# Patient Record
Sex: Female | Born: 1955 | Race: White | Hispanic: No | Marital: Married | Smoking: Never smoker
Health system: Southern US, Community
[De-identification: ages and names within clinical notes are randomized; demographics above are authoritative.]

## PROBLEM LIST (undated history)

## (undated) DIAGNOSIS — C50919 Malignant neoplasm of unspecified site of unspecified female breast: Secondary | ICD-10-CM

## (undated) DIAGNOSIS — R079 Chest pain, unspecified: Secondary | ICD-10-CM

## (undated) DIAGNOSIS — J45909 Unspecified asthma, uncomplicated: Secondary | ICD-10-CM

## (undated) DIAGNOSIS — Z86718 Personal history of other venous thrombosis and embolism: Secondary | ICD-10-CM

## (undated) DIAGNOSIS — Z98811 Dental restoration status: Secondary | ICD-10-CM

## (undated) DIAGNOSIS — Z973 Presence of spectacles and contact lenses: Secondary | ICD-10-CM

## (undated) DIAGNOSIS — R7303 Prediabetes: Secondary | ICD-10-CM

## (undated) DIAGNOSIS — M797 Fibromyalgia: Secondary | ICD-10-CM

## (undated) DIAGNOSIS — E669 Obesity, unspecified: Secondary | ICD-10-CM

## (undated) DIAGNOSIS — E559 Vitamin D deficiency, unspecified: Secondary | ICD-10-CM

## (undated) DIAGNOSIS — G4733 Obstructive sleep apnea (adult) (pediatric): Secondary | ICD-10-CM

## (undated) DIAGNOSIS — Z923 Personal history of irradiation: Secondary | ICD-10-CM

## (undated) DIAGNOSIS — K8020 Calculus of gallbladder without cholecystitis without obstruction: Secondary | ICD-10-CM

## (undated) DIAGNOSIS — K829 Disease of gallbladder, unspecified: Secondary | ICD-10-CM

## (undated) DIAGNOSIS — T7840XA Allergy, unspecified, initial encounter: Secondary | ICD-10-CM

## (undated) DIAGNOSIS — F419 Anxiety disorder, unspecified: Secondary | ICD-10-CM

## (undated) DIAGNOSIS — M069 Rheumatoid arthritis, unspecified: Secondary | ICD-10-CM

## (undated) DIAGNOSIS — Z85850 Personal history of malignant neoplasm of thyroid: Secondary | ICD-10-CM

## (undated) DIAGNOSIS — M25472 Effusion, left ankle: Secondary | ICD-10-CM

## (undated) DIAGNOSIS — Z87898 Personal history of other specified conditions: Secondary | ICD-10-CM

## (undated) DIAGNOSIS — M1990 Unspecified osteoarthritis, unspecified site: Secondary | ICD-10-CM

## (undated) DIAGNOSIS — I890 Lymphedema, not elsewhere classified: Secondary | ICD-10-CM

## (undated) DIAGNOSIS — C4491 Basal cell carcinoma of skin, unspecified: Secondary | ICD-10-CM

## (undated) DIAGNOSIS — R42 Dizziness and giddiness: Secondary | ICD-10-CM

## (undated) DIAGNOSIS — E039 Hypothyroidism, unspecified: Secondary | ICD-10-CM

## (undated) DIAGNOSIS — S8992XA Unspecified injury of left lower leg, initial encounter: Secondary | ICD-10-CM

## (undated) DIAGNOSIS — Z974 Presence of external hearing-aid: Secondary | ICD-10-CM

## (undated) DIAGNOSIS — R002 Palpitations: Secondary | ICD-10-CM

## (undated) DIAGNOSIS — K769 Liver disease, unspecified: Secondary | ICD-10-CM

## (undated) DIAGNOSIS — H269 Unspecified cataract: Secondary | ICD-10-CM

## (undated) DIAGNOSIS — Z972 Presence of dental prosthetic device (complete) (partial): Secondary | ICD-10-CM

## (undated) DIAGNOSIS — D649 Anemia, unspecified: Secondary | ICD-10-CM

## (undated) DIAGNOSIS — G4730 Sleep apnea, unspecified: Secondary | ICD-10-CM

## (undated) DIAGNOSIS — Q185 Microstomia: Secondary | ICD-10-CM

## (undated) DIAGNOSIS — N390 Urinary tract infection, site not specified: Secondary | ICD-10-CM

## (undated) DIAGNOSIS — E785 Hyperlipidemia, unspecified: Secondary | ICD-10-CM

## (undated) DIAGNOSIS — C801 Malignant (primary) neoplasm, unspecified: Secondary | ICD-10-CM

## (undated) DIAGNOSIS — R0602 Shortness of breath: Secondary | ICD-10-CM

## (undated) DIAGNOSIS — M67971 Unspecified disorder of synovium and tendon, right ankle and foot: Secondary | ICD-10-CM

## (undated) DIAGNOSIS — Z91018 Allergy to other foods: Secondary | ICD-10-CM

## (undated) DIAGNOSIS — I10 Essential (primary) hypertension: Secondary | ICD-10-CM

## (undated) DIAGNOSIS — Z8489 Family history of other specified conditions: Secondary | ICD-10-CM

## (undated) DIAGNOSIS — Z8719 Personal history of other diseases of the digestive system: Secondary | ICD-10-CM

## (undated) DIAGNOSIS — I509 Heart failure, unspecified: Secondary | ICD-10-CM

## (undated) DIAGNOSIS — H9319 Tinnitus, unspecified ear: Secondary | ICD-10-CM

## (undated) DIAGNOSIS — H409 Unspecified glaucoma: Secondary | ICD-10-CM

## (undated) DIAGNOSIS — G43909 Migraine, unspecified, not intractable, without status migrainosus: Secondary | ICD-10-CM

## (undated) DIAGNOSIS — J189 Pneumonia, unspecified organism: Secondary | ICD-10-CM

## (undated) DIAGNOSIS — F32A Depression, unspecified: Secondary | ICD-10-CM

## (undated) DIAGNOSIS — Z8711 Personal history of peptic ulcer disease: Secondary | ICD-10-CM

## (undated) DIAGNOSIS — K219 Gastro-esophageal reflux disease without esophagitis: Secondary | ICD-10-CM

## (undated) DIAGNOSIS — Z9221 Personal history of antineoplastic chemotherapy: Secondary | ICD-10-CM

## (undated) DIAGNOSIS — F329 Major depressive disorder, single episode, unspecified: Secondary | ICD-10-CM

## (undated) DIAGNOSIS — J449 Chronic obstructive pulmonary disease, unspecified: Secondary | ICD-10-CM

## (undated) DIAGNOSIS — K224 Dyskinesia of esophagus: Secondary | ICD-10-CM

## (undated) DIAGNOSIS — M25471 Effusion, right ankle: Secondary | ICD-10-CM

## (undated) DIAGNOSIS — M2550 Pain in unspecified joint: Secondary | ICD-10-CM

## (undated) HISTORY — PX: KNEE ARTHROSCOPY: SUR90

## (undated) HISTORY — DX: Gastro-esophageal reflux disease without esophagitis: K21.9

## (undated) HISTORY — DX: Depression, unspecified: F32.A

## (undated) HISTORY — DX: Shortness of breath: R06.02

## (undated) HISTORY — PX: TUMOR EXCISION: SHX421

## (undated) HISTORY — PX: BREAST LUMPECTOMY: SHX2

## (undated) HISTORY — DX: Unspecified osteoarthritis, unspecified site: M19.90

## (undated) HISTORY — DX: Personal history of peptic ulcer disease: Z87.11

## (undated) HISTORY — DX: Vitamin D deficiency, unspecified: E55.9

## (undated) HISTORY — DX: Effusion, left ankle: M25.472

## (undated) HISTORY — DX: Allergy, unspecified, initial encounter: T78.40XA

## (undated) HISTORY — DX: Unspecified glaucoma: H40.9

## (undated) HISTORY — PX: ACHILLES TENDON REPAIR: SUR1153

## (undated) HISTORY — PX: ORIF TOE FRACTURE: SHX5032

## (undated) HISTORY — DX: Unspecified disorder of synovium and tendon, right ankle and foot: M67.971

## (undated) HISTORY — DX: Tinnitus, unspecified ear: H93.19

## (undated) HISTORY — DX: Allergy to other foods: Z91.018

## (undated) HISTORY — DX: Calculus of gallbladder without cholecystitis without obstruction: K80.20

## (undated) HISTORY — DX: Rheumatoid arthritis, unspecified: M06.9

## (undated) HISTORY — DX: Anxiety disorder, unspecified: F41.9

## (undated) HISTORY — DX: Personal history of other diseases of the digestive system: Z87.19

## (undated) HISTORY — DX: Disease of gallbladder, unspecified: K82.9

## (undated) HISTORY — DX: Sleep apnea, unspecified: G47.30

## (undated) HISTORY — DX: Malignant (primary) neoplasm, unspecified: C80.1

## (undated) HISTORY — DX: Anemia, unspecified: D64.9

## (undated) HISTORY — PX: CARPAL TUNNEL RELEASE: SHX101

## (undated) HISTORY — PX: BREAST EXCISIONAL BIOPSY: SUR124

## (undated) HISTORY — DX: Personal history of other venous thrombosis and embolism: Z86.718

## (undated) HISTORY — DX: Pain in unspecified joint: M25.50

## (undated) HISTORY — PX: TUBAL LIGATION: SHX77

## (undated) HISTORY — DX: Lymphedema, not elsewhere classified: I89.0

## (undated) HISTORY — DX: Liver disease, unspecified: K76.9

## (undated) HISTORY — DX: Hyperlipidemia, unspecified: E78.5

## (undated) HISTORY — DX: Obstructive sleep apnea (adult) (pediatric): G47.33

## (undated) HISTORY — DX: Unspecified injury of left lower leg, initial encounter: S89.92XA

## (undated) HISTORY — DX: Unspecified asthma, uncomplicated: J45.909

## (undated) HISTORY — PX: LIGAMENT REPAIR: SHX5444

## (undated) HISTORY — DX: Basal cell carcinoma of skin, unspecified: C44.91

## (undated) HISTORY — DX: Unspecified cataract: H26.9

## (undated) HISTORY — DX: Major depressive disorder, single episode, unspecified: F32.9

## (undated) HISTORY — DX: Essential (primary) hypertension: I10

## (undated) HISTORY — DX: Prediabetes: R73.03

## (undated) HISTORY — DX: Palpitations: R00.2

## (undated) HISTORY — DX: Obesity, unspecified: E66.9

## (undated) HISTORY — DX: Chest pain, unspecified: R07.9

## (undated) HISTORY — DX: Chronic obstructive pulmonary disease, unspecified: J44.9

## (undated) HISTORY — DX: Heart failure, unspecified: I50.9

## (undated) HISTORY — DX: Effusion, right ankle: M25.471

---

## 1988-05-22 HISTORY — PX: CHOLECYSTECTOMY: SHX55

## 1998-05-22 HISTORY — PX: THYROIDECTOMY: SHX17

## 1998-05-22 HISTORY — PX: TONSILLECTOMY AND ADENOIDECTOMY: SHX28

## 2002-05-22 HISTORY — PX: INCONTINENCE SURGERY: SHX676

## 2002-05-22 HISTORY — PX: APPENDECTOMY: SHX54

## 2002-05-22 HISTORY — PX: ABDOMINAL HYSTERECTOMY: SHX81

## 2009-05-22 HISTORY — PX: BREAST SURGERY: SHX581

## 2009-06-22 HISTORY — PX: COLONOSCOPY W/ POLYPECTOMY: SHX1380

## 2009-08-27 LAB — HM COLONOSCOPY

## 2009-11-29 ENCOUNTER — Ambulatory Visit: Payer: Self-pay | Admitting: Diagnostic Radiology

## 2009-11-29 ENCOUNTER — Emergency Department (HOSPITAL_BASED_OUTPATIENT_CLINIC_OR_DEPARTMENT_OTHER): Admission: EM | Admit: 2009-11-29 | Discharge: 2009-11-29 | Payer: Self-pay | Admitting: Emergency Medicine

## 2009-12-01 ENCOUNTER — Emergency Department (HOSPITAL_BASED_OUTPATIENT_CLINIC_OR_DEPARTMENT_OTHER): Admission: EM | Admit: 2009-12-01 | Discharge: 2009-12-01 | Payer: Self-pay | Admitting: Emergency Medicine

## 2009-12-01 ENCOUNTER — Ambulatory Visit: Payer: Self-pay | Admitting: Family

## 2009-12-01 ENCOUNTER — Telehealth: Payer: Self-pay | Admitting: Family

## 2009-12-01 DIAGNOSIS — F329 Major depressive disorder, single episode, unspecified: Secondary | ICD-10-CM

## 2009-12-20 ENCOUNTER — Ambulatory Visit: Payer: Self-pay | Admitting: Family

## 2009-12-20 ENCOUNTER — Encounter (INDEPENDENT_AMBULATORY_CARE_PROVIDER_SITE_OTHER): Payer: Self-pay | Admitting: *Deleted

## 2009-12-20 DIAGNOSIS — E890 Postprocedural hypothyroidism: Secondary | ICD-10-CM

## 2009-12-20 DIAGNOSIS — E663 Overweight: Secondary | ICD-10-CM | POA: Insufficient documentation

## 2009-12-20 DIAGNOSIS — K219 Gastro-esophageal reflux disease without esophagitis: Secondary | ICD-10-CM

## 2009-12-21 ENCOUNTER — Encounter: Payer: Self-pay | Admitting: Family

## 2009-12-21 ENCOUNTER — Encounter (INDEPENDENT_AMBULATORY_CARE_PROVIDER_SITE_OTHER): Payer: Self-pay | Admitting: *Deleted

## 2009-12-21 LAB — CONVERTED CEMR LAB
ALT: 20 units/L (ref 0–35)
AST: 18 units/L (ref 0–37)
Alkaline Phosphatase: 56 units/L (ref 39–117)
Basophils Absolute: 0 10*3/uL (ref 0.0–0.1)
Bilirubin, Direct: 0.1 mg/dL (ref 0.0–0.3)
Calcium: 9.4 mg/dL (ref 8.4–10.5)
Cholesterol: 237 mg/dL — ABNORMAL HIGH (ref 0–200)
Creatinine, Ser: 0.73 mg/dL (ref 0.40–1.20)
Eosinophils Absolute: 0.1 10*3/uL (ref 0.0–0.7)
Eosinophils Relative: 2 % (ref 0–5)
Glucose, Bld: 96 mg/dL (ref 70–99)
HCT: 43.3 % (ref 36.0–46.0)
Lymphs Abs: 2.2 10*3/uL (ref 0.7–4.0)
MCV: 80.3 fL (ref 78.0–100.0)
Monocytes Absolute: 0.5 10*3/uL (ref 0.1–1.0)
Platelets: 275 10*3/uL (ref 150–400)
RDW: 15.4 % (ref 11.5–15.5)
Sodium: 139 meq/L (ref 135–145)
TSH: 0.765 microintl units/mL (ref 0.350–4.500)
Total CHOL/HDL Ratio: 3.5

## 2009-12-22 ENCOUNTER — Encounter: Payer: Self-pay | Admitting: Family

## 2010-01-05 ENCOUNTER — Encounter: Admission: RE | Admit: 2010-01-05 | Discharge: 2010-02-04 | Payer: Self-pay | Admitting: Orthopedic Surgery

## 2010-01-12 ENCOUNTER — Ambulatory Visit: Payer: Self-pay | Admitting: Family

## 2010-01-17 ENCOUNTER — Telehealth: Payer: Self-pay | Admitting: Family

## 2010-01-25 ENCOUNTER — Telehealth: Payer: Self-pay | Admitting: Family

## 2010-01-25 ENCOUNTER — Encounter: Payer: Self-pay | Admitting: Family

## 2010-03-14 ENCOUNTER — Ambulatory Visit: Payer: Self-pay | Admitting: Family

## 2010-03-16 ENCOUNTER — Telehealth: Payer: Self-pay | Admitting: Family

## 2010-03-22 ENCOUNTER — Ambulatory Visit: Payer: Self-pay | Admitting: Family

## 2010-03-22 ENCOUNTER — Ambulatory Visit: Payer: Self-pay | Admitting: Diagnostic Radiology

## 2010-03-22 ENCOUNTER — Ambulatory Visit (HOSPITAL_BASED_OUTPATIENT_CLINIC_OR_DEPARTMENT_OTHER): Admission: RE | Admit: 2010-03-22 | Discharge: 2010-03-22 | Payer: Self-pay | Admitting: Internal Medicine

## 2010-03-22 DIAGNOSIS — R609 Edema, unspecified: Secondary | ICD-10-CM

## 2010-03-22 DIAGNOSIS — J45909 Unspecified asthma, uncomplicated: Secondary | ICD-10-CM

## 2010-05-10 ENCOUNTER — Ambulatory Visit: Payer: Self-pay | Admitting: Family

## 2010-05-10 DIAGNOSIS — M76899 Other specified enthesopathies of unspecified lower limb, excluding foot: Secondary | ICD-10-CM | POA: Insufficient documentation

## 2010-05-10 DIAGNOSIS — M797 Fibromyalgia: Secondary | ICD-10-CM | POA: Insufficient documentation

## 2010-05-10 DIAGNOSIS — IMO0001 Reserved for inherently not codable concepts without codable children: Secondary | ICD-10-CM

## 2010-05-12 ENCOUNTER — Ambulatory Visit: Payer: Self-pay | Admitting: Family

## 2010-05-12 ENCOUNTER — Encounter: Payer: Self-pay | Admitting: Family Medicine

## 2010-06-06 ENCOUNTER — Telehealth: Payer: Self-pay | Admitting: Family

## 2010-06-21 NOTE — Assessment & Plan Note (Signed)
Summary: new pt right hip pain recent ER visit/dt--rm 4   Vital Signs:  Patient profile:   55 year old female Height:      67.75 inches Weight:      250.75 pounds BMI:     38.55 Temp:     97.8 degrees F oral Pulse rate:   72 / minute Pulse rhythm:   regular Resp:     16 per minute BP sitting:   128 / 84  (right arm) Cuff size:   large  Vitals Entered By: Mervin Kung CMA Duncan Dull) (December 01, 2009 1:14 PM) Is Patient Diabetic? No Pain Assessment Patient in pain? yes     Location: right pain Intensity: 8 Type: throbbing pain Onset of pain  11/29/09   History of Present Illness: Michelle Kennedy is a 55 year old female who presents today to establish care.  She was seen yesterday at the [facility] ED for severe R hip pain.   1) Hip pain- Patient notes that she was given an rx for  percocet in the ED which has brought pain down from 10+ to a 7 or 8.   x-ray was negative for fracture or bony changes. Pain is made worse by movement.  Improved some with percocet and a heating pad.  Reports that she has history of bursitis which was treated by her Orthopedist in [place] by steroid injection 4 months ago.  She had significant relief after this injection. She moved here 5 weeks ago.  2) Depression- Notes that her depression is better recently.  Prior to her recent move, her husband was unemployed for 2 yrs which place a lot of pressure on the patient.  Both of her children were [place] "in a war zone."         Preventive Screening-Counseling & Management  Alcohol-Tobacco     Smoking Status: never  Caffeine-Diet-Exercise     Does Patient Exercise: yes     Type of exercise: walking     Exercise (avg: min/session): 30-60     Times/week: 3      Drug Use:  no.    Allergies (verified): 1)  ! Iodine 2)  ! * Shellfish 3)  ! Pcn 4)  ! Erythromycin 5)  ! Codeine 6)  ! * Contrast Dye  Past History:  Past Medical  History: Asthma Arthritis Depression diverticulitis(colonoscopy)--06/2009 Migraines GERD Hx of Esophageal Ulcer HTN Allergies Hypercholesterolemia colon Polyps Thyroid Cancer--2000 Urinary Incontinence UTIs  Past Surgical History: Cholecystectomy--1990 Left Breast Biopsy--2001 Appendectomy--2004 Tonsillectomy/addenoidectomy--2000 hysterectomy & bladder sling--2004 Thyroidectomy--2000 colonscopy / polypectomy--06/2009  Family History: Alcoholism--parents, paternal grandfather Arthritis--mother, materna grandmother Ovarian Cancer--mother Breast Cancer--mother,deceased Lung Cancer--father,deceased Hypercholesterolemia--father HTN--mother Kidney Disease--father Diabetes--father, paternal grandmother Bipolar--mother  Social History: 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 No apparent alopecia or balding. Lungs:  Normal respiratory effort, chest expands symmetrically. Lungs are clear to auscultation, no crackles or wheezes. Heart:  Normal rate and regular rhythm. S1 and S2 normal without gallop, murmur, click, rub or other extra sounds. Msk:  + full range of motion of right hip, pain most severe with r hip flexion   Impression & Recommendations:  Problem # 1:  BURSITIS,  RIGHT HIP (ICD-726.5) Assessment Deteriorated Referral made to [facility] and arrangements were made for the patient to see them this afternoon for evaluation.  Recommended Aleve as needed.  Pt to return fasting next visit for a full physical.  Problem # 2:  DEPRESSION (ICD-311) Assessment: Improved Pt reports that her  depression has improved since some of the stress in her life has improved.  Not currently on meds.   Monitor.  Complete Medication List: 1)  Calcium 500 Mg Tabs (Calcium) .... Take 1 tablet by mouth two times a day 2)  Lyrica 75 Mg Caps (Pregabalin) .... Take 1 capsule by mouth two times a day 3)  Advair Diskus 250-50 Mcg/dose Aepb (Fluticasone-salmeterol) .Marland Kitchen.. 1 inhalation twice a day 4)  Albuterol Inhaler  .... As needed. 5)  Levothyroxine Sodium 200 Mcg Solr (Levothyroxine sodium) .... Take 1 tablet by mouth once a day 6)  Simvastatin 20 Mg Tabs (Simvastatin) .... Take 1 tablet by mouth once a day 7)  Epi Pen  .... As needed 8)  Loratadine 10 Mg Tabs (Loratadine) .... As needed 9)  Zolpidem Tartrate 5 Mg Tabs (Zolpidem tartrate) .... Take 1 tablet by mouth once a day  Patient Instructions: 1)  You will be contacted about your appointment with [facility] (ortho) on the second floor.  Their number is [phone] 2)  Please return fasting in 1 month for a complete physical.   3)  Welcome to [facility]!   Vital Signs:  Patient Profile:   55 year old female Height:     67.75 inches Weight:      250.75 pounds BMI:     38.55 Temp:     97.8 degrees F oral Pulse rate:   72 / minute Pulse rhythm:   regular Resp:     16 per minute BP sitting:   128 / 84 Cuff size:   large    Location:   right pain    Intensity:   8    Type:       throbbing pain                   Preventive Care Screening  Colonoscopy:    Date:  08/27/2009    Results:  Hyperplastic Polyp   Mammogram:    Date:  05/22/2009    Results:  normal   Pap Smear:    Date:  05/22/2009    Results:  normal   Last Tetanus Booster:    Date:  03/22/2009    Results:  Historical     Immunization History:  Pneumovax Immunization History:    Pneumovax:  historical (03/22/2009)   Current Allergies (reviewed today): ! IODINE ! * SHELLFISH ! PCN ! ERYTHROMYCIN ! CODEINE ! * CONTRAST DYE

## 2010-06-21 NOTE — Letter (Signed)
   [place] [facility] [address] [place], [place]  [address]  [place] Phone: [phone]      December 22, 2009   LAJOYA DOMBEK [address] [place], [place] [address]  RE:  LAB RESULTS  Dear  Ms. Basurto,  The following is an interpretation of your most recent lab tests.  Please take note of any instructions provided or changes to medications that have resulted from your lab work.  ELECTROLYTES:  Good - no changes needed  KIDNEY FUNCTION TESTS:  Good - no changes needed  LIVER FUNCTION TESTS:  Good - no changes needed  LIPID PANEL:  Please see the enclosed Rx for a change in medication Triglyceride: 101   Cholesterol: 237   LDL: 149   HDL: 68   Chol/HDL%:  3.5 Ratio  THYROID STUDIES:  Thyroid studies normal TSH: 0.765    Cholesterol is still a little high, I would like you to increase your simvastatin.  Please follow up in 3 months. Call if you develop muscle pain.  Prescription has been sent to your pharmacy.   Medications Prescribed or Changed SIMVASTATIN 40 MG TABS (SIMVASTATIN) one tablet by mouth daily   Sincerely Yours,    [name] FNP  Appended Document:  Mailed.

## 2010-06-21 NOTE — Progress Notes (Signed)
Summary: Rx refill to [facility]  Phone Note Refill Request Call back at Home Phone [phone] Message from:  Patient on January 17, 2010 4:05 PM  Refills Requested: Medication #1:  LYRICA 75 MG CAPS Take 1 capsule by mouth two times a day  Medication #2:  ADVAIR DISKUS 250-50 MCG/DOSE AEPB 1 inhalation twice a day  Medication #3:  ALBUTEROL INHALER as needed.  Medication #4:  LEVOTHYROXINE SODIUM 200 MCG SOLR Take 1 tablet by mouth once a day Pls send Rx to mail order [facility] Pls also  include  Loratadine, Moexipril, Cymbalta, Premarin, Tramadol, Zolpidem, Zomig,  Simvastatin  Initial call taken by: Lannette Donath,  January 17, 2010 4:06 PM  Follow-up for Phone Call        Is it ok to given 90 day supply of Tramadol, Lyrica and Zolpidem? Nicki Guadalajara Fergerson CMA Duncan Dull)  January 17, 2010 4:12 PM   Additional Follow-up for Phone Call Additional follow up Details #1::        No these are controlled substances.  By law I can only provide 30 days at a time with no refills on these meds. Patient is aware. [facility] [address]   Additional Follow-up by: [name] FNP,  January 17, 2010 4:29 PM    Additional Follow-up for Phone Call Additional follow up Details #2::    Tramadol, Lyrica and [facility]pidem [facility] [address] for [facility] day supply x no refills.  All other meds re-sent to [facility] via fax as electronic method failed. Nicki Guadalajara Fergerson CMA (AAMA)  January 18, 2010 8:19 AM   New/Updated Medications: LYRICA 75 MG CAPS (PREGABALIN) Take 1 capsule by mouth two times a daily as needed for pain ZOLPIDEM TARTRATE 10 MG TABS (ZOLPIDEM TARTRATE) to be taken at bedtime as needed for sleep TRAMADOL HCL 100 MG XR24H-TAB (TRAMADOL HCL) once daily as needed for pain VENTOLIN HFA 108 (90 BASE) MCG/ACT AERS (ALBUTEROL SULFATE) 2 puffs every 6 hours as needed Prescriptions: ALBUTEROL INHALER as needed.  #2 x 1   Entered by:   Mervin Kung CMA (AAMA)  Authorized by:   [name] FNP   Signed by:   Mervin Kung CMA (AAMA) on 01/18/2010   Method used:   Faxed to ...       [facility] [address] Environmental education officer)       [facility]       [address]       [place], [place]  [address]       Ph: [phone]       Fax: [phone]   RxID:   [id number] LORATADINE 10 MG TABS (LORATADINE) as needed  #90 x 1   Entered by:   Mervin Kung CMA (AAMA)   Authorized by:   [name] FNP   Signed by:   Mervin Kung CMA (AAMA) on 01/18/2010   Method used:   Faxed to ...       [facility] [address] Environmental education officer)       [facility]       [address]       [place], [place]  [address]       Ph: [phone]       Fax: [phone]   RxID:   [id number] SIMVASTATIN 40 MG TABS (SIMVASTATIN) one tablet by mouth daily  #90 x 1   Entered by:   Mervin Kung CMA (AAMA)   Authorized by:  [name] FNP   Signed by:   Mervin Kung CMA (AAMA) on 01/18/2010   Method used:   Faxed to ...       [facility] [address] Environmental education officer)       [facility]       [address]       [place], [place]  [address]       Ph: [phone]       Fax: [phone]   RxID:   [id number] ZOMIG 5 MG TABS (ZOLMITRIPTAN) as needed for migraines  #18 x 0   Entered by:   Mervin Kung CMA (AAMA)   Authorized by:   [name] FNP   Signed by:   Mervin Kung CMA (AAMA) on 01/18/2010   Method used:   Faxed to ...       [facility] [address] Environmental education officer)       [facility]       [address]       [place], [place]  [address]       Ph: [phone]       Fax: [phone]   RxID:   [id number] VENTOLIN HFA 108 (90 BASE) MCG/ACT AERS (ALBUTEROL SULFATE) 2 puffs every 6 hours as needed  #3 x 1   Entered by:   Mervin Kung CMA (AAMA)   Authorized by:   [name] FNP   Signed by:   Mervin Kung CMA (AAMA) on  01/18/2010   Method used:   Faxed to ...       [facility] [address] Environmental education officer)       [facility]       [address]       [place], [place]  [address]       Ph: [phone]       Fax: [phone]   RxID:   [id number] PREMARIN 0.625 MG TABS (ESTROGENS CONJUGATED) Take 1 tablet by mouth once a day  #90 x 1   Entered by:   Mervin Kung CMA (AAMA)   Authorized by:   [name] FNP   Signed by:   Mervin Kung CMA (AAMA) on 01/18/2010   Method used:   Faxed to ...       [facility] [address] Environmental education officer)       [facility]       [address]       [place], [place]  [address]       Ph: [phone]       Fax: [phone]   RxID:   [id number] CYMBALTA 60 MG CPEP (DULOXETINE HCL) Take 1 capsule by mouth once a day  #90 x 1   Entered by:   Mervin Kung CMA (AAMA)   Authorized by:   [name] FNP   Signed by:   Mervin Kung CMA (AAMA) on 01/18/2010   Method used:   Faxed to ...       [facility] [address] Environmental education officer)       [facility]       [address]       [place], [place]  [address]       Ph: [phone]       Fax: [phone]   RxID:   [id number] MOEXIPRIL HCL 15 MG TABS (MOEXIPRIL HCL) Take 1 tablet by mouth once a day  #90 x 1   Entered by:   Mervin Kung CMA (AAMA)  Authorized by:   [name] FNP   Signed by:   Mervin Kung CMA (AAMA) on 01/18/2010   Method used:   Faxed to ...       [facility] [address] Environmental education officer)       [facility]       [address]       [place], [place]  [address]       Ph: [phone]       Fax: [phone]   RxID:   [id number] LEVOTHYROXINE SODIUM 200 MCG SOLR (LEVOTHYROXINE SODIUM) Take 1 tablet by mouth once a day  #90 x 1   Entered by:   Mervin Kung CMA (AAMA)   Authorized by:   [name] FNP   Signed by:   Mervin Kung CMA (AAMA) on 01/18/2010   Method used:    Faxed to ...       [facility] [address] Environmental education officer)       [facility]       [address]       [place], [place]  [address]       Ph: [phone]       Fax: [phone]   RxID:   [id number] ADVAIR DISKUS 250-50 MCG/DOSE AEPB (FLUTICASONE-SALMETEROL) 1 inhalation twice a day  #3 x 1   Entered by:   Mervin Kung CMA (AAMA)   Authorized by:   [name] FNP   Signed by:   Mervin Kung CMA (AAMA) on 01/18/2010   Method used:   Faxed to ...       [facility] [address] Environmental education officer)       [facility]       [address]       [place], [place]  [address]       Ph: [phone]       Fax: [phone]   RxID:   [id number] VENTOLIN HFA 108 (90 BASE) MCG/ACT AERS (ALBUTEROL SULFATE) 2 puffs every 6 hours as needed  #3 x 1   Entered and Authorized by:   [name] FNP   Signed by:   [name] FNP on 01/17/2010   Method used:   Electronically to        [facility] ([facility])             , [place]         Ph: [phone]       Fax: [phone]   RxID:   [id number] ADVAIR DISKUS 250-50 MCG/DOSE AEPB (FLUTICASONE-SALMETEROL) 1 inhalation twice a day  #3 x 1   Entered and Authorized by:   [name] FNP   Signed by:   [name] FNP on 01/17/2010   Method used:   Electronically to        [facility] (mail[facility])             , [place]         Ph: [phone]       Fax: [phone]   RxID:   [id number] ZOMIG 5 MG TABS (ZOLMITRIPTAN) as needed for migraines  #18 x 0   Entered and Authorized by:   [name] FNP   Signed by:   [name] FNP on 01/17/2010   Method used:   Electronically to        [facility] (mail[facility])             , [place]  Ph: [phone]       Fax: [phone]   RxID:   [id number] PREMARIN 0.625 MG TABS (ESTROGENS CONJUGATED) Take 1 tablet by mouth once a day  #90 x 1   Entered and Authorized by:   [name] FNP   Signed by:   [name] FNP on 01/17/2010   Method used:   Electronically to        [facility] (mail[facility]order)             , [place]         Ph: [phone]       Fax: [phone]   RxID:   [id number] MOEXIPRIL HCL 15 MG TABS (MOEXIPRIL HCL) Take 1 tablet by mouth once a day  #90 x 1   Entered and Authorized by:   [name] FNP   Signed by:   [name] FNP on 01/17/2010   Method used:   Electronically to        [facility] (mail[facility]order)             , [place]         Ph: [phone]       Fax: [phone]   RxID:   [id number] CYMBALTA 60 MG CPEP (DULOXETINE HCL) Take 1 capsule by mouth once a day  #90 x 1   Entered and Authorized by:   [name] FNP   Signed by:   [name] FNP on 01/17/2010   Method used:   Electronically to        [facility] (mail[facility])             , [place]         Ph: [phone]       Fax: [phone]   RxID:   [id number] LEVOTHYROXINE SODIUM 200 MCG SOLR (LEVOTHYROXINE SODIUM) Take 1 tablet by mouth once a day  #90 x 1   Entered and Authorized by:   [name] FNP   Signed by:   [name] FNP on 01/17/2010   Method used:   Electronically to        [facility] (mail[facility]order)             , [place]         Ph: [phone]       Fax: [phone]   RxID:   [id number] LYRICA 75 MG CAPS (PREGABALIN) Take 1 capsule by mouth two times a daily as needed for pain  #60 x 0   Entered and Authorized by:   [name] FNP   Signed by:   [name] FNP on 01/17/2010   Method used:   Printed then faxed [facility] ...       [facility] [address] [phone][facility] (retail)       [address] [place], [place]  [address]       Ph: [phone]       Fax: [phone]   RxID:   [id number] ZOLPIDEM TARTRATE 10 MG TABS (ZOLPIDEM TARTRATE) to be taken at bedtime as needed for sleep  #30 x 0   Entered and Authorized by:   [name] FNP   Signed by:    [name] FNP on 01/17/2010   Method used:   Printed then faxed to ...       [facility] [address] [facility] [phone][facility] (retail)       [phone] [address]  [place], [place]  [address]       Ph: [phone]       Fax: [phone]   RxID:   [id number] TRAMADOL HCL 100 MG XR24H-TAB (TRAMADOL HCL) once daily as needed for pain  #30 x 0   Entered and Authorized by:   [name] FNP   Signed by:   [name] FNP on 01/17/2010   Method used:   Printed then faxed to ...       [facility] [address] (retail)       [address] [place], [place]  [address]       Ph: [phone]       Fax: [phone]   RxID:   [id number]

## 2010-06-21 NOTE — Assessment & Plan Note (Signed)
Summary: 3 MONTH FOLLOW UP/MHF--Rm 5   Vital Signs:  Patient profile:   55 year old female Height:      67.75 inches Weight:      255.25 pounds BMI:     39.24 O2 Sat:      99 % on Room air Temp:     97.4 degrees F oral Pulse rate:   60 / minute Pulse rhythm:   regular Resp:     18 per minute BP sitting:   120 / 90  (right arm) Cuff size:   large  Vitals Entered By: Mervin Kung CMA Duncan Dull) (March 22, 2010 10:38 AM)  O2 Flow:  Room air  Serial Vital Signs/Assessments:  Comments: 10:58 AM Ambulatory Pulse Oximetry  Resting; HR_78____    02 Sat___99__  Lap1 (185 feet)   HR__87___   02 Sat__97___ Lap2 (185 feet)   HR_____   02 Sat_____    Lap3 (185 feet)   HR_____   02 Sat_____  ___Test Completed without Difficulty Mervin Kung CMA (AAMA)  March 22, 2010 10:58 AM     By: Mervin Kung CMA (AAMA)   CC: Rm 5  3 month follow up. Pt has completed antibiotic. Still has chest tightness and sinus drainage. States O2 has been dropping to the 80s at night and when she is active. Is Patient Diabetic? No Pain Assessment Patient in pain? no      Comments Wants to know if she can take Biodentical Hormones to replace Premarin. Nicki Guadalajara Fergerson CMA Duncan Dull)  March 22, 2010 10:48 AM    Primary Care Provider:  [name] FNP  CC:  Rm 5  3 month follow up. Pt has completed antibiotic. Still has chest tightness and sinus drainage. States O2 has been dropping to the 80s at night and when she is active.Marland Kitchen  History of Present Illness: Michelle Kennedy is a 55 year old female who presents with complaint of hypoxia on saturday- saturday she noted wheezing.  Sats high 80's at rest.  Used the nebulizer.  Denies fever.  + chest congestion but cough is non-productive.  energy is fair.  Ear is feeling better.  Feels achey. Wonders if her FM is "kicking up." Chest feels tight with breathing.  She describes this tightness as a respiratory tightness. Denies current shortness of  breath or chest pain.   At the end of the visit, patient also tells me that she has had a 3 day history of LLE edema.    Allergies: 1)  ! Iodine 2)  ! * Shellfish 3)  ! Pcn 4)  ! Erythromycin 5)  ! Codeine 6)  ! * Contrast Dye 7)  ! Talwin 8)  ! Lidocaine 9)  ! Asa 10)  ! * Latex 11)  ! Sulfa 12)  ! * Bee Sting  Past History:  Past Medical History: Last updated: 12/01/2009 Asthma Arthritis Depression diverticulitis(colonoscopy)--06/2009 Migraines GERD Hx of Esophageal Ulcer HTN Allergies Hypercholesterolemia colon Polyps Thyroid Cancer--2000 Urinary Incontinence UTIs  Past Surgical History: Last updated: 12/01/2009 Cholecystectomy--1990 Left Breast Biopsy--2001 Appendectomy--2004 Tonsillectomy/addenoidectomy--2000 hysterectomy & bladder sling--2004 Thyroidectomy--2000 colonscopy / polypectomy--06/2009  Review of Systems       see HPI  Physical Exam  General:  Well-developed,well-nourished,in no acute distress; alert,appropriate and cooperative throughout examination Head:  Normocephalic and atraumatic without obvious abnormalities. No apparent alopecia or balding. Ears:  Bilateral serous effusions without bulging or erythema Lungs:  Normal respiratory effort, chest expands symmetrically. Lungs are clear to auscultation, no  crackles or wheezes. Heart:  Normal rate and regular rhythm. S1 and S2 normal without gallop, murmur, click, rub or other extra sounds. Extremities:  + swelling of LLE and left knee.  Swelling extends up left thigh   Impression & Recommendations:  Problem # 1:  ASTHMA (ICD-493.90) Assessment Improved Breathing has improved.  Sats stable today, even with ambulation.  CXR negative.   Her updated medication list for this problem includes:    Advair Diskus 250-50 Mcg/dose Aepb (Fluticasone-salmeterol) .Marland Kitchen... 1 inhalation twice a day    Ventolin Hfa 108 (90 Base) Mcg/act Aers (Albuterol sulfate) .Marland Kitchen... 2 puffs every 6 hours as needed     Albuterol Sulfate (5 Mg/ml) 0.5% Nebu (Albuterol sulfate) ..... One nebulizer every 6 hours as needed for wheezing  Problem # 2:  EDEMA (ICD-782.3) Assessment: New Patient with new LLE edema.  LLE doppler was performed and is negative for DVT.  D. Dimer is pending, but I suspect this will be WNL.   Orders: T-D-Dimer [facility]) [phone]) Misc. Referral (Misc. Ref)  Complete Medication List: 1)  Calcium 500 Mg Tabs (Calcium) .... Take 1 tablet by mouth two times a day 2)  Lyrica 75 Mg Caps (Pregabalin) .... Take 1 capsule by mouth two times a daily as needed for pain 3)  Advair Diskus 250-50 Mcg/dose Aepb (Fluticasone-salmeterol) .Marland Kitchen.. 1 inhalation twice a day 4)  Albuterol Inhaler  .... As needed. 5)  Levothyroxine Sodium 200 Mcg Solr (Levothyroxine sodium) .... Take 1 tablet by mouth once a day 6)  Simvastatin 40 Mg Tabs (Simvastatin) .... One tablet by mouth daily 7)  Epi Pen  .... As needed 8)  Loratadine 10 Mg Tabs (Loratadine) .... As needed 9)  Zolpidem Tartrate 10 Mg Tabs (Zolpidem tartrate) .... To be taken at bedtime as needed for sleep 10)  Fish Oil 1200 Mg Caps (Omega-3 fatty acids) .... Take 1 capsule by mouth once a day 11)  Daily Multi Tabs (Multiple vitamins-minerals) .... Take 1 tablet by mouth once a day 12)  Vitamin D 1000 Unit Tabs (Cholecalciferol) .... Take 1 tablet by mouth once a day 13)  Prilosec Otc 20 Mg Tbec (Omeprazole magnesium) .... Take 1 tablet by mouth two times a day 14)  Moexipril Hcl 15 Mg Tabs (Moexipril hcl) .... Take 1 tablet by mouth once a day 15)  Premarin 0.3 Mg Tabs (Estrogens conjugated) .... One tablet by mouth daily for 1 month then stop 16)  Tramadol Hcl 100 Mg Xr24h-tab (Tramadol hcl) .... Once daily as needed for pain 17)  Ventolin Hfa 108 (90 Base) Mcg/act Aers (Albuterol sulfate) .... 2 puffs every 6 hours as needed 18)  Naratriptan Hcl 2.5 Mg Tabs (Naratriptan hcl) .... One tablet at start of migraine, may repeat in 4 hours if symptoms  persist or headache returns. 19)  Albuterol Sulfate (5 Mg/ml) 0.5% Nebu (Albuterol sulfate) .... One nebulizer every 6 hours as needed for wheezing  Other Orders: CXR- 2view (CXR)  Patient Instructions: 1)  Please complete your x-ray, ultrasound and labs downstairs today.   Orders Added: 1)  CXR- 2view [CXR] 2)  T-D-Dimer [facility]) [[id number]] 3)  Misc. Referral [Misc. Ref] 4)  Est. Patient Level III [[address]]    Current Allergies (reviewed today): ! IODINE ! * SHELLFISH ! PCN ! ERYTHROMYCIN ! CODEINE ! * CONTRAST DYE ! TALWIN ! LIDOCAINE ! ASA ! * LATEX ! SULFA ! * BEE STING  Appended Document: 3 MONTH FOLLOW UP/MHF--Rm 5 Spoke with patient about coming  off of HRT.  Plan to decrease dose this month then discontinue.

## 2010-06-21 NOTE — Assessment & Plan Note (Signed)
Summary: ear ache/mhf--Rm 4   Vital Signs:  Patient profile:   54 year old female Height:      67.75 inches Temp:     98.0 degrees F oral Pulse rate:   84 / minute Pulse rhythm:   regular Resp:     18 per minute BP sitting:   126 / 84  (right arm) Cuff size:   large  Vitals Entered By: Mervin Kung CMA Duncan Dull) (March 14, 2010 8:37 AM) CC: Rm 4  Pt has left ear pain since Friday evening Is Patient Diabetic? No Pain Assessment Patient in pain? yes     Location: left ear Intensity: 7 Onset of pain  Friday evening Comments Pt agrees all med doses and directions are correct. Nicki Guadalajara Fergerson CMA Duncan Dull)  March 14, 2010 8:42 AM    Primary Care Provider:  [name] FNP  CC:  Rm 4  Pt has left ear pain since Friday evening.  History of Present Illness: Has dizziness, left ear pain for 3 days. Tried OTC drops and tylenol with very minimal improvement.  Feels irritable due to the pain.  Denies associated sore throat, nasal congestion, HA or fever.  Allergies: 1)  ! Iodine 2)  ! * Shellfish 3)  ! Pcn 4)  ! Erythromycin 5)  ! Codeine 6)  ! * Contrast Dye 7)  ! Talwin 8)  ! Lidocaine 9)  ! Asa 10)  ! * Latex 11)  ! Sulfa 12)  ! * Bee Sting  Past History:  Past Medical History: Last updated: 12/01/2009 Asthma Arthritis Depression diverticulitis(colonoscopy)--06/2009 Migraines GERD Hx of Esophageal Ulcer HTN Allergies Hypercholesterolemia colon Polyps Thyroid Cancer--2000 Urinary Incontinence UTIs  Past Surgical History: Last updated: 12/01/2009 Cholecystectomy--1990 Left Breast Biopsy--2001 Appendectomy--2004 Tonsillectomy/addenoidectomy--2000 hysterectomy & bladder sling--2004 Thyroidectomy--2000 colonscopy / polypectomy--06/2009  Review of Systems       see HPI  Physical Exam  General:  Uncomfortable appearing female, awake, alert and in NAD Head:  Normocephalic and atraumatic without obvious abnormalities. No apparent alopecia or  balding. Ears:  L TM dull with yellow opaque fluid noted behind TM. R TM with some clear fluid.  No erythema noted in either TM Mouth:  Oral mucosa and oropharynx without lesions or exudates.  Teeth in good repair. Lungs:  Normal respiratory effort, chest expands symmetrically. Lungs are clear to auscultation, no crackles or wheezes. Heart:  Normal rate and regular rhythm. S1 and S2 normal without gallop, murmur, click, rub or other extra sounds.   Impression & Recommendations:  Problem # 1:  OTITIS MEDIA, LEFT (ICD-382.9) Assessment New Multiple drug allergies.  Will plan to treat with Levaquin Her updated medication list for this problem includes:    Levaquin 500 Mg Tabs (Levofloxacin) ..... One tablet by mouth daily x 7 days  Complete Medication List: 1)  Calcium 500 Mg Tabs (Calcium) .... Take 1 tablet by mouth two times a day 2)  Lyrica 75 Mg Caps (Pregabalin) .... Take 1 capsule by mouth two times a daily as needed for pain 3)  Advair Diskus 250-50 Mcg/dose Aepb (Fluticasone-salmeterol) .Marland Kitchen.. 1 inhalation twice a day 4)  Albuterol Inhaler  .... As needed. 5)  Levothyroxine Sodium 200 Mcg Solr (Levothyroxine sodium) .... Take 1 tablet by mouth once a day 6)  Simvastatin 40 Mg Tabs (Simvastatin) .... One tablet by mouth daily 7)  Epi Pen  .... As needed 8)  Loratadine 10 Mg Tabs (Loratadine) .... As needed 9)  Zolpidem Tartrate 10 Mg Tabs (Zolpidem tartrate) .Marland KitchenMarland KitchenMarland Kitchen  To be taken at bedtime as needed for sleep 10)  Fish Oil 1200 Mg Caps (Omega-3 fatty acids) .... Take 1 capsule by mouth once a day 11)  Daily Multi Tabs (Multiple vitamins-minerals) .... Take 1 tablet by mouth once a day 12)  Vitamin D 1000 Unit Tabs (Cholecalciferol) .... Take 1 tablet by mouth once a day 13)  Prilosec Otc 20 Mg Tbec (Omeprazole magnesium) .... Take 1 tablet by mouth two times a day 14)  Moexipril Hcl 15 Mg Tabs (Moexipril hcl) .... Take 1 tablet by mouth once a day 15)  Premarin 0.625 Mg Tabs (Estrogens  conjugated) .... Take 1 tablet by mouth once a day 16)  Tramadol Hcl 100 Mg Xr24h-tab (Tramadol hcl) .... Once daily as needed for pain 17)  Ventolin Hfa 108 (90 Base) Mcg/act Aers (Albuterol sulfate) .... 2 puffs every 6 hours as needed 18)  Naratriptan Hcl 2.5 Mg Tabs (Naratriptan hcl) .... One tablet at start of migraine, may repeat in 4 hours if symptoms persist or headache returns. 19)  Levaquin 500 Mg Tabs (Levofloxacin) .... One tablet by mouth daily x 7 days  Patient Instructions: 1)  Call if symptoms worsen or do not improve.   Prescriptions: LEVAQUIN 500 MG TABS (LEVOFLOXACIN) one tablet by mouth daily x 7 days  #7 x 0   Entered and Authorized by:   [name] FNP   Signed by:   [name] FNP on 03/14/2010   Method used:   Electronically to        [facility] # [phone]* (retail)       [address] [place], [place]  [address]       Ph: [phone]       Fax: [phone]   RxID:   [id number]    Orders Added: 1)  Est. Patient Level III [27253]    Current Allergies (reviewed today): ! IODINE ! * SHELLFISH ! PCN ! ERYTHROMYCIN ! CODEINE ! * CONTRAST DYE ! TALWIN ! LIDOCAINE ! ASA ! * LATEX ! SULFA ! * BEE STING

## 2010-06-21 NOTE — Medication Information (Signed)
Summary: Step Therapy form for Cymbalta/[facility]  Step Therapy form for Cymbalta/[facility]   Imported By: Sherian Rein 02/09/2010 13:33:24  _____________________________________________________________________  External Attachment:    Type:   Image     Comment:   External Document

## 2010-06-21 NOTE — Letter (Signed)
Summary: Primary Care Consult Scheduled Letter  [facility]  [address]   [place], [place] [address]   Phone: [phone]  Fax: [phone]      12/21/2009 MRN: [id number]  Michelle Kennedy [address] [place], [place]  [address]    Dear Ms. Ashton,      We have scheduled an appointment for you.  At the recommendation of MELISSA O'SULLIVAN, we have scheduled you a consult with DR Talmage Nap, ENDOCRINOLOGY,[facility]  on SEPTEMBER 29,2011 at 9:30AM.  Their address is__[address] ,[place] . The office phone number is _[phone].  If this appointment day and time is not convenient for you, please feel free to call the office of the doctor you are being referred to at the number listed above and reschedule the appointment.     It is important for you to keep your scheduled appointments. We are here to make sure you are given good patient care. If you have questions or you have made changes to your appointment, please notify us at  [phone], ask for  HELEN.    Thank you,  Darral Dash Patient Care Coordinator [place] at [facility]

## 2010-06-21 NOTE — Progress Notes (Signed)
Summary: formulary alternatives  Phone Note Other Incoming   Caller: [facility]  ph)[phone]  fax) [phone] Summary of Call: Received fax from [facility] stating that Cymbalta and Zomig are not covered without prior auth and pt will need to complete step therapy. Forms forwarded to Provider to review possible alternatives. Member ID [id number].  Left message on [facility] voicemail that Michelle Kennedy is out of the office and it will be Monday before this is addressed. Michelle Kennedy CMA Duncan Dull)  January 25, 2010 5:07 PM   Follow-up for Phone Call        Left message for patient to return my call. Follow-up by: [name] FNP,  January 31, 2010 10:33 AM  Additional Follow-up for Phone Call Additional follow up Details #1::        Pt returned your call. She states that she thought the Lyrica replaced the Cymbalta. Pt has been taking Lyrica. Should she be taking both? Please advise. Michelle Kennedy CMA Duncan Dull)  February 01, 2010 1:45 PM     Additional Follow-up for Phone Call Additional follow up Details #2::    Called patient back.  She is only taking Lyrica- not on cymbalta.  Feeling well.  Will continue with Lyrica only. Pt is aware. Follow-up by: [name] FNP,  February 01, 2010 4:28 PM  New/Updated Medications: NARATRIPTAN HCL 2.5 MG TABS (NARATRIPTAN HCL) one tablet at start of migraine, may repeat in 4 hours if symptoms persist or headache returns. Prescriptions: NARATRIPTAN HCL 2.5 MG TABS (NARATRIPTAN HCL) one tablet at start of migraine, may repeat in 4 hours if symptoms persist or headache returns.  #6 x 2   Entered and Authorized by:   [name] FNP   Signed by:   [name] FNP on 02/01/2010   Method used:   Electronically to        [facility] # [phone]* (retail)       [address] [place], [place]  [address]       Ph: [phone]       Fax: [phone]   RxID:   [id number]

## 2010-06-21 NOTE — Assessment & Plan Note (Signed)
  Prescriptions: ADVAIR DISKUS 250-50 MCG/DOSE AEPB (FLUTICASONE-SALMETEROL) 1 inhalation twice a day  #1 x 3   Entered and Authorized by:   [name] FNP   Signed by:   [name] FNP on 03/14/2010   Method used:   Electronically to        [facility] # [phone]* (retail)       [address] [place], [place]  [address]       Ph: [phone]       Fax: [phone]   RxID:   [id number]

## 2010-06-21 NOTE — Progress Notes (Signed)
Summary: Pt getting worse  Phone Note Call from Patient Call back at [phone]   Reason for Call: Talk to Nurse Summary of Call: pt feels like she is getting worse since she started the antibiotic, pls advise Initial call taken by: Lannette Donath,  March 16, 2010 4:41 PM  Follow-up for Phone Call        Pt states as soon as she tries to get up and walk she has chest tightness, sob, ear still very painful.  Has been on abx. x 3 days and feels worse.  Please advise. Nicki Guadalajara Fergerson CMA Duncan Dull)  March 16, 2010 4:51 PM   Additional Follow-up for Phone Call Additional follow up Details #1::        Please call patient and arrange a follow up visit tomorrow.  She should go to ED if symptoms severe between now and her appointment. Additional Follow-up by: [name] FNP,  March 16, 2010 4:57 PM    Additional Follow-up for Phone Call Additional follow up Details #2::    Pt notified per Melissa's instructions and voices understanding. Appt scheduled for 03/17/10 with Dr Artist Pais at 2:15pm. Mervin Kung CMA Duncan Dull)  March 16, 2010 5:00 PM

## 2010-06-21 NOTE — Progress Notes (Signed)
Summary: [facility] appt  Phone Note From Other Clinic Call back at [phone]   Caller: Provider Summary of Call: SW Lynden Ang at [facility], they have contacted patient & patient is coming in this afternoon for an appt Initial call taken by: Lannette Donath,  December 01, 2009 3:11 PM

## 2010-06-21 NOTE — Assessment & Plan Note (Signed)
Summary: cpx/mhf Rsch per pt/dt--Rm 5   Vital Signs:  Patient profile:   55 year old female Height:      67.75 inches Weight:      245 pounds BMI:     37.66 Temp:     97.7 degrees F oral Pulse rate:   78 / minute Pulse rhythm:   regular Resp:     16 per minute BP sitting:   128 / 82  (right arm) Cuff size:   large  Vitals Entered By: Mervin Kung CMA Duncan Dull) (December 20, 2009 11:13 AM) CC: Room 5  Physical Is Patient Diabetic? No Comments Pt states Calcium was stopped due to stomach problems.  Nicki Guadalajara Fergerson CMA (AAMA)  December 20, 2009 10:30 AM    CC:  Room 5  Physical.  History of Present Illness: Ms Cavan is a 55 year old female who presents today for a complete physical. She reports that she is contemplating the "Owl Diet" which consists of 700 calories a day and use of HCG drops.  She has lost 6 pounds since her last visit.   Has been swimming.  She is up to date on her tetanus. She has been watching what she eats.  S/p total hysterectomy.  Last Mammo 2/1.  Last colo 4/11- had two polyps removed.  She reports + esophageal ulcer- her GI physician in [place] recommended that she increase her PPI- she reports that she was unable to follow up with him due to her recent relocation.    Allergies: 1)  ! Iodine 2)  ! * Shellfish 3)  ! Pcn 4)  ! Erythromycin 5)  ! Codeine 6)  ! * Contrast Dye 7)  ! Talwin 8)  ! Lidocaine 9)  ! Asa 10)  ! * Latex 11)  ! Sulfa 12)  ! * Bee Sting  Past History:  Past Medical History: Last updated: 12/01/2009 Asthma Arthritis Depression diverticulitis(colonoscopy)--06/2009 Migraines GERD Hx of Esophageal Ulcer HTN Allergies Hypercholesterolemia colon Polyps Thyroid Cancer--2000 Urinary Incontinence UTIs  Past Surgical History: Last updated: 12/01/2009 Cholecystectomy--1990 Left Breast Biopsy--2001 Appendectomy--2004 Tonsillectomy/addenoidectomy--2000 hysterectomy & bladder sling--2004 Thyroidectomy--2000 colonscopy /  polypectomy--06/2009  Family History: Last updated: 12/01/2009 Alcoholism--parents, paternal grandfather Arthritis--mother, materna grandmother Ovarian Cancer--mother Breast Cancer--mother,deceased Lung Cancer--father,deceased Hypercholesterolemia--father HTN--mother Kidney Disease--father Diabetes--father, paternal grandmother Bipolar--mother  Social History: Last updated: 12/01/2009 Occupation: student Married Never Smoked Alcohol use-no Drug use-no Regular exercise-yes  Risk Factors: Exercise: yes (12/01/2009)  Risk Factors: Smoking Status: never (12/01/2009)  Family History: Reviewed history from 12/01/2009 and no changes required. Alcoholism--parents, paternal grandfather Arthritis--mother, materna grandmother Ovarian Cancer--mother Breast Cancer--mother,deceased Lung Cancer--father,deceased Hypercholesterolemia--father HTN--mother Kidney Disease--father Diabetes--father, paternal grandmother Bipolar--mother  Social History: Reviewed history from 12/01/2009 and no changes required. 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 Resp: Denies cough CV:  Denies Chest Pain- occasional SOB due to asthma- but pt reports that this is at baseline. GI:  Denies nausea or vomitting GU: Denies dysuria Lymphatic: Denies lymphadenopathy Musculoskeletal:  + R hip pain and + left knee pain Skin:  Denies Rashes - + mole on her left thigh and on her back Psychiatric: Notes tearful at times. Neuro: Denies numbness     Physical Exam  General:  Well-developed,well-nourished,in no acute distress; alert,appropriate and cooperative throughout examination Head:  Normocephalic and atraumatic without obvious abnormalities. No apparent alopecia or balding. Eyes:  PERRLA Ears:  External ear exam shows no significant lesions or  deformities.  Otoscopic examination reveals clear canals, tympanic membranes are intact bilaterally without bulging, retraction, inflammation or discharge. Hearing is grossly normal bilaterally. Mouth:  Oral mucosa and oropharynx without lesions or exudates.  Teeth in good repair. Neck:  No deformities, masses, or tenderness noted. Breasts:  No mass, nodules, thickening, tenderness, bulging, retraction, inflamation, nipple discharge or skin changes noted.   Lungs:  Normal respiratory effort, chest expands symmetrically. Lungs are clear to auscultation, no crackles or wheezes. Heart:  Normal rate and regular rhythm. S1 and S2 normal without gallop, murmur, click, rub or other extra sounds. Abdomen:  Bowel sounds positive,abdomen soft and non-tender without masses, organomegaly or hernias noted. Genitalia:  not performed s/p TAH/BSO Msk:  No deformity or scoliosis noted of thoracic or lumbar spine.   Pulses:  R and L carotid,radial,femoral,dorsalis pedis and posterior tibial pulses are full and equal bilaterally Extremities:  No clubbing, cyanosis, edema, or deformity noted  Neurologic:  alert & oriented X3, cranial nerves II-XII intact, strength normal in all extremities, and gait normal.   Skin:  + pink raised lesion on left anterior thigh,  irregularly hyperpigmented lesion on back   Impression & Recommendations:  Problem # 1:  Preventive Health Care (ICD-V70.0) Assessment Comment Only Immunizations reviewed and up to date.  Up to date on Mammogram.  Will refer to GI to establish given her history of abnormal colonoscopy and esophageal ulcer.  Patient was counselled on healthy eating, diet and exercise. EKG performed today- NSR without acute changes.  Orders: TLB-BMP (Basic Metabolic Panel-BMET) (80048-METABOL) TLB-Hepatic/Liver Function Pnl (80076-HEPATIC) TLB-CBC Platelet - w/Differential (85025-CBCD) TLB-TSH (Thyroid Stimulating Hormone) (84443-TSH) TLB-Lipid Panel (80061-LIPID) EKG w/  Interpretation (93000)  Problem # 2:  OVERWEIGHT (ICD-278.02) Assessment: Improved Patient has lost 6 pounds since her last visit.  I advised her against the use of the "owl diet"- as 700 calories is not a healthy amount.   Also advised her that there is no solid evidence that the HCG supplements improve weight loss. Recommended that she try to stick to 1200 calories and continue her exercise program. Will refer to the weight loss center. Orders: Misc. Referral (Misc. Ref)  Problem # 3:  HYPOTHYROIDISM (ICD-244.9) Assessment: Unchanged Patient will return for TSH-  Will also refer to endocrinology- +history of thyroid cancer.   Her updated medication list for this problem includes:    Levothyroxine Sodium 200 Mcg Solr (Levothyroxine sodium) .Marland Kitchen... Take 1 tablet by mouth once a day  Orders: Endocrinology Referral (Endocrine)  Problem # 4:  SKIN LESIONS, MULTIPLE (ICD-709.9) Assessment: Comment Only Pt wishes to have these removed, recommended that she schedule a follow up apt for excision.  Problem # 5:  GERD (ICD-530.81) Assessment: Comment Only see #1 Her updated medication list for this problem includes:    Prilosec Otc 20 Mg Tbec (Omeprazole magnesium) .Marland Kitchen... Take 1 tablet by mouth two times a day  Orders: Gastroenterology Referral (GI)  Complete Medication List: 1)  Calcium 500 Mg Tabs (Calcium) .... Take 1 tablet by mouth two times a day 2)  Lyrica 75 Mg Caps (Pregabalin) .... Take 1 capsule by mouth two times a day 3)  Advair Diskus 250-50 Mcg/dose Aepb (Fluticasone-salmeterol) .Marland Kitchen.. 1 inhalation twice a day 4)  Albuterol Inhaler  .... As needed. 5)  Levothyroxine Sodium 200 Mcg Solr (Levothyroxine sodium) .... Take 1 tablet by mouth once a day 6)  Simvastatin 20 Mg Tabs (Simvastatin) .... Take 1  tablet by mouth once a day 7)  Epi Pen  .... As needed 8)  Loratadine 10 Mg Tabs (Loratadine) .... As needed 9)  Zolpidem Tartrate 10 Mg Tabs (Zolpidem tartrate) .... As needed for  sleep 10)  Fish Oil 1200 Mg Caps (Omega-3 fatty acids) .... Take 1 capsule by mouth once a day 11)  Daily Multi Tabs (Multiple vitamins-minerals) .... Take 1 tablet by mouth once a day 12)  Vitamin D 1000 Unit Tabs (Cholecalciferol) .... Take 1 tablet by mouth once a day 13)  Prilosec Otc 20 Mg Tbec (Omeprazole magnesium) .... Take 1 tablet by mouth two times a day 14)  Moexipril Hcl 15 Mg Tabs (Moexipril hcl) .... Take 1 tablet by mouth once a day 15)  Cymbalta 60 Mg Cpep (Duloxetine hcl) .... Take 1 capsule by mouth once a day 16)  Premarin 0.625 Mg Tabs (Estrogens conjugated) .... Take 1 tablet by mouth once a day 17)  Tramadol Hcl 100 Mg Xr24h-tab (Tramadol hcl) .... As needed for pain 18)  Ventolin Hfa 108 (90 Base) Mcg/act Aers (Albuterol sulfate) .... As needed 19)  Zomig 5 Mg Tabs (Zolmitriptan) .... As needed for migraines  Patient Instructions: 1)  You will be contacted about your referral to the weight loss clinic, Gastroenterology and Endocrinology. 2)  Please follow up in 3 months. 3)  Please return tomorrow AM for fasting lab work.    Current Allergies (reviewed today): ! IODINE ! * SHELLFISH ! PCN ! ERYTHROMYCIN ! CODEINE ! * CONTRAST DYE ! TALWIN ! LIDOCAINE ! ASA ! * LATEX ! SULFA ! * BEE STING

## 2010-06-21 NOTE — Letter (Signed)
Summary: New Patient letter  [facility]  [address] [place], [place] [address]   Phone: [phone]  Fax: [phone]       12/20/2009 MRN: [id number]  Michelle Kennedy [address] [place], [place]  [address]  Dear [name],  Welcome to the Gastroenterology Division at [facility].    You are scheduled to see Dr.  Christella Hartigan on 02-01-10 at 2:30p.m. on the 3rd floor at [facility], [address]  We ask that you try to arrive at our office 15 minutes prior to your appointment time to allow for check-in.  We would like you to complete the enclosed self-administered evaluation form prior to your visit and bring it with you on the day of your appointment.  We will review it with you.  Also, please bring a complete list of all your medications or, if you prefer, bring the medication bottles and we will list them.  Please bring your insurance card so that we may make a copy of it.  If your insurance requires a referral to see a specialist, please bring your referral form from your primary care physician.  Co-payments are due at the time of your visit and may be paid by cash, check or credit card.     Your office visit will consist of a consult with your physician (includes a physical exam), any laboratory testing he/she may order, scheduling of any necessary diagnostic testing (e.g. x-ray, ultrasound, CT-scan), and scheduling of a procedure (e.g. Endoscopy, Colonoscopy) if required.  Please allow enough time on your schedule to allow for any/all of these possibilities.    If you cannot keep your appointment, please call [phone] to cancel or reschedule prior to your appointment date.  This allows [place] the opportunity to schedule an appointment for another patient in need of care.  If you do not cancel or reschedule by 5 p.m. the business day prior to your appointment date, you will be charged a $50.00 late cancellation/no-show fee.    Thank you for choosing [facility] for your medical needs.  We appreciate the opportunity to care for you.  Please visit [place] at our website  to learn more about our practice.                     Sincerely,                                                             The Gastroenterology Division

## 2010-06-21 NOTE — Medication Information (Signed)
Summary: Step Therapy Request for Zomig/[facility]  Step Therapy Request for Zomig/[facility]   Imported By: Sherian Rein 02/09/2010 13:31:54  _____________________________________________________________________  External Attachment:    Type:   Image     Comment:   External Document

## 2010-06-22 LAB — HM DIABETES EYE EXAM

## 2010-06-23 NOTE — Letter (Signed)
Summary: Handicapped Placard/[facility]  Handicapped Placard/[facility]   Imported By: Lanelle Bal 05/20/2010 12:56:21  _____________________________________________________________________  External Attachment:    Type:   Image     Comment:   External Document

## 2010-06-23 NOTE — Progress Notes (Signed)
Summary: refill--lyrica  Phone Note Refill Request Message from:  Patient on June 06, 2010 1:24 PM  Refills Requested: Medication #1:  LYRICA 75 MG CAPS Take 1 capsule by mouth two times a daily as needed for pain   Dosage confirmed as above?Dosage Confirmed   Supply Requested: 1 month Pt last seen 12/11.  Next Appointment Scheduled: none Initial call taken by: Mervin Kung CMA Duncan Dull),  June 06, 2010 1:24 PM  Follow-up for Phone Call        Printed, please fax to pharmacy. Follow-up by: [name] FNP,  June 06, 2010 1:54 PM  Additional Follow-up for Phone Call Additional follow up Details #1::        Rx faxed to number below. Nicki Guadalajara Fergerson CMA Duncan Dull)  June 06, 2010 2:41 PM     Prescriptions: LYRICA 75 MG CAPS (PREGABALIN) Take 1 capsule by mouth two times a daily as needed for pain  #60 x 0   Entered and Authorized by:   [name] FNP   Signed by:   [name] FNP on 06/06/2010   Method used:   Printed then faxed to ...       [facility] [phone]* (retail)       [address]       [place], [place]  [address]       Ph: [phone]       Fax: [phone]   RxID:   [id number]

## 2010-06-23 NOTE — Assessment & Plan Note (Signed)
Summary: renewal handicap sticker/mhf--rm 5   Vital Signs:  Patient profile:   55 year old female Height:      67.75 inches Weight:      254 pounds BMI:     39.05 Temp:     97.6 degrees F oral Pulse rate:   78 / minute Pulse rhythm:   regular Resp:     18 per minute BP sitting:   126 / 90  (right arm) Cuff size:   large  Vitals Entered By: Mervin Kung CMA Duncan Dull) (May 10, 2010 1:30 PM) CC: Pt states she is having right hip pain; intermittent since last year. Would like something to help with the pain. Is Patient Diabetic? No Pain Assessment Patient in pain? yes     Location: right hip Intensity: 6-7 Onset of pain  last year Comments Pt agrees all med doses and directions are correct. Nicki Guadalajara Fergerson CMA Duncan Dull)  May 10, 2010 1:36 PM    Primary Care Provider:  [name] FNP  CC:  Pt states she is having right hip pain; intermittent since last year. Would like something to help with the pain.Marland Kitchen  History of Present Illness: Ms Demarco is a 55 year old female who presents with c/o right hip pain.  Had anaphylaxis to the steroid injection at [facility] helped for 2-3 months.  Chronic right hip pain at this time.  Heating pad helps.  Pain with walking and sitting.    Allergies: 1)  ! Iodine 2)  ! * Shellfish 3)  ! Pcn 4)  ! Erythromycin 5)  ! Codeine 6)  ! * Contrast Dye 7)  ! Talwin 8)  ! Lidocaine 9)  ! Asa 10)  ! * Latex 11)  ! Sulfa 12)  ! * Bee Sting  Past History:  Past Medical History: Last updated: 12/01/2009 Asthma Arthritis Depression diverticulitis(colonoscopy)--06/2009 Migraines GERD Hx of Esophageal Ulcer HTN Allergies Hypercholesterolemia colon Polyps Thyroid Cancer--2000 Urinary Incontinence UTIs  Past Surgical History: Last updated: 12/01/2009 Cholecystectomy--1990 Left Breast Biopsy--2001 Appendectomy--2004 Tonsillectomy/addenoidectomy--2000 hysterectomy & bladder sling--2004 Thyroidectomy--2000 colonscopy /  polypectomy--06/2009  Review of Systems       see HPI  Physical Exam  General:  Well-developed,well-nourished,in no acute distress; alert,appropriate and cooperative throughout examination Head:  Normocephalic and atraumatic without obvious abnormalities. No apparent alopecia or balding. Neck:  No deformities, masses, or tenderness noted. Lungs:  Normal respiratory effort, chest expands symmetrically. Lungs are clear to auscultation, no crackles or wheezes. Heart:  Normal rate and regular rhythm. S1 and S2 normal without gallop, murmur, click, rub or other extra sounds. Msk:  + pain with flexion and abduction of right hip.     Impression & Recommendations:  Problem # 1:  BURSITIS, RIGHT HIP (ICD-726.5) Assessment New  Pt is already on lyrica.  She had what she describes as an "anaphylactic" reaction to cortisone injection at [facility].  She has a reported history of remote ulcer- on PPI.  Will plan referral to [facility] for further evaluation/recommendation and recommended short term use of mobic for 1-2 weeks for inflammation/pain.  Handicap placard form was filled today due to her hip pain and FM.  Pt was counseled on importance of weight loss for joint pain.   Orders: Form Completion [id number]) Sports Medicine (Sports Med)  Complete Medication List: 1)  Calcium 500 Mg Tabs (Calcium) .... Take 1 tablet by mouth two times a day 2)  Lyrica 75 Mg Caps (Pregabalin) .... Take 1 capsule by mouth two times a daily  as needed for pain 3)  Advair Diskus 250-50 Mcg/dose Aepb (Fluticasone-salmeterol) .Marland Kitchen.. 1 inhalation twice a day 4)  Albuterol Inhaler  .... As needed. 5)  Levothyroxine Sodium 200 Mcg Solr (Levothyroxine sodium) .... Take 1 tablet by mouth once a day 6)  Simvastatin 40 Mg Tabs (Simvastatin) .... One tablet by mouth daily 7)  Epi Pen  .... As needed 8)  Loratadine 10 Mg Tabs (Loratadine) .... As needed 9)  Zolpidem Tartrate 10 Mg Tabs (Zolpidem tartrate) .... To be taken at bedtime  as needed for sleep 10)  Fish Oil 1200 Mg Caps (Omega-3 fatty acids) .... Take 1 capsule by mouth once a day 11)  Daily Multi Tabs (Multiple vitamins-minerals) .... Take 1 tablet by mouth once a day 12)  Vitamin D 1000 Unit Tabs (Cholecalciferol) .... Take 1 tablet by mouth once a day 13)  Prilosec Otc 20 Mg Tbec (Omeprazole magnesium) .... Take 1 tablet by mouth two times a day 14)  Moexipril Hcl 15 Mg Tabs (Moexipril hcl) .... Take 1 tablet by mouth once a day 15)  Premarin 0.3 Mg Tabs (Estrogens conjugated) .... One tablet by mouth daily for 1 month then stop 16)  Tramadol Hcl 100 Mg Xr24h-tab (Tramadol hcl) .... Once daily as needed for pain 17)  Ventolin Hfa 108 (90 Base) Mcg/act Aers (Albuterol sulfate) .... 2 puffs every 6 hours as needed 18)  Naratriptan Hcl 2.5 Mg Tabs (Naratriptan hcl) .... One tablet at start of migraine, may repeat in 4 hours if symptoms persist or headache returns. 19)  Albuterol Sulfate (5 Mg/ml) 0.5% Nebu (Albuterol sulfate) .... One nebulizer every 6 hours as needed for wheezing 20)  Omeprazole 20 Mg Cpdr (Omeprazole) .... One tab by mouth twice daily 21)  Mobic 7.5 Mg Tabs (Meloxicam) .... One tablet by mouth daily as needed for pain  Patient Instructions: 1)  You will be contacted about your referral to Dr. Pearletha Forge. Prescriptions: MOBIC 7.5 MG TABS (MELOXICAM) one tablet by mouth daily as needed for pain  #14 x 0   Entered and Authorized by:   [name] FNP   Signed by:   [name] FNP on 05/10/2010   Method used:   [phone][facility] (retail[facility]       [address] [place], [place]  [address]       Ph: [phone]       Fax: [phone]   RxID:   [id number]    Orders Added: 1)  Form Completion [99080] 2)  Sports Medicine [Sports Med] 3)  Est. Patient Level III [24401]    Current Allergies (reviewed today): ! IODINE ! * SHELLFISH ! PCN ! ERYTHROMYCIN ! CODEINE ! * CONTRAST  DYE ! TALWIN ! LIDOCAINE ! ASA ! * LATEX ! SULFA ! * BEE STING

## 2010-06-23 NOTE — Op Note (Signed)
Summary: Office Procedure Consent  Office Procedure Consent   Imported By: Darius Bump 05/12/2010 16:00:00  _____________________________________________________________________  External Attachment:    Type:   Image     Comment:   External Document

## 2010-06-23 NOTE — Assessment & Plan Note (Signed)
Summary: RT HIP PAIN/NP/LP   Vital Signs:  Patient profile:   55 year old female Height:      67.75 inches (172.09 cm) Weight:      255.2 pounds (116.00 kg) BMI:     39.23 Temp:     97.9 degrees F (36.61 degrees C) oral Pulse rate:   69 / minute BP sitting:   153 / 91  (right arm)  Vitals Entered By: Baxter Hire) (May 12, 2010 3:03 PM) CC: right hip pain Pain Assessment Patient in pain? yes     Location: right hip Intensity: 6 Onset of pain  Constant pain for the past 9-9 1/2 months Nutritional Status BMI of > 30 = obese  Does patient need assistance? Functional Status Self care Ambulation Normal   Primary Care Provider:  [name] FNP  CC:  right hip pain.  History of Present Illness: 55 yo F here for right lateral hip pain.  She reports prior history of the same and this has been going on over past 9+ months Diagnosed previously with trochanteric bursitis Was given an injection but the person doing it used an iodine swab which led to anaphylaxis and transportation to the ED (she emphasized she has had several cortisone injections with marcaine before and had no problem with these). Was not given exercise program nor has done PT. 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 Occasionally has back pain but nothing persistent. Pain worse when lies down on her right side. No groin pain. No numbness or tingling.  Habits & Providers  Alcohol-Tobacco-Diet     Tobacco Status: never  Problems Prior to Update: 1)  Bursitis, Right Hip  (ICD-726.5) 2)  Fibromyalgia  (ICD-729.1) 3)  Edema  (ICD-782.3) 4)  Asthma  (ICD-493.90) 5)  Hypothyroidism  (ICD-244.9) 6)  Gerd  (ICD-530.81) 7)  Overweight  (ICD-278.02) 8)  Depression  (ICD-311)  Medications Prior to Update: 1)  Calcium 500 Mg Tabs (Calcium) .... Take 1 Tablet By Mouth Two Times A Day 2)  Lyrica 75 Mg Caps (Pregabalin) .... Take 1 Capsule By Mouth Two Times A Daily As Needed For Pain 3)  Advair Diskus 250-50  Mcg/dose Aepb (Fluticasone-Salmeterol) .Marland Kitchen.. 1 Inhalation Twice A Day 4)  Albuterol Inhaler .... As Needed. 5)  Levothyroxine Sodium 200 Mcg Solr (Levothyroxine Sodium) .... Take 1 Tablet By Mouth Once A Day 6)  Simvastatin 40 Mg Tabs (Simvastatin) .... One Tablet By Mouth Daily 7)  Epi Pen .... As Needed 8)  Loratadine 10 Mg Tabs (Loratadine) .... As Needed 9)  Zolpidem Tartrate 10 Mg Tabs (Zolpidem Tartrate) .... To Be Taken At Bedtime As Needed For Sleep 10)  Fish Oil 1200 Mg Caps (Omega-3 Fatty Acids) .... Take 1 Capsule By Mouth Once A Day 11)  Daily Multi  Tabs (Multiple Vitamins-Minerals) .... Take 1 Tablet By Mouth Once A Day 12)  Vitamin D 1000 Unit Tabs (Cholecalciferol) .... Take 1 Tablet By Mouth Once A Day 13)  Prilosec Otc 20 Mg Tbec (Omeprazole Magnesium) .... Take 1 Tablet By Mouth Two Times A Day 14)  Moexipril Hcl 15 Mg Tabs (Moexipril Hcl) .... Take 1 Tablet By Mouth Once A Day 15)  Premarin 0.3 Mg Tabs (Estrogens Conjugated) .... One Tablet By Mouth Daily For 1 Month Then Stop 16)  Tramadol Hcl 100 Mg Xr24h-Tab (Tramadol Hcl) .... Once Daily As Needed For Pain 17)  Ventolin Hfa 108 (90 Base) Mcg/act Aers (Albuterol Sulfate) .... 2 Puffs Every 6 Hours As Needed 18)  Naratriptan Hcl 2.5 Mg Tabs (Naratriptan Hcl) .... One Tablet At [facility], May Repeat in 4 Hours If Symptoms Persist or Headache Returns. 19)  Albuterol Sulfate (5 Mg/ml) 0.5% Nebu (Albuterol Sulfate) .... One Nebulizer Every 6 Hours As Needed For Wheezing 20)  Omeprazole 20 Mg Cpdr (Omeprazole) .... One Tab By Mouth Twice Daily 21)  Mobic 7.5 Mg Tabs (Meloxicam) .... One Tablet By Mouth Daily As Needed For Pain  Allergies: 1)  ! Iodine 2)  ! * Shellfish 3)  ! Pcn 4)  ! Erythromycin 5)  ! Codeine 6)  ! * Contrast Dye 7)  ! Talwin 8)  ! Lidocaine 9)  ! Asa 10)  ! * Latex 11)  ! Sulfa 12)  ! * Bee Sting  Family History: Reviewed history from 12/01/2009 and no changes  required. Alcoholism--parents, paternal grandfather Arthritis--mother, materna grandmother Ovarian Cancer--mother Breast Cancer--mother,deceased Lung Cancer--father,deceased Hypercholesterolemia--father HTN--mother Kidney Disease--father Diabetes--father, paternal grandmother Bipolar--mother  Social History: Reviewed history from 12/01/2009 and no changes required. 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 TTP over greater trochanter reproducing her pain. 4/5 strength with hip abduction also reproducing her pain. FROM hip with negative log roll. Strength 5/5 with hip flexion and 4+/5 with hip ext rotation. sensation intact to light touch distally Negative SLRs.   Impression & Recommendations:  Problem # 1:  BURSITIS, RIGHT HIP (ICD-726.5) Assessment Deteriorated  Symptoms consistent with right greater trochanteric bursitis.  Also has hip abduction weakness common with this condition.  Shown home exercises (hip abduction) to do every day as well as ITB stretches - to pick 2-3 of these and do 3 x 20-30 seconds once a day.  Icing as needed.  Given injection again today without iodine - did well with this.  F/u as needed.  After informed written consent patient was lying on her left side on the exam table and area overlying right greater trochanter was prepped with alcohol swab.  Area of greater trochanteric bursa was then injected with 6:2 marcaine:depomedrol.  Patient tolerated the procedure well without any immediate complications.  Orders: Joint Aspirate / Injection, Large (20610)  Complete Medication List: 1)  Calcium 500 Mg Tabs (Calcium) .... Take 1 tablet by mouth two times a day 2)  Lyrica 75 Mg Caps (Pregabalin) .... Take 1  capsule by mouth two times a daily as needed for pain 3)  Advair Diskus 250-50 Mcg/dose Aepb (Fluticasone-salmeterol) .Marland Kitchen.. 1 inhalation twice a day 4)  Albuterol Inhaler  .... As needed. 5)  Levothyroxine Sodium 200 Mcg Solr (Levothyroxine sodium) .... Take 1 tablet by mouth once a day 6)  Simvastatin 40 Mg Tabs (Simvastatin) .... One tablet by mouth daily 7)  Epi Pen  .... As needed 8)  Loratadine 10 Mg Tabs (Loratadine) .... As needed 9)  Zolpidem Tartrate 10 Mg Tabs (Zolpidem tartrate) .... To be taken at bedtime as needed for sleep 10)  Fish Oil 1200 Mg Caps (Omega-3 fatty acids) .... Take 1 capsule by mouth once a day 11)  Daily Multi Tabs (Multiple vitamins-minerals) .... Take 1 tablet by mouth once a day 12)  Vitamin D 1000 Unit Tabs (Cholecalciferol) .... Take 1 tablet by mouth once a day 13)  Prilosec Otc 20 Mg Tbec (Omeprazole magnesium) .... Take 1 tablet by mouth two times a day 14)  Moexipril  Hcl 15 Mg Tabs (Moexipril hcl) .... Take 1 tablet by mouth once a day 15)  Premarin 0.3 Mg Tabs (Estrogens conjugated) .... One tablet by mouth daily for 1 month then stop 16)  Tramadol Hcl 100 Mg Xr24h-tab (Tramadol hcl) .... Once daily as needed for pain 17)  Ventolin Hfa 108 (90 Base) Mcg/act Aers (Albuterol sulfate) .... 2 puffs every 6 hours as needed 18)  Naratriptan Hcl 2.5 Mg Tabs (Naratriptan hcl) .... One tablet at start of migraine, may repeat in 4 hours if symptoms persist or headache returns. 19)  Albuterol Sulfate (5 Mg/ml) 0.5% Nebu (Albuterol sulfate) .... One nebulizer every 6 hours as needed for wheezing 20)  Omeprazole 20 Mg Cpdr (Omeprazole) .... One tab by mouth twice daily 21)  Mobic 7.5 Mg Tabs (Meloxicam) .... One tablet by mouth daily as needed for pain   Orders Added: 1)  New Patient Level III [56387] 2)  Joint Aspirate / Injection, Large [20610]

## 2010-07-14 ENCOUNTER — Telehealth: Payer: Self-pay | Admitting: Family

## 2010-07-19 NOTE — Progress Notes (Signed)
Summary: refill--lyrica  Phone Note Refill Request Message from:  [facility]ax from [facility] on July 14, 2010 4:38 PM  Refills Requested: Medication #1:  LYRICA 75 MG CAPS Take 1 capsule by mouth two times a daily as needed for pain   Dosage confirmed as above?Dosage Confirmed   Supply Requested: 1 month   Last Refilled: 07/08/2010 Initial call taken by: Mervin Kung CMA Duncan Dull),  July 14, 2010 4:39 PM  Follow-up for Phone Call        Refill left on pharmacy voicemail. Nicki Guadalajara Fergerson CMA Duncan Dull)  July 14, 2010 4:40 PM     Prescriptions: LYRICA 75 MG CAPS (PREGABALIN) Take 1 capsule by mouth two times a daily as needed for pain  #60 x 0   Entered by:   Mervin Kung CMA (AAMA)   Authorized by:   [name] FNP   Signed by:   Mervin Kung CMA (AAMA) on 07/14/2010   Method used:   Telephoned to ...       [facility] [address] (retail)       [address]       [place], [place]  [address]       Ph: [phone] or [phone]       Fax: [phone]   RxID:   [id number]

## 2010-08-04 ENCOUNTER — Telehealth: Payer: Self-pay | Admitting: Family

## 2010-08-07 LAB — BASIC METABOLIC PANEL
BUN: 13 mg/dL (ref 6–23)
CO2: 27 mEq/L (ref 19–32)
Chloride: 105 mEq/L (ref 96–112)
Creatinine, Ser: 0.7 mg/dL (ref 0.4–1.2)
Glucose, Bld: 89 mg/dL (ref 70–99)
Potassium: 4.4 mEq/L (ref 3.5–5.1)

## 2010-08-07 LAB — CBC
HCT: 44.1 % (ref 36.0–46.0)
MCH: 25.5 pg — ABNORMAL LOW (ref 26.0–34.0)
MCHC: 31.7 g/dL (ref 30.0–36.0)
MCV: 80.6 fL (ref 78.0–100.0)
Platelets: 275 10*3/uL (ref 150–400)
RDW: 13.5 % (ref 11.5–15.5)
WBC: 5.9 10*3/uL (ref 4.0–10.5)

## 2010-08-07 LAB — URINALYSIS, ROUTINE W REFLEX MICROSCOPIC
Bilirubin Urine: NEGATIVE
Ketones, ur: NEGATIVE mg/dL
Nitrite: NEGATIVE
Protein, ur: NEGATIVE mg/dL
pH: 7 (ref 5.0–8.0)

## 2010-08-07 LAB — DIFFERENTIAL
Basophils Absolute: 0 10*3/uL (ref 0.0–0.1)
Eosinophils Absolute: 0.1 10*3/uL (ref 0.0–0.7)
Eosinophils Relative: 2 % (ref 0–5)
Monocytes Absolute: 0.4 10*3/uL (ref 0.1–1.0)

## 2010-08-09 NOTE — Progress Notes (Signed)
Summary: refills: moexipril, levothyroxine,premarin  Phone Note Refill Request Message from:  Fax from [facility]  on August 04, 2010 4:48 PM  Refills Requested: Medication #1:  LEVOTHYROXINE SODIUM 200 MCG SOLR Take 1 tablet by mouth once a day   Dosage confirmed as above?Dosage Confirmed   Supply Requested: 3 months   Last Refilled: 02/18/2010  Medication #2:  MOEXIPRIL HCL 15 MG TABS Take 1 tablet by mouth once a day   Dosage confirmed as above?Dosage Confirmed   Supply Requested: 3 months   Last Refilled: 01/18/2010 Pt also requesting refill on Premarin. Per addendum to d.o.s 03/22/10 pt was supposed to stop Premarin after 1 month. Should not be requesting refill.  Pt has no appts. on file, when does she need to follow up again?  Next Appointment Scheduled: none Initial call taken by: Mervin Kung CMA Duncan Dull),  August 04, 2010 4:49 PM  Follow-up for Phone Call        I would like for her to try coming off of the Premarin.  Also, did she end up seeing Dr. Talmage Nap (endo) for her thyroid last summer? If yes- Dr. Talmage Nap should refill levothyroxine.  If no, OK to send 1 month supply. F/u in 1 month.  Follow-up by: [name] FNP,  August 05, 2010 9:06 AM  Additional Follow-up for Phone Call Additional follow up Details #1::        Spoke with pt. She did not see Dr Talmage Nap and requests that I mail the contact info to her. Advised pt of need to follow up with Esker Dever in 1 month and pt states she will have to call us back to schedule. 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  Pt also states she is not coming off her Premarin at this time with everything else that is going on. Please advise re: refill. [facility] will not dispense quantities less than 3 month supply. Additional Follow-up by: Mervin Kung CMA Duncan Dull),  August 05, 2010 9:28 AM    Additional Follow-up for  Phone Call Additional follow up Details #2::    ok to send 90 day supply to her pharmacy for each,(no refills)  however- I do need for her to be seen in the office within the next month to recheck her thyroid and to further discuss her estrogen therapy.   Follow-up by: [name] FNP,  August 05, 2010 9:37 AM  Additional Follow-up for Phone Call Additional follow up Details #3:: Details for Additional Follow-up Action Taken: Refills sent. Pt notified and endocrine info mailed to pt. Nicki Guadalajara Fergerson CMA Duncan Dull)  August 05, 2010 9:58 AM   Prescriptions: PREMARIN 0.3 MG TABS (ESTROGENS CONJUGATED) one tablet by mouth daily for 1 month then stop  #90 x 0   Entered by:   Mervin Kung CMA (AAMA)   Authorized by:   [name] FNP   Signed by:   Mervin Kung CMA (AAMA) on 08/05/2010   Method used:   Electronically to        [facility]* (mail-order)       [address]       [place], [place]  [address]       Ph: [phone]       Fax: [phone]   RxID:   [id number] MOEXIPRIL HCL 15 MG TABS (MOEXIPRIL HCL) Take 1 tablet by mouth once  a day  #90 x 0   Entered by:   Mervin Kung CMA (AAMA)   Authorized by:   [name] FNP   Signed by:   Mervin Kung CMA (AAMA) on 08/05/2010   Method used:   Electronically to        [facility]* (mail-order)       [address]       [place], [place]  [address]       Ph: [phone]       Fax: [phone]   RxID:   [id number] LEVOTHYROXINE SODIUM 200 MCG SOLR (LEVOTHYROXINE SODIUM) Take 1 tablet by mouth once a day  #90 x 0   Entered by:   Mervin Kung CMA (AAMA)   Authorized by:   [name] FNP   Signed by:   Mervin Kung CMA (AAMA) on 08/05/2010   Method used:   Electronically to        [facility]* (mail-order)       [address]       [place], [place]  [address]       Ph: [phone]        Fax: [phone]   RxID:   [id number]   Appended Document: refills: moexipril, levothyroxine,premarin    Clinical Lists Changes  Medications: Rx of LEVOTHYROXINE SODIUM 200 MCG SOLR (LEVOTHYROXINE SODIUM) Take 1 tablet by mouth once a day;  #90 x 0;  Signed;  Entered by: Mervin Kung CMA (AAMA);  Authorized by: [name] FNP;  Method used: Faxed to [facility]*, [address] [place], [place]  [address], Ph: [phone], Fax: [phone] Rx of PREMARIN 0.3 MG TABS (ESTROGENS CONJUGATED) one tablet by mouth daily for 1 month then stop;  #90 x 0;  Signed;  Entered by: Mervin Kung CMA (AAMA);  Authorized by: [name] FNP;  Method used: Faxed to [facility]*, [address], [place], [place]  [address], Ph: [phone], Fax: [phone] Rx of MOEXIPRIL HCL 15 MG TABS (MOEXIPRIL HCL) Take 1 tablet by mouth once a day;  #90 x 0;  Signed;  Entered by: Mervin Kung CMA (AAMA);  Authorized by: [name] FNP;  Method used: Faxed to [facility]*, [address], [place], [place]  [address], Ph: [phone], Fax: [phone]    Prescriptions: MOEXIPRIL HCL 15 MG TABS (MOEXIPRIL HCL) Take 1 tablet by mouth once a day  #90 x 0   Entered by:   Mervin Kung CMA (AAMA)   Authorized by:   [name] FNP   Signed by:   Mervin Kung CMA (AAMA) on 08/05/2010   Method used:   F[facility]ed to ...       [facility] ([facility])       [address]       [place], [place]  [address]       Ph: [phone]       Fax: [phone]   RxID:   [id number] PREMARIN 0.3 MG TABS (ESTROGENS CONJUGATED) one tablet by mouth daily for 1 month then stop  #90 x 0   Entered by:   Mervin Kung CMA (AAMA)   Authorized by:   [name] FNP   Signed by:   Mervin Kung CMA (AAMA) on 08/05/2010   Method used:   Faxed to ...        [facility] Environmental education officer)  [address]       [place], [place]  [address]       Ph: [phone]       Fax: [phone]   RxID:   [id number] LEVOTHYROXINE SODIUM 200 MCG SOLR (LEVOTHYROXINE SODIUM) Take 1 tablet by mouth once a day  #90 x 0   Entered by:   Mervin Kung CMA (AAMA)   Authorized by:   [name] FNP   Signed by:   Mervin Kung CMA (AAMA) on 08/05/2010   Method used:   Faxed to ...       [facility] ([facility])       [address]       [place], [place]  [address]       Ph: [phone]       Fax: [phone]   RxID:   [id number]  Received rx failure notice from original transmission of above rxs. Faxed them to [facility] via EMR. Nicki Guadalajara Fergerson CMA (AAMA)  August 05, 2010 10:10 AM

## 2010-09-05 ENCOUNTER — Telehealth: Payer: Self-pay | Admitting: Family

## 2010-09-05 MED ORDER — PREGABALIN 75 MG PO CAPS: 75.0000 mg | ORAL_CAPSULE | Freq: Two times a day (BID) | ORAL | Status: DC

## 2010-09-05 NOTE — Telephone Encounter (Signed)
[facility]

## 2010-09-05 NOTE — Telephone Encounter (Signed)
Refill- lyrica 75mg  capsule. Take one capsule twice a day. Qty 60. Last fill 2.23.12

## 2010-09-21 ENCOUNTER — Ambulatory Visit (HOSPITAL_BASED_OUTPATIENT_CLINIC_OR_DEPARTMENT_OTHER)
Admission: RE | Admit: 2010-09-21 | Discharge: 2010-09-21 | Disposition: A | Discharge status: Home / Self Care | Payer: BC Managed Care – PPO | Source: Ambulatory Visit | Attending: Family | Admitting: Family

## 2010-09-21 ENCOUNTER — Encounter: Payer: Self-pay | Admitting: Family

## 2010-09-21 ENCOUNTER — Ambulatory Visit (INDEPENDENT_AMBULATORY_CARE_PROVIDER_SITE_OTHER): Payer: BC Managed Care – PPO | Admitting: Family

## 2010-09-21 ENCOUNTER — Telehealth: Payer: Self-pay | Admitting: Family

## 2010-09-21 ENCOUNTER — Other Ambulatory Visit: Payer: Self-pay | Admitting: Family

## 2010-09-21 DIAGNOSIS — E039 Hypothyroidism, unspecified: Secondary | ICD-10-CM

## 2010-09-21 DIAGNOSIS — Z1231 Encounter for screening mammogram for malignant neoplasm of breast: Secondary | ICD-10-CM

## 2010-09-21 DIAGNOSIS — M76899 Other specified enthesopathies of unspecified lower limb, excluding foot: Secondary | ICD-10-CM

## 2010-09-21 DIAGNOSIS — E0590 Thyrotoxicosis, unspecified without thyrotoxic crisis or storm: Secondary | ICD-10-CM

## 2010-09-21 DIAGNOSIS — E785 Hyperlipidemia, unspecified: Secondary | ICD-10-CM

## 2010-09-21 LAB — HEPATIC FUNCTION PANEL
ALT: 22 U/L (ref 0–35)
Alkaline Phosphatase: 62 U/L (ref 39–117)
Bilirubin, Direct: 0.1 mg/dL (ref 0.0–0.3)
Indirect Bilirubin: 0.3 mg/dL (ref 0.0–0.9)
Total Protein: 7 g/dL (ref 6.0–8.3)

## 2010-09-21 LAB — TSH: TSH: 0.075 u[IU]/mL — ABNORMAL LOW (ref 0.350–4.500)

## 2010-09-21 MED ORDER — LEVOTHYROXINE SODIUM 200 MCG PO TABS: 200.0000 ug | ORAL_TABLET | Freq: Every day | ORAL | Status: DC

## 2010-09-21 MED ORDER — PREGABALIN 75 MG PO CAPS: 75.0000 mg | ORAL_CAPSULE | Freq: Two times a day (BID) | ORAL | Status: DC

## 2010-09-21 MED ORDER — SIMVASTATIN 40 MG PO TABS: 40.0000 mg | ORAL_TABLET | Freq: Every day | ORAL | Status: DC

## 2010-09-21 MED ORDER — ESTROGENS CONJUGATED 0.3 MG PO TABS: ORAL_TABLET | ORAL | Status: DC

## 2010-09-21 NOTE — Assessment & Plan Note (Signed)
54 yr old female presents with chronic right hip pain.  She is interested in steroid injection- however she notes that she is allergic to betadine so this cannot be used.

## 2010-09-21 NOTE — Progress Notes (Signed)
  Subjective:    Patient ID: Michelle Kennedy, female    DOB: 03/14/1956, 55 y.o.   MRN: [id number]  HPI  [name] is a 55 yr old female who presents today for routine follow up.  1. Hypothyroidism- pt with hx of thyroid cancer- s/p thyroidectomy.  She reports that she never saw Dr. Talmage Kennedy due to finances.  She is agreeable to referral at this time. Denies fatigue.    2. 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 She saw Dr. Clearance Kennedy opthalmology and was placed on valtrex.  She is going to keep her on valtrex x 5 months.    3.  HRT- she reports that she is trying to wean off of the premarin- currently taking every other day. She reports + hot flashes and mood swings.  4.  Hip pain- This is ongoing- she is interested in seeing Dr. Pearletha Kennedy.    Review of Systems See HPI    Objective:   Physical Exam  Constitutional: She appears well-developed and well-nourished.  HENT:       No neck masses or palpable cervical LAD.  Eyes: Conjunctivae are normal. Left eye exhibits no discharge.  Cardiovascular: Normal rate and regular rhythm.   Pulmonary/Chest: Effort normal and breath sounds normal.  Psychiatric: She has a normal mood and affect. Her behavior is normal. Judgment and thought content normal.          Assessment & Plan:

## 2010-09-21 NOTE — Assessment & Plan Note (Signed)
Check TSH.  Will refer to endocrinology- pt has history of thyroid cancer.

## 2010-09-21 NOTE — Patient Instructions (Signed)
Please complete your blood work on the first floor. You will be contacted about your referral to Dr. Pearletha Forge and Dr. Endocrinology.

## 2010-09-22 ENCOUNTER — Encounter: Payer: Self-pay | Admitting: Family Medicine

## 2010-09-22 ENCOUNTER — Ambulatory Visit (INDEPENDENT_AMBULATORY_CARE_PROVIDER_SITE_OTHER): Payer: BC Managed Care – PPO | Admitting: Family Medicine

## 2010-09-22 ENCOUNTER — Ambulatory Visit (HOSPITAL_BASED_OUTPATIENT_CLINIC_OR_DEPARTMENT_OTHER)
Admission: RE | Admit: 2010-09-22 | Discharge: 2010-09-22 | Disposition: A | Discharge status: Home / Self Care | Payer: BC Managed Care – PPO | Source: Ambulatory Visit | Attending: Family Medicine | Admitting: Family Medicine

## 2010-09-22 VITALS — BP 114/77 | HR 83 | Ht 68.0 in | Wt 244.0 lb

## 2010-09-22 DIAGNOSIS — M25569 Pain in unspecified knee: Secondary | ICD-10-CM | POA: Insufficient documentation

## 2010-09-22 DIAGNOSIS — M25562 Pain in left knee: Secondary | ICD-10-CM

## 2010-09-22 DIAGNOSIS — M76899 Other specified enthesopathies of unspecified lower limb, excluding foot: Secondary | ICD-10-CM

## 2010-09-22 NOTE — Patient Instructions (Addendum)
Ice over right hip 3-4 times a day 15 minutes at a time Start physical therapy for this - go up to 6 weeks but at least for 2-3 visits. You were given a steroid injection into both the bursa and into your left knee. Avoid painful activities when possible Avoid lying on affected side when possible. Continue ibuprofen as needed. Your left knee pain and swelling are secondary to arthritis. Cortisone injections are an option and can be repeated every 3 months. If cortisone injections do not help, there are different types of shots that may help but they take longer to take effect. It's important that you continue to stay active. If you are overweight, try to lose weight through diet and exercise. Can ask physical therapy about general left lower leg strengthening program for arthritis in your knee. Heat or ice 15 minutes at a time 3-4 times a day as needed to help with pain in knee. Water aerobics and cycling with low resistance are the best two types of exercise for arthritis. Follow up with me in 6 weeks for a recheck.

## 2010-09-23 ENCOUNTER — Encounter: Payer: Self-pay | Admitting: Family Medicine

## 2010-09-23 ENCOUNTER — Telehealth: Payer: Self-pay | Admitting: Family

## 2010-09-23 DIAGNOSIS — E039 Hypothyroidism, unspecified: Secondary | ICD-10-CM

## 2010-09-23 DIAGNOSIS — M25562 Pain in left knee: Secondary | ICD-10-CM | POA: Insufficient documentation

## 2010-09-23 MED ORDER — LEVOTHYROXINE SODIUM 175 MCG PO TABS: 175.0000 ug | ORAL_TABLET | Freq: Every day | ORAL | Status: DC

## 2010-09-23 NOTE — Telephone Encounter (Signed)
Rx to [facility] was cancelled by Kara Mead. New dosage was sent to local pharmacy as pt will need TSH recheck in 6 weeks to determine if she will continue same dose. Left message on machine to have pt return my call.

## 2010-09-23 NOTE — Telephone Encounter (Signed)
Pls call patient and let her know that I would like to decrease her thyroid medication based on her thyroid results.  I have sent rx to her pharmacy of .  Please cancel the rx for sent yesterday.

## 2010-09-23 NOTE — Assessment & Plan Note (Signed)
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 Proceeded with aspiration and injection for symptomatic relief - can repeat at 3 month intervals if needed. Icing, tylenol for pain. Also takes lyrica.   After informed written consent patient was lying supine on exam table. Left knee was prepped with alcohol swab. 3 mL marcaine used for local anesthesia. Then 18gauge needle on 60cc syringe used to aspirate 42mL straw colored clear fluid. Then 3:1 marcaine:depomedrol was injected into left knee. Patient tolerated the procedure well without any immediate complications.

## 2010-09-23 NOTE — Assessment & Plan Note (Signed)
exam again consistent with trochanteric bursitis. Repeat injection today but add PT as she has not done this in the past. Continue home exercises in the meantime.   After informed written consent patient was lying on her left side on the exam table and area overlying right greater trochanter was prepped with alcohol swab. Area of greater trochanteric bursa was then injected with 6:2 marcaine:depomedrol. Patient tolerated the procedure well without any immediate complications.

## 2010-09-23 NOTE — Progress Notes (Signed)
Subjective:    Patient ID: Michelle Kennedy, female    DOB: 1956-01-15, 55 y.o.   MRN: [id number]  HPI  55 yo F here for right lateral hip pain and left knee pain.  1. Right hip pain She reports prior history of the same and this has been going on over past 15+ months Diagnosed previously with trochanteric bursitis Diagnosed with the same on last OV here and given injection. Injection helped significantly for more than 6 weeks. Also was doing exercises shown which helped some but pain has come back. Has not done formal PT. Pain on outside of right hip.  None in groin or low back. 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 Pain worse when lies down on her right side. No numbness or tingling.  2. Left knee pain About 1 year ago was walking on stairs, fell and hit kneecap directly causing patellar tendon rupture. Was surgically repaired by Dr. Kirke Corin in [place]. States had popping and intermittent pain in knee prior to this though Most of pain is medial in left knee. Associated with swelling. Has had cortisone injections in past. Denies locking, giving out Did have an MRI when she tore patellar tendon - also had arthroscopy with her repair surgery with no meniscus damage then.  Past Medical History  Diagnosis Date  . Arthritis   . Asthma   . GERD (gastroesophageal reflux disease)   . Depression   . Allergy   . Hypertension   . Hyperlipidemia   . Diverticulitis 06/2009  . Migraine   . History of esophageal ulcer   . Colon polyps   . Cancer 2000    thyroid  . Urinary incontinence   . Hx: UTI (urinary tract infection)     Current Outpatient Prescriptions on File Prior to Visit  Medication Sig Dispense Refill  . albuterol (PROVENTIL) (5 MG/ML) 0.5% nebulizer solution Take 2.5 mg by nebulization every 6 (six) hours as needed. Wheezing.       Marland Kitchen albuterol (VENTOLIN HFA) 108 (90 BASE) MCG/ACT inhaler Inhale 2 puffs into the lungs every 6 (six) hours as needed.       . Calcium Carbonate (CALCIUM 500 PO) Take  1 tablet by mouth 2 (two) times daily.        . cholecalciferol (VITAMIN D) 1000 UNITS tablet Take 1,000 Units by mouth daily.        Marland Kitchen EPINEPHrine (EPI-PEN) 0.3 mg/0.3 mL DEVI Inject 0.3 mg into the muscle once. For severe allergic reaction.       Marland Kitchen estrogens, conjugated, (PREMARIN) 0.3 MG tablet Take 1 tablet by mouth every other day for 1 month, then every 3rd day for 1 month then stop.  30 tablet  1  . Fluticasone-Salmeterol (ADVAIR DISKUS) 250-50 MCG/DOSE AEPB Inhale 1 puff into the lungs 2 (two) times daily.        Marland Kitchen loratadine (CLARITIN) 10 MG tablet Take 10 mg by mouth as needed.        . loteprednol (LOTEMAX) 0.5 % ophthalmic suspension Place 1 drop into both eyes 3 (three) times daily. For 1 week.       . meloxicam (MOBIC) 7.5 MG tablet Take 7.5 mg by mouth daily as needed. For pain.       . moexipril (UNIVASC) 15 MG tablet Take 15 mg by mouth daily.        . Multiple Vitamin (MULTIVITAMIN) tablet Take 1 tablet by mouth daily.        . naratriptan (AMERGE) 2.5 MG tablet  Take 2.5 mg by mouth as needed. Take one (1) tablet at onset of headache; if returns or does not resolve, may repeat after 4 hours; do not exceed five (5) mg in 24 hours.       . Omega-3 Fatty Acids (FISH OIL) 1200 MG CAPS Take 1 capsule by mouth daily.        Marland Kitchen omeprazole (PRILOSEC) 20 MG capsule Take 20 mg by mouth 2 (two) times daily.        . pregabalin (LYRICA) 75 MG capsule Take 1 capsule (75 mg total) by mouth 2 (two) times daily.  60 capsule  0  . simvastatin (ZOCOR) 40 MG tablet Take 1 tablet (40 mg total) by mouth daily.  30 tablet  5  . traMADol (ULTRAM-ER) 100 MG 24 hr tablet Take 100 mg by mouth daily as needed. For pain       . valACYclovir (VALTREX) 1000 MG tablet Take 1 tablet twice a day for 10 days then 1/2 tablet (500mg ) once a day for 5 months.       . zolpidem (AMBIEN) 10 MG tablet Take 10 mg by mouth at bedtime as needed.        Marland Kitchen DISCONTD: levothyroxine (SYNTHROID, LEVOTHROID) 200 MCG tablet Take 1  tablet (200 mcg total) by mouth daily.  30 tablet  3    Past Surgical History  Procedure Date  . Cholecystectomy 1990  . Breast surgery 2011    left breast biposy  . Appendectomy 2004  . Tonsillectomy and adenoidectomy 2000  . Abdominal hysterectomy 2004    also had bladder sling  . Thyroidectomy 2000  . Colonoscopy w/ polypectomy 06/2009    Allergies  Allergen Reactions  . Aspirin     REACTION: stomach sensitivity  . Codeine     REACTION: rash, dyspnea  . Erythromycin     REACTION: rash, dyspnea  . Iodine     REACTION: heart stopped  . Latex     REACTION: Swelling  . Lidocaine     REACTION: throat swelling  . Penicillins     REACTION: rash  . Pentazocine Lactate     REACTION: hives, difficulty swallowing  . Sulfonamide Derivatives     REACTION: rash    History   Social History  . Marital Status: Married    Spouse Name: N/A    Number of Children: N/A  . 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 Smoking status: Never Smoker   . Smokeless tobacco: Not on file  . Alcohol Use: No  . Drug Use: No  . Sexually Active: Not on file   Other Topics Concern  . Not on file   Social History Narrative   Regular exercise: yes    Family History  Problem Relation Age of Onset  . Alcohol abuse Mother   . Arthritis Mother   . Cancer Mother     ovarian, breast  . Hypertension Mother   . Bipolar disorder Mother   . Alcohol abuse Father   . Cancer Father     lung  . Hyperlipidemia Father   . Kidney disease Father   . Diabetes Father   . Arthritis Maternal Grandmother   . Diabetes Paternal Grandmother     BP 114/77  Pulse 83  Ht 5\' 8"  (1.727 m)  Wt 244 lb (110.678 kg)  BMI 37.10 kg/m2  Review of Systems See HPI above.  Objective:   Physical Exam  Gen: NAD  R hip: No gross deformity, swelling or bruising. TTP over greater trochanter reproducing her pain. 5/5 strength with hip abduction also  reproducing her pain. FROM hip with negative log roll. Sensation intact to light touch distally Negative SLRs.  L knee: Mod effusion. Mild warmth.  No bruising, erythema. Mod TTP medial joint line.  No post patellar, pes, lateral joint line TTP. ROM 0 - 100. Stable to valgus and varus stress.  Negative ant/post drawers.  Negative lachmanns Negative mcmurrays and apleys. NVI distally.     Assessment & Plan:  1. Right hip pain - exam again consistent with trochanteric bursitis.  Repeat injection today but add PT as she has not done this in the past.  Continue home exercises in the meantime.  2. 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  Proceeded with aspiration and injection for symptomatic relief - can repeat at 3 month intervals if needed.  Icing, tylenol for pain.  Also takes lyrica.  After informed written consent patient was lying on her left side on the exam table and area overlying right greater trochanter was prepped with alcohol swab.  Area of greater trochanteric bursa was then injected with 6:2 marcaine:depomedrol.  Patient tolerated the procedure well without any immediate complications.  After informed written consent patient was lying supine on exam table.  Left knee was prepped with alcohol swab.  3 mL marcaine used for local anesthesia.  Then 18gauge needle on 60cc syringe used to aspirate 42mL straw colored clear fluid.  Then 3:1 marcaine:depomedrol was injected into left knee.  Patient tolerated the procedure well without any immediate complications.

## 2010-09-26 ENCOUNTER — Encounter: Payer: Self-pay | Admitting: Family

## 2010-09-26 ENCOUNTER — Ambulatory Visit (INDEPENDENT_AMBULATORY_CARE_PROVIDER_SITE_OTHER): Payer: BC Managed Care – PPO | Admitting: Family

## 2010-09-26 VITALS — BP 120/86 | HR 79 | Temp 97.9°F | Resp 18 | Ht 67.75 in | Wt 244.1 lb

## 2010-09-26 DIAGNOSIS — B9789 Other viral agents as the cause of diseases classified elsewhere: Secondary | ICD-10-CM

## 2010-09-26 DIAGNOSIS — B349 Viral infection, unspecified: Secondary | ICD-10-CM

## 2010-09-26 DIAGNOSIS — J029 Acute pharyngitis, unspecified: Secondary | ICD-10-CM

## 2010-09-26 MED ORDER — ONDANSETRON HCL 4 MG PO TABS: 4.0000 mg | ORAL_TABLET | Freq: Three times a day (TID) | ORAL | Status: DC | PRN

## 2010-09-26 MED ORDER — ALBUTEROL SULFATE HFA 108 (90 BASE) MCG/ACT IN AERS: 2.0000 | INHALATION_SPRAY | Freq: Four times a day (QID) | RESPIRATORY_TRACT | Status: DC | PRN

## 2010-09-26 MED ORDER — DIPHENOXYLATE-ATROPINE 2.5-0.025 MG PO TABS: 1.0000 | ORAL_TABLET | Freq: Four times a day (QID) | ORAL | Status: AC | PRN

## 2010-09-26 NOTE — Patient Instructions (Signed)
Call if your symptoms worsen or if they do not improve in the next 48-72 hours.

## 2010-09-26 NOTE — Assessment & Plan Note (Signed)
Symptoms most consistent this time with acute viral illness. Rapid strep is negative. Recommended that she push fluids, a prescription was provided for Lomotil and when necessary Zofran.

## 2010-09-26 NOTE — Progress Notes (Signed)
Subjective:    Patient ID: Michelle Kennedy, female    DOB: 1955/06/24, 55 y.o.   MRN: [id number]  HPI Ms. Lieber is a 55 year old white female who presents today with chief complaint of vomiting and diarrhea. Symptoms started last night at 6 PM while she was at work. The  Initially thought that it was an allergy- sneezing, coughing.  Took claritin.  Woke up vomitting, + diarrhea.  Needs inhaler/neb medication as it is in storage.  She took tylenol earlier this AM.  + sore throat.    Review of Systems See history of present illness   Past Medical History  Diagnosis Date  . Arthritis   . Asthma   . GERD (gastroesophageal reflux disease)   . Depression   . Allergy   . Hypertension   . Hyperlipidemia   . Diverticulitis 06/2009  . Migraine   . History of esophageal ulcer   . Colon polyps   . Cancer 2000    thyroid  . Urinary incontinence   . Hx: UTI (urinary tract infection)     History   Social History  . Marital Status: Married    Spouse Name: N/A    Number of Children: N/A  . 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 Smoking status: Never Smoker   . Smokeless tobacco: Not on file  . Alcohol Use: No  . Drug Use: No  . Sexually Active: Not on file   Other Topics Concern  . Not on file   Social History Narrative   Regular exercise: yes    Past Surgical History  Procedure Date  . Cholecystectomy 1990  . Breast surgery 2011    left breast biposy  . Appendectomy 2004  . Tonsillectomy and adenoidectomy 2000  . Abdominal hysterectomy 2004    also had bladder sling  . Thyroidectomy 2000  . Colonoscopy w/ polypectomy 06/2009    Family History  Problem Relation Age of Onset  . Alcohol abuse Mother   . Arthritis Mother   . Cancer Mother     ovarian, breast  . Hypertension Mother   . Bipolar disorder Mother   . Alcohol abuse Father   . Cancer Father     lung  . Hyperlipidemia Father   . Kidney disease Father    . Diabetes Father   . Arthritis Maternal Grandmother   . Diabetes Paternal Grandmother     Allergies  Allergen Reactions  . Aspirin     REACTION: stomach sensitivity  . Codeine     REACTION: rash, dyspnea  . Erythromycin     REACTION: rash, dyspnea  . Iodine     REACTION: heart stopped  . Latex     REACTION: Swelling  . Lidocaine     REACTION: throat swelling  . Penicillins     REACTION: rash  . Pentazocine Lactate     REACTION: hives, difficulty swallowing  . Sulfonamide Derivatives     REACTION: rash    Current Outpatient Prescriptions on File Prior to Visit  Medication Sig Dispense Refill  . albuterol (PROVENTIL) (5 MG/ML) 0.5% nebulizer solution Take 2.5 mg by nebulization every 6 (six) hours as needed. Wheezing.       . Calcium Carbonate (CALCIUM 500 PO) Take 1 tablet by mouth 2 (two) times daily.        . cholecalciferol (VITAMIN D) 1000 UNITS tablet Take 1,000 Units by mouth daily.        Marland Kitchen  EPINEPHrine (EPI-PEN) 0.3 mg/0.3 mL DEVI Inject 0.3 mg into the muscle once. For severe allergic reaction.       Marland Kitchen estrogens, conjugated, (PREMARIN) 0.3 MG tablet Take 1 tablet by mouth every other day for 1 month, then every 3rd day for 1 month then stop.  30 tablet  1  . Fluticasone-Salmeterol (ADVAIR DISKUS) 250-50 MCG/DOSE AEPB Inhale 1 puff into the lungs 2 (two) times daily.        Marland Kitchen levothyroxine (SYNTHROID, LEVOTHROID) 175 MCG tablet Take 1 tablet (175 mcg total) by mouth daily.  30 tablet  1  . loratadine (CLARITIN) 10 MG tablet Take 10 mg by mouth as needed.        . loteprednol (LOTEMAX) 0.5 % ophthalmic suspension Place 1 drop into both eyes 3 (three) times daily. For 1 week.       . meloxicam (MOBIC) 7.5 MG tablet Take 7.5 mg by mouth daily as needed. For pain.       . moexipril (UNIVASC) 15 MG tablet Take 15 mg by mouth daily.        . Multiple Vitamin (MULTIVITAMIN) tablet Take 1 tablet by mouth daily.        . naratriptan (AMERGE) 2.5 MG tablet Take 2.5 mg by mouth  as needed. Take one (1) tablet at onset of headache; if returns or does not resolve, may repeat after 4 hours; do not exceed five (5) mg in 24 hours.       . Omega-3 Fatty Acids (FISH OIL) 1200 MG CAPS Take 1 capsule by mouth daily.        Marland Kitchen omeprazole (PRILOSEC) 20 MG capsule Take 20 mg by mouth 2 (two) times daily.        . pregabalin (LYRICA) 75 MG capsule Take 1 capsule (75 mg total) by mouth 2 (two) times daily.  60 capsule  0  . simvastatin (ZOCOR) 40 MG tablet Take 1 tablet (40 mg total) by mouth daily.  30 tablet  5  . traMADol (ULTRAM-ER) 100 MG 24 hr tablet Take 100 mg by mouth daily as needed. For pain       . valACYclovir (VALTREX) 1000 MG tablet Take 1 tablet twice a day for 10 days then 1/2 tablet (500mg ) once a day for 5 months.       . zolpidem (AMBIEN) 10 MG tablet Take 10 mg by mouth at bedtime as needed.        Marland Kitchen DISCONTD: albuterol (VENTOLIN HFA) 108 (90 BASE) MCG/ACT inhaler Inhale 2 puffs into the lungs every 6 (six) hours as needed.         BP 120/86  Pulse 79  Temp(Src) 97.9 F (36.6 C) (Oral)  Resp 18  Ht 5' 7.75" (1.721 m)  Wt 244 lb 1.3 oz (110.714 kg)  BMI 37.39 kg/m2  SpO2 97%       Objective:   Physical ExamGen.: Tired-appearing white female, awake and alert and in no acute distress. Wearing mask seated on the exam table Head: Normocephalic atraumatic Ears: Bilateral serous effusion is noted without bulging or erythema. Neck: Supple no lymphadenopathy is noted Mouth: No significant erythema is noted oropharynx, patient has an unusually small uvula Cardiovascular: S1-S2 regular rate and rhythm Respiratory: Breath sounds are clear to auscultation bilaterally without wheezes rales rhonchi           Assessment & Plan:

## 2010-09-28 NOTE — Telephone Encounter (Signed)
Addended by: Mervin Kung on: 09/28/2010 03:26 PM   Modules accepted: Orders

## 2010-09-28 NOTE — Telephone Encounter (Signed)
Pt notified and voices understanding. Order entered for repeat TSH in 6 weeks and forwarded to the lab.

## 2010-09-29 ENCOUNTER — Ambulatory Visit: Payer: BC Managed Care – PPO | Attending: Family Medicine | Admitting: Physical Therapy

## 2010-09-29 DIAGNOSIS — M25559 Pain in unspecified hip: Secondary | ICD-10-CM | POA: Insufficient documentation

## 2010-09-29 DIAGNOSIS — IMO0001 Reserved for inherently not codable concepts without codable children: Secondary | ICD-10-CM | POA: Insufficient documentation

## 2010-10-03 ENCOUNTER — Telehealth: Payer: Self-pay | Admitting: *Deleted

## 2010-10-03 MED ORDER — SIMVASTATIN 40 MG PO TABS: 40.0000 mg | ORAL_TABLET | Freq: Every day | ORAL | Status: DC

## 2010-10-03 NOTE — Telephone Encounter (Signed)
Received message from pt requesting her most recent refills be sent to mail order instead of local. Reviewed refills from 09/21/10 to present.  Spoke to pt and advised her that we will wait until her next TSH check in 6 weeks before we will know if her dose is in therapeutic range and will do local supply of levothyroxine at present. Cannot do Lyrica via mail order ([facility]) per Melissa. Will send simvastatin via mail order. Refill sent.

## 2010-10-04 ENCOUNTER — Ambulatory Visit: Payer: BC Managed Care – PPO | Admitting: Physical Therapy

## 2010-10-06 ENCOUNTER — Ambulatory Visit: Payer: BC Managed Care – PPO | Admitting: Rehabilitation

## 2010-10-11 ENCOUNTER — Ambulatory Visit: Payer: BC Managed Care – PPO | Admitting: Physical Therapy

## 2010-10-13 ENCOUNTER — Ambulatory Visit: Payer: BC Managed Care – PPO | Admitting: Physical Therapy

## 2010-10-13 ENCOUNTER — Telehealth: Payer: Self-pay | Admitting: *Deleted

## 2010-10-13 MED ORDER — ALBUTEROL SULFATE (2.5 MG/3ML) 0.083% IN NEBU: 2.5000 mg | INHALATION_SOLUTION | Freq: Four times a day (QID) | RESPIRATORY_TRACT | Status: DC | PRN

## 2010-10-13 NOTE — Telephone Encounter (Signed)
Pt left voice message requesting refill on albuterol nebules to be sent to [facility] on [address]. Refill completed and pt notified.

## 2010-10-18 ENCOUNTER — Encounter: Payer: Self-pay | Admitting: Family

## 2010-10-18 ENCOUNTER — Ambulatory Visit (INDEPENDENT_AMBULATORY_CARE_PROVIDER_SITE_OTHER): Payer: BC Managed Care – PPO | Admitting: Family

## 2010-10-18 ENCOUNTER — Ambulatory Visit: Payer: BC Managed Care – PPO | Admitting: Physical Therapy

## 2010-10-18 DIAGNOSIS — J45909 Unspecified asthma, uncomplicated: Secondary | ICD-10-CM

## 2010-10-18 MED ORDER — FLUTICASONE-SALMETEROL 250-50 MCG/DOSE IN AEPB: 1.0000 | INHALATION_SPRAY | Freq: Two times a day (BID) | RESPIRATORY_TRACT | Status: DC

## 2010-10-18 MED ORDER — PREDNISONE 10 MG PO TABS: ORAL_TABLET | ORAL | Status: DC

## 2010-10-18 NOTE — Assessment & Plan Note (Signed)
Deteriorated.  Will have pt resume advair, will also treat with prednisone taper.

## 2010-10-18 NOTE — Patient Instructions (Signed)
Prednisone 10mg - take 4 tabs once daily for 2 days, then 3 tabs once daily for 2 days, then 2 tabs once daily for 2 days, then one tab once daily for 2 days, then stop. Call if your symptoms worsen or if you are not improved in 48-72 hours.

## 2010-10-18 NOTE — Progress Notes (Signed)
Subjective:    Patient ID: Michelle Kennedy, female    DOB: 06-Jan-1956, 55 y.o.   MRN: [id number]  HPI  Michelle Kennedy is a 55 yr old female who presents today with complaint of asthma.  Reports that she used her nebulizer (albuterol) with temporary improvement.  Feels like I have a "band" around my lungs. Denies fever.  Cough is dry.  Cough started 3 weeks ago.    Review of Systems See HPI  Past Medical History  Diagnosis Date  . Arthritis   . Asthma   . GERD (gastroesophageal reflux disease)   . Depression   . Allergy   . Hypertension   . Hyperlipidemia   . Diverticulitis 06/2009  . Migraine   . History of esophageal ulcer   . Colon polyps   . Cancer 2000    thyroid  . Urinary incontinence   . Hx: UTI (urinary tract infection)     History   Social History  . Marital Status: Married    Spouse Name: N/A    Number of Children: N/A  . 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 Smoking status: Never Smoker   . Smokeless tobacco: Not on file  . Alcohol Use: No  . Drug Use: No  . Sexually Active: Not on file   Other Topics Concern  . Not on file   Social History Narrative   Regular exercise: yes    Past Surgical History  Procedure Date  . Cholecystectomy 1990  . Breast surgery 2011    left breast biposy  . Appendectomy 2004  . Tonsillectomy and adenoidectomy 2000  . Abdominal hysterectomy 2004    also had bladder sling  . Thyroidectomy 2000  . Colonoscopy w/ polypectomy 06/2009    Family History  Problem Relation Age of Onset  . Alcohol abuse Mother   . Arthritis Mother   . Cancer Mother     ovarian, breast  . Hypertension Mother   . Bipolar disorder Mother   . Alcohol abuse Father   . Cancer Father     lung  . Hyperlipidemia Father   . Kidney disease Father   . Diabetes Father   . Arthritis Maternal Grandmother   . Diabetes Paternal Grandmother     Allergies  Allergen Reactions  . Aspirin    REACTION: stomach sensitivity  . Codeine     REACTION: rash, dyspnea  . Erythromycin     REACTION: rash, dyspnea  . Iodine     REACTION: heart stopped  . Latex     REACTION: Swelling  . Lidocaine     REACTION: throat swelling  . Penicillins     REACTION: rash  . Pentazocine Lactate     REACTION: hives, difficulty swallowing  . Sulfonamide Derivatives     REACTION: rash    Current Outpatient Prescriptions on File Prior to Visit  Medication Sig Dispense Refill  . albuterol (PROVENTIL) (2.5 MG/3ML) 0.083% nebulizer solution Take 3 mLs (2.5 mg total) by nebulization every 6 (six) hours as needed.  75 mL  1  . albuterol (VENTOLIN HFA) 108 (90 BASE) MCG/ACT inhaler Inhale 2 puffs into the lungs every 6 (six) hours as needed.  1 Inhaler  1  . Calcium Carbonate (CALCIUM 500 PO) Take 1 tablet by mouth 2 (two) times daily.        . cholecalciferol (VITAMIN D) 1000 UNITS tablet Take 1,000 Units by  mouth daily.        Marland Kitchen EPINEPHrine (EPI-PEN) 0.3 mg/0.3 mL DEVI Inject 0.3 mg into the muscle once. For severe allergic reaction.       Marland Kitchen estrogens, conjugated, (PREMARIN) 0.3 MG tablet Take 1 tablet by mouth every other day for 1 month, then every 3rd day for 1 month then stop.  30 tablet  1  . levothyroxine (SYNTHROID, LEVOTHROID) 175 MCG tablet Take 1 tablet (175 mcg total) by mouth daily.  30 tablet  1  . loratadine (CLARITIN) 10 MG tablet Take 10 mg by mouth as needed.        . loteprednol (LOTEMAX) 0.5 % ophthalmic suspension Place 1 drop into both eyes 3 (three) times daily. For 1 week.       . meloxicam (MOBIC) 7.5 MG tablet Take 7.5 mg by mouth daily as needed. For pain.       . moexipril (UNIVASC) 15 MG tablet Take 15 mg by mouth daily.        . Multiple Vitamin (MULTIVITAMIN) tablet Take 1 tablet by mouth daily.        . naratriptan (AMERGE) 2.5 MG tablet Take 2.5 mg by mouth as needed. Take one (1) tablet at onset of headache; if returns or does not resolve, may repeat after 4 hours; do not  exceed five (5) mg in 24 hours.       . Omega-3 Fatty Acids (FISH OIL) 1200 MG CAPS Take 1 capsule by mouth daily.        Marland Kitchen omeprazole (PRILOSEC) 20 MG capsule Take 20 mg by mouth 2 (two) times daily.        . ondansetron (ZOFRAN) 4 MG tablet Take 1 tablet (4 mg total) by mouth every 8 (eight) hours as needed for nausea.  15 tablet  0  . pregabalin (LYRICA) 75 MG capsule Take 1 capsule (75 mg total) by mouth 2 (two) times daily.  60 capsule  0  . simvastatin (ZOCOR) 40 MG tablet Take 1 tablet (40 mg total) by mouth daily.  90 tablet  1  . traMADol (ULTRAM-ER) 100 MG 24 hr tablet Take 100 mg by mouth daily as needed. For pain       . valACYclovir (VALTREX) 1000 MG tablet Take 1 tablet twice a day for 10 days then 1/2 tablet (500mg ) once a day for 5 months.       . zolpidem (AMBIEN) 10 MG tablet Take 10 mg by mouth at bedtime as needed.        Marland Kitchen DISCONTD: Fluticasone-Salmeterol (ADVAIR DISKUS) 250-50 MCG/DOSE AEPB Inhale 1 puff into the lungs 2 (two) times daily.          BP 122/70  Pulse 72  Temp(Src) 97.9 F (36.6 C) (Oral)  Resp 16  Ht 5' 7.75" (1.721 m)  Wt 237 lb 1.3 oz (107.539 kg)  BMI 36.31 kg/m2  SpO2 99%       Objective:   Physical Exam Gen: awake, alert, NAd CV: s1/s2,  RRR Resp: BS CTA bilaterally without wheezes, rales, or rhonchi. + dry hacking cough noted during exam Ext: No peripheral edema.        Assessment & Plan:

## 2010-10-20 ENCOUNTER — Ambulatory Visit: Payer: BC Managed Care – PPO | Admitting: Physical Therapy

## 2010-10-25 ENCOUNTER — Ambulatory Visit: Payer: BC Managed Care – PPO | Attending: Family Medicine | Admitting: Physical Therapy

## 2010-10-25 DIAGNOSIS — M25559 Pain in unspecified hip: Secondary | ICD-10-CM | POA: Insufficient documentation

## 2010-10-25 DIAGNOSIS — IMO0001 Reserved for inherently not codable concepts without codable children: Secondary | ICD-10-CM | POA: Insufficient documentation

## 2010-10-27 ENCOUNTER — Ambulatory Visit: Payer: BC Managed Care – PPO | Admitting: Physical Therapy

## 2010-10-31 ENCOUNTER — Telehealth: Payer: Self-pay | Admitting: Family

## 2010-10-31 MED ORDER — PREGABALIN 75 MG PO CAPS: 75.0000 mg | ORAL_CAPSULE | Freq: Two times a day (BID) | ORAL | Status: DC

## 2010-10-31 NOTE — Telephone Encounter (Signed)
Lyrica 75mg  bid #60 x no refills left on pharmacy voicemail.

## 2010-10-31 NOTE — Telephone Encounter (Signed)
REFILL LYRICA 75MG  CAPSULE QTY 60  TAKE 1 CAPSULE TWICE A DAY LAST FILL 09-05-2010

## 2010-11-01 ENCOUNTER — Ambulatory Visit: Payer: BC Managed Care – PPO | Admitting: Physical Therapy

## 2010-11-01 ENCOUNTER — Ambulatory Visit: Payer: BC Managed Care – PPO | Admitting: Family Medicine

## 2010-11-03 ENCOUNTER — Ambulatory Visit: Payer: BC Managed Care – PPO | Admitting: Physical Therapy

## 2010-11-08 ENCOUNTER — Encounter: Payer: Self-pay | Admitting: Family Medicine

## 2010-11-08 ENCOUNTER — Ambulatory Visit: Payer: BC Managed Care – PPO | Admitting: Physical Therapy

## 2010-11-08 ENCOUNTER — Ambulatory Visit (INDEPENDENT_AMBULATORY_CARE_PROVIDER_SITE_OTHER): Payer: BC Managed Care – PPO | Admitting: Family Medicine

## 2010-11-08 VITALS — BP 128/84 | HR 70 | Temp 97.9°F | Ht 68.0 in | Wt 234.0 lb

## 2010-11-08 DIAGNOSIS — M25562 Pain in left knee: Secondary | ICD-10-CM

## 2010-11-08 DIAGNOSIS — S63509A Unspecified sprain of unspecified wrist, initial encounter: Secondary | ICD-10-CM

## 2010-11-08 DIAGNOSIS — S63502A Unspecified sprain of left wrist, initial encounter: Secondary | ICD-10-CM

## 2010-11-08 DIAGNOSIS — M25569 Pain in unspecified knee: Secondary | ICD-10-CM

## 2010-11-08 NOTE — Progress Notes (Addendum)
Subjective:    Patient ID: Michelle Kennedy, female    DOB: Apr 04, 1956, 55 y.o.   MRN: [id number]  HPI  55 yo F here for f/u left knee pain.  Last OV 5/3: 1. Left knee pain About 1 year ago was walking on stairs, fell and hit kneecap directly causing patellar tendon rupture. Was surgically repaired by Dr. Kirke Corin in [place]. States had popping and intermittent pain in knee prior to this though Most of pain is medial in left knee. Associated with swelling. Has had cortisone injections in past. Denies locking, giving out Did have an MRI when she tore patellar tendon - also had arthroscopy with her repair surgery with no meniscus damage then.  2. Right trochanteric bursitis - improved with therapy, injection, home exercises - ready to transition to home stretches/exercises.  Today: Patient reports trochanteric bursitis is significantly improved now with injection, home stretches/exercises, and having completed therapy.  States on 6/16 she was getting out of tub when she tripped on side, fell down directly on left hand (FOOSH mechanism) and left knee. States knee hurting more (and was prior to this) and injection did not help much. Now with more popping and catching medial and behind kneecap. + swelling and bruising anteriorly Has iced, elevated, wrapped knee and taking OTC meds without much help leading up to her fall and today's appointment. No true locking or giving out.  Also injured left wrist but this has improved since the fall 3 days ago. + swelling but no bruising. Pain is more palmar between distal radius and ulna from where she points. Wearing a wrist brace that her husband used, feels more comfortable in this.  Past Medical History  Diagnosis Date  . Arthritis   . Asthma   . GERD (gastroesophageal reflux disease)   . Depression   . Allergy   . Hypertension   . Hyperlipidemia   . Diverticulitis 06/2009  . Migraine   . History of esophageal ulcer   . Colon polyps   .  Cancer 2000    thyroid  . Urinary incontinence   . Hx: UTI (urinary tract infection)     Current Outpatient Prescriptions on File Prior to Visit  Medication Sig Dispense Refill  . albuterol (PROVENTIL) (2.5 MG/3ML) 0.083% nebulizer solution Take 3 mLs (2.5 mg total) by nebulization every 6 (six) hours as needed.  75 mL  1  . albuterol (VENTOLIN HFA) 108 (90 BASE) MCG/ACT inhaler Inhale 2 puffs into the lungs every 6 (six) hours as needed.  1 Inhaler  1  . Calcium Carbonate (CALCIUM 500 PO) Take 1 tablet by mouth 2 (two) times daily.        . cholecalciferol (VITAMIN D) 1000 UNITS tablet Take 1,000 Units by mouth daily.        Marland Kitchen EPINEPHrine (EPI-PEN) 0.3 mg/0.3 mL DEVI Inject 0.3 mg into the muscle once. For severe allergic reaction.       Marland Kitchen estrogens, conjugated, (PREMARIN) 0.3 MG tablet Take 1 tablet by mouth every other day for 1 month, then every 3rd day for 1 month then stop.  30 tablet  1  . Fluticasone-Salmeterol (ADVAIR DISKUS) 250-50 MCG/DOSE AEPB Inhale 1 puff into the lungs 2 (two) times daily.  60 each  3  . levothyroxine (SYNTHROID, LEVOTHROID) 175 MCG tablet Take 1 tablet (175 mcg total) by mouth daily.  30 tablet  1  . loratadine (CLARITIN) 10 MG tablet Take 10 mg by mouth as needed.        Marland Kitchen  loteprednol (LOTEMAX) 0.5 % ophthalmic suspension Place 1 drop into both eyes 3 (three) times daily. For 1 week.       . meloxicam (MOBIC) 7.5 MG tablet Take 7.5 mg by mouth daily as needed. For pain.       . moexipril (UNIVASC) 15 MG tablet Take 15 mg by mouth daily.        . Multiple Vitamin (MULTIVITAMIN) tablet Take 1 tablet by mouth daily.        . naratriptan (AMERGE) 2.5 MG tablet Take 2.5 mg by mouth as needed. Take one (1) tablet at onset of headache; if returns or does not resolve, may repeat after 4 hours; do not exceed five (5) mg in 24 hours.       . Omega-3 Fatty Acids (FISH OIL) 1200 MG CAPS Take 1 capsule by mouth daily.        Marland Kitchen omeprazole (PRILOSEC) 20 MG capsule Take 20 mg  by mouth 2 (two) times daily.        . ondansetron (ZOFRAN) 4 MG tablet Take 1 tablet (4 mg total) by mouth every 8 (eight) hours as needed for nausea.  15 tablet  0  . predniSONE (DELTASONE) 10 MG tablet Take as directed.  20 tablet  0  . pregabalin (LYRICA) 75 MG capsule Take 1 capsule (75 mg total) by mouth 2 (two) times daily.  60 capsule  0  . simvastatin (ZOCOR) 40 MG tablet Take 1 tablet (40 mg total) by mouth daily.  90 tablet  1  . traMADol (ULTRAM-ER) 100 MG 24 hr tablet Take 100 mg by mouth daily as needed. For pain       . valACYclovir (VALTREX) 1000 MG tablet Take 1 tablet twice a day for 10 days then 1/2 tablet (500mg ) once a day for 5 months.       . zolpidem (AMBIEN) 10 MG tablet Take 10 mg by mouth at bedtime as needed.          Past Surgical History  Procedure Date  . Cholecystectomy 1990  . Breast surgery 2011    left breast biposy  . Appendectomy 2004  . Tonsillectomy and adenoidectomy 2000  . Abdominal hysterectomy 2004    also had bladder sling  . Thyroidectomy 2000  . Colonoscopy w/ polypectomy 06/2009    Allergies  Allergen Reactions  . Aspirin     REACTION: stomach sensitivity  . Codeine     REACTION: rash, dyspnea  . Erythromycin     REACTION: rash, dyspnea  . Iodine     REACTION: heart stopped  . Latex     REACTION: Swelling  . Lidocaine     REACTION: throat swelling  . Penicillins     REACTION: rash  . Pentazocine Lactate     REACTION: hives, difficulty swallowing  . Sulfonamide Derivatives     REACTION: rash    History   Social History  . Marital Status: Married    Spouse Name: N/A    Number of Children: N/A  . 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 Smoking status: Never Smoker   . Smokeless tobacco: Not on file  . Alcohol Use: No  . Drug Use: No  . Sexually Active: Not on file   Other Topics Concern  . Not on file   Social History Narrative   Regular exercise: yes     Family History  Problem Relation Age  of Onset  . Alcohol abuse Mother   . Arthritis Mother   . Cancer Mother     ovarian, breast  . Hypertension Mother   . Bipolar disorder Mother   . Alcohol abuse Father   . Cancer Father     lung  . Hyperlipidemia Father   . Kidney disease Father   . Diabetes Father   . Arthritis Maternal Grandmother   . Diabetes Paternal Grandmother     BP 128/84  Pulse 70  Temp(Src) 97.9 F (36.6 C) (Oral)  Ht 5\' 8"  (1.727 m)  Wt 234 lb (106.142 kg)  BMI 35.58 kg/m2  Review of Systems See HPI above.    Objective:   Physical Exam L knee: Mild effusion and warmth.  No bruising, erythema. Mod TTP medial joint line.  No post patellar, pes, lateral joint line TTP. ROM 0 - 110 degrees. Stable to valgus and varus stress.  Negative ant/post drawers.  Negative lachmanns Mcmurrays and apleys with pain medial joint line but no click. NVI distally.  L wrist: No gross deformity, swelling, bruising. No focal bony TTP including at snuffbox, distal radius and ulna, about hand and fingers. FROM wrist (pain on full extension and flexion), fingers. Strength 5/5 all motions wrist and fingers NVI distally.    Assessment & Plan:  1. Left knee pain - she does have mild arthritis that she may have exacerbated in her fall but would expect injection to have helped quite a bit with this and only had mild relief, now with more mechanical symptoms.  Will obtain MRI to assess for meniscal tear and call patient with results.  Icing, nsaids, ace wrap, elevation in meantime.  2. Left wrist sprain - no focal bony tenderness to warrant radiographs.  Is improving and feels more comfortable in wrist brace.  Continue with brace, icing, nsaids.  Should continue to improve over the next few weeks.  Addendum: no injections given today - note was signed with injection notes from her last office visit.  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

## 2010-11-09 ENCOUNTER — Ambulatory Visit (HOSPITAL_BASED_OUTPATIENT_CLINIC_OR_DEPARTMENT_OTHER)
Admission: RE | Admit: 2010-11-09 | Discharge: 2010-11-09 | Disposition: A | Discharge status: Home / Self Care | Payer: BC Managed Care – PPO | Source: Ambulatory Visit | Attending: Family Medicine | Admitting: Family Medicine

## 2010-11-09 DIAGNOSIS — M1710 Unilateral primary osteoarthritis, unspecified knee: Secondary | ICD-10-CM | POA: Insufficient documentation

## 2010-11-09 DIAGNOSIS — M25569 Pain in unspecified knee: Secondary | ICD-10-CM | POA: Insufficient documentation

## 2010-11-09 DIAGNOSIS — M25562 Pain in left knee: Secondary | ICD-10-CM

## 2010-11-09 DIAGNOSIS — IMO0002 Reserved for concepts with insufficient information to code with codable children: Secondary | ICD-10-CM | POA: Insufficient documentation

## 2010-11-09 DIAGNOSIS — S63502A Unspecified sprain of left wrist, initial encounter: Secondary | ICD-10-CM | POA: Insufficient documentation

## 2010-11-09 DIAGNOSIS — M25869 Other specified joint disorders, unspecified knee: Secondary | ICD-10-CM | POA: Insufficient documentation

## 2010-11-09 DIAGNOSIS — M2240 Chondromalacia patellae, unspecified knee: Secondary | ICD-10-CM | POA: Insufficient documentation

## 2010-11-09 DIAGNOSIS — R609 Edema, unspecified: Secondary | ICD-10-CM | POA: Insufficient documentation

## 2010-11-09 DIAGNOSIS — M25469 Effusion, unspecified knee: Secondary | ICD-10-CM

## 2010-11-09 NOTE — Assessment & Plan Note (Signed)
no focal bony tenderness to warrant radiographs.  Is improving and feels more comfortable in wrist brace.  Continue with brace, icing, nsaids.  Should continue to improve over the next few weeks.

## 2010-11-09 NOTE — Assessment & Plan Note (Signed)
she does have mild arthritis that she may have exacerbated in her fall but would expect injection to have helped quite a bit with this and only had mild relief, now with more mechanical symptoms.  Will obtain MRI to assess for meniscal tear and call patient with results.  Icing, nsaids, ace wrap, elevation in meantime.

## 2010-11-10 ENCOUNTER — Ambulatory Visit: Payer: BC Managed Care – PPO | Admitting: Physical Therapy

## 2010-11-15 ENCOUNTER — Ambulatory Visit (INDEPENDENT_AMBULATORY_CARE_PROVIDER_SITE_OTHER): Payer: BC Managed Care – PPO | Admitting: Family Medicine

## 2010-11-15 ENCOUNTER — Encounter: Payer: BC Managed Care – PPO | Admitting: Physical Therapy

## 2010-11-15 ENCOUNTER — Encounter: Payer: Self-pay | Admitting: Family Medicine

## 2010-11-15 ENCOUNTER — Ambulatory Visit (HOSPITAL_BASED_OUTPATIENT_CLINIC_OR_DEPARTMENT_OTHER)
Admission: RE | Admit: 2010-11-15 | Discharge: 2010-11-15 | Disposition: A | Discharge status: Home / Self Care | Payer: BC Managed Care – PPO | Source: Ambulatory Visit | Attending: Family Medicine | Admitting: Family Medicine

## 2010-11-15 VITALS — BP 119/81 | HR 69 | Temp 97.6°F | Ht 68.0 in | Wt 238.6 lb

## 2010-11-15 DIAGNOSIS — M25532 Pain in left wrist: Secondary | ICD-10-CM

## 2010-11-15 DIAGNOSIS — S63502A Unspecified sprain of left wrist, initial encounter: Secondary | ICD-10-CM

## 2010-11-15 DIAGNOSIS — M25539 Pain in unspecified wrist: Secondary | ICD-10-CM

## 2010-11-15 DIAGNOSIS — M25569 Pain in unspecified knee: Secondary | ICD-10-CM

## 2010-11-15 DIAGNOSIS — S63509A Unspecified sprain of unspecified wrist, initial encounter: Secondary | ICD-10-CM

## 2010-11-15 DIAGNOSIS — M25562 Pain in left knee: Secondary | ICD-10-CM

## 2010-11-15 MED ORDER — MELOXICAM 15 MG PO TABS: 15.0000 mg | ORAL_TABLET | Freq: Every day | ORAL | Status: DC

## 2010-11-15 NOTE — Patient Instructions (Signed)
For chondromalacia of your knee: Avoid painful activities as much as possible (especially squats and lunges, stairs, hills) Straight leg raise, hip abduction (side raises), hip adduction/VMO exercises (leg raises with foot outturned) at least once daily - 3 sets of 15 - can add 2 pound weight if these become too easy. Consider formal physical therapy if not improving over next month. Icing 15 minutes at a time 3-4 times a day as needed. Aleve or meloxicam as needed for pain.  Your wrist x-rays were negative. Continue using the brace for support up to 6 weeks out. Ice 15 minutes at a time. Aleve or meloxicam will help with this also.

## 2010-11-16 ENCOUNTER — Encounter: Payer: Self-pay | Admitting: Family Medicine

## 2010-11-16 NOTE — Assessment & Plan Note (Signed)
2/2 exacerbation of underlying DJD with fall, chondromalacia.  Start home exercise program.  Continue with mobic, ace wrap, elevation, icing as needed.  Consider PT if not improving over next 4-6 weeks.

## 2010-11-16 NOTE — Progress Notes (Signed)
Subjective:    Patient ID: Michelle Kennedy, female    DOB: 02-13-1956, 55 y.o.   MRN: [id number]  HPI   55 yo F here for f/u left knee pain.  5/3: 1. Left knee pain About 1 year ago was walking on stairs, fell and hit kneecap directly causing patellar tendon rupture. Was surgically repaired by Dr. Kirke Corin in [place]. States had popping and intermittent pain in knee prior to this though Most of pain is medial in left knee. Associated with swelling. Has had cortisone injections in past. Denies locking, giving out Did have an MRI when she tore patellar tendon - also had arthroscopy with her repair surgery with no meniscus damage then.  2. Right trochanteric bursitis - improved with therapy, injection, home exercises - ready to transition to home stretches/exercises.  Last OV: Patient reports trochanteric bursitis is significantly improved now with injection, home stretches/exercises, and having completed therapy.  States on 6/16 she was getting out of tub when she tripped on side, fell down directly on left hand (FOOSH mechanism) and left knee. States knee hurting more (and was prior to this) and injection did not help much. Now with more popping and catching medial and behind kneecap. + swelling and bruising anteriorly Has iced, elevated, wrapped knee and taking OTC meds without much help leading up to her fall and today's appointment. No true locking or giving out.  Also injured left wrist but this has improved since the fall 3 days ago. + swelling but no bruising. Pain is more palmar between distal radius and ulna from where she points. Wearing a wrist brace that her husband used, feels more comfortable in this.  Today: Patient states knee has improved. MRI performed showed mild tricompartmental DJD, chondromalacia - she would like to do home exercise program instead of PT at this time so came in today to go over this. Also reports that her wrist is now bruised palmar aspect,  improved with wrist brace.  Has been icing. Wrist wakes her up at nighttime. Motion is full but hurts at full flexion and extension when she moves it.  Past Medical History  Diagnosis Date  . Arthritis   . Asthma   . GERD (gastroesophageal reflux disease)   . Depression   . Allergy   . Hypertension   . Hyperlipidemia   . Diverticulitis 06/2009  . Migraine   . History of esophageal ulcer   . Colon polyps   . Cancer 2000    thyroid  . Urinary incontinence   . Hx: UTI (urinary tract infection)     Current Outpatient Prescriptions on File Prior to Visit  Medication Sig Dispense Refill  . albuterol (PROVENTIL) (2.5 MG/3ML) 0.083% nebulizer solution Take 3 mLs (2.5 mg total) by nebulization every 6 (six) hours as needed.  75 mL  1  . albuterol (VENTOLIN HFA) 108 (90 BASE) MCG/ACT inhaler Inhale 2 puffs into the lungs every 6 (six) hours as needed.  1 Inhaler  1  . Calcium Carbonate (CALCIUM 500 PO) Take 1 tablet by mouth 2 (two) times daily.        . cholecalciferol (VITAMIN D) 1000 UNITS tablet Take 1,000 Units by mouth daily.        Marland Kitchen EPINEPHrine (EPI-PEN) 0.3 mg/0.3 mL DEVI Inject 0.3 mg into the muscle once. For severe allergic reaction.       Marland Kitchen estrogens, conjugated, (PREMARIN) 0.3 MG tablet Take 1 tablet by mouth every other day for 1 month, then every 3rd  day for 1 month then stop.  30 tablet  1  . Fluticasone-Salmeterol (ADVAIR DISKUS) 250-50 MCG/DOSE AEPB Inhale 1 puff into the lungs 2 (two) times daily.  60 each  3  . levothyroxine (SYNTHROID, LEVOTHROID) 175 MCG tablet Take 1 tablet (175 mcg total) by mouth daily.  30 tablet  1  . loratadine (CLARITIN) 10 MG tablet Take 10 mg by mouth as needed.        . loteprednol (LOTEMAX) 0.5 % ophthalmic suspension Place 1 drop into both eyes 3 (three) times daily. For 1 week.       . moexipril (UNIVASC) 15 MG tablet Take 15 mg by mouth daily.        . Multiple Vitamin (MULTIVITAMIN) tablet Take 1 tablet by mouth daily.        .  naratriptan (AMERGE) 2.5 MG tablet Take 2.5 mg by mouth as needed. Take one (1) tablet at onset of headache; if returns or does not resolve, may repeat after 4 hours; do not exceed five (5) mg in 24 hours.       . Omega-3 Fatty Acids (FISH OIL) 1200 MG CAPS Take 1 capsule by mouth daily.        Marland Kitchen omeprazole (PRILOSEC) 20 MG capsule Take 20 mg by mouth 2 (two) times daily.        . ondansetron (ZOFRAN) 4 MG tablet Take 1 tablet (4 mg total) by mouth every 8 (eight) hours as needed for nausea.  15 tablet  0  . pregabalin (LYRICA) 75 MG capsule Take 1 capsule (75 mg total) by mouth 2 (two) times daily.  60 capsule  0  . simvastatin (ZOCOR) 40 MG tablet Take 1 tablet (40 mg total) by mouth daily.  90 tablet  1  . traMADol (ULTRAM-ER) 100 MG 24 hr tablet Take 100 mg by mouth daily as needed. For pain       . valACYclovir (VALTREX) 1000 MG tablet Take 1 tablet twice a day for 10 days then 1/2 tablet (500mg ) once a day for 5 months.       . zolpidem (AMBIEN) 10 MG tablet Take 10 mg by mouth at bedtime as needed.        Marland Kitchen DISCONTD: predniSONE (DELTASONE) 10 MG tablet Take as directed.  20 tablet  0    Past Surgical History  Procedure Date  . Cholecystectomy 1990  . Breast surgery 2011    left breast biposy  . Appendectomy 2004  . Tonsillectomy and adenoidectomy 2000  . Abdominal hysterectomy 2004    also had bladder sling  . Thyroidectomy 2000  . Colonoscopy w/ polypectomy 06/2009    Allergies  Allergen Reactions  . Aspirin     REACTION: stomach sensitivity  . Codeine     REACTION: rash, dyspnea  . Erythromycin     REACTION: rash, dyspnea  . Iodine     REACTION: heart stopped  . Latex     REACTION: Swelling  . Lidocaine     REACTION: throat swelling  . Penicillins     REACTION: rash  . Pentazocine Lactate     REACTION: hives, difficulty swallowing  . Sulfonamide Derivatives     REACTION: rash    History   Social History  . Marital Status: Married    Spouse Name: N/A     Number of Children: N/A  . 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 Smoking  status: Never Smoker   . Smokeless tobacco: Not on file  . Alcohol Use: No  . Drug Use: No  . Sexually Active: Not on file   Other Topics Concern  . Not on file   Social History Narrative   Regular exercise: yes    Family History  Problem Relation Age of Onset  . Alcohol abuse Mother   . Arthritis Mother   . Cancer Mother     ovarian, breast  . Hypertension Mother   . Bipolar disorder Mother   . Alcohol abuse Father   . Cancer Father     lung  . Hyperlipidemia Father   . Kidney disease Father   . Diabetes Father   . Arthritis Maternal Grandmother   . Diabetes Paternal Grandmother     BP 119/81  Pulse 69  Temp(Src) 97.6 F (36.4 C) (Oral)  Ht 5\' 8"  (1.727 m)  Wt 238 lb 9.6 oz (108.228 kg)  BMI 36.28 kg/m2  Review of Systems  See HPI above.    Objective:   Physical Exam  L knee: Mild effusion and warmth.  No bruising, erythema. Minimal TTP medial joint line.  Mild post patellar TTP especially laterally.  No pes, lateral joint line TTP. ROM 0 - 110 degrees. Stable to valgus and varus stress.  Negative ant/post drawers.  Negative lachmanns Mcmurrays and apleys negative. NVI distally.  L wrist: Bruising volar aspect of wrist.  Minimal swelling.  No deformity. TTP over bruising, more radial aspect than ulnar of wrist but no radius, ulna TTP. FROM wrist (pain on full extension and flexion), fingers. Strength 5/5 all motions wrist and fingers NVI distally.    Assessment & Plan:  1. Left knee pain - 2/2 exacerbation of underlying DJD with fall, chondromalacia.  Start home exercise program.  Continue with mobic, ace wrap, elevation, icing as needed.  Consider PT if not improving over next 4-6 weeks.  2. Left wrist pain - x-rays including carpal tunnel view negative for fracture.  2/2 contusion.  Continue icing, brace, mobic.  Should  resolve over next 1-3 weeks.

## 2010-11-16 NOTE — Assessment & Plan Note (Signed)
Contusion and sprain (most likely contusion).  x-rays including carpal tunnel view negative for fracture.  Continue icing, brace, mobic.  Should resolve over next 1-3 weeks.

## 2010-11-17 ENCOUNTER — Encounter: Payer: BC Managed Care – PPO | Admitting: Rehabilitation

## 2010-12-22 ENCOUNTER — Telehealth: Payer: Self-pay | Admitting: *Deleted

## 2010-12-22 NOTE — Telephone Encounter (Signed)
Received message from pt that she is needing refills on Moexipril and Premarin. Refill sent for Moexipril. Advised pt that per 09/21/10 office note, she was supposed to take Premarin every other day for 1 month then every 3rd day for 1 month and then stop. Pt states she took med every other day for both months and wants to know how she should proceed with the weaning of Premarin. Pt aware that it will be Monday before this is addressed as Provider is out of the office and pt has another week supply left. Please advise.

## 2010-12-25 NOTE — Telephone Encounter (Signed)
I would like her to take premarin every 3rd day for 1 month and then follow up with me in the office.  Also, does she have an appointment with endocrinology?  OK to give 1 month supply of premarin.

## 2010-12-26 MED ORDER — ESTROGENS CONJUGATED 0.3 MG PO TABS: ORAL_TABLET | ORAL | Status: DC

## 2010-12-26 NOTE — Telephone Encounter (Signed)
Left detailed message on pt's home number re: directions below and to call with questions and to arrange f/u appt. Premarin rx sent to [facility] [address]

## 2010-12-26 NOTE — Telephone Encounter (Signed)
Left message on machine to return my call. 

## 2010-12-28 ENCOUNTER — Telehealth: Payer: Self-pay | Admitting: *Deleted

## 2010-12-28 DIAGNOSIS — N951 Menopausal and female climacteric states: Secondary | ICD-10-CM

## 2010-12-28 MED ORDER — MOEXIPRIL HCL 15 MG PO TABS: 15.0000 mg | ORAL_TABLET | Freq: Every day | ORAL | Status: DC

## 2010-12-28 NOTE — Telephone Encounter (Signed)
I would recommend that she see GYN for further management of her hormone replacement therapy.   I will be happy to arrange consult if she does not already have a GYN.

## 2010-12-28 NOTE — Telephone Encounter (Signed)
Pt requested refill on Moexipril #30 x 1 refill. Pt states that she is still having terrible hot flashes. Wants to know if she can take Vivelle dot after weaning off of Premarin. Please advise.

## 2010-12-28 NOTE — Telephone Encounter (Signed)
Pt notified and would like to proceed with referral.

## 2011-02-14 ENCOUNTER — Encounter: Payer: Self-pay | Admitting: Family

## 2011-02-14 ENCOUNTER — Ambulatory Visit (INDEPENDENT_AMBULATORY_CARE_PROVIDER_SITE_OTHER): Payer: BC Managed Care – PPO | Admitting: Family

## 2011-02-14 DIAGNOSIS — H9313 Tinnitus, bilateral: Secondary | ICD-10-CM

## 2011-02-14 DIAGNOSIS — H9319 Tinnitus, unspecified ear: Secondary | ICD-10-CM

## 2011-02-14 MED ORDER — LORATADINE-PSEUDOEPHEDRINE ER 5-120 MG PO TB12: 1.0000 | ORAL_TABLET | Freq: Two times a day (BID) | ORAL | Status: AC

## 2011-02-14 NOTE — Patient Instructions (Signed)
Call if your symptoms worsen or if they do not improve.   

## 2011-02-14 NOTE — Assessment & Plan Note (Signed)
I suspect that this is related to fluid in both ears- likely due to allergies.  Does not appear to be infected at this point.  Will change her claritin to claritin D.  She is instructed to call if symptoms worsen, do not improve, or if she develops ear pain. Pt verbalizes understanding.

## 2011-02-14 NOTE — Progress Notes (Signed)
Subjective:    Patient ID: Michelle Kennedy, female    DOB: [birth year], 55 y.o.   MRN: [id number]  HPI  Michelle Kennedy is a 55 yr old female who presents today with chief complaint of tinnitus. Symptoms started about 1 week ago.  Denies nasal congestion, or ear pain or fever. Had cold about 2 weeks ago which was "mostly in my throat." this is resolved.   She has hearing aids that she usually wears and tells me that the ringing is unchanged when she wears her hearing aids.   Review of Systems See HPI  Past Medical History  Diagnosis Date  . Arthritis   . Asthma   . GERD (gastroesophageal reflux disease)   . Depression   . Allergy   . Hypertension   . Hyperlipidemia   . Diverticulitis 06/2009  . Migraine   . History of esophageal ulcer   . Colon polyps   . Cancer 2000    thyroid  . Urinary incontinence   . Hx: UTI (urinary tract infection)     History   Social History  . Marital Status: Married    Spouse Name: N/A    Number of Children: N/A  . 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 Smoking status: Never Smoker   . Smokeless tobacco: Not on file  . Alcohol Use: No  . Drug Use: No  . Sexually Active: Not on file   Other Topics Concern  . Not on file   Social History Narrative   Regular exercise: yes    Past Surgical History  Procedure Date  . Cholecystectomy 1990  . Breast surgery 2011    left breast biposy  . Appendectomy 2004  . Tonsillectomy and adenoidectomy 2000  . Abdominal hysterectomy 2004    also had bladder sling  . Thyroidectomy 2000  . Colonoscopy w/ polypectomy 06/2009    Family History  Problem Relation Age of Onset  . Alcohol abuse Mother   . Arthritis Mother   . Cancer Mother     ovarian, breast  . Hypertension Mother   . Bipolar disorder Mother   . Alcohol abuse Father   . Cancer Father     lung  . Hyperlipidemia Father   . Kidney disease Father   . Diabetes Father   . Arthritis  Maternal Grandmother   . Diabetes Paternal Grandmother     Allergies  Allergen Reactions  . Aspirin     REACTION: stomach sensitivity  . Codeine     REACTION: rash, dyspnea  . Erythromycin     REACTION: rash, dyspnea  . Iodine     REACTION: heart stopped  . Latex     REACTION: Swelling  . Lidocaine     REACTION: throat swelling  . Penicillins     REACTION: rash  . Pentazocine Lactate     REACTION: hives, difficulty swallowing  . Sulfonamide Derivatives     REACTION: rash    Current Outpatient Prescriptions on File Prior to Visit  Medication Sig Dispense Refill  . albuterol (PROVENTIL) (2.5 MG/3ML) 0.083% nebulizer solution Take 3 mLs (2.5 mg total) by nebulization every 6 (six) hours as needed.  75 mL  1  . albuterol (VENTOLIN HFA) 108 (90 BASE) MCG/ACT inhaler Inhale 2 puffs into the lungs every 6 (six) hours as needed.  1 Inhaler  1  . Calcium Carbonate (CALCIUM 500 PO) Take 1 tablet by  mouth 2 (two) times daily.        . cholecalciferol (VITAMIN D) 1000 UNITS tablet Take 1,000 Units by mouth daily.        Marland Kitchen EPINEPHrine (EPI-PEN) 0.3 mg/0.3 mL DEVI Inject 0.3 mg into the muscle once. For severe allergic reaction.       . Fluticasone-Salmeterol (ADVAIR DISKUS) 250-50 MCG/DOSE AEPB Inhale 1 puff into the lungs 2 (two) times daily.  60 each  3  . levothyroxine (SYNTHROID, LEVOTHROID) 175 MCG tablet Take 1 tablet (175 mcg total) by mouth daily.  30 tablet  1  . meloxicam (MOBIC) 15 MG tablet Take 1 tablet (15 mg total) by mouth daily. With food.  30 tablet  1  . moexipril (UNIVASC) 15 MG tablet Take 1 tablet (15 mg total) by mouth daily.  30 tablet  1  . Multiple Vitamin (MULTIVITAMIN) tablet Take 1 tablet by mouth daily.        . naratriptan (AMERGE) 2.5 MG tablet Take 2.5 mg by mouth as needed. Take one (1) tablet at onset of headache; if returns or does not resolve, may repeat after 4 hours; do not exceed five (5) mg in 24 hours.       . Omega-3 Fatty Acids (FISH OIL) 1200 MG  CAPS Take 1 capsule by mouth daily.        Marland Kitchen omeprazole (PRILOSEC) 20 MG capsule Take 20 mg by mouth 2 (two) times daily.        . ondansetron (ZOFRAN) 4 MG tablet Take 1 tablet (4 mg total) by mouth every 8 (eight) hours as needed for nausea.  15 tablet  0  . pregabalin (LYRICA) 75 MG capsule Take 1 capsule (75 mg total) by mouth 2 (two) times daily.  60 capsule  0  . simvastatin (ZOCOR) 40 MG tablet Take 1 tablet (40 mg total) by mouth daily.  90 tablet  1  . traMADol (ULTRAM-ER) 100 MG 24 hr tablet Take 100 mg by mouth daily as needed. For pain       . valACYclovir (VALTREX) 1000 MG tablet Take 1 tablet twice a day for 10 days then 1/2 tablet (500mg ) once a day for 5 months.       . zolpidem (AMBIEN) 10 MG tablet Take 10 mg by mouth at bedtime as needed.          BP 120/82  Pulse 72  Temp(Src) 98.2 F (36.8 C) (Oral)  Resp 16  Ht 5' 7.75" (1.721 m)  Wt 238 lb (107.956 kg)  BMI 36.46 kg/m2       Objective:   Physical Exam  Constitutional: She appears well-developed and well-nourished.  HENT:  Mouth/Throat: Uvula is midline, oropharynx is clear and moist and mucous membranes are normal.       Bilateral TM's are dull without erythema or bulging.  Cardiovascular: Normal rate and regular rhythm.   No murmur heard. Pulmonary/Chest: Effort normal and breath sounds normal. No respiratory distress. She has no wheezes. She has no rales. She exhibits no tenderness.  Musculoskeletal: She exhibits no edema.  Skin: Skin is warm and dry. No rash noted. No erythema. No pallor.  Psychiatric: She has a normal mood and affect. Her behavior is normal. Judgment and thought content normal.          Assessment & Plan:

## 2011-02-16 ENCOUNTER — Telehealth: Payer: Self-pay | Admitting: *Deleted

## 2011-02-16 NOTE — Telephone Encounter (Signed)
Yes, I will refer her to ENT for further evaluation.

## 2011-02-16 NOTE — Telephone Encounter (Signed)
Received message from pt that she is still having ringing in both ears. Has been taking Claritin D without relief. States the ringing was more intense yesterday but has lessened slightly today. Wants to know if she should be referred to ENT? Please advise.

## 2011-02-17 NOTE — Telephone Encounter (Signed)
Notified pt. 

## 2011-03-25 ENCOUNTER — Other Ambulatory Visit: Payer: Self-pay | Admitting: Family

## 2011-03-27 NOTE — Telephone Encounter (Signed)
Pt last seen in November for acute illness. Has no follow up on file. When should pt f/u with [place]? Please advise re: refills.

## 2011-03-29 ENCOUNTER — Telehealth: Payer: Self-pay | Admitting: Family

## 2011-03-29 NOTE — Telephone Encounter (Signed)
Refill sent per authorization below.

## 2011-03-29 NOTE — Telephone Encounter (Signed)
Opened in errpr

## 2011-03-29 NOTE — Telephone Encounter (Signed)
OK to send #30 with 2 refills.  Follow up in 3 months. She is due for a BMET now please (HTN). Thanks

## 2011-03-30 NOTE — Telephone Encounter (Signed)
Pt notified and states she will have to call us back to schedule 3 month f/u as she is in the process of moving. Pt requests refill on Zomig for her migraines. States she used to take this in the past. She will be traveling the week of thanksgiving and states the high altitude will trigger her migraines. Doesn't remember her previous dose. [facility]. Please advise.

## 2011-04-03 MED ORDER — ZOLMITRIPTAN 2.5 MG PO TABS: 2.5000 mg | ORAL_TABLET | ORAL | Status: AC | PRN

## 2011-04-03 NOTE — Telephone Encounter (Signed)
Pt notified that Zomig was sent to pharmacy.

## 2011-04-05 ENCOUNTER — Other Ambulatory Visit: Payer: Self-pay | Admitting: Family

## 2011-04-05 ENCOUNTER — Telehealth: Payer: Self-pay | Admitting: *Deleted

## 2011-04-05 NOTE — Telephone Encounter (Signed)
[facility] that [facility] will require prior auth. Contacted pt and she states she has tries other alternatives in the past that were not effective. Pt will call me back with list of failed alternatives when she is back at home.

## 2011-04-06 ENCOUNTER — Telehealth: Payer: Self-pay | Admitting: Family

## 2011-04-06 ENCOUNTER — Encounter: Payer: Self-pay | Admitting: Family

## 2011-04-06 NOTE — Telephone Encounter (Signed)
Please let pt know that her TSH is normal and she should continue current dose of synthroid. Please confirm dose of synthroid is .  She may have 30 tabs with 2 refills.

## 2011-04-07 NOTE — Telephone Encounter (Signed)
Notified pt. She states her thyroid is being managed by March Rummage, PA for Dr Allena Katz. Current dose is daily with an extra tablet 1 day a week.

## 2011-04-07 NOTE — Telephone Encounter (Signed)
Pt called stating she has tried Imitrex and Relpax in the past without successful management of symptoms. PA form completed and forwarded to Provider for signature.

## 2011-04-11 NOTE — Telephone Encounter (Signed)
Called [employer] [place] [phone] and spoke to [place] re: status of PA request. Process completed over the phone and med approved x 2 years. Letter will be mailed to [place] and pt. [facility] and [facility].

## 2011-04-11 NOTE — Telephone Encounter (Signed)
P.A. Form faxed to [facility] [phone] on 04/07/11 at 5:05pm. Awaiting approval/denial status.

## 2011-05-23 HISTORY — PX: EYE SURGERY: SHX253

## 2011-05-25 ENCOUNTER — Other Ambulatory Visit: Payer: Self-pay | Admitting: Family

## 2011-05-25 NOTE — Telephone Encounter (Signed)
Pt is due for f/u this month. Please call pt to arrange appt. 90 day supply x no refill of simvastatin has been sent to mail order.

## 2011-05-25 NOTE — Telephone Encounter (Signed)
Left detailed message that a 90 day supply with no refills of simvastatin has been sent in to mail order pharmacy and that patient needs appt before additional refills can be given.

## 2011-05-31 ENCOUNTER — Encounter: Payer: Self-pay | Admitting: Family

## 2011-05-31 ENCOUNTER — Ambulatory Visit (INDEPENDENT_AMBULATORY_CARE_PROVIDER_SITE_OTHER): Payer: BC Managed Care – PPO | Admitting: Family

## 2011-05-31 ENCOUNTER — Ambulatory Visit (INDEPENDENT_AMBULATORY_CARE_PROVIDER_SITE_OTHER): Payer: BC Managed Care – PPO | Admitting: Family Medicine

## 2011-05-31 ENCOUNTER — Encounter: Payer: Self-pay | Admitting: Family Medicine

## 2011-05-31 VITALS — BP 118/76 | HR 84 | Temp 97.6°F | Resp 18 | Ht 67.75 in | Wt 244.1 lb

## 2011-05-31 VITALS — BP 135/85 | HR 76 | Temp 97.6°F | Ht 68.0 in | Wt 244.0 lb

## 2011-05-31 DIAGNOSIS — G43909 Migraine, unspecified, not intractable, without status migrainosus: Secondary | ICD-10-CM

## 2011-05-31 DIAGNOSIS — M25569 Pain in unspecified knee: Secondary | ICD-10-CM

## 2011-05-31 DIAGNOSIS — D229 Melanocytic nevi, unspecified: Secondary | ICD-10-CM

## 2011-05-31 DIAGNOSIS — M25562 Pain in left knee: Secondary | ICD-10-CM

## 2011-05-31 DIAGNOSIS — D239 Other benign neoplasm of skin, unspecified: Secondary | ICD-10-CM

## 2011-05-31 DIAGNOSIS — E039 Hypothyroidism, unspecified: Secondary | ICD-10-CM

## 2011-05-31 DIAGNOSIS — I10 Essential (primary) hypertension: Secondary | ICD-10-CM

## 2011-05-31 DIAGNOSIS — F329 Major depressive disorder, single episode, unspecified: Secondary | ICD-10-CM

## 2011-05-31 DIAGNOSIS — E785 Hyperlipidemia, unspecified: Secondary | ICD-10-CM

## 2011-05-31 DIAGNOSIS — J45909 Unspecified asthma, uncomplicated: Secondary | ICD-10-CM

## 2011-05-31 DIAGNOSIS — L989 Disorder of the skin and subcutaneous tissue, unspecified: Secondary | ICD-10-CM

## 2011-05-31 LAB — BASIC METABOLIC PANEL
BUN: 17 mg/dL (ref 6–23)
Calcium: 9.4 mg/dL (ref 8.4–10.5)
Creat: 0.83 mg/dL (ref 0.50–1.10)

## 2011-05-31 MED ORDER — EPINEPHRINE 0.3 MG/0.3ML IJ DEVI: 0.3000 mg | Freq: Once | INTRAMUSCULAR | Status: DC

## 2011-05-31 MED ORDER — ALBUTEROL SULFATE HFA 108 (90 BASE) MCG/ACT IN AERS: 2.0000 | INHALATION_SPRAY | Freq: Four times a day (QID) | RESPIRATORY_TRACT | Status: DC | PRN

## 2011-05-31 MED ORDER — MELOXICAM 7.5 MG PO TABS: 7.5000 mg | ORAL_TABLET | Freq: Every day | ORAL | Status: DC

## 2011-05-31 MED ORDER — SIMVASTATIN 40 MG PO TABS: 40.0000 mg | ORAL_TABLET | Freq: Every day | ORAL | Status: DC

## 2011-05-31 MED ORDER — TRAMADOL HCL ER 100 MG PO TB24: 100.0000 mg | ORAL_TABLET | Freq: Every day | ORAL | Status: DC | PRN

## 2011-05-31 MED ORDER — FLUTICASONE-SALMETEROL 250-50 MCG/DOSE IN AEPB: 1.0000 | INHALATION_SPRAY | Freq: Two times a day (BID) | RESPIRATORY_TRACT | Status: DC

## 2011-05-31 MED ORDER — ALBUTEROL SULFATE (2.5 MG/3ML) 0.083% IN NEBU: 2.5000 mg | INHALATION_SOLUTION | Freq: Four times a day (QID) | RESPIRATORY_TRACT | Status: DC | PRN

## 2011-05-31 MED ORDER — MOEXIPRIL HCL 15 MG PO TABS: 15.0000 mg | ORAL_TABLET | Freq: Every day | ORAL | Status: DC

## 2011-05-31 NOTE — Patient Instructions (Signed)
Please complete your lab work prior to leaving.  Follow up in 6 months, sooner if problems or concerns.  

## 2011-05-31 NOTE — Progress Notes (Signed)
Subjective:    Patient ID: Michelle Kennedy, female    DOB: November 04, 1955, 56 y.o.   MRN: [id number]  HPI  Mole on her back-  Hypothyroid- She is followed by March Rummage PA-c.    Depression- Reports that she is feeling better now that she has moved into a house.    Migraine- She reports that this is improved.  She is using zomig.  Asthma- was worst when she was walking outside.    Hyperlipidemia- not taking simvastatin.   Skin lesion- pt has a lesion on her back that she would like evaluated by dermatology.   Review of Systems    see HPI  Past Medical History  Diagnosis Date  . Arthritis   . Asthma   . GERD (gastroesophageal reflux disease)   . Depression   . Allergy   . Hypertension   . Hyperlipidemia   . Diverticulitis 06/2009  . Migraine   . History of esophageal ulcer   . Colon polyps   . Cancer 2000    thyroid  . Urinary incontinence   . Hx: UTI (urinary tract infection)   . HYPOTHYROIDISM 12/20/2009    History   Social History  . Marital Status: Married    Spouse Name: N/A    Number of Children: N/A  . 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 Smoking status: Never Smoker   . Smokeless tobacco: Not on file  . Alcohol Use: No  . Drug Use: No  . Sexually Active: Not on file   Other Topics Concern  . Not on file   Social History Narrative   Regular exercise: yes    Past Surgical History  Procedure Date  . Cholecystectomy 1990  . Breast surgery 2011    left breast biposy  . Appendectomy 2004  . Tonsillectomy and adenoidectomy 2000  . Abdominal hysterectomy 2004    also had bladder sling  . Thyroidectomy 2000  . Colonoscopy w/ polypectomy 06/2009    Family History  Problem Relation Age of Onset  . Alcohol abuse Mother   . Arthritis Mother   . Cancer Mother     ovarian, breast  . Hypertension Mother   . Bipolar disorder Mother   . Alcohol abuse Father   . Cancer Father     lung  .  Hyperlipidemia Father   . Kidney disease Father   . Diabetes Father   . Arthritis Maternal Grandmother   . Diabetes Paternal Grandmother     Allergies  Allergen Reactions  . Aspirin     REACTION: stomach sensitivity  . Codeine     REACTION: rash, dyspnea  . Erythromycin     REACTION: rash, dyspnea  . Iodine     REACTION: heart stopped  . Latex     REACTION: Swelling  . Lidocaine     REACTION: throat swelling  . Penicillins     REACTION: rash  . Pentazocine Lactate     REACTION: hives, difficulty swallowing  . Sulfonamide Derivatives     REACTION: rash    Current Outpatient Prescriptions on File Prior to Visit  Medication Sig Dispense Refill  . estradiol (VIVELLE-DOT) 0.05 MG/24HR Place 1 patch onto the skin. Every 4 days.       Marland Kitchen levothyroxine (SYNTHROID, LEVOTHROID) 175 MCG tablet Take 1 tablet daily and 1 extra tablet 1 day a week.       . loratadine-pseudoephedrine (CLARITIN-D 12  HOUR) 5-120 MG per tablet Take 1 tablet by mouth 2 (two) times daily.  60 tablet  2  . Multiple Vitamin (MULTIVITAMIN) tablet Take 1 tablet by mouth daily.        . Omega-3 Fatty Acids (FISH OIL) 1200 MG CAPS Take 1 capsule by mouth daily.        Marland Kitchen omeprazole (PRILOSEC) 20 MG capsule Take 20 mg by mouth daily.       Marland Kitchen ZOLMitriptan (ZOMIG) 2.5 MG tablet Take 1 tablet (2.5 mg total) by mouth as needed for migraine.  10 tablet  1  . Calcium Carbonate (CALCIUM 500 PO) Take 1 tablet by mouth 2 (two) times daily.          BP 118/76  Pulse 84  Temp(Src) 97.6 F (36.4 C) (Oral)  Resp 18  Ht 5' 7.75" (1.721 m)  Wt 244 lb 1.3 oz (110.714 kg)  BMI 37.39 kg/m2    Objective:   Physical Exam  Constitutional: She appears well-developed and well-nourished. No distress.  Cardiovascular: Normal rate and regular rhythm.   No murmur heard. Pulmonary/Chest: Effort normal and breath sounds normal. No respiratory distress. She has no wheezes. She has no rales.  Psychiatric: She has a normal mood and  affect. Her behavior is normal. Judgment and thought content normal.          Assessment & Plan:

## 2011-06-01 ENCOUNTER — Encounter: Payer: Self-pay | Admitting: Family

## 2011-06-01 ENCOUNTER — Encounter: Payer: Self-pay | Admitting: Family Medicine

## 2011-06-01 NOTE — Progress Notes (Signed)
Subjective:    Patient ID: Michelle Kennedy, female    DOB: 1955-09-03, 56 y.o.   MRN: [id number]  HPI   56 yo F here for f/u left knee pain.  5/3: 1. Left knee pain About 1 year ago was walking on stairs, fell and hit kneecap directly causing patellar tendon rupture. Was surgically repaired by Dr. Kirke Corin in [place]. States had popping and intermittent pain in knee prior to this though Most of pain is medial in left knee. Associated with swelling. Has had cortisone injections in past. Denies locking, giving out Did have an MRI when she tore patellar tendon - also had arthroscopy with her repair surgery with no meniscus damage then.  2. Right trochanteric bursitis - improved with therapy, injection, home exercises - ready to transition to home stretches/exercises.  6/19: Patient reports trochanteric bursitis is significantly improved now with injection, home stretches/exercises, and having completed therapy.  States on 6/16 she was getting out of tub when she tripped on side, fell down directly on left hand (FOOSH mechanism) and left knee. States knee hurting more (and was prior to this) and injection did not help much. Now with more popping and catching medial and behind kneecap. + swelling and bruising anteriorly Has iced, elevated, wrapped knee and taking OTC meds without much help leading up to her fall and today's appointment. No true locking or giving out.  Also injured left wrist but this has improved since the fall 3 days ago. + swelling but no bruising. Pain is more palmar between distal radius and ulna from where she points. Wearing a wrist brace that her husband used, feels more comfortable in this.  11/14/09: Patient states knee has improved. MRI performed showed mild tricompartmental DJD, chondromalacia - she would like to do home exercise program instead of PT at this time so came in today to go over this. Also reports that her wrist is now bruised palmar aspect,  improved with wrist brace.  Has been icing. Wrist wakes her up at nighttime. Motion is full but hurts at full flexion and extension when she moves it.  05/30/09: Patient's knee pain and swelling started recurring over past few months. Has been walking 30-35 minutes a day on treadmill. Doing home exercises intermittently. Taking mobic. Wearing knee brace to help with swelling. Pain mainly anterior in knee. Worse when using stairs. No catching, locking but feels unstable.  Past Medical History  Diagnosis Date  . Arthritis   . Asthma   . GERD (gastroesophageal reflux disease)   . Depression   . Allergy   . Hypertension   . Hyperlipidemia   . Diverticulitis 06/2009  . Migraine   . History of esophageal ulcer   . Colon polyps   . Cancer 2000    thyroid  . Urinary incontinence   . Hx: UTI (urinary tract infection)   . HYPOTHYROIDISM 12/20/2009    Current Outpatient Prescriptions on File Prior to Visit  Medication Sig Dispense Refill  . albuterol (PROVENTIL) (2.5 MG/3ML) 0.083% nebulizer solution Take 3 mLs (2.5 mg total) by nebulization every 6 (six) hours as needed.  75 mL  5  . albuterol (VENTOLIN HFA) 108 (90 BASE) MCG/ACT inhaler Inhale 2 puffs into the lungs every 6 (six) hours as needed.  1 Inhaler  5  . Calcium Carbonate (CALCIUM 500 PO) Take 1 tablet by mouth 2 (two) times daily.        . diphenhydrAMINE (BENADRYL) 25 MG tablet Take 25 mg by mouth at  bedtime as needed.      Marland Kitchen EPINEPHrine (EPI-PEN) 0.3 mg/0.3 mL DEVI Inject 0.3 mLs (0.3 mg total) into the muscle once. For severe allergic reaction.  1 Device  2  . Ergocalciferol (VITAMIN D2) 400 UNITS TABS Take 1 tablet by mouth daily.      Marland Kitchen estradiol (VIVELLE-DOT) 0.05 MG/24HR Place 1 patch onto the skin. Every 4 days.       . Fluticasone-Salmeterol (ADVAIR DISKUS) 250-50 MCG/DOSE AEPB Inhale 1 puff into the lungs 2 (two) times daily.  60 each  5  . levothyroxine (SYNTHROID, LEVOTHROID) 175 MCG tablet Take 1 tablet daily and  1 extra tablet 1 day a week.       . loratadine-pseudoephedrine (CLARITIN-D 12 HOUR) 5-120 MG per tablet Take 1 tablet by mouth 2 (two) times daily.  60 tablet  2  . meloxicam (MOBIC) 7.5 MG tablet Take 1 tablet (7.5 mg total) by mouth daily.  30 tablet  5  . moexipril (UNIVASC) 15 MG tablet Take 1 tablet (15 mg total) by mouth daily.  30 tablet  5  . Multiple Vitamin (MULTIVITAMIN) tablet Take 1 tablet by mouth daily.        . Omega-3 Fatty Acids (FISH OIL) 1200 MG CAPS Take 1 capsule by mouth daily.        Marland Kitchen omeprazole (PRILOSEC) 20 MG capsule Take 20 mg by mouth daily.       . simvastatin (ZOCOR) 40 MG tablet Take 1 tablet (40 mg total) by mouth at bedtime.  90 tablet  1  . traMADol (ULTRAM-ER) 100 MG 24 hr tablet Take 1 tablet (100 mg total) by mouth daily as needed. For pain  20 tablet  0  . ZOLMitriptan (ZOMIG) 2.5 MG tablet Take 1 tablet (2.5 mg total) by mouth as needed for migraine.  10 tablet  1    Past Surgical History  Procedure Date  . Cholecystectomy 1990  . Breast surgery 2011    left breast biposy  . Appendectomy 2004  . Tonsillectomy and adenoidectomy 2000  . Abdominal hysterectomy 2004    also had bladder sling  . Thyroidectomy 2000  . Colonoscopy w/ polypectomy 06/2009    Allergies  Allergen Reactions  . Aspirin     REACTION: stomach sensitivity  . Codeine     REACTION: rash, dyspnea  . Erythromycin     REACTION: rash, dyspnea  . Iodine     REACTION: heart stopped  . Latex     REACTION: Swelling  . Lidocaine     REACTION: throat swelling  . Penicillins     REACTION: rash  . Pentazocine Lactate     REACTION: hives, difficulty swallowing  . Sulfonamide Derivatives     REACTION: rash    History   Social History  . Marital Status: Married    Spouse Name: N/A    Number of Children: N/A  . 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 Smoking status: Never Smoker   . Smokeless tobacco: Not on file  .  Alcohol Use: No  . Drug Use: No  . Sexually Active: Not on file   Other Topics Concern  . Not on file   Social History Narrative   Regular exercise: yes    Family History  Problem Relation Age of Onset  . Alcohol abuse Mother   . Arthritis Mother   . Cancer Mother     ovarian,  breast  . Hypertension Mother   . Bipolar disorder Mother   . Alcohol abuse Father   . Cancer Father     lung  . Hyperlipidemia Father   . Kidney disease Father   . Diabetes Father   . Arthritis Maternal Grandmother   . Diabetes Paternal Grandmother     BP 135/85  Pulse 76  Temp(Src) 97.6 F (36.4 C) (Oral)  Ht 5\' 8"  (1.727 m)  Wt 244 lb (110.678 kg)  BMI 37.10 kg/m2  Review of Systems  See HPI above.    Objective:   Physical Exam  L knee: Large effusion and mild warmth.  No bruising, erythema. Mild TTP medial joint line.  Mild post patellar TTP.  No pes, lateral joint line, patellar tendon TTP. ROM 0 - 90 degrees. Stable to valgus and varus stress.  Negative ant/post drawers.  Negative lachmanns Mcmurrays and apleys negative. NVI distally.    Assessment & Plan:  1. Left knee pain - 2/2 exacerbation of underlying DJD and chondromalacia.  Aspiration and injection done today after ultrasound confirmed effusion and showed patellar tendon appears normal also (has anterior pain and medial pain).  Stressed regularly doing home exercise program for chondromalacia.  Continue mobic, ace wrap, elevation, icing as needed.    After informed written consent patient was lying supine on exam table.  Left knee was prepped with alcohol swab.  Utilizing superolateral approach, 3 mL of marcaine was used for local anesthesia.  Then using an 18g needle on 60cc syringe, 44 mL of clear straw-colored fluid was aspirated from left knee.  Knee was then injected with 3:1 marcaine:depomedrol.  Patient tolerated procedure well without immediate complications

## 2011-06-01 NOTE — Assessment & Plan Note (Signed)
2/2 exacerbation of underlying DJD and chondromalacia.  Aspiration and injection done today after ultrasound confirmed effusion and showed patellar tendon appears normal also (has anterior pain and medial pain).  Stressed regularly doing home exercise program for chondromalacia.  Continue mobic, ace wrap, elevation, icing as needed.    After informed written consent patient was lying supine on exam table.  Left knee was prepped with alcohol swab.  Utilizing superolateral approach, 3 mL of marcaine was used for local anesthesia.  Then using an 18g needle on 60cc syringe, 44 mL of clear straw-colored fluid was aspirated from left knee.  Knee was then injected with 3:1 marcaine:depomedrol.  Patient tolerated procedure well without immediate complications

## 2011-06-02 DIAGNOSIS — L989 Disorder of the skin and subcutaneous tissue, unspecified: Secondary | ICD-10-CM | POA: Insufficient documentation

## 2011-06-02 DIAGNOSIS — E785 Hyperlipidemia, unspecified: Secondary | ICD-10-CM | POA: Insufficient documentation

## 2011-06-02 NOTE — Assessment & Plan Note (Signed)
Stable on PRN zomig. Continue same.

## 2011-06-02 NOTE — Assessment & Plan Note (Signed)
New, refer to derm for evaluation.

## 2011-06-02 NOTE — Assessment & Plan Note (Signed)
Currently stable, monitor

## 2011-06-02 NOTE — Assessment & Plan Note (Signed)
Stable on Cymbalta. Continue same.  

## 2011-06-02 NOTE — Assessment & Plan Note (Signed)
This is being managed by Endocrinology- March Rummage PA-c.

## 2011-06-02 NOTE — Assessment & Plan Note (Signed)
Will resume statin.

## 2011-06-13 ENCOUNTER — Other Ambulatory Visit: Payer: Self-pay | Admitting: Family

## 2011-06-14 NOTE — Telephone Encounter (Signed)
Moexipril request denied as authorization was just sent on 05/31/11 #30 x 5 refills.

## 2011-06-21 ENCOUNTER — Telehealth: Payer: Self-pay | Admitting: *Deleted

## 2011-06-21 NOTE — Telephone Encounter (Signed)
[facility] that prior auth will be required for pt's Advair diskus 250/65mcg. Form received from [facility] and forwarded to Provider for signature / completion.

## 2011-06-22 NOTE — Telephone Encounter (Signed)
Received letter from [facility] that they do not process prior auth for pt's plan. Called [phone] and spoke with [facility]. She states I can fax the previous form from [facility] to [phone] for approval / denial. Form faxed.

## 2011-06-22 NOTE — Telephone Encounter (Signed)
Completed form faxed to [phone]. Awaiting approval / denial status.

## 2011-06-24 ENCOUNTER — Other Ambulatory Visit: Payer: Self-pay | Admitting: Family

## 2011-06-26 NOTE — Telephone Encounter (Signed)
Univasc refill previously sent to pharmacy on 05/31/11 #30 x 5 refills. Note and denial sent to pharmacy and to check refills on file.

## 2011-06-27 MED ORDER — BUDESONIDE-FORMOTEROL FUMARATE 160-4.5 MCG/ACT IN AERO: 2.0000 | INHALATION_SPRAY | Freq: Two times a day (BID) | RESPIRATORY_TRACT | Status: DC

## 2011-06-27 NOTE — Telephone Encounter (Signed)
Pt should stop advair, start symbicort.  Rx sent to pharmacy.

## 2011-06-27 NOTE — Telephone Encounter (Signed)
Received voice message from [place] at [employer] [place] [phone]). She states P.A. For Advair has been denied but they have approved Symbicort and Dulera x 2 years. Please advise.

## 2011-06-27 NOTE — Telephone Encounter (Signed)
Notified pt. 

## 2011-07-04 ENCOUNTER — Telehealth: Payer: Self-pay | Admitting: *Deleted

## 2011-07-04 MED ORDER — MOEXIPRIL HCL 15 MG PO TABS: 15.0000 mg | ORAL_TABLET | Freq: Every day | ORAL | Status: DC

## 2011-07-04 MED ORDER — MELOXICAM 7.5 MG PO TABS: 7.5000 mg | ORAL_TABLET | Freq: Every day | ORAL | Status: DC

## 2011-07-04 NOTE — Telephone Encounter (Signed)
OK to give 90 days with 1 refill.

## 2011-07-04 NOTE — Telephone Encounter (Signed)
Refills sent to mail order 

## 2011-07-04 NOTE — Telephone Encounter (Signed)
Received fax from [facility] requesting refills of Meloxicam 7.5mg  and Moexipril 15mg  for 90 days supply. Please advise re: refills.

## 2011-07-06 ENCOUNTER — Telehealth: Payer: Self-pay | Admitting: Family

## 2011-07-06 NOTE — Telephone Encounter (Signed)
Refills were sent electronically for above medications on 07/04/11; #90 x 1 refill each.

## 2011-09-06 ENCOUNTER — Ambulatory Visit (INDEPENDENT_AMBULATORY_CARE_PROVIDER_SITE_OTHER): Payer: BC Managed Care – PPO | Admitting: Family

## 2011-09-06 ENCOUNTER — Encounter: Payer: Self-pay | Admitting: Family

## 2011-09-06 VITALS — BP 122/80 | HR 76 | Temp 97.6°F | Resp 18 | Ht 67.75 in | Wt 250.0 lb

## 2011-09-06 DIAGNOSIS — IMO0002 Reserved for concepts with insufficient information to code with codable children: Secondary | ICD-10-CM

## 2011-09-06 MED ORDER — DOXYCYCLINE HYCLATE 100 MG PO TABS: 100.0000 mg | ORAL_TABLET | Freq: Two times a day (BID) | ORAL | Status: AC

## 2011-09-06 NOTE — Progress Notes (Signed)
  Subjective:    Patient ID: Michelle Kennedy, female    DOB: 12/21/1955, 56 y.o.   MRN: [id number]  HPI  Michelle Kennedy is a 56 yr old female who presents today with chief complaint of pain on the right ring finger.  She notes soreness and redness on the left side of the nail bed.  Denies drainage. Has been trying topical antibiotics and warm soaks without improvement.  She reports that symptoms started 1 week ago.    Review of Systems See HPI    Objective:   Physical Exam  Constitutional: She appears well-developed and well-nourished. No distress.  Skin:       Right ring finger is noted to have mild erythema adjacent to nail on left.  No drainage.          Assessment & Plan:

## 2011-09-06 NOTE — Assessment & Plan Note (Signed)
Due to multiple drug allergies will plan to treat with doxycycline. She is instructed to call if symptoms worsen or if no improvement.

## 2011-09-06 NOTE — Patient Instructions (Signed)
Paronychia  Paronychia is an inflammatory reaction involving the folds of the skin surrounding the fingernail. This is commonly caused by an infection in the skin around a nail. The most common cause of paronychia is frequent wetting of the hands (as seen with bartenders, food servers, nurses or others who wet their hands). This makes the skin around the fingernail susceptible to infection by bacteria (germs) or fungus. Other predisposing factors are:   Aggressive manicuring.   Nail biting.   Thumb sucking.  The most common cause is a staphylococcal (a type of germ) infection, or a fungal (Candida) infection. When caused by a germ, it usually comes on suddenly with redness, swelling, pus and is often painful. It may get under the nail and form an abscess (collection of pus), or form an abscess around the nail. If the nail itself is infected with a fungus, the treatment is usually prolonged and may require oral medicine for up to one year. Your caregiver will determine the length of time treatment is required. The paronychia caused by bacteria (germs) may largely be avoided by not pulling on hangnails or picking at cuticles. When the infection occurs at the tips of the finger it is called felon. When the cause of paronychia is from the herpes simplex virus (HSV) it is called herpetic whitlow.  TREATMENT   When an abscess is present treatment is often incision and drainage. This means that the abscess must be cut open so the pus can get out. When this is done, the following home care instructions should be followed.  HOME CARE INSTRUCTIONS    It is important to keep the affected fingers very dry. Rubber or plastic gloves over cotton gloves should be used whenever the hand must be placed in water.   Keep wound clean, dry and dressed as suggested by your caregiver between warm soaks or warm compresses.   Soak in warm water for fifteen to twenty minutes three to four times per day for bacterial infections. Fungal  infections are very difficult to treat, so often require treatment for long periods of time.   For bacterial (germ) infections take antibiotics (medicine which kill germs) as directed and finish the prescription, even if the problem appears to be solved before the medicine is gone.   Only take over-the-counter or prescription medicines for pain, discomfort, or fever as directed by your caregiver.  SEEK IMMEDIATE MEDICAL CARE IF:   You have redness, swelling, or increasing pain in the wound.   You notice pus coming from the wound.   You have a fever.   You notice a bad smell coming from the wound or dressing.  Document Released: 11/01/2000 Document Revised: 04/27/2011 Document Reviewed: 07/03/2008  ExitCare Patient Information 2012 [employer].

## 2011-09-18 ENCOUNTER — Ambulatory Visit (INDEPENDENT_AMBULATORY_CARE_PROVIDER_SITE_OTHER): Payer: BC Managed Care – PPO | Admitting: Family

## 2011-09-18 ENCOUNTER — Encounter: Payer: Self-pay | Admitting: Family

## 2011-09-18 DIAGNOSIS — M109 Gout, unspecified: Secondary | ICD-10-CM

## 2011-09-18 MED ORDER — COLCHICINE 0.6 MG PO TABS: ORAL_TABLET | ORAL | Status: DC

## 2011-09-18 NOTE — Progress Notes (Signed)
  Subjective:    Patient ID: Michelle Kennedy, female    DOB: June 27, 1955, 57 y.o.   MRN: [id number]  HPI  Michelle Kennedy is a 56 yr old female who presents today with chief complaint of foot pain.  She reports that foot pain started several days ago and is located at the base of the left great toe on the plantar surface. Pain is worsened with weight bearing.   Review of Systems See HPI    Objective:   Physical Exam  Constitutional: She appears well-developed and well-nourished.  Musculoskeletal:       + erythema and swelling at the base of the left great toe.   Skin: Skin is warm.          Assessment & Plan:

## 2011-09-18 NOTE — Patient Instructions (Signed)
Gout Gout is an inflammatory condition (arthritis) caused by a buildup of uric acid crystals in the joints. Uric acid is a chemical that is normally present in the blood. Under some circumstances, uric acid can form into crystals in your joints. This causes joint redness, soreness, and swelling (inflammation). Repeat attacks are common. Over time, uric acid crystals can form into masses (tophi) near a joint, causing disfigurement. Gout is treatable and often preventable. CAUSES  The disease begins with elevated levels of uric acid in the blood. Uric acid is produced by your body when it breaks down a naturally found substance called purines. This also happens when you eat certain foods such as meats and fish. Causes of an elevated uric acid level include:  Being passed down from parent to child (heredity).   Diseases that cause increased uric acid production (obesity, psoriasis, some cancers).   Excessive alcohol use.   Diet, especially diets rich in meat and seafood.   Medicines, including certain cancer-fighting drugs (chemotherapy), diuretics, and aspirin.   Chronic kidney disease. The kidneys are no longer able to remove uric acid well.   Problems with metabolism.  Conditions strongly associated with gout include:  Obesity.   High blood pressure.   High cholesterol.   Diabetes.  Not everyone with elevated uric acid levels gets gout. It is not understood why some people get gout and others do not. Surgery, joint injury, and eating too much of certain foods are some of the factors that can lead to gout. SYMPTOMS   An attack of gout comes on quickly. It causes intense pain with redness, swelling, and warmth in a joint.   Fever can occur.   Often, only one joint is involved. Certain joints are more commonly involved:   Base of the big toe.   Knee.   Ankle.   Wrist.   Finger.  Without treatment, an attack usually goes away in a few days to weeks. Between attacks, you  usually will not have symptoms, which is different from many other forms of arthritis. DIAGNOSIS  Your caregiver will suspect gout based on your symptoms and exam. Removal of fluid from the joint (arthrocentesis) is done to check for uric acid crystals. Your caregiver will give you a medicine that numbs the area (local anesthetic) and use a needle to remove joint fluid for exam. Gout is confirmed when uric acid crystals are seen in joint fluid, using a special microscope. Sometimes, blood, urine, and X-ray tests are also used. TREATMENT  There are 2 phases to gout treatment: treating the sudden onset (acute) attack and preventing attacks (prophylaxis). Treatment of an Acute Attack  Medicines are used. These include anti-inflammatory medicines or steroid medicines.   An injection of steroid medicine into the affected joint is sometimes necessary.   The painful joint is rested. Movement can worsen the arthritis.   You may use warm or cold treatments on painful joints, depending which works best for you.   Discuss the use of coffee, vitamin C, or cherries with your caregiver. These may be helpful treatment options.  Treatment to Prevent Attacks After the acute attack subsides, your caregiver may advise prophylactic medicine. These medicines either help your kidneys eliminate uric acid from your body or decrease your uric acid production. You may need to stay on these medicines for a very long time. The early phase of treatment with prophylactic medicine can be associated with an increase in acute gout attacks. For this reason, during the first few months   of treatment, your caregiver may also advise you to take medicines usually used for acute gout treatment. Be sure you understand your caregiver's directions. You should also discuss dietary treatment with your caregiver. Certain foods such as meats and fish can increase uric acid levels. Other foods such as dairy can decrease levels. Your caregiver  can give you a list of foods to avoid. HOME CARE INSTRUCTIONS   Do not take aspirin to relieve pain. This raises uric acid levels.   Only take over-the-counter or prescription medicines for pain, discomfort, or fever as directed by your caregiver.   Rest the joint as much as possible. When in bed, keep sheets and blankets off painful areas.   Keep the affected joint raised (elevated).   Use crutches if the painful joint is in your leg.   Drink enough water and fluids to keep your urine clear or pale yellow. This helps your body get rid of uric acid. Do not drink alcoholic beverages. They slow the passage of uric acid.   Follow your caregiver's dietary instructions. Pay careful attention to the amount of protein you eat. Your daily diet should emphasize fruits, vegetables, whole grains, and fat-free or low-fat milk products.   Maintain a healthy body weight.  SEEK MEDICAL CARE IF:   You have an oral temperature above 102 F (38.9 C).   You develop diarrhea, vomiting, or any side effects from medicines.   You do not feel better in 24 hours, or you are getting worse.  SEEK IMMEDIATE MEDICAL CARE IF:   Your joint becomes suddenly more tender and you have:   Chills.   An oral temperature above 102 F (38.9 C), not controlled by medicine.  MAKE SURE YOU:   Understand these instructions.   Will watch your condition.   Will get help right away if you are not doing well or get worse.  Document Released: 05/05/2000 Document Revised: 04/27/2011 Document Reviewed: 08/16/2009 [employer] Patient Information 2012 [employer]. 

## 2011-09-19 DIAGNOSIS — M109 Gout, unspecified: Secondary | ICD-10-CM | POA: Insufficient documentation

## 2011-09-19 NOTE — Assessment & Plan Note (Signed)
Symptoms/exam consistent with gout. Will plan to treat with Colcrys and obtain uric acid level.

## 2011-11-02 ENCOUNTER — Ambulatory Visit (HOSPITAL_BASED_OUTPATIENT_CLINIC_OR_DEPARTMENT_OTHER)
Admission: RE | Admit: 2011-11-02 | Discharge: 2011-11-02 | Disposition: A | Discharge status: Home / Self Care | Payer: BC Managed Care – PPO | Source: Ambulatory Visit | Attending: Family Medicine | Admitting: Family Medicine

## 2011-11-02 ENCOUNTER — Encounter: Payer: Self-pay | Admitting: Family Medicine

## 2011-11-02 ENCOUNTER — Ambulatory Visit (INDEPENDENT_AMBULATORY_CARE_PROVIDER_SITE_OTHER): Payer: BC Managed Care – PPO | Admitting: Family Medicine

## 2011-11-02 VITALS — BP 142/95 | HR 82 | Temp 98.0°F | Ht 68.0 in | Wt 246.0 lb

## 2011-11-02 DIAGNOSIS — S8992XA Unspecified injury of left lower leg, initial encounter: Secondary | ICD-10-CM | POA: Insufficient documentation

## 2011-11-02 DIAGNOSIS — S99929A Unspecified injury of unspecified foot, initial encounter: Secondary | ICD-10-CM

## 2011-11-02 DIAGNOSIS — M1710 Unilateral primary osteoarthritis, unspecified knee: Secondary | ICD-10-CM | POA: Insufficient documentation

## 2011-11-02 DIAGNOSIS — M25469 Effusion, unspecified knee: Secondary | ICD-10-CM | POA: Insufficient documentation

## 2011-11-02 DIAGNOSIS — M25569 Pain in unspecified knee: Secondary | ICD-10-CM | POA: Insufficient documentation

## 2011-11-02 MED ORDER — TRAMADOL HCL 50 MG PO TABS: 50.0000 mg | ORAL_TABLET | Freq: Every evening | ORAL | Status: AC | PRN

## 2011-11-02 NOTE — Progress Notes (Signed)
Subjective:    Patient ID: Michelle Kennedy, female    DOB: 01-25-1956, 56 y.o.   MRN: [id number]  PCP: Sandford Craze  HPI 56 yo F here for left knee injury.  Patient reports on 6/11 she was walking and a cat was underneath her - fell to her left side leading to fairly severe anterior left knee pain - thinks she also landed on outside of left knee. Has had trouble ambulating though she has improved since then. Has been icing, taking ibuprofen, staying off this leg as much as possible. + swelling and anterior bruising. This is same knee she had patellar tendon repair on. No catching or locking but hasn't been bending knee a lot.  Past Medical History  Diagnosis Date  . Arthritis   . Asthma   . GERD (gastroesophageal reflux disease)   . Depression   . Allergy   . Hypertension   . Hyperlipidemia   . Diverticulitis 06/2009  . Migraine   . History of esophageal ulcer   . Colon polyps   . Cancer 2000    thyroid  . Urinary incontinence   . Hx: UTI (urinary tract infection)   . HYPOTHYROIDISM 12/20/2009    Current Outpatient Prescriptions on File Prior to Visit  Medication Sig Dispense Refill  . albuterol (PROVENTIL) (2.5 MG/3ML) 0.083% nebulizer solution Take 3 mLs (2.5 mg total) by nebulization every 6 (six) hours as needed.  75 mL  5  . albuterol (VENTOLIN HFA) 108 (90 BASE) MCG/ACT inhaler Inhale 2 puffs into the lungs every 6 (six) hours as needed.  1 Inhaler  5  . budesonide-formoterol (SYMBICORT) 160-4.5 MCG/ACT inhaler Inhale 2 puffs into the lungs 2 (two) times daily.  1 Inhaler  6  . Calcium Carbonate (CALCIUM 500 PO) Take 1 tablet by mouth 2 (two) times daily.        . colchicine 0.6 MG tablet 2 tabs by mouth now followed by 1 tablet one hour later. May repeat tomorrow if needed.  6 tablet  0  . diphenhydrAMINE (BENADRYL) 25 MG tablet Take 25 mg by mouth at bedtime as needed.      Marland Kitchen EPINEPHrine (EPI-PEN) 0.3 mg/0.3 mL DEVI Inject 0.3 mLs (0.3 mg total) into the muscle  once. For severe allergic reaction.  1 Device  2  . Ergocalciferol (VITAMIN D2) 400 UNITS TABS Take 1 tablet by mouth daily.      Marland Kitchen estradiol (VIVELLE-DOT) 0.05 MG/24HR Place 1 patch onto the skin. Every 4 days.       Marland Kitchen levothyroxine (SYNTHROID, LEVOTHROID) 175 MCG tablet Take 2 tablets Mon, Wed, Fri and 1 tablet all other days.      Marland Kitchen loratadine-pseudoephedrine (CLARITIN-D 12 HOUR) 5-120 MG per tablet Take 1 tablet by mouth 2 (two) times daily.  60 tablet  2  . meloxicam (MOBIC) 7.5 MG tablet Take 1 tablet (7.5 mg total) by mouth daily.  90 tablet  1  . moexipril (UNIVASC) 15 MG tablet Take 1 tablet (15 mg total) by mouth daily.  90 tablet  1  . Multiple Vitamin (MULTIVITAMIN) tablet Take 1 tablet by mouth daily.        . Omega-3 Fatty Acids (FISH OIL) 1200 MG CAPS Take 1 capsule by mouth daily.        Marland Kitchen omeprazole (PRILOSEC) 20 MG capsule Take 20 mg by mouth daily.       . simvastatin (ZOCOR) 40 MG tablet Take 1 tablet (40 mg total) by mouth at bedtime.  90 tablet  1  . ZOLMitriptan (ZOMIG) 2.5 MG tablet Take 1 tablet (2.5 mg total) by mouth as needed for migraine.  10 tablet  1    Past Surgical History  Procedure Date  . Cholecystectomy 1990  . Breast surgery 2011    left breast biposy  . Appendectomy 2004  . Tonsillectomy and adenoidectomy 2000  . Abdominal hysterectomy 2004    also had bladder sling  . Thyroidectomy 2000  . Colonoscopy w/ polypectomy 06/2009    Allergies  Allergen Reactions  . Aspirin     REACTION: stomach sensitivity  . Codeine     REACTION: rash, dyspnea  . Erythromycin     REACTION: rash, dyspnea  . Iodine     REACTION: heart stopped  . Latex     REACTION: Swelling  . Lidocaine     REACTION: throat swelling  . Penicillins     REACTION: rash  . Pentazocine Lactate     REACTION: hives, difficulty swallowing  . Sulfonamide Derivatives     REACTION: rash    History   Social History  . Marital Status: Married    Spouse Name: N/A    Number of  Children: N/A  . 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 Smoking status: Never Smoker   . Smokeless tobacco: Not on file  . Alcohol Use: No  . Drug Use: No  . Sexually Active: Not on file   Other Topics Concern  . Not on file   Social History Narrative   Regular exercise: yes    Family History  Problem Relation Age of Onset  . Alcohol abuse Mother   . Arthritis Mother   . Cancer Mother     ovarian, breast  . Hypertension Mother   . Bipolar disorder Mother   . Alcohol abuse Father   . Cancer Father     lung  . Hyperlipidemia Father   . Kidney disease Father   . Diabetes Father   . Arthritis Maternal Grandmother   . Diabetes Paternal Grandmother     BP 142/95  Pulse 82  Temp 98 F (36.7 C) (Oral)  Ht 5\' 8"  (1.727 m)  Wt 246 lb (111.585 kg)  BMI 37.40 kg/m2  Review of Systems See HPI above.    Objective:   Physical Exam Gen: NAD  L knee: No effusion, anterior bruising present over patellar tendon.  No other deformity. TTP over patellar tendon, lateral joint line.   ROM 0 - 90 degrees but painful. Negative ant/post drawers. Negative valgus/varus testing. Negative lachmanns. Negative mcmurrays, apleys, patellar apprehension, clarkes. NV intact distally.  R knee: FROM without pain or instability.  MSK u/s: patellar tendon intact without partial tears or increased neovascularity.  No effusion.    Assessment & Plan:  1. Left knee injury - X-rays negative for fracture.  MSK shows no patellar tendon rupture or partial tears.  Reassured patient - believe she strained patellar tendon and bruised lateral aspect of knee.  Should improve over next 2-4 weeks.  Icing, ibuprofen, tramadol at bedtime.  F/u in 4 weeks if continues to have issues.

## 2011-11-02 NOTE — Patient Instructions (Addendum)
You have bruised your knee and strained your patellar tendon. Your x-rays are negative for a fracture and you did not tear the tendon based on your ultrasound. This should resolve over the next 2-4 weeks. Ice knee 15 minutes at a time as needed. Ibuprofen 3 tabs three times a day with food as needed for pain. Tramadol at bedtime as needed for severe pain (no driving on this medicine). Crutches only if needed. If still struggling with this after 4-6 weeks, come back to see me.

## 2011-11-04 NOTE — Assessment & Plan Note (Signed)
X-rays negative for fracture.  MSK shows no patellar tendon rupture or partial tears.  Reassured patient - believe she strained patellar tendon and bruised lateral aspect of knee.  Should improve over next 2-4 weeks.  Icing, ibuprofen, tramadol at bedtime.  F/u in 4 weeks if continues to have issues.

## 2011-11-20 ENCOUNTER — Encounter: Payer: Self-pay | Admitting: Family

## 2011-11-20 ENCOUNTER — Ambulatory Visit (INDEPENDENT_AMBULATORY_CARE_PROVIDER_SITE_OTHER): Payer: BC Managed Care – PPO | Admitting: Family

## 2011-11-20 VITALS — BP 118/72 | HR 65 | Temp 97.5°F | Resp 16 | Wt 256.1 lb

## 2011-11-20 DIAGNOSIS — T148 Other injury of unspecified body region: Secondary | ICD-10-CM

## 2011-11-20 DIAGNOSIS — T148XXA Other injury of unspecified body region, initial encounter: Secondary | ICD-10-CM

## 2011-11-20 DIAGNOSIS — R509 Fever, unspecified: Secondary | ICD-10-CM

## 2011-11-20 DIAGNOSIS — W57XXXA Bitten or stung by nonvenomous insect and other nonvenomous arthropods, initial encounter: Secondary | ICD-10-CM

## 2011-11-20 NOTE — Patient Instructions (Addendum)
Call if you develop rash, joint pain, fever >101. Complete your lab work prior to leaving.

## 2011-11-20 NOTE — Progress Notes (Signed)
Subjective:    Patient ID: Michelle Kennedy, female    DOB: 1956/01/12, 56 y.o.   MRN: [id number]  HPI  Michelle Kennedy is a 56 yr old female who presents today with chief complaint of tick bite.  She reports that the tick bite occurred on Tuesday AM 6/25. She went camping over the weekend. Bug bit her on the left lower abdomen.  She removed tick and cleansed with soap and water.   She took ibuprofen this AM due to headache. She reports that she had mild rash on chest which she attributes to sun burn.  She denies joint pain.  Denies sinus congestion.  She has had mild diarrhea x 2 days.  She reports Tmax 100.3 yesterday and today.  Review of Systems See HPI  Past Medical History  Diagnosis Date  . Arthritis   . Asthma   . GERD (gastroesophageal reflux disease)   . Depression   . Allergy   . Hypertension   . Hyperlipidemia   . Diverticulitis 06/2009  . Migraine   . History of esophageal ulcer   . Colon polyps   . Cancer 2000    thyroid  . Urinary incontinence   . Hx: UTI (urinary tract infection)   . HYPOTHYROIDISM 12/20/2009    History   Social History  . Marital Status: Married    Spouse Name: N/A    Number of Children: N/A  . 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 Smoking status: Never Smoker   . Smokeless tobacco: Not on file  . Alcohol Use: No  . Drug Use: No  . Sexually Active: Not on file   Other Topics Concern  . Not on file   Social History Narrative   Regular exercise: yes    Past Surgical History  Procedure Date  . Cholecystectomy 1990  . Breast surgery 2011    left breast biposy  . Appendectomy 2004  . Tonsillectomy and adenoidectomy 2000  . Abdominal hysterectomy 2004    also had bladder sling  . Thyroidectomy 2000  . Colonoscopy w/ polypectomy 06/2009    Family History  Problem Relation Age of Onset  . Alcohol abuse Mother   . Arthritis Mother   . Cancer Mother     ovarian, breast  .  Hypertension Mother   . Bipolar disorder Mother   . Alcohol abuse Father   . Cancer Father     lung  . Hyperlipidemia Father   . Kidney disease Father   . Diabetes Father   . Arthritis Maternal Grandmother   . Diabetes Paternal Grandmother     Allergies  Allergen Reactions  . Aspirin     REACTION: stomach sensitivity  . Codeine     REACTION: rash, dyspnea  . Erythromycin     REACTION: rash, dyspnea  . Iodine     REACTION: heart stopped  . Latex     REACTION: Swelling  . Lidocaine     REACTION: throat swelling  . Penicillins     REACTION: rash  . Pentazocine Lactate     REACTION: hives, difficulty swallowing  . Sulfonamide Derivatives     REACTION: rash    Current Outpatient Prescriptions on File Prior to Visit  Medication Sig Dispense Refill  . albuterol (PROVENTIL) (2.5 MG/3ML) 0.083% nebulizer solution Take 3 mLs (2.5 mg total) by nebulization every 6 (six) hours as needed.  75 mL  5  .  albuterol (VENTOLIN HFA) 108 (90 BASE) MCG/ACT inhaler Inhale 2 puffs into the lungs every 6 (six) hours as needed.  1 Inhaler  5  . budesonide-formoterol (SYMBICORT) 160-4.5 MCG/ACT inhaler Inhale 2 puffs into the lungs 2 (two) times daily.  1 Inhaler  6  . Calcium Carbonate (CALCIUM 500 PO) Take 1 tablet by mouth 2 (two) times daily.        . colchicine 0.6 MG tablet 2 tabs by mouth now followed by 1 tablet one hour later. May repeat tomorrow if needed.  6 tablet  0  . diphenhydrAMINE (BENADRYL) 25 MG tablet Take 25 mg by mouth at bedtime as needed.      Marland Kitchen EPINEPHrine (EPI-PEN) 0.3 mg/0.3 mL DEVI Inject 0.3 mLs (0.3 mg total) into the muscle once. For severe allergic reaction.  1 Device  2  . Ergocalciferol (VITAMIN D2) 400 UNITS TABS Take 1 tablet by mouth daily.      Marland Kitchen estradiol (VIVELLE-DOT) 0.05 MG/24HR Place 1 patch onto the skin. Every 4 days.       Marland Kitchen levothyroxine (SYNTHROID, LEVOTHROID) 175 MCG tablet Take 2 tablets Mon, Wed, Fri and 1 tablet all other days.      Marland Kitchen  loratadine-pseudoephedrine (CLARITIN-D 12 HOUR) 5-120 MG per tablet Take 1 tablet by mouth 2 (two) times daily.  60 tablet  2  . meloxicam (MOBIC) 7.5 MG tablet Take 1 tablet (7.5 mg total) by mouth daily.  90 tablet  1  . moexipril (UNIVASC) 15 MG tablet Take 1 tablet (15 mg total) by mouth daily.  90 tablet  1  . Multiple Vitamin (MULTIVITAMIN) tablet Take 1 tablet by mouth daily.        . Omega-3 Fatty Acids (FISH OIL) 1200 MG CAPS Take 1 capsule by mouth daily.        Marland Kitchen omeprazole (PRILOSEC) 20 MG capsule Take 20 mg by mouth daily.       . simvastatin (ZOCOR) 40 MG tablet Take 1 tablet (40 mg total) by mouth at bedtime.  90 tablet  1  . ZOLMitriptan (ZOMIG) 2.5 MG tablet Take 1 tablet (2.5 mg total) by mouth as needed for migraine.  10 tablet  1    BP 118/72  Pulse 65  Temp 97.5 F (36.4 C) (Oral)  Resp 16  Wt 256 lb 1.3 oz (116.157 kg)  SpO2 96%       Objective:   Physical Exam  Constitutional: She appears well-developed and well-nourished. No distress.  Cardiovascular: Normal rate and regular rhythm.   Pulmonary/Chest: Effort normal and breath sounds normal. No respiratory distress. She has no wheezes. She has no rales. She exhibits no tenderness.  Skin: No rash noted.       Left abdomen- healing tick bite without erythema.           Assessment & Plan:

## 2011-11-20 NOTE — Assessment & Plan Note (Signed)
Will check lyme titer.  Clinically doubt lyme disease.  I suspect that her low grade temp is related to her diarrhea.  Pt is to call if symptoms worsen, or if they do not improve.

## 2011-11-29 ENCOUNTER — Ambulatory Visit: Payer: BC Managed Care – PPO | Admitting: Family

## 2011-12-04 ENCOUNTER — Encounter: Payer: Self-pay | Admitting: Family

## 2011-12-25 ENCOUNTER — Other Ambulatory Visit: Payer: Self-pay | Admitting: Family

## 2012-01-12 ENCOUNTER — Other Ambulatory Visit: Payer: Self-pay | Admitting: *Deleted

## 2012-01-12 MED ORDER — ALBUTEROL SULFATE HFA 108 (90 BASE) MCG/ACT IN AERS: 2.0000 | INHALATION_SPRAY | Freq: Four times a day (QID) | RESPIRATORY_TRACT | Status: DC | PRN

## 2012-01-12 NOTE — Telephone Encounter (Signed)
Pt called left msg on vm needing new rx on her albuterol inhaler. Called pt back to confirm which pharmacy. Sending to [facility]... 01/12/12@10 :10am/LMB

## 2012-01-23 ENCOUNTER — Other Ambulatory Visit: Payer: Self-pay | Admitting: Family

## 2012-01-23 NOTE — Telephone Encounter (Signed)
OK to send #90 x 1 refill.

## 2012-01-23 NOTE — Telephone Encounter (Signed)
Request for Mobic mail order [facility] 02.12.13 #90x1]/SLS Please advise.

## 2012-01-23 NOTE — Telephone Encounter (Signed)
Rx done/SLS 

## 2012-02-08 ENCOUNTER — Other Ambulatory Visit: Payer: Self-pay | Admitting: Family

## 2012-02-08 NOTE — Telephone Encounter (Signed)
Meloxicam request [Last Rx 09.03.13 #90x1]--DENIED--too soon to request refill/SLS

## 2012-02-09 ENCOUNTER — Encounter: Payer: Self-pay | Admitting: Family Medicine

## 2012-02-09 ENCOUNTER — Ambulatory Visit (INDEPENDENT_AMBULATORY_CARE_PROVIDER_SITE_OTHER): Payer: BC Managed Care – PPO | Admitting: Family Medicine

## 2012-02-09 ENCOUNTER — Ambulatory Visit (HOSPITAL_BASED_OUTPATIENT_CLINIC_OR_DEPARTMENT_OTHER)
Admission: RE | Admit: 2012-02-09 | Discharge: 2012-02-09 | Disposition: A | Discharge status: Home / Self Care | Payer: BC Managed Care – PPO | Source: Ambulatory Visit | Attending: Family Medicine | Admitting: Family Medicine

## 2012-02-09 VITALS — BP 146/87 | HR 62 | Ht 68.0 in | Wt 250.0 lb

## 2012-02-09 DIAGNOSIS — S99929A Unspecified injury of unspecified foot, initial encounter: Secondary | ICD-10-CM | POA: Insufficient documentation

## 2012-02-09 DIAGNOSIS — M25569 Pain in unspecified knee: Secondary | ICD-10-CM | POA: Insufficient documentation

## 2012-02-09 DIAGNOSIS — S8990XA Unspecified injury of unspecified lower leg, initial encounter: Secondary | ICD-10-CM

## 2012-02-09 DIAGNOSIS — S8992XA Unspecified injury of left lower leg, initial encounter: Secondary | ICD-10-CM

## 2012-02-09 DIAGNOSIS — M25469 Effusion, unspecified knee: Secondary | ICD-10-CM | POA: Insufficient documentation

## 2012-02-09 DIAGNOSIS — X58XXXA Exposure to other specified factors, initial encounter: Secondary | ICD-10-CM | POA: Insufficient documentation

## 2012-02-10 ENCOUNTER — Encounter: Payer: Self-pay | Admitting: Family Medicine

## 2012-02-10 NOTE — Assessment & Plan Note (Signed)
X-rays negative for fracture.  2/2 contusion.  Ligaments and meniscal testing negative.  Reassured patient.  Should improve over next 2-4 weeks.  Icing, ibuprofen, tramadol at bedtime as needed.  F/u prn.

## 2012-02-10 NOTE — Progress Notes (Signed)
Subjective:    Patient ID: Michelle Kennedy, female    DOB: 1956-05-11, 56 y.o.   MRN: [id number]  PCP: Sandford Craze  Knee Pain    56 yo F here for new left knee injury.  6/13: Patient reports on 6/11 she was walking and a cat was underneath her - fell to her left side leading to fairly severe anterior left knee pain - thinks she also landed on outside of left knee. Has had trouble ambulating though she has improved since then. Has been icing, taking ibuprofen, staying off this leg as much as possible. + swelling and anterior bruising. This is same knee she had patellar tendon repair on. No catching or locking but hasn't been bending knee a lot.  9/20: Patient states she improved after last visit. On 9/17 she hit her lateral knee in the morning and fell down onto her left knee. Dificulty walking regularly since then - now with a limp. Has been icing, taking ibuprofen and prior pain medications. Has pain across anterior knee. Feels like knee could go out on her.  No catching or locking. Has been using knee brace.  Past Medical History  Diagnosis Date  . Arthritis   . Asthma   . GERD (gastroesophageal reflux disease)   . Depression   . Allergy   . Hypertension   . Hyperlipidemia   . Diverticulitis 06/2009  . Migraine   . History of esophageal ulcer   . Colon polyps   . Cancer 2000    thyroid  . Urinary incontinence   . Hx: UTI (urinary tract infection)   . HYPOTHYROIDISM 12/20/2009    Current Outpatient Prescriptions on File Prior to Visit  Medication Sig Dispense Refill  . albuterol (PROVENTIL) (2.5 MG/3ML) 0.083% nebulizer solution Take 3 mLs (2.5 mg total) by nebulization every 6 (six) hours as needed.  75 mL  5  . albuterol (VENTOLIN HFA) 108 (90 BASE) MCG/ACT inhaler Inhale 2 puffs into the lungs every 6 (six) hours as needed.  1 Inhaler  5  . budesonide-formoterol (SYMBICORT) 160-4.5 MCG/ACT inhaler Inhale 2 puffs into the lungs 2 (two) times daily.  1 Inhaler   6  . Calcium Carbonate (CALCIUM 500 PO) Take 1 tablet by mouth 2 (two) times daily.        . colchicine 0.6 MG tablet 2 tabs by mouth now followed by 1 tablet one hour later. May repeat tomorrow if needed.  6 tablet  0  . diphenhydrAMINE (BENADRYL) 25 MG tablet Take 25 mg by mouth at bedtime as needed.      Marland Kitchen EPINEPHrine (EPI-PEN) 0.3 mg/0.3 mL DEVI Inject 0.3 mLs (0.3 mg total) into the muscle once. For severe allergic reaction.  1 Device  2  . Ergocalciferol (VITAMIN D2) 400 UNITS TABS Take 1 tablet by mouth daily.      Marland Kitchen estradiol (VIVELLE-DOT) 0.05 MG/24HR Place 1 patch onto the skin. Every 4 days.       Marland Kitchen levothyroxine (SYNTHROID, LEVOTHROID) 175 MCG tablet Take 2 tablets Mon, Wed, Fri and 1 tablet all other days.      Marland Kitchen loratadine-pseudoephedrine (CLARITIN-D 12 HOUR) 5-120 MG per tablet Take 1 tablet by mouth 2 (two) times daily.  60 tablet  2  . meloxicam (MOBIC) 7.5 MG tablet TAKE 1 TABLET ( 7.5 MG TOTAL ) BY MOUTH DAILY  90 tablet  1  . moexipril (UNIVASC) 15 MG tablet TAKE 1 TABLET ( 15 MG TOTAL ) BY MOUTH DAILY  90 tablet  1  . Multiple Vitamin (MULTIVITAMIN) tablet Take 1 tablet by mouth daily.        . Omega-3 Fatty Acids (FISH OIL) 1200 MG CAPS Take 1 capsule by mouth daily.        Marland Kitchen omeprazole (PRILOSEC) 20 MG capsule Take 20 mg by mouth daily.       Marland Kitchen PREMARIN 0.625 MG tablet       . simvastatin (ZOCOR) 40 MG tablet Take 1 tablet (40 mg total) by mouth at bedtime.  90 tablet  1  . simvastatin (ZOCOR) 40 MG tablet TAKE 1 TABLET (40MG ) BY MOUTH DAILY  90 tablet  1  . ZOLMitriptan (ZOMIG) 2.5 MG tablet Take 1 tablet (2.5 mg total) by mouth as needed for migraine.  10 tablet  1    Past Surgical History  Procedure Date  . Cholecystectomy 1990  . Breast surgery 2011    left breast biposy  . Appendectomy 2004  . Tonsillectomy and adenoidectomy 2000  . Abdominal hysterectomy 2004    also had bladder sling  . Thyroidectomy 2000  . Colonoscopy w/ polypectomy 06/2009     Allergies  Allergen Reactions  . Aspirin     REACTION: stomach sensitivity  . Codeine     REACTION: rash, dyspnea  . Erythromycin     REACTION: rash, dyspnea  . Iodine     REACTION: heart stopped  . Latex     REACTION: Swelling  . Lidocaine     REACTION: throat swelling  . Penicillins     REACTION: rash  . Pentazocine Lactate     REACTION: hives, difficulty swallowing  . Sulfonamide Derivatives     REACTION: rash    History   Social History  . Marital Status: Married    Spouse Name: Michelle Kennedy    Number of Children: Michelle Kennedy  . Years of Education: Michelle Kennedy   Occupational History  . 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 Smoking status: Never Smoker   . Smokeless tobacco: Not on file  . Alcohol Use: No  . Drug Use: No  . Sexually Active: Not on file   Other Topics Concern  . Not on file   Social History Narrative   Regular exercise: yes    Family History  Problem Relation Age of Onset  . Alcohol abuse Mother   . Arthritis Mother   . Cancer Mother     ovarian, breast  . Hypertension Mother   . Bipolar disorder Mother   . Alcohol abuse Father   . Cancer Father     lung  . Hyperlipidemia Father   . Kidney disease Father   . Diabetes Father   . Arthritis Maternal Grandmother   . Diabetes Paternal Grandmother     BP 146/87  Pulse 62  Ht 5\' 8"  (1.727 m)  Wt 250 lb (113.399 kg)  BMI 38.01 kg/m2  Review of Systems  See HPI above.    Objective:   Physical Exam  Gen: NAD  L knee: No effusion, bruising.  No other deformity. Mild TTP medial and lateral joint lines.  No post patellar facet or other TTP. ROM 0 - 120 degrees. Negative ant/post drawers. Negative valgus/varus testing. Negative lachmanns. Negative mcmurrays, apleys, patellar apprehension, clarkes. NV intact distally.  R knee: FROM without pain or instability.     Assessment & Plan:  1. Left knee injury - X-rays negative for fracture.  2/2 contusion.  Ligaments and meniscal testing  negative.  Reassured patient.  Should improve over next 2-4 weeks.  Icing, ibuprofen, tramadol at bedtime as needed.  F/u prn.

## 2012-03-06 ENCOUNTER — Telehealth: Payer: Self-pay | Admitting: *Deleted

## 2012-03-06 ENCOUNTER — Ambulatory Visit (INDEPENDENT_AMBULATORY_CARE_PROVIDER_SITE_OTHER): Payer: BC Managed Care – PPO | Admitting: Family

## 2012-03-06 DIAGNOSIS — Z23 Encounter for immunization: Secondary | ICD-10-CM

## 2012-03-06 NOTE — Telephone Encounter (Signed)
Pt presented to the office for a flu shot. Pt has documented latex allergy that causes rash, swelling and itching of the throat. Advised pt we would not be able to administer the vaccine and she reports that she has received the flu vaccine every year since 1979. 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 Reports that she takes Benadryl prior to the injection and will take another Benadryl later on and has never had a problem after taking these measures. Pt states that she took Benadryl before coming to the office. Received authorization from Provider to proceed with injection. Pt waited in the office for 30 minutes and only experienced a red circle around injection site and itching. Pt did not develop any other symptoms. Provider notified and pt was released from the office.

## 2012-03-07 ENCOUNTER — Ambulatory Visit: Payer: BC Managed Care – PPO

## 2012-06-24 ENCOUNTER — Ambulatory Visit (INDEPENDENT_AMBULATORY_CARE_PROVIDER_SITE_OTHER): Payer: BC Managed Care – PPO | Admitting: Family

## 2012-06-24 ENCOUNTER — Encounter: Payer: Self-pay | Admitting: Family

## 2012-06-24 VITALS — BP 136/86 | HR 67 | Temp 98.1°F | Resp 16

## 2012-06-24 DIAGNOSIS — J329 Chronic sinusitis, unspecified: Secondary | ICD-10-CM

## 2012-06-24 DIAGNOSIS — R05 Cough: Secondary | ICD-10-CM

## 2012-06-24 MED ORDER — CEFUROXIME AXETIL 500 MG PO TABS: 500.0000 mg | ORAL_TABLET | Freq: Two times a day (BID) | ORAL | Status: DC

## 2012-06-24 NOTE — Assessment & Plan Note (Signed)
Will rx with ceftin. Pt reports tolerating cephalosporins in the past.

## 2012-06-24 NOTE — Patient Instructions (Addendum)
Call if symptoms worsen or if no improvement in 2-3 days.  

## 2012-06-24 NOTE — Progress Notes (Signed)
Subjective:    Patient ID: Michelle Kennedy, female    DOB: 11/27/1955, 57 y.o.   MRN: [id number]  HPI  [name] is a 57 yr old female who presents today with multiple complaints.  Pt reports cough, nasal congestion, body aches, subjective fever and upper right side abdominal pain at lower portion of her right ribs.  Husband has similar symptoms but his started a few days after hers.  She reports that she has tried vick vapo rub, dayquil/nyquil which did not help.   She reports + sinus pressure, + sinus drainage (yellow in color).    Review of Systems See HPI  Past Medical History  Diagnosis Date  . Arthritis   . Asthma   . GERD (gastroesophageal reflux disease)   . Depression   . Allergy   . Hypertension   . Hyperlipidemia   . Diverticulitis 06/2009  . Migraine   . History of esophageal ulcer   . Colon polyps   . Cancer 2000    thyroid  . Urinary incontinence   . Hx: UTI (urinary tract infection)   . HYPOTHYROIDISM 12/20/2009    History   Social History  . Marital Status: Married    Spouse Name: N/A    Number of Children: N/A  . 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 Smoking status: Never Smoker   . Smokeless tobacco: Not on file  . Alcohol Use: No  . Drug Use: No  . Sexually Active: Not on file   Other Topics Concern  . Not on file   Social History Narrative   Regular exercise: yes    Past Surgical History  Procedure Date  . Cholecystectomy 1990  . Breast surgery 2011    left breast biposy  . Appendectomy 2004  . Tonsillectomy and adenoidectomy 2000  . Abdominal hysterectomy 2004    also had bladder sling  . Thyroidectomy 2000  . Colonoscopy w/ polypectomy 06/2009    Family History  Problem Relation Age of Onset  . Alcohol abuse Mother   . Arthritis Mother   . Cancer Mother     ovarian, breast  . Hypertension Mother   . Bipolar disorder Mother   . Alcohol abuse Father   . Cancer Father     lung  . Hyperlipidemia Father   . Kidney disease Father   . Diabetes Father   . Arthritis Maternal Grandmother   . Diabetes Paternal Grandmother     Allergies  Allergen Reactions  . Aspirin     REACTION: stomach sensitivity  . Codeine     REACTION: rash, dyspnea  . Erythromycin     REACTION: rash, dyspnea  . Iodine     REACTION: heart stopped  . Latex     REACTION: Swelling  . Lidocaine     REACTION: throat swelling  . Penicillins     REACTION: rash  . Pentazocine Lactate     REACTION: hives, difficulty swallowing  . Sulfonamide Derivatives     REACTION: rash    Current Outpatient Prescriptions on File Prior to Visit  Medication Sig Dispense Refill  . albuterol (PROVENTIL) (2.5 MG/3ML) 0.083% nebulizer solution Take 3 mLs (2.5 mg total) by nebulization every 6 (six) hours as needed.  75 mL  5  . albuterol (VENTOLIN HFA) 108 (90 BASE) MCG/ACT inhaler Inhale 2 puffs into the lungs every 6 (six) hours as needed.  1 Inhaler  5  . budesonide-formoterol (SYMBICORT) 160-4.5 MCG/ACT inhaler Inhale 2 puffs into the lungs 2 (two) times daily.  1 Inhaler  6  . Calcium Carbonate (CALCIUM 500 PO) Take 1 tablet by mouth 2 (two) times daily.        . colchicine 0.6 MG tablet 2 tabs by mouth now followed by 1 tablet one hour later. May repeat tomorrow if needed.  6 tablet  0  . diphenhydrAMINE (BENADRYL) 25 MG tablet Take 25 mg by mouth at bedtime as needed.      Marland Kitchen EPINEPHrine (EPI-PEN) 0.3 mg/0.3 mL DEVI Inject 0.3 mLs (0.3 mg total) into the muscle once. For severe allergic reaction.  1 Device  2  . Ergocalciferol (VITAMIN D2) 400 UNITS TABS Take 1 tablet by mouth daily.      Marland Kitchen levothyroxine (SYNTHROID, LEVOTHROID) 175 MCG tablet Take 2 tablets Mon, Wed, Fri and 1 tablet all other days.      . meloxicam (MOBIC) 7.5 MG tablet TAKE 1 TABLET ( 7.5 MG TOTAL ) BY MOUTH DAILY  90 tablet  1  . moexipril (UNIVASC) 15 MG tablet TAKE 1 TABLET ( 15 MG TOTAL ) BY MOUTH DAILY  90 tablet  1  .  Multiple Vitamin (MULTIVITAMIN) tablet Take 1 tablet by mouth daily.        . Omega-3 Fatty Acids (FISH OIL) 1200 MG CAPS Take 1 capsule by mouth daily.        Marland Kitchen omeprazole (PRILOSEC) 20 MG capsule Take 20 mg by mouth daily.       Marland Kitchen PREMARIN 0.625 MG tablet       . simvastatin (ZOCOR) 40 MG tablet TAKE 1 TABLET (40MG ) BY MOUTH DAILY  90 tablet  1  . estradiol (VIVELLE-DOT) 0.05 MG/24HR Place 1 patch onto the skin. Every 4 days.         BP 136/86  Pulse 67  Temp 98.1 F (36.7 C) (Oral)  Resp 16  SpO2 97%       Objective:   Physical Exam  Constitutional: She is oriented to person, place, and time. She appears well-developed and well-nourished. No distress.  HENT:  Head: Normocephalic and atraumatic.  Right Ear: Tympanic membrane and ear canal normal.  Left Ear: Tympanic membrane and ear canal normal.  Mouth/Throat: No oropharyngeal exudate or posterior oropharyngeal edema.  Eyes: No scleral icterus.  Cardiovascular: Normal rate and regular rhythm.   No murmur heard. Pulmonary/Chest: Effort normal and breath sounds normal. No respiratory distress. She has no wheezes. She has no rales. She exhibits no tenderness.  Abdominal: Soft. Bowel sounds are normal.  Musculoskeletal: She exhibits no edema.  Lymphadenopathy:    She has no cervical adenopathy.  Neurological: She is alert and oriented to person, place, and time.  Skin: Skin is warm and dry.  Psychiatric: She has a normal mood and affect. Her behavior is normal. Judgment and thought content normal.          Assessment & Plan:

## 2012-07-17 ENCOUNTER — Ambulatory Visit (INDEPENDENT_AMBULATORY_CARE_PROVIDER_SITE_OTHER): Payer: BC Managed Care – PPO | Admitting: Internal Medicine

## 2012-07-17 ENCOUNTER — Encounter: Payer: Self-pay | Admitting: Internal Medicine

## 2012-07-17 VITALS — BP 128/82 | HR 63 | Temp 97.0°F | Ht 68.0 in | Wt 263.0 lb

## 2012-07-17 DIAGNOSIS — J45901 Unspecified asthma with (acute) exacerbation: Secondary | ICD-10-CM

## 2012-07-17 MED ORDER — PREDNISONE 10 MG PO TABS: ORAL_TABLET | ORAL | Status: DC

## 2012-07-17 MED ORDER — METHYLPREDNISOLONE ACETATE 80 MG/ML IJ SUSP
80.0000 mg | Freq: Once | INTRAMUSCULAR | Status: AC
  Administered 2012-07-17: 80 mg via INTRAMUSCULAR

## 2012-07-17 NOTE — Patient Instructions (Addendum)

## 2012-07-18 ENCOUNTER — Encounter: Payer: Self-pay | Admitting: Internal Medicine

## 2012-07-18 NOTE — Progress Notes (Signed)
HPI  Pt presents to the clinic today with c/o cough and worsening asthma symptoms over the last 3 days. She did have a cold about a week ago leading up to this. She has been using her inhalers and Nyquil with some relief but the coug hand wheezing seem to be getting worse. She is having more trouble at night when she tries to lay down. She does have a history of allergies in addition to asthma. She has had sick contacts.  Review of Systems      Past Medical History  Diagnosis Date  . Arthritis   . Asthma   . GERD (gastroesophageal reflux disease)   . Depression   . Allergy   . Hypertension   . Hyperlipidemia   . Diverticulitis 06/2009  . Migraine   . History of esophageal ulcer   . Colon polyps   . Cancer 2000    thyroid  . Urinary incontinence   . Hx: UTI (urinary tract infection)   . HYPOTHYROIDISM 12/20/2009    Family History  Problem Relation Age of Onset  . Alcohol abuse Mother   . Arthritis Mother   . Cancer Mother     ovarian, breast  . Hypertension Mother   . Bipolar disorder Mother   . Alcohol abuse Father   . Cancer Father     lung  . Hyperlipidemia Father   . Kidney disease Father   . Diabetes Father   . Arthritis Maternal Grandmother   . Diabetes Paternal Grandmother     History   Social History  . Marital Status: Married    Spouse Name: N/A    Number of Children: N/A  . 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 Smoking status: Never Smoker   . Smokeless tobacco: Not on file  . Alcohol Use: No  . Drug Use: No  . Sexually Active: Not on file   Other Topics Concern  . Not on file   Social History Narrative   Regular exercise: yes          Allergies  Allergen Reactions  . Aspirin     REACTION: stomach sensitivity  . Codeine     REACTION: rash, dyspnea  . Erythromycin     REACTION: rash, dyspnea  . Iodine     REACTION: heart stopped  . Latex     REACTION: Swelling  . Lidocaine      REACTION: throat swelling  . Penicillins     REACTION: rash  . Pentazocine Lactate     REACTION: hives, difficulty swallowing  . Sulfonamide Derivatives     REACTION: rash     Constitutional: Positive fatigue. Denies headache, fever or abrupt weight changes.  HEENT: Denies eye redness, eye pain, pressure behind the eyes, facial pain, nasal congestion, ear pain, ringing in the ears, wax buildup, runny nose or bloody nose. Respiratory: Positive cough. Denies difficulty breathing or shortness of breath.  Cardiovascular: Denies chest pain, chest tightness, palpitations or swelling in the hands or feet.   No other specific complaints in a complete review of systems (except as listed in HPI above).  Objective:   BP 128/82  Pulse 63  Temp(Src) 97 F (36.1 C) (Oral)  Ht 5\' 8"  (1.727 m)  Wt 263 lb (119.296 kg)  BMI 40 kg/m2  SpO2 97% Wt Readings from Last 3 Encounters:  07/17/12 263 lb (119.296 kg)  02/09/12 250 lb (113.399 kg)  11/20/11 256 lb 1.3 oz (116.157 kg)     General: Appears her stated age, well developed, well nourished in NAD. HEENT: Head: normal shape and size; Eyes: sclera white, no icterus, conjunctiva pink, PERRLA and EOMs intact; Ears: Tm's gray and intact, normal light reflex; Nose: mucosa pink and moist, septum midline; Throat/Mouth: + PND. Teeth present, mucosa erythematous and moist, no exudate noted, no lesions or ulcerations noted.  Neck: Mild cervical lymphadenopathy. Neck supple, trachea midline. No massses, lumps or thyromegaly present.  Cardiovascular: Normal rate and rhythm. S1,S2 noted.  No murmur, rubs or gallops noted. No JVD or BLE edema. No carotid bruits noted. Pulmonary/Chest: Normal effort and bilateral expiratory wheezing. No respiratory distress. No rales or ronchi noted.      Assessment & Plan:   Upper Respiratory Infection, new onset with additional workup required:  Use inhalers as needed 80 mg Depo IM today eRx for pred taper If not  better in a few days, may need to get chest xray to r/o infectious process  RTC as needed or if symptoms persist.

## 2012-07-24 ENCOUNTER — Ambulatory Visit (HOSPITAL_BASED_OUTPATIENT_CLINIC_OR_DEPARTMENT_OTHER)
Admission: RE | Admit: 2012-07-24 | Discharge: 2012-07-24 | Disposition: A | Discharge status: Home / Self Care | Payer: BC Managed Care – PPO | Source: Ambulatory Visit | Attending: Family | Admitting: Family

## 2012-07-24 ENCOUNTER — Ambulatory Visit (INDEPENDENT_AMBULATORY_CARE_PROVIDER_SITE_OTHER): Payer: BC Managed Care – PPO | Admitting: Family

## 2012-07-24 ENCOUNTER — Encounter: Payer: Self-pay | Admitting: Family

## 2012-07-24 VITALS — BP 130/90 | HR 76 | Temp 97.6°F | Resp 18 | Wt 260.1 lb

## 2012-07-24 DIAGNOSIS — R062 Wheezing: Secondary | ICD-10-CM | POA: Insufficient documentation

## 2012-07-24 DIAGNOSIS — R059 Cough, unspecified: Secondary | ICD-10-CM | POA: Insufficient documentation

## 2012-07-24 DIAGNOSIS — R05 Cough: Secondary | ICD-10-CM

## 2012-07-24 MED ORDER — OMEPRAZOLE 40 MG PO CPDR: 40.0000 mg | DELAYED_RELEASE_CAPSULE | Freq: Every day | ORAL | Status: DC

## 2012-07-24 MED ORDER — OLOPATADINE HCL 0.6 % NA SOLN: NASAL | Status: DC

## 2012-07-24 NOTE — Progress Notes (Signed)
Subjective:    Patient ID: Michelle Kennedy, female    DOB: 1955/10/29, 57 y.o.   MRN: [id number]  HPI  Pt was treated for sinusitis on 2/3. Seen again on 2/26 and treated for URI and asthma exacerbation with prednisone and inhalers.  She continues to have cough.  Sinus symptoms resolved.  No improvement in cough with prednisone.  She reports associated ear pain.     Review of Systems See HPI  Past Medical History  Diagnosis Date  . Arthritis   . Asthma   . GERD (gastroesophageal reflux disease)   . Depression   . Allergy   . Hypertension   . Hyperlipidemia   . Diverticulitis 06/2009  . Migraine   . History of esophageal ulcer   . Colon polyps   . Cancer 2000    thyroid  . Urinary incontinence   . Hx: UTI (urinary tract infection)   . HYPOTHYROIDISM 12/20/2009    History   Social History  . Marital Status: Married    Spouse Name: N/A    Number of Children: N/A  . 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 Smoking status: Never Smoker   . Smokeless tobacco: Not on file  . Alcohol Use: No  . Drug Use: No  . Sexually Active: Not on file   Other Topics Concern  . Not on file   Social History Narrative   Regular exercise: yes          Past Surgical History  Procedure Laterality Date  . Cholecystectomy  1990  . Breast surgery  2011    left breast biposy  . Appendectomy  2004  . Tonsillectomy and adenoidectomy  2000  . Abdominal hysterectomy  2004    also had bladder sling  . Thyroidectomy  2000  . Colonoscopy w/ polypectomy  06/2009    Family History  Problem Relation Age of Onset  . Alcohol abuse Mother   . Arthritis Mother   . Cancer Mother     ovarian, breast  . Hypertension Mother   . Bipolar disorder Mother   . Alcohol abuse Father   . Cancer Father     lung  . Hyperlipidemia Father   . Kidney disease Father   . Diabetes Father   . Arthritis Maternal Grandmother   . Diabetes Paternal  Grandmother     Allergies  Allergen Reactions  . Aspirin     REACTION: stomach sensitivity  . Codeine     REACTION: rash, dyspnea  . Erythromycin     REACTION: rash, dyspnea  . Iodine     REACTION: heart stopped  . Latex     REACTION: Swelling  . Lidocaine     REACTION: throat swelling  . Penicillins     REACTION: rash  . Pentazocine Lactate     REACTION: hives, difficulty swallowing  . Sulfonamide Derivatives     REACTION: rash    Current Outpatient Prescriptions on File Prior to Visit  Medication Sig Dispense Refill  . albuterol (PROVENTIL) (2.5 MG/3ML) 0.083% nebulizer solution Take 3 mLs (2.5 mg total) by nebulization every 6 (six) hours as needed.  75 mL  5  . albuterol (VENTOLIN HFA) 108 (90 BASE) MCG/ACT inhaler Inhale 2 puffs into the lungs every 6 (six) hours as needed.  1 Inhaler  5  . budesonide-formoterol (SYMBICORT) 160-4.5 MCG/ACT inhaler Inhale 2 puffs into the lungs 2 (two) times daily.  1 Inhaler  6  . Calcium Carbonate (CALCIUM 500 PO) Take 1 tablet by mouth 2 (two) times daily.        . colchicine 0.6 MG tablet 2 tabs by mouth now followed by 1 tablet one hour later. May repeat tomorrow if needed.  6 tablet  0  . diphenhydrAMINE (BENADRYL) 25 MG tablet Take 25 mg by mouth at bedtime as needed.      Marland Kitchen EPINEPHrine (EPI-PEN) 0.3 mg/0.3 mL DEVI Inject 0.3 mLs (0.3 mg total) into the muscle once. For severe allergic reaction.  1 Device  2  . Ergocalciferol (VITAMIN D2) 400 UNITS TABS Take 1 tablet by mouth daily.      Marland Kitchen levothyroxine (SYNTHROID, LEVOTHROID) 175 MCG tablet Take 2 tablets Mon, Wed, Fri and 1 tablet all other days.      . meloxicam (MOBIC) 7.5 MG tablet TAKE 1 TABLET ( 7.5 MG TOTAL ) BY MOUTH DAILY  90 tablet  1  . moexipril (UNIVASC) 15 MG tablet TAKE 1 TABLET ( 15 MG TOTAL ) BY MOUTH DAILY  90 tablet  1  . Multiple Vitamin (MULTIVITAMIN) tablet Take 1 tablet by mouth daily.        . Omega-3 Fatty Acids (FISH OIL) 1200 MG CAPS Take 1 capsule by  mouth daily.        Marland Kitchen omeprazole (PRILOSEC) 20 MG capsule Take 20 mg by mouth daily.       . predniSONE (DELTASONE) 10 MG tablet Take 3 tablets on days 1-3, take 2 tablets on days 4-6, take 1 tablet on days 7-9  18 tablet  0  . PREMARIN 0.625 MG tablet       . simvastatin (ZOCOR) 40 MG tablet TAKE 1 TABLET (40MG ) BY MOUTH DAILY  90 tablet  1  . estradiol (VIVELLE-DOT) 0.05 MG/24HR Place 1 patch onto the skin. Every 4 days.        No current facility-administered medications on file prior to visit.    BP 130/90  Pulse 76  Temp(Src) 97.6 F (36.4 C) (Oral)  Resp 18  Wt 260 lb 1.9 oz (117.99 kg)  BMI 39.56 kg/m2  SpO2 97%       Objective:   Physical Exam  Constitutional: She appears well-developed and well-nourished. No distress.  HENT:  Head: Normocephalic and atraumatic.  Right Ear: Tympanic membrane and ear canal normal.  Left Ear: Tympanic membrane and ear canal normal.  Mouth/Throat: No oropharyngeal exudate, posterior oropharyngeal edema or posterior oropharyngeal erythema.  Cardiovascular: Normal rate and regular rhythm.   No murmur heard. Pulmonary/Chest: Effort normal. No respiratory distress.  Coarse expiratory wheeze, seems more upper airway/pseudowheeze in nature.    Musculoskeletal: She exhibits no edema.          Assessment & Plan:

## 2012-07-24 NOTE — Patient Instructions (Addendum)
Please complete your chest x ray on the first floor.  Follow up in 1 month. Call sooner if you develop fever or if symptoms are not improving.

## 2012-07-24 NOTE — Assessment & Plan Note (Signed)
Obtain CXR to rule out pneumonia, however, I suspect that this is more upper airway/vocal cord dysfunction.  Will aim to decrease post nasal drip and GERD. She is intolerant to flonase due to epistaxis, will try patanase.  Add Zyrtec, increase prilosec from 20mg  to 40mg .  Continue symbicort and albuterol. Consider referral to pulmonary if not improving.

## 2012-07-25 ENCOUNTER — Telehealth: Payer: Self-pay | Admitting: Family

## 2012-07-25 MED ORDER — AZELASTINE HCL 0.15 % NA SOLN: NASAL | Status: DC

## 2012-07-25 NOTE — Telephone Encounter (Signed)
Spoke with Erskine Squibb at [facility].  She states they will not approve Patanase nasal spray for the patient as she has not had an adverse reaction to Azelastine and this is the preferred spray on her insurance prolicy.  Advised Efraim Kaufmann is trying to reduce post nasal drip to alleviate the cough and the patient has had an adverse reaction to flonase.

## 2012-07-25 NOTE — Telephone Encounter (Signed)
Rx sent to [facility] in error. Resent rx to [facility].

## 2012-07-25 NOTE — Telephone Encounter (Signed)
Will send rx for Azelastine instead.

## 2012-08-09 ENCOUNTER — Telehealth: Payer: Self-pay | Admitting: Family

## 2012-08-09 DIAGNOSIS — R05 Cough: Secondary | ICD-10-CM

## 2012-08-09 NOTE — Telephone Encounter (Signed)
Please advise 

## 2012-08-09 NOTE — Telephone Encounter (Signed)
Patient states that she is still coughing and has not heard from our office regarding the referral to pulmonary?

## 2012-08-19 ENCOUNTER — Other Ambulatory Visit: Payer: Self-pay | Admitting: *Deleted

## 2012-08-19 NOTE — Telephone Encounter (Signed)
Received fax from [facility] requesting refill on omeprazole 40mg  1 capsule daily. Rx form forwarded to Provider for signature.

## 2012-08-20 MED ORDER — OMEPRAZOLE 40 MG PO CPDR: 40.0000 mg | DELAYED_RELEASE_CAPSULE | Freq: Every day | ORAL | Status: DC

## 2012-08-20 NOTE — Telephone Encounter (Signed)
Form faxed to [facility] at [phone]. 

## 2012-08-27 ENCOUNTER — Ambulatory Visit (INDEPENDENT_AMBULATORY_CARE_PROVIDER_SITE_OTHER): Payer: BC Managed Care – PPO | Admitting: Pulmonary Disease

## 2012-08-27 ENCOUNTER — Encounter: Payer: Self-pay | Admitting: Pulmonary Disease

## 2012-08-27 VITALS — BP 124/84 | HR 68 | Temp 97.8°F | Ht 68.0 in | Wt 267.0 lb

## 2012-08-27 DIAGNOSIS — R05 Cough: Secondary | ICD-10-CM

## 2012-08-27 DIAGNOSIS — J45909 Unspecified asthma, uncomplicated: Secondary | ICD-10-CM

## 2012-08-27 MED ORDER — CHLORPHENIRAMINE TANNATE 8 MG PO TABS: 8.0000 mg | ORAL_TABLET | Freq: Two times a day (BID) | ORAL | Status: DC

## 2012-08-27 MED ORDER — PSEUDOEPHEDRINE HCL 60 MG PO TABS: 60.0000 mg | ORAL_TABLET | Freq: Two times a day (BID) | ORAL | Status: DC

## 2012-08-27 NOTE — Patient Instructions (Signed)
Your cough appears to be post bronchitis - may be worsened by post nasal drip or moexepril STOP moexipril - keep BP record - if high, contact us for Rx for new medication Use advair 500/50 twice daily instead of symbicort - sample Stay on prilosec Use chlortrimeton instead of benadryl Use sudafed twice daily x 3 wks Take DELSYM 2 tsp thrice daily x 3 wks for cough suppression

## 2012-08-27 NOTE — Progress Notes (Signed)
Subjective:    Patient ID: Michelle Kennedy, female    DOB: 30-Dec-1955, 57 y.o.   MRN: [id number]  HPI 57 year old never smoker referred for evaluation of chronic cough since January 2014. 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  She developed URI symptoms and was treated for sinusitis on 2/3. She was again on 2/26  treated for URI and asthma exacerbation with prednisone and inhalers. A dry cough has persisted since then. There is no diurnal variation and this does wake her up at night on occasion.Sinus symptoms resolved. No improvement in cough with prednisone.  She is intolerant to flonase due to epistaxis, will try patanase. She was started on Zyrtec, increased prilosec from 20mg  to 40mg . She reports a rash on her back and tingling in her throat after taking Symbicort.  Of note she reports asthma since childhood, she was maintained on a regimen of Ventolin and Advair for many years before this was switched to Symbicort. She reports positive allergy testing to grass and Evergreen shrubs.  She reports a weight gain of 30 pounds. Medication review shows ACE inhibitor. She reports exposure to tuberculosis in 1980 from her mother but she has always tested PPD negative Chest x-ray did not show any infiltrates or effusions.   Past Medical History  Diagnosis Date  . Arthritis   . Asthma   . GERD (gastroesophageal reflux disease)   . Depression   . Allergy   . Hypertension   . Hyperlipidemia   . Diverticulitis 06/2009  . Migraine   . History of esophageal ulcer   . Colon polyps   . Cancer 2000    thyroid  . Urinary incontinence   . Hx: UTI (urinary tract infection)   . HYPOTHYROIDISM 12/20/2009   Past Surgical History  Procedure Laterality Date  . Cholecystectomy  1990  . Breast surgery  2011    left breast biposy  . Appendectomy  2004  . Tonsillectomy and adenoidectomy  2000  . Abdominal hysterectomy  2004     also had bladder sling  . Thyroidectomy  2000  . Colonoscopy w/ polypectomy  06/2009   Allergies  Allergen Reactions  . Aspirin     REACTION: stomach sensitivity  . Codeine     REACTION: rash, dyspnea  . Erythromycin     REACTION: rash, dyspnea  . Iodine     REACTION: heart stopped  . Latex     REACTION: Swelling  . Lidocaine     REACTION: throat swelling  . Penicillins     REACTION: rash  . Pentazocine Lactate     REACTION: hives, difficulty swallowing  . Sulfonamide Derivatives     REACTION: rash    History   Social History  . Marital Status: Married    Spouse Name: N/A    Number of Children: N/A  . 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 Smoking status: Never Smoker   . Smokeless tobacco: Not on file  . Alcohol Use: No  . Drug Use: No  . Sexually Active: Not on file   Other Topics Concern  . Not on file   Social History Narrative   Regular exercise: yes          Family History  Problem Relation Age of Onset  . Alcohol abuse Mother   . Arthritis Mother   .  Cancer Mother     ovarian, breast  . Hypertension Mother   . Bipolar disorder Mother   . Alcohol abuse Father   . Cancer Father     lung  . Hyperlipidemia Father   . Kidney disease Father   . Diabetes Father   . Arthritis Maternal Grandmother   . Diabetes Paternal Grandmother      Review of Systems  Constitutional: Positive for unexpected weight change. Negative for fever.  HENT: Positive for ear pain, congestion, sore throat and sneezing. Negative for nosebleeds, rhinorrhea, trouble swallowing, dental problem, postnasal drip and sinus pressure.   Eyes: Negative for redness and itching.  Respiratory: Positive for cough and shortness of breath. Negative for chest tightness and wheezing.   Cardiovascular: Positive for chest pain and palpitations. Negative for leg swelling.  Gastrointestinal: Negative for nausea and vomiting.   Genitourinary: Negative for dysuria.  Musculoskeletal: Negative for joint swelling.  Skin: Negative for rash.  Neurological: Positive for headaches.  Hematological: Does not bruise/bleed easily.  Psychiatric/Behavioral: Negative for dysphoric mood. The patient is not nervous/anxious.        Objective:   Physical Exam  Gen. Pleasant, obese, in no distress, normal affect ENT - no lesions, no post nasal drip, class 2-3 airway Neck: No JVD, no thyromegaly, no carotid bruits Lungs: no use of accessory muscles, no dullness to percussion, decreased without rales or rhonchi  Cardiovascular: Rhythm regular, heart sounds  normal, no murmurs or gallops, no peripheral edema Abdomen: soft and non-tender, no hepatosplenomegaly, BS normal. Musculoskeletal: No deformities, no cyanosis or clubbing Neuro:  alert, non focal, no tremors       Assessment & Plan:

## 2012-08-27 NOTE — Assessment & Plan Note (Signed)
Your cough appears to be post bronchitis - may be worsened by post nasal drip STOP moexipril - keep BP record - if high, contact us for Rx for new medication Use advair 500/50 twice daily instead of symbicort - sample Stay on prilosec Use chlortrimeton instead of benadryl Use sudafed twice daily x 3 wks Take DELSYM 2 tsp thrice daily x 3 wks for cough suppression

## 2012-08-28 NOTE — Assessment & Plan Note (Signed)
Will obtain spirometry in the future In the meantime stay on steroid laba combination such as Advair.

## 2012-10-08 ENCOUNTER — Ambulatory Visit (INDEPENDENT_AMBULATORY_CARE_PROVIDER_SITE_OTHER): Payer: BC Managed Care – PPO | Admitting: Pulmonary Disease

## 2012-10-08 ENCOUNTER — Encounter: Payer: Self-pay | Admitting: Pulmonary Disease

## 2012-10-08 VITALS — BP 134/92 | HR 74 | Temp 97.5°F | Ht 68.0 in | Wt 262.0 lb

## 2012-10-08 DIAGNOSIS — R05 Cough: Secondary | ICD-10-CM

## 2012-10-08 DIAGNOSIS — R059 Cough, unspecified: Secondary | ICD-10-CM

## 2012-10-08 DIAGNOSIS — J45909 Unspecified asthma, uncomplicated: Secondary | ICD-10-CM

## 2012-10-08 NOTE — Progress Notes (Signed)
  Subjective:    Patient ID: Michelle Kennedy, female    DOB: 07/23/1955, 57 y.o.   MRN: [id number]  HPI  57 year old never smoker referred for evaluation of chronic cough since January 2014.  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  She developed URI symptoms and was treated for sinusitis on 2/3. She was again on 2/26 treated for URI and asthma exacerbation with prednisone and inhalers. A dry cough has persisted since then. There is no diurnal variation and this does wake her up at night on occasion.Sinus symptoms resolved. No improvement in cough with prednisone. She is intolerant to flonase due to epistaxis, will try patanase. She was started on Zyrtec, increased prilosec from 20mg  to 40mg . She reports a rash on her back and tingling in her throat after taking Symbicort.  Of note she reports asthma since childhood, she was maintained on a regimen of Ventolin and Advair for many years before this was switched to Symbicort. She reports positive allergy testing to grass and Evergreen shrubs.  She reports a weight gain of 30 pounds. Medication review shows ACE inhibitor.  She reports exposure to tuberculosis in 1980 from her mother but she has always tested PPD negative  Chest x-ray did not show any infiltrates or effusions.  >>cough appears to be post bronchitis - may be worsened by post nasal drip  STOP moexipril - keep BP record - if high, contact us for Rx for new medication  Use advair 500/50 twice daily instead of symbicort - sample  Stay on prilosec  Use chlortrimeton instead of benadryl  Use sudafed twice daily x 3 wks  Take DELSYM 2 tsp thrice daily x 3 wks for cough suppression    10/08/2012 Cough 90% better - completed course of CPM & PSE & felt better immediately on starting Breathing has improved. Reports slight cough first thing in the AM, DOE. Denies chest tightness, chest pain. Spirometry - FEV1 83%,  ratio nml, FVC preserved - no obstruction  Review of Systems neg for any significant sore throat, dysphagia, itching, sneezing, nasal congestion or excess/ purulent secretions, fever, chills, sweats, unintended wt loss, pleuritic or exertional cp, hempoptysis, orthopnea pnd or change in chronic leg swelling. Also denies presyncope, palpitations, heartburn, abdominal pain, nausea, vomiting, diarrhea or change in bowel or urinary habits, dysuria,hematuria, rash, arthralgias, visual complaints, headache, numbness weakness or ataxia.      Objective:   Physical Exam  Gen. Pleasant, obese, in no distress ENT - no lesions, no post nasal drip Neck: No JVD, no thyromegaly, no carotid bruits Lungs: no use of accessory muscles, no dullness to percussion, decreased without rales or rhonchi  Cardiovascular: Rhythm regular, heart sounds  normal, no murmurs or gallops, no peripheral edema Musculoskeletal: No deformities, no cyanosis or clubbing , no tremors       Assessment & Plan:

## 2012-10-08 NOTE — Assessment & Plan Note (Signed)
Unclear if 'true 'asthma or related to allergies OK to stay off BP medication Stay on advair 250 twice daily & rescue albuterol  -Rx will be sent Lung function is good If uneventful , will step down

## 2012-10-08 NOTE — Patient Instructions (Addendum)
OK to stay off BP medication Stay on advair 250 twice daily & rescue albuterol  -Rx will be sent Lung function is good

## 2012-10-08 NOTE — Assessment & Plan Note (Signed)
Seems like post bronchitic cough , maybe upper airway component which is now resolved

## 2012-10-09 ENCOUNTER — Telehealth: Payer: Self-pay | Admitting: *Deleted

## 2012-10-09 NOTE — Telephone Encounter (Signed)
Received fax from [facility] requesting refill of simvastatin. Form forwarded to Provider for signature.

## 2012-10-11 MED ORDER — SIMVASTATIN 40 MG PO TABS: ORAL_TABLET | ORAL | Status: DC

## 2012-10-11 NOTE — Telephone Encounter (Signed)
Rx was faxed to [facility] at [phone] at 8:35am.

## 2012-11-21 ENCOUNTER — Telehealth: Payer: Self-pay | Admitting: *Deleted

## 2012-11-21 MED ORDER — MELOXICAM 7.5 MG PO TABS: ORAL_TABLET | ORAL | Status: DC

## 2012-11-21 NOTE — Telephone Encounter (Signed)
Received message from pt's husband requesting refill on pt's meloxicam. Refill sent. Left detailed message on cell# re: completion.

## 2012-11-25 ENCOUNTER — Telehealth: Payer: Self-pay | Admitting: *Deleted

## 2012-11-25 MED ORDER — ALBUTEROL SULFATE HFA 108 (90 BASE) MCG/ACT IN AERS: 2.0000 | INHALATION_SPRAY | Freq: Four times a day (QID) | RESPIRATORY_TRACT | Status: DC | PRN

## 2012-11-25 NOTE — Telephone Encounter (Signed)
Pt states she was px Oxybutynin for bladder incontinence; could not locate in EMR as active and/or historical; pt then stated that medication bottle shows Rx was prescribed by Oncology & she will contact their office RE: refills/SLS Pt request refills for Albuterol HFA; Rx to pharmacy/SLS

## 2012-11-26 ENCOUNTER — Telehealth: Payer: Self-pay | Admitting: Family

## 2012-11-26 NOTE — Telephone Encounter (Signed)
Caller: Bernard/Other; Phone: [phone]; Reason for Call: Adela Glimpse from [facility] calling.  States Ventolin HFA requires prior authorization; [employer] is on the preferred list for her insurance, but is not considered a generic equivalent.  He asks for prior authorization or to change Rx to ProAir.  Please follow up ASAP.  Thank you.

## 2012-11-26 NOTE — Telephone Encounter (Signed)
OK to switch from ProAir with same sig and disp same number and same rf

## 2012-11-26 NOTE — Telephone Encounter (Signed)
Please advise? If switch can be made or start a prior auth?

## 2012-11-27 MED ORDER — ALBUTEROL SULFATE HFA 108 (90 BASE) MCG/ACT IN AERS: 2.0000 | INHALATION_SPRAY | Freq: Four times a day (QID) | RESPIRATORY_TRACT | Status: DC | PRN

## 2012-12-02 ENCOUNTER — Telehealth: Payer: Self-pay | Admitting: *Deleted

## 2012-12-02 NOTE — Telephone Encounter (Signed)
Received fax from pharmacy requesting PA for Omeprazole 40 mg cap 30/30 via [facility] @[phone]; called & attempted to get fax sent over, automated system could not comprehend, will attempt to get fax from website/SLS

## 2012-12-10 NOTE — Telephone Encounter (Signed)
PA form completed & faxed for approval/SLS

## 2012-12-13 NOTE — Telephone Encounter (Signed)
Received fax stating that this patient "reviews are not handled by [facility]"?Clent Jacks

## 2012-12-18 NOTE — Telephone Encounter (Signed)
New PA form completed & faxed via [employer] [place] 07.29.14/SLS

## 2012-12-20 NOTE — Telephone Encounter (Signed)
Approval recvd for Omeprazole thru 01.31.15; fax to [facility] at [phone]/SLS

## 2013-01-07 ENCOUNTER — Ambulatory Visit: Payer: BC Managed Care – PPO | Admitting: Pulmonary Disease

## 2013-03-05 ENCOUNTER — Ambulatory Visit (INDEPENDENT_AMBULATORY_CARE_PROVIDER_SITE_OTHER): Payer: BC Managed Care – PPO | Admitting: Family

## 2013-03-05 ENCOUNTER — Encounter: Payer: Self-pay | Admitting: Family

## 2013-03-05 VITALS — BP 130/86 | HR 79 | Temp 97.9°F | Resp 16 | Ht 67.99 in | Wt 265.1 lb

## 2013-03-05 DIAGNOSIS — R030 Elevated blood-pressure reading, without diagnosis of hypertension: Secondary | ICD-10-CM | POA: Insufficient documentation

## 2013-03-05 DIAGNOSIS — Z23 Encounter for immunization: Secondary | ICD-10-CM

## 2013-03-05 NOTE — Progress Notes (Signed)
Subjective:    Patient ID: Michelle Kennedy, female    DOB: [birth year], 57 y.o.   MRN: [id number]  HPI  Michelle Kennedy is a 57 yr old female who presents today to discuss blood pressure.  Had elevate blood pressure at [facility] screening on Saturday night. Reading was 168/74.  She reports that generally when she checks at home her BP 130's/70's.  Denies cp, sob or unusual LE swelling.   She just had a crown placed today and is experiencing some discomfort from this.    Review of Systems See HPI  Past Medical History  Diagnosis Date  . Arthritis   . Asthma   . GERD (gastroesophageal reflux disease)   . Depression   . Allergy   . Hypertension   . Hyperlipidemia   . Diverticulitis 06/2009  . Migraine   . History of esophageal ulcer   . Colon polyps   . Cancer 2000    thyroid  . Urinary incontinence   . Hx: UTI (urinary tract infection)   . HYPOTHYROIDISM 12/20/2009    History   Social History  . Marital Status: Married    Spouse Name: N/A    Number of Children: N/A  . 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 Smoking status: Never Smoker   . Smokeless tobacco: Not on file  . Alcohol Use: No  . Drug Use: No  . Sexual Activity: Not on file   Other Topics Concern  . Not on file   Social History Narrative   Regular exercise: yes          Past Surgical History  Procedure Laterality Date  . Cholecystectomy  1990  . Breast surgery  2011    left breast biposy  . Appendectomy  2004  . Tonsillectomy and adenoidectomy  2000  . Abdominal hysterectomy  2004    also had bladder sling  . Thyroidectomy  2000  . Colonoscopy w/ polypectomy  06/2009    Family History  Problem Relation Age of Onset  . Alcohol abuse Mother   . Arthritis Mother   . Cancer Mother     ovarian, breast  . Hypertension Mother   . Bipolar disorder Mother   . Alcohol abuse Father   . Cancer Father     lung  . Hyperlipidemia Father   . Kidney  disease Father   . Diabetes Father   . Arthritis Maternal Grandmother   . Diabetes Paternal Grandmother     Allergies  Allergen Reactions  . Aspirin     REACTION: stomach sensitivity  . Codeine     REACTION: rash, dyspnea  . Erythromycin     REACTION: rash, dyspnea  . Iodine     REACTION: heart stopped  . Latex     REACTION: Swelling  . Lidocaine     REACTION: throat swelling  . Penicillins     REACTION: rash  . Pentazocine Lactate     REACTION: hives, difficulty swallowing  . Sulfonamide Derivatives     REACTION: rash    Current Outpatient Prescriptions on File Prior to Visit  Medication Sig Dispense Refill  . albuterol (PROVENTIL HFA;VENTOLIN HFA) 108 (90 BASE) MCG/ACT inhaler Inhale 2 puffs into the lungs every 6 (six) hours as needed. Please dispense Proair  1 Inhaler  1  . albuterol (PROVENTIL) (2.5 MG/3ML) 0.083% nebulizer solution Take 3 mLs (2.5 mg total) by nebulization every 6 (six) hours  as needed.  75 mL  5  . budesonide-formoterol (SYMBICORT) 160-4.5 MCG/ACT inhaler Inhale 2 puffs into the lungs 2 (two) times daily.  1 Inhaler  6  . Calcium Carbonate (CALCIUM 500 PO) Take 1 tablet by mouth 2 (two) times daily.        . cetirizine (ZYRTEC) 10 MG tablet Take 10 mg by mouth daily.      . colchicine 0.6 MG tablet 2 tabs by mouth now followed by 1 tablet one hour later. May repeat tomorrow if needed.  6 tablet  0  . diphenhydrAMINE (BENADRYL) 25 MG tablet Take 25 mg by mouth at bedtime as needed.      Marland Kitchen EPINEPHrine (EPI-PEN) 0.3 mg/0.3 mL DEVI Inject 0.3 mLs (0.3 mg total) into the muscle once. For severe allergic reaction.  1 Device  2  . Ergocalciferol (VITAMIN D2) 400 UNITS TABS Take 1 tablet by mouth daily.      Marland Kitchen levothyroxine (SYNTHROID, LEVOTHROID) 175 MCG tablet Take 2 tablets Mon, Wed, Fri and 1 tablet all other days.      . meloxicam (MOBIC) 7.5 MG tablet TAKE 1 TABLET ( 7.5 MG TOTAL ) BY MOUTH DAILY  90 tablet  1  . Multiple Vitamin (MULTIVITAMIN) tablet  Take 1 tablet by mouth daily.        . Olopatadine HCl 0.6 % SOLN 2 sprays each nostril twice daily  1 Bottle  2  . Omega-3 Fatty Acids (FISH OIL) 1200 MG CAPS Take 1 capsule by mouth daily.        Marland Kitchen omeprazole (PRILOSEC) 40 MG capsule Take 1 capsule (40 mg total) by mouth daily.  90 capsule  4  . PREMARIN 0.625 MG tablet Take 0.625 mg by mouth daily.       . simvastatin (ZOCOR) 40 MG tablet TAKE 1 TABLET (40MG ) BY MOUTH DAILY  90 tablet  1   No current facility-administered medications on file prior to visit.    BP 130/86  Pulse 79  Temp(Src) 97.9 F (36.6 C) (Oral)  Resp 16  Ht 5' 7.99" (1.727 m)  Wt 265 lb 1.3 oz (120.239 kg)  BMI 40.31 kg/m2  SpO2 97%       Objective:   Physical Exam  Constitutional: She is oriented to person, place, and time. She appears well-developed and well-nourished. No distress.  Cardiovascular: Normal rate and regular rhythm.   No murmur heard. Pulmonary/Chest: Effort normal and breath sounds normal. No respiratory distress. She has no wheezes. She has no rales. She exhibits no tenderness.  Neurological: She is alert and oriented to person, place, and time.  Psychiatric: She has a normal mood and affect. Her behavior is normal. Judgment and thought content normal.          Assessment & Plan:

## 2013-03-05 NOTE — Patient Instructions (Signed)
Please continue to work on a low sodium diet. Continue your exercise. Limit calories to 1200 a day for weight loss purposes. Follow up in 3 months.

## 2013-03-05 NOTE — Assessment & Plan Note (Addendum)
BP looks ok today in the office.  Recommended that she continue exercise, low sodium diet and weight loss efforts.  Repeat bp in 3 months. She would like flu shot today.

## 2013-03-20 ENCOUNTER — Other Ambulatory Visit: Payer: Self-pay | Admitting: *Deleted

## 2013-03-20 MED ORDER — SIMVASTATIN 40 MG PO TABS: ORAL_TABLET | ORAL | Status: DC

## 2013-03-20 NOTE — Telephone Encounter (Signed)
Rx request to pharmacy/SLS  

## 2013-05-12 ENCOUNTER — Telehealth: Payer: Self-pay | Admitting: *Deleted

## 2013-05-12 MED ORDER — SIMVASTATIN 40 MG PO TABS: ORAL_TABLET | ORAL | Status: DC

## 2013-05-12 NOTE — Telephone Encounter (Signed)
Received fax from [facility] requesting refills for simvastatin.  Refill faxed for #90 x no refills.

## 2013-05-20 ENCOUNTER — Other Ambulatory Visit: Payer: Self-pay | Admitting: Family

## 2013-05-20 NOTE — Telephone Encounter (Signed)
Meloxicam refill sent to mail order.  Pt is due for 3 month follow up in January.  Please call pt to arrange appt.

## 2013-05-21 NOTE — Telephone Encounter (Signed)
Informed patient of medication refill and she scheduled appointment for 03/23/14 °

## 2013-05-22 DIAGNOSIS — Z923 Personal history of irradiation: Secondary | ICD-10-CM

## 2013-05-22 DIAGNOSIS — Z9221 Personal history of antineoplastic chemotherapy: Secondary | ICD-10-CM

## 2013-05-22 HISTORY — DX: Personal history of antineoplastic chemotherapy: Z92.21

## 2013-05-22 HISTORY — DX: Personal history of irradiation: Z92.3

## 2013-06-02 ENCOUNTER — Ambulatory Visit (INDEPENDENT_AMBULATORY_CARE_PROVIDER_SITE_OTHER): Payer: BC Managed Care – PPO | Admitting: Family

## 2013-06-02 ENCOUNTER — Encounter: Payer: Self-pay | Admitting: Family

## 2013-06-02 VITALS — BP 132/90 | HR 82 | Temp 98.2°F | Resp 16 | Ht 67.0 in | Wt 266.0 lb

## 2013-06-02 DIAGNOSIS — Z780 Asymptomatic menopausal state: Secondary | ICD-10-CM | POA: Insufficient documentation

## 2013-06-02 DIAGNOSIS — R030 Elevated blood-pressure reading, without diagnosis of hypertension: Secondary | ICD-10-CM

## 2013-06-02 DIAGNOSIS — IMO0001 Reserved for inherently not codable concepts without codable children: Secondary | ICD-10-CM

## 2013-06-02 DIAGNOSIS — N951 Menopausal and female climacteric states: Secondary | ICD-10-CM

## 2013-06-02 MED ORDER — DULOXETINE HCL 30 MG PO CPEP
ORAL_CAPSULE | ORAL | Status: DC
Start: 2013-06-02 — End: 2013-06-06

## 2013-06-02 MED ORDER — ESTROGENS CONJUGATED 0.625 MG PO TABS: 0.6250 mg | ORAL_TABLET | Freq: Every day | ORAL | Status: DC

## 2013-06-02 NOTE — Patient Instructions (Signed)
Start Cymbalta 30mg  capsules once daily for week one, then increase to 60mg  (two capsules) once daily. Please proceed to the lab for your blood test. Schedule an appointment for mole removal. Follow up in 6 weeks.

## 2013-06-02 NOTE — Assessment & Plan Note (Signed)
BP today stable. I rechecked and got 135/70 in the left arm. Patient working on diet and exercise and is down four pounds.  Continue diet and exercise. Follow up in 6 weeks.

## 2013-06-02 NOTE — Assessment & Plan Note (Signed)
Continue Mobic 7.5mg , start Cymbalta. Obtain BMET Follow up in 6 weeks.

## 2013-06-02 NOTE — Progress Notes (Signed)
Pre visit review using our clinic review tool, if applicable. No additional management support is needed unless otherwise documented below in the visit note. 

## 2013-06-02 NOTE — Assessment & Plan Note (Signed)
Refilled Premarin x 1. Patient instructed to follow up with GYN for further refills.

## 2013-06-02 NOTE — Progress Notes (Signed)
Subjective:    Patient ID: Michelle Kennedy, female    DOB: 1956-03-21, 58 y.o.   MRN: [id number]  Hypertension Pertinent negatives include no chest pain or shortness of breath.   [name] is a 58 year old female who presents today for follow up.  Hypertension- Currently not on medication.   BP Readings from Last 3 Encounters:  06/02/13 132/90  03/05/13 130/86  10/08/12 134/92   Fibromyalgia-  Patient reports a gradual increase in feeling "achy" in the evenings and not wanting to move around due to pain. Patient taking 7.5mg  of Mobic and would like to increase her dose. Patient tried supplementing with Tylenol and experienced tinnitus.   Diet and exercise- Patient reports intake of fresh fruits and vegetables daily. Starting doing step aerobics after Christmas and is starting to walk daily. Patient reports her fibromyalgia pain has inhibited more frequent exercise.  Review of Systems  Constitutional: Positive for fatigue. Negative for appetite change.       Recent gradual increase in fatigue over past month. Patient relates this to fibromyalgia.   HENT: Negative for rhinorrhea.   Respiratory: Negative for cough and shortness of breath.   Cardiovascular: Negative for chest pain.  Musculoskeletal: Positive for myalgias.       Fibromyalgia.  Neurological: Negative for weakness.  Psychiatric/Behavioral: Positive for sleep disturbance.       Occasional wakes up with aches.   Past Medical History  Diagnosis Date  . Arthritis   . Asthma   . GERD (gastroesophageal reflux disease)   . Depression   . Allergy   . Hypertension   . Hyperlipidemia   . Diverticulitis 06/2009  . Migraine   . History of esophageal ulcer   . Colon polyps   . Cancer 2000    thyroid  . Urinary incontinence   . Hx: UTI (urinary tract infection)   . HYPOTHYROIDISM 12/20/2009    History   Social History  . Marital Status: Married    Spouse Name: N/A    Number of Children: N/A  . Years of Education: N/A    Occupational History  . 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 Smoking status: Never Smoker   . Smokeless tobacco: Not on file  . Alcohol Use: No  . Drug Use: No  . Sexual Activity: Not on file   Other Topics Concern  . Not on file   Social History Narrative   Regular exercise: yes          Past Surgical History  Procedure Laterality Date  . Cholecystectomy  1990  . Breast surgery  2011    left breast biposy  . Appendectomy  2004  . Tonsillectomy and adenoidectomy  2000  . Abdominal hysterectomy  2004    also had bladder sling  . Thyroidectomy  2000  . Colonoscopy w/ polypectomy  06/2009    Family History  Problem Relation Age of Onset  . Alcohol abuse Mother   . Arthritis Mother   . Cancer Mother     ovarian, breast  . Hypertension Mother   . Bipolar disorder Mother   . Alcohol abuse Father   . Cancer Father     lung  . Hyperlipidemia Father   . Kidney disease Father   . Diabetes Father   . Arthritis Maternal Grandmother   . Diabetes Paternal Grandmother     Allergies  Allergen Reactions  . Aspirin     REACTION: stomach sensitivity  . Codeine  REACTION: rash, dyspnea  . Erythromycin     REACTION: rash, dyspnea  . Iodine     REACTION: heart stopped  . Latex     REACTION: Swelling  . Lidocaine     REACTION: throat swelling  . Penicillins     REACTION: rash  . Pentazocine Lactate     REACTION: hives, difficulty swallowing  . Sulfonamide Derivatives     REACTION: rash    Current Outpatient Prescriptions on File Prior to Visit  Medication Sig Dispense Refill  . albuterol (PROVENTIL HFA;VENTOLIN HFA) 108 (90 BASE) MCG/ACT inhaler Inhale 2 puffs into the lungs every 6 (six) hours as needed. Please dispense Proair  1 Inhaler  1  . albuterol (PROVENTIL) (2.5 MG/3ML) 0.083% nebulizer solution Take 3 mLs (2.5 mg total) by nebulization every 6 (six) hours as needed.  75 mL  5  . budesonide-formoterol (SYMBICORT) 160-4.5 MCG/ACT inhaler Inhale 2  puffs into the lungs 2 (two) times daily.  1 Inhaler  6  . Calcium Carbonate (CALCIUM 500 PO) Take 1 tablet by mouth 2 (two) times daily.        . cetirizine (ZYRTEC) 10 MG tablet Take 10 mg by mouth daily.      . colchicine 0.6 MG tablet 2 tabs by mouth now followed by 1 tablet one hour later. May repeat tomorrow if needed.  6 tablet  0  . diphenhydrAMINE (BENADRYL) 25 MG tablet Take 25 mg by mouth at bedtime as needed.      Marland Kitchen EPINEPHrine (EPI-PEN) 0.3 mg/0.3 mL DEVI Inject 0.3 mLs (0.3 mg total) into the muscle once. For severe allergic reaction.  1 Device  2  . Ergocalciferol (VITAMIN D2) 400 UNITS TABS Take 1 tablet by mouth daily.      Marland Kitchen levothyroxine (SYNTHROID, LEVOTHROID) 175 MCG tablet Take 2 tablets Mon, Wed, Fri and 1 tablet all other days.      . meloxicam (MOBIC) 7.5 MG tablet TAKE 1 TABLET DAILY  90 tablet  1  . Multiple Vitamin (MULTIVITAMIN) tablet Take 1 tablet by mouth daily.        . Olopatadine HCl 0.6 % SOLN 2 sprays each nostril twice daily  1 Bottle  2  . Omega-3 Fatty Acids (FISH OIL) 1200 MG CAPS Take 1 capsule by mouth daily.        Marland Kitchen omeprazole (PRILOSEC) 40 MG capsule Take 1 capsule (40 mg total) by mouth daily.  90 capsule  4  . simvastatin (ZOCOR) 40 MG tablet TAKE 1 TABLET (40MG ) BY MOUTH DAILY  90 tablet  0   No current facility-administered medications on file prior to visit.    BP 132/90  Pulse 82  Temp(Src) 98.2 F (36.8 C) (Oral)  Resp 16  Ht 5\' 7"  (1.702 m)  Wt 266 lb (120.657 kg)  BMI 41.65 kg/m2  SpO2 98%       Objective:   Physical Exam  Constitutional: She is oriented to person, place, and time. She appears well-nourished.  HENT:  Head: Normocephalic.  Cardiovascular: Normal rate and regular rhythm.   Pulmonary/Chest: Effort normal and breath sounds normal.  Musculoskeletal: Normal range of motion.  Neurological: She is alert and oriented to person, place, and time.  Skin: Skin is warm and dry.  Psychiatric: She has a normal mood and  affect.          Assessment & Plan:

## 2013-06-03 ENCOUNTER — Telehealth: Payer: Self-pay | Admitting: Family

## 2013-06-03 ENCOUNTER — Encounter: Payer: Self-pay | Admitting: Family

## 2013-06-03 ENCOUNTER — Ambulatory Visit (INDEPENDENT_AMBULATORY_CARE_PROVIDER_SITE_OTHER): Payer: BC Managed Care – PPO | Admitting: Family

## 2013-06-03 VITALS — BP 130/90 | HR 57 | Temp 97.8°F | Resp 16 | Ht 67.0 in | Wt 266.0 lb

## 2013-06-03 DIAGNOSIS — D2260 Melanocytic nevi of unspecified upper limb, including shoulder: Secondary | ICD-10-CM

## 2013-06-03 DIAGNOSIS — D235 Other benign neoplasm of skin of trunk: Secondary | ICD-10-CM

## 2013-06-03 DIAGNOSIS — L989 Disorder of the skin and subcutaneous tissue, unspecified: Secondary | ICD-10-CM | POA: Insufficient documentation

## 2013-06-03 NOTE — Progress Notes (Signed)
Subjective:    Patient ID: Michelle Kennedy, female    DOB: [birth year], 58 y.o.   MRN: [id number]  HPI  [name] is a 58 yr old female who presents today for mole removal.   Review of Systems See HPI  Past Medical History  Diagnosis Date  . Arthritis   . Asthma   . GERD (gastroesophageal reflux disease)   . Depression   . Allergy   . Hypertension   . Hyperlipidemia   . Diverticulitis 06/2009  . Migraine   . History of esophageal ulcer   . Colon polyps   . Cancer 2000    thyroid  . Urinary incontinence   . Hx: UTI (urinary tract infection)   . HYPOTHYROIDISM 12/20/2009    History   Social History  . Marital Status: Married    Spouse Name: N/A    Number of Children: N/A  . 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 Smoking status: Never Smoker   . Smokeless tobacco: Not on file  . Alcohol Use: No  . Drug Use: No  . Sexual Activity: Not on file   Other Topics Concern  . Not on file   Social History Narrative   Regular exercise: yes          Past Surgical History  Procedure Laterality Date  . Cholecystectomy  1990  . Breast surgery  2011    left breast biposy  . Appendectomy  2004  . Tonsillectomy and adenoidectomy  2000  . Abdominal hysterectomy  2004    also had bladder sling  . Thyroidectomy  2000  . Colonoscopy w/ polypectomy  06/2009    Family History  Problem Relation Age of Onset  . Alcohol abuse Mother   . Arthritis Mother   . Cancer Mother     ovarian, breast  . Hypertension Mother   . Bipolar disorder Mother   . Alcohol abuse Father   . Cancer Father     lung  . Hyperlipidemia Father   . Kidney disease Father   . Diabetes Father   . Arthritis Maternal Grandmother   . Diabetes Paternal Grandmother     Allergies  Allergen Reactions  . Aspirin     REACTION: stomach sensitivity  . Codeine     REACTION: rash, dyspnea  . Erythromycin     REACTION: rash, dyspnea  . Iodine    REACTION: heart stopped  . Latex     REACTION: Swelling  . Lidocaine     REACTION: throat swelling  . Penicillins     REACTION: rash  . Pentazocine Lactate     REACTION: hives, difficulty swallowing  . Sulfonamide Derivatives     REACTION: rash    Current Outpatient Prescriptions on File Prior to Visit  Medication Sig Dispense Refill  . albuterol (PROVENTIL HFA;VENTOLIN HFA) 108 (90 BASE) MCG/ACT inhaler Inhale 2 puffs into the lungs every 6 (six) hours as needed. Please dispense Proair  1 Inhaler  1  . albuterol (PROVENTIL) (2.5 MG/3ML) 0.083% nebulizer solution Take 3 mLs (2.5 mg total) by nebulization every 6 (six) hours as needed.  75 mL  5  . budesonide-formoterol (SYMBICORT) 160-4.5 MCG/ACT inhaler Inhale 2 puffs into the lungs 2 (two) times daily.  1 Inhaler  6  . Calcium Carbonate (CALCIUM 500 PO) Take 1 tablet by mouth 2 (two) times daily.        . cetirizine (  ZYRTEC) 10 MG tablet Take 10 mg by mouth daily.      . colchicine 0.6 MG tablet 2 tabs by mouth now followed by 1 tablet one hour later. May repeat tomorrow if needed.  6 tablet  0  . diphenhydrAMINE (BENADRYL) 25 MG tablet Take 25 mg by mouth at bedtime as needed.      . DULoxetine (CYMBALTA) 30 MG capsule Take 1 tab by mouth once daily for 1 week, then increase to 2 tabs by mouth daily on week two.  60 capsule  1  . EPINEPHrine (EPI-PEN) 0.3 mg/0.3 mL DEVI Inject 0.3 mLs (0.3 mg total) into the muscle once. For severe allergic reaction.  1 Device  2  . Ergocalciferol (VITAMIN D2) 400 UNITS TABS Take 1 tablet by mouth daily.      Marland Kitchen estrogens, conjugated, (PREMARIN) 0.625 MG tablet Take 1 tablet (0.625 mg total) by mouth daily.  30 tablet  0  . levothyroxine (SYNTHROID, LEVOTHROID) 175 MCG tablet Take 2 tablets Mon, Wed, Fri and 1 tablet all other days.      . meloxicam (MOBIC) 7.5 MG tablet TAKE 1 TABLET DAILY  90 tablet  1  . Multiple Vitamin (MULTIVITAMIN) tablet Take 1 tablet by mouth daily.        . Olopatadine HCl  0.6 % SOLN 2 sprays each nostril twice daily  1 Bottle  2  . Omega-3 Fatty Acids (FISH OIL) 1200 MG CAPS Take 1 capsule by mouth daily.        Marland Kitchen omeprazole (PRILOSEC) 40 MG capsule Take 1 capsule (40 mg total) by mouth daily.  90 capsule  4  . oxybutynin (DITROPAN-XL) 5 MG 24 hr tablet Take 5 mg by mouth daily.      . simvastatin (ZOCOR) 40 MG tablet TAKE 1 TABLET (40MG ) BY MOUTH DAILY  90 tablet  0   No current facility-administered medications on file prior to visit.    BP 130/90  Pulse 57  Temp(Src) 97.8 F (36.6 C) (Oral)  Resp 16  Ht 5\' 7"  (1.702 m)  Wt 266 lb (120.657 kg)  BMI 41.65 kg/m2  SpO2 97%       Objective:   Physical Exam  Constitutional: She appears well-developed and well-nourished.  Skin: Skin is warm and dry.  Small lesion overlying right clavicle.   Psychiatric: She has a normal mood and affect. Her behavior is normal. Judgment and thought content normal.          Assessment & Plan:

## 2013-06-03 NOTE — Telephone Encounter (Signed)
PA for Duloxetine HCL DR 30MG  cap form received, forward to nurse

## 2013-06-03 NOTE — Patient Instructions (Signed)
Please keep area clean and dry today- ok to shower tomorrow. We will contact you with the results of your biopsy.

## 2013-06-03 NOTE — Assessment & Plan Note (Signed)
Procedure including risks/benefits explained to patient.  Questions were answered. After informed consent was obtained site was cleansed with  alcohol. Area was anesthetized with a freezing agent and shave biopsy was performed. Area was cauterized with silver nitrite.  Had stinging with silver nitrite and are therefore did not continue. Pressure applied to stop bleeding.  Pt tolerated procedure well.  Specimen sent for pathology review.  Pt instructed to keep the area dry for 24 hours and to contact us if he develops redness, drainage or swelling at the site.

## 2013-06-04 ENCOUNTER — Encounter: Payer: Self-pay | Admitting: Family

## 2013-06-04 NOTE — Telephone Encounter (Signed)
Signed.

## 2013-06-04 NOTE — Telephone Encounter (Signed)
Form faxed to [facility] at [phone]. Awaiting approval/denial status.

## 2013-06-04 NOTE — Telephone Encounter (Signed)
Form forwarded to Provider for completion/signature.

## 2013-06-05 ENCOUNTER — Telehealth: Payer: Self-pay | Admitting: Family

## 2013-06-05 NOTE — Telephone Encounter (Signed)
Anderson Malta from [employer] called with patients biopsy results.  Biopsy-right clavicle seborrheic keratosis inflammed.  Anderson Malta is also faxing over results.

## 2013-06-06 MED ORDER — DULOXETINE HCL 60 MG PO CPEP: 60.0000 mg | ORAL_CAPSULE | Freq: Every day | ORAL | Status: DC

## 2013-06-06 NOTE — Telephone Encounter (Signed)
Received fax from [place] that they could not access pt's account and PA has not been processed. Called [facility] and was transferred to [facility] [phone] and was told that Cymbalta 60mg  once daily is covered. Pt currently taking 30mg  2 daily and this would require prior authorization.  Is it ok to send new rx for 60mg  once a day to [facility]?

## 2013-06-06 NOTE — Telephone Encounter (Signed)
Rx sent for Cymbalta 60mg  once daily.  Left message on pt's cell# re: change in directions and to call if any questions.

## 2013-06-06 NOTE — Telephone Encounter (Signed)
Yes please. Thanks. 

## 2013-06-23 ENCOUNTER — Encounter: Payer: Self-pay | Admitting: Family

## 2013-06-23 ENCOUNTER — Ambulatory Visit (INDEPENDENT_AMBULATORY_CARE_PROVIDER_SITE_OTHER): Payer: BC Managed Care – PPO | Admitting: Family

## 2013-06-23 VITALS — BP 150/92 | HR 76 | Temp 97.7°F | Resp 16 | Ht 67.0 in | Wt 265.0 lb

## 2013-06-23 DIAGNOSIS — J019 Acute sinusitis, unspecified: Secondary | ICD-10-CM | POA: Insufficient documentation

## 2013-06-23 DIAGNOSIS — H6690 Otitis media, unspecified, unspecified ear: Secondary | ICD-10-CM | POA: Insufficient documentation

## 2013-06-23 MED ORDER — CEFUROXIME AXETIL 500 MG PO TABS: 500.0000 mg | ORAL_TABLET | Freq: Two times a day (BID) | ORAL | Status: DC

## 2013-06-23 NOTE — Assessment & Plan Note (Signed)
Bilateral. Rx with ceftin.

## 2013-06-23 NOTE — Assessment & Plan Note (Signed)
Acute sinusitis. Start Ceftin 500mg . Follow up in 2 to 3 days if symptoms are not improved.

## 2013-06-23 NOTE — Progress Notes (Signed)
Subjective:    Patient ID: Michelle Kennedy, female    DOB: 02/26/1956, 58 y.o.   MRN: [id number]  HPI Michelle Kennedy is a 58 year old female who presents today with a chief complaint of headache/sinus pressure, fatigue, sore throat, neck pain, and nausea x 7 days.  Patient reports tinnitus to bilateral ears, denies pain and reports fevers. Denies cough.  Patient has taken Sudafed and Tylenol without relief, patient reports laying still on one side makes head pressure worse.    Review of Systems  Constitutional: Positive for fever. Negative for chills.  HENT: Positive for congestion, sinus pressure and sore throat. Negative for ear pain and rhinorrhea.   Eyes: Positive for itching.  Respiratory: Negative for cough and shortness of breath.   Cardiovascular: Negative for chest pain.  Gastrointestinal: Positive for nausea. Negative for vomiting.  Neurological: Positive for headaches.   Past Medical History  Diagnosis Date  . Arthritis   . Asthma   . GERD (gastroesophageal reflux disease)   . Depression   . Allergy   . Hypertension   . Hyperlipidemia   . Diverticulitis 06/2009  . Migraine   . History of esophageal ulcer   . Colon polyps   . Cancer 2000    thyroid  . Urinary incontinence   . Hx: UTI (urinary tract infection)   . HYPOTHYROIDISM 12/20/2009    History   Social History  . Marital Status: Married    Spouse Name: N/A    Number of Children: N/A  . 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 Smoking status: Never Smoker   . Smokeless tobacco: Not on file  . Alcohol Use: No  . Drug Use: No  . Sexual Activity: Not on file   Other Topics Concern  . Not on file   Social History Narrative   Regular exercise: yes          Past Surgical History  Procedure Laterality Date  . Cholecystectomy  1990  . Breast surgery  2011    left breast biposy  . Appendectomy  2004  . Tonsillectomy and adenoidectomy  2000  .  Abdominal hysterectomy  2004    also had bladder sling  . Thyroidectomy  2000  . Colonoscopy w/ polypectomy  06/2009    Family History  Problem Relation Age of Onset  . Alcohol abuse Mother   . Arthritis Mother   . Cancer Mother     ovarian, breast  . Hypertension Mother   . Bipolar disorder Mother   . Alcohol abuse Father   . Cancer Father     lung  . Hyperlipidemia Father   . Kidney disease Father   . Diabetes Father   . Arthritis Maternal Grandmother   . Diabetes Paternal Grandmother     Allergies  Allergen Reactions  . Aspirin     REACTION: stomach sensitivity  . Codeine     REACTION: rash, dyspnea  . Erythromycin     REACTION: rash, dyspnea  . Iodine     REACTION: heart stopped  . Latex     REACTION: Swelling  . Lidocaine     REACTION: throat swelling  . Penicillins     REACTION: rash  . Pentazocine Lactate     REACTION: hives, difficulty swallowing  . Sulfonamide Derivatives     REACTION: rash    Current Outpatient Prescriptions on File Prior to Visit  Medication Sig  Dispense Refill  . albuterol (PROVENTIL HFA;VENTOLIN HFA) 108 (90 BASE) MCG/ACT inhaler Inhale 2 puffs into the lungs every 6 (six) hours as needed. Please dispense Proair  1 Inhaler  1  . albuterol (PROVENTIL) (2.5 MG/3ML) 0.083% nebulizer solution Take 3 mLs (2.5 mg total) by nebulization every 6 (six) hours as needed.  75 mL  5  . budesonide-formoterol (SYMBICORT) 160-4.5 MCG/ACT inhaler Inhale 2 puffs into the lungs 2 (two) times daily.  1 Inhaler  6  . Calcium Carbonate (CALCIUM 500 PO) Take 1 tablet by mouth 2 (two) times daily.        . cetirizine (ZYRTEC) 10 MG tablet Take 10 mg by mouth daily.      . colchicine 0.6 MG tablet 2 tabs by mouth now followed by 1 tablet one hour later. May repeat tomorrow if needed.  6 tablet  0  . diphenhydrAMINE (BENADRYL) 25 MG tablet Take 25 mg by mouth at bedtime as needed.      . DULoxetine (CYMBALTA) 60 MG capsule Take 1 capsule (60 mg total) by  mouth daily.  90 capsule  1  . EPINEPHrine (EPI-PEN) 0.3 mg/0.3 mL DEVI Inject 0.3 mLs (0.3 mg total) into the muscle once. For severe allergic reaction.  1 Device  2  . Ergocalciferol (VITAMIN D2) 400 UNITS TABS Take 1 tablet by mouth daily.      Marland Kitchen estrogens, conjugated, (PREMARIN) 0.625 MG tablet Take 1 tablet (0.625 mg total) by mouth daily.  30 tablet  0  . levothyroxine (SYNTHROID, LEVOTHROID) 175 MCG tablet Take 2 tablets Mon, Wed, Fri and 1 tablet all other days.      . meloxicam (MOBIC) 7.5 MG tablet TAKE 1 TABLET DAILY  90 tablet  1  . Multiple Vitamin (MULTIVITAMIN) tablet Take 1 tablet by mouth daily.        . Olopatadine HCl 0.6 % SOLN 2 sprays each nostril twice daily  1 Bottle  2  . Omega-3 Fatty Acids (FISH OIL) 1200 MG CAPS Take 1 capsule by mouth daily.        Marland Kitchen omeprazole (PRILOSEC) 40 MG capsule Take 1 capsule (40 mg total) by mouth daily.  90 capsule  4  . oxybutynin (DITROPAN-XL) 5 MG 24 hr tablet Take 5 mg by mouth daily.      . simvastatin (ZOCOR) 40 MG tablet TAKE 1 TABLET (40MG ) BY MOUTH DAILY  90 tablet  0   No current facility-administered medications on file prior to visit.    BP 150/92  Pulse 76  Temp(Src) 97.7 F (36.5 C) (Oral)  Resp 16  Ht 5\' 7"  (1.702 m)  Wt 265 lb (120.203 kg)  BMI 41.50 kg/m2  SpO2 99%       Objective:   Physical Exam  Constitutional: She is oriented to person, place, and time. She appears well-nourished.  HENT:  Head: Normocephalic.  Right Ear: There is drainage.  Left Ear: There is drainage.  Nose: Right sinus exhibits maxillary sinus tenderness and frontal sinus tenderness. Left sinus exhibits maxillary sinus tenderness and frontal sinus tenderness.  Mouth/Throat: Oropharynx is clear and moist.  R TM is noted to have yellow effusion, no erythema L TM with milder yellow effusion, no erythema  Eyes: Pupils are equal, round, and reactive to light.  Neck: Neck supple.  Cardiovascular: Normal rate and regular rhythm.     Pulmonary/Chest: Effort normal and breath sounds normal. No respiratory distress. She has no wheezes.  Lymphadenopathy:    She has no  cervical adenopathy.  Neurological: She is alert and oriented to person, place, and time.  Skin: Skin is warm and dry.  Psychiatric: She has a normal mood and affect.          Assessment & Plan:  I have personally seen and examined patient and agree with Alma Friendly NP student's assessment and plan.

## 2013-06-23 NOTE — Patient Instructions (Signed)
Start Ceftin 500mg  twice daily. Follow up if symptoms do not improve within 2 to 3 days.

## 2013-08-04 ENCOUNTER — Telehealth: Payer: Self-pay | Admitting: *Deleted

## 2013-08-04 DIAGNOSIS — E785 Hyperlipidemia, unspecified: Secondary | ICD-10-CM

## 2013-08-04 MED ORDER — SIMVASTATIN 40 MG PO TABS: ORAL_TABLET | ORAL | Status: DC

## 2013-08-04 NOTE — Telephone Encounter (Signed)
Received fax from [facility] requesting refill of simvastatin.  Request faxed to [phone], #90 x no refills. Pt saw [place] in February for acute issue and twice in January.  When should pt follow up with [place] in the future?

## 2013-08-04 NOTE — Telephone Encounter (Signed)
Pt is past due for labs. Please ask her to come to lab for FLP/LFT dx is hyperlipidemia.  Needs office visit 1-2 months.

## 2013-08-05 NOTE — Telephone Encounter (Signed)
Left message on home # to return my call. 

## 2013-08-08 NOTE — Telephone Encounter (Signed)
Notified pt and she voices understanding.  Lab order entered. Follow up scheduled for 09/24/13 at 10:30am.

## 2013-08-12 LAB — LIPID PANEL
CHOL/HDL RATIO: 3.2 ratio
Cholesterol: 193 mg/dL (ref 0–200)
HDL: 61 mg/dL (ref >39)
LDL CALC: 104 mg/dL — AB (ref 0–99)
TRIGLYCERIDES: 138 mg/dL (ref <150)
VLDL: 28 mg/dL (ref 0–40)

## 2013-08-12 LAB — HEPATIC FUNCTION PANEL
ALK PHOS: 52 U/L (ref 39–117)
ALT: 24 U/L (ref 0–35)
AST: 19 U/L (ref 0–37)
Albumin: 3.8 g/dL (ref 3.5–5.2)
BILIRUBIN DIRECT: 0.1 mg/dL (ref 0.0–0.3)
BILIRUBIN INDIRECT: 0.3 mg/dL (ref 0.2–1.2)
BILIRUBIN TOTAL: 0.4 mg/dL (ref 0.2–1.2)
TOTAL PROTEIN: 6.4 g/dL (ref 6.0–8.3)

## 2013-08-19 ENCOUNTER — Telehealth: Payer: Self-pay | Admitting: *Deleted

## 2013-08-19 MED ORDER — FLUTICASONE-SALMETEROL 250-50 MCG/DOSE IN AEPB: 1.0000 | INHALATION_SPRAY | Freq: Two times a day (BID) | RESPIRATORY_TRACT | Status: DC

## 2013-08-19 MED ORDER — ALBUTEROL SULFATE HFA 108 (90 BASE) MCG/ACT IN AERS: 2.0000 | INHALATION_SPRAY | Freq: Four times a day (QID) | RESPIRATORY_TRACT | Status: DC | PRN

## 2013-08-19 NOTE — Telephone Encounter (Signed)
Pt left message requesting refills of advair and albuterol inhalers to [facility].  Advair no longer on med list. Spoke with pt, states she has gotten refills through her pulmonologist last.  Refills sent.

## 2013-08-27 ENCOUNTER — Telehealth: Payer: Self-pay | Admitting: *Deleted

## 2013-08-27 NOTE — Telephone Encounter (Signed)
Received message from [facility] that Advair is not on formulary.  Preferred alternatives are:  Pulmicort, symbicort, dulera and QVAR. Per phone note of 2013, we tried for approval at that time and it was denied. Pt was placed on Symbicort. Left message for pt to return my call and let us know how symptoms have been maintained on Symbicort. Per phone note of 07/2013 pt had stated that her pulmonologist had been giving her advair but medication was not on current med list at that time as it appeared to have fallen off or expired.  Pt may need to contact pulmonologist to obtain prior auth if they have been prescribing it or giving her samples.

## 2013-08-29 NOTE — Telephone Encounter (Signed)
Pt states she developed a rash on her throat after using Symbicort. Called [employer] @ [phone] and spoke to [place]. Authorization is good from 08/29/13 x 2 years. Notified Olin Hauser at [facility] and she states medication will be shipped on 09/04/13.

## 2013-09-01 ENCOUNTER — Encounter: Payer: Self-pay | Admitting: Adult Health

## 2013-09-01 ENCOUNTER — Ambulatory Visit (INDEPENDENT_AMBULATORY_CARE_PROVIDER_SITE_OTHER): Payer: BC Managed Care – PPO | Admitting: Adult Health

## 2013-09-01 VITALS — BP 132/98 | HR 77 | Temp 97.7°F | Ht 68.0 in | Wt 268.0 lb

## 2013-09-01 DIAGNOSIS — R059 Cough, unspecified: Secondary | ICD-10-CM

## 2013-09-01 DIAGNOSIS — R05 Cough: Secondary | ICD-10-CM

## 2013-09-01 MED ORDER — BENZONATATE 200 MG PO CAPS: 200.0000 mg | ORAL_CAPSULE | Freq: Three times a day (TID) | ORAL | Status: DC | PRN

## 2013-09-01 MED ORDER — LEVALBUTEROL HCL 0.63 MG/3ML IN NEBU
0.6300 mg | INHALATION_SOLUTION | Freq: Once | RESPIRATORY_TRACT | Status: AC
  Administered 2013-09-01: 0.63 mg via RESPIRATORY_TRACT

## 2013-09-01 MED ORDER — ALBUTEROL SULFATE HFA 108 (90 BASE) MCG/ACT IN AERS: 2.0000 | INHALATION_SPRAY | Freq: Four times a day (QID) | RESPIRATORY_TRACT | Status: DC | PRN

## 2013-09-01 NOTE — Patient Instructions (Signed)
Restart Advair 250/50 1 puff Twice daily   Stay on Prilosec 20mg  daily before meal  Begin Zyrtec 10mg  daily  USe Chlrotrimeton 4mg  2 At bedtime   Take DELSYM 2 tsp thrice daily x 3 wks for cough  Tessalon 200mg  Three times a day  For cough .  Saline Nasal rinses As needed   Follow up Dr. Elsworth Soho  In 4 weeks in [facility] and As needed   Please contact office for sooner follow up if symptoms do not improve or worsen or seek emergency care

## 2013-09-01 NOTE — Progress Notes (Signed)
Subjective:    Patient ID: Michelle Kennedy, female    DOB: [birth year], 58 y.o.   MRN: [id number]  HPI   58 year old never smoker referred for evaluation of chronic cough since January 2014.  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  She developed URI symptoms and was treated for sinusitis on 2/3. She was again on 2/26 treated for URI and asthma exacerbation with prednisone and inhalers. A dry cough has persisted since then. There is no diurnal variation and this does wake her up at night on occasion.Sinus symptoms resolved. No improvement in cough with prednisone. She is intolerant to flonase due to epistaxis, will try patanase. She was started on Zyrtec, increased prilosec from 20mg  to 40mg . She reports a rash on her back and tingling in her throat after taking Symbicort.  Of note she reports asthma since childhood, she was maintained on a regimen of Ventolin and Advair for many years before this was switched to Symbicort. She reports positive allergy testing to grass and [place] shrubs.  She reports a weight gain of 30 pounds. Medication review shows ACE inhibitor.  She reports exposure to tuberculosis in 1980 from her mother but she has always tested PPD negative  Chest x-ray did not show any infiltrates or effusions.  >>cough appears to be post bronchitis - may be worsened by post nasal drip  STOP moexipril - keep BP record - if high, contact us for Rx for new medication  Use advair 500/50 twice daily instead of symbicort - sample  Stay on prilosec  Use chlortrimeton instead of benadryl  Use sudafed twice daily x 3 wks  Take DELSYM 2 tsp thrice daily x 3 wks for cough suppression   10/08/12  Cough 90% better - completed course of CPM & PSE & felt better immediately on starting Breathing has improved. Reports slight cough first thing in the AM, DOE. Denies chest tightness, chest pain. Spirometry - FEV1 83%,  ratio nml, FVC preserved - no obstruction >>cont on Advair   09/01/2013 Acute OV  Presents for return of cough. Was seen last year with cough. ACE inhibitor stopped. Placed on advair along with AR/GERD prevention w/ resolution of cough.  Says she has done well. Cough resolved until 1 week ago.  Complains 1  week ago cough came back and pt c/o waking up in morning coughing and complaining she can't breathe. C/o SOB and chest tightness with activity.  Complains of drippy drainge in throat .  No fever, recent travel or abx use.  No chest pain , orthopnea or n/v/d/.  Has been off Advair for several months. Not taking any allergy meds .  Remains on prilosec daily .  Says symptoms started out like a cold with allergy symptoms. Feels the high pollen count is to blame.      Review of Systems  neg for any significant sore throat, dysphagia, itching, sneezing,   excess/ purulent secretions, fever, chills, sweats, unintended wt loss, pleuritic or exertional cp, hempoptysis, orthopnea pnd or change in chronic leg swelling. Also denies presyncope, palpitations, heartburn, abdominal pain, nausea, vomiting, diarrhea or change in bowel or urinary habits, dysuria,hematuria, rash, arthralgias, visual complaints, headache, numbness weakness or ataxia.      Objective:   Physical Exam   Gen. Pleasant, obese, in no distress ENT - no lesions, no post nasal drip Neck: No JVD, no thyromegaly, no carotid bruits Lungs: no  use of accessory muscles, no dullness to percussion, decreased without rales or rhonchi  Cardiovascular: Rhythm regular, heart sounds  normal, no murmurs or gallops, no peripheral edema Musculoskeletal: No deformities, no cyanosis or clubbing , no tremors       Assessment & Plan:

## 2013-09-01 NOTE — Assessment & Plan Note (Signed)
Prolonged cough vs asthma flare s/p URI w/ Rhinitis flare   Plan  Restart Advair 250/50 1 puff Twice daily   Stay on Prilosec 20mg  daily before meal  Begin Zyrtec 10mg  daily  USe Chlrotrimeton 4mg  2 At bedtime   Take DELSYM 2 tsp thrice daily x 3 wks for cough  Tessalon 200mg  Three times a day  For cough .  Saline Nasal rinses As needed   Follow up Dr. Elsworth Soho  In 4 weeks in [facility] and As needed   Please contact office for sooner follow up if symptoms do not improve or worsen or seek emergency care

## 2013-09-06 ENCOUNTER — Other Ambulatory Visit: Payer: Self-pay | Admitting: Family

## 2013-09-24 ENCOUNTER — Encounter: Payer: Self-pay | Admitting: Family

## 2013-09-24 ENCOUNTER — Ambulatory Visit (INDEPENDENT_AMBULATORY_CARE_PROVIDER_SITE_OTHER): Payer: BC Managed Care – PPO | Admitting: Family

## 2013-09-24 VITALS — BP 150/90 | HR 72 | Temp 97.7°F | Resp 18 | Ht 67.0 in | Wt 267.0 lb

## 2013-09-24 DIAGNOSIS — J302 Other seasonal allergic rhinitis: Secondary | ICD-10-CM | POA: Insufficient documentation

## 2013-09-24 DIAGNOSIS — E039 Hypothyroidism, unspecified: Secondary | ICD-10-CM

## 2013-09-24 DIAGNOSIS — F329 Major depressive disorder, single episode, unspecified: Secondary | ICD-10-CM

## 2013-09-24 DIAGNOSIS — J309 Allergic rhinitis, unspecified: Secondary | ICD-10-CM

## 2013-09-24 DIAGNOSIS — F3289 Other specified depressive episodes: Secondary | ICD-10-CM

## 2013-09-24 LAB — TSH: TSH: 0.084 u[IU]/mL — AB (ref 0.350–4.500)

## 2013-09-24 MED ORDER — EPINEPHRINE 0.3 MG/0.3ML IJ SOAJ: 0.3000 mg | Freq: Once | INTRAMUSCULAR | Status: DC

## 2013-09-24 NOTE — Assessment & Plan Note (Signed)
Check TSH. Continue synthroid.  °

## 2013-09-24 NOTE — Patient Instructions (Signed)
Please complete lab work prior to leaving. Please schedule a follow up appointment in 6 months.

## 2013-09-24 NOTE — Progress Notes (Signed)
Pre visit review using our clinic review tool, if applicable. No additional management support is needed unless otherwise documented below in the visit note. 

## 2013-09-24 NOTE — Assessment & Plan Note (Signed)
Improved on cymbalta. Continue same.

## 2013-09-24 NOTE — Progress Notes (Signed)
Subjective:    Patient ID: Michelle Kennedy, female    DOB: 1956/02/11, 58 y.o.   MRN: [id number]  HPI  [name] is a 58 yr old female who presents today for follow up of multiple medical problems.  Depression- Reports symptoms are improved on cymbalta.  Hypothyroid- continues synthroid Lab Results  Component Value Date   TSH 1.740 04/05/2011   Itching/aching in her right ear. No pain in left ear.  Hyperlipidemia-  Stable on statin. Lab Results  Component Value Date   CHOL 193 08/11/2013   HDL 61 08/11/2013   LDLCALC 104* 08/11/2013   TRIG 138 08/11/2013   CHOLHDL 3.2 08/11/2013     Review of Systems See HPI  Past Medical History  Diagnosis Date  . Arthritis   . Asthma   . GERD (gastroesophageal reflux disease)   . Depression   . Allergy   . Hypertension   . Hyperlipidemia   . Diverticulitis 06/2009  . Migraine   . History of esophageal ulcer   . Colon polyps   . Cancer 2000    thyroid  . Urinary incontinence   . Hx: UTI (urinary tract infection)   . HYPOTHYROIDISM 12/20/2009    History   Social History  . Marital Status: Married    Spouse Name: N/A    Number of Children: N/A  . 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 Smoking status: Never Smoker   . Smokeless tobacco: Not on file  . Alcohol Use: No  . Drug Use: No  . Sexual Activity: Not on file   Other Topics Concern  . Not on file   Social History Narrative   Regular exercise: yes          Past Surgical History  Procedure Laterality Date  . Cholecystectomy  1990  . Breast surgery  2011    left breast biposy  . Appendectomy  2004  . Tonsillectomy and adenoidectomy  2000  . Abdominal hysterectomy  2004    also had bladder sling  . Thyroidectomy  2000  . Colonoscopy w/ polypectomy  06/2009    Family History  Problem Relation Age of Onset  . Alcohol abuse Mother   . Arthritis Mother   . Cancer Mother     ovarian, breast  .  Hypertension Mother   . Bipolar disorder Mother   . Alcohol abuse Father   . Cancer Father     lung  . Hyperlipidemia Father   . Kidney disease Father   . Diabetes Father   . Arthritis Maternal Grandmother   . Diabetes Paternal Grandmother     Allergies  Allergen Reactions  . Aspirin     REACTION: stomach sensitivity  . Codeine     REACTION: rash, dyspnea  . Erythromycin     REACTION: rash, dyspnea  . Iodine     REACTION: heart stopped  . Latex     REACTION: Swelling  . Lidocaine     REACTION: throat swelling  . Penicillins     REACTION: rash  . Pentazocine Lactate     REACTION: hives, difficulty swallowing  . Sulfonamide Derivatives     REACTION: rash  . Symbicort [Budesonide-Formoterol Fumarate] Rash    Current Outpatient Prescriptions on File Prior to Visit  Medication Sig Dispense Refill  . albuterol (PROVENTIL HFA;VENTOLIN HFA) 108 (90 BASE) MCG/ACT inhaler Inhale 2 puffs into the lungs every 6 (six)  hours as needed for wheezing or shortness of breath. Please dispense Proair  8.5 g  5  . benzonatate (TESSALON) 200 MG capsule Take 1 capsule (200 mg total) by mouth 3 (three) times daily as needed for cough.  30 capsule  1  . Calcium Carbonate (CALCIUM 500 PO) Take 1 tablet by mouth 2 (two) times daily.        . cetirizine (ZYRTEC) 10 MG tablet Take 10 mg by mouth daily.      . colchicine 0.6 MG tablet 2 tabs by mouth now followed by 1 tablet one hour later. May repeat tomorrow if needed.  6 tablet  0  . diphenhydrAMINE (BENADRYL) 25 MG tablet Take 25 mg by mouth at bedtime as needed.      . DULoxetine (CYMBALTA) 60 MG capsule Take 1 capsule (60 mg total) by mouth daily.  90 capsule  1  . Ergocalciferol (VITAMIN D2) 400 UNITS TABS Take 1 tablet by mouth daily.      Marland Kitchen estrogens, conjugated, (PREMARIN) 0.625 MG tablet Take 1 tablet (0.625 mg total) by mouth daily.  30 tablet  0  . Fluticasone-Salmeterol (ADVAIR DISKUS) 250-50 MCG/DOSE AEPB Inhale 1 puff into the lungs 2  (two) times daily.  180 each  1  . levothyroxine (SYNTHROID, LEVOTHROID) 175 MCG tablet Take 2 tablets Mon, Wed, Fri and 1 tablet all other days.      . meloxicam (MOBIC) 7.5 MG tablet TAKE 1 TABLET DAILY  90 tablet  1  . Multiple Vitamin (MULTIVITAMIN) tablet Take 1 tablet by mouth daily.        . Olopatadine HCl 0.6 % SOLN 2 sprays each nostril twice daily  1 Bottle  2  . Omega-3 Fatty Acids (FISH OIL) 1200 MG CAPS Take 1 capsule by mouth daily.        Marland Kitchen omeprazole (PRILOSEC) 40 MG capsule TAKE ONE CAPSULE BY MOUTH EVERY DAY  90 capsule  3  . oxybutynin (DITROPAN-XL) 5 MG 24 hr tablet Take 5 mg by mouth daily.      . simvastatin (ZOCOR) 40 MG tablet TAKE 1 TABLET (40MG ) BY MOUTH DAILY  90 tablet  0   No current facility-administered medications on file prior to visit.    BP 150/90  Pulse 72  Temp(Src) 97.7 F (36.5 C) (Oral)  Resp 18  Ht 5\' 7"  (1.702 m)  Wt 267 lb (121.11 kg)  BMI 41.81 kg/m2  SpO2 96%       Objective:   Physical Exam  Constitutional: She is oriented to person, place, and time. She appears well-developed and well-nourished. No distress.  HENT:  Head: Normocephalic and atraumatic.  Serous effusion left ear without bulging or erythema  Cardiovascular: Normal rate and regular rhythm.   No murmur heard. Pulmonary/Chest: Effort normal and breath sounds normal. No respiratory distress. She has no wheezes. She has no rales. She exhibits no tenderness.  Musculoskeletal: She exhibits no edema.  Neurological: She is alert and oriented to person, place, and time.  Skin: Skin is warm and dry.  Psychiatric: She has a normal mood and affect. Her behavior is normal. Judgment and thought content normal.          Assessment & Plan:

## 2013-09-24 NOTE — Assessment & Plan Note (Signed)
Continue zyrtec and prn sudafed.  This should also help with ear congestion.

## 2013-09-26 ENCOUNTER — Telehealth: Payer: Self-pay | Admitting: Family

## 2013-09-26 DIAGNOSIS — E039 Hypothyroidism, unspecified: Secondary | ICD-10-CM

## 2013-09-26 MED ORDER — LEVOTHYROXINE SODIUM 150 MCG PO TABS: 150.0000 ug | ORAL_TABLET | Freq: Every day | ORAL | Status: DC

## 2013-09-26 NOTE — Telephone Encounter (Signed)
Please contact pt and let her know that her thyroid test shows we need to decrease her thyroid dose. Recommend that she decrease synthroid from 193mcg to 164mcg.  Repeat TSH in 6 weeks. She should arrange follow up with Dr. Tamala Julian Endo.

## 2013-09-26 NOTE — Telephone Encounter (Signed)
Notified pt and she voices understanding. Lab order entered. 

## 2013-10-20 ENCOUNTER — Telehealth: Payer: Self-pay | Admitting: *Deleted

## 2013-10-20 NOTE — Telephone Encounter (Signed)
Rx faxed, notified pt.

## 2013-10-20 NOTE — Telephone Encounter (Signed)
See rx. 

## 2013-10-20 NOTE — Telephone Encounter (Signed)
Pt left message requesting rx for new portable nebulizer be faxed to [phone], order # [id number]. Pt states she has already paid for machine she just needs to get Rx faxed. Please advise.

## 2013-10-22 MED ORDER — ALBUTEROL SULFATE (2.5 MG/3ML) 0.083% IN NEBU: 2.5000 mg | INHALATION_SOLUTION | Freq: Four times a day (QID) | RESPIRATORY_TRACT | Status: DC | PRN

## 2013-10-22 NOTE — Addendum Note (Signed)
Addended by: Kelle Darting A on: 10/22/2013 12:00 PM   Modules accepted: Orders

## 2013-10-22 NOTE — Telephone Encounter (Signed)
Pt called back stating she is out of refills of albuterol for her nebulizer. Refill sent to [facility] per pt request.

## 2013-11-11 ENCOUNTER — Other Ambulatory Visit: Payer: Self-pay | Admitting: Family

## 2013-11-19 ENCOUNTER — Telehealth: Payer: Self-pay | Admitting: Family

## 2013-11-19 MED ORDER — DULOXETINE HCL 60 MG PO CPEP: 60.0000 mg | ORAL_CAPSULE | Freq: Every day | ORAL | Status: DC

## 2013-11-19 MED ORDER — SIMVASTATIN 40 MG PO TABS: ORAL_TABLET | ORAL | Status: DC

## 2013-11-19 NOTE — Telephone Encounter (Signed)
Refill-simvastatin  Refill- duloxetine  [facility]

## 2013-11-19 NOTE — Telephone Encounter (Signed)
Pt also left message on voicemail requesting status of cymbalta refill as she was told it was recently denied. Refills sent. Left message on pt's voicemail notifying her of refill completions and to call if any further questions.

## 2013-12-03 ENCOUNTER — Other Ambulatory Visit: Payer: Self-pay | Admitting: Family

## 2013-12-03 NOTE — Telephone Encounter (Signed)
eScribe request from [facility] for refill on Meloxicam 7.5 mg Last filled - 12.30.14, #90x1 Last AEX - 05.06.15 Next AEX - 6 Mths. Please Advise on refills/SLS

## 2013-12-05 ENCOUNTER — Ambulatory Visit (INDEPENDENT_AMBULATORY_CARE_PROVIDER_SITE_OTHER): Payer: BC Managed Care – PPO | Admitting: Medical

## 2013-12-05 ENCOUNTER — Encounter: Payer: Self-pay | Admitting: Medical

## 2013-12-05 VITALS — BP 126/90 | HR 69 | Temp 98.1°F | Resp 18 | Ht 67.0 in | Wt 264.0 lb

## 2013-12-05 DIAGNOSIS — J0101 Acute recurrent maxillary sinusitis: Secondary | ICD-10-CM

## 2013-12-05 DIAGNOSIS — K0510 Chronic gingivitis, plaque induced: Secondary | ICD-10-CM | POA: Insufficient documentation

## 2013-12-05 DIAGNOSIS — J0100 Acute maxillary sinusitis, unspecified: Secondary | ICD-10-CM

## 2013-12-05 MED ORDER — CEFDINIR 300 MG PO CAPS: 300.0000 mg | ORAL_CAPSULE | Freq: Two times a day (BID) | ORAL | Status: DC

## 2013-12-05 NOTE — Patient Instructions (Signed)
Please take cefdinir for sinusitis and take oral rinse prescribed by her dentist for palate/gingiva irritation. Could also use was salt water rinses. If symptoms persist change or worse notify us/return. Particular for sinus pressure. Regarding red inflamed area on palate behind upper incisors see dentist early next week if those symptoms persisting.

## 2013-12-05 NOTE — Progress Notes (Signed)
Pre visit review using our clinic review tool, if applicable. No additional management support is needed unless otherwise documented below in the visit note. 

## 2013-12-05 NOTE — Assessment & Plan Note (Signed)
Pt treated today with cefdinir. Will follow and see if clears maxillary sinus pressure. Continue allergy meds.

## 2013-12-05 NOTE — Progress Notes (Signed)
Subjective:    Patient ID: Michelle Kennedy, female    DOB: [birth year], 58 y.o.   MRN: [id number]  HPI  Pt in with some pain in roof of her mouth. Pt states she has had infection in palate in the past. She describes one time had infection and then later area drained. She states this has happened twice. One time 3 yrs ago. Another time 1 yr ago. Pt not diabetic. Pt called her dentist but he is out of town. He is one treated before and he rx'd antibiotic. Pt states woke up yesterday and palate was sensitive. Today area is a lot more tender. No obvious fever, or chills but taking ibuprofen about every 8 hour. Pt also had sinus pressure 2 wks ago and used netty pot. Still some mild sinus pressure. Preceded by allergy symptoms which are controlled now.   Past Medical History  Diagnosis Date  . Arthritis   . Asthma   . GERD (gastroesophageal reflux disease)   . Depression   . Allergy   . Hypertension   . Hyperlipidemia   . Diverticulitis 06/2009  . Migraine   . History of esophageal ulcer   . Colon polyps   . Cancer 2000    thyroid  . Urinary incontinence   . Hx: UTI (urinary tract infection)   . HYPOTHYROIDISM 12/20/2009    History   Social History  . Marital Status: Married    Spouse Name: N/A    Number of Children: N/A  . 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 Smoking status: Never Smoker   . Smokeless tobacco: Not on file  . Alcohol Use: No  . Drug Use: No  . Sexual Activity: Not on file   Other Topics Concern  . Not on file   Social History Narrative   Regular exercise: yes          Past Surgical History  Procedure Laterality Date  . Cholecystectomy  1990  . Breast surgery  2011    left breast biposy  . Appendectomy  2004  . Tonsillectomy and adenoidectomy  2000  . Abdominal hysterectomy  2004    also had bladder sling  . Thyroidectomy  2000  . Colonoscopy w/ polypectomy  06/2009    Family History    Problem Relation Age of Onset  . Alcohol abuse Mother   . Arthritis Mother   . Cancer Mother     ovarian, breast  . Hypertension Mother   . Bipolar disorder Mother   . Alcohol abuse Father   . Cancer Father     lung  . Hyperlipidemia Father   . Kidney disease Father   . Diabetes Father   . Arthritis Maternal Grandmother   . Diabetes Paternal Grandmother     Allergies  Allergen Reactions  . Aspirin     REACTION: stomach sensitivity  . Codeine     REACTION: rash, dyspnea  . Erythromycin     REACTION: rash, dyspnea  . Iodine     REACTION: heart stopped  . Latex     REACTION: Swelling  . Lidocaine     REACTION: throat swelling  . Penicillins     REACTION: rash  . Pentazocine Lactate     REACTION: hives, difficulty swallowing  . Sulfonamide Derivatives     REACTION: rash  . Symbicort [Budesonide-Formoterol Fumarate] Rash    Current Outpatient Prescriptions on File Prior to  Visit  Medication Sig Dispense Refill  . albuterol (PROVENTIL HFA;VENTOLIN HFA) 108 (90 BASE) MCG/ACT inhaler Inhale 2 puffs into the lungs every 6 (six) hours as needed for wheezing or shortness of breath. Please dispense Proair  8.5 g  5  . albuterol (PROVENTIL) (2.5 MG/3ML) 0.083% nebulizer solution Take 3 mLs (2.5 mg total) by nebulization every 6 (six) hours as needed.  75 mL  2  . benzonatate (TESSALON) 200 MG capsule Take 1 capsule (200 mg total) by mouth 3 (three) times daily as needed for cough.  30 capsule  1  . Calcium Carbonate (CALCIUM 500 PO) Take 1 tablet by mouth 2 (two) times daily.        . cetirizine (ZYRTEC) 10 MG tablet Take 10 mg by mouth daily.      . colchicine 0.6 MG tablet 2 tabs by mouth now followed by 1 tablet one hour later. May repeat tomorrow if needed.  6 tablet  0  . diphenhydrAMINE (BENADRYL) 25 MG tablet Take 25 mg by mouth at bedtime as needed.      . DULoxetine (CYMBALTA) 60 MG capsule Take 1 capsule (60 mg total) by mouth daily.  90 capsule  1  . EPINEPHrine 0.3  mg/0.3 mL IJ SOAJ injection Inject 0.3 mLs (0.3 mg total) into the muscle once.  2 Device  0  . Ergocalciferol (VITAMIN D2) 400 UNITS TABS Take 1 tablet by mouth daily.      Marland Kitchen estrogens, conjugated, (PREMARIN) 0.625 MG tablet Take 1 tablet (0.625 mg total) by mouth daily.  30 tablet  0  . Fluticasone-Salmeterol (ADVAIR DISKUS) 250-50 MCG/DOSE AEPB Inhale 1 puff into the lungs 2 (two) times daily.  180 each  1  . levothyroxine (SYNTHROID, LEVOTHROID) 150 MCG tablet Take 1 tablet (150 mcg total) by mouth daily.  30 tablet  1  . meloxicam (MOBIC) 7.5 MG tablet TAKE 1 TABLET DAILY  90 tablet  0  . Multiple Vitamin (MULTIVITAMIN) tablet Take 1 tablet by mouth daily.        . Olopatadine HCl 0.6 % SOLN 2 sprays each nostril twice daily  1 Bottle  2  . Omega-3 Fatty Acids (FISH OIL) 1200 MG CAPS Take 1 capsule by mouth daily.        Marland Kitchen omeprazole (PRILOSEC) 40 MG capsule TAKE ONE CAPSULE BY MOUTH EVERY DAY  90 capsule  3  . oxybutynin (DITROPAN-XL) 5 MG 24 hr tablet Take 5 mg by mouth daily.      . simvastatin (ZOCOR) 40 MG tablet TAKE 1 TABLET (40MG ) BY MOUTH DAILY  90 tablet  1   No current facility-administered medications on file prior to visit.    BP 126/90  Pulse 69  Temp(Src) 98.1 F (36.7 C) (Oral)  Resp 18  Ht 5\' 7"  (1.702 m)  Wt 264 lb (119.75 kg)  BMI 41.34 kg/m2  SpO2 99%   Review of Systems  Constitutional: Negative for fever, chills and fatigue.  HENT: Positive for dental problem and sinus pressure. Negative for congestion, drooling, hearing loss, nosebleeds, rhinorrhea, sneezing, sore throat, tinnitus and trouble swallowing.        Maxillary sinus pressure residual post allergy symptoms flare. Also swelling and redness of palate region behind upper incisors.  Respiratory: Negative for cough and wheezing.   Cardiovascular: Negative for chest pain and palpitations.  Neurological: Negative.        None reported.       Objective:   Physical Exam General  Mental  Status -  Alert. General Appearance - Well groomed. Not in acute distress.  Skin Rashes- No Rashes.  HEENT Head- Normal. Ear Auditory Canal - Left- Normal. Right - Normal.Tympanic Membrane- Left- Normal. Right- Normal. Eye Sclera/Conjunctiva- Left- Normal. Right- Normal. Nose & Sinuses Nasal Mucosa- Left- Not Boggy or Congested. Right- Not Boggy or Congested. Bilateral maxillary sinus pressure. Mouth & Throat Lips: Upper Lip- Normal: no dryness, cracking, pallor, cyanosis, or vesicular eruption. Lower Lip-Normal: no dryness, cracking, pallor, cyanosis or vesicular eruption. Buccal Mucosa- Bilateral- No Aphthous ulcers. Oropharynx- No Discharge or Erythema. Tonsils: Characteristics- Bilateral- No Erythema or Congestion. Size/Enlargement- Bilateral- No enlargement. Discharge- bilateral-None. Upper palate behind incisors mild red and inflamed with swelling.  Neck Neck- Supple. No Masses.   Chest and Lung Exam Auscultation: Breath Sounds:-Normal.  Cardiovascular Auscultation:Rythm- Regular.  Murmurs & Other Heart Sounds:Ausculatation of the heart reveal- No Murmurs.  Lymphatic Head & Neck General Head & Neck Lymphatics: Bilateral: Description- No Localized lymphadenopathy.        Assessment & Plan:

## 2013-12-18 ENCOUNTER — Encounter: Payer: Self-pay | Admitting: Physician Assistant

## 2013-12-18 ENCOUNTER — Ambulatory Visit (INDEPENDENT_AMBULATORY_CARE_PROVIDER_SITE_OTHER): Payer: BC Managed Care – PPO | Admitting: Physician Assistant

## 2013-12-18 VITALS — BP 122/92 | HR 87 | Temp 97.7°F | Resp 16 | Ht 67.0 in | Wt 264.5 lb

## 2013-12-18 DIAGNOSIS — N6320 Unspecified lump in the left breast, unspecified quadrant: Secondary | ICD-10-CM

## 2013-12-18 DIAGNOSIS — N63 Unspecified lump in unspecified breast: Secondary | ICD-10-CM

## 2013-12-18 NOTE — Patient Instructions (Signed)
You will be contacted by the breast center for an appointment for a diagnostic mammogram to further assess your breast lump.  I will contact you with your results and we will proceed with further imaging if needed.  You will be in excellent hands at the [facility].  They have a wonderful and highly-trained staff.  Please do not hesitate to call me if you have any other concerns.

## 2013-12-18 NOTE — Progress Notes (Signed)
Pre visit review using our clinic review tool, if applicable. No additional management support is needed unless otherwise documented below in the visit note/SLS  

## 2013-12-19 ENCOUNTER — Ambulatory Visit: Payer: BC Managed Care – PPO | Admitting: Physician Assistant

## 2013-12-19 ENCOUNTER — Other Ambulatory Visit: Payer: Self-pay | Admitting: Physician Assistant

## 2013-12-19 DIAGNOSIS — N6320 Unspecified lump in the left breast, unspecified quadrant: Secondary | ICD-10-CM | POA: Insufficient documentation

## 2013-12-19 NOTE — Progress Notes (Signed)
Patient presents to clinic today c/o firm, non-tender lump of her left breast first noticed 1.5 weeks ago during her monthly breast self-examination.  Denies history of breast lump.  Has significant family history of breast cancer.  Last mammogram 2+ years ago.  Patient denies change to skin or nipple.  Denies discharge.  Denies tenderness of lump.  Denies lump elsewhere.  Past Medical History  Diagnosis Date  . Arthritis   . Asthma   . GERD (gastroesophageal reflux disease)   . Depression   . Allergy   . Hypertension   . Hyperlipidemia   . Diverticulitis 06/2009  . Migraine   . History of esophageal ulcer   . Colon polyps   . Cancer 2000    thyroid  . Urinary incontinence   . Hx: UTI (urinary tract infection)   . HYPOTHYROIDISM 12/20/2009    Current Outpatient Prescriptions on File Prior to Visit  Medication Sig Dispense Refill  . albuterol (PROVENTIL HFA;VENTOLIN HFA) 108 (90 BASE) MCG/ACT inhaler Inhale 2 puffs into the lungs every 6 (six) hours as needed for wheezing or shortness of breath. Please dispense Proair  8.5 g  5  . albuterol (PROVENTIL) (2.5 MG/3ML) 0.083% nebulizer solution Take 3 mLs (2.5 mg total) by nebulization every 6 (six) hours as needed.  75 mL  2  . benzonatate (TESSALON) 200 MG capsule Take 1 capsule (200 mg total) by mouth 3 (three) times daily as needed for cough.  30 capsule  1  . Calcium Carbonate (CALCIUM 500 PO) Take 1 tablet by mouth 2 (two) times daily.        . cetirizine (ZYRTEC) 10 MG tablet Take 10 mg by mouth daily.      . colchicine 0.6 MG tablet 2 tabs by mouth now followed by 1 tablet one hour later. May repeat tomorrow if needed.  6 tablet  0  . diphenhydrAMINE (BENADRYL) 25 MG tablet Take 25 mg by mouth at bedtime as needed.      . DULoxetine (CYMBALTA) 60 MG capsule Take 1 capsule (60 mg total) by mouth daily.  90 capsule  1  . EPINEPHrine 0.3 mg/0.3 mL IJ SOAJ injection Inject 0.3 mLs (0.3 mg total) into the muscle once.  2 Device  0  .  Ergocalciferol (VITAMIN D2) 400 UNITS TABS Take 1 tablet by mouth daily.      Marland Kitchen estrogens, conjugated, (PREMARIN) 0.625 MG tablet Take 1 tablet (0.625 mg total) by mouth daily.  30 tablet  0  . Fluticasone-Salmeterol (ADVAIR DISKUS) 250-50 MCG/DOSE AEPB Inhale 1 puff into the lungs 2 (two) times daily.  180 each  1  . levothyroxine (SYNTHROID, LEVOTHROID) 150 MCG tablet Take 1 tablet (150 mcg total) by mouth daily.  30 tablet  1  . meloxicam (MOBIC) 7.5 MG tablet TAKE 1 TABLET DAILY  90 tablet  0  . Multiple Vitamin (MULTIVITAMIN) tablet Take 1 tablet by mouth daily.        . Olopatadine HCl 0.6 % SOLN 2 sprays each nostril twice daily  1 Bottle  2  . Omega-3 Fatty Acids (FISH OIL) 1200 MG CAPS Take 1 capsule by mouth daily.        Marland Kitchen omeprazole (PRILOSEC) 40 MG capsule TAKE ONE CAPSULE BY MOUTH EVERY DAY  90 capsule  3  . oxybutynin (DITROPAN-XL) 5 MG 24 hr tablet Take 5 mg by mouth daily.      . simvastatin (ZOCOR) 40 MG tablet TAKE 1 TABLET (40MG ) BY MOUTH DAILY  90 tablet  1   No current facility-administered medications on file prior to visit.    Allergies  Allergen Reactions  . Aspirin     REACTION: stomach sensitivity  . Codeine     REACTION: rash, dyspnea  . Erythromycin     REACTION: rash, dyspnea  . Iodine     REACTION: heart stopped  . Latex     REACTION: Swelling  . Lidocaine     REACTION: throat swelling  . Penicillins     REACTION: rash  . Pentazocine Lactate     REACTION: hives, difficulty swallowing  . Sulfonamide Derivatives     REACTION: rash  . Symbicort [Budesonide-Formoterol Fumarate] Rash    Family History  Problem Relation Age of Onset  . Alcohol abuse Mother   . Arthritis Mother   . Cancer Mother     ovarian, breast  . Hypertension Mother   . Bipolar disorder Mother   . Alcohol abuse Father   . Cancer Father     lung  . Hyperlipidemia Father   . Kidney disease Father   . Diabetes Father   . Arthritis Maternal Grandmother   . Diabetes  Paternal Grandmother     History   Social History  . Marital Status: Married    Spouse Name: N/A    Number of Children: N/A  . 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 Smoking status: Never Smoker   . Smokeless tobacco: None  . Alcohol Use: No  . Drug Use: No  . Sexual Activity: None   Other Topics Concern  . None   Social History Narrative   Regular exercise: yes         Review of Systems - See HPI.  All other ROS are negative.  BP 122/92  Pulse 87  Temp(Src) 97.7 F (36.5 C) (Oral)  Resp 16  Ht 5\' 7"  (1.702 m)  Wt 264 lb 8 oz (119.976 kg)  BMI 41.42 kg/m2  SpO2 96%  Physical Exam  Vitals reviewed. Constitutional: She is oriented to person, place, and time and well-developed, well-nourished, and in no distress.  HENT:  Head: Normocephalic and atraumatic.  Cardiovascular: Normal rate, regular rhythm, normal heart sounds and intact distal pulses.   Pulmonary/Chest: Effort normal and breath sounds normal. No respiratory distress. She has no wheezes. She has no rales. She exhibits no tenderness.    Lymphadenopathy:    She has no cervical adenopathy.    She has no axillary adenopathy.       Right: No supraclavicular and no epitrochlear adenopathy present.       Left: No supraclavicular and no epitrochlear adenopathy present.  Neurological: She is alert and oriented to person, place, and time.  Skin: Skin is warm and dry. No rash noted.   Recent Results (from the past 2160 hour(s))  TSH     Status: Abnormal   Collection Time    09/24/13 11:22 AM      Result Value Ref Range   TSH 0.084 (*) 0.350 - 4.500 uIU/mL   Assessment/Plan: Lump of breast, left RUO quadrant.  Concern for malignancy.  + Family hx of breast cancer. [facility].  Patient to call if new symptoms were to develop.

## 2013-12-19 NOTE — Assessment & Plan Note (Signed)
RUO quadrant.  Concern for malignancy.  + Family hx of breast cancer. Will send to [facility] for [facility] Mammogram.  Patient to call if new symptoms were to develop.

## 2013-12-22 ENCOUNTER — Ambulatory Visit: Payer: BC Managed Care – PPO

## 2013-12-22 ENCOUNTER — Other Ambulatory Visit: Payer: Self-pay | Admitting: Physician Assistant

## 2013-12-22 ENCOUNTER — Ambulatory Visit
Admission: RE | Admit: 2013-12-22 | Discharge: 2013-12-22 | Disposition: A | Discharge status: Home / Self Care | Payer: BC Managed Care – PPO | Source: Ambulatory Visit | Attending: Physician Assistant | Admitting: Physician Assistant

## 2013-12-22 ENCOUNTER — Other Ambulatory Visit: Payer: Self-pay

## 2013-12-22 DIAGNOSIS — N6320 Unspecified lump in the left breast, unspecified quadrant: Secondary | ICD-10-CM

## 2013-12-23 ENCOUNTER — Encounter: Payer: Self-pay | Admitting: Physician Assistant

## 2013-12-23 ENCOUNTER — Other Ambulatory Visit: Payer: Self-pay | Admitting: Physician Assistant

## 2013-12-23 DIAGNOSIS — C50919 Malignant neoplasm of unspecified site of unspecified female breast: Secondary | ICD-10-CM

## 2013-12-23 DIAGNOSIS — D0512 Intraductal carcinoma in situ of left breast: Secondary | ICD-10-CM | POA: Insufficient documentation

## 2013-12-24 ENCOUNTER — Telehealth: Payer: Self-pay | Admitting: Family

## 2013-12-24 NOTE — Telephone Encounter (Signed)
See my chart message

## 2013-12-25 ENCOUNTER — Telehealth: Payer: Self-pay | Admitting: Physician Assistant

## 2013-12-25 NOTE — Telephone Encounter (Signed)
Message copied by Raiford Noble on Thu Dec 25, 2013  3:12 PM ------      Message from: Elveria Royals      Created: Thu Dec 25, 2013  2:52 PM                   ----- Message -----         From: Irven Baltimore         Sent: 12/25/2013   1:18 PM           To: Elveria Royals            Patient called back stating that she is allergic to the dye that will be given during her MRI ------

## 2013-12-25 NOTE — Telephone Encounter (Signed)
MRI should be using Gladolium contrast and not an iodinated contrast, so no reaction should occur.  I will call the radiologist to discus this with them personally and will give her a call back.  Please inform patient of this.  I will try to call her back by tomorrow morning at the latest.

## 2013-12-26 ENCOUNTER — Telehealth: Payer: Self-pay | Admitting: *Deleted

## 2013-12-26 DIAGNOSIS — C50412 Malignant neoplasm of upper-outer quadrant of left female breast: Secondary | ICD-10-CM

## 2013-12-26 MED ORDER — METHYLPREDNISOLONE (PAK) 4 MG PO TABS: ORAL_TABLET | ORAL | Status: DC

## 2013-12-26 MED ORDER — DIPHENHYDRAMINE HCL 25 MG PO TABS: ORAL_TABLET | ORAL | Status: DC

## 2013-12-26 NOTE — Telephone Encounter (Signed)
Rx sent with dosing instructions.

## 2013-12-26 NOTE — Telephone Encounter (Signed)
Confirmed [facility] for 12/31/13 at 12N .  Instructions and contact information given.

## 2013-12-26 NOTE — Telephone Encounter (Signed)
Patient informed, understood & agreed; please send Medrol & Benadryl to pharmacy/SLS

## 2013-12-26 NOTE — Telephone Encounter (Signed)
Spoke with the Radiologist (Dr. Autumn Patty) who ordered the test and who has been following her care.  He states that the likelihood of a cross-reaction to the MRI contrast (Gadoliniuim) giving her history of iodine allergy is exceedingly rare.  They do not typically premedicate patient's for this, nor does he have concerns about her having the study with contrast.   He does however state that if she is nervous about having the contrast, it would not be harmful to the patient to give her an Rx for a Medrol dose pack and Benadryl prior.  I am fine with giving her this medication if she wishes, but she should not be concerned about having the study.  She definitely needs this imaging.

## 2013-12-30 ENCOUNTER — Ambulatory Visit
Admission: RE | Admit: 2013-12-30 | Discharge: 2013-12-30 | Disposition: A | Discharge status: Home / Self Care | Payer: BC Managed Care – PPO | Source: Ambulatory Visit | Attending: Physician Assistant | Admitting: Physician Assistant

## 2013-12-30 DIAGNOSIS — C50919 Malignant neoplasm of unspecified site of unspecified female breast: Secondary | ICD-10-CM

## 2013-12-30 MED ORDER — GADOBENATE DIMEGLUMINE 529 MG/ML IV SOLN
20.0000 mL | Freq: Once | INTRAVENOUS | Status: AC | PRN
  Administered 2013-12-30: 20 mL via INTRAVENOUS

## 2013-12-31 ENCOUNTER — Encounter: Payer: Self-pay | Admitting: Hematology and Oncology

## 2013-12-31 ENCOUNTER — Ambulatory Visit (HOSPITAL_BASED_OUTPATIENT_CLINIC_OR_DEPARTMENT_OTHER): Payer: BC Managed Care – PPO | Admitting: Hematology and Oncology

## 2013-12-31 ENCOUNTER — Other Ambulatory Visit (HOSPITAL_BASED_OUTPATIENT_CLINIC_OR_DEPARTMENT_OTHER): Payer: BC Managed Care – PPO

## 2013-12-31 ENCOUNTER — Ambulatory Visit: Payer: BC Managed Care – PPO

## 2013-12-31 ENCOUNTER — Other Ambulatory Visit: Payer: Self-pay | Admitting: Physician Assistant

## 2013-12-31 ENCOUNTER — Ambulatory Visit (HOSPITAL_BASED_OUTPATIENT_CLINIC_OR_DEPARTMENT_OTHER): Payer: BC Managed Care – PPO | Admitting: General Surgery

## 2013-12-31 ENCOUNTER — Ambulatory Visit: Payer: BC Managed Care – PPO | Attending: General Surgery | Admitting: Physical Therapy

## 2013-12-31 ENCOUNTER — Ambulatory Visit
Admission: RE | Admit: 2013-12-31 | Discharge: 2013-12-31 | Disposition: A | Discharge status: Home / Self Care | Payer: BC Managed Care – PPO | Source: Ambulatory Visit | Attending: Radiation Oncology | Admitting: Radiation Oncology

## 2013-12-31 VITALS — BP 143/74 | HR 90 | Temp 97.8°F | Resp 20 | Ht 67.0 in | Wt 263.6 lb

## 2013-12-31 DIAGNOSIS — C50919 Malignant neoplasm of unspecified site of unspecified female breast: Secondary | ICD-10-CM | POA: Insufficient documentation

## 2013-12-31 DIAGNOSIS — Z170 Estrogen receptor positive status [ER+]: Secondary | ICD-10-CM | POA: Diagnosis not present

## 2013-12-31 DIAGNOSIS — N951 Menopausal and female climacteric states: Secondary | ICD-10-CM

## 2013-12-31 DIAGNOSIS — C50419 Malignant neoplasm of upper-outer quadrant of unspecified female breast: Secondary | ICD-10-CM

## 2013-12-31 DIAGNOSIS — C50412 Malignant neoplasm of upper-outer quadrant of left female breast: Secondary | ICD-10-CM

## 2013-12-31 DIAGNOSIS — Z5189 Encounter for other specified aftercare: Secondary | ICD-10-CM | POA: Diagnosis present

## 2013-12-31 DIAGNOSIS — R928 Other abnormal and inconclusive findings on diagnostic imaging of breast: Secondary | ICD-10-CM

## 2013-12-31 DIAGNOSIS — R293 Abnormal posture: Secondary | ICD-10-CM | POA: Diagnosis not present

## 2013-12-31 LAB — CBC WITH DIFFERENTIAL/PLATELET
BASO%: 0.5 % (ref 0.0–2.0)
Basophils Absolute: 0.1 10*3/uL (ref 0.0–0.1)
EOS ABS: 0.2 10*3/uL (ref 0.0–0.5)
EOS%: 1.5 % (ref 0.0–7.0)
HCT: 46.5 % (ref 34.8–46.6)
HGB: 14.7 g/dL (ref 11.6–15.9)
LYMPH#: 3.2 10*3/uL (ref 0.9–3.3)
LYMPH%: 29.5 % (ref 14.0–49.7)
MCH: 24.6 pg — ABNORMAL LOW (ref 25.1–34.0)
MCHC: 31.6 g/dL (ref 31.5–36.0)
MCV: 77.7 fL — ABNORMAL LOW (ref 79.5–101.0)
MONO#: 0.7 10*3/uL (ref 0.1–0.9)
MONO%: 6.6 % (ref 0.0–14.0)
NEUT#: 6.6 10*3/uL — ABNORMAL HIGH (ref 1.5–6.5)
NEUT%: 61.9 % (ref 38.4–76.8)
PLATELETS: 308 10*3/uL (ref 145–400)
RBC: 5.98 10*6/uL — ABNORMAL HIGH (ref 3.70–5.45)
RDW: 15.3 % — ABNORMAL HIGH (ref 11.2–14.5)
WBC: 10.8 10*3/uL — AB (ref 3.9–10.3)

## 2013-12-31 LAB — COMPREHENSIVE METABOLIC PANEL (CC13)
ALBUMIN: 3.5 g/dL (ref 3.5–5.0)
ALT: 30 U/L (ref 0–55)
ANION GAP: 12 meq/L — AB (ref 3–11)
AST: 25 U/L (ref 5–34)
Alkaline Phosphatase: 64 U/L (ref 40–150)
BUN: 13.6 mg/dL (ref 7.0–26.0)
CO2: 25 meq/L (ref 22–29)
Calcium: 9.2 mg/dL (ref 8.4–10.4)
Chloride: 104 mEq/L (ref 98–109)
Creatinine: 0.9 mg/dL (ref 0.6–1.1)
Glucose: 117 mg/dl (ref 70–140)
POTASSIUM: 3.6 meq/L (ref 3.5–5.1)
SODIUM: 141 meq/L (ref 136–145)
TOTAL PROTEIN: 7.1 g/dL (ref 6.4–8.3)
Total Bilirubin: 0.27 mg/dL (ref 0.20–1.20)

## 2013-12-31 NOTE — Assessment & Plan Note (Signed)
Left breast invasive ductal carcinoma size based on MRI is 5.5 cm and based on mammogram ultrasound was 1.8 cm patient had a left axillary lymph node visible on ultrasound which was biopsy proven to be negative the tumor was ER 99% positive PR 100% positive HER-2/neu negative ratio 0.74 mg copy #1.75 Ki-67 is 11% grade 2  Clinical stage: T3, N0, M0 stage II  We discussed her case at our multidisciplinary breast clinic and the consensus opinion was to perform surgery up front. The type of surgery would depend on biopsy of the anterior satellite nodule in the nodule is benign to me undergo lumpectomy otherwise she will need mastectomy.  I would like to see her back after surgery to discuss the results of the pathology I have requested the surgical pathology to be sent for Oncotype DX testing to determine whether she would benefit from adjuvant chemotherapy I discussed with her the sequence of treatments would be after surgery chemotherapy followed by evaluation for radiation followed by antiestrogen therapy.  Given her personal history of? Ovarian cancer and family history of ovarian cancer we recommended genetic testing.  I discussed in great length with Oncotype DX means and the outcome of the testing all in good health determine the need for further chemotherapy.

## 2013-12-31 NOTE — Progress Notes (Signed)
Checked in new patient with no financial issues. She has appt card and has not been out of the country.

## 2013-12-31 NOTE — Progress Notes (Signed)
Chief Complaint: New diagnosis of breast cancer  History:    Michelle Kennedy is a 58 y.o. postmenopausal female referred by Dr. Lajean Manes  for evaluation of recently diagnosed carcinoma of the left breast. She recently Noted a lump in her left breast approximately 6 weeks ago. She saw her primary physician and was referred to the breast center for evaluation.  Subsequent imaging included diagnostic mamogram showing A spiculated mass in the upper-outer left breast corresponding to the palpable abnormality and ultrasound showing A hypoechoic irregular mass at the 2:00 position of the left breast 10 cm from the nipple measuring 18 x 14 x 16 mm..   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 She is seen now in Breast multidisciplinary clinic for initial treatment planning.  She has experienced a breast lump on exam and No other symptoms such as pain or nipple discharge or skin changes.  She does have a personal history of any previous breast problems With a benign cyst being removed some years ago. Subsequent bilateral breast MRI has been performed. Noted in the left upper outer quadrant is an area of enhancement consistent with the known primary tumor, but also a clump nodular area of enhancement at the anterior aspect measuring in total of 25.5 cm anterior to posterior. In addition to this he has a 6 mm abnormal area masslike enhancement another 3 cm anterior to this more toward the sub-areolar area of some concern as well.  Findings at that time were the following:  Tumor size: 5.5 cm  Tumor grade: 2  Estrogen Receptor: positive Progesterone Receptor: positive  Her-2 neu: negative  Lymph node status: negative   Past Medical History  Diagnosis Date  . Arthritis   . Asthma   . GERD (gastroesophageal reflux disease)   . Depression   . Allergy   . Hypertension   . Hyperlipidemia   . Diverticulitis 06/2009  .  Migraine   . History of esophageal ulcer   . Colon polyps   . Cancer 2000    thyroid  . Urinary incontinence   . Hx: UTI (urinary tract infection)   . HYPOTHYROIDISM 12/20/2009  . Anxiety   . Hot flashes     Past Surgical History  Procedure Laterality Date  . Cholecystectomy  1990  . Breast surgery  2011    left breast biposy  . Appendectomy  2004  . Tonsillectomy and adenoidectomy  2000  . Abdominal hysterectomy  2004    also had bladder sling  . Thyroidectomy  2000  . Colonoscopy w/ polypectomy  06/2009  . Right knee    . Left knee    . Rt ankle    . Back surgery    . Rt wrist      Current Outpatient Prescriptions  Medication Sig Dispense Refill  . albuterol (PROVENTIL HFA;VENTOLIN HFA) 108 (90 BASE) MCG/ACT inhaler Inhale 2 puffs into the lungs every 6 (six) hours as needed for wheezing or shortness of breath. Please dispense Proair  8.5 g  5  . benzonatate (TESSALON) 200 MG capsule Take 1 capsule (200 mg total) by mouth 3 (three) times daily as needed for cough.  30 capsule  1  . Calcium Carbonate (CALCIUM 500 PO) Take 1 tablet by mouth 2 (two) times daily.        . cetirizine (ZYRTEC) 10 MG tablet Take 10 mg by mouth daily.      . diphenhydrAMINE (BENADRYL)  25 MG tablet Take 1 tablet an hour before MRI.  Then take 1 tablet at bedtime as needed, per your usual regimen.  30 tablet  0  . DULoxetine (CYMBALTA) 60 MG capsule Take 1 capsule (60 mg total) by mouth daily.  90 capsule  1  . EPINEPHrine 0.3 mg/0.3 mL IJ SOAJ injection Inject 0.3 mLs (0.3 mg total) into the muscle once.  2 Device  0  . Ergocalciferol (VITAMIN D2) 400 UNITS TABS Take 1 tablet by mouth daily.      Marland Kitchen estrogens, conjugated, (PREMARIN) 0.625 MG tablet Take 1 tablet (0.625 mg total) by mouth daily.  30 tablet  0  . Fluticasone-Salmeterol (ADVAIR DISKUS) 250-50 MCG/DOSE AEPB Inhale 1 puff into the lungs 2 (two) times daily.  180 each  1  . levothyroxine (SYNTHROID, LEVOTHROID) 150 MCG tablet Take 1 tablet  (150 mcg total) by mouth daily.  30 tablet  1  . meloxicam (MOBIC) 7.5 MG tablet TAKE 1 TABLET DAILY  90 tablet  0  . Multiple Vitamin (MULTIVITAMIN) tablet Take 1 tablet by mouth daily.        . Omega-3 Fatty Acids (FISH OIL) 1200 MG CAPS Take 1 capsule by mouth daily.        Marland Kitchen omeprazole (PRILOSEC) 40 MG capsule TAKE ONE CAPSULE BY MOUTH EVERY DAY  90 capsule  3  . oxybutynin (DITROPAN-XL) 5 MG 24 hr tablet Take 5 mg by mouth daily.      . simvastatin (ZOCOR) 40 MG tablet TAKE 1 TABLET (40MG) BY MOUTH DAILY  90 tablet  1   No current facility-administered medications for this visit.    Family History  Problem Relation Age of Onset  . Alcohol abuse Mother   . Arthritis Mother   . Cancer Mother     ovarian, breast  . Hypertension Mother   . Bipolar disorder Mother   . Alcohol abuse Father   . Cancer Father     lung  . Hyperlipidemia Father   . Kidney disease Father   . Diabetes Father   . Arthritis Maternal Grandmother   . Diabetes Paternal Grandmother   . Cancer Sister     History   Social History  . Marital Status: Married    Spouse Name: N/A    Number of Children: N/A  . 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 Smoking status: Never Smoker   . Smokeless tobacco: Not on file  . Alcohol Use: No  . Drug Use: No  . Sexual Activity: Not on file   Other Topics Concern  . Not on file   Social History Narrative   Regular exercise: yes           Review of Systems Constitutional: positive for fatigue Respiratory: negative Cardiovascular: negative Gastrointestinal: negative Neurological: negative Behavioral/Psych: positive for anxiety Endocrine: positive for thyroid replacement post thyroidectomy Allergic/Immunologic: positive for multiple allergies particularly to IV agents     Objective:  There were no vitals taken for this visit.  General: Alert, Moderately obese Caucasian female, in no distress Skin:  Warm and dry without rash or infection. HEENT: No palpable masses or thyromegaly. Sclera nonicteric. Pupils equal round and reactive. Oropharynx clear. Breasts: Somewhat subtle mass in the upper-outer quadrant of the left breast. No skin changes or nipple retraction. No palpable axillary lymph nodes Lymph nodes: No cervical, supraclavicular, or inguinal nodes palpable. Lungs: Breath sounds clear and equal  without increased work of breathing Cardiovascular: Regular rate and rhythm without murmur. No JVD or edema. Peripheral pulses intact. Abdomen: Nondistended. Soft and nontender. No masses palpable. No organomegaly. No palpable hernias. Extremities: No edema or joint swelling or deformity. No chronic venous stasis changes. Neurologic: Alert and fully oriented. Gait normal.   Laboratory data:  CBC:  Lab Results  Component Value Date   WBC 10.8* 12/31/2013   WBC 6.1 12/21/2009   RBC 5.98* 12/31/2013   RBC 5.39* 12/21/2009   HGB 14.7 12/31/2013   HGB 13.8 12/21/2009   HCT 46.5 12/31/2013   HCT 43.3 12/21/2009   PLT 308 12/31/2013   PLT 275 12/21/2009  ]  [facility] Labs:  Lab Results  Component Value Date   NA 141 12/31/2013   NA 140 05/31/2011   K 3.6 12/31/2013   K 4.6 05/31/2011   CL 106 05/31/2011   CO2 25 12/31/2013   CO2 24 05/31/2011   BUN 13.6 12/31/2013   BUN 17 05/31/2011   CREATININE 0.9 12/31/2013   CREATININE 0.83 05/31/2011   CREATININE 0.73 12/21/2009   CALCIUM 9.2 12/31/2013   CALCIUM 9.4 05/31/2011   PROT 7.1 12/31/2013   PROT 6.4 08/11/2013   BILITOT 0.27 12/31/2013   BILITOT 0.4 08/11/2013   BILIDIR 0.1 08/11/2013   ALKPHOS 64 12/31/2013   ALKPHOS 52 08/11/2013   AST 25 12/31/2013   AST 19 08/11/2013   ALT 30 12/31/2013   ALT 24 08/11/2013     Assessment  58 y.o. female with a new diagnosis of cancer of the the left breast upper outer quadrant.  Clinical IIB, estrogen receptor positive, progesterone receptor positive, Her2/Neu protein/oncogene negative and Ki 67 - growth factor low. I discussed  with the patient and family members present today initial surgical treatment options. We discussed options of breast conservation with lumpectomy or total mastectomy and sentinal lymph node biopsy/dissection. Options for reconstruction were discussed. She would like to pursue breast conservation if feasible. We discussed the abnormality seen on MRI which if positive would encompass a significant area of breast tissue up to 8 cm and I believe would make breast conservation not her best option. We're therefore planning to proceed with MRI guided biopsy of the anterior nodular enhancement. If this were negative but I believe we could proceed with lumpectomy and axillary sentinel lymph node biopsy. If his second biopsy were positive for carcinoma probably she would be best served with a mastectomy with sentinel lymph node biopsy. She understands and is in agreement with this plan. She is interested in immediate reconstruction if at all possible so that if we do elect to proceed with mastectomy she will need to see plastic surgery. She does have a history of breast cancer her mother her young age as well as some questionable history of ovarian cancer and will undergo genetic testing. It is possible that the results of this testing could influence her surgical decision as well.  Plan Proceed with MRI guided biopsy of area of abnormality in the anterior left breast. If negative would consider breast conservation with sentinel lymph node biopsy or a positive would consider total mastectomy and sentinel lymph node biopsy with immediate reconstruction. We will need to wait for the results of her genetic testing prior to surgery as this could influence her decision as well. I will see her back in the office as the studies are completed  Edward Jolly MD, FACS  12/31/2013, 3:41 PM

## 2013-12-31 NOTE — Progress Notes (Signed)
[facility] NOTE  Patient Care Team: Debbrah Alar, NP as PCP - General (Internal Medicine) Edward Jolly, MD as Consulting Physician (General Surgery) Rulon Eisenmenger, MD as Consulting Physician (Hematology and Oncology) [name], MD as Attending Physician (Radiation Oncology)  CHIEF COMPLAINTS/PURPOSE OF CONSULTATION:  Newly diagnosed left breast cancer  HISTORY OF PRESENTING ILLNESS:  Michelle Kennedy 58 y.o. Caucasian female is here because of recent diagnosis of left breast cancer. Patient felt a lump in her left upper outer quadrant of the breast and underwent a mammogram which revealed a 1.8 x 1.4 x 1.6 cm mass in ultrasound there was also a left axillary lymph node measuring 3.6 mm she underwent biopsy on 12/22/2013 that revealed invasive ductal carcinoma with DCIS grade 2 ER/PR positive HER-2 negative Ki-67 11% she had MRI of the breasts that revealed in addition to this palpable abnormality abnormality was extending anteriorly to form a satellite nodule which measures 6 mm so the total overal Is an excellent for her mass size was 5.5 cm based on MRI. Patient is here today accompanied by her husband and she reports no major problems or concerns has mild discomfort from the recent breast biopsy.    I reviewed her records extensively and collaborated the history with the patient.  SUMMARY OF ONCOLOGIC HISTORY:   Breast cancer of upper-outer quadrant of left female breast   12/22/2013 Pathology Results 1. Breast, left, needle core biopsy, mass, 2 o'clock - INVASIVE DUCTAL CARCINOMA. - DUCTAL CARCINOMA IN SITU. - SEE COMMENT 2. Lymph node, needle/core biopsy, left axilla - THERE IS NO EVIDENCE OF CARCINOMA IN 1 OF 1 LYMPH NODE (0/1).   12/22/2013 Pathology Results Estrogen Receptor: 99%, POSITIVE, STRONG STAINING INTENSITY Progesterone Receptor: 100%, POSITIVE, STRONG STAINING INTENSITY Proliferation Marker Ki67: 11%   12/22/2013 Pathology Results HER-2/NEU BY  CISH - NO AMPLIFICATION OF HER-2 DETECTED. RESULT RATIO OF HER2: CEP 17 SIGNALS 0.74 AVERAGE HER2 COPY NUMBER PER CELL 1.75   12/22/2013 Mammogram In the left axilla there is a single lymph node that has a portion of its cortex which is mildly thickened measuring 3.6 mm.    12/22/2013 Mammogram There is a spiculated mass in the upper outer left breast corresponding to the palpable abnormality. It is new from the prior exam. There are no other masses or areas of architectural distortion. No suspicious calcifications.    12/26/2013 Initial Diagnosis Breast cancer of upper-outer quadrant of left female breast   12/30/2013 Breast MRI Dominant enhancing left upper outer quadrant irregular mass encompassing a confluent area of abnormal clumped nodular enhancement, 5.5 cm overall, corresponding to biopsy-proven invasive mammary carcinoma.      12/30/2013 Breast MRI Accounting for the presence of a moderate nodular background type enhancement pattern, there is a 6 mm masslike area of abnormal enhancement in the central left breast, for which MRI guided biopsy is recommended.    12/30/2013 Breast MRI Because of the clumped nodular component of the biopsy proven breast cancer, this could represent an area of segmental in situ malignancy.   No MRI evidence for malignancy in the right breast.     In terms of breast cancer risk profile:  She menarched at early age of 59  and went to menopause at age  58 and   She had  2 pregnancy, her first child was born at age  62   she  was treated with hormone replacement therapy premarin for 10 years and he.  She has and  a using the family history of Breast cancer  INTERVAL HISTORY: Currently, she is healing well after surgery apart from mild discomfort. She has no concern for local infection  MEDICAL HISTORY:  Past Medical History  Diagnosis Date  . Arthritis   . Asthma   . GERD (gastroesophageal reflux disease)   . Depression   . Allergy   . Hypertension   .  Hyperlipidemia   . Diverticulitis 06/2009  . Migraine   . History of esophageal ulcer   . Colon polyps   . Cancer 2000    thyroid  . Urinary incontinence   . Hx: UTI (urinary tract infection)   . HYPOTHYROIDISM 12/20/2009  . Anxiety   . Hot flashes   . Menopausal syndrome (hot flashes) 12/31/2013    SURGICAL HISTORY: Past Surgical History  Procedure Laterality Date  . Cholecystectomy  1990  . Breast surgery  2011    left breast biposy  . Appendectomy  2004  . Tonsillectomy and adenoidectomy  2000  . Abdominal hysterectomy  2004    also had bladder sling  . Thyroidectomy  2000  . Colonoscopy w/ polypectomy  06/2009  . Right knee    . Left knee    . Rt ankle    . Back surgery    . Rt wrist      SOCIAL HISTORY: History   Social History  . Marital Status: Married    Spouse Name: N/A    Number of Children: N/A  . 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 Smoking status: Never Smoker   . Smokeless tobacco: Not on file  . Alcohol Use: No  . Drug Use: No  . Sexual Activity: Not on file   Other Topics Concern  . Not on file   Social History Narrative   Regular exercise: yes          FAMILY HISTORY: Family History  Problem Relation Age of Onset  . Alcohol abuse Mother   . Arthritis Mother   . Cancer Mother     ovarian, breast  . Hypertension Mother   . Bipolar disorder Mother   . Alcohol abuse Father   . Cancer Father     lung  . Hyperlipidemia Father   . Kidney disease Father   . Diabetes Father   . Arthritis Maternal Grandmother   . Diabetes Paternal Grandmother   . Cancer Sister     ALLERGIES:  is allergic to aspirin; codeine; contrast media; erythromycin; iodine; latex; lidocaine; penicillins; pentazocine lactate; sulfonamide derivatives; and symbicort.  MEDICATIONS:  Current Outpatient Prescriptions  Medication Sig Dispense Refill  . albuterol (PROVENTIL HFA;VENTOLIN HFA) 108 (90 BASE)  MCG/ACT inhaler Inhale 2 puffs into the lungs every 6 (six) hours as needed for wheezing or shortness of breath. Please dispense Proair  8.5 g  5  . benzonatate (TESSALON) 200 MG capsule Take 1 capsule (200 mg total) by mouth 3 (three) times daily as needed for cough.  30 capsule  1  . Calcium Carbonate (CALCIUM 500 PO) Take 1 tablet by mouth 2 (two) times daily.        . cetirizine (ZYRTEC) 10 MG tablet Take 10 mg by mouth daily.      . diphenhydrAMINE (BENADRYL) 25 MG tablet Take 1 tablet an hour before MRI.  Then take 1 tablet at bedtime as needed, per your usual regimen.  30 tablet  0  . DULoxetine (  CYMBALTA) 60 MG capsule Take 1 capsule (60 mg total) by mouth daily.  90 capsule  1  . EPINEPHrine 0.3 mg/0.3 mL IJ SOAJ injection Inject 0.3 mLs (0.3 mg total) into the muscle once.  2 Device  0  . Ergocalciferol (VITAMIN D2) 400 UNITS TABS Take 1 tablet by mouth daily.      Marland Kitchen estrogens, conjugated, (PREMARIN) 0.625 MG tablet Take 1 tablet (0.625 mg total) by mouth daily.  30 tablet  0  . Fluticasone-Salmeterol (ADVAIR DISKUS) 250-50 MCG/DOSE AEPB Inhale 1 puff into the lungs 2 (two) times daily.  180 each  1  . levothyroxine (SYNTHROID, LEVOTHROID) 150 MCG tablet Take 1 tablet (150 mcg total) by mouth daily.  30 tablet  1  . meloxicam (MOBIC) 7.5 MG tablet TAKE 1 TABLET DAILY  90 tablet  0  . Multiple Vitamin (MULTIVITAMIN) tablet Take 1 tablet by mouth daily.        . Omega-3 Fatty Acids (FISH OIL) 1200 MG CAPS Take 1 capsule by mouth daily.        Marland Kitchen omeprazole (PRILOSEC) 40 MG capsule TAKE ONE CAPSULE BY MOUTH EVERY DAY  90 capsule  3  . oxybutynin (DITROPAN-XL) 5 MG 24 hr tablet Take 5 mg by mouth daily.      . simvastatin (ZOCOR) 40 MG tablet TAKE 1 TABLET (40MG) BY MOUTH DAILY  90 tablet  1   No current facility-administered medications for this visit.    REVIEW OF SYSTEMS:   Constitutional: Denies fevers, chills or abnormal night sweats Eyes: Denies blurriness of vision, double vision  or watery eyes Ears, nose, mouth, throat, and face: Denies mucositis or sore throat Respiratory: Denies cough, dyspnea or wheezes Cardiovascular: Denies palpitation, chest discomfort or lower extremity swelling Gastrointestinal:  Denies nausea, heartburn or change in bowel habits Skin: Denies abnormal skin rashes Lymphatics: Denies new lymphadenopathy or easy bruising Neurological:Denies numbness, tingling or new weaknesses Behavioral/Psych: Mood is stable, no new changes  All other systems were reviewed with the patient and are negative.  PHYSICAL EXAMINATION: ECOG PERFORMANCE STATUS: 0 - Asymptomatic  Filed Vitals:   12/31/13 1307  BP: 143/74  Pulse: 90  Temp: 97.8 F (36.6 C)  Resp: 20   Filed Weights   12/31/13 1307  Weight: 263 lb 9.6 oz (119.568 kg)    GENERAL:alert, no distress and comfortable SKIN: skin color, texture, turgor are normal, no rashes or significant lesions EYES: normal, conjunctiva are pink and non-injected, sclera clear OROPHARYNX:no exudate, no erythema and lips, buccal mucosa, and tongue normal  NECK: supple, thyroid normal size, non-tender, without nodularity LYMPH:  no palpable lymphadenopathy in the cervical, axillary or inguinal LUNGS: clear to auscultation and percussion with normal breathing effort HEART: regular rate & rhythm and no murmurs and no lower extremity edema ABDOMEN:abdomen soft, non-tender and normal bowel sounds Musculoskeletal:no cyanosis of digits and no clubbing  PSYCH: alert & oriented x 3 with fluent speech NEURO: no focal motor/sensory deficits Breast exam palpable nodule in the left breast upper-outer quadrant 4 cm in size movable no palpable lymphadenopathy no abnormalities on right breast exam.  LABORATORY DATA:  I have reviewed the data as listed Lab Results  Component Value Date   WBC 10.8* 12/31/2013   HGB 14.7 12/31/2013   HCT 46.5 12/31/2013   MCV 77.7* 12/31/2013   PLT 308 12/31/2013   Lab Results  Component  Value Date   NA 141 12/31/2013   K 3.6 12/31/2013   CL 106 05/31/2011  CO2 25 12/31/2013    RADIOGRAPHIC STUDIES: I have personally reviewed the radiological reports and agreed with the findings in the report.   ASSESSMENT AND PLAN:  Breast cancer of upper-outer quadrant of left female breast Left breast invasive ductal carcinoma size based on MRI is 5.5 cm and based on mammogram ultrasound was 1.8 cm patient had a left axillary lymph node visible on ultrasound which was biopsy proven to be negative the tumor was ER 99% positive PR 100% positive HER-2/neu negative ratio 0.74 mg copy #1.75 Ki-67 is 11% grade 2  Clinical stage: T3, N0, M0 stage II  We discussed her case at our multidisciplinary breast clinic and the consensus opinion was to perform surgery up front. The type of surgery would depend on biopsy of the anterior satellite nodule in the nodule is benign to me undergo lumpectomy otherwise she will need mastectomy.  I would like to see her back after surgery to discuss the results of the pathology I have requested the surgical pathology to be sent for Oncotype DX testing to determine whether she would benefit from adjuvant chemotherapy I discussed with her the sequence of treatments would be after surgery chemotherapy followed by evaluation for radiation followed by antiestrogen therapy.  Given her personal history of? Ovarian cancer and family history of ovarian cancer we recommended genetic testing.  I discussed in great length with Oncotype DX means and the outcome of the testing all in good health determine the need for further chemotherapy.  Menopausal syndrome (hot flashes) Patient has profound hot flashes for the past 10 years since she ovarian surgery and takes Premarin be discontinued Premarin therapy as of today prior to determine she had severe hot flashes that led to even nausea vomiting irritability. She is very anxious of discontinuing her but she knows she has to stop it I  discussed with her that we could put her on Effexor but eventually we decided to stop therapy and see how she does and if necessary we will start Effexor if her symptoms are severe.  All questions were answered. The patient knows to call the clinic with any problems, questions or concerns. I spent 40 minutes counseling the patient face to face. The total time spent in the appointment was 60 minutes and more than 50% was on counseling.     Rulon Eisenmenger, MD 12/31/2013 3:55 PM

## 2013-12-31 NOTE — Assessment & Plan Note (Signed)
Patient has profound hot flashes for the past 10 years since she ovarian surgery and takes Premarin be discontinued Premarin therapy as of today prior to determine she had severe hot flashes that led to even nausea vomiting irritability. She is very anxious of discontinuing her but she knows she has to stop it I discussed with her that we could put her on Effexor but eventually we decided to stop therapy and see how she does and if necessary we will start Effexor if her symptoms are severe.
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
## 2014-01-01 ENCOUNTER — Encounter: Payer: Self-pay | Admitting: Radiation Oncology

## 2014-01-02 ENCOUNTER — Other Ambulatory Visit: Payer: BC Managed Care – PPO

## 2014-01-02 ENCOUNTER — Encounter: Payer: Self-pay | Admitting: Genetic Counselor

## 2014-01-02 ENCOUNTER — Encounter: Payer: Self-pay | Admitting: General Practice

## 2014-01-02 ENCOUNTER — Ambulatory Visit (HOSPITAL_BASED_OUTPATIENT_CLINIC_OR_DEPARTMENT_OTHER): Payer: BC Managed Care – PPO | Admitting: Genetic Counselor

## 2014-01-02 DIAGNOSIS — Z808 Family history of malignant neoplasm of other organs or systems: Secondary | ICD-10-CM

## 2014-01-02 DIAGNOSIS — C50919 Malignant neoplasm of unspecified site of unspecified female breast: Secondary | ICD-10-CM

## 2014-01-02 DIAGNOSIS — Z85850 Personal history of malignant neoplasm of thyroid: Secondary | ICD-10-CM

## 2014-01-02 NOTE — Progress Notes (Signed)
Patient Name: Michelle Kennedy Patient Age: 58 y.o. Encounter Date: 01/02/2014  Referring Physician: Nicholas Lose, MD  Primary Care Provider: Nance Pear., NP   [name], a 58 y.o. female, is being seen at the [facility] due to a personal and family history of cancer.  She presents to clinic today to discuss the possibility of a hereditary predisposition to cancer and discuss whether genetic testing is warranted.  HISTORY OF PRESENT ILLNESS: Ms. 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 She stated that she will have another left breast biopsy next week of a suspicious area. Surgery is to be decided. The breast tumor was ER positive, PR positive, and HER2 negative.  Ms. Birkhead reports that in 2000, at age 20, she was diagnosed with thyroid cancer (path unavailable for review). She had a thyroidectomy. She states she lived in [place] a child during the time of nuclear testing and suspects she and her relatives were exposed to excess radiation.  In 2004, she had a TAH/BSO due to ovarian cysts and "abnormal cells in the uterus". She was placed on Premarin until her diagnosis of breast cancer. She reports that at age 18 she had a colonoscopy and that 2 polyps were removed (path unavailable). She has not had a repeat screen.  She reports that she has "flat warts" on the hand and feet. It is unclear what exactly these are histologically.  Past Medical History  Diagnosis Date  . Arthritis   . Asthma   . GERD (gastroesophageal reflux disease)   . Depression   . Allergy   . Hypertension   . Hyperlipidemia   . Diverticulitis 06/2009  . Migraine   . History of esophageal ulcer   . Colon polyps   . Cancer 2000    thyroid  . Urinary incontinence   . Hx: UTI (urinary tract infection)   . HYPOTHYROIDISM 12/20/2009  . Anxiety   . Hot flashes   . Menopausal syndrome (hot flashes) 12/31/2013  . Malignant neoplasm of breast  (female), unspecified site     Past Surgical History  Procedure Laterality Date  . Cholecystectomy  1990  . Breast surgery  2011    left breast biposy  . Appendectomy  2004  . Tonsillectomy and adenoidectomy  2000  . Abdominal hysterectomy  2004    also had bladder sling  . Thyroidectomy  2000  . Colonoscopy w/ polypectomy  06/2009  . Right knee    . Left knee    . Rt ankle    . Back surgery    . Rt wrist      History   Social History  . Marital Status: Married    Spouse Name: N/A    Number of Children: N/A  . 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 Smoking status: Never Smoker   . Smokeless tobacco: Not on file  . Alcohol Use: No  . Drug Use: No  . Sexual Activity: Not on file   Other Topics Concern  . Not on file   Social History Narrative   Regular exercise: yes           FAMILY HISTORY:   During the visit, a 4-generation pedigree was obtained. Significant diagnoses include the following:  Family History  Problem Relation Age of Onset  . Alcohol abuse Mother   . Arthritis Mother   .  Hypertension Mother   . Bipolar disorder Mother   . Breast cancer Mother 16    unconfirmed  . Lung cancer Mother 43    smoker  . Alcohol abuse Father   . Cancer Father     lung  . Hyperlipidemia Father   . Kidney disease Father   . Diabetes Father   . Arthritis Maternal Grandmother   . Diabetes Paternal Grandmother   . Thyroid cancer Sister 57    type?; currently 2  . Other Sister     ovarian tumor @ 60; TAH/BSO  . Thyroid cancer Brother     dx 51s; currently 24  . Breast cancer Maternal Aunt     dx 24s; deceased 73  . Thyroid cancer Paternal Aunt     All 3 paternal aunts with thyroid ca in 30s/40s  . Lung cancer Paternal Aunt     2 of 3 paternal aunts with lung cancer    Additionally, Ms. Halley has two sons, ages 38 and 75.  Ms. Ehlert ancestry is Native [place], [place], [place] and [place]. There is  no known Jewish ancestry and no consanguinity.  ASSESSMENT AND PLAN: Ms. Gleed is a 58 y.o. female with a personal history of breast cancer at age 15 and thyroid cancer (type?) at age 50, as well as a concerning family history of thyroid cancer in her brother, sister and all three paternal aunts and breast cancer in her mother and a maternal aunt. The ovarian tumor she reports in her sister at age 24 is not likely an epithelial ovarian cancer. This history is suggestive of a hereditary predisposition to cancer. The combination of breast and thyroid cancers may be due to a mutation in the PTEN gene (Cowden syndrome). It may also be that she and her relatives thyroid cancers are environmentally driven by excess radiation. We reviewed the characteristics, features and inheritance patterns of hereditary cancer syndromes. We also discussed genetic testing, including the appropriate family members to test, the process of testing, insurance coverage and implications of results.   Ms. Hams wished to pursue genetic testing and a blood sample will be sent to [facility] for analysis of the 17 genes on the BreastNext panel. PTEN analysis is included in this panel. We discussed the implications of a positive, negative and/ or Variant of Uncertain Significance (VUS) result. Results should be available in approximately 4 weeks, although we did make a STAT request due to her pending surgical decision. We will contact her when results are received and address implications for her as well as address genetic testing for at-risk family members, if needed.    We encouraged Ms. Shannon to remain in contact with Cancer Genetics annually so that we can update the family history and inform her of any changes in cancer genetics and testing that may be of benefit for this family. [name] questions were answered to her satisfaction today.   Thank you for the referral and allowing [place] to share in the care of your patient.   The  patient was seen for a total of 30 minutes, greater than 50% of which was spent face-to-face counseling. This patient was discussed with the overseeing provider who agrees with the above.   Steele Berg, MS, [place] Certified Genetic Counseor phone: [phone] [email or web address]

## 2014-01-02 NOTE — Progress Notes (Addendum)
[place] Psychosocial Distress Screening Clinical Social Work  Visited with pt in [employer], introducing [facility] resources and [place], and reviewing distress screen.  The patient scored a 4 on the Psychosocial Distress Thermometer which indicates mild distress. Addressed distress needs, providing spiritual/emotional support and empathic listening.  One stressor is that family moved from [place] three years ago and has limited local support.  Pt's favorite coping tools include spending reflective time outdoors, knitting/crocheting, enjoying pets, treadmill workout.  ONCBCN DISTRESS SCREENING 01/02/2014  Screening Type Initial Screening  Elta [place] the number that describes how much distress you have been experiencing in the past week 4  Emotional problem type Nervousness/Anxiety;Adjusting to illness  Information Concerns Type Lack of info about diagnosis;Lack of info about treatment  Physical Problem type Pain;Sleep/insomnia  Physician notified of physical symptoms Yes  Referral to clinical psychology No  Referral to clinical social work No  Referral to dietition No  Referral to financial advocate No  Referral to support programs Yes  Referral to palliative care No  Other spiritual care    Spiritual Care follow up needed: No.  Pt and family aware of ongoing chaplain availability for support.  [place], [place]

## 2014-01-02 NOTE — Progress Notes (Deleted)
ONCBCN DISTRESS SCREENING 01/02/2014  Screening Type Initial Screening  Elta [place] the number that describes how much distress you have been experiencing in the past week 4  Emotional problem type Nervousness/Anxiety;Adjusting to illness  Information Concerns Type Lack of info about diagnosis;Lack of info about treatment  Physical Problem type Pain;Sleep/insomnia  Physician notified of physical symptoms Yes  Referral to clinical psychology No  Referral to clinical social work No  Referral to dietition No  Referral to financial advocate No  Referral to support programs Yes  Referral to palliative care No  Other spiritual care

## 2014-01-03 ENCOUNTER — Telehealth: Payer: Self-pay | Admitting: Hematology and Oncology

## 2014-01-03 NOTE — Telephone Encounter (Signed)
per 8/12 pof pt needs f/u w/VG 2wks post surgery. messag to breast navigators and copied to VG and scheduler re surgery. appt cannot be scheduled without having that information. pof removed from the scheduling inbox. this appt pending response from navigators.

## 2014-01-06 ENCOUNTER — Other Ambulatory Visit: Payer: BC Managed Care – PPO

## 2014-01-06 ENCOUNTER — Telehealth: Payer: Self-pay | Admitting: *Deleted

## 2014-01-06 NOTE — Telephone Encounter (Signed)
Spoke to pt concerning [place] from 12/31/13. Pt denies questions or concerns regarding dx or treatment care plan. Encourage pt to call with needs. Received verbal understanding. Contact information given.

## 2014-01-07 ENCOUNTER — Ambulatory Visit
Admission: RE | Admit: 2014-01-07 | Discharge: 2014-01-07 | Disposition: A | Discharge status: Home / Self Care | Payer: BC Managed Care – PPO | Source: Ambulatory Visit | Attending: Physician Assistant | Admitting: Physician Assistant

## 2014-01-07 DIAGNOSIS — R928 Other abnormal and inconclusive findings on diagnostic imaging of breast: Secondary | ICD-10-CM

## 2014-01-07 MED ORDER — GADOBENATE DIMEGLUMINE 529 MG/ML IV SOLN
20.0000 mL | Freq: Once | INTRAVENOUS | Status: AC | PRN
  Administered 2014-01-07: 20 mL via INTRAVENOUS

## 2014-01-20 ENCOUNTER — Encounter: Payer: Self-pay | Admitting: Genetic Counselor

## 2014-01-20 DIAGNOSIS — C50919 Malignant neoplasm of unspecified site of unspecified female breast: Secondary | ICD-10-CM

## 2014-01-20 HISTORY — DX: Malignant neoplasm of unspecified site of unspecified female breast: C50.919

## 2014-01-20 NOTE — Progress Notes (Signed)
GENETIC TEST RESULTS  Referring Physician: Nicholas Lose, MD   Ms. Tourigny was called today to discuss genetic test results. Please see the Genetics note from her visit on 01/02/14 for a detailed discussion of her personal and family history.  GENETIC TESTING: At the time of Ms. Becka's visit, we recommended she pursue genetic testing of multiple genes on the BreastNext gene panel. This test, which included sequencing and deletion/duplication analysis of 17 genes, was performed at [facility]. Testing was normal and did not reveal a mutation in these genes. The genes tested were ATM, BARD1, BRCA1, BRCA2, BRIP1, CDH1, CHEK2, MRE11A, MUTYH, NBN, NF1, PALB2, PTEN, RAD50, RAD51C, RAD51D, and TP53.  We discussed with Ms. Bittinger that since the current test is not perfect, it is possible there may be a gene mutation that current testing cannot detect, but that chance is small. We also discussed that it is possible that a different genetic factor, which was not part of this testing or has not yet been discovered, is responsible for the cancer diagnoses in the family. It is unclear why there are multiple relatives with thyroid cancer in her family. She stated that she and her relatives grew up in [place] during a time of nuclear testing. It is reassuring that a mutation in the PTEN gene has been ruled out. She did not know the type of thyroid cancer she had. If it was of medullary histology, RET testing would be indicated.  CANCER SCREENING: This result suggests that Ms. Klemann's breast cancer was most likely not due to an inherited predisposition. Most cancers happen by chance and this negative test, along with details of her family history, suggests that her breast cancer falls into this category. We, therefore, recommended she continue to follow the cancer screening guidelines provided by her physicians. As discussed above, it may be that her thyroid cancer was due to excess radiation  exposure.  FAMILY MEMBERS: Women in the family are at some increased risk of developing breast cancer, over the general population risk, simply due to the family history. We recommended they have a yearly mammogram, a yearly clinical breast exam, and perform monthly breast self-exams. A gynecologic exam is recommended yearly. Colon cancer screening is recommended to begin by age 60. Given the history of thyroid cancer, relatives are recommended to discuss thyroid screening with their own providers.  Lastly, we discussed with Ms. Mclaurin that cancer genetics is a rapidly advancing field and it is possible that new genetic tests will be appropriate for her in the future. We encouraged her to remain in contact with [place] on an annual basis so we can update her personal and family histories, and let her know of advances in cancer genetics that may benefit the family. Our contact number was provided. Ms. Meditz questions were answered to her satisfaction today, and she knows she is welcome to call anytime with additional questions.    Steele Berg, MS, [place] Certified Genetic Counseor phone: [phone] [email or web address]

## 2014-01-22 ENCOUNTER — Telehealth (INDEPENDENT_AMBULATORY_CARE_PROVIDER_SITE_OTHER): Payer: Self-pay | Admitting: General Surgery

## 2014-01-22 ENCOUNTER — Other Ambulatory Visit (INDEPENDENT_AMBULATORY_CARE_PROVIDER_SITE_OTHER): Payer: Self-pay | Admitting: General Surgery

## 2014-01-22 DIAGNOSIS — C50912 Malignant neoplasm of unspecified site of left female breast: Secondary | ICD-10-CM

## 2014-01-22 NOTE — Telephone Encounter (Signed)
I called the patient and discussed with the results of her genetic test is negative and that the MRI biopsy was also benign. With this information as we had discussed at her original consultation we will plan to proceed with breast conservation with needle localized lumpectomy and axillary sentinel lymph node biopsy. We discussed this plan in detail by phone and she feels that she is comfortable proceeding without another face-to-face encounter. We will schedule the surgery for her.

## 2014-02-03 ENCOUNTER — Telehealth: Payer: Self-pay | Admitting: Hematology and Oncology

## 2014-02-03 NOTE — Telephone Encounter (Signed)
, °

## 2014-02-14 ENCOUNTER — Telehealth: Payer: Self-pay | Admitting: Hematology and Oncology

## 2014-02-14 NOTE — Telephone Encounter (Signed)
Lft msg for pt regarding MD visit and change, mailed out sch.....  KJ

## 2014-02-17 ENCOUNTER — Ambulatory Visit (INDEPENDENT_AMBULATORY_CARE_PROVIDER_SITE_OTHER): Payer: BC Managed Care – PPO | Admitting: Physician Assistant

## 2014-02-17 ENCOUNTER — Encounter: Payer: Self-pay | Admitting: Physician Assistant

## 2014-02-17 ENCOUNTER — Encounter (HOSPITAL_BASED_OUTPATIENT_CLINIC_OR_DEPARTMENT_OTHER): Payer: Self-pay | Admitting: *Deleted

## 2014-02-17 VITALS — BP 141/82 | HR 81 | Temp 98.0°F | Resp 16 | Ht 67.0 in | Wt 262.0 lb

## 2014-02-17 DIAGNOSIS — R3989 Other symptoms and signs involving the genitourinary system: Secondary | ICD-10-CM

## 2014-02-17 DIAGNOSIS — Z23 Encounter for immunization: Secondary | ICD-10-CM

## 2014-02-17 DIAGNOSIS — R82998 Other abnormal findings in urine: Secondary | ICD-10-CM

## 2014-02-17 DIAGNOSIS — R8290 Unspecified abnormal findings in urine: Secondary | ICD-10-CM

## 2014-02-17 DIAGNOSIS — N390 Urinary tract infection, site not specified: Secondary | ICD-10-CM

## 2014-02-17 DIAGNOSIS — R399 Unspecified symptoms and signs involving the genitourinary system: Secondary | ICD-10-CM | POA: Insufficient documentation

## 2014-02-17 HISTORY — DX: Urinary tract infection, site not specified: N39.0

## 2014-02-17 LAB — POCT URINALYSIS DIPSTICK
Bilirubin, UA: NEGATIVE
Glucose, UA: NEGATIVE
Ketones, UA: NEGATIVE
Nitrite, UA: POSITIVE
PH UA: 6
Protein, UA: 100
Spec Grav, UA: 1.025
Urobilinogen, UA: 0.2

## 2014-02-17 NOTE — Assessment & Plan Note (Signed)
Rx Ciprofloxacin.  Increase fluids. Cranberry supplement and probiotic.  Will send urine for culture.

## 2014-02-17 NOTE — Progress Notes (Signed)
    SUBJECTIVE: Michelle Kennedy is a 58 y.o. female who complains of urinary frequency, urgency and dysuria x 1 week, without flank pain, fever, chills, or abnormal vaginal discharge or bleeding.   OBJECTIVE: Appears well, in no apparent distress.  Vital signs are normal. The abdomen is soft without tenderness, guarding, mass, rebound or organomegaly. No CVA tenderness or inguinal adenopathy noted. Urine dipstick shows positive for RBC's, positive for nitrates and positive for leukocytes.  Micro exam: pending  ASSESSMENT: UTI uncomplicated without evidence of pyelonephritis  PLAN: Rx Ciprofloxacin x 3 days. Urine sent for culture. Patient to also push fluids, may use Pyridium OTC prn. Call or return to clinic prn if these symptoms worsen or fail to improve as anticipated.

## 2014-02-17 NOTE — Progress Notes (Signed)
Pre visit review using our clinic review tool, if applicable. No additional management support is needed unless otherwise documented below in the visit note/SLS  

## 2014-02-17 NOTE — Patient Instructions (Signed)
Instructions were handwritten and EPIC EMR was down at time of visit.

## 2014-02-20 LAB — CULTURE, URINE COMPREHENSIVE: Colony Count: >=100000

## 2014-02-20 MED ORDER — CIPROFLOXACIN HCL 250 MG PO TABS: 250.0000 mg | ORAL_TABLET | Freq: Two times a day (BID) | ORAL | Status: DC

## 2014-02-20 NOTE — Addendum Note (Signed)
Addended by: Varney Daily on: 02/20/2014 05:01 PM   Modules accepted: Orders

## 2014-02-23 ENCOUNTER — Encounter (HOSPITAL_BASED_OUTPATIENT_CLINIC_OR_DEPARTMENT_OTHER): Payer: BC Managed Care – PPO | Admitting: Anesthesiology

## 2014-02-23 ENCOUNTER — Encounter (HOSPITAL_COMMUNITY)
Admission: RE | Admit: 2014-02-23 | Discharge: 2014-02-23 | Disposition: A | Discharge status: Home / Self Care | Payer: BC Managed Care – PPO | Source: Ambulatory Visit | Attending: General Surgery | Admitting: General Surgery

## 2014-02-23 ENCOUNTER — Ambulatory Visit
Admit: 2014-02-23 | Discharge: 2014-02-23 | Disposition: A | Discharge status: Home / Self Care | Payer: BC Managed Care – PPO | Attending: General Surgery | Admitting: General Surgery

## 2014-02-23 ENCOUNTER — Encounter (HOSPITAL_BASED_OUTPATIENT_CLINIC_OR_DEPARTMENT_OTHER): Payer: Self-pay

## 2014-02-23 ENCOUNTER — Ambulatory Visit (HOSPITAL_BASED_OUTPATIENT_CLINIC_OR_DEPARTMENT_OTHER)
Admission: RE | Admit: 2014-02-23 | Discharge: 2014-02-23 | Disposition: A | Discharge status: Home / Self Care | Payer: BC Managed Care – PPO | Source: Ambulatory Visit | Attending: General Surgery | Admitting: General Surgery

## 2014-02-23 ENCOUNTER — Inpatient Hospital Stay: Admission: RE | Admit: 2014-02-23 | Payer: BC Managed Care – PPO | Source: Ambulatory Visit

## 2014-02-23 ENCOUNTER — Ambulatory Visit (HOSPITAL_BASED_OUTPATIENT_CLINIC_OR_DEPARTMENT_OTHER): Payer: BC Managed Care – PPO | Admitting: Anesthesiology

## 2014-02-23 ENCOUNTER — Encounter (HOSPITAL_BASED_OUTPATIENT_CLINIC_OR_DEPARTMENT_OTHER)
Admission: RE | Disposition: A | Discharge status: Home / Self Care | Payer: Self-pay | Source: Ambulatory Visit | Attending: General Surgery

## 2014-02-23 ENCOUNTER — Ambulatory Visit
Admission: RE | Admit: 2014-02-23 | Discharge: 2014-02-23 | Disposition: A | Discharge status: Home / Self Care | Payer: BC Managed Care – PPO | Source: Ambulatory Visit | Attending: General Surgery | Admitting: General Surgery

## 2014-02-23 DIAGNOSIS — Z8041 Family history of malignant neoplasm of ovary: Secondary | ICD-10-CM | POA: Diagnosis not present

## 2014-02-23 DIAGNOSIS — C50412 Malignant neoplasm of upper-outer quadrant of left female breast: Secondary | ICD-10-CM

## 2014-02-23 DIAGNOSIS — Z803 Family history of malignant neoplasm of breast: Secondary | ICD-10-CM | POA: Insufficient documentation

## 2014-02-23 DIAGNOSIS — K219 Gastro-esophageal reflux disease without esophagitis: Secondary | ICD-10-CM | POA: Insufficient documentation

## 2014-02-23 DIAGNOSIS — F419 Anxiety disorder, unspecified: Secondary | ICD-10-CM | POA: Diagnosis not present

## 2014-02-23 DIAGNOSIS — E039 Hypothyroidism, unspecified: Secondary | ICD-10-CM | POA: Diagnosis not present

## 2014-02-23 DIAGNOSIS — I10 Essential (primary) hypertension: Secondary | ICD-10-CM | POA: Diagnosis not present

## 2014-02-23 DIAGNOSIS — C50912 Malignant neoplasm of unspecified site of left female breast: Secondary | ICD-10-CM

## 2014-02-23 DIAGNOSIS — J45909 Unspecified asthma, uncomplicated: Secondary | ICD-10-CM | POA: Insufficient documentation

## 2014-02-23 DIAGNOSIS — Z79899 Other long term (current) drug therapy: Secondary | ICD-10-CM | POA: Insufficient documentation

## 2014-02-23 DIAGNOSIS — F329 Major depressive disorder, single episode, unspecified: Secondary | ICD-10-CM | POA: Diagnosis not present

## 2014-02-23 DIAGNOSIS — E785 Hyperlipidemia, unspecified: Secondary | ICD-10-CM | POA: Diagnosis not present

## 2014-02-23 DIAGNOSIS — Z170 Estrogen receptor positive status [ER+]: Secondary | ICD-10-CM | POA: Insufficient documentation

## 2014-02-23 DIAGNOSIS — M1990 Unspecified osteoarthritis, unspecified site: Secondary | ICD-10-CM | POA: Diagnosis not present

## 2014-02-23 HISTORY — DX: Presence of dental prosthetic device (complete) (partial): Z97.2

## 2014-02-23 HISTORY — DX: Personal history of malignant neoplasm of thyroid: Z85.850

## 2014-02-23 HISTORY — DX: Family history of other specified conditions: Z84.89

## 2014-02-23 HISTORY — DX: Personal history of other specified conditions: Z87.898

## 2014-02-23 HISTORY — DX: Malignant neoplasm of unspecified site of unspecified female breast: C50.919

## 2014-02-23 HISTORY — DX: Microstomia: Q18.5

## 2014-02-23 HISTORY — DX: Personal history of peptic ulcer disease: Z87.11

## 2014-02-23 HISTORY — DX: Personal history of other diseases of the digestive system: Z87.19

## 2014-02-23 HISTORY — DX: Fibromyalgia: M79.7

## 2014-02-23 HISTORY — DX: Urinary tract infection, site not specified: N39.0

## 2014-02-23 HISTORY — DX: Hypothyroidism, unspecified: E03.9

## 2014-02-23 HISTORY — DX: Migraine, unspecified, not intractable, without status migrainosus: G43.909

## 2014-02-23 HISTORY — PX: BREAST LUMPECTOMY WITH NEEDLE LOCALIZATION AND AXILLARY SENTINEL LYMPH NODE BX: SHX5760

## 2014-02-23 HISTORY — DX: Dental restoration status: Z98.811

## 2014-02-23 SURGERY — BREAST LUMPECTOMY WITH NEEDLE LOCALIZATION AND AXILLARY SENTINEL LYMPH NODE BX
Anesthesia: General | Site: Breast | Laterality: Left

## 2014-02-23 MED ORDER — FENTANYL CITRATE 0.05 MG/ML IJ SOLN
INTRAMUSCULAR | Status: AC
  Filled 2014-02-23: qty 2

## 2014-02-23 MED ORDER — FENTANYL CITRATE 0.05 MG/ML IJ SOLN
50.0000 ug | INTRAMUSCULAR | Status: DC | PRN
  Administered 2014-02-23: 100 ug via INTRAVENOUS

## 2014-02-23 MED ORDER — CHLORHEXIDINE GLUCONATE 4 % EX LIQD: 1.0000 "application " | Freq: Once | CUTANEOUS | Status: DC

## 2014-02-23 MED ORDER — BUPIVACAINE-EPINEPHRINE (PF) 0.5% -1:200000 IJ SOLN
INTRAMUSCULAR | Status: AC
  Filled 2014-02-23: qty 30

## 2014-02-23 MED ORDER — CIPROFLOXACIN IN D5W 400 MG/200ML IV SOLN
400.0000 mg | INTRAVENOUS | Status: AC
  Administered 2014-02-23: 400 mg via INTRAVENOUS

## 2014-02-23 MED ORDER — OXYCODONE HCL 5 MG PO TABS
ORAL_TABLET | ORAL | Status: AC
  Filled 2014-02-23: qty 1

## 2014-02-23 MED ORDER — SODIUM CHLORIDE 0.9 % IJ SOLN
INTRAMUSCULAR | Status: DC | PRN
  Administered 2014-02-23: 14:00:00

## 2014-02-23 MED ORDER — PROPOFOL 10 MG/ML IV BOLUS
INTRAVENOUS | Status: DC | PRN
  Administered 2014-02-23: 150 mg via INTRAVENOUS

## 2014-02-23 MED ORDER — FENTANYL CITRATE 0.05 MG/ML IJ SOLN
INTRAMUSCULAR | Status: AC
  Filled 2014-02-23: qty 4

## 2014-02-23 MED ORDER — TECHNETIUM TC 99M SULFUR COLLOID FILTERED
1.0000 | Freq: Once | INTRAVENOUS | Status: AC | PRN
  Administered 2014-02-23: 1 via INTRADERMAL

## 2014-02-23 MED ORDER — MIDAZOLAM HCL 2 MG/2ML IJ SOLN
INTRAMUSCULAR | Status: AC
  Filled 2014-02-23: qty 2

## 2014-02-23 MED ORDER — MIDAZOLAM HCL 5 MG/5ML IJ SOLN
INTRAMUSCULAR | Status: DC | PRN
  Administered 2014-02-23: 2 mg via INTRAVENOUS

## 2014-02-23 MED ORDER — HYDROMORPHONE HCL 1 MG/ML IJ SOLN
INTRAMUSCULAR | Status: AC
  Filled 2014-02-23: qty 1

## 2014-02-23 MED ORDER — OXYCODONE HCL 5 MG/5ML PO SOLN: 5.0000 mg | Freq: Once | ORAL | Status: AC | PRN

## 2014-02-23 MED ORDER — HYDROMORPHONE HCL 1 MG/ML IJ SOLN
0.2500 mg | INTRAMUSCULAR | Status: DC | PRN
  Administered 2014-02-23: 0.5 mg via INTRAVENOUS

## 2014-02-23 MED ORDER — LACTATED RINGERS IV SOLN
INTRAVENOUS | Status: DC
  Administered 2014-02-23 (×2): via INTRAVENOUS

## 2014-02-23 MED ORDER — ONDANSETRON HCL 4 MG/2ML IJ SOLN: 4.0000 mg | Freq: Four times a day (QID) | INTRAMUSCULAR | Status: DC | PRN

## 2014-02-23 MED ORDER — OXYCODONE HCL 5 MG PO TABS
5.0000 mg | ORAL_TABLET | Freq: Once | ORAL | Status: AC | PRN
  Administered 2014-02-23: 5 mg via ORAL

## 2014-02-23 MED ORDER — METHYLENE BLUE 1 % INJ SOLN
INTRAMUSCULAR | Status: AC
  Filled 2014-02-23: qty 10

## 2014-02-23 MED ORDER — SODIUM CHLORIDE 0.9 % IJ SOLN
INTRAMUSCULAR | Status: AC
  Filled 2014-02-23: qty 10

## 2014-02-23 MED ORDER — ONDANSETRON HCL 4 MG/2ML IJ SOLN
INTRAMUSCULAR | Status: DC | PRN
  Administered 2014-02-23: 4 mg via INTRAVENOUS

## 2014-02-23 MED ORDER — OXYCODONE-ACETAMINOPHEN 5-325 MG PO TABS: ORAL_TABLET | ORAL | Status: DC

## 2014-02-23 MED ORDER — EPHEDRINE SULFATE 50 MG/ML IJ SOLN
INTRAMUSCULAR | Status: DC | PRN
  Administered 2014-02-23 (×2): 15 mg via INTRAVENOUS
  Administered 2014-02-23: 10 mg via INTRAVENOUS

## 2014-02-23 MED ORDER — MIDAZOLAM HCL 2 MG/2ML IJ SOLN
1.0000 mg | INTRAMUSCULAR | Status: DC | PRN
  Administered 2014-02-23: 2 mg via INTRAVENOUS

## 2014-02-23 MED ORDER — FENTANYL CITRATE 0.05 MG/ML IJ SOLN
INTRAMUSCULAR | Status: DC | PRN
  Administered 2014-02-23: 25 ug via INTRAVENOUS
  Administered 2014-02-23: 100 ug via INTRAVENOUS
  Administered 2014-02-23: 25 ug via INTRAVENOUS

## 2014-02-23 MED ORDER — BUPIVACAINE-EPINEPHRINE 0.5% -1:200000 IJ SOLN
INTRAMUSCULAR | Status: DC | PRN
  Administered 2014-02-23: 20 mL

## 2014-02-23 MED ORDER — DEXAMETHASONE SODIUM PHOSPHATE 4 MG/ML IJ SOLN
INTRAMUSCULAR | Status: DC | PRN
  Administered 2014-02-23: 10 mg via INTRAVENOUS

## 2014-02-23 MED ORDER — CIPROFLOXACIN IN D5W 400 MG/200ML IV SOLN
INTRAVENOUS | Status: AC
  Filled 2014-02-23: qty 200

## 2014-02-23 MED ORDER — MIDAZOLAM HCL 2 MG/ML PO SYRP: 12.0000 mg | ORAL_SOLUTION | Freq: Once | ORAL | Status: DC | PRN

## 2014-02-23 SURGICAL SUPPLY — 63 items
APPLIER CLIP 11 MED OPEN (CLIP) ×3
BINDER BREAST XXLRG (GAUZE/BANDAGES/DRESSINGS) ×3 IMPLANT
BLADE SURG 10 STRL SS (BLADE) IMPLANT
BLADE SURG 15 STRL LF DISP TIS (BLADE) ×1 IMPLANT
BLADE SURG 15 STRL SS (BLADE) ×2
CANISTER SUCT 1200ML W/VALVE (MISCELLANEOUS) ×3 IMPLANT
CHLORAPREP W/TINT 26ML (MISCELLANEOUS) ×6 IMPLANT
CLIP APPLIE 11 MED OPEN (CLIP) ×1 IMPLANT
CLIP TI WIDE RED SMALL 6 (CLIP) ×3 IMPLANT
CONT SPECI 4OZ STER CLIK (MISCELLANEOUS) ×6 IMPLANT
COVER MAYO STAND STRL (DRAPES) ×3 IMPLANT
COVER PROBE W GEL 5X96 (DRAPES) ×3 IMPLANT
COVER TABLE BACK 60X90 (DRAPES) ×3 IMPLANT
DECANTER SPIKE VIAL GLASS SM (MISCELLANEOUS) IMPLANT
DERMABOND ADVANCED (GAUZE/BANDAGES/DRESSINGS) ×2
DERMABOND ADVANCED .7 DNX12 (GAUZE/BANDAGES/DRESSINGS) ×1 IMPLANT
DEVICE DUBIN W/COMP PLATE 8390 (MISCELLANEOUS) ×3 IMPLANT
DRAIN CHANNEL 19F RND (DRAIN) IMPLANT
DRAIN HEMOVAC 1/8 X 5 (WOUND CARE) IMPLANT
DRAPE LAPAROSCOPIC ABDOMINAL (DRAPES) ×3 IMPLANT
DRAPE UTILITY XL STRL (DRAPES) ×3 IMPLANT
ELECT COATED BLADE 2.86 ST (ELECTRODE) ×3 IMPLANT
ELECT REM PT RETURN 9FT ADLT (ELECTROSURGICAL) ×3
ELECTRODE REM PT RTRN 9FT ADLT (ELECTROSURGICAL) ×1 IMPLANT
EVACUATOR SILICONE 100CC (DRAIN) IMPLANT
GLOVE BIOGEL PI IND STRL 7.0 (GLOVE) ×1 IMPLANT
GLOVE BIOGEL PI IND STRL 8 (GLOVE) ×2 IMPLANT
GLOVE BIOGEL PI INDICATOR 7.0 (GLOVE) ×2
GLOVE BIOGEL PI INDICATOR 8 (GLOVE) ×4
GLOVE SS BIOGEL STRL SZ 7.5 (GLOVE) IMPLANT
GLOVE SUPERSENSE BIOGEL SZ 7.5 (GLOVE)
GLOVE SURG SS PI 7.0 STRL IVOR (GLOVE) ×3 IMPLANT
GLOVE SURG SS PI 7.5 STRL IVOR (GLOVE) ×6 IMPLANT
GLOVE SURG SS PI 8.0 STRL IVOR (GLOVE) ×3 IMPLANT
GOWN STRL REUS W/ TWL LRG LVL3 (GOWN DISPOSABLE) ×1 IMPLANT
GOWN STRL REUS W/ TWL XL LVL3 (GOWN DISPOSABLE) ×2 IMPLANT
GOWN STRL REUS W/TWL LRG LVL3 (GOWN DISPOSABLE) ×2
GOWN STRL REUS W/TWL XL LVL3 (GOWN DISPOSABLE) ×4
KIT MARKER MARGIN INK (KITS) ×3 IMPLANT
NDL SAFETY ECLIPSE 18X1.5 (NEEDLE) ×1 IMPLANT
NEEDLE HYPO 18GX1.5 SHARP (NEEDLE) ×2
NEEDLE HYPO 25X1 1.5 SAFETY (NEEDLE) ×6 IMPLANT
NS IRRIG 1000ML POUR BTL (IV SOLUTION) ×3 IMPLANT
PACK BASIN DAY SURGERY FS (CUSTOM PROCEDURE TRAY) ×3 IMPLANT
PAD ALCOHOL SWAB (MISCELLANEOUS) ×3 IMPLANT
PENCIL BUTTON HOLSTER BLD 10FT (ELECTRODE) ×3 IMPLANT
PIN SAFETY STERILE (MISCELLANEOUS) IMPLANT
SLEEVE SCD COMPRESS KNEE MED (MISCELLANEOUS) ×3 IMPLANT
SPONGE LAP 18X18 X RAY DECT (DISPOSABLE) IMPLANT
SPONGE LAP 4X18 X RAY DECT (DISPOSABLE) ×3 IMPLANT
SUT ETHILON 3 0 FSL (SUTURE) IMPLANT
SUT MON AB 4-0 PC3 18 (SUTURE) IMPLANT
SUT MON AB 5-0 PS2 18 (SUTURE) ×3 IMPLANT
SUT SILK 3 0 SH 30 (SUTURE) IMPLANT
SUT VIC AB 3-0 SH 27 (SUTURE)
SUT VIC AB 3-0 SH 27X BRD (SUTURE) IMPLANT
SUT VICRYL 3-0 CR8 SH (SUTURE) ×3 IMPLANT
SYR CONTROL 10ML LL (SYRINGE) ×6 IMPLANT
TOWEL OR 17X24 6PK STRL BLUE (TOWEL DISPOSABLE) ×3 IMPLANT
TOWEL OR NON WOVEN STRL DISP B (DISPOSABLE) ×3 IMPLANT
TUBE CONNECTING 20'X1/4 (TUBING) ×1
TUBE CONNECTING 20X1/4 (TUBING) ×2 IMPLANT
YANKAUER SUCT BULB TIP NO VENT (SUCTIONS) ×3 IMPLANT

## 2014-02-23 NOTE — Op Note (Signed)
Preoperative Diagnosis: left breast cancer  Postoprative Diagnosis: left breast cancer  Procedure: Procedure(s): Blue dye injection left breast, BREAST LUMPECTOMY WITH NEEDLE LOCALIZATION AND AXILLARY SENTINEL LYMPH NODE BIOPSY   Surgeon: Excell Seltzer T   Assistants: None  Anesthesia:  General LMA anesthesia  Indications: patient is a 58 year old female with a recent diagnosis of invasive ductal carcinoma of the left breast. By MRI for tumor measures 2.5 x 2 x 5 cm with the largest being anterior to posterior. A separate more anterior nodule on MRI has been biopsied and was negative. After discussion regarding options detailed elsewhere we have elected to proceed with needle localized lumpectomy and left axillary sentinel lymph node biopsy is initial surgical treatment.    Procedure Detail:  Following needle localization and following injection of 1 mCi of technetium sulfur colloid intradermally around the left nipple in the holding area the patient brought to the operating room, placed in the supine position on the operating table, and general LMA anesthesia induced. Under sterile technique after patient now I injected 5 cc of dilute methylene blue beneath the left nipple and massaged this for several minutes. The entire breast and chest and axilla and upper arm were then widely sterilely prepped and draped. She received preoperative IV antibiotics. PAS rod placed. Patient timeout was again performed and correct procedure verified.  The lumpectomy was approached initially. I made a curvilinear incision in the upper-outer breast a little medial to the lateral wire insertion site and dissection was carried down into the subcutaneous tissue. A short skin and subcutaneous flap was raised posteriorly and the wire brought into the incision. As I approached the breast capsule there was a palpable mass. I then excised with cautery a very generous lumpectomy specimen around the shaft and tip of the  wire extending the dissection further anterior and posterior do to the shape of the enhancement on MRI. I stayed away from the palpable mass and the specimen was removed. Grossly it appeared contained. The specimen was inked for margins. The specimen mammography showed the clip in the attack wire and the mass apparently in the center of the specimen. This wound was packed with gauze. A hot area in the left axilla was localized with the neoprobe and a small transverse incision made. Dissection was carried down through the subcutaneous tissue to the clavipectoral fascia which was incised. With blunt dissection I encountered a blue node with high counts it was completely excised with cautery. There were still fairly high counts in the axilla. 2 further nodes were excised and had moderately high counts but no blue dye. There was still significant counts which actually were very deep and medial in the axilla. Fairly tedious dissection well back against the chest wall posteriorly bluntly dissected out a somewhat enlarged but soft bright blue lymph node with high counts. This was completely excised with cautery and the hilum clipped. It had ex vivo counts in excess of 700. Background in the axilla at this point was essentially 0. There was no other palpable adenopathy.  Due to the size of the breast tumor I elected to take additional shaved margins from all margins which was accomplished with cautery although I did not take a posterior margin as the posterior dissection had extended into the axillary fat.  The superior margin abutted the pectoralis muscle and fascia was taken. Both wounds were irrigated thoroughly and complete hemostasis obtained. Soft tissue was infiltrated with Marcaine. The deep subcutaneous tissue was closed with interrupted 3-0 Vicryl and  the skin incisions closed with subcuticular 5-0 Monocryl and Dermabond. Sponge needle and instrument counts were correct.    Findings: As above  Estimated  Blood Loss:  Minimal         Drains: none  Blood Given: none          Specimens: #1 left breast lumpectomy   #2 axillary sentinel lymph nodes x4    #3 shaved non-oriented lumpectomy margins        Complications:  * No complications entered in OR log *         Disposition: PACU - hemodynamically stable.         Condition: stable

## 2014-02-23 NOTE — Anesthesia Procedure Notes (Signed)
Procedure Name: LMA Insertion Date/Time: 02/23/2014 2:06 PM Performed by: Melynda Ripple D Pre-anesthesia Checklist: Patient identified, Emergency Drugs available, Suction available and Patient being monitored Patient Re-evaluated:Patient Re-evaluated prior to inductionOxygen Delivery Method: Circle System Utilized Preoxygenation: Pre-oxygenation with 100% oxygen Intubation Type: IV induction Ventilation: Mask ventilation without difficulty LMA: LMA inserted LMA Size: 4.0 Number of attempts: 1 Airway Equipment and Method: bite block Placement Confirmation: positive ETCO2 Tube secured with: Tape Dental Injury: Teeth and Oropharynx as per pre-operative assessment

## 2014-02-23 NOTE — Discharge Instructions (Signed)
[facility] °Office Phone Number [phone] ° °BREAST BIOPSY/ PARTIAL MASTECTOMY: POST OP INSTRUCTIONS ° °Always review your discharge instruction sheet given to you by the facility where your surgery was performed. ° °IF YOU HAVE DISABILITY OR FAMILY LEAVE FORMS, YOU MUST BRING THEM TO THE OFFICE FOR PROCESSING.  DO NOT GIVE THEM TO YOUR DOCTOR. ° °1. A prescription for pain medication may be given to you upon discharge.  Take your pain medication as prescribed, if needed.  If narcotic pain medicine is not needed, then you may take acetaminophen (Tylenol) or ibuprofen (Advil) as needed. °2. Take your usually prescribed medications unless otherwise directed °3. If you need a refill on your pain medication, please contact your pharmacy.  They will contact our office to request authorization.  Prescriptions will not be filled after 5pm or on week-ends. °4. You should eat very light the first 24 hours after surgery, such as soup, crackers, pudding, etc.  Resume your normal diet the day after surgery. °5. Most patients will experience some swelling and bruising in the breast.  Ice packs and a good support bra will help.  Swelling and bruising can take several days to resolve.  °6. It is common to experience some constipation if taking pain medication after surgery.  Increasing fluid intake and taking a stool softener will usually help or prevent this problem from occurring.  A mild laxative (Milk of Magnesia or Miralax) should be taken according to package directions if there are no bowel movements after 48 hours. °7. Unless discharge instructions indicate otherwise, you may remove your bandages 24-48 hours after surgery, and you may shower at that time.  You may have steri-strips (small skin tapes) in place directly over the incision.  These strips should be left on the skin for 7-10 days.  If your surgeon used skin glue on the incision, you may shower in 24 hours.  The glue will flake off over the  next 2-3 weeks.  Any sutures or staples will be removed at the office during your follow-up visit. °8. ACTIVITIES:  You may resume regular daily activities (gradually increasing) beginning the next day.  Wearing a good support bra or sports bra minimizes pain and swelling.  You may have sexual intercourse when it is comfortable. °a. You may drive when you no longer are taking prescription pain medication, you can comfortably wear a seatbelt, and you can safely maneuver your car and apply brakes. °b. RETURN TO WORK:  ______________________________________________________________________________________ °9. You should see your doctor in the office for a follow-up appointment approximately two weeks after your surgery.  Your doctor’s nurse will typically make your follow-up appointment when she calls you with your pathology report.  Expect your pathology report 2-3 business days after your surgery.  You may call to check if you do not hear from us after three days. °10. OTHER INSTRUCTIONS: _______________________________________________________________________________________________ _____________________________________________________________________________________________________________________________________ °_____________________________________________________________________________________________________________________________________ °_____________________________________________________________________________________________________________________________________ ° °WHEN TO CALL YOUR DOCTOR: °1. Fever over 101.0 °2. Nausea and/or vomiting. °3. Extreme swelling or bruising. °4. Continued bleeding from incision. °5. Increased pain, redness, or drainage from the incision. ° °The clinic staff is available to answer your questions during regular business hours.  Please don’t hesitate to call and ask to speak to one of the nurses for clinical concerns.  If you have a medical emergency, go to the nearest  emergency room or call 911.  A surgeon from [facility] is always on call at the hospital. ° °For further questions, please visit [email or web address]  ° ° °  Post Anesthesia Home Care Instructions ° °Activity: °Get plenty of rest for the remainder of the day. A responsible adult should stay with you for 24 hours following the procedure.  °For the next 24 hours, DO NOT: °-Drive a car °-Operate machinery °-Drink alcoholic beverages °-Take any medication unless instructed by your physician °-Make any legal decisions or sign important papers. ° °Meals: °Start with liquid foods such as gelatin or soup. Progress to regular foods as tolerated. Avoid greasy, spicy, heavy foods. If nausea and/or vomiting occur, drink only clear liquids until the nausea and/or vomiting subsides. Call your physician if vomiting continues. ° °Special Instructions/Symptoms: °Your throat may feel dry or sore from the anesthesia or the breathing tube placed in your throat during surgery. If this causes discomfort, gargle with warm salt water. The discomfort should disappear within 24 hours. ° °

## 2014-02-23 NOTE — H&P (Signed)
Chief Complaint: New diagnosis of breast cancer  History:   Michelle Kennedy is a 58 y.o. postmenopausal female referred by Dr. Lajean Manes for evaluation of recently diagnosed carcinoma of the left breast. She recently Noted a lump in her left breast approximately 6 weeks ago. She saw her primary physician and was referred to the breast center for evaluation. Subsequent imaging included diagnostic mamogram showing A spiculated mass in the upper-outer left breast corresponding to the palpable abnormality and ultrasound showing A hypoechoic irregular mass at the 2:00 position of the left breast 10 cm from the nipple measuring 18 x 14 x 16 mm.. 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 She is seen now in Breast multidisciplinary clinic for initial treatment planning. She has experienced a breast lump on exam and No other symptoms such as pain or nipple discharge or skin changes. She does have a personal history of any previous breast problems With a benign cyst being removed some years ago. Subsequent bilateral breast MRI has been performed. Noted in the left upper outer quadrant is an area of enhancement consistent with the known primary tumor, but also a clump nodular area of enhancement at the anterior aspect measuring in total of 2.5 cm anterior to posterior. In addition to this he has a 6 mm abnormal area masslike enhancement another 3 cm anterior to this more toward the sub-areolar area of some concern as well. A subsequent biopsy of the most anterior area of masslike enhancement was negative for malignancy. Findings at that time were the following:  Tumor size: 5.5 cm  Tumor grade: 2  Estrogen Receptor: positive  Progesterone Receptor: positive  Her-2 neu: negative  Lymph node status: negative  Past Medical History   Diagnosis  Date   .  Arthritis    .  Asthma    .  GERD (gastroesophageal reflux disease)    .   Depression    .  Allergy    .  Hypertension    .  Hyperlipidemia    .  Diverticulitis  06/2009   .  Migraine    .  History of esophageal ulcer    .  Colon polyps    .  Cancer  2000     thyroid   .  Urinary incontinence    .  Hx: UTI (urinary tract infection)    .  HYPOTHYROIDISM  12/20/2009   .  Anxiety    .  Hot flashes     Past Surgical History   Procedure  Laterality  Date   .  Cholecystectomy   1990   .  Breast surgery   2011     left breast biposy   .  Appendectomy   2004   .  Tonsillectomy and adenoidectomy   2000   .  Abdominal hysterectomy   2004     also had bladder sling   .  Thyroidectomy   2000   .  Colonoscopy w/ polypectomy   06/2009   .  Right knee     .  Left knee     .  Rt ankle     .  Back surgery     .  Rt wrist      Current Outpatient Prescriptions   Medication  Sig  Dispense  Refill   .  albuterol (PROVENTIL HFA;VENTOLIN HFA) 108 (90 BASE) MCG/ACT inhaler  Inhale 2 puffs into the lungs every  6 (six) hours as needed for wheezing or shortness of breath. Please dispense Proair  8.5 g  5   .  benzonatate (TESSALON) 200 MG capsule  Take 1 capsule (200 mg total) by mouth 3 (three) times daily as needed for cough.  30 capsule  1   .  Calcium Carbonate (CALCIUM 500 PO)  Take 1 tablet by mouth 2 (two) times daily.     .  cetirizine (ZYRTEC) 10 MG tablet  Take 10 mg by mouth daily.     .  diphenhydrAMINE (BENADRYL) 25 MG tablet  Take 1 tablet an hour before MRI. Then take 1 tablet at bedtime as needed, per your usual regimen.  30 tablet  0   .  DULoxetine (CYMBALTA) 60 MG capsule  Take 1 capsule (60 mg total) by mouth daily.  90 capsule  1   .  EPINEPHrine 0.3 mg/0.3 mL IJ SOAJ injection  Inject 0.3 mLs (0.3 mg total) into the muscle once.  2 Device  0   .  Ergocalciferol (VITAMIN D2) 400 UNITS TABS  Take 1 tablet by mouth daily.     Marland Kitchen  estrogens, conjugated, (PREMARIN) 0.625 MG tablet  Take 1 tablet (0.625 mg total) by mouth daily.  30 tablet  0   .   Fluticasone-Salmeterol (ADVAIR DISKUS) 250-50 MCG/DOSE AEPB  Inhale 1 puff into the lungs 2 (two) times daily.  180 each  1   .  levothyroxine (SYNTHROID, LEVOTHROID) 150 MCG tablet  Take 1 tablet (150 mcg total) by mouth daily.  30 tablet  1   .  meloxicam (MOBIC) 7.5 MG tablet  TAKE 1 TABLET DAILY  90 tablet  0   .  Multiple Vitamin (MULTIVITAMIN) tablet  Take 1 tablet by mouth daily.     .  Omega-3 Fatty Acids (FISH OIL) 1200 MG CAPS  Take 1 capsule by mouth daily.     Marland Kitchen  omeprazole (PRILOSEC) 40 MG capsule  TAKE ONE CAPSULE BY MOUTH EVERY DAY  90 capsule  3   .  oxybutynin (DITROPAN-XL) 5 MG 24 hr tablet  Take 5 mg by mouth daily.     .  simvastatin (ZOCOR) 40 MG tablet  TAKE 1 TABLET (40MG) BY MOUTH DAILY  90 tablet  1    No current facility-administered medications for this visit.    Family History   Problem  Relation  Age of Onset   .  Alcohol abuse  Mother    .  Arthritis  Mother    .  Cancer  Mother      ovarian, breast   .  Hypertension  Mother    .  Bipolar disorder  Mother    .  Alcohol abuse  Father    .  Cancer  Father      lung   .  Hyperlipidemia  Father    .  Kidney disease  Father    .  Diabetes  Father    .  Arthritis  Maternal Grandmother    .  Diabetes  Paternal Grandmother    .  Cancer  Sister     History    Social History   .  Marital Status:  Married     Spouse Name:  N/A     Number of Children:  N/A   .  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  Smoking status:  Never Smoker   .  Smokeless tobacco:  Not on file   .  Alcohol Use:  No   .  Drug Use:  No   .  Sexual Activity:  Not on file    Other Topics  Concern   .  Not on file    Social History Narrative    Regular exercise: yes         Review of Systems  Constitutional: positive for fatigue  Respiratory: negative  Cardiovascular: negative  Gastrointestinal: negative  Neurological: negative  Behavioral/Psych: positive for  anxiety  Endocrine: positive for thyroid replacement post thyroidectomy  Allergic/Immunologic: positive for multiple allergies particularly to IV agents  Objective:   There were no vitals taken for this visit.  General: Alert, Moderately obese Caucasian female, in no distress  Skin: Warm and dry without rash or infection.  HEENT: No palpable masses or thyromegaly. Sclera nonicteric. Pupils equal round and reactive. Oropharynx clear.  Breasts: Somewhat subtle mass in the upper-outer quadrant of the left breast. No skin changes or nipple retraction. No palpable axillary lymph nodes  Lymph nodes: No cervical, supraclavicular, or inguinal nodes palpable.  Lungs: Breath sounds clear and equal without increased work of breathing  Cardiovascular: Regular rate and rhythm without murmur. No JVD or edema. Peripheral pulses intact.  Abdomen: Nondistended. Soft and nontender. No masses palpable. No organomegaly. No palpable hernias.  Extremities: No edema or joint swelling or deformity. No chronic venous stasis changes.  Neurologic: Alert and fully oriented. Gait normal.  Laboratory data:  CBC:  Lab Results   Component  Value  Date    WBC  10.8*  12/31/2013    WBC  6.1  12/21/2009    RBC  5.98*  12/31/2013    RBC  5.39*  12/21/2009    HGB  14.7  12/31/2013    HGB  13.8  12/21/2009    HCT  46.5  12/31/2013    HCT  43.3  12/21/2009    PLT  308  12/31/2013    PLT  275  12/21/2009   ]  [facility] Labs:  Lab Results   Component  Value  Date    NA  141  12/31/2013    NA  140  05/31/2011    K  3.6  12/31/2013    K  4.6  05/31/2011    CL  106  05/31/2011    CO2  25  12/31/2013    CO2  24  05/31/2011    BUN  13.6  12/31/2013    BUN  17  05/31/2011    CREATININE  0.9  12/31/2013    CREATININE  0.83  05/31/2011    CREATININE  0.73  12/21/2009    CALCIUM  9.2  12/31/2013    CALCIUM  9.4  05/31/2011    PROT  7.1  12/31/2013    PROT  6.4  08/11/2013    BILITOT  0.27  12/31/2013    BILITOT  0.4  08/11/2013    BILIDIR  0.1  08/11/2013     ALKPHOS  64  12/31/2013    ALKPHOS  52  08/11/2013    AST  25  12/31/2013    AST  19  08/11/2013    ALT  30  12/31/2013    ALT  24  08/11/2013   Assessment  58 y.o. female with a new diagnosis of cancer of the the left breast upper outer quadrant. Clinical IIB, estrogen receptor  positive, progesterone receptor positive, Her2/Neu protein/oncogene negative and Ki 67 - growth factor low. I discussed with the patient and family members present today initial surgical treatment options. We discussed options of breast conservation with lumpectomy or total mastectomy and sentinal lymph node biopsy/dissection. Options for reconstruction were discussed. She would like to pursue breast conservation and after her MR guided biopsy it appears that the final size of her tumor would be about 2.5 cm. She does have a history of breast cancer her mother her young age as well as some questionable history of ovarian cancer in subsequent genetic testing was negative..  Plan  Needle localized left breast lumpectomy with left axillary sentinel lymph node biopsy under general anesthesia as an outpatient Edward Jolly MD, FACS

## 2014-02-23 NOTE — Progress Notes (Signed)
Radiology staff conpleted nuclear med injections.  Pt. Tolerated well. Vital signs stable.  Sedation given. See MAR.

## 2014-02-23 NOTE — Anesthesia Postprocedure Evaluation (Signed)
  Anesthesia Post-op Note  Patient: Michelle Kennedy  Procedure(s) Performed: Procedure(s): BREAST LUMPECTOMY WITH NEEDLE LOCALIZATION AND AXILLARY SENTINEL LYMPH NODE BIOPSY (Left)  Patient Location: PACU  Anesthesia Type:General  Level of Consciousness: awake and alert   Airway and Oxygen Therapy: Patient Spontanous Breathing  Post-op Pain: mild  Post-op Assessment: Post-op Vital signs reviewed, Patient's Cardiovascular Status Stable and Respiratory Function Stable  Post-op Vital Signs: Reviewed  Filed Vitals:   02/23/14 1630  BP: 142/69  Pulse: 97  Temp:   Resp: 16    Complications: No apparent anesthesia complications

## 2014-02-23 NOTE — Anesthesia Preprocedure Evaluation (Signed)
Anesthesia Evaluation  Patient identified by MRN, date of birth, ID band Patient awake    Reviewed: Allergy & Precautions, H&P , NPO status , Patient's Chart, lab work & pertinent test results  Airway       Dental   Pulmonary asthma ,          Cardiovascular negative cardio ROS      Neuro/Psych  Headaches, Depression  Neuromuscular disease    GI/Hepatic GERD-  ,  Endo/Other  Hypothyroidism Morbid obesity  Renal/GU      Musculoskeletal  (+) Arthritis -, Fibromyalgia -  Abdominal   Peds  Hematology   Anesthesia Other Findings   Reproductive/Obstetrics                           Anesthesia Physical Anesthesia Plan  ASA: III  Anesthesia Plan: General   Post-op Pain Management:    Induction: Intravenous  Airway Management Planned: LMA  Additional Equipment:   Intra-op Plan:   Post-operative Plan:   Informed Consent: I have reviewed the patients History and Physical, chart, labs and discussed the procedure including the risks, benefits and alternatives for the proposed anesthesia with the patient or authorized representative who has indicated his/her understanding and acceptance.     Plan Discussed with: CRNA, Anesthesiologist and Surgeon  Anesthesia Plan Comments:         Anesthesia Quick Evaluation

## 2014-02-23 NOTE — Transfer of Care (Signed)
Immediate Anesthesia Transfer of Care Note  Patient: Michelle Kennedy  Procedure(s) Performed: Procedure(s): BREAST LUMPECTOMY WITH NEEDLE LOCALIZATION AND AXILLARY SENTINEL LYMPH NODE BIOPSY (Left)  Patient Location: PACU  Anesthesia Type:General  Level of Consciousness: awake and sedated  Airway & Oxygen Therapy: Patient Spontanous Breathing and Patient connected to face mask oxygen  Post-op Assessment: Report given to PACU RN and Post -op Vital signs reviewed and stable  Post vital signs: Reviewed and stable  Complications: No apparent anesthesia complications

## 2014-02-24 ENCOUNTER — Encounter (HOSPITAL_COMMUNITY): Payer: BC Managed Care – PPO

## 2014-02-24 ENCOUNTER — Encounter (HOSPITAL_BASED_OUTPATIENT_CLINIC_OR_DEPARTMENT_OTHER): Payer: Self-pay | Admitting: General Surgery

## 2014-02-25 LAB — POCT HEMOGLOBIN-HEMACUE: Hemoglobin: 10.5 g/dL — ABNORMAL LOW (ref 12.0–15.0)

## 2014-02-27 ENCOUNTER — Telehealth: Payer: Self-pay

## 2014-02-27 ENCOUNTER — Telehealth (INDEPENDENT_AMBULATORY_CARE_PROVIDER_SITE_OTHER): Payer: Self-pay | Admitting: General Surgery

## 2014-02-27 NOTE — Telephone Encounter (Signed)
Called the patient and discussed the path report

## 2014-02-27 NOTE — Telephone Encounter (Signed)
Michelle Kennedy [phone]  Michelle called, she stated that she had a lump ectomy on Monday and she is coughing now, she has been using her nebulizer, and her lungs are not opening up.

## 2014-02-27 NOTE — Telephone Encounter (Signed)
She really needs office evaluation given that she just had anesthesia and could have pneumonia.  Can we get her in this afternoon at Saturday clinic please?  If not she should be evaluated in Urgent Care.

## 2014-02-27 NOTE — Telephone Encounter (Signed)
Notified pt and she voices understanding. States she will call her husband and decide which urgent she will go to.

## 2014-03-04 ENCOUNTER — Encounter: Payer: Self-pay | Admitting: Radiation Oncology

## 2014-03-06 ENCOUNTER — Telehealth (INDEPENDENT_AMBULATORY_CARE_PROVIDER_SITE_OTHER): Payer: Self-pay | Admitting: General Surgery

## 2014-03-06 NOTE — Telephone Encounter (Signed)
She called stating she is having recurrent drainage from her axillary lymph node biopsy site despite the steri strips placed by Dr. Excell Seltzer two days ago.  I instructed her to keep the area clean and dressed with a heavy absorbent pad and it would likely stop draining over the next 2-3 days.  It if got worse this weekend, I told her to go to the ED.  If it is still draining on Monday, I instructed her to call the office and arrange to be seen.

## 2014-03-08 ENCOUNTER — Other Ambulatory Visit: Payer: Self-pay | Admitting: Family

## 2014-03-10 ENCOUNTER — Ambulatory Visit (HOSPITAL_BASED_OUTPATIENT_CLINIC_OR_DEPARTMENT_OTHER): Payer: BC Managed Care – PPO | Admitting: Hematology and Oncology

## 2014-03-10 ENCOUNTER — Telehealth: Payer: Self-pay | Admitting: Hematology and Oncology

## 2014-03-10 ENCOUNTER — Encounter: Payer: Self-pay | Admitting: *Deleted

## 2014-03-10 VITALS — BP 160/89 | HR 86 | Temp 97.9°F | Resp 18 | Ht 68.0 in | Wt 262.9 lb

## 2014-03-10 DIAGNOSIS — C50412 Malignant neoplasm of upper-outer quadrant of left female breast: Secondary | ICD-10-CM

## 2014-03-10 NOTE — Progress Notes (Signed)
Patient Care Team: Debbrah Alar, NP as PCP - General (Internal Medicine) Excell Seltzer, MD as Consulting Physician (General Surgery) Rulon Eisenmenger, MD as Consulting Physician (Hematology and Oncology) [name], MD as Attending Physician (Radiation Oncology)  DIAGNOSIS: Breast cancer of upper-outer quadrant of left female breast   Primary site: Breast (Left)   Staging method: AJCC 7th Edition   Clinical: Stage IIB (T3, N0, cM0)   Pathologic: Stage IIB (T2, N1a, cM0) signed by Rulon Eisenmenger, MD on 03/10/2014 10:23 AM   Summary: Stage IIB (T2, N1a, cM0)   Clinical comments: Staged at breast conference 12/31/13.   SUMMARY OF ONCOLOGIC HISTORY:   Breast cancer of upper-outer quadrant of left female breast   12/22/2013 Mammogram Left breast upper-outer quadrant 1.8 x 1.4 x 1.6 cm mass with left axillary lymph node measuring 3.6 mm   12/30/2013 Breast MRI Dominant enhancing left upper outer quadrant irregular mass encompassing a confluent area of abnormal clumped nodular enhancement, 5.5 cm overall, 6 mm masslike enhancement central left breast     02/23/2014 Surgery Left lumpectomy: Invasive ductal carcinoma grade 2 spending 3.8 cm intermediate grade DCIS with lymphovascular invasion margins negative one out of 4 SLN positive, ER 99%, PR 100%, HER-2 negative ratio 0.74, T2, N1, M0 stage IIB    CHIEF COMPLIANT: Followup after surgery to discuss adjuvant therapy options  INTERVAL HISTORY: 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 After surgery she has noticed that there was a small opening in the axilla where should sentinel lymph nodes not which started to get bigger and was oozing serous sanguinous material recently. She has been able to keep it clean but is worried that it might get infected. Other than that she is without any complaints or  concerns. She is here today accompanied by her husband.  REVIEW OF SYSTEMS:   Constitutional: Denies fevers, chills or abnormal weight loss Eyes: Denies blurriness of vision Ears, nose, mouth, throat, and face: Denies mucositis or sore throat Respiratory: Denies cough, dyspnea or wheezes Cardiovascular: Denies palpitation, chest discomfort or lower extremity swelling Gastrointestinal:  Denies nausea, heartburn or change in bowel habits Skin: Denies abnormal skin rashes Lymphatics: Denies new lymphadenopathy or easy bruising Neurological:Denies numbness, tingling or new weaknesses Behavioral/Psych: Mood is stable, no new changes  Breast: Left axilla open wound All other systems were reviewed with the patient and are negative.  I have reviewed the past medical history, past surgical history, social history and family history with the patient and they are unchanged from previous note.  ALLERGIES:  is allergic to bee venom; contrast media; iodine; latex; penicillins; shellfish allergy; aspirin; erythromycin; lidocaine; symbicort; codeine; pentazocine lactate; and sulfonamide derivatives.  MEDICATIONS:  Current Outpatient Prescriptions  Medication Sig Dispense Refill  . albuterol (PROVENTIL HFA;VENTOLIN HFA) 108 (90 BASE) MCG/ACT inhaler Inhale 2 puffs into the lungs every 6 (six) hours as needed for wheezing or shortness of breath. Please dispense Proair  8.5 g  5  . albuterol (PROVENTIL) (5 MG/ML) 0.5% nebulizer solution Take 2.5 mg by nebulization every 6 (six) hours as needed for wheezing or shortness of breath.      . benzonatate (TESSALON) 200 MG capsule Take 1 capsule (200 mg total) by mouth 3 (three) times daily as needed for cough.  30 capsule  1  . Calcium Carbonate (CALCIUM 500 PO) Take 1 tablet by mouth  2 (two) times daily.        . cetirizine (ZYRTEC) 10 MG tablet Take 10 mg by mouth daily.      . diphenhydrAMINE (BENADRYL) 25 MG tablet Take 1 tablet an hour before MRI.  Then take  1 tablet at bedtime as needed, per your usual regimen.  30 tablet  0  . DULoxetine (CYMBALTA) 60 MG capsule Take 1 capsule (60 mg total) by mouth daily.  90 capsule  1  . EPINEPHrine 0.3 mg/0.3 mL IJ SOAJ injection Inject 0.3 mLs (0.3 mg total) into the muscle once.  2 Device  0  . Ergocalciferol (VITAMIN D2) 400 UNITS TABS Take 1 tablet by mouth daily.      . Fluticasone-Salmeterol (ADVAIR DISKUS) 250-50 MCG/DOSE AEPB Inhale 1 puff into the lungs 2 (two) times daily.  180 each  1  . levothyroxine (SYNTHROID, LEVOTHROID) 150 MCG tablet Take 1 tablet (150 mcg total) by mouth daily.  30 tablet  1  . meloxicam (MOBIC) 7.5 MG tablet TAKE 1 TABLET DAILY  90 tablet  0  . methylPREDNISolone (MEDROL) 4 MG tablet       . Multiple Vitamin (MULTIVITAMIN) tablet Take 1 tablet by mouth daily.        . Omega-3 Fatty Acids (FISH OIL) 1200 MG CAPS Take 1 capsule by mouth daily.        Marland Kitchen omeprazole (PRILOSEC) 40 MG capsule TAKE ONE CAPSULE BY MOUTH EVERY DAY  90 capsule  3  . oxybutynin (DITROPAN-XL) 5 MG 24 hr tablet Take 5 mg by mouth daily.      Marland Kitchen oxyCODONE-acetaminophen (ROXICET) 5-325 MG per tablet 1-2 tablets q 4h prn  40 tablet  0  . simvastatin (ZOCOR) 40 MG tablet TAKE 1 TABLET (40MG) BY MOUTH DAILY  90 tablet  1  . traMADol (ULTRAM) 50 MG tablet        No current facility-administered medications for this visit.    PHYSICAL EXAMINATION: ECOG PERFORMANCE STATUS: 1 - Symptomatic but completely ambulatory  Filed Vitals:   03/10/14 1013  BP: 160/89  Pulse: 86  Temp: 97.9 F (36.6 C)  Resp: 18   Filed Weights   03/10/14 1013  Weight: 262 lb 14.4 oz (119.251 kg)    GENERAL:alert, no distress and comfortable SKIN: skin color, texture, turgor are normal, no rashes or significant lesions EYES: normal, Conjunctiva are pink and non-injected, sclera clear OROPHARYNX:no exudate, no erythema and lips, buccal mucosa, and tongue normal  NECK: supple, thyroid normal size, non-tender, without  nodularity LYMPH:  no palpable lymphadenopathy in the cervical, axillary or inguinal LUNGS: clear to auscultation and percussion with normal breathing effort HEART: regular rate & rhythm and no murmurs and no lower extremity edema ABDOMEN:abdomen soft, non-tender and normal bowel sounds Musculoskeletal:no cyanosis of digits and no clubbing  NEURO: alert & oriented x 3 with fluent speech, no focal motor/sensory deficits BREAST: Left axilla open wound was examined there does not appear to be any cellulitis or infection. It is a clean wound.  LABORATORY DATA:  I have reviewed the data as listed   Chemistry      Component Value Date/Time   NA 141 12/31/2013 1256   NA 140 05/31/2011 0905   K 3.6 12/31/2013 1256   K 4.6 05/31/2011 0905   CL 106 05/31/2011 0905   CO2 25 12/31/2013 1256   CO2 24 05/31/2011 0905   BUN 13.6 12/31/2013 1256   BUN 17 05/31/2011 0905   CREATININE 0.9 12/31/2013 1256  CREATININE 0.83 05/31/2011 0905   CREATININE 0.73 12/21/2009 1937      Component Value Date/Time   CALCIUM 9.2 12/31/2013 1256   CALCIUM 9.4 05/31/2011 0905   ALKPHOS 64 12/31/2013 1256   ALKPHOS 52 08/11/2013 0918   AST 25 12/31/2013 1256   AST 19 08/11/2013 0918   ALT 30 12/31/2013 1256   ALT 24 08/11/2013 0918   BILITOT 0.27 12/31/2013 1256   BILITOT 0.4 08/11/2013 0918       Lab Results  Component Value Date   WBC 10.8* 12/31/2013   HGB 10.5* 02/23/2014   HCT 46.5 12/31/2013   MCV 77.7* 12/31/2013   PLT 308 12/31/2013   NEUTROABS 6.6* 12/31/2013     RADIOGRAPHIC STUDIES: I have personally reviewed the radiology reports and agreed with their findings. No results found.   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 M0 stage IIB  I discussed the final pathology report with her in great detail including the staging  process as well as the high risk features of her cancer. Because of lymph node positive disease, I do not recommend sending for Oncotype DX testing. I recommended systemic adjuvant chemotherapy.  Chemotherapy counseling: ACX4 followed by Abraxane weekly X 12 I have discussed the risks and benefits of chemotherapy including the risks of nausea/ vomiting, risk of infection from low WBC count, fatigue due to chemo or anemia, bruising or bleeding due to low platelets, mouth sores, loss/ change in taste and decreased appetite. Liver and kidney function will be monitored through out chemotherapy as abnormalities in liver and kidney function may be a side effect of treatment.Cardiac dysfunction due to Adriamycin was discussed in detail. Risk of permanent bone marrow dysfunction and leukemia due to chemo were also discussed.  Plan: I would like to order the following to get her ready for chemotherapy 1. PET/CT for completing staging 2. port placement: We'll request Dr. Excell Seltzer 3. Echocardiogram 4. chemotherapy education class  Axillary sentinel lymph node area wound dehiscence: I discussed with Dr. Excell Seltzer was willing to see her this afternoon to manage the problem.    Orders Placed This Encounter  Procedures  . CBC with Differential    Standing Status: Future     Number of Occurrences:      Standing Expiration Date: 03/10/2015  . Comprehensive metabolic panel (Cmet) - [facility]    Standing Status: Future     Number of Occurrences:      Standing Expiration Date: 03/10/2015  . Ambulatory referral to Social Work    Referral Priority:  Routine    Referral Type:  Consultation    Referral Reason:  Specialty Services Required    Number of Visits Requested:  1   The patient has a good understanding of the overall plan. she agrees with it. She will call with any problems that may develop before her next visit here.  I spent 25 minutes counseling the patient face to face. The total time spent in the  appointment was 30 minutes and more than 50% was on counseling and review of test results    Rulon Eisenmenger, MD 03/10/2014 2:00 PM

## 2014-03-10 NOTE — Telephone Encounter (Signed)
, °

## 2014-03-10 NOTE — Assessment & Plan Note (Signed)
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 M0 stage IIB  I discussed the final pathology report with her in great detail including the staging process as well as the high risk features of her cancer. Because of lymph node positive disease, I do not recommend sending for Oncotype DX testing. I recommended systemic adjuvant chemotherapy.  Chemotherapy counseling: ACX4 followed by Abraxane weekly X 12 I have discussed the risks and benefits of chemotherapy including the risks of nausea/ vomiting, risk of infection from low WBC count, fatigue due to chemo or anemia, bruising or bleeding due to low platelets, mouth sores, loss/ change in taste and decreased appetite. Liver and kidney function will be monitored through out chemotherapy as abnormalities in liver and kidney function may be a side effect of treatment.Cardiac dysfunction due to Adriamycin was discussed in detail. Risk of permanent bone marrow dysfunction and leukemia due to chemo were also discussed.  Plan: I would like to order the following to get her ready for chemotherapy 1. PET/CT for completing staging 2. port placement: We'll request Dr. Excell Seltzer 3. Echocardiogram 4. chemotherapy education class  Axillary sentinel lymph node area wound dehiscence: I discussed with Dr. Excell Seltzer was willing to see her this afternoon to manage the problem.

## 2014-03-10 NOTE — Progress Notes (Signed)
Per Dr. Lindi Adie pt will need PET scan, ECHO, Port, chemo class.  Pt needs follow up with Dr Excell Seltzer for open surgical site.  Info provided to navigators for coordination and scheduling.   Md created note sent to scan.  Copy to patient.

## 2014-03-11 ENCOUNTER — Telehealth: Payer: Self-pay | Admitting: *Deleted

## 2014-03-11 NOTE — Telephone Encounter (Signed)
Per staff message and POF I have scheduled appts. Advised scheduler of appts. JMW  

## 2014-03-13 ENCOUNTER — Telehealth: Payer: Self-pay | Admitting: Hematology and Oncology

## 2014-03-13 NOTE — Telephone Encounter (Signed)
, °

## 2014-03-16 NOTE — Progress Notes (Signed)
03-04-14 Pt discussed at tumor board.   We do not feel that there is any need for further surgery.  -----------------------------------  [name], MD

## 2014-03-19 ENCOUNTER — Encounter (HOSPITAL_COMMUNITY)
Admission: RE | Admit: 2014-03-19 | Discharge: 2014-03-19 | Disposition: A | Discharge status: Home / Self Care | Payer: BC Managed Care – PPO | Source: Ambulatory Visit | Attending: Hematology and Oncology | Admitting: Hematology and Oncology

## 2014-03-19 ENCOUNTER — Encounter: Payer: Self-pay | Admitting: *Deleted

## 2014-03-19 ENCOUNTER — Ambulatory Visit (HOSPITAL_COMMUNITY)
Admission: RE | Admit: 2014-03-19 | Discharge: 2014-03-19 | Disposition: A | Discharge status: Home / Self Care | Payer: BC Managed Care – PPO | Source: Ambulatory Visit | Attending: Hematology and Oncology | Admitting: Hematology and Oncology

## 2014-03-19 ENCOUNTER — Encounter (HOSPITAL_COMMUNITY): Payer: Self-pay

## 2014-03-19 ENCOUNTER — Other Ambulatory Visit: Payer: BC Managed Care – PPO

## 2014-03-19 ENCOUNTER — Encounter (HOSPITAL_BASED_OUTPATIENT_CLINIC_OR_DEPARTMENT_OTHER): Payer: Self-pay | Admitting: *Deleted

## 2014-03-19 DIAGNOSIS — C50412 Malignant neoplasm of upper-outer quadrant of left female breast: Secondary | ICD-10-CM | POA: Diagnosis not present

## 2014-03-19 DIAGNOSIS — I517 Cardiomegaly: Secondary | ICD-10-CM

## 2014-03-19 LAB — GLUCOSE, CAPILLARY: Glucose-Capillary: 105 mg/dL — ABNORMAL HIGH (ref 70–99)

## 2014-03-19 MED ORDER — FLUDEOXYGLUCOSE F - 18 (FDG) INJECTION
13.0100 | Freq: Once | INTRAVENOUS | Status: AC | PRN
  Administered 2014-03-19: 13.01 via INTRAVENOUS

## 2014-03-19 NOTE — Progress Notes (Signed)
No more labs needed Did have some drg from node site-put on keflex

## 2014-03-19 NOTE — Progress Notes (Signed)
Echocardiogram 2D Echocardiogram has been performed.  Michelle Kennedy 03/19/2014, 10:56 AM

## 2014-03-24 ENCOUNTER — Ambulatory Visit (HOSPITAL_BASED_OUTPATIENT_CLINIC_OR_DEPARTMENT_OTHER): Payer: BC Managed Care – PPO | Admitting: Anesthesiology

## 2014-03-24 ENCOUNTER — Ambulatory Visit (HOSPITAL_COMMUNITY): Payer: BC Managed Care – PPO

## 2014-03-24 ENCOUNTER — Encounter (HOSPITAL_BASED_OUTPATIENT_CLINIC_OR_DEPARTMENT_OTHER): Payer: Self-pay | Admitting: Anesthesiology

## 2014-03-24 ENCOUNTER — Encounter (HOSPITAL_BASED_OUTPATIENT_CLINIC_OR_DEPARTMENT_OTHER)
Admission: RE | Disposition: A | Discharge status: Home / Self Care | Payer: Self-pay | Source: Ambulatory Visit | Attending: General Surgery

## 2014-03-24 ENCOUNTER — Ambulatory Visit (HOSPITAL_BASED_OUTPATIENT_CLINIC_OR_DEPARTMENT_OTHER)
Admission: RE | Admit: 2014-03-24 | Discharge: 2014-03-24 | Disposition: A | Discharge status: Home / Self Care | Payer: BC Managed Care – PPO | Source: Ambulatory Visit | Attending: General Surgery | Admitting: General Surgery

## 2014-03-24 DIAGNOSIS — C50412 Malignant neoplasm of upper-outer quadrant of left female breast: Secondary | ICD-10-CM

## 2014-03-24 DIAGNOSIS — Z95828 Presence of other vascular implants and grafts: Secondary | ICD-10-CM

## 2014-03-24 DIAGNOSIS — C50912 Malignant neoplasm of unspecified site of left female breast: Secondary | ICD-10-CM | POA: Insufficient documentation

## 2014-03-24 DIAGNOSIS — J45909 Unspecified asthma, uncomplicated: Secondary | ICD-10-CM | POA: Insufficient documentation

## 2014-03-24 DIAGNOSIS — E039 Hypothyroidism, unspecified: Secondary | ICD-10-CM | POA: Insufficient documentation

## 2014-03-24 DIAGNOSIS — M797 Fibromyalgia: Secondary | ICD-10-CM | POA: Insufficient documentation

## 2014-03-24 DIAGNOSIS — C50919 Malignant neoplasm of unspecified site of unspecified female breast: Secondary | ICD-10-CM | POA: Diagnosis present

## 2014-03-24 HISTORY — PX: PORTACATH PLACEMENT: SHX2246

## 2014-03-24 LAB — POCT HEMOGLOBIN-HEMACUE: Hemoglobin: 14.2 g/dL (ref 12.0–15.0)

## 2014-03-24 SURGERY — INSERTION, TUNNELED CENTRAL VENOUS DEVICE, WITH PORT
Anesthesia: General | Site: Chest

## 2014-03-24 MED ORDER — HEPARIN SOD (PORK) LOCK FLUSH 100 UNIT/ML IV SOLN
INTRAVENOUS | Status: DC | PRN
  Administered 2014-03-24: 500 [IU] via INTRAVENOUS

## 2014-03-24 MED ORDER — DIPHENHYDRAMINE HCL 50 MG/ML IJ SOLN
12.5000 mg | Freq: Once | INTRAMUSCULAR | Status: AC
  Administered 2014-03-24: 12.5 mg via INTRAVENOUS

## 2014-03-24 MED ORDER — CIPROFLOXACIN IN D5W 400 MG/200ML IV SOLN
INTRAVENOUS | Status: DC | PRN
  Administered 2014-03-24: 400 mg via INTRAVENOUS

## 2014-03-24 MED ORDER — BUPIVACAINE-EPINEPHRINE (PF) 0.25% -1:200000 IJ SOLN
INTRAMUSCULAR | Status: AC
  Filled 2014-03-24: qty 30

## 2014-03-24 MED ORDER — FENTANYL CITRATE 0.05 MG/ML IJ SOLN: 25.0000 ug | INTRAMUSCULAR | Status: DC | PRN

## 2014-03-24 MED ORDER — MIDAZOLAM HCL 2 MG/2ML IJ SOLN
INTRAMUSCULAR | Status: AC
  Filled 2014-03-24: qty 2

## 2014-03-24 MED ORDER — PROPOFOL 10 MG/ML IV BOLUS
INTRAVENOUS | Status: AC
  Filled 2014-03-24: qty 20

## 2014-03-24 MED ORDER — OXYCODONE HCL 5 MG PO TABS
ORAL_TABLET | ORAL | Status: AC
  Filled 2014-03-24: qty 1

## 2014-03-24 MED ORDER — FENTANYL CITRATE 0.05 MG/ML IJ SOLN
INTRAMUSCULAR | Status: AC
  Filled 2014-03-24: qty 4

## 2014-03-24 MED ORDER — PROPOFOL 10 MG/ML IV BOLUS
INTRAVENOUS | Status: DC | PRN
  Administered 2014-03-24: 200 mg via INTRAVENOUS

## 2014-03-24 MED ORDER — HEPARIN (PORCINE) IN NACL 2-0.9 UNIT/ML-% IJ SOLN
INTRAMUSCULAR | Status: DC | PRN
  Administered 2014-03-24: 1 via INTRAVENOUS

## 2014-03-24 MED ORDER — MIDAZOLAM HCL 5 MG/5ML IJ SOLN
INTRAMUSCULAR | Status: DC | PRN
  Administered 2014-03-24: 2 mg via INTRAVENOUS

## 2014-03-24 MED ORDER — OXYCODONE HCL 5 MG PO TABS
5.0000 mg | ORAL_TABLET | ORAL | Status: DC | PRN
  Administered 2014-03-24: 5 mg via ORAL

## 2014-03-24 MED ORDER — ONDANSETRON HCL 4 MG/2ML IJ SOLN
INTRAMUSCULAR | Status: DC | PRN
  Administered 2014-03-24: 4 mg via INTRAVENOUS

## 2014-03-24 MED ORDER — LACTATED RINGERS IV SOLN
INTRAVENOUS | Status: DC
  Administered 2014-03-24 (×2): via INTRAVENOUS

## 2014-03-24 MED ORDER — HEPARIN SOD (PORK) LOCK FLUSH 100 UNIT/ML IV SOLN
INTRAVENOUS | Status: AC
  Filled 2014-03-24: qty 5

## 2014-03-24 MED ORDER — DEXAMETHASONE SODIUM PHOSPHATE 4 MG/ML IJ SOLN
INTRAMUSCULAR | Status: DC | PRN
Start: 2014-03-24 — End: 2014-03-24
  Administered 2014-03-24: 10 mg via INTRAVENOUS

## 2014-03-24 MED ORDER — OXYCODONE-ACETAMINOPHEN 5-325 MG PO TABS: ORAL_TABLET | ORAL | Status: DC

## 2014-03-24 MED ORDER — GLYCOPYRROLATE 0.2 MG/ML IJ SOLN
INTRAMUSCULAR | Status: DC | PRN
  Administered 2014-03-24: 0.2 mg via INTRAVENOUS

## 2014-03-24 MED ORDER — CIPROFLOXACIN IN D5W 400 MG/200ML IV SOLN
INTRAVENOUS | Status: AC
  Filled 2014-03-24: qty 200

## 2014-03-24 MED ORDER — DIPHENHYDRAMINE HCL 50 MG/ML IJ SOLN
INTRAMUSCULAR | Status: AC
  Filled 2014-03-24: qty 1

## 2014-03-24 MED ORDER — HEPARIN (PORCINE) IN NACL 2-0.9 UNIT/ML-% IJ SOLN
INTRAMUSCULAR | Status: AC
  Filled 2014-03-24: qty 500

## 2014-03-24 MED ORDER — FENTANYL CITRATE 0.05 MG/ML IJ SOLN
INTRAMUSCULAR | Status: DC | PRN
  Administered 2014-03-24 (×2): 25 ug via INTRAVENOUS
  Administered 2014-03-24: 100 ug via INTRAVENOUS

## 2014-03-24 MED ORDER — LIDOCAINE HCL (CARDIAC) 20 MG/ML IV SOLN
INTRAVENOUS | Status: DC | PRN
  Administered 2014-03-24: 60 mg via INTRAVENOUS

## 2014-03-24 SURGICAL SUPPLY — 44 items
BAG DECANTER FOR FLEXI CONT (MISCELLANEOUS) ×3 IMPLANT
BANDAGE ADH SHEER 1  50/CT (GAUZE/BANDAGES/DRESSINGS) ×3 IMPLANT
BLADE SURG 15 STRL LF DISP TIS (BLADE) ×1 IMPLANT
BLADE SURG 15 STRL SS (BLADE) ×2
CHLORAPREP W/TINT 26ML (MISCELLANEOUS) ×3 IMPLANT
COVER BACK TABLE 60X90IN (DRAPES) ×3 IMPLANT
COVER MAYO STAND STRL (DRAPES) ×3 IMPLANT
DECANTER SPIKE VIAL GLASS SM (MISCELLANEOUS) IMPLANT
DRAPE C-ARM 42X72 X-RAY (DRAPES) ×3 IMPLANT
DRAPE LAPAROTOMY T 102X78X121 (DRAPES) IMPLANT
DRAPE LAPAROTOMY TRNSV 102X78 (DRAPE) ×3 IMPLANT
DRAPE UTILITY XL STRL (DRAPES) ×3 IMPLANT
ELECT REM PT RETURN 9FT ADLT (ELECTROSURGICAL) ×3
ELECTRODE REM PT RTRN 9FT ADLT (ELECTROSURGICAL) ×1 IMPLANT
GLOVE BIOGEL PI IND STRL 7.0 (GLOVE) ×2 IMPLANT
GLOVE BIOGEL PI IND STRL 8 (GLOVE) ×1 IMPLANT
GLOVE BIOGEL PI INDICATOR 7.0 (GLOVE) ×4
GLOVE BIOGEL PI INDICATOR 8 (GLOVE) ×2
GLOVE SS BIOGEL STRL SZ 7.5 (GLOVE) IMPLANT
GLOVE SUPERSENSE BIOGEL SZ 7.5 (GLOVE)
GLOVE SURG SS PI 6.5 STRL IVOR (GLOVE) ×3 IMPLANT
GOWN STRL REUS W/ TWL LRG LVL3 (GOWN DISPOSABLE) ×1 IMPLANT
GOWN STRL REUS W/ TWL XL LVL3 (GOWN DISPOSABLE) ×1 IMPLANT
GOWN STRL REUS W/TWL LRG LVL3 (GOWN DISPOSABLE) ×2
GOWN STRL REUS W/TWL XL LVL3 (GOWN DISPOSABLE) ×2
IV CATH PLACEMENT UNIT 16 GA (IV SOLUTION) IMPLANT
IV KIT MINILOC 20X1 SAFETY (NEEDLE) IMPLANT
KIT BARDPORT ISP (Port) IMPLANT
KIT PORT POWER 8FR ISP CVUE (Catheter) ×3 IMPLANT
LIQUID BAND (GAUZE/BANDAGES/DRESSINGS) ×3 IMPLANT
NEEDLE HYPO 22GX1.5 SAFETY (NEEDLE) IMPLANT
NEEDLE HYPO 25X1 1.5 SAFETY (NEEDLE) ×3 IMPLANT
PACK BASIN DAY SURGERY FS (CUSTOM PROCEDURE TRAY) ×3 IMPLANT
PENCIL BUTTON HOLSTER BLD 10FT (ELECTRODE) ×3 IMPLANT
SET SHEATH INTRODUCER 10FR (MISCELLANEOUS) IMPLANT
SHEATH COOK PEEL AWAY SET 9F (SHEATH) IMPLANT
SLEEVE SCD COMPRESS KNEE MED (MISCELLANEOUS) ×3 IMPLANT
SUT MON AB 4-0 PC3 18 (SUTURE) ×3 IMPLANT
SUT PROLENE 2 0 CT2 30 (SUTURE) ×3 IMPLANT
SUT SILK 4 0 TIES 17X18 (SUTURE) IMPLANT
SYR 5ML LUER SLIP (SYRINGE) ×3 IMPLANT
SYR CONTROL 10ML LL (SYRINGE) ×3 IMPLANT
TOWEL OR 17X24 6PK STRL BLUE (TOWEL DISPOSABLE) ×3 IMPLANT
TOWEL OR NON WOVEN STRL DISP B (DISPOSABLE) ×3 IMPLANT

## 2014-03-24 NOTE — Anesthesia Preprocedure Evaluation (Signed)
Anesthesia Evaluation  Patient identified by MRN, date of birth, ID band Patient awake    Reviewed: Allergy & Precautions, H&P , NPO status , Patient's Chart, lab work & pertinent test results  Airway Mallampati: II       Dental   Pulmonary asthma ,  breath sounds clear to auscultation        Cardiovascular negative cardio ROS  Rhythm:Regular Rate:Normal     Neuro/Psych    GI/Hepatic Neg liver ROS, GERD-  ,  Endo/Other  Hypothyroidism   Renal/GU negative Renal ROS     Musculoskeletal  (+) Arthritis -, Fibromyalgia -  Abdominal   Peds  Hematology   Anesthesia Other Findings   Reproductive/Obstetrics                             Anesthesia Physical Anesthesia Plan  ASA: III  Anesthesia Plan: General   Post-op Pain Management:    Induction: Intravenous  Airway Management Planned: LMA  Additional Equipment:   Intra-op Plan:   Post-operative Plan: Extubation in OR  Informed Consent: I have reviewed the patients History and Physical, chart, labs and discussed the procedure including the risks, benefits and alternatives for the proposed anesthesia with the patient or authorized representative who has indicated his/her understanding and acceptance.   Dental advisory given  Plan Discussed with: CRNA, Anesthesiologist and Surgeon  Anesthesia Plan Comments:         Anesthesia Quick Evaluation

## 2014-03-24 NOTE — Transfer of Care (Signed)
Immediate Anesthesia Transfer of Care Note  Patient: Michelle Kennedy  Procedure(s) Performed: Procedure(s): INSERTION PORT-A-CATH (N/A)  Patient Location: PACU  Anesthesia Type:General  Level of Consciousness: awake, sedated and patient cooperative  Airway & Oxygen Therapy: Patient Spontanous Breathing and Patient connected to face mask oxygen  Post-op Assessment: Report given to PACU RN and Post -op Vital signs reviewed and stable  Post vital signs: Reviewed and stable  Complications: No apparent anesthesia complications

## 2014-03-24 NOTE — Op Note (Signed)
Preoperative diagnosis: Cancer of the breast and the poor venous access  Postoperative diagnosis: Same  Procedure: Placement of ClearVue subcutaneous venous port  Surgeon: Excell Seltzer M.D.  Anesthesia: LMA General  Description of procedure: Patient is brought to the operating room and placed in the supine position on the operating table. IV sedation was administered. The entire upper chest and neck were widely sterilely prepped and draped. Local anesthesia was used to infiltrate the insertion of port site. The right subclavian vein was cannulated with a needle and guidewire without difficulty and position in the superior vena cava was confirmed by fluoroscopy. The introducer was then placed over the guidewire and the flushed catheter placed via the introducer which was stripped away and the tip of the catheter positioned near the cavoatrial junction. A small transverse incision was made in the anterior chest wall and subcutaneous pocket created. The catheter was tunneled into the pocket, trimmed to length, and attached to the flushed port which was positioned in the pocket. The port was sutured to the chest wall with interrupted 2-0 Prolene. The incisions were closed with subcutaneous interrupted Monocryl and the skin incisions closed with subcuticular Monocryl and Dermabond. The port was accessed and flushed and aspirated easily and was left flushed with concentrated heparin solution. Sponge needle as the counts were correct. The patient was taken to recovery in good condition.  Dewel Lotter T  03/24/2014

## 2014-03-24 NOTE — Interval H&P Note (Signed)
History and Physical Interval Note:  03/24/2014 8:20 AM  Michelle Kennedy  has presented today for surgery, with the diagnosis of breast cancer  The various methods of treatment have been discussed with the patient and family. After consideration of risks, benefits and other options for treatment, including bleeding, infection, PTX, and catheter displacement or malfunction the patient has consented to  Procedure(s): INSERTION PORT-A-CATH (N/A) as a surgical intervention .  The patient's history has been reviewed, patient examined, no change in status, stable for surgery.  I have reviewed the patient's chart and labs.  Questions were answered to the patient's satisfaction.     Shaden Lacher T

## 2014-03-24 NOTE — Discharge Instructions (Signed)
° ° °PORT-A-CATH: POST OP INSTRUCTIONS ° °Always review your discharge instruction sheet given to you by the facility where your surgery was performed.  ° °1. A prescription for pain medication may be given to you upon discharge. Take your pain medication as prescribed, if needed. If narcotic pain medicine is not needed, then you make take acetaminophen (Tylenol) or ibuprofen (Advil) as needed.  °2. Take your usually prescribed medications unless otherwise directed. °3. If you need a refill on your pain medication, please contact our office. All narcotic pain medicine now requires a paper prescription.  Phoned in and fax refills are no longer allowed by law.  Prescriptions will not be filled after 5 pm or on weekends.  °4. You should follow a light diet for the remainder of the day after your procedure. °5. Most patients will experience some mild swelling and/or bruising in the area of the incision. It may take several days to resolve. °6. It is common to experience some constipation if taking pain medication after surgery. Increasing fluid intake and taking a stool softener (such as Colace) will usually help or prevent this problem from occurring. A mild laxative (Milk of Magnesia or Miralax) should be taken according to package directions if there are no bowel movements after 48 hours.  °7. Unless discharge instructions indicate otherwise, you may remove your bandages 48 hours after surgery, and you may shower at that time. You may have steri-strips (small white skin tapes) in place directly over the incision.  These strips should be left on the skin for 7-10 days.  If your surgeon used Dermabond (skin glue) on the incision, you may shower in 24 hours.  The glue will flake off over the next 2-3 weeks.  °8. If your port is left accessed at the end of surgery (needle left in port), the dressing cannot get wet and should only by changed by a healthcare professional. When the port is no longer accessed (when the  needle has been removed), follow step 7.   °9. ACTIVITIES:  Limit activity involving your arms for the next 72 hours. Do no strenuous exercise or activity for 1 week. You may drive when you are no longer taking prescription pain medication, you can comfortably wear a seatbelt, and you can maneuver your car. °10.You may need to see your doctor in the office for a follow-up appointment.  Please °      check with your doctor.  °11.When you receive a new Port-a-Cath, you will get a product guide and  °      ID card.  Please keep them in case you need them. ° °WHEN TO CALL YOUR DOCTOR ([phone]): °1. Fever over 101.0 °2. Chills °3. Continued bleeding from incision °4. Increased redness and tenderness at the site °5. Shortness of breath, difficulty breathing ° ° °The clinic staff is available to answer your questions during regular business hours. Please don’t hesitate to call and ask to speak to one of the nurses or medical assistants for clinical concerns. If you have a medical emergency, go to the nearest emergency room or call 911.  A surgeon from [facility] is always on call at the hospital.  ° ° ° °For further information, please visit [email or web address] ° ° °Post Anesthesia Home Care Instructions ° °Activity: °Get plenty of rest for the remainder of the day. A responsible adult should stay with you for 24 hours following the procedure.  °For the next 24 hours, DO NOT: °-Drive a car °-  Operate machinery °-Drink alcoholic beverages °-Take any medication unless instructed by your physician °-Make any legal decisions or sign important papers. ° °Meals: °Start with liquid foods such as gelatin or soup. Progress to regular foods as tolerated. Avoid greasy, spicy, heavy foods. If nausea and/or vomiting occur, drink only clear liquids until the nausea and/or vomiting subsides. Call your physician if vomiting continues. ° °Special Instructions/Symptoms: °Your throat may feel dry or sore from the  anesthesia or the breathing tube placed in your throat during surgery. If this causes discomfort, gargle with warm salt water. The discomfort should disappear within 24 hours. ° ° ° ° ° ° °

## 2014-03-24 NOTE — Anesthesia Postprocedure Evaluation (Signed)
  Anesthesia Post-op Note  Patient: Michelle Kennedy  Procedure(s) Performed: Procedure(s): INSERTION PORT-A-CATH (N/A)  Patient Location: PACU  Anesthesia Type:General  Level of Consciousness: awake  Airway and Oxygen Therapy: Patient Spontanous Breathing  Post-op Pain: mild  Post-op Assessment: Post-op Vital signs reviewed  Post-op Vital Signs: Reviewed  Last Vitals:  Filed Vitals:   03/24/14 0954  BP:   Pulse: 133  Temp:   Resp: 10    Complications: No apparent anesthesia complications

## 2014-03-24 NOTE — H&P (Signed)
  History of Present Illness Marland Kitchen T. Peola Joynt MD; 03/18/2014 4:00 PM) The patient is a 58 year old female who presents for a breast surgery post-op. She returns following left breast lumpectomy and axillary sentinel lymph node biopsy. We had previously reviewed her pathology which showed one of 3 positive lymph nodes and a 3.8 cm tumor with negative margins. Chemotherapy has been recommended and she will need a Port-A-Cath. She has had some lymphatic drainage from her axillary incision and was seen in the office postop and started on antibiotics. It is draining a little less.   Vitals Briant Cedar CMA; 03/18/2014 2:37 PM) 03/18/2014 2:35 PM Weight: 266 lb Height: 68in Body Surface Area: 2.41 m Body Mass Index: 40.44 kg/m Temp.: 98.73F  Pulse: 88 (Regular)  BP: 150/82 (Sitting, Left Arm, Standard)    Physical Exam Marland Kitchen T. Daegen Berrocal MD; 03/18/2014 4:01 PM) Breast Note: Lumpectomy incision well-healed without complications. There is slight separation of the axillary incision with slight clear drainage and no erythema     Assessment & Plan Marland Kitchen T. Garlene Apperson MD; 03/18/2014 4:02 PM) BREAST CANCER, LEFT (174.9  C50.912) Impression: Wound separation of axillary incision is clean and should close and with no problems.  She will need a Port-A-Cath. We'll schedule this as soon as possible as her chemotherapy starts November 11. Current Plans  Follow up in 1 month or as needed

## 2014-03-24 NOTE — Anesthesia Procedure Notes (Signed)
Procedure Name: LMA Insertion Date/Time: 03/24/2014 8:51 AM Performed by: Lyndee Leo Pre-anesthesia Checklist: Patient identified, Emergency Drugs available, Suction available and Patient being monitored Patient Re-evaluated:Patient Re-evaluated prior to inductionOxygen Delivery Method: Circle System Utilized Preoxygenation: Pre-oxygenation with 100% oxygen Intubation Type: IV induction Ventilation: Mask ventilation without difficulty LMA: LMA inserted LMA Size: 4.0 Number of attempts: 1 Airway Equipment and Method: bite block Placement Confirmation: positive ETCO2 Tube secured with: Tape Dental Injury: Teeth and Oropharynx as per pre-operative assessment

## 2014-03-25 ENCOUNTER — Other Ambulatory Visit: Payer: Self-pay | Admitting: Hematology and Oncology

## 2014-03-25 DIAGNOSIS — C50412 Malignant neoplasm of upper-outer quadrant of left female breast: Secondary | ICD-10-CM

## 2014-03-25 MED ORDER — LORAZEPAM 0.5 MG PO TABS: 0.5000 mg | ORAL_TABLET | Freq: Four times a day (QID) | ORAL | Status: DC | PRN

## 2014-03-25 MED ORDER — ONDANSETRON HCL 8 MG PO TABS: 8.0000 mg | ORAL_TABLET | Freq: Two times a day (BID) | ORAL | Status: DC | PRN

## 2014-03-25 MED ORDER — DEXAMETHASONE 4 MG PO TABS: ORAL_TABLET | ORAL | Status: DC

## 2014-03-25 MED ORDER — PROCHLORPERAZINE MALEATE 10 MG PO TABS: 10.0000 mg | ORAL_TABLET | Freq: Four times a day (QID) | ORAL | Status: DC | PRN

## 2014-03-25 MED ORDER — LIDOCAINE-PRILOCAINE 2.5-2.5 % EX CREA: TOPICAL_CREAM | CUTANEOUS | Status: DC

## 2014-03-26 ENCOUNTER — Encounter (HOSPITAL_BASED_OUTPATIENT_CLINIC_OR_DEPARTMENT_OTHER): Payer: Self-pay | Admitting: General Surgery

## 2014-03-27 ENCOUNTER — Encounter: Payer: Self-pay | Admitting: *Deleted

## 2014-03-27 NOTE — Progress Notes (Signed)
[place] Psychosocial Distress Screening Clinical Social Work  Clinical Social Work was referred by distress screening protocol.  The patient scored a 5 on the Psychosocial Distress Thermometer which indicates moderate distress. Clinical Social Worker phoned pt to assess for distress and other psychosocial needs. CSW left vm as pt not available. CSW will continue to be available to pt at future appointments and awaits return call.   [facility] DISTRESS SCREENING 03/10/2014  Screening Type   Distress experienced in past week (1-10) 5  Emotional problem type Nervousness/Anxiety;Adjusting to illness  Information Concerns Type   Physical Problem type   Physician notified of physical symptoms   Referral to clinical psychology   Referral to clinical social work   Referral to dietition   Referral to financial advocate   Referral to support programs   Referral to palliative care   Other     Clinical Social Worker follow up needed: No.  If yes, follow up plan:  See above Loren Racer, [place]  [facility] Phone: [phone] Fax: [phone]

## 2014-03-30 ENCOUNTER — Ambulatory Visit: Payer: BC Managed Care – PPO | Admitting: Family

## 2014-03-30 NOTE — Progress Notes (Signed)
Triage Call Report rcvd from Call A Nurse dtd 03/29/14 7:49 pm.  [facility] records pulled.  Copy to Dr Lindi Adie, original to scan.

## 2014-03-31 ENCOUNTER — Ambulatory Visit (HOSPITAL_BASED_OUTPATIENT_CLINIC_OR_DEPARTMENT_OTHER): Payer: BC Managed Care – PPO

## 2014-03-31 ENCOUNTER — Telehealth: Payer: Self-pay | Admitting: Hematology and Oncology

## 2014-03-31 ENCOUNTER — Other Ambulatory Visit: Payer: Self-pay | Admitting: *Deleted

## 2014-03-31 ENCOUNTER — Other Ambulatory Visit (HOSPITAL_BASED_OUTPATIENT_CLINIC_OR_DEPARTMENT_OTHER): Payer: BC Managed Care – PPO

## 2014-03-31 ENCOUNTER — Ambulatory Visit (HOSPITAL_BASED_OUTPATIENT_CLINIC_OR_DEPARTMENT_OTHER): Payer: BC Managed Care – PPO | Admitting: Hematology and Oncology

## 2014-03-31 VITALS — BP 142/80 | HR 72 | Temp 97.7°F | Resp 20 | Ht 68.0 in | Wt 263.8 lb

## 2014-03-31 DIAGNOSIS — C50412 Malignant neoplasm of upper-outer quadrant of left female breast: Secondary | ICD-10-CM

## 2014-03-31 DIAGNOSIS — J45909 Unspecified asthma, uncomplicated: Secondary | ICD-10-CM

## 2014-03-31 DIAGNOSIS — Z5111 Encounter for antineoplastic chemotherapy: Secondary | ICD-10-CM

## 2014-03-31 DIAGNOSIS — J4520 Mild intermittent asthma, uncomplicated: Secondary | ICD-10-CM

## 2014-03-31 LAB — COMPREHENSIVE METABOLIC PANEL (CC13)
ALBUMIN: 3.3 g/dL — AB (ref 3.5–5.0)
ALT: 30 U/L (ref 0–55)
ANION GAP: 7 meq/L (ref 3–11)
AST: 22 U/L (ref 5–34)
Alkaline Phosphatase: 56 U/L (ref 40–150)
BUN: 17.1 mg/dL (ref 7.0–26.0)
CO2: 28 mEq/L (ref 22–29)
Calcium: 9.4 mg/dL (ref 8.4–10.4)
Chloride: 106 mEq/L (ref 98–109)
Creatinine: 0.9 mg/dL (ref 0.6–1.1)
GLUCOSE: 79 mg/dL (ref 70–140)
POTASSIUM: 4.1 meq/L (ref 3.5–5.1)
SODIUM: 141 meq/L (ref 136–145)
Total Bilirubin: 0.36 mg/dL (ref 0.20–1.20)
Total Protein: 6.3 g/dL — ABNORMAL LOW (ref 6.4–8.3)

## 2014-03-31 LAB — CBC WITH DIFFERENTIAL/PLATELET
BASO%: 1.1 % (ref 0.0–2.0)
Basophils Absolute: 0.1 10*3/uL (ref 0.0–0.1)
EOS ABS: 0.3 10*3/uL (ref 0.0–0.5)
EOS%: 3.8 % (ref 0.0–7.0)
HCT: 43 % (ref 34.8–46.6)
HEMOGLOBIN: 13.3 g/dL (ref 11.6–15.9)
LYMPH%: 36.6 % (ref 14.0–49.7)
MCH: 24 pg — ABNORMAL LOW (ref 25.1–34.0)
MCHC: 31 g/dL — ABNORMAL LOW (ref 31.5–36.0)
MCV: 77.3 fL — AB (ref 79.5–101.0)
MONO#: 0.9 10*3/uL (ref 0.1–0.9)
MONO%: 9.9 % (ref 0.0–14.0)
NEUT%: 48.6 % (ref 38.4–76.8)
NEUTROS ABS: 4.2 10*3/uL (ref 1.5–6.5)
PLATELETS: 278 10*3/uL (ref 145–400)
RBC: 5.56 10*6/uL — ABNORMAL HIGH (ref 3.70–5.45)
RDW: 15.7 % — ABNORMAL HIGH (ref 11.2–14.5)
WBC: 8.7 10*3/uL (ref 3.9–10.3)
lymph#: 3.2 10*3/uL (ref 0.9–3.3)

## 2014-03-31 MED ORDER — DOXORUBICIN HCL CHEMO IV INJECTION 2 MG/ML
60.0000 mg/m2 | Freq: Once | INTRAVENOUS | Status: AC
  Administered 2014-03-31: 144 mg via INTRAVENOUS
  Filled 2014-03-31: qty 72

## 2014-03-31 MED ORDER — HEPARIN SOD (PORK) LOCK FLUSH 100 UNIT/ML IV SOLN
500.0000 [IU] | Freq: Once | INTRAVENOUS | Status: AC | PRN
  Administered 2014-03-31: 500 [IU]
  Filled 2014-03-31: qty 5

## 2014-03-31 MED ORDER — SODIUM CHLORIDE 0.9 % IV SOLN
150.0000 mg | Freq: Once | INTRAVENOUS | Status: AC
  Administered 2014-03-31: 150 mg via INTRAVENOUS
  Filled 2014-03-31: qty 5

## 2014-03-31 MED ORDER — DEXAMETHASONE SODIUM PHOSPHATE 20 MG/5ML IJ SOLN
INTRAMUSCULAR | Status: AC
  Filled 2014-03-31: qty 5

## 2014-03-31 MED ORDER — CYCLOPHOSPHAMIDE CHEMO INJECTION 1 GM
600.0000 mg/m2 | Freq: Once | INTRAMUSCULAR | Status: AC
  Administered 2014-03-31: 1440 mg via INTRAVENOUS
  Filled 2014-03-31: qty 72

## 2014-03-31 MED ORDER — ONDANSETRON HCL 8 MG PO TABS: 8.0000 mg | ORAL_TABLET | Freq: Two times a day (BID) | ORAL | Status: DC | PRN

## 2014-03-31 MED ORDER — DEXAMETHASONE SODIUM PHOSPHATE 20 MG/5ML IJ SOLN
12.0000 mg | Freq: Once | INTRAMUSCULAR | Status: AC
  Administered 2014-03-31: 12 mg via INTRAVENOUS

## 2014-03-31 MED ORDER — LORAZEPAM 0.5 MG PO TABS: 0.5000 mg | ORAL_TABLET | Freq: Four times a day (QID) | ORAL | Status: DC | PRN

## 2014-03-31 MED ORDER — DEXAMETHASONE 4 MG PO TABS: ORAL_TABLET | ORAL | Status: DC

## 2014-03-31 MED ORDER — SODIUM CHLORIDE 0.9 % IJ SOLN
10.0000 mL | INTRAMUSCULAR | Status: DC | PRN
  Administered 2014-03-31: 10 mL
  Filled 2014-03-31: qty 10

## 2014-03-31 MED ORDER — SODIUM CHLORIDE 0.9 % IV SOLN
Freq: Once | INTRAVENOUS | Status: AC
  Administered 2014-03-31: 10:00:00 via INTRAVENOUS

## 2014-03-31 MED ORDER — PROCHLORPERAZINE MALEATE 10 MG PO TABS: 10.0000 mg | ORAL_TABLET | Freq: Four times a day (QID) | ORAL | Status: DC | PRN

## 2014-03-31 MED ORDER — PALONOSETRON HCL INJECTION 0.25 MG/5ML
0.2500 mg | Freq: Once | INTRAVENOUS | Status: AC
  Administered 2014-03-31: 0.25 mg via INTRAVENOUS

## 2014-03-31 MED ORDER — PALONOSETRON HCL INJECTION 0.25 MG/5ML
INTRAVENOUS | Status: AC
  Filled 2014-03-31: qty 5

## 2014-03-31 NOTE — Assessment & Plan Note (Signed)
Shortness of breath over the weekend: Patient was admitted to [facility] clear she had a CT of the chest that revealed no evidence of pulmonary embolism no clear cut etiology identified chest x-rays did not reveal any evidence of cardiopulmonary disease and she was discharged home. She is now on 24-hour oxygen but she plans to slowly wean her off of oxygen. We will have to closely monitor her respiratory status on chemotherapy.

## 2014-03-31 NOTE — Patient Instructions (Addendum)
[facility] Discharge Instructions for Patients Receiving Chemotherapy  Today you received the following chemotherapy agents: Adriamycin, Cytoxan  To help prevent nausea and vomiting after your treatment, we encourage you to take your nausea medication: Zofran 8 mg every 12 hours, Compazine 10 mg every 6 hours as needed.   If you develop nausea and vomiting that is not controlled by your nausea medication, call the clinic.   BELOW ARE SYMPTOMS THAT SHOULD BE REPORTED IMMEDIATELY:  *FEVER GREATER THAN 100.5 F  *CHILLS WITH OR WITHOUT FEVER  NAUSEA AND VOMITING THAT IS NOT CONTROLLED WITH YOUR NAUSEA MEDICATION  *UNUSUAL SHORTNESS OF BREATH  *UNUSUAL BRUISING OR BLEEDING  TENDERNESS IN MOUTH AND THROAT WITH OR WITHOUT PRESENCE OF ULCERS  *URINARY PROBLEMS  *BOWEL PROBLEMS  UNUSUAL RASH Items with * indicate a potential emergency and should be followed up as soon as possible.  Feel free to call the clinic you have any questions or concerns. The clinic phone number is [phone].   Doxorubicin injection What is this medicine? DOXORUBICIN (dox oh ROO bi sin) is a chemotherapy drug. It is used to treat many kinds of cancer like Hodgkin's disease, leukemia, non-Hodgkin's lymphoma, neuroblastoma, sarcoma, and Wilms' tumor. It is also used to treat bladder cancer, breast cancer, lung cancer, ovarian cancer, stomach cancer, and thyroid cancer. This medicine may be used for other purposes; ask your health care provider or pharmacist if you have questions. COMMON BRAND NAME(S): Adriamycin, Adriamycin PFS, Adriamycin RDF, Rubex What should I tell my health care provider before I take this medicine? They need to know if you have any of these conditions: -blood disorders -heart disease, recent heart attack -infection (especially a virus infection such as chickenpox, cold sores, or herpes) -irregular heartbeat -liver disease -recent or ongoing radiation therapy -an  unusual or allergic reaction to doxorubicin, other chemotherapy agents, other medicines, foods, dyes, or preservatives -pregnant or trying to get pregnant -breast-feeding How should I use this medicine? This drug is given as an infusion into a vein. It is administered in a hospital or clinic by a specially trained health care professional. If you have pain, swelling, burning or any unusual feeling around the site of your injection, tell your health care professional right away. Talk to your pediatrician regarding the use of this medicine in children. Special care may be needed. Overdosage: If you think you have taken too much of this medicine contact a poison control center or emergency room at once. NOTE: This medicine is only for you. Do not share this medicine with others. What if I miss a dose? It is important not to miss your dose. Call your doctor or health care professional if you are unable to keep an appointment. What may interact with this medicine? Do not take this medicine with any of the following medications: -cisapride -droperidol -halofantrine -pimozide -zidovudine This medicine may also interact with the following medications: -chloroquine -chlorpromazine -clarithromycin -cyclophosphamide -cyclosporine -erythromycin -medicines for depression, anxiety, or psychotic disturbances -medicines for irregular heart beat like amiodarone, bepridil, dofetilide, encainide, flecainide, propafenone, quinidine -medicines for seizures like ethotoin, fosphenytoin, phenytoin -medicines for nausea, vomiting like dolasetron, ondansetron, palonosetron -medicines to increase blood counts like filgrastim, pegfilgrastim, sargramostim -methadone -methotrexate -pentamidine -progesterone -vaccines -verapamil Talk to your doctor or health care professional before taking any of these medicines: -acetaminophen -aspirin -ibuprofen -ketoprofen -naproxen This list may not describe all  possible interactions. Give your health care provider a list of all the medicines, herbs, non-prescription drugs, or dietary  supplements you use. Also tell them if you smoke, drink alcohol, or use illegal drugs. Some items may interact with your medicine. What should I watch for while using this medicine? Your condition will be monitored carefully while you are receiving this medicine. You will need important blood work done while you are taking this medicine. This drug may make you feel generally unwell. This is not uncommon, as chemotherapy can affect healthy cells as well as cancer cells. Report any side effects. Continue your course of treatment even though you feel ill unless your doctor tells you to stop. Your urine may turn red for a few days after your dose. This is not blood. If your urine is dark or brown, call your doctor. In some cases, you may be given additional medicines to help with side effects. Follow all directions for their use. Call your doctor or health care professional for advice if you get a fever, chills or sore throat, or other symptoms of a cold or flu. Do not treat yourself. This drug decreases your body's ability to fight infections. Try to avoid being around people who are sick. This medicine may increase your risk to bruise or bleed. Call your doctor or health care professional if you notice any unusual bleeding. Be careful brushing and flossing your teeth or using a toothpick because you may get an infection or bleed more easily. If you have any dental work done, tell your dentist you are receiving this medicine. Avoid taking products that contain aspirin, acetaminophen, ibuprofen, naproxen, or ketoprofen unless instructed by your doctor. These medicines may hide a fever. Men and women of childbearing age should use effective birth control methods while using taking this medicine. Do not become pregnant while taking this medicine. There is a potential for serious side  effects to an unborn child. Talk to your health care professional or pharmacist for more information. Do not breast-feed an infant while taking this medicine. Do not let others touch your urine or other body fluids for 5 days after each treatment with this medicine. Caregivers should wear latex gloves to avoid touching body fluids during this time. There is a maximum amount of this medicine you should receive throughout your life. The amount depends on the medical condition being treated and your overall health. Your doctor will watch how much of this medicine you receive in your lifetime. Tell your doctor if you have taken this medicine before. What side effects may I notice from receiving this medicine? Side effects that you should report to your doctor or health care professional as soon as possible: -allergic reactions like skin rash, itching or hives, swelling of the face, lips, or tongue -low blood counts - this medicine may decrease the number of white blood cells, red blood cells and platelets. You may be at increased risk for infections and bleeding. -signs of infection - fever or chills, cough, sore throat, pain or difficulty passing urine -signs of decreased platelets or bleeding - bruising, pinpoint red spots on the skin, black, tarry stools, blood in the urine -signs of decreased red blood cells - unusually weak or tired, fainting spells, lightheadedness -breathing problems -chest pain -fast, irregular heartbeat -mouth sores -nausea, vomiting -pain, swelling, redness at site where injected -pain, tingling, numbness in the hands or feet -swelling of ankles, feet, or hands -unusual bleeding or bruising Side effects that usually do not require medical attention (report to your doctor or health care professional if they continue or are bothersome): -diarrhea -facial flushing -  hair loss -loss of appetite -missed menstrual periods -nail discoloration or damage -red or watery  eyes -red colored urine -stomach upset This list may not describe all possible side effects. Call your doctor for medical advice about side effects. You may report side effects to FDA at 1-800-FDA-1088. Where should I keep my medicine? This drug is given in a hospital or clinic and will not be stored at home. NOTE: This sheet is a summary. It may not cover all possible information. If you have questions about this medicine, talk to your doctor, pharmacist, or health care provider.  2015, [employer]. (2012-09-03 09:54:34)    Cyclophosphamide injection What is this medicine? CYCLOPHOSPHAMIDE (sye kloe FOSS fa mide) is a chemotherapy drug. It slows the growth of cancer cells. This medicine is used to treat many types of cancer like lymphoma, myeloma, leukemia, breast cancer, and ovarian cancer, to name a few. This medicine may be used for other purposes; ask your health care provider or pharmacist if you have questions. COMMON BRAND NAME(S): Cytoxan, Neosar What should I tell my health care provider before I take this medicine? They need to know if you have any of these conditions: -blood disorders -history of other chemotherapy -infection -kidney disease -liver disease -recent or ongoing radiation therapy -tumors in the bone marrow -an unusual or allergic reaction to cyclophosphamide, other chemotherapy, other medicines, foods, dyes, or preservatives -pregnant or trying to get pregnant -breast-feeding How should I use this medicine? This drug is usually given as an injection into a vein or muscle or by infusion into a vein. It is administered in a hospital or clinic by a specially trained health care professional. Talk to your pediatrician regarding the use of this medicine in children. Special care may be needed. Overdosage: If you think you have taken too much of this medicine contact a poison control center or emergency room at once. NOTE: This medicine is only for you.  Do not share this medicine with others. What if I miss a dose? It is important not to miss your dose. Call your doctor or health care professional if you are unable to keep an appointment. What may interact with this medicine? This medicine may interact with the following medications: -amiodarone -amphotericin B -azathioprine -certain antiviral medicines for HIV or AIDS such as protease inhibitors (e.g., indinavir, ritonavir) and zidovudine -certain blood pressure medications such as benazepril, captopril, enalapril, fosinopril, lisinopril, moexipril, monopril, perindopril, quinapril, ramipril, trandolapril -certain cancer medications such as anthracyclines (e.g., daunorubicin, doxorubicin), busulfan, cytarabine, paclitaxel, pentostatin, tamoxifen, trastuzumab -certain diuretics such as chlorothiazide, chlorthalidone, hydrochlorothiazide, indapamide, metolazone -certain medicines that treat or prevent blood clots like warfarin -certain muscle relaxants such as succinylcholine -cyclosporine -etanercept -indomethacin -medicines to increase blood counts like filgrastim, pegfilgrastim, sargramostim -medicines used as general anesthesia -metronidazole -natalizumab This list may not describe all possible interactions. Give your health care provider a list of all the medicines, herbs, non-prescription drugs, or dietary supplements you use. Also tell them if you smoke, drink alcohol, or use illegal drugs. Some items may interact with your medicine. What should I watch for while using this medicine? Visit your doctor for checks on your progress. This drug may make you feel generally unwell. This is not uncommon, as chemotherapy can affect healthy cells as well as cancer cells. Report any side effects. Continue your course of treatment even though you feel ill unless your doctor tells you to stop. Drink water or other fluids as directed. Urinate often, even at night. In some  cases, you may be given  additional medicines to help with side effects. Follow all directions for their use. Call your doctor or health care professional for advice if you get a fever, chills or sore throat, or other symptoms of a cold or flu. Do not treat yourself. This drug decreases your body's ability to fight infections. Try to avoid being around people who are sick. This medicine may increase your risk to bruise or bleed. Call your doctor or health care professional if you notice any unusual bleeding. Be careful brushing and flossing your teeth or using a toothpick because you may get an infection or bleed more easily. If you have any dental work done, tell your dentist you are receiving this medicine. You may get drowsy or dizzy. Do not drive, use machinery, or do anything that needs mental alertness until you know how this medicine affects you. Do not become pregnant while taking this medicine or for 1 year after stopping it. Women should inform their doctor if they wish to become pregnant or think they might be pregnant. Men should not father a child while taking this medicine and for 4 months after stopping it. There is a potential for serious side effects to an unborn child. Talk to your health care professional or pharmacist for more information. Do not breast-feed an infant while taking this medicine. This medicine may interfere with the ability to have a child. This medicine has caused ovarian failure in some women. This medicine has caused reduced sperm counts in some men. You should talk with your doctor or health care professional if you are concerned about your fertility. If you are going to have surgery, tell your doctor or health care professional that you have taken this medicine. What side effects may I notice from receiving this medicine? Side effects that you should report to your doctor or health care professional as soon as possible: -allergic reactions like skin rash, itching or hives, swelling of the  face, lips, or tongue -low blood counts - this medicine may decrease the number of white blood cells, red blood cells and platelets. You may be at increased risk for infections and bleeding. -signs of infection - fever or chills, cough, sore throat, pain or difficulty passing urine -signs of decreased platelets or bleeding - bruising, pinpoint red spots on the skin, black, tarry stools, blood in the urine -signs of decreased red blood cells - unusually weak or tired, fainting spells, lightheadedness -breathing problems -dark urine -dizziness -palpitations -swelling of the ankles, feet, hands -trouble passing urine or change in the amount of urine -weight gain -yellowing of the eyes or skin Side effects that usually do not require medical attention (report to your doctor or health care professional if they continue or are bothersome): -changes in nail or skin color -hair loss -missed menstrual periods -mouth sores -nausea, vomiting This list may not describe all possible side effects. Call your doctor for medical advice about side effects. You may report side effects to FDA at 1-800-FDA-1088. Where should I keep my medicine? This drug is given in a hospital or clinic and will not be stored at home. NOTE: This sheet is a summary. It may not cover all possible information. If you have questions about this medicine, talk to your doctor, pharmacist, or health care provider.  2015, [employer]. (2012-03-22 16:22:58)

## 2014-03-31 NOTE — Assessment & Plan Note (Addendum)
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 M0 stage IIB Chemotherapy consent: Patient had chemotherapy education class and I have discussed extensively the risks and benefits of chemotherapy and she is willing to get started. She signed the consent form today. Our plan is to give her dose dense Adriamycin Cytoxan for 4 cycles followed by Abraxane weekly for 12 weeks. Echocardiogram done on 03/19/2014 showed EF of 55-60% Previous PET/CT scan was Reviewed which did not show any evidence of distant metastatic disease.  Return to clinic in one week for toxicity check and count check.

## 2014-03-31 NOTE — Progress Notes (Signed)
Patient Care Team: Debbrah Alar, NP as PCP - General (Internal Medicine) Excell Seltzer, MD as Consulting Physician (General Surgery) Rulon Eisenmenger, MD as Consulting Physician (Hematology and Oncology) [name], MD as Attending Physician (Radiation Oncology)  DIAGNOSIS: Breast cancer of upper-outer quadrant of left female breast   Staging form: Breast, AJCC 7th Edition     Clinical: Stage IIB (T3, N0, cM0) - Unsigned       Staging comments: Staged at breast conference 12/31/13.      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 with lymphovascular invasion margins negative one out of 4 SLN positive, ER 99%, PR 100%, HER-2 negative ratio 0.74, T2, N1, M0 stage IIB   03/19/2014 PET scan No evidence of distant metastatic disease   03/31/2014 -  Chemotherapy Adjuvant chemotherapy with dose dense Adriamycin Cytoxan x4 followed by Abraxane weekly x12    CHIEF COMPLIANT: Today is cycle 1 day 1 of dose dense Adriamycin Cytoxan  INTERVAL HISTORY: Michelle Kennedy is a 58 year old Caucasian with above-mentioned history of left-sided breast cancer treated with lumpectomy she had one positive lymph node and she is here today for starting cycle 1 of dose dense Adriamycin Cytoxan adjuvant chemotherapy. She was recently in the hospital at [facility] with complaints of chest pressure and shortness of breath. She extensive  workup including a CT of the chest with PE protocol as well as additional cardiac evaluation and no clear-cut etiology was identified. She was given home oxygen was discharged home. She reports that she feels well without oxygen for short periods of time. She wonders how long she needs to wear his oxygen. She has a previously high tendency to have allergy reactions and has a huge list of allergies. She is also very concerned about the risk of tolerance to chemotherapy.  REVIEW OF SYSTEMS:   Constitutional: Denies fevers, chills or abnormal weight loss Eyes: Denies blurriness of vision Ears, nose, mouth, throat, and face: Denies mucositis or sore throat Respiratory: shortness of breath and low oxygen saturations Cardiovascular: Denies palpitation, chest discomfort or lower extremity swelling Gastrointestinal:  Denies nausea, heartburn or change in bowel habits Skin: Denies abnormal skin rashes Lymphatics: Denies new lymphadenopathy or easy bruising Neurological:Denies numbness, tingling or new weaknesses Behavioral/Psych: Mood is stable, no new changes  All other systems were reviewed with the patient and are negative.  I have reviewed the past medical history, past surgical history, social history and family history with the patient and they are unchanged from previous note.  ALLERGIES:  is allergic to bee venom; contrast media; iodine; latex; penicillins; shellfish allergy; aspirin; erythromycin; lidocaine; symbicort; codeine; pentazocine; pentazocine lactate; sulfamethoxazole; and sulfonamide derivatives.  MEDICATIONS:  Current Outpatient Prescriptions  Medication Sig Dispense Refill  . albuterol (PROVENTIL HFA;VENTOLIN HFA) 108 (90 BASE) MCG/ACT inhaler Inhale 2 puffs into the lungs every 6 (six) hours as needed for wheezing or shortness of breath. Please dispense Proair 8.5 g  5  . albuterol (PROVENTIL) (5 MG/ML) 0.5% nebulizer solution Take 2.5 mg by nebulization every 6 (six) hours as  needed for wheezing or shortness of breath.    . benzonatate (TESSALON) 200 MG capsule Take 1 capsule (200 mg total) by mouth 3 (three) times daily as needed for cough. 30 capsule 1  . Calcium Carbonate (CALCIUM 500 PO) Take 1 tablet by mouth 2 (two) times daily.      . cephALEXin (KEFLEX) 500 MG capsule Take 500 mg by mouth 4 (four) times daily.    Marland Kitchen dexamethasone (DECADRON) 4 MG tablet Take 2 tablets by mouth once a day on the day after chemotherapy and then take 2 tablets two times a day for 2 days. Take with food. 30 tablet 1  . diphenhydrAMINE (BENADRYL) 25 MG tablet Take 1 tablet an hour before MRI.  Then take 1 tablet at bedtime as needed, per your usual regimen. 30 tablet 0  . DULoxetine (CYMBALTA) 60 MG capsule Take 1 capsule (60 mg total) by mouth daily. 90 capsule 1  . EPINEPHrine 0.3 mg/0.3 mL IJ SOAJ injection Inject 0.3 mLs (0.3 mg total) into the muscle once. 2 Device 0  . Ergocalciferol (VITAMIN D2) 400 UNITS TABS Take 1 tablet by mouth daily.    . Fluticasone-Salmeterol (ADVAIR DISKUS) 250-50 MCG/DOSE AEPB Inhale 1 puff into the lungs 2 (two) times daily. 180 each 1  . levothyroxine (SYNTHROID, LEVOTHROID) 150 MCG tablet Take 1 tablet (150 mcg total) by mouth daily. 30 tablet 1  . lidocaine-prilocaine (EMLA) cream Apply to affected area once 30 g 3  . LORazepam (ATIVAN) 0.5 MG tablet Take 1 tablet (0.5 mg total) by mouth every 6 (six) hours as needed (Nausea or vomiting). 30 tablet 0  . meloxicam (MOBIC) 7.5 MG tablet TAKE 1 TABLET DAILY 90 tablet 0  . methylPREDNISolone (MEDROL) 4 MG tablet     . Multiple Vitamin (MULTIVITAMIN) tablet Take 1 tablet by mouth daily.      . Omega-3 Fatty Acids (FISH OIL) 1200 MG CAPS Take 1 capsule by mouth daily.      Marland Kitchen omeprazole (PRILOSEC) 40 MG capsule TAKE ONE CAPSULE BY MOUTH EVERY DAY 90 capsule 3  . ondansetron (ZOFRAN) 8 MG tablet Take 1 tablet (8 mg total) by mouth 2 (two) times daily as needed. Start on the third day after chemotherapy. 30  tablet 1  . oxybutynin (DITROPAN-XL) 5 MG 24 hr tablet Take 5 mg by mouth daily.    Marland Kitchen oxyCODONE-acetaminophen (ROXICET) 5-325 MG per tablet 1-2 tablets q 4h prn 20 tablet 0  . prochlorperazine (COMPAZINE) 10 MG tablet Take 1 tablet (10 mg total) by mouth every 6 (six) hours as needed (Nausea or vomiting). 30 tablet 1  . simvastatin (ZOCOR) 40 MG tablet TAKE 1 TABLET (40MG) BY MOUTH DAILY 90 tablet 1  . traMADol (ULTRAM) 50 MG tablet      No current facility-administered medications for this visit.    PHYSICAL EXAMINATION: ECOG PERFORMANCE STATUS: 1 - Symptomatic but completely ambulatory  Filed Vitals:   03/31/14 0844  BP: 142/80  Pulse: 72  Temp: 97.7 F (36.5 C)  Resp: 20   Filed Weights   03/31/14 0844  Weight: 263 lb 12.8 oz (119.659 kg)    GENERAL:alert, no distress and comfortable SKIN: skin color, texture, turgor are normal, no rashes or significant lesions EYES: normal, Conjunctiva are pink and non-injected, sclera clear OROPHARYNX:no exudate, no erythema and lips, buccal mucosa, and tongue normal  NECK: supple,  thyroid normal size, non-tender, without nodularity LYMPH:  no palpable lymphadenopathy in the cervical, axillary or inguinal LUNGS: clear to auscultation and percussion with normal breathing effort HEART: regular rate & rhythm and no murmurs and no lower extremity edema ABDOMEN:abdomen soft, non-tender and normal bowel sounds Musculoskeletal:no cyanosis of digits and no clubbing  NEURO: alert & oriented x 3 with fluent speech, no focal motor/sensory deficits  LABORATORY DATA:  I have reviewed the data as listed   Chemistry      Component Value Date/Time   NA 141 03/31/2014 0826   NA 140 05/31/2011 0905   K 4.1 03/31/2014 0826   K 4.6 05/31/2011 0905   CL 106 05/31/2011 0905   CO2 28 03/31/2014 0826   CO2 24 05/31/2011 0905   BUN 17.1 03/31/2014 0826   BUN 17 05/31/2011 0905   CREATININE 0.9 03/31/2014 0826   CREATININE 0.83 05/31/2011 0905    CREATININE 0.73 12/21/2009 1937      Component Value Date/Time   CALCIUM 9.4 03/31/2014 0826   CALCIUM 9.4 05/31/2011 0905   ALKPHOS 56 03/31/2014 0826   ALKPHOS 52 08/11/2013 0918   AST 22 03/31/2014 0826   AST 19 08/11/2013 0918   ALT 30 03/31/2014 0826   ALT 24 08/11/2013 0918   BILITOT 0.36 03/31/2014 0826   BILITOT 0.4 08/11/2013 0918       Lab Results  Component Value Date   WBC 8.7 03/31/2014   HGB 13.3 03/31/2014   HCT 43.0 03/31/2014   MCV 77.3* 03/31/2014   PLT 278 03/31/2014   NEUTROABS 4.2 03/31/2014     RADIOGRAPHIC STUDIES: I have personally reviewed the radiology reports and agreed with their findings. No results found.   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 M0 stage IIB Chemotherapy consent: Patient had chemotherapy education class and I have discussed extensively the risks and benefits of chemotherapy and she is willing to get started. She signed the consent form today. Our plan is to give her dose dense Adriamycin Cytoxan for 4 cycles followed by Abraxane weekly for 12 weeks. Echocardiogram done on 03/19/2014 showed EF of 55-60% Previous PET/CT scan was Reviewed which did not show any evidence of distant metastatic disease.  Return to clinic in one week for toxicity check and count check.    Asthma Shortness of breath over the weekend: Patient was admitted to [place] clear she had a CT of the chest that revealed no evidence of pulmonary embolism no clear cut etiology identified chest x-rays did not reveal any evidence of cardiopulmonary disease and she was discharged home. She is now on 24-hour oxygen but she plans to slowly wean her off of oxygen. We will have to closely monitor her respiratory status on chemotherapy.   Orders Placed This Encounter   Procedures  . CBC with Differential    Standing Status: Future     Number of Occurrences:      Standing Expiration Date: 03/31/2015  . Comprehensive metabolic panel (Cmet) - [facility]    Standing Status: Future     Number of Occurrences:      Standing Expiration Date: 03/31/2015   The patient has a good understanding of the overall plan. she agrees with it. She will call with any problems that may develop before her next  visit here.  I spent 20 minutes counseling the patient face to face. The total time spent in the appointment was 25 minutes and more than 50% was on counseling and review of test results    Rulon Eisenmenger, MD 03/31/2014 9:42 AM

## 2014-04-01 ENCOUNTER — Encounter: Payer: Self-pay | Admitting: *Deleted

## 2014-04-01 ENCOUNTER — Telehealth: Payer: Self-pay | Admitting: *Deleted

## 2014-04-01 ENCOUNTER — Ambulatory Visit (HOSPITAL_BASED_OUTPATIENT_CLINIC_OR_DEPARTMENT_OTHER): Payer: BC Managed Care – PPO

## 2014-04-01 ENCOUNTER — Telehealth: Payer: Self-pay

## 2014-04-01 DIAGNOSIS — C50412 Malignant neoplasm of upper-outer quadrant of left female breast: Secondary | ICD-10-CM

## 2014-04-01 DIAGNOSIS — Z5189 Encounter for other specified aftercare: Secondary | ICD-10-CM

## 2014-04-01 MED ORDER — PEGFILGRASTIM INJECTION 6 MG/0.6ML ~~LOC~~
6.0000 mg | PREFILLED_SYRINGE | Freq: Once | SUBCUTANEOUS | Status: AC
  Administered 2014-04-01: 6 mg via SUBCUTANEOUS
  Filled 2014-04-01: qty 0.6

## 2014-04-01 NOTE — Telephone Encounter (Signed)
Patient here today for neulasta after having her first chemo treatment yesterday.  Patient denies any nausea, vomiting, or mouth sores.  She states she does have "a slight headache that is getting better today".  She states she is constipated but that is normal for her and she is taking miralax.  Patient is able to eat and drink. Patient is aware she should call for a fever >100.5 or if she has any other questions or concerns.

## 2014-04-01 NOTE — Patient Instructions (Signed)
Pegfilgrastim injection What is this medicine? PEGFILGRASTIM (peg fil GRA stim) is a long-acting granulocyte colony-stimulating factor that stimulates the growth of neutrophils, a type of white blood cell important in the body's fight against infection. It is used to reduce the incidence of fever and infection in patients with certain types of cancer who are receiving chemotherapy that affects the bone marrow. This medicine may be used for other purposes; ask your health care provider or pharmacist if you have questions. COMMON BRAND NAME(S): Neulasta What should I tell my health care provider before I take this medicine? They need to know if you have any of these conditions: -latex allergy -ongoing radiation therapy -sickle cell disease -skin reactions to acrylic adhesives (On-Body Injector only) -an unusual or allergic reaction to pegfilgrastim, filgrastim, other medicines, foods, dyes, or preservatives -pregnant or trying to get pregnant -breast-feeding How should I use this medicine? This medicine is for injection under the skin. If you get this medicine at home, you will be taught how to prepare and give the pre-filled syringe or how to use the On-body Injector. Refer to the patient Instructions for Use for detailed instructions. Use exactly as directed. Take your medicine at regular intervals. Do not take your medicine more often than directed. It is important that you put your used needles and syringes in a special sharps container. Do not put them in a trash can. If you do not have a sharps container, call your pharmacist or healthcare provider to get one. Talk to your pediatrician regarding the use of this medicine in children. Special care may be needed. Overdosage: If you think you have taken too much of this medicine contact a poison control center or emergency room at once. NOTE: This medicine is only for you. Do not share this medicine with others. What if I miss a dose? It is  important not to miss your dose. Call your doctor or health care professional if you miss your dose. If you miss a dose due to an On-body Injector failure or leakage, a new dose should be administered as soon as possible using a single prefilled syringe for manual use. What may interact with this medicine? Interactions have not been studied. Give your health care provider a list of all the medicines, herbs, non-prescription drugs, or dietary supplements you use. Also tell them if you smoke, drink alcohol, or use illegal drugs. Some items may interact with your medicine. This list may not describe all possible interactions. Give your health care provider a list of all the medicines, herbs, non-prescription drugs, or dietary supplements you use. Also tell them if you smoke, drink alcohol, or use illegal drugs. Some items may interact with your medicine. What should I watch for while using this medicine? You may need blood work done while you are taking this medicine. If you are going to need a MRI, CT scan, or other procedure, tell your doctor that you are using this medicine (On-Body Injector only). What side effects may I notice from receiving this medicine? Side effects that you should report to your doctor or health care professional as soon as possible: -allergic reactions like skin rash, itching or hives, swelling of the face, lips, or tongue -dizziness -fever -pain, redness, or irritation at site where injected -pinpoint red spots on the skin -shortness of breath or breathing problems -stomach or side pain, or pain at the shoulder -swelling -tiredness -trouble passing urine Side effects that usually do not require medical attention (report to your doctor   or health care professional if they continue or are bothersome): -bone pain -muscle pain This list may not describe all possible side effects. Call your doctor for medical advice about side effects. You may report side effects to FDA at  1-800-FDA-1088. Where should I keep my medicine? Keep out of the reach of children. Store pre-filled syringes in a refrigerator between 2 and 8 degrees C (36 and 46 degrees F). Do not freeze. Keep in carton to protect from light. Throw away this medicine if it is left out of the refrigerator for more than 48 hours. Throw away any unused medicine after the expiration date. NOTE: This sheet is a summary. It may not cover all possible information. If you have questions about this medicine, talk to your doctor, pharmacist, or health care provider.  2015, [employer] Standard. (2013-08-07 16:14:05)  

## 2014-04-01 NOTE — Telephone Encounter (Signed)
Received notes from [facility] on 11/8. Patient seen in office on 11/10. Sent to scan.

## 2014-04-01 NOTE — Progress Notes (Signed)
Received progress notes from [place] [facility] to scan.

## 2014-04-02 ENCOUNTER — Telehealth: Payer: Self-pay | Admitting: *Deleted

## 2014-04-02 NOTE — Telephone Encounter (Signed)
Took Compazine for the 1st time today about 1 hour ago and she has developed red, flat rash all over her face, chest, arms and back. Her back is itching. Asking what to do. Instructed her per Selena Lesser, NP to take Benadryl 25 mg every 6 hours and pepcid every 12 hours till resolved. Call 911 if she develops any airway difficulty. Call office if rash progresses after Benadryl and Pepcid are taken and we'll get her in to be seen. Instructed her not to take Compazine again due to probable allergic response.

## 2014-04-07 ENCOUNTER — Other Ambulatory Visit (HOSPITAL_BASED_OUTPATIENT_CLINIC_OR_DEPARTMENT_OTHER): Payer: BC Managed Care – PPO

## 2014-04-07 ENCOUNTER — Ambulatory Visit (HOSPITAL_BASED_OUTPATIENT_CLINIC_OR_DEPARTMENT_OTHER): Payer: BC Managed Care – PPO | Admitting: Adult Health

## 2014-04-07 ENCOUNTER — Telehealth: Payer: Self-pay | Admitting: Adult Health

## 2014-04-07 ENCOUNTER — Encounter: Payer: Self-pay | Admitting: Adult Health

## 2014-04-07 VITALS — BP 145/85 | HR 69 | Temp 98.1°F | Resp 17 | Ht 68.0 in | Wt 261.9 lb

## 2014-04-07 DIAGNOSIS — E039 Hypothyroidism, unspecified: Secondary | ICD-10-CM

## 2014-04-07 DIAGNOSIS — C50412 Malignant neoplasm of upper-outer quadrant of left female breast: Secondary | ICD-10-CM

## 2014-04-07 DIAGNOSIS — D702 Other drug-induced agranulocytosis: Secondary | ICD-10-CM

## 2014-04-07 LAB — CBC WITH DIFFERENTIAL/PLATELET
BASO%: 2 % (ref 0.0–2.0)
Basophils Absolute: 0 10*3/uL (ref 0.0–0.1)
EOS%: 10.9 % — AB (ref 0.0–7.0)
Eosinophils Absolute: 0.1 10*3/uL (ref 0.0–0.5)
HCT: 39.1 % (ref 34.8–46.6)
HGB: 12.7 g/dL (ref 11.6–15.9)
LYMPH#: 0.8 10*3/uL — AB (ref 0.9–3.3)
LYMPH%: 83.2 % — ABNORMAL HIGH (ref 14.0–49.7)
MCH: 24.7 pg — AB (ref 25.1–34.0)
MCHC: 32.5 g/dL (ref 31.5–36.0)
MCV: 76.1 fL — ABNORMAL LOW (ref 79.5–101.0)
MONO#: 0 10*3/uL — ABNORMAL LOW (ref 0.1–0.9)
MONO%: 2 % (ref 0.0–14.0)
NEUT#: 0 10*3/uL — CL (ref 1.5–6.5)
NEUT%: 1.9 % — AB (ref 38.4–76.8)
Platelets: 125 10*3/uL — ABNORMAL LOW (ref 145–400)
RBC: 5.14 10*6/uL (ref 3.70–5.45)
RDW: 15 % — AB (ref 11.2–14.5)
WBC: 1 10*3/uL — ABNORMAL LOW (ref 3.9–10.3)
nRBC: 0 % (ref 0–0)

## 2014-04-07 LAB — COMPREHENSIVE METABOLIC PANEL (CC13)
ALT: 55 U/L (ref 0–55)
ANION GAP: 6 meq/L (ref 3–11)
AST: 15 U/L (ref 5–34)
Albumin: 3.3 g/dL — ABNORMAL LOW (ref 3.5–5.0)
Alkaline Phosphatase: 84 U/L (ref 40–150)
BILIRUBIN TOTAL: 0.61 mg/dL (ref 0.20–1.20)
BUN: 8.1 mg/dL (ref 7.0–26.0)
CO2: 29 meq/L (ref 22–29)
CREATININE: 0.8 mg/dL (ref 0.6–1.1)
Calcium: 9.7 mg/dL (ref 8.4–10.4)
Chloride: 103 mEq/L (ref 98–109)
Glucose: 94 mg/dl (ref 70–140)
Potassium: 4.5 mEq/L (ref 3.5–5.1)
Sodium: 138 mEq/L (ref 136–145)
Total Protein: 6.2 g/dL — ABNORMAL LOW (ref 6.4–8.3)

## 2014-04-07 NOTE — Telephone Encounter (Signed)
, °

## 2014-04-07 NOTE — Progress Notes (Signed)
Patient Care Team: Michelle Alar, NP as PCP - General (Internal Medicine) Michelle Seltzer, MD as Consulting Physician (General Surgery) Michelle Eisenmenger, MD as Consulting Physician (Hematology and Oncology) Michelle Gibson, MD as Attending Physician (Radiation Oncology)  DIAGNOSIS: Breast cancer of upper-outer quadrant of left female breast   Staging form: Breast, AJCC 7th Edition     Clinical: Stage IIB (T3, N0, cM0) - Unsigned       Staging comments: Staged at breast conference 12/31/13.      Pathologic: Stage IIB (T2, N1a, cM0) - Signed by Michelle Eisenmenger, MD on 03/10/2014   SUMMARY OF ONCOLOGIC HISTORY:   Breast cancer of upper-outer quadrant of left female breast   12/22/2013 Mammogram Left breast upper-outer quadrant 1.8 x 1.4 x 1.6 cm mass with left axillary lymph node measuring 3.6 mm   12/30/2013 Breast MRI Dominant enhancing left upper outer quadrant irregular mass encompassing a confluent area of abnormal clumped nodular enhancement, 5.5 cm overall, 6 mm masslike enhancement central left breast     02/23/2014 Surgery Left lumpectomy: Invasive ductal carcinoma grade 2 spending 3.8 cm intermediate grade DCIS with lymphovascular invasion margins negative one out of 4 SLN positive, ER 99%, PR 100%, HER-2 negative ratio 0.74, T2, N1, M0 stage IIB   03/19/2014 PET scan No evidence of distant metastatic disease   03/31/2014 -  Chemotherapy Adjuvant chemotherapy with dose dense Adriamycin Cytoxan x4 followed by Abraxane weekly x12    CHIEF COMPLIANT: Today is cycle 1 day 8 of adjuvant dose dense Adriamycin Cytoxan  INTERVAL HISTORY: Michelle Kennedy is a 58 year old Caucasian with above-mentioned history of left-sided breast cancer who is here for follow up after receiving her first cycle of adjuvant chemotherapy.  She is doing moderately well today.  She did experience a headache for a couple of days after chemotherapy.  She was nauseated the first evening, but not severe.  Last night she  developed severe diarrhea and it stopped at 10am this morning.  She has started a [employer] with banana, oatmeal, rice.  She is fatigued.  She denies fevers, chills, vomiting, constipation, skin changes, or any further concerns.    REVIEW OF SYSTEMS:   A 10 point review of systems was conducted and is otherwise negative except for what is noted above.    Past Medical History  Diagnosis Date  . GERD (gastroesophageal reflux disease)   . Hyperlipidemia   . History of seizure age 89    as a reaction to Penicillin - no seizures since  . Migraines   . Arthritis     hips and knees  . History of gastric ulcer     as a teenager  . UTI (lower urinary tract infection) 02/17/2014  . Fibromyalgia   . Asthma     daily inhaler, prn inhaler and neb.  Marland Kitchen History of thyroid cancer     s/p thyroidectomy  . Breast cancer 01/2014    left  . Family history of anesthesia complication     twin brother aspirated and died on OR table, per pt.  . Abnormally small mouth   . Dental bridge present     upper front and lower right  . Dental crowns present     x 3  . Hypothyroidism    Past Surgical History  Procedure Laterality Date  . Cholecystectomy  1990  . Breast surgery  2011    left breast biposy  . Appendectomy  2004  . Tonsillectomy and adenoidectomy  2000  .  Abdominal hysterectomy  2004    complete  . Thyroidectomy  2000  . Colonoscopy w/ polypectomy  06/2009  . Tumor excision      from thoracic spine  . Incontinence surgery  2004  . Achilles tendon repair Right   . Orif toe fracture Right     great toe  . Knee arthroscopy Bilateral     x 6 each knee  . Ligament repair Right     thumb/wrist  . Eye surgery Right 2013    exc. warts from underneath eyelid  . Breast lumpectomy with needle localization and axillary sentinel lymph node bx Left 02/23/2014    Procedure: BREAST LUMPECTOMY WITH NEEDLE LOCALIZATION AND AXILLARY SENTINEL LYMPH NODE BIOPSY;  Surgeon: Michelle Seltzer, MD;  Location:  [place];  Service: General;  Laterality: Left;  . Portacath placement N/A 03/24/2014    Procedure: INSERTION PORT-A-CATH;  Surgeon: Michelle Seltzer, MD;  Location: [place];  Service: General;  Laterality: N/A;   History   Social History  . Marital Status: Married    Spouse Name: N/A    Number of Children: N/A  . 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 Smoking status: Never Smoker   . Smokeless tobacco: Never Used  . Alcohol Use: No  . Drug Use: No  . Sexual Activity: None   Other Topics Concern  . None   Social History Narrative   Regular exercise: yes         Family History  Problem Relation Age of Onset  . Alcohol abuse Mother   . Arthritis Mother   . Hypertension Mother   . Bipolar disorder Mother   . Breast cancer Mother 91    unconfirmed  . Lung cancer Mother 48    smoker  . Alcohol abuse Father   . Cancer Father     lung  . Hyperlipidemia Father   . Kidney disease Father   . Diabetes Father   . Arthritis Maternal Grandmother   . Diabetes Paternal Grandmother   . Thyroid cancer Sister 43    type?; currently 83  . Other Sister     ovarian tumor @ 38; TAH/BSO  . Thyroid cancer Brother     dx 75s; currently 47  . Breast cancer Maternal Aunt     dx 63s; deceased 37  . Thyroid cancer Paternal Aunt     All 3 paternal aunts with thyroid ca in 30s/40s  . Lung cancer Paternal Aunt     2 of 3 paternal aunts with lung cancer    ALLERGIES:  is allergic to bee venom; contrast media; iodine; latex; penicillins; shellfish allergy; aspirin; erythromycin; lidocaine; symbicort; codeine; compazine; pentazocine; pentazocine lactate; sulfamethoxazole; and sulfonamide derivatives.  MEDICATIONS:  Current Outpatient Prescriptions  Medication Sig Dispense Refill  . Calcium Carbonate (CALCIUM 500 PO) Take 1 tablet by mouth 2 (two) times daily.      Marland Kitchen dexamethasone (DECADRON) 4 MG  tablet Take 2 tablets by mouth once a day on the day after chemotherapy and then take 2 tablets two times a day for 2 days. Take with food. 30 tablet 1  . DULoxetine (CYMBALTA) 60 MG capsule Take 1 capsule (60 mg total) by mouth daily. 90 capsule 1  . Ergocalciferol (VITAMIN D2) 400 UNITS TABS Take 1 tablet by mouth daily.    . Fluticasone-Salmeterol (ADVAIR DISKUS) 250-50 MCG/DOSE AEPB Inhale 1 puff into the  lungs 2 (two) times daily. 180 each 1  . levothyroxine (SYNTHROID, LEVOTHROID) 150 MCG tablet Take 1 tablet (150 mcg total) by mouth daily. 30 tablet 1  . LORazepam (ATIVAN) 0.5 MG tablet Take 1 tablet (0.5 mg total) by mouth every 6 (six) hours as needed (Nausea or vomiting). 30 tablet 0  . meloxicam (MOBIC) 7.5 MG tablet TAKE 1 TABLET DAILY 90 tablet 0  . Multiple Vitamin (MULTIVITAMIN) tablet Take 1 tablet by mouth daily.      . Omega-3 Fatty Acids (FISH OIL) 1200 MG CAPS Take 1 capsule by mouth daily.      Marland Kitchen omeprazole (PRILOSEC) 40 MG capsule TAKE ONE CAPSULE BY MOUTH EVERY DAY 90 capsule 3  . ondansetron (ZOFRAN) 8 MG tablet Take 1 tablet (8 mg total) by mouth 2 (two) times daily as needed. Start on the third day after chemotherapy. 30 tablet 1  . oxybutynin (DITROPAN-XL) 5 MG 24 hr tablet Take 5 mg by mouth daily.    . simvastatin (ZOCOR) 40 MG tablet TAKE 1 TABLET (40MG) BY MOUTH DAILY 90 tablet 1  . albuterol (PROVENTIL HFA;VENTOLIN HFA) 108 (90 BASE) MCG/ACT inhaler Inhale 2 puffs into the lungs every 6 (six) hours as needed for wheezing or shortness of breath. Please dispense Proair 8.5 g 5  . albuterol (PROVENTIL) (5 MG/ML) 0.5% nebulizer solution Take 2.5 mg by nebulization every 6 (six) hours as needed for wheezing or shortness of breath.    . benzonatate (TESSALON) 200 MG capsule Take 1 capsule (200 mg total) by mouth 3 (three) times daily as needed for cough. 30 capsule 1  . cephALEXin (KEFLEX) 500 MG capsule Take 500 mg by mouth 4 (four) times daily.    . diphenhydrAMINE  (BENADRYL) 25 MG tablet Take 1 tablet an hour before MRI.  Then take 1 tablet at bedtime as needed, per your usual regimen. 30 tablet 0  . EPINEPHrine 0.3 mg/0.3 mL IJ SOAJ injection Inject 0.3 mLs (0.3 mg total) into the muscle once. 2 Device 0  . methylPREDNISolone (MEDROL) 4 MG tablet     . oxyCODONE-acetaminophen (ROXICET) 5-325 MG per tablet 1-2 tablets q 4h prn 20 tablet 0  . traMADol (ULTRAM) 50 MG tablet      No current facility-administered medications for this visit.    PHYSICAL EXAMINATION: ECOG PERFORMANCE STATUS: 1 - Symptomatic but completely ambulatory  Filed Vitals:   04/07/14 1528  BP: 145/85  Pulse: 69  Temp: 98.1 F (36.7 C)  Resp: 17   Filed Weights   04/07/14 1528  Weight: 261 lb 14.4 oz (118.797 kg)  GENERAL: Patient is a well appearing female in no acute distress HEENT:  Sclerae anicteric.  Oropharynx clear and moist. No ulcerations or evidence of oropharyngeal candidiasis. Neck is supple.  NODES:  No cervical, supraclavicular, or axillary lymphadenopathy palpated.  BREAST EXAM:  Deferred. LUNGS:  Clear to auscultation bilaterally.  No wheezes or rhonchi. HEART:  Regular rate and rhythm. No murmur appreciated. ABDOMEN:  Soft, nontender.  Positive, normoactive bowel sounds. No organomegaly palpated. MSK:  No focal spinal tenderness to palpation. Full range of motion bilaterally in the upper extremities. EXTREMITIES:  No peripheral edema.   SKIN:  Clear with no obvious rashes or skin changes. No nail dyscrasia. NEURO:  Nonfocal. Well oriented.  Appropriate affect.   LABORATORY DATA:  I have reviewed the data as listed   Chemistry      Component Value Date/Time   NA 141 03/31/2014 0826   NA 140  05/31/2011 0905   K 4.1 03/31/2014 0826   K 4.6 05/31/2011 0905   CL 106 05/31/2011 0905   CO2 28 03/31/2014 0826   CO2 24 05/31/2011 0905   BUN 17.1 03/31/2014 0826   BUN 17 05/31/2011 0905   CREATININE 0.9 03/31/2014 0826   CREATININE 0.83 05/31/2011  0905   CREATININE 0.73 12/21/2009 1937      Component Value Date/Time   CALCIUM 9.4 03/31/2014 0826   CALCIUM 9.4 05/31/2011 0905   ALKPHOS 56 03/31/2014 0826   ALKPHOS 52 08/11/2013 0918   AST 22 03/31/2014 0826   AST 19 08/11/2013 0918   ALT 30 03/31/2014 0826   ALT 24 08/11/2013 0918   BILITOT 0.36 03/31/2014 0826   BILITOT 0.4 08/11/2013 0918       Lab Results  Component Value Date   WBC 1.0* 04/07/2014   HGB 12.7 04/07/2014   HCT 39.1 04/07/2014   MCV 76.1* 04/07/2014   PLT 125* 04/07/2014   NEUTROABS 0.0* 04/07/2014     RADIOGRAPHIC STUDIES: I have personally reviewed the radiology reports and agreed with their findings. No results found.   ASSESSMENT:  58 year old [place] woman with T2, N1 stage IIB invasive ductal carcinoma, grade II, ER 99%, PR 100%, HER-2/neu neg.   1. Patient began adjuvant chemotherapy with Adriamcyin and Cytoxan on 03/31/2014.  A total of 4 cycles are planned.   PLAN:  [place] is doing moderately well following her first cycle of adjuvant chemotherapy.  She appears to have tolerated chemotherapy moderately well.   Her diarrhea is improved with the BRAT diet.  I did tell her that she could take Imodium if necessary.    [place] is neutropenic following chemotherapy.  Her ANC is 0.0.  I gave her detailed neutropenic instructions and she will call us for any fevers, chills, or rigors.    The patient has a good understanding of the overall plan. she agrees with it. She will call with any problems that may develop before her next visit here.  I spent 25 minutes counseling the patient face to face. The total time spent in the appointment was 30 minutes and more than 50% was on counseling and review of test results   Minette Headland, [place] [phone] 04/07/2014 3:47 PM

## 2014-04-07 NOTE — Patient Instructions (Signed)
  Patient Neutropenia Instruction Sheet  Diagnosis: Breast Cancer      Treating Physician: Dr. Lindi Adie  Treatment: 1. Type of chemotherapy: Doxorubicin/Cyclophosphamide 2. Date of last treatment: 03/31/14  Last Blood Counts: Lab Results  Component Value Date   WBC 1.0* 04/07/2014   HGB 12.7 04/07/2014   HCT 39.1 04/07/2014   MCV 76.1* 04/07/2014   PLT 125* 04/07/2014  ANC 0.0     Instructions: 1. Monitor temperature and call if fever  greater than 100.5, chills, shaking chills (rigors) 2. Call Physician on-call at [phone] 3. Give him/her symptoms and list of medications that you are taking and your last blood count.

## 2014-04-08 ENCOUNTER — Telehealth: Payer: Self-pay | Admitting: *Deleted

## 2014-04-08 NOTE — Telephone Encounter (Signed)
Per staff message and POF I have scheduled appts. Advised scheduler of appts. JMW  

## 2014-04-09 ENCOUNTER — Telehealth: Payer: Self-pay | Admitting: Hematology and Oncology

## 2014-04-09 ENCOUNTER — Encounter: Payer: BC Managed Care – PPO | Admitting: Nurse Practitioner

## 2014-04-09 ENCOUNTER — Other Ambulatory Visit: Payer: BC Managed Care – PPO

## 2014-04-09 DIAGNOSIS — C50412 Malignant neoplasm of upper-outer quadrant of left female breast: Secondary | ICD-10-CM

## 2014-04-09 NOTE — Telephone Encounter (Signed)
lvm for pt regarding to added appt for today...Michelle KitchenMarland Kennedy

## 2014-04-09 NOTE — Progress Notes (Signed)
Triage Call Report rcvd from Call A Nurse dtd 04/08/14 6:23 pm.  Uncontrolled diarrhea.  [facility] RN contacted for appt today.  POF sent and lab orders entered.

## 2014-04-09 NOTE — Telephone Encounter (Signed)
pt called back and expressed that she just woke up and her diarreah has stoped and does not want to come today

## 2014-04-09 NOTE — Progress Notes (Signed)
Call report sent to scan.

## 2014-04-14 ENCOUNTER — Ambulatory Visit (HOSPITAL_BASED_OUTPATIENT_CLINIC_OR_DEPARTMENT_OTHER): Payer: BC Managed Care – PPO | Admitting: Hematology and Oncology

## 2014-04-14 ENCOUNTER — Other Ambulatory Visit (HOSPITAL_BASED_OUTPATIENT_CLINIC_OR_DEPARTMENT_OTHER): Payer: BC Managed Care – PPO

## 2014-04-14 ENCOUNTER — Ambulatory Visit (HOSPITAL_BASED_OUTPATIENT_CLINIC_OR_DEPARTMENT_OTHER): Payer: BC Managed Care – PPO

## 2014-04-14 ENCOUNTER — Other Ambulatory Visit: Payer: Self-pay | Admitting: Hematology and Oncology

## 2014-04-14 VITALS — BP 150/89 | HR 80 | Temp 98.5°F | Resp 18 | Ht 68.0 in | Wt 266.7 lb

## 2014-04-14 DIAGNOSIS — C50919 Malignant neoplasm of unspecified site of unspecified female breast: Secondary | ICD-10-CM

## 2014-04-14 DIAGNOSIS — C50412 Malignant neoplasm of upper-outer quadrant of left female breast: Secondary | ICD-10-CM

## 2014-04-14 DIAGNOSIS — R5383 Other fatigue: Secondary | ICD-10-CM

## 2014-04-14 DIAGNOSIS — L659 Nonscarring hair loss, unspecified: Secondary | ICD-10-CM

## 2014-04-14 DIAGNOSIS — Z5111 Encounter for antineoplastic chemotherapy: Secondary | ICD-10-CM

## 2014-04-14 LAB — CBC WITH DIFFERENTIAL/PLATELET
BASO%: 0.6 % (ref 0.0–2.0)
BASOS ABS: 0 10*3/uL (ref 0.0–0.1)
EOS%: 0.7 % (ref 0.0–7.0)
Eosinophils Absolute: 0 10*3/uL (ref 0.0–0.5)
HCT: 40 % (ref 34.8–46.6)
HEMOGLOBIN: 12.2 g/dL (ref 11.6–15.9)
LYMPH#: 1.5 10*3/uL (ref 0.9–3.3)
LYMPH%: 25.4 % (ref 14.0–49.7)
MCH: 23.7 pg — AB (ref 25.1–34.0)
MCHC: 30.5 g/dL — ABNORMAL LOW (ref 31.5–36.0)
MCV: 77.6 fL — ABNORMAL LOW (ref 79.5–101.0)
MONO#: 0.5 10*3/uL (ref 0.1–0.9)
MONO%: 8.6 % (ref 0.0–14.0)
NEUT%: 64.7 % (ref 38.4–76.8)
NEUTROS ABS: 3.9 10*3/uL (ref 1.5–6.5)
Platelets: 280 10*3/uL (ref 145–400)
RBC: 5.15 10*6/uL (ref 3.70–5.45)
RDW: 15.3 % — ABNORMAL HIGH (ref 11.2–14.5)
WBC: 6 10*3/uL (ref 3.9–10.3)

## 2014-04-14 LAB — COMPREHENSIVE METABOLIC PANEL (CC13)
ALBUMIN: 3.3 g/dL — AB (ref 3.5–5.0)
ALT: 37 U/L (ref 0–55)
ANION GAP: 9 meq/L (ref 3–11)
AST: 18 U/L (ref 5–34)
Alkaline Phosphatase: 79 U/L (ref 40–150)
BUN: 9.6 mg/dL (ref 7.0–26.0)
CALCIUM: 9.2 mg/dL (ref 8.4–10.4)
CHLORIDE: 106 meq/L (ref 98–109)
CO2: 26 meq/L (ref 22–29)
CREATININE: 0.8 mg/dL (ref 0.6–1.1)
Glucose: 106 mg/dl (ref 70–140)
Potassium: 3.8 mEq/L (ref 3.5–5.1)
Sodium: 141 mEq/L (ref 136–145)
Total Bilirubin: <0.2 mg/dL (ref 0.20–1.20)
Total Protein: 6.3 g/dL — ABNORMAL LOW (ref 6.4–8.3)

## 2014-04-14 MED ORDER — SODIUM CHLORIDE 0.9 % IJ SOLN
10.0000 mL | INTRAMUSCULAR | Status: DC | PRN
  Administered 2014-04-14: 10 mL
  Filled 2014-04-14: qty 10

## 2014-04-14 MED ORDER — SODIUM CHLORIDE 0.9 % IV SOLN
Freq: Once | INTRAVENOUS | Status: AC
  Administered 2014-04-14: 12:00:00 via INTRAVENOUS

## 2014-04-14 MED ORDER — DEXAMETHASONE SODIUM PHOSPHATE 10 MG/ML IJ SOLN
INTRAMUSCULAR | Status: AC
  Filled 2014-04-14: qty 1

## 2014-04-14 MED ORDER — NITROGLYCERIN 0.4 MG SL SUBL
0.4000 mg | SUBLINGUAL_TABLET | SUBLINGUAL | Status: DC | PRN
Start: 2014-04-14 — End: 2018-07-05

## 2014-04-14 MED ORDER — PALONOSETRON HCL INJECTION 0.25 MG/5ML
INTRAVENOUS | Status: AC
Start: 2014-04-14 — End: 2014-04-14
  Filled 2014-04-14: qty 5

## 2014-04-14 MED ORDER — DEXAMETHASONE SODIUM PHOSPHATE 10 MG/ML IJ SOLN
10.0000 mg | Freq: Once | INTRAMUSCULAR | Status: AC
  Administered 2014-04-14: 10 mg via INTRAVENOUS

## 2014-04-14 MED ORDER — PALONOSETRON HCL INJECTION 0.25 MG/5ML
0.2500 mg | Freq: Once | INTRAVENOUS | Status: AC
  Administered 2014-04-14: 0.25 mg via INTRAVENOUS

## 2014-04-14 MED ORDER — CYCLOPHOSPHAMIDE CHEMO INJECTION 1 GM
600.0000 mg/m2 | Freq: Once | INTRAMUSCULAR | Status: AC
  Administered 2014-04-14: 1440 mg via INTRAVENOUS
  Filled 2014-04-14: qty 72

## 2014-04-14 MED ORDER — HEPARIN SOD (PORK) LOCK FLUSH 100 UNIT/ML IV SOLN
500.0000 [IU] | Freq: Once | INTRAVENOUS | Status: AC | PRN
  Administered 2014-04-14: 500 [IU]
  Filled 2014-04-14: qty 5

## 2014-04-14 MED ORDER — SODIUM CHLORIDE 0.9 % IV SOLN
150.0000 mg | Freq: Once | INTRAVENOUS | Status: AC
  Administered 2014-04-14: 150 mg via INTRAVENOUS
  Filled 2014-04-14: qty 5

## 2014-04-14 MED ORDER — DOXORUBICIN HCL CHEMO IV INJECTION 2 MG/ML
60.0000 mg/m2 | Freq: Once | INTRAVENOUS | Status: AC
  Administered 2014-04-14: 144 mg via INTRAVENOUS
  Filled 2014-04-14: qty 72

## 2014-04-14 NOTE — Progress Notes (Signed)
Patient Care Team: Debbrah Alar, NP as PCP - General (Internal Medicine) Excell Seltzer, MD as Consulting Physician (General Surgery) Rulon Eisenmenger, MD as Consulting Physician (Hematology and Oncology) [name], MD as Attending Physician (Radiation Oncology)  DIAGNOSIS: Breast cancer of upper-outer quadrant of left female breast   Staging form: Breast, AJCC 7th Edition     Clinical: Stage IIB (T3, N0, cM0) - Unsigned       Staging comments: Staged at breast conference 12/31/13.      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 with lymphovascular invasion margins negative one out of 4 SLN positive, ER 99%, PR 100%, HER-2 negative ratio 0.74, T2, N1, M0 stage IIB   03/19/2014 PET scan No evidence of distant metastatic disease   03/31/2014 -  Chemotherapy Adjuvant chemotherapy with dose dense Adriamycin Cytoxan x4 followed by Abraxane weekly x12    CHIEF COMPLIANT: cycle 2 day one dose dense Adriamycin Cytoxan  INTERVAL HISTORY: Michelle Kennedy is a 58 year old Caucasian with above-mentioned history of left breast cancer treated with lumpectomy and is currently on adjuvant chemotherapy. After cycle 1 of dose dense Adriamycin and Cytoxan, patient had diarrhea that lasted 2 and half days. It finally resolved with Imodium. But she drank a whole bottle in those 3 days. She does not have any further problems  with diarrhea. The week after chemotherapy on followup, patient's blood work showed an [place] of 0. She denied any fevers or chills. Prior to this she had an episode of chest pain that led to an emergency room visit to [place]. They ruled out coronary artery disease and the diagnosis was esophageal spasm.  REVIEW OF SYSTEMS:   Constitutional: Denies fevers, chills or abnormal weight loss Eyes: Denies blurriness of vision Ears, nose, mouth, throat, and face: Denies mucositis or sore throat Respiratory: Denies cough, dyspnea or wheezes Cardiovascular: Denies palpitation, chest discomfort or lower extremity swelling Gastrointestinal:  Denies nausea, heartburn or change in bowel habits Skin: Denies abnormal skin rashes Lymphatics: Denies new lymphadenopathy or easy bruising Neurological:Denies numbness, tingling or new weaknesses Behavioral/Psych: Mood is stable, no new changes  All other systems were reviewed with the patient and are negative.  I have reviewed the past medical history, past surgical history, social history and family history with the patient and they are unchanged from previous note.  ALLERGIES:  is allergic to bee venom; contrast media; iodine; latex; penicillins; shellfish allergy; aspirin; erythromycin; lidocaine; symbicort; codeine; compazine; pentazocine; pentazocine lactate; sulfamethoxazole; and sulfonamide derivatives.  MEDICATIONS:  Current Outpatient Prescriptions  Medication Sig Dispense Refill  . albuterol (PROVENTIL HFA;VENTOLIN HFA) 108 (90 BASE) MCG/ACT inhaler Inhale 2 puffs into the lungs every 6 (six) hours as needed for wheezing or shortness of breath. Please dispense Proair 8.5 g 5  . albuterol (PROVENTIL) (5 MG/ML) 0.5% nebulizer solution Take 2.5 mg by nebulization every 6 (six) hours as needed for wheezing or shortness of breath.    Marland Kitchen  benzonatate (TESSALON) 200 MG capsule Take 1 capsule (200 mg total) by mouth 3 (three) times daily as needed for cough. 30  capsule 1  . Calcium Carbonate (CALCIUM 500 PO) Take 1 tablet by mouth 2 (two) times daily.      . cephALEXin (KEFLEX) 500 MG capsule Take 500 mg by mouth 4 (four) times daily.    Marland Kitchen dexamethasone (DECADRON) 4 MG tablet Take 2 tablets by mouth once a day on the day after chemotherapy and then take 2 tablets two times a day for 2 days. Take with food. 30 tablet 1  . diphenhydrAMINE (BENADRYL) 25 MG tablet Take 1 tablet an hour before MRI.  Then take 1 tablet at bedtime as needed, per your usual regimen. 30 tablet 0  . DULoxetine (CYMBALTA) 60 MG capsule Take 1 capsule (60 mg total) by mouth daily. 90 capsule 1  . EPINEPHrine 0.3 mg/0.3 mL IJ SOAJ injection Inject 0.3 mLs (0.3 mg total) into the muscle once. 2 Device 0  . Ergocalciferol (VITAMIN D2) 400 UNITS TABS Take 1 tablet by mouth daily.    . Fluticasone-Salmeterol (ADVAIR DISKUS) 250-50 MCG/DOSE AEPB Inhale 1 puff into the lungs 2 (two) times daily. 180 each 1  . levothyroxine (SYNTHROID, LEVOTHROID) 150 MCG tablet Take 1 tablet (150 mcg total) by mouth daily. 30 tablet 1  . LORazepam (ATIVAN) 0.5 MG tablet Take 1 tablet (0.5 mg total) by mouth every 6 (six) hours as needed (Nausea or vomiting). 30 tablet 0  . meloxicam (MOBIC) 7.5 MG tablet TAKE 1 TABLET DAILY 90 tablet 0  . methylPREDNISolone (MEDROL) 4 MG tablet     . Multiple Vitamin (MULTIVITAMIN) tablet Take 1 tablet by mouth daily.      . Omega-3 Fatty Acids (FISH OIL) 1200 MG CAPS Take 1 capsule by mouth daily.      Marland Kitchen omeprazole (PRILOSEC) 40 MG capsule TAKE ONE CAPSULE BY MOUTH EVERY DAY 90 capsule 3  . ondansetron (ZOFRAN) 8 MG tablet Take 1 tablet (8 mg total) by mouth 2 (two) times daily as needed. Start on the third day after chemotherapy. 30 tablet 1  . oxybutynin (DITROPAN-XL) 5 MG 24 hr tablet Take 5 mg by mouth daily.    Marland Kitchen oxyCODONE-acetaminophen (ROXICET) 5-325 MG per tablet 1-2 tablets q 4h prn 20 tablet 0  . prochlorperazine (COMPAZINE) 10 MG tablet   1  . simvastatin  (ZOCOR) 40 MG tablet TAKE 1 TABLET (40MG) BY MOUTH DAILY 90 tablet 1  . traMADol (ULTRAM) 50 MG tablet     . nitroGLYCERIN (NITROSTAT) 0.4 MG SL tablet Place 1 tablet (0.4 mg total) under the tongue every 5 (five) minutes as needed for chest pain. 30 tablet 0   No current facility-administered medications for this visit.    PHYSICAL EXAMINATION: ECOG PERFORMANCE STATUS: 1 - Symptomatic but completely ambulatory  Filed Vitals:   04/14/14 1051  BP: 150/89  Pulse: 80  Temp: 98.5 F (36.9 C)  Resp: 18   Filed Weights   04/14/14 1051  Weight: 266 lb 11.2 oz (120.974 kg)    GENERAL:alert, no distress and comfortable SKIN: skin color, texture, turgor are normal, no rashes or significant lesions EYES: normal, Conjunctiva are pink and non-injected, sclera clear OROPHARYNX:no exudate, no erythema and lips, buccal mucosa, and tongue normal  NECK: supple, thyroid normal size, non-tender, without nodularity LYMPH:  no palpable lymphadenopathy in the cervical, axillary or inguinal LUNGS: clear to auscultation and percussion with normal breathing effort HEART: regular rate & rhythm  and no murmurs and no lower extremity edema ABDOMEN:abdomen soft, non-tender and normal bowel sounds Musculoskeletal:no cyanosis of digits and no clubbing  NEURO: alert & oriented x 3 with fluent speech, no focal motor/sensory deficits  LABORATORY DATA:  I have reviewed the data as listed   Chemistry      Component Value Date/Time   NA 141 04/14/2014 1035   NA 140 05/31/2011 0905   K 3.8 04/14/2014 1035   K 4.6 05/31/2011 0905   CL 106 05/31/2011 0905   CO2 26 04/14/2014 1035   CO2 24 05/31/2011 0905   BUN 9.6 04/14/2014 1035   BUN 17 05/31/2011 0905   CREATININE 0.8 04/14/2014 1035   CREATININE 0.83 05/31/2011 0905   CREATININE 0.73 12/21/2009 1937      Component Value Date/Time   CALCIUM 9.2 04/14/2014 1035   CALCIUM 9.4 05/31/2011 0905   ALKPHOS 79 04/14/2014 1035   ALKPHOS 52 08/11/2013 0918    AST 18 04/14/2014 1035   AST 19 08/11/2013 0918   ALT 37 04/14/2014 1035   ALT 24 08/11/2013 0918   BILITOT <0.20 04/14/2014 1035   BILITOT 0.4 08/11/2013 0918       Lab Results  Component Value Date   WBC 6.0 04/14/2014   HGB 12.2 04/14/2014   HCT 40.0 04/14/2014   MCV 77.6* 04/14/2014   PLT 280 04/14/2014   NEUTROABS 3.9 04/14/2014   ASSESSMENT & PLAN:  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 M0 stage IIB  Today is cycle 2 day 1 of chemotherapy with dose dense Adriamycin Cytoxan. Patient experienced falling toxicities of chemotherapy 1. Diarrhea related to chemotherapy: lasted 2 and half days. patient used Imodium to help suppress the diarrhea. I instructed her to take yogurt and also to use Imodium as needed. If diarrhea continues to be a problem we can use Lomotil. 2. Chemotherapy-induced severe neutropenia: her counts have fully recovered today 3. Alopecia 4. Esophageal spasms led to emergency room visit to [place] where they have ruled out coronary artery disease 5. Fatigue  Return to clinic in one week for toxicity check. I reduced the dosage of IV dexamethasone from 12 mg to 10 mg. I also prescribed her nitroglycerin as this seems to have helped with esophageal spasms.   Orders Placed This Encounter  Procedures  . CBC with Differential    Standing Status: Future     Number of Occurrences:      Standing Expiration Date: 04/14/2015  . Comprehensive metabolic panel (Cmet) - [facility]    Standing Status: Future     Number of Occurrences:      Standing Expiration Date: 04/14/2015   The patient has a good understanding of the overall plan. she agrees with it. She will call with any problems that may develop before her next visit here.    Rulon Eisenmenger, MD 04/14/2014 11:21 AM

## 2014-04-14 NOTE — Assessment & Plan Note (Signed)
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 M0 stage IIB  Today is cycle 2 day 1 of chemotherapy with dose dense Adriamycin Cytoxan. Patient experienced falling toxicities of chemotherapy 1. Diarrhea related to chemotherapy: lasted 2 and half days. patient used Imodium to help suppress the diarrhea. I instructed her to take yogurt and also to use Imodium as needed. If diarrhea continues to be a problem we can use Lomotil. 2. Chemotherapy-induced severe neutropenia: her counts have fully recovered today 3. Alopecia 4. Esophageal spasms led to emergency room visit to [place] where they have ruled out coronary artery disease 5. Fatigue  Return to clinic in one week for toxicity check. I reduced the dosage of IV dexamethasone from 12 mg to 10 mg. I also prescribed her nitroglycerin as this seems to have helped with esophageal spasms.

## 2014-04-14 NOTE — Patient Instructions (Signed)
[facility] Discharge Instructions for Patients Receiving Chemotherapy  Today you received the following chemotherapy agents: Adriamycin, Cytoxan  To help prevent nausea and vomiting after your treatment, we encourage you to take your nausea medication: Zofran, Compazine, dexamethazone. As directed.   If you develop nausea and vomiting that is not controlled by your nausea medication, call the clinic.   BELOW ARE SYMPTOMS THAT SHOULD BE REPORTED IMMEDIATELY:  *FEVER GREATER THAN 100.5 F  *CHILLS WITH OR WITHOUT FEVER  NAUSEA AND VOMITING THAT IS NOT CONTROLLED WITH YOUR NAUSEA MEDICATION  *UNUSUAL SHORTNESS OF BREATH  *UNUSUAL BRUISING OR BLEEDING  TENDERNESS IN MOUTH AND THROAT WITH OR WITHOUT PRESENCE OF ULCERS  *URINARY PROBLEMS  *BOWEL PROBLEMS  UNUSUAL RASH Items with * indicate a potential emergency and should be followed up as soon as possible.  Feel free to call the clinic you have any questions or concerns. The clinic phone number is [phone].

## 2014-04-15 ENCOUNTER — Ambulatory Visit (HOSPITAL_BASED_OUTPATIENT_CLINIC_OR_DEPARTMENT_OTHER): Payer: BC Managed Care – PPO

## 2014-04-15 ENCOUNTER — Telehealth: Payer: Self-pay | Admitting: Hematology and Oncology

## 2014-04-15 DIAGNOSIS — C50412 Malignant neoplasm of upper-outer quadrant of left female breast: Secondary | ICD-10-CM

## 2014-04-15 DIAGNOSIS — Z5189 Encounter for other specified aftercare: Secondary | ICD-10-CM

## 2014-04-15 MED ORDER — PEGFILGRASTIM INJECTION 6 MG/0.6ML ~~LOC~~
6.0000 mg | PREFILLED_SYRINGE | Freq: Once | SUBCUTANEOUS | Status: AC
  Administered 2014-04-15: 6 mg via SUBCUTANEOUS
  Filled 2014-04-15: qty 0.6

## 2014-04-15 NOTE — Telephone Encounter (Signed)
, °

## 2014-04-21 ENCOUNTER — Ambulatory Visit (HOSPITAL_BASED_OUTPATIENT_CLINIC_OR_DEPARTMENT_OTHER): Payer: BC Managed Care – PPO | Admitting: Hematology and Oncology

## 2014-04-21 ENCOUNTER — Other Ambulatory Visit (HOSPITAL_BASED_OUTPATIENT_CLINIC_OR_DEPARTMENT_OTHER): Payer: BC Managed Care – PPO

## 2014-04-21 DIAGNOSIS — L659 Nonscarring hair loss, unspecified: Secondary | ICD-10-CM

## 2014-04-21 DIAGNOSIS — C50412 Malignant neoplasm of upper-outer quadrant of left female breast: Secondary | ICD-10-CM

## 2014-04-21 DIAGNOSIS — C50919 Malignant neoplasm of unspecified site of unspecified female breast: Secondary | ICD-10-CM

## 2014-04-21 DIAGNOSIS — K224 Dyskinesia of esophagus: Secondary | ICD-10-CM

## 2014-04-21 DIAGNOSIS — R5383 Other fatigue: Secondary | ICD-10-CM

## 2014-04-21 DIAGNOSIS — D702 Other drug-induced agranulocytosis: Secondary | ICD-10-CM

## 2014-04-21 DIAGNOSIS — R197 Diarrhea, unspecified: Secondary | ICD-10-CM

## 2014-04-21 LAB — CBC WITH DIFFERENTIAL/PLATELET
BASO%: 3.4 % — AB (ref 0.0–2.0)
Basophils Absolute: 0 10*3/uL (ref 0.0–0.1)
EOS%: 2.2 % (ref 0.0–7.0)
Eosinophils Absolute: 0 10*3/uL (ref 0.0–0.5)
HCT: 37.7 % (ref 34.8–46.6)
HGB: 12.3 g/dL (ref 11.6–15.9)
LYMPH%: 79.8 % — AB (ref 14.0–49.7)
MCH: 24.6 pg — ABNORMAL LOW (ref 25.1–34.0)
MCHC: 32.6 g/dL (ref 31.5–36.0)
MCV: 75.4 fL — ABNORMAL LOW (ref 79.5–101.0)
MONO#: 0.1 10*3/uL (ref 0.1–0.9)
MONO%: 7.9 % (ref 0.0–14.0)
NEUT%: 6.7 % — ABNORMAL LOW (ref 38.4–76.8)
NEUTROS ABS: 0.1 10*3/uL — AB (ref 1.5–6.5)
NRBC: 0 % (ref 0–0)
PLATELETS: 216 10*3/uL (ref 145–400)
RBC: 5 10*6/uL (ref 3.70–5.45)
RDW: 15.8 % — AB (ref 11.2–14.5)
WBC: 0.9 10*3/uL — CL (ref 3.9–10.3)
lymph#: 0.7 10*3/uL — ABNORMAL LOW (ref 0.9–3.3)

## 2014-04-21 LAB — COMPREHENSIVE METABOLIC PANEL (CC13)
ALT: 41 U/L (ref 0–55)
AST: 12 U/L (ref 5–34)
Albumin: 3.5 g/dL (ref 3.5–5.0)
Alkaline Phosphatase: 73 U/L (ref 40–150)
Anion Gap: 9 mEq/L (ref 3–11)
BILIRUBIN TOTAL: 0.46 mg/dL (ref 0.20–1.20)
BUN: 9.2 mg/dL (ref 7.0–26.0)
CO2: 28 mEq/L (ref 22–29)
Calcium: 9.5 mg/dL (ref 8.4–10.4)
Chloride: 103 mEq/L (ref 98–109)
Creatinine: 0.8 mg/dL (ref 0.6–1.1)
Glucose: 105 mg/dl (ref 70–140)
Potassium: 4.2 mEq/L (ref 3.5–5.1)
SODIUM: 139 meq/L (ref 136–145)
TOTAL PROTEIN: 6.3 g/dL — AB (ref 6.4–8.3)

## 2014-04-21 NOTE — Progress Notes (Signed)
Patient Care Team: Debbrah Alar, NP as PCP - General (Internal Medicine) Excell Seltzer, MD as Consulting Physician (General Surgery) Rulon Eisenmenger, MD as Consulting Physician (Hematology and Oncology) [name], MD as Attending Physician (Radiation Oncology)  DIAGNOSIS: Breast cancer of upper-outer quadrant of left female breast   Staging form: Breast, AJCC 7th Edition     Clinical: Stage IIB (T3, N0, cM0) - Unsigned       Staging comments: Staged at breast conference 12/31/13.      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 with lymphovascular invasion margins negative one out of 4 SLN positive, ER 99%, PR 100%, HER-2 negative ratio 0.74, T2, N1, M0 stage IIB   03/19/2014 PET scan No evidence of distant metastatic disease   03/31/2014 -  Chemotherapy Adjuvant chemotherapy with dose dense Adriamycin Cytoxan x4 followed by Abraxane weekly x12    CHIEF COMPLIANT: cycle 2 day 8 of chemotherapy  INTERVAL HISTORY: Michelle Kennedy is a 58 year old Caucasian with above-mentioned history of left-sided breast cancer treated with lumpectomy and is on adjuvant chemotherapy with dose dense Adriamycin Cytoxan. Today is cycle 2 day 8 of chemotherapy. She has tolerated cycle 2 much better than cycle 1. She did have diarrhea on day 4 but it resolved with Imodium. She did not have any nausea vomiting. She denies any  fevers or chills. She does feel fairly fatigued. She had alopecia and is very sad about that.  REVIEW OF SYSTEMS:   Constitutional: alopecia, fatigue Eyes: Denies blurriness of vision Ears, nose, mouth, throat, and face: Denies mucositis or sore throat Respiratory: Denies cough, dyspnea or wheezes Cardiovascular: Denies palpitation, chest discomfort or lower extremity swelling Gastrointestinal:  Denies nausea, heartburn or change in bowel habits Skin: Denies abnormal skin rashes Lymphatics: Denies new lymphadenopathy or easy bruising Neurological:Denies numbness, tingling or new weaknesses Behavioral/Psych: Mood is stable, no new changes  All other systems were reviewed with the patient and are negative.  I have reviewed the past medical history, past surgical history, social history and family history with the patient and they are unchanged from previous note.  ALLERGIES:  is allergic to bee venom; contrast media; iodine; latex; penicillins; shellfish allergy; aspirin; erythromycin; lidocaine; symbicort; codeine; compazine; pentazocine; pentazocine lactate; sulfamethoxazole; and sulfonamide derivatives.  MEDICATIONS:  Current Outpatient Prescriptions  Medication Sig Dispense Refill  . albuterol (PROVENTIL HFA;VENTOLIN HFA) 108 (90 BASE) MCG/ACT inhaler Inhale 2 puffs into the lungs every 6 (six) hours as needed for wheezing or shortness of breath. Please dispense Proair 8.5 g 5  . albuterol (PROVENTIL) (5 MG/ML) 0.5% nebulizer solution Take 2.5 mg by nebulization every 6 (six) hours as needed for wheezing or shortness of breath.    . benzonatate (TESSALON) 200 MG capsule Take 1 capsule (200 mg total) by mouth 3 (three) times daily as needed for cough. 30 capsule 1  . Calcium Carbonate (CALCIUM 500 PO) Take 1 tablet by mouth 2 (  two) times daily.      . cephALEXin (KEFLEX) 500 MG capsule Take 500 mg by mouth 4 (four) times daily.    Marland Kitchen dexamethasone (DECADRON) 4 MG tablet Take 2 tablets by  mouth once a day on the day after chemotherapy and then take 2 tablets two times a day for 2 days. Take with food. 30 tablet 1  . diphenhydrAMINE (BENADRYL) 25 MG tablet Take 1 tablet an hour before MRI.  Then take 1 tablet at bedtime as needed, per your usual regimen. 30 tablet 0  . DULoxetine (CYMBALTA) 60 MG capsule Take 1 capsule (60 mg total) by mouth daily. 90 capsule 1  . EPINEPHrine 0.3 mg/0.3 mL IJ SOAJ injection Inject 0.3 mLs (0.3 mg total) into the muscle once. 2 Device 0  . Ergocalciferol (VITAMIN D2) 400 UNITS TABS Take 1 tablet by mouth daily.    . Fluticasone-Salmeterol (ADVAIR DISKUS) 250-50 MCG/DOSE AEPB Inhale 1 puff into the lungs 2 (two) times daily. 180 each 1  . levothyroxine (SYNTHROID, LEVOTHROID) 150 MCG tablet Take 1 tablet (150 mcg total) by mouth daily. 30 tablet 1  . LORazepam (ATIVAN) 0.5 MG tablet Take 1 tablet (0.5 mg total) by mouth every 6 (six) hours as needed (Nausea or vomiting). 30 tablet 0  . meloxicam (MOBIC) 7.5 MG tablet TAKE 1 TABLET DAILY 90 tablet 0  . methylPREDNISolone (MEDROL) 4 MG tablet     . Multiple Vitamin (MULTIVITAMIN) tablet Take 1 tablet by mouth daily.      . nitroGLYCERIN (NITROSTAT) 0.4 MG SL tablet Place 1 tablet (0.4 mg total) under the tongue every 5 (five) minutes as needed for chest pain. 30 tablet 0  . Omega-3 Fatty Acids (FISH OIL) 1200 MG CAPS Take 1 capsule by mouth daily.      Marland Kitchen omeprazole (PRILOSEC) 40 MG capsule TAKE ONE CAPSULE BY MOUTH EVERY DAY 90 capsule 3  . ondansetron (ZOFRAN) 8 MG tablet Take 1 tablet (8 mg total) by mouth 2 (two) times daily as needed. Start on the third day after chemotherapy. 30 tablet 1  . oxybutynin (DITROPAN-XL) 5 MG 24 hr tablet Take 5 mg by mouth daily.    Marland Kitchen oxyCODONE-acetaminophen (ROXICET) 5-325 MG per tablet 1-2 tablets q 4h prn 20 tablet 0  . prochlorperazine (COMPAZINE) 10 MG tablet   1  . simvastatin (ZOCOR) 40 MG tablet TAKE 1 TABLET (40MG) BY MOUTH DAILY 90 tablet 1  . traMADol  (ULTRAM) 50 MG tablet      No current facility-administered medications for this visit.    PHYSICAL EXAMINATION: ECOG PERFORMANCE STATUS: 1 - Symptomatic but completely ambulatory  Filed Vitals:   04/21/14 1545  BP: 138/85  Pulse: 83  Temp: 97.8 F (36.6 C)  Resp: 20   Filed Weights   04/21/14 1545  Weight: 263 lb 8 oz (119.523 kg)    GENERAL:alert, no distress and comfortable SKIN: skin color, texture, turgor are normal, no rashes or significant lesions EYES: normal, Conjunctiva are pink and non-injected, sclera clear OROPHARYNX:no exudate, no erythema and lips, buccal mucosa, and tongue normal  NECK: supple, thyroid normal size, non-tender, without nodularity LYMPH:  no palpable lymphadenopathy in the cervical, axillary or inguinal LUNGS: clear to auscultation and percussion with normal breathing effort HEART: regular rate & rhythm and no murmurs and no lower extremity edema ABDOMEN:abdomen soft, non-tender and normal bowel sounds Musculoskeletal:no cyanosis of digits and no clubbing  NEURO: alert & oriented x 3 with fluent speech, no focal motor/sensory deficits  LABORATORY DATA:  I have reviewed the data as listed   Chemistry      Component Value Date/Time   NA 139 04/21/2014 1534   NA 140 05/31/2011 0905   K 4.2 04/21/2014 1534   K 4.6 05/31/2011 0905   CL 106 05/31/2011 0905   CO2 28 04/21/2014 1534   CO2 24 05/31/2011 0905   BUN 9.2 04/21/2014 1534   BUN 17 05/31/2011 0905   CREATININE 0.8 04/21/2014 1534   CREATININE 0.83 05/31/2011 0905   CREATININE 0.73 12/21/2009 1937      Component Value Date/Time   CALCIUM 9.5 04/21/2014 1534   CALCIUM 9.4 05/31/2011 0905   ALKPHOS 73 04/21/2014 1534   ALKPHOS 52 08/11/2013 0918   AST 12 04/21/2014 1534   AST 19 08/11/2013 0918   ALT 41 04/21/2014 1534   ALT 24 08/11/2013 0918   BILITOT 0.46 04/21/2014 1534   BILITOT 0.4 08/11/2013 0918       Lab Results  Component Value Date   WBC 0.9* 04/21/2014    HGB 12.3 04/21/2014   HCT 37.7 04/21/2014   MCV 75.4* 04/21/2014   PLT 216 04/21/2014   NEUTROABS 0.1* 04/21/2014    ASSESSMENT & PLAN:  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 M0 stage IIB  Today is cycle 2 day 8 of chemotherapy with dose dense Adriamycin Cytoxan. Patient experienced falling toxicities of chemotherapy 1. Chemotherapy-induced neutropenia 2. Chemotherapy-induced diarrhea but much improved with Imodium 3. Esophageal spasms diminished patient did use nitroglycerin once 4. Fatigue related to chemotherapy 5. Alopecia  Severe neutropenia: Patient has an absolute neutrophil count of 0.1. Because of her severe fatigue and severe neutropenia, I reduced the dosage of next chemotherapy Adriamycin 50 mg per meter square and Cytoxan dose 500 mg per meter square. I discussed with her about neutropenic precautions.  Return to clinic in one week for blood count check and for cycle 3 of chemotherapy   Orders Placed This Encounter  Procedures  . CBC with Differential    Standing Status: Future     Number of Occurrences:      Standing Expiration Date: 04/21/2015  . Comprehensive metabolic panel (Cmet) - [facility]    Standing Status: Future     Number of Occurrences:      Standing Expiration Date: 04/21/2015   The patient has a good understanding of the overall plan. she agrees with it. She will call with any problems that may develop before her next visit here.   Rulon Eisenmenger, MD 04/21/2014 4:23 PM

## 2014-04-21 NOTE — Assessment & Plan Note (Signed)
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 M0 stage IIB  Today is cycle 2 day 8 of chemotherapy with dose dense Adriamycin Cytoxan. Patient experienced falling toxicities of chemotherapy 1. Chemotherapy-induced neutropenia 2. Chemotherapy-induced diarrhea but much improved with Imodium 3. Esophageal spasms diminished patient did use nitroglycerin once 4. Fatigue related to chemotherapy 5. Alopecia  Severe neutropenia: Patient has an absolute neutrophil count of 0.1. Because of her severe fatigue and severe neutropenia, I reduced the dosage of next chemotherapy Adriamycin 50 mg per meter square and Cytoxan dose 500 mg per meter square. I discussed with her about neutropenic precautions.  Return to clinic in one week for blood count check and for cycle 3 of chemotherapy 

## 2014-04-22 ENCOUNTER — Telehealth: Payer: Self-pay | Admitting: *Deleted

## 2014-04-22 ENCOUNTER — Ambulatory Visit: Payer: BC Managed Care – PPO

## 2014-04-22 ENCOUNTER — Telehealth: Payer: Self-pay | Admitting: Hematology and Oncology

## 2014-04-22 NOTE — Telephone Encounter (Signed)
, °

## 2014-04-22 NOTE — Telephone Encounter (Signed)
Per staff message and POF I have scheduled appts. Advised scheduler of appts. JMW  

## 2014-04-28 ENCOUNTER — Other Ambulatory Visit (HOSPITAL_BASED_OUTPATIENT_CLINIC_OR_DEPARTMENT_OTHER): Payer: BC Managed Care – PPO

## 2014-04-28 ENCOUNTER — Telehealth: Payer: Self-pay | Admitting: Hematology and Oncology

## 2014-04-28 ENCOUNTER — Other Ambulatory Visit: Payer: Self-pay | Admitting: *Deleted

## 2014-04-28 ENCOUNTER — Ambulatory Visit (HOSPITAL_BASED_OUTPATIENT_CLINIC_OR_DEPARTMENT_OTHER): Payer: BC Managed Care – PPO

## 2014-04-28 ENCOUNTER — Ambulatory Visit (HOSPITAL_BASED_OUTPATIENT_CLINIC_OR_DEPARTMENT_OTHER): Payer: BC Managed Care – PPO | Admitting: Hematology and Oncology

## 2014-04-28 DIAGNOSIS — Z5111 Encounter for antineoplastic chemotherapy: Secondary | ICD-10-CM

## 2014-04-28 DIAGNOSIS — R5383 Other fatigue: Secondary | ICD-10-CM

## 2014-04-28 DIAGNOSIS — C50412 Malignant neoplasm of upper-outer quadrant of left female breast: Secondary | ICD-10-CM

## 2014-04-28 DIAGNOSIS — R197 Diarrhea, unspecified: Secondary | ICD-10-CM

## 2014-04-28 DIAGNOSIS — D702 Other drug-induced agranulocytosis: Secondary | ICD-10-CM

## 2014-04-28 LAB — COMPREHENSIVE METABOLIC PANEL (CC13)
ALT: 29 U/L (ref 0–55)
AST: 17 U/L (ref 5–34)
Albumin: 3.6 g/dL (ref 3.5–5.0)
Alkaline Phosphatase: 70 U/L (ref 40–150)
Anion Gap: 13 mEq/L — ABNORMAL HIGH (ref 3–11)
BILIRUBIN TOTAL: 0.22 mg/dL (ref 0.20–1.20)
BUN: 9.3 mg/dL (ref 7.0–26.0)
CALCIUM: 9.2 mg/dL (ref 8.4–10.4)
CHLORIDE: 107 meq/L (ref 98–109)
CO2: 23 mEq/L (ref 22–29)
CREATININE: 0.9 mg/dL (ref 0.6–1.1)
EGFR: 75 mL/min/{1.73_m2} — ABNORMAL LOW (ref >90)
Glucose: 131 mg/dl (ref 70–140)
Potassium: 3.8 mEq/L (ref 3.5–5.1)
Sodium: 143 mEq/L (ref 136–145)
Total Protein: 6.4 g/dL (ref 6.4–8.3)

## 2014-04-28 LAB — CBC WITH DIFFERENTIAL/PLATELET
BASO%: 0.7 % (ref 0.0–2.0)
BASOS ABS: 0 10*3/uL (ref 0.0–0.1)
EOS%: 0.4 % (ref 0.0–7.0)
Eosinophils Absolute: 0 10*3/uL (ref 0.0–0.5)
HCT: 40.4 % (ref 34.8–46.6)
HEMOGLOBIN: 12.6 g/dL (ref 11.6–15.9)
LYMPH%: 18.2 % (ref 14.0–49.7)
MCH: 24.1 pg — ABNORMAL LOW (ref 25.1–34.0)
MCHC: 31.2 g/dL — ABNORMAL LOW (ref 31.5–36.0)
MCV: 77.4 fL — AB (ref 79.5–101.0)
MONO#: 0.4 10*3/uL (ref 0.1–0.9)
MONO%: 7.6 % (ref 0.0–14.0)
NEUT#: 4.3 10*3/uL (ref 1.5–6.5)
NEUT%: 73.1 % (ref 38.4–76.8)
PLATELETS: 185 10*3/uL (ref 145–400)
RBC: 5.22 10*6/uL (ref 3.70–5.45)
RDW: 15.8 % — ABNORMAL HIGH (ref 11.2–14.5)
WBC: 5.8 10*3/uL (ref 3.9–10.3)
lymph#: 1.1 10*3/uL (ref 0.9–3.3)

## 2014-04-28 MED ORDER — HEPARIN SOD (PORK) LOCK FLUSH 100 UNIT/ML IV SOLN
500.0000 [IU] | Freq: Once | INTRAVENOUS | Status: AC | PRN
  Administered 2014-04-28: 500 [IU]
  Filled 2014-04-28: qty 5

## 2014-04-28 MED ORDER — DOXORUBICIN HCL CHEMO IV INJECTION 2 MG/ML
50.0000 mg/m2 | Freq: Once | INTRAVENOUS | Status: AC
  Administered 2014-04-28: 120 mg via INTRAVENOUS
  Filled 2014-04-28: qty 60

## 2014-04-28 MED ORDER — SODIUM CHLORIDE 0.9 % IV SOLN
150.0000 mg | Freq: Once | INTRAVENOUS | Status: AC
  Administered 2014-04-28: 150 mg via INTRAVENOUS
  Filled 2014-04-28: qty 5

## 2014-04-28 MED ORDER — SODIUM CHLORIDE 0.9 % IV SOLN
500.0000 mg/m2 | Freq: Once | INTRAVENOUS | Status: AC
  Administered 2014-04-28: 1200 mg via INTRAVENOUS
  Filled 2014-04-28: qty 60

## 2014-04-28 MED ORDER — SODIUM CHLORIDE 0.9 % IV SOLN
Freq: Once | INTRAVENOUS | Status: AC
  Administered 2014-04-28: 16:00:00 via INTRAVENOUS

## 2014-04-28 MED ORDER — DEXAMETHASONE SODIUM PHOSPHATE 10 MG/ML IJ SOLN
INTRAMUSCULAR | Status: AC
  Filled 2014-04-28: qty 1

## 2014-04-28 MED ORDER — DEXAMETHASONE SODIUM PHOSPHATE 10 MG/ML IJ SOLN
10.0000 mg | Freq: Once | INTRAMUSCULAR | Status: AC
  Administered 2014-04-28: 10 mg via INTRAVENOUS

## 2014-04-28 MED ORDER — PALONOSETRON HCL INJECTION 0.25 MG/5ML
INTRAVENOUS | Status: AC
  Filled 2014-04-28: qty 5

## 2014-04-28 MED ORDER — PALONOSETRON HCL INJECTION 0.25 MG/5ML
0.2500 mg | Freq: Once | INTRAVENOUS | Status: AC
  Administered 2014-04-28: 0.25 mg via INTRAVENOUS

## 2014-04-28 MED ORDER — DEXAMETHASONE SODIUM PHOSPHATE 10 MG/ML IJ SOLN
INTRAMUSCULAR | Status: AC
Start: 2014-04-28 — End: 2014-04-28
  Filled 2014-04-28: qty 1

## 2014-04-28 MED ORDER — SODIUM CHLORIDE 0.9 % IJ SOLN
10.0000 mL | INTRAMUSCULAR | Status: DC | PRN
  Administered 2014-04-28: 10 mL
  Filled 2014-04-28: qty 10

## 2014-04-28 NOTE — Patient Instructions (Signed)
[facility] Discharge Instructions for Patients Receiving Chemotherapy  Today you received the following chemotherapy agents Adriamycin, Cytoxan.  To help prevent nausea and vomiting after your treatment, we encourage you to take your nausea medication zofran, compazine.   If you develop nausea and vomiting that is not controlled by your nausea medication, call the clinic.   BELOW ARE SYMPTOMS THAT SHOULD BE REPORTED IMMEDIATELY:  *FEVER GREATER THAN 100.5 F  *CHILLS WITH OR WITHOUT FEVER  NAUSEA AND VOMITING THAT IS NOT CONTROLLED WITH YOUR NAUSEA MEDICATION  *UNUSUAL SHORTNESS OF BREATH  *UNUSUAL BRUISING OR BLEEDING  TENDERNESS IN MOUTH AND THROAT WITH OR WITHOUT PRESENCE OF ULCERS  *URINARY PROBLEMS  *BOWEL PROBLEMS  UNUSUAL RASH Items with * indicate a potential emergency and should be followed up as soon as possible.  Feel free to call the clinic you have any questions or concerns. The clinic phone number is [phone].

## 2014-04-28 NOTE — Progress Notes (Signed)
Patient Care Team: Debbrah Alar, NP as PCP - General (Internal Medicine) Excell Seltzer, MD as Consulting Physician (General Surgery) Rulon Eisenmenger, MD as Consulting Physician (Hematology and Oncology) [name], MD as Attending Physician (Radiation Oncology)  DIAGNOSIS: Breast cancer of upper-outer quadrant of left female breast   Staging form: Breast, AJCC 7th Edition     Clinical: Stage IIB (T3, N0, cM0) - Unsigned       Staging comments: Staged at breast conference 12/31/13.      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 with lymphovascular invasion margins negative one out of 4 SLN positive, ER 99%, PR 100%, HER-2 negative ratio 0.74, T2, N1, M0 stage IIB   03/19/2014 PET scan No evidence of distant metastatic disease   03/31/2014 -  Chemotherapy Adjuvant chemotherapy with dose dense Adriamycin Cytoxan x4 followed by Abraxane weekly x12    CHIEF COMPLIANT: cycle 3 day 1 of dose dense Adriamycin and Cytoxan  INTERVAL HISTORY: Michelle Kennedy is a 58 year old Caucasian with above-mentioned history of left-sided breast cancer treated with lumpectomy followed by adjuvant chemotherapy with dose dense Adriamycin and Cytoxan. Today is cycle 3 day 1 of chemotherapy. After the last cycle she had profound neutropenia and occasional diarrhea. All of these symptoms have resolved and she is feeling back to her  normal self. She has excellent appetite. She denies any problems with taste. She had alopecia. Denies any fevers or chills nausea vomiting.  REVIEW OF SYSTEMS:   Constitutional: Denies fevers, chills or abnormal weight loss Eyes: Denies blurriness of vision Ears, nose, mouth, throat, and face: Denies mucositis or sore throat Respiratory: Denies cough, dyspnea or wheezes Cardiovascular: Denies palpitation, chest discomfort or lower extremity swelling Gastrointestinal:  Denies nausea, heartburn or change in bowel habits Skin: Denies abnormal skin rashes Lymphatics: Denies new lymphadenopathy or easy bruising Neurological:Denies numbness, tingling or new weaknesses Behavioral/Psych: Mood is stable, no new changes  Breast:  denies any pain or lumps or nodules in either breasts All other systems were reviewed with the patient and are negative.  I have reviewed the past medical history, past surgical history, social history and family history with the patient and they are unchanged from previous note.  ALLERGIES:  is allergic to bee venom; contrast media; iodine; latex; penicillins; shellfish allergy; aspirin; erythromycin; lidocaine; symbicort; codeine; compazine; pentazocine; pentazocine lactate; sulfamethoxazole; and sulfonamide derivatives.  MEDICATIONS:  Current Outpatient Prescriptions  Medication Sig Dispense Refill  . albuterol (PROVENTIL HFA;VENTOLIN HFA) 108 (90 BASE) MCG/ACT inhaler Inhale 2 puffs into the lungs every 6 (six) hours as needed for wheezing or shortness of breath. Please dispense Proair 8.5 g 5  . albuterol (PROVENTIL) (5 MG/ML) 0.5% nebulizer solution Take 2.5 mg by nebulization every 6 (six) hours as needed for wheezing or shortness of breath.    . benzonatate (TESSALON) 200 MG capsule Take 1 capsule (200 mg total) by mouth 3 (three) times daily as needed  for cough. 30 capsule 1  . Calcium Carbonate (CALCIUM 500 PO) Take 1 tablet by mouth 2 (two) times daily.      .  cephALEXin (KEFLEX) 500 MG capsule Take 500 mg by mouth 4 (four) times daily.    Marland Kitchen dexamethasone (DECADRON) 4 MG tablet Take 2 tablets by mouth once a day on the day after chemotherapy and then take 2 tablets two times a day for 2 days. Take with food. 30 tablet 1  . diphenhydrAMINE (BENADRYL) 25 MG tablet Take 1 tablet an hour before MRI.  Then take 1 tablet at bedtime as needed, per your usual regimen. 30 tablet 0  . DULoxetine (CYMBALTA) 60 MG capsule Take 1 capsule (60 mg total) by mouth daily. 90 capsule 1  . EPINEPHrine 0.3 mg/0.3 mL IJ SOAJ injection Inject 0.3 mLs (0.3 mg total) into the muscle once. 2 Device 0  . Ergocalciferol (VITAMIN D2) 400 UNITS TABS Take 1 tablet by mouth daily.    . Fluticasone-Salmeterol (ADVAIR DISKUS) 250-50 MCG/DOSE AEPB Inhale 1 puff into the lungs 2 (two) times daily. 180 each 1  . levothyroxine (SYNTHROID, LEVOTHROID) 150 MCG tablet Take 1 tablet (150 mcg total) by mouth daily. 30 tablet 1  . LORazepam (ATIVAN) 0.5 MG tablet Take 1 tablet (0.5 mg total) by mouth every 6 (six) hours as needed (Nausea or vomiting). 30 tablet 0  . meloxicam (MOBIC) 7.5 MG tablet TAKE 1 TABLET DAILY 90 tablet 0  . methylPREDNISolone (MEDROL) 4 MG tablet     . Multiple Vitamin (MULTIVITAMIN) tablet Take 1 tablet by mouth daily.      . nitroGLYCERIN (NITROSTAT) 0.4 MG SL tablet Place 1 tablet (0.4 mg total) under the tongue every 5 (five) minutes as needed for chest pain. 30 tablet 0  . Omega-3 Fatty Acids (FISH OIL) 1200 MG CAPS Take 1 capsule by mouth daily.      Marland Kitchen omeprazole (PRILOSEC) 40 MG capsule TAKE ONE CAPSULE BY MOUTH EVERY DAY 90 capsule 3  . ondansetron (ZOFRAN) 8 MG tablet Take 1 tablet (8 mg total) by mouth 2 (two) times daily as needed. Start on the third day after chemotherapy. 30 tablet 1  . oxybutynin (DITROPAN-XL) 5 MG 24 hr tablet Take 5 mg by mouth daily.    Marland Kitchen oxyCODONE-acetaminophen (ROXICET) 5-325 MG per tablet 1-2 tablets q 4h prn 20 tablet 0  .  prochlorperazine (COMPAZINE) 10 MG tablet   1  . simvastatin (ZOCOR) 40 MG tablet TAKE 1 TABLET (40MG) BY MOUTH DAILY 90 tablet 1  . traMADol (ULTRAM) 50 MG tablet      No current facility-administered medications for this visit.    PHYSICAL EXAMINATION: ECOG PERFORMANCE STATUS: 1 - Symptomatic but completely ambulatory  Filed Vitals:   04/28/14 1400  BP: 154/88  Pulse: 95  Temp: 98.4 F (36.9 C)  Resp: 20   Filed Weights   04/28/14 1400  Weight: 265 lb 3.2 oz (120.294 kg)    GENERAL:alert, no distress and comfortable SKIN: skin color, texture, turgor are normal, no rashes or significant lesions EYES: normal, Conjunctiva are pink and non-injected, sclera clear OROPHARYNX:no exudate, no erythema and lips, buccal mucosa, and tongue normal  NECK: supple, thyroid normal size, non-tender, without nodularity LYMPH:  no palpable lymphadenopathy in the cervical, axillary or inguinal LUNGS: clear to auscultation and percussion with normal breathing effort HEART: regular rate & rhythm and no murmurs and no lower extremity edema ABDOMEN:abdomen soft, non-tender and normal bowel sounds Musculoskeletal:no cyanosis of digits  and no clubbing  NEURO: alert & oriented x 3 with fluent speech, no focal motor/sensory deficits  LABORATORY DATA:  I have reviewed the data as listed   Chemistry      Component Value Date/Time   NA 143 04/28/2014 1332   NA 140 05/31/2011 0905   K 3.8 04/28/2014 1332   K 4.6 05/31/2011 0905   CL 106 05/31/2011 0905   CO2 23 04/28/2014 1332   CO2 24 05/31/2011 0905   BUN 9.3 04/28/2014 1332   BUN 17 05/31/2011 0905   CREATININE 0.9 04/28/2014 1332   CREATININE 0.83 05/31/2011 0905   CREATININE 0.73 12/21/2009 1937      Component Value Date/Time   CALCIUM 9.2 04/28/2014 1332   CALCIUM 9.4 05/31/2011 0905   ALKPHOS 70 04/28/2014 1332   ALKPHOS 52 08/11/2013 0918   AST 17 04/28/2014 1332   AST 19 08/11/2013 0918   ALT 29 04/28/2014 1332   ALT 24  08/11/2013 0918   BILITOT 0.22 04/28/2014 1332   BILITOT 0.4 08/11/2013 0918       Lab Results  Component Value Date   WBC 5.8 04/28/2014   HGB 12.6 04/28/2014   HCT 40.4 04/28/2014   MCV 77.4* 04/28/2014   PLT 185 04/28/2014   NEUTROABS 4.3 04/28/2014    ASSESSMENT & PLAN:  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 M0 stage IIB  Today is cycle 3 day 1 of chemotherapy with dose dense Adriamycin Cytoxan.  Patient experienced the following toxicities to chemotherapy 1. Chemotherapy-induced neutropenia: recovered by day 1 of chemotherapy 2. Chemotherapy-induced diarrhea resolved with Imodium 3. Esophageal spasms diminished  4. Fatigue related to chemotherapy 5. Alopecia  Monitoring closely for chemotherapy toxicities. Since the patient is doing extremely well, we may not need to see her back next week for toxicity check. She would like to hold off on chemotherapy on December 22 because of Christmas time. We will set her up for December 29 for cycle 4 of adjuvant dose dense chemotherapy with Adriamycin and Cytoxan.   Orders Placed This Encounter  Procedures  . CBC with Differential    Standing Status: Future     Number of Occurrences:      Standing Expiration Date: 04/28/2015  . Comprehensive metabolic panel (Cmet) - [facility]    Standing Status: Future     Number of Occurrences:      Standing Expiration Date: 04/28/2015   The patient has a good understanding of the overall plan. she agrees with it. She will call with any problems that may develop before her next visit here.   Rulon Eisenmenger, MD 04/28/2014 2:32 PM

## 2014-04-28 NOTE — Assessment & Plan Note (Signed)
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 M0 stage IIB  Today is cycle 3 day 1 of chemotherapy with dose dense Adriamycin Cytoxan.  Patient experienced the following toxicities to chemotherapy 1. Chemotherapy-induced neutropenia: recovered by day 1 of chemotherapy 2. Chemotherapy-induced diarrhea resolved with Imodium 3. Esophageal spasms diminished  4. Fatigue related to chemotherapy 5. Alopecia  Monitoring closely for chemotherapy since it is. Since the patient is doing extremely well, we may not need to see her back next week for toxicity check. She would like to hold off on chemotherapy on December 22 because of Christmas time. We will set her up for December 29 for cycle 4 of adjuvant dose dense chemotherapy with Adriamycin and Cytoxan.

## 2014-04-28 NOTE — Progress Notes (Signed)
Discharged at 1730, ambulatory in no distress.

## 2014-04-29 ENCOUNTER — Ambulatory Visit (HOSPITAL_BASED_OUTPATIENT_CLINIC_OR_DEPARTMENT_OTHER): Payer: BC Managed Care – PPO

## 2014-04-29 ENCOUNTER — Other Ambulatory Visit: Payer: Self-pay | Admitting: *Deleted

## 2014-04-29 DIAGNOSIS — Z5189 Encounter for other specified aftercare: Secondary | ICD-10-CM

## 2014-04-29 DIAGNOSIS — C50412 Malignant neoplasm of upper-outer quadrant of left female breast: Secondary | ICD-10-CM

## 2014-04-29 MED ORDER — OMEPRAZOLE 40 MG PO CPDR: 40.0000 mg | DELAYED_RELEASE_CAPSULE | Freq: Every day | ORAL | Status: DC

## 2014-04-29 MED ORDER — PEGFILGRASTIM INJECTION 6 MG/0.6ML ~~LOC~~
6.0000 mg | PREFILLED_SYRINGE | Freq: Once | SUBCUTANEOUS | Status: AC
  Administered 2014-04-29: 6 mg via SUBCUTANEOUS
  Filled 2014-04-29: qty 0.6

## 2014-04-29 NOTE — Telephone Encounter (Signed)
Omeprazole refilled per protocol. E-scribed to [facility]. JG//CMA

## 2014-04-29 NOTE — Patient Instructions (Signed)
Pegfilgrastim injection What is this medicine? PEGFILGRASTIM (peg fil GRA stim) is a long-acting granulocyte colony-stimulating factor that stimulates the growth of neutrophils, a type of white blood cell important in the body's fight against infection. It is used to reduce the incidence of fever and infection in patients with certain types of cancer who are receiving chemotherapy that affects the bone marrow. This medicine may be used for other purposes; ask your health care provider or pharmacist if you have questions. COMMON BRAND NAME(S): Neulasta What should I tell my health care provider before I take this medicine? They need to know if you have any of these conditions: -latex allergy -ongoing radiation therapy -sickle cell disease -skin reactions to acrylic adhesives (On-Body Injector only) -an unusual or allergic reaction to pegfilgrastim, filgrastim, other medicines, foods, dyes, or preservatives -pregnant or trying to get pregnant -breast-feeding How should I use this medicine? This medicine is for injection under the skin. If you get this medicine at home, you will be taught how to prepare and give the pre-filled syringe or how to use the On-body Injector. Refer to the patient Instructions for Use for detailed instructions. Use exactly as directed. Take your medicine at regular intervals. Do not take your medicine more often than directed. It is important that you put your used needles and syringes in a special sharps container. Do not put them in a trash can. If you do not have a sharps container, call your pharmacist or healthcare provider to get one. Talk to your pediatrician regarding the use of this medicine in children. Special care may be needed. Overdosage: If you think you have taken too much of this medicine contact a poison control center or emergency room at once. NOTE: This medicine is only for you. Do not share this medicine with others. What if I miss a dose? It is  important not to miss your dose. Call your doctor or health care professional if you miss your dose. If you miss a dose due to an On-body Injector failure or leakage, a new dose should be administered as soon as possible using a single prefilled syringe for manual use. What may interact with this medicine? Interactions have not been studied. Give your health care provider a list of all the medicines, herbs, non-prescription drugs, or dietary supplements you use. Also tell them if you smoke, drink alcohol, or use illegal drugs. Some items may interact with your medicine. This list may not describe all possible interactions. Give your health care provider a list of all the medicines, herbs, non-prescription drugs, or dietary supplements you use. Also tell them if you smoke, drink alcohol, or use illegal drugs. Some items may interact with your medicine. What should I watch for while using this medicine? You may need blood work done while you are taking this medicine. If you are going to need a MRI, CT scan, or other procedure, tell your doctor that you are using this medicine (On-Body Injector only). What side effects may I notice from receiving this medicine? Side effects that you should report to your doctor or health care professional as soon as possible: -allergic reactions like skin rash, itching or hives, swelling of the face, lips, or tongue -dizziness -fever -pain, redness, or irritation at site where injected -pinpoint red spots on the skin -shortness of breath or breathing problems -stomach or side pain, or pain at the shoulder -swelling -tiredness -trouble passing urine Side effects that usually do not require medical attention (report to your doctor   or health care professional if they continue or are bothersome): -bone pain -muscle pain This list may not describe all possible side effects. Call your doctor for medical advice about side effects. You may report side effects to FDA at  1-800-FDA-1088. Where should I keep my medicine? Keep out of the reach of children. Store pre-filled syringes in a refrigerator between 2 and 8 degrees C (36 and 46 degrees F). Do not freeze. Keep in carton to protect from light. Throw away this medicine if it is left out of the refrigerator for more than 48 hours. Throw away any unused medicine after the expiration date. NOTE: This sheet is a summary. It may not cover all possible information. If you have questions about this medicine, talk to your doctor, pharmacist, or health care provider.  2015, [employer] Standard. (2013-08-07 16:14:05)  

## 2014-05-01 ENCOUNTER — Telehealth: Payer: Self-pay

## 2014-05-01 NOTE — Telephone Encounter (Signed)
LMOVM - per Dr. Lindi Adie, pt is ok to travel to [place].  Pt to call clinic with questions.

## 2014-05-05 ENCOUNTER — Other Ambulatory Visit: Payer: BC Managed Care – PPO

## 2014-05-05 ENCOUNTER — Ambulatory Visit: Payer: BC Managed Care – PPO | Admitting: Hematology and Oncology

## 2014-05-06 ENCOUNTER — Ambulatory Visit: Payer: BC Managed Care – PPO

## 2014-05-06 ENCOUNTER — Telehealth: Payer: Self-pay | Admitting: *Deleted

## 2014-05-06 NOTE — Telephone Encounter (Signed)
Patient called reporting her "fingers and nails are blueish at the end and peeling.  I'm in [place] and don't know if it is the altitude or the chemotherapy."  Denies any open areas or drainage.  Advised her to keep hands clean and moisturized and to notify us if changes.  Does not know how long she will be in [place].  Family member may die is why they are there.

## 2014-05-11 ENCOUNTER — Other Ambulatory Visit: Payer: Self-pay | Admitting: Family

## 2014-05-11 NOTE — Telephone Encounter (Signed)
90 day supply of simvastatin sent to pharmacy. Pt is due for routine follow up with Melissa.  Please call pt to arrange appt in the next 30 days.

## 2014-05-12 ENCOUNTER — Ambulatory Visit: Payer: BC Managed Care – PPO | Admitting: Hematology and Oncology

## 2014-05-12 ENCOUNTER — Other Ambulatory Visit: Payer: BC Managed Care – PPO

## 2014-05-13 ENCOUNTER — Ambulatory Visit: Payer: BC Managed Care – PPO

## 2014-05-19 ENCOUNTER — Ambulatory Visit (HOSPITAL_BASED_OUTPATIENT_CLINIC_OR_DEPARTMENT_OTHER): Payer: BC Managed Care – PPO

## 2014-05-19 ENCOUNTER — Other Ambulatory Visit: Payer: Self-pay | Admitting: Hematology and Oncology

## 2014-05-19 ENCOUNTER — Other Ambulatory Visit (HOSPITAL_BASED_OUTPATIENT_CLINIC_OR_DEPARTMENT_OTHER): Payer: BC Managed Care – PPO

## 2014-05-19 ENCOUNTER — Ambulatory Visit (HOSPITAL_BASED_OUTPATIENT_CLINIC_OR_DEPARTMENT_OTHER): Payer: BC Managed Care – PPO | Admitting: Hematology and Oncology

## 2014-05-19 VITALS — BP 131/82 | HR 99 | Temp 98.1°F | Resp 18 | Ht 68.0 in | Wt 261.6 lb

## 2014-05-19 DIAGNOSIS — Z5111 Encounter for antineoplastic chemotherapy: Secondary | ICD-10-CM

## 2014-05-19 DIAGNOSIS — C50412 Malignant neoplasm of upper-outer quadrant of left female breast: Secondary | ICD-10-CM

## 2014-05-19 DIAGNOSIS — D701 Agranulocytosis secondary to cancer chemotherapy: Secondary | ICD-10-CM

## 2014-05-19 DIAGNOSIS — Z170 Estrogen receptor positive status [ER+]: Secondary | ICD-10-CM

## 2014-05-19 LAB — CBC WITH DIFFERENTIAL/PLATELET
BASO%: 0.8 % (ref 0.0–2.0)
Basophils Absolute: 0.1 10*3/uL (ref 0.0–0.1)
EOS ABS: 0.2 10*3/uL (ref 0.0–0.5)
EOS%: 2.1 % (ref 0.0–7.0)
HEMATOCRIT: 38.1 % (ref 34.8–46.6)
HEMOGLOBIN: 12 g/dL (ref 11.6–15.9)
LYMPH%: 13.2 % — AB (ref 14.0–49.7)
MCH: 24.4 pg — ABNORMAL LOW (ref 25.1–34.0)
MCHC: 31.4 g/dL — ABNORMAL LOW (ref 31.5–36.0)
MCV: 77.5 fL — AB (ref 79.5–101.0)
MONO#: 1.5 10*3/uL — ABNORMAL HIGH (ref 0.1–0.9)
MONO%: 16.9 % — AB (ref 0.0–14.0)
NEUT%: 67 % (ref 38.4–76.8)
NEUTROS ABS: 6.1 10*3/uL (ref 1.5–6.5)
Platelets: 339 10*3/uL (ref 145–400)
RBC: 4.92 10*6/uL (ref 3.70–5.45)
RDW: 21 % — AB (ref 11.2–14.5)
WBC: 9.1 10*3/uL (ref 3.9–10.3)
lymph#: 1.2 10*3/uL (ref 0.9–3.3)

## 2014-05-19 LAB — COMPREHENSIVE METABOLIC PANEL (CC13)
ALT: 28 U/L (ref 0–55)
ANION GAP: 9 meq/L (ref 3–11)
AST: 23 U/L (ref 5–34)
Albumin: 3.2 g/dL — ABNORMAL LOW (ref 3.5–5.0)
Alkaline Phosphatase: 62 U/L (ref 40–150)
BILIRUBIN TOTAL: 0.5 mg/dL (ref 0.20–1.20)
BUN: 9.9 mg/dL (ref 7.0–26.0)
CHLORIDE: 104 meq/L (ref 98–109)
CO2: 26 mEq/L (ref 22–29)
CREATININE: 0.8 mg/dL (ref 0.6–1.1)
Calcium: 9.2 mg/dL (ref 8.4–10.4)
EGFR: 77 mL/min/{1.73_m2} — ABNORMAL LOW (ref >90)
GLUCOSE: 128 mg/dL (ref 70–140)
Potassium: 3.9 mEq/L (ref 3.5–5.1)
Sodium: 138 mEq/L (ref 136–145)
Total Protein: 6.3 g/dL — ABNORMAL LOW (ref 6.4–8.3)

## 2014-05-19 MED ORDER — PALONOSETRON HCL INJECTION 0.25 MG/5ML
0.2500 mg | Freq: Once | INTRAVENOUS | Status: AC
  Administered 2014-05-19: 0.25 mg via INTRAVENOUS

## 2014-05-19 MED ORDER — SODIUM CHLORIDE 0.9 % IV SOLN
500.0000 mg/m2 | Freq: Once | INTRAVENOUS | Status: AC
  Administered 2014-05-19: 1200 mg via INTRAVENOUS
  Filled 2014-05-19: qty 60

## 2014-05-19 MED ORDER — SODIUM CHLORIDE 0.9 % IJ SOLN
10.0000 mL | INTRAMUSCULAR | Status: DC | PRN
  Administered 2014-05-19: 10 mL
  Filled 2014-05-19: qty 10

## 2014-05-19 MED ORDER — PALONOSETRON HCL INJECTION 0.25 MG/5ML
INTRAVENOUS | Status: AC
  Filled 2014-05-19: qty 5

## 2014-05-19 MED ORDER — DOXORUBICIN HCL CHEMO IV INJECTION 2 MG/ML
50.0000 mg/m2 | Freq: Once | INTRAVENOUS | Status: AC
  Administered 2014-05-19: 120 mg via INTRAVENOUS
  Filled 2014-05-19: qty 60

## 2014-05-19 MED ORDER — HEPARIN SOD (PORK) LOCK FLUSH 100 UNIT/ML IV SOLN
500.0000 [IU] | Freq: Once | INTRAVENOUS | Status: AC | PRN
  Administered 2014-05-19: 500 [IU]
  Filled 2014-05-19: qty 5

## 2014-05-19 MED ORDER — DEXAMETHASONE SODIUM PHOSPHATE 10 MG/ML IJ SOLN
INTRAMUSCULAR | Status: AC
  Filled 2014-05-19: qty 1

## 2014-05-19 MED ORDER — DEXAMETHASONE SODIUM PHOSPHATE 10 MG/ML IJ SOLN
10.0000 mg | Freq: Once | INTRAMUSCULAR | Status: AC
  Administered 2014-05-19: 10 mg via INTRAVENOUS

## 2014-05-19 MED ORDER — SODIUM CHLORIDE 0.9 % IV SOLN
Freq: Once | INTRAVENOUS | Status: AC
  Administered 2014-05-19: 16:00:00 via INTRAVENOUS

## 2014-05-19 MED ORDER — SODIUM CHLORIDE 0.9 % IV SOLN
150.0000 mg | Freq: Once | INTRAVENOUS | Status: AC
  Administered 2014-05-19: 150 mg via INTRAVENOUS
  Filled 2014-05-19: qty 5

## 2014-05-19 NOTE — Progress Notes (Signed)
Patient Care Team: Debbrah Alar, NP as PCP - General (Internal Medicine) Excell Seltzer, MD as Consulting Physician (General Surgery) Rulon Eisenmenger, MD as Consulting Physician (Hematology and Oncology) [name], MD as Attending Physician (Radiation Oncology)  DIAGNOSIS: Breast cancer of upper-outer quadrant of left female breast   Staging form: Breast, AJCC 7th Edition     Clinical: Stage IIB (T3, N0, cM0) - Unsigned       Staging comments: Staged at breast conference 12/31/13.      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 She is here today for cycle 4 of Adriamycin Cytoxan. Her mother-in-law died recently and she had to fly to [place]. It reports a TSA personnel examine her armpit and since then she has been very sore under the left axilla.  REVIEW OF SYSTEMS:   Constitutional: Denies  fevers, chills or abnormal weight loss Eyes: Denies blurriness of vision Ears, nose, mouth, throat, and face: Denies mucositis or sore throat Respiratory: Denies cough, dyspnea or wheezes Cardiovascular: Denies palpitation, chest discomfort or lower extremity swelling Gastrointestinal:  Denies nausea, heartburn or change in bowel habits Skin: Denies abnormal skin rashes Lymphatics: Denies new lymphadenopathy or easy bruising Neurological:Denies numbness, tingling or new weaknesses Behavioral/Psych: Mood is stable, no new changes  Breast: Pain left axilla All other systems were reviewed with the patient and are negative.  I have reviewed the past medical history, past surgical history, social history and family history with the patient and they are unchanged from previous note.  ALLERGIES:  is allergic to bee venom; contrast media; iodine; latex; penicillins; shellfish allergy; aspirin; erythromycin; lidocaine; symbicort; codeine; compazine; pentazocine; pentazocine lactate; sulfamethoxazole; and sulfonamide derivatives.  MEDICATIONS:  Current Outpatient Prescriptions  Medication Sig Dispense Refill  . albuterol (PROVENTIL HFA;VENTOLIN HFA) 108 (90 BASE) MCG/ACT inhaler Inhale 2 puffs into the lungs every 6 (six) hours as needed for wheezing or shortness of breath. Please dispense Proair 8.5 g 5  . albuterol (PROVENTIL) (5 MG/ML) 0.5% nebulizer solution Take 2.5 mg by nebulization every 6 (six) hours as needed for wheezing or shortness of breath.    . benzonatate (TESSALON) 200 MG capsule Take 1 capsule (200 mg total) by mouth 3 (three) times daily as needed for cough. 30 capsule 1  . Calcium Carbonate (CALCIUM 500 PO) Take 1 tablet by mouth 2 (two) times daily.      . cephALEXin (KEFLEX) 500 MG capsule Take 500 mg  by mouth 4 (four) times daily.    Marland Kitchen dexamethasone (DECADRON) 4 MG tablet Take 2 tablets by mouth once a day on the day after chemotherapy and then take 2 tablets two times a day  for 2 days. Take with food. 30 tablet 1  . diphenhydrAMINE (BENADRYL) 25 MG tablet Take 1 tablet an hour before MRI.  Then take 1 tablet at bedtime as needed, per your usual regimen. 30 tablet 0  . DULoxetine (CYMBALTA) 60 MG capsule Take 1 capsule (60 mg total) by mouth daily. 90 capsule 1  . EPINEPHrine 0.3 mg/0.3 mL IJ SOAJ injection Inject 0.3 mLs (0.3 mg total) into the muscle once. 2 Device 0  . Ergocalciferol (VITAMIN D2) 400 UNITS TABS Take 1 tablet by mouth daily.    . Fluticasone-Salmeterol (ADVAIR DISKUS) 250-50 MCG/DOSE AEPB Inhale 1 puff into the lungs 2 (two) times daily. 180 each 1  . levothyroxine (SYNTHROID, LEVOTHROID) 150 MCG tablet Take 1 tablet (150 mcg total) by mouth daily. 30 tablet 1  . LORazepam (ATIVAN) 0.5 MG tablet Take 1 tablet (0.5 mg total) by mouth every 6 (six) hours as needed (Nausea or vomiting). 30 tablet 0  . meloxicam (MOBIC) 7.5 MG tablet TAKE 1 TABLET DAILY 90 tablet 0  . methylPREDNISolone (MEDROL) 4 MG tablet     . Multiple Vitamin (MULTIVITAMIN) tablet Take 1 tablet by mouth daily.      . nitroGLYCERIN (NITROSTAT) 0.4 MG SL tablet Place 1 tablet (0.4 mg total) under the tongue every 5 (five) minutes as needed for chest pain. 30 tablet 0  . Omega-3 Fatty Acids (FISH OIL) 1200 MG CAPS Take 1 capsule by mouth daily.      Marland Kitchen omeprazole (PRILOSEC) 40 MG capsule Take 1 capsule (40 mg total) by mouth daily. 90 capsule 1  . ondansetron (ZOFRAN) 8 MG tablet Take 1 tablet (8 mg total) by mouth 2 (two) times daily as needed. Start on the third day after chemotherapy. 30 tablet 1  . oxybutynin (DITROPAN-XL) 5 MG 24 hr tablet Take 5 mg by mouth daily.    Marland Kitchen oxyCODONE-acetaminophen (ROXICET) 5-325 MG per tablet 1-2 tablets q 4h prn 20 tablet 0  . prochlorperazine (COMPAZINE) 10 MG tablet   1  . simvastatin (ZOCOR) 40 MG tablet TAKE 1 TABLET DAILY 90 tablet 0  . traMADol (ULTRAM) 50 MG tablet      No current facility-administered medications for this visit.     PHYSICAL EXAMINATION: ECOG PERFORMANCE STATUS: 1 - Symptomatic but completely ambulatory  Filed Vitals:   05/19/14 1432  BP: 131/82  Pulse: 99  Temp: 98.1 F (36.7 C)  Resp: 18   Filed Weights   05/19/14 1432  Weight: 261 lb 9.6 oz (118.661 kg)    GENERAL:alert, no distress and comfortable SKIN: skin color, texture, turgor are normal, no rashes or significant lesions EYES: normal, Conjunctiva are pink and non-injected, sclera clear OROPHARYNX:no exudate, no erythema and lips, buccal mucosa, and tongue normal  NECK: supple, thyroid normal size, non-tender, without nodularity LYMPH:  no palpable lymphadenopathy in the cervical, axillary or inguinal LUNGS: clear to auscultation and percussion with normal breathing effort HEART: regular rate & rhythm and no murmurs and no lower extremity edema ABDOMEN:abdomen soft, non-tender and normal bowel sounds Musculoskeletal:no cyanosis of digits and no clubbing  NEURO: alert & oriented x 3 with fluent speech, no focal motor/sensory deficits BREAST: Palpable nodularity at the site of the surgery and some inflammation and swelling in the left axilla LABORATORY  DATA:  I have reviewed the data as listed   Chemistry      Component Value Date/Time   NA 138 05/19/2014 1418   NA 140 05/31/2011 0905   K 3.9 05/19/2014 1418   K 4.6 05/31/2011 0905   CL 106 05/31/2011 0905   CO2 26 05/19/2014 1418   CO2 24 05/31/2011 0905   BUN 9.9 05/19/2014 1418   BUN 17 05/31/2011 0905   CREATININE 0.8 05/19/2014 1418   CREATININE 0.83 05/31/2011 0905   CREATININE 0.73 12/21/2009 1937      Component Value Date/Time   CALCIUM 9.2 05/19/2014 1418   CALCIUM 9.4 05/31/2011 0905   ALKPHOS 62 05/19/2014 1418   ALKPHOS 52 08/11/2013 0918   AST 23 05/19/2014 1418   AST 19 08/11/2013 0918   ALT 28 05/19/2014 1418   ALT 24 08/11/2013 0918   BILITOT 0.50 05/19/2014 1418   BILITOT 0.4 08/11/2013 0918       Lab Results  Component Value Date   WBC  9.1 05/19/2014   HGB 12.0 05/19/2014   HCT 38.1 05/19/2014   MCV 77.5* 05/19/2014   PLT 339 05/19/2014   NEUTROABS 6.1 05/19/2014    ASSESSMENT & PLAN:  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 M0 stage IIB  Today is cycle 4 day 1 of chemotherapy with dose dense Adriamycin Cytoxan.  In 2 weeks she will start with 1 of Taxol  Patient experienced the following toxicities to chemotherapy 1. Chemotherapy-induced neutropenia: recovered by day 1 of chemotherapy 2. Chemotherapy-induced diarrhea resolved with Imodium 3. Esophageal spasms diminished  4. Fatigue related to chemotherapy 5. Alopecia  Breast soreness: After recent airplane journey when the TSA personnel felt under the arm is very sore from it. On exam today did not reveal major abnormalities other than mild inflammation and palpable nodularity where she had the surgery. Return to clinic in 2 weeks for Taxol    No orders of the defined types were placed in this encounter.   The patient has a good understanding of the overall plan. she agrees with it. She will call with any problems that may develop before her next visit here.   Rulon Eisenmenger, MD 05/19/2014 3:09 PM

## 2014-05-19 NOTE — Patient Instructions (Signed)
[facility] Discharge Instructions for Patients Receiving Chemotherapy  Today you received the following chemotherapy agents Adria and Cytoxan  To help prevent nausea and vomiting after your treatment, we encourage you to take your nausea medication as prescribed   If you develop nausea and vomiting that is not controlled by your nausea medication, call the clinic.   BELOW ARE SYMPTOMS THAT SHOULD BE REPORTED IMMEDIATELY:  *FEVER GREATER THAN 100.5 F  *CHILLS WITH OR WITHOUT FEVER  NAUSEA AND VOMITING THAT IS NOT CONTROLLED WITH YOUR NAUSEA MEDICATION  *UNUSUAL SHORTNESS OF BREATH  *UNUSUAL BRUISING OR BLEEDING  TENDERNESS IN MOUTH AND THROAT WITH OR WITHOUT PRESENCE OF ULCERS  *URINARY PROBLEMS  *BOWEL PROBLEMS  UNUSUAL RASH Items with * indicate a potential emergency and should be followed up as soon as possible.  Feel free to call the clinic you have any questions or concerns. The clinic phone number is [phone].    

## 2014-05-19 NOTE — Assessment & Plan Note (Addendum)
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 M0 stage IIB  Today is cycle 4 day 1 of chemotherapy with dose dense Adriamycin Cytoxan.  In 2 weeks she will start with 1 of Taxol  Patient experienced the following toxicities to chemotherapy 1. Chemotherapy-induced neutropenia: recovered by day 1 of chemotherapy 2. Chemotherapy-induced diarrhea resolved with Imodium 3. Esophageal spasms diminished  4. Fatigue related to chemotherapy 5. Alopecia  Breast soreness: After recent airplane journey when the TSA personnel felt under the arm is very sore from it. On exam today did not reveal major abnormalities other than mild inflammation and palpable nodularity where she had the surgery. Return to clinic in 2 weeks for Taxol

## 2014-05-20 ENCOUNTER — Ambulatory Visit (HOSPITAL_BASED_OUTPATIENT_CLINIC_OR_DEPARTMENT_OTHER): Payer: BC Managed Care – PPO

## 2014-05-20 ENCOUNTER — Other Ambulatory Visit: Payer: Self-pay

## 2014-05-20 DIAGNOSIS — C50412 Malignant neoplasm of upper-outer quadrant of left female breast: Secondary | ICD-10-CM

## 2014-05-20 DIAGNOSIS — Z5189 Encounter for other specified aftercare: Secondary | ICD-10-CM

## 2014-05-20 MED ORDER — PEGFILGRASTIM INJECTION 6 MG/0.6ML ~~LOC~~
6.0000 mg | PREFILLED_SYRINGE | Freq: Once | SUBCUTANEOUS | Status: AC
  Administered 2014-05-20: 6 mg via SUBCUTANEOUS
  Filled 2014-05-20: qty 0.6

## 2014-05-20 NOTE — Patient Instructions (Signed)
Pegfilgrastim injection What is this medicine? PEGFILGRASTIM (peg fil GRA stim) is a long-acting granulocyte colony-stimulating factor that stimulates the growth of neutrophils, a type of white blood cell important in the body's fight against infection. It is used to reduce the incidence of fever and infection in patients with certain types of cancer who are receiving chemotherapy that affects the bone marrow. This medicine may be used for other purposes; ask your health care provider or pharmacist if you have questions. COMMON BRAND NAME(S): Neulasta What should I tell my health care provider before I take this medicine? They need to know if you have any of these conditions: -latex allergy -ongoing radiation therapy -sickle cell disease -skin reactions to acrylic adhesives (On-Body Injector only) -an unusual or allergic reaction to pegfilgrastim, filgrastim, other medicines, foods, dyes, or preservatives -pregnant or trying to get pregnant -breast-feeding How should I use this medicine? This medicine is for injection under the skin. If you get this medicine at home, you will be taught how to prepare and give the pre-filled syringe or how to use the On-body Injector. Refer to the patient Instructions for Use for detailed instructions. Use exactly as directed. Take your medicine at regular intervals. Do not take your medicine more often than directed. It is important that you put your used needles and syringes in a special sharps container. Do not put them in a trash can. If you do not have a sharps container, call your pharmacist or healthcare provider to get one. Talk to your pediatrician regarding the use of this medicine in children. Special care may be needed. Overdosage: If you think you have taken too much of this medicine contact a poison control center or emergency room at once. NOTE: This medicine is only for you. Do not share this medicine with others. What if I miss a dose? It is  important not to miss your dose. Call your doctor or health care professional if you miss your dose. If you miss a dose due to an On-body Injector failure or leakage, a new dose should be administered as soon as possible using a single prefilled syringe for manual use. What may interact with this medicine? Interactions have not been studied. Give your health care provider a list of all the medicines, herbs, non-prescription drugs, or dietary supplements you use. Also tell them if you smoke, drink alcohol, or use illegal drugs. Some items may interact with your medicine. This list may not describe all possible interactions. Give your health care provider a list of all the medicines, herbs, non-prescription drugs, or dietary supplements you use. Also tell them if you smoke, drink alcohol, or use illegal drugs. Some items may interact with your medicine. What should I watch for while using this medicine? You may need blood work done while you are taking this medicine. If you are going to need a MRI, CT scan, or other procedure, tell your doctor that you are using this medicine (On-Body Injector only). What side effects may I notice from receiving this medicine? Side effects that you should report to your doctor or health care professional as soon as possible: -allergic reactions like skin rash, itching or hives, swelling of the face, lips, or tongue -dizziness -fever -pain, redness, or irritation at site where injected -pinpoint red spots on the skin -shortness of breath or breathing problems -stomach or side pain, or pain at the shoulder -swelling -tiredness -trouble passing urine Side effects that usually do not require medical attention (report to your doctor   or health care professional if they continue or are bothersome): -bone pain -muscle pain This list may not describe all possible side effects. Call your doctor for medical advice about side effects. You may report side effects to FDA at  1-800-FDA-1088. Where should I keep my medicine? Keep out of the reach of children. Store pre-filled syringes in a refrigerator between 2 and 8 degrees C (36 and 46 degrees F). Do not freeze. Keep in carton to protect from light. Throw away this medicine if it is left out of the refrigerator for more than 48 hours. Throw away any unused medicine after the expiration date. NOTE: This sheet is a summary. It may not cover all possible information. If you have questions about this medicine, talk to your doctor, pharmacist, or health care provider.  2015, [employer] Standard. (2013-08-07 16:14:05)  

## 2014-05-23 ENCOUNTER — Other Ambulatory Visit: Payer: Self-pay | Admitting: Family

## 2014-05-25 ENCOUNTER — Telehealth: Payer: Self-pay | Admitting: *Deleted

## 2014-05-25 NOTE — Telephone Encounter (Signed)
Per staff message and POF I have scheduled appts. Advised scheduler of appts. JMW  

## 2014-05-26 ENCOUNTER — Telehealth: Payer: Self-pay | Admitting: Hematology and Oncology

## 2014-05-26 NOTE — Telephone Encounter (Signed)
, °

## 2014-06-02 ENCOUNTER — Ambulatory Visit (HOSPITAL_BASED_OUTPATIENT_CLINIC_OR_DEPARTMENT_OTHER): Payer: BLUE CROSS/BLUE SHIELD | Admitting: Nurse Practitioner

## 2014-06-02 ENCOUNTER — Other Ambulatory Visit: Payer: Self-pay | Admitting: Hematology and Oncology

## 2014-06-02 ENCOUNTER — Encounter: Payer: Self-pay | Admitting: Nurse Practitioner

## 2014-06-02 ENCOUNTER — Ambulatory Visit (HOSPITAL_BASED_OUTPATIENT_CLINIC_OR_DEPARTMENT_OTHER): Payer: BLUE CROSS/BLUE SHIELD

## 2014-06-02 ENCOUNTER — Other Ambulatory Visit (HOSPITAL_BASED_OUTPATIENT_CLINIC_OR_DEPARTMENT_OTHER): Payer: BLUE CROSS/BLUE SHIELD

## 2014-06-02 VITALS — BP 145/87 | HR 81 | Temp 98.0°F | Resp 18 | Wt 261.2 lb

## 2014-06-02 DIAGNOSIS — K1370 Unspecified lesions of oral mucosa: Secondary | ICD-10-CM

## 2014-06-02 DIAGNOSIS — C50412 Malignant neoplasm of upper-outer quadrant of left female breast: Secondary | ICD-10-CM

## 2014-06-02 DIAGNOSIS — Z5111 Encounter for antineoplastic chemotherapy: Secondary | ICD-10-CM

## 2014-06-02 DIAGNOSIS — K1231 Oral mucositis (ulcerative) due to antineoplastic therapy: Secondary | ICD-10-CM

## 2014-06-02 LAB — CBC WITH DIFFERENTIAL/PLATELET
BASO%: 1.1 % (ref 0.0–2.0)
BASOS ABS: 0 10*3/uL (ref 0.0–0.1)
EOS ABS: 0.2 10*3/uL (ref 0.0–0.5)
EOS%: 5.1 % (ref 0.0–7.0)
HEMATOCRIT: 36.4 % (ref 34.8–46.6)
HEMOGLOBIN: 11.7 g/dL (ref 11.6–15.9)
LYMPH%: 17.1 % (ref 14.0–49.7)
MCH: 25.3 pg (ref 25.1–34.0)
MCHC: 32.1 g/dL (ref 31.5–36.0)
MCV: 78.6 fL — ABNORMAL LOW (ref 79.5–101.0)
MONO#: 0.6 10*3/uL (ref 0.1–0.9)
MONO%: 14.9 % — ABNORMAL HIGH (ref 0.0–14.0)
NEUT#: 2.3 10*3/uL (ref 1.5–6.5)
NEUT%: 61.8 % (ref 38.4–76.8)
Platelets: 206 10*3/uL (ref 145–400)
RBC: 4.63 10*6/uL (ref 3.70–5.45)
RDW: 20.4 % — ABNORMAL HIGH (ref 11.2–14.5)
WBC: 3.8 10*3/uL — AB (ref 3.9–10.3)
lymph#: 0.6 10*3/uL — ABNORMAL LOW (ref 0.9–3.3)

## 2014-06-02 LAB — COMPREHENSIVE METABOLIC PANEL (CC13)
ALT: 20 U/L (ref 0–55)
AST: 14 U/L (ref 5–34)
Albumin: 3.4 g/dL — ABNORMAL LOW (ref 3.5–5.0)
Alkaline Phosphatase: 71 U/L (ref 40–150)
Anion Gap: 7 mEq/L (ref 3–11)
BUN: 9.7 mg/dL (ref 7.0–26.0)
CO2: 30 mEq/L — ABNORMAL HIGH (ref 22–29)
Calcium: 9.1 mg/dL (ref 8.4–10.4)
Chloride: 106 mEq/L (ref 98–109)
Creatinine: 0.8 mg/dL (ref 0.6–1.1)
EGFR: 79 mL/min/{1.73_m2} — ABNORMAL LOW (ref >90)
Glucose: 125 mg/dl (ref 70–140)
Potassium: 3.8 mEq/L (ref 3.5–5.1)
SODIUM: 142 meq/L (ref 136–145)
TOTAL PROTEIN: 6.3 g/dL — AB (ref 6.4–8.3)
Total Bilirubin: 0.2 mg/dL (ref 0.20–1.20)

## 2014-06-02 MED ORDER — ONDANSETRON HCL 8 MG PO TABS: 8.0000 mg | ORAL_TABLET | Freq: Two times a day (BID) | ORAL | Status: DC | PRN

## 2014-06-02 MED ORDER — SODIUM CHLORIDE 0.9 % IV SOLN
Freq: Once | INTRAVENOUS | Status: AC
  Administered 2014-06-02: 12:00:00 via INTRAVENOUS

## 2014-06-02 MED ORDER — HEPARIN SOD (PORK) LOCK FLUSH 100 UNIT/ML IV SOLN
500.0000 [IU] | Freq: Once | INTRAVENOUS | Status: AC | PRN
  Administered 2014-06-02: 500 [IU]
  Filled 2014-06-02: qty 5

## 2014-06-02 MED ORDER — ONDANSETRON 8 MG/50ML IVPB (CHCC)
8.0000 mg | Freq: Once | INTRAVENOUS | Status: AC
  Administered 2014-06-02: 8 mg via INTRAVENOUS

## 2014-06-02 MED ORDER — ONDANSETRON 8 MG/NS 50 ML IVPB
INTRAVENOUS | Status: AC
  Filled 2014-06-02: qty 8

## 2014-06-02 MED ORDER — NYSTATIN 100000 UNIT/ML MT SUSP
5.0000 mL | Freq: Four times a day (QID) | OROMUCOSAL | Status: DC
Start: 2014-06-02 — End: 2014-10-26

## 2014-06-02 MED ORDER — SODIUM CHLORIDE 0.9 % IJ SOLN
10.0000 mL | INTRAMUSCULAR | Status: DC | PRN
  Administered 2014-06-02: 10 mL
  Filled 2014-06-02: qty 10

## 2014-06-02 MED ORDER — PACLITAXEL PROTEIN-BOUND CHEMO INJECTION 100 MG
100.0000 mg/m2 | Freq: Once | INTRAVENOUS | Status: AC
  Administered 2014-06-02: 250 mg via INTRAVENOUS
  Filled 2014-06-02: qty 50

## 2014-06-02 NOTE — Patient Instructions (Signed)
[facility] Discharge Instructions for Patients Receiving Chemotherapy  Today you received the following chemotherapy agents:  Abraxane  To help prevent nausea and vomiting after your treatment, we encourage you to take your nausea medication as ordered per MD.   If you develop nausea and vomiting that is not controlled by your nausea medication, call the clinic.   BELOW ARE SYMPTOMS THAT SHOULD BE REPORTED IMMEDIATELY:  *FEVER GREATER THAN 100.5 F  *CHILLS WITH OR WITHOUT FEVER  NAUSEA AND VOMITING THAT IS NOT CONTROLLED WITH YOUR NAUSEA MEDICATION  *UNUSUAL SHORTNESS OF BREATH  *UNUSUAL BRUISING OR BLEEDING  TENDERNESS IN MOUTH AND THROAT WITH OR WITHOUT PRESENCE OF ULCERS  *URINARY PROBLEMS  *BOWEL PROBLEMS  UNUSUAL RASH Items with * indicate a potential emergency and should be followed up as soon as possible.  Feel free to call the clinic you have any questions or concerns. The clinic phone number is [phone].    

## 2014-06-02 NOTE — Assessment & Plan Note (Signed)
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 M0 stage IIB  The labs were reviewed in detail and were entirely stable. Michelle Kennedy will proceed with cycle 1 of abraxane today. We spent some time discussing her antiemetic regimen. As she experienced minimal nausea with AC, she will stop the dexamethasone at this time and try to use zofran alone for her nausea as she is allergic to compazine.   For her mouth sores, I have sent in a prescription for nystatin suspension to swish and spit QID PRN.   [facility] will return next week for labs, follow up visit, and cycle 2 of abraxane.  

## 2014-06-02 NOTE — Progress Notes (Signed)
Patient Care Team: Debbrah Alar, NP as PCP - General (Internal Medicine) Excell Seltzer, MD as Consulting Physician (General Surgery) Rulon Eisenmenger, MD as Consulting Physician (Hematology and Oncology) [name], MD as Attending Physician (Radiation Oncology)  DIAGNOSIS: Breast cancer of upper-outer quadrant of left female breast   Staging form: Breast, AJCC 7th Edition     Clinical: Stage IIB (T3, N0, cM0) - Unsigned       Staging comments: Staged at breast conference 12/31/13.      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 She is here to begin abraxane weekly, with cycle 1 beginning today. She tolerated AC well without incident.   REVIEW OF SYSTEMS:   Michelle Kennedy denies fevers or chills. She had mild nausea managed well with her PRN antiemetics. She is moving her bowel well, and eats lots  of fruit to avoid constipation. Her appetite is fair and she tried to stay hydrated, but her mouth is very dry. She is using biotene mouthwash, but this is not effective. She has some mouth sore to appear in her buccal mucosa that come and go. Her energy level is up and down, but she sleeps well. She denies shortness of breath, chest pain, cough, or palpitations. She has no neuropathy symptoms at baseline.  All other systems were reviewed with the patient and are negative.  I have reviewed the past medical history, past surgical history, social history and family history with the patient and they are unchanged from previous note.  ALLERGIES:  is allergic to bee venom; contrast media; iodine; latex; penicillins; shellfish allergy; aspirin; erythromycin; lidocaine; symbicort; codeine; compazine; pentazocine; pentazocine lactate; sulfamethoxazole; and sulfonamide derivatives.  MEDICATIONS:  Current Outpatient Prescriptions  Medication Sig Dispense Refill  . Calcium Carbonate (CALCIUM 500 PO) Take 1 tablet by mouth daily.     Marland Kitchen dexamethasone (DECADRON) 4 MG tablet Take 2 tablets by mouth once a day on the day after chemotherapy and then take 2 tablets two times a day for 2 days. Take with food. 30 tablet 1  . DULoxetine (CYMBALTA) 60 MG capsule TAKE 1 CAPSULE DAILY 90 capsule 0  . Ergocalciferol (VITAMIN D2) 400 UNITS TABS Take 1 tablet by mouth daily.    . Fluticasone-Salmeterol (ADVAIR DISKUS) 250-50 MCG/DOSE AEPB Inhale 1 puff into the lungs 2 (two) times daily. 180 each 1  . levothyroxine (SYNTHROID, LEVOTHROID) 150 MCG tablet Take 1 tablet (150 mcg total) by mouth  daily. 30 tablet 1  . LORazepam (ATIVAN) 0.5 MG tablet Take 1 tablet (0.5 mg total) by mouth every 6 (six) hours as needed (Nausea or vomiting). 30 tablet 0  . meloxicam (MOBIC) 7.5 MG tablet TAKE 1 TABLET DAILY 90 tablet 0  . methylPREDNISolone (MEDROL) 4 MG tablet     . Multiple Vitamin (MULTIVITAMIN) tablet Take 1 tablet by mouth  daily.      . Omega-3 Fatty Acids (FISH OIL) 1200 MG CAPS Take 1 capsule by mouth daily.      Marland Kitchen omeprazole (PRILOSEC) 40 MG capsule Take 1 capsule (40 mg total) by mouth daily. 90 capsule 1  . ondansetron (ZOFRAN) 8 MG tablet Take 1 tablet (8 mg total) by mouth 2 (two) times daily as needed. Start on the third day after chemotherapy. 30 tablet 1  . oxybutynin (DITROPAN-XL) 5 MG 24 hr tablet Take 5 mg by mouth daily.    . simvastatin (ZOCOR) 40 MG tablet TAKE 1 TABLET DAILY 90 tablet 0  . albuterol (PROVENTIL HFA;VENTOLIN HFA) 108 (90 BASE) MCG/ACT inhaler Inhale 2 puffs into the lungs every 6 (six) hours as needed for wheezing or shortness of breath. Please dispense Proair (Patient not taking: Reported on 06/02/2014) 8.5 g 5  . albuterol (PROVENTIL) (5 MG/ML) 0.5% nebulizer solution Take 2.5 mg by nebulization every 6 (six) hours as needed for wheezing or shortness of breath.    . benzonatate (TESSALON) 200 MG capsule Take 1 capsule (200 mg total) by mouth 3 (three) times daily as needed for cough. (Patient not taking: Reported on 06/02/2014) 30 capsule 1  . diphenhydrAMINE (BENADRYL) 25 MG tablet Take 1 tablet an hour before MRI.  Then take 1 tablet at bedtime as needed, per your usual regimen. (Patient not taking: Reported on 06/02/2014) 30 tablet 0  . EPINEPHrine 0.3 mg/0.3 mL IJ SOAJ injection Inject 0.3 mLs (0.3 mg total) into the muscle once. (Patient not taking: Reported on 06/02/2014) 2 Device 0  . nitroGLYCERIN (NITROSTAT) 0.4 MG SL tablet Place 1 tablet (0.4 mg total) under the tongue every 5 (five) minutes as needed for chest pain. (Patient not taking: Reported on 06/02/2014) 30 tablet 0  . nystatin (MYCOSTATIN) 100000 UNIT/ML suspension Take 5 mLs (500,000 Units total) by mouth 4 (four) times daily. 240 mL 0  . oxyCODONE-acetaminophen (ROXICET) 5-325 MG per tablet 1-2 tablets q 4h prn (Patient not taking: Reported on 06/02/2014) 20 tablet 0  . prochlorperazine (COMPAZINE) 10 MG tablet   1  .  traMADol (ULTRAM) 50 MG tablet      No current facility-administered medications for this visit.    PHYSICAL EXAMINATION: ECOG PERFORMANCE STATUS: 1 - Symptomatic but completely ambulatory  Filed Vitals:   06/02/14 1050  BP: 145/87  Pulse: 81  Temp: 98 F (36.7 C)  Resp: 18   Filed Weights   06/02/14 1050  Weight: 261 lb 3.9 oz (118.5 kg)    Skin: warm, dry  HEENT: sclerae anicteric, conjunctivae pink, grade 1 mucositis Lymph Nodes: No cervical or supraclavicular lymphadenopathy  Lungs: clear to auscultation bilaterally, no rales, wheezes, or rhonci  Heart: regular rate and rhythm  Abdomen: round, soft, non tender, positive bowel sounds  Musculoskeletal: No focal spinal tenderness, no peripheral edema  Neuro: non focal, well oriented, positive affect  Breasts: deferred  LABORATORY DATA:  I have reviewed the data as listed   Chemistry      Component Value Date/Time   NA 142 06/02/2014 1025   NA 140 05/31/2011  0905   K 3.8 06/02/2014 1025   K 4.6 05/31/2011 0905   CL 106 05/31/2011 0905   CO2 30* 06/02/2014 1025   CO2 24 05/31/2011 0905   BUN 9.7 06/02/2014 1025   BUN 17 05/31/2011 0905   CREATININE 0.8 06/02/2014 1025   CREATININE 0.83 05/31/2011 0905   CREATININE 0.73 12/21/2009 1937      Component Value Date/Time   CALCIUM 9.1 06/02/2014 1025   CALCIUM 9.4 05/31/2011 0905   ALKPHOS 71 06/02/2014 1025   ALKPHOS 52 08/11/2013 0918   AST 14 06/02/2014 1025   AST 19 08/11/2013 0918   ALT 20 06/02/2014 1025   ALT 24 08/11/2013 0918   BILITOT 0.20 06/02/2014 1025   BILITOT 0.4 08/11/2013 0918       Lab Results  Component Value Date   WBC 3.8* 06/02/2014   HGB 11.7 06/02/2014   HCT 36.4 06/02/2014   MCV 78.6* 06/02/2014   PLT 206 06/02/2014   NEUTROABS 2.3 06/02/2014    ASSESSMENT & PLAN:  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 M0 stage IIB  The labs were reviewed in detail and were entirely stable. Michelle Kennedy will proceed with cycle 1 of abraxane today. We spent some time discussing her antiemetic regimen. As she experienced minimal nausea with AC, she will stop the dexamethasone at this time and try to use zofran alone for her nausea as she is allergic to compazine.   For her mouth sores, I have sent in a prescription for nystatin suspension to swish and spit QID PRN.   Michelle Kennedy will return next week for labs, follow up visit, and cycle 2 of abraxane.    No orders of the defined types were placed in this encounter.   The patient has a good understanding of the overall plan. she agrees with it. She will call with any problems that may develop before her next visit here.   Marcelino Duster, NP 06/02/2014 11:13 AM

## 2014-06-02 NOTE — Addendum Note (Signed)
Addended by: Marcelino Duster on: 06/02/2014 11:43 AM   Modules accepted: Orders

## 2014-06-03 ENCOUNTER — Telehealth: Payer: Self-pay

## 2014-06-03 NOTE — Telephone Encounter (Signed)
-----   Message from Rosalie Gums, RN sent at 06/02/2014 12:12 PM EST ----- Regarding: New Treatment Follow up call for first time Abraxane. Pt is a pt of Dr.Guedena.

## 2014-06-03 NOTE — Telephone Encounter (Signed)
lvm re chemo f/u, making sure she can eat and drink, able to void and bowel move. Please call with any questions and concerns.

## 2014-06-04 ENCOUNTER — Telehealth: Payer: Self-pay | Admitting: *Deleted

## 2014-06-04 NOTE — Telephone Encounter (Signed)
Patient called to report a rash across both breasts, did have some blisters that burst with clear fluid. Patient is using hydrocortisone cream and states that rash has improved since it started yesterday. Advised patient that she can take Benadryl every 6 hours as needed and to call if rash does not improve. Patient verbalized understanding.

## 2014-06-09 ENCOUNTER — Ambulatory Visit (HOSPITAL_BASED_OUTPATIENT_CLINIC_OR_DEPARTMENT_OTHER): Payer: BLUE CROSS/BLUE SHIELD | Admitting: Hematology and Oncology

## 2014-06-09 ENCOUNTER — Ambulatory Visit (HOSPITAL_BASED_OUTPATIENT_CLINIC_OR_DEPARTMENT_OTHER): Payer: BLUE CROSS/BLUE SHIELD

## 2014-06-09 ENCOUNTER — Other Ambulatory Visit (HOSPITAL_BASED_OUTPATIENT_CLINIC_OR_DEPARTMENT_OTHER): Payer: BLUE CROSS/BLUE SHIELD

## 2014-06-09 VITALS — BP 148/88 | HR 91 | Temp 97.7°F | Resp 19 | Ht 68.0 in | Wt 259.1 lb

## 2014-06-09 DIAGNOSIS — L659 Nonscarring hair loss, unspecified: Secondary | ICD-10-CM

## 2014-06-09 DIAGNOSIS — R5383 Other fatigue: Secondary | ICD-10-CM

## 2014-06-09 DIAGNOSIS — C50412 Malignant neoplasm of upper-outer quadrant of left female breast: Secondary | ICD-10-CM

## 2014-06-09 DIAGNOSIS — D702 Other drug-induced agranulocytosis: Secondary | ICD-10-CM

## 2014-06-09 DIAGNOSIS — Z5111 Encounter for antineoplastic chemotherapy: Secondary | ICD-10-CM

## 2014-06-09 LAB — CBC WITH DIFFERENTIAL/PLATELET
BASO%: 1.1 % (ref 0.0–2.0)
BASOS ABS: 0 10*3/uL (ref 0.0–0.1)
EOS%: 2.2 % (ref 0.0–7.0)
Eosinophils Absolute: 0.1 10*3/uL (ref 0.0–0.5)
HEMATOCRIT: 37.8 % (ref 34.8–46.6)
HEMOGLOBIN: 11.8 g/dL (ref 11.6–15.9)
LYMPH%: 20.2 % (ref 14.0–49.7)
MCH: 24.5 pg — ABNORMAL LOW (ref 25.1–34.0)
MCHC: 31.3 g/dL — AB (ref 31.5–36.0)
MCV: 78.2 fL — ABNORMAL LOW (ref 79.5–101.0)
MONO#: 0.6 10*3/uL (ref 0.1–0.9)
MONO%: 14.7 % — ABNORMAL HIGH (ref 0.0–14.0)
NEUT%: 61.8 % (ref 38.4–76.8)
NEUTROS ABS: 2.7 10*3/uL (ref 1.5–6.5)
PLATELETS: 450 10*3/uL — AB (ref 145–400)
RBC: 4.84 10*6/uL (ref 3.70–5.45)
RDW: 21.7 % — AB (ref 11.2–14.5)
WBC: 4.3 10*3/uL (ref 3.9–10.3)
lymph#: 0.9 10*3/uL (ref 0.9–3.3)

## 2014-06-09 LAB — COMPREHENSIVE METABOLIC PANEL (CC13)
ALK PHOS: 69 U/L (ref 40–150)
ALT: 30 U/L (ref 0–55)
AST: 26 U/L (ref 5–34)
Albumin: 3.7 g/dL (ref 3.5–5.0)
Anion Gap: 9 mEq/L (ref 3–11)
BUN: 13.7 mg/dL (ref 7.0–26.0)
CHLORIDE: 105 meq/L (ref 98–109)
CO2: 27 meq/L (ref 22–29)
CREATININE: 0.9 mg/dL (ref 0.6–1.1)
Calcium: 9.8 mg/dL (ref 8.4–10.4)
EGFR: 70 mL/min/{1.73_m2} — AB (ref >90)
Glucose: 96 mg/dl (ref 70–140)
POTASSIUM: 4.1 meq/L (ref 3.5–5.1)
Sodium: 141 mEq/L (ref 136–145)
TOTAL PROTEIN: 6.5 g/dL (ref 6.4–8.3)
Total Bilirubin: 0.3 mg/dL (ref 0.20–1.20)

## 2014-06-09 MED ORDER — ONDANSETRON 8 MG/NS 50 ML IVPB
INTRAVENOUS | Status: AC
  Filled 2014-06-09: qty 8

## 2014-06-09 MED ORDER — HEPARIN SOD (PORK) LOCK FLUSH 100 UNIT/ML IV SOLN
500.0000 [IU] | Freq: Once | INTRAVENOUS | Status: AC | PRN
Start: 2014-06-09 — End: 2014-06-09
  Administered 2014-06-09: 500 [IU]
  Filled 2014-06-09: qty 5

## 2014-06-09 MED ORDER — SODIUM CHLORIDE 0.9 % IV SOLN
Freq: Once | INTRAVENOUS | Status: AC
  Administered 2014-06-09: 13:00:00 via INTRAVENOUS

## 2014-06-09 MED ORDER — PACLITAXEL PROTEIN-BOUND CHEMO INJECTION 100 MG
100.0000 mg/m2 | Freq: Once | INTRAVENOUS | Status: AC
  Administered 2014-06-09: 250 mg via INTRAVENOUS
  Filled 2014-06-09: qty 50

## 2014-06-09 MED ORDER — ONDANSETRON 8 MG/50ML IVPB (CHCC)
8.0000 mg | Freq: Once | INTRAVENOUS | Status: AC
  Administered 2014-06-09: 8 mg via INTRAVENOUS

## 2014-06-09 MED ORDER — SODIUM CHLORIDE 0.9 % IJ SOLN
10.0000 mL | INTRAMUSCULAR | Status: DC | PRN
  Administered 2014-06-09: 10 mL
  Filled 2014-06-09: qty 10

## 2014-06-09 NOTE — Patient Instructions (Signed)
[facility] Discharge Instructions for Patients Receiving Chemotherapy  Today you received the following chemotherapy agents Abraxane  To help prevent nausea and vomiting after your treatment, we encourage you to take your nausea medication as prescribed.   If you develop nausea and vomiting that is not controlled by your nausea medication, call the clinic.   BELOW ARE SYMPTOMS THAT SHOULD BE REPORTED IMMEDIATELY:  *FEVER GREATER THAN 100.5 F  *CHILLS WITH OR WITHOUT FEVER  NAUSEA AND VOMITING THAT IS NOT CONTROLLED WITH YOUR NAUSEA MEDICATION  *UNUSUAL SHORTNESS OF BREATH  *UNUSUAL BRUISING OR BLEEDING  TENDERNESS IN MOUTH AND THROAT WITH OR WITHOUT PRESENCE OF ULCERS  *URINARY PROBLEMS  *BOWEL PROBLEMS  UNUSUAL RASH Items with * indicate a potential emergency and should be followed up as soon as possible.  Feel free to call the clinic you have any questions or concerns. The clinic phone number is [phone].    

## 2014-06-09 NOTE — Progress Notes (Signed)
Patient Care Team: Debbrah Alar, NP as PCP - General (Internal Medicine) Excell Seltzer, MD as Consulting Physician (General Surgery) Rulon Eisenmenger, MD as Consulting Physician (Hematology and Oncology) [name], MD as Attending Physician (Radiation Oncology)  DIAGNOSIS: Breast cancer of upper-outer quadrant of left female breast   Staging form: Breast, AJCC 7th Edition     Clinical: Stage IIB (T3, N0, cM0) - Unsigned       Staging comments: Staged at breast conference 12/31/13.      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 She tolerated week 1 extremely well without any major problems. She had noticed a rash on the chest which resolved with Benadryl. She attributes the rash to a tape that was applied on the port area. Denies any nausea vomiting. Denies any neuropathy.  REVIEW OF  SYSTEMS:   Constitutional: Denies fevers, chills or abnormal weight loss Eyes: Denies blurriness of vision Ears, nose, mouth, throat, and face: Denies mucositis or sore throat Respiratory: Denies cough, dyspnea or wheezes Cardiovascular: Denies palpitation, chest discomfort or lower extremity swelling Gastrointestinal:  Denies nausea, heartburn or change in bowel habits Skin: Denies abnormal skin rashes Lymphatics: Denies new lymphadenopathy or easy bruising Neurological:Denies numbness, tingling or new weaknesses Behavioral/Psych: Mood is stable, no new changes  Breast:  denies any pain or lumps or nodules in either breasts All other systems were reviewed with the patient and are negative.  I have reviewed the past medical history, past surgical history, social history and family history with the patient and they are unchanged from previous note.  ALLERGIES:  is allergic to bee venom; contrast media; iodine; latex; penicillins; shellfish allergy; aspirin; erythromycin; lidocaine; symbicort; codeine; compazine; pentazocine; pentazocine lactate; sulfamethoxazole; and sulfonamide derivatives.  MEDICATIONS:  Current Outpatient Prescriptions  Medication Sig Dispense Refill  . albuterol (PROVENTIL HFA;VENTOLIN HFA) 108 (90 BASE) MCG/ACT inhaler Inhale 2 puffs into the lungs every 6 (six) hours as needed for wheezing or shortness of breath. Please dispense Proair 8.5 g 5  . albuterol (PROVENTIL) (5 MG/ML) 0.5% nebulizer solution Take 2.5 mg by nebulization every 6 (six) hours as needed for wheezing or shortness of breath.    . benzonatate (TESSALON) 200 MG capsule Take 1 capsule (200 mg total) by mouth 3 (three) times daily as needed for cough. 30 capsule 1  . Calcium Carbonate (CALCIUM 500 PO) Take 1 tablet by mouth daily.     Marland Kitchen dexamethasone (DECADRON)  4 MG tablet Take 2 tablets by mouth once a day on the day after chemotherapy and then take 2 tablets two times a day for 2 days. Take with food.  30 tablet 1  . diphenhydrAMINE (BENADRYL) 25 MG tablet Take 1 tablet an hour before MRI.  Then take 1 tablet at bedtime as needed, per your usual regimen. 30 tablet 0  . DULoxetine (CYMBALTA) 60 MG capsule TAKE 1 CAPSULE DAILY 90 capsule 0  . EPINEPHrine 0.3 mg/0.3 mL IJ SOAJ injection Inject 0.3 mLs (0.3 mg total) into the muscle once. 2 Device 0  . Ergocalciferol (VITAMIN D2) 400 UNITS TABS Take 1 tablet by mouth daily.    . Fluticasone-Salmeterol (ADVAIR DISKUS) 250-50 MCG/DOSE AEPB Inhale 1 puff into the lungs 2 (two) times daily. 180 each 1  . levothyroxine (SYNTHROID, LEVOTHROID) 150 MCG tablet Take 1 tablet (150 mcg total) by mouth daily. 30 tablet 1  . LORazepam (ATIVAN) 0.5 MG tablet Take 1 tablet (0.5 mg total) by mouth every 6 (six) hours as needed (Nausea or vomiting). 30 tablet 0  . meloxicam (MOBIC) 7.5 MG tablet TAKE 1 TABLET DAILY 90 tablet 0  . methylPREDNISolone (MEDROL) 4 MG tablet     . Multiple Vitamin (MULTIVITAMIN) tablet Take 1 tablet by mouth daily.      . nitroGLYCERIN (NITROSTAT) 0.4 MG SL tablet Place 1 tablet (0.4 mg total) under the tongue every 5 (five) minutes as needed for chest pain. 30 tablet 0  . nystatin (MYCOSTATIN) 100000 UNIT/ML suspension Take 5 mLs (500,000 Units total) by mouth 4 (four) times daily. 240 mL 0  . Omega-3 Fatty Acids (FISH OIL) 1200 MG CAPS Take 1 capsule by mouth daily.      Marland Kitchen omeprazole (PRILOSEC) 40 MG capsule Take 1 capsule (40 mg total) by mouth daily. 90 capsule 1  . ondansetron (ZOFRAN) 8 MG tablet Take 1 tablet (8 mg total) by mouth 2 (two) times daily as needed. Start on the third day after chemotherapy. 30 tablet 1  . oxybutynin (DITROPAN-XL) 5 MG 24 hr tablet Take 5 mg by mouth daily.    Marland Kitchen oxyCODONE-acetaminophen (ROXICET) 5-325 MG per tablet 1-2 tablets q 4h prn 20 tablet 0  . prochlorperazine (COMPAZINE) 10 MG tablet   1  . simvastatin (ZOCOR) 40 MG tablet TAKE 1 TABLET DAILY 90 tablet 0  . traMADol (ULTRAM) 50 MG tablet       No current facility-administered medications for this visit.    PHYSICAL EXAMINATION: ECOG PERFORMANCE STATUS: 1 - Symptomatic but completely ambulatory  Filed Vitals:   06/09/14 1050  BP: 148/88  Pulse: 91  Temp: 97.7 F (36.5 C)  Resp: 19   Filed Weights   06/09/14 1050  Weight: 259 lb 1.6 oz (117.527 kg)    GENERAL:alert, no distress and comfortable SKIN: skin color, texture, turgor are normal, no rashes or significant lesions EYES: normal, Conjunctiva are pink and non-injected, sclera clear OROPHARYNX:no exudate, no erythema and lips, buccal mucosa, and tongue normal  NECK: supple, thyroid normal size, non-tender, without nodularity LYMPH:  no palpable lymphadenopathy in the cervical, axillary or inguinal LUNGS: clear to auscultation and percussion with normal breathing effort HEART: regular rate & rhythm and no murmurs and no lower extremity edema ABDOMEN:abdomen soft, non-tender and normal bowel sounds Musculoskeletal:no cyanosis of digits and no clubbing  NEURO: alert & oriented x 3 with fluent speech, no focal motor/sensory deficits  LABORATORY DATA:  I have reviewed the data as listed   Chemistry  Component Value Date/Time   NA 141 06/09/2014 1039   NA 140 05/31/2011 0905   K 4.1 06/09/2014 1039   K 4.6 05/31/2011 0905   CL 106 05/31/2011 0905   CO2 27 06/09/2014 1039   CO2 24 05/31/2011 0905   BUN 13.7 06/09/2014 1039   BUN 17 05/31/2011 0905   CREATININE 0.9 06/09/2014 1039   CREATININE 0.83 05/31/2011 0905   CREATININE 0.73 12/21/2009 1937      Component Value Date/Time   CALCIUM 9.8 06/09/2014 1039   CALCIUM 9.4 05/31/2011 0905   ALKPHOS 69 06/09/2014 1039   ALKPHOS 52 08/11/2013 0918   AST 26 06/09/2014 1039   AST 19 08/11/2013 0918   ALT 30 06/09/2014 1039   ALT 24 08/11/2013 0918   BILITOT 0.30 06/09/2014 1039   BILITOT 0.4 08/11/2013 0918       Lab Results  Component Value Date   WBC 4.3 06/09/2014   HGB 11.8 06/09/2014    HCT 37.8 06/09/2014   MCV 78.2* 06/09/2014   PLT 450* 06/09/2014   NEUTROABS 2.7 06/09/2014   ASSESSMENT & PLAN:  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 M0 stage IIB  Current treatment: Completed 4 cycles of dose dense Adriamycin and Cytoxan. Today is week 2/12 Abraxane  Toxicities of chemotherapy: 1. Chemotherapy-induced neutropenia: recovered by day 1 of chemotherapy 2. Chemotherapy-induced diarrhea resolved with Imodium 3. Esophageal spasms diminished  4. Fatigue related to chemotherapy 5. Alopecia Monitoring closely for chemotherapy toxicities Blood counts were reviewed and are adequate for treatment Return to clinic in 2 weeks for clinic follow-up but weekly for chemotherapy with Taxol     Orders Placed This Encounter  Procedures  . CBC with Differential    Standing Status: Future     Number of Occurrences:      Standing Expiration Date: 06/09/2015  . Comprehensive metabolic panel (Cmet) - [facility]    Standing Status: Future     Number of Occurrences:      Standing Expiration Date: 06/09/2015   The patient has a good understanding of the overall plan. she agrees with it. She will call with any problems that may develop before her next visit here.   Rulon Eisenmenger, MD

## 2014-06-09 NOTE — Assessment & Plan Note (Signed)
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 M0 stage IIB  Current treatment: Completed 4 cycles of dose dense Adriamycin and Cytoxan. Today is week 2/12 Taxol  Toxicities of chemotherapy: 1. Chemotherapy-induced neutropenia: recovered by day 1 of chemotherapy 2. Chemotherapy-induced diarrhea resolved with Imodium 3. Esophageal spasms diminished  4. Fatigue related to chemotherapy 5. Alopecia Monitoring closely for chemotherapy toxicities Blood counts were reviewed and are adequate for treatment Return to clinic in 2 weeks for clinic follow-up but weekly for chemotherapy with Taxol

## 2014-06-10 ENCOUNTER — Telehealth: Payer: Self-pay | Admitting: *Deleted

## 2014-06-10 NOTE — Telephone Encounter (Signed)
Per staff message I have adjsuted appts

## 2014-06-16 ENCOUNTER — Ambulatory Visit (HOSPITAL_BASED_OUTPATIENT_CLINIC_OR_DEPARTMENT_OTHER): Payer: BLUE CROSS/BLUE SHIELD

## 2014-06-16 ENCOUNTER — Ambulatory Visit (HOSPITAL_BASED_OUTPATIENT_CLINIC_OR_DEPARTMENT_OTHER): Payer: BLUE CROSS/BLUE SHIELD | Admitting: Hematology and Oncology

## 2014-06-16 ENCOUNTER — Other Ambulatory Visit (HOSPITAL_BASED_OUTPATIENT_CLINIC_OR_DEPARTMENT_OTHER): Payer: BLUE CROSS/BLUE SHIELD

## 2014-06-16 VITALS — BP 153/95 | HR 85 | Temp 97.6°F | Resp 19 | Ht 68.0 in | Wt 262.0 lb

## 2014-06-16 DIAGNOSIS — C50412 Malignant neoplasm of upper-outer quadrant of left female breast: Secondary | ICD-10-CM

## 2014-06-16 DIAGNOSIS — Z5111 Encounter for antineoplastic chemotherapy: Secondary | ICD-10-CM

## 2014-06-16 DIAGNOSIS — D701 Agranulocytosis secondary to cancer chemotherapy: Secondary | ICD-10-CM

## 2014-06-16 DIAGNOSIS — Z170 Estrogen receptor positive status [ER+]: Secondary | ICD-10-CM

## 2014-06-16 DIAGNOSIS — Z452 Encounter for adjustment and management of vascular access device: Secondary | ICD-10-CM

## 2014-06-16 DIAGNOSIS — R5383 Other fatigue: Secondary | ICD-10-CM

## 2014-06-16 DIAGNOSIS — L659 Nonscarring hair loss, unspecified: Secondary | ICD-10-CM

## 2014-06-16 LAB — CBC WITH DIFFERENTIAL/PLATELET
BASO%: 1.2 % (ref 0.0–2.0)
Basophils Absolute: 0 10*3/uL (ref 0.0–0.1)
EOS%: 4.4 % (ref 0.0–7.0)
Eosinophils Absolute: 0.1 10*3/uL (ref 0.0–0.5)
HEMATOCRIT: 36.2 % (ref 34.8–46.6)
HEMOGLOBIN: 11.6 g/dL (ref 11.6–15.9)
LYMPH%: 27.1 % (ref 14.0–49.7)
MCH: 25.7 pg (ref 25.1–34.0)
MCHC: 32 g/dL (ref 31.5–36.0)
MCV: 80.3 fL (ref 79.5–101.0)
MONO#: 0.3 10*3/uL (ref 0.1–0.9)
MONO%: 12.4 % (ref 0.0–14.0)
NEUT%: 54.9 % (ref 38.4–76.8)
NEUTROS ABS: 1.4 10*3/uL — AB (ref 1.5–6.5)
Platelets: 304 10*3/uL (ref 145–400)
RBC: 4.51 10*6/uL (ref 3.70–5.45)
RDW: 21.5 % — ABNORMAL HIGH (ref 11.2–14.5)
WBC: 2.5 10*3/uL — ABNORMAL LOW (ref 3.9–10.3)
lymph#: 0.7 10*3/uL — ABNORMAL LOW (ref 0.9–3.3)

## 2014-06-16 LAB — COMPREHENSIVE METABOLIC PANEL (CC13)
ALT: 47 U/L (ref 0–55)
AST: 29 U/L (ref 5–34)
Albumin: 3.6 g/dL (ref 3.5–5.0)
Alkaline Phosphatase: 60 U/L (ref 40–150)
Anion Gap: 7 mEq/L (ref 3–11)
BILIRUBIN TOTAL: 0.38 mg/dL (ref 0.20–1.20)
BUN: 9 mg/dL (ref 7.0–26.0)
CALCIUM: 9.3 mg/dL (ref 8.4–10.4)
CHLORIDE: 109 meq/L (ref 98–109)
CO2: 25 meq/L (ref 22–29)
Creatinine: 0.8 mg/dL (ref 0.6–1.1)
EGFR: 85 mL/min/{1.73_m2} — ABNORMAL LOW (ref >90)
GLUCOSE: 126 mg/dL (ref 70–140)
Potassium: 4.2 mEq/L (ref 3.5–5.1)
SODIUM: 141 meq/L (ref 136–145)
TOTAL PROTEIN: 6 g/dL — AB (ref 6.4–8.3)

## 2014-06-16 MED ORDER — ONDANSETRON 8 MG/NS 50 ML IVPB
INTRAVENOUS | Status: AC
  Filled 2014-06-16: qty 8

## 2014-06-16 MED ORDER — HEPARIN SOD (PORK) LOCK FLUSH 100 UNIT/ML IV SOLN
500.0000 [IU] | Freq: Once | INTRAVENOUS | Status: AC | PRN
  Administered 2014-06-16: 500 [IU]
  Filled 2014-06-16: qty 5

## 2014-06-16 MED ORDER — SODIUM CHLORIDE 0.9 % IV SOLN
Freq: Once | INTRAVENOUS | Status: AC
  Administered 2014-06-16: 14:00:00 via INTRAVENOUS

## 2014-06-16 MED ORDER — ALTEPLASE 2 MG IJ SOLR
2.0000 mg | Freq: Once | INTRAMUSCULAR | Status: AC | PRN
  Administered 2014-06-16: 2 mg
  Filled 2014-06-16: qty 2

## 2014-06-16 MED ORDER — ONDANSETRON 8 MG/50ML IVPB (CHCC)
8.0000 mg | Freq: Once | INTRAVENOUS | Status: AC
  Administered 2014-06-16: 8 mg via INTRAVENOUS

## 2014-06-16 MED ORDER — SODIUM CHLORIDE 0.9 % IJ SOLN
10.0000 mL | INTRAMUSCULAR | Status: DC | PRN
  Administered 2014-06-16: 10 mL
  Filled 2014-06-16: qty 10

## 2014-06-16 MED ORDER — PACLITAXEL PROTEIN-BOUND CHEMO INJECTION 100 MG
85.0000 mg/m2 | Freq: Once | INTRAVENOUS | Status: AC
Start: 2014-06-16 — End: 2014-06-16
  Administered 2014-06-16: 200 mg via INTRAVENOUS
  Filled 2014-06-16: qty 40

## 2014-06-16 NOTE — Progress Notes (Signed)
Patient Care Team: Debbrah Alar, NP as PCP - General (Internal Medicine) Excell Seltzer, MD as Consulting Physician (General Surgery) Rulon Eisenmenger, MD as Consulting Physician (Hematology and Oncology) [name], MD as Attending Physician (Radiation Oncology)  DIAGNOSIS: Breast cancer of upper-outer quadrant of left female breast   Staging form: Breast, AJCC 7th Edition     Clinical: Stage IIB (T3, N0, cM0) - Unsigned       Staging comments: Staged at breast conference 12/31/13.      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 She is currently on Abraxane weekly. Today is week 3. She has been tolerating it fairly well with very minimal side effects. Denies any fevers or chills. Denies any nausea vomiting. Denies any diarrhea or constipation.  REVIEW OF SYSTEMS:   Constitutional: Denies fevers, chills  or abnormal weight loss Eyes: Denies blurriness of vision Ears, nose, mouth, throat, and face: Denies mucositis or sore throat Respiratory: Denies cough, dyspnea or wheezes Cardiovascular: Denies palpitation, chest discomfort or lower extremity swelling Gastrointestinal:  Denies nausea, heartburn or change in bowel habits Skin: Denies abnormal skin rashes Lymphatics: Denies new lymphadenopathy or easy bruising Neurological:Denies numbness, tingling or new weaknesses Behavioral/Psych: Mood is stable, no new changes  Breast:  denies any pain or lumps or nodules in either breasts All other systems were reviewed with the patient and are negative.  I have reviewed the past medical history, past surgical history, social history and family history with the patient and they are unchanged from previous note.  ALLERGIES:  is allergic to bee venom; contrast media; iodine; latex; penicillins; shellfish allergy; aspirin; erythromycin; lidocaine; symbicort; codeine; compazine; pentazocine; pentazocine lactate; sulfamethoxazole; and sulfonamide derivatives.  MEDICATIONS:  Current Outpatient Prescriptions  Medication Sig Dispense Refill  . albuterol (PROVENTIL HFA;VENTOLIN HFA) 108 (90 BASE) MCG/ACT inhaler Inhale 2 puffs into the lungs every 6 (six) hours as needed for wheezing or shortness of breath. Please dispense Proair 8.5 g 5  . albuterol (PROVENTIL) (5 MG/ML) 0.5% nebulizer solution Take 2.5 mg by nebulization every 6 (six) hours as needed for wheezing or shortness of breath.    . benzonatate (TESSALON) 200 MG capsule Take 1 capsule (200 mg total) by mouth 3 (three) times daily as needed for cough. 30 capsule 1  . Calcium Carbonate (CALCIUM 500 PO) Take 1 tablet by mouth daily.     Marland Kitchen dexamethasone (DECADRON) 4 MG tablet Take 2 tablets by mouth  once a day on the day after chemotherapy and then take 2 tablets two times a day for 2 days. Take with food. 30 tablet 1  . diphenhydrAMINE (BENADRYL) 25 MG  tablet Take 1 tablet an hour before MRI.  Then take 1 tablet at bedtime as needed, per your usual regimen. 30 tablet 0  . DULoxetine (CYMBALTA) 60 MG capsule TAKE 1 CAPSULE DAILY 90 capsule 0  . Ergocalciferol (VITAMIN D2) 400 UNITS TABS Take 1 tablet by mouth daily.    . Fluticasone-Salmeterol (ADVAIR DISKUS) 250-50 MCG/DOSE AEPB Inhale 1 puff into the lungs 2 (two) times daily. 180 each 1  . levothyroxine (SYNTHROID, LEVOTHROID) 150 MCG tablet Take 1 tablet (150 mcg total) by mouth daily. 30 tablet 1  . LORazepam (ATIVAN) 0.5 MG tablet Take 1 tablet (0.5 mg total) by mouth every 6 (six) hours as needed (Nausea or vomiting). 30 tablet 0  . meloxicam (MOBIC) 7.5 MG tablet TAKE 1 TABLET DAILY 90 tablet 0  . Multiple Vitamin (MULTIVITAMIN) tablet Take 1 tablet by mouth daily.      . nitroGLYCERIN (NITROSTAT) 0.4 MG SL tablet Place 1 tablet (0.4 mg total) under the tongue every 5 (five) minutes as needed for chest pain. 30 tablet 0  . Omega-3 Fatty Acids (FISH OIL) 1200 MG CAPS Take 1 capsule by mouth daily.      Marland Kitchen omeprazole (PRILOSEC) 40 MG capsule Take 1 capsule (40 mg total) by mouth daily. 90 capsule 1  . ondansetron (ZOFRAN) 8 MG tablet Take 1 tablet (8 mg total) by mouth 2 (two) times daily as needed. Start on the third day after chemotherapy. 30 tablet 1  . oxybutynin (DITROPAN-XL) 5 MG 24 hr tablet Take 5 mg by mouth daily.    Marland Kitchen oxyCODONE-acetaminophen (ROXICET) 5-325 MG per tablet 1-2 tablets q 4h prn 20 tablet 0  . prochlorperazine (COMPAZINE) 10 MG tablet   1  . simvastatin (ZOCOR) 40 MG tablet TAKE 1 TABLET DAILY 90 tablet 0  . traMADol (ULTRAM) 50 MG tablet     . EPINEPHrine 0.3 mg/0.3 mL IJ SOAJ injection Inject 0.3 mLs (0.3 mg total) into the muscle once. (Patient not taking: Reported on 06/16/2014) 2 Device 0  . nystatin (MYCOSTATIN) 100000 UNIT/ML suspension Take 5 mLs (500,000 Units total) by mouth 4 (four) times daily. (Patient not taking: Reported on 06/16/2014) 240 mL 0   No  current facility-administered medications for this visit.    PHYSICAL EXAMINATION: ECOG PERFORMANCE STATUS: 1 - Symptomatic but completely ambulatory  Filed Vitals:   06/16/14 1038  BP: 153/95  Pulse: 85  Temp: 97.6 F (36.4 C)  Resp: 19   Filed Weights   06/16/14 1038  Weight: 262 lb (118.842 kg)    GENERAL:alert, no distress and comfortable SKIN: skin color, texture, turgor are normal, no rashes or significant lesions EYES: normal, Conjunctiva are pink and non-injected, sclera clear OROPHARYNX:no exudate, no erythema and lips, buccal mucosa, and tongue normal  NECK: supple, thyroid normal size, non-tender, without nodularity LYMPH:  no palpable lymphadenopathy in the cervical, axillary or inguinal LUNGS: clear to auscultation and percussion with normal breathing effort HEART: regular rate & rhythm and no murmurs and no lower extremity edema ABDOMEN:abdomen soft, non-tender and normal bowel sounds Musculoskeletal:no cyanosis of digits and no clubbing  NEURO: alert & oriented x 3 with fluent speech, no focal motor/sensory deficits  LABORATORY DATA:  I have reviewed the data as listed   Chemistry      Component Value Date/Time  NA 141 06/16/2014 1026   NA 140 05/31/2011 0905   K 4.2 06/16/2014 1026   K 4.6 05/31/2011 0905   CL 106 05/31/2011 0905   CO2 25 06/16/2014 1026   CO2 24 05/31/2011 0905   BUN 9.0 06/16/2014 1026   BUN 17 05/31/2011 0905   CREATININE 0.8 06/16/2014 1026   CREATININE 0.83 05/31/2011 0905   CREATININE 0.73 12/21/2009 1937      Component Value Date/Time   CALCIUM 9.3 06/16/2014 1026   CALCIUM 9.4 05/31/2011 0905   ALKPHOS 60 06/16/2014 1026   ALKPHOS 52 08/11/2013 0918   AST 29 06/16/2014 1026   AST 19 08/11/2013 0918   ALT 47 06/16/2014 1026   ALT 24 08/11/2013 0918   BILITOT 0.38 06/16/2014 1026   BILITOT 0.4 08/11/2013 0918       Lab Results  Component Value Date   WBC 2.5* 06/16/2014   HGB 11.6 06/16/2014   HCT 36.2  06/16/2014   MCV 80.3 06/16/2014   PLT 304 06/16/2014   NEUTROABS 1.4* 06/16/2014    ASSESSMENT & PLAN:  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 M0 stage IIB  Current treatment: Completed 4 cycles of dose dense Adriamycin and Cytoxan. Today is week 3/12 Abraxane  Toxicities of chemotherapy: 1. Chemotherapy-induced neutropenia: ANC 1400 today. I reduced the dosage of Abraxane to 85 mg/m.  2. Chemotherapy-induced diarrhea resolved with Imodium 3. Esophageal spasms diminished  4. Fatigue related to chemotherapy 5. Alopecia Monitoring closely for chemotherapy toxicities Blood counts were reviewed and she can get treated today even with an [place] of 1400. Return to clinic in 2 weeks for clinic follow-up but weekly for chemotherapy with Abraxane.    Orders Placed This Encounter  Procedures  . CBC with Differential    Standing Status: Future     Number of Occurrences:      Standing Expiration Date: 06/16/2015  . Comprehensive metabolic panel (Cmet) - [facility]    Standing Status: Future     Number of Occurrences:      Standing Expiration Date: 06/16/2015   The patient has a good understanding of the overall plan. she agrees with it. She will call with any problems that may develop before her next visit here.   Rulon Eisenmenger, MD

## 2014-06-16 NOTE — Patient Instructions (Signed)
[facility] Discharge Instructions for Patients Receiving Chemotherapy  Today you received the following chemotherapy agents:  Abraxane  To help prevent nausea and vomiting after your treatment, we encourage you to take your nausea medication as ordered per MD.   If you develop nausea and vomiting that is not controlled by your nausea medication, call the clinic.   BELOW ARE SYMPTOMS THAT SHOULD BE REPORTED IMMEDIATELY:  *FEVER GREATER THAN 100.5 F  *CHILLS WITH OR WITHOUT FEVER  NAUSEA AND VOMITING THAT IS NOT CONTROLLED WITH YOUR NAUSEA MEDICATION  *UNUSUAL SHORTNESS OF BREATH  *UNUSUAL BRUISING OR BLEEDING  TENDERNESS IN MOUTH AND THROAT WITH OR WITHOUT PRESENCE OF ULCERS  *URINARY PROBLEMS  *BOWEL PROBLEMS  UNUSUAL RASH Items with * indicate a potential emergency and should be followed up as soon as possible.  Feel free to call the clinic you have any questions or concerns. The clinic phone number is [phone].    

## 2014-06-16 NOTE — Assessment & Plan Note (Addendum)
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 M0 stage IIB  Current treatment: Completed 4 cycles of dose dense Adriamycin and Cytoxan. Today is week 4/12 Abraxane  Toxicities of chemotherapy: 1. Chemotherapy-induced neutropenia: ANC 1400 today. I reduced the dosage of Abraxane to 85 mg/m.  2. Chemotherapy-induced diarrhea resolved with Imodium 3. Esophageal spasms diminished  4. Fatigue related to chemotherapy 5. Alopecia Monitoring closely for chemotherapy toxicities Blood counts were reviewed and she can get treated today even with an [place] of 1400. Return to clinic in 2 weeks for clinic follow-up but weekly for chemotherapy with Abraxane.

## 2014-06-16 NOTE — Progress Notes (Signed)
Ok to tx pt with labs from today per Dr. Lindi Adie.

## 2014-06-19 ENCOUNTER — Telehealth: Payer: Self-pay | Admitting: Family

## 2014-06-19 NOTE — Telephone Encounter (Signed)
See mychart.  

## 2014-06-23 ENCOUNTER — Ambulatory Visit (HOSPITAL_BASED_OUTPATIENT_CLINIC_OR_DEPARTMENT_OTHER): Payer: BLUE CROSS/BLUE SHIELD

## 2014-06-23 ENCOUNTER — Encounter: Payer: Self-pay | Admitting: Nurse Practitioner

## 2014-06-23 ENCOUNTER — Ambulatory Visit (HOSPITAL_BASED_OUTPATIENT_CLINIC_OR_DEPARTMENT_OTHER): Payer: BLUE CROSS/BLUE SHIELD | Admitting: Nurse Practitioner

## 2014-06-23 ENCOUNTER — Other Ambulatory Visit (HOSPITAL_BASED_OUTPATIENT_CLINIC_OR_DEPARTMENT_OTHER): Payer: BLUE CROSS/BLUE SHIELD

## 2014-06-23 VITALS — BP 148/86 | HR 87 | Temp 98.4°F | Resp 18 | Ht 68.0 in | Wt 261.2 lb

## 2014-06-23 DIAGNOSIS — C50412 Malignant neoplasm of upper-outer quadrant of left female breast: Secondary | ICD-10-CM

## 2014-06-23 DIAGNOSIS — D709 Neutropenia, unspecified: Secondary | ICD-10-CM

## 2014-06-23 DIAGNOSIS — Z5111 Encounter for antineoplastic chemotherapy: Secondary | ICD-10-CM

## 2014-06-23 LAB — CBC WITH DIFFERENTIAL/PLATELET
BASO%: 1.2 % (ref 0.0–2.0)
Basophils Absolute: 0.1 10*3/uL (ref 0.0–0.1)
EOS ABS: 0.2 10*3/uL (ref 0.0–0.5)
EOS%: 3.5 % (ref 0.0–7.0)
HCT: 37.9 % (ref 34.8–46.6)
HEMOGLOBIN: 11.8 g/dL (ref 11.6–15.9)
LYMPH%: 21.4 % (ref 14.0–49.7)
MCH: 25.2 pg (ref 25.1–34.0)
MCHC: 31.1 g/dL — AB (ref 31.5–36.0)
MCV: 80.9 fL (ref 79.5–101.0)
MONO#: 0.5 10*3/uL (ref 0.1–0.9)
MONO%: 10.7 % (ref 0.0–14.0)
NEUT#: 3.1 10*3/uL (ref 1.5–6.5)
NEUT%: 63.2 % (ref 38.4–76.8)
PLATELETS: 321 10*3/uL (ref 145–400)
RBC: 4.68 10*6/uL (ref 3.70–5.45)
RDW: 23.5 % — ABNORMAL HIGH (ref 11.2–14.5)
WBC: 4.9 10*3/uL (ref 3.9–10.3)
lymph#: 1 10*3/uL (ref 0.9–3.3)

## 2014-06-23 LAB — COMPREHENSIVE METABOLIC PANEL (CC13)
ALT: 43 U/L (ref 0–55)
AST: 26 U/L (ref 5–34)
Albumin: 3.7 g/dL (ref 3.5–5.0)
Alkaline Phosphatase: 63 U/L (ref 40–150)
Anion Gap: 8 mEq/L (ref 3–11)
BUN: 12.2 mg/dL (ref 7.0–26.0)
CO2: 28 meq/L (ref 22–29)
CREATININE: 0.8 mg/dL (ref 0.6–1.1)
Calcium: 9 mg/dL (ref 8.4–10.4)
Chloride: 107 mEq/L (ref 98–109)
EGFR: 78 mL/min/{1.73_m2} — ABNORMAL LOW (ref >90)
Glucose: 97 mg/dl (ref 70–140)
Potassium: 4 mEq/L (ref 3.5–5.1)
Sodium: 143 mEq/L (ref 136–145)
TOTAL PROTEIN: 6.3 g/dL — AB (ref 6.4–8.3)
Total Bilirubin: 0.28 mg/dL (ref 0.20–1.20)

## 2014-06-23 MED ORDER — ONDANSETRON 8 MG/NS 50 ML IVPB
INTRAVENOUS | Status: AC
  Filled 2014-06-23: qty 8

## 2014-06-23 MED ORDER — SODIUM CHLORIDE 0.9 % IJ SOLN
10.0000 mL | INTRAMUSCULAR | Status: DC | PRN
  Administered 2014-06-23: 10 mL
  Filled 2014-06-23: qty 10

## 2014-06-23 MED ORDER — HEPARIN SOD (PORK) LOCK FLUSH 100 UNIT/ML IV SOLN
500.0000 [IU] | Freq: Once | INTRAVENOUS | Status: AC | PRN
  Administered 2014-06-23: 500 [IU]
  Filled 2014-06-23: qty 5

## 2014-06-23 MED ORDER — PACLITAXEL PROTEIN-BOUND CHEMO INJECTION 100 MG
85.0000 mg/m2 | Freq: Once | INTRAVENOUS | Status: AC
  Administered 2014-06-23: 200 mg via INTRAVENOUS
  Filled 2014-06-23: qty 40

## 2014-06-23 MED ORDER — ONDANSETRON 8 MG/50ML IVPB (CHCC)
8.0000 mg | Freq: Once | INTRAVENOUS | Status: AC
  Administered 2014-06-23: 8 mg via INTRAVENOUS

## 2014-06-23 MED ORDER — SODIUM CHLORIDE 0.9 % IV SOLN
Freq: Once | INTRAVENOUS | Status: AC
  Administered 2014-06-23: 15:00:00 via INTRAVENOUS

## 2014-06-23 NOTE — Progress Notes (Signed)
Patient Care Team: Debbrah Alar, NP as PCP - General (Internal Medicine) Excell Seltzer, MD as Consulting Physician (General Surgery) Rulon Eisenmenger, MD as Consulting Physician (Hematology and Oncology) [name], MD as Attending Physician (Radiation Oncology)  DIAGNOSIS: Breast cancer of upper-outer quadrant of left female breast   Staging form: Breast, AJCC 7th Edition     Clinical: Stage IIB (T3, N0, cM0) - Unsigned       Staging comments: Staged at breast conference 12/31/13.      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 She is currently on Abraxane weekly. Today is week 4. She continues to tolerate it well with few side effects to speak of.  Specifically she denies fevers, chills, nausea, vomiting, or changes in bowel or bladder habit. She has no mouth sore, rashes, or peripheral neuropathy  symptoms. She is eating and drinking well. She denies excessive fatigue.   REVIEW OF SYSTEMS:   Constitutional: Denies fevers, chills or abnormal weight loss Eyes: Denies blurriness of vision Ears, nose, mouth, throat, and face: Denies mucositis or sore throat Respiratory: Denies cough, dyspnea or wheezes Cardiovascular: Denies palpitation, chest discomfort or lower extremity swelling Gastrointestinal:  Denies nausea, heartburn or change in bowel habits Skin: Denies abnormal skin rashes Lymphatics: Denies new lymphadenopathy or easy bruising Neurological:Denies numbness, tingling or new weaknesses Behavioral/Psych: Mood is stable, no new changes  Breast:  denies any pain or lumps or nodules in either breasts All other systems were reviewed with the patient and are negative.  I have reviewed the past medical history, past surgical history, social history and family history with the patient and they are unchanged from previous note.  ALLERGIES:  is allergic to bee venom; contrast media; iodine; latex; penicillins; shellfish allergy; aspirin; erythromycin; lidocaine; symbicort; codeine; compazine; pentazocine; pentazocine lactate; sulfamethoxazole; and sulfonamide derivatives.  MEDICATIONS:  Current Outpatient Prescriptions  Medication Sig Dispense Refill  . albuterol (PROVENTIL HFA;VENTOLIN HFA) 108 (90 BASE) MCG/ACT inhaler Inhale 2 puffs into the lungs every 6 (six) hours as needed for wheezing or shortness of breath. Please dispense Proair 8.5 g 5  . benzonatate (TESSALON) 200 MG capsule Take 1 capsule (200 mg total) by mouth 3 (three) times daily as needed for cough. 30 capsule 1  . Calcium Carbonate (CALCIUM 500 PO) Take 1 tablet by mouth daily.     . diphenhydrAMINE (BENADRYL) 25 MG tablet Take 1 tablet an hour before MRI.  Then take  1 tablet at bedtime as needed, per your usual regimen. 30 tablet 0  . DULoxetine (CYMBALTA) 60 MG capsule TAKE 1 CAPSULE DAILY 90 capsule 0  .  Ergocalciferol (VITAMIN D2) 400 UNITS TABS Take 1 tablet by mouth daily.    . Fluticasone-Salmeterol (ADVAIR DISKUS) 250-50 MCG/DOSE AEPB Inhale 1 puff into the lungs 2 (two) times daily. 180 each 1  . levothyroxine (SYNTHROID, LEVOTHROID) 150 MCG tablet Take 1 tablet (150 mcg total) by mouth daily. 30 tablet 1  . LORazepam (ATIVAN) 0.5 MG tablet Take 1 tablet (0.5 mg total) by mouth every 6 (six) hours as needed (Nausea or vomiting). 30 tablet 0  . meloxicam (MOBIC) 7.5 MG tablet TAKE 1 TABLET DAILY 90 tablet 0  . Multiple Vitamin (MULTIVITAMIN) tablet Take 1 tablet by mouth daily.      . nitroGLYCERIN (NITROSTAT) 0.4 MG SL tablet Place 1 tablet (0.4 mg total) under the tongue every 5 (five) minutes as needed for chest pain. 30 tablet 0  . nystatin (MYCOSTATIN) 100000 UNIT/ML suspension Take 5 mLs (500,000 Units total) by mouth 4 (four) times daily. 240 mL 0  . Omega-3 Fatty Acids (FISH OIL) 1200 MG CAPS Take 1 capsule by mouth daily.      Marland Kitchen omeprazole (PRILOSEC) 40 MG capsule Take 1 capsule (40 mg total) by mouth daily. 90 capsule 1  . ondansetron (ZOFRAN) 8 MG tablet Take 1 tablet (8 mg total) by mouth 2 (two) times daily as needed. Start on the third day after chemotherapy. 30 tablet 1  . oxybutynin (DITROPAN-XL) 5 MG 24 hr tablet Take 5 mg by mouth daily.    . simvastatin (ZOCOR) 40 MG tablet TAKE 1 TABLET DAILY 90 tablet 0  . traMADol (ULTRAM) 50 MG tablet     . albuterol (PROVENTIL) (5 MG/ML) 0.5% nebulizer solution Take 2.5 mg by nebulization every 6 (six) hours as needed for wheezing or shortness of breath.    . dexamethasone (DECADRON) 4 MG tablet Take 2 tablets by mouth once a day on the day after chemotherapy and then take 2 tablets two times a day for 2 days. Take with food. (Patient not taking: Reported on 06/23/2014) 30 tablet 1  . EPINEPHrine 0.3 mg/0.3 mL IJ SOAJ injection Inject 0.3 mLs (0.3 mg total) into the muscle once. (Patient not taking: Reported on 06/16/2014) 2 Device 0  .  oxyCODONE-acetaminophen (ROXICET) 5-325 MG per tablet 1-2 tablets q 4h prn (Patient not taking: Reported on 06/23/2014) 20 tablet 0  . prochlorperazine (COMPAZINE) 10 MG tablet   1   No current facility-administered medications for this visit.   Facility-Administered Medications Ordered in Other Visits  Medication Dose Route Frequency Provider Last Rate Last Dose  . sodium chloride 0.9 % injection 10 mL  10 mL Intracatheter PRN Rulon Eisenmenger, MD   10 mL at 06/23/14 1620    PHYSICAL EXAMINATION: ECOG PERFORMANCE STATUS: 1 - Symptomatic but completely ambulatory  Filed Vitals:   06/23/14 1406  BP: 148/86  Pulse: 87  Temp: 98.4 F (36.9 C)  Resp: 18   Filed Weights   06/23/14 1406  Weight: 261 lb 3.2 oz (118.48 kg)    Skin: warm, dry  HEENT: sclerae anicteric, conjunctivae pink, oropharynx clear. No thrush or mucositis.  Lymph Nodes: No cervical or supraclavicular lymphadenopathy  Lungs: clear to auscultation bilaterally, no rales, wheezes, or rhonci  Heart: regular rate and rhythm  Abdomen: round, soft, non tender, positive bowel sounds  Musculoskeletal: No focal spinal tenderness, no  peripheral edema  Neuro: non focal, well oriented, positive affect  Breasts: deferred  LABORATORY DATA:  I have reviewed the data as listed   Chemistry      Component Value Date/Time   NA 143 06/23/2014 1347   NA 140 05/31/2011 0905   K 4.0 06/23/2014 1347   K 4.6 05/31/2011 0905   CL 106 05/31/2011 0905   CO2 28 06/23/2014 1347   CO2 24 05/31/2011 0905   BUN 12.2 06/23/2014 1347   BUN 17 05/31/2011 0905   CREATININE 0.8 06/23/2014 1347   CREATININE 0.83 05/31/2011 0905   CREATININE 0.73 12/21/2009 1937      Component Value Date/Time   CALCIUM 9.0 06/23/2014 1347   CALCIUM 9.4 05/31/2011 0905   ALKPHOS 63 06/23/2014 1347   ALKPHOS 52 08/11/2013 0918   AST 26 06/23/2014 1347   AST 19 08/11/2013 0918   ALT 43 06/23/2014 1347   ALT 24 08/11/2013 0918   BILITOT 0.28 06/23/2014  1347   BILITOT 0.4 08/11/2013 0918       Lab Results  Component Value Date   WBC 4.9 06/23/2014   HGB 11.8 06/23/2014   HCT 37.9 06/23/2014   MCV 80.9 06/23/2014   PLT 321 06/23/2014   NEUTROABS 3.1 06/23/2014    ASSESSMENT & PLAN:  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 M0 stage IIB  Current treatment: Completed 4 cycles of dose dense Adriamycin and Cytoxan. Today is week 4/12 Abraxane. The dose was reduced to 71m/m2 because of neutropenia starting with cycle 3.   MBrandaceis doing well today. The labs were reviewed in detail and were entirely stable. Her ANC is up to 3.1 today. She will proceed with cycle 4 of paclitaxel as scheduled today.   MSenecawill return next week for labs, a follow up visit, and cycle 5 of treatment.    Orders Placed This Encounter  Procedures  . Comprehensive metabolic panel    Standing Status: Future     Number of Occurrences:      Standing Expiration Date: 06/23/2015  . CBC with Differential    Standing Status: Future     Number of Occurrences:      Standing Expiration Date: 06/23/2015   The patient has a good understanding of the overall plan. she agrees with it. She will call with any problems that may develop before her next visit here.   FMarcelino Duster NP

## 2014-06-23 NOTE — Patient Instructions (Signed)
[facility] Discharge Instructions for Patients Receiving Chemotherapy  Today you received the following chemotherapy agents Abraxane  To help prevent nausea and vomiting after your treatment, we encourage you to take your nausea medication as prescribed.   If you develop nausea and vomiting that is not controlled by your nausea medication, call the clinic.   BELOW ARE SYMPTOMS THAT SHOULD BE REPORTED IMMEDIATELY:  *FEVER GREATER THAN 100.5 F  *CHILLS WITH OR WITHOUT FEVER  NAUSEA AND VOMITING THAT IS NOT CONTROLLED WITH YOUR NAUSEA MEDICATION  *UNUSUAL SHORTNESS OF BREATH  *UNUSUAL BRUISING OR BLEEDING  TENDERNESS IN MOUTH AND THROAT WITH OR WITHOUT PRESENCE OF ULCERS  *URINARY PROBLEMS  *BOWEL PROBLEMS  UNUSUAL RASH Items with * indicate a potential emergency and should be followed up as soon as possible.  Feel free to call the clinic you have any questions or concerns. The clinic phone number is [phone].    

## 2014-06-27 ENCOUNTER — Other Ambulatory Visit: Payer: Self-pay | Admitting: Family

## 2014-06-29 MED ORDER — MELOXICAM 7.5 MG PO TABS: 7.5000 mg | ORAL_TABLET | Freq: Every day | ORAL | Status: DC

## 2014-06-29 NOTE — Telephone Encounter (Signed)
Ok to send 30 day supply no refills. Due for follow up pls.

## 2014-06-29 NOTE — Telephone Encounter (Signed)
Pt currently undergoing cancer treatments and has no future appts scheduled with [place].  Please advise below request:  Medication name:  Name from pharmacy:  meloxicam (MOBIC) 7.5 MG tablet MELOXICAM TABS 7.5MG      Sig: TAKE 1 TABLET DAILY    Dispense: 90 tablet   Start: 06/27/2014   Class: Normal    Requested on: 06/27/2014    Originally ordered on: 05/31/2011 03/10/2014

## 2014-06-29 NOTE — Telephone Encounter (Signed)
30 day supply sent to [facility]. Please call pt to arrange follow up per PCP instruction below.

## 2014-06-30 ENCOUNTER — Telehealth: Payer: Self-pay | Admitting: Hematology and Oncology

## 2014-06-30 ENCOUNTER — Ambulatory Visit: Payer: BC Managed Care – PPO | Admitting: Hematology and Oncology

## 2014-06-30 ENCOUNTER — Other Ambulatory Visit: Payer: BC Managed Care – PPO

## 2014-06-30 ENCOUNTER — Ambulatory Visit (HOSPITAL_BASED_OUTPATIENT_CLINIC_OR_DEPARTMENT_OTHER): Payer: BLUE CROSS/BLUE SHIELD

## 2014-06-30 ENCOUNTER — Ambulatory Visit (HOSPITAL_BASED_OUTPATIENT_CLINIC_OR_DEPARTMENT_OTHER): Payer: BLUE CROSS/BLUE SHIELD | Admitting: Hematology and Oncology

## 2014-06-30 ENCOUNTER — Other Ambulatory Visit (HOSPITAL_BASED_OUTPATIENT_CLINIC_OR_DEPARTMENT_OTHER): Payer: BLUE CROSS/BLUE SHIELD

## 2014-06-30 VITALS — BP 135/93 | HR 83 | Temp 97.8°F | Resp 19 | Ht 68.0 in | Wt 259.7 lb

## 2014-06-30 DIAGNOSIS — L658 Other specified nonscarring hair loss: Secondary | ICD-10-CM

## 2014-06-30 DIAGNOSIS — R5383 Other fatigue: Secondary | ICD-10-CM

## 2014-06-30 DIAGNOSIS — C50412 Malignant neoplasm of upper-outer quadrant of left female breast: Secondary | ICD-10-CM

## 2014-06-30 DIAGNOSIS — Z5111 Encounter for antineoplastic chemotherapy: Secondary | ICD-10-CM

## 2014-06-30 DIAGNOSIS — D702 Other drug-induced agranulocytosis: Secondary | ICD-10-CM

## 2014-06-30 LAB — CBC WITH DIFFERENTIAL/PLATELET
BASO%: 1.2 % (ref 0.0–2.0)
Basophils Absolute: 0.1 10*3/uL (ref 0.0–0.1)
EOS%: 2.2 % (ref 0.0–7.0)
Eosinophils Absolute: 0.1 10*3/uL (ref 0.0–0.5)
HCT: 37.7 % (ref 34.8–46.6)
HGB: 11.9 g/dL (ref 11.6–15.9)
LYMPH%: 24.1 % (ref 14.0–49.7)
MCH: 25.9 pg (ref 25.1–34.0)
MCHC: 31.6 g/dL (ref 31.5–36.0)
MCV: 82.1 fL (ref 79.5–101.0)
MONO#: 0.5 10*3/uL (ref 0.1–0.9)
MONO%: 9.2 % (ref 0.0–14.0)
NEUT#: 3.3 10*3/uL (ref 1.5–6.5)
NEUT%: 63.3 % (ref 38.4–76.8)
Platelets: 299 10*3/uL (ref 145–400)
RBC: 4.59 10*6/uL (ref 3.70–5.45)
RDW: 23.2 % — ABNORMAL HIGH (ref 11.2–14.5)
WBC: 5.2 10*3/uL (ref 3.9–10.3)
lymph#: 1.3 10*3/uL (ref 0.9–3.3)

## 2014-06-30 LAB — COMPREHENSIVE METABOLIC PANEL (CC13)
ALT: 39 U/L (ref 0–55)
AST: 25 U/L (ref 5–34)
Albumin: 3.8 g/dL (ref 3.5–5.0)
Alkaline Phosphatase: 60 U/L (ref 40–150)
Anion Gap: 10 mEq/L (ref 3–11)
BUN: 12.5 mg/dL (ref 7.0–26.0)
CO2: 25 mEq/L (ref 22–29)
Calcium: 9.8 mg/dL (ref 8.4–10.4)
Chloride: 107 mEq/L (ref 98–109)
Creatinine: 0.9 mg/dL (ref 0.6–1.1)
EGFR: 74 mL/min/{1.73_m2} — ABNORMAL LOW (ref >90)
Glucose: 96 mg/dl (ref 70–140)
Potassium: 4 mEq/L (ref 3.5–5.1)
Sodium: 142 mEq/L (ref 136–145)
Total Bilirubin: 0.38 mg/dL (ref 0.20–1.20)
Total Protein: 6.4 g/dL (ref 6.4–8.3)

## 2014-06-30 MED ORDER — SODIUM CHLORIDE 0.9 % IV SOLN
Freq: Once | INTRAVENOUS | Status: AC
  Administered 2014-06-30: 12:00:00 via INTRAVENOUS

## 2014-06-30 MED ORDER — ONDANSETRON 8 MG/NS 50 ML IVPB
INTRAVENOUS | Status: AC
  Filled 2014-06-30: qty 8

## 2014-06-30 MED ORDER — PACLITAXEL PROTEIN-BOUND CHEMO INJECTION 100 MG
85.0000 mg/m2 | Freq: Once | INTRAVENOUS | Status: AC
  Administered 2014-06-30: 200 mg via INTRAVENOUS
  Filled 2014-06-30: qty 40

## 2014-06-30 MED ORDER — SODIUM CHLORIDE 0.9 % IJ SOLN
10.0000 mL | INTRAMUSCULAR | Status: DC | PRN
  Filled 2014-06-30: qty 10

## 2014-06-30 MED ORDER — HEPARIN SOD (PORK) LOCK FLUSH 100 UNIT/ML IV SOLN
500.0000 [IU] | Freq: Once | INTRAVENOUS | Status: DC | PRN
  Filled 2014-06-30: qty 5

## 2014-06-30 MED ORDER — ONDANSETRON 8 MG/50ML IVPB (CHCC)
8.0000 mg | Freq: Once | INTRAVENOUS | Status: AC
  Administered 2014-06-30: 8 mg via INTRAVENOUS

## 2014-06-30 NOTE — Addendum Note (Signed)
Addended by: Prentiss Bells on: 06/30/2014 11:38 AM   Modules accepted: Medications

## 2014-06-30 NOTE — Assessment & Plan Note (Addendum)
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 M0 stage IIB  Current treatment: Completed 4 cycles of dose dense Adriamycin and Cytoxan. Today is week 5/12 Abraxane  Toxicities of chemotherapy: 1. Chemotherapy-induced neutropenia: ANC 3300 today. I reduced the dosage of Abraxane to 85 mg/m from week 3  2. Chemotherapy-induced diarrhea resolved with Imodium 3. Esophageal spasms diminished  4. Fatigue related to chemotherapy 5. Alopecia Monitoring closely for chemotherapy toxicities.  Blood counts were reviewed and she can get treated today even with an [place] of 3300. Return to clinic in 3 weeks for clinic follow-up but weekly for chemotherapy with Abraxane.

## 2014-06-30 NOTE — Telephone Encounter (Signed)
Informed patient of med refill and she states that she will callback tomorrow to schedule appointment

## 2014-06-30 NOTE — Patient Instructions (Signed)
[facility] Discharge Instructions for Patients Receiving Chemotherapy  Today you received the following chemotherapy agents Abraxane  To help prevent nausea and vomiting after your treatment, we encourage you to take your nausea medication as prescribed.   If you develop nausea and vomiting that is not controlled by your nausea medication, call the clinic.   BELOW ARE SYMPTOMS THAT SHOULD BE REPORTED IMMEDIATELY:  *FEVER GREATER THAN 100.5 F  *CHILLS WITH OR WITHOUT FEVER  NAUSEA AND VOMITING THAT IS NOT CONTROLLED WITH YOUR NAUSEA MEDICATION  *UNUSUAL SHORTNESS OF BREATH  *UNUSUAL BRUISING OR BLEEDING  TENDERNESS IN MOUTH AND THROAT WITH OR WITHOUT PRESENCE OF ULCERS  *URINARY PROBLEMS  *BOWEL PROBLEMS  UNUSUAL RASH Items with * indicate a potential emergency and should be followed up as soon as possible.  Feel free to call the clinic you have any questions or concerns. The clinic phone number is [phone].    

## 2014-06-30 NOTE — Telephone Encounter (Signed)
, °

## 2014-06-30 NOTE — Progress Notes (Signed)
Patient Care Team: Debbrah Alar, NP as PCP - General (Internal Medicine) Excell Seltzer, MD as Consulting Physician (General Surgery) Rulon Eisenmenger, MD as Consulting Physician (Hematology and Oncology) [name], MD as Attending Physician (Radiation Oncology)  DIAGNOSIS: Breast cancer of upper-outer quadrant of left female breast   Staging form: Breast, AJCC 7th Edition     Clinical: Stage IIB (T3, N0, cM0) - Unsigned       Staging comments: Staged at breast conference 12/31/13.      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 Today is cycle 5 of Abraxane. She had been tolerating Abraxane extremely well without any major problems or concerns. She denies any neuropathy denies any nausea vomiting denies any diarrhea or constipation. taste is different so she cannot taste carrots which he  loves.  REVIEW OF SYSTEMS:   Constitutional: Denies fevers, chills or abnormal weight loss Eyes: Denies blurriness of vision Ears, nose, mouth, throat, and face: Denies mucositis or sore throat Respiratory: Denies cough, dyspnea or wheezes Cardiovascular: Denies palpitation, chest discomfort or lower extremity swelling Gastrointestinal:  Denies nausea, heartburn or change in bowel habits Skin: Denies abnormal skin rashes Lymphatics: Denies new lymphadenopathy or easy bruising Neurological:Denies numbness, tingling or new weaknesses Behavioral/Psych: Mood is stable, no new changes  Breast:  denies any pain or lumps or nodules in either breasts All other systems were reviewed with the patient and are negative.  I have reviewed the past medical history, past surgical history, social history and family history with the patient and they are unchanged from previous note.  ALLERGIES:  is allergic to bee venom; contrast media; iodine; latex; penicillins; shellfish allergy; aspirin; erythromycin; lidocaine; symbicort; codeine; compazine; pentazocine; pentazocine lactate; sulfamethoxazole; and sulfonamide derivatives.  MEDICATIONS:  Current Outpatient Prescriptions  Medication Sig Dispense Refill  . albuterol (PROVENTIL HFA;VENTOLIN HFA) 108 (90 BASE) MCG/ACT inhaler Inhale 2 puffs into the lungs every 6 (six) hours as needed for wheezing or shortness of breath. Please dispense Proair 8.5 g 5  . albuterol (PROVENTIL) (5 MG/ML) 0.5% nebulizer solution Take 2.5 mg by nebulization every 6 (six) hours as needed for wheezing or shortness of breath.    . benzonatate (TESSALON) 200 MG capsule Take 1 capsule (200 mg total) by mouth 3 (three) times daily as needed for cough. 30 capsule 1  . Calcium Carbonate (CALCIUM 500 PO) Take 1 tablet by mouth daily.     Marland Kitchen dexamethasone (  DECADRON) 4 MG tablet Take 2 tablets by mouth once a day on the day after chemotherapy and then take 2 tablets two times a day for 2  days. Take with food. (Patient not taking: Reported on 06/23/2014) 30 tablet 1  . diphenhydrAMINE (BENADRYL) 25 MG tablet Take 1 tablet an hour before MRI.  Then take 1 tablet at bedtime as needed, per your usual regimen. 30 tablet 0  . DULoxetine (CYMBALTA) 60 MG capsule TAKE 1 CAPSULE DAILY 90 capsule 0  . EPINEPHrine 0.3 mg/0.3 mL IJ SOAJ injection Inject 0.3 mLs (0.3 mg total) into the muscle once. (Patient not taking: Reported on 06/16/2014) 2 Device 0  . Ergocalciferol (VITAMIN D2) 400 UNITS TABS Take 1 tablet by mouth daily.    . Fluticasone-Salmeterol (ADVAIR DISKUS) 250-50 MCG/DOSE AEPB Inhale 1 puff into the lungs 2 (two) times daily. 180 each 1  . levothyroxine (SYNTHROID, LEVOTHROID) 150 MCG tablet Take 1 tablet (150 mcg total) by mouth daily. 30 tablet 1  . LORazepam (ATIVAN) 0.5 MG tablet Take 1 tablet (0.5 mg total) by mouth every 6 (six) hours as needed (Nausea or vomiting). 30 tablet 0  . meloxicam (MOBIC) 7.5 MG tablet Take 1 tablet (7.5 mg total) by mouth daily. 30 tablet 0  . Multiple Vitamin (MULTIVITAMIN) tablet Take 1 tablet by mouth daily.      . nitroGLYCERIN (NITROSTAT) 0.4 MG SL tablet Place 1 tablet (0.4 mg total) under the tongue every 5 (five) minutes as needed for chest pain. 30 tablet 0  . nystatin (MYCOSTATIN) 100000 UNIT/ML suspension Take 5 mLs (500,000 Units total) by mouth 4 (four) times daily. 240 mL 0  . Omega-3 Fatty Acids (FISH OIL) 1200 MG CAPS Take 1 capsule by mouth daily.      Marland Kitchen omeprazole (PRILOSEC) 40 MG capsule Take 1 capsule (40 mg total) by mouth daily. 90 capsule 1  . ondansetron (ZOFRAN) 8 MG tablet Take 1 tablet (8 mg total) by mouth 2 (two) times daily as needed. Start on the third day after chemotherapy. 30 tablet 1  . oxybutynin (DITROPAN-XL) 5 MG 24 hr tablet Take 5 mg by mouth daily.    Marland Kitchen oxyCODONE-acetaminophen (ROXICET) 5-325 MG per tablet 1-2 tablets q 4h prn (Patient not taking: Reported on 06/23/2014) 20 tablet 0  . prochlorperazine  (COMPAZINE) 10 MG tablet   1  . simvastatin (ZOCOR) 40 MG tablet TAKE 1 TABLET DAILY 90 tablet 0  . traMADol (ULTRAM) 50 MG tablet      No current facility-administered medications for this visit.    PHYSICAL EXAMINATION: ECOG PERFORMANCE STATUS: 0 - Asymptomatic  Filed Vitals:   06/30/14 1102  BP: 135/93  Pulse: 83  Temp: 97.8 F (36.6 C)  Resp: 19   Filed Weights   06/30/14 1102  Weight: 259 lb 11.2 oz (117.799 kg)    GENERAL:alert, no distress and comfortable SKIN: skin color, texture, turgor are normal, no rashes or significant lesions EYES: normal, Conjunctiva are pink and non-injected, sclera clear OROPHARYNX:no exudate, no erythema and lips, buccal mucosa, and tongue normal  NECK: supple, thyroid normal size, non-tender, without nodularity LYMPH:  no palpable lymphadenopathy in the cervical, axillary or inguinal LUNGS: clear to auscultation and percussion with normal breathing effort HEART: regular rate & rhythm and no murmurs and no lower extremity edema ABDOMEN:abdomen soft, non-tender and normal bowel sounds Musculoskeletal:no cyanosis of digits and no clubbing  NEURO: alert & oriented x 3 with fluent speech, no focal motor/sensory deficits  LABORATORY DATA:  I have reviewed the data as listed   Chemistry      Component Value Date/Time   NA 143 06/23/2014 1347   NA 140 05/31/2011 0905   K 4.0 06/23/2014 1347   K 4.6 05/31/2011 0905   CL 106 05/31/2011 0905   CO2 28 06/23/2014 1347   CO2 24 05/31/2011 0905   BUN 12.2 06/23/2014 1347   BUN 17 05/31/2011 0905   CREATININE 0.8 06/23/2014 1347   CREATININE 0.83 05/31/2011 0905   CREATININE 0.73 12/21/2009 1937      Component Value Date/Time   CALCIUM 9.0 06/23/2014 1347   CALCIUM 9.4 05/31/2011 0905   ALKPHOS 63 06/23/2014 1347   ALKPHOS 52 08/11/2013 0918   AST 26 06/23/2014 1347   AST 19 08/11/2013 0918   ALT 43 06/23/2014 1347   ALT 24 08/11/2013 0918   BILITOT 0.28 06/23/2014 1347   BILITOT 0.4  08/11/2013 0918       Lab Results  Component Value Date   WBC 5.2 06/30/2014   HGB 11.9 06/30/2014   HCT 37.7 06/30/2014   MCV 82.1 06/30/2014   PLT 299 06/30/2014   NEUTROABS 3.3 06/30/2014   ASSESSMENT & PLAN:  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 M0 stage IIB  Current treatment: Completed 4 cycles of dose dense Adriamycin and Cytoxan. Today is week 5/12 Abraxane  Toxicities of chemotherapy: 1. Chemotherapy-induced neutropenia: ANC 3300 today. I reduced the dosage of Abraxane to 85 mg/m from week 3  2. Chemotherapy-induced diarrhea resolved with Imodium 3. Esophageal spasms diminished  4. Fatigue related to chemotherapy 5. Alopecia Monitoring closely for chemotherapy toxicities.  Blood counts were reviewed and she can get treated today even with an [place] of 3300. Return to clinic in 3 weeks for clinic follow-up but weekly for chemotherapy with Abraxane.    Orders Placed This Encounter  Procedures  . CBC with Differential    Standing Status: Future     Number of Occurrences:      Standing Expiration Date: 06/30/2015  . Comprehensive metabolic panel (Cmet) - [facility]    Standing Status: Future     Number of Occurrences:      Standing Expiration Date: 06/30/2015   The patient has a good understanding of the overall plan. she agrees with it. She will call with any problems that may develop before her next visit here.   Rulon Eisenmenger, MD

## 2014-07-03 ENCOUNTER — Telehealth: Payer: Self-pay | Admitting: Family

## 2014-07-03 NOTE — Telephone Encounter (Signed)
Caller name: Eppes,Murray Relation to [id number]  Call back number: [phone]   Reason for call:    Please send meloxicam (MOBIC) 7.5 MG tablet to  [place], [place] [phone] (Phone) [phone] (Fax)

## 2014-07-03 NOTE — Telephone Encounter (Signed)
Last filled:  [facility] [address]; next appt 07/08/14

## 2014-07-07 ENCOUNTER — Ambulatory Visit (HOSPITAL_BASED_OUTPATIENT_CLINIC_OR_DEPARTMENT_OTHER): Payer: BLUE CROSS/BLUE SHIELD

## 2014-07-07 ENCOUNTER — Other Ambulatory Visit (HOSPITAL_BASED_OUTPATIENT_CLINIC_OR_DEPARTMENT_OTHER): Payer: BLUE CROSS/BLUE SHIELD

## 2014-07-07 ENCOUNTER — Ambulatory Visit (HOSPITAL_BASED_OUTPATIENT_CLINIC_OR_DEPARTMENT_OTHER): Payer: BLUE CROSS/BLUE SHIELD | Admitting: Nurse Practitioner

## 2014-07-07 ENCOUNTER — Encounter: Payer: Self-pay | Admitting: Nurse Practitioner

## 2014-07-07 VITALS — BP 135/81 | HR 89 | Temp 98.4°F | Resp 18 | Ht 68.0 in | Wt 259.9 lb

## 2014-07-07 DIAGNOSIS — C50412 Malignant neoplasm of upper-outer quadrant of left female breast: Secondary | ICD-10-CM

## 2014-07-07 DIAGNOSIS — Z5111 Encounter for antineoplastic chemotherapy: Secondary | ICD-10-CM

## 2014-07-07 LAB — COMPREHENSIVE METABOLIC PANEL (CC13)
ALBUMIN: 3.8 g/dL (ref 3.5–5.0)
ALT: 40 U/L (ref 0–55)
AST: 27 U/L (ref 5–34)
Alkaline Phosphatase: 58 U/L (ref 40–150)
Anion Gap: 11 mEq/L (ref 3–11)
BUN: 9.4 mg/dL (ref 7.0–26.0)
CALCIUM: 9.5 mg/dL (ref 8.4–10.4)
CHLORIDE: 108 meq/L (ref 98–109)
CO2: 24 mEq/L (ref 22–29)
Creatinine: 0.8 mg/dL (ref 0.6–1.1)
EGFR: 78 mL/min/{1.73_m2} — ABNORMAL LOW (ref >90)
Glucose: 96 mg/dl (ref 70–140)
Potassium: 4.1 mEq/L (ref 3.5–5.1)
SODIUM: 142 meq/L (ref 136–145)
TOTAL PROTEIN: 6.2 g/dL — AB (ref 6.4–8.3)
Total Bilirubin: 0.32 mg/dL (ref 0.20–1.20)

## 2014-07-07 LAB — CBC WITH DIFFERENTIAL/PLATELET
BASO%: 1 % (ref 0.0–2.0)
Basophils Absolute: 0 10*3/uL (ref 0.0–0.1)
EOS%: 4.4 % (ref 0.0–7.0)
Eosinophils Absolute: 0.2 10*3/uL (ref 0.0–0.5)
HCT: 37.4 % (ref 34.8–46.6)
HGB: 11.7 g/dL (ref 11.6–15.9)
LYMPH%: 27.3 % (ref 14.0–49.7)
MCH: 25.8 pg (ref 25.1–34.0)
MCHC: 31.3 g/dL — AB (ref 31.5–36.0)
MCV: 82.4 fL (ref 79.5–101.0)
MONO#: 0.3 10*3/uL (ref 0.1–0.9)
MONO%: 8.3 % (ref 0.0–14.0)
NEUT%: 59 % (ref 38.4–76.8)
NEUTROS ABS: 2.4 10*3/uL (ref 1.5–6.5)
Platelets: 323 10*3/uL (ref 145–400)
RBC: 4.54 10*6/uL (ref 3.70–5.45)
RDW: 22.3 % — AB (ref 11.2–14.5)
WBC: 4 10*3/uL (ref 3.9–10.3)
lymph#: 1.1 10*3/uL (ref 0.9–3.3)

## 2014-07-07 MED ORDER — SODIUM CHLORIDE 0.9 % IV SOLN
Freq: Once | INTRAVENOUS | Status: AC
  Administered 2014-07-07: 15:00:00 via INTRAVENOUS

## 2014-07-07 MED ORDER — ONDANSETRON 8 MG/NS 50 ML IVPB
INTRAVENOUS | Status: AC
  Filled 2014-07-07: qty 8

## 2014-07-07 MED ORDER — SODIUM CHLORIDE 0.9 % IJ SOLN
10.0000 mL | INTRAMUSCULAR | Status: DC | PRN
  Administered 2014-07-07: 10 mL
  Filled 2014-07-07: qty 10

## 2014-07-07 MED ORDER — HEPARIN SOD (PORK) LOCK FLUSH 100 UNIT/ML IV SOLN
500.0000 [IU] | Freq: Once | INTRAVENOUS | Status: AC | PRN
  Administered 2014-07-07: 500 [IU]
  Filled 2014-07-07: qty 5

## 2014-07-07 MED ORDER — PACLITAXEL PROTEIN-BOUND CHEMO INJECTION 100 MG
85.0000 mg/m2 | Freq: Once | INTRAVENOUS | Status: AC
  Administered 2014-07-07: 200 mg via INTRAVENOUS
  Filled 2014-07-07: qty 40

## 2014-07-07 MED ORDER — ONDANSETRON 8 MG/50ML IVPB (CHCC)
8.0000 mg | Freq: Once | INTRAVENOUS | Status: AC
  Administered 2014-07-07: 8 mg via INTRAVENOUS

## 2014-07-07 NOTE — Patient Instructions (Signed)
[facility] Discharge Instructions for Patients Receiving Chemotherapy  Today you received the following chemotherapy agents Abraxane  To help prevent nausea and vomiting after your treatment, we encourage you to take your nausea medication as prescribed.   If you develop nausea and vomiting that is not controlled by your nausea medication, call the clinic.   BELOW ARE SYMPTOMS THAT SHOULD BE REPORTED IMMEDIATELY:  *FEVER GREATER THAN 100.5 F  *CHILLS WITH OR WITHOUT FEVER  NAUSEA AND VOMITING THAT IS NOT CONTROLLED WITH YOUR NAUSEA MEDICATION  *UNUSUAL SHORTNESS OF BREATH  *UNUSUAL BRUISING OR BLEEDING  TENDERNESS IN MOUTH AND THROAT WITH OR WITHOUT PRESENCE OF ULCERS  *URINARY PROBLEMS  *BOWEL PROBLEMS  UNUSUAL RASH Items with * indicate a potential emergency and should be followed up as soon as possible.  Feel free to call the clinic you have any questions or concerns. The clinic phone number is [phone].    

## 2014-07-07 NOTE — Assessment & Plan Note (Signed)
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 M0 stage IIB  Current treatment: Completed 4 cycles of dose dense Adriamycin and Cytoxan. Today is week 6/12 Abraxane  "Michelle Kennedy" is tolerating chemotherapy exceptionally well. The labs were reviewed in detail and were entirely stable. Her ANC is 2.4. She will proceed with cycle 6 of abraxane as planned.   The patient will return next week for labs, a follow up visit, and cycle 7 of treatment.

## 2014-07-07 NOTE — Progress Notes (Signed)
Patient Care Team: Debbrah Alar, NP as PCP - General (Internal Medicine) Excell Seltzer, MD as Consulting Physician (General Surgery) Rulon Eisenmenger, MD as Consulting Physician (Hematology and Oncology) [name], MD as Attending Physician (Radiation Oncology)  DIAGNOSIS: Breast cancer of upper-outer quadrant of left female breast   Staging form: Breast, AJCC 7th Edition     Clinical: Stage IIB (T3, N0, cM0) - Unsigned       Staging comments: Staged at breast conference 12/31/13.      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 with lymphovascular invasion margins negative one out of 4 SLN positive, ER 99%, PR 100%, HER-2 negative ratio 0.74, T2, N1, M0 stage IIB   03/19/2014 PET scan No evidence of distant metastatic disease   03/31/2014 -  Chemotherapy Adjuvant chemotherapy with dose dense Adriamycin Cytoxan x4 followed by Abraxane weekly x12    CHIEF COMPLIANT:  Cycle 6 of Abraxane  INTERVAL HISTORY: Michelle GATCHELL is a  59 year old lady with above-mentioned history of left-sided breast cancer currently on adjuvant chemotherapy. Today is cycle 6 of Abraxane. She continues to tolerate the drug exceptionally well with no complaints to offer this visit. Specifically she denies nausea, vomiting, changes in bowel or bladder habits, or peripheral neuropathy symptoms. Her appetite has remained healthy, but  she has lost the taste of various food items. She sleeps well and fatigue during the day is not an issue.   REVIEW OF SYSTEMS:   Constitutional: Denies fevers, chills or abnormal weight loss Eyes: Denies blurriness of vision Ears, nose, mouth, throat, and face: Denies mucositis or sore throat Respiratory: Denies cough, dyspnea or wheezes Cardiovascular: Denies palpitation, chest discomfort or lower extremity swelling Gastrointestinal:  Denies nausea, heartburn or change in bowel habits Skin: Denies abnormal skin rashes Lymphatics: Denies new lymphadenopathy or easy bruising Neurological:Denies numbness, tingling or new weaknesses Behavioral/Psych: Mood is stable, no new changes  Breast:  denies any pain or lumps or nodules in either breasts All other systems were reviewed with the patient and are negative.  I have reviewed the past medical history, past surgical history, social history and family history with the patient and they are unchanged from previous note.  ALLERGIES:  is allergic to bee venom; contrast media; iodine; latex; penicillins; shellfish allergy; aspirin; erythromycin; lidocaine; symbicort; codeine; compazine; pentazocine; pentazocine lactate; sulfamethoxazole; and sulfonamide derivatives.  MEDICATIONS:  Current Outpatient Prescriptions  Medication Sig Dispense Refill  . Calcium Carbonate (CALCIUM 500 PO) Take 1 tablet by mouth daily.     Marland Kitchen dexamethasone (DECADRON) 4 MG tablet Take 2 tablets by mouth once a day on the day after chemotherapy and then take 2 tablets two times a day for 2 days. Take with food. 30 tablet 1  . DULoxetine (CYMBALTA) 60 MG capsule TAKE 1 CAPSULE DAILY 90 capsule 0  . Ergocalciferol (VITAMIN D2) 400 UNITS TABS Take 1 tablet by mouth daily.    . Fluticasone-Salmeterol (  ADVAIR DISKUS) 250-50 MCG/DOSE AEPB Inhale 1 puff into the lungs 2 (two) times daily. 180 each 1  . levothyroxine (SYNTHROID, LEVOTHROID) 150 MCG tablet Take 1 tablet (150 mcg  total) by mouth daily. 30 tablet 1  . LORazepam (ATIVAN) 0.5 MG tablet Take 1 tablet (0.5 mg total) by mouth every 6 (six) hours as needed (Nausea or vomiting). 30 tablet 0  . meloxicam (MOBIC) 7.5 MG tablet Take 1 tablet (7.5 mg total) by mouth daily. 30 tablet 0  . Multiple Vitamin (MULTIVITAMIN) tablet Take 1 tablet by mouth daily.      Marland Kitchen nystatin (MYCOSTATIN) 100000 UNIT/ML suspension Take 5 mLs (500,000 Units total) by mouth 4 (four) times daily. 240 mL 0  . Omega-3 Fatty Acids (FISH OIL) 1200 MG CAPS Take 1 capsule by mouth daily.      Marland Kitchen omeprazole (PRILOSEC) 40 MG capsule Take 1 capsule (40 mg total) by mouth daily. 90 capsule 1  . ondansetron (ZOFRAN) 8 MG tablet Take 1 tablet (8 mg total) by mouth 2 (two) times daily as needed. Start on the third day after chemotherapy. 30 tablet 1  . oxybutynin (DITROPAN-XL) 5 MG 24 hr tablet Take 5 mg by mouth daily.    . simvastatin (ZOCOR) 40 MG tablet TAKE 1 TABLET DAILY 90 tablet 0  . albuterol (PROVENTIL HFA;VENTOLIN HFA) 108 (90 BASE) MCG/ACT inhaler Inhale 2 puffs into the lungs every 6 (six) hours as needed for wheezing or shortness of breath. Please dispense Proair (Patient not taking: Reported on 07/07/2014) 8.5 g 5  . albuterol (PROVENTIL) (5 MG/ML) 0.5% nebulizer solution Take 2.5 mg by nebulization every 6 (six) hours as needed for wheezing or shortness of breath.    . benzonatate (TESSALON) 200 MG capsule Take 1 capsule (200 mg total) by mouth 3 (three) times daily as needed for cough. (Patient not taking: Reported on 07/07/2014) 30 capsule 1  . diphenhydrAMINE (BENADRYL) 25 MG tablet Take 1 tablet an hour before MRI.  Then take 1 tablet at bedtime as needed, per your usual regimen. (Patient not taking: Reported on 07/07/2014) 30 tablet 0  . EPINEPHrine 0.3 mg/0.3 mL IJ SOAJ injection Inject 0.3 mLs (0.3 mg total) into the muscle once. (Patient not taking: Reported on 07/07/2014) 2 Device 0  . nitroGLYCERIN (NITROSTAT) 0.4 MG SL tablet Place 1  tablet (0.4 mg total) under the tongue every 5 (five) minutes as needed for chest pain. (Patient not taking: Reported on 07/07/2014) 30 tablet 0  . oxyCODONE-acetaminophen (ROXICET) 5-325 MG per tablet 1-2 tablets q 4h prn (Patient not taking: Reported on 07/07/2014) 20 tablet 0  . prochlorperazine (COMPAZINE) 10 MG tablet   1  . traMADol (ULTRAM) 50 MG tablet      No current facility-administered medications for this visit.    PHYSICAL EXAMINATION: ECOG PERFORMANCE STATUS: 0 - Asymptomatic  Filed Vitals:   07/07/14 1311  BP: 135/81  Pulse: 89  Temp: 98.4 F (36.9 C)  Resp: 18   Filed Weights   07/07/14 1311  Weight: 259 lb 14.4 oz (117.89 kg)    Skin: warm, dry  HEENT: sclerae anicteric, conjunctivae pink, oropharynx clear. No thrush or mucositis.  Lymph Nodes: No cervical or supraclavicular lymphadenopathy  Lungs: clear to auscultation bilaterally, no rales, wheezes, or rhonci  Heart: regular rate and rhythm  Abdomen: round, soft, non tender, positive bowel sounds  Musculoskeletal: No focal spinal tenderness, no peripheral edema  Neuro: non focal, well oriented, positive affect  Breast: deferred  LABORATORY DATA:  I have reviewed the data as listed   Chemistry      Component Value Date/Time   NA 142 07/07/2014 1250   NA 140 05/31/2011 0905   K 4.1 07/07/2014 1250   K 4.6 05/31/2011 0905   CL 106 05/31/2011 0905   CO2 24 07/07/2014 1250   CO2 24 05/31/2011 0905   BUN 9.4 07/07/2014 1250   BUN 17 05/31/2011 0905   CREATININE 0.8 07/07/2014 1250   CREATININE 0.83 05/31/2011 0905   CREATININE 0.73 12/21/2009 1937      Component Value Date/Time   CALCIUM 9.5 07/07/2014 1250   CALCIUM 9.4 05/31/2011 0905   ALKPHOS 58 07/07/2014 1250   ALKPHOS 52 08/11/2013 0918   AST 27 07/07/2014 1250   AST 19 08/11/2013 0918   ALT 40 07/07/2014 1250   ALT 24 08/11/2013 0918   BILITOT 0.32 07/07/2014 1250   BILITOT 0.4 08/11/2013 0918       Lab Results  Component Value  Date   WBC 4.0 07/07/2014   HGB 11.7 07/07/2014   HCT 37.4 07/07/2014   MCV 82.4 07/07/2014   PLT 323 07/07/2014   NEUTROABS 2.4 07/07/2014   ASSESSMENT & PLAN:  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 M0 stage IIB  Current treatment: Completed 4 cycles of dose dense Adriamycin and Cytoxan. Today is week 6/12 Abraxane  "[name]" is tolerating chemotherapy exceptionally well. The labs were reviewed in detail and were entirely stable. Her ANC is 2.4. She will proceed with cycle 6 of abraxane as planned.   The patient will return next week for labs, a follow up visit, and cycle 7 of treatment.     Orders Placed This Encounter  Procedures  . CBC with Differential    Standing Status: Future     Number of Occurrences:      Standing Expiration Date: 07/07/2015  . Comprehensive metabolic panel    Standing Status: Future     Number of Occurrences:      Standing Expiration Date: 07/07/2015   The patient has a good understanding of the overall plan. she agrees with it. She will call with any problems that may develop before her next visit here.   Marcelino Duster, NP

## 2014-07-08 ENCOUNTER — Telehealth: Payer: Self-pay | Admitting: *Deleted

## 2014-07-08 ENCOUNTER — Encounter: Payer: Self-pay | Admitting: Family

## 2014-07-08 ENCOUNTER — Ambulatory Visit (INDEPENDENT_AMBULATORY_CARE_PROVIDER_SITE_OTHER): Payer: BLUE CROSS/BLUE SHIELD | Admitting: Family

## 2014-07-08 VITALS — BP 127/70 | HR 79 | Temp 98.2°F | Resp 18 | Ht 67.0 in | Wt 262.4 lb

## 2014-07-08 DIAGNOSIS — K219 Gastro-esophageal reflux disease without esophagitis: Secondary | ICD-10-CM

## 2014-07-08 DIAGNOSIS — E785 Hyperlipidemia, unspecified: Secondary | ICD-10-CM

## 2014-07-08 DIAGNOSIS — C50412 Malignant neoplasm of upper-outer quadrant of left female breast: Secondary | ICD-10-CM

## 2014-07-08 DIAGNOSIS — J4520 Mild intermittent asthma, uncomplicated: Secondary | ICD-10-CM

## 2014-07-08 DIAGNOSIS — E039 Hypothyroidism, unspecified: Secondary | ICD-10-CM

## 2014-07-08 LAB — TSH: TSH: 0.17 u[IU]/mL — ABNORMAL LOW (ref 0.35–4.50)

## 2014-07-08 LAB — LIPID PANEL
Cholesterol: 179 mg/dL (ref 0–200)
HDL: 41.5 mg/dL (ref >39.00)
NONHDL: 137.5
Total CHOL/HDL Ratio: 4
Triglycerides: 236 mg/dL — ABNORMAL HIGH (ref 0.0–149.0)
VLDL: 47.2 mg/dL — ABNORMAL HIGH (ref 0.0–40.0)

## 2014-07-08 LAB — LDL CHOLESTEROL, DIRECT: LDL DIRECT: 102 mg/dL

## 2014-07-08 MED ORDER — MELOXICAM 7.5 MG PO TABS: 7.5000 mg | ORAL_TABLET | Freq: Every day | ORAL | Status: DC

## 2014-07-08 MED ORDER — OXYBUTYNIN CHLORIDE ER 5 MG PO TB24: 5.0000 mg | ORAL_TABLET | Freq: Every day | ORAL | Status: DC

## 2014-07-08 NOTE — Assessment & Plan Note (Signed)
S/p lumpectomy, undergoing chemo- management per oncology. She is managing well considering what she has been through.

## 2014-07-08 NOTE — Telephone Encounter (Signed)
Received order from [facility]; CMN for O2.  Spoke with pt. She states she only used O2 x 1 month and then returned it. States she was diagnosed with esophageal spasms that were causing her symptoms and no longer needed to keep the O2. Rssponse faxed to [phone].

## 2014-07-08 NOTE — Assessment & Plan Note (Signed)
Advised pt to stop PPI, call if GERD symptoms worsen.

## 2014-07-08 NOTE — Telephone Encounter (Signed)
Refill sent at office visit today.

## 2014-07-08 NOTE — Assessment & Plan Note (Signed)
Clinically stable on synthroid, continue same, obtain tsh.

## 2014-07-08 NOTE — Assessment & Plan Note (Signed)
Stable on advair, continue same.

## 2014-07-08 NOTE — Progress Notes (Signed)
Pre visit review using our clinic review tool, if applicable. No additional management support is needed unless otherwise documented below in the visit note. 

## 2014-07-08 NOTE — Progress Notes (Signed)
Subjective:    Patient ID: Michelle Kennedy, female    DOB: 1956-02-16, 59 y.o.   MRN: [id number]  HPI  Michelle Kennedy is a 60 yr old female who presents today for follow up.  1) Breast CA- invasive ductal carcinoma, s/p lumpectomy, now undergoing chemo with Dr. Nicholas Lose.   2) Hypothyroid/ hx of thyroid ca- + compliance with synthroid.   Lab Results  Component Value Date   TSH 0.084* 09/24/2013   3) Hyperlipidemia- on zocor, denies myalgia.   Lab Results  Component Value Date   CHOL 193 08/11/2013   HDL 61 08/11/2013   LDLCALC 104* 08/11/2013   TRIG 138 08/11/2013   CHOLHDL 3.2 08/11/2013   4) Asthma- reports that this has been well controlled except some wheezing on the very cold days.    5) GERD-  She wishes to stop prilosec. Clinically stable. Concerned re: long term side effects.    Review of Systems See HPI  Past Medical History  Diagnosis Date  . GERD (gastroesophageal reflux disease)   . Hyperlipidemia   . History of seizure age 81    as a reaction to Penicillin - no seizures since  . Migraines   . Arthritis     hips and knees  . History of gastric ulcer     as a teenager  . UTI (lower urinary tract infection) 02/17/2014  . Fibromyalgia   . Asthma     daily inhaler, prn inhaler and neb.  Marland Kitchen History of thyroid cancer     s/p thyroidectomy  . Breast cancer 01/2014    left  . Family history of anesthesia complication     twin brother aspirated and died on OR table, per pt.  . Abnormally small mouth   . Dental bridge present     upper front and lower right  . Dental crowns present     x 3  . Hypothyroidism     History   Social History  . Marital Status: Married    Spouse Name: N/A  . Number of Children: N/A  . 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 Smoking status: Never Smoker   . Smokeless tobacco: Never Used  . Alcohol Use: No  . Drug Use: No  . Sexual Activity: Not on file   Other  Topics Concern  . Not on file   Social History Narrative   Regular exercise: yes          Past Surgical History  Procedure Laterality Date  . Cholecystectomy  1990  . Breast surgery  2011    left breast biposy  . Appendectomy  2004  . Tonsillectomy and adenoidectomy  2000  . Abdominal hysterectomy  2004    complete  . Thyroidectomy  2000  . Colonoscopy w/ polypectomy  06/2009  . Tumor excision      from thoracic spine  . Incontinence surgery  2004  . Achilles tendon repair Right   . Orif toe fracture Right     great toe  . Knee arthroscopy Bilateral     x 6 each knee  . Ligament repair Right     thumb/wrist  . Eye surgery Right 2013    exc. warts from underneath eyelid  . Breast lumpectomy with needle localization and axillary sentinel lymph node bx Left 02/23/2014    Procedure: BREAST LUMPECTOMY WITH NEEDLE LOCALIZATION AND AXILLARY SENTINEL LYMPH NODE BIOPSY;  Surgeon: Excell Seltzer, MD;  Location: [facility];  Service: General;  Laterality: Left;  . Portacath placement N/A 03/24/2014    Procedure: INSERTION PORT-A-CATH;  Surgeon: Excell Seltzer, MD;  Location: [facility];  Service: General;  Laterality: N/A;    Family History  Problem Relation Age of Onset  . Alcohol abuse Mother   . Arthritis Mother   . Hypertension Mother   . Bipolar disorder Mother   . Breast cancer Mother 97    unconfirmed  . Lung cancer Mother 20    smoker  . Alcohol abuse Father   . Cancer Father     lung  . Hyperlipidemia Father   . Kidney disease Father   . Diabetes Father   . Arthritis Maternal Grandmother   . Diabetes Paternal Grandmother   . Thyroid cancer Sister 25    type?; currently 36  . Other Sister     ovarian tumor @ 52; TAH/BSO  . Thyroid cancer Brother     dx 3s; currently 44  . Breast cancer Maternal Aunt     dx 2s; deceased 59  . Thyroid cancer Paternal Aunt     All 3 paternal aunts with thyroid ca in 30s/40s  . Lung cancer  Paternal Aunt     2 of 3 paternal aunts with lung cancer    Allergies  Allergen Reactions  . Bee Venom Anaphylaxis  . Contrast Media [Iodinated Diagnostic Agents] Shortness Of Breath  . Iodine Other (See Comments)    CARDIAC ARREST  . Latex Anaphylaxis and Rash  . Penicillins Shortness Of Breath, Rash and Other (See Comments)    SEIZURE  . Shellfish Allergy Shortness Of Breath  . Aspirin Rash and Other (See Comments)    GI UPSET  . Erythromycin Swelling and Rash    SWELLING OF JOINTS  . Lidocaine Swelling    SWELLING OF MOUTH AND THROAT  . Symbicort [Budesonide-Formoterol Fumarate] Other (See Comments)    BURNING OF TONGUE AND LIPS  . Codeine Rash  . Compazine [Prochlorperazine Maleate] Rash    Rash on face,chest, arms, back  . Pentazocine Rash  . Pentazocine Lactate Rash  . Sulfamethoxazole Rash  . Sulfonamide Derivatives Rash    Current Outpatient Prescriptions on File Prior to Visit  Medication Sig Dispense Refill  . albuterol (PROVENTIL HFA;VENTOLIN HFA) 108 (90 BASE) MCG/ACT inhaler Inhale 2 puffs into the lungs every 6 (six) hours as needed for wheezing or shortness of breath. Please dispense Proair 8.5 g 5  . albuterol (PROVENTIL) (5 MG/ML) 0.5% nebulizer solution Take 2.5 mg by nebulization every 6 (six) hours as needed for wheezing or shortness of breath.    . Calcium Carbonate (CALCIUM 500 PO) Take 1 tablet by mouth daily.     Marland Kitchen dexamethasone (DECADRON) 4 MG tablet Take 2 tablets by mouth once a day on the day after chemotherapy and then take 2 tablets two times a day for 2 days. Take with food. 30 tablet 1  . diphenhydrAMINE (BENADRYL) 25 MG tablet Take 1 tablet an hour before MRI.  Then take 1 tablet at bedtime as needed, per your usual regimen. 30 tablet 0  . DULoxetine (CYMBALTA) 60 MG capsule TAKE 1 CAPSULE DAILY 90 capsule 0  . EPINEPHrine 0.3 mg/0.3 mL IJ SOAJ injection Inject 0.3 mLs (0.3 mg total) into the muscle once. 2 Device 0  . Ergocalciferol (VITAMIN  D2) 400 UNITS TABS Take 1 tablet by mouth daily.    . Fluticasone-Salmeterol (  ADVAIR DISKUS) 250-50 MCG/DOSE AEPB Inhale 1 puff into the lungs 2 (two) times daily. 180 each 1  . levothyroxine (SYNTHROID, LEVOTHROID) 150 MCG tablet Take 1 tablet (150 mcg total) by mouth daily. 30 tablet 1  . LORazepam (ATIVAN) 0.5 MG tablet Take 1 tablet (0.5 mg total) by mouth every 6 (six) hours as needed (Nausea or vomiting). 30 tablet 0  . Multiple Vitamin (MULTIVITAMIN) tablet Take 1 tablet by mouth daily.      . nitroGLYCERIN (NITROSTAT) 0.4 MG SL tablet Place 1 tablet (0.4 mg total) under the tongue every 5 (five) minutes as needed for chest pain. 30 tablet 0  . nystatin (MYCOSTATIN) 100000 UNIT/ML suspension Take 5 mLs (500,000 Units total) by mouth 4 (four) times daily. 240 mL 0  . Omega-3 Fatty Acids (FISH OIL) 1200 MG CAPS Take 1 capsule by mouth daily.      Marland Kitchen omeprazole (PRILOSEC) 40 MG capsule Take 1 capsule (40 mg total) by mouth daily. 90 capsule 1  . ondansetron (ZOFRAN) 8 MG tablet Take 1 tablet (8 mg total) by mouth 2 (two) times daily as needed. Start on the third day after chemotherapy. 30 tablet 1  . oxyCODONE-acetaminophen (ROXICET) 5-325 MG per tablet 1-2 tablets q 4h prn 20 tablet 0  . prochlorperazine (COMPAZINE) 10 MG tablet   1  . simvastatin (ZOCOR) 40 MG tablet TAKE 1 TABLET DAILY 90 tablet 0  . traMADol (ULTRAM) 50 MG tablet      No current facility-administered medications on file prior to visit.    BP 127/70 mmHg  Pulse 79  Temp(Src) 98.2 F (36.8 C) (Oral)  Resp 18  Ht 5\' 7"  (1.702 m)  Wt 262 lb 6.4 oz (119.024 kg)  BMI 41.09 kg/m2  SpO2 97%       Objective:   Physical Exam  Constitutional: She is oriented to person, place, and time. She appears well-developed and well-nourished.  Slightly cushingoid appearance.   HENT:  Head: Normocephalic and atraumatic.  Cardiovascular: Normal rate, regular rhythm and normal heart sounds.   No murmur heard. Pulmonary/Chest:  Effort normal and breath sounds normal. No respiratory distress. She has no wheezes.  Neurological: She is alert and oriented to person, place, and time.  Skin: Skin is warm and dry.  Psychiatric: She has a normal mood and affect. Her behavior is normal. Judgment and thought content normal.          Assessment & Plan:

## 2014-07-08 NOTE — Patient Instructions (Addendum)
Please complete lab work prior to leaving.  Please schedule a complete physical some time in the next few months.

## 2014-07-09 ENCOUNTER — Encounter: Payer: Self-pay | Admitting: Family

## 2014-07-14 ENCOUNTER — Other Ambulatory Visit (HOSPITAL_BASED_OUTPATIENT_CLINIC_OR_DEPARTMENT_OTHER): Payer: BLUE CROSS/BLUE SHIELD

## 2014-07-14 ENCOUNTER — Ambulatory Visit (HOSPITAL_BASED_OUTPATIENT_CLINIC_OR_DEPARTMENT_OTHER): Payer: BLUE CROSS/BLUE SHIELD

## 2014-07-14 ENCOUNTER — Ambulatory Visit (HOSPITAL_BASED_OUTPATIENT_CLINIC_OR_DEPARTMENT_OTHER): Payer: BLUE CROSS/BLUE SHIELD | Admitting: Hematology and Oncology

## 2014-07-14 VITALS — BP 145/84 | HR 90 | Temp 97.1°F | Resp 18 | Ht 67.0 in | Wt 260.5 lb

## 2014-07-14 DIAGNOSIS — Z5111 Encounter for antineoplastic chemotherapy: Secondary | ICD-10-CM

## 2014-07-14 DIAGNOSIS — C50412 Malignant neoplasm of upper-outer quadrant of left female breast: Secondary | ICD-10-CM

## 2014-07-14 DIAGNOSIS — Z170 Estrogen receptor positive status [ER+]: Secondary | ICD-10-CM

## 2014-07-14 LAB — COMPREHENSIVE METABOLIC PANEL (CC13)
ALBUMIN: 3.8 g/dL (ref 3.5–5.0)
ALK PHOS: 65 U/L (ref 40–150)
ALT: 48 U/L (ref 0–55)
AST: 32 U/L (ref 5–34)
Anion Gap: 7 mEq/L (ref 3–11)
BUN: 14 mg/dL (ref 7.0–26.0)
CO2: 29 mEq/L (ref 22–29)
Calcium: 9.9 mg/dL (ref 8.4–10.4)
Chloride: 108 mEq/L (ref 98–109)
Creatinine: 0.9 mg/dL (ref 0.6–1.1)
EGFR: 69 mL/min/{1.73_m2} — ABNORMAL LOW (ref >90)
Glucose: 113 mg/dl (ref 70–140)
POTASSIUM: 4.2 meq/L (ref 3.5–5.1)
Sodium: 143 mEq/L (ref 136–145)
Total Bilirubin: 0.34 mg/dL (ref 0.20–1.20)
Total Protein: 6.4 g/dL (ref 6.4–8.3)

## 2014-07-14 LAB — CBC WITH DIFFERENTIAL/PLATELET
BASO%: 1.4 % (ref 0.0–2.0)
Basophils Absolute: 0.1 10*3/uL (ref 0.0–0.1)
EOS%: 2.7 % (ref 0.0–7.0)
Eosinophils Absolute: 0.1 10*3/uL (ref 0.0–0.5)
HCT: 39.4 % (ref 34.8–46.6)
HGB: 12.3 g/dL (ref 11.6–15.9)
LYMPH#: 1.1 10*3/uL (ref 0.9–3.3)
LYMPH%: 27.2 % (ref 14.0–49.7)
MCH: 26.1 pg (ref 25.1–34.0)
MCHC: 31.2 g/dL — AB (ref 31.5–36.0)
MCV: 83.6 fL (ref 79.5–101.0)
MONO#: 0.3 10*3/uL (ref 0.1–0.9)
MONO%: 8.5 % (ref 0.0–14.0)
NEUT#: 2.4 10*3/uL (ref 1.5–6.5)
NEUT%: 60.2 % (ref 38.4–76.8)
PLATELETS: 303 10*3/uL (ref 145–400)
RBC: 4.72 10*6/uL (ref 3.70–5.45)
RDW: 22.2 % — ABNORMAL HIGH (ref 11.2–14.5)
WBC: 4 10*3/uL (ref 3.9–10.3)

## 2014-07-14 MED ORDER — SODIUM CHLORIDE 0.9 % IV SOLN
Freq: Once | INTRAVENOUS | Status: AC
  Administered 2014-07-14: 11:00:00 via INTRAVENOUS

## 2014-07-14 MED ORDER — HEPARIN SOD (PORK) LOCK FLUSH 100 UNIT/ML IV SOLN
500.0000 [IU] | Freq: Once | INTRAVENOUS | Status: AC | PRN
  Administered 2014-07-14: 500 [IU]
  Filled 2014-07-14: qty 5

## 2014-07-14 MED ORDER — PACLITAXEL PROTEIN-BOUND CHEMO INJECTION 100 MG
85.0000 mg/m2 | Freq: Once | INTRAVENOUS | Status: AC
  Administered 2014-07-14: 200 mg via INTRAVENOUS
  Filled 2014-07-14: qty 40

## 2014-07-14 MED ORDER — ONDANSETRON 8 MG/NS 50 ML IVPB
INTRAVENOUS | Status: AC
  Filled 2014-07-14: qty 8

## 2014-07-14 MED ORDER — SODIUM CHLORIDE 0.9 % IJ SOLN
10.0000 mL | INTRAMUSCULAR | Status: DC | PRN
  Administered 2014-07-14: 10 mL
  Filled 2014-07-14: qty 10

## 2014-07-14 MED ORDER — ONDANSETRON 8 MG/50ML IVPB (CHCC)
8.0000 mg | Freq: Once | INTRAVENOUS | Status: AC
  Administered 2014-07-14: 8 mg via INTRAVENOUS

## 2014-07-14 NOTE — Assessment & Plan Note (Addendum)
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 M0 stage IIB  Current treatment: Completed 4 cycles of dose dense Adriamycin and Cytoxan. Today is week 7/12 Abraxane  Toxicities of chemotherapy: 1. Chemotherapy-induced neutropenia: reduced the dosage of Abraxane to 85 mg/m from week 3  2. Chemotherapy-induced diarrhea resolved with Imodium 3. Esophageal spasms diminished  4. Fatigue related to chemotherapy 5. Alopecia Monitoring closely for chemotherapy toxicities. Return to clinic in 2 weeks for follow-up and weekly for chemotherapy

## 2014-07-14 NOTE — Progress Notes (Signed)
Patient Care Team: Debbrah Alar, NP as PCP - General (Internal Medicine) Excell Seltzer, MD as Consulting Physician (General Surgery) Rulon Eisenmenger, MD as Consulting Physician (Hematology and Oncology) [name], MD as Attending Physician (Radiation Oncology)  DIAGNOSIS: Breast cancer of upper-outer quadrant of left female breast   Staging form: Breast, AJCC 7th Edition     Clinical: Stage IIB (T3, N0, cM0) - Unsigned       Staging comments: Staged at breast conference 12/31/13.      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 She is tolerating Abraxane extremely well without any major problems or concerns. Denies any neuropathy nausea vomiting diarrhea constipation.  REVIEW OF SYSTEMS:   Constitutional: Denies fevers, chills or abnormal weight loss Eyes: Denies blurriness of vision Ears, nose, mouth,  throat, and face: Denies mucositis or sore throat Respiratory: Denies cough, dyspnea or wheezes Cardiovascular: Denies palpitation, chest discomfort or lower extremity swelling Gastrointestinal:  Denies nausea, heartburn or change in bowel habits Skin: Denies abnormal skin rashes Lymphatics: Denies new lymphadenopathy or easy bruising Neurological:Denies numbness, tingling or new weaknesses Behavioral/Psych: Mood is stable, no new changes  Breast:  denies any pain or lumps or nodules in either breasts All other systems were reviewed with the patient and are negative.  I have reviewed the past medical history, past surgical history, social history and family history with the patient and they are unchanged from previous note.  ALLERGIES:  is allergic to bee venom; contrast media; iodine; latex; penicillins; shellfish allergy; aspirin; erythromycin; lidocaine; symbicort; codeine; compazine; pentazocine; pentazocine lactate; sulfamethoxazole; and sulfonamide derivatives.  MEDICATIONS:  Current Outpatient Prescriptions  Medication Sig Dispense Refill  . albuterol (PROVENTIL HFA;VENTOLIN HFA) 108 (90 BASE) MCG/ACT inhaler Inhale 2 puffs into the lungs every 6 (six) hours as needed for wheezing or shortness of breath. Please dispense Proair 8.5 g 5  . albuterol (PROVENTIL) (5 MG/ML) 0.5% nebulizer solution Take 2.5 mg by nebulization every 6 (six) hours as needed for wheezing or shortness of breath.    . Calcium Carbonate (CALCIUM 500 PO) Take 1 tablet by mouth daily.     Marland Kitchen dexamethasone (DECADRON) 4 MG tablet Take 2 tablets by mouth once a day on the day after chemotherapy and then take 2 tablets two times a day for 2 days. Take with food. 30 tablet 1  . diphenhydrAMINE (BENADRYL) 25 MG tablet Take 1 tablet an hour before MRI.  Then take  1 tablet at bedtime as needed, per your usual regimen. 30 tablet 0  . DULoxetine (CYMBALTA) 60 MG capsule TAKE 1 CAPSULE DAILY 90 capsule 0  . Ergocalciferol  (VITAMIN D2) 400 UNITS TABS Take 1 tablet by mouth daily.    . Fluticasone-Salmeterol (ADVAIR DISKUS) 250-50 MCG/DOSE AEPB Inhale 1 puff into the lungs 2 (two) times daily. 180 each 1  . levothyroxine (SYNTHROID, LEVOTHROID) 150 MCG tablet Take 1 tablet (150 mcg total) by mouth daily. 30 tablet 1  . LORazepam (ATIVAN) 0.5 MG tablet Take 1 tablet (0.5 mg total) by mouth every 6 (six) hours as needed (Nausea or vomiting). 30 tablet 0  . meloxicam (MOBIC) 7.5 MG tablet Take 1 tablet (7.5 mg total) by mouth daily. 90 tablet 1  . Multiple Vitamin (MULTIVITAMIN) tablet Take 1 tablet by mouth daily.      . nitroGLYCERIN (NITROSTAT) 0.4 MG SL tablet Place 1 tablet (0.4 mg total) under the tongue every 5 (five) minutes as needed for chest pain. 30 tablet 0  . nystatin (MYCOSTATIN) 100000 UNIT/ML suspension Take 5 mLs (500,000 Units total) by mouth 4 (four) times daily. 240 mL 0  . Omega-3 Fatty Acids (FISH OIL) 1200 MG CAPS Take 1 capsule by mouth daily.      Marland Kitchen omeprazole (PRILOSEC) 40 MG capsule Take 1 capsule (40 mg total) by mouth daily. 90 capsule 1  . ondansetron (ZOFRAN) 8 MG tablet Take 1 tablet (8 mg total) by mouth 2 (two) times daily as needed. Start on the third day after chemotherapy. 30 tablet 1  . oxybutynin (DITROPAN-XL) 5 MG 24 hr tablet Take 1 tablet (5 mg total) by mouth daily. 90 tablet 1  . oxyCODONE-acetaminophen (ROXICET) 5-325 MG per tablet 1-2 tablets q 4h prn 20 tablet 0  . prochlorperazine (COMPAZINE) 10 MG tablet   1  . simvastatin (ZOCOR) 40 MG tablet TAKE 1 TABLET DAILY 90 tablet 0  . traMADol (ULTRAM) 50 MG tablet     . EPINEPHrine 0.3 mg/0.3 mL IJ SOAJ injection Inject 0.3 mLs (0.3 mg total) into the muscle once. (Patient not taking: Reported on 07/14/2014) 2 Device 0   No current facility-administered medications for this visit.    PHYSICAL EXAMINATION: ECOG PERFORMANCE STATUS: 0 - Asymptomatic  Filed Vitals:   07/14/14 0937  BP: 145/84  Pulse: 90  Temp: 97.1 F  (36.2 C)  Resp: 18   Filed Weights   07/14/14 0937  Weight: 260 lb 8 oz (118.162 kg)    GENERAL:alert, no distress and comfortable SKIN: skin color, texture, turgor are normal, no rashes or significant lesions EYES: normal, Conjunctiva are pink and non-injected, sclera clear OROPHARYNX:no exudate, no erythema and lips, buccal mucosa, and tongue normal  NECK: supple, thyroid normal size, non-tender, without nodularity LYMPH:  no palpable lymphadenopathy in the cervical, axillary or inguinal LUNGS: clear to auscultation and percussion with normal breathing effort HEART: regular rate & rhythm and no murmurs and no lower extremity edema ABDOMEN:abdomen soft, non-tender and normal bowel sounds Musculoskeletal:no cyanosis of digits and no clubbing  NEURO: alert & oriented x 3 with fluent speech, no focal motor/sensory deficits  LABORATORY DATA:  I have reviewed the data as listed   Chemistry      Component Value Date/Time   NA 143 07/14/2014 0925   NA 140 05/31/2011 0905   K 4.2 07/14/2014 0925   K 4.6 05/31/2011 0905   CL 106 05/31/2011 0905   CO2 29 07/14/2014 0925   CO2 24 05/31/2011  0905   BUN 14.0 07/14/2014 0925   BUN 17 05/31/2011 0905   CREATININE 0.9 07/14/2014 0925   CREATININE 0.83 05/31/2011 0905   CREATININE 0.73 12/21/2009 1937      Component Value Date/Time   CALCIUM 9.9 07/14/2014 0925   CALCIUM 9.4 05/31/2011 0905   ALKPHOS 65 07/14/2014 0925   ALKPHOS 52 08/11/2013 0918   AST 32 07/14/2014 0925   AST 19 08/11/2013 0918   ALT 48 07/14/2014 0925   ALT 24 08/11/2013 0918   BILITOT 0.34 07/14/2014 0925   BILITOT 0.4 08/11/2013 0918       Lab Results  Component Value Date   WBC 4.0 07/14/2014   HGB 12.3 07/14/2014   HCT 39.4 07/14/2014   MCV 83.6 07/14/2014   PLT 303 07/14/2014   NEUTROABS 2.4 07/14/2014    Views of patient ASSESSMENT & PLAN:  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 M0 stage IIB  Current treatment: Completed 4 cycles of dose dense Adriamycin and Cytoxan. Today is week 7/12 Abraxane  Toxicities of chemotherapy: 1. Chemotherapy-induced neutropenia: reduced the dosage of Abraxane to 85 mg/m from week 3  2. Chemotherapy-induced diarrhea resolved with Imodium 3. Esophageal spasms diminished  4. Fatigue related to chemotherapy 5. Alopecia Monitoring closely for chemotherapy toxicities. Return to clinic in 2 weeks for follow-up and weekly for chemotherapy    No orders of the defined types were placed in this encounter.   The patient has a good understanding of the overall plan. she agrees with it. She will call with any problems that may develop before her next visit here.   Gudena, Vinay K, MD    

## 2014-07-14 NOTE — Patient Instructions (Signed)
[facility] Discharge Instructions for Patients Receiving Chemotherapy  Today you received the following chemotherapy agents Abraxane  To help prevent nausea and vomiting after your treatment, we encourage you to take your nausea medication as prescribed.   If you develop nausea and vomiting that is not controlled by your nausea medication, call the clinic.   BELOW ARE SYMPTOMS THAT SHOULD BE REPORTED IMMEDIATELY:  *FEVER GREATER THAN 100.5 F  *CHILLS WITH OR WITHOUT FEVER  NAUSEA AND VOMITING THAT IS NOT CONTROLLED WITH YOUR NAUSEA MEDICATION  *UNUSUAL SHORTNESS OF BREATH  *UNUSUAL BRUISING OR BLEEDING  TENDERNESS IN MOUTH AND THROAT WITH OR WITHOUT PRESENCE OF ULCERS  *URINARY PROBLEMS  *BOWEL PROBLEMS  UNUSUAL RASH Items with * indicate a potential emergency and should be followed up as soon as possible.  Feel free to call the clinic you have any questions or concerns. The clinic phone number is [phone].    

## 2014-07-17 ENCOUNTER — Other Ambulatory Visit: Payer: Self-pay | Admitting: *Deleted

## 2014-07-17 DIAGNOSIS — C50412 Malignant neoplasm of upper-outer quadrant of left female breast: Secondary | ICD-10-CM

## 2014-07-21 ENCOUNTER — Ambulatory Visit (HOSPITAL_BASED_OUTPATIENT_CLINIC_OR_DEPARTMENT_OTHER): Payer: BLUE CROSS/BLUE SHIELD | Admitting: Hematology and Oncology

## 2014-07-21 ENCOUNTER — Other Ambulatory Visit: Payer: BC Managed Care – PPO

## 2014-07-21 ENCOUNTER — Ambulatory Visit (HOSPITAL_BASED_OUTPATIENT_CLINIC_OR_DEPARTMENT_OTHER): Payer: BLUE CROSS/BLUE SHIELD

## 2014-07-21 ENCOUNTER — Ambulatory Visit: Payer: BC Managed Care – PPO | Admitting: Hematology and Oncology

## 2014-07-21 ENCOUNTER — Other Ambulatory Visit (HOSPITAL_BASED_OUTPATIENT_CLINIC_OR_DEPARTMENT_OTHER): Payer: BLUE CROSS/BLUE SHIELD

## 2014-07-21 ENCOUNTER — Telehealth: Payer: Self-pay | Admitting: Hematology and Oncology

## 2014-07-21 VITALS — BP 145/84 | HR 85 | Temp 98.1°F | Resp 18 | Ht 67.0 in | Wt 263.5 lb

## 2014-07-21 DIAGNOSIS — C50412 Malignant neoplasm of upper-outer quadrant of left female breast: Secondary | ICD-10-CM

## 2014-07-21 DIAGNOSIS — Z170 Estrogen receptor positive status [ER+]: Secondary | ICD-10-CM

## 2014-07-21 DIAGNOSIS — Z452 Encounter for adjustment and management of vascular access device: Secondary | ICD-10-CM

## 2014-07-21 DIAGNOSIS — Z5111 Encounter for antineoplastic chemotherapy: Secondary | ICD-10-CM

## 2014-07-21 LAB — CBC WITH DIFFERENTIAL/PLATELET
BASO%: 1.4 % (ref 0.0–2.0)
BASOS ABS: 0.1 10*3/uL (ref 0.0–0.1)
EOS%: 2.5 % (ref 0.0–7.0)
Eosinophils Absolute: 0.1 10*3/uL (ref 0.0–0.5)
HCT: 37.3 % (ref 34.8–46.6)
HEMOGLOBIN: 11.7 g/dL (ref 11.6–15.9)
LYMPH%: 28.4 % (ref 14.0–49.7)
MCH: 26.8 pg (ref 25.1–34.0)
MCHC: 31.5 g/dL (ref 31.5–36.0)
MCV: 84.9 fL (ref 79.5–101.0)
MONO#: 0.3 10*3/uL (ref 0.1–0.9)
MONO%: 9 % (ref 0.0–14.0)
NEUT%: 58.7 % (ref 38.4–76.8)
NEUTROS ABS: 2.2 10*3/uL (ref 1.5–6.5)
PLATELETS: 303 10*3/uL (ref 145–400)
RBC: 4.39 10*6/uL (ref 3.70–5.45)
RDW: 20.2 % — AB (ref 11.2–14.5)
WBC: 3.7 10*3/uL — AB (ref 3.9–10.3)
lymph#: 1.1 10*3/uL (ref 0.9–3.3)

## 2014-07-21 LAB — COMPREHENSIVE METABOLIC PANEL (CC13)
ALT: 46 U/L (ref 0–55)
AST: 31 U/L (ref 5–34)
Albumin: 3.6 g/dL (ref 3.5–5.0)
Alkaline Phosphatase: 59 U/L (ref 40–150)
Anion Gap: 9 mEq/L (ref 3–11)
BILIRUBIN TOTAL: 0.33 mg/dL (ref 0.20–1.20)
BUN: 11.3 mg/dL (ref 7.0–26.0)
CHLORIDE: 107 meq/L (ref 98–109)
CO2: 26 mEq/L (ref 22–29)
CREATININE: 0.8 mg/dL (ref 0.6–1.1)
Calcium: 9.3 mg/dL (ref 8.4–10.4)
EGFR: 79 mL/min/{1.73_m2} — AB (ref >90)
Glucose: 113 mg/dl (ref 70–140)
Potassium: 3.9 mEq/L (ref 3.5–5.1)
Sodium: 143 mEq/L (ref 136–145)
Total Protein: 6 g/dL — ABNORMAL LOW (ref 6.4–8.3)

## 2014-07-21 MED ORDER — ALTEPLASE 2 MG IJ SOLR
2.0000 mg | Freq: Once | INTRAMUSCULAR | Status: AC | PRN
  Administered 2014-07-21: 2 mg
  Filled 2014-07-21: qty 2

## 2014-07-21 MED ORDER — PACLITAXEL PROTEIN-BOUND CHEMO INJECTION 100 MG
85.0000 mg/m2 | Freq: Once | INTRAVENOUS | Status: AC
  Administered 2014-07-21: 200 mg via INTRAVENOUS
  Filled 2014-07-21: qty 40

## 2014-07-21 MED ORDER — HEPARIN SOD (PORK) LOCK FLUSH 100 UNIT/ML IV SOLN
500.0000 [IU] | Freq: Once | INTRAVENOUS | Status: AC | PRN
  Administered 2014-07-21: 500 [IU]
  Filled 2014-07-21: qty 5

## 2014-07-21 MED ORDER — ONDANSETRON 8 MG/NS 50 ML IVPB
INTRAVENOUS | Status: AC
Start: 2014-07-21 — End: 2014-07-21
  Filled 2014-07-21: qty 8

## 2014-07-21 MED ORDER — SODIUM CHLORIDE 0.9 % IV SOLN
Freq: Once | INTRAVENOUS | Status: AC
  Administered 2014-07-21: 11:00:00 via INTRAVENOUS

## 2014-07-21 MED ORDER — SODIUM CHLORIDE 0.9 % IJ SOLN
10.0000 mL | INTRAMUSCULAR | Status: DC | PRN
  Administered 2014-07-21: 10 mL
  Filled 2014-07-21: qty 10

## 2014-07-21 MED ORDER — ONDANSETRON 8 MG/50ML IVPB (CHCC)
8.0000 mg | Freq: Once | INTRAVENOUS | Status: AC
  Administered 2014-07-21: 8 mg via INTRAVENOUS

## 2014-07-21 NOTE — Progress Notes (Signed)
Patient Care Team: Debbrah Alar, NP as PCP - General (Internal Medicine) Excell Seltzer, MD as Consulting Physician (General Surgery) Rulon Eisenmenger, MD as Consulting Physician (Hematology and Oncology) [name], MD as Attending Physician (Radiation Oncology)  DIAGNOSIS: Breast cancer of upper-outer quadrant of left female breast   Staging form: Breast, AJCC 7th Edition     Clinical: Stage IIB (T3, N0, cM0) - Unsigned       Staging comments: Staged at breast conference 12/31/13.      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 Today is week 8 of 12 Abraxane. He is tolerating Abraxane extremely well without any major problems or concerns. Denies any nausea vomiting or any discomfort.  REVIEW OF SYSTEMS:   Constitutional: Denies fevers, chills or abnormal weight loss Eyes: Denies blurriness of vision Ears, nose,  mouth, throat, and face: Denies mucositis or sore throat Respiratory: Denies cough, dyspnea or wheezes Cardiovascular: Denies palpitation, chest discomfort or lower extremity swelling Gastrointestinal:  Denies nausea, heartburn or change in bowel habits Skin: Denies abnormal skin rashes Lymphatics: Denies new lymphadenopathy or easy bruising Neurological:Denies numbness, tingling or new weaknesses Behavioral/Psych: Mood is stable, no new changes  Breast:  denies any pain or lumps or nodules in either breasts All other systems were reviewed with the patient and are negative.  I have reviewed the past medical history, past surgical history, social history and family history with the patient and they are unchanged from previous note.  ALLERGIES:  is allergic to bee venom; contrast media; iodine; latex; penicillins; shellfish allergy; aspirin; erythromycin; lidocaine; symbicort; codeine; compazine; pentazocine; pentazocine lactate; sulfamethoxazole; and sulfonamide derivatives.  MEDICATIONS:  Current Outpatient Prescriptions  Medication Sig Dispense Refill  . albuterol (PROVENTIL HFA;VENTOLIN HFA) 108 (90 BASE) MCG/ACT inhaler Inhale 2 puffs into the lungs every 6 (six) hours as needed for wheezing or shortness of breath. Please dispense Proair 8.5 g 5  . albuterol (PROVENTIL) (5 MG/ML) 0.5% nebulizer solution Take 2.5 mg by nebulization every 6 (six) hours as needed for wheezing or shortness of breath.    . Calcium Carbonate (CALCIUM 500 PO) Take 1 tablet by mouth daily.     Marland Kitchen dexamethasone (DECADRON) 4 MG tablet Take 2 tablets by mouth once a day on the day after chemotherapy and then take 2 tablets two times a day for 2 days. Take with food. 30 tablet 1  . diphenhydrAMINE (BENADRYL) 25 MG tablet Take 1 tablet an hour  before MRI.  Then take 1 tablet at bedtime as needed, per your usual regimen. 30 tablet 0  . DULoxetine (CYMBALTA) 60 MG capsule TAKE 1 CAPSULE DAILY 90 capsule 0  . EPINEPHrine  0.3 mg/0.3 mL IJ SOAJ injection Inject 0.3 mLs (0.3 mg total) into the muscle once. 2 Device 0  . Ergocalciferol (VITAMIN D2) 400 UNITS TABS Take 1 tablet by mouth daily.    . Fluticasone-Salmeterol (ADVAIR DISKUS) 250-50 MCG/DOSE AEPB Inhale 1 puff into the lungs 2 (two) times daily. 180 each 1  . levothyroxine (SYNTHROID, LEVOTHROID) 150 MCG tablet Take 1 tablet (150 mcg total) by mouth daily. 30 tablet 1  . LORazepam (ATIVAN) 0.5 MG tablet Take 1 tablet (0.5 mg total) by mouth every 6 (six) hours as needed (Nausea or vomiting). 30 tablet 0  . meloxicam (MOBIC) 7.5 MG tablet Take 1 tablet (7.5 mg total) by mouth daily. 90 tablet 1  . Multiple Vitamin (MULTIVITAMIN) tablet Take 1 tablet by mouth daily.      . nitroGLYCERIN (NITROSTAT) 0.4 MG SL tablet Place 1 tablet (0.4 mg total) under the tongue every 5 (five) minutes as needed for chest pain. 30 tablet 0  . nystatin (MYCOSTATIN) 100000 UNIT/ML suspension Take 5 mLs (500,000 Units total) by mouth 4 (four) times daily. 240 mL 0  . Omega-3 Fatty Acids (FISH OIL) 1200 MG CAPS Take 1 capsule by mouth daily.      Marland Kitchen omeprazole (PRILOSEC) 40 MG capsule Take 1 capsule (40 mg total) by mouth daily. 90 capsule 1  . ondansetron (ZOFRAN) 8 MG tablet Take 1 tablet (8 mg total) by mouth 2 (two) times daily as needed. Start on the third day after chemotherapy. 30 tablet 1  . oxybutynin (DITROPAN-XL) 5 MG 24 hr tablet Take 1 tablet (5 mg total) by mouth daily. 90 tablet 1  . oxyCODONE-acetaminophen (ROXICET) 5-325 MG per tablet 1-2 tablets q 4h prn 20 tablet 0  . prochlorperazine (COMPAZINE) 10 MG tablet   1  . simvastatin (ZOCOR) 40 MG tablet TAKE 1 TABLET DAILY 90 tablet 0  . traMADol (ULTRAM) 50 MG tablet      No current facility-administered medications for this visit.    PHYSICAL EXAMINATION: ECOG PERFORMANCE STATUS: 1 - Symptomatic but completely ambulatory  Filed Vitals:   07/21/14 0906  BP: 145/84  Pulse: 85  Temp: 98.1 F (36.7 C)  Resp: 18    Filed Weights   07/21/14 0906  Weight: 263 lb 8 oz (119.523 kg)    GENERAL:alert, no distress and comfortable SKIN: skin color, texture, turgor are normal, no rashes or significant lesions EYES: normal, Conjunctiva are pink and non-injected, sclera clear OROPHARYNX:no exudate, no erythema and lips, buccal mucosa, and tongue normal  NECK: supple, thyroid normal size, non-tender, without nodularity LYMPH:  no palpable lymphadenopathy in the cervical, axillary or inguinal LUNGS: clear to auscultation and percussion with normal breathing effort HEART: regular rate & rhythm and no murmurs and no lower extremity edema ABDOMEN:abdomen soft, non-tender and normal bowel sounds Musculoskeletal:no cyanosis of digits and no clubbing  NEURO: alert & oriented x 3 with fluent speech, no focal motor/sensory deficits  LABORATORY DATA:  I have reviewed the data as listed   Chemistry      Component Value Date/Time   NA 143 07/21/2014 0855   NA 140 05/31/2011 0905   K 3.9 07/21/2014 0855   K 4.6 05/31/2011 0905   CL 106 05/31/2011 0905   CO2 26 07/21/2014 0855   CO2  24 05/31/2011 0905   BUN 11.3 07/21/2014 0855   BUN 17 05/31/2011 0905   CREATININE 0.8 07/21/2014 0855   CREATININE 0.83 05/31/2011 0905   CREATININE 0.73 12/21/2009 1937      Component Value Date/Time   CALCIUM 9.3 07/21/2014 0855   CALCIUM 9.4 05/31/2011 0905   ALKPHOS 59 07/21/2014 0855   ALKPHOS 52 08/11/2013 0918   AST 31 07/21/2014 0855   AST 19 08/11/2013 0918   ALT 46 07/21/2014 0855   ALT 24 08/11/2013 0918   BILITOT 0.33 07/21/2014 0855   BILITOT 0.4 08/11/2013 0918       Lab Results  Component Value Date   WBC 3.7* 07/21/2014   HGB 11.7 07/21/2014   HCT 37.3 07/21/2014   MCV 84.9 07/21/2014   PLT 303 07/21/2014   NEUTROABS 2.2 07/21/2014   ASSESSMENT & PLAN:  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 M0 stage IIB  Current treatment: Completed 4 cycles of dose dense Adriamycin and Cytoxan. Today is week 8/12 Abraxane  Toxicities of chemotherapy: 1. Chemotherapy-induced neutropenia: reduced the dosage of Abraxane to 85 mg/m from week 3  2. Chemotherapy-induced diarrhea resolved with Imodium 3. Esophageal spasms diminished  4. Fatigue related to chemotherapy 5. Alopecia Monitoring closely for chemotherapy toxicities. Return to clinic in 2 weeks for follow-up and weekly for chemotherapy    No orders of the defined types were placed in this encounter.   The patient has a good understanding of the overall plan. she agrees with it. She will call with any problems that may develop before her next visit here.   Rulon Eisenmenger, MD   In the interim

## 2014-07-21 NOTE — Telephone Encounter (Signed)
appts cx per pof and pt will get a avs at end of tx today  anne

## 2014-07-21 NOTE — Assessment & Plan Note (Addendum)
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 M0 stage IIB  Current treatment: Completed 4 cycles of dose dense Adriamycin and Cytoxan. Today is week 8/12 Abraxane  Toxicities of chemotherapy: 1. Chemotherapy-induced neutropenia: reduced the dosage of Abraxane to 85 mg/m from week 3  2. Chemotherapy-induced diarrhea resolved with Imodium 3. Esophageal spasms diminished  4. Fatigue related to chemotherapy 5. Alopecia Monitoring closely for chemotherapy toxicities. Return to clinic in 2 weeks for follow-up and weekly for chemotherapy

## 2014-07-21 NOTE — Patient Instructions (Signed)
[facility] Discharge Instructions for Patients Receiving Chemotherapy  Today you received the following chemotherapy agents abraxane   To help prevent nausea and vomiting after your treatment, we encourage you to take your nausea medication if needed.   If you develop nausea and vomiting that is not controlled by your nausea medication, call the clinic.   BELOW ARE SYMPTOMS THAT SHOULD BE REPORTED IMMEDIATELY:  *FEVER GREATER THAN 100.5 F  *CHILLS WITH OR WITHOUT FEVER  NAUSEA AND VOMITING THAT IS NOT CONTROLLED WITH YOUR NAUSEA MEDICATION  *UNUSUAL SHORTNESS OF BREATH  *UNUSUAL BRUISING OR BLEEDING  TENDERNESS IN MOUTH AND THROAT WITH OR WITHOUT PRESENCE OF ULCERS  *URINARY PROBLEMS  *BOWEL PROBLEMS  UNUSUAL RASH Items with * indicate a potential emergency and should be followed up as soon as possible.  Feel free to call the clinic you have any questions or concerns. The clinic phone number is [phone].

## 2014-07-24 NOTE — Telephone Encounter (Signed)
Mailed letter to pt

## 2014-07-24 NOTE — Telephone Encounter (Signed)
pls contact pt re: unread [employer].

## 2014-07-27 ENCOUNTER — Other Ambulatory Visit: Payer: Self-pay

## 2014-07-27 ENCOUNTER — Other Ambulatory Visit: Payer: Self-pay | Admitting: Family

## 2014-07-27 DIAGNOSIS — C50412 Malignant neoplasm of upper-outer quadrant of left female breast: Secondary | ICD-10-CM

## 2014-07-28 ENCOUNTER — Other Ambulatory Visit: Payer: BLUE CROSS/BLUE SHIELD

## 2014-07-28 ENCOUNTER — Ambulatory Visit: Payer: BC Managed Care – PPO | Admitting: Hematology and Oncology

## 2014-07-28 ENCOUNTER — Ambulatory Visit (HOSPITAL_BASED_OUTPATIENT_CLINIC_OR_DEPARTMENT_OTHER): Payer: BLUE CROSS/BLUE SHIELD

## 2014-07-28 ENCOUNTER — Other Ambulatory Visit (HOSPITAL_BASED_OUTPATIENT_CLINIC_OR_DEPARTMENT_OTHER): Payer: BLUE CROSS/BLUE SHIELD

## 2014-07-28 ENCOUNTER — Other Ambulatory Visit: Payer: BC Managed Care – PPO

## 2014-07-28 DIAGNOSIS — C50412 Malignant neoplasm of upper-outer quadrant of left female breast: Secondary | ICD-10-CM

## 2014-07-28 DIAGNOSIS — Z5111 Encounter for antineoplastic chemotherapy: Secondary | ICD-10-CM

## 2014-07-28 LAB — COMPREHENSIVE METABOLIC PANEL (CC13)
ALT: 46 U/L (ref 0–55)
AST: 29 U/L (ref 5–34)
Albumin: 3.6 g/dL (ref 3.5–5.0)
Alkaline Phosphatase: 58 U/L (ref 40–150)
Anion Gap: 10 meq/L (ref 3–11)
BUN: 11.3 mg/dL (ref 7.0–26.0)
CO2: 25 meq/L (ref 22–29)
Calcium: 9.6 mg/dL (ref 8.4–10.4)
Chloride: 106 meq/L (ref 98–109)
Creatinine: 0.8 mg/dL (ref 0.6–1.1)
EGFR: 79 ml/min/1.73 m2 — ABNORMAL LOW
Glucose: 108 mg/dL (ref 70–140)
Potassium: 4.2 meq/L (ref 3.5–5.1)
Sodium: 140 meq/L (ref 136–145)
Total Bilirubin: 0.46 mg/dL (ref 0.20–1.20)
Total Protein: 6 g/dL — ABNORMAL LOW (ref 6.4–8.3)

## 2014-07-28 LAB — CBC WITH DIFFERENTIAL/PLATELET
BASO%: 0.2 % (ref 0.0–2.0)
BASOS ABS: 0 10*3/uL (ref 0.0–0.1)
EOS ABS: 0.1 10*3/uL (ref 0.0–0.5)
EOS%: 2 % (ref 0.0–7.0)
HCT: 38.4 % (ref 34.8–46.6)
HEMOGLOBIN: 12.3 g/dL (ref 11.6–15.9)
LYMPH%: 30.4 % (ref 14.0–49.7)
MCH: 27.5 pg (ref 25.1–34.0)
MCHC: 32 g/dL (ref 31.5–36.0)
MCV: 85.9 fL (ref 79.5–101.0)
MONO#: 0.4 10*3/uL (ref 0.1–0.9)
MONO%: 8.7 % (ref 0.0–14.0)
NEUT#: 2.4 10*3/uL (ref 1.5–6.5)
NEUT%: 58.7 % (ref 38.4–76.8)
Platelets: 272 10*3/uL (ref 145–400)
RBC: 4.47 10*6/uL (ref 3.70–5.45)
RDW: 18.3 % — ABNORMAL HIGH (ref 11.2–14.5)
WBC: 4 10*3/uL (ref 3.9–10.3)
lymph#: 1.2 10*3/uL (ref 0.9–3.3)

## 2014-07-28 MED ORDER — PACLITAXEL PROTEIN-BOUND CHEMO INJECTION 100 MG
85.0000 mg/m2 | Freq: Once | INTRAVENOUS | Status: AC
  Administered 2014-07-28: 200 mg via INTRAVENOUS
  Filled 2014-07-28: qty 40

## 2014-07-28 MED ORDER — ONDANSETRON 8 MG/50ML IVPB (CHCC): 8.0000 mg | Freq: Once | INTRAVENOUS | Status: DC

## 2014-07-28 MED ORDER — SODIUM CHLORIDE 0.9 % IJ SOLN
10.0000 mL | INTRAMUSCULAR | Status: DC | PRN
  Administered 2014-07-28: 10 mL
  Filled 2014-07-28: qty 10

## 2014-07-28 MED ORDER — HEPARIN SOD (PORK) LOCK FLUSH 100 UNIT/ML IV SOLN
500.0000 [IU] | Freq: Once | INTRAVENOUS | Status: AC | PRN
  Administered 2014-07-28: 500 [IU]
  Filled 2014-07-28: qty 5

## 2014-07-28 MED ORDER — SODIUM CHLORIDE 0.9 % IV SOLN
Freq: Once | INTRAVENOUS | Status: AC
  Administered 2014-07-28: 16:00:00 via INTRAVENOUS
  Filled 2014-07-28: qty 4

## 2014-07-28 MED ORDER — SODIUM CHLORIDE 0.9 % IV SOLN
Freq: Once | INTRAVENOUS | Status: AC
  Administered 2014-07-28: 16:00:00 via INTRAVENOUS

## 2014-07-28 NOTE — Patient Instructions (Signed)
[facility] Discharge Instructions for Patients Receiving Chemotherapy  Today you received the following chemotherapy agents: Abraxane.  To help prevent nausea and vomiting after your treatment, we encourage you to take your nausea medication:  Compazine 10mg  every 6 hours as needed. Zofran 8 mg every 12 hours as needed.   If you develop nausea and vomiting that is not controlled by your nausea medication, call the clinic.   BELOW ARE SYMPTOMS THAT SHOULD BE REPORTED IMMEDIATELY:  *FEVER GREATER THAN 100.5 F  *CHILLS WITH OR WITHOUT FEVER  NAUSEA AND VOMITING THAT IS NOT CONTROLLED WITH YOUR NAUSEA MEDICATION  *UNUSUAL SHORTNESS OF BREATH  *UNUSUAL BRUISING OR BLEEDING  TENDERNESS IN MOUTH AND THROAT WITH OR WITHOUT PRESENCE OF ULCERS  *URINARY PROBLEMS  *BOWEL PROBLEMS  UNUSUAL RASH Items with * indicate a potential emergency and should be followed up as soon as possible.  Feel free to call the clinic you have any questions or concerns. The clinic phone number is [phone].

## 2014-08-04 ENCOUNTER — Other Ambulatory Visit: Payer: BC Managed Care – PPO

## 2014-08-04 ENCOUNTER — Other Ambulatory Visit (HOSPITAL_BASED_OUTPATIENT_CLINIC_OR_DEPARTMENT_OTHER): Payer: BLUE CROSS/BLUE SHIELD

## 2014-08-04 ENCOUNTER — Ambulatory Visit: Payer: BC Managed Care – PPO | Admitting: Hematology and Oncology

## 2014-08-04 ENCOUNTER — Other Ambulatory Visit: Payer: Self-pay | Admitting: *Deleted

## 2014-08-04 ENCOUNTER — Ambulatory Visit (HOSPITAL_BASED_OUTPATIENT_CLINIC_OR_DEPARTMENT_OTHER): Payer: BLUE CROSS/BLUE SHIELD | Admitting: Hematology and Oncology

## 2014-08-04 ENCOUNTER — Telehealth: Payer: Self-pay | Admitting: Hematology and Oncology

## 2014-08-04 ENCOUNTER — Telehealth: Payer: Self-pay | Admitting: *Deleted

## 2014-08-04 ENCOUNTER — Ambulatory Visit (HOSPITAL_BASED_OUTPATIENT_CLINIC_OR_DEPARTMENT_OTHER): Payer: BLUE CROSS/BLUE SHIELD

## 2014-08-04 VITALS — BP 142/92 | HR 99 | Temp 97.9°F | Resp 18 | Ht 67.0 in | Wt 262.8 lb

## 2014-08-04 DIAGNOSIS — C50412 Malignant neoplasm of upper-outer quadrant of left female breast: Secondary | ICD-10-CM

## 2014-08-04 DIAGNOSIS — Z170 Estrogen receptor positive status [ER+]: Secondary | ICD-10-CM

## 2014-08-04 DIAGNOSIS — Z5112 Encounter for antineoplastic immunotherapy: Secondary | ICD-10-CM

## 2014-08-04 LAB — CBC WITH DIFFERENTIAL/PLATELET
BASO%: 0.2 % (ref 0.0–2.0)
Basophils Absolute: 0 10*3/uL (ref 0.0–0.1)
EOS%: 1.4 % (ref 0.0–7.0)
Eosinophils Absolute: 0.1 10*3/uL (ref 0.0–0.5)
HEMATOCRIT: 38.3 % (ref 34.8–46.6)
HGB: 12.1 g/dL (ref 11.6–15.9)
LYMPH%: 22.4 % (ref 14.0–49.7)
MCH: 27.5 pg (ref 25.1–34.0)
MCHC: 31.6 g/dL (ref 31.5–36.0)
MCV: 87 fL (ref 79.5–101.0)
MONO#: 0.4 10*3/uL (ref 0.1–0.9)
MONO%: 9.1 % (ref 0.0–14.0)
NEUT#: 2.9 10*3/uL (ref 1.5–6.5)
NEUT%: 66.9 % (ref 38.4–76.8)
PLATELETS: 251 10*3/uL (ref 145–400)
RBC: 4.4 10*6/uL (ref 3.70–5.45)
RDW: 17.7 % — ABNORMAL HIGH (ref 11.2–14.5)
WBC: 4.4 10*3/uL (ref 3.9–10.3)
lymph#: 1 10*3/uL (ref 0.9–3.3)

## 2014-08-04 LAB — COMPREHENSIVE METABOLIC PANEL (CC13)
ALK PHOS: 73 U/L (ref 40–150)
ALT: 47 U/L (ref 0–55)
AST: 28 U/L (ref 5–34)
Albumin: 3.6 g/dL (ref 3.5–5.0)
Anion Gap: 10 mEq/L (ref 3–11)
BILIRUBIN TOTAL: 0.31 mg/dL (ref 0.20–1.20)
BUN: 10.4 mg/dL (ref 7.0–26.0)
CO2: 25 mEq/L (ref 22–29)
CREATININE: 0.8 mg/dL (ref 0.6–1.1)
Calcium: 9.5 mg/dL (ref 8.4–10.4)
Chloride: 109 mEq/L (ref 98–109)
EGFR: 78 mL/min/{1.73_m2} — ABNORMAL LOW (ref >90)
GLUCOSE: 115 mg/dL (ref 70–140)
Potassium: 4 mEq/L (ref 3.5–5.1)
Sodium: 144 mEq/L (ref 136–145)
Total Protein: 6.1 g/dL — ABNORMAL LOW (ref 6.4–8.3)

## 2014-08-04 MED ORDER — SODIUM CHLORIDE 0.9 % IV SOLN
Freq: Once | INTRAVENOUS | Status: AC
  Administered 2014-08-04: 10:00:00 via INTRAVENOUS

## 2014-08-04 MED ORDER — SODIUM CHLORIDE 0.9 % IV SOLN
Freq: Once | INTRAVENOUS | Status: AC
  Administered 2014-08-04: 10:00:00 via INTRAVENOUS
  Filled 2014-08-04: qty 4

## 2014-08-04 MED ORDER — SODIUM CHLORIDE 0.9 % IJ SOLN
10.0000 mL | INTRAMUSCULAR | Status: DC | PRN
  Administered 2014-08-04: 10 mL
  Filled 2014-08-04: qty 10

## 2014-08-04 MED ORDER — PACLITAXEL PROTEIN-BOUND CHEMO INJECTION 100 MG
85.0000 mg/m2 | Freq: Once | INTRAVENOUS | Status: AC
  Administered 2014-08-04: 200 mg via INTRAVENOUS
  Filled 2014-08-04: qty 40

## 2014-08-04 MED ORDER — HEPARIN SOD (PORK) LOCK FLUSH 100 UNIT/ML IV SOLN
500.0000 [IU] | Freq: Once | INTRAVENOUS | Status: AC | PRN
  Administered 2014-08-04: 500 [IU]
  Filled 2014-08-04: qty 5

## 2014-08-04 NOTE — Telephone Encounter (Signed)
Per staff message and POF I have scheduled appts. Advised scheduler of appts. JMW  

## 2014-08-04 NOTE — Assessment & Plan Note (Signed)
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 M0 stage IIB  Current treatment: Adjuvant chemotherapy Completed 4 cycles of dose dense Adriamycin and Cytoxan. Today is week 10/12 Abraxane  Toxicities of chemotherapy: 1. Chemotherapy-induced neutropenia: reduced the dosage of Abraxane to 85 mg/m from week 3  2. Chemotherapy-induced diarrhea resolved with Imodium 3. Esophageal spasms diminished  4. Fatigue related to chemotherapy 5. Alopecia Monitoring closely for chemotherapy toxicities. Return to clinic in 2 weeks for follow-up and weekly for chemotherapy  Recommendation: 1. After completion of 12 weeks of Abraxane, we will obtain an appointment with radiation oncology for adjuvant radiation therapy. 2. Radiation to be followed by oral antiestrogen therapy

## 2014-08-04 NOTE — Telephone Encounter (Signed)
appts made,email to michelle to add chemo and patient will get an avs while in chemo today

## 2014-08-04 NOTE — Patient Instructions (Signed)
[facility] Discharge Instructions for Patients Receiving Chemotherapy  Today you received the following chemotherapy agents: abraxane  To help prevent nausea and vomiting after your treatment, we encourage you to take your nausea medication.  Take it as often as prescribed.     If you develop nausea and vomiting that is not controlled by your nausea medication, call the clinic. If it is after clinic hours your family physician or the after hours number for the clinic or go to the Emergency Department.   BELOW ARE SYMPTOMS THAT SHOULD BE REPORTED IMMEDIATELY:  *FEVER GREATER THAN 100.5 F  *CHILLS WITH OR WITHOUT FEVER  NAUSEA AND VOMITING THAT IS NOT CONTROLLED WITH YOUR NAUSEA MEDICATION  *UNUSUAL SHORTNESS OF BREATH  *UNUSUAL BRUISING OR BLEEDING  TENDERNESS IN MOUTH AND THROAT WITH OR WITHOUT PRESENCE OF ULCERS  *URINARY PROBLEMS  *BOWEL PROBLEMS  UNUSUAL RASH Items with * indicate a potential emergency and should be followed up as soon as possible.  Feel free to call the clinic you have any questions or concerns. The clinic phone number is [phone].   I have been informed and understand all the instructions given to me. I know to contact the clinic, my physician, or go to the Emergency Department if any problems should occur. I do not have any questions at this time, but understand that I may call the clinic during office hours   should I have any questions or need assistance in obtaining follow up care.    __________________________________________  _____________  __________ Signature of Patient or Authorized Representative            Date                   Time    __________________________________________ Nurse's Signature   

## 2014-08-04 NOTE — Progress Notes (Signed)
Patient Care Team: Debbrah Alar, NP as PCP - General (Internal Medicine) Excell Seltzer, MD as Consulting Physician (General Surgery) Nicholas Lose, MD as Consulting Physician (Hematology and Oncology) [name], MD as Attending Physician (Radiation Oncology)  DIAGNOSIS: Breast cancer of upper-outer quadrant of left female breast   Staging form: Breast, AJCC 7th Edition     Clinical: Stage IIB (T3, N0, cM0) - Unsigned       Staging comments: Staged at breast conference 12/31/13.      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 Today's week 10 of Abraxane. She is tolerating it extremely well. Does not have any significant neuropathy. After the last cycle she felt tingling of the tips of the fingers.  REVIEW OF SYSTEMS:   Constitutional: Denies fevers, chills or abnormal weight  loss Eyes: Denies blurriness of vision Ears, nose, mouth, throat, and face: Denies mucositis or sore throat Respiratory: Denies cough, dyspnea or wheezes Cardiovascular: Denies palpitation, chest discomfort or lower extremity swelling Gastrointestinal:  Denies nausea, heartburn or change in bowel habits Skin: Denies abnormal skin rashes Lymphatics: Denies new lymphadenopathy or easy bruising Neurological:Denies numbness, tingling or new weaknesses Behavioral/Psych: Mood is stable, no new changes  Breast:  denies any pain or lumps or nodules in either breasts All other systems were reviewed with the patient and are negative.  I have reviewed the past medical history, past surgical history, social history and family history with the patient and they are unchanged from previous note.  ALLERGIES:  is allergic to bee venom; contrast media; iodine; latex; penicillins; shellfish allergy; aspirin; erythromycin; lidocaine; symbicort; codeine; compazine; pentazocine; pentazocine lactate; sulfamethoxazole; and sulfonamide derivatives.  MEDICATIONS:  Current Outpatient Prescriptions  Medication Sig Dispense Refill  . albuterol (PROVENTIL HFA;VENTOLIN HFA) 108 (90 BASE) MCG/ACT inhaler Inhale 2 puffs into the lungs every 6 (six) hours as needed for wheezing or shortness of breath. Please dispense Proair 8.5 g 5  . albuterol (PROVENTIL) (5 MG/ML) 0.5% nebulizer solution Take 2.5 mg by nebulization every 6 (six) hours as needed for wheezing or shortness of breath.    . Calcium Carbonate (CALCIUM 500 PO) Take 1 tablet by mouth daily.     Marland Kitchen dexamethasone (DECADRON) 4 MG tablet Take 2 tablets by mouth once a day on the day after chemotherapy and then take 2 tablets two times a day for 2 days. Take with food. 30 tablet 1  . diphenhydrAMINE (BENADRYL)  25 MG tablet Take 1 tablet an hour before MRI.  Then take 1 tablet at bedtime as needed, per your usual regimen. 30 tablet 0  . DULoxetine (CYMBALTA) 60 MG  capsule TAKE 1 CAPSULE DAILY 90 capsule 0  . EPINEPHrine 0.3 mg/0.3 mL IJ SOAJ injection Inject 0.3 mLs (0.3 mg total) into the muscle once. 2 Device 0  . Ergocalciferol (VITAMIN D2) 400 UNITS TABS Take 1 tablet by mouth daily.    . Fluticasone-Salmeterol (ADVAIR DISKUS) 250-50 MCG/DOSE AEPB Inhale 1 puff into the lungs 2 (two) times daily. 180 each 1  . levothyroxine (SYNTHROID, LEVOTHROID) 150 MCG tablet Take 1 tablet (150 mcg total) by mouth daily. 30 tablet 1  . LORazepam (ATIVAN) 0.5 MG tablet Take 1 tablet (0.5 mg total) by mouth every 6 (six) hours as needed (Nausea or vomiting). 30 tablet 0  . meloxicam (MOBIC) 7.5 MG tablet Take 1 tablet (7.5 mg total) by mouth daily. 90 tablet 1  . Multiple Vitamin (MULTIVITAMIN) tablet Take 1 tablet by mouth daily.      . nitroGLYCERIN (NITROSTAT) 0.4 MG SL tablet Place 1 tablet (0.4 mg total) under the tongue every 5 (five) minutes as needed for chest pain. 30 tablet 0  . nystatin (MYCOSTATIN) 100000 UNIT/ML suspension Take 5 mLs (500,000 Units total) by mouth 4 (four) times daily. 240 mL 0  . Omega-3 Fatty Acids (FISH OIL) 1200 MG CAPS Take 1 capsule by mouth daily.      Marland Kitchen omeprazole (PRILOSEC) 40 MG capsule Take 1 capsule (40 mg total) by mouth daily. 90 capsule 1  . ondansetron (ZOFRAN) 8 MG tablet Take 1 tablet (8 mg total) by mouth 2 (two) times daily as needed. Start on the third day after chemotherapy. 30 tablet 1  . oxybutynin (DITROPAN-XL) 5 MG 24 hr tablet Take 1 tablet (5 mg total) by mouth daily. 90 tablet 1  . oxyCODONE-acetaminophen (ROXICET) 5-325 MG per tablet 1-2 tablets q 4h prn 20 tablet 0  . prochlorperazine (COMPAZINE) 10 MG tablet   1  . simvastatin (ZOCOR) 40 MG tablet TAKE 1 TABLET DAILY 90 tablet 1  . traMADol (ULTRAM) 50 MG tablet      No current facility-administered medications for this visit.    PHYSICAL EXAMINATION: ECOG PERFORMANCE STATUS: 0 - Asymptomatic  Filed Vitals:   08/04/14 0903  BP: 142/92  Pulse: 99   Temp: 97.9 F (36.6 C)  Resp: 18   Filed Weights   08/04/14 0903  Weight: 262 lb 12.8 oz (119.205 kg)    GENERAL:alert, no distress and comfortable SKIN: skin color, texture, turgor are normal, no rashes or significant lesions EYES: normal, Conjunctiva are pink and non-injected, sclera clear OROPHARYNX:no exudate, no erythema and lips, buccal mucosa, and tongue normal  NECK: supple, thyroid normal size, non-tender, without nodularity LYMPH:  no palpable lymphadenopathy in the cervical, axillary or inguinal LUNGS: clear to auscultation and percussion with normal breathing effort HEART: regular rate & rhythm and no murmurs and no lower extremity edema ABDOMEN:abdomen soft, non-tender and normal bowel sounds Musculoskeletal:no cyanosis of digits and no clubbing  NEURO: alert & oriented x 3 with fluent speech, no focal motor/sensory deficits  LABORATORY DATA:  I have reviewed the data as listed   Chemistry      Component Value Date/Time   NA 144 08/04/2014 0852   NA 140 05/31/2011 0905   K 4.0 08/04/2014 0852   K 4.6 05/31/2011 0905   CL 106 05/31/2011 0905   CO2 25  08/04/2014 0852   CO2 24 05/31/2011 0905   BUN 10.4 08/04/2014 0852   BUN 17 05/31/2011 0905   CREATININE 0.8 08/04/2014 0852   CREATININE 0.83 05/31/2011 0905   CREATININE 0.73 12/21/2009 1937      Component Value Date/Time   CALCIUM 9.5 08/04/2014 0852   CALCIUM 9.4 05/31/2011 0905   ALKPHOS 73 08/04/2014 0852   ALKPHOS 52 08/11/2013 0918   AST 28 08/04/2014 0852   AST 19 08/11/2013 0918   ALT 47 08/04/2014 0852   ALT 24 08/11/2013 0918   BILITOT 0.31 08/04/2014 0852   BILITOT 0.4 08/11/2013 0918       Lab Results  Component Value Date   WBC 4.4 08/04/2014   HGB 12.1 08/04/2014   HCT 38.3 08/04/2014   MCV 87.0 08/04/2014   PLT 251 08/04/2014   NEUTROABS 2.9 08/04/2014   ASSESSMENT & PLAN:  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 M0 stage IIB  Current treatment: Adjuvant chemotherapy Completed 4 cycles of dose dense Adriamycin and Cytoxan. Today is week 10/12 Abraxane  Toxicities of chemotherapy: 1. Chemotherapy-induced neutropenia: reduced the dosage of Abraxane to 85 mg/m from week 3  2. Chemotherapy-induced diarrhea resolved with Imodium 3. Esophageal spasms diminished  4. Fatigue related to chemotherapy 5. Alopecia Monitoring closely for chemotherapy toxicities. Return to clinic in 2 weeks for follow-up and weekly for chemotherapy  Recommendation: 1. After completion of 12 weeks of Abraxane, we will obtain an appointment with radiation oncology for adjuvant radiation therapy. 2. Radiation to be followed by oral antiestrogen therapy  No orders of the defined types were placed in this encounter.   The patient has a good understanding of the overall plan. she agrees with it. She will call with any problems that may develop before her next visit here.   Rulon Eisenmenger, MD

## 2014-08-11 ENCOUNTER — Ambulatory Visit (HOSPITAL_BASED_OUTPATIENT_CLINIC_OR_DEPARTMENT_OTHER): Payer: BLUE CROSS/BLUE SHIELD

## 2014-08-11 ENCOUNTER — Other Ambulatory Visit: Payer: BC Managed Care – PPO

## 2014-08-11 ENCOUNTER — Other Ambulatory Visit (HOSPITAL_BASED_OUTPATIENT_CLINIC_OR_DEPARTMENT_OTHER): Payer: BLUE CROSS/BLUE SHIELD

## 2014-08-11 ENCOUNTER — Ambulatory Visit: Payer: BC Managed Care – PPO | Admitting: Hematology and Oncology

## 2014-08-11 ENCOUNTER — Ambulatory Visit: Payer: Self-pay | Admitting: Hematology and Oncology

## 2014-08-11 ENCOUNTER — Ambulatory Visit: Payer: BC Managed Care – PPO | Admitting: Nurse Practitioner

## 2014-08-11 ENCOUNTER — Other Ambulatory Visit: Payer: BLUE CROSS/BLUE SHIELD

## 2014-08-11 DIAGNOSIS — C50412 Malignant neoplasm of upper-outer quadrant of left female breast: Secondary | ICD-10-CM

## 2014-08-11 DIAGNOSIS — Z5111 Encounter for antineoplastic chemotherapy: Secondary | ICD-10-CM | POA: Diagnosis not present

## 2014-08-11 DIAGNOSIS — Z452 Encounter for adjustment and management of vascular access device: Secondary | ICD-10-CM

## 2014-08-11 LAB — COMPREHENSIVE METABOLIC PANEL (CC13)
ALK PHOS: 62 U/L (ref 40–150)
ALT: 44 U/L (ref 0–55)
ANION GAP: 9 meq/L (ref 3–11)
AST: 26 U/L (ref 5–34)
Albumin: 3.5 g/dL (ref 3.5–5.0)
BILIRUBIN TOTAL: 0.36 mg/dL (ref 0.20–1.20)
BUN: 9.8 mg/dL (ref 7.0–26.0)
CO2: 27 mEq/L (ref 22–29)
Calcium: 9.5 mg/dL (ref 8.4–10.4)
Chloride: 105 mEq/L (ref 98–109)
Creatinine: 0.8 mg/dL (ref 0.6–1.1)
EGFR: 78 mL/min/{1.73_m2} — AB (ref >90)
GLUCOSE: 117 mg/dL (ref 70–140)
Potassium: 4.5 mEq/L (ref 3.5–5.1)
Sodium: 141 mEq/L (ref 136–145)
Total Protein: 6.1 g/dL — ABNORMAL LOW (ref 6.4–8.3)

## 2014-08-11 LAB — CBC WITH DIFFERENTIAL/PLATELET
BASO%: 0.5 % (ref 0.0–2.0)
Basophils Absolute: 0 10*3/uL (ref 0.0–0.1)
EOS ABS: 0.1 10*3/uL (ref 0.0–0.5)
EOS%: 1.6 % (ref 0.0–7.0)
HCT: 39.2 % (ref 34.8–46.6)
HEMOGLOBIN: 12.3 g/dL (ref 11.6–15.9)
LYMPH%: 25.4 % (ref 14.0–49.7)
MCH: 27.7 pg (ref 25.1–34.0)
MCHC: 31.4 g/dL — ABNORMAL LOW (ref 31.5–36.0)
MCV: 88.3 fL (ref 79.5–101.0)
MONO#: 0.3 10*3/uL (ref 0.1–0.9)
MONO%: 7.8 % (ref 0.0–14.0)
NEUT%: 64.7 % (ref 38.4–76.8)
NEUTROS ABS: 2.8 10*3/uL (ref 1.5–6.5)
PLATELETS: 270 10*3/uL (ref 145–400)
RBC: 4.44 10*6/uL (ref 3.70–5.45)
RDW: 17 % — ABNORMAL HIGH (ref 11.2–14.5)
WBC: 4.4 10*3/uL (ref 3.9–10.3)
lymph#: 1.1 10*3/uL (ref 0.9–3.3)

## 2014-08-11 MED ORDER — SODIUM CHLORIDE 0.9 % IJ SOLN
10.0000 mL | INTRAMUSCULAR | Status: DC | PRN
  Administered 2014-08-11: 10 mL
  Filled 2014-08-11: qty 10

## 2014-08-11 MED ORDER — SODIUM CHLORIDE 0.9 % IV SOLN
Freq: Once | INTRAVENOUS | Status: AC
  Administered 2014-08-11: 15:00:00 via INTRAVENOUS
  Filled 2014-08-11: qty 4

## 2014-08-11 MED ORDER — PACLITAXEL PROTEIN-BOUND CHEMO INJECTION 100 MG
85.0000 mg/m2 | Freq: Once | INTRAVENOUS | Status: AC
  Administered 2014-08-11: 200 mg via INTRAVENOUS
  Filled 2014-08-11: qty 40

## 2014-08-11 MED ORDER — SODIUM CHLORIDE 0.9 % IV SOLN
Freq: Once | INTRAVENOUS | Status: AC
  Administered 2014-08-11: 15:00:00 via INTRAVENOUS

## 2014-08-11 MED ORDER — ALTEPLASE 2 MG IJ SOLR
2.0000 mg | Freq: Once | INTRAMUSCULAR | Status: AC | PRN
  Administered 2014-08-11: 2 mg
  Filled 2014-08-11: qty 2

## 2014-08-11 MED ORDER — HEPARIN SOD (PORK) LOCK FLUSH 100 UNIT/ML IV SOLN
500.0000 [IU] | Freq: Once | INTRAVENOUS | Status: AC | PRN
  Administered 2014-08-11: 500 [IU]
  Filled 2014-08-11: qty 5

## 2014-08-11 NOTE — Progress Notes (Signed)
1245-Port TPA checked.  Port remains with no blood return.  1320-Port checked for blood return. None obtained.  TPA left in.  Will re-check in 30 minutes.  1415-No blood return from port.  TPA removed from port and port deaccessed by Drucie Ip RN

## 2014-08-11 NOTE — Patient Instructions (Signed)
[facility] Discharge Instructions for Patients Receiving Chemotherapy  Today you received the following chemotherapy agents:  Abraxane  To help prevent nausea and vomiting after your treatment, we encourage you to take your nausea medication as ordered per MD.   If you develop nausea and vomiting that is not controlled by your nausea medication, call the clinic.   BELOW ARE SYMPTOMS THAT SHOULD BE REPORTED IMMEDIATELY:  *FEVER GREATER THAN 100.5 F  *CHILLS WITH OR WITHOUT FEVER  NAUSEA AND VOMITING THAT IS NOT CONTROLLED WITH YOUR NAUSEA MEDICATION  *UNUSUAL SHORTNESS OF BREATH  *UNUSUAL BRUISING OR BLEEDING  TENDERNESS IN MOUTH AND THROAT WITH OR WITHOUT PRESENCE OF ULCERS  *URINARY PROBLEMS  *BOWEL PROBLEMS  UNUSUAL RASH Items with * indicate a potential emergency and should be followed up as soon as possible.  Feel free to call the clinic you have any questions or concerns. The clinic phone number is [phone].  Please show the CHEMO ALERT CARD at check-in to the Emergency Department and triage nurse.   

## 2014-08-18 ENCOUNTER — Telehealth: Payer: Self-pay | Admitting: Hematology and Oncology

## 2014-08-18 ENCOUNTER — Other Ambulatory Visit (HOSPITAL_BASED_OUTPATIENT_CLINIC_OR_DEPARTMENT_OTHER): Payer: BLUE CROSS/BLUE SHIELD

## 2014-08-18 ENCOUNTER — Ambulatory Visit (HOSPITAL_BASED_OUTPATIENT_CLINIC_OR_DEPARTMENT_OTHER): Payer: BLUE CROSS/BLUE SHIELD | Admitting: Hematology and Oncology

## 2014-08-18 ENCOUNTER — Encounter: Payer: Self-pay | Admitting: Medical Oncology

## 2014-08-18 ENCOUNTER — Ambulatory Visit (HOSPITAL_BASED_OUTPATIENT_CLINIC_OR_DEPARTMENT_OTHER): Payer: BLUE CROSS/BLUE SHIELD

## 2014-08-18 VITALS — BP 150/84 | HR 86 | Temp 97.6°F | Resp 18 | Ht 67.0 in | Wt 262.9 lb

## 2014-08-18 DIAGNOSIS — D702 Other drug-induced agranulocytosis: Secondary | ICD-10-CM

## 2014-08-18 DIAGNOSIS — R530 Neoplastic (malignant) related fatigue: Secondary | ICD-10-CM

## 2014-08-18 DIAGNOSIS — C50412 Malignant neoplasm of upper-outer quadrant of left female breast: Secondary | ICD-10-CM | POA: Diagnosis not present

## 2014-08-18 DIAGNOSIS — R197 Diarrhea, unspecified: Secondary | ICD-10-CM

## 2014-08-18 DIAGNOSIS — Z5111 Encounter for antineoplastic chemotherapy: Secondary | ICD-10-CM | POA: Diagnosis not present

## 2014-08-18 LAB — CBC WITH DIFFERENTIAL/PLATELET
BASO%: 1 % (ref 0.0–2.0)
Basophils Absolute: 0 10*3/uL (ref 0.0–0.1)
EOS ABS: 0.1 10*3/uL (ref 0.0–0.5)
EOS%: 2.2 % (ref 0.0–7.0)
HCT: 38 % (ref 34.8–46.6)
HGB: 11.9 g/dL (ref 11.6–15.9)
LYMPH%: 29.2 % (ref 14.0–49.7)
MCH: 27.2 pg (ref 25.1–34.0)
MCHC: 31.3 g/dL — ABNORMAL LOW (ref 31.5–36.0)
MCV: 86.7 fL (ref 79.5–101.0)
MONO#: 0.3 10*3/uL (ref 0.1–0.9)
MONO%: 8.2 % (ref 0.0–14.0)
NEUT%: 59.4 % (ref 38.4–76.8)
NEUTROS ABS: 2.5 10*3/uL (ref 1.5–6.5)
PLATELETS: 325 10*3/uL (ref 145–400)
RBC: 4.38 10*6/uL (ref 3.70–5.45)
RDW: 17.2 % — ABNORMAL HIGH (ref 11.2–14.5)
WBC: 4.1 10*3/uL (ref 3.9–10.3)
lymph#: 1.2 10*3/uL (ref 0.9–3.3)

## 2014-08-18 LAB — COMPREHENSIVE METABOLIC PANEL (CC13)
ALK PHOS: 69 U/L (ref 40–150)
ALT: 48 U/L (ref 0–55)
ANION GAP: 7 meq/L (ref 3–11)
AST: 35 U/L — AB (ref 5–34)
Albumin: 3.5 g/dL (ref 3.5–5.0)
BILIRUBIN TOTAL: 0.35 mg/dL (ref 0.20–1.20)
BUN: 7.8 mg/dL (ref 7.0–26.0)
CO2: 28 mEq/L (ref 22–29)
Calcium: 9.5 mg/dL (ref 8.4–10.4)
Chloride: 108 mEq/L (ref 98–109)
Creatinine: 0.8 mg/dL (ref 0.6–1.1)
EGFR: 85 mL/min/{1.73_m2} — ABNORMAL LOW (ref >90)
Glucose: 108 mg/dl (ref 70–140)
Potassium: 4.5 mEq/L (ref 3.5–5.1)
SODIUM: 143 meq/L (ref 136–145)
Total Protein: 6 g/dL — ABNORMAL LOW (ref 6.4–8.3)

## 2014-08-18 MED ORDER — SODIUM CHLORIDE 0.9 % IV SOLN
Freq: Once | INTRAVENOUS | Status: AC
  Administered 2014-08-18: 13:00:00 via INTRAVENOUS
  Filled 2014-08-18: qty 4

## 2014-08-18 MED ORDER — SODIUM CHLORIDE 0.9 % IJ SOLN
10.0000 mL | INTRAMUSCULAR | Status: DC | PRN
  Administered 2014-08-18: 10 mL
  Filled 2014-08-18: qty 10

## 2014-08-18 MED ORDER — HEPARIN SOD (PORK) LOCK FLUSH 100 UNIT/ML IV SOLN
500.0000 [IU] | Freq: Once | INTRAVENOUS | Status: AC | PRN
  Administered 2014-08-18: 500 [IU]
  Filled 2014-08-18: qty 5

## 2014-08-18 MED ORDER — PACLITAXEL PROTEIN-BOUND CHEMO INJECTION 100 MG
85.0000 mg/m2 | Freq: Once | INTRAVENOUS | Status: AC
  Administered 2014-08-18: 200 mg via INTRAVENOUS
  Filled 2014-08-18: qty 40

## 2014-08-18 MED ORDER — SODIUM CHLORIDE 0.9 % IV SOLN
Freq: Once | INTRAVENOUS | Status: AC
  Administered 2014-08-18: 13:00:00 via INTRAVENOUS

## 2014-08-18 NOTE — Patient Instructions (Signed)
[facility] Discharge Instructions for Patients Receiving Chemotherapy  Today you received the following chemotherapy agents abraxane.   To help prevent nausea and vomiting after your treatment, we encourage you to take your nausea medication as directed.   If you develop nausea and vomiting that is not controlled by your nausea medication, call the clinic.   BELOW ARE SYMPTOMS THAT SHOULD BE REPORTED IMMEDIATELY:  *FEVER GREATER THAN 100.5 F  *CHILLS WITH OR WITHOUT FEVER  NAUSEA AND VOMITING THAT IS NOT CONTROLLED WITH YOUR NAUSEA MEDICATION  *UNUSUAL SHORTNESS OF BREATH  *UNUSUAL BRUISING OR BLEEDING  TENDERNESS IN MOUTH AND THROAT WITH OR WITHOUT PRESENCE OF ULCERS  *URINARY PROBLEMS  *BOWEL PROBLEMS  UNUSUAL RASH Items with * indicate a potential emergency and should be followed up as soon as possible.  Feel free to call the clinic you have any questions or concerns. The clinic phone number is [phone].  

## 2014-08-18 NOTE — Assessment & Plan Note (Signed)
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 M0 stage IIB  Current treatment: Adjuvant chemotherapy Completed 4 cycles of dose dense Adriamycin and Cytoxan. Today is week 12/12 Abraxane (last cycle)  Toxicities of chemotherapy: 1. Chemotherapy-induced neutropenia: reduced the dosage of Abraxane to 85 mg/m from week 3  2. Chemotherapy-induced diarrhea resolved with Imodium 3. Esophageal spasms diminished  4. Fatigue related to chemotherapy 5. Alopecia Recommendation: 1. After completion of 12 weeks of Abraxane, we will obtain an appointment with radiation oncology for adjuvant radiation therapy. 2. Radiation to be followed by oral antiestrogen therapy  [name] Study: I recommended that she participate in clinical trial evaluating the combination of Palbociclib with antiestrogen therapy. She will return back to see me after radiation is complete and decide whether she would like to participate in the clinical trial. I provided her with a copy of the consent form.

## 2014-08-18 NOTE — Progress Notes (Signed)
Patient Care Team: Debbrah Alar, NP as PCP - General (Internal Medicine) Excell Seltzer, MD as Consulting Physician (General Surgery) Nicholas Lose, MD as Consulting Physician (Hematology and Oncology) [name], MD as Attending Physician (Radiation Oncology)  DIAGNOSIS: Breast cancer of upper-outer quadrant of left female breast   Staging form: Breast, AJCC 7th Edition     Clinical: Stage IIB (T3, N0, cM0) - Unsigned       Staging comments: Staged at breast conference 12/31/13.      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 Today the last cycle of treatment. She did very well from treatment standpoint and has had very minimal toxicities recently. Denies any nausea vomiting. Denies any diarrhea or constipation.  REVIEW OF SYSTEMS:   Constitutional: Denies fevers, chills or  abnormal weight loss Eyes: Denies blurriness of vision Ears, nose, mouth, throat, and face: Denies mucositis or sore throat Respiratory: Denies cough, dyspnea or wheezes Cardiovascular: Denies palpitation, chest discomfort or lower extremity swelling Gastrointestinal:  Denies nausea, heartburn or change in bowel habits Skin: Denies abnormal skin rashes Lymphatics: Denies new lymphadenopathy or easy bruising Neurological:Denies numbness, tingling or new weaknesses Behavioral/Psych: Mood is stable, no new changes  Breast:  denies any pain or lumps or nodules in either breasts All other systems were reviewed with the patient and are negative.  I have reviewed the past medical history, past surgical history, social history and family history with the patient and they are unchanged from previous note.  ALLERGIES:  is allergic to bee venom; contrast media; iodine; latex; penicillins; shellfish allergy; aspirin; erythromycin; lidocaine; symbicort; codeine; compazine; pentazocine; pentazocine lactate; sulfamethoxazole; and sulfonamide derivatives.  MEDICATIONS:  Current Outpatient Prescriptions  Medication Sig Dispense Refill  . albuterol (PROVENTIL HFA;VENTOLIN HFA) 108 (90 BASE) MCG/ACT inhaler Inhale 2 puffs into the lungs every 6 (six) hours as needed for wheezing or shortness of breath. Please dispense Proair 8.5 g 5  . albuterol (PROVENTIL) (5 MG/ML) 0.5% nebulizer solution Take 2.5 mg by nebulization every 6 (six) hours as needed for wheezing or shortness of breath.    . Calcium Carbonate (CALCIUM 500 PO) Take 1 tablet by mouth daily.     Marland Kitchen dexamethasone (DECADRON) 4 MG tablet Take 2 tablets by mouth once a day on the day after chemotherapy and then take 2 tablets two times a day for 2 days. Take with food. 30 tablet 1  . diphenhydrAMINE (BENADRYL) 25  MG tablet Take 1 tablet an hour before MRI.  Then take 1 tablet at bedtime as needed, per your usual regimen. 30 tablet 0  . DULoxetine  (CYMBALTA) 60 MG capsule TAKE 1 CAPSULE DAILY 90 capsule 0  . EPINEPHrine 0.3 mg/0.3 mL IJ SOAJ injection Inject 0.3 mLs (0.3 mg total) into the muscle once. 2 Device 0  . Ergocalciferol (VITAMIN D2) 400 UNITS TABS Take 1 tablet by mouth daily.    . Fluticasone-Salmeterol (ADVAIR DISKUS) 250-50 MCG/DOSE AEPB Inhale 1 puff into the lungs 2 (two) times daily. 180 each 1  . levothyroxine (SYNTHROID, LEVOTHROID) 150 MCG tablet Take 1 tablet (150 mcg total) by mouth daily. 30 tablet 1  . LORazepam (ATIVAN) 0.5 MG tablet Take 1 tablet (0.5 mg total) by mouth every 6 (six) hours as needed (Nausea or vomiting). 30 tablet 0  . meloxicam (MOBIC) 7.5 MG tablet Take 1 tablet (7.5 mg total) by mouth daily. 90 tablet 1  . Multiple Vitamin (MULTIVITAMIN) tablet Take 1 tablet by mouth daily.      . nitroGLYCERIN (NITROSTAT) 0.4 MG SL tablet Place 1 tablet (0.4 mg total) under the tongue every 5 (five) minutes as needed for chest pain. 30 tablet 0  . nystatin (MYCOSTATIN) 100000 UNIT/ML suspension Take 5 mLs (500,000 Units total) by mouth 4 (four) times daily. 240 mL 0  . Omega-3 Fatty Acids (FISH OIL) 1200 MG CAPS Take 1 capsule by mouth daily.      Marland Kitchen omeprazole (PRILOSEC) 40 MG capsule Take 1 capsule (40 mg total) by mouth daily. 90 capsule 1  . ondansetron (ZOFRAN) 8 MG tablet Take 1 tablet (8 mg total) by mouth 2 (two) times daily as needed. Start on the third day after chemotherapy. 30 tablet 1  . oxybutynin (DITROPAN-XL) 5 MG 24 hr tablet Take 1 tablet (5 mg total) by mouth daily. 90 tablet 1  . oxyCODONE-acetaminophen (ROXICET) 5-325 MG per tablet 1-2 tablets q 4h prn 20 tablet 0  . prochlorperazine (COMPAZINE) 10 MG tablet   1  . simvastatin (ZOCOR) 40 MG tablet TAKE 1 TABLET DAILY 90 tablet 1  . traMADol (ULTRAM) 50 MG tablet      No current facility-administered medications for this visit.    PHYSICAL EXAMINATION: ECOG PERFORMANCE STATUS: 1 - Symptomatic but completely ambulatory  Filed Vitals:    08/18/14 1121  BP: 150/84  Pulse: 86  Temp: 97.6 F (36.4 C)  Resp: 18   Filed Weights   08/18/14 1121  Weight: 262 lb 14.4 oz (119.251 kg)    GENERAL:alert, no distress and comfortable SKIN: skin color, texture, turgor are normal, no rashes or significant lesions EYES: normal, Conjunctiva are pink and non-injected, sclera clear OROPHARYNX:no exudate, no erythema and lips, buccal mucosa, and tongue normal  NECK: supple, thyroid normal size, non-tender, without nodularity LYMPH:  no palpable lymphadenopathy in the cervical, axillary or inguinal LUNGS: clear to auscultation and percussion with normal breathing effort HEART: regular rate & rhythm and no murmurs and no lower extremity edema ABDOMEN:abdomen soft, non-tender and normal bowel sounds Musculoskeletal:no cyanosis of digits and no clubbing  NEURO: alert & oriented x 3 with fluent speech, no focal motor/sensory deficits  LABORATORY DATA:  I have reviewed the data as listed   Chemistry      Component Value Date/Time   NA 143 08/18/2014 1108   NA 140 05/31/2011 0905   K 4.5 08/18/2014 1108   K 4.6 05/31/2011 0905   CL 106 05/31/2011 0905  CO2 28 08/18/2014 1108   CO2 24 05/31/2011 0905   BUN 7.8 08/18/2014 1108   BUN 17 05/31/2011 0905   CREATININE 0.8 08/18/2014 1108   CREATININE 0.83 05/31/2011 0905   CREATININE 0.73 12/21/2009 1937      Component Value Date/Time   CALCIUM 9.5 08/18/2014 1108   CALCIUM 9.4 05/31/2011 0905   ALKPHOS 69 08/18/2014 1108   ALKPHOS 52 08/11/2013 0918   AST 35* 08/18/2014 1108   AST 19 08/11/2013 0918   ALT 48 08/18/2014 1108   ALT 24 08/11/2013 0918   BILITOT 0.35 08/18/2014 1108   BILITOT 0.4 08/11/2013 0918       Lab Results  Component Value Date   WBC 4.1 08/18/2014   HGB 11.9 08/18/2014   HCT 38.0 08/18/2014   MCV 86.7 08/18/2014   PLT 325 08/18/2014   NEUTROABS 2.5 08/18/2014   ASSESSMENT & PLAN:  Breast cancer of upper-outer quadrant of left female  breast Left breast invasive ductal carcinoma status post lumpectomy 3.8 cm, 1/4 SLN positive grade 2, ER 99%, PR 100%, HER-2 negative ratio 0.74; T2 N1 A. M0 stage IIB  Current treatment: Adjuvant chemotherapy Completed 4 cycles of dose dense Adriamycin and Cytoxan. Today is week 12/12 Abraxane (last cycle)  Toxicities of chemotherapy: 1. Chemotherapy-induced neutropenia: reduced the dosage of Abraxane to 85 mg/m from week 3  2. Chemotherapy-induced diarrhea resolved with Imodium 3. Esophageal spasms diminished  4. Fatigue related to chemotherapy 5. Alopecia Recommendation: 1. After completion of 12 weeks of Abraxane, we will obtain an appointment with radiation oncology for adjuvant radiation therapy. 2. Radiation to be followed by oral antiestrogen therapy  [name] Study: I recommended that she participate in clinical trial evaluating the combination of Palbociclib with antiestrogen therapy. She will return back to see me after radiation is complete and decide whether she would like to participate in the clinical trial. I provided her with a copy of the consent form.     No orders of the defined types were placed in this encounter.   The patient has a good understanding of the overall plan. she agrees with it. She will call with any problems that may develop before her next visit here.   Rulon Eisenmenger, MD

## 2014-08-18 NOTE — Telephone Encounter (Signed)
Appointments made and patient will get a new avs in chemo °

## 2014-08-19 ENCOUNTER — Telehealth: Payer: Self-pay | Admitting: Hematology and Oncology

## 2014-08-19 NOTE — Telephone Encounter (Signed)
Patient called as she finished her tx 3/29 and all other lab and tx needs to be cancelled as she will return to see him 5/31

## 2014-08-20 ENCOUNTER — Encounter: Payer: Self-pay | Admitting: Radiation Oncology

## 2014-08-20 NOTE — Progress Notes (Signed)
Location of Breast Cancer: left upper outer  Histology per Pathology Report:  02/27/14 Diagnosis 1. 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 - DUCTAL CARCINOMA IN SITU, INTERMEDIATE GRADE. - LYMPHOVASCULAR INVASION IS IDENTIFIED. - THE SURGICAL RESECTION MARGINS OF SPECIMEN #1 ARE NEGATIVE FOR CARCInoma 2. Lymph node, sentinel, biopsy, Left axillary #1 - METASTATIC CARCINOMA IN 1 OF 1 LYMPH NODE (1/1). 3. Lymph node, sentinel, biopsy, Left axillary #2 - THERE IS NO EVIDENCE OF CARCINOMA IN 1 OF 1 LYMPH NODE (0/1). 4. Lymph node, sentinel, biopsy, Left axillary #3 - THERE IS NO EVIDENCE OF CARCINOMA IN 1 OF 1 LYMPH NODE (0/1). 5. Lymph node, sentinel, biopsy, Left axillary - BENIGN FIBROADIPOSE TISSUE. - LYMPH NODAL TISSUE IS NOT IDENTIFIED. 6. Lymph node, sentinel, biopsy, Left axillary #4 - THERE IS NO EVIDENCE OF CARCINOMA IN 1 OF 1 LYMPH NODE (0/1). 7. Breast, excision, left medial margin - BENIGN BREAST PARENCHYMA. - THERE IS NO EVIDENCE OF MALIGNANCY. 8. Breast, excision, Left inferior margin - BENIGN BREAST PARENCHYMA. - THERE IS NO EVIDENCE OF MALIGNANCY. 9. Breast, excision, Left superior margin - BENIGN BREAST PARENCHYMA. - THERE IS NO EVIDENCE OF MALIGNANCY. 10. Breast, excision, Left lateral margin - BENIGN BREAST PARENCHYMA. - THERE IS NO EVIDENCE OF MALIGNANCY. 11. Breast, excision, Left anterior margin - INVASIVE DUCTAL CARCINOMA LESS THAN 0.1 CM TO THE NON-ORIENTED TISSUE EDGES IN SEVERAL FOCI.  Receptor Status: ER(99%), PR (100%), Her2-neu (-)  Did patient present with symptoms (if so, please note symptoms) or was this found on screening mammography?:  Patient felt a lump, then had mammogram.  Past/Anticipated interventions by surgeon, if any: Dr Excell Seltzer- biopsy, lumpectomy w/node sampling  Past/Anticipated interventions by medical oncology, if any: Chemotherapy Dr Lindi Adie:  Adjuvant chemotherapy begun 03/31/14 with  dose dense Adriamycin Cytoxan x 4 followed by Abraxane weekly x 12, last dose 08/18/14.  Next FU on 10/20/14.  Lymphedema issues, if any:      Pain issues, if any:     SAFETY ISSUES:  Prior radiation? no  Pacemaker/ICD? no  Possible current pregnancy? no  Is the patient on methotrexate? no  Current Complaints / other details:  Married Menarche age 47, P 2, first birth age 39, menopause age 70, Premarin x 23 years Mother with hx breast, ovarian cancer, deceased. Sister with hx ovarian tumor and thyroid cancer.    Andria Rhein, RN 08/20/2014,12:41 PM

## 2014-08-25 ENCOUNTER — Other Ambulatory Visit: Payer: BLUE CROSS/BLUE SHIELD

## 2014-08-25 ENCOUNTER — Ambulatory Visit: Payer: BLUE CROSS/BLUE SHIELD

## 2014-08-26 ENCOUNTER — Telehealth: Payer: Self-pay | Admitting: *Deleted

## 2014-08-26 ENCOUNTER — Ambulatory Visit
Admission: RE | Admit: 2014-08-26 | Discharge: 2014-08-26 | Disposition: A | Discharge status: Home / Self Care | Payer: BLUE CROSS/BLUE SHIELD | Source: Ambulatory Visit | Attending: Radiation Oncology | Admitting: Radiation Oncology

## 2014-08-26 ENCOUNTER — Encounter: Payer: Self-pay | Admitting: Radiation Oncology

## 2014-08-26 VITALS — BP 126/86 | HR 105 | Resp 18 | Ht 67.0 in | Wt 266.0 lb

## 2014-08-26 DIAGNOSIS — C50412 Malignant neoplasm of upper-outer quadrant of left female breast: Secondary | ICD-10-CM | POA: Insufficient documentation

## 2014-08-26 DIAGNOSIS — D0592 Unspecified type of carcinoma in situ of left breast: Secondary | ICD-10-CM

## 2014-08-26 NOTE — Progress Notes (Addendum)
Radiation Oncology         [phone] ________________________________  Name: Michelle Kennedy MRN: [id number]  Date: 08/26/2014  DOB: [birth year]  Follow-Up Visit Note  Outpatient  CC: Nance Pear., NP  Nicholas Lose, MD  Diagnosis:   Left Breast invasive ductal carcinoma, ER/PR +, Her2-, Ki6711% (UOQ)    ICD-9-CM ICD-10-CM   1. Breast cancer of upper-outer quadrant of left female breast 174.4 C50.412 Ambulatory referral to Physical Therapy        Clinical: Stage IIB (T3, N0, cM0)       Pathologic: Stage IIB (T2, N1a, cM0)  Narrative:  The patient returns today for follow-up.     She underwent surgery in the form of lumpectomy and SLN bx on 02-24-15.  Pathology revealed 3.8 cm tumor, Grade 2,close but negative margins, 1/4 SLN positive.  She proceeded with adjuvant chemotherapy  with dose dense Adriamycin Cytoxan x4 followed by Abraxane weekly x12; completed on 08-18-14  She recently had her seroma aspirated from Dr Johney Maine but did not have to start antibiotics, she reports  She notes new Left arm and hand edema, not receiving PT                   ALLERGIES:  is allergic to bee venom; contrast media; iodine; latex; penicillins; shellfish allergy; aspirin; erythromycin; lidocaine; symbicort; codeine; compazine; pentazocine; pentazocine lactate; sulfamethoxazole; and sulfonamide derivatives.  Meds: Current Outpatient Prescriptions  Medication Sig Dispense Refill  . albuterol (PROVENTIL HFA;VENTOLIN HFA) 108 (90 BASE) MCG/ACT inhaler Inhale 2 puffs into the lungs every 6 (six) hours as needed for wheezing or shortness of breath. Please dispense Proair 8.5 g 5  . albuterol (PROVENTIL) (5 MG/ML) 0.5% nebulizer solution Take 2.5 mg by nebulization every 6 (six) hours as needed for wheezing or shortness of breath.    . Calcium Carbonate (CALCIUM 500 PO) Take 1 tablet by mouth daily.     . diphenhydrAMINE (BENADRYL) 25 MG tablet Take 1 tablet an hour before MRI.  Then take 1  tablet at bedtime as needed, per your usual regimen. 30 tablet 0  . DULoxetine (CYMBALTA) 60 MG capsule TAKE 1 CAPSULE DAILY 90 capsule 0  . Ergocalciferol (VITAMIN D2) 400 UNITS TABS Take 1 tablet by mouth daily.    . Fluticasone-Salmeterol (ADVAIR DISKUS) 250-50 MCG/DOSE AEPB Inhale 1 puff into the lungs 2 (two) times daily. 180 each 1  . levothyroxine (SYNTHROID, LEVOTHROID) 150 MCG tablet Take 1 tablet (150 mcg total) by mouth daily. 30 tablet 1  . meloxicam (MOBIC) 7.5 MG tablet Take 1 tablet (7.5 mg total) by mouth daily. 90 tablet 1  . Multiple Vitamin (MULTIVITAMIN) tablet Take 1 tablet by mouth daily.      . Omega-3 Fatty Acids (FISH OIL) 1200 MG CAPS Take 1 capsule by mouth daily.      Marland Kitchen omeprazole (PRILOSEC) 40 MG capsule Take 1 capsule (40 mg total) by mouth daily. 90 capsule 1  . oxybutynin (DITROPAN-XL) 5 MG 24 hr tablet Take 1 tablet (5 mg total) by mouth daily. 90 tablet 1  . simvastatin (ZOCOR) 40 MG tablet TAKE 1 TABLET DAILY 90 tablet 1  . traMADol (ULTRAM) 50 MG tablet     . dexamethasone (DECADRON) 4 MG tablet Take 2 tablets by mouth once a day on the day after chemotherapy and then take 2 tablets two times a day for 2 days. Take with food. (Patient not taking: Reported on 08/26/2014) 30 tablet 1  . EPINEPHrine 0.3 mg/0.3  mL IJ SOAJ injection Inject 0.3 mLs (0.3 mg total) into the muscle once. (Patient not taking: Reported on 08/26/2014) 2 Device 0  . LORazepam (ATIVAN) 0.5 MG tablet Take 1 tablet (0.5 mg total) by mouth every 6 (six) hours as needed (Nausea or vomiting). (Patient not taking: Reported on 08/26/2014) 30 tablet 0  . nitroGLYCERIN (NITROSTAT) 0.4 MG SL tablet Place 1 tablet (0.4 mg total) under the tongue every 5 (five) minutes as needed for chest pain. (Patient not taking: Reported on 08/26/2014) 30 tablet 0  . nystatin (MYCOSTATIN) 100000 UNIT/ML suspension Take 5 mLs (500,000 Units total) by mouth 4 (four) times daily. (Patient not taking: Reported on 08/26/2014) 240 mL 0    . ondansetron (ZOFRAN) 8 MG tablet Take 1 tablet (8 mg total) by mouth 2 (two) times daily as needed. Start on the third day after chemotherapy. (Patient not taking: Reported on 08/26/2014) 30 tablet 1  . oxyCODONE-acetaminophen (ROXICET) 5-325 MG per tablet 1-2 tablets q 4h prn (Patient not taking: Reported on 08/26/2014) 20 tablet 0  . prochlorperazine (COMPAZINE) 10 MG tablet   1   No current facility-administered medications for this encounter.    Physical Findings: The patient is in no acute distress. Patient is alert and oriented.  height is 5' 7"  (1.702 m) and weight is 266 lb (120.657 kg). Her blood pressure is 126/86 and her pulse is 105. Her respiration is 18 and oxygen saturation is 100%. General: Alert and oriented, in no acute distress HEENT: alopecia Neck: Neck is supple, no palpable cervical or supraclavicular lymphadenopathy. Extremities:LUE edema. Lymphatics: see Neck Exam BREASTS: left UOQ seroma with no drainage or sign of infection Neurologic: Cranial nerves II through XII are grossly intact. No obvious focalities. Speech is fluent.  Psychiatric: Judgment and insight are intact. Affect is appropriate.    Lab Findings: Lab Results  Component Value Date   WBC 4.1 08/18/2014   HGB 11.9 08/18/2014   HCT 38.0 08/18/2014   MCV 86.7 08/18/2014   PLT 325 08/18/2014       Radiographic Findings: No results found.  Impression/Plan:  We discussed adjuvant radiotherapy today.  I recommend radiotherapy to the left breast, axilla, and SCV nodes in order to reduce risk of locoregional recurrence by 2/3.  The risks, benefits and side effects of this treatment were discussed in detail.  She understands that radiotherapy is associated with skin irritation and fatigue in the acute setting. Late effects can include cosmetic changes and rare injury to internal organs.   She is enthusiastic about proceeding with treatment. A consent form was signed today. Simulation to plan 6.5 weeks  of RT will take place on 4-11, and plan to start tx on 4-20.   Refer to PT for left arm swelling. _____________________________________   [name], MD

## 2014-08-26 NOTE — Progress Notes (Signed)
See progress note under physician encounter. 

## 2014-08-26 NOTE — Telephone Encounter (Signed)
CALLED PATIENT TO INFORM OF PT APPT. ON 08-27-14 - ARRIVAL TIME - 3:45 PM - ADDRESS- [place], [place]. NO. [phone], LVM FOR A RETURN CALL

## 2014-08-26 NOTE — Progress Notes (Signed)
Feels edema in left arm. Denies participating in any therapy for lymphedema. FULL ROM of both upper extremities. 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 Reports neuropathy worse in her feet and at night. Denies nipple discharge. Reports occasionally her left breast feels fevered in relation to her right. Reports the nipple of her left breast has always been inverted. Reports the color of her left nipple is different now from her right. Completed 16 weeks of chemotherapy on 3/29. Denies pain. Heart rate elevated slightly. BP WDL.

## 2014-08-27 ENCOUNTER — Telehealth: Payer: Self-pay | Admitting: Hematology and Oncology

## 2014-08-27 ENCOUNTER — Ambulatory Visit: Payer: BLUE CROSS/BLUE SHIELD | Attending: Radiation Oncology | Admitting: Physical Therapy

## 2014-08-27 ENCOUNTER — Telehealth: Payer: Self-pay | Admitting: *Deleted

## 2014-08-27 ENCOUNTER — Other Ambulatory Visit: Payer: Self-pay | Admitting: *Deleted

## 2014-08-27 DIAGNOSIS — I890 Lymphedema, not elsewhere classified: Secondary | ICD-10-CM | POA: Diagnosis present

## 2014-08-27 DIAGNOSIS — E8989 Other postprocedural endocrine and metabolic complications and disorders: Secondary | ICD-10-CM | POA: Insufficient documentation

## 2014-08-27 NOTE — Therapy (Signed)
[place], [place], [address] Phone: [phone]   Fax:  [phone]  Physical Therapy Evaluation  Patient Details  Name: Michelle Kennedy MRN: [id number] Date of Birth: 02/04/1956 Referring Provider:  [name], MD  Encounter Date: 08/27/2014      PT End of Session - 08/27/14 1609    Visit Number 1   Number of Visits 12   Date for PT Re-Evaluation 09/26/14   PT Start Time 1520   PT Stop Time 1600   PT Time Calculation (min) 40 min   Activity Tolerance Patient tolerated treatment well   Behavior During Therapy [facility] for tasks assessed/performed      Past Medical History  Diagnosis Date  . GERD (gastroesophageal reflux disease)   . Hyperlipidemia   . History of seizure age 56    as a reaction to Penicillin - no seizures since  . Migraines   . Arthritis     hips and knees  . History of gastric ulcer     as a teenager  . UTI (lower urinary tract infection) 02/17/2014  . Fibromyalgia   . Asthma     daily inhaler, prn inhaler and neb.  Marland Kitchen History of thyroid cancer     s/p thyroidectomy  . Breast cancer 01/2014    left  . Family history of anesthesia complication     twin brother aspirated and died on OR table, per pt.  . Abnormally small mouth   . Dental bridge present     upper front and lower right  . Dental crowns present     x 3  . Hypothyroidism     Past Surgical History  Procedure Laterality Date  . Cholecystectomy  1990  . Breast surgery  2011    left breast biposy  . Appendectomy  2004  . Tonsillectomy and adenoidectomy  2000  . Abdominal hysterectomy  2004    complete  . Thyroidectomy  2000  . Colonoscopy w/ polypectomy  06/2009  . Tumor excision      from thoracic spine  . Incontinence surgery  2004  . Achilles tendon repair Right   . Orif toe fracture Right     great toe  . Knee arthroscopy Bilateral     x 6 each knee  . Ligament repair Right     thumb/wrist  .  Eye surgery Right 2013    exc. warts from underneath eyelid  . Breast lumpectomy with needle localization and axillary sentinel lymph node bx Left 02/23/2014    Procedure: BREAST LUMPECTOMY WITH NEEDLE LOCALIZATION AND AXILLARY SENTINEL LYMPH NODE BIOPSY;  Surgeon: Excell Seltzer, MD;  Location: [facility];  Service: General;  Laterality: Left;  . Portacath placement N/A 03/24/2014    Procedure: INSERTION PORT-A-CATH;  Surgeon: Excell Seltzer, MD;  Location: [facility];  Service: General;  Laterality: N/A;    There were no vitals filed for this visit.  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 She has lots of allergies, carries  an epi pen  pt is left handed   Patient Stated Goals to have arm swelling reduced.  she wants to knit without a problem             [facility] PT Assessment - 08/27/14 0001    Assessment   Medical Diagnosis breast cancer    Precautions   Precautions Other (comment)  cancer precautions, multiple allergies    Restrictions   Weight Bearing Restrictions No   Balance Screen   Has the patient fallen in the past 6 months No   Has the patient had a decrease in activity level because of a fear of falling?  No   Is the patient reluctant to leave their home because of a fear of falling?  No   Home Environment   Living Enviornment Private residence   Living Arrangements Spouse/significant other   Available Help at Discharge Available 24 hours/day   Type of Home House   Prior Function   Level of Independence Independent  with basic ADLs;Independent with homemaking with ambulation;Independent with gait;Independent with transfers  has touble buttoning buttons   Vocation Retired   [employer], play guitar, quilt, sew, needlepoint., crochet   Cognition   Overall Cognitive Status Within Functional Limits for tasks assessed   AROM   Right Shoulder Flexion 170 Degrees   Right Shoulder ABduction 165 Degrees   Left Shoulder Flexion 170 Degrees   Left Shoulder ABduction 162 Degrees           LYMPHEDEMA/ONCOLOGY QUESTIONNAIRE - 08/27/14 1543    Type   Cancer Type breast   Surgeries   Lumpectomy Date 03/05/14   Number Lymph Nodes Removed 5   Date Lymphedema/Swelling Started   Date 08/10/14   Treatment   Past Chemotherapy Treatment Yes   What other symptoms do you have   Are you Having Heaviness or Tightness Yes   Are you having Pain Yes   Are you having pitting edema No   Stemmer Sign Yes   Lymphedema Stage   Stage STAGE 1 SPONTANEOUSLY REVERSIBLE   Right Upper Extremity Lymphedema   15 cm Proximal to Olecranon Process 40 cm   10 cm Proximal to Olecranon Process 37 cm   Olecranon Process 30.5 cm   15 cm Proximal to Ulnar Styloid Process 30.6 cm   10 cm Proximal to Ulnar Styloid Process 28.6 cm   Just Proximal to Ulnar Styloid Process 22 cm   Across Hand at [employer] 22.5 cm   At [place] of 2nd Digit 7.7 cm   Left Upper Extremity Lymphedema   15 cm Proximal to Olecranon Process 43.5 cm   10 cm Proximal to Olecranon Process 39.5 cm   Olecranon Process 32 cm   15 cm Proximal to Ulnar Styloid Process 33 cm   10 cm Proximal to Ulnar Styloid Process 30.4 cm   Just Proximal to Ulnar Styloid Process 23.9 cm   Across Hand at [employer] 23 cm   At [place] of 2nd Digit 8 cm           Quick Dash - 08/27/14 0001    Open a tight or new jar Moderate difficulty   Do heavy household chores (wash walls, wash floors) Moderate difficulty   Carry a shopping bag or briefcase Moderate difficulty    Wash your back Moderate difficulty   Use a knife to cut food Moderate difficulty   Recreational activities in which you take some force or impact through your arm,  shoulder, or hand (golf, hammering, tennis) Moderate difficulty   During the past week, to what extent has your arm, shoulder or hand problem interfered with your normal social activities with family, friends, neighbors, or groups? Modererately   During the past week, to what extent has your arm, shoulder or hand problem limited your work or other regular daily activities Modererately   Arm, shoulder, or hand pain. Moderate   Tingling (pins and needles) in your arm, shoulder, or hand Moderate   Difficulty Sleeping Moderate difficulty   DASH Score 50 %                        Short Term Clinic Goals - 08/27/14 1618    CC Short Term Goal  #1   Title Patient will verbalize understanding of lymphedema risk reduction practices   Time 4   Period Weeks   Status New   CC Short Term Goal  #2   Title Patient will be independent in self manual lymph drainage   Time 4   Period Weeks   Status [facility] Goals - 08/27/14 1620    CC Long Term Goal  #1   Title Patient will have a circumferential reduction of     2      cm at   15   cm above the   ulnar styloid    Baseline 33   Time 8   Period Weeks   Status New   CC Long Term Goal  #2   Title Patient will have a circumferential reduction of      2   cm at      15    cm above the   olecranon process   Baseline 40   Time 8   Period Weeks   Status New   CC Long Term Goal  #3   Title Patient will be know how to obtain and use compression garments for maintenance phase of treatment   Time 8   Period Weeks   Status New   CC Long Term Goal  #4   Title Patient will decrease the DASH score to < 20   to demonstrate increased functional use of upper extremity   Baseline 50   Time 8   Period Weeks   Status New            Plan -  08/27/14 1634    Clinical Impression Statement Pt with induration of left medial distal upper arm and medial proximal forearm that is painful to touch, slightly warm and red. Contacted Dr. Geralyn Flash nurse (per patient request) about  rule out DVT prior to initaltion of complete decongestive therapy They will contact the patient    Pt will benefit from skilled therapeutic intervention in order to improve on the following deficits Decreased knowledge of precautions;Pain;Increased edema   Rehab Potential Excellent   Clinical Impairments Affecting Rehab Potential previous seroma, mulitpe allergies with quick "hive" response   PT Frequency 3x / week   PT Duration 8 weeks   PT Treatment/Interventions Therapeutic exercise;Compression bandaging;Patient/family education;DME Instruction;Manual techniques;Manual lymph drainage;Therapeutic activities   PT Next Visit Plan if DVT ruled out, begin complete decongestive therapy    Recommended Other Services ABC class   Consulted and Agree with Plan of Care Patient         Problem List Patient Active Problem List   Diagnosis  Date Noted  . Mucositis due to chemotherapy 06/02/2014  . UTI symptoms 02/17/2014  . Menopausal syndrome (hot flashes) 12/31/2013  . Breast cancer of upper-outer quadrant of left female breast 12/26/2013  . 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 Gingivitis 12/05/2013  . Seasonal allergies 09/24/2013  . Skin lesion 06/03/2013  . Menopause 06/02/2013  . Elevated blood-pressure reading without diagnosis of hypertension 03/05/2013  . Cough 07/24/2012  . Left knee injury 11/02/2011  . Gout 09/19/2011  . Hyperlipidemia 06/02/2011  . Migraine 06/02/2011  . Tinnitus of both ears 02/14/2011  . Left knee pain 09/23/2010  . FIBROMYALGIA 05/10/2010  . Asthma 03/22/2010  . Hypothyroidism 12/20/2009  . OVERWEIGHT 12/20/2009  . GERD 12/20/2009  . DEPRESSION 12/01/2009   Donato Heinz. Owens Shark, PT  08/27/2014, 4:35  PM  [place], [place], [address] Phone: [phone]   Fax:  [phone]

## 2014-08-27 NOTE — Telephone Encounter (Signed)
MD visit per 04/07 POF scheduled with NP/CB pt is aware.... Michelle Kennedy

## 2014-08-27 NOTE — Telephone Encounter (Signed)
Received call from physical therapist Maudry Diego.  She is doing first time eval for PT on patient's left arm. and noted swelling, redness, warmth, and some induration on the medial aspect of upper left arm near elbow.  Therapist is asking for evaluation of this arm for possible infection/thrombosis prior to starting physical therapy. Please advise. Call patient at home regarding this.

## 2014-08-27 NOTE — Telephone Encounter (Signed)
Reviewed with Dr. Lindi Adie, patient called and scheduled appt with Selena Lesser tomorrow, pof sent

## 2014-08-28 ENCOUNTER — Ambulatory Visit (HOSPITAL_COMMUNITY)
Admission: RE | Admit: 2014-08-28 | Discharge: 2014-08-28 | Disposition: A | Discharge status: Home / Self Care | Payer: BLUE CROSS/BLUE SHIELD | Source: Ambulatory Visit | Attending: Cardiology | Admitting: Cardiology

## 2014-08-28 ENCOUNTER — Ambulatory Visit (HOSPITAL_BASED_OUTPATIENT_CLINIC_OR_DEPARTMENT_OTHER): Payer: BLUE CROSS/BLUE SHIELD | Admitting: Nurse Practitioner

## 2014-08-28 ENCOUNTER — Encounter: Payer: Self-pay | Admitting: Nurse Practitioner

## 2014-08-28 VITALS — BP 145/89 | HR 99 | Temp 98.2°F | Resp 20 | Wt 264.9 lb

## 2014-08-28 DIAGNOSIS — M7989 Other specified soft tissue disorders: Secondary | ICD-10-CM

## 2014-08-28 DIAGNOSIS — C50412 Malignant neoplasm of upper-outer quadrant of left female breast: Secondary | ICD-10-CM

## 2014-08-28 DIAGNOSIS — I890 Lymphedema, not elsewhere classified: Secondary | ICD-10-CM | POA: Diagnosis not present

## 2014-08-28 DIAGNOSIS — I82622 Acute embolism and thrombosis of deep veins of left upper extremity: Secondary | ICD-10-CM | POA: Insufficient documentation

## 2014-08-28 DIAGNOSIS — I82409 Acute embolism and thrombosis of unspecified deep veins of unspecified lower extremity: Secondary | ICD-10-CM | POA: Insufficient documentation

## 2014-08-28 DIAGNOSIS — E039 Hypothyroidism, unspecified: Secondary | ICD-10-CM

## 2014-08-28 DIAGNOSIS — I82402 Acute embolism and thrombosis of unspecified deep veins of left lower extremity: Secondary | ICD-10-CM

## 2014-08-28 MED ORDER — RIVAROXABAN (XARELTO) VTE STARTER PACK (15 & 20 MG): ORAL_TABLET | ORAL | Status: DC

## 2014-08-28 NOTE — Progress Notes (Signed)
SYMPTOM MANAGEMENT CLINIC   HPI: Michelle Kennedy 59 y.o. female diagnosed with breast cancer.  Patient is status post lumpectomy and received her last cycle of chemotherapy on 08/18/2014.  Planning to initiate radiation therapy next week.  Patient is complaining of recently developed left upper extremity lymphedema.  She went to her first lymphedema clinic appointment earlier today; and was noted to have some tenderness and increased swelling to the area directly above her left elbow.  She presented to the [facility] [facility] today for further evaluation of a possible DVT.  Patient denies any chest pain, chest pressure, shortness breath, or pain with inspiration.  She also denies any recent fevers or chills.  HPI  ROS  Past Medical History  Diagnosis Date  . GERD (gastroesophageal reflux disease)   . Hyperlipidemia   . History of seizure age 59    as a reaction to Penicillin - no seizures since  . Migraines   . Arthritis     hips and knees  . History of gastric ulcer     as a teenager  . UTI (lower urinary tract infection) 02/17/2014  . Fibromyalgia   . Asthma     daily inhaler, prn inhaler and neb.  Marland Kitchen History of thyroid cancer     s/p thyroidectomy  . Breast cancer 01/2014    left  . Family history of anesthesia complication     twin brother aspirated and died on OR table, per pt.  . Abnormally small mouth   . Dental bridge present     upper front and lower right  . Dental crowns present     x 3  . Hypothyroidism     Past Surgical History  Procedure Laterality Date  . Cholecystectomy  1990  . Breast surgery  2011    left breast biposy  . Appendectomy  2004  . Tonsillectomy and adenoidectomy  2000  . Abdominal hysterectomy  2004    complete  . Thyroidectomy  2000  . Colonoscopy w/ polypectomy  06/2009  . Tumor excision      from thoracic spine  . Incontinence surgery  2004  . Achilles tendon repair Right   . Orif toe fracture Right     great toe  . Knee arthroscopy  Bilateral     x 6 each knee  . Ligament repair Right     thumb/wrist  . Eye surgery Right 2013    exc. warts from underneath eyelid  . Breast lumpectomy with needle localization and axillary sentinel lymph node bx Left 02/23/2014    Procedure: BREAST LUMPECTOMY WITH NEEDLE LOCALIZATION AND AXILLARY SENTINEL LYMPH NODE BIOPSY;  Surgeon: Excell Seltzer, MD;  Location: [place];  Service: General;  Laterality: Left;  . Portacath placement N/A 03/24/2014    Procedure: INSERTION PORT-A-CATH;  Surgeon: Excell Seltzer, MD;  Location: [place];  Service: General;  Laterality: N/A;    has Hypothyroidism; OVERWEIGHT; DEPRESSION; Asthma; GERD; FIBROMYALGIA; Left knee pain; Tinnitus of both ears; Hyperlipidemia; Migraine; Gout; Left knee injury; Cough; Elevated blood-pressure reading without diagnosis of hypertension; Menopause; Skin lesion; Seasonal allergies; Gingivitis; Ductal carcinoma in situ (DCIS) of left breast; Breast cancer of upper-outer quadrant of left female breast; Menopausal syndrome (hot flashes); UTI symptoms; Mucositis due to chemotherapy; DVT (deep venous thrombosis); and Lymphedema of arm on her problem list.    is allergic to bee venom; contrast media; iodine; latex; penicillins; shellfish allergy; aspirin; erythromycin; lidocaine; symbicort; codeine; compazine; pentazocine; pentazocine lactate;  sulfamethoxazole; and sulfonamide derivatives.    Medication List       This list is accurate as of: 08/28/14  5:27 PM.  Always use your most recent med list.               albuterol (5 MG/ML) 0.5% nebulizer solution  Commonly known as:  PROVENTIL  Take 2.5 mg by nebulization every 6 (six) hours as needed for wheezing or shortness of breath.     albuterol 108 (90 BASE) MCG/ACT inhaler  Commonly known as:  PROVENTIL HFA;VENTOLIN HFA  Inhale 2 puffs into the lungs every 6 (six) hours as needed for wheezing or shortness of breath. Please dispense  Proair     CALCIUM 500 PO  Take 1 tablet by mouth daily.     dexamethasone 4 MG tablet  Commonly known as:  DECADRON  Take 2 tablets by mouth once a day on the day after chemotherapy and then take 2 tablets two times a day for 2 days. Take with food.     diphenhydrAMINE 25 MG tablet  Commonly known as:  BENADRYL  Take 1 tablet an hour before MRI.  Then take 1 tablet at bedtime as needed, per your usual regimen.     DULoxetine 60 MG capsule  Commonly known as:  CYMBALTA  TAKE 1 CAPSULE DAILY     EPINEPHrine 0.3 mg/0.3 mL Soaj injection  Commonly known as:  EPI-PEN  Inject 0.3 mLs (0.3 mg total) into the muscle once.     Fish Oil 1200 MG Caps  Take 1 capsule by mouth daily.     Fluticasone-Salmeterol 250-50 MCG/DOSE Aepb  Commonly known as:  ADVAIR DISKUS  Inhale 1 puff into the lungs 2 (two) times daily.     levothyroxine 150 MCG tablet  Commonly known as:  SYNTHROID, LEVOTHROID  Take 1 tablet (150 mcg total) by mouth daily.     LORazepam 0.5 MG tablet  Commonly known as:  ATIVAN  Take 1 tablet (0.5 mg total) by mouth every 6 (six) hours as needed (Nausea or vomiting).     meloxicam 7.5 MG tablet  Commonly known as:  MOBIC  Take 1 tablet (7.5 mg total) by mouth daily.     multivitamin tablet  Take 1 tablet by mouth daily.     nitroGLYCERIN 0.4 MG SL tablet  Commonly known as:  NITROSTAT  Place 1 tablet (0.4 mg total) under the tongue every 5 (five) minutes as needed for chest pain.     nystatin 100000 UNIT/ML suspension  Commonly known as:  MYCOSTATIN  Take 5 mLs (500,000 Units total) by mouth 4 (four) times daily.     omeprazole 40 MG capsule  Commonly known as:  PRILOSEC  Take 1 capsule (40 mg total) by mouth daily.     ondansetron 8 MG tablet  Commonly known as:  ZOFRAN  Take 1 tablet (8 mg total) by mouth 2 (two) times daily as needed. Start on the third day after chemotherapy.     oxybutynin 5 MG 24 hr tablet  Commonly known as:  DITROPAN-XL  Take 1  tablet (5 mg total) by mouth daily.     oxyCODONE-acetaminophen 5-325 MG per tablet  Commonly known as:  ROXICET  1-2 tablets q 4h prn     prochlorperazine 10 MG tablet  Commonly known as:  COMPAZINE     Rivaroxaban 15 & 20 MG Tbpk  Commonly known as:  XARELTO STARTER PACK  Take as directed on package: Start  with one 36m tablet PO twice daily with food. On Day 22 switch to one 283mtablet once daily with food.     simvastatin 40 MG tablet  Commonly known as:  ZOCOR  TAKE 1 TABLET DAILY     traMADol 50 MG tablet  Commonly known as:  ULTRAM     Vitamin D2 400 UNITS Tabs  Take 1 tablet by mouth daily.         PHYSICAL EXAMINATION  Oncology Vitals 08/28/2014 08/26/2014 08/18/2014 08/11/2014 08/04/2014 07/28/2014 07/21/2014  Height - 170 cm 170 cm - 170 cm - 170 cm  Weight 120.158 kg 120.657 kg 119.251 kg - 119.205 kg - 119.523 kg  Weight (lbs) 264 lbs 14 oz 266 lbs 262 lbs 14 oz - 262 lbs 13 oz - 263 lbs 8 oz  BMI (kg/m2) - 41.66 kg/m2 41.18 kg/m2 - 41.16 kg/m2 - 41.27 kg/m2  Temp 98.2 - 97.6 98.1 97.9 98.1 98.1  Pulse 99 105 86 83 99 98 85  Resp 20 18 18 20 18  - 18  SpO2 99 100 - 97 97 - -  BSA (m2) - 2.39 m2 2.37 m2 - 2.37 m2 - 2.38 m2   BP Readings from Last 3 Encounters:  08/28/14 145/89  08/18/14 150/84  08/11/14 138/97    Physical Exam  Constitutional: She is oriented to person, place, and time and well-developed, well-nourished, and in no distress.  HENT:  Head: Normocephalic and atraumatic.  Eyes: Conjunctivae and EOM are normal. Pupils are equal, round, and reactive to light. Right eye exhibits no discharge. Left eye exhibits no discharge. No scleral icterus.  Neck: Normal range of motion.  Pulmonary/Chest: Effort normal. No respiratory distress.  Musculoskeletal: Normal range of motion. She exhibits edema. She exhibits no tenderness.  Left upper extremity with obvious lymphedema.  Patient also has an area directly above her left elbow with increased edema and trace  tenderness.  There is no erythema, warmth, or red streaks.  Neurological: She is alert and oriented to person, place, and time. Gait normal.  Skin: Skin is warm and dry. No rash noted. No erythema. No pallor.  Psychiatric: Affect normal.  Nursing note and vitals reviewed.   LABORATORY DATA:. No visits with results within 3 Day(s) from this visit. Latest known visit with results is:  Appointment on 08/18/2014  Component Date Value Ref Range Status  . WBC 08/18/2014 4.1  3.9 - 10.3 10e3/uL Final  . NEUT# 08/18/2014 2.5  1.5 - 6.5 10e3/uL Final  . HGB 08/18/2014 11.9  11.6 - 15.9 g/dL Final  . HCT 08/18/2014 38.0  34.8 - 46.6 % Final  . Platelets 08/18/2014 325  145 - 400 10e3/uL Final  . MCV 08/18/2014 86.7  79.5 - 101.0 fL Final  . MCH 08/18/2014 27.2  25.1 - 34.0 pg Final  . MCHC 08/18/2014 31.3* 31.5 - 36.0 g/dL Final  . RBC 08/18/2014 4.38  3.70 - 5.45 10e6/uL Final  . RDW 08/18/2014 17.2* 11.2 - 14.5 % Final  . lymph# 08/18/2014 1.2  0.9 - 3.3 10e3/uL Final  . MONO# 08/18/2014 0.3  0.1 - 0.9 10e3/uL Final  . Eosinophils Absolute 08/18/2014 0.1  0.0 - 0.5 10e3/uL Final  . Basophils Absolute 08/18/2014 0.0  0.0 - 0.1 10e3/uL Final  . NEUT% 08/18/2014 59.4  38.4 - 76.8 % Final  . LYMPH% 08/18/2014 29.2  14.0 - 49.7 % Final  . MONO% 08/18/2014 8.2  0.0 - 14.0 % Final  . EOS% 08/18/2014 2.2  0.0 - 7.0 % Final  . BASO% 08/18/2014 1.0  0.0 - 2.0 % Final  . Sodium 08/18/2014 143  136 - 145 mEq/L Final  . Potassium 08/18/2014 4.5  3.5 - 5.1 mEq/L Final  . Chloride 08/18/2014 108  98 - 109 mEq/L Final  . CO2 08/18/2014 28  22 - 29 mEq/L Final  . Glucose 08/18/2014 108  70 - 140 mg/dl Final  . BUN 08/18/2014 7.8  7.0 - 26.0 mg/dL Final  . Creatinine 08/18/2014 0.8  0.6 - 1.1 mg/dL Final  . Total Bilirubin 08/18/2014 0.35  0.20 - 1.20 mg/dL Final  . Alkaline Phosphatase 08/18/2014 69  40 - 150 U/L Final  . AST 08/18/2014 35* 5 - 34 U/L Final  . ALT 08/18/2014 48  0 - 55 U/L Final  .  Total Protein 08/18/2014 6.0* 6.4 - 8.3 g/dL Final  . Albumin 08/18/2014 3.5  3.5 - 5.0 g/dL Final  . Calcium 08/18/2014 9.5  8.4 - 10.4 mg/dL Final  . Anion Gap 08/18/2014 7  3 - 11 mEq/L Final  . EGFR 08/18/2014 85* >90 ml/min/1.73 m2 Final   eGFR is calculated using the CKD-EPI Creatinine Equation (2009)   Doppler US: Left upper extremity venous duplex completed. Evidence for DVT in one out of two brachial veins. Brianna L Mazza,RVT  RADIOGRAPHIC STUDIES: No results found.  ASSESSMENT/PLAN:    Lymphedema of arm Patient is status post left breast lumpectomy.  She completed her last chemotherapy on 08/18/2014.  She is only recently developed some left upper extremity lymphedema.  She went to her first lymphedema clinic appointment earlier today; and was noted to have some tenderness and increased swelling to the area directly above her left elbow.  She presented to the [facility] today for further evaluation of a possible DVT.  Patient was found to have a DVT in her left upper extremity today per Doppler ultrasound.  Patient states that she will hold on any further lymphedema clinic visits until her initial stage of Xarelto therapy is completed.  I did encourage patient to return to the lymphedema clinic after  approximately 3 weeks of her  initial  Xarelto therapy.   DVT (deep venous thrombosis) Patient is status post left breast lumpectomy.  She completed her last chemotherapy on 08/18/2014.  She is only recently developed some left upper extremity lymphedema.  She went to her first lymphedema clinic appointment earlier today; and was noted to have some tenderness and increased swelling to the area directly above her left elbow.  She presented to the [facility] today for further evaluation of a possible DVT.  On exam-patient with obvious left upper extremity lymphedema.  She also has some mild tenderness and increased edema to the area directly above her left elbow.  There is no  erythema, warmth, red streaks to the site.  Ultrasound obtained today did reveal a DVT to the left upper extremity.  After consulting with Dr. Elease Etienne was prescribed a Xarelto starter pack.     Breast cancer of upper-outer quadrant of left female breast Patient is status post left breast lumpectomy.  She completed her last cycle of Abraxane chemotherapy on 08/18/2014.  She is scheduled for a radiation oncology appt  on 08/31/2014 for CT-Sim.  She will then begin her radiation treatments.  Patient is scheduled to return to the [facility] for labs and a follow-up visit with Dr. Lindi Adie on 10/20/2014.  Patient is to call me interim with any new worries or concerns  whatsoever.   Patient stated understanding of all instructions; and was in agreement with this plan of care. The patient knows to call the clinic with any problems, questions or concerns.   Review/collaboration with Dr. Lindi Adie regarding all aspects of patient's visit today.   Total time spent with patient was 40 minutes;  with greater than 75 percent of that time spent in face to face counseling regarding patient's symptoms,  and coordination of care and follow up.  Disclaimer: This note was dictated with voice recognition software. Similar sounding words can inadvertently be transcribed and may not be corrected upon review.   Drue Second, NP 08/28/2014

## 2014-08-28 NOTE — Progress Notes (Signed)
Left upper extremity venous duplex completed. Evidence for DVT in one out of two brachial veins. [place]

## 2014-08-28 NOTE — Assessment & Plan Note (Signed)
Patient is status post left breast lumpectomy.  She completed her last chemotherapy on 08/18/2014.  She is only recently developed some left upper extremity lymphedema.  She went to her first lymphedema clinic appointment earlier today; and was noted to have some tenderness and increased swelling to the area directly above her left elbow.  She presented to the [facility] today for further evaluation of a possible DVT.  Patient was found to have a DVT in her left upper extremity today per Doppler ultrasound.  Patient states that she will hold on any further lymphedema clinic visits until her initial stage of Xarelto therapy is completed.  I did encourage patient to return to the lymphedema clinic after  approximately 3 weeks of her  initial  Xarelto therapy.

## 2014-08-28 NOTE — Assessment & Plan Note (Signed)
Patient is status post left breast lumpectomy.  She completed her last chemotherapy on 08/18/2014.  She is only recently developed some left upper extremity lymphedema.  She went to her first lymphedema clinic appointment earlier today; and was noted to have some tenderness and increased swelling to the area directly above her left elbow.  She presented to the [facility] today for further evaluation of a possible DVT.  On exam-patient with obvious left upper extremity lymphedema.  She also has some mild tenderness and increased edema to the area directly above her left elbow.  There is no erythema, warmth, red streaks to the site.  Ultrasound obtained today did reveal a DVT to the left upper extremity.  After consulting with Dr. Elease Etienne was prescribed a Xarelto starter pack.

## 2014-08-28 NOTE — Assessment & Plan Note (Signed)
Patient is status post left breast lumpectomy.  She completed her last cycle of Abraxane chemotherapy on 08/18/2014.  She is scheduled for a radiation oncology appt  on 08/31/2014 for CT-Sim.  She will then begin her radiation treatments.  Patient is scheduled to return to the [facility] for labs and a follow-up visit with Dr. Lindi Adie on 10/20/2014.  Patient is to call me interim with any new worries or concerns whatsoever.

## 2014-08-29 ENCOUNTER — Other Ambulatory Visit: Payer: Self-pay | Admitting: Family

## 2014-08-31 ENCOUNTER — Ambulatory Visit
Admission: RE | Admit: 2014-08-31 | Discharge: 2014-08-31 | Disposition: A | Discharge status: Home / Self Care | Payer: BLUE CROSS/BLUE SHIELD | Source: Ambulatory Visit | Attending: Radiation Oncology | Admitting: Radiation Oncology

## 2014-08-31 ENCOUNTER — Other Ambulatory Visit: Payer: Self-pay | Admitting: *Deleted

## 2014-08-31 DIAGNOSIS — C50412 Malignant neoplasm of upper-outer quadrant of left female breast: Secondary | ICD-10-CM

## 2014-08-31 MED ORDER — TRAMADOL HCL 50 MG PO TABS: 50.0000 mg | ORAL_TABLET | Freq: Four times a day (QID) | ORAL | Status: DC | PRN

## 2014-08-31 NOTE — Progress Notes (Signed)
  Radiation Oncology         [phone] ________________________________  Name: Michelle Kennedy MRN: [id number]  Date: 08/31/2014  DOB: 1955/10/21  OUTPATIENT   ICD-9-CM ICD-10-CM   1. Breast cancer of upper-outer quadrant of left female breast 174.4 C50.412     SIMULATION AND TREATMENT PLANNING NOTE  NARRATIVE:  The patient was brought to the [facility].  Identity was confirmed.  All relevant records and images related to the planned course of therapy were reviewed.  The patient freely provided informed written consent to proceed with treatment after reviewing the details related to the planned course of therapy. The consent form was witnessed and verified by the simulation staff.    Then, the patient was set-up in a stable reproducible  supine position for radiation therapy.  CT images were obtained.  Surface markings were placed.  The CT images were loaded into the planning software.    Breathhold technique was not felt to be necessary to spare her heart.     TREATMENT PLANNING NOTE: Treatment planning then occurred.  The radiation prescription was entered and confirmed.    A total of 5 medically necessary complex treatment devices were fabricated and supervised by me: Vaclock for arm, and 4 fields with MLCs to block lung, cord, esophagus, heart. I have requested : 3D Simulation  I have requested a DVH of the following structures: lung, cord, esophagus, heart.    The patient will receive 50.4 Gy in 28 fractions to the LEFT BREAST and 45 Gy in 25 fractions to the  regional nodes, using 4 photon fields, and later a 10 Gy boost in 5 fractions to her lumpectomy cavity  Optical Surface Tracking Plan:  Since intensity modulated radiotherapy (IMRT) and 3D conformal radiation treatment methods are predicated on accurate and precise positioning for treatment, intrafraction motion monitoring is medically necessary to ensure accurate and safe treatment delivery. The ability to  quantify intrafraction motion without excessive ionizing radiation dose can only be performed with optical surface tracking. Accordingly, surface imaging offers the opportunity to obtain 3D measurements of patient position throughout IMRT and 3D treatments without excessive radiation exposure. I am ordering optical surface tracking for this patient's upcoming course of radiotherapy.  ________________________________   Reference:  Ursula Alert, J, et al. Surface imaging-based analysis of intrafraction motion for breast radiotherapy patients.Journal of [place], n. 6, nov. 2014. ISSN [id number].  Available at: <http://www.jacmp.org/index.php/jacmp/article/view/4957>.   -----------------------------------  [name], MD

## 2014-09-01 ENCOUNTER — Ambulatory Visit: Payer: BLUE CROSS/BLUE SHIELD

## 2014-09-01 ENCOUNTER — Other Ambulatory Visit: Payer: BLUE CROSS/BLUE SHIELD

## 2014-09-01 ENCOUNTER — Telehealth: Payer: Self-pay | Admitting: Nurse Practitioner

## 2014-09-01 NOTE — Telephone Encounter (Signed)
Called pt to follow up on SM visit from 08/28/14. Pt stated she started taking Xarelto 4/11 and still having dull,aching pain in left arm,and  on a scale of 0-10 rate she stated it was a 4-5.  Pt also stated she is taking Tramadol to help with left arm pain. Informed pt of lab and visit appointment for 10/20/14. Pt verbalized understanding.

## 2014-09-02 DIAGNOSIS — C50412 Malignant neoplasm of upper-outer quadrant of left female breast: Secondary | ICD-10-CM | POA: Diagnosis not present

## 2014-09-07 ENCOUNTER — Encounter: Payer: BLUE CROSS/BLUE SHIELD | Admitting: Physical Therapy

## 2014-09-07 ENCOUNTER — Ambulatory Visit: Payer: BLUE CROSS/BLUE SHIELD | Admitting: Radiation Oncology

## 2014-09-08 ENCOUNTER — Ambulatory Visit: Payer: BLUE CROSS/BLUE SHIELD

## 2014-09-08 ENCOUNTER — Other Ambulatory Visit: Payer: BLUE CROSS/BLUE SHIELD

## 2014-09-09 ENCOUNTER — Ambulatory Visit: Payer: BLUE CROSS/BLUE SHIELD

## 2014-09-09 ENCOUNTER — Ambulatory Visit
Admission: RE | Admit: 2014-09-09 | Discharge: 2014-09-09 | Disposition: A | Discharge status: Home / Self Care | Payer: BLUE CROSS/BLUE SHIELD | Source: Ambulatory Visit | Attending: Radiation Oncology | Admitting: Radiation Oncology

## 2014-09-09 DIAGNOSIS — C50412 Malignant neoplasm of upper-outer quadrant of left female breast: Secondary | ICD-10-CM

## 2014-09-10 ENCOUNTER — Ambulatory Visit
Admission: RE | Admit: 2014-09-10 | Discharge: 2014-09-10 | Disposition: A | Discharge status: Home / Self Care | Payer: BLUE CROSS/BLUE SHIELD | Source: Ambulatory Visit | Attending: Radiation Oncology | Admitting: Radiation Oncology

## 2014-09-10 ENCOUNTER — Ambulatory Visit: Payer: BLUE CROSS/BLUE SHIELD

## 2014-09-10 DIAGNOSIS — C50412 Malignant neoplasm of upper-outer quadrant of left female breast: Secondary | ICD-10-CM | POA: Diagnosis not present

## 2014-09-11 ENCOUNTER — Ambulatory Visit: Payer: BLUE CROSS/BLUE SHIELD

## 2014-09-11 ENCOUNTER — Ambulatory Visit
Admission: RE | Admit: 2014-09-11 | Discharge: 2014-09-11 | Disposition: A | Discharge status: Home / Self Care | Payer: BLUE CROSS/BLUE SHIELD | Source: Ambulatory Visit | Attending: Radiation Oncology | Admitting: Radiation Oncology

## 2014-09-11 DIAGNOSIS — C50412 Malignant neoplasm of upper-outer quadrant of left female breast: Secondary | ICD-10-CM | POA: Diagnosis not present

## 2014-09-14 ENCOUNTER — Ambulatory Visit: Payer: BLUE CROSS/BLUE SHIELD

## 2014-09-14 ENCOUNTER — Telehealth: Payer: Self-pay | Admitting: *Deleted

## 2014-09-14 ENCOUNTER — Telehealth: Payer: Self-pay | Admitting: Oncology

## 2014-09-14 ENCOUNTER — Ambulatory Visit
Admission: RE | Admit: 2014-09-14 | Discharge: 2014-09-14 | Disposition: A | Discharge status: Home / Self Care | Payer: BLUE CROSS/BLUE SHIELD | Source: Ambulatory Visit | Attending: Radiation Oncology | Admitting: Radiation Oncology

## 2014-09-14 ENCOUNTER — Encounter: Payer: Self-pay | Admitting: Radiation Oncology

## 2014-09-14 VITALS — BP 137/86 | HR 105 | Temp 99.4°F | Resp 20 | Ht 67.0 in | Wt 259.5 lb

## 2014-09-14 DIAGNOSIS — C50412 Malignant neoplasm of upper-outer quadrant of left female breast: Secondary | ICD-10-CM

## 2014-09-14 MED ORDER — DOXYCYCLINE HYCLATE 100 MG PO TABS: 100.0000 mg | ORAL_TABLET | Freq: Two times a day (BID) | ORAL | Status: DC

## 2014-09-14 MED ORDER — ALRA NON-METALLIC DEODORANT (RAD-ONC): 1.0000 "application " | Freq: Once | TOPICAL | Status: DC

## 2014-09-14 MED ORDER — RADIAPLEXRX EX GEL
Freq: Once | CUTANEOUS | Status: AC
  Administered 2014-09-14: 14:00:00 via TOPICAL

## 2014-09-14 NOTE — Progress Notes (Signed)
Pt here for patient teaching.  Pt given Radiation and You booklet, skin care instructions and Radiaplex gel. Reviewed areas of pertinence such as fatigue and skin changes . Pt able to give teach back of to pat skin and use unscented/gentle soap,apply Radiaplex bid and avoid applying anything to skin within 4 hours of treatment. Pt demonstrated understanding and verbalizes understanding of information given and will contact nursing with any questions or concerns.  Patient tried Alra deoderant on a small area on her wrist.  She said it looked red so she does not want to use it.

## 2014-09-14 NOTE — Addendum Note (Signed)
Encounter addended by: Jacqulyn Liner, RN on: 09/14/2014  1:59 PM<BR>     Documentation filed: Inpatient MAR

## 2014-09-14 NOTE — Telephone Encounter (Signed)
[facility] to make an appointment for Michelle Kennedy today with Dr. Excell Seltzer.  Per the receptionist, Dr. Excell Seltzer is out of the office today.  She can be seen at 4:45 by Dr. Redmond Pulling.  Advised Michelle Kennedy of the appointment time and let her know she would be seen by Dr. Redmond Pulling.  She verbalized understanding and agreement.

## 2014-09-14 NOTE — Progress Notes (Signed)
   Weekly Management Note:  Outpatient    ICD-9-CM ICD-10-CM   1. Breast cancer of upper-outer quadrant of left female breast 174.4 C50.412 hyaluronate sodium (RADIAPLEXRX) gel     doxycycline (VIBRA-TABS) 100 MG tablet     DISCONTINUED: non-metallic deodorant (ALRA) 1 application    Current Dose:  5.4 Gy  Projected Dose: 60.4 Gy   Narrative:  The patient presents for routine under treatment assessment.  CBCT/MVCT images/Port film x-rays were reviewed.  The chart was checked. Michelle Kennedy has completed 3 fractions to her left breast.  She reports pain and redness in her left breast.  She reports that she has had fluid in her left breast before and felt a pocked on Saturday.  On Sunday, she woke up at 5 am to a hot, swollen breast.  She reports her temperature was 100.8 degrees.  She took ibuprofen yesterday but none today.  She has been icing it.  Today her temperature was 99.4 degrees.  Her left breast is red and warm to the touch.  She had fluid aspirated from her seroma before simulation by Dr Johney Maine but did not start ABX at that time      Physical Findings:  height is 5\' 7"  (1.702 m) and weight is 259 lb 8 oz (117.708 kg). Her oral temperature is 99.4 F (37.4 C). Her blood pressure is 137/86 and her pulse is 105. Her respiration is 20 and oxygen saturation is 97%.   Wt Readings from Last 3 Encounters:  09/14/14 259 lb 8 oz (117.708 kg)  08/28/14 264 lb 14.4 oz (120.158 kg)  08/18/14 262 lb 14.4 oz (119.251 kg)   Her left breast is red swollen and warm to the touch with palpable UOQ seroma  Impression:  The patient is tolerating radiotherapy.  Plan:  Continue radiotherapy as planned.  Mastitis vs abscess, infected seroma.  Rx for Doxycycline 100mg  BID x 12 d provided, and Elmo Putt, RN will arrange f/u today with Dr Lear Ng team as a precaution in case of abscess or infected seroma.   ________________________________   [name], M.D.

## 2014-09-14 NOTE — Telephone Encounter (Signed)
Received a message to call pt back in regards to "not feeling well." Called pt at listed number and left a voicemail due to no answer, stating to call nursing back so I can assist her.

## 2014-09-14 NOTE — Progress Notes (Signed)
Michelle Kennedy has completed 3 fractions to her left breast.  She reports pain and redness in her left breast.  She reports that she has had fluid in her left breast before and felt a pocked on Saturday.  On Sunday, she woke up at 5 am to a hot, swollen breast.  She reports her temperature was 100.8 degrees.  She took ibuprofen yesterday but none today.  She has been icing it.  Today her temperature was 99.4 degrees.  Her left breast is red and warm to the touch.    BP 137/86 mmHg  Pulse 105  Temp(Src) 99.4 F (37.4 C) (Oral)  Resp 20  Ht 5\' 7"  (1.702 m)  Wt 259 lb 8 oz (117.708 kg)  BMI 40.63 kg/m2  SpO2 97%

## 2014-09-15 ENCOUNTER — Ambulatory Visit: Payer: BLUE CROSS/BLUE SHIELD

## 2014-09-15 ENCOUNTER — Ambulatory Visit
Admission: RE | Admit: 2014-09-15 | Discharge: 2014-09-15 | Disposition: A | Discharge status: Home / Self Care | Payer: BLUE CROSS/BLUE SHIELD | Source: Ambulatory Visit | Attending: Radiation Oncology | Admitting: Radiation Oncology

## 2014-09-15 ENCOUNTER — Other Ambulatory Visit: Payer: BLUE CROSS/BLUE SHIELD

## 2014-09-15 DIAGNOSIS — C50412 Malignant neoplasm of upper-outer quadrant of left female breast: Secondary | ICD-10-CM | POA: Diagnosis not present

## 2014-09-16 ENCOUNTER — Emergency Department (HOSPITAL_COMMUNITY)
Admission: EM | Admit: 2014-09-16 | Discharge: 2014-09-16 | Disposition: A | Discharge status: Home / Self Care | Payer: BLUE CROSS/BLUE SHIELD | Attending: Emergency Medicine | Admitting: Emergency Medicine

## 2014-09-16 ENCOUNTER — Ambulatory Visit: Payer: BLUE CROSS/BLUE SHIELD

## 2014-09-16 ENCOUNTER — Ambulatory Visit
Admit: 2014-09-16 | Discharge: 2014-09-16 | Disposition: A | Discharge status: Home / Self Care | Payer: BLUE CROSS/BLUE SHIELD | Attending: Radiation Oncology | Admitting: Radiation Oncology

## 2014-09-16 ENCOUNTER — Telehealth: Payer: Self-pay | Admitting: Oncology

## 2014-09-16 ENCOUNTER — Encounter (HOSPITAL_COMMUNITY): Payer: Self-pay

## 2014-09-16 ENCOUNTER — Telehealth: Payer: Self-pay | Admitting: Family

## 2014-09-16 DIAGNOSIS — E039 Hypothyroidism, unspecified: Secondary | ICD-10-CM | POA: Diagnosis not present

## 2014-09-16 DIAGNOSIS — K219 Gastro-esophageal reflux disease without esophagitis: Secondary | ICD-10-CM | POA: Diagnosis not present

## 2014-09-16 DIAGNOSIS — Z87440 Personal history of urinary (tract) infections: Secondary | ICD-10-CM | POA: Diagnosis not present

## 2014-09-16 DIAGNOSIS — Z91040 Latex allergy status: Secondary | ICD-10-CM | POA: Insufficient documentation

## 2014-09-16 DIAGNOSIS — Z880 Allergy status to penicillin: Secondary | ICD-10-CM | POA: Diagnosis not present

## 2014-09-16 DIAGNOSIS — N6489 Other specified disorders of breast: Secondary | ICD-10-CM | POA: Insufficient documentation

## 2014-09-16 DIAGNOSIS — J45909 Unspecified asthma, uncomplicated: Secondary | ICD-10-CM | POA: Diagnosis not present

## 2014-09-16 DIAGNOSIS — Z79899 Other long term (current) drug therapy: Secondary | ICD-10-CM | POA: Diagnosis not present

## 2014-09-16 DIAGNOSIS — C50412 Malignant neoplasm of upper-outer quadrant of left female breast: Secondary | ICD-10-CM | POA: Diagnosis not present

## 2014-09-16 DIAGNOSIS — E785 Hyperlipidemia, unspecified: Secondary | ICD-10-CM | POA: Insufficient documentation

## 2014-09-16 DIAGNOSIS — Z7951 Long term (current) use of inhaled steroids: Secondary | ICD-10-CM | POA: Diagnosis not present

## 2014-09-16 DIAGNOSIS — M797 Fibromyalgia: Secondary | ICD-10-CM | POA: Insufficient documentation

## 2014-09-16 DIAGNOSIS — H81399 Other peripheral vertigo, unspecified ear: Secondary | ICD-10-CM | POA: Diagnosis not present

## 2014-09-16 DIAGNOSIS — Z853 Personal history of malignant neoplasm of breast: Secondary | ICD-10-CM | POA: Diagnosis not present

## 2014-09-16 DIAGNOSIS — Z85850 Personal history of malignant neoplasm of thyroid: Secondary | ICD-10-CM | POA: Diagnosis not present

## 2014-09-16 DIAGNOSIS — M1990 Unspecified osteoarthritis, unspecified site: Secondary | ICD-10-CM | POA: Diagnosis not present

## 2014-09-16 DIAGNOSIS — G43909 Migraine, unspecified, not intractable, without status migrainosus: Secondary | ICD-10-CM | POA: Diagnosis not present

## 2014-09-16 DIAGNOSIS — R42 Dizziness and giddiness: Secondary | ICD-10-CM | POA: Diagnosis present

## 2014-09-16 LAB — BASIC METABOLIC PANEL
Anion gap: 8 (ref 5–15)
BUN: 16 mg/dL (ref 6–23)
CHLORIDE: 103 mmol/L (ref 96–112)
CO2: 27 mmol/L (ref 19–32)
CREATININE: 0.75 mg/dL (ref 0.50–1.10)
Calcium: 8.8 mg/dL (ref 8.4–10.5)
GFR calc non Af Amer: >90 mL/min (ref >90)
Glucose, Bld: 133 mg/dL — ABNORMAL HIGH (ref 70–99)
Potassium: 3.8 mmol/L (ref 3.5–5.1)
Sodium: 138 mmol/L (ref 135–145)

## 2014-09-16 LAB — CBC WITH DIFFERENTIAL/PLATELET
Basophils Absolute: 0 10*3/uL (ref 0.0–0.1)
Basophils Relative: 0 % (ref 0–1)
EOS PCT: 4 % (ref 0–5)
Eosinophils Absolute: 0.2 10*3/uL (ref 0.0–0.7)
HCT: 37.6 % (ref 36.0–46.0)
Hemoglobin: 11.7 g/dL — ABNORMAL LOW (ref 12.0–15.0)
LYMPHS ABS: 0.8 10*3/uL (ref 0.7–4.0)
LYMPHS PCT: 14 % (ref 12–46)
MCH: 26.7 pg (ref 26.0–34.0)
MCHC: 31.1 g/dL (ref 30.0–36.0)
MCV: 85.6 fL (ref 78.0–100.0)
MONOS PCT: 12 % (ref 3–12)
Monocytes Absolute: 0.7 10*3/uL (ref 0.1–1.0)
Neutro Abs: 3.8 10*3/uL (ref 1.7–7.7)
Neutrophils Relative %: 70 % (ref 43–77)
Platelets: 259 10*3/uL (ref 150–400)
RBC: 4.39 MIL/uL (ref 3.87–5.11)
RDW: 15.4 % (ref 11.5–15.5)
WBC: 5.4 10*3/uL (ref 4.0–10.5)

## 2014-09-16 MED ORDER — HEPARIN SOD (PORK) LOCK FLUSH 100 UNIT/ML IV SOLN
500.0000 [IU] | Freq: Once | INTRAVENOUS | Status: AC
  Administered 2014-09-16: 500 [IU]
  Filled 2014-09-16: qty 5

## 2014-09-16 MED ORDER — MECLIZINE HCL 50 MG PO TABS: 25.0000 mg | ORAL_TABLET | Freq: Three times a day (TID) | ORAL | Status: DC | PRN

## 2014-09-16 MED ORDER — ONDANSETRON HCL 4 MG/2ML IJ SOLN
4.0000 mg | Freq: Once | INTRAMUSCULAR | Status: AC
  Administered 2014-09-16: 4 mg via INTRAVENOUS
  Filled 2014-09-16: qty 2

## 2014-09-16 MED ORDER — ONDANSETRON HCL 8 MG PO TABS: 8.0000 mg | ORAL_TABLET | Freq: Two times a day (BID) | ORAL | Status: DC | PRN

## 2014-09-16 MED ORDER — SODIUM CHLORIDE 0.9 % IV BOLUS (SEPSIS)
500.0000 mL | Freq: Once | INTRAVENOUS | Status: AC
  Administered 2014-09-16: 500 mL via INTRAVENOUS

## 2014-09-16 MED ORDER — MECLIZINE HCL 25 MG PO TABS
25.0000 mg | ORAL_TABLET | Freq: Once | ORAL | Status: AC
  Administered 2014-09-16: 25 mg via ORAL
  Filled 2014-09-16: qty 1

## 2014-09-16 NOTE — Telephone Encounter (Signed)
Pre visit letter for annual exam mailed

## 2014-09-16 NOTE — Discharge Instructions (Signed)
Benign Positional Vertigo Vertigo means you feel like you or your surroundings are moving when they are not. Benign positional vertigo is the most common form of vertigo. Benign means that the cause of your condition is not serious. Benign positional vertigo is more common in older adults. CAUSES  Benign positional vertigo is the result of an upset in the labyrinth system. This is an area in the middle ear that helps control your balance. This may be caused by a viral infection, head injury, or repetitive motion. However, often no specific cause is found. SYMPTOMS  Symptoms of benign positional vertigo occur when you move your head or eyes in different directions. Some of the symptoms may include:  Loss of balance and falls.  Vomiting.  Blurred vision.  Dizziness.  Nausea.  Involuntary eye movements (nystagmus). DIAGNOSIS  Benign positional vertigo is usually diagnosed by physical exam. If the specific cause of your benign positional vertigo is unknown, your caregiver may perform imaging tests, such as magnetic resonance imaging (MRI) or computed tomography (CT). TREATMENT  Your caregiver may recommend movements or procedures to correct the benign positional vertigo. Medicines such as meclizine, benzodiazepines, and medicines for nausea may be used to treat your symptoms. In rare cases, if your symptoms are caused by certain conditions that affect the inner ear, you may need surgery. HOME CARE INSTRUCTIONS   Follow your caregiver's instructions.  Move slowly. Do not make sudden body or head movements.  Avoid driving.  Avoid operating heavy machinery.  Avoid performing any tasks that would be dangerous to you or others during a vertigo episode.  Drink enough fluids to keep your urine clear or pale yellow. SEEK IMMEDIATE MEDICAL CARE IF:   You develop problems with walking, weakness, numbness, or using your arms, hands, or legs.  You have difficulty speaking.  You develop  severe headaches.  Your nausea or vomiting continues or gets worse.  You develop visual changes.  Your family or friends notice any behavioral changes.  Your condition gets worse.  You have a fever.  You develop a stiff neck or sensitivity to light. MAKE SURE YOU:   Understand these instructions.  Will watch your condition.  Will get help right away if you are not doing well or get worse. Document Released: 02/13/2006 Document Revised: 07/31/2011 Document Reviewed: 01/26/2011 Exit[employer] Patient Information 2015 [employer]. This information is not intended to replace advice given to you by your health care provider. Make sure you discuss any questions you have with your health care provider.    

## 2014-09-16 NOTE — ED Provider Notes (Signed)
CSN: [id number]     Arrival date & time 09/16/14  4098 History   First MD Initiated Contact with Patient 09/16/14 1000     Chief Complaint  Patient presents with  . Dizziness  . Cancer   HPI Patient presents to the emergency room with complaints of vertigo and dizziness. Patient states when she turned over in bed this morning she had sudden onset of a sensation that the room was moving.  This resolved somewhat but she continued to have several episodes off and on this morning. Patient was sitting and had another episode when she bent over where she felt that the room was spinning. His had nausea with these symptoms but denies any trouble with vomiting. She does not have any trouble with headache. She does not have any difficulty with her vision right now. She denies any trouble with her speech, balance or coordination.  Patient also has been having some issues with persistent breast edema.  She saw her general surgeon on Monday where she had some fluid aspirated from her left breast. Patient does have a history of breast cancer. She called her surgeon this morning and was told that one of his partners would be seeing the patient in the emergency department. Past Medical History  Diagnosis Date  . GERD (gastroesophageal reflux disease)   . Hyperlipidemia   . History of seizure age 68    as a reaction to Penicillin - no seizures since  . Migraines   . Arthritis     hips and knees  . History of gastric ulcer     as a teenager  . UTI (lower urinary tract infection) 02/17/2014  . Fibromyalgia   . Asthma     daily inhaler, prn inhaler and neb.  Marland Kitchen History of thyroid cancer     s/p thyroidectomy  . Breast cancer 01/2014    left  . Family history of anesthesia complication     twin brother aspirated and died on OR table, per pt.  . Abnormally small mouth   . Dental bridge present     upper front and lower right  . Dental crowns present     x 3  . Hypothyroidism    Past Surgical History   Procedure Laterality Date  . Cholecystectomy  1990  . Breast surgery  2011    left breast biposy  . Appendectomy  2004  . Tonsillectomy and adenoidectomy  2000  . Abdominal hysterectomy  2004    complete  . Thyroidectomy  2000  . Colonoscopy w/ polypectomy  06/2009  . Tumor excision      from thoracic spine  . Incontinence surgery  2004  . Achilles tendon repair Right   . Orif toe fracture Right     great toe  . Knee arthroscopy Bilateral     x 6 each knee  . Ligament repair Right     thumb/wrist  . Eye surgery Right 2013    exc. warts from underneath eyelid  . Breast lumpectomy with needle localization and axillary sentinel lymph node bx Left 02/23/2014    Procedure: BREAST LUMPECTOMY WITH NEEDLE LOCALIZATION AND AXILLARY SENTINEL LYMPH NODE BIOPSY;  Surgeon: Excell Seltzer, MD;  Location: [facility];  Service: General;  Laterality: Left;  . Portacath placement N/A 03/24/2014    Procedure: INSERTION PORT-A-CATH;  Surgeon: Excell Seltzer, MD;  Location: [facility];  Service: General;  Laterality: N/A;   Family History  Problem Relation Age of Onset  .  Alcohol abuse Mother   . Arthritis Mother   . Hypertension Mother   . Bipolar disorder Mother   . Breast cancer Mother 1    unconfirmed  . Lung cancer Mother 27    smoker  . Alcohol abuse Father   . Cancer Father     lung  . Hyperlipidemia Father   . Kidney disease Father   . Diabetes Father   . Arthritis Maternal Grandmother   . Diabetes Paternal Grandmother   . Thyroid cancer Sister 8    type?; currently 62  . Other Sister     ovarian tumor @ 24; TAH/BSO  . Thyroid cancer Brother     dx 9s; currently 83  . Breast cancer Maternal Aunt     dx 21s; deceased 61  . Thyroid cancer Paternal Aunt     All 3 paternal aunts with thyroid ca in 30s/40s  . Lung cancer Paternal Aunt     2 of 3 paternal aunts with lung cancer   History  Substance Use Topics  . Smoking status: Never  Smoker   . Smokeless tobacco: Never Used  . Alcohol Use: No   OB History    No data available     Review of Systems  All other systems reviewed and are negative.     Allergies  Bee venom; Contrast media; Iodine; Latex; Penicillins; Shellfish allergy; Aspirin; Erythromycin; Lidocaine; Symbicort; Codeine; Compazine; Pentazocine; Pentazocine lactate; Sulfamethoxazole; and Sulfonamide derivatives  Home Medications   Prior to Admission medications   Medication Sig Start Date End Date Taking? Authorizing Provider  albuterol (PROVENTIL HFA;VENTOLIN HFA) 108 (90 BASE) MCG/ACT inhaler Inhale 2 puffs into the lungs every 6 (six) hours as needed for wheezing or shortness of breath. Please dispense Proair 09/01/13  Yes Tammy S Parrett, NP  albuterol (PROVENTIL) (5 MG/ML) 0.5% nebulizer solution Take 2.5 mg by nebulization every 6 (six) hours as needed for wheezing or shortness of breath.   Yes Historical Provider, MD  Calcium Carbonate (CALCIUM 500 PO) Take 1 tablet by mouth daily.    Yes Historical Provider, MD  dexamethasone (DECADRON) 4 MG tablet Take 2 tablets by mouth once a day on the day after chemotherapy and then take 2 tablets two times a day for 2 days. Take with food. 03/31/14  Yes Nicholas Lose, MD  diphenhydrAMINE (BENADRYL) 25 MG tablet Take 1 tablet an hour before MRI.  Then take 1 tablet at bedtime as needed, per your usual regimen. 12/26/13  Yes Brunetta Jeans, PA-C  doxycycline (VIBRA-TABS) 100 MG tablet Take 1 tablet (100 mg total) by mouth 2 (two) times daily. Take for 12 days. 09/14/14  Yes [name], MD  DULoxetine (CYMBALTA) 60 MG capsule Take 1 capsule (60 mg total) by mouth daily. 08/31/14  Yes Debbrah Alar, NP  Ergocalciferol (VITAMIN D2) 400 UNITS TABS Take 1 tablet by mouth daily.   Yes Historical Provider, MD  Fluticasone-Salmeterol (ADVAIR DISKUS) 250-50 MCG/DOSE AEPB Inhale 1 puff into the lungs 2 (two) times daily. 08/19/13  Yes Debbrah Alar, NP   levothyroxine (SYNTHROID, LEVOTHROID) 175 MCG tablet Take 175 mcg by mouth daily. Take an additional half tablet =87.38mcg on Monday and Friday (total 262.68mcg) 09/05/14  Yes Historical Provider, MD  meloxicam (MOBIC) 7.5 MG tablet Take 1 tablet (7.5 mg total) by mouth daily. 07/08/14  Yes Debbrah Alar, NP  Multiple Vitamin (MULTIVITAMIN) tablet Take 1 tablet by mouth daily.     Yes Historical Provider, MD  nitroGLYCERIN (NITROSTAT) 0.4 MG  SL tablet Place 1 tablet (0.4 mg total) under the tongue every 5 (five) minutes as needed for chest pain. 04/14/14  Yes Nicholas Lose, MD  Omega-3 Fatty Acids (FISH OIL) 1200 MG CAPS Take 1 capsule by mouth daily.     Yes Historical Provider, MD  oxybutynin (DITROPAN-XL) 5 MG 24 hr tablet Take 1 tablet (5 mg total) by mouth daily. 07/08/14  Yes Debbrah Alar, NP  oxyCODONE-acetaminophen (ROXICET) 5-325 MG per tablet 1-2 tablets q 4h prn 03/24/14  Yes Excell Seltzer, MD  Rivaroxaban (XARELTO STARTER PACK) 15 & 20 MG TBPK Take as directed on package: Start with one 15mg  tablet PO twice daily with food. On Day 22 switch to one 20mg  tablet once daily with food. 08/28/14  Yes Susanne Borders, NP  simvastatin (ZOCOR) 40 MG tablet TAKE 1 TABLET DAILY 07/27/14  Yes Debbrah Alar, NP  traMADol (ULTRAM) 50 MG tablet Take 1 tablet (50 mg total) by mouth every 6 (six) hours as needed. 08/31/14  Yes Nicholas Lose, MD  EPINEPHrine 0.3 mg/0.3 mL IJ SOAJ injection Inject 0.3 mLs (0.3 mg total) into the muscle once. 09/24/13   Debbrah Alar, NP  levothyroxine (SYNTHROID, LEVOTHROID) 150 MCG tablet Take 1 tablet (150 mcg total) by mouth daily. Patient not taking: Reported on 09/16/2014 09/26/13   Debbrah Alar, NP  LORazepam (ATIVAN) 0.5 MG tablet Take 1 tablet (0.5 mg total) by mouth every 6 (six) hours as needed (Nausea or vomiting). Patient not taking: Reported on 09/16/2014 03/31/14   Nicholas Lose, MD  meclizine (ANTIVERT) 50 MG tablet Take 0.5 tablets (25 mg total)  by mouth 3 (three) times daily as needed for dizziness or nausea. 09/16/14   Dorie Rank, MD  nystatin (MYCOSTATIN) 100000 UNIT/ML suspension Take 5 mLs (500,000 Units total) by mouth 4 (four) times daily. Patient not taking: Reported on 09/16/2014 06/02/14   Laurie Panda, NP  omeprazole (PRILOSEC) 40 MG capsule Take 1 capsule (40 mg total) by mouth daily. Patient not taking: Reported on 09/16/2014 04/29/14   Debbrah Alar, NP  ondansetron (ZOFRAN) 8 MG tablet Take 1 tablet (8 mg total) by mouth 2 (two) times daily as needed. Start on the third day after chemotherapy. 09/16/14   Dorie Rank, MD  prochlorperazine (COMPAZINE) 10 MG tablet  03/31/14   Historical Provider, MD   BP 122/74 mmHg  Pulse 75  Temp(Src) 98.2 F (36.8 C) (Oral)  Resp 16  SpO2 96% Physical Exam  Constitutional: She is oriented to person, place, and time. She appears well-developed and well-nourished. No distress.  HENT:  Head: Normocephalic and atraumatic.  Right Ear: External ear normal.  Left Ear: External ear normal.  Mouth/Throat: Oropharynx is clear and moist.  Eyes: Conjunctivae are normal. Right eye exhibits no discharge. Left eye exhibits no discharge. No scleral icterus.  Neck: Neck supple. No tracheal deviation present.  Cardiovascular: Normal rate, regular rhythm and intact distal pulses.   Pulmonary/Chest: Effort normal and breath sounds normal. No stridor. No respiratory distress. She has no wheezes. She has no rales.  Mild erythema of the left breast, no induration or fluctuance, no wounds or drainage noted  Abdominal: Soft. Bowel sounds are normal. She exhibits no distension. There is no tenderness. There is no rebound and no guarding.  Musculoskeletal: She exhibits no edema or tenderness.  Neurological: She is alert and oriented to person, place, and time. She has normal strength. No cranial nerve deficit (no facial droop, extraocular movements intact, no slurred speech) or sensory deficit.  She  exhibits normal muscle tone. She displays no seizure activity. Coordination normal.  No pronator drift bilateral upper extrem, able to hold both legs off bed for 5 seconds, sensation intact in all extremities, no visual field cuts, no left or right sided neglect, normal finger-nose exam bilaterally, normal heel-to-shin bilaterally, horizontal nystagmus noted   Skin: Skin is warm and dry. No rash noted.  Psychiatric: She has a normal mood and affect.  Nursing note and vitals reviewed.   ED Course  Procedures (including critical care time) Labs Review Labs Reviewed  CBC WITH DIFFERENTIAL/PLATELET - Abnormal; Notable for the following:    Hemoglobin 11.7 (*)    All other components within normal limits  BASIC METABOLIC PANEL - Abnormal; Notable for the following:    Glucose, Bld 133 (*)    All other components within normal limits    MDM   Final diagnoses:  Peripheral vertigo, unspecified laterality    The patient's symptoms are consistent with peripheral vertigo.  She has improved with treatment in the ED.  No limb ataxia on exam to suggest stroke.  Pt also seen by general surgery.  Plan for outpatient treatment.  At this time there does not appear to be any evidence of an acute emergency medical condition and the patient appears stable for discharge with appropriate outpatient follow up.     Dorie Rank, MD 09/16/14 1400

## 2014-09-16 NOTE — ED Notes (Signed)
Dr. Zella Richer has just seen her and informed our physicians she will not need to be admitted.

## 2014-09-16 NOTE — ED Notes (Signed)
I phoned radiation oncology on their behalf to let them know pt. Was leaving (she had had a 1300 hours app't. With them) they stated they would "work her in" if they chose to come on over, of which I inform pt. And her husband and for which they thank me.

## 2014-09-16 NOTE — Telephone Encounter (Signed)
[facility] and requested office notes from Dr. Redmond Pulling.  Per Medical Records, they will be faxed this morning.

## 2014-09-16 NOTE — Telephone Encounter (Signed)
Called Michelle Kennedy to see how she is feeling.  Her husband, [name], answered and said they are checking in to the ER.  [name] was having very bad vertigo and called Dr. Dois Davenport office.  She was told to go to the ER for a possible reaction to the antibiotic she was prescribed.  Advised him I would notify Dr. Isidore Moos.

## 2014-09-16 NOTE — Progress Notes (Signed)
She presented to ED because of vertigo.  She has left breast cancer and underwent breast conservation surgery by Dr. Aurelio Jew last October.  She is currently undergoing XRT.  She saw Dr. Redmond Pulling in the office two days ago because of increased left breast redness and swelling.  He felt she had breast cellulitis and also aspirated a seroma and sent the fluid for culture.  She was started on oral antibiotics and the breast was marked.  She presented to the ED because of vertigo which has since improved.  She states the redness of the left breast is decreasing.  PE  General-overweight female in NAD  Left Breast-marking note, slight pinkness receded from marks, no mass  Assessment:  Left Breast Cellulitis-improved after aspiration (culture pending) and starting oral antibiotics.  She has an appointment with Dr. Excell Seltzer tomorrow.  Plan:  Can discharge from ED from our standpoint and follow up with Dr. Excell Seltzer tomorrow.

## 2014-09-16 NOTE — ED Notes (Signed)
She states she experienced sudden onset of dizziness this morning upon "turning over in bed".  She states she had left breast aspirated d/t "fluid buildup" this Mon. By Dr. Redmond Pulling.  She was placed on Doxy. This Mon. Also.  They phoned Dr. Redmond Pulling to inform him of her symptoms, including some left arm edema (non-pitting); and he told them he would phone his on-call partner to have them see pt.  She is in no distress.

## 2014-09-17 ENCOUNTER — Ambulatory Visit: Payer: BLUE CROSS/BLUE SHIELD | Attending: Radiation Oncology | Admitting: Radiation Oncology

## 2014-09-17 ENCOUNTER — Ambulatory Visit
Admission: RE | Admit: 2014-09-17 | Discharge: 2014-09-17 | Disposition: A | Discharge status: Home / Self Care | Payer: BLUE CROSS/BLUE SHIELD | Source: Ambulatory Visit | Attending: Radiation Oncology | Admitting: Radiation Oncology

## 2014-09-17 DIAGNOSIS — C50412 Malignant neoplasm of upper-outer quadrant of left female breast: Secondary | ICD-10-CM | POA: Diagnosis not present

## 2014-09-18 ENCOUNTER — Ambulatory Visit
Admission: RE | Admit: 2014-09-18 | Discharge: 2014-09-18 | Disposition: A | Discharge status: Home / Self Care | Payer: BLUE CROSS/BLUE SHIELD | Source: Ambulatory Visit | Attending: Radiation Oncology | Admitting: Radiation Oncology

## 2014-09-18 ENCOUNTER — Encounter: Payer: BLUE CROSS/BLUE SHIELD | Admitting: Physical Therapy

## 2014-09-18 DIAGNOSIS — C50412 Malignant neoplasm of upper-outer quadrant of left female breast: Secondary | ICD-10-CM | POA: Diagnosis not present

## 2014-09-21 ENCOUNTER — Encounter: Payer: BLUE CROSS/BLUE SHIELD | Admitting: Physical Therapy

## 2014-09-21 ENCOUNTER — Ambulatory Visit
Admission: RE | Admit: 2014-09-21 | Discharge: 2014-09-21 | Disposition: A | Discharge status: Home / Self Care | Payer: BLUE CROSS/BLUE SHIELD | Source: Ambulatory Visit | Attending: Radiation Oncology | Admitting: Radiation Oncology

## 2014-09-21 DIAGNOSIS — C50412 Malignant neoplasm of upper-outer quadrant of left female breast: Secondary | ICD-10-CM | POA: Diagnosis not present

## 2014-09-22 ENCOUNTER — Encounter: Payer: Self-pay | Admitting: Radiation Oncology

## 2014-09-22 ENCOUNTER — Ambulatory Visit
Admission: RE | Admit: 2014-09-22 | Discharge: 2014-09-22 | Disposition: A | Discharge status: Home / Self Care | Payer: BLUE CROSS/BLUE SHIELD | Source: Ambulatory Visit | Attending: Radiation Oncology | Admitting: Radiation Oncology

## 2014-09-22 VITALS — BP 134/90 | HR 93 | Temp 97.9°F | Resp 16 | Wt 256.7 lb

## 2014-09-22 DIAGNOSIS — C50412 Malignant neoplasm of upper-outer quadrant of left female breast: Secondary | ICD-10-CM

## 2014-09-22 NOTE — Progress Notes (Signed)
Michelle Kennedy is tolerating RT relatively well, with the exception of a recent infection to her left breast.  She is scheduled to see Dr. Excell Seltzer in clinic on 09/24/14.  Her most recent "drainage of the infection" was approximately 1 week ago.  She has been on antibiotics since that time and is scheduled to start another round of antibx today.  She endorses low-grade fevers up to 99 degrees and some vertigo, which she attributes to the antibiotics and the infection.   Pain 3/10 left breast Fatigue 5/10, but "manageable"

## 2014-09-22 NOTE — Progress Notes (Signed)
   Weekly Management Note:  Outpatient    ICD-9-CM ICD-10-CM   1. Breast cancer of upper-outer quadrant of left female breast 174.4 C50.412     Current Dose:  16.2Gy  Projected Dose: 60.4 Gy   Narrative:  The patient presents for routine under treatment assessment.  CBCT/MVCT images/Port film x-rays were reviewed.  The chart was checked. Michelle Kennedy is tolerating RT relatively well, with the exception of a recent infection to her left breast.  She is scheduled to see Dr. Excell Seltzer in clinic on 09/24/14.  Her most recent seroma aspiration was approximately 1 week ago.  She has been on antibiotics Doxycycline since that time   She endorses low-grade fevers up to 99 degrees (but this is normal due to fibromyalgia, she reports. She has some vertigo, which she attributes to the antibiotics and the infection.   Pain 3/10 left breast Fatigue 5/10, but "manageable"    Physical Findings:  weight is 256 lb 11.2 oz (116.438 kg). Her oral temperature is 97.9 F (36.6 C). Her blood pressure is 134/90 and her pulse is 93. Her respiration is 16 and oxygen saturation is 98%.   Wt Readings from Last 3 Encounters:  09/14/14 259 lb 8 oz (117.708 kg)  08/28/14 264 lb 14.4 oz (120.158 kg)  08/18/14 262 lb 14.4 oz (119.251 kg)   Her left breast breast has mild erythema (improved) and is much less warm to the touch; still with palpable UOQ seroma  Impression:  The patient is tolerating radiotherapy.  Plan:  Continue radiotherapy as planned. I spoke w/ Dr Excell Seltzer and he feels this was an infected seroma but not an abscess.  He is in agreement with continuing RT and will see her this Thursday. ________________________________   [name], M.D.

## 2014-09-23 ENCOUNTER — Encounter: Payer: BLUE CROSS/BLUE SHIELD | Admitting: Physical Therapy

## 2014-09-23 ENCOUNTER — Ambulatory Visit
Admission: RE | Admit: 2014-09-23 | Discharge: 2014-09-23 | Disposition: A | Discharge status: Home / Self Care | Payer: BLUE CROSS/BLUE SHIELD | Source: Ambulatory Visit | Attending: Radiation Oncology | Admitting: Radiation Oncology

## 2014-09-23 DIAGNOSIS — C50412 Malignant neoplasm of upper-outer quadrant of left female breast: Secondary | ICD-10-CM | POA: Diagnosis not present

## 2014-09-24 ENCOUNTER — Ambulatory Visit
Admission: RE | Admit: 2014-09-24 | Discharge: 2014-09-24 | Disposition: A | Discharge status: Home / Self Care | Payer: BLUE CROSS/BLUE SHIELD | Source: Ambulatory Visit | Attending: Radiation Oncology | Admitting: Radiation Oncology

## 2014-09-24 DIAGNOSIS — C50412 Malignant neoplasm of upper-outer quadrant of left female breast: Secondary | ICD-10-CM | POA: Diagnosis not present

## 2014-09-25 ENCOUNTER — Ambulatory Visit
Admission: RE | Admit: 2014-09-25 | Discharge: 2014-09-25 | Disposition: A | Discharge status: Home / Self Care | Payer: BLUE CROSS/BLUE SHIELD | Source: Ambulatory Visit | Attending: Radiation Oncology | Admitting: Radiation Oncology

## 2014-09-25 ENCOUNTER — Encounter: Payer: BLUE CROSS/BLUE SHIELD | Admitting: Physical Therapy

## 2014-09-25 DIAGNOSIS — C50412 Malignant neoplasm of upper-outer quadrant of left female breast: Secondary | ICD-10-CM | POA: Diagnosis not present

## 2014-09-26 ENCOUNTER — Ambulatory Visit: Payer: BLUE CROSS/BLUE SHIELD

## 2014-09-28 ENCOUNTER — Ambulatory Visit
Admission: RE | Admit: 2014-09-28 | Discharge: 2014-09-28 | Disposition: A | Discharge status: Home / Self Care | Payer: BLUE CROSS/BLUE SHIELD | Source: Ambulatory Visit | Attending: Radiation Oncology | Admitting: Radiation Oncology

## 2014-09-28 VITALS — BP 140/98 | HR 72 | Temp 97.5°F | Resp 12 | Wt 257.9 lb

## 2014-09-28 DIAGNOSIS — C50412 Malignant neoplasm of upper-outer quadrant of left female breast: Secondary | ICD-10-CM

## 2014-09-28 NOTE — Progress Notes (Signed)
She rates her pain as a 4 on a scale of 0-10. constant and aching over left upper breast. Pt complains of fatigue .  Pt left breast- positive for erythema and breast tenderness.  Pt reports edema over left wrist. Pt continues to apply Radiaplex as directed. BP 140/98 mmHg  Pulse 72  Temp(Src) 97.5 F (36.4 C) (Oral)  Resp 12  Wt 257 lb 14.4 oz (116.983 kg)  SpO2 99%

## 2014-09-28 NOTE — Progress Notes (Signed)
    Weekly Management Note:  Outpatient    ICD-9-CM ICD-10-CM   1. Breast cancer of upper-outer quadrant of left female breast 174.4 C50.412     Current Dose:  23.4 Gy  Projected Dose: 60.4 Gy   Narrative:  The patient presents for routine under treatment assessment.  CBCT/MVCT images/Port film x-rays were reviewed.  The chart was checked. She rates her pain as a 4 on a scale of 0-10. constant and aching over left upper breast.Pt complains of fatigue.  Pt continues to apply Radiaplex as directed. Reports that more fluid was aspirated last week by Dr Excell Seltzer from her seroma. It was relatively clear.  No fevers or chills.   BP 140/98 mmHg  Pulse 72  Temp(Src) 97.5 F (36.4 C) (Oral)  Resp 12  Wt 257 lb 14.4 oz (116.983 kg)  SpO2 99%   Physical Findings:  weight is 257 lb 14.4 oz (116.983 kg). Her oral temperature is 97.5 F (36.4 C). Her blood pressure is 140/98 and her pulse is 72. Her respiration is 12 and oxygen saturation is 99%.   Wt Readings from Last 3 Encounters:  09/28/14 257 lb 14.4 oz (116.983 kg)  09/14/14 259 lb 8 oz (117.708 kg)  08/28/14 264 lb 14.4 oz (120.158 kg)   Her left breast breast has mild erythema (still improved) and is not significantly warm to the touch; still with palpable UOQ seroma. LUE forearm edema  Impression:  The patient is tolerating radiotherapy.  Plan:  Continue radiotherapy as planned.   ________________________________   [name], M.D.

## 2014-09-29 ENCOUNTER — Ambulatory Visit
Admission: RE | Admit: 2014-09-29 | Discharge: 2014-09-29 | Disposition: A | Discharge status: Home / Self Care | Payer: BLUE CROSS/BLUE SHIELD | Source: Ambulatory Visit | Attending: Radiation Oncology | Admitting: Radiation Oncology

## 2014-09-29 DIAGNOSIS — C50412 Malignant neoplasm of upper-outer quadrant of left female breast: Secondary | ICD-10-CM | POA: Diagnosis not present

## 2014-09-30 ENCOUNTER — Ambulatory Visit
Admission: RE | Admit: 2014-09-30 | Discharge: 2014-09-30 | Disposition: A | Discharge status: Home / Self Care | Payer: BLUE CROSS/BLUE SHIELD | Source: Ambulatory Visit | Attending: Radiation Oncology | Admitting: Radiation Oncology

## 2014-09-30 DIAGNOSIS — C50412 Malignant neoplasm of upper-outer quadrant of left female breast: Secondary | ICD-10-CM | POA: Diagnosis not present

## 2014-10-01 ENCOUNTER — Ambulatory Visit
Admission: RE | Admit: 2014-10-01 | Discharge: 2014-10-01 | Disposition: A | Discharge status: Home / Self Care | Payer: BLUE CROSS/BLUE SHIELD | Source: Ambulatory Visit | Attending: Radiation Oncology | Admitting: Radiation Oncology

## 2014-10-01 DIAGNOSIS — C50412 Malignant neoplasm of upper-outer quadrant of left female breast: Secondary | ICD-10-CM | POA: Diagnosis not present

## 2014-10-02 ENCOUNTER — Encounter: Payer: Self-pay | Admitting: Radiation Oncology

## 2014-10-02 ENCOUNTER — Ambulatory Visit
Admission: RE | Admit: 2014-10-02 | Discharge: 2014-10-02 | Disposition: A | Discharge status: Home / Self Care | Payer: BLUE CROSS/BLUE SHIELD | Source: Ambulatory Visit | Attending: Radiation Oncology | Admitting: Radiation Oncology

## 2014-10-02 DIAGNOSIS — C50412 Malignant neoplasm of upper-outer quadrant of left female breast: Secondary | ICD-10-CM | POA: Diagnosis not present

## 2014-10-05 ENCOUNTER — Ambulatory Visit
Admission: RE | Admit: 2014-10-05 | Discharge: 2014-10-05 | Disposition: A | Discharge status: Home / Self Care | Payer: BLUE CROSS/BLUE SHIELD | Source: Ambulatory Visit | Attending: Radiation Oncology | Admitting: Radiation Oncology

## 2014-10-05 VITALS — BP 167/96 | HR 70 | Temp 97.5°F | Resp 12 | Wt 259.7 lb

## 2014-10-05 DIAGNOSIS — C50412 Malignant neoplasm of upper-outer quadrant of left female breast: Secondary | ICD-10-CM | POA: Diagnosis not present

## 2014-10-05 NOTE — Progress Notes (Signed)
She rates her pain as a 4 on a scale of 0-10. constant and aching over left breast.  Pt left breast- positive for erythema and breast tenderness.  Pt reports edema over right upper extremity. Numbness in bilateral fingers and toes. Pt continues to apply Radiaplex as directed. BP 167/96 mmHg  Pulse 70  Temp(Src) 97.5 F (36.4 C) (Oral)  Resp 12  Wt 259 lb 11.2 oz (117.799 kg)  SpO2 99%

## 2014-10-05 NOTE — Progress Notes (Signed)
Weekly Management Note:  Site: Left breast/regional lymph nodes Current Dose:  3240  cGy Projected Dose: 5040  cGy  Narrative: The patient is seen today for routine under treatment assessment. CBCT/MVCT images/port films were reviewed. The chart was reviewed.   She is without new complaints today.  She uses Radioplex gel.  Physical Examination:  Filed Vitals:   10/05/14 1114  BP: 167/96  Pulse: 70  Temp: 97.5 F (36.4 C)  Resp: 12  .  Weight: 259 lb 11.2 oz (117.799 kg).  There is mild to moderate erythema along the left breast and clavicular region with no areas of desquamation.  Impression: Tolerating radiation therapy well.  Plan: Continue radiation therapy as planned.

## 2014-10-06 ENCOUNTER — Ambulatory Visit
Admission: RE | Admit: 2014-10-06 | Discharge: 2014-10-06 | Disposition: A | Discharge status: Home / Self Care | Payer: BLUE CROSS/BLUE SHIELD | Source: Ambulatory Visit | Attending: Radiation Oncology | Admitting: Radiation Oncology

## 2014-10-06 DIAGNOSIS — C50412 Malignant neoplasm of upper-outer quadrant of left female breast: Secondary | ICD-10-CM | POA: Diagnosis not present

## 2014-10-07 ENCOUNTER — Ambulatory Visit
Admission: RE | Admit: 2014-10-07 | Discharge: 2014-10-07 | Disposition: A | Discharge status: Home / Self Care | Payer: BLUE CROSS/BLUE SHIELD | Source: Ambulatory Visit | Attending: Radiation Oncology | Admitting: Radiation Oncology

## 2014-10-07 ENCOUNTER — Encounter: Payer: BLUE CROSS/BLUE SHIELD | Admitting: Family

## 2014-10-07 DIAGNOSIS — C50412 Malignant neoplasm of upper-outer quadrant of left female breast: Secondary | ICD-10-CM | POA: Diagnosis not present

## 2014-10-08 ENCOUNTER — Ambulatory Visit
Admission: RE | Admit: 2014-10-08 | Discharge: 2014-10-08 | Disposition: A | Discharge status: Home / Self Care | Payer: BLUE CROSS/BLUE SHIELD | Source: Ambulatory Visit | Attending: Radiation Oncology | Admitting: Radiation Oncology

## 2014-10-08 DIAGNOSIS — C50412 Malignant neoplasm of upper-outer quadrant of left female breast: Secondary | ICD-10-CM | POA: Diagnosis not present

## 2014-10-09 ENCOUNTER — Ambulatory Visit
Admission: RE | Admit: 2014-10-09 | Discharge: 2014-10-09 | Disposition: A | Discharge status: Home / Self Care | Payer: BLUE CROSS/BLUE SHIELD | Source: Ambulatory Visit | Attending: Radiation Oncology | Admitting: Radiation Oncology

## 2014-10-09 DIAGNOSIS — C50412 Malignant neoplasm of upper-outer quadrant of left female breast: Secondary | ICD-10-CM | POA: Diagnosis not present

## 2014-10-10 ENCOUNTER — Ambulatory Visit: Payer: BLUE CROSS/BLUE SHIELD

## 2014-10-12 ENCOUNTER — Ambulatory Visit
Admission: RE | Admit: 2014-10-12 | Discharge: 2014-10-12 | Disposition: A | Discharge status: Home / Self Care | Payer: BLUE CROSS/BLUE SHIELD | Source: Ambulatory Visit | Attending: Radiation Oncology | Admitting: Radiation Oncology

## 2014-10-12 ENCOUNTER — Encounter: Payer: Self-pay | Admitting: Radiation Oncology

## 2014-10-12 VITALS — BP 128/84 | HR 72 | Temp 97.9°F | Resp 16 | Ht 67.0 in | Wt 256.3 lb

## 2014-10-12 DIAGNOSIS — C50412 Malignant neoplasm of upper-outer quadrant of left female breast: Secondary | ICD-10-CM

## 2014-10-12 NOTE — Progress Notes (Signed)
Michelle Kennedy has completed 23 fractions to her left breast and subclavian area.  She denies pain.  She denies fatigue and is trying to walk daily.  The skin on her breast and upper chest/neck area is red with a rash like appearance.  She reports having some peeling around her nipple area.  She is using radiaplex.  BP 128/84 mmHg  Pulse 72  Temp(Src) 97.9 F (36.6 C) (Oral)  Resp 16  Ht 5\' 7"  (1.702 m)  Wt 256 lb 4.8 oz (116.257 kg)  BMI 40.13 kg/m2

## 2014-10-12 NOTE — Progress Notes (Signed)
    Weekly Management Note:  Outpatient    ICD-9-CM ICD-10-CM   1. Breast cancer of upper-outer quadrant of left female breast 174.4 C50.412     Current Dose:  41.4 Gy  Projected Dose: 60.4 Gy   Narrative:  The patient presents for routine under treatment assessment.  CBCT/MVCT images/Port film x-rays were reviewed.  The chart was checked. She is doing relatively well, and having her seroma aspirated weekly.  BP 128/84 mmHg  Pulse 72  Temp(Src) 97.9 F (36.6 C) (Oral)  Resp 16  Ht 5\' 7"  (1.702 m)  Wt 256 lb 4.8 oz (116.257 kg)  BMI 40.13 kg/m2   Physical Findings:  height is 5\' 7"  (1.702 m) and weight is 256 lb 4.8 oz (116.257 kg). Her oral temperature is 97.9 F (36.6 C). Her blood pressure is 128/84 and her pulse is 72. Her respiration is 16.   Wt Readings from Last 3 Encounters:  10/12/14 256 lb 4.8 oz (116.257 kg)  10/05/14 259 lb 11.2 oz (117.799 kg)  09/14/14 259 lb 8 oz (117.708 kg)   Erythema of left breast and SCV region, still with palpable UOQ seroma. LUE forearm edema  Impression:  The patient is tolerating radiotherapy.  Plan:  Continue radiotherapy as planned. I will contact PT about arm edema, in setting of blood clot, to determine when she should f/u w/ them  ________________________________   [name], M.D.

## 2014-10-13 ENCOUNTER — Ambulatory Visit
Admission: RE | Admit: 2014-10-13 | Discharge: 2014-10-13 | Disposition: A | Discharge status: Home / Self Care | Payer: BLUE CROSS/BLUE SHIELD | Source: Ambulatory Visit | Attending: Radiation Oncology | Admitting: Radiation Oncology

## 2014-10-13 DIAGNOSIS — C50412 Malignant neoplasm of upper-outer quadrant of left female breast: Secondary | ICD-10-CM | POA: Diagnosis not present

## 2014-10-14 ENCOUNTER — Ambulatory Visit
Admission: RE | Admit: 2014-10-14 | Discharge: 2014-10-14 | Disposition: A | Discharge status: Home / Self Care | Payer: BLUE CROSS/BLUE SHIELD | Source: Ambulatory Visit | Attending: Radiation Oncology | Admitting: Radiation Oncology

## 2014-10-14 DIAGNOSIS — C50412 Malignant neoplasm of upper-outer quadrant of left female breast: Secondary | ICD-10-CM | POA: Diagnosis not present

## 2014-10-15 ENCOUNTER — Ambulatory Visit
Admission: RE | Admit: 2014-10-15 | Discharge: 2014-10-15 | Disposition: A | Discharge status: Home / Self Care | Payer: BLUE CROSS/BLUE SHIELD | Source: Ambulatory Visit | Attending: Radiation Oncology | Admitting: Radiation Oncology

## 2014-10-15 DIAGNOSIS — C50412 Malignant neoplasm of upper-outer quadrant of left female breast: Secondary | ICD-10-CM | POA: Diagnosis not present

## 2014-10-16 ENCOUNTER — Ambulatory Visit
Admission: RE | Admit: 2014-10-16 | Discharge: 2014-10-16 | Disposition: A | Discharge status: Home / Self Care | Payer: BLUE CROSS/BLUE SHIELD | Source: Ambulatory Visit | Attending: Radiation Oncology | Admitting: Radiation Oncology

## 2014-10-16 VITALS — BP 144/84 | HR 95 | Temp 97.8°F | Wt 256.1 lb

## 2014-10-16 DIAGNOSIS — C50412 Malignant neoplasm of upper-outer quadrant of left female breast: Secondary | ICD-10-CM | POA: Diagnosis not present

## 2014-10-16 NOTE — Progress Notes (Signed)
Weekly assessment of radiation to left breast.Completed 27 of 33 treatments.Denies pain.Skin is markedly red/discolored with rash in left upper chest.States she isn't itching.Voices no concerns or problems.

## 2014-10-16 NOTE — Progress Notes (Signed)
    Weekly Management Note:  Outpatient    ICD-9-CM ICD-10-CM   1. Breast cancer of upper-outer quadrant of left female breast 174.4 C50.412     Current Dose:  48.6 Gy  Projected Dose: 60.4 Gy   Narrative:  The patient presents for routine under treatment assessment.  CBCT/MVCT images/Port film x-rays were reviewed.  The chart was checked.  Denies pain. Skin is markedly red/discolored with a rash in left upper chest. She states that it isn't itching. She voices no other concerns at this time.  BP 144/84 mmHg  Pulse 95  Temp(Src) 97.8 F (36.6 C)  Wt 256 lb 1.6 oz (116.166 kg)   Physical Findings:  weight is 256 lb 1.6 oz (116.166 kg). Her temperature is 97.8 F (36.6 C). Her blood pressure is 144/84 and her pulse is 95.   Wt Readings from Last 3 Encounters:  10/16/14 256 lb 1.6 oz (116.166 kg)  10/05/14 259 lb 11.2 oz (117.799 kg)  09/14/14 259 lb 8 oz (117.708 kg)   Diffuse bright erythema through the left breast and left lower neck (SCV). Dry desquamation in the left axilla. Left arm edema  Impression:  The patient is tolerating radiotherapy.  Plan:  Continue radiotherapy as planned. I will talk to Dr. Lindi Adie about when he feels the pt will be safe for further physical therapy to her left arm. Recommended neosporin if the desquamation becomes moist.  This document serves as a record of services personally performed by [name], MD. It was created on her behalf by Darcus Austin, a trained medical scribe. The creation of this record is based on the scribe's personal observations and the provider's statements to them. This document has been checked and approved by the attending provider.     ________________________________   [name], M.D.

## 2014-10-18 ENCOUNTER — Other Ambulatory Visit: Payer: Self-pay | Admitting: Family

## 2014-10-20 ENCOUNTER — Other Ambulatory Visit (HOSPITAL_BASED_OUTPATIENT_CLINIC_OR_DEPARTMENT_OTHER): Payer: BLUE CROSS/BLUE SHIELD

## 2014-10-20 ENCOUNTER — Ambulatory Visit (HOSPITAL_BASED_OUTPATIENT_CLINIC_OR_DEPARTMENT_OTHER): Payer: BLUE CROSS/BLUE SHIELD | Admitting: Hematology and Oncology

## 2014-10-20 ENCOUNTER — Ambulatory Visit
Admission: RE | Admit: 2014-10-20 | Discharge: 2014-10-20 | Disposition: A | Discharge status: Home / Self Care | Payer: BLUE CROSS/BLUE SHIELD | Source: Ambulatory Visit | Attending: Radiation Oncology | Admitting: Radiation Oncology

## 2014-10-20 ENCOUNTER — Other Ambulatory Visit: Payer: Self-pay | Admitting: Radiation Oncology

## 2014-10-20 VITALS — BP 153/79 | HR 70 | Temp 97.9°F | Resp 18 | Ht 67.0 in | Wt 258.8 lb

## 2014-10-20 DIAGNOSIS — C50412 Malignant neoplasm of upper-outer quadrant of left female breast: Secondary | ICD-10-CM

## 2014-10-20 DIAGNOSIS — Z170 Estrogen receptor positive status [ER+]: Secondary | ICD-10-CM

## 2014-10-20 DIAGNOSIS — Z7901 Long term (current) use of anticoagulants: Secondary | ICD-10-CM

## 2014-10-20 DIAGNOSIS — I890 Lymphedema, not elsewhere classified: Secondary | ICD-10-CM | POA: Diagnosis not present

## 2014-10-20 DIAGNOSIS — I82402 Acute embolism and thrombosis of unspecified deep veins of left lower extremity: Secondary | ICD-10-CM

## 2014-10-20 DIAGNOSIS — Z79811 Long term (current) use of aromatase inhibitors: Secondary | ICD-10-CM

## 2014-10-20 DIAGNOSIS — Z86718 Personal history of other venous thrombosis and embolism: Secondary | ICD-10-CM | POA: Diagnosis not present

## 2014-10-20 LAB — CBC WITH DIFFERENTIAL/PLATELET
BASO%: 0.8 % (ref 0.0–2.0)
BASOS ABS: 0 10*3/uL (ref 0.0–0.1)
EOS%: 3 % (ref 0.0–7.0)
Eosinophils Absolute: 0.1 10*3/uL (ref 0.0–0.5)
HEMATOCRIT: 41.9 % (ref 34.8–46.6)
HGB: 13.3 g/dL (ref 11.6–15.9)
LYMPH#: 0.8 10*3/uL — AB (ref 0.9–3.3)
LYMPH%: 18 % (ref 14.0–49.7)
MCH: 25.8 pg (ref 25.1–34.0)
MCHC: 31.7 g/dL (ref 31.5–36.0)
MCV: 81.3 fL (ref 79.5–101.0)
MONO#: 0.5 10*3/uL (ref 0.1–0.9)
MONO%: 10 % (ref 0.0–14.0)
NEUT%: 68.2 % (ref 38.4–76.8)
NEUTROS ABS: 3.2 10*3/uL (ref 1.5–6.5)
Platelets: 238 10*3/uL (ref 145–400)
RBC: 5.16 10*6/uL (ref 3.70–5.45)
RDW: 16.2 % — ABNORMAL HIGH (ref 11.2–14.5)
WBC: 4.6 10*3/uL (ref 3.9–10.3)

## 2014-10-20 LAB — COMPREHENSIVE METABOLIC PANEL (CC13)
ALBUMIN: 3.4 g/dL — AB (ref 3.5–5.0)
ALK PHOS: 64 U/L (ref 40–150)
ALT: 31 U/L (ref 0–55)
AST: 29 U/L (ref 5–34)
Anion Gap: 8 mEq/L (ref 3–11)
BUN: 12.8 mg/dL (ref 7.0–26.0)
CO2: 27 meq/L (ref 22–29)
Calcium: 9.9 mg/dL (ref 8.4–10.4)
Chloride: 107 mEq/L (ref 98–109)
Creatinine: 0.8 mg/dL (ref 0.6–1.1)
EGFR: 79 mL/min/{1.73_m2} — ABNORMAL LOW (ref >90)
GLUCOSE: 97 mg/dL (ref 70–140)
POTASSIUM: 4.7 meq/L (ref 3.5–5.1)
SODIUM: 141 meq/L (ref 136–145)
Total Bilirubin: 0.39 mg/dL (ref 0.20–1.20)
Total Protein: 6.1 g/dL — ABNORMAL LOW (ref 6.4–8.3)

## 2014-10-20 MED ORDER — RIVAROXABAN 20 MG PO TABS
20.0000 mg | ORAL_TABLET | Freq: Every day | ORAL | Status: DC
Start: 2014-10-20 — End: 2015-06-14

## 2014-10-20 NOTE — Assessment & Plan Note (Addendum)
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 M0 stage IIB  Chemotherapy summary: Adjuvant chemotherapy Completed 4 cycles of dose dense Adriamycin and Cytoxan. 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 She will return back to see me after radiation is complete.

## 2014-10-20 NOTE — Progress Notes (Signed)
Patient Care Team: Debbrah Alar, NP as PCP - General (Internal Medicine) Excell Seltzer, MD as Consulting Physician (General Surgery) Nicholas Lose, MD as Consulting Physician (Hematology and Oncology) [name], MD as Attending Physician (Radiation Oncology)  DIAGNOSIS: Breast cancer of upper-outer quadrant of left female breast   Staging form: Breast, AJCC 7th Edition     Clinical: Stage IIB (T3, N0, cM0) - Unsigned       Staging comments: Staged at breast conference 12/31/13.      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 with lymphovascular invasion margins negative one out of 4 SLN positive, ER 99%, PR 100%, HER-2 negative ratio 0.74, T2, N1, M0 stage IIB   03/19/2014 PET scan No evidence of distant metastatic disease   03/31/2014 - 08/18/2014 Chemotherapy Adjuvant chemotherapy with dose dense Adriamycin Cytoxan x4 followed by Abraxane weekly x12   08/28/2014 Imaging Left brachial vein thrombus   09/11/2014 -  Radiation Therapy Adjuvant radiation therapy    CHIEF COMPLIANT: Radiation therapy to complete in one week  INTERVAL HISTORY: Michelle Kennedy is a 59 year old with above-mentioned history of left breast cancer who completed adjuvant chemotherapy followed by radiation. She is currently receiving radiation and has another week of radiation left. There is some redness to the chest wall and axilla.  Denies any fevers or chills. She had read the consent form for [name] clinical trial and is ready to sign the consent form. She had DVT in the left arm and is currently on xarelto.  REVIEW OF SYSTEMS:   Constitutional: Denies fevers, chills or abnormal weight loss Eyes: Denies blurriness of vision Ears, nose, mouth, throat, and face: Denies mucositis or sore throat Respiratory: Denies cough, dyspnea or wheezes Cardiovascular: Denies palpitation, chest discomfort  Gastrointestinal:  Denies nausea, heartburn or change in bowel habits Skin: Denies abnormal skin rashes Lymphatics: Denies new lymphadenopathy or easy bruising Neurological:Denies numbness, tingling or new weaknesses Behavioral/Psych: Mood is stable, no new changes  Breast: Redness in the left breast and axilla Left arm swelling from lymphedema All other systems were reviewed with the patient and are negative.  I have reviewed the past medical history, past surgical history, social history and family history with the patient and they are unchanged from previous note.  ALLERGIES:  is allergic to bee venom; contrast media; iodine; latex; penicillins; shellfish allergy; aspirin; erythromycin; lidocaine; symbicort; codeine; compazine; pentazocine lactate; and sulfonamide derivatives.  MEDICATIONS:  Current Outpatient Prescriptions  Medication Sig Dispense Refill  . albuterol (PROVENTIL) (5 MG/ML) 0.5% nebulizer solution Take 2.5 mg by nebulization every 6 (six) hours as needed for wheezing or shortness of breath.    . Calcium Carbonate (CALCIUM 500 PO) Take 1 tablet by mouth daily.     Marland Kitchen dexamethasone (DECADRON) 4 MG tablet Take 2 tablets by mouth once a day on the day after chemotherapy and then take 2 tablets two times a day for 2 days. Take with  food. (Patient not taking: Reported on 10/12/2014) 30 tablet 1  . diphenhydrAMINE (BENADRYL) 25 MG tablet Take 1 tablet an hour before MRI.  Then take 1 tablet at bedtime as needed, per  your usual regimen. 30 tablet 0  . doxycycline (VIBRA-TABS) 100 MG tablet Take 1 tablet (100 mg total) by mouth 2 (two) times daily. Take for 12 days. (Patient not taking: Reported on 10/12/2014) 24 tablet 0  . DULoxetine (CYMBALTA) 60 MG capsule Take 1 capsule (60 mg total) by mouth daily. (Patient not taking: Reported on 10/16/2014) 90 capsule 0  . EPINEPHrine 0.3 mg/0.3 mL IJ SOAJ injection Inject 0.3 mLs (0.3 mg total) into the muscle once. (Patient not taking: Reported on 10/12/2014) 2 Device 0  . Ergocalciferol (VITAMIN D2) 400 UNITS TABS Take 1 tablet by mouth daily.    . Fluticasone-Salmeterol (ADVAIR DISKUS) 250-50 MCG/DOSE AEPB Inhale 1 puff into the lungs 2 (two) times daily. 180 each 1  . levothyroxine (SYNTHROID, LEVOTHROID) 150 MCG tablet Take 1 tablet (150 mcg total) by mouth daily. (Patient not taking: Reported on 09/16/2014) 30 tablet 1  . levothyroxine (SYNTHROID, LEVOTHROID) 175 MCG tablet Take 175 mcg by mouth daily. Take an additional half tablet =87.73mg on Monday and Friday (total 262.529m)    . LORazepam (ATIVAN) 0.5 MG tablet Take 1 tablet (0.5 mg total) by mouth every 6 (six) hours as needed (Nausea or vomiting). (Patient not taking: Reported on 09/16/2014) 30 tablet 0  . meclizine (ANTIVERT) 50 MG tablet Take 0.5 tablets (25 mg total) by mouth 3 (three) times daily as needed for dizziness or nausea. 21 tablet 0  . meloxicam (MOBIC) 7.5 MG tablet Take 1 tablet (7.5 mg total) by mouth daily. 90 tablet 1  . Multiple Vitamin (MULTIVITAMIN) tablet Take 1 tablet by mouth daily.      . nitroGLYCERIN (NITROSTAT) 0.4 MG SL tablet Place 1 tablet (0.4 mg total) under the tongue every 5 (five) minutes as needed for chest pain. 30 tablet 0  . nystatin (MYCOSTATIN) 100000 UNIT/ML suspension Take 5 mLs (500,000 Units total) by mouth 4 (four) times daily. (Patient not taking: Reported on 09/16/2014) 240 mL 0  . Omega-3 Fatty Acids (FISH OIL) 1200 MG CAPS Take 1 capsule by mouth daily.      . Marland Kitchenomeprazole (PRILOSEC) 40 MG capsule Take 1 capsule (40 mg total) by mouth daily. (Patient not taking: Reported on 09/16/2014) 90 capsule 1  . ondansetron (ZOFRAN) 8 MG tablet Take 1 tablet (8 mg total) by mouth 2 (two) times daily as needed. Start on the third day after chemotherapy. (Patient not taking: Reported on 10/12/2014) 30 tablet 1  . oxybutynin (DITROPAN-XL) 5 MG 24 hr tablet Take 1 tablet (5 mg total) by mouth daily. 90 tablet 1  . oxyCODONE-acetaminophen (ROXICET) 5-325 MG per tablet 1-2 tablets q 4h prn (Patient not taking: Reported on 10/12/2014) 20 tablet 0  . PROAIR HFA 108 (90 BASE) MCG/ACT inhaler INHALE 2 PUFFS INTO THE LUNGS EVERY 6 HOURS AS NEEDED 25.5 g 1  . prochlorperazine (COMPAZINE) 10 MG tablet   1  . Rivaroxaban (XARELTO STARTER PACK) 15 & 20 MG TBPK Take as directed on package: Start with one 1579mablet PO twice daily with food. On Day 22 switch to one 60m32mblet once daily with food. (Patient not taking: Reported on 10/12/2014) 51 each 0  . simvastatin (ZOCOR) 40 MG tablet TAKE 1 TABLET DAILY 90 tablet 1  . traMADol (ULTRAM) 50 MG tablet Take 1 tablet (  50 mg total) by mouth every 6 (six) hours as needed. (Patient not taking: Reported on 10/12/2014) 30 tablet 0   No current facility-administered medications for this visit.    PHYSICAL EXAMINATION: ECOG PERFORMANCE STATUS: 1 - Symptomatic but completely ambulatory  There were no vitals filed for this visit. There were no vitals filed for this visit.  GENERAL:alert, no distress and comfortable SKIN: skin color, texture, turgor are normal, no rashes or significant lesions EYES: normal, Conjunctiva are pink and non-injected, sclera clear OROPHARYNX:no exudate, no erythema and lips, buccal mucosa, and tongue normal  NECK: supple, thyroid normal size, non-tender, without nodularity LYMPH:  no palpable lymphadenopathy in the cervical, axillary or inguinal LUNGS: clear to auscultation and percussion with normal breathing  effort HEART: regular rate & rhythm and no murmurs and no lower extremity edema ABDOMEN:abdomen soft, non-tender and normal bowel sounds Musculoskeletal:no cyanosis of digits and no clubbing  NEURO: alert & oriented x 3 with fluent speech, no focal motor/sensory deficits Left arm swelling from lymphedema  LABORATORY DATA:  I have reviewed the data as listed   Chemistry      Component Value Date/Time   NA 138 09/16/2014 1140   NA 143 08/18/2014 1108   K 3.8 09/16/2014 1140   K 4.5 08/18/2014 1108   CL 103 09/16/2014 1140   CO2 27 09/16/2014 1140   CO2 28 08/18/2014 1108   BUN 16 09/16/2014 1140   BUN 7.8 08/18/2014 1108   CREATININE 0.75 09/16/2014 1140   CREATININE 0.8 08/18/2014 1108   CREATININE 0.83 05/31/2011 0905      Component Value Date/Time   CALCIUM 8.8 09/16/2014 1140   CALCIUM 9.5 08/18/2014 1108   ALKPHOS 69 08/18/2014 1108   ALKPHOS 52 08/11/2013 0918   AST 35* 08/18/2014 1108   AST 19 08/11/2013 0918   ALT 48 08/18/2014 1108   ALT 24 08/11/2013 0918   BILITOT 0.35 08/18/2014 1108   BILITOT 0.4 08/11/2013 0918       Lab Results  Component Value Date   WBC 5.4 09/16/2014   HGB 11.7* 09/16/2014   HCT 37.6 09/16/2014   MCV 85.6 09/16/2014   PLT 259 09/16/2014   NEUTROABS 3.8 09/16/2014   ASSESSMENT & PLAN:  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 M0 stage IIB  Chemotherapy summary: Adjuvant chemotherapy Completed 4 cycles of dose dense Adriamycin and Cytoxan. Followed by Abraxane X 12 completed 08/18/14 Toxicities of chemotherapy: Chemotherapy-induced neutropenia: reduced the dosage of Abraxane to 85 mg/m from week 3 ,Chemotherapy-induced diarrhea resolved with Imodium, Esophageal spasms diminished ,Fatigue related to chemotherapy, Alopecia  DVT left brachial vein: Currently on  xarelto, tolerating it well Lymphedema left arm: Recommended that she can reinitiate physical therapy for lymphedema  Treatment plan: Adjuvant antiestrogen therapy with anastrozole once daily for 5 years [name] Study: I recommended that she participate in clinical trial evaluating the combination of Palbociclib with antiestrogen therapy. Patient read the consent and is ready to sign the paperwork to get started on the trial. We will make further appointments based upon the clinical trial requirements  No orders of the defined types were placed in this encounter.   The patient has a good understanding of the overall plan. she agrees with it. she will call with any problems that may develop before the next visit here.  Rulon Eisenmenger, MD

## 2014-10-21 ENCOUNTER — Ambulatory Visit
Admission: RE | Admit: 2014-10-21 | Discharge: 2014-10-21 | Disposition: A | Discharge status: Home / Self Care | Payer: BLUE CROSS/BLUE SHIELD | Source: Ambulatory Visit | Attending: Radiation Oncology | Admitting: Radiation Oncology

## 2014-10-21 ENCOUNTER — Telehealth: Payer: Self-pay | Admitting: *Deleted

## 2014-10-21 DIAGNOSIS — C50412 Malignant neoplasm of upper-outer quadrant of left female breast: Secondary | ICD-10-CM

## 2014-10-21 MED ORDER — RADIAPLEXRX EX GEL
Freq: Once | CUTANEOUS | Status: AC
  Administered 2014-10-21: 13:00:00 via TOPICAL

## 2014-10-21 NOTE — Telephone Encounter (Signed)
CALLED PATIENT TO INFORM OF PT APPT. ON 10-29-14 - ARRIVAL TIME - 3:10 PM WITH TERESA BROWN - ADDRESS - [address]- [address] - [phone], LVM FOR A RETURN CALL

## 2014-10-22 ENCOUNTER — Encounter: Payer: Self-pay | Admitting: Radiation Oncology

## 2014-10-22 ENCOUNTER — Ambulatory Visit: Payer: BLUE CROSS/BLUE SHIELD

## 2014-10-22 ENCOUNTER — Ambulatory Visit
Admission: RE | Admit: 2014-10-22 | Discharge: 2014-10-22 | Disposition: A | Discharge status: Home / Self Care | Payer: BLUE CROSS/BLUE SHIELD | Source: Ambulatory Visit | Attending: Radiation Oncology | Admitting: Radiation Oncology

## 2014-10-22 VITALS — BP 155/86 | HR 77 | Temp 97.7°F | Ht 67.0 in | Wt 260.4 lb

## 2014-10-22 DIAGNOSIS — C50412 Malignant neoplasm of upper-outer quadrant of left female breast: Secondary | ICD-10-CM

## 2014-10-22 NOTE — Progress Notes (Addendum)
Michelle Kennedy has worsening of left [place] region and grades pain as a level 8/10 in this area.  Bright erythema of the left breast with rash-like apperance.

## 2014-10-22 NOTE — Progress Notes (Signed)
Weekly Management Note:  Site: Left breast boost Current Dose:  400  cGy Projected Dose: 1000  cGy  Narrative: The patient is seen today for routine under treatment assessment. CBCT/MVCT images/port films were reviewed. The chart was reviewed.   She seen today complaining of discomfort along the left clavicular region from a moist desquamation.  Physical Examination:  Filed Vitals:   10/22/14 1151  BP: 155/86  Pulse: 77  Temp: 97.7 F (36.5 C)  .  Weight: 260 lb 6.4 oz (118.117 kg).  There is erythema and partial reepithelialization of her left clavicular region.  No obvious infection.  Impression: Tolerating radiation therapy well, except for history of moist desquamation with partial reepithelialization.  I will have her apply Aquaphor ointment 2-3 times a day.  I told her she could take an NSAID for discomfort.  Plan: Continue radiation therapy as planned.

## 2014-10-23 ENCOUNTER — Ambulatory Visit
Admission: RE | Admit: 2014-10-23 | Discharge: 2014-10-23 | Disposition: A | Discharge status: Home / Self Care | Payer: BLUE CROSS/BLUE SHIELD | Source: Ambulatory Visit | Attending: Radiation Oncology | Admitting: Radiation Oncology

## 2014-10-23 DIAGNOSIS — C50412 Malignant neoplasm of upper-outer quadrant of left female breast: Secondary | ICD-10-CM | POA: Diagnosis not present

## 2014-10-24 ENCOUNTER — Ambulatory Visit: Payer: BLUE CROSS/BLUE SHIELD

## 2014-10-26 ENCOUNTER — Encounter: Payer: Self-pay | Admitting: Radiation Oncology

## 2014-10-26 ENCOUNTER — Ambulatory Visit
Admission: RE | Admit: 2014-10-26 | Discharge: 2014-10-26 | Disposition: A | Discharge status: Home / Self Care | Payer: BLUE CROSS/BLUE SHIELD | Source: Ambulatory Visit | Attending: Radiation Oncology | Admitting: Radiation Oncology

## 2014-10-26 VITALS — BP 151/89 | HR 88 | Temp 98.2°F | Resp 16 | Ht 67.0 in | Wt 256.6 lb

## 2014-10-26 DIAGNOSIS — C50412 Malignant neoplasm of upper-outer quadrant of left female breast: Secondary | ICD-10-CM

## 2014-10-26 NOTE — Progress Notes (Signed)
    Weekly Management Note:  Outpatient    ICD-9-CM ICD-10-CM   1. Breast cancer of upper-outer quadrant of left female breast 174.4 C50.412     Current Dose:  58.4 Gy  Projected Dose: 60.4 Gy   Narrative:  The patient presents for routine under treatment assessment.  CBCT/MVCT images/Port film x-rays were reviewed.  The chart was checked.  She reports pain in her left breast due to skin irritation and a seroma.  She reports fatigue in the afternoons.  The skin on her left breast is red with healing peeling areas under her breast and under her left arm.  Her left subclavian area has peeling and she reports that it is "oozing."  She is using radiaplex on her left breast and Aquaphor and neosporin on her subclavian area.  She has been given a follow up appointment card.    BP 151/89 mmHg  Pulse 88  Temp(Src) 98.2 F (36.8 C) (Oral)  Resp 16  Ht 5\' 7"  (1.702 m)  Wt 256 lb 9.6 oz (116.393 kg)  BMI 40.18 kg/m2   Physical Findings:  height is 5\' 7"  (1.702 m) and weight is 256 lb 9.6 oz (116.393 kg). Her oral temperature is 98.2 F (36.8 C). Her blood pressure is 151/89 and her pulse is 88. Her respiration is 16.   Wt Readings from Last 3 Encounters:  10/22/14 260 lb 6.4 oz (118.117 kg)  10/20/14 258 lb 12.8 oz (117.391 kg)  10/16/14 256 lb 1.6 oz (116.166 kg)   Diffuse bright erythema through the left breast and resolving moist desquamation of left lower neck (SCV). Dry desquamation in the left axilla and IM fold. Left arm edema  Impression:  The patient is tolerating radiotherapy.  Plan:  Continue radiotherapy as planned.  Cleared by Dr Lindi Adie for PT. Appt this week. F/u card given to see me in 44mo ________________________________   [name], M.D.

## 2014-10-26 NOTE — Progress Notes (Signed)
Michelle Kennedy has completed 32 fractions to her left breast and subclavian area.  She reports pain in her left breast due to skin irritation and a seroma.  She reports fatigue in the afternoons.  The skin on her left breast is red with healing peeling areas under her breast and under her left arm.  Her left subclavian area has peeling and she reports that it is "oozing."  She is using radiaplex on her left breast and Aquaphor and neosporin on her subclavian area.  She has been given a follow up appointment card.  BP 151/89 mmHg  Pulse 88  Temp(Src) 98.2 F (36.8 C) (Oral)  Resp 16  Ht 5\' 7"  (1.702 m)  Wt 256 lb 9.6 oz (116.393 kg)  BMI 40.18 kg/m2

## 2014-10-27 ENCOUNTER — Ambulatory Visit
Admission: RE | Admit: 2014-10-27 | Discharge: 2014-10-27 | Disposition: A | Discharge status: Home / Self Care | Payer: BLUE CROSS/BLUE SHIELD | Source: Ambulatory Visit | Attending: Radiation Oncology | Admitting: Radiation Oncology

## 2014-10-27 ENCOUNTER — Ambulatory Visit: Payer: BLUE CROSS/BLUE SHIELD

## 2014-10-27 ENCOUNTER — Encounter: Payer: Self-pay | Admitting: Radiation Oncology

## 2014-10-27 DIAGNOSIS — C50412 Malignant neoplasm of upper-outer quadrant of left female breast: Secondary | ICD-10-CM | POA: Diagnosis not present

## 2014-10-28 ENCOUNTER — Ambulatory Visit: Payer: BLUE CROSS/BLUE SHIELD

## 2014-10-29 ENCOUNTER — Ambulatory Visit: Payer: BLUE CROSS/BLUE SHIELD | Attending: Radiation Oncology | Admitting: Physical Therapy

## 2014-10-29 ENCOUNTER — Encounter: Payer: Self-pay | Admitting: Medical Oncology

## 2014-10-29 ENCOUNTER — Telehealth: Payer: Self-pay | Admitting: Hematology and Oncology

## 2014-10-29 ENCOUNTER — Other Ambulatory Visit: Payer: Self-pay | Admitting: *Deleted

## 2014-10-29 DIAGNOSIS — E8989 Other postprocedural endocrine and metabolic complications and disorders: Secondary | ICD-10-CM | POA: Insufficient documentation

## 2014-10-29 DIAGNOSIS — I890 Lymphedema, not elsewhere classified: Secondary | ICD-10-CM | POA: Insufficient documentation

## 2014-10-29 NOTE — Therapy (Signed)
[place], [place], [address] Phone: [phone]   Fax:  [phone]  Physical Therapy Treatment  Patient Details  Name: Michelle Kennedy MRN: [id number] Date of Birth: 07-Mar-1956 Referring Provider:  [name], MD  Encounter Date: 10/29/2014      PT End of Session - 10/29/14 1705    Visit Number 2   Number of Visits 24   Date for PT Re-Evaluation 12/29/14   PT Start Time 1505   PT Stop Time 1555   PT Time Calculation (min) 50 min   Activity Tolerance Patient tolerated treatment well   Behavior During Therapy [facility] for tasks assessed/performed      Past Medical History  Diagnosis Date  . GERD (gastroesophageal reflux disease)   . Hyperlipidemia   . History of seizure age 29    as a reaction to Penicillin - no seizures since  . Migraines   . Arthritis     hips and knees  . History of gastric ulcer     as a teenager  . UTI (lower urinary tract infection) 02/17/2014  . Fibromyalgia   . Asthma     daily inhaler, prn inhaler and neb.  Marland Kitchen History of thyroid cancer     s/p thyroidectomy  . Breast cancer 01/2014    left  . Family history of anesthesia complication     twin brother aspirated and died on OR table, per pt.  . Abnormally small mouth   . Dental bridge present     upper front and lower right  . Dental crowns present     x 3  . Hypothyroidism     Past Surgical History  Procedure Laterality Date  . Cholecystectomy  1990  . Breast surgery  2011    left breast biposy  . Appendectomy  2004  . Tonsillectomy and adenoidectomy  2000  . Abdominal hysterectomy  2004    complete  . Thyroidectomy  2000  . Colonoscopy w/ polypectomy  06/2009  . Tumor excision      from thoracic spine  . Incontinence surgery  2004  . Achilles tendon repair Right   . Orif toe fracture Right     great toe  . Knee arthroscopy Bilateral     x 6 each knee  . Ligament repair Right     thumb/wrist  .  Eye surgery Right 2013    exc. warts from underneath eyelid  . Breast lumpectomy with needle localization and axillary sentinel lymph node bx Left 02/23/2014    Procedure: BREAST LUMPECTOMY WITH NEEDLE LOCALIZATION AND AXILLARY SENTINEL LYMPH NODE BIOPSY;  Surgeon: Excell Seltzer, MD;  Location: [place];  Service: General;  Laterality: Left;  . Portacath placement N/A 03/24/2014    Procedure: INSERTION PORT-A-CATH;  Surgeon: Excell Seltzer, MD;  Location: [place];  Service: General;  Laterality: N/A;    There were no vitals filed for this visit.  Visit Diagnosis:  Lymphedema of upper extremity following lymphadenectomy      Subjective Assessment - 10/29/14 1515    Subjective Finished radiatoin on June 7 and is under treatment for DVT.  She still has some redness at chest with healing desquamation.  She is here to beging lymphedema treatment   Pertinent History lumpectomy with 5 nodes biopsy 02-23-14, completed chemo 08/17/2013, will start radiation april 21. She has lots of allergies, carries an epi pen  pt is left handed   Patient Stated Goals  to have arm swelling reduced.  she wants to knit without a problem    Pain Score 2    Pain Location Arm   Pain Orientation Left   Pain Descriptors / Indicators Tightness   Pain Type Chronic pain   Pain Onset More than a month ago   Pain Frequency Intermittent   Aggravating Factors  worse when it warmer, and when she is more active.   Pain Relieving Factors circular massages elevation squeeze her hand                LYMPHEDEMA/ONCOLOGY QUESTIONNAIRE - 10/29/14 1545    Left Upper Extremity Lymphedema   15 cm Proximal to Olecranon Process 41.5 cm   10 cm Proximal to Olecranon Process 37.4 cm   Olecranon Process 30.7 cm   15 cm Proximal to Ulnar Styloid Process 32 cm   10 cm Proximal to Ulnar Styloid Process 29.3 cm   Just Proximal to Ulnar Styloid Process 22.3 cm   Across Hand at [employer]  22.8 cm   At [place] of 2nd Digit 7.8 cm                  [facility] Adult PT Treatment/Exercise - 10/29/14 0001    Manual Therapy   Edema Management provided medium Tg soft    Manual Lymphatic Drainage (MLD) short neck, superificial and deep abdominals, right axilla and anterior interaxillary anastamosis avoiding irradiated skin, left inguinal nodes with left axially-inguinal anastamosis avoiding irradidated skin. left shoulder , arm, wrist and hand with return along lymphatic pathways                PT Education - 10/29/14 1704    Education provided Yes   Education Details lymphatic yoga series, lymphatic remedial exercises issued klosetraining DVD   Person(s) Educated Patient   Methods Explanation;Demonstration;Handout   Comprehension Verbalized understanding;Need further instruction           Short Term Clinic Goals - 10/29/14 1711    CC Short Term Goal  #1   Title Patient will verbalize understanding of lymphedema risk reduction practices   Time 4   Status On-going   CC Short Term Goal  #2   Title Patient will be independent in self manual lymph drainage   Time 4   Period Weeks   Status On-going             Long Term Clinic Goals - 10/29/14 1712    CC Long Term Goal  #1   Title Patient will have a circumferential reduction of     1  cm at   15   cm above the   ulnar styloid    Baseline 32   Time 8   Period Weeks   Status On-going   CC Long Term Goal  #2   Title Patient will have a circumferential reduction of      1 cm at      15    cm above the   olecranon process   Baseline 41.5   Time 8   Period Weeks   CC Long Term Goal  #3   Title Patient will be know how to obtain and use compression garments for maintenance phase of treatment   Time 8   Status On-going   CC Long Term Goal  #4   Title Patient will decrease the DASH score to < 20   to demonstrate increased functional use of upper extremity   Baseline 50  Time 8   Status On-going             Plan - 10/29/14 1707    Clinical Impression Statement pt returns to physical theapy for lymphedema treatment after completion of radiation and now stabalized from DVT.  She already has had some circumferential measurement reduction since eval on 08/27/2014 and is ready to progress with manual lymph drainage, compression wrapping, exercise and procurement of compression garments when she has completed reduction.    Pt will benefit from skilled therapeutic intervention in order to improve on the following deficits Decreased knowledge of precautions;Increased edema   Rehab Potential Excellent   Clinical Impairments Affecting Rehab Potential previous seroma, mulitpe allergies with quick "hive" response   PT Frequency 3x / week   PT Duration 8 weeks   PT Treatment/Interventions Therapeutic exercise;Compression bandaging;Patient/family education;DME Instruction;Manual techniques;Manual lymph drainage;Therapeutic activities   PT Next Visit Plan complete decongestive therapy with wrapping if schedule allows her to return in timely fashion.   PT Home Exercise Plan lymphatic remedial exercise    Recommended Other Services ABC class   Consulted and Agree with Plan of Care Patient        Problem List Patient Active Problem List   Diagnosis Date Noted  . DVT (deep venous thrombosis) 08/28/2014  . Lymphedema of arm 08/28/2014  . Mucositis due to chemotherapy 06/02/2014  . UTI symptoms 02/17/2014  . Menopausal syndrome (hot flashes) 12/31/2013  . Breast cancer of upper-outer quadrant of left female breast 12/26/2013  . 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 Gingivitis 12/05/2013  . Seasonal allergies 09/24/2013  . Skin lesion 06/03/2013  . Menopause 06/02/2013  . Elevated blood-pressure reading without diagnosis of hypertension 03/05/2013  . Cough 07/24/2012  . Left knee injury 11/02/2011  . Gout 09/19/2011  . Hyperlipidemia 06/02/2011  . Migraine 06/02/2011   . Tinnitus of both ears 02/14/2011  . Left knee pain 09/23/2010  . FIBROMYALGIA 05/10/2010  . Asthma 03/22/2010  . Hypothyroidism 12/20/2009  . OVERWEIGHT 12/20/2009  . GERD 12/20/2009  . DEPRESSION 12/01/2009   Donato Heinz. Owens Shark, PT  10/29/2014, 5:16 PM  [place], [place], [address] Phone: [phone]   Fax:  [phone]

## 2014-10-29 NOTE — Telephone Encounter (Signed)
Called and left a message with follow up appointment °

## 2014-10-29 NOTE — Patient Instructions (Addendum)
[email or web address] lypmphatic flow series with shoosh lettick crotzer   Neck Side-Bending   Begin with chin level, head centered over spine. Slowly lower one ear toward shoulder keeping shoulder down. Hold __3__ seconds. Slowly return to starting position. Repeat to other side.  Repeat __5-10__ times to each side. Do 2-3 times a day. Also stretch by looking over right shoulder, then left.  Scapular Retraction (Standing)   With arms at sides, pinch shoulder blades together.  Hold 3 seconds. Repeat __5-10__ times per set.  Do __2-3__ sessions per day.   Active ROM Flexion    Raise arm to point to ceiling, keeping elbow straight. Hold __3__ seconds. Repeat _5-10___ times. Do _2-3___ sessions per day. Activity: Use this motion to reach for items on overhead shelves.     Flexion (Active)   Palm up, rest elbow on other hand. Bend as far as possible. Hold __3__ seconds. Repeat __5-10__ times. Do _2-3___ sessions per day. Can use other hand to bend elbow further if needed where bandage may limit end motion.  Copyright  VHI. All rights reserved.    Extension (Active With Finger Extension)   Llift hand with fingers straight, then bend wrist down.  Hold _3___ seconds each way. Repeat __5-10__ times. Do _2-3___ sessions per day.    AROM: Forearm Pronation / Supination   With right arm in handshake position, slowly rotate palm down. Relax. Then rotate palm up. Repeat __5-10__ times per set. Do __2-3__ sessions per day.  Copyright  VHI. All rights reserved.    AROM: Finger Flexion / Extension   Actively bend fingers of right hand. Start with knuckles furthest from palm, and slowly make a fist. Hold __3__ seconds. Relax. Then straighten fingers as far as possible. Repeat _5-10___ times per set.  Do __2-3__ sessions per day. Also spread fingers as far as able, then bring them back together.  Copyright  VHI. All rights reserved.   If any pain/tingling symptoms occur, try all of  these exercises first to promote circulation. If symptoms do not ease off, then remove bandages and bring all wrappings back to next appointment.

## 2014-11-01 NOTE — Progress Notes (Signed)
  Radiation Oncology         [phone] ________________________________  Name: Michelle Kennedy MRN: [id number]  Date: 10/27/2014  DOB: 01-20-1956  End of Treatment Note  Diagnosis:   Clinical: Stage IIB (T3, N0, cM0)  Pathologic: Stage IIB (T2, N1a, cM0) Left Breast invasive ductal carcinoma, ER/PR +, Her2-, Ki6711% (UOQ)     Indication for treatment:  curative       Radiation treatment dates:   09/10/2014-10/27/2014  Site/dose:   1) Left Breast / 50.4 Gy in 28 fractions 2) Left Supraclavicular fossa and Mid Axilla / 45 Gy in 25 fractions 3) Left Breast boost / 10 Gy in 5 fractions  Beams/energy:    1) 3D conformal tangents / 15, 10 and 6 MV photons 2) 2 Field / 15 and 10 MV photons 3) 3 field photons / 15 and 6 MV   Narrative: The patient tolerated radiation treatment relatively well. She was found to have a LUE DVT before RT which Dr Lindi Adie treated with anticoagulants.  She did develop a seroma infection early in the RT course which Dr Excell Seltzer treated with aspiration and antibiotics.   Plan: The patient has completed radiation treatment. The patient will return to radiation oncology clinic for routine followup in one month. I advised them to call or return sooner if they have any questions or concerns related to their recovery or treatment.  -----------------------------------  [name], MD

## 2014-11-03 ENCOUNTER — Telehealth: Payer: Self-pay | Admitting: *Deleted

## 2014-11-03 NOTE — Telephone Encounter (Signed)
CALLED PATIENT TO INFORM OF FU ON 12-04-14 @ 3:20 PM, LVM FOR A RETURN CALL

## 2014-11-09 ENCOUNTER — Ambulatory Visit: Payer: BLUE CROSS/BLUE SHIELD

## 2014-11-10 ENCOUNTER — Telehealth: Payer: Self-pay | Admitting: Family

## 2014-11-10 NOTE — Telephone Encounter (Signed)
Pre visit letter mailed 11/09/14

## 2014-11-11 ENCOUNTER — Ambulatory Visit: Payer: BLUE CROSS/BLUE SHIELD

## 2014-11-11 DIAGNOSIS — E8989 Other postprocedural endocrine and metabolic complications and disorders: Secondary | ICD-10-CM | POA: Diagnosis not present

## 2014-11-11 DIAGNOSIS — I890 Lymphedema, not elsewhere classified: Principal | ICD-10-CM

## 2014-11-11 NOTE — Therapy (Signed)
[place], [place], [address] Phone: [phone]   Fax:  [phone]  Physical Therapy Treatment  Patient Details  Name: Michelle Kennedy MRN: [id number] Date of Birth: [birth year] Referring Provider:  Debbrah Alar, NP  Encounter Date: 11/11/2014      PT End of Session - 11/11/14 1558    Visit Number 3   Number of Visits 24   Date for PT Re-Evaluation 12/29/14   PT Start Time 4287   PT Stop Time 1556   PT Time Calculation (min) 40 min   Activity Tolerance Patient tolerated treatment well   Behavior During Therapy [facility] for tasks assessed/performed      Past Medical History  Diagnosis Date  . GERD (gastroesophageal reflux disease)   . Hyperlipidemia   . History of seizure age 59    as a reaction to Penicillin - no seizures since  . Migraines   . Arthritis     hips and knees  . History of gastric ulcer     as a teenager  . UTI (lower urinary tract infection) 02/17/2014  . Fibromyalgia   . Asthma     daily inhaler, prn inhaler and neb.  Marland Kitchen History of thyroid cancer     s/p thyroidectomy  . Breast cancer 01/2014    left  . Family history of anesthesia complication     twin brother aspirated and died on OR table, per pt.  . Abnormally small mouth   . Dental bridge present     upper front and lower right  . Dental crowns present     x 3  . Hypothyroidism     Past Surgical History  Procedure Laterality Date  . Cholecystectomy  1990  . Breast surgery  2011    left breast biposy  . Appendectomy  2004  . Tonsillectomy and adenoidectomy  2000  . Abdominal hysterectomy  2004    complete  . Thyroidectomy  2000  . Colonoscopy w/ polypectomy  06/2009  . Tumor excision      from thoracic spine  . Incontinence surgery  2004  . Achilles tendon repair Right   . Orif toe fracture Right     great toe  . Knee arthroscopy Bilateral     x 6 each knee  . Ligament repair Right     thumb/wrist   . Eye surgery Right 2013    exc. warts from underneath eyelid  . Breast lumpectomy with needle localization and axillary sentinel lymph node bx Left 02/23/2014    Procedure: BREAST LUMPECTOMY WITH NEEDLE LOCALIZATION AND AXILLARY SENTINEL LYMPH NODE BIOPSY;  Surgeon: Excell Seltzer, MD;  Location: [place];  Service: General;  Laterality: Left;  . Portacath placement N/A 03/24/2014    Procedure: INSERTION PORT-A-CATH;  Surgeon: Excell Seltzer, MD;  Location: [place];  Service: General;  Laterality: N/A;    There were no vitals filed for this visit.  Visit Diagnosis:  Lymphedema of upper extremity following lymphadenectomy      Subjective Assessment - 11/11/14 1517    Subjective Still having the numbness in my fingers. DVT treamtent is over aside from taking a medication over the next 3 months.    Currently in Pain? No/denies                         [facility] Adult PT Treatment/Exercise - 11/11/14 0001    Manual Therapy   Edema  Management Pt instructed in remedial exercises and to perform these first if any tingling/pain symptoms occur. Remove bandages if exerceises do not alleviate symptoms.   Manual Lymphatic Drainage (MLD) short neck, superificial and deep abdominals, right axilla and anterior interaxillary anastamosis, left inguinal nodes with left axially-inguinal anastamosis. left shoulder , arm, wrist and hand with return along lymphatic pathways   Compression Bandaging Biotone lotione, stockinette, Elastomull to fingers 2-5, Artiflex x1, 1-6, 1-10 and 1-12 cm short stretch compression bandages from hand to axilla.                 PT Education - 11/11/14 1557    Education provided Yes   Education Details Remedial exercises to perform with bandages on   Person(s) Educated Patient   Methods Explanation;Demonstration   Comprehension Verbalized understanding           Short Term Clinic Goals - 10/29/14 1711    CC  Short Term Goal  #1   Title Patient will verbalize understanding of lymphedema risk reduction practices   Time 4   Status On-going   CC Short Term Goal  #2   Title Patient will be independent in self manual lymph drainage   Time 4   Period Weeks   Status On-going             Long Term Clinic Goals - 10/29/14 1712    CC Long Term Goal  #1   Title Patient will have a circumferential reduction of     1  cm at   15   cm above the   ulnar styloid    Baseline 32   Time 8   Period Weeks   Status On-going   CC Long Term Goal  #2   Title Patient will have a circumferential reduction of      1 cm at      15    cm above the   olecranon process   Baseline 41.5   Time 8   Period Weeks   CC Long Term Goal  #3   Title Patient will be know how to obtain and use compression garments for maintenance phase of treatment   Time 8   Status On-going   CC Long Term Goal  #4   Title Patient will decrease the DASH score to < 20   to demonstrate increased functional use of upper extremity   Baseline 50   Time 8   Status On-going            Plan - 11/11/14 1558    Clinical Impression Statement Pt tolerated treatment well today and has a good understanding to leave bandages on until next appt on Friday unless tingling/painful symptoms occur. Also had good understanding of remedial exercises.    Pt will benefit from skilled therapeutic intervention in order to improve on the following deficits Decreased knowledge of precautions;Increased edema   Rehab Potential Excellent   Clinical Impairments Affecting Rehab Potential previous seroma, mulitpe allergies with quick "hive" response   PT Frequency 3x / week   PT Duration 8 weeks   PT Treatment/Interventions Therapeutic exercise;Compression bandaging;Patient/family education;DME Instruction;Manual techniques;Manual lymph drainage;Therapeutic activities   PT Next Visit Plan Continue complete decongestive therapy, assess how pt tolerated bandages.  Remeasure next visit.    PT Home Exercise Plan Remedial exercises with compression bandaging.    Consulted and Agree with Plan of Care Patient        Problem List Patient Active Problem List  Diagnosis Date Noted  . DVT (deep venous thrombosis) 08/28/2014  . Lymphedema of arm 08/28/2014  . Mucositis due to chemotherapy 06/02/2014  . UTI symptoms 02/17/2014  . Menopausal syndrome (hot flashes) 12/31/2013  . Breast cancer of upper-outer quadrant of left female breast 12/26/2013  . 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 Gingivitis 12/05/2013  . Seasonal allergies 09/24/2013  . Skin lesion 06/03/2013  . Menopause 06/02/2013  . Elevated blood-pressure reading without diagnosis of hypertension 03/05/2013  . Cough 07/24/2012  . Left knee injury 11/02/2011  . Gout 09/19/2011  . Hyperlipidemia 06/02/2011  . Migraine 06/02/2011  . Tinnitus of both ears 02/14/2011  . Left knee pain 09/23/2010  . FIBROMYALGIA 05/10/2010  . Asthma 03/22/2010  . Hypothyroidism 12/20/2009  . OVERWEIGHT 12/20/2009  . GERD 12/20/2009  . DEPRESSION 12/01/2009    Otelia Limes, PTA 11/11/2014, 4:02 PM  [place], [place], [address] Phone: [phone]   Fax:  [phone]

## 2014-11-13 ENCOUNTER — Other Ambulatory Visit: Payer: Self-pay | Admitting: General Surgery

## 2014-11-13 ENCOUNTER — Ambulatory Visit: Payer: BLUE CROSS/BLUE SHIELD | Admitting: Physical Therapy

## 2014-11-13 DIAGNOSIS — I890 Lymphedema, not elsewhere classified: Principal | ICD-10-CM

## 2014-11-13 DIAGNOSIS — E8989 Other postprocedural endocrine and metabolic complications and disorders: Secondary | ICD-10-CM

## 2014-11-13 NOTE — Therapy (Signed)
[place], [place], [address] Phone: [phone]   Fax:  [phone]  Physical Therapy Treatment  Patient Details  Name: Michelle Kennedy MRN: [id number] Date of Birth: [birth year] Referring Provider:  [name], MD  Encounter Date: 11/13/2014      PT End of Session - 11/13/14 1214    PT Start Time 1020   PT Stop Time 1106   PT Time Calculation (min) 46 min      Past Medical History  Diagnosis Date  . GERD (gastroesophageal reflux disease)   . Hyperlipidemia   . History of seizure age 15    as a reaction to Penicillin - no seizures since  . Migraines   . Arthritis     hips and knees  . History of gastric ulcer     as a teenager  . UTI (lower urinary tract infection) 02/17/2014  . Fibromyalgia   . Asthma     daily inhaler, prn inhaler and neb.  Marland Kitchen History of thyroid cancer     s/p thyroidectomy  . Breast cancer 01/2014    left  . Family history of anesthesia complication     twin brother aspirated and died on OR table, per pt.  . Abnormally small mouth   . Dental bridge present     upper front and lower right  . Dental crowns present     x 3  . Hypothyroidism     Past Surgical History  Procedure Laterality Date  . Cholecystectomy  1990  . Breast surgery  2011    left breast biposy  . Appendectomy  2004  . Tonsillectomy and adenoidectomy  2000  . Abdominal hysterectomy  2004    complete  . Thyroidectomy  2000  . Colonoscopy w/ polypectomy  06/2009  . Tumor excision      from thoracic spine  . Incontinence surgery  2004  . Achilles tendon repair Right   . Orif toe fracture Right     great toe  . Knee arthroscopy Bilateral     x 6 each knee  . Ligament repair Right     thumb/wrist  . Eye surgery Right 2013    exc. warts from underneath eyelid  . Breast lumpectomy with needle localization and axillary sentinel lymph node bx Left 02/23/2014    Procedure: BREAST LUMPECTOMY  WITH NEEDLE LOCALIZATION AND AXILLARY SENTINEL LYMPH NODE BIOPSY;  Surgeon: Excell Seltzer, MD;  Location: [place];  Service: General;  Laterality: Left;  . Portacath placement N/A 03/24/2014    Procedure: INSERTION PORT-A-CATH;  Surgeon: Excell Seltzer, MD;  Location: [place];  Service: General;  Laterality: N/A;    There were no vitals filed for this visit.  Visit Diagnosis:  Lymphedema of upper extremity following lymphadenectomy      Subjective Assessment - 11/13/14 1024    Subjective the only problems was pain at left wrist.  Bandage loose loose and falling off    Pertinent History lumpectomy with 5 nodes biopsy 02-23-14, completed chemo 08/17/2013, will start radiation april 21. She has lots of allergies, carries an epi pen  pt is left handed   Currently in Pain? No/denies               LYMPHEDEMA/ONCOLOGY QUESTIONNAIRE - 11/13/14 1025    Left Upper Extremity Lymphedema   15 cm Proximal to Olecranon Process 40.6 cm   10 cm Proximal to Olecranon Process 37.4 cm  Olecranon Process 30.2 cm   15 cm Proximal to Ulnar Styloid Process 31.2 cm   10 cm Proximal to Ulnar Styloid Process 29 cm   Just Proximal to Ulnar Styloid Process 21.6 cm   Across Hand at [employer] 21.9 cm   At [place] of 2nd Digit 7.2 cm                  [facility] Adult PT Treatment/Exercise - 11/13/14 0001    Manual Therapy   Manual Lymphatic Drainage (MLD) short neck, superificial and deep abdominals, right axilla and anterior interaxillary anastamosis, left inguinal nodes with left axially-inguinal anastamosis. left shoulder , arm, wrist and hand with return along lymphatic pathways   Compression Bandaging Biotone lotione, stockinette, Elastomull x2 to fingers 1 -5, with coverage at web spaces, tape overtop Artiflex x2  with thin foam band at wrist and peach lined foam at posterior forearm 1-6, 1-10 and 2-12 cm short stretch compression bandages from hand to  axilla.    Neck Exercises: Stretches   Other Neck Stretches neck and upper thoracic range of motoin to warm up                    [facility] Goals - 11/13/14 1218    CC Short Term Goal  #1   Title Patient will verbalize understanding of lymphedema risk reduction practices   Status On-going   CC Short Term Goal  #2   Title Patient will be independent in self manual lymph drainage   Status On-going             Long Term Clinic Goals - 11/13/14 1216    CC Long Term Goal  #1   Title Patient will have a circumferential reduction of     1  cm at   15   cm above the   ulnar styloid    Baseline 32 baseline, 31.2 11/13/2014   Status On-going   CC Long Term Goal  #2   Title Patient will have a circumferential reduction of      1 cm at      15    cm above the   olecranon process   Baseline 41.5 baseline,  40.6 at .24.2026   Status On-going   CC Long Term Goal  #3   Title Patient will be know how to obtain and use compression garments for maintenance phase of treatment   Status On-going   CC Long Term Goal  #4   Title Patient will decrease the DASH score to < 20   to demonstrate increased functional use of upper extremity   Status On-going            Plan - 11/13/14 1214    Clinical Impression Statement Good reduction with intial bandaging.  Upgraded foam at wrist and forearm and additional bandage to compression.  demographics [facility] to [facility] for flat knit sleeve and glove   PT Next Visit Plan Continue complete decongestive therapy, assess how pt tolerated bandages.         Problem List Patient Active Problem List   Diagnosis Date Noted  . DVT (deep venous thrombosis) 08/28/2014  . Lymphedema of arm 08/28/2014  . Mucositis due to chemotherapy 06/02/2014  . UTI symptoms 02/17/2014  . Menopausal syndrome (hot flashes) 12/31/2013  . Breast cancer of upper-outer quadrant of left female breast 12/26/2013  . Ductal carcinoma in situ (DCIS) of left breast  12/23/2013  . Gingivitis 12/05/2013  .  Seasonal allergies 09/24/2013  . Skin lesion 06/03/2013  . Menopause 06/02/2013  . Elevated blood-pressure reading without diagnosis of hypertension 03/05/2013  . Cough 07/24/2012  . Left knee injury 11/02/2011  . Gout 09/19/2011  . Hyperlipidemia 06/02/2011  . Migraine 06/02/2011  . Tinnitus of both ears 02/14/2011  . Left knee pain 09/23/2010  . FIBROMYALGIA 05/10/2010  . Asthma 03/22/2010  . Hypothyroidism 12/20/2009  . OVERWEIGHT 12/20/2009  . GERD 12/20/2009  . DEPRESSION 12/01/2009   Donato Heinz. Owens Shark PT   Norwood Levo 11/13/2014, 12:19 PM  [place], [place], [address] Phone: [phone]   Fax:  [phone]

## 2014-11-16 ENCOUNTER — Ambulatory Visit: Payer: BLUE CROSS/BLUE SHIELD | Admitting: Physical Therapy

## 2014-11-16 DIAGNOSIS — I890 Lymphedema, not elsewhere classified: Principal | ICD-10-CM

## 2014-11-16 DIAGNOSIS — E8989 Other postprocedural endocrine and metabolic complications and disorders: Secondary | ICD-10-CM | POA: Diagnosis not present

## 2014-11-16 NOTE — Therapy (Signed)
[place], [place], [address] Phone: [phone]   Fax:  [phone]  Physical Therapy Treatment  Patient Details  Name: Michelle Kennedy MRN: [id number] Date of Birth: 24-Jul-1955 Referring Provider:  Debbrah Alar, NP  Encounter Date: 11/16/2014      PT End of Session - 11/16/14 1629    Visit Number 5   Number of Visits 24   Date for PT Re-Evaluation 12/29/14   PT Start Time 1522   PT Stop Time 1620   PT Time Calculation (min) 58 min   Activity Tolerance Patient tolerated treatment well   Behavior During Therapy [facility] for tasks assessed/performed      Past Medical History  Diagnosis Date  . GERD (gastroesophageal reflux disease)   . Hyperlipidemia   . History of seizure age 59    as a reaction to Penicillin - no seizures since  . Migraines   . Arthritis     hips and knees  . History of gastric ulcer     as a teenager  . UTI (lower urinary tract infection) 02/17/2014  . Fibromyalgia   . Asthma     daily inhaler, prn inhaler and neb.  Marland Kitchen History of thyroid cancer     s/p thyroidectomy  . Breast cancer 01/2014    left  . Family history of anesthesia complication     twin brother aspirated and died on OR table, per pt.  . Abnormally small mouth   . Dental bridge present     upper front and lower right  . Dental crowns present     x 3  . Hypothyroidism     Past Surgical History  Procedure Laterality Date  . Cholecystectomy  1990  . Breast surgery  2011    left breast biposy  . Appendectomy  2004  . Tonsillectomy and adenoidectomy  2000  . Abdominal hysterectomy  2004    complete  . Thyroidectomy  2000  . Colonoscopy w/ polypectomy  06/2009  . Tumor excision      from thoracic spine  . Incontinence surgery  2004  . Achilles tendon repair Right   . Orif toe fracture Right     great toe  . Knee arthroscopy Bilateral     x 6 each knee  . Ligament repair Right     thumb/wrist   . Eye surgery Right 2013    exc. warts from underneath eyelid  . Breast lumpectomy with needle localization and axillary sentinel lymph node bx Left 02/23/2014    Procedure: BREAST LUMPECTOMY WITH NEEDLE LOCALIZATION AND AXILLARY SENTINEL LYMPH NODE BIOPSY;  Surgeon: Excell Seltzer, MD;  Location: [place];  Service: General;  Laterality: Left;  . Portacath placement N/A 03/24/2014    Procedure: INSERTION PORT-A-CATH;  Surgeon: Excell Seltzer, MD;  Location: [place];  Service: General;  Laterality: N/A;    There were no vitals filed for this visit.  Visit Diagnosis:  Lymphedema of upper extremity following lymphadenectomy      Subjective Assessment - 11/16/14 1531    Subjective I had to rip my hand and finger bandages off because they were driving me crazy.   Pertinent History lumpectomy with 5 nodes biopsy 02-23-14, completed chemo 08/17/2013, will start radiation april 21. She has lots of allergies, carries an epi pen  pt is left handed   Patient Stated Goals to have arm swelling reduced.  she wants to knit without a problem  Currently in Pain? No/denies               LYMPHEDEMA/ONCOLOGY QUESTIONNAIRE - 11/16/14 1557    Left Upper Extremity Lymphedema   15 cm Proximal to Olecranon Process 39.8 cm   10 cm Proximal to Olecranon Process 36 cm   Olecranon Process 30.5 cm   15 cm Proximal to Ulnar Styloid Process 31 cm   10 cm Proximal to Ulnar Styloid Process 28.7 cm   Just Proximal to Ulnar Styloid Process 22.7 cm   Across Hand at [employer] 21.9 cm   At [place] of 2nd Digit 8.2 cm                  [facility] Adult PT Treatment/Exercise - 11/16/14 0001    Manual Therapy   Manual Lymphatic Drainage (MLD) short neck, superificial and deep abdominals, right axilla and anterior interaxillary anastamosis, left inguinal nodes with left axially-inguinal anastamosis. left shoulder , arm, wrist and hand with return along lymphatic  pathways   Compression Bandaging Biotone lotion, stockinette, Elastomull x2 to fingers 1 -5, with coverage at web spaces, Artiflex x3 with extra on hand and wrist; 1-6 cm, 1-10cm and 2-12 cm short stretch compression bandages from hand to axilla.                    Short Term Clinic Goals - 11/13/14 1218    CC Short Term Goal  #1   Title Patient will verbalize understanding of lymphedema risk reduction practices   Status On-going   CC Short Term Goal  #2   Title Patient will be independent in self manual lymph drainage   Status On-going             Long Term Clinic Goals - 11/13/14 1216    CC Long Term Goal  #1   Title Patient will have a circumferential reduction of     1  cm at   15   cm above the   ulnar styloid    Baseline 32 baseline, 31.2 11/13/2014   Status On-going   CC Long Term Goal  #2   Title Patient will have a circumferential reduction of      1 cm at      15    cm above the   olecranon process   Baseline 41.5 baseline,  40.6 at .24.2026   Status On-going   CC Long Term Goal  #3   Title Patient will be know how to obtain and use compression garments for maintenance phase of treatment   Status On-going   CC Long Term Goal  #4   Title Patient will decrease the DASH score to < 20   to demonstrate increased functional use of upper extremity   Status On-going            Plan - 11/16/14 1630    Clinical Impression Statement Measurements done today showed a reduction throughout her right arm excluding her wrist and hand which is likely due to her removing bandages in the middle of the night last night.  Her hand and fingers were increased but responded well to manual lymph drainage.  She will benefit from continued decongestive therapy.     Pt will benefit from skilled therapeutic intervention in order to improve on the following deficits Decreased knowledge of precautions;Increased edema   Rehab Potential Excellent   Clinical Impairments Affecting Rehab  Potential Seroma now with drain   PT Frequency 3x /  week   PT Duration 8 weeks   PT Treatment/Interventions Therapeutic exercise;Compression bandaging;Patient/family education;DME Instruction;Manual techniques;Manual lymph drainage;Therapeutic activities   PT Next Visit Plan Assess if extra Artiflex on hand and wrist allowed her to wear her bandages longer without pain.  Continue complete decongestive therapy.   PT Home Exercise Plan Remedial exercises with compression bandaging.    Consulted and Agree with Plan of Care Patient        Problem List Patient Active Problem List   Diagnosis Date Noted  . DVT (deep venous thrombosis) 08/28/2014  . Lymphedema of arm 08/28/2014  . Mucositis due to chemotherapy 06/02/2014  . UTI symptoms 02/17/2014  . Menopausal syndrome (hot flashes) 12/31/2013  . Breast cancer of upper-outer quadrant of left female breast 12/26/2013  . 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 Gingivitis 12/05/2013  . Seasonal allergies 09/24/2013  . Skin lesion 06/03/2013  . Menopause 06/02/2013  . Elevated blood-pressure reading without diagnosis of hypertension 03/05/2013  . Cough 07/24/2012  . Left knee injury 11/02/2011  . Gout 09/19/2011  . Hyperlipidemia 06/02/2011  . Migraine 06/02/2011  . Tinnitus of both ears 02/14/2011  . Left knee pain 09/23/2010  . FIBROMYALGIA 05/10/2010  . Asthma 03/22/2010  . Hypothyroidism 12/20/2009  . OVERWEIGHT 12/20/2009  . GERD 12/20/2009  . DEPRESSION 12/01/2009    Annia Friendly, PT 11/16/2014, 4:33 PM  [place], [place], [address] Phone: [phone]   Fax:  [phone]

## 2014-11-18 ENCOUNTER — Ambulatory Visit: Payer: BLUE CROSS/BLUE SHIELD

## 2014-11-18 DIAGNOSIS — E8989 Other postprocedural endocrine and metabolic complications and disorders: Secondary | ICD-10-CM | POA: Diagnosis not present

## 2014-11-18 DIAGNOSIS — I890 Lymphedema, not elsewhere classified: Principal | ICD-10-CM

## 2014-11-18 NOTE — Therapy (Signed)
[place], [place], [address] Phone: [phone]   Fax:  [phone]  Physical Therapy Treatment  Patient Details  Name: Michelle Kennedy MRN: [id number] Date of Birth: [birth year] Referring Provider:  [name], MD  Encounter Date: 11/18/2014      PT End of Session - 11/18/14 1535    Visit Number 6   Number of Visits 24   Date for PT Re-Evaluation 12/29/14   PT Start Time 7425   PT Stop Time 1435   PT Time Calculation (min) 40 min   Activity Tolerance Patient tolerated treatment well   Behavior During Therapy [facility] for tasks assessed/performed      Past Medical History  Diagnosis Date  . GERD (gastroesophageal reflux disease)   . Hyperlipidemia   . History of seizure age 80    as a reaction to Penicillin - no seizures since  . Migraines   . Arthritis     hips and knees  . History of gastric ulcer     as a teenager  . UTI (lower urinary tract infection) 02/17/2014  . Fibromyalgia   . Asthma     daily inhaler, prn inhaler and neb.  Marland Kitchen History of thyroid cancer     s/p thyroidectomy  . Breast cancer 01/2014    left  . Family history of anesthesia complication     twin brother aspirated and died on OR table, per pt.  . Abnormally small mouth   . Dental bridge present     upper front and lower right  . Dental crowns present     x 3  . Hypothyroidism     Past Surgical History  Procedure Laterality Date  . Cholecystectomy  1990  . Breast surgery  2011    left breast biposy  . Appendectomy  2004  . Tonsillectomy and adenoidectomy  2000  . Abdominal hysterectomy  2004    complete  . Thyroidectomy  2000  . Colonoscopy w/ polypectomy  06/2009  . Tumor excision      from thoracic spine  . Incontinence surgery  2004  . Achilles tendon repair Right   . Orif toe fracture Right     great toe  . Knee arthroscopy Bilateral     x 6 each knee  . Ligament repair Right     thumb/wrist  .  Eye surgery Right 2013    exc. warts from underneath eyelid  . Breast lumpectomy with needle localization and axillary sentinel lymph node bx Left 02/23/2014    Procedure: BREAST LUMPECTOMY WITH NEEDLE LOCALIZATION AND AXILLARY SENTINEL LYMPH NODE BIOPSY;  Surgeon: Excell Seltzer, MD;  Location: [place];  Service: General;  Laterality: Left;  . Portacath placement N/A 03/24/2014    Procedure: INSERTION PORT-A-CATH;  Surgeon: Excell Seltzer, MD;  Location: [place];  Service: General;  Laterality: N/A;    There were no vitals filed for this visit.  Visit Diagnosis:  Lymphedema of upper extremity following lymphadenectomy      Subjective Assessment - 11/18/14 1404    Subjective They put another drain back in last week and its putting out 25-30 cc's/day. I'm having my port removed next week and the doctor said he probably go in and clean out the drain area and restitch the area up again.   Currently in Pain? No/denies  [place] Adult PT Treatment/Exercise - 11/18/14 0001    Manual Therapy   Manual Lymphatic Drainage (MLD) short neck, superificial and deep abdominals, right axilla and anterior interaxillary anastamosis, left inguinal nodes with left axially-inguinal anastamosis working around new drain. left shoulder , arm, wrist and hand with return along lymphatic pathways   Compression Bandaging Biotone lotion, stockinette, Elastomull x2 to fingers 2 -4, Artiflex x2; 1-6 cm, 1-10cm and 2-12 cm short stretch compression bandages from hand to axilla instructing pt throughout.                    Short Term Clinic Goals - 11/13/14 1218    CC Short Term Goal  #1   Title Patient will verbalize understanding of lymphedema risk reduction practices   Status On-going   CC Short Term Goal  #2   Title Patient will be independent in self manual lymph drainage   Status On-going             Long Term  Clinic Goals - 11/13/14 1216    CC Long Term Goal  #1   Title Patient will have a circumferential reduction of     1  cm at   15   cm above the   ulnar styloid    Baseline 32 baseline, 31.2 11/13/2014   Status On-going   CC Long Term Goal  #2   Title Patient will have a circumferential reduction of      1 cm at      15    cm above the   olecranon process   Baseline 41.5 baseline,  40.6 at .24.2026   Status On-going   CC Long Term Goal  #3   Title Patient will be know how to obtain and use compression garments for maintenance phase of treatment   Status On-going   CC Long Term Goal  #4   Title Patient will decrease the DASH score to < 20   to demonstrate increased functional use of upper extremity   Status On-going            Plan - 11/18/14 1541    Clinical Impression Statement Patient was able to keep bandages on but reported this feeling very limiting with ADLs as she is Lt hand dominant. 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 She will also have the assistance of her husband at home. Pt would like to be measured for a compression sleeve/glove at this time as she reports she is content with her arm as is and really cant tolerate the bandaging much more. Patient wants to get measured for an off the shelf sleeve and glove to wear until her custom garments can get done so she may go get measured for this before her next visit on Tuesday.   Pt will benefit from skilled therapeutic intervention in order to improve on the following deficits Decreased knowledge of precautions;Increased edema   Rehab Potential Excellent   Clinical Impairments Affecting Rehab Potential Seroma now with drain   PT Frequency 3x / week   PT Duration 8 weeks   PT Treatment/Interventions Therapeutic exercise;Compression bandaging;Patient/family  education;DME Instruction;Manual techniques;Manual lymph drainage;Therapeutic activities   PT Next Visit Plan Continue complete decongestive therapy. Pt possibly goinbg to try to get measured for compression sleev/glove off the shelf  per her request at [place] until she can get a custom garment so she can stop bandaging sooner, assess this.    Consulted and Agree with Plan of Care Patient        Problem List Patient Active Problem List   Diagnosis Date Noted  . DVT (deep venous thrombosis) 08/28/2014  . Lymphedema of arm 08/28/2014  . Mucositis due to chemotherapy 06/02/2014  . UTI symptoms 02/17/2014  . Menopausal syndrome (hot flashes) 12/31/2013  . Breast cancer of upper-outer quadrant of left female breast 12/26/2013  . 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 Gingivitis 12/05/2013  . Seasonal allergies 09/24/2013  . Skin lesion 06/03/2013  . Menopause 06/02/2013  . Elevated blood-pressure reading without diagnosis of hypertension 03/05/2013  . Cough 07/24/2012  . Left knee injury 11/02/2011  . Gout 09/19/2011  . Hyperlipidemia 06/02/2011  . Migraine 06/02/2011  . Tinnitus of both ears 02/14/2011  . Left knee pain 09/23/2010  . FIBROMYALGIA 05/10/2010  . Asthma 03/22/2010  . Hypothyroidism 12/20/2009  . OVERWEIGHT 12/20/2009  . GERD 12/20/2009  . DEPRESSION 12/01/2009    Otelia Limes, PTA 11/18/2014, 4:13 PM  [place], [place], [address] Phone: [phone]   Fax:  [phone]

## 2014-11-19 NOTE — Assessment & Plan Note (Signed)
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 M0 stage IIB  Chemotherapy summary: Adjuvant chemotherapy Completed 4 cycles of dose dense Adriamycin and Cytoxan. Followed by Abraxane X 12 completed 08/18/14 Toxicities of chemotherapy: Chemotherapy-induced neutropenia: reduced the dosage of Abraxane to 85 mg/m from week 3 ,Chemotherapy-induced diarrhea resolved with Imodium, Esophageal spasms diminished ,Fatigue related to chemotherapy, Alopecia  DVT left brachial vein: Currently on xarelto, tolerating it well Lymphedema left arm: Recommended that she can reinitiate physical therapy for lymphedema  Treatment plan: Adjuvant antiestrogen therapy with anastrozole once daily for 5 years Patient is agreeable to participate in [facility] clinical trial counseling: Patients who have completed definitive therapy for breast cancer are randomized to antiestrogen therapy (5+ years) versus antiestrogen therapy plus Palbociclib (2 years). Palbociclib: If she was randomized to Palbociclib, I discussed the risks and benefits of Ibrance including myelosuppression especially neutropenia and with that risk of infection, there is risk of pulmonary embolism and mild peripheral neuropathy as well. Fatigue, nausea, diarrhea, decreased appetite as well as alopecia and thrombocytopenia are also potential side effects of Palbociclib.  Return to clinic based on the clinical trial requirements.

## 2014-11-20 ENCOUNTER — Ambulatory Visit: Payer: BLUE CROSS/BLUE SHIELD | Admitting: Physical Therapy

## 2014-11-20 ENCOUNTER — Telehealth: Payer: Self-pay | Admitting: Hematology and Oncology

## 2014-11-20 ENCOUNTER — Encounter: Payer: Self-pay | Admitting: Hematology and Oncology

## 2014-11-20 ENCOUNTER — Ambulatory Visit (HOSPITAL_BASED_OUTPATIENT_CLINIC_OR_DEPARTMENT_OTHER): Payer: BLUE CROSS/BLUE SHIELD | Admitting: Hematology and Oncology

## 2014-11-20 ENCOUNTER — Encounter: Payer: BLUE CROSS/BLUE SHIELD | Admitting: Medical Oncology

## 2014-11-20 VITALS — BP 142/86 | HR 72 | Temp 98.1°F | Resp 18 | Ht 67.0 in | Wt 256.5 lb

## 2014-11-20 DIAGNOSIS — C50412 Malignant neoplasm of upper-outer quadrant of left female breast: Secondary | ICD-10-CM

## 2014-11-20 MED ORDER — ANASTROZOLE 1 MG PO TABS
1.0000 mg | ORAL_TABLET | Freq: Every day | ORAL | Status: DC
Start: 2014-11-20 — End: 2014-12-18

## 2014-11-20 NOTE — Progress Notes (Signed)
Patient Care Team: Debbrah Alar, NP as PCP - General (Internal Medicine) Excell Seltzer, MD as Consulting Physician (General Surgery) Nicholas Lose, MD as Consulting Physician (Hematology and Oncology) [name], MD as Attending Physician (Radiation Oncology)  DIAGNOSIS: Breast cancer of upper-outer quadrant of left female breast   Staging form: Breast, AJCC 7th Edition     Clinical: Stage IIB (T3, N0, cM0) - Unsigned       Staging comments: Staged at breast conference 12/31/13.      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 with lymphovascular invasion margins negative one out of 4 SLN positive, ER 99%, PR 100%, HER-2 negative ratio 0.74, T2, N1, M0 stage IIB   03/19/2014 PET scan No evidence of distant metastatic disease   03/31/2014 - 08/18/2014 Chemotherapy Adjuvant chemotherapy with dose dense Adriamycin Cytoxan x4 followed by Abraxane weekly x12   08/28/2014 Imaging Left brachial vein thrombus   09/11/2014 -  Radiation Therapy Adjuvant radiation therapy    CHIEF COMPLIANT: Follow-up after radiation therapy  INTERVAL HISTORY: Michelle Kennedy is a 59 year old with above-mentioned history of left breast cancer treated with lumpectomy and adjuvant chemotherapy and adjuvant radiation therapy. She is doing quite well and denies any new problems or concerns. She is being fitted for her lymphedema sleeve and  glove. She is here to start antiestrogen therapy.  REVIEW OF SYSTEMS:   Constitutional: Denies fevers, chills or abnormal weight loss Eyes: Denies blurriness of vision Ears, nose, mouth, throat, and face: Denies mucositis or sore throat Respiratory: Denies cough, dyspnea or wheezes Cardiovascular: Denies palpitation, chest discomfort or lower extremity swelling Gastrointestinal:  Denies nausea, heartburn or change in bowel habits Skin: Denies abnormal skin rashes Lymphatics: Left arm lymphedema wrapped up Neurological:Denies numbness, tingling or new weaknesses Behavioral/Psych: Mood is stable, no new changes  Breast:  denies any pain or lumps or nodules in either breasts All other systems were reviewed with the patient and are negative.  I have reviewed the past medical history, past surgical history, social history and family history with the patient and they are unchanged from previous note.  ALLERGIES:  is allergic to bee venom; contrast media; iodine; latex; penicillins; shellfish allergy; aspirin; erythromycin; lidocaine; symbicort; codeine; compazine; pentazocine lactate; and sulfonamide derivatives.  MEDICATIONS:  Current Outpatient Prescriptions  Medication Sig Dispense Refill  . albuterol (PROVENTIL) (5 MG/ML) 0.5% nebulizer solution Take 2.5 mg by nebulization every 6 (six) hours as needed for wheezing or shortness of breath.    . Calcium Carbonate (CALCIUM 500 PO) Take 1 tablet by mouth daily.     Marland Kitchen dexamethasone (DECADRON) 4 MG tablet Take 2 tablets by mouth once a day on the day after chemotherapy and then take 2 tablets two times a day for 2 days. Take with food. 30 tablet 1  . diphenhydrAMINE (BENADRYL) 25 MG tablet Take 1 tablet an hour before MRI.  Then take 1 tablet at bedtime as needed,  per your usual regimen. 30 tablet 0  . DULoxetine (CYMBALTA) 60 MG capsule Take 1 capsule (60 mg total) by mouth daily. 90 capsule 0  . EPINEPHrine 0.3 mg/0.3 mL IJ SOAJ injection  Inject 0.3 mLs (0.3 mg total) into the muscle once. 2 Device 0  . Ergocalciferol (VITAMIN D2) 400 UNITS TABS Take 1 tablet by mouth daily.    . Fluticasone-Salmeterol (ADVAIR DISKUS) 250-50 MCG/DOSE AEPB Inhale 1 puff into the lungs 2 (two) times daily. 180 each 1  . levothyroxine (SYNTHROID, LEVOTHROID) 150 MCG tablet Take 1 tablet (150 mcg total) by mouth daily. 30 tablet 1  . levothyroxine (SYNTHROID, LEVOTHROID) 175 MCG tablet Take 175 mcg by mouth daily. Take an additional half tablet =87.106mg on Monday and Friday (total 262.541m)    . LORazepam (ATIVAN) 0.5 MG tablet Take 1 tablet (0.5 mg total) by mouth every 6 (six) hours as needed (Nausea or vomiting). 30 tablet 0  . meclizine (ANTIVERT) 50 MG tablet Take 0.5 tablets (25 mg total) by mouth 3 (three) times daily as needed for dizziness or nausea. 21 tablet 0  . meloxicam (MOBIC) 7.5 MG tablet Take 1 tablet (7.5 mg total) by mouth daily. 90 tablet 1  . Multiple Vitamin (MULTIVITAMIN) tablet Take 1 tablet by mouth daily.      . nitroGLYCERIN (NITROSTAT) 0.4 MG SL tablet Place 1 tablet (0.4 mg total) under the tongue every 5 (five) minutes as needed for chest pain. 30 tablet 0  . Omega-3 Fatty Acids (FISH OIL) 1200 MG CAPS Take 1 capsule by mouth daily.      . Marland Kitchenmeprazole (PRILOSEC) 40 MG capsule Take 1 capsule (40 mg total) by mouth daily. 90 capsule 1  . ondansetron (ZOFRAN) 8 MG tablet Take 1 tablet (8 mg total) by mouth 2 (two) times daily as needed. Start on the third day after chemotherapy. 30 tablet 1  . oxybutynin (DITROPAN-XL) 5 MG 24 hr tablet Take 1 tablet (5 mg total) by mouth daily. 90 tablet 1  . oxyCODONE-acetaminophen (ROXICET) 5-325 MG per tablet 1-2 tablets q 4h prn 20 tablet 0  . PROAIR HFA 108 (90 BASE) MCG/ACT inhaler INHALE 2 PUFFS INTO THE LUNGS EVERY 6 HOURS AS NEEDED 25.5 g 1  . prochlorperazine (COMPAZINE) 10 MG tablet   1  . rivaroxaban (XARELTO) 20 MG TABS tablet Take 1 tablet (20 mg total) by mouth daily with supper.  30 tablet 2  . simvastatin (ZOCOR) 40 MG tablet TAKE 1 TABLET DAILY 90 tablet 1  . traMADol (ULTRAM) 50 MG tablet Take 1 tablet (50 mg total) by mouth every 6 (six) hours as needed. 30 tablet 0  . XARELTO STARTER PACK 15 & 20 MG TBPK     . anastrozole (ARIMIDEX) 1 MG tablet Take 1 tablet (1 mg total) by mouth daily. 90 tablet 3   No current facility-administered medications for this visit.    PHYSICAL EXAMINATION: ECOG PERFORMANCE STATUS: 1 - Symptomatic but completely ambulatory  Filed Vitals:   11/20/14 1007  BP: 142/86  Pulse: 72  Temp: 98.1 F (36.7 C)  Resp: 18   Filed Weights   11/20/14 1007  Weight: 256 lb 8 oz (116.348 kg)    GENERAL:alert, no distress and comfortable SKIN: skin color, texture, turgor are normal, no rashes or significant lesions EYES: normal, Conjunctiva are pink and non-injected, sclera clear OROPHARYNX:no exudate, no erythema and lips, buccal mucosa, and tongue normal  NECK: supple, thyroid normal size, non-tender, without nodularity LYMPH:  no palpable  lymphadenopathy in the cervical, axillary or inguinal LUNGS: clear to auscultation and percussion with normal breathing effort HEART: regular rate & rhythm and no murmurs and no lower extremity edema ABDOMEN:abdomen soft, non-tender and normal bowel sounds Musculoskeletal:no cyanosis of digits and no clubbing  NEURO: alert & oriented x 3 with fluent speech, no focal motor/sensory deficits  LABORATORY DATA:  I have reviewed the data as listed   Chemistry      Component Value Date/Time   NA 141 10/20/2014 1119   NA 138 09/16/2014 1140   K 4.7 10/20/2014 1119   K 3.8 09/16/2014 1140   CL 103 09/16/2014 1140   CO2 27 10/20/2014 1119   CO2 27 09/16/2014 1140   BUN 12.8 10/20/2014 1119   BUN 16 09/16/2014 1140   CREATININE 0.8 10/20/2014 1119   CREATININE 0.75 09/16/2014 1140   CREATININE 0.83 05/31/2011 0905      Component Value Date/Time   CALCIUM 9.9 10/20/2014 1119   CALCIUM 8.8  09/16/2014 1140   ALKPHOS 64 10/20/2014 1119   ALKPHOS 52 08/11/2013 0918   AST 29 10/20/2014 1119   AST 19 08/11/2013 0918   ALT 31 10/20/2014 1119   ALT 24 08/11/2013 0918   BILITOT 0.39 10/20/2014 1119   BILITOT 0.4 08/11/2013 0918       Lab Results  Component Value Date   WBC 4.6 10/20/2014   HGB 13.3 10/20/2014   HCT 41.9 10/20/2014   MCV 81.3 10/20/2014   PLT 238 10/20/2014   NEUTROABS 3.2 10/20/2014   ASSESSMENT & PLAN:  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 M0 stage IIB  Chemotherapy summary: Adjuvant chemotherapy Completed 4 cycles of dose dense Adriamycin and Cytoxan. 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 Palbociclib: If she was randomized to Palbociclib, I discussed the risks and benefits of Ibrance including myelosuppression especially neutropenia and with that risk of infection, there is risk of pulmonary embolism and mild peripheral neuropathy as well. Fatigue, nausea, diarrhea, decreased appetite as well as alopecia and thrombocytopenia are also potential side  effects of Palbociclib.  Return to clinic in one month   Orders Placed This Encounter  Procedures  . DG Bone Density    Standing Status: Future     Number of Occurrences:      Standing Expiration Date: 11/20/2015    Order Specific Question:  Reason for Exam (SYMPTOM  OR DIAGNOSIS REQUIRED)    Answer:  Post menopausal osteoporosis starting anti estrogen therapy    Order Specific Question:  Is the patient pregnant?    Answer:  No    Order Specific Question:  Preferred imaging location?    Answer:  [facility]   The patient has a good understanding of the overall plan.  she agrees with it. she will call with any problems that may develop before the next visit here.   Rulon Eisenmenger, MD

## 2014-11-20 NOTE — Telephone Encounter (Signed)
Gave avs & calendar for July °

## 2014-11-24 ENCOUNTER — Ambulatory Visit: Payer: BLUE CROSS/BLUE SHIELD | Attending: Radiation Oncology | Admitting: Physical Therapy

## 2014-11-24 DIAGNOSIS — I890 Lymphedema, not elsewhere classified: Secondary | ICD-10-CM | POA: Diagnosis present

## 2014-11-24 DIAGNOSIS — E8989 Other postprocedural endocrine and metabolic complications and disorders: Secondary | ICD-10-CM | POA: Insufficient documentation

## 2014-11-24 NOTE — Therapy (Signed)
[place], [place], [address] Phone: [phone]   Fax:  [phone]  Physical Therapy Treatment  Patient Details  Name: Michelle Kennedy MRN: [id number] Date of Birth: 04-Jul-1955 Referring Provider:  [name], MD  Encounter Date: 11/24/2014      PT End of Session - 11/24/14 1605    Visit Number 7   Number of Visits 24   Date for PT Re-Evaluation 12/29/14   PT Start Time 6644   PT Stop Time 1550   PT Time Calculation (min) 35 min   Activity Tolerance Patient tolerated treatment well   Behavior During Therapy [facility] for tasks assessed/performed      Past Medical History  Diagnosis Date  . GERD (gastroesophageal reflux disease)   . Hyperlipidemia   . History of seizure age 81    as a reaction to Penicillin - no seizures since  . Migraines   . Arthritis     hips and knees  . History of gastric ulcer     as a teenager  . UTI (lower urinary tract infection) 02/17/2014  . Fibromyalgia   . Asthma     daily inhaler, prn inhaler and neb.  Marland Kitchen History of thyroid cancer     s/p thyroidectomy  . Breast cancer 01/2014    left  . Family history of anesthesia complication     twin brother aspirated and died on OR table, per pt.  . Abnormally small mouth   . Dental bridge present     upper front and lower right  . Dental crowns present     x 3  . Hypothyroidism     Past Surgical History  Procedure Laterality Date  . Cholecystectomy  1990  . Breast surgery  2011    left breast biposy  . Appendectomy  2004  . Tonsillectomy and adenoidectomy  2000  . Abdominal hysterectomy  2004    complete  . Thyroidectomy  2000  . Colonoscopy w/ polypectomy  06/2009  . Tumor excision      from thoracic spine  . Incontinence surgery  2004  . Achilles tendon repair Right   . Orif toe fracture Right     great toe  . Knee arthroscopy Bilateral     x 6 each knee  . Ligament repair Right     thumb/wrist  .  Eye surgery Right 2013    exc. warts from underneath eyelid  . Breast lumpectomy with needle localization and axillary sentinel lymph node bx Left 02/23/2014    Procedure: BREAST LUMPECTOMY WITH NEEDLE LOCALIZATION AND AXILLARY SENTINEL LYMPH NODE BIOPSY;  Surgeon: Excell Seltzer, MD;  Location: [place];  Service: General;  Laterality: Left;  . Portacath placement N/A 03/24/2014    Procedure: INSERTION PORT-A-CATH;  Surgeon: Excell Seltzer, MD;  Location: [place];  Service: General;  Laterality: N/A;    There were no vitals filed for this visit.  Visit Diagnosis:  Lymphedema of upper extremity following lymphadenectomy      Subjective Assessment - 11/24/14 1530    Subjective pt comes in with juzo arm sleeve and glove 30-40 mg. . She also had deloped a seroma that currently has a drain in.  She is having surgery this week to have port removed and possibly stitch the seroma She continues to get a flat knit sleeve and glove and get a tg soft at night. she is happy she was able to knit some  Pertinent History lumpectomy with 5 nodes biopsy 02-23-14, completed chemo 08/17/2013, will start radiation april 21. She has lots of allergies, carries an epi pen  pt is left handed   Patient Stated Goals to have arm swelling reduced.  she wants to knit without a problem    Currently in Pain? No/denies               LYMPHEDEMA/ONCOLOGY QUESTIONNAIRE - 11/24/14 1540    Left Upper Extremity Lymphedema   15 cm Proximal to Olecranon Process 39 cm   10 cm Proximal to Olecranon Process 34.8 cm   Olecranon Process 30 cm   15 cm Proximal to Ulnar Styloid Process 31 cm   10 cm Proximal to Ulnar Styloid Process 28.7 cm   Just Proximal to Ulnar Styloid Process 21.5 cm   Across Hand at [employer] 21.9 cm   At Gravity of 2nd Digit 7.5 cm           Quick Dash - 11/24/14 0001    Open a tight or new jar Mild difficulty   Do heavy household chores (wash walls,  wash floors) Moderate difficulty   Carry a shopping bag or briefcase Mild difficulty   Wash your back Mild difficulty   Use a knife to cut food No difficulty   Recreational activities in which you take some force or impact through your arm, shoulder, or hand (golf, hammering, tennis) Mild difficulty   During the past week, to what extent has your arm, shoulder or hand problem interfered with your normal social activities with family, friends, neighbors, or groups? Slightly   During the past week, to what extent has your arm, shoulder or hand problem limited your work or other regular daily activities Slightly   Arm, shoulder, or hand pain. Mild   Tingling (pins and needles) in your arm, shoulder, or hand Mild   Difficulty Sleeping No difficulty   DASH Score 22.73 %                          Short Term Clinic Goals - 11/24/14 1553    CC Short Term Goal  #1   Title Patient will verbalize understanding of lymphedema risk reduction practices   Status Achieved   CC Short Term Goal  #2   Title Patient will be independent in self manual lymph drainage   Status Achieved             Long Term Clinic Goals - 11/24/14 1553    CC Long Term Goal  #1   Title Patient will have a circumferential reduction of     1  cm at   15   cm above the   ulnar styloid    Baseline 32 baseline, 31.2 11/13/2014 31 cm 11/24/2014   Status Achieved   CC Long Term Goal  #2   Title Patient will have a circumferential reduction of      1 cm at      15    cm above the   olecranon process   Baseline 41.5 baseline,  40.6 at 6/.24/2016 , 39 at 11/24/2014   Status Achieved   CC Long Term Goal  #3   Title Patient will be know how to obtain and use compression garments for maintenance phase of treatment   Status Achieved   CC Long Term Goal  #4   Title Patient will decrease the DASH score to < 20  to demonstrate increased functional use of upper extremity   Baseline 50 at evaluation, 22.73 at discharge    Status Partially Met            Plan - 11/24/14 1605    Clinical Impression Statement pt is doing well with self management of lypmhedema with self manual lymph drainage, use of day garments and tg soft for nighttime compression and exercise.  She feels confident that she will be able to continue management at home.  Remeasured arm,completed and reinforced home management and assessed for goals. She has achieved all goals except for Katina Dung and she still has some decreased function of her arm.   PT Next Visit Plan Discharge this episode    Consulted and Agree with Plan of Care Patient        Problem List Patient Active Problem List   Diagnosis Date Noted  . DVT (deep venous thrombosis) 08/28/2014  . Lymphedema of arm 08/28/2014  . Mucositis due to chemotherapy 06/02/2014  . UTI symptoms 02/17/2014  . Menopausal syndrome (hot flashes) 12/31/2013  . Breast cancer of upper-outer quadrant of left female breast 12/26/2013  . 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 Gingivitis 12/05/2013  . Seasonal allergies 09/24/2013  . Skin lesion 06/03/2013  . Menopause 06/02/2013  . Elevated blood-pressure reading without diagnosis of hypertension 03/05/2013  . Cough 07/24/2012  . Left knee injury 11/02/2011  . Gout 09/19/2011  . Hyperlipidemia 06/02/2011  . Migraine 06/02/2011  . Tinnitus of both ears 02/14/2011  . Left knee pain 09/23/2010  . FIBROMYALGIA 05/10/2010  . Asthma 03/22/2010  . Hypothyroidism 12/20/2009  . OVERWEIGHT 12/20/2009  . GERD 12/20/2009  . DEPRESSION 12/01/2009   PHYSICAL THERAPY DISCHARGE SUMMARY  Visits from Start of Care: 7  Current functional level related to goals / functional outcomes: Pt is independent in self management of lymphedema and was even able to knit some (her goal)   Remaining deficits: Mild lymphedema, moderate difficulty with household chores and mild pain and arm disability    Education / Equipment: Self  manual lymph drainage, use of compression, exercise, lymphedema risk reduction practices Plan: Patient agrees to discharge.  Patient goals were partially met. Patient is being discharged due to the physician's request.  ?????          Donato Heinz. Owens Shark, PT   11/24/2014, 4:10 PM  [place], [place], [address] Phone: [phone]   Fax:  [phone]

## 2014-11-24 NOTE — Patient Instructions (Signed)
Reviewed use of circular knit compression garments for daytime and tg soft at night,  Pt to use 1/4 foam wrist to help pad thumb area which was too tight Continue elevation and exercise along with self manual lymph drainage.

## 2014-11-25 ENCOUNTER — Ambulatory Visit: Payer: BLUE CROSS/BLUE SHIELD | Admitting: Physical Therapy

## 2014-11-26 ENCOUNTER — Encounter: Payer: BLUE CROSS/BLUE SHIELD | Admitting: Physical Therapy

## 2014-11-27 ENCOUNTER — Encounter: Payer: Self-pay | Admitting: Behavioral Health

## 2014-11-27 ENCOUNTER — Telehealth: Payer: Self-pay | Admitting: Behavioral Health

## 2014-11-27 NOTE — Telephone Encounter (Signed)
Pre-Visit Call completed with patient and chart updated.   Pre-Visit Info documented in Specialty Comments under [employer].    

## 2014-11-30 ENCOUNTER — Ambulatory Visit: Payer: BLUE CROSS/BLUE SHIELD | Admitting: Physical Therapy

## 2014-11-30 ENCOUNTER — Encounter: Payer: Self-pay | Admitting: Family

## 2014-11-30 ENCOUNTER — Ambulatory Visit (INDEPENDENT_AMBULATORY_CARE_PROVIDER_SITE_OTHER): Payer: BLUE CROSS/BLUE SHIELD | Admitting: Family

## 2014-11-30 VITALS — BP 116/84 | HR 91 | Temp 97.7°F | Resp 18 | Ht 66.25 in | Wt 256.0 lb

## 2014-11-30 DIAGNOSIS — Z0000 Encounter for general adult medical examination without abnormal findings: Secondary | ICD-10-CM | POA: Diagnosis not present

## 2014-11-30 NOTE — Progress Notes (Signed)
Pre visit review using our clinic review tool, if applicable. No additional management support is needed unless otherwise documented below in the visit note. 

## 2014-11-30 NOTE — Patient Instructions (Addendum)
Please complete lab work prior to leaving. Keep up the good work with healthy diet, exercise weight loss. Please schedule bone density test.  Follow up in 6 months.

## 2014-11-30 NOTE — Progress Notes (Signed)
Subjective:    Patient ID: Michelle Kennedy, female    DOB: [birth year], 59 y.o.   MRN: [id number]  HPI   [name] is a 59 yr old female who presents today for cpx.  Immunizations: tetanus up to date, due for zostavax- but currently starting a trial of antiestrogen therapy +/- palbociclib.   Diet: diet is healthy Exercise: started on treadmill 1.5 weeks ago.   Plans to start swimming.  Colonoscopy: 2011 Dexa: ordered Pap Smear: hystrectomy Mammogram: 7/16 Dental: up to date Vision: will complete 2 months after radiation completion.     Review of Systems  Constitutional:       Wt Readings from Last 3 Encounters: 11/30/14 : 256 lb (116.121 kg) 11/20/14 : 256 lb 8 oz (116.348 kg) 10/26/14 : 256 lb 9.6 oz (116.393 kg)    HENT: Negative for rhinorrhea.   Respiratory: Negative for cough.   Cardiovascular: Negative for leg swelling.  Gastrointestinal: Negative for diarrhea and constipation.  Genitourinary: Negative for dysuria and frequency.  Musculoskeletal: Negative for arthralgias.       Mild fibromyalgia pain only- at baseline  Skin: Negative for rash.  Neurological: Negative for headaches.  Hematological: Negative for adenopathy.  Psychiatric/Behavioral:       Denies depression/anxiety   Past Medical History  Diagnosis Date  . GERD (gastroesophageal reflux disease)   . Hyperlipidemia   . History of seizure age 16    as a reaction to Penicillin - no seizures since  . Migraines   . Arthritis     hips and knees  . History of gastric ulcer     as a teenager  . UTI (lower urinary tract infection) 02/17/2014  . Fibromyalgia   . Asthma     daily inhaler, prn inhaler and neb.  Marland Kitchen History of thyroid cancer     s/p thyroidectomy  . Breast cancer 01/2014    left  . Family history of anesthesia complication     twin brother aspirated and died on OR table, per pt.  . Abnormally small mouth   . Dental bridge present     upper front and lower right  . Dental crowns present      x 3  . Hypothyroidism     History   Social History  . Marital Status: Married    Spouse Name: N/A  . Number of Children: N/A  . 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 Smoking status: Never Smoker   . Smokeless tobacco: Never Used  . Alcohol Use: No  . Drug Use: No  . Sexual Activity: Not on file     Comment: menarche age 12, P 2, first birth age 46, menopause age 47, Premarin x 10 yrs   Other Topics Concern  . Not on file   Social History Narrative   Regular exercise: yes          Past Surgical History  Procedure Laterality Date  . Cholecystectomy  1990  . Breast surgery  2011    left breast biposy  . Appendectomy  2004  . Tonsillectomy and adenoidectomy  2000  . Abdominal hysterectomy  2004    complete  . Thyroidectomy  2000  . Colonoscopy w/ polypectomy  06/2009  . Tumor excision      from thoracic spine  . Incontinence surgery  2004  . Achilles tendon repair Right   . Orif toe fracture Right  great toe  . Knee arthroscopy Bilateral     x 6 each knee  . Ligament repair Right     thumb/wrist  . Eye surgery Right 2013    exc. warts from underneath eyelid  . Breast lumpectomy with needle localization and axillary sentinel lymph node bx Left 02/23/2014    Procedure: BREAST LUMPECTOMY WITH NEEDLE LOCALIZATION AND AXILLARY SENTINEL LYMPH NODE BIOPSY;  Surgeon: Excell Seltzer, MD;  Location: [facility];  Service: General;  Laterality: Left;  . Portacath placement N/A 03/24/2014    Procedure: INSERTION PORT-A-CATH;  Surgeon: Excell Seltzer, MD;  Location: [facility];  Service: General;  Laterality: N/A;    Family History  Problem Relation Age of Onset  . Alcohol abuse Mother   . Arthritis Mother   . Hypertension Mother   . Bipolar disorder Mother   . Breast cancer Mother 20    unconfirmed  . Lung cancer Mother 64    smoker  . Alcohol abuse Father   . Cancer  Father     lung  . Hyperlipidemia Father   . Kidney disease Father   . Diabetes Father   . Arthritis Maternal Grandmother   . Diabetes Paternal Grandmother   . Thyroid cancer Sister 64    type?; currently 35  . Other Sister     ovarian tumor @ 34; TAH/BSO  . Thyroid cancer Brother     dx 73s; currently 73  . Breast cancer Maternal Aunt     dx 74s; deceased 38  . Thyroid cancer Paternal Aunt     All 3 paternal aunts with thyroid ca in 30s/40s  . Lung cancer Paternal Aunt     2 of 3 paternal aunts with lung cancer    Allergies  Allergen Reactions  . Bee Venom Anaphylaxis  . Contrast Media [Iodinated Diagnostic Agents] Shortness Of Breath  . Iodine Other (See Comments)    CARDIAC ARREST  . Latex Anaphylaxis and Rash  . Penicillins Shortness Of Breath, Rash and Other (See Comments)    SEIZURE  . Shellfish Allergy Shortness Of Breath  . Aspirin Rash and Other (See Comments)    GI UPSET  . Erythromycin Swelling and Rash    SWELLING OF JOINTS  . Lidocaine Swelling    SWELLING OF MOUTH AND THROAT  . Symbicort [Budesonide-Formoterol Fumarate] Other (See Comments)    BURNING OF TONGUE AND LIPS  . Codeine Rash  . Compazine [Prochlorperazine Maleate] Rash    Rash on face,chest, arms, back  . Pentazocine Lactate Rash  . Sulfonamide Derivatives Rash    Current Outpatient Prescriptions on File Prior to Visit  Medication Sig Dispense Refill  . albuterol (PROVENTIL) (5 MG/ML) 0.5% nebulizer solution Take 2.5 mg by nebulization every 6 (six) hours as needed for wheezing or shortness of breath.    . anastrozole (ARIMIDEX) 1 MG tablet Take 1 tablet (1 mg total) by mouth daily. 90 tablet 3  . Calcium Carbonate (CALCIUM 500 PO) Take 1 tablet by mouth daily.     Marland Kitchen dexamethasone (DECADRON) 4 MG tablet Take 2 tablets by mouth once a day on the day after chemotherapy and then take 2 tablets two times a day for 2 days. Take with food. 30 tablet 1  . diphenhydrAMINE (BENADRYL) 25 MG tablet  Take 1 tablet an hour before MRI.  Then take 1 tablet at bedtime as needed, per your usual regimen. 30 tablet 0  . DULoxetine (CYMBALTA) 60 MG capsule Take 1  capsule (60 mg total) by mouth daily. 90 capsule 0  . EPINEPHrine 0.3 mg/0.3 mL IJ SOAJ injection Inject 0.3 mLs (0.3 mg total) into the muscle once. 2 Device 0  . Ergocalciferol (VITAMIN D2) 400 UNITS TABS Take 1 tablet by mouth daily.    . Fluticasone-Salmeterol (ADVAIR DISKUS) 250-50 MCG/DOSE AEPB Inhale 1 puff into the lungs 2 (two) times daily. 180 each 1  . levothyroxine (SYNTHROID, LEVOTHROID) 150 MCG tablet Take 1 tablet (150 mcg total) by mouth daily. 30 tablet 1  . levothyroxine (SYNTHROID, LEVOTHROID) 175 MCG tablet Take 175 mcg by mouth daily. Take an additional half tablet =87.63mcg on Monday and Friday (total 262.61mcg)    . LORazepam (ATIVAN) 0.5 MG tablet Take 1 tablet (0.5 mg total) by mouth every 6 (six) hours as needed (Nausea or vomiting). 30 tablet 0  . meclizine (ANTIVERT) 50 MG tablet Take 0.5 tablets (25 mg total) by mouth 3 (three) times daily as needed for dizziness or nausea. 21 tablet 0  . meloxicam (MOBIC) 7.5 MG tablet Take 1 tablet (7.5 mg total) by mouth daily. 90 tablet 1  . Multiple Vitamin (MULTIVITAMIN) tablet Take 1 tablet by mouth daily.      . nitroGLYCERIN (NITROSTAT) 0.4 MG SL tablet Place 1 tablet (0.4 mg total) under the tongue every 5 (five) minutes as needed for chest pain. 30 tablet 0  . Omega-3 Fatty Acids (FISH OIL) 1200 MG CAPS Take 1 capsule by mouth daily.      Marland Kitchen omeprazole (PRILOSEC) 40 MG capsule Take 1 capsule (40 mg total) by mouth daily. 90 capsule 1  . oxybutynin (DITROPAN-XL) 5 MG 24 hr tablet Take 1 tablet (5 mg total) by mouth daily. 90 tablet 1  . oxyCODONE-acetaminophen (ROXICET) 5-325 MG per tablet 1-2 tablets q 4h prn 20 tablet 0  . PROAIR HFA 108 (90 BASE) MCG/ACT inhaler INHALE 2 PUFFS INTO THE LUNGS EVERY 6 HOURS AS NEEDED 25.5 g 1  . rivaroxaban (XARELTO) 20 MG TABS tablet Take 1  tablet (20 mg total) by mouth daily with supper. 30 tablet 2  . simvastatin (ZOCOR) 40 MG tablet TAKE 1 TABLET DAILY 90 tablet 1  . traMADol (ULTRAM) 50 MG tablet Take 1 tablet (50 mg total) by mouth every 6 (six) hours as needed. 30 tablet 0  . XARELTO STARTER PACK 15 & 20 MG TBPK      No current facility-administered medications on file prior to visit.    BP 116/84 mmHg  Pulse 91  Temp(Src) 97.7 F (36.5 C) (Oral)  Resp 18  Ht 5' 6.25" (1.683 m)  Wt 256 lb (116.121 kg)  BMI 41.00 kg/m2  SpO2 96%       Objective:   Physical Exam  Physical Exam  Constitutional: She is oriented to person, place, and time. She appears well-developed and well-nourished. No distress.  HENT:  Head: Normocephalic and atraumatic.  Right Ear: Tympanic membrane and ear canal normal.  Left Ear: Tympanic membrane and ear canal normal.  Mouth/Throat: Oropharynx is clear and moist.  Eyes: Pupils are equal, round, and reactive to light. No scleral icterus.  Neck: Normal range of motion. No thyromegaly present.  Cardiovascular: Normal rate and regular rhythm.   No murmur heard. Pulmonary/Chest: Effort normal and breath sounds normal. No respiratory distress. He has no wheezes. She has no rales. She exhibits no tenderness.  Abdominal: Soft. Bowel sounds are normal. He exhibits no distension and no mass. There is no tenderness. There is no rebound and  no guarding.  Musculoskeletal: She exhibits no edema.  Lymphadenopathy:    She has no cervical adenopathy.  Neurological: She is alert and oriented to person, place, and time. She has normal patellar reflexes. She exhibits normal muscle tone. Coordination normal.  Skin: Skin is warm and dry.  Psychiatric: She has a normal mood and affect. Her behavior is normal. Judgment and thought content normal.  Breasts: Examined lying Right: thickening noted left upper/outer breast (radiation site) Without masses, retractions, discharge or axillary adenopathy.  Left:  Without masses, retractions, discharge or axillary adenopathy.         Assessment & Plan:         Assessment & Plan:  EKG tracing is personally reviewed.  EKG notes NSR.  No acute changes.

## 2014-12-01 ENCOUNTER — Other Ambulatory Visit: Payer: BLUE CROSS/BLUE SHIELD

## 2014-12-01 ENCOUNTER — Telehealth: Payer: Self-pay | Admitting: Family

## 2014-12-01 DIAGNOSIS — Z0000 Encounter for general adult medical examination without abnormal findings: Secondary | ICD-10-CM | POA: Insufficient documentation

## 2014-12-01 LAB — URINALYSIS, ROUTINE W REFLEX MICROSCOPIC
BILIRUBIN URINE: NEGATIVE
HGB URINE DIPSTICK: NEGATIVE
Ketones, ur: NEGATIVE
Nitrite: NEGATIVE
RBC / HPF: NONE SEEN (ref 0–?)
Specific Gravity, Urine: 1.02 (ref 1.000–1.030)
Total Protein, Urine: NEGATIVE
Urine Glucose: NEGATIVE
Urobilinogen, UA: 0.2 (ref 0.0–1.0)
pH: 6 (ref 5.0–8.0)

## 2014-12-01 LAB — LIPID PANEL
CHOLESTEROL: 182 mg/dL (ref 0–200)
HDL: 50 mg/dL (ref >39.00)
LDL Cholesterol: 102 mg/dL — ABNORMAL HIGH (ref 0–99)
NonHDL: 132
Total CHOL/HDL Ratio: 4
Triglycerides: 148 mg/dL (ref 0.0–149.0)
VLDL: 29.6 mg/dL (ref 0.0–40.0)

## 2014-12-01 NOTE — Telephone Encounter (Signed)
Left message for pt to return my call.

## 2014-12-01 NOTE — Telephone Encounter (Signed)
Urine shows possible UTI.  Is she having dysuria/frequency. If so, I will rx abx if not, no need to treat unless symptomatic. Cholesterol looks good.

## 2014-12-01 NOTE — Telephone Encounter (Signed)
Notified pt and she confirms urinary frequency. Advised pt we would send Rx to pharmacy and to check with them tomorrow.  Please advise what Rx to send?

## 2014-12-01 NOTE — Assessment & Plan Note (Signed)
Pt to schedule dexa, zostavax if ok with oncology- I sent a staff message to her oncologist to see if ok from their standpoint with her study drug.   Continue healthy diet, exercise weight loss efforts.  Obtain routine labs.

## 2014-12-02 ENCOUNTER — Ambulatory Visit: Payer: BLUE CROSS/BLUE SHIELD | Admitting: Physical Therapy

## 2014-12-02 MED ORDER — CIPROFLOXACIN HCL 250 MG PO TABS: 250.0000 mg | ORAL_TABLET | Freq: Two times a day (BID) | ORAL | Status: DC

## 2014-12-02 NOTE — Telephone Encounter (Signed)
Rx sent for cipro. Also see mychart message please.

## 2014-12-02 NOTE — Telephone Encounter (Signed)
-----   Message from Nicholas Lose, MD sent at 12/01/2014  2:44 PM EDT ----- Hello Dr.O'Sullivan Ms.Lukes can get the vaccine. Thanks for reaching out to me Loleta Dicker ----- Message -----    From: Debbrah Alar, NP    Sent: 11/30/2014   4:54 PM      To: Nicholas Lose, MD  Hello,  I am the PCP for Ms. Haggar. Would it be safe in your opinion for her to receive a zostavax vaccine on her current study drug?  Thanks,  Debbrah Alar NP

## 2014-12-02 NOTE — Addendum Note (Signed)
Addended by: Debbrah Alar on: 12/02/2014 12:34 PM   Modules accepted: Orders

## 2014-12-04 ENCOUNTER — Ambulatory Visit: Payer: BLUE CROSS/BLUE SHIELD | Admitting: Physical Therapy

## 2014-12-04 ENCOUNTER — Ambulatory Visit
Admission: RE | Admit: 2014-12-04 | Discharge: 2014-12-04 | Disposition: A | Discharge status: Home / Self Care | Payer: BLUE CROSS/BLUE SHIELD | Source: Ambulatory Visit | Attending: Radiation Oncology | Admitting: Radiation Oncology

## 2014-12-04 VITALS — BP 138/80 | HR 77 | Temp 97.5°F | Wt 256.4 lb

## 2014-12-04 DIAGNOSIS — C50412 Malignant neoplasm of upper-outer quadrant of left female breast: Secondary | ICD-10-CM

## 2014-12-04 NOTE — Progress Notes (Signed)
Routine one month follow up of radiation to left breast on 10/27/14.Denies pain.Skin is completely healed just mild tanning of skin exists.Patient has started anastrozole and will have fu with Dr. Lindi Adie the end of the month.Started on cipro for uti.states she has noticed mild itching after she takes anastrozole which last about 1 to 2 hours and then goes away.

## 2014-12-04 NOTE — Progress Notes (Signed)
Radiation Oncology         [phone] ________________________________  Name: Michelle Kennedy MRN: [id number]  Date: 12/04/2014  DOB: 03-Dec-1955  Follow-Up Visit Note  outpatient  CC: Nance Pear., NP  Nicholas Lose, MD  Diagnosis and Prior Radiotherapy:    ICD-9-CM ICD-10-CM   1. Breast cancer of upper-outer quadrant of left female breast 174.4 C50.412    Clinical: Stage IIB (T3, N0, cM0)  Pathologic: Stage IIB (T2, N1a, cM0) Left Breast invasive ductal carcinoma, ER/PR +, Her2-, TF5732% (UOQ)  Radiation treatment dates:   09/10/2014-10/27/2014 Site/dose:   1) Left Breast / 50.4 Gy in 28 fractions 2) Left Supraclavicular fossa and Mid Axilla / 45 Gy in 25 fractions 3) Left Breast boost / 10 Gy in 5 fractions   Narrative:  The patient returns today for routine follow-up. Denies pain. Patient has started anastrozole and will have fu with Dr. Lindi Adie the end of the month. Started on cipro for uti. States she has noticed mild itching after she takes anastrozole which last about 1 to 2 hours and then goes away. LE in arm is better.  ALLERGIES:  is allergic to bee venom; contrast media; iodine; latex; penicillins; shellfish allergy; aspirin; erythromycin; lidocaine; symbicort; codeine; compazine; pentazocine lactate; and sulfonamide derivatives.  Meds: Current Outpatient Prescriptions  Medication Sig Dispense Refill  . albuterol (PROVENTIL) (5 MG/ML) 0.5% nebulizer solution Take 2.5 mg by nebulization every 6 (six) hours as needed for wheezing or shortness of breath.    . anastrozole (ARIMIDEX) 1 MG tablet Take 1 tablet (1 mg total) by mouth daily. 90 tablet 3  . Calcium Carbonate (CALCIUM 500 PO) Take 1 tablet by mouth daily.     . ciprofloxacin (CIPRO) 250 MG tablet Take 1 tablet (250 mg total) by mouth 2 (two) times daily. 6 tablet 0  . diphenhydrAMINE (BENADRYL) 25 MG tablet Take 1 tablet an hour before MRI.  Then take 1 tablet at bedtime as needed, per your usual regimen. 30  tablet 0  . DULoxetine (CYMBALTA) 60 MG capsule Take 1 capsule (60 mg total) by mouth daily. 90 capsule 0  . EPINEPHrine 0.3 mg/0.3 mL IJ SOAJ injection Inject 0.3 mLs (0.3 mg total) into the muscle once. 2 Device 0  . Ergocalciferol (VITAMIN D2) 400 UNITS TABS Take 1 tablet by mouth daily.    . Fluticasone-Salmeterol (ADVAIR DISKUS) 250-50 MCG/DOSE AEPB Inhale 1 puff into the lungs 2 (two) times daily. 180 each 1  . levothyroxine (SYNTHROID, LEVOTHROID) 175 MCG tablet Take 175 mcg by mouth daily. Take an additional half tablet =87.92mg on Monday and Friday (total 262.551m)    . LORazepam (ATIVAN) 0.5 MG tablet Take 1 tablet (0.5 mg total) by mouth every 6 (six) hours as needed (Nausea or vomiting). 30 tablet 0  . meclizine (ANTIVERT) 50 MG tablet Take 0.5 tablets (25 mg total) by mouth 3 (three) times daily as needed for dizziness or nausea. 21 tablet 0  . meloxicam (MOBIC) 7.5 MG tablet Take 1 tablet (7.5 mg total) by mouth daily. 90 tablet 1  . Multiple Vitamin (MULTIVITAMIN) tablet Take 1 tablet by mouth daily.      . nitroGLYCERIN (NITROSTAT) 0.4 MG SL tablet Place 1 tablet (0.4 mg total) under the tongue every 5 (five) minutes as needed for chest pain. 30 tablet 0  . Omega-3 Fatty Acids (FISH OIL) 1200 MG CAPS Take 1 capsule by mouth daily.      . Marland Kitchenmeprazole (PRILOSEC) 40 MG capsule Take 1 capsule (  40 mg total) by mouth daily. 90 capsule 1  . oxyCODONE-acetaminophen (ROXICET) 5-325 MG per tablet 1-2 tablets q 4h prn 20 tablet 0  . PROAIR HFA 108 (90 BASE) MCG/ACT inhaler INHALE 2 PUFFS INTO THE LUNGS EVERY 6 HOURS AS NEEDED 25.5 g 1  . rivaroxaban (XARELTO) 20 MG TABS tablet Take 1 tablet (20 mg total) by mouth daily with supper. 30 tablet 2  . simvastatin (ZOCOR) 40 MG tablet TAKE 1 TABLET DAILY 90 tablet 1  . XARELTO STARTER PACK 15 & 20 MG TBPK     . dexamethasone (DECADRON) 4 MG tablet Take 2 tablets by mouth once a day on the day after chemotherapy and then take 2 tablets two times a day  for 2 days. Take with food. (Patient not taking: Reported on 12/04/2014) 30 tablet 1  . oxybutynin (DITROPAN-XL) 5 MG 24 hr tablet Take 1 tablet (5 mg total) by mouth daily. (Patient not taking: Reported on 12/04/2014) 90 tablet 1  . traMADol (ULTRAM) 50 MG tablet Take 1 tablet (50 mg total) by mouth every 6 (six) hours as needed. (Patient not taking: Reported on 12/04/2014) 30 tablet 0   No current facility-administered medications for this encounter.    Physical Findings: The patient is in no acute distress. Patient is alert and oriented.  weight is 256 lb 6.4 oz (116.302 kg). Her temperature is 97.5 F (36.4 C). Her blood pressure is 138/80 and her pulse is 77.   Moderate edema in the left arm. She has now wear her ring. Skin has healed well in the radiation treatment fields.  Lab Findings: Lab Results  Component Value Date   WBC 4.6 10/20/2014   HGB 13.3 10/20/2014   HCT 41.9 10/20/2014   MCV 81.3 10/20/2014   PLT 238 10/20/2014    Radiographic Findings: No results found.  Impression/Plan: Healed well from RT for breast cancer. I encouraged her to continue with yearly mammography and followup with medical oncology. I will see her back on an as-needed basis. I have encouraged her to call if she has any issues or concerns in the future. I wished her the very best.   This document serves as a record of services personally performed by [name], MD. It was created on her behalf by Darcus Austin, a trained medical scribe. The creation of this record is based on the scribe's personal observations and the provider's statements to them. This document has been checked and approved by the attending provider.     _____________________________________   [name], MD

## 2014-12-05 ENCOUNTER — Other Ambulatory Visit: Payer: Self-pay | Admitting: Family

## 2014-12-07 ENCOUNTER — Other Ambulatory Visit: Payer: Self-pay

## 2014-12-07 NOTE — Progress Notes (Signed)
Photon Boost Complex Emergency planning/management officer Note  Diagnosis: Breast Cancer  The patient's CT images from her free-breathing simulation were reviewed to plan her boost treatment to her left breast  lumpectomy cavity.  The boost to the lumpectomy cavity will be delivered with 3 photon fields using MLCs for custom blocks against heart and lungs, with 15 and 6 MV photon energy.  This constitutes 3 complex treatment devices. Isodose plan was reviewed and approved. 10 Gy in 5 fractions prescribed.  -----------------------------------  [name], MD

## 2014-12-08 ENCOUNTER — Other Ambulatory Visit: Payer: Self-pay | Admitting: *Deleted

## 2014-12-08 DIAGNOSIS — M810 Age-related osteoporosis without current pathological fracture: Secondary | ICD-10-CM

## 2014-12-14 ENCOUNTER — Other Ambulatory Visit: Payer: Self-pay | Admitting: Medical Oncology

## 2014-12-14 ENCOUNTER — Other Ambulatory Visit: Payer: Self-pay

## 2014-12-14 ENCOUNTER — Telehealth: Payer: Self-pay | Admitting: Hematology and Oncology

## 2014-12-14 DIAGNOSIS — M810 Age-related osteoporosis without current pathological fracture: Secondary | ICD-10-CM

## 2014-12-14 DIAGNOSIS — C50412 Malignant neoplasm of upper-outer quadrant of left female breast: Secondary | ICD-10-CM

## 2014-12-14 DIAGNOSIS — E2839 Other primary ovarian failure: Secondary | ICD-10-CM

## 2014-12-14 NOTE — Telephone Encounter (Signed)
Left message to confirm Bone Density 07/28

## 2014-12-17 ENCOUNTER — Encounter: Payer: BLUE CROSS/BLUE SHIELD | Admitting: Medical Oncology

## 2014-12-17 ENCOUNTER — Other Ambulatory Visit (HOSPITAL_BASED_OUTPATIENT_CLINIC_OR_DEPARTMENT_OTHER): Payer: BLUE CROSS/BLUE SHIELD

## 2014-12-17 ENCOUNTER — Ambulatory Visit
Admission: RE | Admit: 2014-12-17 | Discharge: 2014-12-17 | Disposition: A | Discharge status: Home / Self Care | Payer: BLUE CROSS/BLUE SHIELD | Source: Ambulatory Visit | Attending: Hematology and Oncology | Admitting: Hematology and Oncology

## 2014-12-17 DIAGNOSIS — C50412 Malignant neoplasm of upper-outer quadrant of left female breast: Secondary | ICD-10-CM | POA: Diagnosis not present

## 2014-12-17 DIAGNOSIS — M810 Age-related osteoporosis without current pathological fracture: Secondary | ICD-10-CM

## 2014-12-17 DIAGNOSIS — E2839 Other primary ovarian failure: Secondary | ICD-10-CM

## 2014-12-17 LAB — COMPREHENSIVE METABOLIC PANEL (CC13)
ALT: 28 U/L (ref 0–55)
ANION GAP: 7 meq/L (ref 3–11)
AST: 21 U/L (ref 5–34)
Albumin: 3.7 g/dL (ref 3.5–5.0)
Alkaline Phosphatase: 72 U/L (ref 40–150)
BUN: 12.8 mg/dL (ref 7.0–26.0)
CHLORIDE: 107 meq/L (ref 98–109)
CO2: 28 meq/L (ref 22–29)
CREATININE: 0.9 mg/dL (ref 0.6–1.1)
Calcium: 9.8 mg/dL (ref 8.4–10.4)
EGFR: 67 mL/min/{1.73_m2} — AB (ref >90)
Glucose: 101 mg/dl (ref 70–140)
Potassium: 4.7 mEq/L (ref 3.5–5.1)
Sodium: 142 mEq/L (ref 136–145)
TOTAL PROTEIN: 6.4 g/dL (ref 6.4–8.3)
Total Bilirubin: 0.32 mg/dL (ref 0.20–1.20)

## 2014-12-17 LAB — CBC WITH DIFFERENTIAL/PLATELET
BASO%: 0.4 % (ref 0.0–2.0)
BASOS ABS: 0 10*3/uL (ref 0.0–0.1)
EOS%: 2.1 % (ref 0.0–7.0)
Eosinophils Absolute: 0.1 10*3/uL (ref 0.0–0.5)
HCT: 45.2 % (ref 34.8–46.6)
HGB: 14.4 g/dL (ref 11.6–15.9)
LYMPH#: 1.3 10*3/uL (ref 0.9–3.3)
LYMPH%: 20.1 % (ref 14.0–49.7)
MCH: 24.5 pg — ABNORMAL LOW (ref 25.1–34.0)
MCHC: 31.7 g/dL (ref 31.5–36.0)
MCV: 77.1 fL — ABNORMAL LOW (ref 79.5–101.0)
MONO#: 0.6 10*3/uL (ref 0.1–0.9)
MONO%: 9.7 % (ref 0.0–14.0)
NEUT%: 67.7 % (ref 38.4–76.8)
NEUTROS ABS: 4.3 10*3/uL (ref 1.5–6.5)
Platelets: 251 10*3/uL (ref 145–400)
RBC: 5.86 10*6/uL — ABNORMAL HIGH (ref 3.70–5.45)
RDW: 15.6 % — ABNORMAL HIGH (ref 11.2–14.5)
WBC: 6.4 10*3/uL (ref 3.9–10.3)

## 2014-12-17 LAB — HEMOGLOBIN A1C
HEMOGLOBIN A1C: 5.8 % — AB (ref <5.7)
MEAN PLASMA GLUCOSE: 120 mg/dL — AB (ref <117)

## 2014-12-17 LAB — RESEARCH LABS

## 2014-12-17 NOTE — Assessment & Plan Note (Signed)
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 M0 stage IIB Chemotherapy summary: Adjuvant chemotherapy Completed 4 cycles of dose dense Adriamycin and Cytoxan. 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 Patient signed the consent form and we will soon randomize her.  Return to clinic for follow-up

## 2014-12-18 ENCOUNTER — Other Ambulatory Visit: Payer: Self-pay | Admitting: Medical Oncology

## 2014-12-18 ENCOUNTER — Other Ambulatory Visit: Payer: BLUE CROSS/BLUE SHIELD

## 2014-12-18 ENCOUNTER — Other Ambulatory Visit: Payer: Self-pay | Admitting: Hematology and Oncology

## 2014-12-18 ENCOUNTER — Encounter: Payer: Self-pay | Admitting: Hematology and Oncology

## 2014-12-18 ENCOUNTER — Ambulatory Visit (HOSPITAL_BASED_OUTPATIENT_CLINIC_OR_DEPARTMENT_OTHER): Payer: BLUE CROSS/BLUE SHIELD | Admitting: Hematology and Oncology

## 2014-12-18 VITALS — BP 147/87 | HR 80 | Temp 96.0°F | Resp 18 | Ht 66.25 in | Wt 255.4 lb

## 2014-12-18 DIAGNOSIS — I890 Lymphedema, not elsewhere classified: Secondary | ICD-10-CM

## 2014-12-18 DIAGNOSIS — C50412 Malignant neoplasm of upper-outer quadrant of left female breast: Secondary | ICD-10-CM

## 2014-12-18 DIAGNOSIS — Z85850 Personal history of malignant neoplasm of thyroid: Secondary | ICD-10-CM

## 2014-12-18 NOTE — Progress Notes (Signed)
[facility] NOTE  Patient Care Team: Debbrah Alar, NP as PCP - General (Internal Medicine) Excell Seltzer, MD as Consulting Physician (General Surgery) Nicholas Lose, MD as Consulting Physician (Hematology and Oncology) [name], MD as Attending Physician (Radiation Oncology) Katheran James., MD (Endocrinology)  CHIEF COMPLAINTS: Left breast cancer, [name] clinical trial history and physical   HISTORY OF PRESENTING ILLNESS:  Michelle Kennedy 59 y.o. female is here because of recent diagnosis of left breast cancer treated with lumpectomy followed by adjuvant chemotherapy and radiation. She started on anastrozole 1 mg daily 11/20/2014. She is tolerating anastrozole extremely well without any major problems or concerns. She occasional feels that the skin is slightly more sensitive especially to sun. She denies any muscle aches or pains.   I reviewed her records extensively and collaborated the history with the patient.  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 GERD (gastroesophageal reflux disease)   . Hyperlipidemia   . History of seizure age 59    as a reaction to Penicillin - no seizures since  . Migraines   . Arthritis     hips and knees  . History of gastric ulcer     as a teenager  . UTI (lower urinary tract infection) 02/17/2014  . Fibromyalgia   . Asthma     daily inhaler, prn inhaler and neb.  Marland Kitchen History of thyroid cancer     s/p thyroidectomy  . Breast cancer 01/2014    left  . Family history of anesthesia complication     twin brother aspirated and died on OR table, per pt.  . Abnormally small mouth   . Dental bridge present     upper front and lower right  . Dental crowns present     x 3  . Hypothyroidism     SURGICAL HISTORY: Past Surgical History  Procedure Laterality Date  . Cholecystectomy  1990  . Breast surgery  2011    left breast biposy  . Appendectomy  2004  . Tonsillectomy and adenoidectomy  2000  . Abdominal hysterectomy  2004    complete  . Thyroidectomy  2000  . Colonoscopy w/ polypectomy  06/2009  . Tumor excision      from thoracic spine  . Incontinence surgery  2004  . Achilles tendon repair Right   . Orif toe fracture Right     great toe  . Knee arthroscopy Bilateral  x 6 each knee  . Ligament repair Right     thumb/wrist  . Eye surgery Right 2013    exc. warts from underneath eyelid  . Breast lumpectomy with needle localization and axillary sentinel lymph node bx Left 02/23/2014    Procedure: BREAST LUMPECTOMY WITH NEEDLE LOCALIZATION AND AXILLARY SENTINEL LYMPH NODE BIOPSY;  Surgeon: Excell Seltzer, MD;  Location: [facility];  Service: General;  Laterality: Left;  . Portacath placement N/A 03/24/2014    Procedure: INSERTION PORT-A-CATH;  Surgeon: Excell Seltzer, MD;  Location: [facility];   Service: General;  Laterality: N/A;    SOCIAL HISTORY: History   Social History  . Marital Status: Married    Spouse Name: N/A  . Number of Children: N/A  . 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 Smoking status: Never Smoker   . Smokeless tobacco: Never Used  . Alcohol Use: No  . Drug Use: No  . Sexual Activity: Not on file     Comment: menarche age 61, P 2, first birth age 59, menopause age 62, Premarin x 10 yrs   Other Topics Concern  . Not on file   Social History Narrative   Regular exercise: yes          FAMILY HISTORY: Family History  Problem Relation Age of Onset  . Alcohol abuse Mother   . Arthritis Mother   . Hypertension Mother   . Bipolar disorder Mother   . Breast cancer Mother 23    unconfirmed  . Lung cancer Mother 72    smoker  . Alcohol abuse Father   . Cancer Father     lung  . Hyperlipidemia Father   . Kidney disease Father   . Diabetes Father   . Arthritis Maternal Grandmother   . Diabetes Paternal Grandmother   . Thyroid cancer Sister 38    type?; currently 57  . Other Sister     ovarian tumor @ 85; TAH/BSO  . Thyroid cancer Brother     dx 33s; currently 64  . Breast cancer Maternal Aunt     dx 42s; deceased 48  . Thyroid cancer Paternal Aunt     All 3 paternal aunts with thyroid ca in 30s/40s  . Lung cancer Paternal Aunt     2 of 3 paternal aunts with lung cancer    ALLERGIES:  is allergic to bee venom; contrast media; iodine; latex; penicillins; shellfish allergy; aspirin; erythromycin; lidocaine; symbicort; codeine; compazine; pentazocine lactate; and sulfonamide derivatives.  MEDICATIONS:  Current Outpatient Prescriptions  Medication Sig Dispense Refill  . albuterol (PROVENTIL) (5 MG/ML) 0.5% nebulizer solution Take 2.5 mg by nebulization every 6 (six) hours as needed for wheezing or shortness of breath.    . Calcium Carbonate (CALCIUM 500 PO) Take 1 tablet by mouth daily.      . diphenhydrAMINE (BENADRYL) 25 MG tablet Take 1 tablet an hour before MRI.  Then take 1 tablet at bedtime as needed, per your usual regimen. 30 tablet 0  . DULoxetine (CYMBALTA) 60 MG capsule TAKE 1 CAPSULE DAILY 90 capsule 1  . EPINEPHrine 0.3 mg/0.3 mL IJ SOAJ injection Inject 0.3 mLs (0.3 mg total) into the muscle once. 2 Device 0  . Ergocalciferol (VITAMIN D2) 400 UNITS TABS Take 1 tablet by mouth daily.    . Fluticasone-Salmeterol (ADVAIR DISKUS) 250-50 MCG/DOSE AEPB Inhale 1 puff into the lungs 2 (two) times daily.  180 each 1  . levothyroxine (SYNTHROID, LEVOTHROID) 175 MCG tablet Take 175 mcg by mouth daily. Take an additional half tablet =87.16mg on Monday and Friday (total 262.558m)    . LORazepam (ATIVAN) 0.5 MG tablet Take 1 tablet (0.5 mg total) by mouth every 6 (six) hours as needed (Nausea or vomiting). 30 tablet 0  . meclizine (ANTIVERT) 50 MG tablet Take 0.5 tablets (25 mg total) by mouth 3 (three) times daily as needed for dizziness or nausea. 21 tablet 0  . meloxicam (MOBIC) 7.5 MG tablet Take 1 tablet (7.5 mg total) by mouth daily. 90 tablet 1  . Multiple Vitamin (MULTIVITAMIN) tablet Take 1 tablet by mouth daily.      . nitroGLYCERIN (NITROSTAT) 0.4 MG SL tablet Place 1 tablet (0.4 mg total) under the tongue every 5 (five) minutes as needed for chest pain. 30 tablet 0  . Omega-3 Fatty Acids (FISH OIL) 1200 MG CAPS Take 1 capsule by mouth daily.      . Marland Kitchenmeprazole (PRILOSEC) 40 MG capsule Take 1 capsule (40 mg total) by mouth daily. 90 capsule 1  . oxybutynin (DITROPAN-XL) 5 MG 24 hr tablet Take 1 tablet (5 mg total) by mouth daily. 90 tablet 1  . oxyCODONE-acetaminophen (ROXICET) 5-325 MG per tablet 1-2 tablets q 4h prn 20 tablet 0  . PROAIR HFA 108 (90 BASE) MCG/ACT inhaler INHALE 2 PUFFS INTO THE LUNGS EVERY 6 HOURS AS NEEDED 25.5 g 1  . rivaroxaban (XARELTO) 20 MG TABS tablet Take 1 tablet (20 mg total) by mouth daily with supper. 30 tablet 2  . simvastatin (ZOCOR) 40 MG  tablet TAKE 1 TABLET DAILY 90 tablet 1  . traMADol (ULTRAM) 50 MG tablet Take 1 tablet (50 mg total) by mouth every 6 (six) hours as needed. 30 tablet 0   No current facility-administered medications for this visit.    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  PHYSICAL EXAMINATION: ECOG PERFORMANCE STATUS: 1 - Symptomatic but completely ambulatory  Filed Vitals:   12/18/14 0952  BP: 147/87  Pulse: 80  Temp: 96 F (35.6 C)  Resp: 18   Filed Weights   12/18/14 0952  Weight: 255 lb 6.4 oz (115.849 kg)    GENERAL:alert, no distress and comfortable SKIN: skin color, texture, turgor are normal, no rashes or significant lesions EYES: normal, conjunctiva are pink and non-injected, sclera clear OROPHARYNX:no exudate, no erythema and lips, buccal mucosa, and tongue normal  NECK: supple, thyroid normal size, non-tender, without nodularity LYMPH:  no palpable lymphadenopathy in the cervical, axillary or inguinal LUNGS: clear to auscultation and percussion with normal breathing effort HEART: regular rate & rhythm and no murmurs and no lower extremity edema ABDOMEN:abdomen soft, non-tender and normal bowel sounds Musculoskeletal:no cyanosis of digits and no clubbing  PSYCH: alert & oriented x  3 with fluent speech NEURO: no focal motor/sensory deficits BREAST: No palpable nodules in breast.  No palpable axillary or supraclavicular or infraclavicular lymphadenopathy (exam performed in the presence of a chaperone)   LABORATORY DATA:  I have reviewed the data as listed Lab Results  Component Value Date   WBC 6.4 12/17/2014   HGB 14.4 12/17/2014   HCT 45.2 12/17/2014   MCV 77.1* 12/17/2014   PLT 251 12/17/2014   Lab Results  Component Value Date   NA 142 12/17/2014   K 4.7 12/17/2014   CL 103 09/16/2014   CO2 28 12/17/2014    RADIOGRAPHIC STUDIES: Bone density showed normal bones T score -0.8  ASSESSMENT AND PLAN:  Breast cancer of upper-outer quadrant of left female breast: (Postmenopausal) Left breast invasive ductal carcinoma status post lumpectomy 3.8 cm, 1/4 SLN positive grade 2, ER 99%, PR 100%, HER-2 negative ratio 0.74; T2 N1 A. M0 stage IIB Chemotherapy summary: Adjuvant chemotherapy Completed 4 cycles of dose dense Adriamycin and Cytoxan. 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 Low likelihood of recurrence  Treatment plan: Adjuvant antiestrogen therapy with anastrozole once daily for 5 years Patient is agreeable to participate in [facility] study (antiestrogen therapy (5+ years) versus antiestrogen therapy plus Palbociclib (2 years). Patient signed the consent form and we will soon randomize her.  Adverse effects assessment: 1. Sun sensitivity of the skin  Return to clinic for follow-up   All questions were answered. The patient knows to call the clinic with any problems, questions or concerns.    Rulon Eisenmenger, MD 10:44 AM

## 2014-12-21 ENCOUNTER — Encounter: Payer: Self-pay | Admitting: Medical Oncology

## 2014-12-21 ENCOUNTER — Other Ambulatory Visit: Payer: BLUE CROSS/BLUE SHIELD

## 2014-12-21 ENCOUNTER — Telehealth: Payer: Self-pay | Admitting: Hematology and Oncology

## 2014-12-21 ENCOUNTER — Other Ambulatory Visit: Payer: Self-pay

## 2014-12-21 DIAGNOSIS — C50412 Malignant neoplasm of upper-outer quadrant of left female breast: Secondary | ICD-10-CM

## 2014-12-21 LAB — RESEARCH LABS

## 2014-12-21 MED ORDER — ANASTROZOLE 1 MG PO TABS: 1.0000 mg | ORAL_TABLET | Freq: Every day | ORAL | Status: DC

## 2014-12-21 MED ORDER — INV-PALBOCICLIB 125 MG CAPS #23 ALLIANCE FOUNDATION AFT-05 (PALLAS): 125.0000 mg | ORAL_CAPSULE | Freq: Every day | ORAL | Status: DC

## 2014-12-21 NOTE — Progress Notes (Signed)
[name] AFT 05: Cycle 1/Day 1 Patient here today for start of Cycle 1 Day 1. Patient with research required lab draw, PRO's and Palbociclib study drug dispense today. I met with patient during her lab appointment and research labs collected. PRO's completed with patient after her lab appointment in research audit room. I reviewed Palbociclib with patient and she knows to take with food at approximately the same time every day. I educated patient on drug diaries for both study drugs, Palbociclib and Antihormone therapy. Patient shown how to document the date, time, name and dosage of medication and number of pills taken and to document this each day. Patient educated on the event of missed or vomited medication and what to do should this happen. Patient educated on not to take any new medications prior to discussing with Dr. Lindi Adie, as well as, not to eat or drink grapefruit/grapefruit juice during palbociclib treatment. Patient educated on how to store dispensed medication and to return bottle along with any unused pills, as well as, her documented study calendars at her follow up appointment in one months time. Patient gave verbal understanding, all patient's questions answered and patient knows to call Dr. Lindi Adie or myself for any further questions or concerns she may have. Informed patient that she will have a lab appointment scheduled for two weeks from now and an MD visit in one month. Patient knows to expect call from schedulers to schedule these appointments. I informed patient that I will follow up with her tomorrow with phone call to see how she is doing.  Palbociclib 125 mg study medication dispensed to patient, along with drug diaries for both Palbociclib and Antihormone therapy. Patient to start Palbociclib today after lunch and states that she plans on taking it with breakfast, starting tomorrow going forward.  POF sent to schedulers for upcoming appts. Adele Dan, RN, BSN Clinical  Research 12/21/2014 3:42 PM

## 2014-12-21 NOTE — Telephone Encounter (Signed)
s.w. pt and advised on Aug appt....pt ok and aware °

## 2014-12-22 ENCOUNTER — Encounter: Payer: Self-pay | Admitting: Medical Oncology

## 2014-12-22 DIAGNOSIS — C50412 Malignant neoplasm of upper-outer quadrant of left female breast: Secondary | ICD-10-CM

## 2014-12-22 NOTE — Progress Notes (Signed)
[name]: AFT 05. Arm A.  Follow up call to patient. Yesterday patient started first dose of Palbociclib and today she took her second dose as instructed. Patient with no complaints and reports no new or unusual symptoms, states she has documented each does as instructed. Patient denies any questions and was encouraged to call clinic with any questions or concerns. I thanked patient for her time and her support of study.  Adele Dan, RN, BSN Clinical Research 12/22/2014 4:46 PM

## 2014-12-23 ENCOUNTER — Telehealth: Payer: Self-pay

## 2014-12-23 NOTE — Telephone Encounter (Signed)
Bone Density results dtd 12/17/14 rcvd from [facility].  Reviewed by Dr Lindi Adie.  Sent to scan.

## 2014-12-24 ENCOUNTER — Encounter: Payer: Self-pay | Admitting: Medical Oncology

## 2014-12-24 DIAGNOSIS — C50412 Malignant neoplasm of upper-outer quadrant of left female breast: Secondary | ICD-10-CM

## 2014-12-24 NOTE — Progress Notes (Signed)
[name]: AFT 05. Arm A.  Follow up call to patient today. Patient started first dose of Palbociclib on Monday, inquired with patient of any side effects, questions or concerns she may have. Patient with no complaints and reports no new or unusual symptoms, states she has documented each does as instructed. Patient denies any questions, I thanked patient for her time and encouraged her to call clinic with any questions or concerns. Patient gave verbal understanding.  Adele Dan, RN, BSN Clinical Research 12/22/2014 4:46 PM

## 2014-12-25 ENCOUNTER — Other Ambulatory Visit: Payer: Self-pay | Admitting: Family

## 2014-12-28 NOTE — Telephone Encounter (Signed)
Rx sent to pharmacy   

## 2014-12-29 ENCOUNTER — Encounter: Payer: Self-pay | Admitting: Medical Oncology

## 2015-01-01 ENCOUNTER — Other Ambulatory Visit: Payer: Self-pay | Admitting: Medical Oncology

## 2015-01-01 DIAGNOSIS — C50412 Malignant neoplasm of upper-outer quadrant of left female breast: Secondary | ICD-10-CM

## 2015-01-04 ENCOUNTER — Other Ambulatory Visit (HOSPITAL_BASED_OUTPATIENT_CLINIC_OR_DEPARTMENT_OTHER): Payer: BLUE CROSS/BLUE SHIELD

## 2015-01-04 ENCOUNTER — Encounter: Payer: Self-pay | Admitting: Medical Oncology

## 2015-01-04 ENCOUNTER — Telehealth: Payer: Self-pay | Admitting: Medical Oncology

## 2015-01-04 DIAGNOSIS — C50412 Malignant neoplasm of upper-outer quadrant of left female breast: Secondary | ICD-10-CM

## 2015-01-04 DIAGNOSIS — Z006 Encounter for examination for normal comparison and control in clinical research program: Secondary | ICD-10-CM

## 2015-01-04 LAB — CBC WITH DIFFERENTIAL/PLATELET
BASO%: 0.8 % (ref 0.0–2.0)
Basophils Absolute: 0 10*3/uL (ref 0.0–0.1)
EOS%: 2.1 % (ref 0.0–7.0)
Eosinophils Absolute: 0 10*3/uL (ref 0.0–0.5)
HCT: 40.3 % (ref 34.8–46.6)
HGB: 12.8 g/dL (ref 11.6–15.9)
LYMPH%: 36.2 % (ref 14.0–49.7)
MCH: 24.5 pg — ABNORMAL LOW (ref 25.1–34.0)
MCHC: 31.7 g/dL (ref 31.5–36.0)
MCV: 77.3 fL — AB (ref 79.5–101.0)
MONO#: 0.1 10*3/uL (ref 0.1–0.9)
MONO%: 5.9 % (ref 0.0–14.0)
NEUT%: 55 % (ref 38.4–76.8)
NEUTROS ABS: 1.1 10*3/uL — AB (ref 1.5–6.5)
Platelets: 196 10*3/uL (ref 145–400)
RBC: 5.22 10*6/uL (ref 3.70–5.45)
RDW: 15.9 % — ABNORMAL HIGH (ref 11.2–14.5)
WBC: 2.1 10*3/uL — AB (ref 3.9–10.3)
lymph#: 0.7 10*3/uL — ABNORMAL LOW (ref 0.9–3.3)

## 2015-01-04 NOTE — Telephone Encounter (Signed)
LVMOM: Called patient to confirm that she was aware of today's lab appointment at 13:30. Asked patient to call me with questions/concerns. I will f/u with patient after her lab appt with results.

## 2015-01-04 NOTE — Progress Notes (Signed)
[name] Study: Arm A: Cycle 1/Day 14 13:30 Met with patient this afternoon during her lab appointment. Inquired with her as to how she was doing, patient reported she "feels great", had a nice family vacation to [place] and confirms no new symptoms or concerns. Informed patient that I will call her with lab results this afternoon once resulted.  Patient gave verbal understanding. Denied any questions at this time.  1600: Called patient and informed her of CBC results. Neurtrophil count decreased to 1.1 (Grade 2 per CTCAE v. 4.0) and WBC decreased to 2.1 (Grade 2). Patient confirms no fever or symptoms of fever and I educated her on Neutropenic precautions. I encouraged patient to monitor her temperature and to call clinic with a temp of 100.5 or greater and any chills with or without a fever. Patient gave verbal understanding. Per study protocol, no intervention recommended at this time and will check labs again at next visit. Informed patient Dr. Lindi Adie out of office today, but I will in box him with her lab results. I confirmed with patient that she has been documenting study drug intake on her study provided calendar and that she has been taking study drug as directed. Patient was told to continue with study medication as directed.  All patient's questions answered to her satisfaction, denies further questions at this time. I confirmed her appointment for 08/29 and encouraged her to call Dr. Lindi Adie or myself with any questions/concerns. Dr. Lindi Adie inboxed with lab results. F/U appt 08/29 13:30 lab/MD Adele Dan, RN, BSN Clinical Research 01/04/2015 4:26 PM

## 2015-01-11 ENCOUNTER — Telehealth: Payer: Self-pay | Admitting: Medical Oncology

## 2015-01-11 ENCOUNTER — Other Ambulatory Visit: Payer: Self-pay | Admitting: Medical Oncology

## 2015-01-11 DIAGNOSIS — C50412 Malignant neoplasm of upper-outer quadrant of left female breast: Secondary | ICD-10-CM

## 2015-01-11 NOTE — Telephone Encounter (Signed)
[name] Study: F/U call to patient to inquire as to how she is feeling. Last weeks labs resulted with decreased neutrophils, grade 2 neutropenia. Patient reports she is doing well, no concerns and denies any new symptoms, denies fever as well and states she is "doing fine." Patient denies any questions at this time. I confirmed patient's next week appointment for labs and MD appointment and encouraged her to contact clinic should she experience any new symptoms or have questions/concerns.  Adele Dan, RN, BSN Clinical Research 01/11/2015 3:37 PM

## 2015-01-18 ENCOUNTER — Encounter: Payer: Self-pay | Admitting: Hematology and Oncology

## 2015-01-18 ENCOUNTER — Ambulatory Visit (HOSPITAL_BASED_OUTPATIENT_CLINIC_OR_DEPARTMENT_OTHER): Payer: BLUE CROSS/BLUE SHIELD | Admitting: Hematology and Oncology

## 2015-01-18 ENCOUNTER — Other Ambulatory Visit (HOSPITAL_BASED_OUTPATIENT_CLINIC_OR_DEPARTMENT_OTHER): Payer: BLUE CROSS/BLUE SHIELD

## 2015-01-18 ENCOUNTER — Encounter: Payer: Self-pay | Admitting: Medical Oncology

## 2015-01-18 VITALS — BP 140/71 | HR 68 | Temp 97.8°F | Resp 18 | Ht 66.25 in | Wt 259.8 lb

## 2015-01-18 DIAGNOSIS — C50412 Malignant neoplasm of upper-outer quadrant of left female breast: Secondary | ICD-10-CM

## 2015-01-18 DIAGNOSIS — Z006 Encounter for examination for normal comparison and control in clinical research program: Secondary | ICD-10-CM

## 2015-01-18 LAB — COMPREHENSIVE METABOLIC PANEL (CC13)
ALT: 23 U/L (ref 0–55)
ANION GAP: 7 meq/L (ref 3–11)
AST: 17 U/L (ref 5–34)
Albumin: 3.6 g/dL (ref 3.5–5.0)
Alkaline Phosphatase: 71 U/L (ref 40–150)
BUN: 12.5 mg/dL (ref 7.0–26.0)
CHLORIDE: 108 meq/L (ref 98–109)
CO2: 27 meq/L (ref 22–29)
CREATININE: 0.9 mg/dL (ref 0.6–1.1)
Calcium: 9.3 mg/dL (ref 8.4–10.4)
EGFR: 71 mL/min/{1.73_m2} — AB (ref >90)
Glucose: 105 mg/dl (ref 70–140)
POTASSIUM: 3.9 meq/L (ref 3.5–5.1)
Sodium: 142 mEq/L (ref 136–145)
Total Bilirubin: 0.26 mg/dL (ref 0.20–1.20)
Total Protein: 5.9 g/dL — ABNORMAL LOW (ref 6.4–8.3)

## 2015-01-18 LAB — CBC WITH DIFFERENTIAL/PLATELET
BASO%: 1.3 % (ref 0.0–2.0)
BASOS ABS: 0 10*3/uL (ref 0.0–0.1)
EOS%: 1.1 % (ref 0.0–7.0)
Eosinophils Absolute: 0 10*3/uL (ref 0.0–0.5)
HCT: 39.8 % (ref 34.8–46.6)
HGB: 12.7 g/dL (ref 11.6–15.9)
LYMPH%: 34.5 % (ref 14.0–49.7)
MCH: 24.6 pg — AB (ref 25.1–34.0)
MCHC: 31.8 g/dL (ref 31.5–36.0)
MCV: 77.2 fL — ABNORMAL LOW (ref 79.5–101.0)
MONO#: 0.3 10*3/uL (ref 0.1–0.9)
MONO%: 11.7 % (ref 0.0–14.0)
NEUT#: 1.5 10*3/uL (ref 1.5–6.5)
NEUT%: 51.4 % (ref 38.4–76.8)
Platelets: 220 10*3/uL (ref 145–400)
RBC: 5.16 10*6/uL (ref 3.70–5.45)
RDW: 17.5 % — ABNORMAL HIGH (ref 11.2–14.5)
WBC: 2.9 10*3/uL — ABNORMAL LOW (ref 3.9–10.3)
lymph#: 1 10*3/uL (ref 0.9–3.3)

## 2015-01-18 MED ORDER — INV-PALBOCICLIB 125 MG CAPS #23 ALLIANCE FOUNDATION AFT-05 (PALLAS): 125.0000 mg | ORAL_CAPSULE | Freq: Every day | ORAL | Status: DC

## 2015-01-18 NOTE — Assessment & Plan Note (Signed)
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 M0 stage IIB Adjuvant chemotherapy Completed 4 cycles of dose dense Adriamycin and Cytoxan. 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 Low likelihood of recurrence  Treatment plan: Adjuvant antiestrogen therapy with anastrozole once daily for 5 years started 11/20/2014 + Ibrance ([name] trial) Ibrance toxicities: 1. Grade 2 neutropenia: Did not require dose reduction, close monitoring of blood counts 2. Otherwise no major side effects to [employer]

## 2015-01-18 NOTE — Progress Notes (Signed)
Patient Care Team: Debbrah Alar, NP as PCP - General (Internal Medicine) Excell Seltzer, MD as Consulting Physician (General Surgery) Nicholas Lose, MD as Consulting Physician (Hematology and Oncology) [name], MD as Attending Physician (Radiation Oncology) Katheran James., MD (Endocrinology)  DIAGNOSIS: Breast cancer of upper-outer quadrant of left female breast   Staging form: Breast, AJCC 7th Edition     Clinical: Stage IIB (T3, N0, cM0) - Unsigned       Staging comments: Staged at breast conference 12/31/13.      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 with lymphovascular invasion margins negative one out of 4 SLN positive, ER 99%, PR 100%, HER-2 negative ratio 0.74, T2, N1, M0 stage IIB   03/19/2014 PET scan No evidence of distant metastatic disease   03/31/2014 - 08/18/2014 Chemotherapy Adjuvant chemotherapy with dose dense Adriamycin Cytoxan x4 followed by Abraxane weekly x12   08/28/2014 Imaging Left brachial vein thrombus   09/11/2014 -  Radiation Therapy Adjuvant radiation therapy   11/20/2014 -  Anti-estrogen oral therapy Anastrozole 1 mg daily + Ibrance ([name] clinical trial)    CHIEF COMPLIANT: Follow-up on Ibrance with anastrozole cycle 2 day 1  INTERVAL HISTORY: Michelle Kennedy is a 59 year old with above-mentioned history of left breast cancer who received lumpectomy followed by  adjuvant chemotherapy and is now on oral antiestrogen therapy. She has been tolerating anastrozole and Ibrance extremely well without any major problems. Blood counts done on 12/17/2014. A white count 6.4 with an ANC of 4.3. She is tolerating Ibrance extremely well. Blood counts done in August revealed grade 2 neutropenia which did not require dose adjustment.  REVIEW OF SYSTEMS:   Constitutional: Denies fevers, chills or abnormal weight loss Eyes: Denies blurriness of vision Ears, nose, mouth, throat, and face: Denies mucositis or sore throat Respiratory: Denies cough, dyspnea or wheezes Cardiovascular: Denies palpitation, chest discomfort or lower extremity swelling Gastrointestinal:  Denies nausea, heartburn or change in bowel habits Skin: Denies abnormal skin rashes Lymphatics: Denies new lymphadenopathy or easy bruising Neurological:Denies numbness, tingling or new weaknesses Behavioral/Psych: Mood is stable, no new changes  Breast:  denies any pain or lumps or nodules in either breasts All other systems were reviewed with the patient and are negative.  I have reviewed the past medical history, past surgical history, social history and family history with the patient and they are unchanged from previous note.  ALLERGIES:  is allergic to bee venom; contrast media; iodine; latex; penicillins; shellfish allergy; aspirin; erythromycin; lidocaine; symbicort; codeine; compazine; pentazocine lactate; and sulfonamide derivatives.  MEDICATIONS:  Current Outpatient Prescriptions  Medication Sig Dispense Refill  . albuterol (PROVENTIL) (5 MG/ML) 0.5% nebulizer solution Take 2.5 mg by nebulization every 6 (six) hours as needed for wheezing or shortness of breath.    . anastrozole (ARIMIDEX) 1 MG tablet Take 1 tablet (1 mg total) by mouth daily. 90 tablet 3  . Calcium Carbonate (CALCIUM 500  PO) Take 1 tablet by mouth daily.     . diphenhydrAMINE (BENADRYL) 25 MG tablet Take 1 tablet an hour before  MRI.  Then take 1 tablet at bedtime as needed, per your usual regimen. 30 tablet 0  . DULoxetine (CYMBALTA) 60 MG capsule TAKE 1 CAPSULE DAILY 90 capsule 1  . EPINEPHrine 0.3 mg/0.3 mL IJ SOAJ injection Inject 0.3 mLs (0.3 mg total) into the muscle once. 2 Device 0  . Ergocalciferol (VITAMIN D2) 400 UNITS TABS Take 1 tablet by mouth daily.    . Fluticasone-Salmeterol (ADVAIR DISKUS) 250-50 MCG/DOSE AEPB Inhale 1 puff into the lungs 2 (two) times daily. 180 each 1  . Investigational palbociclib (IBRANCE) 125 MG capsule [facility] AFT-05 [name] Take 1 capsule (125 mg total) by mouth daily. Take with food. Swallow whole. Do not chew. Take on days 1-21. Repeat every 28 days. 23 capsule 0  . levothyroxine (SYNTHROID, LEVOTHROID) 175 MCG tablet Take 175 mcg by mouth daily. Take an additional half tablet =87.86mg on Monday and Friday (total 262.528m)    . LORazepam (ATIVAN) 0.5 MG tablet Take 1 tablet (0.5 mg total) by mouth every 6 (six) hours as needed (Nausea or vomiting). 30 tablet 0  . meclizine (ANTIVERT) 50 MG tablet Take 0.5 tablets (25 mg total) by mouth 3 (three) times daily as needed for dizziness or nausea. 21 tablet 0  . meloxicam (MOBIC) 7.5 MG tablet TAKE 1 TABLET DAILY 90 tablet 1  . Multiple Vitamin (MULTIVITAMIN) tablet Take 1 tablet by mouth daily.      . nitroGLYCERIN (NITROSTAT) 0.4 MG SL tablet Place 1 tablet (0.4 mg total) under the tongue every 5 (five) minutes as needed for chest pain. 30 tablet 0  . Omega-3 Fatty Acids (FISH OIL) 1200 MG CAPS Take 1 capsule by mouth daily.      . Marland Kitchenmeprazole (PRILOSEC) 40 MG capsule Take 1 capsule (40 mg total) by mouth daily. 90 capsule 1  . oxybutynin (DITROPAN-XL) 5 MG 24 hr tablet Take 1 tablet (5 mg total) by mouth daily. 90 tablet 1  . oxyCODONE-acetaminophen (ROXICET) 5-325 MG per tablet 1-2 tablets q 4h prn 20 tablet 0  . PROAIR HFA 108 (90 BASE) MCG/ACT inhaler INHALE 2 PUFFS INTO THE LUNGS EVERY 6 HOURS AS NEEDED 25.5 g 1  .  rivaroxaban (XARELTO) 20 MG TABS tablet Take 1 tablet (20 mg total) by mouth daily with supper. 30 tablet 2  . simvastatin (ZOCOR) 40 MG tablet TAKE 1 TABLET DAILY 90 tablet 1  . traMADol (ULTRAM) 50 MG tablet Take 1 tablet (50 mg total) by mouth every 6 (six) hours as needed. 30 tablet 0   No current facility-administered medications for this visit.    PHYSICAL EXAMINATION: ECOG PERFORMANCE STATUS: 0 - Asymptomatic  Filed Vitals:   01/18/15 1341  BP: 140/71  Pulse: 68  Temp: 97.8 F (36.6 C)  Resp: 18   Filed Weights   01/18/15 1341  Weight: 259 lb 12.8 oz (117.845 kg)    GENERAL:alert, no distress and comfortable SKIN: skin color, texture, turgor are normal, no rashes or significant lesions EYES: normal, Conjunctiva are pink and non-injected, sclera clear OROPHARYNX:no exudate, no erythema and lips, buccal mucosa, and tongue normal  NECK: supple, thyroid normal size, non-tender, without nodularity LYMPH:  no palpable lymphadenopathy in the cervical, axillary or inguinal LUNGS: clear to auscultation and percussion with normal breathing effort HEART: regular rate & rhythm and no murmurs and no lower extremity edema ABDOMEN:abdomen soft, non-tender  and normal bowel sounds Musculoskeletal:no cyanosis of digits and no clubbing  NEURO: alert & oriented x 3 with fluent speech, no focal motor/sensory deficits BREAST: No palpable masses or nodules in either right or left breasts. No palpable axillary supraclavicular or infraclavicular adenopathy no breast tenderness or nipple discharge. (exam performed in the presence of a chaperone)  LABORATORY DATA:  I have reviewed the data as listed   Chemistry      Component Value Date/Time   NA 142 12/17/2014 1133   NA 138 09/16/2014 1140   K 4.7 12/17/2014 1133   K 3.8 09/16/2014 1140   CL 103 09/16/2014 1140   CO2 28 12/17/2014 1133   CO2 27 09/16/2014 1140   BUN 12.8 12/17/2014 1133   BUN 16 09/16/2014 1140   CREATININE 0.9  12/17/2014 1133   CREATININE 0.75 09/16/2014 1140   CREATININE 0.83 05/31/2011 0905      Component Value Date/Time   CALCIUM 9.8 12/17/2014 1133   CALCIUM 8.8 09/16/2014 1140   ALKPHOS 72 12/17/2014 1133   ALKPHOS 52 08/11/2013 0918   AST 21 12/17/2014 1133   AST 19 08/11/2013 0918   ALT 28 12/17/2014 1133   ALT 24 08/11/2013 0918   BILITOT 0.32 12/17/2014 1133   BILITOT 0.4 08/11/2013 0918       Lab Results  Component Value Date   WBC 2.9* 01/18/2015   HGB 12.7 01/18/2015   HCT 39.8 01/18/2015   MCV 77.2* 01/18/2015   PLT 220 01/18/2015   NEUTROABS 1.5 01/18/2015   ASSESSMENT & PLAN:  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 M0 stage IIB Adjuvant chemotherapy Completed 4 cycles of dose dense Adriamycin and Cytoxan. 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 Grade 2 neutropenia: Did not require dose reduction, close monitoring of blood counts 2. Otherwise no major side effects to Ibrance 3. 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 Is staying active.  No orders of the defined types were placed in this encounter.   The patient has a good understanding of the overall plan. she agrees with  it. she will call with any problems that may develop before the next visit here.   Rulon Eisenmenger, MD

## 2015-01-18 NOTE — Progress Notes (Signed)
[name]: Cycle 2/Day 1 Additional note to today's appointment: Patient informed me that the Palbociclib medication bottle that was dispensed to her 08/01 for Cycle 1 contained 23 capsules, 2 additional in case of loss provided by study, patient reports she accidentally dropped one capsule down the sink and her husband accidentally dropped another while trying to help patient. Patient confirms that she has taken 21 capsules as instructed.  Adele Dan, RN, BSN Clinical Research 01/18/2015 4:24 PM

## 2015-01-18 NOTE — Progress Notes (Signed)
[name]: Cycle 2/Day 1 Patient here today for labs, MD visit and study drug medication dispensing. I met with patient after completion of study required questionnaires with research assistant Marthann Schiller and study required routine lab draws. Patient's VSS and maintaining weight @ 117.8 kg.  Review of patient's medications and she denies any new medications.  Drug Diaries for Palbociclib and Antihormone therapy collected from patient as well as empty study drug bottle, Palbociclib, dispensed for Cycle 1. Patient reports and has documented, taking as directed, 21 capsules of Palbociclib and continues to take Arimidix daily, as instructed by MD, with no missed doses. Patient does report to have had some new pain to her right hip that started approximately 4 days ago and rates that pain at a 3 on scale of 10, as an ache, states "feels locked in place." Patient denies nausea or vomiting, denies bowel or bladder concerns, denies SOB or any other new adverse events at today's visit. Patient also reports to be swimming daily. Today's labs resulted with ANC returning to WNL. Per Dr. Geralyn Flash assessment of patient and review of labs and labs out of normal range as NCS and per MD patient to proceed with Cycle 2 continuing with current dosage of 166m Palbociclib. All patient's questions answered to her satisfaction and she denies further questions at this time. Patient was provided with new Drug Diaries and was dispensed a new bottle of Palbociclib, as instructed by study, provided by pharmacist BMeredith Leeds I encouraged patient to call Dr. GLindi Adieor myself with any questions or concerns she may have. Patient confirmed return appointment for labs 09/12. LAdele Dan RN, BSN Clinical Research 01/18/2015 3:11 PM

## 2015-01-18 NOTE — Progress Notes (Signed)
[name] Patient into the cancer center for routine visit.  Patient completed her questionnaires before any study procedures were performed.  I checked the questionnaires for completeness.  I thanked the patient for her continued support of this clinical trial. Barb Danil Wedge 01/18/2015 1:28 PM

## 2015-01-28 ENCOUNTER — Other Ambulatory Visit: Payer: Self-pay | Admitting: Medical Oncology

## 2015-01-28 DIAGNOSIS — C50412 Malignant neoplasm of upper-outer quadrant of left female breast: Secondary | ICD-10-CM

## 2015-02-01 ENCOUNTER — Other Ambulatory Visit (HOSPITAL_BASED_OUTPATIENT_CLINIC_OR_DEPARTMENT_OTHER): Payer: BLUE CROSS/BLUE SHIELD

## 2015-02-01 ENCOUNTER — Telehealth: Payer: Self-pay | Admitting: Medical Oncology

## 2015-02-01 DIAGNOSIS — C50412 Malignant neoplasm of upper-outer quadrant of left female breast: Secondary | ICD-10-CM | POA: Diagnosis not present

## 2015-02-01 DIAGNOSIS — Z006 Encounter for examination for normal comparison and control in clinical research program: Secondary | ICD-10-CM | POA: Diagnosis not present

## 2015-02-01 LAB — CBC WITH DIFFERENTIAL/PLATELET
BASO%: 0.7 % (ref 0.0–2.0)
BASOS ABS: 0 10*3/uL (ref 0.0–0.1)
EOS ABS: 0.1 10*3/uL (ref 0.0–0.5)
EOS%: 1.7 % (ref 0.0–7.0)
HCT: 40 % (ref 34.8–46.6)
HGB: 12.9 g/dL (ref 11.6–15.9)
LYMPH%: 32.7 % (ref 14.0–49.7)
MCH: 25.5 pg (ref 25.1–34.0)
MCHC: 32.3 g/dL (ref 31.5–36.0)
MCV: 79.2 fL — AB (ref 79.5–101.0)
MONO#: 0.2 10*3/uL (ref 0.1–0.9)
MONO%: 6.7 % (ref 0.0–14.0)
NEUT%: 58.2 % (ref 38.4–76.8)
NEUTROS ABS: 1.7 10*3/uL (ref 1.5–6.5)
Platelets: 241 10*3/uL (ref 145–400)
RBC: 5.05 10*6/uL (ref 3.70–5.45)
RDW: 17.8 % — ABNORMAL HIGH (ref 11.2–14.5)
WBC: 3 10*3/uL — AB (ref 3.9–10.3)
lymph#: 1 10*3/uL (ref 0.9–3.3)

## 2015-02-01 LAB — COMPREHENSIVE METABOLIC PANEL (CC13)
ALT: 23 U/L (ref 0–55)
AST: 19 U/L (ref 5–34)
Albumin: 3.7 g/dL (ref 3.5–5.0)
Alkaline Phosphatase: 68 U/L (ref 40–150)
Anion Gap: 6 mEq/L (ref 3–11)
BUN: 14 mg/dL (ref 7.0–26.0)
CO2: 28 meq/L (ref 22–29)
Calcium: 9.6 mg/dL (ref 8.4–10.4)
Chloride: 107 mEq/L (ref 98–109)
Creatinine: 0.9 mg/dL (ref 0.6–1.1)
EGFR: 73 mL/min/{1.73_m2} — AB (ref >90)
GLUCOSE: 91 mg/dL (ref 70–140)
POTASSIUM: 4.4 meq/L (ref 3.5–5.1)
Sodium: 141 mEq/L (ref 136–145)
Total Bilirubin: 0.31 mg/dL (ref 0.20–1.20)
Total Protein: 6.4 g/dL (ref 6.4–8.3)

## 2015-02-01 NOTE — Telephone Encounter (Signed)
[name]: Cycle 2/day 14 LVMOM: Called patient to inform her of lab results and to f/u with patient to inquire as to how she is, and any questions or concerns she may have. Requested that patient call me back at her earliest convenience. Adele Dan, RN, BSN Clinical Research 02/01/2015 4:44 PM

## 2015-02-02 ENCOUNTER — Telehealth: Payer: Self-pay | Admitting: Medical Oncology

## 2015-02-02 NOTE — Telephone Encounter (Signed)
[name]: Cylce 2/Day 14 Return call from patient. Informed patient of her lab 9/13 results and that all her labs look good and that WBC continues to be slightly low @ 3.0. Patient gave verbal understanding. Patient does confirm to be having continuing ache to her right hip and states that it has not worsened since last reported, but does persist. Inquired with patient if she is taking any pain medication and patient reports to be taking tylenol as needed. Patient states she will discuss with Dr. Lindi Adie at her next appointment with him on the 26th, I did inform patient of some of the side effects with arimidex such as muscle/joint pain and this may be related to the arimidex and this seemed to make her feel better. Patient denies any other concerns or new symptoms at this time. Patient confirms to be taking her study drug, Palbociclib as instructed, and without complications and knows to continue taking as directed. All patient's questions answered to her satisfaction, patient denies further questions at this time and confirmed upcoming appointment. I thanked patient for her time and encouraged her to contact Dr. Lindi Adie or myself with any new or worsening symptoms or concerns she may have. Mssg forward to Dr. Lindi Adie. Adele Dan, RN, BSN Clinical Research 02/02/2015 1:35 PM

## 2015-02-09 ENCOUNTER — Other Ambulatory Visit: Payer: Self-pay | Admitting: Medical Oncology

## 2015-02-09 DIAGNOSIS — C50412 Malignant neoplasm of upper-outer quadrant of left female breast: Secondary | ICD-10-CM

## 2015-02-13 ENCOUNTER — Other Ambulatory Visit: Payer: Self-pay | Admitting: Family

## 2015-02-14 NOTE — Assessment & Plan Note (Signed)
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 M0 stage IIB Adjuvant chemotherapy Completed 4 cycles of dose dense Adriamycin and Cytoxan. 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 Grade 2 neutropenia: Did not require dose reduction, close monitoring of blood counts 2. Otherwise no major side effects to Ibrance 3. 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 Is staying active. RTC

## 2015-02-15 ENCOUNTER — Other Ambulatory Visit (HOSPITAL_BASED_OUTPATIENT_CLINIC_OR_DEPARTMENT_OTHER): Payer: BLUE CROSS/BLUE SHIELD

## 2015-02-15 ENCOUNTER — Encounter: Payer: Self-pay | Admitting: Medical Oncology

## 2015-02-15 ENCOUNTER — Encounter: Payer: Self-pay | Admitting: Hematology and Oncology

## 2015-02-15 ENCOUNTER — Telehealth: Payer: Self-pay | Admitting: Hematology and Oncology

## 2015-02-15 ENCOUNTER — Encounter: Payer: Self-pay | Admitting: Oncology

## 2015-02-15 ENCOUNTER — Ambulatory Visit (HOSPITAL_BASED_OUTPATIENT_CLINIC_OR_DEPARTMENT_OTHER): Payer: BLUE CROSS/BLUE SHIELD | Admitting: Hematology and Oncology

## 2015-02-15 VITALS — BP 144/87 | HR 89 | Temp 97.5°F | Resp 18 | Ht 66.25 in | Wt 257.4 lb

## 2015-02-15 DIAGNOSIS — Z006 Encounter for examination for normal comparison and control in clinical research program: Secondary | ICD-10-CM | POA: Diagnosis not present

## 2015-02-15 DIAGNOSIS — C50412 Malignant neoplasm of upper-outer quadrant of left female breast: Secondary | ICD-10-CM

## 2015-02-15 LAB — COMPREHENSIVE METABOLIC PANEL (CC13)
ALT: 26 U/L (ref 0–55)
ANION GAP: 8 meq/L (ref 3–11)
AST: 18 U/L (ref 5–34)
Albumin: 4 g/dL (ref 3.5–5.0)
Alkaline Phosphatase: 69 U/L (ref 40–150)
BUN: 17 mg/dL (ref 7.0–26.0)
CALCIUM: 10.3 mg/dL (ref 8.4–10.4)
CO2: 28 mEq/L (ref 22–29)
CREATININE: 1 mg/dL (ref 0.6–1.1)
Chloride: 105 mEq/L (ref 98–109)
EGFR: 61 mL/min/{1.73_m2} — ABNORMAL LOW (ref >90)
Glucose: 102 mg/dl (ref 70–140)
Potassium: 4.3 mEq/L (ref 3.5–5.1)
Sodium: 142 mEq/L (ref 136–145)
Total Bilirubin: 0.42 mg/dL (ref 0.20–1.20)
Total Protein: 6.7 g/dL (ref 6.4–8.3)

## 2015-02-15 LAB — CBC WITH DIFFERENTIAL/PLATELET
BASO%: 1.4 % (ref 0.0–2.0)
Basophils Absolute: 0.1 10*3/uL (ref 0.0–0.1)
EOS%: 1.3 % (ref 0.0–7.0)
Eosinophils Absolute: 0.1 10*3/uL (ref 0.0–0.5)
HEMATOCRIT: 42.1 % (ref 34.8–46.6)
HGB: 13.7 g/dL (ref 11.6–15.9)
LYMPH#: 1.1 10*3/uL (ref 0.9–3.3)
LYMPH%: 28.1 % (ref 14.0–49.7)
MCH: 25.8 pg (ref 25.1–34.0)
MCHC: 32.6 g/dL (ref 31.5–36.0)
MCV: 79.3 fL — ABNORMAL LOW (ref 79.5–101.0)
MONO#: 0.3 10*3/uL (ref 0.1–0.9)
MONO%: 7.7 % (ref 0.0–14.0)
NEUT%: 61.5 % (ref 38.4–76.8)
NEUTROS ABS: 2.4 10*3/uL (ref 1.5–6.5)
PLATELETS: 228 10*3/uL (ref 145–400)
RBC: 5.31 10*6/uL (ref 3.70–5.45)
RDW: 19.9 % — AB (ref 11.2–14.5)
WBC: 3.9 10*3/uL (ref 3.9–10.3)

## 2015-02-15 MED ORDER — INV-PALBOCICLIB 125 MG CAPS #23 ALLIANCE FOUNDATION AFT-05 (PALLAS): 125.0000 mg | ORAL_CAPSULE | Freq: Every day | ORAL | Status: DC

## 2015-02-15 NOTE — Telephone Encounter (Signed)
Called patient and she is aware of her December appointment °

## 2015-02-15 NOTE — Progress Notes (Signed)
[name] study - Patient into the cancer center for routine visit.  Patient given questionnaires upon arrival to the cancer center.  Patient completed questionnaires.  I checked the questionnaires for completeness. The patient was thanked for her continued support in this clinical trial. Remer Macho 02/15/15 - 1:45 pm

## 2015-02-15 NOTE — Progress Notes (Signed)
[name]: Cycle 3, Day 1 Met with patient in exam room after PRO's completed with research assistant Remer Macho and patient's lab appointment. Patient here today for Cycle 3 day 1 visit. Resting VS collected and stable, patient weight @ 116.7 kg. Reivew of patient's medications, and patient' reports no changes to her current medications. I collected patient's Drug Diaries from Cycle 2 and study dispensed Palbociclib bottle. Patient recorded in Drug Diary and reports no missed doses during Cycle 2 and has returned empty study dispensed bottle. Patient denies any concerns or difficulties in taking either Palbociclib or Arimidix. Patient continues to report Right hip pain "in the joint" and rates pain currently a 3 on 10 point scale and states worsens with activity and reports it can worsen up to a 7 in pain, reports pain worse when she is sedentary for extended periods and notices it is worse in the evening. Patient also reports mild muscle aches to bilateral shoulders, reports this ache has not interfered or stopped her from performing her usual activities. Patient denies bowel or bladder problems, denies nausea or vomiting, denies respiratory issues, denies appetite problems, denies hot flashes or sleeping problems. Patient continues to report non worsening neuropathy to tips of fingers and toes, and states she has noticed improvement in her fingertips to both hands. Per Dr. Geralyn Flash physical assessment of patient and review of today's  labs and NCS labs, patient to continue with Cycle 3, Cycle 4 and Cycle 5, per protocol, at 125 mg Palbociclib dosage. I provided patient with Drug Diaries for Palbociclib and Anti Hormone therapy for Cycle 3, Cycle 4 and Cycle 5 with stop and start dates and re-educated patient on self administration. Three new individual bottles for Cycle 3, 4 and 5 of Palbociclib containing 21 capsules in each bottle were dispensed to patient, provided by Pharmacist Meredith Leeds. Per protocol,  patient scheduled to return for Cycle 6 on May 10, 2015. All patient's questions answered to her satisfaction, patient denies further questions at this time. I encouraged patient to call clinic, Dr. Lindi Adie or myself with any questions or concerns she may have. Patient thanked for her time and continued support of study.  Adele Dan, RN, BSN Clinical Research 02/15/2015 3:33 PM

## 2015-02-15 NOTE — Progress Notes (Signed)
Patient Care Team: Debbrah Alar, NP as PCP - General (Internal Medicine) Excell Seltzer, MD as Consulting Physician (General Surgery) Nicholas Lose, MD as Consulting Physician (Hematology and Oncology) [name], MD as Attending Physician (Radiation Oncology) Katheran James., MD (Endocrinology)  DIAGNOSIS: Breast cancer of upper-outer quadrant of left female breast   Staging form: Breast, AJCC 7th Edition     Clinical: Stage IIB (T3, N0, cM0) - Unsigned       Staging comments: Staged at breast conference 12/31/13.      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 with lymphovascular invasion margins negative one out of 4 SLN positive, ER 99%, PR 100%, HER-2 negative ratio 0.74, T2, N1, M0 stage IIB   03/19/2014 PET scan No evidence of distant metastatic disease   03/31/2014 - 08/18/2014 Chemotherapy Adjuvant chemotherapy with dose dense Adriamycin Cytoxan x4 followed by Abraxane weekly x12   08/28/2014 Imaging Left brachial vein thrombus   09/11/2014 -  Radiation Therapy Adjuvant radiation therapy   11/20/2014 -  Anti-estrogen oral therapy Anastrozole 1 mg daily + Ibrance ([name] clinical trial)    CHIEF COMPLIANT:  Follow-up on Ibrance and anastrozole  INTERVAL HISTORY: Michelle Kennedy is a  59 year old with above-mentioned history of left breast cancer treated with lumpectomy and adjuvant chemotherapy.  She is currently on clinical trial [name]  With anastrozole and Ibrance. She is tolerating both of them extremely well. She complains of right hip pain especially when she sits sedentarily for a long time.  Her energy levels are back to normal. Denies any problems with nausea vomiting diarrhea or constipation. Neuropathy is improving and the fingers.  REVIEW OF SYSTEMS:   Constitutional: Denies fevers, chills or abnormal weight loss Eyes: Denies blurriness of vision Ears, nose, mouth, throat, and face: Denies mucositis or sore throat Respiratory: Denies cough, dyspnea or wheezes Cardiovascular: Denies palpitation, chest discomfort or lower extremity swelling Gastrointestinal:  Denies nausea, heartburn or change in bowel habits Skin: Denies abnormal skin rashes Lymphatics: Denies new lymphadenopathy or easy bruising Neurological: neuropathy in fingers is improving, neuropathy and the balls of the feet is stable. Behavioral/Psych: Mood is stable, no new changes  Breast:  denies any pain or lumps or nodules in either breasts All other systems were reviewed with the patient and are negative.  I have reviewed the past medical history, past surgical history, social history and family history with the patient and they are unchanged from previous note.  ALLERGIES:  is allergic to bee venom; contrast media; iodine; latex; penicillins; shellfish allergy; aspirin; erythromycin; lidocaine; symbicort; codeine; compazine; pentazocine lactate; and sulfonamide derivatives.  MEDICATIONS:  Current Outpatient Prescriptions  Medication Sig Dispense Refill  . albuterol (PROVENTIL) (5 MG/ML) 0.5% nebulizer solution Take 2.5 mg by nebulization every 6 (six) hours as needed for wheezing or shortness of breath.    . anastrozole (ARIMIDEX) 1 MG tablet Take 1 tablet (1 mg total) by mouth daily.  90 tablet 3  . Calcium Carbonate (CALCIUM 500 PO) Take 1 tablet by mouth daily.     . diphenhydrAMINE (BENADRYL) 25 MG  tablet Take 1 tablet an hour before MRI.  Then take 1 tablet at bedtime as needed, per your usual regimen. 30 tablet 0  . DULoxetine (CYMBALTA) 60 MG capsule TAKE 1 CAPSULE DAILY 90 capsule 1  . EPINEPHrine 0.3 mg/0.3 mL IJ SOAJ injection Inject 0.3 mLs (0.3 mg total) into the muscle once. 2 Device 0  . Ergocalciferol (VITAMIN D2) 400 UNITS TABS Take 1 tablet by mouth daily.    . Fluticasone-Salmeterol (ADVAIR DISKUS) 250-50 MCG/DOSE AEPB Inhale 1 puff into the lungs 2 (two) times daily. 180 each 1  . Investigational palbociclib (IBRANCE) 125 MG capsule [facility] AFT-05 [name] Take 1 capsule (125 mg total) by mouth daily. Take with food. Swallow whole. Do not chew. Take on days 1-21. Repeat every 28 days. 23 capsule 0  . Investigational palbociclib (IBRANCE) 125 MG capsule [facility] AFT-05 [name] Take 1 capsule (125 mg total) by mouth daily. Take with food. Swallow whole. Do not chew. Take on days 1-21. Repeat every 28 days. 23 capsule 0  . levothyroxine (SYNTHROID, LEVOTHROID) 175 MCG tablet Take 175 mcg by mouth daily. Take an additional half tablet =87.33mg on Monday and Friday (total 262.548m)    . LORazepam (ATIVAN) 0.5 MG tablet Take 1 tablet (0.5 mg total) by mouth every 6 (six) hours as needed (Nausea or vomiting). 30 tablet 0  . meclizine (ANTIVERT) 50 MG tablet Take 0.5 tablets (25 mg total) by mouth 3 (three) times daily as needed for dizziness or nausea. 21 tablet 0  . meloxicam (MOBIC) 7.5 MG tablet TAKE 1 TABLET DAILY 90 tablet 1  . Multiple Vitamin (MULTIVITAMIN) tablet Take 1 tablet by mouth daily.      . nitroGLYCERIN (NITROSTAT) 0.4 MG SL tablet Place 1 tablet (0.4 mg total) under the tongue every 5 (five) minutes as needed for chest pain. 30 tablet 0  . Omega-3 Fatty Acids (FISH OIL) 1200 MG CAPS Take 1 capsule by mouth daily.      . Marland Kitchenmeprazole (PRILOSEC) 40 MG capsule Take 1 capsule (40 mg total) by mouth daily. 90 capsule 1  . oxybutynin (DITROPAN-XL) 5 MG 24  hr tablet Take 1 tablet (5 mg total) by mouth daily. 90 tablet 1  . oxyCODONE-acetaminophen (ROXICET) 5-325 MG per tablet 1-2 tablets q 4h prn 20 tablet 0  . PROAIR HFA 108 (90 BASE) MCG/ACT inhaler INHALE 2 PUFFS INTO THE LUNGS EVERY 6 HOURS AS NEEDED 25.5 g 1  . rivaroxaban (XARELTO) 20 MG TABS tablet Take 1 tablet (20 mg total) by mouth daily with supper. 30 tablet 2  . simvastatin (ZOCOR) 40 MG tablet TAKE 1 TABLET DAILY 90 tablet 1  . traMADol (ULTRAM) 50 MG tablet Take 1 tablet (50 mg total) by mouth every 6 (six) hours as needed. 30 tablet 0   No current facility-administered medications for this visit.    PHYSICAL EXAMINATION: ECOG PERFORMANCE STATUS: 1 - Symptomatic but completely ambulatory  Filed Vitals:   02/15/15 1343  BP: 144/87  Pulse: 89  Temp: 97.5 F (36.4 C)  Resp: 18   Filed Weights   02/15/15 1343  Weight: 257 lb 6.4 oz (116.756 kg)    GENERAL:alert, no distress and comfortable SKIN: skin color, texture, turgor are normal, no rashes or significant lesions EYES: normal, Conjunctiva are pink and non-injected, sclera clear OROPHARYNX:no exudate, no erythema and  lips, buccal mucosa, and tongue normal  NECK: supple, thyroid normal size, non-tender, without nodularity LYMPH:  no palpable lymphadenopathy in the cervical, axillary or inguinal LUNGS: clear to auscultation and percussion with normal breathing effort HEART: regular rate & rhythm and no murmurs and no lower extremity edema ABDOMEN:abdomen soft, non-tender and normal bowel sounds Musculoskeletal:no cyanosis of digits and no clubbing  NEURO: alert & oriented x 3 with fluent speech, no focal motor/sensory deficits  LABORATORY DATA:  I have reviewed the data as listed   Chemistry      Component Value Date/Time   NA 142 02/15/2015 1327   NA 138 09/16/2014 1140   K 4.3 02/15/2015 1327   K 3.8 09/16/2014 1140   CL 103 09/16/2014 1140   CO2 28 02/15/2015 1327   CO2 27 09/16/2014 1140   BUN 17.0  02/15/2015 1327   BUN 16 09/16/2014 1140   CREATININE 1.0 02/15/2015 1327   CREATININE 0.75 09/16/2014 1140   CREATININE 0.83 05/31/2011 0905      Component Value Date/Time   CALCIUM 10.3 02/15/2015 1327   CALCIUM 8.8 09/16/2014 1140   ALKPHOS 69 02/15/2015 1327   ALKPHOS 52 08/11/2013 0918   AST 18 02/15/2015 1327   AST 19 08/11/2013 0918   ALT 26 02/15/2015 1327   ALT 24 08/11/2013 0918   BILITOT 0.42 02/15/2015 1327   BILITOT 0.4 08/11/2013 0918       Lab Results  Component Value Date   WBC 3.9 02/15/2015   HGB 13.7 02/15/2015   HCT 42.1 02/15/2015   MCV 79.3* 02/15/2015   PLT 228 02/15/2015   NEUTROABS 2.4 02/15/2015   ASSESSMENT & PLAN:  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 M0 stage IIB Adjuvant chemotherapy Completed 4 cycles of dose dense Adriamycin and Cytoxan. 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 Grade 2 neutropenia: Did not require dose reduction, close monitoring of blood counts, ANC 2.4 on 02/15/15 2. Otherwise no major side effects to Ibrance 3. 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  Right hip pain especially after being sedentary : related to anastrozole.  liver function tests:  Normal.  continue the same dosage of  Ibrance.  Survivorship: Patient is now swimming and exercising every day. Is staying active. RTC  December 19 for labs and follow-up   No orders of the defined types were placed in this encounter.   The patient has a good understanding of the overall plan. she agrees with it. she will call with any problems that may develop before the next visit here.   Rulon Eisenmenger, MD

## 2015-03-19 ENCOUNTER — Other Ambulatory Visit: Payer: Self-pay | Admitting: *Deleted

## 2015-03-19 DIAGNOSIS — C50412 Malignant neoplasm of upper-outer quadrant of left female breast: Secondary | ICD-10-CM

## 2015-03-19 MED ORDER — TRAMADOL HCL 50 MG PO TABS: 50.0000 mg | ORAL_TABLET | Freq: Four times a day (QID) | ORAL | Status: DC | PRN

## 2015-04-05 ENCOUNTER — Ambulatory Visit (INDEPENDENT_AMBULATORY_CARE_PROVIDER_SITE_OTHER): Payer: BLUE CROSS/BLUE SHIELD | Admitting: Family

## 2015-04-05 ENCOUNTER — Encounter: Payer: Self-pay | Admitting: Family

## 2015-04-05 VITALS — BP 134/70 | HR 70 | Temp 97.6°F | Resp 18 | Ht 66.25 in | Wt 259.6 lb

## 2015-04-05 DIAGNOSIS — Z23 Encounter for immunization: Secondary | ICD-10-CM

## 2015-04-05 DIAGNOSIS — H60392 Other infective otitis externa, left ear: Secondary | ICD-10-CM | POA: Diagnosis not present

## 2015-04-05 MED ORDER — NEOMYCIN-POLYMYXIN-HC 3.5-10000-1 OT SOLN: 3.0000 [drp] | Freq: Four times a day (QID) | OTIC | Status: DC

## 2015-04-05 NOTE — Progress Notes (Signed)
Pre visit review using our clinic review tool, if applicable. No additional management support is needed unless otherwise documented below in the visit note. 

## 2015-04-05 NOTE — Progress Notes (Signed)
Subjective:    Patient ID: Michelle Kennedy, female    DOB: 1956/05/04, 59 y.o.   MRN: [id number]  HPI  Michelle Kennedy is a 59 yr old female who presents with chief complaint of dizziness.  Pt reports intermittent dizziness x 3-4 days.  She reports bleeding in the left ear since 2 PM today.  Reports tactile temp.  No significant pain.  Some difficulty hearing out of the left ear today.     Review of Systems See HPI  Past Medical History  Diagnosis Date  . GERD (gastroesophageal reflux disease)   . Hyperlipidemia   . History of seizure age 10    as a reaction to Penicillin - no seizures since  . Migraines   . Arthritis     hips and knees  . History of gastric ulcer     as a teenager  . UTI (lower urinary tract infection) 02/17/2014  . Fibromyalgia   . Asthma     daily inhaler, prn inhaler and neb.  Marland Kitchen History of thyroid cancer     s/p thyroidectomy  . Breast cancer ([place]) 01/2014    left  . Family history of anesthesia complication     twin brother aspirated and died on OR table, per pt.  . Abnormally small mouth   . Dental bridge present     upper front and lower right  . Dental crowns present     x 3  . Hypothyroidism     Social History   Social History  . Marital Status: Married    Spouse Name: N/A  . Number of Children: N/A  . 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 Smoking status: Never Smoker   . Smokeless tobacco: Never Used  . Alcohol Use: No  . Drug Use: No  . Sexual Activity: Not on file     Comment: menarche age 26, P 2, first birth age 6, menopause age 66, Premarin x 10 yrs   Other Topics Concern  . Not on file   Social History Narrative   Regular exercise: yes          Past Surgical History  Procedure Laterality Date  . Cholecystectomy  1990  . Breast surgery  2011    left breast biposy  . Appendectomy  2004  . Tonsillectomy and adenoidectomy  2000  . Abdominal hysterectomy  2004   complete  . Thyroidectomy  2000  . Colonoscopy w/ polypectomy  06/2009  . Tumor excision      from thoracic spine  . Incontinence surgery  2004  . Achilles tendon repair Right   . Orif toe fracture Right     great toe  . Knee arthroscopy Bilateral     x 6 each knee  . Ligament repair Right     thumb/wrist  . Eye surgery Right 2013    exc. warts from underneath eyelid  . Breast lumpectomy with needle localization and axillary sentinel lymph node bx Left 02/23/2014    Procedure: BREAST LUMPECTOMY WITH NEEDLE LOCALIZATION AND AXILLARY SENTINEL LYMPH NODE BIOPSY;  Surgeon: Excell Seltzer, MD;  Location: [facility];  Service: General;  Laterality: Left;  . Portacath placement N/A 03/24/2014    Procedure: INSERTION PORT-A-CATH;  Surgeon: Excell Seltzer, MD;  Location: [facility];  Service: General;  Laterality: N/A;    Family History  Problem Relation Age of Onset  .  Alcohol abuse Mother   . Arthritis Mother   . Hypertension Mother   . Bipolar disorder Mother   . Breast cancer Mother 45    unconfirmed  . Lung cancer Mother 59    smoker  . Alcohol abuse Father   . Cancer Father     lung  . Hyperlipidemia Father   . Kidney disease Father   . Diabetes Father   . Arthritis Maternal Grandmother   . Diabetes Paternal Grandmother   . Thyroid cancer Sister 33    type?; currently 15  . Other Sister     ovarian tumor @ 47; TAH/BSO  . Thyroid cancer Brother     dx 44s; currently 81  . Breast cancer Maternal Aunt     dx 88s; deceased 78  . Thyroid cancer Paternal Aunt     All 3 paternal aunts with thyroid ca in 30s/40s  . Lung cancer Paternal Aunt     2 of 3 paternal aunts with lung cancer    Allergies  Allergen Reactions  . Bee Venom Anaphylaxis  . Contrast Media [Iodinated Diagnostic Agents] Shortness Of Breath  . Iodine Other (See Comments)    CARDIAC ARREST  . Latex Anaphylaxis and Rash  . Penicillins Shortness Of Breath, Rash and Other  (See Comments)    SEIZURE  . Shellfish Allergy Shortness Of Breath  . Aspirin Rash and Other (See Comments)    GI UPSET  . Erythromycin Swelling and Rash    SWELLING OF JOINTS  . Lidocaine Swelling    SWELLING OF MOUTH AND THROAT  . Symbicort [Budesonide-Formoterol Fumarate] Other (See Comments)    BURNING OF TONGUE AND LIPS  . Codeine Rash  . Compazine [Prochlorperazine Maleate] Rash    Rash on face,chest, arms, back  . Pentazocine Lactate Rash  . Sulfonamide Derivatives Rash    Current Outpatient Prescriptions on File Prior to Visit  Medication Sig Dispense Refill  . albuterol (PROVENTIL) (5 MG/ML) 0.5% nebulizer solution Take 2.5 mg by nebulization every 6 (six) hours as needed for wheezing or shortness of breath.    . anastrozole (ARIMIDEX) 1 MG tablet Take 1 tablet (1 mg total) by mouth daily. 90 tablet 3  . Calcium Carbonate (CALCIUM 500 PO) Take 1 tablet by mouth daily.     . diphenhydrAMINE (BENADRYL) 25 MG tablet Take 1 tablet an hour before MRI.  Then take 1 tablet at bedtime as needed, per your usual regimen. 30 tablet 0  . DULoxetine (CYMBALTA) 60 MG capsule TAKE 1 CAPSULE DAILY 90 capsule 1  . EPINEPHrine 0.3 mg/0.3 mL IJ SOAJ injection Inject 0.3 mLs (0.3 mg total) into the muscle once. 2 Device 0  . Ergocalciferol (VITAMIN D2) 400 UNITS TABS Take 1 tablet by mouth daily.    . Fluticasone-Salmeterol (ADVAIR DISKUS) 250-50 MCG/DOSE AEPB Inhale 1 puff into the lungs 2 (two) times daily. 180 each 1  . Investigational palbociclib (IBRANCE) 125 MG capsule [facility] AFT-05 [name] Take 1 capsule (125 mg total) by mouth daily. Take with food. Swallow whole. Do not chew. Take on days 1-21. Repeat every 28 days. 23 capsule 0  . levothyroxine (SYNTHROID, LEVOTHROID) 175 MCG tablet Take 175 mcg by mouth daily. Take an additional half tablet =87.20mcg on Monday and Friday (total 262.33mcg)    . meclizine (ANTIVERT) 50 MG tablet Take 0.5 tablets (25 mg total) by mouth 3 (three)  times daily as needed for dizziness or nausea. 21 tablet 0  . meloxicam (MOBIC) 7.5 MG  tablet TAKE 1 TABLET DAILY 90 tablet 1  . Multiple Vitamin (MULTIVITAMIN) tablet Take 1 tablet by mouth daily.      . nitroGLYCERIN (NITROSTAT) 0.4 MG SL tablet Place 1 tablet (0.4 mg total) under the tongue every 5 (five) minutes as needed for chest pain. 30 tablet 0  . Omega-3 Fatty Acids (FISH OIL) 1200 MG CAPS Take 1 capsule by mouth daily.      Marland Kitchen oxybutynin (DITROPAN-XL) 5 MG 24 hr tablet Take 1 tablet (5 mg total) by mouth daily. 90 tablet 1  . oxyCODONE-acetaminophen (ROXICET) 5-325 MG per tablet 1-2 tablets q 4h prn 20 tablet 0  . PROAIR HFA 108 (90 BASE) MCG/ACT inhaler INHALE 2 PUFFS INTO THE LUNGS EVERY 6 HOURS AS NEEDED 25.5 g 1  . rivaroxaban (XARELTO) 20 MG TABS tablet Take 1 tablet (20 mg total) by mouth daily with supper. 30 tablet 2  . simvastatin (ZOCOR) 40 MG tablet TAKE 1 TABLET DAILY 90 tablet 1  . traMADol (ULTRAM) 50 MG tablet Take 1 tablet (50 mg total) by mouth every 6 (six) hours as needed. 30 tablet 0   No current facility-administered medications on file prior to visit.    BP 134/70 mmHg  Pulse 70  Temp(Src) 97.6 F (36.4 C) (Oral)  Resp 18  Ht 5' 6.25" (1.683 m)  Wt 259 lb 9.6 oz (117.754 kg)  BMI 41.57 kg/m2  SpO2 96%       Objective:   Physical Exam  Constitutional: She is oriented to person, place, and time. She appears well-developed and well-nourished.  HENT:  Head: Normocephalic and atraumatic.  Right Ear: Tympanic membrane and ear canal normal.  Left Ear: Tympanic membrane normal.  + erythema in canal as well as some bloody drainage in canal  Cardiovascular: Normal rate, regular rhythm and normal heart sounds.   No murmur heard. Pulmonary/Chest: Effort normal and breath sounds normal. No respiratory distress. She has no wheezes.  Musculoskeletal: She exhibits no edema.  Lymphadenopathy:    She has no cervical adenopathy.  Neurological: She is alert and  oriented to person, place, and time.  Skin: Skin is warm and dry.  Psychiatric: She has a normal mood and affect. Her behavior is normal. Judgment and thought content normal.          Assessment & Plan:  Otitis Externa- left, rx with cortisporin otic.  Advised pt to try adding claritin to see if this helps with her dizziness.

## 2015-04-05 NOTE — Patient Instructions (Addendum)
Start cortisporin drops.   Start claritin 10mg  once daily. Call if symptoms worsen or if symptoms are not improved in 2-3 days.

## 2015-04-07 ENCOUNTER — Telehealth: Payer: Self-pay | Admitting: Medical Oncology

## 2015-04-07 NOTE — Telephone Encounter (Signed)
error 

## 2015-04-13 ENCOUNTER — Other Ambulatory Visit: Payer: Self-pay | Admitting: *Deleted

## 2015-04-13 DIAGNOSIS — C50412 Malignant neoplasm of upper-outer quadrant of left female breast: Secondary | ICD-10-CM

## 2015-04-13 MED ORDER — TRAMADOL HCL 50 MG PO TABS: 50.0000 mg | ORAL_TABLET | Freq: Four times a day (QID) | ORAL | Status: DC | PRN

## 2015-04-13 NOTE — Telephone Encounter (Signed)
Received call from pt stating that she was supposed to have a script of tramadol called to [facility] & they have not received.  Called [facility] & was informed that tramadol not covered by her insurance. She wanted [place] to call script to [facility].  This was done but explained that if it is not covered by insurance, they won't be able to fill either.  She will check with [facility].

## 2015-05-04 ENCOUNTER — Other Ambulatory Visit: Payer: Self-pay | Admitting: Medical Oncology

## 2015-05-04 DIAGNOSIS — C50412 Malignant neoplasm of upper-outer quadrant of left female breast: Secondary | ICD-10-CM

## 2015-05-09 NOTE — Assessment & Plan Note (Signed)
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 M0 stage IIB Adjuvant chemotherapy Completed 4 cycles of dose dense Adriamycin and Cytoxan. 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 Grade 2 neutropenia: Did not require dose reduction, close monitoring of blood counts, ANC 2.4 on 02/15/15 2. Otherwise no major side effects to Ibrance 3. 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 Right hip pain especially after being sedentary : related to anastrozole. liver function tests: Normal. continue the same dosage of Ibrance.  Survivorship: Patient is now swimming and exercising every day. Is staying active. RTC December 19 for labs and follow-up

## 2015-05-10 ENCOUNTER — Ambulatory Visit (HOSPITAL_BASED_OUTPATIENT_CLINIC_OR_DEPARTMENT_OTHER): Payer: BLUE CROSS/BLUE SHIELD | Admitting: Hematology and Oncology

## 2015-05-10 ENCOUNTER — Other Ambulatory Visit (HOSPITAL_BASED_OUTPATIENT_CLINIC_OR_DEPARTMENT_OTHER): Payer: BLUE CROSS/BLUE SHIELD

## 2015-05-10 ENCOUNTER — Encounter: Payer: Self-pay | Admitting: Medical Oncology

## 2015-05-10 ENCOUNTER — Telehealth: Payer: Self-pay | Admitting: Hematology and Oncology

## 2015-05-10 ENCOUNTER — Encounter: Payer: Self-pay | Admitting: Hematology and Oncology

## 2015-05-10 VITALS — BP 150/97 | HR 72 | Temp 98.2°F | Resp 19 | Ht 66.25 in | Wt 260.1 lb

## 2015-05-10 DIAGNOSIS — G620 Drug-induced polyneuropathy: Secondary | ICD-10-CM | POA: Insufficient documentation

## 2015-05-10 DIAGNOSIS — Z006 Encounter for examination for normal comparison and control in clinical research program: Secondary | ICD-10-CM

## 2015-05-10 DIAGNOSIS — Z79811 Long term (current) use of aromatase inhibitors: Secondary | ICD-10-CM

## 2015-05-10 DIAGNOSIS — Z170 Estrogen receptor positive status [ER+]: Secondary | ICD-10-CM | POA: Diagnosis not present

## 2015-05-10 DIAGNOSIS — C50412 Malignant neoplasm of upper-outer quadrant of left female breast: Secondary | ICD-10-CM

## 2015-05-10 DIAGNOSIS — N951 Menopausal and female climacteric states: Secondary | ICD-10-CM

## 2015-05-10 DIAGNOSIS — D701 Agranulocytosis secondary to cancer chemotherapy: Secondary | ICD-10-CM

## 2015-05-10 DIAGNOSIS — I82492 Acute embolism and thrombosis of other specified deep vein of left lower extremity: Secondary | ICD-10-CM

## 2015-05-10 DIAGNOSIS — T451X5A Adverse effect of antineoplastic and immunosuppressive drugs, initial encounter: Secondary | ICD-10-CM

## 2015-05-10 LAB — CBC WITH DIFFERENTIAL/PLATELET
BASO%: 1 % (ref 0.0–2.0)
Basophils Absolute: 0 10*3/uL (ref 0.0–0.1)
EOS%: 2.9 % (ref 0.0–7.0)
Eosinophils Absolute: 0.1 10*3/uL (ref 0.0–0.5)
HCT: 40.3 % (ref 34.8–46.6)
HGB: 12.8 g/dL (ref 11.6–15.9)
LYMPH%: 29.3 % (ref 14.0–49.7)
MCH: 26.9 pg (ref 25.1–34.0)
MCHC: 31.7 g/dL (ref 31.5–36.0)
MCV: 84.8 fL (ref 79.5–101.0)
MONO#: 0.2 10*3/uL (ref 0.1–0.9)
MONO%: 8.9 % (ref 0.0–14.0)
NEUT%: 57.9 % (ref 38.4–76.8)
NEUTROS ABS: 1.5 10*3/uL (ref 1.5–6.5)
Platelets: 222 10*3/uL (ref 145–400)
RBC: 4.75 10*6/uL (ref 3.70–5.45)
RDW: 16 % — ABNORMAL HIGH (ref 11.2–14.5)
WBC: 2.6 10*3/uL — AB (ref 3.9–10.3)
lymph#: 0.8 10*3/uL — ABNORMAL LOW (ref 0.9–3.3)

## 2015-05-10 LAB — COMPREHENSIVE METABOLIC PANEL
ALT: 26 U/L (ref 0–55)
AST: 21 U/L (ref 5–34)
Albumin: 3.7 g/dL (ref 3.5–5.0)
Alkaline Phosphatase: 68 U/L (ref 40–150)
Anion Gap: 8 mEq/L (ref 3–11)
BUN: 11.8 mg/dL (ref 7.0–26.0)
CHLORIDE: 106 meq/L (ref 98–109)
CO2: 26 meq/L (ref 22–29)
Calcium: 9.5 mg/dL (ref 8.4–10.4)
Creatinine: 0.9 mg/dL (ref 0.6–1.1)
EGFR: 68 mL/min/{1.73_m2} — AB (ref >90)
Glucose: 101 mg/dl (ref 70–140)
Potassium: 4.3 mEq/L (ref 3.5–5.1)
SODIUM: 140 meq/L (ref 136–145)
TOTAL PROTEIN: 6.4 g/dL (ref 6.4–8.3)
Total Bilirubin: 0.43 mg/dL (ref 0.20–1.20)

## 2015-05-10 LAB — HEMOGLOBIN A1C
HEMOGLOBIN A1C: 5.7 % — AB (ref <5.7)
Mean Plasma Glucose: 117 mg/dL — ABNORMAL HIGH (ref <117)

## 2015-05-10 LAB — RESEARCH LABS

## 2015-05-10 MED ORDER — INV-PALBOCICLIB 125 MG CAPS #23 ALLIANCE FOUNDATION AFT-05 (PALLAS): 125.0000 mg | ORAL_CAPSULE | Freq: Every day | ORAL | Status: DC

## 2015-05-10 NOTE — Progress Notes (Signed)
[name]: visit 6 (Cycle 6, Cycle 7, Cycle 8): Treatment Arm A, Palbociclib + Endocrine therapy Patient was provided with and completed study required PRO's, prior to her lab and MD appointment's this morning, after checking into clinic.  I met with patient after her lab appointment in exam room. Patient here today for the start of Cycle 6. Resting vitals completed: B/P 150/97, HR=72, Temp=98.2, R=19, O2 sat @ 98%, maintaining weight @ 117.9 kg. Reivew of patient's medications and updated with study as required. I collected patient's Drug Diaries from Cycle 3, Cycle 4 and Cycle 5 and collected study dispensed Palbociclib bottles from each cycle with no (0)  pills returned. Patient recorded in Drug Diaries and reports no missed doses during the three cycles. Patient denies any concerns or difficulties in taking either Palbociclib or Arimidix. Patient continues to report right hip pain, rating it a 3 on 10 point scale and continues to state that it worsens with activity and when sedentary for extended periods. Patient denies bowel or bladder problems, denies fevers, denies nausea or vomiting, denies respiratory issues, denies appetite problems, denies abdominal pain and reports no headaches. Patient continues to report non worsening neuropathy to tips of fingers and toes which does not interfere with her daily activities. Patient reports to have had a "couple episodes of vertigo" which has since resolved. Patient reports to have also had an "ear canal lining infection" in which she saw her PCP and was prescribed cortisporin ear drops on November 14, and has since cleared up x 2 weeks now. Per Dr. Geralyn Flash physical assessment of patient and review of today's labs and NCS labs, patient to continue with Cycle 6, Cycle 7 and Cycle 8, per protocol, and to continue at 125 mg Palbociclib dosage. I provided patient with Drug Diaries for Palbociclib and Anti Hormone therapy for Cycle 6, Cycle 7 and Cycle 8 with stop and start  dates and re-educated patient on self administration. Three new individual bottles for Cycle 6, 7 and 8 of Palbociclib containing 21 capsules in each bottle were dispensed to patient after obtaining them from pharmacist Meredith Leeds. Per protocol, patient will be scheduled to return in three months for Labs and MD visit and start of Cycle 9, approximately August 02, 2015. All patient's questions answered to her satisfaction, patient denies further questions at this time. I encouraged patient to call clinic, Dr. Lindi Adie or myself with any questions or concerns she may have. Patient thanked for her time and continued support of study. Adele Dan, RN, Clinical Research 05/10/2015 11:39 AM

## 2015-05-10 NOTE — Progress Notes (Signed)
Patient Care Team: Debbrah Alar, NP as PCP - General (Internal Medicine) Excell Seltzer, MD as Consulting Physician (General Surgery) Nicholas Lose, MD as Consulting Physician (Hematology and Oncology) [name], MD as Attending Physician (Radiation Oncology) Katheran James., MD (Endocrinology)  DIAGNOSIS: Breast cancer of upper-outer quadrant of left female breast [facility])   Staging form: Breast, AJCC 7th Edition     Clinical: Stage IIB (T3, N0, cM0) - Unsigned       Staging comments: Staged at breast conference 12/31/13.      Pathologic: Stage IIB (T2, N1a, cM0) - Signed by Rulon Eisenmenger, MD on 03/10/2014   SUMMARY OF ONCOLOGIC HISTORY:   Breast cancer of upper-outer quadrant of left female breast ([place])   12/22/2013 Mammogram Left breast upper-outer quadrant 1.8 x 1.4 x 1.6 cm mass with left axillary lymph node measuring 3.6 mm   12/30/2013 Breast MRI Dominant enhancing left upper outer quadrant irregular mass encompassing a confluent area of abnormal clumped nodular enhancement, 5.5 cm overall, 6 mm masslike enhancement central left breast     02/23/2014 Surgery Left lumpectomy: Invasive ductal carcinoma grade 2 spending 3.8 cm intermediate grade DCIS with lymphovascular invasion margins negative one out of 4 SLN positive, ER 99%, PR 100%, HER-2 negative ratio 0.74, T2, N1, M0 stage IIB   03/19/2014 PET scan No evidence of distant metastatic disease   03/31/2014 - 08/18/2014 Chemotherapy Adjuvant chemotherapy with dose dense Adriamycin Cytoxan x4 followed by Abraxane weekly x12   08/28/2014 Imaging Left brachial vein thrombus   09/11/2014 -  Radiation Therapy Adjuvant radiation therapy   11/20/2014 -  Anti-estrogen oral therapy Anastrozole 1 mg daily + Ibrance ([name] clinical trial)    CHIEF COMPLIANT: Cycle 6 Ibrance with anastrozole on clinical trial  INTERVAL HISTORY: Michelle Kennedy is a 59 year old with above measures or left breast cancer was currently on [name] clinical  trial and is on Ibrance with anastrozole. She appears to be tolerating it extremely well without any nausea or vomiting. She does not have any GI symptoms. She had an episode of otitis externa that was treated with antibiotics. She had 2 episodes of vertigo which resolved spontaneously. Neuropathy appears to be under good control. The hip pain is also stable.   REVIEW OF SYSTEMS:   Constitutional: Denies fevers, chills or abnormal weight loss Eyes: Denies blurriness of vision Ears, nose, mouth, throat, and face: Denies mucositis or sore throat Respiratory: Denies cough, dyspnea or wheezes Cardiovascular: Denies palpitation, chest discomfort or lower extremity swelling Gastrointestinal:  Denies nausea, heartburn or change in bowel habits Skin: Denies abnormal skin rashes Lymphatics: Denies new lymphadenopathy or easy bruising Neurological:Denies numbness, tingling or new weaknesses Behavioral/Psych: Mood is stable, no new changes  Breast:  denies any pain or lumps or nodules in either breasts All other systems were reviewed with the patient and are negative.  I have reviewed the past medical history, past surgical history, social history and family history with the patient and they are unchanged from previous note.  ALLERGIES:  is allergic to bee venom; contrast media; iodine; latex; penicillins; shellfish allergy; aspirin; erythromycin; lidocaine; symbicort; codeine; compazine; pentazocine lactate; and sulfonamide derivatives.  MEDICATIONS:  Current Outpatient Prescriptions  Medication Sig Dispense Refill  . albuterol (PROVENTIL) (5 MG/ML) 0.5% nebulizer solution Take 2.5 mg by nebulization every 6 (six) hours as needed for wheezing or shortness of breath.    . anastrozole (ARIMIDEX) 1 MG tablet Take 1 tablet (1 mg total) by mouth daily. 90 tablet  3  . Calcium Carbonate (CALCIUM 500 PO) Take 1 tablet by mouth daily.     . diphenhydrAMINE (BENADRYL) 25 MG tablet Take 1 tablet an hour before  MRI.  Then take 1 tablet at bedtime as needed, per your usual regimen. 30 tablet 0  . DULoxetine (CYMBALTA) 60 MG capsule TAKE 1 CAPSULE DAILY 90 capsule 1  . EPINEPHrine 0.3 mg/0.3 mL IJ SOAJ injection Inject 0.3 mLs (0.3 mg total) into the muscle once. 2 Device 0  . Ergocalciferol (VITAMIN D2) 400 UNITS TABS Take 1 tablet by mouth daily.    . Fluticasone-Salmeterol (ADVAIR DISKUS) 250-50 MCG/DOSE AEPB Inhale 1 puff into the lungs 2 (two) times daily. 180 each 1  . Investigational palbociclib (IBRANCE) 125 MG capsule [employer] [facility] AFT-05 [name] Take 1 capsule (125 mg total) by mouth daily. Take with food. Swallow whole. Do not chew. Take on days 1-21. Repeat every 28 days. 23 capsule 0  . levothyroxine (SYNTHROID, LEVOTHROID) 175 MCG tablet Take 175 mcg by mouth daily. Take an additional half tablet =87.39mg on Monday and Friday (total 262.558m)    . meclizine (ANTIVERT) 50 MG tablet Take 0.5 tablets (25 mg total) by mouth 3 (three) times daily as needed for dizziness or nausea. 21 tablet 0  . meloxicam (MOBIC) 7.5 MG tablet TAKE 1 TABLET DAILY 90 tablet 1  . Multiple Vitamin (MULTIVITAMIN) tablet Take 1 tablet by mouth daily.      . Marland Kitcheneomycin-polymyxin-hydrocortisone (CORTISPORIN) otic solution Place 3 drops into the left ear 4 (four) times daily. 10 mL 0  . nitroGLYCERIN (NITROSTAT) 0.4 MG SL tablet Place 1 tablet (0.4 mg total) under the tongue every 5 (five) minutes as needed for chest pain. 30 tablet 0  . Omega-3 Fatty Acids (FISH OIL) 1200 MG CAPS Take 1 capsule by mouth daily.      . Marland Kitchenxybutynin (DITROPAN-XL) 5 MG 24 hr tablet Take 1 tablet (5 mg total) by mouth daily. 90 tablet 1  . oxyCODONE-acetaminophen (ROXICET) 5-325 MG per tablet 1-2 tablets q 4h prn 20 tablet 0  . PROAIR HFA 108 (90 BASE) MCG/ACT inhaler INHALE 2 PUFFS INTO THE LUNGS EVERY 6 HOURS AS NEEDED 25.5 g 1  . rivaroxaban (XARELTO) 20 MG TABS tablet Take 1 tablet (20 mg total) by mouth daily with supper. 30 tablet 2  .  simvastatin (ZOCOR) 40 MG tablet TAKE 1 TABLET DAILY 90 tablet 1  . traMADol (ULTRAM) 50 MG tablet Take 1 tablet (50 mg total) by mouth every 6 (six) hours as needed. 30 tablet 0   No current facility-administered medications for this visit.    PHYSICAL EXAMINATION: ECOG PERFORMANCE STATUS: 1 - Symptomatic but completely ambulatory  Filed Vitals:   05/10/15 0939  BP: 150/97  Pulse: 72  Temp: 98.2 F (36.8 C)  Resp: 19   Filed Weights   05/10/15 0939  Weight: 260 lb 1.6 oz (117.981 kg)    GENERAL:alert, no distress and comfortable SKIN: skin color, texture, turgor are normal, no rashes or significant lesions EYES: normal, Conjunctiva are pink and non-injected, sclera clear OROPHARYNX:no exudate, no erythema and lips, buccal mucosa, and tongue normal  NECK: supple, thyroid normal size, non-tender, without nodularity LYMPH:  no palpable lymphadenopathy in the cervical, axillary or inguinal LUNGS: clear to auscultation and percussion with normal breathing effort HEART: regular rate & rhythm and no murmurs and no lower extremity edema ABDOMEN:abdomen soft, non-tender and normal bowel sounds Musculoskeletal:no cyanosis of digits and no clubbing  NEURO: alert & oriented  x 3 with fluent speech, no focal motor/sensory deficits BREAST: Left breast lumpectomy site there is a palpable nodule which is related to scar tissue from radiation and surgery . No palpable axillary supraclavicular or infraclavicular adenopathy no breast tenderness or nipple discharge. (exam performed in the presence of a chaperone)  LABORATORY DATA:  I have reviewed the data as listed   Chemistry      Component Value Date/Time   NA 140 05/10/2015 0916   NA 138 09/16/2014 1140   K 4.3 05/10/2015 0916   K 3.8 09/16/2014 1140   CL 103 09/16/2014 1140   CO2 26 05/10/2015 0916   CO2 27 09/16/2014 1140   BUN 11.8 05/10/2015 0916   BUN 16 09/16/2014 1140   CREATININE 0.9 05/10/2015 0916   CREATININE 0.75  09/16/2014 1140   CREATININE 0.83 05/31/2011 0905      Component Value Date/Time   CALCIUM 9.5 05/10/2015 0916   CALCIUM 8.8 09/16/2014 1140   ALKPHOS 68 05/10/2015 0916   ALKPHOS 52 08/11/2013 0918   AST 21 05/10/2015 0916   AST 19 08/11/2013 0918   ALT 26 05/10/2015 0916   ALT 24 08/11/2013 0918   BILITOT 0.43 05/10/2015 0916   BILITOT 0.4 08/11/2013 0918       Lab Results  Component Value Date   WBC 2.6* 05/10/2015   HGB 12.8 05/10/2015   HCT 40.3 05/10/2015   MCV 84.8 05/10/2015   PLT 222 05/10/2015   NEUTROABS 1.5 05/10/2015   ASSESSMENT & PLAN:  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 M0 stage IIB Adjuvant chemotherapy Completed 4 cycles of dose dense Adriamycin and Cytoxan. 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 Grade 2 neutropenia: Did not require dose reduction, close monitoring of blood counts, ANC1.5 on 05/10/15 2. Otherwise no major side effects to Ibrance 3. 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 Right hip pain especially after being sedentary : related to anastrozole. 5. Recent otitis externa December 2016: Treated with antibiotics by her PCP liver function tests: Normal. continue the same dosage of  Ibrance.  Survivorship: Patient is now swimming and exercising every day. Is staying active. Breast Cancer Surveillance: 1. Breast exam 05/10/2015: Normal except for scar tissue related to prior surgery and radiation 2. Mammogram will need to be scheduled for January 2017. Patient plans to go to [place] for the holidays   RTC 08/02/15 for labs and follow-up    No orders of the defined types were placed in this encounter.   The patient has a good understanding of the overall plan. she agrees with it. she will call with any problems that may develop before the next visit here.   Rulon Eisenmenger, MD 05/10/2015

## 2015-05-10 NOTE — Telephone Encounter (Signed)
Called and left a message with follow up appointment and for her to call the breast center for her annual follow up as they are booking next year at this point

## 2015-06-14 ENCOUNTER — Ambulatory Visit (HOSPITAL_BASED_OUTPATIENT_CLINIC_OR_DEPARTMENT_OTHER)
Admission: RE | Admit: 2015-06-14 | Discharge: 2015-06-14 | Disposition: A | Discharge status: Home / Self Care | Payer: 59 | Source: Ambulatory Visit | Attending: Physician Assistant | Admitting: Physician Assistant

## 2015-06-14 ENCOUNTER — Encounter: Payer: Self-pay | Admitting: Physician Assistant

## 2015-06-14 ENCOUNTER — Ambulatory Visit (INDEPENDENT_AMBULATORY_CARE_PROVIDER_SITE_OTHER): Payer: 59 | Admitting: Physician Assistant

## 2015-06-14 ENCOUNTER — Telehealth: Payer: Self-pay | Admitting: *Deleted

## 2015-06-14 VITALS — BP 146/87 | HR 89 | Temp 98.7°F | Resp 16 | Ht 66.25 in | Wt 262.2 lb

## 2015-06-14 DIAGNOSIS — S6991XA Unspecified injury of right wrist, hand and finger(s), initial encounter: Secondary | ICD-10-CM

## 2015-06-14 DIAGNOSIS — W19XXXA Unspecified fall, initial encounter: Secondary | ICD-10-CM | POA: Diagnosis not present

## 2015-06-14 DIAGNOSIS — C50412 Malignant neoplasm of upper-outer quadrant of left female breast: Secondary | ICD-10-CM | POA: Diagnosis not present

## 2015-06-14 DIAGNOSIS — M79641 Pain in right hand: Secondary | ICD-10-CM | POA: Insufficient documentation

## 2015-06-14 DIAGNOSIS — S6990XA Unspecified injury of unspecified wrist, hand and finger(s), initial encounter: Secondary | ICD-10-CM | POA: Insufficient documentation

## 2015-06-14 MED ORDER — TRAMADOL HCL 50 MG PO TABS: 50.0000 mg | ORAL_TABLET | Freq: Two times a day (BID) | ORAL | Status: DC | PRN

## 2015-06-14 NOTE — Assessment & Plan Note (Signed)
Concern for Skier's thumb. Patient to continue thumb spica splint. Tramadol refilled to take as directed. ICE and Aspercreme use discussed. Will obtain x-ray today. Will refer to Ortho or Sports Med based on x-ray results.

## 2015-06-14 NOTE — Progress Notes (Signed)
Patient presents to clinic today c/o 2 separate injuries to the thumb occurring over the past 3 weeks where the thumb has been forcefully abducted. Endorses pain that has gradually worsened. Endorses some mild decrease in strength and ROM of thumb. Endorses some tingling in the area.  Past Medical History  Diagnosis Date  . GERD (gastroesophageal reflux disease)   . Hyperlipidemia   . History of seizure age 60    as a reaction to Penicillin - no seizures since  . Migraines   . Arthritis     hips and knees  . History of gastric ulcer     as a teenager  . UTI (lower urinary tract infection) 02/17/2014  . Fibromyalgia   . Asthma     daily inhaler, prn inhaler and neb.  Marland Kitchen History of thyroid cancer     s/p thyroidectomy  . Breast cancer ([place]) 01/2014    left  . Family history of anesthesia complication     twin brother aspirated and died on OR table, per pt.  . Abnormally small mouth   . Dental bridge present     upper front and lower right  . Dental crowns present     x 3  . Hypothyroidism     Current Outpatient Prescriptions on File Prior to Visit  Medication Sig Dispense Refill  . albuterol (PROVENTIL) (5 MG/ML) 0.5% nebulizer solution Take 2.5 mg by nebulization every 6 (six) hours as needed for wheezing or shortness of breath.    . anastrozole (ARIMIDEX) 1 MG tablet Take 1 tablet (1 mg total) by mouth daily. 90 tablet 3  . Calcium Carbonate (CALCIUM 500 PO) Take 1 tablet by mouth daily.     . diphenhydrAMINE (BENADRYL) 25 MG tablet Take 1 tablet an hour before MRI.  Then take 1 tablet at bedtime as needed, per your usual regimen. 30 tablet 0  . DULoxetine (CYMBALTA) 60 MG capsule TAKE 1 CAPSULE DAILY 90 capsule 1  . EPINEPHrine 0.3 mg/0.3 mL IJ SOAJ injection Inject 0.3 mLs (0.3 mg total) into the muscle once. 2 Device 0  . Ergocalciferol (VITAMIN D2) 400 UNITS TABS Take 1 tablet by mouth daily.    . Fluticasone-Salmeterol (ADVAIR DISKUS) 250-50 MCG/DOSE AEPB Inhale 1 puff  into the lungs 2 (two) times daily. 180 each 1  . Investigational palbociclib (IBRANCE) 125 MG capsule [employer] [facility] AFT-05 [name] Take 1 capsule (125 mg total) by mouth daily. Take with food. Swallow whole. Do not chew. Take on days 1-21. Repeat every 28 days. 69 capsule 0  . levothyroxine (SYNTHROID, LEVOTHROID) 175 MCG tablet Take 175 mcg by mouth daily. Take an additional half tablet =87.54mg on Monday and Friday (total 262.519m)    . meclizine (ANTIVERT) 50 MG tablet Take 0.5 tablets (25 mg total) by mouth 3 (three) times daily as needed for dizziness or nausea. 21 tablet 0  . meloxicam (MOBIC) 7.5 MG tablet TAKE 1 TABLET DAILY 90 tablet 1  . Multiple Vitamin (MULTIVITAMIN) tablet Take 1 tablet by mouth daily.      . Marland Kitcheneomycin-polymyxin-hydrocortisone (CORTISPORIN) otic solution Place 3 drops into the left ear 4 (four) times daily. 10 mL 0  . nitroGLYCERIN (NITROSTAT) 0.4 MG SL tablet Place 1 tablet (0.4 mg total) under the tongue every 5 (five) minutes as needed for chest pain. 30 tablet 0  . Omega-3 Fatty Acids (FISH OIL) 1200 MG CAPS Take 1 capsule by mouth daily.      . Marland Kitchenxybutynin (DITROPAN-XL) 5 MG  24 hr tablet Take 1 tablet (5 mg total) by mouth daily. 90 tablet 1  . oxyCODONE-acetaminophen (ROXICET) 5-325 MG per tablet 1-2 tablets q 4h prn 20 tablet 0  . PROAIR HFA 108 (90 BASE) MCG/ACT inhaler INHALE 2 PUFFS INTO THE LUNGS EVERY 6 HOURS AS NEEDED 25.5 g 1  . simvastatin (ZOCOR) 40 MG tablet TAKE 1 TABLET DAILY 90 tablet 1   No current facility-administered medications on file prior to visit.    Allergies  Allergen Reactions  . Bee Venom Anaphylaxis  . Contrast Media [Iodinated Diagnostic Agents] Shortness Of Breath  . Iodine Other (See Comments)    CARDIAC ARREST  . Latex Anaphylaxis and Rash  . Penicillins Shortness Of Breath, Rash and Other (See Comments)    SEIZURE  . Shellfish Allergy Shortness Of Breath  . Aspirin Rash and Other (See Comments)    GI UPSET  .  Erythromycin Swelling and Rash    SWELLING OF JOINTS  . Lidocaine Swelling    SWELLING OF MOUTH AND THROAT  . Symbicort [Budesonide-Formoterol Fumarate] Other (See Comments)    BURNING OF TONGUE AND LIPS  . Codeine Rash  . Compazine [Prochlorperazine Maleate] Rash    Rash on face,chest, arms, back  . Pentazocine Lactate Rash  . Sulfonamide Derivatives Rash    Family History  Problem Relation Age of Onset  . Alcohol abuse Mother   . Arthritis Mother   . Hypertension Mother   . Bipolar disorder Mother   . Breast cancer Mother 71    unconfirmed  . Lung cancer Mother 41    smoker  . Alcohol abuse Father   . Cancer Father     lung  . Hyperlipidemia Father   . Kidney disease Father   . Diabetes Father   . Arthritis Maternal Grandmother   . Diabetes Paternal Grandmother   . Thyroid cancer Sister 69    type?; currently 31  . Other Sister     ovarian tumor @ 58; TAH/BSO  . Thyroid cancer Brother     dx 41s; currently 23  . Breast cancer Maternal Aunt     dx 50s; deceased 25  . Thyroid cancer Paternal Aunt     All 3 paternal aunts with thyroid ca in 30s/40s  . Lung cancer Paternal Aunt     2 of 3 paternal aunts with lung cancer    Social History   Social History  . Marital Status: Married    Spouse Name: N/A  . Number of Children: N/A  . 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 Smoking status: Never Smoker   . Smokeless tobacco: Never Used  . Alcohol Use: No  . Drug Use: No  . Sexual Activity: Not Asked     Comment: menarche age 39, P 2, first birth age 57, menopause age 64, Premarin x 10 yrs   Other Topics Concern  . None   Social History Narrative   Regular exercise: yes          Review of Systems - See HPI.  All other ROS are negative.  BP 146/87 mmHg  Pulse 89  Temp(Src) 98.7 F (37.1 C) (Oral)  Resp 16  Ht 5' 6.25" (1.683 m)  Wt 262 lb 3.2 oz (118.933 kg)  BMI 41.99 kg/m2  SpO2  98%  Physical Exam  Constitutional: She is well-developed, well-nourished, and in no distress.  HENT:  Head: Normocephalic and  atraumatic.  Eyes: Conjunctivae are normal.  Neck: Neck supple.  Cardiovascular: Normal rate, regular rhythm, normal heart sounds and intact distal pulses.   Pulmonary/Chest: Effort normal and breath sounds normal.  Musculoskeletal:       Hands: Vitals reviewed.   Recent Results (from the past 2160 hour(s))  CBC with Differential/Platelet     Status: Abnormal   Collection Time: 05/10/15  9:16 AM  Result Value Ref Range   WBC 2.6 (L) 3.9 - 10.3 10e3/uL   NEUT# 1.5 1.5 - 6.5 10e3/uL   HGB 12.8 11.6 - 15.9 g/dL   HCT 40.3 34.8 - 46.6 %   Platelets 222 145 - 400 10e3/uL   MCV 84.8 79.5 - 101.0 fL   MCH 26.9 25.1 - 34.0 pg   MCHC 31.7 31.5 - 36.0 g/dL   RBC 4.75 3.70 - 5.45 10e6/uL   RDW 16.0 (H) 11.2 - 14.5 %   lymph# 0.8 (L) 0.9 - 3.3 10e3/uL   MONO# 0.2 0.1 - 0.9 10e3/uL   Eosinophils Absolute 0.1 0.0 - 0.5 10e3/uL   Basophils Absolute 0.0 0.0 - 0.1 10e3/uL   NEUT% 57.9 38.4 - 76.8 %   LYMPH% 29.3 14.0 - 49.7 %   MONO% 8.9 0.0 - 14.0 %   EOS% 2.9 0.0 - 7.0 %   BASO% 1.0 0.0 - 2.0 %  RESEARCH LABS     Status: None   Collection Time: 05/10/15  9:16 AM  Result Value Ref Range   Research Labs Collected.   Comprehensive metabolic panel     Status: Abnormal   Collection Time: 05/10/15  9:16 AM  Result Value Ref Range   Sodium 140 136 - 145 mEq/L   Potassium 4.3 3.5 - 5.1 mEq/L   Chloride 106 98 - 109 mEq/L   CO2 26 22 - 29 mEq/L   Glucose 101 70 - 140 mg/dl    Comment: Glucose reference range is for nonfasting patients. Fasting glucose reference range is 70- 100.   BUN 11.8 7.0 - 26.0 mg/dL   Creatinine 0.9 0.6 - 1.1 mg/dL   Total Bilirubin 0.43 0.20 - 1.20 mg/dL   Alkaline Phosphatase 68 40 - 150 U/L   AST 21 5 - 34 U/L   ALT 26 0 - 55 U/L   Total Protein 6.4 6.4 - 8.3 g/dL   Albumin 3.7 3.5 - 5.0 g/dL   Calcium 9.5 8.4 - 10.4 mg/dL    Anion Gap 8 3 - 11 mEq/L   EGFR 68 (L) >90 ml/min/1.73 m2    Comment: eGFR is calculated using the CKD-EPI Creatinine Equation (2009)  Hemoglobin A1c     Status: Abnormal   Collection Time: 05/10/15  9:16 AM  Result Value Ref Range   Hgb A1c MFr Bld 5.7 (H) <5.7 %    Comment:                                                                        According to the ADA Clinical Practice Recommendations for 2011, when HbA1c is used as a screening test:     >=6.5%   Diagnostic of Diabetes Mellitus            (if abnormal result is confirmed)  5.7-6.4%   Increased risk of developing Diabetes Mellitus   References:Diagnosis and Classification of Diabetes Mellitus,Diabetes CKFW,5910,28(DKSMM 1):S62-S69 and Standards of Medical Care in         Diabetes - 2011,Diabetes OCAR,8614,83 (Suppl 1):S11-S61.      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 Patient to continue thumb spica splint. Tramadol refilled to take as directed. ICE and Aspercreme use discussed. Will obtain x-ray today. Will refer to Ortho or Sports Med based on x-ray results.

## 2015-06-14 NOTE — Patient Instructions (Signed)
Please go downstairs for imaging. Continue the thumb spica splint. Apply topical Aspercreme or Salon Pas to the area.  Keep elevated and Ice.  We will be referring to Sports Medicine if x-ray negative.

## 2015-06-14 NOTE — Telephone Encounter (Signed)
Pt in office today requesting completion for recertification of parking placard. Form placed in PCP's red folder for signature.  Also requests to pick up written Rxs on new meds as she may be getting a new mail order service. Rxs pended.

## 2015-06-14 NOTE — Progress Notes (Signed)
Pre visit review using our clinic review tool, if applicable. No additional management support is needed unless otherwise documented below in the visit note. 

## 2015-06-15 ENCOUNTER — Telehealth: Payer: Self-pay

## 2015-06-15 DIAGNOSIS — M25539 Pain in unspecified wrist: Secondary | ICD-10-CM

## 2015-06-15 MED ORDER — DULOXETINE HCL 60 MG PO CPEP: 60.0000 mg | ORAL_CAPSULE | Freq: Every day | ORAL | Status: DC

## 2015-06-15 MED ORDER — LEVOTHYROXINE SODIUM 175 MCG PO TABS: 175.0000 ug | ORAL_TABLET | Freq: Every day | ORAL | Status: DC

## 2015-06-15 MED ORDER — FLUTICASONE-SALMETEROL 250-50 MCG/DOSE IN AEPB: 1.0000 | INHALATION_SPRAY | Freq: Two times a day (BID) | RESPIRATORY_TRACT | Status: DC

## 2015-06-15 MED ORDER — SIMVASTATIN 40 MG PO TABS: 40.0000 mg | ORAL_TABLET | Freq: Every day | ORAL | Status: DC

## 2015-06-15 MED ORDER — ALBUTEROL SULFATE HFA 108 (90 BASE) MCG/ACT IN AERS: INHALATION_SPRAY | RESPIRATORY_TRACT | Status: DC

## 2015-06-15 NOTE — Telephone Encounter (Signed)
Left detailed message on spouse's voicemail that Rxs and parking placard are at the front desk for pick up and to call if any questions. Copy of parking certificate sent to scanning.

## 2015-06-15 NOTE — Telephone Encounter (Signed)
Spoke to pt and gave xray results.  Pt agreed to be seen by sports medicine (Dr. Valeria Batman office) Order pended for [place] to review.

## 2015-06-15 NOTE — Telephone Encounter (Signed)
When paperwork is ready please call her husband [name] [phone] and he will pick up

## 2015-06-15 NOTE — Telephone Encounter (Signed)
Completed.

## 2015-06-15 NOTE — Telephone Encounter (Signed)
REferral placed.

## 2015-06-17 ENCOUNTER — Ambulatory Visit
Admission: RE | Admit: 2015-06-17 | Discharge: 2015-06-17 | Disposition: A | Discharge status: Home / Self Care | Payer: 59 | Source: Ambulatory Visit | Attending: Hematology and Oncology | Admitting: Hematology and Oncology

## 2015-06-17 ENCOUNTER — Ambulatory Visit: Payer: 59 | Admitting: Family Medicine

## 2015-06-17 DIAGNOSIS — C50412 Malignant neoplasm of upper-outer quadrant of left female breast: Secondary | ICD-10-CM

## 2015-06-18 ENCOUNTER — Encounter: Payer: Self-pay | Admitting: Family Medicine

## 2015-06-18 ENCOUNTER — Ambulatory Visit (INDEPENDENT_AMBULATORY_CARE_PROVIDER_SITE_OTHER): Payer: 59 | Admitting: Family Medicine

## 2015-06-18 VITALS — BP 147/92 | HR 71 | Ht 67.0 in | Wt 258.0 lb

## 2015-06-18 DIAGNOSIS — M25532 Pain in left wrist: Secondary | ICD-10-CM

## 2015-06-18 DIAGNOSIS — S6991XA Unspecified injury of right wrist, hand and finger(s), initial encounter: Secondary | ICD-10-CM

## 2015-06-18 DIAGNOSIS — M6530 Trigger finger, unspecified finger: Secondary | ICD-10-CM

## 2015-06-18 MED ORDER — METHYLPREDNISOLONE ACETATE 40 MG/ML IJ SUSP
40.0000 mg | Freq: Once | INTRAMUSCULAR | Status: AC
Start: 2015-06-18 — End: 2015-06-18
  Administered 2015-06-18: 40 mg via INTRA_ARTICULAR

## 2015-06-18 NOTE — Patient Instructions (Signed)
You have a trigger thumb. You were given a cortisone injection today. Wear wrist brace as you have been for the next week then as needed. Heat may help with motion. Meloxicam as you have been. Follow up with me in 1 month.  Your left wrist pain is due to extensor tendinitis. Consider a wrist brace here. Cortisone shot is an option but is not as effective for this specific tendinitis.

## 2015-06-22 DIAGNOSIS — M6530 Trigger finger, unspecified finger: Secondary | ICD-10-CM | POA: Insufficient documentation

## 2015-06-22 DIAGNOSIS — M25532 Pain in left wrist: Secondary | ICD-10-CM | POA: Insufficient documentation

## 2015-06-22 NOTE — Assessment & Plan Note (Signed)
Right trigger thumb - discussed options and went ahead with injection today.  Thumb spice. Heat and meloxicam.  After informed written consent patient was seated on exam table.  Area overlying 1st A1 pulley of right hand prepped with alcohol swab then injected with 97mL of depomedrol due to lidocaine allergy.  Patient tolerated procedure well without immediate complications.

## 2015-06-22 NOTE — Progress Notes (Signed)
PCP: Nance Pear., NP Consultation requested by Elyn Aquas [facility]  Subjective:   HPI: Patient is a 60 y.o. female here for right thumb injury.  Patient reports on 12/15 her thumb at [facility] 'popped out' when moving boxes. She put this back in place herself - mostly improved after this. Then on 1/20 she fell off a treadmill and jammed right thumb after putting hand out to break her fall. Pain is 6/10 and sharp - thumb now catching and locking on volar side. Has been taking ibuprofen, using a thumb brace. No skin changes, fever. Some pain and swelling left wrist dorsally as well.  Past Medical History  Diagnosis Date  . GERD (gastroesophageal reflux disease)   . Hyperlipidemia   . History of seizure age 77    as a reaction to Penicillin - no seizures since  . Migraines   . Arthritis     hips and knees  . History of gastric ulcer     as a teenager  . UTI (lower urinary tract infection) 02/17/2014  . Fibromyalgia   . Asthma     daily inhaler, prn inhaler and neb.  Marland Kitchen History of thyroid cancer     s/p thyroidectomy  . Breast cancer ([place]) 01/2014    left  . Family history of anesthesia complication     twin brother aspirated and died on OR table, per pt.  . Abnormally small mouth   . Dental bridge present     upper front and lower right  . Dental crowns present     x 3  . Hypothyroidism     Current Outpatient Prescriptions on File Prior to Visit  Medication Sig Dispense Refill  . albuterol (PROAIR HFA) 108 (90 Base) MCG/ACT inhaler INHALE 2 PUFFS INTO THE LUNGS EVERY 6 HOURS AS NEEDED 25.5 g 1  . albuterol (PROVENTIL) (5 MG/ML) 0.5% nebulizer solution Take 2.5 mg by nebulization every 6 (six) hours as needed for wheezing or shortness of breath.    . anastrozole (ARIMIDEX) 1 MG tablet Take 1 tablet (1 mg total) by mouth daily. 90 tablet 3  . Calcium Carbonate (CALCIUM 500 PO) Take 1 tablet by mouth daily.     . diphenhydrAMINE (BENADRYL) 25 MG tablet Take 1 tablet an hour  before MRI.  Then take 1 tablet at bedtime as needed, per your usual regimen. 30 tablet 0  . DULoxetine (CYMBALTA) 60 MG capsule Take 1 capsule (60 mg total) by mouth daily. 90 capsule 1  . EPINEPHrine 0.3 mg/0.3 mL IJ SOAJ injection Inject 0.3 mLs (0.3 mg total) into the muscle once. 2 Device 0  . Ergocalciferol (VITAMIN D2) 400 UNITS TABS Take 1 tablet by mouth daily.    . Fluticasone-Salmeterol (ADVAIR DISKUS) 250-50 MCG/DOSE AEPB Inhale 1 puff into the lungs 2 (two) times daily. 180 each 1  . Investigational palbociclib (IBRANCE) 125 MG capsule [facility] AFT-05 [name] Take 1 capsule (125 mg total) by mouth daily. Take with food. Swallow whole. Do not chew. Take on days 1-21. Repeat every 28 days. 69 capsule 0  . levothyroxine (SYNTHROID, LEVOTHROID) 175 MCG tablet Take 1 tablet (175 mcg total) by mouth daily. Take an additional half tablet =87.28mcg on Monday and Friday (total 262.78mcg) 102 tablet 1  . meclizine (ANTIVERT) 50 MG tablet Take 0.5 tablets (25 mg total) by mouth 3 (three) times daily as needed for dizziness or nausea. 21 tablet 0  . meloxicam (MOBIC) 7.5 MG tablet TAKE 1 TABLET DAILY 90 tablet 1  .  Multiple Vitamin (MULTIVITAMIN) tablet Take 1 tablet by mouth daily.      Marland Kitchen neomycin-polymyxin-hydrocortisone (CORTISPORIN) otic solution Place 3 drops into the left ear 4 (four) times daily. 10 mL 0  . nitroGLYCERIN (NITROSTAT) 0.4 MG SL tablet Place 1 tablet (0.4 mg total) under the tongue every 5 (five) minutes as needed for chest pain. 30 tablet 0  . Omega-3 Fatty Acids (FISH OIL) 1200 MG CAPS Take 1 capsule by mouth daily.      Marland Kitchen oxybutynin (DITROPAN-XL) 5 MG 24 hr tablet Take 1 tablet (5 mg total) by mouth daily. 90 tablet 1  . oxyCODONE-acetaminophen (ROXICET) 5-325 MG per tablet 1-2 tablets q 4h prn 20 tablet 0  . simvastatin (ZOCOR) 40 MG tablet Take 1 tablet (40 mg total) by mouth daily. 90 tablet 1  . traMADol (ULTRAM) 50 MG tablet Take 1 tablet (50 mg total) by mouth  every 12 (twelve) hours as needed. 60 tablet 0   No current facility-administered medications on file prior to visit.    Past Surgical History  Procedure Laterality Date  . Cholecystectomy  1990  . Breast surgery  2011    left breast biposy  . Appendectomy  2004  . Tonsillectomy and adenoidectomy  2000  . Abdominal hysterectomy  2004    complete  . Thyroidectomy  2000  . Colonoscopy w/ polypectomy  06/2009  . Tumor excision      from thoracic spine  . Incontinence surgery  2004  . Achilles tendon repair Right   . Orif toe fracture Right     great toe  . Knee arthroscopy Bilateral     x 6 each knee  . Ligament repair Right     thumb/wrist  . Eye surgery Right 2013    exc. warts from underneath eyelid  . Breast lumpectomy with needle localization and axillary sentinel lymph node bx Left 02/23/2014    Procedure: BREAST LUMPECTOMY WITH NEEDLE LOCALIZATION AND AXILLARY SENTINEL LYMPH NODE BIOPSY;  Surgeon: Excell Seltzer, MD;  Location: [facility];  Service: General;  Laterality: Left;  . Portacath placement N/A 03/24/2014    Procedure: INSERTION PORT-A-CATH;  Surgeon: Excell Seltzer, MD;  Location: [facility];  Service: General;  Laterality: N/A;    Allergies  Allergen Reactions  . Bee Venom Anaphylaxis  . Contrast Media [Iodinated Diagnostic Agents] Shortness Of Breath  . Iodine Other (See Comments)    CARDIAC ARREST  . Latex Anaphylaxis and Rash  . Penicillins Shortness Of Breath, Rash and Other (See Comments)    SEIZURE  . Shellfish Allergy Shortness Of Breath  . Aspirin Rash and Other (See Comments)    GI UPSET  . Erythromycin Swelling and Rash    SWELLING OF JOINTS  . Lidocaine Swelling    SWELLING OF MOUTH AND THROAT  . Symbicort [Budesonide-Formoterol Fumarate] Other (See Comments)    BURNING OF TONGUE AND LIPS  . Codeine Rash  . Compazine [Prochlorperazine Maleate] Rash    Rash on face,chest, arms, back  . Pentazocine Lactate  Rash  . Sulfonamide Derivatives Rash    Social History   Social History  . Marital Status: Married    Spouse Name: N/A  . Number of Children: N/A  . 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 Smoking status: Never Smoker   . Smokeless tobacco: Never Used  . Alcohol Use: No  . Drug Use: No  .  Sexual Activity: Not on file     Comment: menarche age 80, P 2, first birth age 24, menopause age 33, Premarin x 10 yrs   Other Topics Concern  . Not on file   Social History Narrative   Regular exercise: yes          Family History  Problem Relation Age of Onset  . Alcohol abuse Mother   . Arthritis Mother   . Hypertension Mother   . Bipolar disorder Mother   . Breast cancer Mother 52    unconfirmed  . Lung cancer Mother 9    smoker  . Alcohol abuse Father   . Cancer Father     lung  . Hyperlipidemia Father   . Kidney disease Father   . Diabetes Father   . Arthritis Maternal Grandmother   . Diabetes Paternal Grandmother   . Thyroid cancer Sister 16    type?; currently 7  . Other Sister     ovarian tumor @ 108; TAH/BSO  . Thyroid cancer Brother     dx 76s; currently 82  . Breast cancer Maternal Aunt     dx 6s; deceased 58  . Thyroid cancer Paternal Aunt     All 3 paternal aunts with thyroid ca in 30s/40s  . Lung cancer Paternal Aunt     2 of 3 paternal aunts with lung cancer    BP 147/92 mmHg  Pulse 71  Ht 5\' 7"  (1.702 m)  Wt 258 lb (117.028 kg)  BMI 40.40 kg/m2  Review of Systems: See HPI above.    Objective:  Physical Exam:  Gen: NAD  Right thumb: No gross deformity, swelling, bruising, malrotation or angulation. Catching on flexion of IP joint. Nodule palpated at A1 pulley, tender here. Collateral ligaments intact. 5/5 strength with flexion and extension. NVI distally.  Left thumb: FROM IP and MCP without pain.  Left wrist: Mild tenderness dorsal wrist over extensor tendons as they come  into the hand. FROM without pain. NVI distally.    Assessment & Plan:  1. Right trigger thumb - discussed options and went ahead with injection today.  Thumb spice. Heat and meloxicam.  After informed written consent patient was seated on exam table.  Area overlying 1st A1 pulley of right hand prepped with alcohol swab then injected with 35mL of depomedrol due to lidocaine allergy.  Patient tolerated procedure well without immediate complications.  2. Left wrist pain - 2/2 extensor tendinitis.  Consider wrist brace.  Continue meloxicam.

## 2015-06-22 NOTE — Assessment & Plan Note (Signed)
2/2 extensor tendinitis.  Consider wrist brace.  Continue meloxicam.

## 2015-07-14 ENCOUNTER — Other Ambulatory Visit: Payer: Self-pay | Admitting: *Deleted

## 2015-07-14 NOTE — Telephone Encounter (Addendum)
Received fax from [facility] requesting refills of duloxetine and meloxicam. Looks like pt picked up written Rx of duloxetine on 06/15/15. Sent mychart message to pt for clarification.

## 2015-07-19 MED ORDER — DULOXETINE HCL 60 MG PO CPEP: 60.0000 mg | ORAL_CAPSULE | Freq: Every day | ORAL | Status: DC

## 2015-07-19 MED ORDER — MELOXICAM 7.5 MG PO TABS: 7.5000 mg | ORAL_TABLET | Freq: Every day | ORAL | Status: DC

## 2015-07-19 NOTE — Telephone Encounter (Signed)
Michelle Kennedy--  I attempted to send refills of duloxetine and meloxicam but got a potential drug drug interaction for duloxetine and tramadol. Was unsure if we had addressed this before?  Please advise:  High   Drug-Drug: traMADol and DULoxetine  Co-administration of Serotonin Norepi Reuptake Inhibitors with Tramadol may result in the development of serotonin syndrome (eg. agitation, altered consciousness, ataxia, myoclonus, overactive reflexes, shivering).  Details Override Reason.Marland KitchenMarland KitchenBenefit outweighs riskDose AppropriateClinician Reviewed   DULoxetine (CYMBALTA) 60 MG capsule  Prescription. Reordered. Long-term.   DiscontinuetraMADol (ULTRAM) 50 MG tablet  Prescription. Active.

## 2015-07-19 NOTE — Telephone Encounter (Signed)
Ok to send duloxetine refill but please notify pt of possible drug interaction and let her know that I would advise her to stop tramadol and follow back up with Dr. Lindi Adie for further pain med recommendations if she is requiring ongoing pain medication. Otherwise, can switch to tylenol.

## 2015-07-19 NOTE — Telephone Encounter (Signed)
Duloxetine Rx printed and was faxed to [facility].

## 2015-07-19 NOTE — Telephone Encounter (Signed)
Notified pt and she voices understanding..  States she will talk with Dr Lindi Adie for further pain medication recommendations. Tramadol removed from medication list.

## 2015-07-30 ENCOUNTER — Other Ambulatory Visit: Payer: Self-pay | Admitting: Medical Oncology

## 2015-07-30 DIAGNOSIS — C50412 Malignant neoplasm of upper-outer quadrant of left female breast: Secondary | ICD-10-CM

## 2015-07-31 NOTE — Assessment & Plan Note (Signed)
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 M0 stage IIB Adjuvant chemotherapy Completed 4 cycles of dose dense Adriamycin and Cytoxan. 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 Grade 2 neutropenia: Did not require dose reduction, close monitoring of blood counts, ANC1.5 on 05/10/15 2. Otherwise no major side effects to Ibrance 3. 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 Right hip pain especially after being sedentary : related to anastrozole. 5. Recent otitis externa December 2016: Treated with antibiotics by her PCP liver function tests: Normal. continue the same dosage of Ibrance.  Survivorship: Patient is now swimming and exercising every day. Is staying active. Breast Cancer Surveillance: 1. Breast exam 05/10/2015: Normal except for scar tissue related to prior surgery and radiation 2. Mammogram will need to be scheduled for January 2017. Patient plans to go to [place] for the holidays   RTC 2 months for labs and follow-up

## 2015-08-02 ENCOUNTER — Encounter: Payer: Self-pay | Admitting: Medical Oncology

## 2015-08-02 ENCOUNTER — Encounter: Payer: Self-pay | Admitting: Hematology and Oncology

## 2015-08-02 ENCOUNTER — Ambulatory Visit (HOSPITAL_BASED_OUTPATIENT_CLINIC_OR_DEPARTMENT_OTHER): Payer: 59 | Admitting: Hematology and Oncology

## 2015-08-02 ENCOUNTER — Telehealth: Payer: Self-pay | Admitting: Family

## 2015-08-02 ENCOUNTER — Other Ambulatory Visit (HOSPITAL_BASED_OUTPATIENT_CLINIC_OR_DEPARTMENT_OTHER): Payer: 59

## 2015-08-02 VITALS — BP 155/86 | HR 63 | Temp 97.5°F | Resp 18 | Ht 67.0 in | Wt 261.6 lb

## 2015-08-02 DIAGNOSIS — Z006 Encounter for examination for normal comparison and control in clinical research program: Secondary | ICD-10-CM

## 2015-08-02 DIAGNOSIS — I890 Lymphedema, not elsewhere classified: Secondary | ICD-10-CM

## 2015-08-02 DIAGNOSIS — C50412 Malignant neoplasm of upper-outer quadrant of left female breast: Secondary | ICD-10-CM

## 2015-08-02 DIAGNOSIS — Z85850 Personal history of malignant neoplasm of thyroid: Secondary | ICD-10-CM

## 2015-08-02 DIAGNOSIS — Z79811 Long term (current) use of aromatase inhibitors: Secondary | ICD-10-CM

## 2015-08-02 DIAGNOSIS — Z86718 Personal history of other venous thrombosis and embolism: Secondary | ICD-10-CM

## 2015-08-02 DIAGNOSIS — G620 Drug-induced polyneuropathy: Secondary | ICD-10-CM | POA: Diagnosis not present

## 2015-08-02 DIAGNOSIS — Z7901 Long term (current) use of anticoagulants: Secondary | ICD-10-CM

## 2015-08-02 DIAGNOSIS — Z170 Estrogen receptor positive status [ER+]: Secondary | ICD-10-CM

## 2015-08-02 LAB — COMPREHENSIVE METABOLIC PANEL
ALT: 32 U/L (ref 0–55)
ANION GAP: 6 meq/L (ref 3–11)
AST: 25 U/L (ref 5–34)
Albumin: 3.7 g/dL (ref 3.5–5.0)
Alkaline Phosphatase: 62 U/L (ref 40–150)
BILIRUBIN TOTAL: 0.41 mg/dL (ref 0.20–1.20)
BUN: 13 mg/dL (ref 7.0–26.0)
CO2: 28 meq/L (ref 22–29)
CREATININE: 0.9 mg/dL (ref 0.6–1.1)
Calcium: 9.6 mg/dL (ref 8.4–10.4)
Chloride: 107 mEq/L (ref 98–109)
EGFR: 73 mL/min/{1.73_m2} — ABNORMAL LOW (ref >90)
GLUCOSE: 97 mg/dL (ref 70–140)
Potassium: 4.8 mEq/L (ref 3.5–5.1)
Sodium: 140 mEq/L (ref 136–145)
TOTAL PROTEIN: 6.6 g/dL (ref 6.4–8.3)

## 2015-08-02 LAB — CBC WITH DIFFERENTIAL/PLATELET
BASO%: 1.5 % (ref 0.0–2.0)
Basophils Absolute: 0.1 10*3/uL (ref 0.0–0.1)
EOS%: 2.1 % (ref 0.0–7.0)
Eosinophils Absolute: 0.1 10*3/uL (ref 0.0–0.5)
HEMATOCRIT: 40.9 % (ref 34.8–46.6)
HGB: 13.3 g/dL (ref 11.6–15.9)
LYMPH#: 1.1 10*3/uL (ref 0.9–3.3)
LYMPH%: 33.8 % (ref 14.0–49.7)
MCH: 26.9 pg (ref 25.1–34.0)
MCHC: 32.5 g/dL (ref 31.5–36.0)
MCV: 82.6 fL (ref 79.5–101.0)
MONO#: 0.5 10*3/uL (ref 0.1–0.9)
MONO%: 16.3 % — ABNORMAL HIGH (ref 0.0–14.0)
NEUT%: 46.3 % (ref 38.4–76.8)
NEUTROS ABS: 1.5 10*3/uL (ref 1.5–6.5)
PLATELETS: 192 10*3/uL (ref 145–400)
RBC: 4.95 10*6/uL (ref 3.70–5.45)
RDW: 16.3 % — ABNORMAL HIGH (ref 11.2–14.5)
WBC: 3.3 10*3/uL — AB (ref 3.9–10.3)

## 2015-08-02 MED ORDER — MELOXICAM 7.5 MG PO TABS: 7.5000 mg | ORAL_TABLET | Freq: Every day | ORAL | Status: DC

## 2015-08-02 MED ORDER — SIMVASTATIN 40 MG PO TABS: 40.0000 mg | ORAL_TABLET | Freq: Every day | ORAL | Status: DC

## 2015-08-02 MED ORDER — DULOXETINE HCL 60 MG PO CPEP: 60.0000 mg | ORAL_CAPSULE | Freq: Every day | ORAL | Status: DC

## 2015-08-02 MED ORDER — INV-PALBOCICLIB 125 MG CAPS #23 ALLIANCE FOUNDATION AFT-05 (PALLAS): 125.0000 mg | ORAL_CAPSULE | Freq: Every day | ORAL | Status: DC

## 2015-08-02 NOTE — Telephone Encounter (Signed)
[facility] and 90 days to [facility]. Notified pt.

## 2015-08-02 NOTE — Telephone Encounter (Signed)
Reason for call: Pt would like 30 day of [facility]oxicam [facility] on [address] and refills (in 90 day supply) sent to [facility]. It changed the beginning of the year. It looks like we sent to old pharmacy on 07/19/15.

## 2015-08-02 NOTE — Progress Notes (Signed)
Patient Care Team: Debbrah Alar, NP as PCP - General (Internal Medicine) Excell Seltzer, MD as Consulting Physician (General Surgery) Nicholas Lose, MD as Consulting Physician (Hematology and Oncology) [name], MD as Attending Physician (Radiation Oncology) Katheran James., MD (Endocrinology)  DIAGNOSIS: Breast cancer of upper-outer quadrant of left female breast [facility])   Staging form: Breast, AJCC 7th Edition     Clinical: Stage IIB (T3, N0, cM0) - Unsigned       Staging comments: Staged at breast conference 12/31/13.      Pathologic: Stage IIB (T2, N1a, cM0) - Signed by Rulon Eisenmenger, MD on 03/10/2014   SUMMARY OF ONCOLOGIC HISTORY:   Breast cancer of upper-outer quadrant of left female breast ([place])   12/22/2013 Mammogram Left breast upper-outer quadrant 1.8 x 1.4 x 1.6 cm mass with left axillary lymph node measuring 3.6 mm   12/30/2013 Breast MRI Dominant enhancing left upper outer quadrant irregular mass encompassing a confluent area of abnormal clumped nodular enhancement, 5.5 cm overall, 6 mm masslike enhancement central left breast     02/23/2014 Surgery Left lumpectomy: Invasive ductal carcinoma grade 2 spending 3.8 cm intermediate grade DCIS with lymphovascular invasion margins negative one out of 4 SLN positive, ER 99%, PR 100%, HER-2 negative ratio 0.74, T2, N1, M0 stage IIB   03/19/2014 PET scan No evidence of distant metastatic disease   03/31/2014 - 08/18/2014 Chemotherapy Adjuvant chemotherapy with dose dense Adriamycin Cytoxan x4 followed by Abraxane weekly x12   08/28/2014 Imaging Left brachial vein thrombus   09/11/2014 -  Radiation Therapy Adjuvant radiation therapy   11/20/2014 -  Anti-estrogen oral therapy Anastrozole 1 mg daily + Ibrance ([name] clinical trial)    CHIEF COMPLIANT: [name]  Clinical trial with Ibrance and  anastrozole  INTERVAL HISTORY: Michelle Kennedy is a  60 year old with above-mentioned left breast cancer who underwent lumpectomy and  adjuvant chemotherapy followed by radiation. She is currently on antiestrogen therapy with anastrozole in combination with Ibrance on [name]  Clinical trial. She is tolerating Ibrance extremely well without any side effects. She does not have fatigue other GI disturbances. 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 She is concerned about her weight gain of 10 pounds.  REVIEW OF SYSTEMS:   Constitutional: Denies fevers, chills or abnormal weight loss Eyes: Denies blurriness of vision Ears, nose, mouth, throat, and face: Denies mucositis or sore throat Respiratory: Denies cough, dyspnea or wheezes Cardiovascular: Denies palpitation, chest discomfort Gastrointestinal:  Denies nausea, heartburn or change in bowel habits Skin: Denies abnormal skin rashes Lymphatics: Denies new lymphadenopathy or easy bruising Neurological:Denies numbness, tingling or new weaknesses Behavioral/Psych: Mood is stable, no new changes  Extremities: No lower extremity edema Breast:   Intermittent sharp discomfort along the surgical scar All other systems were reviewed with the patient and are negative.  I have reviewed the past medical history, past surgical history, social history and family history with the patient and they are unchanged from previous note.  ALLERGIES:  is allergic to bee venom; contrast media; iodine; latex; penicillins; shellfish allergy; aspirin; erythromycin; lidocaine; symbicort; codeine; compazine; pentazocine lactate; and sulfonamide derivatives.  MEDICATIONS:  Current Outpatient Prescriptions  Medication Sig Dispense Refill  . albuterol (PROAIR HFA) 108 (90 Base) MCG/ACT inhaler INHALE 2 PUFFS INTO THE LUNGS EVERY 6 HOURS AS NEEDED 25.5 g 1  . albuterol (PROVENTIL) (5 MG/ML) 0.5% nebulizer solution Take 2.5 mg by nebulization every 6 (six) hours as needed for wheezing or shortness  of breath.    . anastrozole (ARIMIDEX) 1 MG tablet Take 1 tablet (1  mg total) by mouth daily. 90 tablet 3  . Calcium Carbonate (CALCIUM 500 PO) Take 1 tablet by mouth daily.     . diphenhydrAMINE (BENADRYL) 25 MG tablet Take 1 tablet an hour before MRI.  Then take 1 tablet at bedtime as needed, per your usual regimen. 30 tablet 0  . DULoxetine (CYMBALTA) 60 MG capsule Take 1 capsule (60 mg total) by mouth daily. 90 capsule 1  . EPINEPHrine 0.3 mg/0.3 mL IJ SOAJ injection Inject 0.3 mLs (0.3 mg total) into the muscle once. 2 Device 0  . Ergocalciferol (VITAMIN D2) 400 UNITS TABS Take 1 tablet by mouth daily.    . Fluticasone-Salmeterol (ADVAIR DISKUS) 250-50 MCG/DOSE AEPB Inhale 1 puff into the lungs 2 (two) times daily. 180 each 1  . Investigational palbociclib (IBRANCE) 125 MG capsule [facility] AFT-05 [name] Take 1 capsule (125 mg total) by mouth daily. Take with food. Swallow whole. Do not chew. Take on days 1-21. Repeat every 28 days. 69 capsule 0  . levothyroxine (SYNTHROID, LEVOTHROID) 175 MCG tablet Take 1 tablet (175 mcg total) by mouth daily. Take an additional half tablet =87.80mg on Monday and Friday (total 262.523m) 102 tablet 1  . meclizine (ANTIVERT) 50 MG tablet Take 0.5 tablets (25 mg total) by mouth 3 (three) times daily as needed for dizziness or nausea. 21 tablet 0  . meloxicam (MOBIC) 7.5 MG tablet Take 1 tablet (7.5 mg total) by mouth daily. 90 tablet 1  . Multiple Vitamin (MULTIVITAMIN) tablet Take 1 tablet by mouth daily.      . Marland Kitcheneomycin-polymyxin-hydrocortisone (CORTISPORIN) otic solution Place 3 drops into the left ear 4 (four) times daily. 10 mL 0  . nitroGLYCERIN (NITROSTAT) 0.4 MG SL tablet Place 1 tablet (0.4 mg total) under the tongue every 5 (five) minutes as needed for chest pain. 30 tablet 0  . Omega-3 Fatty Acids (FISH OIL) 1200 MG CAPS Take 1 capsule by mouth daily.      . Marland Kitchenxybutynin (DITROPAN-XL) 5 MG 24 hr tablet Take 1 tablet (5 mg total) by mouth daily. 90 tablet 1  . oxyCODONE-acetaminophen (ROXICET) 5-325 MG per  tablet 1-2 tablets q 4h prn 20 tablet 0  . simvastatin (ZOCOR) 40 MG tablet Take 1 tablet (40 mg total) by mouth daily. 90 tablet 1   No current facility-administered medications for this visit.    PHYSICAL EXAMINATION: ECOG PERFORMANCE STATUS: 0 - Asymptomatic  Filed Vitals:   08/02/15 1139  BP: 155/86  Pulse: 63  Temp: 97.5 F (36.4 C)  Resp: 18   Filed Weights   08/02/15 1139  Weight: 261 lb 9.6 oz (118.661 kg)    GENERAL:alert, no distress and comfortable SKIN: skin color, texture, turgor are normal, no rashes or significant lesions EYES: normal, Conjunctiva are pink and non-injected, sclera clear OROPHARYNX:no exudate, no erythema and lips, buccal mucosa, and tongue normal  NECK: supple, thyroid normal size, non-tender, without nodularity LYMPH:  no palpable lymphadenopathy in the cervical, axillary or inguinal LUNGS: clear to auscultation and percussion with normal breathing effort HEART: regular rate & rhythm and no murmurs and no lower extremity edema ABDOMEN:abdomen soft, non-tender and normal bowel sounds MUSCULOSKELETAL:no cyanosis of digits and no clubbing  NEURO: alert & oriented x 3 with fluent speech, no focal motor/sensory deficits EXTREMITIES: No lower extremity edema  LABORATORY DATA:  I have reviewed the data as listed   Chemistry  Component Value Date/Time   NA 140 05/10/2015 0916   NA 138 09/16/2014 1140   K 4.3 05/10/2015 0916   K 3.8 09/16/2014 1140   CL 103 09/16/2014 1140   CO2 26 05/10/2015 0916   CO2 27 09/16/2014 1140   BUN 11.8 05/10/2015 0916   BUN 16 09/16/2014 1140   CREATININE 0.9 05/10/2015 0916   CREATININE 0.75 09/16/2014 1140   CREATININE 0.83 05/31/2011 0905      Component Value Date/Time   CALCIUM 9.5 05/10/2015 0916   CALCIUM 8.8 09/16/2014 1140   ALKPHOS 68 05/10/2015 0916   ALKPHOS 52 08/11/2013 0918   AST 21 05/10/2015 0916   AST 19 08/11/2013 0918   ALT 26 05/10/2015 0916   ALT 24 08/11/2013 0918   BILITOT  0.43 05/10/2015 0916   BILITOT 0.4 08/11/2013 0918       Lab Results  Component Value Date   WBC 3.3* 08/02/2015   HGB 13.3 08/02/2015   HCT 40.9 08/02/2015   MCV 82.6 08/02/2015   PLT 192 08/02/2015   NEUTROABS 1.5 08/02/2015     ASSESSMENT & PLAN:  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 M0 stage IIB Adjuvant chemotherapy Completed 4 cycles of dose dense Adriamycin and Cytoxan. 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 Grade 2 neutropenia: Did not require dose reduction, close monitoring of blood counts, ANC1.5 on 08/02/15 2. Otherwise no major side effects to Ibrance 3. 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  Survivorship: Patient is now swimming and exercising every day. Is staying active.  In spite of this she has gained 10 pounds weight. I discussed with her about enrolling in clinical trial for weight loss in breast cancer survivors.  Breast Cancer Surveillance: 1. Breast exam 05/10/2015: Normal except for scar tissue related to prior surgery and radiation 2. Mammogram January 2017  Category B breast density.  No mammographic abnormalities postsurgical changes. Patient plans to go to [place] in  June 2017  RTC3 months for labs and follow-up   No orders of the defined types were placed in this encounter.   The patient has a good understanding of the overall plan. she agrees with it. she will call with any problems that may develop before the next visit here.   Rulon Eisenmenger, MD 08/02/2015

## 2015-08-02 NOTE — Telephone Encounter (Signed)
Received fax from [facility] for refills of duloxetine, simvastatin and meloxicam. Meloxicam previously sent. Sent duloxetine and simvastatin refills.

## 2015-08-02 NOTE — Telephone Encounter (Signed)
Simvastatin and duloxetine rxs printed instead of transmitting electronically. Rxs faxed to [facility].

## 2015-08-02 NOTE — Progress Notes (Signed)
[name]: Cycle 9 (Cycle 10, Cycle 11) Patient here for start of cycle 9, 10, and 11. I met with patient in exam room after her study required labs were drawn. Patient to see Dr. Lindi Adie for f/u and assessment. V/S completed. Labs resulted. Patient reports no new symptoms or concerns. Also, patient denies nausea, vomiting and bowel or bladder concerns. She denies headaches or fatigue and muscle pain. Patient confirms to continue with neuropathy to fingers, reported at baseline (Grade 1), and reports no change. Patient states that she has been active in walking, weather permitting. Patient returned empty study dispensed medication bottles from cycle 6, 7 and 8, as well as medication diaries for all three cycles. Patient documented daily with no missed study medication as well as anti-hormone meds and confirms no issues with self administration. Per Dr. Geralyn Flash assessment of patient, labs and NCS labs, patient cleared to continue with current dosage of 125 mg Palbociclib. Study generated medication bottle assignments provided to PharmD Raul Del for cycle 9, 10 and 11. Patient was dispensed study medication Palbociclib for next 3 cycles as well as medication diaries for both Palbociclib and anti-hormone documentation. Patient knows to call Dr. Lindi Adie or myself with any questions or concerns. I thanked patient for her time and continued support of study. Patient to return in three months for cycles 12, 13, 14. Adele Dan, RN, BSN Clinical Research 08/02/2015 2:04 PM

## 2015-08-02 NOTE — Addendum Note (Signed)
Addended by: Kelle Darting A on: 08/02/2015 04:45 PM   Modules accepted: Orders

## 2015-08-03 ENCOUNTER — Telehealth: Payer: Self-pay | Admitting: Hematology and Oncology

## 2015-08-03 NOTE — Telephone Encounter (Signed)
lvm for pt regarding to [facility] appt

## 2015-08-04 ENCOUNTER — Other Ambulatory Visit: Payer: Self-pay | Admitting: Medical Oncology

## 2015-08-05 ENCOUNTER — Telehealth: Payer: Self-pay | Admitting: Hematology and Oncology

## 2015-08-05 NOTE — Telephone Encounter (Signed)
Gave adn printed papt sched and avs fo rpt for June

## 2015-09-07 ENCOUNTER — Telehealth: Payer: Self-pay | Admitting: *Deleted

## 2015-09-07 MED ORDER — LEVOTHYROXINE SODIUM 175 MCG PO TABS: 175.0000 ug | ORAL_TABLET | Freq: Every day | ORAL | Status: DC

## 2015-09-07 NOTE — Telephone Encounter (Signed)
Received fax from [facility] for levothyroxine. Rx printed and forwarded to PCP for signature. Will fax to [phone]. Pt is due for 6 month follow up in May with Inda Castle, NP

## 2015-09-08 NOTE — Telephone Encounter (Signed)
Rx faxed on 09/07/15 at 3:46pm.

## 2015-09-13 ENCOUNTER — Encounter: Payer: Self-pay | Admitting: Family

## 2015-09-13 ENCOUNTER — Ambulatory Visit (INDEPENDENT_AMBULATORY_CARE_PROVIDER_SITE_OTHER): Payer: 59 | Admitting: Family

## 2015-09-13 VITALS — BP 127/83 | HR 89 | Temp 97.8°F | Resp 18 | Ht 67.0 in | Wt 265.0 lb

## 2015-09-13 DIAGNOSIS — M2550 Pain in unspecified joint: Secondary | ICD-10-CM

## 2015-09-13 LAB — CBC WITH DIFFERENTIAL/PLATELET
BASOS PCT: 0.8 % (ref 0.0–3.0)
Basophils Absolute: 0 10*3/uL (ref 0.0–0.1)
EOS ABS: 0.1 10*3/uL (ref 0.0–0.7)
Eosinophils Relative: 1.7 % (ref 0.0–5.0)
HEMATOCRIT: 39.9 % (ref 36.0–46.0)
Hemoglobin: 13 g/dL (ref 12.0–15.0)
LYMPHS ABS: 1 10*3/uL (ref 0.7–4.0)
LYMPHS PCT: 24.7 % (ref 12.0–46.0)
MCHC: 32.7 g/dL (ref 30.0–36.0)
MCV: 81.4 fl (ref 78.0–100.0)
MONO ABS: 0.2 10*3/uL (ref 0.1–1.0)
Monocytes Relative: 3.8 % (ref 3.0–12.0)
NEUTROS ABS: 2.7 10*3/uL (ref 1.4–7.7)
NEUTROS PCT: 69 % (ref 43.0–77.0)
PLATELETS: 365 10*3/uL (ref 150.0–400.0)
RBC: 4.9 Mil/uL (ref 3.87–5.11)
RDW: 17.6 % — AB (ref 11.5–15.5)
WBC: 4 10*3/uL (ref 4.0–10.5)

## 2015-09-13 LAB — RHEUMATOID FACTOR

## 2015-09-13 LAB — SEDIMENTATION RATE: Sed Rate: 13 mm/hr (ref 0–22)

## 2015-09-13 LAB — URIC ACID: Uric Acid, Serum: 4.3 mg/dL (ref 2.4–7.0)

## 2015-09-13 MED ORDER — MELOXICAM 7.5 MG PO TABS
7.5000 mg | ORAL_TABLET | Freq: Every day | ORAL | Status: DC
Start: 2015-09-13 — End: 2015-12-27

## 2015-09-13 NOTE — Patient Instructions (Addendum)
Please complete lab work prior to leaving.  Start meloxicam. You will be contacted about your referral to Dr. Barbaraann Barthel.

## 2015-09-13 NOTE — Progress Notes (Signed)
Subjective:    Patient ID: Michelle Kennedy, female    DOB: 08-Oct-1955, 60 y.o.   MRN: [id number]  HPI  Pt presents today with chief complaint of multifocal joint pain. She reports bilateral wrist pain x 1 month, right elbow pain since this AM and R shoulder pain since 7/16.  Has been occuring x 1 month. Denies wrist injury, fevers or repetitive motions. Using ibuprofen with little improvement.    R Shoulder pain- this has been present since 2016. Pain is located in the right anterior shoulder. Unable to lift the right hand over he head. Worse pain with reaching.  Pt is left hand dominant. She reports reports some numbness in her fingers since her chemotherapy.    Review of Systems    see HPI  Past Medical History  Diagnosis Date  . GERD (gastroesophageal reflux disease)   . Hyperlipidemia   . History of seizure age 55    as a reaction to Penicillin - no seizures since  . Migraines   . Arthritis     hips and knees  . History of gastric ulcer     as a teenager  . UTI (lower urinary tract infection) 02/17/2014  . Fibromyalgia   . Asthma     daily inhaler, prn inhaler and neb.  Marland Kitchen History of thyroid cancer     s/p thyroidectomy  . Breast cancer ([facility]) 01/2014    left  . Family history of anesthesia complication     twin brother aspirated and died on OR table, per pt.  . Abnormally small mouth   . Dental bridge present     upper front and lower right  . Dental crowns present     x 3  . Hypothyroidism      Social History   Social History  . Marital Status: Married    Spouse Name: N/A  . Number of Children: N/A  . 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 Smoking status: Never Smoker   . Smokeless tobacco: Never Used  . Alcohol Use: No  . Drug Use: No  . Sexual Activity: Not on file     Comment: menarche age 32, P 2, first birth age 75, menopause age 35, Premarin x 10 yrs   Other Topics Concern  . Not on file    Social History Narrative   Regular exercise: yes          Past Surgical History  Procedure Laterality Date  . Cholecystectomy  1990  . Breast surgery  2011    left breast biposy  . Appendectomy  2004  . Tonsillectomy and adenoidectomy  2000  . Abdominal hysterectomy  2004    complete  . Thyroidectomy  2000  . Colonoscopy w/ polypectomy  06/2009  . Tumor excision      from thoracic spine  . Incontinence surgery  2004  . Achilles tendon repair Right   . Orif toe fracture Right     great toe  . Knee arthroscopy Bilateral     x 6 each knee  . Ligament repair Right     thumb/wrist  . Eye surgery Right 2013    exc. warts from underneath eyelid  . Breast lumpectomy with needle localization and axillary sentinel lymph node bx Left 02/23/2014    Procedure: BREAST LUMPECTOMY WITH NEEDLE LOCALIZATION AND AXILLARY SENTINEL LYMPH NODE BIOPSY;  Surgeon: Excell Seltzer, MD;  Location: [facility]  [place];  Service: General;  Laterality: Left;  . Portacath placement N/A 03/24/2014    Procedure: INSERTION PORT-A-CATH;  Surgeon: Excell Seltzer, MD;  Location: [facility];  Service: General;  Laterality: N/A;    Family History  Problem Relation Age of Onset  . Alcohol abuse Mother   . Arthritis Mother   . Hypertension Mother   . Bipolar disorder Mother   . Breast cancer Mother 62    unconfirmed  . Lung cancer Mother 88    smoker  . Alcohol abuse Father   . Cancer Father     lung  . Hyperlipidemia Father   . Kidney disease Father   . Diabetes Father   . Arthritis Maternal Grandmother   . Diabetes Paternal Grandmother   . Thyroid cancer Sister 43    type?; currently 56  . Other Sister     ovarian tumor @ 42; TAH/BSO  . Thyroid cancer Brother     dx 18s; currently 103  . Breast cancer Maternal Aunt     dx 16s; deceased 69  . Thyroid cancer Paternal Aunt     All 3 paternal aunts with thyroid ca in 30s/40s  . Lung cancer Paternal Aunt     2 of 3  paternal aunts with lung cancer    Allergies  Allergen Reactions  . Bee Venom Anaphylaxis  . Contrast Media [Iodinated Diagnostic Agents] Shortness Of Breath  . Iodine Other (See Comments)    CARDIAC ARREST  . Latex Anaphylaxis and Rash  . Penicillins Shortness Of Breath, Rash and Other (See Comments)    SEIZURE  . Shellfish Allergy Shortness Of Breath  . Aspirin Rash and Other (See Comments)    GI UPSET  . Erythromycin Swelling and Rash    SWELLING OF JOINTS  . Lidocaine Swelling    SWELLING OF MOUTH AND THROAT  . Symbicort [Budesonide-Formoterol Fumarate] Other (See Comments)    BURNING OF TONGUE AND LIPS  . Codeine Rash  . Compazine [Prochlorperazine Maleate] Rash    Rash on face,chest, arms, back  . Pentazocine Lactate Rash  . Sulfonamide Derivatives Rash    Current Outpatient Prescriptions on File Prior to Visit  Medication Sig Dispense Refill  . albuterol (PROAIR HFA) 108 (90 Base) MCG/ACT inhaler INHALE 2 PUFFS INTO THE LUNGS EVERY 6 HOURS AS NEEDED 25.5 g 1  . albuterol (PROVENTIL) (5 MG/ML) 0.5% nebulizer solution Take 2.5 mg by nebulization every 6 (six) hours as needed for wheezing or shortness of breath.    . anastrozole (ARIMIDEX) 1 MG tablet Take 1 tablet (1 mg total) by mouth daily. 90 tablet 3  . Calcium Carbonate (CALCIUM 500 PO) Take 1 tablet by mouth daily.     . diphenhydrAMINE (BENADRYL) 25 MG tablet Take 1 tablet an hour before MRI.  Then take 1 tablet at bedtime as needed, per your usual regimen. 30 tablet 0  . DULoxetine (CYMBALTA) 60 MG capsule Take 1 capsule (60 mg total) by mouth daily. 90 capsule 1  . EPINEPHrine 0.3 mg/0.3 mL IJ SOAJ injection Inject 0.3 mLs (0.3 mg total) into the muscle once. 2 Device 0  . Ergocalciferol (VITAMIN D2) 400 UNITS TABS Take 1 tablet by mouth daily.    . Fluticasone-Salmeterol (ADVAIR DISKUS) 250-50 MCG/DOSE AEPB Inhale 1 puff into the lungs 2 (two) times daily. 180 each 1  . Investigational palbociclib (IBRANCE) 125  MG capsule [facility] AFT-05 [name] Take 1 capsule (125 mg total) by mouth daily. Take  with food. Swallow whole. Do not chew. Take on days 1-21. Repeat every 28 days. 69 capsule 0  . levothyroxine (SYNTHROID, LEVOTHROID) 175 MCG tablet Take 1 tablet (175 mcg total) by mouth daily. Take an additional half tablet =87.77mg on Monday and Friday (total 262.522m) 102 tablet 0  . meclizine (ANTIVERT) 50 MG tablet Take 0.5 tablets (25 mg total) by mouth 3 (three) times daily as needed for dizziness or nausea. 21 tablet 0  . meloxicam (MOBIC) 7.5 MG tablet Take 1 tablet (7.5 mg total) by mouth daily. 90 tablet 1  . Multiple Vitamin (MULTIVITAMIN) tablet Take 1 tablet by mouth daily.      . Marland Kitcheneomycin-polymyxin-hydrocortisone (CORTISPORIN) otic solution Place 3 drops into the left ear 4 (four) times daily. 10 mL 0  . nitroGLYCERIN (NITROSTAT) 0.4 MG SL tablet Place 1 tablet (0.4 mg total) under the tongue every 5 (five) minutes as needed for chest pain. 30 tablet 0  . Omega-3 Fatty Acids (FISH OIL) 1200 MG CAPS Take 1 capsule by mouth daily.      . Marland Kitchenxybutynin (DITROPAN-XL) 5 MG 24 hr tablet Take 1 tablet (5 mg total) by mouth daily. 90 tablet 1  . oxyCODONE-acetaminophen (ROXICET) 5-325 MG per tablet 1-2 tablets q 4h prn 20 tablet 0  . simvastatin (ZOCOR) 40 MG tablet Take 1 tablet (40 mg total) by mouth daily. 90 tablet 1   No current facility-administered medications on file prior to visit.    BP 127/83 mmHg  Pulse 89  Temp(Src) 97.8 F (36.6 C) (Oral)  Resp 18  Ht 5' 7"  (1.702 m)  Wt 265 lb (120.203 kg)  BMI 41.50 kg/m2  SpO2 99%    Objective:   Physical Exam  Constitutional: She appears well-developed and well-nourished.  Cardiovascular: Normal rate, regular rhythm and normal heart sounds.   No murmur heard. Pulmonary/Chest: Effort normal and breath sounds normal. No respiratory distress. She has no wheezes.  Musculoskeletal:  R anterior shoulder tenderness to palpation, decreased  ROM R shouler, + R empty can  Mild bilateral wrist swelling is noted.    R elbow- no swelling, increased pain with flexion   Neurological:  Neg Tinels, + phalans  Psychiatric: She has a normal mood and affect. Her behavior is normal. Judgment and thought content normal.          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  Will obtain ESR, ANA, RA to assess for autoimmune etiology. Obtain CBC to assess WBC for infection, obtain uric acid to assess for gout. Refer to Dr. [facility] for further evaluation. Trial of meloxicam.

## 2015-09-13 NOTE — Progress Notes (Signed)
Pre visit review using our clinic review tool, if applicable. No additional management support is needed unless otherwise documented below in the visit note. 

## 2015-09-14 ENCOUNTER — Ambulatory Visit (INDEPENDENT_AMBULATORY_CARE_PROVIDER_SITE_OTHER): Payer: 59 | Admitting: Family Medicine

## 2015-09-14 ENCOUNTER — Encounter: Payer: Self-pay | Admitting: Family Medicine

## 2015-09-14 VITALS — BP 123/80 | HR 76 | Ht 68.0 in | Wt 263.0 lb

## 2015-09-14 DIAGNOSIS — M25531 Pain in right wrist: Secondary | ICD-10-CM | POA: Diagnosis not present

## 2015-09-14 DIAGNOSIS — M25511 Pain in right shoulder: Secondary | ICD-10-CM | POA: Diagnosis not present

## 2015-09-14 DIAGNOSIS — M654 Radial styloid tenosynovitis [de Quervain]: Secondary | ICD-10-CM | POA: Diagnosis not present

## 2015-09-14 DIAGNOSIS — M25532 Pain in left wrist: Principal | ICD-10-CM

## 2015-09-14 LAB — ANA: ANA: POSITIVE — AB

## 2015-09-14 LAB — ANTI-NUCLEAR AB-TITER (ANA TITER): ANA Titer 1: 1:160 {titer} — ABNORMAL HIGH

## 2015-09-14 MED ORDER — LEVOTHYROXINE SODIUM 175 MCG PO TABS: 175.0000 ug | ORAL_TABLET | Freq: Every day | ORAL | Status: DC

## 2015-09-14 NOTE — Telephone Encounter (Signed)
Received fax from [facility] that they never received our refill on 09/07/15. Refill re-sent.

## 2015-09-14 NOTE — Patient Instructions (Signed)
You have deQuervain's tenosynovitis of your thumbs/wrists. Avoid painful activities as much as possible. Wear the thumb spica brace as often as possible to rest this. Ice 15 minutes at a time 3-4 times a day. A cortisone injection typically helps a great deal with this and is an option - you were given these today Follow up with me in 1 month for reevaluation.  You have severe pec major spasms/strain on the right side. Continue with aleve 2 tabs twice a day with food OR ibuprofen 600mg  three times a day with food. Let me know if you want to do physical therapy. Ice or heat 15 minutes at a time 3-4 times a day. Do the bench press, shoulder fly exercises I showed you 3 sets of 10 once a day with light weight.

## 2015-09-14 NOTE — Addendum Note (Signed)
Addended by: Kelle Darting A on: 09/14/2015 02:59 PM   Modules accepted: Orders

## 2015-09-15 ENCOUNTER — Ambulatory Visit: Payer: 59 | Admitting: Family Medicine

## 2015-09-15 ENCOUNTER — Telehealth: Payer: Self-pay | Admitting: Family

## 2015-09-15 DIAGNOSIS — M25532 Pain in left wrist: Secondary | ICD-10-CM

## 2015-09-15 DIAGNOSIS — M25531 Pain in right wrist: Secondary | ICD-10-CM | POA: Diagnosis not present

## 2015-09-15 DIAGNOSIS — M25511 Pain in right shoulder: Secondary | ICD-10-CM | POA: Insufficient documentation

## 2015-09-15 DIAGNOSIS — R768 Other specified abnormal immunological findings in serum: Secondary | ICD-10-CM

## 2015-09-15 DIAGNOSIS — M654 Radial styloid tenosynovitis [de Quervain]: Secondary | ICD-10-CM | POA: Insufficient documentation

## 2015-09-15 MED ORDER — METHYLPREDNISOLONE ACETATE 40 MG/ML IJ SUSP
40.0000 mg | Freq: Once | INTRAMUSCULAR | Status: AC
  Administered 2015-09-15: 40 mg via INTRA_ARTICULAR

## 2015-09-15 NOTE — Assessment & Plan Note (Signed)
icing, thumb spica braces.  Bilateral cortisone injections given as well.  F/u in 1 month.  After informed written consent patient was seated on exam table.  Ultrasound used to identify 1st dorsal compartment.  Area overlying this prepped with alcohol swab then right 1st dorsal compartment of the wrist injected with 0.5:0.46mL marcaine: depomedrol.  Patient tolerated procedure well without immediate complications.  After informed written consent patient was seated on exam table.  Ultrasound used to identify 1st dorsal compartment.  Area overlying this prepped with alcohol swab then left 1st dorsal compartment of the wrist injected with 0.5:0.29mL marcaine: depomedrol.  Patient tolerated procedure well without immediate complications.

## 2015-09-15 NOTE — Assessment & Plan Note (Signed)
2/2 severe pec major spasms/strain.  NSAIDs with ice/heat as needed.  Consider physical therapy for this - she would like to wait on this.  Shown home exercises to do daily with very light weight.

## 2015-09-15 NOTE — Telephone Encounter (Signed)
Notified pt and she voices understanding. Pt wanted to know if she could increase meloxicam to twice a day due to her pain. Per verbal from PCP, stay on once daily dose and add tylenol as needed for her pain.

## 2015-09-15 NOTE — Progress Notes (Signed)
PCP: Nance Pear., NP  Subjective:   HPI: Patient is a 60 y.o. female here for bilateral wrist, right shoulder pain.  Patient reports she's had R > L bilateral wrist pain worsening over past several weeks. No known injury. Pain level is 6/10, sharp and radial side. Worse with all wrist motions, less so with thumb motions. Also having right anterior shoulder pain since her port was removed on right side of chest. Worse with motion of shoulder. No known injury. No swelling or bruising. No skin changes, numbness.  Past Medical History  Diagnosis Date  . GERD (gastroesophageal reflux disease)   . Hyperlipidemia   . History of seizure age 76    as a reaction to Penicillin - no seizures since  . Migraines   . Arthritis     hips and knees  . History of gastric ulcer     as a teenager  . UTI (lower urinary tract infection) 02/17/2014  . Fibromyalgia   . Asthma     daily inhaler, prn inhaler and neb.  Marland Kitchen History of thyroid cancer     s/p thyroidectomy  . Breast cancer ([facility]) 01/2014    left  . Family history of anesthesia complication     twin brother aspirated and died on OR table, per pt.  . Abnormally small mouth   . Dental bridge present     upper front and lower right  . Dental crowns present     x 3  . Hypothyroidism     Current Outpatient Prescriptions on File Prior to Visit  Medication Sig Dispense Refill  . albuterol (PROAIR HFA) 108 (90 Base) MCG/ACT inhaler INHALE 2 PUFFS INTO THE LUNGS EVERY 6 HOURS AS NEEDED 25.5 g 1  . albuterol (PROVENTIL) (5 MG/ML) 0.5% nebulizer solution Take 2.5 mg by nebulization every 6 (six) hours as needed for wheezing or shortness of breath.    . anastrozole (ARIMIDEX) 1 MG tablet Take 1 tablet (1 mg total) by mouth daily. 90 tablet 3  . Calcium Carbonate (CALCIUM 500 PO) Take 1 tablet by mouth daily.     . diphenhydrAMINE (BENADRYL) 25 MG tablet Take 1 tablet an hour before MRI.  Then take 1 tablet at bedtime as needed, per your  usual regimen. 30 tablet 0  . DULoxetine (CYMBALTA) 60 MG capsule Take 1 capsule (60 mg total) by mouth daily. 90 capsule 1  . EPINEPHrine 0.3 mg/0.3 mL IJ SOAJ injection Inject 0.3 mLs (0.3 mg total) into the muscle once. 2 Device 0  . Ergocalciferol (VITAMIN D2) 400 UNITS TABS Take 1 tablet by mouth daily.    . Fluticasone-Salmeterol (ADVAIR DISKUS) 250-50 MCG/DOSE AEPB Inhale 1 puff into the lungs 2 (two) times daily. 180 each 1  . Investigational palbociclib (IBRANCE) 125 MG capsule [facility] AFT-05 [name] Take 1 capsule (125 mg total) by mouth daily. Take with food. Swallow whole. Do not chew. Take on days 1-21. Repeat every 28 days. 69 capsule 0  . levothyroxine (SYNTHROID, LEVOTHROID) 175 MCG tablet Take 1 tablet (175 mcg total) by mouth daily. Take an additional half tablet =87.63mcg on Monday and Friday (total 262.49mcg) 102 tablet 1  . meclizine (ANTIVERT) 50 MG tablet Take 0.5 tablets (25 mg total) by mouth 3 (three) times daily as needed for dizziness or nausea. 21 tablet 0  . meloxicam (MOBIC) 7.5 MG tablet Take 1 tablet (7.5 mg total) by mouth daily. 30 tablet 0  . Multiple Vitamin (MULTIVITAMIN) tablet Take 1 tablet by mouth  daily.      . neomycin-polymyxin-hydrocortisone (CORTISPORIN) otic solution Place 3 drops into the left ear 4 (four) times daily. 10 mL 0  . nitroGLYCERIN (NITROSTAT) 0.4 MG SL tablet Place 1 tablet (0.4 mg total) under the tongue every 5 (five) minutes as needed for chest pain. 30 tablet 0  . Omega-3 Fatty Acids (FISH OIL) 1200 MG CAPS Take 1 capsule by mouth daily.      Marland Kitchen oxybutynin (DITROPAN-XL) 5 MG 24 hr tablet Take 1 tablet (5 mg total) by mouth daily. 90 tablet 1  . simvastatin (ZOCOR) 40 MG tablet Take 1 tablet (40 mg total) by mouth daily. 90 tablet 1   No current facility-administered medications on file prior to visit.    Past Surgical History  Procedure Laterality Date  . Cholecystectomy  1990  . Breast surgery  2011    left breast biposy   . Appendectomy  2004  . Tonsillectomy and adenoidectomy  2000  . Abdominal hysterectomy  2004    complete  . Thyroidectomy  2000  . Colonoscopy w/ polypectomy  06/2009  . Tumor excision      from thoracic spine  . Incontinence surgery  2004  . Achilles tendon repair Right   . Orif toe fracture Right     great toe  . Knee arthroscopy Bilateral     x 6 each knee  . Ligament repair Right     thumb/wrist  . Eye surgery Right 2013    exc. warts from underneath eyelid  . Breast lumpectomy with needle localization and axillary sentinel lymph node bx Left 02/23/2014    Procedure: BREAST LUMPECTOMY WITH NEEDLE LOCALIZATION AND AXILLARY SENTINEL LYMPH NODE BIOPSY;  Surgeon: Excell Seltzer, MD;  Location: [facility];  Service: General;  Laterality: Left;  . Portacath placement N/A 03/24/2014    Procedure: INSERTION PORT-A-CATH;  Surgeon: Excell Seltzer, MD;  Location: [facility];  Service: General;  Laterality: N/A;    Allergies  Allergen Reactions  . Bee Venom Anaphylaxis  . Contrast Media [Iodinated Diagnostic Agents] Shortness Of Breath  . Iodine Other (See Comments)    CARDIAC ARREST  . Latex Anaphylaxis and Rash  . Penicillins Shortness Of Breath, Rash and Other (See Comments)    SEIZURE  . Shellfish Allergy Shortness Of Breath  . Aspirin Rash and Other (See Comments)    GI UPSET  . Erythromycin Swelling and Rash    SWELLING OF JOINTS  . Lidocaine Swelling    SWELLING OF MOUTH AND THROAT  . Symbicort [Budesonide-Formoterol Fumarate] Other (See Comments)    BURNING OF TONGUE AND LIPS  . Codeine Rash  . Compazine [Prochlorperazine Maleate] Rash    Rash on face,chest, arms, back  . Pentazocine Lactate Rash  . Sulfonamide Derivatives Rash    Social History   Social History  . Marital Status: Married    Spouse Name: N/A  . Number of Children: N/A  . 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 student    Social History Main  Topics  . Smoking status: Never Smoker   . Smokeless tobacco: Never Used  . Alcohol Use: No  . Drug Use: No  . Sexual Activity: Not on file     Comment: menarche age 18, P 2, first birth age 3, menopause age 57, Premarin x 10 yrs   Other Topics Concern  . Not on file   Social History Narrative   Regular exercise: yes  Family History  Problem Relation Age of Onset  . Alcohol abuse Mother   . Arthritis Mother   . Hypertension Mother   . Bipolar disorder Mother   . Breast cancer Mother 65    unconfirmed  . Lung cancer Mother 59    smoker  . Alcohol abuse Father   . Cancer Father     lung  . Hyperlipidemia Father   . Kidney disease Father   . Diabetes Father   . Arthritis Maternal Grandmother   . Diabetes Paternal Grandmother   . Thyroid cancer Sister 63    type?; currently 36  . Other Sister     ovarian tumor @ 31; TAH/BSO  . Thyroid cancer Brother     dx 63s; currently 54  . Breast cancer Maternal Aunt     dx 100s; deceased 59  . Thyroid cancer Paternal Aunt     All 3 paternal aunts with thyroid ca in 30s/40s  . Lung cancer Paternal Aunt     2 of 3 paternal aunts with lung cancer    BP 123/80 mmHg  Pulse 76  Ht 5\' 8"  (1.727 m)  Wt 263 lb (119.296 kg)  BMI 40.00 kg/m2  Review of Systems: See HPI above.    Objective:  Physical Exam:  Gen: NAD, comfortable in exam room  Right shoulder: No swelling, ecchymoses.  No gross deformity. TTP lateral aspect of pectoralis only.  No other tenderness. FROM with pain on resisted pec flys, simulated pushup motion. Negative Hawkins, Neers. Negative Speeds, Yergasons. Strength 5/5 with empty can and resisted internal/external rotation. Negative apprehension. NV intact distally.  Bilateral wrists: No gross deformity, swelling, bruising. TTP 1st dorsal compartment bilaterally.  No 1st CMC, carpal tunnel, other tenderness. FROM wrists and thumbs. + finkelsteins. Negative tinels and phalens. NVI  distally.    Assessment & Plan:  1. Right shoulder pain - 2/2 severe pec major spasms/strain.  NSAIDs with ice/heat as needed.  Consider physical therapy for this - she would like to wait on this.  Shown home exercises to do daily with very light weight.  2. Bilateral dequervains tenosynovitis - icing, thumb spica braces.  Bilateral cortisone injections given as well.  F/u in 1 month.  After informed written consent patient was seated on exam table.  Ultrasound used to identify 1st dorsal compartment.  Area overlying this prepped with alcohol swab then right 1st dorsal compartment of the wrist injected with 0.5:0.70mL marcaine: depomedrol.  Patient tolerated procedure well without immediate complications.  After informed written consent patient was seated on exam table.  Ultrasound used to identify 1st dorsal compartment.  Area overlying this prepped with alcohol swab then left 1st dorsal compartment of the wrist injected with 0.5:0.28mL marcaine: depomedrol.  Patient tolerated procedure well without immediate complications.

## 2015-09-15 NOTE — Telephone Encounter (Signed)
Lupus screen came back +. Sometimes can be a false positive.  I would like to refer her to rheumatology for further evaluation. This could be contributing to her joint pain.

## 2015-09-16 ENCOUNTER — Telehealth: Payer: Self-pay | Admitting: Medical Oncology

## 2015-09-16 NOTE — Telephone Encounter (Signed)
Michelle Kennedy; Patient called me this morning (@ [phone]) to inform me that she was diagnosed yesterday with Lupus. Patient reports that she has had some pain to her right wrist for the past month and then last week her left wrist started to hurt and went to see her PCP. Patient reports to have had cortisone shots to bilateral wrists on 4/25. Patient wanted to know how this diagnosis affects her being on study. I informed patient that I would inform Dr. Lindi Adie and review with him and study and would contact her as soon as I know anything.   1400: I reviewed with Dr. Lindi Adie patient's diagnosis and per MD, he does not feel the Lupus diagnosis is related to patient being on palbociclib. Will follow up with study and call patient back with information.  Adele Dan, RN, BSN Clinical Research 09/16/2015 2:14 PM

## 2015-09-16 NOTE — Telephone Encounter (Signed)
[name] I informed patient that I spoke to Dr. Lindi Adie regarding her Lupus dx and that I am still waiting to here from study regarding her concern. I told patient I will call her as soon as I here back from study. Patient gave verbal understanding.

## 2015-09-27 ENCOUNTER — Telehealth: Payer: Self-pay | Admitting: Medical Oncology

## 2015-09-27 NOTE — Telephone Encounter (Signed)
[name]: LVMOM (2nd) with patient informing her of information regarding her recent lupus dx and her concerns with being on study with [name]. Informed patient that there is no conflict with dx and patient being on study, per study and per Dr. Lindi Adie. Asked patient to call and report any new medications if started, as well as to return call.  Adele Dan, RN, BSN Clinical Research 09/27/2015 4:23 PM

## 2015-09-28 ENCOUNTER — Telehealth: Payer: Self-pay | Admitting: Medical Oncology

## 2015-09-28 NOTE — Telephone Encounter (Signed)
[name]: Return call from patient regarding messages I have left r/t patient's recent diagnosis of Lupus. Patient reports she is doing well and keeping busy. States she will be seeing a Rheumatologist on 05/16 for further follow-up. Patient reports experiencing low grade temps that occur after eating, and reports that temperatures have not been higher than 99.8. Patient also reports aches to bilat wrists and upper arms and per her PCP, this is a symptom related to Lupus. I informed patient that for [name] study, this diagnosis does not conflict with being in study or current treatment of Palbociclib. Patient expressed understanding. I asked patient to call Dr. Lindi Adie or myself should she start any new medications. All patient's questions answered to her satisfaction. I thanked patient for returning my call and encourage her to contact Dr. Lindi Adie or myself with any questions or concerns she may have.  Patient scheduled to return to see Dr. Lindi Adie and for cycle 12.

## 2015-10-11 DIAGNOSIS — C73 Malignant neoplasm of thyroid gland: Secondary | ICD-10-CM | POA: Insufficient documentation

## 2015-10-11 DIAGNOSIS — I10 Essential (primary) hypertension: Secondary | ICD-10-CM | POA: Insufficient documentation

## 2015-10-21 ENCOUNTER — Other Ambulatory Visit: Payer: Self-pay | Admitting: Medical Oncology

## 2015-10-21 DIAGNOSIS — C50412 Malignant neoplasm of upper-outer quadrant of left female breast: Secondary | ICD-10-CM

## 2015-10-25 ENCOUNTER — Other Ambulatory Visit (HOSPITAL_BASED_OUTPATIENT_CLINIC_OR_DEPARTMENT_OTHER): Payer: 59

## 2015-10-25 ENCOUNTER — Ambulatory Visit (HOSPITAL_BASED_OUTPATIENT_CLINIC_OR_DEPARTMENT_OTHER): Payer: 59 | Admitting: Nurse Practitioner

## 2015-10-25 ENCOUNTER — Telehealth: Payer: Self-pay | Admitting: Nurse Practitioner

## 2015-10-25 ENCOUNTER — Encounter: Payer: Self-pay | Admitting: Nurse Practitioner

## 2015-10-25 ENCOUNTER — Encounter: Payer: Self-pay | Admitting: Medical Oncology

## 2015-10-25 VITALS — BP 164/96 | HR 87 | Temp 98.4°F | Resp 19 | Ht 68.0 in | Wt 265.5 lb

## 2015-10-25 DIAGNOSIS — C50412 Malignant neoplasm of upper-outer quadrant of left female breast: Secondary | ICD-10-CM

## 2015-10-25 DIAGNOSIS — R5383 Other fatigue: Secondary | ICD-10-CM

## 2015-10-25 DIAGNOSIS — Z006 Encounter for examination for normal comparison and control in clinical research program: Secondary | ICD-10-CM

## 2015-10-25 DIAGNOSIS — I10 Essential (primary) hypertension: Secondary | ICD-10-CM | POA: Diagnosis not present

## 2015-10-25 LAB — CBC WITH DIFFERENTIAL/PLATELET
BASO%: 2.6 % — ABNORMAL HIGH (ref 0.0–2.0)
Basophils Absolute: 0.1 10*3/uL (ref 0.0–0.1)
EOS%: 2 % (ref 0.0–7.0)
Eosinophils Absolute: 0.1 10*3/uL (ref 0.0–0.5)
HCT: 38.5 % (ref 34.8–46.6)
HGB: 12.5 g/dL (ref 11.6–15.9)
LYMPH%: 32.2 % (ref 14.0–49.7)
MCH: 27.3 pg (ref 25.1–34.0)
MCHC: 32.4 g/dL (ref 31.5–36.0)
MCV: 84.5 fL (ref 79.5–101.0)
MONO#: 0.4 10*3/uL (ref 0.1–0.9)
MONO%: 13.6 % (ref 0.0–14.0)
NEUT#: 1.3 10*3/uL — ABNORMAL LOW (ref 1.5–6.5)
NEUT%: 49.6 % (ref 38.4–76.8)
Platelets: 221 10*3/uL (ref 145–400)
RBC: 4.56 10*6/uL (ref 3.70–5.45)
RDW: 17.5 % — ABNORMAL HIGH (ref 11.2–14.5)
WBC: 2.7 10*3/uL — ABNORMAL LOW (ref 3.9–10.3)
lymph#: 0.9 10*3/uL (ref 0.9–3.3)

## 2015-10-25 LAB — COMPREHENSIVE METABOLIC PANEL
ALT: 22 U/L (ref 0–55)
AST: 16 U/L (ref 5–34)
Albumin: 3.5 g/dL (ref 3.5–5.0)
Alkaline Phosphatase: 58 U/L (ref 40–150)
Anion Gap: 7 mEq/L (ref 3–11)
BILIRUBIN TOTAL: 0.43 mg/dL (ref 0.20–1.20)
BUN: 16.3 mg/dL (ref 7.0–26.0)
CHLORIDE: 107 meq/L (ref 98–109)
CO2: 27 meq/L (ref 22–29)
CREATININE: 0.9 mg/dL (ref 0.6–1.1)
Calcium: 9.4 mg/dL (ref 8.4–10.4)
EGFR: 74 mL/min/{1.73_m2} — ABNORMAL LOW (ref >90)
GLUCOSE: 89 mg/dL (ref 70–140)
Potassium: 4.1 mEq/L (ref 3.5–5.1)
SODIUM: 141 meq/L (ref 136–145)
TOTAL PROTEIN: 6.4 g/dL (ref 6.4–8.3)

## 2015-10-25 MED ORDER — INV-PALBOCICLIB 125 MG CAPS #23 ALLIANCE FOUNDATION AFT-05 (PALLAS): 125.0000 mg | ORAL_CAPSULE | Freq: Every day | ORAL | Status: DC

## 2015-10-25 NOTE — Telephone Encounter (Signed)
appt made and avs printed °

## 2015-10-25 NOTE — Progress Notes (Signed)
[name]: Cycle 12 (13 & 14) Patient here today for labs and office visit for start of Cycle 12 (13 & 14) of on-study Palbociclib. Patient here alone today, I met with patient after her lab appointmend in exam room. Patient completed study required PRO questionnaires, given to her by research assistant Remer Macho, prior to patient's lab appointment. V/S completed and B/P retaken by this nurse after patient had some time to sit, 154/85. Patient reports no new medications. Patient denies having any new side effects. Patient did report being diagnosed with Lupus in May and this was originally documented at the time of initial reporting. Patient denies any pain, skin rashes, or bowel & bladder concerns, denies any nausea or vomiting, and reports no headaches. Patient does report having the occasional hot flashes and mild fatigue. Also, patient reports no changes to baseline neuropathy. Patient states that she has been swimming in the mornings for exercise. Patient has returned empty study dispensed medication bottles from cycle 9, 10 and 11 as well as medication diaries for all three cycles and has documented no missed days of palbociclib and her anti-hormone medication, Arimidex. Patients neutrophils resulted today @ 1.3 (grade 2).  Per study this is within treatment levels and no dose reduction required. Per NP Nira Conn Boelter's physical assessment of patient and review of labs, as well as NCS labs, patient cleared to continue with current dosage of 125 mg Palbociclib. Dr. Lindi Adie reviewed and would like for patient to return for labs prior to start of Palbociclib for Cycle 13. Study generated medication bottle assignments provided to PharmD Romualdo Bolk for cycle 12, 13 and 14. Patient was dispensed study medication Palbociclib for next 3 cycles as well as medication diaries for both Palbociclib and anti-hormone documentation. All patient's questions answered to her satisfaction. Patient knows that Dr. Lindi Adie would  like for her to return for labs in one months time. Patient thanked for her continued support of study and encouraged to call Dr. Lindi Adie or myself with any questions or concerns. Adele Dan, RN, BSN Clinical Research 10/25/2015 11:47 AM

## 2015-10-25 NOTE — Progress Notes (Signed)
Patient Care Team: Debbrah Alar, NP as PCP - General (Internal Medicine) Excell Seltzer, MD as Consulting Physician (General Surgery) Nicholas Lose, MD as Consulting Physician (Hematology and Oncology) [name], MD as Attending Physician (Radiation Oncology) Katheran James., MD (Endocrinology)  DIAGNOSIS: Breast cancer of upper-outer quadrant of left female breast [facility])   Staging form: Breast, AJCC 7th Edition     Clinical: Stage IIB (T3, N0, cM0) - Unsigned       Staging comments: Staged at breast conference 12/31/13.      Pathologic: Stage IIB (T2, N1a, cM0) - Signed by Rulon Eisenmenger, MD on 03/10/2014   SUMMARY OF ONCOLOGIC HISTORY:   Breast cancer of upper-outer quadrant of left female breast ([place])   12/22/2013 Mammogram Left breast upper-outer quadrant 1.8 x 1.4 x 1.6 cm mass with left axillary lymph node measuring 3.6 mm   12/30/2013 Breast MRI Dominant enhancing left upper outer quadrant irregular mass encompassing a confluent area of abnormal clumped nodular enhancement, 5.5 cm overall, 6 mm masslike enhancement central left breast     02/23/2014 Surgery Left lumpectomy: Invasive ductal carcinoma grade 2 spending 3.8 cm intermediate grade DCIS with lymphovascular invasion margins negative one out of 4 SLN positive, ER 99%, PR 100%, HER-2 negative ratio 0.74, T2, N1, M0 stage IIB   03/19/2014 PET scan No evidence of distant metastatic disease   03/31/2014 - 08/18/2014 Chemotherapy Adjuvant chemotherapy with dose dense Adriamycin Cytoxan x4 followed by Abraxane weekly x12   08/28/2014 Imaging Left brachial vein thrombus   09/11/2014 -  Radiation Therapy Adjuvant radiation therapy   11/20/2014 -  Anti-estrogen oral therapy Anastrozole 1 mg daily + Ibrance ([name] clinical trial)    CHIEF COMPLIANT: [name]  Clinical trial with Ibrance and anastrozole  INTERVAL HISTORY: Michelle Kennedy is a  60 year old with above-mentioned left breast cancer who underwent lumpectomy and  adjuvant chemotherapy followed by radiation. She is currently on antiestrogen therapy with anastrozole in combination with Ibrance on [name] Clinical trial. Today is the start of cycle 12. She tolerates both drugs well with few issues. She does complain of mild fatigue, vaginal dryness, and hot flashes. The interval history is remarkable for being diagnosed with lupus. She is not on any treatment for this currently. She is told that she is in remission. Her "butterfly" facial rash is clearing.   REVIEW OF SYSTEMS:   Constitutional: Denies fevers, chills or abnormal weight loss Eyes: Denies blurriness of vision Ears, nose, mouth, throat, and face: Denies mucositis or sore throat Respiratory: Denies cough, dyspnea or wheezes Cardiovascular: Denies palpitation, chest discomfort Gastrointestinal:  Denies nausea, heartburn or change in bowel habits Skin: (+) clearing butterfly facial rash Lymphatics: Denies new lymphadenopathy or easy bruising Neurological:Denies numbness, tingling or new weaknesses Behavioral/Psych: Mood is stable, no new changes  Extremities: (+) left upper extremity lymphedema. No lower extremity edema Breast:   Intermittent sharp discomfort along the surgical scar All other systems were reviewed with the patient and are negative.  I have reviewed the past medical history, past surgical history, social history and family history with the patient and they are unchanged from previous note.  ALLERGIES:  is allergic to bee venom; contrast media; iodine; latex; penicillins; shellfish allergy; aspirin; erythromycin; lidocaine; symbicort; codeine; compazine; pentazocine lactate; and sulfonamide derivatives.  MEDICATIONS:  Current Outpatient Prescriptions  Medication Sig Dispense Refill  . albuterol (PROAIR HFA) 108 (90 Base) MCG/ACT inhaler INHALE 2 PUFFS INTO THE LUNGS EVERY 6 HOURS AS NEEDED 25.5 g 1  .  albuterol (PROVENTIL) (5 MG/ML) 0.5% nebulizer solution Take 2.5 mg by  nebulization every 6 (six) hours as needed for wheezing or shortness of breath.    . anastrozole (ARIMIDEX) 1 MG tablet Take 1 tablet (1 mg total) by mouth daily. 90 tablet 3  . Calcium Carbonate (CALCIUM 500 PO) Take 1 tablet by mouth daily.     . diphenhydrAMINE (BENADRYL) 25 MG tablet Take 1 tablet an hour before MRI.  Then take 1 tablet at bedtime as needed, per your usual regimen. 30 tablet 0  . DULoxetine (CYMBALTA) 60 MG capsule Take 1 capsule (60 mg total) by mouth daily. 90 capsule 1  . EPINEPHrine 0.3 mg/0.3 mL IJ SOAJ injection Inject 0.3 mLs (0.3 mg total) into the muscle once. 2 Device 0  . Ergocalciferol (VITAMIN D2) 400 UNITS TABS Take 1 tablet by mouth daily.    . Fluticasone-Salmeterol (ADVAIR DISKUS) 250-50 MCG/DOSE AEPB Inhale 1 puff into the lungs 2 (two) times daily. 180 each 1  . Investigational palbociclib (IBRANCE) 125 MG capsule [facility] AFT-05 [name] Take 1 capsule (125 mg total) by mouth daily. Take with food. Swallow whole. Do not chew. Take on days 1-21. Repeat every 28 days. 69 capsule 0  . levothyroxine (SYNTHROID, LEVOTHROID) 175 MCG tablet Take 1 tablet (175 mcg total) by mouth daily. Take an additional half tablet =87.51mg on Monday and Friday (total 262.554m) 102 tablet 1  . meclizine (ANTIVERT) 50 MG tablet Take 0.5 tablets (25 mg total) by mouth 3 (three) times daily as needed for dizziness or nausea. 21 tablet 0  . meloxicam (MOBIC) 7.5 MG tablet Take 1 tablet (7.5 mg total) by mouth daily. 30 tablet 0  . Multiple Vitamin (MULTIVITAMIN) tablet Take 1 tablet by mouth daily.      . Marland Kitcheneomycin-polymyxin-hydrocortisone (CORTISPORIN) otic solution Place 3 drops into the left ear 4 (four) times daily. 10 mL 0  . nitroGLYCERIN (NITROSTAT) 0.4 MG SL tablet Place 1 tablet (0.4 mg total) under the tongue every 5 (five) minutes as needed for chest pain. 30 tablet 0  . Omega-3 Fatty Acids (FISH OIL) 1200 MG CAPS Take 1 capsule by mouth daily.      . Marland Kitchenxybutynin  (DITROPAN-XL) 5 MG 24 hr tablet Take 1 tablet (5 mg total) by mouth daily. 90 tablet 1  . simvastatin (ZOCOR) 40 MG tablet Take 1 tablet (40 mg total) by mouth daily. 90 tablet 1   No current facility-administered medications for this visit.    PHYSICAL EXAMINATION: ECOG PERFORMANCE STATUS: 0 - Asymptomatic  Filed Vitals:   10/25/15 0911  BP: 164/96  Pulse: 87  Temp: 98.4 F (36.9 C)  Resp: 19   Filed Weights   10/25/15 0911  Weight: 265 lb 8 oz (120.43 kg)   BREASTS: Left breast status post lumpectomy and radiation. No evidence of recurrent disease. Left axilla benign. Right breast unremarkable. GENERAL:alert, no distress and comfortable SKIN: skin color, texture, turgor are normal, no rashes or significant lesions EYES: normal, Conjunctiva are pink and non-injected, sclera clear OROPHARYNX:no exudate, no erythema and lips, buccal mucosa, and tongue normal  NECK: supple, thyroid normal size, non-tender, without nodularity LYMPH:  no palpable lymphadenopathy in the cervical, axillary or inguinal LUNGS: clear to auscultation and percussion with normal breathing effort HEART: regular rate & rhythm and no murmurs and no lower extremity edema ABDOMEN:abdomen soft, non-tender and normal bowel sounds MUSCULOSKELETAL:no cyanosis of digits and no clubbing, grade 1 left upper extremity lymphedema NEURO: alert & oriented x 3  with fluent speech, no focal motor/sensory deficits EXTREMITIES: No lower extremity edema  LABORATORY DATA:  I have reviewed the data as listed   Chemistry      Component Value Date/Time   NA 141 10/25/2015 0857   NA 138 09/16/2014 1140   K 4.1 10/25/2015 0857   K 3.8 09/16/2014 1140   CL 103 09/16/2014 1140   CO2 27 10/25/2015 0857   CO2 27 09/16/2014 1140   BUN 16.3 10/25/2015 0857   BUN 16 09/16/2014 1140   CREATININE 0.9 10/25/2015 0857   CREATININE 0.75 09/16/2014 1140   CREATININE 0.83 05/31/2011 0905      Component Value Date/Time   CALCIUM 9.4  10/25/2015 0857   CALCIUM 8.8 09/16/2014 1140   ALKPHOS 58 10/25/2015 0857   ALKPHOS 52 08/11/2013 0918   AST 16 10/25/2015 0857   AST 19 08/11/2013 0918   ALT 22 10/25/2015 0857   ALT 24 08/11/2013 0918   BILITOT 0.43 10/25/2015 0857   BILITOT 0.4 08/11/2013 0918       Lab Results  Component Value Date   WBC 2.7* 10/25/2015   HGB 12.5 10/25/2015   HCT 38.5 10/25/2015   MCV 84.5 10/25/2015   PLT 221 10/25/2015   NEUTROABS 1.3* 10/25/2015     ASSESSMENT & PLAN:  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 M0 stage IIB Adjuvant chemotherapy Completed 4 cycles of dose dense Adriamycin and Cytoxan. 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 Grade 2 neutropenia: Did not require dose reduction, close monitoring of blood counts, ANC 1.3 on 10/25/15 2. Otherwise no major side effects to Ibrance 3. 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  Hypertension: BP 150s/80s. Never taken antihypertensive agents. Advised to avoid salt, eat clean diet, and exercise regularly. Will discuss with PCP at next visit.  Survivorship: Patient is now swimming and exercising every day. Is staying active.    Breast Cancer  Surveillance: 1. Breast exam 05/10/2015: Normal except for scar tissue related to prior surgery and radiation 2. Mammogram January 2017  Category B breast density.  No mammographic abnormalities postsurgical changes. Patient plans to go to [place] in June 2017  RTC3 months for labs and follow-up   No orders of the defined types were placed in this encounter.   The patient has a good understanding of the overall plan. she agrees with it. she will call with any problems that may develop before the next visit here.   Laurie Panda, NP 10/25/2015

## 2015-10-26 LAB — HEMOGLOBIN A1C
Est. average glucose Bld gHb Est-mCnc: 123 mg/dL
HEMOGLOBIN A1C: 5.9 % — AB (ref 4.8–5.6)

## 2015-11-01 ENCOUNTER — Other Ambulatory Visit: Payer: 59

## 2015-11-01 ENCOUNTER — Ambulatory Visit: Payer: 59 | Admitting: Hematology and Oncology

## 2015-11-18 ENCOUNTER — Telehealth: Payer: Self-pay | Admitting: Family

## 2015-11-18 NOTE — Telephone Encounter (Addendum)
Caller name: Miram from [employer]  Relation to pt: Call back Sparkman:  Reason for call:  Mirama from the [facility] research department  inquiring about what type of lupus Daryn Ridener mrn # [phone]

## 2015-11-19 NOTE — Telephone Encounter (Signed)
Unable to find Lupus type in notes. Called patient to see if she has been seen by Specialist . Left message for call back.

## 2015-11-22 ENCOUNTER — Other Ambulatory Visit: Payer: 59

## 2015-11-22 ENCOUNTER — Other Ambulatory Visit: Payer: Self-pay

## 2015-11-22 DIAGNOSIS — C50412 Malignant neoplasm of upper-outer quadrant of left female breast: Secondary | ICD-10-CM

## 2015-11-24 ENCOUNTER — Other Ambulatory Visit (HOSPITAL_BASED_OUTPATIENT_CLINIC_OR_DEPARTMENT_OTHER): Payer: 59

## 2015-11-24 ENCOUNTER — Telehealth: Payer: Self-pay | Admitting: Medical Oncology

## 2015-11-24 DIAGNOSIS — C50412 Malignant neoplasm of upper-outer quadrant of left female breast: Secondary | ICD-10-CM

## 2015-11-24 LAB — CBC WITH DIFFERENTIAL/PLATELET
BASO%: 1.7 % (ref 0.0–2.0)
BASOS ABS: 0 10*3/uL (ref 0.0–0.1)
EOS ABS: 0.1 10*3/uL (ref 0.0–0.5)
EOS%: 2.7 % (ref 0.0–7.0)
HCT: 40.4 % (ref 34.8–46.6)
HEMOGLOBIN: 13 g/dL (ref 11.6–15.9)
LYMPH%: 42.3 % (ref 14.0–49.7)
MCH: 27.2 pg (ref 25.1–34.0)
MCHC: 32.2 g/dL (ref 31.5–36.0)
MCV: 84.5 fL (ref 79.5–101.0)
MONO#: 0.2 10*3/uL (ref 0.1–0.9)
MONO%: 8.6 % (ref 0.0–14.0)
NEUT%: 44.7 % (ref 38.4–76.8)
NEUTROS ABS: 1 10*3/uL — AB (ref 1.5–6.5)
Platelets: 205 10*3/uL (ref 145–400)
RBC: 4.78 10*6/uL (ref 3.70–5.45)
RDW: 16.8 % — AB (ref 11.2–14.5)
WBC: 2.2 10*3/uL — AB (ref 3.9–10.3)
lymph#: 0.9 10*3/uL (ref 0.9–3.3)

## 2015-11-24 LAB — COMPREHENSIVE METABOLIC PANEL
ALT: 20 U/L (ref 0–55)
AST: 17 U/L (ref 5–34)
Albumin: 3.6 g/dL (ref 3.5–5.0)
Alkaline Phosphatase: 63 U/L (ref 40–150)
Anion Gap: 8 mEq/L (ref 3–11)
BUN: 15 mg/dL (ref 7.0–26.0)
CO2: 25 meq/L (ref 22–29)
Calcium: 9.4 mg/dL (ref 8.4–10.4)
Chloride: 107 mEq/L (ref 98–109)
Creatinine: 1 mg/dL (ref 0.6–1.1)
EGFR: 64 mL/min/{1.73_m2} — AB (ref >90)
GLUCOSE: 92 mg/dL (ref 70–140)
POTASSIUM: 4.7 meq/L (ref 3.5–5.1)
SODIUM: 139 meq/L (ref 136–145)
Total Bilirubin: 0.35 mg/dL (ref 0.20–1.20)
Total Protein: 6.6 g/dL (ref 6.4–8.3)

## 2015-11-24 NOTE — Telephone Encounter (Signed)
Michelle Kennedy Patient in this morning for labs. MD ordered CBC and CMet for prior to start of cycle #13 (not a study requirement for this cycle). Labs resulted with [place] @ 1.0 and within treatment levels. Reviewed lab results with Dr. Lindi Adie and per MD, patient informed to continue with Cycle 13. Patient was informed of lab results and reminded of neutropenic precautions. Patient gave verbal understanding. Inquired with patient of any new AE's, patient reports having increased fatigue and she doesn't understand why. Patient states she swims and walks, but does get fatigued by the early afternoon. Inquired with patient of Lupus diagnosis and specific type. Per patient she saw Dr. Marquita Palms and was told that she did not have Lupus and he would like to see her in 6 months for re-check. Patient inquired as to whether she should have another lab prior to start of cycle 14, informed patient will check with Dr. Lindi Adie and call her back with info. I thanked patient for her time and asked her to contact Dr. Lindi Adie or myself with any concerns or questions.  Adele Dan, RN, BSN Clinical Research 11/24/2015 1:36 PM

## 2015-11-24 NOTE — Telephone Encounter (Signed)
Left detailed message on Miriam's voicemail re: below pt response.  [place], [place]     Phone Number: [phone]            Caller name:Desiree D Axtell  Relation to PO:[id number]  Call back number: [phone]    Reason for call:  Rheumatologist advised patient doesn't have lupus and will recheck in 6 month.

## 2015-11-26 ENCOUNTER — Ambulatory Visit (INDEPENDENT_AMBULATORY_CARE_PROVIDER_SITE_OTHER): Payer: 59 | Admitting: Family Medicine

## 2015-11-26 ENCOUNTER — Encounter: Payer: Self-pay | Admitting: Family Medicine

## 2015-11-26 VITALS — BP 164/90 | Ht 67.5 in | Wt 264.0 lb

## 2015-11-26 DIAGNOSIS — S86011A Strain of right Achilles tendon, initial encounter: Secondary | ICD-10-CM

## 2015-11-26 NOTE — Patient Instructions (Signed)
You strained your achilles tendon. Mobic 15 mg daily with food for pain and inflammation. Do simple motion exercises of your ankle. Advance to doing these with theraband against resistance then finally calf raises Can add heel walks, toe walks forward and backward as well when tolerated Ice bucket 10-15 minutes at end of day - can ice 3-4 times a day. Avoid uneven ground, hills as much as possible. Heel lifts in shoes or shoes with a natural heel lift until pain has resolved. Consider physical therapy, orthotics, nitro patches if not improving as expected. Follow up in 3-4 weeks.

## 2015-11-29 ENCOUNTER — Telehealth: Payer: Self-pay | Admitting: Family

## 2015-11-29 MED ORDER — OXYBUTYNIN CHLORIDE ER 5 MG PO TB24: 5.0000 mg | ORAL_TABLET | Freq: Every day | ORAL | Status: DC

## 2015-11-29 NOTE — Telephone Encounter (Signed)
Please advise 

## 2015-11-29 NOTE — Telephone Encounter (Signed)
°  Relationship to patient: Self Can be reached: [phone]  Pharmacy:  [id number] - [place], [place] [phone] (Phone) [phone] (Fax)         Reason for call: Request refill on oxybutynin (DITROPAN-XL) 5 MG 24 hr tablet [id number]   Patient states that the dr. Parks Ranger prescribed it is no longer her doctor

## 2015-11-30 ENCOUNTER — Encounter: Payer: Self-pay | Admitting: Family Medicine

## 2015-11-30 DIAGNOSIS — S86011A Strain of right Achilles tendon, initial encounter: Secondary | ICD-10-CM | POA: Insufficient documentation

## 2015-11-30 NOTE — Assessment & Plan Note (Signed)
no tears noted on ultrasound.  Exam reassuring.  Continue with mobic.  Heel lifts, icing.  Shown home exercises to do and how to advance these as tolerated.  Consider physical therapy, orthotics, nitro patches if not improving.  F/u in 3-4 weeks.

## 2015-11-30 NOTE — Progress Notes (Signed)
PCP: Nance Pear., NP  Subjective:   HPI: Patient is a 60 y.o. female here for right foot injury.  Patient reports 1 week ago she was walking in the woods with family when she tripped over a log and fell to her right side. Able to bear weight after this. Pain however felt posterior right foot/heel. Initial swelling here as well Has been icing and taking mobic. Pain improved with rest, still worse with ambulation. No skin changes, numbness. Pain is dull.  Past Medical History  Diagnosis Date  . GERD (gastroesophageal reflux disease)   . Hyperlipidemia   . History of seizure age 55    as a reaction to Penicillin - no seizures since  . Migraines   . Arthritis     hips and knees  . History of gastric ulcer     as a teenager  . UTI (lower urinary tract infection) 02/17/2014  . Fibromyalgia   . Asthma     daily inhaler, prn inhaler and neb.  Marland Kitchen History of thyroid cancer     s/p thyroidectomy  . Breast cancer ([place]) 01/2014    left  . Family history of anesthesia complication     twin brother aspirated and died on OR table, per pt.  . Abnormally small mouth   . Dental bridge present     upper front and lower right  . Dental crowns present     x 3  . Hypothyroidism     Current Outpatient Prescriptions on File Prior to Visit  Medication Sig Dispense Refill  . albuterol (PROAIR HFA) 108 (90 Base) MCG/ACT inhaler INHALE 2 PUFFS INTO THE LUNGS EVERY 6 HOURS AS NEEDED 25.5 g 1  . albuterol (PROVENTIL) (5 MG/ML) 0.5% nebulizer solution Take 2.5 mg by nebulization every 6 (six) hours as needed for wheezing or shortness of breath.    . anastrozole (ARIMIDEX) 1 MG tablet Take 1 tablet (1 mg total) by mouth daily. 90 tablet 3  . Calcium Carbonate (CALCIUM 500 PO) Take 1 tablet by mouth daily.     . diphenhydrAMINE (BENADRYL) 25 MG tablet Take 1 tablet an hour before MRI.  Then take 1 tablet at bedtime as needed, per your usual regimen. 30 tablet 0  . DULoxetine (CYMBALTA) 60 MG  capsule Take 1 capsule (60 mg total) by mouth daily. 90 capsule 1  . EPINEPHrine 0.3 mg/0.3 mL IJ SOAJ injection Inject 0.3 mLs (0.3 mg total) into the muscle once. 2 Device 0  . Ergocalciferol (VITAMIN D2) 400 UNITS TABS Take 1 tablet by mouth daily.    . Fluticasone-Salmeterol (ADVAIR DISKUS) 250-50 MCG/DOSE AEPB Inhale 1 puff into the lungs 2 (two) times daily. 180 each 1  . Investigational palbociclib (IBRANCE) 125 MG capsule [facility] AFT-05 [name] Take 1 capsule (125 mg total) by mouth daily. Take with food. Swallow whole. Do not chew. Take on days 1-21. Repeat every 28 days. 69 capsule 0  . levothyroxine (SYNTHROID, LEVOTHROID) 175 MCG tablet Take 1 tablet (175 mcg total) by mouth daily. Take an additional half tablet =87.75mcg on Monday and Friday (total 262.70mcg) 102 tablet 1  . meclizine (ANTIVERT) 50 MG tablet Take 0.5 tablets (25 mg total) by mouth 3 (three) times daily as needed for dizziness or nausea. 21 tablet 0  . meloxicam (MOBIC) 7.5 MG tablet Take 1 tablet (7.5 mg total) by mouth daily. 30 tablet 0  . Multiple Vitamin (MULTIVITAMIN) tablet Take 1 tablet by mouth daily.      Marland Kitchen  neomycin-polymyxin-hydrocortisone (CORTISPORIN) otic solution Place 3 drops into the left ear 4 (four) times daily. 10 mL 0  . nitroGLYCERIN (NITROSTAT) 0.4 MG SL tablet Place 1 tablet (0.4 mg total) under the tongue every 5 (five) minutes as needed for chest pain. 30 tablet 0  . Omega-3 Fatty Acids (FISH OIL) 1200 MG CAPS Take 1 capsule by mouth daily.      . simvastatin (ZOCOR) 40 MG tablet Take 1 tablet (40 mg total) by mouth daily. 90 tablet 1   No current facility-administered medications on file prior to visit.    Past Surgical History  Procedure Laterality Date  . Cholecystectomy  1990  . Breast surgery  2011    left breast biposy  . Appendectomy  2004  . Tonsillectomy and adenoidectomy  2000  . Abdominal hysterectomy  2004    complete  . Thyroidectomy  2000  . Colonoscopy w/  polypectomy  06/2009  . Tumor excision      from thoracic spine  . Incontinence surgery  2004  . Achilles tendon repair Right   . Orif toe fracture Right     great toe  . Knee arthroscopy Bilateral     x 6 each knee  . Ligament repair Right     thumb/wrist  . Eye surgery Right 2013    exc. warts from underneath eyelid  . Breast lumpectomy with needle localization and axillary sentinel lymph node bx Left 02/23/2014    Procedure: BREAST LUMPECTOMY WITH NEEDLE LOCALIZATION AND AXILLARY SENTINEL LYMPH NODE BIOPSY;  Surgeon: Excell Seltzer, MD;  Location: [facility];  Service: General;  Laterality: Left;  . Portacath placement N/A 03/24/2014    Procedure: INSERTION PORT-A-CATH;  Surgeon: Excell Seltzer, MD;  Location: [facility];  Service: General;  Laterality: N/A;    Allergies  Allergen Reactions  . Bee Venom Anaphylaxis  . Contrast Media [Iodinated Diagnostic Agents] Shortness Of Breath  . Iodine Other (See Comments)    CARDIAC ARREST  . Latex Anaphylaxis and Rash  . Penicillins Shortness Of Breath, Rash and Other (See Comments)    SEIZURE  . Shellfish Allergy Shortness Of Breath  . Aspirin Rash and Other (See Comments)    GI UPSET  . Erythromycin Swelling and Rash    SWELLING OF JOINTS  . Lidocaine Swelling    SWELLING OF MOUTH AND THROAT  . Symbicort [Budesonide-Formoterol Fumarate] Other (See Comments)    BURNING OF TONGUE AND LIPS  . Codeine Rash  . Compazine [Prochlorperazine Maleate] Rash    Rash on face,chest, arms, back  . Pentazocine Lactate Rash  . Sulfonamide Derivatives Rash    Social History   Social History  . Marital Status: Married    Spouse Name: N/A  . Number of Children: N/A  . 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 Smoking status: Never Smoker   . Smokeless tobacco: Never Used  . Alcohol Use: No  . Drug Use: No  . Sexual Activity: Not on file     Comment:  menarche age 67, P 2, first birth age 31, menopause age 54, Premarin x 10 yrs   Other Topics Concern  . Not on file   Social History Narrative   Regular exercise: yes          Family History  Problem Relation Age of Onset  . Alcohol abuse Mother   . Arthritis Mother   . Hypertension  Mother   . Bipolar disorder Mother   . Breast cancer Mother 52    unconfirmed  . Lung cancer Mother 54    smoker  . Alcohol abuse Father   . Cancer Father     lung  . Hyperlipidemia Father   . Kidney disease Father   . Diabetes Father   . Arthritis Maternal Grandmother   . Diabetes Paternal Grandmother   . Thyroid cancer Sister 49    type?; currently 52  . Other Sister     ovarian tumor @ 63; TAH/BSO  . Thyroid cancer Brother     dx 13s; currently 45  . Breast cancer Maternal Aunt     dx 71s; deceased 51  . Thyroid cancer Paternal Aunt     All 3 paternal aunts with thyroid ca in 30s/40s  . Lung cancer Paternal Aunt     2 of 3 paternal aunts with lung cancer    BP 164/90 mmHg  Ht 5' 7.5" (1.715 m)  Wt 264 lb (119.75 kg)  BMI 40.71 kg/m2  Review of Systems: See HPI above.    Objective:  Physical Exam:  Gen: NAD, comfortable in exam room  Right foot/ankle: No gross deformity, swelling, ecchymoses FROM TTP achilles at insertion on calcaneus posteriorly Negative ant drawer and talar tilt.   Negative syndesmotic compression. Negative calcaneal squeeze. Thompsons test negative. NV intact distally.  Left foot/ankle: FROM without pain.    Assessment & Plan:  1. Right achilles strain - no tears noted on ultrasound.  Exam reassuring.  Continue with mobic.  Heel lifts, icing.  Shown home exercises to do and how to advance these as tolerated.  Consider physical therapy, orthotics, nitro patches if not improving.  F/u in 3-4 weeks.

## 2015-12-03 ENCOUNTER — Other Ambulatory Visit: Payer: Self-pay | Admitting: Medical Oncology

## 2015-12-03 ENCOUNTER — Telehealth: Payer: Self-pay | Admitting: Medical Oncology

## 2015-12-03 DIAGNOSIS — C50412 Malignant neoplasm of upper-outer quadrant of left female breast: Secondary | ICD-10-CM

## 2015-12-03 NOTE — Telephone Encounter (Signed)
[name] Informed patient to expect call from schedulers for lab appt on 7/28 or 7/31st prior to start of cycle 14 for recheck of labs. Patient gave verbal understanding. Patient reports to be doing well and states she has just returned from a trip to [place]. Patient denies any fevers or any new or worsening symptoms or concerns. Patient encouraged to contact clinic, Dr. Lindi Adie or myself with any questions or concerns.

## 2015-12-07 ENCOUNTER — Telehealth: Payer: Self-pay | Admitting: Hematology and Oncology

## 2015-12-07 NOTE — Telephone Encounter (Signed)
Called patient to verify appointment. No voice mail available. Appointment letter and schedule mailed. Michelle Kennedy.

## 2015-12-17 ENCOUNTER — Encounter: Payer: Self-pay | Admitting: Family

## 2015-12-17 MED ORDER — OXYBUTYNIN CHLORIDE ER 10 MG PO TB24: 10.0000 mg | ORAL_TABLET | Freq: Every day | ORAL | 1 refills | Status: DC

## 2015-12-19 ENCOUNTER — Other Ambulatory Visit: Payer: Self-pay | Admitting: Hematology and Oncology

## 2015-12-20 ENCOUNTER — Encounter: Payer: Self-pay | Admitting: Medical Oncology

## 2015-12-20 ENCOUNTER — Other Ambulatory Visit (HOSPITAL_BASED_OUTPATIENT_CLINIC_OR_DEPARTMENT_OTHER): Payer: 59

## 2015-12-20 DIAGNOSIS — Z006 Encounter for examination for normal comparison and control in clinical research program: Secondary | ICD-10-CM

## 2015-12-20 DIAGNOSIS — C50412 Malignant neoplasm of upper-outer quadrant of left female breast: Secondary | ICD-10-CM

## 2015-12-20 LAB — CBC WITH DIFFERENTIAL/PLATELET
BASO%: 2.2 % — ABNORMAL HIGH (ref 0.0–2.0)
BASOS ABS: 0.1 10*3/uL (ref 0.0–0.1)
EOS ABS: 0.1 10*3/uL (ref 0.0–0.5)
EOS%: 2 % (ref 0.0–7.0)
HEMATOCRIT: 40.4 % (ref 34.8–46.6)
HGB: 12.9 g/dL (ref 11.6–15.9)
LYMPH#: 0.9 10*3/uL (ref 0.9–3.3)
LYMPH%: 31 % (ref 14.0–49.7)
MCH: 27.4 pg (ref 25.1–34.0)
MCHC: 31.9 g/dL (ref 31.5–36.0)
MCV: 85.9 fL (ref 79.5–101.0)
MONO#: 0.3 10*3/uL (ref 0.1–0.9)
MONO%: 10.7 % (ref 0.0–14.0)
NEUT#: 1.6 10*3/uL (ref 1.5–6.5)
NEUT%: 54.1 % (ref 38.4–76.8)
PLATELETS: 211 10*3/uL (ref 145–400)
RBC: 4.71 10*6/uL (ref 3.70–5.45)
RDW: 16.7 % — ABNORMAL HIGH (ref 11.2–14.5)
WBC: 3 10*3/uL — ABNORMAL LOW (ref 3.9–10.3)

## 2015-12-20 NOTE — Progress Notes (Signed)
[name] I met with patient today prior to her lab appointment. Today was not a study required visit and patient had CBC to check [place] due to decreased [place] on 7/5 and prior to start of cycle 14. Patient reports has returned from visit to [place] and had a fall out there when walking in a wooded area. Patient reports to have tripped over a log and bruised area just above her right knee, confirms that it is mostly healed. Patient reports no other A/E's,  issues or concerns at this time. I informed patient that I will call her with lab results after reviewing them with Dr. Lindi Adie. Thanked patient for coming in. Adele Dan, RN, BSN Clinical Research 12/20/2015 11:30 AM  CBC resulted and reviewed with Dr. Lindi Adie. Per MD, patient to continue with current dosage. Patient called and informed of lab results, [place] within normal levels and informed to continue with current dosage of palbociclib. Patient gave verbal understanding, denies further questions at this time and was encouraged to call Dr. Lindi Adie or myself with any questions or concerns she may have. Confirmed August 28th appointment. Adele Dan, RN, BSN Clinical Research 12/20/2015 2:30 PM

## 2015-12-27 ENCOUNTER — Other Ambulatory Visit: Payer: Self-pay | Admitting: Family

## 2015-12-27 ENCOUNTER — Other Ambulatory Visit: Payer: Self-pay | Admitting: Family Medicine

## 2015-12-27 NOTE — Telephone Encounter (Signed)
Melissa-- please advise 90 day supply of meloxicam to mail order.

## 2015-12-27 NOTE — Telephone Encounter (Signed)
Cymbalta refill sent to mail order. Pt last seen by PCP 08/2015 and has no future appts scheduled. Please advise when pt should follow up in the office.

## 2015-12-27 NOTE — Telephone Encounter (Signed)
Needs Oct follow up please.

## 2015-12-28 NOTE — Telephone Encounter (Signed)
My chart message sent to pt.

## 2016-01-02 ENCOUNTER — Other Ambulatory Visit: Payer: Self-pay | Admitting: Family

## 2016-01-17 ENCOUNTER — Other Ambulatory Visit (HOSPITAL_BASED_OUTPATIENT_CLINIC_OR_DEPARTMENT_OTHER): Payer: 59

## 2016-01-17 ENCOUNTER — Encounter: Payer: Self-pay | Admitting: Medical Oncology

## 2016-01-17 ENCOUNTER — Ambulatory Visit (INDEPENDENT_AMBULATORY_CARE_PROVIDER_SITE_OTHER): Payer: 59 | Admitting: Family

## 2016-01-17 ENCOUNTER — Encounter: Payer: Self-pay | Admitting: Family

## 2016-01-17 ENCOUNTER — Ambulatory Visit (HOSPITAL_BASED_OUTPATIENT_CLINIC_OR_DEPARTMENT_OTHER): Payer: 59 | Admitting: Hematology and Oncology

## 2016-01-17 ENCOUNTER — Encounter: Payer: Self-pay | Admitting: Hematology and Oncology

## 2016-01-17 ENCOUNTER — Ambulatory Visit: Payer: 59 | Admitting: Family Medicine

## 2016-01-17 VITALS — BP 134/78 | HR 84 | Temp 98.0°F | Resp 18 | Ht 67.0 in | Wt 269.4 lb

## 2016-01-17 DIAGNOSIS — I890 Lymphedema, not elsewhere classified: Secondary | ICD-10-CM | POA: Diagnosis not present

## 2016-01-17 DIAGNOSIS — F329 Major depressive disorder, single episode, unspecified: Secondary | ICD-10-CM

## 2016-01-17 DIAGNOSIS — Z006 Encounter for examination for normal comparison and control in clinical research program: Secondary | ICD-10-CM

## 2016-01-17 DIAGNOSIS — C50412 Malignant neoplasm of upper-outer quadrant of left female breast: Secondary | ICD-10-CM

## 2016-01-17 DIAGNOSIS — Z79811 Long term (current) use of aromatase inhibitors: Secondary | ICD-10-CM | POA: Diagnosis not present

## 2016-01-17 DIAGNOSIS — E785 Hyperlipidemia, unspecified: Secondary | ICD-10-CM | POA: Diagnosis not present

## 2016-01-17 DIAGNOSIS — G4710 Hypersomnia, unspecified: Secondary | ICD-10-CM | POA: Diagnosis not present

## 2016-01-17 DIAGNOSIS — J4520 Mild intermittent asthma, uncomplicated: Secondary | ICD-10-CM | POA: Diagnosis not present

## 2016-01-17 DIAGNOSIS — R400 Somnolence: Secondary | ICD-10-CM

## 2016-01-17 DIAGNOSIS — Z0000 Encounter for general adult medical examination without abnormal findings: Secondary | ICD-10-CM | POA: Diagnosis not present

## 2016-01-17 DIAGNOSIS — F32A Depression, unspecified: Secondary | ICD-10-CM

## 2016-01-17 LAB — LIPID PANEL
CHOLESTEROL: 177 mg/dL (ref 0–200)
HDL: 56.3 mg/dL (ref >39.00)
LDL Cholesterol: 88 mg/dL (ref 0–99)
NonHDL: 120.71
Total CHOL/HDL Ratio: 3
Triglycerides: 165 mg/dL — ABNORMAL HIGH (ref 0.0–149.0)
VLDL: 33 mg/dL (ref 0.0–40.0)

## 2016-01-17 LAB — CBC WITH DIFFERENTIAL/PLATELET
BASO%: 1.4 % (ref 0.0–2.0)
BASOS ABS: 0.1 10*3/uL (ref 0.0–0.1)
EOS%: 2.6 % (ref 0.0–7.0)
Eosinophils Absolute: 0.1 10*3/uL (ref 0.0–0.5)
HCT: 39.9 % (ref 34.8–46.6)
HEMOGLOBIN: 13 g/dL (ref 11.6–15.9)
LYMPH#: 1.1 10*3/uL (ref 0.9–3.3)
LYMPH%: 25.2 % (ref 14.0–49.7)
MCH: 27.5 pg (ref 25.1–34.0)
MCHC: 32.6 g/dL (ref 31.5–36.0)
MCV: 84.5 fL (ref 79.5–101.0)
MONO#: 0.6 10*3/uL (ref 0.1–0.9)
MONO%: 14.4 % — AB (ref 0.0–14.0)
NEUT#: 2.4 10*3/uL (ref 1.5–6.5)
NEUT%: 56.4 % (ref 38.4–76.8)
Platelets: 238 10*3/uL (ref 145–400)
RBC: 4.72 10*6/uL (ref 3.70–5.45)
RDW: 16.1 % — ABNORMAL HIGH (ref 11.2–14.5)
WBC: 4.2 10*3/uL (ref 3.9–10.3)

## 2016-01-17 LAB — COMPREHENSIVE METABOLIC PANEL
ALBUMIN: 3.5 g/dL (ref 3.5–5.0)
ALK PHOS: 63 U/L (ref 40–150)
ALT: 59 U/L — ABNORMAL HIGH (ref 0–55)
AST: 37 U/L — ABNORMAL HIGH (ref 5–34)
Anion Gap: 9 mEq/L (ref 3–11)
BUN: 13.4 mg/dL (ref 7.0–26.0)
CALCIUM: 9.8 mg/dL (ref 8.4–10.4)
CO2: 26 mEq/L (ref 22–29)
Chloride: 106 mEq/L (ref 98–109)
Creatinine: 0.8 mg/dL (ref 0.6–1.1)
EGFR: 77 mL/min/{1.73_m2} — AB (ref >90)
GLUCOSE: 97 mg/dL (ref 70–140)
POTASSIUM: 4.5 meq/L (ref 3.5–5.1)
SODIUM: 142 meq/L (ref 136–145)
Total Bilirubin: 0.34 mg/dL (ref 0.20–1.20)
Total Protein: 6.5 g/dL (ref 6.4–8.3)

## 2016-01-17 LAB — HIV ANTIBODY (ROUTINE TESTING W REFLEX): HIV: NONREACTIVE

## 2016-01-17 MED ORDER — INV-PALBOCICLIB 125 MG CAPS #23 ALLIANCE FOUNDATION AFT-05 (PALLAS): 125.0000 mg | ORAL_CAPSULE | Freq: Every day | ORAL | 0 refills | Status: DC

## 2016-01-17 NOTE — Progress Notes (Signed)
Pre visit review using our clinic review tool, if applicable. No additional management support is needed unless otherwise documented below in the visit note. 

## 2016-01-17 NOTE — Assessment & Plan Note (Signed)
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 M0 stage IIB Adjuvant chemotherapy Completed 4 cycles of dose dense Adriamycin and Cytoxan. 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 Grade 2 neutropenia: Did not require dose reduction, close monitoring of blood counts, ANC1.5 on 08/02/15 2. Otherwise no major side effects to Ibrance 3. 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  Survivorship: Patient is now swimming and exercising every day. Is staying active.  In spite of this she has gained 10 pounds weight. I discussed with her about enrolling in clinical trial for weight loss in breast cancer survivors.  Breast Cancer Surveillance: 1. Breast exam 05/10/2015: Normal except for scar tissue related to prior surgery and radiation 2. Mammogram January 2017  Category B breast density.  No mammographic abnormalities postsurgical changes. Patient plans to go to [place] in June 2017  RTC3 months for labs and follow-up

## 2016-01-17 NOTE — Progress Notes (Signed)
[name]: Cycle 15/Day 1 (C16 & C17) Patient here today for start of Cycle 15 with labs and MD visit. I met with patient prior to her appointment with Dr. Lindi Adie. V/S completed and patient's B/P elevated today, MD informed. Patient returned her cycle 12, 13 and 14 medication diaries and study dispensed palbociclib bottles with no (0) pills remaining. Empty palbociclib returned bottles given to Henreitta Leber, PharmD for documentation. Patient confirms no difficulty with self-dispensing of medication and per medication diaries, no missed days recorded. Patient denies any new side effects or worsening symptoms. Patient voiced concern with weight gain despite exercise and healthy eating. Patient denies any new medications. Per Dr. Geralyn Flash physical assessment of patient and review of all labs, including NCS ones, patient to proceed with start of Cycle 15 and to continue with current dose of 125 mg. Study generated Palbociclib assignment provided to PharmD Henreitta Leber for documentation and dispensing of 125 mg palbociclib. This nurse provided patient with medication diaries and bottle ID#'s [id number], [id number], [id number] for cycle 15, 16 and 17, patient knows to start with cycle 15 today.  All patients questions answered to her satisfaction and patient encouraged to contact Dr. Lindi Adie or myself with any questions and concerns. Patient to return 11/20 for Cycle 18, with labs and MD visit.   Study with a new consent form, reviewed changes with patient and patient aware that the only change to this consent form, Version 2.2, dated 12/13/15, is the [facility] address on page 1. After all patient's questions answered to her satisfaction, patient proceeded to initial and date each page and sign where indicated on consent form. Patient was provided with a copy of the signed Version 2.2 for her records. Patient was also informed that the study has an upcoming new consent form in the near future so that she knows to expect  another.   Adele Dan, RN, BSN Clinical Research 01/17/2016 1:34 PM

## 2016-01-17 NOTE — Progress Notes (Signed)
Subjective:    Patient ID: Michelle Kennedy, female    DOB: 02-16-1956, 60 y.o.   MRN: [id number]  HPI  HTN- has gained some weight. Thinks that this is cause for recent increase in her BP.   BP Readings from Last 3 Encounters:  01/17/16 134/78  01/17/16 (!) 154/90  11/26/15 (!) 164/90   Asthma- stable, no recent symptoms.   She reports that she stays tired during the day. Fit bit shows very restless. Denies hx of snoring.    Hypothyroid- following with Endo, reports synthroid was decreased.    Hyperlipidemia- On simvastatin, denies myalgia.  Lab Results  Component Value Date   CHOL 182 12/01/2014   HDL 50.00 12/01/2014   LDLCALC 102 (H) 12/01/2014   LDLDIRECT 102.0 07/08/2014   TRIG 148.0 12/01/2014   CHOLHDL 4 12/01/2014   Depression- stable on cymbalta.  Swimming daily.  This helps her mood.    Review of Systems See HPI  Past Medical History:  Diagnosis Date  . Abnormally small mouth   . Arthritis    hips and knees  . Asthma    daily inhaler, prn inhaler and neb.  . Breast cancer ([place]) 01/2014   left  . Dental bridge present    upper front and lower right  . Dental crowns present    x 3  . Family history of anesthesia complication    twin brother aspirated and died on OR table, per pt.  . Fibromyalgia   . GERD (gastroesophageal reflux disease)   . History of gastric ulcer    as a teenager  . History of seizure age 66   as a reaction to Penicillin - no seizures since  . History of thyroid cancer    s/p thyroidectomy  . Hyperlipidemia   . Hypothyroidism   . Migraines   . UTI (lower urinary tract infection) 02/17/2014     Social History   Social History  . Marital status: Married    Spouse name: N/A  . Number of children: N/A  . Years of education: N/A   Occupational History  . student Unemployed   Social History Main Topics  . Smoking status: Never Smoker  . Smokeless tobacco: Never Used  . Alcohol use No  . Drug use: No  . Sexual  activity: Not on file     Comment: menarche age 31, P 2, first birth age 21, menopause age 60, Premarin x 10 yrs   Other Topics Concern  . Not on file   Social History Narrative   Regular exercise: yes          Past Surgical History:  Procedure Laterality Date  . ABDOMINAL HYSTERECTOMY  2004   complete  . ACHILLES TENDON REPAIR Right   . APPENDECTOMY  2004  . BREAST LUMPECTOMY WITH NEEDLE LOCALIZATION AND AXILLARY SENTINEL LYMPH NODE BX Left 02/23/2014   Procedure: BREAST LUMPECTOMY WITH NEEDLE LOCALIZATION AND AXILLARY SENTINEL LYMPH NODE BIOPSY;  Surgeon: Excell Seltzer, MD;  Location: [facility];  Service: General;  Laterality: Left;  . BREAST SURGERY  2011   left breast biposy  . CHOLECYSTECTOMY  1990  . COLONOSCOPY W/ POLYPECTOMY  06/2009  . EYE SURGERY Right 2013   exc. warts from underneath eyelid  . INCONTINENCE SURGERY  2004  . KNEE ARTHROSCOPY Bilateral    x 6 each knee  . LIGAMENT REPAIR Right    thumb/wrist  . ORIF TOE FRACTURE Right    great toe  .  PORTACATH PLACEMENT N/A 03/24/2014   Procedure: INSERTION PORT-A-CATH;  Surgeon: Excell Seltzer, MD;  Location: [facility];  Service: General;  Laterality: N/A;  . THYROIDECTOMY  2000  . TONSILLECTOMY AND ADENOIDECTOMY  2000  . TUMOR EXCISION     from thoracic spine    Family History  Problem Relation Age of Onset  . Alcohol abuse Mother   . Arthritis Mother   . Hypertension Mother   . Bipolar disorder Mother   . Breast cancer Mother 33    unconfirmed  . Lung cancer Mother 82    smoker  . Alcohol abuse Father   . Cancer Father     lung  . Hyperlipidemia Father   . Kidney disease Father   . Diabetes Father   . Arthritis Maternal Grandmother   . Diabetes Paternal Grandmother   . Thyroid cancer Sister 56    type?; currently 11  . Other Sister     ovarian tumor @ 87; TAH/BSO  . Thyroid cancer Brother     dx 86s; currently 68  . Breast cancer Maternal Aunt     dx 47s;  deceased 19  . Thyroid cancer Paternal Aunt     All 3 paternal aunts with thyroid ca in 30s/40s  . Lung cancer Paternal Aunt     2 of 3 paternal aunts with lung cancer    Allergies  Allergen Reactions  . Bee Venom Anaphylaxis  . Contrast Media [Iodinated Diagnostic Agents] Shortness Of Breath  . Iodine Other (See Comments)    CARDIAC ARREST  . Latex Anaphylaxis and Rash  . Penicillins Shortness Of Breath, Rash and Other (See Comments)    SEIZURE  . Shellfish Allergy Shortness Of Breath  . Aspirin Rash and Other (See Comments)    GI UPSET  . Erythromycin Swelling and Rash    SWELLING OF JOINTS  . Lidocaine Swelling    SWELLING OF MOUTH AND THROAT  . Symbicort [Budesonide-Formoterol Fumarate] Other (See Comments)    BURNING OF TONGUE AND LIPS  . Codeine Rash  . Compazine [Prochlorperazine Maleate] Rash    Rash on face,chest, arms, back  . Pentazocine Lactate Rash  . Sulfonamide Derivatives Rash    Current Outpatient Prescriptions on File Prior to Visit  Medication Sig Dispense Refill  . albuterol (PROAIR HFA) 108 (90 Base) MCG/ACT inhaler INHALE 2 PUFFS INTO THE LUNGS EVERY 6 HOURS AS NEEDED 25.5 g 1  . albuterol (PROVENTIL) (5 MG/ML) 0.5% nebulizer solution Take 2.5 mg by nebulization every 6 (six) hours as needed for wheezing or shortness of breath.    . anastrozole (ARIMIDEX) 1 MG tablet Take 1 tablet by mouth  daily 90 tablet 3  . Calcium Carbonate (CALCIUM 500 PO) Take 1 tablet by mouth daily.     . diphenhydrAMINE (BENADRYL) 25 MG tablet Take 1 tablet an hour before MRI.  Then take 1 tablet at bedtime as needed, per your usual regimen. 30 tablet 0  . DULoxetine (CYMBALTA) 60 MG capsule Take 1 capsule by mouth  daily 90 capsule 1  . EPINEPHrine 0.3 mg/0.3 mL IJ SOAJ injection Inject 0.3 mLs (0.3 mg total) into the muscle once. 2 Device 0  . Ergocalciferol (VITAMIN D2) 400 UNITS TABS Take 1 tablet by mouth daily.    . Fluticasone-Salmeterol (ADVAIR DISKUS) 250-50 MCG/DOSE  AEPB Inhale 1 puff into the lungs 2 (two) times daily. 180 each 1  . levothyroxine (SYNTHROID, LEVOTHROID) 175 MCG tablet Take 1 tablet (175 mcg total) by  mouth daily. Take an additional half tablet =87.31mcg on Monday and Friday (total 262.88mcg) (Patient taking differently: Take 175 mcg by mouth daily. ) 102 tablet 1  . meclizine (ANTIVERT) 50 MG tablet Take 0.5 tablets (25 mg total) by mouth 3 (three) times daily as needed for dizziness or nausea. 21 tablet 0  . meloxicam (MOBIC) 7.5 MG tablet Take 1 tablet by mouth  daily 90 tablet 0  . Multiple Vitamin (MULTIVITAMIN) tablet Take 1 tablet by mouth daily.      Marland Kitchen neomycin-polymyxin-hydrocortisone (CORTISPORIN) otic solution Place 3 drops into the left ear 4 (four) times daily. 10 mL 0  . nitroGLYCERIN (NITROSTAT) 0.4 MG SL tablet Place 1 tablet (0.4 mg total) under the tongue every 5 (five) minutes as needed for chest pain. 30 tablet 0  . Omega-3 Fatty Acids (FISH OIL) 1200 MG CAPS Take 1 capsule by mouth daily.      Marland Kitchen oxybutynin (DITROPAN XL) 10 MG 24 hr tablet Take 1 tablet (10 mg total) by mouth at bedtime. 90 tablet 1  . simvastatin (ZOCOR) 40 MG tablet Take 1 tablet by mouth  daily 90 tablet 0   No current facility-administered medications on file prior to visit.     BP 134/78 (BP Location: Right Arm, Cuff Size: Large)   Pulse 84   Temp 98 F (36.7 C) (Oral)   Resp 18   Ht 5\' 7"  (1.702 m)   Wt 269 lb 6.4 oz (122.2 kg)   SpO2 98% Comment: room air  BMI 42.19 kg/m       Objective:   Physical Exam  Constitutional: She appears well-developed and well-nourished.  Cardiovascular: Normal rate, regular rhythm and normal heart sounds.   No murmur heard. Pulmonary/Chest: Effort normal and breath sounds normal. No respiratory distress. She has no wheezes.  Psychiatric: She has a normal mood and affect. Her behavior is normal. Judgment and thought content normal.          Assessment & Plan:  Daytime somnolence- will order home sleep  study to rule out OSA.

## 2016-01-17 NOTE — Progress Notes (Signed)
Patient Care Team: Debbrah Alar, NP as PCP - General (Internal Medicine) Excell Seltzer, MD as Consulting Physician (General Surgery) Nicholas Lose, MD as Consulting Physician (Hematology and Oncology) [name], MD as Attending Physician (Radiation Oncology) Katheran James., MD (Endocrinology)  DIAGNOSIS: Breast cancer of upper-outer quadrant of left female breast [facility])   Staging form: Breast, AJCC 7th Edition   - Clinical: Stage IIB (T3, N0, cM0) - Unsigned         Staging comments: Staged at breast conference 12/31/13.    - Pathologic: Stage IIB (T2, N1a, cM0) - Signed by Rulon Eisenmenger, MD on 03/10/2014  SUMMARY OF ONCOLOGIC HISTORY:   Breast cancer of upper-outer quadrant of left female breast ([place])   12/22/2013 Mammogram    Left breast upper-outer quadrant 1.8 x 1.4 x 1.6 cm mass with left axillary lymph node measuring 3.6 mm      12/30/2013 Breast MRI    Dominant enhancing left upper outer quadrant irregular mass encompassing a confluent area of abnormal clumped nodular enhancement, 5.5 cm overall, 6 mm masslike enhancement central left breast        02/23/2014 Surgery    Left lumpectomy: Invasive ductal carcinoma grade 2 spending 3.8 cm intermediate grade DCIS with lymphovascular invasion margins negative one out of 4 SLN positive, ER 99%, PR 100%, HER-2 negative ratio 0.74, T2, N1, M0 stage IIB      03/19/2014 PET scan    No evidence of distant metastatic disease      03/31/2014 - 08/18/2014 Chemotherapy    Adjuvant chemotherapy with dose dense Adriamycin Cytoxan x4 followed by Abraxane weekly x12      08/28/2014 Imaging    Left brachial vein thrombus      09/11/2014 -  Radiation Therapy    Adjuvant radiation therapy      11/20/2014 -  Anti-estrogen oral therapy    Anastrozole 1 mg daily + Ibrance ([name] clinical trial)       CHIEF COMPLIANT: Follow-up on Ibrance with anastrozole  INTERVAL HISTORY: Michelle BISAILLON is a 60 year old with  above-mentioned history of left breast cancer currently on antiestrogen therapy with anastrozole. She also randomized to [facility] on clinical trial. She is tolerating Ibrance extremely well. She does not have any significant fatigue nausea vomiting or any gastrointestinal problems. She does have very occasional hot flashes especially when she stops Ibrance.  REVIEW OF SYSTEMS:   Constitutional: Denies fevers, chills or abnormal weight loss Eyes: Denies blurriness of vision Ears, nose, mouth, throat, and face: Denies mucositis or sore throat Respiratory: Denies cough, dyspnea or wheezes Cardiovascular: Denies palpitation, chest discomfort Gastrointestinal:  Denies nausea, heartburn or change in bowel habits Skin: Denies abnormal skin rashes Lymphatics: Denies new lymphadenopathy or easy bruising Neurological:Denies numbness, tingling or new weaknesses Behavioral/Psych: Mood is stable, no new changes  Extremities: No lower extremity edema Breast: denies any pain or lumps or nodules in either breasts All other systems were reviewed with the patient and are negative.  I have reviewed the past medical history, past surgical history, social history and family history with the patient and they are unchanged from previous note.  ALLERGIES:  is allergic to bee venom; contrast media [iodinated diagnostic agents]; iodine; latex; penicillins; shellfish allergy; aspirin; erythromycin; lidocaine; symbicort [budesonide-formoterol fumarate]; codeine; compazine [prochlorperazine maleate]; pentazocine lactate; and sulfonamide derivatives.  MEDICATIONS:  Current Outpatient Prescriptions  Medication Sig Dispense Refill  . albuterol (PROAIR HFA) 108 (90 Base) MCG/ACT inhaler INHALE 2 PUFFS INTO THE LUNGS EVERY  6 HOURS AS NEEDED 25.5 g 1  . albuterol (PROVENTIL) (5 MG/ML) 0.5% nebulizer solution Take 2.5 mg by nebulization every 6 (six) hours as needed for wheezing or shortness of breath.    . anastrozole  (ARIMIDEX) 1 MG tablet Take 1 tablet (1 mg total) by mouth daily. 90 tablet 3  . anastrozole (ARIMIDEX) 1 MG tablet     . anastrozole (ARIMIDEX) 1 MG tablet Take 1 tablet by mouth  daily 90 tablet 3  . Calcium Carbonate (CALCIUM 500 PO) Take 1 tablet by mouth daily.     . diphenhydrAMINE (BENADRYL) 25 MG tablet Take 1 tablet an hour before MRI.  Then take 1 tablet at bedtime as needed, per your usual regimen. 30 tablet 0  . DULoxetine (CYMBALTA) 60 MG capsule Take 1 capsule by mouth  daily 90 capsule 1  . EPINEPHrine 0.3 mg/0.3 mL IJ SOAJ injection Inject 0.3 mLs (0.3 mg total) into the muscle once. 2 Device 0  . Ergocalciferol (VITAMIN D2) 400 UNITS TABS Take 1 tablet by mouth daily.    . Fluticasone-Salmeterol (ADVAIR DISKUS) 250-50 MCG/DOSE AEPB Inhale 1 puff into the lungs 2 (two) times daily. 180 each 1  . Investigational palbociclib (IBRANCE) 125 MG capsule [facility] AFT-05 [name] Take 1 capsule (125 mg total) by mouth daily. Take with food. Swallow whole. Do not chew. Take on days 1-21. Repeat every 28 days. 69 capsule 0  . levothyroxine (SYNTHROID, LEVOTHROID) 125 MCG tablet     . levothyroxine (SYNTHROID, LEVOTHROID) 175 MCG tablet Take 1 tablet (175 mcg total) by mouth daily. Take an additional half tablet =87.27mg on Monday and Friday (total 262.510m) 102 tablet 1  . levothyroxine (SYNTHROID, LEVOTHROID) 175 MCG tablet     . meclizine (ANTIVERT) 50 MG tablet Take 0.5 tablets (25 mg total) by mouth 3 (three) times daily as needed for dizziness or nausea. 21 tablet 0  . meloxicam (MOBIC) 7.5 MG tablet Take 1 tablet by mouth  daily 90 tablet 0  . Multiple Vitamin (MULTIVITAMIN) tablet Take 1 tablet by mouth daily.      . Marland Kitcheneomycin-polymyxin-hydrocortisone (CORTISPORIN) otic solution Place 3 drops into the left ear 4 (four) times daily. 10 mL 0  . nitroGLYCERIN (NITROSTAT) 0.4 MG SL tablet Place 1 tablet (0.4 mg total) under the tongue every 5 (five) minutes as needed for chest pain.  30 tablet 0  . Omega-3 Fatty Acids (FISH OIL) 1200 MG CAPS Take 1 capsule by mouth daily.      . Marland Kitchenxybutynin (DITROPAN XL) 10 MG 24 hr tablet Take 1 tablet (10 mg total) by mouth at bedtime. 90 tablet 1  . simvastatin (ZOCOR) 40 MG tablet Take 1 tablet by mouth  daily 90 tablet 0   No current facility-administered medications for this visit.     PHYSICAL EXAMINATION: ECOG PERFORMANCE STATUS: 0 - Asymptomatic  Vitals:   01/17/16 1024  BP: (!) 154/90  Pulse: 66  Resp: 19  Temp: 98 F (36.7 C)   Filed Weights   01/17/16 1024  Weight: 269 lb 6.4 oz (122.2 kg)    GENERAL:alert, no distress and comfortable SKIN: skin color, texture, turgor are normal, no rashes or significant lesions EYES: normal, Conjunctiva are pink and non-injected, sclera clear OROPHARYNX:no exudate, no erythema and lips, buccal mucosa, and tongue normal  NECK: supple, thyroid normal size, non-tender, without nodularity LYMPH:  no palpable lymphadenopathy in the cervical, axillary or inguinal LUNGS: clear to auscultation and percussion with normal breathing effort HEART: regular  rate & rhythm and no murmurs and no lower extremity edema ABDOMEN:abdomen soft, non-tender and normal bowel sounds MUSCULOSKELETAL:no cyanosis of digits and no clubbing  NEURO: alert & oriented x 3 with fluent speech, no focal motor/sensory deficits EXTREMITIES: No lower extremity edema BREAST: No palpable masses or nodules in either right or left breasts. No palpable axillary supraclavicular or infraclavicular adenopathy no breast tenderness or nipple discharge. (exam performed in the presence of a chaperone)  LABORATORY DATA:  I have reviewed the data as listed   Chemistry      Component Value Date/Time   NA 139 11/24/2015 0952   K 4.7 11/24/2015 0952   CL 103 09/16/2014 1140   CO2 25 11/24/2015 0952   BUN 15.0 11/24/2015 0952   CREATININE 1.0 11/24/2015 0952      Component Value Date/Time   CALCIUM 9.4 11/24/2015 0952    ALKPHOS 63 11/24/2015 0952   AST 17 11/24/2015 0952   ALT 20 11/24/2015 0952   BILITOT 0.35 11/24/2015 0952       Lab Results  Component Value Date   WBC 4.2 01/17/2016   HGB 13.0 01/17/2016   HCT 39.9 01/17/2016   MCV 84.5 01/17/2016   PLT 238 01/17/2016   NEUTROABS 2.4 01/17/2016     ASSESSMENT & PLAN:  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 M0 stage IIB Adjuvant chemotherapy Completed 4 cycles of dose dense Adriamycin and Cytoxan. Followed by Abraxane X 12 completed 08/18/14, status post radiation completed June 2016, started anastrozole 1 mg daily 11/20/2014 (also enrolled on [name] clinical trial randomized to [facility])   Lymphedema left arm: Improved with physical therapy for lymphedema History of thyroid cancer: Low likelihood of recurrence  Current treatment: Adjuvant antiestrogen therapy with anastrozole once daily for 5 years started 11/20/2014 + Ibrance ([name] trial), today cycle 15 day 1 Ibrance toxicities: 1. Grade 2 neutropenia: Did not require dose reduction, close monitoring of blood counts, ANC 2.4 on 01/17/16 2. Otherwise no major side effects to Ibrance 3. 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  Survivorship: Patient is now swimming and exercising every day. Is staying active.  In spite of this she has gained 10 pounds weight.  Patient was provided with a consent for BWELL study   Breast Cancer Surveillance: 1. Breast exam: 01/17/2016 Normal except for scar tissue related to prior surgery and radiation 2. Mammogram January 2017  Category B breast density.  No mammographic abnormalities postsurgical changes.  RTC3 months for labs and follow-up  No orders of the defined types were placed in this  encounter.  The patient has a good understanding of the overall plan. she agrees with it. she will call with any problems that may develop before the next visit here.   Rulon Eisenmenger, MD 01/17/16

## 2016-01-17 NOTE — Patient Instructions (Signed)
Please complete lab work prior to leaving. You will be contacted about your home sleep study. Continue to work on diet/exercise/weight loss and low sodium diet.

## 2016-01-18 ENCOUNTER — Encounter: Payer: Self-pay | Admitting: Family Medicine

## 2016-01-18 ENCOUNTER — Ambulatory Visit (INDEPENDENT_AMBULATORY_CARE_PROVIDER_SITE_OTHER): Payer: 59 | Admitting: Family Medicine

## 2016-01-18 VITALS — BP 146/89 | HR 62 | Ht 68.0 in | Wt 269.0 lb

## 2016-01-18 DIAGNOSIS — M25571 Pain in right ankle and joints of right foot: Secondary | ICD-10-CM | POA: Diagnosis not present

## 2016-01-18 DIAGNOSIS — M6530 Trigger finger, unspecified finger: Secondary | ICD-10-CM

## 2016-01-18 LAB — HEPATITIS C ANTIBODY: HCV AB: NEGATIVE

## 2016-01-18 NOTE — Patient Instructions (Addendum)
You have a trigger thumb. You were given a cortisone injection today. Wear wrist brace as you have been for the next week then as needed. Heat may help with motion. Follow up with me in 1 month.  You have peroneal tendinitis. You were given an injection for this also. Icing 15 minutes at a time 3-4 times a day. Continue theraband exercise (especially external rotation) 3 sets of 10 once a day. Meloxicam, ibuprofen, or aleve if needed. Arch supports are very important. Avoid flat shoes, barefoot walking as much as possible. Consider physical therapy, nitro patches if not improving.

## 2016-01-18 NOTE — Assessment & Plan Note (Signed)
Stable on cymbalta, continue same.   

## 2016-01-18 NOTE — Assessment & Plan Note (Signed)
Stable on statin, continue same.  

## 2016-01-18 NOTE — Assessment & Plan Note (Signed)
Stable with prn albuterol.  

## 2016-01-19 ENCOUNTER — Encounter: Payer: Self-pay | Admitting: Family

## 2016-01-19 DIAGNOSIS — M25571 Pain in right ankle and joints of right foot: Secondary | ICD-10-CM

## 2016-01-19 DIAGNOSIS — M6530 Trigger finger, unspecified finger: Secondary | ICD-10-CM | POA: Diagnosis not present

## 2016-01-19 MED ORDER — METHYLPREDNISOLONE ACETATE 40 MG/ML IJ SUSP
20.0000 mg | Freq: Once | INTRAMUSCULAR | Status: AC
  Administered 2016-01-19: 20 mg via INTRA_ARTICULAR

## 2016-01-19 MED ORDER — METHYLPREDNISOLONE ACETATE 40 MG/ML IJ SUSP
40.0000 mg | Freq: Once | INTRAMUSCULAR | Status: AC
  Administered 2016-01-19: 40 mg via INTRA_ARTICULAR

## 2016-01-20 DIAGNOSIS — M25571 Pain in right ankle and joints of right foot: Secondary | ICD-10-CM | POA: Insufficient documentation

## 2016-01-20 NOTE — Assessment & Plan Note (Signed)
Left trigger thumb - injection given today.  Continue thumb spica brace.  Heat, mobic.  F/u in 1 month.  After informed written consent patient was seated on exam table.  Area overlying left 1st digit A1 pulley prepped with alcohol swab, injected with 0.5:0.61mL marcaine: depomedrol.  Patient tolerated procedure well without immediate complications.

## 2016-01-20 NOTE — Assessment & Plan Note (Signed)
achilles has improved - now with peroneal tendinopathy.  Discussed options - went ahead with ultrasound guided injection.  Icing, shown home exercises to do daily.  Arch supports.  Avoid flat shoes and barefoot walking.  Consider PT, nitro if not improving.  F/u in 1 month.  After informed written consent patient was seated on exam table.  Area overlying right peroneal tendons at level of ankle prepped with alcohol swab, injected sheaths of both with 1:24mL marcaine: depomedrol with ultrasound guidance.  Patient tolerated procedure well without immediate complications.

## 2016-01-20 NOTE — Progress Notes (Signed)
PCP: Nance Pear., NP  Subjective:   HPI: Patient is a 60 y.o. female here for right foot pain, left thumb pain.  7/7: Patient reports 1 week ago she was walking in the woods with family when she tripped over a log and fell to her right side. Able to bear weight after this. Pain however felt posterior right foot/heel. Initial swelling here as well Has been icing and taking mobic. Pain improved with rest, still worse with ambulation. No skin changes, numbness. Pain is dull.  8/29: Patient denies new injury.   Reports right ankle pain has moved and is now lateral about distal ankle and foot. Worse with turning foot inwards. Pain here 3/10, worse with walking, dull. Also with left thumb pain, locking over past few weeks. Has worsened, sharp, 7/10. Left handed. Has tried brace for this without success. No skin changes, numbness.  Past Medical History:  Diagnosis Date  . Abnormally small mouth   . Arthritis    hips and knees  . Asthma    daily inhaler, prn inhaler and neb.  . Breast cancer ([place]) 01/2014   left  . Dental bridge present    upper front and lower right  . Dental crowns present    x 3  . Family history of anesthesia complication    twin brother aspirated and died on OR table, per pt.  . Fibromyalgia   . GERD (gastroesophageal reflux disease)   . History of gastric ulcer    as a teenager  . History of seizure age 5   as a reaction to Penicillin - no seizures since  . History of thyroid cancer    s/p thyroidectomy  . Hyperlipidemia   . Hypothyroidism   . Migraines   . UTI (lower urinary tract infection) 02/17/2014    Current Outpatient Prescriptions on File Prior to Visit  Medication Sig Dispense Refill  . albuterol (PROAIR HFA) 108 (90 Base) MCG/ACT inhaler INHALE 2 PUFFS INTO THE LUNGS EVERY 6 HOURS AS NEEDED 25.5 g 1  . albuterol (PROVENTIL) (5 MG/ML) 0.5% nebulizer solution Take 2.5 mg by nebulization every 6 (six) hours as needed for  wheezing or shortness of breath.    . anastrozole (ARIMIDEX) 1 MG tablet Take 1 tablet by mouth  daily 90 tablet 3  . Calcium Carbonate (CALCIUM 500 PO) Take 1 tablet by mouth daily.     . diphenhydrAMINE (BENADRYL) 25 MG tablet Take 1 tablet an hour before MRI.  Then take 1 tablet at bedtime as needed, per your usual regimen. 30 tablet 0  . DULoxetine (CYMBALTA) 60 MG capsule Take 1 capsule by mouth  daily 90 capsule 1  . EPINEPHrine 0.3 mg/0.3 mL IJ SOAJ injection Inject 0.3 mLs (0.3 mg total) into the muscle once. 2 Device 0  . Ergocalciferol (VITAMIN D2) 400 UNITS TABS Take 1 tablet by mouth daily.    . Fluticasone-Salmeterol (ADVAIR DISKUS) 250-50 MCG/DOSE AEPB Inhale 1 puff into the lungs 2 (two) times daily. 180 each 1  . Investigational palbociclib (IBRANCE) 125 MG capsule [employer] [facility] AFT-05 [name] Take 1 capsule (125 mg total) by mouth daily. Take with food. Swallow whole. Do not chew. Take on days 1-21. Repeat every 28 days. 69 capsule 0  . levothyroxine (SYNTHROID, LEVOTHROID) 175 MCG tablet Take 1 tablet (175 mcg total) by mouth daily. Take an additional half tablet =87.49mcg on Monday and Friday (total 262.97mcg) (Patient taking differently: Take 175 mcg by mouth daily. ) 102 tablet 1  .  meclizine (ANTIVERT) 50 MG tablet Take 0.5 tablets (25 mg total) by mouth 3 (three) times daily as needed for dizziness or nausea. 21 tablet 0  . meloxicam (MOBIC) 7.5 MG tablet Take 1 tablet by mouth  daily 90 tablet 0  . Multiple Vitamin (MULTIVITAMIN) tablet Take 1 tablet by mouth daily.      Marland Kitchen neomycin-polymyxin-hydrocortisone (CORTISPORIN) otic solution Place 3 drops into the left ear 4 (four) times daily. 10 mL 0  . nitroGLYCERIN (NITROSTAT) 0.4 MG SL tablet Place 1 tablet (0.4 mg total) under the tongue every 5 (five) minutes as needed for chest pain. 30 tablet 0  . Omega-3 Fatty Acids (FISH OIL) 1200 MG CAPS Take 1 capsule by mouth daily.      Marland Kitchen oxybutynin (DITROPAN XL) 10 MG 24 hr tablet  Take 1 tablet (10 mg total) by mouth at bedtime. 90 tablet 1  . simvastatin (ZOCOR) 40 MG tablet Take 1 tablet by mouth  daily 90 tablet 0   No current facility-administered medications on file prior to visit.     Past Surgical History:  Procedure Laterality Date  . ABDOMINAL HYSTERECTOMY  2004   complete  . ACHILLES TENDON REPAIR Right   . APPENDECTOMY  2004  . BREAST LUMPECTOMY WITH NEEDLE LOCALIZATION AND AXILLARY SENTINEL LYMPH NODE BX Left 02/23/2014   Procedure: BREAST LUMPECTOMY WITH NEEDLE LOCALIZATION AND AXILLARY SENTINEL LYMPH NODE BIOPSY;  Surgeon: Excell Seltzer, MD;  Location: [place];  Service: General;  Laterality: Left;  . BREAST SURGERY  2011   left breast biposy  . CHOLECYSTECTOMY  1990  . COLONOSCOPY W/ POLYPECTOMY  06/2009  . EYE SURGERY Right 2013   exc. warts from underneath eyelid  . INCONTINENCE SURGERY  2004  . KNEE ARTHROSCOPY Bilateral    x 6 each knee  . LIGAMENT REPAIR Right    thumb/wrist  . ORIF TOE FRACTURE Right    great toe  . PORTACATH PLACEMENT N/A 03/24/2014   Procedure: INSERTION PORT-A-CATH;  Surgeon: Excell Seltzer, MD;  Location: [place];  Service: General;  Laterality: N/A;  . THYROIDECTOMY  2000  . TONSILLECTOMY AND ADENOIDECTOMY  2000  . TUMOR EXCISION     from thoracic spine    Allergies  Allergen Reactions  . Bee Venom Anaphylaxis  . Contrast Media [Iodinated Diagnostic Agents] Shortness Of Breath  . Iodine Other (See Comments)    CARDIAC ARREST  . Latex Anaphylaxis and Rash  . Penicillins Shortness Of Breath, Rash and Other (See Comments)    SEIZURE  . Shellfish Allergy Shortness Of Breath  . Aspirin Rash and Other (See Comments)    GI UPSET  . Erythromycin Swelling and Rash    SWELLING OF JOINTS  . Lidocaine Swelling    SWELLING OF MOUTH AND THROAT  . Symbicort [Budesonide-Formoterol Fumarate] Other (See Comments)    BURNING OF TONGUE AND LIPS  . Codeine Rash  . Compazine  [Prochlorperazine Maleate] Rash    Rash on face,chest, arms, back  . Pentazocine Lactate Rash  . Sulfonamide Derivatives Rash    Social History   Social History  . Marital status: Married    Spouse name: N/A  . Number of children: N/A  . Years of education: N/A   Occupational History  . student Unemployed   Social History Main Topics  . Smoking status: Never Smoker  . Smokeless tobacco: Never Used  . Alcohol use No  . Drug use: No  . Sexual activity: Not on  file     Comment: menarche age 48, P 2, first birth age 56, menopause age 44, Premarin x 10 yrs   Other Topics Concern  . Not on file   Social History Narrative   Regular exercise: yes          Family History  Problem Relation Age of Onset  . Alcohol abuse Mother   . Arthritis Mother   . Hypertension Mother   . Bipolar disorder Mother   . Breast cancer Mother 34    unconfirmed  . Lung cancer Mother 21    smoker  . Alcohol abuse Father   . Cancer Father     lung  . Hyperlipidemia Father   . Kidney disease Father   . Diabetes Father   . Arthritis Maternal Grandmother   . Diabetes Paternal Grandmother   . Thyroid cancer Sister 12    type?; currently 24  . Other Sister     ovarian tumor @ 42; TAH/BSO  . Thyroid cancer Brother     dx 23s; currently 71  . Breast cancer Maternal Aunt     dx 77s; deceased 3  . Thyroid cancer Paternal Aunt     All 3 paternal aunts with thyroid ca in 30s/40s  . Lung cancer Paternal Aunt     2 of 3 paternal aunts with lung cancer    BP (!) 146/89   Pulse 62   Ht 5\' 8"  (1.727 m)   Wt 269 lb (122 kg)   BMI 40.90 kg/m   Review of Systems: See HPI above.    Objective:  Physical Exam:  Gen: NAD, comfortable in exam room  Right foot/ankle: No gross deformity, swelling, ecchymoses FROM with pain on resisted ER, passive IR. TTP over peroneal tendons at ankle and peroneus brevis near insertion on 5th metatarsal.  No achilles or other tenderness. Negative ant drawer  and talar tilt.   Negative syndesmotic compression. Negative calcaneal squeeze. Thompsons test negative. NV intact distally.  Left foot/ankle: FROM without pain.  Left thumb: Catching and locking with flexion of IP joint.  Nodule at A1 pulley, painful.  No other deformity, swelling, ecchymoses. FROM MCP and IP joint with 5/5 strength. Collateral ligaments intact here also. TTP only at A1 pulley.  No tenderness 1st CMC joint, carpal tunnel, at IP joint. NVI distally.    Assessment & Plan:  1. Right ankle pain - achilles has improved - now with peroneal tendinopathy.  Discussed options - went ahead with ultrasound guided injection.  Icing, shown home exercises to do daily.  Arch supports.  Avoid flat shoes and barefoot walking.  Consider PT, nitro if not improving.  F/u in 1 month.  After informed written consent patient was seated on exam table.  Area overlying right peroneal tendons at level of ankle prepped with alcohol swab, injected sheaths of both with 1:50mL marcaine: depomedrol with ultrasound guidance.  Patient tolerated procedure well without immediate complications.  2. Left trigger thumb - injection given today.  Continue thumb spica brace.  Heat, mobic.  F/u in 1 month.  After informed written consent patient was seated on exam table.  Area overlying left 1st digit A1 pulley prepped with alcohol swab, injected with 0.5:0.76mL marcaine: depomedrol.  Patient tolerated procedure well without immediate complications.

## 2016-01-22 ENCOUNTER — Other Ambulatory Visit: Payer: Self-pay | Admitting: Family

## 2016-01-25 NOTE — Telephone Encounter (Signed)
This is being monitored by endocrinology and they have been refilling and monitoring. Could you please [facility] and ask them to send to her endocrinologist, Dr. Iran Planas?

## 2016-01-25 NOTE — Telephone Encounter (Signed)
Melissa-- I just sent refill of levothyroxine to [facility] for pt and saw that we have not checked a tsh level since 06/2014. Please advise?

## 2016-01-25 NOTE — Telephone Encounter (Signed)
Ok, I wasn't sure since it looked like we had been refilling it all year. Spoke with [place] and cancelled Rx and requested that they send it to Dr Tamala Julian.

## 2016-02-02 DIAGNOSIS — G4733 Obstructive sleep apnea (adult) (pediatric): Secondary | ICD-10-CM

## 2016-02-10 ENCOUNTER — Telehealth: Payer: Self-pay | Admitting: Family

## 2016-02-10 DIAGNOSIS — G4733 Obstructive sleep apnea (adult) (pediatric): Secondary | ICD-10-CM

## 2016-02-10 NOTE — Telephone Encounter (Signed)
Patient has been made aware that results are not back at this time and we we get the results we will call.   KP

## 2016-02-10 NOTE — Telephone Encounter (Signed)
Pt called stating she completed home sleep study 02/03/16 and returned unit 02/04/16. She is asking for results.  Ph# [phone]

## 2016-02-11 ENCOUNTER — Other Ambulatory Visit: Payer: Self-pay | Admitting: *Deleted

## 2016-02-11 DIAGNOSIS — G4733 Obstructive sleep apnea (adult) (pediatric): Secondary | ICD-10-CM | POA: Diagnosis not present

## 2016-02-11 DIAGNOSIS — R400 Somnolence: Secondary | ICD-10-CM

## 2016-02-11 NOTE — Telephone Encounter (Signed)
Melissa-- looks like result is still pending. Please advise when final.

## 2016-02-12 NOTE — Telephone Encounter (Signed)
Gilmore Laroche, could you please check with pulmonology re: status of read? thanks

## 2016-02-16 ENCOUNTER — Encounter: Payer: Self-pay | Admitting: Family

## 2016-02-16 DIAGNOSIS — G4733 Obstructive sleep apnea (adult) (pediatric): Secondary | ICD-10-CM

## 2016-02-16 HISTORY — DX: Obstructive sleep apnea (adult) (pediatric): G47.33

## 2016-02-16 NOTE — Addendum Note (Signed)
Addended by: Debbrah Alar on: 02/16/2016 12:24 PM   Modules accepted: Orders

## 2016-03-31 ENCOUNTER — Other Ambulatory Visit: Payer: Self-pay | Admitting: Medical Oncology

## 2016-03-31 DIAGNOSIS — C50412 Malignant neoplasm of upper-outer quadrant of left female breast: Secondary | ICD-10-CM

## 2016-04-04 ENCOUNTER — Encounter: Payer: Self-pay | Admitting: Family

## 2016-04-04 ENCOUNTER — Telehealth: Payer: Self-pay

## 2016-04-04 ENCOUNTER — Ambulatory Visit (INDEPENDENT_AMBULATORY_CARE_PROVIDER_SITE_OTHER): Payer: 59 | Admitting: Family

## 2016-04-04 VITALS — BP 132/60 | HR 79 | Temp 98.1°F | Resp 18 | Ht 68.0 in | Wt 274.8 lb

## 2016-04-04 DIAGNOSIS — G4733 Obstructive sleep apnea (adult) (pediatric): Secondary | ICD-10-CM

## 2016-04-04 DIAGNOSIS — N3281 Overactive bladder: Secondary | ICD-10-CM | POA: Diagnosis not present

## 2016-04-04 MED ORDER — MIRABEGRON ER 25 MG PO TB24: 25.0000 mg | ORAL_TABLET | Freq: Every day | ORAL | 2 refills | Status: DC

## 2016-04-04 NOTE — Progress Notes (Signed)
Subjective:    Patient ID: Michelle Kennedy, female    DOB: 1956-02-10, 60 y.o.   MRN: [id number]  HPI  Michelle Kennedy is a 60 yr old female who presents today for follow up of her sleep apnea.  Download report is reviewed today.  Notes 30/30 days usage.   Bladder spasms 3-4 times a day- with urge incontinence. Doesn't feel like the ditropan is helping.   Obesity- reports frustration with inability to lose weight.  Wt Readings from Last 3 Encounters:  04/04/16 274 lb 12.8 oz (124.6 kg)  01/18/16 269 lb (122 kg)  01/17/16 269 lb 6.4 oz (122.2 kg)     Review of Systems     Past Medical History:  Diagnosis Date  . Abnormally small mouth   . Arthritis    hips and knees  . Asthma    daily inhaler, prn inhaler and neb.  . Breast cancer ([place]) 01/2014   left  . Dental bridge present    upper front and lower right  . Dental crowns present    x 3  . Family history of anesthesia complication    twin brother aspirated and died on OR table, per pt.  . Fibromyalgia   . GERD (gastroesophageal reflux disease)   . History of gastric ulcer    as a teenager  . History of seizure age 78   as a reaction to Penicillin - no seizures since  . History of thyroid cancer    s/p thyroidectomy  . Hyperlipidemia   . Hypothyroidism   . Migraines   . OSA (obstructive sleep apnea) 02/16/2016  . UTI (lower urinary tract infection) 02/17/2014     Social History   Social History  . Marital status: Married    Spouse name: N/A  . Number of children: N/A  . Years of education: N/A   Occupational History  . student Unemployed   Social History Main Topics  . Smoking status: Never Smoker  . Smokeless tobacco: Never Used  . Alcohol use No  . Drug use: No  . Sexual activity: Not on file     Comment: menarche age 28, P 2, first birth age 1, menopause age 7, Premarin x 10 yrs   Other Topics Concern  . Not on file   Social History Narrative   Regular exercise: yes          Past Surgical  History:  Procedure Laterality Date  . ABDOMINAL HYSTERECTOMY  2004   complete  . ACHILLES TENDON REPAIR Right   . APPENDECTOMY  2004  . BREAST LUMPECTOMY WITH NEEDLE LOCALIZATION AND AXILLARY SENTINEL LYMPH NODE BX Left 02/23/2014   Procedure: BREAST LUMPECTOMY WITH NEEDLE LOCALIZATION AND AXILLARY SENTINEL LYMPH NODE BIOPSY;  Surgeon: Excell Seltzer, MD;  Location: [facility];  Service: General;  Laterality: Left;  . BREAST SURGERY  2011   left breast biposy  . CHOLECYSTECTOMY  1990  . COLONOSCOPY W/ POLYPECTOMY  06/2009  . EYE SURGERY Right 2013   exc. warts from underneath eyelid  . INCONTINENCE SURGERY  2004  . KNEE ARTHROSCOPY Bilateral    x 6 each knee  . LIGAMENT REPAIR Right    thumb/wrist  . ORIF TOE FRACTURE Right    great toe  . PORTACATH PLACEMENT N/A 03/24/2014   Procedure: INSERTION PORT-A-CATH;  Surgeon: Excell Seltzer, MD;  Location: [facility];  Service: General;  Laterality: N/A;  . THYROIDECTOMY  2000  . TONSILLECTOMY AND ADENOIDECTOMY  2000  . TUMOR EXCISION     from thoracic spine    Family History  Problem Relation Age of Onset  . Alcohol abuse Mother   . Arthritis Mother   . Hypertension Mother   . Bipolar disorder Mother   . Breast cancer Mother 89    unconfirmed  . Lung cancer Mother 40    smoker  . Alcohol abuse Father   . Cancer Father     lung  . Hyperlipidemia Father   . Kidney disease Father   . Diabetes Father   . Arthritis Maternal Grandmother   . Diabetes Paternal Grandmother   . Thyroid cancer Sister 33    type?; currently 58  . Other Sister     ovarian tumor @ 48; TAH/BSO  . Thyroid cancer Brother     dx 36s; currently 21  . Breast cancer Maternal Aunt     dx 29s; deceased 71  . Thyroid cancer Paternal Aunt     All 3 paternal aunts with thyroid ca in 30s/40s  . Lung cancer Paternal Aunt     2 of 3 paternal aunts with lung cancer    Allergies  Allergen Reactions  . Bee Venom Anaphylaxis    . Contrast Media [Iodinated Diagnostic Agents] Shortness Of Breath  . Iodine Other (See Comments)    CARDIAC ARREST  . Latex Anaphylaxis and Rash  . Penicillins Shortness Of Breath, Rash and Other (See Comments)    SEIZURE  . Shellfish Allergy Shortness Of Breath  . Aspirin Rash and Other (See Comments)    GI UPSET  . Erythromycin Swelling and Rash    SWELLING OF JOINTS  . Lidocaine Swelling    SWELLING OF MOUTH AND THROAT  . Symbicort [Budesonide-Formoterol Fumarate] Other (See Comments)    BURNING OF TONGUE AND LIPS  . Codeine Rash  . Compazine [Prochlorperazine Maleate] Rash    Rash on face,chest, arms, back  . Pentazocine Lactate Rash  . Sulfonamide Derivatives Rash    Current Outpatient Prescriptions on File Prior to Visit  Medication Sig Dispense Refill  . albuterol (PROAIR HFA) 108 (90 Base) MCG/ACT inhaler INHALE 2 PUFFS INTO THE LUNGS EVERY 6 HOURS AS NEEDED 25.5 g 1  . albuterol (PROVENTIL) (5 MG/ML) 0.5% nebulizer solution Take 2.5 mg by nebulization every 6 (six) hours as needed for wheezing or shortness of breath.    . anastrozole (ARIMIDEX) 1 MG tablet Take 1 tablet by mouth  daily 90 tablet 3  . Calcium Carbonate (CALCIUM 500 PO) Take 1 tablet by mouth daily.     . diphenhydrAMINE (BENADRYL) 25 MG tablet Take 1 tablet an hour before MRI.  Then take 1 tablet at bedtime as needed, per your usual regimen. 30 tablet 0  . DULoxetine (CYMBALTA) 60 MG capsule Take 1 capsule by mouth  daily 90 capsule 1  . EPINEPHrine 0.3 mg/0.3 mL IJ SOAJ injection Inject 0.3 mLs (0.3 mg total) into the muscle once. 2 Device 0  . Ergocalciferol (VITAMIN D2) 400 UNITS TABS Take 1 tablet by mouth daily.    . Fluticasone-Salmeterol (ADVAIR DISKUS) 250-50 MCG/DOSE AEPB Inhale 1 puff into the lungs 2 (two) times daily. 180 each 1  . Investigational palbociclib (IBRANCE) 125 MG capsule [employer] [facility] AFT-05 [name] Take 1 capsule (125 mg total) by mouth daily. Take with food. Swallow whole.  Do not chew. Take on days 1-21. Repeat every 28 days. 69 capsule 0  . levothyroxine (SYNTHROID, LEVOTHROID) 175 MCG tablet Take 1 tablet  by mouth  daily . Take additional 1/2 tablet = 87.5 mcg on Monday and Friday (total 262.5  mcg) 102 tablet 1  . meclizine (ANTIVERT) 50 MG tablet Take 0.5 tablets (25 mg total) by mouth 3 (three) times daily as needed for dizziness or nausea. 21 tablet 0  . meloxicam (MOBIC) 7.5 MG tablet Take 1 tablet by mouth  daily 90 tablet 0  . Multiple Vitamin (MULTIVITAMIN) tablet Take 1 tablet by mouth daily.      Marland Kitchen neomycin-polymyxin-hydrocortisone (CORTISPORIN) otic solution Place 3 drops into the left ear 4 (four) times daily. 10 mL 0  . nitroGLYCERIN (NITROSTAT) 0.4 MG SL tablet Place 1 tablet (0.4 mg total) under the tongue every 5 (five) minutes as needed for chest pain. 30 tablet 0  . Omega-3 Fatty Acids (FISH OIL) 1200 MG CAPS Take 1 capsule by mouth daily.      . simvastatin (ZOCOR) 40 MG tablet Take 1 tablet by mouth  daily 90 tablet 0   No current facility-administered medications on file prior to visit.     BP (!) 146/76 (BP Location: Right Arm, Patient Position: Sitting, Cuff Size: Large)   Pulse 79   Temp 98.1 F (36.7 C) (Oral)   Resp 18   Ht 5\' 8"  (1.727 m)   Wt 274 lb 12.8 oz (124.6 kg)   SpO2 98% Comment: RA  BMI 41.78 kg/m    Objective:   Physical Exam  Constitutional: She is oriented to person, place, and time. She appears well-developed and well-nourished.  HENT:  Head: Normocephalic and atraumatic.  Cardiovascular: Normal rate, regular rhythm and normal heart sounds.   No murmur heard. Pulmonary/Chest: Effort normal and breath sounds normal. No respiratory distress. She has no wheezes.  Musculoskeletal: She exhibits no edema.  Neurological: She is alert and oriented to person, place, and time.  Psychiatric: She has a normal mood and affect. Her behavior is normal. Judgment and thought content normal.          Assessment & Plan:   Overactive bladder- advised pt to d/c ditropan and will give trial of myrbetriq.  OSA- clinically improved on CPAP, continue current setting.  Obesity- discussed limiting calories to 1200 a day, continuing regular exercise.

## 2016-04-04 NOTE — Patient Instructions (Addendum)
Stop ditropan, start mybetriq.   Keep up the good work with the CPAP. Try to limit your calories to 1200 a day. Continue regular exercise.

## 2016-04-04 NOTE — Telephone Encounter (Signed)
Received fax from Baxter Flattery [facility]) regarding Patient's C-Pap download. Given to PCP for review.

## 2016-04-04 NOTE — Progress Notes (Signed)
Pre visit review using our clinic review tool, if applicable. No additional management support is needed unless otherwise documented below in the visit note. 

## 2016-04-04 NOTE — Telephone Encounter (Signed)
Spoke with Baxter Flattery of [facility], on 04/07/2016 at 2:54pm,  in regards to the Patient's C-Pap download. Was advised that she would fax the information to our Office. Fax number provided.

## 2016-04-05 ENCOUNTER — Encounter: Payer: Self-pay | Admitting: Family Medicine

## 2016-04-05 ENCOUNTER — Ambulatory Visit (INDEPENDENT_AMBULATORY_CARE_PROVIDER_SITE_OTHER): Payer: 59 | Admitting: Family Medicine

## 2016-04-05 VITALS — BP 148/95 | HR 73 | Ht 68.0 in | Wt 274.0 lb

## 2016-04-05 DIAGNOSIS — M25562 Pain in left knee: Secondary | ICD-10-CM

## 2016-04-05 DIAGNOSIS — G8929 Other chronic pain: Secondary | ICD-10-CM

## 2016-04-05 DIAGNOSIS — M25571 Pain in right ankle and joints of right foot: Secondary | ICD-10-CM

## 2016-04-05 MED ORDER — METHYLPREDNISOLONE ACETATE 40 MG/ML IJ SUSP
40.0000 mg | Freq: Once | INTRAMUSCULAR | Status: AC
  Administered 2016-04-05: 40 mg via INTRA_ARTICULAR

## 2016-04-05 NOTE — Patient Instructions (Signed)
You have peroneal tendinitis. Icing 15 minutes at a time 3-4 times a day. Stop the exercises for now. It's ok to take the meloxicam, ibuprofen, or aleve if needed. Try the boot when up and walking around - either put a scaphoid pad in here where your arch is or use your orthotics in these (or some over the counter inserts with arch support) Consider physical therapy, nitro patches, MRI if not improving.

## 2016-04-07 ENCOUNTER — Other Ambulatory Visit: Payer: Self-pay | Admitting: Family

## 2016-04-09 NOTE — Assessment & Plan Note (Signed)
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 M0 stage IIB Adjuvant chemotherapy Completed 4 cycles of dose dense Adriamycin and Cytoxan. Followed by Abraxane X 12 completed 08/18/14, status post radiation completed June 2016, started anastrozole 1 mg daily 11/20/2014 (also enrolled on [name] clinical trial randomized to [facility])   Lymphedema left arm: Improved with physical therapy for lymphedema History of thyroid cancer:Low likelihood of recurrence  Current treatment: Adjuvant antiestrogen therapy with anastrozole once daily for 5 years started 11/20/2014 + Ibrance ([name] trial), today cycle 15 day 1 Ibrance toxicities: 1. Grade 2 neutropenia: Did not require dose reduction, close monitoring of blood counts, ANC 2.4 on 01/17/16 2. Otherwise no major side effects to Ibrance 3. 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  Survivorship: Patient is now swimming and exercising every day. Is staying active. In spite of this she has gained 10 pounds weight.  Patient was provided with a consent for BWELL study   Breast Cancer Surveillance: 1. Breast exam: 04/09/2016 Normal except for scar tissue related to prior surgery and radiation 2. Mammogram January 2017 Category B breast density. No mammographic abnormalities postsurgical changes.  RTC3 months for labs and follow-up

## 2016-04-10 ENCOUNTER — Encounter: Payer: Self-pay | Admitting: Hematology and Oncology

## 2016-04-10 ENCOUNTER — Ambulatory Visit (HOSPITAL_BASED_OUTPATIENT_CLINIC_OR_DEPARTMENT_OTHER): Payer: 59 | Admitting: Hematology and Oncology

## 2016-04-10 ENCOUNTER — Encounter: Payer: Self-pay | Admitting: Medical Oncology

## 2016-04-10 ENCOUNTER — Other Ambulatory Visit (HOSPITAL_BASED_OUTPATIENT_CLINIC_OR_DEPARTMENT_OTHER): Payer: 59

## 2016-04-10 ENCOUNTER — Other Ambulatory Visit: Payer: Self-pay | Admitting: Hematology and Oncology

## 2016-04-10 DIAGNOSIS — Z170 Estrogen receptor positive status [ER+]: Secondary | ICD-10-CM | POA: Diagnosis not present

## 2016-04-10 DIAGNOSIS — Z006 Encounter for examination for normal comparison and control in clinical research program: Secondary | ICD-10-CM

## 2016-04-10 DIAGNOSIS — C50412 Malignant neoplasm of upper-outer quadrant of left female breast: Secondary | ICD-10-CM

## 2016-04-10 DIAGNOSIS — C50919 Malignant neoplasm of unspecified site of unspecified female breast: Secondary | ICD-10-CM

## 2016-04-10 DIAGNOSIS — Z79811 Long term (current) use of aromatase inhibitors: Secondary | ICD-10-CM | POA: Diagnosis not present

## 2016-04-10 LAB — COMPREHENSIVE METABOLIC PANEL
ALBUMIN: 3.5 g/dL (ref 3.5–5.0)
ALT: 30 U/L (ref 0–55)
AST: 22 U/L (ref 5–34)
Alkaline Phosphatase: 62 U/L (ref 40–150)
Anion Gap: 8 mEq/L (ref 3–11)
BUN: 12.6 mg/dL (ref 7.0–26.0)
CHLORIDE: 106 meq/L (ref 98–109)
CO2: 25 mEq/L (ref 22–29)
Calcium: 9.8 mg/dL (ref 8.4–10.4)
Creatinine: 0.8 mg/dL (ref 0.6–1.1)
EGFR: 75 mL/min/{1.73_m2} — ABNORMAL LOW (ref >90)
GLUCOSE: 95 mg/dL (ref 70–140)
POTASSIUM: 3.9 meq/L (ref 3.5–5.1)
SODIUM: 140 meq/L (ref 136–145)
Total Bilirubin: 0.39 mg/dL (ref 0.20–1.20)
Total Protein: 6.5 g/dL (ref 6.4–8.3)

## 2016-04-10 LAB — CBC WITH DIFFERENTIAL/PLATELET
BASO%: 1.4 % (ref 0.0–2.0)
BASOS ABS: 0 10*3/uL (ref 0.0–0.1)
EOS%: 1.6 % (ref 0.0–7.0)
Eosinophils Absolute: 0 10*3/uL (ref 0.0–0.5)
HCT: 40.6 % (ref 34.8–46.6)
HEMOGLOBIN: 12.9 g/dL (ref 11.6–15.9)
LYMPH%: 30.5 % (ref 14.0–49.7)
MCH: 27.1 pg (ref 25.1–34.0)
MCHC: 31.7 g/dL (ref 31.5–36.0)
MCV: 85.3 fL (ref 79.5–101.0)
MONO#: 0.4 10*3/uL (ref 0.1–0.9)
MONO%: 13.2 % (ref 0.0–14.0)
NEUT#: 1.6 10*3/uL (ref 1.5–6.5)
NEUT%: 53.3 % (ref 38.4–76.8)
Platelets: 208 10*3/uL (ref 145–400)
RBC: 4.76 10*6/uL (ref 3.70–5.45)
RDW: 16.8 % — AB (ref 11.2–14.5)
WBC: 2.9 10*3/uL — ABNORMAL LOW (ref 3.9–10.3)
lymph#: 0.9 10*3/uL (ref 0.9–3.3)

## 2016-04-10 MED ORDER — INV-PALBOCICLIB 125 MG CAPS #23 ALLIANCE FOUNDATION AFT-05 (PALLAS): 125.0000 mg | ORAL_CAPSULE | Freq: Every day | ORAL | 0 refills | Status: DC

## 2016-04-10 NOTE — Progress Notes (Addendum)
PCP: Nance Pear., NP  Subjective:   HPI: Patient is a 60 y.o. female here for right foot pain.  7/7: Patient reports 1 week ago she was walking in the woods with family when she tripped over a log and fell to her right side. Able to bear weight after this. Pain however felt posterior right foot/heel. Initial swelling here as well Has been icing and taking mobic. Pain improved with rest, still worse with ambulation. No skin changes, numbness. Pain is dull.  8/29: Patient denies new injury.   Reports right ankle pain has moved and is now lateral about distal ankle and foot. Worse with turning foot inwards. Pain here 3/10, worse with walking, dull. Also with left thumb pain, locking over past few weeks. Has worsened, sharp, 7/10. Left handed. Has tried brace for this without success. No skin changes, numbness.  11/15: Patient returns with continued pain posterior and lateral right heel. Not much improvement following injection. No new injuries. Using heel lift. Feels a knot on side of heel. Pain level 6/10, sharp. Worse with ambulation. No other skin changes, numbness.  Past Medical History:  Diagnosis Date  . Abnormally small mouth   . Arthritis    hips and knees  . Asthma    daily inhaler, prn inhaler and neb.  . Breast cancer ([place]) 01/2014   left  . Dental bridge present    upper front and lower right  . Dental crowns present    x 3  . Family history of anesthesia complication    twin brother aspirated and died on OR table, per pt.  . Fibromyalgia   . GERD (gastroesophageal reflux disease)   . History of gastric ulcer    as a teenager  . History of seizure age 89   as a reaction to Penicillin - no seizures since  . History of thyroid cancer    s/p thyroidectomy  . Hyperlipidemia   . Hypothyroidism   . Migraines   . OSA (obstructive sleep apnea) 02/16/2016  . UTI (lower urinary tract infection) 02/17/2014    Current Outpatient Prescriptions on  File Prior to Visit  Medication Sig Dispense Refill  . albuterol (PROAIR HFA) 108 (90 Base) MCG/ACT inhaler INHALE 2 PUFFS INTO THE LUNGS EVERY 6 HOURS AS NEEDED 25.5 g 1  . albuterol (PROVENTIL) (5 MG/ML) 0.5% nebulizer solution Take 2.5 mg by nebulization every 6 (six) hours as needed for wheezing or shortness of breath.    . anastrozole (ARIMIDEX) 1 MG tablet Take 1 tablet by mouth  daily 90 tablet 3  . Calcium Carbonate (CALCIUM 500 PO) Take 1 tablet by mouth daily.     . diphenhydrAMINE (BENADRYL) 25 MG tablet Take 1 tablet an hour before MRI.  Then take 1 tablet at bedtime as needed, per your usual regimen. 30 tablet 0  . DULoxetine (CYMBALTA) 60 MG capsule Take 1 capsule by mouth  daily 90 capsule 1  . EPINEPHrine 0.3 mg/0.3 mL IJ SOAJ injection Inject 0.3 mLs (0.3 mg total) into the muscle once. 2 Device 0  . Ergocalciferol (VITAMIN D2) 400 UNITS TABS Take 1 tablet by mouth daily.    . Fluticasone-Salmeterol (ADVAIR DISKUS) 250-50 MCG/DOSE AEPB Inhale 1 puff into the lungs 2 (two) times daily. 180 each 1  . Investigational palbociclib (IBRANCE) 125 MG capsule [employer] [facility] AFT-05 [name] Take 1 capsule (125 mg total) by mouth daily. Take with food. Swallow whole. Do not chew. Take on days 1-21. Repeat every 28 days. [place]  capsule 0  . levothyroxine (SYNTHROID, LEVOTHROID) 175 MCG tablet Take 1 tablet by mouth  daily . Take additional 1/2 tablet = 87.5 mcg on Monday and Friday (total 262.5  mcg) 102 tablet 1  . meclizine (ANTIVERT) 50 MG tablet Take 0.5 tablets (25 mg total) by mouth 3 (three) times daily as needed for dizziness or nausea. 21 tablet 0  . mirabegron ER (MYRBETRIQ) 25 MG TB24 tablet Take 1 tablet (25 mg total) by mouth daily. 30 tablet 2  . Multiple Vitamin (MULTIVITAMIN) tablet Take 1 tablet by mouth daily.      Marland Kitchen neomycin-polymyxin-hydrocortisone (CORTISPORIN) otic solution Place 3 drops into the left ear 4 (four) times daily. 10 mL 0  . nitroGLYCERIN (NITROSTAT) 0.4 MG SL  tablet Place 1 tablet (0.4 mg total) under the tongue every 5 (five) minutes as needed for chest pain. 30 tablet 0  . Omega-3 Fatty Acids (FISH OIL) 1200 MG CAPS Take 1 capsule by mouth daily.      . simvastatin (ZOCOR) 40 MG tablet Take 1 tablet by mouth  daily 90 tablet 0   No current facility-administered medications on file prior to visit.     Past Surgical History:  Procedure Laterality Date  . ABDOMINAL HYSTERECTOMY  2004   complete  . ACHILLES TENDON REPAIR Right   . APPENDECTOMY  2004  . BREAST LUMPECTOMY WITH NEEDLE LOCALIZATION AND AXILLARY SENTINEL LYMPH NODE BX Left 02/23/2014   Procedure: BREAST LUMPECTOMY WITH NEEDLE LOCALIZATION AND AXILLARY SENTINEL LYMPH NODE BIOPSY;  Surgeon: Excell Seltzer, MD;  Location: [facility];  Service: General;  Laterality: Left;  . BREAST SURGERY  2011   left breast biposy  . CHOLECYSTECTOMY  1990  . COLONOSCOPY W/ POLYPECTOMY  06/2009  . EYE SURGERY Right 2013   exc. warts from underneath eyelid  . INCONTINENCE SURGERY  2004  . KNEE ARTHROSCOPY Bilateral    x 6 each knee  . LIGAMENT REPAIR Right    thumb/wrist  . ORIF TOE FRACTURE Right    great toe  . PORTACATH PLACEMENT N/A 03/24/2014   Procedure: INSERTION PORT-A-CATH;  Surgeon: Excell Seltzer, MD;  Location: [facility];  Service: General;  Laterality: N/A;  . THYROIDECTOMY  2000  . TONSILLECTOMY AND ADENOIDECTOMY  2000  . TUMOR EXCISION     from thoracic spine    Allergies  Allergen Reactions  . Bee Venom Anaphylaxis  . Contrast Media [Iodinated Diagnostic Agents] Shortness Of Breath  . Iodine Other (See Comments)    CARDIAC ARREST  . Latex Anaphylaxis and Rash  . Penicillins Shortness Of Breath, Rash and Other (See Comments)    SEIZURE  . Shellfish Allergy Shortness Of Breath  . Aspirin Rash and Other (See Comments)    GI UPSET  . Erythromycin Swelling and Rash    SWELLING OF JOINTS  . Lidocaine Swelling    SWELLING OF MOUTH AND  THROAT  . Symbicort [Budesonide-Formoterol Fumarate] Other (See Comments)    BURNING OF TONGUE AND LIPS  . Codeine Rash  . Compazine [Prochlorperazine Maleate] Rash    Rash on face,chest, arms, back  . Pentazocine Lactate Rash  . Sulfonamide Derivatives Rash    Social History   Social History  . Marital status: Married    Spouse name: N/A  . Number of children: N/A  . Years of education: N/A   Occupational History  . student Unemployed   Social History Main Topics  . Smoking status: Never Smoker  . Smokeless  tobacco: Never Used  . Alcohol use No  . Drug use: No  . Sexual activity: Not on file     Comment: menarche age 71, P 2, first birth age 3, menopause age 55, Premarin x 10 yrs   Other Topics Concern  . Not on file   Social History Narrative   Regular exercise: yes          Family History  Problem Relation Age of Onset  . Alcohol abuse Mother   . Arthritis Mother   . Hypertension Mother   . Bipolar disorder Mother   . Breast cancer Mother 27    unconfirmed  . Lung cancer Mother 58    smoker  . Alcohol abuse Father   . Cancer Father     lung  . Hyperlipidemia Father   . Kidney disease Father   . Diabetes Father   . Arthritis Maternal Grandmother   . Diabetes Paternal Grandmother   . Thyroid cancer Sister 107    type?; currently 47  . Other Sister     ovarian tumor @ 24; TAH/BSO  . Thyroid cancer Brother     dx 42s; currently 23  . Breast cancer Maternal Aunt     dx 3s; deceased 59  . Thyroid cancer Paternal Aunt     All 3 paternal aunts with thyroid ca in 30s/40s  . Lung cancer Paternal Aunt     2 of 3 paternal aunts with lung cancer    BP (!) 148/95   Pulse 73   Ht 5\' 8"  (1.727 m)   Wt 274 lb (124.3 kg)   BMI 41.66 kg/m   Review of Systems: See HPI above.    Objective:  Physical Exam:  Gen: NAD, comfortable in exam room  Right foot/ankle: No gross deformity, swelling, ecchymoses FROM with pain on resisted ER, passive IR. TTP  over peroneal tendons at ankle and lateral aspect of achilles at insertion.  No other tenderness. Negative ant drawer and talar tilt.   Negative syndesmotic compression. Negative calcaneal squeeze. Thompsons test negative. NV intact distally.  Left foot/ankle: FROM without pain.    Assessment & Plan:  1. Right ankle pain - 2/2 achilles tendinitis and primarily achilles tendinopathy now.  S/p peroneal sheath injection without much benefit.  Has history of migraines so would like to avoid nitro if possible.  She will try short course of cam walker with arch support.  Icing.  Meloxicam if needed.  Consider PT, MRI if not improving.  2. At end of visit patient requested intraarticular injection for left knee - known DJD.  Given today.  After informed written consent, patient was seated on exam table. Left knee was prepped with alcohol swab and utilizing anteromedial approach, patient's left knee was injected intraarticularly with 3:1 marcaine: depomedrol. Patient tolerated the procedure well without immediate complications.

## 2016-04-10 NOTE — Assessment & Plan Note (Signed)
2/2 achilles tendinitis and primarily achilles tendinopathy now.  S/p peroneal sheath injection without much benefit.  Has history of migraines so would like to avoid nitro if possible.  She will try short course of cam walker with arch support.  Icing.  Meloxicam if needed.  Consider PT, MRI if not improving.

## 2016-04-10 NOTE — Progress Notes (Signed)
[name]: Cycle 18 (19 and 20). Patient in clinic today for study required labs, MD visit and evaluation for start of cycle 18. Patient completed study required PRO's with research assistant Remer Macho, prior to her lab appointment. Patient also returned empty study dispensed palbociclib bottles from cycles 15 [id number]), 16 [id number]) and 17 [id number]) along with her medication diaries for each cycle with documentation of no missed palbociclib or her antihormone therapy medication. Patient confirms no difficulty with self dispensing or documenting of daily palbociclib and arimidex intake.  Bottles [id number], [id number] and [id number] were given to PharmD Henreitta Leber for documentation of return. Concomitant medication's reviewed and changes noted. Patient denies any new or unusual adverse events and denies any concerns. Patient denies fevers, nausea or vomiting, headaches, pain, as well as diarrhea or constipation.  Patient has been placed on a CPAP in September by her PCP and reports that the fatigue she was having has significantly improved since starting with the CPAP. Patient does report resolution to right hip arthralgia in July. Also confirms resolution of left upper lymphedema toward the very end of June. Inquired with patient any new information regarding the Lupus diagnosis back in April 2017, patient reports that her PCP is monitoring only, patient states she does not have Lupus, only the "markers" for it. Per MD's physical assessment of patient, review of any patient reported or ongoing AE's, as well as review of labs and NCS labs, patient cleared to continue with palbociclib 125 mg and to start with Cycle 18 today. Patient was provided with study assigned bottles for cycles 18, 19 and 20, #[id number], [id number] and [id number], of palbociclib medication, dispensed by PharmD Henreitta Leber and given to patient by this nurse, along with medication diaries for each cycle. Patient knows to call clinic with any questions or concerns.  Patient will return for Cycle 21, July 03, 2016.   Reconsented for ICF Version 3.1, dated 01/14/16 at today's visit. Patient and I reviewed, page by page version 3.1 ICF for all new changes to this updated consent form. After all patient's questions answered to her satisfaction, patient proceeded to initial and date each page, and sign and date with her signature at the indicated areas. Patient insisted that she did not need a copy of today's signed consent form. I insisted that she did and copy will be mailed to her for her records. Patient was thanked for her time and continued support of study and encouraged to call Dr. Lindi Adie or myself with any questions or concerns she may have. Adele Dan, RN, BSN Clinical Research 04/10/2016 12:44 PM

## 2016-04-10 NOTE — Progress Notes (Signed)
Patient Care Team: Debbrah Alar, NP as PCP - General (Internal Medicine) Excell Seltzer, MD as Consulting Physician (General Surgery) Nicholas Lose, MD as Consulting Physician (Hematology and Oncology) [name], MD as Attending Physician (Radiation Oncology) Katheran James., MD (Endocrinology)  DIAGNOSIS:  Encounter Diagnosis  Name Primary?  . Malignant neoplasm of upper-outer quadrant of left breast in female, estrogen receptor positive ([place])     SUMMARY OF ONCOLOGIC HISTORY:   Breast cancer of upper-outer quadrant of left female breast ([place])   12/22/2013 Mammogram    Left breast upper-outer quadrant 1.8 x 1.4 x 1.6 cm mass with left axillary lymph node measuring 3.6 mm      12/30/2013 Breast MRI    Dominant enhancing left upper outer quadrant irregular mass encompassing a confluent area of abnormal clumped nodular enhancement, 5.5 cm overall, 6 mm masslike enhancement central left breast        02/23/2014 Surgery    Left lumpectomy: Invasive ductal carcinoma grade 2 spending 3.8 cm intermediate grade DCIS with lymphovascular invasion margins negative one out of 4 SLN positive, ER 99%, PR 100%, HER-2 negative ratio 0.74, T2, N1, M0 stage IIB      03/19/2014 PET scan    No evidence of distant metastatic disease      03/31/2014 - 08/18/2014 Chemotherapy    Adjuvant chemotherapy with dose dense Adriamycin Cytoxan x4 followed by Abraxane weekly x12      08/28/2014 Imaging    Left brachial vein thrombus      09/11/2014 -  Radiation Therapy    Adjuvant radiation therapy      11/20/2014 -  Anti-estrogen oral therapy    Anastrozole 1 mg daily + Ibrance ([name] clinical trial)       CHIEF COMPLIANT: No problems tolerating anastrozole or Ibrance  INTERVAL HISTORY: Michelle Kennedy is a 60 year old with above-mentioned history of left breast cancer currently on adjuvant antiestrogen therapy with anastrozole. She is currently participating in [place] clinical trial  and is randomized to [place]. She has been on it since July 2016.  she is been tolerating the study drug extremely well. She denies any fatigue or GI side effects.   however she has had some problems with esophageal spasm that were ongoing even prior to starting this treatment.   recently she had a sleep study and it showed desaturations and requires now to wear his sleep apnea mask at bedtime.   REVIEW OF SYSTEMS:   Constitutional: Denies fevers, chills or abnormal weight loss Eyes: Denies blurriness of vision Ears, nose, mouth, throat, and face: Denies mucositis or sore throat Respiratory: Denies cough, dyspnea or wheezes Cardiovascular: Denies palpitation, chest discomfort Gastrointestinal:  Denies nausea, heartburn or change in bowel habits Skin: Denies abnormal skin rashes Lymphatics: Denies new lymphadenopathy or easy bruising Neurological:Denies numbness, tingling or new weaknesses Behavioral/Psych: Mood is stable, no new changes  Extremities: No lower extremity edema Breast:  denies any pain or lumps or nodules in either breasts All other systems were reviewed with the patient and are negative.  I have reviewed the past medical history, past surgical history, social history and family history with the patient and they are unchanged from previous note.  ALLERGIES:  is allergic to bee venom; contrast media [iodinated diagnostic agents]; iodine; latex; penicillins; shellfish allergy; aspirin; erythromycin; lidocaine; symbicort [budesonide-formoterol fumarate]; codeine; compazine [prochlorperazine maleate]; pentazocine lactate; and sulfonamide derivatives.  MEDICATIONS:  Current Outpatient Prescriptions  Medication Sig Dispense Refill  . albuterol (PROAIR HFA) 108 (90 Base)  MCG/ACT inhaler INHALE 2 PUFFS INTO THE LUNGS EVERY 6 HOURS AS NEEDED 25.5 g 1  . albuterol (PROVENTIL) (5 MG/ML) 0.5% nebulizer solution Take 2.5 mg by nebulization every 6 (six) hours as needed for wheezing or  shortness of breath.    . anastrozole (ARIMIDEX) 1 MG tablet Take 1 tablet by mouth  daily 90 tablet 3  . Calcium Carbonate (CALCIUM 500 PO) Take 1 tablet by mouth daily.     . diphenhydrAMINE (BENADRYL) 25 MG tablet Take 1 tablet an hour before MRI.  Then take 1 tablet at bedtime as needed, per your usual regimen. 30 tablet 0  . DULoxetine (CYMBALTA) 60 MG capsule Take 1 capsule by mouth  daily 90 capsule 1  . EPINEPHrine 0.3 mg/0.3 mL IJ SOAJ injection Inject 0.3 mLs (0.3 mg total) into the muscle once. 2 Device 0  . Ergocalciferol (VITAMIN D2) 400 UNITS TABS Take 1 tablet by mouth daily.    . Fluticasone-Salmeterol (ADVAIR DISKUS) 250-50 MCG/DOSE AEPB Inhale 1 puff into the lungs 2 (two) times daily. 180 each 1  . Investigational palbociclib (IBRANCE) 125 MG capsule [facility] AFT-05 [name] Take 1 capsule (125 mg total) by mouth daily. Take with food. Swallow whole. Do not chew. Take on days 1-21. Repeat every 28 days. 69 capsule 0  . levothyroxine (SYNTHROID, LEVOTHROID) 175 MCG tablet Take 1 tablet by mouth  daily . Take additional 1/2 tablet = 87.5 mcg on Monday and Friday (total 262.5  mcg) 102 tablet 1  . meclizine (ANTIVERT) 50 MG tablet Take 0.5 tablets (25 mg total) by mouth 3 (three) times daily as needed for dizziness or nausea. 21 tablet 0  . meloxicam (MOBIC) 7.5 MG tablet TAKE 1 TABLET BY MOUTH  DAILY 90 tablet 0  . mirabegron ER (MYRBETRIQ) 25 MG TB24 tablet Take 1 tablet (25 mg total) by mouth daily. 30 tablet 2  . Multiple Vitamin (MULTIVITAMIN) tablet Take 1 tablet by mouth daily.      Marland Kitchen neomycin-polymyxin-hydrocortisone (CORTISPORIN) otic solution Place 3 drops into the left ear 4 (four) times daily. 10 mL 0  . nitroGLYCERIN (NITROSTAT) 0.4 MG SL tablet Place 1 tablet (0.4 mg total) under the tongue every 5 (five) minutes as needed for chest pain. 30 tablet 0  . Omega-3 Fatty Acids (FISH OIL) 1200 MG CAPS Take 1 capsule by mouth daily.      . simvastatin (ZOCOR) 40 MG  tablet Take 1 tablet by mouth  daily 90 tablet 0   No current facility-administered medications for this visit.     PHYSICAL EXAMINATION: ECOG PERFORMANCE STATUS: 1 - Symptomatic but completely ambulatory  Vitals:   04/10/16 1042  BP: 127/80  Pulse: 62  Resp: 19  Temp: 98 F (36.7 C)   Filed Weights   04/10/16 1042  Weight: 272 lb 8 oz (123.6 kg)    GENERAL:alert, no distress and comfortable SKIN: skin color, texture, turgor are normal, no rashes or significant lesions EYES: normal, Conjunctiva are pink and non-injected, sclera clear OROPHARYNX:no exudate, no erythema and lips, buccal mucosa, and tongue normal  NECK: supple, thyroid normal size, non-tender, without nodularity LYMPH:  no palpable lymphadenopathy in the cervical, axillary or inguinal LUNGS: clear to auscultation and percussion with normal breathing effort HEART: regular rate & rhythm and no murmurs and no lower extremity edema ABDOMEN:abdomen soft, non-tender and normal bowel sounds MUSCULOSKELETAL:no cyanosis of digits and no clubbing  NEURO: alert & oriented x 3 with fluent speech, no focal motor/sensory deficits EXTREMITIES:  No lower extremity edema BREAST: No palpable masses or nodules in either right or left breasts. No palpable axillary supraclavicular or infraclavicular adenopathy no breast tenderness or nipple discharge. (exam performed in the presence of a chaperone)  LABORATORY DATA:  I have reviewed the data as listed   Chemistry      Component Value Date/Time   NA 140 04/10/2016 1018   K 3.9 04/10/2016 1018   CL 103 09/16/2014 1140   CO2 25 04/10/2016 1018   BUN 12.6 04/10/2016 1018   CREATININE 0.8 04/10/2016 1018      Component Value Date/Time   CALCIUM 9.8 04/10/2016 1018   ALKPHOS 62 04/10/2016 1018   AST 22 04/10/2016 1018   ALT 30 04/10/2016 1018   BILITOT 0.39 04/10/2016 1018       Lab Results  Component Value Date   WBC 2.9 (L) 04/10/2016   HGB 12.9 04/10/2016   HCT 40.6  04/10/2016   MCV 85.3 04/10/2016   PLT 208 04/10/2016   NEUTROABS 1.6 04/10/2016     ASSESSMENT & PLAN:  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 M0 stage IIB Adjuvant chemotherapy Completed 4 cycles of dose dense Adriamycin and Cytoxan. Followed by Abraxane X 12 completed 08/18/14, status post radiation completed June 2016, started anastrozole 1 mg daily 11/20/2014 (also enrolled on [name] clinical trial randomized to [facility])   Lymphedema left arm: Improved with physical therapy for lymphedema History of thyroid cancer:Low likelihood of recurrence  Current treatment: Adjuvant antiestrogen therapy with anastrozole once daily for 5 years started 11/20/2014 + Ibrance ([name] trial), today cycle 15 day 1 Ibrance toxicities: 1. Grade 2 neutropenia: Did not require dose reduction, close monitoring of blood counts, ANC 2.4 on 01/17/16 2. Otherwise no major side effects to Ibrance 3. 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  continue the same dosage of Ibrance.  Survivorship: Patient is now swimming and exercising every day. Is staying active. I gave her information on live strong program.  Breast Cancer Surveillance: 1. Breast exam: 04/09/2016 Normal except for scar tissue related to prior surgery and radiation 2. Mammogram January 2017 Category B breast density. No mammographic abnormalities postsurgical changes.  RTC3 months for labs and follow-up   No orders of the defined types were placed in this encounter.  The patient has a good understanding of the overall plan. she agrees with it. she will call with any problems that may develop before the next visit here.   Rulon Eisenmenger, MD 04/10/16

## 2016-04-11 LAB — HEMOGLOBIN A1C
ESTIMATED AVERAGE GLUCOSE: 117 mg/dL
Hemoglobin A1c: 5.7 % — ABNORMAL HIGH (ref 4.8–5.6)

## 2016-04-17 ENCOUNTER — Encounter: Payer: Self-pay | Admitting: Family

## 2016-04-17 MED ORDER — MIRABEGRON ER 25 MG PO TB24: 25.0000 mg | ORAL_TABLET | Freq: Every day | ORAL | 1 refills | Status: DC

## 2016-04-21 ENCOUNTER — Other Ambulatory Visit: Payer: Self-pay | Admitting: Family

## 2016-04-21 NOTE — Telephone Encounter (Signed)
Refill sent per [facility] refill protocol/SLS  

## 2016-05-26 ENCOUNTER — Ambulatory Visit (INDEPENDENT_AMBULATORY_CARE_PROVIDER_SITE_OTHER): Payer: 59 | Admitting: Medical

## 2016-05-26 ENCOUNTER — Encounter: Payer: Self-pay | Admitting: Medical

## 2016-05-26 VITALS — BP 112/84 | HR 62 | Temp 98.0°F | Resp 16 | Ht 68.0 in | Wt 272.0 lb

## 2016-05-26 DIAGNOSIS — G4733 Obstructive sleep apnea (adult) (pediatric): Secondary | ICD-10-CM | POA: Diagnosis not present

## 2016-05-26 DIAGNOSIS — J0100 Acute maxillary sinusitis, unspecified: Secondary | ICD-10-CM

## 2016-05-26 DIAGNOSIS — J209 Acute bronchitis, unspecified: Secondary | ICD-10-CM

## 2016-05-26 MED ORDER — DOXYCYCLINE HYCLATE 100 MG PO TABS: 100.0000 mg | ORAL_TABLET | Freq: Two times a day (BID) | ORAL | 0 refills | Status: DC

## 2016-05-26 MED ORDER — BENZONATATE 100 MG PO CAPS: 100.0000 mg | ORAL_CAPSULE | Freq: Three times a day (TID) | ORAL | 0 refills | Status: DC | PRN

## 2016-05-26 MED ORDER — FLUTICASONE PROPIONATE 50 MCG/ACT NA SUSP: 2.0000 | Freq: Every day | NASAL | 1 refills | Status: DC

## 2016-05-26 MED ORDER — PREDNISONE 10 MG PO TABS: ORAL_TABLET | ORAL | 0 refills | Status: DC

## 2016-05-26 NOTE — Progress Notes (Signed)
Pre visit review using our clinic review tool, if applicable. No additional management support is needed unless otherwise documented below in the visit note/SLS  

## 2016-05-26 NOTE — Progress Notes (Signed)
Subjective:    Patient ID: Michelle Kennedy, female    DOB: 10-Oct-1955, 61 y.o.   MRN: [id number]  HPI   Pt in with sinus and chest congestion, mid ha and ear pressure. Some mild st and productive cough. Symptoms for 5 days. Pt using some ibuprofen. Pt is concerned for pneumonia.  Pt has wheezed worse recently last 3 days. Pt has been using albuterol twice yesterday but usually uses once a day.     Review of Systems  Constitutional: Positive for chills and fatigue. Negative for diaphoresis and fever.  HENT: Positive for congestion, ear pain, postnasal drip, sinus pain and sinus pressure. Negative for sore throat.   Respiratory: Positive for cough and wheezing. Negative for chest tightness.        Feels chest congested  Cardiovascular: Negative for chest pain and palpitations.  Gastrointestinal: Negative for abdominal pain.  Musculoskeletal: Negative for back pain and myalgias.  Psychiatric/Behavioral: Negative for behavioral problems, confusion and dysphoric mood.   Past Medical History:  Diagnosis Date  . Abnormally small mouth   . Arthritis    hips and knees  . Asthma    daily inhaler, prn inhaler and neb.  . Breast cancer ([place]) 01/2014   left  . Dental bridge present    upper front and lower right  . Dental crowns present    x 3  . Family history of anesthesia complication    twin brother aspirated and died on OR table, per pt.  . Fibromyalgia   . GERD (gastroesophageal reflux disease)   . History of gastric ulcer    as a teenager  . History of seizure age 23   as a reaction to Penicillin - no seizures since  . History of thyroid cancer    s/p thyroidectomy  . Hyperlipidemia   . Hypothyroidism   . Migraines   . OSA (obstructive sleep apnea) 02/16/2016  . UTI (lower urinary tract infection) 02/17/2014     Social History   Social History  . Marital status: Married    Spouse name: N/A  . Number of children: N/A  . Years of education: N/A   Occupational  History  . student Unemployed   Social History Main Topics  . Smoking status: Never Smoker  . Smokeless tobacco: Never Used  . Alcohol use No  . Drug use: No  . Sexual activity: Not on file     Comment: menarche age 73, P 2, first birth age 15, menopause age 23, Premarin x 10 yrs   Other Topics Concern  . Not on file   Social History Narrative   Regular exercise: yes          Past Surgical History:  Procedure Laterality Date  . ABDOMINAL HYSTERECTOMY  2004   complete  . ACHILLES TENDON REPAIR Right   . APPENDECTOMY  2004  . BREAST LUMPECTOMY WITH NEEDLE LOCALIZATION AND AXILLARY SENTINEL LYMPH NODE BX Left 02/23/2014   Procedure: BREAST LUMPECTOMY WITH NEEDLE LOCALIZATION AND AXILLARY SENTINEL LYMPH NODE BIOPSY;  Surgeon: Excell Seltzer, MD;  Location: [facility];  Service: General;  Laterality: Left;  . BREAST SURGERY  2011   left breast biposy  . CHOLECYSTECTOMY  1990  . COLONOSCOPY W/ POLYPECTOMY  06/2009  . EYE SURGERY Right 2013   exc. warts from underneath eyelid  . INCONTINENCE SURGERY  2004  . KNEE ARTHROSCOPY Bilateral    x 6 each knee  . LIGAMENT REPAIR Right    thumb/wrist  .  ORIF TOE FRACTURE Right    great toe  . PORTACATH PLACEMENT N/A 03/24/2014   Procedure: INSERTION PORT-A-CATH;  Surgeon: Excell Seltzer, MD;  Location: [facility];  Service: General;  Laterality: N/A;  . THYROIDECTOMY  2000  . TONSILLECTOMY AND ADENOIDECTOMY  2000  . TUMOR EXCISION     from thoracic spine    Family History  Problem Relation Age of Onset  . Alcohol abuse Mother   . Arthritis Mother   . Hypertension Mother   . Bipolar disorder Mother   . Breast cancer Mother 36    unconfirmed  . Lung cancer Mother 1    smoker  . Alcohol abuse Father   . Cancer Father     lung  . Hyperlipidemia Father   . Kidney disease Father   . Diabetes Father   . Arthritis Maternal Grandmother   . Diabetes Paternal Grandmother   . Thyroid cancer  Sister 76    type?; currently 81  . Other Sister     ovarian tumor @ 66; TAH/BSO  . Thyroid cancer Brother     dx 47s; currently 83  . Breast cancer Maternal Aunt     dx 9s; deceased 63  . Thyroid cancer Paternal Aunt     All 3 paternal aunts with thyroid ca in 30s/40s  . Lung cancer Paternal Aunt     2 of 3 paternal aunts with lung cancer    Allergies  Allergen Reactions  . Bee Venom Anaphylaxis  . Contrast Media [Iodinated Diagnostic Agents] Shortness Of Breath  . Iodine Other (See Comments)    CARDIAC ARREST  . Latex Anaphylaxis and Rash  . Penicillins Shortness Of Breath, Rash and Other (See Comments)    SEIZURE  . Shellfish Allergy Shortness Of Breath  . Aspirin Rash and Other (See Comments)    GI UPSET  . Erythromycin Swelling and Rash    SWELLING OF JOINTS  . Lidocaine Swelling    SWELLING OF MOUTH AND THROAT  . Symbicort [Budesonide-Formoterol Fumarate] Other (See Comments)    BURNING OF TONGUE AND LIPS  . Codeine Rash  . Compazine [Prochlorperazine Maleate] Rash    Rash on face,chest, arms, back  . Pentazocine Lactate Rash  . Sulfonamide Derivatives Rash    Current Outpatient Prescriptions on File Prior to Visit  Medication Sig Dispense Refill  . albuterol (PROAIR HFA) 108 (90 Base) MCG/ACT inhaler INHALE 2 PUFFS INTO THE LUNGS EVERY 6 HOURS AS NEEDED 25.5 g 1  . albuterol (PROVENTIL) (5 MG/ML) 0.5% nebulizer solution Take 2.5 mg by nebulization every 6 (six) hours as needed for wheezing or shortness of breath.    . Calcium Carbonate (CALCIUM 500 PO) Take 1 tablet by mouth daily.     . diphenhydrAMINE (BENADRYL) 25 MG tablet Take 1 tablet an hour before MRI.  Then take 1 tablet at bedtime as needed, per your usual regimen. 30 tablet 0  . DULoxetine (CYMBALTA) 60 MG capsule Take 1 capsule by mouth  daily 90 capsule 1  . EPINEPHrine 0.3 mg/0.3 mL IJ SOAJ injection Inject 0.3 mLs (0.3 mg total) into the muscle once. 2 Device 0  . Ergocalciferol (VITAMIN D2) 400  UNITS TABS Take 1 tablet by mouth daily.    . Fluticasone-Salmeterol (ADVAIR DISKUS) 250-50 MCG/DOSE AEPB Inhale 1 puff into the lungs 2 (two) times daily. 180 each 1  . Investigational palbociclib (IBRANCE) 125 MG capsule [facility] AFT-05 [name] Take 1 capsule (125 mg total) by mouth daily.  Take with food. Swallow whole. Do not chew. Take on days 1-21. Repeat every 28 days. 69 capsule 0  . levothyroxine (SYNTHROID, LEVOTHROID) 175 MCG tablet Take 1 tablet by mouth  daily . Take additional 1/2 tablet = 87.5 mcg on Monday and Friday (total 262.5  mcg) 102 tablet 1  . meclizine (ANTIVERT) 50 MG tablet Take 0.5 tablets (25 mg total) by mouth 3 (three) times daily as needed for dizziness or nausea. 21 tablet 0  . meloxicam (MOBIC) 7.5 MG tablet TAKE 1 TABLET BY MOUTH  DAILY 90 tablet 0  . mirabegron ER (MYRBETRIQ) 25 MG TB24 tablet Take 1 tablet (25 mg total) by mouth daily. 90 tablet 1  . Multiple Vitamin (MULTIVITAMIN) tablet Take 1 tablet by mouth daily.      Marland Kitchen neomycin-polymyxin-hydrocortisone (CORTISPORIN) otic solution Place 3 drops into the left ear 4 (four) times daily. 10 mL 0  . nitroGLYCERIN (NITROSTAT) 0.4 MG SL tablet Place 1 tablet (0.4 mg total) under the tongue every 5 (five) minutes as needed for chest pain. 30 tablet 0  . Omega-3 Fatty Acids (FISH OIL) 1200 MG CAPS Take 1 capsule by mouth daily.      . simvastatin (ZOCOR) 40 MG tablet TAKE 1 TABLET BY MOUTH  DAILY 90 tablet 0   No current facility-administered medications on file prior to visit.     BP 112/84 (BP Location: Left Arm, Patient Position: Sitting, Cuff Size: Large)   Pulse 62   Temp 98 F (36.7 C) (Oral)   Resp 16   Ht 5\' 8"  (1.727 m)   Wt 272 lb (123.4 kg)   SpO2 98%   BMI 41.36 kg/m       Objective:   Physical Exam  General  Mental Status - Alert. General Appearance - Well groomed. Not in acute distress.  Skin Rashes- No Rashes.  HEENT Head- Normal. Ear Auditory Canal - Left- Normal.  Right - Normal.Tympanic Membrane- Left- Normal. Right- Normal. Eye Sclera/Conjunctiva- Left- Normal. Right- Normal. Nose & Sinuses Nasal Mucosa- Left-  Boggy and Congested. Right-  Boggy and  Congested.Bilateral maxillary and frontal sinus pressure. Mouth & Throat Lips: Upper Lip- Normal: no dryness, cracking, pallor, cyanosis, or vesicular eruption. Lower Lip-Normal: no dryness, cracking, pallor, cyanosis or vesicular eruption. Buccal Mucosa- Bilateral- No Aphthous ulcers. Oropharynx- No Discharge or Erythema. Tonsils: Characteristics- Bilateral- No Erythema or Congestion. Size/Enlargement- Bilateral- No enlargement. Discharge- bilateral-None.  Neck Neck- Supple. No Masses.   Chest and Lung Exam Auscultation: Breath Sounds:-Clear even and unlabored.  Cardiovascular Auscultation:Rythm- Regular, rate and rhythm. Murmurs & Other Heart Sounds:Ausculatation of the heart reveal- No Murmurs.  Lymphatic Head & Neck General Head & Neck Lymphatics: Bilateral: Description- No Localized lymphadenopathy.       Assessment & Plan:   You appear to have bronchitis and sinusitis. Rest hydrate and tylenol for fever. I am prescribing cough medicine benzonatate, and doxycycline antibiotic. For your nasal congestion flonase.   Continue you inhalers for wheezing. If wheezing worsens then add taper dose prednisone.   If symptoms worsen or persist then get cxr and cbc.  Follow up in 7-10 days or as needed  Madisin Hasan, Percell Miller, [employer]

## 2016-05-26 NOTE — Patient Instructions (Signed)
You appear to have bronchitis and sinusitis. Rest hydrate and tylenol for fever. I am prescribing cough medicine benzonatate, and doxycycline antibiotic. For your nasal congestion flonase.   Continue you inhalers for wheezing. If wheezing worsens then add taper dose prednisone.   If symptoms worsen or persist then get cxr and cbc.  Follow up in 7-10 days or as needed

## 2016-06-01 DIAGNOSIS — G4733 Obstructive sleep apnea (adult) (pediatric): Secondary | ICD-10-CM | POA: Diagnosis not present

## 2016-06-07 ENCOUNTER — Encounter: Payer: Self-pay | Admitting: Family

## 2016-06-26 ENCOUNTER — Other Ambulatory Visit: Payer: Self-pay | Admitting: Hematology and Oncology

## 2016-06-26 DIAGNOSIS — G4733 Obstructive sleep apnea (adult) (pediatric): Secondary | ICD-10-CM | POA: Diagnosis not present

## 2016-06-26 DIAGNOSIS — Z9889 Other specified postprocedural states: Secondary | ICD-10-CM

## 2016-06-29 ENCOUNTER — Other Ambulatory Visit: Payer: Self-pay | Admitting: Hematology and Oncology

## 2016-06-29 ENCOUNTER — Ambulatory Visit
Admission: RE | Admit: 2016-06-29 | Discharge: 2016-06-29 | Disposition: A | Discharge status: Home / Self Care | Payer: 59 | Source: Ambulatory Visit | Attending: Hematology and Oncology | Admitting: Hematology and Oncology

## 2016-06-29 DIAGNOSIS — Z9889 Other specified postprocedural states: Secondary | ICD-10-CM

## 2016-06-29 DIAGNOSIS — N644 Mastodynia: Secondary | ICD-10-CM | POA: Diagnosis not present

## 2016-06-29 HISTORY — DX: Personal history of irradiation: Z92.3

## 2016-06-29 HISTORY — DX: Personal history of antineoplastic chemotherapy: Z92.21

## 2016-06-30 ENCOUNTER — Ambulatory Visit
Admission: RE | Admit: 2016-06-30 | Discharge: 2016-06-30 | Disposition: A | Discharge status: Home / Self Care | Payer: 59 | Source: Ambulatory Visit | Attending: Hematology and Oncology | Admitting: Hematology and Oncology

## 2016-06-30 DIAGNOSIS — N644 Mastodynia: Secondary | ICD-10-CM

## 2016-07-02 ENCOUNTER — Other Ambulatory Visit: Payer: Self-pay | Admitting: Family

## 2016-07-03 ENCOUNTER — Other Ambulatory Visit: Payer: 59

## 2016-07-03 ENCOUNTER — Encounter: Payer: Self-pay | Admitting: Hematology and Oncology

## 2016-07-03 ENCOUNTER — Ambulatory Visit (HOSPITAL_BASED_OUTPATIENT_CLINIC_OR_DEPARTMENT_OTHER): Payer: 59 | Admitting: Hematology and Oncology

## 2016-07-03 ENCOUNTER — Other Ambulatory Visit (HOSPITAL_BASED_OUTPATIENT_CLINIC_OR_DEPARTMENT_OTHER): Payer: 59

## 2016-07-03 ENCOUNTER — Encounter: Payer: Self-pay | Admitting: Medical Oncology

## 2016-07-03 VITALS — BP 150/81 | HR 77 | Temp 98.0°F | Resp 19 | Ht 68.0 in | Wt 283.0 lb

## 2016-07-03 DIAGNOSIS — I890 Lymphedema, not elsewhere classified: Secondary | ICD-10-CM

## 2016-07-03 DIAGNOSIS — Z170 Estrogen receptor positive status [ER+]: Principal | ICD-10-CM

## 2016-07-03 DIAGNOSIS — C50412 Malignant neoplasm of upper-outer quadrant of left female breast: Secondary | ICD-10-CM

## 2016-07-03 DIAGNOSIS — Z85850 Personal history of malignant neoplasm of thyroid: Secondary | ICD-10-CM | POA: Diagnosis not present

## 2016-07-03 DIAGNOSIS — Z006 Encounter for examination for normal comparison and control in clinical research program: Secondary | ICD-10-CM | POA: Diagnosis not present

## 2016-07-03 DIAGNOSIS — D701 Agranulocytosis secondary to cancer chemotherapy: Secondary | ICD-10-CM | POA: Diagnosis not present

## 2016-07-03 DIAGNOSIS — D0512 Intraductal carcinoma in situ of left breast: Secondary | ICD-10-CM

## 2016-07-03 LAB — CBC WITH DIFFERENTIAL/PLATELET
BASO%: 0.2 % (ref 0.0–2.0)
Basophils Absolute: 0 10*3/uL (ref 0.0–0.1)
EOS ABS: 0.1 10*3/uL (ref 0.0–0.5)
EOS%: 2.4 % (ref 0.0–7.0)
HCT: 40 % (ref 34.8–46.6)
HEMOGLOBIN: 13.1 g/dL (ref 11.6–15.9)
LYMPH%: 23.6 % (ref 14.0–49.7)
MCH: 28.4 pg (ref 25.1–34.0)
MCHC: 32.7 g/dL (ref 31.5–36.0)
MCV: 86.7 fL (ref 79.5–101.0)
MONO#: 0.5 10*3/uL (ref 0.1–0.9)
MONO%: 12.5 % (ref 0.0–14.0)
NEUT%: 61.3 % (ref 38.4–76.8)
NEUTROS ABS: 2.3 10*3/uL (ref 1.5–6.5)
PLATELETS: 282 10*3/uL (ref 145–400)
RBC: 4.62 10*6/uL (ref 3.70–5.45)
RDW: 16.8 % — AB (ref 11.2–14.5)
WBC: 3.7 10*3/uL — AB (ref 3.9–10.3)
lymph#: 0.9 10*3/uL (ref 0.9–3.3)

## 2016-07-03 LAB — COMPREHENSIVE METABOLIC PANEL
ALBUMIN: 3.7 g/dL (ref 3.5–5.0)
ALT: 67 U/L — AB (ref 0–55)
ANION GAP: 7 meq/L (ref 3–11)
AST: 40 U/L — ABNORMAL HIGH (ref 5–34)
Alkaline Phosphatase: 64 U/L (ref 40–150)
BILIRUBIN TOTAL: 0.39 mg/dL (ref 0.20–1.20)
BUN: 8.8 mg/dL (ref 7.0–26.0)
CO2: 29 meq/L (ref 22–29)
Calcium: 9.8 mg/dL (ref 8.4–10.4)
Chloride: 107 mEq/L (ref 98–109)
Creatinine: 0.9 mg/dL (ref 0.6–1.1)
EGFR: 67 mL/min/{1.73_m2} — ABNORMAL LOW (ref >90)
Glucose: 115 mg/dl (ref 70–140)
Potassium: 4.1 mEq/L (ref 3.5–5.1)
SODIUM: 143 meq/L (ref 136–145)
TOTAL PROTEIN: 6.4 g/dL (ref 6.4–8.3)

## 2016-07-03 MED ORDER — INV-PALBOCICLIB 125 MG CAPS #23 ALLIANCE FOUNDATION AFT-05 (PALLAS): 125.0000 mg | ORAL_CAPSULE | Freq: Every day | ORAL | 0 refills | Status: DC

## 2016-07-03 NOTE — Progress Notes (Signed)
[name]: Cycle 21 (22 & 23) Patient here today for labs and MD assessment prior to start of Cycle 21 on study with palbociclib. I met with patient after her lab appointment and during her vital sign assessment. Patient returned her study dispensed palbociclib bottles from cycle 18- empty, cycle 19- empty and cycle 20- empty, as well as drug diaries for palbociclib and her anti hormone medication, patient reports no missed days of either and confirms having no difficulty with self-administration. Empty study dispensed bottles given to PharmD  Kennith Center for documentation of return. Concomitant medications have been reviewed and changes have been noted and will be updated with study. Patient reports no new adverse events and denies; nausea, vomiting, bowel or bladder problems, fever, headaches. Patient also denies any worsening of her aches or pains outside of her baseline. Patient does report to have had an episode of cold like symptoms toward the end of December continuing into January of this year and this has since resolved with concomitant medications. Per Dr. Geralyn Flash assessment of patient and review of all labs, including NCS labs, patient cleared to start cycle 21. Patient was provided with IRT study assigned bottles # [id number], [id number], [id number] by PharmD Kennith Center and given to patient by this nurse, along with drug diaries for documentation of both palbociclib and her anti hormone therapy medication for the next three cycles and patient knows to start today. All patient's questions answered to her satisfaction, patient thanked for her time and continued support of study and was encouraged to call Dr. Lindi Adie or myself with any questions or concerns she may have.  Cycle 24 appt for labs and MD on 09/25/2016  Adele Dan, RN, BSN Clinical Research 07/03/2016 11:29 AM

## 2016-07-03 NOTE — Progress Notes (Signed)
Patient Care Team: Debbrah Alar, NP as PCP - General (Internal Medicine) Excell Seltzer, MD as Consulting Physician (General Surgery) Nicholas Lose, MD as Consulting Physician (Hematology and Oncology) [name], MD as Attending Physician (Radiation Oncology) Katheran James., MD (Endocrinology) Maxwell Marion, RN as Registered Nurse (Medical Oncology)  DIAGNOSIS:  Encounter Diagnoses  Name Primary?  . Malignant neoplasm of upper-outer quadrant of left breast in female, estrogen receptor positive ([place])   . Ductal carcinoma in situ (DCIS) of left breast Yes    SUMMARY OF ONCOLOGIC HISTORY:   Breast cancer of upper-outer quadrant of left female breast ([place])   12/22/2013 Mammogram    Left breast upper-outer quadrant 1.8 x 1.4 x 1.6 cm mass with left axillary lymph node measuring 3.6 mm      12/30/2013 Breast MRI    Dominant enhancing left upper outer quadrant irregular mass encompassing a confluent area of abnormal clumped nodular enhancement, 5.5 cm overall, 6 mm masslike enhancement central left breast        02/23/2014 Surgery    Left lumpectomy: Invasive ductal carcinoma grade 2 spending 3.8 cm intermediate grade DCIS with lymphovascular invasion margins negative one out of 4 SLN positive, ER 99%, PR 100%, HER-2 negative ratio 0.74, T2, N1, M0 stage IIB      03/19/2014 PET scan    No evidence of distant metastatic disease      03/31/2014 - 08/18/2014 Chemotherapy    Adjuvant chemotherapy with dose dense Adriamycin Cytoxan x4 followed by Abraxane weekly x12      08/28/2014 Imaging    Left brachial vein thrombus      09/11/2014 -  Radiation Therapy    Adjuvant radiation therapy      11/20/2014 -  Anti-estrogen oral therapy    Anastrozole 1 mg daily + Ibrance ([name] clinical trial)       CHIEF COMPLIANT: Follow-up on anastrozole with Palbociclib  INTERVAL HISTORY: Michelle Kennedy is a 61 year old with above-mentioned history of left breast cancer  treated with lumpectomy adjuvant chemotherapy and radiation. She is currently on antiestrogen therapy with anastrozole along with that she also takes Palbociclib on a clinical trial. She has had no toxicities from either of these treatments. Denies any hot flashes or myalgias.  REVIEW OF SYSTEMS:   Constitutional: Denies fevers, chills or abnormal weight loss Eyes: Denies blurriness of vision Ears, nose, mouth, throat, and face: Denies mucositis or sore throat Respiratory: Denies cough, dyspnea or wheezes Cardiovascular: Denies palpitation, chest discomfort Gastrointestinal:  Denies nausea, heartburn or change in bowel habits Skin: Denies abnormal skin rashes Lymphatics: Denies new lymphadenopathy or easy bruising Neurological:Denies numbness, tingling or new weaknesses Behavioral/Psych: Mood is stable, no new changes  Extremities: No lower extremity edema Breast:  denies any pain or lumps or nodules in either breasts All other systems were reviewed with the patient and are negative.  I have reviewed the past medical history, past surgical history, social history and family history with the patient and they are unchanged from previous note.  ALLERGIES:  is allergic to bee venom; contrast media [iodinated diagnostic agents]; iodine; latex; penicillins; shellfish allergy; aspirin; erythromycin; lidocaine; symbicort [budesonide-formoterol fumarate]; codeine; compazine [prochlorperazine maleate]; pentazocine lactate; and sulfonamide derivatives.  MEDICATIONS:  Current Outpatient Prescriptions  Medication Sig Dispense Refill  . albuterol (PROAIR HFA) 108 (90 Base) MCG/ACT inhaler INHALE 2 PUFFS INTO THE LUNGS EVERY 6 HOURS AS NEEDED 25.5 g 1  . albuterol (PROVENTIL) (5 MG/ML) 0.5% nebulizer solution Take 2.5 mg by  nebulization every 6 (six) hours as needed for wheezing or shortness of breath.    . anastrozole (ARIMIDEX) 1 MG tablet Take 1 mg by mouth daily.    . benzonatate (TESSALON) 100 MG  capsule Take 1 capsule (100 mg total) by mouth 3 (three) times daily as needed for cough. (Patient not taking: Reported on 07/03/2016) 21 capsule 0  . Calcium Carbonate (CALCIUM 500 PO) Take 1 tablet by mouth daily.     . diphenhydrAMINE (BENADRYL) 25 MG tablet Take 1 tablet an hour before MRI.  Then take 1 tablet at bedtime as needed, per your usual regimen. 30 tablet 0  . doxycycline (VIBRA-TABS) 100 MG tablet Take 1 tablet (100 mg total) by mouth 2 (two) times daily. (Patient not taking: Reported on 07/03/2016) 20 tablet 0  . DULoxetine (CYMBALTA) 60 MG capsule Take 1 capsule by mouth  daily 90 capsule 1  . EPINEPHrine 0.3 mg/0.3 mL IJ SOAJ injection Inject 0.3 mLs (0.3 mg total) into the muscle once. 2 Device 0  . Ergocalciferol (VITAMIN D2) 400 UNITS TABS Take 1 tablet by mouth daily.    . fluticasone (FLONASE) 50 MCG/ACT nasal spray Place 2 sprays into both nostrils daily. (Patient not taking: Reported on 07/03/2016) 16 g 1  . Fluticasone-Salmeterol (ADVAIR DISKUS) 250-50 MCG/DOSE AEPB Inhale 1 puff into the lungs 2 (two) times daily. 180 each 1  . Investigational palbociclib (IBRANCE) 125 MG capsule [facility] AFT-05 [name] Take 1 capsule (125 mg total) by mouth daily. Take with food. Swallow whole. Do not chew. Take on days 1-21. Repeat every 28 days. 69 capsule 0  . levothyroxine (SYNTHROID, LEVOTHROID) 175 MCG tablet Take 1 tablet by mouth  daily . Take additional 1/2 tablet = 87.5 mcg on Monday and Friday (total 262.5  mcg) 102 tablet 1  . meclizine (ANTIVERT) 50 MG tablet Take 0.5 tablets (25 mg total) by mouth 3 (three) times daily as needed for dizziness or nausea. 21 tablet 0  . meloxicam (MOBIC) 7.5 MG tablet TAKE 1 TABLET BY MOUTH  DAILY 90 tablet 0  . mirabegron ER (MYRBETRIQ) 25 MG TB24 tablet Take 1 tablet (25 mg total) by mouth daily. 90 tablet 1  . Multiple Vitamin (MULTIVITAMIN) tablet Take 1 tablet by mouth daily.      Marland Kitchen neomycin-polymyxin-hydrocortisone (CORTISPORIN)  otic solution Place 3 drops into the left ear 4 (four) times daily. 10 mL 0  . nitroGLYCERIN (NITROSTAT) 0.4 MG SL tablet Place 1 tablet (0.4 mg total) under the tongue every 5 (five) minutes as needed for chest pain. 30 tablet 0  . Omega-3 Fatty Acids (FISH OIL) 1200 MG CAPS Take 1 capsule by mouth daily.      . predniSONE (DELTASONE) 10 MG tablet 5 TAB PO DAY 1 4 TAB PO DAY 2 3 TAB PO DAY 3 2 TAB PO DAY 4 1 TAB PO DAY 5 (Patient not taking: Reported on 07/03/2016) 15 tablet 0  . simvastatin (ZOCOR) 40 MG tablet TAKE 1 TABLET BY MOUTH  DAILY 90 tablet 0   No current facility-administered medications for this visit.     PHYSICAL EXAMINATION: ECOG PERFORMANCE STATUS: 0 - Asymptomatic  Vitals:   07/03/16 1004  BP: (!) 150/81  Pulse: 77  Resp: 19  Temp: 98 F (36.7 C)   Filed Weights   07/03/16 1004  Weight: 283 lb (128.4 kg)    GENERAL:alert, no distress and comfortable SKIN: skin color, texture, turgor are normal, no rashes or significant lesions EYES: normal,  Conjunctiva are pink and non-injected, sclera clear OROPHARYNX:no exudate, no erythema and lips, buccal mucosa, and tongue normal  NECK: supple, thyroid normal size, non-tender, without nodularity LYMPH:  no palpable lymphadenopathy in the cervical, axillary or inguinal LUNGS: clear to auscultation and percussion with normal breathing effort HEART: regular rate & rhythm and no murmurs and no lower extremity edema ABDOMEN:abdomen soft, non-tender and normal bowel sounds MUSCULOSKELETAL:no cyanosis of digits and no clubbing  NEURO: alert & oriented x 3 with fluent speech, no focal motor/sensory deficits EXTREMITIES: No lower extremity edema  LABORATORY DATA:  I have reviewed the data as listed   Chemistry      Component Value Date/Time   NA 143 07/03/2016 0946   K 4.1 07/03/2016 0946   CL 103 09/16/2014 1140   CO2 29 07/03/2016 0946   BUN 8.8 07/03/2016 0946   CREATININE 0.9 07/03/2016 0946      Component Value  Date/Time   CALCIUM 9.8 07/03/2016 0946   ALKPHOS 64 07/03/2016 0946   AST 40 (H) 07/03/2016 0946   ALT 67 (H) 07/03/2016 0946   BILITOT 0.39 07/03/2016 0946       Lab Results  Component Value Date   WBC 3.7 (L) 07/03/2016   HGB 13.1 07/03/2016   HCT 40.0 07/03/2016   MCV 86.7 07/03/2016   PLT 282 07/03/2016   NEUTROABS 2.3 07/03/2016    ASSESSMENT & PLAN:  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 M0 stage IIB Adjuvant chemotherapy Completed 4 cycles of dose dense Adriamycin and Cytoxan. Followed by Abraxane X 12 completed 08/18/14, status post radiation completed June 2016, started anastrozole 1 mg daily 11/20/2014 (also enrolled on [name] clinical trial randomized to [facility])   Lymphedema left arm: Improved with physical therapy for lymphedema History of thyroid cancer:Low likelihood of recurrence  Current treatment: Adjuvant antiestrogen therapy with anastrozole once daily for 5 years started 11/20/2014 + Ibrance ([name] trial), today cycle 15day 1 Ibrance toxicities: 1. Grade 2 neutropenia: Did not require dose reduction, close monitoring of blood counts, ANC 2.3on 07/03/2016 2. Otherwise no major side effects to Ibrance 3. 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  continue the same dosage of Ibrance.  Survivorship: Patient is now swimming and exercising every day. Is staying active. I gave her information on live strong program.  Breast Cancer Surveillance: 1. Breast exam: 04/09/2016 Normal except for scar tissue related to prior surgery and radiation 2. Mammogram February 2018 Category B breast density. No mammographic abnormalities postsurgical changes.  RTC3 months for labs and follow-up   I spent 25 minutes talking to the patient of which more than half was spent in counseling  and coordination of care.  No orders of the defined types were placed in this encounter.  The patient has a good understanding of the overall plan. she agrees with it. she will call with any problems that may develop before the next visit here.   Rulon Eisenmenger, MD 07/03/16

## 2016-07-03 NOTE — Assessment & Plan Note (Signed)
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 M0 stage IIB Adjuvant chemotherapy Completed 4 cycles of dose dense Adriamycin and Cytoxan. Followed by Abraxane X 12 completed 08/18/14, status post radiation completed June 2016, started anastrozole 1 mg daily 11/20/2014 (also enrolled on [name] clinical trial randomized to [facility])   Lymphedema left arm: Improved with physical therapy for lymphedema History of thyroid cancer:Low likelihood of recurrence  Current treatment: Adjuvant antiestrogen therapy with anastrozole once daily for 5 years started 11/20/2014 + Ibrance ([name] trial), today cycle 15day 1 Ibrance toxicities: 1. Grade 2 neutropenia: Did not require dose reduction, close monitoring of blood counts, ANC 2.4on 01/17/16 2. Otherwise no major side effects to Ibrance 3. 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  continue the same dosage of Ibrance.  Survivorship: Patient is now swimming and exercising every day. Is staying active. I gave her information on live strong program.  Breast Cancer Surveillance: 1. Breast exam: 04/09/2016 Normal except for scar tissue related to prior surgery and radiation 2. Mammogram January 2017 Category B breast density. No mammographic abnormalities postsurgical changes.  RTC3 months for labs and follow-up

## 2016-07-04 ENCOUNTER — Encounter: Payer: Self-pay | Admitting: Medical Oncology

## 2016-07-04 DIAGNOSIS — C50412 Malignant neoplasm of upper-outer quadrant of left female breast: Secondary | ICD-10-CM

## 2016-07-04 DIAGNOSIS — Z170 Estrogen receptor positive status [ER+]: Principal | ICD-10-CM

## 2016-07-05 ENCOUNTER — Telehealth: Payer: Self-pay | Admitting: Hematology and Oncology

## 2016-07-05 NOTE — Telephone Encounter (Signed)
lvm to inform pt of 5/7 lab/ov at 10 am per LOS

## 2016-07-06 IMAGING — MG MM DIGITAL DIAGNOSTIC BILAT CAD
5 series · 5 of 5 positions shown · non-contrast
Comparison: 09/22/2010

CLINICAL DATA: Palpable lump in the upper outer left breast.

EXAM:
DIGITAL DIAGNOSTIC  BILATERAL MAMMOGRAM WITH CAD
ULTRASOUND LEFT BREAST

[L TAN]
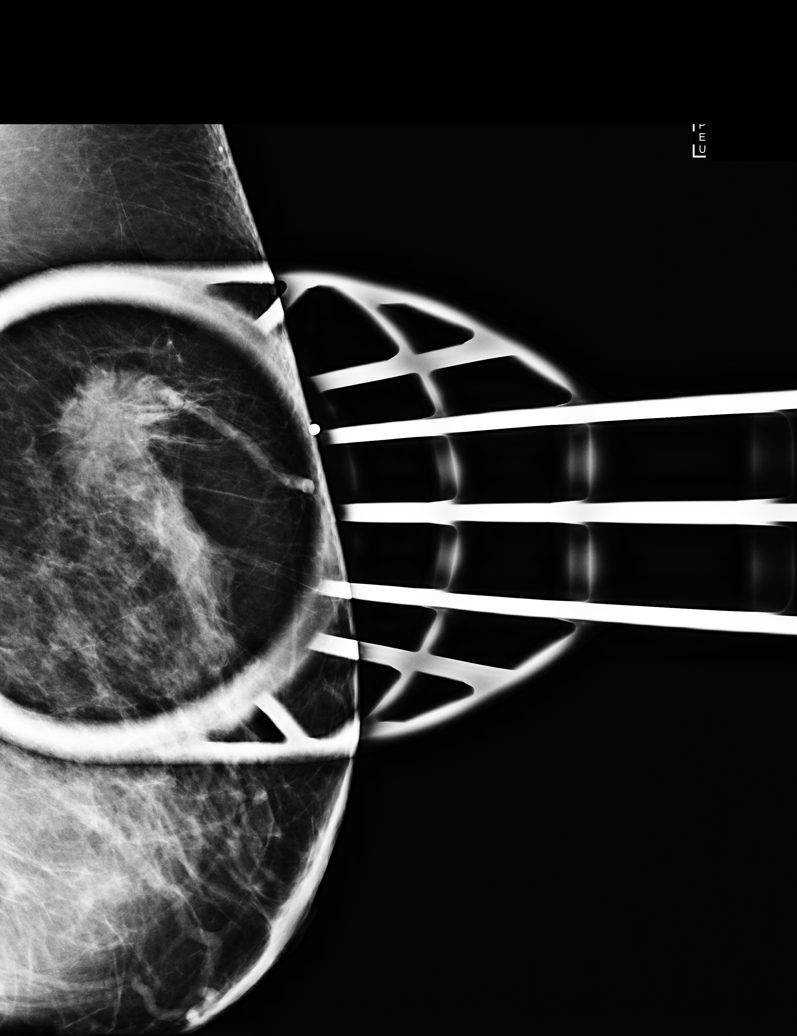

[R CC]
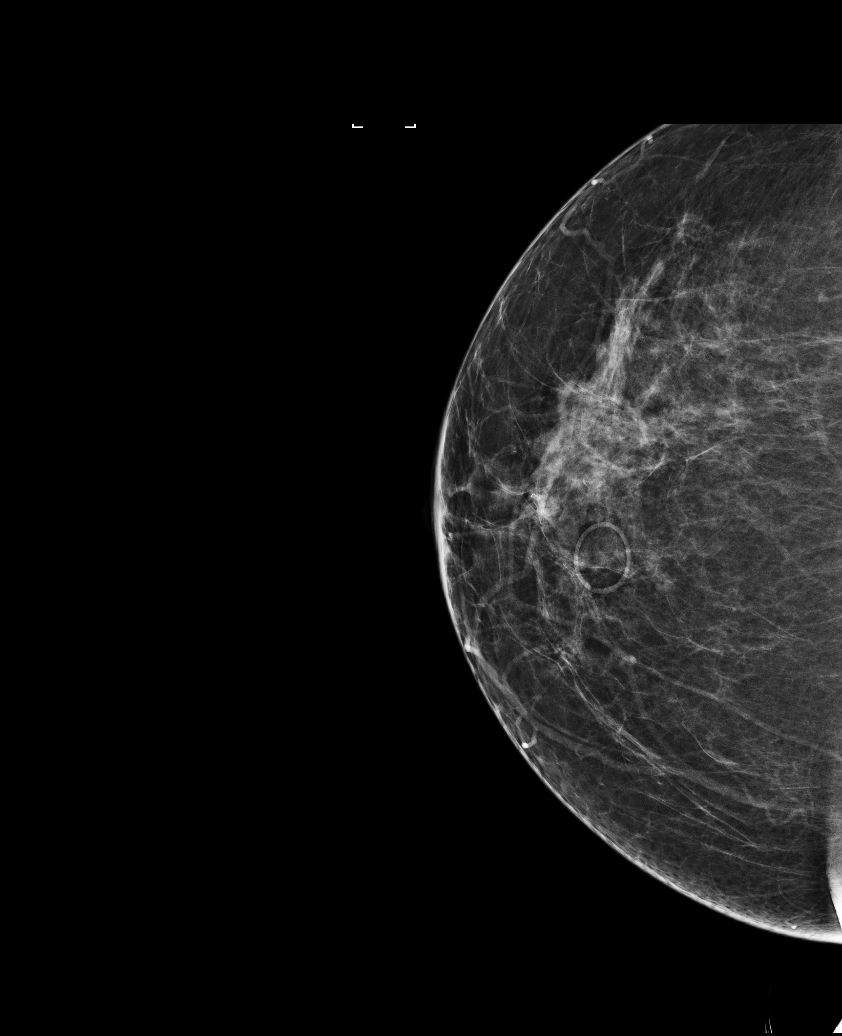

[L MLO]
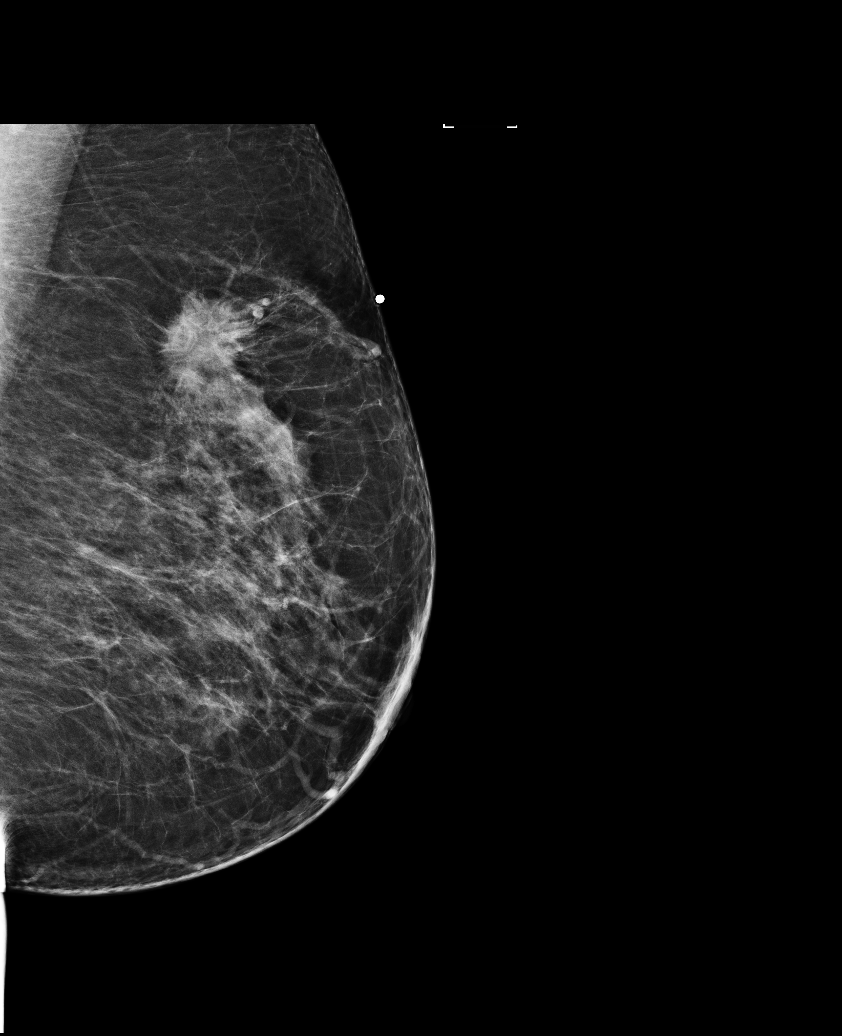

[R MLO]
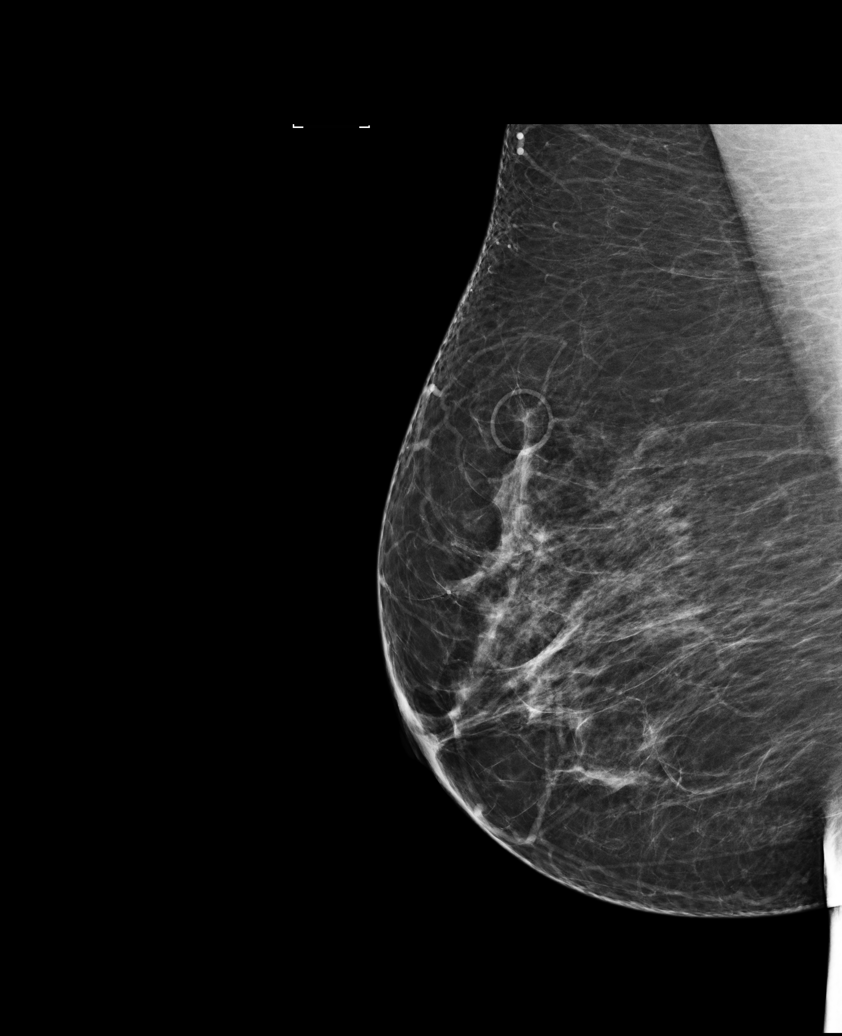

[L CC]
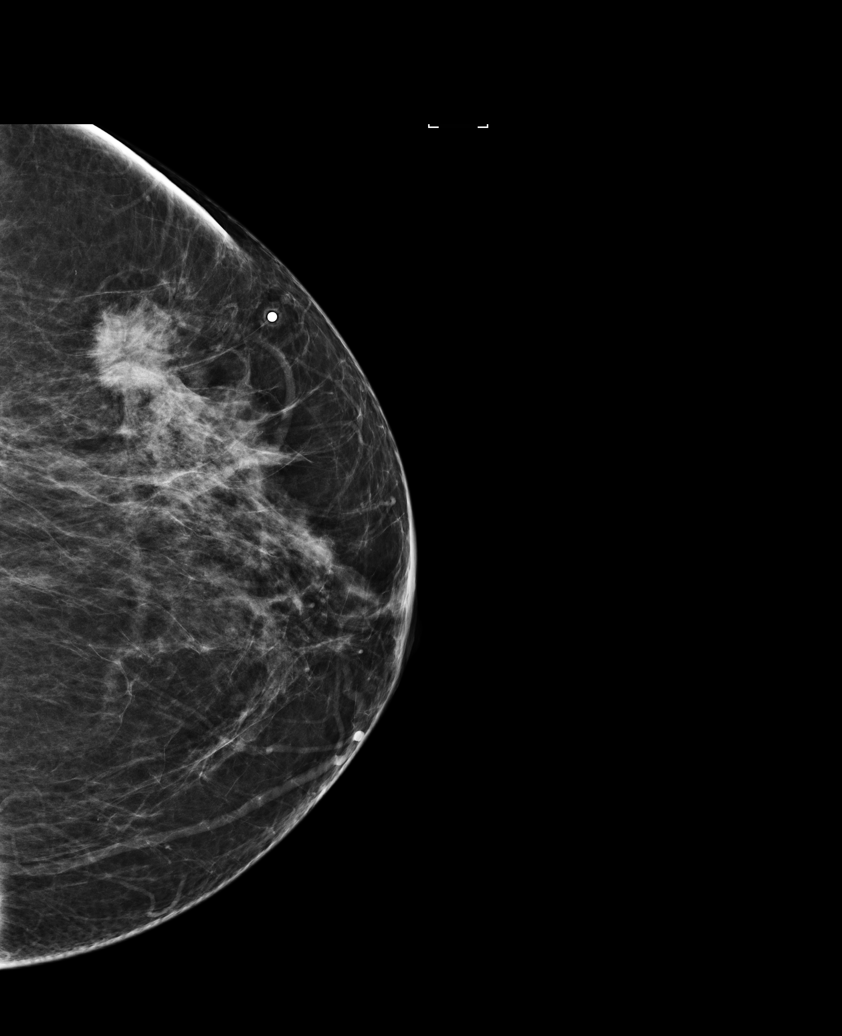

[5 of 5 positions shown; findings below may reference images not displayed]

ACR Breast Density Category b: There are scattered areas of
fibroglandular density.
FINDINGS: There is a spiculated mass in the upper outer left breast
corresponding to the palpable abnormality. It is new from the prior
exam. There are no other masses or areas of architectural
distortion. No suspicious calcifications.

Mammographic images were processed with CAD.

On physical exam, an irregular, firm and relatively fixed mass in
the upper outer left breast is palpated.

Ultrasound is performed, showing a hypoechoic, shadowing mass with
irregular, ill-defined margins in the 2 o'clock position of the left
breast, 10 cm from the nipple measuring 18 mm x 14 mm x 16 mm.

In the left axilla there is a single lymph node that has a portion
of its cortex which is mildly thickened measuring 3.6 mm.
IMPRESSION: Highly suspicious for malignancy. Suspicious left axillary lymph
node.

RECOMMENDATION:
Ultrasound-guided core needle biopsy of the upper outer left breast
mass and the suspicious lymph node. Patient will undergo this biopsy
today.

I have discussed the findings and recommendations with the patient.
Results were also provided in writing at the conclusion of the
visit. If applicable, a reminder letter will be sent to the patient
regarding the next appointment.

BI-RADS CATEGORY  5: Highly suggestive of malignancy.

## 2016-07-11 ENCOUNTER — Other Ambulatory Visit: Payer: Self-pay | Admitting: Family

## 2016-07-24 DIAGNOSIS — G4733 Obstructive sleep apnea (adult) (pediatric): Secondary | ICD-10-CM | POA: Diagnosis not present

## 2016-08-09 ENCOUNTER — Telehealth: Payer: Self-pay | Admitting: Family

## 2016-08-09 NOTE — Telephone Encounter (Signed)
Patient asked husband to let me know at his appointment that she would like to be referred to a weight loss specialist.

## 2016-08-16 ENCOUNTER — Encounter: Payer: Self-pay | Admitting: Family

## 2016-08-16 ENCOUNTER — Ambulatory Visit (INDEPENDENT_AMBULATORY_CARE_PROVIDER_SITE_OTHER): Payer: 59 | Admitting: Family

## 2016-08-16 VITALS — BP 142/86 | HR 77 | Temp 98.6°F | Resp 14 | Ht 68.0 in | Wt 278.4 lb

## 2016-08-16 DIAGNOSIS — J069 Acute upper respiratory infection, unspecified: Secondary | ICD-10-CM | POA: Diagnosis not present

## 2016-08-16 DIAGNOSIS — H1031 Unspecified acute conjunctivitis, right eye: Secondary | ICD-10-CM

## 2016-08-16 DIAGNOSIS — J029 Acute pharyngitis, unspecified: Secondary | ICD-10-CM

## 2016-08-16 DIAGNOSIS — B9789 Other viral agents as the cause of diseases classified elsewhere: Secondary | ICD-10-CM

## 2016-08-16 LAB — POCT RAPID STREP A (OFFICE): Rapid Strep A Screen: NEGATIVE

## 2016-08-16 LAB — POCT INFLUENZA A/B
INFLUENZA A, POC: NEGATIVE
Influenza B, POC: NEGATIVE

## 2016-08-16 MED ORDER — BENZONATATE 100 MG PO CAPS: 100.0000 mg | ORAL_CAPSULE | Freq: Three times a day (TID) | ORAL | 0 refills | Status: DC | PRN

## 2016-08-16 MED ORDER — CIPROFLOXACIN HCL 0.3 % OP SOLN: 1.0000 [drp] | OPHTHALMIC | 0 refills | Status: DC

## 2016-08-16 NOTE — Progress Notes (Signed)
Pre visit review using our clinic review tool, if applicable. No additional management support is needed unless otherwise documented below in the visit note. Symptoms

## 2016-08-16 NOTE — Patient Instructions (Signed)
Please begin ciloxan eye drops for your conjunctivitis. Your strep testing is negative.  Continue your nebulizer every 6 hours. For cough you may use tessalon as needed. Call if you develop recurrent temperature >101, shortness of breath or if you are not improved in 3 days.

## 2016-08-16 NOTE — Progress Notes (Signed)
Subjective:    Patient ID: Michelle Kennedy, female    DOB: May 01, 1956, 61 y.o.   MRN: [id number]  HPI  Michelle Kennedy is a 61 yr old female who presents today with chief complaint of sore throat. Began a few days ago.  Reports 1 week history cough. Developed eye drainage, irritation this AM.  Reports + fever, tmax 101-102.     Review of Systems See HPI  Past Medical History:  Diagnosis Date  . Abnormally small mouth   . Arthritis    hips and knees  . Asthma    daily inhaler, prn inhaler and neb.  . Breast cancer ([place]) 01/2014   left  . Dental bridge present    upper front and lower right  . Dental crowns present    x 3  . Family history of anesthesia complication    twin brother aspirated and died on OR table, per pt.  . Fibromyalgia   . GERD (gastroesophageal reflux disease)   . History of gastric ulcer    as a teenager  . History of seizure age 11   as a reaction to Penicillin - no seizures since  . History of thyroid cancer    s/p thyroidectomy  . Hyperlipidemia   . Hypothyroidism   . Migraines   . OSA (obstructive sleep apnea) 02/16/2016  . Personal history of chemotherapy 2015  . Personal history of radiation therapy 2015   Left  . UTI (lower urinary tract infection) 02/17/2014     Social History   Social History  . Marital status: Married    Spouse name: N/A  . Number of children: N/A  . Years of education: N/A   Occupational History  . student Unemployed   Social History Main Topics  . Smoking status: Never Smoker  . Smokeless tobacco: Never Used  . Alcohol use No  . Drug use: No  . Sexual activity: Not on file     Comment: menarche age 26, P 2, first birth age 48, menopause age 48, Premarin x 10 yrs   Other Topics Concern  . Not on file   Social History Narrative   Regular exercise: yes          Past Surgical History:  Procedure Laterality Date  . ABDOMINAL HYSTERECTOMY  2004   complete  . ACHILLES TENDON REPAIR Right   . APPENDECTOMY  2004   . BREAST LUMPECTOMY WITH NEEDLE LOCALIZATION AND AXILLARY SENTINEL LYMPH NODE BX Left 02/23/2014   Procedure: BREAST LUMPECTOMY WITH NEEDLE LOCALIZATION AND AXILLARY SENTINEL LYMPH NODE BIOPSY;  Surgeon: Excell Seltzer, MD;  Location: [place];  Service: General;  Laterality: Left;  . BREAST SURGERY  2011   left breast biposy  . CHOLECYSTECTOMY  1990  . COLONOSCOPY W/ POLYPECTOMY  06/2009  . EYE SURGERY Right 2013   exc. warts from underneath eyelid  . INCONTINENCE SURGERY  2004  . KNEE ARTHROSCOPY Bilateral    x 6 each knee  . LIGAMENT REPAIR Right    thumb/wrist  . ORIF TOE FRACTURE Right    great toe  . PORTACATH PLACEMENT N/A 03/24/2014   Procedure: INSERTION PORT-A-CATH;  Surgeon: Excell Seltzer, MD;  Location: [place];  Service: General;  Laterality: N/A;  . THYROIDECTOMY  2000  . TONSILLECTOMY AND ADENOIDECTOMY  2000  . TUMOR EXCISION     from thoracic spine    Family History  Problem Relation Age of Onset  . Alcohol abuse Mother   .  Arthritis Mother   . Hypertension Mother   . Bipolar disorder Mother   . Breast cancer Mother 39    unconfirmed  . Lung cancer Mother 19    smoker  . Alcohol abuse Father   . Cancer Father     lung  . Hyperlipidemia Father   . Kidney disease Father   . Diabetes Father   . Arthritis Maternal Grandmother   . Diabetes Paternal Grandmother   . Thyroid cancer Sister 68    type?; currently 25  . Other Sister     ovarian tumor @ 90; TAH/BSO  . Thyroid cancer Brother     dx 54s; currently 37  . Breast cancer Maternal Aunt     dx 5s; deceased 78  . Thyroid cancer Paternal Aunt     All 3 paternal aunts with thyroid ca in 30s/40s  . Lung cancer Paternal Aunt     2 of 3 paternal aunts with lung cancer    Allergies  Allergen Reactions  . Bee Venom Anaphylaxis  . Contrast Media [Iodinated Diagnostic Agents] Shortness Of Breath  . Iodine Other (See Comments)    CARDIAC ARREST  . Latex  Anaphylaxis and Rash  . Penicillins Shortness Of Breath, Rash and Other (See Comments)    SEIZURE  . Shellfish Allergy Shortness Of Breath  . Aspirin Rash and Other (See Comments)    GI UPSET  . Erythromycin Swelling and Rash    SWELLING OF JOINTS  . Lidocaine Swelling    SWELLING OF MOUTH AND THROAT  . Symbicort [Budesonide-Formoterol Fumarate] Other (See Comments)    BURNING OF TONGUE AND LIPS  . Codeine Rash  . Compazine [Prochlorperazine Maleate] Rash    Rash on face,chest, arms, back  . Pentazocine Lactate Rash  . Sulfonamide Derivatives Rash    Current Outpatient Prescriptions on File Prior to Visit  Medication Sig Dispense Refill  . albuterol (PROAIR HFA) 108 (90 Base) MCG/ACT inhaler INHALE 2 PUFFS INTO THE LUNGS EVERY 6 HOURS AS NEEDED 25.5 g 1  . albuterol (PROVENTIL) (5 MG/ML) 0.5% nebulizer solution Take 2.5 mg by nebulization every 6 (six) hours as needed for wheezing or shortness of breath.    . anastrozole (ARIMIDEX) 1 MG tablet Take 1 mg by mouth daily.    . Calcium Carbonate (CALCIUM 500 PO) Take 1 tablet by mouth daily.     . diphenhydrAMINE (BENADRYL) 25 MG tablet Take 1 tablet an hour before MRI.  Then take 1 tablet at bedtime as needed, per your usual regimen. 30 tablet 0  . DULoxetine (CYMBALTA) 60 MG capsule TAKE 1 CAPSULE BY MOUTH  DAILY 90 capsule 1  . EPINEPHrine 0.3 mg/0.3 mL IJ SOAJ injection Inject 0.3 mLs (0.3 mg total) into the muscle once. 2 Device 0  . Ergocalciferol (VITAMIN D2) 400 UNITS TABS Take 1 tablet by mouth daily.    . Fluticasone-Salmeterol (ADVAIR DISKUS) 250-50 MCG/DOSE AEPB Inhale 1 puff into the lungs 2 (two) times daily. 180 each 1  . Investigational palbociclib (IBRANCE) 125 MG capsule [facility] AFT-05 [name] Take 1 capsule (125 mg total) by mouth daily. Take with food. Swallow whole. Do not chew. Take on days 1-21. Repeat every 28 days. 69 capsule 0  . levothyroxine (SYNTHROID, LEVOTHROID) 175 MCG tablet Take 1 tablet by  mouth  daily . Take additional 1/2 tablet = 87.5 mcg on Monday and Friday (total 262.5  mcg) 102 tablet 1  . meclizine (ANTIVERT) 50 MG tablet Take 0.5 tablets (25  mg total) by mouth 3 (three) times daily as needed for dizziness or nausea. 21 tablet 0  . meloxicam (MOBIC) 7.5 MG tablet TAKE 1 TABLET BY MOUTH  DAILY 90 tablet 1  . Multiple Vitamin (MULTIVITAMIN) tablet Take 1 tablet by mouth daily.      Marland Kitchen neomycin-polymyxin-hydrocortisone (CORTISPORIN) otic solution Place 3 drops into the left ear 4 (four) times daily. 10 mL 0  . nitroGLYCERIN (NITROSTAT) 0.4 MG SL tablet Place 1 tablet (0.4 mg total) under the tongue every 5 (five) minutes as needed for chest pain. 30 tablet 0  . Omega-3 Fatty Acids (FISH OIL) 1200 MG CAPS Take 1 capsule by mouth daily.      . simvastatin (ZOCOR) 40 MG tablet TAKE 1 TABLET BY MOUTH  DAILY 90 tablet 1   No current facility-administered medications on file prior to visit.     BP (!) 142/86 (BP Location: Right Arm, Patient Position: Sitting, Cuff Size: Large)   Pulse 77   Temp 98.6 F (37 C) (Oral)   Resp 14   Ht 5\' 8"  (1.727 m)   Wt 278 lb 6.4 oz (126.3 kg)   SpO2 97%   BMI 42.33 kg/m       Objective:   Physical Exam  Constitutional: She is oriented to person, place, and time. She appears well-developed and well-nourished.  HENT:  Head: Normocephalic and atraumatic.  Right Ear: Tympanic membrane and ear canal normal.  Left Ear: Tympanic membrane and ear canal normal.  Mouth/Throat: No oropharyngeal exudate, posterior oropharyngeal edema or posterior oropharyngeal erythema.  Eyes: Right conjunctiva is injected. Left conjunctiva is not injected.  Cardiovascular: Normal rate, regular rhythm and normal heart sounds.   No murmur heard. Pulmonary/Chest: Effort normal. No respiratory distress. She has wheezes. She has no rhonchi. She has no rales.  Soft expiratory wheeze, cleared with cough.  Musculoskeletal: She exhibits no edema.  Lymphadenopathy:     She has no cervical adenopathy.  Neurological: She is alert and oriented to person, place, and time.  Psychiatric: She has a normal mood and affect. Her behavior is normal. Judgment and thought content normal.          Assessment & Plan:  Viral URI with cough- rapid strep is negative. Flu is negative. Husband had recent similar presentation. Rx tessalon prn cough, continue albuterol nebs every 6 hours.  Discussed supportive measures and follow up if symptoms worsen or fail to improve.   Conjunctivitis- rx with ciloxan

## 2016-08-23 ENCOUNTER — Other Ambulatory Visit: Payer: Self-pay | Admitting: Family

## 2016-08-23 MED ORDER — ALBUTEROL SULFATE HFA 108 (90 BASE) MCG/ACT IN AERS: INHALATION_SPRAY | RESPIRATORY_TRACT | 0 refills | Status: DC

## 2016-08-23 NOTE — Telephone Encounter (Signed)
Self. Refill request for albuterol. Pt would like to have one go to her local pharmacy because she is almost out and then have the rest sent to her mail order pharmacy.      Pharmacy: [id number] - [place], [place] - [address] AT [address]     Mail Order - [place], [place]

## 2016-08-23 NOTE — Telephone Encounter (Signed)
Refill sent per [facility] refill protocol/SLS  

## 2016-08-24 DIAGNOSIS — G4733 Obstructive sleep apnea (adult) (pediatric): Secondary | ICD-10-CM | POA: Diagnosis not present

## 2016-09-03 ENCOUNTER — Other Ambulatory Visit: Payer: Self-pay | Admitting: Family

## 2016-09-04 NOTE — Telephone Encounter (Signed)
Rx request Denied; Rx D/C by Oncology 07/04/16, change in therapy/SLS 04/16

## 2016-09-07 ENCOUNTER — Encounter (INDEPENDENT_AMBULATORY_CARE_PROVIDER_SITE_OTHER): Payer: Self-pay | Admitting: Family Medicine

## 2016-09-14 ENCOUNTER — Encounter (INDEPENDENT_AMBULATORY_CARE_PROVIDER_SITE_OTHER): Payer: Self-pay | Admitting: Family Medicine

## 2016-09-20 ENCOUNTER — Ambulatory Visit (INDEPENDENT_AMBULATORY_CARE_PROVIDER_SITE_OTHER): Payer: 59 | Admitting: Family Medicine

## 2016-09-20 ENCOUNTER — Encounter (INDEPENDENT_AMBULATORY_CARE_PROVIDER_SITE_OTHER): Payer: Self-pay | Admitting: Family Medicine

## 2016-09-20 VITALS — BP 143/87 | HR 71 | Temp 98.2°F | Resp 14 | Ht 68.0 in | Wt 274.0 lb

## 2016-09-20 DIAGNOSIS — R0602 Shortness of breath: Secondary | ICD-10-CM | POA: Diagnosis not present

## 2016-09-20 DIAGNOSIS — Z9189 Other specified personal risk factors, not elsewhere classified: Secondary | ICD-10-CM | POA: Diagnosis not present

## 2016-09-20 DIAGNOSIS — R5383 Other fatigue: Secondary | ICD-10-CM | POA: Diagnosis not present

## 2016-09-20 DIAGNOSIS — E559 Vitamin D deficiency, unspecified: Secondary | ICD-10-CM

## 2016-09-20 DIAGNOSIS — Z1389 Encounter for screening for other disorder: Secondary | ICD-10-CM | POA: Diagnosis not present

## 2016-09-20 DIAGNOSIS — Z6841 Body Mass Index (BMI) 40.0 and over, adult: Secondary | ICD-10-CM | POA: Insufficient documentation

## 2016-09-20 DIAGNOSIS — R030 Elevated blood-pressure reading, without diagnosis of hypertension: Secondary | ICD-10-CM | POA: Diagnosis not present

## 2016-09-20 DIAGNOSIS — Z1331 Encounter for screening for depression: Secondary | ICD-10-CM

## 2016-09-20 DIAGNOSIS — R7303 Prediabetes: Secondary | ICD-10-CM | POA: Diagnosis not present

## 2016-09-20 DIAGNOSIS — Z6839 Body mass index (BMI) 39.0-39.9, adult: Secondary | ICD-10-CM | POA: Insufficient documentation

## 2016-09-20 DIAGNOSIS — Z0289 Encounter for other administrative examinations: Secondary | ICD-10-CM

## 2016-09-20 NOTE — Progress Notes (Signed)
Office: [phone]  /  Fax: [phone]   Dear Debbrah Alar, NP,   Thank you for referring Michelle Kennedy to our clinic. The following note includes my evaluation and treatment recommendations.  HPI:   Chief Complaint: OBESITY  Michelle Kennedy has been referred by Debbrah Alar, NP for consultation regarding her obesity and obesity related comorbidities.  Michelle Kennedy (MR# [id number]) is a 61 y.o. female who presents on 09/20/2016 for obesity evaluation and treatment. Current BMI is Body mass index is 41.66 kg/m.Michelle Kennedy has struggled with obesity for years and has been unsuccessful in either losing weight or maintaining long term weight loss. Michelle Kennedy attended our information session and states she is currently in the action stage of change and ready to dedicate time achieving and maintaining a healthier weight.  Michelle Kennedy states her family eats meals together she thinks her family will eat healthier with  her her desired weight loss is 95.5 to 115.5 lbs her heaviest weight ever was 279 lbs. she skips meals frequently she is trying to eat 1/2 vegetarian she is frequently drinking liquids with calories she frequently makes poor food choices she frequently eats larger portions than normal  she has binge eating behaviors she struggles with emotional eating    Michelle Kennedy feels her energy is lower than it should be. This has worsened with weight gain and has not worsened recently. Michelle Kennedy admits to daytime somnolence and  denies waking up still tired. Patient is at risk for obstructive sleep apnea. Patent has a history of symptoms of daytime Michelle. Patient generally gets 7 or 8 hours of sleep per night, and states they generally have restful sleep. Snoring is not present. Apneic episodes are present. Epworth Sleepiness Score is 7  Dyspnea on exertion Michelle Kennedy notes increasing shortness of breath with exercising and seems to be worsening over time with weight gain. She  notes getting out of breath sooner with activity than she used to. This has not gotten worse recently. Michelle Kennedy denies orthopnea.  Vitamin D deficiency Michelle Kennedy has a diagnosis of vitamin D deficiency, no recent labs. She is not currently taking vit D. She admits Michelle and denies nausea, vomiting or muscle weakness.  Pre-Diabetes Michelle Kennedy has a diagnosis of prediabetes based on her elevated Hgb A1c last year of 5.7 with history of gestational diabetes and was informed this puts her at greater risk of developing diabetes. She is not taking metformin currently and continues to work on diet and exercise to decrease risk of diabetes. She admits polyphagia and denies nausea or hypoglycemia.  At risk for diabetes Michelle Kennedy is at higher than average risk for developing diabetes due to her obesity. She currently denies polyuria or polydipsia.  Elevated Blood Pressure without history of hypertension Michelle Kennedy has elevated blood pressure with no history of hypertension. She denies headache or chest pain, could be related to white coat syndrome.  Depression Screen Michelle Kennedy's Food and Mood (modified PHQ-9) score was  Depression screen PHQ 2/9 09/20/2016  Decreased Interest 3  Down, Depressed, Hopeless 1  PHQ - 2 Score 4  Altered sleeping 2  Tired, decreased energy 3  Change in appetite 1  Feeling bad or failure about yourself  0  Trouble concentrating 1  Moving slowly or fidgety/restless 0  Suicidal thoughts 0  PHQ-9 Score 11  Some recent data might be hidden    ALLERGIES: Allergies  Allergen Reactions  . Bee Venom Anaphylaxis  . Contrast Media [Iodinated Diagnostic Agents] Shortness Of Breath  . Iodine  Other (See Comments)    CARDIAC ARREST  . Latex Anaphylaxis and Rash  . Penicillins Shortness Of Breath, Rash and Other (See Comments)    SEIZURE  . Shellfish Allergy Shortness Of Breath  . Aspirin Rash and Other (See Comments)    GI UPSET  . Erythromycin Swelling and Rash    SWELLING OF  JOINTS  . Lidocaine Swelling    SWELLING OF MOUTH AND THROAT  . Symbicort [Budesonide-Formoterol Fumarate] Other (See Comments)    BURNING OF TONGUE AND LIPS  . Codeine Rash  . Compazine [Prochlorperazine Maleate] Rash    Rash on face,chest, arms, back  . Pentazocine Lactate Rash  . Sulfonamide Derivatives Rash    MEDICATIONS: Current Outpatient Prescriptions on File Prior to Visit  Medication Sig Dispense Refill  . albuterol (PROAIR HFA) 108 (90 Base) MCG/ACT inhaler INHALE 2 PUFFS INTO THE LUNGS EVERY 6 HOURS AS NEEDED 3 Inhaler 0  . albuterol (PROVENTIL) (5 MG/ML) 0.5% nebulizer solution Take 2.5 mg by nebulization every 6 (six) hours as needed for wheezing or shortness of breath.    . anastrozole (ARIMIDEX) 1 MG tablet Take 1 mg by mouth daily.    . benzonatate (TESSALON) 100 MG capsule Take 1 capsule (100 mg total) by mouth 3 (three) times daily as needed. 20 capsule 0  . Calcium Carbonate (CALCIUM 500 PO) Take 1 tablet by mouth daily.     . diphenhydrAMINE (BENADRYL) 25 MG tablet Take 1 tablet an hour before MRI.  Then take 1 tablet at bedtime as needed, per your usual regimen. 30 tablet 0  . DULoxetine (CYMBALTA) 60 MG capsule TAKE 1 CAPSULE BY MOUTH  DAILY 90 capsule 1  . EPINEPHrine 0.3 mg/0.3 mL IJ SOAJ injection Inject 0.3 mLs (0.3 mg total) into the muscle once. 2 Device 0  . Ergocalciferol (VITAMIN D2) 400 UNITS TABS Take 1 tablet by mouth daily.    . Fluticasone-Salmeterol (ADVAIR DISKUS) 250-50 MCG/DOSE AEPB Inhale 1 puff into the lungs 2 (two) times daily. 180 each 1  . Investigational palbociclib (IBRANCE) 125 MG capsule [facility] AFT-05 [name] Take 1 capsule (125 mg total) by mouth daily. Take with food. Swallow whole. Do not chew. Take on days 1-21. Repeat every 28 days. 69 capsule 0  . levothyroxine (SYNTHROID, LEVOTHROID) 175 MCG tablet Take 1 tablet by mouth  daily . Take additional 1/2 tablet = 87.5 mcg on Monday and Friday (total 262.5  mcg) 102 tablet 1    . meloxicam (MOBIC) 7.5 MG tablet TAKE 1 TABLET BY MOUTH  DAILY 90 tablet 1  . Multiple Vitamin (MULTIVITAMIN) tablet Take 1 tablet by mouth daily.      . nitroGLYCERIN (NITROSTAT) 0.4 MG SL tablet Place 1 tablet (0.4 mg total) under the tongue every 5 (five) minutes as needed for chest pain. 30 tablet 0  . Omega-3 Fatty Acids (FISH OIL) 1200 MG CAPS Take 1 capsule by mouth daily.      . simvastatin (ZOCOR) 40 MG tablet TAKE 1 TABLET BY MOUTH  DAILY 90 tablet 1  . ciprofloxacin (CILOXAN) 0.3 % ophthalmic solution Place 1 drop into both eyes every 2 (two) hours. Administer 1 drop, every 2 hours, while awake, for 2 days. Then 1 drop, every 4 hours, while awake, for the next 5 days. (Patient not taking: Reported on 09/20/2016) 5 mL 0  . meclizine (ANTIVERT) 50 MG tablet Take 0.5 tablets (25 mg total) by mouth 3 (three) times daily as needed for dizziness or nausea. 21  tablet 0  . neomycin-polymyxin-hydrocortisone (CORTISPORIN) otic solution Place 3 drops into the left ear 4 (four) times daily. (Patient not taking: Reported on 09/20/2016) 10 mL 0   No current facility-administered medications on file prior to visit.     PAST MEDICAL HISTORY: Past Medical History:  Diagnosis Date  . Abnormally small mouth   . Achilles tendon disorder, right   . Anemia   . Anxiety   . Arthritis    hips and knees  . Asthma    daily inhaler, prn inhaler and neb.  . Asthma due to environmental allergies   . Breast cancer ([place]) 01/2014   left  . Cancer ([place])   . Chest pain   . COPD (chronic obstructive pulmonary disease) ([place])   . Dental bridge present    upper front and lower right  . Dental crowns present    x 3  . Depression   . Family history of anesthesia complication    twin brother aspirated and died on OR table, per pt.  . Fibromyalgia   . Gallbladder disease   . GERD (gastroesophageal reflux disease)   . H/O blood clots   . History of gastric ulcer    as a teenager  . History of seizure age 3    as a reaction to Penicillin - no seizures since  . History of stomach ulcers   . History of thyroid cancer    s/p thyroidectomy  . Hyperlipidemia   . Hypothyroidism   . Joint pain   . Left knee injury   . Liver disorder   . Lymphedema    left arm  . Menopause   . Migraines   . Multiple food allergies   . Obesity   . OSA (obstructive sleep apnea) 02/16/2016  . Palpitations   . Personal history of chemotherapy 2015  . Personal history of radiation therapy 2015   Left  . Prediabetes   . Rheumatoid arthritis ([place])   . SOB (shortness of breath)   . Swelling of both ankles   . Tinnitus   . UTI (lower urinary tract infection) 02/17/2014  . Vitamin D deficiency     PAST SURGICAL HISTORY: Past Surgical History:  Procedure Laterality Date  . ABDOMINAL HYSTERECTOMY  2004   complete  . ACHILLES TENDON REPAIR Right   . APPENDECTOMY  2004  . BREAST LUMPECTOMY WITH NEEDLE LOCALIZATION AND AXILLARY SENTINEL LYMPH NODE BX Left 02/23/2014   Procedure: BREAST LUMPECTOMY WITH NEEDLE LOCALIZATION AND AXILLARY SENTINEL LYMPH NODE BIOPSY;  Surgeon: Excell Seltzer, MD;  Location: [facility];  Service: General;  Laterality: Left;  . BREAST SURGERY  2011   left breast biposy  . CHOLECYSTECTOMY  1990  . COLONOSCOPY W/ POLYPECTOMY  06/2009  . EYE SURGERY Right 2013   exc. warts from underneath eyelid  . INCONTINENCE SURGERY  2004  . KNEE ARTHROSCOPY Bilateral    x 6 each knee  . LIGAMENT REPAIR Right    thumb/wrist  . ORIF TOE FRACTURE Right    great toe  . PORTACATH PLACEMENT N/A 03/24/2014   Procedure: INSERTION PORT-A-CATH;  Surgeon: Excell Seltzer, MD;  Location: [facility];  Service: General;  Laterality: N/A;  . THYROIDECTOMY  2000  . TONSILLECTOMY AND ADENOIDECTOMY  2000  . TUMOR EXCISION     from thoracic spine    SOCIAL HISTORY: Social History  Substance Use Topics  . Smoking status: Never Smoker  . Smokeless tobacco: Never Used  .  Alcohol use No    FAMILY HISTORY: Family History  Problem Relation Age of Onset  . Alcohol abuse Mother   . Arthritis Mother   . Hypertension Mother   . Bipolar disorder Mother   . Breast cancer Mother 52    unconfirmed  . Lung cancer Mother 21    smoker  . Hyperlipidemia Mother   . Stroke Mother   . Cancer Mother   . Depression Mother   . Anxiety disorder Mother   . Alcoholism Mother   . Drug abuse Mother   . Eating disorder Mother   . Obesity Mother   . Alcohol abuse Father   . Cancer Father     lung  . Hyperlipidemia Father   . Kidney disease Father   . Diabetes Father   . Hypertension Father   . Cystic kidney disease Father   . Thyroid disease Father   . Liver disease Father   . Alcoholism Father   . Arthritis Maternal Grandmother   . Diabetes Paternal Grandmother   . Thyroid cancer Sister 84    type?; currently 64  . Other Sister     ovarian tumor @ 18; TAH/BSO  . Thyroid cancer Brother     dx 25s; currently 86  . Breast cancer Maternal Aunt     dx 26s; deceased 48  . Thyroid cancer Paternal Aunt     All 3 paternal aunts with thyroid ca in 30s/40s  . Lung cancer Paternal Aunt     2 of 3 paternal aunts with lung cancer    ROS: Review of Systems  Constitutional: Positive for malaise/Michelle.       Pain/Left Breast  HENT: Positive for congestion (nasal stuffiness), ear pain, hearing loss (decreased hearing), sinus pain and tinnitus.        Nasal discharge Hay Fever Swollen Glands/Neck   Eyes: Positive for blurred vision (or double vision).       Vision Changes Wear Glasses or Contacts Floaters  Respiratory: Positive for cough, shortness of breath and wheezing.        Difficulty Breathing  Cardiovascular: Positive for chest pain (/discomfort) and palpitations. Negative for orthopnea.       Chest tightness Shortness of breath with activity Difficulty breathing while lying down Sudden Awakening from Sleep with Shortness of Breath Calf/Leg Pain with  Walking Leg Cramping  Gastrointestinal: Positive for heartburn and nausea. Negative for vomiting.  Genitourinary: Positive for frequency.  Musculoskeletal: Positive for back pain, joint pain and myalgias.       Neck Stiffness Muscle Stiffness Red or Swollen Joints Negative muscle weakness  Skin: Positive for itching.       Dryness Hair or Nail changes   Neurological: Positive for weakness. Negative for headaches.  Endo/Heme/Allergies: Negative for polydipsia. Bruises/bleeds easily (Easy Bruising).       Heat/Cold Intolerance Polyphagia Negative hypoglycemia  Psychiatric/Behavioral: Positive for depression. The patient is nervous/anxious (Nervousness).     PHYSICAL EXAM: Blood pressure (!) 143/87, pulse 71, temperature 98.2 F (36.8 C), temperature source Oral, resp. rate 14, height 5\' 8"  (1.727 m), weight 274 lb (124.3 kg), SpO2 94 %. Body mass index is 41.66 kg/m. Physical Exam  Constitutional: She is oriented to person, place, and time. She appears well-developed and well-nourished.  Cardiovascular: Normal rate.   Pulmonary/Chest: Effort normal.  Musculoskeletal: Normal range of motion.  Neurological: She is oriented to person, place, and time.  Skin: Skin is warm and dry.  Vitals reviewed.   RECENT LABS AND  TESTS: BMET    Component Value Date/Time   NA 143 07/03/2016 0946   K 4.1 07/03/2016 0946   CL 103 09/16/2014 1140   CO2 29 07/03/2016 0946   GLUCOSE 115 07/03/2016 0946   BUN 8.8 07/03/2016 0946   CREATININE 0.9 07/03/2016 0946   CALCIUM 9.8 07/03/2016 0946   GFRNONAA >90 09/16/2014 1140   GFRAA >90 09/16/2014 1140   Lab Results  Component Value Date   HGBA1C 5.7 (H) 04/10/2016   No results found for: INSULIN CBC    Component Value Date/Time   WBC 3.7 (L) 07/03/2016 0946   WBC 4.0 09/13/2015 1414   RBC 4.62 07/03/2016 0946   RBC 4.90 09/13/2015 1414   HGB 13.1 07/03/2016 0946   HCT 40.0 07/03/2016 0946   PLT 282 07/03/2016 0946   MCV 86.7  07/03/2016 0946   MCH 28.4 07/03/2016 0946   MCH 26.7 09/16/2014 1140   MCHC 32.7 07/03/2016 0946   MCHC 32.7 09/13/2015 1414   RDW 16.8 (H) 07/03/2016 0946   LYMPHSABS 0.9 07/03/2016 0946   MONOABS 0.5 07/03/2016 0946   EOSABS 0.1 07/03/2016 0946   BASOSABS 0.0 07/03/2016 0946   Iron/TIBC/Ferritin/ %Sat No results found for: IRON, TIBC, FERRITIN, IRONPCTSAT Lipid Panel     Component Value Date/Time   CHOL 177 01/17/2016 1547   TRIG 165.0 (H) 01/17/2016 1547   HDL 56.30 01/17/2016 1547   CHOLHDL 3 01/17/2016 1547   VLDL 33.0 01/17/2016 1547   LDLCALC 88 01/17/2016 1547   LDLDIRECT 102.0 07/08/2014 1107   Hepatic Function Panel     Component Value Date/Time   PROT 6.4 07/03/2016 0946   ALBUMIN 3.7 07/03/2016 0946   AST 40 (H) 07/03/2016 0946   ALT 67 (H) 07/03/2016 0946   ALKPHOS 64 07/03/2016 0946   BILITOT 0.39 07/03/2016 0946   BILIDIR 0.1 08/11/2013 0918   IBILI 0.3 08/11/2013 0918      Component Value Date/Time   TSH 0.17 (L) 07/08/2014 1107   TSH 0.084 (L) 09/24/2013 1122   TSH 1.740 04/05/2011 0945    ECG  shows NSR with a rate of 75 BPM INDIRECT CALORIMETER done today shows a VO2 of 288 and a REE of 2008.    ASSESSMENT AND PLAN: Other Michelle - Plan: EKG 12-Lead, Vitamin B12, CBC With Differential, Comprehensive metabolic panel, Folate, Lipid Panel With LDL/HDL Ratio, T3, T4, free, TSH  Shortness of breath on exertion  Prediabetes - Plan: Hemoglobin A1c, Insulin, random  Blood pressure elevated without history of HTN  Depression screening  At risk for diabetes mellitus  Vitamin D deficiency - Plan: VITAMIN D 25 Hydroxy (Vit-D Deficiency, Fractures)  Morbid obesity (HCC)  PLAN: Michelle Michelle Kennedy was informed that her Michelle may be related to obesity, depression or many other causes. Labs will be ordered, and in the meanwhile Michelle Kennedy has agreed to work on diet, exercise and weight loss to help with Michelle. Proper sleep hygiene was discussed  including the need for 7-8 hours of quality sleep each night. A sleep study was not ordered based on symptoms and Epworth score.  Dyspnea on exertion Michelle Kennedy's shortness of breath appears to be obesity related and exercise induced. She has agreed to work on weight loss and gradually increase exercise to treat her exercise induced shortness of breath. If Harriett follows our instructions and loses weight without improvement of her shortness of breath, we will plan to refer to pulmonology. We will monitor this condition regularly. Michelle Kennedy agrees to this plan.  Vitamin D Deficiency Michelle Kennedy was informed that low vitamin D levels contributes to Michelle and are associated with obesity, breast, and colon cancer. She agrees to continue to take prescription Vit D @50 ,000 IU every week and will follow up for routine testing of vitamin D, at least 2-3 times per year. She was informed of the risk of over-replacement of vitamin D and agrees to not increase her dose unless he discusses this with [place] first.  Pre-Diabetes Michelle Kennedy will continue to work on weight loss, exercise, and decreasing simple carbohydrates in her diet to help decrease the risk of diabetes. We dicussed metformin including benefits and risks. She was informed that eating too many simple carbohydrates or too many calories at one sitting increases the likelihood of GI side effects. Michelle Kennedy declined metformin for now and a prescription was not written today. Michelle Kennedy agreed to follow up with [place] as directed to monitor her progress.  Diabetes risk counselling Michelle Kennedy was given extended (at least 15 minutes) diabetes prevention counseling today. She is 61 y.o. female and has risk factors for diabetes including obesity and pre-diabetes. We discussed intensive lifestyle modifications today with an emphasis on weight loss as well as increasing exercise and decreasing simple carbohydrates in her diet.  Elevated Blood Pressure without history of  hypertension We will re-check blood pressure in 2 weeks. Michelle Kennedy agrees to work on diet, exercise and weight loss in the meanwhile and follow up with our clinic in 2 weeks.  Depression Screen Michelle Kennedy had a moderately positive depression screening. Depression is commonly associated with obesity and often results in emotional eating behaviors. We will monitor this closely and work on CBT to help improve the non-hunger eating patterns. Referral to Psychology may be required if no improvement is seen as she continues in our clinic.  Obesity Talasia is currently in the action stage of change and her goal is to continue with weight loss efforts. I recommend Tzirel begin the structured treatment plan as follows:  She has agreed to follow our protein rich vegetarian plan Tylor has been instructed to eventually work up to a goal of 150 minutes of combined cardio and strengthening exercise per week for weight loss and overall health benefits. We discussed the following Behavioral Modification Stratagies today: increasing lean protein intake, work on meal planning and easy cooking plans and dealing with family or coworker sabotage  Sofiya has agreed to join our obesity program and follow up with our clinic in 2 weeks. She was informed of the importance of frequent follow up visits to maximize her success with intensive lifestyle modifications for her multiple health conditions. She was informed we would discuss her lab results at her next visit unless there is a critical issue that needs to be addressed sooner. Maley agreed to keep her next visit at the agreed upon time to discuss these results.  I, Doreene Nest, am acting as scribe for Dennard Nip, MD  I have reviewed the above documentation for accuracy and completeness, and I agree with the above. -Dennard Nip, MD

## 2016-09-21 ENCOUNTER — Other Ambulatory Visit: Payer: Self-pay | Admitting: Medical Oncology

## 2016-09-21 DIAGNOSIS — C50412 Malignant neoplasm of upper-outer quadrant of left female breast: Secondary | ICD-10-CM

## 2016-09-21 DIAGNOSIS — Z170 Estrogen receptor positive status [ER+]: Principal | ICD-10-CM

## 2016-09-21 LAB — CBC WITH DIFFERENTIAL
BASOS: 2 %
Basophils Absolute: 0 10*3/uL (ref 0.0–0.2)
EOS (ABSOLUTE): 0 10*3/uL (ref 0.0–0.4)
EOS: 1 %
HEMATOCRIT: 40.8 % (ref 34.0–46.6)
Hemoglobin: 12.9 g/dL (ref 11.1–15.9)
Immature Grans (Abs): 0 10*3/uL (ref 0.0–0.1)
Immature Granulocytes: 0 %
LYMPHS ABS: 0.8 10*3/uL (ref 0.7–3.1)
Lymphs: 33 %
MCH: 27.2 pg (ref 26.6–33.0)
MCHC: 31.6 g/dL (ref 31.5–35.7)
MCV: 86 fL (ref 79–97)
MONOS ABS: 0.2 10*3/uL (ref 0.1–0.9)
Monocytes: 7 %
NEUTROS ABS: 1.3 10*3/uL — AB (ref 1.4–7.0)
Neutrophils: 57 %
RBC: 4.75 x10E6/uL (ref 3.77–5.28)
RDW: 16.4 % — AB (ref 12.3–15.4)
WBC: 2.3 10*3/uL — AB (ref 3.4–10.8)

## 2016-09-21 LAB — COMPREHENSIVE METABOLIC PANEL
A/G RATIO: 1.7 (ref 1.2–2.2)
ALT: 39 IU/L — ABNORMAL HIGH (ref 0–32)
AST: 30 IU/L (ref 0–40)
Albumin: 4.2 g/dL (ref 3.6–4.8)
Alkaline Phosphatase: 60 IU/L (ref 39–117)
BILIRUBIN TOTAL: 0.4 mg/dL (ref 0.0–1.2)
BUN/Creatinine Ratio: 13 (ref 12–28)
BUN: 12 mg/dL (ref 8–27)
CHLORIDE: 100 mmol/L (ref 96–106)
CO2: 29 mmol/L (ref 18–29)
Calcium: 10.1 mg/dL (ref 8.7–10.3)
Creatinine, Ser: 0.92 mg/dL (ref 0.57–1.00)
GFR, EST AFRICAN AMERICAN: 77 mL/min/{1.73_m2} (ref >59)
GFR, EST NON AFRICAN AMERICAN: 67 mL/min/{1.73_m2} (ref >59)
GLOBULIN, TOTAL: 2.5 g/dL (ref 1.5–4.5)
Glucose: 93 mg/dL (ref 65–99)
POTASSIUM: 4.6 mmol/L (ref 3.5–5.2)
SODIUM: 142 mmol/L (ref 134–144)
TOTAL PROTEIN: 6.7 g/dL (ref 6.0–8.5)

## 2016-09-21 LAB — HEMOGLOBIN A1C
Est. average glucose Bld gHb Est-mCnc: 114 mg/dL
Hgb A1c MFr Bld: 5.6 % (ref 4.8–5.6)

## 2016-09-21 LAB — LIPID PANEL WITH LDL/HDL RATIO
Cholesterol, Total: 177 mg/dL (ref 100–199)
HDL: 70 mg/dL (ref >39)
LDL CALC: 78 mg/dL (ref 0–99)
LDL/HDL RATIO: 1.1 ratio (ref 0.0–3.2)
TRIGLYCERIDES: 143 mg/dL (ref 0–149)
VLDL Cholesterol Cal: 29 mg/dL (ref 5–40)

## 2016-09-21 LAB — INSULIN, RANDOM: INSULIN: 21.8 u[IU]/mL (ref 2.6–24.9)

## 2016-09-21 LAB — T3: T3, Total: 130 ng/dL (ref 71–180)

## 2016-09-21 LAB — VITAMIN B12: Vitamin B-12: 445 pg/mL (ref 232–1245)

## 2016-09-21 LAB — FOLATE: Folate: >20 ng/mL (ref >3.0)

## 2016-09-21 LAB — TSH: TSH: 5.6 u[IU]/mL — ABNORMAL HIGH (ref 0.450–4.500)

## 2016-09-21 LAB — VITAMIN D 25 HYDROXY (VIT D DEFICIENCY, FRACTURES): Vit D, 25-Hydroxy: 41.4 ng/mL (ref 30.0–100.0)

## 2016-09-21 LAB — T4, FREE: Free T4: 1.16 ng/dL (ref 0.82–1.77)

## 2016-09-23 DIAGNOSIS — G4733 Obstructive sleep apnea (adult) (pediatric): Secondary | ICD-10-CM | POA: Diagnosis not present

## 2016-09-25 ENCOUNTER — Other Ambulatory Visit (HOSPITAL_BASED_OUTPATIENT_CLINIC_OR_DEPARTMENT_OTHER): Payer: 59

## 2016-09-25 ENCOUNTER — Ambulatory Visit (HOSPITAL_BASED_OUTPATIENT_CLINIC_OR_DEPARTMENT_OTHER): Payer: 59 | Admitting: Hematology and Oncology

## 2016-09-25 ENCOUNTER — Other Ambulatory Visit: Payer: Self-pay | Admitting: Medical Oncology

## 2016-09-25 ENCOUNTER — Encounter: Payer: Self-pay | Admitting: Medical Oncology

## 2016-09-25 ENCOUNTER — Encounter: Payer: Self-pay | Admitting: Hematology and Oncology

## 2016-09-25 ENCOUNTER — Telehealth (INDEPENDENT_AMBULATORY_CARE_PROVIDER_SITE_OTHER): Payer: Self-pay | Admitting: Family Medicine

## 2016-09-25 DIAGNOSIS — Z006 Encounter for examination for normal comparison and control in clinical research program: Secondary | ICD-10-CM

## 2016-09-25 DIAGNOSIS — C50412 Malignant neoplasm of upper-outer quadrant of left female breast: Secondary | ICD-10-CM

## 2016-09-25 DIAGNOSIS — Z170 Estrogen receptor positive status [ER+]: Secondary | ICD-10-CM

## 2016-09-25 DIAGNOSIS — I890 Lymphedema, not elsewhere classified: Secondary | ICD-10-CM | POA: Diagnosis not present

## 2016-09-25 DIAGNOSIS — D701 Agranulocytosis secondary to cancer chemotherapy: Secondary | ICD-10-CM | POA: Diagnosis not present

## 2016-09-25 LAB — CBC WITH DIFFERENTIAL/PLATELET
BASO%: 0.3 % (ref 0.0–2.0)
Basophils Absolute: 0 10*3/uL (ref 0.0–0.1)
EOS%: 1.6 % (ref 0.0–7.0)
Eosinophils Absolute: 0 10*3/uL (ref 0.0–0.5)
HCT: 40.5 % (ref 34.8–46.6)
HGB: 13.2 g/dL (ref 11.6–15.9)
LYMPH%: 37.9 % (ref 14.0–49.7)
MCH: 28.2 pg (ref 25.1–34.0)
MCHC: 32.6 g/dL (ref 31.5–36.0)
MCV: 86.5 fL (ref 79.5–101.0)
MONO#: 0.4 10*3/uL (ref 0.1–0.9)
MONO%: 14.5 % — AB (ref 0.0–14.0)
NEUT#: 1.1 10*3/uL — ABNORMAL LOW (ref 1.5–6.5)
NEUT%: 45.7 % (ref 38.4–76.8)
PLATELETS: 236 10*3/uL (ref 145–400)
RBC: 4.69 10*6/uL (ref 3.70–5.45)
RDW: 16.4 % — ABNORMAL HIGH (ref 11.2–14.5)
WBC: 2.5 10*3/uL — ABNORMAL LOW (ref 3.9–10.3)
lymph#: 0.9 10*3/uL (ref 0.9–3.3)

## 2016-09-25 NOTE — Telephone Encounter (Signed)
Pt came in the office stating since she has started her program she has been extremley constipated. Last bm was Saturday. States it usually is daily. She wants to know what to do differently or change. Please call [phone] Pt is on Vegetarian plan

## 2016-09-25 NOTE — Progress Notes (Signed)
[name]: Cycle 24 (25 & 26) Myself and research nurse, Foye Spurling, met with patient after her labs this morning and PRO's completed. Patient here today for start of Cycle 24. Today CBC w/diff drawn only, due to patient already having CMET drawn, within study allowed time frame, on May 2nd during another MD appointment. V/S stable. Patient returned her completed medication diaries for cycle 21, 22 and 23 and reports no missed days of either palbociclib or her anti-hormone medication. Patient also returned study dispensed bottles from cycle 21, 22, and 23, empty, and these were given to PharmD [facility], by Foye Spurling RN, for documentation of return. Patient today reports no significant concerns or changes from last visit, she does continue with baseline fatigue and states that at the end of March she came down with a viral illness and had noticed some discomfort to her right axilla, which has since resolved. Patient also reports having hot flashes during the week in which she is off of palbociclib and then once she starts palbociclib again, hot flashes resolve. Patient also reports to be seeing a [facility] physician.  After MD's assessment of patient and review of all labs today, absolute neutrophils today at a grade 2 and within treatment levels, patient cleared to start her last cycles of 125 mg palbociclib on study, with first dose to start today. Cameo Windham provided patient with IRT assigned, dispensed by PharmD [facility]ith [facility], study palbociclib bottles for cycle 24, 25 and 26;  # [id number], [id number] and [id number], along with medication diaries for documentation of palbociclib and her anti-hormone medication. All patient's questions answered to her satisfaction and patient was encouraged to call Dr. Lindi Adie or myself with any questions or concerns. Patient knows next appointment will be her EOT visit on 01/23/2017. Patient thanked for her time and continued support of study. Patient to return  sooner if needed.   Adele Dan, RN, BSN Clinical Research 09/25/2016 3:04 PM

## 2016-09-25 NOTE — Progress Notes (Signed)
Patient Care Team: Debbrah Alar, NP as PCP - General (Internal Medicine) Excell Seltzer, MD as Consulting Physician (General Surgery) Nicholas Lose, MD as Consulting Physician (Hematology and Oncology) [name], MD as Attending Physician (Radiation Oncology) Katheran James., MD (Endocrinology) Maxwell Marion, RN as Registered Nurse (Medical Oncology)  DIAGNOSIS:  Encounter Diagnosis  Name Primary?  . Malignant neoplasm of upper-outer quadrant of left breast in female, estrogen receptor positive ([place])     SUMMARY OF ONCOLOGIC HISTORY:   Breast cancer of upper-outer quadrant of left female breast ([place])   12/22/2013 Mammogram    Left breast upper-outer quadrant 1.8 x 1.4 x 1.6 cm mass with left axillary lymph node measuring 3.6 mm      12/30/2013 Breast MRI    Dominant enhancing left upper outer quadrant irregular mass encompassing a confluent area of abnormal clumped nodular enhancement, 5.5 cm overall, 6 mm masslike enhancement central left breast        02/23/2014 Surgery    Left lumpectomy: Invasive ductal carcinoma grade 2 spending 3.8 cm intermediate grade DCIS with lymphovascular invasion margins negative one out of 4 SLN positive, ER 99%, PR 100%, HER-2 negative ratio 0.74, T2, N1, M0 stage IIB      03/19/2014 PET scan    No evidence of distant metastatic disease      03/31/2014 - 08/18/2014 Chemotherapy    Adjuvant chemotherapy with dose dense Adriamycin Cytoxan x4 followed by Abraxane weekly x12      08/28/2014 Imaging    Left brachial vein thrombus      09/11/2014 -  Radiation Therapy    Adjuvant radiation therapy      11/20/2014 -  Anti-estrogen oral therapy    Anastrozole 1 mg daily + Ibrance ([name] clinical trial)       CHIEF COMPLIANT:  Follow-up on [name]  Clinical trial. Complaining of mild discomfort right axilla  INTERVAL HISTORY: Michelle Kennedy is a  61 year old with above-mentioned history of left breast cancer currently on  adjuvant antiestrogen therapy with anastrozole along with Palbociclib on clinical trial [name].  She will finish her treatment  With Palbociclib by July.  She reports no major problems tolerating the study drug. She has noticed hot flashes on the week that she does not take Palbociclib. These appear to be quite severe and debilitating. But then when she starts Palbociclib the hot flashes seem to go away. Recently she had a viral illness and felt some discomfort in the right axilla.  REVIEW OF SYSTEMS:   Constitutional: Denies fevers, chills or abnormal weight loss Eyes: Denies blurriness of vision Ears, nose, mouth, throat, and face: Denies mucositis or sore throat Respiratory: Denies cough, dyspnea or wheezes Cardiovascular: Denies palpitation, chest discomfort Gastrointestinal:  Denies nausea, heartburn or change in bowel habits Skin: Denies abnormal skin rashes Lymphatics: Denies new lymphadenopathy or easy bruising Neurological:Denies numbness, tingling or new weaknesses Behavioral/Psych: Mood is stable, no new changes  Extremities: No lower extremity edema Breast:   Mild right axillary discomfort All other systems were reviewed with the patient and are negative.  I have reviewed the past medical history, past surgical history, social history and family history with the patient and they are unchanged from previous note.  ALLERGIES:  is allergic to bee venom; contrast media [iodinated diagnostic agents]; iodine; latex; penicillins; shellfish allergy; aspirin; erythromycin; lidocaine; symbicort [budesonide-formoterol fumarate]; codeine; compazine [prochlorperazine maleate]; pentazocine lactate; and sulfonamide derivatives.  MEDICATIONS:  Current Outpatient Prescriptions  Medication Sig Dispense Refill  .  albuterol (PROAIR HFA) 108 (90 Base) MCG/ACT inhaler INHALE 2 PUFFS INTO THE LUNGS EVERY 6 HOURS AS NEEDED 3 Inhaler 0  . albuterol (PROVENTIL) (5 MG/ML) 0.5% nebulizer solution Take 2.5  mg by nebulization every 6 (six) hours as needed for wheezing or shortness of breath.    . anastrozole (ARIMIDEX) 1 MG tablet Take 1 mg by mouth daily.    . benzonatate (TESSALON) 100 MG capsule Take 1 capsule (100 mg total) by mouth 3 (three) times daily as needed. 20 capsule 0  . Calcium Carbonate (CALCIUM 500 PO) Take 1 tablet by mouth daily.     . ciprofloxacin (CILOXAN) 0.3 % ophthalmic solution Place 1 drop into both eyes every 2 (two) hours. Administer 1 drop, every 2 hours, while awake, for 2 days. Then 1 drop, every 4 hours, while awake, for the next 5 days. (Patient not taking: Reported on 09/20/2016) 5 mL 0  . diphenhydrAMINE (BENADRYL) 25 MG tablet Take 1 tablet an hour before MRI.  Then take 1 tablet at bedtime as needed, per your usual regimen. 30 tablet 0  . DULoxetine (CYMBALTA) 60 MG capsule TAKE 1 CAPSULE BY MOUTH  DAILY 90 capsule 1  . EPINEPHrine 0.3 mg/0.3 mL IJ SOAJ injection Inject 0.3 mLs (0.3 mg total) into the muscle once. 2 Device 0  . Ergocalciferol (VITAMIN D2) 400 UNITS TABS Take 1 tablet by mouth daily.    . Fluticasone-Salmeterol (ADVAIR DISKUS) 250-50 MCG/DOSE AEPB Inhale 1 puff into the lungs 2 (two) times daily. 180 each 1  . Investigational palbociclib (IBRANCE) 125 MG capsule [facility] AFT-05 [name] Take 1 capsule (125 mg total) by mouth daily. Take with food. Swallow whole. Do not chew. Take on days 1-21. Repeat every 28 days. 69 capsule 0  . levothyroxine (SYNTHROID, LEVOTHROID) 175 MCG tablet Take 1 tablet by mouth  daily . Take additional 1/2 tablet = 87.5 mcg on Monday and Friday (total 262.5  mcg) 102 tablet 1  . meclizine (ANTIVERT) 50 MG tablet Take 0.5 tablets (25 mg total) by mouth 3 (three) times daily as needed for dizziness or nausea. 21 tablet 0  . meloxicam (MOBIC) 7.5 MG tablet TAKE 1 TABLET BY MOUTH  DAILY 90 tablet 1  . Multiple Vitamin (MULTIVITAMIN) tablet Take 1 tablet by mouth daily.      . neomycin-polymyxin-hydrocortisone  (CORTISPORIN) otic solution Place 3 drops into the left ear 4 (four) times daily. (Patient not taking: Reported on 09/20/2016) 10 mL 0  . nitroGLYCERIN (NITROSTAT) 0.4 MG SL tablet Place 1 tablet (0.4 mg total) under the tongue every 5 (five) minutes as needed for chest pain. 30 tablet 0  . Omega-3 Fatty Acids (FISH OIL) 1200 MG CAPS Take 1 capsule by mouth daily.      . simvastatin (ZOCOR) 40 MG tablet TAKE 1 TABLET BY MOUTH  DAILY 90 tablet 1   No current facility-administered medications for this visit.     PHYSICAL EXAMINATION: ECOG PERFORMANCE STATUS: 1 - Symptomatic but completely ambulatory  Vitals:   09/25/16 1024  BP: (!) 157/87  Pulse: 73  Resp: 18  Temp: 97.8 F (36.6 C)   Filed Weights   09/25/16 1024  Weight: 275 lb 8 oz (125 kg)    GENERAL:alert, no distress and comfortable SKIN: skin color, texture, turgor are normal, no rashes or significant lesions EYES: normal, Conjunctiva are pink and non-injected, sclera clear OROPHARYNX:no exudate, no erythema and lips, buccal mucosa, and tongue normal  NECK: supple, thyroid normal size, non-tender,   without nodularity LYMPH:  no palpable lymphadenopathy in the cervical, axillary or inguinal LUNGS: clear to auscultation and percussion with normal breathing effort HEART: regular rate & rhythm and no murmurs and no lower extremity edema ABDOMEN:abdomen soft, non-tender and normal bowel sounds MUSCULOSKELETAL:no cyanosis of digits and no clubbing  NEURO: alert & oriented x 3 with fluent speech, no focal motor/sensory deficits EXTREMITIES: No lower extremity edema BREAST: No palpable masses or nodules in either right or left breasts. No palpable axillary supraclavicular or infraclavicular adenopathy no breast tenderness or nipple discharge. (exam performed in the presence of a chaperone)  LABORATORY DATA:  I have reviewed the data as listed   Chemistry      Component Value Date/Time   NA 142 09/20/2016 1045   NA 143  07/03/2016 0946   K 4.6 09/20/2016 1045   K 4.1 07/03/2016 0946   CL 100 09/20/2016 1045   CO2 29 09/20/2016 1045   CO2 29 07/03/2016 0946   BUN 12 09/20/2016 1045   BUN 8.8 07/03/2016 0946   CREATININE 0.92 09/20/2016 1045   CREATININE 0.9 07/03/2016 0946      Component Value Date/Time   CALCIUM 10.1 09/20/2016 1045   CALCIUM 9.8 07/03/2016 0946   ALKPHOS 60 09/20/2016 1045   ALKPHOS 64 07/03/2016 0946   AST 30 09/20/2016 1045   AST 40 (H) 07/03/2016 0946   ALT 39 (H) 09/20/2016 1045   ALT 67 (H) 07/03/2016 0946   BILITOT 0.4 09/20/2016 1045   BILITOT 0.39 07/03/2016 0946       Lab Results  Component Value Date   WBC 2.5 (L) 09/25/2016   HGB 13.2 09/25/2016   HCT 40.5 09/25/2016   MCV 86.5 09/25/2016   PLT 236 09/25/2016   NEUTROABS 1.1 (L) 09/25/2016    ASSESSMENT & PLAN:  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 M0 stage IIB Adjuvant chemotherapy Completed 4 cycles of dose dense Adriamycin and Cytoxan. Followed by Abraxane X 12 completed 08/18/14, status post radiation completed June 2016, started anastrozole 1 mg daily 11/20/2014 (also enrolled on [name] clinical trial randomized to Ibrance)   Lymphedema left arm: Improved with physical therapy for lymphedema History of thyroid cancer:Low likelihood of recurrence  Current treatment: Adjuvant antiestrogen therapy with anastrozole once daily for 5 years started 11/20/2014 + Ibrance ([name] trial)  Ibrance toxicities: 1. Grade 2 neutropenia: Did not require dose reduction, close monitoring of blood counts, ANC  1.1 on 09/25/2016. Patient will remain on 125 mg dose 2. Otherwise no major side effects to Ibrance 3. 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  continue the same dosage of Ibrance.  Survivorship: Patient is now swimming and  exercising every day. Is staying active.  She is also seeing a weight loss specialist who put her on a daily basis with. Weight loss diet. She wants her to her that its not working for her. However she did lose 5 pounds.  Breast Cancer Surveillance: 1. Breast exam: 09/25/2016 Normal except for scar tissue related to prior surgery and radiation 2. Mammogram February 2018 Category B breast density. No mammographic abnormalities postsurgical changes.  RTC3 months for labs and follow-up   I spent 25 minutes talking to the patient of which more than half was spent in counseling and coordination of care.  No orders of the defined types were placed   in this encounter.  The patient has a good understanding of the overall plan. she agrees with it. she will call with any problems that may develop before the next visit here.   Rulon Eisenmenger, MD 09/25/16

## 2016-09-25 NOTE — Assessment & Plan Note (Signed)
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 M0 stage IIB Adjuvant chemotherapy Completed 4 cycles of dose dense Adriamycin and Cytoxan. Followed by Abraxane X 12 completed 08/18/14, status post radiation completed June 2016, started anastrozole 1 mg daily 11/20/2014 (also enrolled on [name] clinical trial randomized to [facility])   Lymphedema left arm: Improved with physical therapy for lymphedema History of thyroid cancer:Low likelihood of recurrence  Current treatment: Adjuvant antiestrogen therapy with anastrozole once daily for 5 years started 11/20/2014 + Ibrance ([name] trial)  Ibrance toxicities: 1. Grade 2 neutropenia: Did not require dose reduction, close monitoring of blood counts, ANC 2.3on 07/03/2016 2. Otherwise no major side effects to Ibrance 3. 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  continue the same dosage of Ibrance.  Survivorship: Patient is now swimming and exercising every day. Is staying active. Breast Cancer Surveillance: 1. Breast exam: 09/25/2016 Normal except for scar tissue related to prior surgery and radiation 2. Mammogram February 2018 Category B breast density. No mammographic abnormalities postsurgical changes.  RTC3 months for labs and follow-up

## 2016-09-26 MED ORDER — INV-PALBOCICLIB 125 MG CAPS #23 ALLIANCE FOUNDATION AFT-05 (PALLAS): 125.0000 mg | ORAL_CAPSULE | Freq: Every day | ORAL | 0 refills | Status: DC

## 2016-09-27 ENCOUNTER — Telehealth: Payer: Self-pay | Admitting: Family

## 2016-09-27 DIAGNOSIS — Z85850 Personal history of malignant neoplasm of thyroid: Secondary | ICD-10-CM

## 2016-09-27 NOTE — Telephone Encounter (Signed)
Spoke with the pt and informed her per Dr Leafy Ro to increase her water intake and to start Colace 100 mg BID until her next follow up visit. Patient verbalized understanding. Sabien Umland, [place]

## 2016-09-27 NOTE — Telephone Encounter (Signed)
Left detailed message on pt's voicemail and to let us know if she has not been contacted within 1 week about appt.

## 2016-09-27 NOTE — Telephone Encounter (Signed)
Caller name: Ernest Orr Relationship to patient: self Can be reached: [phone] Pharmacy: [place], [place]  Reason for call: Pt called stating she needs referral to a different Endocrinologist. They have been with [facility] for several years and have repetitive issues getting pt meds filled, getting call backs, etc. The pt & her husband go there and have been without medications for extended time periods. We discussed [facility] as a possibility and she would like to try them. Please enter referral.

## 2016-09-27 NOTE — Telephone Encounter (Signed)
Referral placed.

## 2016-10-04 ENCOUNTER — Ambulatory Visit (INDEPENDENT_AMBULATORY_CARE_PROVIDER_SITE_OTHER): Payer: 59 | Admitting: Family Medicine

## 2016-10-04 VITALS — BP 134/84 | HR 85 | Temp 98.4°F | Ht 68.0 in | Wt 269.0 lb

## 2016-10-04 DIAGNOSIS — E559 Vitamin D deficiency, unspecified: Secondary | ICD-10-CM | POA: Insufficient documentation

## 2016-10-04 DIAGNOSIS — Z9189 Other specified personal risk factors, not elsewhere classified: Secondary | ICD-10-CM

## 2016-10-04 DIAGNOSIS — K5900 Constipation, unspecified: Secondary | ICD-10-CM

## 2016-10-04 DIAGNOSIS — R7303 Prediabetes: Secondary | ICD-10-CM

## 2016-10-04 MED ORDER — VITAMIN D (ERGOCALCIFEROL) 1.25 MG (50000 UNIT) PO CAPS: 50000.0000 [IU] | ORAL_CAPSULE | ORAL | 0 refills | Status: DC

## 2016-10-04 NOTE — Progress Notes (Signed)
Office: [phone]  /  Fax: [phone]   HPI:   Chief Complaint: OBESITY Michelle Kennedy is here to discuss her progress with her obesity treatment plan. She is on the  follow our protein rich vegetarian plan and is following her eating plan approximately 98 % of the time. She states she is walking 30 minutes 7 times per week. Michelle Kennedy did well with weight loss but felt too limited with plan. She denied excessive hunger but felt this plan made her constipated. Her weight is 269 lb (122 kg) today and has had a weight loss of 6 pounds over a period of 2 weeks since her last visit. She has lost 6 lbs since starting treatment with [place].  Vitamin D deficiency Michelle Kennedy has a diagnosis of vitamin D deficiency. She is currently taking  OTC vit D, almost at goal. She admits fatigue and denies nausea, vomiting or muscle weakness.  Pre-Diabetes Michelle Kennedy has a diagnosis of pre-diabetes based on her elevated Hgb A1c and was informed this puts her at greater risk of developing diabetes. Michelle Kennedy states has had for 20 years. She is not taking metformin currently and continues to work on diet and exercise to decrease risk of diabetes but is eating too many simple carbohydrates. She denies polyphagia, nausea or hypoglycemia.  At risk for diabetes Michelle Kennedy is at higher than average risk for developing diabetes due to her obesity and pre-diabetes. She currently denies polyuria or polydipsia.  Constipation Michelle Kennedy went 3 days between [facility] and currently had a BM after finally taking a laxative. She notes constipation for the last few weeks, worse since attempting weight loss. She noted abdominal pain and back pain.  ALLERGIES: Allergies  Allergen Reactions   Bee Venom Anaphylaxis   Contrast Media [Iodinated Diagnostic Agents] Shortness Of Breath   Iodine Other (See Comments)    CARDIAC ARREST   Latex Anaphylaxis and Rash   Penicillins Shortness Of Breath, Rash and Other (See Comments)    SEIZURE    Shellfish Allergy Shortness Of Breath   Aspirin Rash and Other (See Comments)    GI UPSET   Erythromycin Swelling and Rash    SWELLING OF JOINTS   Lidocaine Swelling    SWELLING OF MOUTH AND THROAT   Symbicort [Budesonide-Formoterol Fumarate] Other (See Comments)    BURNING OF TONGUE AND LIPS   Codeine Rash   Compazine [Prochlorperazine Maleate] Rash    Rash on face,chest, arms, back   Pentazocine Lactate Rash   Sulfonamide Derivatives Rash    MEDICATIONS: Current Outpatient Prescriptions on File Prior to Visit  Medication Sig Dispense Refill   albuterol (PROAIR HFA) 108 (90 Base) MCG/ACT inhaler INHALE 2 PUFFS INTO THE LUNGS EVERY 6 HOURS AS NEEDED 3 Inhaler 0   albuterol (PROVENTIL) (5 MG/ML) 0.5% nebulizer solution Take 2.5 mg by nebulization every 6 (six) hours as needed for wheezing or shortness of breath.     anastrozole (ARIMIDEX) 1 MG tablet Take 1 mg by mouth daily.     benzonatate (TESSALON) 100 MG capsule Take 1 capsule (100 mg total) by mouth 3 (three) times daily as needed. 20 capsule 0   Calcium Carbonate (CALCIUM 500 PO) Take 1 tablet by mouth daily.      ciprofloxacin (CILOXAN) 0.3 % ophthalmic solution Place 1 drop into both eyes every 2 (two) hours. Administer 1 drop, every 2 hours, while awake, for 2 days. Then 1 drop, every 4 hours, while awake, for the next 5 days. 5 mL 0   diphenhydrAMINE (BENADRYL)  25 MG tablet Take 1 tablet an hour before MRI.  Then take 1 tablet at bedtime as needed, per your usual regimen. 30 tablet 0   DULoxetine (CYMBALTA) 60 MG capsule TAKE 1 CAPSULE BY MOUTH  DAILY 90 capsule 1   EPINEPHrine 0.3 mg/0.3 mL IJ SOAJ injection Inject 0.3 mLs (0.3 mg total) into the muscle once. 2 Device 0   Ergocalciferol (VITAMIN D2) 400 UNITS TABS Take 1 tablet by mouth daily.     Fluticasone-Salmeterol (ADVAIR DISKUS) 250-50 MCG/DOSE AEPB Inhale 1 puff into the lungs 2 (two) times daily. 180 each 1   Investigational palbociclib (IBRANCE)  125 MG capsule [facility] AFT-05 [name] Take 1 capsule (125 mg total) by mouth daily. Take with food. Swallow whole. Do not chew. Take on days 1-21. Repeat every 28 days. 69 capsule 0   levothyroxine (SYNTHROID, LEVOTHROID) 125 MCG tablet Take 125 mcg by mouth daily before breakfast.     meclizine (ANTIVERT) 50 MG tablet Take 0.5 tablets (25 mg total) by mouth 3 (three) times daily as needed for dizziness or nausea. 21 tablet 0   meloxicam (MOBIC) 7.5 MG tablet TAKE 1 TABLET BY MOUTH  DAILY 90 tablet 1   mirabegron ER (MYRBETRIQ) 25 MG TB24 tablet Take 25 mg by mouth daily.      Multiple Vitamin (MULTIVITAMIN) tablet Take 1 tablet by mouth daily.       neomycin-polymyxin-hydrocortisone (CORTISPORIN) otic solution Place 3 drops into the left ear 4 (four) times daily. 10 mL 0   nitroGLYCERIN (NITROSTAT) 0.4 MG SL tablet Place 1 tablet (0.4 mg total) under the tongue every 5 (five) minutes as needed for chest pain. 30 tablet 0   Omega-3 Fatty Acids (FISH OIL) 1200 MG CAPS Take 1 capsule by mouth daily.       simvastatin (ZOCOR) 40 MG tablet TAKE 1 TABLET BY MOUTH  DAILY 90 tablet 1   traMADol (ULTRAM) 50 MG tablet Take 50 mg by mouth every 6 (six) hours as needed.     No current facility-administered medications on file prior to visit.     PAST MEDICAL HISTORY: Past Medical History:  Diagnosis Date   Abnormally small mouth    Achilles tendon disorder, right    Anemia    Anxiety    Arthritis    hips and knees   Asthma    daily inhaler, prn inhaler and neb.   Asthma due to environmental allergies    Breast cancer ([place]) 01/2014   left   Cancer [facility])    Chest pain    COPD (chronic obstructive pulmonary disease) (HCC)    Dental bridge present    upper front and lower right   Dental crowns present    x 3   Depression    Family history of anesthesia complication    twin brother aspirated and died on OR table, per pt.   Fibromyalgia    Gallbladder  disease    GERD (gastroesophageal reflux disease)    H/O blood clots    History of gastric ulcer    as a teenager   History of seizure age 70   as a reaction to Penicillin - no seizures since   History of stomach ulcers    History of thyroid cancer    s/p thyroidectomy   Hyperlipidemia    Hypothyroidism    Joint pain    Left knee injury    Liver disorder    Lymphedema    left arm  Menopause    Migraines    Multiple food allergies    Obesity    OSA (obstructive sleep apnea) 02/16/2016   Palpitations    Personal history of chemotherapy 2015   Personal history of radiation therapy 2015   Left   Prediabetes    Rheumatoid arthritis (HCC)    SOB (shortness of breath)    Swelling of both ankles    Tinnitus    UTI (lower urinary tract infection) 02/17/2014   Vitamin D deficiency     PAST SURGICAL HISTORY: Past Surgical History:  Procedure Laterality Date   ABDOMINAL HYSTERECTOMY  2004   complete   ACHILLES TENDON REPAIR Right    APPENDECTOMY  2004   BREAST LUMPECTOMY WITH NEEDLE LOCALIZATION AND AXILLARY SENTINEL LYMPH NODE BX Left 02/23/2014   Procedure: BREAST LUMPECTOMY WITH NEEDLE LOCALIZATION AND AXILLARY SENTINEL LYMPH NODE BIOPSY;  Surgeon: Excell Seltzer, MD;  Location: [place];  Service: General;  Laterality: Left;   BREAST SURGERY  2011   left breast biposy   CHOLECYSTECTOMY  1990   COLONOSCOPY W/ POLYPECTOMY  06/2009   EYE SURGERY Right 2013   exc. warts from underneath eyelid   INCONTINENCE SURGERY  2004   KNEE ARTHROSCOPY Bilateral    x 6 each knee   LIGAMENT REPAIR Right    thumb/wrist   ORIF TOE FRACTURE Right    great toe   PORTACATH PLACEMENT N/A 03/24/2014   Procedure: INSERTION PORT-A-CATH;  Surgeon: Excell Seltzer, MD;  Location: [place];  Service: General;  Laterality: N/A;   THYROIDECTOMY  2000   TONSILLECTOMY AND ADENOIDECTOMY  2000   TUMOR EXCISION     from  thoracic spine    SOCIAL HISTORY: Social History  Substance Use Topics   Smoking status: Never Smoker   Smokeless tobacco: Never Used   Alcohol use No    FAMILY HISTORY: Family History  Problem Relation Age of Onset   Alcohol abuse Mother    Arthritis Mother    Hypertension Mother    Bipolar disorder Mother    Breast cancer Mother 81       unconfirmed   Lung cancer Mother 31       smoker   Hyperlipidemia Mother    Stroke Mother    Cancer Mother    Depression Mother    Anxiety disorder Mother    Alcoholism Mother    Drug abuse Mother    Eating disorder Mother    Obesity Mother    Alcohol abuse Father    Cancer Father        lung   Hyperlipidemia Father    Kidney disease Father    Diabetes Father    Hypertension Father    Cystic kidney disease Father    Thyroid disease Father    Liver disease Father    Alcoholism Father    Arthritis Maternal Grandmother    Diabetes Paternal Grandmother    Thyroid cancer Sister 62       type?; currently 67   Other Sister        ovarian tumor @ 11; TAH/BSO   Thyroid cancer Brother        dx 37s; currently 22   Breast cancer Maternal Aunt        dx 64s; deceased 3   Thyroid cancer Paternal 77        All 3 paternal aunts with thyroid ca in 30s/40s   Lung cancer Paternal Aunt  2 of 3 paternal aunts with lung cancer    ROS: Review of Systems  Constitutional: Positive for malaise/fatigue and weight loss.  Gastrointestinal: Positive for abdominal pain and constipation. Negative for nausea and vomiting.  Genitourinary: Negative for frequency.  Musculoskeletal: Positive for back pain.       Negative muscle weakness  Endo/Heme/Allergies: Negative for polydipsia.       Negative hypoglycemia Negative polyphagia    PHYSICAL EXAM: Blood pressure 134/84, pulse 85, temperature 98.4 F (36.9 C), temperature source Oral, height 5\' 8"  (1.727 m), weight 269 lb (122 kg), SpO2 96 %. Body  mass index is 40.9 kg/m. Physical Exam  Constitutional: She is oriented to person, place, and time. She appears well-developed and well-nourished.  Cardiovascular: Normal rate.   Pulmonary/Chest: Effort normal.  Musculoskeletal: Normal range of motion.  Neurological: She is oriented to person, place, and time.  Skin: Skin is warm and dry.  Psychiatric: She has a normal mood and affect. Her behavior is normal.  Vitals reviewed.   RECENT LABS AND TESTS: BMET    Component Value Date/Time   NA 142 09/20/2016 1045   NA 143 07/03/2016 0946   K 4.6 09/20/2016 1045   K 4.1 07/03/2016 0946   CL 100 09/20/2016 1045   CO2 29 09/20/2016 1045   CO2 29 07/03/2016 0946   GLUCOSE 93 09/20/2016 1045   GLUCOSE 115 07/03/2016 0946   BUN 12 09/20/2016 1045   BUN 8.8 07/03/2016 0946   CREATININE 0.92 09/20/2016 1045   CREATININE 0.9 07/03/2016 0946   CALCIUM 10.1 09/20/2016 1045   CALCIUM 9.8 07/03/2016 0946   GFRNONAA 67 09/20/2016 1045   GFRAA 77 09/20/2016 1045   Lab Results  Component Value Date   HGBA1C 5.6 09/20/2016   HGBA1C 5.7 (H) 04/10/2016   HGBA1C 5.9 (H) 10/25/2015   HGBA1C 5.7 (H) 05/10/2015   HGBA1C 5.8 (H) 12/17/2014   Lab Results  Component Value Date   INSULIN 21.8 09/20/2016   CBC    Component Value Date/Time   WBC 2.5 (L) 09/25/2016 1014   WBC 4.0 09/13/2015 1414   RBC 4.69 09/25/2016 1014   RBC 4.90 09/13/2015 1414   HGB 13.2 09/25/2016 1014   HCT 40.5 09/25/2016 1014   PLT 236 09/25/2016 1014   MCV 86.5 09/25/2016 1014   MCH 28.2 09/25/2016 1014   MCH 26.7 09/16/2014 1140   MCHC 32.6 09/25/2016 1014   MCHC 32.7 09/13/2015 1414   RDW 16.4 (H) 09/25/2016 1014   LYMPHSABS 0.9 09/25/2016 1014   MONOABS 0.4 09/25/2016 1014   EOSABS 0.0 09/25/2016 1014   EOSABS 0.0 09/20/2016 1045   BASOSABS 0.0 09/25/2016 1014   Iron/TIBC/Ferritin/ %Sat No results found for: IRON, TIBC, FERRITIN, IRONPCTSAT Lipid Panel     Component Value Date/Time   CHOL 177  09/20/2016 1045   TRIG 143 09/20/2016 1045   HDL 70 09/20/2016 1045   CHOLHDL 3 01/17/2016 1547   VLDL 33.0 01/17/2016 1547   LDLCALC 78 09/20/2016 1045   LDLDIRECT 102.0 07/08/2014 1107   Hepatic Function Panel     Component Value Date/Time   PROT 6.7 09/20/2016 1045   PROT 6.4 07/03/2016 0946   ALBUMIN 4.2 09/20/2016 1045   ALBUMIN 3.7 07/03/2016 0946   AST 30 09/20/2016 1045   AST 40 (H) 07/03/2016 0946   ALT 39 (H) 09/20/2016 1045   ALT 67 (H) 07/03/2016 0946   ALKPHOS 60 09/20/2016 1045   ALKPHOS 64 07/03/2016 0946   BILITOT  0.4 09/20/2016 1045   BILITOT 0.39 07/03/2016 0946   BILIDIR 0.1 08/11/2013 0918   IBILI 0.3 08/11/2013 0918      Component Value Date/Time   TSH 5.600 (H) 09/20/2016 1045   TSH 0.17 (L) 07/08/2014 1107   TSH 0.084 (L) 09/24/2013 1122    ASSESSMENT AND PLAN: Constipation, unspecified constipation type  Vitamin D deficiency - Plan: Vitamin D, Ergocalciferol, (DRISDOL) 50000 units CAPS capsule  Prediabetes  At risk for diabetes mellitus  Morbid obesity ([place]) - BMI 40.9  PLAN:  Vitamin D Deficiency Michelle Kennedy was informed that low vitamin D levels contributes to fatigue and are associated with obesity, breast, and colon cancer. She agrees to start to take prescription Vit D @50 ,000 IU every week #4 with no refills and will follow up for routine testing of vitamin D, at least 2-3 times per year. She was informed of the risk of over-replacement of vitamin D and agrees to not increase her dose unless he discusses this with [place] first. Michelle Kennedy agrees to follow up with our clinic in 2 weeks.  Pre-Diabetes Michelle Kennedy will continue to work on weight loss, exercise, and decreasing simple carbohydrates in her diet to help decrease the risk of diabetes. She was informed that eating too many simple carbohydrates or too many calories at one sitting increases the likelihood of GI side effects. She agrees to decrease simple carbohydrates and increase lean protein.  We will re-check labs in 3 months and Michelle Kennedy agreed to follow up with [place] as directed to monitor her progress.  Diabetes risk counselling Michelle Kennedy was given extended (at least 30 minutes) diabetes prevention counseling today. She is 61 y.o. female and has risk factors for diabetes including obesity and pre-diabetes. We discussed intensive lifestyle modifications today with an emphasis on weight loss as well as increasing exercise and decreasing simple carbohydrates in her diet.  Constipation Michelle Kennedy was informed decrease bowel movement frequency is normal while losing weight, but stools should not be hard or painful. She was advised to increase her H20 intake and work on increasing her fiber intake. High fiber foods were discussed today. We will change her diet prescription and see if she notes any improvement.  Obesity Michelle Kennedy is currently in the action stage of change. As such, her goal is to continue with weight loss efforts She has agreed to keep a food journal with 1100 to 1400 calories and 75+ grams of protein  Michelle Kennedy has been instructed to work up to a goal of 150 minutes of combined cardio and strengthening exercise per week for weight loss and overall health benefits. We discussed the following Behavioral Modification Strategies today: increasing H2O, increasing lean protein intake, decreasing simple carbohydrates, increasing fiber rich foods and how to journal.  Michelle Kennedy has agreed to follow up with our clinic in 2 weeks. She was informed of the importance of frequent follow up visits to maximize her success with intensive lifestyle modifications for her multiple health conditions.  I, Doreene Nest, am acting as scribe for Dennard Nip, MD  I have reviewed the above documentation for accuracy and completeness, and I agree with the above. -Dennard Nip, MD

## 2016-10-08 ENCOUNTER — Other Ambulatory Visit: Payer: Self-pay | Admitting: Family

## 2016-10-10 NOTE — Telephone Encounter (Signed)
Documentation shows Rx was discontinued on 07/04/16 due to change in therapy?. Will verify with pt at office visit tomorrow.

## 2016-10-11 ENCOUNTER — Ambulatory Visit (INDEPENDENT_AMBULATORY_CARE_PROVIDER_SITE_OTHER): Payer: 59 | Admitting: Family

## 2016-10-11 ENCOUNTER — Encounter: Payer: Self-pay | Admitting: Family

## 2016-10-11 ENCOUNTER — Telehealth: Payer: Self-pay | Admitting: *Deleted

## 2016-10-11 ENCOUNTER — Telehealth: Payer: Self-pay | Admitting: Medical Oncology

## 2016-10-11 VITALS — BP 141/84 | HR 71 | Temp 98.2°F | Resp 18 | Ht 68.0 in | Wt 274.7 lb

## 2016-10-11 DIAGNOSIS — E039 Hypothyroidism, unspecified: Secondary | ICD-10-CM | POA: Diagnosis not present

## 2016-10-11 DIAGNOSIS — N3281 Overactive bladder: Secondary | ICD-10-CM | POA: Diagnosis not present

## 2016-10-11 DIAGNOSIS — F329 Major depressive disorder, single episode, unspecified: Secondary | ICD-10-CM

## 2016-10-11 DIAGNOSIS — F32A Depression, unspecified: Secondary | ICD-10-CM

## 2016-10-11 DIAGNOSIS — J302 Other seasonal allergic rhinitis: Secondary | ICD-10-CM | POA: Diagnosis not present

## 2016-10-11 DIAGNOSIS — J45909 Unspecified asthma, uncomplicated: Secondary | ICD-10-CM

## 2016-10-11 MED ORDER — MIRABEGRON ER 50 MG PO TB24: 50.0000 mg | ORAL_TABLET | Freq: Every day | ORAL | 5 refills | Status: DC

## 2016-10-11 MED ORDER — LEVOTHYROXINE SODIUM 150 MCG PO TABS: 150.0000 ug | ORAL_TABLET | Freq: Every day | ORAL | 3 refills | Status: DC

## 2016-10-11 MED ORDER — MIRABEGRON ER 50 MG PO TB24: 50.0000 mg | ORAL_TABLET | Freq: Every day | ORAL | 1 refills | Status: DC

## 2016-10-11 MED FILL — MYRBETRIQ ER 50 MG TABLET: 50 | 30 days supply | Qty: 30 | Fill #0

## 2016-10-11 MED FILL — LEVOTHYROXINE 150 MCG TAB: 150 | 30 days supply | Qty: 30 | Fill #0

## 2016-10-11 NOTE — Telephone Encounter (Signed)
Per refill history, Myrbetriq 50mg  was sent to [place] today. Spoke with pt and she would like ongoing Rxs to go to mail order. Rx sent to [facility] as well.

## 2016-10-11 NOTE — Progress Notes (Signed)
Subjective:    Patient ID: Michelle Kennedy, female    DOB: 22-Oct-1955, 61 y.o.   MRN: [id number]  HPI  Michelle Kennedy is a 61 yr old female who presents today for follow up.  1) Hypothyroid- she is maintained on 125 mcg.  Feels "foggy headed." Reports that endo dcreased her synthroid form 175 to 125 mcg.  Lab Results  Component Value Date   TSH 5.600 (H) 09/20/2016   2) Asthma- maintained on advair.  Reports that she has some early AM cough but no significant asthma symptoms otherwise.   3) Depression-reports that her mood is good.  She is maintained on cymbalta/   4) OAB-Notes some improvement with myrbetriq but still feels like she is having bladder spasms, + urgency/frequency. No burning. Has upcoming appointment with urology.   5) Seasonal allergies- she is maintained on claritin D she believes.   Review of Systems    see HPI  Past Medical History:  Diagnosis Date  . Abnormally small mouth   . Achilles tendon disorder, right   . Anemia   . Anxiety   . Arthritis    hips and knees  . Asthma    daily inhaler, prn inhaler and neb.  . Asthma due to environmental allergies   . Breast cancer ([place]) 01/2014   left  . Cancer ([place])   . Chest pain   . COPD (chronic obstructive pulmonary disease) ([place])   . Dental bridge present    upper front and lower right  . Dental crowns present    x 3  . Depression   . Family history of anesthesia complication    twin brother aspirated and died on OR table, per pt.  . Fibromyalgia   . Gallbladder disease   . GERD (gastroesophageal reflux disease)   . H/O blood clots   . History of gastric ulcer    as a teenager  . History of seizure age 67   as a reaction to Penicillin - no seizures since  . History of stomach ulcers   . History of thyroid cancer    s/p thyroidectomy  . Hyperlipidemia   . Hypothyroidism   . Joint pain   . Left knee injury   . Liver disorder   . Lymphedema    left arm  . Menopause   . Migraines   . Multiple  food allergies   . Obesity   . OSA (obstructive sleep apnea) 02/16/2016  . Palpitations   . Personal history of chemotherapy 2015  . Personal history of radiation therapy 2015   Left  . Prediabetes   . Rheumatoid arthritis ([place])   . SOB (shortness of breath)   . Swelling of both ankles   . Tinnitus   . UTI (lower urinary tract infection) 02/17/2014  . Vitamin D deficiency      Social History   Social History  . Marital status: Married    Spouse name: N/A  . Number of children: N/A  . Years of education: N/A   Occupational History  . 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 Smoking status: Never Smoker  . Smokeless tobacco: Never Used  . Alcohol use No  . Drug use: No  . Sexual activity: Not Currently    Partners: Male     Comment: menarche age 69, P 2, first birth age 32, menopause age 29, Premarin x 10 yrs   Other Topics Concern  . Not on file  Social History Narrative   Regular exercise: yes          Past Surgical History:  Procedure Laterality Date  . ABDOMINAL HYSTERECTOMY  2004   complete  . ACHILLES TENDON REPAIR Right   . APPENDECTOMY  2004  . BREAST LUMPECTOMY WITH NEEDLE LOCALIZATION AND AXILLARY SENTINEL LYMPH NODE BX Left 02/23/2014   Procedure: BREAST LUMPECTOMY WITH NEEDLE LOCALIZATION AND AXILLARY SENTINEL LYMPH NODE BIOPSY;  Surgeon: Excell Seltzer, MD;  Location: [place];  Service: General;  Laterality: Left;  . BREAST SURGERY  2011   left breast biposy  . CHOLECYSTECTOMY  1990  . COLONOSCOPY W/ POLYPECTOMY  06/2009  . EYE SURGERY Right 2013   exc. warts from underneath eyelid  . INCONTINENCE SURGERY  2004  . KNEE ARTHROSCOPY Bilateral    x 6 each knee  . LIGAMENT REPAIR Right    thumb/wrist  . ORIF TOE FRACTURE Right    great toe  . PORTACATH PLACEMENT N/A 03/24/2014   Procedure: INSERTION PORT-A-CATH;  Surgeon: Excell Seltzer, MD;  Location: [facility];  Service: General;   Laterality: N/A;  . THYROIDECTOMY  2000  . TONSILLECTOMY AND ADENOIDECTOMY  2000  . TUMOR EXCISION     from thoracic spine    Family History  Problem Relation Age of Onset  . Alcohol abuse Mother   . Arthritis Mother   . Hypertension Mother   . Bipolar disorder Mother   . Breast cancer Mother 56       unconfirmed  . Lung cancer Mother 93       smoker  . Hyperlipidemia Mother   . Stroke Mother   . Cancer Mother   . Depression Mother   . Anxiety disorder Mother   . Alcoholism Mother   . Drug abuse Mother   . Eating disorder Mother   . Obesity Mother   . Alcohol abuse Father   . Cancer Father        lung  . Hyperlipidemia Father   . Kidney disease Father   . Diabetes Father   . Hypertension Father   . Cystic kidney disease Father   . Thyroid disease Father   . Liver disease Father   . Alcoholism Father   . Arthritis Maternal Grandmother   . Diabetes Paternal Grandmother   . Thyroid cancer Sister 3       type?; currently 38  . Other Sister        ovarian tumor @ 30; TAH/BSO  . Thyroid cancer Brother        dx 58s; currently 34  . Breast cancer Maternal Aunt        dx 41s; deceased 85  . Thyroid cancer Paternal Aunt        All 3 paternal aunts with thyroid ca in 30s/40s  . Lung cancer Paternal Aunt        2 of 3 paternal aunts with lung cancer    Allergies  Allergen Reactions  . Bee Venom Anaphylaxis  . Contrast Media [Iodinated Diagnostic Agents] Shortness Of Breath  . Iodine Other (See Comments)    CARDIAC ARREST  . Latex Anaphylaxis and Rash  . Penicillins Shortness Of Breath, Rash and Other (See Comments)    SEIZURE  . Shellfish Allergy Shortness Of Breath  . Aspirin Rash and Other (See Comments)    GI UPSET  . Erythromycin Swelling and Rash    SWELLING OF JOINTS  . Lidocaine Swelling    SWELLING  OF MOUTH AND THROAT  . Symbicort [Budesonide-Formoterol Fumarate] Other (See Comments)    BURNING OF TONGUE AND LIPS  . Codeine Rash  . Compazine  [Prochlorperazine Maleate] Rash    Rash on face,chest, arms, back  . Pentazocine Lactate Rash  . Sulfonamide Derivatives Rash    Current Outpatient Prescriptions on File Prior to Visit  Medication Sig Dispense Refill  . albuterol (PROAIR HFA) 108 (90 Base) MCG/ACT inhaler INHALE 2 PUFFS INTO THE LUNGS EVERY 6 HOURS AS NEEDED 3 Inhaler 0  . albuterol (PROVENTIL) (5 MG/ML) 0.5% nebulizer solution Take 2.5 mg by nebulization every 6 (six) hours as needed for wheezing or shortness of breath.    . anastrozole (ARIMIDEX) 1 MG tablet Take 1 mg by mouth daily.    . Calcium Carbonate (CALCIUM 500 PO) Take 1 tablet by mouth daily.     . diphenhydrAMINE (BENADRYL) 25 MG tablet Take 1 tablet an hour before MRI.  Then take 1 tablet at bedtime as needed, per your usual regimen. 30 tablet 0  . DULoxetine (CYMBALTA) 60 MG capsule TAKE 1 CAPSULE BY MOUTH  DAILY 90 capsule 1  . EPINEPHrine 0.3 mg/0.3 mL IJ SOAJ injection Inject 0.3 mLs (0.3 mg total) into the muscle once. 2 Device 0  . Ergocalciferol (VITAMIN D2) 400 UNITS TABS Take 1 tablet by mouth daily.    . Fluticasone-Salmeterol (ADVAIR DISKUS) 250-50 MCG/DOSE AEPB Inhale 1 puff into the lungs 2 (two) times daily. 180 each 1  . Investigational palbociclib (IBRANCE) 125 MG capsule [facility] AFT-05 [name] Take 1 capsule (125 mg total) by mouth daily. Take with food. Swallow whole. Do not chew. Take on days 1-21. Repeat every 28 days. 69 capsule 0  . meloxicam (MOBIC) 7.5 MG tablet TAKE 1 TABLET BY MOUTH  DAILY 90 tablet 1  . Multiple Vitamin (MULTIVITAMIN) tablet Take 1 tablet by mouth daily.      . nitroGLYCERIN (NITROSTAT) 0.4 MG SL tablet Place 1 tablet (0.4 mg total) under the tongue every 5 (five) minutes as needed for chest pain. 30 tablet 0  . Omega-3 Fatty Acids (FISH OIL) 1200 MG CAPS Take 1 capsule by mouth daily.      . simvastatin (ZOCOR) 40 MG tablet TAKE 1 TABLET BY MOUTH  DAILY 90 tablet 1  . traMADol (ULTRAM) 50 MG tablet Take 50  mg by mouth every 6 (six) hours as needed.    . Vitamin D, Ergocalciferol, (DRISDOL) 50000 units CAPS capsule Take 1 capsule (50,000 Units total) by mouth every 7 (seven) days. 4 capsule 0   No current facility-administered medications on file prior to visit.     BP (!) 141/84 (BP Location: Right Arm, Cuff Size: Large)   Pulse 71   Temp 98.2 F (36.8 C) (Oral)   Resp 18   Ht 5\' 8"  (1.727 m)   Wt 274 lb 11.2 oz (124.6 kg)   SpO2 98%   BMI 41.77 kg/m    Objective:   Physical Exam  Constitutional: She is oriented to person, place, and time. She appears well-developed and well-nourished.  HENT:  Head: Normocephalic and atraumatic.  Right Ear: Tympanic membrane and ear canal normal.  Left Ear: Tympanic membrane and ear canal normal.  Mouth/Throat: No oropharyngeal exudate, posterior oropharyngeal edema or posterior oropharyngeal erythema.  Neck: Neck supple.  Cardiovascular: Normal rate, regular rhythm and normal heart sounds.   No murmur heard. Pulmonary/Chest: Effort normal and breath sounds normal. No respiratory distress. She has no wheezes.  Musculoskeletal:  She exhibits no edema.  Neurological: She is alert and oriented to person, place, and time.  Skin: Skin is warm.  Psychiatric: She has a normal mood and affect. Her behavior is normal. Judgment and thought content normal.          Assessment & Plan:  Hypothyroid- increase synthroid from 125 to 150 mcg.  OAB- uncontrolled. Increase myrbetriq from 25mg  to 50mg . Pt to keep upcoming apt with urology.  Asthma- stable on advair. Continue same.  Seasonal allergies- stable on claritin, D. Continue same.   Depression -stable on cymbalta. Continue same.

## 2016-10-11 NOTE — Telephone Encounter (Signed)
[facility]: Patient called regarding receiving a bill for her visit with Dr. Lindi Adie on 07/03/2016 for $150.00 Patient states she has never received a bill before regarding an office visit while on study in the past 2 years and is questioning why she is receiving a bill now. I informed patient that the study pays for the study drug palbociclib, but all else is billed to insurance, patient voiced understanding. I asked patient to fax me a copy of her bill for my manager to review and to see what is different regarding the research billing to see if we can answer her questions. Patient confirmed understanding and states will fax copy to me. I thanked patient for her patience and informed her will let her know as soon as I have an answer. Patient knows to call with questions in the meantime.  Adele Dan, RN, BSN Clinical Research 10/11/2016 12:02 PM

## 2016-10-11 NOTE — Patient Instructions (Addendum)
Please increase myrbetrique from 25 to 50mg  once daily and keep your upcoming appointment with urology. Increase synthroid from 125 mcg to 150 mcg.

## 2016-10-11 NOTE — Telephone Encounter (Signed)
Per refill history, Myrbetriq 50mg  was sent to [facility] today. Spoke with pt and she would like ongoing Rxs to go to mail order. Rx sent to [facility] as well.

## 2016-10-12 ENCOUNTER — Telehealth: Payer: Self-pay | Admitting: Medical Oncology

## 2016-10-12 NOTE — Telephone Encounter (Signed)
Call to patient to inform her that I had not received her fax. Patient states that the billing issue has been fixed and charges had been reversed. Patient thanked me for following up with her. Patient denies any questions at this time. Adele Dan, RN, BSN Clinical Research 10/12/2016 3:04 PM

## 2016-10-18 ENCOUNTER — Ambulatory Visit (INDEPENDENT_AMBULATORY_CARE_PROVIDER_SITE_OTHER): Payer: 59 | Admitting: Family Medicine

## 2016-10-18 VITALS — BP 140/87 | HR 73 | Temp 98.2°F | Ht 68.0 in | Wt 266.0 lb

## 2016-10-18 DIAGNOSIS — I10 Essential (primary) hypertension: Secondary | ICD-10-CM | POA: Diagnosis not present

## 2016-10-18 DIAGNOSIS — Z6841 Body Mass Index (BMI) 40.0 and over, adult: Secondary | ICD-10-CM | POA: Diagnosis not present

## 2016-10-18 DIAGNOSIS — IMO0001 Reserved for inherently not codable concepts without codable children: Secondary | ICD-10-CM

## 2016-10-19 NOTE — Progress Notes (Signed)
Office: [phone]  /  Fax: [phone]   HPI:   Chief Complaint: OBESITY Michelle Kennedy is here to discuss her progress with her obesity treatment plan. She is on the  keep a food journal with 1100 to 1400 calories and 75+ grams of protein  and is following her eating plan approximately 75 % of the time. She states she is exercising swim/walk 30 minutes per day for 60 minutes 7 times per week. Michelle Kennedy continues to do well with weight loss but sometimes cuts her calories too low (900 calories). She is working on increasing protein. Her weight is 266 lb (120.7 kg) today and has had a weight loss of 3 pounds over a period of 2 weeks since her last visit. She has lost 8 lbs since starting treatment with [place].  Hypertension Michelle Kennedy is a 61 y.o. female with hypertension. Blood pressure still slightly elevated but overall improved with diet and exercise.  Michelle Kennedy denies chest pain or shortness of breath on exertion. She is working weight loss to help control her blood pressure with the goal of decreasing her risk of heart attack and stroke. Michelle Kennedy blood pressure is not currently controlled.    ALLERGIES: Allergies  Allergen Reactions  . Bee Venom Anaphylaxis  . Contrast Media [Iodinated Diagnostic Agents] Shortness Of Breath  . Iodine Other (See Comments)    CARDIAC ARREST  . Latex Anaphylaxis and Rash  . Penicillins Shortness Of Breath, Rash and Other (See Comments)    SEIZURE  . Shellfish Allergy Shortness Of Breath  . Aspirin Rash and Other (See Comments)    GI UPSET  . Erythromycin Swelling and Rash    SWELLING OF JOINTS  . Lidocaine Swelling    SWELLING OF MOUTH AND THROAT  . Symbicort [Budesonide-Formoterol Fumarate] Other (See Comments)    BURNING OF TONGUE AND LIPS  . Codeine Rash  . Compazine [Prochlorperazine Maleate] Rash    Rash on face,chest, arms, back  . Pentazocine Lactate Rash  . Sulfonamide Derivatives Rash    MEDICATIONS: Current Outpatient  Prescriptions on File Prior to Visit  Medication Sig Dispense Refill  . albuterol (PROAIR HFA) 108 (90 Base) MCG/ACT inhaler INHALE 2 PUFFS INTO THE LUNGS EVERY 6 HOURS AS NEEDED 3 Inhaler 0  . albuterol (PROVENTIL) (5 MG/ML) 0.5% nebulizer solution Take 2.5 mg by nebulization every 6 (six) hours as needed for wheezing or shortness of breath.    . anastrozole (ARIMIDEX) 1 MG tablet Take 1 mg by mouth daily.    . Calcium Carbonate (CALCIUM 500 PO) Take 1 tablet by mouth daily.     . diphenhydrAMINE (BENADRYL) 25 MG tablet Take 1 tablet an hour before MRI.  Then take 1 tablet at bedtime as needed, per your usual regimen. 30 tablet 0  . DULoxetine (CYMBALTA) 60 MG capsule TAKE 1 CAPSULE BY MOUTH  DAILY 90 capsule 1  . EPINEPHrine 0.3 mg/0.3 mL IJ SOAJ injection Inject 0.3 mLs (0.3 mg total) into the muscle once. 2 Device 0  . Ergocalciferol (VITAMIN D2) 400 UNITS TABS Take 1 tablet by mouth daily.    . Fluticasone-Salmeterol (ADVAIR DISKUS) 250-50 MCG/DOSE AEPB Inhale 1 puff into the lungs 2 (two) times daily. 180 each 1  . Investigational palbociclib (IBRANCE) 125 MG capsule [facility] AFT-05 [name] Take 1 capsule (125 mg total) by mouth daily. Take with food. Swallow whole. Do not chew. Take on days 1-21. Repeat every 28 days. 69 capsule 0  . levothyroxine (SYNTHROID, LEVOTHROID) 150  MCG tablet Take 1 tablet (150 mcg total) by mouth daily. (Patient taking differently: Take 175 mcg by mouth daily. ) 30 tablet 3  . meloxicam (MOBIC) 7.5 MG tablet TAKE 1 TABLET BY MOUTH  DAILY 90 tablet 1  . mirabegron ER (MYRBETRIQ) 50 MG TB24 tablet Take 1 tablet (50 mg total) by mouth daily. 90 tablet 1  . Multiple Vitamin (MULTIVITAMIN) tablet Take 1 tablet by mouth daily.      . nitroGLYCERIN (NITROSTAT) 0.4 MG SL tablet Place 1 tablet (0.4 mg total) under the tongue every 5 (five) minutes as needed for chest pain. 30 tablet 0  . Omega-3 Fatty Acids (FISH OIL) 1200 MG CAPS Take 1 capsule by mouth daily.       . simvastatin (ZOCOR) 40 MG tablet TAKE 1 TABLET BY MOUTH  DAILY 90 tablet 1  . traMADol (ULTRAM) 50 MG tablet Take 50 mg by mouth every 6 (six) hours as needed.    . Vitamin D, Ergocalciferol, (DRISDOL) 50000 units CAPS capsule Take 1 capsule (50,000 Units total) by mouth every 7 (seven) days. 4 capsule 0   No current facility-administered medications on file prior to visit.     PAST MEDICAL HISTORY: Past Medical History:  Diagnosis Date  . Abnormally small mouth   . Achilles tendon disorder, right   . Anemia   . Anxiety   . Arthritis    hips and knees  . Asthma    daily inhaler, prn inhaler and neb.  . Asthma due to environmental allergies   . Breast cancer ([place]) 01/2014   left  . Cancer ([place])   . Chest pain   . COPD (chronic obstructive pulmonary disease) ([place])   . Dental bridge present    upper front and lower right  . Dental crowns present    x 3  . Depression   . Family history of anesthesia complication    twin brother aspirated and died on OR table, per pt.  . Fibromyalgia   . Gallbladder disease   . GERD (gastroesophageal reflux disease)   . H/O blood clots   . History of gastric ulcer    as a teenager  . History of seizure age 81   as a reaction to Penicillin - no seizures since  . History of stomach ulcers   . History of thyroid cancer    s/p thyroidectomy  . Hyperlipidemia   . Hypothyroidism   . Joint pain   . Left knee injury   . Liver disorder   . Lymphedema    left arm  . Menopause   . Migraines   . Multiple food allergies   . Obesity   . OSA (obstructive sleep apnea) 02/16/2016  . Palpitations   . Personal history of chemotherapy 2015  . Personal history of radiation therapy 2015   Left  . Prediabetes   . Rheumatoid arthritis ([place])   . SOB (shortness of breath)   . Swelling of both ankles   . Tinnitus   . UTI (lower urinary tract infection) 02/17/2014  . Vitamin D deficiency     PAST SURGICAL HISTORY: Past Surgical History:    Procedure Laterality Date  . ABDOMINAL HYSTERECTOMY  2004   complete  . ACHILLES TENDON REPAIR Right   . APPENDECTOMY  2004  . BREAST LUMPECTOMY WITH NEEDLE LOCALIZATION AND AXILLARY SENTINEL LYMPH NODE BX Left 02/23/2014   Procedure: BREAST LUMPECTOMY WITH NEEDLE LOCALIZATION AND AXILLARY SENTINEL LYMPH NODE BIOPSY;  Surgeon: Excell Seltzer, MD;  Location: [facility];  Service: General;  Laterality: Left;  . BREAST SURGERY  2011   left breast biposy  . CHOLECYSTECTOMY  1990  . COLONOSCOPY W/ POLYPECTOMY  06/2009  . EYE SURGERY Right 2013   exc. warts from underneath eyelid  . INCONTINENCE SURGERY  2004  . KNEE ARTHROSCOPY Bilateral    x 6 each knee  . LIGAMENT REPAIR Right    thumb/wrist  . ORIF TOE FRACTURE Right    great toe  . PORTACATH PLACEMENT N/A 03/24/2014   Procedure: INSERTION PORT-A-CATH;  Surgeon: Excell Seltzer, MD;  Location: [facility];  Service: General;  Laterality: N/A;  . THYROIDECTOMY  2000  . TONSILLECTOMY AND ADENOIDECTOMY  2000  . TUMOR EXCISION     from thoracic spine    SOCIAL HISTORY: Social History  Substance Use Topics  . Smoking status: Never Smoker  . Smokeless tobacco: Never Used  . Alcohol use No    FAMILY HISTORY: Family History  Problem Relation Age of Onset  . Alcohol abuse Mother   . Arthritis Mother   . Hypertension Mother   . Bipolar disorder Mother   . Breast cancer Mother 71       unconfirmed  . Lung cancer Mother 90       smoker  . Hyperlipidemia Mother   . Stroke Mother   . Cancer Mother   . Depression Mother   . Anxiety disorder Mother   . Alcoholism Mother   . Drug abuse Mother   . Eating disorder Mother   . Obesity Mother   . Alcohol abuse Father   . Cancer Father        lung  . Hyperlipidemia Father   . Kidney disease Father   . Diabetes Father   . Hypertension Father   . Cystic kidney disease Father   . Thyroid disease Father   . Liver disease Father   . Alcoholism  Father   . Arthritis Maternal Grandmother   . Diabetes Paternal Grandmother   . Thyroid cancer Sister 47       type?; currently 80  . Other Sister        ovarian tumor @ 59; TAH/BSO  . Thyroid cancer Brother        dx 79s; currently 39  . Breast cancer Maternal Aunt        dx 78s; deceased 50  . Thyroid cancer Paternal Aunt        All 3 paternal aunts with thyroid ca in 30s/40s  . Lung cancer Paternal Aunt        2 of 3 paternal aunts with lung cancer    ROS: Review of Systems  Constitutional: Positive for weight loss.  Respiratory: Negative for shortness of breath (on exertion).   Cardiovascular: Negative for chest pain.    PHYSICAL EXAM: Blood pressure 140/87, pulse 73, temperature 98.2 F (36.8 C), temperature source Oral, height 5\' 8"  (1.727 m), weight 266 lb (120.7 kg), SpO2 95 %. Body mass index is 40.45 kg/m. Physical Exam  Constitutional: She is oriented to person, place, and time. She appears well-developed and well-nourished.  Cardiovascular: Normal rate.   Pulmonary/Chest: Effort normal.  Musculoskeletal: Normal range of motion.  Neurological: She is oriented to person, place, and time.  Skin: Skin is warm and dry.  Psychiatric: She has a normal mood and affect. Her behavior is normal.  Vitals reviewed.   RECENT LABS AND TESTS: BMET    Component Value Date/Time  NA 142 09/20/2016 1045   NA 143 07/03/2016 0946   K 4.6 09/20/2016 1045   K 4.1 07/03/2016 0946   CL 100 09/20/2016 1045   CO2 29 09/20/2016 1045   CO2 29 07/03/2016 0946   GLUCOSE 93 09/20/2016 1045   GLUCOSE 115 07/03/2016 0946   BUN 12 09/20/2016 1045   BUN 8.8 07/03/2016 0946   CREATININE 0.92 09/20/2016 1045   CREATININE 0.9 07/03/2016 0946   CALCIUM 10.1 09/20/2016 1045   CALCIUM 9.8 07/03/2016 0946   GFRNONAA 67 09/20/2016 1045   GFRAA 77 09/20/2016 1045   Lab Results  Component Value Date   HGBA1C 5.6 09/20/2016   HGBA1C 5.7 (H) 04/10/2016   HGBA1C 5.9 (H) 10/25/2015    HGBA1C 5.7 (H) 05/10/2015   HGBA1C 5.8 (H) 12/17/2014   Lab Results  Component Value Date   INSULIN 21.8 09/20/2016   CBC    Component Value Date/Time   WBC 2.5 (L) 09/25/2016 1014   WBC 4.0 09/13/2015 1414   RBC 4.69 09/25/2016 1014   RBC 4.90 09/13/2015 1414   HGB 13.2 09/25/2016 1014   HCT 40.5 09/25/2016 1014   PLT 236 09/25/2016 1014   MCV 86.5 09/25/2016 1014   MCH 28.2 09/25/2016 1014   MCH 26.7 09/16/2014 1140   MCHC 32.6 09/25/2016 1014   MCHC 32.7 09/13/2015 1414   RDW 16.4 (H) 09/25/2016 1014   LYMPHSABS 0.9 09/25/2016 1014   MONOABS 0.4 09/25/2016 1014   EOSABS 0.0 09/25/2016 1014   EOSABS 0.0 09/20/2016 1045   BASOSABS 0.0 09/25/2016 1014   Iron/TIBC/Ferritin/ %Sat No results found for: IRON, TIBC, FERRITIN, IRONPCTSAT Lipid Panel     Component Value Date/Time   CHOL 177 09/20/2016 1045   TRIG 143 09/20/2016 1045   HDL 70 09/20/2016 1045   CHOLHDL 3 01/17/2016 1547   VLDL 33.0 01/17/2016 1547   LDLCALC 78 09/20/2016 1045   LDLDIRECT 102.0 07/08/2014 1107   Hepatic Function Panel     Component Value Date/Time   PROT 6.7 09/20/2016 1045   PROT 6.4 07/03/2016 0946   ALBUMIN 4.2 09/20/2016 1045   ALBUMIN 3.7 07/03/2016 0946   AST 30 09/20/2016 1045   AST 40 (H) 07/03/2016 0946   ALT 39 (H) 09/20/2016 1045   ALT 67 (H) 07/03/2016 0946   ALKPHOS 60 09/20/2016 1045   ALKPHOS 64 07/03/2016 0946   BILITOT 0.4 09/20/2016 1045   BILITOT 0.39 07/03/2016 0946   BILIDIR 0.1 08/11/2013 0918   IBILI 0.3 08/11/2013 0918      Component Value Date/Time   TSH 5.600 (H) 09/20/2016 1045   TSH 0.17 (L) 07/08/2014 1107   TSH 0.084 (L) 09/24/2013 1122    ASSESSMENT AND PLAN: Essential hypertension  Morbid obesity (HCC)  PLAN:  Hypertension We discussed sodium restriction, working on healthy weight loss, and a regular exercise program as the means to achieve improved blood pressure control. Michelle Kennedy agreed with this plan and agreed to follow up as  directed. We will continue to monitor her blood pressure as well as her progress with the above lifestyle modifications. She will continue her medications as prescribed and will watch for signs of hypotension as she continues her lifestyle modifications. Michelle Kennedy agrees to follow up with PCP for medication changes and will follow up with our clinic in 4 weeks.  We spent > than 50% of the 15 minute visit on the counseling as documented in the note.  Obesity Michelle Kennedy is currently in the action stage of  change. As such, her goal is to continue with weight loss efforts She has agreed to keep a food journal with 1100 to 1400 calories and 75+ grams of protein  Michelle Kennedy has been instructed to work up to a goal of 150 minutes of combined cardio and strengthening exercise per week for weight loss and overall health benefits. We discussed the following Behavioral Modification Strategies today: increasing lean protein intake, decreasing sodium intake and travel eating strategies   Michelle Kennedy has agreed to follow up with our clinic in 4 weeks. She was informed of the importance of frequent follow up visits to maximize her success with intensive lifestyle modifications for her multiple health conditions.  I, Michelle Kennedy, am acting as scribe for Michelle Nip, MD  I have reviewed the above documentation for accuracy and completeness, and I agree with the above. -Michelle Nip, MD  OBESITY BEHAVIORAL INTERVENTION VISIT  Today's visit was # 3 out of 22.  Starting weight: 274 lbs Starting date: 09/20/16 Today's weight : 266 lbs Today's date: 10/18/2016 Total lbs lost to date: 8 (Patients must lose 7 lbs in the first 6 months to continue with counseling)   ASK: We discussed the diagnosis of obesity with Michelle Kennedy today and Michelle Kennedy agreed to give [place] permission to discuss obesity behavioral modification therapy today.  ASSESS: Michelle Kennedy has the diagnosis of obesity and her BMI today is 40.5 Michelle Kennedy is in  the action stage of change   ADVISE: Michelle Kennedy was educated on the multiple health risks of obesity as well as the benefit of weight loss to improve her health. She was advised of the need for long term treatment and the importance of lifestyle modifications.  AGREE: Multiple dietary modification options and treatment options were discussed and  Michelle Kennedy agreed to keep a food journal with 1100 to 1400 calories and 75+ grams of protein  We discussed the following Behavioral Modification Strategies today: increasing lean protein intake, decreasing sodium intake and travel eating strategies

## 2016-10-24 DIAGNOSIS — G4733 Obstructive sleep apnea (adult) (pediatric): Secondary | ICD-10-CM | POA: Diagnosis not present

## 2016-11-06 MED FILL — LEVOTHYROXINE 150 MCG TAB: 150 | 30 days supply | Qty: 30 | Fill #1

## 2016-11-15 ENCOUNTER — Ambulatory Visit (INDEPENDENT_AMBULATORY_CARE_PROVIDER_SITE_OTHER): Payer: 59 | Admitting: Family Medicine

## 2016-11-15 ENCOUNTER — Encounter: Payer: Self-pay | Admitting: Family Medicine

## 2016-11-15 VITALS — BP 132/83 | HR 76 | Temp 98.3°F | Ht 68.0 in | Wt 266.0 lb

## 2016-11-15 VITALS — BP 159/96 | HR 77 | Ht 68.0 in | Wt 262.0 lb

## 2016-11-15 DIAGNOSIS — I10 Essential (primary) hypertension: Secondary | ICD-10-CM

## 2016-11-15 DIAGNOSIS — M25562 Pain in left knee: Secondary | ICD-10-CM

## 2016-11-15 DIAGNOSIS — M6530 Trigger finger, unspecified finger: Secondary | ICD-10-CM | POA: Diagnosis not present

## 2016-11-15 DIAGNOSIS — E669 Obesity, unspecified: Secondary | ICD-10-CM | POA: Diagnosis not present

## 2016-11-15 DIAGNOSIS — E559 Vitamin D deficiency, unspecified: Secondary | ICD-10-CM

## 2016-11-15 DIAGNOSIS — Z6841 Body Mass Index (BMI) 40.0 and over, adult: Secondary | ICD-10-CM

## 2016-11-15 DIAGNOSIS — IMO0001 Reserved for inherently not codable concepts without codable children: Secondary | ICD-10-CM

## 2016-11-15 MED ORDER — METHYLPREDNISOLONE ACETATE 40 MG/ML IJ SUSP
20.0000 mg | Freq: Once | INTRAMUSCULAR | Status: AC
  Administered 2016-11-15: 20 mg via INTRA_ARTICULAR

## 2016-11-15 MED ORDER — VITAMIN D (ERGOCALCIFEROL) 1.25 MG (50000 UNIT) PO CAPS: 50000.0000 [IU] | ORAL_CAPSULE | ORAL | 0 refills | Status: DC

## 2016-11-15 MED ORDER — METHYLPREDNISOLONE ACETATE 40 MG/ML IJ SUSP
40.0000 mg | Freq: Once | INTRAMUSCULAR | Status: AC
  Administered 2016-11-15: 40 mg via INTRA_ARTICULAR

## 2016-11-15 NOTE — Progress Notes (Signed)
Office: [phone]  /  Fax: [phone]   HPI:   Chief Complaint: OBESITY Michelle Kennedy is here to discuss her progress with her obesity treatment plan. She is on the  keep a food journal with 1100 to 1400 calories and 75+ grams of protein and is following her eating plan approximately 80 % of the time. She states she is walking 12,000 steps all day 7 times per week. Michelle Kennedy travelled outside of the country and had hard time journaling. She walked a lot in [place]. She is ready to get back on track. Her weight is 266 lb (120.7 kg) today and has maintained weight over a period pf 4 weeks since her last visit. She has lost 8 lbs since starting treatment with [place].  Vitamin D deficiency Michelle Kennedy has a diagnosis of vitamin D deficiency. She is currently taking vit D and denies nausea, vomiting or muscle weakness.  Hypothyroid Michelle Kennedy has a diagnosis of hypothyroidism. TSH elevated and T3/T4 is within normal limits She is on levothyroxine. She denies hot or cold intolerance or palpitations, but does admit to ongoing fatigue.    ALLERGIES: Allergies  Allergen Reactions  . Bee Venom Anaphylaxis  . Contrast Media [Iodinated Diagnostic Agents] Shortness Of Breath  . Iodine Other (See Comments)    CARDIAC ARREST  . Latex Anaphylaxis and Rash  . Penicillins Shortness Of Breath, Rash and Other (See Comments)    SEIZURE  . Shellfish Allergy Shortness Of Breath  . Aspirin Rash and Other (See Comments)    GI UPSET  . Erythromycin Swelling and Rash    SWELLING OF JOINTS  . Lidocaine Swelling    SWELLING OF MOUTH AND THROAT  . Symbicort [Budesonide-Formoterol Fumarate] Other (See Comments)    BURNING OF TONGUE AND LIPS  . Codeine Rash  . Compazine [Prochlorperazine Maleate] Rash    Rash on face,chest, arms, back  . Pentazocine Lactate Rash  . Sulfonamide Derivatives Rash    MEDICATIONS: Current Outpatient Prescriptions on File Prior to Visit  Medication Sig Dispense Refill  . albuterol  (PROAIR HFA) 108 (90 Base) MCG/ACT inhaler INHALE 2 PUFFS INTO THE LUNGS EVERY 6 HOURS AS NEEDED 3 Inhaler 0  . albuterol (PROVENTIL) (5 MG/ML) 0.5% nebulizer solution Take 2.5 mg by nebulization every 6 (six) hours as needed for wheezing or shortness of breath.    . anastrozole (ARIMIDEX) 1 MG tablet Take 1 mg by mouth daily.    . Calcium Carbonate (CALCIUM 500 PO) Take 1 tablet by mouth daily.     . diphenhydrAMINE (BENADRYL) 25 MG tablet Take 1 tablet an hour before MRI.  Then take 1 tablet at bedtime as needed, per your usual regimen. 30 tablet 0  . DULoxetine (CYMBALTA) 60 MG capsule TAKE 1 CAPSULE BY MOUTH  DAILY 90 capsule 1  . EPINEPHrine 0.3 mg/0.3 mL IJ SOAJ injection Inject 0.3 mLs (0.3 mg total) into the muscle once. 2 Device 0  . Ergocalciferol (VITAMIN D2) 400 UNITS TABS Take 1 tablet by mouth daily.    . Fluticasone-Salmeterol (ADVAIR DISKUS) 250-50 MCG/DOSE AEPB Inhale 1 puff into the lungs 2 (two) times daily. 180 each 1  . Investigational palbociclib (IBRANCE) 125 MG capsule [facility] AFT-05 [name] Take 1 capsule (125 mg total) by mouth daily. Take with food. Swallow whole. Do not chew. Take on days 1-21. Repeat every 28 days. 69 capsule 0  . levothyroxine (SYNTHROID, LEVOTHROID) 150 MCG tablet Take 1 tablet (150 mcg total) by mouth daily. (Patient taking differently: Take 175  mcg by mouth daily. ) 30 tablet 3  . meloxicam (MOBIC) 7.5 MG tablet TAKE 1 TABLET BY MOUTH  DAILY 90 tablet 1  . mirabegron ER (MYRBETRIQ) 50 MG TB24 tablet Take 1 tablet (50 mg total) by mouth daily. 90 tablet 1  . Multiple Vitamin (MULTIVITAMIN) tablet Take 1 tablet by mouth daily.      . nitroGLYCERIN (NITROSTAT) 0.4 MG SL tablet Place 1 tablet (0.4 mg total) under the tongue every 5 (five) minutes as needed for chest pain. 30 tablet 0  . Omega-3 Fatty Acids (FISH OIL) 1200 MG CAPS Take 1 capsule by mouth daily.      . simvastatin (ZOCOR) 40 MG tablet TAKE 1 TABLET BY MOUTH  DAILY 90 tablet 1  .  traMADol (ULTRAM) 50 MG tablet Take 50 mg by mouth every 6 (six) hours as needed.     No current facility-administered medications on file prior to visit.     PAST MEDICAL HISTORY: Past Medical History:  Diagnosis Date  . Abnormally small mouth   . Achilles tendon disorder, right   . Anemia   . Anxiety   . Arthritis    hips and knees  . Asthma    daily inhaler, prn inhaler and neb.  . Asthma due to environmental allergies   . Breast cancer ([facility]) 01/2014   left  . Cancer ([facility])   . Chest pain   . COPD (chronic obstructive pulmonary disease) ([facility])   . Dental bridge present    upper front and lower right  . Dental crowns present    x 3  . Depression   . Family history of anesthesia complication    twin brother aspirated and died on OR table, per pt.  . Fibromyalgia   . Gallbladder disease   . GERD (gastroesophageal reflux disease)   . H/O blood clots   . History of gastric ulcer    as a teenager  . History of seizure age 29   as a reaction to Penicillin - no seizures since  . History of stomach ulcers   . History of thyroid cancer    s/p thyroidectomy  . Hyperlipidemia   . Hypothyroidism   . Joint pain   . Left knee injury   . Liver disorder   . Lymphedema    left arm  . Menopause   . Migraines   . Multiple food allergies   . Obesity   . OSA (obstructive sleep apnea) 02/16/2016  . Palpitations   . Personal history of chemotherapy 2015  . Personal history of radiation therapy 2015   Left  . Prediabetes   . Rheumatoid arthritis ([facility])   . SOB (shortness of breath)   . Swelling of both ankles   . Tinnitus   . UTI (lower urinary tract infection) 02/17/2014  . Vitamin D deficiency     PAST SURGICAL HISTORY: Past Surgical History:  Procedure Laterality Date  . ABDOMINAL HYSTERECTOMY  2004   complete  . ACHILLES TENDON REPAIR Right   . APPENDECTOMY  2004  . BREAST LUMPECTOMY WITH NEEDLE LOCALIZATION AND AXILLARY SENTINEL LYMPH NODE BX Left 02/23/2014    Procedure: BREAST LUMPECTOMY WITH NEEDLE LOCALIZATION AND AXILLARY SENTINEL LYMPH NODE BIOPSY;  Surgeon: Excell Seltzer, MD;  Location: [facility];  Service: General;  Laterality: Left;  . BREAST SURGERY  2011   left breast biposy  . CHOLECYSTECTOMY  1990  . COLONOSCOPY W/ POLYPECTOMY  06/2009  . EYE SURGERY Right 2013  exc. warts from underneath eyelid  . INCONTINENCE SURGERY  2004  . KNEE ARTHROSCOPY Bilateral    x 6 each knee  . LIGAMENT REPAIR Right    thumb/wrist  . ORIF TOE FRACTURE Right    great toe  . PORTACATH PLACEMENT N/A 03/24/2014   Procedure: INSERTION PORT-A-CATH;  Surgeon: Excell Seltzer, MD;  Location: [facility];  Service: General;  Laterality: N/A;  . THYROIDECTOMY  2000  . TONSILLECTOMY AND ADENOIDECTOMY  2000  . TUMOR EXCISION     from thoracic spine    SOCIAL HISTORY: Social History  Substance Use Topics  . Smoking status: Never Smoker  . Smokeless tobacco: Never Used  . Alcohol use No    FAMILY HISTORY: Family History  Problem Relation Age of Onset  . Alcohol abuse Mother   . Arthritis Mother   . Hypertension Mother   . Bipolar disorder Mother   . Breast cancer Mother 40       unconfirmed  . Lung cancer Mother 51       smoker  . Hyperlipidemia Mother   . Stroke Mother   . Cancer Mother   . Depression Mother   . Anxiety disorder Mother   . Alcoholism Mother   . Drug abuse Mother   . Eating disorder Mother   . Obesity Mother   . Alcohol abuse Father   . Cancer Father        lung  . Hyperlipidemia Father   . Kidney disease Father   . Diabetes Father   . Hypertension Father   . Cystic kidney disease Father   . Thyroid disease Father   . Liver disease Father   . Alcoholism Father   . Arthritis Maternal Grandmother   . Diabetes Paternal Grandmother   . Thyroid cancer Sister 43       type?; currently 61  . Other Sister        ovarian tumor @ 85; TAH/BSO  . Thyroid cancer Brother        dx 57s;  currently 73  . Breast cancer Maternal Aunt        dx 82s; deceased 41  . Thyroid cancer Paternal Aunt        All 3 paternal aunts with thyroid ca in 30s/40s  . Lung cancer Paternal Aunt        2 of 3 paternal aunts with lung cancer    ROS: Review of Systems  Constitutional: Positive for malaise/fatigue. Negative for weight loss.  Respiratory: Shortness of breath: on exertion.   Cardiovascular: Negative for palpitations.  Gastrointestinal: Negative for nausea and vomiting.  Musculoskeletal:       Negative muscle weakness  Endo/Heme/Allergies:       Negative Heat/Cold Intolerance    PHYSICAL EXAM: Blood pressure 132/83, pulse 76, temperature 98.3 F (36.8 C), temperature source Oral, height 5\' 8"  (1.727 m), weight 266 lb (120.7 kg), SpO2 95 %. Body mass index is 40.45 kg/m. Physical Exam  Constitutional: She is oriented to person, place, and time. She appears well-developed and well-nourished.  Cardiovascular: Normal rate.   Pulmonary/Chest: Effort normal.  Musculoskeletal: Normal range of motion.  Neurological: She is oriented to person, place, and time.  Skin: Skin is warm and dry.  Psychiatric: She has a normal mood and affect. Her behavior is normal.  Vitals reviewed.   RECENT LABS AND TESTS: BMET    Component Value Date/Time   NA 142 09/20/2016 1045   NA 143 07/03/2016 0946  K 4.6 09/20/2016 1045   K 4.1 07/03/2016 0946   CL 100 09/20/2016 1045   CO2 29 09/20/2016 1045   CO2 29 07/03/2016 0946   GLUCOSE 93 09/20/2016 1045   GLUCOSE 115 07/03/2016 0946   BUN 12 09/20/2016 1045   BUN 8.8 07/03/2016 0946   CREATININE 0.92 09/20/2016 1045   CREATININE 0.9 07/03/2016 0946   CALCIUM 10.1 09/20/2016 1045   CALCIUM 9.8 07/03/2016 0946   GFRNONAA 67 09/20/2016 1045   GFRAA 77 09/20/2016 1045   Lab Results  Component Value Date   HGBA1C 5.6 09/20/2016   HGBA1C 5.7 (H) 04/10/2016   HGBA1C 5.9 (H) 10/25/2015   HGBA1C 5.7 (H) 05/10/2015   HGBA1C 5.8 (H)  12/17/2014   Lab Results  Component Value Date   INSULIN 21.8 09/20/2016   CBC    Component Value Date/Time   WBC 2.5 (L) 09/25/2016 1014   WBC 4.0 09/13/2015 1414   RBC 4.69 09/25/2016 1014   RBC 4.90 09/13/2015 1414   HGB 13.2 09/25/2016 1014   HCT 40.5 09/25/2016 1014   PLT 236 09/25/2016 1014   MCV 86.5 09/25/2016 1014   MCH 28.2 09/25/2016 1014   MCH 26.7 09/16/2014 1140   MCHC 32.6 09/25/2016 1014   MCHC 32.7 09/13/2015 1414   RDW 16.4 (H) 09/25/2016 1014   LYMPHSABS 0.9 09/25/2016 1014   MONOABS 0.4 09/25/2016 1014   EOSABS 0.0 09/25/2016 1014   EOSABS 0.0 09/20/2016 1045   BASOSABS 0.0 09/25/2016 1014   Iron/TIBC/Ferritin/ %Sat No results found for: IRON, TIBC, FERRITIN, IRONPCTSAT Lipid Panel     Component Value Date/Time   CHOL 177 09/20/2016 1045   TRIG 143 09/20/2016 1045   HDL 70 09/20/2016 1045   CHOLHDL 3 01/17/2016 1547   VLDL 33.0 01/17/2016 1547   LDLCALC 78 09/20/2016 1045   LDLDIRECT 102.0 07/08/2014 1107   Hepatic Function Panel     Component Value Date/Time   PROT 6.7 09/20/2016 1045   PROT 6.4 07/03/2016 0946   ALBUMIN 4.2 09/20/2016 1045   ALBUMIN 3.7 07/03/2016 0946   AST 30 09/20/2016 1045   AST 40 (H) 07/03/2016 0946   ALT 39 (H) 09/20/2016 1045   ALT 67 (H) 07/03/2016 0946   ALKPHOS 60 09/20/2016 1045   ALKPHOS 64 07/03/2016 0946   BILITOT 0.4 09/20/2016 1045   BILITOT 0.39 07/03/2016 0946   BILIDIR 0.1 08/11/2013 0918   IBILI 0.3 08/11/2013 0918      Component Value Date/Time   TSH 5.600 (H) 09/20/2016 1045   TSH 0.17 (L) 07/08/2014 1107   TSH 0.084 (L) 09/24/2013 1122    ASSESSMENT AND PLAN: Vitamin D deficiency - Plan: Vitamin D, Ergocalciferol, (DRISDOL) 50000 units CAPS capsule  Essential hypertension  Class 3 obesity with serious comorbidity and body mass index (BMI) of 40.0 to 44.9 in adult, unspecified obesity type (HCC)  PLAN:  Vitamin D Deficiency Michelle Kennedy was informed that low vitamin D levels  contributes to fatigue and are associated with obesity, breast, and colon cancer. She agrees to continue to take prescription Vit D @50 ,000 IU every week, we will refill for 1 month and will follow up for routine testing of vitamin D, at least 2-3 times per year. She was informed of the risk of over-replacement of vitamin D and agrees to not increase her dose unless he discusses this with [place] first. Deangela agrees to follow up with our clinic in 2 to 3 weeks.  Hypothyroid Michelle Kennedy was informed of the  importance of good thyroid control to help with weight loss efforts. She was also informed that supertheraputic thyroid levels are dangerous and will not improve weight loss results. Michelle Kennedy agrees to continue levothyroxine and will re-check labs in 2 months. Michelle Kennedy agrees to follow up with our clinic in 2 to 3 weeks.  Obesity Michelle Kennedy is currently in the action stage of change. As such, her goal is to continue with weight loss efforts She has agreed to keep a food journal with 1100 to 1400 calories and 75+ grams of protein  Michelle Kennedy has been instructed to work up to a goal of 150 minutes of combined cardio and strengthening exercise per week for weight loss and overall health benefits. We discussed the following Behavioral Modification Strategies today: increasing lean protein intake and keep a strict food journal  Michelle Kennedy has agreed to follow up with our clinic in 2 to 3 weeks. She was informed of the importance of frequent follow up visits to maximize her success with intensive lifestyle modifications for her multiple health conditions.  I, Doreene Nest, am acting as transcriptionist for Dennard Nip, MD  I have reviewed the above documentation for accuracy and completeness, and I agree with the above. -Dennard Nip, MD  OBESITY BEHAVIORAL INTERVENTION VISIT  Today's visit was # 4 out of 22.  Starting weight: 274 lbs Starting date: 09/20/16 Today's weight : 11/15/16 Today's date:  11/15/2016 Total lbs lost to date: 8 (Patients must lose 7 lbs in the first 6 months to continue with counseling)   ASK: We discussed the diagnosis of obesity with Michelle Kennedy today and Michelle Kennedy agreed to give [place] permission to discuss obesity behavioral modification therapy today.  ASSESS: Michelle Kennedy has the diagnosis of obesity and her BMI today is 40.5 Michelle Kennedy is in the action stage of change   ADVISE: Michelle Kennedy was educated on the multiple health risks of obesity as well as the benefit of weight loss to improve her health. She was advised of the need for long term treatment and the importance of lifestyle modifications.  AGREE: Multiple dietary modification options and treatment options were discussed and  Michelle Kennedy agreed to keep a food journal with 1100 to 1400 calories and 75+ grams of protein  We discussed the following Behavioral Modification Strategies today: increasing lean protein intake and keep a strict food journal

## 2016-11-15 NOTE — Patient Instructions (Addendum)
You have a trigger thumb. You were given a cortisone injection today. Wear wrist brace as you have been for the next week then as needed. Heat may help with motion. Follow up with me in 1 month.  Your knee pain is due to arthritis. These are the different medications you can take for this: Tylenol 500mg  1-2 tabs three times a day for pain. Ibuprofen 600mg  three times a day with food OR Aleve 1-2 tabs twice a day with food Capsaicin, aspercreme, or biofreeze topically up to four times a day may also help with pain. Some supplements that may help for arthritis: Boswellia extract, curcumin, pycnogenol Cortisone injections are an option - you were given this today. If cortisone injections do not help, there are different types of shots that may help but they take longer to take effect - we will look into getting approval for these for you. It's important that you continue to stay active. Straight leg raises, knee extensions 3 sets of 10 once a day (add ankle weight if these become too easy). Consider physical therapy to strengthen muscles around the joint that hurts to take pressure off of the joint itself. Shoe inserts with good arch support may be helpful. Walker or cane if needed. Heat or ice 15 minutes at a time 3-4 times a day as needed to help with pain. Water aerobics and cycling with low resistance are the best two types of exercise for arthritis.

## 2016-11-16 ENCOUNTER — Ambulatory Visit: Payer: Self-pay | Admitting: Family Medicine

## 2016-11-16 ENCOUNTER — Telehealth: Payer: Self-pay | Admitting: Family Medicine

## 2016-11-16 NOTE — Progress Notes (Addendum)
PCP: Debbrah Alar, NP  Subjective:   HPI: Patient is a 61 y.o. female here for left knee and thumb pain.  Patient reports over past 2 weeks her left thumb has been locking again. Had trigger finger last year and did well after injection. Left knee started bothering again past few weeks to 5/10level though 9/10 at worst and sharp. Difficulty sitting to standing. Using a thumb spica brace and taking motrin. No skin changes, numbness.  Past Medical History:  Diagnosis Date  . Abnormally small mouth   . Achilles tendon disorder, right   . Anemia   . Anxiety   . Arthritis    hips and knees  . Asthma    daily inhaler, prn inhaler and neb.  . Asthma due to environmental allergies   . Breast cancer ([place]) 01/2014   left  . Cancer ([place])   . Chest pain   . COPD (chronic obstructive pulmonary disease) ([place])   . Dental bridge present    upper front and lower right  . Dental crowns present    x 3  . Depression   . Family history of anesthesia complication    twin brother aspirated and died on OR table, per pt.  . Fibromyalgia   . Gallbladder disease   . GERD (gastroesophageal reflux disease)   . H/O blood clots   . History of gastric ulcer    as a teenager  . History of seizure age 37   as a reaction to Penicillin - no seizures since  . History of stomach ulcers   . History of thyroid cancer    s/p thyroidectomy  . Hyperlipidemia   . Hypothyroidism   . Joint pain   . Left knee injury   . Liver disorder   . Lymphedema    left arm  . Menopause   . Migraines   . Multiple food allergies   . Obesity   . OSA (obstructive sleep apnea) 02/16/2016  . Palpitations   . Personal history of chemotherapy 2015  . Personal history of radiation therapy 2015   Left  . Prediabetes   . Rheumatoid arthritis ([place])   . SOB (shortness of breath)   . Swelling of both ankles   . Tinnitus   . UTI (lower urinary tract infection) 02/17/2014  . Vitamin D deficiency     Current  Outpatient Prescriptions on File Prior to Visit  Medication Sig Dispense Refill  . albuterol (PROAIR HFA) 108 (90 Base) MCG/ACT inhaler INHALE 2 PUFFS INTO THE LUNGS EVERY 6 HOURS AS NEEDED 3 Inhaler 0  . albuterol (PROVENTIL) (5 MG/ML) 0.5% nebulizer solution Take 2.5 mg by nebulization every 6 (six) hours as needed for wheezing or shortness of breath.    . anastrozole (ARIMIDEX) 1 MG tablet Take 1 mg by mouth daily.    . Calcium Carbonate (CALCIUM 500 PO) Take 1 tablet by mouth daily.     . diphenhydrAMINE (BENADRYL) 25 MG tablet Take 1 tablet an hour before MRI.  Then take 1 tablet at bedtime as needed, per your usual regimen. 30 tablet 0  . DULoxetine (CYMBALTA) 60 MG capsule TAKE 1 CAPSULE BY MOUTH  DAILY 90 capsule 1  . EPINEPHrine 0.3 mg/0.3 mL IJ SOAJ injection Inject 0.3 mLs (0.3 mg total) into the muscle once. 2 Device 0  . Ergocalciferol (VITAMIN D2) 400 UNITS TABS Take 1 tablet by mouth daily.    . Fluticasone-Salmeterol (ADVAIR DISKUS) 250-50 MCG/DOSE AEPB Inhale 1 puff into the lungs  2 (two) times daily. 180 each 1  . Investigational palbociclib (IBRANCE) 125 MG capsule [facility] AFT-05 [name] Take 1 capsule (125 mg total) by mouth daily. Take with food. Swallow whole. Do not chew. Take on days 1-21. Repeat every 28 days. 69 capsule 0  . levothyroxine (SYNTHROID, LEVOTHROID) 150 MCG tablet Take 1 tablet (150 mcg total) by mouth daily. (Patient taking differently: Take 175 mcg by mouth daily. ) 30 tablet 3  . meloxicam (MOBIC) 7.5 MG tablet TAKE 1 TABLET BY MOUTH  DAILY 90 tablet 1  . mirabegron ER (MYRBETRIQ) 50 MG TB24 tablet Take 1 tablet (50 mg total) by mouth daily. 90 tablet 1  . Multiple Vitamin (MULTIVITAMIN) tablet Take 1 tablet by mouth daily.      . nitroGLYCERIN (NITROSTAT) 0.4 MG SL tablet Place 1 tablet (0.4 mg total) under the tongue every 5 (five) minutes as needed for chest pain. 30 tablet 0  . Omega-3 Fatty Acids (FISH OIL) 1200 MG CAPS Take 1 capsule by  mouth daily.      . simvastatin (ZOCOR) 40 MG tablet TAKE 1 TABLET BY MOUTH  DAILY 90 tablet 1  . traMADol (ULTRAM) 50 MG tablet Take 50 mg by mouth every 6 (six) hours as needed.    . Vitamin D, Ergocalciferol, (DRISDOL) 50000 units CAPS capsule Take 1 capsule (50,000 Units total) by mouth every 7 (seven) days. 4 capsule 0   No current facility-administered medications on file prior to visit.     Past Surgical History:  Procedure Laterality Date  . ABDOMINAL HYSTERECTOMY  2004   complete  . ACHILLES TENDON REPAIR Right   . APPENDECTOMY  2004  . BREAST LUMPECTOMY WITH NEEDLE LOCALIZATION AND AXILLARY SENTINEL LYMPH NODE BX Left 02/23/2014   Procedure: BREAST LUMPECTOMY WITH NEEDLE LOCALIZATION AND AXILLARY SENTINEL LYMPH NODE BIOPSY;  Surgeon: Excell Seltzer, MD;  Location: [facility];  Service: General;  Laterality: Left;  . BREAST SURGERY  2011   left breast biposy  . CHOLECYSTECTOMY  1990  . COLONOSCOPY W/ POLYPECTOMY  06/2009  . EYE SURGERY Right 2013   exc. warts from underneath eyelid  . INCONTINENCE SURGERY  2004  . KNEE ARTHROSCOPY Bilateral    x 6 each knee  . LIGAMENT REPAIR Right    thumb/wrist  . ORIF TOE FRACTURE Right    great toe  . PORTACATH PLACEMENT N/A 03/24/2014   Procedure: INSERTION PORT-A-CATH;  Surgeon: Excell Seltzer, MD;  Location: [facility];  Service: General;  Laterality: N/A;  . THYROIDECTOMY  2000  . TONSILLECTOMY AND ADENOIDECTOMY  2000  . TUMOR EXCISION     from thoracic spine    Allergies  Allergen Reactions  . Bee Venom Anaphylaxis  . Contrast Media [Iodinated Diagnostic Agents] Shortness Of Breath  . Iodine Other (See Comments)    CARDIAC ARREST  . Latex Anaphylaxis and Rash  . Penicillins Shortness Of Breath, Rash and Other (See Comments)    SEIZURE  . Shellfish Allergy Shortness Of Breath  . Aspirin Rash and Other (See Comments)    GI UPSET  . Erythromycin Swelling and Rash    SWELLING OF JOINTS   . Lidocaine Swelling    SWELLING OF MOUTH AND THROAT  . Symbicort [Budesonide-Formoterol Fumarate] Other (See Comments)    BURNING OF TONGUE AND LIPS  . Codeine Rash  . Compazine [Prochlorperazine Maleate] Rash    Rash on face,chest, arms, back  . Pentazocine Lactate Rash  . Sulfonamide Derivatives Rash  Social History   Social History  . Marital status: Married    Spouse name: N/A  . Number of children: N/A  . Years of education: N/A   Occupational History  . 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 Smoking status: Never Smoker  . Smokeless tobacco: Never Used  . Alcohol use No  . Drug use: No  . Sexual activity: Not Currently    Partners: Male     Comment: menarche age 68, P 2, first birth age 13, menopause age 24, Premarin x 10 yrs   Other Topics Concern  . Not on file   Social History Narrative   Regular exercise: yes          Family History  Problem Relation Age of Onset  . Alcohol abuse Mother   . Arthritis Mother   . Hypertension Mother   . Bipolar disorder Mother   . Breast cancer Mother 39       unconfirmed  . Lung cancer Mother 1       smoker  . Hyperlipidemia Mother   . Stroke Mother   . Cancer Mother   . Depression Mother   . Anxiety disorder Mother   . Alcoholism Mother   . Drug abuse Mother   . Eating disorder Mother   . Obesity Mother   . Alcohol abuse Father   . Cancer Father        lung  . Hyperlipidemia Father   . Kidney disease Father   . Diabetes Father   . Hypertension Father   . Cystic kidney disease Father   . Thyroid disease Father   . Liver disease Father   . Alcoholism Father   . Arthritis Maternal Grandmother   . Diabetes Paternal Grandmother   . Thyroid cancer Sister 47       type?; currently 98  . Other Sister        ovarian tumor @ 4; TAH/BSO  . Thyroid cancer Brother        dx 27s; currently 49  . Breast cancer Maternal Aunt        dx 57s; deceased 20  . Thyroid cancer Paternal Aunt         All 3 paternal aunts with thyroid ca in 30s/40s  . Lung cancer Paternal Aunt        2 of 3 paternal aunts with lung cancer    BP (!) 159/96   Pulse 77   Ht 5\' 8"  (1.727 m)   Wt 262 lb (118.8 kg)   BMI 39.84 kg/m   Review of Systems: See HPI above.     Objective:  Physical Exam:  Gen: NAD, comfortable in exam room  Left knee: No gross deformity, ecchymoses.  Mild effusion. TTP medial and lateral joint lines. FROM. Negative ant/post drawers. Negative valgus/varus testing. Negative lachmanns. Negative mcmurrays, apleys, patellar apprehension. NV intact distally.   Left thumb: Catching reproduced with flexion/extension of IP joint.  No malrotation or angulation, other deformity. FROM IP, MCP, CMC joints. Tenderness at A1 pulley but none at wrist, 1st CMC joint, 1st dorsal compartment. Negative finkelsteins. NVI distally.  Assessment & Plan:  1. Left knee pain - 2/2 known severe arthritis.  Injection given today.  Discussed tylenol, aleve/ibuprofen, topical medications, supplements that may help.  Home exercises reviewed.  Heat/ice.  After informed written consent, patient was lying supine on exam table. Left knee was prepped with alcohol swab and utilizing superolateral approach with ultrasound guidance,  patient's left knee was injected intraarticularly with 3:1 bupivicaine: depomedrol. Patient tolerated the procedure well without immediate complications.  2. Left trigger thumb - repeated injection.  Thumb spica brace for next week then as needed.  Heat as needed.  F/u in 1 month.  After informed written consent patient was seated on exam table.  Area overlying left 1st digit A1 pulley prepped with alcohol swab, injected with 0.5:0.70mL bupivicaine: depomedrol.  Patient tolerated procedure well without immediate complications.

## 2016-11-16 NOTE — Telephone Encounter (Signed)
Great, thanks

## 2016-11-16 NOTE — Assessment & Plan Note (Signed)
2/2 known severe arthritis.  Injection given today.  Discussed tylenol, aleve/ibuprofen, topical medications, supplements that may help.  Home exercises reviewed.  Heat/ice.  After informed written consent, patient was seated on exam table. Left knee was prepped with alcohol swab and utilizing superolateral approach with ultrasound guidance, patient's left knee was injected intraarticularly with 3:1 bupivicaine: depomedrol. Patient tolerated the procedure well without immediate complications.

## 2016-11-16 NOTE — Assessment & Plan Note (Signed)
Left trigger thumb - repeated injection.  Thumb spica brace for next week then as needed.  Heat as needed.  F/u in 1 month.  After informed written consent patient was seated on exam table.  Area overlying left 1st digit A1 pulley prepped with alcohol swab, injected with 0.5:0.67mL bupivicaine: depomedrol.  Patient tolerated procedure well without immediate complications.

## 2016-11-23 DIAGNOSIS — G4733 Obstructive sleep apnea (adult) (pediatric): Secondary | ICD-10-CM | POA: Diagnosis not present

## 2016-11-28 ENCOUNTER — Telehealth: Payer: Self-pay | Admitting: Family

## 2016-11-28 DIAGNOSIS — G5603 Carpal tunnel syndrome, bilateral upper limbs: Secondary | ICD-10-CM

## 2016-11-28 NOTE — Telephone Encounter (Signed)
Pt called in to request a referral for a hand and wrist Dr. Abbott Kennedy says that she have carpal tunnel .     CB: [phone]

## 2016-11-28 NOTE — Telephone Encounter (Signed)
Pt states she discussed this with PCP a couple of years ago and bought braces to wear at night.  Wears wrist braces at night. Can't pick up bag of flour due to the pain. Wrists are hurting, fingers are numb and wearing braces during the day now. Cannot pivot hands due to pain. Has discussed this with Dr Barbaraann Barthel and rheumatologist and both have advised her to see surgeon for her carpal tunnel. Pt is requesting referral?  Please advise?

## 2016-12-04 ENCOUNTER — Ambulatory Visit (INDEPENDENT_AMBULATORY_CARE_PROVIDER_SITE_OTHER): Payer: 59 | Admitting: Family Medicine

## 2016-12-04 VITALS — BP 125/79 | HR 74 | Temp 98.0°F | Ht 68.0 in | Wt 267.0 lb

## 2016-12-04 DIAGNOSIS — R600 Localized edema: Secondary | ICD-10-CM

## 2016-12-04 DIAGNOSIS — E669 Obesity, unspecified: Secondary | ICD-10-CM

## 2016-12-04 DIAGNOSIS — IMO0001 Reserved for inherently not codable concepts without codable children: Secondary | ICD-10-CM

## 2016-12-04 DIAGNOSIS — Z6841 Body Mass Index (BMI) 40.0 and over, adult: Secondary | ICD-10-CM

## 2016-12-04 NOTE — Progress Notes (Signed)
Office: [phone]  /  Fax: [phone]   HPI:   Chief Complaint: OBESITY Michelle Kennedy is here to discuss her progress with her obesity treatment plan. She is on the  keep a food journal with 1100 to 1400 calories and 75+ grams of protein daily and is following her eating plan approximately 80 % of the time. She states she is swimming for 40 minutes 7 times per week. Michelle Kennedy is retaining fluid with increased heat. She is working on increasing H2O intake and increasing protein. She did note some husband sabotage. Her weight is 267 lb (121.1 kg) today and has had a weight gain of 1 pound over a period of 2 to 3 weeks since her last visit. She has lost 7 lbs since starting treatment with [place].  Bilateral Lower Extremity Edema Michelle Kennedy notes increased edema in both legs and hands. She has been in the heat more and may have increased simple carbohydrates/sodium. She denies shortness of breath at rest or orthopnea.   ALLERGIES: Allergies  Allergen Reactions  . Bee Venom Anaphylaxis  . Contrast Media [Iodinated Diagnostic Agents] Shortness Of Breath  . Iodine Other (See Comments)    CARDIAC ARREST  . Latex Anaphylaxis and Rash  . Penicillins Shortness Of Breath, Rash and Other (See Comments)    SEIZURE  . Shellfish Allergy Shortness Of Breath  . Aspirin Rash and Other (See Comments)    GI UPSET  . Erythromycin Swelling and Rash    SWELLING OF JOINTS  . Lidocaine Swelling    SWELLING OF MOUTH AND THROAT  . Symbicort [Budesonide-Formoterol Fumarate] Other (See Comments)    BURNING OF TONGUE AND LIPS  . Codeine Rash  . Compazine [Prochlorperazine Maleate] Rash    Rash on face,chest, arms, back  . Pentazocine Lactate Rash  . Sulfonamide Derivatives Rash    MEDICATIONS: Current Outpatient Prescriptions on File Prior to Visit  Medication Sig Dispense Refill  . albuterol (PROAIR HFA) 108 (90 Base) MCG/ACT inhaler INHALE 2 PUFFS INTO THE LUNGS EVERY 6 HOURS AS NEEDED 3 Inhaler 0  .  albuterol (PROVENTIL) (5 MG/ML) 0.5% nebulizer solution Take 2.5 mg by nebulization every 6 (six) hours as needed for wheezing or shortness of breath.    . anastrozole (ARIMIDEX) 1 MG tablet Take 1 mg by mouth daily.    . Calcium Carbonate (CALCIUM 500 PO) Take 1 tablet by mouth daily.     . diphenhydrAMINE (BENADRYL) 25 MG tablet Take 1 tablet an hour before MRI.  Then take 1 tablet at bedtime as needed, per your usual regimen. 30 tablet 0  . DULoxetine (CYMBALTA) 60 MG capsule TAKE 1 CAPSULE BY MOUTH  DAILY 90 capsule 1  . EPINEPHrine 0.3 mg/0.3 mL IJ SOAJ injection Inject 0.3 mLs (0.3 mg total) into the muscle once. 2 Device 0  . Ergocalciferol (VITAMIN D2) 400 UNITS TABS Take 1 tablet by mouth daily.    . Fluticasone-Salmeterol (ADVAIR DISKUS) 250-50 MCG/DOSE AEPB Inhale 1 puff into the lungs 2 (two) times daily. 180 each 1  . Investigational palbociclib (IBRANCE) 125 MG capsule [facility] AFT-05 [name] Take 1 capsule (125 mg total) by mouth daily. Take with food. Swallow whole. Do not chew. Take on days 1-21. Repeat every 28 days. 69 capsule 0  . levothyroxine (SYNTHROID, LEVOTHROID) 150 MCG tablet Take 1 tablet (150 mcg total) by mouth daily. (Patient taking differently: Take 175 mcg by mouth daily. ) 30 tablet 3  . meloxicam (MOBIC) 7.5 MG tablet TAKE 1 TABLET BY  MOUTH  DAILY 90 tablet 1  . mirabegron ER (MYRBETRIQ) 50 MG TB24 tablet Take 1 tablet (50 mg total) by mouth daily. 90 tablet 1  . Multiple Vitamin (MULTIVITAMIN) tablet Take 1 tablet by mouth daily.      . nitroGLYCERIN (NITROSTAT) 0.4 MG SL tablet Place 1 tablet (0.4 mg total) under the tongue every 5 (five) minutes as needed for chest pain. 30 tablet 0  . Omega-3 Fatty Acids (FISH OIL) 1200 MG CAPS Take 1 capsule by mouth daily.      . simvastatin (ZOCOR) 40 MG tablet TAKE 1 TABLET BY MOUTH  DAILY 90 tablet 1  . traMADol (ULTRAM) 50 MG tablet Take 50 mg by mouth every 6 (six) hours as needed.    . Vitamin D,  Ergocalciferol, (DRISDOL) 50000 units CAPS capsule Take 1 capsule (50,000 Units total) by mouth every 7 (seven) days. 4 capsule 0   No current facility-administered medications on file prior to visit.     PAST MEDICAL HISTORY: Past Medical History:  Diagnosis Date  . Abnormally small mouth   . Achilles tendon disorder, right   . Anemia   . Anxiety   . Arthritis    hips and knees  . Asthma    daily inhaler, prn inhaler and neb.  . Asthma due to environmental allergies   . Breast cancer ([place]) 01/2014   left  . Cancer ([place])   . Chest pain   . COPD (chronic obstructive pulmonary disease) ([place])   . Dental bridge present    upper front and lower right  . Dental crowns present    x 3  . Depression   . Family history of anesthesia complication    twin brother aspirated and died on OR table, per pt.  . Fibromyalgia   . Gallbladder disease   . GERD (gastroesophageal reflux disease)   . H/O blood clots   . History of gastric ulcer    as a teenager  . History of seizure age 53   as a reaction to Penicillin - no seizures since  . History of stomach ulcers   . History of thyroid cancer    s/p thyroidectomy  . Hyperlipidemia   . Hypothyroidism   . Joint pain   . Left knee injury   . Liver disorder   . Lymphedema    left arm  . Menopause   . Migraines   . Multiple food allergies   . Obesity   . OSA (obstructive sleep apnea) 02/16/2016  . Palpitations   . Personal history of chemotherapy 2015  . Personal history of radiation therapy 2015   Left  . Prediabetes   . Rheumatoid arthritis ([place])   . SOB (shortness of breath)   . Swelling of both ankles   . Tinnitus   . UTI (lower urinary tract infection) 02/17/2014  . Vitamin D deficiency     PAST SURGICAL HISTORY: Past Surgical History:  Procedure Laterality Date  . ABDOMINAL HYSTERECTOMY  2004   complete  . ACHILLES TENDON REPAIR Right   . APPENDECTOMY  2004  . BREAST LUMPECTOMY WITH NEEDLE LOCALIZATION AND AXILLARY  SENTINEL LYMPH NODE BX Left 02/23/2014   Procedure: BREAST LUMPECTOMY WITH NEEDLE LOCALIZATION AND AXILLARY SENTINEL LYMPH NODE BIOPSY;  Surgeon: Excell Seltzer, MD;  Location: [facility];  Service: General;  Laterality: Left;  . BREAST SURGERY  2011   left breast biposy  . CHOLECYSTECTOMY  1990  . COLONOSCOPY W/ POLYPECTOMY  06/2009  .  EYE SURGERY Right 2013   exc. warts from underneath eyelid  . INCONTINENCE SURGERY  2004  . KNEE ARTHROSCOPY Bilateral    x 6 each knee  . LIGAMENT REPAIR Right    thumb/wrist  . ORIF TOE FRACTURE Right    great toe  . PORTACATH PLACEMENT N/A 03/24/2014   Procedure: INSERTION PORT-A-CATH;  Surgeon: Excell Seltzer, MD;  Location: [facility];  Service: General;  Laterality: N/A;  . THYROIDECTOMY  2000  . TONSILLECTOMY AND ADENOIDECTOMY  2000  . TUMOR EXCISION     from thoracic spine    SOCIAL HISTORY: Social History  Substance Use Topics  . Smoking status: Never Smoker  . Smokeless tobacco: Never Used  . Alcohol use No    FAMILY HISTORY: Family History  Problem Relation Age of Onset  . Alcohol abuse Mother   . Arthritis Mother   . Hypertension Mother   . Bipolar disorder Mother   . Breast cancer Mother 65       unconfirmed  . Lung cancer Mother 51       smoker  . Hyperlipidemia Mother   . Stroke Mother   . Cancer Mother   . Depression Mother   . Anxiety disorder Mother   . Alcoholism Mother   . Drug abuse Mother   . Eating disorder Mother   . Obesity Mother   . Alcohol abuse Father   . Cancer Father        lung  . Hyperlipidemia Father   . Kidney disease Father   . Diabetes Father   . Hypertension Father   . Cystic kidney disease Father   . Thyroid disease Father   . Liver disease Father   . Alcoholism Father   . Arthritis Maternal Grandmother   . Diabetes Paternal Grandmother   . Thyroid cancer Sister 58       type?; currently 20  . Other Sister        ovarian tumor @ 86; TAH/BSO  .  Thyroid cancer Brother        dx 57s; currently 62  . Breast cancer Maternal Aunt        dx 81s; deceased 60  . Thyroid cancer Paternal Aunt        All 3 paternal aunts with thyroid ca in 30s/40s  . Lung cancer Paternal Aunt        2 of 3 paternal aunts with lung cancer    ROS: Review of Systems  Constitutional: Negative for weight loss.  Musculoskeletal:       Bilateral lower extremity edema     PHYSICAL EXAM: Blood pressure 125/79, pulse 74, temperature 98 F (36.7 C), temperature source Oral, height 5\' 8"  (1.727 m), weight 267 lb (121.1 kg), SpO2 96 %. Body mass index is 40.6 kg/m. Physical Exam  Constitutional: She is oriented to person, place, and time. She appears well-developed and well-nourished.  Cardiovascular: Normal rate.   Pulmonary/Chest: Effort normal.  Musculoskeletal: She exhibits edema (bilateral lower extremity edema).  Neurological: She is oriented to person, place, and time.  Skin: Skin is warm and dry.  Psychiatric: She has a normal mood and affect. Her behavior is normal.  Vitals reviewed.   RECENT LABS AND TESTS: BMET    Component Value Date/Time   NA 142 09/20/2016 1045   NA 143 07/03/2016 0946   K 4.6 09/20/2016 1045   K 4.1 07/03/2016 0946   CL 100 09/20/2016 1045   CO2 29 09/20/2016 1045  CO2 29 07/03/2016 0946   GLUCOSE 93 09/20/2016 1045   GLUCOSE 115 07/03/2016 0946   BUN 12 09/20/2016 1045   BUN 8.8 07/03/2016 0946   CREATININE 0.92 09/20/2016 1045   CREATININE 0.9 07/03/2016 0946   CALCIUM 10.1 09/20/2016 1045   CALCIUM 9.8 07/03/2016 0946   GFRNONAA 67 09/20/2016 1045   GFRAA 77 09/20/2016 1045   Lab Results  Component Value Date   HGBA1C 5.6 09/20/2016   HGBA1C 5.7 (H) 04/10/2016   HGBA1C 5.9 (H) 10/25/2015   HGBA1C 5.7 (H) 05/10/2015   HGBA1C 5.8 (H) 12/17/2014   Lab Results  Component Value Date   INSULIN 21.8 09/20/2016   CBC    Component Value Date/Time   WBC 2.5 (L) 09/25/2016 1014   WBC 4.0 09/13/2015  1414   RBC 4.69 09/25/2016 1014   RBC 4.90 09/13/2015 1414   HGB 13.2 09/25/2016 1014   HCT 40.5 09/25/2016 1014   PLT 236 09/25/2016 1014   MCV 86.5 09/25/2016 1014   MCH 28.2 09/25/2016 1014   MCH 26.7 09/16/2014 1140   MCHC 32.6 09/25/2016 1014   MCHC 32.7 09/13/2015 1414   RDW 16.4 (H) 09/25/2016 1014   LYMPHSABS 0.9 09/25/2016 1014   MONOABS 0.4 09/25/2016 1014   EOSABS 0.0 09/25/2016 1014   EOSABS 0.0 09/20/2016 1045   BASOSABS 0.0 09/25/2016 1014   Iron/TIBC/Ferritin/ %Sat No results found for: IRON, TIBC, FERRITIN, IRONPCTSAT Lipid Panel     Component Value Date/Time   CHOL 177 09/20/2016 1045   TRIG 143 09/20/2016 1045   HDL 70 09/20/2016 1045   CHOLHDL 3 01/17/2016 1547   VLDL 33.0 01/17/2016 1547   LDLCALC 78 09/20/2016 1045   LDLDIRECT 102.0 07/08/2014 1107   Hepatic Function Panel     Component Value Date/Time   PROT 6.7 09/20/2016 1045   PROT 6.4 07/03/2016 0946   ALBUMIN 4.2 09/20/2016 1045   ALBUMIN 3.7 07/03/2016 0946   AST 30 09/20/2016 1045   AST 40 (H) 07/03/2016 0946   ALT 39 (H) 09/20/2016 1045   ALT 67 (H) 07/03/2016 0946   ALKPHOS 60 09/20/2016 1045   ALKPHOS 64 07/03/2016 0946   BILITOT 0.4 09/20/2016 1045   BILITOT 0.39 07/03/2016 0946   BILIDIR 0.1 08/11/2013 0918   IBILI 0.3 08/11/2013 0918      Component Value Date/Time   TSH 5.600 (H) 09/20/2016 1045   TSH 0.17 (L) 07/08/2014 1107   TSH 0.084 (L) 09/24/2013 1122    ASSESSMENT AND PLAN: Bilateral edema of lower extremity  Class 3 obesity with serious comorbidity and body mass index (BMI) of 40.0 to 44.9 in adult, unspecified obesity type (HCC)  PLAN:  Bilateral Lower Extremity Edema Michelle Kennedy encouraged to increase H2O intake and decrease sodium and simple carbohydrates. She agrees to follow up with our clinic in 2 weeks to re-address.  We spent > than 50% of the 15 minute visit on the counseling as documented in the note.  Obesity Michelle Kennedy is currently in the action stage  of change. As such, her goal is to continue with weight loss efforts She has agreed to follow a lower carbohydrate, vegetable and lean protein rich diet plan Michelle Kennedy has been instructed to work up to a goal of 150 minutes of combined cardio and strengthening exercise per week for weight loss and overall health benefits. We discussed the following Behavioral Modification Stratagies today: increasing lean protein intake, decreasing simple carbohydrates , emotional eating strategies and decrease junk food  Michelle Kennedy  has agreed to follow up with our clinic in 2 weeks. She was informed of the importance of frequent follow up visits to maximize her success with intensive lifestyle modifications for her multiple health conditions.  I, Doreene Nest, am acting as transcriptionist for Dennard Nip, MD  I have reviewed the above documentation for accuracy and completeness, and I agree with the above. -Dennard Nip, MD   OBESITY BEHAVIORAL INTERVENTION VISIT  Today's visit was # 5 out of 22.  Starting weight: 274 Starting date: 09/20/16 Today's weight : 267 Today's date: 12/04/2016 Total lbs lost to date: 7 (Patients must lose 7 lbs in the first 6 months to continue with counseling)   ASK: We discussed the diagnosis of obesity with Michelle Kennedy today and Michelle Kennedy agreed to give [place] permission to discuss obesity behavioral modification therapy today.  ASSESS: Michelle Kennedy has the diagnosis of obesity and her BMI today is @TBMI @ Michelle Kennedy is in the action stage of change   ADVISE: Michelle Kennedy was educated on the multiple health risks of obesity as well as the benefit of weight loss to improve her health. She was advised of the need for long term treatment and the importance of lifestyle modifications.  AGREE: Multiple dietary modification options and treatment options were discussed and  Michelle Kennedy agreed to follow the above plan

## 2016-12-11 MED FILL — LEVOTHYROXINE 150 MCG TAB: 150 | 30 days supply | Qty: 30 | Fill #2

## 2016-12-12 ENCOUNTER — Telehealth (INDEPENDENT_AMBULATORY_CARE_PROVIDER_SITE_OTHER): Payer: Self-pay | Admitting: Family Medicine

## 2016-12-12 ENCOUNTER — Encounter: Payer: Self-pay | Admitting: Internal Medicine

## 2016-12-12 ENCOUNTER — Other Ambulatory Visit (INDEPENDENT_AMBULATORY_CARE_PROVIDER_SITE_OTHER): Payer: Self-pay

## 2016-12-12 ENCOUNTER — Ambulatory Visit (INDEPENDENT_AMBULATORY_CARE_PROVIDER_SITE_OTHER): Payer: 59 | Admitting: Internal Medicine

## 2016-12-12 VITALS — BP 124/84 | HR 64 | Ht 67.0 in | Wt 272.0 lb

## 2016-12-12 DIAGNOSIS — E890 Postprocedural hypothyroidism: Secondary | ICD-10-CM

## 2016-12-12 DIAGNOSIS — C73 Malignant neoplasm of thyroid gland: Secondary | ICD-10-CM

## 2016-12-12 DIAGNOSIS — E559 Vitamin D deficiency, unspecified: Secondary | ICD-10-CM

## 2016-12-12 LAB — T3, FREE: T3, Free: 3.1 pg/mL (ref 2.3–4.2)

## 2016-12-12 LAB — TSH: TSH: 0.84 u[IU]/mL (ref 0.35–4.50)

## 2016-12-12 LAB — T4, FREE: FREE T4: 0.9 ng/dL (ref 0.60–1.60)

## 2016-12-12 MED ORDER — VITAMIN D (ERGOCALCIFEROL) 1.25 MG (50000 UNIT) PO CAPS: 50000.0000 [IU] | ORAL_CAPSULE | ORAL | 0 refills | Status: DC

## 2016-12-12 MED ORDER — LEVOTHYROXINE SODIUM 150 MCG PO TABS: 150.0000 ug | ORAL_TABLET | Freq: Every day | ORAL | 3 refills | Status: DC

## 2016-12-12 NOTE — Telephone Encounter (Signed)
Rx sent to [facility] at [address].

## 2016-12-12 NOTE — Progress Notes (Addendum)
Patient ID: Michelle Kennedy, female   DOB: April 19, 1956, 61 y.o.   MRN: [id number]    HPI  Michelle Kennedy is a 61 y.o.-year-old female, referred by her PCP, Debbrah Alar, NP, for management of h/o papillary thyroid cancer and postsurgical hypothyroidism. She saw Dr. Iran Planas before.  Pt. has been dx with ThyCA in 2002, and had total thyroidectomy then [facility]). No RAI tx 2/2 severe allergy.  Last tumor marker check (10/11/2015): Tg <0.1, ATA <1. Reportedly, no postop U/S's checked.  She is on Levothyroxine (prev. 175 mcg daily) >> recently decreased to 125 mcg daily, then to 150 mcg daily on 11/06/2016 by PCP.  She takes the thyroid hormone: - fasting - with water - separated by >30 min from b'fast  - + calcium at night - no iron - + PPIs occasionally in the evening - + multivitamins in am along with LT4!  I reviewed pt's thyroid tests: Lab Results  Component Value Date   TSH 5.600 (H) 09/20/2016   TSH 0.17 (L) 07/08/2014   TSH 0.084 (L) 09/24/2013   TSH 1.740 04/05/2011   TSH 0.075 (L) 09/21/2010   TSH 0.765 12/21/2009   FREET4 1.16 09/20/2016   Prev. Very suppressed TSH levels per review of Care Everywhere records.  Pt describes: - + weight gain - + fatigue - + hot flushes - + depression and anxiety - + constipation - no hair loss  She is seeing Dr. Dennard Nip in the Weight Loss clinic.  She swims 4x a week 35-40 min, but every day in the summer.  Pt denies feeling nodules in neck, hoarseness, dysphagia/odynophagia, SOB with lying down.  She has + FH of thyroid disorders in: siblings: goiter. + FH of thyroid cancer: P aunts and P uncle. No h/o radiation tx to head or neck. Had RxTx for BrCA. No recent use of iodine supplements.  Pt. also has a history of BrCA in 2016. She is on a clinical trial with Dr. Sonny Dandy.  ROS: Constitutional: + see HPI Eyes: + blurry vision, no xerophthalmia ENT: no sore throat, + see HPI, + tinnitus, +  hypoacusis Cardiovascular: no CP/+ SOB/+ palpitations/+ leg swelling Respiratory: +all: cough/SOB/wheezing Gastrointestinal: no N/V/D/+ C/+heartburn Musculoskeletal: no muscle/joint aches Skin: no rashes, + easy bruising Neurological: no tremors/numbness/tingling/dizziness, + HA Psychiatric: + both: depression/anxiety  Past Medical History:  Diagnosis Date  . Abnormally small mouth   . Achilles tendon disorder, right   . Anemia   . Anxiety   . Arthritis    hips and knees  . Asthma    daily inhaler, prn inhaler and neb.  . Asthma due to environmental allergies   . Breast cancer ([place]) 01/2014   left  . Cancer ([place])   . Chest pain   . COPD (chronic obstructive pulmonary disease) ([place])   . Dental bridge present    upper front and lower right  . Dental crowns present    x 3  . Depression   . Family history of anesthesia complication    twin brother aspirated and died on OR table, per pt.  . Fibromyalgia   . Gallbladder disease   . GERD (gastroesophageal reflux disease)   . H/O blood clots   . History of gastric ulcer    as a teenager  . History of seizure age 56   as a reaction to Penicillin - no seizures since  . History of stomach ulcers   . History of thyroid cancer  s/p thyroidectomy  . Hyperlipidemia   . Hypothyroidism   . Joint pain   . Left knee injury   . Liver disorder   . Lymphedema    left arm  . Menopause   . Migraines   . Multiple food allergies   . Obesity   . OSA (obstructive sleep apnea) 02/16/2016  . Palpitations   . Personal history of chemotherapy 2015  . Personal history of radiation therapy 2015   Left  . Prediabetes   . Rheumatoid arthritis ([place])   . SOB (shortness of breath)   . Swelling of both ankles   . Tinnitus   . UTI (lower urinary tract infection) 02/17/2014  . Vitamin D deficiency    Past Surgical History:  Procedure Laterality Date  . ABDOMINAL HYSTERECTOMY  2004   complete  . ACHILLES TENDON REPAIR Right   .  APPENDECTOMY  2004  . BREAST LUMPECTOMY WITH NEEDLE LOCALIZATION AND AXILLARY SENTINEL LYMPH NODE BX Left 02/23/2014   Procedure: BREAST LUMPECTOMY WITH NEEDLE LOCALIZATION AND AXILLARY SENTINEL LYMPH NODE BIOPSY;  Surgeon: Excell Seltzer, MD;  Location: [place];  Service: General;  Laterality: Left;  . BREAST SURGERY  2011   left breast biposy  . CHOLECYSTECTOMY  1990  . COLONOSCOPY W/ POLYPECTOMY  06/2009  . EYE SURGERY Right 2013   exc. warts from underneath eyelid  . INCONTINENCE SURGERY  2004  . KNEE ARTHROSCOPY Bilateral    x 6 each knee  . LIGAMENT REPAIR Right    thumb/wrist  . ORIF TOE FRACTURE Right    great toe  . PORTACATH PLACEMENT N/A 03/24/2014   Procedure: INSERTION PORT-A-CATH;  Surgeon: Excell Seltzer, MD;  Location: [place];  Service: General;  Laterality: N/A;  . THYROIDECTOMY  2000  . TONSILLECTOMY AND ADENOIDECTOMY  2000  . TUMOR EXCISION     from thoracic spine   Social History   Social History  . Marital status: Married    Spouse name: N/A  . Number of children: 2   Occupational History  . 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 Smoking status: Never Smoker  . Smokeless tobacco: Never Used  . Alcohol use No  . Drug use: No   Social History Narrative   Regular exercise: yes   Current Outpatient Prescriptions on File Prior to Visit  Medication Sig Dispense Refill  . albuterol (PROAIR HFA) 108 (90 Base) MCG/ACT inhaler INHALE 2 PUFFS INTO THE LUNGS EVERY 6 HOURS AS NEEDED 3 Inhaler 0  . albuterol (PROVENTIL) (5 MG/ML) 0.5% nebulizer solution Take 2.5 mg by nebulization every 6 (six) hours as needed for wheezing or shortness of breath.    . anastrozole (ARIMIDEX) 1 MG tablet Take 1 mg by mouth daily.    . Calcium Carbonate (CALCIUM 500 PO) Take 1 tablet by mouth daily.     . diphenhydrAMINE (BENADRYL) 25 MG tablet Take 1 tablet an hour before MRI.  Then take 1 tablet at bedtime as needed, per  your usual regimen. 30 tablet 0  . DULoxetine (CYMBALTA) 60 MG capsule TAKE 1 CAPSULE BY MOUTH  DAILY 90 capsule 1  . Ergocalciferol (VITAMIN D2) 400 UNITS TABS Take 1 tablet by mouth daily.    . Fluticasone-Salmeterol (ADVAIR DISKUS) 250-50 MCG/DOSE AEPB Inhale 1 puff into the lungs 2 (two) times daily. 180 each 1  . Investigational palbociclib (IBRANCE) 125 MG capsule [facility] AFT-05 [name] Take 1 capsule (125 mg total) by mouth  daily. Take with food. Swallow whole. Do not chew. Take on days 1-21. Repeat every 28 days. 69 capsule 0  . meloxicam (MOBIC) 7.5 MG tablet TAKE 1 TABLET BY MOUTH  DAILY 90 tablet 1  . mirabegron ER (MYRBETRIQ) 50 MG TB24 tablet Take 1 tablet (50 mg total) by mouth daily. 90 tablet 1  . Multiple Vitamin (MULTIVITAMIN) tablet Take 1 tablet by mouth daily.      . Omega-3 Fatty Acids (FISH OIL) 1200 MG CAPS Take 1 capsule by mouth daily.      . simvastatin (ZOCOR) 40 MG tablet TAKE 1 TABLET BY MOUTH  DAILY 90 tablet 1  . traMADol (ULTRAM) 50 MG tablet Take 50 mg by mouth every 6 (six) hours as needed.    . Vitamin D, Ergocalciferol, (DRISDOL) 50000 units CAPS capsule Take 1 capsule (50,000 Units total) by mouth every 7 (seven) days. 4 capsule 0  . EPINEPHrine 0.3 mg/0.3 mL IJ SOAJ injection Inject 0.3 mLs (0.3 mg total) into the muscle once. (Patient not taking: Reported on 12/12/2016) 2 Device 0  . nitroGLYCERIN (NITROSTAT) 0.4 MG SL tablet Place 1 tablet (0.4 mg total) under the tongue every 5 (five) minutes as needed for chest pain. (Patient not taking: Reported on 12/12/2016) 30 tablet 0   No current facility-administered medications on file prior to visit.    Allergies  Allergen Reactions  . Bee Venom Anaphylaxis  . Contrast Media [Iodinated Diagnostic Agents] Shortness Of Breath  . Iodine Other (See Comments)    CARDIAC ARREST  . Latex Anaphylaxis and Rash  . Penicillins Shortness Of Breath, Rash and Other (See Comments)    SEIZURE  . Shellfish Allergy  Shortness Of Breath  . Aspirin Rash and Other (See Comments)    GI UPSET  . Erythromycin Swelling and Rash    SWELLING OF JOINTS  . Lidocaine Swelling    SWELLING OF MOUTH AND THROAT  . Symbicort [Budesonide-Formoterol Fumarate] Other (See Comments)    BURNING OF TONGUE AND LIPS  . Codeine Rash  . Compazine [Prochlorperazine Maleate] Rash    Rash on face,chest, arms, back  . Pentazocine Lactate Rash  . Sulfonamide Derivatives Rash   Family History  Problem Relation Age of Onset  . Alcohol abuse Mother   . Arthritis Mother   . Hypertension Mother   . Bipolar disorder Mother   . Breast cancer Mother 53       unconfirmed  . Lung cancer Mother 47       smoker  . Hyperlipidemia Mother   . Stroke Mother   . Cancer Mother   . Depression Mother   . Anxiety disorder Mother   . Alcoholism Mother   . Drug abuse Mother   . Eating disorder Mother   . Obesity Mother   . Alcohol abuse Father   . Cancer Father        lung  . Hyperlipidemia Father   . Kidney disease Father   . Diabetes Father   . Hypertension Father   . Cystic kidney disease Father   . Thyroid disease Father   . Liver disease Father   . Alcoholism Father   . Arthritis Maternal Grandmother   . Diabetes Paternal Grandmother   . Thyroid cancer Sister 25       type?; currently 61  . Other Sister        ovarian tumor @ 48; TAH/BSO  . Thyroid cancer Brother        dx 68s; currently  29  . Breast cancer Maternal Aunt        dx 61s; deceased 29  . Thyroid cancer Paternal Aunt        All 3 paternal aunts with thyroid ca in 30s/40s  . Lung cancer Paternal Aunt        2 of 3 paternal aunts with lung cancer    PE: BP 124/84 (BP Location: Left Arm, Patient Position: Sitting)   Pulse 64   Ht 5' 7"  (1.702 m)   Wt 272 lb (123.4 kg)   BMI 42.60 kg/m  Wt Readings from Last 3 Encounters:  12/12/16 272 lb (123.4 kg)  12/04/16 267 lb (121.1 kg)  11/15/16 262 lb (118.8 kg)   Constitutional: obese, in NAD Eyes:  PERRLA, EOMI, no exophthalmos ENT: moist mucous membranes, well healed thyroidectomy scar, no cervical lymphadenopathy Cardiovascular: RRR, No MRG Respiratory: CTA B Gastrointestinal: abdomen soft, NT, ND, BS+ Musculoskeletal: no deformities, strength intact in all 4 Skin: moist, warm, no rashes Neurological: no tremor with outstretched hands, DTR normal in all 4  ASSESSMENT: 1. H/O PTC  2. Hypothyroidism  PLAN:  1. PTC - I do not have records of the initial pathology but pt tells me she may have them at home >> will look and let me know - no RAI tx b/c allergy to Iodine - latest Tg undetectable, as are ATA Abs per records reviewed when she was seeing Dr Tamala Julian - will recheck Tg, ATA and also check a neck U/S  2. Patient with long-standing postsurgical hypothyroidism, on levothyroxine therapy. Last TSH has been higher, but prev. All TSH levels suppressed. We discussed that we now target the low normal range rather than <LLN for ThyCA, especially beyond the first year postop. - she c/o fatigue and weight gain after decreasing the dose of LT4 from 175 to 125 mcg daily. Now on 150 mcg daily x 5 weeks. - she does not appear to have a goiter, thyroid nodules, or neck compression symptoms - We discussed about correct intake of levothyroxine, fasting, with water, separated by at least 30 minutes from breakfast, and separated by more than 4 hours from calcium, iron, multivitamins, acid reflux medications (PPIs). She takes MVI with the LT4 >> will move MVI to lunch or later - will check thyroid tests today: TSH, free T4, free T3 - If labs today are abnormal, she will need to return in ~6 weeks for repeat labs - Otherwise, I will see her back in 3-4 months  Needs refills - urgent!  Office Visit on 12/12/2016  Component Date Value Ref Range Status  . TSH 12/12/2016 0.84  0.35 - 4.50 uIU/mL Final  . Free T4 12/12/2016 0.90  0.60 - 1.60 ng/dL Final   Comment: Specimens from patients who are  undergoing biotin therapy and /or ingesting biotin supplements may contain high levels of biotin.  The higher biotin concentration in these specimens interferes with this Free T4 assay.  Specimens that contain high levels  of biotin may cause false high results for this Free T4 assay.  Please interpret results in light of the total clinical presentation of the patient.    . T3, Free 12/12/2016 3.1  2.3 - 4.2 pg/mL Final   TFTs normal >> will continue LT4 150 mcg daily.  Component     Latest Ref Rng & Units 12/12/2016  Thyroglobulin     ng/mL 0.1 (L)  Thyroglobulin Ab     <2 IU/mL <1   Tg detectable, but very  low.   US SOFT TISSUE HEAD AND NECK  Order: [id number]  Status:  Final result Visible to patient:  No (Not Released) Dx:  Malignant neoplasm of thyroid gland (...  Details   Reading Physician Reading Date Result Priority  Greggory Keen, MD 01/10/2017   Narrative    CLINICAL DATA: Previous thyroidectomy for papillary thyroid cancer  EXAM: THYROID ULTRASOUND  TECHNIQUE: Ultrasound examination of the thyroid gland and adjacent soft tissues was performed.  COMPARISON: 03/19/2014 PET-CT, 11/03/2010 ultrasound report  FINDINGS: Complete thyroidectomy. No residual soft tissue abnormality thyroid read on either side  _________________________________________________________  Estimated total number of nodules >/= 1 cm: 0  Number of spongiform nodules >/= 2 cm not described below (TR1): 0  Number of mixed cystic and solid nodules >/= 1.5 cm not described below ([place]): 0  _________________________________________________________  No adenopathy.  IMPRESSION: Complete thyroidectomy without significant abnormality by ultrasound.  The above is in keeping with the ACR TI-RADS recommendations - J Am Coll Radiol 2017;14:587-595.   Electronically Signed By: Jerilynn Mages. Shick M.D. On: 01/10/2017 16:23       No apparent recurrence or neck metastases.  Philemon Kingdom, MD PhD [facility] Endocrinology

## 2016-12-12 NOTE — Telephone Encounter (Signed)
Vitamin D, Ergocalciferol, (DRISDOL) 50000 units CAPS capsule  Needs to be sent to her mail order , she says it is on file .  If issues call pt at [phone].

## 2016-12-12 NOTE — Telephone Encounter (Signed)
Lm for pt on identified voice mail,  we can only refill for 30days at a time and we do like to send in at time of office visit.  Please let us know either by my chart msg or call back if she needs a refill to local pharm and we will discuss with Dr Leafy Ro.   Laquiesha Piacente R CMA

## 2016-12-12 NOTE — Telephone Encounter (Signed)
Pt would like 30 d to [facility] on file.

## 2016-12-12 NOTE — Patient Instructions (Addendum)
Please stop at the lab.  Please contnue the Levothyroxine at 125 mcg daily.  Take the thyroid hormone every day, with water, at least 30 minutes before breakfast, separated by at least 4 hours from: - acid reflux medications - calcium - iron - multivitamins  Move the Multivitamins at least 4h after Levothyroxine.  You will be called with the U/S schedule.  Please come back in 3-4 months.

## 2016-12-13 DIAGNOSIS — H43813 Vitreous degeneration, bilateral: Secondary | ICD-10-CM | POA: Diagnosis not present

## 2016-12-13 DIAGNOSIS — H04123 Dry eye syndrome of bilateral lacrimal glands: Secondary | ICD-10-CM | POA: Diagnosis not present

## 2016-12-13 DIAGNOSIS — H5213 Myopia, bilateral: Secondary | ICD-10-CM | POA: Diagnosis not present

## 2016-12-13 LAB — THYROGLOBULIN ANTIBODY

## 2016-12-13 LAB — THYROGLOBULIN LEVEL: Thyroglobulin: 0.1 ng/mL — ABNORMAL LOW

## 2016-12-17 ENCOUNTER — Other Ambulatory Visit: Payer: Self-pay | Admitting: Family

## 2016-12-18 DIAGNOSIS — G5602 Carpal tunnel syndrome, left upper limb: Secondary | ICD-10-CM | POA: Diagnosis not present

## 2016-12-18 DIAGNOSIS — G5601 Carpal tunnel syndrome, right upper limb: Secondary | ICD-10-CM | POA: Diagnosis not present

## 2016-12-19 ENCOUNTER — Ambulatory Visit (INDEPENDENT_AMBULATORY_CARE_PROVIDER_SITE_OTHER): Payer: 59 | Admitting: Family Medicine

## 2016-12-19 VITALS — BP 130/82 | HR 72 | Temp 97.9°F | Ht 67.0 in | Wt 266.0 lb

## 2016-12-19 DIAGNOSIS — F3289 Other specified depressive episodes: Secondary | ICD-10-CM

## 2016-12-19 DIAGNOSIS — Z9189 Other specified personal risk factors, not elsewhere classified: Secondary | ICD-10-CM | POA: Diagnosis not present

## 2016-12-19 DIAGNOSIS — E669 Obesity, unspecified: Secondary | ICD-10-CM | POA: Diagnosis not present

## 2016-12-19 DIAGNOSIS — Z6841 Body Mass Index (BMI) 40.0 and over, adult: Secondary | ICD-10-CM | POA: Diagnosis not present

## 2016-12-19 DIAGNOSIS — IMO0001 Reserved for inherently not codable concepts without codable children: Secondary | ICD-10-CM

## 2016-12-19 DIAGNOSIS — E782 Mixed hyperlipidemia: Secondary | ICD-10-CM | POA: Diagnosis not present

## 2016-12-19 MED ORDER — TOPIRAMATE 25 MG PO TABS: 25.0000 mg | ORAL_TABLET | Freq: Every day | ORAL | 0 refills | Status: DC

## 2016-12-19 NOTE — Progress Notes (Signed)
Office: [phone]  /  Fax: [phone]   HPI:   Chief Complaint: OBESITY Michelle Kennedy is here to discuss her progress with her obesity treatment plan. She is on the lower carbohydrate, vegetable and lean protein rich diet plan and is following her eating plan approximately 75 % of the time. She states she is swimming for 35 to 40 minutes 4 times per week. Michelle Kennedy struggled to follow the low carbohydrate plan byut has been struggling with cravings and boredom/emotional eating. She would like to discuss emotional eating strategies. Her weight is 266 lb (120.7 kg) today and has had a weight loss of 1 pound over a period of 2 weeks since her last visit. She has lost 8 lbs since starting treatment with [place].  Hyperlipidemia Michelle Kennedy has hyperlipidemia and has been trying to improve her cholesterol levels with intensive lifestyle modification including a low saturated fat diet, exercise and weight loss. She denies any chest pain, claudication or myalgias.  Depression with emotional eating behaviors Michelle Kennedy is on Cymbalta for fibromyalgia but is still struggling with significant emotional eating that is worse later in the day. She is not binge eating but she is snacking to an excessive level. Michelle Kennedy struggles with emotional eating and using food for comfort to the extent that it is negatively impacting her health. She often snacks when she is not hungry. Michelle Kennedy sometimes feels she is out of control and then feels guilty that she made poor food choices. She has been working on behavior modification techniques to help reduce her emotional eating and has been somewhat successful. She shows no sign of suicidal or homicidal ideations.  At risk for cardiovascular disease Michelle Kennedy is at a higher than average risk for cardiovascular disease due to obesity. She currently denies any chest pain.  Depression screen [facility] 2/9 09/20/2016 11/26/2015 10/22/2014 08/26/2014  Decreased Interest 3 0 0 0  Down, Depressed, Hopeless  1 0 0 0  PHQ - 2 Score 4 0 0 0  Altered sleeping 2 - - -  Tired, decreased energy 3 - - -  Change in appetite 1 - - -  Feeling bad or failure about yourself  0 - - -  Trouble concentrating 1 - - -  Moving slowly or fidgety/restless 0 - - -  Suicidal thoughts 0 - - -  PHQ-9 Score 11 - - -  Some recent data might be hidden   At risk for cardiovascular disease Michelle Kennedy is at a higher than average risk for cardiovascular disease due to obesity. She currently denies any chest pain.   ALLERGIES: Allergies  Allergen Reactions   Bee Venom Anaphylaxis   Contrast Media [Iodinated Diagnostic Agents] Shortness Of Breath   Iodine Other (See Comments)    CARDIAC ARREST   Latex Anaphylaxis and Rash   Penicillins Shortness Of Breath, Rash and Other (See Comments)    SEIZURE   Shellfish Allergy Shortness Of Breath   Aspirin Rash and Other (See Comments)    GI UPSET   Erythromycin Swelling and Rash    SWELLING OF JOINTS   Lidocaine Swelling    SWELLING OF MOUTH AND THROAT   Symbicort [Budesonide-Formoterol Fumarate] Other (See Comments)    BURNING OF TONGUE AND LIPS   Codeine Rash   Compazine [Prochlorperazine Maleate] Rash    Rash on face,chest, arms, back   Pentazocine Lactate Rash   Sulfonamide Derivatives Rash    MEDICATIONS: Current Outpatient Prescriptions on File Prior to Visit  Medication Sig Dispense Refill   albuterol (  PROAIR HFA) 108 (90 Base) MCG/ACT inhaler INHALE 2 PUFFS INTO THE LUNGS EVERY 6 HOURS AS NEEDED 3 Inhaler 0   albuterol (PROVENTIL) (5 MG/ML) 0.5% nebulizer solution Take 2.5 mg by nebulization every 6 (six) hours as needed for wheezing or shortness of breath.     anastrozole (ARIMIDEX) 1 MG tablet Take 1 mg by mouth daily.     Calcium Carbonate (CALCIUM 500 PO) Take 1 tablet by mouth daily.      diphenhydrAMINE (BENADRYL) 25 MG tablet Take 1 tablet an hour before MRI.  Then take 1 tablet at bedtime as needed, per your usual regimen. 30 tablet  0   DULoxetine (CYMBALTA) 60 MG capsule TAKE 1 CAPSULE BY MOUTH  DAILY 90 capsule 1   EPINEPHrine 0.3 mg/0.3 mL IJ SOAJ injection Inject 0.3 mLs (0.3 mg total) into the muscle once. 2 Device 0   Ergocalciferol (VITAMIN D2) 400 UNITS TABS Take 1 tablet by mouth daily.     Fluticasone-Salmeterol (ADVAIR DISKUS) 250-50 MCG/DOSE AEPB Inhale 1 puff into the lungs 2 (two) times daily. 180 each 1   Investigational palbociclib (IBRANCE) 125 MG capsule [facility] AFT-05 [name] Take 1 capsule (125 mg total) by mouth daily. Take with food. Swallow whole. Do not chew. Take on days 1-21. Repeat every 28 days. 69 capsule 0   levothyroxine (SYNTHROID, LEVOTHROID) 150 MCG tablet Take 1 tablet (150 mcg total) by mouth daily. 90 tablet 3   meloxicam (MOBIC) 7.5 MG tablet TAKE 1 TABLET BY MOUTH  DAILY 90 tablet 1   mirabegron ER (MYRBETRIQ) 50 MG TB24 tablet Take 1 tablet (50 mg total) by mouth daily. 90 tablet 1   Multiple Vitamin (MULTIVITAMIN) tablet Take 1 tablet by mouth daily.       nitroGLYCERIN (NITROSTAT) 0.4 MG SL tablet Place 1 tablet (0.4 mg total) under the tongue every 5 (five) minutes as needed for chest pain. 30 tablet 0   Omega-3 Fatty Acids (FISH OIL) 1200 MG CAPS Take 1 capsule by mouth daily.       simvastatin (ZOCOR) 40 MG tablet TAKE 1 TABLET BY MOUTH  DAILY 90 tablet 1   traMADol (ULTRAM) 50 MG tablet Take 50 mg by mouth every 6 (six) hours as needed.     Vitamin D, Ergocalciferol, (DRISDOL) 50000 units CAPS capsule Take 1 capsule (50,000 Units total) by mouth every 7 (seven) days. 4 capsule 0   No current facility-administered medications on file prior to visit.     PAST MEDICAL HISTORY: Past Medical History:  Diagnosis Date   Abnormally small mouth    Achilles tendon disorder, right    Anemia    Anxiety    Arthritis    hips and knees   Asthma    daily inhaler, prn inhaler and neb.   Asthma due to environmental allergies    Breast cancer ([place]) 01/2014    left   Cancer [facility])    Chest pain    COPD (chronic obstructive pulmonary disease) (HCC)    Dental bridge present    upper front and lower right   Dental crowns present    x 3   Depression    Family history of anesthesia complication    twin brother aspirated and died on OR table, per pt.   Fibromyalgia    Gallbladder disease    GERD (gastroesophageal reflux disease)    H/O blood clots    History of gastric ulcer    as a teenager   History of  seizure age 37   as a reaction to Penicillin - no seizures since   History of stomach ulcers    History of thyroid cancer    s/p thyroidectomy   Hyperlipidemia    Hypothyroidism    Joint pain    Left knee injury    Liver disorder    Lymphedema    left arm   Menopause    Migraines    Multiple food allergies    Obesity    OSA (obstructive sleep apnea) 02/16/2016   Palpitations    Personal history of chemotherapy 2015   Personal history of radiation therapy 2015   Left   Prediabetes    Rheumatoid arthritis (HCC)    SOB (shortness of breath)    Swelling of both ankles    Tinnitus    UTI (lower urinary tract infection) 02/17/2014   Vitamin D deficiency     PAST SURGICAL HISTORY: Past Surgical History:  Procedure Laterality Date   ABDOMINAL HYSTERECTOMY  2004   complete   ACHILLES TENDON REPAIR Right    APPENDECTOMY  2004   BREAST LUMPECTOMY WITH NEEDLE LOCALIZATION AND AXILLARY SENTINEL LYMPH NODE BX Left 02/23/2014   Procedure: BREAST LUMPECTOMY WITH NEEDLE LOCALIZATION AND AXILLARY SENTINEL LYMPH NODE BIOPSY;  Surgeon: Excell Seltzer, MD;  Location: [place];  Service: General;  Laterality: Left;   BREAST SURGERY  2011   left breast biposy   CHOLECYSTECTOMY  1990   COLONOSCOPY W/ POLYPECTOMY  06/2009   EYE SURGERY Right 2013   exc. warts from underneath eyelid   INCONTINENCE SURGERY  2004   KNEE ARTHROSCOPY Bilateral    x 6 each knee   LIGAMENT REPAIR  Right    thumb/wrist   ORIF TOE FRACTURE Right    great toe   PORTACATH PLACEMENT N/A 03/24/2014   Procedure: INSERTION PORT-A-CATH;  Surgeon: Excell Seltzer, MD;  Location: [place];  Service: General;  Laterality: N/A;   THYROIDECTOMY  2000   TONSILLECTOMY AND ADENOIDECTOMY  2000   TUMOR EXCISION     from thoracic spine    SOCIAL HISTORY: Social History  Substance Use Topics   Smoking status: Never Smoker   Smokeless tobacco: Never Used   Alcohol use No    FAMILY HISTORY: Family History  Problem Relation Age of Onset   Alcohol abuse Mother    Arthritis Mother    Hypertension Mother    Bipolar disorder Mother    Breast cancer Mother 16       unconfirmed   Lung cancer Mother 60       smoker   Hyperlipidemia Mother    Stroke Mother    Cancer Mother    Depression Mother    Anxiety disorder Mother    Alcoholism Mother    Drug abuse Mother    Eating disorder Mother    Obesity Mother    Alcohol abuse Father    Cancer Father        lung   Hyperlipidemia Father    Kidney disease Father    Diabetes Father    Hypertension Father    Cystic kidney disease Father    Thyroid disease Father    Liver disease Father    Alcoholism Father    Arthritis Maternal Grandmother    Diabetes Paternal Grandmother    Thyroid cancer Sister 61       type?; currently 54   Other Sister        ovarian tumor @  23; TAH/BSO   Thyroid cancer Brother        dx 78s; currently 46   Breast cancer Maternal Aunt        dx 16s; deceased 60   Thyroid cancer Paternal 2        All 3 paternal aunts with thyroid ca in 30s/40s   Lung cancer Paternal Aunt        2 of 3 paternal aunts with lung cancer    ROS: Review of Systems  Constitutional: Positive for weight loss.  Cardiovascular: Negative for chest pain.  Psychiatric/Behavioral: Positive for depression. Negative for suicidal ideas.    PHYSICAL EXAM: Blood pressure 130/82,  pulse 72, temperature 97.9 F (36.6 C), temperature source Oral, height 5\' 7"  (1.702 m), weight 266 lb (120.7 kg), SpO2 97 %. Body mass index is 41.66 kg/m. Physical Exam  Constitutional: She is oriented to person, place, and time. She appears well-developed and well-nourished.  Cardiovascular: Normal rate.   Pulmonary/Chest: Effort normal.  Musculoskeletal: Normal range of motion.  Neurological: She is oriented to person, place, and time.  Skin: Skin is warm and dry.  Vitals reviewed.   RECENT LABS AND TESTS: BMET    Component Value Date/Time   NA 142 09/20/2016 1045   NA 143 07/03/2016 0946   K 4.6 09/20/2016 1045   K 4.1 07/03/2016 0946   CL 100 09/20/2016 1045   CO2 29 09/20/2016 1045   CO2 29 07/03/2016 0946   GLUCOSE 93 09/20/2016 1045   GLUCOSE 115 07/03/2016 0946   BUN 12 09/20/2016 1045   BUN 8.8 07/03/2016 0946   CREATININE 0.92 09/20/2016 1045   CREATININE 0.9 07/03/2016 0946   CALCIUM 10.1 09/20/2016 1045   CALCIUM 9.8 07/03/2016 0946   GFRNONAA 67 09/20/2016 1045   GFRAA 77 09/20/2016 1045   Lab Results  Component Value Date   HGBA1C 5.6 09/20/2016   HGBA1C 5.7 (H) 04/10/2016   HGBA1C 5.9 (H) 10/25/2015   HGBA1C 5.7 (H) 05/10/2015   HGBA1C 5.8 (H) 12/17/2014   Lab Results  Component Value Date   INSULIN 21.8 09/20/2016   CBC    Component Value Date/Time   WBC 2.5 (L) 09/25/2016 1014   WBC 4.0 09/13/2015 1414   RBC 4.69 09/25/2016 1014   RBC 4.90 09/13/2015 1414   HGB 13.2 09/25/2016 1014   HCT 40.5 09/25/2016 1014   PLT 236 09/25/2016 1014   MCV 86.5 09/25/2016 1014   MCH 28.2 09/25/2016 1014   MCH 26.7 09/16/2014 1140   MCHC 32.6 09/25/2016 1014   MCHC 32.7 09/13/2015 1414   RDW 16.4 (H) 09/25/2016 1014   LYMPHSABS 0.9 09/25/2016 1014   MONOABS 0.4 09/25/2016 1014   EOSABS 0.0 09/25/2016 1014   EOSABS 0.0 09/20/2016 1045   BASOSABS 0.0 09/25/2016 1014   Iron/TIBC/Ferritin/ %Sat No results found for: IRON, TIBC, FERRITIN,  IRONPCTSAT Lipid Panel     Component Value Date/Time   CHOL 177 09/20/2016 1045   TRIG 143 09/20/2016 1045   HDL 70 09/20/2016 1045   CHOLHDL 3 01/17/2016 1547   VLDL 33.0 01/17/2016 1547   LDLCALC 78 09/20/2016 1045   LDLDIRECT 102.0 07/08/2014 1107   Hepatic Function Panel     Component Value Date/Time   PROT 6.7 09/20/2016 1045   PROT 6.4 07/03/2016 0946   ALBUMIN 4.2 09/20/2016 1045   ALBUMIN 3.7 07/03/2016 0946   AST 30 09/20/2016 1045   AST 40 (H) 07/03/2016 0946   ALT 39 (H) 09/20/2016 1045  ALT 67 (H) 07/03/2016 0946   ALKPHOS 60 09/20/2016 1045   ALKPHOS 64 07/03/2016 0946   BILITOT 0.4 09/20/2016 1045   BILITOT 0.39 07/03/2016 0946   BILIDIR 0.1 08/11/2013 0918   IBILI 0.3 08/11/2013 0918      Component Value Date/Time   TSH 0.84 12/12/2016 0909   TSH 5.600 (H) 09/20/2016 1045   TSH 0.17 (L) 07/08/2014 1107    ASSESSMENT AND PLAN: Other depression - Plan: topiramate (Michelle Kennedy) 25 MG tablet  At risk for heart disease  Class 3 obesity with serious comorbidity and body mass index (BMI) of 40.0 to 44.9 in adult, unspecified obesity type (Michelle Kennedy)  PLAN:  Hyperlipidemia Michelle Kennedy was informed of the American Heart Association Guidelines emphasizing intensive lifestyle modifications as the first line treatment for hyperlipidemia. We discussed many lifestyle modifications today in depth, and Michelle Kennedy will continue to work on decreasing saturated fats such as fatty red meat, butter and many fried foods. She will also increase vegetables and lean protein in her diet and continue to work on exercise and weight loss efforts.  Depression with Emotional Eating Behaviors We discussed behavior modification techniques today to help Michelle Kennedy deal with her emotional eating and depression. She has agreed to start to take Topiramate 25 mg every night #30 with no refills and will follow up as directed.  Cardiovascular risk counselling [place] was given extended (15 minutes) coronary  artery disease prevention counseling today. She is 61 y.o. female and has risk factors for heart disease including obesity. We discussed intensive lifestyle modifications today with an emphasis on specific weight loss instructions and strategies. Pt was also informed of the importance of increasing exercise and decreasing saturated fats to help prevent heart disease.  Obesity Michelle Kennedy is currently in the action stage of change. As such, her goal is to continue with weight loss efforts She has agreed to follow a lower carbohydrate, vegetable and lean protein rich diet plan Michelle Kennedy has been instructed to work up to a goal of 150 minutes of combined cardio and strengthening exercise per week for weight loss and overall health benefits. We discussed the following Behavioral Modification Strategies today: planning for success, increasing lean protein intake and emotional eating strategies  Michelle Kennedy has agreed to follow up with our clinic in 2 weeks. She was informed of the importance of frequent follow up visits to maximize her success with intensive lifestyle modifications for her multiple health conditions.  I, Michelle Kennedy, am acting as transcriptionist for Michelle Nip, MD  I have reviewed the above documentation for accuracy and completeness, and I agree with the above. -Michelle Nip, MD  OBESITY BEHAVIORAL INTERVENTION VISIT  Today's visit was # 6 out of 22.  Starting weight: 274 lbs Starting date: 09/20/16 Today's weight : 266 lbs Today's date: 12/19/2016 Total lbs lost to date: 8 (Patients must lose 7 lbs in the first 6 months to continue with counseling)   ASK: We discussed the diagnosis of obesity with Michelle Kennedy today and Michelle Kennedy agreed to give [place] permission to discuss obesity behavioral modification therapy today.  ASSESS: Michelle Kennedy has the diagnosis of obesity and her BMI today is 41.7 Michelle Kennedy is in the action stage of change   ADVISE: Michelle Kennedy was educated on the multiple  health risks of obesity as well as the benefit of weight loss to improve her health. She was advised of the need for long term treatment and the importance of lifestyle modifications.  AGREE: Multiple dietary modification options and treatment options were  discussed and  Michelle Kennedy agreed to follow a lower carbohydrate, vegetable and lean protein rich diet plan We discussed the following Behavioral Modification Strategies today: planning for success, increasing lean protein intake and emotional eating strategies

## 2016-12-24 ENCOUNTER — Other Ambulatory Visit: Payer: Self-pay | Admitting: Hematology and Oncology

## 2017-01-01 ENCOUNTER — Ambulatory Visit (INDEPENDENT_AMBULATORY_CARE_PROVIDER_SITE_OTHER): Payer: 59 | Admitting: Family Medicine

## 2017-01-01 VITALS — BP 125/79 | HR 72 | Temp 98.1°F | Ht 67.0 in | Wt 263.0 lb

## 2017-01-01 DIAGNOSIS — F3289 Other specified depressive episodes: Secondary | ICD-10-CM

## 2017-01-01 DIAGNOSIS — E669 Obesity, unspecified: Secondary | ICD-10-CM | POA: Diagnosis not present

## 2017-01-01 DIAGNOSIS — Z6841 Body Mass Index (BMI) 40.0 and over, adult: Secondary | ICD-10-CM | POA: Diagnosis not present

## 2017-01-01 DIAGNOSIS — Z9189 Other specified personal risk factors, not elsewhere classified: Secondary | ICD-10-CM

## 2017-01-01 DIAGNOSIS — G5601 Carpal tunnel syndrome, right upper limb: Secondary | ICD-10-CM | POA: Diagnosis not present

## 2017-01-01 DIAGNOSIS — E559 Vitamin D deficiency, unspecified: Secondary | ICD-10-CM

## 2017-01-01 DIAGNOSIS — IMO0001 Reserved for inherently not codable concepts without codable children: Secondary | ICD-10-CM

## 2017-01-01 DIAGNOSIS — G5602 Carpal tunnel syndrome, left upper limb: Secondary | ICD-10-CM | POA: Diagnosis not present

## 2017-01-01 MED ORDER — TOPIRAMATE 50 MG PO TABS: 50.0000 mg | ORAL_TABLET | Freq: Every day | ORAL | 0 refills | Status: DC

## 2017-01-01 MED ORDER — VITAMIN D (ERGOCALCIFEROL) 1.25 MG (50000 UNIT) PO CAPS
50000.0000 [IU] | ORAL_CAPSULE | ORAL | 0 refills | Status: DC
Start: 2017-01-01 — End: 2017-09-10

## 2017-01-01 NOTE — Progress Notes (Signed)
Office: [phone]  /  Fax: [phone]   HPI:   Chief Complaint: OBESITY Michelle Kennedy is here to discuss her progress with her obesity treatment plan. She is on the  follow a lower carbohydrate, vegetable and lean protein rich diet plan and is following her eating plan approximately 80 % of the time. She states she is swimming 40 minutes 5 times per week. Michelle Kennedy has done well with weight loss and is journaling and working on increasing her protein. Her weight is 263 lb (119.3 kg) today and has had a weight loss of 3 pounds over a period of 2 weeks since her last visit. She has lost 11 lbs since starting treatment with [place].  Vitamin D deficiency Michelle Kennedy has a diagnosis of vitamin D deficiency. She is currently stable on vit D, not yet at goal and denies nausea, vomiting or muscle weakness.  Depression with emotional eating behaviors Michelle Kennedy started topamax and notes no change in emotional eating. She struggles with emotional eating and using food for comfort to the extent that it is negatively impacting her health. She often snacks when she is not hungry. Michelle Kennedy sometimes feels she is out of control and then feels guilty that she made poor food choices. She has been working on behavior modification techniques to help reduce her emotional eating and has been somewhat successful. She shows no sign of suicidal or homicidal ideations.  At risk for cardiovascular disease Michelle Kennedy is at a higher than average risk for cardiovascular disease due to obesity. She currently denies any chest pain.  Depression screen Michelle Kennedy 2/9 09/20/2016 11/26/2015 10/22/2014 08/26/2014  Decreased Interest 3 0 0 0  Down, Depressed, Hopeless 1 0 0 0  PHQ - 2 Score 4 0 0 0  Altered sleeping 2 - - -  Tired, decreased energy 3 - - -  Change in appetite 1 - - -  Feeling bad or failure about yourself  0 - - -  Trouble concentrating 1 - - -  Moving slowly or fidgety/restless 0 - - -  Suicidal thoughts 0 - - -  PHQ-9 Score 11 - - -   Some recent data might be hidden     ALLERGIES: Allergies  Allergen Reactions  . Bee Venom Anaphylaxis  . Contrast Media [Iodinated Diagnostic Agents] Shortness Of Breath  . Iodine Other (See Comments)    CARDIAC ARREST  . Latex Anaphylaxis and Rash  . Penicillins Shortness Of Breath, Rash and Other (See Comments)    SEIZURE  . Shellfish Allergy Shortness Of Breath  . Aspirin Rash and Other (See Comments)    GI UPSET  . Erythromycin Swelling and Rash    SWELLING OF JOINTS  . Lidocaine Swelling    SWELLING OF MOUTH AND THROAT  . Symbicort [Budesonide-Formoterol Fumarate] Other (See Comments)    BURNING OF TONGUE AND LIPS  . Codeine Rash  . Compazine [Prochlorperazine Maleate] Rash    Rash on face,chest, arms, back  . Pentazocine Lactate Rash  . Sulfonamide Derivatives Rash    MEDICATIONS: Current Outpatient Prescriptions on File Prior to Visit  Medication Sig Dispense Refill  . albuterol (PROAIR HFA) 108 (90 Base) MCG/ACT inhaler INHALE 2 PUFFS INTO THE LUNGS EVERY 6 HOURS AS NEEDED 3 Inhaler 0  . albuterol (PROVENTIL) (5 MG/ML) 0.5% nebulizer solution Take 2.5 mg by nebulization every 6 (six) hours as needed for wheezing or shortness of breath.    . anastrozole (ARIMIDEX) 1 MG tablet TAKE 1 TABLET BY MOUTH  DAILY 90  tablet 3  . Calcium Carbonate (CALCIUM 500 PO) Take 1 tablet by mouth daily.     . diphenhydrAMINE (BENADRYL) 25 MG tablet Take 1 tablet an hour before MRI.  Then take 1 tablet at bedtime as needed, per your usual regimen. 30 tablet 0  . DULoxetine (CYMBALTA) 60 MG capsule TAKE 1 CAPSULE BY MOUTH  DAILY 90 capsule 1  . EPINEPHrine 0.3 mg/0.3 mL IJ SOAJ injection Inject 0.3 mLs (0.3 mg total) into the muscle once. 2 Device 0  . Ergocalciferol (VITAMIN D2) 400 UNITS TABS Take 1 tablet by mouth daily.    . Fluticasone-Salmeterol (ADVAIR DISKUS) 250-50 MCG/DOSE AEPB Inhale 1 puff into the lungs 2 (two) times daily. 180 each 1  . Investigational palbociclib  (IBRANCE) 125 MG capsule [facility] AFT-05 [name] Take 1 capsule (125 mg total) by mouth daily. Take with food. Swallow whole. Do not chew. Take on days 1-21. Repeat every 28 days. 69 capsule 0  . levothyroxine (SYNTHROID, LEVOTHROID) 150 MCG tablet Take 1 tablet (150 mcg total) by mouth daily. 90 tablet 3  . meloxicam (MOBIC) 7.5 MG tablet TAKE 1 TABLET BY MOUTH  DAILY 90 tablet 1  . mirabegron ER (MYRBETRIQ) 50 MG TB24 tablet Take 1 tablet (50 mg total) by mouth daily. 90 tablet 1  . Multiple Vitamin (MULTIVITAMIN) tablet Take 1 tablet by mouth daily.      . nitroGLYCERIN (NITROSTAT) 0.4 MG SL tablet Place 1 tablet (0.4 mg total) under the tongue every 5 (five) minutes as needed for chest pain. 30 tablet 0  . Omega-3 Fatty Acids (FISH OIL) 1200 MG CAPS Take 1 capsule by mouth daily.      . simvastatin (ZOCOR) 40 MG tablet TAKE 1 TABLET BY MOUTH  DAILY 90 tablet 1  . traMADol (ULTRAM) 50 MG tablet Take 50 mg by mouth every 6 (six) hours as needed.    . Vitamin D, Ergocalciferol, (DRISDOL) 50000 units CAPS capsule Take 1 capsule (50,000 Units total) by mouth every 7 (seven) days. 4 capsule 0   No current facility-administered medications on file prior to visit.     PAST MEDICAL HISTORY: Past Medical History:  Diagnosis Date  . Abnormally small mouth   . Achilles tendon disorder, right   . Anemia   . Anxiety   . Arthritis    hips and knees  . Asthma    daily inhaler, prn inhaler and neb.  . Asthma due to environmental allergies   . Breast cancer ([place]) 01/2014   left  . Cancer ([place])   . Chest pain   . COPD (chronic obstructive pulmonary disease) ([place])   . Dental bridge present    upper front and lower right  . Dental crowns present    x 3  . Depression   . Family history of anesthesia complication    twin brother aspirated and died on OR table, per pt.  . Fibromyalgia   . Gallbladder disease   . GERD (gastroesophageal reflux disease)   . H/O blood clots   . History of  gastric ulcer    as a teenager  . History of seizure age 32   as a reaction to Penicillin - no seizures since  . History of stomach ulcers   . History of thyroid cancer    s/p thyroidectomy  . Hyperlipidemia   . Hypothyroidism   . Joint pain   . Left knee injury   . Liver disorder   . Lymphedema  left arm  . Menopause   . Migraines   . Multiple food allergies   . Obesity   . OSA (obstructive sleep apnea) 02/16/2016  . Palpitations   . Personal history of chemotherapy 2015  . Personal history of radiation therapy 2015   Left  . Prediabetes   . Rheumatoid arthritis ([place])   . SOB (shortness of breath)   . Swelling of both ankles   . Tinnitus   . UTI (lower urinary tract infection) 02/17/2014  . Vitamin D deficiency     PAST SURGICAL HISTORY: Past Surgical History:  Procedure Laterality Date  . ABDOMINAL HYSTERECTOMY  2004   complete  . ACHILLES TENDON REPAIR Right   . APPENDECTOMY  2004  . BREAST LUMPECTOMY WITH NEEDLE LOCALIZATION AND AXILLARY SENTINEL LYMPH NODE BX Left 02/23/2014   Procedure: BREAST LUMPECTOMY WITH NEEDLE LOCALIZATION AND AXILLARY SENTINEL LYMPH NODE BIOPSY;  Surgeon: Excell Seltzer, MD;  Location: [facility];  Service: General;  Laterality: Left;  . BREAST SURGERY  2011   left breast biposy  . CHOLECYSTECTOMY  1990  . COLONOSCOPY W/ POLYPECTOMY  06/2009  . EYE SURGERY Right 2013   exc. warts from underneath eyelid  . INCONTINENCE SURGERY  2004  . KNEE ARTHROSCOPY Bilateral    x 6 each knee  . LIGAMENT REPAIR Right    thumb/wrist  . ORIF TOE FRACTURE Right    great toe  . PORTACATH PLACEMENT N/A 03/24/2014   Procedure: INSERTION PORT-A-CATH;  Surgeon: Excell Seltzer, MD;  Location: [facility];  Service: General;  Laterality: N/A;  . THYROIDECTOMY  2000  . TONSILLECTOMY AND ADENOIDECTOMY  2000  . TUMOR EXCISION     from thoracic spine    SOCIAL HISTORY: Social History  Substance Use Topics  . Smoking  status: Never Smoker  . Smokeless tobacco: Never Used  . Alcohol use No    FAMILY HISTORY: Family History  Problem Relation Age of Onset  . Alcohol abuse Mother   . Arthritis Mother   . Hypertension Mother   . Bipolar disorder Mother   . Breast cancer Mother 69       unconfirmed  . Lung cancer Mother 77       smoker  . Hyperlipidemia Mother   . Stroke Mother   . Cancer Mother   . Depression Mother   . Anxiety disorder Mother   . Alcoholism Mother   . Drug abuse Mother   . Eating disorder Mother   . Obesity Mother   . Alcohol abuse Father   . Cancer Father        lung  . Hyperlipidemia Father   . Kidney disease Father   . Diabetes Father   . Hypertension Father   . Cystic kidney disease Father   . Thyroid disease Father   . Liver disease Father   . Alcoholism Father   . Arthritis Maternal Grandmother   . Diabetes Paternal Grandmother   . Thyroid cancer Sister 51       type?; currently 25  . Other Sister        ovarian tumor @ 103; TAH/BSO  . Thyroid cancer Brother        dx 66s; currently 40  . Breast cancer Maternal Aunt        dx 69s; deceased 36  . Thyroid cancer Paternal Aunt        All 3 paternal aunts with thyroid ca in 30s/40s  . Lung cancer Paternal  Aunt        2 of 3 paternal aunts with lung cancer    ROS: Review of Systems  Constitutional: Positive for weight loss.  Cardiovascular: Negative for chest pain.  Gastrointestinal: Negative for nausea and vomiting.  Musculoskeletal:       Negative muscle weakness  Psychiatric/Behavioral: Positive for depression. Negative for suicidal ideas.    PHYSICAL EXAM: Blood pressure 125/79, pulse 72, temperature 98.1 F (36.7 C), temperature source Oral, height 5\' 7"  (1.702 m), weight 263 lb (119.3 kg), SpO2 98 %. Body mass index is 41.19 kg/m. Physical Exam  Constitutional: She is oriented to person, place, and time. She appears well-developed and well-nourished.  Cardiovascular: Normal rate.     Pulmonary/Chest: Effort normal.  Neurological: She is oriented to person, place, and time.  Skin: Skin is warm and dry.  Vitals reviewed.   RECENT LABS AND TESTS: BMET    Component Value Date/Time   NA 142 09/20/2016 1045   NA 143 07/03/2016 0946   K 4.6 09/20/2016 1045   K 4.1 07/03/2016 0946   CL 100 09/20/2016 1045   CO2 29 09/20/2016 1045   CO2 29 07/03/2016 0946   GLUCOSE 93 09/20/2016 1045   GLUCOSE 115 07/03/2016 0946   BUN 12 09/20/2016 1045   BUN 8.8 07/03/2016 0946   CREATININE 0.92 09/20/2016 1045   CREATININE 0.9 07/03/2016 0946   CALCIUM 10.1 09/20/2016 1045   CALCIUM 9.8 07/03/2016 0946   GFRNONAA 67 09/20/2016 1045   GFRAA 77 09/20/2016 1045   Lab Results  Component Value Date   HGBA1C 5.6 09/20/2016   HGBA1C 5.7 (H) 04/10/2016   HGBA1C 5.9 (H) 10/25/2015   HGBA1C 5.7 (H) 05/10/2015   HGBA1C 5.8 (H) 12/17/2014   Lab Results  Component Value Date   INSULIN 21.8 09/20/2016   CBC    Component Value Date/Time   WBC 2.5 (L) 09/25/2016 1014   WBC 4.0 09/13/2015 1414   RBC 4.69 09/25/2016 1014   RBC 4.90 09/13/2015 1414   HGB 13.2 09/25/2016 1014   HCT 40.5 09/25/2016 1014   PLT 236 09/25/2016 1014   MCV 86.5 09/25/2016 1014   MCH 28.2 09/25/2016 1014   MCH 26.7 09/16/2014 1140   MCHC 32.6 09/25/2016 1014   MCHC 32.7 09/13/2015 1414   RDW 16.4 (H) 09/25/2016 1014   LYMPHSABS 0.9 09/25/2016 1014   MONOABS 0.4 09/25/2016 1014   EOSABS 0.0 09/25/2016 1014   EOSABS 0.0 09/20/2016 1045   BASOSABS 0.0 09/25/2016 1014   Iron/TIBC/Ferritin/ %Sat No results found for: IRON, TIBC, FERRITIN, IRONPCTSAT Lipid Panel     Component Value Date/Time   CHOL 177 09/20/2016 1045   TRIG 143 09/20/2016 1045   HDL 70 09/20/2016 1045   CHOLHDL 3 01/17/2016 1547   VLDL 33.0 01/17/2016 1547   LDLCALC 78 09/20/2016 1045   LDLDIRECT 102.0 07/08/2014 1107   Hepatic Function Panel     Component Value Date/Time   PROT 6.7 09/20/2016 1045   PROT 6.4 07/03/2016  0946   ALBUMIN 4.2 09/20/2016 1045   ALBUMIN 3.7 07/03/2016 0946   AST 30 09/20/2016 1045   AST 40 (H) 07/03/2016 0946   ALT 39 (H) 09/20/2016 1045   ALT 67 (H) 07/03/2016 0946   ALKPHOS 60 09/20/2016 1045   ALKPHOS 64 07/03/2016 0946   BILITOT 0.4 09/20/2016 1045   BILITOT 0.39 07/03/2016 0946   BILIDIR 0.1 08/11/2013 0918   IBILI 0.3 08/11/2013 0918      Component  Value Date/Time   TSH 0.84 12/12/2016 0909   TSH 5.600 (H) 09/20/2016 1045   TSH 0.17 (L) 07/08/2014 1107    ASSESSMENT AND PLAN: Other depression - Plan: topiramate (TOPAMAX) 50 MG tablet  Vitamin D deficiency - Plan: Vitamin D, Ergocalciferol, (DRISDOL) 50000 units CAPS capsule  At risk for heart disease  Class 3 obesity without serious comorbidity with body mass index (BMI) of 40.0 to 44.9 in adult, unspecified obesity type ([place])  PLAN:  Vitamin D Deficiency Brady was informed that low vitamin D levels contributes to fatigue and are associated with obesity, breast, and colon cancer. She agrees to continue to take prescription Vit D @50 ,000 IU every week, we will refill for 1 month and will follow up for routine testing of vitamin D, at least 2-3 times per year. She was informed of the risk of over-replacement of vitamin D and agrees to not increase her dose unless he discusses this with [place] first. Lynesha agrees to follow up with our clinic in 2 to 3 weeks.  Depression with Emotional Eating Behaviors We discussed behavior modification techniques today to help Willean deal with her emotional eating and depression. She has agreed to increase topamax to 50 mg every night #30 with no refills and will follow up in 2 to 3 weeks.  Cardiovascular risk counselling Kyndra was given extended (15 minutes) coronary artery disease prevention counseling today. She is 61 y.o. female and has risk factors for heart disease including obesity. We discussed intensive lifestyle modifications today with an emphasis on specific weight  loss instructions and strategies. Pt was also informed of the importance of increasing exercise and decreasing saturated fats to help prevent heart disease.  Obesity Lorretta is currently in the action stage of change. As such, her goal is to continue with weight loss efforts She has agreed to keep a food journal with 1400 calories and 75+ grams of protein  Omelia has been instructed to work up to a goal of 150 minutes of combined cardio and strengthening exercise per week for weight loss and overall health benefits. We discussed the following Behavioral Modification Strategies today: increasing lean protein intake and decreasing simple carbohydrates   Michaeline has agreed to follow up with our clinic in 2 to 3 weeks. She was informed of the importance of frequent follow up visits to maximize her success with intensive lifestyle modifications for her multiple health conditions.  I, Doreene Nest, am acting as transcriptionist for Dennard Nip, MD  I have reviewed the above documentation for accuracy and completeness, and I agree with the above. -Dennard Nip, MD   OBESITY BEHAVIORAL INTERVENTION VISIT  Today's visit was # 7 out of 22.  Starting weight: 274 lbs Starting date: 09/20/16 Today's weight : 263 lbs Today's date: 01/01/2017 Total lbs lost to date: 11 (Patients must lose 7 lbs in the first 6 months to continue with counseling)   ASK: We discussed the diagnosis of obesity with Tana Conch today and Leda Gauze agreed to give [place] permission to discuss obesity behavioral modification therapy today.  ASSESS: Azul has the diagnosis of obesity and her BMI today is 41.3 Ruhama is in the action stage of change   ADVISE: Hiromi was educated on the multiple health risks of obesity as well as the benefit of weight loss to improve her health. She was advised of the need for long term treatment and the importance of lifestyle modifications.  AGREE: Multiple dietary modification  options and treatment options were discussed and  Tanicka agreed to keep a food journal with 1400 calories and 75+ grams of protein  We discussed the following Behavioral Modification Strategies today: increasing lean protein intake and decreasing simple carbohydrates

## 2017-01-07 ENCOUNTER — Other Ambulatory Visit: Payer: Self-pay | Admitting: Family

## 2017-01-08 DIAGNOSIS — G5602 Carpal tunnel syndrome, left upper limb: Secondary | ICD-10-CM | POA: Diagnosis not present

## 2017-01-08 DIAGNOSIS — G5601 Carpal tunnel syndrome, right upper limb: Secondary | ICD-10-CM | POA: Diagnosis not present

## 2017-01-09 ENCOUNTER — Ambulatory Visit: Payer: 59 | Admitting: Family

## 2017-01-10 ENCOUNTER — Ambulatory Visit
Admission: RE | Admit: 2017-01-10 | Discharge: 2017-01-10 | Disposition: A | Discharge status: Home / Self Care | Payer: 59 | Source: Ambulatory Visit | Attending: Internal Medicine | Admitting: Internal Medicine

## 2017-01-10 ENCOUNTER — Ambulatory Visit: Payer: 59 | Admitting: Family

## 2017-01-10 DIAGNOSIS — C73 Malignant neoplasm of thyroid gland: Secondary | ICD-10-CM | POA: Diagnosis not present

## 2017-01-14 ENCOUNTER — Other Ambulatory Visit: Payer: Self-pay | Admitting: Family

## 2017-01-16 ENCOUNTER — Ambulatory Visit (INDEPENDENT_AMBULATORY_CARE_PROVIDER_SITE_OTHER): Payer: 59 | Admitting: Family Medicine

## 2017-01-16 ENCOUNTER — Encounter (INDEPENDENT_AMBULATORY_CARE_PROVIDER_SITE_OTHER): Payer: Self-pay

## 2017-01-17 ENCOUNTER — Other Ambulatory Visit: Payer: Self-pay

## 2017-01-17 MED ORDER — MELOXICAM 7.5 MG PO TABS: 7.5000 mg | ORAL_TABLET | Freq: Every day | ORAL | 1 refills | Status: DC

## 2017-01-18 ENCOUNTER — Other Ambulatory Visit: Payer: Self-pay | Admitting: Medical Oncology

## 2017-01-18 DIAGNOSIS — Z170 Estrogen receptor positive status [ER+]: Principal | ICD-10-CM

## 2017-01-18 DIAGNOSIS — C50412 Malignant neoplasm of upper-outer quadrant of left female breast: Secondary | ICD-10-CM

## 2017-01-23 ENCOUNTER — Telehealth: Payer: Self-pay | Admitting: Hematology and Oncology

## 2017-01-23 ENCOUNTER — Ambulatory Visit (HOSPITAL_BASED_OUTPATIENT_CLINIC_OR_DEPARTMENT_OTHER): Payer: 59 | Admitting: Hematology and Oncology

## 2017-01-23 ENCOUNTER — Other Ambulatory Visit (HOSPITAL_BASED_OUTPATIENT_CLINIC_OR_DEPARTMENT_OTHER): Payer: 59

## 2017-01-23 ENCOUNTER — Encounter: Payer: Self-pay | Admitting: Medical Oncology

## 2017-01-23 DIAGNOSIS — Z170 Estrogen receptor positive status [ER+]: Secondary | ICD-10-CM

## 2017-01-23 DIAGNOSIS — C50412 Malignant neoplasm of upper-outer quadrant of left female breast: Secondary | ICD-10-CM

## 2017-01-23 DIAGNOSIS — Z85850 Personal history of malignant neoplasm of thyroid: Secondary | ICD-10-CM | POA: Diagnosis not present

## 2017-01-23 DIAGNOSIS — Z006 Encounter for examination for normal comparison and control in clinical research program: Secondary | ICD-10-CM

## 2017-01-23 LAB — COMPREHENSIVE METABOLIC PANEL
ALT: 39 U/L (ref 0–55)
AST: 24 U/L (ref 5–34)
Albumin: 3.8 g/dL (ref 3.5–5.0)
Alkaline Phosphatase: 72 U/L (ref 40–150)
Anion Gap: 6 mEq/L (ref 3–11)
BUN: 15.2 mg/dL (ref 7.0–26.0)
CHLORIDE: 107 meq/L (ref 98–109)
CO2: 26 meq/L (ref 22–29)
CREATININE: 1 mg/dL (ref 0.6–1.1)
Calcium: 10 mg/dL (ref 8.4–10.4)
EGFR: 57 mL/min/{1.73_m2} — ABNORMAL LOW (ref >90)
GLUCOSE: 114 mg/dL (ref 70–140)
Potassium: 3.9 mEq/L (ref 3.5–5.1)
SODIUM: 139 meq/L (ref 136–145)
Total Bilirubin: 0.41 mg/dL (ref 0.20–1.20)
Total Protein: 6.9 g/dL (ref 6.4–8.3)

## 2017-01-23 LAB — CBC WITH DIFFERENTIAL/PLATELET
BASO%: 1 % (ref 0.0–2.0)
Basophils Absolute: 0.1 10*3/uL (ref 0.0–0.1)
EOS%: 3 % (ref 0.0–7.0)
Eosinophils Absolute: 0.2 10*3/uL (ref 0.0–0.5)
HCT: 41.4 % (ref 34.8–46.6)
HGB: 13.7 g/dL (ref 11.6–15.9)
LYMPH%: 22.2 % (ref 14.0–49.7)
MCH: 27.4 pg (ref 25.1–34.0)
MCHC: 33 g/dL (ref 31.5–36.0)
MCV: 82.8 fL (ref 79.5–101.0)
MONO#: 0.5 10*3/uL (ref 0.1–0.9)
MONO%: 9.3 % (ref 0.0–14.0)
NEUT%: 64.5 % (ref 38.4–76.8)
NEUTROS ABS: 3.5 10*3/uL (ref 1.5–6.5)
Platelets: 266 10*3/uL (ref 145–400)
RBC: 5 10*6/uL (ref 3.70–5.45)
RDW: 15.8 % — ABNORMAL HIGH (ref 11.2–14.5)
WBC: 5.4 10*3/uL (ref 3.9–10.3)
lymph#: 1.2 10*3/uL (ref 0.9–3.3)

## 2017-01-23 LAB — RESEARCH LABS

## 2017-01-23 NOTE — Telephone Encounter (Signed)
Spoke with patient regarding her upcoming appts.  °
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
## 2017-01-23 NOTE — Assessment & Plan Note (Signed)
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 M0 stage IIB Adjuvant chemotherapy Completed 4 cycles of dose dense Adriamycin and Cytoxan. Followed by Abraxane X 12 completed 08/18/14, status post radiation completed June 2016, started anastrozole 1 mg daily 11/20/2014 (also enrolled on [name] clinical trial randomized to [facility])   Lymphedema left arm: Improved with physical therapy for lymphedema History of thyroid cancer:Low likelihood of recurrence  Current treatment: Adjuvant antiestrogen therapy with anastrozole once daily for 5 years started 11/20/2014 + Ibrance ([name] trial)  Ibrance toxicities: 1. Grade 2 neutropenia: Did not require dose reduction, close monitoring of blood counts, ANC  1.1 on 09/25/2016. Patient will remain on 125 mg dose 2. Otherwise no major side effects to Ibrance 3. 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  continue the same dosage of Ibrance.  Survivorship: Patient is now swimming and exercising every day. Is staying active.  She is also seeing a weight loss specialist who put her on a daily basis with. Weight loss diet.   Breast Cancer Surveillance: 1. Breast exam: 01/23/2017  Normal except for scar tissue related to prior surgery and radiation 2. Mammogram February 2018Category B breast density. No mammographic abnormalities postsurgical changes.  RTC6 months for labs and follow-up

## 2017-01-23 NOTE — Progress Notes (Signed)
Patient Care Team: Debbrah Alar, NP as PCP - General (Internal Medicine) Excell Seltzer, MD as Consulting Physician (General Surgery) Nicholas Lose, MD as Consulting Physician (Hematology and Oncology) [name], MD as Attending Physician (Radiation Oncology) Katheran James., MD (Endocrinology) Maxwell Marion, RN as Registered Nurse (Medical Oncology)  DIAGNOSIS:  Encounter Diagnosis  Name Primary?  . Malignant neoplasm of upper-outer quadrant of left breast in female, estrogen receptor positive ([place])     SUMMARY OF ONCOLOGIC HISTORY:   Breast cancer of upper-outer quadrant of left female breast ([place])   12/22/2013 Mammogram    Left breast upper-outer quadrant 1.8 x 1.4 x 1.6 cm mass with left axillary lymph node measuring 3.6 mm      12/30/2013 Breast MRI    Dominant enhancing left upper outer quadrant irregular mass encompassing a confluent area of abnormal clumped nodular enhancement, 5.5 cm overall, 6 mm masslike enhancement central left breast        02/23/2014 Surgery    Left lumpectomy: Invasive ductal carcinoma grade 2 spending 3.8 cm intermediate grade DCIS with lymphovascular invasion margins negative one out of 4 SLN positive, ER 99%, PR 100%, HER-2 negative ratio 0.74, T2, N1, M0 stage IIB      03/19/2014 PET scan    No evidence of distant metastatic disease      03/31/2014 - 08/18/2014 Chemotherapy    Adjuvant chemotherapy with dose dense Adriamycin Cytoxan x4 followed by Abraxane weekly x12      08/28/2014 Imaging    Left brachial vein thrombus      09/11/2014 -  Radiation Therapy    Adjuvant radiation therapy      11/20/2014 -  Anti-estrogen oral therapy    Anastrozole 1 mg daily + Ibrance ([name] clinical trial)       CHIEF COMPLIANT: Completion of clinical trial [name]  INTERVAL HISTORY: Michelle Kennedy is a 61 year old with above-mentioned history of left breast cancer treated with lumpectomy followed by adjuvant chemotherapy.  She is currently on [name] clinical trial and has completed 2 years of treatment.   she did remarkably well without requiring any dose adjustments.   REVIEW OF SYSTEMS:   Constitutional: Denies fevers, chills or abnormal weight loss Eyes: Denies blurriness of vision Ears, nose, mouth, throat, and face: Denies mucositis or sore throat Respiratory: Denies cough, dyspnea or wheezes Cardiovascular: Denies palpitation, chest discomfort Gastrointestinal:  Denies nausea, heartburn or change in bowel habits Skin: Denies abnormal skin rashes Lymphatics: Denies new lymphadenopathy or easy bruising Neurological:Denies numbness, tingling or new weaknesses Behavioral/Psych: Mood is stable, no new changes  Extremities: No lower extremity edema Breast:  denies any pain or lumps or nodules in either breasts All other systems were reviewed with the patient and are negative.  I have reviewed the past medical history, past surgical history, social history and family history with the patient and they are unchanged from previous note.  ALLERGIES:  is allergic to bee venom; contrast media [iodinated diagnostic agents]; iodine; latex; penicillins; shellfish allergy; aspirin; erythromycin; lidocaine; symbicort [budesonide-formoterol fumarate]; codeine; compazine [prochlorperazine maleate]; pentazocine lactate; and sulfonamide derivatives.  MEDICATIONS:  Current Outpatient Prescriptions  Medication Sig Dispense Refill  . albuterol (PROAIR HFA) 108 (90 Base) MCG/ACT inhaler INHALE 2 PUFFS INTO THE LUNGS EVERY 6 HOURS AS NEEDED 3 Inhaler 0  . albuterol (PROVENTIL) (5 MG/ML) 0.5% nebulizer solution Take 2.5 mg by nebulization every 6 (six) hours as needed for wheezing or shortness of breath.    . anastrozole (ARIMIDEX) 1  MG tablet TAKE 1 TABLET BY MOUTH  DAILY 90 tablet 3  . Calcium Carbonate (CALCIUM 500 PO) Take 1 tablet by mouth daily.     . diphenhydrAMINE (BENADRYL) 25 MG tablet Take 1 tablet an hour before  MRI.  Then take 1 tablet at bedtime as needed, per your usual regimen. 30 tablet 0  . DULoxetine (CYMBALTA) 60 MG capsule TAKE 1 CAPSULE BY MOUTH  DAILY 90 capsule 1  . EPINEPHrine 0.3 mg/0.3 mL IJ SOAJ injection Inject 0.3 mLs (0.3 mg total) into the muscle once. 2 Device 0  . Ergocalciferol (VITAMIN D2) 400 UNITS TABS Take 1 tablet by mouth daily.    . Fluticasone-Salmeterol (ADVAIR DISKUS) 250-50 MCG/DOSE AEPB Inhale 1 puff into the lungs 2 (two) times daily. 180 each 1  . Investigational palbociclib (IBRANCE) 125 MG capsule [facility] AFT-05 [name] Take 1 capsule (125 mg total) by mouth daily. Take with food. Swallow whole. Do not chew. Take on days 1-21. Repeat every 28 days. 69 capsule 0  . levothyroxine (SYNTHROID, LEVOTHROID) 150 MCG tablet Take 1 tablet (150 mcg total) by mouth daily. 90 tablet 3  . meloxicam (MOBIC) 7.5 MG tablet Take 1 tablet (7.5 mg total) by mouth daily. 90 tablet 1  . mirabegron ER (MYRBETRIQ) 50 MG TB24 tablet Take 1 tablet (50 mg total) by mouth daily. 90 tablet 1  . Multiple Vitamin (MULTIVITAMIN) tablet Take 1 tablet by mouth daily.      . nitroGLYCERIN (NITROSTAT) 0.4 MG SL tablet Place 1 tablet (0.4 mg total) under the tongue every 5 (five) minutes as needed for chest pain. 30 tablet 0  . Omega-3 Fatty Acids (FISH OIL) 1200 MG CAPS Take 1 capsule by mouth daily.      . simvastatin (ZOCOR) 40 MG tablet TAKE 1 TABLET BY MOUTH  DAILY 90 tablet 1  . topiramate (TOPAMAX) 50 MG tablet Take 1 tablet (50 mg total) by mouth daily. 30 tablet 0  . traMADol (ULTRAM) 50 MG tablet Take 50 mg by mouth every 6 (six) hours as needed.    . Vitamin D, Ergocalciferol, (DRISDOL) 50000 units CAPS capsule Take 1 capsule (50,000 Units total) by mouth every 7 (seven) days. 4 capsule 0   No current facility-administered medications for this visit.     PHYSICAL EXAMINATION: ECOG PERFORMANCE STATUS: 1 - Symptomatic but completely ambulatory  Vitals:   01/23/17 1159  BP: (!)  157/98  Pulse: 75  Resp: 18  Temp: 98.2 F (36.8 C)  SpO2: 99%   Filed Weights   01/23/17 1159  Weight: 267 lb 1.6 oz (121.2 kg)    GENERAL:alert, no distress and comfortable SKIN: skin color, texture, turgor are normal, no rashes or significant lesions EYES: normal, Conjunctiva are pink and non-injected, sclera clear OROPHARYNX:no exudate, no erythema and lips, buccal mucosa, and tongue normal  NECK: supple, thyroid normal size, non-tender, without nodularity LYMPH:  no palpable lymphadenopathy in the cervical, axillary or inguinal LUNGS: clear to auscultation and percussion with normal breathing effort HEART: regular rate & rhythm and no murmurs and no lower extremity edema ABDOMEN:abdomen soft, non-tender and normal bowel sounds MUSCULOSKELETAL:no cyanosis of digits and no clubbing  NEURO: alert & oriented x 3 with fluent speech, no focal motor/sensory deficits EXTREMITIES: No lower extremity edema BREAST: No palpable masses or nodules in either right or left breasts. No palpable axillary supraclavicular or infraclavicular adenopathy no breast tenderness or nipple discharge. (exam performed in the presence of a chaperone)  LABORATORY DATA:  I have reviewed the data as listed   Chemistry      Component Value Date/Time   NA 142 09/20/2016 1045   NA 143 07/03/2016 0946   K 4.6 09/20/2016 1045   K 4.1 07/03/2016 0946   CL 100 09/20/2016 1045   CO2 29 09/20/2016 1045   CO2 29 07/03/2016 0946   BUN 12 09/20/2016 1045   BUN 8.8 07/03/2016 0946   CREATININE 0.92 09/20/2016 1045   CREATININE 0.9 07/03/2016 0946      Component Value Date/Time   CALCIUM 10.1 09/20/2016 1045   CALCIUM 9.8 07/03/2016 0946   ALKPHOS 60 09/20/2016 1045   ALKPHOS 64 07/03/2016 0946   AST 30 09/20/2016 1045   AST 40 (H) 07/03/2016 0946   ALT 39 (H) 09/20/2016 1045   ALT 67 (H) 07/03/2016 0946   BILITOT 0.4 09/20/2016 1045   BILITOT 0.39 07/03/2016 0946       Lab Results  Component Value  Date   WBC 5.4 01/23/2017   HGB 13.7 01/23/2017   HCT 41.4 01/23/2017   MCV 82.8 01/23/2017   PLT 266 01/23/2017   NEUTROABS 3.5 01/23/2017    ASSESSMENT & PLAN:  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 M0 stage IIB Adjuvant chemotherapy Completed 4 cycles of dose dense Adriamycin and Cytoxan. Followed by Abraxane X 12 completed 08/18/14, status post radiation completed June 2016, started anastrozole 1 mg daily 11/20/2014 (also enrolled on [name] clinical trial randomized to [facility])   Lymphedema left arm: Improved with physical therapy for lymphedema History of thyroid cancer:Low likelihood of recurrence  Current treatment: Adjuvant antiestrogen therapy with anastrozole once daily for 5 years started 11/20/2014 + Ibrance ([name] trial)  Ibrance toxicities: 1. Grade 2 neutropenia: Did not require dose reduction, close monitoring of blood counts, ANC  1.1 on 09/25/2016. Patient will remain on 125 mg dose 2. Otherwise no major side effects to Ibrance 3. 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  Patient completed 2 years of Palbociclib. This concludes our treatment on a clinical trial.  Survivorship: Patient is now swimming and exercising every day. Is staying active.  She is also seeing a weight loss specialist who put her on a daily basis with. Weight loss diet.   Breast Cancer Surveillance: 1. Breast exam: 01/23/2017  Normal except for scar tissue related to prior surgery and radiation 2. Mammogram February 2018Category B breast density. No mammographic abnormalities postsurgical changes.  RTCone year for labs and follow-up   I spent 25 minutes talking to the patient of which more than half was spent in counseling and coordination of care.  No orders of the defined types were placed in this  encounter.  The patient has a good understanding of the overall plan. she agrees with it. she will call with any problems that may develop before the next visit here.   Rulon Eisenmenger, MD 01/23/17

## 2017-01-24 LAB — HEMOGLOBIN A1C
ESTIMATED AVERAGE GLUCOSE: 117 mg/dL
HEMOGLOBIN A1C: 5.7 % — AB (ref 4.8–5.6)

## 2017-01-31 DIAGNOSIS — G5601 Carpal tunnel syndrome, right upper limb: Secondary | ICD-10-CM | POA: Diagnosis not present

## 2017-01-31 DIAGNOSIS — G5603 Carpal tunnel syndrome, bilateral upper limbs: Secondary | ICD-10-CM | POA: Diagnosis not present

## 2017-01-31 DIAGNOSIS — G5602 Carpal tunnel syndrome, left upper limb: Secondary | ICD-10-CM | POA: Diagnosis not present

## 2017-02-18 ENCOUNTER — Other Ambulatory Visit (INDEPENDENT_AMBULATORY_CARE_PROVIDER_SITE_OTHER): Payer: Self-pay | Admitting: Family Medicine

## 2017-02-18 DIAGNOSIS — F3289 Other specified depressive episodes: Secondary | ICD-10-CM

## 2017-02-22 DIAGNOSIS — G5601 Carpal tunnel syndrome, right upper limb: Secondary | ICD-10-CM | POA: Diagnosis not present

## 2017-02-25 ENCOUNTER — Other Ambulatory Visit: Payer: Self-pay | Admitting: Family

## 2017-02-25 DIAGNOSIS — Z23 Encounter for immunization: Secondary | ICD-10-CM | POA: Diagnosis not present

## 2017-02-26 NOTE — Telephone Encounter (Signed)
Rx approved and sent to the pharmacy by e-script.//AB/CMA 

## 2017-03-19 ENCOUNTER — Ambulatory Visit: Payer: 59 | Admitting: Internal Medicine

## 2017-03-20 ENCOUNTER — Encounter: Payer: Self-pay | Admitting: Internal Medicine

## 2017-03-20 ENCOUNTER — Ambulatory Visit (INDEPENDENT_AMBULATORY_CARE_PROVIDER_SITE_OTHER): Payer: 59 | Admitting: Internal Medicine

## 2017-03-20 VITALS — BP 132/82 | HR 75 | Wt 266.0 lb

## 2017-03-20 DIAGNOSIS — E890 Postprocedural hypothyroidism: Secondary | ICD-10-CM

## 2017-03-20 DIAGNOSIS — C73 Malignant neoplasm of thyroid gland: Secondary | ICD-10-CM | POA: Diagnosis not present

## 2017-03-20 LAB — T4, FREE: FREE T4: 0.87 ng/dL (ref 0.60–1.60)

## 2017-03-20 LAB — TSH: TSH: 2.91 u[IU]/mL (ref 0.35–4.50)

## 2017-03-20 NOTE — Progress Notes (Addendum)
Patient ID: Michelle Kennedy, female   DOB: 06/02/1955, 61 y.o.   MRN: [id number]    HPI  Michelle Kennedy is a 61 y.o.-year-old female, referred by her PCP, Debbrah Alar, NP, returning for f/u for h/o papillary thyroid cancer and postsurgical hypothyroidism. She saw Dr. Iran Planas before. Last visit with me 3 mo ago.  Reviewed and addended hx: Pt. has been dx with ThyCA in 2002, and had total thyroidectomy then [facility]). No RAI tx 2/2 severe allergy. Reportedly, no postop U/S's checked.  At last visit, we checked a neck U/S (12/2016): Complete thyroidectomy without significant abnormality by ultrasound.  She is on Levothyroxine 150 mcg daily - last dose change was in 11/06/2016.  Pt is taking the LT4: - in am - fasting - with water - was taking it with MVI >> separated at last visit - + Ca and occas. PPI at night - at least 30 min from b'fast - no Fe - not on Biotin  I reviewed pt's thyroid tests: Lab Results  Component Value Date   TSH 0.84 12/12/2016   TSH 5.600 (H) 09/20/2016   TSH 0.17 (L) 07/08/2014   TSH 0.084 (L) 09/24/2013   TSH 1.740 04/05/2011   TSH 0.075 (L) 09/21/2010   TSH 0.765 12/21/2009   FREET4 0.90 12/12/2016   FREET4 1.16 09/20/2016   Prev. Very suppressed TSH levels per review of Care Everywhere records.  Tg levels: Component     Latest Ref Rng & Units 12/12/2016  Thyroglobulin     ng/mL 0.1 (L)  Thyroglobulin Ab     <2 IU/mL <1  10/11/2015: Tg <0.1, ATA <1  Pt denies: - feeling nodules in neck - hoarseness - dysphagia - choking - SOB with lying down  She has + FH of thyroid disorders in: siblings: goiter. + FH of thyroid cancer: P aunts and P uncle. No h/o radiation tx to head or neck. Had RxTx for BrCA - but not to neck. No recent iodine supplement use.  Pt. also has a history of BrCA in 2016.   She was seeing Dr. Dennard Nip in the Weight Loss clinic >> stopped. She walks 35-40 min on treadmill  daily  ROS: Constitutional: no weight gain/no weight loss, +++ fatigue, no subjective hyperthermia, no subjective hypothermia Eyes: no blurry vision, no xerophthalmia ENT: no sore throat, + see HPI Cardiovascular: no CP/no SOB/no palpitations/no leg swelling Respiratory: no cough/no SOB/no wheezing Gastrointestinal: no N/no V/no D/no C/no acid reflux Musculoskeletal: no muscle aches/no joint aches Skin: no rashes, no hair loss Neurological: no tremors/no numbness/no tingling/no dizziness  I reviewed pt's medications, allergies, PMH, social hx, family hx, and changes were documented in the history of present illness. Otherwise, unchanged from my initial visit note.  Past Medical History:  Diagnosis Date  . Abnormally small mouth   . Achilles tendon disorder, right   . Anemia   . Anxiety   . Arthritis    hips and knees  . Asthma    daily inhaler, prn inhaler and neb.  . Asthma due to environmental allergies   . Breast cancer ([place]) 01/2014   left  . Cancer ([place])   . Chest pain   . COPD (chronic obstructive pulmonary disease) ([place])   . Dental bridge present    upper front and lower right  . Dental crowns present    x 3  . Depression   . Family history of anesthesia complication    twin brother aspirated and died  on OR table, per pt.  . Fibromyalgia   . Gallbladder disease   . GERD (gastroesophageal reflux disease)   . H/O blood clots   . History of gastric ulcer    as a teenager  . History of seizure age 7   as a reaction to Penicillin - no seizures since  . History of stomach ulcers   . History of thyroid cancer    s/p thyroidectomy  . Hyperlipidemia   . Hypothyroidism   . Joint pain   . Left knee injury   . Liver disorder   . Lymphedema    left arm  . Menopause   . Migraines   . Multiple food allergies   . Obesity   . OSA (obstructive sleep apnea) 02/16/2016  . Palpitations   . Personal history of chemotherapy 2015  . Personal history of radiation therapy  2015   Left  . Prediabetes   . Rheumatoid arthritis ([place])   . SOB (shortness of breath)   . Swelling of both ankles   . Tinnitus   . UTI (lower urinary tract infection) 02/17/2014  . Vitamin D deficiency    Past Surgical History:  Procedure Laterality Date  . ABDOMINAL HYSTERECTOMY  2004   complete  . ACHILLES TENDON REPAIR Right   . APPENDECTOMY  2004  . BREAST LUMPECTOMY WITH NEEDLE LOCALIZATION AND AXILLARY SENTINEL LYMPH NODE BX Left 02/23/2014   Procedure: BREAST LUMPECTOMY WITH NEEDLE LOCALIZATION AND AXILLARY SENTINEL LYMPH NODE BIOPSY;  Surgeon: Excell Seltzer, MD;  Location: [place];  Service: General;  Laterality: Left;  . BREAST SURGERY  2011   left breast biposy  . CHOLECYSTECTOMY  1990  . COLONOSCOPY W/ POLYPECTOMY  06/2009  . EYE SURGERY Right 2013   exc. warts from underneath eyelid  . INCONTINENCE SURGERY  2004  . KNEE ARTHROSCOPY Bilateral    x 6 each knee  . LIGAMENT REPAIR Right    thumb/wrist  . ORIF TOE FRACTURE Right    great toe  . PORTACATH PLACEMENT N/A 03/24/2014   Procedure: INSERTION PORT-A-CATH;  Surgeon: Excell Seltzer, MD;  Location: [place];  Service: General;  Laterality: N/A;  . THYROIDECTOMY  2000  . TONSILLECTOMY AND ADENOIDECTOMY  2000  . TUMOR EXCISION     from thoracic spine   Social History   Social History  . Marital status: Married    Spouse name: N/A  . Number of children: 2   Occupational History  . 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 Smoking status: Never Smoker  . Smokeless tobacco: Never Used  . Alcohol use No  . Drug use: No   Social History Narrative   Regular exercise: yes   Current Outpatient Prescriptions on File Prior to Visit  Medication Sig Dispense Refill  . albuterol (PROAIR HFA) 108 (90 Base) MCG/ACT inhaler INHALE 2 PUFFS INTO THE LUNGS EVERY 6 HOURS AS NEEDED 3 Inhaler 0  . albuterol (PROVENTIL) (5 MG/ML) 0.5% nebulizer solution Take 2.5 mg  by nebulization every 6 (six) hours as needed for wheezing or shortness of breath.    . anastrozole (ARIMIDEX) 1 MG tablet TAKE 1 TABLET BY MOUTH  DAILY 90 tablet 3  . Calcium Carbonate (CALCIUM 500 PO) Take 1 tablet by mouth daily.     . diphenhydrAMINE (BENADRYL) 25 MG tablet Take 1 tablet an hour before MRI.  Then take 1 tablet at bedtime as needed, per your usual regimen. 30 tablet  0  . DULoxetine (CYMBALTA) 60 MG capsule TAKE 1 CAPSULE BY MOUTH  DAILY 90 capsule 1  . EPINEPHrine 0.3 mg/0.3 mL IJ SOAJ injection Inject 0.3 mLs (0.3 mg total) into the muscle once. 2 Device 0  . Ergocalciferol (VITAMIN D2) 400 UNITS TABS Take 1 tablet by mouth daily.    . Fluticasone-Salmeterol (ADVAIR DISKUS) 250-50 MCG/DOSE AEPB Inhale 1 puff into the lungs 2 (two) times daily. 180 each 1  . levothyroxine (SYNTHROID, LEVOTHROID) 150 MCG tablet Take 1 tablet (150 mcg total) by mouth daily. 90 tablet 3  . meloxicam (MOBIC) 7.5 MG tablet Take 1 tablet (7.5 mg total) by mouth daily. 90 tablet 1  . Multiple Vitamin (MULTIVITAMIN) tablet Take 1 tablet by mouth daily.      Marland Kitchen MYRBETRIQ 50 MG TB24 tablet TAKE 1 TABLET BY MOUTH  DAILY 90 tablet 0  . nitroGLYCERIN (NITROSTAT) 0.4 MG SL tablet Place 1 tablet (0.4 mg total) under the tongue every 5 (five) minutes as needed for chest pain. 30 tablet 0  . Omega-3 Fatty Acids (FISH OIL) 1200 MG CAPS Take 1 capsule by mouth daily.      . simvastatin (ZOCOR) 40 MG tablet TAKE 1 TABLET BY MOUTH  DAILY 90 tablet 1  . topiramate (TOPAMAX) 50 MG tablet Take 1 tablet (50 mg total) by mouth daily. 30 tablet 0  . traMADol (ULTRAM) 50 MG tablet Take 50 mg by mouth every 6 (six) hours as needed.    . Vitamin D, Ergocalciferol, (DRISDOL) 50000 units CAPS capsule Take 1 capsule (50,000 Units total) by mouth every 7 (seven) days. 4 capsule 0   No current facility-administered medications on file prior to visit.    Allergies  Allergen Reactions  . Bee Venom Anaphylaxis  . Contrast Media  [Iodinated Diagnostic Agents] Shortness Of Breath  . Iodine Other (See Comments)    CARDIAC ARREST  . Latex Anaphylaxis and Rash  . Penicillins Shortness Of Breath, Rash and Other (See Comments)    SEIZURE  . Shellfish Allergy Shortness Of Breath  . Aspirin Rash and Other (See Comments)    GI UPSET  . Erythromycin Swelling and Rash    SWELLING OF JOINTS  . Lidocaine Swelling    SWELLING OF MOUTH AND THROAT  . Symbicort [Budesonide-Formoterol Fumarate] Other (See Comments)    BURNING OF TONGUE AND LIPS  . Codeine Rash  . Compazine [Prochlorperazine Maleate] Rash    Rash on face,chest, arms, back  . Pentazocine Lactate Rash  . Sulfonamide Derivatives Rash   Family History  Problem Relation Age of Onset  . Alcohol abuse Mother   . Arthritis Mother   . Hypertension Mother   . Bipolar disorder Mother   . Breast cancer Mother 73       unconfirmed  . Lung cancer Mother 49       smoker  . Hyperlipidemia Mother   . Stroke Mother   . Cancer Mother   . Depression Mother   . Anxiety disorder Mother   . Alcoholism Mother   . Drug abuse Mother   . Eating disorder Mother   . Obesity Mother   . Alcohol abuse Father   . Cancer Father        lung  . Hyperlipidemia Father   . Kidney disease Father   . Diabetes Father   . Hypertension Father   . Cystic kidney disease Father   . Thyroid disease Father   . Liver disease Father   . Alcoholism  Father   . Arthritis Maternal Grandmother   . Diabetes Paternal Grandmother   . Thyroid cancer Sister 73       type?; currently 27  . Other Sister        ovarian tumor @ 58; TAH/BSO  . Thyroid cancer Brother        dx 37s; currently 9  . Breast cancer Maternal Aunt        dx 60s; deceased 30  . Thyroid cancer Paternal Aunt        All 3 paternal aunts with thyroid ca in 30s/40s  . Lung cancer Paternal Aunt        2 of 3 paternal aunts with lung cancer    PE: BP 132/82 (BP Location: Left Arm, Patient Position: Sitting)   Pulse 75    Wt 266 lb (120.7 kg)   SpO2 96%   BMI 41.66 kg/m  Wt Readings from Last 3 Encounters:  03/20/17 266 lb (120.7 kg)  01/23/17 267 lb 1.6 oz (121.2 kg)  01/01/17 263 lb (119.3 kg)   Constitutional: obese, in NAD Eyes: PERRLA, EOMI, no exophthalmos ENT: moist mucous membranes, no neck masses, healed thyroidectomy scar, no cervical lymphadenopathy Cardiovascular: RRR, No MRG Respiratory: CTA B Gastrointestinal: abdomen soft, NT, ND, BS+ Musculoskeletal: no deformities, strength intact in all 4 Skin: moist, warm, no rashes Neurological: no tremor with outstretched hands, DTR normal in all 4  ASSESSMENT: 1. H/O PTC  2. Hypothyroidism  PLAN:  1. PTC - I do not have records of the initial pathology; no RAI tx b/c allergy to Iodine - She had a previous Tg that was undetectable, as are ATA Abs per records reviewed when she was seeing Dr Tamala Julian. At last visit, her Tg was detectable, but very low, at 0.1. We discussed that this is not uncommon 2/2 assay variability and also as she did not have RAI treatment. We will recheck level at next visit. - reviewed latest neck U/S (12/2016): no mets or recurrences in neck  2. Patient with long-standing postsurgical hypothyroidism, on levothyroxine therapy. Last TSH has been higher, but prev. All TSH levels suppressed.  - latest thyroid labs reviewed with pt >> normal 3 mo ago, on LT4 150 mcg daily - pt feels good on this dose, but she has significant fatigue. We discussed about changes in diet and I made some suggestions. - we discussed about taking the thyroid hormone every day, with water, >30 minutes before breakfast, separated by >4 hours from acid reflux medications, calcium, iron, multivitamins. Pt. is taking it correctly - will check thyroid tests today: TSH and fT4 - If labs are abnormal, she will need to return for repeat TFTs in 1.5 months - OTW, RTC in 6 mo  Component     Latest Ref Rng & Units 03/20/2017  TSH     0.35 - 4.50 uIU/mL 2.91   T4,Free(Direct)     0.60 - 1.60 ng/dL 0.87   Normal TFTs.  Philemon Kingdom, MD PhD [facility] Endocrinology

## 2017-03-20 NOTE — Patient Instructions (Addendum)
Please continue Levothyroxine 150 mcg daily.  Take the thyroid hormone every day, with water, at least 30 minutes before breakfast, separated by at least 4 hours from: - acid reflux medications - calcium - iron - multivitamins  Please look up "The Engine 2 diet" by Christa See. Also try the "7 day Rescue Diet".  Please come back for a follow-up appointment in 6 months.

## 2017-04-30 ENCOUNTER — Other Ambulatory Visit: Payer: Self-pay | Admitting: Family

## 2017-05-30 ENCOUNTER — Other Ambulatory Visit: Payer: Self-pay | Admitting: Hematology and Oncology

## 2017-05-30 DIAGNOSIS — Z9889 Other specified postprocedural states: Secondary | ICD-10-CM

## 2017-06-10 ENCOUNTER — Other Ambulatory Visit: Payer: Self-pay | Admitting: Family

## 2017-07-02 ENCOUNTER — Ambulatory Visit
Admission: RE | Admit: 2017-07-02 | Discharge: 2017-07-02 | Disposition: A | Discharge status: Home / Self Care | Payer: 59 | Source: Ambulatory Visit | Attending: Hematology and Oncology | Admitting: Hematology and Oncology

## 2017-07-02 DIAGNOSIS — R928 Other abnormal and inconclusive findings on diagnostic imaging of breast: Secondary | ICD-10-CM | POA: Diagnosis not present

## 2017-07-02 DIAGNOSIS — Z9889 Other specified postprocedural states: Secondary | ICD-10-CM

## 2017-07-03 DIAGNOSIS — M25562 Pain in left knee: Secondary | ICD-10-CM | POA: Diagnosis not present

## 2017-07-03 DIAGNOSIS — M25561 Pain in right knee: Secondary | ICD-10-CM | POA: Diagnosis not present

## 2017-07-08 ENCOUNTER — Other Ambulatory Visit: Payer: Self-pay | Admitting: Family

## 2017-07-16 ENCOUNTER — Encounter: Payer: Self-pay | Admitting: Family Medicine

## 2017-07-16 ENCOUNTER — Ambulatory Visit (INDEPENDENT_AMBULATORY_CARE_PROVIDER_SITE_OTHER): Payer: 59 | Admitting: Family Medicine

## 2017-07-16 VITALS — BP 142/82 | HR 67 | Temp 97.9°F | Ht 67.0 in | Wt 271.4 lb

## 2017-07-16 DIAGNOSIS — J0100 Acute maxillary sinusitis, unspecified: Secondary | ICD-10-CM

## 2017-07-16 MED ORDER — METHYLPREDNISOLONE ACETATE 80 MG/ML IJ SUSP
80.0000 mg | Freq: Once | INTRAMUSCULAR | Status: AC
  Administered 2017-07-16: 80 mg via INTRAMUSCULAR

## 2017-07-16 MED ORDER — DOXYCYCLINE HYCLATE 100 MG PO TABS: 100.0000 mg | ORAL_TABLET | Freq: Two times a day (BID) | ORAL | 0 refills | Status: DC

## 2017-07-16 NOTE — Addendum Note (Signed)
Addended by: Sharon Seller B on: 07/16/2017 09:38 AM   Modules accepted: Orders

## 2017-07-16 NOTE — Patient Instructions (Signed)
Most sinus infections are viral in etiology and antibiotics will not be helpful. That being said, if you start having worsening symptoms over 3 days, you are worsening by day 10 or not improving by day 14, go ahead and take it. You are on Day 3 as of now.   For symptoms, consider using Vick's VapoRub on chest or under nose, air humidifier, Benadryl at night, and elevating the head of the bed. Tylenol and ibuprofen for aches you may be experiencing.   Given your spring cleaning, something to help with allergies may be helpful. Claritin (loratadine), Allegra (fexofenadine), Zyrtec (cetirizine); these are listed in order from weakest to strongest. Generic, and therefore cheaper, options are in the parentheses.   There are available OTC, and the generic versions, which may be cheaper, are in parentheses. Show this to a pharmacist if you have trouble finding any of these items.  Let us know if you need anything.

## 2017-07-16 NOTE — Progress Notes (Signed)
Chief Complaint  Patient presents with  . Sinusitis  . Wheezing    Michelle Kennedy here for URI complaints.  Duration: 3 days  Associated symptoms: subjective fever, sinus congestion, sinus pain, rhinorrhea, itchy watery eyes, ear pain, wheezing and chest tightness Denies: ear drainage, sore throat, shortness of breath, myalgia and fevers Treatment to date: Sudafed Sick contacts: No  ROS:  Const: Denies fevers HEENT: As noted in HPI Lungs: No SOB  Past Medical History:  Diagnosis Date  . Abnormally small mouth   . Achilles tendon disorder, right   . Anemia   . Anxiety   . Arthritis    hips and knees  . Asthma    daily inhaler, prn inhaler and neb.  . Asthma due to environmental allergies   . Breast cancer ([place]) 01/2014   left  . Cancer ([place])   . Chest pain   . COPD (chronic obstructive pulmonary disease) ([place])   . Dental bridge present    upper front and lower right  . Dental crowns present    x 3  . Depression   . Family history of anesthesia complication    twin brother aspirated and died on OR table, per pt.  . Fibromyalgia   . Gallbladder disease   . GERD (gastroesophageal reflux disease)   . H/O blood clots   . History of gastric ulcer    as a teenager  . History of seizure age 11   as a reaction to Penicillin - no seizures since  . History of stomach ulcers   . History of thyroid cancer    s/p thyroidectomy  . Hyperlipidemia   . Hypothyroidism   . Joint pain   . Left knee injury   . Liver disorder   . Lymphedema    left arm  . Menopause   . Migraines   . Multiple food allergies   . Obesity   . OSA (obstructive sleep apnea) 02/16/2016  . Palpitations   . Personal history of chemotherapy 2015  . Personal history of radiation therapy 2015   Left  . Prediabetes   . Rheumatoid arthritis ([place])   . SOB (shortness of breath)   . Swelling of both ankles   . Tinnitus   . UTI (lower urinary tract infection) 02/17/2014  . Vitamin D deficiency     Family History  Problem Relation Age of Onset  . Alcohol abuse Mother   . Arthritis Mother   . Hypertension Mother   . Bipolar disorder Mother   . Breast cancer Mother 60       unconfirmed  . Lung cancer Mother 34       smoker  . Hyperlipidemia Mother   . Stroke Mother   . Cancer Mother   . Depression Mother   . Anxiety disorder Mother   . Alcoholism Mother   . Drug abuse Mother   . Eating disorder Mother   . Obesity Mother   . Alcohol abuse Father   . Cancer Father        lung  . Hyperlipidemia Father   . Kidney disease Father   . Diabetes Father   . Hypertension Father   . Cystic kidney disease Father   . Thyroid disease Father   . Liver disease Father   . Alcoholism Father   . Arthritis Maternal Grandmother   . Diabetes Paternal Grandmother   . Thyroid cancer Sister 43       type?; currently 73  . Other  Sister        ovarian tumor @ 24; TAH/BSO  . Breast cancer Sister   . Thyroid cancer Brother        dx 73s; currently 58  . Breast cancer Maternal Aunt        dx 78s; deceased 18  . Thyroid cancer Paternal Aunt        All 3 paternal aunts with thyroid ca in 30s/40s  . Lung cancer Paternal Aunt        2 of 3 paternal aunts with lung cancer    BP (!) 142/82 (BP Location: Left Arm, Patient Position: Sitting, Cuff Size: Large)   Pulse 67   Temp 97.9 F (36.6 C) (Oral)   Ht 5\' 7"  (1.702 m)   Wt 271 lb 6 oz (123.1 kg)   SpO2 96%   BMI 42.50 kg/m  General: Awake, alert, appears stated age HEENT: AT, [place], ears patent b/l and TM's neg, +TTP over max sinuses b/l, nares patent w/o discharge, pharynx pink and without exudates, MMM Neck: No masses or asymmetry Heart: RRR Lungs: CTAB, no accessory muscle use Psych: Age appropriate judgment and insight, normal mood and affect  Acute maxillary sinusitis, recurrence not specified - Plan: doxycycline (VIBRA-TABS) 100 MG tablet  Orders as above. Pocket rx given.  Continue to push fluids, practice good hand hygiene,  cover mouth when coughing. F/u prn. If starting to experience fevers, shaking, or shortness of breath, seek immediate care. Pt voiced understanding and agreement to the plan.  [place], DO 07/16/17 9:32 AM

## 2017-07-16 NOTE — Progress Notes (Signed)
Pre visit review using our clinic review tool, if applicable. No additional management support is needed unless otherwise documented below in the visit note. 

## 2017-07-19 DIAGNOSIS — M1712 Unilateral primary osteoarthritis, left knee: Secondary | ICD-10-CM | POA: Diagnosis not present

## 2017-07-29 ENCOUNTER — Other Ambulatory Visit: Payer: Self-pay | Admitting: Family

## 2017-07-30 ENCOUNTER — Telehealth: Payer: Self-pay | Admitting: *Deleted

## 2017-07-30 ENCOUNTER — Encounter: Payer: Self-pay | Admitting: Medical Oncology

## 2017-07-30 DIAGNOSIS — Z170 Estrogen receptor positive status [ER+]: Principal | ICD-10-CM

## 2017-07-30 DIAGNOSIS — C50412 Malignant neoplasm of upper-outer quadrant of left female breast: Secondary | ICD-10-CM

## 2017-07-30 NOTE — Telephone Encounter (Signed)
Received Medical/Surgical Clearance Form from [facility], Sx scheduled for 10/08/17; forwarded to provider/SLS 03/11

## 2017-07-30 NOTE — Progress Notes (Signed)
[name]: 6- month follow up call I spoke with patient today for her 6 month on-study follow-up call, since completion of palbociclib. Patient reports to be doing well and reports her grade 1 fatigue ended approximately 3 months ago. Patient reports no new side effects or adverse events and states she is "feeling great". Patient states that she is scheduled for knee surgery in May, and confirms this is not new, issue started before breast cancer diagnosis, was not a priority until now. Patient denies having any questions at this time. Patient thanked for her time and continued support of study and was encouraged to call Dr. Lindi Adie or myself with any questions or concerns she may have.  Patient continues with anti-hormone therapy. Bilateral mammogram was February 2019, no findings.  Adele Dan, RN, BSN Clinical Research 07/30/2017 1:43 PM

## 2017-08-07 NOTE — Telephone Encounter (Signed)
Per verbal from PCP, pt will need OV for surgical clearance. Left detailed message on voicemail to call and schedule appt. Form placed in blue folder on CMA desk until pt comes in for visit.

## 2017-08-15 NOTE — Telephone Encounter (Signed)
Pt has not called back to schedule appt yet. Sent mychart message to pt.

## 2017-08-20 ENCOUNTER — Ambulatory Visit (INDEPENDENT_AMBULATORY_CARE_PROVIDER_SITE_OTHER): Payer: 59 | Admitting: Family

## 2017-08-20 VITALS — BP 153/92 | HR 65 | Temp 98.5°F | Resp 16 | Ht 67.0 in | Wt 275.0 lb

## 2017-08-20 DIAGNOSIS — I10 Essential (primary) hypertension: Secondary | ICD-10-CM

## 2017-08-20 DIAGNOSIS — M797 Fibromyalgia: Secondary | ICD-10-CM

## 2017-08-20 DIAGNOSIS — F32A Depression, unspecified: Secondary | ICD-10-CM

## 2017-08-20 DIAGNOSIS — G4733 Obstructive sleep apnea (adult) (pediatric): Secondary | ICD-10-CM | POA: Diagnosis not present

## 2017-08-20 DIAGNOSIS — F329 Major depressive disorder, single episode, unspecified: Secondary | ICD-10-CM

## 2017-08-20 MED ORDER — ATORVASTATIN CALCIUM 20 MG PO TABS: 20.0000 mg | ORAL_TABLET | Freq: Every day | ORAL | 1 refills | Status: DC

## 2017-08-20 MED ORDER — AMLODIPINE BESYLATE 5 MG PO TABS: 5.0000 mg | ORAL_TABLET | Freq: Every day | ORAL | 1 refills | Status: DC

## 2017-08-20 NOTE — Progress Notes (Signed)
Subjective:    Patient ID: Michelle Kennedy, female    DOB: [birth year], 62 y.o.   MRN: [id number]  HPI  Patient is 62 yr old female who presents today for preoperative evaluation/clearance for her upcoming TKA 10/08/17.  HTN- BP: (!) 153/92   She is not currently on an antihypertensive. BP Readings from Last 3 Encounters:  08/20/17 (!) 153/92  07/16/17 (!) 142/82  03/20/17 132/82   FM-reports symptoms are stable on fibromyalgia.    Depression- reports mood is stable.  Reports that she is still struggling with leaving the house, ever since she was diagnosed with cancer. Feels anxious when she is out of the house, returns home and feels fine at home.   OSA- using cpap.    Lab Results  Component Value Date   HGBA1C 5.7 (H) 01/23/2017   No asthma symptoms.  Review of Systems See HPI  Past Medical History:  Diagnosis Date  . Abnormally small mouth   . Achilles tendon disorder, right   . Anemia   . Anxiety   . Arthritis    hips and knees  . Asthma    daily inhaler, prn inhaler and neb.  . Asthma due to environmental allergies   . Breast cancer ([place]) 01/2014   left  . Cancer ([place])   . Chest pain   . COPD (chronic obstructive pulmonary disease) ([place])   . Dental bridge present    upper front and lower right  . Dental crowns present    x 3  . Depression   . Family history of anesthesia complication    twin brother aspirated and died on OR table, per pt.  . Fibromyalgia   . Gallbladder disease   . GERD (gastroesophageal reflux disease)   . H/O blood clots   . History of gastric ulcer    as a teenager  . History of seizure age 29   as a reaction to Penicillin - no seizures since  . History of stomach ulcers   . History of thyroid cancer    s/p thyroidectomy  . Hyperlipidemia   . Hypothyroidism   . Joint pain   . Left knee injury   . Liver disorder   . Lymphedema    left arm  . Menopause   . Migraines   . Multiple food allergies   . Obesity   . OSA  (obstructive sleep apnea) 02/16/2016  . Palpitations   . Personal history of chemotherapy 2015  . Personal history of radiation therapy 2015   Left  . Prediabetes   . Rheumatoid arthritis ([place])   . SOB (shortness of breath)   . Swelling of both ankles   . Tinnitus   . UTI (lower urinary tract infection) 02/17/2014  . Vitamin D deficiency      Social History   Socioeconomic History  . Marital status: Married    Spouse name: Not on file  . Number of children: Not on file  . Years of education: Not on file  . Highest education level: Not on file  Occupational History  . 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 Financial resource strain: Not on file  . Food insecurity:    Worry: Not on file    Inability: Not on file  . Transportation needs:    Medical: Not on file    Non-medical: Not on file  Tobacco Use  . Smoking status: Never Smoker  . Smokeless tobacco: Never Used  Substance and Sexual Activity  . Alcohol use: No    Alcohol/week: 0.0 oz  . Drug use: No  . Sexual activity: Not Currently    Partners: Male    Comment: menarche age 51, P 2, first birth age 25, menopause age 2, Premarin x 10 yrs  Lifestyle  . Physical activity:    Days per week: Not on file    Minutes per session: Not on file  . Stress: Not on file  Relationships  . Social connections:    Talks on phone: Not on file    Gets together: Not on file    Attends religious service: Not on file    Active member of club or organization: Not on file    Attends meetings of clubs or organizations: Not on file    Relationship status: Not on file  . Intimate partner violence:    Fear of current or ex partner: Not on file    Emotionally abused: Not on file    Physically abused: Not on file    Forced sexual activity: Not on file  Other Topics Concern  . Not on file  Social History Narrative   Regular exercise: yes       Past Surgical History:  Procedure Laterality Date  .  ABDOMINAL HYSTERECTOMY  2004   complete  . ACHILLES TENDON REPAIR Right   . APPENDECTOMY  2004  . BREAST EXCISIONAL BIOPSY    . BREAST LUMPECTOMY Left    2015  . BREAST LUMPECTOMY WITH NEEDLE LOCALIZATION AND AXILLARY SENTINEL LYMPH NODE BX Left 02/23/2014   Procedure: BREAST LUMPECTOMY WITH NEEDLE LOCALIZATION AND AXILLARY SENTINEL LYMPH NODE BIOPSY;  Surgeon: Excell Seltzer, MD;  Location: [facility];  Service: General;  Laterality: Left;  . BREAST SURGERY  2011   left breast biposy  . CHOLECYSTECTOMY  1990  . COLONOSCOPY W/ POLYPECTOMY  06/2009  . EYE SURGERY Right 2013   exc. warts from underneath eyelid  . INCONTINENCE SURGERY  2004  . KNEE ARTHROSCOPY Bilateral    x 6 each knee  . LIGAMENT REPAIR Right    thumb/wrist  . ORIF TOE FRACTURE Right    great toe  . PORTACATH PLACEMENT N/A 03/24/2014   Procedure: INSERTION PORT-A-CATH;  Surgeon: Excell Seltzer, MD;  Location: [facility];  Service: General;  Laterality: N/A;  . THYROIDECTOMY  2000  . TONSILLECTOMY AND ADENOIDECTOMY  2000  . TUMOR EXCISION     from thoracic spine    Family History  Problem Relation Age of Onset  . Alcohol abuse Mother   . Arthritis Mother   . Hypertension Mother   . Bipolar disorder Mother   . Breast cancer Mother 75       unconfirmed  . Lung cancer Mother 67       smoker  . Hyperlipidemia Mother   . Stroke Mother   . Cancer Mother   . Depression Mother   . Anxiety disorder Mother   . Alcoholism Mother   . Drug abuse Mother   . Eating disorder Mother   . Obesity Mother   . Alcohol abuse Father   . Cancer Father        lung  . Hyperlipidemia Father   . Kidney disease Father   . Diabetes Father   . Hypertension Father   . Cystic kidney disease Father   . Thyroid disease Father   . Liver disease Father   . Alcoholism Father   . Arthritis  Maternal Grandmother   . Diabetes Paternal Grandmother   . Thyroid cancer Sister 26       type?; currently  89  . Other Sister        ovarian tumor @ 80; TAH/BSO  . Breast cancer Sister   . Thyroid cancer Brother        dx 37s; currently 79  . Breast cancer Maternal Aunt        dx 66s; deceased 10  . Thyroid cancer Paternal Aunt        All 3 paternal aunts with thyroid ca in 30s/40s  . Lung cancer Paternal Aunt        2 of 3 paternal aunts with lung cancer    Allergies  Allergen Reactions  . Bee Venom Anaphylaxis  . Contrast Media [Iodinated Diagnostic Agents] Shortness Of Breath  . Iodine Other (See Comments)    CARDIAC ARREST  . Latex Anaphylaxis and Rash  . Penicillins Shortness Of Breath, Rash and Other (See Comments)    SEIZURE  . Shellfish Allergy Shortness Of Breath  . Aspirin Rash and Other (See Comments)    GI UPSET  . Erythromycin Swelling and Rash    SWELLING OF JOINTS  . Lidocaine Swelling    SWELLING OF MOUTH AND THROAT  . Symbicort [Budesonide-Formoterol Fumarate] Other (See Comments)    BURNING OF TONGUE AND LIPS  . Codeine Rash  . Compazine [Prochlorperazine Maleate] Rash    Rash on face,chest, arms, back  . Pentazocine Lactate Rash  . Sulfonamide Derivatives Rash    Current Outpatient Medications on File Prior to Visit  Medication Sig Dispense Refill  . albuterol (PROAIR HFA) 108 (90 Base) MCG/ACT inhaler INHALE 2 PUFFS INTO THE LUNGS EVERY 6 HOURS AS NEEDED 3 Inhaler 0  . albuterol (PROVENTIL) (5 MG/ML) 0.5% nebulizer solution Take 2.5 mg by nebulization every 6 (six) hours as needed for wheezing or shortness of breath.    . anastrozole (ARIMIDEX) 1 MG tablet TAKE 1 TABLET BY MOUTH  DAILY 90 tablet 3  . Calcium Carbonate (CALCIUM 500 PO) Take 1 tablet by mouth daily.     . diphenhydrAMINE (BENADRYL) 25 MG tablet Take 1 tablet an hour before MRI.  Then take 1 tablet at bedtime as needed, per your usual regimen. 30 tablet 0  . doxycycline (VIBRA-TABS) 100 MG tablet Take 1 tablet (100 mg total) by mouth 2 (two) times daily. 20 tablet 0  . DULoxetine (CYMBALTA)  60 MG capsule TAKE 1 CAPSULE BY MOUTH  DAILY 90 capsule 1  . EPINEPHrine 0.3 mg/0.3 mL IJ SOAJ injection Inject 0.3 mLs (0.3 mg total) into the muscle once. 2 Device 0  . Ergocalciferol (VITAMIN D2) 400 UNITS TABS Take 1 tablet by mouth daily.    . Fluticasone-Salmeterol (ADVAIR DISKUS) 250-50 MCG/DOSE AEPB Inhale 1 puff into the lungs 2 (two) times daily. 180 each 1  . levothyroxine (SYNTHROID, LEVOTHROID) 150 MCG tablet Take 1 tablet (150 mcg total) by mouth daily. 90 tablet 3  . meloxicam (MOBIC) 7.5 MG tablet TAKE 1 TABLET BY MOUTH  DAILY 30 tablet 0  . Multiple Vitamin (MULTIVITAMIN) tablet Take 1 tablet by mouth daily.      Marland Kitchen MYRBETRIQ 50 MG TB24 tablet TAKE 1 TABLET BY MOUTH  DAILY 90 tablet 0  . nitroGLYCERIN (NITROSTAT) 0.4 MG SL tablet Place 1 tablet (0.4 mg total) under the tongue every 5 (five) minutes as needed for chest pain. 30 tablet 0  . Omega-3 Fatty Acids (FISH  OIL) 1200 MG CAPS Take 1 capsule by mouth daily.      . simvastatin (ZOCOR) 40 MG tablet TAKE 1 TABLET BY MOUTH  DAILY 30 tablet 0  . topiramate (TOPAMAX) 50 MG tablet Take 1 tablet (50 mg total) by mouth daily. 30 tablet 0  . traMADol (ULTRAM) 50 MG tablet Take 50 mg by mouth every 6 (six) hours as needed.    . Vitamin D, Ergocalciferol, (DRISDOL) 50000 units CAPS capsule Take 1 capsule (50,000 Units total) by mouth every 7 (seven) days. 4 capsule 0   No current facility-administered medications on file prior to visit.     BP (!) 153/92 (BP Location: Right Arm, Patient Position: Sitting, Cuff Size: Large)   Pulse 65   Temp 98.5 F (36.9 C) (Oral)   Resp 16   Ht 5\' 7"  (1.702 m)   Wt 275 lb (124.7 kg)   SpO2 97%   BMI 43.07 kg/m       Objective:   Physical Exam  Constitutional: She is oriented to person, place, and time. She appears well-developed and well-nourished.  HENT:  Head: Normocephalic and atraumatic.  Cardiovascular: Normal rate, regular rhythm and normal heart sounds.  No murmur  heard. Pulmonary/Chest: Effort normal and breath sounds normal. No respiratory distress. She has no wheezes.  Musculoskeletal: She exhibits no edema.  Neurological: She is alert and oriented to person, place, and time.  Skin: Skin is warm and dry.  Psychiatric: She has a normal mood and affect. Her behavior is normal. Judgment and thought content normal.          Assessment & Plan:  OSA- clinically stable on cpap, continue same.  HTN- uncontrolled. Add amlodipine 5mg .  Hyperlipidemia- d/c simvastatin start atorvastatin due to drug interaction with amlodipine.  Depression- mood stable but has some agorophobia symptoms. Refer to counselor. Continue cymbalta.  Fibromyalgia- stable on cymbalta, continue same.   Will plan surgical clearance next visit closer to her scheduled surgery.

## 2017-08-20 NOTE — Patient Instructions (Addendum)
Please schedule an appointment with a counselor [phone] Stop simvastatin, start atorvastatin. Start amlodipine for blood pressure.

## 2017-08-30 ENCOUNTER — Ambulatory Visit: Payer: Self-pay | Admitting: Orthopedic Surgery

## 2017-09-10 ENCOUNTER — Ambulatory Visit (INDEPENDENT_AMBULATORY_CARE_PROVIDER_SITE_OTHER): Payer: 59 | Admitting: Family

## 2017-09-10 ENCOUNTER — Ambulatory Visit (HOSPITAL_BASED_OUTPATIENT_CLINIC_OR_DEPARTMENT_OTHER)
Admission: RE | Admit: 2017-09-10 | Discharge: 2017-09-10 | Disposition: A | Discharge status: Home / Self Care | Payer: 59 | Source: Ambulatory Visit | Attending: Family | Admitting: Family

## 2017-09-10 ENCOUNTER — Encounter: Payer: Self-pay | Admitting: Family

## 2017-09-10 VITALS — BP 132/92 | HR 81 | Temp 98.4°F | Resp 18 | Ht 67.0 in | Wt 277.0 lb

## 2017-09-10 DIAGNOSIS — L989 Disorder of the skin and subcutaneous tissue, unspecified: Secondary | ICD-10-CM

## 2017-09-10 DIAGNOSIS — Z01818 Encounter for other preprocedural examination: Secondary | ICD-10-CM

## 2017-09-10 DIAGNOSIS — I10 Essential (primary) hypertension: Secondary | ICD-10-CM

## 2017-09-10 DIAGNOSIS — E785 Hyperlipidemia, unspecified: Secondary | ICD-10-CM | POA: Diagnosis not present

## 2017-09-10 LAB — CBC WITH DIFFERENTIAL/PLATELET
BASOS PCT: 0.8 % (ref 0.0–3.0)
Basophils Absolute: 0.1 10*3/uL (ref 0.0–0.1)
EOS PCT: 2.2 % (ref 0.0–5.0)
Eosinophils Absolute: 0.1 10*3/uL (ref 0.0–0.7)
HCT: 42 % (ref 36.0–46.0)
Hemoglobin: 13.8 g/dL (ref 12.0–15.0)
LYMPHS PCT: 19.4 % (ref 12.0–46.0)
Lymphs Abs: 1.3 10*3/uL (ref 0.7–4.0)
MCHC: 32.9 g/dL (ref 30.0–36.0)
MCV: 78.4 fl (ref 78.0–100.0)
Monocytes Absolute: 0.5 10*3/uL (ref 0.1–1.0)
Monocytes Relative: 8 % (ref 3.0–12.0)
NEUTROS ABS: 4.6 10*3/uL (ref 1.4–7.7)
Neutrophils Relative %: 69.6 % (ref 43.0–77.0)
PLATELETS: 278 10*3/uL (ref 150.0–400.0)
RBC: 5.36 Mil/uL — ABNORMAL HIGH (ref 3.87–5.11)
RDW: 17.3 % — AB (ref 11.5–15.5)
WBC: 6.6 10*3/uL (ref 4.0–10.5)

## 2017-09-10 LAB — URINALYSIS, ROUTINE W REFLEX MICROSCOPIC
Bilirubin Urine: NEGATIVE
Hgb urine dipstick: NEGATIVE
LEUKOCYTES UA: NEGATIVE
NITRITE: NEGATIVE
PH: 5.5 (ref 5.0–8.0)
RBC / HPF: NONE SEEN (ref 0–?)
SPECIFIC GRAVITY, URINE: 1.025 (ref 1.000–1.030)
Total Protein, Urine: NEGATIVE
Urine Glucose: NEGATIVE
Urobilinogen, UA: 0.2 (ref 0.0–1.0)

## 2017-09-10 LAB — BASIC METABOLIC PANEL
BUN: 18 mg/dL (ref 6–23)
CALCIUM: 9.5 mg/dL (ref 8.4–10.5)
CHLORIDE: 104 meq/L (ref 96–112)
CO2: 30 meq/L (ref 19–32)
Creatinine, Ser: 0.83 mg/dL (ref 0.40–1.20)
GFR: 73.74 mL/min (ref >60.00)
GLUCOSE: 95 mg/dL (ref 70–99)
POTASSIUM: 4.2 meq/L (ref 3.5–5.1)
SODIUM: 140 meq/L (ref 135–145)

## 2017-09-10 LAB — HEPATIC FUNCTION PANEL
ALBUMIN: 3.9 g/dL (ref 3.5–5.2)
ALT: 25 U/L (ref 0–35)
AST: 19 U/L (ref 0–37)
Alkaline Phosphatase: 58 U/L (ref 39–117)
Bilirubin, Direct: 0.1 mg/dL (ref 0.0–0.3)
TOTAL PROTEIN: 6.4 g/dL (ref 6.0–8.3)
Total Bilirubin: 0.4 mg/dL (ref 0.2–1.2)

## 2017-09-10 LAB — PROTIME-INR
INR: 0.9 ratio (ref 0.8–1.0)
PROTHROMBIN TIME: 10 s (ref 9.6–13.1)

## 2017-09-10 MED ORDER — ATORVASTATIN CALCIUM 20 MG PO TABS: 20.0000 mg | ORAL_TABLET | Freq: Every day | ORAL | 1 refills | Status: DC

## 2017-09-10 MED ORDER — AMLODIPINE BESYLATE 5 MG PO TABS: 5.0000 mg | ORAL_TABLET | Freq: Every day | ORAL | 1 refills | Status: DC

## 2017-09-10 NOTE — Progress Notes (Addendum)
Subjective:    Patient ID: Michelle Kennedy, female    DOB: [birth year], 62 y.o.   MRN: [id number]  HPI  Patient is a 62 year old female who presents today for follow-up   And for surgical clearance. Hypertension-last visit blood pressure was noted to be elevated and we added amlodipine 5 mg once daily to her regimen. BP Readings from Last 3 Encounters:  09/10/17 (!) 132/92  08/20/17 (!) 153/92  07/16/17 (!) 142/82   Home bp readings: 131/79, 135/77, 134/87  Hyperlipidemia-last visit we stopped simvastatin due to potential drug interaction with amlodipine.  She was placed instead on atorvastatin which she reports to be tolerating without difficulty.  She is currently scheduled for a total knee arthroplasty on 10/08/2017.  She is scheduled for preoperative labs.  Will be performed by Dr. Maureen Ralphs.    Review of Systems See HPI  Past Medical History:  Diagnosis Date  . Abnormally small mouth   . Achilles tendon disorder, right   . Anemia   . Anxiety   . Arthritis    hips and knees  . Asthma    daily inhaler, prn inhaler and neb.  . Asthma due to environmental allergies   . Breast cancer ([place]) 01/2014   left  . Cancer ([place])   . Chest pain   . COPD (chronic obstructive pulmonary disease) ([place])   . Dental bridge present    upper front and lower right  . Dental crowns present    x 3  . Depression   . Family history of anesthesia complication    twin brother aspirated and died on OR table, per pt.  . Fibromyalgia   . Gallbladder disease   . GERD (gastroesophageal reflux disease)   . H/O blood clots   . History of gastric ulcer    as a teenager  . History of seizure age 40   as a reaction to Penicillin - no seizures since  . History of stomach ulcers   . History of thyroid cancer    s/p thyroidectomy  . Hyperlipidemia   . Hypothyroidism   . Joint pain   . Left knee injury   . Liver disorder   . Lymphedema    left arm  . Menopause   . Migraines   . Multiple food  allergies   . Obesity   . OSA (obstructive sleep apnea) 02/16/2016  . Palpitations   . Personal history of chemotherapy 2015  . Personal history of radiation therapy 2015   Left  . Prediabetes   . Rheumatoid arthritis ([place])   . SOB (shortness of breath)   . Swelling of both ankles   . Tinnitus   . UTI (lower urinary tract infection) 02/17/2014  . Vitamin D deficiency      Social History   Socioeconomic History  . Marital status: Married    Spouse name: Not on file  . Number of children: Not on file  . Years of education: Not on file  . Highest education level: Not on file  Occupational History  . 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 Financial resource strain: Not on file  . Food insecurity:    Worry: Not on file    Inability: Not on file  . Transportation needs:    Medical: Not on file    Non-medical: Not on file  Tobacco Use  . Smoking status: Never Smoker  . Smokeless tobacco: Never Used  Substance and Sexual Activity  .  Alcohol use: No    Alcohol/week: 0.0 oz  . Drug use: No  . Sexual activity: Not Currently    Partners: Male    Comment: menarche age 45, P 2, first birth age 45, menopause age 52, Premarin x 10 yrs  Lifestyle  . Physical activity:    Days per week: Not on file    Minutes per session: Not on file  . Stress: Not on file  Relationships  . Social connections:    Talks on phone: Not on file    Gets together: Not on file    Attends religious service: Not on file    Active member of club or organization: Not on file    Attends meetings of clubs or organizations: Not on file    Relationship status: Not on file  . Intimate partner violence:    Fear of current or ex partner: Not on file    Emotionally abused: Not on file    Physically abused: Not on file    Forced sexual activity: Not on file  Other Topics Concern  . Not on file  Social History Narrative   Regular exercise: yes       Past Surgical History:    Procedure Laterality Date  . ABDOMINAL HYSTERECTOMY  2004   complete  . ACHILLES TENDON REPAIR Right   . APPENDECTOMY  2004  . BREAST EXCISIONAL BIOPSY    . BREAST LUMPECTOMY Left    2015  . BREAST LUMPECTOMY WITH NEEDLE LOCALIZATION AND AXILLARY SENTINEL LYMPH NODE BX Left 02/23/2014   Procedure: BREAST LUMPECTOMY WITH NEEDLE LOCALIZATION AND AXILLARY SENTINEL LYMPH NODE BIOPSY;  Surgeon: Excell Seltzer, MD;  Location: [facility];  Service: General;  Laterality: Left;  . BREAST SURGERY  2011   left breast biposy  . CHOLECYSTECTOMY  1990  . COLONOSCOPY W/ POLYPECTOMY  06/2009  . EYE SURGERY Right 2013   exc. warts from underneath eyelid  . INCONTINENCE SURGERY  2004  . KNEE ARTHROSCOPY Bilateral    x 6 each knee  . LIGAMENT REPAIR Right    thumb/wrist  . ORIF TOE FRACTURE Right    great toe  . PORTACATH PLACEMENT N/A 03/24/2014   Procedure: INSERTION PORT-A-CATH;  Surgeon: Excell Seltzer, MD;  Location: [facility];  Service: General;  Laterality: N/A;  . THYROIDECTOMY  2000  . TONSILLECTOMY AND ADENOIDECTOMY  2000  . TUMOR EXCISION     from thoracic spine    Family History  Problem Relation Age of Onset  . Alcohol abuse Mother   . Arthritis Mother   . Hypertension Mother   . Bipolar disorder Mother   . Breast cancer Mother 31       unconfirmed  . Lung cancer Mother 32       smoker  . Hyperlipidemia Mother   . Stroke Mother   . Cancer Mother   . Depression Mother   . Anxiety disorder Mother   . Alcoholism Mother   . Drug abuse Mother   . Eating disorder Mother   . Obesity Mother   . Alcohol abuse Father   . Cancer Father        lung  . Hyperlipidemia Father   . Kidney disease Father   . Diabetes Father   . Hypertension Father   . Cystic kidney disease Father   . Thyroid disease Father   . Liver disease Father   . Alcoholism Father   . Arthritis Maternal Grandmother   .  Diabetes Paternal Grandmother   . Thyroid cancer  Sister 6       type?; currently 59  . Other Sister        ovarian tumor @ 56; TAH/BSO  . Breast cancer Sister   . Thyroid cancer Brother        dx 17s; currently 26  . Breast cancer Maternal Aunt        dx 44s; deceased 36  . Thyroid cancer Paternal Aunt        All 3 paternal aunts with thyroid ca in 30s/40s  . Lung cancer Paternal Aunt        2 of 3 paternal aunts with lung cancer    Allergies  Allergen Reactions  . Bee Venom Anaphylaxis  . Contrast Media [Iodinated Diagnostic Agents] Shortness Of Breath  . Iodine Other (See Comments)    CARDIAC ARREST  . Latex Anaphylaxis and Rash  . Penicillins Shortness Of Breath, Rash and Other (See Comments)    SEIZURE  . Shellfish Allergy Shortness Of Breath  . Aspirin Rash and Other (See Comments)    GI UPSET  . Erythromycin Swelling and Rash    SWELLING OF JOINTS  . Lidocaine Swelling    SWELLING OF MOUTH AND THROAT  . Symbicort [Budesonide-Formoterol Fumarate] Other (See Comments)    BURNING OF TONGUE AND LIPS  . Codeine Rash  . Compazine [Prochlorperazine Maleate] Rash    Rash on face,chest, arms, back  . Pentazocine Lactate Rash  . Sulfonamide Derivatives Rash    Current Outpatient Medications on File Prior to Visit  Medication Sig Dispense Refill  . albuterol (PROAIR HFA) 108 (90 Base) MCG/ACT inhaler INHALE 2 PUFFS INTO THE LUNGS EVERY 6 HOURS AS NEEDED 3 Inhaler 0  . albuterol (PROVENTIL) (5 MG/ML) 0.5% nebulizer solution Take 2.5 mg by nebulization every 6 (six) hours as needed for wheezing or shortness of breath.    Marland Kitchen amLODipine (NORVASC) 5 MG tablet Take 1 tablet (5 mg total) by mouth daily. 30 tablet 1  . anastrozole (ARIMIDEX) 1 MG tablet TAKE 1 TABLET BY MOUTH  DAILY 90 tablet 3  . atorvastatin (LIPITOR) 20 MG tablet Take 1 tablet (20 mg total) by mouth daily. 30 tablet 1  . Calcium Carbonate (CALCIUM 500 PO) Take 1 tablet by mouth daily.     . diphenhydrAMINE (BENADRYL) 25 MG tablet Take 1 tablet an hour before  MRI.  Then take 1 tablet at bedtime as needed, per your usual regimen. 30 tablet 0  . doxycycline (VIBRA-TABS) 100 MG tablet Take 1 tablet (100 mg total) by mouth 2 (two) times daily. 20 tablet 0  . DULoxetine (CYMBALTA) 60 MG capsule TAKE 1 CAPSULE BY MOUTH  DAILY 90 capsule 1  . EPINEPHrine 0.3 mg/0.3 mL IJ SOAJ injection Inject 0.3 mLs (0.3 mg total) into the muscle once. 2 Device 0  . Ergocalciferol (VITAMIN D2) 400 UNITS TABS Take 1 tablet by mouth daily.    . Fluticasone-Salmeterol (ADVAIR DISKUS) 250-50 MCG/DOSE AEPB Inhale 1 puff into the lungs 2 (two) times daily. (Patient taking differently: Inhale 1 puff into the lungs daily. ) 180 each 1  . levothyroxine (SYNTHROID, LEVOTHROID) 150 MCG tablet Take 1 tablet (150 mcg total) by mouth daily. 90 tablet 3  . meloxicam (MOBIC) 7.5 MG tablet TAKE 1 TABLET BY MOUTH  DAILY 30 tablet 0  . Multiple Vitamin (MULTIVITAMIN) tablet Take 1 tablet by mouth daily.      Marland Kitchen MYRBETRIQ 50 MG TB24 tablet TAKE  1 TABLET BY MOUTH  DAILY 90 tablet 0  . nitroGLYCERIN (NITROSTAT) 0.4 MG SL tablet Place 1 tablet (0.4 mg total) under the tongue every 5 (five) minutes as needed for chest pain. 30 tablet 0  . Omega-3 Fatty Acids (FISH OIL) 1200 MG CAPS Take 1 capsule by mouth daily.      Marland Kitchen topiramate (TOPAMAX) 50 MG tablet Take 1 tablet (50 mg total) by mouth daily. 30 tablet 0  . traMADol (ULTRAM) 50 MG tablet Take 50 mg by mouth every 6 (six) hours as needed.     No current facility-administered medications on file prior to visit.     BP (!) 132/92 (BP Location: Right Arm, Patient Position: Sitting, Cuff Size: Large)   Pulse 81   Temp 98.4 F (36.9 C)   Resp 18   Ht 5\' 7"  (1.702 m)   Wt 277 lb (125.6 kg)   SpO2 96%   BMI 43.38 kg/m       Objective:   Physical Exam  Constitutional: She is oriented to person, place, and time. She appears well-developed and well-nourished.  HENT:  Head: Normocephalic and atraumatic.  Right Ear: Tympanic membrane and ear  canal normal.  Left Ear: Tympanic membrane and ear canal normal.  Mouth/Throat: No oropharyngeal exudate, posterior oropharyngeal edema or posterior oropharyngeal erythema.  Cardiovascular: Normal rate, regular rhythm and normal heart sounds.  No murmur heard. Pulmonary/Chest: Effort normal and breath sounds normal. No respiratory distress. She has no wheezes.  Musculoskeletal: She exhibits no edema.  Lymphadenopathy:    She has no cervical adenopathy.  Neurological: She is alert and oriented to person, place, and time.  Skin: Skin is warm and dry.  Skin lesion on right cheek  Psychiatric: She has a normal mood and affect. Her behavior is normal. Judgment and thought content normal.          Assessment & Plan:  Skin lesion-  Right cheek- will refer to dermatology.  Pre-operative evaluation- check CMET, cbc, PT/INR, UA/culture, CXR.  EKG to today: EKG tracing is personally reviewed.  EKG notes NSR.  No acute changes.   HTN- BP is improved.  Continue current dose of amlodipine.  Hyperlipidemia- tolerating lipitor. Plan lipid panel next visit.   Addendum:  Reviewed lab work and chest x-ray. All stable.  Pt medically cleared to proceed with planned surgery.

## 2017-09-10 NOTE — Patient Instructions (Signed)
Please complete lab work prior to leaving. Complete chest x-ray on the first floor.  You should be contacted about your referral to the dermatologist to evaluate the spot on your right cheek. Let us know if you have not heard back about this appointment in 1 week.

## 2017-09-11 ENCOUNTER — Telehealth: Payer: Self-pay | Admitting: *Deleted

## 2017-09-11 LAB — URINE CULTURE
MICRO NUMBER: 90489361
RESULT: NO GROWTH
SPECIMEN QUALITY: ADEQUATE

## 2017-09-11 NOTE — Telephone Encounter (Signed)
LM with note below 

## 2017-09-11 NOTE — Telephone Encounter (Signed)
-----   Message from Gardenia Phlegm, NP sent at 09/11/2017  7:42 AM EDT ----- Please call patient with normal xray results ----- Message ----- From: Interface, Rad Results In Sent: 09/10/2017   5:05 PM To: Nicholas Lose, MD

## 2017-09-12 ENCOUNTER — Telehealth: Payer: Self-pay | Admitting: Family

## 2017-09-12 NOTE — Telephone Encounter (Signed)
Please send copy of my pre-op visit note, labs, ekg, cxr to Dr. Maureen Ralphs for clearance.

## 2017-09-12 NOTE — Telephone Encounter (Signed)
All information faxed to Dr. Wynelle Link at [facility] [place], 18 pages of information faxed to [phone]

## 2017-09-13 ENCOUNTER — Telehealth: Payer: Self-pay | Admitting: Hematology and Oncology

## 2017-09-13 NOTE — Telephone Encounter (Signed)
Patient called to cancel May

## 2017-09-17 ENCOUNTER — Ambulatory Visit (INDEPENDENT_AMBULATORY_CARE_PROVIDER_SITE_OTHER): Payer: 59 | Admitting: Internal Medicine

## 2017-09-17 ENCOUNTER — Encounter: Payer: Self-pay | Admitting: Internal Medicine

## 2017-09-17 VITALS — BP 126/84 | HR 83 | Ht 67.0 in | Wt 273.4 lb

## 2017-09-17 DIAGNOSIS — C73 Malignant neoplasm of thyroid gland: Secondary | ICD-10-CM | POA: Diagnosis not present

## 2017-09-17 DIAGNOSIS — E890 Postprocedural hypothyroidism: Secondary | ICD-10-CM | POA: Diagnosis not present

## 2017-09-17 LAB — TSH: TSH: 0.82 u[IU]/mL (ref 0.35–4.50)

## 2017-09-17 LAB — T4, FREE: FREE T4: 1.12 ng/dL (ref 0.60–1.60)

## 2017-09-17 NOTE — Progress Notes (Signed)
Patient ID: Michelle Kennedy, female   DOB: 08/06/1955, 62 y.o.   MRN: [id number]    HPI  Michelle Kennedy is a 62 y.o.-year-old female, returning for follow-up for h/o papillary thyroid cancer and postsurgical hypothyroidism. She saw Dr. William Smith before.  Last visit with me 6 months ago.  Reviewed and addended history: Pt. has been dx with ThyCA in 2002, and had total thyroidectomy then ([place]). No RAI tx 2/2 severe iodine allergy reportedly, no postop U/S's checked.  Neck U/S (12/2016): Complete thyroidectomy without significant abnormality by ultrasound  She is on Levothyroxine 150 mcg daily -last dose change was in 10/2016.  Pt takes the levothyroxine: - in am - fasting - With water - at least 30 min from b'fast - no Fe, MVI - Occasionally taking PPIs at night - + calcium at night - + MVI at night - not on Biotin  Reviewed patient's TFTs: Lab Results  Component Value Date   TSH 2.91 03/20/2017   TSH 0.84 12/12/2016   TSH 5.600 (H) 09/20/2016   TSH 0.17 (L) 07/08/2014   TSH 0.084 (L) 09/24/2013   TSH 1.740 04/05/2011   TSH 0.075 (L) 09/21/2010   TSH 0.765 12/21/2009   FREET4 0.87 03/20/2017   FREET4 0.90 12/12/2016   FREET4 1.16 09/20/2016   Prev. Very suppressed TSH levels per review of Care Everywhere records.  Thyroglobulin levels: Component     Latest Ref Rng & Units 12/12/2016  Thyroglobulin     ng/mL 0.1 (L)  Thyroglobulin Ab     <2 IU/mL <1  10/11/2015: Tg <0.1, ATA <1  Pt denies: - feeling nodules in neck - hoarseness - dysphagia - choking - SOB with lying down  She has + FH of thyroid disorders in: siblings: goiter. + FH of thyroid cancer: Paternal aunts and uncle. No history of radiation therapy to head or neck . Had RxTx for BrCA (diagnosed in 2016)-but not to the neck.  She was seeing Dr. Caren Beasley in [facility] >> stopped. Continues to walk on treadmill  20 min 3x a day. She has fibromyalgia. She will have a TKR in  09/2017.  ROS: Constitutional: + weight gain/no weight loss, no fatigue, + subjective hyperthermia, + subjective hypothermia Eyes: no blurry vision, no xerophthalmia ENT: no sore throat, + see HPI Cardiovascular: no CP/no SOB/no palpitations/+ leg swelling Respiratory: no cough/no SOB/no wheezing Gastrointestinal: no N/no V/no D/no C/no acid reflux Musculoskeletal: no muscle aches/no joint aches Skin: no rashes, no hair loss Neurological: no tremors/no numbness/no tingling/no dizziness  I reviewed pt's medications, allergies, PMH, social hx, family hx, and changes were documented in the history of present illness. Otherwise, unchanged from my initial visit note. Started Amlodipine and Lipitor.   Past Medical History:  Diagnosis Date  . Abnormally small mouth   . Achilles tendon disorder, right   . Anemia   . Anxiety   . Arthritis    hips and knees  . Asthma    daily inhaler, prn inhaler and neb.  . Asthma due to environmental allergies   . Breast cancer ([facility]) 01/2014   left  . Cancer ([facility])   . Chest pain   . COPD (chronic obstructive pulmonary disease) ([facility])   . Dental bridge present    upper front and lower right  . Dental crowns present    x 3  . Depression   . Family history of anesthesia complication    twin brother aspirated and died on   OR table, per pt.  . Fibromyalgia   . Gallbladder disease   . GERD (gastroesophageal reflux disease)   . H/O blood clots   . History of gastric ulcer    as a teenager  . History of seizure age 16   as a reaction to Penicillin - no seizures since  . History of stomach ulcers   . History of thyroid cancer    s/p thyroidectomy  . Hyperlipidemia   . Hypothyroidism   . Joint pain   . Left knee injury   . Liver disorder   . Lymphedema    left arm  . Menopause   . Migraines   . Multiple food allergies   . Obesity   . OSA (obstructive sleep apnea) 02/16/2016  . Palpitations   . Personal history of chemotherapy 2015  .  Personal history of radiation therapy 2015   Left  . Prediabetes   . Rheumatoid arthritis ([facility])   . SOB (shortness of breath)   . Swelling of both ankles   . Tinnitus   . UTI (lower urinary tract infection) 02/17/2014  . Vitamin D deficiency    Past Surgical History:  Procedure Laterality Date  . ABDOMINAL HYSTERECTOMY  2004   complete  . ACHILLES TENDON REPAIR Right   . APPENDECTOMY  2004  . BREAST EXCISIONAL BIOPSY    . BREAST LUMPECTOMY Left    2015  . BREAST LUMPECTOMY WITH NEEDLE LOCALIZATION AND AXILLARY SENTINEL LYMPH NODE BX Left 02/23/2014   Procedure: BREAST LUMPECTOMY WITH NEEDLE LOCALIZATION AND AXILLARY SENTINEL LYMPH NODE BIOPSY;  Surgeon: Benjamin Hoxworth, MD;  Location: [facility];  Service: General;  Laterality: Left;  . BREAST SURGERY  2011   left breast biposy  . CHOLECYSTECTOMY  1990  . COLONOSCOPY W/ POLYPECTOMY  06/2009  . EYE SURGERY Right 2013   exc. warts from underneath eyelid  . INCONTINENCE SURGERY  2004  . KNEE ARTHROSCOPY Bilateral    x 6 each knee  . LIGAMENT REPAIR Right    thumb/wrist  . ORIF TOE FRACTURE Right    great toe  . PORTACATH PLACEMENT N/A 03/24/2014   Procedure: INSERTION PORT-A-CATH;  Surgeon: Benjamin Hoxworth, MD;  Location: [facility];  Service: General;  Laterality: N/A;  . THYROIDECTOMY  2000  . TONSILLECTOMY AND ADENOIDECTOMY  2000  . TUMOR EXCISION     from thoracic spine   Social History   Social History  . Marital status: Married    Spouse name: N/A  . Number of children: 2   Occupational History  . 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 Smoking status: Never Smoker  . Smokeless tobacco: Never Used  . Alcohol use No  . Drug use: No   Social History Narrative   Regular exercise: yes   Current Outpatient Medications on File Prior to Visit  Medication Sig Dispense Refill  . albuterol (PROAIR HFA) 108 (90 Base) MCG/ACT inhaler INHALE 2 PUFFS INTO THE LUNGS  EVERY 6 HOURS AS NEEDED 3 Inhaler 0  . albuterol (PROVENTIL) (5 MG/ML) 0.5% nebulizer solution Take 2.5 mg by nebulization every 6 (six) hours as needed for wheezing or shortness of breath.    . amLODipine (NORVASC) 5 MG tablet Take 1 tablet (5 mg total) by mouth daily. 90 tablet 1  . anastrozole (ARIMIDEX) 1 MG tablet TAKE 1 TABLET BY MOUTH  DAILY 90 tablet 3  . atorvastatin (LIPITOR) 20 MG tablet Take 1 tablet (20   mg total) by mouth daily. 90 tablet 1  . Calcium Carbonate (CALCIUM 500 PO) Take 1 tablet by mouth daily.     . diphenhydrAMINE (BENADRYL) 25 MG tablet Take 1 tablet an hour before MRI.  Then take 1 tablet at bedtime as needed, per your usual regimen. 30 tablet 0  . doxycycline (VIBRA-TABS) 100 MG tablet Take 1 tablet (100 mg total) by mouth 2 (two) times daily. 20 tablet 0  . DULoxetine (CYMBALTA) 60 MG capsule TAKE 1 CAPSULE BY MOUTH  DAILY 90 capsule 1  . EPINEPHrine 0.3 mg/0.3 mL IJ SOAJ injection Inject 0.3 mLs (0.3 mg total) into the muscle once. 2 Device 0  . Ergocalciferol (VITAMIN D2) 400 UNITS TABS Take 1 tablet by mouth daily.    . Fluticasone-Salmeterol (ADVAIR DISKUS) 250-50 MCG/DOSE AEPB Inhale 1 puff into the lungs 2 (two) times daily. (Patient taking differently: Inhale 1 puff into the lungs daily. ) 180 each 1  . levothyroxine (SYNTHROID, LEVOTHROID) 150 MCG tablet Take 1 tablet (150 mcg total) by mouth daily. 90 tablet 3  . meloxicam (MOBIC) 7.5 MG tablet TAKE 1 TABLET BY MOUTH  DAILY 30 tablet 0  . Multiple Vitamin (MULTIVITAMIN) tablet Take 1 tablet by mouth daily.      . MYRBETRIQ 50 MG TB24 tablet TAKE 1 TABLET BY MOUTH  DAILY 90 tablet 0  . nitroGLYCERIN (NITROSTAT) 0.4 MG SL tablet Place 1 tablet (0.4 mg total) under the tongue every 5 (five) minutes as needed for chest pain. 30 tablet 0  . Omega-3 Fatty Acids (FISH OIL) 1200 MG CAPS Take 1 capsule by mouth daily.      . topiramate (TOPAMAX) 50 MG tablet Take 1 tablet (50 mg total) by mouth daily. 30 tablet 0  .  traMADol (ULTRAM) 50 MG tablet Take 50 mg by mouth every 6 (six) hours as needed.     No current facility-administered medications on file prior to visit.    Allergies  Allergen Reactions  . Bee Venom Anaphylaxis  . Contrast Media [Iodinated Diagnostic Agents] Shortness Of Breath  . Iodine Other (See Comments)    CARDIAC ARREST  . Latex Anaphylaxis and Rash  . Penicillins Shortness Of Breath, Rash and Other (See Comments)    SEIZURE  . Shellfish Allergy Shortness Of Breath  . Aspirin Rash and Other (See Comments)    GI UPSET  . Erythromycin Swelling and Rash    SWELLING OF JOINTS  . Lidocaine Swelling    SWELLING OF MOUTH AND THROAT  . Symbicort [Budesonide-Formoterol Fumarate] Other (See Comments)    BURNING OF TONGUE AND LIPS  . Codeine Rash  . Compazine [Prochlorperazine Maleate] Rash    Rash on face,chest, arms, back  . Pentazocine Lactate Rash  . Sulfonamide Derivatives Rash   Family History  Problem Relation Age of Onset  . Alcohol abuse Mother   . Arthritis Mother   . Hypertension Mother   . Bipolar disorder Mother   . Breast cancer Mother 36       unconfirmed  . Lung cancer Mother 60       smoker  . Hyperlipidemia Mother   . Stroke Mother   . Cancer Mother   . Depression Mother   . Anxiety disorder Mother   . Alcoholism Mother   . Drug abuse Mother   . Eating disorder Mother   . Obesity Mother   . Alcohol abuse Father   . Cancer Father        lung  .   Hyperlipidemia Father   . Kidney disease Father   . Diabetes Father   . Hypertension Father   . Cystic kidney disease Father   . Thyroid disease Father   . Liver disease Father   . Alcoholism Father   . Arthritis Maternal Grandmother   . Diabetes Paternal Grandmother   . Thyroid cancer Sister 71       type?; currently 30  . Other Sister        ovarian tumor @ 65; TAH/BSO  . Breast cancer Sister   . Thyroid cancer Brother        dx 43s; currently 56  . Breast cancer Maternal Aunt        dx 26s;  deceased 61  . Thyroid cancer Paternal Aunt        All 3 paternal aunts with thyroid ca in 30s/40s  . Lung cancer Paternal Aunt        2 of 3 paternal aunts with lung cancer    PE: BP 126/84   Pulse 83   Ht 5' 7" (1.702 m)   Wt 273 lb 6.4 oz (124 kg)   SpO2 95%   BMI 42.82 kg/m  Wt Readings from Last 3 Encounters:  09/17/17 273 lb 6.4 oz (124 kg)  09/10/17 277 lb (125.6 kg)  08/20/17 275 lb (124.7 kg)   Constitutional: overweight, in NAD Eyes: PERRLA, EOMI, no exophthalmos ENT: moist mucous membranes, no neck masses, healed thyroidectomy scar, no cervical lymphadenopathy Cardiovascular: RRR, No MRG Respiratory: CTA B Gastrointestinal: abdomen soft, NT, ND, BS+ Musculoskeletal: no deformities, strength intact in all 4 Skin: moist, warm, no rashes Neurological: no tremor with outstretched hands, DTR normal in all 4   ASSESSMENT: 1. H/O PTC  2. Hypothyroidism  PLAN:  1. PTC - I do not have records of the initial pathology ; she did not have RAI treatment because of allergy to iodine -She had previous thyroglobulin levels that were undetectable, as were her ATA antibodies, per records reviewed when she was seeing Dr. Tamala Julian.  Latest thyroglobulin level was detectable, but very low, at 0.1.  We discussed that this is not uncommon secondary to acid variability and also due to the fact that she did not have RAI treatment.  We will recheck Tg + ATA at this visit - we reviewed the latest neck ultrasound from 12/2016: No metastases or recurrences in the neck  2. Patient with  long-standing postsurgical hypothyroidism, on levothyroxine.  She has had suppressed TSH levels in the past, but latest levels normalized. - latest thyroid labs from 02/2017 reviewed with pt >> normal  - she continues on LT4 150 Mcg daily - pt feels good on this dose - we discussed about taking the thyroid hormone every day, with water, >30 minutes before breakfast, separated by >4 hours from acid reflux  medications, calcium, iron, multivitamins. Pt. is taking it correctly. - will check thyroid tests today: TSH and fT4 - If labs are abnormal, she will need to return for repeat TFTs in 1.5 months  - otherwise, I will see her back in a year  Component     Latest Ref Rng & Units 09/17/2017  Thyroglobulin     ng/mL 0.1 (L)  Comment        TSH     0.35 - 4.50 uIU/mL 0.82  T4,Free(Direct)     0.60 - 1.60 ng/dL 1.12  Thyroglobulin Ab     < or = 1 IU/mL <1   Thyroglobulin still  detectable, but very low, the rest of the labs are all normal. Cristina Gherghe, MD PhD [facility]   

## 2017-09-17 NOTE — Patient Instructions (Signed)
Please continue Levothyroxine 150 mcg daily.  Take the thyroid hormone every day, with water, at least 30 minutes before breakfast, separated by at least 4 hours from: - acid reflux medications - calcium - iron - multivitamins  Please come back for a follow-up appointment in 1 year.

## 2017-09-18 LAB — THYROGLOBULIN LEVEL: Thyroglobulin: 0.1 ng/mL — ABNORMAL LOW

## 2017-09-18 LAB — THYROGLOBULIN ANTIBODY: Thyroglobulin Ab: <1 IU/mL (ref <=1)

## 2017-09-24 ENCOUNTER — Encounter: Payer: Self-pay | Admitting: Internal Medicine

## 2017-09-25 ENCOUNTER — Ambulatory Visit: Payer: 59 | Admitting: Hematology and Oncology

## 2017-10-08 ENCOUNTER — Encounter: Payer: Self-pay | Admitting: Family

## 2017-10-08 MED ORDER — MELOXICAM 7.5 MG PO TABS: 7.5000 mg | ORAL_TABLET | Freq: Every day | ORAL | 1 refills | Status: DC

## 2017-10-08 NOTE — Telephone Encounter (Signed)
Michelle Kennedy-- pt is requesting a 90 day supply of Meloxicam to mail order.  Please advivse?

## 2017-10-09 MED ORDER — AMLODIPINE BESYLATE 5 MG PO TABS: 5.0000 mg | ORAL_TABLET | Freq: Every day | ORAL | 3 refills | Status: DC

## 2017-10-15 ENCOUNTER — Other Ambulatory Visit: Payer: Self-pay | Admitting: Family

## 2017-10-27 ENCOUNTER — Encounter: Payer: Self-pay | Admitting: Family

## 2017-11-09 ENCOUNTER — Ambulatory Visit: Payer: Self-pay

## 2017-11-09 ENCOUNTER — Encounter: Payer: Self-pay | Admitting: Family Medicine

## 2017-11-09 ENCOUNTER — Ambulatory Visit (INDEPENDENT_AMBULATORY_CARE_PROVIDER_SITE_OTHER): Payer: 59 | Admitting: Family Medicine

## 2017-11-09 VITALS — BP 132/84 | HR 67 | Temp 98.2°F | Ht 67.0 in | Wt 270.5 lb

## 2017-11-09 DIAGNOSIS — H8113 Benign paroxysmal vertigo, bilateral: Secondary | ICD-10-CM

## 2017-11-09 MED ORDER — MECLIZINE HCL 25 MG PO TABS: 25.0000 mg | ORAL_TABLET | Freq: Three times a day (TID) | ORAL | 0 refills | Status: DC | PRN

## 2017-11-09 MED ORDER — ONDANSETRON HCL 4 MG PO TABS: 4.0000 mg | ORAL_TABLET | Freq: Three times a day (TID) | ORAL | 0 refills | Status: DC | PRN

## 2017-11-09 NOTE — Progress Notes (Signed)
Pre visit review using our clinic review tool, if applicable. No additional management support is needed unless otherwise documented below in the visit note. 

## 2017-11-09 NOTE — Telephone Encounter (Signed)
Pt. Reports she started yesterday with Vertigo. States she had this 3 years ago when she had breast cancer and "medicine helped." Having some nausea as well. Appointment made for today. Reason for Disposition . [1] MODERATE dizziness (e.g., vertigo; feels very unsteady, interferes with normal activities) AND [2] has NOT been evaluated by physician for this  Answer Assessment - Initial Assessment Questions 1. DESCRIPTION: "Describe your dizziness."     Vertigo 2. VERTIGO: "Do you feel like either you or the room is spinning or tilting?"      Yes 3. LIGHTHEADED: "Do you feel lightheaded?" (e.g., somewhat faint, woozy, weak upon standing)     No 4. SEVERITY: "How bad is it?"  "Can you walk?"   - MILD - Feels unsteady but walking normally.   - MODERATE - Feels very unsteady when walking, but not falling; interferes with normal activities (e.g., school, work) .   - SEVERE - Unable to walk without falling (requires assistance).     Moderate 5. ONSET:  "When did the dizziness begin?"     Yesterday 6. AGGRAVATING FACTORS: "Does anything make it worse?" (e.g., standing, change in head position)     Changing position 7. CAUSE: "What do you think is causing the dizziness?"     Vertigo 8. RECURRENT SYMPTOM: "Have you had dizziness before?" If so, ask: "When was the last time?" "What happened that time?"     Yes - 3 years ago 9. OTHER SYMPTOMS: "Do you have any other symptoms?" (e.g., headache, weakness, numbness, vomiting, earache)     Nausea 10. PREGNANCY: "Is there any chance you are pregnant?" "When was your last menstrual period?"       No  Protocols used: DIZZINESS - VERTIGO-A-AH

## 2017-11-09 NOTE — Patient Instructions (Addendum)
Check out YouTube if you are having confusion regarding the maneuver.  How to Perform the Epley Maneuver The Epley maneuver is an exercise that relieves symptoms of vertigo. Vertigo is the feeling that you or your surroundings are moving when they are not. When you feel vertigo, you may feel like the room is spinning and have trouble walking. Dizziness is a little different than vertigo. When you are dizzy, you may feel unsteady or light-headed. You can do this maneuver at home whenever you have symptoms of vertigo. You can do it up to 3 times a day until your symptoms go away. Even though the Epley maneuver may relieve your vertigo for a few weeks, it is possible that your symptoms will return. This maneuver relieves vertigo, but it does not relieve dizziness. What are the risks? If it is done correctly, the Epley maneuver is considered safe. Sometimes it can lead to dizziness or nausea that goes away after a short time. If you develop other symptoms, such as changes in vision, weakness, or numbness, stop doing the maneuver and call your health care provider. How to perform the Epley maneuver 1. Sit on the edge of a bed or table with your back straight and your legs extended or hanging over the edge of the bed or table. 2. Turn your head halfway toward the affected ear or side. 3. Lie backward quickly with your head turned until you are lying flat on your back. You may want to position a pillow under your shoulders. 4. Hold this position for 30 seconds. You may experience an attack of vertigo. This is normal. 5. Turn your head to the opposite direction until your unaffected ear is facing the floor. 6. Hold this position for 30 seconds. You may experience an attack of vertigo. This is normal. Hold this position until the vertigo stops. 7. Turn your whole body to the same side as your head. Hold for another 30 seconds. 8. Sit back up. You can repeat this exercise up to 3 times a day. Follow these  instructions at home:  After doing the Epley maneuver, you can return to your normal activities.  Ask your health care provider if there is anything you should do at home to prevent vertigo. He or she may recommend that you: ? Keep your head raised (elevated) with two or more pillows while you sleep. ? Do not sleep on the side of your affected ear. ? Get up slowly from bed. ? Avoid sudden movements during the day. ? Avoid extreme head movement, like looking up or bending over. Contact a health care provider if:  Your vertigo gets worse.  You have other symptoms, including: ? Nausea. ? Vomiting. ? Headache. Get help right away if:  You have vision changes.  You have a severe or worsening headache or neck pain.  You cannot stop vomiting.  You have new numbness or weakness in any part of your body. Summary  Vertigo is the feeling that you or your surroundings are moving when they are not.  The Epley maneuver is an exercise that relieves symptoms of vertigo.  If the Epley maneuver is done correctly, it is considered safe. You can do it up to 3 times a day. This information is not intended to replace advice given to you by your health care provider. Make sure you discuss any questions you have with your health care provider. Document Released: 05/13/2013 Document Revised: 03/28/2016 Document Reviewed: 03/28/2016 Elsevier Interactive Patient Education  2017 Elsevier  Inc.  Let us know if you need anything.

## 2017-11-09 NOTE — Progress Notes (Signed)
Chief Complaint  Patient presents with  . Dizziness    Michelle Kennedy is 62 y.o. pt here for dizziness.  Duration: 1 day Pass out? No Spinning? Yes Recent illness/fever? No Headache? No Neurologic signs? No Change in PO intake? No  ROS:  Neuro: As noted in HPI Eyes: No vision changes  Past Medical History:  Diagnosis Date  . Abnormally small mouth   . Achilles tendon disorder, right   . Anemia   . Anxiety   . Arthritis    hips and knees  . Asthma    daily inhaler, prn inhaler and neb.  . Asthma due to environmental allergies   . Breast cancer ([place]) 01/2014   left  . Cancer ([place])   . Chest pain   . COPD (chronic obstructive pulmonary disease) ([place])   . Dental bridge present    upper front and lower right  . Dental crowns present    x 3  . Depression   . Family history of anesthesia complication    twin brother aspirated and died on OR table, per pt.  . Fibromyalgia   . Gallbladder disease   . GERD (gastroesophageal reflux disease)   . H/O blood clots   . History of gastric ulcer    as a teenager  . History of seizure age 77   as a reaction to Penicillin - no seizures since  . History of stomach ulcers   . History of thyroid cancer    s/p thyroidectomy  . Hyperlipidemia   . Hypothyroidism   . Joint pain   . Left knee injury   . Liver disorder   . Lymphedema    left arm  . Migraines   . Multiple food allergies   . Obesity   . OSA (obstructive sleep apnea) 02/16/2016  . Palpitations   . Personal history of chemotherapy 2015  . Personal history of radiation therapy 2015   Left  . Prediabetes   . Rheumatoid arthritis ([place])   . SOB (shortness of breath)   . Swelling of both ankles   . Tinnitus   . UTI (lower urinary tract infection) 02/17/2014  . Vitamin D deficiency    BP 132/84 (BP Location: Right Arm, Patient Position: Sitting, Cuff Size: Large)   Pulse 67   Temp 98.2 F (36.8 C) (Oral)   Ht 5\' 7"  (1.702 m)   Wt 270 lb 8 oz (122.7 kg)   SpO2  95%   BMI 42.37 kg/m  General: Awake, alert, appears stated age Eyes: PERRLA, EOMi Ears: Patent, TM's neg b/l Heart: RRR, no carotid bruits Lungs: CTAB, no accessory muscle use MSK: 5/5 strength throughout, gait normal Neuro: No cerebellar signs, patellar reflex 1/4 b/l wo clonus, calcaneal reflex 1/4 b/l wo clonus, biceps reflex 0/4 b/l wo clonus; Dix-Hall-Pike positive. Psych: Age appropriate judgment and insight, normal mood and affect  Benign paroxysmal positional vertigo due to bilateral vestibular disorder - Plan: meclizine (ANTIVERT) 25 MG tablet, ondansetron (ZOFRAN) 4 MG tablet  Orders as above. Epley Maneuver info provided. YouTube if instructions don't make sense. F/u prn. Pt voiced understanding and agreement to the plan.  Wilson, DO 11/09/17 3:14 PM

## 2017-11-23 ENCOUNTER — Ambulatory Visit: Payer: 59 | Admitting: Family

## 2017-11-25 ENCOUNTER — Other Ambulatory Visit: Payer: Self-pay | Admitting: Internal Medicine

## 2017-11-26 ENCOUNTER — Encounter: Payer: Self-pay | Admitting: Family

## 2017-11-26 ENCOUNTER — Ambulatory Visit (INDEPENDENT_AMBULATORY_CARE_PROVIDER_SITE_OTHER): Payer: 59 | Admitting: Family

## 2017-11-26 VITALS — BP 141/79 | HR 91 | Temp 98.2°F | Resp 18 | Ht 67.0 in | Wt 271.2 lb

## 2017-11-26 DIAGNOSIS — M1712 Unilateral primary osteoarthritis, left knee: Secondary | ICD-10-CM

## 2017-11-26 DIAGNOSIS — I10 Essential (primary) hypertension: Secondary | ICD-10-CM

## 2017-11-26 NOTE — Patient Instructions (Signed)
Good luck with your upcoming surgery.

## 2017-11-26 NOTE — Progress Notes (Signed)
SURGICAL CLEARANCE 09-10-17 O'[place]ULLIVAN, NP Epic   CXR, EKG 09-10-17 Epic

## 2017-11-26 NOTE — Patient Instructions (Addendum)
Michelle Kennedy  11/26/2017   Your procedure is scheduled on: 12-03-17   Report to [facility] Main  Entrance    Report to admitting at 7:00AM    Call this number if you have problems the morning of surgery [phone]     Remember: Do not eat food or drink liquids :After Midnight.  PLEASE BRING CPAP MASK AND  TUBING ONLY. DEVICE WILL BE PROVIDED!     Take these medicines the morning of surgery with A SIP OF WATER: AMLODIPINE, ATORVASTATIN, DULOXETINE, LEVOTHYROXINE, ANASTROZOLE, PROVENTIL INHALER IF NEEDED, ALBUTEROL INHALER IF NEEDED (please bring)                                 You may not have any metal on your body including hair pins and              piercings  Do not wear jewelry, make-up, lotions, powders or perfumes, deodorant             Do not wear nail polish.  Do not shave  48 hours prior to surgery.      Do not bring valuables to the hospital. [place].  Contacts, dentures or bridgework may not be worn into surgery.  Leave suitcase in the car. After surgery it may be brought to your room.                  Please read over the following fact sheets you were given: _____________________________________________________________________             [facility] - Preparing for Surgery Before surgery, you can play an important role.  Because skin is not sterile, your skin needs to be as free of germs as possible.  You can reduce the number of germs on your skin by washing with CHG (chlorahexidine gluconate) soap before surgery.  CHG is an antiseptic cleaner which kills germs and bonds with the skin to continue killing germs even after washing. Please DO NOT use if you have an allergy to CHG or antibacterial soaps.  If your skin becomes reddened/irritated stop using the CHG and inform your nurse when you arrive at Short Stay. Do not shave (including legs and underarms) for at least 48 hours  prior to the first CHG shower.  You may shave your face/neck. Please follow these instructions carefully:  1.  Shower with CHG Soap the night before surgery and the  morning of Surgery.  2.  If you choose to wash your hair, wash your hair first as usual with your  normal  shampoo.  3.  After you shampoo, rinse your hair and body thoroughly to remove the  shampoo.                           4.  Use CHG as you would any other liquid soap.  You can apply chg directly  to the skin and wash                       Gently with a scrungie or clean washcloth.  5.  Apply the CHG Soap to your body ONLY FROM THE NECK DOWN.  Do not use on face/ open                           Wound or open sores. Avoid contact with eyes, ears mouth and genitals (private parts).                       Wash face,  Genitals (private parts) with your normal soap.             6.  Wash thoroughly, paying special attention to the area where your surgery  will be performed.  7.  Thoroughly rinse your body with warm water from the neck down.  8.  DO NOT shower/wash with your normal soap after using and rinsing off  the CHG Soap.                9.  Pat yourself dry with a clean towel.            10.  Wear clean pajamas.            11.  Place clean sheets on your bed the night of your first shower and do not  sleep with pets. Day of Surgery : Do not apply any lotions/deodorants the morning of surgery.  Please wear clean clothes to the hospital/surgery center.  FAILURE TO FOLLOW THESE INSTRUCTIONS MAY RESULT IN THE CANCELLATION OF YOUR SURGERY PATIENT SIGNATURE_________________________________  NURSE SIGNATURE__________________________________  ________________________________________________________________________   Adam Phenix  An incentive spirometer is a tool that can help keep your lungs clear and active. This tool measures how well you are filling your lungs with each breath. Taking long deep breaths may help reverse  or decrease the chance of developing breathing (pulmonary) problems (especially infection) following:  A long period of time when you are unable to move or be active. BEFORE THE PROCEDURE   If the spirometer includes an indicator to show your best effort, your nurse or respiratory therapist will set it to a desired goal.  If possible, sit up straight or lean slightly forward. Try not to slouch.  Hold the incentive spirometer in an upright position. INSTRUCTIONS FOR USE  1. Sit on the edge of your bed if possible, or sit up as far as you can in bed or on a chair. 2. Hold the incentive spirometer in an upright position. 3. Breathe out normally. 4. Place the mouthpiece in your mouth and seal your lips tightly around it. 5. Breathe in slowly and as deeply as possible, raising the piston or the ball toward the top of the column. 6. Hold your breath for 3-5 seconds or for as long as possible. Allow the piston or ball to fall to the bottom of the column. 7. Remove the mouthpiece from your mouth and breathe out normally. 8. Rest for a few seconds and repeat Steps 1 through 7 at least 10 times every 1-2 hours when you are awake. Take your time and take a few normal breaths between deep breaths. 9. The spirometer may include an indicator to show your best effort. Use the indicator as a goal to work toward during each repetition. 10. After each set of 10 deep breaths, practice coughing to be sure your lungs are clear. If you have an incision (the cut made at the time of surgery), support your incision when coughing by placing a pillow or rolled up towels firmly against it. Once you are able to get out of  bed, walk around indoors and cough well. You may stop using the incentive spirometer when instructed by your caregiver.  RISKS AND COMPLICATIONS  Take your time so you do not get dizzy or light-headed.  If you are in pain, you may need to take or ask for pain medication before doing incentive  spirometry. It is harder to take a deep breath if you are having pain. AFTER USE  Rest and breathe slowly and easily.  It can be helpful to keep track of a log of your progress. Your caregiver can provide you with a simple table to help with this. If you are using the spirometer at home, follow these instructions: [place] IF:   You are having difficultly using the spirometer.  You have trouble using the spirometer as often as instructed.  Your pain medication is not giving enough relief while using the spirometer.  You develop fever of 100.5 F (38.1 C) or higher. SEEK IMMEDIATE MEDICAL CARE IF:   You cough up bloody sputum that had not been present before.  You develop fever of 102 F (38.9 C) or greater.  You develop worsening pain at or near the incision site. MAKE SURE YOU:   Understand these instructions.  Will watch your condition.  Will get help right away if you are not doing well or get worse. Document Released: 09/18/2006 Document Revised: 07/31/2011 Document Reviewed: 11/19/2006 Exit[employer] Patient Information 2014 Exit[employer], [place].   ________________________________________________________________________  WHAT IS A BLOOD TRANSFUSION? Blood Transfusion Information  A transfusion is the replacement of blood or some of its parts. Blood is made up of multiple cells which provide different functions.  Red blood cells carry oxygen and are used for blood loss replacement.  White blood cells fight against infection.  Platelets control bleeding.  Plasma helps clot blood.  Other blood products are available for specialized needs, such as hemophilia or other clotting disorders. BEFORE THE TRANSFUSION  Who gives blood for transfusions?   Healthy volunteers who are fully evaluated to make sure their blood is safe. This is blood bank blood. Transfusion therapy is the safest it has ever been in the practice of medicine. Before blood is taken from a donor, a  complete history is taken to make sure that person has no history of diseases nor engages in risky social behavior (examples are intravenous drug use or sexual activity with multiple partners). The donor's travel history is screened to minimize risk of transmitting infections, such as malaria. The donated blood is tested for signs of infectious diseases, such as HIV and hepatitis. The blood is then tested to be sure it is compatible with you in order to minimize the chance of a transfusion reaction. If you or a relative donates blood, this is often done in anticipation of surgery and is not appropriate for emergency situations. It takes many days to process the donated blood. RISKS AND COMPLICATIONS Although transfusion therapy is very safe and saves many lives, the main dangers of transfusion include:   Getting an infectious disease.  Developing a transfusion reaction. This is an allergic reaction to something in the blood you were given. Every precaution is taken to prevent this. The decision to have a blood transfusion has been considered carefully by your caregiver before blood is given. Blood is not given unless the benefits outweigh the risks. AFTER THE TRANSFUSION  Right after receiving a blood transfusion, you will usually feel much better and more energetic. This is especially true if your red blood  cells have gotten low (anemic). The transfusion raises the level of the red blood cells which carry oxygen, and this usually causes an energy increase.  The nurse administering the transfusion will monitor you carefully for complications. HOME CARE INSTRUCTIONS  No special instructions are needed after a transfusion. You may find your energy is better. Speak with your caregiver about any limitations on activity for underlying diseases you may have. SEEK MEDICAL CARE IF:   Your condition is not improving after your transfusion.  You develop redness or irritation at the intravenous (IV)  site. SEEK IMMEDIATE MEDICAL CARE IF:  Any of the following symptoms occur over the next 12 hours:  Shaking chills.  You have a temperature by mouth above 102 F (38.9 C), not controlled by medicine.  Chest, back, or muscle pain.  People around you feel you are not acting correctly or are confused.  Shortness of breath or difficulty breathing.  Dizziness and fainting.  You get a rash or develop hives.  You have a decrease in urine output.  Your urine turns a dark color or changes to pink, red, or brown. Any of the following symptoms occur over the next 10 days:  You have a temperature by mouth above 102 F (38.9 C), not controlled by medicine.  Shortness of breath.  Weakness after normal activity.  The white part of the eye turns yellow (jaundice).  You have a decrease in the amount of urine or are urinating less often.  Your urine turns a dark color or changes to pink, red, or brown. Document Released: 05/05/2000 Document Revised: 07/31/2011 Document Reviewed: 12/23/2007 [facility] Patient Information 2014 [place], [place].  _______________________________________________________________________

## 2017-11-26 NOTE — Progress Notes (Addendum)
Subjective:    Patient ID: Michelle Kennedy, female    DOB: [birth year], 62 y.o.   MRN: [id number]  HPI  Patient is a 62 yr old female who presents today for follow up.  HTN- maintained on amlodipine 5mg  once daily.  BP Readings from Last 3 Encounters:  11/26/17 (!) 141/79  11/09/17 132/84  09/17/17 126/84   She is scheduled for L TKA on 6/15. Continues to have pain in the left knee and she is anxious to have this surgery completed.  She has been swimming every day.    Review of Systems See HPI  Past Medical History:  Diagnosis Date  . Abnormally small mouth   . Achilles tendon disorder, right   . Anemia   . Anxiety   . Arthritis    hips and knees  . Asthma    daily inhaler, prn inhaler and neb.  . Asthma due to environmental allergies   . Breast cancer ([place]) 01/2014   left  . Cancer ([place])   . Chest pain   . COPD (chronic obstructive pulmonary disease) ([place])   . Dental bridge present    upper front and lower right  . Dental crowns present    x 3  . Depression   . Family history of anesthesia complication    twin brother aspirated and died on OR table, per pt.  . Fibromyalgia   . Gallbladder disease   . GERD (gastroesophageal reflux disease)   . H/O blood clots   . History of gastric ulcer    as a teenager  . History of seizure age 59   as a reaction to Penicillin - no seizures since  . History of stomach ulcers   . History of thyroid cancer    s/p thyroidectomy  . Hyperlipidemia   . Hypothyroidism   . Joint pain   . Left knee injury   . Liver disorder   . Lymphedema    left arm  . Migraines   . Multiple food allergies   . Obesity   . OSA (obstructive sleep apnea) 02/16/2016  . Palpitations   . Personal history of chemotherapy 2015  . Personal history of radiation therapy 2015   Left  . Prediabetes   . Rheumatoid arthritis ([place])   . SOB (shortness of breath)   . Swelling of both ankles   . Tinnitus   . UTI (lower urinary tract infection)  02/17/2014  . Vitamin D deficiency      Social History   Socioeconomic History  . Marital status: Married    Spouse name: Not on file  . Number of children: Not on file  . Years of education: Not on file  . Highest education level: Not on file  Occupational History  . 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 Financial resource strain: Not on file  . Food insecurity:    Worry: Not on file    Inability: Not on file  . Transportation needs:    Medical: Not on file    Non-medical: Not on file  Tobacco Use  . Smoking status: Never Smoker  . Smokeless tobacco: Never Used  Substance and Sexual Activity  . Alcohol use: No    Alcohol/week: 0.0 oz  . Drug use: No  . Sexual activity: Not Currently    Partners: Male    Comment: menarche age 21, P 2, first birth age 63, menopause age 84, Premarin x 10 yrs  Lifestyle  . Physical activity:    Days per week: Not on file    Minutes per session: Not on file  . Stress: Not on file  Relationships  . Social connections:    Talks on phone: Not on file    Gets together: Not on file    Attends religious service: Not on file    Active member of club or organization: Not on file    Attends meetings of clubs or organizations: Not on file    Relationship status: Not on file  . Intimate partner violence:    Fear of current or ex partner: Not on file    Emotionally abused: Not on file    Physically abused: Not on file    Forced sexual activity: Not on file  Other Topics Concern  . Not on file  Social History Narrative   Regular exercise: yes       Past Surgical History:  Procedure Laterality Date  . ABDOMINAL HYSTERECTOMY  2004   complete  . ACHILLES TENDON REPAIR Right   . APPENDECTOMY  2004  . BREAST EXCISIONAL BIOPSY    . BREAST LUMPECTOMY Left    2015  . BREAST LUMPECTOMY WITH NEEDLE LOCALIZATION AND AXILLARY SENTINEL LYMPH NODE BX Left 02/23/2014   Procedure: BREAST LUMPECTOMY WITH NEEDLE LOCALIZATION  AND AXILLARY SENTINEL LYMPH NODE BIOPSY;  Surgeon: Excell Seltzer, MD;  Location: [facility];  Service: General;  Laterality: Left;  . BREAST SURGERY  2011   left breast biposy  . CHOLECYSTECTOMY  1990  . COLONOSCOPY W/ POLYPECTOMY  06/2009  . EYE SURGERY Right 2013   exc. warts from underneath eyelid  . INCONTINENCE SURGERY  2004  . KNEE ARTHROSCOPY Bilateral    x 6 each knee  . LIGAMENT REPAIR Right    thumb/wrist  . ORIF TOE FRACTURE Right    great toe  . PORTACATH PLACEMENT N/A 03/24/2014   Procedure: INSERTION PORT-A-CATH;  Surgeon: Excell Seltzer, MD;  Location: [facility];  Service: General;  Laterality: N/A;  . THYROIDECTOMY  2000  . TONSILLECTOMY AND ADENOIDECTOMY  2000  . TUMOR EXCISION     from thoracic spine    Family History  Problem Relation Age of [place]  . Alcohol abuse Mother   . Arthritis Mother   . Hypertension Mother   . Bipolar disorder Mother   . Breast cancer Mother 22       unconfirmed  . Lung cancer Mother 17       smoker  . Hyperlipidemia Mother   . Stroke Mother   . Cancer Mother   . Depression Mother   . Anxiety disorder Mother   . Alcoholism Mother   . Drug abuse Mother   . Eating disorder Mother   . Obesity Mother   . Alcohol abuse Father   . Cancer Father        lung  . Hyperlipidemia Father   . Kidney disease Father   . Diabetes Father   . Hypertension Father   . Cystic kidney disease Father   . Thyroid disease Father   . Liver disease Father   . Alcoholism Father   . Arthritis Maternal Grandmother   . Diabetes Paternal Grandmother   . Thyroid cancer Sister 3       type?; currently 78  . Other Sister        ovarian tumor @ 28; TAH/BSO  . Breast cancer Sister   . Thyroid cancer Brother  dx 42s; currently 64  . Breast cancer Maternal Aunt        dx 74s; deceased 39  . Thyroid cancer Paternal Aunt        All 3 paternal aunts with thyroid ca in 30s/40s  . Lung cancer Paternal Aunt         2 of 3 paternal aunts with lung cancer    Allergies  Allergen Reactions  . Bee Venom Anaphylaxis  . Contrast Media [Iodinated Diagnostic Agents] Shortness Of Breath  . Iodine Other (See Comments)    CARDIAC ARREST  . Latex Anaphylaxis and Rash  . Penicillins Shortness Of Breath, Rash and Other (See Comments)    SEIZURE  . Shellfish Allergy Shortness Of Breath  . Aspirin Rash and Other (See Comments)    GI UPSET  . Erythromycin Swelling and Rash    SWELLING OF JOINTS  . Lidocaine Swelling    SWELLING OF MOUTH AND THROAT  . Symbicort [Budesonide-Formoterol Fumarate] Other (See Comments)    BURNING OF TONGUE AND LIPS  . Codeine Rash  . Compazine [Prochlorperazine Maleate] Rash    Rash on face,chest, arms, back  . Pentazocine Lactate Rash  . Sulfonamide Derivatives Rash    Current Outpatient Medications on File Prior to Visit  Medication Sig Dispense Refill  . albuterol (PROAIR HFA) 108 (90 Base) MCG/ACT inhaler INHALE 2 PUFFS INTO THE LUNGS EVERY 6 HOURS AS NEEDED 3 Inhaler 0  . albuterol (PROVENTIL) (5 MG/ML) 0.5% nebulizer solution Take 2.5 mg by nebulization every 6 (six) hours as needed for wheezing or shortness of breath.    Marland Kitchen amLODipine (NORVASC) 5 MG tablet Take 1 tablet (5 mg total) by mouth daily. 30 tablet 3  . anastrozole (ARIMIDEX) 1 MG tablet TAKE 1 TABLET BY MOUTH  DAILY 90 tablet 3  . atorvastatin (LIPITOR) 20 MG tablet Take 1 tablet (20 mg total) by mouth daily. 90 tablet 1  . Calcium Carbonate (CALCIUM 500 PO) Take 1 tablet by mouth daily.     . diphenhydrAMINE (BENADRYL) 25 MG tablet Take 1 tablet an hour before MRI.  Then take 1 tablet at bedtime as needed, per your usual regimen. 30 tablet 0  . DULoxetine (CYMBALTA) 60 MG capsule TAKE 1 CAPSULE BY MOUTH  DAILY 90 capsule 1  . EPINEPHrine 0.3 mg/0.3 mL IJ SOAJ injection Inject 0.3 mLs (0.3 mg total) into the muscle once. 2 Device 0  . Ergocalciferol (VITAMIN D2) 400 UNITS TABS Take 1 tablet by mouth daily.      . Fluticasone-Salmeterol (ADVAIR DISKUS) 250-50 MCG/DOSE AEPB Inhale 1 puff into the lungs 2 (two) times daily. (Patient taking differently: Inhale 1 puff into the lungs daily. ) 180 each 1  . levothyroxine (SYNTHROID, LEVOTHROID) 150 MCG tablet TAKE 1 TABLET BY MOUTH  DAILY 90 tablet 3  . meclizine (ANTIVERT) 25 MG tablet Take 1 tablet (25 mg total) by mouth 3 (three) times daily as needed for dizziness. 30 tablet 0  . meloxicam (MOBIC) 7.5 MG tablet Take 1 tablet (7.5 mg total) by mouth daily. 90 tablet 1  . Multiple Vitamin (MULTIVITAMIN) tablet Take 1 tablet by mouth daily.      Marland Kitchen MYRBETRIQ 50 MG TB24 tablet TAKE 1 TABLET BY MOUTH  DAILY 90 tablet 1  . nitroGLYCERIN (NITROSTAT) 0.4 MG SL tablet Place 1 tablet (0.4 mg total) under the tongue every 5 (five) minutes as needed for chest pain. 30 tablet 0  . Omega-3 Fatty Acids (FISH OIL) 1200 MG CAPS  Take 1 capsule by mouth daily.      . ondansetron (ZOFRAN) 4 MG tablet Take 1 tablet (4 mg total) by mouth every 8 (eight) hours as needed. 20 tablet 0  . topiramate (TOPAMAX) 50 MG tablet Take 1 tablet (50 mg total) by mouth daily. 30 tablet 0  . traMADol (ULTRAM) 50 MG tablet Take 50 mg by mouth every 6 (six) hours as needed.     No current facility-administered medications on file prior to visit.     BP (!) 141/79 (BP Location: Right Arm, Cuff Size: Large)   Pulse 91   Temp 98.2 F (36.8 C) (Oral)   Resp 18   Ht 5\' 7"  (1.702 m)   Wt 271 lb 3.2 oz (123 kg)   SpO2 98%   BMI 42.48 kg/m       Objective:   Physical Exam  Constitutional: She appears well-developed and well-nourished.  HENT:  Head: Normocephalic and atraumatic.  Cardiovascular: Normal rate, regular rhythm and normal heart sounds.  No murmur heard. Pulmonary/Chest: Effort normal and breath sounds normal. No respiratory distress. She has no wheezes.  Skin: Skin is warm and dry.  1+ bilateral LE edema  Psychiatric: She has a normal mood and affect. Her behavior is  normal. Judgment and thought content normal.          Assessment & Plan:  Hypertension- fair bp control. Continue amlodipine.    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 Will review lab work and if stable plan clearance for upcoming surgery on 7/15.   Addendum: reviewed lab work from pre-admission testing. Lab work is stable with exception of + leuks on UA.  Will treat empirically for UTI.  No other medical contraindications to upcoming planned surgery.

## 2017-11-27 ENCOUNTER — Encounter (HOSPITAL_COMMUNITY)
Admission: RE | Admit: 2017-11-27 | Discharge: 2017-11-27 | Disposition: A | Discharge status: Home / Self Care | Payer: 59 | Source: Ambulatory Visit | Attending: Orthopedic Surgery | Admitting: Orthopedic Surgery

## 2017-11-27 ENCOUNTER — Encounter (HOSPITAL_COMMUNITY): Payer: Self-pay

## 2017-11-27 ENCOUNTER — Other Ambulatory Visit: Payer: Self-pay

## 2017-11-27 DIAGNOSIS — Z01812 Encounter for preprocedural laboratory examination: Secondary | ICD-10-CM | POA: Diagnosis not present

## 2017-11-27 DIAGNOSIS — M1712 Unilateral primary osteoarthritis, left knee: Secondary | ICD-10-CM | POA: Insufficient documentation

## 2017-11-27 HISTORY — DX: Presence of spectacles and contact lenses: Z97.3

## 2017-11-27 HISTORY — DX: Dyskinesia of esophagus: K22.4

## 2017-11-27 HISTORY — DX: Dizziness and giddiness: R42

## 2017-11-27 HISTORY — DX: Presence of external hearing-aid: Z97.4

## 2017-11-27 LAB — PROTIME-INR
INR: 1.01
PROTHROMBIN TIME: 13.2 s (ref 11.4–15.2)

## 2017-11-27 LAB — URINALYSIS, ROUTINE W REFLEX MICROSCOPIC
Bilirubin Urine: NEGATIVE
GLUCOSE, UA: NEGATIVE mg/dL
Hgb urine dipstick: NEGATIVE
KETONES UR: NEGATIVE mg/dL
Nitrite: NEGATIVE
PROTEIN: NEGATIVE mg/dL
Specific Gravity, Urine: 1.011 (ref 1.005–1.030)
pH: 6 (ref 5.0–8.0)

## 2017-11-27 LAB — COMPREHENSIVE METABOLIC PANEL
ALBUMIN: 4.2 g/dL (ref 3.5–5.0)
ALK PHOS: 63 U/L (ref 38–126)
ALT: 34 U/L (ref 0–44)
ANION GAP: 9 (ref 5–15)
AST: 28 U/L (ref 15–41)
BILIRUBIN TOTAL: 0.7 mg/dL (ref 0.3–1.2)
BUN: 12 mg/dL (ref 8–23)
CALCIUM: 9.8 mg/dL (ref 8.9–10.3)
CO2: 27 mmol/L (ref 22–32)
Chloride: 105 mmol/L (ref 98–111)
Creatinine, Ser: 0.85 mg/dL (ref 0.44–1.00)
GFR calc Af Amer: >60 mL/min (ref >60)
GFR calc non Af Amer: >60 mL/min (ref >60)
Glucose, Bld: 102 mg/dL — ABNORMAL HIGH (ref 70–99)
POTASSIUM: 4.3 mmol/L (ref 3.5–5.1)
Sodium: 141 mmol/L (ref 135–145)
TOTAL PROTEIN: 7.1 g/dL (ref 6.5–8.1)

## 2017-11-27 LAB — CBC
HEMATOCRIT: 45.9 % (ref 36.0–46.0)
HEMOGLOBIN: 14.9 g/dL (ref 12.0–15.0)
MCH: 25.8 pg — AB (ref 26.0–34.0)
MCHC: 32.5 g/dL (ref 30.0–36.0)
MCV: 79.4 fL (ref 78.0–100.0)
Platelets: 363 10*3/uL (ref 150–400)
RBC: 5.78 MIL/uL — ABNORMAL HIGH (ref 3.87–5.11)
RDW: 16 % — AB (ref 11.5–15.5)
WBC: 6.4 10*3/uL (ref 4.0–10.5)

## 2017-11-27 LAB — SURGICAL PCR SCREEN
MRSA, PCR: NEGATIVE
Staphylococcus aureus: NEGATIVE

## 2017-11-27 LAB — ABO/RH: ABO/RH(D): O POS

## 2017-11-27 LAB — APTT: APTT: 29 s (ref 24–36)

## 2017-11-28 ENCOUNTER — Telehealth: Payer: Self-pay | Admitting: Family

## 2017-11-28 MED ORDER — CIPROFLOXACIN HCL 500 MG PO TABS: 500.0000 mg | ORAL_TABLET | Freq: Two times a day (BID) | ORAL | 0 refills | Status: DC

## 2017-11-28 NOTE — Telephone Encounter (Signed)
Left message for pt to check mychart. Message sent. Office note faxed to [phone].

## 2017-11-28 NOTE — Telephone Encounter (Signed)
Could you please send copy of my addended office note from Monday for surgical clearance to her orthopedist?  I would like her to start cipro x 3 days for possible UTI noted on UA performed at the hospital.

## 2017-11-30 NOTE — H&P (Signed)
TOTAL KNEE ADMISSION H&P  Patient is being admitted for left total knee arthroplasty.  Subjective:  Chief Complaint:left knee pain.  HPI: Michelle Kennedy, 62 y.o. female, has a history of pain and functional disability in the left knee due to arthritis and has failed non-surgical conservative treatments for greater than 12 weeks to includeNSAID's and/or analgesics, corticosteriod injections, viscosupplementation injections, flexibility and strengthening excercises, use of assistive devices, weight reduction as appropriate and activity modification.  Onset of symptoms was gradual, starting >10 years ago with gradually worsening course since that time. The patient noted prior procedures on the knee to include  arthroscopy, menisectomy and patellar realignment on the left knee(s).  Patient currently rates pain in the left knee(s) at 10 out of 10 with activity. Patient has night pain, worsening of pain with activity and weight bearing, pain that interferes with activities of daily living, pain with passive range of motion, crepitus and joint swelling.  Patient has evidence of subchondral sclerosis, periarticular osteophytes and joint space narrowing by imaging studies. There is no active infection.  Patient Active Problem List   Diagnosis Date Noted  . Class 3 obesity without serious comorbidity with body mass index (BMI) of 40.0 to 44.9 in adult 01/01/2017  . Essential hypertension 10/18/2016  . Constipation 10/04/2016  . Vitamin D deficiency 10/04/2016  . Prediabetes 10/04/2016  . Morbid obesity ([place]) 09/20/2016  . Other fatigue 09/20/2016  . OSA (obstructive sleep apnea) 02/16/2016  . Right ankle pain 01/20/2016  . Strain of right Achilles tendon 11/30/2015  . Benign hypertension 10/11/2015  . Malignant neoplasm of thyroid gland ([place]) 10/11/2015  . De Quervain's tenosynovitis, bilateral 09/15/2015  . Right shoulder pain 09/15/2015  . Trigger finger, acquired 06/22/2015  . Left wrist pain  06/22/2015  . Thumb injury 06/14/2015  . Chemotherapy-induced peripheral neuropathy ([place]) 05/10/2015  . Postmenopausal osteoporosis 12/08/2014  . Preventative health care 12/01/2014  . DVT (deep venous thrombosis) ([place]) 08/28/2014  . Lymphedema of arm 08/28/2014  . Mucositis due to chemotherapy 06/02/2014  . UTI symptoms 02/17/2014  . Menopausal syndrome (hot flashes) 12/31/2013  . Breast cancer of upper-outer quadrant of left female breast ([place]) 12/26/2013  . 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 Gingivitis 12/05/2013  . Seasonal allergies 09/24/2013  . Skin lesion 06/03/2013  . Menopause 06/02/2013  . Elevated blood-pressure reading without diagnosis of hypertension 03/05/2013  . Cough 07/24/2012  . Left knee injury 11/02/2011  . Gout 09/19/2011  . Hyperlipidemia 06/02/2011  . Migraine 06/02/2011  . Tinnitus of both ears 02/14/2011  . Left knee pain 09/23/2010  . FIBROMYALGIA 05/10/2010  . Asthma 03/22/2010  . 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 OVERWEIGHT 12/20/2009  . GERD 12/20/2009  . Depression 12/01/2009   Past Medical History:  Diagnosis Date  . Abnormally small mouth   . Achilles tendon disorder, right   . Anemia   . Anxiety   . Arthritis    hips and knees  . Asthma    daily inhaler, prn inhaler and neb.  . Asthma due to environmental allergies   . Breast cancer ([place]) 01/2014   left  . Cancer ([place])   . Chest pain   . COPD (chronic obstructive pulmonary disease) ([place])   . Dental bridge present    upper front and lower right  . Dental crowns present    x 3  . Depression   . Esophageal spasm    reports since her chemo and breast surgery  she  developed esophageal spasms and reports this is in the past has casue her 02 to desat in the  70s , denies sycnope in relation to this , does report hx of vertigo as well   . Family history of anesthesia complication    twin brother aspirated and died on OR table, per pt.  . Fibromyalgia    . Gallbladder disease   . GERD (gastroesophageal reflux disease)   . H/O blood clots    had blood to  PICC line   . History of gastric ulcer    as a teenager  . History of seizure age 60   as a reaction to Penicillin - no seizures since  . History of stomach ulcers   . History of thyroid cancer    s/p thyroidectomy  . Hyperlipidemia   . Hypothyroidism   . Joint pain   . Left knee injury   . Liver disorder    seen when she had her hysterectomy , reports she was told it was  " small lesion" ; but asymptomatic   . Lymphedema    left arm  . Migraines   . Multiple food allergies   . Obesity   . OSA (obstructive sleep apnea) 02/16/2016  . Palpitations    reports no longer experiences  . Personal history of chemotherapy 2015  . Personal history of radiation therapy 2015   Left  . Prediabetes   . Rheumatoid arthritis ([place])   . SOB (shortness of breath)   . Swelling of both ankles   . Tinnitus   . UTI (lower urinary tract infection) 02/17/2014  . Vertigo    last episode  was 2 weeks ago   . Vitamin D deficiency   . Wears contact lenses    left eye only   . Wears hearing aid in both ears     Past Surgical History:  Procedure Laterality Date  . ABDOMINAL HYSTERECTOMY  2004   complete  . ACHILLES TENDON REPAIR Right   . APPENDECTOMY  2004  . BREAST EXCISIONAL BIOPSY    . BREAST LUMPECTOMY Left    2015  . BREAST LUMPECTOMY WITH NEEDLE LOCALIZATION AND AXILLARY SENTINEL LYMPH NODE BX Left 02/23/2014   Procedure: BREAST LUMPECTOMY WITH NEEDLE LOCALIZATION AND AXILLARY SENTINEL LYMPH NODE BIOPSY;  Surgeon: Excell Seltzer, MD;  Location: [facility];  Service: General;  Laterality: Left;  . BREAST SURGERY  2011   left breast biposy  . CHOLECYSTECTOMY  1990  . COLONOSCOPY W/ POLYPECTOMY  06/2009  . EYE SURGERY Right 2013   exc. warts from underneath eyelid  . INCONTINENCE SURGERY  2004  . KNEE ARTHROSCOPY Bilateral    x 6 each knee  . LIGAMENT REPAIR Right     thumb/wrist  . ORIF TOE FRACTURE Right    great toe  . PORTACATH PLACEMENT N/A 03/24/2014   Procedure: INSERTION PORT-A-CATH;  Surgeon: Excell Seltzer, MD;  Location: [facility];  Service: General;  Laterality: N/A;; removed   . THYROIDECTOMY  2000  . TONSILLECTOMY AND ADENOIDECTOMY  2000  . TUMOR EXCISION     from thoracic spine    No current facility-administered medications for this encounter.    Current Outpatient Medications  Medication Sig Dispense Refill Last Dose  . albuterol (PROAIR HFA) 108 (90 Base) MCG/ACT inhaler INHALE 2 PUFFS INTO THE LUNGS EVERY 6 HOURS AS NEEDED (Patient taking differently: Inhale 2 puffs into the lungs every 6 (six) hours as needed for wheezing or  shortness of breath. ) 3 Inhaler 0 Taking  . albuterol (PROVENTIL) (5 MG/ML) 0.5% nebulizer solution Take 2.5 mg by nebulization every 6 (six) hours as needed for wheezing or shortness of breath.   Taking  . amLODipine (NORVASC) 5 MG tablet Take 1 tablet (5 mg total) by mouth daily. 30 tablet 3 Taking  . anastrozole (ARIMIDEX) 1 MG tablet TAKE 1 TABLET BY MOUTH  DAILY 90 tablet 3 Taking  . atorvastatin (LIPITOR) 20 MG tablet Take 1 tablet (20 mg total) by mouth daily. 90 tablet 1 Taking  . CALCIUM-MAGNESIUM PO Take 1 tablet by mouth daily.     . Cholecalciferol (VITAMIN D PO) Take 1 capsule by mouth daily.     . diphenhydrAMINE (BENADRYL) 25 MG tablet Take 1 tablet an hour before MRI.  Then take 1 tablet at bedtime as needed, per your usual regimen. (Patient taking differently: Take 25 mg by mouth at bedtime as needed for sleep. Take 1 tablet an hour before MRI.  Then take 1 tablet at bedtime as n) 30 tablet 0 Taking  . DULoxetine (CYMBALTA) 60 MG capsule TAKE 1 CAPSULE BY MOUTH  DAILY 90 capsule 1 Taking  . EPINEPHrine 0.3 mg/0.3 mL IJ SOAJ injection Inject 0.3 mLs (0.3 mg total) into the muscle once. 2 Device 0 Taking  . Ergocalciferol (VITAMIN D2) 400 units TABS Take 400 Units by mouth daily.      . Fluticasone-Salmeterol (ADVAIR DISKUS) 250-50 MCG/DOSE AEPB Inhale 1 puff into the lungs 2 (two) times daily. 180 each 1 Taking  . levothyroxine (SYNTHROID, LEVOTHROID) 150 MCG tablet TAKE 1 TABLET BY MOUTH  DAILY 90 tablet 3 Taking  . loratadine-pseudoephedrine (CLARITIN-D 24-HOUR) 10-240 MG 24 hr tablet Take 1 tablet by mouth daily as needed for allergies.     . meloxicam (MOBIC) 7.5 MG tablet Take 1 tablet (7.5 mg total) by mouth daily. 90 tablet 1 Taking  . Multiple Vitamin (MULTIVITAMIN) tablet Take 1 tablet by mouth daily.     Taking  . MYRBETRIQ 50 MG TB24 tablet TAKE 1 TABLET BY MOUTH  DAILY 90 tablet 1 Taking  . nitroGLYCERIN (NITROSTAT) 0.4 MG SL tablet Place 1 tablet (0.4 mg total) under the tongue every 5 (five) minutes as needed for chest pain. 30 tablet 0 Taking  . Omega-3 Fatty Acids (FISH OIL) 1200 MG CAPS Take 1 capsule by mouth daily.     Taking  . ondansetron (ZOFRAN) 4 MG tablet Take 1 tablet (4 mg total) by mouth every 8 (eight) hours as needed. (Patient taking differently: Take 4 mg by mouth every 8 (eight) hours as needed for nausea or vomiting. ) 20 tablet 0 Taking  . topiramate (TOPAMAX) 50 MG tablet Take 1 tablet (50 mg total) by mouth daily. 30 tablet 0 Taking  . traMADol (ULTRAM) 50 MG tablet Take 50 mg by mouth at bedtime.    Taking  . ciprofloxacin (CIPRO) 500 MG tablet Take 1 tablet (500 mg total) by mouth 2 (two) times daily. 6 tablet 0   . meclizine (ANTIVERT) 25 MG tablet Take 1 tablet (25 mg total) by mouth 3 (three) times daily as needed for dizziness. (Patient not taking: Reported on 11/26/2017) 30 tablet 0 Not Taking at Unknown time   Allergies  Allergen Reactions  . Bee Venom Anaphylaxis  . Contrast Media [Iodinated Diagnostic Agents] Shortness Of Breath  . Iodine Other (See Comments)    CARDIAC ARREST  . Latex Anaphylaxis and Rash  . Penicillins Shortness Of Breath, Rash  and Other (See Comments)    SEIZURE  . Shellfish Allergy Shortness Of Breath  and Rash  . Aspirin Rash and Other (See Comments)    GI UPSET  . Erythromycin Swelling and Rash    SWELLING OF JOINTS  . Lidocaine Swelling    SWELLING OF MOUTH AND THROAT  . Symbicort [Budesonide-Formoterol Fumarate] Other (See Comments)    BURNING OF TONGUE AND LIPS  . Codeine Rash  . Compazine [Prochlorperazine Maleate] Rash    Rash on face,chest, arms, back  . Pentazocine Lactate Rash  . Sulfonamide Derivatives Rash    Social History   Tobacco Use  . Smoking status: Never Smoker  . Smokeless tobacco: Never Used  Substance Use Topics  . Alcohol use: No    Alcohol/week: 0.0 oz    Family History  Problem Relation Age of Onset  . Alcohol abuse Mother   . Arthritis Mother   . Hypertension Mother   . Bipolar disorder Mother   . Breast cancer Mother 30       unconfirmed  . Lung cancer Mother 29       smoker  . Hyperlipidemia Mother   . Stroke Mother   . Cancer Mother   . Depression Mother   . Anxiety disorder Mother   . Alcoholism Mother   . Drug abuse Mother   . Eating disorder Mother   . Obesity Mother   . Alcohol abuse Father   . Cancer Father        lung  . Hyperlipidemia Father   . Kidney disease Father   . Diabetes Father   . Hypertension Father   . Cystic kidney disease Father   . Thyroid disease Father   . Liver disease Father   . Alcoholism Father   . Arthritis Maternal Grandmother   . Diabetes Paternal Grandmother   . Thyroid cancer Sister 23       type?; currently 34  . Other Sister        ovarian tumor @ 54; TAH/BSO  . Breast cancer Sister   . Thyroid cancer Brother        dx 52s; currently 67  . Breast cancer Maternal Aunt        dx 42s; deceased 56  . Thyroid cancer Paternal Aunt        All 3 paternal aunts with thyroid ca in 30s/40s  . Lung cancer Paternal Aunt        2 of 3 paternal aunts with lung cancer     Review of Systems  Constitutional: Positive for malaise/fatigue. Negative for chills, diaphoresis, fever and weight loss.   HENT: Positive for tinnitus. Negative for congestion, ear discharge, ear pain, hearing loss, nosebleeds, sinus pain and sore throat.   Eyes: Negative.   Respiratory: Positive for shortness of breath and wheezing. Negative for cough, hemoptysis, sputum production and stridor.   Cardiovascular: Positive for leg swelling. Negative for chest pain, palpitations, orthopnea, claudication and PND.  Gastrointestinal: Positive for heartburn. Negative for abdominal pain, blood in stool, constipation, diarrhea, melena, nausea and vomiting.  Genitourinary: Negative for dysuria, flank pain, frequency, hematuria and urgency.       Positive for incontinence  Musculoskeletal: Positive for back pain, joint pain and myalgias. Negative for falls and neck pain.  Skin: Negative.   Neurological: Positive for headaches. Negative for dizziness, tingling, tremors, sensory change, speech change, focal weakness, seizures, loss of consciousness and weakness.  Endo/Heme/Allergies: Positive for environmental allergies. Negative for polydipsia. Does not  bruise/bleed easily.  Psychiatric/Behavioral: Positive for depression and memory loss. Negative for hallucinations, substance abuse and suicidal ideas. The patient is nervous/anxious. The patient does not have insomnia.     Objective:  Physical Exam  Constitutional: She is oriented to person, place, and time. She appears well-developed. No distress.  Morbidly obese  HENT:  Head: Normocephalic and atraumatic.  Right Ear: External ear normal.  Left Ear: External ear normal.  Nose: Nose normal.  Mouth/Throat: Oropharynx is clear and moist.  Eyes: Conjunctivae and EOM are normal.  Neck: Normal range of motion. Neck supple.  Cardiovascular: Normal rate, regular rhythm, normal heart sounds and intact distal pulses.  No murmur heard. Respiratory: Effort normal and breath sounds normal. No respiratory distress. She has no wheezes.  GI: Soft. Bowel sounds are normal. She  exhibits no distension. There is no tenderness.  Musculoskeletal:  Extension minus 10 degrees, flexion to 105 degrees.  There is soft tissue swelling present, but no swelling in the joint.  Tenderness over the medial joint line. No tenderness of the lateral joint line.  Neurological: She is alert and oriented to person, place, and time. She has normal strength. No sensory deficit.  Skin: No rash noted. She is not diaphoretic. No erythema.  Psychiatric: She has a normal mood and affect. Her behavior is normal.    Ht: 5 ft 7 in  Wt: 270 lbs  BMI: 42.3  BP: 158/88  Pulse: 84 bpm   Imaging Review Plain radiographs demonstrate severe degenerative joint disease of the left knee(s). The overall alignment ismild varus. The bone quality appears to be good for age and reported activity level.   Preoperative templating of the joint replacement has been completed, documented, and submitted to the Operating Room personnel in order to optimize intra-operative equipment management.   Anticipated LOS equal to or greater than 2 midnights due to - Age 15 and older with one or more of the following:  - Obesity  - Expected need for hospital services (PT, OT, Nursing) required for safe  discharge  - Anticipated need for postoperative skilled nursing care or inpatient rehab  - Active co-morbidities: DVT/VTE, Respiratory Failure/COPD and sleep apnea OR   - Unanticipated findings during/Post Surgery: None  - Patient is a high risk of re-admission due to: None     Assessment/Plan:  End stage primary osteoarthritis, left knee   The patient history, physical examination, clinical judgment of the provider and imaging studies are consistent with end stage degenerative joint disease of the left knee(s) and total knee arthroplasty is deemed medically necessary. The treatment options including medical management, injection therapy arthroscopy and arthroplasty were discussed at length. The risks and benefits  of total knee arthroplasty were presented and reviewed. The risks due to aseptic loosening, infection, stiffness, patella tracking problems, thromboembolic complications and other imponderables were discussed. The patient acknowledged the explanation, agreed to proceed with the plan and consent was signed. Patient is being admitted for inpatient treatment for surgery, pain control, PT, OT, prophylactic antibiotics, VTE prophylaxis, progressive ambulation and ADL's and discharge planning. The patient is planning to be discharged home with outpatient PT.    Therapy Plans: outpatient therapy at [facility] Disposition: Home with husband Planned DVT prophylaxis: Xarelto 10mg  daily DME needed: none   PCP: Dr. Debbrah Alar Other: Topical IV    MULTIPLE ALLERGIES! PLEASE REVIEW!   Ardeen Jourdain, PA-C

## 2017-12-02 MED ORDER — SODIUM CHLORIDE 0.9 % IV SOLN
1000.0000 mg | INTRAVENOUS | Status: AC
  Administered 2017-12-03: 1000 mg via INTRAVENOUS
  Filled 2017-12-02: qty 1100

## 2017-12-02 MED ORDER — VANCOMYCIN HCL 10 G IV SOLR
1500.0000 mg | INTRAVENOUS | Status: AC
  Administered 2017-12-03: 1500 mg via INTRAVENOUS
  Filled 2017-12-02: qty 1500

## 2017-12-03 ENCOUNTER — Inpatient Hospital Stay (HOSPITAL_COMMUNITY)
Admission: RE | Admit: 2017-12-03 | Discharge: 2017-12-05 | DRG: 470 | Disposition: A | Discharge status: Home / Self Care | Payer: 59 | Source: Ambulatory Visit | Attending: Orthopedic Surgery | Admitting: Orthopedic Surgery

## 2017-12-03 ENCOUNTER — Encounter (HOSPITAL_COMMUNITY): Payer: Self-pay | Admitting: *Deleted

## 2017-12-03 ENCOUNTER — Inpatient Hospital Stay (HOSPITAL_COMMUNITY): Payer: 59 | Admitting: Certified Registered Nurse Anesthetist

## 2017-12-03 ENCOUNTER — Encounter (HOSPITAL_COMMUNITY)
Admission: RE | Disposition: A | Discharge status: Home / Self Care | Payer: Self-pay | Source: Ambulatory Visit | Attending: Orthopedic Surgery

## 2017-12-03 ENCOUNTER — Other Ambulatory Visit: Payer: Self-pay

## 2017-12-03 DIAGNOSIS — Z79899 Other long term (current) drug therapy: Secondary | ICD-10-CM

## 2017-12-03 DIAGNOSIS — M1711 Unilateral primary osteoarthritis, right knee: Secondary | ICD-10-CM | POA: Diagnosis present

## 2017-12-03 DIAGNOSIS — Z91030 Bee allergy status: Secondary | ICD-10-CM | POA: Diagnosis not present

## 2017-12-03 DIAGNOSIS — M797 Fibromyalgia: Secondary | ICD-10-CM | POA: Diagnosis present

## 2017-12-03 DIAGNOSIS — M109 Gout, unspecified: Secondary | ICD-10-CM | POA: Diagnosis present

## 2017-12-03 DIAGNOSIS — I10 Essential (primary) hypertension: Secondary | ICD-10-CM | POA: Diagnosis present

## 2017-12-03 DIAGNOSIS — Z91013 Allergy to seafood: Secondary | ICD-10-CM | POA: Diagnosis not present

## 2017-12-03 DIAGNOSIS — Z923 Personal history of irradiation: Secondary | ICD-10-CM

## 2017-12-03 DIAGNOSIS — Z886 Allergy status to analgesic agent status: Secondary | ICD-10-CM

## 2017-12-03 DIAGNOSIS — F329 Major depressive disorder, single episode, unspecified: Secondary | ICD-10-CM | POA: Diagnosis present

## 2017-12-03 DIAGNOSIS — R7303 Prediabetes: Secondary | ICD-10-CM | POA: Diagnosis present

## 2017-12-03 DIAGNOSIS — M1712 Unilateral primary osteoarthritis, left knee: Secondary | ICD-10-CM | POA: Diagnosis not present

## 2017-12-03 DIAGNOSIS — G8918 Other acute postprocedural pain: Secondary | ICD-10-CM | POA: Diagnosis not present

## 2017-12-03 DIAGNOSIS — Z86718 Personal history of other venous thrombosis and embolism: Secondary | ICD-10-CM

## 2017-12-03 DIAGNOSIS — Z881 Allergy status to other antibiotic agents status: Secondary | ICD-10-CM

## 2017-12-03 DIAGNOSIS — K219 Gastro-esophageal reflux disease without esophagitis: Secondary | ICD-10-CM | POA: Diagnosis not present

## 2017-12-03 DIAGNOSIS — Z91041 Radiographic dye allergy status: Secondary | ICD-10-CM | POA: Diagnosis not present

## 2017-12-03 DIAGNOSIS — Z885 Allergy status to narcotic agent status: Secondary | ICD-10-CM

## 2017-12-03 DIAGNOSIS — F419 Anxiety disorder, unspecified: Secondary | ICD-10-CM | POA: Diagnosis present

## 2017-12-03 DIAGNOSIS — G4733 Obstructive sleep apnea (adult) (pediatric): Secondary | ICD-10-CM | POA: Diagnosis present

## 2017-12-03 DIAGNOSIS — Z6841 Body Mass Index (BMI) 40.0 and over, adult: Secondary | ICD-10-CM | POA: Diagnosis not present

## 2017-12-03 DIAGNOSIS — Z853 Personal history of malignant neoplasm of breast: Secondary | ICD-10-CM

## 2017-12-03 DIAGNOSIS — Z888 Allergy status to other drugs, medicaments and biological substances status: Secondary | ICD-10-CM | POA: Diagnosis not present

## 2017-12-03 DIAGNOSIS — E890 Postprocedural hypothyroidism: Secondary | ICD-10-CM | POA: Diagnosis present

## 2017-12-03 DIAGNOSIS — J449 Chronic obstructive pulmonary disease, unspecified: Secondary | ICD-10-CM | POA: Diagnosis not present

## 2017-12-03 DIAGNOSIS — E785 Hyperlipidemia, unspecified: Secondary | ICD-10-CM | POA: Diagnosis present

## 2017-12-03 DIAGNOSIS — M179 Osteoarthritis of knee, unspecified: Secondary | ICD-10-CM | POA: Diagnosis present

## 2017-12-03 DIAGNOSIS — Z91040 Latex allergy status: Secondary | ICD-10-CM | POA: Diagnosis not present

## 2017-12-03 DIAGNOSIS — Z974 Presence of external hearing-aid: Secondary | ICD-10-CM

## 2017-12-03 DIAGNOSIS — Z9221 Personal history of antineoplastic chemotherapy: Secondary | ICD-10-CM

## 2017-12-03 DIAGNOSIS — M810 Age-related osteoporosis without current pathological fracture: Secondary | ICD-10-CM | POA: Diagnosis present

## 2017-12-03 DIAGNOSIS — M1710 Unilateral primary osteoarthritis, unspecified knee: Secondary | ICD-10-CM | POA: Diagnosis present

## 2017-12-03 DIAGNOSIS — M069 Rheumatoid arthritis, unspecified: Secondary | ICD-10-CM | POA: Diagnosis present

## 2017-12-03 DIAGNOSIS — Z85850 Personal history of malignant neoplasm of thyroid: Secondary | ICD-10-CM

## 2017-12-03 HISTORY — PX: TOTAL KNEE ARTHROPLASTY: SHX125

## 2017-12-03 LAB — TYPE AND SCREEN
ABO/RH(D): O POS
ANTIBODY SCREEN: NEGATIVE

## 2017-12-03 SURGERY — ARTHROPLASTY, KNEE, TOTAL
Anesthesia: Monitor Anesthesia Care | Site: Knee | Laterality: Left

## 2017-12-03 MED ORDER — DIPHENHYDRAMINE HCL 12.5 MG/5ML PO ELIX: 12.5000 mg | ORAL_SOLUTION | ORAL | Status: DC | PRN

## 2017-12-03 MED ORDER — ONDANSETRON HCL 4 MG/2ML IJ SOLN
INTRAMUSCULAR | Status: DC | PRN
  Administered 2017-12-03: 4 mg via INTRAVENOUS

## 2017-12-03 MED ORDER — METHOCARBAMOL 500 MG PO TABS
500.0000 mg | ORAL_TABLET | Freq: Four times a day (QID) | ORAL | Status: DC | PRN
  Administered 2017-12-03 – 2017-12-05 (×5): 500 mg via ORAL
  Filled 2017-12-03 (×5): qty 1

## 2017-12-03 MED ORDER — VANCOMYCIN HCL IN DEXTROSE 1-5 GM/200ML-% IV SOLN
1000.0000 mg | Freq: Two times a day (BID) | INTRAVENOUS | Status: AC
  Administered 2017-12-03: 1000 mg via INTRAVENOUS
  Filled 2017-12-03: qty 200

## 2017-12-03 MED ORDER — MIDAZOLAM HCL 2 MG/2ML IJ SOLN
INTRAMUSCULAR | Status: AC
  Administered 2017-12-03: 1 mg via INTRAVENOUS
  Filled 2017-12-03: qty 2

## 2017-12-03 MED ORDER — ROPIVACAINE HCL 7.5 MG/ML IJ SOLN
INTRAMUSCULAR | Status: DC | PRN
  Administered 2017-12-03: 20 mL via PERINEURAL

## 2017-12-03 MED ORDER — FENTANYL CITRATE (PF) 100 MCG/2ML IJ SOLN
INTRAMUSCULAR | Status: AC
  Administered 2017-12-03: 50 ug via INTRAVENOUS
  Filled 2017-12-03: qty 2

## 2017-12-03 MED ORDER — BISACODYL 10 MG RE SUPP: 10.0000 mg | Freq: Every day | RECTAL | Status: DC | PRN

## 2017-12-03 MED ORDER — ONDANSETRON HCL 4 MG/2ML IJ SOLN
INTRAMUSCULAR | Status: AC
Start: 2017-12-03 — End: 2017-12-03
  Filled 2017-12-03: qty 2

## 2017-12-03 MED ORDER — RIVAROXABAN 10 MG PO TABS
10.0000 mg | ORAL_TABLET | Freq: Every day | ORAL | Status: DC
  Administered 2017-12-04 – 2017-12-05 (×2): 10 mg via ORAL
  Filled 2017-12-03 (×2): qty 1

## 2017-12-03 MED ORDER — MIDAZOLAM HCL 2 MG/2ML IJ SOLN
1.0000 mg | INTRAMUSCULAR | Status: DC
  Administered 2017-12-03: 1 mg via INTRAVENOUS

## 2017-12-03 MED ORDER — METOCLOPRAMIDE HCL 5 MG PO TABS: 5.0000 mg | ORAL_TABLET | Freq: Three times a day (TID) | ORAL | Status: DC | PRN

## 2017-12-03 MED ORDER — ATORVASTATIN CALCIUM 20 MG PO TABS
20.0000 mg | ORAL_TABLET | Freq: Every day | ORAL | Status: DC
  Administered 2017-12-04 – 2017-12-05 (×2): 20 mg via ORAL
  Filled 2017-12-03 (×2): qty 1

## 2017-12-03 MED ORDER — ONDANSETRON HCL 4 MG/2ML IJ SOLN
4.0000 mg | Freq: Once | INTRAMUSCULAR | Status: AC | PRN
  Administered 2017-12-03: 4 mg via INTRAVENOUS

## 2017-12-03 MED ORDER — DEXAMETHASONE SODIUM PHOSPHATE 10 MG/ML IJ SOLN
INTRAMUSCULAR | Status: AC
  Filled 2017-12-03: qty 1

## 2017-12-03 MED ORDER — ALBUTEROL SULFATE (5 MG/ML) 0.5% IN NEBU: 2.5000 mg | INHALATION_SOLUTION | Freq: Four times a day (QID) | RESPIRATORY_TRACT | Status: DC | PRN

## 2017-12-03 MED ORDER — METOCLOPRAMIDE HCL 5 MG/ML IJ SOLN: 5.0000 mg | Freq: Three times a day (TID) | INTRAMUSCULAR | Status: DC | PRN

## 2017-12-03 MED ORDER — AMLODIPINE BESYLATE 5 MG PO TABS
5.0000 mg | ORAL_TABLET | Freq: Every day | ORAL | Status: DC
  Administered 2017-12-04 – 2017-12-05 (×2): 5 mg via ORAL
  Filled 2017-12-03 (×2): qty 1

## 2017-12-03 MED ORDER — ALBUTEROL SULFATE (2.5 MG/3ML) 0.083% IN NEBU
2.5000 mg | INHALATION_SOLUTION | Freq: Four times a day (QID) | RESPIRATORY_TRACT | Status: DC | PRN
Start: 2017-12-03 — End: 2017-12-05
  Administered 2017-12-04: 2.5 mg via RESPIRATORY_TRACT
  Filled 2017-12-03: qty 3

## 2017-12-03 MED ORDER — 0.9 % SODIUM CHLORIDE (POUR BTL) OPTIME
TOPICAL | Status: DC | PRN
  Administered 2017-12-03: 1000 mL

## 2017-12-03 MED ORDER — PROPOFOL 10 MG/ML IV BOLUS
INTRAVENOUS | Status: AC
  Filled 2017-12-03: qty 40

## 2017-12-03 MED ORDER — OXYCODONE HCL 5 MG PO TABS
10.0000 mg | ORAL_TABLET | ORAL | Status: DC | PRN
  Administered 2017-12-04 – 2017-12-05 (×2): 10 mg via ORAL
  Filled 2017-12-03: qty 2

## 2017-12-03 MED ORDER — FENTANYL CITRATE (PF) 100 MCG/2ML IJ SOLN
50.0000 ug | INTRAMUSCULAR | Status: DC
  Administered 2017-12-03: 50 ug via INTRAVENOUS

## 2017-12-03 MED ORDER — ANASTROZOLE 1 MG PO TABS
1.0000 mg | ORAL_TABLET | Freq: Every day | ORAL | Status: DC
  Administered 2017-12-04: 1 mg via ORAL
  Filled 2017-12-03 (×2): qty 1

## 2017-12-03 MED ORDER — FLUTICASONE FUROATE-VILANTEROL 200-25 MCG/INH IN AEPB
1.0000 | INHALATION_SPRAY | Freq: Every day | RESPIRATORY_TRACT | Status: DC
  Administered 2017-12-04 – 2017-12-05 (×2): 1 via RESPIRATORY_TRACT
  Filled 2017-12-03: qty 28

## 2017-12-03 MED ORDER — OXYCODONE HCL 5 MG PO TABS
5.0000 mg | ORAL_TABLET | ORAL | Status: DC | PRN
  Administered 2017-12-03: 5 mg via ORAL
  Administered 2017-12-03 – 2017-12-05 (×5): 10 mg via ORAL
  Filled 2017-12-03 (×5): qty 2
  Filled 2017-12-03: qty 1
  Filled 2017-12-03: qty 2

## 2017-12-03 MED ORDER — ACETAMINOPHEN 500 MG PO TABS
1000.0000 mg | ORAL_TABLET | Freq: Four times a day (QID) | ORAL | Status: AC
  Administered 2017-12-03 – 2017-12-04 (×4): 1000 mg via ORAL
  Filled 2017-12-03 (×5): qty 2

## 2017-12-03 MED ORDER — PROPOFOL 500 MG/50ML IV EMUL
INTRAVENOUS | Status: DC | PRN
  Administered 2017-12-03: 75 ug/kg/min via INTRAVENOUS

## 2017-12-03 MED ORDER — DEXAMETHASONE SODIUM PHOSPHATE 10 MG/ML IJ SOLN
10.0000 mg | Freq: Once | INTRAMUSCULAR | Status: AC
  Administered 2017-12-04: 10 mg via INTRAVENOUS
  Filled 2017-12-03: qty 1

## 2017-12-03 MED ORDER — DULOXETINE HCL 60 MG PO CPEP
60.0000 mg | ORAL_CAPSULE | Freq: Every day | ORAL | Status: DC
  Administered 2017-12-04 – 2017-12-05 (×2): 60 mg via ORAL
  Filled 2017-12-03 (×2): qty 1

## 2017-12-03 MED ORDER — PHENOL 1.4 % MT LIQD: 1.0000 | OROMUCOSAL | Status: DC | PRN

## 2017-12-03 MED ORDER — PHENYLEPHRINE HCL 10 MG/ML IJ SOLN
INTRAMUSCULAR | Status: AC
  Filled 2017-12-03: qty 1

## 2017-12-03 MED ORDER — POLYETHYLENE GLYCOL 3350 17 G PO PACK: 17.0000 g | PACK | Freq: Every day | ORAL | Status: DC | PRN

## 2017-12-03 MED ORDER — PROPOFOL 10 MG/ML IV BOLUS
INTRAVENOUS | Status: AC
  Filled 2017-12-03: qty 20

## 2017-12-03 MED ORDER — NITROGLYCERIN 0.4 MG SL SUBL: 0.4000 mg | SUBLINGUAL_TABLET | SUBLINGUAL | Status: DC | PRN

## 2017-12-03 MED ORDER — LACTATED RINGERS IV SOLN
INTRAVENOUS | Status: DC
  Administered 2017-12-03: 08:00:00 via INTRAVENOUS

## 2017-12-03 MED ORDER — DOCUSATE SODIUM 100 MG PO CAPS
100.0000 mg | ORAL_CAPSULE | Freq: Two times a day (BID) | ORAL | Status: DC
  Administered 2017-12-03 – 2017-12-05 (×4): 100 mg via ORAL
  Filled 2017-12-03 (×4): qty 1

## 2017-12-03 MED ORDER — BUPIVACAINE IN DEXTROSE 0.75-8.25 % IT SOLN
INTRATHECAL | Status: DC | PRN
  Administered 2017-12-03: 1.8 mL via INTRATHECAL

## 2017-12-03 MED ORDER — STERILE WATER FOR IRRIGATION IR SOLN
Status: DC | PRN
Start: 2017-12-03 — End: 2017-12-03
  Administered 2017-12-03: 2000 mL

## 2017-12-03 MED ORDER — ONDANSETRON HCL 4 MG PO TABS
4.0000 mg | ORAL_TABLET | Freq: Four times a day (QID) | ORAL | Status: DC | PRN
  Administered 2017-12-04: 4 mg via ORAL
  Filled 2017-12-03: qty 1

## 2017-12-03 MED ORDER — ACETAMINOPHEN 10 MG/ML IV SOLN
1000.0000 mg | Freq: Once | INTRAVENOUS | Status: AC
  Administered 2017-12-03: 1000 mg via INTRAVENOUS
  Filled 2017-12-03: qty 100

## 2017-12-03 MED ORDER — SODIUM CHLORIDE 0.9 % IR SOLN
Status: DC | PRN
  Administered 2017-12-03: 1000 mL

## 2017-12-03 MED ORDER — HYDROMORPHONE HCL 1 MG/ML IJ SOLN: 0.5000 mg | INTRAMUSCULAR | Status: DC | PRN

## 2017-12-03 MED ORDER — LEVOTHYROXINE SODIUM 150 MCG PO TABS
150.0000 ug | ORAL_TABLET | Freq: Every day | ORAL | Status: DC
  Administered 2017-12-04 – 2017-12-05 (×2): 150 ug via ORAL
  Filled 2017-12-03 (×2): qty 2
  Filled 2017-12-03 (×2): qty 1

## 2017-12-03 MED ORDER — ONDANSETRON HCL 4 MG/2ML IJ SOLN
4.0000 mg | Freq: Four times a day (QID) | INTRAMUSCULAR | Status: DC | PRN
  Administered 2017-12-03: 4 mg via INTRAVENOUS
  Filled 2017-12-03: qty 2

## 2017-12-03 MED ORDER — HYDROMORPHONE HCL 1 MG/ML IJ SOLN
INTRAMUSCULAR | Status: AC
  Filled 2017-12-03: qty 1

## 2017-12-03 MED ORDER — CLONIDINE HCL (ANALGESIA) 100 MCG/ML EP SOLN
EPIDURAL | Status: DC | PRN
  Administered 2017-12-03: 50 ug

## 2017-12-03 MED ORDER — GABAPENTIN 300 MG PO CAPS
300.0000 mg | ORAL_CAPSULE | Freq: Three times a day (TID) | ORAL | Status: DC
  Administered 2017-12-03 – 2017-12-05 (×6): 300 mg via ORAL
  Filled 2017-12-03 (×6): qty 1

## 2017-12-03 MED ORDER — TOPIRAMATE 25 MG PO TABS
50.0000 mg | ORAL_TABLET | Freq: Every day | ORAL | Status: DC
  Administered 2017-12-04 – 2017-12-05 (×2): 50 mg via ORAL
  Filled 2017-12-03 (×2): qty 2

## 2017-12-03 MED ORDER — HYDROMORPHONE HCL 1 MG/ML IJ SOLN
0.2500 mg | INTRAMUSCULAR | Status: DC | PRN
Start: 2017-12-03 — End: 2017-12-03
  Administered 2017-12-03: 0.5 mg via INTRAVENOUS

## 2017-12-03 MED ORDER — MIRABEGRON ER 25 MG PO TB24
50.0000 mg | ORAL_TABLET | Freq: Every day | ORAL | Status: DC
  Administered 2017-12-04 – 2017-12-05 (×2): 50 mg via ORAL
  Filled 2017-12-03 (×2): qty 2

## 2017-12-03 MED ORDER — METHOCARBAMOL 1000 MG/10ML IJ SOLN
500.0000 mg | Freq: Four times a day (QID) | INTRAVENOUS | Status: DC | PRN
  Administered 2017-12-03: 500 mg via INTRAVENOUS
  Filled 2017-12-03: qty 550

## 2017-12-03 MED ORDER — CHLORHEXIDINE GLUCONATE 4 % EX LIQD: 60.0000 mL | Freq: Once | CUTANEOUS | Status: DC

## 2017-12-03 MED ORDER — BUPIVACAINE HCL (PF) 0.25 % IJ SOLN
INTRAMUSCULAR | Status: AC
  Filled 2017-12-03: qty 30

## 2017-12-03 MED ORDER — GABAPENTIN 300 MG PO CAPS
300.0000 mg | ORAL_CAPSULE | Freq: Once | ORAL | Status: AC
  Administered 2017-12-03: 300 mg via ORAL
  Filled 2017-12-03: qty 1

## 2017-12-03 MED ORDER — SODIUM CHLORIDE 0.9 % IV SOLN
INTRAVENOUS | Status: DC
  Administered 2017-12-03 – 2017-12-04 (×2): via INTRAVENOUS

## 2017-12-03 MED ORDER — ONDANSETRON HCL 4 MG/2ML IJ SOLN
INTRAMUSCULAR | Status: AC
  Filled 2017-12-03: qty 2

## 2017-12-03 MED ORDER — MENTHOL 3 MG MT LOZG: 1.0000 | LOZENGE | OROMUCOSAL | Status: DC | PRN

## 2017-12-03 MED ORDER — FLEET ENEMA 7-19 GM/118ML RE ENEM: 1.0000 | ENEMA | Freq: Once | RECTAL | Status: DC | PRN

## 2017-12-03 SURGICAL SUPPLY — 45 items
BAG ZIPLOCK 12X15 (MISCELLANEOUS) ×3 IMPLANT
BANDAGE ACE 6X5 VEL STRL LF (GAUZE/BANDAGES/DRESSINGS) ×3 IMPLANT
BLADE SAG 18X100X1.27 (BLADE) ×3 IMPLANT
BLADE SAW SGTL 11.0X1.19X90.0M (BLADE) ×3 IMPLANT
BOWL SMART MIX CTS (DISPOSABLE) ×3 IMPLANT
CAPT KNEE TOTAL 3 ATTUNE ×3 IMPLANT
CEMENT HV SMART SET (Cement) ×6 IMPLANT
CLOSURE WOUND 1/2 X4 (GAUZE/BANDAGES/DRESSINGS) ×1
COVER SURGICAL LIGHT HANDLE (MISCELLANEOUS) ×3 IMPLANT
CUFF TOURN SGL QUICK 34 (TOURNIQUET CUFF) ×2
CUFF TRNQT CYL 34X4X40X1 (TOURNIQUET CUFF) ×1 IMPLANT
DECANTER SPIKE VIAL GLASS SM (MISCELLANEOUS) ×3 IMPLANT
DRAPE U-SHAPE 47X51 STRL (DRAPES) ×3 IMPLANT
DRSG ADAPTIC 3X8 NADH LF (GAUZE/BANDAGES/DRESSINGS) ×3 IMPLANT
DRSG PAD ABDOMINAL 8X10 ST (GAUZE/BANDAGES/DRESSINGS) ×3 IMPLANT
DURAPREP 26ML APPLICATOR (WOUND CARE) ×3 IMPLANT
ELECT REM PT RETURN 15FT ADLT (MISCELLANEOUS) ×3 IMPLANT
EVACUATOR 1/8 PVC DRAIN (DRAIN) ×3 IMPLANT
GAUZE SPONGE 4X4 12PLY STRL (GAUZE/BANDAGES/DRESSINGS) ×3 IMPLANT
GLOVE BIOGEL PI IND STRL 8 (GLOVE) ×1 IMPLANT
GLOVE BIOGEL PI INDICATOR 8 (GLOVE) ×2
GLOVE SURG SS PI 6.5 STRL IVOR (GLOVE) ×9 IMPLANT
GLOVE SURG SS PI 8.0 STRL IVOR (GLOVE) ×12 IMPLANT
GOWN STRL REUS W/TWL LRG LVL3 (GOWN DISPOSABLE) ×6 IMPLANT
HANDPIECE INTERPULSE COAX TIP (DISPOSABLE) ×2
HOLDER FOLEY CATH W/STRAP (MISCELLANEOUS) IMPLANT
IMMOBILIZER KNEE 20 (SOFTGOODS) ×6 IMPLANT
IMMOBILIZER KNEE 20 THIGH 36 (SOFTGOODS) ×1 IMPLANT
MANIFOLD NEPTUNE II (INSTRUMENTS) ×3 IMPLANT
NS IRRIG 1000ML POUR BTL (IV SOLUTION) ×3 IMPLANT
PACK TOTAL KNEE CUSTOM (KITS) ×3 IMPLANT
PAD ABD 8X10 STRL (GAUZE/BANDAGES/DRESSINGS) ×3 IMPLANT
PADDING CAST COTTON 6X4 STRL (CAST SUPPLIES) ×3 IMPLANT
POSITIONER SURGICAL ARM (MISCELLANEOUS) ×3 IMPLANT
SET HNDPC FAN SPRY TIP SCT (DISPOSABLE) ×1 IMPLANT
STRIP CLOSURE SKIN 1/2X4 (GAUZE/BANDAGES/DRESSINGS) ×2 IMPLANT
SUT MNCRL AB 4-0 PS2 18 (SUTURE) ×3 IMPLANT
SUT STRATAFIX 0 PDS 27 VIOLET (SUTURE) ×3
SUT VIC AB 2-0 CT1 27 (SUTURE) ×6
SUT VIC AB 2-0 CT1 TAPERPNT 27 (SUTURE) ×3 IMPLANT
SUTURE STRATFX 0 PDS 27 VIOLET (SUTURE) ×1 IMPLANT
TRAY FOLEY BAG SILVER LF 16FR (CATHETERS) ×3 IMPLANT
WATER STERILE IRR 1000ML POUR (IV SOLUTION) ×6 IMPLANT
WRAP KNEE MAXI GEL POST OP (GAUZE/BANDAGES/DRESSINGS) ×3 IMPLANT
YANKAUER SUCT BULB TIP 10FT TU (MISCELLANEOUS) ×3 IMPLANT

## 2017-12-03 NOTE — Progress Notes (Signed)
Assisted Dr. Rob Fitzgerald with left, ultrasound guided, adductor canal block. Side rails up, monitors on throughout procedure. See vital signs in flow sheet. Tolerated Procedure well.  

## 2017-12-03 NOTE — Anesthesia Preprocedure Evaluation (Addendum)
Anesthesia Evaluation  Patient identified by MRN, date of birth, ID band Patient awake    Reviewed: Allergy & Precautions, NPO status , Patient's Chart, lab work & pertinent test results  Airway Mallampati: III  TM Distance: >3 FB Neck ROM: Full  Mouth opening: Limited Mouth Opening  Dental   Pulmonary asthma , sleep apnea , COPD,    breath sounds clear to auscultation       Cardiovascular hypertension, Pt. on medications  Rhythm:Regular Rate:Normal     Neuro/Psych  Headaches, Anxiety Depression  Neuromuscular disease    GI/Hepatic Neg liver ROS, GERD  ,  Endo/Other  Hypothyroidism Morbid obesity  Renal/GU negative Renal ROS     Musculoskeletal  (+) Arthritis , Fibromyalgia -  Abdominal   Peds  Hematology negative hematology ROS (+)   Anesthesia Other Findings   Reproductive/Obstetrics                            Lab Results  Component Value Date   WBC 6.4 11/27/2017   HGB 14.9 11/27/2017   HCT 45.9 11/27/2017   MCV 79.4 11/27/2017   PLT 363 11/27/2017   Lab Results  Component Value Date   CREATININE 0.85 11/27/2017   BUN 12 11/27/2017   NA 141 11/27/2017   K 4.3 11/27/2017   CL 105 11/27/2017   CO2 27 11/27/2017   Lab Results  Component Value Date   INR 1.01 11/27/2017   INR 0.9 09/10/2017    Anesthesia Physical Anesthesia Plan  ASA: III  Anesthesia Plan: MAC and Spinal   Post-op Pain Management:  Regional for Post-op pain   Induction: Intravenous  PONV Risk Score and Plan: 2 and Propofol infusion, Ondansetron and Treatment may vary due to age or medical condition  Airway Management Planned: Natural Airway  Additional Equipment:   Intra-op Plan:   Post-operative Plan:   Informed Consent: I have reviewed the patients History and Physical, chart, labs and discussed the procedure including the risks, benefits and alternatives for the proposed anesthesia with the  patient or authorized representative who has indicated his/her understanding and acceptance.     Plan Discussed with: CRNA  Anesthesia Plan Comments:         Anesthesia Quick Evaluation

## 2017-12-03 NOTE — Op Note (Signed)
OPERATIVE REPORT-TOTAL KNEE ARTHROPLASTY   Pre-operative diagnosis- Osteoarthritis  Left knee(s)  Post-operative diagnosis- Osteoarthritis Left knee(s)  Procedure-  Left  Total Knee Arthroplasty (Depuy Attune)  Surgeon- Dione Plover. Zayvian Mcmurtry, MD  Assistant- Molli Barrows, PA-C   Anesthesia-  Adductor canal block and spinal  EBL 25 ml   Drains Hemovac  Tourniquet time-  Total Tourniquet Time Documented: Thigh (Left) - 52 minutes Total: Thigh (Left) - 52 minutes     Complications- None  Condition-PACU - hemodynamically stable.   Brief Clinical Note  Michelle Kennedy is a 62 y.o. year old female with end stage OA of her left knee with progressively worsening pain and dysfunction. She has constant pain, with activity and at rest and significant functional deficits with difficulties even with ADLs. She has had extensive non-op management including analgesics, injections of cortisone and viscosupplements, and home exercise program, but remains in significant pain with significant dysfunction. Radiographs show bone on bone arthritis medial and patellofemoral. She presents now for left Total Knee Arthroplasty.    Procedure in detail---   The patient is brought into the operating room and positioned supine on the operating table. After successful administration of  Adductor canal block and spinal,   a tourniquet is placed high on the  Left thigh(s) and the lower extremity is prepped and draped in the usual sterile fashion. Time out is performed by the operating team and then the  Left  lower extremity is wrapped in Esmarch, knee flexed and the tourniquet inflated to 300 mmHg.       A midline incision is made with a ten blade through the subcutaneous tissue to the level of the extensor mechanism. A fresh blade is used to make a medial parapatellar arthrotomy. Soft tissue over the proximal medial tibia is subperiosteally elevated to the joint line with a knife and into the semimembranosus bursa  with a Cobb elevator. Soft tissue over the proximal lateral tibia is elevated with attention being paid to avoiding the patellar tendon on the tibial tubercle. The patella is everted, knee flexed 90 degrees and the ACL and PCL are removed. Findings are bone on bone medial and patellofemoral with large global osteophytes.        The drill is used to create a starting hole in the distal femur and the canal is thoroughly irrigated with sterile saline to remove the fatty contents. The 5 degree Left  valgus alignment guide is placed into the femoral canal and the distal femoral cutting block is pinned to remove 9 mm off the distal femur. Resection is made with an oscillating saw.      The tibia is subluxed forward and the menisci are removed. The extramedullary alignment guide is placed referencing proximally at the medial aspect of the tibial tubercle and distally along the second metatarsal axis and tibial crest. The block is pinned to remove 69mm off the more deficient medial  side. Resection is made with an oscillating saw. Size 6is the most appropriate size for the tibia and the proximal tibia is prepared with the modular drill and keel punch for that size.      The femoral sizing guide is placed and size 7 is most appropriate. Rotation is marked off the epicondylar axis and confirmed by creating a rectangular flexion gap at 90 degrees. The size 7 cutting block is pinned in this rotation and the anterior, posterior and chamfer cuts are made with the oscillating saw. The intercondylar block is then placed and that  cut is made.      Trial size 6 tibial component, trial size 7 posterior stabilized femur and a 8  mm posterior stabilized rotating platform insert trial is placed. Full extension is achieved with excellent varus/valgus and anterior/posterior balance throughout full range of motion. The patella is everted and thickness measured to be 22  mm. Free hand resection is taken to 12 mm, a 38 template is placed,  lug holes are drilled, trial patella is placed, and it tracks normally. Osteophytes are removed off the posterior femur with the trial in place. All trials are removed and the cut bone surfaces prepared with pulsatile lavage. Cement is mixed and once ready for implantation, the size 6 tibial implant, size  7 posterior stabilized femoral component, and the size 38 patella are cemented in place and the patella is held with the clamp. The trial insert is placed and the knee held in full extension. All extruded cement is removed and once the cement is hard the permanent 8 mm posterior stabilized rotating platform insert is placed into the tibial tray.      The wound is copiously irrigated with saline solution and the extensor mechanism closed over a hemovac drain with #1 V-loc suture. The tourniquet is released for a total tourniquet time of 52  minutes. Flexion against gravity is 130 degrees and the patella tracks normally. Subcutaneous tissue is closed with 2.0 vicryl and subcuticular with running 4.0 Monocryl. The incision is cleaned and dried and steri-strips and a bulky sterile dressing are applied. The limb is placed into a knee immobilizer and the patient is awakened and transported to recovery in stable condition.      Please note that a surgical assistant was a medical necessity for this procedure in order to perform it in a safe and expeditious manner. Surgical assistant was necessary to retract the ligaments and vital neurovascular structures to prevent injury to them and also necessary for proper positioning of the limb to allow for anatomic placement of the prosthesis.   [name], MD    12/03/2017, 10:40 AM

## 2017-12-03 NOTE — Evaluation (Signed)
Physical Therapy Evaluation Patient Details Name: Michelle Kennedy MRN: [id number] DOB: 01/17/1956 Today's Date: 12/03/2017   History of Present Illness  L TKA, h/o vertigo  Clinical Impression  The patient ambulated x 40' today.  Plans DC home with OPPT. Pt admitted with above diagnosis. Pt currently with functional limitations due to the deficits listed below (see PT Problem List).  Pt will benefit from skilled PT to increase their independence and safety with mobility to allow discharge to the venue listed below.       Follow Up Recommendations Follow surgeon's recommendation for DC plan and follow-up therapies;Outpatient PT    Equipment Recommendations  None recommended by PT    Recommendations for Other Services       Precautions / Restrictions Precautions Precautions: Knee;Fall Required Braces or Orthoses: Knee Immobilizer - Left      Mobility  Bed Mobility Overal bed mobility: Needs Assistance Bed Mobility: Supine to Sit     Supine to sit: Min assist     General bed mobility comments: support left leg  Transfers Overall transfer level: Needs assistance Equipment used: Rolling walker (2 wheeled) Transfers: Sit to/from Stand Sit to Stand: Min assist         General transfer comment: cues for hand and left leg position  Ambulation/Gait Ambulation/Gait assistance: Min assist;+2 safety/equipment Gait Distance (Feet): 40 Feet Assistive device: Rolling walker (2 wheeled) Gait Pattern/deviations: Step-to pattern;Antalgic;Decreased step length - left;Decreased stance time - left     General Gait Details: cues for sequence, c/o mild diziness at times.  Stairs            Wheelchair Mobility    Modified Rankin (Stroke Patients Only)       Balance                                             Pertinent Vitals/Pain Pain Assessment: 0-10 Pain Score: 5  Pain Location: left knee Pain Descriptors / Indicators:  Aching;Discomfort Pain Intervention(s): Premedicated before session;Monitored during session;Limited activity within patient's tolerance;Ice applied    Home Living Family/patient expects to be discharged to:: Private residence Living Arrangements: Spouse/significant other Available Help at Discharge: Family Type of Home: House Home Access: Stairs to enter   Technical brewer of Steps: 1 Home Layout: One level Home Equipment: Environmental consultant - 2 wheels;Bedside commode      Prior Function Level of Independence: Independent               Hand Dominance        Extremity/Trunk Assessment   Upper Extremity Assessment Upper Extremity Assessment: Overall WFL for tasks assessed    Lower Extremity Assessment Lower Extremity Assessment: LLE deficits/detail LLE Deficits / Details: able to perform SLR    Cervical / Trunk Assessment Cervical / Trunk Assessment: Normal  Communication   Communication: No difficulties  Cognition Arousal/Alertness: Awake/alert Behavior During Therapy: WFL for tasks assessed/performed Overall Cognitive Status: Within Functional Limits for tasks assessed                                        General Comments      Exercises     Assessment/Plan    PT Assessment Patient needs continued PT services  PT Problem List Decreased strength;Decreased range of motion;Decreased activity tolerance;Decreased  knowledge of use of DME;Decreased safety awareness;Decreased mobility;Pain       PT Treatment Interventions DME instruction;Therapeutic exercise;Gait training;Stair training;Functional mobility training;Therapeutic activities;Patient/family education    PT Goals (Current goals can be found in the Care Plan section)  Acute Rehab PT Goals Patient Stated Goal: go home, PT Goal Formulation: With patient/family Time For Goal Achievement: 12/07/17 Potential to Achieve Goals: Good    Frequency 7X/week   Barriers to discharge         Co-evaluation               AM-PAC PT "6 Clicks" Daily Activity  Outcome Measure Difficulty turning over in bed (including adjusting bedclothes, sheets and blankets)?: A Lot Difficulty moving from lying on back to sitting on the side of the bed? : A Lot Difficulty sitting down on and standing up from a chair with arms (e.g., wheelchair, bedside commode, etc,.)?: A Lot Help needed moving to and from a bed to chair (including a wheelchair)?: A Lot Help needed walking in hospital room?: A Lot Help needed climbing 3-5 steps with a railing? : Total 6 Click Score: 11    End of Session Equipment Utilized During Treatment: Gait belt;Left knee immobilizer Activity Tolerance: Patient tolerated treatment well Patient left: in chair;with call bell/phone within reach;with family/visitor present;with chair alarm set   PT Visit Diagnosis: Unsteadiness on feet (R26.81);Pain Pain - Right/Left: Left Pain - part of body: Knee    Time: 1551-1620 PT Time Calculation (min) (ACUTE ONLY): 29 min   Charges:   PT Evaluation $PT Eval Low Complexity: 1 Low PT Treatments $Gait Training: 8-22 mins   PT G CodesTresa Endo PT [phone]   Claretha Cooper 12/03/2017, 6:17 PM

## 2017-12-03 NOTE — Anesthesia Postprocedure Evaluation (Signed)
Anesthesia Post Note  Patient: Michelle Kennedy  Procedure(s) Performed: LEFT TOTAL KNEE ARTHROPLASTY (Left Knee)     Patient location during evaluation: PACU Anesthesia Type: Spinal Level of consciousness: awake and alert Pain management: pain level controlled Vital Signs Assessment: post-procedure vital signs reviewed and stable Respiratory status: spontaneous breathing and respiratory function stable Cardiovascular status: blood pressure returned to baseline and stable Postop Assessment: spinal receding Anesthetic complications: no    Last Vitals:  Vitals:   12/03/17 1130 12/03/17 1145  BP: 127/83 126/87  Pulse: 68 61  Resp: 15 16  Temp: (!) 36.4 C 36.4 C  SpO2: 100% 96%    Last Pain:  Vitals:   12/03/17 1145  TempSrc:   PainSc: 2                  Tiajuana Amass

## 2017-12-03 NOTE — Interval H&P Note (Signed)
History and Physical Interval Note:  12/03/2017 7:11 AM  Michelle Kennedy  has presented today for surgery, with the diagnosis of Osteoarthritis Left Knee  The various methods of treatment have been discussed with the patient and family. After consideration of risks, benefits and other options for treatment, the patient has consented to  Procedure(s): LEFT TOTAL KNEE ARTHROPLASTY (Left) as a surgical intervention .  The patient's history has been reviewed, patient examined, no change in status, stable for surgery.  I have reviewed the patient's chart and labs.  Questions were answered to the patient's satisfaction.     Pilar Plate Courney Garrod

## 2017-12-03 NOTE — Transfer of Care (Signed)
Immediate Anesthesia Transfer of Care Note  Patient: Michelle Kennedy  Procedure(s) Performed: LEFT TOTAL KNEE ARTHROPLASTY (Left Knee)  Patient Location: PACU  Anesthesia Type:Regional and Spinal  Level of Consciousness: awake, alert  and oriented  Airway & Oxygen Therapy: Patient Spontanous Breathing and Patient connected to face mask oxygen  Post-op Assessment: Report given to RN and Post -op Vital signs reviewed and stable  Post vital signs: Reviewed and stable  Last Vitals:  Vitals Value Taken Time  BP    Temp    Pulse 76 12/03/2017 11:08 AM  Resp 15 12/03/2017 11:08 AM  SpO2 100 % 12/03/2017 11:08 AM  Vitals shown include unvalidated device data.  Last Pain:  Vitals:   12/03/17 0743  TempSrc:   PainSc: 6       Patients Stated Pain Goal: 5 (39/03/00 9233)  Complications: No apparent anesthesia complications

## 2017-12-03 NOTE — Anesthesia Procedure Notes (Signed)
Spinal  Patient location during procedure: OR End time: 12/03/2017 9:15 AM Staffing Anesthesiologist: Suzette Battiest, MD Resident/CRNA: Maxwell Caul, CRNA Performed: resident/CRNA  Preanesthetic Checklist Completed: patient identified, site marked, surgical consent, pre-op evaluation, timeout performed, IV checked, risks and benefits discussed and monitors and equipment checked Spinal Block Patient position: sitting Prep: ChloraPrep Patient monitoring: heart rate, continuous pulse ox and blood pressure Approach: midline Location: L3-4 Injection technique: single-shot Needle Needle type: Pencan  Needle gauge: 24 G Needle length: 10 cm Additional Notes Patient sitting for SAB placement. Negative heme/paresthesia. Patient tolerated well. Dr Ola Spurr at bedside during entire placement. LOT # [id number]. Expiration date 11-20-2018.

## 2017-12-03 NOTE — Discharge Instructions (Addendum)
° °Dr. Frank Aluisio °Total Joint Specialist °[facility] °[address] °[place], [place] [address] °[phone] ° °TOTAL KNEE REPLACEMENT POSTOPERATIVE DIRECTIONS ° °Knee Rehabilitation, Guidelines Following Surgery  °Results after knee surgery are often greatly improved when you follow the exercise, range of motion and muscle strengthening exercises prescribed by your doctor. Safety measures are also important to protect the knee from further injury. Any time any of these exercises cause you to have increased pain or swelling in your knee joint, decrease the amount until you are comfortable again and slowly increase them. If you have problems or questions, call your caregiver or physical therapist for advice.  ° °HOME CARE INSTRUCTIONS  °Remove items at home which could result in a fall. This includes throw rugs or furniture in walking pathways.  °· ICE to the affected knee every three hours for 30 minutes at a time and then as needed for pain and swelling.  Continue to use ice on the knee for pain and swelling from surgery. You may notice swelling that will progress down to the foot and ankle.  This is normal after surgery.  Elevate the leg when you are not up walking on it.   °· Continue to use the breathing machine which will help keep your temperature down.  It is common for your temperature to cycle up and down following surgery, especially at night when you are not up moving around and exerting yourself.  The breathing machine keeps your lungs expanded and your temperature down. °· Do not place pillow under knee, focus on keeping the knee straight while resting ° °DIET °You may resume your previous home diet once your are discharged from the hospital. ° °DRESSING / WOUND CARE / SHOWERING °You may change your dressing every day with sterile gauze.  Please use good hand washing techniques before changing the dressing.  Do not use any lotions or creams on the incision until instructed by your  surgeon. °You may start showering once you are discharged home but do not submerge the incision under water. Just pat the incision dry and apply a dry gauze dressing on daily. °Change the surgical dressing daily and reapply a dry dressing each time. ° °ACTIVITY °Walk with your walker as instructed. °Use walker as long as suggested by your caregivers. °Avoid periods of inactivity such as sitting longer than an hour when not asleep. This helps prevent blood clots.  °You may resume a sexual relationship in one month or when given the OK by your doctor.  °You may return to work once you are cleared by your doctor.  °Do not drive a car for 6 weeks or until released by you surgeon.  °Do not drive while taking narcotics. ° °WEIGHT BEARING °Weight bearing as tolerated with assist device (walker, cane, etc) as directed, use it as long as suggested by your surgeon or therapist, typically at least 4-6 weeks. ° °POSTOPERATIVE CONSTIPATION PROTOCOL °Constipation - defined medically as fewer than three stools per week and severe constipation as less than one stool per week. ° °One of the most common issues patients have following surgery is constipation.  Even if you have a regular bowel pattern at home, your normal regimen is likely to be disrupted due to multiple reasons following surgery.  Combination of anesthesia, postoperative narcotics, change in appetite and fluid intake all can affect your bowels.  In order to avoid complications following surgery, here are some recommendations in order to help you during your recovery period. ° °  Colace (docusate) - Pick up an over-the-counter form of Colace or another stool softener and take twice a day as long as you are requiring postoperative pain medications.  Take with a full glass of water daily.  If you experience loose stools or diarrhea, hold the colace until you stool forms back up.  If your symptoms do not get better within 1 week or if they get worse, check with your  doctor. ° °Dulcolax (bisacodyl) - Pick up over-the-counter and take as directed by the product packaging as needed to assist with the movement of your bowels.  Take with a full glass of water.  Use this product as needed if not relieved by Colace only.  ° °MiraLax (polyethylene glycol) - Pick up over-the-counter to have on hand.  MiraLax is a solution that will increase the amount of water in your bowels to assist with bowel movements.  Take as directed and can mix with a glass of water, juice, soda, coffee, or tea.  Take if you go more than two days without a movement. °Do not use MiraLax more than once per day. Call your doctor if you are still constipated or irregular after using this medication for 7 days in a row. ° °If you continue to have problems with postoperative constipation, please contact the office for further assistance and recommendations.  If you experience "the worst abdominal pain ever" or develop nausea or vomiting, please contact the office immediatly for further recommendations for treatment. ° °ITCHING ° If you experience itching with your medications, try taking only a single pain pill, or even half a pain pill at a time.  You can also use Benadryl over the counter for itching or also to help with sleep.  ° °TED HOSE STOCKINGS °Wear the elastic stockings on both legs for three weeks following surgery during the day but you may remove then at night for sleeping. ° °MEDICATIONS °See your medication summary on the “After Visit Summary” that the nursing staff will review with you prior to discharge.  You may have some home medications which will be placed on hold until you complete the course of blood thinner medication.  It is important for you to complete the blood thinner medication as prescribed by your surgeon.  Continue your approved medications as instructed at time of discharge. ° °PRECAUTIONS °If you experience chest pain or shortness of breath - call 911 immediately for transfer to the  hospital emergency department.  °If you develop a fever greater that 101 F, purulent drainage from wound, increased redness or drainage from wound, foul odor from the wound/dressing, or calf pain - CONTACT YOUR SURGEON.   °                                                °FOLLOW-UP APPOINTMENTS °Make sure you keep all of your appointments after your operation with your surgeon and caregivers. You should call the office at the above phone number and make an appointment for approximately two weeks after the date of your surgery or on the date instructed by your surgeon outlined in the "After Visit Summary". ° ° °RANGE OF MOTION AND STRENGTHENING EXERCISES  °Rehabilitation of the knee is important following a knee injury or an operation. After just a few days of immobilization, the muscles of the thigh which control the knee become weakened and   shrink (atrophy). Knee exercises are designed to build up the tone and strength of the thigh muscles and to improve knee motion. Often times heat used for twenty to thirty minutes before working out will loosen up your tissues and help with improving the range of motion but do not use heat for the first two weeks following surgery. These exercises can be done on a training (exercise) mat, on the floor, on a table or on a bed. Use what ever works the best and is most comfortable for you Knee exercises include:  °Leg Lifts - While your knee is still immobilized in a splint or cast, you can do straight leg raises. Lift the leg to 60 degrees, hold for 3 sec, and slowly lower the leg. Repeat 10-20 times 2-3 times daily. Perform this exercise against resistance later as your knee gets better.  °Quad and Hamstring Sets - Tighten up the muscle on the front of the thigh (Quad) and hold for 5-10 sec. Repeat this 10-20 times hourly. Hamstring sets are done by pushing the foot backward against an object and holding for 5-10 sec. Repeat as with quad sets.  °· Leg Slides: Lying on your back,  slowly slide your foot toward your buttocks, bending your knee up off the floor (only go as far as is comfortable). Then slowly slide your foot back down until your leg is flat on the floor again. °· Angel Wings: Lying on your back spread your legs to the side as far apart as you can without causing discomfort.  °A rehabilitation program following serious knee injuries can speed recovery and prevent re-injury in the future due to weakened muscles. Contact your doctor or a physical therapist for more information on knee rehabilitation.  ° °IF YOU ARE TRANSFERRED TO A SKILLED REHAB FACILITY °If the patient is transferred to a skilled rehab facility following release from the hospital, a list of the current medications will be sent to the facility for the patient to continue.  When discharged from the skilled rehab facility, please have the facility set up the patient's Home Health Physical Therapy prior to being released. Also, the skilled facility will be responsible for providing the patient with their medications at time of release from the facility to include their pain medication, the muscle relaxants, and their blood thinner medication. If the patient is still at the rehab facility at time of the two week follow up appointment, the skilled rehab facility will also need to assist the patient in arranging follow up appointment in our office and any transportation needs. ° °MAKE SURE YOU:  °Understand these instructions.  °Get help right away if you are not doing well or get worse.  ° ° °Pick up stool softner and laxative for home use following surgery while on pain medications. °Do not submerge incision under water. °Please use good hand washing techniques while changing dressing each day. °May shower starting three days after surgery. °Please use a clean towel to pat the incision dry following showers. °Continue to use ice for pain and swelling after surgery. °Do not use any lotions or creams on the incision  until instructed by your surgeon. ° °Information on my medicine - XARELTO® (Rivaroxaban) ° °Why was Xarelto® prescribed for you? °Xarelto® was prescribed for you to reduce the risk of blood clots forming after orthopedic surgery. The medical term for these abnormal blood clots is venous thromboembolism (VTE). ° °What do you need to know about xarelto® ? °Take your Xarelto® ONCE DAILY   at the same time every day. °You may take it either with or without food. ° °If you have difficulty swallowing the tablet whole, you may crush it and mix in applesauce just prior to taking your dose. ° °Take Xarelto® exactly as prescribed by your doctor and DO NOT stop taking Xarelto® without talking to the doctor who prescribed the medication.  Stopping without other VTE prevention medication to take the place of Xarelto® may increase your risk of developing a clot. ° °After discharge, you should have regular check-up appointments with your healthcare provider that is prescribing your Xarelto®.   ° °What do you do if you miss a dose? °If you miss a dose, take it as soon as you remember on the same day then continue your regularly scheduled once daily regimen the next day. Do not take two doses of Xarelto® on the same day.  ° °Important Safety Information °A possible side effect of Xarelto® is bleeding. You should call your healthcare provider right away if you experience any of the following: °? Bleeding from an injury or your nose that does not stop. °? Unusual colored urine (red or dark brown) or unusual colored stools (red or black). °? Unusual bruising for unknown reasons. °? A serious fall or if you hit your head (even if there is no bleeding). ° °Some medicines may interact with Xarelto® and might increase your risk of bleeding while on Xarelto®. To help avoid this, consult your healthcare provider or pharmacist prior to using any new prescription or non-prescription medications, including herbals, vitamins, non-steroidal  anti-inflammatory drugs (NSAIDs) and supplements. ° °This website has more information on Xarelto®: www.xarelto.com. ° ° ° °

## 2017-12-03 NOTE — Anesthesia Procedure Notes (Signed)
Anesthesia Regional Block: Adductor canal block   Pre-Anesthetic Checklist: ,, timeout performed, Correct Patient, Correct Site, Correct Laterality, Correct Procedure, Correct Position, site marked, Risks and benefits discussed,  Surgical consent,  Pre-op evaluation,  At surgeon's request and post-op pain management  Laterality: Left  Prep: chloraprep       Needles:  Injection technique: Single-shot  Needle Type: Echogenic Needle     Needle Length: 9cm  Needle Gauge: 21     Additional Needles:   Procedures:,,,, ultrasound used (permanent image in chart),,,,  Narrative:  Start time: 12/03/2017 9:00 AM End time: 12/03/2017 9:05 AM Injection made incrementally with aspirations every 5 mL.  Performed by: Personally  Anesthesiologist: Suzette Battiest, MD

## 2017-12-04 ENCOUNTER — Other Ambulatory Visit: Payer: Self-pay | Admitting: Family

## 2017-12-04 LAB — BASIC METABOLIC PANEL
ANION GAP: 8 (ref 5–15)
BUN: 8 mg/dL (ref 8–23)
CALCIUM: 8.7 mg/dL — AB (ref 8.9–10.3)
CO2: 27 mmol/L (ref 22–32)
CREATININE: 0.75 mg/dL (ref 0.44–1.00)
Chloride: 105 mmol/L (ref 98–111)
Glucose, Bld: 121 mg/dL — ABNORMAL HIGH (ref 70–99)
Potassium: 3.9 mmol/L (ref 3.5–5.1)
SODIUM: 140 mmol/L (ref 135–145)

## 2017-12-04 LAB — CBC
HEMATOCRIT: 41.1 % (ref 36.0–46.0)
Hemoglobin: 12.6 g/dL (ref 12.0–15.0)
MCH: 25.3 pg — ABNORMAL LOW (ref 26.0–34.0)
MCHC: 30.7 g/dL (ref 30.0–36.0)
MCV: 82.4 fL (ref 78.0–100.0)
PLATELETS: 270 10*3/uL (ref 150–400)
RBC: 4.99 MIL/uL (ref 3.87–5.11)
RDW: 16.4 % — AB (ref 11.5–15.5)
WBC: 9.9 10*3/uL (ref 4.0–10.5)

## 2017-12-04 NOTE — Progress Notes (Signed)
Physical Therapy Treatment Patient Details Name: Michelle Kennedy MRN: [id number] DOB: May 23, 1955 Today's Date: 12/04/2017    History of Present Illness L TKA, h/o vertigo    PT Comments     patient is progressing well. Will practice 1 step in AM. Plans for Dc tomorrow.   Follow Up Recommendations  Follow surgeon's recommendation for DC plan and follow-up therapies;Outpatient PT     Equipment Recommendations  None recommended by PT    Recommendations for Other Services       Precautions / Restrictions Precautions Precautions: Knee;Fall Precaution Comments: did not wear this visit Required Braces or Orthoses: Knee Immobilizer - Left    Mobility  Bed Mobility   Bed Mobility: Sit to Supine     Supine to sit: Min guard Sit to supine: Supervision   General bed mobility comments: self asissted the  left leg  Transfers Overall transfer level: Needs assistance Equipment used: Rolling walker (2 wheeled) Transfers: Sit to/from Stand Sit to Stand: Min assist         General transfer comment: cues for hand and left leg position  Ambulation/Gait Ambulation/Gait assistance: Min guard Gait Distance (Feet): 110 Feet Assistive device: Rolling walker (2 wheeled) Gait Pattern/deviations: Step-to pattern;Antalgic;Decreased step length - left;Decreased stance time - left Gait velocity: decr   General Gait Details: cues for sequence   Stairs             Wheelchair Mobility    Modified Rankin (Stroke Patients Only)       Balance                                            Cognition Arousal/Alertness: Awake/alert                                            Exercises  quad sets x 10    General Comments        Pertinent Vitals/Pain Pain Score: 4  Pain Location: left knee Pain Descriptors / Indicators: Aching;Discomfort Pain Intervention(s): Monitored during session;Premedicated before session;Limited activity  within patient's tolerance;Ice applied    Home Living                      Prior Function            PT Goals (current goals can now be found in the care plan section) Progress towards PT goals: Progressing toward goals    Frequency    7X/week      PT Plan Current plan remains appropriate    Co-evaluation              AM-PAC PT "6 Clicks" Daily Activity  Outcome Measure  Difficulty turning over in bed (including adjusting bedclothes, sheets and blankets)?: A Little Difficulty moving from lying on back to sitting on the side of the bed? : A Little Difficulty sitting down on and standing up from a chair with arms (e.g., wheelchair, bedside commode, etc,.)?: A Little Help needed moving to and from a bed to chair (including a wheelchair)?: A Little Help needed walking in hospital room?: A Lot Help needed climbing 3-5 steps with a railing? : Total 6 Click Score: 15    End of Session   Activity Tolerance: Patient tolerated  treatment well Patient left: in bed;with call bell/phone within reach Nurse Communication: Mobility status PT Visit Diagnosis: Unsteadiness on feet (R26.81);Pain Pain - Right/Left: Left Pain - part of body: Knee     Time: 1213-1233 PT Time Calculation (min) (ACUTE ONLY): 20 min  Charges:  $Gait Training: 8-22 mins                    G CodesTresa Endo PT [phone]    Claretha Cooper 12/04/2017, 3:59 PM

## 2017-12-04 NOTE — Progress Notes (Signed)
Physical Therapy Treatment Patient Details Name: Michelle Kennedy MRN: [id number] DOB: 12/20/1955 Today's Date: 12/04/2017    History of Present Illness L TKA, h/o vertigo    PT Comments    The patient is  Wanting to get to recliner and get medication. Mobilizing well. Performing self quad sets and ankle pumps. Will amb. next visit  Follow Up Recommendations  Follow surgeon's recommendation for DC plan and follow-up therapies;Outpatient PT     Equipment Recommendations  None recommended by PT    Recommendations for Other Services       Precautions / Restrictions Precautions Precautions: Knee;Fall Precaution Comments: did not wear this visit Required Braces or Orthoses: Knee Immobilizer - Left    Mobility  Bed Mobility   Bed Mobility: Supine to Sit     Supine to sit: Min guard     General bed mobility comments: patient mobilized left leg without assist  Transfers Overall transfer level: Needs assistance Equipment used: Rolling walker (2 wheeled) Transfers: Sit to/from Stand Sit to Stand: Min assist         General transfer comment: cues for hand and left leg position  Ambulation/Gait Ambulation/Gait assistance: Min guard Gait Distance (Feet): 5 Feet Assistive device: Rolling walker (2 wheeled) Gait Pattern/deviations: Step-to pattern;Antalgic;Decreased step length - left;Decreased stance time - left     General Gait Details: cues for sequence   Stairs             Wheelchair Mobility    Modified Rankin (Stroke Patients Only)       Balance                                            Cognition Arousal/Alertness: Awake/alert                                            Exercises      General Comments        Pertinent Vitals/Pain Pain Score: 5  Pain Location: left knee Pain Descriptors / Indicators: Aching;Discomfort Pain Intervention(s): Premedicated before session;Monitored during session;Ice  applied    Home Living                      Prior Function            PT Goals (current goals can now be found in the care plan section) Progress towards PT goals: Progressing toward goals    Frequency    7X/week      PT Plan Current plan remains appropriate    Co-evaluation              AM-PAC PT "6 Clicks" Daily Activity  Outcome Measure  Difficulty turning over in bed (including adjusting bedclothes, sheets and blankets)?: A Little Difficulty moving from lying on back to sitting on the side of the bed? : A Little Difficulty sitting down on and standing up from a chair with arms (e.g., wheelchair, bedside commode, etc,.)?: A Little Help needed moving to and from a bed to chair (including a wheelchair)?: A Little Help needed walking in hospital room?: A Lot Help needed climbing 3-5 steps with a railing? : Total 6 Click Score: 15    End of Session   Activity Tolerance: Patient tolerated treatment  well Patient left: in chair   PT Visit Diagnosis: Unsteadiness on feet (R26.81);Pain Pain - Right/Left: Left Pain - part of body: Knee     Time: 4114-6431 PT Time Calculation (min) (ACUTE ONLY): 17 min  Charges:  $Gait Training: 8-22 mins                    G CodesTresa Endo PT [phone]    Claretha Cooper 12/04/2017, 3:53 PM

## 2017-12-04 NOTE — Progress Notes (Signed)
Subjective: 1 Day Post-Op Procedure(s) (LRB): LEFT TOTAL KNEE ARTHROPLASTY (Left) Patient reports pain as moderate.   Patient seen in rounds with Dr. Wynelle Link. Patient is well, and has had no acute complaints or problems other than pain in the left knee. No SOB or chest pain. Catheter removed this morning.  Plan is to go Home after hospital stay.  Objective: Vital signs in last 24 hours: Temp:  [97.4 F (36.3 C)-99.2 F (37.3 C)] 98.5 F (36.9 C) (07/16 0540) Pulse Rate:  [60-98] 98 (07/16 0540) Resp:  [12-21] 17 (07/16 0540) BP: (120-164)/(68-92) 149/87 (07/16 0540) SpO2:  [91 %-100 %] 92 % (07/16 0540) Weight:  [119.7 kg (264 lb)] 119.7 kg (264 lb) (07/15 0743)  Intake/Output from previous day:  Intake/Output Summary (Last 24 hours) at 12/04/2017 0705 Last data filed at 12/04/2017 0600 Gross per 24 hour  Intake 3156.67 ml  Output 1845 ml  Net 1311.67 ml     Labs: Recent Labs    12/04/17 0604  HGB 12.6   Recent Labs    12/04/17 0604  WBC 9.9  RBC 4.99  HCT 41.1  PLT 270   Recent Labs    12/04/17 0604  NA 140  K 3.9  CL 105  CO2 27  BUN 8  CREATININE 0.75  GLUCOSE 121*  CALCIUM 8.7*    EXAM General - Patient is Alert and Oriented Extremity - Neurologically intact Intact pulses distally Dorsiflexion/Plantar flexion intact Compartment soft Dressing - dressing C/D/I Motor Function - intact, moving foot and toes well on exam.  Hemovac pulled without difficulty.  Past Medical History:  Diagnosis Date  . Abnormally small mouth   . Achilles tendon disorder, right   . Anemia   . Anxiety   . Arthritis    hips and knees  . Asthma    daily inhaler, prn inhaler and neb.  . Asthma due to environmental allergies   . Breast cancer ([place]) 01/2014   left  . Cancer ([place])   . Chest pain   . COPD (chronic obstructive pulmonary disease) ([place])   . Dental bridge present    upper front and lower right  . Dental crowns present    x 3  . Depression   .  Esophageal spasm    reports since her chemo and breast surgery  she developed esophageal spasms and reports this is in the past has casue her 02 to desat in the  70s , denies sycnope in relation to this , does report hx of vertigo as well   . Family history of anesthesia complication    twin brother aspirated and died on OR table, per pt.  . Fibromyalgia   . Gallbladder disease   . GERD (gastroesophageal reflux disease)   . H/O blood clots    had blood to  PICC line   . History of gastric ulcer    as a teenager  . History of seizure age 14   as a reaction to Penicillin - no seizures since  . History of stomach ulcers   . History of thyroid cancer    s/p thyroidectomy  . Hyperlipidemia   . Hypothyroidism   . Joint pain   . Left knee injury   . Liver disorder    seen when she had her hysterectomy , reports she was told it was  " small lesion" ; but asymptomatic   . Lymphedema    left arm  . Migraines   . Multiple food allergies   .  Obesity   . OSA (obstructive sleep apnea) 02/16/2016  . Palpitations    reports no longer experiences  . Personal history of chemotherapy 2015  . Personal history of radiation therapy 2015   Left  . Prediabetes   . Rheumatoid arthritis ([place])   . SOB (shortness of breath)   . Swelling of both ankles   . Tinnitus   . UTI (lower urinary tract infection) 02/17/2014  . Vertigo    last episode  was 2 weeks ago   . Vitamin D deficiency   . Wears contact lenses    left eye only   . Wears hearing aid in both ears     Assessment/Plan: 1 Day Post-Op Procedure(s) (LRB): LEFT TOTAL KNEE ARTHROPLASTY (Left) Principal Problem:   OA (osteoarthritis) of knee  Estimated body mass index is 41.35 kg/m as calculated from the following:   Height as of this encounter: 5\' 7"  (1.702 m).   Weight as of this encounter: 119.7 kg (264 lb). Advance diet Up with therapy D/C IV fluids when tolerating POs well  Anticipated LOS equal to or greater than 2 midnights  due to - Age 38 and older with one or more of the following:  - Obesity  - Expected need for hospital services (PT, OT, Nursing) required for safe  discharge  - Anticipated need for postoperative skilled nursing care or inpatient rehab  - Active co-morbidities: Diabetes, DVT/VTE and Respiratory Failure/COPD OR   - Unanticipated findings during/Post Surgery: None  - Patient is a high risk of re-admission due to: None   DVT Prophylaxis - Xarelto Weight-Bearing as tolerated  D/C O2 and Pulse OX and try on Room Air  Continue working with PT today for two sessions. Hopeful for DC home tomorrow if she is meeting goals.   Ardeen Jourdain, PA-C Orthopaedic Surgery 12/04/2017, 7:05 AM

## 2017-12-04 NOTE — Progress Notes (Signed)
Pt. brought in own machine/hose/nasal mask for use this evening, humidifier filled with s/w and oxygen adapter placed with 2 lpm added to circuit per current setting, RN aware with pt. along with to notify if help needed.

## 2017-12-04 NOTE — Progress Notes (Signed)
Spoke with patient and spouse at bedside. Confirmed plan for OP PT, already arranged. Has RW and 3n1. [phone] 

## 2017-12-05 ENCOUNTER — Other Ambulatory Visit: Payer: Self-pay | Admitting: Family

## 2017-12-05 LAB — CBC
HCT: 36.6 % (ref 36.0–46.0)
HEMOGLOBIN: 11.5 g/dL — AB (ref 12.0–15.0)
MCH: 25.8 pg — ABNORMAL LOW (ref 26.0–34.0)
MCHC: 31.4 g/dL (ref 30.0–36.0)
MCV: 82.2 fL (ref 78.0–100.0)
Platelets: 266 10*3/uL (ref 150–400)
RBC: 4.45 MIL/uL (ref 3.87–5.11)
RDW: 16.5 % — ABNORMAL HIGH (ref 11.5–15.5)
WBC: 14.3 10*3/uL — ABNORMAL HIGH (ref 4.0–10.5)

## 2017-12-05 LAB — BASIC METABOLIC PANEL
Anion gap: 6 (ref 5–15)
BUN: 13 mg/dL (ref 8–23)
CALCIUM: 9.2 mg/dL (ref 8.9–10.3)
CO2: 30 mmol/L (ref 22–32)
CREATININE: 0.67 mg/dL (ref 0.44–1.00)
Chloride: 106 mmol/L (ref 98–111)
GFR calc Af Amer: >60 mL/min (ref >60)
GFR calc non Af Amer: >60 mL/min (ref >60)
GLUCOSE: 129 mg/dL — AB (ref 70–99)
Potassium: 4.3 mmol/L (ref 3.5–5.1)
SODIUM: 142 mmol/L (ref 135–145)

## 2017-12-05 MED ORDER — METHOCARBAMOL 500 MG PO TABS: 500.0000 mg | ORAL_TABLET | Freq: Four times a day (QID) | ORAL | 0 refills | Status: DC | PRN

## 2017-12-05 MED ORDER — GABAPENTIN 300 MG PO CAPS: 300.0000 mg | ORAL_CAPSULE | Freq: Three times a day (TID) | ORAL | 0 refills | Status: DC

## 2017-12-05 MED ORDER — OXYCODONE HCL 5 MG PO TABS: 5.0000 mg | ORAL_TABLET | Freq: Four times a day (QID) | ORAL | 0 refills | Status: DC | PRN

## 2017-12-05 MED ORDER — RIVAROXABAN 10 MG PO TABS: 10.0000 mg | ORAL_TABLET | Freq: Every day | ORAL | 0 refills | Status: DC

## 2017-12-05 NOTE — Progress Notes (Signed)
Subjective: 2 Days Post-Op Procedure(s) (LRB): LEFT TOTAL KNEE ARTHROPLASTY (Left) Patient reports pain as mild.   Patient seen in rounds with Dr. Wynelle Link. Patient is well, and has had no acute complaints or problems. No issues overnight. Reports that therapy is going well. No SOB or chest pain. Voiding well. Positive flatus.  Plan is to go Home after hospital stay.  Objective: Vital signs in last 24 hours: Temp:  [98.2 F (36.8 C)-98.8 F (37.1 C)] 98.5 F (36.9 C) (07/17 0546) Pulse Rate:  [85-100] 94 (07/17 0546) Resp:  [16] 16 (07/17 0546) BP: (111-133)/(68-82) 111/82 (07/17 0546) SpO2:  [89 %-96 %] 89 % (07/17 0546)  Intake/Output from previous day:  Intake/Output Summary (Last 24 hours) at 12/05/2017 1140 Last data filed at 12/05/2017 0914 Gross per 24 hour  Intake 1390 ml  Output 700 ml  Net 690 ml    Intake/Output this shift: Total I/O In: 240 [P.O.:240] Out: 700 [Urine:700]  Labs: Recent Labs    12/04/17 0604 12/05/17 0557  HGB 12.6 11.5*   Recent Labs    12/04/17 0604 12/05/17 0557  WBC 9.9 14.3*  RBC 4.99 4.45  HCT 41.1 36.6  PLT 270 266   Recent Labs    12/04/17 0604 12/05/17 0557  NA 140 142  K 3.9 4.3  CL 105 106  CO2 27 30  BUN 8 13  CREATININE 0.75 0.67  GLUCOSE 121* 129*  CALCIUM 8.7* 9.2    EXAM General - Patient is Alert and Oriented Extremity - Neurologically intact Intact pulses distally Dorsiflexion/Plantar flexion intact No cellulitis present Compartment soft Dressing/Incision - clean, dry, no drainage Motor Function - intact, moving foot and toes well on exam.   Past Medical History:  Diagnosis Date  . Abnormally small mouth   . Achilles tendon disorder, right   . Anemia   . Anxiety   . Arthritis    hips and knees  . Asthma    daily inhaler, prn inhaler and neb.  . Asthma due to environmental allergies   . Breast cancer ([place]) 01/2014   left  . Cancer ([place])   . Chest pain   . COPD (chronic obstructive  pulmonary disease) ([place])   . Dental bridge present    upper front and lower right  . Dental crowns present    x 3  . Depression   . Esophageal spasm    reports since her chemo and breast surgery  she developed esophageal spasms and reports this is in the past has casue her 02 to desat in the  70s , denies sycnope in relation to this , does report hx of vertigo as well   . Family history of anesthesia complication    twin brother aspirated and died on OR table, per pt.  . Fibromyalgia   . Gallbladder disease   . GERD (gastroesophageal reflux disease)   . H/O blood clots    had blood to  PICC line   . History of gastric ulcer    as a teenager  . History of seizure age 68   as a reaction to Penicillin - no seizures since  . History of stomach ulcers   . History of thyroid cancer    s/p thyroidectomy  . Hyperlipidemia   . Hypothyroidism   . Joint pain   . Left knee injury   . Liver disorder    seen when she had her hysterectomy , reports she was told it was  " small lesion" ;  but asymptomatic   . Lymphedema    left arm  . Migraines   . Multiple food allergies   . Obesity   . OSA (obstructive sleep apnea) 02/16/2016  . Palpitations    reports no longer experiences  . Personal history of chemotherapy 2015  . Personal history of radiation therapy 2015   Left  . Prediabetes   . Rheumatoid arthritis ([place])   . SOB (shortness of breath)   . Swelling of both ankles   . Tinnitus   . UTI (lower urinary tract infection) 02/17/2014  . Vertigo    last episode  was 2 weeks ago   . Vitamin D deficiency   . Wears contact lenses    left eye only   . Wears hearing aid in both ears     Assessment/Plan: 2 Days Post-Op Procedure(s) (LRB): LEFT TOTAL KNEE ARTHROPLASTY (Left) Principal Problem:   OA (osteoarthritis) of knee  Estimated body mass index is 41.35 kg/m as calculated from the following:   Height as of this encounter: 5\' 7"  (1.702 m).   Weight as of this encounter: 119.7 kg  (264 lb). Advance diet Up with therapy  DVT Prophylaxis - Xarelto Weight-Bearing as tolerated   Will have her work with therapy this morning. Plan for DC home today if she is meeting goals.   Ardeen Jourdain, PA-C Orthopaedic Surgery 12/05/2017, 11:40 AM

## 2017-12-05 NOTE — Progress Notes (Signed)
Physical Therapy Treatment Patient Details Name: Michelle Kennedy MRN: [id number] DOB: 05/12/1956 Today's Date: 12/05/2017    History of Present Illness L TKA, h/o vertigo    PT Comments    The patient is progressing well. Feels CPM aids lymphedema. Ready for DC.   Follow Up Recommendations  Follow surgeon's recommendation for DC plan and follow-up therapies;Outpatient PT     Equipment Recommendations  None recommended by PT    Recommendations for Other Services       Precautions / Restrictions Precautions Precautions: Knee;Fall Precaution Comments: did not wear this visit    Mobility  Bed Mobility           Sit to supine: Supervision   General bed mobility comments: self asissted the  left leg  Transfers   Equipment used: Rolling walker (2 wheeled) Transfers: Sit to/from Stand Sit to Stand: Supervision         General transfer comment: cues for hand and left leg position  Ambulation/Gait Ambulation/Gait assistance: Supervision Gait Distance (Feet): 200 Feet Assistive device: Rolling walker (2 wheeled) Gait Pattern/deviations: Step-to pattern;Step-through pattern     General Gait Details: cues for sequence   Stairs Stairs: Yes Stairs assistance: Min assist Stair Management: No rails;Forwards;With walker Number of Stairs: 1 General stair comments: cues for sequence   Wheelchair Mobility    Modified Rankin (Stroke Patients Only)       Balance                                            Cognition Arousal/Alertness: Awake/alert                                            Exercises Total Joint Exercises Ankle Circles/Pumps: AROM;Both;10 reps Quad Sets: AROM;Both;10 reps Short Arc QuadSinclair Ship;Left;10 reps Heel Slides: AAROM;Left;10 reps Hip ABduction/ADduction: AAROM;Left;10 reps Straight Leg Raises: AAROM;Left;10 reps Goniometric ROM: 10-50 left knee    General Comments        Pertinent  Vitals/Pain Pain Score: 4  Pain Location: left knee Pain Descriptors / Indicators: Sore Pain Intervention(s): Premedicated before session;Monitored during session;Ice applied    Home Living                      Prior Function            PT Goals (current goals can now be found in the care plan section) Progress towards PT goals: Progressing toward goals    Frequency    7X/week      PT Plan Current plan remains appropriate    Co-evaluation              AM-PAC PT "6 Clicks" Daily Activity  Outcome Measure  Difficulty turning over in bed (including adjusting bedclothes, sheets and blankets)?: A Little Difficulty moving from lying on back to sitting on the side of the bed? : A Little Difficulty sitting down on and standing up from a chair with arms (e.g., wheelchair, bedside commode, etc,.)?: A Little Help needed moving to and from a bed to chair (including a wheelchair)?: A Little Help needed walking in hospital room?: A Little Help needed climbing 3-5 steps with a railing? : A Lot 6 Click Score: 17    End of  Session   Activity Tolerance: Patient tolerated treatment well Patient left: in bed;in CPM   PT Visit Diagnosis: Unsteadiness on feet (R26.81);Pain Pain - Right/Left: Left Pain - part of body: Knee     Time: 0901-0940 PT Time Calculation (min) (ACUTE ONLY): 39 min  Charges:  $Gait Training: 8-22 mins $Therapeutic Exercise: 8-22 mins $Self Care/Home Management: 02-07-2023                    G Codes:          Claretha Cooper 12/05/2017, 4:10 PM

## 2017-12-05 NOTE — Discharge Summary (Signed)
Physician Discharge Summary   Patient ID: Michelle Kennedy MRN: [id number] DOB/AGE: 1956/03/30 62 y.o.  Admit date: 12/03/2017 Discharge date: 12/05/2017  Primary Diagnosis: Primary osteoarthritis left knee   Admission Diagnoses:  Past Medical History:  Diagnosis Date  . Abnormally small mouth   . Achilles tendon disorder, right   . Anemia   . Anxiety   . Arthritis    hips and knees  . Asthma    daily inhaler, prn inhaler and neb.  . Asthma due to environmental allergies   . Breast cancer ([place]) 01/2014   left  . Cancer ([place])   . Chest pain   . COPD (chronic obstructive pulmonary disease) ([place])   . Dental bridge present    upper front and lower right  . Dental crowns present    x 3  . Depression   . Esophageal spasm    reports since her chemo and breast surgery  she developed esophageal spasms and reports this is in the past has casue her 02 to desat in the  70s , denies sycnope in relation to this , does report hx of vertigo as well   . Family history of anesthesia complication    twin brother aspirated and died on OR table, per pt.  . Fibromyalgia   . Gallbladder disease   . GERD (gastroesophageal reflux disease)   . H/O blood clots    had blood to  PICC line   . History of gastric ulcer    as a teenager  . History of seizure age 70   as a reaction to Penicillin - no seizures since  . History of stomach ulcers   . History of thyroid cancer    s/p thyroidectomy  . Hyperlipidemia   . Hypothyroidism   . Joint pain   . Left knee injury   . Liver disorder    seen when she had her hysterectomy , reports she was told it was  " small lesion" ; but asymptomatic   . Lymphedema    left arm  . Migraines   . Multiple food allergies   . Obesity   . OSA (obstructive sleep apnea) 02/16/2016  . Palpitations    reports no longer experiences  . Personal history of chemotherapy 2015  . Personal history of radiation therapy 2015   Left  . Prediabetes   . Rheumatoid  arthritis ([place])   . SOB (shortness of breath)   . Swelling of both ankles   . Tinnitus   . UTI (lower urinary tract infection) 02/17/2014  . Vertigo    last episode  was 2 weeks ago   . Vitamin D deficiency   . Wears contact lenses    left eye only   . Wears hearing aid in both ears    Discharge Diagnoses:   Principal Problem:   OA (osteoarthritis) of knee  Estimated body mass index is 41.35 kg/m as calculated from the following:   Height as of this encounter: _0  (1.702 m).   Weight as of this encounter: 119.7 kg (264 lb).  Procedure:  Procedure(s) (LRB): LEFT TOTAL KNEE ARTHROPLASTY (Left)   Consults: None  HPI: Michelle Kennedy, 62 y.o. female, has a history of pain and functional disability in the left knee due to arthritis and has failed non-surgical conservative treatments for greater than 12 weeks to includeNSAID's and/or analgesics, corticosteriod injections, viscosupplementation injections, flexibility and strengthening excercises, use of assistive devices, weight reduction as appropriate and activity modification.  Onset of symptoms was gradual, starting >10 years ago with gradually worsening course since that time. The patient noted prior procedures on the knee to include  arthroscopy, menisectomy and patellar realignment on the left knee(s).  Patient currently rates pain in the left knee(s) at 10 out of 10 with activity. Patient has night pain, worsening of pain with activity and weight bearing, pain that interferes with activities of daily living, pain with passive range of motion, crepitus and joint swelling.  Patient has evidence of subchondral sclerosis, periarticular osteophytes and joint space narrowing by imaging studies. There is no active infection.    Laboratory Data: Admission on 12/03/2017, Discharged on 12/05/2017  Component Date Value Ref Range Status  . WBC 12/04/2017 9.9  4.0 - 10.5 K/uL Final  . RBC 12/04/2017 4.99  3.87 - 5.11 MIL/uL Final  .  Hemoglobin 12/04/2017 12.6  12.0 - 15.0 g/dL Final  . HCT 12/04/2017 41.1  36.0 - 46.0 % Final  . MCV 12/04/2017 82.4  78.0 - 100.0 fL Final  . MCH 12/04/2017 25.3* 26.0 - 34.0 pg Final  . MCHC 12/04/2017 30.7  30.0 - 36.0 g/dL Final  . RDW 12/04/2017 16.4* 11.5 - 15.5 % Final  . Platelets 12/04/2017 270  150 - 400 K/uL Final   Performed at [facility], [place] [address] [place], [place] [address]  . Sodium 12/04/2017 140  135 - 145 mmol/L Final  . Potassium 12/04/2017 3.9  3.5 - 5.1 mmol/L Final  . Chloride 12/04/2017 105  98 - 111 mmol/L Final   Please note change in reference range.  . CO2 12/04/2017 27  22 - 32 mmol/L Final  . Glucose, Bld 12/04/2017 121* 70 - 99 mg/dL Final   Please note change in reference range.  . BUN 12/04/2017 8  8 - 23 mg/dL Final   Please note change in reference range.  . Creatinine, Ser 12/04/2017 0.75  0.44 - 1.00 mg/dL Final  . Calcium 12/04/2017 8.7* 8.9 - 10.3 mg/dL Final  . GFR calc non Af Amer 12/04/2017 >60  >60 mL/min Final  . GFR calc Af Amer 12/04/2017 >60  >60 mL/min Final   Comment: (NOTE) The eGFR has been calculated using the CKD EPI equation. This calculation has not been validated in all clinical situations. eGFR's persistently <60 mL/min signify possible Chronic Kidney Disease.   Georgiann Hahn gap 12/04/2017 8  5 - 15 Final   Performed at [facility], [place] [address], [place], [place] [address]  . WBC 12/05/2017 14.3* 4.0 - 10.5 K/uL Final  . RBC 12/05/2017 4.45  3.87 - 5.11 MIL/uL Final  . Hemoglobin 12/05/2017 11.5* 12.0 - 15.0 g/dL Final  . HCT 12/05/2017 36.6  36.0 - 46.0 % Final  . MCV 12/05/2017 82.2  78.0 - 100.0 fL Final  . MCH 12/05/2017 25.8* 26.0 - 34.0 pg Final  . MCHC 12/05/2017 31.4  30.0 - 36.0 g/dL Final  . RDW 12/05/2017 16.5* 11.5 - 15.5 % Final  . Platelets 12/05/2017 266  150 - 400 K/uL Final   Performed at [facility], [place] [address], [place], [place] [address]   . Sodium 12/05/2017 142  135 - 145 mmol/L Final  . Potassium 12/05/2017 4.3  3.5 - 5.1 mmol/L Final  . Chloride 12/05/2017 106  98 - 111 mmol/L Final   Please note change in reference range.  . CO2 12/05/2017 30  22 - 32 mmol/L Final  . Glucose, Bld 12/05/2017 129*  70 - 99 mg/dL Final   Please note change in reference range.  . BUN 12/05/2017 13  8 - 23 mg/dL Final   Please note change in reference range.  . Creatinine, Ser 12/05/2017 0.67  0.44 - 1.00 mg/dL Final  . Calcium 12/05/2017 9.2  8.9 - 10.3 mg/dL Final  . GFR calc non Af Amer 12/05/2017 >60  >60 mL/min Final  . GFR calc Af Amer 12/05/2017 >60  >60 mL/min Final   Comment: (NOTE) The eGFR has been calculated using the CKD EPI equation. This calculation has not been validated in all clinical situations. eGFR's persistently <60 mL/min signify possible Chronic Kidney Disease.   Georgiann Hahn gap 12/05/2017 6  5 - 15 Final   Performed at [facility], [place] [address], [place], [place] [address]  Hospital Outpatient Visit on 11/27/2017  Component Date Value Ref Range Status  . MRSA, PCR 11/27/2017 NEGATIVE  NEGATIVE Final  . Staphylococcus aureus 11/27/2017 NEGATIVE  NEGATIVE Final   Comment: (NOTE) The Xpert SA Assay (FDA approved for NASAL specimens in patients 20 years of age and older), is one component of a comprehensive surveillance program. It is not intended to diagnose infection nor to guide or monitor treatment. Performed at [facility], [place] [address] [place], [place] [address]   . aPTT 11/27/2017 29  24 - 36 seconds Final   Performed at [facility], [place] [address], [place], [place] [address]  . WBC 11/27/2017 6.4  4.0 - 10.5 K/uL Final  . RBC 11/27/2017 5.78* 3.87 - 5.11 MIL/uL Final  . Hemoglobin 11/27/2017 14.9  12.0 - 15.0 g/dL Final  . HCT 11/27/2017 45.9  36.0 - 46.0 % Final  . MCV 11/27/2017 79.4  78.0 - 100.0 fL Final  . MCH 11/27/2017 25.8* 26.0 -  34.0 pg Final  . MCHC 11/27/2017 32.5  30.0 - 36.0 g/dL Final  . RDW 11/27/2017 16.0* 11.5 - 15.5 % Final  . Platelets 11/27/2017 363  150 - 400 K/uL Final   Performed at [facility], [place] [address] [place], [place] [address]  . Sodium 11/27/2017 141  135 - 145 mmol/L Final  . Potassium 11/27/2017 4.3  3.5 - 5.1 mmol/L Final  . Chloride 11/27/2017 105  98 - 111 mmol/L Final   Please note change in reference range.  . CO2 11/27/2017 27  22 - 32 mmol/L Final  . Glucose, Bld 11/27/2017 102* 70 - 99 mg/dL Final   Please note change in reference range.  . BUN 11/27/2017 12  8 - 23 mg/dL Final   Please note change in reference range.  . Creatinine, Ser 11/27/2017 0.85  0.44 - 1.00 mg/dL Final  . Calcium 11/27/2017 9.8  8.9 - 10.3 mg/dL Final  . Total Protein 11/27/2017 7.1  6.5 - 8.1 g/dL Final  . Albumin 11/27/2017 4.2  3.5 - 5.0 g/dL Final  . AST 11/27/2017 28  15 - 41 U/L Final  . ALT 11/27/2017 34  0 - 44 U/L Final   Please note change in reference range.  . Alkaline Phosphatase 11/27/2017 63  38 - 126 U/L Final  . Total Bilirubin 11/27/2017 0.7  0.3 - 1.2 mg/dL Final  . GFR calc non Af Amer 11/27/2017 >60  >60 mL/min Final  . GFR calc Af Amer 11/27/2017 >60  >60 mL/min Final   Comment: (NOTE) The eGFR has been calculated using the CKD EPI equation. This calculation has not been validated  in all clinical situations. eGFR's persistently <60 mL/min signify possible Chronic Kidney Disease.   Georgiann Hahn gap 11/27/2017 9  5 - 15 Final   Performed at [facility], [place] [address] [place], [place] [address]  . Prothrombin Time 11/27/2017 13.2  11.4 - 15.2 seconds Final  . INR 11/27/2017 1.01   Final   Performed at [facility], [place] [address] [place], [place] [address]  . ABO/RH(D) 11/27/2017 O POS   Final  . Antibody Screen 11/27/2017 NEG   Final  . Sample Expiration 11/27/2017 12/06/2017   Final  . Extend sample reason  11/27/2017    Final                   Value:NO TRANSFUSIONS OR PREGNANCY IN THE PAST 3 MONTHS Performed at [facility], [place] [address], [place], [place] [address]   . Color, Urine 11/27/2017 YELLOW  YELLOW Final  . APPearance 11/27/2017 CLEAR  CLEAR Final  . Specific Gravity, Urine 11/27/2017 1.011  1.005 - 1.030 Final  . pH 11/27/2017 6.0  5.0 - 8.0 Final  . Glucose, UA 11/27/2017 NEGATIVE  NEGATIVE mg/dL Final  . Hgb urine dipstick 11/27/2017 NEGATIVE  NEGATIVE Final  . Bilirubin Urine 11/27/2017 NEGATIVE  NEGATIVE Final  . Ketones, ur 11/27/2017 NEGATIVE  NEGATIVE mg/dL Final  . Protein, ur 11/27/2017 NEGATIVE  NEGATIVE mg/dL Final  . Nitrite 11/27/2017 NEGATIVE  NEGATIVE Final  . Leukocytes, UA 11/27/2017 SMALL* NEGATIVE Final  . WBC, UA 11/27/2017 0-5  0 - 5 WBC/hpf Final  . Bacteria, UA 11/27/2017 RARE* NONE SEEN Final  . Squamous Epithelial / LPF 11/27/2017 0-5  0 - 5 Final  . Mucus 11/27/2017 PRESENT   Final  . Hyaline Casts, UA 11/27/2017 PRESENT   Final   Performed at [facility], [place] [address], [place], [place] [address]  . ABO/RH(D) 11/27/2017    Final                   Value:O POS Performed at [facility], [place] [address], [place], [place] [address]       EKG: Orders placed or performed in visit on 09/10/17  . EKG 12-Lead     Hospital Course: Michelle Kennedy is a 62 y.o. who was admitted to [facility]. They were brought to the operating room on 12/03/2017 and underwent Procedure(s): LEFT TOTAL KNEE ARTHROPLASTY.  Patient tolerated the procedure well and was later transferred to the recovery room and then to the orthopaedic floor for postoperative care.  They were given PO and IV analgesics for pain control following their surgery.  They were given 24 hours of postoperative antibiotics of  Anti-infectives (From admission, onward)   Start     Dose/Rate Route Frequency Ordered Stop   12/03/17 2100   vancomycin (VANCOCIN) IVPB 1000 mg/200 mL premix     1,000 mg 200 mL/hr over 60 Minutes Intravenous Every 12 hours 12/03/17 1146 12/03/17 2300   12/03/17 0600  vancomycin (VANCOCIN) 1,500 mg in sodium chloride 0.9 % 500 mL IVPB     1,500 mg 250 mL/hr over 120 Minutes Intravenous On call to O.R. 12/02/17 1159 12/03/17 1000     and started on DVT prophylaxis in the form of Xarelto.   PT and OT were ordered for total joint protocol.  Discharge planning consulted to help with postop disposition and equipment needs.  Patient had a fair night on the evening  of surgery.  They started to get up OOB with therapy on day one. Hemovac drain was pulled without difficulty.  Continued to work with therapy into day two.  Dressing was changed on day two and the incision was clean and dry.  The patient had progressed with therapy and meeting their goals.  Incision was healing well.  Patient was seen in rounds and was ready to go home.   Diet: Cardiac diet and Diabetic diet Activity:WBAT Follow-up:in 2 weeks Disposition - Home Discharged Condition: stable   Discharge Instructions    Call MD / Call 911   Complete by:  As directed    If you experience chest pain or shortness of breath, CALL 911 and be transported to the hospital emergency room.  If you develope a fever above 101 F, pus (white drainage) or increased drainage or redness at the wound, or calf pain, call your surgeon's office.   Constipation Prevention   Complete by:  As directed    Drink plenty of fluids.  Prune juice may be helpful.  You may use a stool softener, such as Colace (over the counter) 100 mg twice a day.  Use MiraLax (over the counter) for constipation as needed.   Diet - low sodium heart healthy   Complete by:  As directed    Discharge instructions   Complete by:  As directed    Dr. Gaynelle Arabian Total Joint Specialist [facility] [address], [place], [place] [address] [phone]  TOTAL KNEE REPLACEMENT  POSTOPERATIVE DIRECTIONS  Knee Rehabilitation, Guidelines Following Surgery  Results after knee surgery are often greatly improved when you follow the exercise, range of motion and muscle strengthening exercises prescribed by your doctor. Safety measures are also important to protect the knee from further injury. Any time any of these exercises cause you to have increased pain or swelling in your knee joint, decrease the amount until you are comfortable again and slowly increase them. If you have problems or questions, call your caregiver or physical therapist for advice.   HOME CARE INSTRUCTIONS  Remove items at home which could result in a fall. This includes throw rugs or furniture in walking pathways.  ICE to the affected knee every three hours for 30 minutes at a time and then as needed for pain and swelling.  Continue to use ice on the knee for pain and swelling from surgery. You may notice swelling that will progress down to the foot and ankle.  This is normal after surgery.  Elevate the leg when you are not up walking on it.   Continue to use the breathing machine which will help keep your temperature down.  It is common for your temperature to cycle up and down following surgery, especially at night when you are not up moving around and exerting yourself.  The breathing machine keeps your lungs expanded and your temperature down. Do not place pillow under knee, focus on keeping the knee straight while resting  DIET You may resume your previous home diet once your are discharged from the hospital.  DRESSING / WOUND CARE / SHOWERING You may change your dressing every day with sterile gauze.  Please use good hand washing techniques before changing the dressing.  Do not use any lotions or creams on the incision until instructed by your surgeon. You may start showering once you are discharged home but do not submerge the incision under water. Just pat the incision dry and apply a dry gauze  dressing on daily. Change the surgical dressing daily and reapply a dry dressing each time.  ACTIVITY Walk with your walker as instructed. Use walker as long as suggested by your caregivers. Avoid periods of inactivity such as sitting longer than an hour when not asleep. This helps prevent blood clots.  You may resume a sexual relationship in one month or when given the OK by your doctor.  You may return to work once you are cleared by your doctor.  Do not drive a car for 6 weeks or until released by you surgeon.  Do not drive while taking narcotics.  WEIGHT BEARING Weight bearing as tolerated with assist device (walker, cane, etc) as directed, use it as long as suggested by your surgeon or therapist, typically at least 4-6 weeks.  POSTOPERATIVE CONSTIPATION PROTOCOL Constipation - defined medically as fewer than three stools per week and severe constipation as less than one stool per week.  One of the most common issues patients have following surgery is constipation.  Even if you have a regular bowel pattern at home, your normal regimen is likely to be disrupted due to multiple reasons following surgery.  Combination of anesthesia, postoperative narcotics, change in appetite and fluid intake all can affect your bowels.  In order to avoid complications following surgery, here are some recommendations in order to help you during your recovery period.  Colace (docusate) - Pick up an over-the-counter form of Colace or another stool softener and take twice a day as long as you are requiring postoperative pain medications.  Take with a full glass of water daily.  If you experience loose stools or diarrhea, hold the colace until you stool forms back up.  If your symptoms do not get better within 1 week or if they get worse, check with your doctor.  Dulcolax (bisacodyl) - Pick up over-the-counter and take as directed by the product packaging as needed to assist with the movement of your bowels.  Take  with a full glass of water.  Use this product as needed if not relieved by Colace only.   MiraLax (polyethylene glycol) - Pick up over-the-counter to have on hand.  MiraLax is a solution that will increase the amount of water in your bowels to assist with bowel movements.  Take as directed and can mix with a glass of water, juice, soda, coffee, or tea.  Take if you go more than two days without a movement. Do not use MiraLax more than once per day. Call your doctor if you are still constipated or irregular after using this medication for 7 days in a row.  If you continue to have problems with postoperative constipation, please contact the office for further assistance and recommendations.  If you experience "the worst abdominal pain ever" or develop nausea or vomiting, please contact the office immediatly for further recommendations for treatment.  ITCHING  If you experience itching with your medications, try taking only a single pain pill, or even half a pain pill at a time.  You can also use Benadryl over the counter for itching or also to help with sleep.   TED HOSE STOCKINGS Wear the elastic stockings on both legs for three weeks following surgery during the day but you may remove then at night for sleeping.  MEDICATIONS See your medication summary on the "After Visit Summary" that the nursing staff will review with you prior to discharge.  You may have some home medications which will be placed on hold until you  complete the course of blood thinner medication.  It is important for you to complete the blood thinner medication as prescribed by your surgeon.  Continue your approved medications as instructed at time of discharge.   Gabapentin 300 mg Protocol Take a 300 mg capsule three times a day for two weeks, Then a 300 mg capsule twice a day for two weeks, Then a 300 mg capsule once a day for two weeks, then discontinue the Gabapentin.  PRECAUTIONS If you experience chest pain or shortness  of breath - call 911 immediately for transfer to the hospital emergency department.  If you develop a fever greater that 101 F, purulent drainage from wound, increased redness or drainage from wound, foul odor from the wound/dressing, or calf pain - CONTACT YOUR SURGEON.                                                   FOLLOW-UP APPOINTMENTS Make sure you keep all of your appointments after your operation with your surgeon and caregivers. You should call the office at the above phone number and make an appointment for approximately two weeks after the date of your surgery or on the date instructed by your surgeon outlined in the "After Visit Summary".   RANGE OF MOTION AND STRENGTHENING EXERCISES  Rehabilitation of the knee is important following a knee injury or an operation. After just a few days of immobilization, the muscles of the thigh which control the knee become weakened and shrink (atrophy). Knee exercises are designed to build up the tone and strength of the thigh muscles and to improve knee motion. Often times heat used for twenty to thirty minutes before working out will loosen up your tissues and help with improving the range of motion but do not use heat for the first two weeks following surgery. These exercises can be done on a training (exercise) mat, on the floor, on a table or on a bed. Use what ever works the best and is most comfortable for you Knee exercises include:  Leg Lifts - While your knee is still immobilized in a splint or cast, you can do straight leg raises. Lift the leg to 60 degrees, hold for 3 sec, and slowly lower the leg. Repeat 10-20 times 2-3 times daily. Perform this exercise against resistance later as your knee gets better.  Quad and Hamstring Sets - Tighten up the muscle on the front of the thigh (Quad) and hold for 5-10 sec. Repeat this 10-20 times hourly. Hamstring sets are done by pushing the foot backward against an object and holding for 5-10 sec. Repeat as  with quad sets.  Leg Slides: Lying on your back, slowly slide your foot toward your buttocks, bending your knee up off the floor (only go as far as is comfortable). Then slowly slide your foot back down until your leg is flat on the floor again. Angel Wings: Lying on your back spread your legs to the side as far apart as you can without causing discomfort.  A rehabilitation program following serious knee injuries can speed recovery and prevent re-injury in the future due to weakened muscles. Contact your doctor or a physical therapist for more information on knee rehabilitation.   IF YOU ARE TRANSFERRED TO A SKILLED REHAB FACILITY If the patient is transferred to a skilled rehab facility following release  from the hospital, a list of the current medications will be sent to the facility for the patient to continue.  When discharged from the skilled rehab facility, please have the facility set up the patient's [place] prior to being released. Also, the skilled facility will be responsible for providing the patient with their medications at time of release from the facility to include their pain medication, the muscle relaxants, and their blood thinner medication. If the patient is still at the rehab facility at time of the two week follow up appointment, the skilled rehab facility will also need to assist the patient in arranging follow up appointment in our office and any transportation needs.  MAKE SURE YOU:  Understand these instructions.  Get help right away if you are not doing well or get worse.    Pick up stool softner and laxative for home use following surgery while on pain medications. Do not submerge incision under water. Please use good hand washing techniques while changing dressing each day. May shower starting three days after surgery. Please use a clean towel to pat the incision dry following showers. Continue to use ice for pain and swelling after surgery. Do  not use any lotions or creams on the incision until instructed by your surgeon.   Increase activity slowly as tolerated   Complete by:  As directed      Allergies as of 12/05/2017      Reactions   Bee Venom Anaphylaxis   Contrast Media [iodinated Diagnostic Agents] Shortness Of Breath   Iodine Other (See Comments)   CARDIAC ARREST   Latex Anaphylaxis, Rash   Penicillins Shortness Of Breath, Rash, Other (See Comments)   SEIZURE   Shellfish Allergy Shortness Of Breath, Rash   Aspirin Rash, Other (See Comments)   GI UPSET   Erythromycin Swelling, Rash   SWELLING OF JOINTS   Lidocaine Swelling   SWELLING OF MOUTH AND THROAT   Symbicort [budesonide-formoterol Fumarate] Other (See Comments)   BURNING OF TONGUE AND LIPS   Betadine [povidone Iodine]    Rash. Breathing problems.   Codeine Rash   Compazine [prochlorperazine Maleate] Rash   Rash on face,chest, arms, back   Pentazocine Lactate Rash   Sulfonamide Derivatives Rash      Medication List    STOP taking these medications   CALCIUM-MAGNESIUM PO   Fish Oil 1200 MG Caps   meclizine 25 MG tablet Commonly known as:  ANTIVERT   meloxicam 7.5 MG tablet Commonly known as:  MOBIC   multivitamin tablet   traMADol 50 MG tablet Commonly known as:  ULTRAM   VITAMIN D PO   Vitamin D2 400 units Tabs     TAKE these medications   albuterol (5 MG/ML) 0.5% nebulizer solution Commonly known as:  PROVENTIL Take 2.5 mg by nebulization every 6 (six) hours as needed for wheezing or shortness of breath. What changed:  Another medication with the same name was changed. Make sure you understand how and when to take each.   albuterol 108 (90 Base) MCG/ACT inhaler Commonly known as:  PROAIR HFA INHALE 2 PUFFS INTO THE LUNGS EVERY 6 HOURS AS NEEDED What changed:    how much to take  how to take this  when to take this  reasons to take this  additional instructions   amLODipine 5 MG tablet Commonly known as:  NORVASC Take  1 tablet (5 mg total) by mouth daily.   anastrozole 1 MG tablet Commonly known as:  ARIMIDEX TAKE 1 TABLET BY MOUTH  DAILY   atorvastatin 20 MG tablet Commonly known as:  LIPITOR TAKE 1 TABLET BY MOUTH  DAILY   ciprofloxacin 500 MG tablet Commonly known as:  CIPRO Take 1 tablet (500 mg total) by mouth 2 (two) times daily. Notes to patient:  Are you still taking this medication?   diphenhydrAMINE 25 MG tablet Commonly known as:  BENADRYL Take 1 tablet an hour before MRI.  Then take 1 tablet at bedtime as needed, per your usual regimen. What changed:    how much to take  how to take this  when to take this  reasons to take this  additional instructions   DULoxetine 60 MG capsule Commonly known as:  CYMBALTA TAKE 1 CAPSULE BY MOUTH  DAILY   EPINEPHrine 0.3 mg/0.3 mL Soaj injection Commonly known as:  EPI-PEN Inject 0.3 mLs (0.3 mg total) into the muscle once.   Fluticasone-Salmeterol 250-50 MCG/DOSE Aepb Commonly known as:  ADVAIR DISKUS Inhale 1 puff into the lungs 2 (two) times daily.   gabapentin 300 MG capsule Commonly known as:  NEURONTIN Take 1 capsule (300 mg total) by mouth 3 (three) times daily. Gabapentin 300 mg Protocol Take a 300 mg capsule three times a day for two weeks, Then a 300 mg capsule twice a day for two weeks, Then a 300 mg capsule once a day for two weeks, then discontinue the Gabapentin.   levothyroxine 150 MCG tablet Commonly known as:  SYNTHROID, LEVOTHROID TAKE 1 TABLET BY MOUTH  DAILY   loratadine-pseudoephedrine 10-240 MG 24 hr tablet Commonly known as:  CLARITIN-D 24-hour Take 1 tablet by mouth daily as needed for allergies.   methocarbamol 500 MG tablet Commonly known as:  ROBAXIN Take 1 tablet (500 mg total) by mouth every 6 (six) hours as needed for muscle spasms.   MYRBETRIQ 50 MG Tb24 tablet Generic drug:  mirabegron ER TAKE 1 TABLET BY MOUTH  DAILY   nitroGLYCERIN 0.4 MG SL tablet Commonly known as:  NITROSTAT Place  1 tablet (0.4 mg total) under the tongue every 5 (five) minutes as needed for chest pain.   ondansetron 4 MG tablet Commonly known as:  ZOFRAN Take 1 tablet (4 mg total) by mouth every 8 (eight) hours as needed. What changed:  reasons to take this Notes to patient:  As needed for nausea   oxyCODONE 5 MG immediate release tablet Commonly known as:  Oxy IR/ROXICODONE Take 1-2 tablets (5-10 mg total) by mouth every 6 (six) hours as needed for moderate pain (pain score 4-6).   rivaroxaban 10 MG Tabs tablet Commonly known as:  XARELTO Take 1 tablet (10 mg total) by mouth daily with breakfast. Start taking on:  12/06/2017 Notes to patient:  Blood thinner medication   topiramate 50 MG tablet Commonly known as:  TOPAMAX Take 1 tablet (50 mg total) by mouth daily.      Follow-up Information    Gaynelle Arabian, MD. Schedule an appointment as soon as possible for a visit on 12/18/2017.   Specialty:  Orthopedic Surgery Contact information: [address] [place] [address] [phone]           Signed: Ardeen Jourdain, PA-C Orthopaedic Surgery 12/05/2017, 1:26 PM

## 2017-12-05 NOTE — Telephone Encounter (Signed)
Copied from [place] [id number]. Topic: Quick Communication - Rx Refill/Question >> Dec 05, 2017  5:48 PM Oliver Pila B wrote: Medication: albuterol [facility] HFA) 108 (90 Base) MCG/ACT inhaler [[id number]]   Has the patient contacted their pharmacy? Yes.   (Agent: If no, request that the patient contact the pharmacy for the refill.) (Agent: If yes, when and what did the pharmacy advise?)  Preferred Pharmacy (with phone number or street name): [facility]  Agent: Please be advised that RX refills may take up to 3 business days. We ask that you follow-up with your pharmacy.

## 2017-12-06 NOTE — Telephone Encounter (Signed)
Rx refill request: albuterol 108 MCG/ACt inhaler   Ordered: 08/23/16  LOV: 11/26/17  PCP: [place]: verified

## 2017-12-07 DIAGNOSIS — Z96652 Presence of left artificial knee joint: Secondary | ICD-10-CM | POA: Insufficient documentation

## 2017-12-10 ENCOUNTER — Telehealth: Payer: Self-pay | Admitting: *Deleted

## 2017-12-10 NOTE — Telephone Encounter (Signed)
Copied from [place] [id number]. Topic: Inquiry >> Dec 10, 2017 11:31 AM Conception Chancy, NT wrote: Reason for CRM: patient is calling and states she had knee replacement surgery and was supposed to start rehab last Friday but she has not heard from anyone. Please advise.

## 2017-12-10 NOTE — Telephone Encounter (Signed)
Left detailed message for pt to contact surgeon's office to follow up on post op rehab.

## 2017-12-12 ENCOUNTER — Ambulatory Visit: Payer: 59 | Attending: Orthopedic Surgery | Admitting: Physical Therapy

## 2017-12-12 ENCOUNTER — Encounter: Payer: Self-pay | Admitting: Physical Therapy

## 2017-12-12 ENCOUNTER — Other Ambulatory Visit: Payer: Self-pay

## 2017-12-12 ENCOUNTER — Telehealth: Payer: Self-pay | Admitting: *Deleted

## 2017-12-12 VITALS — HR 73

## 2017-12-12 DIAGNOSIS — R262 Difficulty in walking, not elsewhere classified: Secondary | ICD-10-CM | POA: Diagnosis present

## 2017-12-12 DIAGNOSIS — M6281 Muscle weakness (generalized): Secondary | ICD-10-CM | POA: Insufficient documentation

## 2017-12-12 DIAGNOSIS — M25562 Pain in left knee: Secondary | ICD-10-CM | POA: Diagnosis not present

## 2017-12-12 DIAGNOSIS — M25662 Stiffness of left knee, not elsewhere classified: Secondary | ICD-10-CM

## 2017-12-12 MED ORDER — ALBUTEROL SULFATE HFA 108 (90 BASE) MCG/ACT IN AERS: INHALATION_SPRAY | RESPIRATORY_TRACT | 1 refills | Status: DC

## 2017-12-12 NOTE — Telephone Encounter (Signed)
Pt came in to the office requesting refill of Ventolin. States she has not had to use her inhaler for a long time. Advised pt that [facility] [employer] is on current med list and was last form of rescue inhaler that was sent. Advised her I would send refill of Proair and if not covered we can request change to a different brand. Pt states her chest has felt a little tight since having her knee surgery. Advised pt, if inhaler does not help or if breathing worsens that she should call for appt ASAP due to risk factors post knee replacement on 12/03/17. Refill sent. Pt voices understanding.

## 2017-12-12 NOTE — Telephone Encounter (Signed)
Noted  

## 2017-12-12 NOTE — Therapy (Signed)
[facility] [address]  [place], [place], [address] Phone: [phone]   Fax:  [phone]  Physical Therapy Evaluation  Patient Details  Name: Michelle Kennedy MRN: [id number] Date of Birth: 1956/05/11 Referring Provider: Gaynelle Arabian, MD   Encounter Date: 12/12/2017  PT End of Session - 12/12/17 1216    Visit Number  1    Number of Visits  13    Date for PT Re-Evaluation  01/09/18    Authorization Type  [employer]    PT Start Time  1013    PT Stop Time  1100    PT Time Calculation (min)  47 min    Activity Tolerance  Patient tolerated treatment well;Patient limited by pain    Behavior During Therapy  [facility] for tasks assessed/performed       Past Medical History:  Diagnosis Date  . Abnormally small mouth   . Achilles tendon disorder, right   . Anemia   . Anxiety   . Arthritis    hips and knees  . Asthma    daily inhaler, prn inhaler and neb.  . Asthma due to environmental allergies   . Breast cancer ([place]) 01/2014   left  . Cancer ([place])   . Chest pain   . COPD (chronic obstructive pulmonary disease) ([place])   . Dental bridge present    upper front and lower right  . Dental crowns present    x 3  . Depression   . Esophageal spasm    reports since her chemo and breast surgery  she developed esophageal spasms and reports this is in the past has casue her 02 to desat in the  70s , denies sycnope in relation to this , does report hx of vertigo as well   . Family history of anesthesia complication    twin brother aspirated and died on OR table, per pt.  . Fibromyalgia   . Gallbladder disease   . GERD (gastroesophageal reflux disease)   . H/O blood clots    had blood to  PICC line   . History of gastric ulcer    as a teenager  . History of seizure age 50   as a reaction to Penicillin - no seizures since  . History of stomach ulcers   . History of thyroid cancer    s/p thyroidectomy  . Hyperlipidemia   .  Hypothyroidism   . Joint pain   . Left knee injury   . Liver disorder    seen when she had her hysterectomy , reports she was told it was  " small lesion" ; but asymptomatic   . Lymphedema    left arm  . Migraines   . Multiple food allergies   . Obesity   . OSA (obstructive sleep apnea) 02/16/2016  . Palpitations    reports no longer experiences  . Personal history of chemotherapy 2015  . Personal history of radiation therapy 2015   Left  . Prediabetes   . Rheumatoid arthritis ([place])   . SOB (shortness of breath)   . Swelling of both ankles   . Tinnitus   . UTI (lower urinary tract infection) 02/17/2014  . Vertigo    last episode  was 2 weeks ago   . Vitamin D deficiency   . Wears contact lenses    left eye only   . Wears hearing aid in both ears     Past Surgical History:  Procedure  Laterality Date  . ABDOMINAL HYSTERECTOMY  2004   complete  . ACHILLES TENDON REPAIR Right   . APPENDECTOMY  2004  . BREAST EXCISIONAL BIOPSY    . BREAST LUMPECTOMY Left    2015  . BREAST LUMPECTOMY WITH NEEDLE LOCALIZATION AND AXILLARY SENTINEL LYMPH NODE BX Left 02/23/2014   Procedure: BREAST LUMPECTOMY WITH NEEDLE LOCALIZATION AND AXILLARY SENTINEL LYMPH NODE BIOPSY;  Surgeon: Excell Seltzer, MD;  Location: [facility];  Service: General;  Laterality: Left;  . BREAST SURGERY  2011   left breast biposy  . CHOLECYSTECTOMY  1990  . COLONOSCOPY W/ POLYPECTOMY  06/2009  . EYE SURGERY Right 2013   exc. warts from underneath eyelid  . INCONTINENCE SURGERY  2004  . KNEE ARTHROSCOPY Bilateral    x 6 each knee  . LIGAMENT REPAIR Right    thumb/wrist  . ORIF TOE FRACTURE Right    great toe  . PORTACATH PLACEMENT N/A 03/24/2014   Procedure: INSERTION PORT-A-CATH;  Surgeon: Excell Seltzer, MD;  Location: [facility];  Service: General;  Laterality: N/A;; removed   . THYROIDECTOMY  2000  . TONSILLECTOMY AND ADENOIDECTOMY  2000  . TOTAL KNEE ARTHROPLASTY Left  12/03/2017   Procedure: LEFT TOTAL KNEE ARTHROPLASTY;  Surgeon: Gaynelle Arabian, MD;  Location: [facility];  Service: Orthopedics;  Laterality: Left;  . TUMOR EXCISION     from thoracic spine    Vitals:   12/12/17 1016  Pulse: 73  SpO2: 96%     Subjective Assessment - 12/12/17 1016    Subjective  Patient reports she underwent L TKA on 12/03/17. Reports pre-op patient had quite a bit of valgus positioning which was corrected with surgery. Did not have home health but has been doing exercises given to her in hospital. Reports precautions include avoiding incision immersion or driving. Has been using rollator pre and post-surgery. Patient was very active with water aerobics and anticipates getting back into the water once she is cleared by MD. Had her 1st fall this AM- let go of walker and fell backwards; denies injuries and was able to get herself up. Has been icing knee and using vasopneumatic device losely over L knee to avoid compression. Reports she is 2 years out from breast CA- no active cancers at this time, although with L UE and LE lymphedema and B feet neuropathy as a result of treatment. Patient also reports episode of vertigo ~1 month ago.     Patient is accompained by:  Family member husband    Pertinent History  L UE and LE lymphedema- no vasopneumatic or BP on L side, latex allergy, hx of breast, uterine, thyroid CA with chemo and radiation therapies, R achilles tendon disorder, anemia, asthma, chest pain, COPD, esophageal spasm, fibromyalga, gallbladder disease, hx of seizures, HLD, hypothyroidism, liver disorder, migraines, palpitations, prediabetes, RA, SOB, tinnitus, vertigo    Limitations  Standing;Walking;House hold activities    How long can you sit comfortably?  3-4 hours    How long can you stand comfortably?  15-20 minutes    How long can you walk comfortably?  6-7 minutes    Patient Stated Goals  getting total mobility in L knee and not having as much scar tissue    Currently  in Pain?  Yes    Pain Score  4     Pain Location  Calf    Pain Orientation  Left    Pain Descriptors / Indicators  Spasm    Pain Type  Acute pain    Aggravating Factors   standing/walking, bending    Pain Relieving Factors  meds, ice         [facility] PT Assessment - 12/12/17 1031      Assessment   Medical Diagnosis  Presence of L artificial knee joint    Referring Provider  Gaynelle Arabian, MD    Onset Date/Surgical Date  12/03/17    Next MD Visit  -- unknown    Prior Therapy  Yes- for knees, hands, lumbar      Precautions   Precautions  Knee;Other (comment);Fall L UE/LE lymphedema, hx CA, latex allergy    Precaution Comments  no driving, no immersion, no creams/lotions      Restrictions   Weight Bearing Restrictions  Yes    LLE Weight Bearing  Weight bearing as tolerated      Balance Screen   Has the patient fallen in the past 6 months  Yes    How many times?  1 fell this AM- fell backwards; no injuries    Has the patient had a decrease in activity level because of a fear of falling?   No    Is the patient reluctant to leave their home because of a fear of falling?   No      Home Film/video editor residence    Living Arrangements  Spouse/significant other    Available Help at Discharge  Family    Type of [place] to enter    Entrance Stairs-Number of Steps  1    [place]  One level    [place] - 4 wheels;Cane - quad;Cane - single point;Toilet riser;Grab bars - tub/shower;Grab bars - toilet      Prior Function   Level of Independence  Independent with basic ADLs husband does cooking/cleaning      Cognition   Overall Cognitive Status  Within Functional Limits for tasks assessed      Observation/Other Assessments   Focus on Therapeutic Outcomes (FOTO)   Knee: 28 (72% limited, 50% predicted)      Observation/Other Assessments-Edema    Edema  -- moderate-severe lymphedema  in L thigh, calf, foot      Sensation   Light Touch  Impaired by gross assessment reports neuropathy in B feet d/t chemotherapy      Coordination   Gross Motor Movements are Fluid and Coordinated  Yes      Posture/Postural Control   Posture/Postural Control  Postural limitations    Postural Limitations  Rounded Shoulders      ROM / Strength   AROM / PROM / Strength  Strength;AROM;PROM      AROM   AROM Assessment Site  Knee    Right/Left Knee  Right;Left    Right Knee Extension  4    Right Knee Flexion  115    Left Knee Extension  14    Left Knee Flexion  84      PROM   PROM Assessment Site  Knee    Right/Left Knee  Right;Left    Right Knee Extension  4    Right Knee Flexion  119    Left Knee Extension  10    Left Knee Flexion  90      Strength   Strength Assessment Site  Hip;Knee;Ankle    Right/Left Hip  Right;Left    Right  Hip Flexion  4+/5    Right Hip ABduction  4/5    Right Hip ADduction  4/5    Left Hip Flexion  4-/5 pain in knee    Left Hip ABduction  4-/5    Left Hip ADduction  4-/5    Right/Left Knee  Right    Right Knee Flexion  4+/5    Right Knee Extension  4+/5    Right/Left Ankle  Right;Left    Right Ankle Dorsiflexion  4+/5    Right Ankle Plantar Flexion  4+/5    Left Ankle Dorsiflexion  4+/5    Left Ankle Plantar Flexion  4+/5      Flexibility   Soft Tissue Assessment /Muscle Length  yes    Hamstrings  B mildly tight      Palpation   Patella mobility  unable to assess d/t severe swelling    Palpation comment  mildly tender in L lateral calf with bruising- no redness or extreme swelling      Ambulation/Gait   Ambulation/Gait  Yes    Assistive device  4-wheeled walker    Gait Pattern  Step-through pattern;Decreased hip/knee flexion - left;Decreased weight shift to left;Decreased step length - left;Decreased stance time - left;Trunk flexed    Ambulation Surface  Level;Indoor    Gait velocity  decreased                Objective  measurements completed on examination: See above findings.              PT Education - 12/12/17 1216    Education Details  prognosis, POC, HEP, surgical precautions    Person(s) Educated  Patient    Methods  Explanation;Demonstration;Tactile cues;Verbal cues;Handout    Comprehension  Returned demonstration;Verbalized understanding       PT Short Term Goals - 12/12/17 1228      PT SHORT TERM GOAL #1   Title  Patient to be independent with initial HEP.    Time  2    Period  Weeks    Status  New    Target Date  12/26/17        PT Long Term Goals - 12/12/17 1228      PT LONG TERM GOAL #1   Title  Patient to be independent with advanced HEP.    Time  4    Period  Weeks    Status  New    Target Date  01/09/18      PT LONG TERM GOAL #2   Title  Patient to demonstrate L knee AROM/PROM 0-120 degrees.    Time  4    Period  Weeks    Status  New    Target Date  01/09/18      PT LONG TERM GOAL #3   Title  Patient to demonstrate B LE strength >=4+/5.    Time  4    Period  Weeks    Status  New    Target Date  01/09/18      PT LONG TERM GOAL #4   Title  Patient to demonstrate equal step length, weight shift, and upright trunk with ambulation with LRAD.    Time  4    Period  Weeks    Status  New    Target Date  01/09/18      PT LONG TERM GOAL #5   Title  Patient to report tolerance of 1 hour of walking without pain limiting.  Time  4    Period  Weeks    Status  New    Target Date  01/09/18      Additional Long Term Goals   Additional Long Term Goals  Yes      PT LONG TERM GOAL #6   Title  Patient to demonstrate L LE SLR without quad lag.     Time  4    Period  Weeks    Status  New    Target Date  01/09/18             Plan - 12/12/17 1217    Clinical Impression Statement  Patient is a 62y/o F with complex PMH including breast, uterine, thyroid CA with chemo and radiation therapies, prediabetes, RA presenting to OPPT after L TKA on 12/03/17.  Patient ambulating into clinic with rollator- reports using it before surgery as well. Able to recall several of her surgical precautions- reminded patient of remaining precautions. Patient today with moderate-severe swelling in L LE secondary to lymphedema and post-surgery, mild lateral calf bruising, and well-healing incision. Demonstrates gait deviations, decreased ROM, muscle weakness, and pain. Patient with slight wheeze during entirety of session which she reports occurs at baseline. Vitals WNL. Patient educated on gentle ROM and strengthening HEP; received handout and reported understanding. Advised patient to continue using rollator and be mindful of her actions while on pain meds as patient reported a fall this AM after taking pain meds. Also reminded patient not to use vasopneumatic device on L LE to avoid exacerbation of lymphedema. Patient reported understanding. Would benefit from skilled PT services 3x/week for 4 week to address aforementioned impairments.     Clinical Presentation  Stable    Clinical Decision Making  Low    Rehab Potential  Good    Clinical Impairments Affecting Rehab Potential  L UE and LE lymphedema- no vasopneumatic or BP on L side, latex allergy, hx of breast, uterine, thyroid CA with chemo and radiation therapies, R achilles tendon disorder, anemia, asthma, chest pain, COPD, esophageal spasm, fibromyalga, gallbladder disease, hx of seizures, HLD, hypothyroidism, liver disorder, migraines, palpitations, prediabetes, RA, SOB, tinnitus, vertigo    PT Frequency  3x / week    PT Duration  4 weeks    PT Treatment/Interventions  ADLs/Self Care Home Management;Cryotherapy;Moist Heat;DME Instruction;Gait training;Stair training;Functional mobility training;Therapeutic activities;Therapeutic exercise;Manual techniques;Patient/family education;Orthotic Fit/Training;Neuromuscular re-education;Balance training;Scar mobilization;Passive range of motion;Dry needling;Energy  conservation;Splinting;Taping    PT Next Visit Plan  reassess HEP    Consulted and Agree with Plan of Care  Patient       Patient will benefit from skilled therapeutic intervention in order to improve the following deficits and impairments:  Decreased endurance, Decreased scar mobility, Decreased activity tolerance, Decreased strength, Pain, Decreased balance, Decreased mobility, Difficulty walking, Decreased range of motion, Impaired flexibility, Postural dysfunction  Visit Diagnosis: Acute pain of left knee  Stiffness of left knee, not elsewhere classified  Muscle weakness (generalized)  Difficulty in walking, not elsewhere classified     Problem List Patient Active Problem List   Diagnosis Date Noted  . OA (osteoarthritis) of knee 12/03/2017  . Class 3 obesity without serious comorbidity with body mass index (BMI) of 40.0 to 44.9 in adult 01/01/2017  . Essential hypertension 10/18/2016  . Constipation 10/04/2016  . Vitamin D deficiency 10/04/2016  . Prediabetes 10/04/2016  . Morbid obesity ([place]) 09/20/2016  . Other fatigue 09/20/2016  . OSA (obstructive sleep apnea) 02/16/2016  . Right ankle pain 01/20/2016  .  Strain of right Achilles tendon 11/30/2015  . Benign hypertension 10/11/2015  . Malignant neoplasm of thyroid gland ([place]) 10/11/2015  . De Quervain's tenosynovitis, bilateral 09/15/2015  . Right shoulder pain 09/15/2015  . Trigger finger, acquired 06/22/2015  . Left wrist pain 06/22/2015  . Thumb injury 06/14/2015  . Chemotherapy-induced peripheral neuropathy ([place]) 05/10/2015  . Postmenopausal osteoporosis 12/08/2014  . Preventative health care 12/01/2014  . DVT (deep venous thrombosis) ([place]) 08/28/2014  . Lymphedema of arm 08/28/2014  . Mucositis due to chemotherapy 06/02/2014  . UTI symptoms 02/17/2014  . Menopausal syndrome (hot flashes) 12/31/2013  . Breast cancer of upper-outer quadrant of left female breast ([place]) 12/26/2013  . 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 Gingivitis 12/05/2013  . Seasonal allergies 09/24/2013  . Skin lesion 06/03/2013  . Menopause 06/02/2013  . Elevated blood-pressure reading without diagnosis of hypertension 03/05/2013  . Cough 07/24/2012  . Left knee injury 11/02/2011  . Gout 09/19/2011  . Hyperlipidemia 06/02/2011  . Migraine 06/02/2011  . Tinnitus of both ears 02/14/2011  . Left knee pain 09/23/2010  . FIBROMYALGIA 05/10/2010  . Asthma 03/22/2010  . 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 OVERWEIGHT 12/20/2009  . GERD 12/20/2009  . Depression 12/01/2009     Janene Harvey, PT, DPT 12/12/17 12:35 PM   [facility] [address]  [place], [place], [address] Phone: [phone]   Fax:  [phone]  Name: Michelle Kennedy MRN: [id number] Date of Birth: 05-28-1955

## 2017-12-17 ENCOUNTER — Ambulatory Visit (HOSPITAL_BASED_OUTPATIENT_CLINIC_OR_DEPARTMENT_OTHER)
Admission: RE | Admit: 2017-12-17 | Discharge: 2017-12-17 | Disposition: A | Discharge status: Home / Self Care | Payer: 59 | Source: Ambulatory Visit | Attending: Orthopedic Surgery | Admitting: Orthopedic Surgery

## 2017-12-17 ENCOUNTER — Other Ambulatory Visit (HOSPITAL_BASED_OUTPATIENT_CLINIC_OR_DEPARTMENT_OTHER): Payer: Self-pay | Admitting: Orthopedic Surgery

## 2017-12-17 DIAGNOSIS — M79605 Pain in left leg: Secondary | ICD-10-CM

## 2017-12-17 DIAGNOSIS — M7989 Other specified soft tissue disorders: Secondary | ICD-10-CM | POA: Diagnosis not present

## 2017-12-18 ENCOUNTER — Ambulatory Visit: Payer: 59 | Admitting: Physical Therapy

## 2017-12-18 ENCOUNTER — Encounter: Payer: Self-pay | Admitting: Physical Therapy

## 2017-12-18 DIAGNOSIS — R262 Difficulty in walking, not elsewhere classified: Secondary | ICD-10-CM

## 2017-12-18 DIAGNOSIS — M6281 Muscle weakness (generalized): Secondary | ICD-10-CM

## 2017-12-18 DIAGNOSIS — M25562 Pain in left knee: Secondary | ICD-10-CM

## 2017-12-18 DIAGNOSIS — M25662 Stiffness of left knee, not elsewhere classified: Secondary | ICD-10-CM

## 2017-12-18 NOTE — Therapy (Signed)
[facility] [address]  [place], [place], [address] Phone: [phone]   Fax:  [phone]  Physical Therapy Treatment  Patient Details  Name: Michelle Kennedy MRN: [id number] Date of Birth: 06-28-1955 Referring Provider: Gaynelle Arabian, MD   Encounter Date: 12/18/2017  PT End of Session - 12/18/17 1655    Visit Number  2    Number of Visits  13    Date for PT Re-Evaluation  01/09/18    Authorization Type  [employer]    PT Start Time  1621    PT Stop Time  1701 ice pack    PT Time Calculation (min)  40 min    Activity Tolerance  Patient tolerated treatment well;Patient limited by pain    Behavior During Therapy  [facility] for tasks assessed/performed       Past Medical History:  Diagnosis Date  . Abnormally small mouth   . Achilles tendon disorder, right   . Anemia   . Anxiety   . Arthritis    hips and knees  . Asthma    daily inhaler, prn inhaler and neb.  . Asthma due to environmental allergies   . Breast cancer ([place]) 01/2014   left  . Cancer ([place])   . Chest pain   . COPD (chronic obstructive pulmonary disease) ([place])   . Dental bridge present    upper front and lower right  . Dental crowns present    x 3  . Depression   . Esophageal spasm    reports since her chemo and breast surgery  she developed esophageal spasms and reports this is in the past has casue her 02 to desat in the  70s , denies sycnope in relation to this , does report hx of vertigo as well   . Family history of anesthesia complication    twin brother aspirated and died on OR table, per pt.  . Fibromyalgia   . Gallbladder disease   . GERD (gastroesophageal reflux disease)   . H/O blood clots    had blood to  PICC line   . History of gastric ulcer    as a teenager  . History of seizure age 35   as a reaction to Penicillin - no seizures since  . History of stomach ulcers   . History of thyroid cancer    s/p thyroidectomy  .  Hyperlipidemia   . Hypothyroidism   . Joint pain   . Left knee injury   . Liver disorder    seen when she had her hysterectomy , reports she was told it was  " small lesion" ; but asymptomatic   . Lymphedema    left arm  . Migraines   . Multiple food allergies   . Obesity   . OSA (obstructive sleep apnea) 02/16/2016  . Palpitations    reports no longer experiences  . Personal history of chemotherapy 2015  . Personal history of radiation therapy 2015   Left  . Prediabetes   . Rheumatoid arthritis ([place])   . SOB (shortness of breath)   . Swelling of both ankles   . Tinnitus   . UTI (lower urinary tract infection) 02/17/2014  . Vertigo    last episode  was 2 weeks ago   . Vitamin D deficiency   . Wears contact lenses    left eye only   . Wears hearing aid in both ears     Past Surgical History:  Procedure Laterality Date  . ABDOMINAL HYSTERECTOMY  2004   complete  . ACHILLES TENDON REPAIR Right   . APPENDECTOMY  2004  . BREAST EXCISIONAL BIOPSY    . BREAST LUMPECTOMY Left    2015  . BREAST LUMPECTOMY WITH NEEDLE LOCALIZATION AND AXILLARY SENTINEL LYMPH NODE BX Left 02/23/2014   Procedure: BREAST LUMPECTOMY WITH NEEDLE LOCALIZATION AND AXILLARY SENTINEL LYMPH NODE BIOPSY;  Surgeon: Excell Seltzer, MD;  Location: [facility];  Service: General;  Laterality: Left;  . BREAST SURGERY  2011   left breast biposy  . CHOLECYSTECTOMY  1990  . COLONOSCOPY W/ POLYPECTOMY  06/2009  . EYE SURGERY Right 2013   exc. warts from underneath eyelid  . INCONTINENCE SURGERY  2004  . KNEE ARTHROSCOPY Bilateral    x 6 each knee  . LIGAMENT REPAIR Right    thumb/wrist  . ORIF TOE FRACTURE Right    great toe  . PORTACATH PLACEMENT N/A 03/24/2014   Procedure: INSERTION PORT-A-CATH;  Surgeon: Excell Seltzer, MD;  Location: [facility];  Service: General;  Laterality: N/A;; removed   . THYROIDECTOMY  2000  . TONSILLECTOMY AND ADENOIDECTOMY  2000  . TOTAL KNEE  ARTHROPLASTY Left 12/03/2017   Procedure: LEFT TOTAL KNEE ARTHROPLASTY;  Surgeon: Gaynelle Arabian, MD;  Location: [facility];  Service: Orthopedics;  Laterality: Left;  . TUMOR EXCISION     from thoracic spine    There were no vitals filed for this visit.  Subjective Assessment - 12/18/17 1624    Subjective  Reports had MD appointment this afternnoon, got L knee to 120 degrees. Will be able to get back into pool in 2-3 weeks. Had to get doppler d/t redness- was negative. Started using SPC yesterday.     Pertinent History  L UE and LE lymphedema- no vasopneumatic or BP on L side, latex allergy, hx of breast, uterine, thyroid CA with chemo and radiation therapies, R achilles tendon disorder, anemia, asthma, chest pain, COPD, esophageal spasm, fibromyalga, gallbladder disease, hx of seizures, HLD, hypothyroidism, liver disorder, migraines, palpitations, prediabetes, RA, SOB, tinnitus, vertigo    Patient Stated Goals  getting total mobility in L knee and not having as much scar tissue    Currently in Pain?  Yes    Pain Score  4     Pain Location  Knee    Pain Orientation  Left    Pain Descriptors / Indicators  Dull;Aching    Pain Type  Acute pain;Surgical pain                       [facility] Adult PT Treatment/Exercise - 12/18/17 0001      Exercises   Exercises  Knee/Hip      Knee/Hip Exercises: Stretches   Passive Hamstring Stretch  Left;2 reps;30 seconds;Limitations    Passive Hamstring Stretch Limitations  strap supine    Quad Stretch  Left;2 reps;20 seconds;Limitations    Quad Stretch Limitations  prone strap      Knee/Hip Exercises: Aerobic   Stationary Bike  L1 x 58min- partial revolutions      Knee/Hip Exercises: Standing   Heel Raises  Both;1 set;15 reps;Limitations    Heel Raises Limitations  B UEs at counter top cues for equal wt shift    Other Standing Knee Exercises  standing at counter top wt shift to L; 10x5"      Knee/Hip Exercises: Seated   Long Arc Quad   Left;AROM;Strengthening;1 set;10 reps;Limitations  Hamstring Curl  Left;1 set;Right;10 reps;Limitations    Hamstring Limitations  red TB      Knee/Hip Exercises: Supine   Quad Sets  Strengthening;Left;1 set;5 reps;Limitations    Quad Sets Limitations  5x10"     Heel Slides  AAROM;Left;1 set;10 reps;Limitations    Heel Slides Limitations  10x3"; L LE on orange pball with strap; to tolerance    Straight Leg Raises  Strengthening;Left;1 set;10 reps;Limitations    Straight Leg Raises Limitations  VCs to relax and contract quad after each rep      Knee/Hip Exercises: Sidelying   Hip ABduction  Strengthening;Left;1 set;10 reps;Limitations    Hip ABduction Limitations  TCs for proper alignment      Modalities   Modalities  Cryotherapy      Cryotherapy   Number Minutes Cryotherapy  10 Minutes    Cryotherapy Location  Knee    Type of Cryotherapy  Ice pack               PT Short Term Goals - 12/18/17 1712      PT SHORT TERM GOAL #1   Title  Patient to be independent with initial HEP.    Time  2    Period  Weeks    Status  Achieved        PT Long Term Goals - 12/12/17 1228      PT LONG TERM GOAL #1   Title  Patient to be independent with advanced HEP.    Time  4    Period  Weeks    Status  New    Target Date  01/09/18      PT LONG TERM GOAL #2   Title  Patient to demonstrate L knee AROM/PROM 0-120 degrees.    Time  4    Period  Weeks    Status  New    Target Date  01/09/18      PT LONG TERM GOAL #3   Title  Patient to demonstrate B LE strength >=4+/5.    Time  4    Period  Weeks    Status  New    Target Date  01/09/18      PT LONG TERM GOAL #4   Title  Patient to demonstrate equal step length, weight shift, and upright trunk with ambulation with LRAD.    Time  4    Period  Weeks    Status  New    Target Date  01/09/18      PT LONG TERM GOAL #5   Title  Patient to report tolerance of 1 hour of walking without pain limiting.     Time  4    Period   Weeks    Status  New    Target Date  01/09/18      Additional Long Term Goals   Additional Long Term Goals  Yes      PT LONG TERM GOAL #6   Title  Patient to demonstrate L LE SLR without quad lag.     Time  4    Period  Weeks    Status  New    Target Date  01/09/18            Plan - 12/18/17 1656    Clinical Impression Statement  Patient arrived to session with report that she had a Doppler yesterday to rule out DVT d/t swelling- was negative. L knee still edematous secondary to lymphedema, mild redness and warmth. Saw  MD this afternoon who was pleased with ROM. Patient ambulating into clinic with [employer], reports started using it yesterday- patient moderately unsteady. Good tolerance of bike partial revolutions for knee AROM. Good carryover of HEP exercises- limited cueing required. Progressed hip strengthening with sidelying hip abduction- TCs required for proper alignment. Pt reporting onset of knee pain with LAQ and seated HS curls but reported tolerance and able to continue. Worked on standing weight shifts and heel raises at counter top to encourage increased weight shift to L. Received ice pack to L knee at end of session; patient reporting LBP in supine, opted to sit. Normal integumentary response at end of session and no c/o pain. Advised patient to use walker out in the community and try [facility] at home until she is steadier on her feet. Patient reported understanding.     Clinical Impairments Affecting Rehab Potential  L UE and LE lymphedema- no vasopneumatic or BP on L side, latex allergy, hx of breast, uterine, thyroid CA with chemo and radiation therapies, R achilles tendon disorder, anemia, asthma, chest pain, COPD, esophageal spasm, fibromyalga, gallbladder disease, hx of seizures, HLD, hypothyroidism, liver disorder, migraines, palpitations, prediabetes, RA, SOB, tinnitus, vertigo    PT Treatment/Interventions  ADLs/Self Care Home Management;Cryotherapy;Moist Heat;DME  Instruction;Gait training;Stair training;Functional mobility training;Therapeutic activities;Therapeutic exercise;Manual techniques;Patient/family education;Orthotic Fit/Training;Neuromuscular re-education;Balance training;Scar mobilization;Passive range of motion;Dry needling;Energy conservation;Splinting;Taping    PT Next Visit Plan  gait training with cane    Consulted and Agree with Plan of Care  Patient       Patient will benefit from skilled therapeutic intervention in order to improve the following deficits and impairments:  Decreased endurance, Decreased scar mobility, Decreased activity tolerance, Decreased strength, Pain, Decreased balance, Decreased mobility, Difficulty walking, Decreased range of motion, Impaired flexibility, Postural dysfunction  Visit Diagnosis: Acute pain of left knee  Stiffness of left knee, not elsewhere classified  Muscle weakness (generalized)  Difficulty in walking, not elsewhere classified     Problem List Patient Active Problem List   Diagnosis Date Noted  . OA (osteoarthritis) of knee 12/03/2017  . Class 3 obesity without serious comorbidity with body mass index (BMI) of 40.0 to 44.9 in adult 01/01/2017  . Essential hypertension 10/18/2016  . Constipation 10/04/2016  . Vitamin D deficiency 10/04/2016  . Prediabetes 10/04/2016  . Morbid obesity ([place]) 09/20/2016  . Other fatigue 09/20/2016  . OSA (obstructive sleep apnea) 02/16/2016  . Right ankle pain 01/20/2016  . Strain of right Achilles tendon 11/30/2015  . Benign hypertension 10/11/2015  . Malignant neoplasm of thyroid gland ([place]) 10/11/2015  . De Quervain's tenosynovitis, bilateral 09/15/2015  . Right shoulder pain 09/15/2015  . Trigger finger, acquired 06/22/2015  . Left wrist pain 06/22/2015  . Thumb injury 06/14/2015  . Chemotherapy-induced peripheral neuropathy ([place]) 05/10/2015  . Postmenopausal osteoporosis 12/08/2014  . Preventative health care 12/01/2014  . DVT (deep  venous thrombosis) ([place]) 08/28/2014  . Lymphedema of arm 08/28/2014  . Mucositis due to chemotherapy 06/02/2014  . UTI symptoms 02/17/2014  . Menopausal syndrome (hot flashes) 12/31/2013  . Breast cancer of upper-outer quadrant of left female breast ([place]) 12/26/2013  . 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 Gingivitis 12/05/2013  . Seasonal allergies 09/24/2013  . Skin lesion 06/03/2013  . Menopause 06/02/2013  . Elevated blood-pressure reading without diagnosis of hypertension 03/05/2013  . Cough 07/24/2012  . Left knee injury 11/02/2011  . Gout 09/19/2011  . Hyperlipidemia 06/02/2011  . Migraine 06/02/2011  . Tinnitus of  both ears 02/14/2011  . Left knee pain 09/23/2010  . FIBROMYALGIA 05/10/2010  . Asthma 03/22/2010  . 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 OVERWEIGHT 12/20/2009  . GERD 12/20/2009  . Depression 12/01/2009   Janene Harvey, PT, DPT 12/18/17 5:13 PM   [facility] [address]  [place], [place], [address] Phone: [phone]   Fax:  [phone]  Name: Michelle Kennedy MRN: [id number] Date of Birth: 1956/02/09

## 2017-12-19 ENCOUNTER — Ambulatory Visit: Payer: 59

## 2017-12-19 DIAGNOSIS — M25662 Stiffness of left knee, not elsewhere classified: Secondary | ICD-10-CM

## 2017-12-19 DIAGNOSIS — M6281 Muscle weakness (generalized): Secondary | ICD-10-CM

## 2017-12-19 DIAGNOSIS — M25562 Pain in left knee: Secondary | ICD-10-CM | POA: Diagnosis not present

## 2017-12-19 DIAGNOSIS — R262 Difficulty in walking, not elsewhere classified: Secondary | ICD-10-CM

## 2017-12-19 NOTE — Therapy (Signed)
[facility] [address]  [place], [place], [address] Phone: [phone]   Fax:  [phone]  Physical Therapy Treatment  Patient Details  Name: Michelle Kennedy MRN: [id number] Date of Birth: 06-29-1955 Referring Provider: Gaynelle Arabian, MD   Encounter Date: 12/19/2017  PT End of Session - 12/19/17 0856    Visit Number  3    Number of Visits  13    Date for PT Re-Evaluation  01/09/18    Authorization Type  UHC    PT Start Time  0850    PT Stop Time  0940 ended with 10 min ice pack    PT Time Calculation (min)  50 min    Activity Tolerance  Patient tolerated treatment well;Patient limited by pain    Behavior During Therapy  [facility] for tasks assessed/performed       Past Medical History:  Diagnosis Date  . Abnormally small mouth   . Achilles tendon disorder, right   . Anemia   . Anxiety   . Arthritis    hips and knees  . Asthma    daily inhaler, prn inhaler and neb.  . Asthma due to environmental allergies   . Breast cancer ([place]) 01/2014   left  . Cancer ([employer])   . Chest pain   . COPD (chronic obstructive pulmonary disease) ([place])   . Dental bridge present    upper front and lower right  . Dental crowns present    x 3  . Depression   . Esophageal spasm    reports since her chemo and breast surgery  she developed esophageal spasms and reports this is in the past has casue her 02 to desat in the  70s , denies sycnope in relation to this , does report hx of vertigo as well   . Family history of anesthesia complication    twin brother aspirated and died on OR table, per pt.  . Fibromyalgia   . Gallbladder disease   . GERD (gastroesophageal reflux disease)   . H/O blood clots    had blood to  PICC line   . History of gastric ulcer    as a teenager  . History of seizure age 62   as a reaction to Penicillin - no seizures since  . History of stomach ulcers   . History of thyroid cancer    s/p  thyroidectomy  . Hyperlipidemia   . Hypothyroidism   . Joint pain   . Left knee injury   . Liver disorder    seen when she had her hysterectomy , reports she was told it was  " small lesion" ; but asymptomatic   . Lymphedema    left arm  . Migraines   . Multiple food allergies   . Obesity   . OSA (obstructive sleep apnea) 02/16/2016  . Palpitations    reports no longer experiences  . Personal history of chemotherapy 2015  . Personal history of radiation therapy 2015   Left  . Prediabetes   . Rheumatoid arthritis ([place])   . SOB (shortness of breath)   . Swelling of both ankles   . Tinnitus   . UTI (lower urinary tract infection) 02/17/2014  . Vertigo    last episode  was 2 weeks ago   . Vitamin D deficiency   . Wears contact lenses    left eye only   . Wears hearing aid in both ears  Past Surgical History:  Procedure Laterality Date  . ABDOMINAL HYSTERECTOMY  2004   complete  . ACHILLES TENDON REPAIR Right   . APPENDECTOMY  2004  . BREAST EXCISIONAL BIOPSY    . BREAST LUMPECTOMY Left    2015  . BREAST LUMPECTOMY WITH NEEDLE LOCALIZATION AND AXILLARY SENTINEL LYMPH NODE BX Left 02/23/2014   Procedure: BREAST LUMPECTOMY WITH NEEDLE LOCALIZATION AND AXILLARY SENTINEL LYMPH NODE BIOPSY;  Surgeon: Excell Seltzer, MD;  Location: [place];  Service: General;  Laterality: Left;  . BREAST SURGERY  2011   left breast biposy  . CHOLECYSTECTOMY  1990  . COLONOSCOPY W/ POLYPECTOMY  06/2009  . EYE SURGERY Right 2013   exc. warts from underneath eyelid  . INCONTINENCE SURGERY  2004  . KNEE ARTHROSCOPY Bilateral    x 6 each knee  . LIGAMENT REPAIR Right    thumb/wrist  . ORIF TOE FRACTURE Right    great toe  . PORTACATH PLACEMENT N/A 03/24/2014   Procedure: INSERTION PORT-A-CATH;  Surgeon: Excell Seltzer, MD;  Location: [place];  Service: General;  Laterality: N/A;; removed   . THYROIDECTOMY  2000  . TONSILLECTOMY AND ADENOIDECTOMY  2000   . TOTAL KNEE ARTHROPLASTY Left 12/03/2017   Procedure: LEFT TOTAL KNEE ARTHROPLASTY;  Surgeon: Gaynelle Arabian, MD;  [facility];  Service: Orthopedics;  Laterality: Left;  . TUMOR EXCISION     from thoracic spine    There were no vitals filed for this visit.  Subjective Assessment - 12/19/17 0855    Subjective  Pt. reporting increased pain last night which gave her a poor sleep.     Pertinent History  L UE and LE lymphedema- no vasopneumatic or BP on L side, latex allergy, hx of breast, uterine, thyroid CA with chemo and radiation therapies, R achilles tendon disorder, anemia, asthma, chest pain, COPD, esophageal spasm, fibromyalga, gallbladder disease, hx of seizures, HLD, hypothyroidism, liver disorder, migraines, palpitations, prediabetes, RA, SOB, tinnitus, vertigo    Patient Stated Goals  getting total mobility in L knee and not having as much scar tissue    Currently in Pain?  Yes    Pain Score  6  Up to 9/10 at worst last night     Pain Location  Knee    Pain Orientation  Left    Pain Descriptors / Indicators  Dull;Aching    Pain Type  Acute pain;Surgical pain    Pain Onset  1 to 4 weeks ago    Pain Frequency  Intermittent    Aggravating Factors   standing/walking     Multiple Pain Sites  No                       [facility] Adult PT Treatment/Exercise - 12/19/17 0908      Ambulation/Gait   Ambulation/Gait  Yes    Ambulation/Gait Assistance  5: Supervision    Ambulation Distance (Feet)  90 Feet    Assistive device  Straight cane    Gait Pattern  Step-through pattern;Decreased hip/knee flexion - left;Decreased weight shift to left;Decreased step length - left;Decreased stance time - left    Ambulation Surface  Level;Indoor    Pre-Gait Activities  high knee/knee bends holding onto chair     Gait Comments  Pt. able to demo good overall technique with SPC with good overall sequencing however required cueing for B heel strike      Knee/Hip Exercises: Transport planner  Left;2 reps;30 seconds    Gastroc Stretch Limitations  prostretch       Knee/Hip Exercises: Aerobic   Nustep  Lvl 4, 7 min       Knee/Hip Exercises: Seated   Long Arc Quad  Left;AROM;Strengthening;1 set;10 reps;Limitations    Long Arc Quad Limitations  adduction ball squeeze       Knee/Hip Exercises: Supine   Straight Leg Raises  Strengthening;Left;1 set;10 reps;Limitations    Straight Leg Raises Limitations  1#    Other Supine Knee/Hip Exercises  L knee flexion stretch with heels on peanut p-ball with strap assistance 5" x 10 reps       Modalities   Modalities  Cryotherapy      Cryotherapy   Number Minutes Cryotherapy  10 Minutes    Cryotherapy Location  Knee L    Type of Cryotherapy  Ice pack             PT Education - 12/19/17 1218    Education Details  HEP update    Person(s) Educated  Patient    Methods  Explanation;Demonstration;Verbal cues;Handout    Comprehension  Verbalized understanding;Returned demonstration;Verbal cues required;Need further instruction       PT Short Term Goals - 12/18/17 1712      PT SHORT TERM GOAL #1   Title  Patient to be independent with initial HEP.    Time  2    Period  Weeks    Status  Achieved        PT Long Term Goals - 12/19/17 0857      PT LONG TERM GOAL #1   Title  Patient to be independent with advanced HEP.    Time  4    Period  Weeks    Status  On-going      PT LONG TERM GOAL #2   Title  Patient to demonstrate L knee AROM/PROM 0-120 degrees.    Time  4    Period  Weeks    Status  On-going      PT LONG TERM GOAL #3   Title  Patient to demonstrate B LE strength >=4+/5.    Time  4    Period  Weeks    Status  On-going      PT LONG TERM GOAL #4   Title  Patient to demonstrate equal step length, weight shift, and upright trunk with ambulation with LRAD.    Time  4    Period  Weeks    Status  On-going      PT LONG TERM GOAL #5   Title  Patient to report tolerance of 1 hour of walking  without pain limiting.     Time  4    Period  Weeks    Status  On-going      PT LONG TERM GOAL #6   Title  Patient to demonstrate L LE SLR without quad lag.     Time  4    Period  Weeks    Status  On-going            Plan - 12/19/17 6503    Clinical Impression Statement  Pt. reporting increased pain last night following yesterday's visit.  Tolerated progression of SLR, and gait training with SPC well today.  Only required occasional cueing for proper technique with [facility] however did demo mild instability with SPC thus instructed to continue to ambulate with rollator.  Ended session with ice packs to L knee to decrease  post-exercise swelling and pain.      Clinical Impairments Affecting Rehab Potential  L UE and LE lymphedema- no vasopneumatic or BP on L side, latex allergy, hx of breast, uterine, thyroid CA with chemo and radiation therapies, R achilles tendon disorder, anemia, asthma, chest pain, COPD, esophageal spasm, fibromyalga, gallbladder disease, hx of seizures, HLD, hypothyroidism, liver disorder, migraines, palpitations, prediabetes, RA, SOB, tinnitus, vertigo    PT Treatment/Interventions  ADLs/Self Care Home Management;Cryotherapy;Moist Heat;DME Instruction;Gait training;Stair training;Functional mobility training;Therapeutic activities;Therapeutic exercise;Manual techniques;Patient/family education;Orthotic Fit/Training;Neuromuscular re-education;Balance training;Scar mobilization;Passive range of motion;Dry needling;Energy conservation;Splinting;Taping    Consulted and Agree with Plan of Care  Patient       Patient will benefit from skilled therapeutic intervention in order to improve the following deficits and impairments:  Decreased endurance, Decreased scar mobility, Decreased activity tolerance, Decreased strength, Pain, Decreased balance, Decreased mobility, Difficulty walking, Decreased range of motion, Impaired flexibility, Postural dysfunction  Visit Diagnosis: Acute  pain of left knee  Stiffness of left knee, not elsewhere classified  Muscle weakness (generalized)  Difficulty in walking, not elsewhere classified     Problem List Patient Active Problem List   Diagnosis Date Noted  . OA (osteoarthritis) of knee 12/03/2017  . Class 3 obesity without serious comorbidity with body mass index (BMI) of 40.0 to 44.9 in adult 01/01/2017  . Essential hypertension 10/18/2016  . Constipation 10/04/2016  . Vitamin D deficiency 10/04/2016  . Prediabetes 10/04/2016  . Morbid obesity ([place]) 09/20/2016  . Other fatigue 09/20/2016  . OSA (obstructive sleep apnea) 02/16/2016  . Right ankle pain 01/20/2016  . Strain of right Achilles tendon 11/30/2015  . Benign hypertension 10/11/2015  . Malignant neoplasm of thyroid gland ([place]) 10/11/2015  . De Quervain's tenosynovitis, bilateral 09/15/2015  . Right shoulder pain 09/15/2015  . Trigger finger, acquired 06/22/2015  . Left wrist pain 06/22/2015  . Thumb injury 06/14/2015  . Chemotherapy-induced peripheral neuropathy ([place]) 05/10/2015  . Postmenopausal osteoporosis 12/08/2014  . Preventative health care 12/01/2014  . DVT (deep venous thrombosis) ([place]) 08/28/2014  . Lymphedema of arm 08/28/2014  . Mucositis due to chemotherapy 06/02/2014  . UTI symptoms 02/17/2014  . Menopausal syndrome (hot flashes) 12/31/2013  . Breast cancer of upper-outer quadrant of left female breast ([place]) 12/26/2013  . 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 Gingivitis 12/05/2013  . Seasonal allergies 09/24/2013  . Skin lesion 06/03/2013  . Menopause 06/02/2013  . Elevated blood-pressure reading without diagnosis of hypertension 03/05/2013  . Cough 07/24/2012  . Left knee injury 11/02/2011  . Gout 09/19/2011  . Hyperlipidemia 06/02/2011  . Migraine 06/02/2011  . Tinnitus of both ears 02/14/2011  . Left knee pain 09/23/2010  . FIBROMYALGIA 05/10/2010  . Asthma 03/22/2010  . Postsurgical hypothyroidism  12/20/2009  . OVERWEIGHT 12/20/2009  . GERD 12/20/2009  . Depression 12/01/2009    Bess Harvest, PTA 12/19/17 12:20 PM    [facility] [address]  [place], [place], [address] Phone: [phone]   Fax:  [phone]  Name: Imberly Troxler MRN: [id number] Date of Birth: 1955/06/28

## 2017-12-20 ENCOUNTER — Ambulatory Visit: Payer: 59 | Attending: Orthopedic Surgery

## 2017-12-20 DIAGNOSIS — M6281 Muscle weakness (generalized): Secondary | ICD-10-CM | POA: Insufficient documentation

## 2017-12-20 DIAGNOSIS — M25562 Pain in left knee: Secondary | ICD-10-CM | POA: Diagnosis not present

## 2017-12-20 DIAGNOSIS — M25662 Stiffness of left knee, not elsewhere classified: Secondary | ICD-10-CM | POA: Diagnosis not present

## 2017-12-20 DIAGNOSIS — R262 Difficulty in walking, not elsewhere classified: Secondary | ICD-10-CM | POA: Insufficient documentation

## 2017-12-20 NOTE — Therapy (Signed)
[facility] [address]  [place], [place], [address] Phone: [phone]   Fax:  [phone]  Physical Therapy Treatment  Patient Details  Name: Michelle Kennedy MRN: [id number] Date of Birth: 1955-09-22 Referring Provider: Gaynelle Arabian, MD   Encounter Date: 12/20/2017  PT End of Session - 12/20/17 1627    Visit Number  4    Number of Visits  13    Date for PT Re-Evaluation  01/09/18    Authorization Type  UHC    PT Start Time  6720    PT Stop Time  1703 Ended with 10 min ice pack to L knee     PT Time Calculation (min)  48 min    Activity Tolerance  Patient tolerated treatment well;Patient limited by pain    Behavior During Therapy  [facility] for tasks assessed/performed       Past Medical History:  Diagnosis Date  . Abnormally small mouth   . Achilles tendon disorder, right   . Anemia   . Anxiety   . Arthritis    hips and knees  . Asthma    daily inhaler, prn inhaler and neb.  . Asthma due to environmental allergies   . Breast cancer ([place]) 01/2014   left  . Cancer ([place])   . Chest pain   . COPD (chronic obstructive pulmonary disease) ([place])   . Dental bridge present    upper front and lower right  . Dental crowns present    x 3  . Depression   . Esophageal spasm    reports since her chemo and breast surgery  she developed esophageal spasms and reports this is in the past has casue her 02 to desat in the  70s , denies sycnope in relation to this , does report hx of vertigo as well   . Family history of anesthesia complication    twin brother aspirated and died on OR table, per pt.  . Fibromyalgia   . Gallbladder disease   . GERD (gastroesophageal reflux disease)   . H/O blood clots    had blood to  PICC line   . History of gastric ulcer    as a teenager  . History of seizure age 24   as a reaction to Penicillin - no seizures since  . History of stomach ulcers   . History of thyroid cancer    s/p  thyroidectomy  . Hyperlipidemia   . Hypothyroidism   . Joint pain   . Left knee injury   . Liver disorder    seen when she had her hysterectomy , reports she was told it was  " small lesion" ; but asymptomatic   . Lymphedema    left arm  . Migraines   . Multiple food allergies   . Obesity   . OSA (obstructive sleep apnea) 02/16/2016  . Palpitations    reports no longer experiences  . Personal history of chemotherapy 2015  . Personal history of radiation therapy 2015   Left  . Prediabetes   . Rheumatoid arthritis ([place])   . SOB (shortness of breath)   . Swelling of both ankles   . Tinnitus   . UTI (lower urinary tract infection) 02/17/2014  . Vertigo    last episode  was 2 weeks ago   . Vitamin D deficiency   . Wears contact lenses    left eye only   . Wears hearing aid in both  ears     Past Surgical History:  Procedure Laterality Date  . ABDOMINAL HYSTERECTOMY  2004   complete  . ACHILLES TENDON REPAIR Right   . APPENDECTOMY  2004  . BREAST EXCISIONAL BIOPSY    . BREAST LUMPECTOMY Left    2015  . BREAST LUMPECTOMY WITH NEEDLE LOCALIZATION AND AXILLARY SENTINEL LYMPH NODE BX Left 02/23/2014   Procedure: BREAST LUMPECTOMY WITH NEEDLE LOCALIZATION AND AXILLARY SENTINEL LYMPH NODE BIOPSY;  Surgeon: Excell Seltzer, MD;  Location: [facility];  Service: General;  Laterality: Left;  . BREAST SURGERY  2011   left breast biposy  . CHOLECYSTECTOMY  1990  . COLONOSCOPY W/ POLYPECTOMY  06/2009  . EYE SURGERY Right 2013   exc. warts from underneath eyelid  . INCONTINENCE SURGERY  2004  . KNEE ARTHROSCOPY Bilateral    x 6 each knee  . LIGAMENT REPAIR Right    thumb/wrist  . ORIF TOE FRACTURE Right    great toe  . PORTACATH PLACEMENT N/A 03/24/2014   Procedure: INSERTION PORT-A-CATH;  Surgeon: Excell Seltzer, MD;  Location: [place];  Service: General;  Laterality: N/A;; removed   . THYROIDECTOMY  2000  . TONSILLECTOMY AND ADENOIDECTOMY  2000   . TOTAL KNEE ARTHROPLASTY Left 12/03/2017   Procedure: LEFT TOTAL KNEE ARTHROPLASTY;  Surgeon: Gaynelle Arabian, MD;  [facility];  Service: Orthopedics;  Laterality: Left;  . TUMOR EXCISION     from thoracic spine    There were no vitals filed for this visit.  Subjective Assessment - 12/20/17 1619    Subjective  Pt. doing well today.    Pertinent History  L UE and LE lymphedema- no vasopneumatic or BP on L side, latex allergy, hx of breast, uterine, thyroid CA with chemo and radiation therapies, R achilles tendon disorder, anemia, asthma, chest pain, COPD, esophageal spasm, fibromyalga, gallbladder disease, hx of seizures, HLD, hypothyroidism, liver disorder, migraines, palpitations, prediabetes, RA, SOB, tinnitus, vertigo    Patient Stated Goals  getting total mobility in L knee and not having as much scar tissue    Currently in Pain?  Yes    Pain Score  4     Pain Location  Knee    Pain Orientation  Left    Pain Descriptors / Indicators  Dull;Aching    Pain Type  Acute pain;Surgical pain                       [facility] Adult PT Treatment/Exercise - 12/20/17 1629      Ambulation/Gait   Ambulation/Gait  Yes    Ambulation/Gait Assistance  5: Supervision    Ambulation Distance (Feet)  180 Feet    Assistive device  Straight cane    Gait Pattern  Step-through pattern;Decreased hip/knee flexion - left;Decreased weight shift to left;Decreased step length - left;Decreased stance time - left    Ambulation Surface  Level;Indoor    Gait Comments  Pt. requiring less cueing with sequencing and able to demo greater stability with SPC ambulation today; did requiring some cueing for heel strike and TKE on L      Knee/Hip Exercises: Standing   Heel Raises  Both;15 reps    Heel Raises Limitations  at UBE    Terminal Knee Extension  Left;15 reps;Theraband    Theraband Level (Terminal Knee Extension)  Level 3 (Green)    Terminal Knee Extension Limitations  Cues for overall technique      Forward Step Up  Left;10 reps;Hand Hold: 1;Step Height: 4"    Step Down  Left;10 reps;Step Height: 4";Hand Hold: 1      Knee/Hip Exercises: Seated   Long Arc Quad  Left;15 reps    Long Arc Quad Weight  1 lbs.    Long [employer] Limitations  adduction ball squeeze     Other Seated Knee/Hip Exercises  Seated L fitter leg press (1 blue, 1 black) x 15 reps     Hamstring Curl  Left;1 set;Limitations;15 reps;Strengthening    Hamstring Limitations  red TB    Sit to Sand  10 reps;without UE support from airex pad       Cryotherapy   Number Minutes Cryotherapy  10 Minutes    Cryotherapy Location  Knee    Type of Cryotherapy  Ice pack             PT Education - 12/19/17 1218    Education Details  HEP update    Person(s) Educated  Patient    Methods  Explanation;Demonstration;Verbal cues;Handout    Comprehension  Verbalized understanding;Returned demonstration;Verbal cues required;Need further instruction       PT Short Term Goals - 12/18/17 1712      PT SHORT TERM GOAL #1   Title  Patient to be independent with initial HEP.    Time  2    Period  Weeks    Status  Achieved        PT Long Term Goals - 12/19/17 0857      PT LONG TERM GOAL #1   Title  Patient to be independent with advanced HEP.    Time  4    Period  Weeks    Status  On-going      PT LONG TERM GOAL #2   Title  Patient to demonstrate L knee AROM/PROM 0-120 degrees.    Time  4    Period  Weeks    Status  On-going      PT LONG TERM GOAL #3   Title  Patient to demonstrate B LE strength >=4+/5.    Time  4    Period  Weeks    Status  On-going      PT LONG TERM GOAL #4   Title  Patient to demonstrate equal step length, weight shift, and upright trunk with ambulation with LRAD.    Time  4    Period  Weeks    Status  On-going      PT LONG TERM GOAL #5   Title  Patient to report tolerance of 1 hour of walking without pain limiting.     Time  4    Period  Weeks    Status  On-going      PT LONG  TERM GOAL #6   Title  Patient to demonstrate L LE SLR without quad lag.     Time  4    Period  Weeks    Status  On-going            Plan - 12/20/17 1648    Clinical Impression Statement  Michelle Kennedy doing well today noting no significant soreness following last visit.  Continued gait training with SPC today with pt. able to demo greater stability however did need cueing for B heel strike and TKE.  Able to progress to standing TB resisted TKE and seated fitter leg press strengthening today without issue.  Ended session with ice pack to L knee to decrease post exercise soreness and pain.  Clinical Impairments Affecting Rehab Potential  L UE and LE lymphedema- no vasopneumatic or BP on L side, latex allergy, hx of breast, uterine, thyroid CA with chemo and radiation therapies, R achilles tendon disorder, anemia, asthma, chest pain, COPD, esophageal spasm, fibromyalga, gallbladder disease, hx of seizures, HLD, hypothyroidism, liver disorder, migraines, palpitations, prediabetes, RA, SOB, tinnitus, vertigo    PT Treatment/Interventions  ADLs/Self Care Home Management;Cryotherapy;Moist Heat;DME Instruction;Gait training;Stair training;Functional mobility training;Therapeutic activities;Therapeutic exercise;Manual techniques;Patient/family education;Orthotic Fit/Training;Neuromuscular re-education;Balance training;Scar mobilization;Passive range of motion;Dry needling;Energy conservation;Splinting;Taping    PT Next Visit Plan  Continued gait training with cane    Consulted and Agree with Plan of Care  Patient       Patient will benefit from skilled therapeutic intervention in order to improve the following deficits and impairments:  Decreased endurance, Decreased scar mobility, Decreased activity tolerance, Decreased strength, Pain, Decreased balance, Decreased mobility, Difficulty walking, Decreased range of motion, Impaired flexibility, Postural dysfunction  Visit Diagnosis: Acute pain of left  knee  Stiffness of left knee, not elsewhere classified  Muscle weakness (generalized)  Difficulty in walking, not elsewhere classified     Problem List Patient Active Problem List   Diagnosis Date Noted  . OA (osteoarthritis) of knee 12/03/2017  . Class 3 obesity without serious comorbidity with body mass index (BMI) of 40.0 to 44.9 in adult 01/01/2017  . Essential hypertension 10/18/2016  . Constipation 10/04/2016  . Vitamin D deficiency 10/04/2016  . Prediabetes 10/04/2016  . Morbid obesity ([place]) 09/20/2016  . Other fatigue 09/20/2016  . OSA (obstructive sleep apnea) 02/16/2016  . Right ankle pain 01/20/2016  . Strain of right Achilles tendon 11/30/2015  . Benign hypertension 10/11/2015  . Malignant neoplasm of thyroid gland ([place]) 10/11/2015  . De Quervain's tenosynovitis, bilateral 09/15/2015  . Right shoulder pain 09/15/2015  . Trigger finger, acquired 06/22/2015  . Left wrist pain 06/22/2015  . Thumb injury 06/14/2015  . Chemotherapy-induced peripheral neuropathy ([place]) 05/10/2015  . Postmenopausal osteoporosis 12/08/2014  . Preventative health care 12/01/2014  . DVT (deep venous thrombosis) ([place]) 08/28/2014  . Lymphedema of arm 08/28/2014  . Mucositis due to chemotherapy 06/02/2014  . UTI symptoms 02/17/2014  . Menopausal syndrome (hot flashes) 12/31/2013  . Breast cancer of upper-outer quadrant of left female breast ([place]) 12/26/2013  . 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 Gingivitis 12/05/2013  . Seasonal allergies 09/24/2013  . Skin lesion 06/03/2013  . Menopause 06/02/2013  . Elevated blood-pressure reading without diagnosis of hypertension 03/05/2013  . Cough 07/24/2012  . Left knee injury 11/02/2011  . Gout 09/19/2011  . Hyperlipidemia 06/02/2011  . Migraine 06/02/2011  . Tinnitus of both ears 02/14/2011  . Left knee pain 09/23/2010  . FIBROMYALGIA 05/10/2010  . Asthma 03/22/2010  . 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  OVERWEIGHT 12/20/2009  . GERD 12/20/2009  . Depression 12/01/2009    Bess Harvest, PTA 12/20/17 6:08 PM   [facility] [address]  [place], [place], [address] Phone: [phone]   Fax:  [phone]  Name: Michelle Kennedy MRN: [id number] Date of Birth: [birth year]

## 2017-12-24 ENCOUNTER — Encounter: Payer: Self-pay | Admitting: Physical Therapy

## 2017-12-24 ENCOUNTER — Ambulatory Visit: Payer: 59 | Admitting: Physical Therapy

## 2017-12-24 DIAGNOSIS — M25562 Pain in left knee: Secondary | ICD-10-CM

## 2017-12-24 DIAGNOSIS — R262 Difficulty in walking, not elsewhere classified: Secondary | ICD-10-CM

## 2017-12-24 DIAGNOSIS — M25662 Stiffness of left knee, not elsewhere classified: Secondary | ICD-10-CM

## 2017-12-24 DIAGNOSIS — M6281 Muscle weakness (generalized): Secondary | ICD-10-CM

## 2017-12-24 MED FILL — AMLODIPINE BESYLATE 5 MG TA: 5 | 30 days supply | Qty: 30 | Fill #0

## 2017-12-24 NOTE — Therapy (Signed)
[facility] [address]  [place], [place], [address] Phone: [phone]   Fax:  [phone]  Physical Therapy Treatment  Patient Details  Name: Michelle Kennedy MRN: [id number] Date of Birth: August 19, 1955 Referring Provider: Gaynelle Arabian, MD   Encounter Date: 12/24/2017  PT End of Session - 12/24/17 1237    Visit Number  5    Number of Visits  13    Date for PT Re-Evaluation  01/09/18    Authorization Type  [employer]    PT Start Time  0844    PT Stop Time  0929    PT Time Calculation (min)  45 min    Equipment Utilized During Treatment  Gait belt    Activity Tolerance  Patient tolerated treatment well    Behavior During Therapy  [facility] for tasks assessed/performed       Past Medical History:  Diagnosis Date  . Abnormally small mouth   . Achilles tendon disorder, right   . Anemia   . Anxiety   . Arthritis    hips and knees  . Asthma    daily inhaler, prn inhaler and neb.  . Asthma due to environmental allergies   . Breast cancer ([place]) 01/2014   left  . Cancer ([place])   . Chest pain   . COPD (chronic obstructive pulmonary disease) ([place])   . Dental bridge present    upper front and lower right  . Dental crowns present    x 3  . Depression   . Esophageal spasm    reports since her chemo and breast surgery  she developed esophageal spasms and reports this is in the past has casue her 02 to desat in the  70s , denies sycnope in relation to this , does report hx of vertigo as well   . Family history of anesthesia complication    twin brother aspirated and died on OR table, per pt.  . Fibromyalgia   . Gallbladder disease   . GERD (gastroesophageal reflux disease)   . H/O blood clots    had blood to  PICC line   . History of gastric ulcer    as a teenager  . History of seizure age 62   as a reaction to Penicillin - no seizures since  . History of stomach ulcers   . History of thyroid cancer    s/p thyroidectomy   . Hyperlipidemia   . Hypothyroidism   . Joint pain   . Left knee injury   . Liver disorder    seen when she had her hysterectomy , reports she was told it was  " small lesion" ; but asymptomatic   . Lymphedema    left arm  . Migraines   . Multiple food allergies   . Obesity   . OSA (obstructive sleep apnea) 02/16/2016  . Palpitations    reports no longer experiences  . Personal history of chemotherapy 2015  . Personal history of radiation therapy 2015   Left  . Prediabetes   . Rheumatoid arthritis ([place])   . SOB (shortness of breath)   . Swelling of both ankles   . Tinnitus   . UTI (lower urinary tract infection) 02/17/2014  . Vertigo    last episode  was 2 weeks ago   . Vitamin D deficiency   . Wears contact lenses    left eye only   . Wears hearing aid in both ears  Past Surgical History:  Procedure Laterality Date  . ABDOMINAL HYSTERECTOMY  2004   complete  . ACHILLES TENDON REPAIR Right   . APPENDECTOMY  2004  . BREAST EXCISIONAL BIOPSY    . BREAST LUMPECTOMY Left    2015  . BREAST LUMPECTOMY WITH NEEDLE LOCALIZATION AND AXILLARY SENTINEL LYMPH NODE BX Left 02/23/2014   Procedure: BREAST LUMPECTOMY WITH NEEDLE LOCALIZATION AND AXILLARY SENTINEL LYMPH NODE BIOPSY;  Surgeon: Excell Seltzer, MD;  Location: [facility];  Service: General;  Laterality: Left;  . BREAST SURGERY  2011   left breast biposy  . CHOLECYSTECTOMY  1990  . COLONOSCOPY W/ POLYPECTOMY  06/2009  . EYE SURGERY Right 2013   exc. warts from underneath eyelid  . INCONTINENCE SURGERY  2004  . KNEE ARTHROSCOPY Bilateral    x 6 each knee  . LIGAMENT REPAIR Right    thumb/wrist  . ORIF TOE FRACTURE Right    great toe  . PORTACATH PLACEMENT N/A 03/24/2014   Procedure: INSERTION PORT-A-CATH;  Surgeon: Excell Seltzer, MD;  Location: [facility];  Service: General;  Laterality: N/A;; removed   . THYROIDECTOMY  2000  . TONSILLECTOMY AND ADENOIDECTOMY  2000  . TOTAL  KNEE ARTHROPLASTY Left 12/03/2017   Procedure: LEFT TOTAL KNEE ARTHROPLASTY;  Surgeon: Gaynelle Arabian, MD;  Location: [facility];  Service: Orthopedics;  Laterality: Left;  . TUMOR EXCISION     from thoracic spine    There were no vitals filed for this visit.  Subjective Assessment - 12/24/17 0846    Subjective  Used SPC all weekend and off meds so feels good. L knee swelling has gone down a lot. Has been using ginger oil around knee, not on incision for swelling. R knee has been popping but not buckling.    Pertinent History  L UE and LE lymphedema- no vasopneumatic or BP on L side, latex allergy, hx of breast, uterine, thyroid CA with chemo and radiation therapies, R achilles tendon disorder, anemia, asthma, chest pain, COPD, esophageal spasm, fibromyalga, gallbladder disease, hx of seizures, HLD, hypothyroidism, liver disorder, migraines, palpitations, prediabetes, RA, SOB, tinnitus, vertigo    Patient Stated Goals  getting total mobility in L knee and not having as much scar tissue    Currently in Pain?  Yes    Pain Score  4     Pain Location  Knee    Pain Orientation  Left    Pain Descriptors / Indicators  Aching;Dull    Pain Type  Acute pain;Surgical pain                       [facility] Adult PT Treatment/Exercise - 12/24/17 0001      Ambulation/Gait   Ambulation/Gait  Yes    Ambulation/Gait Assistance  5: Supervision    Ambulation Distance (Feet)  200 Feet    Assistive device  Straight cane    Gait Pattern  Step-through pattern;Decreased hip/knee flexion - left;Decreased weight shift to left;Decreased step length - left;Decreased stance time - left;Trendelenburg    Ambulation Surface  Level;Indoor    Gait Comments  gait training with SPC- marked L hip drop; cues to increase R step length and L wt shift      Knee/Hip Exercises: Stretches   Passive Hamstring Stretch  Left;2 reps;30 seconds;Limitations    Passive Hamstring Stretch Limitations  strap supine    ITB Stretch   Left;2 reps;20 seconds;Limitations    ITB Stretch Limitations  strap supine  Knee/Hip Exercises: Aerobic   Stationary Bike  L1 x 73min- partial revolutions      Knee/Hip Exercises: Standing   Heel Raises  Both;1 set;10 reps;Limitations    Heel Raises Limitations  at counter top    Hip Flexion  AROM;Both;1 set;Knee bent;20 reps at counter top marching    Forward Step Up  Left;Step Height: 4";5 reps;Hand Hold: 0;Limitations    Forward Step Up Limitations  with SPC and CGA    Step Down  Left;Step Height: 4";5 sets;Limitations    Step Down Limitations  with SPC and CGA    Other Standing Knee Exercises  standing at counter top wt shift to L; 10x3"      Knee/Hip Exercises: Seated   Long Arc Quad  AROM;Strengthening;Left;1 set;10 reps;Limitations    Long Arc Quad Limitations  VCs to sit up tall    Hamstring Curl  Left;1 set;Limitations;15 reps;Strengthening    Hamstring Limitations  red TB      Knee/Hip Exercises: Supine   Quad Sets  Strengthening;Left;1 set;Limitations;10 reps    Quad Sets Limitations  10x5"    Heel Slides  AAROM;Left;1 set;10 reps;Limitations    Heel Slides Limitations  10x3"; L LE on orange pball with strap; to tolerance      Knee/Hip Exercises: Sidelying   Hip ABduction  Strengthening;Left;1 set;10 reps;Limitations;Right    Hip ABduction Limitations  TCs for proper alignment      Manual Therapy   Manual Therapy  Joint mobilization;Soft tissue mobilization    Joint Mobilization  L patellar mobilizations grade III sup/inf and M/L to tolerance    Soft tissue mobilization  L lateral HS- TTP and moderate soft tissue restriction               PT Short Term Goals - 12/18/17 1712      PT SHORT TERM GOAL #1   Title  Patient to be independent with initial HEP.    Time  2    Period  Weeks    Status  Achieved        PT Long Term Goals - 12/19/17 0857      PT LONG TERM GOAL #1   Title  Patient to be independent with advanced HEP.    Time  4    Period   Weeks    Status  On-going      PT LONG TERM GOAL #2   Title  Patient to demonstrate L knee AROM/PROM 0-120 degrees.    Time  4    Period  Weeks    Status  On-going      PT LONG TERM GOAL #3   Title  Patient to demonstrate B LE strength >=4+/5.    Time  4    Period  Weeks    Status  On-going      PT LONG TERM GOAL #4   Title  Patient to demonstrate equal step length, weight shift, and upright trunk with ambulation with LRAD.    Time  4    Period  Weeks    Status  On-going      PT LONG TERM GOAL #5   Title  Patient to report tolerance of 1 hour of walking without pain limiting.     Time  4    Period  Weeks    Status  On-going      PT LONG TERM GOAL #6   Title  Patient to demonstrate L LE SLR without quad lag.  Time  4    Period  Weeks    Status  On-going            Plan - 12/24/17 1238    Clinical Impression Statement  Patient arrived to session using SPC. Reports not taking and pain meds this AM- trying to wean off. Patient with noticeable decrease in edema to L knee this date- able to perform L patellar mobs superior/inferior and mediolateral with good tolerance. Also tolerated gentle STM to L lateral HS d/t patient's c/o of tenderness. Good effort with all ther-ex performed today, however patient with slightly elevated pain levels d/t not being on pain medication. Patient also able to perform standing ther-ex at counter top focusing on L sided weight shift. Adjusted SPC to better fit patient and worked on gait training to encourage L weight shift, L knee flexion during swing, and full extension at initial contact. Patient also with L hip drop throughout gait which was addressed with ther-ex. Advised patient to avoid oils near incision site as she reported using ginger oil on L knee for lymphedema. Patient reported understanding. Patient progressing well at this time.     Clinical Impairments Affecting Rehab Potential  L UE and LE lymphedema- no vasopneumatic or BP on L  side, latex allergy, hx of breast, uterine, thyroid CA with chemo and radiation therapies, R achilles tendon disorder, anemia, asthma, chest pain, COPD, esophageal spasm, fibromyalga, gallbladder disease, hx of seizures, HLD, hypothyroidism, liver disorder, migraines, palpitations, prediabetes, RA, SOB, tinnitus, vertigo    PT Treatment/Interventions  ADLs/Self Care Home Management;Cryotherapy;Moist Heat;DME Instruction;Gait training;Stair training;Functional mobility training;Therapeutic activities;Therapeutic exercise;Manual techniques;Patient/family education;Orthotic Fit/Training;Neuromuscular re-education;Balance training;Scar mobilization;Passive range of motion;Dry needling;Energy conservation;Splinting;Taping    Consulted and Agree with Plan of Care  Patient       Patient will benefit from skilled therapeutic intervention in order to improve the following deficits and impairments:  Decreased endurance, Decreased scar mobility, Decreased activity tolerance, Decreased strength, Pain, Decreased balance, Decreased mobility, Difficulty walking, Decreased range of motion, Impaired flexibility, Postural dysfunction  Visit Diagnosis: Acute pain of left knee  Stiffness of left knee, not elsewhere classified  Muscle weakness (generalized)  Difficulty in walking, not elsewhere classified     Problem List Patient Active Problem List   Diagnosis Date Noted  . OA (osteoarthritis) of knee 12/03/2017  . Class 3 obesity without serious comorbidity with body mass index (BMI) of 40.0 to 44.9 in adult 01/01/2017  . Essential hypertension 10/18/2016  . Constipation 10/04/2016  . Vitamin D deficiency 10/04/2016  . Prediabetes 10/04/2016  . Morbid obesity ([place]) 09/20/2016  . Other fatigue 09/20/2016  . OSA (obstructive sleep apnea) 02/16/2016  . Right ankle pain 01/20/2016  . Strain of right Achilles tendon 11/30/2015  . Benign hypertension 10/11/2015  . Malignant neoplasm of thyroid gland ([place])  10/11/2015  . De Quervain's tenosynovitis, bilateral 09/15/2015  . Right shoulder pain 09/15/2015  . Trigger finger, acquired 06/22/2015  . Left wrist pain 06/22/2015  . Thumb injury 06/14/2015  . Chemotherapy-induced peripheral neuropathy ([place]) 05/10/2015  . Postmenopausal osteoporosis 12/08/2014  . Preventative health care 12/01/2014  . DVT (deep venous thrombosis) ([place]) 08/28/2014  . Lymphedema of arm 08/28/2014  . Mucositis due to chemotherapy 06/02/2014  . UTI symptoms 02/17/2014  . Menopausal syndrome (hot flashes) 12/31/2013  . Breast cancer of upper-outer quadrant of left female breast ([place]) 12/26/2013  . 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 Gingivitis 12/05/2013  . Seasonal allergies 09/24/2013  . Skin  lesion 06/03/2013  . Menopause 06/02/2013  . Elevated blood-pressure reading without diagnosis of hypertension 03/05/2013  . Cough 07/24/2012  . Left knee injury 11/02/2011  . Gout 09/19/2011  . Hyperlipidemia 06/02/2011  . Migraine 06/02/2011  . Tinnitus of both ears 02/14/2011  . Left knee pain 09/23/2010  . FIBROMYALGIA 05/10/2010  . Asthma 03/22/2010  . 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 OVERWEIGHT 12/20/2009  . GERD 12/20/2009  . Depression 12/01/2009    Janene Harvey, PT, DPT 12/24/17 12:45 PM   [facility] [address]  [place], [place], [address] Phone: [phone]   Fax:  [phone]  Name: Geralda Baumgardner MRN: [id number] Date of Birth: Apr 17, 1956

## 2017-12-26 ENCOUNTER — Ambulatory Visit: Payer: 59

## 2017-12-26 DIAGNOSIS — M25662 Stiffness of left knee, not elsewhere classified: Secondary | ICD-10-CM

## 2017-12-26 DIAGNOSIS — M25562 Pain in left knee: Secondary | ICD-10-CM

## 2017-12-26 DIAGNOSIS — M6281 Muscle weakness (generalized): Secondary | ICD-10-CM

## 2017-12-26 DIAGNOSIS — R262 Difficulty in walking, not elsewhere classified: Secondary | ICD-10-CM

## 2017-12-26 NOTE — Therapy (Signed)
[facility] [address]  [place], [place], [address] Phone: [phone]   Fax:  [phone]  Physical Therapy Treatment  Patient Details  Name: Michelle Kennedy MRN: [id number] Date of Birth: 10/21/1955 Referring Provider: Gaynelle Arabian, MD   Encounter Date: 12/26/2017  PT End of Session - 12/26/17 1627    Visit Number  6    Number of Visits  13    Date for PT Re-Evaluation  01/09/18    Authorization Type  UHC    PT Start Time  1622    PT Stop Time  1700    PT Time Calculation (min)  38 min    Equipment Utilized During Treatment  Gait belt    Activity Tolerance  Patient tolerated treatment well    Behavior During Therapy  [facility] for tasks assessed/performed       Past Medical History:  Diagnosis Date  . Abnormally small mouth   . Achilles tendon disorder, right   . Anemia   . Anxiety   . Arthritis    hips and knees  . Asthma    daily inhaler, prn inhaler and neb.  . Asthma due to environmental allergies   . Breast cancer ([place]) 01/2014   left  . Cancer ([place])   . Chest pain   . COPD (chronic obstructive pulmonary disease) ([place])   . Dental bridge present    upper front and lower right  . Dental crowns present    x 3  . Depression   . Esophageal spasm    reports since her chemo and breast surgery  she developed esophageal spasms and reports this is in the past has casue her 02 to desat in the  70s , denies sycnope in relation to this , does report hx of vertigo as well   . Family history of anesthesia complication    twin brother aspirated and died on OR table, per pt.  . Fibromyalgia   . Gallbladder disease   . GERD (gastroesophageal reflux disease)   . H/O blood clots    had blood to  PICC line   . History of gastric ulcer    as a teenager  . History of seizure age 30   as a reaction to Penicillin - no seizures since  . History of stomach ulcers   . History of thyroid cancer    s/p thyroidectomy   . Hyperlipidemia   . Hypothyroidism   . Joint pain   . Left knee injury   . Liver disorder    seen when she had her hysterectomy , reports she was told it was  " small lesion" ; but asymptomatic   . Lymphedema    left arm  . Migraines   . Multiple food allergies   . Obesity   . OSA (obstructive sleep apnea) 02/16/2016  . Palpitations    reports no longer experiences  . Personal history of chemotherapy 2015  . Personal history of radiation therapy 2015   Left  . Prediabetes   . Rheumatoid arthritis ([place])   . SOB (shortness of breath)   . Swelling of both ankles   . Tinnitus   . UTI (lower urinary tract infection) 02/17/2014  . Vertigo    last episode  was 2 weeks ago   . Vitamin D deficiency   . Wears contact lenses    left eye only   . Wears hearing aid in both ears  Past Surgical History:  Procedure Laterality Date  . ABDOMINAL HYSTERECTOMY  2004   complete  . ACHILLES TENDON REPAIR Right   . APPENDECTOMY  2004  . BREAST EXCISIONAL BIOPSY    . BREAST LUMPECTOMY Left    2015  . BREAST LUMPECTOMY WITH NEEDLE LOCALIZATION AND AXILLARY SENTINEL LYMPH NODE BX Left 02/23/2014   Procedure: BREAST LUMPECTOMY WITH NEEDLE LOCALIZATION AND AXILLARY SENTINEL LYMPH NODE BIOPSY;  Surgeon: Excell Seltzer, MD;  Location: [place];  Service: General;  Laterality: Left;  . BREAST SURGERY  2011   left breast biposy  . CHOLECYSTECTOMY  1990  . COLONOSCOPY W/ POLYPECTOMY  06/2009  . EYE SURGERY Right 2013   exc. warts from underneath eyelid  . INCONTINENCE SURGERY  2004  . KNEE ARTHROSCOPY Bilateral    x 6 each knee  . LIGAMENT REPAIR Right    thumb/wrist  . ORIF TOE FRACTURE Right    great toe  . PORTACATH PLACEMENT N/A 03/24/2014   Procedure: INSERTION PORT-A-CATH;  Surgeon: Excell Seltzer, MD;  Location: [facility];  Service: General;  Laterality: N/A;; removed   . THYROIDECTOMY  2000  . TONSILLECTOMY AND ADENOIDECTOMY  2000  . TOTAL  KNEE ARTHROPLASTY Left 12/03/2017   Procedure: LEFT TOTAL KNEE ARTHROPLASTY;  Surgeon: Gaynelle Arabian, MD;  [facility];  Service: Orthopedics;  Laterality: Left;  . TUMOR EXCISION     from thoracic spine    There were no vitals filed for this visit.  Subjective Assessment - 12/26/17 1628    Subjective  Pt. noting increased L knee swelling and pain over last two days since last visit.      Patient Stated Goals  getting total mobility in L knee and not having as much scar tissue    Currently in Pain?  Yes    Pain Score  3     Pain Location  Knee    Pain Orientation  Left    Pain Descriptors / Indicators  Aching;Dull    Pain Type  Acute pain;Surgical pain    Pain Frequency  Intermittent    Aggravating Factors   standing, walking     Multiple Pain Sites  Yes    Pain Score  0 up to 3/10 pain at worst     Pain Location  Knee    Pain Orientation  Right;Medial    Pain Descriptors / Indicators  Sharp    Pain Type  Chronic pain    Aggravating Factors   walking                       [facility] Adult PT Treatment/Exercise - 12/26/17 1635      Ambulation/Gait   Ambulation/Gait  Yes    Ambulation/Gait Assistance  5: Supervision    Ambulation Distance (Feet)  180 Feet    Assistive device  Straight cane    Gait Pattern  Step-through pattern;Decreased hip/knee flexion - left;Decreased weight shift to left;Decreased step length - left;Decreased stance time - left;Trendelenburg    Ambulation Surface  Level;Indoor    Gait Comments  Working on even stance time and heel Soil scientist  Other Self-Care Comments    Other Self-Care Comments   Discussed pt. increased L knee swelling, warmth, and pain with pt. and supervising PT with pt. recommended to contact MD regarding this to rule out possible infection       Knee/Hip Exercises: Aerobic  Nustep  Lvl 4, 7 min       Knee/Hip Exercises: Standing   Functional Squat  10 reps;3 seconds    Other Standing Knee  Exercises  R/L wt. shift with LE clearance x 10 reps       Knee/Hip Exercises: Seated   Sit to Sand  10 reps from mat table working on even wt. distribution       Knee/Hip Exercises: Supine   Short Arc Quad Sets  Left;10 reps;Strengthening    Short Arc Quad Sets Limitations  2# - 8" bolster     Straight Leg Raises  Left;15 reps;Strengthening    Straight Leg Raises Limitations  1#      Knee/Hip Exercises: Sidelying   Hip ABduction  Left;Strengthening x 12 resp                PT Short Term Goals - 12/18/17 1712      PT SHORT TERM GOAL #1   Title  Patient to be independent with initial HEP.    Time  2    Period  Weeks    Status  Achieved        PT Long Term Goals - 12/19/17 0857      PT LONG TERM GOAL #1   Title  Patient to be independent with advanced HEP.    Time  4    Period  Weeks    Status  On-going      PT LONG TERM GOAL #2   Title  Patient to demonstrate L knee AROM/PROM 0-120 degrees.    Time  4    Period  Weeks    Status  On-going      PT LONG TERM GOAL #3   Title  Patient to demonstrate B LE strength >=4+/5.    Time  4    Period  Weeks    Status  On-going      PT LONG TERM GOAL #4   Title  Patient to demonstrate equal step length, weight shift, and upright trunk with ambulation with LRAD.    Time  4    Period  Weeks    Status  On-going      PT LONG TERM GOAL #5   Title  Patient to report tolerance of 1 hour of walking without pain limiting.     Time  4    Period  Weeks    Status  On-going      PT LONG TERM GOAL #6   Title  Patient to demonstrate L LE SLR without quad lag.     Time  4    Period  Weeks    Status  On-going            Plan - 12/26/17 1649    Clinical Impression Statement  Pt. noting increased L knee swelling, warmth, and pain since last visit.  Pt. with visual increased redness swelling and supervising PT and therapist noting increased warmth surrounding L knee today.  Discussed need to contact MD to rule out  possible infection today with pt. verbalizing understanding.  [name] tolerated addition of squat and progression of TKE with blue band well today.  Pt. with much improved gait pattern with SPC today however still requiring min cueing for B heel strike and increased step length.      Clinical Impairments Affecting Rehab Potential  L UE and LE lymphedema- no vasopneumatic or BP on L side, latex allergy, hx of breast, uterine, thyroid CA with  chemo and radiation therapies, R achilles tendon disorder, anemia, asthma, chest pain, COPD, esophageal spasm, fibromyalga, gallbladder disease, hx of seizures, HLD, hypothyroidism, liver disorder, migraines, palpitations, prediabetes, RA, SOB, tinnitus, vertigo    PT Treatment/Interventions  ADLs/Self Care Home Management;Cryotherapy;Moist Heat;DME Instruction;Gait training;Stair training;Functional mobility training;Therapeutic activities;Therapeutic exercise;Manual techniques;Patient/family education;Orthotic Fit/Training;Neuromuscular re-education;Balance training;Scar mobilization;Passive range of motion;Dry needling;Energy conservation;Splinting;Taping    Consulted and Agree with Plan of Care  Patient       Patient will benefit from skilled therapeutic intervention in order to improve the following deficits and impairments:  Decreased endurance, Decreased scar mobility, Decreased activity tolerance, Decreased strength, Pain, Decreased balance, Decreased mobility, Difficulty walking, Decreased range of motion, Impaired flexibility, Postural dysfunction  Visit Diagnosis: Acute pain of left knee  Stiffness of left knee, not elsewhere classified  Muscle weakness (generalized)  Difficulty in walking, not elsewhere classified     Problem List Patient Active Problem List   Diagnosis Date Noted  . OA (osteoarthritis) of knee 12/03/2017  . Class 3 obesity without serious comorbidity with body mass index (BMI) of 40.0 to 44.9 in adult 01/01/2017  . Essential  hypertension 10/18/2016  . Constipation 10/04/2016  . Vitamin D deficiency 10/04/2016  . Prediabetes 10/04/2016  . Morbid obesity ([place]) 09/20/2016  . Other fatigue 09/20/2016  . OSA (obstructive sleep apnea) 02/16/2016  . Right ankle pain 01/20/2016  . Strain of right Achilles tendon 11/30/2015  . Benign hypertension 10/11/2015  . Malignant neoplasm of thyroid gland ([place]) 10/11/2015  . De Quervain's tenosynovitis, bilateral 09/15/2015  . Right shoulder pain 09/15/2015  . Trigger finger, acquired 06/22/2015  . Left wrist pain 06/22/2015  . Thumb injury 06/14/2015  . Chemotherapy-induced peripheral neuropathy ([place]) 05/10/2015  . Postmenopausal osteoporosis 12/08/2014  . Preventative health care 12/01/2014  . DVT (deep venous thrombosis) ([place]) 08/28/2014  . Lymphedema of arm 08/28/2014  . Mucositis due to chemotherapy 06/02/2014  . UTI symptoms 02/17/2014  . Menopausal syndrome (hot flashes) 12/31/2013  . Breast cancer of upper-outer quadrant of left female breast ([place]) 12/26/2013  . 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 Gingivitis 12/05/2013  . Seasonal allergies 09/24/2013  . Skin lesion 06/03/2013  . Menopause 06/02/2013  . Elevated blood-pressure reading without diagnosis of hypertension 03/05/2013  . Cough 07/24/2012  . Left knee injury 11/02/2011  . Gout 09/19/2011  . Hyperlipidemia 06/02/2011  . Migraine 06/02/2011  . Tinnitus of both ears 02/14/2011  . Left knee pain 09/23/2010  . FIBROMYALGIA 05/10/2010  . Asthma 03/22/2010  . 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 OVERWEIGHT 12/20/2009  . GERD 12/20/2009  . Depression 12/01/2009    Bess Harvest, PTA 12/26/17 6:00 PM    [facility] [address]  [place], [place], [address] Phone: [phone]   Fax:  [phone]  Name: Dorthea Maina MRN: [id number] Date of Birth: 04/21/1956

## 2017-12-28 ENCOUNTER — Ambulatory Visit: Payer: 59 | Admitting: Physical Therapy

## 2017-12-28 DIAGNOSIS — M25662 Stiffness of left knee, not elsewhere classified: Secondary | ICD-10-CM

## 2017-12-28 DIAGNOSIS — R262 Difficulty in walking, not elsewhere classified: Secondary | ICD-10-CM

## 2017-12-28 DIAGNOSIS — M25562 Pain in left knee: Secondary | ICD-10-CM | POA: Diagnosis not present

## 2017-12-28 DIAGNOSIS — M6281 Muscle weakness (generalized): Secondary | ICD-10-CM

## 2017-12-28 NOTE — Therapy (Signed)
[facility] [address]  [place], [place], [address] Phone: [phone]   Fax:  [phone]  Physical Therapy Treatment  Patient Details  Name: Michelle Kennedy MRN: [id number] Date of Birth: Nov 15, 1955 Referring Provider: Gaynelle Arabian, MD   Encounter Date: 12/28/2017  PT End of Session - 12/28/17 1009    Visit Number  7    Number of Visits  13    Date for PT Re-Evaluation  01/09/18    Authorization Type  UHC    PT Start Time  0849    PT Stop Time  0928    PT Time Calculation (min)  39 min    Activity Tolerance  Patient tolerated treatment well    Behavior During Therapy  [facility] for tasks assessed/performed       Past Medical History:  Diagnosis Date  . Abnormally small mouth   . Achilles tendon disorder, right   . Anemia   . Anxiety   . Arthritis    hips and knees  . Asthma    daily inhaler, prn inhaler and neb.  . Asthma due to environmental allergies   . Breast cancer ([place]) 01/2014   left  . Cancer ([place])   . Chest pain   . COPD (chronic obstructive pulmonary disease) ([place])   . Dental bridge present    upper front and lower right  . Dental crowns present    x 3  . Depression   . Esophageal spasm    reports since her chemo and breast surgery  she developed esophageal spasms and reports this is in the past has casue her 02 to desat in the  70s , denies sycnope in relation to this , does report hx of vertigo as well   . Family history of anesthesia complication    twin brother aspirated and died on OR table, per pt.  . Fibromyalgia   . Gallbladder disease   . GERD (gastroesophageal reflux disease)   . H/O blood clots    had blood to  PICC line   . History of gastric ulcer    as a teenager  . History of seizure age 2   as a reaction to Penicillin - no seizures since  . History of stomach ulcers   . History of thyroid cancer    s/p thyroidectomy  . Hyperlipidemia   . Hypothyroidism   . Joint  pain   . Left knee injury   . Liver disorder    seen when she had her hysterectomy , reports she was told it was  " small lesion" ; but asymptomatic   . Lymphedema    left arm  . Migraines   . Multiple food allergies   . Obesity   . OSA (obstructive sleep apnea) 02/16/2016  . Palpitations    reports no longer experiences  . Personal history of chemotherapy 2015  . Personal history of radiation therapy 2015   Left  . Prediabetes   . Rheumatoid arthritis ([place])   . SOB (shortness of breath)   . Swelling of both ankles   . Tinnitus   . UTI (lower urinary tract infection) 02/17/2014  . Vertigo    last episode  was 2 weeks ago   . Vitamin D deficiency   . Wears contact lenses    left eye only   . Wears hearing aid in both ears     Past Surgical History:  Procedure Laterality Date  .  ABDOMINAL HYSTERECTOMY  2004   complete  . ACHILLES TENDON REPAIR Right   . APPENDECTOMY  2004  . BREAST EXCISIONAL BIOPSY    . BREAST LUMPECTOMY Left    2015  . BREAST LUMPECTOMY WITH NEEDLE LOCALIZATION AND AXILLARY SENTINEL LYMPH NODE BX Left 02/23/2014   Procedure: BREAST LUMPECTOMY WITH NEEDLE LOCALIZATION AND AXILLARY SENTINEL LYMPH NODE BIOPSY;  Surgeon: Michelle Seltzer, MD;  Location: [facility];  Service: General;  Laterality: Left;  . BREAST SURGERY  2011   left breast biposy  . CHOLECYSTECTOMY  1990  . COLONOSCOPY W/ POLYPECTOMY  06/2009  . EYE SURGERY Right 2013   exc. warts from underneath eyelid  . INCONTINENCE SURGERY  2004  . KNEE ARTHROSCOPY Bilateral    x 6 each knee  . LIGAMENT REPAIR Right    thumb/wrist  . ORIF TOE FRACTURE Right    great toe  . PORTACATH PLACEMENT N/A 03/24/2014   Procedure: INSERTION PORT-A-CATH;  Surgeon: Michelle Seltzer, MD;  Location: [facility];  Service: General;  Laterality: N/A;; removed   . THYROIDECTOMY  2000  . TONSILLECTOMY AND ADENOIDECTOMY  2000  . TOTAL KNEE ARTHROPLASTY Left 12/03/2017   Procedure: LEFT  TOTAL KNEE ARTHROPLASTY;  Surgeon: Michelle Arabian, MD;  [facility];  Service: Orthopedics;  Laterality: Left;  . TUMOR EXCISION     from thoracic spine    There were no vitals filed for this visit.  Subjective Assessment - 12/28/17 0851    Subjective  Reports she iced her L knee and today doing better with redness and swelling. R knee is still popping like crazy.    Pertinent History  L UE and LE lymphedema- no vasopneumatic or BP on L side, latex allergy, hx of breast, uterine, thyroid CA with chemo and radiation therapies, R achilles tendon disorder, anemia, asthma, chest pain, COPD, esophageal spasm, fibromyalga, gallbladder disease, hx of seizures, HLD, hypothyroidism, liver disorder, migraines, palpitations, prediabetes, RA, SOB, tinnitus, vertigo    Patient Stated Goals  getting total mobility in L knee and not having as much scar tissue    Currently in Pain?  Yes    Pain Score  3     Pain Location  Knee    Pain Orientation  Left    Pain Descriptors / Indicators  Aching;Dull    Pain Type  Acute pain;Surgical pain    Pain Score  4    Pain Location  Knee    Pain Orientation  Right    Pain Descriptors / Indicators  Michelle Kennedy Adult PT Treatment/Exercise - 12/28/17 0001      Exercises   Exercises  Knee/Hip      Knee/Hip Exercises: Stretches   Passive Hamstring Stretch  Left;2 reps;30 seconds;Limitations    Passive Hamstring Stretch Limitations  strap supine      Knee/Hip Exercises: Aerobic   Nustep  L2 x 6 min      Knee/Hip Exercises: Standing   Knee Flexion  Strengthening;Left;1 set;Right;Limitations;15 reps    Knee Flexion Limitations  at counter top    Terminal Knee Extension  Left;Theraband;10 reps    Theraband Level (Terminal Knee Extension)  Level 4 (Blue)    Terminal Knee Extension Limitations  chair for support; 10x3" hold    Hip ADduction  AROM;Left;1 set;10 reps;Limitations    Hip ADduction Limitations  standing at  counter top    Hip Abduction  Stengthening;Right;Left;1 set;10 reps;Knee straight;Limitations    Abduction Limitations  at counter top   cues for upright trunk   Functional Squat  10 reps;3 seconds;Limitations;2 sets    Functional Squat Limitations  at counter top; cues to bring hips back    Other Standing Knee Exercises  standing hip ABD at counter top; 10x each LE      Knee/Hip Exercises: Seated   Long Arc Quad  AROM;Strengthening;Left;1 set;10 reps;Limitations    Long Arc Quad Weight  2 lbs.    Long [employer] Limitations  VCs to sit up tall      Knee/Hip Exercises: Supine   Straight Leg Raises  Left;Strengthening;10 reps;Limitations    Straight Leg Raises Limitations  2#; cues for quad set before each rep      Manual Therapy   Manual Therapy  Soft tissue mobilization    Soft tissue mobilization  L distal lateral and medial HS- TTP and soft tissue restriction             PT Education - 12/28/17 1009    Education Details  update to [employer]) Educated  Patient    Methods  Explanation;Demonstration;Tactile cues;Verbal cues;Handout    Comprehension  Returned demonstration;Verbalized understanding       PT Short Term Goals - 12/18/17 1712      PT SHORT TERM GOAL #1   Title  Patient to be independent with initial HEP.    Time  2    Period  Weeks    Status  Achieved        PT Long Term Goals - 12/19/17 0857      PT LONG TERM GOAL #1   Title  Patient to be independent with advanced HEP.    Time  4    Period  Weeks    Status  On-going      PT LONG TERM GOAL #2   Title  Patient to demonstrate L knee AROM/PROM 0-120 degrees.    Time  4    Period  Weeks    Status  On-going      PT LONG TERM GOAL #3   Title  Patient to demonstrate B LE strength >=4+/5.    Time  4    Period  Weeks    Status  On-going      PT LONG TERM GOAL #4   Title  Patient to demonstrate equal step length, weight shift, and upright trunk with ambulation with LRAD.    Time  4     Period  Weeks    Status  On-going      PT LONG TERM GOAL #5   Title  Patient to report tolerance of 1 hour of walking without pain limiting.     Time  4    Period  Weeks    Status  On-going      PT LONG TERM GOAL #6   Title  Patient to demonstrate L LE SLR without quad lag.     Time  4    Period  Weeks    Status  On-going            Plan - 12/28/17 1211    Clinical Impression Statement  Patient arrived to session with report of improved swelling in L LE this date. Patient noting tightness in L hamstring at beginning of session. Tolerated STM to L distal lateral and medial HS. Patient TTP and with  soft tissue restriction in this area, noting good improvement after STM and HS stretching. Patient still demonstrating quad lag with SLR- provided cues to perform quad set before each rep. Good tolerance of standing exercises this date- able to perform squats, hamstring curls, and hip ADD/ABD at counter top without c/o significant pain or fatigue. Added standing hip adduction and hamstring curl to HEP as patient requesting additional exercises for HS and inner thigh. Patient reported understanding. No c/o pain at end of session.     Clinical Impairments Affecting Rehab Potential  L UE and LE lymphedema- no vasopneumatic or BP on L side, latex allergy, hx of breast, uterine, thyroid CA with chemo and radiation therapies, R achilles tendon disorder, anemia, asthma, chest pain, COPD, esophageal spasm, fibromyalga, gallbladder disease, hx of seizures, HLD, hypothyroidism, liver disorder, migraines, palpitations, prediabetes, RA, SOB, tinnitus, vertigo    PT Treatment/Interventions  ADLs/Self Care Home Management;Cryotherapy;Moist Heat;DME Instruction;Gait training;Stair training;Functional mobility training;Therapeutic activities;Therapeutic exercise;Manual techniques;Patient/family education;Orthotic Fit/Training;Neuromuscular re-education;Balance training;Scar mobilization;Passive range of motion;Dry  needling;Energy conservation;Splinting;Taping    Consulted and Agree with Plan of Care  Patient       Patient will benefit from skilled therapeutic intervention in order to improve the following deficits and impairments:  Decreased endurance, Decreased scar mobility, Decreased activity tolerance, Decreased strength, Pain, Decreased balance, Decreased mobility, Difficulty walking, Decreased range of motion, Impaired flexibility, Postural dysfunction  Visit Diagnosis: Acute pain of left knee  Stiffness of left knee, not elsewhere classified  Muscle weakness (generalized)  Difficulty in walking, not elsewhere classified     Problem List Patient Active Problem List   Diagnosis Date Noted  . OA (osteoarthritis) of knee 12/03/2017  . Class 3 obesity without serious comorbidity with body mass index (BMI) of 40.0 to 44.9 in adult 01/01/2017  . Essential hypertension 10/18/2016  . Constipation 10/04/2016  . Vitamin D deficiency 10/04/2016  . Prediabetes 10/04/2016  . Morbid obesity ([place]) 09/20/2016  . Other fatigue 09/20/2016  . OSA (obstructive sleep apnea) 02/16/2016  . Right ankle pain 01/20/2016  . Strain of right Achilles tendon 11/30/2015  . Benign hypertension 10/11/2015  . Malignant neoplasm of thyroid gland ([place]) 10/11/2015  . De Quervain's tenosynovitis, bilateral 09/15/2015  . Right shoulder pain 09/15/2015  . Trigger finger, acquired 06/22/2015  . Left wrist pain 06/22/2015  . Thumb injury 06/14/2015  . Chemotherapy-induced peripheral neuropathy ([place]) 05/10/2015  . Postmenopausal osteoporosis 12/08/2014  . Preventative health care 12/01/2014  . DVT (deep venous thrombosis) ([place]) 08/28/2014  . Lymphedema of arm 08/28/2014  . Mucositis due to chemotherapy 06/02/2014  . UTI symptoms 02/17/2014  . Menopausal syndrome (hot flashes) 12/31/2013  . Breast cancer of upper-outer quadrant of left female breast ([place]) 12/26/2013  . Ductal carcinoma in situ (DCIS) of left breast  12/23/2013  . Gingivitis 12/05/2013  . Seasonal allergies 09/24/2013  . Skin lesion 06/03/2013  . Menopause 06/02/2013  . Elevated blood-pressure reading without diagnosis of hypertension 03/05/2013  . Cough 07/24/2012  . Left knee injury 11/02/2011  . Gout 09/19/2011  . Hyperlipidemia 06/02/2011  . Migraine 06/02/2011  . Tinnitus of both ears 02/14/2011  . Left knee pain 09/23/2010  . FIBROMYALGIA 05/10/2010  . Asthma 03/22/2010  . 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 OVERWEIGHT 12/20/2009  . GERD 12/20/2009  . Depression 12/01/2009    Janene Harvey, PT, DPT 12/28/17 12:21 PM   [facility] [address]  [place], [place], [address] Phone: [phone]   Fax:  [phone]  Name: Michelle Kennedy MRN: [id number] Date of Birth: 05-Mar-1956

## 2017-12-31 ENCOUNTER — Ambulatory Visit: Payer: 59 | Admitting: Physical Therapy

## 2017-12-31 DIAGNOSIS — R262 Difficulty in walking, not elsewhere classified: Secondary | ICD-10-CM

## 2017-12-31 DIAGNOSIS — M6281 Muscle weakness (generalized): Secondary | ICD-10-CM

## 2017-12-31 DIAGNOSIS — M25562 Pain in left knee: Secondary | ICD-10-CM | POA: Diagnosis not present

## 2017-12-31 DIAGNOSIS — M25662 Stiffness of left knee, not elsewhere classified: Secondary | ICD-10-CM

## 2017-12-31 NOTE — Therapy (Signed)
[facility] [address]  [place], [place], [address] Phone: [phone]   Fax:  [phone]  Physical Therapy Treatment  Patient Details  Name: Michelle Kennedy MRN: [id number] Date of Birth: June 03, 1955 Referring Provider: Gaynelle Arabian, MD   Encounter Date: 12/31/2017  PT End of Session - 12/31/17 0850    Visit Number  8    Number of Visits  13    Date for PT Re-Evaluation  01/09/18    Authorization Type  [employer]    PT Start Time  0756    PT Stop Time  0843    PT Time Calculation (min)  47 min    Equipment Utilized During Treatment  Gait belt    Activity Tolerance  Patient tolerated treatment well    Behavior During Therapy  [facility] for tasks assessed/performed       Past Medical History:  Diagnosis Date  . Abnormally small mouth   . Achilles tendon disorder, right   . Anemia   . Anxiety   . Arthritis    hips and knees  . Asthma    daily inhaler, prn inhaler and neb.  . Asthma due to environmental allergies   . Breast cancer ([place]) 01/2014   left  . Cancer ([place])   . Chest pain   . COPD (chronic obstructive pulmonary disease) ([place])   . Dental bridge present    upper front and lower right  . Dental crowns present    x 3  . Depression   . Esophageal spasm    reports since her chemo and breast surgery  she developed esophageal spasms and reports this is in the past has casue her 02 to desat in the  70s , denies sycnope in relation to this , does report hx of vertigo as well   . Family history of anesthesia complication    twin brother aspirated and died on OR table, per pt.  . Fibromyalgia   . Gallbladder disease   . GERD (gastroesophageal reflux disease)   . H/O blood clots    had blood to  PICC line   . History of gastric ulcer    as a teenager  . History of seizure age 22   as a reaction to Penicillin - no seizures since  . History of stomach ulcers   . History of thyroid cancer    s/p  thyroidectomy  . Hyperlipidemia   . Hypothyroidism   . Joint pain   . Left knee injury   . Liver disorder    seen when she had her hysterectomy , reports she was told it was  " small lesion" ; but asymptomatic   . Lymphedema    left arm  . Migraines   . Multiple food allergies   . Obesity   . OSA (obstructive sleep apnea) 02/16/2016  . Palpitations    reports no longer experiences  . Personal history of chemotherapy 2015  . Personal history of radiation therapy 2015   Left  . Prediabetes   . Rheumatoid arthritis ([place])   . SOB (shortness of breath)   . Swelling of both ankles   . Tinnitus   . UTI (lower urinary tract infection) 02/17/2014  . Vertigo    last episode  was 2 weeks ago   . Vitamin D deficiency   . Wears contact lenses    left eye only   . Wears hearing aid in both ears  Past Surgical History:  Procedure Laterality Date  . ABDOMINAL HYSTERECTOMY  2004   complete  . ACHILLES TENDON REPAIR Right   . APPENDECTOMY  2004  . BREAST EXCISIONAL BIOPSY    . BREAST LUMPECTOMY Left    2015  . BREAST LUMPECTOMY WITH NEEDLE LOCALIZATION AND AXILLARY SENTINEL LYMPH NODE BX Left 02/23/2014   Procedure: BREAST LUMPECTOMY WITH NEEDLE LOCALIZATION AND AXILLARY SENTINEL LYMPH NODE BIOPSY;  Surgeon: Excell Seltzer, MD;  Location: [facility];  Service: General;  Laterality: Left;  . BREAST SURGERY  2011   left breast biposy  . CHOLECYSTECTOMY  1990  . COLONOSCOPY W/ POLYPECTOMY  06/2009  . EYE SURGERY Right 2013   exc. warts from underneath eyelid  . INCONTINENCE SURGERY  2004  . KNEE ARTHROSCOPY Bilateral    x 6 each knee  . LIGAMENT REPAIR Right    thumb/wrist  . ORIF TOE FRACTURE Right    great toe  . PORTACATH PLACEMENT N/A 03/24/2014   Procedure: INSERTION PORT-A-CATH;  Surgeon: Excell Seltzer, MD;  Location: [facility];  Service: General;  Laterality: N/A;; removed   . THYROIDECTOMY  2000  . TONSILLECTOMY AND ADENOIDECTOMY  2000   . TOTAL KNEE ARTHROPLASTY Left 12/03/2017   Procedure: LEFT TOTAL KNEE ARTHROPLASTY;  Surgeon: Gaynelle Arabian, MD;  Location: [facility];  Service: Orthopedics;  Laterality: Left;  . TUMOR EXCISION     from thoracic spine    There were no vitals filed for this visit.  Subjective Assessment - 12/31/17 0758    Subjective  Reports that she had a phone call with MD on friday who cleared her for pool. Worked in the pool over the weekend with no issues. L HS is not feeling as tight today.    Patient Stated Goals  getting total mobility in L knee and not having as much scar tissue    Currently in Pain?  Yes    Pain Score  3     Pain Location  Knee    Pain Orientation  Left    Pain Descriptors / Indicators  Aching;Dull    Pain Type  Acute pain;Surgical pain                       [facility] Adult PT Treatment/Exercise - 12/31/17 0001      Ambulation/Gait   Ambulation/Gait  Yes    Ambulation/Gait Assistance  5: Supervision    Ambulation Distance (Feet)  90 Feet    Assistive device  None    Gait Pattern  Step-through pattern;Decreased hip/knee flexion - left;Decreased weight shift to left;Decreased step length - left;Decreased stance time - left;Trendelenburg;Antalgic    Ambulation Surface  Level;Indoor    Gait Comments  gait training without AD focusing on increasing L wt shift and controlled knee flexion      Exercises   Exercises  Knee/Hip      Knee/Hip Exercises: Stretches   Gastroc Stretch  Left;2 reps;30 seconds    Gastroc Stretch Limitations  prostretch       Knee/Hip Exercises: Aerobic   Nustep  L3 x 6 min      Knee/Hip Exercises: Standing   Knee Flexion  Strengthening;Left;Right;Limitations;10 reps;2 sets    Knee Flexion Limitations  HS curls at counter top; 10x; 10x with 2#    Terminal Knee Extension  Left;Theraband;10 reps    Theraband Level (Terminal Knee Extension)  Level 4 (Blue)    Terminal Knee Extension Limitations  chair  for support; 10x3" hold    Hip  Abduction  Stengthening;Right;Left;1 set;Knee straight;Limitations;15 reps    Abduction Limitations  at counter top    Forward Step Up  Left;2 sets;Hand Hold: 1;10 reps;Step Height: 6";Step Height: 4";Limitations   cues to decrease anterior trunk lean   Forward Step Up Limitations  counter top for support at R UE    Functional Squat  10 reps;3 seconds;Limitations;2 sets    Functional Squat Limitations  at counter top; 10x; 10x with red TB around knees      Knee/Hip Exercises: Supine   Quad Sets  Strengthening;Left;1 set;Limitations;10 reps    Quad Sets Limitations  heel propped on bolster    Straight Leg Raises  Left;Strengthening;10 reps;Limitations    Straight Leg Raises Limitations  cues for quad set before each rep      Manual Therapy   Manual Therapy  Joint mobilization    Joint Mobilization  L patellar mobs grade III to tolerance; L knee extension mobs grade III with ankle propped on bolster               PT Short Term Goals - 12/18/17 1712      PT SHORT TERM GOAL #1   Title  Patient to be independent with initial HEP.    Time  2    Period  Weeks    Status  Achieved        PT Long Term Goals - 12/19/17 0857      PT LONG TERM GOAL #1   Title  Patient to be independent with advanced HEP.    Time  4    Period  Weeks    Status  On-going      PT LONG TERM GOAL #2   Title  Patient to demonstrate L knee AROM/PROM 0-120 degrees.    Time  4    Period  Weeks    Status  On-going      PT LONG TERM GOAL #3   Title  Patient to demonstrate B LE strength >=4+/5.    Time  4    Period  Weeks    Status  On-going      PT LONG TERM GOAL #4   Title  Patient to demonstrate equal step length, weight shift, and upright trunk with ambulation with LRAD.    Time  4    Period  Weeks    Status  On-going      PT LONG TERM GOAL #5   Title  Patient to report tolerance of 1 hour of walking without pain limiting.     Time  4    Period  Weeks    Status  On-going      PT LONG  TERM GOAL #6   Title  Patient to demonstrate L LE SLR without quad lag.     Time  4    Period  Weeks    Status  On-going            Plan - 12/31/17 4709    Clinical Impression Statement  Patient arrived to session with no new complaints. Reports she was cleared for pool by MD. Good tolerance of all standing exercises this date. Introduced anterior step-ups with R UE support at counter top- cue provided to maintain upright trunk. Patient mildly fatigued with this activity but tolerable. Focused on achieving full extension with manual therapy- patient tolerated L patella and L knee extension mobilizations. Followed manual therapy with quad sets to reinforce  extension. Ended session with gait training without AD, focusing on increasing L weight shift and controlled knee flexion throughout. Patient without c/o pain at end of session.     Clinical Impairments Affecting Rehab Potential  L UE and LE lymphedema- no vasopneumatic or BP on L side, latex allergy, hx of breast, uterine, thyroid CA with chemo and radiation therapies, R achilles tendon disorder, anemia, asthma, chest pain, COPD, esophageal spasm, fibromyalga, gallbladder disease, hx of seizures, HLD, hypothyroidism, liver disorder, migraines, palpitations, prediabetes, RA, SOB, tinnitus, vertigo    PT Treatment/Interventions  ADLs/Self Care Home Management;Cryotherapy;Moist Heat;DME Instruction;Gait training;Stair training;Functional mobility training;Therapeutic activities;Therapeutic exercise;Manual techniques;Patient/family education;Orthotic Fit/Training;Neuromuscular re-education;Balance training;Scar mobilization;Passive range of motion;Dry needling;Energy conservation;Splinting;Taping    PT Next Visit Plan  anterior step up/down    Consulted and Agree with Plan of Care  Patient       Patient will benefit from skilled therapeutic intervention in order to improve the following deficits and impairments:  Decreased endurance, Decreased  scar mobility, Decreased activity tolerance, Decreased strength, Pain, Decreased balance, Decreased mobility, Difficulty walking, Decreased range of motion, Impaired flexibility, Postural dysfunction  Visit Diagnosis: Acute pain of left knee  Stiffness of left knee, not elsewhere classified  Muscle weakness (generalized)  Difficulty in walking, not elsewhere classified     Problem List Patient Active Problem List   Diagnosis Date Noted  . OA (osteoarthritis) of knee 12/03/2017  . Class 3 obesity without serious comorbidity with body mass index (BMI) of 40.0 to 44.9 in adult 01/01/2017  . Essential hypertension 10/18/2016  . Constipation 10/04/2016  . Vitamin D deficiency 10/04/2016  . Prediabetes 10/04/2016  . Morbid obesity ([place]) 09/20/2016  . Other fatigue 09/20/2016  . OSA (obstructive sleep apnea) 02/16/2016  . Right ankle pain 01/20/2016  . Strain of right Achilles tendon 11/30/2015  . Benign hypertension 10/11/2015  . Malignant neoplasm of thyroid gland ([place]) 10/11/2015  . De Quervain's tenosynovitis, bilateral 09/15/2015  . Right shoulder pain 09/15/2015  . Trigger finger, acquired 06/22/2015  . Left wrist pain 06/22/2015  . Thumb injury 06/14/2015  . Chemotherapy-induced peripheral neuropathy ([place]) 05/10/2015  . Postmenopausal osteoporosis 12/08/2014  . Preventative health care 12/01/2014  . DVT (deep venous thrombosis) ([place]) 08/28/2014  . Lymphedema of arm 08/28/2014  . Mucositis due to chemotherapy 06/02/2014  . UTI symptoms 02/17/2014  . Menopausal syndrome (hot flashes) 12/31/2013  . Breast cancer of upper-outer quadrant of left female breast ([place]) 12/26/2013  . 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 Gingivitis 12/05/2013  . Seasonal allergies 09/24/2013  . Skin lesion 06/03/2013  . Menopause 06/02/2013  . Elevated blood-pressure reading without diagnosis of hypertension 03/05/2013  . Cough 07/24/2012  . Left knee injury 11/02/2011   . Gout 09/19/2011  . Hyperlipidemia 06/02/2011  . Migraine 06/02/2011  . Tinnitus of both ears 02/14/2011  . Left knee pain 09/23/2010  . FIBROMYALGIA 05/10/2010  . Asthma 03/22/2010  . 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 OVERWEIGHT 12/20/2009  . GERD 12/20/2009  . Depression 12/01/2009    Janene Harvey, PT, DPT 12/31/17 8:52 AM   [facility] [address]  [place], [place], [address] Phone: [phone]   Fax:  [phone]  Name: Michelle Kennedy MRN: [id number] Date of Birth: 1955-10-28

## 2018-01-02 ENCOUNTER — Ambulatory Visit: Payer: 59 | Admitting: Physical Therapy

## 2018-01-02 ENCOUNTER — Encounter: Payer: Self-pay | Admitting: Physical Therapy

## 2018-01-02 DIAGNOSIS — M25662 Stiffness of left knee, not elsewhere classified: Secondary | ICD-10-CM

## 2018-01-02 DIAGNOSIS — M6281 Muscle weakness (generalized): Secondary | ICD-10-CM

## 2018-01-02 DIAGNOSIS — M25562 Pain in left knee: Secondary | ICD-10-CM

## 2018-01-02 DIAGNOSIS — R262 Difficulty in walking, not elsewhere classified: Secondary | ICD-10-CM

## 2018-01-02 NOTE — Therapy (Signed)
[facility] [address]  [place], [place], [address] Phone: [phone]   Fax:  [phone]  Physical Therapy Treatment  Patient Details  Name: Michelle Kennedy MRN: [id number] Date of Birth: 06/20/1955 Referring Provider: Gaynelle Arabian, MD   Encounter Date: 01/02/2018  PT End of Session - 01/02/18 1235    Visit Number  9    Number of Visits  13    Date for PT Re-Evaluation  01/09/18    Authorization Type  [employer]    PT Start Time  0756    PT Stop Time  0841    PT Time Calculation (min)  45 min    Equipment Utilized During Treatment  Gait belt    Activity Tolerance  Patient tolerated treatment well    Behavior During Therapy  [facility] for tasks assessed/performed       Past Medical History:  Diagnosis Date  . Abnormally small mouth   . Achilles tendon disorder, right   . Anemia   . Anxiety   . Arthritis    hips and knees  . Asthma    daily inhaler, prn inhaler and neb.  . Asthma due to environmental allergies   . Breast cancer ([place]) 01/2014   left  . Cancer ([place])   . Chest pain   . COPD (chronic obstructive pulmonary disease) ([place])   . Dental bridge present    upper front and lower right  . Dental crowns present    x 3  . Depression   . Esophageal spasm    reports since her chemo and breast surgery  she developed esophageal spasms and reports this is in the past has casue her 02 to desat in the  70s , denies sycnope in relation to this , does report hx of vertigo as well   . Family history of anesthesia complication    twin brother aspirated and died on OR table, per pt.  . Fibromyalgia   . Gallbladder disease   . GERD (gastroesophageal reflux disease)   . H/O blood clots    had blood to  PICC line   . History of gastric ulcer    as a teenager  . History of seizure age 26   as a reaction to Penicillin - no seizures since  . History of stomach ulcers   . History of thyroid cancer    s/p  thyroidectomy  . Hyperlipidemia   . Hypothyroidism   . Joint pain   . Left knee injury   . Liver disorder    seen when she had her hysterectomy , reports she was told it was  " small lesion" ; but asymptomatic   . Lymphedema    left arm  . Migraines   . Multiple food allergies   . Obesity   . OSA (obstructive sleep apnea) 02/16/2016  . Palpitations    reports no longer experiences  . Personal history of chemotherapy 2015  . Personal history of radiation therapy 2015   Left  . Prediabetes   . Rheumatoid arthritis ([place])   . SOB (shortness of breath)   . Swelling of both ankles   . Tinnitus   . UTI (lower urinary tract infection) 02/17/2014  . Vertigo    last episode  was 2 weeks ago   . Vitamin D deficiency   . Wears contact lenses    left eye only   . Wears hearing aid in both ears  Past Surgical History:  Procedure Laterality Date  . ABDOMINAL HYSTERECTOMY  2004   complete  . ACHILLES TENDON REPAIR Right   . APPENDECTOMY  2004  . BREAST EXCISIONAL BIOPSY    . BREAST LUMPECTOMY Left    2015  . BREAST LUMPECTOMY WITH NEEDLE LOCALIZATION AND AXILLARY SENTINEL LYMPH NODE BX Left 02/23/2014   Procedure: BREAST LUMPECTOMY WITH NEEDLE LOCALIZATION AND AXILLARY SENTINEL LYMPH NODE BIOPSY;  Surgeon: Excell Seltzer, MD;  Location: [facility];  Service: General;  Laterality: Left;  . BREAST SURGERY  2011   left breast biposy  . CHOLECYSTECTOMY  1990  . COLONOSCOPY W/ POLYPECTOMY  06/2009  . EYE SURGERY Right 2013   exc. warts from underneath eyelid  . INCONTINENCE SURGERY  2004  . KNEE ARTHROSCOPY Bilateral    x 6 each knee  . LIGAMENT REPAIR Right    thumb/wrist  . ORIF TOE FRACTURE Right    great toe  . PORTACATH PLACEMENT N/A 03/24/2014   Procedure: INSERTION PORT-A-CATH;  Surgeon: Excell Seltzer, MD;  Location: [facility];  Service: General;  Laterality: N/A;; removed   . THYROIDECTOMY  2000  . TONSILLECTOMY AND ADENOIDECTOMY  2000   . TOTAL KNEE ARTHROPLASTY Left 12/03/2017   Procedure: LEFT TOTAL KNEE ARTHROPLASTY;  Surgeon: Gaynelle Arabian, MD;  Location: [facility];  Service: Orthopedics;  Laterality: Left;  . TUMOR EXCISION     from thoracic spine    There were no vitals filed for this visit.  Subjective Assessment - 01/02/18 0757    Subjective  Reports her can eis driving her crazy. Feels like she wants to lean over on it. Did not swim yesterday.     Pertinent History  L UE and LE lymphedema- no vasopneumatic or BP on L side, latex allergy, hx of breast, uterine, thyroid CA with chemo and radiation therapies, R achilles tendon disorder, anemia, asthma, chest pain, COPD, esophageal spasm, fibromyalga, gallbladder disease, hx of seizures, HLD, hypothyroidism, liver disorder, migraines, palpitations, prediabetes, RA, SOB, tinnitus, vertigo    Patient Stated Goals  getting total mobility in L knee and not having as much scar tissue    Currently in Pain?  No/denies    Pain Orientation  --    Pain Descriptors / Indicators  --    Pain Type  --                       [facility] Adult PT Treatment/Exercise - 01/02/18 0001      Ambulation/Gait   Ambulation/Gait  Yes    Ambulation/Gait Assistance  5: Supervision    Ambulation Distance (Feet)  180 Feet    Assistive device  None    Gait Pattern  Step-through pattern;Decreased hip/knee flexion - left;Decreased stance time - left;Trendelenburg;Decreased step length - right    Gait Comments  gait training focusing on increasing R step length and avoiding trunk lean      Exercises   Exercises  Knee/Hip      Knee/Hip Exercises: Stretches   Passive Hamstring Stretch  Left;2 reps;30 seconds;Limitations    Passive Hamstring Stretch Limitations  strap supine    Quad Stretch  Left;2 reps;Limitations;30 seconds    Quad Stretch Limitations  prone strap      Knee/Hip Exercises: Aerobic   Nustep  L3 x 6 min      Knee/Hip Exercises: Machines for Strengthening   Cybex  Knee Extension  B LEs 10x10#   focuse on slow eccentric  phase   Cybex Knee Flexion  B LEs 10x20#    cues for slow eccentric phase     Knee/Hip Exercises: Standing   Lateral Step Up  1 set;Left;Hand Hold: 0;Step Height: 4";10 reps;Limitations    Lateral Step Up Limitations  cues for foot placement and avoiding ant trunk lean    Forward Step Up  Left;10 reps;Step Height: 6";Limitations;1 set;Hand Hold: 0    Forward Step Up Limitations  cues to avoid ant trunk lean      Knee/Hip Exercises: Supine   Quad Sets  Strengthening;Left;1 set;Limitations;10 reps    Quad Sets Limitations  10x10" heel propped on bolster      Manual Therapy   Manual Therapy  Joint mobilization;Soft tissue mobilization    Joint Mobilization  L patellar mobs grade III to tolerance; L knee extension mobs grade III with ankle propped on bolster    Soft tissue mobilization  gentle L distal lateral and medial HS- patient TTP and with palpable restriction- possible fluid build up             PT Education - 01/02/18 1235    Education Details  update to [employer]) Educated  Patient    Methods  Explanation;Demonstration;Tactile cues;Verbal cues    Comprehension  Returned demonstration;Verbalized understanding       PT Short Term Goals - 12/18/17 1712      PT SHORT TERM GOAL #1   Title  Patient to be independent with initial HEP.    Time  2    Period  Weeks    Status  Achieved        PT Long Term Goals - 12/19/17 0857      PT LONG TERM GOAL #1   Title  Patient to be independent with advanced HEP.    Time  4    Period  Weeks    Status  On-going      PT LONG TERM GOAL #2   Title  Patient to demonstrate L knee AROM/PROM 0-120 degrees.    Time  4    Period  Weeks    Status  On-going      PT LONG TERM GOAL #3   Title  Patient to demonstrate B LE strength >=4+/5.    Time  4    Period  Weeks    Status  On-going      PT LONG TERM GOAL #4   Title  Patient to demonstrate equal step length,  weight shift, and upright trunk with ambulation with LRAD.    Time  4    Period  Weeks    Status  On-going      PT LONG TERM GOAL #5   Title  Patient to report tolerance of 1 hour of walking without pain limiting.     Time  4    Period  Weeks    Status  On-going      PT LONG TERM GOAL #6   Title  Patient to demonstrate L LE SLR without quad lag.     Time  4    Period  Weeks    Status  On-going            Plan - 01/02/18 1235    Clinical Impression Statement  Patient reporting no pain today. Mentioning persisting tightness in L posterior knee. Tolerated gentle STM to L medial and lateral HS with tenderness mostly centered directly behind knee- possibly fluid accumulation rather than muscular. Good  tolerance of manual therapy with improvement in L knee extension observed. Followed manual therapy with subsequently with quad sets. Progressed LE strengthening with machine resisted knee flexion and extension this date. Patient requesting handout for lateral step ups- administered updated HEP with step up at counter top support to decrease risk of falls. Patient reporting incidence of sliding off the bed last weekend and unable to get up. Reporting that she called neighbor to help her up. Reporting no injuries from this event.     Clinical Impairments Affecting Rehab Potential  L UE and LE lymphedema- no vasopneumatic or BP on L side, latex allergy, hx of breast, uterine, thyroid CA with chemo and radiation therapies, R achilles tendon disorder, anemia, asthma, chest pain, COPD, esophageal spasm, fibromyalga, gallbladder disease, hx of seizures, HLD, hypothyroidism, liver disorder, migraines, palpitations, prediabetes, RA, SOB, tinnitus, vertigo    PT Treatment/Interventions  ADLs/Self Care Home Management;Cryotherapy;Moist Heat;DME Instruction;Gait training;Stair training;Functional mobility training;Therapeutic activities;Therapeutic exercise;Manual techniques;Patient/family education;Orthotic  Fit/Training;Neuromuscular re-education;Balance training;Scar mobilization;Passive range of motion;Dry needling;Energy conservation;Splinting;Taping    Consulted and Agree with Plan of Care  Patient       Patient will benefit from skilled therapeutic intervention in order to improve the following deficits and impairments:  Decreased endurance, Decreased scar mobility, Decreased activity tolerance, Decreased strength, Pain, Decreased balance, Decreased mobility, Difficulty walking, Decreased range of motion, Impaired flexibility, Postural dysfunction  Visit Diagnosis: Acute pain of left knee  Stiffness of left knee, not elsewhere classified  Muscle weakness (generalized)  Difficulty in walking, not elsewhere classified     Problem List Patient Active Problem List   Diagnosis Date Noted  . OA (osteoarthritis) of knee 12/03/2017  . Class 3 obesity without serious comorbidity with body mass index (BMI) of 40.0 to 44.9 in adult 01/01/2017  . Essential hypertension 10/18/2016  . Constipation 10/04/2016  . Vitamin D deficiency 10/04/2016  . Prediabetes 10/04/2016  . Morbid obesity ([place]) 09/20/2016  . Other fatigue 09/20/2016  . OSA (obstructive sleep apnea) 02/16/2016  . Right ankle pain 01/20/2016  . Strain of right Achilles tendon 11/30/2015  . Benign hypertension 10/11/2015  . Malignant neoplasm of thyroid gland ([place]) 10/11/2015  . De Quervain's tenosynovitis, bilateral 09/15/2015  . Right shoulder pain 09/15/2015  . Trigger finger, acquired 06/22/2015  . Left wrist pain 06/22/2015  . Thumb injury 06/14/2015  . Chemotherapy-induced peripheral neuropathy ([place]) 05/10/2015  . Postmenopausal osteoporosis 12/08/2014  . Preventative health care 12/01/2014  . DVT (deep venous thrombosis) ([place]) 08/28/2014  . Lymphedema of arm 08/28/2014  . Mucositis due to chemotherapy 06/02/2014  . UTI symptoms 02/17/2014  . Menopausal syndrome (hot flashes) 12/31/2013  . Breast cancer of  upper-outer quadrant of left female breast ([place]) 12/26/2013  . 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 Gingivitis 12/05/2013  . Seasonal allergies 09/24/2013  . Skin lesion 06/03/2013  . Menopause 06/02/2013  . Elevated blood-pressure reading without diagnosis of hypertension 03/05/2013  . Cough 07/24/2012  . Left knee injury 11/02/2011  . Gout 09/19/2011  . Hyperlipidemia 06/02/2011  . Migraine 06/02/2011  . Tinnitus of both ears 02/14/2011  . Left knee pain 09/23/2010  . FIBROMYALGIA 05/10/2010  . Asthma 03/22/2010  . 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 OVERWEIGHT 12/20/2009  . GERD 12/20/2009  . Depression 12/01/2009   Janene Harvey, PT, DPT 01/02/18 12:44 PM   [facility] [address]  [place], [place], [address] Phone: [phone]   Fax:  [phone]  Name: Nai Borromeo MRN: [id number] Date of Birth: Apr 23, 1956

## 2018-01-04 ENCOUNTER — Ambulatory Visit: Payer: 59 | Admitting: Physical Therapy

## 2018-01-04 DIAGNOSIS — M25562 Pain in left knee: Secondary | ICD-10-CM | POA: Diagnosis not present

## 2018-01-04 DIAGNOSIS — M25662 Stiffness of left knee, not elsewhere classified: Secondary | ICD-10-CM

## 2018-01-04 DIAGNOSIS — M6281 Muscle weakness (generalized): Secondary | ICD-10-CM

## 2018-01-04 DIAGNOSIS — R262 Difficulty in walking, not elsewhere classified: Secondary | ICD-10-CM

## 2018-01-04 NOTE — Therapy (Signed)
[facility] [address]  [place], [place], [address] Phone: [phone]   Fax:  [phone]  Physical Therapy Progress Note  Patient Details  Name: Michelle Kennedy MRN: [id number] Date of Birth: August 23, 1955 Referring Provider: Gaynelle Arabian, MD   Progress Note Reporting Period 12/12/17 to 01/04/18  See note below for Objective Data and Assessment of Progress/Goals.    Encounter Date: 01/04/2018  PT End of Session - 01/04/18 1028    Visit Number  10    Number of Visits  13    Date for PT Re-Evaluation  01/09/18    Authorization Type  [employer]    PT Start Time  0843    PT Stop Time  0927    PT Time Calculation (min)  44 min    Activity Tolerance  Patient tolerated treatment well    Behavior During Therapy  [facility] for tasks assessed/performed       Past Medical History:  Diagnosis Date  . Abnormally small mouth   . Achilles tendon disorder, right   . Anemia   . Anxiety   . Arthritis    hips and knees  . Asthma    daily inhaler, prn inhaler and neb.  . Asthma due to environmental allergies   . Breast cancer ([place]) 01/2014   left  . Cancer ([place])   . Chest pain   . COPD (chronic obstructive pulmonary disease) ([place])   . Dental bridge present    upper front and lower right  . Dental crowns present    x 3  . Depression   . Esophageal spasm    reports since her chemo and breast surgery  she developed esophageal spasms and reports this is in the past has casue her 02 to desat in the  70s , denies sycnope in relation to this , does report hx of vertigo as well   . Family history of anesthesia complication    twin brother aspirated and died on OR table, per pt.  . Fibromyalgia   . Gallbladder disease   . GERD (gastroesophageal reflux disease)   . H/O blood clots    had blood to  PICC line   . History of gastric ulcer    as a teenager  . History of seizure age 79   as a reaction to Penicillin - no seizures  since  . History of stomach ulcers   . History of thyroid cancer    s/p thyroidectomy  . Hyperlipidemia   . Hypothyroidism   . Joint pain   . Left knee injury   . Liver disorder    seen when she had her hysterectomy , reports she was told it was  " small lesion" ; but asymptomatic   . Lymphedema    left arm  . Migraines   . Multiple food allergies   . Obesity   . OSA (obstructive sleep apnea) 02/16/2016  . Palpitations    reports no longer experiences  . Personal history of chemotherapy 2015  . Personal history of radiation therapy 2015   Left  . Prediabetes   . Rheumatoid arthritis ([place])   . SOB (shortness of breath)   . Swelling of both ankles   . Tinnitus   . UTI (lower urinary tract infection) 02/17/2014  . Vertigo    last episode  was 2 weeks ago   . Vitamin D deficiency   . Wears contact lenses    left eye  only   . Wears hearing aid in both ears     Past Surgical History:  Procedure Laterality Date  . ABDOMINAL HYSTERECTOMY  2004   complete  . ACHILLES TENDON REPAIR Right   . APPENDECTOMY  2004  . BREAST EXCISIONAL BIOPSY    . BREAST LUMPECTOMY Left    2015  . BREAST LUMPECTOMY WITH NEEDLE LOCALIZATION AND AXILLARY SENTINEL LYMPH NODE BX Left 02/23/2014   Procedure: BREAST LUMPECTOMY WITH NEEDLE LOCALIZATION AND AXILLARY SENTINEL LYMPH NODE BIOPSY;  Surgeon: Excell Seltzer, MD;  Location: [facility];  Service: General;  Laterality: Left;  . BREAST SURGERY  2011   left breast biposy  . CHOLECYSTECTOMY  1990  . COLONOSCOPY W/ POLYPECTOMY  06/2009  . EYE SURGERY Right 2013   exc. warts from underneath eyelid  . INCONTINENCE SURGERY  2004  . KNEE ARTHROSCOPY Bilateral    x 6 each knee  . LIGAMENT REPAIR Right    thumb/wrist  . ORIF TOE FRACTURE Right    great toe  . PORTACATH PLACEMENT N/A 03/24/2014   Procedure: INSERTION PORT-A-CATH;  Surgeon: Excell Seltzer, MD;  Location: [facility];  Service: General;  Laterality: N/A;;  removed   . THYROIDECTOMY  2000  . TONSILLECTOMY AND ADENOIDECTOMY  2000  . TOTAL KNEE ARTHROPLASTY Left 12/03/2017   Procedure: LEFT TOTAL KNEE ARTHROPLASTY;  Surgeon: Gaynelle Arabian, MD;  Location: [facility];  Service: Orthopedics;  Laterality: Left;  . TUMOR EXCISION     from thoracic spine    There were no vitals filed for this visit.  Subjective Assessment - 01/04/18 0845    Subjective  Reports L posterior knee tightness is better today. Iced it a lot. Reports she can feel her knee cap moving more now. Did some walking outside. Reports 85% improvement since initial eval. Still feels like knee still isnt quite straight.     Patient is accompained by:  Family member    Pertinent History  L UE and LE lymphedema- no vasopneumatic or BP on L side, latex allergy, hx of breast, uterine, thyroid CA with chemo and radiation therapies, R achilles tendon disorder, anemia, asthma, chest pain, COPD, esophageal spasm, fibromyalga, gallbladder disease, hx of seizures, HLD, hypothyroidism, liver disorder, migraines, palpitations, prediabetes, RA, SOB, tinnitus, vertigo    Patient Stated Goals  getting total mobility in L knee and not having as much scar tissue    Currently in Pain?  No/denies         [facility] PT Assessment - 01/04/18 0001      Observation/Other Assessments   Focus on Therapeutic Outcomes (FOTO)   Knee: 36 (61% limited, 50% predicted)      AROM   Right Knee Extension  10    Right Knee Flexion  100      PROM   Right Knee Extension  5    Right Knee Flexion  105      Strength   Right/Left Hip  Right;Left    Right Hip Flexion  4+/5    Right Hip ABduction  4/5    Right Hip ADduction  4/5    Left Hip Flexion  4+/5    Left Hip ABduction  4/5    Left Hip ADduction  4/5    Right/Left Knee  Right;Left    Right Knee Flexion  4/5    Right Knee Extension  4+/5    Left Knee Flexion  5/5    Left Knee Extension  5/5  Right/Left Ankle  Right;Left    Right Ankle Dorsiflexion  4+/5     Right Ankle Plantar Flexion  4+/5    Left Ankle Dorsiflexion  4+/5    Left Ankle Plantar Flexion  4+/5                   [facility] Adult PT Treatment/Exercise - 01/04/18 0001      Ambulation/Gait   Ambulation/Gait  Yes    Ambulation/Gait Assistance  5: Supervision    Ambulation Distance (Feet)  180 Feet    Assistive device  None    Gait Pattern  Step-through pattern;Decreased hip/knee flexion - left;Decreased stance time - left;Trendelenburg;Decreased step length - right    Gait Comments  gait training without SPC with focus on TKE at heel strike; pt still exhibiting L trunk lean and L trendelenberg      Exercises   Exercises  Knee/Hip      Knee/Hip Exercises: Stretches   Passive Hamstring Stretch  Left;2 reps;30 seconds;Limitations    Passive Hamstring Stretch Limitations  strap supine    Quad Stretch  Left;2 reps;Limitations    Quad Stretch Limitations  prone strap; 2x 45"    Gastroc Stretch  Left;2 reps;30 seconds    Gastroc Stretch Limitations  prostretch at counter       Knee/Hip Exercises: Aerobic   Stationary Bike  L1 x 25mn- partial revolutions      Knee/Hip Exercises: Standing   Hip Abduction  Stengthening;Right;Left;1 set;Knee straight;Limitations;10 reps   VC/TCs to avoid hip hiking   Abduction Limitations  at counter top    Wall Squat  1 set;10 reps;Limitations   pt reporting instability in R LE   Wall Squat Limitations  to tolerance      Knee/Hip Exercises: Supine   Straight Leg Raises  Left;Strengthening;10 reps;Limitations    Straight Leg Raises Limitations  cues for quad set before each rep      Manual Therapy   Manual Therapy  Soft tissue mobilization;Joint mobilization    Manual therapy comments  supine    Joint Mobilization  grade II L knee extension mobs to tolerance    Soft tissue mobilization  gentle L distal lateral and medial HS-palpable trigger pts and tenderness   pt requesting to stop d/t pain              PT Short Term  Goals - 01/04/18 00932     PT SHORT TERM GOAL #1   Title  Patient to be independent with initial HEP.    Time  2    Period  Weeks    Status  Achieved        PT Long Term Goals - 01/04/18 06712     PT LONG TERM GOAL #1   Title  Patient to be independent with advanced HEP.    Time  4    Period  Weeks    Status  Partially Met   met for current     PT LONG TERM GOAL #2   Title  Patient to demonstrate L knee AROM/PROM 0-120 degrees.    Time  4    Period  Weeks    Status  On-going   demonstrating L knee AROM 10-100, PROM 5-105 degrees     PT LONG TERM GOAL #3   Title  Patient to demonstrate B LE strength >=4+/5.    Time  4    Period  Weeks    Status  Partially Met  demonstrated improvements in L hip flexion, abduction, adduction, L knee flexion, extension     PT LONG TERM GOAL #4   Title  Patient to demonstrate equal step length, weight shift, and upright trunk with ambulation with LRAD.    Time  4    Period  Weeks    Status  On-going   still demonstrating L trendelenberg and L trunk lean with gait training without AD; improved heel strike, step length, and weight shift     PT LONG TERM GOAL #5   Title  Patient to report tolerance of 1 hour of walking without pain limiting.     Time  4    Period  Weeks    Status  On-going   20 min limited by fatigue     PT LONG TERM GOAL #6   Title  Patient to demonstrate L LE SLR without quad lag.     Time  4    Period  Weeks    Status  On-going   very mild quad lag evident           Plan - 01/04/18 1028    Clinical Impression Statement  Patient arrived to session with report of improvement in tightness in L posterior knee after icing at home. Also reported 85% improvement overall since initial eval. Performed gentle STM to L distal HS-palpable trigger pts and tenderness noted. Patient reported pain and could not tolerate- discontinued. Decreased tolerance note to gentle grade II extension mobilizations to L knee.  Measurements taken this date- patient showing improvements in L knee extension, however flexion limited compared to initial eval despite patient's report of compliance with consisted knee flexion PROM at home. Patient demonstrated improvements in L hip flexion, abduction, adduction, L knee flexion, extension strength. Still demonstrating L Trendelenburg and L trunk lean with gait training without AD; improved heel strike, step length, and weight shift. Focused on hamstring and quad stretching after manual therapy. Introduced [employer]- patient reporting instability of R knee d/t pain; advised to avoid painful and deep ROM. Patient without pain at end of session.    Clinical Impairments Affecting Rehab Potential  L UE and LE lymphedema- no vasopneumatic or BP on L side, latex allergy, hx of breast, uterine, thyroid CA with chemo and radiation therapies, R achilles tendon disorder, anemia, asthma, chest pain, COPD, esophageal spasm, fibromyalga, gallbladder disease, hx of seizures, HLD, hypothyroidism, liver disorder, migraines, palpitations, prediabetes, RA, SOB, tinnitus, vertigo    PT Treatment/Interventions  ADLs/Self Care Home Management;Cryotherapy;Moist Heat;DME Instruction;Gait training;Stair training;Functional mobility training;Therapeutic activities;Therapeutic exercise;Manual techniques;Patient/family education;Orthotic Fit/Training;Neuromuscular re-education;Balance training;Scar mobilization;Passive range of motion;Dry needling;Energy conservation;Splinting;Taping    Consulted and Agree with Plan of Care  Patient       Patient will benefit from skilled therapeutic intervention in order to improve the following deficits and impairments:  Decreased endurance, Decreased scar mobility, Decreased activity tolerance, Decreased strength, Pain, Decreased balance, Decreased mobility, Difficulty walking, Decreased range of motion, Impaired flexibility, Postural dysfunction  Visit Diagnosis: Acute pain  of left knee  Stiffness of left knee, not elsewhere classified  Muscle weakness (generalized)  Difficulty in walking, not elsewhere classified     Problem List Patient Active Problem List   Diagnosis Date Noted  . OA (osteoarthritis) of knee 12/03/2017  . Class 3 obesity without serious comorbidity with body mass index (BMI) of 40.0 to 44.9 in adult 01/01/2017  . Essential hypertension 10/18/2016  . Constipation 10/04/2016  . Vitamin D deficiency 10/04/2016  . Prediabetes 10/04/2016  .  Morbid obesity ([place]) 09/20/2016  . Other fatigue 09/20/2016  . OSA (obstructive sleep apnea) 02/16/2016  . Right ankle pain 01/20/2016  . Strain of right Achilles tendon 11/30/2015  . Benign hypertension 10/11/2015  . Malignant neoplasm of thyroid gland ([place]) 10/11/2015  . De Quervain's tenosynovitis, bilateral 09/15/2015  . Right shoulder pain 09/15/2015  . Trigger finger, acquired 06/22/2015  . Left wrist pain 06/22/2015  . Thumb injury 06/14/2015  . Chemotherapy-induced peripheral neuropathy ([place]) 05/10/2015  . Postmenopausal osteoporosis 12/08/2014  . Preventative health care 12/01/2014  . DVT (deep venous thrombosis) ([place]) 08/28/2014  . Lymphedema of arm 08/28/2014  . Mucositis due to chemotherapy 06/02/2014  . UTI symptoms 02/17/2014  . Menopausal syndrome (hot flashes) 12/31/2013  . Breast cancer of upper-outer quadrant of left female breast ([place]) 12/26/2013  . 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 Gingivitis 12/05/2013  . Seasonal allergies 09/24/2013  . Skin lesion 06/03/2013  . Menopause 06/02/2013  . Elevated blood-pressure reading without diagnosis of hypertension 03/05/2013  . Cough 07/24/2012  . Left knee injury 11/02/2011  . Gout 09/19/2011  . Hyperlipidemia 06/02/2011  . Migraine 06/02/2011  . Tinnitus of both ears 02/14/2011  . Left knee pain 09/23/2010  . FIBROMYALGIA 05/10/2010  . Asthma 03/22/2010  . Postsurgical hypothyroidism  12/20/2009  . OVERWEIGHT 12/20/2009  . GERD 12/20/2009  . Depression 12/01/2009     Janene Harvey, PT, DPT 01/04/18 10:36 AM   [facility] [address]  [place], [place], [address] Phone: [phone]   Fax:  [phone]  Name: Michelle Kennedy MRN: [id number] Date of Birth: 03-16-1956

## 2018-01-07 ENCOUNTER — Encounter: Payer: Self-pay | Admitting: Physical Therapy

## 2018-01-07 ENCOUNTER — Ambulatory Visit: Payer: 59 | Admitting: Physical Therapy

## 2018-01-07 DIAGNOSIS — M6281 Muscle weakness (generalized): Secondary | ICD-10-CM

## 2018-01-07 DIAGNOSIS — M25562 Pain in left knee: Secondary | ICD-10-CM | POA: Diagnosis not present

## 2018-01-07 DIAGNOSIS — R262 Difficulty in walking, not elsewhere classified: Secondary | ICD-10-CM

## 2018-01-07 DIAGNOSIS — M25662 Stiffness of left knee, not elsewhere classified: Secondary | ICD-10-CM

## 2018-01-07 NOTE — Therapy (Signed)
[facility] [address]  [place], [place], [address] Phone: [phone]   Fax:  [phone]  Physical Therapy Treatment  Patient Details  Name: Michelle Kennedy MRN: [id number] Date of Birth: 1955-09-12 Referring Provider: Gaynelle Arabian, MD   Encounter Date: 01/07/2018  PT End of Session - 01/07/18 0952    Visit Number  11    Number of Visits  17    Date for PT Re-Evaluation  01/28/18    Authorization Type  [employer]    PT Start Time  0174    PT Stop Time  0839    PT Time Calculation (min)  44 min    Activity Tolerance  Patient tolerated treatment well    Behavior During Therapy  [facility] for tasks assessed/performed       Past Medical History:  Diagnosis Date  . Abnormally small mouth   . Achilles tendon disorder, right   . Anemia   . Anxiety   . Arthritis    hips and knees  . Asthma    daily inhaler, prn inhaler and neb.  . Asthma due to environmental allergies   . Breast cancer ([place]) 01/2014   left  . Cancer ([place])   . Chest pain   . COPD (chronic obstructive pulmonary disease) ([place])   . Dental bridge present    upper front and lower right  . Dental crowns present    x 3  . Depression   . Esophageal spasm    reports since her chemo and breast surgery  she developed esophageal spasms and reports this is in the past has casue her 02 to desat in the  70s , denies sycnope in relation to this , does report hx of vertigo as well   . Family history of anesthesia complication    twin brother aspirated and died on OR table, per pt.  . Fibromyalgia   . Gallbladder disease   . GERD (gastroesophageal reflux disease)   . H/O blood clots    had blood to  PICC line   . History of gastric ulcer    as a teenager  . History of seizure age 64   as a reaction to Penicillin - no seizures since  . History of stomach ulcers   . History of thyroid cancer    s/p thyroidectomy  . Hyperlipidemia   . Hypothyroidism   .  Joint pain   . Left knee injury   . Liver disorder    seen when she had her hysterectomy , reports she was told it was  " small lesion" ; but asymptomatic   . Lymphedema    left arm  . Migraines   . Multiple food allergies   . Obesity   . OSA (obstructive sleep apnea) 02/16/2016  . Palpitations    reports no longer experiences  . Personal history of chemotherapy 2015  . Personal history of radiation therapy 2015   Left  . Prediabetes   . Rheumatoid arthritis ([place])   . SOB (shortness of breath)   . Swelling of both ankles   . Tinnitus   . UTI (lower urinary tract infection) 02/17/2014  . Vertigo    last episode  was 2 weeks ago   . Vitamin D deficiency   . Wears contact lenses    left eye only   . Wears hearing aid in both ears     Past Surgical History:  Procedure Laterality Date  .  ABDOMINAL HYSTERECTOMY  2004   complete  . ACHILLES TENDON REPAIR Right   . APPENDECTOMY  2004  . BREAST EXCISIONAL BIOPSY    . BREAST LUMPECTOMY Left    2015  . BREAST LUMPECTOMY WITH NEEDLE LOCALIZATION AND AXILLARY SENTINEL LYMPH NODE BX Left 02/23/2014   Procedure: BREAST LUMPECTOMY WITH NEEDLE LOCALIZATION AND AXILLARY SENTINEL LYMPH NODE BIOPSY;  Surgeon: Excell Seltzer, MD;  Location: [facility];  Service: General;  Laterality: Left;  . BREAST SURGERY  2011   left breast biposy  . CHOLECYSTECTOMY  1990  . COLONOSCOPY W/ POLYPECTOMY  06/2009  . EYE SURGERY Right 2013   exc. warts from underneath eyelid  . INCONTINENCE SURGERY  2004  . KNEE ARTHROSCOPY Bilateral    x 6 each knee  . LIGAMENT REPAIR Right    thumb/wrist  . ORIF TOE FRACTURE Right    great toe  . PORTACATH PLACEMENT N/A 03/24/2014   Procedure: INSERTION PORT-A-CATH;  Surgeon: Excell Seltzer, MD;  Location: [facility];  Service: General;  Laterality: N/A;; removed   . THYROIDECTOMY  2000  . TONSILLECTOMY AND ADENOIDECTOMY  2000  . TOTAL KNEE ARTHROPLASTY Left 12/03/2017   Procedure:  LEFT TOTAL KNEE ARTHROPLASTY;  Surgeon: Gaynelle Arabian, MD;  Location: [facility];  Service: Orthopedics;  Laterality: Left;  . TUMOR EXCISION     from thoracic spine    There were no vitals filed for this visit.  Subjective Assessment - 01/07/18 0757    Subjective  Reports she drove today for the first time. Reports L lateral knee pain which she has been having since surgery but worse today. Also did some swimming over the weekend.     Pertinent History  L UE and LE lymphedema- no vasopneumatic or BP on L side, latex allergy, hx of breast, uterine, thyroid CA with chemo and radiation therapies, R achilles tendon disorder, anemia, asthma, chest pain, COPD, esophageal spasm, fibromyalga, gallbladder disease, hx of seizures, HLD, hypothyroidism, liver disorder, migraines, palpitations, prediabetes, RA, SOB, tinnitus, vertigo    Patient Stated Goals  getting total mobility in L knee and not having as much scar tissue    Currently in Pain?  Yes    Pain Score  3     Pain Location  Knee    Pain Orientation  Left;Lateral    Pain Descriptors / Indicators  Aching;Dull    Pain Type  Acute pain;Surgical pain         [facility] PT Assessment - 01/07/18 0001      AROM   Right Knee Extension  9    Right Knee Flexion  101      PROM   Right Knee Extension  5    Right Knee Flexion  105                   [facility] Adult PT Treatment/Exercise - 01/07/18 0001      Knee/Hip Exercises: Stretches   Passive Hamstring Stretch  Left;2 reps;30 seconds;Limitations    Passive Hamstring Stretch Limitations  strap supine      Knee/Hip Exercises: Aerobic   Nustep  L3 x 6 min      Knee/Hip Exercises: Machines for Strengthening   Cybex Knee Extension  B LEs 2x10 10#    Cybex Knee Flexion  B LEs 15x20#       Knee/Hip Exercises: Standing   Lateral Step Up  1 set;Left;Step Height: 4";Limitations;15 reps;Hand Hold: 1;Step Height: 6"   cues to  avoid hip drop   Lateral Step Up Limitations  first 10 reps on  4"; last 5 reps on 6"    Wall Squat  1 set;10 reps;Limitations   cues for foot placement   Wall Squat Limitations  to tolerance    Other Standing Knee Exercises  sidestepping with red TB around ankles 2x97f      Knee/Hip Exercises: Supine   Quad Sets  Strengthening;Left;1 set;Limitations;10 reps    Quad Sets Limitations  10x10" heel propped on bolster    Heel Slides  AAROM;Left;1 set;10 reps;Limitations    Heel Slides Limitations  10x5"; L LE on peanut pball with strap; to tolerance      Manual Therapy   Manual Therapy  Joint mobilization    Joint Mobilization  grade II L knee extension mobs to tolerance               PT Short Term Goals - 01/04/18 07989     PT SHORT TERM GOAL #1   Title  Patient to be independent with initial HEP.    Time  2    Period  Weeks    Status  Achieved        PT Long Term Goals - 01/07/18 0805      PT LONG TERM GOAL #1   Title  Patient to be independent with advanced HEP.    Time  4    Period  Weeks    Status  Partially Met   met for current   Target Date  01/28/18      PT LONG TERM GOAL #2   Title  Patient to demonstrate L knee AROM/PROM 0-120 degrees.    Time  4    Period  Weeks    Status  On-going   demonstrating L knee AROM 9-101, PROM 5-105 degrees   Target Date  01/28/18      PT LONG TERM GOAL #3   Title  Patient to demonstrate B LE strength >=4+/5.    Time  4    Period  Weeks    Status  Partially Met   demonstrated improvements in L hip flexion, abduction, adduction, L knee flexion, extension   Target Date  01/28/18      PT LONG TERM GOAL #4   Title  Patient to demonstrate equal step length, weight shift, and upright trunk with ambulation with LRAD.    Time  4    Period  Weeks    Status  On-going   still demonstrating L trendelenberg and L trunk lean with gait training without AD; improved heel strike, step length, and weight shift   Target Date  01/28/18      PT LONG TERM GOAL #5   Title  Patient to report  tolerance of 1 hour of walking without pain limiting.     Time  4    Period  Weeks    Status  On-going   20 min limited by fatigue   Target Date  01/28/18      PT LONG TERM GOAL #6   Title  Patient to demonstrate L LE SLR without quad lag.     Time  4    Period  Weeks    Status  On-going   very mild quad lag evident   Target Date  01/28/18            Plan - 01/07/18 0953    Clinical Impression Statement  Patient arrived to session with report  of increased L lateral knee pain. L lateral knee with slightly increased edema and L lateral lower light slightly TTP. Patient with report that it feels like a "bruise." Advised patient to monitor symptoms- patient aware of signs of DVT. Patient tolerated gentle extension joint mobilizations to L knee. Measurements taken of L knee- AROM 9-101 degrees, PROM 5-105 degrees. Remaining goals updated last session. Patient intermittently requesting to sit up d/t dizziness after laying supine. Reporting increased dizziness when supine rather than standing. Introduced sidestepping with banded resistance to increase hip strengthening- patient with good tolerance however noting muscle burn. Ended session without report of increased pain. Patient progressing well however would benefit from skilled PT services 2x/week for 3 weeks to address remaining goals.     Clinical Impairments Affecting Rehab Potential  L UE and LE lymphedema- no vasopneumatic or BP on L side, latex allergy, hx of breast, uterine, thyroid CA with chemo and radiation therapies, R achilles tendon disorder, anemia, asthma, chest pain, COPD, esophageal spasm, fibromyalga, gallbladder disease, hx of seizures, HLD, hypothyroidism, liver disorder, migraines, palpitations, prediabetes, RA, SOB, tinnitus, vertigo    PT Treatment/Interventions  ADLs/Self Care Home Management;Cryotherapy;Moist Heat;DME Instruction;Gait training;Stair training;Functional mobility training;Therapeutic activities;Therapeutic  exercise;Manual techniques;Patient/family education;Orthotic Fit/Training;Neuromuscular re-education;Balance training;Scar mobilization;Passive range of motion;Dry needling;Energy conservation;Splinting;Taping    Consulted and Agree with Plan of Care  Patient       Patient will benefit from skilled therapeutic intervention in order to improve the following deficits and impairments:  Decreased endurance, Decreased scar mobility, Decreased activity tolerance, Decreased strength, Pain, Decreased balance, Decreased mobility, Difficulty walking, Decreased range of motion, Impaired flexibility, Postural dysfunction  Visit Diagnosis: Acute pain of left knee  Stiffness of left knee, not elsewhere classified  Muscle weakness (generalized)  Difficulty in walking, not elsewhere classified     Problem List Patient Active Problem List   Diagnosis Date Noted  . OA (osteoarthritis) of knee 12/03/2017  . Class 3 obesity without serious comorbidity with body mass index (BMI) of 40.0 to 44.9 in adult 01/01/2017  . Essential hypertension 10/18/2016  . Constipation 10/04/2016  . Vitamin D deficiency 10/04/2016  . Prediabetes 10/04/2016  . Morbid obesity ([place]) 09/20/2016  . Other fatigue 09/20/2016  . OSA (obstructive sleep apnea) 02/16/2016  . Right ankle pain 01/20/2016  . Strain of right Achilles tendon 11/30/2015  . Benign hypertension 10/11/2015  . Malignant neoplasm of thyroid gland ([place]) 10/11/2015  . De Quervain's tenosynovitis, bilateral 09/15/2015  . Right shoulder pain 09/15/2015  . Trigger finger, acquired 06/22/2015  . Left wrist pain 06/22/2015  . Thumb injury 06/14/2015  . Chemotherapy-induced peripheral neuropathy ([place]) 05/10/2015  . Postmenopausal osteoporosis 12/08/2014  . Preventative health care 12/01/2014  . DVT (deep venous thrombosis) ([place]) 08/28/2014  . Lymphedema of arm 08/28/2014  . Mucositis due to chemotherapy 06/02/2014  . UTI symptoms 02/17/2014  . Menopausal  syndrome (hot flashes) 12/31/2013  . Breast cancer of upper-outer quadrant of left female breast ([place]) 12/26/2013  . 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 Gingivitis 12/05/2013  . Seasonal allergies 09/24/2013  . Skin lesion 06/03/2013  . Menopause 06/02/2013  . Elevated blood-pressure reading without diagnosis of hypertension 03/05/2013  . Cough 07/24/2012  . Left knee injury 11/02/2011  . Gout 09/19/2011  . Hyperlipidemia 06/02/2011  . Migraine 06/02/2011  . Tinnitus of both ears 02/14/2011  . Left knee pain 09/23/2010  . FIBROMYALGIA 05/10/2010  . Asthma 03/22/2010  . 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 OVERWEIGHT 12/20/2009  .  GERD 12/20/2009  . Depression 12/01/2009     Janene Harvey, PT, DPT 01/07/18 9:56 AM   [facility] [address]  [place], [place], [address] Phone: [phone]   Fax:  [phone]  Name: Christan Ciccarelli MRN: [id number] Date of Birth: 06-27-1955

## 2018-01-08 DIAGNOSIS — M1712 Unilateral primary osteoarthritis, left knee: Secondary | ICD-10-CM | POA: Diagnosis not present

## 2018-01-09 ENCOUNTER — Ambulatory Visit: Payer: 59 | Admitting: Physical Therapy

## 2018-01-09 ENCOUNTER — Encounter: Payer: Self-pay | Admitting: Physical Therapy

## 2018-01-09 DIAGNOSIS — M25562 Pain in left knee: Secondary | ICD-10-CM

## 2018-01-09 DIAGNOSIS — M25662 Stiffness of left knee, not elsewhere classified: Secondary | ICD-10-CM

## 2018-01-09 DIAGNOSIS — L570 Actinic keratosis: Secondary | ICD-10-CM | POA: Diagnosis not present

## 2018-01-09 DIAGNOSIS — D485 Neoplasm of uncertain behavior of skin: Secondary | ICD-10-CM | POA: Diagnosis not present

## 2018-01-09 DIAGNOSIS — L814 Other melanin hyperpigmentation: Secondary | ICD-10-CM | POA: Diagnosis not present

## 2018-01-09 DIAGNOSIS — D225 Melanocytic nevi of trunk: Secondary | ICD-10-CM | POA: Diagnosis not present

## 2018-01-09 DIAGNOSIS — M6281 Muscle weakness (generalized): Secondary | ICD-10-CM

## 2018-01-09 DIAGNOSIS — L821 Other seborrheic keratosis: Secondary | ICD-10-CM | POA: Diagnosis not present

## 2018-01-09 DIAGNOSIS — R262 Difficulty in walking, not elsewhere classified: Secondary | ICD-10-CM

## 2018-01-09 NOTE — Therapy (Addendum)
[facility] [address]  [place], [place], [address] Phone: [phone]   Fax:  [phone]  Physical Therapy Treatment  Patient Details  Name: Michelle Kennedy MRN: [id number] Date of Birth: 1955/08/24 Referring Provider: Gaynelle Arabian, MD   Encounter Date: 01/09/2018  PT End of Session - 01/09/18 1202    Visit Number  12    Number of Visits  17    Date for PT Re-Evaluation  01/28/18    Authorization Type  [employer]    PT Start Time  8099    PT Stop Time  0844    PT Time Calculation (min)  47 min    Equipment Utilized During Treatment  Gait belt    Activity Tolerance  Patient tolerated treatment well;Patient limited by fatigue    Behavior During Therapy  [facility] for tasks assessed/performed       Past Medical History:  Diagnosis Date  . Abnormally small mouth   . Achilles tendon disorder, right   . Anemia   . Anxiety   . Arthritis    hips and knees  . Asthma    daily inhaler, prn inhaler and neb.  . Asthma due to environmental allergies   . Breast cancer ([place]) 01/2014   left  . Cancer ([place])   . Chest pain   . COPD (chronic obstructive pulmonary disease) ([place])   . Dental bridge present    upper front and lower right  . Dental crowns present    x 3  . Depression   . Esophageal spasm    reports since her chemo and breast surgery  she developed esophageal spasms and reports this is in the past has casue her 02 to desat in the  70s , denies sycnope in relation to this , does report hx of vertigo as well   . Family history of anesthesia complication    twin brother aspirated and died on OR table, per pt.  . Fibromyalgia   . Gallbladder disease   . GERD (gastroesophageal reflux disease)   . H/O blood clots    had blood to  PICC line   . History of gastric ulcer    as a teenager  . History of seizure age 48   as a reaction to Penicillin - no seizures since  . History of stomach ulcers   . History of thyroid  cancer    s/p thyroidectomy  . Hyperlipidemia   . Hypothyroidism   . Joint pain   . Left knee injury   . Liver disorder    seen when she had her hysterectomy , reports she was told it was  " small lesion" ; but asymptomatic   . Lymphedema    left arm  . Migraines   . Multiple food allergies   . Obesity   . OSA (obstructive sleep apnea) 02/16/2016  . Palpitations    reports no longer experiences  . Personal history of chemotherapy 2015  . Personal history of radiation therapy 2015   Left  . Prediabetes   . Rheumatoid arthritis ([place])   . SOB (shortness of breath)   . Swelling of both ankles   . Tinnitus   . UTI (lower urinary tract infection) 02/17/2014  . Vertigo    last episode  was 2 weeks ago   . Vitamin D deficiency   . Wears contact lenses    left eye only   . Wears hearing aid in both  ears     Past Surgical History:  Procedure Laterality Date  . ABDOMINAL HYSTERECTOMY  2004   complete  . ACHILLES TENDON REPAIR Right   . APPENDECTOMY  2004  . BREAST EXCISIONAL BIOPSY    . BREAST LUMPECTOMY Left    2015  . BREAST LUMPECTOMY WITH NEEDLE LOCALIZATION AND AXILLARY SENTINEL LYMPH NODE BX Left 02/23/2014   Procedure: BREAST LUMPECTOMY WITH NEEDLE LOCALIZATION AND AXILLARY SENTINEL LYMPH NODE BIOPSY;  Surgeon: Excell Seltzer, MD;  Location: [facility];  Service: General;  Laterality: Left;  . BREAST SURGERY  2011   left breast biposy  . CHOLECYSTECTOMY  1990  . COLONOSCOPY W/ POLYPECTOMY  06/2009  . EYE SURGERY Right 2013   exc. warts from underneath eyelid  . INCONTINENCE SURGERY  2004  . KNEE ARTHROSCOPY Bilateral    x 6 each knee  . LIGAMENT REPAIR Right    thumb/wrist  . ORIF TOE FRACTURE Right    great toe  . PORTACATH PLACEMENT N/A 03/24/2014   Procedure: INSERTION PORT-A-CATH;  Surgeon: Excell Seltzer, MD;  Location: [facility];  Service: General;  Laterality: N/A;; removed   . THYROIDECTOMY  2000  . TONSILLECTOMY AND  ADENOIDECTOMY  2000  . TOTAL KNEE ARTHROPLASTY Left 12/03/2017   Procedure: LEFT TOTAL KNEE ARTHROPLASTY;  Surgeon: Gaynelle Arabian, MD;  Location: [facility];  Service: Orthopedics;  Laterality: Left;  . TUMOR EXCISION     from thoracic spine    There were no vitals filed for this visit.  Subjective Assessment - 01/09/18 0759    Subjective  Patient reports she saw MD who was very pleased with her progress and does not need any more PT. Patient would like to be D/C'd on friday.     Pertinent History  L UE and LE lymphedema- no vasopneumatic or BP on L side, latex allergy, hx of breast, uterine, thyroid CA with chemo and radiation therapies, R achilles tendon disorder, anemia, asthma, chest pain, COPD, esophageal spasm, fibromyalga, gallbladder disease, hx of seizures, HLD, hypothyroidism, liver disorder, migraines, palpitations, prediabetes, RA, SOB, tinnitus, vertigo    Patient Stated Goals  getting total mobility in L knee and not having as much scar tissue    Currently in Pain?  Yes    Pain Score  2     Pain Location  Knee    Pain Orientation  Left;Lateral    Pain Descriptors / Indicators  Aching;Dull    Pain Type  Acute pain;Surgical pain                       [facility] Adult PT Treatment/Exercise - 01/09/18 0001      Exercises   Exercises  Knee/Hip      Knee/Hip Exercises: Stretches   Passive Hamstring Stretch  Left;2 reps;30 seconds;Limitations    Passive Hamstring Stretch Limitations  strap supine    Gastroc Stretch  Left;2 reps;30 seconds    Gastroc Stretch Limitations  prostretch at counter       Knee/Hip Exercises: Aerobic   Nustep  L3 x 6 min      Knee/Hip Exercises: Standing   Terminal Knee Extension  Left;Theraband;15 reps    Theraband Level (Terminal Knee Extension)  Level 4 (Blue)    Terminal Knee Extension Limitations  chair for support; 15x3" hold    Forward Step Up  Left;10 reps;Step Height: 6";Limitations;1 set;Hand Hold: 0    Forward Step Up  Limitations  step up + step  down; cues for slow eccentric lower; able to tolerate 8 reps before fatigue    Wall Squat  1 set;10 reps;Limitations    Wall Squat Limitations  cues to maintain back against wall    Other Standing Knee Exercises  L knee flexion stretch on step at counter top 5x10"      Knee/Hip Exercises: Supine   Straight Leg Raises  Left;Strengthening;10 reps;Limitations    Straight Leg Raises Limitations  cues for quad set before each rep      Manual Therapy   Manual Therapy  Joint mobilization    Joint Mobilization  grade II L knee extension mobs and L patellar mobs sup/inf and M/L to tolerance             PT Education - 01/09/18 1202    Education Details  consolidated HEP handout & TB administered    Person(s) Educated  Patient    Methods  Explanation;Demonstration;Tactile cues;Verbal cues;Handout    Comprehension  Returned demonstration;Verbalized understanding       PT Short Term Goals - 01/04/18 0505      PT SHORT TERM GOAL #1   Title  Patient to be independent with initial HEP.    Time  2    Period  Weeks    Status  Achieved        PT Long Term Goals - 01/07/18 0805      PT LONG TERM GOAL #1   Title  Patient to be independent with advanced HEP.    Time  4    Period  Weeks    Status  Partially Met   met for current   Target Date  01/28/18      PT LONG TERM GOAL #2   Title  Patient to demonstrate L knee AROM/PROM 0-120 degrees.    Time  4    Period  Weeks    Status  On-going   demonstrating L knee AROM 9-101, PROM 5-105 degrees   Target Date  01/28/18      PT LONG TERM GOAL #3   Title  Patient to demonstrate B LE strength >=4+/5.    Time  4    Period  Weeks    Status  Partially Met   demonstrated improvements in L hip flexion, abduction, adduction, L knee flexion, extension   Target Date  01/28/18      PT LONG TERM GOAL #4   Title  Patient to demonstrate equal step length, weight shift, and upright trunk with ambulation with LRAD.     Time  4    Period  Weeks    Status  On-going   still demonstrating L trendelenberg and L trunk lean with gait training without AD; improved heel strike, step length, and weight shift   Target Date  01/28/18      PT LONG TERM GOAL #5   Title  Patient to report tolerance of 1 hour of walking without pain limiting.     Time  4    Period  Weeks    Status  On-going   20 min limited by fatigue   Target Date  01/28/18      PT LONG TERM GOAL #6   Title  Patient to demonstrate L LE SLR without quad lag.     Time  4    Period  Weeks    Status  On-going   very mild quad lag evident   Target Date  01/28/18  Plan - 01/09/18 1203    Clinical Impression Statement  Patient arrived to session with report that she saw MD who was pleased by her progress and advised to wrap up with PT. Patient reports she is still limited in prolonged standing, limited by fatigue. Patient tolerated L knee patellar mobilizations and gentle extension mobilizations with no complaints. Able to perform anterior step up and step downs without UE support and cues required to slow eccentric lowering. Patient limited by fatigue and required sitting rest break. Reviewed consolidated HEP handout with patient- patient reported understanding. Patient to be placed on 30 day hold at next session per patient and MD's request.     Clinical Impairments Affecting Rehab Potential  L UE and LE lymphedema- no vasopneumatic or BP on L side, latex allergy, hx of breast, uterine, thyroid CA with chemo and radiation therapies, R achilles tendon disorder, anemia, asthma, chest pain, COPD, esophageal spasm, fibromyalga, gallbladder disease, hx of seizures, HLD, hypothyroidism, liver disorder, migraines, palpitations, prediabetes, RA, SOB, tinnitus, vertigo    PT Treatment/Interventions  ADLs/Self Care Home Management;Cryotherapy;Moist Heat;DME Instruction;Gait training;Stair training;Functional mobility training;Therapeutic  activities;Therapeutic exercise;Manual techniques;Patient/family education;Orthotic Fit/Training;Neuromuscular re-education;Balance training;Scar mobilization;Passive range of motion;Dry needling;Energy conservation;Splinting;Taping    PT Next Visit Plan  30 day hold next session    Consulted and Agree with Plan of Care  Patient       Patient will benefit from skilled therapeutic intervention in order to improve the following deficits and impairments:  Decreased endurance, Decreased scar mobility, Decreased activity tolerance, Decreased strength, Pain, Decreased balance, Decreased mobility, Difficulty walking, Decreased range of motion, Impaired flexibility, Postural dysfunction  Visit Diagnosis: Acute pain of left knee  Muscle weakness (generalized)  Stiffness of left knee, not elsewhere classified  Difficulty in walking, not elsewhere classified     Problem List Patient Active Problem List   Diagnosis Date Noted  . OA (osteoarthritis) of knee 12/03/2017  . Class 3 obesity without serious comorbidity with body mass index (BMI) of 40.0 to 44.9 in adult 01/01/2017  . Essential hypertension 10/18/2016  . Constipation 10/04/2016  . Vitamin D deficiency 10/04/2016  . Prediabetes 10/04/2016  . Morbid obesity ([place]) 09/20/2016  . Other fatigue 09/20/2016  . OSA (obstructive sleep apnea) 02/16/2016  . Right ankle pain 01/20/2016  . Strain of right Achilles tendon 11/30/2015  . Benign hypertension 10/11/2015  . Malignant neoplasm of thyroid gland ([place]) 10/11/2015  . De Quervain's tenosynovitis, bilateral 09/15/2015  . Right shoulder pain 09/15/2015  . Trigger finger, acquired 06/22/2015  . Left wrist pain 06/22/2015  . Thumb injury 06/14/2015  . Chemotherapy-induced peripheral neuropathy ([place]) 05/10/2015  . Postmenopausal osteoporosis 12/08/2014  . Preventative health care 12/01/2014  . DVT (deep venous thrombosis) ([place]) 08/28/2014  . Lymphedema of arm 08/28/2014  . Mucositis due  to chemotherapy 06/02/2014  . UTI symptoms 02/17/2014  . Menopausal syndrome (hot flashes) 12/31/2013  . Breast cancer of upper-outer quadrant of left female breast ([place]) 12/26/2013  . 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 Gingivitis 12/05/2013  . Seasonal allergies 09/24/2013  . Skin lesion 06/03/2013  . Menopause 06/02/2013  . Elevated blood-pressure reading without diagnosis of hypertension 03/05/2013  . Cough 07/24/2012  . Left knee injury 11/02/2011  . Gout 09/19/2011  . Hyperlipidemia 06/02/2011  . Migraine 06/02/2011  . Tinnitus of both ears 02/14/2011  . Left knee pain 09/23/2010  . FIBROMYALGIA 05/10/2010  . Asthma 03/22/2010  . 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 OVERWEIGHT 12/20/2009  .  GERD 12/20/2009  . Depression 12/01/2009    Janene Harvey, PT, DPT 01/09/18 12:06 PM   [facility] [address]  [place], [place], [address] Phone: [phone]   Fax:  [phone]  Name: Michelle Kennedy MRN: [id number] Date of Birth: 1955/06/25  PHYSICAL THERAPY DISCHARGE SUMMARY  Visits from Start of Care: 12  Current functional level related to goals / functional outcomes: Unable to assess; patient did not return after last visit   Remaining deficits: Unable to assess   Education / Equipment: HEP  Plan: Patient agrees to discharge.  Patient goals were partially met. Patient is being discharged due to not returning since the last visit.  ?????     Janene Harvey, PT, DPT 02/18/18 12:32 PM

## 2018-01-11 ENCOUNTER — Ambulatory Visit: Payer: 59

## 2018-01-22 MED FILL — AMLODIPINE BESYLATE 5 MG TA: 5 | 30 days supply | Qty: 30 | Fill #1

## 2018-01-23 ENCOUNTER — Ambulatory Visit: Payer: 59 | Admitting: Hematology and Oncology

## 2018-01-23 ENCOUNTER — Inpatient Hospital Stay: Payer: 59 | Attending: Hematology and Oncology | Admitting: Hematology and Oncology

## 2018-01-23 ENCOUNTER — Encounter: Payer: Self-pay | Admitting: Medical Oncology

## 2018-01-23 DIAGNOSIS — C50412 Malignant neoplasm of upper-outer quadrant of left female breast: Secondary | ICD-10-CM | POA: Diagnosis not present

## 2018-01-23 DIAGNOSIS — Z923 Personal history of irradiation: Secondary | ICD-10-CM

## 2018-01-23 DIAGNOSIS — Z9221 Personal history of antineoplastic chemotherapy: Secondary | ICD-10-CM

## 2018-01-23 DIAGNOSIS — Z79811 Long term (current) use of aromatase inhibitors: Secondary | ICD-10-CM | POA: Diagnosis not present

## 2018-01-23 DIAGNOSIS — Z006 Encounter for examination for normal comparison and control in clinical research program: Secondary | ICD-10-CM | POA: Diagnosis not present

## 2018-01-23 DIAGNOSIS — Z170 Estrogen receptor positive status [ER+]: Secondary | ICD-10-CM

## 2018-01-23 MED ORDER — ANASTROZOLE 1 MG PO TABS: 1.0000 mg | ORAL_TABLET | Freq: Every day | ORAL | 3 refills | Status: DC

## 2018-01-23 NOTE — Progress Notes (Signed)
Michelle Kennedy: Follow-up Phase I, 75-monthvisit.  Patient in clinic for her 36 month visit on study. I met with patient, here alone, during her vitals assessment. Patient reports to have had left knee replacement surgery, now approximately 7 weeks out. Patient confirms to be doing well and has been exercising in swimming pool to strengthen her knee. Patient confirms to be taking her endocrine therapy, anastrozole, daily as prescribed. Patient reports to be tolerating anastrozole without any significant complaints. Patient is not taking any new cancer treatment medication (per study information data collection request of any new cancer medications to be reported). Patient denies having any questions, issues or concerns. Patient with no AE's related to prior palbociclib. Patient was informed next 6 month study follow-up will be a phone call by me, unless MD or patient feels she needs to come in clinic for assessment. Patient thanked for her time and continued support of study and was encouraged to call Dr. GLindi Adieor myself with any questions or concerns she may have.  Breast exam completed by MD today (see MD notes). Patient had yearly mammogram in 06/2017, already reported. Patient completed study required questionnaires today.  LAdele Dan RN, BSN Clinical Research 01/23/2018 4:05 PM

## 2018-01-23 NOTE — Assessment & Plan Note (Signed)
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 M0 stage IIB Adjuvant chemotherapy Completed 4 cycles of dose dense Adriamycin and Cytoxan. Followed by Abraxane X 12 completed 08/18/14, status post radiation completed June 2016, started anastrozole 1 mg daily 11/20/2014 (also enrolled on [name] clinical trial randomized to [place]) completed 2 years9/08/2016  Lymphedema left arm: Improved with physical therapy for lymphedema History of thyroid cancer:Low likelihood of recurrence  Breast Cancer Surveillance: 1. Breast exam: 01/23/2018 Normal except for scar tissue related to prior surgery and radiation 2. Mammogram 2/11/2019cegory B breast density. No mammographic abnormalities postsurgical changes.  RTCone year for labs and follow-up

## 2018-01-23 NOTE — Progress Notes (Signed)
Patient Care Team: Debbrah Alar, NP as PCP - General (Internal Medicine) Excell Seltzer, MD as Consulting Physician (General Surgery) Nicholas Lose, MD as Consulting Physician (Hematology and Oncology) [name], MD as Attending Physician (Radiation Oncology) Katheran James., MD (Endocrinology) Maxwell Marion, RN as Registered Nurse (Medical Oncology)  DIAGNOSIS:  Encounter Diagnosis  Name Primary?  . Malignant neoplasm of upper-outer quadrant of left breast in female, estrogen receptor positive ([place])     SUMMARY OF ONCOLOGIC HISTORY:   Breast cancer of upper-outer quadrant of left female breast ([place])   12/22/2013 Mammogram    Left breast upper-outer quadrant 1.8 x 1.4 x 1.6 cm mass with left axillary lymph node measuring 3.6 mm    12/30/2013 Breast MRI    Dominant enhancing left upper outer quadrant irregular mass encompassing a confluent area of abnormal clumped nodular enhancement, 5.5 cm overall, 6 mm masslike enhancement central left breast      02/23/2014 Surgery    Left lumpectomy: Invasive ductal carcinoma grade 2 spending 3.8 cm intermediate grade DCIS with lymphovascular invasion margins negative one out of 4 SLN positive, ER 99%, PR 100%, HER-2 negative ratio 0.74, T2, N1, M0 stage IIB    03/19/2014 PET scan    No evidence of distant metastatic disease    03/31/2014 - 08/18/2014 Chemotherapy    Adjuvant chemotherapy with dose dense Adriamycin Cytoxan x4 followed by Abraxane weekly x12    08/28/2014 Imaging    Left brachial vein thrombus    09/11/2014 -  Radiation Therapy    Adjuvant radiation therapy    11/20/2014 -  Anti-estrogen oral therapy    Anastrozole 1 mg daily + Ibrance ([name] clinical trial)     CHIEF COMPLIANT: Follow-up on anastrozole therapy  INTERVAL HISTORY: Michelle Kennedy is a 62 year old with above-mentioned history of left breast cancer who underwent lumpectomy followed by adjuvant chemotherapy followed by radiation and  antiestrogen therapy.  She participated in [place] clinical trial and took 2 years of Ibrance along with anastrozole.  She is currently only on anastrozole and appears to be tolerating extremely well.  She does not have any complaints of fatigue or hot flashes or myalgias.  She had a recent left knee replacement surgery and is 7 weeks from it and appears to be handling it extremely well.  She is able to exercise in the swimming pool already.  REVIEW OF SYSTEMS:   Constitutional: Denies fevers, chills or abnormal weight loss Eyes: Denies blurriness of vision Ears, nose, mouth, throat, and face: Denies mucositis or sore throat Respiratory: Denies cough, dyspnea or wheezes Cardiovascular: Denies palpitation, chest discomfort Gastrointestinal:  Denies nausea, heartburn or change in bowel habits Skin: Denies abnormal skin rashes Lymphatics: Denies new lymphadenopathy or easy bruising Neurological:Denies numbness, tingling or new weaknesses Behavioral/Psych: Mood is stable, no new changes  Extremities: Left knee replacement Breast:  denies any pain or lumps or nodules in either breasts All other systems were reviewed with the patient and are negative.  I have reviewed the past medical history, past surgical history, social history and family history with the patient and they are unchanged from previous note.  ALLERGIES:  is allergic to bee venom; contrast media [iodinated diagnostic agents]; iodine; latex; penicillins; shellfish allergy; aspirin; erythromycin; lidocaine; symbicort [budesonide-formoterol fumarate]; betadine [povidone iodine]; codeine; compazine [prochlorperazine maleate]; pentazocine lactate; and sulfonamide derivatives.  MEDICATIONS:  Current Outpatient Medications  Medication Sig Dispense Refill  . albuterol (PROAIR HFA) 108 (90 Base) MCG/ACT inhaler INHALE 2 PUFFS INTO  THE LUNGS EVERY 6 HOURS AS NEEDED 3 Inhaler 1  . albuterol (PROVENTIL) (5 MG/ML) 0.5% nebulizer solution Take  2.5 mg by nebulization every 6 (six) hours as needed for wheezing or shortness of breath.    Marland Kitchen amLODipine (NORVASC) 5 MG tablet Take 1 tablet (5 mg total) by mouth daily. 30 tablet 3  . anastrozole (ARIMIDEX) 1 MG tablet TAKE 1 TABLET BY MOUTH  DAILY 90 tablet 3  . atorvastatin (LIPITOR) 20 MG tablet TAKE 1 TABLET BY MOUTH  DAILY 90 tablet 1  . ciprofloxacin (CIPRO) 500 MG tablet Take 1 tablet (500 mg total) by mouth 2 (two) times daily. 6 tablet 0  . diphenhydrAMINE (BENADRYL) 25 MG tablet Take 1 tablet an hour before MRI.  Then take 1 tablet at bedtime as needed, per your usual regimen. (Patient taking differently: Take 25 mg by mouth at bedtime as needed for sleep. Take 1 tablet an hour before MRI.  Then take 1 tablet at bedtime as n) 30 tablet 0  . DULoxetine (CYMBALTA) 60 MG capsule TAKE 1 CAPSULE BY MOUTH  DAILY 90 capsule 1  . EPINEPHrine 0.3 mg/0.3 mL IJ SOAJ injection Inject 0.3 mLs (0.3 mg total) into the muscle once. 2 Device 0  . Fluticasone-Salmeterol (ADVAIR DISKUS) 250-50 MCG/DOSE AEPB Inhale 1 puff into the lungs 2 (two) times daily. 180 each 1  . gabapentin (NEURONTIN) 300 MG capsule Take 1 capsule (300 mg total) by mouth 3 (three) times daily. Gabapentin 300 mg Protocol Take a 300 mg capsule three times a day for two weeks, Then a 300 mg capsule twice a day for two weeks, Then a 300 mg capsule once a day for two weeks, then discontinue the Gabapentin. 80 capsule 0  . levothyroxine (SYNTHROID, LEVOTHROID) 150 MCG tablet TAKE 1 TABLET BY MOUTH  DAILY 90 tablet 3  . loratadine-pseudoephedrine (CLARITIN-D 24-HOUR) 10-240 MG 24 hr tablet Take 1 tablet by mouth daily as needed for allergies.    . methocarbamol (ROBAXIN) 500 MG tablet Take 1 tablet (500 mg total) by mouth every 6 (six) hours as needed for muscle spasms. 40 tablet 0  . MYRBETRIQ 50 MG TB24 tablet TAKE 1 TABLET BY MOUTH  DAILY 90 tablet 1  . nitroGLYCERIN (NITROSTAT) 0.4 MG SL tablet Place 1 tablet (0.4 mg total) under the  tongue every 5 (five) minutes as needed for chest pain. 30 tablet 0  . ondansetron (ZOFRAN) 4 MG tablet Take 1 tablet (4 mg total) by mouth every 8 (eight) hours as needed. (Patient taking differently: Take 4 mg by mouth every 8 (eight) hours as needed for nausea or vomiting. ) 20 tablet 0  . oxyCODONE (OXY IR/ROXICODONE) 5 MG immediate release tablet Take 1-2 tablets (5-10 mg total) by mouth every 6 (six) hours as needed for moderate pain (pain score 4-6). 56 tablet 0  . rivaroxaban (XARELTO) 10 MG TABS tablet Take 1 tablet (10 mg total) by mouth daily with breakfast. 20 tablet 0  . topiramate (TOPAMAX) 50 MG tablet Take 1 tablet (50 mg total) by mouth daily. 30 tablet 0   No current facility-administered medications for this visit.     PHYSICAL EXAMINATION: ECOG PERFORMANCE STATUS: 1 - Symptomatic but completely ambulatory  Vitals:   01/23/18 1131  BP: (!) 159/87  Pulse: 79  Resp: 18  Temp: 97.7 F (36.5 C)  SpO2: 99%   Filed Weights   01/23/18 1131  Weight: 271 lb 6.4 oz (123.1 kg)    GENERAL:alert, no distress  and comfortable SKIN: skin color, texture, turgor are normal, no rashes or significant lesions EYES: normal, Conjunctiva are pink and non-injected, sclera clear OROPHARYNX:no exudate, no erythema and lips, buccal mucosa, and tongue normal  NECK: supple, thyroid normal size, non-tender, without nodularity LYMPH:  no palpable lymphadenopathy in the cervical, axillary or inguinal LUNGS: clear to auscultation and percussion with normal breathing effort HEART: regular rate & rhythm and no murmurs and no lower extremity edema ABDOMEN:abdomen soft, non-tender and normal bowel sounds MUSCULOSKELETAL:no cyanosis of digits and no clubbing  NEURO: alert & oriented x 3 with fluent speech, no focal motor/sensory deficits EXTREMITIES: Left knee surgery scar intact BREAST: No palpable masses or nodules in either right or left breasts. No palpable axillary supraclavicular or  infraclavicular adenopathy no breast tenderness or nipple discharge. (exam performed in the presence of a chaperone)  LABORATORY DATA:  I have reviewed the data as listed CMP Latest Ref Rng & Units 12/05/2017 12/04/2017 11/27/2017  Glucose 70 - 99 mg/dL 129(H) 121(H) 102(H)  BUN 8 - 23 mg/dL 13 8 12   Creatinine 0.44 - 1.00 mg/dL 0.67 0.75 0.85  Sodium 135 - 145 mmol/L 142 140 141  Potassium 3.5 - 5.1 mmol/L 4.3 3.9 4.3  Chloride 98 - 111 mmol/L 106 105 105  CO2 22 - 32 mmol/L 30 27 27   Calcium 8.9 - 10.3 mg/dL 9.2 8.7(L) 9.8  Total Protein 6.5 - 8.1 g/dL - - 7.1  Total Bilirubin 0.3 - 1.2 mg/dL - - 0.7  Alkaline Phos 38 - 126 U/L - - 63  AST 15 - 41 U/L - - 28  ALT 0 - 44 U/L - - 34    Lab Results  Component Value Date   WBC 14.3 (H) 12/05/2017   HGB 11.5 (L) 12/05/2017   HCT 36.6 12/05/2017   MCV 82.2 12/05/2017   PLT 266 12/05/2017   NEUTROABS 4.6 09/10/2017    ASSESSMENT & PLAN:  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 M0 stage IIB Adjuvant chemotherapy Completed 4 cycles of dose dense Adriamycin and Cytoxan. Followed by Abraxane X 12 completed 08/18/14, status post radiation completed June 2016, started anastrozole 1 mg daily 11/20/2014 (also enrolled on [name] clinical trial randomized to [facility]) completed 2 years9/08/2016  Breast Cancer Surveillance: 1. Breast exam: 01/23/2018 Normal except for scar tissue related to prior surgery and radiation 2. Mammogram 2/11/2019cegory B breast density. No mammographic abnormalities postsurgical changes.  RTCone year for labs and follow-up  No orders of the defined types were placed in this encounter.  The patient has a good understanding of the overall plan. she agrees with it. she will call with any problems that may develop before the next visit here.   Harriette Ohara, MD 01/23/18

## 2018-01-24 ENCOUNTER — Telehealth: Payer: Self-pay | Admitting: Hematology and Oncology

## 2018-01-24 NOTE — Telephone Encounter (Signed)
Mailed pt calendar  °

## 2018-02-18 MED FILL — AMLODIPINE BESYLATE 5 MG TA: 5 | 30 days supply | Qty: 30 | Fill #2

## 2018-02-26 ENCOUNTER — Ambulatory Visit (INDEPENDENT_AMBULATORY_CARE_PROVIDER_SITE_OTHER): Payer: 59 | Admitting: Family

## 2018-02-26 ENCOUNTER — Ambulatory Visit (HOSPITAL_BASED_OUTPATIENT_CLINIC_OR_DEPARTMENT_OTHER)
Admission: RE | Admit: 2018-02-26 | Discharge: 2018-02-26 | Disposition: A | Discharge status: Home / Self Care | Payer: 59 | Source: Ambulatory Visit | Attending: Family | Admitting: Family

## 2018-02-26 ENCOUNTER — Encounter: Payer: Self-pay | Admitting: Family

## 2018-02-26 VITALS — BP 126/80 | HR 82 | Temp 98.6°F | Resp 18 | Ht 67.0 in | Wt 271.0 lb

## 2018-02-26 DIAGNOSIS — R05 Cough: Secondary | ICD-10-CM | POA: Diagnosis present

## 2018-02-26 DIAGNOSIS — J45909 Unspecified asthma, uncomplicated: Secondary | ICD-10-CM

## 2018-02-26 DIAGNOSIS — R739 Hyperglycemia, unspecified: Secondary | ICD-10-CM

## 2018-02-26 DIAGNOSIS — I10 Essential (primary) hypertension: Secondary | ICD-10-CM

## 2018-02-26 DIAGNOSIS — R600 Localized edema: Secondary | ICD-10-CM

## 2018-02-26 DIAGNOSIS — Z23 Encounter for immunization: Secondary | ICD-10-CM

## 2018-02-26 DIAGNOSIS — F32A Depression, unspecified: Secondary | ICD-10-CM

## 2018-02-26 DIAGNOSIS — E785 Hyperlipidemia, unspecified: Secondary | ICD-10-CM | POA: Diagnosis not present

## 2018-02-26 DIAGNOSIS — R059 Cough, unspecified: Secondary | ICD-10-CM

## 2018-02-26 DIAGNOSIS — F329 Major depressive disorder, single episode, unspecified: Secondary | ICD-10-CM

## 2018-02-26 LAB — BASIC METABOLIC PANEL
BUN: 13 mg/dL (ref 6–23)
CHLORIDE: 104 meq/L (ref 96–112)
CO2: 30 mEq/L (ref 19–32)
Calcium: 10.2 mg/dL (ref 8.4–10.5)
Creatinine, Ser: 0.83 mg/dL (ref 0.40–1.20)
GFR: 73.63 mL/min (ref >60.00)
GLUCOSE: 93 mg/dL (ref 70–99)
POTASSIUM: 4.3 meq/L (ref 3.5–5.1)
Sodium: 141 mEq/L (ref 135–145)

## 2018-02-26 LAB — LIPID PANEL
CHOL/HDL RATIO: 3
Cholesterol: 174 mg/dL (ref 0–200)
HDL: 55.1 mg/dL (ref >39.00)
LDL Cholesterol: 89 mg/dL (ref 0–99)
NonHDL: 118.52
TRIGLYCERIDES: 148 mg/dL (ref 0.0–149.0)
VLDL: 29.6 mg/dL (ref 0.0–40.0)

## 2018-02-26 LAB — HEMOGLOBIN A1C: Hgb A1c MFr Bld: 5.9 % (ref 4.6–6.5)

## 2018-02-26 NOTE — Progress Notes (Signed)
Subjective:    Patient ID: Michelle Kennedy, female    DOB: 27-Dec-1955, 62 y.o.   MRN: [id number]  HPI  Michelle Kennedy is a 63 yr old female who presents today for follow up.  HTN-patient is maintained on amlodipine 5 mg BP Readings from Last 3 Encounters:  02/26/18 126/80  01/23/18 (!) 159/87  12/05/17 111/82   S/p L TKA- completed PT.  She is now doing water aerobics at the sports center.   Reports that she fatigues easily following her surgery.  Reports that she has had to use her rescue inhaler more in the middle of the night. Notes bilateral LE edema.  Denies chest tightness.    Lab Results  Component Value Date   CHOL 177 09/20/2016   HDL 70 09/20/2016   LDLCALC 78 09/20/2016   LDLDIRECT 102.0 07/08/2014   TRIG 143 09/20/2016   CHOLHDL 3 01/17/2016   Lab Results  Component Value Date   HGBA1C 5.7 (H) 01/23/2017   Depression- continues cymbalta.  Mood is OK    She reports that she continues Myrbetriq with good control control of her urinary symptoms. Review of Systems See HPI  Past Medical History:  Diagnosis Date  . Abnormally small mouth   . Achilles tendon disorder, right   . Anemia   . Anxiety   . Arthritis    hips and knees  . Asthma    daily inhaler, prn inhaler and neb.  . Asthma due to environmental allergies   . Breast cancer ([place]) 01/2014   left  . Cancer ([place])   . Chest pain   . COPD (chronic obstructive pulmonary disease) ([place])   . Dental bridge present    upper front and lower right  . Dental crowns present    x 3  . Depression   . Esophageal spasm    reports since her chemo and breast surgery  she developed esophageal spasms and reports this is in the past has casue her 02 to desat in the  70s , denies sycnope in relation to this , does report hx of vertigo as well   . Family history of anesthesia complication    twin brother aspirated and died on OR table, per pt.  . Fibromyalgia   . Gallbladder disease   . GERD (gastroesophageal reflux  disease)   . H/O blood clots    had blood to  PICC line   . History of gastric ulcer    as a teenager  . History of seizure age 26   as a reaction to Penicillin - no seizures since  . History of stomach ulcers   . History of thyroid cancer    s/p thyroidectomy  . Hyperlipidemia   . Hypothyroidism   . Joint pain   . Left knee injury   . Liver disorder    seen when she had her hysterectomy , reports she was told it was  " small lesion" ; but asymptomatic   . Lymphedema    left arm  . Migraines   . Multiple food allergies   . Obesity   . OSA (obstructive sleep apnea) 02/16/2016  . Palpitations    reports no longer experiences  . Personal history of chemotherapy 2015  . Personal history of radiation therapy 2015   Left  . Prediabetes   . Rheumatoid arthritis ([place])   . SOB (shortness of breath)   . Swelling of both ankles   . Tinnitus   . UTI (  lower urinary tract infection) 02/17/2014  . Vertigo    last episode  was 2 weeks ago   . Vitamin D deficiency   . Wears contact lenses    left eye only   . Wears hearing aid in both ears      Social History   Socioeconomic History  . Marital status: Married    Spouse name: Not on file  . Number of children: Not on file  . Years of education: Not on file  . Highest education level: Not on file  Occupational History  . 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 Financial resource strain: Not on file  . Food insecurity:    Worry: Not on file    Inability: Not on file  . Transportation needs:    Medical: Not on file    Non-medical: Not on file  Tobacco Use  . Smoking status: Never Smoker  . Smokeless tobacco: Never Used  Substance and Sexual Activity  . Alcohol use: No    Alcohol/week: 0.0 standard drinks  . Drug use: No  . Sexual activity: Not Currently    Partners: Male    Comment: menarche age 32, P 2, first birth age 59, menopause age 52, Premarin x 10 yrs  Lifestyle  . Physical activity:      Days per week: Not on file    Minutes per session: Not on file  . Stress: Not on file  Relationships  . Social connections:    Talks on phone: Not on file    Gets together: Not on file    Attends religious service: Not on file    Active member of club or organization: Not on file    Attends meetings of clubs or organizations: Not on file    Relationship status: Not on file  . Intimate partner violence:    Fear of current or ex partner: Not on file    Emotionally abused: Not on file    Physically abused: Not on file    Forced sexual activity: Not on file  Other Topics Concern  . Not on file  Social History Narrative   Regular exercise: yes       Past Surgical History:  Procedure Laterality Date  . ABDOMINAL HYSTERECTOMY  2004   complete  . ACHILLES TENDON REPAIR Right   . APPENDECTOMY  2004  . BREAST EXCISIONAL BIOPSY    . BREAST LUMPECTOMY Left    2015  . BREAST LUMPECTOMY WITH NEEDLE LOCALIZATION AND AXILLARY SENTINEL LYMPH NODE BX Left 02/23/2014   Procedure: BREAST LUMPECTOMY WITH NEEDLE LOCALIZATION AND AXILLARY SENTINEL LYMPH NODE BIOPSY;  Surgeon: Excell Seltzer, MD;  Location: [facility];  Service: General;  Laterality: Left;  . BREAST SURGERY  2011   left breast biposy  . CHOLECYSTECTOMY  1990  . COLONOSCOPY W/ POLYPECTOMY  06/2009  . EYE SURGERY Right 2013   exc. warts from underneath eyelid  . INCONTINENCE SURGERY  2004  . KNEE ARTHROSCOPY Bilateral    x 6 each knee  . LIGAMENT REPAIR Right    thumb/wrist  . ORIF TOE FRACTURE Right    great toe  . PORTACATH PLACEMENT N/A 03/24/2014   Procedure: INSERTION PORT-A-CATH;  Surgeon: Excell Seltzer, MD;  Location: [facility];  Service: General;  Laterality: N/A;; removed   . THYROIDECTOMY  2000  . TONSILLECTOMY AND ADENOIDECTOMY  2000  . TOTAL KNEE ARTHROPLASTY Left 12/03/2017   Procedure: LEFT  TOTAL KNEE ARTHROPLASTY;  Surgeon: Gaynelle Arabian, MD;  Location: [facility];  Service:  Orthopedics;  Laterality: Left;  . TUMOR EXCISION     from thoracic spine    Family History  Problem Relation Age of Onset  . Alcohol abuse Mother   . Arthritis Mother   . Hypertension Mother   . Bipolar disorder Mother   . Breast cancer Mother 88       unconfirmed  . Lung cancer Mother 52       smoker  . Hyperlipidemia Mother   . Stroke Mother   . Cancer Mother   . Depression Mother   . Anxiety disorder Mother   . Alcoholism Mother   . Drug abuse Mother   . Eating disorder Mother   . Obesity Mother   . Alcohol abuse Father   . Cancer Father        lung  . Hyperlipidemia Father   . Kidney disease Father   . Diabetes Father   . Hypertension Father   . Cystic kidney disease Father   . Thyroid disease Father   . Liver disease Father   . Alcoholism Father   . Arthritis Maternal Grandmother   . Diabetes Paternal Grandmother   . Thyroid cancer Sister 52       type?; currently 35  . Other Sister        ovarian tumor @ 70; TAH/BSO  . Breast cancer Sister   . Thyroid cancer Brother        dx 60s; currently 92  . Breast cancer Maternal Aunt        dx 45s; deceased 48  . Thyroid cancer Paternal Aunt        All 3 paternal aunts with thyroid ca in 30s/40s  . Lung cancer Paternal Aunt        2 of 3 paternal aunts with lung cancer    Allergies  Allergen Reactions  . Bee Venom Anaphylaxis  . Contrast Media [Iodinated Diagnostic Agents] Shortness Of Breath  . Iodine Other (See Comments)    CARDIAC ARREST  . Latex Anaphylaxis and Rash  . Penicillins Shortness Of Breath, Rash and Other (See Comments)    SEIZURE  . Shellfish Allergy Shortness Of Breath and Rash  . Aspirin Rash and Other (See Comments)    GI UPSET  . Erythromycin Swelling and Rash    SWELLING OF JOINTS  . Lidocaine Swelling    SWELLING OF MOUTH AND THROAT  . Symbicort [Budesonide-Formoterol Fumarate] Other (See Comments)    BURNING OF TONGUE AND LIPS  . Betadine [Povidone Iodine]     Rash. Breathing  problems.   . Codeine Rash  . Compazine [Prochlorperazine Maleate] Rash    Rash on face,chest, arms, back  . Pentazocine Lactate Rash  . Sulfonamide Derivatives Rash    Current Outpatient Medications on File Prior to Visit  Medication Sig Dispense Refill  . albuterol (PROAIR HFA) 108 (90 Base) MCG/ACT inhaler INHALE 2 PUFFS INTO THE LUNGS EVERY 6 HOURS AS NEEDED 3 Inhaler 1  . albuterol (PROVENTIL) (5 MG/ML) 0.5% nebulizer solution Take 2.5 mg by nebulization every 6 (six) hours as needed for wheezing or shortness of breath.    Marland Kitchen amLODipine (NORVASC) 5 MG tablet Take 1 tablet (5 mg total) by mouth daily. 30 tablet 3  . anastrozole (ARIMIDEX) 1 MG tablet Take 1 tablet (1 mg total) by mouth daily. 90 tablet 3  . atorvastatin (LIPITOR) 20 MG tablet TAKE 1 TABLET BY  MOUTH  DAILY 90 tablet 1  . ciprofloxacin (CIPRO) 500 MG tablet Take 1 tablet (500 mg total) by mouth 2 (two) times daily. 6 tablet 0  . diphenhydrAMINE (BENADRYL) 25 MG tablet Take 1 tablet an hour before MRI.  Then take 1 tablet at bedtime as needed, per your usual regimen. (Patient taking differently: Take 25 mg by mouth at bedtime as needed for sleep. Take 1 tablet an hour before MRI.  Then take 1 tablet at bedtime as n) 30 tablet 0  . DULoxetine (CYMBALTA) 60 MG capsule TAKE 1 CAPSULE BY MOUTH  DAILY 90 capsule 1  . EPINEPHrine 0.3 mg/0.3 mL IJ SOAJ injection Inject 0.3 mLs (0.3 mg total) into the muscle once. 2 Device 0  . Fluticasone-Salmeterol (ADVAIR DISKUS) 250-50 MCG/DOSE AEPB Inhale 1 puff into the lungs 2 (two) times daily. 180 each 1  . gabapentin (NEURONTIN) 300 MG capsule Take 1 capsule (300 mg total) by mouth 3 (three) times daily. Gabapentin 300 mg Protocol Take a 300 mg capsule three times a day for two weeks, Then a 300 mg capsule twice a day for two weeks, Then a 300 mg capsule once a day for two weeks, then discontinue the Gabapentin. 80 capsule 0  . levothyroxine (SYNTHROID, LEVOTHROID) 150 MCG tablet TAKE 1  TABLET BY MOUTH  DAILY 90 tablet 3  . loratadine-pseudoephedrine (CLARITIN-D 24-HOUR) 10-240 MG 24 hr tablet Take 1 tablet by mouth daily as needed for allergies.    . methocarbamol (ROBAXIN) 500 MG tablet Take 1 tablet (500 mg total) by mouth every 6 (six) hours as needed for muscle spasms. 40 tablet 0  . MYRBETRIQ 50 MG TB24 tablet TAKE 1 TABLET BY MOUTH  DAILY 90 tablet 1  . nitroGLYCERIN (NITROSTAT) 0.4 MG SL tablet Place 1 tablet (0.4 mg total) under the tongue every 5 (five) minutes as needed for chest pain. 30 tablet 0  . ondansetron (ZOFRAN) 4 MG tablet Take 1 tablet (4 mg total) by mouth every 8 (eight) hours as needed. (Patient taking differently: Take 4 mg by mouth every 8 (eight) hours as needed for nausea or vomiting. ) 20 tablet 0  . oxyCODONE (OXY IR/ROXICODONE) 5 MG immediate release tablet Take 1-2 tablets (5-10 mg total) by mouth every 6 (six) hours as needed for moderate pain (pain score 4-6). 56 tablet 0  . rivaroxaban (XARELTO) 10 MG TABS tablet Take 1 tablet (10 mg total) by mouth daily with breakfast. 20 tablet 0  . topiramate (TOPAMAX) 50 MG tablet Take 1 tablet (50 mg total) by mouth daily. 30 tablet 0   No current facility-administered medications on file prior to visit.     BP 126/80 (BP Location: Right Arm, Patient Position: Sitting, Cuff Size: Large)   Pulse 82   Temp 98.6 F (37 C) (Oral)   Resp 18   Ht 5\' 7"  (1.702 m)   Wt 271 lb (122.9 kg)   SpO2 98%   BMI 42.44 kg/m       Objective:   Physical Exam  Constitutional: She appears well-developed and well-nourished.  HENT:  Head: Normocephalic and atraumatic.  Right Ear: Tympanic membrane and ear canal normal.  Left Ear: Tympanic membrane and ear canal normal.  Mouth/Throat: No oropharyngeal exudate, posterior oropharyngeal edema or posterior oropharyngeal erythema.  Cardiovascular: Normal rate, regular rhythm and normal heart sounds.  No murmur heard. Pulmonary/Chest: Effort normal and breath sounds  normal. No respiratory distress. She has no wheezes.  Musculoskeletal:  2+ bilateral lower extremity  edema  Psychiatric: She has a normal mood and affect. Her behavior is normal. Judgment and thought content normal.          Assessment & Plan:  Hypertension-blood pressure stable on current medications.  Continue same.  Amlodipine is likely cause for her lower extremity edema.  Asthma-using her inhaler more often.  The chest x-ray was performed due to her cough/vague pulmonary complaints.  Chest x-ray is clear.  Depression- stable on Cymbalta continue same.  Hyperglycemia- obtain A1c.  Hyperlipidemia- obtain follow-up lipid panel.  Continue statin.  Flu shot today.

## 2018-02-26 NOTE — Patient Instructions (Signed)
Please complete lab work prior to leaving.  Complete chest x-ray on the first floor.  

## 2018-03-06 ENCOUNTER — Ambulatory Visit (HOSPITAL_BASED_OUTPATIENT_CLINIC_OR_DEPARTMENT_OTHER)
Admission: RE | Admit: 2018-03-06 | Discharge: 2018-03-06 | Disposition: A | Discharge status: Home / Self Care | Payer: 59 | Source: Ambulatory Visit | Attending: Family | Admitting: Family

## 2018-03-06 DIAGNOSIS — R600 Localized edema: Secondary | ICD-10-CM

## 2018-03-06 DIAGNOSIS — E119 Type 2 diabetes mellitus without complications: Secondary | ICD-10-CM | POA: Insufficient documentation

## 2018-03-06 DIAGNOSIS — I77810 Thoracic aortic ectasia: Secondary | ICD-10-CM | POA: Insufficient documentation

## 2018-03-06 DIAGNOSIS — E785 Hyperlipidemia, unspecified: Secondary | ICD-10-CM | POA: Diagnosis not present

## 2018-03-06 DIAGNOSIS — J449 Chronic obstructive pulmonary disease, unspecified: Secondary | ICD-10-CM | POA: Diagnosis not present

## 2018-03-06 DIAGNOSIS — I517 Cardiomegaly: Secondary | ICD-10-CM | POA: Diagnosis not present

## 2018-03-06 NOTE — Progress Notes (Signed)
  Echocardiogram 2D Echocardiogram has been performed.  Elma Shands T Harlie Buening 03/06/2018, 11:18 AM

## 2018-03-26 ENCOUNTER — Ambulatory Visit: Payer: 59 | Admitting: Family

## 2018-03-28 MED FILL — AMLODIPINE BESYLATE 5 MG TA: 5 | 30 days supply | Qty: 30 | Fill #3

## 2018-04-05 DIAGNOSIS — M1712 Unilateral primary osteoarthritis, left knee: Secondary | ICD-10-CM | POA: Diagnosis not present

## 2018-04-07 ENCOUNTER — Other Ambulatory Visit: Payer: Self-pay | Admitting: Family

## 2018-04-22 ENCOUNTER — Other Ambulatory Visit: Payer: Self-pay | Admitting: Family

## 2018-04-24 MED FILL — AMLODIPINE BESYLATE 5 MG TA: 5 | 30 days supply | Qty: 30 | Fill #0

## 2018-04-28 ENCOUNTER — Other Ambulatory Visit: Payer: Self-pay | Admitting: Family

## 2018-05-01 ENCOUNTER — Ambulatory Visit (HOSPITAL_BASED_OUTPATIENT_CLINIC_OR_DEPARTMENT_OTHER)
Admission: RE | Admit: 2018-05-01 | Discharge: 2018-05-01 | Disposition: A | Discharge status: Home / Self Care | Payer: 59 | Source: Ambulatory Visit | Attending: Family | Admitting: Family

## 2018-05-01 ENCOUNTER — Encounter: Payer: Self-pay | Admitting: Family

## 2018-05-01 ENCOUNTER — Ambulatory Visit (INDEPENDENT_AMBULATORY_CARE_PROVIDER_SITE_OTHER): Payer: 59 | Admitting: Family

## 2018-05-01 VITALS — BP 149/86 | HR 62 | Temp 98.2°F | Resp 16 | Ht 67.0 in | Wt 278.0 lb

## 2018-05-01 DIAGNOSIS — F329 Major depressive disorder, single episode, unspecified: Secondary | ICD-10-CM

## 2018-05-01 DIAGNOSIS — M79671 Pain in right foot: Secondary | ICD-10-CM | POA: Insufficient documentation

## 2018-05-01 DIAGNOSIS — J45909 Unspecified asthma, uncomplicated: Secondary | ICD-10-CM

## 2018-05-01 DIAGNOSIS — S92514A Nondisplaced fracture of proximal phalanx of right lesser toe(s), initial encounter for closed fracture: Secondary | ICD-10-CM | POA: Diagnosis not present

## 2018-05-01 DIAGNOSIS — E039 Hypothyroidism, unspecified: Secondary | ICD-10-CM

## 2018-05-01 DIAGNOSIS — F32A Depression, unspecified: Secondary | ICD-10-CM

## 2018-05-01 LAB — TSH: TSH: 14.6 u[IU]/mL — AB (ref 0.35–4.50)

## 2018-05-01 MED ORDER — FLUTICASONE-SALMETEROL 250-50 MCG/DOSE IN AEPB: 1.0000 | INHALATION_SPRAY | Freq: Two times a day (BID) | RESPIRATORY_TRACT | 3 refills | Status: DC

## 2018-05-01 NOTE — Patient Instructions (Signed)
Please complete lab work prior to leaving.  Advair will be changed to [facility]- hopefully this will be cheaper. Complete foot x-ray on the first floor.
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
## 2018-05-03 ENCOUNTER — Other Ambulatory Visit: Payer: Self-pay

## 2018-05-03 ENCOUNTER — Other Ambulatory Visit: Payer: Self-pay | Admitting: Family

## 2018-05-03 DIAGNOSIS — E039 Hypothyroidism, unspecified: Secondary | ICD-10-CM

## 2018-05-03 MED ORDER — LEVOTHYROXINE SODIUM 175 MCG PO TABS: 175.0000 ug | ORAL_TABLET | Freq: Every day | ORAL | 0 refills | Status: DC

## 2018-05-03 NOTE — Telephone Encounter (Signed)
Advised patient of medication increase, she wanted this by mail order. rx sent. She will call to scheduled 6 weeks follow up once she starts taking.  Advised of xray results.

## 2018-05-03 NOTE — Telephone Encounter (Signed)
Also, it looks like her synthroid needs to be increased. Please confirm that she is taking daily on empty stomach in AM. If so, she needs to increase synthroid from 150 to 175 mcg once daily and repeat TSH in 6 weeks.

## 2018-05-03 NOTE — Telephone Encounter (Signed)
Please contact pt and let her know that her x-ray does show that she fractured her little toe at the base but the fracture is healing. Nothing to do at this point except to let it continue to heal.

## 2018-05-03 NOTE — Telephone Encounter (Signed)
Called but no answer and unable to leave message, will try later.

## 2018-05-03 NOTE — Progress Notes (Signed)
synth 

## 2018-05-06 ENCOUNTER — Telehealth: Payer: Self-pay | Admitting: Hematology and Oncology

## 2018-05-06 ENCOUNTER — Encounter: Payer: Self-pay | Admitting: Internal Medicine

## 2018-05-06 NOTE — Telephone Encounter (Signed)
I informed the patient that the TSH is 14.6. She will talk to her endocrinologist about adjusting her thyroid medication.

## 2018-05-07 ENCOUNTER — Other Ambulatory Visit: Payer: Self-pay | Admitting: Internal Medicine

## 2018-05-07 DIAGNOSIS — E890 Postprocedural hypothyroidism: Secondary | ICD-10-CM

## 2018-05-16 ENCOUNTER — Encounter: Payer: Self-pay | Admitting: Family

## 2018-05-30 ENCOUNTER — Other Ambulatory Visit (INDEPENDENT_AMBULATORY_CARE_PROVIDER_SITE_OTHER): Payer: 59

## 2018-05-30 ENCOUNTER — Ambulatory Visit (INDEPENDENT_AMBULATORY_CARE_PROVIDER_SITE_OTHER): Payer: 59 | Admitting: Family Medicine

## 2018-05-30 DIAGNOSIS — R030 Elevated blood-pressure reading, without diagnosis of hypertension: Secondary | ICD-10-CM

## 2018-05-30 DIAGNOSIS — E039 Hypothyroidism, unspecified: Secondary | ICD-10-CM

## 2018-05-30 LAB — TSH: TSH: 6.4 u[IU]/mL — ABNORMAL HIGH (ref 0.35–4.50)

## 2018-05-30 MED ORDER — AMLODIPINE BESYLATE 10 MG PO TABS: 10.0000 mg | ORAL_TABLET | Freq: Every day | ORAL | 3 refills | Status: DC

## 2018-05-30 NOTE — Progress Notes (Signed)
Noted. Agree with above.  [place], DO 05/30/18 9:30 AM

## 2018-05-30 NOTE — Progress Notes (Signed)
`  Pre visit review using our clinic tool,if applicable. No additional management support is needed unless otherwise documented below in the visit note.   Pt here for Blood pressure check per order from M. Edwena Blow NP.   Pt currently takes:Amlodipine 5 mg daily.   Pt reports compliance with medication.  BP today  = 141/97 HR = 77   Pt advised per Dr.  Hart Carwin. [place] to increase Amlodipine to 10 mg daily and return for OV with provider in 1 week.

## 2018-05-31 ENCOUNTER — Telehealth: Payer: Self-pay | Admitting: Family

## 2018-05-31 ENCOUNTER — Other Ambulatory Visit: Payer: Self-pay

## 2018-05-31 DIAGNOSIS — E039 Hypothyroidism, unspecified: Secondary | ICD-10-CM

## 2018-05-31 MED ORDER — LEVOTHYROXINE SODIUM 200 MCG PO TABS: 200.0000 ug | ORAL_TABLET | Freq: Every day | ORAL | 3 refills | Status: DC

## 2018-05-31 NOTE — Telephone Encounter (Signed)
Advised patient of results, she will get new rx and call back for 6 weeks lab appointment to recheck TSH

## 2018-05-31 NOTE — Progress Notes (Signed)
ts

## 2018-05-31 NOTE — Telephone Encounter (Signed)
Please let pt know that TSH is improving but still shows we need to increase synthroid. rx sent for synthroid 200 mcg to her pharmacy. Repeat tsh in 6 weeks, dx hypothyroid.

## 2018-06-04 ENCOUNTER — Encounter: Payer: Self-pay | Admitting: Family

## 2018-06-07 ENCOUNTER — Ambulatory Visit (INDEPENDENT_AMBULATORY_CARE_PROVIDER_SITE_OTHER): Payer: 59 | Admitting: Family

## 2018-06-07 VITALS — BP 144/75 | HR 72 | Temp 98.7°F | Resp 16 | Ht 67.0 in | Wt 280.0 lb

## 2018-06-07 DIAGNOSIS — B351 Tinea unguium: Secondary | ICD-10-CM | POA: Diagnosis not present

## 2018-06-07 DIAGNOSIS — E039 Hypothyroidism, unspecified: Secondary | ICD-10-CM

## 2018-06-07 DIAGNOSIS — I10 Essential (primary) hypertension: Secondary | ICD-10-CM | POA: Diagnosis not present

## 2018-06-07 DIAGNOSIS — J45909 Unspecified asthma, uncomplicated: Secondary | ICD-10-CM | POA: Diagnosis not present

## 2018-06-07 DIAGNOSIS — N3281 Overactive bladder: Secondary | ICD-10-CM

## 2018-06-07 DIAGNOSIS — E785 Hyperlipidemia, unspecified: Secondary | ICD-10-CM

## 2018-06-07 MED ORDER — TERBINAFINE HCL 250 MG PO TABS: 250.0000 mg | ORAL_TABLET | Freq: Every day | ORAL | 0 refills | Status: DC

## 2018-06-07 NOTE — Patient Instructions (Signed)
Start Lamisil once daily.

## 2018-06-07 NOTE — Progress Notes (Signed)
Subjective:    Patient ID: Michelle Kennedy, female    DOB: 11/16/1955, 63 y.o.   MRN: [id number]  HPI  HTN- amlodipine up to 10 from 5mg . Increased 4 days ago. BP Readings from Last 3 Encounters:  06/07/18 (!) 144/75  05/01/18 (!) 149/86  02/26/18 126/80   Hypothyroid- feels better on the 29mcg.   Lab Results  Component Value Date   TSH 6.40 (H) 05/30/2018   Hyperlipidemia- maintained statin.   Lab Results  Component Value Date   CHOL 174 02/26/2018   HDL 55.10 02/26/2018   LDLCALC 89 02/26/2018   LDLDIRECT 102.0 07/08/2014   TRIG 148.0 02/26/2018   CHOLHDL 3 02/26/2018   Asthma- improved on Wixela  OAB- reports stable on myrbetriq Lab Results  Component Value Date   HGBA1C 5.9 02/26/2018    Review of Systems See HPI  Past Medical History:  Diagnosis Date  . Abnormally small mouth   . Achilles tendon disorder, right   . Anemia   . Anxiety   . Arthritis    hips and knees  . Asthma    daily inhaler, prn inhaler and neb.  . Asthma due to environmental allergies   . Breast cancer ([place]) 01/2014   left  . Cancer ([place])   . Chest pain   . COPD (chronic obstructive pulmonary disease) ([place])   . Dental bridge present    upper front and lower right  . Dental crowns present    x 3  . Depression   . Esophageal spasm    reports since her chemo and breast surgery  she developed esophageal spasms and reports this is in the past has casue her 02 to desat in the  70s , denies sycnope in relation to this , does report hx of vertigo as well   . Family history of anesthesia complication    twin brother aspirated and died on OR table, per pt.  . Fibromyalgia   . Gallbladder disease   . GERD (gastroesophageal reflux disease)   . H/O blood clots    had blood to  PICC line   . History of gastric ulcer    as a teenager  . History of seizure age 2   as a reaction to Penicillin - no seizures since  . History of stomach ulcers   . History of thyroid cancer    s/p  thyroidectomy  . Hyperlipidemia   . Hypothyroidism   . Joint pain   . Left knee injury   . Liver disorder    seen when she had her hysterectomy , reports she was told it was  " small lesion" ; but asymptomatic   . Lymphedema    left arm  . Migraines   . Multiple food allergies   . Obesity   . OSA (obstructive sleep apnea) 02/16/2016  . Palpitations    reports no longer experiences  . Personal history of chemotherapy 2015  . Personal history of radiation therapy 2015   Left  . Prediabetes   . Rheumatoid arthritis ([place])   . SOB (shortness of breath)   . Swelling of both ankles   . Tinnitus   . UTI (lower urinary tract infection) 02/17/2014  . Vertigo    last episode  was 2 weeks ago   . Vitamin D deficiency   . Wears contact lenses    left eye only   . Wears hearing aid in both ears      Social History  Socioeconomic History  . Marital status: Married    Spouse name: Not on file  . Number of children: Not on file  . Years of education: Not on file  . Highest education level: Not on file  Occupational History  . 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 Financial resource strain: Not on file  . Food insecurity:    Worry: Not on file    Inability: Not on file  . Transportation needs:    Medical: Not on file    Non-medical: Not on file  Tobacco Use  . Smoking status: Never Smoker  . Smokeless tobacco: Never Used  Substance and Sexual Activity  . Alcohol use: No    Alcohol/week: 0.0 standard drinks  . Drug use: No  . Sexual activity: Not Currently    Partners: Male    Comment: menarche age 50, P 2, first birth age 40, menopause age 42, Premarin x 10 yrs  Lifestyle  . Physical activity:    Days per week: Not on file    Minutes per session: Not on file  . Stress: Not on file  Relationships  . Social connections:    Talks on phone: Not on file    Gets together: Not on file    Attends religious service: Not on file    Active member  of club or organization: Not on file    Attends meetings of clubs or organizations: Not on file    Relationship status: Not on file  . Intimate partner violence:    Fear of current or ex partner: Not on file    Emotionally abused: Not on file    Physically abused: Not on file    Forced sexual activity: Not on file  Other Topics Concern  . Not on file  Social History Narrative   Regular exercise: yes       Past Surgical History:  Procedure Laterality Date  . ABDOMINAL HYSTERECTOMY  2004   complete  . ACHILLES TENDON REPAIR Right   . APPENDECTOMY  2004  . BREAST EXCISIONAL BIOPSY    . BREAST LUMPECTOMY Left    2015  . BREAST LUMPECTOMY WITH NEEDLE LOCALIZATION AND AXILLARY SENTINEL LYMPH NODE BX Left 02/23/2014   Procedure: BREAST LUMPECTOMY WITH NEEDLE LOCALIZATION AND AXILLARY SENTINEL LYMPH NODE BIOPSY;  Surgeon: Excell Seltzer, MD;  Location: [facility];  Service: General;  Laterality: Left;  . BREAST SURGERY  2011   left breast biposy  . CHOLECYSTECTOMY  1990  . COLONOSCOPY W/ POLYPECTOMY  06/2009  . EYE SURGERY Right 2013   exc. warts from underneath eyelid  . INCONTINENCE SURGERY  2004  . KNEE ARTHROSCOPY Bilateral    x 6 each knee  . LIGAMENT REPAIR Right    thumb/wrist  . ORIF TOE FRACTURE Right    great toe  . PORTACATH PLACEMENT N/A 03/24/2014   Procedure: INSERTION PORT-A-CATH;  Surgeon: Excell Seltzer, MD;  Location: [facility];  Service: General;  Laterality: N/A;; removed   . THYROIDECTOMY  2000  . TONSILLECTOMY AND ADENOIDECTOMY  2000  . TOTAL KNEE ARTHROPLASTY Left 12/03/2017   Procedure: LEFT TOTAL KNEE ARTHROPLASTY;  Surgeon: Gaynelle Arabian, MD;  [facility];  Service: Orthopedics;  Laterality: Left;  . TUMOR EXCISION     from thoracic spine    Family History  Problem Relation Age of Onset  . Alcohol abuse Mother   . Arthritis Mother   . Hypertension Mother   .  Bipolar disorder Mother   . Breast cancer Mother  38       unconfirmed  . Lung cancer Mother 64       smoker  . Hyperlipidemia Mother   . Stroke Mother   . Cancer Mother   . Depression Mother   . Anxiety disorder Mother   . Alcoholism Mother   . Drug abuse Mother   . Eating disorder Mother   . Obesity Mother   . Alcohol abuse Father   . Cancer Father        lung  . Hyperlipidemia Father   . Kidney disease Father   . Diabetes Father   . Hypertension Father   . Cystic kidney disease Father   . Thyroid disease Father   . Liver disease Father   . Alcoholism Father   . Arthritis Maternal Grandmother   . Diabetes Paternal Grandmother   . Thyroid cancer Sister 75       type?; currently 70  . Other Sister        ovarian tumor @ 48; TAH/BSO  . Breast cancer Sister   . Thyroid cancer Brother        dx 18s; currently 49  . Breast cancer Maternal Aunt        dx 22s; deceased 58  . Thyroid cancer Paternal Aunt        All 3 paternal aunts with thyroid ca in 30s/40s  . Lung cancer Paternal Aunt        2 of 3 paternal aunts with lung cancer    Allergies  Allergen Reactions  . Bee Venom Anaphylaxis  . Contrast Media [Iodinated Diagnostic Agents] Shortness Of Breath  . Iodine Other (See Comments)    CARDIAC ARREST  . Latex Anaphylaxis and Rash  . Penicillins Shortness Of Breath, Rash and Other (See Comments)    SEIZURE  . Shellfish Allergy Shortness Of Breath and Rash  . Aspirin Rash and Other (See Comments)    GI UPSET  . Erythromycin Swelling and Rash    SWELLING OF JOINTS  . Lidocaine Swelling    SWELLING OF MOUTH AND THROAT  . Symbicort [Budesonide-Formoterol Fumarate] Other (See Comments)    BURNING OF TONGUE AND LIPS  . Betadine [Povidone Iodine]     Rash. Breathing problems.   . Codeine Rash  . Compazine [Prochlorperazine Maleate] Rash    Rash on face,chest, arms, back  . Pentazocine Lactate Rash  . Sulfonamide Derivatives Rash    Current Outpatient Medications on File Prior to Visit  Medication Sig  Dispense Refill  . albuterol (PROAIR HFA) 108 (90 Base) MCG/ACT inhaler INHALE 2 PUFFS INTO THE LUNGS EVERY 6 HOURS AS NEEDED 3 Inhaler 1  . albuterol (PROVENTIL) (5 MG/ML) 0.5% nebulizer solution Take 2.5 mg by nebulization every 6 (six) hours as needed for wheezing or shortness of breath.    Marland Kitchen amLODipine (NORVASC) 10 MG tablet Take 1 tablet (10 mg total) by mouth daily. 90 tablet 3  . amLODipine (NORVASC) 5 MG tablet TAKE 1 TABLET (5 MG TOTAL) BY MOUTH DAILY. 30 tablet 3  . anastrozole (ARIMIDEX) 1 MG tablet Take 1 tablet (1 mg total) by mouth daily. 90 tablet 3  . atorvastatin (LIPITOR) 20 MG tablet TAKE 1 TABLET BY MOUTH  DAILY 90 tablet 1  . diphenhydrAMINE (BENADRYL) 25 MG tablet Take 1 tablet an hour before MRI.  Then take 1 tablet at bedtime as needed, per your usual regimen. (Patient taking differently: Take 25 mg  by mouth at bedtime as needed for sleep. Take 1 tablet an hour before MRI.  Then take 1 tablet at bedtime as n) 30 tablet 0  . DULoxetine (CYMBALTA) 60 MG capsule TAKE 1 CAPSULE BY MOUTH  DAILY 90 capsule 1  . EPINEPHrine 0.3 mg/0.3 mL IJ SOAJ injection Inject 0.3 mLs (0.3 mg total) into the muscle once. 2 Device 0  . Fluticasone-Salmeterol (WIXELA INHUB) 250-50 MCG/DOSE AEPB Inhale 1 puff into the lungs 2 (two) times daily. 180 each 3  . levothyroxine (SYNTHROID, LEVOTHROID) 200 MCG tablet Take 1 tablet (200 mcg total) by mouth daily before breakfast. 30 tablet 3  . meloxicam (MOBIC) 7.5 MG tablet TAKE 1 TABLET BY MOUTH  DAILY 90 tablet 1  . MYRBETRIQ 50 MG TB24 tablet TAKE 1 TABLET BY MOUTH  DAILY 90 tablet 1  . nitroGLYCERIN (NITROSTAT) 0.4 MG SL tablet Place 1 tablet (0.4 mg total) under the tongue every 5 (five) minutes as needed for chest pain. 30 tablet 0   No current facility-administered medications on file prior to visit.     BP (!) 144/75 (BP Location: Right Arm, Patient Position: Sitting, Cuff Size: Large)   Pulse 72   Temp 98.7 F (37.1 C) (Oral)   Resp 16   Ht  5\' 7"  (1.702 m)   Wt 280 lb (127 kg)   SpO2 98%   BMI 43.85 kg/m       Objective:   Physical Exam Constitutional:      Appearance: She is well-developed.  Neck:     Musculoskeletal: Neck supple.     Thyroid: No thyromegaly.  Cardiovascular:     Rate and Rhythm: Normal rate and regular rhythm.     Heart sounds: Normal heart sounds. No murmur.  Pulmonary:     Effort: Pulmonary effort is normal. No respiratory distress.     Breath sounds: Wheezing present.     Comments: Very soft expiratory wheeze Skin:    General: Skin is warm and dry.     Comments: Thickened discolored left great toenail  Neurological:     Mental Status: She is alert and oriented to person, place, and time.  Psychiatric:        Behavior: Behavior normal.        Thought Content: Thought content normal.        Judgment: Judgment normal.            Assessment & Plan:  Onychomycosis- rx with lamisil. Follow up in 1 month for lft  HTN- fair bp control. Continue current meds/doses, follow up in 1 month for bp recheck.  Hyperlipidemia- lipids stable. Continue statin.   Asthma- fair control. Continue wixela.  OAB- stable on myrbetriq, continue same.   Hypothyroid- feeling better on increased dose synthroid. Repeat tsh next visit.

## 2018-07-04 ENCOUNTER — Other Ambulatory Visit: Payer: Self-pay | Admitting: Family

## 2018-07-04 DIAGNOSIS — E039 Hypothyroidism, unspecified: Secondary | ICD-10-CM

## 2018-07-05 ENCOUNTER — Encounter: Payer: Self-pay | Admitting: Family

## 2018-07-05 ENCOUNTER — Ambulatory Visit (INDEPENDENT_AMBULATORY_CARE_PROVIDER_SITE_OTHER): Payer: 59 | Admitting: Family

## 2018-07-05 VITALS — BP 133/66 | HR 70 | Temp 98.4°F | Resp 16 | Ht 67.0 in | Wt 278.0 lb

## 2018-07-05 DIAGNOSIS — Z5181 Encounter for therapeutic drug level monitoring: Secondary | ICD-10-CM

## 2018-07-05 DIAGNOSIS — E039 Hypothyroidism, unspecified: Secondary | ICD-10-CM | POA: Diagnosis not present

## 2018-07-05 DIAGNOSIS — B351 Tinea unguium: Secondary | ICD-10-CM

## 2018-07-05 DIAGNOSIS — I10 Essential (primary) hypertension: Secondary | ICD-10-CM

## 2018-07-05 LAB — HEPATIC FUNCTION PANEL
ALBUMIN: 4.4 g/dL (ref 3.5–5.2)
ALT: 36 U/L — ABNORMAL HIGH (ref 0–35)
AST: 23 U/L (ref 0–37)
Alkaline Phosphatase: 71 U/L (ref 39–117)
Bilirubin, Direct: 0.1 mg/dL (ref 0.0–0.3)
Total Bilirubin: 0.5 mg/dL (ref 0.2–1.2)
Total Protein: 6.6 g/dL (ref 6.0–8.3)

## 2018-07-05 LAB — TSH: TSH: 2.01 u[IU]/mL (ref 0.35–4.50)

## 2018-07-05 MED ORDER — FLUTICASONE-SALMETEROL 250-50 MCG/DOSE IN AEPB: 1.0000 | INHALATION_SPRAY | Freq: Two times a day (BID) | RESPIRATORY_TRACT | 3 refills | Status: DC

## 2018-07-05 MED ORDER — TERBINAFINE HCL 250 MG PO TABS: 250.0000 mg | ORAL_TABLET | Freq: Every day | ORAL | 1 refills | Status: DC

## 2018-07-05 NOTE — Patient Instructions (Signed)
Please complete lab work prior to leaving. Continue current dose of amlodipine.

## 2018-07-05 NOTE — Progress Notes (Signed)
Subjective:    Patient ID: Michelle Kennedy, female    DOB: [birth year], 63 y.o.   MRN: [id number]  HPI   Patient is a 63 yr old female who presents today for follow up.  HTN- she is maintained on amlodipine 10mg .  BP Readings from Last 3 Encounters:  07/05/18 133/66  06/07/18 (!) 144/75  05/01/18 (!) 149/86   Onychomycosis- started lamisil 1 month ago. Reports improvement in the discoloration of her toenail.    Hypothyroid- synthroid was increased last visit following elevated TSH. Reports that she has improved energy.     Review of Systems See HPI  Past Medical History:  Diagnosis Date  . Abnormally small mouth   . Achilles tendon disorder, right   . Anemia   . Anxiety   . Arthritis    hips and knees  . Asthma    daily inhaler, prn inhaler and neb.  . Asthma due to environmental allergies   . Breast cancer ([place]) 01/2014   left  . Cancer ([place])   . Chest pain   . COPD (chronic obstructive pulmonary disease) ([place])   . Dental bridge present    upper front and lower right  . Dental crowns present    x 3  . Depression   . Esophageal spasm    reports since her chemo and breast surgery  she developed esophageal spasms and reports this is in the past has casue her 02 to desat in the  70s , denies sycnope in relation to this , does report hx of vertigo as well   . Family history of anesthesia complication    twin brother aspirated and died on OR table, per pt.  . Fibromyalgia   . Gallbladder disease   . GERD (gastroesophageal reflux disease)   . H/O blood clots    had blood to  PICC line   . History of gastric ulcer    as a teenager  . History of seizure age 89   as a reaction to Penicillin - no seizures since  . History of stomach ulcers   . History of thyroid cancer    s/p thyroidectomy  . Hyperlipidemia   . Hypothyroidism   . Joint pain   . Left knee injury   . Liver disorder    seen when she had her hysterectomy , reports she was told it was  " small  lesion" ; but asymptomatic   . Lymphedema    left arm  . Migraines   . Multiple food allergies   . Obesity   . OSA (obstructive sleep apnea) 02/16/2016  . Palpitations    reports no longer experiences  . Personal history of chemotherapy 2015  . Personal history of radiation therapy 2015   Left  . Prediabetes   . Rheumatoid arthritis ([place])   . SOB (shortness of breath)   . Swelling of both ankles   . Tinnitus   . UTI (lower urinary tract infection) 02/17/2014  . Vertigo    last episode  was 2 weeks ago   . Vitamin D deficiency   . Wears contact lenses    left eye only   . Wears hearing aid in both ears      Social History   Socioeconomic History  . Marital status: Married    Spouse name: Not on file  . Number of children: Not on file  . Years of education: Not on file  . Highest education level: Not on file  Occupational History  . 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 Financial resource strain: Not on file  . Food insecurity:    Worry: Not on file    Inability: Not on file  . Transportation needs:    Medical: Not on file    Non-medical: Not on file  Tobacco Use  . Smoking status: Never Smoker  . Smokeless tobacco: Never Used  Substance and Sexual Activity  . Alcohol use: No    Alcohol/week: 0.0 standard drinks  . Drug use: No  . Sexual activity: Not Currently    Partners: Male    Comment: menarche age 38, P 2, first birth age 28, menopause age 25, Premarin x 10 yrs  Lifestyle  . Physical activity:    Days per week: Not on file    Minutes per session: Not on file  . Stress: Not on file  Relationships  . Social connections:    Talks on phone: Not on file    Gets together: Not on file    Attends religious service: Not on file    Active member of club or organization: Not on file    Attends meetings of clubs or organizations: Not on file    Relationship status: Not on file  . Intimate partner violence:    Fear of current or ex  partner: Not on file    Emotionally abused: Not on file    Physically abused: Not on file    Forced sexual activity: Not on file  Other Topics Concern  . Not on file  Social History Narrative   Regular exercise: yes       Past Surgical History:  Procedure Laterality Date  . ABDOMINAL HYSTERECTOMY  2004   complete  . ACHILLES TENDON REPAIR Right   . APPENDECTOMY  2004  . BREAST EXCISIONAL BIOPSY    . BREAST LUMPECTOMY Left    2015  . BREAST LUMPECTOMY WITH NEEDLE LOCALIZATION AND AXILLARY SENTINEL LYMPH NODE BX Left 02/23/2014   Procedure: BREAST LUMPECTOMY WITH NEEDLE LOCALIZATION AND AXILLARY SENTINEL LYMPH NODE BIOPSY;  Surgeon: Excell Seltzer, MD;  Location: [facility];  Service: General;  Laterality: Left;  . BREAST SURGERY  2011   left breast biposy  . CHOLECYSTECTOMY  1990  . COLONOSCOPY W/ POLYPECTOMY  06/2009  . EYE SURGERY Right 2013   exc. warts from underneath eyelid  . INCONTINENCE SURGERY  2004  . KNEE ARTHROSCOPY Bilateral    x 6 each knee  . LIGAMENT REPAIR Right    thumb/wrist  . ORIF TOE FRACTURE Right    great toe  . PORTACATH PLACEMENT N/A 03/24/2014   Procedure: INSERTION PORT-A-CATH;  Surgeon: Excell Seltzer, MD;  Location: [facility];  Service: General;  Laterality: N/A;; removed   . THYROIDECTOMY  2000  . TONSILLECTOMY AND ADENOIDECTOMY  2000  . TOTAL KNEE ARTHROPLASTY Left 12/03/2017   Procedure: LEFT TOTAL KNEE ARTHROPLASTY;  Surgeon: Gaynelle Arabian, MD;  Location: [facility];  Service: Orthopedics;  Laterality: Left;  . TUMOR EXCISION     from thoracic spine    Family History  Problem Relation Age of Onset  . Alcohol abuse Mother   . Arthritis Mother   . Hypertension Mother   . Bipolar disorder Mother   . Breast cancer Mother 40       unconfirmed  . Lung cancer Mother 47       smoker  . Hyperlipidemia Mother   .  Stroke Mother   . Cancer Mother   . Depression Mother   . Anxiety disorder Mother   .  Alcoholism Mother   . Drug abuse Mother   . Eating disorder Mother   . Obesity Mother   . Alcohol abuse Father   . Cancer Father        lung  . Hyperlipidemia Father   . Kidney disease Father   . Diabetes Father   . Hypertension Father   . Cystic kidney disease Father   . Thyroid disease Father   . Liver disease Father   . Alcoholism Father   . Arthritis Maternal Grandmother   . Diabetes Paternal Grandmother   . Thyroid cancer Sister 33       type?; currently 63  . Other Sister        ovarian tumor @ 91; TAH/BSO  . Breast cancer Sister   . Thyroid cancer Brother        dx 8s; currently 35  . Breast cancer Maternal Aunt        dx 56s; deceased 70  . Thyroid cancer Paternal Aunt        All 3 paternal aunts with thyroid ca in 30s/40s  . Lung cancer Paternal Aunt        2 of 3 paternal aunts with lung cancer    Allergies  Allergen Reactions  . Bee Venom Anaphylaxis  . Contrast Media [Iodinated Diagnostic Agents] Shortness Of Breath  . Iodine Other (See Comments)    CARDIAC ARREST  . Latex Anaphylaxis and Rash  . Penicillins Shortness Of Breath, Rash and Other (See Comments)    SEIZURE  . Shellfish Allergy Shortness Of Breath and Rash  . Aspirin Rash and Other (See Comments)    GI UPSET  . Erythromycin Swelling and Rash    SWELLING OF JOINTS  . Lidocaine Swelling    SWELLING OF MOUTH AND THROAT  . Symbicort [Budesonide-Formoterol Fumarate] Other (See Comments)    BURNING OF TONGUE AND LIPS  . Betadine [Povidone Iodine]     Rash. Breathing problems.   . Codeine Rash  . Compazine [Prochlorperazine Maleate] Rash    Rash on face,chest, arms, back  . Pentazocine Lactate Rash  . Sulfonamide Derivatives Rash    Current Outpatient Medications on File Prior to Visit  Medication Sig Dispense Refill  . albuterol (PROAIR HFA) 108 (90 Base) MCG/ACT inhaler INHALE 2 PUFFS INTO THE LUNGS EVERY 6 HOURS AS NEEDED 3 Inhaler 1  . albuterol (PROVENTIL) (5 MG/ML) 0.5% nebulizer  solution Take 2.5 mg by nebulization every 6 (six) hours as needed for wheezing or shortness of breath.    Marland Kitchen amLODipine (NORVASC) 10 MG tablet Take 1 tablet (10 mg total) by mouth daily. 90 tablet 3  . anastrozole (ARIMIDEX) 1 MG tablet Take 1 tablet (1 mg total) by mouth daily. 90 tablet 3  . atorvastatin (LIPITOR) 20 MG tablet TAKE 1 TABLET BY MOUTH  DAILY 90 tablet 1  . diphenhydrAMINE (BENADRYL) 25 MG tablet Take 1 tablet an hour before MRI.  Then take 1 tablet at bedtime as needed, per your usual regimen. (Patient taking differently: Take 25 mg by mouth at bedtime as needed for sleep. Take 1 tablet an hour before MRI.  Then take 1 tablet at bedtime as n) 30 tablet 0  . DULoxetine (CYMBALTA) 60 MG capsule TAKE 1 CAPSULE BY MOUTH  DAILY 90 capsule 1  . EPINEPHrine 0.3 mg/0.3 mL IJ SOAJ injection Inject 0.3 mLs (  0.3 mg total) into the muscle once. 2 Device 0  . levothyroxine (SYNTHROID, LEVOTHROID) 200 MCG tablet Take 1 tablet (200 mcg total) by mouth daily before breakfast. 30 tablet 3  . meloxicam (MOBIC) 7.5 MG tablet TAKE 1 TABLET BY MOUTH  DAILY 90 tablet 1  . MYRBETRIQ 50 MG TB24 tablet TAKE 1 TABLET BY MOUTH  DAILY 90 tablet 1  . terbinafine (LAMISIL) 250 MG tablet Take 1 tablet (250 mg total) by mouth daily. 30 tablet 0   No current facility-administered medications on file prior to visit.     BP 133/66 (BP Location: Left Arm, Patient Position: Sitting, Cuff Size: Small)   Pulse 70   Temp 98.4 F (36.9 C) (Oral)   Resp 16   Ht 5\' 7"  (1.702 m)   Wt 278 lb (126.1 kg)   SpO2 99%   BMI 43.54 kg/m       Objective:   Physical Exam Constitutional:      Appearance: She is well-developed.  Cardiovascular:     Rate and Rhythm: Normal rate and regular rhythm.     Pulses:          Posterior tibial pulses are 2+ on the right side and 2+ on the left side.     Heart sounds: Normal heart sounds. No murmur.  Pulmonary:     Effort: Pulmonary effort is normal. No respiratory distress.      Breath sounds: Normal breath sounds. No wheezing.  Musculoskeletal:        General: Swelling present.  Psychiatric:        Behavior: Behavior normal.        Thought Content: Thought content normal.        Judgment: Judgment normal.    Skin: improved nail at base of left great toe        Assessment & Plan:  HTN- bp is improved. Continue current dose of amlodipine.  Onychomycosis- improving. Obtain follow up lft.  Continue lamisil x 8 more weeks if LFT normal.  Hypothyroid- clinically stable on current dose of synthroid. Obtain follow up TSH

## 2018-07-18 ENCOUNTER — Other Ambulatory Visit: Payer: Self-pay | Admitting: Family

## 2018-07-31 ENCOUNTER — Ambulatory Visit (INDEPENDENT_AMBULATORY_CARE_PROVIDER_SITE_OTHER): Payer: 59 | Admitting: Family

## 2018-07-31 ENCOUNTER — Other Ambulatory Visit: Payer: Self-pay

## 2018-07-31 ENCOUNTER — Encounter: Payer: Self-pay | Admitting: Family

## 2018-07-31 ENCOUNTER — Ambulatory Visit (HOSPITAL_BASED_OUTPATIENT_CLINIC_OR_DEPARTMENT_OTHER)
Admission: RE | Admit: 2018-07-31 | Discharge: 2018-07-31 | Disposition: A | Discharge status: Home / Self Care | Payer: 59 | Source: Ambulatory Visit | Attending: Family | Admitting: Family

## 2018-07-31 VITALS — BP 126/74 | HR 73 | Temp 98.3°F | Resp 18 | Ht 67.0 in | Wt 281.6 lb

## 2018-07-31 DIAGNOSIS — R05 Cough: Secondary | ICD-10-CM | POA: Insufficient documentation

## 2018-07-31 DIAGNOSIS — R059 Cough, unspecified: Secondary | ICD-10-CM

## 2018-07-31 DIAGNOSIS — J45901 Unspecified asthma with (acute) exacerbation: Secondary | ICD-10-CM

## 2018-07-31 DIAGNOSIS — B351 Tinea unguium: Secondary | ICD-10-CM

## 2018-07-31 DIAGNOSIS — Z5181 Encounter for therapeutic drug level monitoring: Secondary | ICD-10-CM

## 2018-07-31 MED ORDER — PREDNISONE 10 MG PO TABS: ORAL_TABLET | ORAL | 0 refills | Status: DC

## 2018-07-31 MED ORDER — AZITHROMYCIN 250 MG PO TABS: ORAL_TABLET | ORAL | 0 refills | Status: DC

## 2018-07-31 MED ORDER — ALBUTEROL SULFATE HFA 108 (90 BASE) MCG/ACT IN AERS: INHALATION_SPRAY | RESPIRATORY_TRACT | 1 refills | Status: DC

## 2018-07-31 MED ORDER — TERBINAFINE HCL 250 MG PO TABS: 250.0000 mg | ORAL_TABLET | Freq: Every day | ORAL | 0 refills | Status: DC

## 2018-07-31 MED ORDER — ALBUTEROL SULFATE (5 MG/ML) 0.5% IN NEBU: 2.5000 mg | INHALATION_SOLUTION | Freq: Four times a day (QID) | RESPIRATORY_TRACT | 3 refills | Status: DC | PRN

## 2018-07-31 NOTE — Progress Notes (Signed)
Subjective:    Patient ID: Michelle Kennedy, female    DOB: Aug 09, 1955, 63 y.o.   MRN: [id number]  HPI   Patient presents today for follow up and to discuss ongoing asthma symptoms.   Onychomycosis- improving. Continues lamisil. Declines foot exam today.   Asthma- has had a cold for 1.5 months.  Reports tahther breathing has become more of a problem, especially in the evenings, "so I can't cathc my breathing at all.  Feels SOB.  + fatigue.  + lethargy.reports mood is OK.    Wt Readings from Last 3 Encounters:  07/31/18 281 lb 9.6 oz (127.7 kg)  07/05/18 278 lb (126.1 kg)  06/07/18 280 lb (127 kg)      Review of Systems See HPI  Past Medical History:  Diagnosis Date  . Abnormally small mouth   . Achilles tendon disorder, right   . Anemia   . Anxiety   . Arthritis    hips and knees  . Asthma    daily inhaler, prn inhaler and neb.  . Asthma due to environmental allergies   . Breast cancer ([place]) 01/2014   left  . Cancer ([place])   . Chest pain   . COPD (chronic obstructive pulmonary disease) ([place])   . Dental bridge present    upper front and lower right  . Dental crowns present    x 3  . Depression   . Esophageal spasm    reports since her chemo and breast surgery  she developed esophageal spasms and reports this is in the past has casue her 02 to desat in the  70s , denies sycnope in relation to this , does report hx of vertigo as well   . Family history of anesthesia complication    twin brother aspirated and died on OR table, per pt.  . Fibromyalgia   . Gallbladder disease   . GERD (gastroesophageal reflux disease)   . H/O blood clots    had blood to  PICC line   . History of gastric ulcer    as a teenager  . History of seizure age 55   as a reaction to Penicillin - no seizures since  . History of stomach ulcers   . History of thyroid cancer    s/p thyroidectomy  . Hyperlipidemia   . Hypothyroidism   . Joint pain   . Left knee injury   . Liver disorder    seen when she had her hysterectomy , reports she was told it was  " small lesion" ; but asymptomatic   . Lymphedema    left arm  . Migraines   . Multiple food allergies   . Obesity   . OSA (obstructive sleep apnea) 02/16/2016  . Palpitations    reports no longer experiences  . Personal history of chemotherapy 2015  . Personal history of radiation therapy 2015   Left  . Prediabetes   . Rheumatoid arthritis ([place])   . SOB (shortness of breath)   . Swelling of both ankles   . Tinnitus   . UTI (lower urinary tract infection) 02/17/2014  . Vertigo    last episode  was 2 weeks ago   . Vitamin D deficiency   . Wears contact lenses    left eye only   . Wears hearing aid in both ears      Social History   Socioeconomic History  . Marital status: Married    Spouse name: Not on file  .  Number of children: Not on file  . Years of education: Not on file  . Highest education level: Not on file  Occupational History  . 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 Financial resource strain: Not on file  . Food insecurity:    Worry: Not on file    Inability: Not on file  . Transportation needs:    Medical: Not on file    Non-medical: Not on file  Tobacco Use  . Smoking status: Never Smoker  . Smokeless tobacco: Never Used  Substance and Sexual Activity  . Alcohol use: No    Alcohol/week: 0.0 standard drinks  . Drug use: No  . Sexual activity: Not Currently    Partners: Male    Comment: menarche age 76, P 2, first birth age 5, menopause age 12, Premarin x 10 yrs  Lifestyle  . Physical activity:    Days per week: Not on file    Minutes per session: Not on file  . Stress: Not on file  Relationships  . Social connections:    Talks on phone: Not on file    Gets together: Not on file    Attends religious service: Not on file    Active member of club or organization: Not on file    Attends meetings of clubs or organizations: Not on file    Relationship  status: Not on file  . Intimate partner violence:    Fear of current or ex partner: Not on file    Emotionally abused: Not on file    Physically abused: Not on file    Forced sexual activity: Not on file  Other Topics Concern  . Not on file  Social History Narrative   Regular exercise: yes       Past Surgical History:  Procedure Laterality Date  . ABDOMINAL HYSTERECTOMY  2004   complete  . ACHILLES TENDON REPAIR Right   . APPENDECTOMY  2004  . BREAST EXCISIONAL BIOPSY    . BREAST LUMPECTOMY Left    2015  . BREAST LUMPECTOMY WITH NEEDLE LOCALIZATION AND AXILLARY SENTINEL LYMPH NODE BX Left 02/23/2014   Procedure: BREAST LUMPECTOMY WITH NEEDLE LOCALIZATION AND AXILLARY SENTINEL LYMPH NODE BIOPSY;  Surgeon: Excell Seltzer, MD;  Location: [facility];  Service: General;  Laterality: Left;  . BREAST SURGERY  2011   left breast biposy  . CHOLECYSTECTOMY  1990  . COLONOSCOPY W/ POLYPECTOMY  06/2009  . EYE SURGERY Right 2013   exc. warts from underneath eyelid  . INCONTINENCE SURGERY  2004  . KNEE ARTHROSCOPY Bilateral    x 6 each knee  . LIGAMENT REPAIR Right    thumb/wrist  . ORIF TOE FRACTURE Right    great toe  . PORTACATH PLACEMENT N/A 03/24/2014   Procedure: INSERTION PORT-A-CATH;  Surgeon: Excell Seltzer, MD;  Location: [facility];  Service: General;  Laterality: N/A;; removed   . THYROIDECTOMY  2000  . TONSILLECTOMY AND ADENOIDECTOMY  2000  . TOTAL KNEE ARTHROPLASTY Left 12/03/2017   Procedure: LEFT TOTAL KNEE ARTHROPLASTY;  Surgeon: Gaynelle Arabian, MD;  [facility];  Service: Orthopedics;  Laterality: Left;  . TUMOR EXCISION     from thoracic spine    Family History  Problem Relation Age of Onset  . Alcohol abuse Mother   . Arthritis Mother   . Hypertension Mother   . Bipolar disorder Mother   . Breast cancer Mother 55  unconfirmed  . Lung cancer Mother 94       smoker  . Hyperlipidemia Mother   . Stroke Mother   .  Cancer Mother   . Depression Mother   . Anxiety disorder Mother   . Alcoholism Mother   . Drug abuse Mother   . Eating disorder Mother   . Obesity Mother   . Alcohol abuse Father   . Cancer Father        lung  . Hyperlipidemia Father   . Kidney disease Father   . Diabetes Father   . Hypertension Father   . Cystic kidney disease Father   . Thyroid disease Father   . Liver disease Father   . Alcoholism Father   . Arthritis Maternal Grandmother   . Diabetes Paternal Grandmother   . Thyroid cancer Sister 8       type?; currently 22  . Other Sister        ovarian tumor @ 84; TAH/BSO  . Breast cancer Sister   . Thyroid cancer Brother        dx 17s; currently 77  . Breast cancer Maternal Aunt        dx 2s; deceased 68  . Thyroid cancer Paternal Aunt        All 3 paternal aunts with thyroid ca in 30s/40s  . Lung cancer Paternal Aunt        2 of 3 paternal aunts with lung cancer    Allergies  Allergen Reactions  . Bee Venom Anaphylaxis  . Contrast Media [Iodinated Diagnostic Agents] Shortness Of Breath  . Iodine Other (See Comments)    CARDIAC ARREST  . Latex Anaphylaxis and Rash  . Penicillins Shortness Of Breath, Rash and Other (See Comments)    SEIZURE  . Shellfish Allergy Shortness Of Breath and Rash  . Aspirin Rash and Other (See Comments)    GI UPSET  . Erythromycin Swelling and Rash    SWELLING OF JOINTS  . Lidocaine Swelling    SWELLING OF MOUTH AND THROAT  . Symbicort [Budesonide-Formoterol Fumarate] Other (See Comments)    BURNING OF TONGUE AND LIPS  . Betadine [Povidone Iodine]     Rash. Breathing problems.   . Codeine Rash  . Compazine [Prochlorperazine Maleate] Rash    Rash on face,chest, arms, back  . Pentazocine Lactate Rash  . Sulfonamide Derivatives Rash    Current Outpatient Medications on File Prior to Visit  Medication Sig Dispense Refill  . amLODipine (NORVASC) 10 MG tablet Take 1 tablet (10 mg total) by mouth daily. 90 tablet 3  .  anastrozole (ARIMIDEX) 1 MG tablet Take 1 tablet (1 mg total) by mouth daily. 90 tablet 3  . atorvastatin (LIPITOR) 20 MG tablet TAKE 1 TABLET BY MOUTH  DAILY 90 tablet 1  . diphenhydrAMINE (BENADRYL) 25 MG tablet Take 1 tablet an hour before MRI.  Then take 1 tablet at bedtime as needed, per your usual regimen. (Patient taking differently: Take 25 mg by mouth at bedtime as needed for sleep. Take 1 tablet an hour before MRI.  Then take 1 tablet at bedtime as n) 30 tablet 0  . DULoxetine (CYMBALTA) 60 MG capsule TAKE 1 CAPSULE BY MOUTH  DAILY 90 capsule 1  . EPINEPHrine 0.3 mg/0.3 mL IJ SOAJ injection Inject 0.3 mLs (0.3 mg total) into the muscle once. 2 Device 0  . Fluticasone-Salmeterol (WIXELA INHUB) 250-50 MCG/DOSE AEPB Inhale 1 puff into the lungs 2 (two) times daily. 180 each 3  .  levothyroxine (SYNTHROID, LEVOTHROID) 200 MCG tablet Take 1 tablet (200 mcg total) by mouth daily before breakfast. 30 tablet 3  . meloxicam (MOBIC) 7.5 MG tablet TAKE 1 TABLET BY MOUTH  DAILY 90 tablet 1  . MYRBETRIQ 50 MG TB24 tablet TAKE 1 TABLET BY MOUTH  DAILY 90 tablet 1   No current facility-administered medications on file prior to visit.     BP 126/74 (BP Location: Right Arm, Patient Position: Sitting, Cuff Size: Large)   Pulse 73   Temp 98.3 F (36.8 C) (Oral)   Resp 18   Ht 5\' 7"  (1.702 m)   Wt 281 lb 9.6 oz (127.7 kg)   SpO2 98%   BMI 44.10 kg/m       Objective:   Physical Exam Constitutional:      Appearance: She is well-developed.  HENT:     Head: Normocephalic and atraumatic.     Right Ear: Tympanic membrane and ear canal normal.     Left Ear: Tympanic membrane and ear canal normal.  Cardiovascular:     Rate and Rhythm: Normal rate and regular rhythm.     Heart sounds: Normal heart sounds. No murmur.  Pulmonary:     Effort: Pulmonary effort is normal. No respiratory distress.     Breath sounds: Wheezing present.  Musculoskeletal:     Right lower leg: 3+ Edema present.     Left  lower leg: 3+ Edema present.  Psychiatric:        Behavior: Behavior normal.        Thought Content: Thought content normal.        Judgment: Judgment normal.           Assessment & Plan:  Asthma with acute exacerbation- CXR performed and cleared for pneumonia.  Pt is advised as follows:  Please use albuterol every 6 hours for the next week. Start prednisone taper and zpak. Call if new/worsening symptoms or if not improved in 2-3 days.   She also inquires about travelling to [facility] for her Son's wedding in 3 weeks.  I told the patient that she would be at high risk for complications and death if she contracts Covid-19. She verbalizes understanding but advised pt it is ultimately her choice to go though I advised her not to go.   Onychomycosis- check LFT.  If stable, plan to complete 12 week course of lamisil.

## 2018-07-31 NOTE — Patient Instructions (Signed)
Please use albuterol every 6 hours for the next week. Complete chest x-ray on the first floor. Start prednisone taper and zpak. Call if new/worsening symptoms or if not improved in 2-3 days.

## 2018-08-07 ENCOUNTER — Encounter: Payer: Self-pay | Admitting: Medical Oncology

## 2018-08-07 ENCOUNTER — Other Ambulatory Visit: Payer: Self-pay | Admitting: Hematology and Oncology

## 2018-08-07 DIAGNOSIS — Z170 Estrogen receptor positive status [ER+]: Principal | ICD-10-CM

## 2018-08-07 DIAGNOSIS — C50412 Malignant neoplasm of upper-outer quadrant of left female breast: Secondary | ICD-10-CM

## 2018-08-07 NOTE — Progress Notes (Signed)
[name]: Follow-up Phase 1, 79-month I spoke with patient today for her 22- month on-study follow-up call (18 months since completion of palbociclib). Patient reports to be doing well. Patient confirms to be taking her anastrozole daily as directed. I inquired with patient if she has a mammogram scheduled, since last one was in February of 2019, patient states she is staying home and self-isolating due to the COVID-19 virus situation occurring and states will schedule one in the near future. Patient is asking for Dr. Lindi Adie to place an order for a future mammogram. Patient denies having any questions at this time. Patient thanked for her time and continued support of study and was encouraged to call Dr. Lindi Adie or myself with any questions or concerns she may have.  Message sent to MD for a mammogram order.  Adele Dan, RN, BSN [employer] 08/07/2018 11:45 AM

## 2018-08-09 ENCOUNTER — Ambulatory Visit: Payer: 59 | Admitting: Family

## 2018-08-10 ENCOUNTER — Other Ambulatory Visit: Payer: Self-pay | Admitting: Family

## 2018-08-22 ENCOUNTER — Emergency Department (HOSPITAL_COMMUNITY)
Admission: EM | Admit: 2018-08-22 | Discharge: 2018-08-22 | Disposition: A | Discharge status: Home / Self Care | Payer: 59 | Attending: Emergency Medicine | Admitting: Emergency Medicine

## 2018-08-22 ENCOUNTER — Emergency Department (HOSPITAL_BASED_OUTPATIENT_CLINIC_OR_DEPARTMENT_OTHER): Payer: 59

## 2018-08-22 ENCOUNTER — Encounter (HOSPITAL_COMMUNITY): Payer: Self-pay

## 2018-08-22 ENCOUNTER — Ambulatory Visit: Payer: Self-pay | Admitting: *Deleted

## 2018-08-22 ENCOUNTER — Other Ambulatory Visit: Payer: Self-pay

## 2018-08-22 DIAGNOSIS — Z96652 Presence of left artificial knee joint: Secondary | ICD-10-CM | POA: Diagnosis not present

## 2018-08-22 DIAGNOSIS — M7989 Other specified soft tissue disorders: Secondary | ICD-10-CM

## 2018-08-22 DIAGNOSIS — Z91040 Latex allergy status: Secondary | ICD-10-CM | POA: Insufficient documentation

## 2018-08-22 DIAGNOSIS — R2242 Localized swelling, mass and lump, left lower limb: Secondary | ICD-10-CM | POA: Insufficient documentation

## 2018-08-22 DIAGNOSIS — C50412 Malignant neoplasm of upper-outer quadrant of left female breast: Secondary | ICD-10-CM | POA: Insufficient documentation

## 2018-08-22 DIAGNOSIS — Z85850 Personal history of malignant neoplasm of thyroid: Secondary | ICD-10-CM | POA: Insufficient documentation

## 2018-08-22 DIAGNOSIS — R609 Edema, unspecified: Secondary | ICD-10-CM

## 2018-08-22 DIAGNOSIS — J449 Chronic obstructive pulmonary disease, unspecified: Secondary | ICD-10-CM | POA: Insufficient documentation

## 2018-08-22 NOTE — ED Notes (Signed)
US at bedside

## 2018-08-22 NOTE — ED Triage Notes (Addendum)
Pt c/o Left lower leg swelling and Pain since last night. Pt s/p Left knee replacement 6 mo ago. Pt unsure if she was placed on blood thinning medicine after surgery. Pt sts she was just placed on antihypertensive meds.

## 2018-08-22 NOTE — ED Notes (Signed)
Discharge instructions reviewed with patient. Patient verbalizes understanding. VSS.   

## 2018-08-22 NOTE — Telephone Encounter (Signed)
Just an FYI. Pt going to ED.

## 2018-08-22 NOTE — Progress Notes (Signed)
Lower extremity venous has been completed.   Preliminary results in CV Proc.   Michelle Kennedy 08/22/2018 9:53 AM

## 2018-08-22 NOTE — ED Provider Notes (Addendum)
[facility] DEPT Provider Note   CSN: [id number] Arrival date & time: 08/22/18  0908    History   Chief Complaint Chief Complaint  Patient presents with  . Leg Swelling    Left    HPI Michelle Kennedy is a 63 y.o. female.     The history is provided by the patient.  Leg Pain  Location:  Leg Time since incident:  2 days Lower extremity injury: left knee replacement several months ago with intermittent swelling but swelling worse over the last two days, hx of blood clots when had cancer but no longer on anticoagulation, in remission for 3 yrs from breast cancer.   Leg location:  L lower leg Pain details:    Quality:  Aching   Radiates to:  Does not radiate   Severity:  Mild   Onset quality:  Gradual   Duration:  2 days   Timing:  Constant   Progression:  Unchanged Chronicity:  New Relieved by:  Nothing Worsened by:  Nothing Associated symptoms: no back pain, no fever and no neck pain     Past Medical History:  Diagnosis Date  . Abnormally small mouth   . Achilles tendon disorder, right   . Anemia   . Anxiety   . Arthritis    hips and knees  . Asthma    daily inhaler, prn inhaler and neb.  . Asthma due to environmental allergies   . Breast cancer ([place]) 01/2014   left  . Cancer ([place])   . Chest pain   . COPD (chronic obstructive pulmonary disease) ([place])   . Dental bridge present    upper front and lower right  . Dental crowns present    x 3  . Depression   . Esophageal spasm    reports since her chemo and breast surgery  she developed esophageal spasms and reports this is in the past has casue her 02 to desat in the  70s , denies sycnope in relation to this , does report hx of vertigo as well   . Family history of anesthesia complication    twin brother aspirated and died on OR table, per pt.  . Fibromyalgia   . Gallbladder disease   . GERD (gastroesophageal reflux disease)   . H/O blood clots    had blood to  PICC line    . History of gastric ulcer    as a teenager  . History of seizure age 77   as a reaction to Penicillin - no seizures since  . History of stomach ulcers   . History of thyroid cancer    s/p thyroidectomy  . Hyperlipidemia   . Hypothyroidism   . Joint pain   . Left knee injury   . Liver disorder    seen when she had her hysterectomy , reports she was told it was  " small lesion" ; but asymptomatic   . Lymphedema    left arm  . Migraines   . Multiple food allergies   . Obesity   . OSA (obstructive sleep apnea) 02/16/2016  . Palpitations    reports no longer experiences  . Personal history of chemotherapy 2015  . Personal history of radiation therapy 2015   Left  . Prediabetes   . Rheumatoid arthritis ([place])   . SOB (shortness of breath)   . Swelling of both ankles   . Tinnitus   . UTI (lower urinary tract infection) 02/17/2014  . Vertigo  last episode  was 2 weeks ago   . Vitamin D deficiency   . Wears contact lenses    left eye only   . Wears hearing aid in both ears     Patient Active Problem List   Diagnosis Date Noted  . OA (osteoarthritis) of knee 12/03/2017  . Class 3 obesity without serious comorbidity with body mass index (BMI) of 40.0 to 44.9 in adult 01/01/2017  . Essential hypertension 10/18/2016  . Constipation 10/04/2016  . Vitamin D deficiency 10/04/2016  . Prediabetes 10/04/2016  . Morbid obesity ([place]) 09/20/2016  . Other fatigue 09/20/2016  . OSA (obstructive sleep apnea) 02/16/2016  . Right ankle pain 01/20/2016  . Strain of right Achilles tendon 11/30/2015  . Benign hypertension 10/11/2015  . Malignant neoplasm of thyroid gland ([place]) 10/11/2015  . De Quervain's tenosynovitis, bilateral 09/15/2015  . Right shoulder pain 09/15/2015  . Trigger finger, acquired 06/22/2015  . Left wrist pain 06/22/2015  . Thumb injury 06/14/2015  . Chemotherapy-induced peripheral neuropathy ([place]) 05/10/2015  . Postmenopausal osteoporosis 12/08/2014  . Preventative  health care 12/01/2014  . DVT (deep venous thrombosis) ([place]) 08/28/2014  . Lymphedema of arm 08/28/2014  . Mucositis due to chemotherapy 06/02/2014  . UTI symptoms 02/17/2014  . Menopausal syndrome (hot flashes) 12/31/2013  . Breast cancer of upper-outer quadrant of left female breast ([place]) 12/26/2013  . 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 Gingivitis 12/05/2013  . Seasonal allergies 09/24/2013  . Skin lesion 06/03/2013  . Menopause 06/02/2013  . Elevated blood-pressure reading without diagnosis of hypertension 03/05/2013  . Cough 07/24/2012  . Left knee injury 11/02/2011  . Gout 09/19/2011  . Hyperlipidemia 06/02/2011  . Migraine 06/02/2011  . Tinnitus of both ears 02/14/2011  . Left knee pain 09/23/2010  . FIBROMYALGIA 05/10/2010  . Asthma 03/22/2010  . 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 OVERWEIGHT 12/20/2009  . GERD 12/20/2009  . Depression 12/01/2009    Past Surgical History:  Procedure Laterality Date  . ABDOMINAL HYSTERECTOMY  2004   complete  . ACHILLES TENDON REPAIR Right   . APPENDECTOMY  2004  . BREAST EXCISIONAL BIOPSY    . BREAST LUMPECTOMY Left    2015  . BREAST LUMPECTOMY WITH NEEDLE LOCALIZATION AND AXILLARY SENTINEL LYMPH NODE BX Left 02/23/2014   Procedure: BREAST LUMPECTOMY WITH NEEDLE LOCALIZATION AND AXILLARY SENTINEL LYMPH NODE BIOPSY;  Surgeon: Excell Seltzer, MD;  Location: [facility];  Service: General;  Laterality: Left;  . BREAST SURGERY  2011   left breast biposy  . CHOLECYSTECTOMY  1990  . COLONOSCOPY W/ POLYPECTOMY  06/2009  . EYE SURGERY Right 2013   exc. warts from underneath eyelid  . INCONTINENCE SURGERY  2004  . KNEE ARTHROSCOPY Bilateral    x 6 each knee  . LIGAMENT REPAIR Right    thumb/wrist  . ORIF TOE FRACTURE Right    great toe  . PORTACATH PLACEMENT N/A 03/24/2014   Procedure: INSERTION PORT-A-CATH;  Surgeon: Excell Seltzer, MD;  Location: [facility];  Service:  General;  Laterality: N/A;; removed   . THYROIDECTOMY  2000  . TONSILLECTOMY AND ADENOIDECTOMY  2000  . TOTAL KNEE ARTHROPLASTY Left 12/03/2017   Procedure: LEFT TOTAL KNEE ARTHROPLASTY;  Surgeon: Gaynelle Arabian, MD;  Location: [facility];  Service: Orthopedics;  Laterality: Left;  . TUMOR EXCISION     from thoracic spine     OB History    Gravida  3   Para  3  Term  3   Preterm      AB      Living  3     SAB      TAB      Ectopic      Multiple      Live Births               Home Medications    Prior to Admission medications   Medication Sig Start Date End Date Taking? Authorizing Provider  albuterol (PROAIR HFA) 108 (90 Base) MCG/ACT inhaler INHALE 2 PUFFS INTO THE LUNGS EVERY 6 HOURS AS NEEDED Patient taking differently: Inhale 2 puffs into the lungs every 6 (six) hours as needed for wheezing or shortness of breath. INHALE 2 PUFFS INTO THE LUNGS EVERY 6 HOURS AS NEEDED 07/31/18   Debbrah Alar, NP  albuterol (PROVENTIL) (5 MG/ML) 0.5% nebulizer solution Take 0.5 mLs (2.5 mg total) by nebulization every 6 (six) hours as needed for wheezing or shortness of breath. 07/31/18   Debbrah Alar, NP  amLODipine (NORVASC) 10 MG tablet Take 1 tablet (10 mg total) by mouth daily. 05/30/18   Shelda Pal, DO  anastrozole (ARIMIDEX) 1 MG tablet Take 1 tablet (1 mg total) by mouth daily. 01/23/18   Nicholas Lose, MD  atorvastatin (LIPITOR) 20 MG tablet TAKE 1 TABLET BY MOUTH  DAILY Patient taking differently: Take 20 mg by mouth daily at 6 PM.  08/10/18   Debbrah Alar, NP  azithromycin (ZITHROMAX) 250 MG tablet Take 2 tabs by mouth today , then 1 tab once daily for 4 more days. Patient not taking: Reported on 08/22/2018 07/31/18   Debbrah Alar, NP  diphenhydrAMINE (BENADRYL) 25 MG tablet Take 1 tablet an hour before MRI.  Then take 1 tablet at bedtime as needed, per your usual regimen. Patient taking differently: Take 25 mg by mouth at bedtime as needed  for sleep. Take 1 tablet an hour before MRI.  Then take 1 tablet at bedtime as n 12/26/13   Brunetta Jeans, PA-C  DULoxetine (CYMBALTA) 60 MG capsule TAKE 1 CAPSULE BY MOUTH  DAILY Patient taking differently: Take 60 mg by mouth daily.  06/11/17   Debbrah Alar, NP  EPINEPHrine 0.3 mg/0.3 mL IJ SOAJ injection Inject 0.3 mLs (0.3 mg total) into the muscle once. 09/24/13   Debbrah Alar, NP  Fluticasone-Salmeterol [facility] INHUB) 250-50 MCG/DOSE AEPB Inhale 1 puff into the lungs 2 (two) times daily. 07/05/18   Debbrah Alar, NP  levothyroxine (SYNTHROID, LEVOTHROID) 200 MCG tablet Take 1 tablet (200 mcg total) by mouth daily before breakfast. 05/31/18   Debbrah Alar, NP  meloxicam (MOBIC) 7.5 MG tablet TAKE 1 TABLET BY MOUTH  DAILY Patient taking differently: Take 7.5 mg by mouth daily.  04/09/18   Debbrah Alar, NP  MYRBETRIQ 50 MG TB24 tablet TAKE 1 TABLET BY MOUTH  DAILY Patient taking differently: Take 50 mg by mouth daily.  04/29/18   Debbrah Alar, NP  predniSONE (DELTASONE) 10 MG tablet 4 tabs by mouth once daily for 2 days, then 3 tabs daily x 2 days, then 2 tabs daily x 2 days, then 1 tab daily x 2 days Patient not taking: Reported on 08/22/2018 07/31/18   Debbrah Alar, NP  terbinafine (LAMISIL) 250 MG tablet Take 1 tablet (250 mg total) by mouth daily. 07/31/18   Debbrah Alar, NP    Family History Family History  Problem Relation Age of Onset  . Alcohol abuse Mother   . Arthritis Mother   .  Hypertension Mother   . Bipolar disorder Mother   . Breast cancer Mother 33       unconfirmed  . Lung cancer Mother 110       smoker  . Hyperlipidemia Mother   . Stroke Mother   . Cancer Mother   . Depression Mother   . Anxiety disorder Mother   . Alcoholism Mother   . Drug abuse Mother   . Eating disorder Mother   . Obesity Mother   . Alcohol abuse Father   . Cancer Father        lung  . Hyperlipidemia Father   . Kidney disease Father   .  Diabetes Father   . Hypertension Father   . Cystic kidney disease Father   . Thyroid disease Father   . Liver disease Father   . Alcoholism Father   . Arthritis Maternal Grandmother   . Diabetes Paternal Grandmother   . Thyroid cancer Sister 74       type?; currently 14  . Other Sister        ovarian tumor @ 36; TAH/BSO  . Breast cancer Sister   . Thyroid cancer Brother        dx 72s; currently 67  . Breast cancer Maternal Aunt        dx 28s; deceased 76  . Thyroid cancer Paternal Aunt        All 3 paternal aunts with thyroid ca in 30s/40s  . Lung cancer Paternal Aunt        2 of 3 paternal aunts with lung cancer    Social History Social History   Tobacco Use  . Smoking status: Never Smoker  . Smokeless tobacco: Never Used  Substance Use Topics  . Alcohol use: No    Alcohol/week: 0.0 standard drinks  . Drug use: No     Allergies   Bee venom; Contrast media [iodinated diagnostic agents]; Iodine; Latex; Penicillins; Shellfish allergy; Aspirin; Erythromycin; Lidocaine; Symbicort [budesonide-formoterol fumarate]; Betadine [povidone iodine]; Codeine; Compazine [prochlorperazine maleate]; Pentazocine lactate; and Sulfonamide derivatives   Review of Systems Review of Systems  Constitutional: Negative for chills and fever.  HENT: Negative for ear pain and sore throat.   Eyes: Negative for pain and visual disturbance.  Respiratory: Negative for cough and shortness of breath.   Cardiovascular: Negative for chest pain and palpitations.  Gastrointestinal: Negative for abdominal pain and vomiting.  Genitourinary: Negative for dysuria and hematuria.  Musculoskeletal: Negative for arthralgias, back pain, joint swelling, myalgias and neck pain.       Left lower leg pain  Skin: Negative for color change and rash.  Neurological: Negative for seizures and syncope.  All other systems reviewed and are negative.    Physical Exam Updated Vital Signs BP (!) 146/92 (BP Location:  Right Arm)   Pulse 72   Temp 98.1 F (36.7 C) (Oral)   Resp 20   Ht 5' 7.5" (1.715 m)   Wt 126.6 kg   SpO2 97%   BMI 43.05 kg/m   Physical Exam Vitals signs and nursing note reviewed.  Constitutional:      General: She is not in acute distress.    Appearance: She is well-developed. She is not ill-appearing.  HENT:     Head: Normocephalic and atraumatic.  Eyes:     Extraocular Movements: Extraocular movements intact.     Conjunctiva/sclera: Conjunctivae normal.     Pupils: Pupils are equal, round, and reactive to light.  Neck:  Musculoskeletal: Normal range of motion and neck supple.  Cardiovascular:     Rate and Rhythm: Normal rate and regular rhythm.     Pulses: Normal pulses.     Heart sounds: Normal heart sounds. No murmur.  Pulmonary:     Effort: Pulmonary effort is normal. No respiratory distress.     Breath sounds: Normal breath sounds.  Abdominal:     Palpations: Abdomen is soft.     Tenderness: There is no abdominal tenderness.  Musculoskeletal: Normal range of motion.        General: Tenderness present.     Right lower leg: Edema (trace) present.     Left lower leg: Edema (1+) present.  Skin:    General: Skin is warm and dry.  Neurological:     General: No focal deficit present.     Mental Status: She is alert.      ED Treatments / Results  Labs (all labs ordered are listed, but only abnormal results are displayed) Labs Reviewed - No data to display  EKG None  Radiology Vas [place] Lower Extremity Venous (dvt) (only [facility])  Result Date: 08/22/2018  Lower Venous Study Indications: Edema.  Limitations: Body habitus. Performing Technologist: [name] RVS  Examination Guidelines: A complete evaluation includes B-mode imaging, spectral Doppler, color Doppler, and power Doppler as needed of all accessible portions of each vessel. Bilateral testing is considered an integral part of a complete examination. Limited examinations for reoccurring indications  may be performed as noted.  Right Venous Findings: +---+---------------+---------+-----------+----------+-------+    CompressibilityPhasicitySpontaneityPropertiesSummary +---+---------------+---------+-----------+----------+-------+ CFVFull           Yes      Yes                          +---+---------------+---------+-----------+----------+-------+  Left Venous Findings: +---------+---------------+---------+-----------+----------+--------------+          CompressibilityPhasicitySpontaneityPropertiesSummary        +---------+---------------+---------+-----------+----------+--------------+ CFV      Full           Yes      Yes                                 +---------+---------------+---------+-----------+----------+--------------+ SFJ      Full                                                        +---------+---------------+---------+-----------+----------+--------------+ FV Prox  Full                                                        +---------+---------------+---------+-----------+----------+--------------+ FV Mid   Full                                                        +---------+---------------+---------+-----------+----------+--------------+ FV DistalFull                                                        +---------+---------------+---------+-----------+----------+--------------+  PFV      Full                                                        +---------+---------------+---------+-----------+----------+--------------+ POP      Full           Yes      Yes                                 +---------+---------------+---------+-----------+----------+--------------+ PTV      Full                                                        +---------+---------------+---------+-----------+----------+--------------+ PERO                                                  Not visualized  +---------+---------------+---------+-----------+----------+--------------+    Summary: Right: No evidence of common femoral vein obstruction. Left: There is no evidence of deep vein thrombosis in the lower extremity. However, portions of this examination were limited- see technologist comments above. No cystic structure found in the popliteal fossa.  *See table(s) above for measurements and observations.    Preliminary     Procedures Procedures (including critical care time)  Medications Ordered in ED Medications - No data to display   Initial Impression / Assessment and Plan / ED Course  I have reviewed the triage vital signs and the nursing notes.  Pertinent labs & imaging results that were available during my care of the patient were reviewed by me and considered in my medical decision making (see chart for details).     Michelle Kennedy is a 63 year old female with history of breast cancer, status post recent left knee replacement who presents to the ED with left lower leg swelling.  Patient with unremarkable vitals.  No fever.  Patient with pain and swelling to the left lower leg below the knee for the last 2 days.  Has had intermittent swelling in this area since her surgery.  Has a history of blood clots but no longer on blood thinners.  Had a blood clot while she is undergoing therapy for cancer.  Currently in remission.  Patient has good pulses throughout.  No redness, no warmth, no concern for cellulitis.  No concern for septic joint.  Patient has normal range of motion of both lower legs.  She does not have any respiratory symptoms.  Breathing well on room air.  Will get an ultrasound to rule out DVT.  Will re-evaluate.  Either DVT or likely postsurgical intermittent swelling/inflammatory swelling.  DVT study was negative.  The peroneal vein was not visualized however this likely pertains to the negative study as there is no dilated noncompressible vein.  Suspect that patient likely  just has inflammation from increased activity recently.  Patient has good pulses.  No signs of infection.  Recommend elevation of the limb when able.  Did discuss that she should return to the  ED for repeat ultrasound if swelling is continuously getting worse.  However she states that swelling has improved since last night.  Discharged from ED in good condition.  This chart was dictated using voice recognition software.  Despite best efforts to proofread,  errors can occur which can change the documentation meaning.    Final Clinical Impressions(s) / ED Diagnoses   Final diagnoses:  Leg swelling    ED Discharge Orders    None       Lennice Sites, DO 08/22/18 [place], Molleigh Huot, DO 08/22/18 1002

## 2018-08-22 NOTE — Telephone Encounter (Signed)
Pt called stating that her total left left is swollen twice the size of her right leg and her foot is a different color; she has taken an aspirin; she also [ut on a TED hose but took it off because it was painful; the pt states the swelling initially started since knee surgery 6 months ago; it became severe on 08/21/2018; the pt says that she called her orthopedic MD and was told to go to the ED;  recommendations made per nurse triage protocol; she verbalizes understanding and will proceed to the ED; the pt normally sees Debbrah Alar, [facility]; will route to office for notification.  Reason for Disposition . Entire foot is cool or blue in comparison to other side  Answer Assessment - Initial Assessment Questions 1. ONSET: "When did the swelling start?" (e.g., minutes, hours, days)     08/21/2018 2. LOCATION: "What part of the leg is swollen?"  "Are both legs swollen or just one leg?"   left 3. SEVERITY: "How bad is the swelling?" (e.g., localized; mild, moderate, severe)  - Localized - small area of swelling localized to one leg  - MILD pedal edema - swelling limited to foot and ankle, pitting edema < 1/4 inch (6 mm) deep, rest and elevation eliminate most or all swelling  - MODERATE edema - swelling of lower leg to knee, pitting edema > 1/4 inch (6 mm) deep, rest and elevation only partially reduce swelling  - SEVERE edema - swelling extends above knee, facial or hand swelling present     severe 4. REDNESS: "Does the swelling look red or infected?"     no 5. PAIN: "Is the swelling painful to touch?" If so, ask: "How painful is it?"   (Scale 1-10; mild, moderate or severe)  4-5 out of 10; worse when standing 7-8 out of 10 6. FEVER: "Do you have a fever?" If so, ask: "What is it, how was it measured, and when did it start?"      no 7. CAUSE: "What do you think is causing the leg swelling?"     Blood clot 8. MEDICAL HISTORY: "Do you have a history of heart failure, kidney disease, liver  failure, or cancer?"     Breast cancer 3 years ago;  blood clots, hypertension 9. RECURRENT SYMPTOM: "Have you had leg swelling before?" If so, ask: "When was the last time?" "What happened that time?"     Pt has been having mild swelling since knee surgery 6 months ago 10. OTHER SYMPTOMS: "Do you have any other symptoms?" (e.g., chest pain, difficulty breathing)       Foot is blue 11. PREGNANCY: "Is there any chance you are pregnant?" "When was your last menstrual period?"     No hysterectomy  Protocols used: LEG SWELLING AND EDEMA-A-AH

## 2018-08-22 NOTE — Discharge Instructions (Addendum)
Keep your leg elevated as we discussed.  If symptoms get worse you may need repeat ultrasound as one vein was not visualized on your ultrasound however overall reassuring findings.  Do not believe you have a current DVT at this time.

## 2018-09-13 ENCOUNTER — Telehealth: Payer: Self-pay | Admitting: Family

## 2018-09-13 MED ORDER — LEVOTHYROXINE SODIUM 200 MCG PO TABS: 200.0000 ug | ORAL_TABLET | Freq: Every day | ORAL | 2 refills | Status: DC

## 2018-09-13 NOTE — Telephone Encounter (Signed)
Patients husband said if unable to refill please call him to discuss. [phone]

## 2018-09-23 NOTE — Telephone Encounter (Signed)
Pts husband called stating pharmacy had not received this medication refill request. Please advise.

## 2018-09-23 NOTE — Telephone Encounter (Signed)
Spoke with pharmacist; Levothyroxine rx from 09/13/18 has been filled and is ready for pick up. Attempted to notify pt and left a detailed message on her voicemail that Rx is ready and to let us know if there is a different medication she was needing to know about.

## 2018-09-25 ENCOUNTER — Telehealth: Payer: Self-pay | Admitting: *Deleted

## 2018-09-25 NOTE — Telephone Encounter (Signed)
Information given to patient.   

## 2018-09-25 NOTE — Telephone Encounter (Signed)
Pt states she and spouse are having some difficulties and are contemplating divorce. She is asking for recommendations for emotional support for herself. She thinks that her spouse has a therapist that he is seeing. She would like to know who you recommend?  Copied from [place] [phone]. Topic: General - Other >> Sep 25, 2018  9:18 AM Celene Kras A wrote: Reason for CRM: Pt called and is requesting referral for a psychiatrist. Please advise.

## 2018-09-25 NOTE — Telephone Encounter (Signed)
Please let her know that I would recommend [facility]. She can call [facility] at [phone]

## 2018-10-02 ENCOUNTER — Ambulatory Visit (INDEPENDENT_AMBULATORY_CARE_PROVIDER_SITE_OTHER): Payer: 59 | Admitting: Professional

## 2018-10-02 DIAGNOSIS — F4323 Adjustment disorder with mixed anxiety and depressed mood: Secondary | ICD-10-CM | POA: Diagnosis not present

## 2018-10-04 ENCOUNTER — Ambulatory Visit: Payer: 59 | Admitting: Family

## 2018-10-07 ENCOUNTER — Ambulatory Visit (INDEPENDENT_AMBULATORY_CARE_PROVIDER_SITE_OTHER): Payer: 59 | Admitting: Professional

## 2018-10-07 DIAGNOSIS — F4323 Adjustment disorder with mixed anxiety and depressed mood: Secondary | ICD-10-CM | POA: Diagnosis not present

## 2018-10-16 ENCOUNTER — Ambulatory Visit (INDEPENDENT_AMBULATORY_CARE_PROVIDER_SITE_OTHER): Payer: 59 | Admitting: Professional

## 2018-10-16 DIAGNOSIS — F4323 Adjustment disorder with mixed anxiety and depressed mood: Secondary | ICD-10-CM | POA: Diagnosis not present

## 2018-10-19 ENCOUNTER — Other Ambulatory Visit: Payer: Self-pay | Admitting: Family

## 2018-10-23 ENCOUNTER — Ambulatory Visit (INDEPENDENT_AMBULATORY_CARE_PROVIDER_SITE_OTHER): Payer: 59 | Admitting: Professional

## 2018-10-23 DIAGNOSIS — F4323 Adjustment disorder with mixed anxiety and depressed mood: Secondary | ICD-10-CM | POA: Diagnosis not present

## 2018-11-06 ENCOUNTER — Ambulatory Visit (INDEPENDENT_AMBULATORY_CARE_PROVIDER_SITE_OTHER): Payer: 59 | Admitting: Professional

## 2018-11-06 DIAGNOSIS — F4323 Adjustment disorder with mixed anxiety and depressed mood: Secondary | ICD-10-CM | POA: Diagnosis not present

## 2018-11-07 ENCOUNTER — Telehealth: Payer: Self-pay | Admitting: Medical Oncology

## 2018-11-07 NOTE — Telephone Encounter (Signed)
[name]: Patient Letter LVMOM: call to patient x2. Message left on second call, with patient to call Dr. Lindi Adie or myself with questions.  Call to patient to inform her that research study experts have conducted a review of the study and have determined that there was very little chance that the addition of Palbociclib to endocrine therapy significantly reduced cancer recurrence, compared to endocrine therapy alone. Patient also informed that there was no sign of unexpected toxicity or harm to people taking Palbociclib with endocrine therapy. Patient had already completed Palbociclib and is currently in the follow-up phase of study. Patient was informed that a letter will be sent out via mail, this Friday the 19th, as well, informing her of the same. Patient was thanked for her time and her support of study and was encouraged to call Dr. Lindi Adie or myself with any questions or concerns. Maxwell Marion, RN, BSN, [facility] Clinical Research 11/07/2018 1:13 PM

## 2018-11-20 ENCOUNTER — Encounter: Payer: Self-pay | Admitting: Family

## 2018-11-27 ENCOUNTER — Ambulatory Visit: Payer: Self-pay | Admitting: Professional

## 2018-12-11 ENCOUNTER — Ambulatory Visit: Payer: 59 | Admitting: Professional

## 2019-01-12 ENCOUNTER — Encounter: Payer: Self-pay | Admitting: Family

## 2019-01-13 ENCOUNTER — Ambulatory Visit (INDEPENDENT_AMBULATORY_CARE_PROVIDER_SITE_OTHER): Payer: 59 | Admitting: Family

## 2019-01-13 VITALS — BP 157/74 | HR 63 | Temp 98.0°F | Resp 16

## 2019-01-13 DIAGNOSIS — G43519 Persistent migraine aura without cerebral infarction, intractable, without status migrainosus: Secondary | ICD-10-CM | POA: Diagnosis not present

## 2019-01-13 MED ORDER — KETOROLAC TROMETHAMINE 60 MG/2ML IM SOLN
60.0000 mg | Freq: Once | INTRAMUSCULAR | Status: AC
  Administered 2019-01-13: 12:00:00 60 mg via INTRAMUSCULAR

## 2019-01-13 MED ORDER — ONDANSETRON HCL 4 MG PO TABS
4.0000 mg | ORAL_TABLET | Freq: Once | ORAL | Status: AC
  Administered 2019-01-13: 4 mg via ORAL

## 2019-01-13 MED ORDER — SUMATRIPTAN SUCCINATE 50 MG PO TABS: ORAL_TABLET | ORAL | 2 refills | Status: DC

## 2019-01-13 MED ORDER — ONDANSETRON HCL 4 MG PO TABS: 4.0000 mg | ORAL_TABLET | Freq: Three times a day (TID) | ORAL | 0 refills | Status: DC | PRN

## 2019-01-13 NOTE — Progress Notes (Signed)
Virtual Visit via Video Note  I connected with Michelle Kennedy on 01/13/19 at 11:20 AM EDT by a video enabled telemedicine application and verified that I am speaking with the correct person using two identifiers.  Location: Patient: home Provider: home   I discussed the limitations of evaluation and management by telemedicine and the availability of in person appointments. The patient expressed understanding and agreed to proceed.  History of Present Illness:   Patient is a 63 yr old female who presents today with chief complaint of migraine. She reports that she has had a migraine since 01/08/19.  HA is located behind her left eye.  She had some old Zomig on hand and tried that x 1 without relief.  She has also tried tylenol without relief. She reports associated nausea. "I feel miserable." Migraine- since Wednesday- behind her left eye. Tried zomig, tylenol. + nausea, nothing has helped so far.    Past Medical History:  Diagnosis Date  . Abnormally small mouth   . Achilles tendon disorder, right   . Anemia   . Anxiety   . Arthritis    hips and knees  . Asthma    daily inhaler, prn inhaler and neb.  . Asthma due to environmental allergies   . Breast cancer ([place]) 01/2014   left  . Cancer ([place])   . Chest pain   . COPD (chronic obstructive pulmonary disease) ([place])   . Dental bridge present    upper front and lower right  . Dental crowns present    x 3  . Depression   . Esophageal spasm    reports since her chemo and breast surgery  she developed esophageal spasms and reports this is in the past has casue her 02 to desat in the  70s , denies sycnope in relation to this , does report hx of vertigo as well   . Family history of anesthesia complication    twin brother aspirated and died on OR table, per pt.  . Fibromyalgia   . Gallbladder disease   . GERD (gastroesophageal reflux disease)   . H/O blood clots    had blood to  PICC line   . History of gastric ulcer    as a  teenager  . History of seizure age 75   as a reaction to Penicillin - no seizures since  . History of stomach ulcers   . History of thyroid cancer    s/p thyroidectomy  . Hyperlipidemia   . Hypothyroidism   . Joint pain   . Left knee injury   . Liver disorder    seen when she had her hysterectomy , reports she was told it was  " small lesion" ; but asymptomatic   . Lymphedema    left arm  . Migraines   . Multiple food allergies   . Obesity   . OSA (obstructive sleep apnea) 02/16/2016  . Palpitations    reports no longer experiences  . Personal history of chemotherapy 2015  . Personal history of radiation therapy 2015   Left  . Prediabetes   . Rheumatoid arthritis ([place])   . SOB (shortness of breath)   . Swelling of both ankles   . Tinnitus   . UTI (lower urinary tract infection) 02/17/2014  . Vertigo    last episode  was 2 weeks ago   . Vitamin D deficiency   . Wears contact lenses    left eye only   . Wears hearing aid  in both ears      Social History   Socioeconomic History  . Marital status: Married    Spouse name: Not on file  . Number of children: Not on file  . Years of education: Not on file  . Highest education level: Not on file  Occupational History  . 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 Financial resource strain: Not on file  . Food insecurity    Worry: Not on file    Inability: Not on file  . Transportation needs    Medical: Not on file    Non-medical: Not on file  Tobacco Use  . Smoking status: Never Smoker  . Smokeless tobacco: Never Used  Substance and Sexual Activity  . Alcohol use: No    Alcohol/week: 0.0 standard drinks  . Drug use: No  . Sexual activity: Not Currently    Partners: Male    Comment: menarche age 3, P 2, first birth age 10, menopause age 49, Premarin x 10 yrs  Lifestyle  . Physical activity    Days per week: Not on file    Minutes per session: Not on file  . Stress: Not on file   Relationships  . Social Herbalist on phone: Not on file    Gets together: Not on file    Attends religious service: Not on file    Active member of club or organization: Not on file    Attends meetings of clubs or organizations: Not on file    Relationship status: Not on file  . Intimate partner violence    Fear of current or ex partner: Not on file    Emotionally abused: Not on file    Physically abused: Not on file    Forced sexual activity: Not on file  Other Topics Concern  . Not on file  Social History Narrative   Regular exercise: yes       Past Surgical History:  Procedure Laterality Date  . ABDOMINAL HYSTERECTOMY  2004   complete  . ACHILLES TENDON REPAIR Right   . APPENDECTOMY  2004  . BREAST EXCISIONAL BIOPSY    . BREAST LUMPECTOMY Left    2015  . BREAST LUMPECTOMY WITH NEEDLE LOCALIZATION AND AXILLARY SENTINEL LYMPH NODE BX Left 02/23/2014   Procedure: BREAST LUMPECTOMY WITH NEEDLE LOCALIZATION AND AXILLARY SENTINEL LYMPH NODE BIOPSY;  Surgeon: Excell Seltzer, MD;  Location: [place] [facility];  Service: General;  Laterality: Left;  . BREAST SURGERY  2011   left breast biposy  . CHOLECYSTECTOMY  1990  . COLONOSCOPY W/ POLYPECTOMY  06/2009  . EYE SURGERY Right 2013   exc. warts from underneath eyelid  . INCONTINENCE SURGERY  2004  . KNEE ARTHROSCOPY Bilateral    x 6 each knee  . LIGAMENT REPAIR Right    thumb/wrist  . ORIF TOE FRACTURE Right    great toe  . PORTACATH PLACEMENT N/A 03/24/2014   Procedure: INSERTION PORT-A-CATH;  Surgeon: Excell Seltzer, MD;  Location: [facility];  Service: General;  Laterality: N/A;; removed   . THYROIDECTOMY  2000  . TONSILLECTOMY AND ADENOIDECTOMY  2000  . TOTAL KNEE ARTHROPLASTY Left 12/03/2017   Procedure: LEFT TOTAL KNEE ARTHROPLASTY;  Surgeon: Gaynelle Arabian, MD;  Location: [facility];  Service: Orthopedics;  Laterality: Left;  . TUMOR EXCISION     from thoracic spine    Family  History  Problem Relation Age of Onset  .  Alcohol abuse Mother   . Arthritis Mother   . Hypertension Mother   . Bipolar disorder Mother   . Breast cancer Mother 54       unconfirmed  . Lung cancer Mother 12       smoker  . Hyperlipidemia Mother   . Stroke Mother   . Cancer Mother   . Depression Mother   . Anxiety disorder Mother   . Alcoholism Mother   . Drug abuse Mother   . Eating disorder Mother   . Obesity Mother   . Alcohol abuse Father   . Cancer Father        lung  . Hyperlipidemia Father   . Kidney disease Father   . Diabetes Father   . Hypertension Father   . Cystic kidney disease Father   . Thyroid disease Father   . Liver disease Father   . Alcoholism Father   . Arthritis Maternal Grandmother   . Diabetes Paternal Grandmother   . Thyroid cancer Sister 52       type?; currently 35  . Other Sister        ovarian tumor @ 28; TAH/BSO  . Breast cancer Sister   . Thyroid cancer Brother        dx 95s; currently 40  . Breast cancer Maternal Aunt        dx 17s; deceased 60  . Thyroid cancer Paternal Aunt        All 3 paternal aunts with thyroid ca in 30s/40s  . Lung cancer Paternal Aunt        2 of 3 paternal aunts with lung cancer    Allergies  Allergen Reactions  . Bee Venom Anaphylaxis  . Contrast Media [Iodinated Diagnostic Agents] Shortness Of Breath  . Iodine Other (See Comments)    CARDIAC ARREST  . Latex Anaphylaxis and Rash  . Penicillins Shortness Of Breath, Rash and Other (See Comments)    SEIZURE Did it involve swelling of the face/tongue/throat, SOB, or low BP? no Did it involve sudden or severe rash/hives, skin peeling, or any reaction on the inside of your mouth or nose? yes Did you need to seek medical attention at a hospital or doctor's office? yes When did it last happen?2010 If all above answers are "NO", may proceed with cephalosporin use.   . Shellfish Allergy Shortness Of Breath and Rash  . Aspirin Rash and Other (See  Comments)    GI UPSET  . Erythromycin Swelling and Rash    SWELLING OF JOINTS  . Lidocaine Swelling    SWELLING OF MOUTH AND THROAT  . Symbicort [Budesonide-Formoterol Fumarate] Other (See Comments)    BURNING OF TONGUE AND LIPS  . Betadine [Povidone Iodine]     Rash. Breathing problems.   . Codeine Rash  . Compazine [Prochlorperazine Maleate] Rash    Rash on face,chest, arms, back  . Pentazocine Lactate Rash  . Sulfonamide Derivatives Rash    Current Outpatient Medications on File Prior to Visit  Medication Sig Dispense Refill  . albuterol (PROAIR HFA) 108 (90 Base) MCG/ACT inhaler INHALE 2 PUFFS INTO THE LUNGS EVERY 6 HOURS AS NEEDED (Patient taking differently: Inhale 2 puffs into the lungs every 6 (six) hours as needed for wheezing or shortness of breath. INHALE 2 PUFFS INTO THE LUNGS EVERY 6 HOURS AS NEEDED) 3 Inhaler 1  . albuterol (PROVENTIL) (5 MG/ML) 0.5% nebulizer solution Take 0.5 mLs (2.5 mg total) by nebulization every 6 (six) hours as needed for  wheezing or shortness of breath. 40 mL 3  . amLODipine (NORVASC) 10 MG tablet Take 1 tablet (10 mg total) by mouth daily. 90 tablet 3  . anastrozole (ARIMIDEX) 1 MG tablet Take 1 tablet (1 mg total) by mouth daily. 90 tablet 3  . atorvastatin (LIPITOR) 20 MG tablet TAKE 1 TABLET BY MOUTH  DAILY (Patient taking differently: Take 20 mg by mouth daily at 6 PM. ) 90 tablet 1  . diphenhydrAMINE (BENADRYL) 25 MG tablet Take 1 tablet an hour before MRI.  Then take 1 tablet at bedtime as needed, per your usual regimen. (Patient taking differently: Take 25 mg by mouth at bedtime as needed for sleep. Take 1 tablet an hour before MRI.  Then take 1 tablet at bedtime as n) 30 tablet 0  . DULoxetine (CYMBALTA) 60 MG capsule TAKE 1 CAPSULE BY MOUTH  DAILY (Patient taking differently: Take 60 mg by mouth daily. ) 90 capsule 1  . EPINEPHrine 0.3 mg/0.3 mL IJ SOAJ injection Inject 0.3 mLs (0.3 mg total) into the muscle once. 2 Device 0  .  Fluticasone-Salmeterol (WIXELA INHUB) 250-50 MCG/DOSE AEPB Inhale 1 puff into the lungs 2 (two) times daily. 180 each 3  . levothyroxine (SYNTHROID) 200 MCG tablet Take 1 tablet (200 mcg total) by mouth daily before breakfast. 90 tablet 2  . meloxicam (MOBIC) 7.5 MG tablet TAKE 1 TABLET BY MOUTH  DAILY 90 tablet 1  . MYRBETRIQ 50 MG TB24 tablet TAKE 1 TABLET BY MOUTH  DAILY 90 tablet 1  . terbinafine (LAMISIL) 250 MG tablet Take 1 tablet (250 mg total) by mouth daily. 30 tablet 0   No current facility-administered medications on file prior to visit.     BP (!) 157/74 (BP Location: Right Arm, Patient Position: Sitting, Cuff Size: Large)   Pulse 63   Temp 98 F (36.7 C) (Oral)   Resp 16   SpO2 98%    Observations/Objective:   Gen: Awake, alert, no acute distress Resp: Breathing is even and non-labored Psych: calm/pleasant demeanor Neuro: Alert and Oriented x 3, + facial symmetry, speech is clear.   Assessment and Plan:  Migraine- will rx with imitrex, zofran prn for home.  In the meantime, she will drive over to the office to receive Toradol 60mg  IM and zofran 4mg  PO.  She reports that she has taken toradol in the past and tolerated without any allergic reaction.    BP noted to be slightly elevated. Will see how she is doing at her follow up in 1 month when she is no longer in pain.   Follow Up Instructions:    I discussed the assessment and treatment plan with the patient. The patient was provided an opportunity to ask questions and all were answered. The patient agreed with the plan and demonstrated an understanding of the instructions.   The patient was advised to call back or seek an in-person evaluation if the symptoms worsen or if the condition fails to improve as anticipated.  Nance Pear, NP

## 2019-01-15 NOTE — Assessment & Plan Note (Deleted)
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 M0 stage IIB Adjuvant chemotherapy Completed 4 cycles of dose dense Adriamycin and Cytoxan. Followed by Abraxane X 12 completed 08/18/14, status post radiation completed June 2016, started anastrozole 1 mg daily 11/20/2014 (also enrolled on [name] clinical trial randomized to [facility]) completed 2 years9/08/2016  Breast Cancer Surveillance: 1. Breast exam:09/04/2019Normal except for scar tissue related to prior surgery and radiation 2. Mammogram 2/11/2019cegory B breast density. No mammographic abnormalities postsurgical changes. Annual mammogram will need to be obtained.  RTCone yearfor labs and follow-up

## 2019-01-21 ENCOUNTER — Telehealth: Payer: Self-pay | Admitting: Hematology and Oncology

## 2019-01-21 NOTE — Telephone Encounter (Signed)
Confirmed 11/9 f/u with patient. Rescheduled from 9/2 - date per patient.

## 2019-01-22 ENCOUNTER — Telehealth: Payer: Self-pay | Admitting: Medical Oncology

## 2019-01-22 ENCOUNTER — Inpatient Hospital Stay: Payer: 59 | Admitting: Hematology and Oncology

## 2019-01-22 NOTE — Telephone Encounter (Signed)
[name]: 71-month follow-up Patient was scheduled for a follow-up appt this morning and had rescheduled to November 9th. I was to see patient for study follow-up assessments.  LVMOM with patient asking her to return call at her earliest convenience. Patient thanked. My contact information provided.  Maxwell Marion, RN, BSN, [facility] Clinical Research 01/22/2019 1:47 PM

## 2019-01-28 NOTE — Telephone Encounter (Signed)
[name] Patient returned call this morning, regarding my message left for her last week. Patient states that she is doing well. Patient informed me that she had to change her office visit appointment with Dr. Lindi Adie due to transportation issues. Patient rescheduled her visit to November 9th, 2020. Patient confirms receipt of study letter mailed to her in June regarding the independent review of [place]. Patient states she has no questions regarding the letter and/or study at this time.  Patient has not started any new anti-cancer medications and has no study related AE's at this time.  Patient thanked for her time and continued support of study and was encouraged to call Dr. Lindi Adie or myself with any questions or concerns she may have.  Maxwell Marion, RN, BSN, [facility] Clinical Research 01/28/2019 11:33 AM

## 2019-01-29 ENCOUNTER — Other Ambulatory Visit: Payer: Self-pay

## 2019-01-29 ENCOUNTER — Telehealth: Payer: Self-pay | Admitting: Family

## 2019-01-29 ENCOUNTER — Ambulatory Visit (INDEPENDENT_AMBULATORY_CARE_PROVIDER_SITE_OTHER): Payer: 59 | Admitting: Family

## 2019-01-29 VITALS — BP 141/82 | HR 85 | Temp 98.7°F | Wt 278.0 lb

## 2019-01-29 DIAGNOSIS — R739 Hyperglycemia, unspecified: Secondary | ICD-10-CM

## 2019-01-29 DIAGNOSIS — E559 Vitamin D deficiency, unspecified: Secondary | ICD-10-CM | POA: Diagnosis not present

## 2019-01-29 DIAGNOSIS — I10 Essential (primary) hypertension: Secondary | ICD-10-CM

## 2019-01-29 DIAGNOSIS — R635 Abnormal weight gain: Secondary | ICD-10-CM

## 2019-01-29 DIAGNOSIS — E785 Hyperlipidemia, unspecified: Secondary | ICD-10-CM

## 2019-01-29 DIAGNOSIS — Z853 Personal history of malignant neoplasm of breast: Secondary | ICD-10-CM

## 2019-01-29 DIAGNOSIS — E039 Hypothyroidism, unspecified: Secondary | ICD-10-CM

## 2019-01-29 DIAGNOSIS — Z85850 Personal history of malignant neoplasm of thyroid: Secondary | ICD-10-CM

## 2019-01-29 NOTE — Progress Notes (Signed)
Virtual Visit via Video Note  I connected with Michelle Kennedy on 01/29/19 at 10:40 AM EDT by a video enabled telemedicine application and verified that I am speaking with the correct person using two identifiers.  Location: Patient: home Provider: work   I discussed the limitations of evaluation and management by telemedicine and the availability of in person appointments. The patient expressed understanding and agreed to proceed.  History of Present Illness:  Patient is a 63 yr old female who presents today for follow up.  HTN-current blood pressure medication includes amlodipine 10 mg.  BP Readings from Last 3 Encounters:  01/29/19 (!) 141/82  01/13/19 (!) 157/74  08/22/18 (!) 146/92   Migraines-  she is maintained on as needed Imitrex. Has had a few migraines since we saw her last but notes that she has had improvement with  Use of prn imitrex.   Onychomycosis-completed lamisil. Notes complete resolution.  Reports a 20 pound weight gain following her knee surgery.  Able to walk 5000 steps  Wt Readings from Last 3 Encounters:  01/29/19 278 lb (126.1 kg)  08/22/18 279 lb (126.6 kg)  07/31/18 281 lb 9.6 oz (127.7 kg)    Hyperlipidemia-maintained on atorvastatin 20 mg. Reports good compliance.   Lab Results  Component Value Date   CHOL 174 02/26/2018   HDL 55.10 02/26/2018   LDLCALC 89 02/26/2018   LDLDIRECT 102.0 07/08/2014   TRIG 148.0 02/26/2018   CHOLHDL 3 02/26/2018   Asthma- she is maintained on [place] and prn albuterol.  Depression reports stable mood  Hx of breast cancer-she continues to follow with oncology.  She is maintained on Arimidex. She has an upcoming appointment with oncology next month.   Hypothyroid-(Hx of thyroid cancer) She is maintained on Synthroid 200 mcg once daily. Lab Results  Component Value Date   TSH 2.01 07/05/2018   Overactive bladder-maintained on Myrbetriq.  Past Medical History:  Diagnosis Date  . Abnormally small mouth    . Achilles tendon disorder, right   . Anemia   . Anxiety   . Arthritis    hips and knees  . Asthma    daily inhaler, prn inhaler and neb.  . Asthma due to environmental allergies   . Breast cancer ([place]) 01/2014   left  . Cancer ([place])   . Chest pain   . COPD (chronic obstructive pulmonary disease) ([place])   . Dental bridge present    upper front and lower right  . Dental crowns present    x 3  . Depression   . Esophageal spasm    reports since her chemo and breast surgery  she developed esophageal spasms and reports this is in the past has casue her 02 to desat in the  70s , denies sycnope in relation to this , does report hx of vertigo as well   . Family history of anesthesia complication    twin brother aspirated and died on OR table, per pt.  . Fibromyalgia   . Gallbladder disease   . GERD (gastroesophageal reflux disease)   . H/O blood clots    had blood to  PICC line   . History of gastric ulcer    as a teenager  . History of seizure age 2   as a reaction to Penicillin - no seizures since  . History of stomach ulcers   . History of thyroid cancer    s/p thyroidectomy  . Hyperlipidemia   . Hypothyroidism   . Joint pain   .  Left knee injury   . Liver disorder    seen when she had her hysterectomy , reports she was told it was  " small lesion" ; but asymptomatic   . Lymphedema    left arm  . Migraines   . Multiple food allergies   . Obesity   . OSA (obstructive sleep apnea) 02/16/2016  . Palpitations    reports no longer experiences  . Personal history of chemotherapy 2015  . Personal history of radiation therapy 2015   Left  . Prediabetes   . Rheumatoid arthritis ([place])   . SOB (shortness of breath)   . Swelling of both ankles   . Tinnitus   . UTI (lower urinary tract infection) 02/17/2014  . Vertigo    last episode  was 2 weeks ago   . Vitamin D deficiency   . Wears contact lenses    left eye only   . Wears hearing aid in both ears      Social History    Socioeconomic History  . Marital status: Married    Spouse name: Not on file  . Number of children: Not on file  . Years of education: Not on file  . Highest education level: Not on file  Occupational History  . 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 Financial resource strain: Not on file  . Food insecurity    Worry: Not on file    Inability: Not on file  . Transportation needs    Medical: Not on file    Non-medical: Not on file  Tobacco Use  . Smoking status: Never Smoker  . Smokeless tobacco: Never Used  Substance and Sexual Activity  . Alcohol use: No    Alcohol/week: 0.0 standard drinks  . Drug use: No  . Sexual activity: Not Currently    Partners: Male    Comment: menarche age 2, P 2, first birth age 14, menopause age 24, Premarin x 10 yrs  Lifestyle  . Physical activity    Days per week: Not on file    Minutes per session: Not on file  . Stress: Not on file  Relationships  . Social Herbalist on phone: Not on file    Gets together: Not on file    Attends religious service: Not on file    Active member of club or organization: Not on file    Attends meetings of clubs or organizations: Not on file    Relationship status: Not on file  . Intimate partner violence    Fear of current or ex partner: Not on file    Emotionally abused: Not on file    Physically abused: Not on file    Forced sexual activity: Not on file  Other Topics Concern  . Not on file  Social History Narrative   Regular exercise: yes       Past Surgical History:  Procedure Laterality Date  . ABDOMINAL HYSTERECTOMY  2004   complete  . ACHILLES TENDON REPAIR Right   . APPENDECTOMY  2004  . BREAST EXCISIONAL BIOPSY    . BREAST LUMPECTOMY Left    2015  . BREAST LUMPECTOMY WITH NEEDLE LOCALIZATION AND AXILLARY SENTINEL LYMPH NODE BX Left 02/23/2014   Procedure: BREAST LUMPECTOMY WITH NEEDLE LOCALIZATION AND AXILLARY SENTINEL LYMPH NODE BIOPSY;   Surgeon: Excell Seltzer, MD;  Location: [facility];  Service: General;  Laterality: Left;  . BREAST SURGERY  2011  left breast biposy  . CHOLECYSTECTOMY  1990  . COLONOSCOPY W/ POLYPECTOMY  06/2009  . EYE SURGERY Right 2013   exc. warts from underneath eyelid  . INCONTINENCE SURGERY  2004  . KNEE ARTHROSCOPY Bilateral    x 6 each knee  . LIGAMENT REPAIR Right    thumb/wrist  . ORIF TOE FRACTURE Right    great toe  . PORTACATH PLACEMENT N/A 03/24/2014   Procedure: INSERTION PORT-A-CATH;  Surgeon: Excell Seltzer, MD;  Location: [facility];  Service: General;  Laterality: N/A;; removed   . THYROIDECTOMY  2000  . TONSILLECTOMY AND ADENOIDECTOMY  2000  . TOTAL KNEE ARTHROPLASTY Left 12/03/2017   Procedure: LEFT TOTAL KNEE ARTHROPLASTY;  Surgeon: Gaynelle Arabian, MD;  Location: [facility];  Service: Orthopedics;  Laterality: Left;  . TUMOR EXCISION     from thoracic spine    Family History  Problem Relation Age of Onset  . Alcohol abuse Mother   . Arthritis Mother   . Hypertension Mother   . Bipolar disorder Mother   . Breast cancer Mother 10       unconfirmed  . Lung cancer Mother 9       smoker  . Hyperlipidemia Mother   . Stroke Mother   . Cancer Mother   . Depression Mother   . Anxiety disorder Mother   . Alcoholism Mother   . Drug abuse Mother   . Eating disorder Mother   . Obesity Mother   . Alcohol abuse Father   . Cancer Father        lung  . Hyperlipidemia Father   . Kidney disease Father   . Diabetes Father   . Hypertension Father   . Cystic kidney disease Father   . Thyroid disease Father   . Liver disease Father   . Alcoholism Father   . Arthritis Maternal Grandmother   . Diabetes Paternal Grandmother   . Thyroid cancer Sister 34       type?; currently 38  . Other Sister        ovarian tumor @ 40; TAH/BSO  . Breast cancer Sister   . Thyroid cancer Brother        dx 84s; currently 34  . Breast cancer Maternal Aunt         dx 19s; deceased 87  . Thyroid cancer Paternal Aunt        All 3 paternal aunts with thyroid ca in 30s/40s  . Lung cancer Paternal Aunt        2 of 3 paternal aunts with lung cancer    Allergies  Allergen Reactions  . Bee Venom Anaphylaxis  . Contrast Media [Iodinated Diagnostic Agents] Shortness Of Breath  . Iodine Other (See Comments)    CARDIAC ARREST  . Latex Anaphylaxis and Rash  . Penicillins Shortness Of Breath, Rash and Other (See Comments)    SEIZURE Did it involve swelling of the face/tongue/throat, SOB, or low BP? no Did it involve sudden or severe rash/hives, skin peeling, or any reaction on the inside of your mouth or nose? yes Did you need to seek medical attention at a hospital or doctor's office? yes When did it last happen?2010 If all above answers are "NO", may proceed with cephalosporin use.   . Shellfish Allergy Shortness Of Breath and Rash  . Aspirin Rash and Other (See Comments)    GI UPSET  . Erythromycin Swelling and Rash    SWELLING OF JOINTS  . Lidocaine Swelling  SWELLING OF MOUTH AND THROAT  . Symbicort [Budesonide-Formoterol Fumarate] Other (See Comments)    BURNING OF TONGUE AND LIPS  . Betadine [Povidone Iodine]     Rash. Breathing problems.   . Codeine Rash  . Compazine [Prochlorperazine Maleate] Rash    Rash on face,chest, arms, back  . Pentazocine Lactate Rash  . Sulfonamide Derivatives Rash    Current Outpatient Medications on File Prior to Visit  Medication Sig Dispense Refill  . albuterol (PROAIR HFA) 108 (90 Base) MCG/ACT inhaler INHALE 2 PUFFS INTO THE LUNGS EVERY 6 HOURS AS NEEDED (Patient taking differently: Inhale 2 puffs into the lungs every 6 (six) hours as needed for wheezing or shortness of breath. INHALE 2 PUFFS INTO THE LUNGS EVERY 6 HOURS AS NEEDED) 3 Inhaler 1  . albuterol (PROVENTIL) (5 MG/ML) 0.5% nebulizer solution Take 0.5 mLs (2.5 mg total) by nebulization every 6 (six) hours as needed for wheezing or  shortness of breath. 40 mL 3  . amLODipine (NORVASC) 10 MG tablet Take 1 tablet (10 mg total) by mouth daily. 90 tablet 3  . anastrozole (ARIMIDEX) 1 MG tablet Take 1 tablet (1 mg total) by mouth daily. 90 tablet 3  . atorvastatin (LIPITOR) 20 MG tablet TAKE 1 TABLET BY MOUTH  DAILY (Patient taking differently: Take 20 mg by mouth daily at 6 PM. ) 90 tablet 1  . diphenhydrAMINE (BENADRYL) 25 MG tablet Take 1 tablet an hour before MRI.  Then take 1 tablet at bedtime as needed, per your usual regimen. (Patient taking differently: Take 25 mg by mouth at bedtime as needed for sleep. Take 1 tablet an hour before MRI.  Then take 1 tablet at bedtime as n) 30 tablet 0  . DULoxetine (CYMBALTA) 60 MG capsule TAKE 1 CAPSULE BY MOUTH  DAILY (Patient taking differently: Take 60 mg by mouth daily. ) 90 capsule 1  . EPINEPHrine 0.3 mg/0.3 mL IJ SOAJ injection Inject 0.3 mLs (0.3 mg total) into the muscle once. 2 Device 0  . Fluticasone-Salmeterol (WIXELA INHUB) 250-50 MCG/DOSE AEPB Inhale 1 puff into the lungs 2 (two) times daily. 180 each 3  . levothyroxine (SYNTHROID) 200 MCG tablet Take 1 tablet (200 mcg total) by mouth daily before breakfast. 90 tablet 2  . meloxicam (MOBIC) 7.5 MG tablet TAKE 1 TABLET BY MOUTH  DAILY 90 tablet 1  . MYRBETRIQ 50 MG TB24 tablet TAKE 1 TABLET BY MOUTH  DAILY 90 tablet 1  . ondansetron (ZOFRAN) 4 MG tablet Take 1 tablet (4 mg total) by mouth every 8 (eight) hours as needed for nausea or vomiting. 20 tablet 0  . SUMAtriptan (IMITREX) 50 MG tablet Take 1 tablet by mouth at start of migraine, may repeat in 2 hours if not improved. Max 2 tabs/24 hrs 10 tablet 2  . terbinafine (LAMISIL) 250 MG tablet Take 1 tablet (250 mg total) by mouth daily. 30 tablet 0   No current facility-administered medications on file prior to visit.     BP (!) 141/82 (BP Location: Left Arm, Patient Position: Sitting, Cuff Size: Large)   Pulse 85   Temp 98.7 F (37.1 C) (Temporal)   Wt 278 lb (126.1 kg)    SpO2 97%   BMI 42.90 kg/m    Observations/Objective:   Gen: Awake, alert, no acute distress Resp: Breathing is even and non-labored Psych: calm/pleasant demeanor Neuro: Alert and Oriented x 3, + facial symmetry, speech is clear.   Assessment and Plan:  HTN- bp is stable on current  medication. Continue same,  Hx of thyroid cancer/Hypothyroid- advised pt to schedule follow up with Dr. Cruzita Lederer.  Weight gain- advised pt to continue exercise and monitor caloric intake using my fitness pal.  Hx of breast cancer- clinically stable has follow up scheduled with oncology.  Vit D deficiency- will check vitamin D level.   Hyperglycemia- obtain A1C, especially in setting of weight gain.  Follow Up Instructions:    I discussed the assessment and treatment plan with the patient. The patient was provided an opportunity to ask questions and all were answered. The patient agreed with the plan and demonstrated an understanding of the instructions.   The patient was advised to call back or seek an in-person evaluation if the symptoms worsen or if the condition fails to improve as anticipated.  Nance Pear, NP

## 2019-01-29 NOTE — Telephone Encounter (Signed)
Return for for lab work in the next 1 week, complete physical in 1 month...  Called [facility] to resh.

## 2019-02-05 ENCOUNTER — Telehealth: Payer: Self-pay | Admitting: Family

## 2019-02-05 NOTE — Telephone Encounter (Signed)
Please contact pt to schedule a lab appointment.  

## 2019-02-09 ENCOUNTER — Other Ambulatory Visit: Payer: Self-pay | Admitting: Family

## 2019-02-10 NOTE — Telephone Encounter (Signed)
LM for pt to call and schedule lab visit

## 2019-02-14 ENCOUNTER — Other Ambulatory Visit: Payer: Self-pay | Admitting: *Deleted

## 2019-02-14 DIAGNOSIS — R739 Hyperglycemia, unspecified: Secondary | ICD-10-CM

## 2019-02-17 ENCOUNTER — Other Ambulatory Visit: Payer: 59

## 2019-02-17 ENCOUNTER — Other Ambulatory Visit: Payer: Self-pay | Admitting: Hematology and Oncology

## 2019-02-26 ENCOUNTER — Other Ambulatory Visit (INDEPENDENT_AMBULATORY_CARE_PROVIDER_SITE_OTHER): Payer: 59

## 2019-02-26 ENCOUNTER — Other Ambulatory Visit: Payer: Self-pay

## 2019-02-26 DIAGNOSIS — E559 Vitamin D deficiency, unspecified: Secondary | ICD-10-CM

## 2019-02-26 DIAGNOSIS — I10 Essential (primary) hypertension: Secondary | ICD-10-CM | POA: Diagnosis not present

## 2019-02-26 DIAGNOSIS — E785 Hyperlipidemia, unspecified: Secondary | ICD-10-CM

## 2019-02-26 LAB — BASIC METABOLIC PANEL
BUN: 14 mg/dL (ref 6–23)
CO2: 27 mEq/L (ref 19–32)
Calcium: 10.4 mg/dL (ref 8.4–10.5)
Chloride: 103 mEq/L (ref 96–112)
Creatinine, Ser: 0.84 mg/dL (ref 0.40–1.20)
GFR: 68.11 mL/min (ref >60.00)
Glucose, Bld: 111 mg/dL — ABNORMAL HIGH (ref 70–99)
Potassium: 4.1 mEq/L (ref 3.5–5.1)
Sodium: 140 mEq/L (ref 135–145)

## 2019-02-26 LAB — LIPID PANEL
Cholesterol: 189 mg/dL (ref 0–200)
HDL: 63.1 mg/dL (ref >39.00)
NonHDL: 125.82
Total CHOL/HDL Ratio: 3
Triglycerides: 254 mg/dL — ABNORMAL HIGH (ref 0.0–149.0)
VLDL: 50.8 mg/dL — ABNORMAL HIGH (ref 0.0–40.0)

## 2019-02-26 LAB — LDL CHOLESTEROL, DIRECT: Direct LDL: 117 mg/dL

## 2019-02-27 ENCOUNTER — Encounter: Payer: Self-pay | Admitting: Family

## 2019-03-01 LAB — VITAMIN D 1,25 DIHYDROXY
Vitamin D 1, 25 (OH)2 Total: 27 pg/mL (ref 18–72)
Vitamin D2 1, 25 (OH)2: <8 pg/mL
Vitamin D3 1, 25 (OH)2: 27 pg/mL

## 2019-03-10 ENCOUNTER — Other Ambulatory Visit: Payer: Self-pay

## 2019-03-10 ENCOUNTER — Ambulatory Visit (INDEPENDENT_AMBULATORY_CARE_PROVIDER_SITE_OTHER): Payer: 59 | Admitting: Medical

## 2019-03-10 ENCOUNTER — Encounter: Payer: Self-pay | Admitting: Medical

## 2019-03-10 VITALS — BP 141/84 | HR 76 | Temp 99.2°F

## 2019-03-10 DIAGNOSIS — J329 Chronic sinusitis, unspecified: Secondary | ICD-10-CM | POA: Diagnosis not present

## 2019-03-10 DIAGNOSIS — J309 Allergic rhinitis, unspecified: Secondary | ICD-10-CM

## 2019-03-10 MED ORDER — FLUTICASONE PROPIONATE 50 MCG/ACT NA SUSP: 2.0000 | Freq: Every day | NASAL | 1 refills | Status: DC

## 2019-03-10 MED ORDER — BENZONATATE 100 MG PO CAPS: 100.0000 mg | ORAL_CAPSULE | Freq: Three times a day (TID) | ORAL | 0 refills | Status: DC | PRN

## 2019-03-10 MED ORDER — DOXYCYCLINE HYCLATE 100 MG PO TABS: 100.0000 mg | ORAL_TABLET | Freq: Two times a day (BID) | ORAL | 0 refills | Status: DC

## 2019-03-10 NOTE — Patient Instructions (Addendum)
Probable allergic rhinitis with sinus infection.  Rx azithromycin antibiotic, flonase nasal spray and rx benzonatate for cough if needed.  If signs/symptoms persist or worsen then recommend covid test. Not indicated presently.  Follow up 7 days or as needed

## 2019-03-10 NOTE — Progress Notes (Signed)
Subjective:    Patient ID: Michelle Kennedy, female    DOB: 14-Jul-1955, 63 y.o.   MRN: [id number]  HPI   Virtual Visit via Telephone Note  I connected with Michelle Kennedy on 03/10/19 at  3:20 PM EDT by telephone and verified that I am speaking with the correct person using two identifiers.  Location: Patient: home Provider: office.    I discussed the assessment and treatment plan with the patient. The patient was provided an opportunity to ask questions and all were answered. The patient agreed with the plan and demonstrated an understanding of the instructions.   The patient was advised to call back or seek an in-person evaluation if the symptoms worsen or if the condition fails to improve as anticipated.  I provided 15 minutes of non-face-to-face time during this encounter.   Natesha Hassey, PA-C    hpi Pt in with some sinus pain, pnd, and some mild cough. Sinus infection 2-3 times a year in past.  No fever, no chills or sweats.  Pt has hx of allergies but not using any recently.  Has been sneezing.  Pt not working and will continue to stay at home as does not go out much anyway per her report.     Review of Systems  Constitutional: Negative for chills, fatigue and fever.  HENT: Positive for congestion, postnasal drip, sinus pressure and sinus pain. Negative for facial swelling, mouth sores, rhinorrhea and sore throat.   Respiratory: Positive for cough. Negative for chest tightness, shortness of breath and wheezing.   Cardiovascular: Negative for chest pain and palpitations.  Gastrointestinal: Negative for abdominal pain.  Musculoskeletal: Negative for arthralgias, back pain and gait problem.  Skin: Negative for rash.  Neurological: Negative for dizziness, speech difficulty, light-headedness and headaches.  Hematological: Negative for adenopathy. Does not bruise/bleed easily.  Psychiatric/Behavioral: Negative for behavioral problems and confusion.    Past  Medical History:  Diagnosis Date  . Abnormally small mouth   . Achilles tendon disorder, right   . Anemia   . Anxiety   . Arthritis    hips and knees  . Asthma    daily inhaler, prn inhaler and neb.  . Asthma due to environmental allergies   . Breast cancer ([place]) 01/2014   left  . Cancer ([place])   . Chest pain   . COPD (chronic obstructive pulmonary disease) ([place])   . Dental bridge present    upper front and lower right  . Dental crowns present    x 3  . Depression   . Esophageal spasm    reports since her chemo and breast surgery  she developed esophageal spasms and reports this is in the past has casue her 02 to desat in the  70s , denies sycnope in relation to this , does report hx of vertigo as well   . Family history of anesthesia complication    twin brother aspirated and died on OR table, per pt.  . Fibromyalgia   . Gallbladder disease   . GERD (gastroesophageal reflux disease)   . H/O blood clots    had blood to  PICC line   . History of gastric ulcer    as a teenager  . History of seizure age 50   as a reaction to Penicillin - no seizures since  . History of stomach ulcers   . History of thyroid cancer    s/p thyroidectomy  . Hyperlipidemia   . Hypothyroidism   . Joint  pain   . Left knee injury   . Liver disorder    seen when she had her hysterectomy , reports she was told it was  " small lesion" ; but asymptomatic   . Lymphedema    left arm  . Migraines   . Multiple food allergies   . Obesity   . OSA (obstructive sleep apnea) 02/16/2016  . Palpitations    reports no longer experiences  . Personal history of chemotherapy 2015  . Personal history of radiation therapy 2015   Left  . Prediabetes   . Rheumatoid arthritis ([place])   . SOB (shortness of breath)   . Swelling of both ankles   . Tinnitus   . UTI (lower urinary tract infection) 02/17/2014  . Vertigo    last episode  was 2 weeks ago   . Vitamin D deficiency   . Wears contact lenses    left eye  only   . Wears hearing aid in both ears      Social History   Socioeconomic History  . Marital status: Married    Spouse name: Not on file  . Number of children: Not on file  . Years of education: Not on file  . Highest education level: Not on file  Occupational History  . 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 Financial resource strain: Not on file  . Food insecurity    Worry: Not on file    Inability: Not on file  . Transportation needs    Medical: Not on file    Non-medical: Not on file  Tobacco Use  . Smoking status: Never Smoker  . Smokeless tobacco: Never Used  Substance and Sexual Activity  . Alcohol use: No    Alcohol/week: 0.0 standard drinks  . Drug use: No  . Sexual activity: Not Currently    Partners: Male    Comment: menarche age 56, P 2, first birth age 17, menopause age 8, Premarin x 10 yrs  Lifestyle  . Physical activity    Days per week: Not on file    Minutes per session: Not on file  . Stress: Not on file  Relationships  . Social Herbalist on phone: Not on file    Gets together: Not on file    Attends religious service: Not on file    Active member of club or organization: Not on file    Attends meetings of clubs or organizations: Not on file    Relationship status: Not on file  . Intimate partner violence    Fear of current or ex partner: Not on file    Emotionally abused: Not on file    Physically abused: Not on file    Forced sexual activity: Not on file  Other Topics Concern  . Not on file  Social History Narrative   Regular exercise: yes       Past Surgical History:  Procedure Laterality Date  . ABDOMINAL HYSTERECTOMY  2004   complete  . ACHILLES TENDON REPAIR Right   . APPENDECTOMY  2004  . BREAST EXCISIONAL BIOPSY    . BREAST LUMPECTOMY Left    2015  . BREAST LUMPECTOMY WITH NEEDLE LOCALIZATION AND AXILLARY SENTINEL LYMPH NODE BX Left 02/23/2014   Procedure: BREAST LUMPECTOMY WITH  NEEDLE LOCALIZATION AND AXILLARY SENTINEL LYMPH NODE BIOPSY;  Surgeon: Excell Seltzer, MD;  Location: [facility];  Service: General;  Laterality: Left;  . BREAST  SURGERY  2011   left breast biposy  . CHOLECYSTECTOMY  1990  . COLONOSCOPY W/ POLYPECTOMY  06/2009  . EYE SURGERY Right 2013   exc. warts from underneath eyelid  . INCONTINENCE SURGERY  2004  . KNEE ARTHROSCOPY Bilateral    x 6 each knee  . LIGAMENT REPAIR Right    thumb/wrist  . ORIF TOE FRACTURE Right    great toe  . PORTACATH PLACEMENT N/A 03/24/2014   Procedure: INSERTION PORT-A-CATH;  Surgeon: Excell Seltzer, MD;  Location: [facility];  Service: General;  Laterality: N/A;; removed   . THYROIDECTOMY  2000  . TONSILLECTOMY AND ADENOIDECTOMY  2000  . TOTAL KNEE ARTHROPLASTY Left 12/03/2017   Procedure: LEFT TOTAL KNEE ARTHROPLASTY;  Surgeon: Gaynelle Arabian, MD;  Location: [facility];  Service: Orthopedics;  Laterality: Left;  . TUMOR EXCISION     from thoracic spine    Family History  Problem Relation Age of Onset  . Alcohol abuse Mother   . Arthritis Mother   . Hypertension Mother   . Bipolar disorder Mother   . Breast cancer Mother 33       unconfirmed  . Lung cancer Mother 7       smoker  . Hyperlipidemia Mother   . Stroke Mother   . Cancer Mother   . Depression Mother   . Anxiety disorder Mother   . Alcoholism Mother   . Drug abuse Mother   . Eating disorder Mother   . Obesity Mother   . Alcohol abuse Father   . Cancer Father        lung  . Hyperlipidemia Father   . Kidney disease Father   . Diabetes Father   . Hypertension Father   . Cystic kidney disease Father   . Thyroid disease Father   . Liver disease Father   . Alcoholism Father   . Arthritis Maternal Grandmother   . Diabetes Paternal Grandmother   . Thyroid cancer Sister 35       type?; currently 24  . Other Sister        ovarian tumor @ 33; TAH/BSO  . Breast cancer Sister   . Thyroid cancer Brother         dx 11s; currently 17  . Breast cancer Maternal Aunt        dx 44s; deceased 30  . Thyroid cancer Paternal Aunt        All 3 paternal aunts with thyroid ca in 30s/40s  . Lung cancer Paternal Aunt        2 of 3 paternal aunts with lung cancer    Allergies  Allergen Reactions  . Bee Venom Anaphylaxis  . Contrast Media [Iodinated Diagnostic Agents] Shortness Of Breath  . Iodine Other (See Comments)    CARDIAC ARREST  . Latex Anaphylaxis and Rash  . Penicillins Shortness Of Breath, Rash and Other (See Comments)    SEIZURE Did it involve swelling of the face/tongue/throat, SOB, or low BP? no Did it involve sudden or severe rash/hives, skin peeling, or any reaction on the inside of your mouth or nose? yes Did you need to seek medical attention at a hospital or doctor's office? yes When did it last happen?2010 If all above answers are "NO", may proceed with cephalosporin use.   . Shellfish Allergy Shortness Of Breath and Rash  . Aspirin Rash and Other (See Comments)    GI UPSET  . Erythromycin Swelling and Rash    SWELLING OF  JOINTS  . Lidocaine Swelling    SWELLING OF MOUTH AND THROAT  . Symbicort [Budesonide-Formoterol Fumarate] Other (See Comments)    BURNING OF TONGUE AND LIPS  . Betadine [Povidone Iodine]     Rash. Breathing problems.   . Codeine Rash  . Compazine [Prochlorperazine Maleate] Rash    Rash on face,chest, arms, back  . Pentazocine Lactate Rash  . Sulfonamide Derivatives Rash    Current Outpatient Medications on File Prior to Visit  Medication Sig Dispense Refill  . albuterol (PROVENTIL) (5 MG/ML) 0.5% nebulizer solution Take 0.5 mLs (2.5 mg total) by nebulization every 6 (six) hours as needed for wheezing or shortness of breath. 40 mL 3  . amLODipine (NORVASC) 10 MG tablet Take 1 tablet (10 mg total) by mouth daily. 90 tablet 3  . anastrozole (ARIMIDEX) 1 MG tablet TAKE 1 TABLET BY MOUTH  DAILY 90 tablet 0  . atorvastatin (LIPITOR) 20 MG tablet Take  1 tablet (20 mg total) by mouth daily at 6 PM. 90 tablet 0  . diphenhydrAMINE (BENADRYL) 25 MG tablet Take 1 tablet an hour before MRI.  Then take 1 tablet at bedtime as needed, per your usual regimen. (Patient taking differently: Take 25 mg by mouth at bedtime as needed for sleep. Take 1 tablet an hour before MRI.  Then take 1 tablet at bedtime as n) 30 tablet 0  . DULoxetine (CYMBALTA) 60 MG capsule TAKE 1 CAPSULE BY MOUTH  DAILY (Patient taking differently: Take 60 mg by mouth daily. ) 90 capsule 1  . EPINEPHrine 0.3 mg/0.3 mL IJ SOAJ injection Inject 0.3 mLs (0.3 mg total) into the muscle once. 2 Device 0  . Fluticasone-Salmeterol (WIXELA INHUB) 250-50 MCG/DOSE AEPB Inhale 1 puff into the lungs 2 (two) times daily. 180 each 3  . levothyroxine (SYNTHROID) 200 MCG tablet Take 1 tablet (200 mcg total) by mouth daily before breakfast. 90 tablet 2  . meloxicam (MOBIC) 7.5 MG tablet TAKE 1 TABLET BY MOUTH  DAILY 90 tablet 1  . MYRBETRIQ 50 MG TB24 tablet TAKE 1 TABLET BY MOUTH  DAILY 90 tablet 1  . ondansetron (ZOFRAN) 4 MG tablet Take 1 tablet (4 mg total) by mouth every 8 (eight) hours as needed for nausea or vomiting. 20 tablet 0  . SUMAtriptan (IMITREX) 50 MG tablet Take 1 tablet by mouth at start of migraine, may repeat in 2 hours if not improved. Max 2 tabs/24 hrs 10 tablet 2  . albuterol (PROAIR HFA) 108 (90 Base) MCG/ACT inhaler INHALE 2 PUFFS INTO THE LUNGS EVERY 6 HOURS AS NEEDED (Patient taking differently: Inhale 2 puffs into the lungs every 6 (six) hours as needed for wheezing or shortness of breath. INHALE 2 PUFFS INTO THE LUNGS EVERY 6 HOURS AS NEEDED) 3 Inhaler 1   No current facility-administered medications on file prior to visit.     BP (!) 141/84   Pulse 76   Temp 99.2 F (37.3 C) (Oral)   SpO2 97%       Objective:   Physical Exam  General- no acute distress, pleasant pt. Oriented. Normal speech.      Assessment & Plan:  Probable allergic rhinitis with sinus  infection.  Rx azithromycin antibiotic, flonase nasal spray and rx benzonatate for cough if needed.  If signs/symptoms persist or worsen then recommend covid test. Not indicated presently.  Follow up 7 days or as needed  [employer]

## 2019-03-12 ENCOUNTER — Telehealth: Payer: Self-pay

## 2019-03-12 NOTE — Telephone Encounter (Signed)
Copied from [place] [id number]. Topic: General - Other >> Mar 12, 2019  1:01 PM Carolyn Stare wrote: Pt wanted Percell Miller to know she was feeling better

## 2019-03-12 NOTE — Telephone Encounter (Signed)
Glad to hear feeling better.

## 2019-03-21 ENCOUNTER — Telehealth: Payer: Self-pay | Admitting: Family

## 2019-03-21 DIAGNOSIS — L600 Ingrowing nail: Secondary | ICD-10-CM

## 2019-03-21 NOTE — Telephone Encounter (Signed)
Patient is calling to receive a referral for an ingrown toe nail in the [facility] area. Please advise. CB- [phone]

## 2019-03-30 NOTE — Progress Notes (Signed)
Patient Care Team: Debbrah Alar, NP as PCP - General (Internal Medicine) Excell Seltzer, MD as Consulting Physician (General Surgery) Nicholas Lose, MD as Consulting Physician (Hematology and Oncology) [name], MD as Attending Physician (Radiation Oncology) Katheran James., MD (Endocrinology) Maxwell Marion, RN as Registered Nurse (Medical Oncology)  DIAGNOSIS:    ICD-10-CM   1. Malignant neoplasm of upper-outer quadrant of left breast in female, estrogen receptor positive ([place])  C50.412    Z17.0     SUMMARY OF ONCOLOGIC HISTORY: Oncology History  Breast cancer of upper-outer quadrant of left female breast ([place])  12/22/2013 Mammogram   Left breast upper-outer quadrant 1.8 x 1.4 x 1.6 cm mass with left axillary lymph node measuring 3.6 mm   12/30/2013 Breast MRI   Dominant enhancing left upper outer quadrant irregular mass encompassing a confluent area of abnormal clumped nodular enhancement, 5.5 cm overall, 6 mm masslike enhancement central left breast     02/23/2014 Surgery   Left lumpectomy: Invasive ductal carcinoma grade 2 spending 3.8 cm intermediate grade DCIS with lymphovascular invasion margins negative one out of 4 SLN positive, ER 99%, PR 100%, HER-2 negative ratio 0.74, T2, N1, M0 stage IIB   03/19/2014 PET scan   No evidence of distant metastatic disease   03/31/2014 - 08/18/2014 Chemotherapy   Adjuvant chemotherapy with dose dense Adriamycin Cytoxan x4 followed by Abraxane weekly x12   08/28/2014 Imaging   Left brachial vein thrombus   09/11/2014 -  Radiation Therapy   Adjuvant radiation therapy   11/20/2014 -  Anti-estrogen oral therapy   Anastrozole 1 mg daily + Ibrance ([name] clinical trial)     CHIEF COMPLIANT: Follow-up on anastrozole   INTERVAL HISTORY: Michelle Kennedy is a 63 y.o. with above-mentioned history of left breast cancer who underwent a lumpectomy, adjuvant chemotherapy, and radiation. She participated in the [place]  clinical trial and took 2 years of Ibrance along with anastrozole. She is currently on anastrozole. I last saw her a year ago. She presents to the clinic today for follow-up.  She has gained significant amount of weight and has had problems with elevated blood sugars and cholesterol.  She has not been able to exercise very much.  She had knee surgery in February 2020.  REVIEW OF SYSTEMS:   Constitutional: Denies fevers, chills or abnormal weight loss Eyes: Denies blurriness of vision Ears, nose, mouth, throat, and face: Denies mucositis or sore throat Respiratory: Denies cough, dyspnea or wheezes Cardiovascular: Denies palpitation, chest discomfort Gastrointestinal: Denies nausea, heartburn or change in bowel habits Skin: Denies abnormal skin rashes Lymphatics: Denies new lymphadenopathy or easy bruising Neurological: Denies numbness, tingling or new weaknesses Behavioral/Psych: Mood is stable, no new changes  Extremities: No lower extremity edema, left knee surgery Breast: denies any pain or lumps or nodules in either breasts All other systems were reviewed with the patient and are negative.  I have reviewed the past medical history, past surgical history, social history and family history with the patient and they are unchanged from previous note.  ALLERGIES:  is allergic to bee venom; contrast media [iodinated diagnostic agents]; iodine; latex; penicillins; shellfish allergy; aspirin; erythromycin; lidocaine; symbicort [budesonide-formoterol fumarate]; betadine [povidone iodine]; codeine; compazine [prochlorperazine maleate]; pentazocine lactate; and sulfonamide derivatives.  MEDICATIONS:  Current Outpatient Medications  Medication Sig Dispense Refill  . albuterol (PROAIR HFA) 108 (90 Base) MCG/ACT inhaler INHALE 2 PUFFS INTO THE LUNGS EVERY 6 HOURS AS NEEDED (Patient taking differently: Inhale 2 puffs into the lungs every  6 (six) hours as needed for wheezing or shortness of breath.  INHALE 2 PUFFS INTO THE LUNGS EVERY 6 HOURS AS NEEDED) 3 Inhaler 1  . albuterol (PROVENTIL) (5 MG/ML) 0.5% nebulizer solution Take 0.5 mLs (2.5 mg total) by nebulization every 6 (six) hours as needed for wheezing or shortness of breath. 40 mL 3  . amLODipine (NORVASC) 10 MG tablet Take 1 tablet (10 mg total) by mouth daily. 90 tablet 3  . anastrozole (ARIMIDEX) 1 MG tablet TAKE 1 TABLET BY MOUTH  DAILY 90 tablet 0  . atorvastatin (LIPITOR) 20 MG tablet Take 1 tablet (20 mg total) by mouth daily at 6 PM. 90 tablet 0  . benzonatate (TESSALON) 100 MG capsule Take 1 capsule (100 mg total) by mouth 3 (three) times daily as needed for cough. 30 capsule 0  . diphenhydrAMINE (BENADRYL) 25 MG tablet Take 1 tablet an hour before MRI.  Then take 1 tablet at bedtime as needed, per your usual regimen. (Patient taking differently: Take 25 mg by mouth at bedtime as needed for sleep. Take 1 tablet an hour before MRI.  Then take 1 tablet at bedtime as n) 30 tablet 0  . doxycycline (VIBRA-TABS) 100 MG tablet Take 1 tablet (100 mg total) by mouth 2 (two) times daily. Can give caps or generic. 20 tablet 0  . DULoxetine (CYMBALTA) 60 MG capsule TAKE 1 CAPSULE BY MOUTH  DAILY (Patient taking differently: Take 60 mg by mouth daily. ) 90 capsule 1  . EPINEPHrine 0.3 mg/0.3 mL IJ SOAJ injection Inject 0.3 mLs (0.3 mg total) into the muscle once. 2 Device 0  . fluticasone (FLONASE) 50 MCG/ACT nasal spray Place 2 sprays into both nostrils daily. 16 g 1  . Fluticasone-Salmeterol (WIXELA INHUB) 250-50 MCG/DOSE AEPB Inhale 1 puff into the lungs 2 (two) times daily. 180 each 3  . levothyroxine (SYNTHROID) 200 MCG tablet Take 1 tablet (200 mcg total) by mouth daily before breakfast. 90 tablet 2  . meloxicam (MOBIC) 7.5 MG tablet TAKE 1 TABLET BY MOUTH  DAILY 90 tablet 1  . MYRBETRIQ 50 MG TB24 tablet TAKE 1 TABLET BY MOUTH  DAILY 90 tablet 1  . ondansetron (ZOFRAN) 4 MG tablet Take 1 tablet (4 mg total) by mouth every 8 (eight)  hours as needed for nausea or vomiting. 20 tablet 0  . SUMAtriptan (IMITREX) 50 MG tablet Take 1 tablet by mouth at start of migraine, may repeat in 2 hours if not improved. Max 2 tabs/24 hrs 10 tablet 2   No current facility-administered medications for this visit.     PHYSICAL EXAMINATION: ECOG PERFORMANCE STATUS: 1 - Symptomatic but completely ambulatory  Vitals:   03/31/19 1502  BP: (!) 155/89  Pulse: 65  Resp: 18  Temp: 98.7 F (37.1 C)  SpO2: 100%   Filed Weights   03/31/19 1502  Weight: 281 lb 12.8 oz (127.8 kg)    GENERAL: alert, no distress and comfortable SKIN: skin color, texture, turgor are normal, no rashes or significant lesions EYES: normal, Conjunctiva are pink and non-injected, sclera clear OROPHARYNX: no exudate, no erythema and lips, buccal mucosa, and tongue normal  NECK: supple, thyroid normal size, non-tender, without nodularity LYMPH: no palpable lymphadenopathy in the cervical, axillary or inguinal LUNGS: clear to auscultation and percussion with normal breathing effort HEART: regular rate & rhythm and no murmurs and no lower extremity edema ABDOMEN: abdomen soft, non-tender and normal bowel sounds MUSCULOSKELETAL: no cyanosis of digits and no clubbing  NEURO: alert &  oriented x 3 with fluent speech, no focal motor/sensory deficits EXTREMITIES: No lower extremity edema BREAST: No palpable masses or nodules in either right or left breasts. No palpable axillary supraclavicular or infraclavicular adenopathy no breast tenderness or nipple discharge. (exam performed in the presence of a chaperone)  LABORATORY DATA:  I have reviewed the data as listed CMP Latest Ref Rng & Units 02/26/2019 07/05/2018 02/26/2018  Glucose 70 - 99 mg/dL 111(H) - 93  BUN 6 - 23 mg/dL 14 - 13  Creatinine 0.40 - 1.20 mg/dL 0.84 - 0.83  Sodium 135 - 145 mEq/L 140 - 141  Potassium 3.5 - 5.1 mEq/L 4.1 - 4.3  Chloride 96 - 112 mEq/L 103 - 104  CO2 19 - 32 mEq/L 27 - 30  Calcium 8.4  - 10.5 mg/dL 10.4 - 10.2  Total Protein 6.0 - 8.3 g/dL - 6.6 -  Total Bilirubin 0.2 - 1.2 mg/dL - 0.5 -  Alkaline Phos 39 - 117 U/L - 71 -  AST 0 - 37 U/L - 23 -  ALT 0 - 35 U/L - 36(H) -    Lab Results  Component Value Date   WBC 14.3 (H) 12/05/2017   HGB 11.5 (L) 12/05/2017   HCT 36.6 12/05/2017   MCV 82.2 12/05/2017   PLT 266 12/05/2017   NEUTROABS 4.6 09/10/2017    ASSESSMENT & PLAN:  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 M0 stage IIB Adjuvant chemotherapy Completed 4 cycles of dose dense Adriamycin and Cytoxan. Followed by Abraxane X 12 completed 08/18/14, status post radiation completed June 2016, started anastrozole 1 mg daily 11/20/2014 (also enrolled on [name] clinical trial randomized to [facility]) completed 2 years9/08/2016  Breast Cancer Surveillance: 1. Breast exam:11/9/2020normal except for scar tissue related to prior surgery and radiation 2. Mammogram 2/11/2019cegory B breast density. No mammographic abnormalities postsurgical changes.  Weight gain: I discussed with her about intermittent fasting. I encouraged her to exercise at least by walking and when the pulls are open she can go back to swimming.  Breast cancer surveillance: 1.  Breast exam 03/31/2019: Benign 2.  Mammograms: Postponed because of COVID-19 and other technical issues.  We will try to reschedule it for her.  RTCone yearfor labs and follow-up  No orders of the defined types were placed in this encounter.  The patient has a good understanding of the overall plan. she agrees with it. she will call with any problems that may develop before the next visit here.  Nicholas Lose, MD 03/31/2019  Julious Oka Dorshimer am acting as scribe for Dr. Nicholas Lose.  I have reviewed the above documentation for accuracy and completeness,  and I agree with the above.

## 2019-03-31 ENCOUNTER — Other Ambulatory Visit: Payer: Self-pay

## 2019-03-31 ENCOUNTER — Inpatient Hospital Stay: Payer: 59 | Attending: Hematology and Oncology | Admitting: Hematology and Oncology

## 2019-03-31 ENCOUNTER — Other Ambulatory Visit: Payer: Self-pay | Admitting: Hematology and Oncology

## 2019-03-31 DIAGNOSIS — Z9221 Personal history of antineoplastic chemotherapy: Secondary | ICD-10-CM | POA: Insufficient documentation

## 2019-03-31 DIAGNOSIS — Z79811 Long term (current) use of aromatase inhibitors: Secondary | ICD-10-CM | POA: Insufficient documentation

## 2019-03-31 DIAGNOSIS — Z923 Personal history of irradiation: Secondary | ICD-10-CM | POA: Diagnosis not present

## 2019-03-31 DIAGNOSIS — Z7951 Long term (current) use of inhaled steroids: Secondary | ICD-10-CM | POA: Diagnosis not present

## 2019-03-31 DIAGNOSIS — Z170 Estrogen receptor positive status [ER+]: Secondary | ICD-10-CM | POA: Insufficient documentation

## 2019-03-31 DIAGNOSIS — R739 Hyperglycemia, unspecified: Secondary | ICD-10-CM | POA: Diagnosis not present

## 2019-03-31 DIAGNOSIS — C50412 Malignant neoplasm of upper-outer quadrant of left female breast: Secondary | ICD-10-CM | POA: Insufficient documentation

## 2019-03-31 DIAGNOSIS — Z791 Long term (current) use of non-steroidal anti-inflammatories (NSAID): Secondary | ICD-10-CM | POA: Diagnosis not present

## 2019-03-31 DIAGNOSIS — Z79899 Other long term (current) drug therapy: Secondary | ICD-10-CM | POA: Diagnosis not present

## 2019-03-31 DIAGNOSIS — Z853 Personal history of malignant neoplasm of breast: Secondary | ICD-10-CM

## 2019-03-31 MED ORDER — ANASTROZOLE 1 MG PO TABS: 1.0000 mg | ORAL_TABLET | Freq: Every day | ORAL | 3 refills | Status: DC

## 2019-03-31 NOTE — Assessment & Plan Note (Signed)
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 M0 stage IIB Adjuvant chemotherapy Completed 4 cycles of dose dense Adriamycin and Cytoxan. Followed by Abraxane X 12 completed 08/18/14, status post radiation completed June 2016, started anastrozole 1 mg daily 11/20/2014 (also enrolled on [name] clinical trial randomized to [facility]) completed 2 years9/08/2016  Breast Cancer Surveillance: 1. Breast exam:11/9/2020normal except for scar tissue related to prior surgery and radiation 2. Mammogram 2/11/2019cegory B breast density. No mammographic abnormalities postsurgical changes.  RTCone yearfor labs and follow-up

## 2019-04-01 ENCOUNTER — Telehealth: Payer: Self-pay | Admitting: Hematology and Oncology

## 2019-04-01 NOTE — Telephone Encounter (Signed)
I talk with patient regarding schedule  

## 2019-04-09 ENCOUNTER — Other Ambulatory Visit: Payer: Self-pay

## 2019-04-09 ENCOUNTER — Ambulatory Visit
Admission: RE | Admit: 2019-04-09 | Discharge: 2019-04-09 | Disposition: A | Discharge status: Home / Self Care | Payer: 59 | Source: Ambulatory Visit | Attending: Hematology and Oncology | Admitting: Hematology and Oncology

## 2019-04-09 DIAGNOSIS — Z853 Personal history of malignant neoplasm of breast: Secondary | ICD-10-CM

## 2019-04-10 IMAGING — US US SOFT TISSUE HEAD/NECK
1 series · 14 of 14 positions shown · non-contrast
Comparison: 03/19/2014 PET-CT, 11/03/2010 ultrasound report

CLINICAL DATA: Previous thyroidectomy for papillary thyroid cancer

EXAM:
THYROID ULTRASOUND
TECHNIQUE: Ultrasound examination of the thyroid gland and adjacent soft
tissues was performed.

[Series 1: us soft tissue head/neck · 0.07mm/px · 14 acquisitions, 14 frames shown]
[im 1/14]
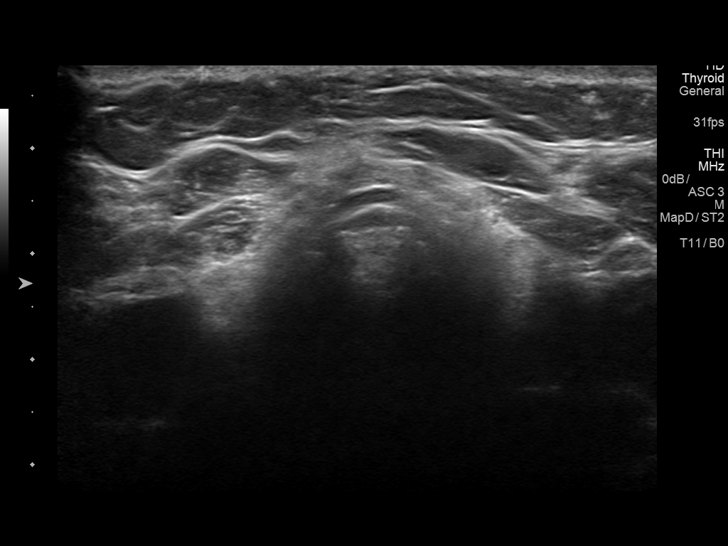
[im 2/14]
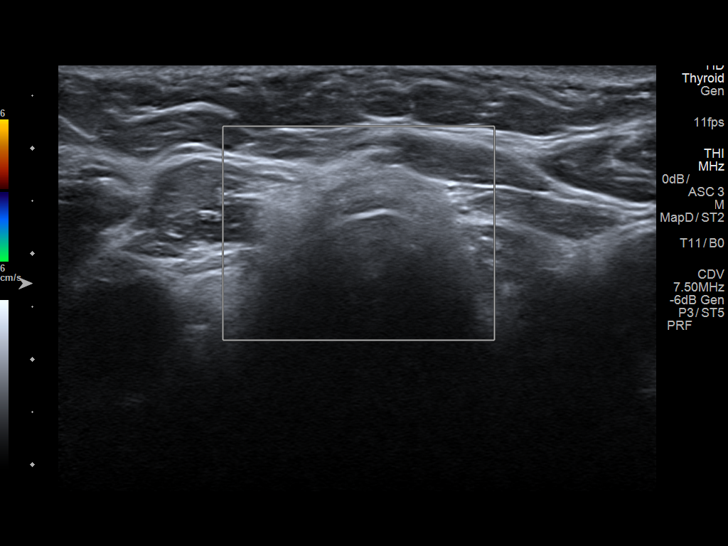
[im 3/14]
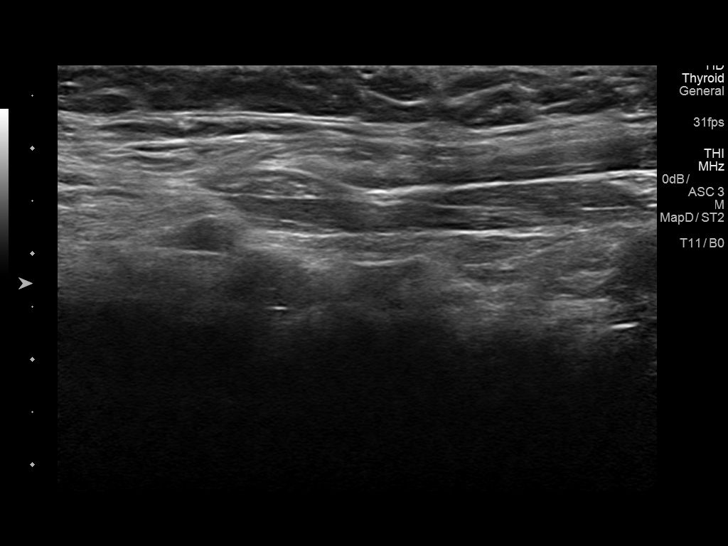
[im 4/14]
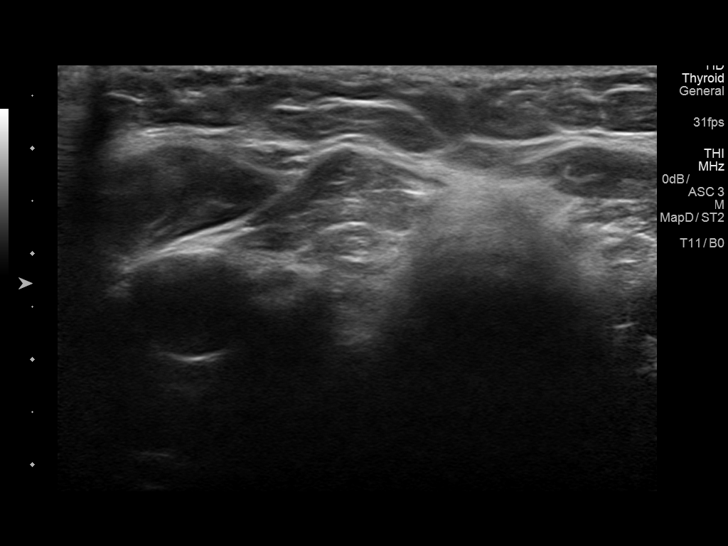
[im 5/14]
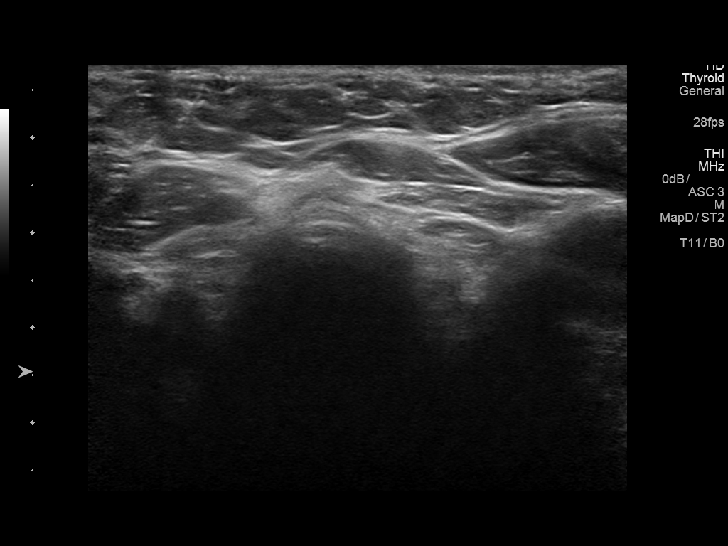
[im 6/14]
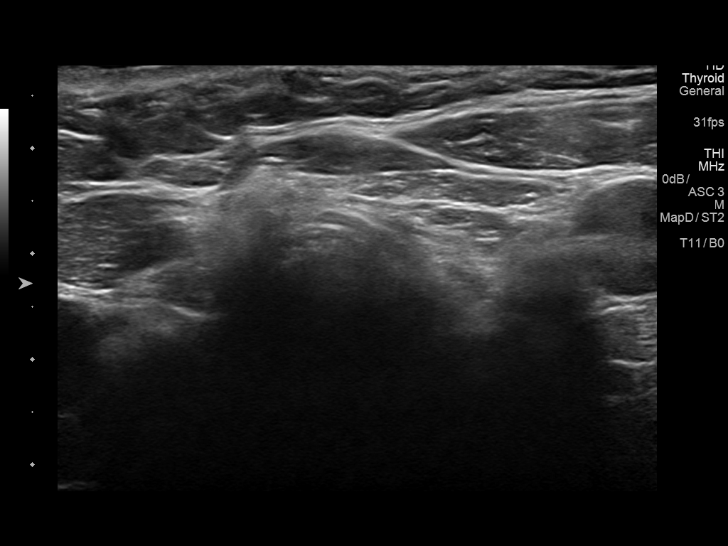
[im 7/14]
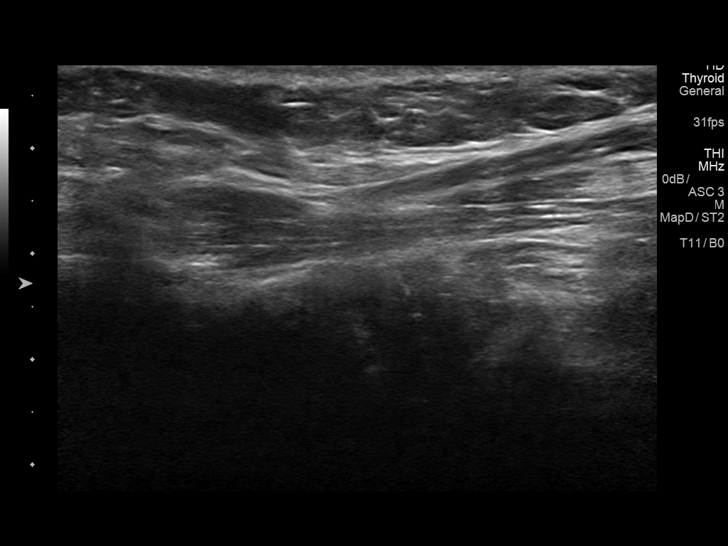
[im 8/14]
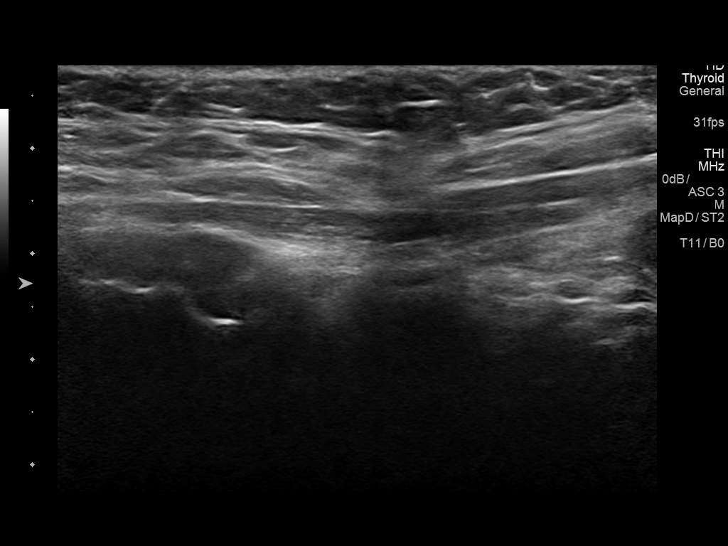
[im 9/14]
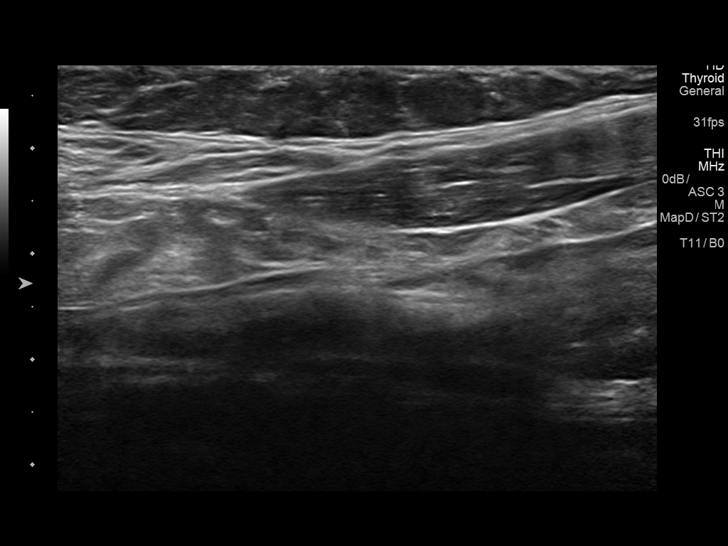
[im 10/14]
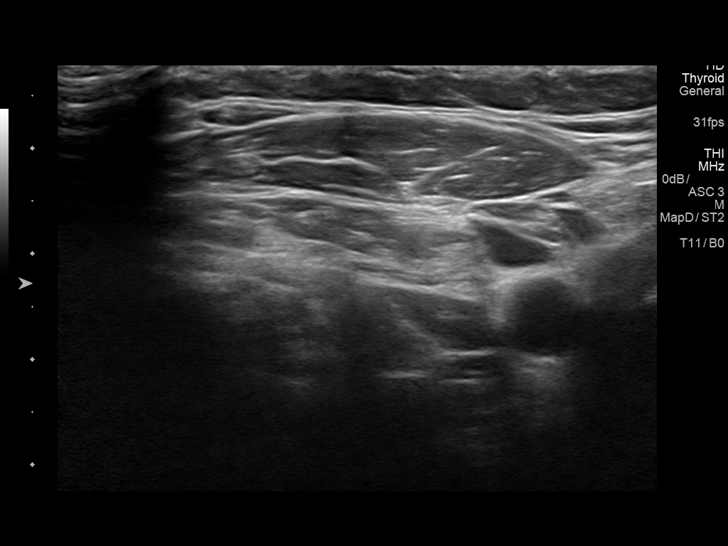
[im 11/14]
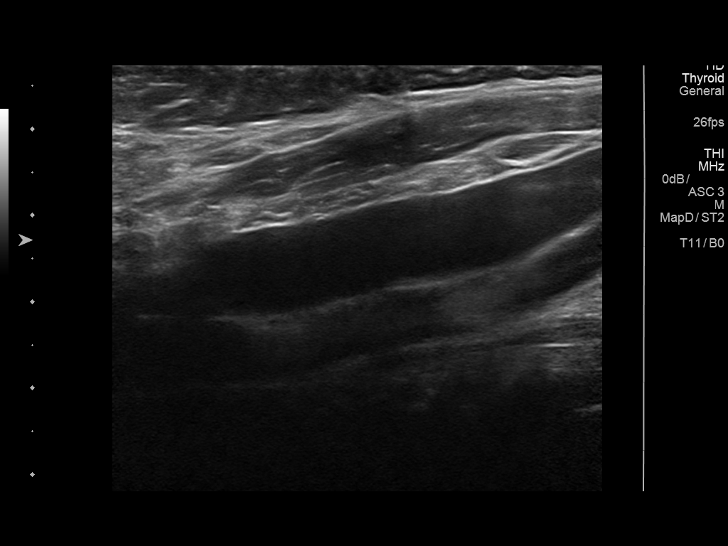
[im 12/14]
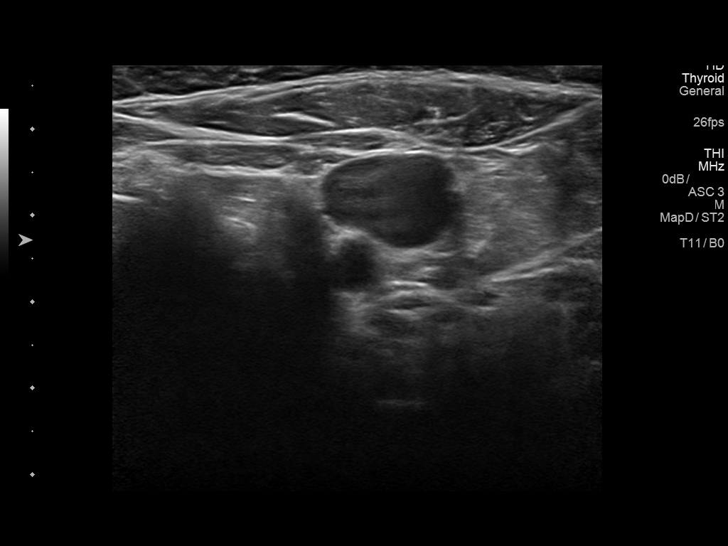
[im 13/14]
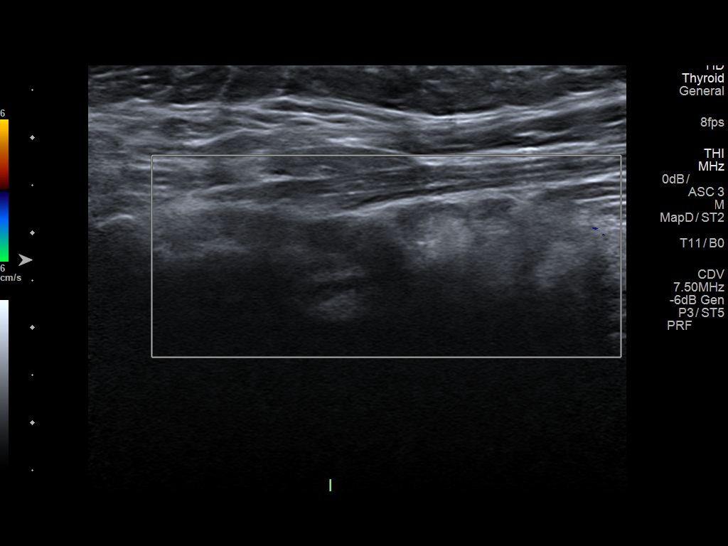
[im 14/14]
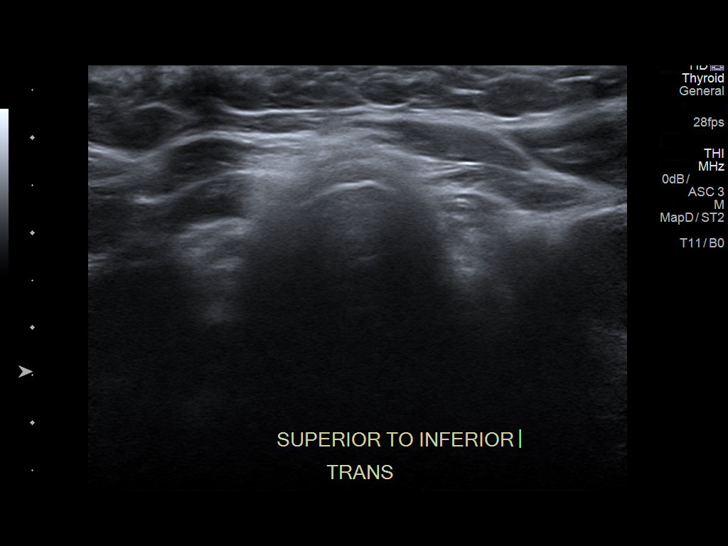

[14 of 14 positions shown; findings below may reference images not displayed]

FINDINGS: Complete thyroidectomy. No residual soft tissue abnormality thyroid
read on either side

_________________________________________________________

Estimated total number of nodules >/= 1 cm: 0

Number of spongiform nodules >/=  2 cm not described below (TR1): 0

Number of mixed cystic and solid nodules >/= 1.5 cm not described
below (TR2): 0

_________________________________________________________

No adenopathy.
IMPRESSION: Complete thyroidectomy without significant abnormality by
ultrasound.

The above is in keeping with the ACR TI-RADS recommendations - [HOSPITAL] 1037;[DATE].

## 2019-04-20 ENCOUNTER — Telehealth: Payer: Self-pay | Admitting: Family

## 2019-04-25 NOTE — Telephone Encounter (Signed)
Pt called in to follow up on refill request for inhaler and also meloxicam (MOBIC) 7.5 MG tablet. Please assist.     Pharmacy:  [place], [place] [facility] [phone] (Phone) [phone] (Fax)

## 2019-04-30 NOTE — Telephone Encounter (Signed)
This rx was sent on 04-20-19

## 2019-05-04 ENCOUNTER — Other Ambulatory Visit: Payer: Self-pay | Admitting: Family

## 2019-05-06 ENCOUNTER — Other Ambulatory Visit: Payer: Self-pay | Admitting: Family

## 2019-05-06 MED ORDER — ALBUTEROL SULFATE HFA 108 (90 BASE) MCG/ACT IN AERS: 2.0000 | INHALATION_SPRAY | Freq: Four times a day (QID) | RESPIRATORY_TRACT | 0 refills | Status: DC | PRN

## 2019-05-06 MED ORDER — LEVOTHYROXINE SODIUM 200 MCG PO TABS: 200.0000 ug | ORAL_TABLET | Freq: Every day | ORAL | 0 refills | Status: DC

## 2019-05-06 NOTE — Telephone Encounter (Signed)
Requested medication (s) are due for refill today: yes  Requested medication (s) are on the active medication list: yes  Last refill:  07/31/2018  Future visit scheduled: no  Notes to clinic:review for refill One inhaler should last on month Patient due for labs Patient would like a mychart message once this is sent   Requested Prescriptions  Pending Prescriptions Disp Refills   albuterol (PROAIR HFA) 108 (90 Base) MCG/ACT inhaler      Sig: INHALE 2 PUFFS INTO THE LUNGS EVERY 6 HOURS AS NEEDED      Pulmonology:  Beta Agonists Failed - 05/06/2019 10:55 AM      Failed - One inhaler should last at least one month. If the patient is requesting refills earlier, contact the patient to check for uncontrolled symptoms.      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      Endocrinology:  Hypothyroid Agents Failed - 05/06/2019 10:55 AM      Failed - TSH needs to be rechecked within 3 months after an abnormal result. Refill until TSH is due.      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

## 2019-05-06 NOTE — Telephone Encounter (Signed)
Copied from [place] [id number]. Topic: Quick Communication - Rx Refill/Question >> May 06, 2019 10:50 AM Leward Quan A wrote: Medication: albuterol (PROAIR HFA) 108 (90 Base) MCG/ACT inhaler, levothyroxine (SYNTHROID) 200 MCG tablet   Patient would like an update per My Chart when refill have been sent also was originally ordered over a week ago.   Has the patient contacted their pharmacy? Yes  (Agent: If no, request that the patient contact the pharmacy for the refill.) (Agent: If yes, when and what did the pharmacy advise?)  Preferred Pharmacy (with phone number or street name): [place], [place] [facility] [employer]  Phone:  [phone] Fax:  [phone]     Agent: Please be advised that RX refills may take up to 3 business days. We ask that you follow-up with your pharmacy.

## 2019-05-09 ENCOUNTER — Telehealth: Payer: Self-pay | Admitting: Family

## 2019-05-09 ENCOUNTER — Other Ambulatory Visit: Payer: Self-pay

## 2019-05-09 MED ORDER — ALBUTEROL SULFATE HFA 108 (90 BASE) MCG/ACT IN AERS: 2.0000 | INHALATION_SPRAY | Freq: Four times a day (QID) | RESPIRATORY_TRACT | 0 refills | Status: DC | PRN

## 2019-05-09 NOTE — Telephone Encounter (Signed)
error 

## 2019-05-13 ENCOUNTER — Other Ambulatory Visit: Payer: Self-pay | Admitting: Family

## 2019-05-13 MED ORDER — ALBUTEROL SULFATE HFA 108 (90 BASE) MCG/ACT IN AERS: 2.0000 | INHALATION_SPRAY | Freq: Four times a day (QID) | RESPIRATORY_TRACT | 0 refills | Status: DC | PRN

## 2019-05-13 NOTE — Telephone Encounter (Signed)
Medication Refill - Medication: albuterol (PROAIR HFA) 108 (90 Base) MCG/ACT inhaler  Has the patient contacted their pharmacy? Yes - this has been requested through mail order but will not arrive in time.  Pt requesting a 30 day supply be sent in to the local pharmacy so she can have it. (Agent: If no, request that the patient contact the pharmacy for the refill.) (Agent: If yes, when and what did the pharmacy advise?)  Preferred Pharmacy (with phone number or street name):  [id number] - [place], [place] - [address] AT [place] Phone:  [phone]  Fax:  [phone]     Agent: Please be advised that RX refills may take up to 3 business days. We ask that you follow-up with your pharmacy.

## 2019-06-08 ENCOUNTER — Other Ambulatory Visit: Payer: Self-pay | Admitting: Family

## 2019-06-15 ENCOUNTER — Other Ambulatory Visit: Payer: Self-pay | Admitting: Family

## 2019-07-02 ENCOUNTER — Telehealth: Payer: Self-pay

## 2019-07-02 NOTE — Telephone Encounter (Signed)
Lvm for patient to be aware I called [employer] and the representative I spoke to  Could not find her in the system. I will need any information she has to try to contact them in her behalf.

## 2019-07-02 NOTE — Telephone Encounter (Signed)
Patient called in to see if Dr. Conley Canal can send in a prescription  Refill for  her Cpap supplies. Per the patient she is completely out of supplies. Please follow up with thee patient at [phone]   Fax number to the pharmacy is [phone]   Thanks,

## 2019-07-03 ENCOUNTER — Encounter: Payer: Self-pay | Admitting: Family

## 2019-07-03 NOTE — Telephone Encounter (Signed)
Caller Name: aariya, [place] Phone: [phone]  [facility] has her information now and they need signed order faxed for cpap supplies

## 2019-07-04 ENCOUNTER — Encounter: Payer: Self-pay | Admitting: Family

## 2019-07-04 ENCOUNTER — Telehealth: Payer: Self-pay | Admitting: Family

## 2019-07-04 NOTE — Telephone Encounter (Signed)
Supply request form faxed to [employer] with request for download.

## 2019-07-04 NOTE — Telephone Encounter (Signed)
Spoke to [employer] CPAP department, they will fax request form today

## 2019-07-04 NOTE — Telephone Encounter (Signed)
Pt's CPAP setting is as follows:  Autopap 5cmH2O-20 cm H20.  Please request a download report from her homecare agency.

## 2019-07-15 ENCOUNTER — Other Ambulatory Visit: Payer: Self-pay

## 2019-07-15 ENCOUNTER — Ambulatory Visit (INDEPENDENT_AMBULATORY_CARE_PROVIDER_SITE_OTHER): Payer: 59 | Admitting: Internal Medicine

## 2019-07-15 ENCOUNTER — Encounter: Payer: Self-pay | Admitting: Internal Medicine

## 2019-07-15 VITALS — BP 140/90 | HR 82 | Ht 67.5 in | Wt 289.0 lb

## 2019-07-15 DIAGNOSIS — E890 Postprocedural hypothyroidism: Secondary | ICD-10-CM | POA: Diagnosis not present

## 2019-07-15 DIAGNOSIS — C73 Malignant neoplasm of thyroid gland: Secondary | ICD-10-CM | POA: Diagnosis not present

## 2019-07-15 LAB — TSH: TSH: 0.2 u[IU]/mL — ABNORMAL LOW (ref 0.35–4.50)

## 2019-07-15 LAB — T4, FREE: Free T4: 1.14 ng/dL (ref 0.60–1.60)

## 2019-07-15 NOTE — Patient Instructions (Addendum)
Please continue Levothyroxine 200 mcg daily.  Take the thyroid hormone every day, with water, at least 30 minutes before breakfast, separated by at least 4 hours from: - acid reflux medications - calcium - iron - multivitamins  Please stop at the lab.  Please come back for a follow-up appointment in 1 year. 

## 2019-07-15 NOTE — Progress Notes (Signed)
Patient ID: Michelle Kennedy, female   DOB: 1955/12/10, 64 y.o.   MRN: [id number]   This visit occurred during the SARS-CoV-2 public health emergency.  Safety protocols were in place, including screening questions prior to the visit, additional usage of staff PPE, and extensive cleaning of exam room while observing appropriate contact time as indicated for disinfecting solutions.   HPI  Michelle Kennedy is a 64 y.o.-year-old female, returning for follow-up for h/o papillary thyroid cancer and postsurgical hypothyroidism. She saw Dr. Iran Planas before. Last visit with me in 1 year and 10 months ago  Reviewed history: Pt. has been dx with ThyCA in 2002, and had total thyroidectomy then [facility]). No RAI tx 2/2 severe iodine allergy reportedly, no postop U/S's checked.  Neck U/S (12/2016): Complete thyroidectomy without significant abnormality by ultrasound  At last visit, she was on levothyroxine 150 mcg daily, but this was increased by PCP to 200 mcg daily in 05/2018.  She takes the levothyroxine: - in am - fasting - at least 30 min from b'fast -+ Calcium at night - 1-2 Tums at bedtime 2x a week - + MVI at night - stopped PPIs at night - not on Biotin  Reviewed patient's TFTs:: Lab Results  Component Value Date   TSH 2.01 07/05/2018   TSH 6.40 (H) 05/30/2018   TSH 14.60 (H) 05/01/2018   TSH 0.82 09/17/2017   TSH 2.91 03/20/2017   TSH 0.84 12/12/2016   TSH 5.600 (H) 09/20/2016   TSH 0.17 (L) 07/08/2014   TSH 0.084 (L) 09/24/2013   TSH 1.740 04/05/2011   FREET4 1.12 09/17/2017   FREET4 0.87 03/20/2017   FREET4 0.90 12/12/2016   FREET4 1.16 09/20/2016   Prev. Very suppressed TSH levels per review of Care Everywhere records.  Thyroglobulin levels are low but detectable Lab Results  Component Value Date   THYROGLB 0.1 (L) 09/17/2017   THYROGLB 0.1 (L) 12/12/2016   Lab Results  Component Value Date   THGAB <1 09/17/2017   THGAB <1 12/12/2016   10/11/2015: Tg  <0.1, ATA <1  Pt denies: - feeling nodules in neck - hoarseness - dysphagia - choking - SOB with lying down  She has + FH of thyroid disorders in: siblings: goiter. + FH of thyroid cancer: Paternal aunts and uncle. No history of radiation therapy to head or neck . Had RxTx for BrCA (diagnosed in 2016)-but not to the neck.  She was seeing Dr. Dennard Nip in the [facility] >> stopped She has fibromyalgia. She will have a TKR in 09/2017.  ROS: Constitutional: + weight gain/no weight loss, + fatigue, no subjective hyperthermia, no subjective hypothermia Eyes: no blurry vision, no xerophthalmia ENT: no sore throat, + see HPI Cardiovascular: no CP/no SOB/no palpitations/no leg swelling Respiratory: no cough/no SOB/no wheezing Gastrointestinal: no N/no V/no D/no C/no acid reflux Musculoskeletal: no muscle aches/no joint aches Skin: no rashes, no hair loss, + dry skin Neurological: no tremors/no numbness/no tingling/no dizziness  I reviewed pt's medications, allergies, PMH, social hx, family hx, and changes were documented in the history of present illness. Otherwise, unchanged from my initial visit note.   Past Medical History:  Diagnosis Date  . Abnormally small mouth   . Achilles tendon disorder, right   . Anemia   . Anxiety   . Arthritis    hips and knees  . Asthma    daily inhaler, prn inhaler and neb.  . Asthma due to environmental allergies   . Breast  cancer ([place]) 01/2014   left  . Cancer ([place])   . Chest pain   . COPD (chronic obstructive pulmonary disease) ([place])   . Dental bridge present    upper front and lower right  . Dental crowns present    x 3  . Depression   . Esophageal spasm    reports since her chemo and breast surgery  she developed esophageal spasms and reports this is in the past has casue her 02 to desat in the  70s , denies sycnope in relation to this , does report hx of vertigo as well   . Family history of anesthesia complication    twin  brother aspirated and died on OR table, per pt.  . Fibromyalgia   . Gallbladder disease   . GERD (gastroesophageal reflux disease)   . H/O blood clots    had blood to  PICC line   . History of gastric ulcer    as a teenager  . History of seizure age 49   as a reaction to Penicillin - no seizures since  . History of stomach ulcers   . History of thyroid cancer    s/p thyroidectomy  . Hyperlipidemia   . Hypothyroidism   . Joint pain   . Left knee injury   . Liver disorder    seen when she had her hysterectomy , reports she was told it was  " small lesion" ; but asymptomatic   . Lymphedema    left arm  . Migraines   . Multiple food allergies   . Obesity   . OSA (obstructive sleep apnea) 02/16/2016  . Palpitations    reports no longer experiences  . Personal history of chemotherapy 2015  . Personal history of radiation therapy 2015   Left  . Prediabetes   . Rheumatoid arthritis ([place])   . SOB (shortness of breath)   . Swelling of both ankles   . Tinnitus   . UTI (lower urinary tract infection) 02/17/2014  . Vertigo    last episode  was 2 weeks ago   . Vitamin D deficiency   . Wears contact lenses    left eye only   . Wears hearing aid in both ears    Past Surgical History:  Procedure Laterality Date  . ABDOMINAL HYSTERECTOMY  2004   complete  . ACHILLES TENDON REPAIR Right   . APPENDECTOMY  2004  . BREAST EXCISIONAL BIOPSY    . BREAST LUMPECTOMY Left    2015  . BREAST LUMPECTOMY WITH NEEDLE LOCALIZATION AND AXILLARY SENTINEL LYMPH NODE BX Left 02/23/2014   Procedure: BREAST LUMPECTOMY WITH NEEDLE LOCALIZATION AND AXILLARY SENTINEL LYMPH NODE BIOPSY;  Surgeon: Excell Seltzer, MD;  Location: [facility];  Service: General;  Laterality: Left;  . BREAST SURGERY  2011   left breast biposy  . CHOLECYSTECTOMY  1990  . COLONOSCOPY W/ POLYPECTOMY  06/2009  . EYE SURGERY Right 2013   exc. warts from underneath eyelid  . INCONTINENCE SURGERY  2004  . KNEE  ARTHROSCOPY Bilateral    x 6 each knee  . LIGAMENT REPAIR Right    thumb/wrist  . ORIF TOE FRACTURE Right    great toe  . PORTACATH PLACEMENT N/A 03/24/2014   Procedure: INSERTION PORT-A-CATH;  Surgeon: Excell Seltzer, MD;  Location: [facility];  Service: General;  Laterality: N/A;; removed   . THYROIDECTOMY  2000  . TONSILLECTOMY AND ADENOIDECTOMY  2000  . TOTAL KNEE ARTHROPLASTY Left  12/03/2017   Procedure: LEFT TOTAL KNEE ARTHROPLASTY;  Surgeon: Gaynelle Arabian, MD;  Location: [facility];  Service: Orthopedics;  Laterality: Left;  . TUMOR EXCISION     from thoracic spine   Social History   Social History  . Marital status: Married    Spouse name: N/A  . Number of children: 2   Occupational History  . 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 Smoking status: Never Smoker  . Smokeless tobacco: Never Used  . Alcohol use No  . Drug use: No   Social History Narrative   Regular exercise: yes   Current Outpatient Medications on File Prior to Visit  Medication Sig Dispense Refill  . albuterol (PROAIR HFA) 108 (90 Base) MCG/ACT inhaler Inhale 2 puffs into the lungs every 6 (six) hours as needed for wheezing or shortness of breath. INHALE 2 PUFFS INTO THE LUNGS EVERY 6 HOURS AS NEEDED 8 g 0  . albuterol (PROVENTIL) (5 MG/ML) 0.5% nebulizer solution Take 0.5 mLs (2.5 mg total) by nebulization every 6 (six) hours as needed for wheezing or shortness of breath. 40 mL 3  . amLODipine (NORVASC) 10 MG tablet Take 1 tablet (10 mg total) by mouth daily. 90 tablet 3  . anastrozole (ARIMIDEX) 1 MG tablet Take 1 tablet (1 mg total) by mouth daily. 90 tablet 3  . atorvastatin (LIPITOR) 20 MG tablet TAKE 1 TABLET BY MOUTH  DAILY AT 6 PM 90 tablet 1  . benzonatate (TESSALON) 100 MG capsule Take 1 capsule (100 mg total) by mouth 3 (three) times daily as needed for cough. 30 capsule 0  . diphenhydrAMINE (BENADRYL) 25 MG tablet Take 1 tablet an hour before MRI.  Then take 1  tablet at bedtime as needed, per your usual regimen. (Patient taking differently: Take 25 mg by mouth at bedtime as needed for sleep. Take 1 tablet an hour before MRI.  Then take 1 tablet at bedtime as n) 30 tablet 0  . doxycycline (VIBRA-TABS) 100 MG tablet Take 1 tablet (100 mg total) by mouth 2 (two) times daily. Can give caps or generic. 20 tablet 0  . DULoxetine (CYMBALTA) 60 MG capsule TAKE 1 CAPSULE BY MOUTH  DAILY (Patient taking differently: Take 60 mg by mouth daily. ) 90 capsule 1  . EPINEPHrine 0.3 mg/0.3 mL IJ SOAJ injection Inject 0.3 mLs (0.3 mg total) into the muscle once. 2 Device 0  . fluticasone (FLONASE) 50 MCG/ACT nasal spray Place 2 sprays into both nostrils daily. 16 g 1  . Fluticasone-Salmeterol (WIXELA INHUB) 250-50 MCG/DOSE AEPB Inhale 1 puff into the lungs 2 (two) times daily. 180 each 3  . levothyroxine (SYNTHROID) 200 MCG tablet TAKE 1 TABLET BY MOUTH  DAILY BEFORE BREAKFAST 30 tablet 5  . meloxicam (MOBIC) 7.5 MG tablet TAKE 1 TABLET BY MOUTH  DAILY 90 tablet 1  . MYRBETRIQ 50 MG TB24 tablet TAKE 1 TABLET BY MOUTH  DAILY 90 tablet 1  . ondansetron (ZOFRAN) 4 MG tablet Take 1 tablet (4 mg total) by mouth every 8 (eight) hours as needed for nausea or vomiting. 20 tablet 0  . SUMAtriptan (IMITREX) 50 MG tablet Take 1 tablet by mouth at start of migraine, may repeat in 2 hours if not improved. Max 2 tabs/24 hrs 10 tablet 2  . [DISCONTINUED] meloxicam (MOBIC) 7.5 MG tablet TAKE 1 TABLET BY MOUTH  DAILY 90 tablet 1   No current facility-administered medications on file prior to visit.   Allergies  Allergen Reactions  . Bee Venom Anaphylaxis  . Contrast Media [Iodinated Diagnostic Agents] Shortness Of Breath  . Iodine Other (See Comments)    CARDIAC ARREST  . Latex Anaphylaxis and Rash  . Penicillins Shortness Of Breath, Rash and Other (See Comments)    SEIZURE Did it involve swelling of the face/tongue/throat, SOB, or low BP? no Did it involve sudden or severe  rash/hives, skin peeling, or any reaction on the inside of your mouth or nose? yes Did you need to seek medical attention at a hospital or doctor's office? yes When did it last happen?2010 If all above answers are "NO", may proceed with cephalosporin use.   . Shellfish Allergy Shortness Of Breath and Rash  . Aspirin Rash and Other (See Comments)    GI UPSET  . Erythromycin Swelling and Rash    SWELLING OF JOINTS  . Lidocaine Swelling    SWELLING OF MOUTH AND THROAT  . Symbicort [Budesonide-Formoterol Fumarate] Other (See Comments)    BURNING OF TONGUE AND LIPS  . Betadine [Povidone Iodine]     Rash. Breathing problems.   . Codeine Rash  . Compazine [Prochlorperazine Maleate] Rash    Rash on face,chest, arms, back  . Pentazocine Lactate Rash  . Sulfonamide Derivatives Rash   Family History  Problem Relation Age of Onset  . Alcohol abuse Mother   . Arthritis Mother   . Hypertension Mother   . Bipolar disorder Mother   . Breast cancer Mother 12       unconfirmed  . Lung cancer Mother 54       smoker  . Hyperlipidemia Mother   . Stroke Mother   . Cancer Mother   . Depression Mother   . Anxiety disorder Mother   . Alcoholism Mother   . Drug abuse Mother   . Eating disorder Mother   . Obesity Mother   . Alcohol abuse Father   . Cancer Father        lung  . Hyperlipidemia Father   . Kidney disease Father   . Diabetes Father   . Hypertension Father   . Cystic kidney disease Father   . Thyroid disease Father   . Liver disease Father   . Alcoholism Father   . Arthritis Maternal Grandmother   . Diabetes Paternal Grandmother   . Thyroid cancer Sister 24       type?; currently 14  . Other Sister        ovarian tumor @ 72; TAH/BSO  . Breast cancer Sister   . Thyroid cancer Brother        dx 66s; currently 22  . Breast cancer Maternal Aunt        dx 70s; deceased 21  . Thyroid cancer Paternal Aunt        All 3 paternal aunts with thyroid ca in 30s/40s  . Lung  cancer Paternal Aunt        2 of 3 paternal aunts with lung cancer    PE: BP 140/90   Pulse 82   Ht 5' 7.5" (1.715 m)   Wt 289 lb (131.1 kg)   SpO2 96%   BMI 44.60 kg/m  Wt Readings from Last 3 Encounters:  07/15/19 289 lb (131.1 kg)  03/31/19 281 lb 12.8 oz (127.8 kg)  01/29/19 278 lb (126.1 kg)   Constitutional: overweight, in NAD Eyes: PERRLA, EOMI, no exophthalmos ENT: moist mucous membranes, no neck masses, no cervical lymphadenopathy Cardiovascular: RRR, No MRG, + mild pitting  edema B legs Respiratory: CTA B Gastrointestinal: abdomen soft, NT, ND, BS+ Musculoskeletal: no deformities, strength intact in all 4 Skin: moist, warm, no rashes Neurological: + slight tremor with outstretched L hand, DTR normal in all 4   ASSESSMENT: 1. H/O PTC  2. Hypothyroidism  PLAN:  1. PTC -I do not have records; she did not have RAI treatment because of allergy to iodine. -She had previous thyroglobulin levels that were undetectable, as were her ATA antibodies, per records reviewed when she was seeing Dr. Tamala Julian. At last visit, thyroglobulin was detectable but very low, at 0.1, stable.  We discussed that this is not uncommon secondary to acid variability and also due to the fact that she did not have RAI treatment.  - We will recheck thyroglobulin and ATA at this visit. - we reviewed the latest neck ultrasound from 12/2016: No metastases or recurrences in the neck  2. Patient with longstanding postsurgical hypothyroidism, on levothyroxine treatment. - latest thyroid labs reviewed with pt >> normal: Lab Results  Component Value Date   TSH 2.01 07/05/2018   - she continues on LT4 200 mcg daily, dose increased by PCP 05/2018, when TSH was increased - pt feels good on this dose. - we discussed about taking the thyroid hormone every day, with water, >30 minutes before breakfast, separated by >4 hours from acid reflux medications, calcium, iron, multivitamins. Pt. is taking it correctly. -  will check thyroid tests today: TSH and fT4 - If labs are abnormal, she will need to return for repeat TFTs in 1.5 months -If labs are normal, I will see her back in a year  Needs 3 mo supply to mail order  Component     Latest Ref Rng & Units 07/15/2019  Thyroglobulin     ng/mL 0.3 (L)  Comment        T4,Free(Direct)     0.60 - 1.60 ng/dL 1.14  TSH     0.35 - 4.50 uIU/mL 0.20 (L)  Thyroglobulin Ab     < or = 1 IU/mL <1  Her TSH is suppressed.  We will need to back off the levothyroxine to 175 mcg daily and recheck her test in 1.5 months. Also, her thyroglobulin is higher.  I would like to repeat this at next lab draw in 1.5 months.  If still high, will need a neck ultrasound.   Philemon Kingdom, MD PhD [facility] End[facility]

## 2019-07-16 LAB — THYROGLOBULIN LEVEL: Thyroglobulin: 0.3 ng/mL — ABNORMAL LOW

## 2019-07-16 LAB — THYROGLOBULIN ANTIBODY: Thyroglobulin Ab: <1 IU/mL (ref <=1)

## 2019-07-18 MED ORDER — LEVOTHYROXINE SODIUM 175 MCG PO TABS: 175.0000 ug | ORAL_TABLET | Freq: Every day | ORAL | 3 refills | Status: DC

## 2019-07-20 ENCOUNTER — Other Ambulatory Visit: Payer: Self-pay | Admitting: Hematology and Oncology

## 2019-07-21 ENCOUNTER — Encounter: Payer: Self-pay | Admitting: Hematology and Oncology

## 2019-07-22 ENCOUNTER — Encounter: Payer: Self-pay | Admitting: Hematology and Oncology

## 2019-07-23 ENCOUNTER — Other Ambulatory Visit: Payer: Self-pay | Admitting: *Deleted

## 2019-07-23 MED ORDER — ANASTROZOLE 1 MG PO TABS: 1.0000 mg | ORAL_TABLET | Freq: Every day | ORAL | 3 refills | Status: DC

## 2019-07-24 ENCOUNTER — Encounter: Payer: Self-pay | Admitting: Hematology and Oncology

## 2019-07-25 ENCOUNTER — Encounter: Payer: Self-pay | Admitting: Hematology and Oncology

## 2019-07-25 DIAGNOSIS — C50412 Malignant neoplasm of upper-outer quadrant of left female breast: Secondary | ICD-10-CM

## 2019-07-28 ENCOUNTER — Other Ambulatory Visit: Payer: Self-pay

## 2019-07-28 ENCOUNTER — Ambulatory Visit: Payer: 59 | Attending: Hematology and Oncology

## 2019-07-28 DIAGNOSIS — G8929 Other chronic pain: Secondary | ICD-10-CM | POA: Insufficient documentation

## 2019-07-28 DIAGNOSIS — M25512 Pain in left shoulder: Secondary | ICD-10-CM | POA: Diagnosis present

## 2019-07-28 DIAGNOSIS — I890 Lymphedema, not elsewhere classified: Secondary | ICD-10-CM | POA: Diagnosis present

## 2019-07-28 DIAGNOSIS — R293 Abnormal posture: Secondary | ICD-10-CM | POA: Diagnosis present

## 2019-07-28 DIAGNOSIS — M25612 Stiffness of left shoulder, not elsewhere classified: Secondary | ICD-10-CM

## 2019-07-28 DIAGNOSIS — D0512 Intraductal carcinoma in situ of left breast: Secondary | ICD-10-CM | POA: Diagnosis not present

## 2019-07-28 NOTE — Therapy (Addendum)
[place], [place], [address] Phone: [phone]   Fax:  [phone]  Physical Therapy Evaluation  Patient Details  Name: Michelle Kennedy MRN: [id number] Date of Birth: January 17, 1956 Referring Provider (PT): Nicholas Lose MD   Encounter Date: 07/28/2019  PT End of Session - 07/28/19 1656     Visit Number  1    Number of Visits  13    Date for PT Re-Evaluation  09/01/19    PT Start Time  1600    PT Stop Time  8889    PT Time Calculation (min)  43 min    Activity Tolerance  Patient tolerated treatment well    Behavior During Therapy  [facility] for tasks assessed/performed        Past Medical History:  Diagnosis Date   Abnormally small mouth    Achilles tendon disorder, right    Anemia    Anxiety    Arthritis    hips and knees   Asthma    daily inhaler, prn inhaler and neb.   Asthma due to environmental allergies    Breast cancer ([place]) 01/2014   left   Cancer [facility])    Chest pain    COPD (chronic obstructive pulmonary disease) (HCC)    Dental bridge present    upper front and lower right   Dental crowns present    x 3   Depression    Esophageal spasm    reports since her chemo and breast surgery  she developed esophageal spasms and reports this is in the past has casue her 02 to desat in the  70s , denies sycnope in relation to this , does report hx of vertigo as well    Family history of anesthesia complication    twin brother aspirated and died on OR table, per pt.   Fibromyalgia    Gallbladder disease    GERD (gastroesophageal reflux disease)    H/O blood clots    had blood to  PICC line    History of gastric ulcer    as a teenager   History of seizure age 14   as a reaction to Penicillin - no seizures since   History of stomach ulcers    History of thyroid cancer    s/p thyroidectomy   Hyperlipidemia    Hypothyroidism    Joint pain    Left knee injury    Liver disorder    seen  when she had her hysterectomy , reports she was told it was  " small lesion" ; but asymptomatic    Lymphedema    left arm   Migraines    Multiple food allergies    Obesity    OSA (obstructive sleep apnea) 02/16/2016   Palpitations    reports no longer experiences   Personal history of chemotherapy 2015   Personal history of radiation therapy 2015   Left   Prediabetes    Rheumatoid arthritis (HCC)    SOB (shortness of breath)    Swelling of both ankles    Tinnitus    UTI (lower urinary tract infection) 02/17/2014   Vertigo    last episode  was 2 weeks ago    Vitamin D deficiency    Wears contact lenses    left eye only    Wears hearing aid in both ears     Past Surgical History:  Procedure Laterality Date   ABDOMINAL HYSTERECTOMY  2004   complete  ACHILLES TENDON REPAIR Right    APPENDECTOMY  2004   BREAST EXCISIONAL BIOPSY     BREAST LUMPECTOMY Left    2015   BREAST LUMPECTOMY WITH NEEDLE LOCALIZATION AND AXILLARY SENTINEL LYMPH NODE BX Left 02/23/2014   Procedure: BREAST LUMPECTOMY WITH NEEDLE LOCALIZATION AND AXILLARY SENTINEL LYMPH NODE BIOPSY;  Surgeon: Excell Seltzer, MD;  Location: [facility];  Service: General;  Laterality: Left;   BREAST SURGERY  2011   left breast biposy   CHOLECYSTECTOMY  1990   COLONOSCOPY W/ POLYPECTOMY  06/2009   EYE SURGERY Right 2013   exc. warts from underneath eyelid   INCONTINENCE SURGERY  2004   KNEE ARTHROSCOPY Bilateral    x 6 each knee   LIGAMENT REPAIR Right    thumb/wrist   ORIF TOE FRACTURE Right    great toe   PORTACATH PLACEMENT N/A 03/24/2014   Procedure: INSERTION PORT-A-CATH;  Surgeon: Excell Seltzer, MD;  Location: [place] [facility];  Service: General;  Laterality: N/A;; removed    THYROIDECTOMY  2000   TONSILLECTOMY AND ADENOIDECTOMY  2000   TOTAL KNEE ARTHROPLASTY Left 12/03/2017   Procedure: LEFT TOTAL KNEE ARTHROPLASTY;  Surgeon: Gaynelle Arabian, MD;  Location: [facility];  Service:  Orthopedics;  Laterality: Left;   TUMOR EXCISION     from thoracic spine    There were no vitals filed for this visit.   Subjective Assessment - 07/28/19 1607     Subjective  Pt reports that she had breast cancer surgery in 2015 an radiation that she finished in 2016. She has had swelling off an on since that time. In the past few months she has noticed increased edema in her LUE that has made it difficult to don a jacket. She previously has been to physical therapy and had a sleeve but is unable to wear it at this time due to she has gained some weight. Pt reports that she is has been knitting more than usual due to she is expecting a new grand baby.    Pertinent History  History of lumpectomy with SLNB on 02/23/2014, Hx DVT    Patient Stated Goals  I want to keep my arm from getting any bigger and maintain my arm size so it doesn't end up full of fluid.    Currently in Pain?  Yes    Pain Score  4     Pain Location  Arm    Pain Orientation  Left    Pain Descriptors / Indicators  Discomfort    Pain Type  Chronic pain    Pain Onset  More than a month ago    Pain Frequency  Constant    Aggravating Factors   nothing    Pain Relieving Factors  nothing    Effect of Pain on Daily Activities  pt states that her LUE feels weaker when she is trying to pick up something in that hand.          [facility] PT Assessment - 07/28/19 0001       Assessment   Medical Diagnosis  L UE lymphdema     Referring Provider (PT)  Nicholas Lose MD    Onset Date/Surgical Date  02/23/14    Hand Dominance  Left    Prior Therapy  yes back in 2015/2016      Precautions   Precautions  Other (comment)   hx DVT in the UE, Lumpectomy and radiation due to cancer     Restrictions  Weight Bearing Restrictions  No      Balance Screen   Has the patient fallen in the past 6 months  No    Has the patient had a decrease in activity level because of a fear of falling?   No    Is the patient reluctant to leave their  home because of a fear of falling?   No      Home Environment   Living Environment  Private residence    Living Arrangements  Spouse/significant other    Type of [place] Access  Level entry    Home Layout  One level      Prior Function   Level of [place]  Retired    Leisure  Knit/Sew      Cognition   Overall Cognitive Status  Within Functional Limits for tasks assessed      Posture/Postural Control   Posture/Postural Control  Postural limitations    Postural Limitations  Rounded Shoulders      ROM / Strength   AROM / PROM / Strength  AROM      AROM   AROM Assessment Site  Shoulder    Right/Left Shoulder  Right;Left    Right Shoulder Flexion  147 Degrees    Right Shoulder ABduction  153 Degrees    Right Shoulder Internal Rotation  72 Degrees    Right Shoulder External Rotation  72 Degrees    Left Shoulder Flexion  132 Degrees    Left Shoulder ABduction  121 Degrees    Left Shoulder Internal Rotation  67 Degrees    Left Shoulder External Rotation  73 Degrees         LYMPHEDEMA/ONCOLOGY QUESTIONNAIRE - 07/28/19 1620       Type   Cancer Type  L breast cancer      Surgeries   Lumpectomy Date  02/23/14    Sentinel Lymph Node Biopsy Date  02/23/14    Number Lymph Nodes Removed  1      Treatment   Active Chemotherapy Treatment  No    Past Chemotherapy Treatment  Yes    Active Radiation Treatment  No    Past Radiation Treatment  Yes    Body Site  L breast/axilla    Current Hormone Treatment  Yes    Drug Name  Anastrozole      What other symptoms do you have   Are you Having Heaviness or Tightness  Yes    Are you having Pain  Yes    Are you having pitting edema  No    Is it Hard or Difficult finding clothes that fit  Yes    Do you have infections  No    Is there Decreased scar mobility  Yes      Lymphedema Stage   Stage  STAGE 1 SPONTANEOUSLY REVERSIBLE      Lymphedema Assessments   Lymphedema Assessments  Upper  extremities      Right Upper Extremity Lymphedema   15 cm Proximal to Olecranon Process  40.3 cm    10 cm Proximal to Olecranon Process  36.5 cm    Olecranon Process  29.5 cm    15 cm Proximal to Ulnar Styloid Process  30.4 cm    10 cm Proximal to Ulnar Styloid Process  28.4 cm    Just Proximal to Ulnar Styloid Process  21 cm    Across Hand at [employer]  Space  21.9 cm    At [place] of 2nd Digit  7.5 cm      Left Upper Extremity Lymphedema   15 cm Proximal to Olecranon Process  41 cm    10 cm Proximal to Olecranon Process  38 cm    Olecranon Process  31.7 cm    15 cm Proximal to Ulnar Styloid Process  32.5 cm    10 cm Proximal to Ulnar Styloid Process  30.2 cm    Just Proximal to Ulnar Styloid Process  22.9 cm    Across Hand at [employer]  20.9 cm    At [place] of 2nd Digit  7.7 cm              Outpatient Rehab from 07/28/2019 [facility]  Lymphedema Life Impact Scale Total Score  57.35 %       Objective measurements completed on examination: See above findings.              PT Education - 07/28/19 1654     Education Details  Pt was educated on lymphedema and that with radiation/lumpectomy she should only expect lymphedema in the L upper quadrant. Discussed possible edema related to surgery in the abdomen from gallbladder emergency. She was referred back to her PCP for work up prior to beginning physical therapy related to possible flare up of lymphedema related to swelling that is not related to her cancer treatment/surgery. Dr. Loleta Dicker and NP Debbrah Alar were both contacted in regards to the patient prior to continueing with physical therapy. Discussed all of this with the patient.    Person(s) Educated  Patient    Methods  Explanation    Comprehension  Verbalized understanding        PT Short Term Goals - 07/28/19 1701       PT SHORT TERM GOAL #1   Title  Pt will be independent with initial HEP and with MLD for  autonomy of care.    Baseline  pt is not performing HEP or MLD    Time  2    Period  Weeks    Status  New    Target Date  09/01/19         PT Long Term Goals - 07/28/19 1701       PT LONG TERM GOAL #1   Title  Pt will have appropriate compression garment for the abdomen and LUE in order to manage edema at home.    Baseline  Pt currently cannot fit her compression sleeve.    Time  4    Period  Weeks    Status  New    Target Date  09/01/19      PT LONG TERM GOAL #2   Title  Pt will improve L shoulder ROM to 140 degrees L shoulder flexion and abduction in order to demonstrate improved functional mobility.    Baseline  L shoulder: 132 flexion, 121 abduction    Time  4    Period  Weeks    Status  New    Target Date  09/01/19      PT LONG TERM GOAL #3   Title  Pt will have 1-2 cm or greater decrease in circumferential measurements in the proximal antebrachium to distal brachium in order to demonstrate decrease fluid in the LUE.    Baseline  see measurements.    Time  4    Period  Weeks    Status  New    Target Date  09/01/19      PT LONG TERM GOAL #4   Title  Pt will demonstrate improved posture with minimal to no cueing when seated with improves scapular activation in order to decrease stress on the L shoulder joint.    Baseline  Pt has significant anteriorly rolled Bil shoulders.    Time  4    Period  Weeks    Status  New    Target Date  09/01/19      PT LONG TERM GOAL #5   Title  Pt will have LLIS score of 35% or less to demonstrate improved home management and quality of life with lymphedema.    Baseline  57.35% LLIS score    Time  4    Period  Weeks    Status  New    Target Date  09/01/19              Plan - 07/28/19 1656     Clinical Impression Statement  Pt presents to physical therapy with increased circumferential measurements in her LUE compared to her RUE and decreased shoulder ROM of her L shoulder compared to her R. On further evaluation it  became evident that pt is demonstration edema in her B UE with L>R, and in her abdomen with fluid greater in the R side than the L. She has had a previous abdominal surgery due to emergency gall baldder removal that could be contributing to fluid in the abdomen but due to increase in fluid over the past 3 months pt was recommended to follow up with her PCP prior to beginning physical therapy services. Pt will benefit from skilled physical therapy services unless otherwise specified by MD 3x/week for 4 weeks in order to address increased fluid in her LUE and decreased shoulder ROM in order to prevent hospitalization related to immobility and infection.    Personal Factors and Comorbidities  Comorbidity 1    Comorbidities  L lumpectomy/radiation    Stability/Clinical Decision Making  Evolving/Moderate complexity    Clinical Decision Making  Moderate    Rehab Potential  Good    PT Frequency  3x / week    PT Duration  4 weeks    PT Treatment/Interventions  Therapeutic activities;Therapeutic exercise;Neuromuscular re-education;Patient/family education;Manual techniques    PT Next Visit Plan  If pt is cleared by PCP, begin MLD and wrapping, work on postural strengthening to prevent anteriorly rolled shoulders and work on ROM of the L shoulder. Possible myofascial release and STM. Work on MLD of the entire trunk.    Consulted and Agree with Plan of Care  Patient        Patient will benefit from skilled therapeutic intervention in order to improve the following deficits and impairments:  Decreased range of motion, Pain, Increased edema, Postural dysfunction  Visit Diagnosis: Ductal carcinoma in situ (DCIS) of left breast  Lymphedema, not elsewhere classified  Stiffness of left shoulder, not elsewhere classified  Abnormal posture  Chronic left shoulder pain     Problem List Patient Active Problem List   Diagnosis Date Noted   OA (osteoarthritis) of knee 12/03/2017   Class 3 obesity without  serious comorbidity with body mass index (BMI) of 40.0 to 44.9 in adult 01/01/2017   Essential hypertension 10/18/2016   Constipation 10/04/2016   Vitamin D deficiency 10/04/2016   Prediabetes 10/04/2016   Morbid obesity ([place]) 09/20/2016   Other fatigue 09/20/2016   OSA (obstructive sleep apnea) 02/16/2016  Right ankle pain 01/20/2016   Strain of right Achilles tendon 11/30/2015   Benign hypertension 10/11/2015   Malignant neoplasm of thyroid gland ([place]) 10/11/2015   De Quervain's tenosynovitis, bilateral 09/15/2015   Right shoulder pain 09/15/2015   Trigger finger, acquired 06/22/2015   Left wrist pain 06/22/2015   Thumb injury 06/14/2015   Chemotherapy-induced peripheral neuropathy ([place]) 05/10/2015   Postmenopausal osteoporosis 12/08/2014   Preventative health care 12/01/2014   DVT (deep venous thrombosis) ([place]) 08/28/2014   Lymphedema of arm 08/28/2014   Mucositis due to chemotherapy 06/02/2014   UTI symptoms 02/17/2014   Menopausal syndrome (hot flashes) 12/31/2013   Breast cancer of upper-outer quadrant of left female breast ([place]) 12/26/2013   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   Gingivitis 12/05/2013   Seasonal allergies 09/24/2013   Skin lesion 06/03/2013   Menopause 06/02/2013   Elevated blood-pressure reading without diagnosis of hypertension 03/05/2013   Cough 07/24/2012   Left knee injury 11/02/2011   Gout 09/19/2011   Hyperlipidemia 06/02/2011   Migraine 06/02/2011   Tinnitus of both ears 02/14/2011   Left knee pain 09/23/2010   FIBROMYALGIA 05/10/2010   Asthma 03/22/2010   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 Patient goals were not met. Patient is being discharged due to not returning since the last visit.  Allyson Sabal [place], [place] 08/31/21 9:03 AM

## 2019-07-29 ENCOUNTER — Telehealth: Payer: Self-pay

## 2019-07-29 ENCOUNTER — Telehealth: Payer: Self-pay | Admitting: Family

## 2019-07-29 NOTE — Telephone Encounter (Signed)
RN returned call, no answer.  Left voicemail acknowledging she was seen at the lymphedema clinic.  Contact information left for patient if she had any further questions or concerns.

## 2019-07-29 NOTE — Telephone Encounter (Signed)
I was contacted by the physical therapist who saw her about therapist's concern that pt has significant edema. Could you please contact pt to schedule an in person visit with me?

## 2019-07-29 NOTE — Telephone Encounter (Signed)
Patient was scheduled for tomorrow at 12:20

## 2019-07-30 ENCOUNTER — Ambulatory Visit (INDEPENDENT_AMBULATORY_CARE_PROVIDER_SITE_OTHER): Payer: 59 | Admitting: Family

## 2019-07-30 ENCOUNTER — Encounter: Payer: Self-pay | Admitting: Family

## 2019-07-30 ENCOUNTER — Other Ambulatory Visit: Payer: Self-pay

## 2019-07-30 VITALS — BP 162/88 | HR 73 | Temp 96.6°F | Resp 16 | Ht 66.0 in | Wt 287.0 lb

## 2019-07-30 DIAGNOSIS — G43909 Migraine, unspecified, not intractable, without status migrainosus: Secondary | ICD-10-CM

## 2019-07-30 DIAGNOSIS — I10 Essential (primary) hypertension: Secondary | ICD-10-CM | POA: Diagnosis not present

## 2019-07-30 DIAGNOSIS — R609 Edema, unspecified: Secondary | ICD-10-CM

## 2019-07-30 DIAGNOSIS — J45909 Unspecified asthma, uncomplicated: Secondary | ICD-10-CM | POA: Diagnosis not present

## 2019-07-30 DIAGNOSIS — Z5181 Encounter for therapeutic drug level monitoring: Secondary | ICD-10-CM | POA: Diagnosis not present

## 2019-07-30 DIAGNOSIS — B351 Tinea unguium: Secondary | ICD-10-CM

## 2019-07-30 DIAGNOSIS — N3281 Overactive bladder: Secondary | ICD-10-CM

## 2019-07-30 LAB — HEPATIC FUNCTION PANEL
ALT: 50 U/L — ABNORMAL HIGH (ref 0–35)
AST: 27 U/L (ref 0–37)
Albumin: 4 g/dL (ref 3.5–5.2)
Alkaline Phosphatase: 83 U/L (ref 39–117)
Bilirubin, Direct: 0.1 mg/dL (ref 0.0–0.3)
Total Bilirubin: 0.5 mg/dL (ref 0.2–1.2)
Total Protein: 6.5 g/dL (ref 6.0–8.3)

## 2019-07-30 LAB — COMPREHENSIVE METABOLIC PANEL
ALT: 50 U/L — ABNORMAL HIGH (ref 0–35)
AST: 27 U/L (ref 0–37)
Albumin: 4 g/dL (ref 3.5–5.2)
Alkaline Phosphatase: 83 U/L (ref 39–117)
BUN: 10 mg/dL (ref 6–23)
CO2: 27 mEq/L (ref 19–32)
Calcium: 9.6 mg/dL (ref 8.4–10.5)
Chloride: 105 mEq/L (ref 96–112)
Creatinine, Ser: 0.81 mg/dL (ref 0.40–1.20)
GFR: 70.94 mL/min (ref >60.00)
Glucose, Bld: 97 mg/dL (ref 70–99)
Potassium: 4 mEq/L (ref 3.5–5.1)
Sodium: 138 mEq/L (ref 135–145)
Total Bilirubin: 0.5 mg/dL (ref 0.2–1.2)
Total Protein: 6.5 g/dL (ref 6.0–8.3)

## 2019-07-30 LAB — BRAIN NATRIURETIC PEPTIDE: Pro B Natriuretic peptide (BNP): 51 pg/mL (ref 0.0–100.0)

## 2019-07-30 MED ORDER — LISINOPRIL 10 MG PO TABS: ORAL_TABLET | ORAL | 3 refills | Status: DC

## 2019-07-30 MED ORDER — TERBINAFINE HCL 250 MG PO TABS: 250.0000 mg | ORAL_TABLET | Freq: Every day | ORAL | 0 refills | Status: DC

## 2019-07-30 MED ORDER — AMLODIPINE BESYLATE 10 MG PO TABS: 5.0000 mg | ORAL_TABLET | Freq: Every day | ORAL | 3 refills | Status: DC

## 2019-07-30 NOTE — Progress Notes (Addendum)
Subjective:    Patient ID: Michelle Kennedy, female    DOB: [birth year], 64 y.o.   MRN: [id number]  HPI  Patient is a 64 yr old female who presents today after seeing her physical therapist who had concern about generalized edema. Pt reports that she first noticed swelling in January. Reports that she is walking 30 min a day.  Usually wears ted hose. Reports that she feels exhausted.  Wt Readings from Last 3 Encounters:  07/30/19 287 lb (130.2 kg)  07/15/19 289 lb (131.1 kg)  03/31/19 281 lb 12.8 oz (127.8 kg)   Toenail problem- reports "fungus" of the left great toe.  HTN- Patient is maintained on amlodipine 10mg .  BP Readings from Last 3 Encounters:  07/30/19 (!) 162/88  07/15/19 140/90  03/31/19 (!) 155/89   Asthma- maintained on wixella. Reports that she wheezes when she walks.    OAB- maintained on myrbetriq.  She reports that she will have frequent urinary incontinence.  Migraines- uses imitrex PRN. Reports not recent migraines  OA- uses meloxicam.   Review of Systems See HPI  Past Medical History:  Diagnosis Date  . Abnormally small mouth   . Achilles tendon disorder, right   . Anemia   . Anxiety   . Arthritis    hips and knees  . Asthma    daily inhaler, prn inhaler and neb.  . Asthma due to environmental allergies   . Breast cancer ([place]) 01/2014   left  . Cancer ([place])   . Chest pain   . COPD (chronic obstructive pulmonary disease) ([place])   . Dental bridge present    upper front and lower right  . Dental crowns present    x 3  . Depression   . Esophageal spasm    reports since her chemo and breast surgery  she developed esophageal spasms and reports this is in the past has casue her 02 to desat in the  70s , denies sycnope in relation to this , does report hx of vertigo as well   . Family history of anesthesia complication    twin brother aspirated and died on OR table, per pt.  . Fibromyalgia   . Gallbladder disease   . GERD (gastroesophageal reflux  disease)   . H/O blood clots    had blood to  PICC line   . History of gastric ulcer    as a teenager  . History of seizure age 76   as a reaction to Penicillin - no seizures since  . History of stomach ulcers   . History of thyroid cancer    s/p thyroidectomy  . Hyperlipidemia   . Hypothyroidism   . Joint pain   . Left knee injury   . Liver disorder    seen when she had her hysterectomy , reports she was told it was  " small lesion" ; but asymptomatic   . Lymphedema    left arm  . Migraines   . Multiple food allergies   . Obesity   . OSA (obstructive sleep apnea) 02/16/2016  . Palpitations    reports no longer experiences  . Personal history of chemotherapy 2015  . Personal history of radiation therapy 2015   Left  . Prediabetes   . Rheumatoid arthritis ([place])   . SOB (shortness of breath)   . Swelling of both ankles   . Tinnitus   . UTI (lower urinary tract infection) 02/17/2014  . Vertigo    last episode  was 2 weeks ago   . Vitamin D deficiency   . Wears contact lenses    left eye only   . Wears hearing aid in both ears      Social History   Socioeconomic History  . Marital status: Married    Spouse name: Not on file  . Number of children: Not on file  . Years of education: Not on file  . Highest education level: Not on file  Occupational History  . 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 Smoking status: Never Smoker  . Smokeless tobacco: Never Used  Substance and Sexual Activity  . Alcohol use: No    Alcohol/week: 0.0 standard drinks  . Drug use: No  . Sexual activity: Not Currently    Partners: Male    Comment: menarche age 89, P 2, first birth age 55, menopause age 19, Premarin x 10 yrs  Other Topics Concern  . Not on file  Social History Narrative   Regular exercise: yes      Social Determinants of Health   Financial Resource Strain:   . Difficulty of Paying Living Expenses: Not on file  Food Insecurity:   .  Worried About Charity fundraiser in the Last Year: Not on file  . Ran Out of Food in the Last Year: Not on file  Transportation Needs:   . Lack of Transportation (Medical): Not on file  . Lack of Transportation (Non-Medical): Not on file  Physical Activity:   . Days of Exercise per Week: Not on file  . Minutes of Exercise per Session: Not on file  Stress:   . Feeling of Stress : Not on file  Social Connections:   . Frequency of Communication with Friends and Family: Not on file  . Frequency of Social Gatherings with Friends and Family: Not on file  . Attends Religious Services: Not on file  . Active Member of Clubs or Organizations: Not on file  . Attends Archivist Meetings: Not on file  . Marital Status: Not on file  Intimate Partner Violence:   . Fear of Current or Ex-Partner: Not on file  . Emotionally Abused: Not on file  . Physically Abused: Not on file  . Sexually Abused: Not on file    Past Surgical History:  Procedure Laterality Date  . ABDOMINAL HYSTERECTOMY  2004   complete  . ACHILLES TENDON REPAIR Right   . APPENDECTOMY  2004  . BREAST EXCISIONAL BIOPSY    . BREAST LUMPECTOMY Left    2015  . BREAST LUMPECTOMY WITH NEEDLE LOCALIZATION AND AXILLARY SENTINEL LYMPH NODE BX Left 02/23/2014   Procedure: BREAST LUMPECTOMY WITH NEEDLE LOCALIZATION AND AXILLARY SENTINEL LYMPH NODE BIOPSY;  Surgeon: Excell Seltzer, MD;  Location: [facility];  Service: General;  Laterality: Left;  . BREAST SURGERY  2011   left breast biposy  . CHOLECYSTECTOMY  1990  . COLONOSCOPY W/ POLYPECTOMY  06/2009  . EYE SURGERY Right 2013   exc. warts from underneath eyelid  . INCONTINENCE SURGERY  2004  . KNEE ARTHROSCOPY Bilateral    x 6 each knee  . LIGAMENT REPAIR Right    thumb/wrist  . ORIF TOE FRACTURE Right    great toe  . PORTACATH PLACEMENT N/A 03/24/2014   Procedure: INSERTION PORT-A-CATH;  Surgeon: Excell Seltzer, MD;  Location: [facility];  Service: General;  Laterality: N/A;; removed   . THYROIDECTOMY  2000  .  TONSILLECTOMY AND ADENOIDECTOMY  2000  . TOTAL KNEE ARTHROPLASTY Left 12/03/2017   Procedure: LEFT TOTAL KNEE ARTHROPLASTY;  Surgeon: Gaynelle Arabian, MD;  Location: [facility];  Service: Orthopedics;  Laterality: Left;  . TUMOR EXCISION     from thoracic spine    Family History  Problem Relation Age of Onset  . Alcohol abuse Mother   . Arthritis Mother   . Hypertension Mother   . Bipolar disorder Mother   . Breast cancer Mother 51       unconfirmed  . Lung cancer Mother 51       smoker  . Hyperlipidemia Mother   . Stroke Mother   . Cancer Mother   . Depression Mother   . Anxiety disorder Mother   . Alcoholism Mother   . Drug abuse Mother   . Eating disorder Mother   . Obesity Mother   . Alcohol abuse Father   . Cancer Father        lung  . Hyperlipidemia Father   . Kidney disease Father   . Diabetes Father   . Hypertension Father   . Cystic kidney disease Father   . Thyroid disease Father   . Liver disease Father   . Alcoholism Father   . Arthritis Maternal Grandmother   . Diabetes Paternal Grandmother   . Thyroid cancer Sister 74       type?; currently 51  . Other Sister        ovarian tumor @ 63; TAH/BSO  . Breast cancer Sister   . Thyroid cancer Brother        dx 94s; currently 38  . Breast cancer Maternal Aunt        dx 21s; deceased 68  . Thyroid cancer Paternal Aunt        All 3 paternal aunts with thyroid ca in 30s/40s  . Lung cancer Paternal Aunt        2 of 3 paternal aunts with lung cancer    Allergies  Allergen Reactions  . Bee Venom Anaphylaxis  . Contrast Media [Iodinated Diagnostic Agents] Shortness Of Breath  . Iodine Other (See Comments)    CARDIAC ARREST  . Latex Anaphylaxis and Rash  . Penicillins Shortness Of Breath, Rash and Other (See Comments)    SEIZURE Did it involve swelling of the face/tongue/throat, SOB, or low BP? no Did it involve sudden or severe  rash/hives, skin peeling, or any reaction on the inside of your mouth or nose? yes Did you need to seek medical attention at a hospital or doctor's office? yes When did it last happen?2010 If all above answers are "NO", may proceed with cephalosporin use.   . Shellfish Allergy Shortness Of Breath and Rash  . Aspirin Rash and Other (See Comments)    GI UPSET  . Erythromycin Swelling and Rash    SWELLING OF JOINTS  . Lidocaine Swelling    SWELLING OF MOUTH AND THROAT  . Symbicort [Budesonide-Formoterol Fumarate] Other (See Comments)    BURNING OF TONGUE AND LIPS  . Betadine [Povidone Iodine]     Rash. Breathing problems.   . Codeine Rash  . Compazine [Prochlorperazine Maleate] Rash    Rash on face,chest, arms, back  . Pentazocine Lactate Rash  . Sulfonamide Derivatives Rash    Current Outpatient Medications on File Prior to Visit  Medication Sig Dispense Refill  . albuterol (PROAIR HFA) 108 (90 Base) MCG/ACT inhaler Inhale 2 puffs into the lungs every 6 (six) hours as needed  for wheezing or shortness of breath. INHALE 2 PUFFS INTO THE LUNGS EVERY 6 HOURS AS NEEDED 8 g 0  . albuterol (PROVENTIL) (5 MG/ML) 0.5% nebulizer solution Take 0.5 mLs (2.5 mg total) by nebulization every 6 (six) hours as needed for wheezing or shortness of breath. 40 mL 3  . amLODipine (NORVASC) 10 MG tablet Take 1 tablet (10 mg total) by mouth daily. 90 tablet 3  . anastrozole (ARIMIDEX) 1 MG tablet Take 1 tablet (1 mg total) by mouth daily. 90 tablet 3  . atorvastatin (LIPITOR) 20 MG tablet TAKE 1 TABLET BY MOUTH  DAILY AT 6 PM 90 tablet 1  . benzonatate (TESSALON) 100 MG capsule Take 1 capsule (100 mg total) by mouth 3 (three) times daily as needed for cough. 30 capsule 0  . diphenhydrAMINE (BENADRYL) 25 MG tablet Take 1 tablet an hour before MRI.  Then take 1 tablet at bedtime as needed, per your usual regimen. (Patient taking differently: Take 25 mg by mouth at bedtime as needed for sleep. Take 1 tablet  an hour before MRI.  Then take 1 tablet at bedtime as n) 30 tablet 0  . doxycycline (VIBRA-TABS) 100 MG tablet Take 1 tablet (100 mg total) by mouth 2 (two) times daily. Can give caps or generic. 20 tablet 0  . DULoxetine (CYMBALTA) 60 MG capsule TAKE 1 CAPSULE BY MOUTH  DAILY (Patient taking differently: Take 60 mg by mouth daily. ) 90 capsule 1  . EPINEPHrine 0.3 mg/0.3 mL IJ SOAJ injection Inject 0.3 mLs (0.3 mg total) into the muscle once. 2 Device 0  . fluticasone (FLONASE) 50 MCG/ACT nasal spray Place 2 sprays into both nostrils daily. 16 g 1  . Fluticasone-Salmeterol (WIXELA INHUB) 250-50 MCG/DOSE AEPB Inhale 1 puff into the lungs 2 (two) times daily. 180 each 3  . levothyroxine (SYNTHROID) 175 MCG tablet Take 1 tablet (175 mcg total) by mouth daily before breakfast. 45 tablet 3  . meloxicam (MOBIC) 7.5 MG tablet TAKE 1 TABLET BY MOUTH  DAILY 90 tablet 1  . MYRBETRIQ 50 MG TB24 tablet TAKE 1 TABLET BY MOUTH  DAILY 90 tablet 1  . ondansetron (ZOFRAN) 4 MG tablet Take 1 tablet (4 mg total) by mouth every 8 (eight) hours as needed for nausea or vomiting. 20 tablet 0  . SUMAtriptan (IMITREX) 50 MG tablet Take 1 tablet by mouth at start of migraine, may repeat in 2 hours if not improved. Max 2 tabs/24 hrs 10 tablet 2  . [DISCONTINUED] meloxicam (MOBIC) 7.5 MG tablet TAKE 1 TABLET BY MOUTH  DAILY 90 tablet 1   No current facility-administered medications on file prior to visit.    BP (!) 162/88 (BP Location: Right Arm, Patient Position: Sitting, Cuff Size: Large)   Pulse 73   Temp (!) 96.6 F (35.9 C) (Temporal)   Resp 16   Ht 5\' 6"  (1.676 m)   Wt 287 lb (130.2 kg)   SpO2 99%   BMI 46.32 kg/m       Objective:   Physical Exam Constitutional:      Appearance: She is well-developed.  Neck:     Thyroid: No thyromegaly.  Cardiovascular:     Rate and Rhythm: Normal rate and regular rhythm.     Heart sounds: Normal heart sounds. No murmur.     Comments: Some swelling noted in  bilateral upper extremities. Pulmonary:     Effort: Pulmonary effort is normal. No respiratory distress.     Breath sounds: Normal breath  sounds. No wheezing.  Abdominal:     General: Bowel sounds are normal.     Palpations: Abdomen is soft.     Tenderness: There is no abdominal tenderness.  Musculoskeletal:     Cervical back: Neck supple.     Right lower leg: 3+ Edema present.     Left lower leg: 3+ Edema present.  Skin:    General: Skin is warm and dry.     Comments: Hypertrophic, discolored right great toenail  Neurological:     Mental Status: She is alert and oriented to person, place, and time.  Psychiatric:        Behavior: Behavior normal.        Thought Content: Thought content normal.        Judgment: Judgment normal.           Assessment & Plan:  Peripheral edema-I think that her amlodipine could be contributing.  Her blood pressure is elevated today.  See adjustments below.  She did have an echocardiogram in 2019 which showed normal left ventricular ejection fraction.  I will check a BMP today  Hypertension-will decrease amlodipine from 10 mg to 5 mg and add lisinopril 10 mg once daily.  She will check blood pressure once daily and is advised to call me if BP greater than 160/100.  Asthma-uncontrolled, using her albuterol frequently and having wheezing daily.  Continue Wixela and as needed albuterol.  Will refer to pulmonology for further evaluation.  Overactive bladder-uncontrolled.  She has previous history of bladder suspension with mesh.  I would like to refer her back to urology for further evaluation.  Continue Myrbetriq for now.  Migraine headaches-currently stable.  Has Imitrex on hand for as needed use.  Onychomycosis-will repeat Lamisil once daily.  She is advised that she will need to complete a follow-up LFTs in 1 month.  This visit occurred during the SARS-CoV-2 public health emergency.  Safety protocols were in place, including screening questions  prior to the visit, additional usage of staff PPE, and extensive cleaning of exam room while observing appropriate contact time as indicated for disinfecting solutions.

## 2019-07-30 NOTE — Patient Instructions (Addendum)
Decrease amlodipine to 5mg  once daily. Add lisinopril 10mg  once daily for blood pressure. Check pressure once daily. Call if BP >160/100. Stop meloxicam.

## 2019-07-31 ENCOUNTER — Encounter: Payer: Self-pay | Admitting: Family

## 2019-08-01 ENCOUNTER — Ambulatory Visit: Payer: 59 | Admitting: Family

## 2019-08-06 ENCOUNTER — Other Ambulatory Visit: Payer: Self-pay

## 2019-08-06 ENCOUNTER — Ambulatory Visit (INDEPENDENT_AMBULATORY_CARE_PROVIDER_SITE_OTHER): Payer: 59 | Admitting: Family

## 2019-08-06 ENCOUNTER — Encounter: Payer: Self-pay | Admitting: Family

## 2019-08-06 VITALS — BP 146/85 | HR 81 | Temp 96.2°F | Resp 16 | Wt 288.0 lb

## 2019-08-06 DIAGNOSIS — I10 Essential (primary) hypertension: Secondary | ICD-10-CM

## 2019-08-06 DIAGNOSIS — R609 Edema, unspecified: Secondary | ICD-10-CM

## 2019-08-06 LAB — BASIC METABOLIC PANEL
BUN: 12 mg/dL (ref 6–23)
CO2: 31 mEq/L (ref 19–32)
Calcium: 9.7 mg/dL (ref 8.4–10.5)
Chloride: 103 mEq/L (ref 96–112)
Creatinine, Ser: 0.8 mg/dL (ref 0.40–1.20)
GFR: 71.96 mL/min (ref >60.00)
Glucose, Bld: 97 mg/dL (ref 70–99)
Potassium: 4.3 mEq/L (ref 3.5–5.1)
Sodium: 139 mEq/L (ref 135–145)

## 2019-08-06 MED ORDER — FUROSEMIDE 20 MG PO TABS: 20.0000 mg | ORAL_TABLET | Freq: Every day | ORAL | 3 refills | Status: DC | PRN

## 2019-08-06 MED ORDER — LISINOPRIL 10 MG PO TABS: 30.0000 mg | ORAL_TABLET | Freq: Every day | ORAL | 3 refills | Status: DC

## 2019-08-06 NOTE — Progress Notes (Addendum)
Subjective:     Patient ID: Michelle Kennedy, female   DOB: 03-01-1956, 64 y.o.   MRN: [id number]  HPI  Patient is a 64 yr old female who presents today for follow up.  Edema- reports slight improvement since last visit.   HTN- Last visit we stopped amlodipine and added lisinopril. She is currently on 20mg  of lisinopril.    BP Readings from Last 3 Encounters:  08/06/19 (!) 146/85  07/30/19 (!) 162/88  07/15/19 140/90   Asthma- referred to pulmonology last visit. Has not heard back.   OAB- was referred to urology last visit.   Home BP readings- see media tab.  Review of Systems See HPI  Past Medical History:  Diagnosis Date  . Abnormally small mouth   . Achilles tendon disorder, right   . Anemia   . Anxiety   . Arthritis    hips and knees  . Asthma    daily inhaler, prn inhaler and neb.  . Asthma due to environmental allergies   . Breast cancer ([place]) 01/2014   left  . Cancer ([place])   . Chest pain   . COPD (chronic obstructive pulmonary disease) ([place])   . Dental bridge present    upper front and lower right  . Dental crowns present    x 3  . Depression   . Esophageal spasm    reports since her chemo and breast surgery  she developed esophageal spasms and reports this is in the past has casue her 02 to desat in the  70s , denies sycnope in relation to this , does report hx of vertigo as well   . Family history of anesthesia complication    twin brother aspirated and died on OR table, per pt.  . Fibromyalgia   . Gallbladder disease   . GERD (gastroesophageal reflux disease)   . H/O blood clots    had blood to  PICC line   . History of gastric ulcer    as a teenager  . History of seizure age 47   as a reaction to Penicillin - no seizures since  . History of stomach ulcers   . History of thyroid cancer    s/p thyroidectomy  . Hyperlipidemia   . Hypothyroidism   . Joint pain   . Left knee injury   . Liver disorder    seen when she had her hysterectomy ,  reports she was told it was  " small lesion" ; but asymptomatic   . Lymphedema    left arm  . Migraines   . Multiple food allergies   . Obesity   . OSA (obstructive sleep apnea) 02/16/2016  . Palpitations    reports no longer experiences  . Personal history of chemotherapy 2015  . Personal history of radiation therapy 2015   Left  . Prediabetes   . Rheumatoid arthritis ([place])   . SOB (shortness of breath)   . Swelling of both ankles   . Tinnitus   . UTI (lower urinary tract infection) 02/17/2014  . Vertigo    last episode  was 2 weeks ago   . Vitamin D deficiency   . Wears contact lenses    left eye only   . Wears hearing aid in both ears      Social History   Socioeconomic History  . Marital status: Married    Spouse name: Not on file  . Number of children: Not on file  . Years of education: Not  on file  . Highest education level: Not on file  Occupational History  . 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 Smoking status: Never Smoker  . Smokeless tobacco: Never Used  Substance and Sexual Activity  . Alcohol use: No    Alcohol/week: 0.0 standard drinks  . Drug use: No  . Sexual activity: Not Currently    Partners: Male    Comment: menarche age 59, P 2, first birth age 67, menopause age 39, Premarin x 10 yrs  Other Topics Concern  . Not on file  Social History Narrative   Regular exercise: yes      Social Determinants of Health   Financial Resource Strain:   . Difficulty of Paying Living Expenses:   Food Insecurity:   . Worried About Charity fundraiser in the Last Year:   . Arboriculturist in the Last Year:   Transportation Needs:   . Film/video editor (Medical):   Marland Kitchen Lack of Transportation (Non-Medical):   Physical Activity:   . Days of Exercise per Week:   . Minutes of Exercise per Session:   Stress:   . Feeling of Stress :   Social Connections:   . Frequency of Communication with Friends and Family:   . Frequency of  Social Gatherings with Friends and Family:   . Attends Religious Services:   . Active Member of Clubs or Organizations:   . Attends Archivist Meetings:   Marland Kitchen Marital Status:   Intimate Partner Violence:   . Fear of Current or Ex-Partner:   . Emotionally Abused:   Marland Kitchen Physically Abused:   . Sexually Abused:     Past Surgical History:  Procedure Laterality Date  . ABDOMINAL HYSTERECTOMY  2004   complete  . ACHILLES TENDON REPAIR Right   . APPENDECTOMY  2004  . BREAST EXCISIONAL BIOPSY    . BREAST LUMPECTOMY Left    2015  . BREAST LUMPECTOMY WITH NEEDLE LOCALIZATION AND AXILLARY SENTINEL LYMPH NODE BX Left 02/23/2014   Procedure: BREAST LUMPECTOMY WITH NEEDLE LOCALIZATION AND AXILLARY SENTINEL LYMPH NODE BIOPSY;  Surgeon: Excell Seltzer, MD;  Location: [facility];  Service: General;  Laterality: Left;  . BREAST SURGERY  2011   left breast biposy  . CHOLECYSTECTOMY  1990  . COLONOSCOPY W/ POLYPECTOMY  06/2009  . EYE SURGERY Right 2013   exc. warts from underneath eyelid  . INCONTINENCE SURGERY  2004  . KNEE ARTHROSCOPY Bilateral    x 6 each knee  . LIGAMENT REPAIR Right    thumb/wrist  . ORIF TOE FRACTURE Right    great toe  . PORTACATH PLACEMENT N/A 03/24/2014   Procedure: INSERTION PORT-A-CATH;  Surgeon: Excell Seltzer, MD;  Location: [facility];  Service: General;  Laterality: N/A;; removed   . THYROIDECTOMY  2000  . TONSILLECTOMY AND ADENOIDECTOMY  2000  . TOTAL KNEE ARTHROPLASTY Left 12/03/2017   Procedure: LEFT TOTAL KNEE ARTHROPLASTY;  Surgeon: Gaynelle Arabian, MD;  Location: [facility];  Service: Orthopedics;  Laterality: Left;  . TUMOR EXCISION     from thoracic spine    Family History  Problem Relation Age of Onset  . Alcohol abuse Mother   . Arthritis Mother   . Hypertension Mother   . Bipolar disorder Mother   . Breast cancer Mother 70       unconfirmed  . Lung cancer Mother 6       smoker  .  Hyperlipidemia Mother   .  Stroke Mother   . Cancer Mother   . Depression Mother   . Anxiety disorder Mother   . Alcoholism Mother   . Drug abuse Mother   . Eating disorder Mother   . Obesity Mother   . Alcohol abuse Father   . Cancer Father        lung  . Hyperlipidemia Father   . Kidney disease Father   . Diabetes Father   . Hypertension Father   . Cystic kidney disease Father   . Thyroid disease Father   . Liver disease Father   . Alcoholism Father   . Arthritis Maternal Grandmother   . Diabetes Paternal Grandmother   . Thyroid cancer Sister 87       type?; currently 71  . Other Sister        ovarian tumor @ 88; TAH/BSO  . Breast cancer Sister   . Thyroid cancer Brother        dx 73s; currently 72  . Breast cancer Maternal Aunt        dx 45s; deceased 62  . Thyroid cancer Paternal Aunt        All 3 paternal aunts with thyroid ca in 30s/40s  . Lung cancer Paternal Aunt        2 of 3 paternal aunts with lung cancer    Allergies  Allergen Reactions  . Bee Venom Anaphylaxis  . Contrast Media [Iodinated Diagnostic Agents] Shortness Of Breath  . Iodine Other (See Comments)    CARDIAC ARREST  . Latex Anaphylaxis and Rash  . Penicillins Shortness Of Breath, Rash and Other (See Comments)    SEIZURE Did it involve swelling of the face/tongue/throat, SOB, or low BP? no Did it involve sudden or severe rash/hives, skin peeling, or any reaction on the inside of your mouth or nose? yes Did you need to seek medical attention at a hospital or doctor's office? yes When did it last happen?2010 If all above answers are "NO", may proceed with cephalosporin use.   . Shellfish Allergy Shortness Of Breath and Rash  . Aspirin Rash and Other (See Comments)    GI UPSET  . Erythromycin Swelling and Rash    SWELLING OF JOINTS  . Lidocaine Swelling    SWELLING OF MOUTH AND THROAT  . Symbicort [Budesonide-Formoterol Fumarate] Other (See Comments)    BURNING OF TONGUE AND LIPS  . Betadine [Povidone Iodine]      Rash. Breathing problems.   . Codeine Rash  . Compazine [Prochlorperazine Maleate] Rash    Rash on face,chest, arms, back  . Pentazocine Lactate Rash  . Sulfonamide Derivatives Rash    Current Outpatient Medications on File Prior to Visit  Medication Sig Dispense Refill  . albuterol (PROAIR HFA) 108 (90 Base) MCG/ACT inhaler Inhale 2 puffs into the lungs every 6 (six) hours as needed for wheezing or shortness of breath. INHALE 2 PUFFS INTO THE LUNGS EVERY 6 HOURS AS NEEDED 8 g 0  . albuterol (PROVENTIL) (5 MG/ML) 0.5% nebulizer solution Take 0.5 mLs (2.5 mg total) by nebulization every 6 (six) hours as needed for wheezing or shortness of breath. 40 mL 3  . anastrozole (ARIMIDEX) 1 MG tablet Take 1 tablet (1 mg total) by mouth daily. 90 tablet 3  . atorvastatin (LIPITOR) 20 MG tablet TAKE 1 TABLET BY MOUTH  DAILY AT 6 PM 90 tablet 1  . DULoxetine (CYMBALTA) 60 MG capsule TAKE 1 CAPSULE BY MOUTH  DAILY (  Patient taking differently: Take 60 mg by mouth daily. ) 90 capsule 1  . EPINEPHrine 0.3 mg/0.3 mL IJ SOAJ injection Inject 0.3 mLs (0.3 mg total) into the muscle once. 2 Device 0  . fluticasone (FLONASE) 50 MCG/ACT nasal spray Place 2 sprays into both nostrils daily. 16 g 1  . Fluticasone-Salmeterol (WIXELA INHUB) 250-50 MCG/DOSE AEPB Inhale 1 puff into the lungs 2 (two) times daily. 180 each 3  . levothyroxine (SYNTHROID) 175 MCG tablet Take 1 tablet (175 mcg total) by mouth daily before breakfast. 45 tablet 3  . lisinopril (ZESTRIL) 10 MG tablet Take 1 tablet by mouth once daily 30 tablet 3  . MYRBETRIQ 50 MG TB24 tablet TAKE 1 TABLET BY MOUTH  DAILY 90 tablet 1  . ondansetron (ZOFRAN) 4 MG tablet Take 1 tablet (4 mg total) by mouth every 8 (eight) hours as needed for nausea or vomiting. 20 tablet 0  . SUMAtriptan (IMITREX) 50 MG tablet Take 1 tablet by mouth at start of migraine, may repeat in 2 hours if not improved. Max 2 tabs/24 hrs 10 tablet 2  . terbinafine (LAMISIL) 250 MG tablet  Take 1 tablet (250 mg total) by mouth daily. 30 tablet 0  . [DISCONTINUED] meloxicam (MOBIC) 7.5 MG tablet TAKE 1 TABLET BY MOUTH  DAILY 90 tablet 1   No current facility-administered medications on file prior to visit.    BP (!) 146/85 (BP Location: Right Arm, Patient Position: Sitting, Cuff Size: Large)   Pulse 81   Temp (!) 96.2 F (35.7 C) (Temporal)   Resp 16   Wt 288 lb (130.6 kg)   SpO2 100%   BMI 46.48 kg/m       Objective:   Physical Exam Constitutional:      Appearance: She is well-developed.  Neck:     Thyroid: No thyromegaly.  Cardiovascular:     Rate and Rhythm: Normal rate and regular rhythm.     Heart sounds: Normal heart sounds. No murmur.  Pulmonary:     Effort: Pulmonary effort is normal. No respiratory distress.     Breath sounds: Normal breath sounds. No wheezing.  Musculoskeletal:     Cervical back: Neck supple.     Right lower leg: Edema present.     Left lower leg: Edema present.     Comments: Mild swelling of bilateral upper extremities  Skin:    General: Skin is warm and dry.  Neurological:     Mental Status: She is alert and oriented to person, place, and time.  Psychiatric:        Behavior: Behavior normal.        Thought Content: Thought content normal.        Judgment: Judgment normal.        Assessment:     HTN- bp improving but still above goal. Increase lisinopril from 20mg  to 30mg . Check follow up bmet today.  Edema- trial of lasix 1 tab once daily prn.  Plan to re-evaluate her in 1 week.     Plan:

## 2019-08-06 NOTE — Patient Instructions (Addendum)
Please call [facility] to schedule your appointment- [phone] Please call [facility] to schedule your appointment.  [phone] Add furosemide 20mg  once daily as needed for swelling. Increase lisinopril to 3 tabs once daily.

## 2019-08-13 ENCOUNTER — Other Ambulatory Visit: Payer: Self-pay

## 2019-08-13 ENCOUNTER — Ambulatory Visit (INDEPENDENT_AMBULATORY_CARE_PROVIDER_SITE_OTHER): Payer: 59 | Admitting: Family

## 2019-08-13 VITALS — BP 139/84 | HR 80 | Temp 97.8°F | Resp 16 | Wt 289.0 lb

## 2019-08-13 DIAGNOSIS — Z23 Encounter for immunization: Secondary | ICD-10-CM

## 2019-08-13 DIAGNOSIS — I10 Essential (primary) hypertension: Secondary | ICD-10-CM

## 2019-08-13 DIAGNOSIS — R609 Edema, unspecified: Secondary | ICD-10-CM

## 2019-08-13 LAB — BASIC METABOLIC PANEL
BUN: 10 mg/dL (ref 6–23)
CO2: 29 mEq/L (ref 19–32)
Calcium: 10.1 mg/dL (ref 8.4–10.5)
Chloride: 102 mEq/L (ref 96–112)
Creatinine, Ser: 0.79 mg/dL (ref 0.40–1.20)
GFR: 73.01 mL/min (ref >60.00)
Glucose, Bld: 100 mg/dL — ABNORMAL HIGH (ref 70–99)
Potassium: 4.3 mEq/L (ref 3.5–5.1)
Sodium: 139 mEq/L (ref 135–145)

## 2019-08-13 NOTE — Progress Notes (Signed)
Subjective:    Patient ID: Michelle Kennedy, female    DOB: 09-01-1955, 64 y.o.   MRN: [id number]  HPI  Patient is a 64 yr old female who presents today for follow up.  HTN- last visit we increased lisinopril from 20mg  to 30mg . BP Readings from Last 3 Encounters:  08/13/19 139/84  08/06/19 (!) 146/85  07/30/19 (!) 162/88    Peripheral edema- last visit we gave a trial of lasix 20mg  once daily prn. She reports some improvement in her edema since starting lasix.   Review of Systems    see HPI  Past Medical History:  Diagnosis Date  . Abnormally small mouth   . Achilles tendon disorder, right   . Anemia   . Anxiety   . Arthritis    hips and knees  . Asthma    daily inhaler, prn inhaler and neb.  . Asthma due to environmental allergies   . Breast cancer ([place]) 01/2014   left  . Cancer ([place])   . Chest pain   . COPD (chronic obstructive pulmonary disease) ([place])   . Dental bridge present    upper front and lower right  . Dental crowns present    x 3  . Depression   . Esophageal spasm    reports since her chemo and breast surgery  she developed esophageal spasms and reports this is in the past has casue her 02 to desat in the  70s , denies sycnope in relation to this , does report hx of vertigo as well   . Family history of anesthesia complication    twin brother aspirated and died on OR table, per pt.  . Fibromyalgia   . Gallbladder disease   . GERD (gastroesophageal reflux disease)   . H/O blood clots    had blood to  PICC line   . History of gastric ulcer    as a teenager  . History of seizure age 44   as a reaction to Penicillin - no seizures since  . History of stomach ulcers   . History of thyroid cancer    s/p thyroidectomy  . Hyperlipidemia   . Hypothyroidism   . Joint pain   . Left knee injury   . Liver disorder    seen when she had her hysterectomy , reports she was told it was  " small lesion" ; but asymptomatic   . Lymphedema    left arm  .  Migraines   . Multiple food allergies   . Obesity   . OSA (obstructive sleep apnea) 02/16/2016  . Palpitations    reports no longer experiences  . Personal history of chemotherapy 2015  . Personal history of radiation therapy 2015   Left  . Prediabetes   . Rheumatoid arthritis ([place])   . SOB (shortness of breath)   . Swelling of both ankles   . Tinnitus   . UTI (lower urinary tract infection) 02/17/2014  . Vertigo    last episode  was 2 weeks ago   . Vitamin D deficiency   . Wears contact lenses    left eye only   . Wears hearing aid in both ears      Social History   Socioeconomic History  . Marital status: Married    Spouse name: Not on file  . Number of children: Not on file  . Years of education: Not on file  . Highest education level: Not on file  Occupational History  .  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 Smoking status: Never Smoker  . Smokeless tobacco: Never Used  Substance and Sexual Activity  . Alcohol use: No    Alcohol/week: 0.0 standard drinks  . Drug use: No  . Sexual activity: Not Currently    Partners: Male    Comment: menarche age 65, P 2, first birth age 61, menopause age 76, Premarin x 10 yrs  Other Topics Concern  . Not on file  Social History Narrative   Regular exercise: yes      Social Determinants of Health   Financial Resource Strain:   . Difficulty of Paying Living Expenses:   Food Insecurity:   . Worried About Charity fundraiser in the Last Year:   . Arboriculturist in the Last Year:   Transportation Needs:   . Film/video editor (Medical):   Marland Kitchen Lack of Transportation (Non-Medical):   Physical Activity:   . Days of Exercise per Week:   . Minutes of Exercise per Session:   Stress:   . Feeling of Stress :   Social Connections:   . Frequency of Communication with Friends and Family:   . Frequency of Social Gatherings with Friends and Family:   . Attends Religious Services:   . Active Member of  Clubs or Organizations:   . Attends Archivist Meetings:   Marland Kitchen Marital Status:   Intimate Partner Violence:   . Fear of Current or Ex-Partner:   . Emotionally Abused:   Marland Kitchen Physically Abused:   . Sexually Abused:     Past Surgical History:  Procedure Laterality Date  . ABDOMINAL HYSTERECTOMY  2004   complete  . ACHILLES TENDON REPAIR Right   . APPENDECTOMY  2004  . BREAST EXCISIONAL BIOPSY    . BREAST LUMPECTOMY Left    2015  . BREAST LUMPECTOMY WITH NEEDLE LOCALIZATION AND AXILLARY SENTINEL LYMPH NODE BX Left 02/23/2014   Procedure: BREAST LUMPECTOMY WITH NEEDLE LOCALIZATION AND AXILLARY SENTINEL LYMPH NODE BIOPSY;  Surgeon: Excell Seltzer, MD;  Location: [facility];  Service: General;  Laterality: Left;  . BREAST SURGERY  2011   left breast biposy  . CHOLECYSTECTOMY  1990  . COLONOSCOPY W/ POLYPECTOMY  06/2009  . EYE SURGERY Right 2013   exc. warts from underneath eyelid  . INCONTINENCE SURGERY  2004  . KNEE ARTHROSCOPY Bilateral    x 6 each knee  . LIGAMENT REPAIR Right    thumb/wrist  . ORIF TOE FRACTURE Right    great toe  . PORTACATH PLACEMENT N/A 03/24/2014   Procedure: INSERTION PORT-A-CATH;  Surgeon: Excell Seltzer, MD;  Location: [facility];  Service: General;  Laterality: N/A;; removed   . THYROIDECTOMY  2000  . TONSILLECTOMY AND ADENOIDECTOMY  2000  . TOTAL KNEE ARTHROPLASTY Left 12/03/2017   Procedure: LEFT TOTAL KNEE ARTHROPLASTY;  Surgeon: Gaynelle Arabian, MD;  Location: [facility];  Service: Orthopedics;  Laterality: Left;  . TUMOR EXCISION     from thoracic spine    Family History  Problem Relation Age of Onset  . Alcohol abuse Mother   . Arthritis Mother   . Hypertension Mother   . Bipolar disorder Mother   . Breast cancer Mother 85       unconfirmed  . Lung cancer Mother 41       smoker  . Hyperlipidemia Mother   . Stroke Mother   . Cancer Mother   .  Depression Mother   . Anxiety disorder Mother   .  Alcoholism Mother   . Drug abuse Mother   . Eating disorder Mother   . Obesity Mother   . Alcohol abuse Father   . Cancer Father        lung  . Hyperlipidemia Father   . Kidney disease Father   . Diabetes Father   . Hypertension Father   . Cystic kidney disease Father   . Thyroid disease Father   . Liver disease Father   . Alcoholism Father   . Arthritis Maternal Grandmother   . Diabetes Paternal Grandmother   . Thyroid cancer Sister 34       type?; currently 72  . Other Sister        ovarian tumor @ 55; TAH/BSO  . Breast cancer Sister   . Thyroid cancer Brother        dx 25s; currently 21  . Breast cancer Maternal Aunt        dx 25s; deceased 82  . Thyroid cancer Paternal Aunt        All 3 paternal aunts with thyroid ca in 30s/40s  . Lung cancer Paternal Aunt        2 of 3 paternal aunts with lung cancer    Allergies  Allergen Reactions  . Bee Venom Anaphylaxis  . Contrast Media [Iodinated Diagnostic Agents] Shortness Of Breath  . Iodine Other (See Comments)    CARDIAC ARREST  . Latex Anaphylaxis and Rash  . Penicillins Shortness Of Breath, Rash and Other (See Comments)    SEIZURE Did it involve swelling of the face/tongue/throat, SOB, or low BP? no Did it involve sudden or severe rash/hives, skin peeling, or any reaction on the inside of your mouth or nose? yes Did you need to seek medical attention at a hospital or doctor's office? yes When did it last happen?2010 If all above answers are "NO", may proceed with cephalosporin use.   . Shellfish Allergy Shortness Of Breath and Rash  . Aspirin Rash and Other (See Comments)    GI UPSET  . Erythromycin Swelling and Rash    SWELLING OF JOINTS  . Lidocaine Swelling    SWELLING OF MOUTH AND THROAT  . Symbicort [Budesonide-Formoterol Fumarate] Other (See Comments)    BURNING OF TONGUE AND LIPS  . Betadine [Povidone Iodine]     Rash. Breathing problems.   . Codeine Rash  . Compazine [Prochlorperazine  Maleate] Rash    Rash on face,chest, arms, back  . Pentazocine Lactate Rash  . Sulfonamide Derivatives Rash    Current Outpatient Medications on File Prior to Visit  Medication Sig Dispense Refill  . albuterol (PROAIR HFA) 108 (90 Base) MCG/ACT inhaler Inhale 2 puffs into the lungs every 6 (six) hours as needed for wheezing or shortness of breath. INHALE 2 PUFFS INTO THE LUNGS EVERY 6 HOURS AS NEEDED 8 g 0  . albuterol (PROVENTIL) (5 MG/ML) 0.5% nebulizer solution Take 0.5 mLs (2.5 mg total) by nebulization every 6 (six) hours as needed for wheezing or shortness of breath. 40 mL 3  . anastrozole (ARIMIDEX) 1 MG tablet Take 1 tablet (1 mg total) by mouth daily. 90 tablet 3  . atorvastatin (LIPITOR) 20 MG tablet TAKE 1 TABLET BY MOUTH  DAILY AT 6 PM 90 tablet 1  . DULoxetine (CYMBALTA) 60 MG capsule TAKE 1 CAPSULE BY MOUTH  DAILY (Patient taking differently: Take 60 mg by mouth daily. ) 90 capsule 1  .  EPINEPHrine 0.3 mg/0.3 mL IJ SOAJ injection Inject 0.3 mLs (0.3 mg total) into the muscle once. 2 Device 0  . fluticasone (FLONASE) 50 MCG/ACT nasal spray Place 2 sprays into both nostrils daily. 16 g 1  . Fluticasone-Salmeterol (WIXELA INHUB) 250-50 MCG/DOSE AEPB Inhale 1 puff into the lungs 2 (two) times daily. 180 each 3  . furosemide (LASIX) 20 MG tablet Take 1 tablet (20 mg total) by mouth daily as needed for edema. 30 tablet 3  . levothyroxine (SYNTHROID) 175 MCG tablet Take 1 tablet (175 mcg total) by mouth daily before breakfast. 45 tablet 3  . lisinopril (ZESTRIL) 10 MG tablet Take 3 tablets (30 mg total) by mouth daily. Take 1 tablet by mouth once daily 90 tablet 3  . MYRBETRIQ 50 MG TB24 tablet TAKE 1 TABLET BY MOUTH  DAILY 90 tablet 1  . ondansetron (ZOFRAN) 4 MG tablet Take 1 tablet (4 mg total) by mouth every 8 (eight) hours as needed for nausea or vomiting. 20 tablet 0  . SUMAtriptan (IMITREX) 50 MG tablet Take 1 tablet by mouth at start of migraine, may repeat in 2 hours if not  improved. Max 2 tabs/24 hrs 10 tablet 2  . terbinafine (LAMISIL) 250 MG tablet Take 1 tablet (250 mg total) by mouth daily. 30 tablet 0  . [DISCONTINUED] meloxicam (MOBIC) 7.5 MG tablet TAKE 1 TABLET BY MOUTH  DAILY 90 tablet 1   No current facility-administered medications on file prior to visit.    BP 139/84 (BP Location: Right Arm, Patient Position: Sitting, Cuff Size: Large)   Pulse 80   Temp 97.8 F (36.6 C) (Temporal)   Resp 16   Wt 289 lb (131.1 kg)   SpO2 98%   BMI 46.65 kg/m    Objective:   Physical Exam Constitutional:      Appearance: She is well-developed.  Neck:     Thyroid: No thyromegaly.  Cardiovascular:     Rate and Rhythm: Normal rate and regular rhythm.     Heart sounds: Normal heart sounds. No murmur.     Comments: Trace bilateral UE edema Pulmonary:     Effort: Pulmonary effort is normal. No respiratory distress.     Breath sounds: Normal breath sounds. No wheezing.  Musculoskeletal:     Cervical back: Neck supple.     Right lower leg: 1+ Edema present.     Left lower leg: 1+ Edema present.  Skin:    General: Skin is warm and dry.  Neurological:     Mental Status: She is alert and oriented to person, place, and time.  Psychiatric:        Behavior: Behavior normal.        Thought Content: Thought content normal.        Judgment: Judgment normal.           Assessment & Plan:  Edema- some improvement.  Continue lasix. Obtain follow up bmet.  HTN- bp improved, continue current dose of lisinopril. Obtain follow up bmet.

## 2019-08-14 ENCOUNTER — Encounter: Payer: Self-pay | Admitting: Family

## 2019-08-18 ENCOUNTER — Other Ambulatory Visit: Payer: Self-pay

## 2019-08-18 ENCOUNTER — Encounter: Payer: Self-pay | Admitting: Family

## 2019-08-18 DIAGNOSIS — F1990 Other psychoactive substance use, unspecified, uncomplicated: Secondary | ICD-10-CM

## 2019-08-18 MED ORDER — LISINOPRIL 10 MG PO TABS: 30.0000 mg | ORAL_TABLET | Freq: Every day | ORAL | 3 refills | Status: DC

## 2019-08-18 MED ORDER — TERBINAFINE HCL 250 MG PO TABS: 250.0000 mg | ORAL_TABLET | Freq: Every day | ORAL | 1 refills | Status: DC

## 2019-08-18 NOTE — Progress Notes (Signed)
Advised CMA to cancel lamisil rx. Needs LFT's prior to additional 2 month refill.

## 2019-08-22 ENCOUNTER — Encounter (HOSPITAL_BASED_OUTPATIENT_CLINIC_OR_DEPARTMENT_OTHER): Payer: Self-pay | Admitting: Emergency Medicine

## 2019-08-22 ENCOUNTER — Observation Stay (HOSPITAL_BASED_OUTPATIENT_CLINIC_OR_DEPARTMENT_OTHER)
Admission: EM | Admit: 2019-08-22 | Discharge: 2019-08-23 | Disposition: A | Discharge status: Home / Self Care | Payer: 59 | Attending: Internal Medicine | Admitting: Internal Medicine

## 2019-08-22 ENCOUNTER — Emergency Department (HOSPITAL_BASED_OUTPATIENT_CLINIC_OR_DEPARTMENT_OTHER): Payer: 59

## 2019-08-22 ENCOUNTER — Emergency Department (HOSPITAL_COMMUNITY): Payer: 59

## 2019-08-22 ENCOUNTER — Other Ambulatory Visit: Payer: Self-pay

## 2019-08-22 DIAGNOSIS — H349 Unspecified retinal vascular occlusion: Secondary | ICD-10-CM | POA: Diagnosis not present

## 2019-08-22 DIAGNOSIS — Z96652 Presence of left artificial knee joint: Secondary | ICD-10-CM | POA: Insufficient documentation

## 2019-08-22 DIAGNOSIS — Z86718 Personal history of other venous thrombosis and embolism: Secondary | ICD-10-CM | POA: Insufficient documentation

## 2019-08-22 DIAGNOSIS — Z882 Allergy status to sulfonamides status: Secondary | ICD-10-CM | POA: Diagnosis not present

## 2019-08-22 DIAGNOSIS — I110 Hypertensive heart disease with heart failure: Secondary | ICD-10-CM | POA: Insufficient documentation

## 2019-08-22 DIAGNOSIS — G4733 Obstructive sleep apnea (adult) (pediatric): Secondary | ICD-10-CM | POA: Diagnosis not present

## 2019-08-22 DIAGNOSIS — Z791 Long term (current) use of non-steroidal anti-inflammatories (NSAID): Secondary | ICD-10-CM | POA: Insufficient documentation

## 2019-08-22 DIAGNOSIS — Z9114 Patient's other noncompliance with medication regimen: Secondary | ICD-10-CM | POA: Insufficient documentation

## 2019-08-22 DIAGNOSIS — J449 Chronic obstructive pulmonary disease, unspecified: Secondary | ICD-10-CM | POA: Diagnosis not present

## 2019-08-22 DIAGNOSIS — R519 Headache, unspecified: Secondary | ICD-10-CM

## 2019-08-22 DIAGNOSIS — Z888 Allergy status to other drugs, medicaments and biological substances status: Secondary | ICD-10-CM | POA: Insufficient documentation

## 2019-08-22 DIAGNOSIS — Z79899 Other long term (current) drug therapy: Secondary | ICD-10-CM | POA: Insufficient documentation

## 2019-08-22 DIAGNOSIS — Z881 Allergy status to other antibiotic agents status: Secondary | ICD-10-CM | POA: Diagnosis not present

## 2019-08-22 DIAGNOSIS — Z7951 Long term (current) use of inhaled steroids: Secondary | ICD-10-CM | POA: Diagnosis not present

## 2019-08-22 DIAGNOSIS — E785 Hyperlipidemia, unspecified: Secondary | ICD-10-CM | POA: Diagnosis not present

## 2019-08-22 DIAGNOSIS — Z885 Allergy status to narcotic agent status: Secondary | ICD-10-CM | POA: Insufficient documentation

## 2019-08-22 DIAGNOSIS — Z79890 Hormone replacement therapy (postmenopausal): Secondary | ICD-10-CM | POA: Insufficient documentation

## 2019-08-22 DIAGNOSIS — Z91148 Patient's other noncompliance with medication regimen for other reason: Secondary | ICD-10-CM

## 2019-08-22 DIAGNOSIS — Z20822 Contact with and (suspected) exposure to covid-19: Secondary | ICD-10-CM | POA: Insufficient documentation

## 2019-08-22 DIAGNOSIS — R9082 White matter disease, unspecified: Secondary | ICD-10-CM | POA: Insufficient documentation

## 2019-08-22 DIAGNOSIS — K219 Gastro-esophageal reflux disease without esophagitis: Secondary | ICD-10-CM | POA: Diagnosis not present

## 2019-08-22 DIAGNOSIS — G43009 Migraine without aura, not intractable, without status migrainosus: Secondary | ICD-10-CM | POA: Diagnosis not present

## 2019-08-22 DIAGNOSIS — Z6839 Body mass index (BMI) 39.0-39.9, adult: Secondary | ICD-10-CM | POA: Diagnosis present

## 2019-08-22 DIAGNOSIS — Z818 Family history of other mental and behavioral disorders: Secondary | ICD-10-CM | POA: Insufficient documentation

## 2019-08-22 DIAGNOSIS — C50412 Malignant neoplasm of upper-outer quadrant of left female breast: Secondary | ICD-10-CM | POA: Diagnosis present

## 2019-08-22 DIAGNOSIS — Z8261 Family history of arthritis: Secondary | ICD-10-CM | POA: Insufficient documentation

## 2019-08-22 DIAGNOSIS — I6523 Occlusion and stenosis of bilateral carotid arteries: Secondary | ICD-10-CM | POA: Insufficient documentation

## 2019-08-22 DIAGNOSIS — M069 Rheumatoid arthritis, unspecified: Secondary | ICD-10-CM | POA: Diagnosis not present

## 2019-08-22 DIAGNOSIS — M797 Fibromyalgia: Secondary | ICD-10-CM | POA: Insufficient documentation

## 2019-08-22 DIAGNOSIS — G43909 Migraine, unspecified, not intractable, without status migrainosus: Secondary | ICD-10-CM | POA: Diagnosis present

## 2019-08-22 DIAGNOSIS — I5032 Chronic diastolic (congestive) heart failure: Secondary | ICD-10-CM | POA: Diagnosis present

## 2019-08-22 DIAGNOSIS — E890 Postprocedural hypothyroidism: Secondary | ICD-10-CM | POA: Diagnosis present

## 2019-08-22 DIAGNOSIS — H34231 Retinal artery branch occlusion, right eye: Principal | ICD-10-CM | POA: Insufficient documentation

## 2019-08-22 DIAGNOSIS — Z8249 Family history of ischemic heart disease and other diseases of the circulatory system: Secondary | ICD-10-CM | POA: Insufficient documentation

## 2019-08-22 DIAGNOSIS — I10 Essential (primary) hypertension: Secondary | ICD-10-CM | POA: Diagnosis present

## 2019-08-22 DIAGNOSIS — Z880 Allergy status to penicillin: Secondary | ICD-10-CM | POA: Diagnosis not present

## 2019-08-22 DIAGNOSIS — Z6841 Body Mass Index (BMI) 40.0 and over, adult: Secondary | ICD-10-CM | POA: Diagnosis present

## 2019-08-22 DIAGNOSIS — J45909 Unspecified asthma, uncomplicated: Secondary | ICD-10-CM | POA: Diagnosis present

## 2019-08-22 DIAGNOSIS — Z886 Allergy status to analgesic agent status: Secondary | ICD-10-CM | POA: Insufficient documentation

## 2019-08-22 DIAGNOSIS — H5340 Unspecified visual field defects: Secondary | ICD-10-CM

## 2019-08-22 DIAGNOSIS — Z8349 Family history of other endocrine, nutritional and metabolic diseases: Secondary | ICD-10-CM | POA: Insufficient documentation

## 2019-08-22 LAB — CBC
HCT: 46.2 % — ABNORMAL HIGH (ref 36.0–46.0)
Hemoglobin: 14.6 g/dL (ref 12.0–15.0)
MCH: 24.9 pg — ABNORMAL LOW (ref 26.0–34.0)
MCHC: 31.6 g/dL (ref 30.0–36.0)
MCV: 78.7 fL — ABNORMAL LOW (ref 80.0–100.0)
Platelets: 282 10*3/uL (ref 150–400)
RBC: 5.87 MIL/uL — ABNORMAL HIGH (ref 3.87–5.11)
RDW: 16.5 % — ABNORMAL HIGH (ref 11.5–15.5)
WBC: 6.5 10*3/uL (ref 4.0–10.5)
nRBC: 0 % (ref 0.0–0.2)

## 2019-08-22 LAB — BASIC METABOLIC PANEL
Anion gap: 9 (ref 5–15)
BUN: 13 mg/dL (ref 8–23)
CO2: 27 mmol/L (ref 22–32)
Calcium: 9.3 mg/dL (ref 8.9–10.3)
Chloride: 102 mmol/L (ref 98–111)
Creatinine, Ser: 0.87 mg/dL (ref 0.44–1.00)
GFR calc Af Amer: >60 mL/min (ref >60)
GFR calc non Af Amer: >60 mL/min (ref >60)
Glucose, Bld: 111 mg/dL — ABNORMAL HIGH (ref 70–99)
Potassium: 3.9 mmol/L (ref 3.5–5.1)
Sodium: 138 mmol/L (ref 135–145)

## 2019-08-22 LAB — RESPIRATORY PANEL BY RT PCR (FLU A&B, COVID)
Influenza A by PCR: NEGATIVE
Influenza B by PCR: NEGATIVE
SARS Coronavirus 2 by RT PCR: NEGATIVE

## 2019-08-22 MED ORDER — METOCLOPRAMIDE HCL 5 MG/ML IJ SOLN
10.0000 mg | Freq: Once | INTRAMUSCULAR | Status: AC
  Administered 2019-08-22: 15:00:00 10 mg via INTRAVENOUS
  Filled 2019-08-22: qty 2

## 2019-08-22 MED ORDER — ACETAMINOPHEN 650 MG RE SUPP: 650.0000 mg | Freq: Four times a day (QID) | RECTAL | Status: DC | PRN

## 2019-08-22 MED ORDER — DIPHENHYDRAMINE HCL 50 MG/ML IJ SOLN
12.5000 mg | Freq: Once | INTRAMUSCULAR | Status: AC
  Administered 2019-08-22: 12.5 mg via INTRAVENOUS
  Filled 2019-08-22: qty 1

## 2019-08-22 MED ORDER — ACETAMINOPHEN 325 MG PO TABS
650.0000 mg | ORAL_TABLET | Freq: Four times a day (QID) | ORAL | Status: DC | PRN
  Administered 2019-08-22 – 2019-08-23 (×2): 650 mg via ORAL
  Filled 2019-08-22 (×2): qty 2

## 2019-08-22 NOTE — ED Notes (Signed)
Here for MRI--transfer from [facility] ED

## 2019-08-22 NOTE — Discharge Instructions (Addendum)
Please go straight to the [facility] Emergency Department. Do not drive, please have your husband drive you there.  This is to have an MRI performed.   Address:  [address] [place], [place] [address]

## 2019-08-22 NOTE — Consult Note (Signed)
Neurology Consultation Reason for Consult: Branch retinal artery occlusion Referring Physician: Tegeler, C  CC: Visual change in her right eye  History is obtained from: Patient  HPI: Michelle Kennedy is a 64 y.o. female with a history of hyperlipidemia, breast cancer in remission, who presents with visual change has been going on for at least a week.  She states that the temporal region of her right eye has decreased vision.  She saw an ophthalmologist who told her "you have had a stroke in your eye" and referred her to the emergency department.   LKW: A week ago tpa given?: no, outside of window    ROS: A 14 point ROS was performed and is negative except as noted in the HPI.   Past Medical History:  Diagnosis Date  . Abnormally small mouth   . Achilles tendon disorder, right   . Anemia   . Anxiety   . Arthritis    hips and knees  . Asthma    daily inhaler, prn inhaler and neb.  . Asthma due to environmental allergies   . Breast cancer ([place]) 01/2014   left  . Cancer ([place])   . Chest pain   . COPD (chronic obstructive pulmonary disease) ([place])   . Dental bridge present    upper front and lower right  . Dental crowns present    x 3  . Depression   . Esophageal spasm    reports since her chemo and breast surgery  she developed esophageal spasms and reports this is in the past has casue her 02 to desat in the  70s , denies sycnope in relation to this , does report hx of vertigo as well   . Family history of anesthesia complication    twin brother aspirated and died on OR table, per pt.  . Fibromyalgia   . Gallbladder disease   . GERD (gastroesophageal reflux disease)   . H/O blood clots    had blood to  PICC line   . History of gastric ulcer    as a teenager  . History of seizure age 65   as a reaction to Penicillin - no seizures since  . History of stomach ulcers   . History of thyroid cancer    s/p thyroidectomy  . Hyperlipidemia   . Hypothyroidism   . Joint pain    . Left knee injury   . Liver disorder    seen when she had her hysterectomy , reports she was told it was  " small lesion" ; but asymptomatic   . Lymphedema    left arm  . Migraines   . Multiple food allergies   . Obesity   . OSA (obstructive sleep apnea) 02/16/2016  . Palpitations    reports no longer experiences  . Personal history of chemotherapy 2015  . Personal history of radiation therapy 2015   Left  . Prediabetes   . Rheumatoid arthritis ([place])   . SOB (shortness of breath)   . Swelling of both ankles   . Tinnitus   . UTI (lower urinary tract infection) 02/17/2014  . Vertigo    last episode  was 2 weeks ago   . Vitamin D deficiency   . Wears contact lenses    left eye only   . Wears hearing aid in both ears      Family History  Problem Relation Age of Onset  . Alcohol abuse Mother   . Arthritis Mother   . Hypertension  Mother   . Bipolar disorder Mother   . Breast cancer Mother 67       unconfirmed  . Lung cancer Mother 61       smoker  . Hyperlipidemia Mother   . Stroke Mother   . Cancer Mother   . Depression Mother   . Anxiety disorder Mother   . Alcoholism Mother   . Drug abuse Mother   . Eating disorder Mother   . Obesity Mother   . Alcohol abuse Father   . Cancer Father        lung  . Hyperlipidemia Father   . Kidney disease Father   . Diabetes Father   . Hypertension Father   . Cystic kidney disease Father   . Thyroid disease Father   . Liver disease Father   . Alcoholism Father   . Arthritis Maternal Grandmother   . Diabetes Paternal Grandmother   . Thyroid cancer Sister 43       type?; currently 68  . Other Sister        ovarian tumor @ 67; TAH/BSO  . Breast cancer Sister   . Thyroid cancer Brother        dx 13s; currently 39  . Breast cancer Maternal Aunt        dx 53s; deceased 60  . Thyroid cancer Paternal Aunt        All 3 paternal aunts with thyroid ca in 30s/40s  . Lung cancer Paternal Aunt        2 of 3 paternal aunts with  lung cancer     Social History:  reports that she has never smoked. She has never used smokeless tobacco. She reports that she does not drink alcohol or use drugs.   Exam: Current vital signs: BP 140/80   Pulse 71   Temp 98 F (36.7 C) (Oral)   Resp 14   Ht 5\' 6"  (1.676 m)   Wt 130.2 kg   SpO2 100%   BMI 46.32 kg/m  Vital signs in last 24 hours: Temp:  [98 F (36.7 C)-98.6 F (37 C)] 98 F (36.7 C) (04/02 1719) Pulse Rate:  [68-84] 71 (04/02 1719) Resp:  [14-16] 14 (04/02 1719) BP: (138-177)/(80-93) 140/80 (04/02 1719) SpO2:  [97 %-100 %] 100 % (04/02 1719) Weight:  [130.2 kg-130.4 kg] 130.2 kg (04/02 1719)   Physical Exam  Constitutional: Appears well-developed and well-nourished.  Psych: Affect appropriate to situation Eyes: No scleral injection HENT: No OP obstrucion MSK: no joint deformities.  Cardiovascular: Normal rate and regular rhythm.  Respiratory: Effort normal, non-labored breathing GI: Soft.  No distension. There is no tenderness.  Skin: WDI  Neuro: Mental Status: Patient is awake, alert, oriented to person, place, month, year, and situation. Patient is able to give a clear and coherent history. No signs of aphasia or neglect Cranial Nerves: II: Decreased visual acuity in the upper quadrant on the right of the right eye pupils are equal, round, and reactive to light.   III,IV, VI: EOMI without ptosis or diploplia.  V: Facial sensation is symmetric to temperature VII: Facial movement is symmetric.  VIII: hearing is intact to voice X: Uvula elevates symmetrically XI: Shoulder shrug is symmetric. XII: tongue is midline without atrophy or fasciculations.  Motor: Tone is normal. Bulk is normal. 5/5 strength was present in all four extremities.  Sensory: Sensation is symmetric to light touch and temperature in the arms and legs.Deep Tendon Reflexes: 2+ and symmetric in the biceps and  patellae.  Plantars: Toes are downgoing bilaterally.   Cerebellar: FNF and HKS are intact bilaterally  I have reviewed labs in epic and the results pertinent to this consultation are: BMP-unremarkable  I have reviewed the images obtained: MRI brain-negative  Impression: 64 year old female with presumably branch retinal artery occlusion by patient report.  Unfortunately do not have access to the ophthalmology notes, but given that the symptoms are consistent with this, I do think proceeding with a stroke work-up would be reasonable at this time.    Recommendations: - HgbA1c, fasting lipid panel - Frequent neuro checks - Echocardiogram - Carotid dopplers - Prophylactic therapy-Antiplatelet med: Aspirin - dose 325mg  PO or 300mg  PR - Risk factor modification - Telemetry monitoring - PT consult, OT consult, Speech consult - Stroke team to follow  Roland Rack, MD [facility] [phone]  If 7pm- 7am, please page neurology on call as listed in [place].

## 2019-08-22 NOTE — ED Triage Notes (Signed)
BP has been higher than normal for 2 weeks. PMD increased her meds twice but it jumped back up again today.  They sent her to ED for eval.

## 2019-08-22 NOTE — ED Provider Notes (Signed)
[facility] EMERGENCY DEPARTMENT Provider Note   CSN: [id number] Arrival date & time: 08/22/19  1312     History Chief Complaint  Patient presents with  . Hypertension    Michelle Kennedy is a 64 y.o. female with a history of hypertension, hyperlipidemia, RA, COPD, OSA, fibromyalgia, & anxiety who presents to the ED with complaints of headache that began last night. Patient states that the headache is located to the right side of her head, aching/pressure in nature, began gradually and has been steady since onset. Current pain is a 6/10 in severity, no alleviating/aggravating factors. She has some "flickering lights" in the R eye visually that are mild. This feels fairly similar to some of her prior migraines. She states that she has had issues with her R eye lateral visual field looking "foggy and distorted" for about 2 weeks which prompted a referral to ophthalmology. She was seen by Dr. Manuella Ghazi yesterday who informed her that her R eye exam was abnormal and showed that she likely had a stroke at some time- she is unsure what term they used exactly, when I asked her about a retinal artery occlusion she thinks this may have been it. Dr Manuella Ghazi had instructed her to call her doctor if she has any headaches and given her headache this AM she called and was referred to the ED. She has also had some BP issues for the past couple of weeks, hadnt been taking her amlodipine for the past 9 months as her husband forgot to fill it, PCP did not restart this medicine instead started Lisinopril. Her BP was elevated this AM.  Patient denies double vision, numbness, weakness, dizziness like the room spinning, seizure activity, facial asymmetry, speech difficulty, emesis, fever, neck stiffness, chest pain, or dyspnea.  She denies history of prior stroke.  HPI    Past Medical History:  Diagnosis Date  . Abnormally small mouth   . Achilles tendon disorder, right   . Anemia   . Anxiety   . Arthritis    hips and knees  . Asthma    daily inhaler, prn inhaler and neb.  . Asthma due to environmental allergies   . Breast cancer ([place]) 01/2014   left  . Cancer ([place])   . Chest pain   . COPD (chronic obstructive pulmonary disease) ([place])   . Dental bridge present    upper front and lower right  . Dental crowns present    x 3  . Depression   . Esophageal spasm    reports since her chemo and breast surgery  she developed esophageal spasms and reports this is in the past has casue her 02 to desat in the  70s , denies sycnope in relation to this , does report hx of vertigo as well   . Family history of anesthesia complication    twin brother aspirated and died on OR table, per pt.  . Fibromyalgia   . Gallbladder disease   . GERD (gastroesophageal reflux disease)   . H/O blood clots    had blood to  PICC line   . History of gastric ulcer    as a teenager  . History of seizure age 18   as a reaction to Penicillin - no seizures since  . History of stomach ulcers   . History of thyroid cancer    s/p thyroidectomy  . Hyperlipidemia   . Hypothyroidism   . Joint pain   . Left knee injury   .  Liver disorder    seen when she had her hysterectomy , reports she was told it was  " small lesion" ; but asymptomatic   . Lymphedema    left arm  . Migraines   . Multiple food allergies   . Obesity   . OSA (obstructive sleep apnea) 02/16/2016  . Palpitations    reports no longer experiences  . Personal history of chemotherapy 2015  . Personal history of radiation therapy 2015   Left  . Prediabetes   . Rheumatoid arthritis ([place])   . SOB (shortness of breath)   . Swelling of both ankles   . Tinnitus   . UTI (lower urinary tract infection) 02/17/2014  . Vertigo    last episode  was 2 weeks ago   . Vitamin D deficiency   . Wears contact lenses    left eye only   . Wears hearing aid in both ears     Patient Active Problem List   Diagnosis Date Noted  . OA (osteoarthritis) of knee 12/03/2017    . Class 3 obesity without serious comorbidity with body mass index (BMI) of 40.0 to 44.9 in adult 01/01/2017  . Essential hypertension 10/18/2016  . Constipation 10/04/2016  . Vitamin D deficiency 10/04/2016  . Prediabetes 10/04/2016  . Morbid obesity ([place]) 09/20/2016  . Other fatigue 09/20/2016  . OSA (obstructive sleep apnea) 02/16/2016  . Right ankle pain 01/20/2016  . Strain of right Achilles tendon 11/30/2015  . Benign hypertension 10/11/2015  . Malignant neoplasm of thyroid gland ([place]) 10/11/2015  . De Quervain's tenosynovitis, bilateral 09/15/2015  . Right shoulder pain 09/15/2015  . Trigger finger, acquired 06/22/2015  . Left wrist pain 06/22/2015  . Thumb injury 06/14/2015  . 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 Postmenopausal osteoporosis 12/08/2014  . Preventative health care 12/01/2014  . DVT (deep venous thrombosis) ([place]) 08/28/2014  . Lymphedema of arm 08/28/2014  . Mucositis due to chemotherapy 06/02/2014  . UTI symptoms 02/17/2014  . Menopausal syndrome (hot flashes) 12/31/2013  . Breast cancer of upper-outer quadrant of left female breast ([place]) 12/26/2013  . 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 Gingivitis 12/05/2013  . Seasonal allergies 09/24/2013  . Skin lesion 06/03/2013  . Menopause 06/02/2013  . Elevated blood-pressure reading without diagnosis of hypertension 03/05/2013  . Cough 07/24/2012  . Left knee injury 11/02/2011  . Gout 09/19/2011  . Hyperlipidemia 06/02/2011  . Migraine 06/02/2011  . Tinnitus of both ears 02/14/2011  . Left knee pain 09/23/2010  . FIBROMYALGIA 05/10/2010  . Asthma 03/22/2010  . 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 OVERWEIGHT 12/20/2009  . GERD 12/20/2009  . Depression 12/01/2009    Past Surgical History:  Procedure Laterality Date  . ABDOMINAL HYSTERECTOMY  2004   complete  . ACHILLES TENDON REPAIR Right   . APPENDECTOMY  2004  . BREAST EXCISIONAL BIOPSY    .  BREAST LUMPECTOMY Left    2015  . BREAST LUMPECTOMY WITH NEEDLE LOCALIZATION AND AXILLARY SENTINEL LYMPH NODE BX Left 02/23/2014   Procedure: BREAST LUMPECTOMY WITH NEEDLE LOCALIZATION AND AXILLARY SENTINEL LYMPH NODE BIOPSY;  Surgeon: Excell Seltzer, MD;  Location: [facility];  Service: General;  Laterality: Left;  . BREAST SURGERY  2011   left breast biposy  . CHOLECYSTECTOMY  1990  . COLONOSCOPY W/ POLYPECTOMY  06/2009  . EYE SURGERY Right 2013   exc. warts from underneath eyelid  . INCONTINENCE SURGERY  2004  . KNEE ARTHROSCOPY Bilateral  x 6 each knee  . LIGAMENT REPAIR Right    thumb/wrist  . ORIF TOE FRACTURE Right    great toe  . PORTACATH PLACEMENT N/A 03/24/2014   Procedure: INSERTION PORT-A-CATH;  Surgeon: Excell Seltzer, MD;  Location: [facility];  Service: General;  Laterality: N/A;; removed   . THYROIDECTOMY  2000  . TONSILLECTOMY AND ADENOIDECTOMY  2000  . TOTAL KNEE ARTHROPLASTY Left 12/03/2017   Procedure: LEFT TOTAL KNEE ARTHROPLASTY;  Surgeon: Gaynelle Arabian, MD;  Location: [facility];  Service: Orthopedics;  Laterality: Left;  . TUMOR EXCISION     from thoracic spine     OB History    Gravida  3   Para  3   Term  3   Preterm      AB      Living  3     SAB      TAB      Ectopic      Multiple      Live Births              Family History  Problem Relation Age of Onset  . Alcohol abuse Mother   . Arthritis Mother   . Hypertension Mother   . Bipolar disorder Mother   . Breast cancer Mother 106       unconfirmed  . Lung cancer Mother 63       smoker  . Hyperlipidemia Mother   . Stroke Mother   . Cancer Mother   . Depression Mother   . Anxiety disorder Mother   . Alcoholism Mother   . Drug abuse Mother   . Eating disorder Mother   . Obesity Mother   . Alcohol abuse Father   . Cancer Father        lung  . Hyperlipidemia Father   . Kidney disease Father   . Diabetes Father   . Hypertension Father    . Cystic kidney disease Father   . Thyroid disease Father   . Liver disease Father   . Alcoholism Father   . Arthritis Maternal Grandmother   . Diabetes Paternal Grandmother   . Thyroid cancer Sister 18       type?; currently 16  . Other Sister        ovarian tumor @ 3; TAH/BSO  . Breast cancer Sister   . Thyroid cancer Brother        dx 1s; currently 8  . Breast cancer Maternal Aunt        dx 53s; deceased 35  . Thyroid cancer Paternal Aunt        All 3 paternal aunts with thyroid ca in 30s/40s  . Lung cancer Paternal Aunt        2 of 3 paternal aunts with lung cancer    Social History   Tobacco Use  . Smoking status: Never Smoker  . Smokeless tobacco: Never Used  Substance Use Topics  . Alcohol use: No    Alcohol/week: 0.0 standard drinks  . Drug use: No    Home Medications Prior to Admission medications   Medication Sig Start Date End Date Taking? Authorizing Provider  albuterol (PROAIR HFA) 108 (90 Base) MCG/ACT inhaler Inhale 2 puffs into the lungs every 6 (six) hours as needed for wheezing or shortness of breath. INHALE 2 PUFFS INTO THE LUNGS EVERY 6 HOURS AS NEEDED 05/13/19 08/11/19  Debbrah Alar, NP  albuterol (PROVENTIL) (5 MG/ML) 0.5% nebulizer solution Take 0.5 mLs (2.5 mg  total) by nebulization every 6 (six) hours as needed for wheezing or shortness of breath. 07/31/18   Debbrah Alar, NP  anastrozole (ARIMIDEX) 1 MG tablet Take 1 tablet (1 mg total) by mouth daily. 07/23/19   Nicholas Lose, MD  atorvastatin (LIPITOR) 20 MG tablet TAKE 1 TABLET BY MOUTH  DAILY AT 6 PM 05/05/19   Debbrah Alar, NP  DULoxetine (CYMBALTA) 60 MG capsule TAKE 1 CAPSULE BY MOUTH  DAILY Patient taking differently: Take 60 mg by mouth daily.  06/11/17   Debbrah Alar, NP  EPINEPHrine 0.3 mg/0.3 mL IJ SOAJ injection Inject 0.3 mLs (0.3 mg total) into the muscle once. 09/24/13   Debbrah Alar, NP  fluticasone (FLONASE) 50 MCG/ACT nasal spray Place 2 sprays into  both nostrils daily. 03/10/19   Saguier, Percell Miller, PA-C  Fluticasone-Salmeterol (WIXELA INHUB) 250-50 MCG/DOSE AEPB Inhale 1 puff into the lungs 2 (two) times daily. 07/05/18   Debbrah Alar, NP  furosemide (LASIX) 20 MG tablet Take 1 tablet (20 mg total) by mouth daily as needed for edema. 08/06/19   Debbrah Alar, NP  levothyroxine (SYNTHROID) 175 MCG tablet Take 1 tablet (175 mcg total) by mouth daily before breakfast. 07/18/19   Philemon Kingdom, MD  lisinopril (ZESTRIL) 10 MG tablet Take 3 tablets (30 mg total) by mouth daily. Take 1 tablet by mouth once daily 08/18/19   Debbrah Alar, NP  MYRBETRIQ 50 MG TB24 tablet TAKE 1 TABLET BY MOUTH  DAILY 06/16/19   Debbrah Alar, NP  ondansetron (ZOFRAN) 4 MG tablet Take 1 tablet (4 mg total) by mouth every 8 (eight) hours as needed for nausea or vomiting. 01/13/19   Debbrah Alar, NP  SUMAtriptan (IMITREX) 50 MG tablet Take 1 tablet by mouth at start of migraine, may repeat in 2 hours if not improved. Max 2 tabs/24 hrs 01/13/19   Debbrah Alar, NP  meloxicam (MOBIC) 7.5 MG tablet TAKE 1 TABLET BY MOUTH  DAILY 10/19/18   Debbrah Alar, NP    Allergies    Bee venom, Contrast media [iodinated diagnostic agents], Iodine, Latex, Penicillins, Shellfish allergy, Aspirin, Erythromycin, Lidocaine, Symbicort [budesonide-formoterol fumarate], Betadine [povidone iodine], Codeine, Compazine [prochlorperazine maleate], Pentazocine lactate, and Sulfonamide derivatives  Review of Systems   Review of Systems  Constitutional: Negative for chills and fever.  Eyes: Positive for visual disturbance.  Respiratory: Negative for shortness of breath.   Cardiovascular: Negative for chest pain.  Gastrointestinal: Negative for abdominal pain, diarrhea and vomiting.  Genitourinary: Negative for dysuria.  Neurological: Positive for headaches. Negative for dizziness, seizures, syncope, facial asymmetry, speech difficulty, weakness,  light-headedness and numbness.  All other systems reviewed and are negative.   Physical Exam Updated Vital Signs BP (!) 166/91 (BP Location: Right Arm)   Pulse 84   Temp 98.6 F (37 C) (Oral)   Resp 16   Ht 5' 6.5" (1.689 m)   Wt 130.4 kg   SpO2 99%   BMI 45.69 kg/m   Physical Exam Vitals and nursing note reviewed.  Constitutional:      General: She is not in acute distress.    Appearance: She is well-developed. She is not toxic-appearing.  HENT:     Head: Normocephalic and atraumatic.  Eyes:     General: Vision grossly intact. Gaze aligned appropriately. No visual field deficit.       Right eye: No discharge.        Left eye: No discharge.     Extraocular Movements: Extraocular movements intact.     Right eye:  No nystagmus.     Left eye: No nystagmus.     Conjunctiva/sclera: Conjunctivae normal.     Pupils: Pupils are equal, round, and reactive to light.     Comments: PERRL. No proptosis.   Cardiovascular:     Rate and Rhythm: Normal rate and regular rhythm.  Pulmonary:     Effort: Pulmonary effort is normal. No respiratory distress.     Breath sounds: Normal breath sounds. No wheezing, rhonchi or rales.  Abdominal:     General: There is no distension.     Palpations: Abdomen is soft.     Tenderness: There is no abdominal tenderness.  Musculoskeletal:     Cervical back: Normal range of motion and neck supple. No rigidity.  Skin:    General: Skin is warm and dry.     Findings: No rash.  Neurological:     Comments: Alert. Clear speech. No facial droop. CNIII-XII grossly intact. Bilateral upper and lower extremities' sensation grossly intact. 5/5 symmetric strength with grip strength and with plantar and dorsi flexion bilaterally . Normal finger to nose bilaterally. Negative pronator drift. Negative Romberg sign. Gait is steady and intact.   Psychiatric:        Behavior: Behavior normal.     ED Results / Procedures / Treatments   Labs (all labs ordered are  listed, but only abnormal results are displayed) Labs Reviewed - No data to display  EKG None  Radiology No results found.  Procedures Procedures (including critical care time)  Medications Ordered in ED Medications - No data to display  ED Course  I have reviewed the triage vital signs and the nursing notes.  Pertinent labs & imaging results that were available during my care of the patient were reviewed by me and considered in my medical decision making (see chart for details).    MDM Rules/Calculators/A&P                     Patient presents to the ED for evaluation of headache that began last night. She has also had R eye lateral visual field problems x 2 weeks- seen by ophthalmology and there was concern for stroke. Today she is nontoxic, resting comfortably, vitals WNL with the exception of somewhat elevated BP. She has no focal neurologic deficits, vision & visual fields appear grossly intact., but patient reports blurred/foggy vision to R eye lateral field. CT head without acute process or evidence of definitive prior stroke. Basic labs fairly unremarkable.   15:50: CONSULT: Discussed with neurologist Dr. Cheral Marker- recommends transfer to [facility] ED for MRI brain wo contrast, request that we attempt to have ophthalmology notes faxed to [facility] ED, and recommends 650 mg of chewable ASA in the ED. Re-consult neurology service with MRI results.   Discussed with patient she is allergic to aspirin, has a rash with wheezing therefore this was held. Will transfer to [facility] ED. She would prefer to go via private vehicle and her husband can drive her. We discussed need to go straight there.   16:16: CONSULT: Discussed with ED attending Dr. Sherry Ruffing- accepts patient in transfer.  Attempted to contact ophthalmology office for faxing of records- unfortunately office was closed early this afternoon for good Friday.    Findings and plan of care discussed with supervising physician Dr. Ronnald Nian  who is in agreement.    Final Clinical Impression(s) / ED Diagnoses Final diagnoses:  Acute nonintractable headache, unspecified headache type  Visual field defect    Rx /  DC Orders ED Discharge Orders    None       Amaryllis Dyke, PA-C 08/22/19 2128    Lennice Sites, DO 08/25/19 [phone]

## 2019-08-22 NOTE — ED Notes (Signed)
Report given to Janett Billow RN at [facility].  IV secured.  Patient left with spouse via POV heading to [place].

## 2019-08-22 NOTE — H&P (Signed)
History and Physical  Ganiyah Reif [id number] DOB: 27-Jan-1956 DOA: 08/22/2019  Referring physician: Marda Stalker MD PCP: Debbrah Alar, NP  Patient coming from: [facility]  Chief Complaint: Headache  HPI: Christien Schroth is a 64 y.o. female with medical history significant for hypertension, hyperlipidemia, COPD, fibromyalgia, anxiety, breast cancer and hypothyroidism who presents to the  emergency department with complaints of headache that has been ongoing since last night.  Headache is in the right supraorbital area and it was described as aching/pressure in nature.  She complains of right-sided lateral visual field disturbance described as " foggy and distorted" about 2 weeks, so she went to see an ophthalmologist yesterday.  Patient was seen by Dr. Manuella Ghazi who told her that her right eye exam was abnormal and showed that she likely had a stroke of some sort with unknown time.  Patient states that she was instructed to call her doctor if she has any headaches.  Since patient did present with headache last night, she called her physician this morning and she was referred to ED, so patient went to [facility] where she was noted to have elevated BP.  Patient states that she has not taken her BP meds (amlodipine) in 9 months, she was recently started on lisinopril which patient states was being titrated to control BP.  Unfortunately, BP was noted to be elevated this morning.  She denies weakness, dizziness, double vision numbness, fever, chills, chest pain, shortness of breath, speech difficulty, neck stiffness.  ED Course: In the emergency department, BP was 160/91 and other vital signs are within normal range.  Work-up in the ED showed normal CBC and BMP.  Respiratory panel for influenza, RSV and COVID-19 virus was negative.  CT head without contrast showed no acute intraocular pathology.  MRI head without contrast showed no evidence of recent infarction, hemorrhage or mass.  Neurologist (Dr.  Leonel Ramsay) was consulted by ED physician and there was suspicion for retinal emboli causing a stroke and an admission was recommended for echocardiogram and further stroke work-up.  Benadryl and Reglan were given in the ED due to headache (migraine).  Hospitalist was asked to admit patient for further evaluation management  Review of Systems: Constitutional: Negative for chills and fever.  HENT: Decreased right eye vision.  Negative for ear pain and sore throat.   Eyes: Negative for pain and visual disturbance.  Respiratory: Negative for cough, chest tightness and shortness of breath.   Cardiovascular: Negative for chest pain and palpitations.  Gastrointestinal: Negative for abdominal pain and vomiting.  Endocrine: Negative for polyphagia and polyuria.  Genitourinary: Negative for decreased urine volume, dysuria Musculoskeletal: Negative for arthralgias and back pain.  Skin: Negative for color change and rash.  Allergic/Immunologic: Negative for immunocompromised state.  Neurological: Negative for tremors, syncope, speech difficulty, weakness, light-headedness and headaches.  Hematological: Does not bruise/bleed easily.     Past Medical History:  Diagnosis Date  . Abnormally small mouth   . Achilles tendon disorder, right   . Anemia   . Anxiety   . Arthritis    hips and knees  . Asthma    daily inhaler, prn inhaler and neb.  . Asthma due to environmental allergies   . Breast cancer ([place]) 01/2014   left  . Cancer ([place])   . Chest pain   . COPD (chronic obstructive pulmonary disease) ([place])   . Dental bridge present    upper front and lower right  . Dental crowns present    x 3  .  Depression   . Esophageal spasm    reports since her chemo and breast surgery  she developed esophageal spasms and reports this is in the past has casue her 02 to desat in the  70s , denies sycnope in relation to this , does report hx of vertigo as well   . Family history of anesthesia complication     twin brother aspirated and died on OR table, per pt.  . Fibromyalgia   . Gallbladder disease   . GERD (gastroesophageal reflux disease)   . H/O blood clots    had blood to  PICC line   . History of gastric ulcer    as a teenager  . History of seizure age 63   as a reaction to Penicillin - no seizures since  . History of stomach ulcers   . History of thyroid cancer    s/p thyroidectomy  . Hyperlipidemia   . Hypothyroidism   . Joint pain   . Left knee injury   . Liver disorder    seen when she had her hysterectomy , reports she was told it was  " small lesion" ; but asymptomatic   . Lymphedema    left arm  . Migraines   . Multiple food allergies   . Obesity   . OSA (obstructive sleep apnea) 02/16/2016  . Palpitations    reports no longer experiences  . Personal history of chemotherapy 2015  . Personal history of radiation therapy 2015   Left  . Prediabetes   . Rheumatoid arthritis ([place])   . SOB (shortness of breath)   . Swelling of both ankles   . Tinnitus   . UTI (lower urinary tract infection) 02/17/2014  . Vertigo    last episode  was 2 weeks ago   . Vitamin D deficiency   . Wears contact lenses    left eye only   . Wears hearing aid in both ears    Past Surgical History:  Procedure Laterality Date  . ABDOMINAL HYSTERECTOMY  2004   complete  . ACHILLES TENDON REPAIR Right   . APPENDECTOMY  2004  . BREAST EXCISIONAL BIOPSY    . BREAST LUMPECTOMY Left    2015  . BREAST LUMPECTOMY WITH NEEDLE LOCALIZATION AND AXILLARY SENTINEL LYMPH NODE BX Left 02/23/2014   Procedure: BREAST LUMPECTOMY WITH NEEDLE LOCALIZATION AND AXILLARY SENTINEL LYMPH NODE BIOPSY;  Surgeon: Excell Seltzer, MD;  Location: [facility];  Service: General;  Laterality: Left;  . BREAST SURGERY  2011   left breast biposy  . CHOLECYSTECTOMY  1990  . COLONOSCOPY W/ POLYPECTOMY  06/2009  . EYE SURGERY Right 2013   exc. warts from underneath eyelid  . INCONTINENCE SURGERY  2004  .  KNEE ARTHROSCOPY Bilateral    x 6 each knee  . LIGAMENT REPAIR Right    thumb/wrist  . ORIF TOE FRACTURE Right    great toe  . PORTACATH PLACEMENT N/A 03/24/2014   Procedure: INSERTION PORT-A-CATH;  Surgeon: Excell Seltzer, MD;  Location: [facility];  Service: General;  Laterality: N/A;; removed   . THYROIDECTOMY  2000  . TONSILLECTOMY AND ADENOIDECTOMY  2000  . TOTAL KNEE ARTHROPLASTY Left 12/03/2017   Procedure: LEFT TOTAL KNEE ARTHROPLASTY;  Surgeon: Gaynelle Arabian, MD;  Location: [facility];  Service: Orthopedics;  Laterality: Left;  . TUMOR EXCISION     from thoracic spine    Social History:  reports that she has never smoked. She has never used  smokeless tobacco. She reports that she does not drink alcohol or use drugs.   Allergies  Allergen Reactions  . Bee Venom Anaphylaxis  . Contrast Media [Iodinated Diagnostic Agents] Shortness Of Breath  . Iodine Other (See Comments)    CARDIAC ARREST  . Latex Anaphylaxis and Rash  . Penicillins Shortness Of Breath, Rash and Other (See Comments)    SEIZURE Did it involve swelling of the face/tongue/throat, SOB, or low BP? no Did it involve sudden or severe rash/hives, skin peeling, or any reaction on the inside of your mouth or nose? yes Did you need to seek medical attention at a hospital or doctor's office? yes When did it last happen?2010 If all above answers are "NO", may proceed with cephalosporin use.   . Shellfish Allergy Shortness Of Breath and Rash  . Aspirin Rash and Other (See Comments)    GI UPSET  . Erythromycin Swelling and Rash    SWELLING OF JOINTS  . Lidocaine Swelling    SWELLING OF MOUTH AND THROAT  . Symbicort [Budesonide-Formoterol Fumarate] Other (See Comments)    BURNING OF TONGUE AND LIPS  . Betadine [Povidone Iodine]     Rash. Breathing problems.   . Codeine Rash  . Compazine [Prochlorperazine Maleate] Rash    Rash on face,chest, arms, back  . Pentazocine Lactate Rash  .  Sulfonamide Derivatives Rash    Family History  Problem Relation Age of Onset  . Alcohol abuse Mother   . Arthritis Mother   . Hypertension Mother   . Bipolar disorder Mother   . Breast cancer Mother 39       unconfirmed  . Lung cancer Mother 69       smoker  . Hyperlipidemia Mother   . Stroke Mother   . Cancer Mother   . Depression Mother   . Anxiety disorder Mother   . Alcoholism Mother   . Drug abuse Mother   . Eating disorder Mother   . Obesity Mother   . Alcohol abuse Father   . Cancer Father        lung  . Hyperlipidemia Father   . Kidney disease Father   . Diabetes Father   . Hypertension Father   . Cystic kidney disease Father   . Thyroid disease Father   . Liver disease Father   . Alcoholism Father   . Arthritis Maternal Grandmother   . Diabetes Paternal Grandmother   . Thyroid cancer Sister 45       type?; currently 39  . Other Sister        ovarian tumor @ 66; TAH/BSO  . Breast cancer Sister   . Thyroid cancer Brother        dx 15s; currently 37  . Breast cancer Maternal Aunt        dx 22s; deceased 14  . Thyroid cancer Paternal Aunt        All 3 paternal aunts with thyroid ca in 30s/40s  . Lung cancer Paternal Aunt        2 of 3 paternal aunts with lung cancer    Dr.Kirkpatrick  Prior to Admission medications   Medication Sig Start Date End Date Taking? Authorizing Provider  albuterol (PROVENTIL) (5 MG/ML) 0.5% nebulizer solution Take 0.5 mLs (2.5 mg total) by nebulization every 6 (six) hours as needed for wheezing or shortness of breath. 07/31/18  Yes Debbrah Alar, NP  albuterol (VENTOLIN HFA) 108 (90 Base) MCG/ACT inhaler Inhale 2 puffs into the lungs every 6 (  six) hours as needed for wheezing or shortness of breath.   Yes [provider]  anastrozole (ARIMIDEX) 1 MG tablet Take 1 tablet (1 mg total) by mouth daily. 07/23/19  Yes Nicholas Lose, MD  atorvastatin (LIPITOR) 20 MG tablet TAKE 1 TABLET BY MOUTH  DAILY AT 6 PM Patient  taking differently: Take 20 mg by mouth every evening.  05/05/19  Yes Debbrah Alar, NP  CALCIUM PO Take 1 tablet by mouth every evening.   Yes [provider]  cholecalciferol (VITAMIN D3) 25 MCG (1000 UNIT) tablet Take 1,000 Units by mouth daily.   Yes [provider]  EPINEPHrine 0.3 mg/0.3 mL IJ SOAJ injection Inject 0.3 mLs (0.3 mg total) into the muscle once. 09/24/13  Yes Debbrah Alar, NP  fluticasone (FLONASE) 50 MCG/ACT nasal spray Place 2 sprays into both nostrils daily. Patient taking differently: Place 2 sprays into both nostrils daily as needed for allergies.  03/10/19  Yes Saguier, Percell Miller, PA-C  Fluticasone-Salmeterol (WIXELA INHUB) 250-50 MCG/DOSE AEPB Inhale 1 puff into the lungs 2 (two) times daily. 07/05/18  Yes Debbrah Alar, NP  furosemide (LASIX) 20 MG tablet Take 1 tablet (20 mg total) by mouth daily as needed for edema. 08/06/19  Yes Debbrah Alar, NP  levothyroxine (SYNTHROID) 175 MCG tablet Take 1 tablet (175 mcg total) by mouth daily before breakfast. 07/18/19  Yes Philemon Kingdom, MD  lisinopril (ZESTRIL) 10 MG tablet Take 3 tablets (30 mg total) by mouth daily. Take 1 tablet by mouth once daily 08/18/19  Yes Debbrah Alar, NP  Multiple Vitamin (MULTIVITAMIN ADULT) TABS Take 1 tablet by mouth daily.   Yes [provider]  Multiple Vitamins-Minerals (OCUVITE PO) Take 1 tablet by mouth daily.   Yes [provider]  MYRBETRIQ 50 MG TB24 tablet TAKE 1 TABLET BY MOUTH  DAILY 06/16/19  Yes Debbrah Alar, NP  Omega-3 Fatty Acids (FISH OIL) 1000 MG CAPS Take 1 capsule by mouth daily.   Yes [provider]  SUMAtriptan (IMITREX) 50 MG tablet Take 1 tablet by mouth at start of migraine, may repeat in 2 hours if not improved. Max 2 tabs/24 hrs Patient taking differently: Take 50 mg by mouth every 2 (two) hours as needed for migraine. Take 1 tablet by mouth at start of migraine, may repeat in 2 hours if not  improved. Max 2 tabs/24 hrs 01/13/19  Yes Debbrah Alar, NP  albuterol (PROAIR HFA) 108 (90 Base) MCG/ACT inhaler Inhale 2 puffs into the lungs every 6 (six) hours as needed for wheezing or shortness of breath. INHALE 2 PUFFS INTO THE LUNGS EVERY 6 HOURS AS NEEDED 05/13/19 08/11/19  Debbrah Alar, NP  DULoxetine (CYMBALTA) 60 MG capsule TAKE 1 CAPSULE BY MOUTH  DAILY Patient not taking: No sig reported 06/11/17   Debbrah Alar, NP  ondansetron (ZOFRAN) 4 MG tablet Take 1 tablet (4 mg total) by mouth every 8 (eight) hours as needed for nausea or vomiting. Patient not taking: Reported on 08/22/2019 01/13/19   Debbrah Alar, NP  meloxicam (MOBIC) 7.5 MG tablet TAKE 1 TABLET BY MOUTH  DAILY 10/19/18   Debbrah Alar, NP    Physical Exam: BP (!) 142/86 (BP Location: Right Arm)   Pulse 72   Temp 97.8 F (36.6 C) (Oral)   Resp 18   Ht 5\' 6"  (1.676 m)   Wt 130 kg   SpO2 98%   BMI 46.26 kg/m   . General: 64 y.o. year-old female well developed well nourished in no acute  distress.  Alert and oriented x3. Marland Kitchen HEENT: Normocephalic, atraumatic. EOMI . Cardiovascular: Regular rate and rhythm with no rubs or gallops.  No thyromegaly or JVD noted.  No lower extremity edema. 2/4 pulses in all 4 extremities. Marland Kitchen Respiratory: Clear to auscultation with no wheezes or rales. Good inspiratory effort. . Abdomen: Soft nontender nondistended with normal bowel sounds x4 quadrants. . Muskuloskeletal: No cyanosis, clubbing or edema noted bilaterally . Neuro: CN II-XII intact, strength, sensation, reflexes . Skin: No ulcerative lesions noted or rashes . Psychiatry: Judgement and insight appear normal. Mood is appropriate for condition and setting          Labs on Admission:  Basic Metabolic Panel: Recent Labs  Lab 08/22/19 1444 08/23/19 0217  NA 138 142  K 3.9 4.2  CL 102 105  CO2 27 27  GLUCOSE 111* 93  BUN 13 10  CREATININE 0.87 0.81  CALCIUM 9.3 9.4   Liver Function  Tests: Recent Labs  Lab 08/23/19 0217  AST 22  ALT 41  ALKPHOS 62  BILITOT 0.9  PROT 5.9*  ALBUMIN 3.4*   No results for input(s): LIPASE, AMYLASE in the last 168 hours. No results for input(s): AMMONIA in the last 168 hours. CBC: Recent Labs  Lab 08/22/19 1444 08/23/19 0217  WBC 6.5 5.5  HGB 14.6 13.5  HCT 46.2* 43.4  MCV 78.7* 78.8*  PLT 282 275   Cardiac Enzymes: No results for input(s): CKTOTAL, CKMB, CKMBINDEX, TROPONINI in the last 168 hours.  BNP (last 3 results) No results for input(s): BNP in the last 8760 hours.  ProBNP (last 3 results) Recent Labs    07/30/19 1230  PROBNP 51.0    CBG: No results for input(s): GLUCAP in the last 168 hours.  Radiological Exams on Admission: CT Head Wo Contrast  Result Date: 08/22/2019 CLINICAL DATA:  Headache EXAM: CT HEAD WITHOUT CONTRAST TECHNIQUE: Contiguous axial images were obtained from the base of the skull through the vertex without intravenous contrast. COMPARISON:  None. FINDINGS: Brain: No evidence of acute infarction, hemorrhage, hydrocephalus, extra-axial collection or mass lesion/mass effect. Periventricular and deep white matter hypodensity. Vascular: No hyperdense vessel or unexpected calcification. Skull: Normal. Negative for fracture or focal lesion. Sinuses/Orbits: No acute finding. Other: None. IMPRESSION: 1. No acute intracranial pathology. No non-contrast CT findings to explain headache. 2.  Small-vessel white matter disease. Electronically Signed   By: Eddie Candle M.D.   On: 08/22/2019 15:17   MR ANGIO HEAD WO CONTRAST  Result Date: 08/23/2019 CLINICAL DATA:  Stroke follow-up. Vision changes. EXAM: MRA HEAD WITHOUT CONTRAST TECHNIQUE: Angiographic images of the Circle of Willis were obtained using MRA technique without intravenous contrast. COMPARISON:  None. FINDINGS: POSTERIOR CIRCULATION: --Vertebral arteries: Normal V4 segments. --Posterior inferior cerebellar arteries (PICA): Patent origins from the  vertebral arteries. --Anterior inferior cerebellar arteries (AICA): Patent origins from the basilar artery. --Basilar artery: Normal. --Superior cerebellar arteries: Normal. --Posterior cerebral arteries: Normal. The right PCA is partially supplied by a posterior communicating artery (p-comm). ANTERIOR CIRCULATION: --Intracranial internal carotid arteries: Normal. --Anterior cerebral arteries (ACA): Normal. Both A1 segments are present. Patent anterior communicating artery (a-comm). --Middle cerebral arteries (MCA): Normal. IMPRESSION: Normal brain MRA. No evidence of occlusion or high-grade stenosis. Electronically Signed   By: Ulyses Jarred M.D.   On: 08/23/2019 03:26   MR Brain Wo Contrast (neuro protocol)  Result Date: 08/22/2019 CLINICAL DATA:  Elevated blood pressure, neuro deficit EXAM: MRI HEAD WITHOUT CONTRAST TECHNIQUE: Multiplanar, multiecho pulse sequences of the brain  and surrounding structures were obtained without intravenous contrast. COMPARISON:  None. FINDINGS: Brain: There is no acute infarction or intracranial hemorrhage. There is no intracranial mass, mass effect, or edema. There is no hydrocephalus or extra-axial fluid collection. Ventricles and sulci are within normal limits in size and configuration. Minimal small foci of T2 hyperintensity in the supratentorial white matter are nonspecific but may reflect minor chronic microvascular ischemic changes. Vascular: Major vessel flow voids at the skull base are preserved. Skull and upper cervical spine: Normal marrow signal is preserved. Sinuses/Orbits: Trace ethmoid mucosal thickening. Orbits are unremarkable. Other: Sella is unremarkable.  Mastoid air cells are clear. IMPRESSION: No evidence of recent infarction, hemorrhage, or mass. Electronically Signed   By: Macy Mis M.D.   On: 08/22/2019 18:37    EKG: I independently viewed the EKG done and my findings are as followed: EKG was not done in the ED  Assessment/Plan Present on  Admission: . Postsurgical hypothyroidism . Asthma . Hyperlipidemia . Migraine . Breast cancer of upper-outer quadrant of left female breast ([place]) . Benign hypertension  Principal Problem:   Retinal artery occlusion Active Problems:   Postsurgical hypothyroidism   Asthma   Hyperlipidemia   Migraine   Breast cancer of upper-outer quadrant of left female breast ([facility])   Benign hypertension   Obesity, Class III, BMI 40-49.9 (morbid obesity) ([place])   Noncompliance with medications    Possible branch retinal artery occlusion R/O acute ischemic stroke Continue telemetry Bilateral carotid ultrasound in the morning Echocardiogram in the morning Continue fall precautions and neuro checks Lipid panel and hemoglobin A1c will be checked Continue PT/ST/OT eval and treat Bedside swallow eval by nursing prior to diet Neurology is following, we shall await further recommendations  Migraine headache Stable  Essential hypertension (controlled) Permissive hypertension will be allowed at this time  Hyperlipidemia Continue statin per home regimen  Left-sided breast cancer  Resolved Anastrozole per home regimen  Hypothyroidism Continue Synthroid per home regimen  Obesity Patient follow-up with outpatient for weight loss program  Medication noncompliance  Patient was counseled on importance of being compliant with treatment regimen and she verbalized understanding discussion.   DVT prophylaxis: SCDs  Code Status: Full  Family Communication: Husband at bedside, all questions answered to satisfaction  Disposition Plan: Observation with plan to discharge patient home within next 24 hours pending further stroke work-up as described above.  Consults called: Neurologist (Dr. Leonel Ramsay)  Admission status: Observation    Bernadette Hoit MD [facility] Pager [phone]  If 7PM-7AM, please contact night-coverage [email or web address] Password [email or web address]  08/23/2019, 7:05 AM

## 2019-08-22 NOTE — ED Provider Notes (Signed)
5:39 PM Patient transferred to [facility] for MRI to rule out intracranial abnormality leading to the visual field deficit seen by ophthalmology.  MRI was ordered by previous team before transfer.  Anticipate following up with his results incision made with neurology after per previous plan.  8:27 PM MRI just returned showing no acute stroke.  Neurology was called who is concerned that patient may have had a retinal emboli causing a stroke.  Due to this, neurology recommends admission for echo and further embolic stroke work-up.  Medicine team called for admission.   Marily Konczal, Gwenyth Allegra, MD 08/22/19 [phone]

## 2019-08-23 ENCOUNTER — Observation Stay (HOSPITAL_COMMUNITY): Payer: 59

## 2019-08-23 ENCOUNTER — Observation Stay (HOSPITAL_BASED_OUTPATIENT_CLINIC_OR_DEPARTMENT_OTHER): Payer: 59

## 2019-08-23 DIAGNOSIS — I10 Essential (primary) hypertension: Secondary | ICD-10-CM

## 2019-08-23 DIAGNOSIS — I6389 Other cerebral infarction: Secondary | ICD-10-CM | POA: Diagnosis not present

## 2019-08-23 DIAGNOSIS — I5032 Chronic diastolic (congestive) heart failure: Secondary | ICD-10-CM | POA: Diagnosis present

## 2019-08-23 DIAGNOSIS — H349 Unspecified retinal vascular occlusion: Secondary | ICD-10-CM | POA: Diagnosis not present

## 2019-08-23 DIAGNOSIS — Z170 Estrogen receptor positive status [ER+]: Secondary | ICD-10-CM | POA: Diagnosis not present

## 2019-08-23 DIAGNOSIS — C50412 Malignant neoplasm of upper-outer quadrant of left female breast: Secondary | ICD-10-CM | POA: Diagnosis not present

## 2019-08-23 DIAGNOSIS — E890 Postprocedural hypothyroidism: Secondary | ICD-10-CM

## 2019-08-23 DIAGNOSIS — Z9114 Patient's other noncompliance with medication regimen: Secondary | ICD-10-CM

## 2019-08-23 LAB — ECHOCARDIOGRAM COMPLETE
Height: 66 in
Weight: 4585.57 oz

## 2019-08-23 LAB — CBC
HCT: 43.4 % (ref 36.0–46.0)
Hemoglobin: 13.5 g/dL (ref 12.0–15.0)
MCH: 24.5 pg — ABNORMAL LOW (ref 26.0–34.0)
MCHC: 31.1 g/dL (ref 30.0–36.0)
MCV: 78.8 fL — ABNORMAL LOW (ref 80.0–100.0)
Platelets: 275 10*3/uL (ref 150–400)
RBC: 5.51 MIL/uL — ABNORMAL HIGH (ref 3.87–5.11)
RDW: 16 % — ABNORMAL HIGH (ref 11.5–15.5)
WBC: 5.5 10*3/uL (ref 4.0–10.5)
nRBC: 0 % (ref 0.0–0.2)

## 2019-08-23 LAB — COMPREHENSIVE METABOLIC PANEL
ALT: 41 U/L (ref 0–44)
AST: 22 U/L (ref 15–41)
Albumin: 3.4 g/dL — ABNORMAL LOW (ref 3.5–5.0)
Alkaline Phosphatase: 62 U/L (ref 38–126)
Anion gap: 10 (ref 5–15)
BUN: 10 mg/dL (ref 8–23)
CO2: 27 mmol/L (ref 22–32)
Calcium: 9.4 mg/dL (ref 8.9–10.3)
Chloride: 105 mmol/L (ref 98–111)
Creatinine, Ser: 0.81 mg/dL (ref 0.44–1.00)
GFR calc Af Amer: >60 mL/min (ref >60)
GFR calc non Af Amer: >60 mL/min (ref >60)
Glucose, Bld: 93 mg/dL (ref 70–99)
Potassium: 4.2 mmol/L (ref 3.5–5.1)
Sodium: 142 mmol/L (ref 135–145)
Total Bilirubin: 0.9 mg/dL (ref 0.3–1.2)
Total Protein: 5.9 g/dL — ABNORMAL LOW (ref 6.5–8.1)

## 2019-08-23 LAB — HEMOGLOBIN A1C
Hgb A1c MFr Bld: 6.1 % — ABNORMAL HIGH (ref 4.8–5.6)
Mean Plasma Glucose: 128.37 mg/dL

## 2019-08-23 LAB — APTT: aPTT: 29 seconds (ref 24–36)

## 2019-08-23 LAB — LIPID PANEL
Cholesterol: 174 mg/dL (ref 0–200)
HDL: 48 mg/dL (ref >40)
LDL Cholesterol: 102 mg/dL — ABNORMAL HIGH (ref 0–99)
Total CHOL/HDL Ratio: 3.6 RATIO
Triglycerides: 121 mg/dL (ref <150)
VLDL: 24 mg/dL (ref 0–40)

## 2019-08-23 LAB — PROTIME-INR
INR: 1 (ref 0.8–1.2)
Prothrombin Time: 12.7 seconds (ref 11.4–15.2)

## 2019-08-23 LAB — HIV ANTIBODY (ROUTINE TESTING W REFLEX): HIV Screen 4th Generation wRfx: NONREACTIVE

## 2019-08-23 MED ORDER — PERFLUTREN LIPID MICROSPHERE
1.0000 mL | INTRAVENOUS | Status: AC | PRN
  Administered 2019-08-23: 3 mL via INTRAVENOUS
  Filled 2019-08-23: qty 10

## 2019-08-23 MED ORDER — LEVOTHYROXINE SODIUM 75 MCG PO TABS
175.0000 ug | ORAL_TABLET | Freq: Every day | ORAL | Status: DC
  Administered 2019-08-23: 175 ug via ORAL
  Filled 2019-08-23: qty 1

## 2019-08-23 MED ORDER — ATORVASTATIN CALCIUM 40 MG PO TABS: 40.0000 mg | ORAL_TABLET | Freq: Every day | ORAL | 1 refills | Status: DC

## 2019-08-23 MED ORDER — FLUTICASONE PROPIONATE 50 MCG/ACT NA SUSP: 2.0000 | Freq: Every day | NASAL | Status: DC | PRN

## 2019-08-23 MED ORDER — LISINOPRIL 20 MG PO TABS
30.0000 mg | ORAL_TABLET | Freq: Every day | ORAL | Status: DC
  Administered 2019-08-23: 30 mg via ORAL
  Filled 2019-08-23: qty 1

## 2019-08-23 MED ORDER — ATORVASTATIN CALCIUM 40 MG PO TABS: 40.0000 mg | ORAL_TABLET | Freq: Every evening | ORAL | Status: DC

## 2019-08-23 MED ORDER — ANASTROZOLE 1 MG PO TABS
1.0000 mg | ORAL_TABLET | Freq: Every day | ORAL | Status: DC
  Administered 2019-08-23: 1 mg via ORAL
  Filled 2019-08-23: qty 1

## 2019-08-23 MED ORDER — CLOPIDOGREL BISULFATE 75 MG PO TABS: 75.0000 mg | ORAL_TABLET | Freq: Every day | ORAL | 1 refills | Status: DC

## 2019-08-23 MED ORDER — VITAMIN D 25 MCG (1000 UNIT) PO TABS
1000.0000 [IU] | ORAL_TABLET | Freq: Every day | ORAL | Status: DC
  Administered 2019-08-23: 1000 [IU] via ORAL
  Filled 2019-08-23: qty 1

## 2019-08-23 MED ORDER — ATORVASTATIN CALCIUM 10 MG PO TABS: 20.0000 mg | ORAL_TABLET | Freq: Every evening | ORAL | Status: DC

## 2019-08-23 MED ORDER — CLOPIDOGREL BISULFATE 75 MG PO TABS
75.0000 mg | ORAL_TABLET | Freq: Every day | ORAL | Status: DC
  Administered 2019-08-23: 75 mg via ORAL
  Filled 2019-08-23: qty 1

## 2019-08-23 MED ORDER — ATORVASTATIN CALCIUM 10 MG PO TABS
20.0000 mg | ORAL_TABLET | Freq: Every evening | ORAL | Status: DC
  Administered 2019-08-23: 20 mg via ORAL
  Filled 2019-08-23: qty 2

## 2019-08-23 NOTE — Evaluation (Signed)
SLP Cancellation Note  Patient Details Name: Michelle Kennedy MRN: [id number] DOB: 1955/05/31   Cancelled treatment:       Reason Eval/Treat Not Completed: Other (comment);SLP screened, no needs identified, will sign off(pt passed [facility] swallow screen)   Macario Golds 08/23/2019, 11:10 AM

## 2019-08-23 NOTE — Progress Notes (Signed)
PT Cancellation Note  Patient Details Name: Michelle Kennedy MRN: [id number] DOB: [birth year]   Cancelled Treatment:    Reason Eval/Treat Not Completed: Other (comment).  Pt with nursing agreement reports she is safe and independent with mobility.  Declines PT evaluation.  Asked nursing and pt to let PT know if anything changes and follow-up needed.   Ramond Dial 08/23/2019, 9:48 AM   Mee Hives, PT MS Acute Rehab Dept. Number: [place] and [place]

## 2019-08-23 NOTE — Progress Notes (Signed)
STROKE TEAM PROGRESS NOTE   INTERVAL HISTORY Her RN and husband are at the bedside.  Pt sitting at the edge of bed, no complains except right eye lateral temporal part of visual field seeing shadows. Otherwise, no complains. Neuro exam intact except above mentioned in the right eye. MRI, MRA, CUS neg.   OBJECTIVE Vitals:   08/22/19 1529 08/22/19 1719 08/23/19 0039 08/23/19 0810  BP: 138/80 140/80 (!) 142/86 (!) 133/92  Pulse: 68 71 72 72  Resp: 14 14 18  (!) 21  Temp:  98 F (36.7 C) 97.8 F (36.6 C) 97.7 F (36.5 C)  TempSrc:  Oral Oral   SpO2: 97% 100% 98% 97%  Weight:  130.2 kg 130 kg   Height:  5\' 6"  (1.676 m) 5\' 6"  (1.676 m)     CBC:  Recent Labs  Lab 08/22/19 1444 08/23/19 0217  WBC 6.5 5.5  HGB 14.6 13.5  HCT 46.2* 43.4  MCV 78.7* 78.8*  PLT 282 [id number]    Basic Metabolic Panel:  Recent Labs  Lab 08/22/19 1444 08/23/19 0217  NA 138 142  K 3.9 4.2  CL 102 105  CO2 27 27  GLUCOSE 111* 93  BUN 13 10  CREATININE 0.87 0.81  CALCIUM 9.3 9.4    Lipid Panel:     Component Value Date/Time   CHOL 174 08/23/2019 0217   CHOL 177 09/20/2016 1045   TRIG 121 08/23/2019 0217   HDL 48 08/23/2019 0217   HDL 70 09/20/2016 1045   CHOLHDL 3.6 08/23/2019 0217   VLDL 24 08/23/2019 0217   LDLCALC 102 (H) 08/23/2019 0217   LDLCALC 78 09/20/2016 1045   HgbA1c:  Lab Results  Component Value Date   HGBA1C 6.1 (H) 08/23/2019   Urine Drug Screen: No results found for: LABOPIA, COCAINSCRNUR, LABBENZ, AMPHETMU, THCU, LABBARB  Alcohol Level No results found for: ETH  IMAGING  CT Head Wo Contrast 08/22/2019 IMPRESSION:  1. No acute intracranial pathology. No non-contrast CT findings to explain headache.  2.  Small-vessel white matter disease.   MR ANGIO HEAD WO CONTRAST 08/23/2019 IMPRESSION:  Normal brain MRA. No evidence of occlusion or high-grade stenosis.   MR Brain Wo Contrast (neuro protocol) 08/22/2019 IMPRESSION:  No evidence of recent infarction, hemorrhage, or  mass.   MRA Head unremarkable   Transthoracic Echocardiogram  00/00/2021 Pending   Bilateral Carotid Dopplers  Unremarkable    PHYSICAL EXAM  Temp:  [97.7 F (36.5 C)-98.6 F (37 C)] 97.7 F (36.5 C) (04/03 0810) Pulse Rate:  [68-84] 72 (04/03 0810) Resp:  [14-21] 21 (04/03 0810) BP: (133-177)/(80-93) 133/92 (04/03 0810) SpO2:  [97 %-100 %] 97 % (04/03 0810) Weight:  [130 kg-130.4 kg] 130 kg (04/03 0039)  General - Well nourished, well developed, in no apparent distress.  Ophthalmologic - fundi not visualized due to noncooperation.  Cardiovascular - Regular rhythm and rate.  Mental Status -  Level of arousal and orientation to time, place, and person were intact. Language including expression, naming, repetition, comprehension was assessed and found intact. Fund of Knowledge was assessed and was intact.  Cranial Nerves II - XII - II - Visual field intact OU. Right eye lateral temporal visual field seeing shadows. III, IV, VI - Extraocular movements intact. V - Facial sensation intact bilaterally. VII - Facial movement intact bilaterally. VIII - Hearing & vestibular intact bilaterally. X - Palate elevates symmetrically. XI - Chin turning & shoulder shrug intact bilaterally. XII - Tongue protrusion intact.  Motor Strength -  The patient's strength was normal in all extremities and pronator drift was absent.  Bulk was normal and fasciculations were absent.   Motor Tone - Muscle tone was assessed at the neck and appendages and was normal.  Reflexes - The patient's reflexes were symmetrical in all extremities and she had no pathological reflexes.  Sensory - Light touch, temperature/pinprick were assessed and were symmetrical.    Coordination - The patient had normal movements in the hands and feet with no ataxia or dysmetria.  Tremor was absent.  Gait and Station - normal gait.    ASSESSMENT/PLAN [name] is a 64 y.o. female with history of  hyperlipidemia, RA, hearing impaired, reported aspirin allergy (rash), palpitations, prediabetes, migraines, OSA, asthma / COPD,  hx of "blood clots" and breast cancer in remission, who presented with visual change in the temporal region of her right eyegoing on for at least a week. She did not receive IV t-PA due to late presentation (>4.5 hours from time of onset)  Right BRAO - isolated  CT head - No acute intracranial pathology.   MRI head 08/22/19 - No evidence of recent infarction, hemorrhage, or mass.   MRA head 08/23/19 - Normal brain MRA. No evidence of occlusion or high-grade stenosis.   CTA H&N - not able to do due to iodine allergy  Carotid Doppler unremarkable  2D Echo - pending  Michelle Kennedy Virus 2 - negative  LDL - 102  HgbA1c - 6.1  UDS - pending  VTE prophylaxis - SCDs  No antithrombotic prior to admission, now on clopidogrel 75 mg daily (ASA allergy). Continue plavix on discharge.  Patient counseled to be compliant with her antithrombotic medications  Ongoing aggressive stroke risk factor management  Therapy recommendations:  pending  Disposition:  Home - will need close ophthalmology follow up  Hypertension  Home BP meds: Zestril 30 mg daily  Current BP meds: Zestril 30 mg daily  Stable . Long-term BP goal normotensive  Hyperlipidemia  Home Lipid lowering medication: Lipitor 20 mg daily  LDL 102, goal < 70  Current lipid lowering medication: Lipitor 40 mg daily -  Continue statin at discharge  Other Stroke Risk Factors  Advanced age  Obesity, Body mass index is 46.26 kg/m., recommend weight loss, diet and exercise as appropriate   Family hx stroke (mother)  Migraines  Obstructive sleep apnea.   Hx of "blood clots"   Other Active Problems    Hospital day # 0  Neurology will sign off. Please call with questions. No neuro follow up needed at this time. Thanks for the consult.  Rosalin Hawking, MD PhD Stroke  Neurology 08/23/2019 11:13 AM    To contact Stroke Continuity provider, please refer to [email or web address] After hours, contact General Neurology

## 2019-08-23 NOTE — Plan of Care (Signed)
  Problem: Clinical Measurements: Goal: Ability to maintain clinical measurements within normal limits will improve Outcome: Progressing   

## 2019-08-23 NOTE — Evaluation (Signed)
Occupational Therapy Evaluation Patient Details Name: Michelle Kennedy MRN: [id number] DOB: 04/07/1956 Today's Date: 08/23/2019    History of Present Illness Michelle Kennedy is a 64 y.o. female with medical history significant for hypertension, hyperlipidemia, COPD, fibromyalgia, anxiety, breast cancer and hypothyroidism who presents to the  emergency department with complaints of right supraorbital area headache. Work up for suspected retinal artery occlusion. Imaging negative for acute stroke.   Clinical Impression   PTA pt living independently with spouse, is retired. At time of eval pt is independent for all transfers and mobility, as well as BADL tasks. Pt still presenting with R peripheral vision blurring. Reviewed visual compensatory strategies for BADL and IADL. Educated pt to turn head, have someone walk on R side in public, refrain from driving until cleared by MD, and remove extra clutter in home to prevent falls. Also educated pt on engaging in visually stimulating activities such as word games/puzzles to challenge R periphery. Husband present for session and education as well. All needs met and pt/husband indicate understanding. No further OT needs identified, OT will sign off.    Follow Up Recommendations  No OT follow up    Equipment Recommendations  None recommended by OT    Recommendations for Other Services       Precautions / Restrictions Precautions Precautions: None Restrictions Weight Bearing Restrictions: No      Mobility Bed Mobility Overal bed mobility: Independent                Transfers Overall transfer level: Independent                    Balance Overall balance assessment: Independent                                         ADL either performed or assessed with clinical judgement   ADL Overall ADL's : Independent                                       General ADL Comments: Pt demonstrates  ability to complete independent BADL. She is completing all transfers, mobility, and ADL tasks in room without assistance from staff. Session focused on visual deficit and its impacts on ADL/IADL safety when returning home and into community     Vision Baseline Vision/History: Wears glasses Wears Glasses: Distance only Patient Visual Report: Blurring of vision;Other (comment)(blurring in R lateral field) Vision Assessment?: Yes Eye Alignment: Within Functional Limits Ocular Range of Motion: Within Functional Limits Alignment/Gaze Preference: Within Defined Limits Tracking/Visual Pursuits: Able to track stimulus in all quads without difficulty Saccades: Within functional limits Convergence: Within functional limits Visual Fields: Right visual field deficit;Other (comment)(R lateral blurring in periphery) Additional Comments: Pt reports R peripheral blurring when looking straight ahead. She is not able to make out figures or identify objects in this area, describes it as "in a thick fog". States she has corrective lenses for distance but these seem to aggravate visual issues more. Has plans to see eye doctor and neuro at end of month for additional follow up.     Perception     Praxis      Pertinent Vitals/Pain       Hand Dominance     Extremity/Trunk Assessment Upper Extremity Assessment Upper Extremity Assessment: Overall  WFL for tasks assessed   Lower Extremity Assessment Lower Extremity Assessment: Overall WFL for tasks assessed       Communication Communication Communication: No difficulties   Cognition Arousal/Alertness: Awake/alert Behavior During Therapy: WFL for tasks assessed/performed Overall Cognitive Status: Within Functional Limits for tasks assessed                                     General Comments       Exercises     Shoulder Instructions      Home Living Family/patient expects to be discharged to:: Private residence Living  Arrangements: Spouse/significant other Available Help at Discharge: Family Type of Home: House Home Access: Stairs to enter Technical brewer of Steps: 1   Home Layout: One level               Home Equipment: None          Prior Functioning/Environment Level of Independence: Independent        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; 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 Problem List: Impaired vision/perception;Decreased knowledge of precautions      OT Treatment/Interventions:      OT Goals(Current goals can be found in the care plan section) Acute Rehab OT Goals Patient Stated Goal: return to knitting and sewing OT Goal Formulation: With patient Time For Goal Achievement: 09/06/19 Potential to Achieve Goals: Good  OT Frequency:     Barriers to D/C:            Co-evaluation              AM-PAC OT "6 Clicks" Daily Activity     Outcome Measure Help from another person eating meals?: None Help from another person taking care of personal grooming?: None Help from another person toileting, which includes using toliet, bedpan, or urinal?: None Help from another person bathing (including washing, rinsing, drying)?: None Help from another person to put on and taking off regular upper body clothing?: None Help from another person to put on and taking off regular lower body clothing?: None 6 Click Score: 24   End of Session Nurse Communication: Mobility status  Activity Tolerance: Patient tolerated treatment well Patient left: in bed;with family/visitor present  OT Visit Diagnosis: Low vision, both eyes (H54.2)                Time: 1110-1120 OT Time Calculation (min): 10 min Charges:  OT General Charges $OT Visit: 1 Visit OT Evaluation $OT Eval Low Complexity: 1 Low  Zenovia Jarred, MSOT, OTR/L Acute Rehabilitation Services [facility] Office Number: [phone] Pager: [phone]  Zenovia Jarred 08/23/2019, 2:07 PM

## 2019-08-23 NOTE — Progress Notes (Signed)
  Echocardiogram 2D Echocardiogram has been performed with Definity.  Michelle Kennedy 08/23/2019, 10:47 AM

## 2019-08-23 NOTE — Progress Notes (Signed)
Carotid duplex has been completed.   Preliminary results in CV Proc.   [name] 08/23/2019 10:08 AM

## 2019-08-23 NOTE — Discharge Summary (Signed)
Discharge Summary  Michelle Kennedy [id number] DOB: April 25, 1956  PCP: Debbrah Alar, NP  Admit date: 08/22/2019 Discharge date: 08/23/2019  Time spent: 25 minutes  Recommendations for Outpatient Follow-up:  1. New medication: Plavix 75 mg p.o. daily 2. Medication change: Lipitor increased from 20 mg to 40 mg daily  Discharge Diagnoses:  Active Hospital Problems   Diagnosis Date Noted  . Retinal artery occlusion 08/22/2019  . Noncompliance with medications 08/23/2019  . Chronic diastolic CHF (congestive heart failure) ([place]) 08/23/2019  . Obesity, Class III, BMI 40-49.9 (morbid obesity) ([place]) 09/20/2016  . Benign hypertension 10/11/2015  . Breast cancer of upper-outer quadrant of left female breast ([place]) 12/26/2013  . Hyperlipidemia 06/02/2011  . Migraine 06/02/2011  . Asthma 03/22/2010  . 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    Resolved Hospital Problems  No resolved problems to display.    Discharge Condition: Improved, being discharged home  Diet recommendation: Heart healthy  Vitals:   08/23/19 0039 08/23/19 0810  BP: (!) 142/86 (!) 133/92  Pulse: 72 72  Resp: 18 (!) 21  Temp: 97.8 F (36.6 C) 97.7 F (36.5 C)  SpO2: 98% 97%    History of present illness:  64 year old female past medical history of hypertension, hyperlipidemia, breast cancer, hypothyroidism and COPD presented to emergency room on the evening of 4/2 with complaints of a headache that had been ongoing since the day prior.  She also has been complaining of right sided lateral visual field disturbance for about 2 weeks.  She had seen her ophthalmologist on 4/1 and was told that she possibly had had a stroke.  Patient plan to follow-up with her doctor, but then went to the emergency room when she started having a headache.  Patient states that she had stopped taking her Norvasc blood pressure medication about 9 months prior.  She was recently started on lisinopril in attempt to control her blood  pressure.  In the emergency room, noted to have elevated blood pressure with a systolic of 0000000.  MRI and CT noted no evidence of recent infarction, hemorrhage or mass.  Neurology was consulted and there were concerns for possible retinal emboli.  Hospitalist were called for further evaluation.  Hospital Course:  Principal Problem:   Retinal artery occlusion: Patient underwent full stroke work-up.  Dopplers unremarkable.  Echocardiogram noted no evidence of thrombus.  Followed by neurology who recommended starting her on Plavix 75 p.o. daily. Active Problems:   Postsurgical hypothyroidism: Continue Synthroid.    Asthma: Stable during his hospitalization.    Hyperlipidemia: During stroke work-up, lipid panel noted LDL of 102.  Patient already on statin.  On discharge, Lipitor increased from 20-40.   Migraine   Breast cancer of upper-outer quadrant of left female breast ([place]) Malignant hypertension: Advised to take her medications in the future.  Has recently been started on lisinopril.  Urged her to continue this.    Obesity, Class III, BMI 40-49.9 (morbid obesity) ([place]): Meets criteria BMI greater than 40    Noncompliance with medications: Encouraged to take her medications.    Chronic diastolic CHF (congestive heart failure) ([place]): Incidentally noted on echocardiogram.  Grade 1 diastolic dysfunction.   Procedures:  Echocardiogram: Preserved ejection fraction, grade 1 diastolic dysfunction  Carotid Dopplers: No significant stenoses bilaterally  Consultations:  Neurology  Discharge Exam: BP (!) 133/92 (BP Location: Right Arm)   Pulse 72   Temp 97.7 F (36.5 C)   Resp (!) 21   Ht 5\' 6"  (1.676 m)   Wt 130 kg  SpO2 97%   BMI 46.26 kg/m   General: Alert and oriented x3, no acute distress Cardiovascular: Regular rate and rhythm, S1-S2 Respiratory: Clear to auscultation bilaterally  Discharge Instructions You were cared for by a hospitalist during your hospital stay. If you  have any questions about your discharge medications or the care you received while you were in the hospital after you are discharged, you can call the unit and asked to speak with the hospitalist on call if the hospitalist that took care of you is not available. Once you are discharged, your primary care physician will handle any further medical issues. Please note that NO REFILLS for any discharge medications will be authorized once you are discharged, as it is imperative that you return to your primary care physician (or establish a relationship with a primary care physician if you do not have one) for your aftercare needs so that they can reassess your need for medications and monitor your lab values.  Discharge Instructions    Diet - low sodium heart healthy   Complete by: As directed    Increase activity slowly   Complete by: As directed      Allergies as of 08/23/2019      Reactions   Bee Venom Anaphylaxis   Contrast Media [iodinated Diagnostic Agents] Shortness Of Breath   Iodine Other (See Comments)   CARDIAC ARREST   Latex Anaphylaxis, Rash   Penicillins Shortness Of Breath, Rash, Other (See Comments)   SEIZURE Did it involve swelling of the face/tongue/throat, SOB, or low BP? no Did it involve sudden or severe rash/hives, skin peeling, or any reaction on the inside of your mouth or nose? yes Did you need to seek medical attention at a hospital or doctor's office? yes When did it last happen?2010 If all above answers are "NO", may proceed with cephalosporin use.   Shellfish Allergy Shortness Of Breath, Rash   Aspirin Rash, Other (See Comments)   GI UPSET   Erythromycin Swelling, Rash   SWELLING OF JOINTS   Lidocaine Swelling   SWELLING OF MOUTH AND THROAT   Symbicort [budesonide-formoterol Fumarate] Other (See Comments)   BURNING OF TONGUE AND LIPS   Betadine [povidone Iodine]    Rash. Breathing problems.   Codeine Rash   Compazine [prochlorperazine Maleate] Rash    Rash on face,chest, arms, back   Pentazocine Lactate Rash   Sulfonamide Derivatives Rash      Medication List    TAKE these medications   albuterol 108 (90 Base) MCG/ACT inhaler Commonly known as: VENTOLIN HFA Inhale 2 puffs into the lungs every 6 (six) hours as needed for wheezing or shortness of breath.   albuterol (5 MG/ML) 0.5% nebulizer solution Commonly known as: PROVENTIL Take 0.5 mLs (2.5 mg total) by nebulization every 6 (six) hours as needed for wheezing or shortness of breath.   albuterol 108 (90 Base) MCG/ACT inhaler Commonly known as: ProAir HFA Inhale 2 puffs into the lungs every 6 (six) hours as needed for wheezing or shortness of breath. INHALE 2 PUFFS INTO THE LUNGS EVERY 6 HOURS AS NEEDED   anastrozole 1 MG tablet Commonly known as: ARIMIDEX Take 1 tablet (1 mg total) by mouth daily.   atorvastatin 40 MG tablet Commonly known as: LIPITOR Take 1 tablet (40 mg total) by mouth daily at 6 PM. What changed:   medication strength  See the new instructions.   CALCIUM PO Take 1 tablet by mouth every evening.   cholecalciferol 25 MCG (1000 UNIT)  tablet Commonly known as: VITAMIN D3 Take 1,000 Units by mouth daily.   clopidogrel 75 MG tablet Commonly known as: PLAVIX Take 1 tablet (75 mg total) by mouth daily. Start taking on: August 24, 2019   EPINEPHrine 0.3 mg/0.3 mL Soaj injection Commonly known as: EPI-PEN Inject 0.3 mLs (0.3 mg total) into the muscle once.   Fish Oil 1000 MG Caps Take 1 capsule by mouth daily.   fluticasone 50 MCG/ACT nasal spray Commonly known as: FLONASE Place 2 sprays into both nostrils daily as needed for allergies.   Fluticasone-Salmeterol 250-50 MCG/DOSE Aepb Commonly known as: Wixela Inhub Inhale 1 puff into the lungs 2 (two) times daily.   furosemide 20 MG tablet Commonly known as: LASIX Take 1 tablet (20 mg total) by mouth daily as needed for edema.   levothyroxine 175 MCG tablet Commonly known as: SYNTHROID Take  1 tablet (175 mcg total) by mouth daily before breakfast.   lisinopril 10 MG tablet Commonly known as: ZESTRIL Take 3 tablets (30 mg total) by mouth daily. Take 1 tablet by mouth once daily   Multivitamin Adult Tabs Take 1 tablet by mouth daily.   Myrbetriq 50 MG Tb24 tablet Generic drug: mirabegron ER TAKE 1 TABLET BY MOUTH  DAILY   OCUVITE PO Take 1 tablet by mouth daily.   SUMAtriptan 50 MG tablet Commonly known as: Imitrex Take 1 tablet by mouth at start of migraine, may repeat in 2 hours if not improved. Max 2 tabs/24 hrs What changed:   how much to take  how to take this  when to take this  reasons to take this      Allergies  Allergen Reactions  . Bee Venom Anaphylaxis  . Contrast Media [Iodinated Diagnostic Agents] Shortness Of Breath  . Iodine Other (See Comments)    CARDIAC ARREST  . Latex Anaphylaxis and Rash  . Penicillins Shortness Of Breath, Rash and Other (See Comments)    SEIZURE Did it involve swelling of the face/tongue/throat, SOB, or low BP? no Did it involve sudden or severe rash/hives, skin peeling, or any reaction on the inside of your mouth or nose? yes Did you need to seek medical attention at a hospital or doctor's office? yes When did it last happen?2010 If all above answers are "NO", may proceed with cephalosporin use.   . Shellfish Allergy Shortness Of Breath and Rash  . Aspirin Rash and Other (See Comments)    GI UPSET  . Erythromycin Swelling and Rash    SWELLING OF JOINTS  . Lidocaine Swelling    SWELLING OF MOUTH AND THROAT  . Symbicort [Budesonide-Formoterol Fumarate] Other (See Comments)    BURNING OF TONGUE AND LIPS  . Betadine [Povidone Iodine]     Rash. Breathing problems.   . Codeine Rash  . Compazine [Prochlorperazine Maleate] Rash    Rash on face,chest, arms, back  . Pentazocine Lactate Rash  . Sulfonamide Derivatives Rash   Follow-up Information    Debbrah Alar, NP Follow up in 6 week(s).     Specialty: Internal Medicine Contact information: [address] [place] [address] [phone]            The results of significant diagnostics from this hospitalization (including imaging, microbiology, ancillary and laboratory) are listed below for reference.    Significant Diagnostic Studies: CT Head Wo Contrast  Result Date: 08/22/2019 CLINICAL DATA:  Headache EXAM: CT HEAD WITHOUT CONTRAST TECHNIQUE: Contiguous axial images were obtained from the base of  the skull through the vertex without intravenous contrast. COMPARISON:  None. FINDINGS: Brain: No evidence of acute infarction, hemorrhage, hydrocephalus, extra-axial collection or mass lesion/mass effect. Periventricular and deep white matter hypodensity. Vascular: No hyperdense vessel or unexpected calcification. Skull: Normal. Negative for fracture or focal lesion. Sinuses/Orbits: No acute finding. Other: None. IMPRESSION: 1. No acute intracranial pathology. No non-contrast CT findings to explain headache. 2.  Small-vessel white matter disease. Electronically Signed   By: Eddie Candle M.D.   On: 08/22/2019 15:17   MR ANGIO HEAD WO CONTRAST  Result Date: 08/23/2019 CLINICAL DATA:  Stroke follow-up. Vision changes. EXAM: MRA HEAD WITHOUT CONTRAST TECHNIQUE: Angiographic images of the Circle of Willis were obtained using MRA technique without intravenous contrast. COMPARISON:  None. FINDINGS: POSTERIOR CIRCULATION: --Vertebral arteries: Normal V4 segments. --Posterior inferior cerebellar arteries (PICA): Patent origins from the vertebral arteries. --Anterior inferior cerebellar arteries (AICA): Patent origins from the basilar artery. --Basilar artery: Normal. --Superior cerebellar arteries: Normal. --Posterior cerebral arteries: Normal. The right PCA is partially supplied by a posterior communicating artery (p-comm). ANTERIOR CIRCULATION: --Intracranial internal carotid arteries: Normal. --Anterior cerebral arteries  (ACA): Normal. Both A1 segments are present. Patent anterior communicating artery (a-comm). --Middle cerebral arteries (MCA): Normal. IMPRESSION: Normal brain MRA. No evidence of occlusion or high-grade stenosis. Electronically Signed   By: Ulyses Jarred M.D.   On: 08/23/2019 03:26   MR Brain Wo Contrast (neuro protocol)  Result Date: 08/22/2019 CLINICAL DATA:  Elevated blood pressure, neuro deficit EXAM: MRI HEAD WITHOUT CONTRAST TECHNIQUE: Multiplanar, multiecho pulse sequences of the brain and surrounding structures were obtained without intravenous contrast. COMPARISON:  None. FINDINGS: Brain: There is no acute infarction or intracranial hemorrhage. There is no intracranial mass, mass effect, or edema. There is no hydrocephalus or extra-axial fluid collection. Ventricles and sulci are within normal limits in size and configuration. Minimal small foci of T2 hyperintensity in the supratentorial white matter are nonspecific but may reflect minor chronic microvascular ischemic changes. Vascular: Major vessel flow voids at the skull base are preserved. Skull and upper cervical spine: Normal marrow signal is preserved. Sinuses/Orbits: Trace ethmoid mucosal thickening. Orbits are unremarkable. Other: Sella is unremarkable.  Mastoid air cells are clear. IMPRESSION: No evidence of recent infarction, hemorrhage, or mass. Electronically Signed   By: Macy Mis M.D.   On: 08/22/2019 18:37   ECHOCARDIOGRAM COMPLETE  Result Date: 08/23/2019    ECHOCARDIOGRAM REPORT   Patient Name:   [facility] Date of Exam: 08/23/2019 Medical Rec #:  [id number]         Height:       66.0 in Accession #:    [id number]        Weight:       286.6 lb Date of Birth:  November 11, 1955         BSA:          2.330 m Patient Age:    22 years          BP:           133/92 mmHg Patient Gender: F                 HR:           72 bpm. Exam Location:  Inpatient Procedure: 2D Echo, Cardiac Doppler, Color Doppler and Intracardiac             Opacification Agent Indications:    Stroke 434.91/I163.9  History:        Patient has prior  history of Echocardiogram examinations, most                 recent 03/06/2018. COPD; Risk Factors:Diabetes and Dyslipidemia.                 H/o breast cancer of left breast.  Sonographer:    Clayton Lefort RDCS (AE) Referring Phys: [id number] [facility] ADEFESO  Sonographer Comments: Technically difficult study due to poor echo windows and patient is morbidly obese. Image acquisition challenging due to patient body habitus. IMPRESSIONS  1. Left ventricular ejection fraction, by estimation, is 55 to 60%. The left ventricle has normal function. The left ventricle has no regional wall motion abnormalities. There is moderate left ventricular hypertrophy. Left ventricular diastolic parameters are consistent with Grade I diastolic dysfunction (impaired relaxation). No apical thrombus seen with Definity contrast.  2. Right ventricular systolic function was not well visualized. The right ventricular size is not well visualized.  3. The mitral valve is grossly normal. No evidence of mitral valve regurgitation.  4. The aortic valve was not well visualized. Aortic valve regurgitation is not visualized. No aortic stenosis is present.  5. The inferior vena cava is normal in size with greater than 50% respiratory variability, suggesting right atrial pressure of 3 mmHg. FINDINGS  Left Ventricle: Left ventricular ejection fraction, by estimation, is 55 to 60%. The left ventricle has normal function. The left ventricle has no regional wall motion abnormalities. Definity contrast agent was given IV to delineate the left ventricular  endocardial borders. The left ventricular internal cavity size was normal in size. There is moderate left ventricular hypertrophy. Left ventricular diastolic parameters are consistent with Grade I diastolic dysfunction (impaired relaxation). Indeterminate filling pressures. Right Ventricle: The right ventricular size is  not well visualized. Right vetricular wall thickness was not assessed. Right ventricular systolic function was not well visualized. Left Atrium: Left atrial size was normal in size. Right Atrium: Right atrial size was normal in size. Pericardium: There is no evidence of pericardial effusion. Mitral Valve: The mitral valve is grossly normal. No evidence of mitral valve regurgitation. MV peak gradient, 4.2 mmHg. The mean mitral valve gradient is 2.0 mmHg. Tricuspid Valve: The tricuspid valve is not well visualized. Tricuspid valve regurgitation is not demonstrated. Aortic Valve: The aortic valve was not well visualized. Aortic valve regurgitation is not visualized. No aortic stenosis is present. Pulmonic Valve: The pulmonic valve was not well visualized. Pulmonic valve regurgitation is not visualized. Aorta: The aortic root was not well visualized. Venous: The inferior vena cava is normal in size with greater than 50% respiratory variability, suggesting right atrial pressure of 3 mmHg. IAS/Shunts: The interatrial septum was not well visualized.  LEFT VENTRICLE PLAX 2D LVIDd:         4.00 cm  Diastology LVIDs:         3.00 cm  LV e' lateral:   8.70 cm/s LV PW:         1.20 cm  LV E/e' lateral: 8.5 LV IVS:        1.50 cm  LV e' medial:    5.77 cm/s LVOT diam:     2.00 cm  LV E/e' medial:  12.8 LV SV:         64 LV SV Index:   28 LVOT Area:     3.14 cm  RIGHT VENTRICLE             IVC RV Basal diam:  3.00 cm     IVC diam:  1.30 cm RV S prime:     15.00 cm/s TAPSE (M-mode): 2.9 cm LEFT ATRIUM             Index       RIGHT ATRIUM           Index LA diam:        3.50 cm 1.50 cm/m  RA Area:     13.90 cm LA Vol (A2C):   39.8 ml 17.08 ml/m RA Volume:   33.10 ml  14.20 ml/m LA Vol (A4C):   34.8 ml 14.93 ml/m LA Biplane Vol: 38.9 ml 16.69 ml/m  AORTIC VALVE AV Area (Vmax):    2.59 cm AV Area (Vmean):   2.54 cm AV Area (VTI):     2.90 cm AV Vmax:           136.00 cm/s AV Vmean:          89.700 cm/s AV VTI:             0.222 m AV Peak Grad:      7.4 mmHg AV Mean Grad:      4.0 mmHg LVOT Vmax:         112.00 cm/s LVOT Vmean:        72.600 cm/s LVOT VTI:          0.205 m LVOT/AV VTI ratio: 0.92  AORTA Ao Root diam: 2.60 cm MITRAL VALVE MV Area (PHT): 2.80 cm    SHUNTS MV Peak grad:  4.2 mmHg    Systemic VTI:  0.20 m MV Mean grad:  2.0 mmHg    Systemic Diam: 2.00 cm MV Vmax:       1.02 m/s MV Vmean:      68.0 cm/s MV Decel Time: 271 msec MV E velocity: 74.10 cm/s MV A velocity: 91.80 cm/s MV E/A ratio:  0.81 Lyman Bishop MD Electronically signed by Lyman Bishop MD Signature Date/Time: 08/23/2019/12:15:18 PM    Final    VAS US CAROTID  Result Date: 08/23/2019 Carotid Arterial Duplex Study Indications:       CVA. Risk Factors:      Hyperlipidemia. Limitations        Today's exam was limited due to the high bifurcation of the                    carotid, the body habitus of the patient and Deep vessels. Comparison Study:  no prior Performing Technologist: [name] RVS  Examination Guidelines: A complete evaluation includes B-mode imaging, spectral Doppler, color Doppler, and power Doppler as needed of all accessible portions of each vessel. Bilateral testing is considered an integral part of a complete examination. Limited examinations for reoccurring indications may be performed as noted.  Right Carotid Findings: +----------+--------+--------+--------+------------------+------------------+           PSV cm/sEDV cm/sStenosisPlaque DescriptionComments           +----------+--------+--------+--------+------------------+------------------+ CCA Prox  80      11              heterogenous                         +----------+--------+--------+--------+------------------+------------------+ CCA Distal87      24              heterogenous                         +----------+--------+--------+--------+------------------+------------------+ ICA Prox  84  24      1-39%   heterogenous                          +----------+--------+--------+--------+------------------+------------------+ ICA Distal                                          Not visualized     +----------+--------+--------+--------+------------------+------------------+ ECA       74      19                                limited visualized +----------+--------+--------+--------+------------------+------------------+ +----------+--------+-------+--------+-------------------+           PSV cm/sEDV cmsDescribeArm Pressure (mmHG) +----------+--------+-------+--------+-------------------+ [id number]                                         +----------+--------+-------+--------+-------------------+ +---------+--------+--------+--------------+ VertebralPSV cm/sEDV cm/sNot identified +---------+--------+--------+--------------+  Left Carotid Findings: +----------+--------+--------+--------+------------------+--------+           PSV cm/sEDV cm/sStenosisPlaque DescriptionComments +----------+--------+--------+--------+------------------+--------+ CCA Prox  157     14              heterogenous               +----------+--------+--------+--------+------------------+--------+ CCA Distal112     28              heterogenous               +----------+--------+--------+--------+------------------+--------+ ICA Prox  69      20      1-39%   heterogenous               +----------+--------+--------+--------+------------------+--------+ ICA Distal54      17                                         +----------+--------+--------+--------+------------------+--------+ ECA       97      19                                         +----------+--------+--------+--------+------------------+--------+ +----------+--------+--------+--------+-------------------+           PSV cm/sEDV cm/sDescribeArm Pressure (mmHG) +----------+--------+--------+--------+-------------------+ [id number]                                          +----------+--------+--------+--------+-------------------+ +---------+--------+--+--------+--+---------+ VertebralPSV cm/s59EDV cm/s10Antegrade +---------+--------+--+--------+--+---------+   Summary: Right Carotid: Velocities in the right ICA are consistent with a 1-39% stenosis. Left Carotid: Velocities in the left ICA are consistent with a 1-39% stenosis. Vertebrals: Bilateral vertebral arteries demonstrate antegrade flow. *See table(s) above for measurements and observations.     Preliminary     Microbiology: Recent Results (from the past 240 hour(s))  Respiratory Panel by RT PCR (Flu A&B, Covid) - Nasopharyngeal Swab     Status: None   Collection Time: 08/22/19  4:20 PM   Specimen: Nasopharyngeal Swab  Result Value Ref Range Status   SARS Coronavirus 2 by RT  PCR NEGATIVE NEGATIVE Final    Comment: (NOTE) SARS-CoV-2 target nucleic acids are NOT DETECTED. The SARS-CoV-2 RNA is generally detectable in upper respiratoy specimens during the acute phase of infection. The lowest concentration of SARS-CoV-2 viral copies this assay can detect is 131 copies/mL. A negative result does not preclude SARS-Cov-2 infection and should not be used as the sole basis for treatment or other patient management decisions. A negative result may occur with  improper specimen collection/handling, submission of specimen other than nasopharyngeal swab, presence of viral mutation(s) within the areas targeted by this assay, and inadequate number of viral copies (<131 copies/mL). A negative result must be combined with clinical observations, patient history, and epidemiological information. The expected result is Negative. Fact Sheet for Patients:  PinkCheek.be Fact Sheet for Healthcare Providers:  GravelBags.it This test is not yet ap proved or cleared by the [place] FDA and  has been authorized for detection and/or  diagnosis of SARS-CoV-2 by FDA under an Emergency Use Authorization (EUA). This EUA will remain  in effect (meaning this test can be used) for the duration of the COVID-19 declaration under Section 564(b)(1) of the Act, 21 U.S.C. section 360bbb-3(b)(1), unless the authorization is terminated or revoked sooner.    Influenza A by PCR NEGATIVE NEGATIVE Final   Influenza B by PCR NEGATIVE NEGATIVE Final    Comment: (NOTE) The Xpert Xpress SARS-CoV-2/FLU/RSV assay is intended as an aid in  the diagnosis of influenza from Nasopharyngeal swab specimens and  should not be used as a sole basis for treatment. Nasal washings and  aspirates are unacceptable for Xpert Xpress SARS-CoV-2/FLU/RSV  testing. Fact Sheet for Patients: PinkCheek.be Fact Sheet for Healthcare Providers: GravelBags.it This test is not yet approved or cleared by the [place] FDA and  has been authorized for detection and/or diagnosis of SARS-CoV-2 by  FDA under an Emergency Use Authorization (EUA). This EUA will remain  in effect (meaning this test can be used) for the duration of the  Covid-19 declaration under Section 564(b)(1) of the Act, 21  U.S.C. section 360bbb-3(b)(1), unless the authorization is  terminated or revoked. Performed at [facility], [address] [place], [place] [address]      Labs: Basic Metabolic Panel: Recent Labs  Lab 08/22/19 1444 08/23/19 0217  NA 138 142  K 3.9 4.2  CL 102 105  CO2 27 27  GLUCOSE 111* 93  BUN 13 10  CREATININE 0.87 0.81  CALCIUM 9.3 9.4   Liver Function Tests: Recent Labs  Lab 08/23/19 0217  AST 22  ALT 41  ALKPHOS 62  BILITOT 0.9  PROT 5.9*  ALBUMIN 3.4*   No results for input(s): LIPASE, AMYLASE in the last 168 hours. No results for input(s): AMMONIA in the last 168 hours. CBC: Recent Labs  Lab 08/22/19 1444 08/23/19 0217  WBC 6.5 5.5  HGB 14.6 13.5  HCT 46.2* 43.4  MCV  78.7* 78.8*  PLT 282 275   Cardiac Enzymes: No results for input(s): CKTOTAL, CKMB, CKMBINDEX, TROPONINI in the last 168 hours. BNP: BNP (last 3 results) No results for input(s): BNP in the last 8760 hours.  ProBNP (last 3 results) Recent Labs    07/30/19 1230  PROBNP 51.0    CBG: No results for input(s): GLUCAP in the last 168 hours.     Signed:  Annita Brod, MD [facility] 08/23/2019, 3:33 PM

## 2019-08-26 ENCOUNTER — Encounter: Payer: Self-pay | Admitting: Family

## 2019-09-09 ENCOUNTER — Other Ambulatory Visit: Payer: Self-pay

## 2019-09-09 MED ORDER — LISINOPRIL 10 MG PO TABS: 10.0000 mg | ORAL_TABLET | Freq: Every day | ORAL | 3 refills | Status: DC

## 2019-09-14 ENCOUNTER — Encounter: Payer: Self-pay | Admitting: Family

## 2019-09-15 ENCOUNTER — Encounter: Payer: Self-pay | Admitting: Family

## 2019-09-15 MED ORDER — ATORVASTATIN CALCIUM 40 MG PO TABS: 40.0000 mg | ORAL_TABLET | Freq: Every day | ORAL | 0 refills | Status: DC

## 2019-09-15 MED ORDER — BLOOD PRESSURE KIT: PACK | 0 refills | Status: DC

## 2019-09-15 MED ORDER — TERBINAFINE HCL 250 MG PO TABS: 250.0000 mg | ORAL_TABLET | Freq: Every day | ORAL | 1 refills | Status: DC

## 2019-09-24 ENCOUNTER — Telehealth: Payer: Self-pay | Admitting: Family

## 2019-09-24 ENCOUNTER — Encounter (HOSPITAL_COMMUNITY): Payer: Self-pay

## 2019-09-24 ENCOUNTER — Emergency Department (HOSPITAL_COMMUNITY)
Admission: EM | Admit: 2019-09-24 | Discharge: 2019-09-24 | Disposition: A | Discharge status: Home / Self Care | Payer: 59 | Attending: Emergency Medicine | Admitting: Emergency Medicine

## 2019-09-24 ENCOUNTER — Other Ambulatory Visit: Payer: Self-pay

## 2019-09-24 DIAGNOSIS — E039 Hypothyroidism, unspecified: Secondary | ICD-10-CM | POA: Diagnosis not present

## 2019-09-24 DIAGNOSIS — I110 Hypertensive heart disease with heart failure: Secondary | ICD-10-CM | POA: Insufficient documentation

## 2019-09-24 DIAGNOSIS — Z91040 Latex allergy status: Secondary | ICD-10-CM | POA: Insufficient documentation

## 2019-09-24 DIAGNOSIS — Z96652 Presence of left artificial knee joint: Secondary | ICD-10-CM | POA: Diagnosis not present

## 2019-09-24 DIAGNOSIS — M069 Rheumatoid arthritis, unspecified: Secondary | ICD-10-CM | POA: Diagnosis not present

## 2019-09-24 DIAGNOSIS — Z79899 Other long term (current) drug therapy: Secondary | ICD-10-CM | POA: Diagnosis not present

## 2019-09-24 DIAGNOSIS — I5042 Chronic combined systolic (congestive) and diastolic (congestive) heart failure: Secondary | ICD-10-CM | POA: Insufficient documentation

## 2019-09-24 DIAGNOSIS — J449 Chronic obstructive pulmonary disease, unspecified: Secondary | ICD-10-CM | POA: Insufficient documentation

## 2019-09-24 DIAGNOSIS — H538 Other visual disturbances: Secondary | ICD-10-CM | POA: Insufficient documentation

## 2019-09-24 DIAGNOSIS — Z7901 Long term (current) use of anticoagulants: Secondary | ICD-10-CM | POA: Diagnosis not present

## 2019-09-24 DIAGNOSIS — Z853 Personal history of malignant neoplasm of breast: Secondary | ICD-10-CM | POA: Diagnosis not present

## 2019-09-24 NOTE — ED Provider Notes (Signed)
[facility] DEPT Provider Note   CSN: [id number] Arrival date & time: 09/24/19  1508     History Chief Complaint  Patient presents with   Hypertension   Blurred Vision   Diplopia    Michelle Kennedy is a 64 y.o. female.  The history is provided by the patient.  Hypertension This is a chronic problem. The problem occurs daily. The problem has not changed since onset.Pertinent negatives include no chest pain, no abdominal pain, no headaches and no shortness of breath. 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 Follow up with eye doctor shows no new signs of stroke in eye. However, BP was high, vision loss improved but now double vision in right eye. PCP sent due to BP being high. . Nothing aggravates the symptoms. Nothing relieves the symptoms. She has tried nothing for the symptoms. The treatment provided no relief.       Past Medical History:  Diagnosis Date   Abnormally small mouth    Achilles tendon disorder, right    Anemia    Anxiety    Arthritis    hips and knees   Asthma    daily inhaler, prn inhaler and neb.   Asthma due to environmental allergies    Breast cancer ([place]) 01/2014   left   Cancer [facility])    Chest pain    COPD (chronic obstructive pulmonary disease) (HCC)    Dental bridge present    upper front and lower right   Dental crowns present    x 3   Depression    Esophageal spasm    reports since her chemo and breast surgery  she developed esophageal spasms and reports this is in the past has casue her 02 to desat in the  70s , denies sycnope in relation to this , does report hx of vertigo as well    Family history of anesthesia complication    twin brother aspirated and died on OR table, per pt.   Fibromyalgia    Gallbladder disease    GERD (gastroesophageal reflux disease)     H/O blood clots    had blood to  PICC line    History of gastric ulcer    as a teenager   History of seizure age 81   as a reaction to Penicillin - no seizures since   History of stomach ulcers    History of thyroid cancer    s/p thyroidectomy   Hyperlipidemia    Hypothyroidism    Joint pain    Left knee injury    Liver disorder    seen when she had her hysterectomy , reports she was told it was  " small lesion" ; but asymptomatic    Lymphedema    left arm   Migraines    Multiple food allergies    Obesity    OSA (obstructive sleep apnea) 02/16/2016   Palpitations    reports no longer experiences   Personal history of chemotherapy 2015   Personal history of radiation therapy 2015   Left   Prediabetes    Rheumatoid arthritis (HCC)    SOB (shortness of breath)    Swelling of both ankles    Tinnitus    UTI (lower urinary tract infection) 02/17/2014   Vertigo    last episode  was 2 weeks ago    Vitamin D deficiency  Wears contact lenses    left eye only    Wears hearing aid in both ears     Patient Active Problem List   Diagnosis Date Noted   Noncompliance with medications 08/23/2019   Chronic diastolic CHF (congestive heart failure) ([place]) 08/23/2019   Retinal artery occlusion 08/22/2019   OA (osteoarthritis) of knee 12/03/2017   Class 3 obesity without serious comorbidity with body mass index (BMI) of 40.0 to 44.9 in adult 01/01/2017   Essential hypertension 10/18/2016   Constipation 10/04/2016   Vitamin D deficiency 10/04/2016   Prediabetes 10/04/2016   Obesity, Class III, BMI 40-49.9 (morbid obesity) ([place]) 09/20/2016   Other fatigue 09/20/2016   OSA (obstructive sleep apnea) 02/16/2016   Right ankle pain 01/20/2016   Strain of right Achilles tendon 11/30/2015   Benign hypertension 10/11/2015   Malignant neoplasm of thyroid gland ([place]) 10/11/2015   De Quervain's tenosynovitis, bilateral 09/15/2015   Right shoulder  pain 09/15/2015   Trigger finger, acquired 06/22/2015   Left wrist pain 06/22/2015   Thumb injury 06/14/2015   Chemotherapy-induced peripheral neuropathy ([place]) 05/10/2015   Postmenopausal osteoporosis 12/08/2014   Preventative health care 12/01/2014   DVT (deep venous thrombosis) ([place]) 08/28/2014   Lymphedema of arm 08/28/2014   Mucositis due to chemotherapy 06/02/2014   UTI symptoms 02/17/2014   Menopausal syndrome (hot flashes) 12/31/2013   Breast cancer of upper-outer quadrant of left female breast ([place]) 12/26/2013   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   Gingivitis 12/05/2013   Seasonal allergies 09/24/2013   Skin lesion 06/03/2013   Menopause 06/02/2013   Elevated blood-pressure reading without diagnosis of hypertension 03/05/2013   Cough 07/24/2012   Left knee injury 11/02/2011   Gout 09/19/2011   Hyperlipidemia 06/02/2011   Migraine 06/02/2011   Tinnitus of both ears 02/14/2011   Left knee pain 09/23/2010   FIBROMYALGIA 05/10/2010   Asthma 03/22/2010   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   OVERWEIGHT 12/20/2009   GERD 12/20/2009   Depression 12/01/2009    Past Surgical History:  Procedure Laterality Date   ABDOMINAL HYSTERECTOMY  2004   complete   ACHILLES TENDON REPAIR Right    APPENDECTOMY  2004   BREAST EXCISIONAL BIOPSY     BREAST LUMPECTOMY Left    2015   BREAST LUMPECTOMY WITH NEEDLE LOCALIZATION AND AXILLARY SENTINEL LYMPH NODE BX Left 02/23/2014   Procedure: BREAST LUMPECTOMY WITH NEEDLE LOCALIZATION AND AXILLARY SENTINEL LYMPH NODE BIOPSY;  Surgeon: Excell Seltzer, MD;  Location: [facility];  Service: General;  Laterality: Left;   BREAST SURGERY  2011   left breast biposy   CHOLECYSTECTOMY  1990   COLONOSCOPY W/ POLYPECTOMY  06/2009   EYE SURGERY Right 2013   exc. warts from underneath eyelid   INCONTINENCE SURGERY  2004   KNEE ARTHROSCOPY Bilateral    x 6 each knee     LIGAMENT REPAIR Right    thumb/wrist   ORIF TOE FRACTURE Right    great toe   PORTACATH PLACEMENT N/A 03/24/2014   Procedure: INSERTION PORT-A-CATH;  Surgeon: Excell Seltzer, MD;  Location: [facility];  Service: General;  Laterality: N/A;; removed    THYROIDECTOMY  2000   TONSILLECTOMY AND ADENOIDECTOMY  2000   TOTAL KNEE ARTHROPLASTY Left 12/03/2017   Procedure: LEFT TOTAL KNEE ARTHROPLASTY;  Surgeon: Gaynelle Arabian, MD;  Location: [facility];  Service: Orthopedics;  Laterality: Left;   TUMOR EXCISION     from thoracic spine     OB  History    Gravida  3   Para  3   Term  3   Preterm      AB      Living  3     SAB      TAB      Ectopic      Multiple      Live Births              Family History  Problem Relation Age of Onset   Alcohol abuse Mother    Arthritis Mother    Hypertension Mother    Bipolar disorder Mother    Breast cancer Mother 63       unconfirmed   Lung cancer Mother 49       smoker   Hyperlipidemia Mother    Stroke Mother    Cancer Mother    Depression Mother    Anxiety disorder Mother    Alcoholism Mother    Drug abuse Mother    Eating disorder Mother    Obesity Mother    Alcohol abuse Father    Cancer Father        lung   Hyperlipidemia Father    Kidney disease Father    Diabetes Father    Hypertension Father    Cystic kidney disease Father    Thyroid disease Father    Liver disease Father    Alcoholism Father    Arthritis Maternal Grandmother    Diabetes Paternal Grandmother    Thyroid cancer Sister 16       type?; currently 44   Other Sister        ovarian tumor @ 46; TAH/BSO   Breast cancer Sister    Thyroid cancer Brother        dx 58s; currently 52   Breast cancer Maternal Aunt        dx 76s; deceased 76   Thyroid cancer Paternal 21        All 3 paternal aunts with thyroid ca in 30s/40s   Lung cancer Paternal Aunt        2 of 3 paternal aunts with lung  cancer    Social History   Tobacco Use   Smoking status: Never Smoker   Smokeless tobacco: Never Used  Substance Use Topics   Alcohol use: No    Alcohol/week: 0.0 standard drinks   Drug use: No    Home Medications Prior to Admission medications   Medication Sig Start Date End Date Taking? Authorizing Provider  albuterol (PROVENTIL) (5 MG/ML) 0.5% nebulizer solution Take 0.5 mLs (2.5 mg total) by nebulization every 6 (six) hours as needed for wheezing or shortness of breath. 07/31/18  Yes Debbrah Alar, NP  albuterol (VENTOLIN HFA) 108 (90 Base) MCG/ACT inhaler Inhale 2 puffs into the lungs every 6 (six) hours as needed for wheezing or shortness of breath.   Yes [provider]  ALPHAGAN P 0.1 % SOLN Place 1 drop into both eyes in the morning and at bedtime.  09/09/19  Yes [provider]  anastrozole (ARIMIDEX) 1 MG tablet Take 1 tablet (1 mg total) by mouth daily. 07/23/19  Yes Nicholas Lose, MD  atorvastatin (LIPITOR) 40 MG tablet Take 1 tablet (40 mg total) by mouth daily at 6 PM. 09/15/19  Yes Debbrah Alar, NP  CALCIUM PO Take 1 tablet by mouth every evening.   Yes [provider]  cholecalciferol (VITAMIN D3) 25 MCG (1000 UNIT) tablet Take 1,000 Units by mouth every  evening.    Yes [provider]  clopidogrel (PLAVIX) 75 MG tablet Take 1 tablet (75 mg total) by mouth daily. Patient taking differently: Take 75 mg by mouth every evening.  08/24/19  Yes Annita Brod, MD  EPINEPHrine 0.3 mg/0.3 mL IJ SOAJ injection Inject 0.3 mLs (0.3 mg total) into the muscle once. Patient taking differently: Inject 0.3 mg into the muscle as needed for anaphylaxis.  09/24/13  Yes Debbrah Alar, NP  fluticasone (FLONASE) 50 MCG/ACT nasal spray Place 2 sprays into both nostrils daily as needed for allergies. 08/23/19  Yes Annita Brod, MD  Fluticasone-Salmeterol [facility] INHUB) 250-50 MCG/DOSE AEPB Inhale 1 puff into the lungs 2 (two) times  daily. 07/05/18  Yes Debbrah Alar, NP  furosemide (LASIX) 20 MG tablet Take 1 tablet (20 mg total) by mouth daily as needed for edema. 08/06/19  Yes Debbrah Alar, NP  levothyroxine (SYNTHROID) 200 MCG tablet Take 200 mcg by mouth daily before breakfast.  09/09/19  Yes [provider]  lisinopril (ZESTRIL) 10 MG tablet Take 1 tablet (10 mg total) by mouth daily. Take 1 tablet by mouth once daily 09/09/19  Yes Debbrah Alar, NP  Multiple Vitamin (MULTIVITAMIN ADULT) TABS Take 1 tablet by mouth daily.   Yes [provider]  Multiple Vitamins-Minerals (OCUVITE PO) Take 1 tablet by mouth daily.   Yes [provider]  MYRBETRIQ 50 MG TB24 tablet TAKE 1 TABLET BY MOUTH  DAILY 06/16/19  Yes Debbrah Alar, NP  Omega-3 Fatty Acids (FISH OIL) 1000 MG CAPS Take 1 capsule by mouth every evening.    Yes [provider]  SUMAtriptan (IMITREX) 50 MG tablet Take 1 tablet by mouth at start of migraine, may repeat in 2 hours if not improved. Max 2 tabs/24 hrs Patient taking differently: Take 50 mg by mouth every 2 (two) hours as needed for migraine. Take 1 tablet by mouth at start of migraine, may repeat in 2 hours if not improved. Max 2 tabs/24 hrs 01/13/19  Yes Debbrah Alar, NP  terbinafine (LAMISIL) 250 MG tablet Take 1 tablet (250 mg total) by mouth daily. 09/15/19  Yes Debbrah Alar, NP  albuterol (PROAIR HFA) 108 (90 Base) MCG/ACT inhaler Inhale 2 puffs into the lungs every 6 (six) hours as needed for wheezing or shortness of breath. INHALE 2 PUFFS INTO THE LUNGS EVERY 6 HOURS AS NEEDED 05/13/19 08/11/19  Debbrah Alar, NP  Blood Pressure KIT Check blood sugar as directed 09/15/19   Debbrah Alar, NP  levothyroxine (SYNTHROID) 175 MCG tablet Take 1 tablet (175 mcg total) by mouth daily before breakfast. Patient not taking: Reported on 09/24/2019 07/18/19   Philemon Kingdom, MD  meloxicam (MOBIC) 7.5 MG tablet TAKE 1 TABLET BY MOUTH  DAILY  10/19/18   Debbrah Alar, NP    Allergies    Bee venom, Contrast media [iodinated diagnostic agents], Iodine, Latex, Penicillins, Shellfish allergy, Aspirin, Erythromycin, Lidocaine, Symbicort [budesonide-formoterol fumarate], Betadine [povidone iodine], Codeine, Compazine [prochlorperazine maleate], Pentazocine lactate, and Sulfonamide derivatives  Review of Systems   Review of Systems  Constitutional: Negative for chills and fever.  HENT: Negative for ear pain and sore throat.   Eyes: Positive for visual disturbance. Negative for photophobia, pain, discharge, redness and itching.  Respiratory: Negative for cough and shortness of breath.   Cardiovascular: Negative for chest pain and palpitations.  Gastrointestinal: Negative for abdominal pain and vomiting.  Genitourinary: Negative for dysuria and hematuria.  Musculoskeletal: Negative for arthralgias and back pain.  Skin: Negative for  color change and rash.  Neurological: Negative for dizziness, tremors, seizures, syncope, facial asymmetry, speech difficulty, weakness, light-headedness, numbness and headaches.  All other systems reviewed and are negative.   Physical Exam Updated Vital Signs  ED Triage Vitals [09/24/19 1521]  Enc Vitals Group     BP (!) 180/133     Pulse Rate 84     Resp (!) 22     Temp 98.1 F (36.7 C)     Temp Source Oral     SpO2 96 %     Weight      Height      Head Circumference      Peak Flow      Pain Score      Pain Loc      Pain Edu?      Excl. in [place]?     Physical Exam Vitals and nursing note reviewed.  Constitutional:      General: She is not in acute distress.    Appearance: She is well-developed. She is not ill-appearing.  HENT:     Head: Normocephalic and atraumatic.     Nose: Nose normal.     Mouth/Throat:     Mouth: Mucous membranes are moist.  Eyes:     Extraocular Movements: Extraocular movements intact.     Conjunctiva/sclera: Conjunctivae normal.     Pupils: Pupils are  equal, round, and reactive to light.     Comments: 20/30 vision in each eye, patient states diplopia with right eye but does not improve when covers left eye, no visual field deficit, no left eye double vision  Cardiovascular:     Rate and Rhythm: Normal rate and regular rhythm.     Pulses: Normal pulses.     Heart sounds: Normal heart sounds. No murmur.  Pulmonary:     Effort: Pulmonary effort is normal. No respiratory distress.     Breath sounds: Normal breath sounds.  Abdominal:     Palpations: Abdomen is soft.     Tenderness: There is no abdominal tenderness.  Musculoskeletal:        General: No tenderness. Normal range of motion.     Cervical back: Normal range of motion and neck supple. No rigidity or tenderness.  Skin:    General: Skin is warm and dry.  Neurological:     General: No focal deficit present.     Mental Status: She is alert and oriented to person, place, and time.     Cranial Nerves: No cranial nerve deficit.     Sensory: No sensory deficit.     Motor: No weakness.     Coordination: Coordination normal.     Gait: Gait normal.     Deep Tendon Reflexes: Reflexes abnormal.  Psychiatric:        Mood and Affect: Mood normal.     ED Results / Procedures / Treatments   Labs (all labs ordered are listed, but only abnormal results are displayed) Labs Reviewed - No data to display  EKG EKG Interpretation  Date/Time:  Wednesday Sep 24 2019 16:41:20 EDT Ventricular Rate:  72 PR Interval:    QRS Duration: 97 QT Interval:  417 QTC Calculation: 457 R Axis:   58 Text Interpretation: Sinus rhythm Atrial premature complex Low voltage, precordial leads Confirmed by Lennice Sites [phone]) on 09/24/2019 5:10:28 PM   Radiology No results found.  Procedures Procedures (including critical care time)  Medications Ordered in ED Medications - No data to display  ED Course  I have reviewed the triage vital signs and the nursing notes.  Pertinent labs & imaging  results that were available during my care of the patient were reviewed by me and considered in my medical decision making (see chart for details).    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  Blood pressure upon my evaluation is 153/79.  Otherwise normal vitals.  Followed up with her ophthalmologist today after being diagnosed with right ophthalmic artery occlusion.  She started to have some double vision yesterday after having significant peripheral vision loss.  She does not have any double vision in the left eye.  Double vision in the right eye is there when she covers the left eye as well.  Otherwise there is no visual field deficit.  She had overall normal work-up with an ophthalmologist.  Her blood pressure is slightly high at their office and ophthalmologist thought that was likely the cause.  Primary care doctor sent her for evaluation due to high blood pressure.  Neurological exam is normal.  Eye exam overall unremarkable as well.  Given that she is having monocular double vision this is likely more intrinsic eye issue than stroke.  She does not have double vision in both eyes.  She had a recent MRI and MRA of the brain that was unremarkable.  She had complete stroke work-up several weeks ago.  She is on Plavix and blood pressure meds and cholesterol medication.  Symptoms are likely secondary to intrinsic eye issue versus healing from total vision loss in that eye.  Talked with Dr. Cheral Marker with neurology who states that given this findings no need for further work-up at this time.  She can follow-up with neurology and her primary care doctor for further optimization of her medications.  She understands return precautions and was discharged in ED in good condition.  This  chart was dictated using voice recognition software.  Despite best efforts to proofread,  errors can occur which can change the documentation meaning.     Final Clinical Impression(s) / ED Diagnoses Final diagnoses:  Blurred vision    Rx / DC Orders ED Discharge Orders    None       Lennice Sites, DO 09/24/19 1715

## 2019-09-24 NOTE — ED Triage Notes (Signed)
Note per patients chart earlier :   "Received call from Dr. Hoyle Sauer- pt's optometrist.  She stated that the patient has monocular and binocular diplopia.  Previously had diplopia back on 08/22/19 which had been present for 2 weeks.  She was admitted to undergo stroke work up.  She was diagnosed with a retinal artery occlusion, had negative echo. Neuro recommended starting plavix 75mg  daily.  Per optometrist her diplopia had resolved but now returned. They did not feel that there are any ophthalmic causes for her diplopia.  She is apparently scheduled to fly out to [place] tomorrow to see her new grandchild. BP at her eye appointment today was 149/85.    Right lateral visual field was affected initially back in early April.   Today she has "stacking" of her vision R>L but is seeing these changes in both eyes. She denies HA but does have some nausea. "

## 2019-09-24 NOTE — Telephone Encounter (Signed)
Received call from Dr. Hoyle Sauer- pt's optometrist.  She stated that the patient has monocular and binocular diplopia.  Previously had diplopia back on 08/22/19 which had been present for 2 weeks.  She was admitted to undergo stroke work up.  She was diagnosed with a retinal artery occlusion, had negative echo. Neuro recommended starting plavix 75mg  daily.  Per optometrist her diplopia had resolved but now returned. They did not feel that there are any ophthalmic causes for her diplopia.  She is apparently scheduled to fly out to [place] tomorrow to see her new grandchild. BP at her eye appointment today was 149/85.    Right lateral visual field was affected initially back in early April.   Today she has "stacking" of her vision R>L but is seeing these changes in both eyes. She denies HA but does have some nausea.   BP Readings from Last 3 Encounters:  08/23/19 (!) 133/92  08/13/19 139/84  08/06/19 (!) 146/85   I advised the pt to go to the ER. She verbalizes understanding and is agreeable.

## 2019-10-17 ENCOUNTER — Ambulatory Visit (INDEPENDENT_AMBULATORY_CARE_PROVIDER_SITE_OTHER): Payer: 59 | Admitting: Family

## 2019-10-17 ENCOUNTER — Encounter: Payer: Self-pay | Admitting: Family

## 2019-10-17 ENCOUNTER — Other Ambulatory Visit: Payer: Self-pay

## 2019-10-17 ENCOUNTER — Encounter: Payer: Self-pay | Admitting: Neurology

## 2019-10-17 VITALS — BP 127/85 | HR 60 | Temp 97.5°F | Resp 16 | Ht 66.0 in | Wt 291.0 lb

## 2019-10-17 DIAGNOSIS — M5441 Lumbago with sciatica, right side: Secondary | ICD-10-CM | POA: Diagnosis not present

## 2019-10-17 DIAGNOSIS — I10 Essential (primary) hypertension: Secondary | ICD-10-CM

## 2019-10-17 DIAGNOSIS — H349 Unspecified retinal vascular occlusion: Secondary | ICD-10-CM | POA: Diagnosis not present

## 2019-10-17 MED ORDER — MELOXICAM 7.5 MG PO TABS: 7.5000 mg | ORAL_TABLET | Freq: Every day | ORAL | 0 refills | Status: DC

## 2019-10-17 MED ORDER — METHOCARBAMOL 500 MG PO TABS: 500.0000 mg | ORAL_TABLET | Freq: Three times a day (TID) | ORAL | 0 refills | Status: DC | PRN

## 2019-10-17 NOTE — Patient Instructions (Signed)
Please begin meloxican once daily as need. Robaxin may be used 3 times daily as needed. (muscle relaxer).   Sciatica  Sciatica is pain, numbness, weakness, or tingling along the path of the sciatic nerve. The sciatic nerve starts in the lower back and runs down the back of each leg. The nerve controls the muscles in the lower leg and in the back of the knee. It also provides feeling (sensation) to the back of the thigh, the lower leg, and the sole of the foot. Sciatica is a symptom of another medical condition that pinches or puts pressure on the sciatic nerve. Sciatica most often only affects one side of the body. Sciatica usually goes away on its own or with treatment. In some cases, sciatica may come back (recur). What are the causes? This condition is caused by pressure on the sciatic nerve or pinching of the nerve. This may be the result of:  A disk in between the bones of the spine bulging out too far (herniated disk).  Age-related changes in the spinal disks.  A pain disorder that affects a muscle in the buttock.  Extra bone growth near the sciatic nerve.  A break (fracture) of the pelvis.  Pregnancy.  Tumor. This is rare. What increases the risk? The following factors may make you more likely to develop this condition:  Playing sports that place pressure or stress on the spine.  Having poor strength and flexibility.  A history of back injury or surgery.  Sitting for long periods of time.  Doing activities that involve repetitive bending or lifting.  Obesity. What are the signs or symptoms? Symptoms can vary from mild to very severe, and they may include:  Any of these problems in the lower back, leg, hip, or buttock: ? Mild tingling, numbness, or dull aches. ? Burning sensations. ? Sharp pains.  Numbness in the back of the calf or the sole of the foot.  Leg weakness.  Severe back pain that makes movement difficult. Symptoms may get worse when you cough,  sneeze, or laugh, or when you sit or stand for long periods of time. How is this diagnosed? This condition may be diagnosed based on:  Your symptoms and medical history.  A physical exam.  Blood tests.  Imaging tests, such as: ? X-rays. ? MRI. ? CT scan. How is this treated? In many cases, this condition improves on its own without treatment. However, treatment may include:  Reducing or modifying physical activity.  Exercising and stretching.  Icing and applying heat to the affected area.  Medicines that help to: ? Relieve pain and swelling. ? Relax your muscles.  Injections of medicines that help to relieve pain, irritation, and inflammation around the sciatic nerve (steroids).  Surgery. Follow these instructions at home: Medicines  Take over-the-counter and prescription medicines only as told by your health care provider.  Ask your health care provider if the medicine prescribed to you: ? Requires you to avoid driving or using heavy machinery. ? Can cause constipation. You may need to take these actions to prevent or treat constipation:  Drink enough fluid to keep your urine pale yellow.  Take over-the-counter or prescription medicines.  Eat foods that are high in fiber, such as beans, whole grains, and fresh fruits and vegetables.  Limit foods that are high in fat and processed sugars, such as fried or sweet foods. Managing pain      If directed, put ice on the affected area. ? Put ice in a  plastic bag. ? Place a towel between your skin and the bag. ? Leave the ice on for 20 minutes, 2-3 times a day.  If directed, apply heat to the affected area. Use the heat source that your health care provider recommends, such as a moist heat pack or a heating pad. ? Place a towel between your skin and the heat source. ? Leave the heat on for 20-30 minutes. ? Remove the heat if your skin turns bright red. This is especially important if you are unable to feel pain,  heat, or cold. You may have a greater risk of getting burned. Activity   Return to your normal activities as told by your health care provider. Ask your health care provider what activities are safe for you.  Avoid activities that make your symptoms worse.  Take brief periods of rest throughout the day. ? When you rest for longer periods, mix in some mild activity or stretching between periods of rest. This will help to prevent stiffness and pain. ? Avoid sitting for long periods of time without moving. Get up and move around at least one time each hour.  Exercise and stretch regularly, as told by your health care provider.  Do not lift anything that is heavier than 10 lb (4.5 kg) while you have symptoms of sciatica. When you do not have symptoms, you should still avoid heavy lifting, especially repetitive heavy lifting.  When you lift objects, always use proper lifting technique, which includes: ? Bending your knees. ? Keeping the load close to your body. ? Avoiding twisting. General instructions  Maintain a healthy weight. Excess weight puts extra stress on your back.  Wear supportive, comfortable shoes. Avoid wearing high heels.  Avoid sleeping on a mattress that is too soft or too hard. A mattress that is firm enough to support your back when you sleep may help to reduce your pain.  Keep all follow-up visits as told by your health care provider. This is important. Contact a health care provider if:  You have pain that: ? Wakes you up when you are sleeping. ? Gets worse when you lie down. ? Is worse than you have experienced in the past. ? Lasts longer than 4 weeks.  You have an unexplained weight loss. Get help right away if:  You are not able to control when you urinate or have bowel movements (incontinence).  You have: ? Weakness in your lower back, pelvis, buttocks, or legs that gets worse. ? Redness or swelling of your back. ? A burning sensation when you  urinate. Summary  Sciatica is pain, numbness, weakness, or tingling along the path of the sciatic nerve.  This condition is caused by pressure on the sciatic nerve or pinching of the nerve.  Sciatica can cause pain, numbness, or tingling in the lower back, legs, hips, and buttocks.  Treatment often includes rest, exercise, medicines, and applying ice or heat. This information is not intended to replace advice given to you by your health care provider. Make sure you discuss any questions you have with your health care provider. Document Revised: 05/27/2018 Document Reviewed: 05/27/2018 Elsevier Patient Education  [place].

## 2019-10-17 NOTE — Progress Notes (Signed)
Subjective:    Patient ID: Michelle Kennedy, female    DOB: 13-Feb-1956, 64 y.o.   MRN: [id number]  HPI  Patient is a 64 yr old female who presents today with chief complaint of back pain. She reports that back pain began 1 month ago and radiates down the right buttock and back of the right leg.  Pain is worsening. Has tried heat, massage, tylenol, heat without much improvement in her symptoms.  Denies loss of bowel/bladder. Denies leg numbness. Occasional mild weakness of RLE, "when I have a spasm."    HTN- [id number] systolic at home.  Right Artery Occlusion of Ophthalmic Artery- Reports that her "eye thing is the same." She is still seeing images "stacked".  She has seen optometry as well as an ER visit where they performed and MRI.   Review of Systems See HPI  Past Medical History:  Diagnosis Date  . Abnormally small mouth   . Achilles tendon disorder, right   . Anemia   . Anxiety   . Arthritis    hips and knees  . Asthma    daily inhaler, prn inhaler and neb.  . Asthma due to environmental allergies   . Breast cancer ([place]) 01/2014   left  . Cancer ([place])   . Chest pain   . COPD (chronic obstructive pulmonary disease) ([place])   . Dental bridge present    upper front and lower right  . Dental crowns present    x 3  . Depression   . Esophageal spasm    reports since her chemo and breast surgery  she developed esophageal spasms and reports this is in the past has casue her 02 to desat in the  70s , denies sycnope in relation to this , does report hx of vertigo as well   . Family history of anesthesia complication    twin brother aspirated and died on OR table, per pt.  . Fibromyalgia   . Gallbladder disease   . GERD (gastroesophageal reflux disease)   . H/O blood clots    had blood to  PICC line   . History of gastric ulcer    as a teenager  . History of seizure age 19   as a reaction to Penicillin - no seizures since  . History of stomach ulcers   . History of thyroid  cancer    s/p thyroidectomy  . Hyperlipidemia   . Hypothyroidism   . Joint pain   . Left knee injury   . Liver disorder    seen when she had her hysterectomy , reports she was told it was  " small lesion" ; but asymptomatic   . Lymphedema    left arm  . Migraines   . Multiple food allergies   . Obesity   . OSA (obstructive sleep apnea) 02/16/2016  . Palpitations    reports no longer experiences  . Personal history of chemotherapy 2015  . Personal history of radiation therapy 2015   Left  . Prediabetes   . Rheumatoid arthritis ([place])   . SOB (shortness of breath)   . Swelling of both ankles   . Tinnitus   . UTI (lower urinary tract infection) 02/17/2014  . Vertigo    last episode  was 2 weeks ago   . Vitamin D deficiency   . Wears contact lenses    left eye only   . Wears hearing aid in both ears      Social History  Socioeconomic History  . Marital status: Married    Spouse name: Not on file  . Number of children: Not on file  . Years of education: Not on file  . Highest education level: Not on file  Occupational History  . 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 Smoking status: Never Smoker  . Smokeless tobacco: Never Used  Substance and Sexual Activity  . Alcohol use: No    Alcohol/week: 0.0 standard drinks  . Drug use: No  . Sexual activity: Not Currently    Partners: Male    Comment: menarche age 40, P 2, first birth age 7, menopause age 20, Premarin x 10 yrs  Other Topics Concern  . Not on file  Social History Narrative   Regular exercise: yes      Social Determinants of Health   Financial Resource Strain:   . Difficulty of Paying Living Expenses:   Food Insecurity:   . Worried About Charity fundraiser in the Last Year:   . Arboriculturist in the Last Year:   Transportation Needs:   . Film/video editor (Medical):   Marland Kitchen Lack of Transportation (Non-Medical):   Physical Activity:   . Days of Exercise per Week:   .  Minutes of Exercise per Session:   Stress:   . Feeling of Stress :   Social Connections:   . Frequency of Communication with Friends and Family:   . Frequency of Social Gatherings with Friends and Family:   . Attends Religious Services:   . Active Member of Clubs or Organizations:   . Attends Archivist Meetings:   Marland Kitchen Marital Status:   Intimate Partner Violence:   . Fear of Current or Ex-Partner:   . Emotionally Abused:   Marland Kitchen Physically Abused:   . Sexually Abused:     Past Surgical History:  Procedure Laterality Date  . ABDOMINAL HYSTERECTOMY  2004   complete  . ACHILLES TENDON REPAIR Right   . APPENDECTOMY  2004  . BREAST EXCISIONAL BIOPSY    . BREAST LUMPECTOMY Left    2015  . BREAST LUMPECTOMY WITH NEEDLE LOCALIZATION AND AXILLARY SENTINEL LYMPH NODE BX Left 02/23/2014   Procedure: BREAST LUMPECTOMY WITH NEEDLE LOCALIZATION AND AXILLARY SENTINEL LYMPH NODE BIOPSY;  Surgeon: Excell Seltzer, MD;  Location: [facility];  Service: General;  Laterality: Left;  . BREAST SURGERY  2011   left breast biposy  . CHOLECYSTECTOMY  1990  . COLONOSCOPY W/ POLYPECTOMY  06/2009  . EYE SURGERY Right 2013   exc. warts from underneath eyelid  . INCONTINENCE SURGERY  2004  . KNEE ARTHROSCOPY Bilateral    x 6 each knee  . LIGAMENT REPAIR Right    thumb/wrist  . ORIF TOE FRACTURE Right    great toe  . PORTACATH PLACEMENT N/A 03/24/2014   Procedure: INSERTION PORT-A-CATH;  Surgeon: Excell Seltzer, MD;  Location: [facility];  Service: General;  Laterality: N/A;; removed   . THYROIDECTOMY  2000  . TONSILLECTOMY AND ADENOIDECTOMY  2000  . TOTAL KNEE ARTHROPLASTY Left 12/03/2017   Procedure: LEFT TOTAL KNEE ARTHROPLASTY;  Surgeon: Gaynelle Arabian, MD;  Location: [facility];  Service: Orthopedics;  Laterality: Left;  . TUMOR EXCISION     from thoracic spine    Family History  Problem Relation Age of Onset  . Alcohol abuse Mother   . Arthritis Mother   .  Hypertension Mother   . Bipolar disorder  Mother   . Breast cancer Mother 49       unconfirmed  . Lung cancer Mother 46       smoker  . Hyperlipidemia Mother   . Stroke Mother   . Cancer Mother   . Depression Mother   . Anxiety disorder Mother   . Alcoholism Mother   . Drug abuse Mother   . Eating disorder Mother   . Obesity Mother   . Alcohol abuse Father   . Cancer Father        lung  . Hyperlipidemia Father   . Kidney disease Father   . Diabetes Father   . Hypertension Father   . Cystic kidney disease Father   . Thyroid disease Father   . Liver disease Father   . Alcoholism Father   . Arthritis Maternal Grandmother   . Diabetes Paternal Grandmother   . Thyroid cancer Sister 67       type?; currently 22  . Other Sister        ovarian tumor @ 60; TAH/BSO  . Breast cancer Sister   . Thyroid cancer Brother        dx 29s; currently 33  . Breast cancer Maternal Aunt        dx 43s; deceased 64  . Thyroid cancer Paternal Aunt        All 3 paternal aunts with thyroid ca in 30s/40s  . Lung cancer Paternal Aunt        2 of 3 paternal aunts with lung cancer    Allergies  Allergen Reactions  . Bee Venom Anaphylaxis  . Contrast Media [Iodinated Diagnostic Agents] Shortness Of Breath  . Iodine Other (See Comments)    CARDIAC ARREST  . Latex Anaphylaxis and Rash  . Penicillins Shortness Of Breath, Rash and Other (See Comments)    SEIZURE Did it involve swelling of the face/tongue/throat, SOB, or low BP? no Did it involve sudden or severe rash/hives, skin peeling, or any reaction on the inside of your mouth or nose? yes Did you need to seek medical attention at a hospital or doctor's office? yes When did it last happen?2010 If all above answers are "NO", may proceed with cephalosporin use.   . Shellfish Allergy Shortness Of Breath and Rash  . Aspirin Rash and Other (See Comments)    GI UPSET  . Erythromycin Swelling and Rash    SWELLING OF JOINTS  . Lidocaine  Swelling    SWELLING OF MOUTH AND THROAT  . Symbicort [Budesonide-Formoterol Fumarate] Other (See Comments)    BURNING OF TONGUE AND LIPS  . Betadine [Povidone Iodine]     Rash. Breathing problems.   . Codeine Rash  . Compazine [Prochlorperazine Maleate] Rash    Rash on face,chest, arms, back  . Pentazocine Lactate Rash  . Sulfonamide Derivatives Rash    Current Outpatient Medications on File Prior to Visit  Medication Sig Dispense Refill  . albuterol (PROVENTIL) (5 MG/ML) 0.5% nebulizer solution Take 0.5 mLs (2.5 mg total) by nebulization every 6 (six) hours as needed for wheezing or shortness of breath. 40 mL 3  . albuterol (VENTOLIN HFA) 108 (90 Base) MCG/ACT inhaler Inhale 2 puffs into the lungs every 6 (six) hours as needed for wheezing or shortness of breath.    . ALPHAGAN P 0.1 % SOLN Place 1 drop into both eyes in the morning and at bedtime.     Marland Kitchen anastrozole (ARIMIDEX) 1 MG tablet Take 1 tablet (1 mg total) by  mouth daily. 90 tablet 3  . atorvastatin (LIPITOR) 40 MG tablet Take 1 tablet (40 mg total) by mouth daily at 6 PM. 90 tablet 0  . Blood Pressure KIT Check blood sugar as directed 1 kit 0  . CALCIUM PO Take 1 tablet by mouth every evening.    . cholecalciferol (VITAMIN D3) 25 MCG (1000 UNIT) tablet Take 1,000 Units by mouth every evening.     . clopidogrel (PLAVIX) 75 MG tablet Take 1 tablet (75 mg total) by mouth daily. (Patient taking differently: Take 75 mg by mouth every evening. ) 30 tablet 1  . EPINEPHrine 0.3 mg/0.3 mL IJ SOAJ injection Inject 0.3 mLs (0.3 mg total) into the muscle once. (Patient taking differently: Inject 0.3 mg into the muscle as needed for anaphylaxis. ) 2 Device 0  . fluticasone (FLONASE) 50 MCG/ACT nasal spray Place 2 sprays into both nostrils daily as needed for allergies.    . Fluticasone-Salmeterol (WIXELA INHUB) 250-50 MCG/DOSE AEPB Inhale 1 puff into the lungs 2 (two) times daily. 180 each 3  . furosemide (LASIX) 20 MG tablet Take 1 tablet  (20 mg total) by mouth daily as needed for edema. 30 tablet 3  . levothyroxine (SYNTHROID) 175 MCG tablet Take 1 tablet (175 mcg total) by mouth daily before breakfast. 45 tablet 3  . lisinopril (ZESTRIL) 10 MG tablet Take 1 tablet (10 mg total) by mouth daily. Take 1 tablet by mouth once daily 90 tablet 3  . Multiple Vitamin (MULTIVITAMIN ADULT) TABS Take 1 tablet by mouth daily.    . Multiple Vitamins-Minerals (OCUVITE PO) Take 1 tablet by mouth daily.    Marland Kitchen MYRBETRIQ 50 MG TB24 tablet TAKE 1 TABLET BY MOUTH  DAILY 90 tablet 1  . Omega-3 Fatty Acids (FISH OIL) 1000 MG CAPS Take 1 capsule by mouth every evening.     . SUMAtriptan (IMITREX) 50 MG tablet Take 1 tablet by mouth at start of migraine, may repeat in 2 hours if not improved. Max 2 tabs/24 hrs (Patient taking differently: Take 50 mg by mouth every 2 (two) hours as needed for migraine. Take 1 tablet by mouth at start of migraine, may repeat in 2 hours if not improved. Max 2 tabs/24 hrs) 10 tablet 2  . terbinafine (LAMISIL) 250 MG tablet Take 1 tablet (250 mg total) by mouth daily. 30 tablet 1  . [DISCONTINUED] meloxicam (MOBIC) 7.5 MG tablet TAKE 1 TABLET BY MOUTH  DAILY 90 tablet 1   No current facility-administered medications on file prior to visit.    BP 127/85 (BP Location: Right Arm, Patient Position: Sitting, Cuff Size: Large)   Pulse 60   Temp (!) 97.5 F (36.4 C) (Temporal)   Resp 16   Ht _0  (1.676 m)   Wt 291 lb (132 kg)   SpO2 99%   BMI 46.97 kg/m       Objective:   Physical Exam Constitutional:      Appearance: She is well-developed.  Cardiovascular:     Rate and Rhythm: Normal rate and regular rhythm.     Heart sounds: Normal heart sounds. No murmur.  Pulmonary:     Effort: Pulmonary effort is normal. No respiratory distress.     Breath sounds: Normal breath sounds. No wheezing.  Musculoskeletal:     Right lower leg: 2+ Edema present.     Left lower leg: 2+ Edema present.  Neurological:     Deep Tendon  Reflexes:     Reflex Scores:  Patellar reflexes are 2+ on the right side and 2+ on the left side.    Comments: Bilateral LE strength is 5/5  Psychiatric:        Behavior: Behavior normal.        Thought Content: Thought content normal.        Judgment: Judgment normal.           Assessment & Plan:  Sciatica- new. Will rx with meloxicam and robaxin. Pt is advised to call if symptoms worsen or if symptoms fail to improve.  HTN- bp stable. Continue current meds.   Retinal artery occlusion- clinically stable. Continue plavix and statin for secondary prevention. Will also refer to neurology.    This visit occurred during the SARS-CoV-2 public health emergency.  Safety protocols were in place, including screening questions prior to the visit, additional usage of staff PPE, and extensive cleaning of exam room while observing appropriate contact time as indicated for disinfecting solutions.

## 2019-10-21 ENCOUNTER — Encounter: Payer: Self-pay | Admitting: Family

## 2019-10-25 ENCOUNTER — Encounter: Payer: Self-pay | Admitting: Family

## 2019-10-30 ENCOUNTER — Encounter: Payer: Self-pay | Admitting: Family

## 2019-10-31 ENCOUNTER — Other Ambulatory Visit: Payer: Self-pay

## 2019-10-31 ENCOUNTER — Ambulatory Visit: Payer: 59 | Admitting: Family

## 2019-10-31 ENCOUNTER — Ambulatory Visit (INDEPENDENT_AMBULATORY_CARE_PROVIDER_SITE_OTHER): Payer: 59 | Admitting: Family

## 2019-10-31 ENCOUNTER — Encounter: Payer: Self-pay | Admitting: Family

## 2019-10-31 VITALS — BP 139/90 | HR 67 | Temp 97.6°F | Resp 16 | Wt 292.0 lb

## 2019-10-31 DIAGNOSIS — M5431 Sciatica, right side: Secondary | ICD-10-CM | POA: Diagnosis not present

## 2019-10-31 DIAGNOSIS — H349 Unspecified retinal vascular occlusion: Secondary | ICD-10-CM | POA: Diagnosis not present

## 2019-10-31 MED ORDER — LISINOPRIL 20 MG PO TABS: 20.0000 mg | ORAL_TABLET | Freq: Every day | ORAL | 1 refills | Status: DC

## 2019-10-31 NOTE — Progress Notes (Signed)
Subjective:    Patient ID: Michelle Kennedy, female    DOB: 1955-11-12, 64 y.o.   MRN: [id number]  HPI  Patient is a 64 year old female who presents today for follow-up of her low back pain.  We last saw her on 10/17/2019 with acute midline low back pain and associated right-sided sciatica.  We treated her with meloxicam and as needed Robaxin. She reports that her back pain is not completely resolved but it is significantly improved.  Hx of Retinal Artery Occlusion- Has neurology appointment in August. Still has "stacking" of her vision.   Review of Systems    see HPI  Past Medical History:  Diagnosis Date  . Abnormally small mouth   . Achilles tendon disorder, right   . Anemia   . Anxiety   . Arthritis    hips and knees  . Asthma    daily inhaler, prn inhaler and neb.  . Asthma due to environmental allergies   . Breast cancer ([place]) 01/2014   left  . Cancer ([place])   . Chest pain   . COPD (chronic obstructive pulmonary disease) ([place])   . Dental bridge present    upper front and lower right  . Dental crowns present    x 3  . Depression   . Esophageal spasm    reports since her chemo and breast surgery  she developed esophageal spasms and reports this is in the past has casue her 02 to desat in the  70s , denies sycnope in relation to this , does report hx of vertigo as well   . Family history of anesthesia complication    twin brother aspirated and died on OR table, per pt.  . Fibromyalgia   . Gallbladder disease   . GERD (gastroesophageal reflux disease)   . H/O blood clots    had blood to  PICC line   . History of gastric ulcer    as a teenager  . History of seizure age 18   as a reaction to Penicillin - no seizures since  . History of stomach ulcers   . History of thyroid cancer    s/p thyroidectomy  . Hyperlipidemia   . Hypothyroidism   . Joint pain   . Left knee injury   . Liver disorder    seen when she had her hysterectomy , reports she was told it was  "  small lesion" ; but asymptomatic   . Lymphedema    left arm  . Migraines   . Multiple food allergies   . Obesity   . OSA (obstructive sleep apnea) 02/16/2016  . Palpitations    reports no longer experiences  . Personal history of chemotherapy 2015  . Personal history of radiation therapy 2015   Left  . Prediabetes   . Rheumatoid arthritis ([place])   . SOB (shortness of breath)   . Swelling of both ankles   . Tinnitus   . UTI (lower urinary tract infection) 02/17/2014  . Vertigo    last episode  was 2 weeks ago   . Vitamin D deficiency   . Wears contact lenses    left eye only   . Wears hearing aid in both ears      Social History   Socioeconomic History  . Marital status: Married    Spouse name: Not on file  . Number of children: Not on file  . Years of education: Not on file  . Highest education level: Not on  file  Occupational History  . 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 Smoking status: Never Smoker  . Smokeless tobacco: Never Used  Vaping Use  . Vaping Use: Never used  Substance and Sexual Activity  . Alcohol use: No    Alcohol/week: 0.0 standard drinks  . Drug use: No  . Sexual activity: Not Currently    Partners: Male    Comment: menarche age 37, P 2, first birth age 42, menopause age 73, Premarin x 10 yrs  Other Topics Concern  . Not on file  Social History Narrative   Regular exercise: yes      Social Determinants of Health   Financial Resource Strain:   . Difficulty of Paying Living Expenses:   Food Insecurity:   . Worried About Charity fundraiser in the Last Year:   . Arboriculturist in the Last Year:   Transportation Needs:   . Film/video editor (Medical):   Marland Kitchen Lack of Transportation (Non-Medical):   Physical Activity:   . Days of Exercise per Week:   . Minutes of Exercise per Session:   Stress:   . Feeling of Stress :   Social Connections:   . Frequency of Communication with Friends and Family:   .  Frequency of Social Gatherings with Friends and Family:   . Attends Religious Services:   . Active Member of Clubs or Organizations:   . Attends Archivist Meetings:   Marland Kitchen Marital Status:   Intimate Partner Violence:   . Fear of Current or Ex-Partner:   . Emotionally Abused:   Marland Kitchen Physically Abused:   . Sexually Abused:     Past Surgical History:  Procedure Laterality Date  . ABDOMINAL HYSTERECTOMY  2004   complete  . ACHILLES TENDON REPAIR Right   . APPENDECTOMY  2004  . BREAST EXCISIONAL BIOPSY    . BREAST LUMPECTOMY Left    2015  . BREAST LUMPECTOMY WITH NEEDLE LOCALIZATION AND AXILLARY SENTINEL LYMPH NODE BX Left 02/23/2014   Procedure: BREAST LUMPECTOMY WITH NEEDLE LOCALIZATION AND AXILLARY SENTINEL LYMPH NODE BIOPSY;  Surgeon: Excell Seltzer, MD;  Location: [facility];  Service: General;  Laterality: Left;  . BREAST SURGERY  2011   left breast biposy  . CHOLECYSTECTOMY  1990  . COLONOSCOPY W/ POLYPECTOMY  06/2009  . EYE SURGERY Right 2013   exc. warts from underneath eyelid  . INCONTINENCE SURGERY  2004  . KNEE ARTHROSCOPY Bilateral    x 6 each knee  . LIGAMENT REPAIR Right    thumb/wrist  . ORIF TOE FRACTURE Right    great toe  . PORTACATH PLACEMENT N/A 03/24/2014   Procedure: INSERTION PORT-A-CATH;  Surgeon: Excell Seltzer, MD;  Location: [facility];  Service: General;  Laterality: N/A;; removed   . THYROIDECTOMY  2000  . TONSILLECTOMY AND ADENOIDECTOMY  2000  . TOTAL KNEE ARTHROPLASTY Left 12/03/2017   Procedure: LEFT TOTAL KNEE ARTHROPLASTY;  Surgeon: Gaynelle Arabian, MD;  Location: [facility];  Service: Orthopedics;  Laterality: Left;  . TUMOR EXCISION     from thoracic spine    Family History  Problem Relation Age of Onset  . Alcohol abuse Mother   . Arthritis Mother   . Hypertension Mother   . Bipolar disorder Mother   . Breast cancer Mother 32       unconfirmed  . Lung cancer Mother 91       smoker  .  Hyperlipidemia Mother   . Stroke Mother   . Cancer Mother   . Depression Mother   . Anxiety disorder Mother   . Alcoholism Mother   . Drug abuse Mother   . Eating disorder Mother   . Obesity Mother   . Alcohol abuse Father   . Cancer Father        lung  . Hyperlipidemia Father   . Kidney disease Father   . Diabetes Father   . Hypertension Father   . Cystic kidney disease Father   . Thyroid disease Father   . Liver disease Father   . Alcoholism Father   . Arthritis Maternal Grandmother   . Diabetes Paternal Grandmother   . Thyroid cancer Sister 9       type?; currently 20  . Other Sister        ovarian tumor @ 40; TAH/BSO  . Breast cancer Sister   . Thyroid cancer Brother        dx 13s; currently 70  . Breast cancer Maternal Aunt        dx 19s; deceased 79  . Thyroid cancer Paternal Aunt        All 3 paternal aunts with thyroid ca in 30s/40s  . Lung cancer Paternal Aunt        2 of 3 paternal aunts with lung cancer    Allergies  Allergen Reactions  . Bee Venom Anaphylaxis  . Contrast Media [Iodinated Diagnostic Agents] Shortness Of Breath  . Iodine Other (See Comments)    CARDIAC ARREST  . Latex Anaphylaxis and Rash  . Penicillins Shortness Of Breath, Rash and Other (See Comments)    SEIZURE Did it involve swelling of the face/tongue/throat, SOB, or low BP? no Did it involve sudden or severe rash/hives, skin peeling, or any reaction on the inside of your mouth or nose? yes Did you need to seek medical attention at a hospital or doctor's office? yes When did it last happen?2010 If all above answers are "NO", may proceed with cephalosporin use.   . Shellfish Allergy Shortness Of Breath and Rash  . Aspirin Rash and Other (See Comments)    GI UPSET  . Erythromycin Swelling and Rash    SWELLING OF JOINTS  . Lidocaine Swelling    SWELLING OF MOUTH AND THROAT  . Symbicort [Budesonide-Formoterol Fumarate] Other (See Comments)    BURNING OF TONGUE AND LIPS  .  Betadine [Povidone Iodine]     Rash. Breathing problems.   . Codeine Rash  . Compazine [Prochlorperazine Maleate] Rash    Rash on face,chest, arms, back  . Pentazocine Lactate Rash  . Sulfonamide Derivatives Rash    Current Outpatient Medications on File Prior to Visit  Medication Sig Dispense Refill  . albuterol (PROVENTIL) (5 MG/ML) 0.5% nebulizer solution Take 0.5 mLs (2.5 mg total) by nebulization every 6 (six) hours as needed for wheezing or shortness of breath. 40 mL 3  . albuterol (VENTOLIN HFA) 108 (90 Base) MCG/ACT inhaler Inhale 2 puffs into the lungs every 6 (six) hours as needed for wheezing or shortness of breath.    . ALPHAGAN P 0.1 % SOLN Place 1 drop into both eyes in the morning and at bedtime.     Marland Kitchen anastrozole (ARIMIDEX) 1 MG tablet Take 1 tablet (1 mg total) by mouth daily. 90 tablet 3  . atorvastatin (LIPITOR) 40 MG tablet Take 1 tablet (40 mg total) by mouth daily at 6 PM. 90 tablet 0  . Blood  Pressure KIT Check blood sugar as directed 1 kit 0  . CALCIUM PO Take 1 tablet by mouth every evening.    . cholecalciferol (VITAMIN D3) 25 MCG (1000 UNIT) tablet Take 1,000 Units by mouth every evening.     . clopidogrel (PLAVIX) 75 MG tablet Take 1 tablet (75 mg total) by mouth daily. (Patient taking differently: Take 75 mg by mouth every evening. ) 30 tablet 1  . EPINEPHrine 0.3 mg/0.3 mL IJ SOAJ injection Inject 0.3 mLs (0.3 mg total) into the muscle once. (Patient taking differently: Inject 0.3 mg into the muscle as needed for anaphylaxis. ) 2 Device 0  . fluticasone (FLONASE) 50 MCG/ACT nasal spray Place 2 sprays into both nostrils daily as needed for allergies.    . Fluticasone-Salmeterol (WIXELA INHUB) 250-50 MCG/DOSE AEPB Inhale 1 puff into the lungs 2 (two) times daily. 180 each 3  . furosemide (LASIX) 20 MG tablet Take 1 tablet (20 mg total) by mouth daily as needed for edema. 30 tablet 3  . levothyroxine (SYNTHROID) 175 MCG tablet Take 1 tablet (175 mcg total) by mouth  daily before breakfast. 45 tablet 3  . lisinopril (ZESTRIL) 10 MG tablet Take 1 tablet (10 mg total) by mouth daily. Take 1 tablet by mouth once daily 90 tablet 3  . meloxicam (MOBIC) 7.5 MG tablet Take 1 tablet (7.5 mg total) by mouth daily. 14 tablet 0  . methocarbamol (ROBAXIN) 500 MG tablet Take 1 tablet (500 mg total) by mouth every 8 (eight) hours as needed for muscle spasms. 30 tablet 0  . Multiple Vitamin (MULTIVITAMIN ADULT) TABS Take 1 tablet by mouth daily.    . Multiple Vitamins-Minerals (OCUVITE PO) Take 1 tablet by mouth daily.    Marland Kitchen MYRBETRIQ 50 MG TB24 tablet TAKE 1 TABLET BY MOUTH  DAILY 90 tablet 1  . Omega-3 Fatty Acids (FISH OIL) 1000 MG CAPS Take 1 capsule by mouth every evening.     . SUMAtriptan (IMITREX) 50 MG tablet Take 1 tablet by mouth at start of migraine, may repeat in 2 hours if not improved. Max 2 tabs/24 hrs (Patient taking differently: Take 50 mg by mouth every 2 (two) hours as needed for migraine. Take 1 tablet by mouth at start of migraine, may repeat in 2 hours if not improved. Max 2 tabs/24 hrs) 10 tablet 2  . terbinafine (LAMISIL) 250 MG tablet Take 1 tablet (250 mg total) by mouth daily. 30 tablet 1  . [DISCONTINUED] meloxicam (MOBIC) 7.5 MG tablet TAKE 1 TABLET BY MOUTH  DAILY 90 tablet 1   No current facility-administered medications on file prior to visit.    BP 139/90 (BP Location: Right Arm, Patient Position: Sitting, Cuff Size: Large)   Pulse 67   Temp 97.6 F (36.4 C) (Temporal)   Resp 16   Wt 292 lb (132.5 kg)   SpO2 99%   BMI 47.13 kg/m    Objective:   Physical Exam Constitutional:      Appearance: She is well-developed.  Cardiovascular:     Rate and Rhythm: Normal rate and regular rhythm.     Heart sounds: Normal heart sounds. No murmur heard.   Pulmonary:     Effort: Pulmonary effort is normal. No respiratory distress.     Breath sounds: Normal breath sounds. No wheezing.  Psychiatric:        Behavior: Behavior normal.         Thought Content: Thought content normal.        Judgment:  Judgment normal.           Assessment & Plan:  Sciatica-improved.  Continue meloxicam and Robaxin as needed.  Retinal artery occlusion-patient encouraged to keep her upcoming appointment with neurology and also to schedule follow-up appointment with ophthalmology.  This visit occurred during the SARS-CoV-2 public health emergency.  Safety protocols were in place, including screening questions prior to the visit, additional usage of staff PPE, and extensive cleaning of exam room while observing appropriate contact time as indicated for disinfecting solutions.

## 2019-11-07 ENCOUNTER — Telehealth: Payer: Self-pay | Admitting: Family

## 2019-11-07 ENCOUNTER — Other Ambulatory Visit: Payer: Self-pay

## 2019-11-07 MED ORDER — ALBUTEROL SULFATE HFA 108 (90 BASE) MCG/ACT IN AERS: 2.0000 | INHALATION_SPRAY | Freq: Four times a day (QID) | RESPIRATORY_TRACT | 5 refills | Status: DC | PRN

## 2019-11-07 NOTE — Telephone Encounter (Signed)
Medication: albuterol (VENTOLIN HFA) 108 (90 Base) MCG/ACT inhaler   Has the patient contacted their pharmacy? Yes.   (If no, request that the patient contact the pharmacy for the refill.) (If yes, when and what did the pharmacy advise?)  Preferred Pharmacy (with phone number or street name):[id number] - [place], [place] - [address] AT [address]  [address] [place], [place]  [address]  Phone:  [phone] Fax:  [phone]   Agent: Please be advised that RX refills may take up to 3 business days. We ask that you follow-up with your pharmacy.

## 2019-11-07 NOTE — Telephone Encounter (Signed)
rx Was sent

## 2019-11-14 ENCOUNTER — Ambulatory Visit: Payer: 59 | Admitting: Family

## 2019-11-16 ENCOUNTER — Other Ambulatory Visit: Payer: Self-pay | Admitting: Family

## 2019-11-21 ENCOUNTER — Encounter: Payer: Self-pay | Admitting: Family

## 2019-11-21 MED ORDER — FLUTICASONE-SALMETEROL 250-50 MCG/DOSE IN AEPB: 1.0000 | INHALATION_SPRAY | Freq: Two times a day (BID) | RESPIRATORY_TRACT | 3 refills | Status: DC

## 2019-11-23 ENCOUNTER — Other Ambulatory Visit: Payer: Self-pay | Admitting: Family

## 2019-11-25 NOTE — Telephone Encounter (Signed)
Michelle Kennedy, We received a request from the pharmacy to refill her levothyroxine 200.  However, in 06/2019 I changed her to levothyroxine 175.  Can you please check with her whether she can come for labs before we refill the 175?  Labs are in.  If not, we can refill a little to last her until she can come for labs. Ty, C

## 2019-11-26 NOTE — Telephone Encounter (Signed)
M,  see below and let me know

## 2019-11-26 NOTE — Telephone Encounter (Signed)
My Chart message sent

## 2019-11-27 NOTE — Telephone Encounter (Signed)
Left message for patient to return our call at [phone].  

## 2019-12-07 ENCOUNTER — Encounter: Payer: Self-pay | Admitting: Family

## 2019-12-08 MED ORDER — ATORVASTATIN CALCIUM 40 MG PO TABS: 40.0000 mg | ORAL_TABLET | Freq: Every day | ORAL | 1 refills | Status: DC

## 2019-12-09 ENCOUNTER — Other Ambulatory Visit: Payer: Self-pay

## 2019-12-09 ENCOUNTER — Other Ambulatory Visit (INDEPENDENT_AMBULATORY_CARE_PROVIDER_SITE_OTHER): Payer: 59

## 2019-12-09 ENCOUNTER — Encounter: Payer: Self-pay | Admitting: Family

## 2019-12-09 DIAGNOSIS — E890 Postprocedural hypothyroidism: Secondary | ICD-10-CM

## 2019-12-09 DIAGNOSIS — C73 Malignant neoplasm of thyroid gland: Secondary | ICD-10-CM | POA: Diagnosis not present

## 2019-12-09 DIAGNOSIS — M5440 Lumbago with sciatica, unspecified side: Secondary | ICD-10-CM

## 2019-12-09 LAB — T4, FREE: Free T4: 1.13 ng/dL (ref 0.60–1.60)

## 2019-12-09 LAB — TSH: TSH: 0.41 u[IU]/mL (ref 0.35–4.50)

## 2019-12-10 LAB — THYROGLOBULIN LEVEL: Thyroglobulin: 0.1 ng/mL — ABNORMAL LOW

## 2019-12-10 LAB — THYROGLOBULIN ANTIBODY: Thyroglobulin Ab: <1 IU/mL (ref <=1)

## 2019-12-13 ENCOUNTER — Other Ambulatory Visit: Payer: Self-pay | Admitting: Family

## 2019-12-14 ENCOUNTER — Other Ambulatory Visit: Payer: Self-pay | Admitting: Family

## 2019-12-17 ENCOUNTER — Encounter: Payer: Self-pay | Admitting: Family Medicine

## 2019-12-17 ENCOUNTER — Ambulatory Visit: Payer: Self-pay

## 2019-12-17 ENCOUNTER — Other Ambulatory Visit: Payer: Self-pay | Admitting: Family

## 2019-12-17 ENCOUNTER — Ambulatory Visit (INDEPENDENT_AMBULATORY_CARE_PROVIDER_SITE_OTHER): Payer: 59 | Admitting: Family Medicine

## 2019-12-17 ENCOUNTER — Other Ambulatory Visit: Payer: Self-pay

## 2019-12-17 DIAGNOSIS — M5441 Lumbago with sciatica, right side: Secondary | ICD-10-CM

## 2019-12-17 MED ORDER — METHOCARBAMOL 500 MG PO TABS: 500.0000 mg | ORAL_TABLET | Freq: Three times a day (TID) | ORAL | 1 refills | Status: DC | PRN

## 2019-12-17 MED ORDER — TRAMADOL HCL 50 MG PO TABS: 50.0000 mg | ORAL_TABLET | Freq: Four times a day (QID) | ORAL | 0 refills | Status: DC | PRN

## 2019-12-17 NOTE — Progress Notes (Signed)
I saw and examined the patient with Dr. Elouise Munroe and agree with assessment and plan as outlined.    Pain since travelling to .  Tender at right SI joint.  Neuro exam nonfocal.  Discussed options and elected to inject the SI joint.  Has lidocaine and betadine allergy, so avoided those.  Will refer to chiropractic if fails to improve.

## 2019-12-17 NOTE — Progress Notes (Signed)
Office Visit Note   Patient: Michelle Kennedy           Date of Birth: 01-10-1956           MRN: [id number] Visit Date: 12/17/2019 Requested by: Debbrah Alar, NP [address] [place],  [place] [address] PCP: Debbrah Alar, NP  Subjective: Chief Complaint  Patient presents with  . Lower Back - Pain    Pain mostly on right side, but will move to left side if she sits a long time. Started after sitting on a over 4-hour flight & 1 hour car ride on  09/26/19. Radiates down posterior leg to knee with standing much.    HPI: 64yo F presenting to clinic for subacute right lower back pain x3 months. Patient states she was traveling to [place] to see her new grandbaby, and she handled the flight well, however the next day she was in 'so much pain I couldn't move!.' She states that her husband and son went out to get her Advil ('Which I've been taking every four hours since then') as well as several heating packs. Her husband previously did physical therapy for his back, and had coached her on how to do the home exercises that he had been prescribed. She has been trying to get into the pool to do gentle mobilization, which she states feels good when she's in the water, but the pain immediately returns when she returns to dry land. She saw her PCM and was given muscle relaxant medications, which she states started to calm it down, however her pain returned 'just as bad' when she ran out of this medication.  She describes that her pain radiates down to the back of her right knee, but feels like a deep ache and is not electric in nature. No numbness/tingling in her foot.               ROS:   All other systems were reviewed and are negative. Does take plavix  Objective: Vital Signs: There were no vitals taken for this visit.  Physical Exam:  General:  Alert and oriented, in no acute distress. Pulm:  Breathing unlabored. Psy:  Normal mood, congruent affect. Skin:  Mild  bruising over R SI Joint. Skin otherwise intact.   Walks with normal gait. Normal spinal curvature.  Palpation: Significant tenderness to palpation over right SI Joint. Some tenderness with palpation of R Lower lumbar paraspinal muscles, however less so than with the SI Joint. No midline tenderness. No step-offs or deformities.  5/5 strength with hip flexion/extension, knee flexion/extension, and foot plantar/dorsiflexion.  Sensation intact in distal bilateral LE.  Brisk distal capillary refill.  SLR negative.  FADIR causes lower back pain on R.    Imaging: US Guided Needle Placement  Result Date: 12/17/2019 Ultrasound-guided right SI injection: After sterile prep with alcohol, injected 40 mg methylprednisolone using a 22-gauge spinal needle, passing the needle through the sacroiliac ligament into the region of the SI joint.      XR Lumbar Spine 2-3 Views  Result Date: 12/17/2019 X-Rays show mild-moderate L2-3 DDD.  There is moderate L4-5 and L5-S1 facet DJD.  SI joints have mild degenerative change.  Hip joints are unremarkable.  No sign of compression fracture or neoplasm.   Assessment & Plan: 64 yo F presenting to clinic with subacute lower right back pain, with examination as above- pain focused over the SI Joint on the right. Patient states that this is the primary source  of her pain, and has not improved with diligent home remedies. Given her significant discomfort, discussed steroid shot today, which patient was very interested in. Procedure performed as described above. Patient may also benefit from chiropractic care, or physical therapy, pending improvement from aforementioned shot. She was encouraged to reach back out if she continues to be in back within 1 week. Patient was agreeable with plan, with no further questions or concerns. Risks and benefits of steroid shot discussed, patient tolerated well with no immediate complications.      Procedures: No procedures performed  No  notes on file     PMFS History: Patient Active Problem List   Diagnosis Date Noted  . Noncompliance with medications 08/23/2019  . Chronic diastolic CHF (congestive heart failure) ([place]) 08/23/2019  . Retinal artery occlusion 08/22/2019  . OA (osteoarthritis) of knee 12/03/2017  . Class 3 obesity without serious comorbidity with body mass index (BMI) of 40.0 to 44.9 in adult 01/01/2017  . Essential hypertension 10/18/2016  . Constipation 10/04/2016  . Vitamin D deficiency 10/04/2016  . Prediabetes 10/04/2016  . Obesity, Class III, BMI 40-49.9 (morbid obesity) ([place]) 09/20/2016  . Other fatigue 09/20/2016  . OSA (obstructive sleep apnea) 02/16/2016  . Right ankle pain 01/20/2016  . Strain of right Achilles tendon 11/30/2015  . Benign hypertension 10/11/2015  . Malignant neoplasm of thyroid gland ([place]) 10/11/2015  . De Quervain's tenosynovitis, bilateral 09/15/2015  . Right shoulder pain 09/15/2015  . Trigger finger, acquired 06/22/2015  . Left wrist pain 06/22/2015  . Thumb injury 06/14/2015  . Chemotherapy-induced peripheral neuropathy ([place]) 05/10/2015  . Postmenopausal osteoporosis 12/08/2014  . Preventative health care 12/01/2014  . DVT (deep venous thrombosis) ([place]) 08/28/2014  . Lymphedema of arm 08/28/2014  . Mucositis due to chemotherapy 06/02/2014  . UTI symptoms 02/17/2014  . Menopausal syndrome (hot flashes) 12/31/2013  . Breast cancer of upper-outer quadrant of left female breast ([place]) 12/26/2013  . 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 Gingivitis 12/05/2013  . Seasonal allergies 09/24/2013  . Skin lesion 06/03/2013  . Menopause 06/02/2013  . Elevated blood-pressure reading without diagnosis of hypertension 03/05/2013  . Cough 07/24/2012  . Left knee injury 11/02/2011  . Gout 09/19/2011  . Hyperlipidemia 06/02/2011  . Migraine 06/02/2011  . Tinnitus of both ears 02/14/2011  . Left knee pain 09/23/2010  . FIBROMYALGIA 05/10/2010  .  Asthma 03/22/2010  . 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 OVERWEIGHT 12/20/2009  . GERD 12/20/2009  . Depression 12/01/2009   Past Medical History:  Diagnosis Date  . Abnormally small mouth   . Achilles tendon disorder, right   . Anemia   . Anxiety   . Arthritis    hips and knees  . Asthma    daily inhaler, prn inhaler and neb.  . Asthma due to environmental allergies   . Breast cancer ([place]) 01/2014   left  . Cancer ([place])   . Chest pain   . COPD (chronic obstructive pulmonary disease) ([place])   . Dental bridge present    upper front and lower right  . Dental crowns present    x 3  . Depression   . Esophageal spasm    reports since her chemo and breast surgery  she developed esophageal spasms and reports this is in the past has casue her 02 to desat in the  70s , denies sycnope in relation to this , does report hx of vertigo as well   . Family history of  anesthesia complication    twin brother aspirated and died on OR table, per pt.  . Fibromyalgia   . Gallbladder disease   . GERD (gastroesophageal reflux disease)   . H/O blood clots    had blood to  PICC line   . History of gastric ulcer    as a teenager  . History of seizure age 74   as a reaction to Penicillin - no seizures since  . History of stomach ulcers   . History of thyroid cancer    s/p thyroidectomy  . Hyperlipidemia   . Hypothyroidism   . Joint pain   . Left knee injury   . Liver disorder    seen when she had her hysterectomy , reports she was told it was  " small lesion" ; but asymptomatic   . Lymphedema    left arm  . Migraines   . Multiple food allergies   . Obesity   . OSA (obstructive sleep apnea) 02/16/2016  . Palpitations    reports no longer experiences  . Personal history of chemotherapy 2015  . Personal history of radiation therapy 2015   Left  . Prediabetes   . Rheumatoid arthritis ([place])   . SOB (shortness of breath)   . Swelling of both ankles   . Tinnitus   . UTI (lower  urinary tract infection) 02/17/2014  . Vertigo    last episode  was 2 weeks ago   . Vitamin D deficiency   . Wears contact lenses    left eye only   . Wears hearing aid in both ears     Family History  Problem Relation Age of Onset  . Alcohol abuse Mother   . Arthritis Mother   . Hypertension Mother   . Bipolar disorder Mother   . Breast cancer Mother 17       unconfirmed  . Lung cancer Mother 5       smoker  . Hyperlipidemia Mother   . Stroke Mother   . Cancer Mother   . Depression Mother   . Anxiety disorder Mother   . Alcoholism Mother   . Drug abuse Mother   . Eating disorder Mother   . Obesity Mother   . Alcohol abuse Father   . Cancer Father        lung  . Hyperlipidemia Father   . Kidney disease Father   . Diabetes Father   . Hypertension Father   . Cystic kidney disease Father   . Thyroid disease Father   . Liver disease Father   . Alcoholism Father   . Arthritis Maternal Grandmother   . Diabetes Paternal Grandmother   . Thyroid cancer Sister 65       type?; currently 100  . Other Sister        ovarian tumor @ 76; TAH/BSO  . Breast cancer Sister   . Thyroid cancer Brother        dx 67s; currently 75  . Breast cancer Maternal Aunt        dx 84s; deceased 71  . Thyroid cancer Paternal Aunt        All 3 paternal aunts with thyroid ca in 30s/40s  . Lung cancer Paternal Aunt        2 of 3 paternal aunts with lung cancer    Past Surgical History:  Procedure Laterality Date  . ABDOMINAL HYSTERECTOMY  2004   complete  . ACHILLES TENDON REPAIR Right   . APPENDECTOMY  2004  . BREAST EXCISIONAL BIOPSY    . BREAST LUMPECTOMY Left    2015  . BREAST LUMPECTOMY WITH NEEDLE LOCALIZATION AND AXILLARY SENTINEL LYMPH NODE BX Left 02/23/2014   Procedure: BREAST LUMPECTOMY WITH NEEDLE LOCALIZATION AND AXILLARY SENTINEL LYMPH NODE BIOPSY;  Surgeon: Excell Seltzer, MD;  Location: [facility];  Service: General;  Laterality: Left;  . BREAST SURGERY   2011   left breast biposy  . CHOLECYSTECTOMY  1990  . COLONOSCOPY W/ POLYPECTOMY  06/2009  . EYE SURGERY Right 2013   exc. warts from underneath eyelid  . INCONTINENCE SURGERY  2004  . KNEE ARTHROSCOPY Bilateral    x 6 each knee  . LIGAMENT REPAIR Right    thumb/wrist  . ORIF TOE FRACTURE Right    great toe  . PORTACATH PLACEMENT N/A 03/24/2014   Procedure: INSERTION PORT-A-CATH;  Surgeon: Excell Seltzer, MD;  Location: [facility];  Service: General;  Laterality: N/A;; removed   . THYROIDECTOMY  2000  . TONSILLECTOMY AND ADENOIDECTOMY  2000  . TOTAL KNEE ARTHROPLASTY Left 12/03/2017   Procedure: LEFT TOTAL KNEE ARTHROPLASTY;  Surgeon: Gaynelle Arabian, MD;  Location: [facility];  Service: Orthopedics;  Laterality: Left;  . TUMOR EXCISION     from thoracic spine   Social History   Occupational History  . 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 Smoking status: Never Smoker  . Smokeless tobacco: Never Used  Vaping Use  . Vaping Use: Never used  Substance and Sexual Activity  . Alcohol use: No    Alcohol/week: 0.0 standard drinks  . Drug use: No  . Sexual activity: Not Currently    Partners: Male    Comment: menarche age 41, P 2, first birth age 51, menopause age 34, [place] x 10 yrs

## 2019-12-18 ENCOUNTER — Other Ambulatory Visit: Payer: Self-pay | Admitting: Family

## 2019-12-24 ENCOUNTER — Telehealth: Payer: Self-pay | Admitting: Hematology and Oncology

## 2019-12-24 ENCOUNTER — Telehealth: Payer: Self-pay | Admitting: Medical Oncology

## 2019-12-24 DIAGNOSIS — C50412 Malignant neoplasm of upper-outer quadrant of left female breast: Secondary | ICD-10-CM

## 2019-12-24 NOTE — Telephone Encounter (Signed)
Scheduled appt per 8/4 sch msg - pt is aware of apt

## 2019-12-24 NOTE — Telephone Encounter (Signed)
[name] Study: phone call to patient to schedule her for study visit 41-month follow-up.  I spoke with patient and inquired as to whether I can schedule her to see Dr. Lindi Adie for study follow up toward end of August to first week of September. Patient is scheduled in November to see Dr. Lindi Adie. I explained to patient that study has a time frame of when the 60 month visit should occur and patient gave her verbal understanding to this and gave her consent to change MD visit to a sooner date. I informed patient that this study visit has labs that will be collected as well. Patient provided me with days that she was not available and I informed her that I will make sure that appointment does not occur on one of her unavailable days. Patient denied any questions at this time. I thanked patient for her time and continued support of study.  Scheduling message sent for lab and MD appointment to be scheduled on 01/15/2020. Scheduling to call patient with appt time. Maxwell Marion, RN, BSN, [facility] 12/24/2019 2:01 PM

## 2019-12-30 ENCOUNTER — Telehealth: Payer: Self-pay

## 2019-12-30 ENCOUNTER — Encounter (HOSPITAL_BASED_OUTPATIENT_CLINIC_OR_DEPARTMENT_OTHER): Payer: Self-pay | Admitting: Emergency Medicine

## 2019-12-30 ENCOUNTER — Emergency Department (HOSPITAL_BASED_OUTPATIENT_CLINIC_OR_DEPARTMENT_OTHER)
Admission: EM | Admit: 2019-12-30 | Discharge: 2019-12-30 | Disposition: A | Discharge status: Home / Self Care | Payer: 59 | Attending: Emergency Medicine | Admitting: Emergency Medicine

## 2019-12-30 ENCOUNTER — Emergency Department (HOSPITAL_BASED_OUTPATIENT_CLINIC_OR_DEPARTMENT_OTHER): Payer: 59

## 2019-12-30 ENCOUNTER — Other Ambulatory Visit: Payer: Self-pay

## 2019-12-30 DIAGNOSIS — Z79899 Other long term (current) drug therapy: Secondary | ICD-10-CM | POA: Diagnosis not present

## 2019-12-30 DIAGNOSIS — J4541 Moderate persistent asthma with (acute) exacerbation: Secondary | ICD-10-CM | POA: Insufficient documentation

## 2019-12-30 DIAGNOSIS — J45901 Unspecified asthma with (acute) exacerbation: Secondary | ICD-10-CM

## 2019-12-30 DIAGNOSIS — I5032 Chronic diastolic (congestive) heart failure: Secondary | ICD-10-CM | POA: Insufficient documentation

## 2019-12-30 DIAGNOSIS — J449 Chronic obstructive pulmonary disease, unspecified: Secondary | ICD-10-CM | POA: Diagnosis not present

## 2019-12-30 DIAGNOSIS — E669 Obesity, unspecified: Secondary | ICD-10-CM | POA: Insufficient documentation

## 2019-12-30 DIAGNOSIS — Z96652 Presence of left artificial knee joint: Secondary | ICD-10-CM | POA: Insufficient documentation

## 2019-12-30 DIAGNOSIS — Z853 Personal history of malignant neoplasm of breast: Secondary | ICD-10-CM | POA: Diagnosis not present

## 2019-12-30 DIAGNOSIS — Z91040 Latex allergy status: Secondary | ICD-10-CM | POA: Insufficient documentation

## 2019-12-30 DIAGNOSIS — R0602 Shortness of breath: Secondary | ICD-10-CM | POA: Diagnosis present

## 2019-12-30 DIAGNOSIS — E039 Hypothyroidism, unspecified: Secondary | ICD-10-CM | POA: Diagnosis not present

## 2019-12-30 LAB — CBC WITH DIFFERENTIAL/PLATELET
Abs Immature Granulocytes: 0.03 10*3/uL (ref 0.00–0.07)
Basophils Absolute: 0 10*3/uL (ref 0.0–0.1)
Basophils Relative: 0 %
Eosinophils Absolute: 0.1 10*3/uL (ref 0.0–0.5)
Eosinophils Relative: 1 %
HCT: 46.1 % — ABNORMAL HIGH (ref 36.0–46.0)
Hemoglobin: 14.5 g/dL (ref 12.0–15.0)
Immature Granulocytes: 0 %
Lymphocytes Relative: 24 %
Lymphs Abs: 1.8 10*3/uL (ref 0.7–4.0)
MCH: 24.7 pg — ABNORMAL LOW (ref 26.0–34.0)
MCHC: 31.5 g/dL (ref 30.0–36.0)
MCV: 78.4 fL — ABNORMAL LOW (ref 80.0–100.0)
Monocytes Absolute: 0.6 10*3/uL (ref 0.1–1.0)
Monocytes Relative: 8 %
Neutro Abs: 4.8 10*3/uL (ref 1.7–7.7)
Neutrophils Relative %: 67 %
Platelets: 294 10*3/uL (ref 150–400)
RBC: 5.88 MIL/uL — ABNORMAL HIGH (ref 3.87–5.11)
RDW: 16.3 % — ABNORMAL HIGH (ref 11.5–15.5)
WBC: 7.4 10*3/uL (ref 4.0–10.5)
nRBC: 0 % (ref 0.0–0.2)

## 2019-12-30 LAB — BASIC METABOLIC PANEL
Anion gap: 10 (ref 5–15)
BUN: 16 mg/dL (ref 8–23)
CO2: 25 mmol/L (ref 22–32)
Calcium: 9.6 mg/dL (ref 8.9–10.3)
Chloride: 103 mmol/L (ref 98–111)
Creatinine, Ser: 0.88 mg/dL (ref 0.44–1.00)
GFR calc Af Amer: >60 mL/min (ref >60)
GFR calc non Af Amer: >60 mL/min (ref >60)
Glucose, Bld: 106 mg/dL — ABNORMAL HIGH (ref 70–99)
Potassium: 4.1 mmol/L (ref 3.5–5.1)
Sodium: 138 mmol/L (ref 135–145)

## 2019-12-30 MED ORDER — IPRATROPIUM-ALBUTEROL 0.5-2.5 (3) MG/3ML IN SOLN
RESPIRATORY_TRACT | Status: AC
  Administered 2019-12-30: 3 mL
  Filled 2019-12-30: qty 3

## 2019-12-30 MED ORDER — MAGNESIUM SULFATE 2 GM/50ML IV SOLN
2.0000 g | Freq: Once | INTRAVENOUS | Status: AC
  Administered 2019-12-30: 2 g via INTRAVENOUS
  Filled 2019-12-30: qty 50

## 2019-12-30 MED ORDER — IPRATROPIUM-ALBUTEROL 0.5-2.5 (3) MG/3ML IN SOLN
3.0000 mL | RESPIRATORY_TRACT | Status: AC
  Administered 2019-12-30: 3 mL via RESPIRATORY_TRACT
  Filled 2019-12-30: qty 3

## 2019-12-30 MED ORDER — SODIUM CHLORIDE 0.9 % IV SOLN
INTRAVENOUS | Status: DC | PRN
  Administered 2019-12-30: 250 mL via INTRAVENOUS

## 2019-12-30 MED ORDER — PREDNISONE 10 MG PO TABS
40.0000 mg | ORAL_TABLET | Freq: Every day | ORAL | 0 refills | Status: AC
Start: 2019-12-30 — End: 2020-01-03

## 2019-12-30 MED ORDER — PREDNISONE 50 MG PO TABS
60.0000 mg | ORAL_TABLET | Freq: Once | ORAL | Status: AC
  Administered 2019-12-30: 60 mg via ORAL
  Filled 2019-12-30: qty 1

## 2019-12-30 NOTE — ED Notes (Signed)
PEFR  Pre 210 l/m Post 250 l/m Predicted 388l/m

## 2019-12-30 NOTE — Discharge Instructions (Addendum)
I have prescribed you 4 days of prednisone which you will start taking tomorrow.  Please follow-up with your primary care doctor.  As discussed return immediately to the emergency department for any new or concerning symptoms such as chest pain and worsening shortness of breath, fevers or any other new or concerning symptoms.  Also recommend following up with your pulmonologist although it has been quite sometime since you saw them it would be good to recheck your pulmonary function and be seen by specialist to prevent any further episodes of asthma/COPD exacerbation.  It is also reasonable to use zyrtec daily to help prevent

## 2019-12-30 NOTE — ED Triage Notes (Signed)
Sob this am.  Took inhalers without relief.  Pt called pcp and was instructed to come to ED.  Pt c/o chest tightness and sob

## 2019-12-30 NOTE — ED Notes (Signed)
ED Provider at bedside. 

## 2019-12-30 NOTE — ED Provider Notes (Signed)
[facility] EMERGENCY DEPARTMENT Provider Note   CSN: [id number] Arrival date & time: 12/30/19  1121     History Chief Complaint  Patient presents with  . Shortness of Breath    Michelle Kennedy is a 64 y.o. female.  HPI  Patient is a 64 year old female with a history of anemia, COPD, asthma, mild congestive heart failure with preserved EF.  Patient is presenting today with wheezing and shortness of breath and cough that began this morning.  She states she took her fluticasone inhaler and albuterol without relief.  She called her PCP and was directed come to emergency department.  She states she has chest tightness but no chest pain.  She denies any fevers, chills states her cough is nonproductive.  She states that she has a history of seasonal allergies and states that she is allergic to pine trees outside her house however she states that she has not had a reaction like this in the past.  She denies any nausea or vomiting hives or tongue or neck swelling.   Denies lightheadedness dizziness fatigue or malaise.    Past Medical History:  Diagnosis Date  . Abnormally small mouth   . Achilles tendon disorder, right   . Anemia   . Anxiety   . Arthritis    hips and knees  . Asthma    daily inhaler, prn inhaler and neb.  . Asthma due to environmental allergies   . Breast cancer ([place]) 01/2014   left  . Cancer ([place])   . Chest pain   . COPD (chronic obstructive pulmonary disease) ([place])   . Dental bridge present    upper front and lower right  . Dental crowns present    x 3  . Depression   . Esophageal spasm    reports since her chemo and breast surgery  she developed esophageal spasms and reports this is in the past has casue her 02 to desat in the  70s , denies sycnope in relation to this , does report hx of vertigo as well   . Family history of anesthesia complication    twin brother aspirated and died on OR table, per pt.  . Fibromyalgia   . Gallbladder disease     . GERD (gastroesophageal reflux disease)   . H/O blood clots    had blood to  PICC line   . History of gastric ulcer    as a teenager  . History of seizure age 63   as a reaction to Penicillin - no seizures since  . History of stomach ulcers   . History of thyroid cancer    s/p thyroidectomy  . Hyperlipidemia   . Hypothyroidism   . Joint pain   . Left knee injury   . Liver disorder    seen when she had her hysterectomy , reports she was told it was  " small lesion" ; but asymptomatic   . Lymphedema    left arm  . Migraines   . Multiple food allergies   . Obesity   . OSA (obstructive sleep apnea) 02/16/2016  . Palpitations    reports no longer experiences  . Personal history of chemotherapy 2015  . Personal history of radiation therapy 2015   Left  . Prediabetes   . Rheumatoid arthritis ([place])   . SOB (shortness of breath)   . Swelling of both ankles   . Tinnitus   . UTI (lower urinary tract infection) 02/17/2014  . Vertigo  last episode  was 2 weeks ago   . Vitamin D deficiency   . Wears contact lenses    left eye only   . Wears hearing aid in both ears     Patient Active Problem List   Diagnosis Date Noted  . Noncompliance with medications 08/23/2019  . Chronic diastolic CHF (congestive heart failure) ([place]) 08/23/2019  . Retinal artery occlusion 08/22/2019  . OA (osteoarthritis) of knee 12/03/2017  . Class 3 obesity without serious comorbidity with body mass index (BMI) of 40.0 to 44.9 in adult 01/01/2017  . Essential hypertension 10/18/2016  . Constipation 10/04/2016  . Vitamin D deficiency 10/04/2016  . Prediabetes 10/04/2016  . Obesity, Class III, BMI 40-49.9 (morbid obesity) ([place]) 09/20/2016  . Other fatigue 09/20/2016  . OSA (obstructive sleep apnea) 02/16/2016  . Right ankle pain 01/20/2016  . Strain of right Achilles tendon 11/30/2015  . Benign hypertension 10/11/2015  . Malignant neoplasm of thyroid gland ([place]) 10/11/2015  . De Quervain's  tenosynovitis, bilateral 09/15/2015  . Right shoulder pain 09/15/2015  . Trigger finger, acquired 06/22/2015  . Left wrist pain 06/22/2015  . Thumb injury 06/14/2015  . 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 Postmenopausal osteoporosis 12/08/2014  . Preventative health care 12/01/2014  . DVT (deep venous thrombosis) ([place]) 08/28/2014  . Lymphedema of arm 08/28/2014  . Mucositis due to chemotherapy 06/02/2014  . UTI symptoms 02/17/2014  . Menopausal syndrome (hot flashes) 12/31/2013  . Breast cancer of upper-outer quadrant of left female breast ([place]) 12/26/2013  . 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 Gingivitis 12/05/2013  . Seasonal allergies 09/24/2013  . Skin lesion 06/03/2013  . Menopause 06/02/2013  . Elevated blood-pressure reading without diagnosis of hypertension 03/05/2013  . Cough 07/24/2012  . Left knee injury 11/02/2011  . Gout 09/19/2011  . Hyperlipidemia 06/02/2011  . Migraine 06/02/2011  . Tinnitus of both ears 02/14/2011  . Left knee pain 09/23/2010  . FIBROMYALGIA 05/10/2010  . Asthma 03/22/2010  . 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 OVERWEIGHT 12/20/2009  . GERD 12/20/2009  . Depression 12/01/2009    Past Surgical History:  Procedure Laterality Date  . ABDOMINAL HYSTERECTOMY  2004   complete  . ACHILLES TENDON REPAIR Right   . APPENDECTOMY  2004  . BREAST EXCISIONAL BIOPSY    . BREAST LUMPECTOMY Left    2015  . BREAST LUMPECTOMY WITH NEEDLE LOCALIZATION AND AXILLARY SENTINEL LYMPH NODE BX Left 02/23/2014   Procedure: BREAST LUMPECTOMY WITH NEEDLE LOCALIZATION AND AXILLARY SENTINEL LYMPH NODE BIOPSY;  Surgeon: Excell Seltzer, MD;  Location: [facility];  Service: General;  Laterality: Left;  . BREAST SURGERY  2011   left breast biposy  . CHOLECYSTECTOMY  1990  . COLONOSCOPY W/ POLYPECTOMY  06/2009  . EYE SURGERY Right 2013   exc. warts from underneath eyelid  . INCONTINENCE SURGERY   2004  . KNEE ARTHROSCOPY Bilateral    x 6 each knee  . LIGAMENT REPAIR Right    thumb/wrist  . ORIF TOE FRACTURE Right    great toe  . PORTACATH PLACEMENT N/A 03/24/2014   Procedure: INSERTION PORT-A-CATH;  Surgeon: Excell Seltzer, MD;  Location: [facility];  Service: General;  Laterality: N/A;; removed   . THYROIDECTOMY  2000  . TONSILLECTOMY AND ADENOIDECTOMY  2000  . TOTAL KNEE ARTHROPLASTY Left 12/03/2017   Procedure: LEFT TOTAL KNEE ARTHROPLASTY;  Surgeon: Gaynelle Arabian, MD;  Location: [facility];  Service: Orthopedics;  Laterality: Left;  .  TUMOR EXCISION     from thoracic spine     OB History    Gravida  3   Para  3   Term  3   Preterm      AB      Living  3     SAB      TAB      Ectopic      Multiple      Live Births              Family History  Problem Relation Age of Onset  . Alcohol abuse Mother   . Arthritis Mother   . Hypertension Mother   . Bipolar disorder Mother   . Breast cancer Mother 4       unconfirmed  . Lung cancer Mother 23       smoker  . Hyperlipidemia Mother   . Stroke Mother   . Cancer Mother   . Depression Mother   . Anxiety disorder Mother   . Alcoholism Mother   . Drug abuse Mother   . Eating disorder Mother   . Obesity Mother   . Alcohol abuse Father   . Cancer Father        lung  . Hyperlipidemia Father   . Kidney disease Father   . Diabetes Father   . Hypertension Father   . Cystic kidney disease Father   . Thyroid disease Father   . Liver disease Father   . Alcoholism Father   . Arthritis Maternal Grandmother   . Diabetes Paternal Grandmother   . Thyroid cancer Sister 67       type?; currently 26  . Other Sister        ovarian tumor @ 2; TAH/BSO  . Breast cancer Sister   . Thyroid cancer Brother        dx 15s; currently 56  . Breast cancer Maternal Aunt        dx 72s; deceased 34  . Thyroid cancer Paternal Aunt        All 3 paternal aunts with thyroid ca in 30s/40s  . Lung cancer  Paternal Aunt        2 of 3 paternal aunts with lung cancer    Social History   Tobacco Use  . Smoking status: Never Smoker  . Smokeless tobacco: Never Used  Vaping Use  . Vaping Use: Never used  Substance Use Topics  . Alcohol use: No    Alcohol/week: 0.0 standard drinks  . Drug use: No    Home Medications Prior to Admission medications   Medication Sig Start Date End Date Taking? Authorizing Provider  albuterol (PROVENTIL) (5 MG/ML) 0.5% nebulizer solution Take 0.5 mLs (2.5 mg total) by nebulization every 6 (six) hours as needed for wheezing or shortness of breath. 07/31/18   Debbrah Alar, NP  albuterol (VENTOLIN HFA) 108 (90 Base) MCG/ACT inhaler Inhale 2 puffs into the lungs every 6 (six) hours as needed for wheezing or shortness of breath. 11/07/19   Debbrah Alar, NP  ALPHAGAN P 0.1 % SOLN Place 1 drop into both eyes in the morning and at bedtime.  09/09/19   [provider]  anastrozole (ARIMIDEX) 1 MG tablet Take 1 tablet (1 mg total) by mouth daily. 07/23/19   Nicholas Lose, MD  atorvastatin (LIPITOR) 40 MG tablet Take 1 tablet (40 mg total) by mouth daily. 12/08/19   Debbrah Alar, NP  Blood Pressure KIT Check blood sugar as directed 09/15/19  Debbrah Alar, NP  CALCIUM PO Take 1 tablet by mouth every evening.    [provider]  cholecalciferol (VITAMIN D3) 25 MCG (1000 UNIT) tablet Take 1,000 Units by mouth every evening.     [provider]  clopidogrel (PLAVIX) 75 MG tablet Take 1 tablet (75 mg total) by mouth daily. Patient taking differently: Take 75 mg by mouth every evening.  08/24/19   Annita Brod, MD  EPINEPHrine 0.3 mg/0.3 mL IJ SOAJ injection Inject 0.3 mLs (0.3 mg total) into the muscle once. Patient taking differently: Inject 0.3 mg into the muscle as needed for anaphylaxis.  09/24/13   Debbrah Alar, NP  fluticasone (FLONASE) 50 MCG/ACT nasal spray Place 2 sprays into both nostrils daily as needed for  allergies. 08/23/19   Annita Brod, MD  Fluticasone-Salmeterol [facility] INHUB) 250-50 MCG/DOSE AEPB Inhale 1 puff into the lungs 2 (two) times daily. 11/21/19   Debbrah Alar, NP  furosemide (LASIX) 20 MG tablet TAKE 1 TABLET(20 MG) BY MOUTH DAILY AS NEEDED FOR SWELLING 12/18/19   Debbrah Alar, NP  GEMTESA 75 MG TABS Take 1 tablet by mouth daily. 12/09/19   [provider]  levothyroxine (SYNTHROID) 175 MCG tablet Take 1 tablet (175 mcg total) by mouth daily before breakfast. 07/18/19   Philemon Kingdom, MD  levothyroxine (SYNTHROID) 200 MCG tablet TAKE 1 TABLET BY MOUTH  DAILY BEFORE BREAKFAST 12/14/19   Debbrah Alar, NP  lisinopril (ZESTRIL) 20 MG tablet Take 1 tablet (20 mg total) by mouth daily. 10/31/19   Debbrah Alar, NP  meloxicam (MOBIC) 7.5 MG tablet TAKE 1 TABLET BY MOUTH  DAILY 12/14/19   Debbrah Alar, NP  methocarbamol (ROBAXIN) 500 MG tablet Take 1 tablet (500 mg total) by mouth every 8 (eight) hours as needed for muscle spasms. 12/17/19   Hilts, Legrand Como, MD  Multiple Vitamin (MULTIVITAMIN ADULT) TABS Take 1 tablet by mouth daily.    [provider]  Multiple Vitamins-Minerals (OCUVITE PO) Take 1 tablet by mouth daily.    [provider]  MYRBETRIQ 50 MG TB24 tablet TAKE 1 TABLET BY MOUTH  DAILY 06/16/19   Debbrah Alar, NP  Omega-3 Fatty Acids (FISH OIL) 1000 MG CAPS Take 1 capsule by mouth every evening.     [provider]  predniSONE (DELTASONE) 10 MG tablet Take 4 tablets (40 mg total) by mouth daily for 4 days. 12/30/19 01/03/20  Tedd Sias, PA  SUMAtriptan (IMITREX) 50 MG tablet Take 1 tablet by mouth at start of migraine, may repeat in 2 hours if not improved. Max 2 tabs/24 hrs Patient taking differently: Take 50 mg by mouth every 2 (two) hours as needed for migraine. Take 1 tablet by mouth at start of migraine, may repeat in 2 hours if not improved. Max 2 tabs/24 hrs 01/13/19   Debbrah Alar, NP    terbinafine (LAMISIL) 250 MG tablet Take 1 tablet (250 mg total) by mouth daily. 09/15/19   Debbrah Alar, NP  traMADol (ULTRAM) 50 MG tablet Take 1 tablet (50 mg total) by mouth every 6 (six) hours as needed. 12/17/19   Hilts, Legrand Como, MD  meloxicam (MOBIC) 7.5 MG tablet TAKE 1 TABLET BY MOUTH  DAILY 10/19/18   Debbrah Alar, NP    Allergies    Bee venom, Contrast media [iodinated diagnostic agents], Iodine, Latex, Penicillins, Shellfish allergy, Aspirin, Erythromycin, Lidocaine, Symbicort [budesonide-formoterol fumarate], Betadine [povidone iodine], Codeine, Compazine [prochlorperazine maleate], Pentazocine lactate, and Sulfonamide derivatives  Review of Systems   Review of  Systems  Constitutional: Negative for chills, fatigue and fever.  HENT: Negative for congestion.   Eyes: Negative for pain.  Respiratory: Positive for cough, chest tightness, shortness of breath and wheezing. Negative for choking.   Cardiovascular: Negative for chest pain and leg swelling.  Gastrointestinal: Negative for abdominal pain and vomiting.  Genitourinary: Negative for dysuria.  Musculoskeletal: Negative for myalgias.  Skin: Negative for rash.  Neurological: Negative for dizziness and headaches.    Physical Exam Updated Vital Signs BP (!) 147/62   Pulse 73   Temp 98.2 F (36.8 C) (Oral)   Resp 20   Ht _0  (1.702 m)   Wt 131.5 kg   SpO2 97%   BMI 45.42 kg/m   Physical Exam Vitals and nursing note reviewed.  Constitutional:      Appearance: She is obese.  HENT:     Head: Normocephalic and atraumatic.     Nose: Nose normal.  Eyes:     General: No scleral icterus. Cardiovascular:     Rate and Rhythm: Normal rate and regular rhythm.     Pulses: Normal pulses.     Heart sounds: Normal heart sounds.  Pulmonary:     Effort: No respiratory distress.     Breath sounds: Wheezing present.     Comments: Patient with increased work of breathing with accessory chest muscle usage.  No  nasal flaring.  Speaking in short sentences.  Significant inspiratory and expiratory wheezing and decreased airflow.  Patient has received 1 nebulizer from respiratory therapy prior to my evaluation. Abdominal:     Palpations: Abdomen is soft.     Tenderness: There is no abdominal tenderness. There is no guarding or rebound.  Musculoskeletal:     Cervical back: Normal range of motion.     Right lower leg: No edema.     Left lower leg: No edema.     Comments: No calf tenderness.  No bruising or swelling.  Skin:    General: Skin is warm and dry.     Capillary Refill: Capillary refill takes less than 2 seconds.  Neurological:     Mental Status: She is alert. Mental status is at baseline.  Psychiatric:        Mood and Affect: Mood normal.        Behavior: Behavior normal.     ED Results / Procedures / Treatments   Labs (all labs ordered are listed, but only abnormal results are displayed) Labs Reviewed  BASIC METABOLIC PANEL - Abnormal; Notable for the following components:      Result Value   Glucose, Bld 106 (*)    All other components within normal limits  CBC WITH DIFFERENTIAL/PLATELET - Abnormal; Notable for the following components:   RBC 5.88 (*)    HCT 46.1 (*)    MCV 78.4 (*)    MCH 24.7 (*)    RDW 16.3 (*)    All other components within normal limits    EKG EKG Interpretation  Date/Time:  Tuesday December 30 2019 11:32:26 EDT Ventricular Rate:  82 PR Interval:  162 QRS Duration: 82 QT Interval:  382 QTC Calculation: 446 R Axis:   53 Text Interpretation: Normal sinus rhythm with sinus arrhythmia nonspecific ST/T waves, though similar to may 2021 Confirmed by Sherwood Gambler [phone]) on 12/30/2019 11:47:29 AM   Radiology DG Chest Portable 1 View  Result Date: 12/30/2019 CLINICAL DATA:  Shortness of breath EXAM: PORTABLE CHEST 1 VIEW COMPARISON:  July 31, 2018 FINDINGS: Mild scarring in  the lingula noted. Lungs otherwise clear. Heart is upper normal in size with  pulmonary vascularity normal. No adenopathy. No bone lesions IMPRESSION: Stable mild scarring in the lingula. No edema or airspace opacity. Heart upper normal in size. No adenopathy evident. Electronically Signed   By: Lowella Grip III M.D.   On: 12/30/2019 12:38    Procedures Procedures (including critical care time)  Medications Ordered in ED Medications  0.9 %  sodium chloride infusion ( Intravenous Stopped 12/30/19 1400)  ipratropium-albuterol (DUONEB) 0.5-2.5 (3) MG/3ML nebulizer solution (3 mLs  Given 12/30/19 1143)  magnesium sulfate IVPB 2 g 50 mL (0 g Intravenous Stopped 12/30/19 1400)  ipratropium-albuterol (DUONEB) 0.5-2.5 (3) MG/3ML nebulizer solution 3 mL (3 mLs Nebulization Given 12/30/19 1236)  predniSONE (DELTASONE) tablet 60 mg (60 mg Oral Given 12/30/19 1254)    ED Course  I have reviewed the triage vital signs and the nursing notes.  Pertinent labs & imaging results that were available during my care of the patient were reviewed by me and considered in my medical decision making (see chart for details).  Patient is 64 year old female presented today with wheezing shortness of breath and chest tightness.  She states that this feels like asthma exacerbation she has had in the past however it came on this morning and she has had difficulty breathing and has not been able to abate her wheezing with inhalers.  Physical exam is notable for wheezing and increased work of breathing is cough and tachypnea.  Clinical Course as of Dec 29 1444  Tue Dec 30, 2019  1331 IMPRESSION: Chest xray --agree of radiology.  No acute abnormality.  No evidence of bacterial pneumonia. Stable mild scarring in the lingula. No edema or airspace opacity. Heart upper normal in size. No adenopathy evident.   [WF]  1410 BMP without any acute abnormality.  CBC with mild hemoconcentration likely secondary to dehydration.   [WF]  1411 EKG with no acute abnormalities.  Normal sinus rhythm with no axis  deviation significant hypertrophy or significant contiguous ST-T wave abnormalities.   [WF]  6063 Patient reassessed after treatment and feels significantly improved.  She states that she was 8/10 for difficulty breathing before and now feels that she is a 1/10.  She is agreeable to discharge with prednisone for 4 days.   [WF]  1411 She will follow-up with her pulmonologist and PCP.  She will continue use her Home fluticasone inhaler and albuterol inhaler as needed.  She is given return precautions.  I did have a shared decision-making physician with her regarding my moderate to low suspicion for pulmonary embolism given that she had a provoked PE during her breast cancer years ago.  She states that she prefer to be discharged home and will return if she has any new or concerning symptoms.  I discussed this with attending physician who was also at bedside and he agrees with plan.   [WF]  1412 I discussed this case with my attending physician who cosigned this note including patient's presenting symptoms, physical exam, and planned diagnostics and interventions. Attending physician stated agreement with plan or made changes to plan which were implemented.   Attending physician assessed patient at bedside.   [WF]  1412 On my exam during repeat assessment after magnesium, prednisone, second DuoNeb is no longer wheezing.  Has improved work of breathing.  His breathing without difficulty, SPO2 100% on room air.  Not tachypneic or tachycardic.  Lung sounds normal at this time.   [WF]  Clinical Course User Index [WF] Tedd Sias, [place]   MDM Rules/Calculators/A&P                          Patient is much improved we will discharge at this time with close follow-up with PCP and pulmonology.  She is given return precautions.  Discharged with 4 days of 40 mg prednisone daily.  Final Clinical Impression(s) / ED Diagnoses Final diagnoses:  Moderate asthma with exacerbation, unspecified whether  persistent    Rx / DC Orders ED Discharge Orders         Ordered    predniSONE (DELTASONE) 10 MG tablet  Daily     Discontinue  Reprint     12/30/19 [place], Lacinda Curvin [place], [place] 12/30/19 1449    Sherwood Gambler, MD 12/31/19 0945

## 2019-12-30 NOTE — Telephone Encounter (Signed)
Patient currently in ER.    [facility] - Client TELEPHONE ADVICE RECORD Access[facility]urse Patient Name: Michelle Kennedy Gender: Female DOB: 14-Dec-1955 Age: 64 Y 66 M 13 D Return Phone Number: [phone] (Primary), [phone] (Secondary) Address: City/State/Zip: [place] [address] Client [facility] - Client Client Site [facility] - Day Physician Debbrah Alar - NP Contact Type Call Who Is Calling Patient / Member / Family / Caregiver Call Type Triage / Clinical Relationship To Patient Self Return Phone Number [phone] (Primary) Chief Complaint BREATHING - shortness of breath or sounds breathless Reason for Call Symptomatic / Request for [place] having trouble with her asthma, she has used her daily inhaler and emergency inhaler and is still struggling. Translation No Nurse Assessment Nurse: Ysidro Evert, RN, Levada Dy Date/Time (Eastern Time): 12/30/2019 10:53:51 AM Confirm and document reason for call. If symptomatic, describe symptoms. ---Caller states she has asthma and she is having shortness of breath even after using her rescue inhaler twice she is having shortness of breath and she is wheezing. No fever Has the patient had close contact with a person known or suspected to have the novel coronavirus illness OR traveled / lives in area with major community spread (including international travel) in the last 14 days from the onset of symptoms? * If Asymptomatic, screen for exposure and travel within the last 14 days. ---No Does the patient have any new or worsening symptoms? ---Yes Will a triage be completed? ---Yes Related visit to physician within the last 2 weeks? ---No Does the PT have any chronic conditions? (i.e. diabetes, asthma, this includes High risk factors for pregnancy, etc.) ---Yes List chronic conditions. ---asthma Is this a behavioral health or  substance abuse call? ---No Guidelines Guideline Title Affirmed Question Affirmed Notes Nurse Date/Time (Eastern Time) Asthma Attack [1] SEVERE asthma attack (e.g., very SOB at rest, speaks in single words, loud wheezes) AND [2] not resolved Ysidro Evert, RN, Levada Dy 12/30/2019 10:56:04 AM PLEASE NOTE: All timestamps contained within this report are represented as [place] Standard Time. CONFIDENTIALTY NOTICE: This fax transmission is intended only for the addressee. It contains information that is legally privileged, confidential or otherwise protected from use or disclosure. If you are not the intended recipient, you are strictly prohibited from reviewing, disclosing, copying using or disseminating any of this information or taking any action in reliance on or regarding this information. If you have received this fax in error, please notify us immediately by telephone so that we can arrange for its return to [place]. Phone: [phone], Toll-Free: [phone], Fax: [phone] Page: 2 of 2 Call Id: [id number] Guidelines Guideline Title Affirmed Question Affirmed Notes Nurse Date/Time Eilene Ghazi Time) after 2 nebulizer or inhaler treatments Disp. Time Eilene Ghazi Time) Disposition Final User 12/30/2019 10:52:17 AM Send to Urgent Ashok Pall 12/30/2019 10:58:50 AM Go to ED Now Yes Ysidro Evert, RN, [place] Shutter Disagree/Comply Comply Caller Understands Yes PreDisposition Did not know what to do Care Advice Given Per Guideline GO TO ED NOW: * You need to be seen in the Emergency Department. * Go to the ED at ___________ [facility] now. Drive carefully. * Another adult should drive. * Take 4 puffs on your quick-relief inhaler right now. ASTHMA QUICK-RELIEF INHALER (E.G., ALBUTEROL, SALBUTAMOL, XOPENEX): BRING MEDICINES: CARE ADVICE given per Asthma [place]

## 2020-01-02 ENCOUNTER — Encounter: Payer: Self-pay | Admitting: Family

## 2020-01-02 ENCOUNTER — Telehealth: Payer: Self-pay | Admitting: Family

## 2020-01-02 NOTE — Telephone Encounter (Signed)
Medication: albuterol (PROVENTIL) (5 MG/ML) 0.5% nebulizer solution [[id number]]       Has the patient contacted their pharmacy?  (If no, request that the patient contact the pharmacy for the refill.) (If yes, when and what did the pharmacy advise?)     Preferred Pharmacy (with phone number or street name): [id number] - [place], [place] - [address]  [address] [place], [place]  [address]  Phone:  [phone] Fax:  [phone]      Agent: Please be advised that RX refills may take up to 3 business days. We ask that you follow-up with your pharmacy.

## 2020-01-05 ENCOUNTER — Other Ambulatory Visit: Payer: Self-pay

## 2020-01-05 MED ORDER — ALBUTEROL SULFATE (5 MG/ML) 0.5% IN NEBU: 2.5000 mg | INHALATION_SOLUTION | Freq: Four times a day (QID) | RESPIRATORY_TRACT | 3 refills | Status: DC | PRN

## 2020-01-05 MED ORDER — CLOPIDOGREL BISULFATE 75 MG PO TABS: 75.0000 mg | ORAL_TABLET | Freq: Every day | ORAL | 3 refills | Status: DC

## 2020-01-05 NOTE — Telephone Encounter (Signed)
Rx was sent  

## 2020-01-09 NOTE — Progress Notes (Signed)
NEUROLOGY CONSULTATION NOTE  Michelle Kennedy MRN: [id number] DOB: 08/27/1955  Referring provider: Debbrah Alar, NP Primary care provider: Debbrah Alar, NP  Reason for consult:  Right retinal artery occlusion  HISTORY OF PRESENT ILLNESS: Michelle Kennedy. Maurer is a 64 year old left-handed female with COPD, HTN, HLD, RA, acquired hypothyroidism secondary to treatment for thyroid cancer, and migraines who presents for retinal artery occlusion.  History supplemented by hospital and referring provider's notes.  CT head and MRI and MRA of head from April personally reviewed.  She was admitted to [facility] on 08/22/2019 for right branch retinal artery occlusion.  She presented with 1 week of vertical stacked double vision only in the right eye.  She had a severe headache  CT head showed no acute intracranial abnormality.  MRI of brain was negative for acute or subacute infarction.  She was unable to have CTA due to iodine allergy, but MRA of head showed no large vessel occlusion or high-grade stenosis.  Carotid doppler showed no hemodynamically significant stenosis.  2D echocardiogram EF 55-60%.  Interatrial septum was not well visualized.  Covid testing was negative.  LDL was 102, Hgb A1c 6.1.  She was discharged on Plavix 45m daily (she has ASA allergy), continued on Zestril and Lipitor increased from 261mto 4055maily.  Visual symptoms persist.  On 09/24/2019, she developed blurred vision and then double vision.  She presented to the ED for further evaluation.  Blood pressure was 153/79.  She endorsed double vision even when closing one eye.  Given monocular double vision, intrinsic eye etiology suspected.    PAST MEDICAL HISTORY: Past Medical History:  Diagnosis Date  . Abnormally small mouth   . Achilles tendon disorder, right   . Anemia   . Anxiety   . Arthritis    hips and knees  . Asthma    daily inhaler, prn inhaler and neb.  . Asthma due to environmental allergies   .  Breast cancer ([facility]/2015   left  . Cancer ([facility] . Chest pain   . COPD (chronic obstructive pulmonary disease) ([facility] . Dental bridge present    upper front and lower right  . Dental crowns present    x 3  . Depression   . Esophageal spasm    reports since her chemo and breast surgery  she developed esophageal spasms and reports this is in the past has casue her 02 to desat in the  70s , denies sycnope in relation to this , does report hx of vertigo as well   . Family history of anesthesia complication    twin brother aspirated and died on OR table, per pt.  . Fibromyalgia   . Gallbladder disease   . GERD (gastroesophageal reflux disease)   . H/O blood clots    had blood to  PICC line   . History of gastric ulcer    as a teenager  . History of seizure age [age]as a reaction to Penicillin - no seizures since  . History of stomach ulcers   . History of thyroid cancer    s/p thyroidectomy  . Hyperlipidemia   . Hypothyroidism   . Joint pain   . Left knee injury   . Liver disorder    seen when she had her hysterectomy , reports she was told it was  " small lesion" ; but asymptomatic   . Lymphedema    left arm  .  Migraines   . Multiple food allergies   . Obesity   . OSA (obstructive sleep apnea) 02/16/2016  . Palpitations    reports no longer experiences  . Personal history of chemotherapy 2015  . Personal history of radiation therapy 2015   Left  . Prediabetes   . Rheumatoid arthritis ([place])   . SOB (shortness of breath)   . Swelling of both ankles   . Tinnitus   . UTI (lower urinary tract infection) 02/17/2014  . Vertigo    last episode  was 2 weeks ago   . Vitamin D deficiency   . Wears contact lenses    left eye only   . Wears hearing aid in both ears     PAST SURGICAL HISTORY: Past Surgical History:  Procedure Laterality Date  . ABDOMINAL HYSTERECTOMY  2004   complete  . ACHILLES TENDON REPAIR Right   . APPENDECTOMY  2004  . BREAST EXCISIONAL BIOPSY      . BREAST LUMPECTOMY Left    2015  . BREAST LUMPECTOMY WITH NEEDLE LOCALIZATION AND AXILLARY SENTINEL LYMPH NODE BX Left 02/23/2014   Procedure: BREAST LUMPECTOMY WITH NEEDLE LOCALIZATION AND AXILLARY SENTINEL LYMPH NODE BIOPSY;  Surgeon: Excell Seltzer, MD;  Location: [place];  Service: General;  Laterality: Left;  . BREAST SURGERY  2011   left breast biposy  . CHOLECYSTECTOMY  1990  . COLONOSCOPY W/ POLYPECTOMY  06/2009  . EYE SURGERY Right 2013   exc. warts from underneath eyelid  . INCONTINENCE SURGERY  2004  . KNEE ARTHROSCOPY Bilateral    x 6 each knee  . LIGAMENT REPAIR Right    thumb/wrist  . ORIF TOE FRACTURE Right    great toe  . PORTACATH PLACEMENT N/A 03/24/2014   Procedure: INSERTION PORT-A-CATH;  Surgeon: Excell Seltzer, MD;  Location: [place];  Service: General;  Laterality: N/A;; removed   . THYROIDECTOMY  2000  . TONSILLECTOMY AND ADENOIDECTOMY  2000  . TOTAL KNEE ARTHROPLASTY Left 12/03/2017   Procedure: LEFT TOTAL KNEE ARTHROPLASTY;  Surgeon: Gaynelle Arabian, MD;  Location: [facility];  Service: Orthopedics;  Laterality: Left;  . TUMOR EXCISION     from thoracic spine    MEDICATIONS: Current Outpatient Medications on File Prior to Visit  Medication Sig Dispense Refill  . albuterol (PROVENTIL) (5 MG/ML) 0.5% nebulizer solution Take 0.5 mLs (2.5 mg total) by nebulization every 6 (six) hours as needed for wheezing or shortness of breath. 40 mL 3  . ALPHAGAN P 0.1 % SOLN Place 1 drop into both eyes in the morning and at bedtime.     Marland Kitchen anastrozole (ARIMIDEX) 1 MG tablet Take 1 tablet (1 mg total) by mouth daily. 90 tablet 3  . atorvastatin (LIPITOR) 40 MG tablet Take 1 tablet (40 mg total) by mouth daily. 90 tablet 1  . Blood Pressure KIT Check blood sugar as directed 1 kit 0  . CALCIUM PO Take 1 tablet by mouth every evening.    . cholecalciferol (VITAMIN D3) 25 MCG (1000 UNIT) tablet Take 1,000 Units by mouth every evening.     .  clopidogrel (PLAVIX) 75 MG tablet Take 1 tablet (75 mg total) by mouth daily. 30 tablet 3  . EPINEPHrine 0.3 mg/0.3 mL IJ SOAJ injection Inject 0.3 mLs (0.3 mg total) into the muscle once. (Patient taking differently: Inject 0.3 mg into the muscle as needed for anaphylaxis. ) 2 Device 0  . fluticasone (FLONASE) 50 MCG/ACT nasal spray Place 2 sprays  into both nostrils daily as needed for allergies.    . Fluticasone-Salmeterol (WIXELA INHUB) 250-50 MCG/DOSE AEPB Inhale 1 puff into the lungs 2 (two) times daily. 180 each 3  . furosemide (LASIX) 20 MG tablet TAKE 1 TABLET(20 MG) BY MOUTH DAILY AS NEEDED FOR SWELLING 30 tablet 3  . GEMTESA 75 MG TABS Take 1 tablet by mouth daily.    Marland Kitchen levothyroxine (SYNTHROID) 175 MCG tablet Take 1 tablet (175 mcg total) by mouth daily before breakfast. 45 tablet 3  . levothyroxine (SYNTHROID) 200 MCG tablet TAKE 1 TABLET BY MOUTH  DAILY BEFORE BREAKFAST 90 tablet 0  . lisinopril (ZESTRIL) 20 MG tablet Take 1 tablet (20 mg total) by mouth daily. 90 tablet 1  . meloxicam (MOBIC) 7.5 MG tablet TAKE 1 TABLET BY MOUTH  DAILY 90 tablet 1  . methocarbamol (ROBAXIN) 500 MG tablet Take 1 tablet (500 mg total) by mouth every 8 (eight) hours as needed for muscle spasms. 60 tablet 1  . Multiple Vitamin (MULTIVITAMIN ADULT) TABS Take 1 tablet by mouth daily.    . Multiple Vitamins-Minerals (OCUVITE PO) Take 1 tablet by mouth daily.    Marland Kitchen MYRBETRIQ 50 MG TB24 tablet TAKE 1 TABLET BY MOUTH  DAILY 90 tablet 1  . Omega-3 Fatty Acids (FISH OIL) 1000 MG CAPS Take 1 capsule by mouth every evening.     . SUMAtriptan (IMITREX) 50 MG tablet Take 1 tablet by mouth at start of migraine, may repeat in 2 hours if not improved. Max 2 tabs/24 hrs (Patient taking differently: Take 50 mg by mouth every 2 (two) hours as needed for migraine. Take 1 tablet by mouth at start of migraine, may repeat in 2 hours if not improved. Max 2 tabs/24 hrs) 10 tablet 2  . terbinafine (LAMISIL) 250 MG tablet Take 1  tablet (250 mg total) by mouth daily. 30 tablet 1  . traMADol (ULTRAM) 50 MG tablet Take 1 tablet (50 mg total) by mouth every 6 (six) hours as needed. 30 tablet 0  . [DISCONTINUED] meloxicam (MOBIC) 7.5 MG tablet TAKE 1 TABLET BY MOUTH  DAILY 90 tablet 1   No current facility-administered medications on file prior to visit.    ALLERGIES: Allergies  Allergen Reactions  . Bee Venom Anaphylaxis  . Contrast Media [Iodinated Diagnostic Agents] Shortness Of Breath  . Iodine Other (See Comments)    CARDIAC ARREST  . Latex Anaphylaxis and Rash  . Penicillins Shortness Of Breath, Rash and Other (See Comments)    SEIZURE Did it involve swelling of the face/tongue/throat, SOB, or low BP? no Did it involve sudden or severe rash/hives, skin peeling, or any reaction on the inside of your mouth or nose? yes Did you need to seek medical attention at a hospital or doctor's office? yes When did it last happen?2010 If all above answers are "NO", may proceed with cephalosporin use.   . Shellfish Allergy Shortness Of Breath and Rash  . Aspirin Rash and Other (See Comments)    GI UPSET  . Erythromycin Swelling and Rash    SWELLING OF JOINTS  . Lidocaine Swelling    SWELLING OF MOUTH AND THROAT  . Symbicort [Budesonide-Formoterol Fumarate] Other (See Comments)    BURNING OF TONGUE AND LIPS  . Betadine [Povidone Iodine]     Rash. Breathing problems.   . Codeine Rash  . Compazine [Prochlorperazine Maleate] Rash    Rash on face,chest, arms, back  . Pentazocine Lactate Rash  . Sulfonamide Derivatives Rash  FAMILY HISTORY: Family History  Problem Relation Age of Onset  . Alcohol abuse Mother   . Arthritis Mother   . Hypertension Mother   . Bipolar disorder Mother   . Breast cancer Mother 48       unconfirmed  . Lung cancer Mother 79       smoker  . Hyperlipidemia Mother   . Stroke Mother   . Cancer Mother   . Depression Mother   . Anxiety disorder Mother   . Alcoholism Mother     . Drug abuse Mother   . Eating disorder Mother   . Obesity Mother   . Alcohol abuse Father   . Cancer Father        lung  . Hyperlipidemia Father   . Kidney disease Father   . Diabetes Father   . Hypertension Father   . Cystic kidney disease Father   . Thyroid disease Father   . Liver disease Father   . Alcoholism Father   . Arthritis Maternal Grandmother   . Diabetes Paternal Grandmother   . Thyroid cancer Sister 38       type?; currently 48  . Other Sister        ovarian tumor @ 53; TAH/BSO  . Breast cancer Sister   . Thyroid cancer Brother        dx 52s; currently 59  . Breast cancer Maternal Aunt        dx 67s; deceased 36  . Thyroid cancer Paternal Aunt        All 3 paternal aunts with thyroid ca in 30s/40s  . Lung cancer Paternal Aunt        2 of 3 paternal aunts with lung cancer    SOCIAL HISTORY: Social History   Socioeconomic History  . Marital status: Married    Spouse name: Not on file  . Number of children: Not on file  . Years of education: Not on file  . Highest education level: Not on file  Occupational History  . 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 Smoking status: Never Smoker  . Smokeless tobacco: Never Used  Vaping Use  . Vaping Use: Never used  Substance and Sexual Activity  . Alcohol use: No    Alcohol/week: 0.0 standard drinks  . Drug use: No  . Sexual activity: Not Currently    Partners: Male    Comment: menarche age 55, P 2, first birth age 28, menopause age 4, Premarin x 10 yrs  Other Topics Concern  . Not on file  Social History Narrative   Regular exercise: yes      Social Determinants of Health   Financial Resource Strain:   . Difficulty of Paying Living Expenses: Not on file  Food Insecurity:   . Worried About Charity fundraiser in the Last Year: Not on file  . Ran Out of Food in the Last Year: Not on file  Transportation Needs:   . Lack of Transportation (Medical): Not on file  . Lack  of Transportation (Non-Medical): Not on file  Physical Activity:   . Days of Exercise per Week: Not on file  . Minutes of Exercise per Session: Not on file  Stress:   . Feeling of Stress : Not on file  Social Connections:   . Frequency of Communication with Friends and Family: Not on file  . Frequency of Social Gatherings with Friends and Family: Not on file  . Attends  Religious Services: Not on file  . Active Member of Clubs or Organizations: Not on file  . Attends Archivist Meetings: Not on file  . Marital Status: Not on file  Intimate Partner Violence:   . Fear of Current or Ex-Partner: Not on file  . Emotionally Abused: Not on file  . Physically Abused: Not on file  . Sexually Abused: Not on file    PHYSICAL EXAM: Blood pressure (!) 154/83, pulse (!) 102, height 5' 8"  (1.727 m), weight 294 lb 12.8 oz (133.7 kg), SpO2 95 %. General: No acute distress.  Patient appears well-groomed.  Head:  Normocephalic/atraumatic Eyes:  fundi examined but not visualized Neck: supple, no paraspinal tenderness, full range of motion Back: No paraspinal tenderness Heart: regular rate and rhythm Lungs: Clear to auscultation bilaterally. Vascular: No carotid bruits. Neurological Exam: Mental status: alert and oriented to person, place, and time, recent and remote memory intact, fund of knowledge intact, attention and concentration intact, speech fluent and not dysarthric, language intact. Cranial nerves: CN I: not tested CN II: pupils equal, round and reactive to light, visual fields intact CN III, IV, VI:  full range of motion, no nystagmus, no ptosis CN V: facial sensation intact CN VII: upper and lower face symmetric CN VIII: hearing intact CN IX, X: gag intact, uvula midline CN XI: sternocleidomastoid and trapezius muscles intact CN XII: tongue midline Bulk & Tone: normal, no fasciculations. Motor:  5/5 throughout  Sensation:  Pinprick sensation intact and vibratory sensation  mildly reduced in toes. Deep Tendon Reflexes:  1+ throughout, toes downgoing. Finger to nose testing:  Without dysmetria.   Gait:  Normal station and stride.  Romberg negative.  IMPRESSION: 1.  Right retinal artery occlusion 2.  Hypertension 3.  Hyperlipidemia  PLAN: 1.  Plavix 89m daily 2.  Atorvastatin 413mdaily (LDL goal less than 70) 3.  Blood pressure control 4.  Hgb A1c goal less than 7 5.  14 day cardiac event monitor/Zio patch 6.  Would avoid triptans 7.  Follow up in 6 months  Thank you for allowing me to take part in the care of this patient.  AdMetta ClinesDO  CC:  MeDebbrah AlarNP

## 2020-01-12 ENCOUNTER — Encounter: Payer: Self-pay | Admitting: Neurology

## 2020-01-12 ENCOUNTER — Other Ambulatory Visit: Payer: Self-pay

## 2020-01-12 ENCOUNTER — Ambulatory Visit (INDEPENDENT_AMBULATORY_CARE_PROVIDER_SITE_OTHER): Payer: 59 | Admitting: Neurology

## 2020-01-12 VITALS — BP 154/83 | HR 102 | Ht 68.0 in | Wt 294.8 lb

## 2020-01-12 DIAGNOSIS — E785 Hyperlipidemia, unspecified: Secondary | ICD-10-CM

## 2020-01-12 DIAGNOSIS — I10 Essential (primary) hypertension: Secondary | ICD-10-CM

## 2020-01-12 DIAGNOSIS — H349 Unspecified retinal vascular occlusion: Secondary | ICD-10-CM

## 2020-01-12 DIAGNOSIS — R002 Palpitations: Secondary | ICD-10-CM | POA: Diagnosis not present

## 2020-01-12 NOTE — Patient Instructions (Addendum)
1.  Will check 14 day cardiac event monitor. [place],  [phone] 2.  Continue Plavix 75mg  daily 3.  Continue atorvastatin 40mg  daily 4.  Blood pressure control 5--.  Follow up in 5-6 months.--

## 2020-01-14 ENCOUNTER — Telehealth: Payer: Self-pay | Admitting: Medical Oncology

## 2020-01-14 NOTE — Progress Notes (Signed)
Patient Care Team: Debbrah Alar, NP as PCP - General (Internal Medicine) Excell Seltzer, MD (Inactive) as Consulting Physician (General Surgery) Nicholas Lose, MD as Consulting Physician (Hematology and Oncology) [name], MD as Attending Physician (Radiation Oncology) Katheran James., MD (Endocrinology) Maxwell Marion, RN as Registered Nurse (Medical Oncology) Renelda Loma, OD as Consulting Physician (Optometry)  DIAGNOSIS:    ICD-10-CM   1. Malignant neoplasm of upper-outer quadrant of left breast in female, estrogen receptor positive ([place])  C50.412    Z17.0     SUMMARY OF ONCOLOGIC HISTORY: Oncology History  Breast cancer of upper-outer quadrant of left female breast ([place])  12/22/2013 Mammogram   Left breast upper-outer quadrant 1.8 x 1.4 x 1.6 cm mass with left axillary lymph node measuring 3.6 mm   12/30/2013 Breast MRI   Dominant enhancing left upper outer quadrant irregular mass encompassing a confluent area of abnormal clumped nodular enhancement, 5.5 cm overall, 6 mm masslike enhancement central left breast     02/23/2014 Surgery   Left lumpectomy: Invasive ductal carcinoma grade 2 spending 3.8 cm intermediate grade DCIS with lymphovascular invasion margins negative one out of 4 SLN positive, ER 99%, PR 100%, HER-2 negative ratio 0.74, T2, N1, M0 stage IIB   03/19/2014 PET scan   No evidence of distant metastatic disease   03/31/2014 - 08/18/2014 Chemotherapy   Adjuvant chemotherapy with dose dense Adriamycin Cytoxan x4 followed by Abraxane weekly x12   08/28/2014 Imaging   Left brachial vein thrombus   09/11/2014 -  Radiation Therapy   Adjuvant radiation therapy   11/20/2014 -  Anti-estrogen oral therapy   Anastrozole 1 mg daily + Ibrance ([name] clinical trial)     CHIEF COMPLIANT: Follow-up of left breast cancer on anastrozole   INTERVAL HISTORY: Michelle Kennedy is a 64 y.o. with above-mentioned history of left breast cancer who  underwent a lumpectomy, adjuvant chemotherapy, radiation, and is currently on anastrozole. She participated in the [place] clinical trial and took 2 years of Ibrance along with anastrozole. Mammogram on 04/09/19 showed no evidence of malignancy bilaterally. She presents to the clinic today for follow-up.    ALLERGIES:  is allergic to bee venom, contrast media [iodinated diagnostic agents], iodine, latex, penicillins, shellfish allergy, aspirin, erythromycin, lidocaine, symbicort [budesonide-formoterol fumarate], betadine [povidone iodine], codeine, compazine [prochlorperazine maleate], pentazocine lactate, and sulfonamide derivatives.  MEDICATIONS:  Current Outpatient Medications  Medication Sig Dispense Refill   albuterol (PROVENTIL) (5 MG/ML) 0.5% nebulizer solution Take 0.5 mLs (2.5 mg total) by nebulization every 6 (six) hours as needed for wheezing or shortness of breath. 40 mL 3   ALPHAGAN P 0.1 % SOLN Place 1 drop into both eyes in the morning and at bedtime.      anastrozole (ARIMIDEX) 1 MG tablet Take 1 tablet (1 mg total) by mouth daily. 90 tablet 3   atorvastatin (LIPITOR) 40 MG tablet Take 1 tablet (40 mg total) by mouth daily. 90 tablet 1   Blood Pressure KIT Check blood sugar as directed 1 kit 0   CALCIUM PO Take 1 tablet by mouth every evening.     cholecalciferol (VITAMIN D3) 25 MCG (1000 UNIT) tablet Take 1,000 Units by mouth every evening.      clopidogrel (PLAVIX) 75 MG tablet Take 1 tablet (75 mg total) by mouth daily. 30 tablet 3   EPINEPHrine 0.3 mg/0.3 mL IJ SOAJ injection Inject 0.3 mLs (0.3 mg total) into the muscle once. (Patient taking differently: Inject 0.3 mg into the  muscle as needed for anaphylaxis. ) 2 Device 0   fluticasone (FLONASE) 50 MCG/ACT nasal spray Place 2 sprays into both nostrils daily as needed for allergies.     Fluticasone-Salmeterol (WIXELA INHUB) 250-50 MCG/DOSE AEPB Inhale 1 puff into the lungs 2 (two) times daily. 180 each 3   furosemide  (LASIX) 20 MG tablet TAKE 1 TABLET(20 MG) BY MOUTH DAILY AS NEEDED FOR SWELLING 30 tablet 3   GEMTESA 75 MG TABS Take 1 tablet by mouth daily.     levothyroxine (SYNTHROID) 175 MCG tablet Take 1 tablet (175 mcg total) by mouth daily before breakfast. 45 tablet 3   levothyroxine (SYNTHROID) 200 MCG tablet TAKE 1 TABLET BY MOUTH  DAILY BEFORE BREAKFAST 90 tablet 0   lisinopril (ZESTRIL) 20 MG tablet Take 1 tablet (20 mg total) by mouth daily. 90 tablet 1   meloxicam (MOBIC) 7.5 MG tablet TAKE 1 TABLET BY MOUTH  DAILY 90 tablet 1   methocarbamol (ROBAXIN) 500 MG tablet Take 1 tablet (500 mg total) by mouth every 8 (eight) hours as needed for muscle spasms. 60 tablet 1   Multiple Vitamin (MULTIVITAMIN ADULT) TABS Take 1 tablet by mouth daily.     Multiple Vitamins-Minerals (OCUVITE PO) Take 1 tablet by mouth daily.     MYRBETRIQ 50 MG TB24 tablet TAKE 1 TABLET BY MOUTH  DAILY 90 tablet 1   Omega-3 Fatty Acids (FISH OIL) 1000 MG CAPS Take 1 capsule by mouth every evening.      terbinafine (LAMISIL) 250 MG tablet Take 1 tablet (250 mg total) by mouth daily. 30 tablet 1   traMADol (ULTRAM) 50 MG tablet Take 1 tablet (50 mg total) by mouth every 6 (six) hours as needed. 30 tablet 0   No current facility-administered medications for this visit.    PHYSICAL EXAMINATION: ECOG PERFORMANCE STATUS: 1 - Symptomatic but completely ambulatory  Vitals:   01/15/20 0945  BP: 130/77  Pulse: 81  Resp: 17  Temp: 98 F (36.7 C)  SpO2: 99%   Filed Weights   01/15/20 0945  Weight: 295 lb 12.8 oz (134.2 kg)    BREAST: No palpable masses or nodules in either right or left breasts. No palpable axillary supraclavicular or infraclavicular adenopathy no breast tenderness or nipple discharge. (exam performed in the presence of a chaperone)  LABORATORY DATA:  I have reviewed the data as listed CMP Latest Ref Rng & Units 12/30/2019 08/23/2019 08/22/2019  Glucose 70 - 99 mg/dL 106(H) 93 111(H)  BUN 8 - 23  mg/dL 16 10 13   Creatinine 0.44 - 1.00 mg/dL 0.88 0.81 0.87  Sodium 135 - 145 mmol/L 138 142 138  Potassium 3.5 - 5.1 mmol/L 4.1 4.2 3.9  Chloride 98 - 111 mmol/L 103 105 102  CO2 22 - 32 mmol/L 25 27 27   Calcium 8.9 - 10.3 mg/dL 9.6 9.4 9.3  Total Protein 6.5 - 8.1 g/dL - 5.9(L) -  Total Bilirubin 0.3 - 1.2 mg/dL - 0.9 -  Alkaline Phos 38 - 126 U/L - 62 -  AST 15 - 41 U/L - 22 -  ALT 0 - 44 U/L - 41 -    Lab Results  Component Value Date   WBC 7.4 12/30/2019   HGB 14.5 12/30/2019   HCT 46.1 (H) 12/30/2019   MCV 78.4 (L) 12/30/2019   PLT 294 12/30/2019   NEUTROABS 4.8 12/30/2019    ASSESSMENT & PLAN:  Breast cancer of upper-outer quadrant of left female breast ([place]) Left breast invasive  ductal carcinoma status post lumpectomy 3.8 cm, 1/4 SLN positive grade 2, ER 99%, PR 100%, HER-2 negative ratio 0.74; T2 N1 A. M0 stage IIB Adjuvant chemotherapy Completed 4 cycles of dose dense Adriamycin and Cytoxan. Followed by Abraxane X 12 completed 08/18/14, status post radiation completed June 2016, started anastrozole 1 mg daily 11/20/2014 (also enrolled on [name] clinical trial randomized to Ibrance)completed 2 years9/08/2016  Breast Cancer Surveillance: 1. Breast exam:11/9/2020normal except for scar tissue related to prior surgery and radiation 2. Mammogram2/11/2019cegory B breast density. No mammographic abnormalities postsurgical changes.  Weight gain: She is struggling with weight issues.  Breast cancer surveillance: 1.  Breast exam 01/15/2020: Benign 2.  Mammograms: 04/09/2019: Benign breast density category B.  Hospitalization: 08/22/2019-08/23/2019 severe headache with right-sided visual disturbance: Stroke work-up negative, started on Plavix 75 mg daily  She has a 20 acres in [place] and is hoping to build a house and retire there at some point in the future. RTCone yearfor follow-up    No orders of the defined types were placed in this encounter.  The  patient has a good understanding of the overall plan. she agrees with it. she will call with any problems that may develop before the next visit here.  Total time spent: 20 mins including face to face time and time spent for planning, charting and coordination of care  Nicholas Lose, MD 01/15/2020  I, Cloyde Reams Dorshimer, am acting as scribe for Dr. Nicholas Lose.  I have reviewed the above documentation for accuracy and completeness, and I agree with the above.

## 2020-01-14 NOTE — Telephone Encounter (Signed)
[name]: appointment confirmation for 01/15/20 Call to patient to inform her that the study has an updated consent form AFT ICF Version 8, dated 07/09/2019, and asked her if she is available to arrive to clinic twenty minutes prior to her lab appointment. I informed the patient that the study would like [place] to re-consent participants at their next clinic visit, and tomorrow would be the day that fits this requirement for having her re-consented. Patient was informed that re-consent was required prior to her lab appointment and I provided her with a quick summary of what the updates are in this Version 8 ICF. Patient denied questions and confirmed availability to come early. I thanked patient for her time and flexibility.  Maxwell Marion, RN, BSN, [facility] Clinical Research 01/14/2020 2:54 PM

## 2020-01-15 ENCOUNTER — Encounter: Payer: Self-pay | Admitting: Medical Oncology

## 2020-01-15 ENCOUNTER — Inpatient Hospital Stay: Payer: 59 | Attending: Hematology and Oncology | Admitting: Hematology and Oncology

## 2020-01-15 ENCOUNTER — Inpatient Hospital Stay: Payer: 59

## 2020-01-15 ENCOUNTER — Other Ambulatory Visit: Payer: Self-pay

## 2020-01-15 DIAGNOSIS — C50412 Malignant neoplasm of upper-outer quadrant of left female breast: Secondary | ICD-10-CM

## 2020-01-15 DIAGNOSIS — Z791 Long term (current) use of non-steroidal anti-inflammatories (NSAID): Secondary | ICD-10-CM | POA: Diagnosis not present

## 2020-01-15 DIAGNOSIS — Z170 Estrogen receptor positive status [ER+]: Secondary | ICD-10-CM

## 2020-01-15 DIAGNOSIS — Z9221 Personal history of antineoplastic chemotherapy: Secondary | ICD-10-CM | POA: Insufficient documentation

## 2020-01-15 DIAGNOSIS — Z7951 Long term (current) use of inhaled steroids: Secondary | ICD-10-CM | POA: Diagnosis not present

## 2020-01-15 DIAGNOSIS — Z923 Personal history of irradiation: Secondary | ICD-10-CM | POA: Insufficient documentation

## 2020-01-15 DIAGNOSIS — Z79899 Other long term (current) drug therapy: Secondary | ICD-10-CM | POA: Insufficient documentation

## 2020-01-15 DIAGNOSIS — Z006 Encounter for examination for normal comparison and control in clinical research program: Secondary | ICD-10-CM | POA: Diagnosis not present

## 2020-01-15 DIAGNOSIS — Z79811 Long term (current) use of aromatase inhibitors: Secondary | ICD-10-CM | POA: Diagnosis not present

## 2020-01-15 LAB — RESEARCH LABS

## 2020-01-15 NOTE — Progress Notes (Signed)
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 Patient and I reviewed the consent form, page by page and all the changes/updates, which include new information regarding COVID-19 and the questionnaires, the End of Treatment Phase and Follow-up Phase, Other information that will be collected about her, and the blood sample collection with 7 and 10 years added. As we reviewed each page for new information, patient initialed and dated where indicated. Patient denied having any questions and proceeded to sign and date the consent form AFT ICF Version 8, dated 07/09/2019. Patient also consented to having her residual specimens kept for future research use. Patient was provided a copy of her signed Version 8 consent, as well as a letter from the [facility] regarding these updates and what to expect next.    Patient in clinic today for 60 month follow-up on study.  I met with patient this morning, here alone today. Patient confirms to be doing well. Patient also confirms that she continues to take her anastrozole daily with minimal side effects. Patient presents in very good spirits and excited about her new grandson. Patient's main complaint today is weight gain and the inability to lose weight despite several different attempts and Dr. Lindi Adie addressed these concerns with her today.  Patient had yearly mammogram in 03/2019, already reported.  Patient with no new anti-cancer prescriptions.  SAE: Patient had a non-study related hospital admission on  08/22/19 and discharged 08/23/19. Per admission note, admission for retinal artery occlusion (see discharge summary 08/23/19). Research Labs: time point research labs collected today.  H&P: Dr. Lindi Adie met with patient and addressed any questions and concerns she may have. Breast exam completed by MD today (see MD notes). Plan: Patient was informed that the next contact for study will be in one years time and it will be a phone call from the research department and that future follow-ups for study will be yearly phone calls through year 10. Patient gave verbal understanding and denied having any questions at this time. Patient was thanked for her continued support of study and was encouraged to call Dr. Lindi Adie or myself with any questions or concerns she may have. Adele Dan, RN, BSN, [facility] Clinical Research 01/15/2020 11:19 AM

## 2020-01-15 NOTE — Assessment & Plan Note (Signed)
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 M0 stage IIB Adjuvant chemotherapy Completed 4 cycles of dose dense Adriamycin and Cytoxan. Followed by Abraxane X 12 completed 08/18/14, status post radiation completed June 2016, started anastrozole 1 mg daily 11/20/2014 (also enrolled on [name] clinical trial randomized to Ibrance)completed 2 years9/08/2016  Breast Cancer Surveillance: 1. Breast exam:11/9/2020normal except for scar tissue related to prior surgery and radiation 2. Mammogram2/11/2019cegory B breast density. No mammographic abnormalities postsurgical changes.  Weight gain: I discussed with her about intermittent fasting. I encouraged her to exercise at least by walking and when the pulls are open she can go back to swimming.  Breast cancer surveillance: 1.  Breast exam 01/15/2020: Benign 2.  Mammograms: 04/09/2019: Benign breast density category B.  Hospitalization: 08/22/2019-08/23/2019 severe headache with right-sided visual disturbance: Stroke work-up negative, started on Plavix 75 mg daily  RTCone yearfor labs and follow-up

## 2020-01-16 ENCOUNTER — Telehealth: Payer: Self-pay | Admitting: Hematology and Oncology

## 2020-01-16 NOTE — Telephone Encounter (Signed)
Scheduled per 8/26 los. Pt will be given an updated appt calendar at next visit, per appt notes

## 2020-01-27 ENCOUNTER — Other Ambulatory Visit: Payer: Self-pay

## 2020-01-27 ENCOUNTER — Ambulatory Visit: Payer: 59

## 2020-01-27 DIAGNOSIS — R002 Palpitations: Secondary | ICD-10-CM

## 2020-01-27 DIAGNOSIS — H349 Unspecified retinal vascular occlusion: Secondary | ICD-10-CM

## 2020-01-30 ENCOUNTER — Telehealth: Payer: Self-pay

## 2020-02-04 NOTE — Telephone Encounter (Signed)
error 

## 2020-02-14 ENCOUNTER — Other Ambulatory Visit: Payer: Self-pay | Admitting: Family

## 2020-02-25 ENCOUNTER — Telehealth: Payer: Self-pay | Admitting: Cardiology

## 2020-02-25 NOTE — Telephone Encounter (Signed)
-----   Message from Adrian Prows, MD sent at 02/25/2020  7:19 AM EDT ----- Regarding: Event monitor Please check and acknowledge, she is not showing up in work flow.  Adrian Prows, MD, [facility] 02/25/2020, 7:20 AM Office: [phone]

## 2020-02-25 NOTE — Telephone Encounter (Signed)
monitor should be there now

## 2020-03-01 ENCOUNTER — Ambulatory Visit (INDEPENDENT_AMBULATORY_CARE_PROVIDER_SITE_OTHER): Payer: 59 | Admitting: Family

## 2020-03-01 ENCOUNTER — Other Ambulatory Visit: Payer: Self-pay

## 2020-03-01 ENCOUNTER — Telehealth: Payer: Self-pay

## 2020-03-01 VITALS — BP 160/86 | HR 72 | Temp 98.4°F | Resp 16 | Ht 68.0 in | Wt 298.4 lb

## 2020-03-01 DIAGNOSIS — R739 Hyperglycemia, unspecified: Secondary | ICD-10-CM

## 2020-03-01 DIAGNOSIS — I10 Essential (primary) hypertension: Secondary | ICD-10-CM | POA: Diagnosis not present

## 2020-03-01 DIAGNOSIS — N3281 Overactive bladder: Secondary | ICD-10-CM | POA: Diagnosis not present

## 2020-03-01 DIAGNOSIS — Z23 Encounter for immunization: Secondary | ICD-10-CM | POA: Diagnosis not present

## 2020-03-01 DIAGNOSIS — K219 Gastro-esophageal reflux disease without esophagitis: Secondary | ICD-10-CM

## 2020-03-01 DIAGNOSIS — E039 Hypothyroidism, unspecified: Secondary | ICD-10-CM | POA: Diagnosis not present

## 2020-03-01 DIAGNOSIS — B351 Tinea unguium: Secondary | ICD-10-CM

## 2020-03-01 DIAGNOSIS — J45909 Unspecified asthma, uncomplicated: Secondary | ICD-10-CM

## 2020-03-01 MED ORDER — PANTOPRAZOLE SODIUM 40 MG PO TBEC
40.0000 mg | DELAYED_RELEASE_TABLET | Freq: Every day | ORAL | 3 refills | Status: DC
Start: 2020-03-01 — End: 2020-06-28

## 2020-03-01 MED ORDER — LISINOPRIL 20 MG PO TABS: 35.0000 mg | ORAL_TABLET | Freq: Every day | ORAL | 1 refills | Status: DC

## 2020-03-01 MED ORDER — ALBUTEROL SULFATE HFA 108 (90 BASE) MCG/ACT IN AERS
2.0000 | INHALATION_SPRAY | Freq: Four times a day (QID) | RESPIRATORY_TRACT | 5 refills | Status: DC | PRN
Start: 2020-03-01 — End: 2020-07-26

## 2020-03-01 NOTE — Patient Instructions (Addendum)
Please complete lab work prior to leaving. Increase lisinopril from 20mg  to 30mg  once daily.

## 2020-03-01 NOTE — Telephone Encounter (Signed)
Patient was seen this morning.

## 2020-03-01 NOTE — Telephone Encounter (Signed)
Has asthma and went on vacation and let her inhaler. Needs inhaler called in, is wheezing.  Guidelines Guideline Title Affirmed Question Affirmed Notes Nurse Date/Time Eilene Ghazi Time) Breathing Difficulty [1] MILD difficulty breathing (e.g., minimal/ no SOB at rest, SOB with walking, pulse <100) AND [2] NEW-onset or WORSE than normal Hassel Neth 02/28/2020 8:30:11 AM Disp. Time Eilene Ghazi Time) Disposition Final User 02/28/2020 8:26:25 AM Send to Urgent Queue Donato Heinz 02/28/2020 8:33:16 AM See HCP within 4 Hours (or PCP triage) Yes Jimmye Norman, RN, Ricka Burdock Disagree/Comply Comply Caller Understands Yes PreDisposition Call Doctor Care Advice Given Per Guideline SEE HCP (OR PCP TRIAGE) WITHIN 4 HOURS: * IF OFFICE WILL BE OPEN: You need to be seen within the next 3 or 4 hours. Call your doctor (or NP/PA) now or as soon as the office opens. CALL BACK IF: * You become worse CARE ADVICE given per Breathing Difficulty (Adult) guideline. * IF OFFICE WILL BE CLOSED AND PCP SECOND-LEVEL TRIAGE REQUIRED: You may need to be seen. Your doctor (or NP/PA) will want to talk with you to decide what's best. I'll page the oncall provider now. If you haven't heard from the provider (or me) within 30 minutes, call again. NOTE: If on-call provider can't be reached, send to [facility] or ED. PLEASE NOTE: All timestamps contained within this report are represented as [place] Standard Time. CONFIDENTIALTY NOTICE: This fax transmission is intended only for the addressee. It contains information that is legally privileged, confidential or otherwise protected from use or disclosure. If you are not the intended recipient, you are strictly prohibited from reviewing, disclosing, copying using or disseminating any of this information or taking any action in reliance on or regarding this information. If you have received this fax in error, please notify us immediately by telephone so that we can arrange for its  return to [place]. Phone: [phone], Toll-Free: [phone], Fax: [phone] Page: 2 of 2 Call Id: [id number] Comments User: Tivis Ringer, RN Date/Time Eilene Ghazi Time): 02/28/2020 8:37:07 AM There is no provider on call, caller instructed to go to urgent care Referrals [place] UNDECIDED

## 2020-03-01 NOTE — Progress Notes (Signed)
Subjective:    Patient ID: Michelle Kennedy, female    DOB: 04-Oct-1955, 64 y.o.   MRN: [id number]  HPI  Patient is a 64 yr old female who presents today for follow up.  OAB- maintained on myrbetriq.  She reports that this is very helpful for her.   HTN- maintained on lisinopril 84m.  BP Readings from Last 3 Encounters:  03/01/20 (!) 160/86  01/15/20 130/77  01/12/20 (!) 154/83   Asthma- maintained on wixela 250-50 bid. Using nebulizer unit 2-3 times a day. Helps for a little while.    Hypothyroid- maintained on synthroid 200 mcg.  Lab Results  Component Value Date   TSH 0.41 12/09/2019   Hyperlipidemia- maintained on atorvastatin.    Lab Results  Component Value Date   CHOL 174 08/23/2019   HDL 48 08/23/2019   LDLCALC 102 (H) 08/23/2019   LDLDIRECT 117.0 02/26/2019   TRIG 121 08/23/2019   CHOLHDL 3.6 08/23/2019   Onychomycosis- reports that this is resolved following lamisil treatment.   Lab Results  Component Value Date   HGBA1C 6.1 (H) 08/23/2019   HTN- on lisinopril 214monce daily.  BP Readings from Last 3 Encounters:  03/01/20 (!) 160/86  01/15/20 130/77  01/12/20 (!) 154/83    Review of Systems See HPI  Past Medical History:  Diagnosis Date  . Abnormally small mouth   . Achilles tendon disorder, right   . Anemia   . Anxiety   . Arthritis    hips and knees  . Asthma    daily inhaler, prn inhaler and neb.  . Asthma due to environmental allergies   . [place]   [place]  . [place]  . Chest pain   . COPD (chronic obstructive pulmonary disease) ([facility]  . Dental bridge present    upper front and lower right  . Dental crowns present    x 3  . Depression   . Esophageal spasm    reports since her chemo and breast surgery  she developed esophageal spasms and reports this is in the past has casue her 02 to desat in the  70s , denies sycnope in relation to this , does report hx of vertigo as well   . Family history of  anesthesia complication    twin brother aspirated and died on OR table, per pt.  . Fibromyalgia   . Gallbladder disease   . GERD (gastroesophageal reflux disease)   . H/O blood clots    had blood to  PICC line   . History of gastric ulcer    as a teenager  . History of seizure age 64 as a reaction to Penicillin - no seizures since  . History of stomach ulcers   . History of thyroid cancer    s/p thyroidectomy  . Hyperlipidemia   . Hypothyroidism   . Joint pain   . Left knee injury   . Liver disorder    seen when she had her hysterectomy , reports she was told it was  " small lesion" ; but asymptomatic   . Lymphedema    left arm  . Migraines   . Multiple food allergies   . Obesity   . OSA (obstructive sleep apnea) 02/16/2016  . Palpitations    reports no longer experiences  . Personal history of chemotherapy 2015  . Personal history of radiation therapy 2015   Left  . Prediabetes   . Rheumatoid arthritis (  HCC)   . SOB (shortness of breath)   . Swelling of both ankles   . Tinnitus   . UTI (lower urinary tract infection) 02/17/2014  . Vertigo    last episode  was 2 weeks ago   . Vitamin D deficiency   . Wears contact lenses    left eye only   . Wears hearing aid in both ears      Social History   Socioeconomic History  . Marital status: Married    Spouse name: Not on file  . Number of children: Not on file  . Years of education: Not on file  . Highest education level: Not on file  Occupational History  . 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 Smoking status: Never Smoker  . Smokeless tobacco: Never Used  Vaping Use  . Vaping Use: Never used  Substance and Sexual Activity  . Alcohol use: No    Alcohol/week: 0.0 standard drinks  . Drug use: No  . Sexual activity: Not Currently    Partners: Male    Comment: menarche age 34, P 2, first birth age 72, menopause age 40, Premarin x 10 yrs  Other Topics Concern  . Not on file  Social  History Narrative   Regular exercise: yes   Right handed    Lives with husband one story home.   Social Determinants of Health   Financial Resource Strain:   . Difficulty of Paying Living Expenses: Not on file  Food Insecurity:   . Worried About Charity fundraiser in the Last Year: Not on file  . Ran Out of Food in the Last Year: Not on file  Transportation Needs:   . Lack of Transportation (Medical): Not on file  . Lack of Transportation (Non-Medical): Not on file  Physical Activity:   . Days of Exercise per Week: Not on file  . Minutes of Exercise per Session: Not on file  Stress:   . Feeling of Stress : Not on file  Social Connections:   . Frequency of Communication with Friends and Family: Not on file  . Frequency of Social Gatherings with Friends and Family: Not on file  . Attends Religious Services: Not on file  . Active Member of Clubs or Organizations: Not on file  . Attends Archivist Meetings: Not on file  . Marital Status: Not on file  Intimate Partner Violence:   . Fear of Current or Ex-Partner: Not on file  . Emotionally Abused: Not on file  . Physically Abused: Not on file  . Sexually Abused: Not on file    Past Surgical History:  Procedure Laterality Date  . ABDOMINAL HYSTERECTOMY  2004   complete  . ACHILLES TENDON REPAIR Right   . APPENDECTOMY  2004  . BREAST EXCISIONAL BIOPSY    . BREAST LUMPECTOMY Left    2015  . BREAST LUMPECTOMY WITH NEEDLE LOCALIZATION AND AXILLARY SENTINEL LYMPH NODE BX Left 02/23/2014   Procedure: BREAST LUMPECTOMY WITH NEEDLE LOCALIZATION AND AXILLARY SENTINEL LYMPH NODE BIOPSY;  Surgeon: Excell Seltzer, MD;  Location: [facility];  Service: General;  Laterality: Left;  . BREAST SURGERY  2011   left breast biposy  . CHOLECYSTECTOMY  1990  . COLONOSCOPY W/ POLYPECTOMY  06/2009  . EYE SURGERY Right 2013   exc. warts from underneath eyelid  . INCONTINENCE SURGERY  2004  . KNEE ARTHROSCOPY Bilateral      x 6 each knee  .  LIGAMENT REPAIR Right    thumb/wrist  . ORIF TOE FRACTURE Right    great toe  . PORTACATH PLACEMENT N/A 03/24/2014   Procedure: INSERTION PORT-A-CATH;  Surgeon: Excell Seltzer, MD;  Location: [place];  Service: General;  Laterality: N/A;; removed   . THYROIDECTOMY  2000  . TONSILLECTOMY AND ADENOIDECTOMY  2000  . TOTAL KNEE ARTHROPLASTY Left 12/03/2017   Procedure: LEFT TOTAL KNEE ARTHROPLASTY;  Surgeon: Gaynelle Arabian, MD;  Location: [facility];  Service: Orthopedics;  Laterality: Left;  . TUMOR EXCISION     from thoracic spine    Family History  Problem Relation Age of Onset  . Alcohol abuse Mother   . Arthritis Mother   . Hypertension Mother   . Bipolar disorder Mother   . Breast cancer Mother 87       unconfirmed  . Lung cancer Mother 54       smoker  . Hyperlipidemia Mother   . Stroke Mother   . Cancer Mother   . Depression Mother   . Anxiety disorder Mother   . Alcoholism Mother   . Drug abuse Mother   . Eating disorder Mother   . Obesity Mother   . Alcohol abuse Father   . Cancer Father        lung  . Hyperlipidemia Father   . Kidney disease Father   . Diabetes Father   . Hypertension Father   . Cystic kidney disease Father   . Thyroid disease Father   . Liver disease Father   . Alcoholism Father   . Arthritis Maternal Grandmother   . Diabetes Paternal Grandmother   . Thyroid cancer Sister 82       type?; currently 4  . Other Sister        ovarian tumor @ 43; TAH/BSO  . Breast cancer Sister   . Thyroid cancer Brother        dx 61s; currently 17  . Breast cancer Maternal Aunt        dx 31s; deceased 38  . Thyroid cancer Paternal Aunt        All 3 paternal aunts with thyroid ca in 30s/40s  . Lung cancer Paternal Aunt        2 of 3 paternal aunts with lung cancer    Allergies  Allergen Reactions  . Bee Venom Anaphylaxis  . Contrast Media [Iodinated Diagnostic Agents] Shortness Of Breath  . Iodine Other (See  Comments)    CARDIAC ARREST  . Latex Anaphylaxis and Rash  . Penicillins Shortness Of Breath, Rash and Other (See Comments)    SEIZURE Did it involve swelling of the face/tongue/throat, SOB, or low BP? no Did it involve sudden or severe rash/hives, skin peeling, or any reaction on the inside of your mouth or nose? yes Did you need to seek medical attention at a hospital or doctor's office? yes When did it last happen?2010 If all above answers are "NO", may proceed with cephalosporin use.   . Shellfish Allergy Shortness Of Breath and Rash  . Aspirin Rash and Other (See Comments)    GI UPSET  . Erythromycin Swelling and Rash    SWELLING OF JOINTS  . Lidocaine Swelling    SWELLING OF MOUTH AND THROAT  . Symbicort [Budesonide-Formoterol Fumarate] Other (See Comments)    BURNING OF TONGUE AND LIPS  . Betadine [Povidone Iodine]     Rash. Breathing problems.   . Codeine Rash  . Compazine [Prochlorperazine Maleate] Rash  Rash on face,chest, arms, back  . Pentazocine Lactate Rash  . Sulfonamide Derivatives Rash    Current Outpatient Medications on File Prior to Visit  Medication Sig Dispense Refill  . albuterol (PROVENTIL) (5 MG/ML) 0.5% nebulizer solution Take 0.5 mLs (2.5 mg total) by nebulization every 6 (six) hours as needed for wheezing or shortness of breath. 40 mL 3  . ALPHAGAN P 0.1 % SOLN Place 1 drop into both eyes in the morning and at bedtime.     Marland Kitchen anastrozole (ARIMIDEX) 1 MG tablet Take 1 tablet (1 mg total) by mouth daily. 90 tablet 3  . atorvastatin (LIPITOR) 40 MG tablet Take 1 tablet (40 mg total) by mouth daily. 90 tablet 1  . Blood Pressure KIT Check blood sugar as directed 1 kit 0  . CALCIUM PO Take 1 tablet by mouth every evening.    . cholecalciferol (VITAMIN D3) 25 MCG (1000 UNIT) tablet Take 1,000 Units by mouth every evening.     . clopidogrel (PLAVIX) 75 MG tablet Take 1 tablet (75 mg total) by mouth daily. 30 tablet 3  . EPINEPHrine 0.3 mg/0.3 mL IJ  SOAJ injection Inject 0.3 mLs (0.3 mg total) into the muscle once. (Patient taking differently: Inject 0.3 mg into the muscle as needed for anaphylaxis. ) 2 Device 0  . fluticasone (FLONASE) 50 MCG/ACT nasal spray Place 2 sprays into both nostrils daily as needed for allergies.    . Fluticasone-Salmeterol (WIXELA INHUB) 250-50 MCG/DOSE AEPB Inhale 1 puff into the lungs 2 (two) times daily. 180 each 3  . furosemide (LASIX) 20 MG tablet TAKE 1 TABLET(20 MG) BY MOUTH DAILY AS NEEDED FOR SWELLING 30 tablet 3  . GEMTESA 75 MG TABS Take 1 tablet by mouth daily.    Marland Kitchen levothyroxine (SYNTHROID) 200 MCG tablet TAKE 1 TABLET BY MOUTH  DAILY BEFORE BREAKFAST 16 tablet 0  . lisinopril (ZESTRIL) 20 MG tablet Take 1 tablet (20 mg total) by mouth daily. 90 tablet 1  . meloxicam (MOBIC) 7.5 MG tablet TAKE 1 TABLET BY MOUTH  DAILY 90 tablet 1  . methocarbamol (ROBAXIN) 500 MG tablet Take 1 tablet (500 mg total) by mouth every 8 (eight) hours as needed for muscle spasms. 60 tablet 1  . Multiple Vitamin (MULTIVITAMIN ADULT) TABS Take 1 tablet by mouth daily.    . Multiple Vitamins-Minerals (OCUVITE PO) Take 1 tablet by mouth daily.    Marland Kitchen MYRBETRIQ 50 MG TB24 tablet TAKE 1 TABLET BY MOUTH  DAILY 90 tablet 1  . Omega-3 Fatty Acids (FISH OIL) 1000 MG CAPS Take 1 capsule by mouth every evening.     . traMADol (ULTRAM) 50 MG tablet Take 1 tablet (50 mg total) by mouth every 6 (six) hours as needed. 30 tablet 0  . [DISCONTINUED] meloxicam (MOBIC) 7.5 MG tablet TAKE 1 TABLET BY MOUTH  DAILY 90 tablet 1   No current facility-administered medications on file prior to visit.    BP (!) 160/86 (BP Location: Right Arm, Patient Position: Sitting, Cuff Size: Large)   Pulse 72   Temp 98.4 F (36.9 C) (Oral)   Resp 16   Ht 5' 8"  (1.727 m)   Wt 298 lb 6.4 oz (135.4 kg)   SpO2 100%   BMI 45.37 kg/m       Objective:   Physical Exam Constitutional:      Appearance: She is well-developed.  Neck:     Thyroid: No  thyromegaly.  Cardiovascular:     Rate and Rhythm:  Normal rate and regular rhythm.     Heart sounds: Normal heart sounds. No murmur heard.   Pulmonary:     Effort: Pulmonary effort is normal. No respiratory distress.     Breath sounds: Normal breath sounds. No wheezing.  Musculoskeletal:     Cervical back: Neck supple.  Skin:    General: Skin is warm and dry.  Neurological:     Mental Status: She is alert and oriented to person, place, and time.  Psychiatric:        Behavior: Behavior normal.        Thought Content: Thought content normal.        Judgment: Judgment normal.           Assessment & Plan:  HTN- uncontrolled.  Will increase lisinopril from 45m to 325monce daily.  OAB- stable on myrbetriq 5016mnce daily.   Asthma- fair control. Continue Wixela and prn albuterol.  Hypothyroid/hx of thyroid CA- continues to follow with endo and continues synthroid.  GERD- stable on protonix 41m29m  Hyperlipidemia- LDL at goal on lipitor 41mg62me daily. Continue.   Onychomycosis- reports resolution.    This visit occurred during the SARS-CoV-2 public health emergency.  Safety protocols were in place, including screening questions prior to the visit, additional usage of staff PPE, and extensive cleaning of exam room while observing appropriate contact time as indicated for disinfecting solutions.

## 2020-03-02 LAB — HEMOGLOBIN A1C
Hgb A1c MFr Bld: 5.8 % of total Hgb — ABNORMAL HIGH (ref <5.7)
Mean Plasma Glucose: 120 (calc)
eAG (mmol/L): 6.6 (calc)

## 2020-03-02 LAB — BASIC METABOLIC PANEL
BUN: 14 mg/dL (ref 7–25)
CO2: 27 mmol/L (ref 20–32)
Calcium: 9.8 mg/dL (ref 8.6–10.4)
Chloride: 105 mmol/L (ref 98–110)
Creat: 0.88 mg/dL (ref 0.50–0.99)
Glucose, Bld: 98 mg/dL (ref 65–99)
Potassium: 4.3 mmol/L (ref 3.5–5.3)
Sodium: 141 mmol/L (ref 135–146)

## 2020-03-02 LAB — TSH: TSH: 0.65 mIU/L (ref 0.40–4.50)

## 2020-03-15 ENCOUNTER — Encounter: Payer: Self-pay | Admitting: Family

## 2020-03-15 ENCOUNTER — Ambulatory Visit (INDEPENDENT_AMBULATORY_CARE_PROVIDER_SITE_OTHER): Payer: 59 | Admitting: Family

## 2020-03-15 ENCOUNTER — Other Ambulatory Visit: Payer: Self-pay

## 2020-03-15 VITALS — BP 154/77 | HR 66 | Temp 98.4°F | Resp 16 | Ht 68.0 in | Wt 296.4 lb

## 2020-03-15 DIAGNOSIS — I10 Essential (primary) hypertension: Secondary | ICD-10-CM

## 2020-03-15 MED ORDER — AMLODIPINE BESYLATE 5 MG PO TABS
5.0000 mg | ORAL_TABLET | Freq: Every day | ORAL | 3 refills | Status: DC
Start: 2020-03-15 — End: 2020-04-07

## 2020-03-15 NOTE — Patient Instructions (Signed)
Please complete lab work prior to leaving. Add amlodipine 5mg  once daily.

## 2020-03-15 NOTE — Progress Notes (Signed)
Subjective:    Patient ID: Michelle Kennedy, female    DOB: Jul 25, 1955, 64 y.o.   MRN: [id number]  HPI  Patient is a 64 yr old female who presents today for follow up of her hypertension.  Last visit we increased her lisinopril from 20mg  to 30mg .   BP Readings from Last 3 Encounters:  03/15/20 (!) 154/77  03/01/20 (!) 160/86  01/15/20 130/77    Review of Systems    see HPI  Past Medical History:  Diagnosis Date  . Abnormally small mouth   . Achilles tendon disorder, right   . Anemia   . Anxiety   . Arthritis    hips and knees  . Asthma    daily inhaler, prn inhaler and neb.  . Asthma due to environmental allergies   . Breast cancer ([place]) 01/2014   left  . Cancer ([place])   . Chest pain   . COPD (chronic obstructive pulmonary disease) ([place])   . Dental bridge present    upper front and lower right  . Dental crowns present    x 3  . Depression   . Esophageal spasm    reports since her chemo and breast surgery  she developed esophageal spasms and reports this is in the past has casue her 02 to desat in the  70s , denies sycnope in relation to this , does report hx of vertigo as well   . Family history of anesthesia complication    twin brother aspirated and died on OR table, per pt.  . Fibromyalgia   . Gallbladder disease   . GERD (gastroesophageal reflux disease)   . H/O blood clots    had blood to  PICC line   . History of gastric ulcer    as a teenager  . History of seizure age 23   as a reaction to Penicillin - no seizures since  . History of stomach ulcers   . History of thyroid cancer    s/p thyroidectomy  . Hyperlipidemia   . Hypothyroidism   . Joint pain   . Left knee injury   . Liver disorder    seen when she had her hysterectomy , reports she was told it was  " small lesion" ; but asymptomatic   . Lymphedema    left arm  . Migraines   . Multiple food allergies   . Obesity   . OSA (obstructive sleep apnea) 02/16/2016  . Palpitations    reports  no longer experiences  . Personal history of chemotherapy 2015  . Personal history of radiation therapy 2015   Left  . Prediabetes   . Rheumatoid arthritis ([place])   . SOB (shortness of breath)   . Swelling of both ankles   . Tinnitus   . UTI (lower urinary tract infection) 02/17/2014  . Vertigo    last episode  was 2 weeks ago   . Vitamin D deficiency   . Wears contact lenses    left eye only   . Wears hearing aid in both ears      Social History   Socioeconomic History  . Marital status: Married    Spouse name: Not on file  . Number of children: Not on file  . Years of education: Not on file  . Highest education level: Not on file  Occupational History  . 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 Smoking status: Never Smoker  . Smokeless tobacco:  Never Used  Vaping Use  . Vaping Use: Never used  Substance and Sexual Activity  . Alcohol use: No    Alcohol/week: 0.0 standard drinks  . Drug use: No  . Sexual activity: Not Currently    Partners: Male    Comment: menarche age 29, P 2, first birth age 51, menopause age 89, Premarin x 10 yrs  Other Topics Concern  . Not on file  Social History Narrative   Regular exercise: yes   Right handed    Lives with husband one story home.   Social Determinants of Health   Financial Resource Strain:   . Difficulty of Paying Living Expenses: Not on file  Food Insecurity:   . Worried About Charity fundraiser in the Last Year: Not on file  . Ran Out of Food in the Last Year: Not on file  Transportation Needs:   . Lack of Transportation (Medical): Not on file  . Lack of Transportation (Non-Medical): Not on file  Physical Activity:   . Days of Exercise per Week: Not on file  . Minutes of Exercise per Session: Not on file  Stress:   . Feeling of Stress : Not on file  Social Connections:   . Frequency of Communication with Friends and Family: Not on file  . Frequency of Social Gatherings with Friends and  Family: Not on file  . Attends Religious Services: Not on file  . Active Member of Clubs or Organizations: Not on file  . Attends Archivist Meetings: Not on file  . Marital Status: Not on file  Intimate Partner Violence:   . Fear of Current or Ex-Partner: Not on file  . Emotionally Abused: Not on file  . Physically Abused: Not on file  . Sexually Abused: Not on file    Past Surgical History:  Procedure Laterality Date  . ABDOMINAL HYSTERECTOMY  2004   complete  . ACHILLES TENDON REPAIR Right   . APPENDECTOMY  2004  . BREAST EXCISIONAL BIOPSY    . BREAST LUMPECTOMY Left    2015  . BREAST LUMPECTOMY WITH NEEDLE LOCALIZATION AND AXILLARY SENTINEL LYMPH NODE BX Left 02/23/2014   Procedure: BREAST LUMPECTOMY WITH NEEDLE LOCALIZATION AND AXILLARY SENTINEL LYMPH NODE BIOPSY;  Surgeon: Excell Seltzer, MD;  Location: [facility];  Service: General;  Laterality: Left;  . BREAST SURGERY  2011   left breast biposy  . CHOLECYSTECTOMY  1990  . COLONOSCOPY W/ POLYPECTOMY  06/2009  . EYE SURGERY Right 2013   exc. warts from underneath eyelid  . INCONTINENCE SURGERY  2004  . KNEE ARTHROSCOPY Bilateral    x 6 each knee  . LIGAMENT REPAIR Right    thumb/wrist  . ORIF TOE FRACTURE Right    great toe  . PORTACATH PLACEMENT N/A 03/24/2014   Procedure: INSERTION PORT-A-CATH;  Surgeon: Excell Seltzer, MD;  Location: [facility];  Service: General;  Laterality: N/A;; removed   . THYROIDECTOMY  2000  . TONSILLECTOMY AND ADENOIDECTOMY  2000  . TOTAL KNEE ARTHROPLASTY Left 12/03/2017   Procedure: LEFT TOTAL KNEE ARTHROPLASTY;  Surgeon: Gaynelle Arabian, MD;  Location[facility];  Service: Orthopedics;  Laterality: Left;  . TUMOR EXCISION     from thoracic spine    Family History  Problem Relation Age of Onset  . Alcohol abuse Mother   . Arthritis Mother   . Hypertension Mother   . Bipolar disorder Mother   . Breast cancer Mother 26  unconfirmed  .  Lung cancer Mother 80       smoker  . Hyperlipidemia Mother   . Stroke Mother   . Cancer Mother   . Depression Mother   . Anxiety disorder Mother   . Alcoholism Mother   . Drug abuse Mother   . Eating disorder Mother   . Obesity Mother   . Alcohol abuse Father   . Cancer Father        lung  . Hyperlipidemia Father   . Kidney disease Father   . Diabetes Father   . Hypertension Father   . Cystic kidney disease Father   . Thyroid disease Father   . Liver disease Father   . Alcoholism Father   . Arthritis Maternal Grandmother   . Diabetes Paternal Grandmother   . Thyroid cancer Sister 39       type?; currently 74  . Other Sister        ovarian tumor @ 56; TAH/BSO  . Breast cancer Sister   . Thyroid cancer Brother        dx 54s; currently 75  . Breast cancer Maternal Aunt        dx 24s; deceased 27  . Thyroid cancer Paternal Aunt        All 3 paternal aunts with thyroid ca in 30s/40s  . Lung cancer Paternal Aunt        2 of 3 paternal aunts with lung cancer    Allergies  Allergen Reactions  . Bee Venom Anaphylaxis  . Contrast Media [Iodinated Diagnostic Agents] Shortness Of Breath  . Iodine Other (See Comments)    CARDIAC ARREST  . Latex Anaphylaxis and Rash  . Penicillins Shortness Of Breath, Rash and Other (See Comments)    SEIZURE Did it involve swelling of the face/tongue/throat, SOB, or low BP? no Did it involve sudden or severe rash/hives, skin peeling, or any reaction on the inside of your mouth or nose? yes Did you need to seek medical attention at a hospital or doctor's office? yes When did it last happen?2010 If all above answers are "NO", may proceed with cephalosporin use.   . Shellfish Allergy Shortness Of Breath and Rash  . Aspirin Rash and Other (See Comments)    GI UPSET  . Erythromycin Swelling and Rash    SWELLING OF JOINTS  . Lidocaine Swelling    SWELLING OF MOUTH AND THROAT  . Symbicort [Budesonide-Formoterol Fumarate] Other (See  Comments)    BURNING OF TONGUE AND LIPS  . Betadine [Povidone Iodine]     Rash. Breathing problems.   . Codeine Rash  . Compazine [Prochlorperazine Maleate] Rash    Rash on face,chest, arms, back  . Pentazocine Lactate Rash  . Sulfonamide Derivatives Rash    Current Outpatient Medications on File Prior to Visit  Medication Sig Dispense Refill  . albuterol (PROVENTIL) (5 MG/ML) 0.5% nebulizer solution Take 0.5 mLs (2.5 mg total) by nebulization every 6 (six) hours as needed for wheezing or shortness of breath. 40 mL 3  . albuterol (VENTOLIN HFA) 108 (90 Base) MCG/ACT inhaler Inhale 2 puffs into the lungs every 6 (six) hours as needed for wheezing or shortness of breath. 18 g 5  . ALPHAGAN P 0.1 % SOLN Place 1 drop into both eyes in the morning and at bedtime.     Marland Kitchen anastrozole (ARIMIDEX) 1 MG tablet Take 1 tablet (1 mg total) by mouth daily. 90 tablet 3  . atorvastatin (LIPITOR) 40 MG tablet  Take 1 tablet (40 mg total) by mouth daily. 90 tablet 1  . CALCIUM PO Take 1 tablet by mouth every evening.    . cholecalciferol (VITAMIN D3) 25 MCG (1000 UNIT) tablet Take 1,000 Units by mouth every evening.     . clopidogrel (PLAVIX) 75 MG tablet Take 1 tablet (75 mg total) by mouth daily. 30 tablet 3  . Fluticasone-Salmeterol (WIXELA INHUB) 250-50 MCG/DOSE AEPB Inhale 1 puff into the lungs 2 (two) times daily. 180 each 3  . furosemide (LASIX) 20 MG tablet TAKE 1 TABLET(20 MG) BY MOUTH DAILY AS NEEDED FOR SWELLING 30 tablet 3  . GEMTESA 75 MG TABS Take 1 tablet by mouth daily.    Marland Kitchen levothyroxine (SYNTHROID) 200 MCG tablet TAKE 1 TABLET BY MOUTH  DAILY BEFORE BREAKFAST 16 tablet 0  . lisinopril (ZESTRIL) 20 MG tablet Take 2 tablets (40 mg total) by mouth daily. 135 tablet 1  . methocarbamol (ROBAXIN) 500 MG tablet Take 1 tablet (500 mg total) by mouth every 8 (eight) hours as needed for muscle spasms. 60 tablet 1  . Multiple Vitamin (MULTIVITAMIN ADULT) TABS Take 1 tablet by mouth daily.    .  Multiple Vitamins-Minerals (OCUVITE PO) Take 1 tablet by mouth daily.    Marland Kitchen MYRBETRIQ 50 MG TB24 tablet TAKE 1 TABLET BY MOUTH  DAILY 90 tablet 1  . Omega-3 Fatty Acids (FISH OIL) 1000 MG CAPS Take 1 capsule by mouth every evening.     . pantoprazole (PROTONIX) 40 MG tablet Take 1 tablet (40 mg total) by mouth daily. 30 tablet 3  . traMADol (ULTRAM) 50 MG tablet Take 1 tablet (50 mg total) by mouth every 6 (six) hours as needed. 30 tablet 0  . [DISCONTINUED] meloxicam (MOBIC) 7.5 MG tablet TAKE 1 TABLET BY MOUTH  DAILY 90 tablet 1   No current facility-administered medications on file prior to visit.    BP (!) 154/77 (BP Location: Right Arm, Cuff Size: Large)   Pulse 66   Temp 98.4 F (36.9 C) (Oral)   Resp 16   Ht 5\' 8"  (1.727 m)   Wt 296 lb 6.4 oz (134.4 kg)   SpO2 99%   BMI 45.07 kg/m    Objective:   Physical Exam Constitutional:      Appearance: She is well-developed.  Cardiovascular:     Rate and Rhythm: Normal rate and regular rhythm.     Heart sounds: Normal heart sounds. No murmur heard.   Pulmonary:     Effort: Pulmonary effort is normal. No respiratory distress.     Breath sounds: Normal breath sounds. No wheezing.  Psychiatric:        Behavior: Behavior normal.        Thought Content: Thought content normal.        Judgment: Judgment normal.           Assessment & Plan:  HTN- BP remains above goal but improving. Continue lisinopril 30mg  and will add amlodipine 5mg  once daily.   This visit occurred during the SARS-CoV-2 public health emergency.  Safety protocols were in place, including screening questions prior to the visit, additional usage of staff PPE, and extensive cleaning of exam room while observing appropriate contact time as indicated for disinfecting solutions.

## 2020-03-16 LAB — BASIC METABOLIC PANEL
BUN: 13 mg/dL (ref 7–25)
CO2: 29 mmol/L (ref 20–32)
Calcium: 10.2 mg/dL (ref 8.6–10.4)
Chloride: 104 mmol/L (ref 98–110)
Creat: 0.87 mg/dL (ref 0.50–0.99)
Glucose, Bld: 110 mg/dL — ABNORMAL HIGH (ref 65–99)
Potassium: 4.6 mmol/L (ref 3.5–5.3)
Sodium: 141 mmol/L (ref 135–146)

## 2020-03-21 ENCOUNTER — Other Ambulatory Visit: Payer: Self-pay | Admitting: Family

## 2020-03-22 ENCOUNTER — Ambulatory Visit (INDEPENDENT_AMBULATORY_CARE_PROVIDER_SITE_OTHER): Payer: 59 | Admitting: Family

## 2020-03-22 ENCOUNTER — Other Ambulatory Visit: Payer: Self-pay

## 2020-03-22 VITALS — BP 139/61 | HR 70 | Temp 98.7°F | Resp 16 | Wt 294.0 lb

## 2020-03-22 DIAGNOSIS — Z23 Encounter for immunization: Secondary | ICD-10-CM | POA: Diagnosis not present

## 2020-03-22 DIAGNOSIS — I10 Essential (primary) hypertension: Secondary | ICD-10-CM | POA: Diagnosis not present

## 2020-03-22 NOTE — Patient Instructions (Signed)
Try to keep your calories < 1500/day

## 2020-03-22 NOTE — Progress Notes (Signed)
Subjective:    Patient ID: Michelle Kennedy, female    DOB: [birth year], 64 y.o.   MRN: [id number]  HPI  Patient is a 64 year old female who presents today for follow-up of her hypertension.  She was last seen on October 25.  At that time, her blood pressure was noted to be above goal and amlodipine 5 mg was added to her regimen of lisinopril 30 mg once daily. She reports tolerating amlodipine without side effects.  Denies LE edema.  She is concerned about her weight and her hyperglycemia.   BP Readings from Last 3 Encounters:  03/22/20 139/61  03/15/20 (!) 154/77  03/01/20 (!) 160/86     Review of Systems See HPI  Past Medical History:  Diagnosis Date   Abnormally small mouth    Achilles tendon disorder, right    Anemia    Anxiety    Arthritis    hips and knees   Asthma    daily inhaler, prn inhaler and neb.   Asthma due to environmental allergies    Breast cancer ([place]) 01/2014   left   Cancer [facility])    Chest pain    COPD (chronic obstructive pulmonary disease) ([facility])    Dental bridge present    upper front and lower right   Dental crowns present    x 3   Depression    Esophageal spasm    reports since her chemo and breast surgery  she developed esophageal spasms and reports this is in the past has casue her 02 to desat in the  70s , denies sycnope in relation to this , does report hx of vertigo as well    Family history of anesthesia complication    twin brother aspirated and died on OR table, per pt.   Fibromyalgia    Gallbladder disease    GERD (gastroesophageal reflux disease)    H/O blood clots    had blood to  PICC line    History of gastric ulcer    as a teenager   History of seizure age 45   as a reaction to Penicillin - no seizures since   History of stomach ulcers    History of thyroid cancer    s/p thyroidectomy   Hyperlipidemia    Hypothyroidism    Joint pain    Left knee injury    Liver disorder    seen when she  had her hysterectomy , reports she was told it was  " small lesion" ; but asymptomatic    Lymphedema    left arm   Migraines    Multiple food allergies    Obesity    OSA (obstructive sleep apnea) 02/16/2016   Palpitations    reports no longer experiences   Personal history of chemotherapy 2015   Personal history of radiation therapy 2015   Left   Prediabetes    Rheumatoid arthritis ([facility])    SOB (shortness of breath)    Swelling of both ankles    Tinnitus    UTI (lower urinary tract infection) 02/17/2014   Vertigo    last episode  was 2 weeks ago    Vitamin D deficiency    Wears contact lenses    left eye only    Wears hearing aid in both ears      Social History   Socioeconomic History   Marital status: Married    Spouse name: Not on file   Number of children: Not on file  Years of education: Not on file   Highest education level: Not on file  Occupational History   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   Social Determinants of Health   Financial Resource Strain:    Difficulty of Paying Living Expenses: Not on file  Food Insecurity:    Worried About Charity fundraiser in the Last Year: Not on file   [employer] of Food in the Last Year: Not on file  Transportation Needs:    Lack of Transportation (Medical): Not on file   Lack of Transportation (Non-Medical): Not on file  Physical Activity:    Days of Exercise per Week: Not  on file   Minutes of Exercise per Session: Not on file  Stress:    Feeling of Stress : Not on file  Social Connections:    Frequency of Communication with Friends and Family: Not on file   Frequency of Social Gatherings with Friends and Family: Not on file   Attends Religious Services: Not on file   Active Member of Clubs or Organizations: Not on file   Attends Archivist Meetings: Not on file   Marital Status: Not on file  Intimate Partner Violence:    Fear of Current or Ex-Partner: Not on file   Emotionally Abused: Not on file   Physically Abused: Not on file   Sexually Abused: Not on file    Past Surgical History:  Procedure Laterality Date   ABDOMINAL HYSTERECTOMY  2004   complete   ACHILLES TENDON REPAIR Right    APPENDECTOMY  2004   BREAST EXCISIONAL BIOPSY     BREAST LUMPECTOMY Left    2015   BREAST LUMPECTOMY WITH NEEDLE LOCALIZATION AND AXILLARY SENTINEL LYMPH NODE BX Left 02/23/2014   Procedure: BREAST LUMPECTOMY WITH NEEDLE LOCALIZATION AND AXILLARY SENTINEL LYMPH NODE BIOPSY;  Surgeon: Excell Seltzer, MD;  Location: [facility];  Service: General;  Laterality: Left;   BREAST SURGERY  2011   left breast biposy   CHOLECYSTECTOMY  1990   COLONOSCOPY W/ POLYPECTOMY  06/2009   EYE SURGERY Right 2013   exc. warts from underneath eyelid   INCONTINENCE SURGERY  2004   KNEE ARTHROSCOPY Bilateral    x 6 each knee   LIGAMENT REPAIR Right    thumb/wrist   ORIF TOE FRACTURE Right    great toe   PORTACATH PLACEMENT N/A 03/24/2014   Procedure: INSERTION PORT-A-CATH;  Surgeon: Excell Seltzer, MD;  Location: [facility];  Service: General;  Laterality: N/A;; removed    THYROIDECTOMY  2000   TONSILLECTOMY AND ADENOIDECTOMY  2000   TOTAL KNEE ARTHROPLASTY Left 12/03/2017   Procedure: LEFT TOTAL KNEE ARTHROPLASTY;  Surgeon: Gaynelle Arabian, MD;  [facility];  Service: Orthopedics;  Laterality: Left;    TUMOR EXCISION     from thoracic spine  Family History  Problem Relation Age of Onset   Alcohol abuse Mother    Arthritis Mother    Hypertension Mother    Bipolar disorder Mother    Breast cancer Mother 5       unconfirmed   Lung cancer Mother 31       smoker   Hyperlipidemia Mother    Stroke Mother    Cancer Mother    Depression Mother    Anxiety disorder Mother    Alcoholism Mother    Drug abuse Mother    Eating disorder Mother    Obesity Mother    Alcohol abuse Father    Cancer Father        lung   Hyperlipidemia Father    Kidney disease Father    Diabetes Father    Hypertension Father    Cystic kidney disease Father    Thyroid disease Father    Liver disease Father    Alcoholism Father    Arthritis Maternal Grandmother    Diabetes Paternal Grandmother    Thyroid cancer Sister 50       type?; currently 66   Other Sister        ovarian tumor @ 60; TAH/BSO   Breast cancer Sister    Thyroid cancer Brother        dx 61s; currently 8   Breast cancer Maternal Aunt        dx 53s; deceased 59   Thyroid cancer Paternal 32        All 3 paternal aunts with thyroid ca in 30s/40s   Lung cancer Paternal Aunt        2 of 3 paternal aunts with lung cancer    Allergies  Allergen Reactions   Bee Venom Anaphylaxis   Contrast Media [Iodinated Diagnostic Agents] Shortness Of Breath   Iodine Other (See Comments)    CARDIAC ARREST   Latex Anaphylaxis and Rash   Penicillins Shortness Of Breath, Rash and Other (See Comments)    SEIZURE Did it involve swelling of the face/tongue/throat, SOB, or low BP? no Did it involve sudden or severe rash/hives, skin peeling, or any reaction on the inside of your mouth or nose? yes Did you need to seek medical attention at a hospital or doctor's office? yes When did it last happen?2010 If all above answers are NO, may proceed with cephalosporin use.    Shellfish Allergy Shortness Of  Breath and Rash   Aspirin Rash and Other (See Comments)    GI UPSET   Erythromycin Swelling and Rash    SWELLING OF JOINTS   Lidocaine Swelling    SWELLING OF MOUTH AND THROAT   Symbicort [Budesonide-Formoterol Fumarate] Other (See Comments)    BURNING OF TONGUE AND LIPS   Betadine [Povidone Iodine]     Rash. Breathing problems.    Codeine Rash   Compazine [Prochlorperazine Maleate] Rash    Rash on face,chest, arms, back   Pentazocine Lactate Rash   Sulfonamide Derivatives Rash    Current Outpatient Medications on File Prior to Visit  Medication Sig Dispense Refill   albuterol (PROVENTIL) (5 MG/ML) 0.5% nebulizer solution Take 0.5 mLs (2.5 mg total) by nebulization every 6 (six) hours as needed for wheezing or shortness of breath. 40 mL 3   albuterol (VENTOLIN HFA) 108 (90 Base) MCG/ACT inhaler Inhale 2 puffs into the lungs every 6 (six) hours as needed for wheezing or shortness of breath. 18 g 5   ALPHAGAN P 0.1 % SOLN  Place 1 drop into both eyes in the morning and at bedtime.      amLODipine (NORVASC) 5 MG tablet Take 1 tablet (5 mg total) by mouth daily. 30 tablet 3   anastrozole (ARIMIDEX) 1 MG tablet Take 1 tablet (1 mg total) by mouth daily. 90 tablet 3   atorvastatin (LIPITOR) 40 MG tablet Take 1 tablet (40 mg total) by mouth daily. 90 tablet 1   CALCIUM PO Take 1 tablet by mouth every evening.     cholecalciferol (VITAMIN D3) 25 MCG (1000 UNIT) tablet Take 1,000 Units by mouth every evening.      clopidogrel (PLAVIX) 75 MG tablet Take 1 tablet (75 mg total) by mouth daily. 30 tablet 3   Fluticasone-Salmeterol (WIXELA INHUB) 250-50 MCG/DOSE AEPB Inhale 1 puff into the lungs 2 (two) times daily. 180 each 3   furosemide (LASIX) 20 MG tablet TAKE 1 TABLET(20 MG) BY MOUTH DAILY AS NEEDED FOR SWELLING 30 tablet 3   GEMTESA 75 MG TABS Take 1 tablet by mouth daily.     levothyroxine (SYNTHROID) 200 MCG tablet TAKE 1 TABLET BY MOUTH  DAILY BEFORE BREAKFAST 16  tablet 0   lisinopril (ZESTRIL) 20 MG tablet Take 2 tablets (40 mg total) by mouth daily. 135 tablet 1   methocarbamol (ROBAXIN) 500 MG tablet Take 1 tablet (500 mg total) by mouth every 8 (eight) hours as needed for muscle spasms. 60 tablet 1   Multiple Vitamin (MULTIVITAMIN ADULT) TABS Take 1 tablet by mouth daily.     Multiple Vitamins-Minerals (OCUVITE PO) Take 1 tablet by mouth daily.     MYRBETRIQ 50 MG TB24 tablet TAKE 1 TABLET BY MOUTH  DAILY 90 tablet 1   Omega-3 Fatty Acids (FISH OIL) 1000 MG CAPS Take 1 capsule by mouth every evening.      pantoprazole (PROTONIX) 40 MG tablet Take 1 tablet (40 mg total) by mouth daily. 30 tablet 3   traMADol (ULTRAM) 50 MG tablet Take 1 tablet (50 mg total) by mouth every 6 (six) hours as needed. 30 tablet 0   [DISCONTINUED] meloxicam (MOBIC) 7.5 MG tablet TAKE 1 TABLET BY MOUTH  DAILY 90 tablet 1   No current facility-administered medications on file prior to visit.    BP 139/61 (BP Location: Right Arm, Patient Position: Sitting, Cuff Size: Large)    Pulse 70    Temp 98.7 F (37.1 C) (Oral)    Resp 16    Wt 294 lb (133.4 kg)    SpO2 97%    BMI 44.70 kg/m       Objective:   Physical Exam Constitutional:      Appearance: Normal appearance. She is not diaphoretic.  Pulmonary:     Effort: Pulmonary effort is normal.  Neurological:     Mental Status: She is alert and oriented to person, place, and time.           Assessment & Plan:   HTN- bp stable/improved. Continue lisinopril 30mg  and amlodipine 5mg .  Lab Results  Component Value Date   HGBA1C 5.8 (H) 03/01/2020   Morbid obesity- I encouraged her to begin counting calories and to try to keep calories <1500/day.   Wt Readings from Last 3 Encounters:  03/22/20 294 lb (133.4 kg)  03/15/20 296 lb 6.4 oz (134.4 kg)  03/01/20 298 lb 6.4 oz (135.4 kg)   Shingrix #1 today.   This visit occurred during the SARS-CoV-2 public health emergency.  Safety protocols were in place,  including screening  questions prior to the visit, additional usage of staff PPE, and extensive cleaning of exam room while observing appropriate contact time as indicated for disinfecting solutions.

## 2020-03-30 NOTE — Progress Notes (Signed)
Patient Care Team: Debbrah Alar, NP as PCP - General (Internal Medicine) Excell Seltzer, MD (Inactive) as Consulting Physician (General Surgery) Nicholas Lose, MD as Consulting Physician (Hematology and Oncology) [name], MD as Attending Physician (Radiation Oncology) Katheran James., MD (Endocrinology) Maxwell Marion, RN as Registered Nurse (Medical Oncology) Renelda Loma, OD as Consulting Physician (Optometry)  DIAGNOSIS:    ICD-10-CM   1. Malignant neoplasm of upper-outer quadrant of left breast in female, estrogen receptor positive ([place])  C50.412    Z17.0     SUMMARY OF ONCOLOGIC HISTORY: Oncology History  Breast cancer of upper-outer quadrant of left female breast ([place])  12/22/2013 Mammogram   Left breast upper-outer quadrant 1.8 x 1.4 x 1.6 cm mass with left axillary lymph node measuring 3.6 mm   12/30/2013 Breast MRI   Dominant enhancing left upper outer quadrant irregular mass encompassing a confluent area of abnormal clumped nodular enhancement, 5.5 cm overall, 6 mm masslike enhancement central left breast     02/23/2014 Surgery   Left lumpectomy: Invasive ductal carcinoma grade 2 spending 3.8 cm intermediate grade DCIS with lymphovascular invasion margins negative one out of 4 SLN positive, ER 99%, PR 100%, HER-2 negative ratio 0.74, T2, N1, M0 stage IIB   03/19/2014 PET scan   No evidence of distant metastatic disease   03/31/2014 - 08/18/2014 Chemotherapy   Adjuvant chemotherapy with dose dense Adriamycin Cytoxan x4 followed by Abraxane weekly x12   08/28/2014 Imaging   Left brachial vein thrombus   09/11/2014 -  Radiation Therapy   Adjuvant radiation therapy   11/20/2014 -  Anti-estrogen oral therapy   Anastrozole 1 mg daily + Ibrance ([name] clinical trial)     CHIEF COMPLIANT: Follow-up of left breast cancer on anastrozole, new lump in the left breast  INTERVAL HISTORY: Michelle Kennedy is a 64 y.o. with above-mentioned history of  left breast cancer who underwentalumpectomy, adjuvant chemotherapy,radiation, and is currently on anastrozole.She participated in the[place] clinical trial and took 2 years of Ibrance along with anastrozole.   She felt a new lump in the left breast and it appears to be slightly getting worse over the past 2 months.  She came in today for an evaluation.  She is accompanied by her husband.  ALLERGIES:  is allergic to bee venom, contrast media [iodinated diagnostic agents], iodine, latex, penicillins, shellfish allergy, aspirin, erythromycin, lidocaine, symbicort [budesonide-formoterol fumarate], betadine [povidone iodine], codeine, compazine [prochlorperazine maleate], pentazocine lactate, and sulfonamide derivatives.  MEDICATIONS:  Current Outpatient Medications  Medication Sig Dispense Refill  . albuterol (PROVENTIL) (5 MG/ML) 0.5% nebulizer solution Take 0.5 mLs (2.5 mg total) by nebulization every 6 (six) hours as needed for wheezing or shortness of breath. 40 mL 3  . albuterol (VENTOLIN HFA) 108 (90 Base) MCG/ACT inhaler Inhale 2 puffs into the lungs every 6 (six) hours as needed for wheezing or shortness of breath. 18 g 5  . ALPHAGAN P 0.1 % SOLN Place 1 drop into both eyes in the morning and at bedtime.     Marland Kitchen amLODipine (NORVASC) 5 MG tablet Take 1 tablet (5 mg total) by mouth daily. 30 tablet 3  . anastrozole (ARIMIDEX) 1 MG tablet Take 1 tablet (1 mg total) by mouth daily. 90 tablet 3  . atorvastatin (LIPITOR) 40 MG tablet Take 1 tablet (40 mg total) by mouth daily. 90 tablet 1  . CALCIUM PO Take 1 tablet by mouth every evening.    . cholecalciferol (VITAMIN D3) 25 MCG (1000 UNIT)  tablet Take 1,000 Units by mouth every evening.     . clopidogrel (PLAVIX) 75 MG tablet Take 1 tablet (75 mg total) by mouth daily. 30 tablet 3  . Fluticasone-Salmeterol (WIXELA INHUB) 250-50 MCG/DOSE AEPB Inhale 1 puff into the lungs 2 (two) times daily. 180 each 3  . furosemide (LASIX) 20 MG tablet TAKE 1  TABLET(20 MG) BY MOUTH DAILY AS NEEDED FOR SWELLING 30 tablet 3  . GEMTESA 75 MG TABS Take 1 tablet by mouth daily.    Marland Kitchen levothyroxine (SYNTHROID) 200 MCG tablet TAKE 1 TABLET BY MOUTH  DAILY BEFORE BREAKFAST 90 tablet 1  . lisinopril (ZESTRIL) 20 MG tablet Take 2 tablets (40 mg total) by mouth daily. 135 tablet 1  . methocarbamol (ROBAXIN) 500 MG tablet Take 1 tablet (500 mg total) by mouth every 8 (eight) hours as needed for muscle spasms. 60 tablet 1  . Multiple Vitamin (MULTIVITAMIN ADULT) TABS Take 1 tablet by mouth daily.    . Multiple Vitamins-Minerals (OCUVITE PO) Take 1 tablet by mouth daily.    Marland Kitchen MYRBETRIQ 50 MG TB24 tablet TAKE 1 TABLET BY MOUTH  DAILY 90 tablet 1  . Omega-3 Fatty Acids (FISH OIL) 1000 MG CAPS Take 1 capsule by mouth every evening.     . pantoprazole (PROTONIX) 40 MG tablet Take 1 tablet (40 mg total) by mouth daily. 30 tablet 3  . traMADol (ULTRAM) 50 MG tablet Take 1 tablet (50 mg total) by mouth every 6 (six) hours as needed. 30 tablet 0   No current facility-administered medications for this visit.    PHYSICAL EXAMINATION: ECOG PERFORMANCE STATUS: 1 - Symptomatic but completely ambulatory  Vitals:   03/31/20 1506  BP: 133/85  Pulse: 83  Resp: 18  Temp: 98.2 F (36.8 C)  SpO2: 97%   Filed Weights   03/31/20 1506  Weight: 296 lb 8 oz (134.5 kg)    BREAST:   (exam performed in the presence of a chaperone)  LABORATORY DATA:  I have reviewed the data as listed CMP Latest Ref Rng & Units 03/15/2020 03/01/2020 12/30/2019  Glucose 65 - 99 mg/dL 110(H) 98 106(H)  BUN 7 - 25 mg/dL _0 Creatinine 0.50 - 0.99 mg/dL 0.87 0.88 0.88  Sodium 135 - 146 mmol/L 141 141 138  Potassium 3.5 - 5.3 mmol/L 4.6 4.3 4.1  Chloride 98 - 110 mmol/L 104 105 103  CO2 20 - 32 mmol/L _1 Calcium 8.6 - 10.4 mg/dL 10.2 9.8 9.6  Total Protein 6.5 - 8.1 g/dL - - -  Total Bilirubin 0.3 - 1.2 mg/dL - - -  Alkaline Phos 38 - 126 U/L - - -  AST 15 - 41 U/L - - -  ALT  0 - 44 U/L - - -    Lab Results  Component Value Date   WBC 7.4 12/30/2019   HGB 14.5 12/30/2019   HCT 46.1 (H) 12/30/2019   MCV 78.4 (L) 12/30/2019   PLT 294 12/30/2019   NEUTROABS 4.8 12/30/2019    ASSESSMENT & PLAN:  Breast cancer of upper-outer quadrant of left female breast ([place]) Left breast invasive ductal carcinoma status post lumpectomy 3.8 cm, 1/4 SLN positive grade 2, ER 99%, PR 100%, HER-2 negative ratio 0.74; T2 N1 A. M0 stage IIB Adjuvant chemotherapy Completed 4 cycles of dose dense Adriamycin and Cytoxan. Followed by Abraxane X 12 completed 08/18/14, status post radiation completed June 2016, started anastrozole 1 mg daily 11/20/2014 (also enrolled on [name]  clinical trial randomized to Ibrance)completed 2 years9/08/2016  Breast Cancer Surveillance: 1. Breast exam:11/9/2020normal except for scar tissue related to prior surgery and radiation 2. Mammogram2/11/2019cegory B breast density. No mammographic abnormalities postsurgical changes.  Weight gain: She is struggling with weight issues.  Breast cancer surveillance: 1.Breast exam 01/15/2020: Palpable lump in the left breast upper outer quadrant: It is underneath the scar tissue near the axillary fold.  We will need to obtain an immediate ultrasound mammogram and a biopsy if necessary. She does not want to wait for her annual mammograms which are due after 04/08/2020.  She wants to get this checked out as soon as possible.  She plans to go out of town on 04/10/2020.  She is hoping that we can have this test done prior to that. 2.Mammograms: 04/09/2019: Benign breast density category B.  Hospitalization: 08/22/2019-08/23/2019 severe headache with right-sided visual disturbance: Stroke work-up negative, started on Plavix 75 mg daily  She has a 20 acres in [place] and is hoping to build a house and retire there at some point in the future. RTCone yearfor follow-up    No orders of the defined types  were placed in this encounter.  The patient has a good understanding of the overall plan. she agrees with it. she will call with any problems that may develop before the next visit here.  Total time spent: 20 mins including face to face time and time spent for planning, charting and coordination of care  Nicholas Lose, MD 03/31/2020  I, Cloyde Reams Dorshimer, am acting as scribe for Dr. Nicholas Lose.  I have reviewed the above documentation for accuracy and completeness, and I agree with the above.

## 2020-03-31 ENCOUNTER — Inpatient Hospital Stay: Payer: 59 | Attending: Hematology and Oncology | Admitting: Hematology and Oncology

## 2020-03-31 ENCOUNTER — Other Ambulatory Visit: Payer: Self-pay

## 2020-03-31 DIAGNOSIS — C50412 Malignant neoplasm of upper-outer quadrant of left female breast: Secondary | ICD-10-CM | POA: Insufficient documentation

## 2020-03-31 DIAGNOSIS — Z79899 Other long term (current) drug therapy: Secondary | ICD-10-CM | POA: Diagnosis not present

## 2020-03-31 DIAGNOSIS — Z79811 Long term (current) use of aromatase inhibitors: Secondary | ICD-10-CM | POA: Diagnosis not present

## 2020-03-31 DIAGNOSIS — Z923 Personal history of irradiation: Secondary | ICD-10-CM | POA: Diagnosis not present

## 2020-03-31 DIAGNOSIS — Z7951 Long term (current) use of inhaled steroids: Secondary | ICD-10-CM | POA: Diagnosis not present

## 2020-03-31 DIAGNOSIS — Z9221 Personal history of antineoplastic chemotherapy: Secondary | ICD-10-CM | POA: Insufficient documentation

## 2020-03-31 DIAGNOSIS — Z7902 Long term (current) use of antithrombotics/antiplatelets: Secondary | ICD-10-CM | POA: Diagnosis not present

## 2020-03-31 DIAGNOSIS — Z170 Estrogen receptor positive status [ER+]: Secondary | ICD-10-CM | POA: Diagnosis not present

## 2020-03-31 NOTE — Assessment & Plan Note (Signed)
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 M0 stage IIB Adjuvant chemotherapy Completed 4 cycles of dose dense Adriamycin and Cytoxan. Followed by Abraxane X 12 completed 08/18/14, status post radiation completed June 2016, started anastrozole 1 mg daily 11/20/2014 (also enrolled on [name] clinical trial randomized to Ibrance)completed 2 years9/08/2016  Breast Cancer Surveillance: 1. Breast exam:11/9/2020normal except for scar tissue related to prior surgery and radiation 2. Mammogram2/11/2019cegory B breast density. No mammographic abnormalities postsurgical changes.  Weight gain: She is struggling with weight issues.  Breast cancer surveillance: 1.Breast exam 01/15/2020: Benign 2.Mammograms: 04/09/2019: Benign breast density category B.  Hospitalization: 08/22/2019-08/23/2019 severe headache with right-sided visual disturbance: Stroke work-up negative, started on Plavix 75 mg daily  She has a 20 acres in [place] and is hoping to build a house and retire there at some point in the future. RTCone yearfor follow-up 

## 2020-04-05 ENCOUNTER — Ambulatory Visit
Admission: RE | Admit: 2020-04-05 | Discharge: 2020-04-05 | Disposition: A | Discharge status: Home / Self Care | Payer: 59 | Source: Ambulatory Visit | Attending: Hematology and Oncology | Admitting: Hematology and Oncology

## 2020-04-05 ENCOUNTER — Telehealth: Payer: Self-pay | Admitting: Hematology and Oncology

## 2020-04-05 ENCOUNTER — Other Ambulatory Visit: Payer: Self-pay

## 2020-04-05 DIAGNOSIS — C50412 Malignant neoplasm of upper-outer quadrant of left female breast: Secondary | ICD-10-CM

## 2020-04-05 DIAGNOSIS — Z170 Estrogen receptor positive status [ER+]: Secondary | ICD-10-CM

## 2020-04-05 NOTE — Telephone Encounter (Signed)
No 11/10 los, no changes made to pt schedule  

## 2020-04-07 ENCOUNTER — Other Ambulatory Visit: Payer: Self-pay

## 2020-04-07 MED ORDER — AMLODIPINE BESYLATE 5 MG PO TABS: 5.0000 mg | ORAL_TABLET | Freq: Every day | ORAL | 1 refills | Status: DC

## 2020-04-18 ENCOUNTER — Other Ambulatory Visit: Payer: Self-pay | Admitting: Family

## 2020-05-02 ENCOUNTER — Other Ambulatory Visit: Payer: Self-pay | Admitting: Hematology and Oncology

## 2020-05-18 NOTE — Progress Notes (Signed)
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  UPDATE: Current medications:  Plavix 75mg  daily, atorvastatin 40mg  daily, amlodipine, furosemide, lisinopril  13 day cardiac event monitor from 01/27/2020 through 02/10/2020 showed predominantly NSR with 8 SVT episodes 6 to 10 beats and rare PACs and PVCs but no atrial fibrillation.  03/01/2020 Hgb A1c 5.8.  Her vision has reportedly gotten worse in the right eye.  She has required 4 new eyeglass prescriptions.  She was told that underlying glaucoma may be contributing.  She reports only mild headaches.   HISTORY: She was admitted to [facility] on 08/22/2019 for right branch retinal artery occlusion.  She presented with 1 week of vertical stacked double vision only in the right eye.  She had a severe headache  CT head showed no acute intracranial abnormality.  MRI of brain was negative for acute or subacute infarction.  She was unable to have CTA due to iodine allergy, but MRA of head showed no large vessel occlusion or high-grade stenosis.  Carotid doppler showed no hemodynamically significant stenosis.  2D echocardiogram EF 55-60%.  Interatrial septum was not well visualized.  Covid testing was negative.  LDL was 102, Hgb A1c 6.1.  She was discharged on Plavix 75mg  daily (she has ASA allergy), continued on Zestril and Lipitor increased from 20mg  to 40mg  daily.  Visual symptoms persist.  On 09/24/2019, she developed blurred vision and then double vision.  She presented to the ED for further evaluation.  Blood pressure was 153/79.  She endorsed double vision even when closing one eye.  Given monocular double vision, intrinsic eye etiology suspected.   PAST MEDICAL HISTORY: Past Medical History:  Diagnosis  Date  . Abnormally small mouth   . Achilles tendon disorder, right   . Anemia   . Anxiety   . Arthritis    hips and knees  . Asthma    daily inhaler, prn inhaler and neb.  . Asthma due to environmental allergies   . Breast cancer ([place]) 01/2014   left  . Cancer ([place])   . Chest pain   . COPD (chronic obstructive pulmonary disease) ([place])   . Dental bridge present    upper front and lower right  . Dental crowns present    x 3  . Depression   . Esophageal spasm    reports since her chemo and breast surgery  she developed esophageal spasms and reports this is in the past has casue her 02 to desat in the  70s , denies sycnope in relation to this , does report hx of vertigo as well   . Family history of anesthesia complication    twin brother aspirated and died on OR table, per pt.  . Fibromyalgia   . Gallbladder disease   . GERD (gastroesophageal reflux disease)   . H/O blood clots    had blood to  PICC line   . History of gastric ulcer    as a teenager  . History of seizure age 45   as a reaction to Penicillin - no seizures since  . History of stomach ulcers   . History of thyroid cancer    s/p thyroidectomy  . Hyperlipidemia   . Hypothyroidism   . Joint pain   .  Left knee injury   . Liver disorder    seen when she had her hysterectomy , reports she was told it was  " small lesion" ; but asymptomatic   . Lymphedema    left arm  . Migraines   . Multiple food allergies   . Obesity   . OSA (obstructive sleep apnea) 02/16/2016  . Palpitations    reports no longer experiences  . Personal history of chemotherapy 2015  . Personal history of radiation therapy 2015   Left  . Prediabetes   . Rheumatoid arthritis ([place])   . SOB (shortness of breath)   . Swelling of both ankles   . Tinnitus   . UTI (lower urinary tract infection) 02/17/2014  . Vertigo    last episode  was 2 weeks ago   . Vitamin D deficiency   . Wears contact lenses    left eye only   . Wears hearing aid in  both ears     MEDICATIONS: Current Outpatient Medications on File Prior to Visit  Medication Sig Dispense Refill  . albuterol (PROVENTIL) (5 MG/ML) 0.5% nebulizer solution Take 0.5 mLs (2.5 mg total) by nebulization every 6 (six) hours as needed for wheezing or shortness of breath. 40 mL 3  . albuterol (VENTOLIN HFA) 108 (90 Base) MCG/ACT inhaler Inhale 2 puffs into the lungs every 6 (six) hours as needed for wheezing or shortness of breath. 18 g 5  . ALPHAGAN P 0.1 % SOLN Place 1 drop into both eyes in the morning and at bedtime.     Marland Kitchen amLODipine (NORVASC) 5 MG tablet Take 1 tablet (5 mg total) by mouth daily. 90 tablet 1  . anastrozole (ARIMIDEX) 1 MG tablet TAKE 1 TABLET BY MOUTH  DAILY 90 tablet 0  . atorvastatin (LIPITOR) 40 MG tablet Take 1 tablet (40 mg total) by mouth daily. 90 tablet 1  . CALCIUM PO Take 1 tablet by mouth every evening.    . cholecalciferol (VITAMIN D3) 25 MCG (1000 UNIT) tablet Take 1,000 Units by mouth every evening.     . clopidogrel (PLAVIX) 75 MG tablet Take 1 tablet (75 mg total) by mouth daily. 90 tablet 3  . Fluticasone-Salmeterol (WIXELA INHUB) 250-50 MCG/DOSE AEPB Inhale 1 puff into the lungs 2 (two) times daily. 180 each 3  . furosemide (LASIX) 20 MG tablet TAKE 1 TABLET(20 MG) BY MOUTH DAILY AS NEEDED FOR SWELLING 30 tablet 3  . GEMTESA 75 MG TABS Take 1 tablet by mouth daily.    Marland Kitchen levothyroxine (SYNTHROID) 200 MCG tablet TAKE 1 TABLET BY MOUTH  DAILY BEFORE BREAKFAST 90 tablet 1  . lisinopril (ZESTRIL) 20 MG tablet Take 2 tablets (40 mg total) by mouth daily. 135 tablet 1  . methocarbamol (ROBAXIN) 500 MG tablet Take 1 tablet (500 mg total) by mouth every 8 (eight) hours as needed for muscle spasms. 60 tablet 1  . Multiple Vitamin (MULTIVITAMIN ADULT) TABS Take 1 tablet by mouth daily.    . Multiple Vitamins-Minerals (OCUVITE PO) Take 1 tablet by mouth daily.    Marland Kitchen MYRBETRIQ 50 MG TB24 tablet TAKE 1 TABLET BY MOUTH  DAILY 90 tablet 1  . Omega-3 Fatty  Acids (FISH OIL) 1000 MG CAPS Take 1 capsule by mouth every evening.     . pantoprazole (PROTONIX) 40 MG tablet Take 1 tablet (40 mg total) by mouth daily. 30 tablet 3  . traMADol (ULTRAM) 50 MG tablet Take 1 tablet (50 mg total) by mouth every 6 (  six) hours as needed. 30 tablet 0  . [DISCONTINUED] meloxicam (MOBIC) 7.5 MG tablet TAKE 1 TABLET BY MOUTH  DAILY 90 tablet 1   No current facility-administered medications on file prior to visit.    ALLERGIES: Allergies  Allergen Reactions  . Bee Venom Anaphylaxis  . Contrast Media [Iodinated Diagnostic Agents] Shortness Of Breath  . Iodine Other (See Comments)    CARDIAC ARREST  . Latex Anaphylaxis and Rash  . Penicillins Shortness Of Breath, Rash and Other (See Comments)    SEIZURE Did it involve swelling of the face/tongue/throat, SOB, or low BP? no Did it involve sudden or severe rash/hives, skin peeling, or any reaction on the inside of your mouth or nose? yes Did you need to seek medical attention at a hospital or doctor's office? yes When did it last happen?2010 If all above answers are "NO", may proceed with cephalosporin use.   . Shellfish Allergy Shortness Of Breath and Rash  . Aspirin Rash and Other (See Comments)    GI UPSET  . Erythromycin Swelling and Rash    SWELLING OF JOINTS  . Lidocaine Swelling    SWELLING OF MOUTH AND THROAT  . Symbicort [Budesonide-Formoterol Fumarate] Other (See Comments)    BURNING OF TONGUE AND LIPS  . Betadine [Povidone Iodine]     Rash. Breathing problems.   . Codeine Rash  . Compazine [Prochlorperazine Maleate] Rash    Rash on face,chest, arms, back  . Pentazocine Lactate Rash  . Sulfonamide Derivatives Rash    FAMILY HISTORY: Family History  Problem Relation Age of Onset  . Alcohol abuse Mother   . Arthritis Mother   . Hypertension Mother   . Bipolar disorder Mother   . Breast cancer Mother 68       unconfirmed  . Lung cancer Mother 57       smoker  . Hyperlipidemia  Mother   . Stroke Mother   . Cancer Mother   . Depression Mother   . Anxiety disorder Mother   . Alcoholism Mother   . Drug abuse Mother   . Eating disorder Mother   . Obesity Mother   . Alcohol abuse Father   . Cancer Father        lung  . Hyperlipidemia Father   . Kidney disease Father   . Diabetes Father   . Hypertension Father   . Cystic kidney disease Father   . Thyroid disease Father   . Liver disease Father   . Alcoholism Father   . Arthritis Maternal Grandmother   . Diabetes Paternal Grandmother   . Thyroid cancer Sister 32       type?; currently 41  . Other Sister        ovarian tumor @ 51; TAH/BSO  . Breast cancer Sister   . Thyroid cancer Brother        dx 43s; currently 34  . Breast cancer Maternal Aunt        dx 69s; deceased 19  . Thyroid cancer Paternal Aunt        All 3 paternal aunts with thyroid ca in 30s/40s  . Lung cancer Paternal Aunt        2 of 3 paternal aunts with lung cancer    SOCIAL HISTORY: Social History   Socioeconomic History  . Marital status: Married    Spouse name: Not on file  . Number of children: Not on file  . Years of education: Not on file  . Highest education  level: Not on file  Occupational History  . 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 Smoking status: Never Smoker  . Smokeless tobacco: Never Used  Vaping Use  . Vaping Use: Never used  Substance and Sexual Activity  . Alcohol use: No    Alcohol/week: 0.0 standard drinks  . Drug use: No  . Sexual activity: Not Currently    Partners: Male    Comment: menarche age 59, P 2, first birth age 65, menopause age 60, Premarin x 10 yrs  Other Topics Concern  . Not on file  Social History Narrative   Regular exercise: yes   Right handed    Lives with husband one story home.   Social Determinants of Health   Financial Resource Strain: Not on file  Food Insecurity: Not on file  Transportation Needs: Not on file  Physical Activity: Not on  file  Stress: Not on file  Social Connections: Not on file  Intimate Partner Violence: Not on file     Objective:  Blood pressure (!) 147/80, pulse 86, height 5\' 7"  (1.702 m), weight 295 lb 12.8 oz (134.2 kg), SpO2 94 %. General: No acute distress.  Patient appears well-groomed.   Head:  Normocephalic/atraumatic Eyes:  Fundi examined but not visualized Neck: supple, no paraspinal tenderness, full range of motion Heart:  Regular rate and rhythm Lungs:  Clear to auscultation bilaterally Back: No paraspinal tenderness Neurological Exam: alert and oriented to person, place, and time. Attention span and concentration intact, recent and remote memory intact, fund of knowledge intact.  Speech fluent and not dysarthric, language intact.  CN II-XII intact. Bulk and tone normal, muscle strength 5/5 throughout.  Sensation to light touch intact.  Deep tendon reflexes 2+ throughout, toes downgoing.  Finger to nose and heel to shin testing intact.  Gait normal, Romberg negative.   Assessment/Plan:   1.  Right retinal artery occlusion, cryptogenic.  She reports that her vision has progressively gotten worse.  Initial onset was associated with severe headache.  Now with only mild headache but would evaluate for possible temporal arteritis. 2.  Hypertension 3.  Hyperlipidemia  1.  Management as per PCP: -  Plavix 75mg  daily -  Atorvastatin 40mg  daily (LDL goal less than 70) -  Glycemic control (Hgb A1c goal less than 7) -  Blood pressure control 2.  Check sed rate. If significantly elevated, would refer for temporal artery biopsy 3.  Follow up in 6 months.  , DO  CC: , NP

## 2020-05-20 ENCOUNTER — Other Ambulatory Visit: Payer: Self-pay

## 2020-05-20 ENCOUNTER — Encounter: Payer: Self-pay | Admitting: Neurology

## 2020-05-20 ENCOUNTER — Other Ambulatory Visit: Payer: 59

## 2020-05-20 ENCOUNTER — Ambulatory Visit (INDEPENDENT_AMBULATORY_CARE_PROVIDER_SITE_OTHER): Payer: 59 | Admitting: Neurology

## 2020-05-20 VITALS — BP 147/80 | HR 86 | Ht 67.0 in | Wt 295.8 lb

## 2020-05-20 DIAGNOSIS — H349 Unspecified retinal vascular occlusion: Secondary | ICD-10-CM

## 2020-05-20 DIAGNOSIS — E785 Hyperlipidemia, unspecified: Secondary | ICD-10-CM

## 2020-05-20 DIAGNOSIS — I10 Essential (primary) hypertension: Secondary | ICD-10-CM | POA: Diagnosis not present

## 2020-05-20 LAB — SEDIMENTATION RATE: Sed Rate: 12 mm/hr (ref 0–30)

## 2020-05-20 NOTE — Patient Instructions (Addendum)
1. Continue current management 2.  Will check a sed rate. Your provider has requested that you have labwork completed today. Please go to [facility] ([id number]) on the second floor of this building before leaving the office today. You do not need to check in. If you are not called within 15 minutes please check with the front desk.  3.  Follow up in 6 months.

## 2020-05-22 HISTORY — PX: OTHER SURGICAL HISTORY: SHX169

## 2020-05-24 NOTE — Progress Notes (Signed)
LMOVM for pt to call office in regards to lab results.

## 2020-05-29 ENCOUNTER — Other Ambulatory Visit: Payer: Self-pay | Admitting: Family

## 2020-06-01 ENCOUNTER — Telehealth: Payer: Self-pay

## 2020-06-01 NOTE — Telephone Encounter (Signed)
Pt returned our call in regards to her lab results.   Pt advised her Sed Rate was normal.

## 2020-06-14 ENCOUNTER — Telehealth: Payer: Self-pay | Admitting: Family

## 2020-06-14 DIAGNOSIS — J45909 Unspecified asthma, uncomplicated: Secondary | ICD-10-CM

## 2020-06-14 NOTE — Telephone Encounter (Signed)
Patient would like a referral to pulmonary for asthma

## 2020-06-14 NOTE — Telephone Encounter (Signed)
Referral reqest 

## 2020-06-15 ENCOUNTER — Ambulatory Visit: Payer: Self-pay

## 2020-06-15 ENCOUNTER — Encounter: Payer: Self-pay | Admitting: Family Medicine

## 2020-06-15 ENCOUNTER — Other Ambulatory Visit: Payer: Self-pay

## 2020-06-15 ENCOUNTER — Ambulatory Visit (INDEPENDENT_AMBULATORY_CARE_PROVIDER_SITE_OTHER): Payer: 59 | Admitting: Family Medicine

## 2020-06-15 DIAGNOSIS — M5441 Lumbago with sciatica, right side: Secondary | ICD-10-CM

## 2020-06-15 MED ORDER — METHOCARBAMOL 500 MG PO TABS: 500.0000 mg | ORAL_TABLET | Freq: Three times a day (TID) | ORAL | 1 refills | Status: DC | PRN

## 2020-06-15 MED ORDER — TRAMADOL HCL 50 MG PO TABS
50.0000 mg | ORAL_TABLET | Freq: Four times a day (QID) | ORAL | 0 refills | Status: DC | PRN
Start: 2020-06-15 — End: 2020-08-09

## 2020-06-15 NOTE — Telephone Encounter (Signed)
Pulmonary referral has been placed. Please give pt number to [facility]. She will need to call herself to schedule.

## 2020-06-15 NOTE — Progress Notes (Signed)
Office Visit Note   Patient: Michelle Kennedy           Date of Birth: 1955/07/12           MRN: [id number] Visit Date: 06/15/2020 Requested by: Debbrah Alar, NP [address] [place],  [place] [address] PCP: Debbrah Alar, NP  Subjective: Chief Complaint  Patient presents with  . Lower Back - Pain    Flared up again yesterday, after cleaning out a closet and lifting something heavy. Pain is the same as last year, when she had right-sided sciatica.    HPI: She is here with flareup of low back pain. In July we injected her SI joint with good results, she was pain-free after 2 days. Recently her husband retired and they are packing boxes, getting ready to move to another state out [place]. At one point she turned and felt immediate severe pain in the right posterior hip/low back. Pain is radiating into the right leg, just like it did before. Very painful to transition.                ROS: No bowel or bladder dysfunction. All other systems were reviewed and are negative.  Objective: Vital Signs: There were no vitals taken for this visit.  Physical Exam:  General:  Alert and oriented, in no acute distress. Pulm:  Breathing unlabored. Psy:  Normal mood, congruent affect. Skin: No rash Low back: She is exquisitely tender near the right SI joint. Straight leg raise negative, lower extremity strength and reflexes are normal.  Imaging: US Guided Needle Placement - No Linked Charges  Result Date: 06/15/2020 Ultrasound guided injection is preferred based studies that show increased duration, increased effect, greater accuracy, decreased procedural pain, increased response rate, and decreased cost with ultrasound guided versus blind injection.   Verbal informed consent obtained.  Time-out conducted.  Noted no overlying erythema, induration, or other signs of local infection. Ultrasound-guided right SI injection: After sterile prep with alcohol, injected 6 mg  betamethasone using a 22-gauge spinal needle, passing the needle through the sacroiliac ligament into the region of the SI joint.       Assessment & Plan: 1. Flareup of right-sided low back pain, suspect sacroiliac dysfunction. -Discussed with her and she wants to try another SI joint injection. This will be done without anesthetic due to allergy. Refilled tramadol and Robaxin, follow-up as needed.     Procedures: No procedures performed        PMFS History: Patient Active Problem List   Diagnosis Date Noted  . Noncompliance with medications 08/23/2019  . Chronic diastolic CHF (congestive heart failure) ([place]) 08/23/2019  . Retinal artery occlusion 08/22/2019  . OA (osteoarthritis) of knee 12/03/2017  . Class 3 obesity without serious comorbidity with body mass index (BMI) of 40.0 to 44.9 in adult 01/01/2017  . Essential hypertension 10/18/2016  . Constipation 10/04/2016  . Vitamin D deficiency 10/04/2016  . Prediabetes 10/04/2016  . Obesity, Class III, BMI 40-49.9 (morbid obesity) ([place]) 09/20/2016  . Other fatigue 09/20/2016  . OSA (obstructive sleep apnea) 02/16/2016  . Right ankle pain 01/20/2016  . Strain of right Achilles tendon 11/30/2015  . Benign hypertension 10/11/2015  . Malignant neoplasm of thyroid gland ([place]) 10/11/2015  . De Quervain's tenosynovitis, bilateral 09/15/2015  . Right shoulder pain 09/15/2015  . Trigger finger, acquired 06/22/2015  . Left wrist pain 06/22/2015  . Thumb injury 06/14/2015  . Chemotherapy-induced peripheral neuropathy ([place]) 05/10/2015  . Postmenopausal osteoporosis  12/08/2014  . Preventative health care 12/01/2014  . DVT (deep venous thrombosis) ([place]) 08/28/2014  . Lymphedema of arm 08/28/2014  . Mucositis due to chemotherapy 06/02/2014  . UTI symptoms 02/17/2014  . Menopausal syndrome (hot flashes) 12/31/2013  . Breast cancer of upper-outer quadrant of left female breast ([place]) 12/26/2013  . Ductal carcinoma in situ (DCIS) of  left breast 12/23/2013  . Gingivitis 12/05/2013  . Seasonal allergies 09/24/2013  . Skin lesion 06/03/2013  . Menopause 06/02/2013  . Elevated blood-pressure reading without diagnosis of hypertension 03/05/2013  . Cough 07/24/2012  . Left knee injury 11/02/2011  . Gout 09/19/2011  . Hyperlipidemia 06/02/2011  . Migraine 06/02/2011  . Tinnitus of both ears 02/14/2011  . Left knee pain 09/23/2010  . FIBROMYALGIA 05/10/2010  . Asthma 03/22/2010  . 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 OVERWEIGHT 12/20/2009  . GERD 12/20/2009  . Depression 12/01/2009   Past Medical History:  Diagnosis Date  . Abnormally small mouth   . Achilles tendon disorder, right   . Anemia   . Anxiety   . Arthritis    hips and knees  . Asthma    daily inhaler, prn inhaler and neb.  . Asthma due to environmental allergies   . Breast cancer ([place]) 01/2014   left  . Cancer ([place])   . Chest pain   . COPD (chronic obstructive pulmonary disease) ([place])   . Dental bridge present    upper front and lower right  . Dental crowns present    x 3  . Depression   . Esophageal spasm    reports since her chemo and breast surgery  she developed esophageal spasms and reports this is in the past has casue her 02 to desat in the  70s , denies sycnope in relation to this , does report hx of vertigo as well   . Family history of anesthesia complication    twin brother aspirated and died on OR table, per pt.  . Fibromyalgia   . Gallbladder disease   . GERD (gastroesophageal reflux disease)   . H/O blood clots    had blood to  PICC line   . History of gastric ulcer    as a teenager  . History of seizure age 1   as a reaction to Penicillin - no seizures since  . History of stomach ulcers   . History of thyroid cancer    s/p thyroidectomy  . Hyperlipidemia   . Hypothyroidism   . Joint pain   . Left knee injury   . Liver disorder    seen when she had her hysterectomy , reports she was told it was  " small lesion"  ; but asymptomatic   . Lymphedema    left arm  . Migraines   . Multiple food allergies   . Obesity   . OSA (obstructive sleep apnea) 02/16/2016  . Palpitations    reports no longer experiences  . Personal history of chemotherapy 2015  . Personal history of radiation therapy 2015   Left  . Prediabetes   . Rheumatoid arthritis ([place])   . SOB (shortness of breath)   . Swelling of both ankles   . Tinnitus   . UTI (lower urinary tract infection) 02/17/2014  . Vertigo    last episode  was 2 weeks ago   . Vitamin D deficiency   . Wears contact lenses    left eye only   . Wears hearing aid in both ears  Family History  Problem Relation Age of Onset  . Alcohol abuse Mother   . Arthritis Mother   . Hypertension Mother   . Bipolar disorder Mother   . Breast cancer Mother 28       unconfirmed  . Lung cancer Mother 15       smoker  . Hyperlipidemia Mother   . Stroke Mother   . Cancer Mother   . Depression Mother   . Anxiety disorder Mother   . Alcoholism Mother   . Drug abuse Mother   . Eating disorder Mother   . Obesity Mother   . Alcohol abuse Father   . Cancer Father        lung  . Hyperlipidemia Father   . Kidney disease Father   . Diabetes Father   . Hypertension Father   . Cystic kidney disease Father   . Thyroid disease Father   . Liver disease Father   . Alcoholism Father   . Arthritis Maternal Grandmother   . Diabetes Paternal Grandmother   . Thyroid cancer Sister 77       type?; currently 30  . Other Sister        ovarian tumor @ 49; TAH/BSO  . Breast cancer Sister   . Thyroid cancer Brother        dx 60s; currently 51  . Breast cancer Maternal Aunt        dx 60s; deceased 59  . Thyroid cancer Paternal Aunt        All 3 paternal aunts with thyroid ca in 30s/40s  . Lung cancer Paternal Aunt        2 of 3 paternal aunts with lung cancer    Past Surgical History:  Procedure Laterality Date  . ABDOMINAL HYSTERECTOMY  2004   complete  . ACHILLES  TENDON REPAIR Right   . APPENDECTOMY  2004  . BREAST EXCISIONAL BIOPSY    . BREAST LUMPECTOMY Left    2015  . BREAST LUMPECTOMY WITH NEEDLE LOCALIZATION AND AXILLARY SENTINEL LYMPH NODE BX Left 02/23/2014   Procedure: BREAST LUMPECTOMY WITH NEEDLE LOCALIZATION AND AXILLARY SENTINEL LYMPH NODE BIOPSY;  Surgeon: Excell Seltzer, MD;  Location: [facility];  Service: General;  Laterality: Left;  . BREAST SURGERY  2011   left breast biposy  . CHOLECYSTECTOMY  1990  . COLONOSCOPY W/ POLYPECTOMY  06/2009  . EYE SURGERY Right 2013   exc. warts from underneath eyelid  . INCONTINENCE SURGERY  2004  . KNEE ARTHROSCOPY Bilateral    x 6 each knee  . LIGAMENT REPAIR Right    thumb/wrist  . ORIF TOE FRACTURE Right    great toe  . PORTACATH PLACEMENT N/A 03/24/2014   Procedure: INSERTION PORT-A-CATH;  Surgeon: Excell Seltzer, MD;  Location: [facility];  Service: General;  Laterality: N/A;; removed   . THYROIDECTOMY  2000  . TONSILLECTOMY AND ADENOIDECTOMY  2000  . TOTAL KNEE ARTHROPLASTY Left 12/03/2017   Procedure: LEFT TOTAL KNEE ARTHROPLASTY;  Surgeon: Gaynelle Arabian, MD;  Location: [facility];  Service: Orthopedics;  Laterality: Left;  . TUMOR EXCISION     from thoracic spine   Social History   Occupational History  . 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 Smoking status: Never Smoker  . Smokeless tobacco: Never Used  Vaping Use  . Vaping Use: Never used  Substance and Sexual Activity  . Alcohol use: No    Alcohol/week: 0.0 standard  drinks  . Drug use: No  . Sexual activity: Not Currently    Partners: Male    Comment: menarche age 38, P 2, first birth age 19, menopause age 74, Premarin x 10 yrs

## 2020-06-15 NOTE — Telephone Encounter (Signed)
Left detail message for patient to be aware referral was placed and phone number was provided in message

## 2020-06-17 ENCOUNTER — Ambulatory Visit: Payer: 59 | Admitting: Family Medicine

## 2020-06-17 ENCOUNTER — Encounter: Payer: Self-pay | Admitting: Nurse Practitioner

## 2020-06-23 ENCOUNTER — Encounter: Payer: Self-pay | Admitting: Nurse Practitioner

## 2020-06-23 ENCOUNTER — Telehealth: Payer: Self-pay | Admitting: Family

## 2020-06-23 ENCOUNTER — Ambulatory Visit (INDEPENDENT_AMBULATORY_CARE_PROVIDER_SITE_OTHER): Payer: 59 | Admitting: Nurse Practitioner

## 2020-06-23 ENCOUNTER — Other Ambulatory Visit (INDEPENDENT_AMBULATORY_CARE_PROVIDER_SITE_OTHER): Payer: 59

## 2020-06-23 ENCOUNTER — Ambulatory Visit: Payer: 59 | Admitting: Family

## 2020-06-23 VITALS — BP 122/70 | HR 84 | Ht 66.0 in | Wt 291.4 lb

## 2020-06-23 DIAGNOSIS — R718 Other abnormality of red blood cells: Secondary | ICD-10-CM

## 2020-06-23 DIAGNOSIS — Z7901 Long term (current) use of anticoagulants: Secondary | ICD-10-CM | POA: Diagnosis not present

## 2020-06-23 DIAGNOSIS — Z1211 Encounter for screening for malignant neoplasm of colon: Secondary | ICD-10-CM

## 2020-06-23 LAB — FERRITIN: Ferritin: 94.6 ng/mL (ref 10.0–291.0)

## 2020-06-23 NOTE — Telephone Encounter (Signed)
Pt dropped off document to be filled out by provider (1 page Parking Disability paperwork) Pt also dropped off letter about her prescription info for provider to have on file and to be informed. Pt would like to be called when document ready to pick up (disability form) at [phone]. Document put at front office tray.

## 2020-06-23 NOTE — Progress Notes (Signed)
ASSESSMENT AND PLAN    # Colon cancer screening. Due for 10 year screeningcolonoscopy. Her last one was April 2011, ? At [facility].  I am unable to determine which provider performed the procedure.   -- Hold off on scheduling screening colonoscopy for now. Patient gives a history of asthma and has had increased SOB lately. Though lungs clear on exam her breathing is labored while getting on exam table and trying to conduct a conversation. She is establishing care with Pulmonology next week. If Pulmonary evaluation is negative and shortness of breath persists she may need to see Cardiology.  Echo April 2021 with EF of 96-28%, grade I diastolic dysfunction. No lower extremity edema. No chest pain.  --I will see her back in a few weeks. If respiratory status improved then will schedule colonoscopy.    # Hx of asthma, as above.   # History of CVA, on plavix. This will need to be held for colonoscopy, will address at follow up visit.   # Chronic microcytosis without anemia. Hgb 14.5 in Aug 2021 --check iron studies.  # Obesity   HISTORY OF PRESENT ILLNESS     Primary Gastroenterologist : new - Wilfrid Lund, MD  Chief Complaint : colon cancer screening  Michelle Kennedy is a 65 y.o. female with PMH / Kenneth significant for,  but not necessarily limited to: COPD, asthma, thyroid cancer s/p thyroidectomy, breast cancer s/p lumpectomy and chemoradiation 5 years ago on Arimidex, CVA 2021 on Plavix, cholecytectomy, appendectomy  Patient is here to scheduled 10 year screening colonoscopy. She has no Gi complaints such as bowel changes, blood in stool or black stools. No abdominal pain, nausea or vomiting.  She has no [facility] of colon cancer.   Patient has a hx of asthma, feels more wheezy and SOB lately. To see a Pulmonologist on 06/30/20.    Data Reviewed: August 2021 Hgb 14.5, MCV 78   Echo April 2021 challenging due to patient body habitus. 1. Left ventricular ejection fraction, by  estimation, is 55 to 60%. The left ventricle has normal function. The left ventricle has no regional wall motion abnormalities. There is moderate left ventricular hypertrophy. Left ventricular diastolic parameters are consistent with Grade I diastolic dysfunction (impaired relaxation). No apical thrombus seen with Definity contrast. 2. Right ventricular systolic function was not well visualized. The right ventricular size is not well visualized. 3. The mitral valve is grossly normal. No evidence of mitral valve regurgitation. 4. The aortic valve was not well visualized. Aortic valve regurgitation is not visualized. No aortic stenosis is present. 5. The inferior vena cava is normal in size with greater than 50% respiratory variability, suggesting right atrial pressure of 3 mm    Past Medical History:  Diagnosis Date  . Abnormally small mouth   . Achilles tendon disorder, right   . Anemia   . Anxiety   . Arthritis    hips and knees  . Asthma    daily inhaler, prn inhaler and neb.  . Breast cancer ([place]) 01/2014   left  . COPD (chronic obstructive pulmonary disease) ([place])   . Dental bridge present    upper front and lower right  . Dental crowns present    x 3  . Depression   . Esophageal spasm    reports since her chemo and breast surgery  she developed esophageal spasms and reports this is in the past has casue her 02 to desat in the  70s , denies  sycnope in relation to this , does report hx of vertigo as well   . Family history of anesthesia complication    twin brother aspirated and died on OR table, per pt.  . Fibromyalgia   . GERD (gastroesophageal reflux disease)   . H/O blood clots    had blood to  PICC line   . History of gastric ulcer    as a teenager  . History of seizure age 74   as a reaction to Penicillin - no seizures since  . History of thyroid cancer    s/p thyroidectomy  . Hyperlipidemia   . Hypothyroidism   . Left knee injury   . Liver disorder    seen when  she had her hysterectomy , reports she was told it was  " small lesion" ; but asymptomatic   . Lymphedema    left arm  . Migraines   . Multiple food allergies   . Obesity   . OSA (obstructive sleep apnea) 02/16/2016  . Palpitations    reports no longer experiences  . Personal history of chemotherapy 2015  . Personal history of radiation therapy 2015   Left  . Prediabetes   . Rheumatoid arthritis ([place])   . SOB (shortness of breath)   . Tinnitus   . UTI (lower urinary tract infection) 02/17/2014  . Vertigo    last episode  was 2 weeks ago   . Vitamin D deficiency   . Wears contact lenses    left eye only      Past Surgical History:  Procedure Laterality Date  . ABDOMINAL HYSTERECTOMY  2004   complete  . ACHILLES TENDON REPAIR Right   . APPENDECTOMY  2004  . BREAST EXCISIONAL BIOPSY    . BREAST LUMPECTOMY Left    2015  . BREAST LUMPECTOMY WITH NEEDLE LOCALIZATION AND AXILLARY SENTINEL LYMPH NODE BX Left 02/23/2014   Procedure: BREAST LUMPECTOMY WITH NEEDLE LOCALIZATION AND AXILLARY SENTINEL LYMPH NODE BIOPSY;  Surgeon: Excell Seltzer, MD;  Location: [facility];  Service: General;  Laterality: Left;  . BREAST SURGERY  2011   left breast biposy  . CHOLECYSTECTOMY  1990  . COLONOSCOPY W/ POLYPECTOMY  06/2009  . EYE SURGERY Right 2013   exc. warts from underneath eyelid  . INCONTINENCE SURGERY  2004  . KNEE ARTHROSCOPY Bilateral    x 6 each knee  . LIGAMENT REPAIR Right    thumb/wrist  . ORIF TOE FRACTURE Right    great toe  . PORTACATH PLACEMENT N/A 03/24/2014   Procedure: INSERTION PORT-A-CATH;  Surgeon: Excell Seltzer, MD;  Location: [facility];  Service: General;  Laterality: N/A;; removed   . THYROIDECTOMY  2000  . TONSILLECTOMY AND ADENOIDECTOMY  2000  . TOTAL KNEE ARTHROPLASTY Left 12/03/2017   Procedure: LEFT TOTAL KNEE ARTHROPLASTY;  Surgeon: Gaynelle Arabian, MD;  [facility];  Service: Orthopedics;  Laterality: Left;  . TUMOR  EXCISION     from thoracic spine   Family History  Problem Relation Age of Onset  . Alcohol abuse Mother   . Arthritis Mother   . Hypertension Mother   . Bipolar disorder Mother   . Breast cancer Mother 60       unconfirmed  . Lung cancer Mother 54       smoker  . Hyperlipidemia Mother   . Stroke Mother   . Cancer Mother   . Depression Mother   . Anxiety disorder Mother   . Alcoholism  Mother   . Drug abuse Mother   . Eating disorder Mother   . Obesity Mother   . Alcohol abuse Father   . Cancer Father        lung  . Hyperlipidemia Father   . Kidney disease Father   . Diabetes Father   . Hypertension Father   . Cystic kidney disease Father   . Thyroid disease Father   . Liver disease Father   . Alcoholism Father   . Arthritis Maternal Grandmother   . Diabetes Paternal Grandmother   . Thyroid cancer Sister 28       type?; currently 17  . Other Sister        ovarian tumor @ 71; TAH/BSO  . Breast cancer Sister   . Thyroid cancer Brother        dx 16s; currently 57  . Breast cancer Maternal Aunt        dx 18s; deceased 42  . Thyroid cancer Paternal Aunt        All 3 paternal aunts with thyroid ca in 30s/40s  . Lung cancer Paternal Aunt        2 of 3 paternal aunts with lung cancer   Social History   Tobacco Use  . Smoking status: Never Smoker  . Smokeless tobacco: Never Used  Vaping Use  . Vaping Use: Never used  Substance Use Topics  . Alcohol use: No    Alcohol/week: 0.0 standard drinks  . Drug use: No   Current Outpatient Medications  Medication Sig Dispense Refill  . albuterol (PROVENTIL) (5 MG/ML) 0.5% nebulizer solution Take 0.5 mLs (2.5 mg total) by nebulization every 6 (six) hours as needed for wheezing or shortness of breath. 40 mL 3  . albuterol (VENTOLIN HFA) 108 (90 Base) MCG/ACT inhaler Inhale 2 puffs into the lungs every 6 (six) hours as needed for wheezing or shortness of breath. 18 g 5  . ALPHAGAN P 0.1 % SOLN Place 1 drop into both eyes in  the morning and at bedtime.     Marland Kitchen amLODipine (NORVASC) 5 MG tablet Take 1 tablet (5 mg total) by mouth daily. 90 tablet 1  . anastrozole (ARIMIDEX) 1 MG tablet TAKE 1 TABLET BY MOUTH  DAILY 90 tablet 0  . atorvastatin (LIPITOR) 40 MG tablet TAKE 1 TABLET BY MOUTH  DAILY 90 tablet 0  . CALCIUM PO Take 1 tablet by mouth every evening.    . cholecalciferol (VITAMIN D3) 25 MCG (1000 UNIT) tablet Take 1,000 Units by mouth every evening.     . clopidogrel (PLAVIX) 75 MG tablet Take 1 tablet (75 mg total) by mouth daily. 90 tablet 3  . Fluticasone-Salmeterol (WIXELA INHUB) 250-50 MCG/DOSE AEPB Inhale 1 puff into the lungs 2 (two) times daily. 180 each 3  . furosemide (LASIX) 20 MG tablet TAKE 1 TABLET(20 MG) BY MOUTH DAILY AS NEEDED FOR SWELLING 30 tablet 3  . GEMTESA 75 MG TABS Take 1 tablet by mouth daily.    Marland Kitchen levothyroxine (SYNTHROID) 200 MCG tablet TAKE 1 TABLET BY MOUTH  DAILY BEFORE BREAKFAST 90 tablet 1  . lisinopril (ZESTRIL) 20 MG tablet Take 2 tablets (40 mg total) by mouth daily. 135 tablet 1  . meloxicam (MOBIC) 7.5 MG tablet Take 7.5 mg by mouth daily.    . methocarbamol (ROBAXIN) 500 MG tablet Take 1 tablet (500 mg total) by mouth every 8 (eight) hours as needed for muscle spasms. 60 tablet 1  . Multiple Vitamin (MULTIVITAMIN ADULT)  TABS Take 1 tablet by mouth daily.    . Multiple Vitamins-Minerals (OCUVITE PO) Take 1 tablet by mouth daily.    Marland Kitchen MYRBETRIQ 50 MG TB24 tablet TAKE 1 TABLET BY MOUTH  DAILY 90 tablet 1  . Omega-3 Fatty Acids (FISH OIL) 1000 MG CAPS Take 1 capsule by mouth every evening.     . pantoprazole (PROTONIX) 40 MG tablet Take 1 tablet (40 mg total) by mouth daily. 30 tablet 3  . traMADol (ULTRAM) 50 MG tablet Take 1 tablet (50 mg total) by mouth every 6 (six) hours as needed. 30 tablet 0   No current facility-administered medications for this visit.   Allergies  Allergen Reactions  . Bee Venom Anaphylaxis  . Codeine Shortness Of Breath and Rash  . Contrast Media  [Iodinated Diagnostic Agents] Shortness Of Breath  . Iodine Other (See Comments)    CARDIAC ARREST  . Latex Anaphylaxis and Rash  . Lidocaine Shortness Of Breath and Swelling    SWELLING OF MOUTH AND THROAT, low BP  . Penicillins Shortness Of Breath, Rash and Other (See Comments)    SEIZURE Did it involve swelling of the face/tongue/throat, SOB, or low BP? no Did it involve sudden or severe rash/hives, skin peeling, or any reaction on the inside of your mouth or nose? yes Did you need to seek medical attention at a hospital or doctor's office? yes When did it last happen?2010 If all above answers are "NO", may proceed with cephalosporin use.   Marland Kitchen Pentazocine Lactate Shortness Of Breath and Rash  . Shellfish Allergy Shortness Of Breath and Rash  . Aspirin Rash and Other (See Comments)    GI UPSET  . Erythromycin Swelling and Rash    SWELLING OF JOINTS  . Symbicort [Budesonide-Formoterol Fumarate] Other (See Comments)    BURNING OF TONGUE AND LIPS  . Betadine [Povidone Iodine]     Rash. Breathing problems.   . Compazine [Prochlorperazine Maleate] Rash    Rash on face,chest, arms, back  . Sulfonamide Derivatives Rash     Review of Systems: Positive for allergy, sinus trouble, back pain, vision changes, shortness of breath, urine leakage, swelling of feet and legs.  All other systems reviewed and negative except where noted in HPI.   PHYSICAL EXAM :    Wt Readings from Last 3 Encounters:  06/23/20 291 lb 6 oz (132.2 kg)  05/20/20 295 lb 12.8 oz (134.2 kg)  03/31/20 296 lb 8 oz (134.5 kg)    Ht 5\' 6"  (1.676 m) Comment: height measured without shoes  Wt 291 lb 6 oz (132.2 kg)   BMI 47.03 kg/m  Constitutional:  Pleasant female in no acute distress. BMI 47 Psychiatric: Normal mood and affect. Behavior is normal. EENT: Pupils normal.  Conjunctivae are normal. No scleral icterus. Neck supple.  Cardiovascular: Normal rate, regular rhythm. No edema Pulmonary/chest: Effort  normal and breath sounds normal. No wheezing, rales or rhonchi. Abdominal: Soft, nondistended, nontender. Bowel sounds active throughout. There are no masses palpable. No hepatomegaly. Neurological: Alert and oriented to person place and time. Skin: Skin is warm and dry. No rashes noted.  Tye Savoy, NP  06/23/2020, 10:03 AM

## 2020-06-23 NOTE — Telephone Encounter (Signed)
Documents put in provider's folder to be signed

## 2020-06-23 NOTE — Patient Instructions (Addendum)
If you are age 67 or older, your body mass index should be between 23-30. Your Body mass index is 47.03 kg/m. If this is out of the aforementioned range listed, please consider follow up with your Primary Care Provider.  Your provider has requested that you go to the basement level for lab work before leaving today. Press "B" on the elevator. The lab is located at the first door on the left as you exit the elevator.  Please keep follow-up in 4-6 weeks with Nevin Bloodgood on 08/06/20 @ 1:30pm. At that time will discuss scheduling colonoscopy depending on your Pulmonary Evaluation.    Due to recent changes in healthcare laws, you may see the results of your imaging and laboratory studies on MyChart before your provider has had a chance to review them.  We understand that in some cases there may be results that are confusing or concerning to you. Not all laboratory results come back in the same time frame and the provider may be waiting for multiple results in order to interpret others.  Please give [place] 48 hours in order for your provider to thoroughly review all the results before contacting the office for clarification of your results.   Thank you for choosing me and [facility].  Tye Savoy NP

## 2020-06-24 LAB — IRON AND TIBC
Iron Saturation: 24 % (ref 15–55)
Iron: 71 ug/dL (ref 27–139)
Total Iron Binding Capacity: 294 ug/dL (ref 250–450)
UIBC: 223 ug/dL (ref 118–369)

## 2020-06-24 NOTE — Progress Notes (Signed)
____________________________________________________________  Attending physician addendum:  Thank you for sending this case to me. I have reviewed the entire note and agree with the plan.  Please check with me when you next see patient and before scheduling a procedure.   A Cologuard might be better for her due to respiratory risks from her asthma/COPD.  Wilfrid Lund, MD  ____________________________________________________________

## 2020-06-28 ENCOUNTER — Telehealth: Payer: Self-pay | Admitting: Family

## 2020-06-28 ENCOUNTER — Ambulatory Visit (INDEPENDENT_AMBULATORY_CARE_PROVIDER_SITE_OTHER): Payer: 59 | Admitting: Family

## 2020-06-28 ENCOUNTER — Other Ambulatory Visit: Payer: Self-pay

## 2020-06-28 VITALS — BP 141/66 | HR 65 | Temp 98.5°F | Resp 16 | Ht 66.0 in | Wt 292.0 lb

## 2020-06-28 DIAGNOSIS — I10 Essential (primary) hypertension: Secondary | ICD-10-CM

## 2020-06-28 DIAGNOSIS — E039 Hypothyroidism, unspecified: Secondary | ICD-10-CM

## 2020-06-28 DIAGNOSIS — Z23 Encounter for immunization: Secondary | ICD-10-CM

## 2020-06-28 DIAGNOSIS — E785 Hyperlipidemia, unspecified: Secondary | ICD-10-CM | POA: Diagnosis not present

## 2020-06-28 DIAGNOSIS — N3281 Overactive bladder: Secondary | ICD-10-CM

## 2020-06-28 DIAGNOSIS — Z170 Estrogen receptor positive status [ER+]: Secondary | ICD-10-CM

## 2020-06-28 DIAGNOSIS — C50412 Malignant neoplasm of upper-outer quadrant of left female breast: Secondary | ICD-10-CM

## 2020-06-28 DIAGNOSIS — J45909 Unspecified asthma, uncomplicated: Secondary | ICD-10-CM

## 2020-06-28 MED ORDER — PANTOPRAZOLE SODIUM 40 MG PO TBEC: 40.0000 mg | DELAYED_RELEASE_TABLET | Freq: Every day | ORAL | 1 refills | Status: DC

## 2020-06-28 NOTE — Progress Notes (Signed)
Subjective:    Patient ID: Michelle Kennedy, female    DOB: 1955/06/01, 65 y.o.   MRN: [id number]  HPI  Patient is a 65 yr old female who presents today for follow up.   HTN- on amloidipine 5mg , lisinopril 20mg  and prn lasix 20mg .   BP Readings from Last 3 Encounters:  06/28/20 (!) 141/66  06/23/20 122/70  05/20/20 (!) 147/80   OAB- maintained on myrbetriq 50mg  which is helpful.  Asthma- maintained on Wixela 250-53m bid. She notes that she continues to have wheezing. She is scheduled to follow up with pulmonology.   Hypothyroid-  Lab Results  Component Value Date   TSH 0.65 03/01/2020   Hyperlipidemia- maintained on atorvastatin.  Lab Results  Component Value Date   CHOL 174 08/23/2019   HDL 48 08/23/2019   LDLCALC 102 (H) 08/23/2019   LDLDIRECT 117.0 02/26/2019   TRIG 121 08/23/2019   CHOLHDL 3.6 08/23/2019     Review of Systems   See HPI  Past Medical History:  Diagnosis Date  . Abnormally small mouth   . Achilles tendon disorder, right   . Anemia   . Anxiety   . Arthritis    hips and knees  . Asthma    daily inhaler, prn inhaler and neb.  . Asthma due to environmental allergies   . Breast cancer ([place]) 01/2014   left  . Chest pain   . COPD (chronic obstructive pulmonary disease) ([place])   . Dental bridge present    upper front and lower right  . Dental crowns present    x 3  . Depression   . Esophageal spasm    reports since her chemo and breast surgery  she developed esophageal spasms and reports this is in the past has casue her 02 to desat in the  70s , denies sycnope in relation to this , does report hx of vertigo as well   . Family history of anesthesia complication    twin brother aspirated and died on OR table, per pt.  . Fibromyalgia   . Gallbladder disease   . Gallstones   . GERD (gastroesophageal reflux disease)   . Glaucoma   . H/O blood clots    had blood to  PICC line   . History of gastric ulcer    as a teenager  . History of  seizure age 61   as a reaction to Penicillin - no seizures since  . History of stomach ulcers   . History of thyroid cancer    s/p thyroidectomy  . HTN (hypertension)   . Hyperlipidemia   . Hypothyroidism   . Joint pain   . Left knee injury   . Liver disorder    seen when she had her hysterectomy , reports she was told it was  " small lesion" ; but asymptomatic   . Lymphedema    left arm  . Migraines   . Multiple food allergies   . Obesity   . OSA (obstructive sleep apnea) 02/16/2016   CPAP  . Palpitations    reports no longer experiences  . Personal history of chemotherapy 2015  . Personal history of radiation therapy 2015   Left  . Prediabetes   . Rheumatoid arthritis ([place])   . SOB (shortness of breath)   . Swelling of both ankles   . Tinnitus   . UTI (lower urinary tract infection) 02/17/2014  . Vertigo    last episode  was 2 weeks ago   .  Vitamin D deficiency   . Wears contact lenses    left eye only   . Wears hearing aid in both ears      Social History   Socioeconomic History  . Marital status: Married    Spouse name: Not on file  . Number of children: 2  . Years of education: Not on file  . Highest education level: Not on file  Occupational History  . 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 Smoking status: Never Smoker  . Smokeless tobacco: Never Used  Vaping Use  . Vaping Use: Never used  Substance and Sexual Activity  . Alcohol use: No    Alcohol/week: 0.0 standard drinks  . Drug use: No  . Sexual activity: Not Currently    Partners: Male    Comment: menarche age 35, P 2, first birth age 10, menopause age 28, Premarin x 10 yrs  Other Topics Concern  . Not on file  Social History Narrative   Regular exercise: yes   Right handed    Lives with husband one story home.   Social Determinants of Health   Financial Resource Strain: Not on file  Food Insecurity: Not on file  Transportation Needs: Not on file  Physical  Activity: Not on file  Stress: Not on file  Social Connections: Not on file  Intimate Partner Violence: Not on file    Past Surgical History:  Procedure Laterality Date  . ABDOMINAL HYSTERECTOMY  2004   complete  . ACHILLES TENDON REPAIR Right   . APPENDECTOMY  2004  . BREAST EXCISIONAL BIOPSY    . BREAST LUMPECTOMY Left    2015  . BREAST LUMPECTOMY WITH NEEDLE LOCALIZATION AND AXILLARY SENTINEL LYMPH NODE BX Left 02/23/2014   Procedure: BREAST LUMPECTOMY WITH NEEDLE LOCALIZATION AND AXILLARY SENTINEL LYMPH NODE BIOPSY;  Surgeon: Excell Seltzer, MD;  Location: [facility];  Service: General;  Laterality: Left;  . BREAST SURGERY  2011   left breast biposy  . CHOLECYSTECTOMY  1990  . COLONOSCOPY W/ POLYPECTOMY  06/2009  . EYE SURGERY Right 2013   exc. warts from underneath eyelid  . INCONTINENCE SURGERY  2004  . KNEE ARTHROSCOPY Bilateral    x 6 each knee  . LIGAMENT REPAIR Right    thumb/wrist  . ORIF TOE FRACTURE Right    great toe  . PORTACATH PLACEMENT N/A 03/24/2014   Procedure: INSERTION PORT-A-CATH;  Surgeon: Excell Seltzer, MD;  Location: [facility];  Service: General;  Laterality: N/A;; removed   . THYROIDECTOMY  2000  . TONSILLECTOMY AND ADENOIDECTOMY  2000  . TOTAL KNEE ARTHROPLASTY Left 12/03/2017   Procedure: LEFT TOTAL KNEE ARTHROPLASTY;  Surgeon: Gaynelle Arabian, MD;  Location: [facility];  Service: Orthopedics;  Laterality: Left;  . TUMOR EXCISION     from thoracic spine    Family History  Problem Relation Age of Onset  . Alcohol abuse Mother   . Arthritis Mother   . Hypertension Mother   . Bipolar disorder Mother   . Breast cancer Mother 25       unconfirmed  . Lung cancer Mother 6       smoker  . Hyperlipidemia Mother   . Stroke Mother   . Cancer Mother   . Depression Mother   . Anxiety disorder Mother   . Alcoholism Mother   . Drug abuse Mother   . Eating disorder Mother   . Obesity Mother   .  Alcohol abuse Father    . Hyperlipidemia Father   . Kidney disease Father   . Diabetes Father   . Hypertension Father   . Cystic kidney disease Father   . Thyroid disease Father   . Liver disease Father   . Alcoholism Father   . Lung cancer Father   . Arthritis Maternal Grandmother   . Diabetes Paternal Grandmother   . Thyroid cancer Sister 65       type?; currently 68  . Other Sister        ovarian tumor @ 68; TAH/BSO  . Breast cancer Sister   . Thyroid cancer Brother        dx 27s; currently 27  . Breast cancer Maternal Aunt        dx 83s; deceased 49  . Thyroid cancer Paternal Aunt        All 3 paternal aunts with thyroid ca in 30s/40s  . Lung cancer Paternal Aunt        2 of 3 paternal aunts with lung cancer    Allergies  Allergen Reactions  . Bee Venom Anaphylaxis  . Codeine Shortness Of Breath and Rash  . Contrast Media [Iodinated Diagnostic Agents] Shortness Of Breath  . Iodine Other (See Comments)    CARDIAC ARREST  . Latex Anaphylaxis and Rash  . Lidocaine Shortness Of Breath and Swelling    SWELLING OF MOUTH AND THROAT, low BP  . Penicillins Shortness Of Breath, Rash and Other (See Comments)    SEIZURE Did it involve swelling of the face/tongue/throat, SOB, or low BP? no Did it involve sudden or severe rash/hives, skin peeling, or any reaction on the inside of your mouth or nose? yes Did you need to seek medical attention at a hospital or doctor's office? yes When did it last happen?2010 If all above answers are "NO", may proceed with cephalosporin use.   Marland Kitchen Pentazocine Lactate Shortness Of Breath and Rash  . Shellfish Allergy Shortness Of Breath and Rash  . Aspirin Rash and Other (See Comments)    GI UPSET  . Erythromycin Swelling and Rash    SWELLING OF JOINTS  . Symbicort [Budesonide-Formoterol Fumarate] Other (See Comments)    BURNING OF TONGUE AND LIPS  . Betadine [Povidone Iodine]     Rash. Breathing problems.   . Compazine [Prochlorperazine Maleate] Rash     Rash on face,chest, arms, back  . Sulfonamide Derivatives Rash    Current Outpatient Medications on File Prior to Visit  Medication Sig Dispense Refill  . albuterol (PROVENTIL) (5 MG/ML) 0.5% nebulizer solution Take 0.5 mLs (2.5 mg total) by nebulization every 6 (six) hours as needed for wheezing or shortness of breath. 40 mL 3  . albuterol (VENTOLIN HFA) 108 (90 Base) MCG/ACT inhaler Inhale 2 puffs into the lungs every 6 (six) hours as needed for wheezing or shortness of breath. 18 g 5  . ALPHAGAN P 0.1 % SOLN Place 1 drop into both eyes in the morning and at bedtime.     Marland Kitchen amLODipine (NORVASC) 5 MG tablet Take 1 tablet (5 mg total) by mouth daily. 90 tablet 1  . anastrozole (ARIMIDEX) 1 MG tablet TAKE 1 TABLET BY MOUTH  DAILY 90 tablet 0  . atorvastatin (LIPITOR) 40 MG tablet TAKE 1 TABLET BY MOUTH  DAILY 90 tablet 0  . CALCIUM PO Take 1 tablet by mouth every evening.    . cholecalciferol (VITAMIN D3) 25 MCG (1000 UNIT) tablet Take 1,000 Units by mouth every  evening.     . clopidogrel (PLAVIX) 75 MG tablet Take 1 tablet (75 mg total) by mouth daily. 90 tablet 3  . Fluticasone-Salmeterol (WIXELA INHUB) 250-50 MCG/DOSE AEPB Inhale 1 puff into the lungs 2 (two) times daily. 180 each 3  . furosemide (LASIX) 20 MG tablet TAKE 1 TABLET(20 MG) BY MOUTH DAILY AS NEEDED FOR SWELLING 30 tablet 3  . GEMTESA 75 MG TABS Take 1 tablet by mouth daily.    Marland Kitchen levothyroxine (SYNTHROID) 200 MCG tablet TAKE 1 TABLET BY MOUTH  DAILY BEFORE BREAKFAST 90 tablet 1  . lisinopril (ZESTRIL) 20 MG tablet Take 2 tablets (40 mg total) by mouth daily. 135 tablet 1  . meloxicam (MOBIC) 7.5 MG tablet Take 7.5 mg by mouth daily.    . methocarbamol (ROBAXIN) 500 MG tablet Take 1 tablet (500 mg total) by mouth every 8 (eight) hours as needed for muscle spasms. 60 tablet 1  . Multiple Vitamin (MULTIVITAMIN ADULT) TABS Take 1 tablet by mouth daily.    . Multiple Vitamins-Minerals (OCUVITE PO) Take 1 tablet by mouth daily.    Marland Kitchen  MYRBETRIQ 50 MG TB24 tablet TAKE 1 TABLET BY MOUTH  DAILY 90 tablet 1  . Omega-3 Fatty Acids (FISH OIL) 1000 MG CAPS Take 1 capsule by mouth every evening.     . traMADol (ULTRAM) 50 MG tablet Take 1 tablet (50 mg total) by mouth every 6 (six) hours as needed. 30 tablet 0   No current facility-administered medications on file prior to visit.    BP (!) 141/66 (BP Location: Right Arm, Patient Position: Sitting, Cuff Size: Large)   Pulse 65   Temp 98.5 F (36.9 C) (Oral)   Resp 16   Ht 5\' 6"  (1.676 m)   Wt 292 lb (132.5 kg)   SpO2 96%   BMI 47.13 kg/m        Objective:   Physical Exam Constitutional:      Appearance: She is well-developed and well-nourished.  Cardiovascular:     Rate and Rhythm: Normal rate and regular rhythm.     Heart sounds: Normal heart sounds. No murmur heard.   Pulmonary:     Effort: Pulmonary effort is normal. No respiratory distress.     Breath sounds: Wheezing (sound very upper airway) present.  Psychiatric:        Mood and Affect: Mood and affect normal.        Behavior: Behavior normal.        Thought Content: Thought content normal.        Judgment: Judgment normal.           Assessment & Plan:  HTN- bp is stable. Continue amloidipine 5mg , lisinopril 20mg  and prn lasix 20mg .    OAB- stable on myrbetriq 50mg . Continue same.  Asthma- fair control- management per pulmonology.   Hypothyroid- stable on synthroid 273mcg once daily. Pt will be reminded to follow up with endocrinology.  Hx of breast cancer- currently under surveillance by oncology.  Hyperlipidemia- tolerating atorvastatin 40mg .  Obtain follow up lipid panel.   This visit occurred during the SARS-CoV-2 public health emergency.  Safety protocols were in place, including screening questions prior to the visit, additional usage of staff PPE, and extensive cleaning of exam room while observing appropriate contact time as indicated for disinfecting solutions.

## 2020-06-28 NOTE — Telephone Encounter (Signed)
See mychart.  

## 2020-06-28 NOTE — Patient Instructions (Signed)
Please complete lab work prior to leaving.   

## 2020-06-29 LAB — LIPID PANEL
Cholesterol: 164 mg/dL (ref 0–200)
HDL: 55.1 mg/dL (ref >39.00)
LDL Cholesterol: 77 mg/dL (ref 0–99)
NonHDL: 108.45
Total CHOL/HDL Ratio: 3
Triglycerides: 157 mg/dL — ABNORMAL HIGH (ref 0.0–149.0)
VLDL: 31.4 mg/dL (ref 0.0–40.0)

## 2020-06-29 LAB — BASIC METABOLIC PANEL
BUN: 12 mg/dL (ref 6–23)
CO2: 31 mEq/L (ref 19–32)
Calcium: 9.7 mg/dL (ref 8.4–10.5)
Chloride: 103 mEq/L (ref 96–112)
Creatinine, Ser: 0.83 mg/dL (ref 0.40–1.20)
GFR: 73.66 mL/min (ref >60.00)
Glucose, Bld: 82 mg/dL (ref 70–99)
Potassium: 4.4 mEq/L (ref 3.5–5.1)
Sodium: 140 mEq/L (ref 135–145)

## 2020-06-30 ENCOUNTER — Other Ambulatory Visit: Payer: Self-pay

## 2020-06-30 ENCOUNTER — Encounter: Payer: Self-pay | Admitting: Pulmonary Disease

## 2020-06-30 ENCOUNTER — Ambulatory Visit (INDEPENDENT_AMBULATORY_CARE_PROVIDER_SITE_OTHER): Payer: 59 | Admitting: Pulmonary Disease

## 2020-06-30 VITALS — BP 138/74 | HR 88 | Temp 97.4°F | Ht 67.0 in | Wt 296.0 lb

## 2020-06-30 DIAGNOSIS — J4540 Moderate persistent asthma, uncomplicated: Secondary | ICD-10-CM | POA: Diagnosis not present

## 2020-06-30 DIAGNOSIS — R0602 Shortness of breath: Secondary | ICD-10-CM | POA: Diagnosis not present

## 2020-06-30 DIAGNOSIS — Z6841 Body Mass Index (BMI) 40.0 and over, adult: Secondary | ICD-10-CM

## 2020-06-30 DIAGNOSIS — G4733 Obstructive sleep apnea (adult) (pediatric): Secondary | ICD-10-CM

## 2020-06-30 MED ORDER — FLUTICASONE-SALMETEROL 500-50 MCG/DOSE IN AEPB: 1.0000 | INHALATION_SPRAY | Freq: Two times a day (BID) | RESPIRATORY_TRACT | 5 refills | Status: DC

## 2020-06-30 NOTE — Progress Notes (Signed)
Synopsis: Referred in January 2022 by Michelle Alar, NP for asthma  Subjective:   PATIENT ID: Michelle Kennedy GENDER: female DOB: Mar 01, 1956, MRN: [id number]   HPI  Chief Complaint  Patient presents with  . Shortness of Breath    Reports increase dyspnea with activity. Requests advice on moving to [name], [place] is a 65 year old woman, never smoker with history of asthma, GERD and OSA who is referred to pulmonary clinic for asthma evaluation.   She has significant shortness of breath with exertion. She reports she struggles to walk around [place] or other stores now and has audible wheezing/dyspnea where others will check on her to make sure she is feeling ok. She has been on Wixella 250-18mcg 1 puff twice daily. She has an albuterol inhaler which she uses 3-4 times per week with relief of her wheezing and dyspnea.   She does have history of esophageal spasms when she was treated for her breast cancer. She feels like she may be having spasms again intermittently.   She had a sleep study done in 2017 with an AHI of 7.8 hour. She was started on CPAP at that time and she has not followed with anyone since. She has gained probably 40lbs since then.   She has tried the weight management clinic in the past and does not want to return for further treatment.   She does have sinus congestion. She has significant allergies, related to grasses and evergreen trees.   She had a positive ANA test in the past. She was thought to have fibromyalgia at one point. She does have pain in her wrists and elbows and tends to feel achy all over.    Past Medical History:  Diagnosis Date  . Abnormally small mouth   . Achilles tendon disorder, right   . Anemia   . Anxiety   . Arthritis    hips and knees  . Asthma    daily inhaler, prn inhaler and neb.  . Asthma due to environmental allergies   . Breast cancer ([place]) 01/2014   left  . Chest pain   . COPD (chronic obstructive  pulmonary disease) ([place])   . Dental bridge present    upper front and lower right  . Dental crowns present    x 3  . Depression   . Esophageal spasm    reports since her chemo and breast surgery  she developed esophageal spasms and reports this is in the past has casue her 02 to desat in the  70s , denies sycnope in relation to this , does report hx of vertigo as well   . Family history of anesthesia complication    twin brother aspirated and died on OR table, per pt.  . Fibromyalgia   . Gallbladder disease   . Gallstones   . GERD (gastroesophageal reflux disease)   . Glaucoma   . H/O blood clots    had blood to  PICC line   . History of gastric ulcer    as a teenager  . History of seizure age 47   as a reaction to Penicillin - no seizures since  . History of stomach ulcers   . History of thyroid cancer    s/p thyroidectomy  . HTN (hypertension)   . Hyperlipidemia   . Hypothyroidism   . Joint pain   . Left knee injury   . Liver disorder    seen when she had her hysterectomy ,  reports she was told it was  " small lesion" ; but asymptomatic   . Lymphedema    left arm  . Migraines   . Multiple food allergies   . Obesity   . OSA (obstructive sleep apnea) 02/16/2016   CPAP  . Palpitations    reports no longer experiences  . Personal history of chemotherapy 2015  . Personal history of radiation therapy 2015   Left  . Prediabetes   . Rheumatoid arthritis ([place])   . SOB (shortness of breath)   . Swelling of both ankles   . Tinnitus   . UTI (lower urinary tract infection) 02/17/2014  . Vertigo    last episode  was 2 weeks ago   . Vitamin D deficiency   . Wears contact lenses    left eye only   . Wears hearing aid in both ears      Family History  Problem Relation Age of Onset  . Alcohol abuse Mother   . Arthritis Mother   . Hypertension Mother   . Bipolar disorder Mother   . Breast cancer Mother 35       unconfirmed  . Lung cancer Mother 73       smoker  .  Hyperlipidemia Mother   . Stroke Mother   . Cancer Mother   . Depression Mother   . Anxiety disorder Mother   . Alcoholism Mother   . Drug abuse Mother   . Eating disorder Mother   . Obesity Mother   . Alcohol abuse Father   . Hyperlipidemia Father   . Kidney disease Father   . Diabetes Father   . Hypertension Father   . Cystic kidney disease Father   . Thyroid disease Father   . Liver disease Father   . Alcoholism Father   . Lung cancer Father   . Arthritis Maternal Grandmother   . Diabetes Paternal Grandmother   . Thyroid cancer Sister 69       type?; currently 25  . Other Sister        ovarian tumor @ 42; TAH/BSO  . Breast cancer Sister   . Thyroid cancer Brother        dx 71s; currently 50  . Breast cancer Maternal Aunt        dx 43s; deceased 51  . Thyroid cancer Paternal Aunt        All 3 paternal aunts with thyroid ca in 30s/40s  . Lung cancer Paternal Aunt        2 of 3 paternal aunts with lung cancer     Social History   Socioeconomic History  . Marital status: Married    Spouse name: Not on file  . Number of children: 2  . Years of education: Not on file  . Highest education level: Not on file  Occupational History  . 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 Smoking status: Never Smoker  . Smokeless tobacco: Never Used  Vaping Use  . Vaping Use: Never used  Substance and Sexual Activity  . Alcohol use: No    Alcohol/week: 0.0 standard drinks  . Drug use: No  . Sexual activity: Not Currently    Partners: Male    Comment: menarche age 34, P 2, first birth age 57, menopause age 64, Premarin x 10 yrs  Other Topics Concern  . Not on file  Social History Narrative   Regular exercise: yes   Right handed  Lives with husband one story home.   Social Determinants of Health   Financial Resource Strain: Not on file  Food Insecurity: Not on file  Transportation Needs: Not on file  Physical Activity: Not on file  Stress:  Not on file  Social Connections: Not on file  Intimate Partner Violence: Not on file     Allergies  Allergen Reactions  . Bee Venom Anaphylaxis  . Codeine Shortness Of Breath and Rash  . Contrast Media [Iodinated Diagnostic Agents] Shortness Of Breath  . Iodine Other (See Comments)    CARDIAC ARREST  . Latex Anaphylaxis and Rash  . Lidocaine Shortness Of Breath and Swelling    SWELLING OF MOUTH AND THROAT, low BP  . Penicillins Shortness Of Breath, Rash and Other (See Comments)    SEIZURE Did it involve swelling of the face/tongue/throat, SOB, or low BP? no Did it involve sudden or severe rash/hives, skin peeling, or any reaction on the inside of your mouth or nose? yes Did you need to seek medical attention at a hospital or doctor's office? yes When did it last happen?2010 If all above answers are "NO", may proceed with cephalosporin use.   Marland Kitchen Pentazocine Lactate Shortness Of Breath and Rash  . Shellfish Allergy Shortness Of Breath and Rash  . Aspirin Rash and Other (See Comments)    GI UPSET  . Erythromycin Swelling and Rash    SWELLING OF JOINTS  . Symbicort [Budesonide-Formoterol Fumarate] Other (See Comments)    BURNING OF TONGUE AND LIPS  . Betadine [Povidone Iodine]     Rash. Breathing problems.   . Compazine [Prochlorperazine Maleate] Rash    Rash on face,chest, arms, back  . Sulfonamide Derivatives Rash     Outpatient Medications Prior to Visit  Medication Sig Dispense Refill  . albuterol (PROVENTIL) (5 MG/ML) 0.5% nebulizer solution Take 0.5 mLs (2.5 mg total) by nebulization every 6 (six) hours as needed for wheezing or shortness of breath. 40 mL 3  . albuterol (VENTOLIN HFA) 108 (90 Base) MCG/ACT inhaler Inhale 2 puffs into the lungs every 6 (six) hours as needed for wheezing or shortness of breath. 18 g 5  . ALPHAGAN P 0.1 % SOLN Place 1 drop into both eyes in the morning and at bedtime.     Marland Kitchen amLODipine (NORVASC) 5 MG tablet Take 1 tablet (5 mg total) by  mouth daily. 90 tablet 1  . anastrozole (ARIMIDEX) 1 MG tablet TAKE 1 TABLET BY MOUTH  DAILY 90 tablet 0  . atorvastatin (LIPITOR) 40 MG tablet TAKE 1 TABLET BY MOUTH  DAILY 90 tablet 0  . CALCIUM PO Take 1 tablet by mouth every evening.    . cholecalciferol (VITAMIN D3) 25 MCG (1000 UNIT) tablet Take 1,000 Units by mouth every evening.     . clopidogrel (PLAVIX) 75 MG tablet Take 1 tablet (75 mg total) by mouth daily. 90 tablet 3  . furosemide (LASIX) 20 MG tablet TAKE 1 TABLET(20 MG) BY MOUTH DAILY AS NEEDED FOR SWELLING 30 tablet 3  . GEMTESA 75 MG TABS Take 1 tablet by mouth daily.    Marland Kitchen levothyroxine (SYNTHROID) 200 MCG tablet TAKE 1 TABLET BY MOUTH  DAILY BEFORE BREAKFAST 90 tablet 1  . lisinopril (ZESTRIL) 20 MG tablet Take 2 tablets (40 mg total) by mouth daily. 135 tablet 1  . meloxicam (MOBIC) 7.5 MG tablet Take 7.5 mg by mouth daily.    . methocarbamol (ROBAXIN) 500 MG tablet Take 1 tablet (500 mg total) by  mouth every 8 (eight) hours as needed for muscle spasms. 60 tablet 1  . Multiple Vitamin (MULTIVITAMIN ADULT) TABS Take 1 tablet by mouth daily.    . Multiple Vitamins-Minerals (OCUVITE PO) Take 1 tablet by mouth daily.    Marland Kitchen MYRBETRIQ 50 MG TB24 tablet TAKE 1 TABLET BY MOUTH  DAILY 90 tablet 1  . Omega-3 Fatty Acids (FISH OIL) 1000 MG CAPS Take 1 capsule by mouth every evening.     . pantoprazole (PROTONIX) 40 MG tablet Take 1 tablet (40 mg total) by mouth daily. 90 tablet 1  . traMADol (ULTRAM) 50 MG tablet Take 1 tablet (50 mg total) by mouth every 6 (six) hours as needed. 30 tablet 0  . Fluticasone-Salmeterol (WIXELA INHUB) 250-50 MCG/DOSE AEPB Inhale 1 puff into the lungs 2 (two) times daily. 180 each 3   No facility-administered medications prior to visit.    Review of Systems  Constitutional: Negative for chills, fever, malaise/fatigue and weight loss.  HENT: Positive for congestion. Negative for sinus pain and sore throat.   Eyes: Negative.   Respiratory: Positive for  shortness of breath and wheezing. Negative for cough, hemoptysis and sputum production.   Cardiovascular: Negative for chest pain, palpitations, orthopnea, claudication and leg swelling.  Gastrointestinal: Negative for abdominal pain, heartburn, nausea and vomiting.  Genitourinary: Negative.   Musculoskeletal: Negative for joint pain and myalgias.  Skin: Negative for rash.  Neurological: Negative for weakness.  Endo/Heme/Allergies: Negative.   Psychiatric/Behavioral: Negative.       Objective:   Vitals:   06/30/20 0938  BP: 138/74  Pulse: 88  Temp: (!) 97.4 F (36.3 C)  SpO2: 98%  Weight: 296 lb (134.3 kg)  Height: 5\' 7"  (1.702 m)    Physical Exam Constitutional:      General: She is not in acute distress.    Appearance: She is obese. She is not ill-appearing.  HENT:     Head: Normocephalic and atraumatic.  Eyes:     General: No scleral icterus.    Conjunctiva/sclera: Conjunctivae normal.     Pupils: Pupils are equal, round, and reactive to light.  Cardiovascular:     Rate and Rhythm: Normal rate and regular rhythm.     Pulses: Normal pulses.     Heart sounds: Normal heart sounds. No murmur heard.   Pulmonary:     Effort: Pulmonary effort is normal.     Breath sounds: Normal breath sounds. No wheezing, rhonchi or rales.  Abdominal:     General: Bowel sounds are normal.     Palpations: Abdomen is soft.  Musculoskeletal:     Right lower leg: No edema.     Left lower leg: No edema.  Lymphadenopathy:     Cervical: No cervical adenopathy.  Skin:    General: Skin is warm and dry.  Neurological:     General: No focal deficit present.     Mental Status: She is alert.  Psychiatric:        Mood and Affect: Mood normal.        Behavior: Behavior normal.        Thought Content: Thought content normal.        Judgment: Judgment normal.    CBC    Component Value Date/Time   WBC 7.4 12/30/2019 1235   RBC 5.88 (H) 12/30/2019 1235   HGB 14.5 12/30/2019 1235   HGB  13.7 01/23/2017 1147   HCT 46.1 (H) 12/30/2019 1235   HCT 41.4 01/23/2017 1147   PLT  294 12/30/2019 1235   PLT 266 01/23/2017 1147   MCV 78.4 (L) 12/30/2019 1235   MCV 82.8 01/23/2017 1147   MCH 24.7 (L) 12/30/2019 1235   MCHC 31.5 12/30/2019 1235   RDW 16.3 (H) 12/30/2019 1235   RDW 15.8 (H) 01/23/2017 1147   LYMPHSABS 1.8 12/30/2019 1235   LYMPHSABS 1.2 01/23/2017 1147   MONOABS 0.6 12/30/2019 1235   MONOABS 0.5 01/23/2017 1147   EOSABS 0.1 12/30/2019 1235   EOSABS 0.2 01/23/2017 1147   EOSABS 0.0 09/20/2016 1045   BASOSABS 0.0 12/30/2019 1235   BASOSABS 0.1 01/23/2017 1147   BMP Latest Ref Rng & Units 06/28/2020 03/15/2020 03/01/2020  Glucose 70 - 99 mg/dL 82 110(H) 98  BUN 6 - 23 mg/dL 12 13 14   Creatinine 0.40 - 1.20 mg/dL 0.83 0.87 0.88  BUN/Creat Ratio 6 - 22 (calc) - NOT APPLICABLE NOT APPLICABLE  Sodium 454 - 145 mEq/L 140 141 141  Potassium 3.5 - 5.1 mEq/L 4.4 4.6 4.3  Chloride 96 - 112 mEq/L 103 104 105  CO2 19 - 32 mEq/L 31 29 27   Calcium 8.4 - 10.5 mg/dL 9.7 10.2 9.8   Labs: 2017: ANA + 1:160, homogenous pattern  Chest imaging: CXR 12/30/19  Stable mild scarring in the lingula. No edema or airspace opacity. Heart upper normal in size. No adenopathy evident. Prior comparison 2020.  PFT: No flowsheet data found.  Spriometry 2014: FEV1/FVC 77 FEV1 2.4L (83%) FVC 3.1L (85%)  Echo 08/23/2019: 1. Left ventricular ejection fraction, by estimation, is 55 to 60%. The  left ventricle has normal function. The left ventricle has no regional  wall motion abnormalities. There is moderate left ventricular hypertrophy.  Left ventricular diastolic  parameters are consistent with Grade I diastolic dysfunction (impaired  relaxation). No apical thrombus seen with Definity contrast.  2. Right ventricular systolic function was not well visualized. The right  ventricular size is not well visualized.  3. The mitral valve is grossly normal. No evidence of mitral valve   regurgitation.  4. The aortic valve was not well visualized. Aortic valve regurgitation  is not visualized. No aortic stenosis is present.  5. The inferior vena cava is normal in size with greater than 50%  respiratory variability, suggesting right atrial pressure of 3 mmHg.  Home Sleep Study 2017:  AHI 7.9/hour    Assessment & Plan:   Moderate persistent asthma without complication  OSA (obstructive sleep apnea) - Plan: Split night study  Class 3 severe obesity due to excess calories with serious comorbidity and body mass index (BMI) of 45.0 to 49.9 in adult [facility])  Shortness of breath - Plan: CT Chest High Resolution  Discussion: Michelle Kennedy is a 65 year old woman, never smoker with history of asthma, GERD and OSA who is referred to pulmonary clinic for asthma evaluation.  We will increase her wixella to 500-46mcg 1 puff twice daily. She can continue albuterol as needed.   We will check a HRCT chest for the inspiratory and expiratory phases to check for excessive dynamic airway compression which could be leading to her exertional symptoms. If she does not have signs of EDAC then we will continue to titrate her inhaler therapy.   We will schedule her for split night sleep study to make sure her sleep apnea is adequately treated.   Encouraged her to work on weight loss and slowly increase her physical activity level.   In regards to planning for colonoscopy, I would hold off on this procedure  until we have the CT chest and sleep studies completed.   Follow up in 2 months.  >60 minutes was spent reviewing records, direct patient care and completing documentation.   Michelle Jackson, MD [facility] Office: [phone]   See Amion for Pager Details    Current Outpatient Medications:  .  albuterol (PROVENTIL) (5 MG/ML) 0.5% nebulizer solution, Take 0.5 mLs (2.5 mg total) by nebulization every 6 (six) hours as needed for wheezing or shortness of  breath., Disp: 40 mL, Rfl: 3 .  albuterol (VENTOLIN HFA) 108 (90 Base) MCG/ACT inhaler, Inhale 2 puffs into the lungs every 6 (six) hours as needed for wheezing or shortness of breath., Disp: 18 g, Rfl: 5 .  ALPHAGAN P 0.1 % SOLN, Place 1 drop into both eyes in the morning and at bedtime. , Disp: , Rfl:  .  amLODipine (NORVASC) 5 MG tablet, Take 1 tablet (5 mg total) by mouth daily., Disp: 90 tablet, Rfl: 1 .  anastrozole (ARIMIDEX) 1 MG tablet, TAKE 1 TABLET BY MOUTH  DAILY, Disp: 90 tablet, Rfl: 0 .  atorvastatin (LIPITOR) 40 MG tablet, TAKE 1 TABLET BY MOUTH  DAILY, Disp: 90 tablet, Rfl: 0 .  CALCIUM PO, Take 1 tablet by mouth every evening., Disp: , Rfl:  .  cholecalciferol (VITAMIN D3) 25 MCG (1000 UNIT) tablet, Take 1,000 Units by mouth every evening. , Disp: , Rfl:  .  clopidogrel (PLAVIX) 75 MG tablet, Take 1 tablet (75 mg total) by mouth daily., Disp: 90 tablet, Rfl: 3 .  Fluticasone-Salmeterol (WIXELA INHUB) 500-50 MCG/DOSE AEPB, Inhale 1 puff into the lungs 2 (two) times daily., Disp: 60 each, Rfl: 5 .  furosemide (LASIX) 20 MG tablet, TAKE 1 TABLET(20 MG) BY MOUTH DAILY AS NEEDED FOR SWELLING, Disp: 30 tablet, Rfl: 3 .  GEMTESA 75 MG TABS, Take 1 tablet by mouth daily., Disp: , Rfl:  .  levothyroxine (SYNTHROID) 200 MCG tablet, TAKE 1 TABLET BY MOUTH  DAILY BEFORE BREAKFAST, Disp: 90 tablet, Rfl: 1 .  lisinopril (ZESTRIL) 20 MG tablet, Take 2 tablets (40 mg total) by mouth daily., Disp: 135 tablet, Rfl: 1 .  meloxicam (MOBIC) 7.5 MG tablet, Take 7.5 mg by mouth daily., Disp: , Rfl:  .  methocarbamol (ROBAXIN) 500 MG tablet, Take 1 tablet (500 mg total) by mouth every 8 (eight) hours as needed for muscle spasms., Disp: 60 tablet, Rfl: 1 .  Multiple Vitamin (MULTIVITAMIN ADULT) TABS, Take 1 tablet by mouth daily., Disp: , Rfl:  .  Multiple Vitamins-Minerals (OCUVITE PO), Take 1 tablet by mouth daily., Disp: , Rfl:  .  MYRBETRIQ 50 MG TB24 tablet, TAKE 1 TABLET BY MOUTH  DAILY, Disp: 90  tablet, Rfl: 1 .  Omega-3 Fatty Acids (FISH OIL) 1000 MG CAPS, Take 1 capsule by mouth every evening. , Disp: , Rfl:  .  pantoprazole (PROTONIX) 40 MG tablet, Take 1 tablet (40 mg total) by mouth daily., Disp: 90 tablet, Rfl: 1 .  traMADol (ULTRAM) 50 MG tablet, Take 1 tablet (50 mg total) by mouth every 6 (six) hours as needed., Disp: 30 tablet, Rfl: 0

## 2020-06-30 NOTE — Patient Instructions (Addendum)
We will schedule you for split night sleep study for Obstructive Sleep Apnea  We will schedule you for a CT Chest scan to check for excessive dynamic airway compression  We will increase your Wixella to 500-65mcg 1 puff twice daily  Use albuterol 1-2 puffs every 4-6 hours as needed  Continue to work on weight loss

## 2020-07-07 ENCOUNTER — Ambulatory Visit (HOSPITAL_BASED_OUTPATIENT_CLINIC_OR_DEPARTMENT_OTHER)
Admission: RE | Admit: 2020-07-07 | Discharge: 2020-07-07 | Disposition: A | Discharge status: Home / Self Care | Payer: 59 | Source: Ambulatory Visit | Attending: Pulmonary Disease | Admitting: Pulmonary Disease

## 2020-07-07 ENCOUNTER — Other Ambulatory Visit: Payer: Self-pay

## 2020-07-07 DIAGNOSIS — R0602 Shortness of breath: Secondary | ICD-10-CM | POA: Diagnosis present

## 2020-07-07 MED ORDER — ATORVASTATIN CALCIUM 40 MG PO TABS: 40.0000 mg | ORAL_TABLET | Freq: Every day | ORAL | 0 refills | Status: DC

## 2020-07-20 ENCOUNTER — Ambulatory Visit (HOSPITAL_BASED_OUTPATIENT_CLINIC_OR_DEPARTMENT_OTHER): Payer: 59 | Attending: Pulmonary Disease | Admitting: Pulmonary Disease

## 2020-07-20 ENCOUNTER — Other Ambulatory Visit: Payer: Self-pay | Admitting: *Deleted

## 2020-07-20 ENCOUNTER — Other Ambulatory Visit: Payer: Self-pay

## 2020-07-20 DIAGNOSIS — G4733 Obstructive sleep apnea (adult) (pediatric): Secondary | ICD-10-CM | POA: Insufficient documentation

## 2020-07-20 MED ORDER — ANASTROZOLE 1 MG PO TABS: 1.0000 mg | ORAL_TABLET | Freq: Every day | ORAL | 2 refills | Status: DC

## 2020-07-22 DIAGNOSIS — G4733 Obstructive sleep apnea (adult) (pediatric): Secondary | ICD-10-CM | POA: Diagnosis not present

## 2020-07-22 NOTE — Procedures (Signed)
    Patient Name: Michelle Kennedy, Michelle Kennedy Date: 07/20/2020 Gender: Female D.O.B: 09-22-1955 Age (years): 65 Referring Provider: Freda Jackson Height (inches): 52 Interpreting Physician: Chesley Mires MD, ABSM Weight (lbs): 290 RPSGT: Carolin Coy BMI: 49 MRN: [id number] Neck Size: 16.00  CLINICAL INFORMATION Sleep Study Type: NPSG  Indication for sleep study: Hypertension, Obesity. Home sleep study from February 02, 2016 showed AHI 7.9.  Epworth Sleepiness Score: 7  SLEEP STUDY TECHNIQUE As per the AASM Manual for the Scoring of Sleep and Associated Events v2.3 (April 2016) with a hypopnea requiring 4% desaturations.  The channels recorded and monitored were frontal, central and occipital EEG, electrooculogram (EOG), submentalis EMG (chin), nasal and oral airflow, thoracic and abdominal wall motion, anterior tibialis EMG, snore microphone, electrocardiogram, and pulse oximetry.  MEDICATIONS Medications self-administered by patient taken the night of the study : N/A  SLEEP ARCHITECTURE The study was initiated at 10:08:08 PM and ended at 4:24:34 AM.  Sleep onset time was 26.9 minutes and the sleep efficiency was 85.1%%. The total sleep time was 320.5 minutes.  Stage REM latency was 168.5 minutes.  The patient spent 5.9%% of the night in stage N1 sleep, 79.9%% in stage N2 sleep, 0.5%% in stage N3 and 13.7% in REM.  Alpha intrusion was absent.  Supine sleep was 26.49%.  RESPIRATORY PARAMETERS The overall apnea/hypopnea index (AHI) was 6.9 per hour. There were 2 total apneas, including 0 obstructive, 2 central and 0 mixed apneas. There were 35 hypopneas and 95 RERAs.  The AHI during Stage REM sleep was 40.9 per hour.  AHI while supine was 15.6 per hour.  The mean oxygen saturation was 91.3%. The minimum SpO2 during sleep was 85.0%.  moderate snoring was noted during this study.  CARDIAC DATA The 2 lead EKG demonstrated sinus rhythm. The mean heart rate was 80.8  beats per minute. Other EKG findings include: None.  LEG MOVEMENT DATA The total PLMS were 0 with a resulting PLMS index of 0.0. Associated arousal with leg movement index was 0.2 .  IMPRESSIONS - Mild obstructive sleep apnea with an AHI of 6.9 and SpO2 low of 85%.  She had a significant REM and positional effect to her sleep apnea. - The patient snored with moderate snoring volume.  DIAGNOSIS - Obstructive Sleep Apnea (G47.33)  RECOMMENDATIONS - Additional therapies options include positional therapy, CPAP, oral appliance, or surgical assessment. - Avoid alcohol, sedatives and other CNS depressants that may worsen sleep apnea and disrupt normal sleep architecture. - Sleep hygiene should be reviewed to assess factors that may improve sleep quality. - Weight management and regular exercise should be initiated or continued if appropriate.  [Electronically signed] 07/22/2020 10:04 AM  Chesley Mires MD, ABSM Diplomate, American Board of Sleep Medicine   NPI: [id number]

## 2020-07-23 ENCOUNTER — Telehealth: Payer: Self-pay | Admitting: Pulmonary Disease

## 2020-07-23 NOTE — Telephone Encounter (Signed)
Please let the patient know she has mild obstructive sleep apnea. If she would like to consider CPAP therapy then I would recommend she meet with one of our sleep specialists to discuss treatment.   Thanks, Wille Glaser

## 2020-07-24 ENCOUNTER — Encounter: Payer: Self-pay | Admitting: Family

## 2020-07-26 ENCOUNTER — Telehealth (INDEPENDENT_AMBULATORY_CARE_PROVIDER_SITE_OTHER): Payer: 59 | Admitting: Family

## 2020-07-26 ENCOUNTER — Encounter: Payer: Self-pay | Admitting: Family

## 2020-07-26 ENCOUNTER — Other Ambulatory Visit: Payer: Self-pay

## 2020-07-26 VITALS — HR 101 | Temp 97.0°F

## 2020-07-26 DIAGNOSIS — J45901 Unspecified asthma with (acute) exacerbation: Secondary | ICD-10-CM

## 2020-07-26 DIAGNOSIS — B349 Viral infection, unspecified: Secondary | ICD-10-CM | POA: Diagnosis not present

## 2020-07-26 MED ORDER — PREDNISONE 10 MG PO TABS: ORAL_TABLET | ORAL | 0 refills | Status: DC

## 2020-07-26 MED ORDER — ALBUTEROL SULFATE HFA 108 (90 BASE) MCG/ACT IN AERS: 2.0000 | INHALATION_SPRAY | Freq: Four times a day (QID) | RESPIRATORY_TRACT | 1 refills | Status: DC | PRN

## 2020-07-26 NOTE — Progress Notes (Signed)
Virtual Visit via Video Note  I connected with Michelle Kennedy on 07/26/20 at 11:00 AM EST by a video enabled telemedicine application and verified that I am speaking with the correct person using two identifiers.  Location: Patient: home Provider: work   I discussed the limitations of evaluation and management by telemedicine and the availability of in person appointments. The patient expressed understanding and agreed to proceed. Only the patient and myself were present for today's video call.   History of Present Illness:  Fatigue/HA began on 3/5. Has HA, nausea, diarrhea (light to heavy).  BP 142/77, pulse is 101 oxygen is 98%.  She reports increased wheezing.  She has moved her bowels 7x yesterday and 4 times today.  She denies fever.  Using rescue inhaler 3x a day.     Observations/Objective:   Gen: Awake, alert, no acute distress Resp: Breathing is even and non-labored Psych: calm/pleasant demeanor Neuro: Alert and Oriented x 3, + facial symmetry, speech is clear.   Assessment and Plan:  Acute viral illness-I have advised the patient to obtain a rapid Covid test and perform at home.  We discussed Covid quarantine precautions in case it is positive.  I also advised her to let me know if her Covid test is positive as I would plan a referral to our Covid infusion clinic.  In regards to her diarrhea, I encouraged her to increase her water intake and decrease her intake of green tea.  Her heart rate is up slightly, and I suspect she is mildly dehydrated.  We discussed this.  I advised her to begin Imodium as needed.  Acute asthma exacerbation-she notes increased wheezing, patient was given a prednisone taper today.  Continue albuterol as needed.  If she does test positive for Covid, I will plan empiric treatment with azithromycin.  Will await results from patient.   Follow Up Instructions:    I discussed the assessment and treatment plan with the patient. The patient was provided  an opportunity to ask questions and all were answered. The patient agreed with the plan and demonstrated an understanding of the instructions.   The patient was advised to call back or seek an in-person evaluation if the symptoms worsen or if the condition fails to improve as anticipated.  Nance Pear, NP

## 2020-07-28 ENCOUNTER — Other Ambulatory Visit: Payer: Self-pay | Admitting: Family

## 2020-07-28 NOTE — Telephone Encounter (Signed)
Note from pharmacy:    Patient has indicated to [place] that s/he is either allergic or sensitive to (sympathomimetice). There is possible cross-allergy. May we dispense? Please respond with appropriate changes or comment to Pharmacy.  Patient has indicated to [place] that s/he is either allergic or sensitive to (sympathomimetice). There is possible cross-allergy. May we dispense? Please respond with appropriate changes or comment to Pharmacy.

## 2020-07-30 ENCOUNTER — Encounter: Payer: Self-pay | Admitting: Pulmonary Disease

## 2020-07-30 ENCOUNTER — Other Ambulatory Visit: Payer: Self-pay

## 2020-07-30 ENCOUNTER — Telehealth: Payer: Self-pay | Admitting: Family

## 2020-07-30 ENCOUNTER — Ambulatory Visit (INDEPENDENT_AMBULATORY_CARE_PROVIDER_SITE_OTHER): Payer: 59 | Admitting: Pulmonary Disease

## 2020-07-30 VITALS — BP 138/82 | HR 82 | Ht 67.0 in | Wt 293.2 lb

## 2020-07-30 DIAGNOSIS — Z6841 Body Mass Index (BMI) 40.0 and over, adult: Secondary | ICD-10-CM | POA: Diagnosis not present

## 2020-07-30 DIAGNOSIS — J4540 Moderate persistent asthma, uncomplicated: Secondary | ICD-10-CM | POA: Diagnosis not present

## 2020-07-30 DIAGNOSIS — G4733 Obstructive sleep apnea (adult) (pediatric): Secondary | ICD-10-CM | POA: Diagnosis not present

## 2020-07-30 NOTE — Progress Notes (Signed)
Synopsis: Referred in January 2022 by Debbrah Alar, NP for asthma  Subjective:   PATIENT ID: Michelle Kennedy GENDER: female DOB: June 10, 1955, MRN: [id number]  HPI  Chief Complaint  Patient presents with  . Follow-up    CT 07/07/20. Needs results of Sleep study. Using wixela, reports she does feel it helps. PCP placed her on prednisone due to contact with some mold. She has 2 more days on prednisone.    Michelle Kennedy is a 65 year old woman, never smoker with history of asthma, GERD and OSA returns to pulmonary clinic for asthma follow up.   She has been doing well since last visit with the increase in [place] from 250-40mcg 1 puff twice daily to 500-55mcg 1 puff twice daily. She is now walking outside throughout the week for exercise.   She had an inlab sleep study performed 07/20/20 which showed an overall AHI of 6.9/hr with a REM AHI 40.9/hr and supine AHI 15.6/hr. She continues to use her home CPAP auto-titrating machine.   HRCT Chest 07/07/20 does not indicate tracheobronchomalacia or parenchymal issues.   OV 06/30/20: She has significant shortness of breath with exertion. She reports she struggles to walk around [place] or other stores now and has audible wheezing/dyspnea where others will check on her to make sure she is feeling ok. She has been on Wixella 250-65mcg 1 puff twice daily. She has an albuterol inhaler which she uses 3-4 times per week with relief of her wheezing and dyspnea.   She does have history of esophageal spasms when she was treated for her breast cancer. She feels like she may be having spasms again intermittently.   She had a sleep study done in 2017 with an AHI of 7.8 hour. She was started on CPAP at that time and she has not followed with anyone since. She has gained probably 40lbs since then.   She has tried the weight management clinic in the past and does not want to return for further treatment.   She does have sinus congestion. She has significant  allergies, related to grasses and evergreen trees.   She had a positive ANA test in the past. She was thought to have fibromyalgia at one point. She does have pain in her wrists and elbows and tends to feel achy all over.    Past Medical History:  Diagnosis Date  . Abnormally small mouth   . Achilles tendon disorder, right   . Anemia   . Anxiety   . Arthritis    hips and knees  . Asthma    daily inhaler, prn inhaler and neb.  . Asthma due to environmental allergies   . Breast cancer ([place]) 01/2014   left  . Chest pain   . COPD (chronic obstructive pulmonary disease) ([place])   . Dental bridge present    upper front and lower right  . Dental crowns present    x 3  . Depression   . Esophageal spasm    reports since her chemo and breast surgery  she developed esophageal spasms and reports this is in the past has casue her 02 to desat in the  70s , denies sycnope in relation to this , does report hx of vertigo as well   . Family history of anesthesia complication    twin brother aspirated and died on OR table, per pt.  . Fibromyalgia   . Gallbladder disease   . Gallstones   . GERD (gastroesophageal reflux disease)   .  Glaucoma   . H/O blood clots    had blood to  PICC line   . History of gastric ulcer    as a teenager  . History of seizure age 65   as a reaction to Penicillin - no seizures since  . History of stomach ulcers   . History of thyroid cancer    s/p thyroidectomy  . HTN (hypertension)   . Hyperlipidemia   . Hypothyroidism   . Joint pain   . Left knee injury   . Liver disorder    seen when she had her hysterectomy , reports she was told it was  " small lesion" ; but asymptomatic   . Lymphedema    left arm  . Migraines   . Multiple food allergies   . Obesity   . OSA (obstructive sleep apnea) 02/16/2016   CPAP  . Palpitations    reports no longer experiences  . Personal history of chemotherapy 2015  . Personal history of radiation therapy 2015   Left  .  Prediabetes   . Rheumatoid arthritis ([place])   . SOB (shortness of breath)   . Swelling of both ankles   . Tinnitus   . UTI (lower urinary tract infection) 02/17/2014  . Vertigo    last episode  was 2 weeks ago   . Vitamin D deficiency   . Wears contact lenses    left eye only   . Wears hearing aid in both ears      Family History  Problem Relation Age of Onset  . Alcohol abuse Mother   . Arthritis Mother   . Hypertension Mother   . Bipolar disorder Mother   . Breast cancer Mother 66       unconfirmed  . Lung cancer Mother 39       smoker  . Hyperlipidemia Mother   . Stroke Mother   . Cancer Mother   . Depression Mother   . Anxiety disorder Mother   . Alcoholism Mother   . Drug abuse Mother   . Eating disorder Mother   . Obesity Mother   . Alcohol abuse Father   . Hyperlipidemia Father   . Kidney disease Father   . Diabetes Father   . Hypertension Father   . Cystic kidney disease Father   . Thyroid disease Father   . Liver disease Father   . Alcoholism Father   . Lung cancer Father   . Arthritis Maternal Grandmother   . Diabetes Paternal Grandmother   . Thyroid cancer Sister 68       type?; currently 29  . Other Sister        ovarian tumor @ 12; TAH/BSO  . Breast cancer Sister   . Thyroid cancer Brother        dx 32s; currently 102  . Breast cancer Maternal Aunt        dx 78s; deceased 34  . Thyroid cancer Paternal Aunt        All 3 paternal aunts with thyroid ca in 30s/40s  . Lung cancer Paternal Aunt        2 of 3 paternal aunts with lung cancer     Social History   Socioeconomic History  . Marital status: Married    Spouse name: Not on file  . Number of children: 2  . Years of education: Not on file  . Highest education level: Not on file  Occupational History  . 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 Smoking status: Never Smoker  . Smokeless tobacco: Never Used  Vaping Use  . Vaping Use: Never used  Substance and  Sexual Activity  . Alcohol use: No    Alcohol/week: 0.0 standard drinks  . Drug use: No  . Sexual activity: Not Currently    Partners: Male    Comment: menarche age 47, P 2, first birth age 64, menopause age 21, Premarin x 10 yrs  Other Topics Concern  . Not on file  Social History Narrative   Regular exercise: yes   Right handed    Lives with husband one story home.   Social Determinants of Health   Financial Resource Strain: Not on file  Food Insecurity: Not on file  Transportation Needs: Not on file  Physical Activity: Not on file  Stress: Not on file  Social Connections: Not on file  Intimate Partner Violence: Not on file     Allergies  Allergen Reactions  . Bee Venom Anaphylaxis  . Codeine Shortness Of Breath and Rash  . Contrast Media [Iodinated Diagnostic Agents] Shortness Of Breath  . Iodine Other (See Comments)    CARDIAC ARREST  . Latex Anaphylaxis and Rash  . Lidocaine Shortness Of Breath and Swelling    SWELLING OF MOUTH AND THROAT, low BP  . Penicillins Shortness Of Breath, Rash and Other (See Comments)    SEIZURE Did it involve swelling of the face/tongue/throat, SOB, or low BP? no Did it involve sudden or severe rash/hives, skin peeling, or any reaction on the inside of your mouth or nose? yes Did you need to seek medical attention at a hospital or doctor's office? yes When did it last happen?2010 If all above answers are "NO", may proceed with cephalosporin use.   Marland Kitchen Pentazocine Lactate Shortness Of Breath and Rash  . Shellfish Allergy Shortness Of Breath and Rash  . Aspirin Rash and Other (See Comments)    GI UPSET  . Erythromycin Swelling and Rash    SWELLING OF JOINTS  . Symbicort [Budesonide-Formoterol Fumarate] Other (See Comments)    BURNING OF TONGUE AND LIPS  . Betadine [Povidone Iodine]     Rash. Breathing problems.   . Compazine [Prochlorperazine Maleate] Rash    Rash on face,chest, arms, back  . Sulfonamide Derivatives Rash      Outpatient Medications Prior to Visit  Medication Sig Dispense Refill  . albuterol (PROVENTIL) (5 MG/ML) 0.5% nebulizer solution Take 0.5 mLs (2.5 mg total) by nebulization every 6 (six) hours as needed for wheezing or shortness of breath. 40 mL 3  . albuterol (VENTOLIN HFA) 108 (90 Base) MCG/ACT inhaler Inhale 2 puffs into the lungs every 6 (six) hours as needed for wheezing or shortness of breath. 48 g 1  . ALPHAGAN P 0.1 % SOLN Place 1 drop into both eyes in the morning and at bedtime.     Marland Kitchen amLODipine (NORVASC) 5 MG tablet Take 1 tablet (5 mg total) by mouth daily. 90 tablet 1  . anastrozole (ARIMIDEX) 1 MG tablet Take 1 tablet (1 mg total) by mouth daily. 90 tablet 2  . atorvastatin (LIPITOR) 40 MG tablet Take 1 tablet (40 mg total) by mouth daily. 90 tablet 0  . CALCIUM PO Take 1 tablet by mouth every evening.    . cholecalciferol (VITAMIN D3) 25 MCG (1000 UNIT) tablet Take 1,000 Units by mouth every evening.     . clopidogrel (PLAVIX) 75 MG tablet Take 1 tablet (75 mg total)  by mouth daily. 90 tablet 3  . Fluticasone-Salmeterol (WIXELA INHUB) 500-50 MCG/DOSE AEPB Inhale 1 puff into the lungs 2 (two) times daily. 60 each 5  . furosemide (LASIX) 20 MG tablet TAKE 1 TABLET(20 MG) BY MOUTH DAILY AS NEEDED FOR SWELLING 30 tablet 3  . GEMTESA 75 MG TABS Take 1 tablet by mouth daily.    Marland Kitchen levothyroxine (SYNTHROID) 200 MCG tablet TAKE 1 TABLET BY MOUTH  DAILY BEFORE BREAKFAST 90 tablet 1  . lisinopril (ZESTRIL) 20 MG tablet Take 2 tablets (40 mg total) by mouth daily. 135 tablet 1  . meloxicam (MOBIC) 7.5 MG tablet Take 7.5 mg by mouth daily.    . methocarbamol (ROBAXIN) 500 MG tablet Take 1 tablet (500 mg total) by mouth every 8 (eight) hours as needed for muscle spasms. 60 tablet 1  . Multiple Vitamin (MULTIVITAMIN ADULT) TABS Take 1 tablet by mouth daily.    . Multiple Vitamins-Minerals (OCUVITE PO) Take 1 tablet by mouth daily.    Marland Kitchen MYRBETRIQ 50 MG TB24 tablet TAKE 1 TABLET BY MOUTH  DAILY  90 tablet 1  . Omega-3 Fatty Acids (FISH OIL) 1000 MG CAPS Take 1 capsule by mouth every evening.     . pantoprazole (PROTONIX) 40 MG tablet Take 1 tablet (40 mg total) by mouth daily. 90 tablet 1  . predniSONE (DELTASONE) 10 MG tablet 4 tabs by mouth once daily for 2 days, then 3 tabs daily x 2 days, then 2 tabs daily x 2 days, then 1 tab daily x 2 days 20 tablet 0  . traMADol (ULTRAM) 50 MG tablet Take 1 tablet (50 mg total) by mouth every 6 (six) hours as needed. 30 tablet 0   No facility-administered medications prior to visit.    Review of Systems  Constitutional: Negative for chills, fever, malaise/fatigue and weight loss.  HENT: Negative for congestion, sinus pain and sore throat.   Eyes: Negative.   Respiratory: Negative for cough, hemoptysis, sputum production, shortness of breath and wheezing.   Cardiovascular: Negative for chest pain, palpitations, orthopnea, claudication and leg swelling.  Gastrointestinal: Negative for abdominal pain, heartburn, nausea and vomiting.  Genitourinary: Negative.   Musculoskeletal: Negative for joint pain and myalgias.  Skin: Negative for rash.  Neurological: Negative for weakness.  Endo/Heme/Allergies: Negative.   Psychiatric/Behavioral: Negative.       Objective:   Vitals:   07/30/20 1154  BP: 138/82  Pulse: 82  SpO2: 97%  Weight: 293 lb 3.2 oz (133 kg)  Height: 5\' 7"  (1.702 m)    Physical Exam Constitutional:      General: She is not in acute distress.    Appearance: She is obese. She is not ill-appearing.  HENT:     Head: Normocephalic and atraumatic.  Eyes:     General: No scleral icterus.    Conjunctiva/sclera: Conjunctivae normal.     Pupils: Pupils are equal, round, and reactive to light.  Cardiovascular:     Rate and Rhythm: Normal rate and regular rhythm.     Pulses: Normal pulses.     Heart sounds: Normal heart sounds. No murmur heard.   Pulmonary:     Effort: Pulmonary effort is normal.     Breath sounds:  Normal breath sounds. No wheezing, rhonchi or rales.  Abdominal:     General: Bowel sounds are normal.     Palpations: Abdomen is soft.  Musculoskeletal:     Right lower leg: No edema.     Left lower leg: No edema.  Lymphadenopathy:     Cervical: No cervical adenopathy.  Skin:    General: Skin is warm and dry.  Neurological:     General: No focal deficit present.     Mental Status: She is alert.  Psychiatric:        Mood and Affect: Mood normal.        Behavior: Behavior normal.        Thought Content: Thought content normal.        Judgment: Judgment normal.    CBC    Component Value Date/Time   WBC 7.4 12/30/2019 1235   RBC 5.88 (H) 12/30/2019 1235   HGB 14.5 12/30/2019 1235   HGB 13.7 01/23/2017 1147   HCT 46.1 (H) 12/30/2019 1235   HCT 41.4 01/23/2017 1147   PLT 294 12/30/2019 1235   PLT 266 01/23/2017 1147   MCV 78.4 (L) 12/30/2019 1235   MCV 82.8 01/23/2017 1147   MCH 24.7 (L) 12/30/2019 1235   MCHC 31.5 12/30/2019 1235   RDW 16.3 (H) 12/30/2019 1235   RDW 15.8 (H) 01/23/2017 1147   LYMPHSABS 1.8 12/30/2019 1235   LYMPHSABS 1.2 01/23/2017 1147   MONOABS 0.6 12/30/2019 1235   MONOABS 0.5 01/23/2017 1147   EOSABS 0.1 12/30/2019 1235   EOSABS 0.2 01/23/2017 1147   EOSABS 0.0 09/20/2016 1045   BASOSABS 0.0 12/30/2019 1235   BASOSABS 0.1 01/23/2017 1147   BMP Latest Ref Rng & Units 06/28/2020 03/15/2020 03/01/2020  Glucose 70 - 99 mg/dL 82 110(H) 98  BUN 6 - 23 mg/dL 12 13 14   Creatinine 0.40 - 1.20 mg/dL 0.83 0.87 0.88  BUN/Creat Ratio 6 - 22 (calc) - NOT APPLICABLE NOT APPLICABLE  Sodium 875 - 145 mEq/L 140 141 141  Potassium 3.5 - 5.1 mEq/L 4.4 4.6 4.3  Chloride 96 - 112 mEq/L 103 104 105  CO2 19 - 32 mEq/L 31 29 27   Calcium 8.4 - 10.5 mg/dL 9.7 10.2 9.8   Labs: 2017: ANA + 1:160, homogenous pattern  Chest imaging: HRCT Chest 07/07/2020 1. No evidence of tracheobronchomalacia. 2. No evidence of interstitial lung disease. Minimal radiation fibrosis in  the anterior left upper lobe. 3. Nonspecific mild diffuse bronchial wall thickening, as can be seen with chronic bronchitis or reactive airways disease. 4. Diffuse hepatic steatosis.  CXR 12/30/19  Stable mild scarring in the lingula. No edema or airspace opacity. Heart upper normal in size. No adenopathy evident. Prior comparison 2020.  PFT: No flowsheet data found.  Spriometry 2014: FEV1/FVC 77 FEV1 2.4L (83%) FVC 3.1L (85%)  Echo 08/23/2019: 1. Left ventricular ejection fraction, by estimation, is 55 to 60%. The  left ventricle has normal function. The left ventricle has no regional  wall motion abnormalities. There is moderate left ventricular hypertrophy.  Left ventricular diastolic  parameters are consistent with Grade I diastolic dysfunction (impaired  relaxation). No apical thrombus seen with Definity contrast.  2. Right ventricular systolic function was not well visualized. The right  ventricular size is not well visualized.  3. The mitral valve is grossly normal. No evidence of mitral valve  regurgitation.  4. The aortic valve was not well visualized. Aortic valve regurgitation  is not visualized. No aortic stenosis is present.  5. The inferior vena cava is normal in size with greater than 50%  respiratory variability, suggesting right atrial pressure of 3 mmHg.  Home Sleep Study 2017:  AHI 7.9/hour    In Lab Sleep Study 2022: AHI 6.9/hr REM AHI 40/hr Supine AHI 15/hr  Assessment & Plan:   No diagnosis found.  Discussion: Michelle Kennedy is a 65 year old woman, never smoker with history of asthma, GERD and OSA returns to pulmonary clinic for asthma follow up.   She is to continue wixella to 500-82mcg 1 puff twice daily. She can continue albuterol as needed.   We will have her be seen by one of our sleep physicians to review her CPAP data and make adjustments as needed.   Encouraged her to work on weight loss and slowly increase her physical activity level.    She is planning to move later this fall. She can follow up as needed before.  Freda Jackson, MD [facility] Office: [phone]   See Amion for Pager Details    Current Outpatient Medications:  .  albuterol (PROVENTIL) (5 MG/ML) 0.5% nebulizer solution, Take 0.5 mLs (2.5 mg total) by nebulization every 6 (six) hours as needed for wheezing or shortness of breath., Disp: 40 mL, Rfl: 3 .  albuterol (VENTOLIN HFA) 108 (90 Base) MCG/ACT inhaler, Inhale 2 puffs into the lungs every 6 (six) hours as needed for wheezing or shortness of breath., Disp: 48 g, Rfl: 1 .  ALPHAGAN P 0.1 % SOLN, Place 1 drop into both eyes in the morning and at bedtime. , Disp: , Rfl:  .  amLODipine (NORVASC) 5 MG tablet, Take 1 tablet (5 mg total) by mouth daily., Disp: 90 tablet, Rfl: 1 .  anastrozole (ARIMIDEX) 1 MG tablet, Take 1 tablet (1 mg total) by mouth daily., Disp: 90 tablet, Rfl: 2 .  atorvastatin (LIPITOR) 40 MG tablet, Take 1 tablet (40 mg total) by mouth daily., Disp: 90 tablet, Rfl: 0 .  CALCIUM PO, Take 1 tablet by mouth every evening., Disp: , Rfl:  .  cholecalciferol (VITAMIN D3) 25 MCG (1000 UNIT) tablet, Take 1,000 Units by mouth every evening. , Disp: , Rfl:  .  clopidogrel (PLAVIX) 75 MG tablet, Take 1 tablet (75 mg total) by mouth daily., Disp: 90 tablet, Rfl: 3 .  Fluticasone-Salmeterol (WIXELA INHUB) 500-50 MCG/DOSE AEPB, Inhale 1 puff into the lungs 2 (two) times daily., Disp: 60 each, Rfl: 5 .  furosemide (LASIX) 20 MG tablet, TAKE 1 TABLET(20 MG) BY MOUTH DAILY AS NEEDED FOR SWELLING, Disp: 30 tablet, Rfl: 3 .  GEMTESA 75 MG TABS, Take 1 tablet by mouth daily., Disp: , Rfl:  .  levothyroxine (SYNTHROID) 200 MCG tablet, TAKE 1 TABLET BY MOUTH  DAILY BEFORE BREAKFAST, Disp: 90 tablet, Rfl: 1 .  lisinopril (ZESTRIL) 20 MG tablet, Take 2 tablets (40 mg total) by mouth daily., Disp: 135 tablet, Rfl: 1 .  meloxicam (MOBIC) 7.5 MG tablet, Take 7.5 mg by mouth daily., Disp:  , Rfl:  .  methocarbamol (ROBAXIN) 500 MG tablet, Take 1 tablet (500 mg total) by mouth every 8 (eight) hours as needed for muscle spasms., Disp: 60 tablet, Rfl: 1 .  Multiple Vitamin (MULTIVITAMIN ADULT) TABS, Take 1 tablet by mouth daily., Disp: , Rfl:  .  Multiple Vitamins-Minerals (OCUVITE PO), Take 1 tablet by mouth daily., Disp: , Rfl:  .  MYRBETRIQ 50 MG TB24 tablet, TAKE 1 TABLET BY MOUTH  DAILY, Disp: 90 tablet, Rfl: 1 .  Omega-3 Fatty Acids (FISH OIL) 1000 MG CAPS, Take 1 capsule by mouth every evening. , Disp: , Rfl:  .  pantoprazole (PROTONIX) 40 MG tablet, Take 1 tablet (40 mg total) by mouth daily., Disp: 90 tablet, Rfl: 1 .  predniSONE (DELTASONE) 10  MG tablet, 4 tabs by mouth once daily for 2 days, then 3 tabs daily x 2 days, then 2 tabs daily x 2 days, then 1 tab daily x 2 days, Disp: 20 tablet, Rfl: 0 .  traMADol (ULTRAM) 50 MG tablet, Take 1 tablet (50 mg total) by mouth every 6 (six) hours as needed., Disp: 30 tablet, Rfl: 0

## 2020-07-30 NOTE — Telephone Encounter (Signed)
Yes, ok to fill albuterol please.

## 2020-07-30 NOTE — Telephone Encounter (Signed)
Caller : Michelle Kennedy  Call Back @ [phone], OPT 2, Reference Number # [id number]  [facility] would like clarification on medication, Patient has an allergy to  [employer]  albuterol (VENTOLIN HFA) 108 (90 Base) MCG/ACT inhaler [[id number]]    Please advise if ok to fill medication.

## 2020-07-30 NOTE — Telephone Encounter (Signed)
Please advise if ok for pharmacy to fill rx or if medication will be change

## 2020-07-30 NOTE — Patient Instructions (Addendum)
Continue wixella 500-70mcg 1 puff twice daily  Continue as needed albuterol  We will refer you to one of our sleep medicine specialists

## 2020-07-31 ENCOUNTER — Encounter: Payer: Self-pay | Admitting: Family

## 2020-07-31 DIAGNOSIS — D229 Melanocytic nevi, unspecified: Secondary | ICD-10-CM

## 2020-08-02 NOTE — Telephone Encounter (Signed)
Pharmacist advised ok to fill rx 

## 2020-08-06 ENCOUNTER — Encounter: Payer: Self-pay | Admitting: Nurse Practitioner

## 2020-08-06 ENCOUNTER — Ambulatory Visit (INDEPENDENT_AMBULATORY_CARE_PROVIDER_SITE_OTHER): Payer: 59 | Admitting: Nurse Practitioner

## 2020-08-06 VITALS — BP 124/64 | HR 88 | Ht 67.75 in | Wt 292.0 lb

## 2020-08-06 DIAGNOSIS — Z1211 Encounter for screening for malignant neoplasm of colon: Secondary | ICD-10-CM | POA: Diagnosis not present

## 2020-08-06 NOTE — Patient Instructions (Signed)
It was a pleasure to see you today. Based on our discussion, I am providing you with my recommendations below:  RECOMMENDATION(S):   Your provider has ordered Cologuard testing as an option for colon cancer screening. This is performed by [employer] [facility] and may be out of network with your insurance. PRIOR to completing the test, it is YOUR responsibility to contact your insurance about covered benefits for this test. Your out of pocket expense could be anywhere from $0.00 to $649.00.   When you call to check coverage with your insurer, please provide the following information:   -The ONLY provider of Cologuard is [place] code for Cologuard is [phone].  [employer] NPI # [id number]  -[employer] Tax ID # [id number]   We have already sent your demographic and insurance information to [facility] (phone number [phone]) and they should contact you within the next week regarding your test. If you have not heard from them within the next week, please call our office at [phone].   BMI:  . If you are age 66 or older, your body mass index should be between 23-30. Your Body mass index is 44.73 kg/m. If this is out of the aforementioned range listed, please consider follow up with your Primary Care Provider.  . If you are age 26 or younger, your body mass index should be between 19-25. Your Body mass index is 44.73 kg/m. If this is out of the aformentioned range listed, please consider follow up with your Primary Care Provider.   Thank you for trusting me with your gastrointestinal care!    Tye Savoy, NP

## 2020-08-06 NOTE — Progress Notes (Signed)
ASSESSMENT AND PLAN    # 65 year old female returning to discuss colon cancer screening.  This was not arranged at time of her appointment last month as patient was significantly short of breath.  She has since established care with pulmonologist for management of asthma.  After an increase in [place] she has not had any further shortness of breath, not even while walking around --We discussed options for colon cancer screening.  From a respiratory standpoint she would probably be fine now to undergo a colonoscopy. However, patient opted for a Cologuard study.  She has no blood in stool or other alarm features.  No family history of colon cancer.  She does understand that a positive Cologuard would warrant a colonoscopy.  # Microcytosis without anemia.  No evidence of iron deficiency on recent iron studies  HISTORY OF PRESENT ILLNESS     Primary Gastroenterologist :Michelle Lund, MD  Chief Complaint : Colon cancer screening  Michelle Kennedy is a 65 y.o. female with past medical history of OSA on CPAP, asthma, thyroid cancer s/p thyroidectomy, breast cancer s/p lumpectomy and chemoradiation 5 years ago on Arimidex, CVA 2021 on Plavix, cholecytectomy, appendectomy  [name] was seen here at the beginning of February for colon cancer screening.  Her last colonoscopy was 10 years ago (hyperplastic polyp).  Colonoscopy was not scheduled at the time because patient was short of breath on the day that I saw her. She has a history of asthma and was to establish care with Pulmonology the following week.  Patient had no bowel changes, blood in stool or other alarm features.    Interval History:  Michelle Kennedy is back to discuss colon cancer screening.  PCP started her on prednisone due to contact with mold . She saw pulmonologist, high-resolution CT scan done and did not indicate tracheobronchomalacia or parenchymal issues. Wixella was increased.  Patient cannot believe how much better she feels.  She is able  to walk around without shortness of breath now.   Past Medical History:  Diagnosis Date  . Abnormally small mouth   . Achilles tendon disorder, right   . Anemia   . Anxiety   . Arthritis    hips and knees  . Asthma    daily inhaler, prn inhaler and neb.  . Asthma due to environmental allergies   . Breast cancer ([place]) 01/2014   left  . Chest pain   . COPD (chronic obstructive pulmonary disease) ([place])   . Dental bridge present    upper front and lower right  . Dental crowns present    x 3  . Depression   . Esophageal spasm    reports since her chemo and breast surgery  she developed esophageal spasms and reports this is in the past has casue her 02 to desat in the  70s , denies sycnope in relation to this , does report hx of vertigo as well   . Family history of anesthesia complication    twin brother aspirated and died on OR table, per pt.  . Fibromyalgia   . Gallbladder disease   . Gallstones   . GERD (gastroesophageal reflux disease)   . Glaucoma   . H/O blood clots    had blood to  PICC line   . History of gastric ulcer    as a teenager  . History of seizure age 27   as a reaction to Penicillin - no seizures since  . History of stomach ulcers   .  History of thyroid cancer    s/p thyroidectomy  . HTN (hypertension)   . Hyperlipidemia   . Hypothyroidism   . Joint pain   . Left knee injury   . Liver disorder    seen when she had her hysterectomy , reports she was told it was  " small lesion" ; but asymptomatic   . Lymphedema    left arm  . Migraines   . Multiple food allergies   . Obesity   . OSA (obstructive sleep apnea) 02/16/2016   CPAP  . Palpitations    reports no longer experiences  . Personal history of chemotherapy 2015  . Personal history of radiation therapy 2015   Left  . Prediabetes   . Rheumatoid arthritis ([place])   . SOB (shortness of breath)   . Swelling of both ankles   . Tinnitus   . UTI (lower urinary tract infection) 02/17/2014  .  Vertigo    last episode  was 2 weeks ago   . Vitamin D deficiency   . Wears contact lenses    left eye only   . Wears hearing aid in both ears     Current Medications, Allergies, Past Surgical History, Family History and Social History were reviewed in [employer] record.   Current Outpatient Medications  Medication Sig Dispense Refill  . albuterol (PROVENTIL) (5 MG/ML) 0.5% nebulizer solution Take 0.5 mLs (2.5 mg total) by nebulization every 6 (six) hours as needed for wheezing or shortness of breath. 40 mL 3  . albuterol (VENTOLIN HFA) 108 (90 Base) MCG/ACT inhaler USE 2 INHALATIONS ORALLY   EVERY 6 HOURS AS NEEDED FORWHEEZING OR SHORTNESS OF   BREATH. 3 each 1  . ALPHAGAN P 0.1 % SOLN Place 1 drop into both eyes in the morning and at bedtime.     Marland Kitchen amLODipine (NORVASC) 5 MG tablet Take 1 tablet (5 mg total) by mouth daily. 90 tablet 1  . anastrozole (ARIMIDEX) 1 MG tablet Take 1 tablet (1 mg total) by mouth daily. 90 tablet 2  . atorvastatin (LIPITOR) 40 MG tablet Take 1 tablet (40 mg total) by mouth daily. 90 tablet 0  . CALCIUM PO Take 1 tablet by mouth every evening.    . cholecalciferol (VITAMIN D3) 25 MCG (1000 UNIT) tablet Take 1,000 Units by mouth every evening.     . clopidogrel (PLAVIX) 75 MG tablet Take 1 tablet (75 mg total) by mouth daily. 90 tablet 3  . Fluticasone-Salmeterol (WIXELA INHUB) 500-50 MCG/DOSE AEPB Inhale 1 puff into the lungs 2 (two) times daily. 60 each 5  . furosemide (LASIX) 20 MG tablet TAKE 1 TABLET(20 MG) BY MOUTH DAILY AS NEEDED FOR SWELLING 30 tablet 3  . GEMTESA 75 MG TABS Take 1 tablet by mouth daily.    Marland Kitchen levothyroxine (SYNTHROID) 200 MCG tablet TAKE 1 TABLET BY MOUTH  DAILY BEFORE BREAKFAST 90 tablet 1  . lisinopril (ZESTRIL) 20 MG tablet Take 2 tablets (40 mg total) by mouth daily. 135 tablet 1  . meloxicam (MOBIC) 7.5 MG tablet Take 7.5 mg by mouth daily.    . methocarbamol (ROBAXIN) 500 MG tablet Take 1 tablet (500 mg  total) by mouth every 8 (eight) hours as needed for muscle spasms. 60 tablet 1  . Multiple Vitamin (MULTIVITAMIN ADULT) TABS Take 1 tablet by mouth daily.    . Multiple Vitamins-Minerals (OCUVITE PO) Take 1 tablet by mouth daily.    Marland Kitchen MYRBETRIQ 50 MG TB24 tablet TAKE 1  TABLET BY MOUTH  DAILY 90 tablet 1  . Omega-3 Fatty Acids (FISH OIL) 1000 MG CAPS Take 1 capsule by mouth every evening.     . pantoprazole (PROTONIX) 40 MG tablet Take 1 tablet (40 mg total) by mouth daily. 90 tablet 1  . predniSONE (DELTASONE) 10 MG tablet 4 tabs by mouth once daily for 2 days, then 3 tabs daily x 2 days, then 2 tabs daily x 2 days, then 1 tab daily x 2 days 20 tablet 0  . traMADol (ULTRAM) 50 MG tablet Take 1 tablet (50 mg total) by mouth every 6 (six) hours as needed. 30 tablet 0   No current facility-administered medications for this visit.    Review of Systems: No chest pain. No shortness of breath. No urinary complaints.   PHYSICAL EXAM :    Wt Readings from Last 3 Encounters:  07/30/20 293 lb 3.2 oz (133 kg)  07/20/20 290 lb (131.5 kg)  06/30/20 296 lb (134.3 kg)    BP 124/64   Pulse 88   Ht 5' 7.75" (1.721 m)   Wt 292 lb (132.5 kg)   BMI 44.73 kg/m  Constitutional:  Pleasant female in no acute distress. Psychiatric: Normal mood and affect. Behavior is normal. EENT: Pupils normal.  Conjunctivae are normal. No scleral icterus. Neck supple.  Cardiovascular: Normal rate, regular rhythm. No edema Pulmonary/chest: Effort normal and breath sounds normal. No wheezing, rales or rhonchi. Abdominal: Soft, nondistended, nontender. Bowel sounds active throughout. There are no masses palpable.  Neurological: Alert and oriented to person place and time. Skin: Skin is warm and dry. No rashes noted.  Tye Savoy, NP  08/06/2020, 1:35 PM  Cc:   Debbrah Alar, NP

## 2020-08-09 ENCOUNTER — Encounter: Payer: Self-pay | Admitting: Family Medicine

## 2020-08-09 MED ORDER — TRAMADOL HCL 50 MG PO TABS: 50.0000 mg | ORAL_TABLET | Freq: Four times a day (QID) | ORAL | 0 refills | Status: DC | PRN

## 2020-08-09 NOTE — Progress Notes (Signed)
____________________________________________________________  Attending physician addendum:  Thank you for sending this case to me. I have reviewed the entire note and agree with the plan.   Henry Danis, MD  ____________________________________________________________  

## 2020-08-16 ENCOUNTER — Encounter (HOSPITAL_BASED_OUTPATIENT_CLINIC_OR_DEPARTMENT_OTHER): Payer: 59 | Admitting: Pulmonary Disease

## 2020-08-19 ENCOUNTER — Ambulatory Visit (INDEPENDENT_AMBULATORY_CARE_PROVIDER_SITE_OTHER): Payer: 59 | Admitting: Pulmonary Disease

## 2020-08-19 ENCOUNTER — Encounter: Payer: Self-pay | Admitting: Pulmonary Disease

## 2020-08-19 ENCOUNTER — Encounter: Payer: Self-pay | Admitting: Family

## 2020-08-19 ENCOUNTER — Other Ambulatory Visit: Payer: Self-pay

## 2020-08-19 VITALS — BP 120/78 | HR 90 | Temp 97.8°F | Ht 67.0 in | Wt 292.0 lb

## 2020-08-19 DIAGNOSIS — Z9989 Dependence on other enabling machines and devices: Secondary | ICD-10-CM | POA: Diagnosis not present

## 2020-08-19 DIAGNOSIS — G4733 Obstructive sleep apnea (adult) (pediatric): Secondary | ICD-10-CM

## 2020-08-19 NOTE — Patient Instructions (Signed)
Obstructive sleep apnea  Recent sleep study shows more events during REM sleep  Auto titrating machine that you are on currently adjust well for different pressure requirements  We are still waiting on a download from the machine  Continue using CPAP  Call with significant concerns  Follow-up with Dr. Erin Fulling  I will be glad to see you again if you are having any problems

## 2020-08-19 NOTE — Progress Notes (Signed)
Analise Glotfelty    [id number]    1955-10-01  Primary Care Physician:O'Sullivan, Lenna Sciara, NP  Referring Physician: Debbrah Alar, NP [address] [place],  [place] [address]  Chief complaint:   Patient being seen for obstructive sleep apnea  HPI:  Diagnosed with obstructive sleep apnea about 2019 Recently had a repeat in lab titration study showing mild obstructive sleep apnea with severe number of events during REM sleep  Came in for evaluation today She does have a history of asthma with improvement in symptoms since medication change recently  She does sleep well at night Feels well during the day Usually goes to bed about 930, sleep onset about 30 minutes They wake up once Final wake up time about 7:30 AM  Usually does not feel sleepy in the mornings Will start feeling sleepy about 3 to 4:00 in the afternoon  She is compliant with CPAP use   Outpatient Encounter Medications as of 08/19/2020  Medication Sig  . albuterol (PROVENTIL) (5 MG/ML) 0.5% nebulizer solution Take 0.5 mLs (2.5 mg total) by nebulization every 6 (six) hours as needed for wheezing or shortness of breath.  Marland Kitchen albuterol (VENTOLIN HFA) 108 (90 Base) MCG/ACT inhaler USE 2 INHALATIONS ORALLY   EVERY 6 HOURS AS NEEDED FORWHEEZING OR SHORTNESS OF   BREATH.  Marland Kitchen ALPHAGAN P 0.1 % SOLN Place 1 drop into both eyes in the morning and at bedtime.   Marland Kitchen amLODipine (NORVASC) 5 MG tablet Take 1 tablet (5 mg total) by mouth daily.  Marland Kitchen anastrozole (ARIMIDEX) 1 MG tablet Take 1 tablet (1 mg total) by mouth daily.  Marland Kitchen atorvastatin (LIPITOR) 40 MG tablet Take 1 tablet (40 mg total) by mouth daily.  Marland Kitchen CALCIUM PO Take 1 tablet by mouth every evening.  . cholecalciferol (VITAMIN D3) 25 MCG (1000 UNIT) tablet Take 1,000 Units by mouth every evening.   . clopidogrel (PLAVIX) 75 MG tablet Take 1 tablet (75 mg total) by mouth daily.  . Fluticasone-Salmeterol (WIXELA INHUB) 500-50 MCG/DOSE AEPB Inhale 1  puff into the lungs 2 (two) times daily.  . furosemide (LASIX) 20 MG tablet TAKE 1 TABLET(20 MG) BY MOUTH DAILY AS NEEDED FOR SWELLING  . GEMTESA 75 MG TABS Take 1 tablet by mouth daily.  Marland Kitchen levothyroxine (SYNTHROID) 200 MCG tablet TAKE 1 TABLET BY MOUTH  DAILY BEFORE BREAKFAST  . lisinopril (ZESTRIL) 20 MG tablet Take 2 tablets (40 mg total) by mouth daily.  . meloxicam (MOBIC) 7.5 MG tablet Take 7.5 mg by mouth daily.  . methocarbamol (ROBAXIN) 500 MG tablet Take 1 tablet (500 mg total) by mouth every 8 (eight) hours as needed for muscle spasms.  . Multiple Vitamin (MULTIVITAMIN ADULT) TABS Take 1 tablet by mouth daily.  . Multiple Vitamins-Minerals (OCUVITE PO) Take 1 tablet by mouth daily.  Marland Kitchen MYRBETRIQ 50 MG TB24 tablet TAKE 1 TABLET BY MOUTH  DAILY  . Omega-3 Fatty Acids (FISH OIL) 1000 MG CAPS Take 1 capsule by mouth every evening.   . pantoprazole (PROTONIX) 40 MG tablet Take 1 tablet (40 mg total) by mouth daily.  . traMADol (ULTRAM) 50 MG tablet Take 1 tablet (50 mg total) by mouth every 6 (six) hours as needed.   No facility-administered encounter medications on file as of 08/19/2020.    Allergies as of 08/19/2020 - Review Complete 08/19/2020  Allergen Reaction Noted  . Bee venom Anaphylaxis 02/17/2014  . Codeine Shortness Of Breath and Rash 12/01/2009  .  Contrast media [iodinated diagnostic agents] Shortness Of Breath 12/31/2013  . Iodine Other (See Comments) 12/01/2009  . Latex Anaphylaxis and Rash 12/20/2009  . Lidocaine Shortness Of Breath and Swelling 12/20/2009  . Penicillins Shortness Of Breath, Rash, and Other (See Comments) 12/01/2009  . Pentazocine lactate Shortness Of Breath and Rash 12/20/2009  . Shellfish allergy Shortness Of Breath and Rash 02/17/2014  . Aspirin Rash and Other (See Comments) 12/20/2009  . Erythromycin Swelling and Rash 12/01/2009  . Symbicort [budesonide-formoterol fumarate] Other (See Comments) 08/29/2013  . Betadine [povidone iodine]  12/03/2017   . Compazine [prochlorperazine maleate] Rash 04/02/2014  . Sulfonamide derivatives Rash 12/20/2009    Past Medical History:  Diagnosis Date  . Abnormally small mouth   . Achilles tendon disorder, right   . Anemia   . Anxiety   . Arthritis    hips and knees  . Asthma    daily inhaler, prn inhaler and neb.  . Asthma due to environmental allergies   . Breast cancer ([place]) 01/2014   left  . Chest pain   . COPD (chronic obstructive pulmonary disease) ([place])   . Dental bridge present    upper front and lower right  . Dental crowns present    x 3  . Depression   . Esophageal spasm    reports since her chemo and breast surgery  she developed esophageal spasms and reports this is in the past has casue her 02 to desat in the  70s , denies sycnope in relation to this , does report hx of vertigo as well   . Family history of anesthesia complication    twin brother aspirated and died on OR table, per pt.  . Fibromyalgia   . Gallbladder disease   . Gallstones   . GERD (gastroesophageal reflux disease)   . Glaucoma   . H/O blood clots    had blood to  PICC line   . History of gastric ulcer    as a teenager  . History of seizure age 32   as a reaction to Penicillin - no seizures since  . History of stomach ulcers   . History of thyroid cancer    s/p thyroidectomy  . HTN (hypertension)   . Hyperlipidemia   . Hypothyroidism   . Joint pain   . Left knee injury   . Liver disorder    seen when she had her hysterectomy , reports she was told it was  " small lesion" ; but asymptomatic   . Lymphedema    left arm  . Migraines   . Multiple food allergies   . Obesity   . OSA (obstructive sleep apnea) 02/16/2016   CPAP  . Palpitations    reports no longer experiences  . Personal history of chemotherapy 2015  . Personal history of radiation therapy 2015   Left  . Prediabetes   . Rheumatoid arthritis ([place])   . SOB (shortness of breath)   . Swelling of both ankles   . Tinnitus   .  UTI (lower urinary tract infection) 02/17/2014  . Vertigo    last episode  was 2 weeks ago   . Vitamin D deficiency   . Wears contact lenses    left eye only   . Wears hearing aid in both ears     Past Surgical History:  Procedure Laterality Date  . ABDOMINAL HYSTERECTOMY  2004   complete  . ACHILLES TENDON REPAIR Right   . APPENDECTOMY  2004  .  BREAST EXCISIONAL BIOPSY    . BREAST LUMPECTOMY Left    2015  . BREAST LUMPECTOMY WITH NEEDLE LOCALIZATION AND AXILLARY SENTINEL LYMPH NODE BX Left 02/23/2014   Procedure: BREAST LUMPECTOMY WITH NEEDLE LOCALIZATION AND AXILLARY SENTINEL LYMPH NODE BIOPSY;  Surgeon: Excell Seltzer, MD;  Location: [facility];  Service: General;  Laterality: Left;  . BREAST SURGERY  2011   left breast biposy  . CHOLECYSTECTOMY  1990  . COLONOSCOPY W/ POLYPECTOMY  06/2009  . EYE SURGERY Right 2013   exc. warts from underneath eyelid  . INCONTINENCE SURGERY  2004  . KNEE ARTHROSCOPY Bilateral    x 6 each knee  . LIGAMENT REPAIR Right    thumb/wrist  . ORIF TOE FRACTURE Right    great toe  . PORTACATH PLACEMENT N/A 03/24/2014   Procedure: INSERTION PORT-A-CATH;  Surgeon: Excell Seltzer, MD;  Location: [facility];  Service: General;  Laterality: N/A;; removed   . THYROIDECTOMY  2000  . TONSILLECTOMY AND ADENOIDECTOMY  2000  . TOTAL KNEE ARTHROPLASTY Left 12/03/2017   Procedure: LEFT TOTAL KNEE ARTHROPLASTY;  Surgeon: Gaynelle Arabian, MD;  Location: [facility];  Service: Orthopedics;  Laterality: Left;  . TUMOR EXCISION     from thoracic spine    Family History  Problem Relation Age of Onset  . Alcohol abuse Mother   . Arthritis Mother   . Hypertension Mother   . Bipolar disorder Mother   . Breast cancer Mother 38       unconfirmed  . Lung cancer Mother 58       smoker  . Hyperlipidemia Mother   . Stroke Mother   . Cancer Mother   . Depression Mother   . Anxiety disorder Mother   . Alcoholism Mother   . Drug abuse  Mother   . Eating disorder Mother   . Obesity Mother   . Alcohol abuse Father   . Hyperlipidemia Father   . Kidney disease Father   . Diabetes Father   . Hypertension Father   . Cystic kidney disease Father   . Thyroid disease Father   . Liver disease Father   . Alcoholism Father   . Lung cancer Father   . Arthritis Maternal Grandmother   . Diabetes Paternal Grandmother   . Thyroid cancer Sister 3       type?; currently 83  . Other Sister        ovarian tumor @ 22; TAH/BSO  . Breast cancer Sister   . Thyroid cancer Brother        dx 21s; currently 4  . Breast cancer Maternal Aunt        dx 33s; deceased 46  . Thyroid cancer Paternal Aunt        All 3 paternal aunts with thyroid ca in 30s/40s  . Lung cancer Paternal Aunt        2 of 3 paternal aunts with lung cancer    Social History   Socioeconomic History  . Marital status: Married    Spouse name: Not on file  . Number of children: 2  . Years of education: Not on file  . Highest education level: Not on file  Occupational History  . 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 Smoking status: Never Smoker  . Smokeless tobacco: Never Used  Vaping Use  . Vaping Use: Never used  Substance and Sexual Activity  . Alcohol use: No  Alcohol/week: 0.0 standard drinks  . Drug use: No  . Sexual activity: Not Currently    Partners: Male    Comment: menarche age 15, P 2, first birth age 8, menopause age 85, Premarin x 10 yrs  Other Topics Concern  . Not on file  Social History Narrative   Regular exercise: yes   Right handed    Lives with husband one story home.   Social Determinants of Health   Financial Resource Strain: Not on file  Food Insecurity: Not on file  Transportation Needs: Not on file  Physical Activity: Not on file  Stress: Not on file  Social Connections: Not on file  Intimate Partner Violence: Not on file    Review of Systems  Constitutional: Negative for fatigue.   Respiratory: Positive for apnea.   Psychiatric/Behavioral: Positive for sleep disturbance.    Vitals:   08/19/20 1627  BP: 120/78  Pulse: 90  Temp: 97.8 F (36.6 C)  SpO2: 97%     Physical Exam Constitutional:      Appearance: She is obese.  HENT:     Head: Normocephalic.     Nose: No congestion.     Mouth/Throat:     Mouth: Mucous membranes are moist.     Comments: Mallampati 4, crowded oropharynx, macroglossia Eyes:     General:        Right eye: No discharge.        Left eye: No discharge.     Pupils: Pupils are equal, round, and reactive to light.  Cardiovascular:     Rate and Rhythm: Normal rate and regular rhythm.     Heart sounds: No murmur heard. No friction rub.  Pulmonary:     Effort: No respiratory distress.     Breath sounds: No stridor. No wheezing or rhonchi.  Musculoskeletal:     Cervical back: No rigidity or tenderness.  Neurological:     Mental Status: She is alert.  Psychiatric:        Mood and Affect: Mood normal.    Data Reviewed: Recent sleep study reviewed showing mild obstructive sleep apnea  Compliance reveals 99% compliance Average use of 9 hours 41 minutes Set between 5 and 20 95 percentile pressure of 11.5 Residual AHI 1.2  Echocardiogram 08/23/2019 shows normal ejection fraction diastolic dysfunction  Assessment:  Mild obstructive sleep apnea -Adequately treated with CPAP therapy -very good compliance  Obesity  Asthma with improvement in symptoms  Plan/Recommendations: Continue auto CPAP 5-20  No changes need to be made to a CPAP at present  I reassured patient based off of the compliance data this obstructive sleep apnea is adequately treated at present  Encourage weight loss efforts  We will follow up with Dr. Erin Fulling  Will be glad to see as needed  I spent 30 minutes dedicated to the care of this patient on the date of this encounter to include previsit review of records, face-to-face time with the patient  discussing conditions above, post visit ordering of testing, clinical documentation with electronic health record and communicated necessary findings to members of the patient's care team  Sherrilyn Rist MD [facility] 08/19/2020, 5:05 PM  CC: Debbrah Alar, NP

## 2020-08-24 ENCOUNTER — Ambulatory Visit: Payer: 59 | Admitting: Pulmonary Disease

## 2020-08-25 LAB — COLOGUARD: COLOGUARD: NEGATIVE

## 2020-08-26 ENCOUNTER — Other Ambulatory Visit: Payer: Self-pay

## 2020-08-26 MED ORDER — MELOXICAM 7.5 MG PO TABS: 7.5000 mg | ORAL_TABLET | Freq: Every day | ORAL | 0 refills | Status: DC

## 2020-08-27 ENCOUNTER — Other Ambulatory Visit: Payer: Self-pay

## 2020-08-27 LAB — COLOGUARD: Cologuard: NEGATIVE

## 2020-09-08 ENCOUNTER — Other Ambulatory Visit: Payer: Self-pay

## 2020-09-08 ENCOUNTER — Telehealth: Payer: Self-pay | Admitting: Medical

## 2020-09-08 ENCOUNTER — Ambulatory Visit (HOSPITAL_BASED_OUTPATIENT_CLINIC_OR_DEPARTMENT_OTHER)
Admission: RE | Admit: 2020-09-08 | Discharge: 2020-09-08 | Disposition: A | Discharge status: Home / Self Care | Payer: 59 | Source: Ambulatory Visit | Attending: Medical | Admitting: Medical

## 2020-09-08 ENCOUNTER — Ambulatory Visit (INDEPENDENT_AMBULATORY_CARE_PROVIDER_SITE_OTHER): Payer: 59 | Admitting: Medical

## 2020-09-08 ENCOUNTER — Encounter: Payer: Self-pay | Admitting: Medical

## 2020-09-08 VITALS — BP 138/80 | HR 74 | Resp 18 | Ht 67.0 in | Wt 291.0 lb

## 2020-09-08 DIAGNOSIS — M79672 Pain in left foot: Secondary | ICD-10-CM

## 2020-09-08 DIAGNOSIS — M79675 Pain in left toe(s): Secondary | ICD-10-CM

## 2020-09-08 DIAGNOSIS — S92912A Unspecified fracture of left toe(s), initial encounter for closed fracture: Secondary | ICD-10-CM

## 2020-09-08 DIAGNOSIS — I10 Essential (primary) hypertension: Secondary | ICD-10-CM | POA: Diagnosis not present

## 2020-09-08 NOTE — Progress Notes (Signed)
Subjective:    Patient ID: Michelle Kennedy, female    DOB: [birth year], 66 y.o.   MRN: [id number]  HPI  Pt in for left foot pain. She states she stubbed her left foot coming out of shower. This occurred 11 days ago. Pt did this just before trip to [place]. She buddy taped 2nd, 3rd and 4th toe. Bruised at base at onset. Pt has been using shoe that is rigid.   Base of toes and bottom of foot hurt. Area between 2nd and 3rd toe is worse.  Hx of htn. Pt is on medication.   She has not taken any med for pain.  Review of Systems  Constitutional: Negative for fatigue.  Respiratory: Negative for cough, chest tightness, shortness of breath and wheezing.   Cardiovascular: Negative for chest pain and palpitations.  Gastrointestinal: Negative for abdominal pain.  Musculoskeletal: Negative for back pain.       Foot and toe pain.  Hematological: Negative for adenopathy. Does not bruise/bleed easily.  Psychiatric/Behavioral: Negative for behavioral problems and confusion. The patient is not nervous/anxious.     Past Medical History:  Diagnosis Date  . Abnormally small mouth   . Achilles tendon disorder, right   . Anemia   . Anxiety   . Arthritis    hips and knees  . Asthma    daily inhaler, prn inhaler and neb.  . Asthma due to environmental allergies   . Breast cancer ([place]) 01/2014   left  . Chest pain   . COPD (chronic obstructive pulmonary disease) ([place])   . Dental bridge present    upper front and lower right  . Dental crowns present    x 3  . Depression   . Esophageal spasm    reports since her chemo and breast surgery  she developed esophageal spasms and reports this is in the past has casue her 02 to desat in the  70s , denies sycnope in relation to this , does report hx of vertigo as well   . Family history of anesthesia complication    twin brother aspirated and died on OR table, per pt.  . Fibromyalgia   . Gallbladder disease   . Gallstones   . GERD (gastroesophageal  reflux disease)   . Glaucoma   . H/O blood clots    had blood to  PICC line   . History of gastric ulcer    as a teenager  . History of seizure age 29   as a reaction to Penicillin - no seizures since  . History of stomach ulcers   . History of thyroid cancer    s/p thyroidectomy  . HTN (hypertension)   . Hyperlipidemia   . Hypothyroidism   . Joint pain   . Left knee injury   . Liver disorder    seen when she had her hysterectomy , reports she was told it was  " small lesion" ; but asymptomatic   . Lymphedema    left arm  . Migraines   . Multiple food allergies   . Obesity   . OSA (obstructive sleep apnea) 02/16/2016   CPAP  . Palpitations    reports no longer experiences  . Personal history of chemotherapy 2015  . Personal history of radiation therapy 2015   Left  . Prediabetes   . Rheumatoid arthritis ([place])   . SOB (shortness of breath)   . Swelling of both ankles   . Tinnitus   . UTI (lower  urinary tract infection) 02/17/2014  . Vertigo    last episode  was 2 weeks ago   . Vitamin D deficiency   . Wears contact lenses    left eye only   . Wears hearing aid in both ears      Social History   Socioeconomic History  . Marital status: Married    Spouse name: Not on file  . Number of children: 2  . Years of education: Not on file  . Highest education level: Not on file  Occupational History  . 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 Smoking status: Never Smoker  . Smokeless tobacco: Never Used  Vaping Use  . Vaping Use: Never used  Substance and Sexual Activity  . Alcohol use: No    Alcohol/week: 0.0 standard drinks  . Drug use: No  . Sexual activity: Not Currently    Partners: Male    Comment: menarche age 81, P 2, first birth age 84, menopause age 19, Premarin x 10 yrs  Other Topics Concern  . Not on file  Social History Narrative   Regular exercise: yes   Right handed    Lives with husband one story home.   Social  Determinants of Health   Financial Resource Strain: Not on file  Food Insecurity: Not on file  Transportation Needs: Not on file  Physical Activity: Not on file  Stress: Not on file  Social Connections: Not on file  Intimate Partner Violence: Not on file    Past Surgical History:  Procedure Laterality Date  . ABDOMINAL HYSTERECTOMY  2004   complete  . ACHILLES TENDON REPAIR Right   . APPENDECTOMY  2004  . BREAST EXCISIONAL BIOPSY    . BREAST LUMPECTOMY Left    2015  . BREAST LUMPECTOMY WITH NEEDLE LOCALIZATION AND AXILLARY SENTINEL LYMPH NODE BX Left 02/23/2014   Procedure: BREAST LUMPECTOMY WITH NEEDLE LOCALIZATION AND AXILLARY SENTINEL LYMPH NODE BIOPSY;  Surgeon: Excell Seltzer, MD;  Location: [facility];  Service: General;  Laterality: Left;  . BREAST SURGERY  2011   left breast biposy  . CHOLECYSTECTOMY  1990  . COLONOSCOPY W/ POLYPECTOMY  06/2009  . EYE SURGERY Right 2013   exc. warts from underneath eyelid  . INCONTINENCE SURGERY  2004  . KNEE ARTHROSCOPY Bilateral    x 6 each knee  . LIGAMENT REPAIR Right    thumb/wrist  . ORIF TOE FRACTURE Right    great toe  . PORTACATH PLACEMENT N/A 03/24/2014   Procedure: INSERTION PORT-A-CATH;  Surgeon: Excell Seltzer, MD;  Location: [facility];  Service: General;  Laterality: N/A;; removed   . THYROIDECTOMY  2000  . TONSILLECTOMY AND ADENOIDECTOMY  2000  . TOTAL KNEE ARTHROPLASTY Left 12/03/2017   Procedure: LEFT TOTAL KNEE ARTHROPLASTY;  Surgeon: Gaynelle Arabian, MD;  Location: [facility];  Service: Orthopedics;  Laterality: Left;  . TUMOR EXCISION     from thoracic spine    Family History  Problem Relation Age of Onset  . Alcohol abuse Mother   . Arthritis Mother   . Hypertension Mother   . Bipolar disorder Mother   . Breast cancer Mother 6       unconfirmed  . Lung cancer Mother 53       smoker  . Hyperlipidemia Mother   . Stroke Mother   . Cancer Mother   . Depression Mother   .  Anxiety disorder Mother   . Alcoholism  Mother   . Drug abuse Mother   . Eating disorder Mother   . Obesity Mother   . Alcohol abuse Father   . Hyperlipidemia Father   . Kidney disease Father   . Diabetes Father   . Hypertension Father   . Cystic kidney disease Father   . Thyroid disease Father   . Liver disease Father   . Alcoholism Father   . Lung cancer Father   . Arthritis Maternal Grandmother   . Diabetes Paternal Grandmother   . Thyroid cancer Sister 65       type?; currently 6  . Other Sister        ovarian tumor @ 1; TAH/BSO  . Breast cancer Sister   . Thyroid cancer Brother        dx 45s; currently 76  . Breast cancer Maternal Aunt        dx 66s; deceased 59  . Thyroid cancer Paternal Aunt        All 3 paternal aunts with thyroid ca in 30s/40s  . Lung cancer Paternal Aunt        2 of 3 paternal aunts with lung cancer    Allergies  Allergen Reactions  . Bee Venom Anaphylaxis  . Codeine Shortness Of Breath and Rash  . Contrast Media [Iodinated Diagnostic Agents] Shortness Of Breath  . Iodine Other (See Comments)    CARDIAC ARREST  . Latex Anaphylaxis and Rash  . Lidocaine Shortness Of Breath and Swelling    SWELLING OF MOUTH AND THROAT, low BP  . Penicillins Shortness Of Breath, Rash and Other (See Comments)    SEIZURE Did it involve swelling of the face/tongue/throat, SOB, or low BP? no Did it involve sudden or severe rash/hives, skin peeling, or any reaction on the inside of your mouth or nose? yes Did you need to seek medical attention at a hospital or doctor's office? yes When did it last happen?2010 If all above answers are "NO", may proceed with cephalosporin use.   Marland Kitchen Pentazocine Lactate Shortness Of Breath and Rash  . Shellfish Allergy Shortness Of Breath and Rash  . Aspirin Rash and Other (See Comments)    GI UPSET  . Erythromycin Swelling and Rash    SWELLING OF JOINTS  . Symbicort [Budesonide-Formoterol Fumarate] Other (See Comments)     BURNING OF TONGUE AND LIPS  . Betadine [Povidone Iodine]     Rash. Breathing problems.   . Compazine [Prochlorperazine Maleate] Rash    Rash on face,chest, arms, back  . Sulfonamide Derivatives Rash    Current Outpatient Medications on File Prior to Visit  Medication Sig Dispense Refill  . albuterol (PROVENTIL) (5 MG/ML) 0.5% nebulizer solution Take 0.5 mLs (2.5 mg total) by nebulization every 6 (six) hours as needed for wheezing or shortness of breath. 40 mL 3  . albuterol (VENTOLIN HFA) 108 (90 Base) MCG/ACT inhaler USE 2 INHALATIONS ORALLY   EVERY 6 HOURS AS NEEDED FORWHEEZING OR SHORTNESS OF   BREATH. 3 each 1  . ALPHAGAN P 0.1 % SOLN Place 1 drop into both eyes in the morning and at bedtime.     Marland Kitchen amLODipine (NORVASC) 5 MG tablet Take 1 tablet (5 mg total) by mouth daily. 90 tablet 1  . anastrozole (ARIMIDEX) 1 MG tablet Take 1 tablet (1 mg total) by mouth daily. 90 tablet 2  . atorvastatin (LIPITOR) 40 MG tablet Take 1 tablet (40 mg total) by mouth daily. 90 tablet 0  . CALCIUM PO Take  1 tablet by mouth every evening.    . cholecalciferol (VITAMIN D3) 25 MCG (1000 UNIT) tablet Take 1,000 Units by mouth every evening.     . clopidogrel (PLAVIX) 75 MG tablet Take 1 tablet (75 mg total) by mouth daily. 90 tablet 3  . Fluticasone-Salmeterol (WIXELA INHUB) 500-50 MCG/DOSE AEPB Inhale 1 puff into the lungs 2 (two) times daily. 60 each 5  . furosemide (LASIX) 20 MG tablet TAKE 1 TABLET(20 MG) BY MOUTH DAILY AS NEEDED FOR SWELLING 30 tablet 3  . GEMTESA 75 MG TABS Take 1 tablet by mouth daily.    Marland Kitchen levothyroxine (SYNTHROID) 200 MCG tablet TAKE 1 TABLET BY MOUTH  DAILY BEFORE BREAKFAST 90 tablet 1  . lisinopril (ZESTRIL) 20 MG tablet Take 2 tablets (40 mg total) by mouth daily. 135 tablet 1  . meloxicam (MOBIC) 7.5 MG tablet Take 1 tablet (7.5 mg total) by mouth daily. 90 tablet 0  . methocarbamol (ROBAXIN) 500 MG tablet Take 1 tablet (500 mg total) by mouth every 8 (eight) hours as needed for  muscle spasms. 60 tablet 1  . Multiple Vitamin (MULTIVITAMIN ADULT) TABS Take 1 tablet by mouth daily.    . Multiple Vitamins-Minerals (OCUVITE PO) Take 1 tablet by mouth daily.    Marland Kitchen MYRBETRIQ 50 MG TB24 tablet TAKE 1 TABLET BY MOUTH  DAILY 90 tablet 1  . Omega-3 Fatty Acids (FISH OIL) 1000 MG CAPS Take 1 capsule by mouth every evening.     . pantoprazole (PROTONIX) 40 MG tablet Take 1 tablet (40 mg total) by mouth daily. 90 tablet 1  . traMADol (ULTRAM) 50 MG tablet Take 1 tablet (50 mg total) by mouth every 6 (six) hours as needed. 30 tablet 0   No current facility-administered medications on file prior to visit.    BP 138/80   Pulse 74   Resp 18   Ht 5\' 7"  (1.702 m)   Wt 291 lb (132 kg)   SpO2 98%   BMI 45.58 kg/m       Objective:   Physical Exam  General- No acute distress. Pleasant patient. Neck- Full range of motion, no jvd Lungs- Clear, even and unlabored. Heart- regular rate and rhythm. Neurologic- CNII- XII grossly intact.  Left foot-distal 2/3 bottom aspect tender near metatarsal head region. Also pain on palpation base of 2nd and 3rd toe.         Assessment & Plan:  Hx of left foot and toe pain. On palpation moderate to severe. Will get xray of foot and toes. If fracture then refer to sports med or podiatrist. Even if no fracture seen on xray consider referral due to suspicious level of pain. Try tylenol for pain. May need to use nsaid or rx pain med after xray review.  Bp borderline high. Maybe related to pain.  contiue current bp med regmen.  Could use post of boot that you have at home pending xray result and potential referral.  Follow up date to be determined after xray review.

## 2020-09-08 NOTE — Telephone Encounter (Signed)
Referred to sports med

## 2020-09-08 NOTE — Patient Instructions (Addendum)
Hx of left foot and toe pain. On palpation moderate to severe. Will get xray of left foot and toes. If fracture then refer to sports med or podiatrist. Even if no fracture seen on xray consider referral due to suspicious level of pain. Try tylenol for pain. May need to use nsaid or rx pain med after xray review.  Bp borderline high. Maybe related to pain.  contiue current bp med regmen.  Could use post of boot that you have at home pending xray result and potential referral.  Follow up date to be determined after xray review.

## 2020-09-09 ENCOUNTER — Encounter: Payer: Self-pay | Admitting: Family

## 2020-09-10 ENCOUNTER — Other Ambulatory Visit: Payer: Self-pay

## 2020-09-10 MED ORDER — LISINOPRIL 20 MG PO TABS: 35.0000 mg | ORAL_TABLET | Freq: Every day | ORAL | 1 refills | Status: DC

## 2020-09-13 ENCOUNTER — Other Ambulatory Visit: Payer: Self-pay | Admitting: Hematology and Oncology

## 2020-09-13 DIAGNOSIS — Z1231 Encounter for screening mammogram for malignant neoplasm of breast: Secondary | ICD-10-CM

## 2020-09-14 ENCOUNTER — Other Ambulatory Visit: Payer: Self-pay | Admitting: *Deleted

## 2020-09-14 ENCOUNTER — Telehealth: Payer: Self-pay | Admitting: *Deleted

## 2020-09-14 MED ORDER — LEVOTHYROXINE SODIUM 200 MCG PO TABS: 200.0000 ug | ORAL_TABLET | Freq: Every day | ORAL | 1 refills | Status: DC

## 2020-09-14 NOTE — Telephone Encounter (Signed)
Caller Name [place] Phone Number [phone] Patient Name Michelle Kennedy Patient DOB 13-Dec-1955 Call Type Message Only Information Provided Reason for Call Medication Question / Request Initial Comment Caller is wanting to leave a message for PCP Additional Comment Caller states Pharmacy is out of Meloxicam and is requesting an alternative. Pharmacy fax number [phone]. Provided caller with office hours and advised to call back during business hours. Caller declined triage.

## 2020-09-14 NOTE — Telephone Encounter (Signed)
There is no aspirin in aleve, but it is an NSAID like meloxicam which she can tolerate. Safest option would be tylenol.

## 2020-09-14 NOTE — Telephone Encounter (Signed)
Spoke with husband and he stated that [facility] is unable to get Meloxicam.

## 2020-09-14 NOTE — Telephone Encounter (Signed)
I would recommend that the patient use aleve sparingly instead of meloxicam.  Tylenol should be first line.

## 2020-09-14 NOTE — Telephone Encounter (Signed)
Pt says she cant take Aleve because she is allergic to Asprin.

## 2020-09-15 NOTE — Telephone Encounter (Signed)
Pt is aware of instructions.

## 2020-09-15 NOTE — Telephone Encounter (Signed)
Lvm for patient to call back about this message 

## 2020-09-17 ENCOUNTER — Other Ambulatory Visit: Payer: Self-pay

## 2020-09-17 ENCOUNTER — Ambulatory Visit (INDEPENDENT_AMBULATORY_CARE_PROVIDER_SITE_OTHER): Payer: 59 | Admitting: Family Medicine

## 2020-09-17 VITALS — BP 144/86 | Ht 67.0 in | Wt 290.0 lb

## 2020-09-17 DIAGNOSIS — S92525A Nondisplaced fracture of medial phalanx of left lesser toe(s), initial encounter for closed fracture: Secondary | ICD-10-CM | POA: Diagnosis not present

## 2020-09-17 NOTE — Progress Notes (Signed)
Michelle Kennedy - 65 y.o. female MRN [id number]  Date of birth: 1955-10-11  SUBJECTIVE:  Including CC & ROS.  No chief complaint on file.   Michelle Kennedy is a 65 y.o. female that is presenting presenting with left toe pain.  She stubbed her toe while she was getting into the shower.  She had an x-ray that showed a fracture.  Still having some pain after she has been buddy taping it.  Independent review of the left foot x-ray from 4/20 shows a nondisplaced transverse fracture of the middle phalanx of the third toe.   Review of Systems See HPI   HISTORY: Past Medical, Surgical, Social, and Family History Reviewed & Updated per EMR.   Pertinent Historical Findings include:  Past Medical History:  Diagnosis Date  . Abnormally small mouth   . Achilles tendon disorder, right   . Anemia   . Anxiety   . Arthritis    hips and knees  . Asthma    daily inhaler, prn inhaler and neb.  . Asthma due to environmental allergies   . Breast cancer ([place]) 01/2014   left  . Chest pain   . COPD (chronic obstructive pulmonary disease) ([place])   . Dental bridge present    upper front and lower right  . Dental crowns present    x 3  . Depression   . Esophageal spasm    reports since her chemo and breast surgery  she developed esophageal spasms and reports this is in the past has casue her 02 to desat in the  70s , denies sycnope in relation to this , does report hx of vertigo as well   . Family history of anesthesia complication    twin brother aspirated and died on OR table, per pt.  . Fibromyalgia   . Gallbladder disease   . Gallstones   . GERD (gastroesophageal reflux disease)   . Glaucoma   . H/O blood clots    had blood to  PICC line   . History of gastric ulcer    as a teenager  . History of seizure age 71   as a reaction to Penicillin - no seizures since  . History of stomach ulcers   . History of thyroid cancer    s/p thyroidectomy  . HTN (hypertension)   . Hyperlipidemia    . Hypothyroidism   . Joint pain   . Left knee injury   . Liver disorder    seen when she had her hysterectomy , reports she was told it was  " small lesion" ; but asymptomatic   . Lymphedema    left arm  . Migraines   . Multiple food allergies   . Obesity   . OSA (obstructive sleep apnea) 02/16/2016   CPAP  . Palpitations    reports no longer experiences  . Personal history of chemotherapy 2015  . Personal history of radiation therapy 2015   Left  . Prediabetes   . Rheumatoid arthritis ([place])   . SOB (shortness of breath)   . Swelling of both ankles   . Tinnitus   . UTI (lower urinary tract infection) 02/17/2014  . Vertigo    last episode  was 2 weeks ago   . Vitamin D deficiency   . Wears contact lenses    left eye only   . Wears hearing aid in both ears     Past Surgical History:  Procedure Laterality Date  . ABDOMINAL HYSTERECTOMY  2004   complete  . ACHILLES TENDON REPAIR Right   . APPENDECTOMY  2004  . BREAST EXCISIONAL BIOPSY    . BREAST LUMPECTOMY Left    2015  . BREAST LUMPECTOMY WITH NEEDLE LOCALIZATION AND AXILLARY SENTINEL LYMPH NODE BX Left 02/23/2014   Procedure: BREAST LUMPECTOMY WITH NEEDLE LOCALIZATION AND AXILLARY SENTINEL LYMPH NODE BIOPSY;  Surgeon: Excell Seltzer, MD;  Location: [facility];  Service: General;  Laterality: Left;  . BREAST SURGERY  2011   left breast biposy  . CHOLECYSTECTOMY  1990  . COLONOSCOPY W/ POLYPECTOMY  06/2009  . EYE SURGERY Right 2013   exc. warts from underneath eyelid  . INCONTINENCE SURGERY  2004  . KNEE ARTHROSCOPY Bilateral    x 6 each knee  . LIGAMENT REPAIR Right    thumb/wrist  . ORIF TOE FRACTURE Right    great toe  . PORTACATH PLACEMENT N/A 03/24/2014   Procedure: INSERTION PORT-A-CATH;  Surgeon: Excell Seltzer, MD;  Location: [facility];  Service: General;  Laterality: N/A;; removed   . THYROIDECTOMY  2000  . TONSILLECTOMY AND ADENOIDECTOMY  2000  . TOTAL KNEE  ARTHROPLASTY Left 12/03/2017   Procedure: LEFT TOTAL KNEE ARTHROPLASTY;  Surgeon: Gaynelle Arabian, MD;  Location: [facility];  Service: Orthopedics;  Laterality: Left;  . TUMOR EXCISION     from thoracic spine    Family History  Problem Relation Age of Onset  . Alcohol abuse Mother   . Arthritis Mother   . Hypertension Mother   . Bipolar disorder Mother   . Breast cancer Mother 59       unconfirmed  . Lung cancer Mother 42       smoker  . Hyperlipidemia Mother   . Stroke Mother   . Cancer Mother   . Depression Mother   . Anxiety disorder Mother   . Alcoholism Mother   . Drug abuse Mother   . Eating disorder Mother   . Obesity Mother   . Alcohol abuse Father   . Hyperlipidemia Father   . Kidney disease Father   . Diabetes Father   . Hypertension Father   . Cystic kidney disease Father   . Thyroid disease Father   . Liver disease Father   . Alcoholism Father   . Lung cancer Father   . Arthritis Maternal Grandmother   . Diabetes Paternal Grandmother   . Thyroid cancer Sister 61       type?; currently 93  . Other Sister        ovarian tumor @ 16; TAH/BSO  . Breast cancer Sister   . Thyroid cancer Brother        dx 58s; currently 18  . Breast cancer Maternal Aunt        dx 33s; deceased 46  . Thyroid cancer Paternal Aunt        All 3 paternal aunts with thyroid ca in 30s/40s  . Lung cancer Paternal Aunt        2 of 3 paternal aunts with lung cancer    Social History   Socioeconomic History  . Marital status: Married    Spouse name: Not on file  . Number of children: 2  . Years of education: Not on file  . Highest education level: Not on file  Occupational History  . 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 Smoking status: Never Smoker  . Smokeless tobacco: Never Used  Vaping Use  .  Vaping Use: Never used  Substance and Sexual Activity  . Alcohol use: No    Alcohol/week: 0.0 standard drinks  . Drug use: No  . Sexual activity: Not  Currently    Partners: Male    Comment: menarche age 48, P 2, first birth age 69, menopause age 72, Premarin x 10 yrs  Other Topics Concern  . Not on file  Social History Narrative   Regular exercise: yes   Right handed    Lives with husband one story home.   Social Determinants of Health   Financial Resource Strain: Not on file  Food Insecurity: Not on file  Transportation Needs: Not on file  Physical Activity: Not on file  Stress: Not on file  Social Connections: Not on file  Intimate Partner Violence: Not on file     PHYSICAL EXAM:  VS: BP (!) 144/86   Ht 5\' 7"  (1.702 m)   Wt 290 lb (131.5 kg)   BMI 45.42 kg/m  Physical Exam Gen: NAD, alert, cooperative with exam, well-appearing MSK:  Left foot: Ecchymosis occurring of the third phalanx. Limited range of motion of the third phalanx. No streaking. Neurovascular intact     ASSESSMENT & PLAN:   Nondisplaced fracture of middle phalanx of left lesser toe(s), initial encounter for closed fracture Initial injury on 4/9.  Fracture confirmed on x-ray. -Counseled on supportive care. -Buddy Taping. -Postop shoe. -Counseled on vitamin D and vitamin K 2.Marland Kitchen

## 2020-09-17 NOTE — Patient Instructions (Signed)
Nice to meet you Please try the post op shoe  Please try the coban   Please send me a message in MyChart with any questions or updates.  Please see me back in 2 weeks.   --Dr. Raeford Razor

## 2020-09-17 NOTE — Assessment & Plan Note (Addendum)
Initial injury on 4/9.  Fracture confirmed on x-ray. -Counseled on supportive care. -Buddy Taping. -Postop shoe. -Counseled on vitamin D and vitamin K 2.Marland Kitchen

## 2020-09-21 ENCOUNTER — Ambulatory Visit: Payer: 59

## 2020-09-27 ENCOUNTER — Encounter: Payer: Self-pay | Admitting: Family

## 2020-09-27 ENCOUNTER — Ambulatory Visit (INDEPENDENT_AMBULATORY_CARE_PROVIDER_SITE_OTHER): Payer: 59 | Admitting: Family

## 2020-09-27 ENCOUNTER — Other Ambulatory Visit: Payer: Self-pay

## 2020-09-27 DIAGNOSIS — I10 Essential (primary) hypertension: Secondary | ICD-10-CM | POA: Diagnosis not present

## 2020-09-27 DIAGNOSIS — F3289 Other specified depressive episodes: Secondary | ICD-10-CM | POA: Diagnosis not present

## 2020-09-27 DIAGNOSIS — J4540 Moderate persistent asthma, uncomplicated: Secondary | ICD-10-CM | POA: Diagnosis not present

## 2020-09-27 DIAGNOSIS — K219 Gastro-esophageal reflux disease without esophagitis: Secondary | ICD-10-CM

## 2020-09-27 MED ORDER — GABAPENTIN 100 MG PO CAPS: 100.0000 mg | ORAL_CAPSULE | Freq: Three times a day (TID) | ORAL | 3 refills | Status: DC

## 2020-09-27 NOTE — Assessment & Plan Note (Signed)
Mildly elevated- acceptable for her age. Will see how she does after we get her pain under control.

## 2020-09-27 NOTE — Assessment & Plan Note (Signed)
Reports mild gerd symptoms.  She continues protonix 40mg .

## 2020-09-27 NOTE — Progress Notes (Signed)
Subjective:   By signing my name below, I, Shehryar Baig, attest that this documentation has been prepared under the direction and in the presence of Debbrah Alar NP. 09/27/2020     Patient ID: Michelle Kennedy, female    DOB: Mar 06, 1956, 65 y.o.   MRN: [id number]  No chief complaint on file.   HPI   Patient is in today for a office visit. She has been prescribed new glasses recently. She has recently broken her toe. Previously to breaking her toe, she participated in exercise by walking daily.   Anxiety/Depression- She has been having increased stress in her daily life recently. Her husband is retiring soon and she is moving in the near future. She is not taking any medication to manage these issues at this time.  Fibromyalgia- She has had worsening symptoms recently. It is affecting her sleep. She does not take medication to manage her issues a this time but is requesting to take tramadol or gabapentin. She has previously taken Cymbalta but found no relief to her symptoms.  GERD- She has had mild symptoms. She is taking 40 mg Protonix to manage her GERD.   Hypertension- Her blood pressure is elevated. She is taking 39m amlodipine daily PO and 20 mg Zestril daily PO to manage her hypertension. BP Readings from Last 3 Encounters:  09/27/20 (!) 144/90  09/17/20 (!) 144/86  09/08/20 138/80   Asthma- She is taking 500 mg Advair to manage her symptoms and finds great relief.   Past Medical History:  Diagnosis Date  . Abnormally small mouth   . Achilles tendon disorder, right   . Anemia   . Anxiety   . Arthritis    hips and knees  . Asthma    daily inhaler, prn inhaler and neb.  . Asthma due to environmental allergies   . Breast cancer ([facility] 01/2014   left  . Chest pain   . COPD (chronic obstructive pulmonary disease) ([facility]   . Dental bridge present    upper front and lower right  . Dental crowns present    x 3  . Depression   . Esophageal spasm    reports since  her chemo and breast surgery  she developed esophageal spasms and reports this is in the past has casue her 02 to desat in the  70s , denies sycnope in relation to this , does report hx of vertigo as well   . Family history of anesthesia complication    twin brother aspirated and died on OR table, per pt.  . Fibromyalgia   . Gallbladder disease   . Gallstones   . GERD (gastroesophageal reflux disease)   . Glaucoma   . H/O blood clots    had blood to  PICC line   . History of gastric ulcer    as a teenager  . History of seizure age 65  as a reaction to Penicillin - no seizures since  . History of stomach ulcers   . History of thyroid cancer    s/p thyroidectomy  . HTN (hypertension)   . Hyperlipidemia   . Hypothyroidism   . Joint pain   . Left knee injury   . Liver disorder    seen when she had her hysterectomy , reports she was told it was  " small lesion" ; but asymptomatic   . Lymphedema    left arm  . Migraines   . Multiple food allergies   . Obesity   .  OSA (obstructive sleep apnea) 02/16/2016   CPAP  . Palpitations    reports no longer experiences  . Personal history of chemotherapy 2015  . Personal history of radiation therapy 2015   Left  . Prediabetes   . Rheumatoid arthritis ([place])   . SOB (shortness of breath)   . Swelling of both ankles   . Tinnitus   . UTI (lower urinary tract infection) 02/17/2014  . Vertigo    last episode  was 2 weeks ago   . Vitamin D deficiency   . Wears contact lenses    left eye only   . Wears hearing aid in both ears     Past Surgical History:  Procedure Laterality Date  . ABDOMINAL HYSTERECTOMY  2004   complete  . ACHILLES TENDON REPAIR Right   . APPENDECTOMY  2004  . BREAST EXCISIONAL BIOPSY    . BREAST LUMPECTOMY Left    2015  . BREAST LUMPECTOMY WITH NEEDLE LOCALIZATION AND AXILLARY SENTINEL LYMPH NODE BX Left 02/23/2014   Procedure: BREAST LUMPECTOMY WITH NEEDLE LOCALIZATION AND AXILLARY SENTINEL LYMPH NODE BIOPSY;   Surgeon: Excell Seltzer, MD;  Location: [facility];  Service: General;  Laterality: Left;  . BREAST SURGERY  2011   left breast biposy  . CHOLECYSTECTOMY  1990  . COLONOSCOPY W/ POLYPECTOMY  06/2009  . EYE SURGERY Right 2013   exc. warts from underneath eyelid  . INCONTINENCE SURGERY  2004  . KNEE ARTHROSCOPY Bilateral    x 6 each knee  . LIGAMENT REPAIR Right    thumb/wrist  . ORIF TOE FRACTURE Right    great toe  . PORTACATH PLACEMENT N/A 03/24/2014   Procedure: INSERTION PORT-A-CATH;  Surgeon: Excell Seltzer, MD;  Location: [facility];  Service: General;  Laterality: N/A;; removed   . THYROIDECTOMY  2000  . TONSILLECTOMY AND ADENOIDECTOMY  2000  . TOTAL KNEE ARTHROPLASTY Left 12/03/2017   Procedure: LEFT TOTAL KNEE ARTHROPLASTY;  Surgeon: Gaynelle Arabian, MD;  Location: [facility];  Service: Orthopedics;  Laterality: Left;  . TUMOR EXCISION     from thoracic spine    Family History  Problem Relation Age of Onset  . Alcohol abuse Mother   . Arthritis Mother   . Hypertension Mother   . Bipolar disorder Mother   . Breast cancer Mother 32       unconfirmed  . Lung cancer Mother 51       smoker  . Hyperlipidemia Mother   . Stroke Mother   . Cancer Mother   . Depression Mother   . Anxiety disorder Mother   . Alcoholism Mother   . Drug abuse Mother   . Eating disorder Mother   . Obesity Mother   . Alcohol abuse Father   . Hyperlipidemia Father   . Kidney disease Father   . Diabetes Father   . Hypertension Father   . Cystic kidney disease Father   . Thyroid disease Father   . Liver disease Father   . Alcoholism Father   . Lung cancer Father   . Arthritis Maternal Grandmother   . Diabetes Paternal Grandmother   . Thyroid cancer Sister 19       type?; currently 42  . Other Sister        ovarian tumor @ 68; TAH/BSO  . Breast cancer Sister   . Thyroid cancer Brother        dx 7s; currently 21  . Breast cancer Maternal Aunt  dx  51s; deceased 53  . Thyroid cancer Paternal Aunt        All 3 paternal aunts with thyroid ca in 30s/40s  . Lung cancer Paternal Aunt        2 of 3 paternal aunts with lung cancer    Social History   Socioeconomic History  . Marital status: Married    Spouse name: Not on file  . Number of children: 2  . Years of education: Not on file  . Highest education level: Not on file  Occupational History  . 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 Smoking status: Never Smoker  . Smokeless tobacco: Never Used  Vaping Use  . Vaping Use: Never used  Substance and Sexual Activity  . Alcohol use: No    Alcohol/week: 0.0 standard drinks  . Drug use: No  . Sexual activity: Not Currently    Partners: Male    Comment: menarche age 56, P 2, first birth age 40, menopause age 28, Premarin x 10 yrs  Other Topics Concern  . Not on file  Social History Narrative   Regular exercise: yes   Right handed    Lives with husband one story home.   Social Determinants of Health   Financial Resource Strain: Not on file  Food Insecurity: Not on file  Transportation Needs: Not on file  Physical Activity: Not on file  Stress: Not on file  Social Connections: Not on file  Intimate Partner Violence: Not on file    Outpatient Medications Prior to Visit  Medication Sig Dispense Refill  . albuterol (PROVENTIL) (5 MG/ML) 0.5% nebulizer solution Take 0.5 mLs (2.5 mg total) by nebulization every 6 (six) hours as needed for wheezing or shortness of breath. 40 mL 3  . albuterol (VENTOLIN HFA) 108 (90 Base) MCG/ACT inhaler USE 2 INHALATIONS ORALLY   EVERY 6 HOURS AS NEEDED FORWHEEZING OR SHORTNESS OF   BREATH. 3 each 1  . ALPHAGAN P 0.1 % SOLN Place 1 drop into both eyes in the morning and at bedtime.     Marland Kitchen amLODipine (NORVASC) 5 MG tablet Take 1 tablet (5 mg total) by mouth daily. 90 tablet 1  . anastrozole (ARIMIDEX) 1 MG tablet Take 1 tablet (1 mg total) by mouth daily. 90 tablet 2   . atorvastatin (LIPITOR) 40 MG tablet Take 1 tablet (40 mg total) by mouth daily. 90 tablet 0  . CALCIUM PO Take 1 tablet by mouth every evening.    . cholecalciferol (VITAMIN D3) 25 MCG (1000 UNIT) tablet Take 1,000 Units by mouth every evening.     . clopidogrel (PLAVIX) 75 MG tablet Take 1 tablet (75 mg total) by mouth daily. 90 tablet 3  . Fluticasone-Salmeterol (WIXELA INHUB) 500-50 MCG/DOSE AEPB Inhale 1 puff into the lungs 2 (two) times daily. 60 each 5  . furosemide (LASIX) 20 MG tablet TAKE 1 TABLET(20 MG) BY MOUTH DAILY AS NEEDED FOR SWELLING 30 tablet 3  . GEMTESA 75 MG TABS Take 1 tablet by mouth daily.    Marland Kitchen levothyroxine (SYNTHROID) 200 MCG tablet Take 1 tablet (200 mcg total) by mouth daily before breakfast. 90 tablet 1  . lisinopril (ZESTRIL) 20 MG tablet Take 2 tablets (40 mg total) by mouth daily. 135 tablet 1  . methocarbamol (ROBAXIN) 500 MG tablet Take 1 tablet (500 mg total) by mouth every 8 (eight) hours as needed for muscle spasms. 60 tablet 1  . Multiple Vitamin (MULTIVITAMIN ADULT)  TABS Take 1 tablet by mouth daily.    . Multiple Vitamins-Minerals (OCUVITE PO) Take 1 tablet by mouth daily.    Marland Kitchen MYRBETRIQ 50 MG TB24 tablet TAKE 1 TABLET BY MOUTH  DAILY 90 tablet 1  . Omega-3 Fatty Acids (FISH OIL) 1000 MG CAPS Take 1 capsule by mouth every evening.     . pantoprazole (PROTONIX) 40 MG tablet Take 1 tablet (40 mg total) by mouth daily. 90 tablet 1  . traMADol (ULTRAM) 50 MG tablet Take 1 tablet (50 mg total) by mouth every 6 (six) hours as needed. 30 tablet 0   No facility-administered medications prior to visit.    Allergies  Allergen Reactions  . Bee Venom Anaphylaxis  . Codeine Shortness Of Breath and Rash  . Contrast Media [Iodinated Diagnostic Agents] Shortness Of Breath  . Iodine Other (See Comments)    CARDIAC ARREST  . Latex Anaphylaxis and Rash  . Lidocaine Shortness Of Breath and Swelling    SWELLING OF MOUTH AND THROAT, low BP  . Penicillins Shortness  Of Breath, Rash and Other (See Comments)    SEIZURE Did it involve swelling of the face/tongue/throat, SOB, or low BP? no Did it involve sudden or severe rash/hives, skin peeling, or any reaction on the inside of your mouth or nose? yes Did you need to seek medical attention at a hospital or doctor's office? yes When did it last happen?2010 If all above answers are "NO", may proceed with cephalosporin use.   Marland Kitchen Pentazocine Lactate Shortness Of Breath and Rash  . Shellfish Allergy Shortness Of Breath and Rash  . Aspirin Rash and Other (See Comments)    GI UPSET  . Erythromycin Swelling and Rash    SWELLING OF JOINTS  . Symbicort [Budesonide-Formoterol Fumarate] Other (See Comments)    BURNING OF TONGUE AND LIPS  . Betadine [Povidone Iodine]     Rash. Breathing problems.   . Compazine [Prochlorperazine Maleate] Rash    Rash on face,chest, arms, back  . Sulfonamide Derivatives Rash    ROS     Objective:    Physical Exam Constitutional:      Appearance: Normal appearance.  HENT:     Head: Normocephalic and atraumatic.     Right Ear: External ear normal.     Left Ear: External ear normal.  Eyes:     Extraocular Movements: Extraocular movements intact.     Pupils: Pupils are equal, round, and reactive to light.  Cardiovascular:     Rate and Rhythm: Normal rate and regular rhythm.     Pulses: Normal pulses.     Heart sounds: Normal heart sounds.  Pulmonary:     Effort: Pulmonary effort is normal. No respiratory distress.     Breath sounds: Normal breath sounds. No stridor. No wheezing, rhonchi or rales.  Skin:    General: Skin is warm and dry.  Neurological:     Mental Status: She is alert and oriented to person, place, and time.  Psychiatric:        Mood and Affect: Affect is tearful.        Behavior: Behavior normal.     There were no vitals taken for this visit. Wt Readings from Last 3 Encounters:  09/17/20 290 lb (131.5 kg)  09/08/20 291 lb (132 kg)   08/19/20 292 lb (132.5 kg)    Diabetic Foot Exam - Simple   No data filed    Lab Results  Component Value Date   WBC 7.4 12/30/2019  HGB 14.5 12/30/2019   HCT 46.1 (H) 12/30/2019   PLT 294 12/30/2019   GLUCOSE 82 06/28/2020   CHOL 164 06/28/2020   TRIG 157.0 (H) 06/28/2020   HDL 55.10 06/28/2020   LDLDIRECT 117.0 02/26/2019   LDLCALC 77 06/28/2020   ALT 41 08/23/2019   AST 22 08/23/2019   NA 140 06/28/2020   K 4.4 06/28/2020   CL 103 06/28/2020   CREATININE 0.83 06/28/2020   BUN 12 06/28/2020   CO2 31 06/28/2020   TSH 0.65 03/01/2020   INR 1.0 08/23/2019   HGBA1C 5.8 (H) 03/01/2020    Lab Results  Component Value Date   TSH 0.65 03/01/2020   Lab Results  Component Value Date   WBC 7.4 12/30/2019   HGB 14.5 12/30/2019   HCT 46.1 (H) 12/30/2019   MCV 78.4 (L) 12/30/2019   PLT 294 12/30/2019   Lab Results  Component Value Date   NA 140 06/28/2020   K 4.4 06/28/2020   CHLORIDE 107 01/23/2017   CO2 31 06/28/2020   GLUCOSE 82 06/28/2020   BUN 12 06/28/2020   CREATININE 0.83 06/28/2020   BILITOT 0.9 08/23/2019   ALKPHOS 62 08/23/2019   AST 22 08/23/2019   ALT 41 08/23/2019   PROT 5.9 (L) 08/23/2019   ALBUMIN 3.4 (L) 08/23/2019   CALCIUM 9.7 06/28/2020   ANIONGAP 10 12/30/2019   EGFR 57 (L) 01/23/2017   GFR 73.66 06/28/2020   Lab Results  Component Value Date   CHOL 164 06/28/2020   Lab Results  Component Value Date   HDL 55.10 06/28/2020   Lab Results  Component Value Date   LDLCALC 77 06/28/2020   Lab Results  Component Value Date   TRIG 157.0 (H) 06/28/2020   Lab Results  Component Value Date   CHOLHDL 3 06/28/2020   Lab Results  Component Value Date   HGBA1C 5.8 (H) 03/01/2020       Assessment & Plan:   Problem List Items Addressed This Visit   None      No orders of the defined types were placed in this encounter.   I, Debbrah Alar NP, personally preformed the services described in this documentation.  All  medical record entries made by the scribe were at my direction and in my presence.  I have reviewed the chart and discharge instructions (if applicable) and agree that the record reflects my personal performance and is accurate and complete. 09/27/2020   I,Shehryar Baig,acting as a scribe for Nance Pear, NP.,have documented all relevant documentation on the behalf of Nance Pear, NP,as directed by  Nance Pear, NP while in the presence of Nance Pear, NP.   Shehryar [employer]

## 2020-09-27 NOTE — Assessment & Plan Note (Signed)
BP Readings from Last 3 Encounters:  09/27/20 (!) 144/90  09/17/20 (!) 144/86  09/08/20 138/80   On amlodipine 5mg  and lisinopril 20mg .

## 2020-09-27 NOTE — Assessment & Plan Note (Signed)
Stable/improved on advair 500mg  bid. Continue along with prn albuterol.

## 2020-09-27 NOTE — Assessment & Plan Note (Signed)
Uncontrolled pain. Will rx with gabapentin 100mg  tid.

## 2020-09-27 NOTE — Patient Instructions (Signed)
Please begin gabapentin 100mg  3x daily for fibromyalgia pain.

## 2020-09-27 NOTE — Assessment & Plan Note (Signed)
Uncontrolled. Discussed adding medication- she wishes to see how she feels once she gets her pain under control.  Would consider elavil if depression and fibromyalgia remain uncontrolled.

## 2020-09-29 ENCOUNTER — Encounter: Payer: Self-pay | Admitting: Family

## 2020-09-30 ENCOUNTER — Ambulatory Visit
Admission: RE | Admit: 2020-09-30 | Discharge: 2020-09-30 | Disposition: A | Discharge status: Home / Self Care | Payer: 59 | Source: Ambulatory Visit | Attending: Hematology and Oncology | Admitting: Hematology and Oncology

## 2020-09-30 ENCOUNTER — Other Ambulatory Visit: Payer: Self-pay

## 2020-09-30 DIAGNOSIS — Z1231 Encounter for screening mammogram for malignant neoplasm of breast: Secondary | ICD-10-CM

## 2020-10-01 ENCOUNTER — Ambulatory Visit (INDEPENDENT_AMBULATORY_CARE_PROVIDER_SITE_OTHER): Payer: 59 | Admitting: Family Medicine

## 2020-10-01 VITALS — BP 142/80 | Ht 67.0 in | Wt 290.0 lb

## 2020-10-01 DIAGNOSIS — S92525A Nondisplaced fracture of medial phalanx of left lesser toe(s), initial encounter for closed fracture: Secondary | ICD-10-CM | POA: Diagnosis not present

## 2020-10-01 DIAGNOSIS — Z13820 Encounter for screening for osteoporosis: Secondary | ICD-10-CM

## 2020-10-01 NOTE — Patient Instructions (Signed)
Good to see you Please try ice as needed  Please continue support for the toe  I will call with the results from the bone density   Please send me a message in MyChart with any questions or updates.  Please see [place] back as needed.   --Dr. Raeford Razor

## 2020-10-01 NOTE — Assessment & Plan Note (Addendum)
Injury occurred on 4/9.  Has some swelling but improvement of pain and function. -Counseled on home exercise therapy care. -Counseled on buddy taping. -Her fracture is not osteoporotic but will get updated bone density for screening for osteoporosis.

## 2020-10-01 NOTE — Progress Notes (Signed)
Michelle Kennedy - 65 y.o. female MRN [id number]  Date of birth: 1955/12/02  SUBJECTIVE:  Including CC & ROS.  No chief complaint on file.   Michelle Kennedy is a 65 y.o. female that is following up for her toe fracture.  She still gets swelling and pain from time to time.  Overall she is able to walk with little pain.   Review of Systems See HPI   HISTORY: Past Medical, Surgical, Social, and Family History Reviewed & Updated per EMR.   Pertinent Historical Findings include:  Past Medical History:  Diagnosis Date  . Abnormally small mouth   . Achilles tendon disorder, right   . Anemia   . Anxiety   . Arthritis    hips and knees  . Asthma    daily inhaler, prn inhaler and neb.  . Asthma due to environmental allergies   . Breast cancer ([facility]) 01/2014   left  . Chest pain   . COPD (chronic obstructive pulmonary disease) ([place])   . Dental bridge present    upper front and lower right  . Dental crowns present    x 3  . Depression   . Esophageal spasm    reports since her chemo and breast surgery  she developed esophageal spasms and reports this is in the past has casue her 02 to desat in the  70s , denies sycnope in relation to this , does report hx of vertigo as well   . Family history of anesthesia complication    twin brother aspirated and died on OR table, per pt.  . Fibromyalgia   . Gallbladder disease   . Gallstones   . GERD (gastroesophageal reflux disease)   . Glaucoma   . H/O blood clots    had blood to  PICC line   . History of gastric ulcer    as a teenager  . History of seizure age 17   as a reaction to Penicillin - no seizures since  . History of stomach ulcers   . History of thyroid cancer    s/p thyroidectomy  . HTN (hypertension)   . Hyperlipidemia   . Hypothyroidism   . Joint pain   . Left knee injury   . Liver disorder    seen when she had her hysterectomy , reports she was told it was  " small lesion" ; but asymptomatic   . Lymphedema    left  arm  . Migraines   . Multiple food allergies   . Obesity   . OSA (obstructive sleep apnea) 02/16/2016   CPAP  . Palpitations    reports no longer experiences  . Personal history of chemotherapy 2015  . Personal history of radiation therapy 2015   Left  . Prediabetes   . Rheumatoid arthritis ([place])   . SOB (shortness of breath)   . Swelling of both ankles   . Tinnitus   . UTI (lower urinary tract infection) 02/17/2014  . Vertigo    last episode  was 2 weeks ago   . Vitamin D deficiency   . Wears contact lenses    left eye only   . Wears hearing aid in both ears     Past Surgical History:  Procedure Laterality Date  . ABDOMINAL HYSTERECTOMY  2004   complete  . ACHILLES TENDON REPAIR Right   . APPENDECTOMY  2004  . BREAST EXCISIONAL BIOPSY    . BREAST LUMPECTOMY Left    2015  . BREAST  LUMPECTOMY WITH NEEDLE LOCALIZATION AND AXILLARY SENTINEL LYMPH NODE BX Left 02/23/2014   Procedure: BREAST LUMPECTOMY WITH NEEDLE LOCALIZATION AND AXILLARY SENTINEL LYMPH NODE BIOPSY;  Surgeon: Excell Seltzer, MD;  Location: [facility];  Service: General;  Laterality: Left;  . BREAST SURGERY  2011   left breast biposy  . CHOLECYSTECTOMY  1990  . COLONOSCOPY W/ POLYPECTOMY  06/2009  . EYE SURGERY Right 2013   exc. warts from underneath eyelid  . INCONTINENCE SURGERY  2004  . KNEE ARTHROSCOPY Bilateral    x 6 each knee  . LIGAMENT REPAIR Right    thumb/wrist  . ORIF TOE FRACTURE Right    great toe  . PORTACATH PLACEMENT N/A 03/24/2014   Procedure: INSERTION PORT-A-CATH;  Surgeon: Excell Seltzer, MD;  Location: [facility];  Service: General;  Laterality: N/A;; removed   . THYROIDECTOMY  2000  . TONSILLECTOMY AND ADENOIDECTOMY  2000  . TOTAL KNEE ARTHROPLASTY Left 12/03/2017   Procedure: LEFT TOTAL KNEE ARTHROPLASTY;  Surgeon: Gaynelle Arabian, MD;  Location[facility];  Service: Orthopedics;  Laterality: Left;  . TUMOR EXCISION     from thoracic spine     Family History  Problem Relation Age of Onset  . Alcohol abuse Mother   . Arthritis Mother   . Hypertension Mother   . Bipolar disorder Mother   . Breast cancer Mother 7       unconfirmed  . Lung cancer Mother 21       smoker  . Hyperlipidemia Mother   . Stroke Mother   . Cancer Mother   . Depression Mother   . Anxiety disorder Mother   . Alcoholism Mother   . Drug abuse Mother   . Eating disorder Mother   . Obesity Mother   . Alcohol abuse Father   . Hyperlipidemia Father   . Kidney disease Father   . Diabetes Father   . Hypertension Father   . Cystic kidney disease Father   . Thyroid disease Father   . Liver disease Father   . Alcoholism Father   . Lung cancer Father   . Arthritis Maternal Grandmother   . Diabetes Paternal Grandmother   . Thyroid cancer Sister 27       type?; currently 53  . Other Sister        ovarian tumor @ 85; TAH/BSO  . Breast cancer Sister   . Thyroid cancer Brother        dx 22s; currently 64  . Breast cancer Maternal Aunt        dx 42s; deceased 34  . Thyroid cancer Paternal Aunt        All 3 paternal aunts with thyroid ca in 30s/40s  . Lung cancer Paternal Aunt        2 of 3 paternal aunts with lung cancer    Social History   Socioeconomic History  . Marital status: Married    Spouse name: Not on file  . Number of children: 2  . Years of education: Not on file  . Highest education level: Not on file  Occupational History  . 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 Smoking status: Never Smoker  . Smokeless tobacco: Never Used  Vaping Use  . Vaping Use: Never used  Substance and Sexual Activity  . Alcohol use: No    Alcohol/week: 0.0 standard drinks  . Drug use: No  . Sexual activity: Not Currently  Partners: Male    Comment: menarche age 44, 49 2, first birth age 21, menopause age 41, Premarin x 10 yrs  Other Topics Concern  . Not on file  Social History Narrative   Regular exercise: yes    Right handed    Lives with husband one story home.   Social Determinants of Health   Financial Resource Strain: Not on file  Food Insecurity: Not on file  Transportation Needs: Not on file  Physical Activity: Not on file  Stress: Not on file  Social Connections: Not on file  Intimate Partner Violence: Not on file     PHYSICAL EXAM:  VS: BP (!) 142/80   Ht 5\' 7"  (1.702 m)   Wt 290 lb (131.5 kg)   BMI 45.42 kg/m  Physical Exam Gen: NAD, alert, cooperative with exam, well-appearing    ASSESSMENT & PLAN:   Nondisplaced fracture of middle phalanx of left lesser toe(s), initial encounter for closed fracture Injury occurred on 4/9.  Has some swelling but improvement of pain and function. -Counseled on home exercise therapy care. -Counseled on buddy taping. -Her fracture is not osteoporotic but will get updated bone density for screening for osteoporosis.

## 2020-10-05 ENCOUNTER — Other Ambulatory Visit: Payer: Self-pay

## 2020-10-05 ENCOUNTER — Ambulatory Visit (HOSPITAL_BASED_OUTPATIENT_CLINIC_OR_DEPARTMENT_OTHER)
Admission: RE | Admit: 2020-10-05 | Discharge: 2020-10-05 | Disposition: A | Discharge status: Home / Self Care | Payer: 59 | Source: Ambulatory Visit | Attending: Family Medicine | Admitting: Family Medicine

## 2020-10-05 DIAGNOSIS — Z13820 Encounter for screening for osteoporosis: Secondary | ICD-10-CM | POA: Insufficient documentation

## 2020-10-06 ENCOUNTER — Telehealth: Payer: Self-pay | Admitting: Family Medicine

## 2020-10-06 NOTE — Telephone Encounter (Signed)
Informed of results.   Rosemarie Ax, MD [facility] 10/06/2020, 8:19 AM

## 2020-10-10 ENCOUNTER — Encounter: Payer: Self-pay | Admitting: Family

## 2020-10-11 ENCOUNTER — Other Ambulatory Visit: Payer: Self-pay

## 2020-10-11 MED ORDER — GABAPENTIN 100 MG PO CAPS: 200.0000 mg | ORAL_CAPSULE | Freq: Three times a day (TID) | ORAL | 3 refills | Status: DC

## 2020-10-11 MED ORDER — AMLODIPINE BESYLATE 5 MG PO TABS: 5.0000 mg | ORAL_TABLET | Freq: Every day | ORAL | 1 refills | Status: DC

## 2020-10-14 ENCOUNTER — Other Ambulatory Visit: Payer: Self-pay

## 2020-10-14 ENCOUNTER — Encounter: Payer: Self-pay | Admitting: Family

## 2020-10-14 ENCOUNTER — Ambulatory Visit (INDEPENDENT_AMBULATORY_CARE_PROVIDER_SITE_OTHER): Payer: 59 | Admitting: Family

## 2020-10-14 VITALS — BP 160/80 | HR 89 | Temp 97.8°F | Ht 67.0 in | Wt 288.2 lb

## 2020-10-14 DIAGNOSIS — R350 Frequency of micturition: Secondary | ICD-10-CM

## 2020-10-14 DIAGNOSIS — R300 Dysuria: Secondary | ICD-10-CM | POA: Diagnosis not present

## 2020-10-14 LAB — POCT URINALYSIS DIP (MANUAL ENTRY)
Bilirubin, UA: NEGATIVE
Blood, UA: NEGATIVE
Glucose, UA: NEGATIVE mg/dL
Ketones, POC UA: NEGATIVE mg/dL
Leukocytes, UA: NEGATIVE
Nitrite, UA: NEGATIVE
Spec Grav, UA: 1.015 (ref 1.010–1.025)
Urobilinogen, UA: 0.2 E.U./dL
pH, UA: 6.5 (ref 5.0–8.0)

## 2020-10-14 MED ORDER — NITROFURANTOIN MONOHYD MACRO 100 MG PO CAPS: 100.0000 mg | ORAL_CAPSULE | Freq: Two times a day (BID) | ORAL | 0 refills | Status: DC

## 2020-10-14 NOTE — Progress Notes (Signed)
Michelle Kennedy is a 65 y.o. female with the following history as recorded in EpicCare:  Patient Active Problem List   Diagnosis Date Noted  . Nondisplaced fracture of middle phalanx of left lesser toe(s), initial encounter for closed fracture 09/17/2020  . Chronic diastolic CHF (congestive heart failure) ([place]) 08/23/2019  . Retinal artery occlusion 08/22/2019  . OA (osteoarthritis) of knee 12/03/2017  . Class 3 obesity without serious comorbidity with body mass index (BMI) of 40.0 to 44.9 in adult 01/01/2017  . Essential hypertension 10/18/2016  . Constipation 10/04/2016  . Vitamin D deficiency 10/04/2016  . Prediabetes 10/04/2016  . Obesity, Class III, BMI 40-49.9 (morbid obesity) ([place]) 09/20/2016  . OSA (obstructive sleep apnea) 02/16/2016  . Hypertension 10/11/2015  . Malignant neoplasm of thyroid gland ([place]) 10/11/2015  . De Quervain's tenosynovitis, bilateral 09/15/2015  . Trigger finger, acquired 06/22/2015  . Chemotherapy-induced peripheral neuropathy ([place]) 05/10/2015  . Postmenopausal osteoporosis 12/08/2014  . Preventative health care 12/01/2014  . DVT (deep venous thrombosis) ([place]) 08/28/2014  . Lymphedema of arm 08/28/2014  . Mucositis due to chemotherapy 06/02/2014  . Menopausal syndrome (hot flashes) 12/31/2013  . Breast cancer of upper-outer quadrant of left female breast ([place]) 12/26/2013  . 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 Gingivitis 12/05/2013  . Seasonal allergies 09/24/2013  . Hyperlipidemia 06/02/2011  . Migraine 06/02/2011  . Tinnitus of both ears 02/14/2011  . FIBROMYALGIA 05/10/2010  . Asthma 03/22/2010  . 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 GERD 12/20/2009  . Depression 12/01/2009    Current Outpatient Medications  Medication Sig Dispense Refill  . albuterol (PROVENTIL) (5 MG/ML) 0.5% nebulizer solution Take 0.5 mLs (2.5 mg total) by nebulization every 6 (six) hours as needed for wheezing or shortness of breath. 40  mL 3  . albuterol (VENTOLIN HFA) 108 (90 Base) MCG/ACT inhaler USE 2 INHALATIONS ORALLY   EVERY 6 HOURS AS NEEDED FORWHEEZING OR SHORTNESS OF   BREATH. 3 each 1  . ALPHAGAN P 0.1 % SOLN Place 1 drop into both eyes in the morning and at bedtime.     Marland Kitchen amLODipine (NORVASC) 5 MG tablet Take 1 tablet (5 mg total) by mouth daily. 90 tablet 1  . anastrozole (ARIMIDEX) 1 MG tablet Take 1 tablet (1 mg total) by mouth daily. 90 tablet 2  . atorvastatin (LIPITOR) 40 MG tablet Take 1 tablet (40 mg total) by mouth daily. 90 tablet 0  . CALCIUM PO Take 1 tablet by mouth every evening.    . cholecalciferol (VITAMIN D3) 25 MCG (1000 UNIT) tablet Take 1,000 Units by mouth every evening.     . clopidogrel (PLAVIX) 75 MG tablet Take 1 tablet (75 mg total) by mouth daily. 90 tablet 3  . Fluticasone-Salmeterol (WIXELA INHUB) 500-50 MCG/DOSE AEPB Inhale 1 puff into the lungs 2 (two) times daily. 60 each 5  . furosemide (LASIX) 20 MG tablet TAKE 1 TABLET(20 MG) BY MOUTH DAILY AS NEEDED FOR SWELLING 30 tablet 3  . gabapentin (NEURONTIN) 100 MG capsule Take 2 capsules (200 mg total) by mouth 3 (three) times daily. 180 capsule 3  . GEMTESA 75 MG TABS Take 1 tablet by mouth daily.    Marland Kitchen levothyroxine (SYNTHROID) 200 MCG tablet Take 1 tablet (200 mcg total) by mouth daily before breakfast. 90 tablet 1  . lisinopril (ZESTRIL) 20 MG tablet Take 2 tablets (40 mg total) by mouth daily. 135 tablet 1  . methocarbamol (ROBAXIN) 500 MG tablet Take 1 tablet (500 mg  total) by mouth every 8 (eight) hours as needed for muscle spasms. 60 tablet 1  . Multiple Vitamin (MULTIVITAMIN ADULT) TABS Take 1 tablet by mouth daily.    . Multiple Vitamins-Minerals (OCUVITE PO) Take 1 tablet by mouth daily.    Marland Kitchen MYRBETRIQ 50 MG TB24 tablet TAKE 1 TABLET BY MOUTH  DAILY 90 tablet 1  . nitrofurantoin, macrocrystal-monohydrate, (MACROBID) 100 MG capsule Take 1 capsule (100 mg total) by mouth 2 (two) times daily. 14 capsule 0  . Omega-3 Fatty Acids  (FISH OIL) 1000 MG CAPS Take 1 capsule by mouth every evening.     . pantoprazole (PROTONIX) 40 MG tablet Take 1 tablet (40 mg total) by mouth daily. 90 tablet 1  . traMADol (ULTRAM) 50 MG tablet Take 1 tablet (50 mg total) by mouth every 6 (six) hours as needed. 30 tablet 0   No current facility-administered medications for this visit.    Allergies: Bee venom, Codeine, Contrast media [iodinated diagnostic agents], Iodine, Latex, Lidocaine, Penicillins, Pentazocine lactate, Shellfish allergy, Aspirin, Erythromycin, Symbicort [budesonide-formoterol fumarate], Betadine [povidone iodine], Compazine [prochlorperazine maleate], and Sulfonamide derivatives  Past Medical History:  Diagnosis Date  . Abnormally small mouth   . Achilles tendon disorder, right   . Anemia   . Anxiety   . Arthritis    hips and knees  . Asthma    daily inhaler, prn inhaler and neb.  . Asthma due to environmental allergies   . Breast cancer ([place]) 01/2014   left  . Chest pain   . COPD (chronic obstructive pulmonary disease) ([place])   . Dental bridge present    upper front and lower right  . Dental crowns present    x 3  . Depression   . Esophageal spasm    reports since her chemo and breast surgery  she developed esophageal spasms and reports this is in the past has casue her 02 to desat in the  70s , denies sycnope in relation to this , does report hx of vertigo as well   . Family history of anesthesia complication    twin brother aspirated and died on OR table, per pt.  . Fibromyalgia   . Gallbladder disease   . Gallstones   . GERD (gastroesophageal reflux disease)   . Glaucoma   . H/O blood clots    had blood to  PICC line   . History of gastric ulcer    as a teenager  . History of seizure age 37   as a reaction to Penicillin - no seizures since  . History of stomach ulcers   . History of thyroid cancer    s/p thyroidectomy  . HTN (hypertension)   . Hyperlipidemia   . Hypothyroidism   . Joint pain    . Left knee injury   . Liver disorder    seen when she had her hysterectomy , reports she was told it was  " small lesion" ; but asymptomatic   . Lymphedema    left arm  . Migraines   . Multiple food allergies   . Obesity   . OSA (obstructive sleep apnea) 02/16/2016   CPAP  . Palpitations    reports no longer experiences  . Personal history of chemotherapy 2015  . Personal history of radiation therapy 2015   Left  . Prediabetes   . Rheumatoid arthritis ([place])   . SOB (shortness of breath)   . Swelling of both ankles   . Tinnitus   .  UTI (lower urinary tract infection) 02/17/2014  . Vertigo    last episode  was 2 weeks ago   . Vitamin D deficiency   . Wears contact lenses    left eye only   . Wears hearing aid in both ears     Past Surgical History:  Procedure Laterality Date  . ABDOMINAL HYSTERECTOMY  2004   complete  . ACHILLES TENDON REPAIR Right   . APPENDECTOMY  2004  . BREAST EXCISIONAL BIOPSY    . BREAST LUMPECTOMY Left    2015  . BREAST LUMPECTOMY WITH NEEDLE LOCALIZATION AND AXILLARY SENTINEL LYMPH NODE BX Left 02/23/2014   Procedure: BREAST LUMPECTOMY WITH NEEDLE LOCALIZATION AND AXILLARY SENTINEL LYMPH NODE BIOPSY;  Surgeon: Excell Seltzer, MD;  Location: [facility];  Service: General;  Laterality: Left;  . BREAST SURGERY  2011   left breast biposy  . CHOLECYSTECTOMY  1990  . COLONOSCOPY W/ POLYPECTOMY  06/2009  . EYE SURGERY Right 2013   exc. warts from underneath eyelid  . INCONTINENCE SURGERY  2004  . KNEE ARTHROSCOPY Bilateral    x 6 each knee  . LIGAMENT REPAIR Right    thumb/wrist  . ORIF TOE FRACTURE Right    great toe  . PORTACATH PLACEMENT N/A 03/24/2014   Procedure: INSERTION PORT-A-CATH;  Surgeon: Excell Seltzer, MD;  Location: [facility];  Service: General;  Laterality: N/A;; removed   . THYROIDECTOMY  2000  . TONSILLECTOMY AND ADENOIDECTOMY  2000  . TOTAL KNEE ARTHROPLASTY Left 12/03/2017   Procedure: LEFT  TOTAL KNEE ARTHROPLASTY;  Surgeon: Gaynelle Arabian, MD;  Location: [facility];  Service: Orthopedics;  Laterality: Left;  . TUMOR EXCISION     from thoracic spine    Family History  Problem Relation Age of Onset  . Alcohol abuse Mother   . Arthritis Mother   . Hypertension Mother   . Bipolar disorder Mother   . Breast cancer Mother 67       unconfirmed  . Lung cancer Mother 89       smoker  . Hyperlipidemia Mother   . Stroke Mother   . Cancer Mother   . Depression Mother   . Anxiety disorder Mother   . Alcoholism Mother   . Drug abuse Mother   . Eating disorder Mother   . Obesity Mother   . Alcohol abuse Father   . Hyperlipidemia Father   . Kidney disease Father   . Diabetes Father   . Hypertension Father   . Cystic kidney disease Father   . Thyroid disease Father   . Liver disease Father   . Alcoholism Father   . Lung cancer Father   . Arthritis Maternal Grandmother   . Diabetes Paternal Grandmother   . Thyroid cancer Sister 28       type?; currently 48  . Other Sister        ovarian tumor @ 72; TAH/BSO  . Breast cancer Sister   . Thyroid cancer Brother        dx 7s; currently 35  . Breast cancer Maternal Aunt        dx 90s; deceased 93  . Thyroid cancer Paternal Aunt        All 3 paternal aunts with thyroid ca in 30s/40s  . Lung cancer Paternal Aunt        2 of 3 paternal aunts with lung cancer    Social History   Tobacco Use  . Smoking status: Never  Smoker  . Smokeless tobacco: Never Used  Substance Use Topics  . Alcohol use: No    Alcohol/week: 0.0 standard drinks    Subjective:  Patient presents with concern for urinary frequency/ left upper flank pain x 1 week. No fever, no blood in urine; incomplete emptying; has started going back to the pool regularly- notes that one time in the past she did develop a UTI after starting to swim more frequently; Has had a kidney stone in her 27s but nothing since that time;     Objective:  Vitals:   10/14/20 1015   BP: (!) 160/80  Pulse: 89  Temp: 97.8 F (36.6 C)  TempSrc: Oral  SpO2: 99%  Weight: 288 lb 3.2 oz (130.7 kg)  Height: 5\' 7"  (1.702 m)    General: Well developed, well nourished, in no acute distress  Skin : Warm and dry.  Head: Normocephalic and atraumatic  Eyes: Sclera and conjunctiva clear; pupils round and reactive to light; extraocular movements intact  Lungs: Respirations unlabored; clear to auscultation bilaterally without wheeze, rales, rhonchi  CVS exam: normal rate and regular rhythm.  Neurologic: Alert and oriented; speech intact; face symmetrical; moves all extremities well; CNII-XII intact without focal deficit   Assessment:  1. Urinary frequency   2. Dysuria     Plan:  Check U/A and urine culture; Rx for Macrobid 100 mg bid x 7 days; if culture is negative, will plan for imaging to evaluate for source of symptoms; continue to drink increased water; strict ER precautions discussed;  This visit occurred during the SARS-CoV-2 public health emergency.  Safety protocols were in place, including screening questions prior to the visit, additional usage of staff PPE, and extensive cleaning of exam room while observing appropriate contact time as indicated for disinfecting solutions.      No follow-ups on file.  Orders Placed This Encounter  Procedures  . Urine Culture  . POCT urinalysis dipstick    Requested Prescriptions   Signed Prescriptions Disp Refills  . nitrofurantoin, macrocrystal-monohydrate, (MACROBID) 100 MG capsule 14 capsule 0    Sig: Take 1 capsule (100 mg total) by mouth 2 (two) times daily.

## 2020-10-15 LAB — URINE CULTURE
MICRO NUMBER:: 11938467
SPECIMEN QUALITY:: ADEQUATE

## 2020-10-21 ENCOUNTER — Other Ambulatory Visit: Payer: Self-pay | Admitting: Family Medicine

## 2020-10-26 ENCOUNTER — Other Ambulatory Visit: Payer: Self-pay

## 2020-10-26 ENCOUNTER — Ambulatory Visit (INDEPENDENT_AMBULATORY_CARE_PROVIDER_SITE_OTHER): Payer: 59 | Admitting: Family

## 2020-10-26 VITALS — BP 128/75 | HR 79 | Temp 98.9°F | Resp 16 | Wt 287.0 lb

## 2020-10-26 DIAGNOSIS — R7303 Prediabetes: Secondary | ICD-10-CM | POA: Diagnosis not present

## 2020-10-26 DIAGNOSIS — M797 Fibromyalgia: Secondary | ICD-10-CM | POA: Diagnosis not present

## 2020-10-26 DIAGNOSIS — R739 Hyperglycemia, unspecified: Secondary | ICD-10-CM

## 2020-10-26 LAB — HEMOGLOBIN A1C: Hgb A1c MFr Bld: 6.2 % (ref 4.6–6.5)

## 2020-10-26 NOTE — Progress Notes (Signed)
Established Patient Office Visit  Subjective:  Patient ID: Michelle Kennedy, female    DOB: [birth year]  Age: 65 y.o. MRN: [id number]  CC: No chief complaint on file.   HPI [facility] presents for follow up .  Last visit we added gabapentin 171m 3 times daily for fibromyalgia pain.  We increased to 2058mTID. She is no longer in pain.  Feels more flexible, feels less depressed, feel more flexible.   Wt Readings from Last 3 Encounters:  10/26/20 287 lb (130.2 kg)  10/14/20 288 lb 3.2 oz (130.7 kg)  10/01/20 290 lb (131.5 kg)    Reports AM fasting sugars 120's.  Lab Results  Component Value Date   HGBA1C 5.8 (H) 03/01/2020    Past Medical History:  Diagnosis Date  . Abnormally small mouth   . Achilles tendon disorder, right   . Anemia   . Anxiety   . Arthritis    hips and knees  . Asthma    daily inhaler, prn inhaler and neb.  . Asthma due to environmental allergies   . Breast cancer ([place]   left  . Chest pain   . COPD (chronic obstructive pulmonary disease) ([place]  . Dental bridge present    upper front and lower right  . Dental crowns present    x 3  . Depression   . Esophageal spasm    reports since her chemo and breast surgery  she developed esophageal spasms and reports this is in the past has casue her 02 to desat in the  70s , denies sycnope in relation to this , does report hx of vertigo as well   . Family history of anesthesia complication    twin brother aspirated and died on OR table, per pt.  . Fibromyalgia   . Gallbladder disease   . Gallstones   . GERD (gastroesophageal reflux disease)   . Glaucoma   . H/O blood clots    had blood to  PICC line   . History of gastric ulcer    as a teenager  . History of seizure age 65 as a reaction to Penicillin - no seizures since  . History of stomach ulcers   . History of thyroid cancer    s/p thyroidectomy  . HTN (hypertension)   . Hyperlipidemia   . Hypothyroidism   . Joint pain   .  Left knee injury   . Liver disorder    seen when she had her hysterectomy , reports she was told it was  " small lesion" ; but asymptomatic   . Lymphedema    left arm  . Migraines   . Multiple food allergies   . Obesity   . OSA (obstructive sleep apnea) 02/16/2016   CPAP  . Palpitations    reports no longer experiences  . Personal history of chemotherapy 2015  . Personal history of radiation therapy 2015   Left  . Prediabetes   . Rheumatoid arthritis ([place]  . SOB (shortness of breath)   . Swelling of both ankles   . Tinnitus   . UTI (lower urinary tract infection) 02/17/2014  . Vertigo    last episode  was 2 weeks ago   . Vitamin D deficiency   . Wears contact lenses    left eye only   . Wears hearing aid in both ears     Past Surgical History:  Procedure Laterality Date  . ABDOMINAL HYSTERECTOMY  2004   complete  . ACHILLES TENDON REPAIR Right   . APPENDECTOMY  2004  . BREAST EXCISIONAL BIOPSY    . BREAST LUMPECTOMY Left    2015  . BREAST LUMPECTOMY WITH NEEDLE LOCALIZATION AND AXILLARY SENTINEL LYMPH NODE BX Left 02/23/2014   Procedure: BREAST LUMPECTOMY WITH NEEDLE LOCALIZATION AND AXILLARY SENTINEL LYMPH NODE BIOPSY;  Surgeon: Excell Seltzer, MD;  Location: [facility];  Service: General;  Laterality: Left;  . BREAST SURGERY  2011   left breast biposy  . CHOLECYSTECTOMY  1990  . COLONOSCOPY W/ POLYPECTOMY  06/2009  . EYE SURGERY Right 2013   exc. warts from underneath eyelid  . INCONTINENCE SURGERY  2004  . KNEE ARTHROSCOPY Bilateral    x 6 each knee  . LIGAMENT REPAIR Right    thumb/wrist  . ORIF TOE FRACTURE Right    great toe  . PORTACATH PLACEMENT N/A 03/24/2014   Procedure: INSERTION PORT-A-CATH;  Surgeon: Excell Seltzer, MD;  Location: [facility];  Service: General;  Laterality: N/A;; removed   . THYROIDECTOMY  2000  . TONSILLECTOMY AND ADENOIDECTOMY  2000  . TOTAL KNEE ARTHROPLASTY Left 12/03/2017   Procedure: LEFT  TOTAL KNEE ARTHROPLASTY;  Surgeon: Gaynelle Arabian, MD;  [facility];  Service: Orthopedics;  Laterality: Left;  . TUMOR EXCISION     from thoracic spine    Family History  Problem Relation Age of Onset  . Alcohol abuse Mother   . Arthritis Mother   . Hypertension Mother   . Bipolar disorder Mother   . Breast cancer Mother 31       unconfirmed  . Lung cancer Mother 67       smoker  . Hyperlipidemia Mother   . Stroke Mother   . Cancer Mother   . Depression Mother   . Anxiety disorder Mother   . Alcoholism Mother   . Drug abuse Mother   . Eating disorder Mother   . Obesity Mother   . Alcohol abuse Father   . Hyperlipidemia Father   . Kidney disease Father   . Diabetes Father   . Hypertension Father   . Cystic kidney disease Father   . Thyroid disease Father   . Liver disease Father   . Alcoholism Father   . Lung cancer Father   . Arthritis Maternal Grandmother   . Diabetes Paternal Grandmother   . Thyroid cancer Sister 53       type?; currently 72  . Other Sister        ovarian tumor @ 13; TAH/BSO  . Breast cancer Sister   . Thyroid cancer Brother        dx 68s; currently 78  . Breast cancer Maternal Aunt        dx 49s; deceased 14  . Thyroid cancer Paternal Aunt        All 3 paternal aunts with thyroid ca in 30s/40s  . Lung cancer Paternal Aunt        2 of 3 paternal aunts with lung cancer    Social History   Socioeconomic History  . Marital status: Married    Spouse name: Not on file  . Number of children: 2  . Years of education: Not on file  . Highest education level: Not on file  Occupational History  . 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 Smoking status: Never Smoker  . Smokeless tobacco: Never Used  Vaping Use  .  Vaping Use: Never used  Substance and Sexual Activity  . Alcohol use: No    Alcohol/week: 0.0 standard drinks  . Drug use: No  . Sexual activity: Not Currently    Partners: Male    Comment:  menarche age 85, P 2, first birth age 64, menopause age 62, Premarin x 10 yrs  Other Topics Concern  . Not on file  Social History Narrative   Regular exercise: yes   Right handed    Lives with husband one story home.   Social Determinants of Health   Financial Resource Strain: Not on file  Food Insecurity: Not on file  Transportation Needs: Not on file  Physical Activity: Not on file  Stress: Not on file  Social Connections: Not on file  Intimate Partner Violence: Not on file    Outpatient Medications Prior to Visit  Medication Sig Dispense Refill  . albuterol (PROVENTIL) (5 MG/ML) 0.5% nebulizer solution Take 0.5 mLs (2.5 mg total) by nebulization every 6 (six) hours as needed for wheezing or shortness of breath. 40 mL 3  . albuterol (VENTOLIN HFA) 108 (90 Base) MCG/ACT inhaler USE 2 INHALATIONS ORALLY   EVERY 6 HOURS AS NEEDED FORWHEEZING OR SHORTNESS OF   BREATH. 3 each 1  . ALPHAGAN P 0.1 % SOLN Place 1 drop into both eyes in the morning and at bedtime.     Marland Kitchen amLODipine (NORVASC) 5 MG tablet Take 1 tablet (5 mg total) by mouth daily. 90 tablet 1  . anastrozole (ARIMIDEX) 1 MG tablet Take 1 tablet (1 mg total) by mouth daily. 90 tablet 2  . atorvastatin (LIPITOR) 40 MG tablet Take 1 tablet (40 mg total) by mouth daily. 90 tablet 0  . CALCIUM PO Take 1 tablet by mouth every evening.    . cholecalciferol (VITAMIN D3) 25 MCG (1000 UNIT) tablet Take 1,000 Units by mouth every evening.     . clopidogrel (PLAVIX) 75 MG tablet Take 1 tablet (75 mg total) by mouth daily. 90 tablet 3  . Fluticasone-Salmeterol (WIXELA INHUB) 500-50 MCG/DOSE AEPB Inhale 1 puff into the lungs 2 (two) times daily. 60 each 5  . furosemide (LASIX) 20 MG tablet TAKE 1 TABLET(20 MG) BY MOUTH DAILY AS NEEDED FOR SWELLING 30 tablet 3  . gabapentin (NEURONTIN) 100 MG capsule Take 2 capsules (200 mg total) by mouth 3 (three) times daily. 180 capsule 3  . GEMTESA 75 MG TABS Take 1 tablet by mouth daily.    Marland Kitchen  levothyroxine (SYNTHROID) 200 MCG tablet Take 1 tablet (200 mcg total) by mouth daily before breakfast. 90 tablet 1  . lisinopril (ZESTRIL) 20 MG tablet Take 2 tablets (40 mg total) by mouth daily. 135 tablet 1  . methocarbamol (ROBAXIN) 500 MG tablet TAKE 1 TABLET(500 MG) BY MOUTH EVERY 8 HOURS AS NEEDED FOR MUSCLE SPASMS 60 tablet 1  . Multiple Vitamin (MULTIVITAMIN ADULT) TABS Take 1 tablet by mouth daily.    . Multiple Vitamins-Minerals (OCUVITE PO) Take 1 tablet by mouth daily.    Marland Kitchen MYRBETRIQ 50 MG TB24 tablet TAKE 1 TABLET BY MOUTH  DAILY 90 tablet 1  . Omega-3 Fatty Acids (FISH OIL) 1000 MG CAPS Take 1 capsule by mouth every evening.     . pantoprazole (PROTONIX) 40 MG tablet Take 1 tablet (40 mg total) by mouth daily. 90 tablet 1  . traMADol (ULTRAM) 50 MG tablet Take 1 tablet (50 mg total) by mouth every 6 (six) hours as needed. 30 tablet 0  .  nitrofurantoin, macrocrystal-monohydrate, (MACROBID) 100 MG capsule Take 1 capsule (100 mg total) by mouth 2 (two) times daily. 14 capsule 0   No facility-administered medications prior to visit.    Allergies  Allergen Reactions  . Bee Venom Anaphylaxis  . Codeine Shortness Of Breath and Rash  . Contrast Media [Iodinated Diagnostic Agents] Shortness Of Breath  . Iodine Other (See Comments)    CARDIAC ARREST  . Latex Anaphylaxis and Rash  . Lidocaine Shortness Of Breath and Swelling    SWELLING OF MOUTH AND THROAT, low BP  . Penicillins Shortness Of Breath, Rash and Other (See Comments)    SEIZURE Did it involve swelling of the face/tongue/throat, SOB, or low BP? no Did it involve sudden or severe rash/hives, skin peeling, or any reaction on the inside of your mouth or nose? yes Did you need to seek medical attention at a hospital or doctor's office? yes When did it last happen?2010 If all above answers are "NO", may proceed with cephalosporin use.   Marland Kitchen Pentazocine Lactate Shortness Of Breath and Rash  . Shellfish Allergy Shortness  Of Breath and Rash  . Aspirin Rash and Other (See Comments)    GI UPSET  . Erythromycin Swelling and Rash    SWELLING OF JOINTS  . Symbicort [Budesonide-Formoterol Fumarate] Other (See Comments)    BURNING OF TONGUE AND LIPS  . Betadine [Povidone Iodine]     Rash. Breathing problems.   . Compazine [Prochlorperazine Maleate] Rash    Rash on face,chest, arms, back  . Sulfonamide Derivatives Rash    ROS Review of Systems    Objective:    Physical Exam Constitutional:      Appearance: She is well-developed.  Cardiovascular:     Rate and Rhythm: Normal rate and regular rhythm.     Heart sounds: Normal heart sounds. No murmur heard.   Pulmonary:     Effort: Pulmonary effort is normal. No respiratory distress.     Breath sounds: Normal breath sounds. No wheezing.  Psychiatric:        Behavior: Behavior normal.        Thought Content: Thought content normal.        Judgment: Judgment normal.     BP 128/75 (BP Location: Right Arm, Patient Position: Sitting, Cuff Size: Large)   Pulse 79   Temp 98.9 F (37.2 C) (Oral)   Resp 16   Wt 287 lb (130.2 kg)   SpO2 98%   BMI 44.95 kg/m  Wt Readings from Last 3 Encounters:  10/26/20 287 lb (130.2 kg)  10/14/20 288 lb 3.2 oz (130.7 kg)  10/01/20 290 lb (131.5 kg)     Health Maintenance Due  Topic Date Due  . Pneumococcal Vaccine 21-53 Years old (1 of 4 - PCV13) Never done  . PNA vac Low Risk Adult (1 of 2 - PCV13) 06/19/2019  . COLONOSCOPY (Pts 45-34yr Insurance coverage will need to be confirmed)  08/28/2019  . COVID-19 Vaccine (4 - Booster for Pfizer series) 06/12/2020    There are no preventive care reminders to display for this patient.  Lab Results  Component Value Date   TSH 0.65 03/01/2020   Lab Results  Component Value Date   WBC 7.4 12/30/2019   HGB 14.5 12/30/2019   HCT 46.1 (H) 12/30/2019   MCV 78.4 (L) 12/30/2019   PLT 294 12/30/2019   Lab Results  Component Value Date   NA 140 06/28/2020   K 4.4  06/28/2020   CHLORIDE 107 01/23/2017  CO2 31 06/28/2020   GLUCOSE 82 06/28/2020   BUN 12 06/28/2020   CREATININE 0.83 06/28/2020   BILITOT 0.9 08/23/2019   ALKPHOS 62 08/23/2019   AST 22 08/23/2019   ALT 41 08/23/2019   PROT 5.9 (L) 08/23/2019   ALBUMIN 3.4 (L) 08/23/2019   CALCIUM 9.7 06/28/2020   ANIONGAP 10 12/30/2019   EGFR 57 (L) 01/23/2017   GFR 73.66 06/28/2020   Lab Results  Component Value Date   CHOL 164 06/28/2020   Lab Results  Component Value Date   HDL 55.10 06/28/2020   Lab Results  Component Value Date   LDLCALC 77 06/28/2020   Lab Results  Component Value Date   TRIG 157.0 (H) 06/28/2020   Lab Results  Component Value Date   CHOLHDL 3 06/28/2020   Lab Results  Component Value Date   HGBA1C 5.8 (H) 03/01/2020      Assessment & Plan:   Problem List Items Addressed This Visit      Unprioritized   Prediabetes    Discussed diet/exercise/weight loss.       Fibromyalgia - Primary    Pt reports resolution of pain since she started gabapentin 269m tid. Continue same.        Other Visit Diagnoses    Hyperglycemia       Relevant Orders   Hemoglobin A1c      No orders of the defined types were placed in this encounter.   Follow-up: Return in about 3 months (around 01/26/2021) for follow up visit.    MNance Pear NP

## 2020-10-26 NOTE — Patient Instructions (Signed)
Please complete lab work prior to leaving.   

## 2020-10-26 NOTE — Assessment & Plan Note (Signed)
Discussed diet/exercise/weight loss.

## 2020-10-26 NOTE — Assessment & Plan Note (Signed)
Pt reports resolution of pain since she started gabapentin 200mg  tid. Continue same.

## 2020-11-03 ENCOUNTER — Other Ambulatory Visit: Payer: Self-pay

## 2020-11-03 ENCOUNTER — Encounter: Payer: Self-pay | Admitting: Family Medicine

## 2020-11-03 ENCOUNTER — Ambulatory Visit (INDEPENDENT_AMBULATORY_CARE_PROVIDER_SITE_OTHER): Payer: 59 | Admitting: Family Medicine

## 2020-11-03 DIAGNOSIS — M5441 Lumbago with sciatica, right side: Secondary | ICD-10-CM | POA: Diagnosis not present

## 2020-11-03 NOTE — Progress Notes (Signed)
Office Visit Note   Patient: Michelle Kennedy           Date of Birth: 30-Jun-1955           MRN: [id number] Visit Date: 11/03/2020 Requested by: Debbrah Alar, NP [address] [place],  [place] [address] PCP: Debbrah Alar, NP  Subjective: Chief Complaint  Patient presents with   Lower Back - Pain    Pain across lower back, more on the left. This flareup has been going on about a week since her ESI, but worsened when she fell while cleaning out her closet 2 days ago - landed on her buttocks. Pain does not radiate down the legs - instead it radiates up the back.    HPI: 65yo F presenting to clinic with recurrent SI Joint pain, L>R. Patient has been undergoing SI Joint injections, which offer good- though temporary- relief. 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 Pain remains localized in her SI Joints, and is not accompanied by any significant radiation. She is otherwise doing well.               Objective: Vital Signs: There were no vitals taken for this visit.  Physical Exam:  General:  Alert and oriented, in no acute distress. Pulm:  Breathing unlabored. Psy:  Normal mood, congruent affect. Skin:  No bruising or rashes.   Normal gait. Tenderness to palpation over bilateral (L>>R) SI Joints.  Lower leg strength and sensation grossly intact bilaterally.   Imaging: No results found.  Assessment & Plan: 65yo F with recurrent sacroiliitis. Given her upcoming shift in insurance plans, she would like to try RFA before she loses her [employer]. She has had several SI Injections in the past, with good (though temporary) relief, and suspect she would be a good candidate for RFA. Will place referral to Dr Ernestina Patches for consideration of Bilateral SI Joint RFA. Patient was happy with plan.      Procedures: No procedures performed        PMFS History: Patient  Active Problem List   Diagnosis Date Noted   Nondisplaced fracture of middle phalanx of left lesser toe(s), initial encounter for closed fracture 09/17/2020   Chronic diastolic CHF (congestive heart failure) ([facility] [facility] [facility]) 08/23/2019   Retinal artery occlusion 08/22/2019   OA (osteoarthritis) of knee 12/03/2017   Class 3 obesity without serious comorbidity with body mass index (BMI) of 40.0 to 44.9 in adult 01/01/2017   Essential hypertension 10/18/2016   Constipation 10/04/2016   Vitamin D deficiency 10/04/2016   Prediabetes 10/04/2016   Obesity, Class III, BMI 40-49.9 (morbid obesity) ([facility]) 09/20/2016   OSA (obstructive sleep apnea) 02/16/2016   Hypertension 10/11/2015   Malignant neoplasm of thyroid gland ([facility]) 10/11/2015   De Quervain's tenosynovitis, bilateral 09/15/2015   Trigger finger, acquired 06/22/2015   Chemotherapy-induced peripheral neuropathy ([facility]) 05/10/2015   Postmenopausal osteoporosis 12/08/2014   Preventative health care 12/01/2014   DVT (deep venous thrombosis) ([facility]) 08/28/2014   Lymphedema of arm 08/28/2014   Mucositis due to chemotherapy 06/02/2014   Menopausal syndrome (hot flashes) 12/31/2013   Breast cancer of upper-outer quadrant of left female breast ([facility]) 12/26/2013   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   Gingivitis 12/05/2013   Seasonal allergies 09/24/2013   Hyperlipidemia 06/02/2011   Migraine 06/02/2011   Tinnitus of both ears 02/14/2011   Fibromyalgia 05/10/2010  Asthma 03/22/2010   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   GERD 12/20/2009   Depression 12/01/2009   Past Medical History:  Diagnosis Date   Abnormally small mouth    Achilles tendon disorder, right    Anemia    Anxiety    Arthritis    hips and knees   Asthma    daily inhaler, prn inhaler and neb.   Asthma due to environmental allergies    Breast cancer ([facility]) 01/2014   left   Chest pain    COPD (chronic obstructive pulmonary disease) ([facility])    Dental bridge  present    upper front and lower right   Dental crowns present    x 3   Depression    Esophageal spasm    reports since her chemo and breast surgery  she developed esophageal spasms and reports this is in the past has casue her 02 to desat in the  70s , denies sycnope in relation to this , does report hx of vertigo as well    Family history of anesthesia complication    twin brother aspirated and died on OR table, per pt.   Fibromyalgia    Gallbladder disease    Gallstones    GERD (gastroesophageal reflux disease)    Glaucoma    H/O blood clots    had blood to  PICC line    History of gastric ulcer    as a teenager   History of seizure age 39   as a reaction to Penicillin - no seizures since   History of stomach ulcers    History of thyroid cancer    s/p thyroidectomy   HTN (hypertension)    Hyperlipidemia    Hypothyroidism    Joint pain    Left knee injury    Liver disorder    seen when she had her hysterectomy , reports she was told it was  " small lesion" ; but asymptomatic    Lymphedema    left arm   Migraines    Multiple food allergies    Obesity    OSA (obstructive sleep apnea) 02/16/2016   CPAP   Palpitations    reports no longer experiences   Personal history of chemotherapy 2015   Personal history of radiation therapy 2015   Left   Prediabetes    Rheumatoid arthritis ([facility])    SOB (shortness of breath)    Swelling of both ankles    Tinnitus    UTI (lower urinary tract infection) 02/17/2014   Vertigo    last episode  was 2 weeks ago    Vitamin D deficiency    Wears contact lenses    left eye only    Wears hearing aid in both ears     Family History  Problem Relation Age of Onset   Alcohol abuse Mother    Arthritis Mother    Hypertension Mother    Bipolar disorder Mother    Breast cancer Mother 17       unconfirmed   Lung cancer Mother 43       smoker   Hyperlipidemia Mother    Stroke Mother    Cancer Mother    Depression Mother    Anxiety  disorder Mother    Alcoholism Mother    Drug abuse Mother    Eating disorder Mother    Obesity Mother    Alcohol abuse Father    Hyperlipidemia Father    Kidney disease Father    Diabetes  Father    Hypertension Father    Cystic kidney disease Father    Thyroid disease Father    Liver disease Father    Alcoholism Father    Lung cancer Father    Arthritis Maternal Grandmother    Diabetes Paternal Grandmother    Thyroid cancer Sister 49       type?; currently 3   Other Sister        ovarian tumor @ 70; TAH/BSO   Breast cancer Sister    Thyroid cancer Brother        dx 4s; currently 9   Breast cancer Maternal Aunt        dx 27s; deceased 57   Thyroid cancer Paternal Aunt        All 3 paternal aunts with thyroid ca in 30s/40s   Lung cancer Paternal Aunt        2 of 3 paternal aunts with lung cancer    Past Surgical History:  Procedure Laterality Date   ABDOMINAL HYSTERECTOMY  2004   complete   ACHILLES TENDON REPAIR Right    APPENDECTOMY  2004   BREAST EXCISIONAL BIOPSY     BREAST LUMPECTOMY Left    2015   BREAST LUMPECTOMY WITH NEEDLE LOCALIZATION AND AXILLARY SENTINEL LYMPH NODE BX Left 02/23/2014   Procedure: BREAST LUMPECTOMY WITH NEEDLE LOCALIZATION AND AXILLARY SENTINEL LYMPH NODE BIOPSY;  Surgeon: Excell Seltzer, MD;  Location: [facility];  Service: General;  Laterality: Left;   BREAST SURGERY  2011   left breast biposy   CHOLECYSTECTOMY  1990   COLONOSCOPY W/ POLYPECTOMY  06/2009   EYE SURGERY Right 2013   exc. warts from underneath eyelid   INCONTINENCE SURGERY  2004   KNEE ARTHROSCOPY Bilateral    x 6 each knee   LIGAMENT REPAIR Right    thumb/wrist   ORIF TOE FRACTURE Right    great toe   PORTACATH PLACEMENT N/A 03/24/2014   Procedure: INSERTION PORT-A-CATH;  Surgeon: Excell Seltzer, MD;  Location: [facility];  Service: General;  Laterality: N/A;; removed    THYROIDECTOMY  2000   TONSILLECTOMY AND ADENOIDECTOMY  2000    TOTAL KNEE ARTHROPLASTY Left 12/03/2017   Procedure: LEFT TOTAL KNEE ARTHROPLASTY;  Surgeon: Gaynelle Arabian, MD;  Location: [facility];  Service: Orthopedics;  Laterality: Left;   TUMOR EXCISION     from thoracic spine   Social History   Occupational History   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

## 2020-11-03 NOTE — Progress Notes (Signed)
I saw and examined the patient with Dr. Elouise Munroe and agree with assessment and plan as outlined.    Recurrent low back pain.  SI injections are helping, but only for a couple months.  Will refer to Dr. Ernestina Patches for consideration of RF.

## 2020-11-07 ENCOUNTER — Encounter: Payer: Self-pay | Admitting: Family

## 2020-11-08 ENCOUNTER — Ambulatory Visit (INDEPENDENT_AMBULATORY_CARE_PROVIDER_SITE_OTHER): Payer: 59 | Admitting: Family Medicine

## 2020-11-08 ENCOUNTER — Ambulatory Visit: Payer: Self-pay

## 2020-11-08 ENCOUNTER — Other Ambulatory Visit: Payer: Self-pay

## 2020-11-08 ENCOUNTER — Encounter: Payer: Self-pay | Admitting: Family Medicine

## 2020-11-08 DIAGNOSIS — M533 Sacrococcygeal disorders, not elsewhere classified: Secondary | ICD-10-CM

## 2020-11-08 DIAGNOSIS — G8929 Other chronic pain: Secondary | ICD-10-CM

## 2020-11-08 MED ORDER — GABAPENTIN 100 MG PO CAPS: 200.0000 mg | ORAL_CAPSULE | Freq: Three times a day (TID) | ORAL | 1 refills | Status: DC

## 2020-11-08 NOTE — Progress Notes (Signed)
Subjective: She is here for left SI joint injection.  Ultimately she would like to consult for RFA, but first she needs to have injections.  She plans to come back in a couple weeks for the other side.  Objective: She is tender to palpation at the inferior aspect of the left SI joint.  Procedure: Ultrasound-guided SI joint injection: After sterile prep with alcohol, injected 6 mg betamethasone into the sacroiliac joint through the ligament without complication.  No anesthetic was used.  Impression:  Bilateral SI joint dysfunction  Plan:  Left SI injection today.  Return in a couple weeks for the right.  Future option of RF.

## 2020-11-11 ENCOUNTER — Other Ambulatory Visit: Payer: Self-pay

## 2020-11-11 MED ORDER — GABAPENTIN 100 MG PO CAPS: 200.0000 mg | ORAL_CAPSULE | Freq: Three times a day (TID) | ORAL | 1 refills | Status: DC

## 2020-11-18 ENCOUNTER — Ambulatory Visit: Payer: 59 | Admitting: Neurology

## 2020-11-28 ENCOUNTER — Other Ambulatory Visit: Payer: Self-pay | Admitting: Family

## 2020-11-29 MED ORDER — FLUTICASONE-SALMETEROL 500-50 MCG/ACT IN AEPB: 1.0000 | INHALATION_SPRAY | Freq: Two times a day (BID) | RESPIRATORY_TRACT | 6 refills | Status: DC

## 2020-11-30 ENCOUNTER — Encounter: Payer: Self-pay | Admitting: Family

## 2020-11-30 DIAGNOSIS — L989 Disorder of the skin and subcutaneous tissue, unspecified: Secondary | ICD-10-CM

## 2020-11-30 DIAGNOSIS — E669 Obesity, unspecified: Secondary | ICD-10-CM

## 2020-11-30 NOTE — Telephone Encounter (Signed)
Michelle Kennedy,   I have pended both referrals from her separate messages requesting a dietician and dermatologist. Could you please sign if appropriately placed?

## 2020-12-01 ENCOUNTER — Encounter: Payer: Self-pay | Admitting: Physical Medicine and Rehabilitation

## 2020-12-01 ENCOUNTER — Other Ambulatory Visit: Payer: Self-pay | Admitting: *Deleted

## 2020-12-01 ENCOUNTER — Other Ambulatory Visit: Payer: Self-pay

## 2020-12-01 ENCOUNTER — Ambulatory Visit (INDEPENDENT_AMBULATORY_CARE_PROVIDER_SITE_OTHER): Payer: 59 | Admitting: Physical Medicine and Rehabilitation

## 2020-12-01 ENCOUNTER — Ambulatory Visit: Payer: Self-pay

## 2020-12-01 VITALS — BP 148/82 | HR 85

## 2020-12-01 DIAGNOSIS — G894 Chronic pain syndrome: Secondary | ICD-10-CM

## 2020-12-01 DIAGNOSIS — M797 Fibromyalgia: Secondary | ICD-10-CM

## 2020-12-01 DIAGNOSIS — M461 Sacroiliitis, not elsewhere classified: Secondary | ICD-10-CM | POA: Diagnosis not present

## 2020-12-01 DIAGNOSIS — Z889 Allergy status to unspecified drugs, medicaments and biological substances status: Secondary | ICD-10-CM

## 2020-12-01 MED ORDER — LISINOPRIL 40 MG PO TABS
40.0000 mg | ORAL_TABLET | Freq: Every day | ORAL | 1 refills | Status: DC
Start: 2020-12-01 — End: 2021-02-20

## 2020-12-01 MED ORDER — BETAMETHASONE SOD PHOS & ACET 6 (3-3) MG/ML IJ SUSP
12.0000 mg | Freq: Once | INTRAMUSCULAR | Status: AC
  Administered 2020-12-01: 12 mg

## 2020-12-01 NOTE — Progress Notes (Signed)
Pt state lower back pain that travels down her left leg to her knee. Pt state bending and keeping up things and moving in bed makes the pin worse. Pt state she takes pain meds and uses heating pads to help ease her pain.  Numeric Pain Rating Scale and Functional Assessment Average Pain 6   In the last MONTH (on 0-10 scale) has pain interfered with the following?  1. General activity like being  able to carry out your everyday physical activities such as walking, climbing stairs, carrying groceries, or moving a chair?  Rating(8)   +Driver, +BT, +Dye Allergies.

## 2020-12-01 NOTE — Telephone Encounter (Signed)
Patient advised that pharmacy faxed over request for cost effective alternative for lisinopril. Instead of 20mg  2 tabs daily, we sent in 40mg  1 tab daily.

## 2020-12-01 NOTE — Progress Notes (Signed)
Michelle Kennedy - 65 y.o. female MRN [id number]  Date of birth: 1955/11/27  Office Visit Note: Visit Date: 12/01/2020 PCP: Debbrah Alar, NP Referred by: Debbrah Alar, NP  Subjective: Chief Complaint  Patient presents with   Lower Back - Pain   HPI: Michelle Kennedy is a 65 y.o. female who comes in today at the request of Dr. Eunice Blase for evaluation and management of bilateral low back and buttock pain with some referral in the posterior lateral leg.  She denies any frank paresthesias or numbness.  Her pain is a 6 out of 10 on average but can be much worse.  She reports that it really affects her daily living.  It radiates to the knee and not past the knee.  Her pain at this point is left more than right but is bilateral.  She has undergone multiple rounds of physical therapy and chiropractic care.  Dr. Junius Roads has managed her with good conservative care.  He ended up completing diagnostic sacroiliac joint injections using ultrasound guidance.  These were done bilaterally.  They were done as a double block paradigm.  Last injection was on the right.  She does report more than 50% relief temporarily.  She reports that the only thing that really has helped.  She has had no prior lumbar surgery.  Her case is complicated by obesity as well as history of rheumatoid arthritis and multiple drug intolerances and allergies with fibromyalgia.  Most significantly she is allergic to iodine and contrast agents as well as lidocaine.  She does report anaphylactic reaction with lidocaine.  She is unsure about bupivacaine.  She is on chronic anticoagulant as well.  She has not had any trauma since she was last seen him no focal weakness.  Review of Systems  Musculoskeletal:  Positive for back pain and joint pain.  All other systems reviewed and are negative. Otherwise per HPI.  Assessment & Plan: Visit Diagnoses:    ICD-10-CM   1. Sacroiliitis, not elsewhere classified ([place])  M46.1 XR C-ARM  NO REPORT    Radiofrequency,Lumbar    betamethasone acetate-betamethasone sodium phosphate (CELESTONE) injection 12 mg    2. Chronic pain syndrome  G89.4     3. Fibromyalgia  M79.7     4. Drug allergy, multiple  Z88.9        Plan: Findings:  Chronic, severe, worsening and recalcitrant low back and bilateral buttock pain left more than right but pretty equal.  Only real relief has been temporary relief with ultrasound-guided sacroiliac joint injections by Dr. Junius Roads.  Dr.Hilts's follow her quite extensively.  She has a significant history of fibromyalgia with clear underlying central sensitization syndrome.  Her case is very complicated by obesity in terms of technical placement of radiofrequency cannulas as well as multiple drug allergies and intolerances.  Most specifically she has anaphylactic reaction to lidocaine.  Luckily in the ablation we do not need to use any contrast as she has anaphylactic reaction to that as well.  Fortunately she does well with cortisone and steroid.  She has not had any bupivacaine which at least by pharmacological studies seems to be less cross-reactive with lidocaine.  We discussed at great length today about doing the procedure without anesthetic.  She does want to proceed as she feels that she is desperate to get any pain relief that she can.  We decided to place a little bit of corticosteroid prior to the ablation procedure to see if that would help any with  pain relief.  At least in the sacroiliac joint area I have not had too many people suffer any severe nerve type pain from the ablation process.  In the upper lumbar region it is very common to have significant pain with ablating the medial branch nerve and it will require significant anesthesia.  Once the procedure started if she is having severe pain we will stop the procedure.  We are going to complete the left side today Duda symptoms.  We will have her back for the right side if she tolerates this well.   Meds  & Orders:  Meds ordered this encounter  Medications   betamethasone acetate-betamethasone sodium phosphate (CELESTONE) injection 12 mg    Orders Placed This Encounter  Procedures   Radiofrequency,Lumbar   XR C-ARM NO REPORT    Follow-up: No follow-ups on file.   Procedures: No procedures performed  Sacroiliac Joint Peripheral Nerve Denervation - Posterior Approach with Fluoroscopic Guidance   Patient: Michelle Kennedy      Date of Birth: 1956-02-09 MRN: [id number] PCP: Debbrah Alar, NP      Visit Date: 12/01/2020   Universal Protocol:    Date/Time: 07/18/226:33 AM  Consent Given By: the patient  Position: PRONE  Additional Comments: Vital signs were monitored before and after the procedure. Patient was prepped and draped in the usual sterile fashion. The correct patient, procedure, and site was verified.   Injection Procedure Details:  Procedure Site One Meds Administered:  Meds ordered this encounter  Medications   betamethasone acetate-betamethasone sodium phosphate (CELESTONE) injection 12 mg     Laterality: Left  Location/Site: Lateral branches L5, S1 and S2 L5-S1 S1-2 S2-3  Needle size: 18 G  Needle type: Cosman/[employer] radiofrequency cannula  Findings:    -Comments: Good biplanar imaging showing cannula localization with good motor stimulation.  Procedure Details: For the L5, S1 and S2 dorsal rami the fluoroscope was positioned to square off the sacrum or sacral ala to achieve a true AP midline view.  For the L5 dorsal rami the beam was then obliqued 15 to 20 degrees and caudally tilted 15 to 20 degrees to line up a trajectory along the target nerve. The skin over the target of the junction of superior articulating process and sacral ala was i sprayed with Melvyn Neth solution.  The patient reports severe anaphylactic reaction to lidocaine.  No topical anesthetic injected.  The 18 gauge 28mm active tip outer cannula was advanced in  trajectory view to the target. The outer cannula placement was fine-tuned and the position was then confirmed with bi-planar imaging.  For the S1 and S2 dorsal rami, no obliquity was utilized but the same caudal tilt was used.  Three targets corresponding to the 12 o'clock and 6 o'clock and the lateral mid-line positions around the foramen were  visualized. The 18 gauge 12mm active tip outer cannula was advanced in trajectory view to each target. The outer cannula placement was fine-tuned and the position was then confirmed with bi-planar imaging  This procedure was repeated for each target nerve position.  Then, for all levels, the outer cannula placement was fine-tuned and the position was then confirmed with bi-planar imaging.    Test stimulation was done at motor levels to ensure there was no radicular stimulation. The target tissues were then infiltrated with 0.5 mL of Celestone, lidocaine not utilized do to allergy. Subsequently, a percutaneous neurotomy was carried out for 90 seconds at 80 degrees Celsius.  One 60 second neurotomy was performed  for each target around the S1 and S2 foramen. After the completion of the respective lesions, 1 ml of injectate was delivered.  Appropriate radiographs were obtained to verify the probe placement during the neurotomy.  Additional Comments:  The patient tolerated the procedure well.  Left leg the patient did not have any significant nerve pain during the ablation process. Dressing: Band-Aid    Post-procedure details: Patient was observed during the procedure. Post-procedure instructions were reviewed. Follow-Up Instructions: Given as patient handout Patient left the clinic in stable condition.      Clinical History: Imaging: US Guided Needle Placement - No Linked Charges   Result Date: 06/15/2020 Ultrasound guided injection is preferred based studies that show increased duration, increased effect, greater accuracy, decreased procedural pain,  increased response rate, and decreased cost with ultrasound guided versus blind injection.   Verbal informed consent obtained.  Time-out conducted.  Noted no overlying erythema, induration, or other signs of local infection. Ultrasound-guided right SI injection: After sterile prep with alcohol, injected 6 mg betamethasone using a 22-gauge spinal needle, passing the needle through the sacroiliac ligament into the region of the SI joint. ----------------- Imaging: US Guided Needle Placement   Result Date: 12/17/2019 Ultrasound-guided right SI injection: After sterile prep with alcohol, injected 40 mg methylprednisolone using a 22-gauge spinal needle, passing the needle through the sacroiliac ligament into the region of the SI joint.   She reports that she has never smoked. She has never used smokeless tobacco.  Recent Labs    03/01/20 1239 10/26/20 1002  HGBA1C 5.8* 6.2    Objective:  VS:  HT:    WT:   BMI:     BP:(!) 148/82  HR:85bpm  TEMP: ( )  RESP:  Physical Exam Vitals and nursing note reviewed.  Constitutional:      General: She is not in acute distress.    Appearance: Normal appearance. She is obese. She is not ill-appearing.  HENT:     Head: Normocephalic and atraumatic.     Right Ear: External ear normal.     Left Ear: External ear normal.  Eyes:     Extraocular Movements: Extraocular movements intact.  Cardiovascular:     Rate and Rhythm: Normal rate.     Pulses: Normal pulses.  Pulmonary:     Effort: Pulmonary effort is normal. No respiratory distress.  Abdominal:     General: There is no distension.     Palpations: Abdomen is soft.  Musculoskeletal:        General: Tenderness present.     Cervical back: Neck supple.     Right lower leg: No edema.     Left lower leg: No edema.     Comments: Patient has good distal strength with pain over the greater trochanters.  No clonus or focal weakness.  Positive Fortin finger sign on the left and right as well as positive  bilateral Patrick's.  Skin:    Findings: No erythema, lesion or rash.  Neurological:     General: No focal deficit present.     Mental Status: She is alert and oriented to person, place, and time.     Sensory: No sensory deficit.     Motor: No weakness or abnormal muscle tone.     Coordination: Coordination normal.  Psychiatric:        Mood and Affect: Mood normal.        Behavior: Behavior normal.    Ortho Exam  Imaging: No results found.  Past Medical/Family/Surgical/Social  History: Medications & Allergies reviewed per EMR, new medications updated. Patient Active Problem List   Diagnosis Date Noted   Nondisplaced fracture of middle phalanx of left lesser toe(s), initial encounter for closed fracture 09/17/2020   Chronic diastolic CHF (congestive heart failure) ([place]) 08/23/2019   Retinal artery occlusion 08/22/2019   OA (osteoarthritis) of knee 12/03/2017   Obesity 01/01/2017   Essential hypertension 10/18/2016   Constipation 10/04/2016   Vitamin D deficiency 10/04/2016   Prediabetes 10/04/2016   Obesity, Class III, BMI 40-49.9 (morbid obesity) ([place]) 09/20/2016   OSA (obstructive sleep apnea) 02/16/2016   Hypertension 10/11/2015   Malignant neoplasm of thyroid gland ([place]) 10/11/2015   De Quervain's tenosynovitis, bilateral 09/15/2015   Trigger finger, acquired 06/22/2015   Chemotherapy-induced peripheral neuropathy ([place]) 05/10/2015   Postmenopausal osteoporosis 12/08/2014   Preventative health care 12/01/2014   DVT (deep venous thrombosis) ([place]) 08/28/2014   Lymphedema of arm 08/28/2014   Mucositis due to chemotherapy 06/02/2014   Menopausal syndrome (hot flashes) 12/31/2013   Breast cancer of upper-outer quadrant of left female breast ([place]) 12/26/2013   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   Asthma due to environmental allergies    Breast cancer ([place]) 01/2014   left   Chest pain    COPD (chronic obstructive pulmonary disease) ([facility])    Dental bridge present    upper front and lower right   Dental crowns present    x 3   Depression    Esophageal spasm    reports since her chemo and breast surgery  she developed esophageal spasms and reports this is in the past has casue her 02 to desat in the  70s , denies sycnope in relation to this , does report hx of vertigo as well    Family history of anesthesia complication    twin brother aspirated and died on OR table, per pt.   Fibromyalgia    Gallbladder disease    Gallstones    GERD (gastroesophageal reflux disease)    Glaucoma    H/O blood clots    had blood to  PICC line    History of gastric ulcer    as a teenager   History of seizure age 32   as a reaction to Penicillin - no seizures since   History of stomach ulcers    History of thyroid cancer    s/p thyroidectomy   HTN (hypertension)    Hyperlipidemia    Hypothyroidism    Joint pain    Left knee injury    Liver disorder    seen when she had her hysterectomy , reports she was told it was  " small lesion" ; but asymptomatic    Lymphedema    left arm   Migraines    Multiple food allergies    Obesity    OSA (obstructive sleep apnea) 02/16/2016   CPAP   Palpitations    reports  no longer experiences   Personal history of chemotherapy 2015   Personal history of radiation therapy 2015   Left   Prediabetes    Rheumatoid arthritis ([place])    SOB (shortness of breath)    Swelling of both ankles    Tinnitus    UTI (lower urinary tract  infection) 02/17/2014   Vertigo    last episode  was 2 weeks ago    Vitamin D deficiency    Wears contact lenses    left eye only    Wears hearing aid in both ears    Family History  Problem Relation Age of Onset   Alcohol abuse Mother    Arthritis Mother    Hypertension Mother    Bipolar disorder Mother    Breast cancer Mother 53       unconfirmed   Lung cancer Mother 73       smoker   Hyperlipidemia Mother    Stroke Mother    Cancer Mother    Depression Mother    Anxiety disorder Mother    Alcoholism Mother    Drug abuse Mother    Eating disorder Mother    Obesity Mother    Alcohol abuse Father    Hyperlipidemia Father    Kidney disease Father    Diabetes Father    Hypertension Father    Cystic kidney disease Father    Thyroid disease Father    Liver disease Father    Alcoholism Father    Lung cancer Father    Arthritis Maternal Grandmother    Diabetes Paternal Grandmother    Thyroid cancer Sister 78       type?; currently 61   Other Sister        ovarian tumor @ 16; TAH/BSO   Breast cancer Sister    Thyroid cancer Brother        dx 41s; currently 15   Breast cancer Maternal Aunt        dx 40s; deceased 91   Thyroid cancer Paternal Aunt        All 3 paternal aunts with thyroid ca in 30s/40s   Lung cancer Paternal Aunt        2 of 3 paternal aunts with lung cancer   Past Surgical History:  Procedure Laterality Date   ABDOMINAL HYSTERECTOMY  2004   complete   ACHILLES TENDON REPAIR Right    APPENDECTOMY  2004   BREAST EXCISIONAL BIOPSY     BREAST LUMPECTOMY Left    2015   BREAST LUMPECTOMY WITH NEEDLE LOCALIZATION AND AXILLARY SENTINEL LYMPH NODE BX Left 02/23/2014   Procedure: BREAST LUMPECTOMY WITH NEEDLE LOCALIZATION AND AXILLARY SENTINEL LYMPH NODE BIOPSY;  Surgeon: Excell Seltzer, MD;  Location: [facility];  Service: General;  Laterality: Left;   BREAST SURGERY  2011   left breast biposy   CHOLECYSTECTOMY  1990   COLONOSCOPY W/  POLYPECTOMY  06/2009   EYE SURGERY Right 2013   exc. warts from underneath eyelid   INCONTINENCE SURGERY  2004   KNEE ARTHROSCOPY Bilateral    x 6 each knee   LIGAMENT REPAIR Right    thumb/wrist   ORIF TOE FRACTURE Right    great toe   PORTACATH PLACEMENT N/A 03/24/2014   Procedure: INSERTION PORT-A-CATH;  Surgeon: Excell Seltzer, MD;  Location: [facility];  Service: General;  Laterality: N/A;; removed    THYROIDECTOMY  2000   TONSILLECTOMY AND ADENOIDECTOMY  2000   TOTAL KNEE  ARTHROPLASTY Left 12/03/2017   Procedure: LEFT TOTAL KNEE ARTHROPLASTY;  Surgeon: Gaynelle Arabian, MD;  Location: [facility];  Service: Orthopedics;  Laterality: Left;   TUMOR EXCISION     from thoracic spine   Social History   Occupational History   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

## 2020-12-01 NOTE — Patient Instructions (Signed)

## 2020-12-06 ENCOUNTER — Encounter: Payer: Self-pay | Admitting: Physical Medicine and Rehabilitation

## 2020-12-06 NOTE — Procedures (Signed)
Sacroiliac Joint Peripheral Nerve Denervation - Posterior Approach with Fluoroscopic Guidance   Patient: Michelle Kennedy      Date of Birth: 10/13/1955 MRN: [id number] PCP: Debbrah Alar, NP      Visit Date: 12/01/2020   Universal Protocol:    Date/Time: 07/18/226:33 AM  Consent Given By: the patient  Position: PRONE  Additional Comments: Vital signs were monitored before and after the procedure. Patient was prepped and draped in the usual sterile fashion. The correct patient, procedure, and site was verified.   Injection Procedure Details:  Procedure Site One Meds Administered:  Meds ordered this encounter  Medications   betamethasone acetate-betamethasone sodium phosphate (CELESTONE) injection 12 mg     Laterality: Left  Location/Site: Lateral branches L5, S1 and S2 L5-S1 S1-2 S2-3  Needle size: 18 G  Needle type: Cosman/[employer] radiofrequency cannula  Findings:    -Comments: Good biplanar imaging showing cannula localization with good motor stimulation.  Procedure Details: For the L5, S1 and S2 dorsal rami the fluoroscope was positioned to square off the sacrum or sacral ala to achieve a true AP midline view.  For the L5 dorsal rami the beam was then obliqued 15 to 20 degrees and caudally tilted 15 to 20 degrees to line up a trajectory along the target nerve. The skin over the target of the junction of superior articulating process and sacral ala was i sprayed with Melvyn Neth solution.  The patient reports severe anaphylactic reaction to lidocaine.  No topical anesthetic injected.  The 18 gauge 72mm active tip outer cannula was advanced in trajectory view to the target. The outer cannula placement was fine-tuned and the position was then confirmed with bi-planar imaging.  For the S1 and S2 dorsal rami, no obliquity was utilized but the same caudal tilt was used.  Three targets corresponding to the 12 o'clock and 6 o'clock and the lateral mid-line  positions around the foramen were  visualized. The 18 gauge 5mm active tip outer cannula was advanced in trajectory view to each target. The outer cannula placement was fine-tuned and the position was then confirmed with bi-planar imaging  This procedure was repeated for each target nerve position.  Then, for all levels, the outer cannula placement was fine-tuned and the position was then confirmed with bi-planar imaging.    Test stimulation was done at motor levels to ensure there was no radicular stimulation. The target tissues were then infiltrated with 0.5 mL of Celestone, lidocaine not utilized do to allergy. Subsequently, a percutaneous neurotomy was carried out for 90 seconds at 80 degrees Celsius.  One 60 second neurotomy was performed for each target around the S1 and S2 foramen. After the completion of the respective lesions, 1 ml of injectate was delivered.  Appropriate radiographs were obtained to verify the probe placement during the neurotomy.  Additional Comments:  The patient tolerated the procedure well.  Left leg the patient did not have any significant nerve pain during the ablation process. Dressing: Band-Aid    Post-procedure details: Patient was observed during the procedure. Post-procedure instructions were reviewed. Follow-Up Instructions: Given as patient handout Patient left the clinic in stable condition.

## 2020-12-28 ENCOUNTER — Ambulatory Visit: Payer: Self-pay

## 2020-12-28 ENCOUNTER — Other Ambulatory Visit: Payer: Self-pay

## 2020-12-28 ENCOUNTER — Encounter: Payer: Self-pay | Admitting: Physical Medicine and Rehabilitation

## 2020-12-28 ENCOUNTER — Ambulatory Visit (INDEPENDENT_AMBULATORY_CARE_PROVIDER_SITE_OTHER): Payer: 59 | Admitting: Physical Medicine and Rehabilitation

## 2020-12-28 VITALS — BP 126/80 | HR 82

## 2020-12-28 DIAGNOSIS — M461 Sacroiliitis, not elsewhere classified: Secondary | ICD-10-CM

## 2020-12-28 DIAGNOSIS — G894 Chronic pain syndrome: Secondary | ICD-10-CM

## 2020-12-28 MED ORDER — BETAMETHASONE SOD PHOS & ACET 6 (3-3) MG/ML IJ SUSP
12.0000 mg | Freq: Once | INTRAMUSCULAR | Status: AC
  Administered 2020-12-28: 12 mg

## 2020-12-28 NOTE — Patient Instructions (Signed)

## 2020-12-28 NOTE — Progress Notes (Signed)
Pt state lower back pain mostly her right side. Pt state any movement makes the pain worse. Pt state she takes pain meds to help ease her pain. Pt has hx of inj on 12/01/20 pt state it helped.   Numeric Pain Rating Scale and Functional Assessment Average Pain 6   In the last MONTH (on 0-10 scale) has pain interfered with the following?  1. General activity like being  able to carry out your everyday physical activities such as walking, climbing stairs, carrying groceries, or moving a chair?  Rating(10)   +Driver, -BT, +Dye Allergies.

## 2021-01-11 NOTE — Progress Notes (Signed)
Michelle Kennedy - 65 y.o. female MRN [id number]  Date of birth: 10-Feb-1956  Office Visit Note: Visit Date: 12/28/2020 PCP: Debbrah Alar, NP Referred by: Debbrah Alar, NP  Subjective: Chief Complaint  Patient presents with   Lower Back - Pain   HPI:  Michelle Kennedy is a 65 y.o. female who comes in today for planned radiofrequency ablation of the Right L5-S1, S1-2, and S2-3 lateral branches that innervate the right SI joint. This would be ablation of the corresponding medial branches and/or dorsal rami.  Patient has had double diagnostic blocks with more than 50% relief.  These are documented in Dr. Karma Greaser chart. They have had chronic back pain for quite some time, more than 3 months, which has been an ongoing situation with recalcitrant axial back pain.  They have no radicular pain.  Their axial pain is worse with standing and ambulating and on exam today with facet loading.  They have had physical therapy as well as home exercise program.  T Accordingly they meet all the criteria and qualification for for radiofrequency ablation and we are going to complete this today hopefully for more longer term relief as part of comprehensive management program.  Her case is complicated by significant multi drug allergy including lidocaine and contrast.  Contrast not used in this procedure but patient has had prior ablation on the left and did well without the local anesthetic.  ROS Otherwise per HPI.  Assessment & Plan: Visit Diagnoses:    ICD-10-CM   1. Sacroiliitis, not elsewhere classified ([place])  M46.1 XR C-ARM NO REPORT    Radiofrequency,Lumbar    betamethasone acetate-betamethasone sodium phosphate (CELESTONE) injection 12 mg    2. Chronic pain syndrome  G89.4 XR C-ARM NO REPORT    Radiofrequency,Lumbar    betamethasone acetate-betamethasone sodium phosphate (CELESTONE) injection 12 mg      Plan: No additional findings.   Meds & Orders:  Meds ordered this encounter   Medications   betamethasone acetate-betamethasone sodium phosphate (CELESTONE) injection 12 mg    Orders Placed This Encounter  Procedures   Radiofrequency,Lumbar   XR C-ARM NO REPORT    Follow-up: Return if symptoms worsen or fail to improve.   Procedures: No procedures performed  Sacroiliac Joint Peripheral Nerve Denervation - Posterior Approach with Fluoroscopic Guidance   Patient: Michelle Kennedy      Date of Birth: September 03, 1955 MRN: [id number] PCP: Debbrah Alar, NP      Visit Date: 12/28/2020   Universal Protocol:    Date/Time: 08/23/229:02 AM  Consent Given By: the patient  Position: PRONE  Additional Comments: Vital signs were monitored before and after the procedure. Patient was prepped and draped in the usual sterile fashion. The correct patient, procedure, and site was verified.   Injection Procedure Details:  Procedure Site One Meds Administered:  Meds ordered this encounter  Medications   betamethasone acetate-betamethasone sodium phosphate (CELESTONE) injection 12 mg     Laterality: Right  Location/Site: Lateral branches L5, S1 and S2 L5-S1 S1-2 S2-3  Needle size: 18 G  Needle type: Cosman/[employer] radiofrequency cannula  Findings:    -Comments: Good biplanar imaging showing cannula localization with good motor stimulation.  Procedure Details: For the L5, S1 and S2 dorsal rami the fluoroscope was positioned to square off the sacrum or sacral ala to achieve a true AP midline view.  For the L5 dorsal rami the beam was then obliqued 15 to 20 degrees and caudally tilted 15 to 20 degrees to line  up a trajectory along the target nerve. The skin over the target of the junction of superior articulating process and sacral ala was anesthetized using Gebauer solution.  The 18 gauge 25m active tip outer cannula was advanced in trajectory view to the target. The outer cannula placement was fine-tuned and the position was then confirmed with  bi-planar imaging.  For the S1 and S2 dorsal rami, no obliquity was utilized but the same caudal tilt was used.  Three targets corresponding to the 12 o'clock and 6 o'clock and the lateral mid-line positions around the foramen were  visualized. The 18 gauge 153mactive tip outer cannula was advanced in trajectory view to each target. The outer cannula placement was fine-tuned and the position was then confirmed with bi-planar imaging  This procedure was repeated for each target nerve position.  Then, for all levels, the outer cannula placement was fine-tuned and the position was then confirmed with bi-planar imaging.    Test stimulation was done both at sensory and motor levels to ensure there was no radicular stimulation. The target tissues were then infiltrated with 1 ml of betamethasone. Subsequently, a percutaneous neurotomy was carried out for90 seconds at 80 degrees Celsius.  One 60 second neurotomy was performed for each target around the S1 and S2 foramen. After the completion of the respective lesions, 1 ml of injectate was delivered.  Appropriate radiographs were obtained to verify the probe placement during the neurotomy.  Additional Comments:  The patient tolerated the procedure well Dressing: Band-Aid    Post-procedure details: Patient was observed during the procedure. Post-procedure instructions were reviewed. Follow-Up Instructions: Given as patient handout Patient left the clinic in stable condition.      Clinical History: Imaging: US[place]ided Needle Placement - No Linked Charges   Result Date: 06/15/2020 Ultrasound guided injection is preferred based studies that show increased duration, increased effect, greater accuracy, decreased procedural pain, increased response rate, and decreased cost with ultrasound guided versus blind injection.   Verbal informed consent obtained.  Time-out conducted.  Noted no overlying erythema, induration, or other signs of local infection.  Ultrasound-guided right SI injection: After sterile prep with alcohol, injected 6 mg betamethasone using a 22-gauge spinal needle, passing the needle through the sacroiliac ligament into the region of the SI joint. ----------------- Imaging: US[place]oreauided Needle Placement   Result Date: 12/17/2019 Ultrasound-guided right SI injection: After sterile prep with alcohol, injected 40 mg methylprednisolone using a 22-gauge spinal needle, passing the needle through the sacroiliac ligament into the region of the SI joint.     Objective:  VS:  HT:    WT:   BMI:     BP:126/80  HR:82bpm  TEMP: ( )  RESP:  Physical Exam Vitals and nursing note reviewed.  Constitutional:      General: She is not in acute distress.    Appearance: Normal appearance. She is obese. She is not ill-appearing.  HENT:     Head: Normocephalic and atraumatic.     Right Ear: External ear normal.     Left Ear: External ear normal.  Eyes:     Extraocular Movements: Extraocular movements intact.  Cardiovascular:     Rate and Rhythm: Normal rate.     Pulses: Normal pulses.  Pulmonary:     Effort: Pulmonary effort is normal. No respiratory distress.  Abdominal:     General: There is no distension.     Palpations: Abdomen is soft.  Musculoskeletal:        General:  Tenderness present.     Cervical back: Neck supple.     Right lower leg: No edema.     Left lower leg: No edema.     Comments: Patient has good distal strength with pain over the greater trochanters.  No clonus or focal weakness.  Positive Fortin finger sign positive Patrick's on the right.  Skin:    Findings: No erythema, lesion or rash.  Neurological:     General: No focal deficit present.     Mental Status: She is alert and oriented to person, place, and time.     Sensory: No sensory deficit.     Motor: No weakness or abnormal muscle tone.     Coordination: Coordination normal.  Psychiatric:        Mood and Affect: Mood normal.        Behavior: Behavior  normal.     Imaging: No results found.

## 2021-01-11 NOTE — Procedures (Signed)
Sacroiliac Joint Peripheral Nerve Denervation - Posterior Approach with Fluoroscopic Guidance   Patient: Michelle Kennedy      Date of Birth: [birth year] MRN: [id number] PCP: Debbrah Alar, NP      Visit Date: 12/28/2020   Universal Protocol:    Date/Time: 08/23/229:02 AM  Consent Given By: the patient  Position: PRONE  Additional Comments: Vital signs were monitored before and after the procedure. Patient was prepped and draped in the usual sterile fashion. The correct patient, procedure, and site was verified.   Injection Procedure Details:  Procedure Site One Meds Administered:  Meds ordered this encounter  Medications   betamethasone acetate-betamethasone sodium phosphate (CELESTONE) injection 12 mg     Laterality: Right  Location/Site: Lateral branches L5, S1 and S2 L5-S1 S1-2 S2-3  Needle size: 18 G  Needle type: Cosman/[employer] radiofrequency cannula  Findings:    -Comments: Good biplanar imaging showing cannula localization with good motor stimulation.  Procedure Details: For the L5, S1 and S2 dorsal rami the fluoroscope was positioned to square off the sacrum or sacral ala to achieve a true AP midline view.  For the L5 dorsal rami the beam was then obliqued 15 to 20 degrees and caudally tilted 15 to 20 degrees to line up a trajectory along the target nerve. The skin over the target of the junction of superior articulating process and sacral ala was anesthetized using Gebauer solution.  The 18 gauge 71m active tip outer cannula was advanced in trajectory view to the target. The outer cannula placement was fine-tuned and the position was then confirmed with bi-planar imaging.  For the S1 and S2 dorsal rami, no obliquity was utilized but the same caudal tilt was used.  Three targets corresponding to the 12 o'clock and 6 o'clock and the lateral mid-line positions around the foramen were  visualized. The 18 gauge 144mactive tip outer cannula was  advanced in trajectory view to each target. The outer cannula placement was fine-tuned and the position was then confirmed with bi-planar imaging  This procedure was repeated for each target nerve position.  Then, for all levels, the outer cannula placement was fine-tuned and the position was then confirmed with bi-planar imaging.    Test stimulation was done both at sensory and motor levels to ensure there was no radicular stimulation. The target tissues were then infiltrated with 1 ml of betamethasone. Subsequently, a percutaneous neurotomy was carried out for90 seconds at 80 degrees Celsius.  One 60 second neurotomy was performed for each target around the S1 and S2 foramen. After the completion of the respective lesions, 1 ml of injectate was delivered.  Appropriate radiographs were obtained to verify the probe placement during the neurotomy.  Additional Comments:  The patient tolerated the procedure well Dressing: Band-Aid    Post-procedure details: Patient was observed during the procedure. Post-procedure instructions were reviewed. Follow-Up Instructions: Given as patient handout Patient left the clinic in stable condition.

## 2021-01-12 ENCOUNTER — Encounter: Payer: Self-pay | Admitting: Registered"

## 2021-01-12 ENCOUNTER — Encounter: Payer: 59 | Attending: Family | Admitting: Registered"

## 2021-01-12 ENCOUNTER — Other Ambulatory Visit: Payer: Self-pay

## 2021-01-12 DIAGNOSIS — Z713 Dietary counseling and surveillance: Secondary | ICD-10-CM | POA: Diagnosis present

## 2021-01-12 NOTE — Patient Instructions (Addendum)
-   Aim to have balance snacks with carbohdyrates + protein Ex. Fruit and nuts  - Aim to balance meals with 1/2 plate of non-starchy vegetables + 1/4 plate of carbohydrates + 1/4 plate of lean protein  - Continue to keep up the great work with physical activity and drinking water with meals.

## 2021-01-12 NOTE — Progress Notes (Signed)
Medical Nutrition Therapy  Appointment Start time:  8:55  Appointment End time:  9:56  Primary concerns today: education on how to put meals together to lose weight; prediabetes education  Referral diagnosis: obesity Preferred learning style: no preference indicated Learning readiness: ready   NUTRITION ASSESSMENT   Pt arrives with husband. Reports recent diagnosis of prediabetes. States she has been unsuccessful at losing weight. States she has been losing small amounts recently. Reports husband does most of the cooking. States she prefers to eat mostly unprocessed food but husband cooks a lot of pasta and items from the box.   States she has no hormones in her system which has slowed down her metabolism. States she feels like she is going backwards more than forward. States she noticed changes in weight after breast cancer treatments; gained about 40 lbs.  States she walks 30 min, 5-6 days/week. Reports she was walking in the pool but since recent back procedure she has not been going to the pool for concerns of infecting wounds. States her wounds are still open; husband says they're closed. Reports she has increased walking time from 15 minute to 30 minute sessions.   Stats she tries to eat all of her food before 8:30 pm. Reports they eat out about once/week. Sometimes they will go restaurants or grab fast food. Reports a typical meal may be [employer] for burger and sweet potato fries or Chipotle - 1/2 chicken burrito or [employer] - white meat chicken + cole slaw. States she wakes up in the middle of the night with extreme hunger; happens about 3 times a week.   Reports family history of diabetes. States she has history of thyroid cancer, uterine cancer, breast cancer, and has had a knee replacement. Reports she has upcoming appointment with endocrinologist 02/2021.   Clinical Medical Hx: prediabetes, thyroid cancer, uterine cancer, breast cancer Medications: See list Labs: elevated  A1c 6.2 Notable Signs/Symptoms: none reported  Lifestyle & Dietary Hx  Estimated daily fluid intake: 64+ oz Supplements: See list Sleep: 7-8 hrs; not continuous, Uses CPAP at night Stress / self-care: knitting, sewing, talking to friends Current average weekly physical activity: walking 30 min, 5-6 days/week  24-Hr Dietary Recall First Meal (9 am): Slim Fast drink (Cappuccino delight)  + coffee Snack:  Second Meal (123456 pm): 2 slices of bread + ham + cheese + mustard + hot and sour soup  Snack:  Third Meal (6:30 pm): [place] tettrazzini + ceaser salad + ranch dressing + toast Snack (8:30 pm): popcorn Beverages: water (61 oz), coffee with low-fat half and half)    NUTRITION DIAGNOSIS  NB-1.1 Food and nutrition-related knowledge deficit As related to prediabetes.  As evidenced by lack of previous nutrition-related education.   NUTRITION INTERVENTION  Nutrition education (E-1) on the following topics: Pt was educated and counseled on metabolism, importance of not skipping meals, effects of dieting/restriction, how carbohydrates work in the body, prediabetes/diabetes risk factors, A1c ranges for prediabetes/diabetes, role of fiber in eating regimen, and importance of physical activity. Discussed meal/snack planning and how to balance meals/snacks using MyPlate for prediabetes. Pt agreed with goals listed.  Handouts Provided Include  My Plate for Prediabetes  Learning Style & Readiness for Change Teaching method utilized: Visual & Auditory  Demonstrated degree of understanding via: Teach Back  Barriers to learning/adherence to lifestyle change: none identfied  Goals Established by Pt Aim to have balance snacks with carbohdyrates + protein Ex. Fruit and nuts Aim to balance meals with 1/2 plate of non-starchy  vegetables + 1/4 plate of carbohydrates + 1/4 plate of lean protein Continue to keep up the great work with physical activity and drinking water with meals.    MONITORING &  EVALUATION Dietary intake, weekly physical activity.  Next Steps  Patient is to follow-up prn.

## 2021-01-13 NOTE — Progress Notes (Addendum)
Patient Care Team: Michelle Alar, NP as PCP - General (Internal Medicine) Excell Seltzer, MD (Inactive) as Consulting Physician (General Surgery) Nicholas Lose, MD as Consulting Physician (Hematology and Oncology) [name], MD as Attending Physician (Radiation Oncology) Katheran James., MD (Endocrinology) Maxwell Marion, RN as Registered Nurse (Medical Oncology) Renelda Loma, OD as Consulting Physician (Optometry)  DIAGNOSIS:    ICD-10-CM   1. Malignant neoplasm of upper-outer quadrant of left breast in female, estrogen receptor positive ([place])  C50.412    Z17.0       SUMMARY OF ONCOLOGIC HISTORY: Oncology History  Breast cancer of upper-outer quadrant of left female breast ([place])  12/22/2013 Mammogram   Left breast upper-outer quadrant 1.8 x 1.4 x 1.6 cm mass with left axillary lymph node measuring 3.6 mm   12/30/2013 Breast MRI   Dominant enhancing left upper outer quadrant irregular mass encompassing a confluent area of abnormal clumped nodular enhancement, 5.5 cm overall, 6 mm masslike enhancement central left breast     02/23/2014 Surgery   Left lumpectomy: Invasive ductal carcinoma grade 2 spending 3.8 cm intermediate grade DCIS with lymphovascular invasion margins negative one out of 4 SLN positive, ER 99%, PR 100%, HER-2 negative ratio 0.74, T2, N1, M0 stage IIB   03/19/2014 PET scan   No evidence of distant metastatic disease   03/31/2014 - 08/18/2014 Chemotherapy   Adjuvant chemotherapy with dose dense Adriamycin Cytoxan x4 followed by Abraxane weekly x12   08/28/2014 Imaging   Left brachial vein thrombus   09/11/2014 -  Radiation Therapy   Adjuvant radiation therapy   11/20/2014 -  Anti-estrogen oral therapy   Anastrozole 1 mg daily + Ibrance ([name] clinical trial)     CHIEF COMPLIANT: Follow-up of left breast cancer on anastrozole, new lump in the left breast  INTERVAL HISTORY: Michelle Kennedy is a 65 y.o. with above-mentioned history  of left breast cancer who underwent a lumpectomy, adjuvant chemotherapy, radiation, and is currently on anastrozole. She participated in the [place] clinical trial and took 2 years of Ibrance along with anastrozole. Mammogram on 09/30/20 showed no evidence of malignancy. She presents to the clinic today for follow-up.   ALLERGIES:  is allergic to bee venom, codeine, contrast media [iodinated diagnostic agents], iodine, latex, lidocaine, penicillins, pentazocine lactate, shellfish allergy, aspirin, erythromycin, symbicort [budesonide-formoterol fumarate], betadine [povidone iodine], compazine [prochlorperazine maleate], and sulfonamide derivatives.  MEDICATIONS:  Current Outpatient Medications  Medication Sig Dispense Refill   albuterol (PROVENTIL) (5 MG/ML) 0.5% nebulizer solution Take 0.5 mLs (2.5 mg total) by nebulization every 6 (six) hours as needed for wheezing or shortness of breath. 40 mL 3   albuterol (VENTOLIN HFA) 108 (90 Base) MCG/ACT inhaler USE 2 INHALATIONS ORALLY   EVERY 6 HOURS AS NEEDED FORWHEEZING OR SHORTNESS OF   BREATH. 3 each 1   ALPHAGAN P 0.1 % SOLN Place 1 drop into both eyes in the morning and at bedtime.      amLODipine (NORVASC) 5 MG tablet Take 1 tablet (5 mg total) by mouth daily. 90 tablet 1   anastrozole (ARIMIDEX) 1 MG tablet Take 1 tablet (1 mg total) by mouth daily. 90 tablet 2   atorvastatin (LIPITOR) 40 MG tablet Take 1 tablet (40 mg total) by mouth daily. 90 tablet 0   CALCIUM PO Take 1 tablet by mouth every evening.     cholecalciferol (VITAMIN D3) 25 MCG (1000 UNIT) tablet Take 1,000 Units by mouth every evening.      clopidogrel (PLAVIX)  75 MG tablet Take 1 tablet (75 mg total) by mouth daily. 90 tablet 3   fluticasone-salmeterol (WIXELA INHUB) 500-50 MCG/ACT AEPB Inhale 1 puff into the lungs in the morning and at bedtime. 60 each 6   furosemide (LASIX) 20 MG tablet TAKE 1 TABLET(20 MG) BY MOUTH DAILY AS NEEDED FOR SWELLING 30 tablet 3   gabapentin  (NEURONTIN) 100 MG capsule Take 2 capsules (200 mg total) by mouth 3 (three) times daily. 540 capsule 1   GEMTESA 75 MG TABS Take 1 tablet by mouth daily.     latanoprost (XALATAN) 0.005 % ophthalmic solution 1 drop at bedtime.     levothyroxine (SYNTHROID) 200 MCG tablet Take 1 tablet (200 mcg total) by mouth daily before breakfast. 90 tablet 1   lisinopril (ZESTRIL) 40 MG tablet Take 1 tablet (40 mg total) by mouth daily. 90 tablet 1   methocarbamol (ROBAXIN) 500 MG tablet TAKE 1 TABLET(500 MG) BY MOUTH EVERY 8 HOURS AS NEEDED FOR MUSCLE SPASMS 60 tablet 1   Multiple Vitamin (MULTIVITAMIN ADULT) TABS Take 1 tablet by mouth daily.     Multiple Vitamins-Minerals (OCUVITE PO) Take 1 tablet by mouth daily.     MYRBETRIQ 50 MG TB24 tablet TAKE 1 TABLET BY MOUTH  DAILY 90 tablet 1   Omega-3 Fatty Acids (FISH OIL) 1000 MG CAPS Take 1 capsule by mouth every evening.      pantoprazole (PROTONIX) 40 MG tablet TAKE 1 TABLET DAILY 90 tablet 1   traMADol (ULTRAM) 50 MG tablet Take 1 tablet (50 mg total) by mouth every 6 (six) hours as needed. 30 tablet 0   XELPROS 0.005 % EMUL Apply 1 drop to eye at bedtime.     No current facility-administered medications for this visit.    PHYSICAL EXAMINATION: ECOG PERFORMANCE STATUS: 1 - Symptomatic but completely ambulatory  There were no vitals filed for this visit. There were no vitals filed for this visit.  BREAST: No palpable masses or nodules in either right or left breasts. No palpable axillary supraclavicular or infraclavicular adenopathy no breast tenderness or nipple discharge. (exam performed in the presence of a chaperone)  LABORATORY DATA:  I have reviewed the data as listed CMP Latest Ref Rng & Units 06/28/2020 03/15/2020 03/01/2020  Glucose 70 - 99 mg/dL 82 110(H) 98  BUN 6 - 23 mg/dL _0 Creatinine 0.40 - 1.20 mg/dL 0.83 0.87 0.88  Sodium 135 - 145 mEq/L 140 141 141  Potassium 3.5 - 5.1 mEq/L 4.4 4.6 4.3  Chloride 96 - 112 mEq/L 103 104  105  CO2 19 - 32 mEq/L _1 Calcium 8.4 - 10.5 mg/dL 9.7 10.2 9.8  Total Protein 6.5 - 8.1 g/dL - - -  Total Bilirubin 0.3 - 1.2 mg/dL - - -  Alkaline Phos 38 - 126 U/L - - -  AST 15 - 41 U/L - - -  ALT 0 - 44 U/L - - -    Lab Results  Component Value Date   WBC 7.4 12/30/2019   HGB 14.5 12/30/2019   HCT 46.1 (H) 12/30/2019   MCV 78.4 (L) 12/30/2019   PLT 294 12/30/2019   NEUTROABS 4.8 12/30/2019    ASSESSMENT & PLAN:  Breast cancer of upper-outer quadrant of left female breast ([place]) Left breast invasive ductal carcinoma status post lumpectomy 3.8 cm, 1/4 SLN positive grade 2, ER 99%, PR 100%, HER-2 negative ratio 0.74; T2 N1 A. M0 stage IIB Adjuvant chemotherapy Completed 4 cycles  of dose dense Adriamycin and Cytoxan. Followed by Abraxane X 12 completed 08/18/14, status post radiation completed June 2016, started anastrozole 1 mg daily 11/20/2014 (also enrolled on [name] clinical trial randomized to [facility]) completed 2 years 01/23/2017   Breast Cancer Surveillance: 1. Breast exam: 01/14/2021: Benign 2. Mammogram 09/30/2020: Benign breast density category B  Weight gain: She tells me that her weight has been fluctuating and she had lost 10 pounds recently by exercising regularly.   Chemo induced peripheral neuropathy: We will see if she is interested in participating in the neuropathy clinical trial.  She rates it as 5-6 out of 10 Patient has a life expectancy that is greater than 6 months.   Hospitalization: 08/22/2019-08/23/2019 severe headache with right-sided visual disturbance: Stroke work-up negative, started on Plavix 75 mg daily   She has a 20 acres in [place] and is hoping to build a house and retire there at some point in the future. She has 1 more year of anastrozole after and after that she can discontinue it. She may be moving to [facility] next year.  If she does that then she can follow with her primary care physician for her annual mammograms and  follow-ups.  RTC one year for follow-up if she is still in this area.    No orders of the defined types were placed in this encounter.  The patient has a good understanding of the overall plan. she agrees with it. she will call with any problems that may develop before the next visit here.  Total time spent: 30 mins including face to face time and time spent for planning, charting and coordination of care  Rulon Eisenmenger, MD, MPH 01/14/2021  I, Michelle Kennedy, am acting as scribe for Dr. Nicholas Lose.  I have reviewed the above documentation for accuracy and completeness, and I agree with the above.

## 2021-01-13 NOTE — Assessment & Plan Note (Signed)
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 M0 stage IIB Adjuvant chemotherapy Completed 4 cycles of dose dense Adriamycin and Cytoxan. Followed by Abraxane X 12 completed 08/18/14, status post radiation completed June 2016, started anastrozole 1 mg daily 11/20/2014 (also enrolled on [name] clinical trial randomized to Ibrance)completed 2 years9/08/2016  Breast Cancer Surveillance: 1. Breast exam:01/14/2021: Benign 2. Mammogram5/04/2021: Benign breast density category B  Weight gain:She is struggling with weight issues.   Hospitalization: 08/22/2019-08/23/2019 severe headache with right-sided visual disturbance: Stroke work-up negative, started on Plavix 75 mg daily  She has a 20 acres in [place] and is hoping to build a house and retire there at some point in the future. RTCone yearfor follow-up

## 2021-01-14 ENCOUNTER — Inpatient Hospital Stay: Payer: 59 | Admitting: Emergency Medicine

## 2021-01-14 ENCOUNTER — Other Ambulatory Visit: Payer: Self-pay

## 2021-01-14 ENCOUNTER — Inpatient Hospital Stay: Payer: 59 | Attending: Hematology and Oncology | Admitting: Hematology and Oncology

## 2021-01-14 DIAGNOSIS — C50412 Malignant neoplasm of upper-outer quadrant of left female breast: Secondary | ICD-10-CM | POA: Diagnosis not present

## 2021-01-14 DIAGNOSIS — Z006 Encounter for examination for normal comparison and control in clinical research program: Secondary | ICD-10-CM

## 2021-01-14 DIAGNOSIS — Z170 Estrogen receptor positive status [ER+]: Secondary | ICD-10-CM | POA: Insufficient documentation

## 2021-01-14 MED ORDER — ANASTROZOLE 1 MG PO TABS: 1.0000 mg | ORAL_TABLET | Freq: Every day | ORAL | 3 refills | Status: DC

## 2021-01-14 NOTE — Research (Signed)
Trial: [facility]-2102 - TREATMENT OF ESTABLISHED CHEMOTHERAPY-INDUCED NEUROPATHY WITH N-PALMITOYLETHANOLAMIDE, A CANNABIMIMETIC NUTRACEUTICAL: A RANDOMIZED DOUBLE-BLIND PHASE II PILOT TRIAL  INTRODUCTION STUDY/CONSENT SIGNING  Patient Michelle Kennedy was identified by MD Lindi Adie as a potential candidate for the above listed study.  This Clinical Research Nurse met with [name], [id number], on 01/14/21 in a manner and location that ensures patient privacy to discuss participation in the above listed research study.  Patient is Unaccompanied.  A copy of the informed consent document with embedded HIPAA language was provided to the patient.  Patient reads, speaks, and understands [place].    Patient was provided with the business card of this Nurse and encouraged to contact the research team with any questions.  Patient was provided the option of taking informed consent documents home to review and was encouraged to review at their convenience with their support network, including other care providers. Patient is comfortable with making a decision regarding study participation today.  As outlined in the informed consent form, this Nurse and [name] discussed the purpose of the research study, the investigational nature of the study, study procedures and requirements for study participation, potential risks and benefits of study participation, as well as alternatives to participation. This study is double blinded. The patient understands participation is voluntary and they may withdraw from study participation at any time.  Each study arm was reviewed, and randomization discussed.  Potential side effects were reviewed with patient as outlined in the consent form, and patient made aware there may be side effects not yet known. The chance of receiving placebo was discussed. Patient understands enrollment is pending full eligibility review.   Confidentiality and how the patient's information  will be used as part of study participation were discussed.  Patient was informed there is not reimbursement provided for their time and effort spent on trial participation.  The patient is encouraged to discuss research study participation with their insurance provider to determine what costs they may incur as part of study participation, including research related injury.    All questions were answered to patient's satisfaction.  The informed consent with embedded HIPAA language was reviewed page by page.  The patient's mental and emotional status is appropriate to provide informed consent, and the patient verbalizes an understanding of study participation.  Patient has agreed to participate in the above listed research study and has voluntarily signed the informed consent version date 08/23/2020 with embedded HIPAA language, version date 08/23/2020  on 01/14/21 at 10:28 AM.  The patient was provided with a copy of the signed informed consent form with embedded HIPAA language for their reference.  No study specific procedures were obtained prior to the signing of the informed consent document.  Approximately 45 minutes were spent with the patient reviewing the informed consent documents.  Patient was not requested to complete a Release of Information form.  Reviewed medication list and allergies with patient.  Patient states she is willing to stop her Gabapentin for 1 week before she is registered/randomized to study.  Based on designated unblinded pharmacist Raul Del availability and to give the patient time to recover from an upcoming eye surgery the patient will be registered/randomized on Sept 12.  I am mailing out a copy of the Baseline Questionnaire for the patient to complete on date of randomization (Sept 12) and bring with her when she picks up study drug that afternoon.  Pt aware to stop taking gabapentin on Sept 5th (7 days before registration) and  verbalized understanding of all other  appts/instructions.  Will contact pt closer to date of registration to check on any changes or questions/concerns before drug dispense visit.  Confirmed the following with patient verbally: - No longer of childbearing status (hysterectomy) - Able to speak/read/comprehend English - Tingling, numbness, or pain symptoms of 7/10 today (01/14/21), with these symptoms occurring for more than 3 months prior to registration  Reviewed full list of exclusion criteria with patient who verbally denied all apart from taking gabapentin at this time.  Pt aware to contact Research/MD Gudena with any questions/concerns in the meantime.  Wells Guiles 'Hilton, RN, BSN Clinical Research Nurse I 01/14/21 12:34 PM

## 2021-01-18 ENCOUNTER — Encounter: Payer: Self-pay | Admitting: Family

## 2021-01-18 NOTE — Telephone Encounter (Signed)
Gwen, Can you please see if you can get her in sooner with a different dermatologist?  See phone note.

## 2021-01-21 ENCOUNTER — Telehealth: Payer: Self-pay | Admitting: Emergency Medicine

## 2021-01-21 NOTE — Telephone Encounter (Signed)
[facility]-[place]-2102 - TREATMENT OF ESTABLISHED CHEMOTHERAPY-INDUCED NEUROPATHY WITH N-PALMITOYLETHANOLAMIDE, A CANNABIMIMETIC NUTRACEUTICAL: A RANDOMIZED DOUBLE-BLIND PHASE II PILOT TRIAL  Contacted patient to remind her to stop taking Gabapentin for upcoming trial enrollment/study drug start, as well as to inquire about Tramadol prescription on medication list (prohibited per protocol).  Patient states that her eye surgeries (bilat) were cancelled and moved to 9/8 and 9/20, and that she does not feel comfortable starting the study drug during that time/stopping her gabapentin.  Patient's son was also just diagnosed yesterday with a brain tumor and will be undergoing surgery in October.  Patient agreed a follow up in mid-November to re-assess interest and availability for the trial, aware to call back before then with any questions/concerns.  MD Lindi Adie and PharmD Lin Givens made aware.  Michelle Guiles 'Learta CoddingNeysa Bonito, RN, BSN Clinical Research Nurse I 01/21/21 11:21 AM

## 2021-01-26 ENCOUNTER — Ambulatory Visit (INDEPENDENT_AMBULATORY_CARE_PROVIDER_SITE_OTHER): Payer: 59 | Admitting: Family

## 2021-01-26 ENCOUNTER — Other Ambulatory Visit: Payer: Self-pay

## 2021-01-26 VITALS — BP 155/84 | HR 69 | Temp 98.2°F | Resp 16 | Wt 286.0 lb

## 2021-01-26 DIAGNOSIS — R7303 Prediabetes: Secondary | ICD-10-CM

## 2021-01-26 DIAGNOSIS — I10 Essential (primary) hypertension: Secondary | ICD-10-CM

## 2021-01-26 DIAGNOSIS — Z23 Encounter for immunization: Secondary | ICD-10-CM | POA: Diagnosis not present

## 2021-01-26 DIAGNOSIS — J4540 Moderate persistent asthma, uncomplicated: Secondary | ICD-10-CM

## 2021-01-26 DIAGNOSIS — I5032 Chronic diastolic (congestive) heart failure: Secondary | ICD-10-CM

## 2021-01-26 DIAGNOSIS — E890 Postprocedural hypothyroidism: Secondary | ICD-10-CM

## 2021-01-26 DIAGNOSIS — E782 Mixed hyperlipidemia: Secondary | ICD-10-CM

## 2021-01-26 NOTE — Patient Instructions (Addendum)
Please complete lab work prior to leaving.  Check blood pressure once daily for a few days and then send me your readings via mychat.  Follow up in 3 Months

## 2021-01-26 NOTE — Progress Notes (Signed)
Subjective:   By signing my name below, I, Shehryar Baig, attest that this documentation has been prepared under the direction and in the presence of Debbrah Alar NP. 01/26/2021     Patient ID: Michelle Kennedy, female    DOB: Sep 15, 1955, 65 y.o.   MRN: [id number]  Chief Complaint  Patient presents with   Fibromyalgia    Here for follow up   Hypertension    Here for follow up   Hypothyroidism    HPI Patient is in today for a office visit.  Immunizations- She is interested in getting the flu vaccine during this visit. Cataract procedure- She has a cataract procedure tomorrow.  Blood pressure- Her blood pressure is elevated during this visit. She stopped taking 75 mg Plavix daily PO, 20 mg lasix daily PO due to having a procedure tomorrow (cataract surgery).  She has a blood pressure cuff at home and is willing to record her blood pressure at home.   BP Readings from Last 3 Encounters:  01/26/21 (!) 155/84  01/14/21 (!) 148/77  12/28/20 126/80   Pulse Readings from Last 3 Encounters:  01/26/21 69  01/14/21 80  12/28/20 82   Blood sugar- She thinks her blood sugar is high. She feels tired easily and has started eating more protein in her diet to manage her symptoms.   Lab Results  Component Value Date   HGBA1C 6.2 10/26/2020   Skin keratosis- She continues having skin keratosis on her abdomen. It is not bothering her at this time.  Diet- Her and her husband has started seeing a dietician to manage her diet. Exercise- She started participating in regular exercise recently.  Asthma- She continues taking Advair 500 and reports no issues while taking it.    Health Maintenance Due  Topic Date Due   PNA vac Low Risk Adult (1 of 2 - PCV13) 06/19/2019   COLONOSCOPY (Pts 45-69yr Insurance coverage will need to be confirmed)  08/28/2019    Past Medical History:  Diagnosis Date   Abnormally small mouth    Achilles tendon disorder, right    Anemia    Anxiety     Arthritis    hips and knees   Asthma    daily inhaler, prn inhaler and neb.   Asthma due to environmental allergies    [place] [place]   left   Chest pain    COPD (chronic obstructive pulmonary disease) (HCC)    Dental bridge present    upper front and lower right   Dental crowns present    x 3   Depression    Esophageal spasm    reports since her chemo and breast surgery  she developed esophageal spasms and reports this is in the past has casue her 02 to desat in the  70s , denies sycnope in relation to this , does report hx of vertigo as well    Family history of anesthesia complication    twin brother aspirated and died on OR table, per pt.   Fibromyalgia    Gallbladder disease    Gallstones    GERD (gastroesophageal reflux disease)    Glaucoma    H/O blood clots    had blood to  PICC line    History of gastric ulcer    as a teenager   History of seizure age 65  as a reaction to Penicillin - no seizures since   History of stomach ulcers    History of thyroid  cancer    s/p thyroidectomy   HTN (hypertension)    Hyperlipidemia    Hypothyroidism    Joint pain    Left knee injury    Liver disorder    seen when she had her hysterectomy , reports she was told it was  " small lesion" ; but asymptomatic    Lymphedema    left arm   Migraines    Multiple food allergies    Obesity    OSA (obstructive sleep apnea) 02/16/2016   CPAP   Palpitations    reports no longer experiences   Personal history of chemotherapy 2015   Personal history of radiation therapy 2015   Left   Prediabetes    Rheumatoid arthritis (HCC)    SOB (shortness of breath)    Swelling of both ankles    Tinnitus    UTI (lower urinary tract infection) 02/17/2014   Vertigo    last episode  was 2 weeks ago    Vitamin D deficiency    Wears contact lenses    left eye only    Wears hearing aid in both ears     Past Surgical History:  Procedure Laterality Date   ABDOMINAL HYSTERECTOMY   2004   complete   ACHILLES TENDON REPAIR Right    APPENDECTOMY  2004   BREAST EXCISIONAL BIOPSY     BREAST LUMPECTOMY Left    2015   BREAST LUMPECTOMY WITH NEEDLE LOCALIZATION AND AXILLARY SENTINEL LYMPH NODE BX Left 02/23/2014   Procedure: BREAST LUMPECTOMY WITH NEEDLE LOCALIZATION AND AXILLARY SENTINEL LYMPH NODE BIOPSY;  Surgeon: Excell Seltzer, MD;  Location: [facility];  Service: General;  Laterality: Left;   BREAST SURGERY  2011   left breast biposy   CHOLECYSTECTOMY  1990   COLONOSCOPY W/ POLYPECTOMY  06/2009   EYE SURGERY Right 2013   exc. warts from underneath eyelid   INCONTINENCE SURGERY  2004   KNEE ARTHROSCOPY Bilateral    x 6 each knee   LIGAMENT REPAIR Right    thumb/wrist   ORIF TOE FRACTURE Right    great toe   PORTACATH PLACEMENT N/A 03/24/2014   Procedure: INSERTION PORT-A-CATH;  Surgeon: Excell Seltzer, MD;  Location: [facility];  Service: General;  Laterality: N/A;; removed    THYROIDECTOMY  2000   TONSILLECTOMY AND ADENOIDECTOMY  2000   TOTAL KNEE ARTHROPLASTY Left 12/03/2017   Procedure: LEFT TOTAL KNEE ARTHROPLASTY;  Surgeon: Gaynelle Arabian, MD;  Location: [facility];  Service: Orthopedics;  Laterality: Left;   TUMOR EXCISION     from thoracic spine    Family History  Problem Relation Age of Onset   Alcohol abuse Mother    Arthritis Mother    Hypertension Mother    Bipolar disorder Mother    Breast cancer Mother 74       unconfirmed   Lung cancer Mother 66       smoker   Hyperlipidemia Mother    Stroke Mother    Cancer Mother    Depression Mother    Anxiety disorder Mother    Alcoholism Mother    Drug abuse Mother    Eating disorder Mother    Obesity Mother    Alcohol abuse Father    Hyperlipidemia Father    Kidney disease Father    Diabetes Father    Hypertension Father    Cystic kidney disease Father    Thyroid disease Father    Liver disease Father  Alcoholism Father    Lung cancer Father    Thyroid  cancer Sister 50       type?; currently 62   Other Sister        ovarian tumor @ 15; TAH/BSO   Breast cancer Sister    Thyroid cancer Brother        dx 39s; currently 23   Breast cancer Maternal Aunt        dx 23s; deceased 59   Thyroid cancer Paternal 59        All 3 paternal aunts with thyroid ca in 30s/40s   Lung cancer Paternal Aunt        2 of 3 paternal aunts with lung cancer   Arthritis Maternal Grandmother    Diabetes Paternal Grandmother    Heart disease Other    COPD Other    Asthma Other     Social History   Socioeconomic History   Marital status: Married    Spouse name: Not on file   Number of children: 2   Years of education: Not on file   Highest education level: Not on file  Occupational History   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   Social Determinants of Radio broadcast assistant Strain: Not on file  Food Insecurity: No Food Insecurity   Worried About Charity fundraiser in the Last Year: Never true   Ran Out of Food in the Last Year: Never true  Transportation Needs: Not on file  Physical Activity: Not on file  Stress: Not on file  Social Connections: Not on file  Intimate Partner Violence: Not on file    Outpatient Medications Prior to Visit  Medication Sig Dispense Refill   albuterol (PROVENTIL) (5 MG/ML) 0.5% nebulizer solution Take 0.5 mLs (2.5 mg total) by nebulization every 6 (six) hours as needed for wheezing or shortness of breath. 40  mL 3   albuterol (VENTOLIN HFA) 108 (90 Base) MCG/ACT inhaler USE 2 INHALATIONS ORALLY   EVERY 6 HOURS AS NEEDED FORWHEEZING OR SHORTNESS OF   BREATH. 3 each 1   ALPHAGAN P 0.1 % SOLN Place 1 drop into both eyes in the morning and at bedtime.      amLODipine (NORVASC) 5 MG tablet Take 1 tablet (5 mg total) by mouth daily. 90 tablet 1   anastrozole (ARIMIDEX) 1 MG tablet Take 1 tablet (1 mg total) by mouth daily. 90 tablet 3   atorvastatin (LIPITOR) 40 MG tablet Take 1 tablet (40 mg total) by mouth daily. 90 tablet 0   CALCIUM PO Take 1 tablet by mouth every evening.     cholecalciferol (VITAMIN D3) 25 MCG (1000 UNIT) tablet Take 1,000 Units by mouth every evening.      clopidogrel (PLAVIX) 75 MG tablet Take 1 tablet (75 mg total) by mouth daily. 90 tablet 3  ergocalciferol (VITAMIN D2) 1.25 MG (50000 UT) capsule Take 1 capsule by mouth daily.     fluticasone-salmeterol (WIXELA INHUB) 500-50 MCG/ACT AEPB Inhale 1 puff into the lungs in the morning and at bedtime. 60 each 6   furosemide (LASIX) 20 MG tablet TAKE 1 TABLET(20 MG) BY MOUTH DAILY AS NEEDED FOR SWELLING 30 tablet 3   gabapentin (NEURONTIN) 100 MG capsule Take 2 capsules (200 mg total) by mouth 3 (three) times daily. 540 capsule 1   GEMTESA 75 MG TABS Take 1 tablet by mouth daily.     latanoprost (XALATAN) 0.005 % ophthalmic solution 1 drop at bedtime.     levothyroxine (SYNTHROID) 200 MCG tablet Take 1 tablet (200 mcg total) by mouth daily before breakfast. 90 tablet 1   lisinopril (ZESTRIL) 40 MG tablet Take 1 tablet (40 mg total) by mouth daily. 90 tablet 1   loratadine-pseudoephedrine (CLARITIN-D 24-HOUR) 10-240 MG 24 hr tablet Take 1 tablet by mouth daily.     meclizine (ANTIVERT) 25 MG tablet Take by mouth.     methocarbamol (ROBAXIN) 500 MG tablet TAKE 1 TABLET(500 MG) BY MOUTH EVERY 8 HOURS AS NEEDED FOR MUSCLE SPASMS 60 tablet 1   Multiple Vitamin (MULTIVITAMIN ADULT) TABS Take 1 tablet by mouth daily.     Multiple  Vitamins-Minerals (OCUVITE PO) Take 1 tablet by mouth daily.     MYRBETRIQ 50 MG TB24 tablet TAKE 1 TABLET BY MOUTH  DAILY 90 tablet 1   Omega-3 Fatty Acids (FISH OIL) 1000 MG CAPS Take 1 capsule by mouth every evening.      pantoprazole (PROTONIX) 40 MG tablet TAKE 1 TABLET DAILY 90 tablet 1   traMADol (ULTRAM) 50 MG tablet Take 1 tablet (50 mg total) by mouth every 6 (six) hours as needed. 30 tablet 0   XELPROS 0.005 % EMUL Apply 1 drop to eye at bedtime.     No facility-administered medications prior to visit.    Allergies  Allergen Reactions   Bee Venom Anaphylaxis   Codeine Shortness Of Breath and Rash   Contrast Media [Iodinated Diagnostic Agents] Shortness Of Breath   Gadolinium Derivatives Hives, Rash, Shortness Of Breath and Swelling    "my heart stopped beating"    Iodine Other (See Comments)    CARDIAC ARREST   Latex Anaphylaxis and Rash   Lidocaine Shortness Of Breath and Swelling    SWELLING OF MOUTH AND THROAT, low BP   Penicillins Shortness Of Breath, Rash and Other (See Comments)    SEIZURE Did it involve swelling of the face/tongue/throat, SOB, or low BP? no Did it involve sudden or severe rash/hives, skin peeling, or any reaction on the inside of your mouth or nose? yes Did you need to seek medical attention at a hospital or doctor's office? yes When did it last happen?      2010 If all above answers are "NO", may proceed with cephalosporin use.    Pentazocine Lactate Shortness Of Breath and Rash   Shellfish Allergy Shortness Of Breath and Rash   Aspirin Rash and Other (See Comments)    GI UPSET   Erythromycin Swelling and Rash    SWELLING OF JOINTS   Symbicort [Budesonide-Formoterol Fumarate] Other (See Comments)    BURNING OF TONGUE AND LIPS   Betadine [Povidone Iodine]     Rash. Breathing problems.    Compazine [Prochlorperazine Maleate] Rash    Rash on face,chest, arms, back   Sulfonamide Derivatives Rash    Review of Systems  Skin:         (+)  skin keratosis in abdomen      Objective:    Physical Exam Constitutional:      General: She is not in acute distress.    Appearance: Normal appearance. She is not ill-appearing.  HENT:     Head: Normocephalic and atraumatic.     Right Ear: External ear normal.     Left Ear: External ear normal.  Eyes:     Extraocular Movements: Extraocular movements intact.     Pupils: Pupils are equal, round, and reactive to light.  Cardiovascular:     Rate and Rhythm: Normal rate and regular rhythm.     Heart sounds: Normal heart sounds. No murmur heard.   No gallop.  Pulmonary:     Effort: Pulmonary effort is normal. No respiratory distress.     Breath sounds: Normal breath sounds. No wheezing or rales.  Skin:    General: Skin is warm and dry.     Comments: Skin keratosis right upper abdomen  Neurological:     Mental Status: She is alert and oriented to person, place, and time.  Psychiatric:        Behavior: Behavior normal.    BP (!) 155/84 (BP Location: Right Arm, Patient Position: Sitting, Cuff Size: Large)   Pulse 69   Temp 98.2 F (36.8 C) (Oral)   Resp 16   Wt 286 lb (129.7 kg)   SpO2 98%   BMI 44.79 kg/m  Wt Readings from Last 3 Encounters:  01/26/21 286 lb (129.7 kg)  01/14/21 288 lb 3.2 oz (130.7 kg)  10/26/20 287 lb (130.2 kg)       Assessment & Plan:   Problem List Items Addressed This Visit       Unprioritized   Postsurgical hypothyroidism (Chronic)    Lab Results  Component Value Date   TSH 0.65 03/01/2020  She has follow up with endocrionology.        Chronic diastolic CHF (congestive heart failure) (HCC) (Chronic)    Wt Readings from Last 3 Encounters:  01/26/21 286 lb (129.7 kg)  01/14/21 288 lb 3.2 oz (130.7 kg)  10/26/20 287 lb (130.2 kg)  Weight stable.  She will resume her lasix after her cataract surgery.       Prediabetes - Primary    Lab Results  Component Value Date   HGBA1C 6.2 10/26/2020   HGBA1C 5.8 (H) 03/01/2020   HGBA1C 6.1  (H) 08/23/2019   Lab Results  Component Value Date   LDLCALC 77 06/28/2020   CREATININE 0.83 06/28/2020        Relevant Orders   Hemoglobin A1c   Hyperlipidemia    Lab Results  Component Value Date   CHOL 164 06/28/2020   HDL 55.10 06/28/2020   LDLCALC 77 06/28/2020   LDLDIRECT 117.0 02/26/2019   TRIG 157.0 (H) 06/28/2020   CHOLHDL 3 06/28/2020        Essential hypertension    BP Readings from Last 3 Encounters:  01/26/21 (!) 155/84  01/14/21 (!) 148/77  12/28/20 126/80  BP elevated today. Possibly due to not being on lasix.  Pt will check her bp once daily for a few days and send me her readings via mychart.        Relevant Orders   Basic metabolic panel   Asthma    Improved since advair was increased from 250 to 500.  Reports no issues since.          No orders of the defined types were placed  in this encounter.   I, Debbrah Alar NP, personally preformed the services described in this documentation.  All medical record entries made by the scribe were at my direction and in my presence.  I have reviewed the chart and discharge instructions (if applicable) and agree that the record reflects my personal performance and is accurate and complete. 01/26/2021   I,Shehryar Baig,acting as a scribe for Nance Pear, NP.,have documented all relevant documentation on the behalf of Nance Pear, NP,as directed by  Nance Pear, NP while in the presence of Nance Pear, NP.   Nance Pear, NP

## 2021-01-26 NOTE — Assessment & Plan Note (Signed)
Improved since advair was increased from 250 to 500.  Reports no issues since.

## 2021-01-26 NOTE — Assessment & Plan Note (Signed)
Lab Results  Component Value Date   CHOL 164 06/28/2020   HDL 55.10 06/28/2020   LDLCALC 77 06/28/2020   LDLDIRECT 117.0 02/26/2019   TRIG 157.0 (H) 06/28/2020   CHOLHDL 3 06/28/2020

## 2021-01-26 NOTE — Assessment & Plan Note (Signed)
Wt Readings from Last 3 Encounters:  01/26/21 286 lb (129.7 kg)  01/14/21 288 lb 3.2 oz (130.7 kg)  10/26/20 287 lb (130.2 kg)   Weight stable.  She will resume her lasix after her cataract surgery.

## 2021-01-26 NOTE — Assessment & Plan Note (Signed)
Lab Results  Component Value Date   HGBA1C 6.2 10/26/2020   HGBA1C 5.8 (H) 03/01/2020   HGBA1C 6.1 (H) 08/23/2019   Lab Results  Component Value Date   LDLCALC 77 06/28/2020   CREATININE 0.83 06/28/2020

## 2021-01-26 NOTE — Assessment & Plan Note (Signed)
Lab Results  Component Value Date   TSH 0.65 03/01/2020   She has follow up with endocrionology.

## 2021-01-26 NOTE — Addendum Note (Signed)
Addended by: Jiles Prows on: 01/26/2021 10:43 AM   Modules accepted: Orders

## 2021-01-26 NOTE — Assessment & Plan Note (Addendum)
BP Readings from Last 3 Encounters:  01/26/21 (!) 155/84  01/14/21 (!) 148/77  12/28/20 126/80   BP elevated today. Possibly due to not being on lasix.  Pt will check her bp once daily for a few days and send me her readings via mychart.

## 2021-01-30 ENCOUNTER — Other Ambulatory Visit: Payer: Self-pay | Admitting: Family

## 2021-01-31 ENCOUNTER — Encounter: Payer: 59 | Admitting: Emergency Medicine

## 2021-02-06 ENCOUNTER — Other Ambulatory Visit: Payer: Self-pay | Admitting: Family

## 2021-02-20 ENCOUNTER — Other Ambulatory Visit: Payer: Self-pay | Admitting: Family

## 2021-02-27 ENCOUNTER — Encounter: Payer: Self-pay | Admitting: Family

## 2021-02-28 ENCOUNTER — Other Ambulatory Visit: Payer: Self-pay | Admitting: Pulmonary Disease

## 2021-03-01 MED ORDER — MECLIZINE HCL 25 MG PO TABS: 25.0000 mg | ORAL_TABLET | Freq: Three times a day (TID) | ORAL | 0 refills | Status: DC | PRN

## 2021-03-15 ENCOUNTER — Other Ambulatory Visit: Payer: Self-pay

## 2021-03-15 ENCOUNTER — Ambulatory Visit (INDEPENDENT_AMBULATORY_CARE_PROVIDER_SITE_OTHER): Payer: 59 | Admitting: Internal Medicine

## 2021-03-15 ENCOUNTER — Encounter: Payer: Self-pay | Admitting: Internal Medicine

## 2021-03-15 VITALS — BP 128/79 | HR 87 | Ht 67.0 in | Wt 284.2 lb

## 2021-03-15 DIAGNOSIS — E890 Postprocedural hypothyroidism: Secondary | ICD-10-CM | POA: Diagnosis not present

## 2021-03-15 DIAGNOSIS — C73 Malignant neoplasm of thyroid gland: Secondary | ICD-10-CM

## 2021-03-15 LAB — TSH: TSH: 0.31 u[IU]/mL — ABNORMAL LOW (ref 0.35–5.50)

## 2021-03-15 LAB — T4, FREE: Free T4: 1.18 ng/dL (ref 0.60–1.60)

## 2021-03-15 NOTE — Progress Notes (Signed)
Patient ID: Michelle Kennedy, female   DOB: 29-Oct-1955, 65 y.o.   MRN: [id number]   This visit occurred during the SARS-CoV-2 public health emergency.  Safety protocols were in place, including screening questions prior to the visit, additional usage of staff PPE, and extensive cleaning of exam room while observing appropriate contact time as indicated for disinfecting solutions.   HPI  Michelle Kennedy is a 65 y.o.-year-old female, returning for follow-up for h/o papillary thyroid cancer and postsurgical hypothyroidism. She saw Dr. Iran Planas before. Last visit with me in 1 year and 8 months ago.  Interim history: Son was recently sick - seizures, had cranial surgery. She is very stressed by this. She is otherwise feeling well, without complaints.  Reviewed history: Pt. has been dx with ThyCA in 2002, and had total thyroidectomy then [facility]). No RAI tx 2/2 severe iodine allergy reportedly, no postop U/S's checked.  Neck U/S (12/2016): Complete thyroidectomy without significant abnormality by ultrasound  She was previously on levothyroxine 200 mcg daily but at last visit we decreased this to 175 mcg daily.  She takes the levothyroxine: - in am - fasting - at least 30 min from b'fast -+ Calcium at night - 1-2 Tums at bedtime 2x a week - + MVI at night - no PPIs at night - not on Biotin - on vitamin D   Reviewed patient's TFTs:: Lab Results  Component Value Date   TSH 0.65 03/01/2020   TSH 0.41 12/09/2019   TSH 0.20 (L) 07/15/2019   TSH 2.01 07/05/2018   TSH 6.40 (H) 05/30/2018   TSH 14.60 (H) 05/01/2018   TSH 0.82 09/17/2017   TSH 2.91 03/20/2017   TSH 0.84 12/12/2016   TSH 5.600 (H) 09/20/2016   FREET4 1.13 12/09/2019   FREET4 1.14 07/15/2019   FREET4 1.12 09/17/2017   FREET4 0.87 03/20/2017   FREET4 0.90 12/12/2016   FREET4 1.16 09/20/2016   Prev. Very suppressed TSH levels per review of Care Everywhere records.  Thyroglobulin levels are low, but  detectable: Lab Results  Component Value Date   THYROGLB 0.1 (L) 12/09/2019   THYROGLB 0.3 (L) 07/15/2019   THYROGLB 0.1 (L) 09/17/2017   THYROGLB 0.1 (L) 12/12/2016   Lab Results  Component Value Date   THGAB <1 12/09/2019   THGAB <1 07/15/2019   THGAB <1 09/17/2017   THGAB <1 12/12/2016   10/11/2015: Tg <0.1, ATA <1  Pt denies: - feeling nodules in neck - hoarseness - dysphagia - choking - SOB with lying down  She has + FH of thyroid disorders in: siblings: goiter. + FH of thyroid cancer: Paternal aunts and uncle. No history of radiation therapy to head or neck . Had RxTx for BrCA (diagnosed in 2016)-but not to the neck.  She was seeing Dr. Dennard Nip in [facility] >> stopped She has fibromyalgia. She will have a TKR in 09/2017. He also has diabetes and would like to start on Mounjaro.  ROS: + See HPI  I reviewed pt's medications, allergies, PMH, social hx, family hx, and changes were documented in the history of present illness. Otherwise, unchanged from my initial visit note.  Past Medical History:  Diagnosis Date   Abnormally small mouth    Achilles tendon disorder, right    Anemia    Anxiety    Arthritis    hips and knees   Asthma    daily inhaler, prn inhaler and neb.   Asthma due to environmental allergies  Breast cancer ([place]) 01/2014   left   Chest pain    COPD (chronic obstructive pulmonary disease) (HCC)    Dental bridge present    upper front and lower right   Dental crowns present    x 3   Depression    Esophageal spasm    reports since her chemo and breast surgery  she developed esophageal spasms and reports this is in the past has casue her 02 to desat in the  70s , denies sycnope in relation to this , does report hx of vertigo as well    Family history of anesthesia complication    twin brother aspirated and died on OR table, per pt.   Fibromyalgia    Gallbladder disease    Gallstones    GERD (gastroesophageal reflux  disease)    Glaucoma    H/O blood clots    had blood to  PICC line    History of gastric ulcer    as a teenager   History of seizure age 31   as a reaction to Penicillin - no seizures since   History of stomach ulcers    History of thyroid cancer    s/p thyroidectomy   HTN (hypertension)    Hyperlipidemia    Hypothyroidism    Joint pain    Left knee injury    Liver disorder    seen when she had her hysterectomy , reports she was told it was  " small lesion" ; but asymptomatic    Lymphedema    left arm   Migraines    Multiple food allergies    Obesity    OSA (obstructive sleep apnea) 02/16/2016   CPAP   Palpitations    reports no longer experiences   Personal history of chemotherapy 2015   Personal history of radiation therapy 2015   Left   Prediabetes    Rheumatoid arthritis (HCC)    SOB (shortness of breath)    Swelling of both ankles    Tinnitus    UTI (lower urinary tract infection) 02/17/2014   Vertigo    last episode  was 2 weeks ago    Vitamin D deficiency    Wears contact lenses    left eye only    Wears hearing aid in both ears    Past Surgical History:  Procedure Laterality Date   ABDOMINAL HYSTERECTOMY  2004   complete   ACHILLES TENDON REPAIR Right    APPENDECTOMY  2004   BREAST EXCISIONAL BIOPSY     BREAST LUMPECTOMY Left    2015   BREAST LUMPECTOMY WITH NEEDLE LOCALIZATION AND AXILLARY SENTINEL LYMPH NODE BX Left 02/23/2014   Procedure: BREAST LUMPECTOMY WITH NEEDLE LOCALIZATION AND AXILLARY SENTINEL LYMPH NODE BIOPSY;  Surgeon: Excell Seltzer, MD;  Location: [place] [facility];  Service: General;  Laterality: Left;   BREAST SURGERY  2011   left breast biposy   CHOLECYSTECTOMY  1990   COLONOSCOPY W/ POLYPECTOMY  06/2009   EYE SURGERY Right 2013   exc. warts from underneath eyelid   INCONTINENCE SURGERY  2004   KNEE ARTHROSCOPY Bilateral    x 6 each knee   LIGAMENT REPAIR Right    thumb/wrist   ORIF TOE FRACTURE Right    great toe    PORTACATH PLACEMENT N/A 03/24/2014   Procedure: INSERTION PORT-A-CATH;  Surgeon: Excell Seltzer, MD;  Location: [place];  Service: General;  Laterality: N/A;; removed    THYROIDECTOMY  2000  TONSILLECTOMY AND ADENOIDECTOMY  2000   TOTAL KNEE ARTHROPLASTY Left 12/03/2017   Procedure: LEFT TOTAL KNEE ARTHROPLASTY;  Surgeon: Gaynelle Arabian, MD;  Location: [facility];  Service: Orthopedics;  Laterality: Left;   TUMOR EXCISION     from thoracic spine   Social History   Social History   Marital status: Married    Spouse name: N/A   Number of children: 2   Occupational History   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 40 mL 3   albuterol (VENTOLIN HFA) 108 (90 Base) MCG/ACT inhaler USE 2 INHALATIONS ORALLY   EVERY 6 HOURS AS NEEDED FORWHEEZING OR SHORTNESS OF   BREATH. 3 each 1   ALPHAGAN P 0.1 % SOLN Place 1 drop into both eyes in the morning and at bedtime.      amLODipine (NORVASC) 5 MG tablet Take 1 tablet (5 mg total) by mouth daily. 90 tablet 1   anastrozole (ARIMIDEX) 1 MG tablet Take 1 tablet (1 mg total) by mouth daily. 90 tablet 3   atorvastatin (LIPITOR) 40 MG tablet Take 1 tablet (40 mg total) by mouth daily. 90 tablet 0   CALCIUM PO Take 1 tablet by mouth every evening.     cholecalciferol (VITAMIN D3) 25 MCG (1000 UNIT) tablet Take 1,000 Units by mouth every evening.      clopidogrel (PLAVIX) 75 MG tablet Take 1 tablet (75 mg total) by mouth daily. 90 tablet  3   ergocalciferol (VITAMIN D2) 1.25 MG (50000 UT) capsule Take 1 capsule by mouth daily.     furosemide (LASIX) 20 MG tablet TAKE 1 TABLET(20 MG) BY MOUTH DAILY AS NEEDED FOR SWELLING 30 tablet 3   gabapentin (NEURONTIN) 100 MG capsule Take 2 capsules (200 mg total) by mouth 3 (three) times daily. 540 capsule 1   GEMTESA 75 MG TABS Take 1 tablet by mouth daily.     latanoprost (XALATAN) 0.005 % ophthalmic solution 1 drop at bedtime.     lisinopril (ZESTRIL) 20 MG tablet TAKE 2 TABLETS DAILY 135 tablet 1   loratadine-pseudoephedrine (CLARITIN-D 24-HOUR) 10-240 MG 24 hr tablet Take 1 tablet by mouth daily.     meclizine (ANTIVERT) 25 MG tablet Take 1 tablet (25 mg total) by mouth 3 (three) times daily as needed for dizziness. 30 tablet 0   methocarbamol (ROBAXIN) 500 MG tablet TAKE 1 TABLET(500 MG) BY MOUTH EVERY 8 HOURS AS NEEDED FOR MUSCLE SPASMS 60 tablet 1   Multiple Vitamin (MULTIVITAMIN ADULT) TABS Take 1 tablet by mouth daily.     Multiple Vitamins-Minerals (OCUVITE PO) Take 1 tablet by mouth daily.     MYRBETRIQ 50 MG TB24 tablet TAKE 1 TABLET BY MOUTH  DAILY 90 tablet 1   Omega-3 Fatty Acids (FISH OIL) 1000 MG CAPS Take 1 capsule by mouth every evening.      pantoprazole (PROTONIX) 40 MG tablet TAKE 1 TABLET(40 MG) BY MOUTH DAILY 90 tablet 1  SYNTHROID 200 MCG tablet TAKE 1 TABLET BY MOUTH     DAILY BEFORE BREAKFAST 90 tablet 1   traMADol (ULTRAM) 50 MG tablet Take 1 tablet (50 mg total) by mouth every 6 (six) hours as needed. 30 tablet 0   XELPROS 0.005 % EMUL Apply 1 drop to eye at bedtime.     No current facility-administered medications on file prior to visit.   Allergies  Allergen Reactions   Bee Venom Anaphylaxis   Codeine Shortness Of Breath and Rash   Contrast Media [Iodinated Diagnostic Agents] Shortness Of Breath   Gadolinium Derivatives Hives, Rash, Shortness Of Breath and Swelling    "my heart stopped beating"    Iodine Other (See Comments)    CARDIAC ARREST    Latex Anaphylaxis and Rash   Lidocaine Shortness Of Breath and Swelling    SWELLING OF MOUTH AND THROAT, low BP   Penicillins Shortness Of Breath, Rash and Other (See Comments)    SEIZURE Did it involve swelling of the face/tongue/throat, SOB, or low BP? no Did it involve sudden or severe rash/hives, skin peeling, or any reaction on the inside of your mouth or nose? yes Did you need to seek medical attention at a hospital or doctor's office? yes When did it last happen?      2010 If all above answers are "NO", may proceed with cephalosporin use.    Pentazocine Lactate Shortness Of Breath and Rash   Shellfish Allergy Shortness Of Breath and Rash   Aspirin Rash and Other (See Comments)    GI UPSET   Erythromycin Swelling and Rash    SWELLING OF JOINTS   Symbicort [Budesonide-Formoterol Fumarate] Other (See Comments)    BURNING OF TONGUE AND LIPS   Betadine [Povidone Iodine]     Rash. Breathing problems.    Compazine [Prochlorperazine Maleate] Rash    Rash on face,chest, arms, back   Sulfonamide Derivatives Rash   Family History  Problem Relation Age of Onset   Alcohol abuse Mother    Arthritis Mother    Hypertension Mother    Bipolar disorder Mother    Breast cancer Mother 78       unconfirmed   Lung cancer Mother 52       smoker   Hyperlipidemia Mother    Stroke Mother    Cancer Mother    Depression Mother    Anxiety disorder Mother    Alcoholism Mother    Drug abuse Mother    Eating disorder Mother    Obesity Mother    Alcohol abuse Father    Hyperlipidemia Father    Kidney disease Father    Diabetes Father    Hypertension Father    Cystic kidney disease Father    Thyroid disease Father    Liver disease Father    Alcoholism Father    Lung cancer Father    Thyroid cancer Sister 40       type?; currently 59   Other Sister        ovarian tumor @ 35; TAH/BSO   Breast cancer Sister    Thyroid cancer Brother        dx 80s; currently 49   Breast cancer Maternal  Aunt        dx 46s; deceased 40   Thyroid cancer Paternal 23        All 3 paternal aunts with thyroid ca in 30s/40s   Lung cancer Paternal Aunt        2 of  3 paternal aunts with lung cancer   Arthritis Maternal Grandmother    Diabetes Paternal Grandmother    Heart disease Other    COPD Other    Asthma Other     PE: BP 128/79 (BP Location: Right Arm, Patient Position: Sitting, Cuff Size: Normal)   Pulse 87   Ht 5' 7"  (1.702 m)   Wt 284 lb 3.2 oz (128.9 kg)   SpO2 97%   BMI 44.51 kg/m  Wt Readings from Last 3 Encounters:  03/15/21 284 lb 3.2 oz (128.9 kg)  01/26/21 286 lb (129.7 kg)  01/14/21 288 lb 3.2 oz (130.7 kg)   Constitutional: overweight, in NAD Eyes: PERRLA, EOMI, no exophthalmos ENT: moist mucous membranes, no neck masses, no cervical lymphadenopathy Cardiovascular: RRR, No MRG Respiratory: CTA B Gastrointestinal: abdomen soft, NT, ND, BS+ Musculoskeletal: no deformities, strength intact in all 4 Skin: moist, warm, no rashes Neurological: + slight tremor with outstretched L hand, DTR normal in all 4  ASSESSMENT: 1. H/O PTC  2. Hypothyroidism  PLAN:  1. PTC -I do not have records; she did not have RAI treatment because of allergy to iodine. -Previous thyroglobulin levels were undetectable, as were her ATA antibodies per records reviewed from Dr. Tamala Julian.  Thyroglobulin increased afterwards, with thelevel being 0.3 in 06/2019, at last visit.  We discussed that this is not uncommon due to assay variability and also due to the fact that she did not have RAI treatment.  Indeed, we repeated her thyroglobulin in 11/2019 and this decreased to 0.1. -At this visit, we will recheck her thyroglobulin and ATA antibodies. -We reviewed the latest neck ultrasound from 12/2016: No metastases or recurrence is in the neck  2. Patient with longstanding postsurgical hypothyroidism, on levothyroxine treatment - latest thyroid labs reviewed with pt. >> normal: Lab Results   Component Value Date   TSH 0.65 03/01/2020  - she continues on LT4 175 mcg daily - pt feels good on this dose. - we discussed about taking the thyroid hormone every day, with water, >30 minutes before breakfast, separated by >4 hours from acid reflux medications, calcium, iron, multivitamins. Pt. is taking it correctly. - will check thyroid tests today: TSH and fT4 - If labs are abnormal, she will need to return for repeat TFTs in 1.5 months  Needs refills.  At today's visit, she was 15 minutes late out of her 20-minute appointment.  We did not have time to address her diabetes and I advised her to schedule another appointment so we can talk about this.  In particular, she is interested in starting [facility] and would like me to prescribe it.  Component     Latest Ref Rng & Units 03/15/2021  Thyroglobulin     ng/mL 0.1 (L)  Comment        T4,Free(Direct)     0.60 - 1.60 ng/dL 1.18  TSH     0.35 - 5.50 uIU/mL 0.31 (L)  Thyroglobulin Ab     < or = 1 IU/mL <1  Thyroglobulin is barely detectable.  TSH is a little low.  We will try to decrease the levothyroxine dose to 150 mcg daily and recheck her TFTs in 1.5 months.  Philemon Kingdom, MD PhD [facility]

## 2021-03-15 NOTE — Patient Instructions (Signed)
Please continue Levothyroxine 175 mcg daily.  Take the thyroid hormone every day, with water, at least 30 minutes before breakfast, separated by at least 4 hours from: - acid reflux medications - calcium - iron - multivitamins  Please stop at the lab.  Please come back for a follow-up appointment in 1 year.

## 2021-03-16 LAB — THYROGLOBULIN LEVEL: Thyroglobulin: 0.1 ng/mL — ABNORMAL LOW

## 2021-03-16 LAB — THYROGLOBULIN ANTIBODY: Thyroglobulin Ab: <1 IU/mL (ref <=1)

## 2021-03-16 MED ORDER — LEVOTHYROXINE SODIUM 150 MCG PO TABS: 150.0000 ug | ORAL_TABLET | Freq: Every day | ORAL | 3 refills | Status: DC

## 2021-03-25 ENCOUNTER — Encounter: Payer: Self-pay | Admitting: Family

## 2021-03-25 ENCOUNTER — Other Ambulatory Visit: Payer: Self-pay | Admitting: Pulmonary Disease

## 2021-03-27 MED ORDER — AMLODIPINE BESYLATE 5 MG PO TABS: 5.0000 mg | ORAL_TABLET | Freq: Every day | ORAL | 1 refills | Status: DC

## 2021-03-31 ENCOUNTER — Telehealth: Payer: Self-pay

## 2021-03-31 ENCOUNTER — Telehealth: Payer: Self-pay | Admitting: Physical Medicine and Rehabilitation

## 2021-03-31 DIAGNOSIS — R002 Palpitations: Secondary | ICD-10-CM

## 2021-03-31 DIAGNOSIS — I10 Essential (primary) hypertension: Secondary | ICD-10-CM

## 2021-03-31 DIAGNOSIS — Z0000 Encounter for general adult medical examination without abnormal findings: Secondary | ICD-10-CM

## 2021-03-31 DIAGNOSIS — Z8249 Family history of ischemic heart disease and other diseases of the circulatory system: Secondary | ICD-10-CM

## 2021-03-31 NOTE — Telephone Encounter (Signed)
Patient called back, gave fax number for [facility] [phone], [place]. Number [id number], claim numbers [id number] & [id number]. I faxed 12/01/20 & 12/22/20 OV notes to [facility] indicated acct number and claim numbers.

## 2021-03-31 NOTE — Telephone Encounter (Signed)
Received call from patient stating her insurance was to have sent a request for records for the injections she got as she is getting billed. I advised we haven't gotten request from [facility]. She is going to call them back and get a fax number and a reference number and  call me back and I will fax records.

## 2021-03-31 NOTE — Telephone Encounter (Signed)
Per Dr. Harrington Challenger, Pt to have a Lipomed and calcium Score... requested by the pt as we also see her husband... Michelle Kennedy.

## 2021-04-01 ENCOUNTER — Telehealth: Payer: Self-pay | Admitting: Physical Medicine and Rehabilitation

## 2021-04-01 NOTE — Telephone Encounter (Signed)
Received vm from patient with 2 different fax numbers for [facility] [phone] & [phone]. I faxed Dr. Kennon Portela notes with Acct number given by pt [id number] and claim numbers [id number] & [id number]

## 2021-04-03 ENCOUNTER — Other Ambulatory Visit: Payer: Self-pay | Admitting: Family

## 2021-04-04 ENCOUNTER — Telehealth: Payer: Self-pay | Admitting: Physical Medicine and Rehabilitation

## 2021-04-04 NOTE — Telephone Encounter (Signed)
Received call from pt. She wanted update as to what's going on and wants to pay on a bill. I advised patient and husband (on speaker phone) that I refaxed the notes to the fax number she requested on 11/11. As far as paying on an acct, I advised needs to contact billing dept as I can only help him HIM needs.

## 2021-04-08 ENCOUNTER — Telehealth: Payer: Self-pay | Admitting: Emergency Medicine

## 2021-04-08 NOTE — Telephone Encounter (Signed)
[id number] - TREATMENT OF ESTABLISHED CHEMOTHERAPY-INDUCED NEUROPATHY WITH N-PALMITOYLETHANOLAMIDE, A CANNABIMIMETIC NUTRACEUTICAL: A RANDOMIZED DOUBLE-BLIND PHASE II PILOT TRIAL  Contacted pt to f/u on potential re-interest in trial, no answer.  Left VM with contact information.  Will f/u again soon.  Wells Guiles 'Learta CoddingNeysa Bonito, RN, BSN Clinical Research Nurse I 04/08/21 11:00 AM

## 2021-04-15 ENCOUNTER — Telehealth: Payer: Self-pay | Admitting: Emergency Medicine

## 2021-04-15 NOTE — Telephone Encounter (Signed)
ACCRU-[id number] - TREATMENT OF ESTABLISHED CHEMOTHERAPY-INDUCED NEUROPATHY WITH N-PALMITOYLETHANOLAMIDE, A CANNABIMIMETIC NUTRACEUTICAL: A RANDOMIZED DOUBLE-BLIND PHASE II PILOT TRIAL  Attempted to contact pt to f/u on study interest, no answer.  Left VM with contact info requesting call back if interested.  Will attempt to contact again next week.  Wells Guiles 'Learta CoddingNeysa Bonito, RN, BSN Clinical Research Nurse I 04/15/21 12:49 PM

## 2021-04-21 ENCOUNTER — Telehealth: Payer: Self-pay | Admitting: Emergency Medicine

## 2021-04-21 ENCOUNTER — Encounter: Payer: Self-pay | Admitting: Internal Medicine

## 2021-04-21 NOTE — Telephone Encounter (Signed)
ACCRU-[place]-[id number] - TREATMENT OF ESTABLISHED CHEMOTHERAPY-INDUCED NEUROPATHY WITH N-PALMITOYLETHANOLAMIDE, A CANNABIMIMETIC NUTRACEUTICAL: A RANDOMIZED DOUBLE-BLIND PHASE II PILOT TRIAL  Attempted to contact pt to f/u on interest in study, no answer.  Left VM with request to f/u as needed with any questions/concerns or if interested in study.  Pt will not be enrolled on study at this time.  Wells Guiles 'Learta CoddingNeysa Bonito, RN, BSN Clinical Research Nurse I 04/21/21 1:15 PM

## 2021-04-22 ENCOUNTER — Encounter: Payer: Self-pay | Admitting: Internal Medicine

## 2021-04-22 ENCOUNTER — Ambulatory Visit (INDEPENDENT_AMBULATORY_CARE_PROVIDER_SITE_OTHER): Payer: 59 | Admitting: Internal Medicine

## 2021-04-22 ENCOUNTER — Other Ambulatory Visit: Payer: Self-pay

## 2021-04-22 VITALS — BP 120/74 | HR 73 | Ht 67.0 in | Wt 286.8 lb

## 2021-04-22 DIAGNOSIS — E890 Postprocedural hypothyroidism: Secondary | ICD-10-CM | POA: Diagnosis not present

## 2021-04-22 DIAGNOSIS — R7303 Prediabetes: Secondary | ICD-10-CM | POA: Diagnosis not present

## 2021-04-22 DIAGNOSIS — C73 Malignant neoplasm of thyroid gland: Secondary | ICD-10-CM

## 2021-04-22 LAB — POCT GLYCOSYLATED HEMOGLOBIN (HGB A1C): Hemoglobin A1C: 6 % — AB (ref 4.0–5.6)

## 2021-04-22 MED ORDER — METFORMIN HCL 500 MG PO TABS: 500.0000 mg | ORAL_TABLET | Freq: Two times a day (BID) | ORAL | 3 refills | Status: DC

## 2021-04-22 NOTE — Addendum Note (Signed)
Addended by: Lauralyn Primes on: 04/22/2021 02:25 PM   Modules accepted: Orders

## 2021-04-22 NOTE — Progress Notes (Signed)
Patient ID: Michelle Kennedy, female   DOB: 02-Dec-1955, 65 y.o.   MRN: [id number]   This visit occurred during the SARS-CoV-2 public health emergency.  Safety protocols were in place, including screening questions prior to the visit, additional usage of staff PPE, and extensive cleaning of exam room while observing appropriate contact time as indicated for disinfecting solutions.   HPI  Michelle Kennedy is a 65 y.o.-year-old female, returning for follow-up for h/o papillary thyroid cancer and postsurgical hypothyroidism. She saw Dr. Iran Planas before. Last visit with me 1.5 months ago.  She would also want me to address her prediabetes at today's visit.  She is here with her husband who offers part of the history, especially about medication doses, diet, past medical history.  Interim history: No  blurry vision, nausea, chest pain.  She mentions back pain after bending over several days ago. She has increased urination, thirst, delayed healing. She gained ~40s in last year.  Thyroid cancer: Reviewed history: Pt. has been dx with ThyCA in 2002, and had total thyroidectomy then [facility]). No RAI tx 2/2 severe iodine allergy reportedly, no postop U/S's checked.  Neck U/S (12/2016): Complete thyroidectomy without significant abnormality by ultrasound  Thyroglobulin levels are low, but detectable: Thyroglobulin levels are low, but detectable: Lab Results  Component Value Date   THYROGLB 0.1 (L) 03/15/2021   THYROGLB 0.1 (L) 12/09/2019   THYROGLB 0.3 (L) 07/15/2019   THYROGLB 0.1 (L) 09/17/2017   THYROGLB 0.1 (L) 12/12/2016   Lab Results  Component Value Date   THGAB <1 03/15/2021   THGAB <1 12/09/2019   THGAB <1 07/15/2019   THGAB <1 09/17/2017   THGAB <1 12/12/2016  10/11/2015: Tg <0.1, ATA <1  Pt denies: - feeling nodules in neck - hoarseness - dysphagia - choking - SOB with lying down  Postsurgical hypothyroidism: She was previously on levothyroxine 200 mcg daily  >> decreased to 175 mcg daily >> at last visit, decreased to 150 mcg daily, but she forgot....  She takes the levothyroxine: - in am - fasting - at least 30 min from b'fast -+ Calcium at night - 1-2 Tums at bedtime 2x a week - + MVI at night - no PPIs at night - not on Biotin - on vitamin D   Reviewed patient's TFTs: Lab Results  Component Value Date   TSH 0.31 (L) 03/15/2021   TSH 0.65 03/01/2020   TSH 0.41 12/09/2019   TSH 0.20 (L) 07/15/2019   TSH 2.01 07/05/2018   TSH 6.40 (H) 05/30/2018   TSH 14.60 (H) 05/01/2018   TSH 0.82 09/17/2017   TSH 2.91 03/20/2017   TSH 0.84 12/12/2016   FREET4 1.18 03/15/2021   FREET4 1.13 12/09/2019   FREET4 1.14 07/15/2019   FREET4 1.12 09/17/2017   FREET4 0.87 03/20/2017   FREET4 0.90 12/12/2016   FREET4 1.16 09/20/2016   Prev. Very suppressed TSH levels per review of Care Everywhere records.  She has + FH of thyroid disorders in: siblings: goiter. + FH of thyroid cancer: Paternal aunts and uncle. No history of radiation therapy to head or neck . Had RxTx for BrCA (diagnosed in 2016)-but not to the neck.  Prediabetes:  Reviewed HbA1c: Lab Results  Component Value Date   HGBA1C 6.2 10/26/2020   HGBA1C 5.8 (H) 03/01/2020   HGBA1C 6.1 (H) 08/23/2019   HGBA1C 5.9 02/26/2018   HGBA1C 5.7 (H) 01/23/2017   HGBA1C 5.6 09/20/2016   HGBA1C 5.7 (H) 04/10/2016   HGBA1C 5.9 (  H) 10/25/2015   HGBA1C 5.7 (H) 05/10/2015   HGBA1C 5.8 (H) 12/17/2014   Patient's prediabetes is diet controlled.  Pt checks her sugars 0-1x (when feeling sick) a day and they are: - am: 60s, 70s-84 - 2h after b'fast: up to 180s - before lunch: n/c - 2h after lunch: n/c - before dinner: n/c - 2h after dinner: up to 210 - bedtime: n/c - nighttime: n/c Lowest sugar was 60s; she has hypoglycemia awareness at 70.  Highest sugar was 200s.  Glucometer: Auvon  Pt's meals are: - Breakfast:  oatmeal + berries; egg mcmuffin + sausage + cheese; scrambled eggs +  sausage + potatoes + peppers - Lunch: tomato soup, mac and cheese - Dinner: same  - Snacks: almonds, berry pie No sweet drinks. She saw a dietitian - 2 months ago.  - no CKD, last BUN/creatinine:  Lab Results  Component Value Date   BUN 12 06/28/2020   BUN 13 03/15/2020   CREATININE 0.83 06/28/2020   CREATININE 0.87 03/15/2020  On lisinopril 20 mg daily.  - + HL; last set of lipids: Lab Results  Component Value Date   CHOL 164 06/28/2020   HDL 55.10 06/28/2020   LDLCALC 77 06/28/2020   LDLDIRECT 117.0 02/26/2019   TRIG 157.0 (H) 06/28/2020   CHOLHDL 3 06/28/2020  On atorvastatin 40 mg daily.  - last eye exam was on 02/08/2021: No DR, + open-angle glaucoma, mild cataract right eye, + astigmatism.  - + numbness and tingling in her feet.  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  She is in a study for chemotherapy-induced neuropathy.  She is on Neurontin 200 mg 3 times a day.  Pt has FH of DM in M, B, PGM.  She was seeing Dr. Dennard Nip in the [facility] >> stopped She has fibromyalgia, RA, HTN, asthma.  She is on anastrozole for h/o breast cancer.  ROS: + See HPI  I reviewed pt's medications, allergies, PMH, social hx, family hx, and changes were documented in the history of present illness. Otherwise, unchanged from my initial visit note.  Past Medical History:  Diagnosis Date   Abnormally small mouth    Achilles tendon disorder, right    Anemia    Anxiety    Arthritis    hips and knees   Asthma    daily inhaler, prn inhaler and neb.   Asthma due to environmental allergies    Breast cancer ([place]) 01/2014   left   Chest pain    COPD (chronic obstructive pulmonary disease) (HCC)    Dental bridge present    upper front and lower right   Dental crowns present    x 3   Depression    Esophageal spasm    reports since her chemo and breast surgery  she developed esophageal spasms and reports this is in the past has casue her 02 to  desat in the  70s , denies sycnope in relation to this , does report hx of vertigo as well    Family history of anesthesia complication    twin brother aspirated and died on OR table, per pt.   Fibromyalgia    Gallbladder disease    Gallstones    GERD (gastroesophageal reflux disease)    Glaucoma    H/O blood clots    had blood to  PICC line    History of gastric ulcer    as a teenager   History of seizure age 89  as a reaction to Penicillin - no seizures since   History of stomach ulcers    History of thyroid cancer    s/p thyroidectomy   HTN (hypertension)    Hyperlipidemia    Hypothyroidism    Joint pain    Left knee injury    Liver disorder    seen when she had her hysterectomy , reports she was told it was  " small lesion" ; but asymptomatic    Lymphedema    left arm   Migraines    Multiple food allergies    Obesity    OSA (obstructive sleep apnea) 02/16/2016   CPAP   Palpitations    reports no longer experiences   Personal history of chemotherapy 2015   Personal history of radiation therapy 2015   Left   Prediabetes    Rheumatoid arthritis (HCC)    SOB (shortness of breath)    Swelling of both ankles    Tinnitus    UTI (lower urinary tract infection) 02/17/2014   Vertigo    last episode  was 2 weeks ago    Vitamin D deficiency    Wears contact lenses    left eye only    Wears hearing aid in both ears    Past Surgical History:  Procedure Laterality Date   ABDOMINAL HYSTERECTOMY  2004   complete   ACHILLES TENDON REPAIR Right    APPENDECTOMY  2004   BREAST EXCISIONAL BIOPSY     BREAST LUMPECTOMY Left    2015   BREAST LUMPECTOMY WITH NEEDLE LOCALIZATION AND AXILLARY SENTINEL LYMPH NODE BX Left 02/23/2014   Procedure: BREAST LUMPECTOMY WITH NEEDLE LOCALIZATION AND AXILLARY SENTINEL LYMPH NODE BIOPSY;  Surgeon: Excell Seltzer, MD;  Location: [facility];  Service: General;  Laterality: Left;   BREAST SURGERY  2011   left breast biposy    CHOLECYSTECTOMY  1990   COLONOSCOPY W/ POLYPECTOMY  06/2009   EYE SURGERY Right 2013   exc. warts from underneath eyelid   INCONTINENCE SURGERY  2004   KNEE ARTHROSCOPY Bilateral    x 6 each knee   LIGAMENT REPAIR Right    thumb/wrist   ORIF TOE FRACTURE Right    great toe   PORTACATH PLACEMENT N/A 03/24/2014   Procedure: INSERTION PORT-A-CATH;  Surgeon: Excell Seltzer, MD;  Location: [facility];  Service: General;  Laterality: N/A;; removed    THYROIDECTOMY  2000   TONSILLECTOMY AND ADENOIDECTOMY  2000   TOTAL KNEE ARTHROPLASTY Left 12/03/2017   Procedure: LEFT TOTAL KNEE ARTHROPLASTY;  Surgeon: Gaynelle Arabian, MD;  Location: [facility];  Service: Orthopedics;  Laterality: Left;   TUMOR EXCISION     from thoracic spine   Social History   Social History   Marital status: Married    Spouse name: N/A   Number of children: 2   Occupational History   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 40 mL 3   albuterol (VENTOLIN HFA) 108 (90 Base) MCG/ACT inhaler USE 2 INHALATIONS ORALLY  EVERY 6 HOURS AS NEEDED FORWHEEZING OR SHORTNESS OF   BREATH. 3 each 1   ALPHAGAN P 0.1 % SOLN Place 1 drop into both eyes in the morning and at bedtime.      amLODipine (NORVASC) 5 MG tablet Take 1 tablet (5 mg total) by mouth daily. 90 tablet 1   anastrozole (ARIMIDEX) 1 MG tablet Take 1 tablet (1 mg total) by mouth daily. 90 tablet 3   atorvastatin (LIPITOR) 40 MG tablet Take 1 tablet (40 mg total) by mouth daily. 90 tablet 0   CALCIUM PO Take 1 tablet by mouth every evening.      cholecalciferol (VITAMIN D3) 25 MCG (1000 UNIT) tablet Take 1,000 Units by mouth every evening.      clopidogrel (PLAVIX) 75 MG tablet TAKE 1 TABLET BY MOUTH     DAILY 90 tablet 1   ergocalciferol (VITAMIN D2) 1.25 MG (50000 UT) capsule Take 1 capsule by mouth daily.     furosemide (LASIX) 20 MG tablet TAKE 1 TABLET(20 MG) BY MOUTH DAILY AS NEEDED FOR SWELLING 30 tablet 3   gabapentin (NEURONTIN) 100 MG capsule Take 2 capsules (200 mg total) by mouth 3 (three) times daily. 540 capsule 1   GEMTESA 75 MG TABS Take 1 tablet by mouth daily.     latanoprost (XALATAN) 0.005 % ophthalmic solution 1 drop at bedtime.     levothyroxine (SYNTHROID) 150 MCG tablet Take 1 tablet (150 mcg total) by mouth daily. 45 tablet 3   lisinopril (ZESTRIL) 20 MG tablet TAKE 2 TABLETS DAILY 135 tablet 1   loratadine-pseudoephedrine (CLARITIN-D 24-HOUR) 10-240 MG 24 hr tablet Take 1 tablet by mouth daily.     meclizine (ANTIVERT) 25 MG tablet Take 1 tablet (25 mg total) by mouth 3 (three) times daily as needed for dizziness. 30 tablet 0   methocarbamol (ROBAXIN) 500 MG tablet TAKE 1 TABLET(500 MG) BY MOUTH EVERY 8 HOURS AS NEEDED FOR MUSCLE SPASMS 60 tablet 1   Multiple Vitamin (MULTIVITAMIN ADULT) TABS Take 1 tablet by mouth daily.     Multiple Vitamins-Minerals (OCUVITE PO) Take 1 tablet by mouth daily.     MYRBETRIQ 50 MG TB24 tablet TAKE 1 TABLET BY MOUTH  DAILY 90 tablet 1   Omega-3 Fatty Acids (FISH OIL) 1000 MG CAPS Take 1 capsule by mouth every evening.      traMADol (ULTRAM) 50 MG tablet Take 1 tablet (50 mg total) by mouth every 6 (six) hours as needed. 30 tablet 0   WIXELA INHUB 500-50 MCG/ACT AEPB INHALE 1 PUFF INTO THE LUNGS TWICE DAILY 60 each 6   XELPROS 0.005 % EMUL Apply 1 drop to eye at bedtime.     No current facility-administered medications on file prior to visit.   Allergies  Allergen Reactions   Bee Venom Anaphylaxis   Codeine Shortness Of Breath and Rash   Contrast Media [Iodinated  Diagnostic Agents] Shortness Of Breath   Gadolinium Derivatives Hives, Rash, Shortness Of Breath and Swelling    "my heart stopped beating"    Iodine Other (See Comments)    CARDIAC ARREST   Latex Anaphylaxis and Rash   Lidocaine Shortness Of Breath and Swelling    SWELLING OF MOUTH AND THROAT, low BP   Penicillins Shortness Of Breath, Rash and Other (See Comments)    SEIZURE Did it involve swelling of the face/tongue/throat, SOB, or low BP? no Did it involve sudden or severe rash/hives, skin peeling, or any reaction on the inside of your  mouth or nose? yes Did you need to seek medical attention at a hospital or doctor's office? yes When did it last happen?      2010 If all above answers are "NO", may proceed with cephalosporin use.    Pentazocine Lactate Shortness Of Breath and Rash   Shellfish Allergy Shortness Of Breath and Rash   Aspirin Rash and Other (See Comments)    GI UPSET   Erythromycin Swelling and Rash    SWELLING OF JOINTS   Symbicort [Budesonide-Formoterol Fumarate] Other (See Comments)    BURNING OF TONGUE AND LIPS   Betadine [Povidone Iodine]     Rash. Breathing problems.    Compazine [Prochlorperazine Maleate] Rash    Rash on face,chest, arms, back   Sulfonamide Derivatives Rash   Family History  Problem Relation Age of Onset   Alcohol abuse Mother    Arthritis Mother    Hypertension Mother    Bipolar disorder Mother    Breast cancer Mother 51       unconfirmed   Lung cancer Mother 57       smoker   Hyperlipidemia Mother    Stroke Mother    Cancer Mother    Depression Mother    Anxiety disorder Mother    Alcoholism Mother    Drug abuse Mother    Eating disorder Mother    Obesity Mother    Alcohol abuse Father    Hyperlipidemia Father    Kidney disease Father    Diabetes Father    Hypertension Father    Cystic kidney disease Father    Thyroid disease Father    Liver disease Father    Alcoholism Father    Lung cancer Father    Thyroid  cancer Sister 55       type?; currently 50   Other Sister        ovarian tumor @ 70; TAH/BSO   Breast cancer Sister    Thyroid cancer Brother        dx 67s; currently 60   Breast cancer Maternal Aunt        dx 52s; deceased 78   Thyroid cancer Paternal 73        All 3 paternal aunts with thyroid ca in 30s/40s   Lung cancer Paternal Aunt        2 of 3 paternal aunts with lung cancer   Arthritis Maternal Grandmother    Diabetes Paternal Grandmother    Heart disease Other    COPD Other    Asthma Other     PE: BP 120/74 (BP Location: Right Arm, Patient Position: Sitting, Cuff Size: Normal)   Pulse 73   Ht _0  (1.702 m)   Wt 286 lb 12.8 oz (130.1 kg)   SpO2 97%   BMI 44.92 kg/m  Wt Readings from Last 3 Encounters:  04/22/21 286 lb 12.8 oz (130.1 kg)  03/15/21 284 lb 3.2 oz (128.9 kg)  01/26/21 286 lb (129.7 kg)   Constitutional: overweight, in NAD Eyes: PERRLA, EOMI, no exophthalmos ENT: moist mucous membranes, no neck masses, no cervical lymphadenopathy Cardiovascular: RRR, No MRG Respiratory: CTA B Musculoskeletal: no deformities, strength intact in all 4 Skin: moist, warm, no rashes Neurological: + slight tremor with outstretched L hand, DTR normal in all 4  ASSESSMENT: 1. H/O PTC  2. Hypothyroidism  3.  Prediabetes - R eye stroke 2021  She also has dCHF but unlikely as a complication of her prediabetes.  PLAN:  1. PTC -  No detailed records; she did not have RAI treatment because of allergy to iodine -Previous thyroglobulin levels were undetectable, as were her ATA antibodies per records reviewed from Dr. Tamala Julian.  Thyroglobulin increased afterwards, with thelevel being 0.3 in 06/2019, at last visit.  We discussed that this is not uncommon due to assay variability and also due to the fact that she did not have RAI treatment.   -We repeated her thyroglobulin in 11/2019 and this decreased to 0.1. -At last visit, thyroglobulin was still only barely detectable,  0.1, while her antithyroglobulin Antibodies were undetectable -We reviewed the latest neck ultrasound report from 12/2017: No metastases or recurrences in the neck  2. Patient with longstanding, postsurgical hypothyroidism, on levothyroxine treatment -  at last visit, TSH is low so I advised him to decrease the dose of levothyroxine: Lab Results  Component Value Date   TSH 0.31 (L) 03/15/2021  - she continues on LT4 175 mcg daily as she forgot to decrease it to 150 mcg as advised at last visit - pt feels good on this dose. - we discussed about taking the thyroid hormone every day, with water, >30 minutes before breakfast, separated by >4 hours from acid reflux medications, calcium, iron, multivitamins. Pt. is taking it correctly. -At this visit we discussed about decreasing the dose to 150 and coming back for labs in 1.5 months.  3.  Prediabetes - addressed at her request - She has longstanding, diet-controlled prediabetes.  - Latest HbA1c was reviewed from 10/2020 and this was higher, at 6.2%.  She is very worried about this and is interested in starting medication to reduce her insulin resistance. -At today's visit, HbA1c was lower, at 6.0% -She checks her blood sugars at home occasionally, especially when feeling poorly and they are at goal in the morning, occasionally in the 60s, but increased to 200s after dinner. -At this visit, I did suggest to start metformin and I explained the mechanism of action and possible side effects.  We will start at a low dose and I advised her to increase to the half maximal dose if tolerated well -We also discussed about the concept of insulin resistance, causative factors and ways to improve it.  We discussed about diet and I suggested several changes to reduce the fat content - advised her to start checking sugars at different times of the day - check 1x a day, rotating checks - discussed about CBG targets for treatment of diabetes and prediabetes - given  sugar log and advised how to fill it and to bring it at next appt  - given foot care handout and explained the principles  - given instructions for hypoglycemia management "15-15 rule"  - advised to check sugars at different times of the day - 1x a day, rotating check times - advised for yearly eye exams >> she is UTD - return to clinic in 4 months  Patient Instructions  Please start Metformin 500 mg with dinner x 4 days. If you tolerate this well, add another Metformin tablet (500 mg) with breakfast. Continue 2x a day.  Please decrease levothyroxine to 150 mcg daily.  Take the thyroid hormone every day, with water, at least 30 minutes before breakfast, separated by at least 4 hours from: - acid reflux medications - calcium - iron - multivitamins  Please stop at the lab.  Please return in 4 months.  - Total time spent for the visit: 40 minutes, in obtaining medical information from the chart, and also from  the patient and her husband, reviewing her  previous labs, imaging evaluations, and treatments, reviewing her symptoms, counseling her about her condition (please see the discussed topics above), and developing a plan to further investigate and treat them; they had a number of questions which I addressed.    Philemon Kingdom, MD PhD [facility]

## 2021-04-22 NOTE — Patient Instructions (Addendum)
Please start Metformin 500 mg with dinner x 4 days. If you tolerate this well, add another Metformin tablet (500 mg) with breakfast. Continue 2x a day.  Please decrease levothyroxine to 150 mcg daily.  Take the thyroid hormone every day, with water, at least 30 minutes before breakfast, separated by at least 4 hours from: - acid reflux medications - calcium - iron - multivitamins  Please stop at the lab.  Please return in 4 months.  PATIENT INSTRUCTIONS FOR TYPE 2 DIABETES:  DIET AND EXERCISE Diet and exercise is an important part of diabetic treatment.  We recommended aerobic exercise in the form of brisk walking (working between 40-60% of maximal aerobic capacity, similar to brisk walking) for 150 minutes per week (such as 30 minutes five days per week) along with 3 times per week performing 'resistance' training (using various gauge rubber tubes with handles) 5-10 exercises involving the major muscle groups (upper body, lower body and core) performing 10-15 repetitions (or near fatigue) each exercise. Start at half the above goal but build slowly to reach the above goals. If limited by weight, joint pain, or disability, we recommend daily walking in a swimming pool with water up to waist to reduce pressure from joints while allow for adequate exercise.    BLOOD GLUCOSES Monitoring your blood glucoses is important for continued management of your diabetes. Please check your blood glucoses 2-4 times a day: fasting, before meals and at bedtime (you can rotate these measurements - e.g. one day check before the 3 meals, the next day check before 2 of the meals and before bedtime, etc.).   HYPOGLYCEMIA (low blood sugar) Hypoglycemia is usually a reaction to not eating, exercising, or taking too much insulin/ other diabetes drugs.  Symptoms include tremors, sweating, hunger, confusion, headache, etc. Treat IMMEDIATELY with 15 grams of Carbs: 4 glucose tablets  cup regular juice/soda 2  tablespoons raisins 4 teaspoons sugar 1 tablespoon honey Recheck blood glucose in 15 mins and repeat above if still symptomatic/blood glucose <100.  RECOMMENDATIONS TO REDUCE YOUR RISK OF DIABETIC COMPLICATIONS: * Take your prescribed MEDICATION(S) * Follow a DIABETIC diet: Complex carbs, fiber rich foods, (monounsaturated and polyunsaturated) fats * AVOID saturated/trans fats, high fat foods, >2,300 mg salt per day. * EXERCISE at least 5 times a week for 30 minutes or preferably daily.  * DO NOT SMOKE OR DRINK more than 1 drink a day. * Check your FEET every day. Do not wear tightfitting shoes. Contact us if you develop an ulcer * See your EYE doctor once a year or more if needed * Get a FLU shot once a year * Get a PNEUMONIA vaccine once before and once after age 27 years  GOALS:  * Your Hemoglobin A1c of <7%  * fasting sugars need to be <130 * after meals sugars need to be <180 (2h after you start eating) * Your Systolic BP should be 035 or lower  * Your Diastolic BP should be 80 or lower  * Your HDL (Good Cholesterol) should be 40 or higher  * Your LDL (Bad Cholesterol) should be 100 or lower. * Your Triglycerides should be 150 or lower  * Your Urine microalbumin (kidney function) should be <30 * Your Body Mass Index should be 25 or lower    Please consider the following ways to cut down carbs and fat and increase fiber and micronutrients in your diet: - substitute whole grain for white bread or pasta - substitute brown rice for white  rice - substitute 90-calorie flat bread pieces for slices of bread when possible - substitute sweet potatoes or yams for white potatoes - substitute humus for margarine - substitute tofu for cheese when possible - substitute almond or rice milk for regular milk (would not drink soy milk daily due to concern for soy estrogen influence on breast cancer risk) - substitute dark chocolate for other sweets when possible - substitute water - can add  lemon or orange slices for taste - for diet sodas (artificial sweeteners will trick your body that you can eat sweets without getting calories and will lead you to overeating and weight gain in the long run) - do not skip breakfast or other meals (this will slow down the metabolism and will result in more weight gain over time)  - can try smoothies made from fruit and almond/rice milk in am instead of regular breakfast - can also try old-fashioned (not instant) oatmeal made with almond/rice milk in am - order the dressing on the side when eating salad at a restaurant (pour less than half of the dressing on the salad) - eat as little meat as possible - can try juicing, but should not forget that juicing will get rid of the fiber, so would alternate with eating raw veg./fruits or drinking smoothies - use as little oil as possible, even when using olive oil - can dress a salad with a mix of balsamic vinegar and lemon juice, for e.g. - use agave nectar, stevia sugar, or regular sugar rather than artificial sweateners - steam or broil/roast veggies  - snack on veggies/fruit/nuts (unsalted, preferably) when possible, rather than processed foods - reduce or eliminate aspartame in diet (it is in diet sodas, chewing gum, etc) Read the labels!  Try to read Dr. Janene Harvey book: "Program for Reversing Diabetes" for other ideas for healthy eating.

## 2021-04-26 ENCOUNTER — Encounter: Payer: Self-pay | Admitting: Internal Medicine

## 2021-04-27 ENCOUNTER — Ambulatory Visit (INDEPENDENT_AMBULATORY_CARE_PROVIDER_SITE_OTHER): Payer: 59 | Admitting: Family

## 2021-04-27 VITALS — BP 144/75 | HR 65 | Temp 98.0°F | Resp 16 | Ht 66.0 in | Wt 275.6 lb

## 2021-04-27 DIAGNOSIS — M5442 Lumbago with sciatica, left side: Secondary | ICD-10-CM

## 2021-04-27 DIAGNOSIS — J4540 Moderate persistent asthma, uncomplicated: Secondary | ICD-10-CM

## 2021-04-27 DIAGNOSIS — I10 Essential (primary) hypertension: Secondary | ICD-10-CM

## 2021-04-27 DIAGNOSIS — E782 Mixed hyperlipidemia: Secondary | ICD-10-CM

## 2021-04-27 DIAGNOSIS — I5032 Chronic diastolic (congestive) heart failure: Secondary | ICD-10-CM | POA: Diagnosis not present

## 2021-04-27 DIAGNOSIS — G8929 Other chronic pain: Secondary | ICD-10-CM | POA: Insufficient documentation

## 2021-04-27 LAB — COMPREHENSIVE METABOLIC PANEL
ALT: 22 U/L (ref 0–35)
AST: 16 U/L (ref 0–37)
Albumin: 4.1 g/dL (ref 3.5–5.2)
Alkaline Phosphatase: 79 U/L (ref 39–117)
BUN: 14 mg/dL (ref 6–23)
CO2: 30 mEq/L (ref 19–32)
Calcium: 10.1 mg/dL (ref 8.4–10.5)
Chloride: 104 mEq/L (ref 96–112)
Creatinine, Ser: 0.78 mg/dL (ref 0.40–1.20)
GFR: 78.91 mL/min (ref >60.00)
Glucose, Bld: 94 mg/dL (ref 70–99)
Potassium: 4.2 mEq/L (ref 3.5–5.1)
Sodium: 141 mEq/L (ref 135–145)
Total Bilirubin: 0.5 mg/dL (ref 0.2–1.2)
Total Protein: 6.6 g/dL (ref 6.0–8.3)

## 2021-04-27 MED ORDER — TRAMADOL HCL 50 MG PO TABS: 50.0000 mg | ORAL_TABLET | Freq: Four times a day (QID) | ORAL | 0 refills | Status: DC | PRN

## 2021-04-27 MED ORDER — METHOCARBAMOL 500 MG PO TABS: ORAL_TABLET | ORAL | 0 refills | Status: DC

## 2021-04-27 NOTE — Assessment & Plan Note (Signed)
BP Readings from Last 3 Encounters:  04/27/21 (!) 150/80  04/22/21 120/74  03/15/21 128/79   Elevated due to back pain. Continue amlodipine 5mg .

## 2021-04-27 NOTE — Progress Notes (Addendum)
Subjective:   By signing my name below, I, Shehryar Baig, attest that this documentation has been prepared under the direction and in the presence of Debbrah Alar NP. 04/27/2021    Patient ID: Michelle Kennedy, female    DOB: [birth year], 66 y.o.   MRN: [id number]  Chief Complaint  Patient presents with   Follow-up    Here for Follow Up     HPI Patient is in today for a office visit.   Back pain- She complains of back pain for the past 3 days. She reports bending down to pick up something and felt she pulled a muscle in her back. She was taking 500 mg robaxin PRN to manage her pain but reports running out of it. She is requesting a refill on it it.  Blood sugars- She was recently given 500 mg metformin 2x daily PO to manage her blood sugars and reports they have stabilized. She is experiencing diarrhea while taking it.   Lab Results  Component Value Date   HGBA1C 6.0 (A) 04/22/2021   Blood pressure- Her blood pressure is elevated during this visit and thinks it may be due to her back pain. She continues taking 5 mg amlodipine daily PO and reports no new issues while taking it.   BP Readings from Last 3 Encounters:  04/27/21 (!) 144/75  04/22/21 120/74  03/15/21 128/79   Pulse Readings from Last 3 Encounters:  04/27/21 65  04/22/21 73  03/15/21 87   Tramadol- She is requesting a refill on her 50 mg tramadol PRN. She continues seeing Dr. Arlice Colt regularly and is planning on making another appointment with them.  Asthma- She is managing her asthma well at this time while using wixela inhalers. She has not had to use albuterol recently.  Immunizations- She is UTD on flu vaccines. She is UTD on shingles vaccines.  Weight- She has lost weight since her last visit. She is improving her diet and incorporating exercise in her daily routine. She is also seeing a dietician regularly.   Wt Readings from Last 3 Encounters:  04/27/21 275 lb 9.6 oz (125 kg)  04/22/21 286 lb 12.8 oz  (130.1 kg)  03/15/21 284 lb 3.2 oz (128.9 kg)    There are no preventive care reminders to display for this patient.   Past Medical History:  Diagnosis Date   Abnormally small mouth    Achilles tendon disorder, right    Anemia    Anxiety    Arthritis    hips and knees   Asthma    daily inhaler, prn inhaler and neb.   Asthma due to environmental allergies    Breast cancer ([facility]) 01/2014   left   Chest pain    COPD (chronic obstructive pulmonary disease) (HCC)    Dental bridge present    upper front and lower right   Dental crowns present    x 3   Depression    Esophageal spasm    reports since her chemo and breast surgery  she developed esophageal spasms and reports this is in the past has casue her 02 to desat in the  70s , denies sycnope in relation to this , does report hx of vertigo as well    Family history of anesthesia complication    twin brother aspirated and died on OR table, per pt.   Fibromyalgia    Gallbladder disease    Gallstones    GERD (gastroesophageal reflux disease)    Glaucoma  H/O blood clots    had blood to  PICC line    History of gastric ulcer    as a teenager   History of seizure age 73   as a reaction to Penicillin - no seizures since   History of stomach ulcers    History of thyroid cancer    s/p thyroidectomy   HTN (hypertension)    Hyperlipidemia    Hypothyroidism    Joint pain    Left knee injury    Liver disorder    seen when she had her hysterectomy , reports she was told it was  " small lesion" ; but asymptomatic    Lymphedema    left arm   Migraines    Multiple food allergies    Obesity    OSA (obstructive sleep apnea) 02/16/2016   CPAP   Palpitations    reports no longer experiences   Personal history of chemotherapy 2015   Personal history of radiation therapy 2015   Left   Prediabetes    Rheumatoid arthritis (HCC)    SOB (shortness of breath)    Swelling of both ankles    Tinnitus    UTI (lower urinary tract  infection) 02/17/2014   Vertigo    last episode  was 2 weeks ago    Vitamin D deficiency    Wears contact lenses    left eye only    Wears hearing aid in both ears     Past Surgical History:  Procedure Laterality Date   ABDOMINAL HYSTERECTOMY  2004   complete   ACHILLES TENDON REPAIR Right    APPENDECTOMY  2004   BREAST EXCISIONAL BIOPSY     BREAST LUMPECTOMY Left    2015   BREAST LUMPECTOMY WITH NEEDLE LOCALIZATION AND AXILLARY SENTINEL LYMPH NODE BX Left 02/23/2014   Procedure: BREAST LUMPECTOMY WITH NEEDLE LOCALIZATION AND AXILLARY SENTINEL LYMPH NODE BIOPSY;  Surgeon: Excell Seltzer, MD;  Location: [facility];  Service: General;  Laterality: Left;   BREAST SURGERY  2011   left breast biposy   CHOLECYSTECTOMY  1990   COLONOSCOPY W/ POLYPECTOMY  06/2009   EYE SURGERY Right 2013   exc. warts from underneath eyelid   INCONTINENCE SURGERY  2004   KNEE ARTHROSCOPY Bilateral    x 6 each knee   LIGAMENT REPAIR Right    thumb/wrist   ORIF TOE FRACTURE Right    great toe   PORTACATH PLACEMENT N/A 03/24/2014   Procedure: INSERTION PORT-A-CATH;  Surgeon: Excell Seltzer, MD;  Location: [facility];  Service: General;  Laterality: N/A;; removed    THYROIDECTOMY  2000   TONSILLECTOMY AND ADENOIDECTOMY  2000   TOTAL KNEE ARTHROPLASTY Left 12/03/2017   Procedure: LEFT TOTAL KNEE ARTHROPLASTY;  Surgeon: Gaynelle Arabian, MD;  Location: [facility];  Service: Orthopedics;  Laterality: Left;   TUMOR EXCISION     from thoracic spine    Family History  Problem Relation Age of Onset   Alcohol abuse Mother    Arthritis Mother    Hypertension Mother    Bipolar disorder Mother    Breast cancer Mother 21       unconfirmed   Lung cancer Mother 77       smoker   Hyperlipidemia Mother    Stroke Mother    Cancer Mother    Depression Mother    Anxiety disorder Mother    Alcoholism Mother    Drug abuse Mother    Eating disorder  Mother    Obesity Mother     Alcohol abuse Father    Hyperlipidemia Father    Kidney disease Father    Diabetes Father    Hypertension Father    Cystic kidney disease Father    Thyroid disease Father    Liver disease Father    Alcoholism Father    Lung cancer Father    Thyroid cancer Sister 17       type?; currently 10   Other Sister        ovarian tumor @ 76; TAH/BSO   Breast cancer Sister    Thyroid cancer Brother        dx 25s; currently 88   Breast cancer Maternal Aunt        dx 62s; deceased 35   Thyroid cancer Paternal 60        All 3 paternal aunts with thyroid ca in 30s/40s   Lung cancer Paternal Aunt        2 of 3 paternal aunts with lung cancer   Arthritis Maternal Grandmother    Diabetes Paternal Grandmother    Heart disease Other    COPD Other    Asthma Other     Social History   Socioeconomic History   Marital status: Married    Spouse name: Not on file   Number of children: 2   Years of education: Not on file   Highest education level: Not on file  Occupational History   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   Social Determinants of Radio broadcast assistant Strain: Not on file  Food Insecurity: No Food Insecurity   Worried About Charity fundraiser in the Last Year: Never true   Ran Out of Food in the Last Year: Never true  Transportation Needs: Not on file  Physical Activity: Not on file  Stress: Not on file  Social Connections: Not on file  Intimate Partner Violence: Not  on file    Outpatient Medications Prior to Visit  Medication Sig Dispense Refill   albuterol (PROVENTIL) (5 MG/ML) 0.5% nebulizer solution Take 0.5 mLs (2.5 mg total) by nebulization every 6 (six) hours as needed for wheezing or shortness of breath. 40 mL 3   albuterol (VENTOLIN HFA) 108 (90 Base) MCG/ACT inhaler USE 2 INHALATIONS ORALLY   EVERY 6 HOURS AS NEEDED FORWHEEZING OR SHORTNESS OF   BREATH. 3 each 1   ALPHAGAN P 0.1 % SOLN Place 1 drop into both eyes in the morning and at bedtime.      amLODipine (NORVASC) 5 MG tablet Take 1 tablet (5 mg total) by mouth daily. 90 tablet 1   anastrozole (ARIMIDEX) 1 MG tablet Take 1 tablet (1 mg total) by mouth daily. 90 tablet 3   atorvastatin (LIPITOR) 40 MG tablet Take 1 tablet (40 mg total) by mouth daily. 90 tablet  0   CALCIUM PO Take 1 tablet by mouth every evening.     cholecalciferol (VITAMIN D3) 25 MCG (1000 UNIT) tablet Take 1,000 Units by mouth every evening.      clopidogrel (PLAVIX) 75 MG tablet TAKE 1 TABLET BY MOUTH     DAILY 90 tablet 1   ergocalciferol (VITAMIN D2) 1.25 MG (50000 UT) capsule Take 1 capsule by mouth daily.     furosemide (LASIX) 20 MG tablet TAKE 1 TABLET(20 MG) BY MOUTH DAILY AS NEEDED FOR SWELLING 30 tablet 3   gabapentin (NEURONTIN) 100 MG capsule Take 2 capsules (200 mg total) by mouth 3 (three) times daily. 540 capsule 1   GEMTESA 75 MG TABS Take 1 tablet by mouth daily.     latanoprost (XALATAN) 0.005 % ophthalmic solution 1 drop at bedtime.     levothyroxine (SYNTHROID) 150 MCG tablet Take 1 tablet (150 mcg total) by mouth daily. 45 tablet 3   lisinopril (ZESTRIL) 20 MG tablet TAKE 2 TABLETS DAILY 135 tablet 1   loratadine-pseudoephedrine (CLARITIN-D 24-HOUR) 10-240 MG 24 hr tablet Take 1 tablet by mouth daily.     meclizine (ANTIVERT) 25 MG tablet Take 1 tablet (25 mg total) by mouth 3 (three) times daily as needed for dizziness. 30 tablet 0   metFORMIN (GLUCOPHAGE) 500 MG tablet Take 1 tablet (500 mg total) by  mouth 2 (two) times daily with a meal. 180 tablet 3   Multiple Vitamin (MULTIVITAMIN ADULT) TABS Take 1 tablet by mouth daily.     Multiple Vitamins-Minerals (OCUVITE PO) Take 1 tablet by mouth daily.     MYRBETRIQ 50 MG TB24 tablet TAKE 1 TABLET BY MOUTH  DAILY 90 tablet 1   Omega-3 Fatty Acids (FISH OIL) 1000 MG CAPS Take 1 capsule by mouth every evening.      WIXELA INHUB 500-50 MCG/ACT AEPB INHALE 1 PUFF INTO THE LUNGS TWICE DAILY 60 each 6   XELPROS 0.005 % EMUL Apply 1 drop to eye at bedtime.     methocarbamol (ROBAXIN) 500 MG tablet TAKE 1 TABLET(500 MG) BY MOUTH EVERY 8 HOURS AS NEEDED FOR MUSCLE SPASMS 60 tablet 1   traMADol (ULTRAM) 50 MG tablet Take 1 tablet (50 mg total) by mouth every 6 (six) hours as needed. 30 tablet 0   No facility-administered medications prior to visit.    Allergies  Allergen Reactions   Bee Venom Anaphylaxis   Codeine Shortness Of Breath and Rash   Contrast Media [Iodinated Diagnostic Agents] Shortness Of Breath   Gadolinium Derivatives Hives, Rash, Shortness Of Breath and Swelling    "my heart stopped beating"    Iodine Other (See Comments)    CARDIAC ARREST   Latex Anaphylaxis and Rash   Lidocaine Shortness Of Breath and Swelling    SWELLING OF MOUTH AND THROAT, low BP   Penicillins Shortness Of Breath, Rash and Other (See Comments)    SEIZURE Did it involve swelling of the face/tongue/throat, SOB, or low BP? no Did it involve sudden or severe rash/hives, skin peeling, or any reaction on the inside of your mouth or nose? yes Did you need to seek medical attention at a hospital or doctor's office? yes When did it last happen?      2010 If all above answers are "NO", may proceed with cephalosporin use.    Pentazocine Lactate Shortness Of Breath and Rash   Shellfish Allergy Shortness Of Breath and Rash   Aspirin Rash and Other (See Comments)    GI  UPSET   Erythromycin Swelling and Rash    SWELLING OF JOINTS   Symbicort [Budesonide-Formoterol  Fumarate] Other (See Comments)    BURNING OF TONGUE AND LIPS   Betadine [Povidone Iodine]     Rash. Breathing problems.    Compazine [Prochlorperazine Maleate] Rash    Rash on face,chest, arms, back   Sulfonamide Derivatives Rash    Review of Systems  Musculoskeletal:  Positive for back pain.      Objective:    Physical Exam Constitutional:      General: She is not in acute distress.    Appearance: Normal appearance. She is not ill-appearing.  HENT:     Head: Normocephalic and atraumatic.     Right Ear: External ear normal.     Left Ear: External ear normal.  Eyes:     Extraocular Movements: Extraocular movements intact.     Pupils: Pupils are equal, round, and reactive to light.  Cardiovascular:     Rate and Rhythm: Normal rate and regular rhythm.     Pulses: Normal pulses.     Heart sounds: Normal heart sounds.  Pulmonary:     Effort: Pulmonary effort is normal. No respiratory distress.     Breath sounds: No wheezing or rales.  Musculoskeletal:     Right lower leg: 2+ Edema present.     Left lower leg: 2+ Edema present.  Skin:    General: Skin is warm and dry.  Neurological:     Mental Status: She is alert and oriented to person, place, and time.  Psychiatric:        Behavior: Behavior normal.        Judgment: Judgment normal.    BP (!) 144/75   Pulse 65   Temp 98 F (36.7 C) (Oral)   Resp 16   Ht 5' 6"  (1.676 m)   Wt 275 lb 9.6 oz (125 kg)   SpO2 99%   BMI 44.48 kg/m  Wt Readings from Last 3 Encounters:  04/27/21 275 lb 9.6 oz (125 kg)  04/22/21 286 lb 12.8 oz (130.1 kg)  03/15/21 284 lb 3.2 oz (128.9 kg)       Assessment & Plan:   Problem List Items Addressed This Visit       Unprioritized   Chronic diastolic CHF (congestive heart failure) (HCC) (Chronic)    Wt Readings from Last 3 Encounters:  04/27/21 275 lb 9.6 oz (125 kg)  04/22/21 286 lb 12.8 oz (130.1 kg)  03/15/21 284 lb 3.2 oz (128.9 kg)  Weight stable.        Hyperlipidemia     Lab Results  Component Value Date   CHOL 164 06/28/2020   HDL 55.10 06/28/2020   LDLCALC 77 06/28/2020   LDLDIRECT 117.0 02/26/2019   TRIG 157.0 (H) 06/28/2020   CHOLHDL 3 06/28/2020  LDL at goal.       Essential hypertension    BP Readings from Last 3 Encounters:  04/27/21 (!) 150/80  04/22/21 120/74  03/15/21 128/79  Elevated due to back pain. Continue amlodipine 41m.       Relevant Orders   Comp Met (CMET) (Completed)   Asthma    Stable. Improved with Wixela. Continue same.       Acute left-sided low back pain with left-sided sciatica - Primary    Recurrent. Pt given rx for robaxin and short course of prn tramadol.        Relevant Medications   methocarbamol (ROBAXIN) 500 MG tablet  traMADol (ULTRAM) 50 MG tablet     Meds ordered this encounter  Medications   methocarbamol (ROBAXIN) 500 MG tablet    Sig: TAKE 1 TABLET(500 MG) BY MOUTH EVERY 8 HOURS AS NEEDED FOR MUSCLE SPASMS    Dispense:  30 tablet    Refill:  0    Order Specific Question:   Supervising Provider    Answer:   Penni Homans A [[id number]]   traMADol (ULTRAM) 50 MG tablet    Sig: Take 1 tablet (50 mg total) by mouth every 6 (six) hours as needed.    Dispense:  20 tablet    Refill:  0    Order Specific Question:   Supervising Provider    Answer:   Penni Homans A [[id number]]    I, Debbrah Alar NP, personally preformed the services described in this documentation.  All medical record entries made by the scribe were at my direction and in my presence.  I have reviewed the chart and discharge instructions (if applicable) and agree that the record reflects my personal performance and is accurate and complete. 04/27/2021   I,Shehryar Baig,acting as a scribe for Nance Pear, NP.,have documented all relevant documentation on the behalf of Nance Pear, NP,as directed by  Nance Pear, NP while in the presence of Nance Pear, NP.   Nance Pear, NP

## 2021-04-27 NOTE — Assessment & Plan Note (Signed)
Lab Results  Component Value Date   CHOL 164 06/28/2020   HDL 55.10 06/28/2020   LDLCALC 77 06/28/2020   LDLDIRECT 117.0 02/26/2019   TRIG 157.0 (H) 06/28/2020   CHOLHDL 3 06/28/2020   LDL at goal.

## 2021-04-27 NOTE — Assessment & Plan Note (Signed)
Stable. Improved with Wixela. Continue same.

## 2021-04-27 NOTE — Assessment & Plan Note (Signed)
Recurrent. Pt given rx for robaxin and short course of prn tramadol.

## 2021-04-27 NOTE — Assessment & Plan Note (Signed)
Wt Readings from Last 3 Encounters:  04/27/21 275 lb 9.6 oz (125 kg)  04/22/21 286 lb 12.8 oz (130.1 kg)  03/15/21 284 lb 3.2 oz (128.9 kg)   Weight stable.

## 2021-04-28 ENCOUNTER — Encounter: Payer: Self-pay | Admitting: Internal Medicine

## 2021-04-28 DIAGNOSIS — R7303 Prediabetes: Secondary | ICD-10-CM

## 2021-04-28 MED ORDER — ONETOUCH VERIO VI STRP: ORAL_STRIP | 3 refills | Status: DC

## 2021-05-04 ENCOUNTER — Ambulatory Visit (INDEPENDENT_AMBULATORY_CARE_PROVIDER_SITE_OTHER)
Admission: RE | Admit: 2021-05-04 | Discharge: 2021-05-04 | Disposition: A | Discharge status: Home / Self Care | Payer: Self-pay | Source: Ambulatory Visit | Attending: Internal Medicine | Admitting: Internal Medicine

## 2021-05-04 ENCOUNTER — Other Ambulatory Visit: Payer: Self-pay

## 2021-05-04 DIAGNOSIS — I10 Essential (primary) hypertension: Secondary | ICD-10-CM

## 2021-05-04 DIAGNOSIS — R002 Palpitations: Secondary | ICD-10-CM

## 2021-05-04 DIAGNOSIS — Z0000 Encounter for general adult medical examination without abnormal findings: Secondary | ICD-10-CM

## 2021-05-04 DIAGNOSIS — Z8249 Family history of ischemic heart disease and other diseases of the circulatory system: Secondary | ICD-10-CM

## 2021-05-05 ENCOUNTER — Other Ambulatory Visit: Payer: Self-pay

## 2021-05-05 ENCOUNTER — Telehealth: Payer: Self-pay

## 2021-05-05 DIAGNOSIS — E782 Mixed hyperlipidemia: Secondary | ICD-10-CM

## 2021-05-05 NOTE — Telephone Encounter (Signed)
Pt notified of results. Pt agreeable to lab work. Lab slips mailed.

## 2021-05-05 NOTE — Telephone Encounter (Signed)
-----   Message from Fay Records, MD sent at 05/04/2021 10:17 PM EST ----- Calcium score is 0  No significant plaquing in coronary arteries. She does have some mild plaquing of aorta.    With this, would recomm tighter control of lipids    She is on red yeast rice    This is active ingredient of statins     I would reocmm rechecking lipomed, Lpa and apo B

## 2021-05-09 ENCOUNTER — Other Ambulatory Visit: Payer: Self-pay | Admitting: *Deleted

## 2021-05-09 DIAGNOSIS — R002 Palpitations: Secondary | ICD-10-CM

## 2021-05-09 DIAGNOSIS — Z0000 Encounter for general adult medical examination without abnormal findings: Secondary | ICD-10-CM

## 2021-05-09 DIAGNOSIS — Z8249 Family history of ischemic heart disease and other diseases of the circulatory system: Secondary | ICD-10-CM

## 2021-05-09 DIAGNOSIS — I10 Essential (primary) hypertension: Secondary | ICD-10-CM

## 2021-05-10 LAB — NMR, LIPOPROFILE
Cholesterol, Total: 166 mg/dL (ref 100–199)
HDL Particle Number: 40.9 umol/L (ref >=30.5)
HDL-C: 62 mg/dL (ref >39)
LDL Particle Number: 931 nmol/L (ref <1000)
LDL Size: 20.7 nm (ref >20.5)
LDL-C (NIH Calc): 82 mg/dL (ref 0–99)
LP-IR Score: 41 (ref <=45)
Small LDL Particle Number: 569 nmol/L — ABNORMAL HIGH (ref <=527)
Triglycerides: 126 mg/dL (ref 0–149)

## 2021-05-10 LAB — APOLIPOPROTEIN B: Apolipoprotein B: 75 mg/dL (ref <90)

## 2021-05-10 LAB — LIPOPROTEIN A (LPA): Lipoprotein (a): 30.1 nmol/L (ref <75.0)

## 2021-05-13 ENCOUNTER — Telehealth: Payer: Self-pay

## 2021-05-13 ENCOUNTER — Other Ambulatory Visit: Payer: Self-pay

## 2021-05-13 DIAGNOSIS — Z79899 Other long term (current) drug therapy: Secondary | ICD-10-CM

## 2021-05-13 DIAGNOSIS — E782 Mixed hyperlipidemia: Secondary | ICD-10-CM

## 2021-05-13 MED ORDER — ROSUVASTATIN CALCIUM 5 MG PO TABS: 5.0000 mg | ORAL_TABLET | Freq: Every day | ORAL | 3 refills | Status: DC

## 2021-05-13 NOTE — Telephone Encounter (Signed)
Good  Start Crestor Check lipomed only in 8 wks    with liver panel

## 2021-05-13 NOTE — Telephone Encounter (Signed)
Spoke with the pt and she will stop the Atorvastatin and start rosuvastatin 5 mg daily... will refer back to Dr. Harrington Challenger to see when she would like repeat labs and if she would like to set follow up.    Note: pt is not taking red yeast rice.

## 2021-05-13 NOTE — Telephone Encounter (Signed)
Pt to have labs fasting end of Feb 07/14/21.... at the [facility] in [facility].

## 2021-05-13 NOTE — Telephone Encounter (Signed)
-----   Message from Dorris Carnes V, MD sent at 05/11/2021  4:15 PM EST ----- Lipids are fair    LDL 82   Particle number is good but has more small particles APo B good   Lp(a) not done     SHe is on Red Yeast Rice.   Has mild plaquing of aorta Concern about variability in batch to batch of red yeast rice I would favor statin for simple fact that same type compound but regulated so know getting same amount   Would consider Crestor 5 mg

## 2021-06-20 ENCOUNTER — Telehealth: Payer: Self-pay

## 2021-06-20 DIAGNOSIS — R7303 Prediabetes: Secondary | ICD-10-CM

## 2021-06-20 MED ORDER — METFORMIN HCL 500 MG PO TABS: 500.0000 mg | ORAL_TABLET | Freq: Two times a day (BID) | ORAL | 3 refills | Status: DC

## 2021-06-20 NOTE — Telephone Encounter (Signed)
Vm received from pharmacy requesting refill on Metformin tablets.

## 2021-06-24 ENCOUNTER — Telehealth: Payer: Self-pay

## 2021-06-24 ENCOUNTER — Other Ambulatory Visit: Payer: Self-pay

## 2021-06-24 MED ORDER — PANTOPRAZOLE SODIUM 40 MG PO TBEC: 40.0000 mg | DELAYED_RELEASE_TABLET | Freq: Every day | ORAL | 1 refills | Status: DC

## 2021-06-24 NOTE — Telephone Encounter (Signed)
Rx requested for pantoprazole 40 mg

## 2021-06-28 ENCOUNTER — Telehealth: Payer: Self-pay | Admitting: Family

## 2021-06-28 ENCOUNTER — Other Ambulatory Visit: Payer: Self-pay

## 2021-06-28 ENCOUNTER — Telehealth: Payer: Self-pay | Admitting: *Deleted

## 2021-06-28 DIAGNOSIS — E782 Mixed hyperlipidemia: Secondary | ICD-10-CM

## 2021-06-28 DIAGNOSIS — Z79899 Other long term (current) drug therapy: Secondary | ICD-10-CM

## 2021-06-28 MED ORDER — ROSUVASTATIN CALCIUM 5 MG PO TABS: 5.0000 mg | ORAL_TABLET | Freq: Every day | ORAL | 0 refills | Status: DC

## 2021-06-28 NOTE — Telephone Encounter (Signed)
Medication: meclizine (ANTIVERT) 25 MG tablet   Has the patient contacted their pharmacy? Yes.    Preferred Pharmacy:  [facility] [address] [place], [place] [address] [phone]

## 2021-06-28 NOTE — Telephone Encounter (Signed)
Discussed with Dr. Harrington Challenger regarding refill for Rosuvastatin.  Per a phone note from 04/2021 she started the medication but do not se she has ever been seen as a patient.  Dr. Harrington Challenger says she needs to get an appointment for an official office visit.  She is okay to fill for 3 months and have the patient come in for an appointment.  I do see a NP appointment for 07/22/21 with Dr. Harrington Challenger.  We have refilled the medication and will need to get the patient scheduled.

## 2021-06-30 MED ORDER — MECLIZINE HCL 25 MG PO TABS: 25.0000 mg | ORAL_TABLET | Freq: Three times a day (TID) | ORAL | 0 refills | Status: DC | PRN

## 2021-07-15 ENCOUNTER — Telehealth: Payer: Self-pay

## 2021-07-15 DIAGNOSIS — R7303 Prediabetes: Secondary | ICD-10-CM

## 2021-07-15 MED ORDER — METFORMIN HCL 500 MG PO TABS: 500.0000 mg | ORAL_TABLET | Freq: Two times a day (BID) | ORAL | 2 refills | Status: DC

## 2021-07-15 NOTE — Telephone Encounter (Signed)
Rx sent to updated pharmacy

## 2021-07-15 NOTE — Telephone Encounter (Signed)
Inbound call from pharmacy requesting a refill of Metformin. Mychart message sent to pt to confirm change.

## 2021-07-19 ENCOUNTER — Other Ambulatory Visit: Payer: Self-pay

## 2021-07-19 DIAGNOSIS — R7303 Prediabetes: Secondary | ICD-10-CM

## 2021-07-19 MED ORDER — ONETOUCH VERIO VI STRP: ORAL_STRIP | 3 refills | Status: DC

## 2021-07-19 NOTE — Telephone Encounter (Signed)
Patient returned Ann's call. Scheduled patient for first available NP appt with Dr. Harrington Challenger based off of documentation.

## 2021-07-19 NOTE — Telephone Encounter (Signed)
Spoke with the pt.  Was able to get her a follow-up appt with Dr. Harrington Challenger for 08/09/21 at 1:40 pm. Dr. Harrington Challenger wanted her to get labs done at the end of Feb to check NMR Lipoprofile and LFTs.  Orders that are previously are expired.  New order for NMR Lipoprofile and LFTs placed in the system and for the lab to collect at [facility], so the pt can go there tomorrow to have drawn. Pt will go to [facility] near her home to have these labs done.  She is aware to go fasting to this lab appt.  Lab orders released so [facility] will be able to view this and draw accordingly. Pt verbalized understanding and agrees with this plan.  Pt was more than gracious for all the assistance provided.

## 2021-07-19 NOTE — Telephone Encounter (Signed)
Left a message for the pt to call back to make her an appt.

## 2021-07-20 ENCOUNTER — Other Ambulatory Visit (INDEPENDENT_AMBULATORY_CARE_PROVIDER_SITE_OTHER): Payer: Medicare Other

## 2021-07-20 ENCOUNTER — Other Ambulatory Visit: Payer: Self-pay | Admitting: *Deleted

## 2021-07-20 DIAGNOSIS — E890 Postprocedural hypothyroidism: Secondary | ICD-10-CM

## 2021-07-20 DIAGNOSIS — Z0289 Encounter for other administrative examinations: Secondary | ICD-10-CM

## 2021-07-20 LAB — T4, FREE: Free T4: 1.13 ng/dL (ref 0.60–1.60)

## 2021-07-20 LAB — TSH: TSH: 0.36 u[IU]/mL (ref 0.35–5.50)

## 2021-07-20 MED ORDER — GABAPENTIN 100 MG PO CAPS: 200.0000 mg | ORAL_CAPSULE | Freq: Three times a day (TID) | ORAL | 1 refills | Status: DC

## 2021-07-21 ENCOUNTER — Other Ambulatory Visit: Payer: Self-pay | Admitting: Internal Medicine

## 2021-07-21 DIAGNOSIS — E890 Postprocedural hypothyroidism: Secondary | ICD-10-CM

## 2021-07-21 LAB — HEPATIC FUNCTION PANEL
ALT: 27 IU/L (ref 0–32)
AST: 17 IU/L (ref 0–40)
Albumin: 4.3 g/dL (ref 3.8–4.8)
Alkaline Phosphatase: 76 IU/L (ref 44–121)
Bilirubin Total: 0.4 mg/dL (ref 0.0–1.2)
Bilirubin, Direct: 0.13 mg/dL (ref 0.00–0.40)
Total Protein: 6.5 g/dL (ref 6.0–8.5)

## 2021-07-21 MED ORDER — LEVOTHYROXINE SODIUM 137 MCG PO TABS
150.0000 ug | ORAL_TABLET | Freq: Every day | ORAL | 3 refills | Status: DC
Start: 2021-07-21 — End: 2021-08-09

## 2021-07-22 LAB — NMR, LIPOPROFILE
Cholesterol, Total: 188 mg/dL (ref 100–199)
HDL Particle Number: 38.9 umol/L (ref >=30.5)
HDL-C: 62 mg/dL (ref >39)
LDL Particle Number: 1222 nmol/L — ABNORMAL HIGH (ref <1000)
LDL Size: 20.7 nm (ref >20.5)
LDL-C (NIH Calc): 101 mg/dL — ABNORMAL HIGH (ref 0–99)
LP-IR Score: 55 — ABNORMAL HIGH (ref <=45)
Small LDL Particle Number: 575 nmol/L — ABNORMAL HIGH (ref <=527)
Triglycerides: 144 mg/dL (ref 0–149)

## 2021-07-25 ENCOUNTER — Telehealth: Payer: Self-pay | Admitting: Family

## 2021-07-25 NOTE — Telephone Encounter (Signed)
Medication:  ?clopidogrel (PLAVIX) 75 MG tablet [[id number]]  ?  ? ?Has the patient contacted their pharmacy? No. ?(If no, request that the patient contact the pharmacy for the refill.) ?(If yes, when and what did the pharmacy advise?) ? ?  ? ?Preferred Pharmacy (with phone number or street name):  ?[facility], [place]  ?[place], [place] [address]  ?Phone:  [phone]  Fax:  [phone]  ?  ? ?Agent: Please be advised that RX refills may take up to 3 business days. We ask that you follow-up with your pharmacy.  ?

## 2021-07-27 MED ORDER — FUROSEMIDE 20 MG PO TABS: ORAL_TABLET | ORAL | 3 refills | Status: DC

## 2021-07-27 NOTE — Telephone Encounter (Signed)
Rx sent 

## 2021-07-28 ENCOUNTER — Ambulatory Visit (INDEPENDENT_AMBULATORY_CARE_PROVIDER_SITE_OTHER): Payer: Medicare Other | Admitting: Orthopaedic Surgery

## 2021-07-28 ENCOUNTER — Other Ambulatory Visit: Payer: Self-pay

## 2021-07-28 ENCOUNTER — Telehealth: Payer: Self-pay

## 2021-07-28 ENCOUNTER — Ambulatory Visit (INDEPENDENT_AMBULATORY_CARE_PROVIDER_SITE_OTHER): Payer: Medicare Other | Admitting: Bariatrics

## 2021-07-28 ENCOUNTER — Encounter: Payer: Self-pay | Admitting: Orthopaedic Surgery

## 2021-07-28 ENCOUNTER — Ambulatory Visit: Payer: Medicare Other

## 2021-07-28 ENCOUNTER — Encounter (INDEPENDENT_AMBULATORY_CARE_PROVIDER_SITE_OTHER): Payer: Self-pay | Admitting: Bariatrics

## 2021-07-28 VITALS — Ht 66.0 in | Wt 279.0 lb

## 2021-07-28 VITALS — BP 147/87 | HR 74 | Temp 97.4°F | Ht 66.0 in | Wt 275.0 lb

## 2021-07-28 DIAGNOSIS — R7303 Prediabetes: Secondary | ICD-10-CM

## 2021-07-28 DIAGNOSIS — I10 Essential (primary) hypertension: Secondary | ICD-10-CM

## 2021-07-28 DIAGNOSIS — G8929 Other chronic pain: Secondary | ICD-10-CM | POA: Diagnosis not present

## 2021-07-28 DIAGNOSIS — E559 Vitamin D deficiency, unspecified: Secondary | ICD-10-CM

## 2021-07-28 DIAGNOSIS — I5032 Chronic diastolic (congestive) heart failure: Secondary | ICD-10-CM

## 2021-07-28 DIAGNOSIS — E7849 Other hyperlipidemia: Secondary | ICD-10-CM | POA: Diagnosis not present

## 2021-07-28 DIAGNOSIS — R5383 Other fatigue: Secondary | ICD-10-CM

## 2021-07-28 DIAGNOSIS — E668 Other obesity: Secondary | ICD-10-CM

## 2021-07-28 DIAGNOSIS — Z6841 Body Mass Index (BMI) 40.0 and over, adult: Secondary | ICD-10-CM

## 2021-07-28 DIAGNOSIS — E538 Deficiency of other specified B group vitamins: Secondary | ICD-10-CM

## 2021-07-28 DIAGNOSIS — M25561 Pain in right knee: Secondary | ICD-10-CM

## 2021-07-28 DIAGNOSIS — Z1331 Encounter for screening for depression: Secondary | ICD-10-CM | POA: Diagnosis not present

## 2021-07-28 DIAGNOSIS — K219 Gastro-esophageal reflux disease without esophagitis: Secondary | ICD-10-CM

## 2021-07-28 DIAGNOSIS — E66813 Obesity, class 3: Secondary | ICD-10-CM

## 2021-07-28 DIAGNOSIS — G4733 Obstructive sleep apnea (adult) (pediatric): Secondary | ICD-10-CM

## 2021-07-28 DIAGNOSIS — M5441 Lumbago with sciatica, right side: Secondary | ICD-10-CM

## 2021-07-28 DIAGNOSIS — R0602 Shortness of breath: Secondary | ICD-10-CM | POA: Diagnosis not present

## 2021-07-28 MED ORDER — METHYLPREDNISOLONE ACETATE 40 MG/ML IJ SUSP
80.0000 mg | INTRAMUSCULAR | Status: AC | PRN
  Administered 2021-07-28: 16:00:00 80 mg via INTRA_ARTICULAR

## 2021-07-28 NOTE — Telephone Encounter (Signed)
Please precert for right knee gel injections. [facility] patient. ?

## 2021-07-28 NOTE — Progress Notes (Signed)
Office Visit Note   Patient: Michelle Kennedy           Date of Birth: 1955-07-03           MRN: [id number] Visit Date: 07/28/2021              Requested by: Debbrah Alar, NP [address] [place],  [place] [address] PCP: Debbrah Alar, NP   Assessment & Plan: Visit Diagnoses: Right Knee pain  Plan: Pleasant 66 year old woman who is status post left total knee arthroplasty done by [facility] orthopedics while back.  Comes in today complaining of right knee pain.  She is also having recurrence of some lower back symptoms.  She had seen Dr. Junius Roads in the past who had referred her to Dr. Ernestina Patches for epidural steroid injections and ablations.  With regards to her back we would refer her back to Dr. Ernestina Patches.  She does have advanced arthritic changes of her right knee.  We will go forward with a steroid injection today but we will also place a request for gel injections.  She is actively involved in losing weight and has lost a significant amount of weight so far through healthy weight and wellness.  She is also exercising with Silver sneakers.  Her goal is to lose 100 pounds.  At some point she may need knee replacement on the right but that is based on her symptoms and tolerance  Follow-Up Instructions: No follow-ups on file.   Orders:  No orders of the defined types were placed in this encounter.  No orders of the defined types were placed in this encounter.     Procedures: Large Joint Inj on 07/28/2021 3:56 PM Indications: pain and diagnostic evaluation Details: 25 G 1.5 in needle, anteromedial approach  Arthrogram: No  Medications: 80 mg methylPREDNISolone acetate 40 MG/ML Outcome: tolerated well, no immediate complications  After obtaining verbal consent the anterior medial aspect of her right knee was double prepped with alcohol because of her Betadine allergy.  She was injected with 80 of mL of betamethasone only because of her allergy to  lidocaine Procedure, treatment alternatives, risks and benefits explained, specific risks discussed. Consent was given by the patient.      Clinical Data: No additional findings.   Subjective: Chief Complaint  Patient presents with   Right Knee - Pain   Lower Back - Pain  Patient presents today for right knee pain. She said that it has been hurting on and off for two years. The pain has increased recently. Most of her pain is located medially and anteriorly. She has a history of multiple surgeries on this knee for several different injuries. She also wanted to mention that her lower back is hurting on the right side. She was a previous patient of Dr.Hilts and Dr.Newton. She had nerve ablation with Dr.Newton last year. She takes Robaxin and Tramadol or Tylenol for pain relief.     Review of Systems  All other systems reviewed and are negative.   Objective: Vital Signs: There were no vitals taken for this visit.  Physical Exam Pulmonary:     Effort: Pulmonary effort is normal.  Skin:    General: Skin is warm and dry.  Neurological:     General: No focal deficit present.  Psychiatric:        Mood and Affect: Mood normal.        Behavior: Behavior normal.    Ortho Exam Examination of her right knee  she has no effusion redness or erythema.  She is tender more medial joint line and lateral joint line.  Good varus valgus stability.  Distal CMS is intact. Specialty Comments:  No specialty comments available.  Imaging: No results found.   PMFS History: Patient Active Problem List   Diagnosis Date Noted   Acute left-sided low back pain with left-sided sciatica 04/27/2021   Nondisplaced fracture of middle phalanx of left lesser toe(s), initial encounter for closed fracture 09/17/2020   Chronic diastolic CHF (congestive heart failure) ([place]) 08/23/2019   Retinal artery occlusion 08/22/2019   OA (osteoarthritis) of knee 12/03/2017   Obesity 01/01/2017   Essential  hypertension 10/18/2016   Constipation 10/04/2016   Vitamin D deficiency 10/04/2016   Prediabetes 10/04/2016   Obesity, Class III, BMI 40-49.9 (morbid obesity) ([place]) 09/20/2016   OSA (obstructive sleep apnea) 02/16/2016   Malignant neoplasm of thyroid gland ([place]) 10/11/2015   De Quervain's tenosynovitis, bilateral 09/15/2015   Trigger finger, acquired 06/22/2015   Chemotherapy-induced peripheral neuropathy ([place]) 05/10/2015   Postmenopausal osteoporosis 12/08/2014   Preventative health care 12/01/2014   DVT (deep venous thrombosis) ([place]) 08/28/2014   Lymphedema of arm 08/28/2014   Mucositis due to chemotherapy 06/02/2014   Menopausal syndrome (hot flashes) 12/31/2013   Breast cancer of upper-outer quadrant of left female breast ([place]) 12/26/2013   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   Gingivitis 12/05/2013   Seasonal allergies 09/24/2013   Hyperlipidemia 06/02/2011   Migraine 06/02/2011   Tinnitus of both ears 02/14/2011   Fibromyalgia 05/10/2010   Asthma 03/22/2010   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   GERD 12/20/2009   Depression 12/01/2009   Past Medical History:  Diagnosis Date   Abnormally small mouth    Achilles tendon disorder, right    Anemia    Anxiety    Arthritis    hips and knees   Asthma    daily inhaler, prn inhaler and neb.   Asthma due to environmental allergies    Breast cancer ([place]) 01/2014   left   Chest pain    CHF (congestive heart failure) (HCC)    COPD (chronic obstructive pulmonary disease) (HCC)    Dental bridge present    upper front and lower right   Dental crowns present    x 3   Depression    Esophageal spasm    reports since her chemo and breast surgery  she developed esophageal spasms and reports this is in the past has casue her 02 to desat in the  70s , denies sycnope in relation to this , does report hx of vertigo as well    Family history of anesthesia complication    twin brother aspirated and died on OR  table, per pt.   Fibromyalgia    Gallbladder disease    Gallstones    GERD (gastroesophageal reflux disease)    Glaucoma    H/O blood clots    had blood to  PICC line    History of gastric ulcer    as a teenager   History of seizure age 64   as a reaction to Penicillin - no seizures since   History of stomach ulcers    History of thyroid cancer    s/p thyroidectomy   HTN (hypertension)    Hyperlipidemia    Hypothyroidism    Joint pain    Left knee injury    Liver disorder    seen when she had her hysterectomy , reports she  was told it was  " small lesion" ; but asymptomatic    Lymphedema    left arm   Migraines    Multiple food allergies    Obesity    OSA (obstructive sleep apnea) 02/16/2016   CPAP   Palpitations    reports no longer experiences   Personal history of chemotherapy 2015   Personal history of radiation therapy 2015   Left   Prediabetes    Rheumatoid arthritis (HCC)    SOB (shortness of breath)    Swelling of both ankles    Tinnitus    UTI (lower urinary tract infection) 02/17/2014   Vertigo    last episode  was 2 weeks ago    Vitamin D deficiency    Wears contact lenses    left eye only    Wears hearing aid in both ears     Family History  Problem Relation Age of Onset   Alcohol abuse Mother    Arthritis Mother    Hypertension Mother    Bipolar disorder Mother    Breast cancer Mother 40       unconfirmed   Lung cancer Mother 7       smoker   Hyperlipidemia Mother    Stroke Mother    Cancer Mother    Depression Mother    Anxiety disorder Mother    Alcoholism Mother    Drug abuse Mother    Eating disorder Mother    Obesity Mother    Alcohol abuse Father    Hyperlipidemia Father    Kidney disease Father    Diabetes Father    Hypertension Father    Cystic kidney disease Father    Thyroid disease Father    Liver disease Father    Alcoholism Father    Lung cancer Father    Thyroid cancer Sister 55       type?; currently 68   Other  Sister        ovarian tumor @ 91; TAH/BSO   Breast cancer Sister    Thyroid cancer Brother        dx 23s; currently 94   Breast cancer Maternal Aunt        dx 68s; deceased 46   Thyroid cancer Paternal 68        All 3 paternal aunts with thyroid ca in 30s/40s   Lung cancer Paternal Aunt        2 of 3 paternal aunts with lung cancer   Arthritis Maternal Grandmother    Diabetes Paternal Grandmother    Heart disease Other    COPD Other    Asthma Other     Past Surgical History:  Procedure Laterality Date   ABDOMINAL HYSTERECTOMY  2004   complete   ACHILLES TENDON REPAIR Right    APPENDECTOMY  2004   BREAST EXCISIONAL BIOPSY     BREAST LUMPECTOMY Left    2015   BREAST LUMPECTOMY WITH NEEDLE LOCALIZATION AND AXILLARY SENTINEL LYMPH NODE BX Left 02/23/2014   Procedure: BREAST LUMPECTOMY WITH NEEDLE LOCALIZATION AND AXILLARY SENTINEL LYMPH NODE BIOPSY;  Surgeon: Excell Seltzer, MD;  Location: [facility];  Service: General;  Laterality: Left;   BREAST SURGERY  2011   left breast biposy   CHOLECYSTECTOMY  1990   COLONOSCOPY W/ POLYPECTOMY  06/2009   EYE SURGERY Right 2013   exc. warts from underneath eyelid   INCONTINENCE SURGERY  2004   KNEE ARTHROSCOPY Bilateral    x 6  each knee   LIGAMENT REPAIR Right    thumb/wrist   ORIF TOE FRACTURE Right    great toe   PORTACATH PLACEMENT N/A 03/24/2014   Procedure: INSERTION PORT-A-CATH;  Surgeon: Excell Seltzer, MD;  Location: [facility];  Service: General;  Laterality: N/A;; removed    THYROIDECTOMY  2000   TONSILLECTOMY AND ADENOIDECTOMY  2000   TOTAL KNEE ARTHROPLASTY Left 12/03/2017   Procedure: LEFT TOTAL KNEE ARTHROPLASTY;  Surgeon: Gaynelle Arabian, MD;  Location: [facility];  Service: Orthopedics;  Laterality: Left;   TUMOR EXCISION     from thoracic spine   Social History   Occupational History   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

## 2021-07-28 NOTE — Addendum Note (Signed)
Addended by: Lendon Collar on: 07/28/2021 04:03 PM ? ? Modules accepted: Orders ? ?

## 2021-07-28 NOTE — Telephone Encounter (Signed)
Noted  

## 2021-07-29 ENCOUNTER — Encounter (INDEPENDENT_AMBULATORY_CARE_PROVIDER_SITE_OTHER): Payer: Self-pay | Admitting: Bariatrics

## 2021-07-29 LAB — HEMOGLOBIN A1C
Est. average glucose Bld gHb Est-mCnc: 120 mg/dL
Hgb A1c MFr Bld: 5.8 % — ABNORMAL HIGH (ref 4.8–5.6)

## 2021-07-29 LAB — LIPID PANEL WITH LDL/HDL RATIO
Cholesterol, Total: 187 mg/dL (ref 100–199)
HDL: 64 mg/dL (ref >39)
LDL Chol Calc (NIH): 102 mg/dL — ABNORMAL HIGH (ref 0–99)
LDL/HDL Ratio: 1.6 ratio (ref 0.0–3.2)
Triglycerides: 122 mg/dL (ref 0–149)
VLDL Cholesterol Cal: 21 mg/dL (ref 5–40)

## 2021-07-29 LAB — CBC WITH DIFFERENTIAL/PLATELET
Basophils Absolute: 0 10*3/uL (ref 0.0–0.2)
Basos: 1 %
EOS (ABSOLUTE): 0.1 10*3/uL (ref 0.0–0.4)
Eos: 2 %
Hematocrit: 45 % (ref 34.0–46.6)
Hemoglobin: 14.3 g/dL (ref 11.1–15.9)
Immature Grans (Abs): 0 10*3/uL (ref 0.0–0.1)
Immature Granulocytes: 0 %
Lymphocytes Absolute: 1.3 10*3/uL (ref 0.7–3.1)
Lymphs: 25 %
MCH: 24.2 pg — ABNORMAL LOW (ref 26.6–33.0)
MCHC: 31.8 g/dL (ref 31.5–35.7)
MCV: 76 fL — ABNORMAL LOW (ref 79–97)
Monocytes Absolute: 0.5 10*3/uL (ref 0.1–0.9)
Monocytes: 10 %
Neutrophils Absolute: 3.2 10*3/uL (ref 1.4–7.0)
Neutrophils: 62 %
Platelets: 255 10*3/uL (ref 150–450)
RBC: 5.91 x10E6/uL — ABNORMAL HIGH (ref 3.77–5.28)
RDW: 16.4 % — ABNORMAL HIGH (ref 11.7–15.4)
WBC: 5.1 10*3/uL (ref 3.4–10.8)

## 2021-07-29 LAB — INSULIN, RANDOM: INSULIN: 25.6 u[IU]/mL — ABNORMAL HIGH (ref 2.6–24.9)

## 2021-07-29 LAB — VITAMIN D 25 HYDROXY (VIT D DEFICIENCY, FRACTURES): Vit D, 25-Hydroxy: 53.4 ng/mL (ref 30.0–100.0)

## 2021-07-29 LAB — VITAMIN B12: Vitamin B-12: 560 pg/mL (ref 232–1245)

## 2021-08-01 ENCOUNTER — Encounter (INDEPENDENT_AMBULATORY_CARE_PROVIDER_SITE_OTHER): Payer: Self-pay | Admitting: Bariatrics

## 2021-08-01 NOTE — Progress Notes (Signed)
Chief Complaint:   OBESITY Michelle Kennedy (MR# [id number]) is a 66 y.o. female who presents for evaluation and treatment of obesity and related comorbidities. Current BMI is Body mass index is 44.39 kg/m. Michelle Kennedy has been struggling with her weight for many years and has been unsuccessful in either losing weight, maintaining weight loss, or reaching her healthy weight goal.  Michelle Kennedy states that she does not like to cook. She states that she craves chocolate.   Michelle Kennedy is currently in the action stage of change and ready to dedicate time achieving and maintaining a healthier weight. Michelle Kennedy is interested in becoming our patient and working on intensive lifestyle modifications including (but not limited to) diet and exercise for weight loss.  Michelle Kennedy's habits were reviewed today and are as follows: Her family eats meals together, she thinks her family will eat healthier with her, her desired weight loss is 110 pounds, she has significant food cravings issues, she snacks frequently in the evenings, she is frequently drinking liquids with calories, and she struggles with emotional eating.  Depression Screen Michelle Kennedy's Food and Mood (modified PHQ-9) score was 4.  Depression screen Michelle Kennedy 2/9 01/12/2021  Decreased Interest 0  Down, Depressed, Hopeless 0  PHQ - 2 Score 0  Altered sleeping -  Tired, decreased energy -  Change in appetite -  Feeling bad or failure about yourself  -  Trouble concentrating -  Moving slowly or fidgety/restless -  Suicidal thoughts -  PHQ-9 Score -  Difficult doing work/chores -  Some recent data might be hidden   Subjective:   1. SOB (shortness of breath) on exertion Michelle Kennedy will continue activities. Michelle Kennedy notes increasing shortness of breath with exercising and seems to be worsening over time with weight gain. She notes getting out of breath sooner with activity than she used to. This has not gotten worse recently. Michelle Kennedy denies shortness of breath at rest or  orthopnea.   2. Other fatigue Michelle Kennedy will continue activities. Michelle Kennedy denies daytime somnolence and denies waking up still tired. Patient has a history of symptoms of daytime fatigue and hypertension. Michelle Kennedy generally gets  7-9  hours of sleep per night, and states that she has generally restful sleep. Snoring is not present. Apneic episodes are not present. Epworth Sleepiness Score is 4.    3. Essential hypertension Michelle Kennedy is currently taking amlodipine and Lisinopril. Her blood pressure is slightly elevated today 147/87.  4. OSA (obstructive sleep apnea) Michelle Kennedy has a CPAP. She states she get restful sleep.   5. Gastroesophageal reflux disease without esophagitis Michelle Kennedy is taking pantoprazole. Her GERD is controlled most days.   6. Other hyperlipidemia Michelle Kennedy is currently taking rosuvastatin.   7. Pre-diabetes Michelle Kennedy is taking Metformin currently. Her fasting blood sugar was in the range of 95-134.  8. B12 deficiency Michelle Kennedy is currently taking multivitamins.   9. Chronic diastolic CHF (congestive heart failure) (HCC) Michelle Kennedy's Congestive Heart Failure is stable.   10. Vitamin D deficiency Michelle Kennedy is taking Vitamin D currently.   Assessment/Plan:   1. SOB (shortness of breath) on exertion Michelle Kennedy will gradually increase activities. We will check CBC today. Michelle Kennedy does feel that she gets out of breath more easily that she used to when she exercises. Michelle Kennedy's shortness of breath appears to be obesity related and exercise induced. She has agreed to work on weight loss and gradually increase exercise to treat her exercise induced shortness of breath. Will continue to monitor closely.   - CBC with Differential/Platelet  2.  Other fatigue Michelle Kennedy will gradually increase activities. We will check CBC today. Michelle Kennedy does feel that her weight is causing her energy to be lower than it should be. Fatigue may be related to obesity, depression or many other causes. Labs will be  ordered, and in the meanwhile, Michelle Kennedy will focus on self care including making healthy food choices, increasing physical activity and focusing on stress reduction.   - CBC with Differential/Platelet  3. Essential hypertension Michelle Kennedy will continue taking amlodipine and lisinopril. She is working on healthy weight loss and exercise to improve blood pressure control. We will watch for signs of hypotension as she continues her lifestyle modifications.  4. OSA (obstructive sleep apnea) Michelle Kennedy will continue use of her CPAP. Intensive lifestyle modifications are the first line treatment for this issue. We discussed several lifestyle modifications today and she will continue to work on diet, exercise and weight loss efforts. We will continue to monitor. Orders and follow up as documented in patient record.    5. Gastroesophageal reflux disease without esophagitis Intensive lifestyle modifications are the first line treatment for this issue. Michelle Kennedy will continue pantoprazole. We discussed several lifestyle modifications today and she will continue to work on diet, exercise and weight loss efforts. Orders and follow up as documented in patient record.   Counseling If a person has gastroesophageal reflux disease (GERD), food and stomach acid move back up into the esophagus and cause symptoms or problems such as damage to the esophagus. Anti-reflux measures include: raising the head of the bed, avoiding tight clothing or belts, avoiding eating late at night, not lying down shortly after mealtime, and achieving weight loss. Avoid ASA, NSAID's, caffeine, alcohol, and tobacco.  OTC Pepcid and/or Tums are often very helpful for as needed use.  However, for persisting chronic or daily symptoms, stronger medications like Omeprazole may be needed. You may need to avoid foods and drinks such as: Coffee and tea (with or without caffeine). Drinks that contain alcohol. Energy drinks and sports drinks. Bubbly  (carbonated) drinks or sodas. Chocolate and cocoa. Peppermint and mint flavorings. Garlic and onions. Horseradish. Spicy and acidic foods. These include peppers, chili powder, curry powder, vinegar, hot sauces, and BBQ sauce. Citrus fruit juices and citrus fruits, such as oranges, lemons, and limes. Tomato-based foods. These include red sauce, chili, salsa, and pizza with red sauce. Fried and fatty foods. These include donuts, french fries, potato chips, and high-fat dressings. High-fat meats. These include hot dogs, rib eye steak, sausage, ham, and bacon.   6. Other hyperlipidemia Cardiovascular risk and specific lipid/LDL goals reviewed.  Juliauna will continue rosuvastatin. We will check Lipid panel today. We discussed several lifestyle modifications today and Anzleigh will continue to work on diet, exercise and weight loss efforts. Orders and follow up as documented in patient record.   Counseling Intensive lifestyle modifications are the first line treatment for this issue. Dietary changes: Increase soluble fiber. Decrease simple carbohydrates. Exercise changes: Moderate to vigorous-intensity aerobic activity 150 minutes per week if tolerated. Lipid-lowering medications: see documented in medical record.  - Lipid Panel With LDL/HDL Ratio  7. Pre-diabetes We will check A1C and insulin today. Talena will check blood sugar 3 times per day. Dariann will continue to work on weight loss, exercise, and decreasing simple carbohydrates to help decrease the risk of diabetes.   - Hemoglobin A1c - Insulin, random  8. B12 deficiency The diagnosis was reviewed with the patient. We will check Vitamin B 12 today. Counseling provided today, see below. We  will continue to monitor. Orders and follow up as documented in patient record.  Counseling The body needs vitamin B12: to make red blood cells; to make DNA; and to help the nerves work properly so they can carry messages from the brain to the  body.  The main causes of vitamin B12 deficiency include dietary deficiency, digestive diseases, pernicious anemia, and having a surgery in which part of the stomach or small intestine is removed.  Certain medicines can make it harder for the body to absorb vitamin B12. These medicines include: heartburn medications; some antibiotics; some medications used to treat diabetes, gout, and high cholesterol.  In some cases, there are no symptoms of this condition. If the condition leads to anemia or nerve damage, various symptoms can occur, such as weakness or fatigue, shortness of breath, and numbness or tingling in your hands and feet.   Treatment:  May include taking vitamin B12 supplements.  Avoid alcohol.  Eat lots of healthy foods that contain vitamin B12: Beef, pork, chicken, [place], and organ meats, such as liver.  Seafood: This includes clams, rainbow trout, salmon, tuna, and haddock. Eggs.  Cereal and dairy products that are fortified: This means that vitamin B12 has been added to the food.   - Vitamin B12  9. Chronic diastolic CHF (congestive heart failure) (HCC) Michelle Kennedy will follow up with her primary care provider and cardiologist. .   10. Vitamin D deficiency Low Vitamin D level contributes to fatigue and are associated with obesity, breast, and colon cancer. We will check Vitamin D today and Michelle Kennedy will follow-up for routine testing of Vitamin D, at least 2-3 times per year to avoid over-replacement.  - VITAMIN D 25 Hydroxy (Vit-D Deficiency, Fractures)  11. Depression screen Michelle Kennedy had a negative depression screening. Depression is commonly associated with obesity and often results in emotional eating behaviors. We will monitor this closely and work on CBT to help improve the non-hunger eating patterns. Referral to Psychology may be required if no improvement is seen as she continues in our clinic.   12. Class 3 severe obesity with serious comorbidity and body mass index (BMI) of  40.0 to 44.9 in adult, unspecified obesity type Michelle Kennedy) Michelle Kennedy is currently in the action stage of change and her goal is to continue with weight loss efforts. I recommend Michelle Kennedy begin the structured treatment plan as follows:  She has agreed to the Category 4 Plan.  Michelle Kennedy will continue meal planning and she will continue intentional eating. We reviewed labs from 07/20/2021 BMP, Lipid panel and TSH.   Exercise goals:  Michelle Kennedy will continue to exercise with Silver Sneakers.     Behavioral modification strategies: increasing lean protein intake, decreasing simple carbohydrates, increasing vegetables, increasing water intake, decreasing eating out, no skipping meals, meal planning and cooking strategies, keeping healthy foods in the home, and planning for success.  She was informed of the importance of frequent follow-up visits to maximize her success with intensive lifestyle modifications for her multiple health conditions. She was informed we would discuss her lab results at her next visit unless there is a critical issue that needs to be addressed sooner. Devra agreed to keep her next visit at the agreed upon time to discuss these results.  Objective:   Blood pressure (!) 147/87, pulse 74, temperature (!) 97.4 F (36.3 C), height '5\' 6"'$  (1.676 m), weight 275 lb (124.7 kg), SpO2 98 %. Body mass index is 44.39 kg/m.  EKG: Normal sinus rhythm, rate unable to obtain.  Indirect Calorimeter completed today shows a VO2 of 346 and a REE of 2390.  Her calculated basal metabolic rate is 7416 thus her basal metabolic rate is better than expected.  General: Cooperative, alert, well developed, in no acute distress. HEENT: Conjunctivae and lids unremarkable. Cardiovascular: Regular rhythm.  Lungs: Normal work of breathing. Neurologic: No focal deficits.   Lab Results  Component Value Date   CREATININE 0.78 04/27/2021   BUN 14 04/27/2021   NA 141 04/27/2021   K 4.2 04/27/2021   CL 104 04/27/2021    CO2 30 04/27/2021   Lab Results  Component Value Date   ALT 27 07/20/2021   AST 17 07/20/2021   ALKPHOS 76 07/20/2021   BILITOT 0.4 07/20/2021   Lab Results  Component Value Date   HGBA1C 5.8 (H) 07/28/2021   HGBA1C 6.0 (A) 04/22/2021   HGBA1C 6.2 10/26/2020   HGBA1C 5.8 (H) 03/01/2020   HGBA1C 6.1 (H) 08/23/2019   Lab Results  Component Value Date   INSULIN 25.6 (H) 07/28/2021   INSULIN 21.8 09/20/2016   Lab Results  Component Value Date   TSH 0.36 07/20/2021   Lab Results  Component Value Date   CHOL 187 07/28/2021   HDL 64 07/28/2021   LDLCALC 102 (H) 07/28/2021   LDLDIRECT 117.0 02/26/2019   TRIG 122 07/28/2021   CHOLHDL 3 06/28/2020   Lab Results  Component Value Date   WBC 5.1 07/28/2021   HGB 14.3 07/28/2021   HCT 45.0 07/28/2021   MCV 76 (L) 07/28/2021   PLT 255 07/28/2021   Lab Results  Component Value Date   IRON 71 06/23/2020   TIBC 294 06/23/2020   FERRITIN 94.6 06/23/2020   Attestation Statements:   Reviewed by clinician on day of visit: allergies, medications, problem list, medical history, surgical history, family history, social history, and previous encounter notes.  I, Lizbeth Bark, RMA, am acting as Location manager for [employer], DO.  I have reviewed the above documentation for accuracy and completeness, and I agree with the above. Jearld Lesch, DO

## 2021-08-02 ENCOUNTER — Other Ambulatory Visit: Payer: Self-pay | Admitting: *Deleted

## 2021-08-02 MED ORDER — ANASTROZOLE 1 MG PO TABS: 1.0000 mg | ORAL_TABLET | Freq: Every day | ORAL | 3 refills | Status: DC

## 2021-08-03 ENCOUNTER — Other Ambulatory Visit: Payer: Self-pay | Admitting: *Deleted

## 2021-08-03 MED ORDER — ANASTROZOLE 1 MG PO TABS: 1.0000 mg | ORAL_TABLET | Freq: Every day | ORAL | 3 refills | Status: DC

## 2021-08-04 ENCOUNTER — Telehealth: Payer: Self-pay | Admitting: Physical Medicine and Rehabilitation

## 2021-08-04 NOTE — Telephone Encounter (Signed)
Pt called requesting a call back to set an appt for back injection. Pt is a past pt. Phone number is [phone]. ?

## 2021-08-09 ENCOUNTER — Other Ambulatory Visit: Payer: Self-pay

## 2021-08-09 ENCOUNTER — Encounter: Payer: Self-pay | Admitting: Internal Medicine

## 2021-08-09 ENCOUNTER — Ambulatory Visit (INDEPENDENT_AMBULATORY_CARE_PROVIDER_SITE_OTHER): Payer: Medicare Other | Admitting: Internal Medicine

## 2021-08-09 VITALS — BP 140/80 | HR 63 | Ht 66.0 in | Wt 275.0 lb

## 2021-08-09 DIAGNOSIS — E785 Hyperlipidemia, unspecified: Secondary | ICD-10-CM | POA: Diagnosis not present

## 2021-08-09 NOTE — Progress Notes (Signed)
? ?Cardiology Office Note ? ? ?Date:  08/09/2021  ? ?ID:  Michelle Kennedy, DOB August 04, 1955, MRN [id number] ? ?PCP:  Debbrah Alar, NP  ?Cardiologist:   Dorris Carnes, MD  ? ? ?Patient presents for cardiac risk assessment    ?  ?History of Present Illness: ?Michelle Kennedy is a 66 y.o. female with a history of HTN, reacitve air dz, obesity    I see her husband in clinic   I have not seen her as a formal visit ?Last fall I recomm checking a lipomed panel     ? ?Last fall she had a lipmed panel ?IN Dec she had a Ca score CT   Ca score was 0    Mild aortic atherosclerosis noted  She was started on Crestor  ? ?She comes in today with husband    Both are seeing Dr Owens Shark for weight loss    ?She denies CP  Breathing is OK  No dizziness   No palpitatoins  ?Does about 30 min treadmill and 30 min foot peddles per day    ? ? ? ? ?Current Meds  ?Medication Sig  ? albuterol (PROVENTIL) (5 MG/ML) 0.5% nebulizer solution Take 0.5 mLs (2.5 mg total) by nebulization every 6 (six) hours as needed for wheezing or shortness of breath.  ? albuterol (VENTOLIN HFA) 108 (90 Base) MCG/ACT inhaler USE 2 INHALATIONS ORALLY   EVERY 6 HOURS AS NEEDED FORWHEEZING OR SHORTNESS OF   BREATH.  ? amLODipine (NORVASC) 5 MG tablet Take 1 tablet (5 mg total) by mouth daily.  ? anastrozole (ARIMIDEX) 1 MG tablet Take 1 tablet (1 mg total) by mouth daily.  ? CALCIUM PO Take 1 tablet by mouth every evening.  ? cholecalciferol (VITAMIN D3) 25 MCG (1000 UNIT) tablet Take 1,000 Units by mouth every evening.   ? clopidogrel (PLAVIX) 75 MG tablet TAKE 1 TABLET BY MOUTH     DAILY  ? ergocalciferol (VITAMIN D2) 1.25 MG (50000 UT) capsule Take 1 capsule by mouth daily.  ? furosemide (LASIX) 20 MG tablet TAKE 1 TABLET(20 MG) BY MOUTH DAILY AS NEEDED FOR SWELLING  ? gabapentin (NEURONTIN) 100 MG capsule Take 2 capsules (200 mg total) by mouth 3 (three) times daily.  ? GEMTESA 75 MG TABS Take 1 tablet by mouth daily.  ? glucose blood (ONETOUCH VERIO) test strip  Use as instructed to check blood sugar 2-4 times daily  ? latanoprost (XALATAN) 0.005 % ophthalmic solution 1 drop at bedtime.  ? lisinopril (ZESTRIL) 20 MG tablet TAKE 2 TABLETS DAILY  ? loratadine-pseudoephedrine (CLARITIN-D 24-HOUR) 10-240 MG 24 hr tablet Take 1 tablet by mouth daily.  ? meclizine (ANTIVERT) 25 MG tablet Take 1 tablet (25 mg total) by mouth 3 (three) times daily as needed for dizziness.  ? metFORMIN (GLUCOPHAGE) 500 MG tablet Take 1 tablet (500 mg total) by mouth 2 (two) times daily with a meal.  ? methocarbamol (ROBAXIN) 500 MG tablet TAKE 1 TABLET(500 MG) BY MOUTH EVERY 8 HOURS AS NEEDED FOR MUSCLE SPASMS  ? Multiple Vitamin (MULTIVITAMIN ADULT) TABS Take 1 tablet by mouth daily.  ? Multiple Vitamins-Minerals (OCUVITE PO) Take 1 tablet by mouth daily.  ? Omega-3 Fatty Acids (FISH OIL) 1000 MG CAPS Take 1 capsule by mouth every evening.   ? pantoprazole (PROTONIX) 40 MG tablet Take 1 tablet (40 mg total) by mouth daily.  ? rosuvastatin (CRESTOR) 5 MG tablet Take 1 tablet (5 mg total) by mouth daily. Please make overdue appt with  Dr. Harrington Challenger before anymore refills. Thank you 1st attempt  ? SYNTHROID 200 MCG tablet Take 200 mcg by mouth daily.  ? traMADol (ULTRAM) 50 MG tablet Take 1 tablet (50 mg total) by mouth every 6 (six) hours as needed.  ? WIXELA INHUB 500-50 MCG/ACT AEPB INHALE 1 PUFF INTO THE LUNGS TWICE DAILY  ? ? ? ?Allergies:   Bee venom, Codeine, Contrast media [iodinated contrast media], Gadolinium derivatives, Iodine, Latex, Lidocaine, Penicillins, Pentazocine lactate, Shellfish allergy, Aspirin, Erythromycin, Symbicort [budesonide-formoterol fumarate], Betadine [povidone iodine], Compazine [prochlorperazine maleate], and Sulfonamide derivatives  ? ?Past Medical History:  ?Diagnosis Date  ? Abnormally small mouth   ? Achilles tendon disorder, right   ? Anemia   ? Anxiety   ? Arthritis   ? hips and knees  ? Asthma   ? daily inhaler, prn inhaler and neb.  ? Asthma due to environmental  allergies   ? Breast cancer ([place]) 01/2014  ? left  ? Chest pain   ? CHF (congestive heart failure) ([place])   ? COPD (chronic obstructive pulmonary disease) ([place])   ? Dental bridge present   ? upper front and lower right  ? Dental crowns present   ? x 3  ? Depression   ? Esophageal spasm   ? reports since her chemo and breast surgery  she developed esophageal spasms and reports this is in the past has casue her 02 to desat in the  70s , denies sycnope in relation to this , does report hx of vertigo as well   ? Family history of anesthesia complication   ? twin brother aspirated and died on OR table, per pt.  ? Fibromyalgia   ? Gallbladder disease   ? Gallstones   ? GERD (gastroesophageal reflux disease)   ? Glaucoma   ? H/O blood clots   ? had blood to  PICC line   ? History of gastric ulcer   ? as a teenager  ? History of seizure age 23  ? as a reaction to Penicillin - no seizures since  ? History of stomach ulcers   ? History of thyroid cancer   ? s/p thyroidectomy  ? HTN (hypertension)   ? Hyperlipidemia   ? Hypothyroidism   ? Joint pain   ? Left knee injury   ? Liver disorder   ? seen when she had her hysterectomy , reports she was told it was  " small lesion" ; but asymptomatic   ? Lymphedema   ? left arm  ? Migraines   ? Multiple food allergies   ? Obesity   ? OSA (obstructive sleep apnea) 02/16/2016  ? CPAP  ? Palpitations   ? reports no longer experiences  ? Personal history of chemotherapy 2015  ? Personal history of radiation therapy 2015  ? Left  ? Prediabetes   ? Rheumatoid arthritis ([place])   ? SOB (shortness of breath)   ? Swelling of both ankles   ? Tinnitus   ? UTI (lower urinary tract infection) 02/17/2014  ? Vertigo   ? last episode  was 2 weeks ago   ? Vitamin D deficiency   ? Wears contact lenses   ? left eye only   ? Wears hearing aid in both ears   ? ? ?Past Surgical History:  ?Procedure Laterality Date  ? ABDOMINAL HYSTERECTOMY  2004  ? complete  ? ACHILLES TENDON REPAIR Right   ? APPENDECTOMY   2004  ? BREAST EXCISIONAL BIOPSY    ?  BREAST LUMPECTOMY Left   ? 2015  ? BREAST LUMPECTOMY WITH NEEDLE LOCALIZATION AND AXILLARY SENTINEL LYMPH NODE BX Left 02/23/2014  ? Procedure: BREAST LUMPECTOMY WITH NEEDLE LOCALIZATION AND AXILLARY SENTINEL LYMPH NODE BIOPSY;  Surgeon: Excell Seltzer, MD;  Location: [facility];  Service: General;  Laterality: Left;  ? BREAST SURGERY  2011  ? left breast biposy  ? CHOLECYSTECTOMY  1990  ? COLONOSCOPY W/ POLYPECTOMY  06/2009  ? EYE SURGERY Right 2013  ? exc. warts from underneath eyelid  ? INCONTINENCE SURGERY  2004  ? KNEE ARTHROSCOPY Bilateral   ? x 6 each knee  ? LIGAMENT REPAIR Right   ? thumb/wrist  ? ORIF TOE FRACTURE Right   ? great toe  ? PORTACATH PLACEMENT N/A 03/24/2014  ? Procedure: INSERTION PORT-A-CATH;  Surgeon: Excell Seltzer, MD;  Location: [facility];  Service: General;  Laterality: N/A;; removed   ? THYROIDECTOMY  2000  ? TONSILLECTOMY AND ADENOIDECTOMY  2000  ? TOTAL KNEE ARTHROPLASTY Left 12/03/2017  ? Procedure: LEFT TOTAL KNEE ARTHROPLASTY;  Surgeon: Gaynelle Arabian, MD;  Location: [facility];  Service: Orthopedics;  Laterality: Left;  ? TUMOR EXCISION    ? from thoracic spine  ? ? ? ?Social History:  The patient  reports that she has never smoked. She has never used smokeless tobacco. She reports that she does not drink alcohol and does not use drugs.  ? ?Family History:  The patient's family history includes Alcohol abuse in her father and mother; Alcoholism in her father and mother; Anxiety disorder in her mother; Arthritis in her maternal grandmother and mother; Asthma in an other family member; Bipolar disorder in her mother; Breast cancer in her maternal aunt and sister; Breast cancer (age of onset: 36) in her mother; COPD in an other family member; Cancer in her mother; Cystic kidney disease in her father; Depression in her mother; Diabetes in her father and paternal grandmother; Drug abuse in her mother; Eating disorder  in her mother; Heart disease in an other family member; Hyperlipidemia in her father and mother; Hypertension in her father and mother; Kidney disease in her father; Liver disease in her father; Lung cancer

## 2021-08-09 NOTE — Patient Instructions (Signed)
Medication Instructions:  ?Your physician recommends that you continue on your current medications as directed. Please refer to the Current Medication list given to you today. ? ?*If you need a refill on your cardiac medications before your next appointment, please call your pharmacy* ? ? ?Lab Work: ?none ?If you have labs (blood work) drawn today and your tests are completely normal, you will receive your results only by: ?MyChart Message (if you have MyChart) OR ?A paper copy in the mail ?If you have any lab test that is abnormal or we need to change your treatment, we will call you to review the results. ? ? ?Testing/Procedures: ?none ? ? ?Follow-Up: ?At [facility], you and your health needs are our priority.  As part of our continuing mission to provide you with exceptional heart care, we have created designated Provider Care Teams.  These Care Teams include your primary Cardiologist (physician) and Advanced Practice Providers (APPs -  Physician Assistants and Nurse Practitioners) who all work together to provide you with the care you need, when you need it. ? ?We recommend signing up for the patient portal called "MyChart".  Sign up information is provided on this After Visit Summary.  MyChart is used to connect with patients for Virtual Visits (Telemedicine).  Patients are able to view lab/test results, encounter notes, upcoming appointments, etc.  Non-urgent messages can be sent to your provider as well.   ?To learn more about what you can do with MyChart, go to [email or web address].   ? ?Your next appointment:   ?1 year(s) ? ?The format for your next appointment:   ?In Person ? ?Provider:  Dr Dorris Carnes  ?

## 2021-08-10 ENCOUNTER — Encounter: Payer: Self-pay | Admitting: Physical Medicine and Rehabilitation

## 2021-08-10 ENCOUNTER — Ambulatory Visit (INDEPENDENT_AMBULATORY_CARE_PROVIDER_SITE_OTHER): Payer: Medicare Other | Admitting: Physical Medicine and Rehabilitation

## 2021-08-10 VITALS — BP 143/83 | HR 76

## 2021-08-10 DIAGNOSIS — M545 Low back pain, unspecified: Secondary | ICD-10-CM

## 2021-08-10 DIAGNOSIS — G8929 Other chronic pain: Secondary | ICD-10-CM | POA: Diagnosis not present

## 2021-08-10 DIAGNOSIS — M461 Sacroiliitis, not elsewhere classified: Secondary | ICD-10-CM

## 2021-08-10 DIAGNOSIS — M7918 Myalgia, other site: Secondary | ICD-10-CM

## 2021-08-10 MED ORDER — TRAMADOL HCL 50 MG PO TABS: 50.0000 mg | ORAL_TABLET | Freq: Three times a day (TID) | ORAL | 0 refills | Status: DC | PRN

## 2021-08-10 MED ORDER — METHOCARBAMOL 500 MG PO TABS
500.0000 mg | ORAL_TABLET | Freq: Three times a day (TID) | ORAL | 0 refills | Status: DC
Start: 2021-08-10 — End: 2022-03-07

## 2021-08-10 NOTE — Progress Notes (Signed)
Pt state lower back pain that travels to her buttock and to her right side. Pt state any movement makes the pain worse. Pt state twisting or lifting makes the pain worse. Pt state she takes pain meds to help ease her pain. Pt state has of RFA pt state it helped for a little while. ? ?Numeric Pain Rating Scale and Functional Assessment ?Average Pain 9 ?Pain Right Now 5 ?My pain is constant, dull, tingling, and aching ?Pain is worse with: walking, bending, sitting, standing, and some activites ?Pain improves with: medication and injections ? ? ?In the last MONTH (on 0-10 scale) has pain interfered with the following? ? ?1. General activity like being  able to carry out your everyday physical activities such as walking, climbing stairs, carrying groceries, or moving a chair?  ?Rating(6) ? ?2. Relation with others like being able to carry out your usual social activities and roles such as  activities at home, at work and in your community. ?Rating(7) ? ?3. Enjoyment of life such that you have  been bothered by emotional problems such as feeling anxious, depressed or irritable?  ?Rating(8) ? ?

## 2021-08-10 NOTE — Progress Notes (Signed)
Michelle Kennedy - 66 y.o. female MRN [id number]  Date of birth: [birth year]  Office Visit Note: Visit Date: 08/10/2021 PCP: Sandford Craze, NP Referred by: Sandford Craze, NP  Subjective: Chief Complaint  Patient presents with   Lower Back - Pain   Right Leg - Pain   HPI: Michelle Kennedy is a 66 y.o. female who comes in today for evaluation of chronic, worsening and severe right-sided lower back pain radiating to buttock region.  Patient reports pain has been ongoing for several years and is exacerbated by movement and activity.  Patient states her pain is severe when attempting to get into car or attempting to walk upstairs.  Patient describes her pain as a constant dull and sore sensation, currently rates as 8 out of 10.  Patient reports some relief of pain with home exercise regimen, yoga, hot baths and use of medications as needed. Patient has attended formal physical therapy at [facility] and reports some relief of pain. Patient also states she is in a weight management program and attends silver sneakers classes multiple times a week.  Patient states she does take Robaxin and Tramadol at home as needed.  Patient's lumbar spine x-ray images from 2021 exhibits mild degenerative changes to bilateral sacroiliac joints.  Patient had bilateral L5, S1 and S2 radiofrequency ablation performed in our office in 2022 and reports significant and sustained pain relief, greater than 50% for approximately 7 months. Patient states her pain has been gradually increasing over the last several months. Patient also reports financial/insurance issues and would like to continue conservative therapies before repeating radiofrequency ablation. Patient denies focal weakness, numbness and tingling. Patient denies recent trauma or falls.   Review of Systems  Musculoskeletal:  Positive for back pain.  Neurological:  Negative for tingling, sensory change, focal  weakness and weakness.  All other systems reviewed and are negative. Otherwise per HPI.  Assessment & Plan: Visit Diagnoses:    ICD-10-CM   1. Sacroiliitis, not elsewhere classified (HCC)  M46.1 Ambulatory referral to Physical Therapy    2. Buttock pain  M79.18 Ambulatory referral to Physical Therapy    3. Chronic right-sided low back pain without sciatica  M54.50 Ambulatory referral to Physical Therapy   G89.29        Plan: Findings:  Chronic, worsening and severe right sided lower back pain radiating to buttock region. Patient continues to have severe pain despite good conservative therapies such as formal physical therapy, home exercise regimen, rest and use of medications. Patients clinical presentation and exam are consistent with sacroilitis. Positive fortin finger sign noted bilaterally upon exam. I talked with patient in detail today regarding plan of care and possible treatments. We did discuss repeating bilateral L5, S1 and S2 radiofrequency ablation, however patient would like to hold on this procedure and has requested to re-group with physical therapy. Patient states she had to pay for radiofrequency ablation out of pocket last year due to insurance issues and would like to leave this procedure as last resort. I did place a referral today for physical therapy at [facility] and will have patient follow up with [place] after several sessions for re-evaluation. I also wrote prescription for Robaxin and short course of Tramadol today. Patient instructed to let us know if her pain worsens or changes in nature. No red flag symptoms noted upon exam today.    Meds & Orders:  Meds ordered this encounter  Medications   traMADol (ULTRAM)  50 MG tablet    Sig: Take 1 tablet (50 mg total) by mouth every 8 (eight) hours as needed for moderate pain or severe pain.    Dispense:  21 tablet    Refill:  0    Order Specific Question:   Supervising Provider    Answer:   Thalia Party    methocarbamol (ROBAXIN) 500 MG tablet    Sig: Take 1 tablet (500 mg total) by mouth 3 (three) times daily.    Dispense:  90 tablet    Refill:  0    Order Specific Question:   Supervising Provider    Answer:   Tyrell Antonio [[id number]]    Orders Placed This Encounter  Procedures   Ambulatory referral to Physical Therapy    Follow-up: Return if symptoms worsen or fail to improve.   Procedures: No procedures performed      Clinical History: No specialty comments available.   She reports that she has never smoked. She has never used smokeless tobacco.  Recent Labs    10/26/20 1002 04/22/21 1425 07/28/21 0904  HGBA1C 6.2 6.0* 5.8*    Objective:  VS:  HT:    WT:   BMI:     BP: (!) 143/83  HR:76bpm  TEMP: ( )  RESP:  Physical Exam Vitals and nursing note reviewed.  HENT:     Head: Normocephalic and atraumatic.     Right Ear: External ear normal.     Left Ear: External ear normal.     Nose: Nose normal.     Mouth/Throat:     Mouth: Mucous membranes are moist.  Eyes:     Extraocular Movements: Extraocular movements intact.  Cardiovascular:     Rate and Rhythm: Normal rate.     Pulses: Normal pulses.  Pulmonary:     Effort: Pulmonary effort is normal.  Abdominal:     General: Abdomen is flat. There is no distension.  Musculoskeletal:        General: Tenderness present.     Cervical back: Normal range of motion.     Comments: Pt rises from seated position to standing without difficulty. Good lumbar range of motion. Strong distal strength without clonus, no pain upon palpation of greater trochanters. Sensation intact bilaterally. Walks independently, gait steady. Positive fortin finger sign bilaterally.    Skin:    General: Skin is warm and dry.     Capillary Refill: Capillary refill takes less than 2 seconds.  Neurological:     General: No focal deficit present.     Mental Status: She is alert and oriented to person, place, and time.  Psychiatric:         Mood and Affect: Mood normal.        Behavior: Behavior normal.    Ortho Exam  Imaging: No results found.  Past Medical/Family/Surgical/Social History: Medications & Allergies reviewed per EMR, new medications updated. Patient Active Problem List   Diagnosis Date Noted   Acute left-sided low back pain with left-sided sciatica 04/27/2021   Nondisplaced fracture of middle phalanx of left lesser toe(s), initial encounter for closed fracture 09/17/2020   Chronic diastolic CHF (congestive heart failure) (HCC) 08/23/2019   Retinal artery occlusion 08/22/2019   History of total left knee replacement 12/07/2017   OA (osteoarthritis) of knee 12/03/2017   Obesity 01/01/2017   Essential hypertension 10/18/2016   Constipation 10/04/2016   Vitamin D deficiency 10/04/2016   Prediabetes 10/04/2016   Obesity, Class III, BMI 40-49.9 (morbid obesity) (  HCC) 09/20/2016   OSA (obstructive sleep apnea) 02/16/2016   Malignant neoplasm of thyroid gland (HCC) 10/11/2015   De Quervain's tenosynovitis, bilateral 09/15/2015   Trigger finger, acquired 06/22/2015   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/10/2015   Postmenopausal osteoporosis 12/08/2014   Preventative health care 12/01/2014   DVT (deep venous thrombosis) (HCC) 08/28/2014   Lymphedema of arm 08/28/2014   Mucositis due to chemotherapy 06/02/2014   Menopausal syndrome (hot flashes) 12/31/2013   Breast cancer of upper-outer quadrant of left female breast (HCC) 12/26/2013   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   Asthma due to environmental allergies    Breast cancer (HCC) 01/2014   left   Chest pain    CHF (congestive heart failure) (HCC)    COPD (chronic obstructive pulmonary disease) (HCC)    Dental bridge present    upper front and lower right   Dental crowns present    x 3   Depression    Esophageal spasm    reports since her chemo and breast surgery  she developed esophageal spasms and reports this is in the past has casue her 02 to desat in the  70s , denies sycnope in relation to this , does report hx of vertigo as well    Family history of anesthesia complication    twin brother aspirated and died on OR table, per pt.   Fibromyalgia    Gallbladder disease    Gallstones    GERD (gastroesophageal reflux disease)    Glaucoma    H/O blood clots    had blood to  PICC line    History of gastric ulcer    as a teenager   History of seizure age 32   as a reaction to Penicillin - no seizures since   History of stomach ulcers    History of thyroid cancer    s/p thyroidectomy   HTN (hypertension)    Hyperlipidemia    Hypothyroidism    Joint pain    Left knee injury    Liver disorder    seen when she had her hysterectomy , reports she was told it was  " small lesion" ; but asymptomatic    Lymphedema    left arm   Migraines    Multiple food allergies    Obesity    OSA (obstructive sleep apnea) 02/16/2016   CPAP   Palpitations    reports no longer experiences   Personal history of chemotherapy 2015   Personal history of radiation therapy 2015   Left   Prediabetes    Rheumatoid arthritis (HCC)    SOB (shortness of breath)    Swelling of both ankles    Tinnitus    UTI (lower urinary tract infection) 02/17/2014   Vertigo    last episode  was 2 weeks ago    Vitamin D deficiency    Wears contact lenses    left eye  only    Wears hearing aid in both ears    Family History  Problem Relation Age of  Onset   Alcohol abuse Mother    Arthritis Mother    Hypertension Mother    Bipolar disorder Mother    Breast cancer Mother 76       unconfirmed   Lung cancer Mother 69       smoker   Hyperlipidemia Mother    Stroke Mother    Cancer Mother    Depression Mother    Anxiety disorder Mother    Alcoholism Mother    Drug abuse Mother    Eating disorder Mother    Obesity Mother    Alcohol abuse Father    Hyperlipidemia Father    Kidney disease Father    Diabetes Father    Hypertension Father    Cystic kidney disease Father    Thyroid disease Father    Liver disease Father    Alcoholism Father    Lung cancer Father    Thyroid cancer Sister 21       type?; currently 64   Other Sister        ovarian tumor @ 72; TAH/BSO   Breast cancer Sister    Thyroid cancer Brother        dx 30s; currently 15   Breast cancer Maternal Aunt        dx 49s; deceased 34   Thyroid cancer Paternal Aunt        All 3 paternal aunts with thyroid ca in 30s/40s   Lung cancer Paternal Aunt        2 of 3 paternal aunts with lung cancer   Arthritis Maternal Grandmother    Diabetes Paternal Grandmother    Heart disease Other    COPD Other    Asthma Other    Past Surgical History:  Procedure Laterality Date   ABDOMINAL HYSTERECTOMY  2004   complete   ACHILLES TENDON REPAIR Right    APPENDECTOMY  2004   BREAST EXCISIONAL BIOPSY     BREAST LUMPECTOMY Left    2015   BREAST LUMPECTOMY WITH NEEDLE LOCALIZATION AND AXILLARY SENTINEL LYMPH NODE BX Left 02/23/2014   Procedure: BREAST LUMPECTOMY WITH NEEDLE LOCALIZATION AND AXILLARY SENTINEL LYMPH NODE BIOPSY;  Surgeon: Glenna Fellows, MD;  Location: [facility];  Service: General;  Laterality: Left;   BREAST SURGERY  2011   left breast biposy   CHOLECYSTECTOMY  1990   COLONOSCOPY W/ POLYPECTOMY  06/2009   EYE SURGERY Right 2013   exc. warts from underneath eyelid   INCONTINENCE SURGERY  2004   KNEE ARTHROSCOPY Bilateral    x 6 each knee    LIGAMENT REPAIR Right    thumb/wrist   ORIF TOE FRACTURE Right    great toe   PORTACATH PLACEMENT N/A 03/24/2014   Procedure: INSERTION PORT-A-CATH;  Surgeon: Glenna Fellows, MD;  Location: [facility];  Service: General;  Laterality: N/A;; removed    THYROIDECTOMY  2000   TONSILLECTOMY AND ADENOIDECTOMY  2000   TOTAL KNEE ARTHROPLASTY Left 12/03/2017   Procedure: LEFT TOTAL KNEE ARTHROPLASTY;  Surgeon: Ollen Gross, MD;  Location: [facility];  Service: Orthopedics;  Laterality: Left;   TUMOR EXCISION     from thoracic spine   Social History   Occupational History   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

## 2021-08-11 ENCOUNTER — Other Ambulatory Visit: Payer: Self-pay

## 2021-08-11 ENCOUNTER — Encounter (INDEPENDENT_AMBULATORY_CARE_PROVIDER_SITE_OTHER): Payer: Self-pay | Admitting: Bariatrics

## 2021-08-11 ENCOUNTER — Ambulatory Visit (INDEPENDENT_AMBULATORY_CARE_PROVIDER_SITE_OTHER): Payer: Medicare Other | Admitting: Bariatrics

## 2021-08-11 VITALS — BP 121/84 | HR 74 | Temp 97.5°F | Ht 66.0 in | Wt 270.0 lb

## 2021-08-11 DIAGNOSIS — E66813 Obesity, class 3: Secondary | ICD-10-CM

## 2021-08-11 DIAGNOSIS — E669 Obesity, unspecified: Secondary | ICD-10-CM | POA: Diagnosis not present

## 2021-08-11 DIAGNOSIS — Z6841 Body Mass Index (BMI) 40.0 and over, adult: Secondary | ICD-10-CM | POA: Diagnosis not present

## 2021-08-11 DIAGNOSIS — R7303 Prediabetes: Secondary | ICD-10-CM

## 2021-08-11 DIAGNOSIS — I10 Essential (primary) hypertension: Secondary | ICD-10-CM | POA: Diagnosis not present

## 2021-08-11 MED ORDER — LISINOPRIL 20 MG PO TABS: 40.0000 mg | ORAL_TABLET | Freq: Every day | ORAL | 1 refills | Status: DC

## 2021-08-12 ENCOUNTER — Other Ambulatory Visit: Payer: Self-pay

## 2021-08-12 MED ORDER — LISINOPRIL 20 MG PO TABS: 40.0000 mg | ORAL_TABLET | Freq: Every day | ORAL | 1 refills | Status: DC

## 2021-08-15 NOTE — Progress Notes (Signed)
? ? ? ?Chief Complaint:  ? ?OBESITY ?Michelle Kennedy is here to discuss her progress with her obesity treatment plan along with follow-up of her obesity related diagnoses. Michelle Kennedy is on the Category 4  plan and states she is following her eating plan approximately 80% of the time. Michelle Kennedy states she is doing step aerobics and yoga for 30-45 minutes 5 times per week. ? ?Today's visit was #: 2 ?Starting weight: 275 lbs ?Starting date: 3/9/20232 ?Today's weight: 270 lbs ?Today's date: 08/11/2021 ?Total lbs lost to date: 5 lbs ?Total lbs lost since last in-office visit: 5 lbs ? ?Interim History: Michelle Kennedy is down 5 lbs since her last visit. She has not been hungry but drinking more water.  ? ?Subjective:  ? ?1. Pre-diabetes ?Michelle Kennedy's last A1C was 5.8. Her insulin was 25.6. ? ?2. Essential hypertension ?Michelle Kennedy's blood pressure is controlled. Her last blood pressure was 147/87. ? ?Assessment/Plan:  ? ?1. Pre-diabetes ?Michelle Kennedy will keep carbohydrates low. Handout for protein and healthy foods was provided today. She will continue to work on weight loss, exercise, and decreasing simple carbohydrates to help decrease the risk of diabetes.  ? ?2. Essential hypertension ?Michelle Kennedy will continue taking her medications. She is working on healthy weight loss and exercise to improve blood pressure control. We will watch for signs of hypotension as she continues her lifestyle modifications. ? ?3. Obesity, current BMI 43.6 ?Michelle Kennedy is currently in the action stage of change. As such, her goal is to continue with weight loss efforts. She has agreed to keeping a food journal and adhering to recommended goals of 1800 calories and 100-120 grams of protein.  ? ?Michelle Kennedy will continue meal planning. We reviewed labs from 07/28/2021 Lipid, Vitamin D, B 12, CBC, A1C and insulin today.  ? ?Exercise goals:  Michelle Kennedy notes lower back pain She will decrease exercise and will get to physical therapy.  ? ?Behavioral modification strategies: increasing lean  protein intake, decreasing simple carbohydrates, increasing vegetables, increasing water intake, decreasing eating out, no skipping meals, meal planning and cooking strategies, keeping healthy foods in the home, and planning for success. ? ?Michelle Kennedy has agreed to follow-up with our clinic in 2 weeks. She was informed of the importance of frequent follow-up visits to maximize her success with intensive lifestyle modifications for her multiple health conditions.  ? ?Objective:  ? ?Blood pressure 121/84, pulse 74, temperature (!) 97.5 ?F (36.4 ?C), height '5\' 6"'$  (1.676 m), weight 270 lb (122.5 kg), SpO2 97 %. ?Body mass index is 43.58 kg/m?. ? ?General: Cooperative, alert, well developed, in no acute distress. ?HEENT: Conjunctivae and lids unremarkable. ?Cardiovascular: Regular rhythm.  ?Lungs: Normal work of breathing. ?Neurologic: No focal deficits.  ? ?Lab Results  ?Component Value Date  ? CREATININE 0.78 04/27/2021  ? BUN 14 04/27/2021  ? NA 141 04/27/2021  ? K 4.2 04/27/2021  ? CL 104 04/27/2021  ? CO2 30 04/27/2021  ? ?Lab Results  ?Component Value Date  ? ALT 27 07/20/2021  ? AST 17 07/20/2021  ? ALKPHOS 76 07/20/2021  ? BILITOT 0.4 07/20/2021  ? ?Lab Results  ?Component Value Date  ? HGBA1C 5.8 (H) 07/28/2021  ? HGBA1C 6.0 (A) 04/22/2021  ? HGBA1C 6.2 10/26/2020  ? HGBA1C 5.8 (H) 03/01/2020  ? HGBA1C 6.1 (H) 08/23/2019  ? ?Lab Results  ?Component Value Date  ? INSULIN 25.6 (H) 07/28/2021  ? INSULIN 21.8 09/20/2016  ? ?Lab Results  ?Component Value Date  ? TSH 0.36 07/20/2021  ? ?Lab Results  ?Component Value Date  ?  CHOL 187 07/28/2021  ? HDL 64 07/28/2021  ? LDLCALC 102 (H) 07/28/2021  ? LDLDIRECT 117.0 02/26/2019  ? TRIG 122 07/28/2021  ? CHOLHDL 3 06/28/2020  ? ?Lab Results  ?Component Value Date  ? VD25OH 53.4 07/28/2021  ? VD25OH 41.4 09/20/2016  ? ?Lab Results  ?Component Value Date  ? WBC 5.1 07/28/2021  ? HGB 14.3 07/28/2021  ? HCT 45.0 07/28/2021  ? MCV 76 (L) 07/28/2021  ? PLT 255 07/28/2021  ? ?Lab  Results  ?Component Value Date  ? IRON 71 06/23/2020  ? TIBC 294 06/23/2020  ? FERRITIN 94.6 06/23/2020  ? ?Attestation Statements:  ? ?Reviewed by clinician on day of visit: allergies, medications, problem list, medical history, surgical history, family history, social history, and previous encounter notes. ? ? ?I, Lizbeth Bark, RMA, am acting as transcriptionist for [employer], DO. ? ?I have reviewed the above documentation for accuracy and completeness, and I agree with the above. Jearld Lesch, DO ? ?

## 2021-08-16 ENCOUNTER — Encounter (INDEPENDENT_AMBULATORY_CARE_PROVIDER_SITE_OTHER): Payer: Self-pay | Admitting: Bariatrics

## 2021-08-21 ENCOUNTER — Encounter: Payer: Self-pay | Admitting: Hematology and Oncology

## 2021-08-22 ENCOUNTER — Encounter: Payer: Self-pay | Admitting: Family

## 2021-08-23 MED ORDER — CLOPIDOGREL BISULFATE 75 MG PO TABS: 75.0000 mg | ORAL_TABLET | Freq: Every day | ORAL | 1 refills | Status: DC

## 2021-08-24 ENCOUNTER — Telehealth: Payer: Self-pay | Admitting: Orthopaedic Surgery

## 2021-08-24 NOTE — Telephone Encounter (Signed)
Pt called requesting a call back for an update for right knee gel injection. Please call pt about this matter at [phone]. Pt is Michelle Kennedy pt. ?

## 2021-08-25 NOTE — Telephone Encounter (Signed)
Talked with patient about gel injections.  Appts. Have been scheduled. ?

## 2021-08-29 ENCOUNTER — Encounter (INDEPENDENT_AMBULATORY_CARE_PROVIDER_SITE_OTHER): Payer: Self-pay | Admitting: Nurse Practitioner

## 2021-08-29 ENCOUNTER — Encounter: Payer: Self-pay | Admitting: Orthopaedic Surgery

## 2021-08-29 ENCOUNTER — Ambulatory Visit (INDEPENDENT_AMBULATORY_CARE_PROVIDER_SITE_OTHER): Payer: Medicare Other | Admitting: Nurse Practitioner

## 2021-08-29 VITALS — BP 121/77 | HR 77 | Temp 97.4°F | Ht 66.0 in | Wt 268.0 lb

## 2021-08-29 DIAGNOSIS — E669 Obesity, unspecified: Secondary | ICD-10-CM | POA: Diagnosis not present

## 2021-08-29 DIAGNOSIS — E559 Vitamin D deficiency, unspecified: Secondary | ICD-10-CM

## 2021-08-29 DIAGNOSIS — I10 Essential (primary) hypertension: Secondary | ICD-10-CM

## 2021-08-29 DIAGNOSIS — Z6841 Body Mass Index (BMI) 40.0 and over, adult: Secondary | ICD-10-CM

## 2021-08-29 DIAGNOSIS — R7303 Prediabetes: Secondary | ICD-10-CM | POA: Diagnosis not present

## 2021-08-30 ENCOUNTER — Encounter: Payer: Self-pay | Admitting: Physical Therapy

## 2021-08-30 ENCOUNTER — Telehealth: Payer: Self-pay

## 2021-08-30 ENCOUNTER — Ambulatory Visit: Payer: Medicare Other | Attending: Physical Medicine and Rehabilitation | Admitting: Physical Therapy

## 2021-08-30 DIAGNOSIS — M545 Low back pain, unspecified: Secondary | ICD-10-CM | POA: Diagnosis not present

## 2021-08-30 DIAGNOSIS — R252 Cramp and spasm: Secondary | ICD-10-CM | POA: Diagnosis present

## 2021-08-30 DIAGNOSIS — M5459 Other low back pain: Secondary | ICD-10-CM | POA: Diagnosis present

## 2021-08-30 DIAGNOSIS — M7918 Myalgia, other site: Secondary | ICD-10-CM | POA: Insufficient documentation

## 2021-08-30 DIAGNOSIS — M461 Sacroiliitis, not elsewhere classified: Secondary | ICD-10-CM | POA: Insufficient documentation

## 2021-08-30 DIAGNOSIS — R293 Abnormal posture: Secondary | ICD-10-CM | POA: Insufficient documentation

## 2021-08-30 DIAGNOSIS — G8929 Other chronic pain: Secondary | ICD-10-CM | POA: Insufficient documentation

## 2021-08-30 NOTE — Telephone Encounter (Signed)
Approved for Orthovisc, right knee. ?[place] ?Medicare deductible has been met. ?Secondary insurance, [employer] will pick up remaining eligible expenses at 100%. ?No Co-pay ?No PA required ? ?Appt.09/01/2021 with Dr. Durward Fortes ?

## 2021-08-30 NOTE — Therapy (Signed)
[facility] [place] [facility] ?[address] ?[place], [place], [address] ?Phone: [phone]   Fax:  [phone] ? ?Physical Therapy Evaluation ? ?Patient Details  ?Name: Michelle Kennedy ?MRN: [id number] ?Date of Birth: 23-Apr-1956 ?Referring Provider (PT): Barnet Pall NP/ Joni Fears ? ? ?Encounter Date: 08/30/2021 ? ? PT End of Session - 08/30/21 1018   ? ? Visit Number 1   ? Date for PT Re-Evaluation 10/25/21   ? Authorization Type MCR   ? Progress Note Due on Visit 10   ? PT Start Time 1019   ? PT Stop Time 1100   ? PT Time Calculation (min) 41 min   ? Activity Tolerance Patient tolerated treatment well   ? Behavior During Therapy [facility] for tasks assessed/performed   ? ?  ?  ? ?  ? ? ?Past Medical History:  ?Diagnosis Date  ? Abnormally small mouth   ? Achilles tendon disorder, right   ? Anemia   ? Anxiety   ? Arthritis   ? hips and knees  ? Asthma   ? daily inhaler, prn inhaler and neb.  ? Asthma due to environmental allergies   ? Breast cancer ([place]) 01/2014  ? left  ? Chest pain   ? CHF (congestive heart failure) ([place])   ? COPD (chronic obstructive pulmonary disease) ([place])   ? Dental bridge present   ? upper front and lower right  ? Dental crowns present   ? x 3  ? Depression   ? Esophageal spasm   ? reports since her chemo and breast surgery  she developed esophageal spasms and reports this is in the past has casue her 02 to desat in the  70s , denies sycnope in relation to this , does report hx of vertigo as well   ? Family history of anesthesia complication   ? twin brother aspirated and died on OR table, per pt.  ? Fibromyalgia   ? Gallbladder disease   ? Gallstones   ? GERD (gastroesophageal reflux disease)   ? Glaucoma   ? H/O blood clots   ? had blood to  PICC line   ? History of gastric ulcer   ? as a teenager  ? History of seizure age 7  ? as a reaction to Penicillin - no seizures since  ? History of stomach ulcers   ? History of thyroid cancer   ?  s/p thyroidectomy  ? HTN (hypertension)   ? Hyperlipidemia   ? Hypothyroidism   ? Joint pain   ? Left knee injury   ? Liver disorder   ? seen when she had her hysterectomy , reports she was told it was  " small lesion" ; but asymptomatic   ? Lymphedema   ? left arm  ? Migraines   ? Multiple food allergies   ? Obesity   ? OSA (obstructive sleep apnea) 02/16/2016  ? CPAP  ? Palpitations   ? reports no longer experiences  ? Personal history of chemotherapy 2015  ? Personal history of radiation therapy 2015  ? Left  ? Prediabetes   ? Rheumatoid arthritis ([place])   ? SOB (shortness of breath)   ? Swelling of both ankles   ? Tinnitus   ? UTI (lower urinary tract infection) 02/17/2014  ? Vertigo   ? last episode  was 2 weeks ago   ? Vitamin D deficiency   ? Wears contact lenses   ? left  eye only   ? Wears hearing aid in both ears   ? ? ?Past Surgical History:  ?Procedure Laterality Date  ? ABDOMINAL HYSTERECTOMY  2004  ? complete  ? ACHILLES TENDON REPAIR Right   ? APPENDECTOMY  2004  ? BREAST EXCISIONAL BIOPSY    ? BREAST LUMPECTOMY Left   ? 2015  ? BREAST LUMPECTOMY WITH NEEDLE LOCALIZATION AND AXILLARY SENTINEL LYMPH NODE BX Left 02/23/2014  ? Procedure: BREAST LUMPECTOMY WITH NEEDLE LOCALIZATION AND AXILLARY SENTINEL LYMPH NODE BIOPSY;  Surgeon: Excell Seltzer, MD;  Location: [facility];  Service: General;  Laterality: Left;  ? BREAST SURGERY  2011  ? left breast biposy  ? CHOLECYSTECTOMY  1990  ? COLONOSCOPY W/ POLYPECTOMY  06/2009  ? EYE SURGERY Right 2013  ? exc. warts from underneath eyelid  ? INCONTINENCE SURGERY  2004  ? KNEE ARTHROSCOPY Bilateral   ? x 6 each knee  ? LIGAMENT REPAIR Right   ? thumb/wrist  ? ORIF TOE FRACTURE Right   ? great toe  ? PORTACATH PLACEMENT N/A 03/24/2014  ? Procedure: INSERTION PORT-A-CATH;  Surgeon: Excell Seltzer, MD;  Location: [facility];  Service: General;  Laterality: N/A;; removed   ? THYROIDECTOMY  2000  ? TONSILLECTOMY AND ADENOIDECTOMY  2000  ?  TOTAL KNEE ARTHROPLASTY Left 12/03/2017  ? Procedure: LEFT TOTAL KNEE ARTHROPLASTY;  Surgeon: Gaynelle Arabian, MD;  Location: [facility];  Service: Orthopedics;  Laterality: Left;  ? TUMOR EXCISION    ? from thoracic spine  ? ? ?There were no vitals filed for this visit. ? ? ? Subjective Assessment - 08/30/21 1021   ? ? Subjective Long h/o of LBP. Had ablation bil 6 months ago on low back and now left side is bothering her more. She has spasms that start in her LB and run up to her ribs. Muscle relaxors help. Sleep is disturbed.Must sleep on right side. She maintains good posture. Once tight cannot lean to the left.   ? Pertinent History History of lumpectomy with SLNB on 02/23/2014, Hx DVT, left TKR, Bil CTR, right knee pain (TKR to be scheduled); vertigo   ? How long can you sit comfortably? 15 min   ? How long can you stand comfortably? 15 min   ? How long can you walk comfortably? 15 min   ? Patient Stated Goals more mobility with less pain   ? Currently in Pain? Yes   ? Pain Score 5    ? Pain Location Back   ? Pain Orientation Lower;Mid   ? Pain Descriptors / Indicators Tightness;Spasm;Sharp   ? Pain Type Chronic pain   ? Pain Onset More than a month ago   ? Pain Frequency Constant   ? Aggravating Factors  prolonged sitting, standing and walking   ? Pain Relieving Factors heat, meds   ? ?  ?  ? ?  ? ? ? ? ? [facility] PT Assessment - 08/30/21 0001   ? ?  ? Assessment  ? Medical Diagnosis sacroillitis; chr Rt LBP; buttock pain   ? Referring Provider (PT) Barnet Pall NP/ Joni Fears   ? Onset Date/Surgical Date 08/02/21   ? Hand Dominance Left   ?  ? Precautions  ? Precautions Other (comment)   hx DVT in the UE, Lumpectomy and radiation due to cancer  ?  ? Restrictions  ? Weight Bearing Restrictions No   ?  ? Balance Screen  ? Has the patient fallen in the  past 6 months No   ? Has the patient had a decrease in activity level because of a fear of falling?  No   ? Is the patient reluctant to leave their home because of a  fear of falling?  No   ?  ? Home Environment  ? Living Environment Private residence   ? Living Arrangements Spouse/significant other   ? Type of Home House   ? Home Access Level entry   ? Home Layout One level   ?  ? Prior Function  ? Level of Independence Independent   ? Vocation Retired   ? Leisure knit/sew   ?  ? Observation/Other Assessments  ? Focus on Therapeutic Outcomes (FOTO)  53 (predicted 37)   ?  ? Posture/Postural Control  ? Posture/Postural Control Postural limitations   ? Postural Limitations Rounded Shoulders   ? Posture Comments left lateral shift, even pelvic landmarks   ?  ? ROM / Strength  ? AROM / PROM / Strength AROM;Strength   ?  ? AROM  ? AROM Assessment Site Lumbar   ? Lumbar Flexion full   ? Lumbar Extension full   ? Lumbar - Right Side Bend full   ? Lumbar - Left Side Bend tight   painful on left  ? Lumbar - Right Rotation full   ? Lumbar - Left Rotation full   ?  ? Strength  ? Overall Strength Comments B LE grossly 5/5, except R hip ADD/ext 4+/5;  bil hip flexion in sitting 2+/5; in SDLY 5/5   ?  ? Flexibility  ? Soft Tissue Assessment /Muscle Length yes   ? Hamstrings marked bil   ? Piriformis WNL   ? Quadratus Lumborum left tight with spasm; tight hip flexors bil   ?  ? Palpation  ? Palpation comment bil gluteus min/med tight and with TPs present; tight left QL with pain/spasm   ? ?  ?  ? ?  ? ? ? ? ? ? ? ? ? ? ? ?Flowsheet Row Outpatient Rehab from 07/28/2019 in [facility]  ?Lymphedema Life Impact Scale Total Score 57.35 %  ? ?  ? ? ?Objective measurements completed on examination: See above findings.  ? ? ? ? ? ? ? ? ? ? ? ? ? ? PT Education - 08/30/21 1059   ? ? Education Details HEP; POC   ? Person(s) Educated Patient   ? Methods Explanation;Demonstration;Handout   ? Comprehension Verbalized understanding;Returned demonstration   ? ?  ?  ? ?  ? ? ? PT Short Term Goals - 08/30/21 1229   ? ?  ? PT SHORT TERM GOAL #2  ? Title Pt to demonstrate normal  spinal alignment with no lateral shift   ? Time 3   ? Period Weeks   ? Status New   ? Target Date 09/20/21   ? ?  ?  ? ?  ? ? ? ? PT Long Term Goals - 08/30/21 1228   ? ?  ? PT LONG TERM GOAL #1  ? T

## 2021-08-30 NOTE — Patient Instructions (Signed)
Access Code: [email or web address] ?URL: [email or web address] ?Date: 08/30/2021 ?Prepared by: Almyra Free ? ?Exercises ?- Lateral Shift Correction at Wall  - 3 x daily - 7 x weekly - 2 sets - 10 reps - 5-10 sec  hold ?- Seated Hamstring Stretch with Chair  - 2 x daily - 7 x weekly - 1 sets - 3 reps - 30-60 sec hold ?- Seated Hip Flexor Stretch  - 2 x daily - 7 x weekly - 1 sets - 3 reps - 30-60 sec hold ?

## 2021-08-31 ENCOUNTER — Encounter: Payer: Self-pay | Admitting: Family

## 2021-08-31 NOTE — Progress Notes (Signed)
? ? ? ?Chief Complaint:  ? ?OBESITY ?Michelle Kennedy is here to discuss her progress with her obesity treatment plan along with follow-up of her obesity related diagnoses. Michelle Kennedy is on the Category 4 Plan and keeping a food journal and adhering to recommended goals of 1800 calories and 100-120 grams of protein and states she is following her eating plan approximately 75% of the time. Michelle Kennedy states she is doing Chief of Staff and yoga for 45 minutes 3 times per week. ? ?Today's visit was #: 3 ?Starting weight: 275 lbs ?Starting date: 07/28/2021 ?Today's weight: 268 lbs ?Today's date: 08/29/2021 ?Total lbs lost to date: 7 lbs ?Total lbs lost since last in-office visit: 2 lbs ? ?Interim History: Michelle Kennedy has done well with weight loss since her last visit. She is going to [employer] next week. Her husband does all the cooking at home. He is a patient here also. She is meeting protein and calorie goals. She is drinking 1 cup of coffee daily.  ? ?Subjective:  ? ?1. Vitamin D deficiency ?Michelle Kennedy is currently taking Vitamin D 5.000 IU over the counter. She denies side effects nausea, vomiting, and muscle weakness.  ? ?2. Pre-diabetes ?Michelle Kennedy's last A1C was 5.8 and insulin was 25.6. She is currently taking Metformin 500 mg twice daily. She denies side effects.  ? ?3. Essential hypertension ?Michelle Kennedy's blood pressure looks good today. Her last blood pressure was 121/84. ? ?Assessment/Plan:  ? ?1. Vitamin D deficiency ?Low Vitamin D level contributes to fatigue and are associated with obesity, breast, and colon cancer. Michelle Kennedy agrees to continue to take over the counter Vitamin D 800 IU every week and will follow-up for routine testing of Vitamin D, at least 2-3 times per year to avoid over-replacement. We will continue to monitor. She will decrease back to 800 IU daily per patient discussion.  ? ?2. Pre-diabetes ?Michelle Kennedy will continue medications as directed. She will continue to work on weight loss, exercise, and decreasing  simple carbohydrates to help decrease the risk of diabetes.  ? ?3. Essential hypertension ?Michelle Kennedy will continue to follow up with her primary care provider. She will continue medication as directed. is working on healthy weight loss and exercise to improve blood pressure control. We will watch for signs of hypotension as she continues her lifestyle modifications. ? ?4. Obesity, current BMI 43.3 ?Michelle Kennedy is currently in the action stage of change. As such, her goal is to continue with weight loss efforts. She has agreed to the Category 4 Plan.  ? ?Exercise goals:  As is.   Michelle Kennedy's pool is opening soon at home. ? ?Behavioral modification strategies: increasing lean protein intake, increasing water intake, no skipping meals, travel eating strategies, and planning for success. ? ?Michelle Kennedy has agreed to follow-up with our clinic in 2 weeks. She was informed of the importance of frequent follow-up visits to maximize her success with intensive lifestyle modifications for her multiple health conditions.  ? ?Objective:  ? ?Blood pressure 121/77, pulse 77, temperature (!) 97.4 ?F (36.3 ?C), height '5\' 6"'$  (1.676 m), weight 268 lb (121.6 kg), SpO2 95 %. ?Body mass index is 43.26 kg/m?. ? ?General: Cooperative, alert, well developed, in no acute distress. ?HEENT: Conjunctivae and lids unremarkable. ?Cardiovascular: Regular rhythm.  ?Lungs: Normal work of breathing. ?Neurologic: No focal deficits.  ? ?Lab Results  ?Component Value Date  ? CREATININE 0.78 04/27/2021  ? BUN 14 04/27/2021  ? NA 141 04/27/2021  ? K 4.2 04/27/2021  ? CL 104 04/27/2021  ? CO2 30 04/27/2021  ? ?  Lab Results  ?Component Value Date  ? ALT 27 07/20/2021  ? AST 17 07/20/2021  ? ALKPHOS 76 07/20/2021  ? BILITOT 0.4 07/20/2021  ? ?Lab Results  ?Component Value Date  ? HGBA1C 5.8 (H) 07/28/2021  ? HGBA1C 6.0 (A) 04/22/2021  ? HGBA1C 6.2 10/26/2020  ? HGBA1C 5.8 (H) 03/01/2020  ? HGBA1C 6.1 (H) 08/23/2019  ? ?Lab Results  ?Component Value Date  ? INSULIN 25.6  (H) 07/28/2021  ? INSULIN 21.8 09/20/2016  ? ?Lab Results  ?Component Value Date  ? TSH 0.36 07/20/2021  ? ?Lab Results  ?Component Value Date  ? CHOL 187 07/28/2021  ? HDL 64 07/28/2021  ? LDLCALC 102 (H) 07/28/2021  ? LDLDIRECT 117.0 02/26/2019  ? TRIG 122 07/28/2021  ? CHOLHDL 3 06/28/2020  ? ?Lab Results  ?Component Value Date  ? VD25OH 53.4 07/28/2021  ? VD25OH 41.4 09/20/2016  ? ?Lab Results  ?Component Value Date  ? WBC 5.1 07/28/2021  ? HGB 14.3 07/28/2021  ? HCT 45.0 07/28/2021  ? MCV 76 (L) 07/28/2021  ? PLT 255 07/28/2021  ? ?Lab Results  ?Component Value Date  ? IRON 71 06/23/2020  ? TIBC 294 06/23/2020  ? FERRITIN 94.6 06/23/2020  ? ?Attestation Statements:  ? ?Reviewed by clinician on day of visit: allergies, medications, problem list, medical history, surgical history, family history, social history, and previous encounter notes. ? ?I, Lizbeth Bark, RMA, am acting as Location manager for [employer], FNP. ? ?I have reviewed the above documentation for accuracy and completeness, and I agree with the above. [employer], FNP  ?

## 2021-09-01 ENCOUNTER — Encounter: Payer: Self-pay | Admitting: Physical Therapy

## 2021-09-01 ENCOUNTER — Encounter: Payer: Self-pay | Admitting: Orthopaedic Surgery

## 2021-09-01 ENCOUNTER — Ambulatory Visit (INDEPENDENT_AMBULATORY_CARE_PROVIDER_SITE_OTHER): Payer: Medicare Other | Admitting: Orthopaedic Surgery

## 2021-09-01 ENCOUNTER — Ambulatory Visit: Payer: Medicare Other | Admitting: Physical Therapy

## 2021-09-01 DIAGNOSIS — M5459 Other low back pain: Secondary | ICD-10-CM | POA: Diagnosis not present

## 2021-09-01 DIAGNOSIS — M1711 Unilateral primary osteoarthritis, right knee: Secondary | ICD-10-CM | POA: Diagnosis not present

## 2021-09-01 DIAGNOSIS — R293 Abnormal posture: Secondary | ICD-10-CM

## 2021-09-01 DIAGNOSIS — R252 Cramp and spasm: Secondary | ICD-10-CM

## 2021-09-01 MED ORDER — HYALURONAN 30 MG/2ML IX SOSY
30.0000 mg | PREFILLED_SYRINGE | INTRA_ARTICULAR | Status: AC | PRN
  Administered 2021-09-01: 30 mg via INTRA_ARTICULAR

## 2021-09-01 NOTE — Progress Notes (Signed)
? ?Office Visit Note ?  ?Patient: Michelle Kennedy           ?Date of Birth: September 18, 1955           ?MRN: [id number] ?Visit Date: 09/01/2021 ?             ?Requested by: Michelle Alar, NP ?[facility] ?[address] ?[place],  [place] [address] ?PCP: Michelle Alar, NP ? ? ?Assessment & Plan: ?Visit Diagnoses:  ?1. Primary osteoarthritis of right knee   ? ? ?Plan: Michelle Kennedy has been approved for viscosupplementation.  I injected Orthovisc into the medial compartment of her right knee and we will see her back weekly to complete the series of 3.  She is allergic to iodine and used only alcohol ? ?Follow-Up Instructions: Return in about 1 week (around 09/08/2021).  ? ?Orders:  ?No orders of the defined types were placed in this encounter. ? ?No orders of the defined types were placed in this encounter. ? ? ? ? Procedures: ?Large Joint Inj: R knee on 09/01/2021 1:35 PM ?Indications: pain and joint swelling ?Details: 25 G 1.5 in needle ? ?Arthrogram: No ? ?Medications: 30 mg Hyaluronan 30 MG/2ML ?Outcome: tolerated well, no immediate complications ?Procedure, treatment alternatives, risks and benefits explained, specific risks discussed. Consent was given by the patient. Immediately prior to procedure a time out was called to verify the correct patient, procedure, equipment, support staff and site/side marked as required. Patient was prepped and draped in the usual sterile fashion.  ? ? ? ? ?Clinical Data: ?No additional findings. ? ? ?Subjective: ?Chief Complaint  ?Patient presents with  ? Right Knee - Follow-up  ?  Orthovisc #1  ?Patient presents today for the first Orthovisc injection into his right knee. ? ?HPI ? ?Review of Systems ? ? ?Objective: ?Vital Signs: There were no vitals taken for this visit. ? ?Physical Exam ? ?Ortho Exam large knees.  BMI is 43 there may be a very small effusion.  Mostly medial joint pain.  Full extension flexed about 95 degrees ? ?Specialty Comments:  ?Lumbar x-ray  12/17/2019 ? ?X-Rays show mild-moderate L2-3 DDD.  There is moderate L4-5 and L5-S1  ?facet DJD.  SI joints have mild degenerative change.  Hip joints are  ?unremarkable.  No sign of compression fracture or neoplasm. ? ?Imaging: ?No results found. ? ? ?PMFS History: ?Patient Active Problem List  ? Diagnosis Date Noted  ? Acute left-sided low back pain with left-sided sciatica 04/27/2021  ? Nondisplaced fracture of middle phalanx of left lesser toe(s), initial encounter for closed fracture 09/17/2020  ? Chronic diastolic CHF (congestive heart failure) ([place]) 08/23/2019  ? Retinal artery occlusion 08/22/2019  ? History of total left knee replacement 12/07/2017  ? OA (osteoarthritis) of knee 12/03/2017  ? Obesity 01/01/2017  ? Essential hypertension 10/18/2016  ? Constipation 10/04/2016  ? Vitamin D deficiency 10/04/2016  ? Prediabetes 10/04/2016  ? Obesity, Class III, BMI 40-49.9 (morbid obesity) ([place]) 09/20/2016  ? OSA (obstructive sleep apnea) 02/16/2016  ? Malignant neoplasm of thyroid gland ([place]) 10/11/2015  ? De Quervain's tenosynovitis, bilateral 09/15/2015  ? Trigger finger, acquired 06/22/2015  ? Chemotherapy-induced peripheral neuropathy ([place]) 05/10/2015  ? Postmenopausal osteoporosis 12/08/2014  ? Preventative health care 12/01/2014  ? DVT (deep venous thrombosis) ([place]) 08/28/2014  ? Lymphedema of arm 08/28/2014  ? Mucositis due to chemotherapy 06/02/2014  ? Menopausal syndrome (hot flashes) 12/31/2013  ? Breast cancer of upper-outer quadrant of left female breast ([place]) 12/26/2013  ?  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 Gingivitis 12/05/2013  ? Seasonal allergies 09/24/2013  ? Hyperlipidemia 06/02/2011  ? Migraine 06/02/2011  ? Tinnitus of both ears 02/14/2011  ? Fibromyalgia 05/10/2010  ? Asthma 03/22/2010  ? 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 GERD 12/20/2009  ? Depression 12/01/2009  ? ?Past Medical History:  ?Diagnosis Date  ? Abnormally small mouth   ? Achilles tendon  disorder, right   ? Anemia   ? Anxiety   ? Arthritis   ? hips and knees  ? Asthma   ? daily inhaler, prn inhaler and neb.  ? Asthma due to environmental allergies   ? Breast cancer ([place]) 01/2014  ? left  ? Chest pain   ? CHF (congestive heart failure) ([place])   ? COPD (chronic obstructive pulmonary disease) ([place])   ? Dental bridge present   ? upper front and lower right  ? Dental crowns present   ? x 3  ? Depression   ? Esophageal spasm   ? reports since her chemo and breast surgery  she developed esophageal spasms and reports this is in the past has casue her 02 to desat in the  70s , denies sycnope in relation to this , does report hx of vertigo as well   ? Family history of anesthesia complication   ? twin brother aspirated and died on OR table, per pt.  ? Fibromyalgia   ? Gallbladder disease   ? Gallstones   ? GERD (gastroesophageal reflux disease)   ? Glaucoma   ? H/O blood clots   ? had blood to  PICC line   ? History of gastric ulcer   ? as a teenager  ? History of seizure age 2  ? as a reaction to Penicillin - no seizures since  ? History of stomach ulcers   ? History of thyroid cancer   ? s/p thyroidectomy  ? HTN (hypertension)   ? Hyperlipidemia   ? Hypothyroidism   ? Joint pain   ? Left knee injury   ? Liver disorder   ? seen when she had her hysterectomy , reports she was told it was  " small lesion" ; but asymptomatic   ? Lymphedema   ? left arm  ? Migraines   ? Multiple food allergies   ? Obesity   ? OSA (obstructive sleep apnea) 02/16/2016  ? CPAP  ? Palpitations   ? reports no longer experiences  ? Personal history of chemotherapy 2015  ? Personal history of radiation therapy 2015  ? Left  ? Prediabetes   ? Rheumatoid arthritis ([facility])   ? SOB (shortness of breath)   ? Swelling of both ankles   ? Tinnitus   ? UTI (lower urinary tract infection) 02/17/2014  ? Vertigo   ? last episode  was 2 weeks ago   ? Vitamin D deficiency   ? Wears contact lenses   ? left eye only   ? Wears hearing aid in both ears    ?  ?Family History  ?Problem Relation Age of Onset  ? Alcohol abuse Mother   ? Arthritis Mother   ? Hypertension Mother   ? Bipolar disorder Mother   ? Breast cancer Mother 47  ?     unconfirmed  ? Lung cancer Mother 15  ?     smoker  ? Hyperlipidemia Mother   ? Stroke Mother   ? Cancer Mother   ? Depression Mother   ?  Anxiety disorder Mother   ? Alcoholism Mother   ? Drug abuse Mother   ? Eating disorder Mother   ? Obesity Mother   ? Alcohol abuse Father   ? Hyperlipidemia Father   ? Kidney disease Father   ? Diabetes Father   ? Hypertension Father   ? Cystic kidney disease Father   ? Thyroid disease Father   ? Liver disease Father   ? Alcoholism Father   ? Lung cancer Father   ? Thyroid cancer Sister 21  ?     type?; currently 44  ? Other Sister   ?     ovarian tumor @ 29; TAH/BSO  ? Breast cancer Sister   ? Thyroid cancer Brother   ?     dx 23s; currently 44  ? Breast cancer Maternal Aunt   ?     dx 64s; deceased 37  ? Thyroid cancer Paternal Aunt   ?     All 3 paternal aunts with thyroid ca in 30s/40s  ? Lung cancer Paternal Aunt   ?     2 of 3 paternal aunts with lung cancer  ? Arthritis Maternal Grandmother   ? Diabetes Paternal Grandmother   ? Heart disease Other   ? COPD Other   ? Asthma Other   ?  ?Past Surgical History:  ?Procedure Laterality Date  ? ABDOMINAL HYSTERECTOMY  2004  ? complete  ? ACHILLES TENDON REPAIR Right   ? APPENDECTOMY  2004  ? BREAST EXCISIONAL BIOPSY    ? BREAST LUMPECTOMY Left   ? 2015  ? BREAST LUMPECTOMY WITH NEEDLE LOCALIZATION AND AXILLARY SENTINEL LYMPH NODE BX Left 02/23/2014  ? Procedure: BREAST LUMPECTOMY WITH NEEDLE LOCALIZATION AND AXILLARY SENTINEL LYMPH NODE BIOPSY;  Surgeon: Excell Seltzer, MD;  Location: [facility];  Service: General;  Laterality: Left;  ? BREAST SURGERY  2011  ? left breast biposy  ? CHOLECYSTECTOMY  1990  ? COLONOSCOPY W/ POLYPECTOMY  06/2009  ? EYE SURGERY Right 2013  ? exc. warts from underneath eyelid  ? INCONTINENCE SURGERY  2004   ? KNEE ARTHROSCOPY Bilateral   ? x 6 each knee  ? LIGAMENT REPAIR Right   ? thumb/wrist  ? ORIF TOE FRACTURE Right   ? great toe  ? PORTACATH PLACEMENT N/A 03/24/2014  ? Procedure: INSERTION PORT-A-CATH;  Surgeon: Silverio Decamp

## 2021-09-01 NOTE — Therapy (Signed)
[facility] ?[address] ?[place], [place], [address] ?Phone: [phone]   Fax:  [phone] ? ?Physical Therapy Treatment ? ?Patient Details  ?Name: Michelle Kennedy ?MRN: [id number] ?Date of Birth: 25-Jun-1955 ?Referring Provider (PT): Barnet Pall NP/ Joni Fears ? ? ?Encounter Date: 09/01/2021 ? ? PT End of Session - 09/01/21 0843   ? ? Visit Number 2   ? Date for PT Re-Evaluation 10/11/21   ? Authorization Type MCR   ? Progress Note Due on Visit 10   ? PT Start Time 0845   ? PT Stop Time 0930   ? PT Time Calculation (min) 45 min   ? Activity Tolerance Patient tolerated treatment well   ? Behavior During Therapy [facility] for tasks assessed/performed   ? ?  ?  ? ?  ? ? ?Past Medical History:  ?Diagnosis Date  ? Abnormally small mouth   ? Achilles tendon disorder, right   ? Anemia   ? Anxiety   ? Arthritis   ? hips and knees  ? Asthma   ? daily inhaler, prn inhaler and neb.  ? Asthma due to environmental allergies   ? Breast cancer ([place]) 01/2014  ? left  ? Chest pain   ? CHF (congestive heart failure) ([place])   ? COPD (chronic obstructive pulmonary disease) ([place])   ? Dental bridge present   ? upper front and lower right  ? Dental crowns present   ? x 3  ? Depression   ? Esophageal spasm   ? reports since her chemo and breast surgery  she developed esophageal spasms and reports this is in the past has casue her 02 to desat in the  70s , denies sycnope in relation to this , does report hx of vertigo as well   ? Family history of anesthesia complication   ? twin brother aspirated and died on OR table, per pt.  ? Fibromyalgia   ? Gallbladder disease   ? Gallstones   ? GERD (gastroesophageal reflux disease)   ? Glaucoma   ? H/O blood clots   ? had blood to  PICC line   ? History of gastric ulcer   ? as a teenager  ? History of seizure age 44  ? as a reaction to Penicillin - no seizures since  ? History of stomach ulcers   ? History of thyroid cancer   ?  s/p thyroidectomy  ? HTN (hypertension)   ? Hyperlipidemia   ? Hypothyroidism   ? Joint pain   ? Left knee injury   ? Liver disorder   ? seen when she had her hysterectomy , reports she was told it was  " small lesion" ; but asymptomatic   ? Lymphedema   ? left arm  ? Migraines   ? Multiple food allergies   ? Obesity   ? OSA (obstructive sleep apnea) 02/16/2016  ? CPAP  ? Palpitations   ? reports no longer experiences  ? Personal history of chemotherapy 2015  ? Personal history of radiation therapy 2015  ? Left  ? Prediabetes   ? Rheumatoid arthritis ([place])   ? SOB (shortness of breath)   ? Swelling of both ankles   ? Tinnitus   ? UTI (lower urinary tract infection) 02/17/2014  ? Vertigo   ? last episode  was 2 weeks ago   ? Vitamin D deficiency   ? Wears contact lenses   ? left  eye only   ? Wears hearing aid in both ears   ? ? ?Past Surgical History:  ?Procedure Laterality Date  ? ABDOMINAL HYSTERECTOMY  2004  ? complete  ? ACHILLES TENDON REPAIR Right   ? APPENDECTOMY  2004  ? BREAST EXCISIONAL BIOPSY    ? BREAST LUMPECTOMY Left   ? 2015  ? BREAST LUMPECTOMY WITH NEEDLE LOCALIZATION AND AXILLARY SENTINEL LYMPH NODE BX Left 02/23/2014  ? Procedure: BREAST LUMPECTOMY WITH NEEDLE LOCALIZATION AND AXILLARY SENTINEL LYMPH NODE BIOPSY;  Surgeon: Excell Seltzer, MD;  Location: [facility];  Service: General;  Laterality: Left;  ? BREAST SURGERY  2011  ? left breast biposy  ? CHOLECYSTECTOMY  1990  ? COLONOSCOPY W/ POLYPECTOMY  06/2009  ? EYE SURGERY Right 2013  ? exc. warts from underneath eyelid  ? INCONTINENCE SURGERY  2004  ? KNEE ARTHROSCOPY Bilateral   ? x 6 each knee  ? LIGAMENT REPAIR Right   ? thumb/wrist  ? ORIF TOE FRACTURE Right   ? great toe  ? PORTACATH PLACEMENT N/A 03/24/2014  ? Procedure: INSERTION PORT-A-CATH;  Surgeon: Excell Seltzer, MD;  Location: [facility];  Service: General;  Laterality: N/A;; removed   ? THYROIDECTOMY  2000  ? TONSILLECTOMY AND ADENOIDECTOMY  2000  ?  TOTAL KNEE ARTHROPLASTY Left 12/03/2017  ? Procedure: LEFT TOTAL KNEE ARTHROPLASTY;  Surgeon: Gaynelle Arabian, MD;  Location: [facility];  Service: Orthopedics;  Laterality: Left;  ? TUMOR EXCISION    ? from thoracic spine  ? ? ?There were no vitals filed for this visit. ? ? Subjective Assessment - 09/01/21 0845   ? ? Subjective Pt. reports she walked 3 miles this morning, so already warmed up.  She didn't take muscle relaxer this morning but did take darvacet early this morning due to pain.  She reports spasms on L side of low back.  Getting gel in knee today.   ? Pertinent History History of lumpectomy with SLNB on 02/23/2014, Hx DVT, left TKR, Bil CTR, right knee pain (TKR to be scheduled); vertigo, uterine cancer   ? How long can you sit comfortably? 15 min   ? How long can you stand comfortably? 15 min   ? How long can you walk comfortably? 15 min   ? Patient Stated Goals more mobility with less pain   ? Currently in Pain? Yes   ? Pain Score 5    ? Pain Location Back   ? Pain Orientation Left;Lower   ? Pain Descriptors / Indicators Spasm   ? Pain Onset More than a month ago   ? ?  ?  ? ?  ? ? ? ? ? ? ? ? ? ? ? ? ?Flowsheet Row Outpatient Rehab from 07/28/2019 in [facility]  ?Lymphedema Life Impact Scale Total Score 57.35 %  ? ?  ? ? ? ? ? ? ? ? [facility] Adult PT Treatment/Exercise - 09/01/21 0001   ? ?  ? Exercises  ? Exercises Lumbar   ?  ? Lumbar Exercises: Stretches  ? Single Knee to Chest Stretch Left;2 reps;10 seconds   ? Single Knee to Chest Stretch Limitations with towel   ? Lower Trunk Rotation 5 reps;10 seconds   ? Pelvic Tilt 10 reps   ? Pelvic Tilt Limitations cues to prevent breathholding   ?  ? Lumbar Exercises: Supine  ? Bridge 20 reps   ? Bridge Limitations cues for bracing   ? Straight Leg  Raise 10 reps   ? Straight Leg Raises Limitations both sides, cues for abdominal bracing   ?  ? Lumbar Exercises: Sidelying  ? Clam Both;20 reps   ?  ? Manual Therapy  ? Manual Therapy  Joint mobilization;Soft tissue mobilization;Other (comment)   ? Manual therapy comments to low back to decrease muscle spasm and pain   ? Joint Mobilization UPA mobs L to lumbar spine grade 2-3   ? Soft tissue mobilization STM to L lumbar paraspinals and glut med and piriformis   ? Other Manual Therapy skilled palpation and monitoring with dry needling.   ? ?  ?  ? ?  ? ? ? Trigger Point Dry Needling - 09/01/21 0001   ? ? Consent Given? Yes   ? Education Handout Provided Yes   ? Muscles Treated Back/Hip Gluteus medius;Piriformis;Lumbar multifidi   ? Dry Needling Comments left side, L1-5   ? Gluteus Medius Response Palpable increased muscle length   ? Piriformis Response Twitch response elicited;Palpable increased muscle length   ? Lumbar multifidi Response Twitch response elicited   ? ?  ?  ? ?  ? ? ? ? ? ? ? ? PT Education - 09/01/21 0908   ? ? Education Details HEP update Access Code: [email or web address].  Education on risks and benefits of dry needling and aftercare.   ? Person(s) Educated Patient   ? Methods Explanation;Demonstration;Verbal cues;Handout   ? Comprehension Verbalized understanding;Returned demonstration   ? ?  ?  ? ?  ? ? ? PT Short Term Goals - 09/01/21 0844   ? ?  ? PT SHORT TERM GOAL #1  ? Title Pt will be independent with initial HEP   ? Baseline -   ? Time 2   ? Period Weeks   ? Status On-going   ? Target Date 09/13/21   ?  ? PT SHORT TERM GOAL #2  ? Title Pt to demonstrate normal spinal alignment with no lateral shift   ? Time 3   ? Period Weeks   ? Status On-going   ? Target Date 09/20/21   ? ?  ?  ? ?  ? ? ? ? PT Long Term Goals - 09/01/21 0844   ? ?  ? PT LONG TERM GOAL #1  ? Title Ind with HEP and its progression   ? Baseline -   ? Time 6   ? Period Weeks   ? Status On-going   ? Target Date 10/11/21   ?  ? PT LONG TERM GOAL #2  ? Title Patient able to sit, stand and walk without limitation from LBP   ? Baseline -   ? Time 6   ? Period Weeks   ? Status On-going   ? Target Date 10/11/21   ?  ? PT  LONG TERM GOAL #3  ? Title Pt able to sleep without waking from low back pain   ? Baseline -   ? Time 6   ? Period Weeks   ? Status On-going   ? Target Date 10/11/21   ?  ? PT LONG TERM GOAL #4  ? Title Pa

## 2021-09-01 NOTE — Patient Instructions (Addendum)
Access Code: [email or web address] ?URL: [email or web address] ?Date: 09/01/2021 ?Prepared by: Glenetta Hew ? ?Exercises ?- Lateral Shift Correction at Wall  - 3 x daily - 7 x weekly - 2 sets - 10 reps - 5-10 sec  hold ?- Seated Hamstring Stretch with Chair  - 2 x daily - 7 x weekly - 1 sets - 3 reps - 30-60 sec hold ?- Seated Hip Flexor Stretch  - 2 x daily - 7 x weekly - 1 sets - 3 reps - 30-60 sec hold ?- Supine Lower Trunk Rotation  - 1 x daily - 7 x weekly - 1 sets - 10 reps - 10-15 sec hold ?- Supine Posterior Pelvic Tilt  - 1 x daily - 7 x weekly - 2 sets - 10 reps ?- Supine Bridge  - 1 x daily - 7 x weekly - 2 sets - 10 reps ?- Clamshell  - 1 x daily - 7 x weekly - 2 sets - 10 reps ?- Hooklying Single Knee to Chest Stretch with Towel  - 1 x daily - 7 x weekly - 1 sets - 3 reps - 15 sec  hold ? ?Trigger Point Dry Needling ? ?What is Trigger Point Dry Needling (DN)? ?DN is a physical therapy technique used to treat muscle pain and dysfunction. Specifically, DN helps deactivate muscle trigger points (muscle knots).  ?A thin filiform needle is used to penetrate the skin and stimulate the underlying trigger point. The goal is for a local twitch response (LTR) to occur and for the trigger point to relax. No medication of any kind is injected during the procedure.  ? ?What Does Trigger Point Dry Needling Feel Like?  ?The procedure feels different for each individual patient. Some patients report that they do not actually feel the needle enter the skin and overall the process is not painful. Very mild bleeding may occur. However, many patients feel a deep cramping in the muscle in which the needle was inserted. This is the local twitch response.  ? ?How Will I feel after the treatment? ?Soreness is normal, and the onset of soreness may not occur for a few hours. Typically this soreness does not last longer than two days.  ?Bruising is uncommon, however; ice can be used to decrease any possible bruising.  ?In  rare cases feeling tired or nauseous after the treatment is normal. In addition, your symptoms may get worse before they get better, this period will typically not last longer than 24 hours.  ? ?What Can I do After My Treatment? ?Increase your hydration by drinking more water for the next 24 hours. ?You may place ice or heat on the areas treated that have become sore, however, do not use heat on inflamed or bruised areas. Heat often brings more relief post needling. ?You can continue your regular activities, but vigorous activity is not recommended initially after the treatment for 24 hours. ?DN is best combined with other physical therapy such as strengthening, stretching, and other therapies.  ? ?

## 2021-09-06 ENCOUNTER — Encounter: Payer: Medicare Other | Admitting: Physical Therapy

## 2021-09-11 ENCOUNTER — Encounter: Payer: Self-pay | Admitting: Family

## 2021-09-12 ENCOUNTER — Encounter (INDEPENDENT_AMBULATORY_CARE_PROVIDER_SITE_OTHER): Payer: Self-pay | Admitting: Bariatrics

## 2021-09-12 ENCOUNTER — Ambulatory Visit (INDEPENDENT_AMBULATORY_CARE_PROVIDER_SITE_OTHER): Payer: Medicare Other | Admitting: Bariatrics

## 2021-09-12 ENCOUNTER — Other Ambulatory Visit: Payer: Self-pay

## 2021-09-12 VITALS — BP 127/81 | HR 80 | Temp 97.8°F | Ht 66.0 in | Wt 266.0 lb

## 2021-09-12 DIAGNOSIS — Z6841 Body Mass Index (BMI) 40.0 and over, adult: Secondary | ICD-10-CM

## 2021-09-12 DIAGNOSIS — I10 Essential (primary) hypertension: Secondary | ICD-10-CM

## 2021-09-12 DIAGNOSIS — R7303 Prediabetes: Secondary | ICD-10-CM

## 2021-09-12 DIAGNOSIS — E669 Obesity, unspecified: Secondary | ICD-10-CM | POA: Diagnosis not present

## 2021-09-13 ENCOUNTER — Ambulatory Visit: Payer: Medicare Other | Admitting: Physical Therapy

## 2021-09-13 ENCOUNTER — Encounter: Payer: Self-pay | Admitting: Orthopaedic Surgery

## 2021-09-13 ENCOUNTER — Ambulatory Visit (INDEPENDENT_AMBULATORY_CARE_PROVIDER_SITE_OTHER): Payer: Medicare Other | Admitting: Orthopaedic Surgery

## 2021-09-13 DIAGNOSIS — M1711 Unilateral primary osteoarthritis, right knee: Secondary | ICD-10-CM

## 2021-09-13 DIAGNOSIS — M25561 Pain in right knee: Secondary | ICD-10-CM

## 2021-09-13 MED ORDER — HYALURONAN 30 MG/2ML IX SOSY
30.0000 mg | PREFILLED_SYRINGE | INTRA_ARTICULAR | Status: AC | PRN
  Administered 2021-09-13: 30 mg via INTRA_ARTICULAR

## 2021-09-13 NOTE — Progress Notes (Signed)
? ?Office Visit Note ?  ?Patient: Michelle Kennedy           ?Date of Birth: 1955/10/18           ?MRN: [id number] ?Visit Date: 09/13/2021 ?             ?Requested by: Debbrah Alar, NP ?[facility] ?[address] ?[place],  [place] [address] ?PCP: Debbrah Alar, NP ? ? ?Assessment & Plan: ?Visit Diagnoses: Right Knee Pain ? ?Plan: Patient is here for her second Orthovisc injection into her right knee.  She has multiple allergies and is allergic to Betadine.  She does think she had some improvement with 1 injection.  She did take a trip to [place] and did quite a bit of walking ? ?Follow-Up Instructions: No follow-ups on file.  ? ?Orders:  ?No orders of the defined types were placed in this encounter. ? ?No orders of the defined types were placed in this encounter. ? ? ? ? Procedures: ?Large Joint Inj: R knee on 09/13/2021 3:37 PM ?Indications: pain and diagnostic evaluation ?Details: 25 G 1.5 in needle, anteromedial approach ? ?Arthrogram: No ? ?Medications: 30 mg Hyaluronan 30 MG/2ML ?Outcome: tolerated well, no immediate complications ?Procedure, treatment alternatives, risks and benefits explained, specific risks discussed. Consent was given by the patient.  ? ? ? ? ?Clinical Data: ?No additional findings. ? ? ?Subjective: ?Chief Complaint  ?Patient presents with  ? Right Knee - Follow-up  ?  Orthovisc #2  ?Patient presents today for the second Orthovisc injection in her right knee.  ? ? ? ?Review of Systems  ?All other systems reviewed and are negative. ? ? ?Objective: ?Vital Signs: There were no vitals taken for this visit. ? ?Physical Exam ?Constitutional:   ?   Appearance: Normal appearance.  ?Neurological:  ?   Mental Status: She is alert.  ? ? ?Ortho Exam ?Exam of right knee no redness or effusion. ?Specialty Comments:  ?Lumbar x-ray 12/17/2019 ? ?X-Rays show mild-moderate L2-3 DDD.  There is moderate L4-5 and L5-S1  ?facet DJD.  SI joints have mild degenerative change.  Hip joints are   ?unremarkable.  No sign of compression fracture or neoplasm. ? ?Imaging: ?No results found. ? ? ?PMFS History: ?Patient Active Problem List  ? Diagnosis Date Noted  ? Acute left-sided low back pain with left-sided sciatica 04/27/2021  ? Nondisplaced fracture of middle phalanx of left lesser toe(s), initial encounter for closed fracture 09/17/2020  ? Chronic diastolic CHF (congestive heart failure) ([place]) 08/23/2019  ? Retinal artery occlusion 08/22/2019  ? History of total left knee replacement 12/07/2017  ? OA (osteoarthritis) of knee 12/03/2017  ? Obesity 01/01/2017  ? Essential hypertension 10/18/2016  ? Constipation 10/04/2016  ? Vitamin D deficiency 10/04/2016  ? Prediabetes 10/04/2016  ? Obesity, Class III, BMI 40-49.9 (morbid obesity) ([place]) 09/20/2016  ? OSA (obstructive sleep apnea) 02/16/2016  ? Malignant neoplasm of thyroid gland ([place]) 10/11/2015  ? De Quervain's tenosynovitis, bilateral 09/15/2015  ? Trigger finger, acquired 06/22/2015  ? Chemotherapy-induced peripheral neuropathy ([place]) 05/10/2015  ? Postmenopausal osteoporosis 12/08/2014  ? Preventative health care 12/01/2014  ? DVT (deep venous thrombosis) ([place]) 08/28/2014  ? Lymphedema of arm 08/28/2014  ? Mucositis due to chemotherapy 06/02/2014  ? Menopausal syndrome (hot flashes) 12/31/2013  ? Breast cancer of upper-outer quadrant of left female breast ([place]) 12/26/2013  ? 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 Gingivitis 12/05/2013  ? Seasonal allergies 09/24/2013  ? Hyperlipidemia 06/02/2011  ?  Migraine 06/02/2011  ? Tinnitus of both ears 02/14/2011  ? Fibromyalgia 05/10/2010  ? Asthma 03/22/2010  ? 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 GERD 12/20/2009  ? Depression 12/01/2009  ? ?Past Medical History:  ?Diagnosis Date  ? Abnormally small mouth   ? Achilles tendon disorder, right   ? Anemia   ? Anxiety   ? Arthritis   ? hips and knees  ? Asthma   ? daily inhaler, prn inhaler and neb.  ? Asthma due to environmental  allergies   ? Breast cancer ([place]) 01/2014  ? left  ? Chest pain   ? CHF (congestive heart failure) ([place])   ? COPD (chronic obstructive pulmonary disease) ([place])   ? Dental bridge present   ? upper front and lower right  ? Dental crowns present   ? x 3  ? Depression   ? Esophageal spasm   ? reports since her chemo and breast surgery  she developed esophageal spasms and reports this is in the past has casue her 02 to desat in the  70s , denies sycnope in relation to this , does report hx of vertigo as well   ? Family history of anesthesia complication   ? twin brother aspirated and died on OR table, per pt.  ? Fibromyalgia   ? Gallbladder disease   ? Gallstones   ? GERD (gastroesophageal reflux disease)   ? Glaucoma   ? H/O blood clots   ? had blood to  PICC line   ? History of gastric ulcer   ? as a teenager  ? History of seizure age 22  ? as a reaction to Penicillin - no seizures since  ? History of stomach ulcers   ? History of thyroid cancer   ? s/p thyroidectomy  ? HTN (hypertension)   ? Hyperlipidemia   ? Hypothyroidism   ? Joint pain   ? Left knee injury   ? Liver disorder   ? seen when she had her hysterectomy , reports she was told it was  " small lesion" ; but asymptomatic   ? Lymphedema   ? left arm  ? Migraines   ? Multiple food allergies   ? Obesity   ? OSA (obstructive sleep apnea) 02/16/2016  ? CPAP  ? Palpitations   ? reports no longer experiences  ? Personal history of chemotherapy 2015  ? Personal history of radiation therapy 2015  ? Left  ? Prediabetes   ? Rheumatoid arthritis ([place])   ? SOB (shortness of breath)   ? Swelling of both ankles   ? Tinnitus   ? UTI (lower urinary tract infection) 02/17/2014  ? Vertigo   ? last episode  was 2 weeks ago   ? Vitamin D deficiency   ? Wears contact lenses   ? left eye only   ? Wears hearing aid in both ears   ?  ?Family History  ?Problem Relation Age of Onset  ? Alcohol abuse Mother   ? Arthritis Mother   ? Hypertension Mother   ? Bipolar disorder Mother   ?  Breast cancer Mother 19  ?     unconfirmed  ? Lung cancer Mother 33  ?     smoker  ? Hyperlipidemia Mother   ? Stroke Mother   ? Cancer Mother   ? Depression Mother   ? Anxiety disorder Mother   ? Alcoholism Mother   ? Drug abuse Mother   ? Eating disorder Mother   ?  Obesity Mother   ? Alcohol abuse Father   ? Hyperlipidemia Father   ? Kidney disease Father   ? Diabetes Father   ? Hypertension Father   ? Cystic kidney disease Father   ? Thyroid disease Father   ? Liver disease Father   ? Alcoholism Father   ? Lung cancer Father   ? Thyroid cancer Sister 26  ?     type?; currently 20  ? Other Sister   ?     ovarian tumor @ 29; TAH/BSO  ? Breast cancer Sister   ? Thyroid cancer Brother   ?     dx 36s; currently 33  ? Breast cancer Maternal Aunt   ?     dx 68s; deceased 46  ? Thyroid cancer Paternal Aunt   ?     All 3 paternal aunts with thyroid ca in 30s/40s  ? Lung cancer Paternal Aunt   ?     2 of 3 paternal aunts with lung cancer  ? Arthritis Maternal Grandmother   ? Diabetes Paternal Grandmother   ? Heart disease Other   ? COPD Other   ? Asthma Other   ?  ?Past Surgical History:  ?Procedure Laterality Date  ? ABDOMINAL HYSTERECTOMY  2004  ? complete  ? ACHILLES TENDON REPAIR Right   ? APPENDECTOMY  2004  ? BREAST EXCISIONAL BIOPSY    ? BREAST LUMPECTOMY Left   ? 2015  ? BREAST LUMPECTOMY WITH NEEDLE LOCALIZATION AND AXILLARY SENTINEL LYMPH NODE BX Left 02/23/2014  ? Procedure: BREAST LUMPECTOMY WITH NEEDLE LOCALIZATION AND AXILLARY SENTINEL LYMPH NODE BIOPSY;  Surgeon: Excell Seltzer, MD;  Location: [facility];  Service: General;  Laterality: Left;  ? BREAST SURGERY  2011  ? left breast biposy  ? CHOLECYSTECTOMY  1990  ? COLONOSCOPY W/ POLYPECTOMY  06/2009  ? EYE SURGERY Right 2013  ? exc. warts from underneath eyelid  ? INCONTINENCE SURGERY  2004  ? KNEE ARTHROSCOPY Bilateral   ? x 6 each knee  ? LIGAMENT REPAIR Right   ? thumb/wrist  ? ORIF TOE FRACTURE Right   ? great toe  ? PORTACATH PLACEMENT  N/A 03/24/2014  ? Procedure: INSERTION PORT-A-CATH;  Surgeon: Excell Seltzer, MD;  Location: [facility];  Service: General;  Laterality: N/A;; removed   ? THYROIDECTOMY  2000  ? TONSILLECTOMY AND ADENOIDEC

## 2021-09-15 ENCOUNTER — Ambulatory Visit: Payer: Medicare Other | Admitting: Physical Therapy

## 2021-09-15 ENCOUNTER — Encounter: Payer: Self-pay | Admitting: Physical Therapy

## 2021-09-15 DIAGNOSIS — M5459 Other low back pain: Secondary | ICD-10-CM | POA: Diagnosis not present

## 2021-09-15 DIAGNOSIS — R252 Cramp and spasm: Secondary | ICD-10-CM

## 2021-09-15 DIAGNOSIS — R293 Abnormal posture: Secondary | ICD-10-CM

## 2021-09-15 NOTE — Therapy (Signed)
[facility] ?[address] ?[place], [place], [address] ?Phone: [phone]   Fax:  [phone] ? ?Physical Therapy Treatment ? ?Patient Details  ?Name: Michelle Kennedy ?MRN: [id number] ?Date of Birth: [birth year] ?Referring Provider (PT): Barnet Pall NP/ Joni Fears ? ? ?Encounter Date: 09/15/2021 ? ? PT End of Session - 09/15/21 0848   ? ? Visit Number 3   ? Date for PT Re-Evaluation 10/11/21   ? Authorization Type MCR   ? Progress Note Due on Visit 10   ? PT Start Time 0845   ? PT Stop Time 6789   ? PT Time Calculation (min) 42 min   ? Activity Tolerance Patient tolerated treatment well   ? Behavior During Therapy [facility] for tasks assessed/performed   ? ?  ?  ? ?  ? ? ?Past Medical History:  ?Diagnosis Date  ? Abnormally small mouth   ? Achilles tendon disorder, right   ? Anemia   ? Anxiety   ? Arthritis   ? hips and knees  ? Asthma   ? daily inhaler, prn inhaler and neb.  ? Asthma due to environmental allergies   ? Breast cancer ([facility] [place]) 01/2014  ? left  ? Chest pain   ? CHF (congestive heart failure) ([place])   ? COPD (chronic obstructive pulmonary disease) ([place])   ? Dental bridge present   ? upper front and lower right  ? Dental crowns present   ? x 3  ? Depression   ? Esophageal spasm   ? reports since her chemo and breast surgery  she developed esophageal spasms and reports this is in the past has casue her 02 to desat in the  70s , denies sycnope in relation to this , does report hx of vertigo as well   ? Family history of anesthesia complication   ? twin brother aspirated and died on OR table, per pt.  ? Fibromyalgia   ? Gallbladder disease   ? Gallstones   ? GERD (gastroesophageal reflux disease)   ? Glaucoma   ? H/O blood clots   ? had blood to  PICC line   ? History of gastric ulcer   ? as a teenager  ? History of seizure age 34  ? as a reaction to Penicillin - no seizures since  ? History of stomach ulcers   ? History of thyroid cancer   ?  s/p thyroidectomy  ? HTN (hypertension)   ? Hyperlipidemia   ? Hypothyroidism   ? Joint pain   ? Left knee injury   ? Liver disorder   ? seen when she had her hysterectomy , reports she was told it was  " small lesion" ; but asymptomatic   ? Lymphedema   ? left arm  ? Migraines   ? Multiple food allergies   ? Obesity   ? OSA (obstructive sleep apnea) 02/16/2016  ? CPAP  ? Palpitations   ? reports no longer experiences  ? Personal history of chemotherapy 2015  ? Personal history of radiation therapy 2015  ? Left  ? Prediabetes   ? Rheumatoid arthritis ([place])   ? SOB (shortness of breath)   ? Swelling of both ankles   ? Tinnitus   ? UTI (lower urinary tract infection) 02/17/2014  ? Vertigo   ? last episode  was 2 weeks ago   ? Vitamin D deficiency   ? Wears contact lenses   ? left  eye only   ? Wears hearing aid in both ears   ? ? ?Past Surgical History:  ?Procedure Laterality Date  ? ABDOMINAL HYSTERECTOMY  2004  ? complete  ? ACHILLES TENDON REPAIR Right   ? APPENDECTOMY  2004  ? BREAST EXCISIONAL BIOPSY    ? BREAST LUMPECTOMY Left   ? 2015  ? BREAST LUMPECTOMY WITH NEEDLE LOCALIZATION AND AXILLARY SENTINEL LYMPH NODE BX Left 02/23/2014  ? Procedure: BREAST LUMPECTOMY WITH NEEDLE LOCALIZATION AND AXILLARY SENTINEL LYMPH NODE BIOPSY;  Surgeon: Excell Seltzer, MD;  Location: [facility];  Service: General;  Laterality: Left;  ? BREAST SURGERY  2011  ? left breast biposy  ? CHOLECYSTECTOMY  1990  ? COLONOSCOPY W/ POLYPECTOMY  06/2009  ? EYE SURGERY Right 2013  ? exc. warts from underneath eyelid  ? INCONTINENCE SURGERY  2004  ? KNEE ARTHROSCOPY Bilateral   ? x 6 each knee  ? LIGAMENT REPAIR Right   ? thumb/wrist  ? ORIF TOE FRACTURE Right   ? great toe  ? PORTACATH PLACEMENT N/A 03/24/2014  ? Procedure: INSERTION PORT-A-CATH;  Surgeon: Excell Seltzer, MD;  Location: [facility];  Service: General;  Laterality: N/A;; removed   ? THYROIDECTOMY  2000  ? TONSILLECTOMY AND ADENOIDECTOMY  2000  ?  TOTAL KNEE ARTHROPLASTY Left 12/03/2017  ? Procedure: LEFT TOTAL KNEE ARTHROPLASTY;  Surgeon: Gaynelle Arabian, MD;  Location: [facility];  Service: Orthopedics;  Laterality: Left;  ? TUMOR EXCISION    ? from thoracic spine  ? ? ?There were no vitals filed for this visit. ? ? Subjective Assessment - 09/15/21 1051   ? ? Subjective Pt. returned from [place] on saturday.  She had the injections in her knees before trip and had no difficulty standing in lines or walking.  She does feel like she bruised her tailbone on the trip and has had trouble sitting since then,  ordered donut from [facility] due to pain.   ? Pertinent History History of lumpectomy with SLNB on 02/23/2014, Hx DVT, left TKR, Bil CTR, right knee pain (TKR to be scheduled); vertigo, uterine cancer   ? How long can you sit comfortably? 15 min   ? How long can you stand comfortably? 15 min   ? How long can you walk comfortably? 15 min   ? Patient Stated Goals more mobility with less pain   ? Pain Score 6    ? Pain Location Sacrum   ? Pain Onset More than a month ago   ? ?  ?  ? ?  ? ? ? ? ? ? ? ? ? ? ? ? ?Flowsheet Row Outpatient Rehab from 07/28/2019 in [facility]  ?Lymphedema Life Impact Scale Total Score 57.35 %  ? ?  ? ? ? ? ? ? ? ? [facility] Adult PT Treatment/Exercise - 09/15/21 0001   ? ?  ? Lumbar Exercises: Stretches  ? Lower Trunk Rotation Limitations   ? Lower Trunk Rotation Limitations x10 bil   ? Standing Side Bend 5 reps;Right   ? Standing Extension 10 reps   ?  ? Lumbar Exercises: Aerobic  ? Elliptical x3 min   increased dyspnea  ?  ? Lumbar Exercises: Supine  ? Bridge 20 reps   ? Bridge Limitations cues for bracing   ?  ? Lumbar Exercises: Prone  ? Straight Leg Raise 10 reps   ? Straight Leg Raises Limitations 2 x 10 bil   ?  ?  Manual Therapy  ? Manual Therapy Joint mobilization;Soft tissue mobilization;Other (comment)   ? Manual therapy comments to decrease pain and spasm   ? Joint Mobilization PA mobs to lumbar spine  grade 3-4 and sacrum grade 1-2   ? Soft tissue mobilization IASTM with foam roller to bil glutes/proximal hamstrings and QLs bil   ? Other Manual Therapy skilled palpation and monitoring with dry needling.   ? ?  ?  ? ?  ? ? ? Trigger Point Dry Needling - 09/15/21 0001   ? ? Consent Given? Yes   ? Education Handout Provided Previously provided   ? Muscles Treated Back/Hip Gluteus maximus;Piriformis   ? Dry Needling Comments left   ? Gluteus Maximus Response Twitch response elicited;Palpable increased muscle length   ? Piriformis Response Twitch response elicited;Palpable increased muscle length   ? ?  ?  ? ?  ? ? ? ? ? ? ? ? ? ? PT Short Term Goals - 09/01/21 0844   ? ?  ? PT SHORT TERM GOAL #1  ? Title Pt will be independent with initial HEP   ? Baseline -   ? Time 2   ? Period Weeks   ? Status On-going   ? Target Date 09/13/21   ?  ? PT SHORT TERM GOAL #2  ? Title Pt to demonstrate normal spinal alignment with no lateral shift   ? Time 3   ? Period Weeks   ? Status On-going   ? Target Date 09/20/21   ? ?  ?  ? ?  ? ? ? ? PT Long Term Goals - 09/01/21 0844   ? ?  ? PT LONG TERM GOAL #1  ? Title Ind with HEP and its progression   ? Baseline -   ? Time 6   ? Period Weeks   ? Status On-going   ? Target Date 10/11/21   ?  ? PT LONG TERM GOAL #2  ? Title Patient able to sit, stand and walk without limitation from LBP   ? Baseline -   ? Time 6   ? Period Weeks   ? Status On-going   ? Target Date 10/11/21   ?  ? PT LONG TERM GOAL #3  ? Title Pt able to sleep without waking from low back pain   ? Baseline -   ? Time 6   ? Period Weeks   ? Status On-going   ? Target Date 10/11/21   ?  ? PT LONG TERM GOAL #4  ? Title Patient able to demonstrate improved hip flexion strength by being able to march in a seated position to perform current exercise program.   ? Baseline -   ? Time 6   ? Period Weeks   ? Status On-going   ? Target Date 10/11/21   ?  ? PT LONG TERM GOAL #5  ? Title Improved FOTO to 64 showing functional  improvement   ? Baseline 53   ? Time 6   ? Period Weeks   ? Status On-going   ? Target Date 10/11/21   ? ?  ?  ? ?  ? ? ? ? ? ? ? ? Plan - 09/15/21 1049   ? ? Clinical Impression Statement Pt. returns from trip to

## 2021-09-16 ENCOUNTER — Encounter: Payer: Self-pay | Admitting: Internal Medicine

## 2021-09-18 ENCOUNTER — Other Ambulatory Visit: Payer: Self-pay | Admitting: Internal Medicine

## 2021-09-18 NOTE — Progress Notes (Signed)
? ? ? ?Chief Complaint:  ? ?OBESITY ?Michelle Kennedy is here to discuss her progress with her obesity treatment plan along with follow-up of her obesity related diagnoses. Adaline is on the Category 4 Plan and states she is following her eating plan approximately 40% of the time. Yuvonne states she is walking 8 miles 5 times per week. ? ?Today's visit was #: 4 ?Starting weight: 275 lbs ?Starting date: 07/28/2021 ?Today's weight: 266 lbs ?Today's date: 09/12/2021 ?Total lbs lost to date: 9 lbs ?Total lbs lost since last in-office visit: 2 lbs ? ?Interim History: Michelle Kennedy is down another 2 lbs from her last visit.  Her goal at this time is 250 lbs and will adjust. ? ?Subjective:  ? ?1. Essential hypertension ?Michelle Kennedy is currently taking Norvasc and Lasix. Her hypertension is controlled. Review: taking medications as instructed, no medication side effects noted, no chest pain on exertion, no dyspnea on exertion, no swelling of ankles.  ? ?BP Readings from Last 3 Encounters:  ?09/12/21 127/81  ?08/29/21 121/77  ?08/11/21 121/84  ? ?2. Prediabetes ?Michelle Kennedy has a diagnosis of prediabetes based on her elevated HgA1c and was informed this puts her at greater risk of developing diabetes. She continues to work on diet and exercise to decrease her risk of diabetes. She denies nausea or hypoglycemia. Michelle Kennedy is currently taking Metformin. ? ?Lab Results  ?Component Value Date  ? HGBA1C 5.8 (H) 07/28/2021  ? ?Lab Results  ?Component Value Date  ? INSULIN 25.6 (H) 07/28/2021  ? INSULIN 21.8 09/20/2016  ? ?Assessment/Plan:  ? ?1. Essential hypertension ?Michelle Kennedy is working on healthy weight loss and exercise to improve blood pressure control. We will watch for signs of hypotension as she continues her lifestyle modifications. Michelle Kennedy will continue taking her medication. ? ?2. Prediabetes ?Michelle Kennedy will continue to work on weight loss, exercise, and decreasing simple carbohydrates to help decrease the risk of diabetes. Michelle Kennedy will continue  taking her medication.  She will minimize her carbohydrates, starches and sugars. ? ?3. Obesity, current BMI 43.1 ?Michelle Kennedy is currently in the action stage of change. As such, her goal is to continue with weight loss efforts. She has agreed to the Category 4 Plan.  ? ?Michelle Kennedy will continue with meal planning and will adhere to the plan 80-90%. She will increase her water intake.  ?Exercise goals: Michelle Kennedy will walk in the pool, aerobics twice weekly, and yoga on Friday's ? ?Behavioral modification strategies: increasing lean protein intake, decreasing simple carbohydrates, increasing vegetables, increasing water intake, decreasing eating out, no skipping meals, meal planning and cooking strategies, keeping healthy foods in the home, and planning for success. ? ?Michelle Kennedy has agreed to follow-up with our clinic in 3 weeks. She was informed of the importance of frequent follow-up visits to maximize her success with intensive lifestyle modifications for her multiple health conditions.  ? ?Objective:  ? ?Blood pressure 127/81, pulse 80, temperature 97.8 ?F (36.6 ?C), height '5\' 6"'$  (1.676 m), SpO2 96 %. ?Body mass index is 43.26 kg/m?. ? ?General: Cooperative, alert, well developed, in no acute distress. ?HEENT: Conjunctivae and lids unremarkable. ?Cardiovascular: Regular rhythm.  ?Lungs: Normal work of breathing. ?Neurologic: No focal deficits.  ? ?Lab Results  ?Component Value Date  ? CREATININE 0.78 04/27/2021  ? BUN 14 04/27/2021  ? NA 141 04/27/2021  ? K 4.2 04/27/2021  ? CL 104 04/27/2021  ? CO2 30 04/27/2021  ? ?Lab Results  ?Component Value Date  ? ALT 27 07/20/2021  ? AST 17 07/20/2021  ? ALKPHOS  76 07/20/2021  ? BILITOT 0.4 07/20/2021  ? ?Lab Results  ?Component Value Date  ? HGBA1C 5.8 (H) 07/28/2021  ? HGBA1C 6.0 (A) 04/22/2021  ? HGBA1C 6.2 10/26/2020  ? HGBA1C 5.8 (H) 03/01/2020  ? HGBA1C 6.1 (H) 08/23/2019  ? ?Lab Results  ?Component Value Date  ? INSULIN 25.6 (H) 07/28/2021  ? INSULIN 21.8 09/20/2016  ? ?Lab  Results  ?Component Value Date  ? TSH 0.36 07/20/2021  ? ?Lab Results  ?Component Value Date  ? CHOL 187 07/28/2021  ? HDL 64 07/28/2021  ? LDLCALC 102 (H) 07/28/2021  ? LDLDIRECT 117.0 02/26/2019  ? TRIG 122 07/28/2021  ? CHOLHDL 3 06/28/2020  ? ?Lab Results  ?Component Value Date  ? VD25OH 53.4 07/28/2021  ? VD25OH 41.4 09/20/2016  ? ?Lab Results  ?Component Value Date  ? WBC 5.1 07/28/2021  ? HGB 14.3 07/28/2021  ? HCT 45.0 07/28/2021  ? MCV 76 (L) 07/28/2021  ? PLT 255 07/28/2021  ? ?Lab Results  ?Component Value Date  ? IRON 71 06/23/2020  ? TIBC 294 06/23/2020  ? FERRITIN 94.6 06/23/2020  ? ? ?Obesity Behavioral Intervention:  ? ?Approximately 15 minutes were spent on the discussion below. ? ?ASK: ?We discussed the diagnosis of obesity with Leda Gauze today and Giavanna agreed to give [place] permission to discuss obesity behavioral modification therapy today. ? ?ASSESS: ?Michelle Kennedy has the diagnosis of obesity and her BMI today is 43.1. Michelle Kennedy is in the action stage of change.  ? ?ADVISE: ?Michelle Kennedy was educated on the multiple health risks of obesity as well as the benefit of weight loss to improve her health. She was advised of the need for long term treatment and the importance of lifestyle modifications to improve her current health and to decrease her risk of future health problems. ? ?AGREE: ?Multiple dietary modification options and treatment options were discussed and Michelle Kennedy agreed to follow the recommendations documented in the above note. ? ?ARRANGE: ?Oluwatosin was educated on the importance of frequent visits to treat obesity as outlined per CMS and USPSTF guidelines and agreed to schedule her next follow up appointment today. ? ?Attestation Statements:  ? ?Reviewed by clinician on day of visit: allergies, medications, problem list, medical history, surgical history, family history, social history, and previous encounter notes. ? ?I, Lennette Bihari, CMA, am acting as transcriptionist for Dr. Jearld Lesch, DO. ? ?I  have reviewed the above documentation for accuracy and completeness, and I agree with the above. Jearld Lesch, DO ? ?

## 2021-09-20 ENCOUNTER — Ambulatory Visit (INDEPENDENT_AMBULATORY_CARE_PROVIDER_SITE_OTHER): Payer: Medicare Other | Admitting: Orthopaedic Surgery

## 2021-09-20 ENCOUNTER — Ambulatory Visit (HOSPITAL_BASED_OUTPATIENT_CLINIC_OR_DEPARTMENT_OTHER)
Admission: RE | Admit: 2021-09-20 | Discharge: 2021-09-20 | Disposition: A | Discharge status: Home / Self Care | Payer: Medicare Other | Source: Ambulatory Visit | Attending: Family | Admitting: Family

## 2021-09-20 ENCOUNTER — Ambulatory Visit (INDEPENDENT_AMBULATORY_CARE_PROVIDER_SITE_OTHER): Payer: Medicare Other | Admitting: Family

## 2021-09-20 ENCOUNTER — Ambulatory Visit: Payer: Medicare Other | Attending: Physical Medicine and Rehabilitation | Admitting: Physical Therapy

## 2021-09-20 ENCOUNTER — Encounter: Payer: Self-pay | Admitting: Physical Therapy

## 2021-09-20 ENCOUNTER — Other Ambulatory Visit: Payer: Self-pay | Admitting: Internal Medicine

## 2021-09-20 ENCOUNTER — Encounter (INDEPENDENT_AMBULATORY_CARE_PROVIDER_SITE_OTHER): Payer: Self-pay | Admitting: Bariatrics

## 2021-09-20 ENCOUNTER — Encounter: Payer: Self-pay | Admitting: Orthopaedic Surgery

## 2021-09-20 VITALS — BP 136/72 | HR 70 | Temp 97.6°F | Resp 16 | Wt 267.0 lb

## 2021-09-20 DIAGNOSIS — M5459 Other low back pain: Secondary | ICD-10-CM | POA: Insufficient documentation

## 2021-09-20 DIAGNOSIS — M533 Sacrococcygeal disorders, not elsewhere classified: Secondary | ICD-10-CM

## 2021-09-20 DIAGNOSIS — R293 Abnormal posture: Secondary | ICD-10-CM | POA: Diagnosis present

## 2021-09-20 DIAGNOSIS — M1711 Unilateral primary osteoarthritis, right knee: Secondary | ICD-10-CM | POA: Diagnosis not present

## 2021-09-20 DIAGNOSIS — R252 Cramp and spasm: Secondary | ICD-10-CM | POA: Insufficient documentation

## 2021-09-20 MED ORDER — HYALURONAN 30 MG/2ML IX SOSY
30.0000 mg | PREFILLED_SYRINGE | INTRA_ARTICULAR | Status: AC | PRN
  Administered 2021-09-20: 30 mg via INTRA_ARTICULAR

## 2021-09-20 MED ORDER — LEVOTHYROXINE SODIUM 200 MCG PO TABS: 200.0000 ug | ORAL_TABLET | Freq: Every day | ORAL | 3 refills | Status: DC

## 2021-09-20 NOTE — Therapy (Signed)
[facility] ?[address] ?[place], [place], [address] ?Phone: [phone]   Fax:  [phone] ? ?Physical Therapy Treatment ? ?Patient Details  ?Name: Gage Treiber ?MRN: [id number] ?Date of Birth: 10-24-1955 ?Referring Provider (PT): Barnet Pall NP/ Joni Fears ? ? ?Encounter Date: 09/20/2021 ? ? PT End of Session - 09/20/21 1023   ? ? Visit Number 4   ? Number of Visits 13   ? Date for PT Re-Evaluation 10/11/21   ? Progress Note Due on Visit 10   ? PT Start Time 1016   ? PT Stop Time 1100   ? PT Time Calculation (min) 44 min   ? Activity Tolerance Patient tolerated treatment well   ? Behavior During Therapy [facility] for tasks assessed/performed   ? ?  ?  ? ?  ? ? ?Past Medical History:  ?Diagnosis Date  ? Abnormally small mouth   ? Achilles tendon disorder, right   ? Anemia   ? Anxiety   ? Arthritis   ? hips and knees  ? Asthma   ? daily inhaler, prn inhaler and neb.  ? Asthma due to environmental allergies   ? Breast cancer ([place]) 01/2014  ? left  ? Chest pain   ? CHF (congestive heart failure) ([place])   ? COPD (chronic obstructive pulmonary disease) ([place])   ? Dental bridge present   ? upper front and lower right  ? Dental crowns present   ? x 3  ? Depression   ? Esophageal spasm   ? reports since her chemo and breast surgery  she developed esophageal spasms and reports this is in the past has casue her 02 to desat in the  70s , denies sycnope in relation to this , does report hx of vertigo as well   ? Family history of anesthesia complication   ? twin brother aspirated and died on OR table, per pt.  ? Fibromyalgia   ? Gallbladder disease   ? Gallstones   ? GERD (gastroesophageal reflux disease)   ? Glaucoma   ? H/O blood clots   ? had blood to  PICC line   ? History of gastric ulcer   ? as a teenager  ? History of seizure age 59  ? as a reaction to Penicillin - no seizures since  ? History of stomach ulcers   ? History of thyroid cancer   ? s/p  thyroidectomy  ? HTN (hypertension)   ? Hyperlipidemia   ? Hypothyroidism   ? Joint pain   ? Left knee injury   ? Liver disorder   ? seen when she had her hysterectomy , reports she was told it was  " small lesion" ; but asymptomatic   ? Lymphedema   ? left arm  ? Migraines   ? Multiple food allergies   ? Obesity   ? OSA (obstructive sleep apnea) 02/16/2016  ? CPAP  ? Palpitations   ? reports no longer experiences  ? Personal history of chemotherapy 2015  ? Personal history of radiation therapy 2015  ? Left  ? Prediabetes   ? Rheumatoid arthritis ([place])   ? SOB (shortness of breath)   ? Swelling of both ankles   ? Tinnitus   ? UTI (lower urinary tract infection) 02/17/2014  ? Vertigo   ? last episode  was 2 weeks ago   ? Vitamin D deficiency   ? Wears contact lenses   ?  left eye only   ? Wears hearing aid in both ears   ? ? ?Past Surgical History:  ?Procedure Laterality Date  ? ABDOMINAL HYSTERECTOMY  2004  ? complete  ? ACHILLES TENDON REPAIR Right   ? APPENDECTOMY  2004  ? BREAST EXCISIONAL BIOPSY    ? BREAST LUMPECTOMY Left   ? 2015  ? BREAST LUMPECTOMY WITH NEEDLE LOCALIZATION AND AXILLARY SENTINEL LYMPH NODE BX Left 02/23/2014  ? Procedure: BREAST LUMPECTOMY WITH NEEDLE LOCALIZATION AND AXILLARY SENTINEL LYMPH NODE BIOPSY;  Surgeon: Excell Seltzer, MD;  Location: [facility];  Service: General;  Laterality: Left;  ? BREAST SURGERY  2011  ? left breast biposy  ? CHOLECYSTECTOMY  1990  ? COLONOSCOPY W/ POLYPECTOMY  06/2009  ? EYE SURGERY Right 2013  ? exc. warts from underneath eyelid  ? INCONTINENCE SURGERY  2004  ? KNEE ARTHROSCOPY Bilateral   ? x 6 each knee  ? LIGAMENT REPAIR Right   ? thumb/wrist  ? ORIF TOE FRACTURE Right   ? great toe  ? PORTACATH PLACEMENT N/A 03/24/2014  ? Procedure: INSERTION PORT-A-CATH;  Surgeon: Excell Seltzer, MD;  Location: [facility];  Service: General;  Laterality: N/A;; removed   ? THYROIDECTOMY  2000  ? TONSILLECTOMY AND ADENOIDECTOMY  2000  ?  TOTAL KNEE ARTHROPLASTY Left 12/03/2017  ? Procedure: LEFT TOTAL KNEE ARTHROPLASTY;  Surgeon: Gaynelle Arabian, MD;  Location: [facility];  Service: Orthopedics;  Laterality: Left;  ? TUMOR EXCISION    ? from thoracic spine  ? ? ?There were no vitals filed for this visit. ? ? Subjective Assessment - 09/20/21 1019   ? ? Subjective Pt still feeling pain in tailbone. 8-9/10 with sitting; Seeing Melissa today for xray and also injection to Rt knee   ? Pertinent History History of lumpectomy with SLNB on 02/23/2014, Hx DVT, left TKR, Bil CTR, right knee pain (TKR to be scheduled); vertigo, uterine cancer   ? Patient Stated Goals more mobility with less pain   ? Currently in Pain? Yes   ? Pain Score 5    ? Pain Location Back   ? Pain Orientation Left;Lower   ? ?  ?  ? ?  ? ? ? ? ? ? ? ? ? ? ? ? ?Flowsheet Row Outpatient Rehab from 07/28/2019 in [facility]  ?Lymphedema Life Impact Scale Total Score 57.35 %  ? ?  ? ? ? ? ? ? ? ? [facility] Adult PT Treatment/Exercise - 09/20/21 0001   ? ?  ? Lumbar Exercises: Stretches  ? Standing Side Bend Right;Left;2 reps;30 seconds   ? Figure 4 Stretch 2 reps;30 seconds;Seated   ? Figure 4 Stretch Limitations bil   ?  ? Lumbar Exercises: Aerobic  ? Elliptical x3 min   increased dyspnea  ?  ? Manual Therapy  ? Manual Therapy Joint mobilization;Soft tissue mobilization   ? Manual therapy comments to decrease pain and spasm   ? Joint Mobilization PA mobs to lumbar spine grade 3-4 and sacrum grade 1-2   ? Soft tissue mobilization to left gluteals and lumbar   ? Other Manual Therapy skilled palpation and monitoring with dry needling.   ? ?  ?  ? ?  ? ? ? Trigger Point Dry Needling - 09/20/21 0001   ? ? Consent Given? Yes   ? Education Handout Provided Previously provided   ? Muscles Treated Back/Hip Gluteus minimus;Gluteus medius;Gluteus maximus;Piriformis;Lumbar multifidi;Quadratus lumborum   ? Dry Needling  Comments bil QL; else left   ? Gluteus Minimus Response Palpable  increased muscle length;Twitch response elicited   ? Gluteus Medius Response Twitch response elicited;Palpable increased muscle length   ? Gluteus Maximus Response Palpable increased muscle length   ? Piriformis Response Palpable increased muscle length   ? Lumbar multifidi Response Palpable increased muscle length   ? Quadratus Lumborum Response Twitch response elicited   ? ?  ?  ? ?  ? ? ? ? ? ? ? ? ? ? PT Short Term Goals - 09/20/21 1205   ? ?  ? PT SHORT TERM GOAL #1  ? Title Pt will be independent with initial HEP   ? Status Achieved   ?  ? PT SHORT TERM GOAL #2  ? Title Pt to demonstrate normal spinal alignment with no lateral shift   ? Status On-going   ? ?  ?  ? ?  ? ? ? ? PT Long Term Goals - 09/01/21 0844   ? ?  ? PT LONG TERM GOAL #1  ? Title Ind with HEP and its progression   ? Baseline -   ? Time 6   ? Period Weeks   ? Status On-going   ? Target Date 10/11/21   ?  ? PT LONG TERM GOAL #2  ? Title Patient able to sit, stand and walk without limitation from LBP   ? Baseline -   ? Time 6   ? Period Weeks   ? Status On-going   ? Target Date 10/11/21   ?  ? PT LONG TERM GOAL #3  ? Title Pt able to sleep without waking from low back pain   ? Baseline -   ? Time 6   ? Period Weeks   ? Status On-going   ? Target Date 10/11/21   ?  ? PT LONG TERM GOAL #4  ? Title Patient able to demonstrate improved hip flexion strength by being able to march in a seated position to perform current exercise program.   ? Baseline -   ? Time 6   ? Period Weeks   ? Status On-going   ? Target Date 10/11/21   ?  ? PT LONG TERM GOAL #5  ? Title Improved FOTO to 64 showing functional improvement   ? Baseline 53   ? Time 6   ? Period Weeks   ? Status On-going   ? Target Date 10/11/21   ? ?  ?  ? ?  ? ? ? ? ? ? ? ? Plan - 09/20/21 1202   ? ? Clinical Impression Statement [name] continues to report sacral pain with sitting. She feels her exercises are helping her back. Only slight lateral shift today, but continues to have marked tightness  of left lumbar and gluteals. Good response to MT and  DN with increased mobility reported at end of session.   ? Comorbidities HTN, asthma, prediabetic/obese, right knee pain, h/o of radiation for breast C

## 2021-09-20 NOTE — Patient Instructions (Signed)
Please complete x-ray on the first floor.  

## 2021-09-20 NOTE — Progress Notes (Signed)
? ?Office Visit Note ?  ?Patient: Michelle Kennedy           ?Date of Birth: Nov 25, 1955           ?MRN: [id number] ?Visit Date: 09/20/2021 ?             ?Requested by: Debbrah Alar, NP ?[facility] [place] ?[address] ?[place],  [place] [address] ?PCP: Debbrah Alar, NP ? ? ?Assessment & Plan: ?Visit Diagnoses: Right knee arthritis ? ?Plan: Ms. Donson comes in today for her third Orthovisc injection into her right knee.  She tolerated the previous 2 quite well and thinks she got some significant relief after the last 1.  Does not feel the grinding is much in her knee.  She understands it is somewhat unpredictable how long this will help her.  She could get another series in about 6 months if needed.  Because of her allergies she cannot get steroid injections or lidocaine and she cannot have any Betadine on her skin ? ?Follow-Up Instructions: No follow-ups on file.  ? ?Orders:  ?No orders of the defined types were placed in this encounter. ? ?No orders of the defined types were placed in this encounter. ? ? ? ? Procedures: ?Large Joint Inj on 09/20/2021 1:22 PM ?Indications: pain and diagnostic evaluation ?Details: 25 G 1.5 in needle, anteromedial approach ? ?Arthrogram: No ? ?Medications: 30 mg Hyaluronan 30 MG/2ML ?Outcome: tolerated well, no immediate complications ?Procedure, treatment alternatives, risks and benefits explained, specific risks discussed. Consent was given by the patient.  ? ? ? ?Clinical Data: ?No additional findings. ? ? ?Subjective: ?No chief complaint on file. ?Patient presents today for the third Orthovisc injection in her right knee. ? ? ? ?Review of Systems ? ? ?Objective: ?Vital Signs: There were no vitals taken for this visit. ? ?Physical Exam ?Patient appears well comfortable sitting on exam bed ?Ortho Exam ?Right knee she has no effusion no redness she has more fluid range of motion that is less painful ?Specialty Comments:  ?Lumbar x-ray 12/17/2019 ? ?X-Rays show mild-moderate L2-3  DDD.  There is moderate L4-5 and L5-S1  ?facet DJD.  SI joints have mild degenerative change.  Hip joints are  ?unremarkable.  No sign of compression fracture or neoplasm. ? ?Imaging: ?No results found. ? ? ?PMFS History: ?Patient Active Problem List  ? Diagnosis Date Noted  ? Pain in right knee 09/13/2021  ? Acute left-sided low back pain with left-sided sciatica 04/27/2021  ? Nondisplaced fracture of middle phalanx of left lesser toe(s), initial encounter for closed fracture 09/17/2020  ? Chronic diastolic CHF (congestive heart failure) ([place]) 08/23/2019  ? Retinal artery occlusion 08/22/2019  ? History of total left knee replacement 12/07/2017  ? OA (osteoarthritis) of knee 12/03/2017  ? Obesity 01/01/2017  ? Essential hypertension 10/18/2016  ? Constipation 10/04/2016  ? Vitamin D deficiency 10/04/2016  ? Prediabetes 10/04/2016  ? Obesity, Class III, BMI 40-49.9 (morbid obesity) ([place]) 09/20/2016  ? OSA (obstructive sleep apnea) 02/16/2016  ? Malignant neoplasm of thyroid gland ([place]) 10/11/2015  ? De Quervain's tenosynovitis, bilateral 09/15/2015  ? Trigger finger, acquired 06/22/2015  ? Chemotherapy-induced peripheral neuropathy ([place]) 05/10/2015  ? Postmenopausal osteoporosis 12/08/2014  ? Preventative health care 12/01/2014  ? DVT (deep venous thrombosis) ([place]) 08/28/2014  ? Lymphedema of arm 08/28/2014  ? Mucositis due to chemotherapy 06/02/2014  ? Menopausal syndrome (hot flashes) 12/31/2013  ? Breast cancer of upper-outer quadrant of left female breast ([place]) 12/26/2013  ? 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 Gingivitis 12/05/2013  ? Seasonal allergies 09/24/2013  ? Hyperlipidemia 06/02/2011  ? Migraine 06/02/2011  ? Tinnitus of both ears 02/14/2011  ? Fibromyalgia 05/10/2010  ? Asthma 03/22/2010  ? 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 GERD 12/20/2009  ? Depression 12/01/2009  ? ?Past Medical History:  ?Diagnosis Date  ? Abnormally small mouth   ? Achilles tendon disorder, right    ? Anemia   ? Anxiety   ? Arthritis   ? hips and knees  ? Asthma   ? daily inhaler, prn inhaler and neb.  ? Asthma due to environmental allergies   ? Breast cancer ([place]) 01/2014  ? left  ? Chest pain   ? CHF (congestive heart failure) ([place])   ? COPD (chronic obstructive pulmonary disease) ([place])   ? Dental bridge present   ? upper front and lower right  ? Dental crowns present   ? x 3  ? Depression   ? Esophageal spasm   ? reports since her chemo and breast surgery  she developed esophageal spasms and reports this is in the past has casue her 02 to desat in the  70s , denies sycnope in relation to this , does report hx of vertigo as well   ? Family history of anesthesia complication   ? twin brother aspirated and died on OR table, per pt.  ? Fibromyalgia   ? Gallbladder disease   ? Gallstones   ? GERD (gastroesophageal reflux disease)   ? Glaucoma   ? H/O blood clots   ? had blood to  PICC line   ? History of gastric ulcer   ? as a teenager  ? History of seizure age 68  ? as a reaction to Penicillin - no seizures since  ? History of stomach ulcers   ? History of thyroid cancer   ? s/p thyroidectomy  ? HTN (hypertension)   ? Hyperlipidemia   ? Hypothyroidism   ? Joint pain   ? Left knee injury   ? Liver disorder   ? seen when she had her hysterectomy , reports she was told it was  " small lesion" ; but asymptomatic   ? Lymphedema   ? left arm  ? Migraines   ? Multiple food allergies   ? Obesity   ? OSA (obstructive sleep apnea) 02/16/2016  ? CPAP  ? Palpitations   ? reports no longer experiences  ? Personal history of chemotherapy 2015  ? Personal history of radiation therapy 2015  ? Left  ? Prediabetes   ? Rheumatoid arthritis ([place])   ? SOB (shortness of breath)   ? Swelling of both ankles   ? Tinnitus   ? UTI (lower urinary tract infection) 02/17/2014  ? Vertigo   ? last episode  was 2 weeks ago   ? Vitamin D deficiency   ? Wears contact lenses   ? left eye only   ? Wears hearing aid in both ears   ?  ?Family  History  ?Problem Relation Age of Onset  ? Alcohol abuse Mother   ? Arthritis Mother   ? Hypertension Mother   ? Bipolar disorder Mother   ? Breast cancer Mother 26  ?     unconfirmed  ? Lung cancer Mother 65  ?     smoker  ? Hyperlipidemia Mother   ? Stroke Mother   ? Cancer Mother   ? Depression Mother   ? Anxiety  disorder Mother   ? Alcoholism Mother   ? Drug abuse Mother   ? Eating disorder Mother   ? Obesity Mother   ? Alcohol abuse Father   ? Hyperlipidemia Father   ? Kidney disease Father   ? Diabetes Father   ? Hypertension Father   ? Cystic kidney disease Father   ? Thyroid disease Father   ? Liver disease Father   ? Alcoholism Father   ? Lung cancer Father   ? Thyroid cancer Sister 82  ?     type?; currently 66  ? Other Sister   ?     ovarian tumor @ 29; TAH/BSO  ? Breast cancer Sister   ? Thyroid cancer Brother   ?     dx 76s; currently 70  ? Breast cancer Maternal Aunt   ?     dx 87s; deceased 85  ? Thyroid cancer Paternal Aunt   ?     All 3 paternal aunts with thyroid ca in 30s/40s  ? Lung cancer Paternal Aunt   ?     2 of 3 paternal aunts with lung cancer  ? Arthritis Maternal Grandmother   ? Diabetes Paternal Grandmother   ? Heart disease Other   ? COPD Other   ? Asthma Other   ?  ?Past Surgical History:  ?Procedure Laterality Date  ? ABDOMINAL HYSTERECTOMY  2004  ? complete  ? ACHILLES TENDON REPAIR Right   ? APPENDECTOMY  2004  ? BREAST EXCISIONAL BIOPSY    ? BREAST LUMPECTOMY Left   ? 2015  ? BREAST LUMPECTOMY WITH NEEDLE LOCALIZATION AND AXILLARY SENTINEL LYMPH NODE BX Left 02/23/2014  ? Procedure: BREAST LUMPECTOMY WITH NEEDLE LOCALIZATION AND AXILLARY SENTINEL LYMPH NODE BIOPSY;  Surgeon: Excell Seltzer, MD;  Location: [facility];  Service: General;  Laterality: Left;  ? BREAST SURGERY  2011  ? left breast biposy  ? CHOLECYSTECTOMY  1990  ? COLONOSCOPY W/ POLYPECTOMY  06/2009  ? EYE SURGERY Right 2013  ? exc. warts from underneath eyelid  ? INCONTINENCE SURGERY  2004  ? KNEE  ARTHROSCOPY Bilateral   ? x 6 each knee  ? LIGAMENT REPAIR Right   ? thumb/wrist  ? ORIF TOE FRACTURE Right   ? great toe  ? PORTACATH PLACEMENT N/A 03/24/2014  ? Procedure: INSERTION PORT-A-CATH;  Surgeon: Namon Cirri

## 2021-09-20 NOTE — Assessment & Plan Note (Signed)
New.  Will obtain x-ray for further evaluation.  ?

## 2021-09-20 NOTE — Progress Notes (Signed)
? ?Subjective:  ? ?By signing my name below, I, [name] Perches, attest that this documentation has been prepared under the direction and in the presence of Debbrah Alar NP, 09/20/2021   ? ? Patient ID: Michelle Kennedy, female    DOB: [birth year], 66 y.o.   MRN: [id number] ? ?Chief Complaint  ?Patient presents with  ? Tailbone Pain  ?  Complains of increasing tailbone pain since 08-07-21   ? ? ?HPI ?Patient is in today for an office visit. She is with her husband.  ? ?Tailbone Pain - She complains of tailbone pain that occurred around 09/07/2021 and 09/08/2021. She rode on a ride in [place] and felt a pain that traveled up her spine. Since then, her tailbone pain is constant. Pain is okay when she is laying on her side or standing. Pain worsens when she moves, sits down or leans back.  ? ?There are no preventive care reminders to display for this patient. ? ?Past Medical History:  ?Diagnosis Date  ? Abnormally small mouth   ? Achilles tendon disorder, right   ? Anemia   ? Anxiety   ? Arthritis   ? hips and knees  ? Asthma   ? daily inhaler, prn inhaler and neb.  ? Asthma due to environmental allergies   ? Breast cancer ([place]) 01/2014  ? left  ? Chest pain   ? CHF (congestive heart failure) ([place])   ? COPD (chronic obstructive pulmonary disease) ([place])   ? Dental bridge present   ? upper front and lower right  ? Dental crowns present   ? x 3  ? Depression   ? Esophageal spasm   ? reports since her chemo and breast surgery  she developed esophageal spasms and reports this is in the past has casue her 02 to desat in the  70s , denies sycnope in relation to this , does report hx of vertigo as well   ? Family history of anesthesia complication   ? twin brother aspirated and died on OR table, per pt.  ? Fibromyalgia   ? Gallbladder disease   ? Gallstones   ? GERD (gastroesophageal reflux disease)   ? Glaucoma   ? H/O blood clots   ? had blood to  PICC line   ? History of gastric ulcer   ? as a teenager  ? History of  seizure age 50  ? as a reaction to Penicillin - no seizures since  ? History of stomach ulcers   ? History of thyroid cancer   ? s/p thyroidectomy  ? HTN (hypertension)   ? Hyperlipidemia   ? Hypothyroidism   ? Joint pain   ? Left knee injury   ? Liver disorder   ? seen when she had her hysterectomy , reports she was told it was  " small lesion" ; but asymptomatic   ? Lymphedema   ? left arm  ? Migraines   ? Multiple food allergies   ? Obesity   ? OSA (obstructive sleep apnea) 02/16/2016  ? CPAP  ? Palpitations   ? reports no longer experiences  ? Personal history of chemotherapy 2015  ? Personal history of radiation therapy 2015  ? Left  ? Prediabetes   ? Rheumatoid arthritis ([place])   ? SOB (shortness of breath)   ? Swelling of both ankles   ? Tinnitus   ? UTI (lower urinary tract infection) 02/17/2014  ? Vertigo   ? last episode  was 2 weeks  ago   ? Vitamin D deficiency   ? Wears contact lenses   ? left eye only   ? Wears hearing aid in both ears   ? ? ?Past Surgical History:  ?Procedure Laterality Date  ? ABDOMINAL HYSTERECTOMY  2004  ? complete  ? ACHILLES TENDON REPAIR Right   ? APPENDECTOMY  2004  ? BREAST EXCISIONAL BIOPSY    ? BREAST LUMPECTOMY Left   ? 2015  ? BREAST LUMPECTOMY WITH NEEDLE LOCALIZATION AND AXILLARY SENTINEL LYMPH NODE BX Left 02/23/2014  ? Procedure: BREAST LUMPECTOMY WITH NEEDLE LOCALIZATION AND AXILLARY SENTINEL LYMPH NODE BIOPSY;  Surgeon: Excell Seltzer, MD;  Location: [facility];  Service: General;  Laterality: Left;  ? BREAST SURGERY  2011  ? left breast biposy  ? CHOLECYSTECTOMY  1990  ? COLONOSCOPY W/ POLYPECTOMY  06/2009  ? EYE SURGERY Right 2013  ? exc. warts from underneath eyelid  ? INCONTINENCE SURGERY  2004  ? KNEE ARTHROSCOPY Bilateral   ? x 6 each knee  ? LIGAMENT REPAIR Right   ? thumb/wrist  ? ORIF TOE FRACTURE Right   ? great toe  ? PORTACATH PLACEMENT N/A 03/24/2014  ? Procedure: INSERTION PORT-A-CATH;  Surgeon: Excell Seltzer, MD;  Location: [facility];  Service: General;  Laterality: N/A;; removed   ? THYROIDECTOMY  2000  ? TONSILLECTOMY AND ADENOIDECTOMY  2000  ? TOTAL KNEE ARTHROPLASTY Left 12/03/2017  ? Procedure: LEFT TOTAL KNEE ARTHROPLASTY;  Surgeon: Gaynelle Arabian, MD;  Location: [facility];  Service: Orthopedics;  Laterality: Left;  ? TUMOR EXCISION    ? from thoracic spine  ? ? ?Family History  ?Problem Relation Age of Onset  ? Alcohol abuse Mother   ? Arthritis Mother   ? Hypertension Mother   ? Bipolar disorder Mother   ? Breast cancer Mother 63  ?     unconfirmed  ? Lung cancer Mother 24  ?     smoker  ? Hyperlipidemia Mother   ? Stroke Mother   ? Cancer Mother   ? Depression Mother   ? Anxiety disorder Mother   ? Alcoholism Mother   ? Drug abuse Mother   ? Eating disorder Mother   ? Obesity Mother   ? Alcohol abuse Father   ? Hyperlipidemia Father   ? Kidney disease Father   ? Diabetes Father   ? Hypertension Father   ? Cystic kidney disease Father   ? Thyroid disease Father   ? Liver disease Father   ? Alcoholism Father   ? Lung cancer Father   ? Thyroid cancer Sister 96  ?     type?; currently 56  ? Other Sister   ?     ovarian tumor @ 29; TAH/BSO  ? Breast cancer Sister   ? Thyroid cancer Brother   ?     dx 45s; currently 61  ? Breast cancer Maternal Aunt   ?     dx 57s; deceased 67  ? Thyroid cancer Paternal Aunt   ?     All 3 paternal aunts with thyroid ca in 30s/40s  ? Lung cancer Paternal Aunt   ?     2 of 3 paternal aunts with lung cancer  ? Arthritis Maternal Grandmother   ? Diabetes Paternal Grandmother   ? Heart disease Other   ? COPD Other   ? Asthma Other   ? ? ?Social History  ? ?Socioeconomic History  ? Marital status: Married  ?  Spouse name: [name]  ? Number of children: 2  ? Years of education: Not on file  ? Highest education level: Not on file  ?Occupational History  ? 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  Employer: UNEMPLOYED  ?Tobacco Use  ? Smoking status: Never  ? Smokeless tobacco: Never  ?Vaping Use  ? Vaping Use: Never  used  ?Substance and Sexual Activity  ? Alcohol use: No  ?  Alcohol/week: 0.0 standard drinks  ? Drug use: No  ? Sexual activity: Not Currently  ?  Partners: Male  ?  Comment: menarche age 71, P 2, first birth age 38, menopause age 33, [place] x 10 yrs  ?Other Topics Concern  ? Not on file  ?Social History Narrative  ? Regular exercise: yes  ? Right handed   ? Lives with husband one story home.  ? ?Social Determinants of Health  ? ?Financial Resource Strain: Not on file  ?Food Insecurity: No Food Insecurity  ? Worried About Charity fundraiser in the Last Year: Never true  ? Ran Out of Food in the Last Year: Never true  ?Transportation Needs: Not on file  ?Physical Activity: Not on file  ?Stress: Not on file  ?Social Connections: Not on file  ?Intimate Partner Violence: Not on file  ? ? ?Outpatient Medications Prior to Visit  ?Medication Sig Dispense Refill  ? albuterol (PROVENTIL) (5 MG/ML) 0.5% nebulizer solution Take 0.5 mLs (2.5 mg total) by nebulization every 6 (six) hours as needed for wheezing or shortness of breath. 40 mL 3  ? albuterol (VENTOLIN HFA) 108 (90 Base) MCG/ACT inhaler USE 2 INHALATIONS ORALLY   EVERY 6 HOURS AS NEEDED FORWHEEZING OR SHORTNESS OF   BREATH. 3 each 1  ? amLODipine (NORVASC) 5 MG tablet Take 1 tablet (5 mg total) by mouth daily. 90 tablet 1  ? anastrozole (ARIMIDEX) 1 MG tablet Take 1 tablet (1 mg total) by mouth daily. 90 tablet 3  ? CALCIUM PO Take 1 tablet by mouth every evening.    ? cholecalciferol (VITAMIN D3) 25 MCG (1000 UNIT) tablet Take 1,000 Units by mouth every evening.     ? clopidogrel (PLAVIX) 75 MG tablet Take 1 tablet (75 mg total) by mouth daily. 90 tablet 1  ? furosemide (LASIX) 20 MG tablet TAKE 1 TABLET(20 MG) BY MOUTH DAILY AS NEEDED FOR SWELLING 30 tablet 3  ? gabapentin (NEURONTIN) 100 MG capsule Take 2 capsules (200 mg total) by mouth 3 (three) times daily. 540 capsule 1  ? GEMTESA 75 MG TABS Take 1 tablet by mouth daily.    ? latanoprost (XALATAN) 0.005 %  ophthalmic solution 1 drop at bedtime.    ? levothyroxine (SYNTHROID) 200 MCG tablet Take 1 tablet (200 mcg total) by mouth daily. 90 tablet 3  ? lisinopril (ZESTRIL) 20 MG tablet Take 2 tablets (40 mg t

## 2021-09-21 ENCOUNTER — Telehealth: Payer: Self-pay | Admitting: Family

## 2021-09-21 NOTE — Telephone Encounter (Signed)
Results and information given to patient's husband. " He will pass on the message" ?

## 2021-09-21 NOTE — Telephone Encounter (Signed)
X-ray does show tailbone fracture. As we discussed at her visit this can take 6 weeks or more to heal and there is not really any treatment. She may wish to purchase a donut pillow for comfort while sitting.  ?

## 2021-09-22 ENCOUNTER — Ambulatory Visit: Payer: Medicare Other | Admitting: Physical Therapy

## 2021-09-22 ENCOUNTER — Encounter: Payer: Self-pay | Admitting: Physical Therapy

## 2021-09-22 DIAGNOSIS — R293 Abnormal posture: Secondary | ICD-10-CM

## 2021-09-22 DIAGNOSIS — R252 Cramp and spasm: Secondary | ICD-10-CM

## 2021-09-22 DIAGNOSIS — M5459 Other low back pain: Secondary | ICD-10-CM | POA: Diagnosis not present

## 2021-09-22 NOTE — Therapy (Signed)
[facility] ?[address] ?[place], [place], [address] ?Phone: [phone]   Fax:  [phone] ? ?Physical Therapy Treatment ? ?Patient Details  ?Name: Michelle Kennedy ?MRN: [id number] ?Date of Birth: July 10, 1955 ?Referring Provider (PT): Barnet Pall NP/ Joni Fears ? ? ?Encounter Date: 09/22/2021 ? ? PT End of Session - 09/22/21 1025   ? ? Visit Number 5   ? Number of Visits 13   ? Date for PT Re-Evaluation 10/11/21   ? Progress Note Due on Visit 10   ? PT Start Time 1018   ? PT Stop Time 1100   ? PT Time Calculation (min) 42 min   ? Activity Tolerance Patient tolerated treatment well   ? Behavior During Therapy [facility] for tasks assessed/performed   ? ?  ?  ? ?  ? ? ?Past Medical History:  ?Diagnosis Date  ? Abnormally small mouth   ? Achilles tendon disorder, right   ? Anemia   ? Anxiety   ? Arthritis   ? hips and knees  ? Asthma   ? daily inhaler, prn inhaler and neb.  ? Asthma due to environmental allergies   ? Breast cancer ([place] [facility]) 01/2014  ? left  ? Chest pain   ? CHF (congestive heart failure) ([place])   ? COPD (chronic obstructive pulmonary disease) ([place])   ? Dental bridge present   ? upper front and lower right  ? Dental crowns present   ? x 3  ? Depression   ? Esophageal spasm   ? reports since her chemo and breast surgery  she developed esophageal spasms and reports this is in the past has casue her 02 to desat in the  70s , denies sycnope in relation to this , does report hx of vertigo as well   ? Family history of anesthesia complication   ? twin brother aspirated and died on OR table, per pt.  ? Fibromyalgia   ? Gallbladder disease   ? Gallstones   ? GERD (gastroesophageal reflux disease)   ? Glaucoma   ? H/O blood clots   ? had blood to  PICC line   ? History of gastric ulcer   ? as a teenager  ? History of seizure age 61  ? as a reaction to Penicillin - no seizures since  ? History of stomach ulcers   ? History of thyroid cancer   ? s/p  thyroidectomy  ? HTN (hypertension)   ? Hyperlipidemia   ? Hypothyroidism   ? Joint pain   ? Left knee injury   ? Liver disorder   ? seen when she had her hysterectomy , reports she was told it was  " small lesion" ; but asymptomatic   ? Lymphedema   ? left arm  ? Migraines   ? Multiple food allergies   ? Obesity   ? OSA (obstructive sleep apnea) 02/16/2016  ? CPAP  ? Palpitations   ? reports no longer experiences  ? Personal history of chemotherapy 2015  ? Personal history of radiation therapy 2015  ? Left  ? Prediabetes   ? Rheumatoid arthritis ([place] [place])   ? SOB (shortness of breath)   ? Swelling of both ankles   ? Tinnitus   ? UTI (lower urinary tract infection) 02/17/2014  ? Vertigo   ? last episode  was 2 weeks ago   ? Vitamin D deficiency   ? Wears contact lenses   ?  left eye only   ? Wears hearing aid in both ears   ? ? ?Past Surgical History:  ?Procedure Laterality Date  ? ABDOMINAL HYSTERECTOMY  2004  ? complete  ? ACHILLES TENDON REPAIR Right   ? APPENDECTOMY  2004  ? BREAST EXCISIONAL BIOPSY    ? BREAST LUMPECTOMY Left   ? 2015  ? BREAST LUMPECTOMY WITH NEEDLE LOCALIZATION AND AXILLARY SENTINEL LYMPH NODE BX Left 02/23/2014  ? Procedure: BREAST LUMPECTOMY WITH NEEDLE LOCALIZATION AND AXILLARY SENTINEL LYMPH NODE BIOPSY;  Surgeon: Excell Seltzer, MD;  Location: [facility];  Service: General;  Laterality: Left;  ? BREAST SURGERY  2011  ? left breast biposy  ? CHOLECYSTECTOMY  1990  ? COLONOSCOPY W/ POLYPECTOMY  06/2009  ? EYE SURGERY Right 2013  ? exc. warts from underneath eyelid  ? INCONTINENCE SURGERY  2004  ? KNEE ARTHROSCOPY Bilateral   ? x 6 each knee  ? LIGAMENT REPAIR Right   ? thumb/wrist  ? ORIF TOE FRACTURE Right   ? great toe  ? PORTACATH PLACEMENT N/A 03/24/2014  ? Procedure: INSERTION PORT-A-CATH;  Surgeon: Excell Seltzer, MD;  Location: [facility];  Service: General;  Laterality: N/A;; removed   ? THYROIDECTOMY  2000  ? TONSILLECTOMY AND ADENOIDECTOMY  2000  ?  TOTAL KNEE ARTHROPLASTY Left 12/03/2017  ? Procedure: LEFT TOTAL KNEE ARTHROPLASTY;  Surgeon: Gaynelle Arabian, MD;  [facility];  Service: Orthopedics;  Laterality: Left;  ? TUMOR EXCISION    ? from thoracic spine  ? ? ?There were no vitals filed for this visit. ? ? Subjective Assessment - 09/22/21 1025   ? ? Subjective Pt still feeling pain in tailbone. 8-9/10 with sitting; Seeing Melissa today for xray and also injection to Rt knee   ? Pertinent History History of lumpectomy with SLNB on 02/23/2014, Hx DVT, left TKR, Bil CTR, right knee pain (TKR to be scheduled); vertigo, uterine cancer   ? Patient Stated Goals more mobility with less pain   ? Currently in Pain? Yes   ? Pain Score 6    ? Pain Location Back   ? Pain Orientation Left;Lower   ? ?  ?  ? ?  ? ? ? ? ? ? ? ? ? ? ? ? ?Flowsheet Row Outpatient Rehab from 07/28/2019 in [facility]  ?Lymphedema Life Impact Scale Total Score 57.35 %  ? ?  ? ? ? ? ? ? ? ? [facility] Adult PT Treatment/Exercise - 09/22/21 0001   ? ?  ? Lumbar Exercises: Prone  ? Straight Leg Raise 20 reps   ? Straight Leg Raises Limitations 2 x 10 bil   ? Opposite Arm/Leg Raise Right arm/Left leg;Left arm/Right leg;10 reps   ? Other Prone Lumbar Exercises prone press-ups with hips offset to R x 10, x 10 with overpressure   ?  ? Manual Therapy  ? Manual Therapy Joint mobilization;Soft tissue mobilization;Other (comment)   ? Manual therapy comments to decrease pain and spasm   ? Joint Mobilization PA mobs to lumbar spine grade 3-4 and sacrum grade 1-2   ? Soft tissue mobilization to left gluteals and lumbar   ? Other Manual Therapy skilled palpation and monitoring with dry needling.   ? ?  ?  ? ?  ? ? ? Trigger Point Dry Needling - 09/22/21 0001   ? ? Consent Given? Yes   ? Education Handout Provided Previously provided   ? Muscles Treated Back/Hip Gluteus  minimus;Gluteus medius;Gluteus maximus;Piriformis;Lumbar multifidi   ? Dry Needling Comments left   ? Gluteus  Minimus Response Palpable increased muscle length;Twitch response elicited   ? Gluteus Medius Response Twitch response elicited;Palpable increased muscle length   ? Gluteus Maximus Response Palpable increased muscle length   ? Piriformis Response Twitch response elicited;Palpable increased muscle length   ? Lumbar multifidi Response Palpable increased muscle length   ? ?  ?  ? ?  ? ? ? ? ? ? ? ? PT Education - 09/22/21 1239   ? ? Education Details education on anatomy and reviewed X-ray images, continue using donut to allow healing.   ? Person(s) Educated Patient   ? Methods Explanation;Demonstration   ? Comprehension Verbalized understanding   ? ?  ?  ? ?  ? ? ? PT Short Term Goals - 09/20/21 1205   ? ?  ? PT SHORT TERM GOAL #1  ? Title Pt will be independent with initial HEP   ? Status Achieved   ?  ? PT SHORT TERM GOAL #2  ? Title Pt to demonstrate normal spinal alignment with no lateral shift   ? Status On-going   ? ?  ?  ? ?  ? ? ? ? PT Long Term Goals - 09/01/21 0844   ? ?  ? PT LONG TERM GOAL #1  ? Title Ind with HEP and its progression   ? Baseline -   ? Time 6   ? Period Weeks   ? Status On-going   ? Target Date 10/11/21   ?  ? PT LONG TERM GOAL #2  ? Title Patient able to sit, stand and walk without limitation from LBP   ? Baseline -   ? Time 6   ? Period Weeks   ? Status On-going   ? Target Date 10/11/21   ?  ? PT LONG TERM GOAL #3  ? Title Pt able to sleep without waking from low back pain   ? Baseline -   ? Time 6   ? Period Weeks   ? Status On-going   ? Target Date 10/11/21   ?  ? PT LONG TERM GOAL #4  ? Title Patient able to demonstrate improved hip flexion strength by being able to march in a seated position to perform current exercise program.   ? Baseline -   ? Time 6   ? Period Weeks   ? Status On-going   ? Target Date 10/11/21   ?  ? PT LONG TERM GOAL #5  ? Title Improved FOTO to 64 showing functional improvement   ? Baseline 53   ? Time 6   ? Period Weeks   ? Status On-going   ? Target Date  10/11/21   ? ?  ?  ? ?  ? ? ? ? ? ? ? ? Plan - 09/22/21 1236   ? ? Clinical Impression Statement Michelle Kennedy reports her coxxyx is in fact broken.  Viewed X-rays with her today and reviewed anatomy model as she had som

## 2021-09-27 ENCOUNTER — Ambulatory Visit (INDEPENDENT_AMBULATORY_CARE_PROVIDER_SITE_OTHER): Payer: Medicare Other | Admitting: Nurse Practitioner

## 2021-09-27 ENCOUNTER — Ambulatory Visit: Payer: Medicare Other | Admitting: Physical Therapy

## 2021-09-27 ENCOUNTER — Encounter (INDEPENDENT_AMBULATORY_CARE_PROVIDER_SITE_OTHER): Payer: Self-pay | Admitting: Nurse Practitioner

## 2021-09-27 ENCOUNTER — Encounter: Payer: Self-pay | Admitting: Physical Therapy

## 2021-09-27 ENCOUNTER — Other Ambulatory Visit: Payer: Self-pay

## 2021-09-27 VITALS — BP 143/78 | HR 80 | Temp 96.0°F | Ht 66.0 in | Wt 263.0 lb

## 2021-09-27 DIAGNOSIS — E669 Obesity, unspecified: Secondary | ICD-10-CM

## 2021-09-27 DIAGNOSIS — I10 Essential (primary) hypertension: Secondary | ICD-10-CM | POA: Diagnosis not present

## 2021-09-27 DIAGNOSIS — M25562 Pain in left knee: Secondary | ICD-10-CM

## 2021-09-27 DIAGNOSIS — M25561 Pain in right knee: Secondary | ICD-10-CM

## 2021-09-27 DIAGNOSIS — G8929 Other chronic pain: Secondary | ICD-10-CM | POA: Diagnosis not present

## 2021-09-27 DIAGNOSIS — M5459 Other low back pain: Secondary | ICD-10-CM

## 2021-09-27 DIAGNOSIS — R252 Cramp and spasm: Secondary | ICD-10-CM

## 2021-09-27 DIAGNOSIS — Z6841 Body Mass Index (BMI) 40.0 and over, adult: Secondary | ICD-10-CM

## 2021-09-27 DIAGNOSIS — R293 Abnormal posture: Secondary | ICD-10-CM

## 2021-09-27 NOTE — Therapy (Addendum)
?OUTPATIENT PHYSICAL THERAPY TREATMENT NOTE ? ? ?Patient Name: Michelle Kennedy ?MRN: [id number] ?DOB:03/22/1956, 66 y.o., female ?Today's Date: 09/27/2021 ? ?PCP: Debbrah Alar, NP ?REFERRING PROVIDER: Barnet Pall NP/ Joni Fears ? ? PT End of Session - 09/27/21 1018   ? ? Visit Number 6   ? Number of Visits 13   ? Date for PT Re-Evaluation 10/11/21   ? Authorization Type MCR   ? Progress Note Due on Visit 10   ? PT Start Time 1018   ? PT Stop Time 1100   ? PT Time Calculation (min) 42 min   ? Activity Tolerance Patient tolerated treatment well   ? Behavior During Therapy [facility] for tasks assessed/performed   ? ?  ?  ? ?  ? ? ?Past Medical History:  ?Diagnosis Date  ? Abnormally small mouth   ? Achilles tendon disorder, right   ? Anemia   ? Anxiety   ? Arthritis   ? hips and knees  ? Asthma   ? daily inhaler, prn inhaler and neb.  ? Asthma due to environmental allergies   ? Breast cancer ([place]) 01/2014  ? left  ? Chest pain   ? CHF (congestive heart failure) ([place])   ? COPD (chronic obstructive pulmonary disease) ([place])   ? Dental bridge present   ? upper front and lower right  ? Dental crowns present   ? x 3  ? Depression   ? Esophageal spasm   ? reports since her chemo and breast surgery  she developed esophageal spasms and reports this is in the past has casue her 02 to desat in the  70s , denies sycnope in relation to this , does report hx of vertigo as well   ? Family history of anesthesia complication   ? twin brother aspirated and died on OR table, per pt.  ? Fibromyalgia   ? Gallbladder disease   ? Gallstones   ? GERD (gastroesophageal reflux disease)   ? Glaucoma   ? H/O blood clots   ? had blood to  PICC line   ? History of gastric ulcer   ? as a teenager  ? History of seizure age 25  ? as a reaction to Penicillin - no seizures since  ? History of stomach ulcers   ? History of thyroid cancer   ? s/p thyroidectomy  ? HTN (hypertension)   ? Hyperlipidemia   ? Hypothyroidism   ? Joint pain   ? Left knee  injury   ? Liver disorder   ? seen when she had her hysterectomy , reports she was told it was  " small lesion" ; but asymptomatic   ? Lymphedema   ? left arm  ? Migraines   ? Multiple food allergies   ? Obesity   ? OSA (obstructive sleep apnea) 02/16/2016  ? CPAP  ? Palpitations   ? reports no longer experiences  ? Personal history of chemotherapy 2015  ? Personal history of radiation therapy 2015  ? Left  ? Prediabetes   ? Rheumatoid arthritis ([place])   ? SOB (shortness of breath)   ? Swelling of both ankles   ? Tinnitus   ? UTI (lower urinary tract infection) 02/17/2014  ? Vertigo   ? last episode  was 2 weeks ago   ? Vitamin D deficiency   ? Wears contact lenses   ? left eye only   ? Wears hearing aid in both ears   ? ?Past Surgical  History:  ?Procedure Laterality Date  ? ABDOMINAL HYSTERECTOMY  2004  ? complete  ? ACHILLES TENDON REPAIR Right   ? APPENDECTOMY  2004  ? BREAST EXCISIONAL BIOPSY    ? BREAST LUMPECTOMY Left   ? 2015  ? BREAST LUMPECTOMY WITH NEEDLE LOCALIZATION AND AXILLARY SENTINEL LYMPH NODE BX Left 02/23/2014  ? Procedure: BREAST LUMPECTOMY WITH NEEDLE LOCALIZATION AND AXILLARY SENTINEL LYMPH NODE BIOPSY;  Surgeon: Excell Seltzer, MD;  Location: [place];  Service: General;  Laterality: Left;  ? BREAST SURGERY  2011  ? left breast biposy  ? CHOLECYSTECTOMY  1990  ? COLONOSCOPY W/ POLYPECTOMY  06/2009  ? EYE SURGERY Right 2013  ? exc. warts from underneath eyelid  ? INCONTINENCE SURGERY  2004  ? KNEE ARTHROSCOPY Bilateral   ? x 6 each knee  ? LIGAMENT REPAIR Right   ? thumb/wrist  ? ORIF TOE FRACTURE Right   ? great toe  ? PORTACATH PLACEMENT N/A 03/24/2014  ? Procedure: INSERTION PORT-A-CATH;  Surgeon: Excell Seltzer, MD;  Location: [place];  Service: General;  Laterality: N/A;; removed   ? THYROIDECTOMY  2000  ? TONSILLECTOMY AND ADENOIDECTOMY  2000  ? TOTAL KNEE ARTHROPLASTY Left 12/03/2017  ? Procedure: LEFT TOTAL KNEE ARTHROPLASTY;  Surgeon: Gaynelle Arabian,  MD;  Location: [facility];  Service: Orthopedics;  Laterality: Left;  ? TUMOR EXCISION    ? from thoracic spine  ? ?Patient Active Problem List  ? Diagnosis Date Noted  ? Sacral pain 09/20/2021  ? Pain in right knee 09/13/2021  ? Acute left-sided low back pain with left-sided sciatica 04/27/2021  ? Nondisplaced fracture of middle phalanx of left lesser toe(s), initial encounter for closed fracture 09/17/2020  ? Chronic diastolic CHF (congestive heart failure) ([place]) 08/23/2019  ? Retinal artery occlusion 08/22/2019  ? History of total left knee replacement 12/07/2017  ? OA (osteoarthritis) of knee 12/03/2017  ? Obesity 01/01/2017  ? Essential hypertension 10/18/2016  ? Constipation 10/04/2016  ? Vitamin D deficiency 10/04/2016  ? Prediabetes 10/04/2016  ? Obesity, Class III, BMI 40-49.9 (morbid obesity) ([place]) 09/20/2016  ? OSA (obstructive sleep apnea) 02/16/2016  ? Malignant neoplasm of thyroid gland ([place]) 10/11/2015  ? De Quervain's tenosynovitis, bilateral 09/15/2015  ? Trigger finger, acquired 06/22/2015  ? Chemotherapy-induced peripheral neuropathy ([place]) 05/10/2015  ? Postmenopausal osteoporosis 12/08/2014  ? Preventative health care 12/01/2014  ? DVT (deep venous thrombosis) ([place]) 08/28/2014  ? Lymphedema of arm 08/28/2014  ? Mucositis due to chemotherapy 06/02/2014  ? Menopausal syndrome (hot flashes) 12/31/2013  ? Breast cancer of upper-outer quadrant of left female breast ([place]) 12/26/2013  ? 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 Gingivitis 12/05/2013  ? Seasonal allergies 09/24/2013  ? Hyperlipidemia 06/02/2011  ? Migraine 06/02/2011  ? Tinnitus of both ears 02/14/2011  ? Fibromyalgia 05/10/2010  ? Asthma 03/22/2010  ? 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 GERD 12/20/2009  ? Depression 12/01/2009  ? ? ?REFERRING DIAG: sacroillitis; chr Rt LBP; buttock pain ? ?THERAPY DIAG:  ?Other low back pain ? ?Abnormal posture ? ?Cramp and spasm ? ?PERTINENT HISTORY: History of lumpectomy with SLNB  on 02/23/2014, Hx DVT, left TKR, Bil CTR, right knee pain (TKR to be scheduled); vertigo, uterine cancer  ? ?PRECAUTIONS: hx of DVT in the UE, lumpectomy with radiation due to CA ? ?SUBJECTIVE: Patient stating she walked 30 min this morning. ? ?PAIN:  ?Are you having pain? No ? ?OBJECTIVE: ?Observation/Other Assessments   ?  Focus on Therapeutic Outcomes (FOTO)  53 (predicted 40)   ?     ?  Posture/Postural Control  ?  Posture/Postural Control Postural limitations   ?  Postural Limitations Rounded Shoulders   ?  Posture Comments left lateral shift, even pelvic landmarks   ?     ?  ROM / Strength  ?  AROM / PROM / Strength AROM;Strength   ?     ?  AROM  ?  AROM Assessment Site Lumbar   ?  Lumbar Flexion full   ?  Lumbar Extension full   ?  Lumbar - Right Side Bend full   ?  Lumbar - Left Side Bend tight   painful on left  ?  Lumbar - Right Rotation full   ?  Lumbar - Left Rotation full   ?     ?  Strength  ?  Overall Strength Comments B LE grossly 5/5, except R hip ADD/ext 4+/5;  bil hip flexion in sitting 2+/5; in SDLY 5/5   ?     ?  Flexibility  ?  Soft Tissue Assessment /Muscle Length yes   ?  Hamstrings marked bil   ?  Piriformis WNL   ?  Quadratus Lumborum left tight with spasm; tight hip flexors bil   ?     ?  Palpation  ?  Palpation comment bil gluteus min/med tight and with TPs present; tight left QL with pain/spasm   ?  ? ? ? ?TODAY'S TREATMENT:  ?09/27/21 ?Elliptical L2 x 3 ?Standing marching RTB 2x10 R; 1 x 10 L stopped due to pain ?SDLY: clams x 10; reverse clam x 10 Bil ?Prone: Hip offsetting to R 5 sec hold x 7 ?prone press-ups x 10  ?Prone Right arm/Left leg;Left arm/Right leg;10 reps ? ?Manual Therapy: skilled palpation and monitoring of ST during DN. STM to left gluteals. ? ?Trigger Point Dry-Needling  ?Treatment instructions: Expect mild to moderate muscle soreness. S/S of pneumothorax if dry needled over a lung field, and to seek immediate medical attention should they occur. Patient verbalized  understanding of these instructions and education. ? ?Patient Consent Given: Yes ?Education handout provided: Previously provided ?Muscles treated: Gluteus min/med/max ?Electrical stimulation performed:

## 2021-09-29 ENCOUNTER — Encounter: Payer: Self-pay | Admitting: Physical Therapy

## 2021-09-29 ENCOUNTER — Other Ambulatory Visit: Payer: Self-pay

## 2021-09-29 ENCOUNTER — Ambulatory Visit: Payer: Medicare Other | Admitting: Physical Therapy

## 2021-09-29 DIAGNOSIS — M5459 Other low back pain: Secondary | ICD-10-CM | POA: Diagnosis not present

## 2021-09-29 DIAGNOSIS — R293 Abnormal posture: Secondary | ICD-10-CM

## 2021-09-29 DIAGNOSIS — R252 Cramp and spasm: Secondary | ICD-10-CM

## 2021-09-29 NOTE — Therapy (Signed)
?OUTPATIENT PHYSICAL THERAPY TREATMENT NOTE ? ? ?Patient Name: Michelle Kennedy ?MRN: [id number] ?DOB:Oct 17, 1955, 66 y.o., female ?Today's Date: 09/29/2021 ? ?PCP: Debbrah Alar, NP ?REFERRING PROVIDER: Barnet Pall NP/ Joni Fears ? ? PT End of Session - 09/29/21 1017   ? ? Visit Number 7   ? Number of Visits 13   ? Date for PT Re-Evaluation 10/11/21   ? Authorization Type MCR   ? Progress Note Due on Visit 10   ? PT Start Time 1017   ? PT Stop Time 0093   ? PT Time Calculation (min) 46 min   ? Activity Tolerance Patient tolerated treatment well   ? Behavior During Therapy [facility] for tasks assessed/performed   ? ?  ?  ? ?  ? ? ? ?Past Medical History:  ?Diagnosis Date  ? Abnormally small mouth   ? Achilles tendon disorder, right   ? Anemia   ? Anxiety   ? Arthritis   ? hips and knees  ? Asthma   ? daily inhaler, prn inhaler and neb.  ? Asthma due to environmental allergies   ? Breast cancer ([place]) 01/2014  ? left  ? Chest pain   ? CHF (congestive heart failure) ([place])   ? COPD (chronic obstructive pulmonary disease) ([place])   ? Dental bridge present   ? upper front and lower right  ? Dental crowns present   ? x 3  ? Depression   ? Esophageal spasm   ? reports since her chemo and breast surgery  she developed esophageal spasms and reports this is in the past has casue her 02 to desat in the  70s , denies sycnope in relation to this , does report hx of vertigo as well   ? Family history of anesthesia complication   ? twin brother aspirated and died on OR table, per pt.  ? Fibromyalgia   ? Gallbladder disease   ? Gallstones   ? GERD (gastroesophageal reflux disease)   ? Glaucoma   ? H/O blood clots   ? had blood to  PICC line   ? History of gastric ulcer   ? as a teenager  ? History of seizure age 71  ? as a reaction to Penicillin - no seizures since  ? History of stomach ulcers   ? History of thyroid cancer   ? s/p thyroidectomy  ? HTN (hypertension)   ? Hyperlipidemia   ? Hypothyroidism   ? Joint pain   ? Left  knee injury   ? Liver disorder   ? seen when she had her hysterectomy , reports she was told it was  " small lesion" ; but asymptomatic   ? Lymphedema   ? left arm  ? Migraines   ? Multiple food allergies   ? Obesity   ? OSA (obstructive sleep apnea) 02/16/2016  ? CPAP  ? Palpitations   ? reports no longer experiences  ? Personal history of chemotherapy 2015  ? Personal history of radiation therapy 2015  ? Left  ? Prediabetes   ? Rheumatoid arthritis ([place])   ? SOB (shortness of breath)   ? Swelling of both ankles   ? Tinnitus   ? UTI (lower urinary tract infection) 02/17/2014  ? Vertigo   ? last episode  was 2 weeks ago   ? Vitamin D deficiency   ? Wears contact lenses   ? left eye only   ? Wears hearing aid in both ears   ? ?Past  Surgical History:  ?Procedure Laterality Date  ? ABDOMINAL HYSTERECTOMY  2004  ? complete  ? ACHILLES TENDON REPAIR Right   ? APPENDECTOMY  2004  ? BREAST EXCISIONAL BIOPSY    ? BREAST LUMPECTOMY Left   ? 2015  ? BREAST LUMPECTOMY WITH NEEDLE LOCALIZATION AND AXILLARY SENTINEL LYMPH NODE BX Left 02/23/2014  ? Procedure: BREAST LUMPECTOMY WITH NEEDLE LOCALIZATION AND AXILLARY SENTINEL LYMPH NODE BIOPSY;  Surgeon: Excell Seltzer, MD;  Location: [place];  Service: General;  Laterality: Left;  ? BREAST SURGERY  2011  ? left breast biposy  ? CHOLECYSTECTOMY  1990  ? COLONOSCOPY W/ POLYPECTOMY  06/2009  ? EYE SURGERY Right 2013  ? exc. warts from underneath eyelid  ? INCONTINENCE SURGERY  2004  ? KNEE ARTHROSCOPY Bilateral   ? x 6 each knee  ? LIGAMENT REPAIR Right   ? thumb/wrist  ? ORIF TOE FRACTURE Right   ? great toe  ? PORTACATH PLACEMENT N/A 03/24/2014  ? Procedure: INSERTION PORT-A-CATH;  Surgeon: Excell Seltzer, MD;  Location: [place];  Service: General;  Laterality: N/A;; removed   ? THYROIDECTOMY  2000  ? TONSILLECTOMY AND ADENOIDECTOMY  2000  ? TOTAL KNEE ARTHROPLASTY Left 12/03/2017  ? Procedure: LEFT TOTAL KNEE ARTHROPLASTY;  Surgeon: Gaynelle Arabian, MD;  [facility];  Service: Orthopedics;  Laterality: Left;  ? TUMOR EXCISION    ? from thoracic spine  ? ?Patient Active Problem List  ? Diagnosis Date Noted  ? Sacral pain 09/20/2021  ? Pain in right knee 09/13/2021  ? Acute left-sided low back pain with left-sided sciatica 04/27/2021  ? Nondisplaced fracture of middle phalanx of left lesser toe(s), initial encounter for closed fracture 09/17/2020  ? Chronic diastolic CHF (congestive heart failure) ([place]) 08/23/2019  ? Retinal artery occlusion 08/22/2019  ? History of total left knee replacement 12/07/2017  ? OA (osteoarthritis) of knee 12/03/2017  ? Obesity 01/01/2017  ? Essential hypertension 10/18/2016  ? Constipation 10/04/2016  ? Vitamin D deficiency 10/04/2016  ? Prediabetes 10/04/2016  ? Obesity, Class III, BMI 40-49.9 (morbid obesity) ([place]) 09/20/2016  ? OSA (obstructive sleep apnea) 02/16/2016  ? Malignant neoplasm of thyroid gland ([place]) 10/11/2015  ? De Quervain's tenosynovitis, bilateral 09/15/2015  ? Trigger finger, acquired 06/22/2015  ? Chemotherapy-induced peripheral neuropathy ([place]) 05/10/2015  ? Postmenopausal osteoporosis 12/08/2014  ? Preventative health care 12/01/2014  ? DVT (deep venous thrombosis) ([place]) 08/28/2014  ? Lymphedema of arm 08/28/2014  ? Mucositis due to chemotherapy 06/02/2014  ? Menopausal syndrome (hot flashes) 12/31/2013  ? Breast cancer of upper-outer quadrant of left female breast ([place]) 12/26/2013  ? 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 Gingivitis 12/05/2013  ? Seasonal allergies 09/24/2013  ? Hyperlipidemia 06/02/2011  ? Migraine 06/02/2011  ? Tinnitus of both ears 02/14/2011  ? Fibromyalgia 05/10/2010  ? Asthma 03/22/2010  ? 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 GERD 12/20/2009  ? Depression 12/01/2009  ? ? ?REFERRING DIAG: sacroillitis; chr Rt LBP; buttock pain ? ?THERAPY DIAG:  ?Other low back pain ? ?Abnormal posture ? ?Cramp and spasm ? ?PERTINENT HISTORY: History of lumpectomy  with SLNB on 02/23/2014, Hx DVT, left TKR, Bil CTR, right knee pain (TKR to be scheduled); vertigo, uterine cancer  ? ?PRECAUTIONS: hx of DVT in the UE, lumpectomy with radiation due to CA ? ?SUBJECTIVE: Patient stating she walked 30 min this morning. Did 40 min of silver sneakers yesterday.  ? ?PAIN:  ?Are you having pain?  No ? ?OBJECTIVE: From Eval ?Observation/Other Assessments   ?  Focus on Therapeutic Outcomes (FOTO)  53 (predicted 82)   ?     ?  Posture/Postural Control  ?  Posture/Postural Control Postural limitations   ?  Postural Limitations Rounded Shoulders   ?  Posture Comments left lateral shift, even pelvic landmarks   ?     ?  ROM / Strength  ?  AROM / PROM / Strength AROM;Strength   ?     ?  AROM  ?  AROM Assessment Site Lumbar   ?  Lumbar Flexion full   ?  Lumbar Extension full   ?  Lumbar - Right Side Bend full   ?  Lumbar - Left Side Bend tight   painful on left  ?  Lumbar - Right Rotation full   ?  Lumbar - Left Rotation full   ?     ?  Strength updated 09/29/21  ?  Overall Strength Comments B LE grossly 5/5, except R hip ADD/ext 4+/5;  bil hip flexion in sitting 4+/5; in SDLY 5/5   ?     ?  Flexibility  ?  Soft Tissue Assessment /Muscle Length yes   ?  Hamstrings marked bil   ?  Piriformis WNL   ?  Quadratus Lumborum left tight with spasm; tight hip flexors bil   ?     ?  Palpation  ?  Palpation comment bil gluteus min/med tight and with TPs present; tight left QL with pain/spasm   ?  ? ? ? ?TODAY'S TREATMENT:  ?09/29/21 ?Assessed spine - neutral  ?Hooklying: knees up/up/down/down x 10 each side leading 1 set; 90/90 with heel taps x 3 each then felt left sciatic ?Plank 3 x max hold; press ups x 5 5 sec hold; side plank 3 x 5 sec hold bil; prone alt leg lifts x 10 ea ?Standing: squat/chop with yellow weighted ball 1x10 ea; after 5 on second set felt at left PSIS so held. ?M[place] carry with marching 10# each hand, OH carry x 2 laps each arm OH 10#R/8#L  ?Palloff press GTB x 10 bil, dead lift one 10#  wt to stool between legs x 5; then 10# each hand to mid shin x 10 with cues to squeeze gluteals upon standing. ? ? ?09/27/21 ?Elliptical L2 x 3 ?Standing marching RTB 2x10 R; 1 x 10 L stopped due to pain ?S

## 2021-10-03 NOTE — Progress Notes (Signed)
? ? ? ?Chief Complaint:  ? ?OBESITY ?Michelle Kennedy is here to discuss her progress with her obesity treatment plan along with follow-up of her obesity related diagnoses. Michelle Kennedy is on the Category 4 Plan and states she is following her eating plan approximately 75% of the time. Michelle Kennedy states she is doing senior aerobics, walking, and yoga for 60 minutes 5 times per week. ? ?Today's visit was #: 5 ?Starting weight: 275 lbs ?Starting date: 07/28/2021 ?Today's weight: 263 lbs ?Today's date: 09/27/2021 ?Total lbs lost to date: 12 lbs ?Total lbs lost since last in-office visit: 3 lbs ? ?Interim History: Michelle Kennedy has done well with weight loss since her last visit. She is working on meeting her protein and calories goals. She is not skipping meals. She is drinking water and 1 cup of coffee daily.  ? ?Subjective:  ? ?1. Essential hypertension ?Michelle Kennedy is taking Norvasc 5 mg and lisinopril 20 mg. She denies side effects. She denies chest pain, shortness of breath, and palpitations. Her blood pressure at home runs in the range of 130's/70's. ? ?2. Chronic pain of both knees ?Michelle Kennedy goal is to lose weight to have right knee surgery. She recently had hyaluronan injections and is doing better-moving better. Plans to wait until January 2024 to have surgery. She needs to lose <250 lbs to be able to proceed with surgery.  ? ?Assessment/Plan:  ? ?1. Essential hypertension ?Michelle Kennedy will continue to follow up with her primary care provider. She will continue her medications as directed. She is working on healthy weight loss and exercise to improve blood pressure control. We will watch for signs of hypotension as she continues her lifestyle modifications. ? ?2. Chronic pain of both knees ?Michelle Kennedy will continue to follow up with orthopedic. ? ?3. Obesity, current BMI 43.1 ?Michelle Kennedy is currently in the action stage of change. As such, her goal is to continue with weight loss efforts. She has agreed to the Category 4 Plan.  ? ?Exercise goals:  As  is.  ? ?Behavioral modification strategies: increasing lean protein intake, increasing water intake, meal planning and cooking strategies, and travel eating strategies. ? ?Michelle Kennedy has agreed to follow-up with our clinic in 3 weeks. She was informed of the importance of frequent follow-up visits to maximize her success with intensive lifestyle modifications for her multiple health conditions.  ? ?Objective:  ? ?Blood pressure (!) 143/78, pulse 80, temperature (!) 96 ?F (35.6 ?C), height '5\' 6"'$  (1.676 m), weight 263 lb (119.3 kg), SpO2 96 %. ?Body mass index is 42.45 kg/m?. ? ?General: Cooperative, alert, well developed, in no acute distress. ?HEENT: Conjunctivae and lids unremarkable. ?Cardiovascular: Regular rhythm.  ?Lungs: Normal work of breathing. ?Neurologic: No focal deficits.  ? ?Lab Results  ?Component Value Date  ? CREATININE 0.78 04/27/2021  ? BUN 14 04/27/2021  ? NA 141 04/27/2021  ? K 4.2 04/27/2021  ? CL 104 04/27/2021  ? CO2 30 04/27/2021  ? ?Lab Results  ?Component Value Date  ? ALT 27 07/20/2021  ? AST 17 07/20/2021  ? ALKPHOS 76 07/20/2021  ? BILITOT 0.4 07/20/2021  ? ?Lab Results  ?Component Value Date  ? HGBA1C 5.8 (H) 07/28/2021  ? HGBA1C 6.0 (A) 04/22/2021  ? HGBA1C 6.2 10/26/2020  ? HGBA1C 5.8 (H) 03/01/2020  ? HGBA1C 6.1 (H) 08/23/2019  ? ?Lab Results  ?Component Value Date  ? INSULIN 25.6 (H) 07/28/2021  ? INSULIN 21.8 09/20/2016  ? ?Lab Results  ?Component Value Date  ? TSH 0.36 07/20/2021  ? ?Lab  Results  ?Component Value Date  ? CHOL 187 07/28/2021  ? HDL 64 07/28/2021  ? LDLCALC 102 (H) 07/28/2021  ? LDLDIRECT 117.0 02/26/2019  ? TRIG 122 07/28/2021  ? CHOLHDL 3 06/28/2020  ? ?Lab Results  ?Component Value Date  ? VD25OH 53.4 07/28/2021  ? VD25OH 41.4 09/20/2016  ? ?Lab Results  ?Component Value Date  ? WBC 5.1 07/28/2021  ? HGB 14.3 07/28/2021  ? HCT 45.0 07/28/2021  ? MCV 76 (L) 07/28/2021  ? PLT 255 07/28/2021  ? ?Lab Results  ?Component Value Date  ? IRON 71 06/23/2020  ? TIBC 294  06/23/2020  ? FERRITIN 94.6 06/23/2020  ? ?Attestation Statements:  ? ?Reviewed by clinician on day of visit: allergies, medications, problem list, medical history, surgical history, family history, social history, and previous encounter notes. ? ?Spent 30 minutes with patient on above.  ? ?I, Lizbeth Bark, RMA, am acting as Location manager for Everardo Pacific, FNP. ? ?I have reviewed the above documentation for accuracy and completeness, and I agree with the above. Everardo Pacific, FNP  ?

## 2021-10-03 NOTE — Therapy (Signed)
?OUTPATIENT PHYSICAL THERAPY TREATMENT NOTE ? ? ?Patient Name: Michelle Kennedy ?MRN: [id number] ?DOB:[birth year], 66 y.o., female ?Today's Date: 10/04/2021 ? ?PCP: Debbrah Alar, NP ?REFERRING PROVIDER: Barnet Pall NP/ Joni Fears ? ? PT End of Session - 10/04/21 1023   ? ? Visit Number 8   ? Number of Visits 13   ? Date for PT Re-Evaluation 10/11/21   ? Authorization Type MCR   ? Progress Note Due on Visit 10   ? PT Start Time 1022   ? PT Stop Time 1100   ? PT Time Calculation (min) 38 min   ? Activity Tolerance Patient tolerated treatment well   ? Behavior During Therapy [facility] for tasks assessed/performed   ? ?  ?  ? ?  ? ? ? ? ?Past Medical History:  ?Diagnosis Date  ? Abnormally small mouth   ? Achilles tendon disorder, right   ? Anemia   ? Anxiety   ? Arthritis   ? hips and knees  ? Asthma   ? daily inhaler, prn inhaler and neb.  ? Asthma due to environmental allergies   ? Breast cancer ([place]) 01/2014  ? left  ? Chest pain   ? CHF (congestive heart failure) ([place])   ? COPD (chronic obstructive pulmonary disease) ([place])   ? Dental bridge present   ? upper front and lower right  ? Dental crowns present   ? x 3  ? Depression   ? Esophageal spasm   ? reports since her chemo and breast surgery  she developed esophageal spasms and reports this is in the past has casue her 02 to desat in the  70s , denies sycnope in relation to this , does report hx of vertigo as well   ? Family history of anesthesia complication   ? twin brother aspirated and died on OR table, per pt.  ? Fibromyalgia   ? Gallbladder disease   ? Gallstones   ? GERD (gastroesophageal reflux disease)   ? Glaucoma   ? H/O blood clots   ? had blood to  PICC line   ? History of gastric ulcer   ? as a teenager  ? History of seizure age 60  ? as a reaction to Penicillin - no seizures since  ? History of stomach ulcers   ? History of thyroid cancer   ? s/p thyroidectomy  ? HTN (hypertension)   ? Hyperlipidemia   ? Hypothyroidism   ? Joint pain   ? Left  knee injury   ? Liver disorder   ? seen when she had her hysterectomy , reports she was told it was  " small lesion" ; but asymptomatic   ? Lymphedema   ? left arm  ? Migraines   ? Multiple food allergies   ? Obesity   ? OSA (obstructive sleep apnea) 02/16/2016  ? CPAP  ? Palpitations   ? reports no longer experiences  ? Personal history of chemotherapy 2015  ? Personal history of radiation therapy 2015  ? Left  ? Prediabetes   ? Rheumatoid arthritis ([place])   ? SOB (shortness of breath)   ? Swelling of both ankles   ? Tinnitus   ? UTI (lower urinary tract infection) 02/17/2014  ? Vertigo   ? last episode  was 2 weeks ago   ? Vitamin D deficiency   ? Wears contact lenses   ? left eye only   ? Wears hearing aid in both ears   ? ?  Past Surgical History:  ?Procedure Laterality Date  ? ABDOMINAL HYSTERECTOMY  2004  ? complete  ? ACHILLES TENDON REPAIR Right   ? APPENDECTOMY  2004  ? BREAST EXCISIONAL BIOPSY    ? BREAST LUMPECTOMY Left   ? 2015  ? BREAST LUMPECTOMY WITH NEEDLE LOCALIZATION AND AXILLARY SENTINEL LYMPH NODE BX Left 02/23/2014  ? Procedure: BREAST LUMPECTOMY WITH NEEDLE LOCALIZATION AND AXILLARY SENTINEL LYMPH NODE BIOPSY;  Surgeon: Excell Seltzer, MD;  Location: [place];  Service: General;  Laterality: Left;  ? BREAST SURGERY  2011  ? left breast biposy  ? CHOLECYSTECTOMY  1990  ? COLONOSCOPY W/ POLYPECTOMY  06/2009  ? EYE SURGERY Right 2013  ? exc. warts from underneath eyelid  ? INCONTINENCE SURGERY  2004  ? KNEE ARTHROSCOPY Bilateral   ? x 6 each knee  ? LIGAMENT REPAIR Right   ? thumb/wrist  ? ORIF TOE FRACTURE Right   ? great toe  ? PORTACATH PLACEMENT N/A 03/24/2014  ? Procedure: INSERTION PORT-A-CATH;  Surgeon: Excell Seltzer, MD;  Location: [place];  Service: General;  Laterality: N/A;; removed   ? THYROIDECTOMY  2000  ? TONSILLECTOMY AND ADENOIDECTOMY  2000  ? TOTAL KNEE ARTHROPLASTY Left 12/03/2017  ? Procedure: LEFT TOTAL KNEE ARTHROPLASTY;  Surgeon: Gaynelle Arabian, MD;  Location: [facility];  Service: Orthopedics;  Laterality: Left;  ? TUMOR EXCISION    ? from thoracic spine  ? ?Patient Active Problem List  ? Diagnosis Date Noted  ? Sacral pain 09/20/2021  ? Pain in right knee 09/13/2021  ? Acute left-sided low back pain with left-sided sciatica 04/27/2021  ? Nondisplaced fracture of middle phalanx of left lesser toe(s), initial encounter for closed fracture 09/17/2020  ? Chronic diastolic CHF (congestive heart failure) ([place]) 08/23/2019  ? Retinal artery occlusion 08/22/2019  ? History of total left knee replacement 12/07/2017  ? OA (osteoarthritis) of knee 12/03/2017  ? Obesity 01/01/2017  ? Essential hypertension 10/18/2016  ? Constipation 10/04/2016  ? Vitamin D deficiency 10/04/2016  ? Prediabetes 10/04/2016  ? Obesity, Class III, BMI 40-49.9 (morbid obesity) ([place]) 09/20/2016  ? OSA (obstructive sleep apnea) 02/16/2016  ? Malignant neoplasm of thyroid gland ([place]) 10/11/2015  ? De Quervain's tenosynovitis, bilateral 09/15/2015  ? Trigger finger, acquired 06/22/2015  ? Chemotherapy-induced peripheral neuropathy ([place]) 05/10/2015  ? Postmenopausal osteoporosis 12/08/2014  ? Preventative health care 12/01/2014  ? DVT (deep venous thrombosis) ([place]) 08/28/2014  ? Lymphedema of arm 08/28/2014  ? Mucositis due to chemotherapy 06/02/2014  ? Menopausal syndrome (hot flashes) 12/31/2013  ? Breast cancer of upper-outer quadrant of left female breast ([place]) 12/26/2013  ? 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 Gingivitis 12/05/2013  ? Seasonal allergies 09/24/2013  ? Hyperlipidemia 06/02/2011  ? Migraine 06/02/2011  ? Tinnitus of both ears 02/14/2011  ? Fibromyalgia 05/10/2010  ? Asthma 03/22/2010  ? 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 GERD 12/20/2009  ? Depression 12/01/2009  ? ? ?REFERRING DIAG: sacroillitis; chr Rt LBP; buttock pain ? ?THERAPY DIAG:  ?Other low back pain ? ?Abnormal posture ? ?Cramp and spasm ? ?PERTINENT HISTORY: History of lumpectomy  with SLNB on 02/23/2014, Hx DVT, left TKR, Bil CTR, right knee pain (TKR to be scheduled); vertigo, uterine cancer  ? ?PRECAUTIONS: hx of DVT in the UE, lumpectomy with radiation due to CA ? ?SUBJECTIVE: Patient reports she is ready for d/c. ? ?PAIN:  ?Are you having pain? No ? ?OBJECTIVE: From Eval ?Observation/Other Assessments   ?  Focus on Therapeutic Outcomes (FOTO)  53 (predicted 45)   ?    66 on visit 8 (10/04/21)  ?  Posture/Postural Control  ?  Posture/Postural Control Postural limitations   ?  Postural Limitations Rounded Shoulders   ?  Posture Comments left lateral shift, even pelvic landmarks   ?     ?  ROM / Strength  ?  AROM / PROM / Strength AROM;Strength   ?     ?  AROM  ?  AROM Assessment Site Lumbar   ?  Lumbar Flexion full   ?  Lumbar Extension full   ?  Lumbar - Right Side Bend full   ?  Lumbar - Left Side Bend tight   painful on left  ?  Lumbar - Right Rotation full   ?  Lumbar - Left Rotation full   ?     ?  Strength updated 09/29/21  ?  Overall Strength Comments B LE grossly 5/5, except R hip ADD/ext 4+/5;  bil hip flexion in sitting 4+/5; in SDLY 5/5   ?     ?  Flexibility  ?  Soft Tissue Assessment /Muscle Length yes   ?  Hamstrings marked bil   ?  Piriformis WNL   ?  Quadratus Lumborum left tight with spasm; tight hip flexors bil   ?     ?  Palpation  ?  Palpation comment bil gluteus min/med tight and with TPs present; tight left QL with pain/spasm   ?  ? ? ? ?TODAY'S TREATMENT:  ? ?10/04/21 ? Hooklying: knees up/up/down/down x 10 ; 90/90 with heel taps x 10 each  ?Reviewed side plank and PT demonstrated alternate techniques; squats 8# each x 10; also demontrated goblet squat position and with bil hand weights.  ?Standing: squat/chop with 8# 1x10 ea ?Reviewed farmer carry with marching 8# each hand and left arm wt held at shoulder due to strength deficits.  ?Body mechanics and ADL modifications reviewed (see h/o) ? ?09/29/21 ?Assessed spine - neutral  ?Hooklying: knees up/up/down/down x 10 each  side leading 1 set; 90/90 with heel taps x 3 each then felt left sciatic ?Plank 3 x max hold; press ups x 5 5 sec hold; side plank 3 x 5 sec hold bil; prone alt leg lifts x 10 ea ?Standing: squat/chop w

## 2021-10-04 ENCOUNTER — Ambulatory Visit: Payer: Medicare Other | Admitting: Physical Therapy

## 2021-10-04 DIAGNOSIS — M5459 Other low back pain: Secondary | ICD-10-CM | POA: Diagnosis not present

## 2021-10-04 DIAGNOSIS — R293 Abnormal posture: Secondary | ICD-10-CM

## 2021-10-04 DIAGNOSIS — R252 Cramp and spasm: Secondary | ICD-10-CM

## 2021-10-24 ENCOUNTER — Ambulatory Visit (INDEPENDENT_AMBULATORY_CARE_PROVIDER_SITE_OTHER): Payer: Medicare Other | Admitting: Bariatrics

## 2021-10-26 ENCOUNTER — Ambulatory Visit: Payer: Medicare Other | Admitting: Family

## 2021-10-28 ENCOUNTER — Ambulatory Visit (HOSPITAL_BASED_OUTPATIENT_CLINIC_OR_DEPARTMENT_OTHER)
Admission: RE | Admit: 2021-10-28 | Discharge: 2021-10-28 | Disposition: A | Discharge status: Home / Self Care | Payer: Medicare Other | Source: Ambulatory Visit | Attending: Family | Admitting: Family

## 2021-10-28 ENCOUNTER — Ambulatory Visit (INDEPENDENT_AMBULATORY_CARE_PROVIDER_SITE_OTHER): Payer: Medicare Other | Admitting: Family

## 2021-10-28 VITALS — BP 144/74 | HR 66 | Temp 98.1°F | Resp 16 | Wt 273.0 lb

## 2021-10-28 DIAGNOSIS — K219 Gastro-esophageal reflux disease without esophagitis: Secondary | ICD-10-CM

## 2021-10-28 DIAGNOSIS — I10 Essential (primary) hypertension: Secondary | ICD-10-CM | POA: Diagnosis not present

## 2021-10-28 DIAGNOSIS — I5032 Chronic diastolic (congestive) heart failure: Secondary | ICD-10-CM

## 2021-10-28 DIAGNOSIS — M25572 Pain in left ankle and joints of left foot: Secondary | ICD-10-CM

## 2021-10-28 DIAGNOSIS — R739 Hyperglycemia, unspecified: Secondary | ICD-10-CM

## 2021-10-28 DIAGNOSIS — J4540 Moderate persistent asthma, uncomplicated: Secondary | ICD-10-CM

## 2021-10-28 DIAGNOSIS — G43009 Migraine without aura, not intractable, without status migrainosus: Secondary | ICD-10-CM | POA: Diagnosis not present

## 2021-10-28 DIAGNOSIS — E782 Mixed hyperlipidemia: Secondary | ICD-10-CM

## 2021-10-28 DIAGNOSIS — G4733 Obstructive sleep apnea (adult) (pediatric): Secondary | ICD-10-CM

## 2021-10-28 NOTE — Assessment & Plan Note (Signed)
Stable on pantoprazole. Continue same.  

## 2021-10-28 NOTE — Progress Notes (Signed)
Subjective:   By signing my name below, I, [name] Perches, attest that this documentation has been prepared under the direction and in the presence of Debbrah Alar NP, 10/28/2021   Patient ID: Michelle Kennedy, female    DOB: Aug 24, 1955, 66 y.o.   MRN: [id number]  Chief Complaint  Patient presents with   Hypertension    Here for follow up   Hypothyroidism    Here for follow up   Ankle Pain    Complains of left ankle pain after twisting while walking.     Patient is in today for an office visit. She is accompanied by her partner.   Ankle Pain - She complains of pain in her left ankle. She twisted her ankle about 5 weeks ago and 2 days ago. She is currently wearing a brace but symptoms are not improving. Her partner states that she used a boot previously which helped improve symptoms but it's not practical.  Blood Pressure - As of today's visit, her blood pressure is about the same. She states that her systolic readings at home range around the 140's. She also states that 3 or 4 weeks ago, her blood pressure elevated and she had trouble with her vision. She is currently taking 40 Mg of Lisinopril and 5 Mg of Amlodipine. BP Readings from Last 3 Encounters:  10/28/21 (!) 144/74  09/27/21 (!) 143/78  09/20/21 136/72   Pulse Readings from Last 3 Encounters:  10/28/21 66  09/27/21 80  09/20/21 70   Blood Sugar - She states that on occasions her blood sugar will shoot up and then go back down. She is concerned about whether her blood sugar medications are influencing her blood pressure readings.  Weight - Her weight is decreasing but she states that she was on a trip recently which is the contributing factor to her recent weight gain.  Wt Readings from Last 3 Encounters:  10/28/21 273 lb (123.8 kg)  09/27/21 263 lb (119.3 kg)  09/20/21 267 lb (121.1 kg)   Reflux - Her reflux is mostly controlled. She will occasionally eat something her partner makes and that would cause some  flare up. She is currently taking 40 Mg of Protonix  Migraines - Her partner states that she has had a migraine about two months ago. He reports that symptoms are less frequent than they used to be. Cholesterol - She is currently taking 5 Mg of Crestor Lab Results  Component Value Date   CHOL 187 07/28/2021   HDL 64 07/28/2021   LDLCALC 102 (H) 07/28/2021   LDLDIRECT 117.0 02/26/2019   TRIG 122 07/28/2021   CHOLHDL 3 06/28/2020   Sleep - She went to a sleep specialist in the past. She is currently wearing a Cpap. Her husband reports that she hasn't seen a pulmonologist in about a year.  Asthma - She states that her asthma is mostly controlled. She also  states that on the her recent trip, she had some flare up but it was mostly controlled. She is currently taking 108 MCG/ACT of Albuterol.  Swelling - She states that she takes 20 Mg of Lasix about once a month. She reports that swelling has worsen in her lower legs.  Tailbone - Her tailbone pain is resolved. Thyroid - She states that Dr. Cruzita Lederer wanted to lower her 200 MCG of Synthroid medication.   There are no preventive care reminders to display for this patient.  Past Medical History:  Diagnosis Date   Abnormally small mouth  Achilles tendon disorder, right    Anemia    Anxiety    Arthritis    hips and knees   Asthma    daily inhaler, prn inhaler and neb.   Asthma due to environmental allergies    Breast cancer ([place]) 01/2014   left   Chest pain    CHF (congestive heart failure) ([facility])    COPD (chronic obstructive pulmonary disease) ([facility])    Dental bridge present    upper front and lower right   Dental crowns present    x 3   Depression    Esophageal spasm    reports since her chemo and breast surgery  she developed esophageal spasms and reports this is in the past has casue her 02 to desat in the  70s , denies sycnope in relation to this , does report hx of vertigo as well    Family history of anesthesia complication     twin brother aspirated and died on OR table, per pt.   Fibromyalgia    Gallbladder disease    Gallstones    GERD (gastroesophageal reflux disease)    Glaucoma    H/O blood clots    had blood to  PICC line    History of gastric ulcer    as a teenager   History of seizure age 68   as a reaction to Penicillin - no seizures since   History of stomach ulcers    History of thyroid cancer    s/p thyroidectomy   HTN (hypertension)    Hyperlipidemia    Hypothyroidism    Joint pain    Left knee injury    Liver disorder    seen when she had her hysterectomy , reports she was told it was  " small lesion" ; but asymptomatic    Lymphedema    left arm   Migraines    Multiple food allergies    Obesity    OSA (obstructive sleep apnea) 02/16/2016   CPAP   Palpitations    reports no longer experiences   Personal history of chemotherapy 2015   Personal history of radiation therapy 2015   Left   Prediabetes    Rheumatoid arthritis ([facility])    SOB (shortness of breath)    Swelling of both ankles    Tinnitus    UTI (lower urinary tract infection) 02/17/2014   Vertigo    last episode  was 2 weeks ago    Vitamin D deficiency    Wears contact lenses    left eye only    Wears hearing aid in both ears     Past Surgical History:  Procedure Laterality Date   ABDOMINAL HYSTERECTOMY  2004   complete   ACHILLES TENDON REPAIR Right    APPENDECTOMY  2004   BREAST EXCISIONAL BIOPSY     BREAST LUMPECTOMY Left    2015   BREAST LUMPECTOMY WITH NEEDLE LOCALIZATION AND AXILLARY SENTINEL LYMPH NODE BX Left 02/23/2014   Procedure: BREAST LUMPECTOMY WITH NEEDLE LOCALIZATION AND AXILLARY SENTINEL LYMPH NODE BIOPSY;  Surgeon: Excell Seltzer, MD;  Location: [place];  Service: General;  Laterality: Left;   BREAST SURGERY  2011   left breast biposy   CHOLECYSTECTOMY  1990   COLONOSCOPY W/ POLYPECTOMY  06/2009   EYE SURGERY Right 2013   exc. warts from underneath eyelid    INCONTINENCE SURGERY  2004   KNEE ARTHROSCOPY Bilateral    x 6 each knee   LIGAMENT  REPAIR Right    thumb/wrist   ORIF TOE FRACTURE Right    great toe   PORTACATH PLACEMENT N/A 03/24/2014   Procedure: INSERTION PORT-A-CATH;  Surgeon: Excell Seltzer, MD;  Location: [place];  Service: General;  Laterality: N/A;; removed    THYROIDECTOMY  2000   TONSILLECTOMY AND ADENOIDECTOMY  2000   TOTAL KNEE ARTHROPLASTY Left 12/03/2017   Procedure: LEFT TOTAL KNEE ARTHROPLASTY;  Surgeon: Gaynelle Arabian, MD;  Location[facility];  Service: Orthopedics;  Laterality: Left;   TUMOR EXCISION     from thoracic spine    Family History  Problem Relation Age of Onset   Alcohol abuse Mother    Arthritis Mother    Hypertension Mother    Bipolar disorder Mother    Breast cancer Mother 38       unconfirmed   Lung cancer Mother 52       smoker   Hyperlipidemia Mother    Stroke Mother    Cancer Mother    Depression Mother    Anxiety disorder Mother    Alcoholism Mother    Drug abuse Mother    Eating disorder Mother    Obesity Mother    Alcohol abuse Father    Hyperlipidemia Father    Kidney disease Father    Diabetes Father    Hypertension Father    Cystic kidney disease Father    Thyroid disease Father    Liver disease Father    Alcoholism Father    Lung cancer Father    Thyroid cancer Sister 25       type?; currently 46   Other Sister        ovarian tumor @ 76; TAH/BSO   Breast cancer Sister    Thyroid cancer Brother        dx 46s; currently 1   Breast cancer Maternal Aunt        dx 20s; deceased 67   Thyroid cancer Paternal 19        All 3 paternal aunts with thyroid ca in 30s/40s   Lung cancer Paternal Aunt        2 of 3 paternal aunts with lung cancer   Arthritis Maternal Grandmother    Diabetes Paternal Grandmother    Heart disease Other    COPD Other    Asthma Other     Social History   Socioeconomic History   Marital status: Married    Spouse name:  [name]   Number of children: 2   Years of education: Not on file   Highest education level: Not on file  Occupational History   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   Social Determinants of Health   Financial Resource Strain: Not on file  Food Insecurity: No Food Insecurity (01/12/2021)   Hunger Vital Sign    Worried About Running  Out of Food in the Last Year: Never true    Ran Out of Food in the Last Year: Never true  Transportation Needs: Not on file  Physical Activity: Not on file  Stress: Not on file  Social Connections: Not on file  Intimate Partner Violence: Not on file    Outpatient Medications Prior to Visit  Medication Sig Dispense Refill   albuterol (PROVENTIL) (5 MG/ML) 0.5% nebulizer solution Take 0.5 mLs (2.5 mg total) by nebulization every 6 (six) hours as needed for wheezing or shortness of breath. 40 mL 3   albuterol (VENTOLIN HFA) 108 (90 Base) MCG/ACT inhaler USE 2 INHALATIONS ORALLY   EVERY 6 HOURS AS NEEDED FORWHEEZING OR SHORTNESS OF   BREATH. 3 each 1   anastrozole (ARIMIDEX) 1 MG tablet Take 1 tablet (1 mg total) by mouth daily. 90 tablet 3   CALCIUM PO Take 1 tablet by mouth every evening.     cholecalciferol (VITAMIN D3) 25 MCG (1000 UNIT) tablet Take 1,000 Units by mouth every evening.      clopidogrel (PLAVIX) 75 MG tablet Take 1 tablet (75 mg total) by mouth daily. 90 tablet 1   furosemide (LASIX) 20 MG tablet TAKE 1 TABLET(20 MG) BY MOUTH DAILY  AS NEEDED FOR SWELLING 30 tablet 3   gabapentin (NEURONTIN) 100 MG capsule Take 2 capsules (200 mg total) by mouth 3 (three) times daily. 540 capsule 1   GEMTESA 75 MG TABS Take 1 tablet by mouth daily.     latanoprost (XALATAN) 0.005 % ophthalmic solution 1 drop at bedtime.     levothyroxine (SYNTHROID) 200 MCG tablet Take 1 tablet (200 mcg total) by mouth daily. 90 tablet 3   lisinopril (ZESTRIL) 20 MG tablet Take 2 tablets (40 mg total) by mouth daily. 135 tablet 1   meclizine (ANTIVERT) 25 MG tablet Take 1 tablet (25 mg total) by mouth 3 (three) times daily as needed for dizziness. 30 tablet 0   metFORMIN (GLUCOPHAGE) 500 MG tablet Take 1 tablet (500 mg total) by mouth 2 (two) times daily with a meal. 180 tablet 2   methocarbamol (ROBAXIN) 500 MG tablet Take 1 tablet (500 mg total) by mouth 3 (three) times daily. 90 tablet 0   Multiple Vitamin (MULTIVITAMIN ADULT) TABS Take 1 tablet by mouth daily.     Multiple Vitamins-Minerals (OCUVITE PO) Take 1 tablet by mouth daily.     Omega-3 Fatty Acids (FISH OIL) 1000 MG CAPS Take 1 capsule by mouth every evening.      pantoprazole (PROTONIX) 40 MG tablet Take 1 tablet (40 mg total) by mouth daily. 90 tablet 1   rosuvastatin (CRESTOR) 5 MG tablet Take 1 tablet (5 mg total) by mouth daily. 90 tablet 3   traMADol (ULTRAM) 50 MG tablet Take 1 tablet (50 mg total) by mouth every 8 (eight) hours as needed for moderate pain or severe pain. 21 tablet 0   WIXELA INHUB 500-50 MCG/ACT AEPB INHALE 1 PUFF INTO THE LUNGS TWICE DAILY 60 each 6   amLODipine (NORVASC) 5 MG tablet Take 1 tablet (5 mg total) by mouth daily. 90 tablet 1   No facility-administered medications prior to visit.    Allergies  Allergen Reactions   Bee Venom Anaphylaxis   Codeine Shortness Of Breath and Rash   Contrast Media [Iodinated Contrast Media] Shortness Of Breath   Gadolinium Derivatives Hives, Rash, Shortness Of Breath and Swelling    "my heart stopped beating"    Iodine  Other (See Comments)  CARDIAC ARREST   Latex Anaphylaxis and Rash   Lidocaine Shortness Of Breath and Swelling    SWELLING OF MOUTH AND THROAT, low BP   Penicillins Shortness Of Breath, Rash and Other (See Comments)    SEIZURE Did it involve swelling of the face/tongue/throat, SOB, or low BP? no Did it involve sudden or severe rash/hives, skin peeling, or any reaction on the inside of your mouth or nose? yes Did you need to seek medical attention at a hospital or doctor's office? yes When did it last happen?      2010 If all above answers are "NO", may proceed with cephalosporin use.    Pentazocine Lactate Shortness Of Breath and Rash   Shellfish Allergy Shortness Of Breath and Rash   Aspirin Rash and Other (See Comments)    GI UPSET   Erythromycin Swelling and Rash    SWELLING OF JOINTS   Symbicort [Budesonide-Formoterol Fumarate] Other (See Comments)    BURNING OF TONGUE AND LIPS   Betadine [Povidone Iodine]     Rash. Breathing problems.    Compazine [Prochlorperazine Maleate] Rash    Rash on face,chest, arms, back   Sulfonamide Derivatives Rash    Review of Systems  Musculoskeletal:  Positive for joint pain (Left Ankle Pain).       Objective:    Physical Exam Constitutional:      General: She is not in acute distress.    Appearance: Normal appearance. She is not ill-appearing.  HENT:     Head: Normocephalic and atraumatic.     Right Ear: External ear normal.     Left Ear: External ear normal.  Eyes:     Extraocular Movements: Extraocular movements intact.     Pupils: Pupils are equal, round, and reactive to light.  Cardiovascular:     Rate and Rhythm: Normal rate and regular rhythm.     Heart sounds: Normal heart sounds. No murmur heard.    No gallop.  Pulmonary:     Effort: Pulmonary effort is normal. No respiratory distress.     Breath sounds: Normal breath sounds. No wheezing or rales.  Musculoskeletal:     Right lower leg: 3+ Edema present.     Left  lower leg: 3+ Edema present.  Skin:    General: Skin is warm and dry.  Neurological:     Mental Status: She is alert and oriented to person, place, and time.  Psychiatric:        Mood and Affect: Mood normal.        Behavior: Behavior normal.        Judgment: Judgment normal.     BP (!) 144/74 (BP Location: Right Arm, Patient Position: Sitting, Cuff Size: Large)   Pulse 66   Temp 98.1 F (36.7 C) (Oral)   Resp 16   Wt 273 lb (123.8 kg)   SpO2 98%   BMI 44.06 kg/m  Wt Readings from Last 3 Encounters:  10/28/21 273 lb (123.8 kg)  09/27/21 263 lb (119.3 kg)  09/20/21 267 lb (121.1 kg)       Assessment & Plan:   Problem List Items Addressed This Visit       Unprioritized   Chronic diastolic CHF (congestive heart failure) ([place]) (Chronic)    She is not currently using furosemide regularly.   Wt Readings from Last 3 Encounters:  10/28/21 273 lb (123.8 kg)  09/27/21 263 lb (119.3 kg)  09/20/21 267 lb (121.1 kg)  OSA (obstructive sleep apnea)    She does wear cpap.  Followed by sleep specialist.       Migraine    Stable, monitor.       Hyperlipidemia    Lab Results  Component Value Date   CHOL 187 07/28/2021   HDL 64 07/28/2021   LDLCALC 102 (H) 07/28/2021   LDLDIRECT 117.0 02/26/2019   TRIG 122 07/28/2021   CHOLHDL 3 06/28/2020  Stable on rosuvastatin. Continue same.       GERD    Stable on pantoprazole. Continue same.       Essential hypertension    BP Readings from Last 3 Encounters:  10/28/21 (!) 144/74  09/27/21 (!) 143/78  09/20/21 136/72  Will work on diet rather than med change.  Follow up in 3 months.        Relevant Orders   Basic metabolic panel (Completed)   Asthma    Stable on wixela and prn albuterol. Continue same.       Other Visit Diagnoses     Hyperglycemia    -  Primary   Relevant Orders   Hemoglobin A1c (Completed)   Acute left ankle pain       Relevant Orders   DG Ankle Complete Left (Completed)       No  orders of the defined types were placed in this encounter.   I, Nance Pear, NP, personally preformed the services described in this documentation.  All medical record entries made by the scribe were at my direction and in my presence.  I have reviewed the chart and discharge instructions (if applicable) and agree that the record reflects my personal performance and is accurate and complete. 10/28/2021  I,Amber Collins,acting as a scribe for Nance Pear, NP.,have documented all relevant documentation on the behalf of Nance Pear, NP,as directed by  Nance Pear, NP while in the presence of Nance Pear, NP.  Nance Pear, NP

## 2021-10-28 NOTE — Assessment & Plan Note (Signed)
She does wear cpap.  Followed by sleep specialist.

## 2021-10-28 NOTE — Assessment & Plan Note (Signed)
Lab Results  Component Value Date   CHOL 187 07/28/2021   HDL 64 07/28/2021   LDLCALC 102 (H) 07/28/2021   LDLDIRECT 117.0 02/26/2019   TRIG 122 07/28/2021   CHOLHDL 3 06/28/2020   Stable on rosuvastatin. Continue same.

## 2021-10-28 NOTE — Assessment & Plan Note (Signed)
Stable on wixela and prn albuterol. Continue same.

## 2021-10-28 NOTE — Patient Instructions (Signed)
Weight daily. Take lasix if you develop 3 pound weight gain overnight or 5 pounds in 1 week. Take once daily until you return to baseline weight.

## 2021-10-28 NOTE — Assessment & Plan Note (Signed)
Stable, monitor.  °

## 2021-10-28 NOTE — Assessment & Plan Note (Signed)
She is not currently using furosemide regularly.   Wt Readings from Last 3 Encounters:  10/28/21 273 lb (123.8 kg)  09/27/21 263 lb (119.3 kg)  09/20/21 267 lb (121.1 kg)

## 2021-10-28 NOTE — Assessment & Plan Note (Signed)
BP Readings from Last 3 Encounters:  10/28/21 (!) 144/74  09/27/21 (!) 143/78  09/20/21 136/72   Will work on diet rather than med change.  Follow up in 3 months.

## 2021-10-29 LAB — HEMOGLOBIN A1C
Hgb A1c MFr Bld: 5.6 % of total Hgb (ref <5.7)
Mean Plasma Glucose: 114 mg/dL
eAG (mmol/L): 6.3 mmol/L

## 2021-10-29 LAB — BASIC METABOLIC PANEL
BUN: 13 mg/dL (ref 7–25)
CO2: 27 mmol/L (ref 20–32)
Calcium: 9.1 mg/dL (ref 8.6–10.4)
Chloride: 108 mmol/L (ref 98–110)
Creat: 0.75 mg/dL (ref 0.50–1.05)
Glucose, Bld: 105 mg/dL — ABNORMAL HIGH (ref 65–99)
Potassium: 4.2 mmol/L (ref 3.5–5.3)
Sodium: 141 mmol/L (ref 135–146)

## 2021-10-30 ENCOUNTER — Other Ambulatory Visit: Payer: Self-pay | Admitting: Family

## 2021-11-07 ENCOUNTER — Encounter (INDEPENDENT_AMBULATORY_CARE_PROVIDER_SITE_OTHER): Payer: Self-pay | Admitting: Bariatrics

## 2021-11-07 ENCOUNTER — Ambulatory Visit (INDEPENDENT_AMBULATORY_CARE_PROVIDER_SITE_OTHER): Payer: Medicare Other | Admitting: Bariatrics

## 2021-11-07 VITALS — BP 113/74 | HR 80 | Temp 97.8°F | Ht 66.0 in | Wt 262.0 lb

## 2021-11-07 DIAGNOSIS — E669 Obesity, unspecified: Secondary | ICD-10-CM | POA: Diagnosis not present

## 2021-11-07 DIAGNOSIS — I10 Essential (primary) hypertension: Secondary | ICD-10-CM | POA: Diagnosis not present

## 2021-11-07 DIAGNOSIS — Z6841 Body Mass Index (BMI) 40.0 and over, adult: Secondary | ICD-10-CM | POA: Diagnosis not present

## 2021-11-08 NOTE — Progress Notes (Unsigned)
Chief Complaint:   OBESITY Michelle Kennedy is here to discuss her progress with her obesity treatment plan along with follow-up of her obesity related diagnoses. Michelle Kennedy is on the Category 4 Plan and states she is following her eating plan approximately 80% of the time. Michelle Kennedy states she is doing aerobics  for 30 minutes 5 times per week.  Today's visit was #: 6 Starting weight: 275 lbs Starting date: 07/28/2021 Today's weight: 262 lbs Today's date: 11/07/2021 Total lbs lost to date: 13 Total lbs lost since last in-office visit: 1  Interim History: Michelle Kennedy is down 1 pound since her last visit, but she has done well overall.  She had been on vacation.  She is retaining some fluid in her legs.  Subjective:   1. Essential hypertension Michelle Kennedy is currently on medications and her blood pressure is controlled.  Assessment/Plan:   1. Essential hypertension Michelle Kennedy will continue her medications as directed.  2. Obesity, current BMI 42.3 Michelle Kennedy is currently in the action stage of change. As such, her goal is to continue with weight loss efforts. She has agreed to the Category 4 Plan.   Michelle Kennedy will adhere closely to the plan 80-90%.  No planning.  She will continue her Lasix.  If weight loss is not adequate we will repeat IC test.  Exercise goals: As is.  Behavioral modification strategies: increasing lean protein intake, decreasing simple carbohydrates, increasing vegetables, increasing water intake, decreasing eating out, no skipping meals, meal planning and cooking strategies, keeping healthy foods in the home, and planning for success.  Michelle Kennedy has agreed to follow-up with our clinic in 3 weeks with myself, or Michelle Pacific, FNP. She was informed of the importance of frequent follow-up visits to maximize her success with intensive lifestyle modifications for her multiple health conditions.   Objective:   Blood pressure 113/74, pulse 80, temperature 97.8 F (36.6 C), height 5'  6" (1.676 m), weight 262 lb (118.8 kg), SpO2 95 %. Body mass index is 42.29 kg/m.  Left ankle brace (history of torn ligament) General: Cooperative, alert, well developed, in no acute distress. HEENT: Conjunctivae and lids unremarkable. Cardiovascular: Regular rhythm.  Lungs: Normal work of breathing. Neurologic: No focal deficits.   Lab Results  Component Value Date   CREATININE 0.75 10/28/2021   BUN 13 10/28/2021   NA 141 10/28/2021   K 4.2 10/28/2021   CL 108 10/28/2021   CO2 27 10/28/2021   Lab Results  Component Value Date   ALT 27 07/20/2021   AST 17 07/20/2021   ALKPHOS 76 07/20/2021   BILITOT 0.4 07/20/2021   Lab Results  Component Value Date   HGBA1C 5.6 10/28/2021   HGBA1C 5.8 (H) 07/28/2021   HGBA1C 6.0 (A) 04/22/2021   HGBA1C 6.2 10/26/2020   HGBA1C 5.8 (H) 03/01/2020   Lab Results  Component Value Date   INSULIN 25.6 (H) 07/28/2021   INSULIN 21.8 09/20/2016   Lab Results  Component Value Date   TSH 0.36 07/20/2021   Lab Results  Component Value Date   CHOL 187 07/28/2021   HDL 64 07/28/2021   LDLCALC 102 (H) 07/28/2021   LDLDIRECT 117.0 02/26/2019   TRIG 122 07/28/2021   CHOLHDL 3 06/28/2020   Lab Results  Component Value Date   VD25OH 53.4 07/28/2021   VD25OH 41.4 09/20/2016   Lab Results  Component Value Date   WBC 5.1 07/28/2021   HGB 14.3 07/28/2021   HCT 45.0 07/28/2021   MCV 76 (L) 07/28/2021  PLT 255 07/28/2021   Lab Results  Component Value Date   IRON 71 06/23/2020   TIBC 294 06/23/2020   FERRITIN 94.6 06/23/2020   Attestation Statements:   Reviewed by clinician on day of visit: allergies, medications, problem list, medical history, surgical history, family history, social history, and previous encounter notes.   Wilhemena Durie, am acting as Location manager for [employer], DO.  I have reviewed the above documentation for accuracy and completeness, and I agree with the above. -  ***

## 2021-11-12 ENCOUNTER — Encounter (INDEPENDENT_AMBULATORY_CARE_PROVIDER_SITE_OTHER): Payer: Self-pay | Admitting: Bariatrics

## 2021-12-05 ENCOUNTER — Ambulatory Visit (INDEPENDENT_AMBULATORY_CARE_PROVIDER_SITE_OTHER): Payer: Medicare Other | Admitting: Bariatrics

## 2021-12-05 ENCOUNTER — Encounter (INDEPENDENT_AMBULATORY_CARE_PROVIDER_SITE_OTHER): Payer: Self-pay | Admitting: Bariatrics

## 2021-12-05 VITALS — BP 111/64 | HR 74 | Temp 97.5°F | Ht 66.0 in | Wt 259.0 lb

## 2021-12-05 DIAGNOSIS — I10 Essential (primary) hypertension: Secondary | ICD-10-CM

## 2021-12-05 DIAGNOSIS — Z6841 Body Mass Index (BMI) 40.0 and over, adult: Secondary | ICD-10-CM

## 2021-12-05 DIAGNOSIS — R7303 Prediabetes: Secondary | ICD-10-CM

## 2021-12-05 DIAGNOSIS — E669 Obesity, unspecified: Secondary | ICD-10-CM

## 2021-12-05 MED ORDER — SEMAGLUTIDE(0.25 OR 0.5MG/DOS) 2 MG/3ML ~~LOC~~ SOPN: PEN_INJECTOR | SUBCUTANEOUS | 0 refills | Status: DC

## 2021-12-05 MED ORDER — ONDANSETRON HCL 4 MG PO TABS: 4.0000 mg | ORAL_TABLET | Freq: Three times a day (TID) | ORAL | 0 refills | Status: DC | PRN

## 2021-12-06 ENCOUNTER — Encounter: Payer: Self-pay | Admitting: Family

## 2021-12-06 DIAGNOSIS — G4733 Obstructive sleep apnea (adult) (pediatric): Secondary | ICD-10-CM

## 2021-12-06 NOTE — Progress Notes (Signed)
Chief Complaint:   OBESITY Michelle Kennedy is here to discuss her progress with her obesity treatment plan along with follow-up of her obesity related diagnoses. Michelle Kennedy is on practicing portion control and making smarter food choices, such as increasing vegetables and decreasing simple carbohydrates and states she is following her eating plan approximately 70% of the time. Michelle Kennedy states she is doinf aerobics and pool walkign for 30-40 minutes 2 times per week.  Today's visit was #: 7 Starting weight: 275 lbs Starting date: 07/28/2021 Today's weight: 259 lbs Today's date: 12/05/2021 Total lbs lost to date: 16 Total lbs lost since last in-office visit: 3  Interim History: Michelle Kennedy is down 3 pounds since her last visit.  Subjective:   1. Essential hypertension Michelle Kennedy is taking Norvasc and Zestril, and her blood pressure is well controlled.  2. Pre-diabetes Michelle Kennedy is taking metformin.  She denies contraindications.  Assessment/Plan:   1. Essential hypertension Adreonna will continue her medications, diet, and exercise.  She is to eliminate added salt.  2. Pre-diabetes Michelle Kennedy will continue metformin, and she agreed to start Ozempic 0.25 mg once weekly with no refills (prior authorization needed); start Zofran 4 mg every 8 hours as needed for nausea or vomiting with no refills.  She will continue to work on her meal plan and exercise.  Low carbohydrate snacks were discussed.  - Semaglutide,0.25 or 0.5MG /DOS, 2 MG/3ML SOPN; Inject 0.25 mg into the skin weekly  Dispense: 3 mL; Refill: 0 - ondansetron (ZOFRAN) 4 MG tablet; Take 1 tablet (4 mg total) by mouth every 8 (eight) hours as needed for nausea or vomiting.  Dispense: 20 tablet; Refill: 0  3. Obesity, current BMI 41.8 Michelle Kennedy is currently in the action stage of change. As such, her goal is to continue with weight loss efforts. She has agreed to the Category 4 Plan.   She will adhere closely to the plan.  Exercise goals: As is.    Behavioral modification strategies: increasing lean protein intake, decreasing simple carbohydrates, increasing vegetables, and increasing water intake.  Pasha has agreed to follow-up with our clinic in 4 weeks with myself, or with Irene Limbo, FNP-C. She was informed of the importance of frequent follow-up visits to maximize her success with intensive lifestyle modifications for her multiple health conditions.   Objective:   Blood pressure 111/64, pulse 74, temperature (!) 97.5 F (36.4 C), height 5\' 6"  (1.676 m), weight 259 lb (117.5 kg), SpO2 96 %. Body mass index is 41.8 kg/m.  General: Cooperative, alert, well developed, in no acute distress. HEENT: Conjunctivae and lids unremarkable. Cardiovascular: Regular rhythm.  Lungs: Normal work of breathing. Neurologic: No focal deficits.   Lab Results  Component Value Date   CREATININE 0.75 10/28/2021   BUN 13 10/28/2021   NA 141 10/28/2021   K 4.2 10/28/2021   CL 108 10/28/2021   CO2 27 10/28/2021   Lab Results  Component Value Date   ALT 27 07/20/2021   AST 17 07/20/2021   ALKPHOS 76 07/20/2021   BILITOT 0.4 07/20/2021   Lab Results  Component Value Date   HGBA1C 5.6 10/28/2021   HGBA1C 5.8 (H) 07/28/2021   HGBA1C 6.0 (A) 04/22/2021   HGBA1C 6.2 10/26/2020   HGBA1C 5.8 (H) 03/01/2020   Lab Results  Component Value Date   INSULIN 25.6 (H) 07/28/2021   INSULIN 21.8 09/20/2016   Lab Results  Component Value Date   TSH 0.36 07/20/2021   Lab Results  Component Value Date   CHOL  187 07/28/2021   HDL 64 07/28/2021   LDLCALC 102 (H) 07/28/2021   LDLDIRECT 117.0 02/26/2019   TRIG 122 07/28/2021   CHOLHDL 3 06/28/2020   Lab Results  Component Value Date   VD25OH 53.4 07/28/2021   VD25OH 41.4 09/20/2016   Lab Results  Component Value Date   WBC 5.1 07/28/2021   HGB 14.3 07/28/2021   HCT 45.0 07/28/2021   MCV 76 (L) 07/28/2021   PLT 255 07/28/2021   Lab Results  Component Value Date   IRON  71 06/23/2020   TIBC 294 06/23/2020   FERRITIN 94.6 06/23/2020   Attestation Statements:   Reviewed by clinician on day of visit: allergies, medications, problem list, medical history, surgical history, family history, social history, and previous encounter notes.   Trude Mcburney, am acting as Energy manager for [employer], DO.  I have reviewed the above documentation for accuracy and completeness, and I agree with the above. Corinna Capra, DO

## 2021-12-07 ENCOUNTER — Encounter (INDEPENDENT_AMBULATORY_CARE_PROVIDER_SITE_OTHER): Payer: Self-pay | Admitting: Bariatrics

## 2021-12-08 ENCOUNTER — Other Ambulatory Visit: Payer: Self-pay | Admitting: Hematology and Oncology

## 2021-12-08 DIAGNOSIS — Z1231 Encounter for screening mammogram for malignant neoplasm of breast: Secondary | ICD-10-CM

## 2021-12-12 ENCOUNTER — Telehealth: Payer: Self-pay | Admitting: Family

## 2021-12-12 NOTE — Telephone Encounter (Signed)
Michelle Kennedy, can you please place an order for CPAP mask and tubing and notify Darlina Guys that you placed the order ([place])? tks

## 2021-12-13 ENCOUNTER — Other Ambulatory Visit: Payer: Self-pay

## 2021-12-13 ENCOUNTER — Telehealth: Payer: Self-pay | Admitting: Pulmonary Disease

## 2021-12-13 DIAGNOSIS — G4733 Obstructive sleep apnea (adult) (pediatric): Secondary | ICD-10-CM

## 2021-12-13 NOTE — Telephone Encounter (Signed)
I called and spoke with Michelle Kennedy and she would need an OV within the last 30 days before [employer] could process her order. I left a brief message per DPR that she would need an updated OV to get more supplies. She was asked to call back with any questions.

## 2021-12-13 NOTE — Telephone Encounter (Signed)
Order placed and message sent

## 2021-12-18 ENCOUNTER — Other Ambulatory Visit: Payer: Self-pay | Admitting: Family

## 2021-12-20 ENCOUNTER — Encounter (INDEPENDENT_AMBULATORY_CARE_PROVIDER_SITE_OTHER): Payer: Self-pay | Admitting: Bariatrics

## 2021-12-20 ENCOUNTER — Encounter: Payer: Self-pay | Admitting: Family

## 2021-12-20 DIAGNOSIS — L989 Disorder of the skin and subcutaneous tissue, unspecified: Secondary | ICD-10-CM

## 2021-12-21 ENCOUNTER — Telehealth: Payer: Self-pay | Admitting: Hematology and Oncology

## 2021-12-21 ENCOUNTER — Ambulatory Visit
Admission: RE | Admit: 2021-12-21 | Discharge: 2021-12-21 | Disposition: A | Discharge status: Home / Self Care | Payer: Medicare Other | Source: Ambulatory Visit | Attending: Hematology and Oncology | Admitting: Hematology and Oncology

## 2021-12-21 ENCOUNTER — Other Ambulatory Visit: Payer: Medicare Other

## 2021-12-21 DIAGNOSIS — Z1231 Encounter for screening mammogram for malignant neoplasm of breast: Secondary | ICD-10-CM

## 2021-12-21 NOTE — Telephone Encounter (Signed)
Rescheduled appointment per provider PAL. Left voicemail. 

## 2021-12-22 NOTE — Addendum Note (Signed)
Addended by: Debbrah Alar on: 12/22/2021 10:39 AM   Modules accepted: Orders

## 2021-12-28 ENCOUNTER — Encounter (INDEPENDENT_AMBULATORY_CARE_PROVIDER_SITE_OTHER): Payer: Self-pay

## 2021-12-29 ENCOUNTER — Encounter: Payer: Self-pay | Admitting: Family

## 2021-12-29 MED ORDER — EPINEPHRINE 0.3 MG/0.3ML IJ SOAJ
0.3000 mg | INTRAMUSCULAR | 1 refills | Status: DC | PRN
Start: 2021-12-29 — End: 2023-07-20

## 2021-12-29 MED ORDER — ALBUTEROL SULFATE HFA 108 (90 BASE) MCG/ACT IN AERS: INHALATION_SPRAY | RESPIRATORY_TRACT | 1 refills | Status: DC

## 2022-01-01 ENCOUNTER — Other Ambulatory Visit: Payer: Self-pay | Admitting: Family

## 2022-01-04 ENCOUNTER — Ambulatory Visit (INDEPENDENT_AMBULATORY_CARE_PROVIDER_SITE_OTHER): Payer: Medicare Other | Admitting: Nurse Practitioner

## 2022-01-04 VITALS — BP 109/74 | HR 79 | Temp 97.7°F | Ht 66.0 in | Wt 257.0 lb

## 2022-01-04 DIAGNOSIS — R7303 Prediabetes: Secondary | ICD-10-CM

## 2022-01-04 DIAGNOSIS — Z6841 Body Mass Index (BMI) 40.0 and over, adult: Secondary | ICD-10-CM | POA: Diagnosis not present

## 2022-01-04 DIAGNOSIS — E669 Obesity, unspecified: Secondary | ICD-10-CM | POA: Diagnosis not present

## 2022-01-04 MED ORDER — SEMAGLUTIDE(0.25 OR 0.5MG/DOS) 2 MG/3ML ~~LOC~~ SOPN: PEN_INJECTOR | SUBCUTANEOUS | 0 refills | Status: DC

## 2022-01-05 ENCOUNTER — Inpatient Hospital Stay: Payer: Medicare Other | Attending: Hematology and Oncology

## 2022-01-05 ENCOUNTER — Telehealth: Payer: Self-pay | Admitting: Radiology

## 2022-01-05 ENCOUNTER — Encounter: Payer: Self-pay | Admitting: Radiology

## 2022-01-05 ENCOUNTER — Other Ambulatory Visit: Payer: Self-pay

## 2022-01-05 ENCOUNTER — Other Ambulatory Visit: Payer: Self-pay | Admitting: *Deleted

## 2022-01-05 DIAGNOSIS — C50412 Malignant neoplasm of upper-outer quadrant of left female breast: Secondary | ICD-10-CM

## 2022-01-05 LAB — RESEARCH LABS

## 2022-01-05 NOTE — Telephone Encounter (Signed)
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 Requested appt.   Carol Ada, RT(R)(T) Clinical Research Coordinator

## 2022-01-05 NOTE — Telephone Encounter (Signed)
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 Patient had lab visit and MD visit scheduled for the above mentioned study on 01/13/22, but this was canceled.

## 2022-01-05 NOTE — Research (Signed)
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 Patient is doing well, confirmed she is still taking Anastrozole daily with no change to this. Patient states she has not had COVID in the past year. Patient had a mammogram on 12/21/2021 with no concerns found. Patient arrived to complete the 7 year blood collection for the above mentioned study. Labs were collected per protocol via fresh venipuncture. Patient tolerated procedure well. Thanked patient for her time and participation in the above mentioned study. Informed patient next visit will be a phone call in one year.  Carol Ada, RT(R)(T) Clinical Research Coordinator

## 2022-01-10 ENCOUNTER — Telehealth: Payer: Self-pay | Admitting: Orthopaedic Surgery

## 2022-01-10 NOTE — Telephone Encounter (Signed)
Sent mychart message to patient concerning gel injection.  Please see previous message.

## 2022-01-10 NOTE — Telephone Encounter (Signed)
Patient put in a MyChart request for Orthovisc Injection for the right knee  after 02/23/22

## 2022-01-12 NOTE — Progress Notes (Signed)
Chief Complaint:   OBESITY Michelle Kennedy is here to discuss her progress with her obesity treatment plan along with follow-up of her obesity related diagnoses. Michelle Kennedy is on practicing portion control and making smarter food choices, such as increasing vegetables and decreasing simple carbohydrates and states she is following her eating plan approximately 75% of the time. Michelle Kennedy states she is lifting weight/aerobics 30 minutes 7 times per week.  Today's visit was #: 8 Starting weight: 275 lbs Starting date: 07/28/2021 Today's weight: 257 lbs Today's date: 01/04/2022 Total lbs lost to date: 18 lbs Total lbs lost since last in-office visit: 2  Interim History: Michelle Kennedy has overall done well with weight loss. She is doing well on Cat 4 plan and is not skipping meals. Taking Ozempic 0.25 mg weekly. She finds that she is hungry and cravings several day after taking Ozempic. She is going to [place] in October. Overall feels better and feels better with walking and exercising.  Subjective:   1. Pre-diabetes Michelle Kennedy is currently taking Ozempic 0.25 mg x 4 weeks and Metformin 500 mg twice a day. The first 2 weeks noted some nausea but that has subsided over the past 2 weeks. Never took Zofran but has it if needed. She also reports side effects of constipation but feels it is due to cheese. She only feels "shaky" if not eating or eating late.  Assessment/Plan:   1. Pre-diabetes INCREASE Ozempic to 0.5 mg SubQ weekly with 0 refills.  Decrease Metformin to once a day.  Michelle Kennedy will continue to work on weight loss, exercise, and decreasing simple carbohydrates to help decrease the risk of diabetes.    -Increase Semaglutide,0.25 or 0.'5MG'$ /DOS, 2 MG/3ML SOPN; Inject 0.5 mg into the skin weekly  Dispense: 3 mL; Refill: 0  2. Obesity, current BMI 41.6 Michelle Kennedy is currently in the action stage of change. As such, her goal is to continue with weight loss efforts. She has agreed to the Category 4 Plan.    Scheduled for labs with PCP in Sept.  Exercise goals: As is.  Behavioral modification strategies: increasing lean protein intake, increasing water intake, and planning for success.  Michelle Kennedy has agreed to follow-up with our clinic in 4 weeks. She was informed of the importance of frequent follow-up visits to maximize her success with intensive lifestyle modifications for her multiple health conditions.   Objective:   Blood pressure 109/74, pulse 79, temperature 97.7 F (36.5 C), height '5\' 6"'$  (1.676 m), weight 257 lb (116.6 kg), SpO2 95 %. Body mass index is 41.48 kg/m.  General: Cooperative, alert, well developed, in no acute distress. HEENT: Conjunctivae and lids unremarkable. Cardiovascular: Regular rhythm.  Lungs: Normal work of breathing. Neurologic: No focal deficits.   Lab Results  Component Value Date   CREATININE 0.75 10/28/2021   BUN 13 10/28/2021   NA 141 10/28/2021   K 4.2 10/28/2021   CL 108 10/28/2021   CO2 27 10/28/2021   Lab Results  Component Value Date   ALT 27 07/20/2021   AST 17 07/20/2021   ALKPHOS 76 07/20/2021   BILITOT 0.4 07/20/2021   Lab Results  Component Value Date   HGBA1C 5.6 10/28/2021   HGBA1C 5.8 (H) 07/28/2021   HGBA1C 6.0 (A) 04/22/2021   HGBA1C 6.2 10/26/2020   HGBA1C 5.8 (H) 03/01/2020   Lab Results  Component Value Date   INSULIN 25.6 (H) 07/28/2021   INSULIN 21.8 09/20/2016   Lab Results  Component Value Date   TSH 0.36 07/20/2021  Lab Results  Component Value Date   CHOL 187 07/28/2021   HDL 64 07/28/2021   LDLCALC 102 (H) 07/28/2021   LDLDIRECT 117.0 02/26/2019   TRIG 122 07/28/2021   CHOLHDL 3 06/28/2020   Lab Results  Component Value Date   VD25OH 53.4 07/28/2021   VD25OH 41.4 09/20/2016   Lab Results  Component Value Date   WBC 5.1 07/28/2021   HGB 14.3 07/28/2021   HCT 45.0 07/28/2021   MCV 76 (L) 07/28/2021   PLT 255 07/28/2021   Lab Results  Component Value Date   IRON 71 06/23/2020   TIBC  294 06/23/2020   FERRITIN 94.6 06/23/2020   Attestation Statements:   Reviewed by clinician on day of visit: allergies, medications, problem list, medical history, surgical history, family history, social history, and previous encounter notes.  I, Brendell Tyus, RMA, am acting as transcriptionist for Everardo Pacific, FNP.  I have reviewed the above documentation for accuracy and completeness, and I agree with the above. Everardo Pacific, FNP

## 2022-01-13 ENCOUNTER — Other Ambulatory Visit: Payer: Medicare Other

## 2022-01-13 ENCOUNTER — Ambulatory Visit: Payer: 59 | Admitting: Hematology and Oncology

## 2022-01-18 ENCOUNTER — Other Ambulatory Visit: Payer: Medicare Other

## 2022-01-18 ENCOUNTER — Ambulatory Visit: Payer: Self-pay | Admitting: Hematology and Oncology

## 2022-02-01 ENCOUNTER — Ambulatory Visit (INDEPENDENT_AMBULATORY_CARE_PROVIDER_SITE_OTHER): Payer: Medicare Other | Admitting: Bariatrics

## 2022-02-01 ENCOUNTER — Encounter: Payer: Self-pay | Admitting: Bariatrics

## 2022-02-01 VITALS — BP 115/75 | HR 71 | Temp 97.9°F | Ht 66.0 in | Wt 253.0 lb

## 2022-02-01 DIAGNOSIS — R7303 Prediabetes: Secondary | ICD-10-CM

## 2022-02-01 DIAGNOSIS — E66813 Obesity, class 3: Secondary | ICD-10-CM

## 2022-02-01 DIAGNOSIS — E668 Other obesity: Secondary | ICD-10-CM

## 2022-02-01 DIAGNOSIS — E65 Localized adiposity: Secondary | ICD-10-CM | POA: Insufficient documentation

## 2022-02-01 DIAGNOSIS — Z6841 Body Mass Index (BMI) 40.0 and over, adult: Secondary | ICD-10-CM | POA: Diagnosis not present

## 2022-02-01 MED ORDER — SEMAGLUTIDE(0.25 OR 0.5MG/DOS) 2 MG/3ML ~~LOC~~ SOPN: PEN_INJECTOR | SUBCUTANEOUS | 0 refills | Status: DC

## 2022-02-02 ENCOUNTER — Ambulatory Visit (INDEPENDENT_AMBULATORY_CARE_PROVIDER_SITE_OTHER): Payer: Medicare Other | Admitting: Internal Medicine

## 2022-02-02 ENCOUNTER — Encounter: Payer: Self-pay | Admitting: Internal Medicine

## 2022-02-02 VITALS — BP 128/78 | HR 71 | Ht 66.0 in | Wt 255.6 lb

## 2022-02-02 DIAGNOSIS — C73 Malignant neoplasm of thyroid gland: Secondary | ICD-10-CM

## 2022-02-02 DIAGNOSIS — R7303 Prediabetes: Secondary | ICD-10-CM | POA: Diagnosis not present

## 2022-02-02 DIAGNOSIS — E890 Postprocedural hypothyroidism: Secondary | ICD-10-CM

## 2022-02-02 LAB — POCT GLYCOSYLATED HEMOGLOBIN (HGB A1C): Hemoglobin A1C: 5.7 % — AB (ref 4.0–5.6)

## 2022-02-02 LAB — T4, FREE: Free T4: 1.35 ng/dL (ref 0.60–1.60)

## 2022-02-02 LAB — TSH: TSH: 0.05 u[IU]/mL — ABNORMAL LOW (ref 0.35–5.50)

## 2022-02-02 NOTE — Patient Instructions (Addendum)
Please continue Metformin 500 mg daily until you increase the Ozempic dose.  Also, continue: - Levothyroxine 150 mcg daily.  Take the thyroid hormone every day, with water, at least 30 minutes before breakfast, separated by at least 4 hours from: - acid reflux medications - calcium - iron - multivitamins  Please return in 6 months.

## 2022-02-02 NOTE — Progress Notes (Unsigned)
Patient ID: [name], female   DOB: 05-Jul-1955, 66 y.o.   MRN: [id number]   HPI  Starlette Thurow is a 66 y.o.-year-old female, returning for follow-up for h/o papillary thyroid cancer, postsurgical hypothyroidism, prediabetes. She saw Dr. Iran Planas before. Last visit with me 9 months ago.  Interim history: No  blurry vision, nausea, chest pain.   She has increased urination. She gained ~40s in year before last visit.  She lost 31 pounds since then! She started Ozempic 0.5 mg 2 mo ago.  Her metformin dose was reduced at that time. She is preparing to go to [place] next month.    Thyroid cancer: Reviewed history: Pt. has been dx with ThyCA in 2002, and had total thyroidectomy then [facility]). No RAI tx 2/2 severe iodine allergy reportedly, no postop U/S's checked.  Neck U/S (12/2016): Complete thyroidectomy without significant abnormality by ultrasound  Thyroglobulin levels are low, but detectable:  Lab Results  Component Value Date   THYROGLB 0.1 (L) 03/15/2021   THYROGLB 0.1 (L) 12/09/2019   THYROGLB 0.3 (L) 07/15/2019   THYROGLB 0.1 (L) 09/17/2017   THYROGLB 0.1 (L) 12/12/2016   Lab Results  Component Value Date   THGAB <1 03/15/2021   THGAB <1 12/09/2019   THGAB <1 07/15/2019   THGAB <1 09/17/2017   THGAB <1 12/12/2016  10/11/2015: Tg <0.1, ATA <1  Pt denies: - feeling nodules in neck - hoarseness - dysphagia - choking  Postsurgical hypothyroidism: She was previously on levothyroxine 200 mcg daily >> decreased to 175 mcg daily >> in 02/2021, I advised her to decrease the dose to 150 mcg daily, but she forgot.  I advised her to decrease the dose in 02/2021.  She takes the levothyroxine: - in am - fasting - at least 30 min from b'fast -+ Calcium at night - 1-2 Tums at bedtime 2x a week - + MVI at night - no PPIs at night - not on Biotin - on vitamin D   Reviewed patient's TFTs: Lab Results  Component Value Date   TSH 0.36 07/20/2021   TSH  0.31 (L) 03/15/2021   TSH 0.65 03/01/2020   TSH 0.41 12/09/2019   TSH 0.20 (L) 07/15/2019   TSH 2.01 07/05/2018   TSH 6.40 (H) 05/30/2018   TSH 14.60 (H) 05/01/2018   TSH 0.82 09/17/2017   TSH 2.91 03/20/2017   FREET4 1.13 07/20/2021   FREET4 1.18 03/15/2021   FREET4 1.13 12/09/2019   FREET4 1.14 07/15/2019   FREET4 1.12 09/17/2017   FREET4 0.87 03/20/2017   FREET4 0.90 12/12/2016   FREET4 1.16 09/20/2016   Prev. Very suppressed TSH levels per review of Care Everywhere records.  She has + FH of thyroid disorders in: siblings: goiter. + FH of thyroid cancer: Paternal aunts and uncle. No history of radiation therapy to head or neck . Had RxTx for BrCA (diagnosed in 2016)-but not to the neck.  Prediabetes:  Reviewed HbA1c: Lab Results  Component Value Date   HGBA1C 5.6 10/28/2021   HGBA1C 5.8 (H) 07/28/2021   HGBA1C 6.0 (A) 04/22/2021   HGBA1C 6.2 10/26/2020   HGBA1C 5.8 (H) 03/01/2020   HGBA1C 6.1 (H) 08/23/2019   HGBA1C 5.9 02/26/2018   HGBA1C 5.7 (H) 01/23/2017   HGBA1C 5.6 09/20/2016   HGBA1C 5.7 (H) 04/10/2016   On Metformin 500 mg 2x a day >> lightheadedness >> once a day.  Pt checks her sugars 0-1x a day: - am: 60s, 70s-84 >> 100-130 - 2h  after b'fast: up to 180s >> n/c - before lunch: n/c >> 115-134 - 2h after lunch: n/c - before dinner: n/c >> 113-140 - 2h after dinner: up to 210 >> n/c - bedtime: n/c - nighttime: n/c Lowest sugar was 60s; she has hypoglycemia awareness at 70.  Highest sugar was 200s.  Glucometer: Auvon  Pt's meals are: - Breakfast:  oatmeal + berries; egg mcmuffin + sausage + cheese; scrambled eggs + sausage + potatoes + peppers - Lunch: tomato soup, mac and cheese - Dinner: same  - Snacks: almonds, berry pie No sweet drinks. She saw a dietitian in the past.  - no CKD, last BUN/creatinine:  Lab Results  Component Value Date   BUN 13 10/28/2021   BUN 14 04/27/2021   CREATININE 0.75 10/28/2021   CREATININE 0.78 04/27/2021  On  lisinopril 20 mg daily.  - + HL; last set of lipids: Lab Results  Component Value Date   CHOL 187 07/28/2021   HDL 64 07/28/2021   LDLCALC 102 (H) 07/28/2021   LDLDIRECT 117.0 02/26/2019   TRIG 122 07/28/2021   CHOLHDL 3 06/28/2020  On atorvastatin 40 mg daily.  - last eye exam was on 02/08/2021: No DR, + open-angle glaucoma, mild cataract right eye, + astigmatism.  - + numbness and tingling in her feet.  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  She is in a study for chemotherapy-induced neuropathy.  She is on Neurontin 200 mg 3 times a day.  Pt has FH of DM in M, B, PGM.  She was seeing Dr. Dennard Nip in the [facility] >> stopped She has fibromyalgia, RA, HTN, asthma.  She is on anastrozole for h/o breast cancer.  ROS: + See HPI  I reviewed pt's medications, allergies, PMH, social hx, family hx, and changes were documented in the history of present illness. Otherwise, unchanged from my initial visit note.  Past Medical History:  Diagnosis Date   Abnormally small mouth    Achilles tendon disorder, right    Anemia    Anxiety    Arthritis    hips and knees   Asthma    daily inhaler, prn inhaler and neb.   Asthma due to environmental allergies    Breast cancer ([place]) 01/2014   left   Chest pain    CHF (congestive heart failure) (HCC)    COPD (chronic obstructive pulmonary disease) (HCC)    Dental bridge present    upper front and lower right   Dental crowns present    x 3   Depression    Esophageal spasm    reports since her chemo and breast surgery  she developed esophageal spasms and reports this is in the past has casue her 02 to desat in the  70s , denies sycnope in relation to this , does report hx of vertigo as well    Family history of anesthesia complication    twin brother aspirated and died on OR table, per pt.   Fibromyalgia    Gallbladder disease    Gallstones    GERD (gastroesophageal reflux disease)    Glaucoma     H/O blood clots    had blood to  PICC line    History of gastric ulcer    as a teenager   History of seizure age 63   as a reaction to Penicillin - no seizures since   History of stomach ulcers    History of thyroid cancer  s/p thyroidectomy   HTN (hypertension)    Hyperlipidemia    Hypothyroidism    Joint pain    Left knee injury    Liver disorder    seen when she had her hysterectomy , reports she was told it was  " small lesion" ; but asymptomatic    Lymphedema    left arm   Migraines    Multiple food allergies    Obesity    OSA (obstructive sleep apnea) 02/16/2016   CPAP   Palpitations    reports no longer experiences   Personal history of chemotherapy 2015   Personal history of radiation therapy 2015   Left   Prediabetes    Rheumatoid arthritis (HCC)    SOB (shortness of breath)    Swelling of both ankles    Tinnitus    UTI (lower urinary tract infection) 02/17/2014   Vertigo    last episode  was 2 weeks ago    Vitamin D deficiency    Wears contact lenses    left eye only    Wears hearing aid in both ears    Past Surgical History:  Procedure Laterality Date   ABDOMINAL HYSTERECTOMY  2004   complete   ACHILLES TENDON REPAIR Right    APPENDECTOMY  2004   BREAST EXCISIONAL BIOPSY     BREAST LUMPECTOMY Left    2015   BREAST LUMPECTOMY WITH NEEDLE LOCALIZATION AND AXILLARY SENTINEL LYMPH NODE BX Left 02/23/2014   Procedure: BREAST LUMPECTOMY WITH NEEDLE LOCALIZATION AND AXILLARY SENTINEL LYMPH NODE BIOPSY;  Surgeon: Excell Seltzer, MD;  Location: [facility];  Service: General;  Laterality: Left;   BREAST SURGERY  2011   left breast biposy   CHOLECYSTECTOMY  1990   COLONOSCOPY W/ POLYPECTOMY  06/2009   EYE SURGERY Right 2013   exc. warts from underneath eyelid   INCONTINENCE SURGERY  2004   KNEE ARTHROSCOPY Bilateral    x 6 each knee   LIGAMENT REPAIR Right    thumb/wrist   ORIF TOE FRACTURE Right    great toe   PORTACATH PLACEMENT N/A  03/24/2014   Procedure: INSERTION PORT-A-CATH;  Surgeon: Excell Seltzer, MD;  Location: [facility];  Service: General;  Laterality: N/A;; removed    THYROIDECTOMY  2000   TONSILLECTOMY AND ADENOIDECTOMY  2000   TOTAL KNEE ARTHROPLASTY Left 12/03/2017   Procedure: LEFT TOTAL KNEE ARTHROPLASTY;  Surgeon: Gaynelle Arabian, MD;  Location: [facility];  Service: Orthopedics;  Laterality: Left;   TUMOR EXCISION     from thoracic spine   Social History   Social History   Marital status: Married    Spouse name: N/A   Number of children: 2   Occupational History   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 40 mL 3   albuterol (VENTOLIN HFA) 108 (90 Base) MCG/ACT inhaler USE 2 INHALATIONS ORALLY   EVERY 6 HOURS AS NEEDED FORWHEEZING OR SHORTNESS OF   BREATH. 54 g 1   amLODipine (NORVASC) 5 MG tablet Take 1 tablet  by mouth once daily 90 tablet 0   anastrozole (ARIMIDEX) 1 MG tablet Take 1 tablet (1 mg total) by mouth daily. 90 tablet 3   CALCIUM PO Take 1 tablet by mouth every evening.     cholecalciferol (VITAMIN D3) 25 MCG (1000 UNIT) tablet Take 1,000 Units by mouth every evening.      clopidogrel (PLAVIX) 75 MG tablet Take 1 tablet (75 mg total) by mouth daily. 90 tablet 1   EPINEPHrine 0.3 mg/0.3 mL IJ SOAJ injection Inject 0.3 mg into the muscle as needed for anaphylaxis. 2 each 1   furosemide (LASIX) 20 MG tablet TAKE 1 TABLET(20 MG) BY MOUTH DAILY AS NEEDED FOR SWELLING 30 tablet 3   gabapentin (NEURONTIN) 100 MG capsule Take 2 capsules (200 mg total) by mouth 3  (three) times daily. 540 capsule 1   GEMTESA 75 MG TABS Take 1 tablet by mouth daily.     latanoprost (XALATAN) 0.005 % ophthalmic solution 1 drop at bedtime.     levothyroxine (SYNTHROID) 200 MCG tablet Take 1 tablet (200 mcg total) by mouth daily. 90 tablet 3   lisinopril (ZESTRIL) 20 MG tablet TAKE 2 TABLETS (40 MG TOTAL) BY MOUTH DAILY. 180 tablet 0   meclizine (ANTIVERT) 25 MG tablet Take 1 tablet (25 mg total) by mouth 3 (three) times daily as needed for dizziness. 30 tablet 0   metFORMIN (GLUCOPHAGE) 500 MG tablet Take 1 tablet (500 mg total) by mouth 2 (two) times daily with a meal. 180 tablet 2   methocarbamol (ROBAXIN) 500 MG tablet Take 1 tablet (500 mg total) by mouth 3 (three) times daily. 90 tablet 0   Multiple Vitamin (MULTIVITAMIN ADULT) TABS Take 1 tablet by mouth daily.     Multiple Vitamins-Minerals (OCUVITE PO) Take 1 tablet by mouth daily.     Omega-3 Fatty Acids (FISH OIL) 1000 MG CAPS Take 1 capsule by mouth every evening.      ondansetron (ZOFRAN) 4 MG tablet Take 1 tablet (4 mg total) by mouth every 8 (eight) hours as needed for nausea or vomiting. 20 tablet 0   pantoprazole (PROTONIX) 40 MG tablet TAKE 1 TABLET EVERY DAY 90 tablet 1   rosuvastatin (CRESTOR) 5 MG tablet Take 1 tablet (5 mg total) by mouth daily. 90 tablet 3   Semaglutide,0.25 or 0.5MG/DOS, 2 MG/3ML SOPN Inject 0.5 mg into the skin weekly 9 mL 0   traMADol (ULTRAM) 50 MG tablet Take 1 tablet (50 mg total) by mouth every 8 (eight) hours as needed for moderate pain or severe pain. 21 tablet 0   WIXELA INHUB 500-50 MCG/ACT AEPB INHALE 1 PUFF INTO THE LUNGS TWICE DAILY 60 each 6   No current facility-administered medications on file prior to visit.   Allergies  Allergen Reactions   Bee Venom Anaphylaxis   Codeine Shortness Of Breath and Rash   Contrast Media [Iodinated Contrast Media] Shortness Of Breath   Gadolinium Derivatives Hives, Rash, Shortness Of Breath and Swelling    "my heart stopped  beating"    Iodine Other (See Comments)    CARDIAC ARREST   Latex Anaphylaxis and Rash   Lidocaine Shortness Of Breath and Swelling    SWELLING OF MOUTH AND THROAT, low BP   Penicillins Shortness Of Breath, Rash and Other (See Comments)    SEIZURE Did it involve swelling of the face/tongue/throat, SOB, or low BP? no Did it involve sudden or severe rash/hives, skin peeling, or any reaction on the inside of your mouth  or nose? yes Did you need to seek medical attention at a hospital or doctor's office? yes When did it last happen?      2010 If all above answers are "NO", may proceed with cephalosporin use.    Pentazocine Lactate Shortness Of Breath and Rash   Shellfish Allergy Shortness Of Breath and Rash   Aspirin Rash and Other (See Comments)    GI UPSET   Erythromycin Swelling and Rash    SWELLING OF JOINTS   Symbicort [Budesonide-Formoterol Fumarate] Other (See Comments)    BURNING OF TONGUE AND LIPS   Betadine [Povidone Iodine]     Rash. Breathing problems.    Compazine [Prochlorperazine Maleate] Rash    Rash on face,chest, arms, back   Sulfonamide Derivatives Rash   Family History  Problem Relation Age of Onset   Alcohol abuse Mother    Arthritis Mother    Hypertension Mother    Bipolar disorder Mother    Breast cancer Mother 52       unconfirmed   Lung cancer Mother 92       smoker   Hyperlipidemia Mother    Stroke Mother    Cancer Mother    Depression Mother    Anxiety disorder Mother    Alcoholism Mother    Drug abuse Mother    Eating disorder Mother    Obesity Mother    Alcohol abuse Father    Hyperlipidemia Father    Kidney disease Father    Diabetes Father    Hypertension Father    Cystic kidney disease Father    Thyroid disease Father    Liver disease Father    Alcoholism Father    Lung cancer Father    Thyroid cancer Sister 61       type?; currently 79   Other Sister        ovarian tumor @ 28; TAH/BSO   Breast cancer Sister    Thyroid cancer  Brother        dx 74s; currently 8   Breast cancer Maternal Aunt        dx 29s; deceased 24   Thyroid cancer Paternal 33        All 3 paternal aunts with thyroid ca in 30s/40s   Lung cancer Paternal Aunt        2 of 3 paternal aunts with lung cancer   Arthritis Maternal Grandmother    Diabetes Paternal Grandmother    Heart disease Other    COPD Other    Asthma Other     PE: BP 128/78 (BP Location: Right Arm, Patient Position: Sitting, Cuff Size: Normal)   Pulse 71   Ht _0  (1.676 m)   Wt 255 lb 9.6 oz (115.9 kg)   SpO2 98%   BMI 41.25 kg/m  Wt Readings from Last 3 Encounters:  02/02/22 255 lb 9.6 oz (115.9 kg)  02/01/22 253 lb (114.8 kg)  01/04/22 257 lb (116.6 kg)   Constitutional: overweight, in NAD Eyes:  EOMI, no exophthalmos ENT: no neck masses, no cervical lymphadenopathy Cardiovascular: RRR, No MRG Respiratory: CTA B Musculoskeletal: no deformities Skin:no rashes Neurological: + slight tremor with outstretched L hand Diabetic Foot Exam - Simple   Simple Foot Form Visual Inspection No deformities, no ulcerations, no other skin breakdown bilaterally: Yes Sensation Testing Intact to touch and monofilament testing bilaterally: Yes Pulse Check Posterior Tibialis and Dorsalis pulse intact bilaterally: Yes Comments + mild pitting edema B    ASSESSMENT: 1. H/O  PTC  2. Hypothyroidism  3.  Prediabetes - R eye stroke 2021  She also has dCHF but unlikely as a complication of her prediabetes.  PLAN:  1. PTC -No detailed records; did not have RAI treatment because of allergy to iodine -Previous thyroglobulin levels were undetectable as were her ATA antibodies per records reviewed from Dr. Tamala Julian.  Thyroglobulin increased afterwards with a level of 0.3 in 06/2019.  We discussed the it is not completely uncommon to have an occasional slightly higher globulin due to assay variability and due to the fact that she did not have RAI treatment.  We repeated her  thyroglobulin in 11/2019 and this decreased to 0.1.  Also, in 02/2021, Tg was stable, at 0.1 with undetectable antithyroglobulin antibodies -we reviewed her latest neck ultrasound report from 2018: No metastases or recurrence in the neck -We will recheck a neck ultrasound now -We will recheck thyroglobulin and ATA antibodies   2. Patient with long standing, postsurgical hypothyroidism, on levothyroxine - latest thyroid labs reviewed with pt. >> normal: Lab Results  Component Value Date   TSH 0.36 07/20/2021  - she continues on LT4 200 mcg daily, decreased at last visit - pt feels good on this dose. - we discussed about taking the thyroid hormone every day, with water, >30 minutes before breakfast, separated by >4 hours from acid reflux medications, calcium, iron, multivitamins. Pt. is taking it correctly. - will check thyroid tests today: TSH and fT4 - If labs are abnormal, she will need to return for repeat TFTs in 1.5 months  3.  Prediabetes -She has longstanding, grade diabetes.  At last visit, HbA1c was lower, at 6.0%.  She was very worried about this and was interested in starting medications to reduce her insulin resistance. -I advised her to reduce fat in her diet to improve her insulin resistance -We also started metformin 500 mg once a day and advised her to increase to twice a day -At the visit, sugars are fluctuating between 60s occasionally to 200s after dinner -At today's visit, she was started on Ozempic by her weight management specialist.  Currently on 0.5 mg weekly.  The dose of metformin was decreased on Ozempic, currently only on 500 mg daily. -Sugars at home are mostly at goal.  I advised her to stop the metformin when she increases the dose of Ozempic. - we checked her HbA1c: 5.7% (lower) - advised to check sugars at different times of the day - 1x a day, rotating check times - advised for yearly eye exams >> she is UTD - return to clinic in 3-4 months Patient  Instructions  Please continue Metformin 500 mg daily until you increase the Ozempic dose.  Also, continue: - Levothyroxine 150 mcg daily.  Take the thyroid hormone every day, with water, at least 30 minutes before breakfast, separated by at least 4 hours from: - acid reflux medications - calcium - iron - multivitamins  Please return in 6 months.  Component     Latest Ref Rng 02/02/2022  TSH     0.35 - 5.50 uIU/mL 0.05 (L)   T4,Free(Direct)     0.60 - 1.60 ng/dL 1.35   Thyroglobulin Ab     < or = 1 IU/mL <1   Thyroglobulin     ng/mL 0.1 (L)   Comment --   Hemoglobin A1C     4.0 - 5.6 % 5.7 !     Thyroglobulin is stable, but the TSH is quite suppressed.   Reviewing  her medication list, it appears that she is still on the 200 mcg dose, which was sent to her pharmacy in 09/2021!  I will check with her.  If this is correct, we will need to back off her levothyroxine to 150 mcg daily and recheck her tests in 1.5 months.  Neck U/S (02/03/2022): Surgical changes of total thyroidectomy without evidence of residual or recurrent thyroid tissue, nodularity or lymphadenopathy.  Philemon Kingdom, MD PhD [facility] Endocr[facility]

## 2022-02-03 ENCOUNTER — Ambulatory Visit
Admission: RE | Admit: 2022-02-03 | Discharge: 2022-02-03 | Disposition: A | Discharge status: Home / Self Care | Payer: Medicare Other | Source: Ambulatory Visit | Attending: Internal Medicine | Admitting: Internal Medicine

## 2022-02-03 DIAGNOSIS — C73 Malignant neoplasm of thyroid gland: Secondary | ICD-10-CM

## 2022-02-03 LAB — THYROGLOBULIN LEVEL: Thyroglobulin: 0.1 ng/mL — ABNORMAL LOW

## 2022-02-03 LAB — THYROGLOBULIN ANTIBODY: Thyroglobulin Ab: <1 IU/mL (ref <=1)

## 2022-02-03 MED ORDER — LEVOTHYROXINE SODIUM 150 MCG PO TABS: 150.0000 ug | ORAL_TABLET | Freq: Every day | ORAL | 3 refills | Status: DC

## 2022-02-05 ENCOUNTER — Other Ambulatory Visit: Payer: Self-pay | Admitting: Family

## 2022-02-06 ENCOUNTER — Ambulatory Visit (INDEPENDENT_AMBULATORY_CARE_PROVIDER_SITE_OTHER): Payer: Medicare Other | Admitting: Pulmonary Disease

## 2022-02-06 ENCOUNTER — Other Ambulatory Visit: Payer: Self-pay | Admitting: Pulmonary Disease

## 2022-02-06 ENCOUNTER — Encounter: Payer: Self-pay | Admitting: Pulmonary Disease

## 2022-02-06 VITALS — BP 114/70 | HR 75 | Temp 97.9°F | Ht 66.0 in | Wt 256.0 lb

## 2022-02-06 DIAGNOSIS — G4733 Obstructive sleep apnea (adult) (pediatric): Secondary | ICD-10-CM | POA: Diagnosis not present

## 2022-02-06 DIAGNOSIS — J4540 Moderate persistent asthma, uncomplicated: Secondary | ICD-10-CM | POA: Diagnosis not present

## 2022-02-06 DIAGNOSIS — Z9989 Dependence on other enabling machines and devices: Secondary | ICD-10-CM | POA: Diagnosis not present

## 2022-02-06 MED ORDER — FLUTICASONE-SALMETEROL 250-50 MCG/ACT IN AEPB: 1.0000 | INHALATION_SPRAY | Freq: Two times a day (BID) | RESPIRATORY_TRACT | 6 refills | Status: DC

## 2022-02-06 NOTE — Progress Notes (Signed)
Michelle Kennedy    [id number]    06/12/1955  Primary Care Physician:O'Sullivan, Lenna Sciara, NP  Referring Physician: Debbrah Alar, NP [address] [place],  [place] [address]  Chief complaint:   Patient being seen for obstructive sleep apnea  HPI:  Diagnosed with obstructive sleep apnea about 2019  Last sleep study was in 2022 showing mild obstructive sleep apnea with hypoventilation  Has recently lost about 40 pounds and continues to lose weight actively  No recent asthma exacerbation  She is feeling better, functioning well  She does sleep well at night Feels well during the day Usually goes to bed about 930, sleep onset about 30 minutes They wake up once Final wake up time about 7:30 AM  Usually does not feel sleepy in the mornings Will start feeling sleepy about 3 to 4:00 in the afternoon  She is compliant with CPAP use   Outpatient Encounter Medications as of 02/06/2022  Medication Sig   albuterol (PROVENTIL) (5 MG/ML) 0.5% nebulizer solution Take 0.5 mLs (2.5 mg total) by nebulization every 6 (six) hours as needed for wheezing or shortness of breath.   albuterol (VENTOLIN HFA) 108 (90 Base) MCG/ACT inhaler USE 2 INHALATIONS ORALLY   EVERY 6 HOURS AS NEEDED FORWHEEZING OR SHORTNESS OF   BREATH.   amLODipine (NORVASC) 5 MG tablet Take 1 tablet by mouth once daily   anastrozole (ARIMIDEX) 1 MG tablet Take 1 tablet (1 mg total) by mouth daily.   CALCIUM PO Take 1 tablet by mouth every evening.   cholecalciferol (VITAMIN D3) 25 MCG (1000 UNIT) tablet Take 1,000 Units by mouth every evening.    clopidogrel (PLAVIX) 75 MG tablet TAKE 1 TABLET EVERY DAY   EPINEPHrine 0.3 mg/0.3 mL IJ SOAJ injection Inject 0.3 mg into the muscle as needed for anaphylaxis.   furosemide (LASIX) 20 MG tablet TAKE 1 TABLET(20 MG) BY MOUTH DAILY AS NEEDED FOR SWELLING   gabapentin (NEURONTIN) 100 MG capsule Take 2 capsules (200 mg total) by mouth 3 (three) times  daily.   GEMTESA 75 MG TABS Take 1 tablet by mouth daily.   latanoprost (XALATAN) 0.005 % ophthalmic solution 1 drop at bedtime.   levothyroxine (SYNTHROID) 150 MCG tablet Take 1 tablet (150 mcg total) by mouth daily.   lisinopril (ZESTRIL) 20 MG tablet TAKE 2 TABLETS (40 MG TOTAL) BY MOUTH DAILY.   meclizine (ANTIVERT) 25 MG tablet Take 1 tablet (25 mg total) by mouth 3 (three) times daily as needed for dizziness.   metFORMIN (GLUCOPHAGE) 500 MG tablet Take 1 tablet (500 mg total) by mouth 2 (two) times daily with a meal. (Patient taking differently: Take 500 mg by mouth daily with breakfast.)   methocarbamol (ROBAXIN) 500 MG tablet Take 1 tablet (500 mg total) by mouth 3 (three) times daily.   Multiple Vitamin (MULTIVITAMIN ADULT) TABS Take 1 tablet by mouth daily.   Multiple Vitamins-Minerals (OCUVITE PO) Take 1 tablet by mouth daily.   Omega-3 Fatty Acids (FISH OIL) 1000 MG CAPS Take 1 capsule by mouth every evening.    ondansetron (ZOFRAN) 4 MG tablet Take 1 tablet (4 mg total) by mouth every 8 (eight) hours as needed for nausea or vomiting.   pantoprazole (PROTONIX) 40 MG tablet TAKE 1 TABLET EVERY DAY   rosuvastatin (CRESTOR) 5 MG tablet Take 1 tablet (5 mg total) by mouth daily.   Semaglutide,0.25 or 0.'5MG'$ /DOS, 2 MG/3ML SOPN Inject 0.5 mg into the skin  weekly   traMADol (ULTRAM) 50 MG tablet Take 1 tablet (50 mg total) by mouth every 8 (eight) hours as needed for moderate pain or severe pain.   WIXELA INHUB 500-50 MCG/ACT AEPB INHALE 1 PUFF INTO THE LUNGS TWICE DAILY   No facility-administered encounter medications on file as of 02/06/2022.    Allergies as of 02/06/2022 - Review Complete 02/06/2022  Allergen Reaction Noted   Bee venom Anaphylaxis 02/17/2014   Betadine [povidone iodine] Anaphylaxis 12/03/2017   Codeine Shortness Of Breath and Rash 12/01/2009   Contrast media [iodinated contrast media] Shortness Of Breath 12/31/2013   Gadolinium derivatives Hives, Rash, Shortness Of  Breath, and Swelling 03/28/2014   Iodine Other (See Comments) 12/01/2009   Latex Anaphylaxis and Rash 12/20/2009   Lidocaine Shortness Of Breath and Swelling 12/20/2009   Penicillins Shortness Of Breath, Rash, and Other (See Comments) 12/01/2009   Pentazocine lactate Shortness Of Breath and Rash 12/20/2009   Shellfish allergy Shortness Of Breath and Rash 02/17/2014   Aspirin Rash and Other (See Comments) 12/20/2009   Erythromycin Swelling and Rash 12/01/2009   Symbicort [budesonide-formoterol fumarate] Other (See Comments) 08/29/2013   Compazine [prochlorperazine maleate] Rash 04/02/2014   Sulfonamide derivatives Rash 12/20/2009    Past Medical History:  Diagnosis Date   Abnormally small mouth    Achilles tendon disorder, right    Anemia    Anxiety    Arthritis    hips and knees   Asthma    daily inhaler, prn inhaler and neb.   Asthma due to environmental allergies    Breast cancer ([facility]) 01/2014   left   Chest pain    CHF (congestive heart failure) (HCC)    COPD (chronic obstructive pulmonary disease) (HCC)    Dental bridge present    upper front and lower right   Dental crowns present    x 3   Depression    Esophageal spasm    reports since her chemo and breast surgery  she developed esophageal spasms and reports this is in the past has casue her 02 to desat in the  70s , denies sycnope in relation to this , does report hx of vertigo as well    Family history of anesthesia complication    twin brother aspirated and died on OR table, per pt.   Fibromyalgia    Gallbladder disease    Gallstones    GERD (gastroesophageal reflux disease)    Glaucoma    H/O blood clots    had blood to  PICC line    History of gastric ulcer    as a teenager   History of seizure age 65   as a reaction to Penicillin - no seizures since   History of stomach ulcers    History of thyroid cancer    s/p thyroidectomy   HTN (hypertension)    Hyperlipidemia    Hypothyroidism    Joint pain     Left knee injury    Liver disorder    seen when she had her hysterectomy , reports she was told it was  " small lesion" ; but asymptomatic    Lymphedema    left arm   Migraines    Multiple food allergies    Obesity    OSA (obstructive sleep apnea) 02/16/2016   CPAP   Palpitations    reports no longer experiences   Personal history of chemotherapy 2015   Personal history of radiation therapy 2015   Left   Prediabetes  Rheumatoid arthritis (HCC)    SOB (shortness of breath)    Swelling of both ankles    Tinnitus    UTI (lower urinary tract infection) 02/17/2014   Vertigo    last episode  was 2 weeks ago    Vitamin D deficiency    Wears contact lenses    left eye only    Wears hearing aid in both ears     Past Surgical History:  Procedure Laterality Date   ABDOMINAL HYSTERECTOMY  2004   complete   ACHILLES TENDON REPAIR Right    APPENDECTOMY  2004   BREAST EXCISIONAL BIOPSY     BREAST LUMPECTOMY Left    2015   BREAST LUMPECTOMY WITH NEEDLE LOCALIZATION AND AXILLARY SENTINEL LYMPH NODE BX Left 02/23/2014   Procedure: BREAST LUMPECTOMY WITH NEEDLE LOCALIZATION AND AXILLARY SENTINEL LYMPH NODE BIOPSY;  Surgeon: Excell Seltzer, MD;  Location: [facility];  Service: General;  Laterality: Left;   BREAST SURGERY  2011   left breast biposy   CHOLECYSTECTOMY  1990   COLONOSCOPY W/ POLYPECTOMY  06/2009   EYE SURGERY Right 2013   exc. warts from underneath eyelid   INCONTINENCE SURGERY  2004   KNEE ARTHROSCOPY Bilateral    x 6 each knee   LIGAMENT REPAIR Right    thumb/wrist   ORIF TOE FRACTURE Right    great toe   PORTACATH PLACEMENT N/A 03/24/2014   Procedure: INSERTION PORT-A-CATH;  Surgeon: Excell Seltzer, MD;  Location: [facility];  Service: General;  Laterality: N/A;; removed    THYROIDECTOMY  2000   TONSILLECTOMY AND ADENOIDECTOMY  2000   TOTAL KNEE ARTHROPLASTY Left 12/03/2017   Procedure: LEFT TOTAL KNEE ARTHROPLASTY;  Surgeon:  Gaynelle Arabian, MD;  Location: [facility];  Service: Orthopedics;  Laterality: Left;   TUMOR EXCISION     from thoracic spine    Family History  Problem Relation Age of Onset   Alcohol abuse Mother    Arthritis Mother    Hypertension Mother    Bipolar disorder Mother    Breast cancer Mother 56       unconfirmed   Lung cancer Mother 89       smoker   Hyperlipidemia Mother    Stroke Mother    Cancer Mother    Depression Mother    Anxiety disorder Mother    Alcoholism Mother    Drug abuse Mother    Eating disorder Mother    Obesity Mother    Alcohol abuse Father    Hyperlipidemia Father    Kidney disease Father    Diabetes Father    Hypertension Father    Cystic kidney disease Father    Thyroid disease Father    Liver disease Father    Alcoholism Father    Lung cancer Father    Thyroid cancer Sister 104       type?; currently 15   Other Sister        ovarian tumor @ 68; TAH/BSO   Breast cancer Sister    Thyroid cancer Brother        dx 36s; currently 60   Breast cancer Maternal Aunt        dx 23s; deceased 25   Thyroid cancer Paternal 76        All 3 paternal aunts with thyroid ca in 30s/40s   Lung cancer Paternal Aunt        2 of 3 paternal aunts with lung cancer  Arthritis Maternal Grandmother    Diabetes Paternal Grandmother    Heart disease Other    COPD Other    Asthma Other     Social History   Socioeconomic History   Marital status: Married    Spouse name: [name]   Number of children: 2   Years of education: Not on file   Highest education level: Not on file  Occupational History   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   Social Determinants of Health   Financial Resource Strain: Not on file  Food Insecurity: No Food Insecurity (01/12/2021)   Hunger Vital Sign    Worried About Running Out of Food in the Last Year: Never true    Ran Out of Food in the Last Year: Never true  Transportation Needs: Not on file  Physical Activity: Not on file  Stress: Not on file  Social Connections: Not on file  Intimate Partner Violence: Not on file    Review of Systems  Constitutional:  Negative for fatigue.  Respiratory:  Positive for apnea.   Psychiatric/Behavioral:  Positive for sleep disturbance.     Vitals:   02/06/22 1537  BP: 114/70  Pulse: 75  Temp: 97.9 F (36.6 C)  SpO2: 99%     Physical Exam Constitutional:      Appearance: She is obese.  HENT:     Head: Normocephalic.     Nose: No congestion.     Mouth/Throat:     Mouth: Mucous membranes are moist.     Comments: Mallampati 4, crowded oropharynx, macroglossia Eyes:     General:        Right eye: No discharge.        Left eye: No discharge.     Pupils: Pupils are equal, round, and reactive to light.  Cardiovascular:     Rate and Rhythm: Normal rate and regular rhythm.     Heart sounds: No murmur heard.    No friction rub.  Pulmonary:     Effort: No respiratory distress.     Breath sounds: No stridor. No wheezing or rhonchi.  Musculoskeletal:     Cervical back: No rigidity or tenderness.  Neurological:     Mental Status: She is alert.  Psychiatric:        Mood and Affect: Mood normal.    Data Reviewed: Recent sleep study reviewed showing mild obstructive sleep apnea  Compliance shows 100% compliance Average use of 8 hours 45 minutes Machine set between 5 and 20 95 percentile pressure of 12.3 Residual AHI 1.4  Echocardiogram 08/23/2019 shows  normal ejection fraction diastolic dysfunction  Assessment:  Mild obstructive sleep apnea -Adequately treated with CPAP therapy  Asthma -Stable symptoms -On Wixela 500 -We will decrease dose to Wixela 250  Class III obesity -Has lost about 40 pounds so far and continuing to lose  Needs new mask  Plan/Recommendations: Continue auto CPAP 5-20  DME referral for new mask  Decrease dose of Wixela from 500-250  We can consider a repeat sleep study once close to goal weight  Sherrilyn Rist MD [facility] 02/06/2022, 3:49 PM  CC: Debbrah Alar, NP

## 2022-02-06 NOTE — Patient Instructions (Addendum)
DME for CPAP supplies  Mild obstructive sleep apnea with significant weight loss  Continue using your CPAP at present, once you get to a weight close to where you want to be-we can get a sleep study at that time  Call with significant concerns  Decrease dose of Wixela from 500 to 250  Follow-up in 6 months

## 2022-02-06 NOTE — Addendum Note (Signed)
Addended by: Dessie Coma on: 02/06/2022 04:12 PM   Modules accepted: Orders

## 2022-02-07 ENCOUNTER — Ambulatory Visit: Payer: Medicare Other | Admitting: Family

## 2022-02-07 ENCOUNTER — Ambulatory Visit (INDEPENDENT_AMBULATORY_CARE_PROVIDER_SITE_OTHER): Payer: Medicare Other | Admitting: Family

## 2022-02-07 VITALS — BP 120/74 | HR 68 | Temp 97.8°F | Resp 16 | Wt 257.0 lb

## 2022-02-07 DIAGNOSIS — M7672 Peroneal tendinitis, left leg: Secondary | ICD-10-CM | POA: Insufficient documentation

## 2022-02-07 DIAGNOSIS — I10 Essential (primary) hypertension: Secondary | ICD-10-CM

## 2022-02-07 DIAGNOSIS — R42 Dizziness and giddiness: Secondary | ICD-10-CM | POA: Diagnosis not present

## 2022-02-07 DIAGNOSIS — M25572 Pain in left ankle and joints of left foot: Secondary | ICD-10-CM | POA: Diagnosis not present

## 2022-02-07 MED ORDER — MECLIZINE HCL 25 MG PO TABS: 25.0000 mg | ORAL_TABLET | Freq: Three times a day (TID) | ORAL | 0 refills | Status: DC | PRN

## 2022-02-07 NOTE — Progress Notes (Signed)
Subjective:   By signing my name below, I, [name] Perches, attest that this documentation has been prepared under the direction and in the presence of Michelle Chimera, NP 02/07/2022   Patient ID: Michelle Kennedy, female    DOB: 29-Aug-1955, 66 y.o.   MRN: [id number]  Chief Complaint  Patient presents with   Dizziness    Complains of mild dizziness 4 to 5 times per week.     HPI Patient is in today for an office visit. She is accompanied by her partner.   Refills: She is requesting a refill of 25 Mg of Meclizine for travel/vertigo symptoms. Travel/Compression Socks: She reports that she will soon be going to [place] and is inquiring about whether to wear compression socks on the flight to the destination. Immunizations: She is UTD on her pneumonia vaccine Endocrinologist/Thyroid: She went to her endocrinologist, Dr. Cruzita Lederer and her synthroid dose was decreased.  Lab Results  Component Value Date   TSH 0.05 (L) 02/02/2022   T3TOTAL 130 09/20/2016   Ankle Pain:  She states that her left ankle pain is persistent. She wears good supportive shoes which alleviates some of her pain. She also occasionally wears a brace. Her symptoms are decreasing in severity.  Blood Pressure: As of today's visit, her blood pressure is normal. She is currently taking 5 Mg of Amlodipine and 2 tablets of 20 Mg of Lisinopril BP Readings from Last 3 Encounters:  02/07/22 120/74  02/06/22 114/70  02/02/22 128/78   Pulse Readings from Last 3 Encounters:  02/07/22 68  02/06/22 75  02/02/22 71   Weight: She reports that she is losing weight. She is currently taking 0.5 Mg of Ozempic.  Wt Readings from Last 3 Encounters:  02/07/22 257 lb (116.6 kg)  02/06/22 256 lb (116.1 kg)  02/02/22 255 lb 9.6 oz (115.9 kg)    Health Maintenance Due  Topic Date Due   COVID-19 Vaccine (8 - Pfizer risk series) 10/15/2020    Past Medical History:  Diagnosis Date   Abnormally small mouth    Achilles tendon  disorder, right    Anemia    Anxiety    Arthritis    hips and knees   Asthma    daily inhaler, prn inhaler and neb.   Asthma due to environmental allergies    Breast cancer ([facility]) 01/2014   left   Chest pain    CHF (congestive heart failure) (HCC)    COPD (chronic obstructive pulmonary disease) (HCC)    Dental bridge present    upper front and lower right   Dental crowns present    x 3   Depression    Esophageal spasm    reports since her chemo and breast surgery  she developed esophageal spasms and reports this is in the past has casue her 02 to desat in the  70s , denies sycnope in relation to this , does report hx of vertigo as well    Family history of anesthesia complication    twin brother aspirated and died on OR table, per pt.   Fibromyalgia    Gallbladder disease    Gallstones    GERD (gastroesophageal reflux disease)    Glaucoma    H/O blood clots    had blood to  PICC line    History of gastric ulcer    as a teenager   History of seizure age 15   as a reaction to Penicillin - no seizures since   History  of stomach ulcers    History of thyroid cancer    s/p thyroidectomy   HTN (hypertension)    Hyperlipidemia    Hypothyroidism    Joint pain    Left knee injury    Liver disorder    seen when she had her hysterectomy , reports she was told it was  " small lesion" ; but asymptomatic    Lymphedema    left arm   Migraines    Multiple food allergies    Obesity    OSA (obstructive sleep apnea) 02/16/2016   CPAP   Palpitations    reports no longer experiences   Personal history of chemotherapy 2015   Personal history of radiation therapy 2015   Left   Prediabetes    Rheumatoid arthritis (HCC)    SOB (shortness of breath)    Swelling of both ankles    Tinnitus    UTI (lower urinary tract infection) 02/17/2014   Vertigo    last episode  was 2 weeks ago    Vitamin D deficiency    Wears contact lenses    left eye only    Wears hearing aid in both ears      Past Surgical History:  Procedure Laterality Date   ABDOMINAL HYSTERECTOMY  2004   complete   ACHILLES TENDON REPAIR Right    APPENDECTOMY  2004   BREAST EXCISIONAL BIOPSY     BREAST LUMPECTOMY Left    2015   BREAST LUMPECTOMY WITH NEEDLE LOCALIZATION AND AXILLARY SENTINEL LYMPH NODE BX Left 02/23/2014   Procedure: BREAST LUMPECTOMY WITH NEEDLE LOCALIZATION AND AXILLARY SENTINEL LYMPH NODE BIOPSY;  Surgeon: Excell Seltzer, MD;  Location: [facility];  Service: General;  Laterality: Left;   BREAST SURGERY  2011   left breast biposy   CHOLECYSTECTOMY  1990   COLONOSCOPY W/ POLYPECTOMY  06/2009   EYE SURGERY Right 2013   exc. warts from underneath eyelid   INCONTINENCE SURGERY  2004   KNEE ARTHROSCOPY Bilateral    x 6 each knee   LIGAMENT REPAIR Right    thumb/wrist   ORIF TOE FRACTURE Right    great toe   PORTACATH PLACEMENT N/A 03/24/2014   Procedure: INSERTION PORT-A-CATH;  Surgeon: Excell Seltzer, MD;  Location: [facility];  Service: General;  Laterality: N/A;; removed    THYROIDECTOMY  2000   TONSILLECTOMY AND ADENOIDECTOMY  2000   TOTAL KNEE ARTHROPLASTY Left 12/03/2017   Procedure: LEFT TOTAL KNEE ARTHROPLASTY;  Surgeon: Gaynelle Arabian, MD;  Location: [facility];  Service: Orthopedics;  Laterality: Left;   TUMOR EXCISION     from thoracic spine    Family History  Problem Relation Age of Onset   Alcohol abuse Mother    Arthritis Mother    Hypertension Mother    Bipolar disorder Mother    Breast cancer Mother 65       unconfirmed   Lung cancer Mother 58       smoker   Hyperlipidemia Mother    Stroke Mother    Cancer Mother    Depression Mother    Anxiety disorder Mother    Alcoholism Mother    Drug abuse Mother    Eating disorder Mother    Obesity Mother    Alcohol abuse Father    Hyperlipidemia Father    Kidney disease Father    Diabetes Father    Hypertension Father    Cystic kidney disease Father    Thyroid disease  Father    Liver disease Father    Alcoholism Father    Lung cancer Father    Thyroid cancer Sister 15       type?; currently 83   Other Sister        ovarian tumor @ 39; TAH/BSO   Breast cancer Sister    Thyroid cancer Brother        dx 6s; currently 26   Breast cancer Maternal Aunt        dx 47s; deceased 43   Thyroid cancer Paternal 34        All 3 paternal aunts with thyroid ca in 30s/40s   Lung cancer Paternal Aunt        2 of 3 paternal aunts with lung cancer   Arthritis Maternal Grandmother    Diabetes Paternal Grandmother    Heart disease Other    COPD Other    Asthma Other     Social History   Socioeconomic History   Marital status: Married    Spouse name: [name]   Number of children: 2   Years of education: Not on file   Highest education level: Not on file  Occupational History   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   Social Determinants of Health   Financial Resource Strain: Not on file  Food Insecurity: No Food Insecurity (01/12/2021)   Hunger Vital Sign    Worried About Running Out of Food in the Last Year: Never true    Ran Out of Food in the Last Year: Never true  Transportation Needs: Not on file  Physical Activity: Not on file  Stress: Not on file  Social Connections: Not on file  Intimate Partner Violence: Not on file    Outpatient Medications Prior to Visit  Medication Sig Dispense Refill   albuterol (PROVENTIL) (5 MG/ML) 0.5%  nebulizer solution Take 0.5 mLs (2.5 mg total) by nebulization every 6 (six) hours as needed for wheezing or shortness of breath. 40 mL 3   albuterol (VENTOLIN HFA) 108 (90 Base) MCG/ACT inhaler USE 2 INHALATIONS ORALLY   EVERY 6 HOURS AS NEEDED FORWHEEZING OR SHORTNESS OF   BREATH. 54 g 1   amLODipine (NORVASC) 5 MG tablet Take 1 tablet by mouth once daily 90 tablet 0   anastrozole (ARIMIDEX) 1 MG tablet Take 1 tablet (1 mg total) by mouth daily. 90 tablet 3   CALCIUM PO Take 1 tablet by mouth every evening.     cholecalciferol (VITAMIN D3) 25 MCG (1000 UNIT) tablet Take 1,000 Units by mouth every evening.      clopidogrel (PLAVIX) 75 MG tablet TAKE 1 TABLET EVERY DAY 90 tablet 1   EPINEPHrine 0.3 mg/0.3 mL IJ SOAJ injection Inject 0.3 mg into the muscle as needed for anaphylaxis. 2 each 1   fluticasone-salmeterol (WIXELA INHUB) 250-50 MCG/ACT AEPB Inhale 1  puff into the lungs in the morning and at bedtime. 180 each 6   furosemide (LASIX) 20 MG tablet TAKE 1 TABLET(20 MG) BY MOUTH DAILY AS NEEDED FOR SWELLING 30 tablet 3   gabapentin (NEURONTIN) 100 MG capsule Take 2 capsules (200 mg total) by mouth 3 (three) times daily. 540 capsule 1   GEMTESA 75 MG TABS Take 1 tablet by mouth daily.     latanoprost (XALATAN) 0.005 % ophthalmic solution 1 drop at bedtime.     levothyroxine (SYNTHROID) 150 MCG tablet Take 1 tablet (150 mcg total) by mouth daily. 45 tablet 3   lisinopril (ZESTRIL) 20 MG tablet TAKE 2 TABLETS (40 MG TOTAL) BY MOUTH DAILY. 180 tablet 0   metFORMIN (GLUCOPHAGE) 500 MG tablet Take 1 tablet (500 mg total) by mouth 2 (two) times daily with a meal. (Patient taking differently: Take 500 mg by mouth daily with breakfast.) 180 tablet 2   methocarbamol (ROBAXIN) 500 MG tablet Take 1 tablet (500 mg total) by mouth 3 (three) times daily. 90 tablet 0   Multiple Vitamin (MULTIVITAMIN ADULT) TABS Take 1 tablet by mouth daily.     Multiple Vitamins-Minerals (OCUVITE PO) Take 1 tablet by mouth  daily.     Omega-3 Fatty Acids (FISH OIL) 1000 MG CAPS Take 1 capsule by mouth every evening.      ondansetron (ZOFRAN) 4 MG tablet Take 1 tablet (4 mg total) by mouth every 8 (eight) hours as needed for nausea or vomiting. 20 tablet 0   pantoprazole (PROTONIX) 40 MG tablet TAKE 1 TABLET EVERY DAY 90 tablet 1   rosuvastatin (CRESTOR) 5 MG tablet Take 1 tablet (5 mg total) by mouth daily. 90 tablet 3   Semaglutide,0.25 or 0.'5MG'$ /DOS, 2 MG/3ML SOPN Inject 0.5 mg into the skin weekly 9 mL 0   traMADol (ULTRAM) 50 MG tablet Take 1 tablet (50 mg total) by mouth every 8 (eight) hours as needed for moderate pain or severe pain. 21 tablet 0   meclizine (ANTIVERT) 25 MG tablet Take 1 tablet (25 mg total) by mouth 3 (three) times daily as needed for dizziness. 30 tablet 0   No facility-administered medications prior to visit.    Allergies  Allergen Reactions   Bee Venom Anaphylaxis   Betadine [Povidone Iodine] Anaphylaxis    Rash. Breathing problems.    Codeine Shortness Of Breath and Rash   Contrast Media [Iodinated Contrast Media] Shortness Of Breath   Gadolinium Derivatives Hives, Rash, Shortness Of Breath and Swelling    "my heart stopped beating"    Iodine Other (See Comments)    CARDIAC ARREST   Latex Anaphylaxis and Rash   Lidocaine Shortness Of Breath and Swelling    SWELLING OF MOUTH AND THROAT, low BP   Penicillins Shortness Of Breath, Rash and Other (See Comments)    SEIZURE Did it involve swelling of the face/tongue/throat, SOB, or low BP? no Did it involve sudden or severe rash/hives, skin peeling, or any reaction on the inside of your mouth or nose? yes Did you need to seek medical attention at a hospital or doctor's office? yes When did it last happen?      2010 If all above answers are "NO", may proceed with cephalosporin use.    Pentazocine Lactate Shortness Of Breath and Rash   Shellfish Allergy Shortness Of Breath and Rash   Aspirin Rash and Other (See Comments)    GI  UPSET   Erythromycin Swelling and Rash    SWELLING OF JOINTS  Symbicort [Budesonide-Formoterol Fumarate] Other (See Comments)    BURNING OF TONGUE AND LIPS   Compazine [Prochlorperazine Maleate] Rash    Rash on face,chest, arms, back   Sulfonamide Derivatives Rash    ROS See HPI    Objective:    Physical Exam Constitutional:      General: She is not in acute distress.    Appearance: Normal appearance. She is not ill-appearing.  HENT:     Head: Normocephalic and atraumatic.     Right Ear: External ear normal.     Left Ear: External ear normal.  Eyes:     Extraocular Movements: Extraocular movements intact.     Pupils: Pupils are equal, round, and reactive to light.  Cardiovascular:     Rate and Rhythm: Normal rate and regular rhythm.     Heart sounds: Normal heart sounds. No murmur heard.    No gallop.  Pulmonary:     Effort: Pulmonary effort is normal. No respiratory distress.     Breath sounds: Normal breath sounds. No wheezing or rales.  Skin:    General: Skin is warm and dry.  Neurological:     Mental Status: She is alert and oriented to person, place, and time.  Psychiatric:        Mood and Affect: Mood normal.        Behavior: Behavior normal.        Judgment: Judgment normal.     BP 120/74 (BP Location: Right Arm, Patient Position: Sitting, Cuff Size: Large)   Pulse 68   Temp 97.8 F (36.6 C) (Oral)   Resp 16   Wt 257 lb (116.6 kg)   SpO2 98%   BMI 41.48 kg/m  Wt Readings from Last 3 Encounters:  02/07/22 257 lb (116.6 kg)  02/06/22 256 lb (116.1 kg)  02/02/22 255 lb 9.6 oz (115.9 kg)       Assessment & Plan:   Problem List Items Addressed This Visit       Unprioritized   Vertigo    Stable, continue meclizine prn.       Left ankle pain - Primary    Unchanged. Will refer to sports medicine.       Relevant Orders   Ambulatory referral to Sports Medicine   Essential hypertension    BP Readings from Last 3 Encounters:  02/07/22 120/74   02/06/22 114/70  02/02/22 128/78  Stable on lisinopril, amlodipine. Continue same.         Meds ordered this encounter  Medications   meclizine (ANTIVERT) 25 MG tablet    Sig: Take 1 tablet (25 mg total) by mouth 3 (three) times daily as needed for dizziness.    Dispense:  30 tablet    Refill:  0    Order Specific Question:   Supervising Provider    Answer:   Penni Homans A [[id number]]    I, Nance Pear, NP, personally preformed the services described in this documentation.  All medical record entries made by the scribe were at my direction and in my presence.  I have reviewed the chart and discharge instructions (if applicable) and agree that the record reflects my personal performance and is accurate and complete. 02/07/2022   I,Amber Collins,acting as a Education administrator for Nance Pear, NP.,have documented all relevant documentation on the behalf of Nance Pear, NP,as directed by  Nance Pear, NP while in the presence of Nance Pear, NP.    Nance Pear, NP

## 2022-02-07 NOTE — Assessment & Plan Note (Signed)
Unchanged. Will refer to sports medicine.

## 2022-02-07 NOTE — Assessment & Plan Note (Signed)
Stable, continue meclizine prn.

## 2022-02-07 NOTE — Assessment & Plan Note (Addendum)
BP Readings from Last 3 Encounters:  02/07/22 120/74  02/06/22 114/70  02/02/22 128/78   Stable on lisinopril, amlodipine. Continue same.

## 2022-02-08 ENCOUNTER — Encounter: Payer: Self-pay | Admitting: Bariatrics

## 2022-02-08 NOTE — Progress Notes (Signed)
Chief Complaint:   OBESITY Michelle Kennedy is here to discuss her progress with her obesity treatment plan along with follow-up of her obesity related diagnoses. Michelle Kennedy is on the Category 4 Plan and states she is following her eating plan approximately 70% of the time. Michelle Kennedy states she is doing senior aerobics, yoga, and bike riding for 30 minutes 7 times per week.  Today's visit was #: 9 Starting weight: 275 lbs Starting date: 07/28/2021 Today's weight: 253 lbs Today's date: 02/01/2022 Total lbs lost to date: 22 Total lbs lost since last in-office visit: 4  Interim History: Michelle Kennedy is down an additional 4 lbs. She is getting adequate water and protein. She has a good variety.   Subjective:   1. Pre-diabetes Michelle Kennedy's appetite is controlled on Ozempic. She notes light constipation.   2. Visceral obesity Michelle Kennedy's visceral fat rating is 18, and ideal is below 13.  Assessment/Plan:   1. Pre-diabetes Michelle Kennedy will continue Ozempic 0.5 mg once weekly, and we will refill for 1 month.   - Semaglutide,0.25 or 0.'5MG'$ /DOS, 2 MG/3ML SOPN; Inject 0.5 mg into the skin weekly  Dispense: 9 mL; Refill: 0  2. Visceral obesity Michelle Kennedy will continue her meal plan and exercise.   3. Obesity, current BMI 40.9 Michelle Kennedy is currently in the action stage of change. As such, her goal is to continue with weight loss efforts. She has agreed to the Category 4 Plan.   Meal planning was discussed. Have a small appetizer 15-20 minutes. Recipes were given.  Exercise goals: As is.   Behavioral modification strategies: increasing lean protein intake, decreasing simple carbohydrates, increasing vegetables, increasing water intake, decreasing eating out, no skipping meals, meal planning and cooking strategies, keeping healthy foods in the home, and planning for success.  Michelle Kennedy has agreed to follow-up with our clinic in 4 weeks. She was informed of the importance of frequent follow-up visits to maximize her  success with intensive lifestyle modifications for her multiple health conditions.   Objective:   Blood pressure 115/75, pulse 71, temperature 97.9 F (36.6 C), height '5\' 6"'$  (1.676 m), weight 253 lb (114.8 kg), SpO2 97 %. Body mass index is 40.84 kg/m.  General: Cooperative, alert, well developed, in no acute distress. HEENT: Conjunctivae and lids unremarkable. Cardiovascular: Regular rhythm.  Lungs: Normal work of breathing. Neurologic: No focal deficits.   Lab Results  Component Value Date   CREATININE 0.75 10/28/2021   BUN 13 10/28/2021   NA 141 10/28/2021   K 4.2 10/28/2021   CL 108 10/28/2021   CO2 27 10/28/2021   Lab Results  Component Value Date   ALT 27 07/20/2021   AST 17 07/20/2021   ALKPHOS 76 07/20/2021   BILITOT 0.4 07/20/2021   Lab Results  Component Value Date   HGBA1C 5.7 (A) 02/02/2022   HGBA1C 5.6 10/28/2021   HGBA1C 5.8 (H) 07/28/2021   HGBA1C 6.0 (A) 04/22/2021   HGBA1C 6.2 10/26/2020   Lab Results  Component Value Date   INSULIN 25.6 (H) 07/28/2021   INSULIN 21.8 09/20/2016   Lab Results  Component Value Date   TSH 0.05 (L) 02/02/2022   Lab Results  Component Value Date   CHOL 187 07/28/2021   HDL 64 07/28/2021   LDLCALC 102 (H) 07/28/2021   LDLDIRECT 117.0 02/26/2019   TRIG 122 07/28/2021   CHOLHDL 3 06/28/2020   Lab Results  Component Value Date   VD25OH 53.4 07/28/2021   VD25OH 41.4 09/20/2016   Lab Results  Component Value  Date   WBC 5.1 07/28/2021   HGB 14.3 07/28/2021   HCT 45.0 07/28/2021   MCV 76 (L) 07/28/2021   PLT 255 07/28/2021   Lab Results  Component Value Date   IRON 71 06/23/2020   TIBC 294 06/23/2020   FERRITIN 94.6 06/23/2020   Attestation Statements:   Reviewed by clinician on day of visit: allergies, medications, problem list, medical history, surgical history, family history, social history, and previous encounter notes.   Wilhemena Durie, am acting as Location manager for [employer], DO.  I  have reviewed the above documentation for accuracy and completeness, and I agree with the above. Jearld Lesch, DO

## 2022-02-14 ENCOUNTER — Ambulatory Visit (INDEPENDENT_AMBULATORY_CARE_PROVIDER_SITE_OTHER): Payer: Medicare Other | Admitting: Family Medicine

## 2022-02-14 ENCOUNTER — Ambulatory Visit: Payer: Self-pay

## 2022-02-14 ENCOUNTER — Encounter: Payer: Self-pay | Admitting: Family Medicine

## 2022-02-14 VITALS — BP 152/90 | Ht 66.0 in | Wt 257.0 lb

## 2022-02-14 DIAGNOSIS — M1711 Unilateral primary osteoarthritis, right knee: Secondary | ICD-10-CM | POA: Diagnosis not present

## 2022-02-14 DIAGNOSIS — M25572 Pain in left ankle and joints of left foot: Secondary | ICD-10-CM

## 2022-02-14 DIAGNOSIS — M7672 Peroneal tendinitis, left leg: Secondary | ICD-10-CM

## 2022-02-14 MED ORDER — BETAMETHASONE SOD PHOS & ACET 6 (3-3) MG/ML IJ SUSP
6.0000 mg | Freq: Once | INTRAMUSCULAR | Status: AC
  Administered 2022-02-14: 6 mg via INTRA_ARTICULAR

## 2022-02-14 NOTE — Progress Notes (Signed)
Michelle Kennedy - 66 y.o. female MRN [id number]  Date of birth: 04-04-1956  SUBJECTIVE:  Including CC & ROS.  No chief complaint on file.   Michelle Kennedy is a 66 y.o. female that is presenting with acute on chronic lateral ankle pain.  Has been ongoing for about 3 months.  Presenting with acute on chronic right knee pain.  She has tried gel injection and steroid injections in the past.  Review of the office note from 6/9 shows an x-ray was completed. Independent review of the left ankle x-ray from 6/9 shows no acute changes.  Review of Systems See HPI   HISTORY: Past Medical, Surgical, Social, and Family History Reviewed & Updated per EMR.   Pertinent Historical Findings include:  Past Medical History:  Diagnosis Date   Abnormally small mouth    Achilles tendon disorder, right    Anemia    Anxiety    Arthritis    hips and knees   Asthma    daily inhaler, prn inhaler and neb.   Asthma Kennedy to environmental allergies    Breast cancer ([place]) 01/2014   left   Chest pain    CHF (congestive heart failure) (HCC)    COPD (chronic obstructive pulmonary disease) (HCC)    Dental bridge present    upper front and lower right   Dental crowns present    x 3   Depression    Esophageal spasm    reports since her chemo and breast surgery  she developed esophageal spasms and reports this is in the past has casue her 02 to desat in the  70s , denies sycnope in relation to this , does report hx of vertigo as well    Family history of anesthesia complication    twin brother aspirated and died on OR table, per pt.   Fibromyalgia    Gallbladder disease    Gallstones    GERD (gastroesophageal reflux disease)    Glaucoma    H/O blood clots    had blood to  PICC line    History of gastric ulcer    as a teenager   History of seizure age 68   as a reaction to Penicillin - no seizures since   History of stomach ulcers    History of thyroid cancer    s/p thyroidectomy   HTN  (hypertension)    Hyperlipidemia    Hypothyroidism    Joint pain    Left knee injury    Liver disorder    seen when she had her hysterectomy , reports she was told it was  " small lesion" ; but asymptomatic    Lymphedema    left arm   Migraines    Multiple food allergies    Obesity    OSA (obstructive sleep apnea) 02/16/2016   CPAP   Palpitations    reports no longer experiences   Personal history of chemotherapy 2015   Personal history of radiation therapy 2015   Left   Prediabetes    Rheumatoid arthritis (HCC)    SOB (shortness of breath)    Swelling of both ankles    Tinnitus    UTI (lower urinary tract infection) 02/17/2014   Vertigo    last episode  was 2 weeks ago    Vitamin D deficiency    Wears contact lenses    left eye only    Wears hearing aid in both ears     Past Surgical History:  Procedure Laterality Date   ABDOMINAL HYSTERECTOMY  2004   complete   ACHILLES TENDON REPAIR Right    APPENDECTOMY  2004   BREAST EXCISIONAL BIOPSY     BREAST LUMPECTOMY Left    2015   BREAST LUMPECTOMY WITH NEEDLE LOCALIZATION AND AXILLARY SENTINEL LYMPH NODE BX Left 02/23/2014   Procedure: BREAST LUMPECTOMY WITH NEEDLE LOCALIZATION AND AXILLARY SENTINEL LYMPH NODE BIOPSY;  Surgeon: Excell Seltzer, MD;  Location: [facility];  Service: General;  Laterality: Left;   BREAST SURGERY  2011   left breast biposy   CHOLECYSTECTOMY  1990   COLONOSCOPY W/ POLYPECTOMY  06/2009   EYE SURGERY Right 2013   exc. warts from underneath eyelid   INCONTINENCE SURGERY  2004   KNEE ARTHROSCOPY Bilateral    x 6 each knee   LIGAMENT REPAIR Right    thumb/wrist   ORIF TOE FRACTURE Right    great toe   PORTACATH PLACEMENT N/A 03/24/2014   Procedure: INSERTION PORT-A-CATH;  Surgeon: Excell Seltzer, MD;  Location: [facility];  Service: General;  Laterality: N/A;; removed    THYROIDECTOMY  2000   TONSILLECTOMY AND ADENOIDECTOMY  2000   TOTAL KNEE ARTHROPLASTY  Left 12/03/2017   Procedure: LEFT TOTAL KNEE ARTHROPLASTY;  Surgeon: Gaynelle Arabian, MD;  Location: [facility];  Service: Orthopedics;  Laterality: Left;   TUMOR EXCISION     from thoracic spine     PHYSICAL EXAM:  VS: BP (!) 152/90 (BP Location: Left Arm, Patient Position: Sitting)   Ht '5\' 6"'$  (1.676 m)   Wt 257 lb (116.6 kg)   BMI 41.48 kg/m  Physical Exam Gen: NAD, alert, cooperative with exam, well-appearing MSK:  Neurovascularly intact    Limited ultrasound: Left ankle:  Enlargement and swelling of the peroneal tendons proximal to the lateral malleolus. Normal insertion of the peroneal brevis. No ankle effusion.  Summary: Findings consistent with peroneal tendinitis.  Ultrasound and interpretation by Clearance Coots, MD   Aspiration/Injection Procedure Note Michelle Kennedy 23-Dec-1955  Procedure: Injection Indications: Left ankle pain  Procedure Details Consent: Risks of procedure as well as the alternatives and risks of each were explained to the (patient/caregiver).  Consent for procedure obtained. Time Out: Verified patient identification, verified procedure, site/side was marked, verified correct patient position, special equipment/implants available, medications/allergies/relevent history reviewed, required imaging and test results available.  Performed.  The area was cleaned with iodine and alcohol swabs.    The left peroneal tendon sheath was injected using 1 cc's of 6 mg betamethasone  with a 25 1 1/2" needle.  Ultrasound was used. Images were obtained in thesho significant.  rt views showing the injection.     A sterile dressing was applied.  Patient did tolerate procedure well.     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  Symptoms most consistent with the peroneal tendon sheath. -Counseled on home exercise therapy and supportive care. -Counseled on compression -Injection. -We will pursue custom  orthotics.  OA (osteoarthritis) of knee Acute on chronic in nature.  She has tried gel injections as well as steroid injections in the past with limited improvement. -Counseled on home exercise therapy and supportive care. -pursue zilretta injection

## 2022-02-14 NOTE — Assessment & Plan Note (Signed)
Acute on chronic in nature.  Symptoms most consistent with the peroneal tendon sheath. -Counseled on home exercise therapy and supportive care. -Counseled on compression -Injection. -We will pursue custom orthotics.

## 2022-02-14 NOTE — Assessment & Plan Note (Signed)
Acute on chronic in nature.  She has tried gel injections as well as steroid injections in the past with limited improvement. -Counseled on home exercise therapy and supportive care. -pursue zilretta injection

## 2022-02-14 NOTE — Patient Instructions (Signed)
Good to see you Please use ice  Please consider compression  Please try the exercises   Please send me a message in MyChart with any questions or updates.  Please see me back to have orthotics made.   --Dr. Raeford Razor

## 2022-02-15 ENCOUNTER — Ambulatory Visit: Payer: Medicare Other | Admitting: Orthopaedic Surgery

## 2022-02-20 ENCOUNTER — Telehealth: Payer: Self-pay | Admitting: Family Medicine

## 2022-02-20 NOTE — Telephone Encounter (Signed)
Submitted request today for right knee zilretta. Uploaded last office note.

## 2022-02-22 ENCOUNTER — Other Ambulatory Visit: Payer: Self-pay

## 2022-02-22 ENCOUNTER — Ambulatory Visit: Payer: Medicare Other | Admitting: Family Medicine

## 2022-02-22 MED ORDER — LEVOTHYROXINE SODIUM 150 MCG PO TABS: 150.0000 ug | ORAL_TABLET | Freq: Every day | ORAL | 3 refills | Status: DC

## 2022-02-23 ENCOUNTER — Other Ambulatory Visit: Payer: Self-pay

## 2022-02-23 MED ORDER — AMLODIPINE BESYLATE 5 MG PO TABS: 5.0000 mg | ORAL_TABLET | Freq: Every day | ORAL | 1 refills | Status: DC

## 2022-02-28 ENCOUNTER — Telehealth: Payer: Self-pay

## 2022-02-28 NOTE — Telephone Encounter (Signed)
See 02/20/22 tele encounter. Bri will contact pt to schedule OV.

## 2022-02-28 NOTE — Telephone Encounter (Signed)
Patient was calling to see if the insurance has responded back from when the zilretta was ran. Informed patient will contact once we have a second to look through the faxes, patient stated understanding.

## 2022-02-28 NOTE — Telephone Encounter (Signed)
Per [employer]:  Right knee Orvan Seen is covered by Medicare. OV scheduled 03/01/22. $0 OOP.

## 2022-03-01 ENCOUNTER — Telehealth: Payer: Self-pay

## 2022-03-01 ENCOUNTER — Ambulatory Visit (INDEPENDENT_AMBULATORY_CARE_PROVIDER_SITE_OTHER): Payer: Medicare Other | Admitting: Family Medicine

## 2022-03-01 ENCOUNTER — Telehealth: Payer: Self-pay | Admitting: Orthopaedic Surgery

## 2022-03-01 ENCOUNTER — Ambulatory Visit: Payer: Self-pay

## 2022-03-01 ENCOUNTER — Encounter: Payer: Self-pay | Admitting: Family

## 2022-03-01 ENCOUNTER — Encounter: Payer: Self-pay | Admitting: Family Medicine

## 2022-03-01 ENCOUNTER — Other Ambulatory Visit: Payer: Self-pay | Admitting: Physician Assistant

## 2022-03-01 VITALS — BP 110/73 | Ht 66.0 in | Wt 257.0 lb

## 2022-03-01 DIAGNOSIS — M1711 Unilateral primary osteoarthritis, right knee: Secondary | ICD-10-CM

## 2022-03-01 MED ORDER — TRIAMCINOLONE ACETONIDE 32 MG IX SRER
32.0000 mg | Freq: Once | INTRA_ARTICULAR | Status: AC
  Administered 2022-03-01: 32 mg via INTRA_ARTICULAR

## 2022-03-01 NOTE — Telephone Encounter (Signed)
Patient would like a refill on Tramadol and methocarbamol, please advise please send to [facility]

## 2022-03-01 NOTE — Assessment & Plan Note (Signed)
Completed zilretta injection.  ?

## 2022-03-01 NOTE — Progress Notes (Signed)
Michelle Kennedy - 66 y.o. female MRN [id number]  Date of birth: [birth year]  SUBJECTIVE:  Including CC & ROS.  No chief complaint on file.   Michelle Kennedy is a 66 y.o. female that is  here for zilretta injection.    Review of Systems See HPI   HISTORY: Past Medical, Surgical, Social, and Family History Reviewed & Updated per EMR.   Pertinent Historical Findings include:  Past Medical History:  Diagnosis Date   Abnormally small mouth    Achilles tendon disorder, right    Anemia    Anxiety    Arthritis    hips and knees   Asthma    daily inhaler, prn inhaler and neb.   Asthma due to environmental allergies    Breast cancer ([place]) 01/2014   left   Chest pain    CHF (congestive heart failure) (HCC)    COPD (chronic obstructive pulmonary disease) (HCC)    Dental bridge present    upper front and lower right   Dental crowns present    x 3   Depression    Esophageal spasm    reports since her chemo and breast surgery  she developed esophageal spasms and reports this is in the past has casue her 02 to desat in the  70s , denies sycnope in relation to this , does report hx of vertigo as well    Family history of anesthesia complication    twin brother aspirated and died on OR table, per pt.   Fibromyalgia    Gallbladder disease    Gallstones    GERD (gastroesophageal reflux disease)    Glaucoma    H/O blood clots    had blood to  PICC line    History of gastric ulcer    as a teenager   History of seizure age 26   as a reaction to Penicillin - no seizures since   History of stomach ulcers    History of thyroid cancer    s/p thyroidectomy   HTN (hypertension)    Hyperlipidemia    Hypothyroidism    Joint pain    Left knee injury    Liver disorder    seen when she had her hysterectomy , reports she was told it was  " small lesion" ; but asymptomatic    Lymphedema    left arm   Migraines    Multiple food allergies    Obesity    OSA (obstructive sleep apnea)  02/16/2016   CPAP   Palpitations    reports no longer experiences   Personal history of chemotherapy 2015   Personal history of radiation therapy 2015   Left   Prediabetes    Rheumatoid arthritis (HCC)    SOB (shortness of breath)    Swelling of both ankles    Tinnitus    UTI (lower urinary tract infection) 02/17/2014   Vertigo    last episode  was 2 weeks ago    Vitamin D deficiency    Wears contact lenses    left eye only    Wears hearing aid in both ears     Past Surgical History:  Procedure Laterality Date   ABDOMINAL HYSTERECTOMY  2004   complete   ACHILLES TENDON REPAIR Right    APPENDECTOMY  2004   BREAST EXCISIONAL BIOPSY     BREAST LUMPECTOMY Left    2015   BREAST LUMPECTOMY WITH NEEDLE LOCALIZATION AND AXILLARY SENTINEL LYMPH NODE BX Left 02/23/2014  Procedure: BREAST LUMPECTOMY WITH NEEDLE LOCALIZATION AND AXILLARY SENTINEL LYMPH NODE BIOPSY;  Surgeon: Excell Seltzer, MD;  Location: [facility];  Service: General;  Laterality: Left;   BREAST SURGERY  2011   left breast biposy   CHOLECYSTECTOMY  1990   COLONOSCOPY W/ POLYPECTOMY  06/2009   EYE SURGERY Right 2013   exc. warts from underneath eyelid   INCONTINENCE SURGERY  2004   KNEE ARTHROSCOPY Bilateral    x 6 each knee   LIGAMENT REPAIR Right    thumb/wrist   ORIF TOE FRACTURE Right    great toe   PORTACATH PLACEMENT N/A 03/24/2014   Procedure: INSERTION PORT-A-CATH;  Surgeon: Excell Seltzer, MD;  Location: [facility];  Service: General;  Laterality: N/A;; removed    THYROIDECTOMY  2000   TONSILLECTOMY AND ADENOIDECTOMY  2000   TOTAL KNEE ARTHROPLASTY Left 12/03/2017   Procedure: LEFT TOTAL KNEE ARTHROPLASTY;  Surgeon: Gaynelle Arabian, MD;  Location: [facility];  Service: Orthopedics;  Laterality: Left;   TUMOR EXCISION     from thoracic spine     PHYSICAL EXAM:  VS: BP 110/73 (BP Location: Left Arm, Patient Position: Sitting)   Ht '5\' 6"'$  (1.676 m)   Wt 257 lb (116.6 kg)    BMI 41.48 kg/m  Physical Exam Gen: NAD, alert, cooperative with exam, well-appearing MSK:  Neurovascularly intact     Aspiration/Injection Procedure Note Michelle Kennedy [birth year]  Procedure: Injection Indications: right knee pain  Procedure Details Consent: Risks of procedure as well as the alternatives and risks of each were explained to the (patient/caregiver).  Consent for procedure obtained. Time Out: Verified patient identification, verified procedure, site/side was marked, verified correct patient position, special equipment/implants available, medications/allergies/relevent history reviewed, required imaging and test results available.  Performed.  The area was cleaned with iodine and alcohol swabs.    The right knee superior lateral suprapatellar pouch was injected using 3 cc of 1% lidocaine on a 25-gauge 1-1/2 inch needle.  An 18-gauge 1-1/2 needle was used to achieve aspiration.  The syringe was switched and a mixture containing 5 cc's of 32 mg Zilretta and 4 cc's of 0.25% bupivacaine was injected.  Ultrasound was used. Images were obtained in long views showing the injection.    A sterile dressing was applied.  Patient did tolerate procedure well.       ASSESSMENT & PLAN:   OA (osteoarthritis) of knee Completed zilretta injection

## 2022-03-01 NOTE — Telephone Encounter (Signed)
Patient came by office and she reports per Dr. Raeford Razor she will need to continue tramadol 50 mgand methocarbamol 500  mg with pcp because it will be a long ter usage of the medications.  Patient reports she uses tramadol prn and methocarbamol once daily.  Please advise if ov needed. Patient is getting ready to go to [place] 03-11-22 and will need refills before then.

## 2022-03-02 ENCOUNTER — Encounter: Payer: Self-pay | Admitting: Internal Medicine

## 2022-03-02 ENCOUNTER — Encounter: Payer: Self-pay | Admitting: Bariatrics

## 2022-03-02 ENCOUNTER — Ambulatory Visit (INDEPENDENT_AMBULATORY_CARE_PROVIDER_SITE_OTHER): Payer: Medicare Other | Admitting: Bariatrics

## 2022-03-02 VITALS — BP 119/71 | HR 69 | Temp 97.8°F | Ht 66.0 in | Wt 249.0 lb

## 2022-03-02 DIAGNOSIS — E782 Mixed hyperlipidemia: Secondary | ICD-10-CM | POA: Diagnosis not present

## 2022-03-02 DIAGNOSIS — E669 Obesity, unspecified: Secondary | ICD-10-CM | POA: Diagnosis not present

## 2022-03-02 DIAGNOSIS — E66813 Obesity, class 3: Secondary | ICD-10-CM

## 2022-03-02 DIAGNOSIS — E65 Localized adiposity: Secondary | ICD-10-CM | POA: Diagnosis not present

## 2022-03-02 DIAGNOSIS — R7303 Prediabetes: Secondary | ICD-10-CM

## 2022-03-02 DIAGNOSIS — Z6841 Body Mass Index (BMI) 40.0 and over, adult: Secondary | ICD-10-CM | POA: Insufficient documentation

## 2022-03-02 MED ORDER — SEMAGLUTIDE (1 MG/DOSE) 4 MG/3ML ~~LOC~~ SOPN: 1.0000 mg | PEN_INJECTOR | SUBCUTANEOUS | 0 refills | Status: DC

## 2022-03-02 NOTE — Telephone Encounter (Signed)
Patient advised to come in for lab appt but lab orders have not been placed for urine drug screen. Please place orders

## 2022-03-06 ENCOUNTER — Other Ambulatory Visit: Payer: Medicare Other

## 2022-03-07 ENCOUNTER — Ambulatory Visit (INDEPENDENT_AMBULATORY_CARE_PROVIDER_SITE_OTHER): Payer: Medicare Other | Admitting: Family

## 2022-03-07 VITALS — BP 132/73 | HR 98 | Temp 97.8°F | Resp 16 | Wt 254.0 lb

## 2022-03-07 DIAGNOSIS — G8929 Other chronic pain: Secondary | ICD-10-CM | POA: Diagnosis not present

## 2022-03-07 DIAGNOSIS — M5442 Lumbago with sciatica, left side: Secondary | ICD-10-CM | POA: Diagnosis not present

## 2022-03-07 MED ORDER — METHOCARBAMOL 500 MG PO TABS: 500.0000 mg | ORAL_TABLET | Freq: Three times a day (TID) | ORAL | 0 refills | Status: DC | PRN

## 2022-03-07 MED ORDER — TRAMADOL HCL 50 MG PO TABS: 50.0000 mg | ORAL_TABLET | Freq: Four times a day (QID) | ORAL | 0 refills | Status: DC | PRN

## 2022-03-07 NOTE — Progress Notes (Addendum)
Subjective:   By signing my name below, I, Michelle Kennedy, attest that this documentation has been prepared under the direction and in the presence of [name], NP 03/07/2022   Patient ID: Michelle Kennedy, female    DOB: Nov 20, 1955, 66 y.o.   MRN: [id number]  Chief Complaint  Patient presents with   Back Pain    Here for follow up history of low back pain. Needs prescription.     HPI Patient is in today for an office visit  Back Pain: She is concerned about her back pain as she will be taking 10+ hour flight to [place]. Symptoms appear in her left side that radiates downward. Symptoms worsen when she is sitting upright and when she leans over to pull up her pants. She would rate her current pain 5-6/10. Her symptoms have not gotten below a 4/10. The specialist that she goes to retired. Her medications were also disposed at home. She is requesting some form of medication for her symptoms for her trip.   Health Maintenance Due  Topic Date Due   COVID-19 Vaccine (8 - Pfizer risk series) 10/15/2020    Past Medical History:  Diagnosis Date   Abnormally small mouth    Achilles tendon disorder, right    Anemia    Anxiety    Arthritis    hips and knees   Asthma    daily inhaler, prn inhaler and neb.   Asthma due to environmental allergies    Breast cancer ([place]) 01/2014   left   Chest pain    CHF (congestive heart failure) (HCC)    COPD (chronic obstructive pulmonary disease) (HCC)    Dental bridge present    upper front and lower right   Dental crowns present    x 3   Depression    Esophageal spasm    reports since her chemo and breast surgery  she developed esophageal spasms and reports this is in the past has casue her 02 to desat in the  70s , denies sycnope in relation to this , does report hx of vertigo as well    Family history of anesthesia complication    twin brother aspirated and died on OR table, per pt.   Fibromyalgia    Gallbladder disease     Gallstones    GERD (gastroesophageal reflux disease)    Glaucoma    H/O blood clots    had blood to  PICC line    History of gastric ulcer    as a teenager   History of seizure age 67   as a reaction to Penicillin - no seizures since   History of stomach ulcers    History of thyroid cancer    s/p thyroidectomy   HTN (hypertension)    Hyperlipidemia    Hypothyroidism    Joint pain    Left knee injury    Liver disorder    seen when she had her hysterectomy , reports she was told it was  " small lesion" ; but asymptomatic    Lymphedema    left arm   Migraines    Multiple food allergies    Obesity    OSA (obstructive sleep apnea) 02/16/2016   CPAP   Palpitations    reports no longer experiences   Personal history of chemotherapy 2015   Personal history of radiation therapy 2015   Left   Prediabetes    Rheumatoid arthritis (HCC)    SOB (shortness of  breath)    Swelling of both ankles    Tinnitus    UTI (lower urinary tract infection) 02/17/2014   Vertigo    last episode  was 2 weeks ago    Vitamin D deficiency    Wears contact lenses    left eye only    Wears hearing aid in both ears     Past Surgical History:  Procedure Laterality Date   ABDOMINAL HYSTERECTOMY  2004   complete   ACHILLES TENDON REPAIR Right    APPENDECTOMY  2004   BREAST EXCISIONAL BIOPSY     BREAST LUMPECTOMY Left    2015   BREAST LUMPECTOMY WITH NEEDLE LOCALIZATION AND AXILLARY SENTINEL LYMPH NODE BX Left 02/23/2014   Procedure: BREAST LUMPECTOMY WITH NEEDLE LOCALIZATION AND AXILLARY SENTINEL LYMPH NODE BIOPSY;  Surgeon: Excell Seltzer, MD;  Location: [facility];  Service: General;  Laterality: Left;   BREAST SURGERY  2011   left breast biposy   CHOLECYSTECTOMY  1990   COLONOSCOPY W/ POLYPECTOMY  06/2009   EYE SURGERY Right 2013   exc. warts from underneath eyelid   INCONTINENCE SURGERY  2004   KNEE ARTHROSCOPY Bilateral    x 6 each knee   LIGAMENT REPAIR Right     thumb/wrist   ORIF TOE FRACTURE Right    great toe   PORTACATH PLACEMENT N/A 03/24/2014   Procedure: INSERTION PORT-A-CATH;  Surgeon: Excell Seltzer, MD;  Location: [facility];  Service: General;  Laterality: N/A;; removed    THYROIDECTOMY  2000   TONSILLECTOMY AND ADENOIDECTOMY  2000   TOTAL KNEE ARTHROPLASTY Left 12/03/2017   Procedure: LEFT TOTAL KNEE ARTHROPLASTY;  Surgeon: Gaynelle Arabian, MD;  Location: [facility];  Service: Orthopedics;  Laterality: Left;   TUMOR EXCISION     from thoracic spine    Family History  Problem Relation Age of Onset   Alcohol abuse Mother    Arthritis Mother    Hypertension Mother    Bipolar disorder Mother    Breast cancer Mother 79       unconfirmed   Lung cancer Mother 49       smoker   Hyperlipidemia Mother    Stroke Mother    Cancer Mother    Depression Mother    Anxiety disorder Mother    Alcoholism Mother    Drug abuse Mother    Eating disorder Mother    Obesity Mother    Alcohol abuse Father    Hyperlipidemia Father    Kidney disease Father    Diabetes Father    Hypertension Father    Cystic kidney disease Father    Thyroid disease Father    Liver disease Father    Alcoholism Father    Lung cancer Father    Thyroid cancer Sister 38       type?; currently 63   Other Sister        ovarian tumor @ 60; TAH/BSO   Breast cancer Sister    Thyroid cancer Brother        dx 49s; currently 27   Breast cancer Maternal Aunt        dx 69s; deceased 68   Thyroid cancer Paternal 8        All 3 paternal aunts with thyroid ca in 30s/40s   Lung cancer Paternal Aunt        2 of 3 paternal aunts with lung cancer   Arthritis Maternal Grandmother    Diabetes Paternal Grandmother  Heart disease Other    COPD Other    Asthma Other     Social History   Socioeconomic History   Marital status: Married    Spouse name: [name]   Number of children: 2   Years of education: Not on file   Highest education level: Not on  file  Occupational History   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   Social Determinants of Health   Financial Resource Strain: Not on file  Food Insecurity: No Food Insecurity (01/12/2021)   Hunger Vital Sign    Worried About Running Out of Food in the Last Year: Never true    Ran Out of Food in the Last Year: Never true  Transportation Needs: Not on file  Physical Activity: Not on file  Stress: Not on file  Social Connections: Not on file  Intimate Partner Violence: Not on file    Outpatient Medications Prior to Visit  Medication Sig Dispense Refill   albuterol (PROVENTIL) (5 MG/ML) 0.5% nebulizer solution Take 0.5 mLs (2.5 mg total) by nebulization every 6 (six) hours as needed for wheezing or shortness of breath. 40 mL 3   albuterol (VENTOLIN HFA) 108 (90 Base) MCG/ACT inhaler USE 2 INHALATIONS ORALLY   EVERY 6 HOURS AS NEEDED FORWHEEZING OR SHORTNESS OF   BREATH. 54 g 1   amLODipine (NORVASC) 5 MG tablet Take 1 tablet (5 mg total) by mouth daily. 90 tablet 1   anastrozole (ARIMIDEX) 1 MG tablet Take 1 tablet (1 mg total) by mouth daily. 90 tablet 3   CALCIUM PO Take 1 tablet by mouth every evening.     cholecalciferol (VITAMIN D3) 25 MCG (1000 UNIT) tablet Take 1,000 Units by mouth every evening.      clopidogrel (PLAVIX) 75 MG tablet TAKE 1 TABLET EVERY DAY 90 tablet 1   EPINEPHrine 0.3 mg/0.3 mL IJ SOAJ injection Inject 0.3 mg into the muscle as needed for anaphylaxis. 2  each 1   fluticasone-salmeterol (WIXELA INHUB) 250-50 MCG/ACT AEPB Inhale 1 puff into the lungs in the morning and at bedtime. 180 each 6   furosemide (LASIX) 20 MG tablet TAKE 1 TABLET(20 MG) BY MOUTH DAILY AS NEEDED FOR SWELLING 30 tablet 3   gabapentin (NEURONTIN) 100 MG capsule Take 2 capsules (200 mg total) by mouth 3 (three) times daily. 540 capsule 1   GEMTESA 75 MG TABS Take 1 tablet by mouth daily.     latanoprost (XALATAN) 0.005 % ophthalmic solution 1 drop at bedtime.     levothyroxine (SYNTHROID) 150 MCG tablet Take 1 tablet (150 mcg total) by mouth daily. 45 tablet 3   lisinopril (ZESTRIL) 20 MG tablet TAKE 2 TABLETS (40 MG TOTAL) BY MOUTH DAILY. 180 tablet 0   meclizine (ANTIVERT) 25 MG tablet Take 1 tablet (25 mg total) by mouth 3 (three) times daily as needed  for dizziness. 30 tablet 0   metFORMIN (GLUCOPHAGE) 500 MG tablet Take 1 tablet (500 mg total) by mouth 2 (two) times daily with a meal. (Patient taking differently: Take 500 mg by mouth daily with breakfast.) 180 tablet 2   Multiple Vitamin (MULTIVITAMIN ADULT) TABS Take 1 tablet by mouth daily.     Multiple Vitamins-Minerals (OCUVITE PO) Take 1 tablet by mouth daily.     Omega-3 Fatty Acids (FISH OIL) 1000 MG CAPS Take 1 capsule by mouth every evening.      ondansetron (ZOFRAN) 4 MG tablet Take 1 tablet (4 mg total) by mouth every 8 (eight) hours as needed for nausea or vomiting. 20 tablet 0   pantoprazole (PROTONIX) 40 MG tablet TAKE 1 TABLET EVERY DAY 90 tablet 1   rosuvastatin (CRESTOR) 5 MG tablet Take 1 tablet (5 mg total) by mouth daily. 90 tablet 3   Semaglutide, 1 MG/DOSE, 4 MG/3ML SOPN Inject 1 mg as directed once a week. 3 mL 0   methocarbamol (ROBAXIN) 500 MG tablet Take 1 tablet (500 mg total) by mouth 3 (three) times daily. 90 tablet 0   traMADol (ULTRAM) 50 MG tablet Take 1 tablet (50 mg total) by mouth every 8 (eight) hours as needed for moderate pain or severe pain. 21 tablet 0   No facility-administered  medications prior to visit.    Allergies  Allergen Reactions   Bee Venom Anaphylaxis   Betadine [Povidone Iodine] Anaphylaxis    Rash. Breathing problems.    Codeine Shortness Of Breath and Rash   Contrast Media [Iodinated Contrast Media] Shortness Of Breath   Gadolinium Derivatives Hives, Rash, Shortness Of Breath and Swelling    "my heart stopped beating"    Iodine Other (See Comments)    CARDIAC ARREST   Latex Anaphylaxis and Rash   Lidocaine Shortness Of Breath and Swelling    SWELLING OF MOUTH AND THROAT, low BP   Penicillins Shortness Of Breath, Rash and Other (See Comments)    SEIZURE Did it involve swelling of the face/tongue/throat, SOB, or low BP? no Did it involve sudden or severe rash/hives, skin peeling, or any reaction on the inside of your mouth or nose? yes Did you need to seek medical attention at a hospital or doctor's office? yes When did it last happen?      2010 If all above answers are "NO", may proceed with cephalosporin use.    Pentazocine Lactate Shortness Of Breath and Rash   Shellfish Allergy Shortness Of Breath and Rash   Aspirin Rash and Other (See Comments)    GI UPSET   Erythromycin Swelling and Rash    SWELLING OF JOINTS   Symbicort [Budesonide-Formoterol Fumarate] Other (See Comments)    BURNING OF TONGUE AND LIPS   Compazine [Prochlorperazine Maleate] Rash    Rash on face,chest, arms, back   Sulfonamide Derivatives Rash    ROS    See HPI  Objective:    Physical Exam Constitutional:      General: She is not in acute distress.    Appearance: Normal appearance. She is not ill-appearing.  HENT:     Head: Normocephalic and atraumatic.     Right Ear: External ear normal.     Left Ear: External ear normal.  Eyes:     Extraocular Movements: Extraocular movements intact.     Pupils: Pupils are equal, round, and reactive to light.  Cardiovascular:     Rate and Rhythm: Normal rate and regular rhythm.  Heart sounds: Normal heart  sounds. No murmur heard.    No gallop.  Pulmonary:     Effort: Pulmonary effort is normal. No respiratory distress.     Breath sounds: Normal breath sounds. No wheezing or rales.  Skin:    General: Skin is warm and dry.  Neurological:     Mental Status: She is alert and oriented to person, place, and time.  Psychiatric:        Mood and Affect: Mood normal.        Behavior: Behavior normal.        Judgment: Judgment normal.     BP 132/73 (BP Location: Right Arm, Patient Position: Sitting, Cuff Size: Large)   Pulse 98   Temp 97.8 F (36.6 C) (Oral)   Resp 16   Wt 254 lb (115.2 kg)   SpO2 99%   BMI 41.00 kg/m  Wt Readings from Last 3 Encounters:  03/07/22 254 lb (115.2 kg)  03/02/22 249 lb (112.9 kg)  03/01/22 257 lb (116.6 kg)       Assessment & Plan:   Problem List Items Addressed This Visit       Unprioritized   Chronic left-sided low back pain with left-sided sciatica - Primary    Uncontrolled. Rx sent for tramadol prn for short term use as well as robaxin prn.  Reviewed controlled substance registry.       Relevant Medications   traMADol (ULTRAM) 50 MG tablet   methocarbamol (ROBAXIN) 500 MG tablet   Meds ordered this encounter  Medications   traMADol (ULTRAM) 50 MG tablet    Sig: Take 1 tablet (50 mg total) by mouth every 6 (six) hours as needed for moderate pain or severe pain.    Dispense:  20 tablet    Refill:  0    Order Specific Question:   Supervising Provider    Answer:   Penni Homans A [[id number]]   methocarbamol (ROBAXIN) 500 MG tablet    Sig: Take 1 tablet (500 mg total) by mouth every 8 (eight) hours as needed for muscle spasms.    Dispense:  20 tablet    Refill:  0    Order Specific Question:   Supervising Provider    Answer:   Penni Homans A [[id number]]    I, Nance Pear, NP, personally preformed the services described in this documentation.  All medical record entries made by the scribe were at my direction and in my presence.  I have  reviewed the chart and discharge instructions (if applicable) and agree that the record reflects my personal performance and is accurate and complete. 03/07/2022   I,Amber Collins,acting as a scribe for Nance Pear, NP.,have documented all relevant documentation on the behalf of Nance Pear, NP,as directed by  Nance Pear, NP while in the presence of Nance Pear, NP.    Nance Pear, NP

## 2022-03-07 NOTE — Assessment & Plan Note (Signed)
Uncontrolled. Rx sent for tramadol prn for short term use as well as robaxin prn.  Reviewed controlled substance registry.

## 2022-03-08 NOTE — Progress Notes (Unsigned)
Chief Complaint:   OBESITY Michelle Kennedy is here to discuss her progress with her obesity treatment plan along with follow-up of her obesity related diagnoses. Michelle Kennedy is on the Category 4 Plan and states she is following her eating plan approximately 70% of the time. Michelle Kennedy states she is doing senior aerobics and weight lifting for 30 minutes 4 times per week.  Today's visit was #: 10 Starting weight: 275 lbs Starting date: 07/28/2021 Today's weight: 249 lbs Today's date: 03/02/2022 Total lbs lost to date: 26 Total lbs lost since last in-office visit: 4  Interim History: Michelle Kennedy is down 4 pounds since her last visit.  Subjective:   1. Pre-diabetes Michelle Kennedy is taking Ozempic 0.5 mg once weekly, and she notes some appetite suppressant.  2. Mixed hyperlipidemia Michelle Kennedy is taking omega-3 and Crestor.  3. Abdominal pannus Michelle Kennedy has abdominal pannus with weight loss, and she notes irritation.   Assessment/Plan:   1. Pre-diabetes Michelle Kennedy agreed to increase Ozempic to 1 mg once weekly, with no refills.   - Semaglutide, 1 MG/DOSE, 4 MG/3ML SOPN; Inject 1 mg as directed once a week.  Dispense: 3 mL; Refill: 0  2. Mixed hyperlipidemia Michelle Kennedy will continue her medications. She will decrease saturated fats excepts eggs.   3. Abdominal pannus Michelle Kennedy will use supplement, and she will use Monistat OTC if needed and powder.   4. Obesity, current BMI 40.3 Michelle Kennedy is currently in the action stage of change. As such, her goal is to continue with weight loss efforts. She has agreed to the Category 4 Plan.   She will adhere closely to the plan 85-95%.  Exercise goals: As is.   Behavioral modification strategies: increasing lean protein intake, decreasing simple carbohydrates, increasing vegetables, increasing water intake, decreasing eating out, no skipping meals, meal planning and cooking strategies, keeping healthy foods in the home, and planning for success.  Michelle Kennedy has agreed to  follow-up with our clinic in 4 weeks. She was informed of the importance of frequent follow-up visits to maximize her success with intensive lifestyle modifications for her multiple health conditions.   Objective:   Blood pressure 119/71, pulse 69, temperature 97.8 F (36.6 C), height '5\' 6"'$  (1.676 m), weight 249 lb (112.9 kg), SpO2 97 %. Body mass index is 40.19 kg/m.  General: Cooperative, alert, well developed, in no acute distress. HEENT: Conjunctivae and lids unremarkable. Cardiovascular: Regular rhythm.  Lungs: Normal work of breathing. Neurologic: No focal deficits.   Lab Results  Component Value Date   CREATININE 0.75 10/28/2021   BUN 13 10/28/2021   NA 141 10/28/2021   K 4.2 10/28/2021   CL 108 10/28/2021   CO2 27 10/28/2021   Lab Results  Component Value Date   ALT 27 07/20/2021   AST 17 07/20/2021   ALKPHOS 76 07/20/2021   BILITOT 0.4 07/20/2021   Lab Results  Component Value Date   HGBA1C 5.7 (A) 02/02/2022   HGBA1C 5.6 10/28/2021   HGBA1C 5.8 (H) 07/28/2021   HGBA1C 6.0 (A) 04/22/2021   HGBA1C 6.2 10/26/2020   Lab Results  Component Value Date   INSULIN 25.6 (H) 07/28/2021   INSULIN 21.8 09/20/2016   Lab Results  Component Value Date   TSH 0.05 (L) 02/02/2022   Lab Results  Component Value Date   CHOL 187 07/28/2021   HDL 64 07/28/2021   LDLCALC 102 (H) 07/28/2021   LDLDIRECT 117.0 02/26/2019   TRIG 122 07/28/2021   CHOLHDL 3 06/28/2020   Lab Results  Component  Value Date   VD25OH 53.4 07/28/2021   VD25OH 41.4 09/20/2016   Lab Results  Component Value Date   WBC 5.1 07/28/2021   HGB 14.3 07/28/2021   HCT 45.0 07/28/2021   MCV 76 (L) 07/28/2021   PLT 255 07/28/2021   Lab Results  Component Value Date   IRON 71 06/23/2020   TIBC 294 06/23/2020   FERRITIN 94.6 06/23/2020   Attestation Statements:   Reviewed by clinician on day of visit: allergies, medications, problem list, medical history, surgical history, family history, social  history, and previous encounter notes.   Wilhemena Durie, am acting as Location manager for [employer], DO.  I have reviewed the above documentation for accuracy and completeness, and I agree with the above. Jearld Lesch, DO

## 2022-03-09 ENCOUNTER — Encounter: Payer: Self-pay | Admitting: Bariatrics

## 2022-03-16 ENCOUNTER — Ambulatory Visit: Payer: 59 | Admitting: Internal Medicine

## 2022-03-21 ENCOUNTER — Other Ambulatory Visit: Payer: Self-pay | Admitting: Pulmonary Disease

## 2022-03-21 ENCOUNTER — Telehealth: Payer: Self-pay | Admitting: Pulmonary Disease

## 2022-03-21 MED ORDER — NIRMATRELVIR/RITONAVIR (PAXLOVID)TABLET: 3.0000 | ORAL_TABLET | Freq: Two times a day (BID) | ORAL | 0 refills | Status: AC

## 2022-03-21 NOTE — Telephone Encounter (Signed)
Called and spoke with pt who states she tested positive for covid today. Pt's symptoms first began 3 days ago. Pt currently has complaints of a cough which is mainly a dry cough, increased SOB, has a bad headache, and also has a temp of 102.  Pt is wanting to know what we recommend if something could be prescribed.  Routing to Dr. Jenetta Downer since pt has seen him more recently than JD.

## 2022-03-21 NOTE — Progress Notes (Signed)
Paxlovid called in

## 2022-03-21 NOTE — Telephone Encounter (Signed)
Called the pt and there was no answer- LMTCB    

## 2022-03-21 NOTE — Telephone Encounter (Signed)
Over-the-counter cough medicine, medication for headaches should suffice.  Paxlovid can be called in-antiviral

## 2022-03-21 NOTE — Telephone Encounter (Signed)
Patient returned call. Verbally communicated that her prescription for Paxlovid has been called to the pharmacy. Patient states she does not need anything else at this time. But If anything else is needed, please call back [phone]

## 2022-03-23 ENCOUNTER — Ambulatory Visit: Payer: Self-pay | Admitting: Hematology and Oncology

## 2022-03-23 ENCOUNTER — Other Ambulatory Visit: Payer: Medicare Other

## 2022-03-30 ENCOUNTER — Ambulatory Visit (INDEPENDENT_AMBULATORY_CARE_PROVIDER_SITE_OTHER): Payer: Medicare Other | Admitting: Bariatrics

## 2022-03-30 ENCOUNTER — Encounter: Payer: Self-pay | Admitting: Bariatrics

## 2022-03-30 VITALS — BP 115/74 | HR 70 | Temp 97.8°F | Ht 66.0 in | Wt 245.0 lb

## 2022-03-30 DIAGNOSIS — E669 Obesity, unspecified: Secondary | ICD-10-CM

## 2022-03-30 DIAGNOSIS — I10 Essential (primary) hypertension: Secondary | ICD-10-CM | POA: Diagnosis not present

## 2022-03-30 DIAGNOSIS — E7849 Other hyperlipidemia: Secondary | ICD-10-CM | POA: Diagnosis not present

## 2022-03-30 DIAGNOSIS — R7303 Prediabetes: Secondary | ICD-10-CM

## 2022-03-30 DIAGNOSIS — Z6841 Body Mass Index (BMI) 40.0 and over, adult: Secondary | ICD-10-CM

## 2022-03-30 DIAGNOSIS — Z6839 Body mass index (BMI) 39.0-39.9, adult: Secondary | ICD-10-CM

## 2022-03-30 MED ORDER — OZEMPIC (0.25 OR 0.5 MG/DOSE) 2 MG/3ML ~~LOC~~ SOPN: 0.5000 mg | PEN_INJECTOR | SUBCUTANEOUS | 0 refills | Status: DC

## 2022-04-12 ENCOUNTER — Encounter: Payer: Self-pay | Admitting: Bariatrics

## 2022-04-12 NOTE — Progress Notes (Signed)
Chief Complaint:   OBESITY Michelle Kennedy is here to discuss her progress with her obesity treatment plan along with follow-up of her obesity related diagnoses. Michelle Kennedy is on the Category 4 Plan and states she is following her eating plan approximately 70% of the time. Michelle Kennedy states she is doing 0 minutes 0 times per week.  Today's visit was #: 11 Starting weight: 275 lbs Starting date: 07/28/2021 Today's weight: 245 lbs Today's date: 03/30/2022 Total lbs lost to date: 30 Total lbs lost since last in-office visit: 4  Interim History: Michelle Kennedy is down an additional 4 pounds since her last visit.  She is eating high protein and low carbohydrates.  Subjective:   1. Other hyperlipidemia Michelle Kennedy is currently taking Zocor.  2. Essential hypertension Michelle Kennedy's blood pressure is well controlled.  3. Pre-diabetes Michelle Kennedy had stopped Ozempic before her trip.  Assessment/Plan:   1. Other hyperlipidemia Michelle Kennedy will continue Zocor as directed.  2. Essential hypertension Michelle Kennedy will continue her medications as directed.  We will follow-up at her next visit.  3. Pre-diabetes Michelle Kennedy agreed to decrease Ozempic from 1 mg to 0.5 mg once weekly, and we will refill for 1 month.  - Semaglutide,0.25 or 0.'5MG'$ /DOS, (OZEMPIC, 0.25 OR 0.5 MG/DOSE,) 2 MG/3ML SOPN; Inject 0.5 mg into the skin once a week.  Dispense: 3 mL; Refill: 0  4. Obesity, current BMI 39.6 Michelle Kennedy is currently in the action stage of change. As such, her goal is to continue with weight loss efforts. She has agreed to the Category 3 Plan.   She will adhere closely to the plan.  Mindful eating was discussed.  Exercise goals: No exercise has been prescribed at this time.  Behavioral modification strategies: increasing lean protein intake, decreasing simple carbohydrates, increasing vegetables, increasing water intake, decreasing eating out, no skipping meals, meal planning and cooking strategies, and keeping healthy foods in the  home.  Michelle Kennedy has agreed to follow-up with our clinic in 4 weeks. She was informed of the importance of frequent follow-up visits to maximize her success with intensive lifestyle modifications for her multiple health conditions.   Objective:   Blood pressure 115/74, pulse 70, temperature 97.8 F (36.6 C), height '5\' 6"'$  (1.676 m), weight 245 lb (111.1 kg), SpO2 96 %. Body mass index is 39.54 kg/m.  General: Cooperative, alert, well developed, in no acute distress. HEENT: Conjunctivae and lids unremarkable. Cardiovascular: Regular rhythm.  Lungs: Normal work of breathing. Neurologic: No focal deficits.   Lab Results  Component Value Date   CREATININE 0.75 10/28/2021   BUN 13 10/28/2021   NA 141 10/28/2021   K 4.2 10/28/2021   CL 108 10/28/2021   CO2 27 10/28/2021   Lab Results  Component Value Date   ALT 27 07/20/2021   AST 17 07/20/2021   ALKPHOS 76 07/20/2021   BILITOT 0.4 07/20/2021   Lab Results  Component Value Date   HGBA1C 5.7 (A) 02/02/2022   HGBA1C 5.6 10/28/2021   HGBA1C 5.8 (H) 07/28/2021   HGBA1C 6.0 (A) 04/22/2021   HGBA1C 6.2 10/26/2020   Lab Results  Component Value Date   INSULIN 25.6 (H) 07/28/2021   INSULIN 21.8 09/20/2016   Lab Results  Component Value Date   TSH 0.05 (L) 02/02/2022   Lab Results  Component Value Date   CHOL 187 07/28/2021   HDL 64 07/28/2021   LDLCALC 102 (H) 07/28/2021   LDLDIRECT 117.0 02/26/2019   TRIG 122 07/28/2021   CHOLHDL 3 06/28/2020   Lab Results  Component Value Date   VD25OH 53.4 07/28/2021   VD25OH 41.4 09/20/2016   Lab Results  Component Value Date   WBC 5.1 07/28/2021   HGB 14.3 07/28/2021   HCT 45.0 07/28/2021   MCV 76 (L) 07/28/2021   PLT 255 07/28/2021   Lab Results  Component Value Date   IRON 71 06/23/2020   TIBC 294 06/23/2020   FERRITIN 94.6 06/23/2020   Attestation Statements:   Reviewed by clinician on day of visit: allergies, medications, problem list, medical history, surgical  history, family history, social history, and previous encounter notes.   Wilhemena Durie, am acting as Location manager for [employer], DO.  I have reviewed the above documentation for accuracy and completeness, and I agree with the above. Jearld Lesch, DO

## 2022-04-20 ENCOUNTER — Telehealth: Payer: Self-pay

## 2022-04-20 ENCOUNTER — Telehealth: Payer: Self-pay | Admitting: Family

## 2022-04-20 NOTE — Telephone Encounter (Signed)
Information given to patient's husband about a VV tomorrow at 1 pm

## 2022-04-20 NOTE — Telephone Encounter (Signed)
VOB submitted for Orthovisc, right knee

## 2022-04-20 NOTE — Telephone Encounter (Signed)
Patient called to advise that she is still experiencing covid symptoms 5 weeks later and does not seem to be improving. Patient says she is still experiencing fatigue, headaches, sinus issues, runny nose, and a mild cough. Patient said her blood pressure is normal and her O2 levels range between 92-98 with 94 being normal for her. She said she does not feel up to coming in so would like a message to be sent back first to see if there are any suggestions for her. She is curious if she has long covid.

## 2022-04-21 ENCOUNTER — Telehealth (INDEPENDENT_AMBULATORY_CARE_PROVIDER_SITE_OTHER): Payer: Medicare Other | Admitting: Family

## 2022-04-21 ENCOUNTER — Ambulatory Visit (HOSPITAL_BASED_OUTPATIENT_CLINIC_OR_DEPARTMENT_OTHER)
Admission: RE | Admit: 2022-04-21 | Discharge: 2022-04-21 | Disposition: A | Discharge status: Home / Self Care | Payer: Medicare Other | Source: Ambulatory Visit | Attending: Family | Admitting: Family

## 2022-04-21 VITALS — BP 120/90 | Temp 96.1°F

## 2022-04-21 DIAGNOSIS — U099 Post covid-19 condition, unspecified: Secondary | ICD-10-CM | POA: Insufficient documentation

## 2022-04-21 MED ORDER — PREDNISONE 10 MG PO TABS: ORAL_TABLET | ORAL | 0 refills | Status: DC

## 2022-04-21 NOTE — Progress Notes (Signed)
MyChart Video Visit    Virtual Visit via Video Note   This visit type was conducted due to national recommendations for restrictions regarding the COVID-19 Pandemic (e.g. social distancing) in an effort to limit this patient's exposure and mitigate transmission in our community. This patient is at least at moderate risk for complications without adequate follow up. This format is felt to be most appropriate for this patient at this time. Physical exam was limited by quality of the video and audio technology used for the visit. CMA was able to get the patient set up on a video visit.  Patient location: Home. Patient and provider in visit Provider location: Office  I discussed the limitations of evaluation and management by telemedicine and the availability of in person appointments. The patient expressed understanding and agreed to proceed.  Visit Date: 04/21/2022  Today's healthcare provider: Nance Pear, NP     Subjective:    Patient ID: Michelle Kennedy, female    DOB: 02-05-1956, 66 y.o.   MRN: [id number]  Chief Complaint  Patient presents with   URI    HPI  Patient presents today with chief complaint of fatigue. "I just feel like I have been hit by a truck." She was diagnosed with Covid-19 on 03/20/2022. She has not sought medical care for her COVID-19 infection until now except for a call to her pulmonologist who prescribed paxlovid. She is taking mucinex and tylenol. She denies having any chest congestion. She has mild wheezing/cough. Her temperature is 96.1 degrees F. She has albuterol at home is and willing to use it if her symptoms worsen.   Past Medical History:  Diagnosis Date   Abnormally small mouth    Achilles tendon disorder, right    Anemia    Anxiety    Arthritis    hips and knees   Asthma    daily inhaler, prn inhaler and neb.   Asthma due to environmental allergies    Breast cancer ([place]) 01/2014   left   Chest pain    CHF (congestive  heart failure) (HCC)    COPD (chronic obstructive pulmonary disease) (HCC)    Dental bridge present    upper front and lower right   Dental crowns present    x 3   Depression    Esophageal spasm    reports since her chemo and breast surgery  she developed esophageal spasms and reports this is in the past has casue her 02 to desat in the  70s , denies sycnope in relation to this , does report hx of vertigo as well    Family history of anesthesia complication    twin brother aspirated and died on OR table, per pt.   Fibromyalgia    Gallbladder disease    Gallstones    GERD (gastroesophageal reflux disease)    Glaucoma    H/O blood clots    had blood to  PICC line    History of gastric ulcer    as a teenager   History of seizure age 67   as a reaction to Penicillin - no seizures since   History of stomach ulcers    History of thyroid cancer    s/p thyroidectomy   HTN (hypertension)    Hyperlipidemia    Hypothyroidism    Joint pain    Left knee injury    Liver disorder    seen when she had her hysterectomy , reports she was told it was  "  small lesion" ; but asymptomatic    Lymphedema    left arm   Migraines    Multiple food allergies    Obesity    OSA (obstructive sleep apnea) 02/16/2016   CPAP   Palpitations    reports no longer experiences   Personal history of chemotherapy 2015   Personal history of radiation therapy 2015   Left   Prediabetes    Rheumatoid arthritis (HCC)    SOB (shortness of breath)    Swelling of both ankles    Tinnitus    UTI (lower urinary tract infection) 02/17/2014   Vertigo    last episode  was 2 weeks ago    Vitamin D deficiency    Wears contact lenses    left eye only    Wears hearing aid in both ears     Past Surgical History:  Procedure Laterality Date   ABDOMINAL HYSTERECTOMY  2004   complete   ACHILLES TENDON REPAIR Right    APPENDECTOMY  2004   BREAST EXCISIONAL BIOPSY     BREAST LUMPECTOMY Left    2015   BREAST  LUMPECTOMY WITH NEEDLE LOCALIZATION AND AXILLARY SENTINEL LYMPH NODE BX Left 02/23/2014   Procedure: BREAST LUMPECTOMY WITH NEEDLE LOCALIZATION AND AXILLARY SENTINEL LYMPH NODE BIOPSY;  Surgeon: Excell Seltzer, MD;  Location: [facility];  Service: General;  Laterality: Left;   BREAST SURGERY  2011   left breast biposy   CHOLECYSTECTOMY  1990   COLONOSCOPY W/ POLYPECTOMY  06/2009   EYE SURGERY Right 2013   exc. warts from underneath eyelid   INCONTINENCE SURGERY  2004   KNEE ARTHROSCOPY Bilateral    x 6 each knee   LIGAMENT REPAIR Right    thumb/wrist   ORIF TOE FRACTURE Right    great toe   PORTACATH PLACEMENT N/A 03/24/2014   Procedure: INSERTION PORT-A-CATH;  Surgeon: Excell Seltzer, MD;  Location: [facility];  Service: General;  Laterality: N/A;; removed    THYROIDECTOMY  2000   TONSILLECTOMY AND ADENOIDECTOMY  2000   TOTAL KNEE ARTHROPLASTY Left 12/03/2017   Procedure: LEFT TOTAL KNEE ARTHROPLASTY;  Surgeon: Gaynelle Arabian, MD;  Location: [facility];  Service: Orthopedics;  Laterality: Left;   TUMOR EXCISION     from thoracic spine    Family History  Problem Relation Age of Onset   Alcohol abuse Mother    Arthritis Mother    Hypertension Mother    Bipolar disorder Mother    Breast cancer Mother 70       unconfirmed   Lung cancer Mother 54       smoker   Hyperlipidemia Mother    Stroke Mother    Cancer Mother    Depression Mother    Anxiety disorder Mother    Alcoholism Mother    Drug abuse Mother    Eating disorder Mother    Obesity Mother    Alcohol abuse Father    Hyperlipidemia Father    Kidney disease Father    Diabetes Father    Hypertension Father    Cystic kidney disease Father    Thyroid disease Father    Liver disease Father    Alcoholism Father    Lung cancer Father    Thyroid cancer Sister 18       type?; currently 64   Other Sister        ovarian tumor @ 75; TAH/BSO   Breast cancer Sister    Thyroid cancer Brother  dx 61s; currently 58   Breast cancer Maternal Aunt        dx 47s; deceased 77   Thyroid cancer Paternal 40        All 3 paternal aunts with thyroid ca in 30s/40s   Lung cancer Paternal Aunt        2 of 3 paternal aunts with lung cancer   Arthritis Maternal Grandmother    Diabetes Paternal Grandmother    Heart disease Other    COPD Other    Asthma Other     Social History   Socioeconomic History   Marital status: Married    Spouse name: [name]   Number of children: 2   Years of education: Not on file   Highest education level: Not on file  Occupational History   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   Social Determinants of Health   Financial Resource Strain: Not on file  Food Insecurity: No Food Insecurity (01/12/2021)   Hunger Vital Sign    Worried About Running Out of Food in the Last Year: Never true    Ran Out of Food in the Last Year: Never true  Transportation Needs: Not on file  Physical Activity: Not on file  Stress: Not on file  Social Connections: Not on file  Intimate Partner Violence: Not on file    Outpatient Medications Prior to Visit  Medication Sig Dispense Refill   albuterol (PROVENTIL) (5 MG/ML) 0.5% nebulizer solution Take 0.5 mLs (2.5 mg total) by nebulization every 6 (six) hours as needed for wheezing or shortness of breath. 40 mL 3   albuterol (VENTOLIN HFA) 108 (90 Base) MCG/ACT inhaler USE 2 INHALATIONS ORALLY   EVERY 6 HOURS AS  NEEDED FORWHEEZING OR SHORTNESS OF   BREATH. 54 g 1   amLODipine (NORVASC) 5 MG tablet Take 1 tablet (5 mg total) by mouth daily. 90 tablet 1   anastrozole (ARIMIDEX) 1 MG tablet Take 1 tablet (1 mg total) by mouth daily. 90 tablet 3   CALCIUM PO Take 1 tablet by mouth every evening.     cholecalciferol (VITAMIN D3) 25 MCG (1000 UNIT) tablet Take 1,000 Units by mouth every evening.      clopidogrel (PLAVIX) 75 MG tablet TAKE 1 TABLET EVERY DAY 90 tablet 1   EPINEPHrine 0.3 mg/0.3 mL IJ SOAJ injection Inject 0.3 mg into the muscle as needed for anaphylaxis. 2 each 1   fluticasone-salmeterol (WIXELA INHUB) 250-50 MCG/ACT AEPB Inhale 1 puff into the lungs in the morning and at bedtime. 180 each 6   furosemide (LASIX) 20 MG tablet TAKE 1 TABLET(20 MG) BY MOUTH DAILY AS NEEDED FOR SWELLING 30 tablet 3   gabapentin (NEURONTIN) 100 MG capsule Take 2 capsules (200 mg total) by mouth 3 (three) times daily. 540 capsule 1   GEMTESA 75 MG TABS Take 1 tablet by  mouth daily.     latanoprost (XALATAN) 0.005 % ophthalmic solution 1 drop at bedtime.     levothyroxine (SYNTHROID) 150 MCG tablet Take 1 tablet (150 mcg total) by mouth daily. 45 tablet 3   lisinopril (ZESTRIL) 20 MG tablet TAKE 2 TABLETS (40 MG TOTAL) BY MOUTH DAILY. 180 tablet 0   meclizine (ANTIVERT) 25 MG tablet Take 1 tablet (25 mg total) by mouth 3 (three) times daily as needed for dizziness. 30 tablet 0   metFORMIN (GLUCOPHAGE) 500 MG tablet Take 1 tablet (500 mg total) by mouth 2 (two) times daily with a meal. (Patient taking differently: Take 500 mg by mouth daily with breakfast.) 180 tablet 2   methocarbamol (ROBAXIN) 500 MG tablet Take 1 tablet (500 mg total) by mouth every 8 (eight) hours as needed for muscle spasms. 20 tablet 0   Multiple Vitamin (MULTIVITAMIN ADULT) TABS Take 1 tablet by mouth daily.     Multiple Vitamins-Minerals (OCUVITE PO) Take 1 tablet by mouth daily.     Omega-3 Fatty Acids (FISH OIL) 1000 MG CAPS Take 1 capsule by  mouth every evening.      ondansetron (ZOFRAN) 4 MG tablet Take 1 tablet (4 mg total) by mouth every 8 (eight) hours as needed for nausea or vomiting. 20 tablet 0   pantoprazole (PROTONIX) 40 MG tablet TAKE 1 TABLET EVERY DAY 90 tablet 1   rosuvastatin (CRESTOR) 5 MG tablet Take 1 tablet (5 mg total) by mouth daily. 90 tablet 3   Semaglutide,0.25 or 0.'5MG'$ /DOS, (OZEMPIC, 0.25 OR 0.5 MG/DOSE,) 2 MG/3ML SOPN Inject 0.5 mg into the skin once a week. 3 mL 0   traMADol (ULTRAM) 50 MG tablet Take 1 tablet (50 mg total) by mouth every 6 (six) hours as needed for moderate pain or severe pain. 20 tablet 0   No facility-administered medications prior to visit.    Allergies  Allergen Reactions   Bee Venom Anaphylaxis   Betadine [Povidone Iodine] Anaphylaxis    Rash. Breathing problems.    Codeine Shortness Of Breath and Rash   Contrast Media [Iodinated Contrast Media] Shortness Of Breath   Gadolinium Derivatives Hives, Rash, Shortness Of Breath and Swelling    "my heart stopped beating"    Iodine Other (See Comments)    CARDIAC ARREST   Latex Anaphylaxis and Rash   Lidocaine Shortness Of Breath and Swelling    SWELLING OF MOUTH AND THROAT, low BP   Penicillins Shortness Of Breath, Rash and Other (See Comments)    SEIZURE Did it involve swelling of the face/tongue/throat, SOB, or low BP? no Did it involve sudden or severe rash/hives, skin peeling, or any reaction on the inside of your mouth or nose? yes Did you need to seek medical attention at a hospital or doctor's office? yes When did it last happen?      2010 If all above answers are "NO", may proceed with cephalosporin use.    Pentazocine Lactate Shortness Of Breath and Rash   Shellfish Allergy Shortness Of Breath and Rash   Aspirin Rash and Other (See Comments)    GI UPSET   Erythromycin Swelling and Rash    SWELLING OF JOINTS   Symbicort [Budesonide-Formoterol Fumarate] Other (See Comments)    BURNING OF TONGUE AND LIPS    Compazine [Prochlorperazine Maleate] Rash    Rash on face,chest, arms, back   Sulfonamide Derivatives Rash    ROS    See HPI Objective:    Physical Exam  BP (!) 120/90  Temp (!) 96.1 F (35.6 C)   SpO2 93%  Wt Readings from Last 3 Encounters:  03/30/22 245 lb (111.1 kg)  03/07/22 254 lb (115.2 kg)  03/02/22 249 lb (112.9 kg)   Gen: Awake, alert, no acute distress Resp: Breathing is even and non-labored Psych: calm/pleasant demeanor Neuro: Alert and Oriented x 3, + facial symmetry, speech is clear.      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  Discussed that fatigue can be common post covid-19. Encouraged rest/hydration. Will rx with prednisone taper and albuterol for wheezing. If symptoms worsen or if they fail to improve in the next 1 week, recommended that she schedule an in office visit. Pt verbalizes understanding.       Relevant Orders   DG Chest 2 View    I am having Maple Odaniel. [employer]" start on predniSONE. I am also having her maintain her CALCIUM PO, cholecalciferol, Fish Oil, Multivitamin Adult, Multiple Vitamins-Minerals (OCUVITE PO), Gemtesa, albuterol, latanoprost, metFORMIN, gabapentin, furosemide, anastrozole, rosuvastatin, ondansetron, pantoprazole, albuterol, EPINEPHrine, lisinopril, clopidogrel, fluticasone-salmeterol, meclizine, levothyroxine, amLODipine, traMADol, methocarbamol, and Ozempic (0.25 or 0.5 MG/DOSE).  Meds ordered this encounter  Medications   predniSONE (DELTASONE) 10 MG tablet    Sig: 4 tabs by mouth once daily for 2 days, then 3 tabs daily x 2 days, then 2 tabs daily x 2 days, then 1 tab daily x 2 days    Dispense:  20 tablet    Refill:  0    Order Specific Question:   Supervising Provider    Answer:   Penni Homans A [[id number]]    I discussed the assessment and treatment  plan with the patient. The patient was provided an opportunity to ask questions and all were answered. The patient agreed with the plan and demonstrated an understanding of the instructions.   The patient was advised to call back or seek an in-person evaluation if the symptoms worsen or if the condition fails to improve as anticipated.   Nance Pear, NP [employer] [phone] (phone) [phone] (fax)  [place]

## 2022-04-21 NOTE — Progress Notes (Deleted)
Pt was called by cma and provider. No answer. Did not log on to video visit.  Left message on voicemail advising pt to call to reschedule her appointment if her symptoms are not improved.

## 2022-04-21 NOTE — Addendum Note (Signed)
Addended by: Debbrah Alar on: 04/21/2022 01:54 PM   Modules accepted: Orders, Level of Service

## 2022-04-21 NOTE — Assessment & Plan Note (Signed)
Given c/o wheezing, cough, and hx of PNA, will obtain CXR for further evaluation.  Discussed that fatigue can be common post covid-19. Encouraged rest/hydration. Will rx with prednisone taper and albuterol for wheezing. If symptoms worsen or if they fail to improve in the next 1 week, recommended that she schedule an in office visit. Pt verbalizes understanding.

## 2022-04-27 ENCOUNTER — Ambulatory Visit (INDEPENDENT_AMBULATORY_CARE_PROVIDER_SITE_OTHER): Payer: Medicare Other | Admitting: Bariatrics

## 2022-04-27 ENCOUNTER — Encounter: Payer: Self-pay | Admitting: Bariatrics

## 2022-04-27 VITALS — BP 119/79 | HR 72 | Temp 97.5°F | Ht 66.0 in | Wt 250.0 lb

## 2022-04-27 DIAGNOSIS — Z6841 Body Mass Index (BMI) 40.0 and over, adult: Secondary | ICD-10-CM

## 2022-04-27 DIAGNOSIS — R632 Polyphagia: Secondary | ICD-10-CM | POA: Diagnosis not present

## 2022-04-27 DIAGNOSIS — R7303 Prediabetes: Secondary | ICD-10-CM | POA: Diagnosis not present

## 2022-04-27 DIAGNOSIS — E669 Obesity, unspecified: Secondary | ICD-10-CM | POA: Diagnosis not present

## 2022-04-27 MED ORDER — SEMAGLUTIDE (1 MG/DOSE) 4 MG/3ML ~~LOC~~ SOPN: 1.0000 mg | PEN_INJECTOR | SUBCUTANEOUS | 0 refills | Status: DC

## 2022-04-30 ENCOUNTER — Other Ambulatory Visit: Payer: Self-pay | Admitting: Family

## 2022-05-01 ENCOUNTER — Ambulatory Visit: Payer: Medicare Other | Admitting: Sports Medicine

## 2022-05-01 ENCOUNTER — Telehealth: Payer: Self-pay | Admitting: Family

## 2022-05-01 ENCOUNTER — Ambulatory Visit (INDEPENDENT_AMBULATORY_CARE_PROVIDER_SITE_OTHER): Payer: Medicare Other | Admitting: Family Medicine

## 2022-05-01 VITALS — BP 138/86 | Ht 66.0 in | Wt 245.0 lb

## 2022-05-01 DIAGNOSIS — M1711 Unilateral primary osteoarthritis, right knee: Secondary | ICD-10-CM | POA: Diagnosis present

## 2022-05-01 NOTE — Telephone Encounter (Signed)
Patient dropped off document to be filled out by provider DMV. Patient requested to send it via Call Patient to pick up within 7-days. Document is located in providers tray at front office.

## 2022-05-01 NOTE — Assessment & Plan Note (Signed)
Acute on chronic in nature.  Exacerbation of underlying degenerative changes. -Counseled on home exercise therapy and supportive care. - provided brace - pursue gel injection

## 2022-05-01 NOTE — Progress Notes (Addendum)
Michelle Kennedy - 66 y.o. female MRN [id number]  Date of birth: 12/05/1955  SUBJECTIVE:  Including CC & ROS.  No chief complaint on file.   Michelle Kennedy is a 66 y.o. female that is following up for her right knee pain.  She did well on her recent trip.  Having some anterior medial sided knee pain.   Review of Systems See HPI   HISTORY: Past Medical, Surgical, Social, and Family History Reviewed & Updated per EMR.   Pertinent Historical Findings include:  Past Medical History:  Diagnosis Date   Abnormally small mouth    Achilles tendon disorder, right    Anemia    Anxiety    Arthritis    hips and knees   Asthma    daily inhaler, prn inhaler and neb.   Asthma due to environmental allergies    Breast cancer ([place]) 01/2014   left   Chest pain    CHF (congestive heart failure) (HCC)    COPD (chronic obstructive pulmonary disease) (HCC)    Dental bridge present    upper front and lower right   Dental crowns present    x 3   Depression    Esophageal spasm    reports since her chemo and breast surgery  she developed esophageal spasms and reports this is in the past has casue her 02 to desat in the  70s , denies sycnope in relation to this , does report hx of vertigo as well    Family history of anesthesia complication    twin brother aspirated and died on OR table, per pt.   Fibromyalgia    Gallbladder disease    Gallstones    GERD (gastroesophageal reflux disease)    Glaucoma    H/O blood clots    had blood to  PICC line    History of gastric ulcer    as a teenager   History of seizure age 24   as a reaction to Penicillin - no seizures since   History of stomach ulcers    History of thyroid cancer    s/p thyroidectomy   HTN (hypertension)    Hyperlipidemia    Hypothyroidism    Joint pain    Left knee injury    Liver disorder    seen when she had her hysterectomy , reports she was told it was  " small lesion" ; but asymptomatic    Lymphedema    left arm    Migraines    Multiple food allergies    Obesity    OSA (obstructive sleep apnea) 02/16/2016   CPAP   Palpitations    reports no longer experiences   Personal history of chemotherapy 2015   Personal history of radiation therapy 2015   Left   Prediabetes    Rheumatoid arthritis (HCC)    SOB (shortness of breath)    Swelling of both ankles    Tinnitus    UTI (lower urinary tract infection) 02/17/2014   Vertigo    last episode  was 2 weeks ago    Vitamin D deficiency    Wears contact lenses    left eye only    Wears hearing aid in both ears     Past Surgical History:  Procedure Laterality Date   ABDOMINAL HYSTERECTOMY  2004   complete   ACHILLES TENDON REPAIR Right    APPENDECTOMY  2004   BREAST EXCISIONAL BIOPSY     BREAST LUMPECTOMY Left  2015   BREAST LUMPECTOMY WITH NEEDLE LOCALIZATION AND AXILLARY SENTINEL LYMPH NODE BX Left 02/23/2014   Procedure: BREAST LUMPECTOMY WITH NEEDLE LOCALIZATION AND AXILLARY SENTINEL LYMPH NODE BIOPSY;  Surgeon: Excell Seltzer, MD;  Location: [facility];  Service: General;  Laterality: Left;   BREAST SURGERY  2011   left breast biposy   CHOLECYSTECTOMY  1990   COLONOSCOPY W/ POLYPECTOMY  06/2009   EYE SURGERY Right 2013   exc. warts from underneath eyelid   INCONTINENCE SURGERY  2004   KNEE ARTHROSCOPY Bilateral    x 6 each knee   LIGAMENT REPAIR Right    thumb/wrist   ORIF TOE FRACTURE Right    great toe   PORTACATH PLACEMENT N/A 03/24/2014   Procedure: INSERTION PORT-A-CATH;  Surgeon: Excell Seltzer, MD;  Location: [facility];  Service: General;  Laterality: N/A;; removed    THYROIDECTOMY  2000   TONSILLECTOMY AND ADENOIDECTOMY  2000   TOTAL KNEE ARTHROPLASTY Left 12/03/2017   Procedure: LEFT TOTAL KNEE ARTHROPLASTY;  Surgeon: Gaynelle Arabian, MD;  [facility];  Service: Orthopedics;  Laterality: Left;   TUMOR EXCISION     from thoracic spine     PHYSICAL EXAM:  VS: BP 138/86   Ht '5\' 6"'$   (1.676 m)   Wt 245 lb (111.1 kg)   BMI 39.54 kg/m  Physical Exam Gen: NAD, alert, cooperative with exam, well-appearing MSK:  Right knee:  Patient is ambulatory  Neurovascularly intact       ASSESSMENT & PLAN:   OA (osteoarthritis) of knee Acute on chronic in nature.  Exacerbation of underlying degenerative changes. -Counseled on home exercise therapy and supportive care. - provided brace - pursue gel injection

## 2022-05-02 ENCOUNTER — Telehealth: Payer: Self-pay

## 2022-05-02 NOTE — Telephone Encounter (Signed)
Received patients benefits from [employer].   Deductible has been met, patient is responsible for a coinsurance. Prior authorization is not required.  Office visit Copay/coinsurance 20%  Secondary plan follows medicare guidelines. It will pickup remaining eligible expenses at 100% after copay. It does not cover the Medicare Part B deductible.   Called patient to inform about above, no answer or voicemail set up.

## 2022-05-08 ENCOUNTER — Encounter: Payer: Self-pay | Admitting: Family

## 2022-05-08 ENCOUNTER — Ambulatory Visit (INDEPENDENT_AMBULATORY_CARE_PROVIDER_SITE_OTHER): Payer: Medicare Other | Admitting: Family

## 2022-05-08 ENCOUNTER — Ambulatory Visit: Payer: Medicare Other | Admitting: Family Medicine

## 2022-05-08 VITALS — BP 138/74 | HR 72 | Temp 98.1°F | Resp 16 | Wt 255.0 lb

## 2022-05-08 DIAGNOSIS — J40 Bronchitis, not specified as acute or chronic: Secondary | ICD-10-CM | POA: Diagnosis not present

## 2022-05-08 MED ORDER — DOXYCYCLINE HYCLATE 100 MG PO TABS: 100.0000 mg | ORAL_TABLET | Freq: Two times a day (BID) | ORAL | 0 refills | Status: DC

## 2022-05-08 NOTE — Progress Notes (Signed)
Subjective:   By signing my name below, I, Michelle Kennedy, attest that this documentation has been prepared under the direction and in the presence of Debbrah Alar, 05/08/2022.   Patient ID: Michelle Kennedy, female    DOB: [birth year], 66 y.o.   MRN: [id number]  Chief Complaint  Patient presents with   Shortness of Breath    Complains of shortness of breath wit pain when breathing    HPI Patient is in today for an office visit.  SOB Patient is complaining of SOB with pain when breathing starting the second week of October. She reports that on that Saturday around October 26th, she was diagnosed with Covid and was sick for a couple of weeks. She then got better for a few days and is worsening now. Patient expresses that she finished 10 mg prednisone taper treatment after a virtual visit about a week or so ago with relief temporary relief. She explains that she has used her rescue inhaler and then her nebulizer if she still feels out of breath. Patient reports that she vomited mucous, but still feels mucous moving in her chest. She denies heartburn symptoms. Overall respiratory symptoms seem to be worsening.   Health Maintenance Due  Topic Date Due   Medicare Annual Wellness (AWV)  Never done   COVID-19 Vaccine (8 - 2023-24 season) 01/20/2022    Past Medical History:  Diagnosis Date   Abnormally small mouth    Achilles tendon disorder, right    Anemia    Anxiety    Arthritis    hips and knees   Asthma    daily inhaler, prn inhaler and neb.   Asthma due to environmental allergies    Breast cancer ([place]) 01/2014   left   Chest pain    CHF (congestive heart failure) (HCC)    COPD (chronic obstructive pulmonary disease) (HCC)    Dental bridge present    upper front and lower right   Dental crowns present    x 3   Depression    Esophageal spasm    reports since her chemo and breast surgery  she developed esophageal spasms and reports this is in the past has casue her 02 to  desat in the  70s , denies sycnope in relation to this , does report hx of vertigo as well    Family history of anesthesia complication    twin brother aspirated and died on OR table, per pt.   Fibromyalgia    Gallbladder disease    Gallstones    GERD (gastroesophageal reflux disease)    Glaucoma    H/O blood clots    had blood to  PICC line    History of gastric ulcer    as a teenager   History of seizure age 26   as a reaction to Penicillin - no seizures since   History of stomach ulcers    History of thyroid cancer    s/p thyroidectomy   HTN (hypertension)    Hyperlipidemia    Hypothyroidism    Joint pain    Left knee injury    Liver disorder    seen when she had her hysterectomy , reports she was told it was  " small lesion" ; but asymptomatic    Lymphedema    left arm   Migraines    Multiple food allergies    Obesity    OSA (obstructive sleep apnea) 02/16/2016   CPAP   Palpitations    reports  no longer experiences   Personal history of chemotherapy 2015   Personal history of radiation therapy 2015   Left   Prediabetes    Rheumatoid arthritis ([place])    SOB (shortness of breath)    Swelling of both ankles    Tinnitus    UTI (lower urinary tract infection) 02/17/2014   Vertigo    last episode  was 2 weeks ago    Vitamin D deficiency    Wears contact lenses    left eye only    Wears hearing aid in both ears     Past Surgical History:  Procedure Laterality Date   ABDOMINAL HYSTERECTOMY  2004   complete   ACHILLES TENDON REPAIR Right    APPENDECTOMY  2004   BREAST EXCISIONAL BIOPSY     BREAST LUMPECTOMY Left    2015   BREAST LUMPECTOMY WITH NEEDLE LOCALIZATION AND AXILLARY SENTINEL LYMPH NODE BX Left 02/23/2014   Procedure: BREAST LUMPECTOMY WITH NEEDLE LOCALIZATION AND AXILLARY SENTINEL LYMPH NODE BIOPSY;  Surgeon: Excell Seltzer, MD;  Location: [facility];  Service: General;  Laterality: Left;   BREAST SURGERY  2011   left breast biposy    CHOLECYSTECTOMY  1990   COLONOSCOPY W/ POLYPECTOMY  06/2009   EYE SURGERY Right 2013   exc. warts from underneath eyelid   INCONTINENCE SURGERY  2004   KNEE ARTHROSCOPY Bilateral    x 6 each knee   LIGAMENT REPAIR Right    thumb/wrist   ORIF TOE FRACTURE Right    great toe   PORTACATH PLACEMENT N/A 03/24/2014   Procedure: INSERTION PORT-A-CATH;  Surgeon: Excell Seltzer, MD;  Location: [facility];  Service: General;  Laterality: N/A;; removed    THYROIDECTOMY  2000   TONSILLECTOMY AND ADENOIDECTOMY  2000   TOTAL KNEE ARTHROPLASTY Left 12/03/2017   Procedure: LEFT TOTAL KNEE ARTHROPLASTY;  Surgeon: Gaynelle Arabian, MD;  Location: [facility];  Service: Orthopedics;  Laterality: Left;   TUMOR EXCISION     from thoracic spine    Family History  Problem Relation Age of Onset   Alcohol abuse Mother    Arthritis Mother    Hypertension Mother    Bipolar disorder Mother    Breast cancer Mother 31       unconfirmed   Lung cancer Mother 12       smoker   Hyperlipidemia Mother    Stroke Mother    Cancer Mother    Depression Mother    Anxiety disorder Mother    Alcoholism Mother    Drug abuse Mother    Eating disorder Mother    Obesity Mother    Alcohol abuse Father    Hyperlipidemia Father    Kidney disease Father    Diabetes Father    Hypertension Father    Cystic kidney disease Father    Thyroid disease Father    Liver disease Father    Alcoholism Father    Lung cancer Father    Thyroid cancer Sister 74       type?; currently 8   Other Sister        ovarian tumor @ 69; TAH/BSO   Breast cancer Sister    Thyroid cancer Brother        dx 68s; currently 64   Breast cancer Maternal Aunt        dx 45s; deceased 69   Thyroid cancer Paternal Aunt        All 3 paternal aunts with  thyroid ca in 30s/40s   Lung cancer Paternal Aunt        2 of 3 paternal aunts with lung cancer   Arthritis Maternal Grandmother    Diabetes Paternal Grandmother    Heart disease  Other    COPD Other    Asthma Other     Social History   Socioeconomic History   Marital status: Married    Spouse name: [name]   Number of children: 2   Years of education: Not on file   Highest education level: Not on file  Occupational History   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   Social Determinants of Health   Financial Resource Strain: Not on file  Food Insecurity: No Food Insecurity (01/12/2021)   Hunger Vital Sign    Worried About Running Out of Food in the Last Year: Never true    Ran Out of Food in the Last Year: Never true  Transportation Needs: Not on file  Physical Activity: Not on file  Stress: Not on file  Social Connections: Not on file  Intimate Partner Violence: Not on file    Outpatient Medications Prior to Visit  Medication Sig Dispense Refill   albuterol (PROVENTIL) (5 MG/ML) 0.5% nebulizer solution Take 0.5 mLs (2.5 mg total) by nebulization every 6 (six) hours as needed for wheezing or shortness of breath. 40 mL 3   albuterol (VENTOLIN HFA) 108 (90 Base) MCG/ACT inhaler USE 2 INHALATIONS ORALLY   EVERY 6 HOURS AS NEEDED FORWHEEZING OR SHORTNESS OF   BREATH. 54 g 1   amLODipine (NORVASC) 5 MG tablet Take 1 tablet (5 mg total) by mouth daily. 90 tablet 1   anastrozole (ARIMIDEX) 1 MG tablet Take 1 tablet (1 mg total) by mouth daily. 90 tablet 3   CALCIUM PO Take 1 tablet by mouth every evening.     cholecalciferol (VITAMIN D3) 25 MCG (1000  UNIT) tablet Take 1,000 Units by mouth every evening.      clopidogrel (PLAVIX) 75 MG tablet TAKE 1 TABLET EVERY DAY 90 tablet 1   EPINEPHrine 0.3 mg/0.3 mL IJ SOAJ injection Inject 0.3 mg into the muscle as needed for anaphylaxis. 2 each 1   fluticasone-salmeterol (WIXELA INHUB) 250-50 MCG/ACT AEPB Inhale 1 puff into the lungs in the morning and at bedtime. 180 each 6   furosemide (LASIX) 20 MG tablet TAKE 1 TABLET(20 MG) BY MOUTH DAILY AS NEEDED FOR SWELLING 30 tablet 3   gabapentin (NEURONTIN) 100 MG capsule TAKE 2 CAPSULES THREE TIMES DAILY 540 capsule 3   GEMTESA 75 MG TABS Take 1 tablet by mouth daily.     latanoprost (XALATAN) 0.005 % ophthalmic solution 1 drop at bedtime.     levothyroxine (SYNTHROID) 150 MCG tablet Take 1 tablet (150 mcg total) by mouth daily. 45 tablet 3   lisinopril (ZESTRIL) 20 MG tablet  TAKE 2 TABLETS (40 MG TOTAL) BY MOUTH DAILY. 180 tablet 0   meclizine (ANTIVERT) 25 MG tablet Take 1 tablet (25 mg total) by mouth 3 (three) times daily as needed for dizziness. 30 tablet 0   metFORMIN (GLUCOPHAGE) 500 MG tablet Take 1 tablet (500 mg total) by mouth 2 (two) times daily with a meal. (Patient taking differently: Take 500 mg by mouth daily with breakfast.) 180 tablet 2   methocarbamol (ROBAXIN) 500 MG tablet Take 1 tablet (500 mg total) by mouth every 8 (eight) hours as needed for muscle spasms. 20 tablet 0   Multiple Vitamin (MULTIVITAMIN ADULT) TABS Take 1 tablet by mouth daily.     Multiple Vitamins-Minerals (OCUVITE PO) Take 1 tablet by mouth daily.     Omega-3 Fatty Acids (FISH OIL) 1000 MG CAPS Take 1 capsule by mouth every evening.      ondansetron (ZOFRAN) 4 MG tablet Take 1 tablet (4 mg total) by mouth every 8 (eight) hours as needed for nausea or vomiting. 20 tablet 0   pantoprazole (PROTONIX) 40 MG tablet TAKE 1 TABLET EVERY DAY 90 tablet 1   predniSONE (DELTASONE) 10 MG tablet 4 tabs by mouth once daily for 2 days, then 3 tabs daily x 2 days, then 2 tabs daily  x 2 days, then 1 tab daily x 2 days 20 tablet 0   rosuvastatin (CRESTOR) 5 MG tablet Take 1 tablet (5 mg total) by mouth daily. 90 tablet 3   Semaglutide, 1 MG/DOSE, 4 MG/3ML SOPN Inject 1 mg into the skin once a week. 3 mL 0   traMADol (ULTRAM) 50 MG tablet Take 1 tablet (50 mg total) by mouth every 6 (six) hours as needed for moderate pain or severe pain. 20 tablet 0   No facility-administered medications prior to visit.    Allergies  Allergen Reactions   Bee Venom Anaphylaxis   Betadine [Povidone Iodine] Anaphylaxis    Rash. Breathing problems.    Codeine Shortness Of Breath and Rash   Contrast Media [Iodinated Contrast Media] Shortness Of Breath   Gadolinium Derivatives Hives, Rash, Shortness Of Breath and Swelling    "my heart stopped beating"    Iodine Other (See Comments)    CARDIAC ARREST   Latex Anaphylaxis and Rash   Lidocaine Shortness Of Breath and Swelling    SWELLING OF MOUTH AND THROAT, low BP   Penicillins Shortness Of Breath, Rash and Other (See Comments)    SEIZURE Did it involve swelling of the face/tongue/throat, SOB, or low BP? no Did it involve sudden or severe rash/hives, skin peeling, or any reaction on the inside of your mouth or nose? yes Did you need to seek medical attention at a hospital or doctor's office? yes When did it last happen?      2010 If all above answers are "NO", may proceed with cephalosporin use.    Pentazocine Lactate Shortness Of Breath and Rash   Shellfish Allergy Shortness Of Breath and Rash   Aspirin Rash and Other (See Comments)    GI UPSET   Erythromycin Swelling and Rash    SWELLING OF JOINTS   Symbicort [Budesonide-Formoterol Fumarate] Other (See Comments)    BURNING OF TONGUE AND LIPS   Compazine [Prochlorperazine Maleate] Rash    Rash on face,chest, arms, back   Sulfonamide Derivatives Rash    Review of Systems  Respiratory:  Positive for shortness of breath.        Objective:    Physical Exam Constitutional:  General: She is not in acute distress.    Appearance: Normal appearance. She is not ill-appearing.  HENT:     Head: Normocephalic and atraumatic.     Right Ear: External ear normal.     Left Ear: External ear normal.  Eyes:     Extraocular Movements: Extraocular movements intact.     Pupils: Pupils are equal, round, and reactive to light.  Cardiovascular:     Rate and Rhythm: Normal rate and regular rhythm.     Heart sounds: Normal heart sounds. No murmur heard.    No gallop.  Pulmonary:     Effort: Pulmonary effort is normal. No respiratory distress.     Breath sounds: Examination of the left-upper field reveals wheezing. Wheezing (soft expiratory) present. No rales.  Skin:    General: Skin is warm and dry.  Neurological:     Mental Status: She is alert and oriented to person, place, and time.  Psychiatric:        Mood and Affect: Mood normal.        Behavior: Behavior normal.        Judgment: Judgment normal.     BP 138/74 (BP Location: Right Arm, Patient Position: Sitting, Cuff Size: Large)   Pulse 72   Temp 98.1 F (36.7 C) (Oral)   Resp 16   Wt 255 lb (115.7 kg)   SpO2 99%   BMI 41.16 kg/m  Wt Readings from Last 3 Encounters:  05/08/22 255 lb (115.7 kg)  05/01/22 245 lb (111.1 kg)  04/27/22 250 lb (113.4 kg)       Assessment & Plan:   Problem List Items Addressed This Visit       Unprioritized   Bronchitis - Primary    Had neg chest x-ray following virtual exam.  She did have some response to steroids.  At this point, given ongoing symptoms will plan rx with doxycycline. She will continue her advair and albuterol prn. Advised her to schedule follow up with her pulmonologist.       Relevant Medications   doxycycline (VIBRA-TABS) 100 MG tablet   Meds ordered this encounter  Medications   doxycycline (VIBRA-TABS) 100 MG tablet    Sig: Take 1 tablet (100 mg total) by mouth 2 (two) times daily.    Dispense:  14 tablet    Refill:  0    I, Debbrah Alar, personally preformed the services described in this documentation.  All medical record entries made by the scribe were at my direction and in my presence.  I have reviewed the chart and discharge instructions (if applicable) and agree that the record reflects my personal performance and is accurate and complete. 05/08/2022.   I,Verona Buck,acting as a Education administrator for [employer], NP.,have documented all relevant documentation on the behalf of Nance Pear, NP,as directed by  Nance Pear, NP while in the presence of Nance Pear, NP.    Nance Pear, NP

## 2022-05-08 NOTE — Patient Instructions (Signed)
Add mucinex '600mg'$  twice daily. Start doxycycline. Schedule follow up with your pulmonary doctor.

## 2022-05-08 NOTE — Telephone Encounter (Signed)
Patient in provider's folder

## 2022-05-08 NOTE — Telephone Encounter (Signed)
Patient said she forgot to grab paperwork when she was here but wanted to know if we faxed it or what happens when it's ready. Advised that it is in the provider's box and that someone would call her when ready per the original note when she dropped it off. Please call patient to advise.

## 2022-05-08 NOTE — Assessment & Plan Note (Signed)
Had neg chest x-ray following virtual exam.  She did have some response to steroids.  At this point, given ongoing symptoms will plan rx with doxycycline. She will continue her advair and albuterol prn. Advised her to schedule follow up with her pulmonologist.

## 2022-05-09 ENCOUNTER — Encounter: Payer: Self-pay | Admitting: Family

## 2022-05-09 NOTE — Telephone Encounter (Signed)
Lvm for patient to call back and let me know if she will like form mailed out left at front office for pick up

## 2022-05-10 ENCOUNTER — Encounter: Payer: Self-pay | Admitting: Bariatrics

## 2022-05-10 NOTE — Telephone Encounter (Signed)
Patient messaged me back and document left at front for her to pick up

## 2022-05-10 NOTE — Progress Notes (Signed)
Chief Complaint:   OBESITY Michelle Kennedy is here to discuss her progress with her obesity treatment plan along with follow-up of her obesity related diagnoses. Michelle Kennedy is on the Category 3 Plan and states she is following her eating plan approximately 70% of the time. Michelle Kennedy states she is doing 0 minutes 0 times per week.  Today's visit was #: 12 Starting weight: 275 lbs Starting date: 07/28/2021 Today's weight: 250 lbs Today's date: 04/27/2022 Total lbs lost to date: 25 Total lbs lost since last in-office visit: 0  Interim History: Michelle Kennedy is up 5 lbs since her last visit. According to the bioimpedance scale, she is up about 4 lbs of water (had stopped Lasix).   Subjective:   1. Pre-diabetes Michelle Kennedy is taking Ozempic.   2. Polyphagia Michelle Kennedy is taking Ozempic.   Assessment/Plan:   1. Pre-diabetes Michelle Kennedy will continue Ozempic and she will increase from 0.5 mg to 1 mg.   - Semaglutide, 1 MG/DOSE, 4 MG/3ML SOPN; Inject 1 mg into the skin once a week.  Dispense: 3 mL; Refill: 0  2. Michelle Kennedy agreed to increase Ozempic from 0.5 mg to 1 mg once weekly, and we will refill for 1 month.   - Semaglutide, 1 MG/DOSE, 4 MG/3ML SOPN; Inject 1 mg into the skin once a week.  Dispense: 3 mL; Refill: 0  3. Obesity, current BMI 40.4 Michelle Kennedy is currently in the action stage of change. As such, her goal is to continue with weight loss efforts. She has agreed to the Category 4 Plan.   Meal planning and intentional eating were discussed. Increase water. Resume Lasix and notify her PCP.   Exercise goals: Will get back to yoga.   Behavioral modification strategies: increasing lean protein intake, decreasing simple carbohydrates, increasing vegetables, increasing water intake, decreasing eating out, no skipping meals, meal planning and cooking strategies, keeping healthy foods in the home, and keeping a strict food journal.  Michelle Kennedy has agreed to follow-up with our clinic in 4 weeks. She  was informed of the importance of frequent follow-up visits to maximize her success with intensive lifestyle modifications for her multiple health conditions.   Objective:   Blood pressure 119/79, pulse 72, temperature (!) 97.5 F (36.4 C), height '5\' 6"'$  (1.676 m), weight 250 lb (113.4 kg), SpO2 98 %. Body mass index is 40.35 kg/m.  General: Cooperative, alert, well developed, in no acute distress. HEENT: Conjunctivae and lids unremarkable. Cardiovascular: Regular rhythm.  Lungs: Normal work of breathing. Neurologic: No focal deficits.   Lab Results  Component Value Date   CREATININE 0.75 10/28/2021   BUN 13 10/28/2021   NA 141 10/28/2021   K 4.2 10/28/2021   CL 108 10/28/2021   CO2 27 10/28/2021   Lab Results  Component Value Date   ALT 27 07/20/2021   AST 17 07/20/2021   ALKPHOS 76 07/20/2021   BILITOT 0.4 07/20/2021   Lab Results  Component Value Date   HGBA1C 5.7 (A) 02/02/2022   HGBA1C 5.6 10/28/2021   HGBA1C 5.8 (H) 07/28/2021   HGBA1C 6.0 (A) 04/22/2021   HGBA1C 6.2 10/26/2020   Lab Results  Component Value Date   INSULIN 25.6 (H) 07/28/2021   INSULIN 21.8 09/20/2016   Lab Results  Component Value Date   TSH 0.05 (L) 02/02/2022   Lab Results  Component Value Date   CHOL 187 07/28/2021   HDL 64 07/28/2021   LDLCALC 102 (H) 07/28/2021   LDLDIRECT 117.0 02/26/2019   TRIG 122 07/28/2021  CHOLHDL 3 06/28/2020   Lab Results  Component Value Date   VD25OH 53.4 07/28/2021   VD25OH 41.4 09/20/2016   Lab Results  Component Value Date   WBC 5.1 07/28/2021   HGB 14.3 07/28/2021   HCT 45.0 07/28/2021   MCV 76 (L) 07/28/2021   PLT 255 07/28/2021   Lab Results  Component Value Date   IRON 71 06/23/2020   TIBC 294 06/23/2020   FERRITIN 94.6 06/23/2020   Attestation Statements:   Reviewed by clinician on day of visit: allergies, medications, problem list, medical history, surgical history, family history, social history, and previous encounter  notes.   Wilhemena Durie, am acting as Location manager for [employer], DO.  I have reviewed the above documentation for accuracy and completeness, and I agree with the above. Jearld Lesch, DO

## 2022-06-01 ENCOUNTER — Ambulatory Visit: Payer: Medicare Other | Admitting: Bariatrics

## 2022-06-05 ENCOUNTER — Ambulatory Visit: Payer: Medicare Other | Admitting: Bariatrics

## 2022-06-08 ENCOUNTER — Encounter: Payer: Self-pay | Admitting: Bariatrics

## 2022-06-08 ENCOUNTER — Ambulatory Visit (INDEPENDENT_AMBULATORY_CARE_PROVIDER_SITE_OTHER): Payer: Medicare Other | Admitting: Bariatrics

## 2022-06-08 VITALS — BP 150/83 | HR 78 | Temp 97.7°F | Ht 66.0 in | Wt 252.0 lb

## 2022-06-08 DIAGNOSIS — R632 Polyphagia: Secondary | ICD-10-CM

## 2022-06-08 DIAGNOSIS — Z6841 Body Mass Index (BMI) 40.0 and over, adult: Secondary | ICD-10-CM | POA: Diagnosis not present

## 2022-06-08 DIAGNOSIS — R7303 Prediabetes: Secondary | ICD-10-CM

## 2022-06-08 DIAGNOSIS — E669 Obesity, unspecified: Secondary | ICD-10-CM | POA: Diagnosis not present

## 2022-06-08 MED ORDER — SEMAGLUTIDE (1 MG/DOSE) 4 MG/3ML ~~LOC~~ SOPN: 1.0000 mg | PEN_INJECTOR | SUBCUTANEOUS | 0 refills | Status: DC

## 2022-06-12 ENCOUNTER — Telehealth: Payer: Self-pay | Admitting: Family

## 2022-06-12 NOTE — Telephone Encounter (Signed)
Copied from [facility] [id number]. Topic: Medicare AWV >> Jun 12, 2022 10:23 AM Devoria Glassing wrote: Reason for CRM: Left message for patient to schedule Annual Wellness Visit.  Please schedule with Health Nurse Advisor at [facility] Call Deans at [phone]

## 2022-06-19 NOTE — Progress Notes (Signed)
Chief Complaint:   OBESITY Michelle Kennedy is here to discuss her progress with her obesity treatment plan along with follow-up of her obesity related diagnoses. Michelle Kennedy is on the Category 4 Plan and states she is following her eating plan approximately 20% of the time. Michelle Kennedy states she is doing 0 minutes 0 times per week.  Today's visit was #: 12 Starting weight: 275 lbs Starting date: 07/28/2021 Today's weight: 252 lbs Today's date: 06/08/2022 Total lbs lost to date: 23 Total lbs lost since last in-office visit: 0  Interim History: Michelle Kennedy is up 2 lbs since her last visit. She is up approximately 2 lbs of water per our bioimpedance scale. She is getting adequate protein.   Subjective:   1. Pre-diabetes Michelle Kennedy is taking Ozempic, and she notes it helps with her appetite.   2. Polyphagia Michelle Kennedy is taking her medication as directed.   Assessment/Plan:   1. Pre-diabetes Michelle Kennedy will continue Ozempic, and we will refill for 1 month. She will work on decreasing sweets, sugars, and starches.  - Semaglutide, 1 MG/DOSE, 4 MG/3ML SOPN; Inject 1 mg into the skin once a week.  Dispense: 3 mL; Refill: 0  2. Polyphagia Michelle Kennedy will continue Ozempic as directed.  - Semaglutide, 1 MG/DOSE, 4 MG/3ML SOPN; Inject 1 mg into the skin once a week.  Dispense: 3 mL; Refill: 0  3. Obesity, current BMI 40.8 Michelle Kennedy is currently in the action stage of change. As such, her goal is to continue with weight loss efforts. She has agreed to the Category 4 Plan.   Meal planning was discussed. She will adhere more closely to the plan 80-90%. We will recheck fasting labs at her next visit.  Exercise goals: She will go to the gym (Silver sneakers and yoga).   Behavioral modification strategies: increasing lean protein intake, decreasing simple carbohydrates, increasing vegetables, increasing water intake, decreasing eating out, no skipping meals, meal planning and cooking strategies, and keeping healthy  foods in the home.  Michelle Kennedy has agreed to follow-up with our clinic in 4 weeks. She was informed of the importance of frequent follow-up visits to maximize her success with intensive lifestyle modifications for her multiple health conditions.   Objective:   Blood pressure (!) 150/83, pulse 78, temperature 97.7 F (36.5 C), height '5\' 6"'$  (1.676 m), SpO2 97 %. Body mass index is 41.16 kg/m.  General: Cooperative, alert, well developed, in no acute distress. HEENT: Conjunctivae and lids unremarkable. Cardiovascular: Regular rhythm.  Lungs: Normal work of breathing. Neurologic: No focal deficits.   Lab Results  Component Value Date   CREATININE 0.75 10/28/2021   BUN 13 10/28/2021   NA 141 10/28/2021   K 4.2 10/28/2021   CL 108 10/28/2021   CO2 27 10/28/2021   Lab Results  Component Value Date   ALT 27 07/20/2021   AST 17 07/20/2021   ALKPHOS 76 07/20/2021   BILITOT 0.4 07/20/2021   Lab Results  Component Value Date   HGBA1C 5.7 (A) 02/02/2022   HGBA1C 5.6 10/28/2021   HGBA1C 5.8 (H) 07/28/2021   HGBA1C 6.0 (A) 04/22/2021   HGBA1C 6.2 10/26/2020   Lab Results  Component Value Date   INSULIN 25.6 (H) 07/28/2021   INSULIN 21.8 09/20/2016   Lab Results  Component Value Date   TSH 0.05 (L) 02/02/2022   Lab Results  Component Value Date   CHOL 187 07/28/2021   HDL 64 07/28/2021   LDLCALC 102 (H) 07/28/2021   LDLDIRECT 117.0 02/26/2019  TRIG 122 07/28/2021   CHOLHDL 3 06/28/2020   Lab Results  Component Value Date   VD25OH 53.4 07/28/2021   VD25OH 41.4 09/20/2016   Lab Results  Component Value Date   WBC 5.1 07/28/2021   HGB 14.3 07/28/2021   HCT 45.0 07/28/2021   MCV 76 (L) 07/28/2021   PLT 255 07/28/2021   Lab Results  Component Value Date   IRON 71 06/23/2020   TIBC 294 06/23/2020   FERRITIN 94.6 06/23/2020   Attestation Statements:   Reviewed by clinician on day of visit: allergies, medications, problem list, medical history, surgical history,  family history, social history, and previous encounter notes.   Wilhemena Durie, am acting as Location manager for [employer], DO.  I have reviewed the above documentation for accuracy and completeness, and I agree with the above. Jearld Lesch, DO

## 2022-06-27 ENCOUNTER — Encounter: Payer: Self-pay | Admitting: Family Medicine

## 2022-06-27 ENCOUNTER — Encounter: Payer: Self-pay | Admitting: Bariatrics

## 2022-06-27 ENCOUNTER — Ambulatory Visit: Payer: Self-pay

## 2022-06-27 ENCOUNTER — Ambulatory Visit (INDEPENDENT_AMBULATORY_CARE_PROVIDER_SITE_OTHER): Payer: Medicare Other | Admitting: Family Medicine

## 2022-06-27 VITALS — BP 130/86 | Ht 66.5 in | Wt 250.0 lb

## 2022-06-27 DIAGNOSIS — M1711 Unilateral primary osteoarthritis, right knee: Secondary | ICD-10-CM | POA: Diagnosis present

## 2022-06-27 MED ORDER — TRIAMCINOLONE ACETONIDE 32 MG IX SRER
32.0000 mg | Freq: Once | INTRA_ARTICULAR | Status: AC
  Administered 2022-06-27: 32 mg via INTRA_ARTICULAR

## 2022-06-27 NOTE — Progress Notes (Signed)
Michelle Kennedy - 67 y.o. female MRN [id number]  Date of birth: [birth year]  SUBJECTIVE:  Including CC & ROS.  No chief complaint on file.   Michelle Kennedy is a 67 y.o. female that is presenting with worsening of her right knee pain.  The pain is severe in nature.  It was waking her up at night and causing her significant pain to where she is limping.  No specific injury.  It is similar to her previous pain.    Review of Systems See HPI   HISTORY: Past Medical, Surgical, Social, and Family History Reviewed & Updated per EMR.   Pertinent Historical Findings include:  Past Medical History:  Diagnosis Date   Abnormally small mouth    Achilles tendon disorder, right    Anemia    Anxiety    Arthritis    hips and knees   Asthma    daily inhaler, prn inhaler and neb.   Asthma due to environmental allergies    Breast cancer ([place]) 01/2014   left   Chest pain    CHF (congestive heart failure) (HCC)    COPD (chronic obstructive pulmonary disease) (HCC)    Dental bridge present    upper front and lower right   Dental crowns present    x 3   Depression    Esophageal spasm    reports since her chemo and breast surgery  she developed esophageal spasms and reports this is in the past has casue her 02 to desat in the  70s , denies sycnope in relation to this , does report hx of vertigo as well    Family history of anesthesia complication    twin brother aspirated and died on OR table, per pt.   Fibromyalgia    Gallbladder disease    Gallstones    GERD (gastroesophageal reflux disease)    Glaucoma    H/O blood clots    had blood to  PICC line    History of gastric ulcer    as a teenager   History of seizure age 11   as a reaction to Penicillin - no seizures since   History of stomach ulcers    History of thyroid cancer    s/p thyroidectomy   HTN (hypertension)    Hyperlipidemia    Hypothyroidism    Joint pain    Left knee injury    Liver disorder    seen when she had  her hysterectomy , reports she was told it was  " small lesion" ; but asymptomatic    Lymphedema    left arm   Migraines    Multiple food allergies    Obesity    OSA (obstructive sleep apnea) 02/16/2016   CPAP   Palpitations    reports no longer experiences   Personal history of chemotherapy 2015   Personal history of radiation therapy 2015   Left   Prediabetes    Rheumatoid arthritis (HCC)    SOB (shortness of breath)    Swelling of both ankles    Tinnitus    UTI (lower urinary tract infection) 02/17/2014   Vertigo    last episode  was 2 weeks ago    Vitamin D deficiency    Wears contact lenses    left eye only    Wears hearing aid in both ears     Past Surgical History:  Procedure Laterality Date   ABDOMINAL HYSTERECTOMY  2004   complete   ACHILLES TENDON  REPAIR Right    APPENDECTOMY  2004   BREAST EXCISIONAL BIOPSY     BREAST LUMPECTOMY Left    2015   BREAST LUMPECTOMY WITH NEEDLE LOCALIZATION AND AXILLARY SENTINEL LYMPH NODE BX Left 02/23/2014   Procedure: BREAST LUMPECTOMY WITH NEEDLE LOCALIZATION AND AXILLARY SENTINEL LYMPH NODE BIOPSY;  Surgeon: Excell Seltzer, MD;  Location: [facility];  Service: General;  Laterality: Left;   BREAST SURGERY  2011   left breast biposy   CHOLECYSTECTOMY  1990   COLONOSCOPY W/ POLYPECTOMY  06/2009   EYE SURGERY Right 2013   exc. warts from underneath eyelid   INCONTINENCE SURGERY  2004   KNEE ARTHROSCOPY Bilateral    x 6 each knee   LIGAMENT REPAIR Right    thumb/wrist   ORIF TOE FRACTURE Right    great toe   PORTACATH PLACEMENT N/A 03/24/2014   Procedure: INSERTION PORT-A-CATH;  Surgeon: Excell Seltzer, MD;  Location: [facility];  Service: General;  Laterality: N/A;; removed    THYROIDECTOMY  2000   TONSILLECTOMY AND ADENOIDECTOMY  2000   TOTAL KNEE ARTHROPLASTY Left 12/03/2017   Procedure: LEFT TOTAL KNEE ARTHROPLASTY;  Surgeon: Gaynelle Arabian, MD;  Location: [facility];  Service: Orthopedics;   Laterality: Left;   TUMOR EXCISION     from thoracic spine     PHYSICAL EXAM:  VS: BP 130/86   Ht 5' 6.5" (1.689 m)   Wt 250 lb (113.4 kg)   BMI 39.75 kg/m  Physical Exam Gen: NAD, alert, cooperative with exam, well-appearing MSK:  Neurovascularly intact     Aspiration/Injection Procedure Note Michelle Kennedy [birth year]  Procedure: Aspiration and Injection Indications: right knee pain  Procedure Details Consent: Risks of procedure as well as the alternatives and risks of each were explained to the (patient/caregiver).  Consent for procedure obtained. Time Out: Verified patient identification, verified procedure, site/side was marked, verified correct patient position, special equipment/implants available, medications/allergies/relevent history reviewed, required imaging and test results available.  Performed.  The area was cleaned with iodine and alcohol swabs.    The right knee superior lateral suprapatellar pouch was injected using 0.4cc's of 8.4% sodium bicarb and  3 cc of 1% lidocaine on a 21-gauge 2 inch needle.  An 18-gauge 1-1/2 needle was used to achieve aspiration.  The syringe was switched and a mixture containing 5 cc's of 32 mg Zilretta and 4 cc's of 0.25% bupivacaine was injected.  Ultrasound was used. Images were obtained in long views showing the injection.   Amount of Fluid Aspirated:  56m Character of Fluid: clear and straw colored Fluid was sent for: n/a  A sterile dressing was applied.  Patient did tolerate procedure well.       ASSESSMENT & PLAN:   OA (osteoarthritis) of knee Acute on chronic in nature.  Has known degenerative changes within the knee and exacerbation of underlying changes. -Counseled on home exercise therapy and supportive care. - aspiration and zilretta injection  - referral to physical therapy  - could consider gel injection

## 2022-06-27 NOTE — Patient Instructions (Signed)
Good to see you Please use ice as needed  Please let us know when the pain wears off and we can verify benefits for the gel injection We have made a referral to physical therapy  Please send me a message in MyChart with any questions or updates.  Please see me back if you want to try the gel injection.   --Dr. Raeford Razor

## 2022-06-27 NOTE — Assessment & Plan Note (Signed)
Acute on chronic in nature.  Has known degenerative changes within the knee and exacerbation of underlying changes. -Counseled on home exercise therapy and supportive care. - aspiration and zilretta injection  - referral to physical therapy  - could consider gel injection

## 2022-06-28 ENCOUNTER — Telehealth: Payer: Self-pay

## 2022-06-28 NOTE — Telephone Encounter (Signed)
Received patients benefits via [employer]. Patient is covered with Medicare. Patient responsibility for [phone] and CPT code 20610 will be 20% with the remaining covered at 80% by the payer at the contracted rate. This years Medicare deductible is $240 and the patient has met $121. Deductible must be met before coverage applies. Out of pocket does not apply to these services.  $20.00 copay

## 2022-07-06 ENCOUNTER — Encounter: Payer: Self-pay | Admitting: Bariatrics

## 2022-07-06 ENCOUNTER — Ambulatory Visit (INDEPENDENT_AMBULATORY_CARE_PROVIDER_SITE_OTHER): Payer: Medicare Other | Admitting: Bariatrics

## 2022-07-06 VITALS — BP 126/74 | HR 64 | Temp 97.6°F | Ht 66.0 in | Wt 249.0 lb

## 2022-07-06 DIAGNOSIS — E559 Vitamin D deficiency, unspecified: Secondary | ICD-10-CM | POA: Diagnosis not present

## 2022-07-06 DIAGNOSIS — R632 Polyphagia: Secondary | ICD-10-CM

## 2022-07-06 DIAGNOSIS — R7303 Prediabetes: Secondary | ICD-10-CM | POA: Diagnosis not present

## 2022-07-06 DIAGNOSIS — E782 Mixed hyperlipidemia: Secondary | ICD-10-CM | POA: Diagnosis not present

## 2022-07-06 DIAGNOSIS — Z6841 Body Mass Index (BMI) 40.0 and over, adult: Secondary | ICD-10-CM

## 2022-07-06 MED ORDER — SEMAGLUTIDE (1 MG/DOSE) 4 MG/3ML ~~LOC~~ SOPN: 1.0000 mg | PEN_INJECTOR | SUBCUTANEOUS | 0 refills | Status: DC

## 2022-07-07 ENCOUNTER — Telehealth: Payer: Self-pay | Admitting: Family

## 2022-07-07 LAB — INSULIN, RANDOM: INSULIN: 18 u[IU]/mL (ref 2.6–24.9)

## 2022-07-07 LAB — COMPREHENSIVE METABOLIC PANEL
ALT: 19 IU/L (ref 0–32)
AST: 15 IU/L (ref 0–40)
Albumin/Globulin Ratio: 1.9 (ref 1.2–2.2)
Albumin: 4.6 g/dL (ref 3.9–4.9)
Alkaline Phosphatase: 71 IU/L (ref 44–121)
BUN/Creatinine Ratio: 18 (ref 12–28)
BUN: 17 mg/dL (ref 8–27)
Bilirubin Total: 0.4 mg/dL (ref 0.0–1.2)
CO2: 22 mmol/L (ref 20–29)
Calcium: 9.7 mg/dL (ref 8.7–10.3)
Chloride: 103 mmol/L (ref 96–106)
Creatinine, Ser: 0.95 mg/dL (ref 0.57–1.00)
Globulin, Total: 2.4 g/dL (ref 1.5–4.5)
Glucose: 90 mg/dL (ref 70–99)
Potassium: 4.7 mmol/L (ref 3.5–5.2)
Sodium: 141 mmol/L (ref 134–144)
Total Protein: 7 g/dL (ref 6.0–8.5)
eGFR: 65 mL/min/{1.73_m2} (ref >59)

## 2022-07-07 LAB — LIPID PANEL WITH LDL/HDL RATIO
Cholesterol, Total: 170 mg/dL (ref 100–199)
HDL: 73 mg/dL (ref >39)
LDL Chol Calc (NIH): 81 mg/dL (ref 0–99)
LDL/HDL Ratio: 1.1 ratio (ref 0.0–3.2)
Triglycerides: 85 mg/dL (ref 0–149)
VLDL Cholesterol Cal: 16 mg/dL (ref 5–40)

## 2022-07-07 LAB — HEMOGLOBIN A1C
Est. average glucose Bld gHb Est-mCnc: 117 mg/dL
Hgb A1c MFr Bld: 5.7 % — ABNORMAL HIGH (ref 4.8–5.6)

## 2022-07-07 LAB — VITAMIN D 25 HYDROXY (VIT D DEFICIENCY, FRACTURES): Vit D, 25-Hydroxy: 40.4 ng/mL (ref 30.0–100.0)

## 2022-07-07 NOTE — Telephone Encounter (Signed)
Copied from [place] [id number]. Topic: Medicare AWV >> Jul 07, 2022  1:54 PM Devoria Glassing wrote: Reason for CRM: Left message for patient to schedule Annual Wellness Visit(AWV).  Please schedule with Health Nurse Advisor at [facility]. Please call [phone] ask for [facility].

## 2022-07-14 ENCOUNTER — Telehealth: Payer: Self-pay | Admitting: Family

## 2022-07-14 NOTE — Telephone Encounter (Signed)
[facility] called to follow up on surgical clearance faxed on 06/27/22. Clearance scanned into media and in providers box up front. Surgery scheduled for 07/28/22 so this needs to be sent back to them asap

## 2022-07-14 NOTE — Telephone Encounter (Signed)
Document placed on provider's folder

## 2022-07-18 ENCOUNTER — Telehealth: Payer: Self-pay | Admitting: Family

## 2022-07-18 NOTE — Telephone Encounter (Signed)
It looks like she is scheduled for a peripheral nerve stim test with Dr. Matilde Sprang on 07/28/22. Can you please check with Urology if they will be using local or general anesthesia for this procedure?

## 2022-07-19 NOTE — Telephone Encounter (Signed)
Please fax clearance letter to Dr. Matilde Sprang.

## 2022-07-20 NOTE — Progress Notes (Signed)
Chief Complaint:   OBESITY Michelle Kennedy is here to discuss her progress with her obesity treatment plan along with follow-up of her obesity related diagnoses. Michelle Kennedy is on the Category 4 Plan and states she is following her eating plan approximately 60% of the time. Michelle Kennedy states she is doing Silver Sneakers 45 minutes 2 times per week.  Today's visit was #: 14 Starting weight: 275 lbs Starting date: 07/28/2021 Today's weight: 249 lbs Today's date: 07/06/2022 Total lbs lost to date: 26 Total lbs lost since last in-office visit: 3  Interim History: Michelle Kennedy is down 3 lbs since her last visit. She is back to exercising.  Subjective:   1. Pre-diabetes Michelle Kennedy is taking Semaglutide as directed.  2. Polyphagia Michelle Kennedy is taking Ozempic.  3. Mixed hyperlipidemia Michelle Kennedy has hyperlipidemia and has been trying to improve her cholesterol levels with intensive lifestyle modifications including a low saturated fat diet, exercise, and weight loss. She denies any chest pain, claudication, or myalgias.  4. Vitamin D deficiency She is currently taking OTC vitamin D 1,000 IU each day. She denies nausea, vomiting or muscle weakness.  Assessment/Plan:   1. Pre-diabetes Continue Semaglutide as directed. Obtain fasting labs today.  Refill- Semaglutide, 1 MG/DOSE, 4 MG/3ML SOPN; Inject 1 mg into the skin once a week.  Dispense: 3 mL; Refill: 0  - Insulin, random - Hemoglobin A1c - Comprehensive metabolic panel  2. Polyphagia Decrease sweets and sugars. Obtain fasting labs today.  Refill- Semaglutide, 1 MG/DOSE, 4 MG/3ML SOPN; Inject 1 mg into the skin once a week.  Dispense: 3 mL; Refill: 0  - Insulin, random - Hemoglobin A1c  3. Mixed hyperlipidemia Obtain fasting labs today.  - Lipid Panel With LDL/HDL Ratio - Comprehensive metabolic panel  4. Vitamin D deficiency Continue OTC Vitamin D 1,000 IU daily. Obtain fasting labs today.  - VITAMIN D 25 Hydroxy (Vit-D Deficiency,  Fractures)  5. Morbid obesity ([place]) 6. BMI 40.0-44.9, adult ([place]) Meal planning Will adhere to the plan 80-90% of the time.  Michelle Kennedy is currently in the action stage of change. As such, her goal is to continue with weight loss efforts. She has agreed to the Category 4 Plan.   Exercise goals:  Continue Silver Sneakers 45 minutes 2 days a week.  Behavioral modification strategies: increasing lean protein intake, decreasing simple carbohydrates, increasing vegetables, increasing water intake, decreasing eating out, no skipping meals, meal planning and cooking strategies, keeping healthy foods in the home, and planning for success.  Michelle Kennedy has agreed to follow-up with our clinic in 4 weeks. She was informed of the importance of frequent follow-up visits to maximize her success with intensive lifestyle modifications for her multiple health conditions.   Michelle Kennedy was informed we would discuss her lab results at her next visit unless there is a critical issue that needs to be addressed sooner. Michelle Kennedy agreed to keep her next visit at the agreed upon time to discuss these results.  Objective:   Blood pressure 126/74, pulse 64, temperature 97.6 F (36.4 C), height '5\' 6"'$  (1.676 m), weight 249 lb (112.9 kg), SpO2 98 %. Body mass index is 40.19 kg/m.  General: Cooperative, alert, well developed, in no acute distress. HEENT: Conjunctivae and lids unremarkable. Cardiovascular: Regular rhythm.  Lungs: Normal work of breathing. Neurologic: No focal deficits.   Lab Results  Component Value Date   CREATININE 0.95 07/06/2022   BUN 17 07/06/2022   NA 141 07/06/2022   K 4.7 07/06/2022   CL 103 07/06/2022  CO2 22 07/06/2022   Lab Results  Component Value Date   ALT 19 07/06/2022   AST 15 07/06/2022   ALKPHOS 71 07/06/2022   BILITOT 0.4 07/06/2022   Lab Results  Component Value Date   HGBA1C 5.7 (H) 07/06/2022   HGBA1C 5.7 (A) 02/02/2022   HGBA1C 5.6 10/28/2021   HGBA1C 5.8 (H)  07/28/2021   HGBA1C 6.0 (A) 04/22/2021   Lab Results  Component Value Date   INSULIN 18.0 07/06/2022   INSULIN 25.6 (H) 07/28/2021   INSULIN 21.8 09/20/2016   Lab Results  Component Value Date   TSH 0.05 (L) 02/02/2022   Lab Results  Component Value Date   CHOL 170 07/06/2022   HDL 73 07/06/2022   LDLCALC 81 07/06/2022   LDLDIRECT 117.0 02/26/2019   TRIG 85 07/06/2022   CHOLHDL 3 06/28/2020   Lab Results  Component Value Date   VD25OH 40.4 07/06/2022   VD25OH 53.4 07/28/2021   VD25OH 41.4 09/20/2016   Lab Results  Component Value Date   WBC 5.1 07/28/2021   HGB 14.3 07/28/2021   HCT 45.0 07/28/2021   MCV 76 (L) 07/28/2021   PLT 255 07/28/2021   Lab Results  Component Value Date   IRON 71 06/23/2020   TIBC 294 06/23/2020   FERRITIN 94.6 06/23/2020   Attestation Statements:   Reviewed by clinician on day of visit: allergies, medications, problem list, medical history, surgical history, family history, social history, and previous encounter notes.  I, Kathlene November, BS, CMA, am acting as transcriptionist for [employer], DO.  I have reviewed the above documentation for accuracy and completeness, and I agree with the above. Jearld Lesch, DO

## 2022-07-20 NOTE — Telephone Encounter (Signed)
Clearance form and letter faxed to [facility] this morning.

## 2022-07-23 ENCOUNTER — Other Ambulatory Visit: Payer: Self-pay | Admitting: Family

## 2022-07-24 ENCOUNTER — Encounter: Payer: Self-pay | Admitting: Bariatrics

## 2022-07-31 NOTE — Therapy (Unsigned)
OUTPATIENT PHYSICAL THERAPY LOWER EXTREMITY EVALUATION   Patient Name: Margareth Medeiros MRN: [id number] DOB:[birth year], 67 y.o., female Today's Date: 08/01/2022  END OF SESSION:  PT End of Session - 08/01/22 1543     Visit Number 1    Number of Visits 13    Date for PT Re-Evaluation 10/30/22    Authorization Type MCR    PT Start Time 1545   arrives late   PT Stop Time 1615    PT Time Calculation (min) 30 min    Activity Tolerance Patient tolerated treatment well;Patient limited by pain    Behavior During Therapy [facility] for tasks assessed/performed             Past Medical History:  Diagnosis Date   Abnormally small mouth    Achilles tendon disorder, right    Anemia    Anxiety    Arthritis    hips and knees   Asthma    daily inhaler, prn inhaler and neb.   Asthma due to environmental allergies    Breast cancer ([place]) 01/2014   left   Chest pain    CHF (congestive heart failure) (HCC)    COPD (chronic obstructive pulmonary disease) (HCC)    Dental bridge present    upper front and lower right   Dental crowns present    x 3   Depression    Esophageal spasm    reports since her chemo and breast surgery  she developed esophageal spasms and reports this is in the past has casue her 02 to desat in the  70s , denies sycnope in relation to this , does report hx of vertigo as well    Family history of anesthesia complication    twin brother aspirated and died on OR table, per pt.   Fibromyalgia    Gallbladder disease    Gallstones    GERD (gastroesophageal reflux disease)    Glaucoma    H/O blood clots    had blood to  PICC line    History of gastric ulcer    as a teenager   History of seizure age 1   as a reaction to Penicillin - no seizures since   History of stomach ulcers    History of thyroid cancer    s/p thyroidectomy   HTN (hypertension)    Hyperlipidemia    Hypothyroidism    Joint pain    Left knee injury    Liver disorder    seen when she had her  hysterectomy , reports she was told it was  " small lesion" ; but asymptomatic    Lymphedema    left arm   Migraines    Multiple food allergies    Obesity    OSA (obstructive sleep apnea) 02/16/2016   CPAP   Palpitations    reports no longer experiences   Personal history of chemotherapy 2015   Personal history of radiation therapy 2015   Left   Prediabetes    Rheumatoid arthritis (HCC)    SOB (shortness of breath)    Swelling of both ankles    Tinnitus    UTI (lower urinary tract infection) 02/17/2014   Vertigo    last episode  was 2 weeks ago    Vitamin D deficiency    Wears contact lenses    left eye only    Wears hearing aid in both ears    Past Surgical History:  Procedure Laterality Date   ABDOMINAL HYSTERECTOMY  2004  complete   ACHILLES TENDON REPAIR Right    APPENDECTOMY  2004   BREAST EXCISIONAL BIOPSY     BREAST LUMPECTOMY Left    2015   BREAST LUMPECTOMY WITH NEEDLE LOCALIZATION AND AXILLARY SENTINEL LYMPH NODE BX Left 02/23/2014   Procedure: BREAST LUMPECTOMY WITH NEEDLE LOCALIZATION AND AXILLARY SENTINEL LYMPH NODE BIOPSY;  Surgeon: Excell Seltzer, MD;  Location: [place];  Service: General;  Laterality: Left;   BREAST SURGERY  2011   left breast biposy   CHOLECYSTECTOMY  1990   COLONOSCOPY W/ POLYPECTOMY  06/2009   EYE SURGERY Right 2013   exc. warts from underneath eyelid   INCONTINENCE SURGERY  2004   KNEE ARTHROSCOPY Bilateral    x 6 each knee   LIGAMENT REPAIR Right    thumb/wrist   ORIF TOE FRACTURE Right    great toe   PORTACATH PLACEMENT N/A 03/24/2014   Procedure: INSERTION PORT-A-CATH;  Surgeon: Excell Seltzer, MD;  Location: [place];  Service: General;  Laterality: N/A;; removed    THYROIDECTOMY  2000   TONSILLECTOMY AND ADENOIDECTOMY  2000   TOTAL KNEE ARTHROPLASTY Left 12/03/2017   Procedure: LEFT TOTAL KNEE ARTHROPLASTY;  Surgeon: Gaynelle Arabian, MD;  Location: [facility];  Service: Orthopedics;   Laterality: Left;   TUMOR EXCISION     from thoracic spine   Patient Active Problem List   Diagnosis Date Noted   Bronchitis 05/08/2022   Polyphagia 04/27/2022   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   Abdominal pannus 03/02/2022   Class 3 severe obesity with serious comorbidity and body mass index (BMI) of 40.0 to 44.9 in adult [facility]) 03/02/2022   Vertigo 02/07/2022   Peroneal tendinitis of left lower extremity 02/07/2022   Visceral obesity 02/01/2022   Sacral pain 09/20/2021   Pain in right knee 09/13/2021   Chronic left-sided low back pain with left-sided sciatica 04/27/2021   Nondisplaced fracture of middle phalanx of left lesser toe(s), initial encounter for closed fracture 09/17/2020   Chronic diastolic CHF (congestive heart failure) ([place]) 08/23/2019   Retinal artery occlusion 08/22/2019   History of total left knee replacement 12/07/2017   OA (osteoarthritis) of knee 12/03/2017   Morbid obesity ([place]) 01/01/2017   Essential hypertension 10/18/2016   Constipation 10/04/2016   Vitamin D deficiency 10/04/2016   Prediabetes 10/04/2016   BMI 40.0-44.9, adult ([place]) 09/20/2016   OSA (obstructive sleep apnea) 02/16/2016   Malignant neoplasm of thyroid gland ([place]) 10/11/2015   De Quervain's tenosynovitis, bilateral 09/15/2015   Trigger finger, acquired 06/22/2015   Chemotherapy-induced peripheral neuropathy ([place]) 05/10/2015   Postmenopausal osteoporosis 12/08/2014   Preventative health care 12/01/2014   DVT (deep venous thrombosis) ([place]) 08/28/2014   Lymphedema of arm 08/28/2014   Mucositis due to chemotherapy 06/02/2014   Menopausal syndrome (hot flashes) 12/31/2013   Breast cancer of upper-outer quadrant of left female breast ([place]) 12/26/2013   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   Gingivitis 12/05/2013   Seasonal allergies 09/24/2013   Hyperlipidemia 06/02/2011   Migraine 06/02/2011   Tinnitus of both ears 02/14/2011   Fibromyalgia 05/10/2010   Asthma  03/22/2010   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   GERD 12/20/2009   Depression 12/01/2009    PCP: Debbrah Alar, NP   REFERRING PROVIDER:   Rosemarie Ax, MD    REFERRING DIAG:  Diagnosis  M17.11 (ICD-10-CM) - Primary osteoarthritis of right knee    THERAPY DIAG:  Pain in joint of right knee  Difficulty walking  Muscle weakness (generalized)  Rationale for Evaluation and Treatment: Rehabilitation  ONSET DATE: 8 months ago  SUBJECTIVE:   SUBJECTIVE STATEMENT: Pt reports she has had 2 injections for the knee but they wear off. She reports the L knee pain started getting worse about 8 months ago. Pt has history of fibromyalgia and OA. Pt does do Silver Sneakers- sidestepping, weight lifting, squatting, chair exercise. Walks 30 mins a day- does wear a brace on the R knee. Has access to a pool in Summer. Member at [employer]. Pt is trying to delay the R TKA as much possible. Can walk about 30-35 mins before aching. Will bring a cane for long distance. No specific MOI. Has treadmill, upright bike, hand weights and kettle bells that go up to 15lbs. Pt denies cancer red flags. Pt denies NT.   PERTINENT HISTORY: Gel and Zilretta injections; L TKA; bladder stimulator 3/8; fibro, HTN PAIN:  Are you having pain? Yes: NPRS scale: 3/10 Pain location: medial inferior patella  Pain description: aching Aggravating factors: stairs, inclines, especially down Relieving factors: icing, elevating, resting  PRECAUTIONS: cannot lay supine due to bladder implant  WEIGHT BEARING RESTRICTIONS: No  FALLS:  Has patient fallen in last 6 months? 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 No acute osseus changes s/p  left knee replacement without any sign of loosening   PATIENT SURVEYS:   FOTO unable due to late arrival  COGNITION: Overall cognitive status: Within functional limits for tasks assessed     SENSATION: WFL   MUSCLE LENGTH: Hamstrings: unable to lay supine due to bladder surgery Positive Ely's on R  POSTURE: rounded shoulders and increased thoracic kyphosis  PALPATION: TTP of R knee joint line, inferio-medial patellar pole, patellar tendon; hypertonicity of L quad and HS  LOWER EXTREMITY ROM: L knee ROM lacking likely due to history of TKA  Active ROM *Right* eval Left eval  Hip flexion 110 110  Hip extension 0 0  Hip abduction    Hip adduction    Hip internal rotation    Hip external rotation    Knee flexion 125 120  Knee extension 0 -2  Ankle dorsiflexion    Ankle plantarflexion    Ankle inversion    Ankle eversion     (Blank rows = not tested)  LOWER EXTREMITY MMT:  MMT Right eval Left eval  Hip flexion    Hip extension    Hip abduction 37.7 43.8  Hip adduction    Hip internal rotation    Hip external rotation    Knee flexion 37.1 34.1  Knee extension 55.3 61.8  Ankle dorsiflexion    Ankle plantarflexion    Ankle inversion    Ankle eversion     (Blank rows = not tested)  LOWER EXTREMITY SPECIAL TESTS:  Knee special tests: Anterior drawer test: negative, Thessaly test: positive , and Step up/down test: positive   FUNCTIONAL TESTS:  5 times sit to stand: 8.7s   GAIT: Distance walked: 26f Assistive device utilized: None Level of assistance: Complete Independence Comments: mild  toe out, decreased TKE on R  Steps: able to ascend and descend reciprocally but requires UE with descent and pain with L LE lowering/ R eccentric   TODAY'S TREATMENT:                                                                                                                               DATE: 08/01/22    Exercises - Prone Quadriceps Stretch with Strap  - 2 x daily - 7 x weekly - 1 sets - 3 reps - 30 hold - Staggered Stance Squat  - 1 x daily - 3 x weekly - 2 sets - 10 reps - Side Stepping with Resistance at Ankles  - 1 x daily - 3 x weekly - 1 sets - 3 reps - 93f hold   PATIENT EDUCATION:  Education details: MOI, diagnosis, prognosis, anatomy, exercise progression, DOMS expectations, muscle firing,  envelope of function, HEP, POC   Person educated: Patient Education method: Explanation, Demonstration, Tactile cues, Verbal cues, and Handouts Education comprehension: verbalized understanding, returned demonstration, verbal cues required, and tactile cues required   HOME EXERCISE PROGRAM:  Access [facility]: 2[facility]: [email or web address] Date: 08/01/2022 Prepared by: ADaleen Bo  ASSESSMENT:   CLINICAL IMPRESSION: Patient is a 67y.o. female who was seen today for physical therapy evaluation and treatment for c/c of chronic R  knee pain. Pt's s/s appear consistent with history of R knee OA in addition to joint stiffness restrictions and weakness causing pain. Pt's pain is minimally sensitive and irritable with movement. Plan to continue with R knee flexion/extension strength and R hip motor control at future sessions. Pt is very high functioning and already exercising regularly to loose weight and stay active. Pt will need infrequent visits to establish aquatic and gym based HEP. Pt would benefit from continued skilled therapy in order to reach goals and maximize functional L LE strength and ROM for prevention of further functional decline and surgical intervention.     Pt's upcoming bladder implant surgery and surgical healing likely to impact POC and scheduling of visits.  OBJECTIVE IMPAIRMENTS decreased mobility, difficulty walking,  decreased ROM, decreased strength, increased muscle spasms, improper body mechanics, postural dysfunction, and pain.    ACTIVITY LIMITATIONS lifting, sitting, standing, squatting, stairs, transfers, locomotion level   PARTICIPATION LIMITATIONS: cleaning, laundry, driving, shopping, community activity, occupation, yard work, and exercise   PERSONAL FACTORS Age, Fitness, Time since onset of injury/illness/exacerbation, and 3+ comorbidity:    are also affecting patient's functional outcome.    REHAB POTENTIAL: good   CLINICAL DECISION MAKING: stable/uncomplicated   EVALUATION COMPLEXITY: low     GOALS:     SHORT TERM GOALS: Target date: 09/12/2022      Pt will become independent with HEP in order to demonstrate synthesis of PT education..   Goal status: INITIAL   2.  STG FOTO to be set when able.      Goal status: INITIAL  3.  Pt will be able to demonstrate/report ability to sit/stand/sleep for extended periods of time without pain in order to demonstrate functional improvement and tolerance to static positioning.    Goal status: INITIAL     LONG TERM GOALS: Target date: 10/24/2022      Pt  will become independent with final HEP in order to demonstrate synthesis of PT education.   Goal status: INITIAL   2.  FOTO LTG to be set when able    Goal status: INITIAL   3.  Pt will be able to demonstrate squat to at least parallel in order to demonstrate functional improvement in LE function for self-care and house hold duties.    Goal status: INITIAL   4.  Pt will be able to demonstrate reciprocal stair management without pain in order to demonstrate functional improvement in LE function for self-care and house hold duties. .    Goal status: INITIAL       PLAN: PT FREQUENCY: 1x/week (likely every other)   PT DURATION: 12 weeks (likely DC in 8-10wks)   PLANNED INTERVENTIONS: Therapeutic exercises, Therapeutic activity, Neuromuscular re-education, Balance training,  Gait training, Patient/Family education, Self Care, and Joint mobilization   PLAN FOR NEXT SESSION: continue with R quad and hip strength, aquatics for R knee strength and stability; pt is very high functioning. 3x pool visit to establish HEP and then 3x land visits for gym  Daleen Bo, PT 08/01/2022, 4:36 PM

## 2022-08-01 ENCOUNTER — Encounter (HOSPITAL_BASED_OUTPATIENT_CLINIC_OR_DEPARTMENT_OTHER): Payer: Self-pay | Admitting: Physical Therapy

## 2022-08-01 ENCOUNTER — Ambulatory Visit (HOSPITAL_BASED_OUTPATIENT_CLINIC_OR_DEPARTMENT_OTHER): Payer: Medicare Other | Attending: Family Medicine | Admitting: Physical Therapy

## 2022-08-01 ENCOUNTER — Other Ambulatory Visit: Payer: Self-pay

## 2022-08-01 DIAGNOSIS — M6281 Muscle weakness (generalized): Secondary | ICD-10-CM

## 2022-08-01 DIAGNOSIS — M1711 Unilateral primary osteoarthritis, right knee: Secondary | ICD-10-CM | POA: Diagnosis not present

## 2022-08-01 DIAGNOSIS — M25561 Pain in right knee: Secondary | ICD-10-CM

## 2022-08-01 DIAGNOSIS — R262 Difficulty in walking, not elsewhere classified: Secondary | ICD-10-CM | POA: Insufficient documentation

## 2022-08-03 ENCOUNTER — Encounter: Payer: Self-pay | Admitting: Bariatrics

## 2022-08-03 ENCOUNTER — Ambulatory Visit (INDEPENDENT_AMBULATORY_CARE_PROVIDER_SITE_OTHER): Payer: Medicare Other | Admitting: Bariatrics

## 2022-08-03 ENCOUNTER — Ambulatory Visit: Payer: Medicare Other | Admitting: Internal Medicine

## 2022-08-03 VITALS — BP 124/75 | HR 82 | Temp 97.5°F | Ht 66.0 in | Wt 251.0 lb

## 2022-08-03 DIAGNOSIS — Z6841 Body Mass Index (BMI) 40.0 and over, adult: Secondary | ICD-10-CM

## 2022-08-03 DIAGNOSIS — R632 Polyphagia: Secondary | ICD-10-CM | POA: Diagnosis not present

## 2022-08-03 DIAGNOSIS — R7303 Prediabetes: Secondary | ICD-10-CM

## 2022-08-03 MED ORDER — SEMAGLUTIDE (1 MG/DOSE) 4 MG/3ML ~~LOC~~ SOPN: 1.0000 mg | PEN_INJECTOR | SUBCUTANEOUS | 0 refills | Status: DC

## 2022-08-06 ENCOUNTER — Other Ambulatory Visit: Payer: Self-pay | Admitting: Hematology and Oncology

## 2022-08-06 ENCOUNTER — Other Ambulatory Visit: Payer: Self-pay | Admitting: Internal Medicine

## 2022-08-06 DIAGNOSIS — R7303 Prediabetes: Secondary | ICD-10-CM

## 2022-08-08 ENCOUNTER — Ambulatory Visit: Payer: Medicare Other | Admitting: Family

## 2022-08-09 NOTE — Progress Notes (Signed)
Chief Complaint:   OBESITY Michelle Kennedy is here to discuss her progress with her obesity treatment plan along with follow-up of her obesity related diagnoses. Michelle Kennedy is on the Category 4 plan and states she is following her eating plan approximately 70% of the time. Michelle Kennedy states she doing some exercise with Silver Sneakers.  Today's visit was #: 15 Starting weight: 275 lbs Starting date: 07/28/2021 Today's weight: 251 lbs Today's date: 08/03/2022 Total lbs lost to date: 24 Total lbs lost since last in-office visit: +2  Interim History: She is up 2 pounds since her last visit.  She is up 2 and half pounds of water per the bioimpedance scale.  Subjective:   1. Pre-diabetes Semaglutide -Took one half dose today.  No side effects.  2. Polyphagia Ozempic helps with appetite.   Assessment/Plan:   1. Pre-diabetes 1.  Take one half dose next week and then regular dose. 2.  Refill- - Semaglutide, 1 MG/DOSE, 4 MG/3ML SOPN; Inject 1 mg into the skin once a week.  Dispense: 3 mL; Refill: 0  2. Polyphagia 1.  Keep water, fiber, and protein high.  3. Morbid obesity ([place])  BMI 40.0-44.9, adult ([place]) 1.  Increase water and keep water intake high. 2.  Keep protein and fiber intake high. 3.  Reviewed labs from 07/06/2022: CMP, lipids, vitamin D, A1c, insulin. 4.  Journal.  Michelle Kennedy is currently in the action stage of change. As such, her goal is to continue with weight loss efforts. She has agreed to keeping a food journal with goal of 1600 calories and 90-100 grams of protein daily.  Exercise goals: Walk every day.  Unable to do Silver Sneakers at this time.  Behavioral modification strategies: increasing lean protein intake, decreasing simple carbohydrates, increasing vegetables, increasing water intake, decreasing eating out, no skipping meals, meal planning and cooking strategies, keeping healthy foods in the home, and planning for success.  Michelle Kennedy has agreed to follow-up with our  clinic in 4 weeks. She was informed of the importance of frequent follow-up visits to maximize her success with intensive lifestyle modifications for her multiple health conditions.    Objective:   Blood pressure 124/75, pulse 82, temperature (!) 97.5 F (36.4 C), height 5\' 6"  (1.676 m), weight 251 lb (113.9 kg), SpO2 97 %. Body mass index is 40.51 kg/m.  General: Cooperative, alert, well developed, in no acute distress. HEENT: Conjunctivae and lids unremarkable. Cardiovascular: Regular rhythm.  Lungs: Normal work of breathing. Neurologic: No focal deficits.   Lab Results  Component Value Date   CREATININE 0.95 07/06/2022   BUN 17 07/06/2022   NA 141 07/06/2022   K 4.7 07/06/2022   CL 103 07/06/2022   CO2 22 07/06/2022   Lab Results  Component Value Date   ALT 19 07/06/2022   AST 15 07/06/2022   ALKPHOS 71 07/06/2022   BILITOT 0.4 07/06/2022   Lab Results  Component Value Date   HGBA1C 5.7 (H) 07/06/2022   HGBA1C 5.7 (A) 02/02/2022   HGBA1C 5.6 10/28/2021   HGBA1C 5.8 (H) 07/28/2021   HGBA1C 6.0 (A) 04/22/2021   Lab Results  Component Value Date   INSULIN 18.0 07/06/2022   INSULIN 25.6 (H) 07/28/2021   INSULIN 21.8 09/20/2016   Lab Results  Component Value Date   TSH 0.05 (L) 02/02/2022   Lab Results  Component Value Date   CHOL 170 07/06/2022   HDL 73 07/06/2022   LDLCALC 81 07/06/2022   LDLDIRECT 117.0 02/26/2019  TRIG 85 07/06/2022   CHOLHDL 3 06/28/2020   Lab Results  Component Value Date   VD25OH 40.4 07/06/2022   VD25OH 53.4 07/28/2021   VD25OH 41.4 09/20/2016   Lab Results  Component Value Date   WBC 5.1 07/28/2021   HGB 14.3 07/28/2021   HCT 45.0 07/28/2021   MCV 76 (L) 07/28/2021   PLT 255 07/28/2021   Lab Results  Component Value Date   IRON 71 06/23/2020   TIBC 294 06/23/2020   FERRITIN 94.6 06/23/2020    Attestation Statements:   Reviewed by clinician on day of visit: allergies, medications, problem list, medical history,  surgical history, family history, social history, and previous encounter notes.  I, [name] Whitmire, FNP-C, am acting as transcriptionist for Dr. Jearld Lesch.  I have reviewed the above documentation for accuracy and completeness, and I agree with the above. Jearld Lesch, DO

## 2022-08-10 ENCOUNTER — Ambulatory Visit (INDEPENDENT_AMBULATORY_CARE_PROVIDER_SITE_OTHER): Payer: Medicare Other | Admitting: Family

## 2022-08-10 ENCOUNTER — Encounter: Payer: Self-pay | Admitting: Family

## 2022-08-10 VITALS — BP 128/80 | HR 88 | Temp 97.8°F | Resp 12 | Ht 66.0 in | Wt 258.0 lb

## 2022-08-10 DIAGNOSIS — K219 Gastro-esophageal reflux disease without esophagitis: Secondary | ICD-10-CM | POA: Diagnosis not present

## 2022-08-10 DIAGNOSIS — I10 Essential (primary) hypertension: Secondary | ICD-10-CM

## 2022-08-10 DIAGNOSIS — L304 Erythema intertrigo: Secondary | ICD-10-CM

## 2022-08-10 DIAGNOSIS — R7303 Prediabetes: Secondary | ICD-10-CM | POA: Diagnosis not present

## 2022-08-10 DIAGNOSIS — J4540 Moderate persistent asthma, uncomplicated: Secondary | ICD-10-CM

## 2022-08-10 MED ORDER — NYSTATIN 100000 UNIT/GM EX CREA: 1.0000 | TOPICAL_CREAM | Freq: Two times a day (BID) | CUTANEOUS | 0 refills | Status: DC

## 2022-08-10 MED ORDER — TERBINAFINE HCL 250 MG PO TABS: 250.0000 mg | ORAL_TABLET | Freq: Every day | ORAL | 0 refills | Status: DC

## 2022-08-10 MED ORDER — BETAMETHASONE VALERATE 0.1 % EX OINT: 1.0000 | TOPICAL_OINTMENT | Freq: Two times a day (BID) | CUTANEOUS | 2 refills | Status: DC

## 2022-08-10 NOTE — Assessment & Plan Note (Signed)
A1C stable, continue diet/exercise/weight loss efforts.

## 2022-08-10 NOTE — Assessment & Plan Note (Signed)
Stable, management per pulmonary.

## 2022-08-10 NOTE — Progress Notes (Signed)
Subjective:     Patient ID: Michelle Kennedy, female    DOB: 1956-01-16, 67 y.o.   MRN: [id number]  Chief Complaint  Patient presents with   6 month follow up     HPI Patient is in today for   Hyperglycemia. Lab Results  Component Value Date   HGBA1C 5.7 (H) 07/06/2022   HGBA1C 5.7 (A) 02/02/2022   HGBA1C 5.6 10/28/2021   Lab Results  Component Value Date   LDLCALC 81 07/06/2022   CREATININE 0.95 07/06/2022   GERD- She continues pantoprazole.  Has symptoms about once a week.  Otherwise controlled.    Ob[facility]- [facility]  Wt Readings from Last 3 Encounters:  08/10/22 258 lb (117 kg)  08/03/22 251 lb (113.9 kg)  07/06/22 249 lb (112.9 kg)   Asthma- works with Dr. Ander Slade, occasional wheezing after going outside, resolves with albuterol prn.  HTN- Maintained on amlodipine.  BP Readings from Last 3 Encounters:  08/10/22 128/80  08/03/22 124/75  07/06/22 126/74   Hx of thyroid cancer.  Followed by Endo.   Lab Results  Component Value Date   TSH 0.05 (L) 02/02/2022     Health Maintenance Due  Topic Date Due   Medicare Annual Wellness (AWV)  Never done   COVID-19 Vaccine (8 - 2023-24 season) 01/20/2022    Past Medical History:  Diagnosis Date   Abnormally small mouth    Achilles tendon disorder, right    Anemia    Anxiety    Arthritis    hips and knees   Asthma    daily inhaler, prn inhaler and neb.   Asthma due to environmental allergies    Basal cell carcinoma    nose, s/p mohs.   Breast cancer ([place]) 01/2014   left   Chest pain    CHF (congestive heart failure) (HCC)    COPD (chronic obstructive pulmonary disease) (HCC)    Dental bridge present    upper front and lower right   Dental crowns present    x 3   Depression    Esophageal spasm    reports since her chemo and breast surgery  she developed esophageal spasms and reports this is in the past has casue her 02 to desat in the  70s , denies sycnope in  relation to this , does report hx of vertigo as well    Family history of anesthesia complication    twin brother aspirated and died on OR table, per pt.   Fibromyalgia    Gallbladder disease    Gallstones    GERD (gastroesophageal reflux disease)    Glaucoma    H/O blood clots    had blood to  PICC line    History of gastric ulcer    as a teenager   History of seizure age 66   as a reaction to Penicillin - no seizures since   History of stomach ulcers    History of thyroid cancer    s/p thyroidectomy   HTN (hypertension)    Hyperlipidemia    Hypothyroidism    Joint pain    Left knee injury    Liver disorder    seen when she had her hysterectomy , reports she was told it was  " small lesion" ; but asymptomatic    Lymphedema    left arm   Migraines    Multiple food allergies    Obesity    OSA (obstructive sleep apnea)  02/16/2016   CPAP   Palpitations    reports no longer experiences   Personal history of chemotherapy 2015   Personal history of radiation therapy 2015   Left   Prediabetes    Rheumatoid arthritis ([place])    SOB (shortness of breath)    Swelling of both ankles    Tinnitus    UTI (lower urinary tract infection) 02/17/2014   Vertigo    last episode  was 2 weeks ago    Vitamin D deficiency    Wears contact lenses    left eye only    Wears hearing aid in both ears     Past Surgical History:  Procedure Laterality Date   ABDOMINAL HYSTERECTOMY  2004   complete   ACHILLES TENDON REPAIR Right    APPENDECTOMY  2004   BREAST EXCISIONAL BIOPSY     BREAST LUMPECTOMY Left    2015   BREAST LUMPECTOMY WITH NEEDLE LOCALIZATION AND AXILLARY SENTINEL LYMPH NODE BX Left 02/23/2014   Procedure: BREAST LUMPECTOMY WITH NEEDLE LOCALIZATION AND AXILLARY SENTINEL LYMPH NODE BIOPSY;  Surgeon: Excell Seltzer, MD;  Location: [facility];  Service: General;  Laterality: Left;   BREAST SURGERY  2011   left breast biposy   CHOLECYSTECTOMY  1990   COLONOSCOPY  W/ POLYPECTOMY  06/2009   EYE SURGERY Right 2013   exc. warts from underneath eyelid   INCONTINENCE SURGERY  2004   KNEE ARTHROSCOPY Bilateral    x 6 each knee   LIGAMENT REPAIR Right    thumb/wrist   ORIF TOE FRACTURE Right    great toe   PORTACATH PLACEMENT N/A 03/24/2014   Procedure: INSERTION PORT-A-CATH;  Surgeon: Excell Seltzer, MD;  Location: [facility];  Service: General;  Laterality: N/A;; removed    THYROIDECTOMY  2000   TONSILLECTOMY AND ADENOIDECTOMY  2000   TOTAL KNEE ARTHROPLASTY Left 12/03/2017   Procedure: LEFT TOTAL KNEE ARTHROPLASTY;  Surgeon: Gaynelle Arabian, MD;  Location: [facility];  Service: Orthopedics;  Laterality: Left;   TUMOR EXCISION     from thoracic spine    Family History  Problem Relation Age of Onset   Alcohol abuse Mother    Arthritis Mother    Hypertension Mother    Bipolar disorder Mother    Breast cancer Mother 78       unconfirmed   Lung cancer Mother 17       smoker   Hyperlipidemia Mother    Stroke Mother    Cancer Mother    Depression Mother    Anxiety disorder Mother    Alcoholism Mother    Drug abuse Mother    Eating disorder Mother    Obesity Mother    Alcohol abuse Father    Hyperlipidemia Father    Kidney disease Father    Diabetes Father    Hypertension Father    Cystic kidney disease Father    Thyroid disease Father    Liver disease Father    Alcoholism Father    Lung cancer Father    Thyroid cancer Sister 56       type?; currently 81   Other Sister        ovarian tumor @ 82; TAH/BSO   Breast cancer Sister    Thyroid cancer Brother        dx 61s; currently 29   Breast cancer Maternal Aunt        dx 75s; deceased 11   Thyroid cancer Paternal 49  All 3 paternal aunts with thyroid ca in 30s/40s   Lung cancer Paternal Aunt        2 of 3 paternal aunts with lung cancer   Arthritis Maternal Grandmother    Diabetes Paternal Grandmother    Heart disease Other    COPD Other    Asthma Other      Social History   Socioeconomic History   Marital status: Married    Spouse name: [name]   Number of children: 2   Years of education: Not on file   Highest education level: Not on file  Occupational History   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   Social Determinants of Health   Financial Resource Strain: Not on file  Food Insecurity: No Food Insecurity (01/12/2021)   Hunger Vital Sign    Worried About Running Out of Food in the Last Year: Never true    Ran Out of Food in the Last Year: Never true  Transportation Needs: Not on file  Physical Activity: Not on file  Stress: Not on file  Social Connections: Not on file  Intimate Partner Violence: Not on file    Outpatient Medications Prior to Visit  Medication Sig Dispense Refill   albuterol (PROVENTIL) (5 MG/ML) 0.5% nebulizer solution Take 0.5 mLs (2.5 mg total) by nebulization every 6 (six) hours as needed for wheezing or shortness of breath. 40 mL 3   albuterol (VENTOLIN HFA) 108 (90 Base) MCG/ACT inhaler USE 2 INHALATIONS ORALLY   EVERY 6 HOURS AS NEEDED FORWHEEZING OR SHORTNESS OF   BREATH. 54 g 1   amLODipine (NORVASC) 5 MG tablet Take 1 tablet (5 mg total) by mouth daily. 90 tablet 1   anastrozole (ARIMIDEX) 1 MG tablet TAKE 1 TABLET (1 MG TOTAL) BY MOUTH DAILY. 90 tablet 0   CALCIUM PO Take 1 tablet by mouth every evening.     cholecalciferol (VITAMIN D3) 25 MCG (1000 UNIT) tablet Take 1,000 Units by mouth  every evening.      clopidogrel (PLAVIX) 75 MG tablet TAKE 1 TABLET EVERY DAY 90 tablet 1   EPINEPHrine 0.3 mg/0.3 mL IJ SOAJ injection Inject 0.3 mg into the muscle as needed for anaphylaxis. 2 each 1   fluticasone-salmeterol (WIXELA INHUB) 250-50 MCG/ACT AEPB Inhale 1 puff into the lungs in the morning and at bedtime. 180 each 6   furosemide (LASIX) 20 MG tablet TAKE 1 TABLET(20 MG) BY MOUTH DAILY AS NEEDED FOR SWELLING 30 tablet 3   gabapentin (NEURONTIN) 100 MG capsule TAKE 2 CAPSULES THREE TIMES DAILY 540 capsule 3   GEMTESA 75 MG TABS Take 1 tablet by mouth daily.     latanoprost (XALATAN) 0.005 % ophthalmic solution 1 drop at bedtime.     levothyroxine (SYNTHROID) 150 MCG tablet Take 1 tablet (150 mcg total) by mouth daily. 45 tablet 3  lisinopril (ZESTRIL) 20 MG tablet TAKE 2 TABLETS EVERY DAY 180 tablet 1   meclizine (ANTIVERT) 25 MG tablet Take 1 tablet (25 mg total) by mouth 3 (three) times daily as needed for dizziness. 30 tablet 0   metFORMIN (GLUCOPHAGE) 500 MG tablet Take 1 tablet (500 mg total) by mouth daily with breakfast. 90 tablet 0   methocarbamol (ROBAXIN) 500 MG tablet Take 1 tablet (500 mg total) by mouth every 8 (eight) hours as needed for muscle spasms. 20 tablet 0   Multiple Vitamin (MULTIVITAMIN ADULT) TABS Take 1 tablet by mouth daily.     Multiple Vitamins-Minerals (OCUVITE PO) Take 1 tablet by mouth daily.     Omega-3 Fatty Acids (FISH OIL) 1000 MG CAPS Take 1 capsule by mouth every evening.      ondansetron (ZOFRAN) 4 MG tablet Take 1 tablet (4 mg total) by mouth every 8 (eight) hours as needed for nausea or vomiting. 20 tablet 0   pantoprazole (PROTONIX) 40 MG tablet TAKE 1 TABLET EVERY DAY 90 tablet 1   rosuvastatin (CRESTOR) 5 MG tablet Take 1 tablet (5 mg total) by mouth daily. 90 tablet 3   Semaglutide, 1 MG/DOSE, 4 MG/3ML SOPN Inject 1 mg into the skin once a week. 3 mL 0   traMADol (ULTRAM) 50 MG tablet Take 1 tablet (50 mg total) by mouth every 6 (six)  hours as needed for moderate pain or severe pain. 20 tablet 0   doxycycline (VIBRA-TABS) 100 MG tablet Take 1 tablet (100 mg total) by mouth 2 (two) times daily. 14 tablet 0   predniSONE (DELTASONE) 10 MG tablet 4 tabs by mouth once daily for 2 days, then 3 tabs daily x 2 days, then 2 tabs daily x 2 days, then 1 tab daily x 2 days 20 tablet 0   No facility-administered medications prior to visit.    Allergies  Allergen Reactions   Bee Venom Anaphylaxis   Betadine [Povidone Iodine] Anaphylaxis    Rash. Breathing problems.    Codeine Shortness Of Breath and Rash   Contrast Media [Iodinated Contrast Media] Shortness Of Breath   Gadolinium Derivatives Hives, Rash, Shortness Of Breath and Swelling    "my heart stopped beating"    Iodine Other (See Comments)    CARDIAC ARREST   Latex Anaphylaxis and Rash   Lidocaine Shortness Of Breath and Swelling    SWELLING OF MOUTH AND THROAT, low BP   Penicillins Shortness Of Breath, Rash and Other (See Comments)    SEIZURE Did it involve swelling of the face/tongue/throat, SOB, or low BP? no Did it involve sudden or severe rash/hives, skin peeling, or any reaction on the inside of your mouth or nose? yes Did you need to seek medical attention at a hospital or doctor's office? yes When did it last happen?      2010 If all above answers are "NO", may proceed with cephalosporin use.    Pentazocine Lactate Shortness Of Breath and Rash   Shellfish Allergy Shortness Of Breath and Rash   Aspirin Rash and Other (See Comments)    GI UPSET   Erythromycin Swelling and Rash    SWELLING OF JOINTS   Symbicort [Budesonide-Formoterol Fumarate] Other (See Comments)    BURNING OF TONGUE AND LIPS   Compazine [Prochlorperazine Maleate] Rash    Rash on face,chest, arms, back   Sulfonamide Derivatives Rash    ROS    See HPI Objective:    Physical Exam Vitals reviewed.  Constitutional:  General: She is not in acute distress.    Appearance: Normal  appearance. She is well-developed.  HENT:     Head: Normocephalic and atraumatic.     Right Ear: External ear normal.     Left Ear: External ear normal.  Eyes:     General: No scleral icterus. Neck:     Thyroid: No thyromegaly.  Cardiovascular:     Rate and Rhythm: Normal rate and regular rhythm.     Heart sounds: Normal heart sounds. No murmur heard. Pulmonary:     Effort: Pulmonary effort is normal. No respiratory distress.     Breath sounds: Normal breath sounds. No wheezing.  Abdominal:     Comments: Skin rash beneath abdominal skin fold   Musculoskeletal:     Cervical back: Neck supple.  Skin:    General: Skin is warm and dry.  Neurological:     Mental Status: She is alert and oriented to person, place, and time.  Psychiatric:        Mood and Affect: Mood normal.        Behavior: Behavior normal.        Thought Content: Thought content normal.        Judgment: Judgment normal.     BP 128/80 (BP Location: Right Arm, Cuff Size: Large)   Pulse 88   Temp 97.8 F (36.6 C) (Oral)   Resp 12   Ht 5\' 6"  (1.676 m)   Wt 258 lb (117 kg)   SpO2 98%   BMI 41.64 kg/m  Wt Readings from Last 3 Encounters:  08/10/22 258 lb (117 kg)  08/03/22 251 lb (113.9 kg)  07/06/22 249 lb (112.9 kg)       Assessment & Plan:   Problem List Items Addressed This Visit       Unprioritized   Prediabetes    A1C stable, continue diet/exercise/weight loss efforts.       GERD    Stable on PPI.  Continue pantoprazole.       Essential hypertension    BP stable on amlodipine. Continue same.       Asthma    Stable, management per pulmonary.       Other Visit Diagnoses     Intertrigo    -  Primary   Relevant Medications   nystatin cream (MYCOSTATIN)       I have discontinued Marisa Severin. Weinkauf "[name]"'s predniSONE and doxycycline. I am also having her start on betamethasone valerate ointment, terbinafine, and nystatin cream. Additionally, I am having her maintain her CALCIUM PO,  cholecalciferol, Fish Oil, Multivitamin Adult, Multiple Vitamins-Minerals (OCUVITE PO), Gemtesa, albuterol, latanoprost, furosemide, rosuvastatin, ondansetron, pantoprazole, albuterol, EPINEPHrine, clopidogrel, fluticasone-salmeterol, meclizine, levothyroxine, amLODipine, traMADol, methocarbamol, gabapentin, lisinopril, Semaglutide (1 MG/DOSE), metFORMIN, and anastrozole.  Meds ordered this encounter  Medications   betamethasone valerate ointment (VALISONE) 0.1 %    Sig: Apply 1 Application topically 2 (two) times daily.    Dispense:  30 g    Refill:  2    Order Specific Question:   Supervising Provider    Answer:   Penni Homans A [[id number]]   terbinafine (LAMISIL) 250 MG tablet    Sig: Take 1 tablet (250 mg total) by mouth daily.    Dispense:  30 tablet    Refill:  0    Order Specific Question:   Supervising Provider    Answer:   Penni Homans A [[id number]]   nystatin cream (MYCOSTATIN)    Sig: Apply 1 Application topically  2 (two) times daily.    Dispense:  30 g    Refill:  0    Order Specific Question:   Supervising Provider    Answer:   Penni Homans A [id number]

## 2022-08-10 NOTE — Assessment & Plan Note (Signed)
Stable on PPI.  Continue pantoprazole.

## 2022-08-10 NOTE — Assessment & Plan Note (Signed)
BP stable on amlodipine. Continue same.  

## 2022-08-11 ENCOUNTER — Telehealth: Payer: Self-pay | Admitting: Hematology and Oncology

## 2022-08-11 NOTE — Telephone Encounter (Signed)
Scheduled appointment per staff message. Left voicemail. 

## 2022-08-15 ENCOUNTER — Telehealth: Payer: Self-pay | Admitting: Family

## 2022-08-15 NOTE — Telephone Encounter (Signed)
[place] to schedule their annual wellness visit. Appointment made for 08/24/2022.  Sherol Dade; [facility] Direct Dial: [phone]

## 2022-08-16 ENCOUNTER — Encounter: Payer: Self-pay | Admitting: Bariatrics

## 2022-08-24 ENCOUNTER — Ambulatory Visit (INDEPENDENT_AMBULATORY_CARE_PROVIDER_SITE_OTHER): Payer: Medicare Other | Admitting: *Deleted

## 2022-08-24 VITALS — BP 143/82 | HR 66 | Ht 66.0 in | Wt 248.0 lb

## 2022-08-24 DIAGNOSIS — Z0000 Encounter for general adult medical examination without abnormal findings: Secondary | ICD-10-CM | POA: Diagnosis not present

## 2022-08-24 NOTE — Patient Instructions (Signed)
[name] , Thank you for taking time to come for your Medicare Wellness Visit. I appreciate your ongoing commitment to your health goals. Please review the following plan we discussed and let me know if I can assist you in the future.   These are the goals we discussed:  Goals   None     This is a list of the screening recommended for you and due dates:  Health Maintenance  Topic Date Due   COVID-19 Vaccine (8 - 2023-24 season) 01/20/2022   Flu Shot  12/21/2022   Cologuard (Stool DNA test)  08/20/2023   Medicare Annual Wellness Visit  08/24/2023   Mammogram  12/22/2023   DTaP/Tdap/Td vaccine (3 - Td or Tdap) 08/12/2029   Pneumonia Vaccine  Completed   DEXA scan (bone density measurement)  Completed   Hepatitis C Screening: USPSTF Recommendation to screen - Ages 14-79 yo.  Completed   Zoster (Shingles) Vaccine  Completed   HPV Vaccine  Aged Out   Colon Cancer Screening  Discontinued     Next appointment: Follow up in one year for your annual wellness visit.   Preventive Care 51 Years and Older, Female Preventive care refers to lifestyle choices and visits with your health care provider that can promote health and wellness. What does preventive care include? A yearly physical exam. This is also called an annual well check. Dental exams once or twice a year. Routine eye exams. Ask your health care provider how often you should have your eyes checked. Personal lifestyle choices, including: Daily care of your teeth and gums. Regular physical activity. Eating a healthy diet. Avoiding tobacco and drug use. Limiting alcohol use. Practicing safe sex. Taking low-dose aspirin every day. Taking vitamin and mineral supplements as recommended by your health care provider. What happens during an annual well check? The services and screenings done by your health care provider during your annual well check will depend on your age, overall health, lifestyle risk factors, and family  history of disease. Counseling  Your health care provider may ask you questions about your: Alcohol use. Tobacco use. Drug use. Emotional well-being. Home and relationship well-being. Sexual activity. Eating habits. History of falls. Memory and ability to understand (cognition). Work and work Statistician. Reproductive health. Screening  You may have the following tests or measurements: Height, weight, and BMI. Blood pressure. Lipid and cholesterol levels. These may be checked every 5 years, or more frequently if you are over 40 years old. Skin check. Lung cancer screening. You may have this screening every year starting at age 73 if you have a 30-pack-year history of smoking and currently smoke or have quit within the past 15 years. Fecal occult blood test (FOBT) of the stool. You may have this test every year starting at age 69. Flexible sigmoidoscopy or colonoscopy. You may have a sigmoidoscopy every 5 years or a colonoscopy every 10 years starting at age 57. Hepatitis C blood test. Hepatitis B blood test. Sexually transmitted disease (STD) testing. Diabetes screening. This is done by checking your blood sugar (glucose) after you have not eaten for a while (fasting). You may have this done every 1-3 years. Bone density scan. This is done to screen for osteoporosis. You may have this done starting at age 38. Mammogram. This may be done every 1-2 years. Talk to your health care provider about how often you should have regular mammograms. Talk with your health care provider about your test results, treatment options, and if necessary, the need  for more tests. Vaccines  Your health care provider may recommend certain vaccines, such as: Influenza vaccine. This is recommended every year. Tetanus, diphtheria, and acellular pertussis (Tdap, Td) vaccine. You may need a Td booster every 10 years. Zoster vaccine. You may need this after age 12. Pneumococcal 13-valent conjugate (PCV13)  vaccine. One dose is recommended after age 82. Pneumococcal polysaccharide (PPSV23) vaccine. One dose is recommended after age 49. Talk to your health care provider about which screenings and vaccines you need and how often you need them. This information is not intended to replace advice given to you by your health care provider. Make sure you discuss any questions you have with your health care provider. Document Released: 06/04/2015 Document Revised: 01/26/2016 Document Reviewed: 03/09/2015 Elsevier Interactive Patient Education  2017 [place] Prevention in the Home Falls can cause injuries. They can happen to people of all ages. There are many things you can do to make your home safe and to help prevent falls. What can I do on the outside of my home? Regularly fix the edges of walkways and driveways and fix any cracks. Remove anything that might make you trip as you walk through a door, such as a raised step or threshold. Trim any bushes or trees on the path to your home. Use bright outdoor lighting. Clear any walking paths of anything that might make someone trip, such as rocks or tools. Regularly check to see if handrails are loose or broken. Make sure that both sides of any steps have handrails. Any raised decks and porches should have guardrails on the edges. Have any leaves, snow, or ice cleared regularly. Use sand or salt on walking paths during winter. Clean up any spills in your garage right away. This includes oil or grease spills. What can I do in the bathroom? Use night lights. Install grab bars by the toilet and in the tub and shower. Do not use towel bars as grab bars. Use non-skid mats or decals in the tub or shower. If you need to sit down in the shower, use a plastic, non-slip stool. Keep the floor dry. Clean up any water that spills on the floor as soon as it happens. Remove soap buildup in the tub or shower regularly. Attach bath mats securely with  double-sided non-slip rug tape. Do not have throw rugs and other things on the floor that can make you trip. What can I do in the bedroom? Use night lights. Make sure that you have a light by your bed that is easy to reach. Do not use any sheets or blankets that are too big for your bed. They should not hang down onto the floor. Have a firm chair that has side arms. You can use this for support while you get dressed. Do not have throw rugs and other things on the floor that can make you trip. What can I do in the kitchen? Clean up any spills right away. Avoid walking on wet floors. Keep items that you use a lot in easy-to-reach places. If you need to reach something above you, use a strong step stool that has a grab bar. Keep electrical cords out of the way. Do not use floor polish or wax that makes floors slippery. If you must use wax, use non-skid floor wax. Do not have throw rugs and other things on the floor that can make you trip. What can I do with my stairs? Do not leave any items on the stairs.  Make sure that there are handrails on both sides of the stairs and use them. Fix handrails that are broken or loose. Make sure that handrails are as long as the stairways. Check any carpeting to make sure that it is firmly attached to the stairs. Fix any carpet that is loose or worn. Avoid having throw rugs at the top or bottom of the stairs. If you do have throw rugs, attach them to the floor with carpet tape. Make sure that you have a light switch at the top of the stairs and the bottom of the stairs. If you do not have them, ask someone to add them for you. What else can I do to help prevent falls? Wear shoes that: Do not have high heels. Have rubber bottoms. Are comfortable and fit you well. Are closed at the toe. Do not wear sandals. If you use a stepladder: Make sure that it is fully opened. Do not climb a closed stepladder. Make sure that both sides of the stepladder are locked  into place. Ask someone to hold it for you, if possible. Clearly mark and make sure that you can see: Any grab bars or handrails. First and last steps. Where the edge of each step is. Use tools that help you move around (mobility aids) if they are needed. These include: Canes. Walkers. Scooters. Crutches. Turn on the lights when you go into a dark area. Replace any light bulbs as soon as they burn out. Set up your furniture so you have a clear path. Avoid moving your furniture around. If any of your floors are uneven, fix them. If there are any pets around you, be aware of where they are. Review your medicines with your doctor. Some medicines can make you feel dizzy. This can increase your chance of falling. Ask your doctor what other things that you can do to help prevent falls. This information is not intended to replace advice given to you by your health care provider. Make sure you discuss any questions you have with your health care provider. Document Released: 03/04/2009 Document Revised: 10/14/2015 Document Reviewed: 06/12/2014 Elsevier Interactive Patient Education  2017 [employer].

## 2022-08-24 NOTE — Progress Notes (Signed)
Subjective:  Pt completed ADLs, Fall risk, and SDOH during e-check in on 08/23/22.  Answers verified with pt.    Clarene Kennedy is a 67 y.o. female who presents for an Initial Medicare Annual Wellness Visit.  I connected with  Michelle Kennedy on 08/24/22 by a audio enabled telemedicine application and verified that I am speaking with the correct person using two identifiers.  Patient Location: Home  Provider Location: Office/Clinic  I discussed the limitations of evaluation and management by telemedicine. The patient expressed understanding and agreed to proceed.   Review of Systems     Cardiac Risk Factors include: advanced age (>30men, >38 women);dyslipidemia;hypertension;obesity (BMI >30kg/m2)     Objective:    Today's Vitals   08/24/22 0943 08/24/22 0958  BP: (!) 142/89 (!) 143/82  Pulse: 71 66  Weight: 248 lb (112.5 kg)   Height: 5\' 6"  (1.676 m)    Body mass index is 40.03 kg/m.     08/24/2022    9:42 AM 08/01/2022    3:48 PM 08/30/2021   10:24 AM 01/12/2021    8:58 AM 07/20/2020    8:43 PM 05/20/2020   10:40 AM 01/12/2020   10:55 AM  Advanced Directives  Does Patient Have a Medical Advance Directive? Yes Yes Yes No Yes Yes Yes  Type of Paramedic of [place];Living will [place];Living will Living will  Healthcare Power of [place];Living will Living will  Does patient want to make changes to medical advance directive? No - Patient declined    No - Patient declined    Copy of [place] in Chart? No - copy requested    No - copy requested    Would patient like information on creating a medical advance directive?    No - Patient declined       Current Medications (verified) Outpatient Encounter Medications as of 08/24/2022  Medication Sig   albuterol (PROVENTIL) (5 MG/ML) 0.5% nebulizer solution Take 0.5 mLs (2.5 mg total) by nebulization every 6 (six) hours as needed for  wheezing or shortness of breath.   albuterol (VENTOLIN HFA) 108 (90 Base) MCG/ACT inhaler USE 2 INHALATIONS ORALLY   EVERY 6 HOURS AS NEEDED FORWHEEZING OR SHORTNESS OF   BREATH.   amLODipine (NORVASC) 5 MG tablet Take 1 tablet (5 mg total) by mouth daily.   anastrozole (ARIMIDEX) 1 MG tablet TAKE 1 TABLET (1 MG TOTAL) BY MOUTH DAILY.   betamethasone valerate ointment (VALISONE) 0.1 % Apply 1 Application topically 2 (two) times daily.   CALCIUM PO Take 1 tablet by mouth every evening.   cholecalciferol (VITAMIN D3) 25 MCG (1000 UNIT) tablet Take 1,000 Units by mouth every evening.    clopidogrel (PLAVIX) 75 MG tablet TAKE 1 TABLET EVERY DAY   EPINEPHrine 0.3 mg/0.3 mL IJ SOAJ injection Inject 0.3 mg into the muscle as needed for anaphylaxis.   fluticasone-salmeterol (WIXELA INHUB) 250-50 MCG/ACT AEPB Inhale 1 puff into the lungs in the morning and at bedtime.   furosemide (LASIX) 20 MG tablet TAKE 1 TABLET(20 MG) BY MOUTH DAILY AS NEEDED FOR SWELLING   gabapentin (NEURONTIN) 100 MG capsule TAKE 2 CAPSULES THREE TIMES DAILY   GEMTESA 75 MG TABS Take 1 tablet by mouth daily.   latanoprost (XALATAN) 0.005 % ophthalmic solution 1 drop at bedtime.   levothyroxine (SYNTHROID) 150 MCG tablet Take 1 tablet (150 mcg total) by mouth daily.   lisinopril (ZESTRIL) 20 MG tablet  TAKE 2 TABLETS EVERY DAY   meclizine (ANTIVERT) 25 MG tablet Take 1 tablet (25 mg total) by mouth 3 (three) times daily as needed for dizziness.   metFORMIN (GLUCOPHAGE) 500 MG tablet Take 1 tablet (500 mg total) by mouth daily with breakfast.   methocarbamol (ROBAXIN) 500 MG tablet Take 1 tablet (500 mg total) by mouth every 8 (eight) hours as needed for muscle spasms.   Multiple Vitamin (MULTIVITAMIN ADULT) TABS Take 1 tablet by mouth daily.   Multiple Vitamins-Minerals (OCUVITE PO) Take 1 tablet by mouth daily.   nystatin cream (MYCOSTATIN) Apply 1 Application topically 2 (two) times daily.   Omega-3 Fatty Acids (FISH OIL) 1000 MG  CAPS Take 1 capsule by mouth every evening.    ondansetron (ZOFRAN) 4 MG tablet Take 1 tablet (4 mg total) by mouth every 8 (eight) hours as needed for nausea or vomiting.   pantoprazole (PROTONIX) 40 MG tablet TAKE 1 TABLET EVERY DAY   rosuvastatin (CRESTOR) 5 MG tablet Take 1 tablet (5 mg total) by mouth daily.   Semaglutide, 1 MG/DOSE, 4 MG/3ML SOPN Inject 1 mg into the skin once a week.   terbinafine (LAMISIL) 250 MG tablet Take 1 tablet (250 mg total) by mouth daily.   traMADol (ULTRAM) 50 MG tablet Take 1 tablet (50 mg total) by mouth every 6 (six) hours as needed for moderate pain or severe pain.   No facility-administered encounter medications on file as of 08/24/2022.    Allergies (verified) Bee venom, Betadine [povidone iodine], Codeine, Contrast media [iodinated contrast media], Gadolinium derivatives, Iodine, Latex, Lidocaine, Penicillins, Pentazocine lactate, Shellfish allergy, Aspirin, Erythromycin, Symbicort [budesonide-formoterol fumarate], Compazine [prochlorperazine maleate], and Sulfonamide derivatives   History: Past Medical History:  Diagnosis Date   Abnormally small mouth    Achilles tendon disorder, right    Allergy    Anemia    Anxiety    Arthritis    hips and knees   Asthma    daily inhaler, prn inhaler and neb.   Asthma due to environmental allergies    Basal cell carcinoma    nose, s/p mohs.   Breast cancer 01/2014   left   Cataract    Chest pain    CHF (congestive heart failure)    COPD (chronic obstructive pulmonary disease)    Dental bridge present    upper front and lower right   Dental crowns present    x 3   Depression    Esophageal spasm    reports since her chemo and breast surgery  she developed esophageal spasms and reports this is in the past has casue her 02 to desat in the  70s , denies sycnope in relation to this , does report hx of vertigo as well    Family history of anesthesia complication    twin brother aspirated and died on OR  table, per pt.   Fibromyalgia    Gallbladder disease    Gallstones    GERD (gastroesophageal reflux disease)    Glaucoma    H/O blood clots    had blood to  PICC line    History of gastric ulcer    as a teenager   History of seizure age 41   as a reaction to Penicillin - no seizures since   History of stomach ulcers    History of thyroid cancer    s/p thyroidectomy   HTN (hypertension)    Hyperlipidemia    Hypothyroidism    Joint pain  Left knee injury    Liver disorder    seen when she had her hysterectomy , reports she was told it was  " small lesion" ; but asymptomatic    Lymphedema    left arm   Migraines    Multiple food allergies    Obesity    OSA (obstructive sleep apnea) 02/16/2016   CPAP   Palpitations    reports no longer experiences   Personal history of chemotherapy 2015   Personal history of radiation therapy 2015   Left   Prediabetes    Rheumatoid arthritis    Sleep apnea    SOB (shortness of breath)    Swelling of both ankles    Tinnitus    UTI (lower urinary tract infection) 02/17/2014   Vertigo    last episode  was 2 weeks ago    Vitamin D deficiency    Wears contact lenses    left eye only    Wears hearing aid in both ears    Past Surgical History:  Procedure Laterality Date   ABDOMINAL HYSTERECTOMY  2004   complete   ACHILLES TENDON REPAIR Right    APPENDECTOMY  2004   BREAST EXCISIONAL BIOPSY     BREAST LUMPECTOMY Left    2015   BREAST LUMPECTOMY WITH NEEDLE LOCALIZATION AND AXILLARY SENTINEL LYMPH NODE BX Left 02/23/2014   Procedure: BREAST LUMPECTOMY WITH NEEDLE LOCALIZATION AND AXILLARY SENTINEL LYMPH NODE BIOPSY;  Surgeon: Excell Seltzer, MD;  Location: [facility];  Service: General;  Laterality: Left;   BREAST SURGERY  2011   left breast biposy   CHOLECYSTECTOMY  1990   COLONOSCOPY W/ POLYPECTOMY  06/2009   EYE SURGERY Right 2013   exc. warts from underneath eyelid   INCONTINENCE SURGERY  2004   KNEE  ARTHROSCOPY Bilateral    x 6 each knee   LIGAMENT REPAIR Right    thumb/wrist   ORIF TOE FRACTURE Right    great toe   PORTACATH PLACEMENT N/A 03/24/2014   Procedure: INSERTION PORT-A-CATH;  Surgeon: Excell Seltzer, MD;  Location: [facility];  Service: General;  Laterality: N/A;; removed    THYROIDECTOMY  2000   TONSILLECTOMY AND ADENOIDECTOMY  2000   TOTAL KNEE ARTHROPLASTY Left 12/03/2017   Procedure: LEFT TOTAL KNEE ARTHROPLASTY;  Surgeon: Gaynelle Arabian, MD;  Location: [facility];  Service: Orthopedics;  Laterality: Left;   TUBAL LIGATION  1981   TUMOR EXCISION     from thoracic spine   Family History  Problem Relation Age of Onset   Alcohol abuse Mother    Arthritis Mother    Hypertension Mother    Bipolar disorder Mother    Breast cancer Mother 40       unconfirmed   Lung cancer Mother 74       smoker   Hyperlipidemia Mother    Stroke Mother    Cancer Mother    Depression Mother    Anxiety disorder Mother    Alcoholism Mother    Drug abuse Mother    Eating disorder Mother    Obesity Mother    Alcohol abuse Father    Hyperlipidemia Father    Kidney disease Father    Diabetes Father    Hypertension Father    Cystic kidney disease Father    Thyroid disease Father    Liver disease Father    Alcoholism Father    Lung cancer Father    Thyroid cancer Sister 33  type?; currently 18   Other Sister        ovarian tumor @ 39; TAH/BSO   Breast cancer Sister    Thyroid cancer Brother        dx 13s; currently 108   Breast cancer Maternal Aunt        dx 33s; deceased 45   Thyroid cancer Paternal 7        All 3 paternal aunts with thyroid ca in 30s/40s   Lung cancer Paternal Aunt        2 of 3 paternal aunts with lung cancer   Arthritis Maternal Grandmother    Diabetes Paternal Grandmother    Heart disease Other    COPD Other    Asthma Other    Social History   Socioeconomic History   Marital status: Married    Spouse name: [name]    Number of children: 2   Years of education: Not on file   Highest education level: Not on file  Occupational History   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   Social Determinants of Health   Financial Resource Strain: Low Risk  (08/23/2022)   Overall Financial Resource Strain (CARDIA)    Difficulty of Paying Living Expenses: Not hard at all  Food Insecurity: No Food Insecurity (08/23/2022)   Hunger Vital Sign    Worried About Running Out of Food in the Last Year: Never true    Ran Out of Food in the Last Year: Never true  Transportation Needs: No Transportation Needs (08/23/2022)   PRAPARE - Hydrologist (Medical): No    Lack of Transportation (Non-Medical): No  Physical Activity: Sufficiently Active (08/23/2022)   Exercise Vital Sign    Days of Exercise per Week: 5 days    Minutes of Exercise per Session: 40 min  Stress: No Stress Concern Present (08/23/2022)   [place]    Feeling of Stress : Not at all  Social Connections: Unknown (08/23/2022)   Social Connection and Isolation Panel [NHANES]    Frequency of Communication with Friends and Family: Once a week    Frequency of Social Gatherings with Friends and Family: Never    Attends Religious Services: Not on Advertising copywriter or Organizations: No    Attends Archivist Meetings: Never    Marital Status: Married     Tobacco Counseling Counseling given: Not Answered   Clinical Intake:  Pre-visit preparation completed: Yes  Pain : No/denies pain  BMI - recorded: 40.03 Nutritional Status: BMI > 30  Obese Nutritional Risks: None Diabetes: No  How often do you need to have someone help you when you read instructions, pamphlets, or other written materials from your doctor or pharmacy?: (P) 1 - Never   Activities of Daily  Living    08/23/2022    4:21 PM  In your present state of health, do you have any difficulty performing the following activities:  Hearing? 1  Comment wears hearing aids  Vision? 1  Difficulty concentrating or making decisions? 0  Walking or climbing stairs? 1  Dressing or bathing? 0  Doing errands, shopping? 0  Preparing Food and eating ? N  Using the Toilet? N  In the past six months, have you accidently leaked urine? Y  Do you have problems with loss of bowel control? N  Managing your Medications? N  Managing your Finances? N  Housekeeping or managing your Housekeeping? N    Patient Care Team: Debbrah Alar, NP as PCP - General (Internal Medicine) Excell Seltzer, MD (Inactive) as Consulting Physician (General Surgery) Nicholas Lose, MD as Consulting Physician (Hematology and Oncology) [name], MD as Attending Physician (Radiation Oncology) Katheran James., MD (Endocrinology) Maxwell Marion, RN as Registered Nurse (Medical Oncology) Renelda Loma, OD as Consulting Physician (Optometry)  Indicate any recent Medical Services you may have received from other than [facility] providers in the past year (date may be approximate).     Assessment:   This is a routine wellness examination for Michelle Kennedy.  Hearing/Vision screen No results found.  Dietary issues and exercise activities discussed: Current Exercise Habits: Structured exercise class;Home exercise routine, Type of exercise: treadmill;Other - see comments;yoga (Silver Sneakers twice a  week), Time (Minutes): 60, Frequency (Times/Week): 7, Weekly Exercise (Minutes/Week): 420, Intensity: Moderate, Exercise limited by: orthopedic condition(s)   Goals Addressed   None    Depression Screen    08/24/2022    9:46 AM 02/07/2022    2:29 PM 01/12/2021    8:59 AM 10/26/2020    9:34 AM 10/14/2020   10:52 AM 09/27/2020   10:18 AM 03/01/2020   12:08 PM  PHQ 2/9 Scores  PHQ - 2 Score 0 0 0 0 4 4 0  PHQ- 9 Score     9 13     Fall Risk    08/23/2022    4:21 PM 02/07/2022    2:29 PM 01/12/2021    8:58 AM 10/26/2020    9:35 AM 10/14/2020   10:16 AM  Fall Risk   Falls in the past year? 0 0 0 0 1  Number falls in past yr: 0 0 0 0 0  Injury with Fall? 0 0  0 1  Risk for fall due to : No Fall Risks    Impaired balance/gait  Follow up Falls evaluation completed    Falls evaluation completed    [place]:  Any stairs in or around the home? Yes  If so, are there any without handrails? No  Home free of loose throw rugs in walkways, pet beds, electrical cords, etc? Yes  Adequate lighting in your home to reduce risk of falls? Yes   ASSISTIVE DEVICES UTILIZED TO PREVENT FALLS:  Life alert? No  Use of a cane, walker or w/c? No  Grab bars in the bathroom? No  Shower chair or bench in shower? Yes  Elevated toilet seat or a handicapped toilet? No   TIMED UP AND GO:  Was the test performed?  No,audio visit .    Cognitive Function:        08/24/2022    9:54 AM  6CIT Screen  What Year? 0 points  What month? 0 points  What time? 0 points  Count back from  20 0 points  Months in reverse 0 points  Repeat phrase 2 points  Total Score 2 points    Immunizations Immunization History  Administered Date(s) Administered   Fluad Quad(high Dose 65+) 03/01/2020, 01/26/2021, 02/04/2022   Influenza Split 03/06/2012   Influenza,inj,Quad PF,6+ Mos 03/05/2013, 02/17/2014, 04/05/2015, 02/26/2018, 03/22/2019   Influenza-Unspecified 03/23/2011, 03/21/2016,  02/25/2017, 03/23/2019   PFIZER Comirnaty(Gray Top)Covid-19 Tri-Sucrose Vaccine 07/03/2019, 07/28/2019, 03/11/2020, 08/20/2020   PFIZER(Purple Top)SARS-COV-2 Vaccination 07/03/2019, 07/22/2019, 03/12/2020   PNEUMOCOCCAL CONJUGATE-20 01/26/2021   Pneumococcal Polysaccharide-23 03/22/2009   Td 03/22/2009   Td (Adult), 2 Lf Tetanus Toxid, Preservative Free 03/22/2009   Tdap 08/13/2019   Zoster Recombinat (Shingrix) 03/22/2020, 06/28/2020    TDAP status: Up to date  Flu Vaccine status: Up to date  Pneumococcal vaccine status: Up to date  Covid-19 vaccine status: Information provided on how to obtain vaccines.   Qualifies for Shingles Vaccine? Yes   Zostavax completed No   Shingrix Completed?: Yes  Screening Tests Health Maintenance  Topic Date Due   Medicare Annual Wellness (AWV)  Never done   COVID-19 Vaccine (8 - 2023-24 season) 01/20/2022   INFLUENZA VACCINE  12/21/2022   Fecal DNA (Cologuard)  08/20/2023   MAMMOGRAM  12/22/2023   DTaP/Tdap/Td (3 - Td or Tdap) 08/12/2029   Pneumonia Vaccine 55+ Years old  Completed   DEXA SCAN  Completed   Hepatitis C Screening  Completed   Zoster Vaccines- Shingrix  Completed   HPV VACCINES  Aged Out   COLONOSCOPY (Pts 45-20yrs Insurance coverage will need to be confirmed)  Discontinued    Health Maintenance  Health Maintenance Due  Topic Date Due   Medicare Annual Wellness (AWV)  Never done   COVID-19 Vaccine (8 - 2023-24 season) 01/20/2022    Colorectal cancer screening: Type of screening: Cologuard. Completed 08/19/20. Repeat every 3 years  Mammogram status: Completed 12/21/21. Repeat every year  Bone Density status: Completed 10/05/20. Results reflect: Bone density results: OSTEOPENIA. Repeat every 2 years.  Lung Cancer Screening: (Low Dose CT Chest recommended if Age 84-80 years, 30 pack-year currently smoking OR have quit w/in 15years.) does not qualify.    Additional Screening:  Hepatitis C Screening: does qualify;  Completed 01/17/16  Vision Screening: Recommended annual ophthalmology exams for early detection of glaucoma and other disorders of the eye. Is the patient up to date with their annual eye exam?  Yes  Who is the provider or what is the name of the office in which the patient attends annual eye exams? [facility] If pt is not established with a provider, would they like to be referred to a provider to establish care? No .   Dental Screening: Recommended annual dental exams for proper oral hygiene  Community Resource Referral / Chronic Care Management: CRR required this visit?  No   CCM required this visit?  No      Plan:     I have personally reviewed and noted the following in the patient's chart:   Medical and social history Use of alcohol, tobacco or illicit drugs  Current medications and supplements including opioid prescriptions. Patient is currently taking opioid prescriptions. Information provided to patient regarding non-opioid alternatives. Patient advised to discuss non-opioid treatment plan with their provider. Functional ability and status Nutritional status Physical activity Advanced directives List of other physicians Hospitalizations, surgeries, and ER visits in previous 12 months Vitals Screenings to include cognitive, depression, and falls Referrals and appointments  In addition, I have reviewed and discussed with patient  certain preventive protocols, quality metrics, and best practice recommendations. A written personalized care plan for preventive services as well as general preventive health recommendations were provided to patient.   Due to this being a telephonic visit, the after visit summary with patients personalized plan was offered to patient via mail or my-chart. Patient would like to access on my-chart.  Beatris Ship, [place]   08/24/2022   Nurse Notes: None

## 2022-08-25 ENCOUNTER — Ambulatory Visit (HOSPITAL_BASED_OUTPATIENT_CLINIC_OR_DEPARTMENT_OTHER): Payer: Medicare Other | Attending: Family Medicine | Admitting: Physical Therapy

## 2022-08-25 ENCOUNTER — Encounter (HOSPITAL_BASED_OUTPATIENT_CLINIC_OR_DEPARTMENT_OTHER): Payer: Self-pay | Admitting: Physical Therapy

## 2022-08-25 DIAGNOSIS — M25561 Pain in right knee: Secondary | ICD-10-CM

## 2022-08-25 DIAGNOSIS — R262 Difficulty in walking, not elsewhere classified: Secondary | ICD-10-CM | POA: Diagnosis present

## 2022-08-25 DIAGNOSIS — M6281 Muscle weakness (generalized): Secondary | ICD-10-CM | POA: Diagnosis present

## 2022-08-25 NOTE — Therapy (Signed)
OUTPATIENT PHYSICAL THERAPY LOWER EXTREMITY TREATMENT NOTE   Patient Name: Michelle Kennedy MRN: [id number] DOB:Jul 23, 1955, 67 y.o., female Today's Date: 08/25/2022  END OF SESSION:  PT End of Session - 08/25/22 1526     Visit Number 2    Number of Visits 13    Date for PT Re-Evaluation 10/30/22    Authorization Type MCR    PT Start Time 1515    PT Stop Time 1555    PT Time Calculation (min) 40 min    Activity Tolerance Patient tolerated treatment well    Behavior During Therapy WFL for tasks assessed/performed             Past Medical History:  Diagnosis Date   Abnormally small mouth    Achilles tendon disorder, right    Allergy    Anemia    Anxiety    Arthritis    hips and knees   Asthma    daily inhaler, prn inhaler and neb.   Asthma due to environmental allergies    Basal cell carcinoma    nose, s/p mohs.   Breast cancer 01/2014   left   Cataract    Chest pain    CHF (congestive heart failure)    COPD (chronic obstructive pulmonary disease)    Dental bridge present    upper front and lower right   Dental crowns present    x 3   Depression    Esophageal spasm    reports since her chemo and breast surgery  she developed esophageal spasms and reports this is in the past has casue her 02 to desat in the  70s , denies sycnope in relation to this , does report hx of vertigo as well    Family history of anesthesia complication    twin brother aspirated and died on OR table, per pt.   Fibromyalgia    Gallbladder disease    Gallstones    GERD (gastroesophageal reflux disease)    Glaucoma    H/O blood clots    had blood to  PICC line    History of gastric ulcer    as a teenager   History of seizure age 67   as a reaction to Penicillin - no seizures since   History of stomach ulcers    History of thyroid cancer    s/p thyroidectomy   HTN (hypertension)    Hyperlipidemia    Hypothyroidism    Joint pain    Left knee injury    Liver disorder    seen  when she had her hysterectomy , reports she was told it was  " small lesion" ; but asymptomatic    Lymphedema    left arm   Migraines    Multiple food allergies    Obesity    OSA (obstructive sleep apnea) 02/16/2016   CPAP   Palpitations    reports no longer experiences   Personal history of chemotherapy 2015   Personal history of radiation therapy 2015   Left   Prediabetes    Rheumatoid arthritis    Sleep apnea    SOB (shortness of breath)    Swelling of both ankles    Tinnitus    UTI (lower urinary tract infection) 02/17/2014   Vertigo    last episode  was 2 weeks ago    Vitamin D deficiency    Wears contact lenses    left eye only    Wears hearing aid in both ears  Past Surgical History:  Procedure Laterality Date   ABDOMINAL HYSTERECTOMY  2004   complete   ACHILLES TENDON REPAIR Right    APPENDECTOMY  2004   BREAST EXCISIONAL BIOPSY     BREAST LUMPECTOMY Left    2015   BREAST LUMPECTOMY WITH NEEDLE LOCALIZATION AND AXILLARY SENTINEL LYMPH NODE BX Left 02/23/2014   Procedure: BREAST LUMPECTOMY WITH NEEDLE LOCALIZATION AND AXILLARY SENTINEL LYMPH NODE BIOPSY;  Surgeon: Glenna Fellows, MD;  Location: [facility];  Service: General;  Laterality: Left;   BREAST SURGERY  2011   left breast biposy   CHOLECYSTECTOMY  1990   COLONOSCOPY W/ POLYPECTOMY  06/2009   EYE SURGERY Right 2013   exc. warts from underneath eyelid   INCONTINENCE SURGERY  2004   KNEE ARTHROSCOPY Bilateral    x 6 each knee   LIGAMENT REPAIR Right    thumb/wrist   ORIF TOE FRACTURE Right    great toe   PORTACATH PLACEMENT N/A 03/24/2014   Procedure: INSERTION PORT-A-CATH;  Surgeon: Glenna Fellows, MD;  Location: [facility];  Service: General;  Laterality: N/A;; removed    THYROIDECTOMY  2000   TONSILLECTOMY AND ADENOIDECTOMY  2000   TOTAL KNEE ARTHROPLASTY Left 12/03/2017   Procedure: LEFT TOTAL KNEE ARTHROPLASTY;  Surgeon: Ollen Gross, MD;  Location: [facility];  Service: Orthopedics;  Laterality: Left;   TUBAL LIGATION  1981   TUMOR EXCISION     from thoracic spine   Patient Active Problem List   Diagnosis Date Noted   Bronchitis 05/08/2022   Polyphagia 04/27/2022   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   Abdominal pannus 03/02/2022   Class 3 severe obesity with serious comorbidity and body mass index (BMI) of 40.0 to 44.9 in adult 03/02/2022   Vertigo 02/07/2022   Peroneal tendinitis of left lower extremity 02/07/2022   Visceral obesity 02/01/2022   Sacral pain 09/20/2021   Pain in right knee 09/13/2021   Chronic left-sided low back pain with left-sided sciatica 04/27/2021   Nondisplaced fracture of middle phalanx of left lesser toe(s), initial encounter for closed fracture 09/17/2020   Chronic diastolic CHF (congestive heart failure) 08/23/2019   Retinal artery occlusion 08/22/2019   History of total left knee replacement 12/07/2017   OA (osteoarthritis) of knee 12/03/2017   Morbid obesity 01/01/2017   Essential hypertension 10/18/2016   Constipation 10/04/2016   Vitamin D deficiency 10/04/2016   Prediabetes 10/04/2016   BMI 40.0-44.9, adult 09/20/2016   OSA (obstructive sleep apnea) 02/16/2016   Malignant neoplasm of thyroid gland 10/11/2015   De Quervain's tenosynovitis, bilateral 09/15/2015   Trigger finger, acquired 06/22/2015   Chemotherapy-induced peripheral neuropathy 05/10/2015   Postmenopausal osteoporosis 12/08/2014   Preventative health care 12/01/2014   DVT (deep venous thrombosis) 08/28/2014   Lymphedema of arm 08/28/2014   Mucositis due to chemotherapy 06/02/2014   Menopausal syndrome (hot flashes) 12/31/2013   Breast cancer of upper-outer quadrant of left female breast 12/26/2013   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   Gingivitis 12/05/2013   Seasonal allergies 09/24/2013   Hyperlipidemia 06/02/2011   Migraine 06/02/2011   Tinnitus of both ears 02/14/2011   Fibromyalgia 05/10/2010    Asthma 03/22/2010   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   GERD 12/20/2009   Depression 12/01/2009    PCP: Sandford Craze, NP   REFERRING PROVIDER:   Myra Rude, MD    REFERRING DIAG:  Diagnosis  M17.11 (ICD-10-CM) - Primary osteoarthritis of right knee  THERAPY DIAG:  Pain in joint of right knee  Difficulty walking  Muscle weakness (generalized)  Rationale for Evaluation and Treatment: Rehabilitation  ONSET DATE: 8 months ago  SUBJECTIVE:   SUBJECTIVE STATEMENT: Pt reports no new changes since evaluation  She reports she is scheduled for surgery 08/29/22; was told she had to hold PT for 5-6 wks post-op.  PERTINENT HISTORY: Gel and Zilretta injections; L TKA; bladder stimulator 3/8; fibro, HTN PAIN:  Are you having pain? Yes: NPRS scale: 4/10 Pain location: medial inferior patella  Pain description: aching Aggravating factors: stairs, inclines, especially down Relieving factors: icing, elevating, resting  PRECAUTIONS: cannot lay supine due to bladder implant  WEIGHT BEARING RESTRICTIONS: No  FALLS:  Has patient fallen in last 6 months? 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 No acute osseus changes s/p  left knee replacement without any sign of loosening   PATIENT SURVEYS:   FOTO unable due to late arrival  COGNITION: Overall cognitive status: Within functional limits for tasks assessed     SENSATION: WFL   MUSCLE LENGTH: Hamstrings: unable to lay supine due to bladder  surgery Positive Ely's on R  POSTURE: rounded shoulders and increased thoracic kyphosis  PALPATION: TTP of R knee joint line, inferio-medial patellar pole, patellar tendon; hypertonicity of L quad and HS  LOWER EXTREMITY ROM: L knee ROM lacking likely due to history of TKA  Active ROM *Right* eval Left eval  Hip flexion 110 110  Hip extension 0 0  Hip abduction    Hip adduction    Hip internal rotation    Hip external rotation    Knee flexion 125 120  Knee extension 0 -2  Ankle dorsiflexion    Ankle plantarflexion    Ankle inversion    Ankle eversion     (Blank rows = not tested)  LOWER EXTREMITY MMT:  MMT Right eval Left eval  Hip flexion    Hip extension    Hip abduction 37.7 43.8  Hip adduction    Hip internal rotation    Hip external rotation    Knee flexion 37.1 34.1  Knee extension 55.3 61.8  Ankle dorsiflexion    Ankle plantarflexion    Ankle inversion    Ankle eversion     (Blank rows = not tested)  LOWER EXTREMITY SPECIAL TESTS:  Knee special tests: Anterior drawer test: negative, Thessaly test: positive , and Step up/down test: positive   FUNCTIONAL TESTS:  5 times sit to stand: 8.7s   GAIT: Distance walked: 88ft Assistive device utilized: None Level of assistance: Complete Independence Comments: mild toe out, decreased TKE on R  Steps: able to ascend and descend reciprocally but requires UE with descent and pain with L LE lowering/ R eccentric   TODAY'S TREATMENT:  DATE: 08/25/22 Pt seen for aquatic therapy today.  Treatment took place in water 3.5-4.75 ft in depth at the [facility] pool. Temp of water was 91.  Pt entered/exited the pool via stairs independently in step-to pattern  with bilat rail. * without support:  walking forward/ backward/ side stepping / forward walking kicks * with yellow hand floats:   side step squat  L/R with arm addct; walking forward/ backward with single / bilat under water at side * side step with hydro band on below knees * squats with yellow hand float x 15 * Rt forward and lateral step ups - L retro step down x 15 each, UE on rail * bilat heel raises x 10, then bilat calf stretch * R/L quad stretch x 30s x 2 each (standing on blue step, foot on bench behind her) * R/L hamstring stretch x 30s x 2 each Pt requires the buoyancy and hydrostatic pressure of water for support, and to offload joints by unweighting joint load by at least 50 % in navel deep water and by at least 75-80% in chest to neck deep water.  Viscosity of the water is needed for resistance of strengthening. Water current perturbations provides challenge to standing balance requiring increased core activation.     PATIENT EDUCATION:  Education details: aquatic therapy progressions/modifications Person educated: Patient Education method: Explanation, Demonstration, Tactile cues, Verbal cues, and Handouts Education comprehension: verbalized understanding, returned demonstration, verbal cues required, and tactile cues required   HOME EXERCISE PROGRAM:  Access Code: [email or web address] URL: [email or web address] Date: 08/01/2022 Prepared by: Zebedee IbaAlan Zhou   ASSESSMENT:   CLINICAL IMPRESSION:  Pt reported slight increase in Rt knee pain during session.  Pt given cues during session to slow the speed or intensity of exercise.  Overall session was tolerated well.  She has exercised in water in past and plans to utilize her community outdoor pool when it opens up in May.  Will plan to create HEP that we'll modify as needed in upcoming aquatic sessions.   Pt would benefit from continued skilled therapy in order to reach goals and maximize functional L LE strength and ROM for prevention of further functional decline and surgical intervention.     Pt's upcoming bladder implant surgery and surgical healing  likely to impact POC and scheduling of visits.  OBJECTIVE IMPAIRMENTS decreased mobility, difficulty walking, decreased ROM, decreased strength, increased muscle spasms, improper body mechanics, postural dysfunction, and pain.    ACTIVITY LIMITATIONS lifting, sitting, standing, squatting, stairs, transfers, locomotion level   PARTICIPATION LIMITATIONS: cleaning, laundry, driving, shopping, community activity, occupation, yard work, and exercise   PERSONAL FACTORS Age, Fitness, Time since onset of injury/illness/exacerbation, and 3+ comorbidity:    are also affecting patient's functional outcome.    REHAB POTENTIAL: good   CLINICAL DECISION MAKING: stable/uncomplicated   EVALUATION COMPLEXITY: low     GOALS:     SHORT TERM GOALS: Target date: 09/12/2022      Pt will become independent with HEP in order to demonstrate synthesis of PT education..   Goal status: INITIAL   2.  STG FOTO to be set when able.      Goal status: INITIAL   3.  Pt will be able to demonstrate/report ability to sit/stand/sleep for extended periods of time without pain in order to demonstrate functional improvement and tolerance to static positioning.    Goal status: INITIAL     LONG TERM GOALS: Target date: 10/24/2022      Pt  will  become independent with final HEP in order to demonstrate synthesis of PT education.   Goal status: INITIAL   2.  FOTO LTG to be set when able    Goal status: INITIAL   3.  Pt will be able to demonstrate squat to at least parallel in order to demonstrate functional improvement in LE function for self-care and house hold duties.    Goal status: INITIAL   4.  Pt will be able to demonstrate reciprocal stair management without pain in order to demonstrate functional improvement in LE function for self-care and house hold duties. .    Goal status: INITIAL       PLAN: PT FREQUENCY: 1x/week (likely every other)   PT DURATION: 12 weeks (likely DC in 8-10wks)    PLANNED INTERVENTIONS: Therapeutic exercises, Therapeutic activity, Neuromuscular re-education, Balance training, Gait training, Patient/Family education, Self Care, and Joint mobilization   PLAN FOR NEXT SESSION: continue with R quad and hip strength, aquatics for R knee strength and stability; pt is very high functioning. 3x pool visit to establish HEP and then 3x land visits for gym  Mayer Camel, PTA 08/25/22 5:13 PM [facility] [address] [place], [place], [address] Phone: [phone]   Fax:  [phone]

## 2022-08-28 ENCOUNTER — Ambulatory Visit: Payer: Medicare Other | Admitting: Internal Medicine

## 2022-08-29 HISTORY — PX: OTHER SURGICAL HISTORY: SHX169

## 2022-08-31 ENCOUNTER — Encounter: Payer: Self-pay | Admitting: Bariatrics

## 2022-08-31 ENCOUNTER — Ambulatory Visit (INDEPENDENT_AMBULATORY_CARE_PROVIDER_SITE_OTHER): Payer: Medicare Other | Admitting: Bariatrics

## 2022-08-31 VITALS — BP 125/79 | HR 68 | Temp 97.7°F | Ht 66.0 in | Wt 253.0 lb

## 2022-08-31 DIAGNOSIS — Z6841 Body Mass Index (BMI) 40.0 and over, adult: Secondary | ICD-10-CM | POA: Diagnosis not present

## 2022-08-31 DIAGNOSIS — R7303 Prediabetes: Secondary | ICD-10-CM

## 2022-08-31 MED ORDER — SEMAGLUTIDE (2 MG/DOSE) 8 MG/3ML ~~LOC~~ SOPN
2.0000 mg | PEN_INJECTOR | SUBCUTANEOUS | 0 refills | Status: DC
Start: 2022-08-31 — End: 2022-10-03

## 2022-09-03 ENCOUNTER — Telehealth: Payer: Self-pay | Admitting: Family

## 2022-09-03 ENCOUNTER — Other Ambulatory Visit: Payer: Self-pay | Admitting: Family

## 2022-09-03 ENCOUNTER — Other Ambulatory Visit: Payer: Self-pay | Admitting: Internal Medicine

## 2022-09-03 DIAGNOSIS — Z5181 Encounter for therapeutic drug level monitoring: Secondary | ICD-10-CM

## 2022-09-03 NOTE — Telephone Encounter (Signed)
See mychart.  

## 2022-09-04 ENCOUNTER — Encounter: Payer: Self-pay | Admitting: *Deleted

## 2022-09-05 NOTE — Progress Notes (Signed)
Chief Complaint:   OBESITY Michelle Kennedy is here to discuss her progress with her obesity treatment plan along with follow-up of her obesity related diagnoses. Michelle Kennedy is on keeping a food journal and adhering to recommended goals of 1600 calories and 90-100 grams of protein and states she is following her eating plan approximately 70% of the time. Duana states she is doing aerobics for 45 minutes 1-2 times per week.  Today's visit was #: 16 Starting weight: 275 lbs Starting date: 07/28/2021 Today's weight: 253 lbs Today's date: 08/31/2022 Total lbs lost to date: 22 Total lbs lost since last in-office visit: 0  Interim History: Michelle Kennedy is up 2 lbs since her last visit.   Subjective:   1. Prediabetes Michelle Kennedy is taking Ozempic 1 mg and metformin.   Assessment/Plan:   1. Prediabetes Michelle Kennedy is to eliminate added sugar and starches. She will plan to go up to 2 mg of Ozempic in the future.   - Semaglutide, 2 MG/DOSE, 8 MG/3ML SOPN; Inject 2 mg as directed once a week.  Dispense: 3 mL; Refill: 0  2. BMI 40.0-44.9, adult  3. Morbid obesity Michelle Kennedy is currently in the action stage of change. As such, her goal is to continue with weight loss efforts. She has agreed to keeping a food journal and adhering to recommended goals of 1600 calories and 90-100 grams of protein.   She will adhere more closely to the plan. She will journal. We will recheck fasting IC at her next visit.   Exercise goals: Physical therapy and doing more exercise at home.   Behavioral modification strategies: increasing lean protein intake, decreasing simple carbohydrates, increasing vegetables, increasing water intake, decreasing eating out, no skipping meals, meal planning and cooking strategies, and keeping healthy foods in the home.  Michelle Kennedy has agreed to follow-up with our clinic in 4 weeks. She was informed of the importance of frequent follow-up visits to maximize her success with intensive lifestyle  modifications for her multiple health conditions.   Objective:   Blood pressure 125/79, pulse 68, temperature 97.7 F (36.5 C), height  (1.676 m), weight 253 lb (114.8 kg), SpO2 97 %. Body mass index is 40.84 kg/m.  General: Cooperative, alert, well developed, in no acute distress. HEENT: Conjunctivae and lids unremarkable. Cardiovascular: Regular rhythm.  Lungs: Normal work of breathing. Neurologic: No focal deficits.   Lab Results  Component Value Date   CREATININE 0.95 07/06/2022   BUN 17 07/06/2022   NA 141 07/06/2022   K 4.7 07/06/2022   CL 103 07/06/2022   CO2 22 07/06/2022   Lab Results  Component Value Date   ALT 19 07/06/2022   AST 15 07/06/2022   ALKPHOS 71 07/06/2022   BILITOT 0.4 07/06/2022   Lab Results  Component Value Date   HGBA1C 5.7 (H) 07/06/2022   HGBA1C 5.7 (A) 02/02/2022   HGBA1C 5.6 10/28/2021   HGBA1C 5.8 (H) 07/28/2021   HGBA1C 6.0 (A) 04/22/2021   Lab Results  Component Value Date   INSULIN 18.0 07/06/2022   INSULIN 25.6 (H) 07/28/2021   INSULIN 21.8 09/20/2016   Lab Results  Component Value Date   TSH 0.05 (L) 02/02/2022   Lab Results  Component Value Date   CHOL 170 07/06/2022   HDL 73 07/06/2022   LDLCALC 81 07/06/2022   LDLDIRECT 117.0 02/26/2019   TRIG 85 07/06/2022   CHOLHDL 3 06/28/2020   Lab Results  Component Value Date   VD25OH 40.4 07/06/2022   VD25OH 53.4  07/28/2021   VD25OH 41.4 09/20/2016   Lab Results  Component Value Date   WBC 5.1 07/28/2021   HGB 14.3 07/28/2021   HCT 45.0 07/28/2021   MCV 76 (L) 07/28/2021   PLT 255 07/28/2021   Lab Results  Component Value Date   IRON 71 06/23/2020   TIBC 294 06/23/2020   FERRITIN 94.6 06/23/2020   Attestation Statements:   Reviewed by clinician on day of visit: allergies, medications, problem list, medical history, surgical history, family history, social history, and previous encounter notes.   Trude Mcburney, am acting as Energy manager for  [employer], DO.  I have reviewed the above documentation for accuracy and completeness, and I agree with the above. Corinna Capra, DO

## 2022-09-07 ENCOUNTER — Ambulatory Visit (HOSPITAL_BASED_OUTPATIENT_CLINIC_OR_DEPARTMENT_OTHER): Payer: Medicare Other | Admitting: Physical Therapy

## 2022-09-11 ENCOUNTER — Ambulatory Visit (INDEPENDENT_AMBULATORY_CARE_PROVIDER_SITE_OTHER): Payer: Medicare Other | Admitting: Pulmonary Disease

## 2022-09-11 ENCOUNTER — Encounter: Payer: Self-pay | Admitting: Pulmonary Disease

## 2022-09-11 VITALS — BP 130/72 | HR 66 | Ht 67.0 in | Wt 257.4 lb

## 2022-09-11 DIAGNOSIS — J4540 Moderate persistent asthma, uncomplicated: Secondary | ICD-10-CM | POA: Diagnosis not present

## 2022-09-11 DIAGNOSIS — G4733 Obstructive sleep apnea (adult) (pediatric): Secondary | ICD-10-CM | POA: Diagnosis not present

## 2022-09-11 NOTE — Patient Instructions (Signed)
I will see you back in about 3 months  We will schedule you for home sleep study to ascertain whether you still have significant sleep apnea  Continue your weight loss efforts  Call us with significant concerns  For now I will say continue using the CPAP

## 2022-09-11 NOTE — Progress Notes (Signed)
Michelle Kennedy    [id number]    05-24-1955  Primary Care Physician:O'Sullivan, Efraim Kaufmann, NP  Referring Physician: Sandford Craze, NP [address] [place],  [place] [address]  Chief complaint:   Patient being seen for obstructive sleep apnea  HPI:  Diagnosed with obstructive sleep apnea about 2019  Repeat sleep study in 2022 showed mild obstructive sleep apnea with hypoventilation She continues to lose weight  Overall feeling much better  Denies any pain or discomfort  Overall, much healthier, functioning well  She does sleep well at night Feels well during the day Usually goes to bed about 930, sleep onset about 30 minutes They wake up once Final wake up time about 7:30 AM  Usually does not feel sleepy in the mornings Will start feeling sleepy about 3 to 4:00 in the afternoon  She is compliant with CPAP use   Outpatient Encounter Medications as of 09/11/2022  Medication Sig   albuterol (PROVENTIL) (5 MG/ML) 0.5% nebulizer solution Take 0.5 mLs (2.5 mg total) by nebulization every 6 (six) hours as needed for wheezing or shortness of breath.   albuterol (VENTOLIN HFA) 108 (90 Base) MCG/ACT inhaler USE 2 INHALATIONS ORALLY   EVERY 6 HOURS AS NEEDED FORWHEEZING OR SHORTNESS OF   BREATH.   amLODipine (NORVASC) 5 MG tablet Take 1 tablet (5 mg total) by mouth daily.   anastrozole (ARIMIDEX) 1 MG tablet TAKE 1 TABLET (1 MG TOTAL) BY MOUTH DAILY.   betamethasone valerate ointment (VALISONE) 0.1 % Apply 1 Application topically 2 (two) times daily.   CALCIUM PO Take 1 tablet by mouth every evening.   cholecalciferol (VITAMIN D3) 25 MCG (1000 UNIT) tablet Take 1,000 Units by mouth every evening.    clopidogrel (PLAVIX) 75 MG tablet TAKE 1 TABLET EVERY DAY   EPINEPHrine 0.3 mg/0.3 mL IJ SOAJ injection Inject 0.3 mg into the muscle as needed for anaphylaxis.   fluticasone-salmeterol (WIXELA INHUB) 250-50 MCG/ACT AEPB Inhale 1 puff into the lungs in  the morning and at bedtime.   furosemide (LASIX) 20 MG tablet TAKE 1 TABLET(20 MG) BY MOUTH DAILY AS NEEDED FOR SWELLING   gabapentin (NEURONTIN) 100 MG capsule TAKE 2 CAPSULES THREE TIMES DAILY   GEMTESA 75 MG TABS Take 1 tablet by mouth daily.   latanoprost (XALATAN) 0.005 % ophthalmic solution 1 drop at bedtime.   levothyroxine (SYNTHROID) 150 MCG tablet TAKE 1 TABLET EVERY DAY   lisinopril (ZESTRIL) 20 MG tablet TAKE 2 TABLETS EVERY DAY   meclizine (ANTIVERT) 25 MG tablet Take 1 tablet (25 mg total) by mouth 3 (three) times daily as needed for dizziness.   metFORMIN (GLUCOPHAGE) 500 MG tablet Take 1 tablet (500 mg total) by mouth daily with breakfast.   methocarbamol (ROBAXIN) 500 MG tablet Take 1 tablet (500 mg total) by mouth every 8 (eight) hours as needed for muscle spasms.   Multiple Vitamin (MULTIVITAMIN ADULT) TABS Take 1 tablet by mouth daily.   Multiple Vitamins-Minerals (OCUVITE PO) Take 1 tablet by mouth daily.   nystatin cream (MYCOSTATIN) Apply 1 Application topically 2 (two) times daily.   Omega-3 Fatty Acids (FISH OIL) 1000 MG CAPS Take 1 capsule by mouth every evening.    ondansetron (ZOFRAN) 4 MG tablet Take 1 tablet (4 mg total) by mouth every 8 (eight) hours as needed for nausea or vomiting.   pantoprazole (PROTONIX) 40 MG tablet TAKE 1 TABLET EVERY DAY   rosuvastatin (CRESTOR) 5 MG tablet  Take 1 tablet (5 mg total) by mouth daily.   Semaglutide, 2 MG/DOSE, 8 MG/3ML SOPN Inject 2 mg as directed once a week.   terbinafine (LAMISIL) 250 MG tablet Take 1 tablet (250 mg total) by mouth daily.   traMADol (ULTRAM) 50 MG tablet Take 1 tablet (50 mg total) by mouth every 6 (six) hours as needed for moderate pain or severe pain.   No facility-administered encounter medications on file as of 09/11/2022.    Allergies as of 09/11/2022 - Review Complete 08/31/2022  Allergen Reaction Noted   Bee venom Anaphylaxis 02/17/2014   Betadine [povidone iodine] Anaphylaxis 12/03/2017    Codeine Shortness Of Breath and Rash 12/01/2009   Contrast media [iodinated contrast media] Shortness Of Breath 12/31/2013   Gadolinium derivatives Hives, Rash, Shortness Of Breath, and Swelling 03/28/2014   Iodine Other (See Comments) 12/01/2009   Latex Anaphylaxis and Rash 12/20/2009   Lidocaine Shortness Of Breath and Swelling 12/20/2009   Penicillins Shortness Of Breath, Rash, and Other (See Comments) 12/01/2009   Pentazocine lactate Shortness Of Breath and Rash 12/20/2009   Shellfish allergy Shortness Of Breath and Rash 02/17/2014   Aspirin Rash and Other (See Comments) 12/20/2009   Erythromycin Swelling and Rash 12/01/2009   Symbicort [budesonide-formoterol fumarate] Other (See Comments) 08/29/2013   Compazine [prochlorperazine maleate] Rash 04/02/2014   Sulfonamide derivatives Rash 12/20/2009    Past Medical History:  Diagnosis Date   Abnormally small mouth    Achilles tendon disorder, right    Allergy    Anemia    Anxiety    Arthritis    hips and knees   Asthma    daily inhaler, prn inhaler and neb.   Asthma due to environmental allergies    Basal cell carcinoma    nose, s/p mohs.   Breast cancer 01/2014   left   Cataract    Chest pain    CHF (congestive heart failure)    COPD (chronic obstructive pulmonary disease)    Dental bridge present    upper front and lower right   Dental crowns present    x 3   Depression    Esophageal spasm    reports since her chemo and breast surgery  she developed esophageal spasms and reports this is in the past has casue her 02 to desat in the  70s , denies sycnope in relation to this , does report hx of vertigo as well    Family history of anesthesia complication    twin brother aspirated and died on OR table, per pt.   Fibromyalgia    Gallbladder disease    Gallstones    GERD (gastroesophageal reflux disease)    Glaucoma    H/O blood clots    had blood to  PICC line    History of gastric ulcer    as a teenager   History  of seizure age 69   as a reaction to Penicillin - no seizures since   History of stomach ulcers    History of thyroid cancer    s/p thyroidectomy   HTN (hypertension)    Hyperlipidemia    Hypothyroidism    Joint pain    Left knee injury    Liver disorder    seen when she had her hysterectomy , reports she was told it was  " small lesion" ; but asymptomatic    Lymphedema    left arm   Migraines    Multiple food allergies    Obesity  OSA (obstructive sleep apnea) 02/16/2016   CPAP   Palpitations    reports no longer experiences   Personal history of chemotherapy 2015   Personal history of radiation therapy 2015   Left   Prediabetes    Rheumatoid arthritis    Sleep apnea    SOB (shortness of breath)    Swelling of both ankles    Tinnitus    UTI (lower urinary tract infection) 02/17/2014   Vertigo    last episode  was 2 weeks ago    Vitamin D deficiency    Wears contact lenses    left eye only    Wears hearing aid in both ears     Past Surgical History:  Procedure Laterality Date   ABDOMINAL HYSTERECTOMY  2004   complete   ACHILLES TENDON REPAIR Right    APPENDECTOMY  2004   BREAST EXCISIONAL BIOPSY     BREAST LUMPECTOMY Left    2015   BREAST LUMPECTOMY WITH NEEDLE LOCALIZATION AND AXILLARY SENTINEL LYMPH NODE BX Left 02/23/2014   Procedure: BREAST LUMPECTOMY WITH NEEDLE LOCALIZATION AND AXILLARY SENTINEL LYMPH NODE BIOPSY;  Surgeon: Glenna Fellows, MD;  Location: [facility];  Service: General;  Laterality: Left;   BREAST SURGERY  2011   left breast biposy   CHOLECYSTECTOMY  1990   COLONOSCOPY W/ POLYPECTOMY  06/2009   EYE SURGERY Right 2013   exc. warts from underneath eyelid   INCONTINENCE SURGERY  2004   KNEE ARTHROSCOPY Bilateral    x 6 each knee   LIGAMENT REPAIR Right    thumb/wrist   ORIF TOE FRACTURE Right    great toe   PORTACATH PLACEMENT N/A 03/24/2014   Procedure: INSERTION PORT-A-CATH;  Surgeon: Glenna Fellows, MD;   Location: [facility];  Service: General;  Laterality: N/A;; removed    THYROIDECTOMY  2000   TONSILLECTOMY AND ADENOIDECTOMY  2000   TOTAL KNEE ARTHROPLASTY Left 12/03/2017   Procedure: LEFT TOTAL KNEE ARTHROPLASTY;  Surgeon: Ollen Gross, MD;  Location: [facility];  Service: Orthopedics;  Laterality: Left;   TUBAL LIGATION  1981   TUMOR EXCISION     from thoracic spine    Family History  Problem Relation Age of Onset   Alcohol abuse Mother    Arthritis Mother    Hypertension Mother    Bipolar disorder Mother    Breast cancer Mother 27       unconfirmed   Lung cancer Mother 68       smoker   Hyperlipidemia Mother    Stroke Mother    Cancer Mother    Depression Mother    Anxiety disorder Mother    Alcoholism Mother    Drug abuse Mother    Eating disorder Mother    Obesity Mother    Alcohol abuse Father    Hyperlipidemia Father    Kidney disease Father    Diabetes Father    Hypertension Father    Cystic kidney disease Father    Thyroid disease Father    Liver disease Father    Alcoholism Father    Lung cancer Father    Thyroid cancer Sister 38       type?; currently 44   Other Sister        ovarian tumor @ 71; TAH/BSO   Breast cancer Sister    Thyroid cancer Brother        dx 30s; currently 78   Breast cancer Maternal Aunt  dx 29s; deceased 76   Thyroid cancer Paternal Aunt        All 3 paternal aunts with thyroid ca in 30s/40s   Lung cancer Paternal Aunt        2 of 3 paternal aunts with lung cancer   Arthritis Maternal Grandmother    Diabetes Paternal Grandmother    Heart disease Other    COPD Other    Asthma Other     Social History   Socioeconomic History   Marital status: Married    Spouse name: Dayton Scrape   Number of children: 2   Years of education: Not on file   Highest education level: Not on file  Occupational History   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   Social Determinants of Health   Financial Resource Strain: Low Risk  (08/23/2022)   Overall Financial Resource Strain (CARDIA)    Difficulty of Paying Living Expenses: Not hard at all  Food Insecurity: No Food Insecurity (08/23/2022)   Hunger Vital Sign    Worried About Running Out of Food in the Last Year: Never true    Ran Out of Food in the Last Year: Never true  Transportation Needs: No Transportation Needs (08/23/2022)   PRAPARE - Administrator, Civil Service (Medical): No    Lack of Transportation (Non-Medical): No  Physical Activity: Sufficiently Active (08/23/2022)   Exercise Vital Sign    Days of Exercise per Week: 5 days    Minutes of Exercise per Session: 40 min  Stress: No Stress Concern Present (08/23/2022)   [employer] of Occupational Health - Occupational Stress Questionnaire    Feeling of Stress : Not at all  Social Connections: Unknown (08/23/2022)   Social Connection and Isolation Panel [NHANES]    Frequency of Communication with Friends and Family: Once a week    Frequency of Social Gatherings with Friends and Family: Never    Attends Religious Services: Not on Insurance claims handler of Clubs or Organizations: No    Attends Banker Meetings: Never    Marital Status: Married  Catering manager Violence: Not At Risk (08/24/2022)   Humiliation, Afraid, Rape, and Kick questionnaire    Fear of Current or Ex-Partner: No    Emotionally Abused: No    Physically Abused: No    Sexually Abused: No     Review of Systems  Constitutional:  Negative for fatigue.  Respiratory:  Positive for apnea.   Psychiatric/Behavioral:  Positive for sleep disturbance.     There were no vitals filed for this visit.    Physical Exam Constitutional:      Appearance: She is obese.  HENT:     Head: Normocephalic.     Nose: No congestion.     Mouth/Throat:     Mouth: Mucous membranes are moist.  Comments: Mallampati 4, crowded oropharynx, macroglossia Eyes:     General:        Right eye: No discharge.        Left eye: No discharge.     Pupils: Pupils are equal, round, and reactive to light.  Cardiovascular:     Rate and Rhythm: Normal rate and regular rhythm.     Heart sounds: No murmur heard.    No friction rub.  Pulmonary:     Effort: No respiratory distress.     Breath sounds: No stridor. No wheezing or rhonchi.  Musculoskeletal:     Cervical back: No rigidity or tenderness.  Neurological:     Mental Status: She is alert.  Psychiatric:        Mood and Affect: Mood normal.    Data Reviewed: Recent sleep study reviewed showing mild obstructive sleep apnea  Compliance data reviewed showing excellent compliance with CPAP Average use of 8 hours 46 minutes AutoSet 5-20 Residual AHI 1.3  Echocardiogram 08/23/2019 shows normal ejection fraction diastolic dysfunction  Assessment:  Mild obstructive sleep apnea -Adequately controlled with CPAP therapy -Feeling much better  Class III obesity -Continues to lose weight -Exercising regularly  Asthma -Stable symptoms -Well-controlled on Wixela 250   Plan/Recommendations: Continue auto CPAP 5-20  Patient wonders whether she still has significant obstructive sleep apnea -Will schedule patient for a home sleep study to ascertain presence of obstructive sleep apnea as she has managed to lose a good amount of weight  Continue Wixela  I will see her in about 3 months  Encouraged to call with significant concerns   Virl Diamond MD [facility] 09/11/2022, 9:55 AM  CC: Sandford Craze, NP

## 2022-09-11 NOTE — Addendum Note (Signed)
Addended by: Lanna Poche on: 09/11/2022 10:50 AM   Modules accepted: Orders

## 2022-09-13 ENCOUNTER — Other Ambulatory Visit (INDEPENDENT_AMBULATORY_CARE_PROVIDER_SITE_OTHER): Payer: Medicare Other

## 2022-09-13 DIAGNOSIS — Z5181 Encounter for therapeutic drug level monitoring: Secondary | ICD-10-CM

## 2022-09-13 LAB — HEPATIC FUNCTION PANEL
ALT: 19 U/L (ref 0–35)
AST: 16 U/L (ref 0–37)
Albumin: 4.2 g/dL (ref 3.5–5.2)
Alkaline Phosphatase: 58 U/L (ref 39–117)
Bilirubin, Direct: 0.1 mg/dL (ref 0.0–0.3)
Total Bilirubin: 0.4 mg/dL (ref 0.2–1.2)
Total Protein: 6.5 g/dL (ref 6.0–8.3)

## 2022-09-14 ENCOUNTER — Ambulatory Visit: Payer: Medicare Other

## 2022-09-14 ENCOUNTER — Other Ambulatory Visit: Payer: Self-pay | Admitting: Family

## 2022-09-14 DIAGNOSIS — J4540 Moderate persistent asthma, uncomplicated: Secondary | ICD-10-CM

## 2022-09-14 DIAGNOSIS — G4733 Obstructive sleep apnea (adult) (pediatric): Secondary | ICD-10-CM

## 2022-09-14 MED ORDER — TERBINAFINE HCL 250 MG PO TABS: 250.0000 mg | ORAL_TABLET | Freq: Every day | ORAL | 0 refills | Status: DC

## 2022-09-18 ENCOUNTER — Inpatient Hospital Stay: Payer: Medicare Other | Attending: Hematology and Oncology | Admitting: Hematology and Oncology

## 2022-09-18 VITALS — BP 144/72 | HR 72 | Temp 97.3°F | Resp 18 | Ht 67.0 in | Wt 255.1 lb

## 2022-09-18 DIAGNOSIS — C50412 Malignant neoplasm of upper-outer quadrant of left female breast: Secondary | ICD-10-CM | POA: Insufficient documentation

## 2022-09-18 DIAGNOSIS — Z170 Estrogen receptor positive status [ER+]: Secondary | ICD-10-CM | POA: Diagnosis not present

## 2022-09-18 DIAGNOSIS — G620 Drug-induced polyneuropathy: Secondary | ICD-10-CM | POA: Insufficient documentation

## 2022-09-18 DIAGNOSIS — Z79811 Long term (current) use of aromatase inhibitors: Secondary | ICD-10-CM | POA: Insufficient documentation

## 2022-09-18 NOTE — Assessment & Plan Note (Signed)
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 M0 stage IIB Adjuvant chemotherapy Completed 4 cycles of dose dense Adriamycin and Cytoxan. Followed by Abraxane X 12 completed 08/18/14, status post radiation completed June 2016, started anastrozole 1 mg daily 11/20/2014 (also enrolled on [name] clinical trial randomized to [facility]) completed 2 years 01/23/2017   Breast Cancer Surveillance: 1. Breast exam: 09/18/2022: Benign 2. Mammogram 12/22/2021: Benign breast density category B   Weight gain: She tells me that her weight has been fluctuating and she had lost 10 pounds recently by exercising regularly.   Chemo induced peripheral neuropathy Hospitalization: 08/22/2019-08/23/2019 severe headache with right-sided visual disturbance: Stroke work-up negative, started on Plavix 75 mg daily   She has a 20 acres in [place] and is hoping to build a house and retire there at some point in the future.

## 2022-09-18 NOTE — Progress Notes (Signed)
Patient Care Team: Sandford Craze, NP as PCP - General (Internal Medicine) Glenna Fellows, MD (Inactive) as Consulting Physician (General Surgery) Serena Croissant, MD as Consulting Physician (Hematology and Oncology) Lonie Peak, MD as Attending Physician (Radiation Oncology) Doristine Bosworth., MD (Endocrinology) Rexene Edison, RN as Registered Nurse (Medical Oncology) Tanya Nones, OD as Consulting Physician (Optometry)  DIAGNOSIS:  Encounter Diagnosis  Name Primary?   Malignant neoplasm of upper-outer quadrant of left breast in female, estrogen receptor positive (HCC) Yes    SUMMARY OF ONCOLOGIC HISTORY: Oncology History  Breast cancer of upper-outer quadrant of left female breast (HCC)  12/22/2013 Mammogram   Left breast upper-outer quadrant 1.8 x 1.4 x 1.6 cm mass with left axillary lymph node measuring 3.6 mm   12/30/2013 Breast MRI   Dominant enhancing left upper outer quadrant irregular mass encompassing a confluent area of abnormal clumped nodular enhancement, 5.5 cm overall, 6 mm masslike enhancement central left breast     02/23/2014 Surgery   Left lumpectomy: Invasive ductal carcinoma grade 2 spending 3.8 cm intermediate grade DCIS with lymphovascular invasion margins negative one out of 4 SLN positive, ER 99%, PR 100%, HER-2 negative ratio 0.74, T2, N1, M0 stage IIB   03/19/2014 PET scan   No evidence of distant metastatic disease   03/31/2014 - 08/18/2014 Chemotherapy   Adjuvant chemotherapy with dose dense Adriamycin Cytoxan x4 followed by Abraxane weekly x12   08/28/2014 Imaging   Left brachial vein thrombus   09/11/2014 -  Radiation Therapy   Adjuvant radiation therapy   11/20/2014 -  Anti-estrogen oral therapy   Anastrozole 1 mg daily + Ibrance ([name] clinical trial)     CHIEF COMPLIANT: Follow-up of left breast cancer    INTERVAL HISTORY: Michelle Kennedy is a 67 y.o. with above-mentioned history of left breast cancer who underwent a  lumpectomy, adjuvant chemotherapy, radiation, and is currently on anastrozole. She presents to the clinic for a follow-up. She reports that she has been doing well. She does have some neuropathy in feet and some in finger tips.   ALLERGIES:  is allergic to bee venom, betadine [povidone iodine], codeine, contrast media [iodinated contrast media], gadolinium derivatives, iodine, latex, lidocaine, penicillins, pentazocine lactate, shellfish allergy, aspirin, erythromycin, symbicort [budesonide-formoterol fumarate], compazine [prochlorperazine maleate], and sulfonamide derivatives.  MEDICATIONS:  Current Outpatient Medications  Medication Sig Dispense Refill   albuterol (PROVENTIL) (5 MG/ML) 0.5% nebulizer solution Take 0.5 mLs (2.5 mg total) by nebulization every 6 (six) hours as needed for wheezing or shortness of breath. 40 mL 3   albuterol (VENTOLIN HFA) 108 (90 Base) MCG/ACT inhaler USE 2 INHALATIONS ORALLY   EVERY 6 HOURS AS NEEDED FORWHEEZING OR SHORTNESS OF   BREATH. 54 g 1   amLODipine (NORVASC) 5 MG tablet Take 1 tablet (5 mg total) by mouth daily. 90 tablet 1   betamethasone valerate ointment (VALISONE) 0.1 % Apply 1 Application topically 2 (two) times daily. 30 g 2   CALCIUM PO Take 1 tablet by mouth every evening.     cholecalciferol (VITAMIN D3) 25 MCG (1000 UNIT) tablet Take 1,000 Units by mouth every evening.      clopidogrel (PLAVIX) 75 MG tablet TAKE 1 TABLET EVERY DAY 90 tablet 1   EPINEPHrine 0.3 mg/0.3 mL IJ SOAJ injection Inject 0.3 mg into the muscle as needed for anaphylaxis. 2 each 1   fluticasone-salmeterol (WIXELA INHUB) 250-50 MCG/ACT AEPB Inhale 1 puff into the lungs in the morning and at bedtime. 180 each 6  furosemide (LASIX) 20 MG tablet TAKE 1 TABLET(20 MG) BY MOUTH DAILY AS NEEDED FOR SWELLING 30 tablet 3   gabapentin (NEURONTIN) 100 MG capsule TAKE 2 CAPSULES THREE TIMES DAILY 540 capsule 3   GEMTESA 75 MG TABS Take 1 tablet by mouth daily.     latanoprost  (XALATAN) 0.005 % ophthalmic solution 1 drop at bedtime.     levothyroxine (SYNTHROID) 150 MCG tablet TAKE 1 TABLET EVERY DAY 30 tablet 0   lisinopril (ZESTRIL) 20 MG tablet TAKE 2 TABLETS EVERY DAY 180 tablet 1   meclizine (ANTIVERT) 25 MG tablet Take 1 tablet (25 mg total) by mouth 3 (three) times daily as needed for dizziness. 30 tablet 0   metFORMIN (GLUCOPHAGE) 500 MG tablet Take 1 tablet (500 mg total) by mouth daily with breakfast. 90 tablet 0   methocarbamol (ROBAXIN) 500 MG tablet Take 1 tablet (500 mg total) by mouth every 8 (eight) hours as needed for muscle spasms. 20 tablet 0   Multiple Vitamin (MULTIVITAMIN ADULT) TABS Take 1 tablet by mouth daily.     Multiple Vitamins-Minerals (OCUVITE PO) Take 1 tablet by mouth daily.     nystatin cream (MYCOSTATIN) Apply 1 Application topically 2 (two) times daily. 30 g 0   Omega-3 Fatty Acids (FISH OIL) 1000 MG CAPS Take 1 capsule by mouth every evening.      ondansetron (ZOFRAN) 4 MG tablet Take 1 tablet (4 mg total) by mouth every 8 (eight) hours as needed for nausea or vomiting. 20 tablet 0   pantoprazole (PROTONIX) 40 MG tablet TAKE 1 TABLET EVERY DAY 90 tablet 1   rosuvastatin (CRESTOR) 5 MG tablet Take 1 tablet (5 mg total) by mouth daily. 90 tablet 3   Semaglutide, 2 MG/DOSE, 8 MG/3ML SOPN Inject 2 mg as directed once a week. 3 mL 0   terbinafine (LAMISIL) 250 MG tablet Take 1 tablet (250 mg total) by mouth daily. 60 tablet 0   traMADol (ULTRAM) 50 MG tablet Take 1 tablet (50 mg total) by mouth every 6 (six) hours as needed for moderate pain or severe pain. 20 tablet 0   No current facility-administered medications for this visit.    PHYSICAL EXAMINATION: ECOG PERFORMANCE STATUS: 1 - Symptomatic but completely ambulatory  Vitals:   09/18/22 1507  BP: (!) 144/72  Pulse: 72  Resp: 18  Temp: (!) 97.3 F (36.3 C)  SpO2: 99%   Filed Weights   09/18/22 1507  Weight: 255 lb 1.6 oz (115.7 kg)    BREAST: No palpable masses or  nodules in either right or left breasts. No palpable axillary supraclavicular or infraclavicular adenopathy no breast tenderness or nipple discharge. (exam performed in the presence of a chaperone)  LABORATORY DATA:  I have reviewed the data as listed    Latest Ref Rng & Units 09/13/2022    9:59 AM 07/06/2022   10:34 AM 10/28/2021    3:31 PM  CMP  Glucose 70 - 99 mg/dL  90  161   BUN 8 - 27 mg/dL  17  13   Creatinine 0.96 - 1.00 mg/dL  0.45  4.09   Sodium 811 - 144 mmol/L  141  141   Potassium 3.5 - 5.2 mmol/L  4.7  4.2   Chloride 96 - 106 mmol/L  103  108   CO2 20 - 29 mmol/L  22  27   Calcium 8.7 - 10.3 mg/dL  9.7  9.1   Total Protein 6.0 - 8.3 g/dL 6.5  7.0    Total Bilirubin 0.2 - 1.2 mg/dL 0.4  0.4    Alkaline Phos 39 - 117 U/L 58  71    AST 0 - 37 U/L 16  15    ALT 0 - 35 U/L 19  19      Lab Results  Component Value Date   WBC 5.1 07/28/2021   HGB 14.3 07/28/2021   HCT 45.0 07/28/2021   MCV 76 (L) 07/28/2021   PLT 255 07/28/2021   NEUTROABS 3.2 07/28/2021    ASSESSMENT & PLAN:  Breast cancer of upper-outer quadrant of left female breast (HCC) 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 M0 stage IIB Adjuvant chemotherapy Completed 4 cycles of dose dense Adriamycin and Cytoxan. Followed by Abraxane X 12 completed 08/18/14, status post radiation completed June 2016, started anastrozole 1 mg daily 11/20/2014 (also enrolled on [name] clinical trial randomized to [facility]) completed 2 years 01/23/2017   Breast Cancer Surveillance: 1. Breast exam: 09/18/2022: Benign 2. Mammogram 12/22/2021: Benign breast density category B   Weight gain: She tells me that her weight has been fluctuating and she had lost 10 pounds recently by exercising regularly.   Chemo induced peripheral neuropathy Hospitalization: 08/22/2019-08/23/2019 severe headache with right-sided visual disturbance: Stroke  work-up negative, started on Plavix 75 mg daily   She is still considers going back to the [place] to be closer to her family.  She has been undecided about [place] versus [place]. She lost about 60 pounds with Ozempic.    No orders of the defined types were placed in this encounter.  The patient has a good understanding of the overall plan. she agrees with it. she will call with any problems that may develop before the next visit here. Total time spent: 30 mins including face to face time and time spent for planning, charting and co-ordination of care   Tamsen Meek, MD 09/18/22    I Janan Ridge am acting as a Neurosurgeon for [employer]  I have reviewed the above documentation for accuracy and completeness, and I agree with the above.

## 2022-09-19 ENCOUNTER — Telehealth: Payer: Self-pay | Admitting: Pulmonary Disease

## 2022-09-19 DIAGNOSIS — G4733 Obstructive sleep apnea (adult) (pediatric): Secondary | ICD-10-CM | POA: Diagnosis not present

## 2022-09-19 NOTE — Telephone Encounter (Signed)
Call patient  Sleep study result  Date of study: 09/14/2022  Impression:  Negative study for significant sleep apnea Mild oxygen desaturations  Recommendation:  With significant weight loss, may not need to continue using CPAP therapy if there is no ongoing symptoms of daytime sleepiness and fatigue  Continue weight loss efforts  Call with significant concerns

## 2022-09-21 ENCOUNTER — Ambulatory Visit (HOSPITAL_BASED_OUTPATIENT_CLINIC_OR_DEPARTMENT_OTHER): Payer: Medicare Other | Admitting: Physical Therapy

## 2022-09-21 ENCOUNTER — Ambulatory Visit
Admission: EM | Admit: 2022-09-21 | Discharge: 2022-09-21 | Disposition: A | Discharge status: Home / Self Care | Payer: Medicare Other

## 2022-09-21 DIAGNOSIS — M79644 Pain in right finger(s): Secondary | ICD-10-CM | POA: Diagnosis not present

## 2022-09-21 DIAGNOSIS — S61011A Laceration without foreign body of right thumb without damage to nail, initial encounter: Secondary | ICD-10-CM

## 2022-09-21 NOTE — ED Provider Notes (Signed)
[facility]  Note:  This document was prepared using Conservation officer, historic buildings and may include unintentional dictation errors.  MRN: [id number] DOB: [birth year]  Subjective:   Michelle Kennedy is a 67 y.o. female presenting for a right thumb laceration from a box cutter. No loss of ROM, sensation. She is on Plavix. Patient's relative expresses significant concern about lidocaine use, allergic reactions. She has had laceration repair before with use of this medication. Patient's relative requested we administer a small amount and wait to see if there is a reaction. Ultimately, patient was agreeable to use the lidocaine for numbing. Tdap is up to date.   No current facility-administered medications for this encounter.  Current Outpatient Medications:    albuterol (PROVENTIL) (5 MG/ML) 0.5% nebulizer solution, Take 0.5 mLs (2.5 mg total) by nebulization every 6 (six) hours as needed for wheezing or shortness of breath., Disp: 40 mL, Rfl: 3   albuterol (VENTOLIN HFA) 108 (90 Base) MCG/ACT inhaler, USE 2 INHALATIONS ORALLY   EVERY 6 HOURS AS NEEDED FORWHEEZING OR SHORTNESS OF   BREATH., Disp: 54 g, Rfl: 1   amLODipine (NORVASC) 5 MG tablet, Take 1 tablet (5 mg total) by mouth daily., Disp: 90 tablet, Rfl: 1   betamethasone valerate ointment (VALISONE) 0.1 %, Apply 1 Application topically 2 (two) times daily., Disp: 30 g, Rfl: 2   CALCIUM PO, Take 1 tablet by mouth every evening., Disp: , Rfl:    cholecalciferol (VITAMIN D3) 25 MCG (1000 UNIT) tablet, Take 1,000 Units by mouth every evening. , Disp: , Rfl:    clopidogrel (PLAVIX) 75 MG tablet, TAKE 1 TABLET EVERY DAY, Disp: 90 tablet, Rfl: 1   EPINEPHrine 0.3 mg/0.3 mL IJ SOAJ injection, Inject 0.3 mg into the muscle as needed for anaphylaxis., Disp: 2 each, Rfl: 1   fluticasone-salmeterol (WIXELA INHUB) 250-50 MCG/ACT AEPB, Inhale 1 puff into the lungs in the morning and at bedtime., Disp: 180 each, Rfl: 6    furosemide (LASIX) 20 MG tablet, TAKE 1 TABLET(20 MG) BY MOUTH DAILY AS NEEDED FOR SWELLING, Disp: 30 tablet, Rfl: 3   gabapentin (NEURONTIN) 100 MG capsule, TAKE 2 CAPSULES THREE TIMES DAILY, Disp: 540 capsule, Rfl: 3   GEMTESA 75 MG TABS, Take 1 tablet by mouth daily., Disp: , Rfl:    latanoprost (XALATAN) 0.005 % ophthalmic solution, 1 drop at bedtime., Disp: , Rfl:    levothyroxine (SYNTHROID) 150 MCG tablet, TAKE 1 TABLET EVERY DAY, Disp: 30 tablet, Rfl: 0   lisinopril (ZESTRIL) 20 MG tablet, TAKE 2 TABLETS EVERY DAY, Disp: 180 tablet, Rfl: 1   meclizine (ANTIVERT) 25 MG tablet, Take 1 tablet (25 mg total) by mouth 3 (three) times daily as needed for dizziness., Disp: 30 tablet, Rfl: 0   metFORMIN (GLUCOPHAGE) 500 MG tablet, Take 1 tablet (500 mg total) by mouth daily with breakfast., Disp: 90 tablet, Rfl: 0   methocarbamol (ROBAXIN) 500 MG tablet, Take 1 tablet (500 mg total) by mouth every 8 (eight) hours as needed for muscle spasms., Disp: 20 tablet, Rfl: 0   Multiple Vitamin (MULTIVITAMIN ADULT) TABS, Take 1 tablet by mouth daily., Disp: , Rfl:    Multiple Vitamins-Minerals (OCUVITE PO), Take 1 tablet by mouth daily., Disp: , Rfl:    nystatin cream (MYCOSTATIN), Apply 1 Application topically 2 (two) times daily., Disp: 30 g, Rfl: 0   Omega-3 Fatty Acids (FISH OIL) 1000 MG CAPS, Take 1 capsule by mouth every evening. , Disp: , Rfl:  ondansetron (ZOFRAN) 4 MG tablet, Take 1 tablet (4 mg total) by mouth every 8 (eight) hours as needed for nausea or vomiting., Disp: 20 tablet, Rfl: 0   pantoprazole (PROTONIX) 40 MG tablet, TAKE 1 TABLET EVERY DAY, Disp: 90 tablet, Rfl: 1   rosuvastatin (CRESTOR) 5 MG tablet, Take 1 tablet (5 mg total) by mouth daily., Disp: 90 tablet, Rfl: 3   Semaglutide, 2 MG/DOSE, 8 MG/3ML SOPN, Inject 2 mg as directed once a week., Disp: 3 mL, Rfl: 0   terbinafine (LAMISIL) 250 MG tablet, Take 1 tablet (250 mg total) by mouth daily., Disp: 60 tablet, Rfl: 0   traMADol  (ULTRAM) 50 MG tablet, Take 1 tablet (50 mg total) by mouth every 6 (six) hours as needed for moderate pain or severe pain., Disp: 20 tablet, Rfl: 0   Allergies  Allergen Reactions   Bee Venom Anaphylaxis   Betadine [Povidone Iodine] Anaphylaxis    Rash. Breathing problems.    Codeine Shortness Of Breath and Rash   Contrast Media [Iodinated Contrast Media] Shortness Of Breath   Gadolinium Derivatives Hives, Rash, Shortness Of Breath and Swelling    "my heart stopped beating"    Iodine Other (See Comments)    CARDIAC ARREST   Latex Anaphylaxis and Rash   Lidocaine Shortness Of Breath and Swelling    SWELLING OF MOUTH AND THROAT, low BP   Penicillins Shortness Of Breath, Rash and Other (See Comments)    SEIZURE Did it involve swelling of the face/tongue/throat, SOB, or low BP? no Did it involve sudden or severe rash/hives, skin peeling, or any reaction on the inside of your mouth or nose? yes Did you need to seek medical attention at a hospital or doctor's office? yes When did it last happen?      2010 If all above answers are "NO", may proceed with cephalosporin use.    Pentazocine Lactate Shortness Of Breath and Rash   Shellfish Allergy Shortness Of Breath and Rash   Aspirin Rash and Other (See Comments)    GI UPSET   Erythromycin Swelling and Rash    SWELLING OF JOINTS   Symbicort [Budesonide-Formoterol Fumarate] Other (See Comments)    BURNING OF TONGUE AND LIPS   Compazine [Prochlorperazine Maleate] Rash    Rash on face,chest, arms, back   Sulfonamide Derivatives Rash    Past Medical History:  Diagnosis Date   Abnormally small mouth    Achilles tendon disorder, right    Allergy    Anemia    Anxiety    Arthritis    hips and knees   Asthma    daily inhaler, prn inhaler and neb.   Asthma due to environmental allergies    Basal cell carcinoma    nose, s/p mohs.   Breast cancer (HCC) 01/2014   left   Cataract    Chest pain    CHF (congestive heart failure) (HCC)     COPD (chronic obstructive pulmonary disease) (HCC)    Dental bridge present    upper front and lower right   Dental crowns present    x 3   Depression    Esophageal spasm    reports since her chemo and breast surgery  she developed esophageal spasms and reports this is in the past has casue her 02 to desat in the  70s , denies sycnope in relation to this , does report hx of vertigo as well    Family history of anesthesia complication    twin  brother aspirated and died on OR table, per pt.   Fibromyalgia    Gallbladder disease    Gallstones    GERD (gastroesophageal reflux disease)    Glaucoma    H/O blood clots    had blood to  PICC line    History of gastric ulcer    as a teenager   History of seizure age 89   as a reaction to Penicillin - no seizures since   History of stomach ulcers    History of thyroid cancer    s/p thyroidectomy   HTN (hypertension)    Hyperlipidemia    Hypothyroidism    Joint pain    Left knee injury    Liver disorder    seen when she had her hysterectomy , reports she was told it was  " small lesion" ; but asymptomatic    Lymphedema    left arm   Migraines    Multiple food allergies    Obesity    OSA (obstructive sleep apnea) 02/16/2016   CPAP   Palpitations    reports no longer experiences   Personal history of chemotherapy 2015   Personal history of radiation therapy 2015   Left   Prediabetes    Rheumatoid arthritis (HCC)    Sleep apnea    SOB (shortness of breath)    Swelling of both ankles    Tinnitus    UTI (lower urinary tract infection) 02/17/2014   Vertigo    last episode  was 2 weeks ago    Vitamin D deficiency    Wears contact lenses    left eye only    Wears hearing aid in both ears      Past Surgical History:  Procedure Laterality Date   ABDOMINAL HYSTERECTOMY  2004   complete   ACHILLES TENDON REPAIR Right    APPENDECTOMY  2004   BREAST EXCISIONAL BIOPSY     BREAST LUMPECTOMY Left    2015   BREAST LUMPECTOMY  WITH NEEDLE LOCALIZATION AND AXILLARY SENTINEL LYMPH NODE BX Left 02/23/2014   Procedure: BREAST LUMPECTOMY WITH NEEDLE LOCALIZATION AND AXILLARY SENTINEL LYMPH NODE BIOPSY;  Surgeon: Glenna Fellows, MD;  Location: [facility];  Service: General;  Laterality: Left;   BREAST SURGERY  2011   left breast biposy   CHOLECYSTECTOMY  1990   COLONOSCOPY W/ POLYPECTOMY  06/2009   EYE SURGERY Right 2013   exc. warts from underneath eyelid   INCONTINENCE SURGERY  2004   KNEE ARTHROSCOPY Bilateral    x 6 each knee   LIGAMENT REPAIR Right    thumb/wrist   ORIF TOE FRACTURE Right    great toe   PORTACATH PLACEMENT N/A 03/24/2014   Procedure: INSERTION PORT-A-CATH;  Surgeon: Glenna Fellows, MD;  Location: [facility];  Service: General;  Laterality: N/A;; removed    THYROIDECTOMY  2000   TONSILLECTOMY AND ADENOIDECTOMY  2000   TOTAL KNEE ARTHROPLASTY Left 12/03/2017   Procedure: LEFT TOTAL KNEE ARTHROPLASTY;  Surgeon: Ollen Gross, MD;  Location: [facility];  Service: Orthopedics;  Laterality: Left;   TUBAL LIGATION  1981   TUMOR EXCISION     from thoracic spine    Family History  Problem Relation Age of Onset   Alcohol abuse Mother    Arthritis Mother    Hypertension Mother    Bipolar disorder Mother    Breast cancer Mother 53       unconfirmed   Lung cancer Mother 78  smoker   Hyperlipidemia Mother    Stroke Mother    Cancer Mother    Depression Mother    Anxiety disorder Mother    Alcoholism Mother    Drug abuse Mother    Eating disorder Mother    Obesity Mother    Alcohol abuse Father    Hyperlipidemia Father    Kidney disease Father    Diabetes Father    Hypertension Father    Cystic kidney disease Father    Thyroid disease Father    Liver disease Father    Alcoholism Father    Lung cancer Father    Thyroid cancer Sister 76       type?; currently 42   Other Sister        ovarian tumor @ 53; TAH/BSO   Breast cancer Sister     Thyroid cancer Brother        dx 30s; currently 27   Breast cancer Maternal Aunt        dx 77s; deceased 66   Thyroid cancer Paternal Aunt        All 3 paternal aunts with thyroid ca in 30s/40s   Lung cancer Paternal Aunt        2 of 3 paternal aunts with lung cancer   Arthritis Maternal Grandmother    Diabetes Paternal Grandmother    Heart disease Other    COPD Other    Asthma Other     Social History   Tobacco Use   Smoking status: Never   Smokeless tobacco: Never  Vaping Use   Vaping Use: Never used  Substance Use Topics   Alcohol use: No    Alcohol/week: 0.0 standard drinks of alcohol   Drug use: No    ROS   Objective:   Vitals: BP 125/79 (BP Location: Right Arm)   Pulse 78   Temp 98 F (36.7 C) (Oral)   Resp 18   SpO2 95%   Physical Exam Constitutional:      General: She is not in acute distress.    Appearance: Normal appearance. She is well-developed. She is not ill-appearing, toxic-appearing or diaphoretic.  HENT:     Head: Normocephalic and atraumatic.     Nose: Nose normal.     Mouth/Throat:     Mouth: Mucous membranes are moist.  Eyes:     General: No scleral icterus.       Right eye: No discharge.        Left eye: No discharge.     Extraocular Movements: Extraocular movements intact.  Cardiovascular:     Rate and Rhythm: Normal rate.  Pulmonary:     Effort: Pulmonary effort is normal.  Musculoskeletal:       Hands:  Skin:    General: Skin is warm and dry.  Neurological:     General: No focal deficit present.     Mental Status: She is alert and oriented to person, place, and time.  Psychiatric:        Mood and Affect: Mood normal.        Behavior: Behavior normal.    PROCEDURE NOTE: laceration repair Verbal consent obtained from patient.  Local anesthesia with 4cc Lidocaine 1% without epinephrine.  Wound explored for tendon, ligament damage. Wound scrubbed with soap and water and rinsed. Wound closed with #2 5-0 Prolene (horizontal  mattress) sutures.  Wound cleansed and dressed.  No wheezing observed or notable allergic reaction from use of lidocaine. Patient did very well with  this without incident.   Full ROM prior to and after laceration repair.   Assessment and Plan :   PDMP not reviewed this encounter.  1. Pain of right thumb   2. Laceration of right thumb without complication, initial encounter    Laceration repaired successfully. Wound care reviewed. Recommended Tylenol for pain control. Return-to-clinic precautions discussed, patient verbalized understanding. Otherwise, follow up in 10 days for suture removal. Counseled patient on potential for adverse effects with medications prescribed/recommended today, ER and return-to-clinic precautions discussed, patient verbalized understanding.    Wallis Bamberg, [place] 09/21/22 1123

## 2022-09-21 NOTE — Discharge Instructions (Signed)

## 2022-09-21 NOTE — ED Triage Notes (Addendum)
Right thumb laceration from a box cutter. Pt is on clopidogrel last taken this morning. Finger is covered with tissue. Bleeding is controlled.

## 2022-09-25 ENCOUNTER — Ambulatory Visit (INDEPENDENT_AMBULATORY_CARE_PROVIDER_SITE_OTHER): Payer: Medicare Other | Admitting: Family Medicine

## 2022-09-25 ENCOUNTER — Encounter: Payer: Self-pay | Admitting: Family Medicine

## 2022-09-25 VITALS — BP 132/76 | HR 70 | Temp 97.7°F | Ht 67.0 in | Wt 254.0 lb

## 2022-09-25 DIAGNOSIS — J069 Acute upper respiratory infection, unspecified: Secondary | ICD-10-CM | POA: Diagnosis not present

## 2022-09-25 DIAGNOSIS — J4521 Mild intermittent asthma with (acute) exacerbation: Secondary | ICD-10-CM | POA: Diagnosis not present

## 2022-09-25 MED ORDER — CEPHALEXIN 500 MG PO CAPS
500.0000 mg | ORAL_CAPSULE | Freq: Two times a day (BID) | ORAL | 0 refills | Status: AC
Start: 2022-09-25 — End: 2022-10-05

## 2022-09-25 MED ORDER — PREDNISONE 20 MG PO TABS
40.0000 mg | ORAL_TABLET | Freq: Every day | ORAL | 0 refills | Status: DC
Start: 2022-09-25 — End: 2022-09-29

## 2022-09-25 NOTE — Progress Notes (Signed)
Acute Office Visit  Subjective:     Patient ID: Michelle Kennedy, female    DOB: 1955-08-17, 67 y.o.   MRN: [id number]  Chief Complaint  Patient presents with   Sore Throat     Patient is in today for sore throat, wheezing.   Patient states that last Thursday she was around 2 children who tested positive for strep the next day. For the past few days she has had 5/10 sore throat, rhinorrhea, postnasal drainage. States she then started developing asthma symptoms with dry cough, wheezing, dyspnea with exertion, sinus pressure to maxillary sinuses. She denies fevers, chills, body aches, chest pain, GI/GU symptoms. She has gotten minimal improvement with tylenol, Afrin, Mucinex, albuterol.     ROS All review of systems negative except what is listed in the HPI      Objective:    BP 132/76   Pulse 70   Temp 97.7 F (36.5 C) (Oral)   Ht 5\' 7"  (1.702 m)   Wt 254 lb (115.2 kg)   SpO2 99%   BMI 39.78 kg/m    Physical Exam Vitals reviewed.  Constitutional:      Appearance: Normal appearance. She is obese.  HENT:     Head: Normocephalic and atraumatic.     Right Ear: Tympanic membrane normal.     Left Ear: Tympanic membrane normal.     Mouth/Throat:     Mouth: Mucous membranes are moist.     Pharynx: Oropharynx is clear. Uvula midline. Posterior oropharyngeal erythema present. No pharyngeal swelling.  Eyes:     Conjunctiva/sclera: Conjunctivae normal.  Cardiovascular:     Rate and Rhythm: Normal rate and regular rhythm.     Pulses: Normal pulses.     Heart sounds: Normal heart sounds.  Pulmonary:     Effort: Pulmonary effort is normal.     Breath sounds: Wheezing present.  Skin:    General: Skin is warm and dry.  Neurological:     Mental Status: She is alert and oriented to person, place, and time.  Psychiatric:        Mood and Affect: Mood normal.        Behavior: Behavior normal.        Thought Content: Thought content normal.        Judgment: Judgment  normal.     No results found for any visits on 09/25/22.      Assessment & Plan:   Problem List Items Addressed This Visit   None Visit Diagnoses     Upper respiratory tract infection, unspecified type    -  Primary Rapid strep test negative Given the severity of your symptoms and known strep exposure, will go ahead and send in Keflex for you (you report that you can tolerate this antibiotic). Recommend adding a probiotic. Continue supportive measures including rest, hydration, humidifier use, steam showers, warm compresses to sinuses, warm liquids with lemon and honey, and over-the-counter cough, cold, and analgesics as needed.     Relevant Medications   cephALEXin (KEFLEX) 500 MG capsule   predniSONE (DELTASONE) 20 MG tablet   Mild intermittent asthma with exacerbation     Adding prednisone for asthma exacerbation. Continue as needed albuterol.    Relevant Medications   predniSONE (DELTASONE) 20 MG tablet       Patient aware of signs/symptoms requiring further/urgent evaluation.     Meds ordered this encounter  Medications   cephALEXin (KEFLEX) 500 MG capsule    Sig: Take 1 capsule (  500 mg total) by mouth 2 (two) times daily for 10 days.    Dispense:  20 capsule    Refill:  0    Order Specific Question:   Supervising Provider    Answer:   Danise Edge A [[id number]]   predniSONE (DELTASONE) 20 MG tablet    Sig: Take 2 tablets (40 mg total) by mouth daily with breakfast for 5 days.    Dispense:  10 tablet    Refill:  0    Order Specific Question:   Supervising Provider    Answer:   Danise Edge A [[id number]]    Return if symptoms worsen or fail to improve.  Clayborne Dana, NP

## 2022-09-25 NOTE — Patient Instructions (Addendum)
Rapid strep test negative.  Adding prednisone for asthma exacerbation. Continue as needed albuterol.  Given the severity of your symptoms and known strep exposure, will go ahead and send in Keflex for you (you report that you can tolerate this antibiotic). Recommend adding a probiotic. Continue supportive measures including rest, hydration, humidifier use, steam showers, warm compresses to sinuses, warm liquids with lemon and honey, and over-the-counter cough, cold, and analgesics as needed.   Please contact office for follow-up if symptoms do not improve or worsen. Seek emergency care if symptoms become severe.

## 2022-09-28 ENCOUNTER — Telehealth: Payer: Self-pay | Admitting: Family

## 2022-09-28 ENCOUNTER — Ambulatory Visit: Payer: Medicare Other | Admitting: Internal Medicine

## 2022-09-28 NOTE — Telephone Encounter (Signed)
Patient states she saw Hyman Hopes on 05/06 and she will out of medicine tomorrow but still does not feel better. She would like advise on what to do. Please advise.

## 2022-09-28 NOTE — Telephone Encounter (Signed)
Patient reports will be out of prednisone tomorrow and has a few more days of antibiotics but no improvement. Was scheduled to come back tomorrow to see pcp

## 2022-09-29 ENCOUNTER — Ambulatory Visit (HOSPITAL_BASED_OUTPATIENT_CLINIC_OR_DEPARTMENT_OTHER)
Admission: RE | Admit: 2022-09-29 | Discharge: 2022-09-29 | Disposition: A | Discharge status: Home / Self Care | Payer: Medicare Other | Source: Ambulatory Visit | Attending: Family | Admitting: Family

## 2022-09-29 ENCOUNTER — Ambulatory Visit (INDEPENDENT_AMBULATORY_CARE_PROVIDER_SITE_OTHER): Payer: Medicare Other | Admitting: Family

## 2022-09-29 VITALS — BP 117/64 | HR 81 | Temp 97.9°F | Resp 16 | Wt 252.0 lb

## 2022-09-29 DIAGNOSIS — J4541 Moderate persistent asthma with (acute) exacerbation: Secondary | ICD-10-CM

## 2022-09-29 MED ORDER — ALBUTEROL SULFATE (5 MG/ML) 0.5% IN NEBU: 2.5000 mg | INHALATION_SOLUTION | Freq: Four times a day (QID) | RESPIRATORY_TRACT | 3 refills | Status: DC | PRN

## 2022-09-29 MED ORDER — FLUTICASONE-SALMETEROL 500-50 MCG/ACT IN AEPB: 1.0000 | INHALATION_SPRAY | Freq: Two times a day (BID) | RESPIRATORY_TRACT | 2 refills | Status: DC

## 2022-09-29 MED ORDER — MONTELUKAST SODIUM 10 MG PO TABS: 10.0000 mg | ORAL_TABLET | Freq: Every day | ORAL | 3 refills | Status: DC

## 2022-09-29 MED ORDER — PREDNISONE 10 MG PO TABS: ORAL_TABLET | ORAL | 0 refills | Status: DC

## 2022-09-29 NOTE — Progress Notes (Signed)
Subjective:   By signing my name below, I, Michelle Kennedy, attest that this documentation has been prepared under the direction and in the presence of Michelle Fillers, NP 09/29/22   Patient ID: Michelle Kennedy, female    DOB: 1956/02/07, 67 y.o.   MRN: [id number]  Chief Complaint  Patient presents with   Cough    Patient complains of persistent cough, finished with prednisone still taking cephalexin   Nasal Congestion    Complains of congestion    HPI Patient is in today for an office visit.   Cough/Wheeze:  She complains of cough, wheeze, and painful breathing. She was last seen by Hyman Hopes, NP on 5/6 but states her symptoms have not resolved. She was meant to finish her Prednisone today. She also endorses nasal congestion and drainage. She has been using her nebulizer once every 6 hours and taking her Wixela twice daily.   Past Medical History:  Diagnosis Date   Abnormally small mouth    Achilles tendon disorder, right    Allergy    Anemia    Anxiety    Arthritis    hips and knees   Asthma    daily inhaler, prn inhaler and neb.   Asthma due to environmental allergies    Basal cell carcinoma    nose, s/p mohs.   Breast cancer (HCC) 01/2014   left   Cataract    Chest pain    CHF (congestive heart failure) (HCC)    COPD (chronic obstructive pulmonary disease) (HCC)    Dental bridge present    upper front and lower right   Dental crowns present    x 3   Depression    Esophageal spasm    reports since her chemo and breast surgery  she developed esophageal spasms and reports this is in the past has casue her 02 to desat in the  70s , denies sycnope in relation to this , does report hx of vertigo as well    Family history of anesthesia complication    twin brother aspirated and died on OR table, per pt.   Fibromyalgia    Gallbladder disease    Gallstones    GERD (gastroesophageal reflux disease)    Glaucoma    H/O blood clots    had blood to  PICC line     History of gastric ulcer    as a teenager   History of seizure age 3   as a reaction to Penicillin - no seizures since   History of stomach ulcers    History of thyroid cancer    s/p thyroidectomy   HTN (hypertension)    Hyperlipidemia    Hypothyroidism    Joint pain    Left knee injury    Liver disorder    seen when she had her hysterectomy , reports she was told it was  " small lesion" ; but asymptomatic    Lymphedema    left arm   Migraines    Multiple food allergies    Obesity    OSA (obstructive sleep apnea) 02/16/2016   CPAP   Palpitations    reports no longer experiences   Personal history of chemotherapy 2015   Personal history of radiation therapy 2015   Left   Prediabetes    Rheumatoid arthritis (HCC)    Sleep apnea    SOB (shortness of breath)    Swelling of both ankles    Tinnitus    UTI (  lower urinary tract infection) 02/17/2014   Vertigo    last episode  was 2 weeks ago    Vitamin D deficiency    Wears contact lenses    left eye only    Wears hearing aid in both ears     Past Surgical History:  Procedure Laterality Date   ABDOMINAL HYSTERECTOMY  2004   complete   ACHILLES TENDON REPAIR Right    APPENDECTOMY  2004   BREAST EXCISIONAL BIOPSY     BREAST LUMPECTOMY Left    2015   BREAST LUMPECTOMY WITH NEEDLE LOCALIZATION AND AXILLARY SENTINEL LYMPH NODE BX Left 02/23/2014   Procedure: BREAST LUMPECTOMY WITH NEEDLE LOCALIZATION AND AXILLARY SENTINEL LYMPH NODE BIOPSY;  Surgeon: Glenna Fellows, MD;  Location: [facility];  Service: General;  Laterality: Left;   BREAST SURGERY  2011   left breast biposy   CHOLECYSTECTOMY  1990   COLONOSCOPY W/ POLYPECTOMY  06/2009   EYE SURGERY Right 2013   exc. warts from underneath eyelid   INCONTINENCE SURGERY  2004   KNEE ARTHROSCOPY Bilateral    x 6 each knee   LIGAMENT REPAIR Right    thumb/wrist   ORIF TOE FRACTURE Right    great toe   PORTACATH PLACEMENT N/A 03/24/2014   Procedure:  INSERTION PORT-A-CATH;  Surgeon: Glenna Fellows, MD;  Location: [facility];  Service: General;  Laterality: N/A;; removed    THYROIDECTOMY  2000   TONSILLECTOMY AND ADENOIDECTOMY  2000   TOTAL KNEE ARTHROPLASTY Left 12/03/2017   Procedure: LEFT TOTAL KNEE ARTHROPLASTY;  Surgeon: Ollen Gross, MD;  Location: [facility];  Service: Orthopedics;  Laterality: Left;   TUBAL LIGATION  1981   TUMOR EXCISION     from thoracic spine    Family History  Problem Relation Age of Onset   Alcohol abuse Mother    Arthritis Mother    Hypertension Mother    Bipolar disorder Mother    Breast cancer Mother 73       unconfirmed   Lung cancer Mother 44       smoker   Hyperlipidemia Mother    Stroke Mother    Cancer Mother    Depression Mother    Anxiety disorder Mother    Alcoholism Mother    Drug abuse Mother    Eating disorder Mother    Obesity Mother    Alcohol abuse Father    Hyperlipidemia Father    Kidney disease Father    Diabetes Father    Hypertension Father    Cystic kidney disease Father    Thyroid disease Father    Liver disease Father    Alcoholism Father    Lung cancer Father    Thyroid cancer Sister 58       type?; currently 18   Other Sister        ovarian tumor @ 39; TAH/BSO   Breast cancer Sister    Thyroid cancer Brother        dx 30s; currently 62   Breast cancer Maternal Aunt        dx 8s; deceased 78   Thyroid cancer Paternal Aunt        All 3 paternal aunts with thyroid ca in 30s/40s   Lung cancer Paternal Aunt        2 of 3 paternal aunts with lung cancer   Arthritis Maternal Grandmother    Diabetes Paternal Grandmother    Heart disease Other    COPD  Other    Asthma Other     Social History   Socioeconomic History   Marital status: Married    Spouse name: Dayton Scrape   Number of children: 2   Years of education: Not on file   Highest education level: Not on file  Occupational History   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   Social Determinants of Health   Financial Resource Strain: Low Risk  (08/23/2022)   Overall Financial Resource Strain (CARDIA)    Difficulty of Paying Living Expenses: Not hard at all  Food Insecurity: No Food Insecurity (08/23/2022)   Hunger Vital Sign    Worried About Running Out of Food in the Last Year: Never true    Ran Out of Food in the Last Year: Never true  Transportation Needs: No Transportation Needs (08/23/2022)   PRAPARE - Administrator, Civil Service (Medical): No    Lack of Transportation (Non-Medical): No  Physical Activity: Sufficiently Active (08/23/2022)   Exercise Vital Sign    Days of Exercise per Week: 5 days    Minutes of Exercise per Session: 40 min  Stress: No Stress Concern Present (08/23/2022)   [employer] of Occupational Health - Occupational Stress Questionnaire    Feeling of Stress : Not at all  Social Connections: Unknown (08/23/2022)   Social Connection and Isolation Panel [NHANES]    Frequency of Communication with Friends and Family: Once a week    Frequency of Social Gatherings with Friends and Family: Never    Attends Religious Services: Not on Insurance claims handler of Clubs or Organizations: No    Attends Banker Meetings: Never    Marital Status: Married  Catering manager Violence: Not At Risk (08/24/2022)   Humiliation, Afraid, Rape, and Kick questionnaire    Fear of Current or Ex-Partner: No    Emotionally Abused: No     Physically Abused: No    Sexually Abused: No    Outpatient Medications Prior to Visit  Medication Sig Dispense Refill   albuterol (PROVENTIL) (5 MG/ML) 0.5% nebulizer solution Take 0.5 mLs (2.5 mg total) by nebulization every 6 (six) hours as needed for wheezing or shortness of breath. 40 mL 3   albuterol (VENTOLIN HFA) 108 (90 Base) MCG/ACT inhaler USE 2 INHALATIONS ORALLY   EVERY 6 HOURS AS NEEDED FORWHEEZING OR SHORTNESS OF   BREATH. 54 g 1   amLODipine (NORVASC) 5 MG tablet Take 1 tablet (5 mg total) by mouth daily. 90 tablet 1   betamethasone valerate ointment (VALISONE) 0.1 % Apply 1 Application topically 2 (two) times daily. 30 g 2   CALCIUM PO Take 1 tablet by mouth every evening.     cephALEXin (KEFLEX) 500 MG capsule Take 1 capsule (500 mg total) by mouth 2 (two) times daily for 10 days. 20 capsule 0  cholecalciferol (VITAMIN D3) 25 MCG (1000 UNIT) tablet Take 1,000 Units by mouth every evening.      clopidogrel (PLAVIX) 75 MG tablet TAKE 1 TABLET EVERY DAY 90 tablet 1   EPINEPHrine 0.3 mg/0.3 mL IJ SOAJ injection Inject 0.3 mg into the muscle as needed for anaphylaxis. 2 each 1   furosemide (LASIX) 20 MG tablet TAKE 1 TABLET(20 MG) BY MOUTH DAILY AS NEEDED FOR SWELLING 30 tablet 3   gabapentin (NEURONTIN) 100 MG capsule TAKE 2 CAPSULES THREE TIMES DAILY 540 capsule 3   latanoprost (XALATAN) 0.005 % ophthalmic solution 1 drop at bedtime.     levothyroxine (SYNTHROID) 150 MCG tablet TAKE 1 TABLET EVERY DAY 30 tablet 0   lisinopril (ZESTRIL) 20 MG tablet TAKE 2 TABLETS EVERY DAY 180 tablet 1   meclizine (ANTIVERT) 25 MG tablet Take 1 tablet (25 mg total) by mouth 3 (three) times daily as needed for dizziness. 30 tablet 0   metFORMIN (GLUCOPHAGE) 500 MG tablet Take 1 tablet (500 mg total) by mouth daily with breakfast. 90 tablet 0   methocarbamol (ROBAXIN) 500 MG tablet Take 1 tablet (500 mg total) by mouth every 8 (eight) hours as needed for muscle spasms. 20 tablet 0   Multiple  Vitamin (MULTIVITAMIN ADULT) TABS Take 1 tablet by mouth daily.     Multiple Vitamins-Minerals (OCUVITE PO) Take 1 tablet by mouth daily.     Omega-3 Fatty Acids (FISH OIL) 1000 MG CAPS Take 1 capsule by mouth every evening.      ondansetron (ZOFRAN) 4 MG tablet Take 1 tablet (4 mg total) by mouth every 8 (eight) hours as needed for nausea or vomiting. 20 tablet 0   pantoprazole (PROTONIX) 40 MG tablet TAKE 1 TABLET EVERY DAY 90 tablet 1   rosuvastatin (CRESTOR) 5 MG tablet Take 1 tablet (5 mg total) by mouth daily. 90 tablet 3   Semaglutide, 2 MG/DOSE, 8 MG/3ML SOPN Inject 2 mg as directed once a week. 3 mL 0   terbinafine (LAMISIL) 250 MG tablet Take 1 tablet (250 mg total) by mouth daily. 60 tablet 0   traMADol (ULTRAM) 50 MG tablet Take 1 tablet (50 mg total) by mouth every 6 (six) hours as needed for moderate pain or severe pain. 20 tablet 0   fluticasone-salmeterol (WIXELA INHUB) 250-50 MCG/ACT AEPB Inhale 1 puff into the lungs in the morning and at bedtime. 180 each 6   predniSONE (DELTASONE) 20 MG tablet Take 2 tablets (40 mg total) by mouth daily with breakfast for 5 days. 10 tablet 0   No facility-administered medications prior to visit.    Allergies  Allergen Reactions   Bee Venom Anaphylaxis   Betadine [Povidone Iodine] Anaphylaxis    Rash. Breathing problems.    Codeine Shortness Of Breath and Rash   Contrast Media [Iodinated Contrast Media] Shortness Of Breath   Gadolinium Derivatives Hives, Rash, Shortness Of Breath and Swelling    "my heart stopped beating"    Iodine Other (See Comments)    CARDIAC ARREST   Latex Anaphylaxis and Rash   Lidocaine Shortness Of Breath and Swelling    SWELLING OF MOUTH AND THROAT, low BP   Penicillins Shortness Of Breath, Rash and Other (See Comments)    SEIZURE Did it involve swelling of the face/tongue/throat, SOB, or low BP? no Did it involve sudden or severe rash/hives, skin peeling, or any reaction on the inside of your mouth or  nose? yes Did you need to seek medical attention at a  hospital or doctor's office? yes When did it last happen?      2010 If all above answers are "NO", may proceed with cephalosporin use.    Pentazocine Lactate Shortness Of Breath and Rash   Shellfish Allergy Shortness Of Breath and Rash   Aspirin Rash and Other (See Comments)    GI UPSET   Erythromycin Swelling and Rash    SWELLING OF JOINTS   Symbicort [Budesonide-Formoterol Fumarate] Other (See Comments)    BURNING OF TONGUE AND LIPS   Compazine [Prochlorperazine Maleate] Rash    Rash on face,chest, arms, back   Sulfonamide Derivatives Rash    Review of Systems  HENT:  Positive for congestion (nasal congestion with drainage).   Respiratory:  Positive for cough and wheezing (bilateral expiratory wheeze).        Objective:    Physical Exam Constitutional:      General: She is not in acute distress.    Appearance: Normal appearance. She is well-developed.  HENT:     Head: Normocephalic and atraumatic.     Right Ear: External ear normal.     Left Ear: External ear normal.  Eyes:     General: No scleral icterus. Neck:     Thyroid: No thyromegaly.  Cardiovascular:     Rate and Rhythm: Normal rate and regular rhythm.     Heart sounds: Normal heart sounds. No murmur heard. Pulmonary:     Effort: Pulmonary effort is normal. No respiratory distress.     Breath sounds: Wheezing (Bilateral expiratory wheeze) present.  Musculoskeletal:     Cervical back: Neck supple.  Skin:    General: Skin is warm and dry.  Neurological:     Mental Status: She is alert and oriented to person, place, and time.  Psychiatric:        Mood and Affect: Mood normal.        Behavior: Behavior normal.        Thought Content: Thought content normal.        Judgment: Judgment normal.     BP 117/64 (BP Location: Right Arm, Patient Position: Sitting, Cuff Size: Large)   Pulse 81   Temp 97.9 F (36.6 C) (Oral)   Resp 16   Wt 252 lb (114.3 kg)    SpO2 98%   BMI 39.47 kg/m  Wt Readings from Last 3 Encounters:  09/29/22 252 lb (114.3 kg)  09/25/22 254 lb (115.2 kg)  09/18/22 255 lb 1.6 oz (115.7 kg)       Assessment & Plan:  Moderate persistent asthma with acute exacerbation -     DG Chest 2 View; Future  Other orders -     predniSONE; 3 tabs by mouth once daily for 3 days, then 2 tabs once daily for 3 days, then 1 tab daily for 3 days.  Dispense: 18 tablet; Refill: 0 -     Montelukast Sodium; Take 1 tablet (10 mg total) by mouth at bedtime.  Dispense: 30 tablet; Refill: 3 -     Fluticasone-Salmeterol; Inhale 1 puff into the lungs in the morning and at bedtime.  Dispense: 60 each; Refill: 2  Uncontrolled. Will obtain x-ray to rule out PNA.  Will extend prednisone taper, add singulair, increase wixela from 250mg  to 500 mg.   I,Rachel Rivera,acting as a scribe for Michelle Fillers, NP.,have documented all relevant documentation on the behalf of Michelle Fillers, NP,as directed by  Michelle Fillers, NP while in the presence of Michelle Fillers,  NP.   I, Michelle Fillers, NP, personally preformed the services described in this documentation.  All medical record entries made by the scribe were at my direction and in my presence.  I have reviewed the chart and discharge instructions (if applicable) and agree that the record reflects my personal performance and is accurate and complete. 09/29/22   Michelle Fillers, NP

## 2022-09-29 NOTE — Addendum Note (Signed)
Addended by: Sandford Craze on: 09/29/2022 03:43 PM   Modules accepted: Orders

## 2022-09-29 NOTE — Telephone Encounter (Signed)
ATC x1 left a VM for patient to call our office back.

## 2022-10-01 ENCOUNTER — Other Ambulatory Visit: Payer: Self-pay | Admitting: Family

## 2022-10-02 ENCOUNTER — Ambulatory Visit
Admission: EM | Admit: 2022-10-02 | Discharge: 2022-10-02 | Disposition: A | Discharge status: Home / Self Care | Payer: Medicare Other

## 2022-10-02 DIAGNOSIS — Z4802 Encounter for removal of sutures: Secondary | ICD-10-CM | POA: Diagnosis not present

## 2022-10-02 NOTE — ED Notes (Addendum)
2 suture removed from right thumb. Wound clean, dry and well healed.

## 2022-10-02 NOTE — ED Triage Notes (Signed)
Pt presents for suture removal in right thumb. Denies, pain, drainage, chills, fever.

## 2022-10-03 ENCOUNTER — Encounter: Payer: Self-pay | Admitting: Internal Medicine

## 2022-10-03 ENCOUNTER — Ambulatory Visit (INDEPENDENT_AMBULATORY_CARE_PROVIDER_SITE_OTHER): Payer: Medicare Other | Admitting: Bariatrics

## 2022-10-03 ENCOUNTER — Encounter: Payer: Self-pay | Admitting: Bariatrics

## 2022-10-03 ENCOUNTER — Ambulatory Visit (INDEPENDENT_AMBULATORY_CARE_PROVIDER_SITE_OTHER): Payer: Medicare Other | Admitting: Internal Medicine

## 2022-10-03 VITALS — BP 130/86 | HR 75 | Ht 66.0 in | Wt 251.2 lb

## 2022-10-03 VITALS — BP 116/74 | HR 62 | Temp 97.6°F | Ht 66.0 in | Wt 246.0 lb

## 2022-10-03 DIAGNOSIS — Z6839 Body mass index (BMI) 39.0-39.9, adult: Secondary | ICD-10-CM | POA: Diagnosis not present

## 2022-10-03 DIAGNOSIS — C73 Malignant neoplasm of thyroid gland: Secondary | ICD-10-CM | POA: Diagnosis not present

## 2022-10-03 DIAGNOSIS — E890 Postprocedural hypothyroidism: Secondary | ICD-10-CM | POA: Diagnosis not present

## 2022-10-03 DIAGNOSIS — R0602 Shortness of breath: Secondary | ICD-10-CM

## 2022-10-03 DIAGNOSIS — R7303 Prediabetes: Secondary | ICD-10-CM

## 2022-10-03 LAB — POCT GLYCOSYLATED HEMOGLOBIN (HGB A1C): Hemoglobin A1C: 5.7 % — AB (ref 4.0–5.6)

## 2022-10-03 MED ORDER — SEMAGLUTIDE (2 MG/DOSE) 8 MG/3ML ~~LOC~~ SOPN
2.0000 mg | PEN_INJECTOR | SUBCUTANEOUS | 0 refills | Status: DC
Start: 2022-10-03 — End: 2022-10-28

## 2022-10-03 MED ORDER — ONDANSETRON HCL 4 MG PO TABS
4.0000 mg | ORAL_TABLET | Freq: Three times a day (TID) | ORAL | 0 refills | Status: DC | PRN
Start: 2022-10-03 — End: 2022-11-15

## 2022-10-03 NOTE — Patient Instructions (Signed)
Please continue Levothyroxine 150 mcg daily.  Take the thyroid hormone every day, with water, at least 30 minutes before breakfast, separated by at least 4 hours from: - acid reflux medications - calcium - iron - multivitamins  Please stop at the lab.  Please return in 6 months.   

## 2022-10-03 NOTE — Progress Notes (Signed)
Patient ID: Michelle Kennedy, female   DOB: 05/16/1956, 67 y.o.   MRN: [id number]   HPI  Michelle Kennedy is a 67 y.o.-year-old female, returning for follow-up for h/o papillary thyroid cancer, postsurgical hypothyroidism, prediabetes. She saw Dr. Corwin Levins before. Last visit with me 9 months ago.  Interim history: No  blurry vision, nausea, chest pain.   She has no increased urination - implant in back >> incontinence resolved. She continues to lose weight on Ozempic, prescribed by the weight management clinic. The dose was increased 3 weeks ago. She is eating a 1400 calorie diet, exercising 6 days a week, drinking plenty of water.  She had a steroid taper last week for SOB after one of the pipes in her house first and flooded few of her rooms. She is preparing to go to [place] for 4 months.  Thyroid cancer: Reviewed history: Pt. has been dx with ThyCA in 2002, and had total thyroidectomy then [facility]). No RAI tx 2/2 severe iodine allergy reportedly, no postop U/S's checked.  Neck U/S (12/2016): Complete thyroidectomy without significant abnormality by ultrasound  Thyroglobulin levels are low, but detectable:  Lab Results  Component Value Date   THYROGLB 0.1 (L) 02/02/2022   THYROGLB 0.1 (L) 03/15/2021   THYROGLB 0.1 (L) 12/09/2019   THYROGLB 0.3 (L) 07/15/2019   THYROGLB 0.1 (L) 09/17/2017   THYROGLB 0.1 (L) 12/12/2016   Lab Results  Component Value Date   THGAB <1 02/02/2022   THGAB <1 03/15/2021   THGAB <1 12/09/2019   THGAB <1 07/15/2019   THGAB <1 09/17/2017   THGAB <1 12/12/2016  10/11/2015: Tg <0.1, ATA <1  Pt denies: - feeling nodules in neck - hoarseness - dysphagia - choking  Postsurgical hypothyroidism: She was previously on levothyroxine 200 mcg daily >> decreased to 175 mcg daily >> in 02/2021, I advised her to decrease the dose to 150 mcg daily, but she forgot.  I advised her to decrease the dose in 02/2021. At last visit, in 01/2022, she  was still on the 200 mcg LT4 >> advised her to decrease the dose to 150 mcg.  She takes the levothyroxine: - in am - fasting - at least 30 min from b'fast -+ Calcium at night - 1-2 Tums at bedtime 2x a week - + MVI at night - no PPIs at night - not on Biotin - on vitamin D   Reviewed patient's TFTs: Lab Results  Component Value Date   TSH 0.05 (L) 02/02/2022   TSH 0.36 07/20/2021   TSH 0.31 (L) 03/15/2021   TSH 0.65 03/01/2020   TSH 0.41 12/09/2019   TSH 0.20 (L) 07/15/2019   TSH 2.01 07/05/2018   TSH 6.40 (H) 05/30/2018   TSH 14.60 (H) 05/01/2018   TSH 0.82 09/17/2017   FREET4 1.35 02/02/2022   FREET4 1.13 07/20/2021   FREET4 1.18 03/15/2021   FREET4 1.13 12/09/2019   FREET4 1.14 07/15/2019   FREET4 1.12 09/17/2017   FREET4 0.87 03/20/2017   FREET4 0.90 12/12/2016   FREET4 1.16 09/20/2016   Prev. Very suppressed TSH levels per review of Care Everywhere records.  She has + FH of thyroid disorders in: siblings: goiter. + FH of thyroid cancer: Paternal aunts and uncle. No history of radiation therapy to head or neck . Had RxTx for BrCA (diagnosed in 2016)-but not to the neck.  Prediabetes:  Reviewed HbA1c: Lab Results  Component Value Date   HGBA1C 5.7 (H) 07/06/2022   HGBA1C 5.7 (A)  02/02/2022   HGBA1C 5.6 10/28/2021   HGBA1C 5.8 (H) 07/28/2021   HGBA1C 6.0 (A) 04/22/2021   HGBA1C 6.2 10/26/2020   HGBA1C 5.8 (H) 03/01/2020   HGBA1C 6.1 (H) 08/23/2019   HGBA1C 5.9 02/26/2018   HGBA1C 5.7 (H) 01/23/2017   On Metformin 500 mg 2x a day >> lightheadedness >> once a day >> stopped. She is on: Ozempic 2 mg weekly.  Pt checks her sugars 0-1x a day: - am: 60s, 70s-84 >> 100-130 >> 82-118 - 2h after b'fast: up to 180s >> n/c - before lunch: n/c >> 115-134 >> 122-131 - 2h after lunch: n/c - before dinner: n/c >> 113-140 >> 127-139 - 2h after dinner: up to 210 >> n/c - bedtime: n/c - nighttime: n/c Lowest sugar was 60s >> 82; she has hypoglycemia awareness at 70.   Highest sugar was 200s >> 140  Glucometer: Auvon  Pt's meals are: - Breakfast:  oatmeal + berries; egg mcmuffin + sausage + cheese; scrambled eggs + sausage + potatoes + peppers - Lunch: tomato soup, mac and cheese - Dinner: same  - Snacks: almonds, berry pie No sweet drinks. She saw a dietitian in the past.  - no CKD, last BUN/creatinine:  Lab Results  Component Value Date   BUN 17 07/06/2022   BUN 13 10/28/2021   CREATININE 0.95 07/06/2022   CREATININE 0.75 10/28/2021  On lisinopril 20 mg daily.  - + HL; last set of lipids: Lab Results  Component Value Date   CHOL 170 07/06/2022   HDL 73 07/06/2022   LDLCALC 81 07/06/2022   LDLDIRECT 117.0 02/26/2019   TRIG 85 07/06/2022   CHOLHDL 3 06/28/2020  On atorvastatin 40 mg daily.  - last eye exam was on 03/2022: No DR reportedly, + open-angle glaucoma, mild cataract right eye, + astigmatism.  - + numbness and tingling in her feet.  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  She is in a study for chemotherapy-induced neuropathy.  She is on Neurontin.  Pt has FH of DM in M, B, PGM.  She was seeing Dr. Quillian Quince in the Weight Loss clinic >> stopped She has fibromyalgia, RA, HTN, asthma.  She is on anastrozole for h/o breast cancer.  ROS: + See HPI  I reviewed pt's medications, allergies, PMH, social hx, family hx, and changes were documented in the history of present illness. Otherwise, unchanged from my initial visit note.  Past Medical History:  Diagnosis Date   Abnormally small mouth    Achilles tendon disorder, right    Allergy    Anemia    Anxiety    Arthritis    hips and knees   Asthma    daily inhaler, prn inhaler and neb.   Asthma due to environmental allergies    Basal cell carcinoma    nose, s/p mohs.   Breast cancer (HCC) 01/2014   left   Cataract    Chest pain    CHF (congestive heart failure) (HCC)    COPD (chronic obstructive pulmonary disease) (HCC)    Dental bridge  present    upper front and lower right   Dental crowns present    x 3   Depression    Esophageal spasm    reports since her chemo and breast surgery  she developed esophageal spasms and reports this is in the past has casue her 02 to desat in the  70s , denies sycnope in relation to this , does report hx  of vertigo as well    Family history of anesthesia complication    twin brother aspirated and died on OR table, per pt.   Fibromyalgia    Gallbladder disease    Gallstones    GERD (gastroesophageal reflux disease)    Glaucoma    H/O blood clots    had blood to  PICC line    History of gastric ulcer    as a teenager   History of seizure age 78   as a reaction to Penicillin - no seizures since   History of stomach ulcers    History of thyroid cancer    s/p thyroidectomy   HTN (hypertension)    Hyperlipidemia    Hypothyroidism    Joint pain    Left knee injury    Liver disorder    seen when she had her hysterectomy , reports she was told it was  " small lesion" ; but asymptomatic    Lymphedema    left arm   Migraines    Multiple food allergies    Obesity    OSA (obstructive sleep apnea) 02/16/2016   CPAP   Palpitations    reports no longer experiences   Personal history of chemotherapy 2015   Personal history of radiation therapy 2015   Left   Prediabetes    Rheumatoid arthritis (HCC)    Sleep apnea    SOB (shortness of breath)    Swelling of both ankles    Tinnitus    UTI (lower urinary tract infection) 02/17/2014   Vertigo    last episode  was 2 weeks ago    Vitamin D deficiency    Wears contact lenses    left eye only    Wears hearing aid in both ears    Past Surgical History:  Procedure Laterality Date   ABDOMINAL HYSTERECTOMY  2004   complete   ACHILLES TENDON REPAIR Right    APPENDECTOMY  2004   BREAST EXCISIONAL BIOPSY     BREAST LUMPECTOMY Left    2015   BREAST LUMPECTOMY WITH NEEDLE LOCALIZATION AND AXILLARY SENTINEL LYMPH NODE BX Left 02/23/2014    Procedure: BREAST LUMPECTOMY WITH NEEDLE LOCALIZATION AND AXILLARY SENTINEL LYMPH NODE BIOPSY;  Surgeon: Glenna Fellows, MD;  Location: [facility];  Service: General;  Laterality: Left;   BREAST SURGERY  2011   left breast biposy   CHOLECYSTECTOMY  1990   COLONOSCOPY W/ POLYPECTOMY  06/2009   EYE SURGERY Right 2013   exc. warts from underneath eyelid   INCONTINENCE SURGERY  2004   KNEE ARTHROSCOPY Bilateral    x 6 each knee   LIGAMENT REPAIR Right    thumb/wrist   ORIF TOE FRACTURE Right    great toe   PORTACATH PLACEMENT N/A 03/24/2014   Procedure: INSERTION PORT-A-CATH;  Surgeon: Glenna Fellows, MD;  Location:  SURGERY CENTER;  Service: General;  Laterality: N/A;; removed    THYROIDECTOMY  2000   TONSILLECTOMY AND ADENOIDECTOMY  2000   TOTAL KNEE ARTHROPLASTY Left 12/03/2017   Procedure: LEFT TOTAL KNEE ARTHROPLASTY;  Surgeon: Ollen Gross, MD;  Location: [facility];  Service: Orthopedics;  Laterality: Left;   TUBAL LIGATION  1981   TUMOR EXCISION     from thoracic spine   Social History   Social History   Marital status: Married    Spouse name: N/A   Number of children: 2   Occupational History   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 40 mL 3   albuterol (VENTOLIN HFA) 108 (90 Base) MCG/ACT inhaler USE 2 INHALATIONS ORALLY   EVERY 6 HOURS AS NEEDED FORWHEEZING OR SHORTNESS OF   BREATH. 54 g 1   amLODipine (NORVASC) 5 MG tablet TAKE 1 TABLET EVERY DAY 90 tablet 1   betamethasone valerate ointment (VALISONE) 0.1 % Apply 1  Application topically 2 (two) times daily. 30 g 2   CALCIUM PO Take 1 tablet by mouth every evening.     cephALEXin (KEFLEX) 500 MG capsule Take 1 capsule (500 mg total) by mouth 2 (two) times daily for 10 days. 20 capsule 0   cholecalciferol (VITAMIN D3) 25 MCG (1000 UNIT) tablet Take 1,000 Units by mouth every evening.      clopidogrel (PLAVIX) 75 MG tablet TAKE 1 TABLET EVERY DAY 90 tablet 1   EPINEPHrine 0.3 mg/0.3 mL IJ SOAJ injection Inject 0.3 mg into the muscle as needed for anaphylaxis. 2 each 1   fluticasone-salmeterol (WIXELA INHUB) 500-50 MCG/ACT AEPB Inhale 1 puff into the lungs in the morning and at bedtime. 60 each 2   furosemide (LASIX) 20 MG tablet TAKE 1 TABLET(20 MG) BY MOUTH DAILY AS NEEDED FOR SWELLING 30 tablet 3   gabapentin (NEURONTIN) 100 MG capsule TAKE 2 CAPSULES THREE TIMES DAILY 540 capsule 3   latanoprost (XALATAN) 0.005 % ophthalmic solution 1 drop at bedtime.     levothyroxine (SYNTHROID) 150 MCG tablet TAKE 1 TABLET EVERY DAY 30 tablet 0   lisinopril (ZESTRIL) 20 MG tablet TAKE 2 TABLETS EVERY DAY 180 tablet 1   meclizine (ANTIVERT) 25 MG tablet Take 1 tablet (25 mg total) by mouth 3 (three) times daily as needed for dizziness. 30 tablet 0   metFORMIN (GLUCOPHAGE) 500 MG tablet Take 1 tablet (500 mg total) by mouth daily with breakfast. 90 tablet 0   methocarbamol (ROBAXIN) 500 MG tablet Take 1 tablet (500 mg total) by mouth every 8 (eight) hours as needed for muscle spasms. 20 tablet 0   montelukast (SINGULAIR) 10 MG tablet Take 1 tablet (10 mg total) by mouth at bedtime. 30 tablet 3   Multiple Vitamin (MULTIVITAMIN ADULT) TABS Take 1 tablet by mouth daily.     Multiple Vitamins-Minerals (OCUVITE PO) Take 1 tablet by mouth daily.     Omega-3 Fatty Acids (FISH OIL) 1000 MG CAPS Take 1 capsule by mouth every evening.      pantoprazole (PROTONIX) 40 MG tablet TAKE 1 TABLET EVERY DAY 90 tablet 1   predniSONE (DELTASONE) 10 MG tablet 3 tabs by mouth once daily for 3  days, then 2 tabs once daily for 3 days, then 1 tab daily for 3 days. 18 tablet 0   rosuvastatin (CRESTOR) 5 MG tablet Take 1 tablet (5 mg total) by mouth daily. 90 tablet 3   terbinafine (LAMISIL) 250 MG tablet Take 1 tablet (250 mg total) by mouth daily. 60 tablet 0   traMADol (ULTRAM) 50 MG tablet Take 1 tablet (50 mg total) by mouth every 6 (six) hours as needed for moderate pain  or severe pain. 20 tablet 0   No current facility-administered medications on file prior to visit.   Allergies  Allergen Reactions   Bee Venom Anaphylaxis   Betadine [Povidone Iodine] Anaphylaxis    Rash. Breathing problems.    Codeine Shortness Of Breath and Rash   Contrast Media [Iodinated Contrast Media] Shortness Of Breath   Gadolinium Derivatives Hives, Rash, Shortness Of Breath and Swelling    "my heart stopped beating"    Iodine Other (See Comments)    CARDIAC ARREST   Latex Anaphylaxis and Rash   Lidocaine Shortness Of Breath and Swelling    SWELLING OF MOUTH AND THROAT, low BP   Penicillins Shortness Of Breath, Rash and Other (See Comments)    SEIZURE Did it involve swelling of the face/tongue/throat, SOB, or low BP? no Did it involve sudden or severe rash/hives, skin peeling, or any reaction on the inside of your mouth or nose? yes Did you need to seek medical attention at a hospital or doctor's office? yes When did it last happen?      2010 If all above answers are "NO", may proceed with cephalosporin use.    Pentazocine Lactate Shortness Of Breath and Rash   Shellfish Allergy Shortness Of Breath and Rash   Aspirin Rash and Other (See Comments)    GI UPSET   Erythromycin Swelling and Rash    SWELLING OF JOINTS   Symbicort [Budesonide-Formoterol Fumarate] Other (See Comments)    BURNING OF TONGUE AND LIPS   Compazine [Prochlorperazine Maleate] Rash    Rash on face,chest, arms, back   Sulfonamide Derivatives Rash   Family History  Problem Relation Age of Onset   Alcohol abuse Mother     Arthritis Mother    Hypertension Mother    Bipolar disorder Mother    Breast cancer Mother 42       unconfirmed   Lung cancer Mother 65       smoker   Hyperlipidemia Mother    Stroke Mother    Cancer Mother    Depression Mother    Anxiety disorder Mother    Alcoholism Mother    Drug abuse Mother    Eating disorder Mother    Obesity Mother    Alcohol abuse Father    Hyperlipidemia Father    Kidney disease Father    Diabetes Father    Hypertension Father    Cystic kidney disease Father    Thyroid disease Father    Liver disease Father    Alcoholism Father    Lung cancer Father    Thyroid cancer Sister 28       type?; currently 30   Other Sister        ovarian tumor @ 65; TAH/BSO   Breast cancer Sister    Thyroid cancer Brother        dx 30s; currently 43   Breast cancer Maternal Aunt        dx 53s; deceased 5   Thyroid cancer Paternal Aunt        All 3 paternal aunts with thyroid ca in 30s/40s   Lung cancer Paternal Aunt        2 of 3 paternal aunts with lung cancer   Arthritis Maternal Grandmother    Diabetes Paternal Grandmother    Heart disease Other    COPD Other    Asthma Other    PE: BP 130/86 (BP Location: Right Arm, Patient Position: Sitting, Cuff Size: Normal)   Pulse 75  Ht 5\' 6"  (1.676 m)   Wt 251 lb 3.2 oz (113.9 kg)   SpO2 99%   BMI 40.54 kg/m  Wt Readings from Last 3 Encounters:  10/03/22 251 lb 3.2 oz (113.9 kg)  10/03/22 246 lb (111.6 kg)  09/29/22 252 lb (114.3 kg)   Constitutional: overweight, in NAD Eyes:  EOMI, no exophthalmos ENT: no neck masses, no cervical lymphadenopathy Cardiovascular: RRR, No MRG Respiratory: CTA B Musculoskeletal: no deformities Skin:no rashes Neurological: + slight tremor with outstretched L hand  ASSESSMENT: 1. H/O PTC  2. Hypothyroidism  3.  Prediabetes - R eye stroke 2021  She also has dCHF but unlikely as a complication of her prediabetes.  PLAN:  1. PTC -No detailed records; did not  have RAI treatment because of allergy to iodine -Previous thyroglobulin levels were undetectable as were her ATA antibodies per records reviewed from Dr. Katrinka Blazing.  Thyroglobulin increased afterwards with a level of 0.3 in 06/2019.  We discussed the it is not completely uncommon to have an occasional slightly higher globulin due to assay variability and due to the fact that she did not have RAI treatment.  We repeated her thyroglobulin in 11/2019 and this decreased to 0.1.  It remained stable in 02/2021 in 01/2022, 0.1, with undetectable antibodies. -We checked the neck ultrasound report from 2018 and 2023 and there were no metastases or recurrences in the neck -Will recheck thyroglobulin and ATA antibodies now  2. Patient with long standing, postsurgical hypothyroidism, on levothyroxine - latest thyroid labs reviewed with pt. >> TSH was suppressed on 200 mcg LT4 daily: Lab Results  Component Value Date   TSH 0.05 (L) 02/02/2022  - she continues on LT4 150 mcg daily, dose decreased 01/2022.  However, she did not return for labs afterwards as advised... - pt feels good on this dose. - we discussed about taking the thyroid hormone every day, with water, >30 minutes before breakfast, separated by >4 hours from acid reflux medications, calcium, iron, multivitamins. Pt. is taking it correctly. - will check thyroid tests today (although she had steroids recently, but we cannot wait longer due to the fact that she will soon leave the country for 4 months): TSH and fT4 - If labs are abnormal, she will need to return for repeat TFTs in 1.5 months  3.  Prediabetes -She has longstanding prediabetes -At last visit, HbA1c was lower, at 5.7%.  Had another HbA1c checked 3 months ago and this was stable, at 5.7%. -At that time, she was on Ozempic 0.5 g weekly prescribed by her weight management specialist.  She was preparing to increase the dose so I advised her that after increased Ozempic dose, if tolerated well,  to stop metformin 500 mg twice a day. -she is now off Metformin and her Ozempic dose was increased to 2 mg weekly -At today's visit, her sugars appear to be controlled, and improved from before -Further management per the weight management clinic.  Needs refills.  Component     Latest Ref Rng 10/03/2022  Hemoglobin A1C     4.0 - 5.6 % 5.7 !   TSH     0.35 - 5.50 uIU/mL 1.12   T4,Free(Direct)     0.60 - 1.60 ng/dL 1.61   Thyroglobulin Ab     < or = 1 IU/mL <1   Thyroglobulin     ng/mL 0.1 (L)   Comment --   Labs are stable, at goal.  Carlus Pavlov, MD PhD [facility] Endocrinology

## 2022-10-04 ENCOUNTER — Other Ambulatory Visit: Payer: Self-pay | Admitting: *Deleted

## 2022-10-04 LAB — THYROGLOBULIN LEVEL: Thyroglobulin: 0.1 ng/mL — ABNORMAL LOW

## 2022-10-04 LAB — THYROGLOBULIN ANTIBODY: Thyroglobulin Ab: <1 IU/mL (ref <=1)

## 2022-10-04 LAB — TSH: TSH: 1.12 u[IU]/mL (ref 0.35–5.50)

## 2022-10-04 LAB — T4, FREE: Free T4: 1.25 ng/dL (ref 0.60–1.60)

## 2022-10-04 MED ORDER — ALBUTEROL SULFATE (5 MG/ML) 0.5% IN NEBU: 2.5000 mg | INHALATION_SOLUTION | Freq: Four times a day (QID) | RESPIRATORY_TRACT | 3 refills | Status: DC | PRN

## 2022-10-04 NOTE — Progress Notes (Unsigned)
Chief Complaint:   OBESITY Michelle Kennedy is here to discuss her progress with her obesity treatment plan along with follow-up of her obesity related diagnoses. Michelle Kennedy is on the Category 4 Plan and states she is following her eating plan approximately 75-80% of the time. Michelle Kennedy states she is doing aerobics and yoga for 45 minutes 2-3 times per week.  Today's visit was #: 8 Starting weight: 275 lbs Starting date: 07/28/2021 Today's weight: 246 lbs Today's date: 10/03/2022 Total lbs lost to date: 29 Total lbs lost since last in-office visit: 7  Interim History: Michelle Kennedy is down 7 lbs since her last visit.   Subjective:   1. Prediabetes Michelle Kennedy is taking Semaglutide, and her fasting blood sugars range between 87-110.  2. SOB (shortness of breath) on exertion Michelle Kennedy is doing some exercise. Her actual RMR is 1886 and expected RMR is 1771 kcal.   Assessment/Plan:   1. Prediabetes We will refill Semaglutide and Zofran for 1 month.   - Semaglutide, 2 MG/DOSE, 8 MG/3ML SOPN; Inject 2 mg as directed once a week.  Dispense: 3 mL; Refill: 0 - ondansetron (ZOFRAN) 4 MG tablet; Take 1 tablet (4 mg total) by mouth every 8 (eight) hours as needed for nausea or vomiting.  Dispense: 20 tablet; Refill: 0  2. SOB (shortness of breath) on exertion We will discuss at her next visit, and she is to gradually increase her exercise.   3. Morbid obesity (HCC)  4. BMI 39.0-39.9,adult Michelle Kennedy is currently in the action stage of change. As such, her goal is to continue with weight loss efforts. She has agreed to keeping a food journal and adhering to recommended goals of 1600 calories and 90-100 grams of protein.   Mindful eating and intentional eating were discussed.   Exercise goals: As is.   Behavioral modification strategies: increasing lean protein intake, decreasing simple carbohydrates, increasing vegetables, increasing water intake, decreasing eating out, no skipping meals, meal planning and  cooking strategies, keeping healthy foods in the home, and planning for success.  Michelle Kennedy has agreed to follow-up with our clinic in 4 weeks. She was informed of the importance of frequent follow-up visits to maximize her success with intensive lifestyle modifications for her multiple health conditions.   Objective:   Blood pressure 116/74, pulse 62, temperature 97.6 F (36.4 C), height 5\' 6"  (1.676 m), weight 246 lb (111.6 kg), SpO2 96 %. Body mass index is 39.71 kg/m.  General: Cooperative, alert, well developed, in no acute distress. HEENT: Conjunctivae and lids unremarkable. Cardiovascular: Regular rhythm.  Lungs: Normal work of breathing. Neurologic: No focal deficits.   Lab Results  Component Value Date   CREATININE 0.95 07/06/2022   BUN 17 07/06/2022   NA 141 07/06/2022   K 4.7 07/06/2022   CL 103 07/06/2022   CO2 22 07/06/2022   Lab Results  Component Value Date   ALT 19 09/13/2022   AST 16 09/13/2022   ALKPHOS 58 09/13/2022   BILITOT 0.4 09/13/2022   Lab Results  Component Value Date   HGBA1C 5.7 (A) 10/03/2022   HGBA1C 5.7 (H) 07/06/2022   HGBA1C 5.7 (A) 02/02/2022   HGBA1C 5.6 10/28/2021   HGBA1C 5.8 (H) 07/28/2021   Lab Results  Component Value Date   INSULIN 18.0 07/06/2022   INSULIN 25.6 (H) 07/28/2021   INSULIN 21.8 09/20/2016   Lab Results  Component Value Date   TSH 0.05 (L) 02/02/2022   Lab Results  Component Value Date   CHOL 170 07/06/2022  HDL 73 07/06/2022   LDLCALC 81 07/06/2022   LDLDIRECT 117.0 02/26/2019   TRIG 85 07/06/2022   CHOLHDL 3 06/28/2020   Lab Results  Component Value Date   VD25OH 40.4 07/06/2022   VD25OH 53.4 07/28/2021   VD25OH 41.4 09/20/2016   Lab Results  Component Value Date   WBC 5.1 07/28/2021   HGB 14.3 07/28/2021   HCT 45.0 07/28/2021   MCV 76 (L) 07/28/2021   PLT 255 07/28/2021   Lab Results  Component Value Date   IRON 71 06/23/2020   TIBC 294 06/23/2020   FERRITIN 94.6 06/23/2020    Attestation Statements:   Reviewed by clinician on day of visit: allergies, medications, problem list, medical history, surgical history, family history, social history, and previous encounter notes.   Trude Mcburney, am acting as Energy manager for [employer], DO.  I have reviewed the above documentation for accuracy and completeness, and I agree with the above. Corinna Capra, DO

## 2022-10-05 ENCOUNTER — Encounter (HOSPITAL_BASED_OUTPATIENT_CLINIC_OR_DEPARTMENT_OTHER): Payer: Self-pay | Admitting: Physical Therapy

## 2022-10-05 ENCOUNTER — Ambulatory Visit (HOSPITAL_BASED_OUTPATIENT_CLINIC_OR_DEPARTMENT_OTHER): Payer: Medicare Other | Attending: Family Medicine | Admitting: Physical Therapy

## 2022-10-05 DIAGNOSIS — M25561 Pain in right knee: Secondary | ICD-10-CM | POA: Insufficient documentation

## 2022-10-05 DIAGNOSIS — R262 Difficulty in walking, not elsewhere classified: Secondary | ICD-10-CM | POA: Diagnosis present

## 2022-10-05 DIAGNOSIS — M6281 Muscle weakness (generalized): Secondary | ICD-10-CM | POA: Insufficient documentation

## 2022-10-05 NOTE — Therapy (Signed)
OUTPATIENT PHYSICAL THERAPY LOWER EXTREMITY TREATMENT NOTE   Patient Name: Michelle Kennedy MRN: [id number] DOB:05/16/1956, 67 y.o., female Today's Date: 10/05/2022  END OF SESSION:  PT End of Session - 10/05/22 1624     Visit Number 3    Number of Visits 13    Date for PT Re-Evaluation 10/30/22    Authorization Type MCR    PT Start Time 1530    PT Stop Time 1615    PT Time Calculation (min) 45 min    Activity Tolerance Patient tolerated treatment well    Behavior During Therapy WFL for tasks assessed/performed              Past Medical History:  Diagnosis Date   Abnormally small mouth    Achilles tendon disorder, right    Allergy    Anemia    Anxiety    Arthritis    hips and knees   Asthma    daily inhaler, prn inhaler and neb.   Asthma due to environmental allergies    Basal cell carcinoma    nose, s/p mohs.   Breast cancer (HCC) 01/2014   left   Cataract    Chest pain    CHF (congestive heart failure) (HCC)    COPD (chronic obstructive pulmonary disease) (HCC)    Dental bridge present    upper front and lower right   Dental crowns present    x 3   Depression    Esophageal spasm    reports since her chemo and breast surgery  she developed esophageal spasms and reports this is in the past has casue her 02 to desat in the  70s , denies sycnope in relation to this , does report hx of vertigo as well    Family history of anesthesia complication    twin brother aspirated and died on OR table, per pt.   Fibromyalgia    Gallbladder disease    Gallstones    GERD (gastroesophageal reflux disease)    Glaucoma    H/O blood clots    had blood to  PICC line    History of gastric ulcer    as a teenager   History of seizure age 38   as a reaction to Penicillin - no seizures since   History of stomach ulcers    History of thyroid cancer    s/p thyroidectomy   HTN (hypertension)    Hyperlipidemia    Hypothyroidism    Joint pain    Left knee injury    Liver  disorder    seen when she had her hysterectomy , reports she was told it was  " small lesion" ; but asymptomatic    Lymphedema    left arm   Migraines    Multiple food allergies    Obesity    OSA (obstructive sleep apnea) 02/16/2016   CPAP   Palpitations    reports no longer experiences   Personal history of chemotherapy 2015   Personal history of radiation therapy 2015   Left   Prediabetes    Rheumatoid arthritis (HCC)    Sleep apnea    SOB (shortness of breath)    Swelling of both ankles    Tinnitus    UTI (lower urinary tract infection) 02/17/2014   Vertigo    last episode  was 2 weeks ago    Vitamin D deficiency    Wears contact lenses    left eye only    Wears hearing  aid in both ears    Past Surgical History:  Procedure Laterality Date   ABDOMINAL HYSTERECTOMY  2004   complete   ACHILLES TENDON REPAIR Right    APPENDECTOMY  2004   BREAST EXCISIONAL BIOPSY     BREAST LUMPECTOMY Left    2015   BREAST LUMPECTOMY WITH NEEDLE LOCALIZATION AND AXILLARY SENTINEL LYMPH NODE BX Left 02/23/2014   Procedure: BREAST LUMPECTOMY WITH NEEDLE LOCALIZATION AND AXILLARY SENTINEL LYMPH NODE BIOPSY;  Surgeon: Glenna Fellows, MD;  Location: [facility];  Service: General;  Laterality: Left;   BREAST SURGERY  2011   left breast biposy   CHOLECYSTECTOMY  1990   COLONOSCOPY W/ POLYPECTOMY  06/2009   EYE SURGERY Right 2013   exc. warts from underneath eyelid   INCONTINENCE SURGERY  2004   KNEE ARTHROSCOPY Bilateral    x 6 each knee   LIGAMENT REPAIR Right    thumb/wrist   ORIF TOE FRACTURE Right    great toe   PORTACATH PLACEMENT N/A 03/24/2014   Procedure: INSERTION PORT-A-CATH;  Surgeon: Glenna Fellows, MD;  Location: [facility];  Service: General;  Laterality: N/A;; removed    THYROIDECTOMY  2000   TONSILLECTOMY AND ADENOIDECTOMY  2000   TOTAL KNEE ARTHROPLASTY Left 12/03/2017   Procedure: LEFT TOTAL KNEE ARTHROPLASTY;  Surgeon: Ollen Gross, MD;  Location: [facility];  Service: Orthopedics;  Laterality: Left;   TUBAL LIGATION  1981   TUMOR EXCISION     from thoracic spine   Patient Active Problem List   Diagnosis Date Noted   SOB (shortness of breath) on exertion 10/03/2022   Bronchitis 05/08/2022   Polyphagia 04/27/2022   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   Abdominal pannus 03/02/2022   Class 3 severe obesity with serious comorbidity and body mass index (BMI) of 40.0 to 44.9 in adult [facility]) 03/02/2022   Vertigo 02/07/2022   Peroneal tendinitis of left lower extremity 02/07/2022   Visceral obesity 02/01/2022   Sacral pain 09/20/2021   Pain in right knee 09/13/2021   Chronic left-sided low back pain with left-sided sciatica 04/27/2021   Nondisplaced fracture of middle phalanx of left lesser toe(s), initial encounter for closed fracture 09/17/2020   Chronic diastolic CHF (congestive heart failure) (HCC) 08/23/2019   Retinal artery occlusion 08/22/2019   History of total left knee replacement 12/07/2017   OA (osteoarthritis) of knee 12/03/2017   Morbid obesity (HCC) 01/01/2017   Essential hypertension 10/18/2016   Constipation 10/04/2016   Vitamin D deficiency 10/04/2016   Prediabetes 10/04/2016   BMI 39.0-39.9,adult 09/20/2016   OSA (obstructive sleep apnea) 02/16/2016   Malignant neoplasm of thyroid gland (HCC) 10/11/2015   De Quervain's tenosynovitis, bilateral 09/15/2015   Trigger finger, acquired 06/22/2015   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/10/2015   Postmenopausal osteoporosis 12/08/2014   Preventative health care 12/01/2014   DVT (deep venous thrombosis) (HCC) 08/28/2014   Lymphedema of arm 08/28/2014   Mucositis due to chemotherapy 06/02/2014   Menopausal syndrome (hot flashes) 12/31/2013   Breast cancer of upper-outer quadrant of left female breast (HCC) 12/26/2013   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   Gingivitis 12/05/2013   Seasonal allergies 09/24/2013    Hyperlipidemia 06/02/2011   Migraine 06/02/2011   Tinnitus of both ears 02/14/2011   Fibromyalgia 05/10/2010   Asthma 03/22/2010   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   GERD 12/20/2009   Depression 12/01/2009    PCP: Sandford Craze, NP   REFERRING PROVIDER:   Jordan Likes,  Marye Round, MD    REFERRING DIAG:  Diagnosis  M17.11 (ICD-10-CM) - Primary osteoarthritis of right knee    THERAPY DIAG:  Pain in joint of right knee  Difficulty walking  Muscle weakness (generalized)  Rationale for Evaluation and Treatment: Rehabilitation  ONSET DATE: 8 months ago  SUBJECTIVE:   SUBJECTIVE STATEMENT: Pt just had stitches come out yesterday from her 4/9 surgery. Interstim implant for bladder control.  Pt started exercise again for silver sneakers at the gym. Pt states knee pain is mild. Pt will be walking a lot coming in June. Pt has not done the LE machines at all. Has only done UE machines.   PERTINENT HISTORY: Gel and Zilretta injections; L TKA; bladder stimulator 3/8; fibro, HTN PAIN:  Are you having pain? no: NPRS scale: 0/10 Pain location: medial inferior patella  Pain description: aching Aggravating factors: stairs, inclines, especially down Relieving factors: icing, elevating, resting PRECAUTIONS: cannot lay supine due to bladder implant  WEIGHT BEARING RESTRICTIONS: No  FALLS:  Has patient fallen in last 6 months? 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 No acute osseus changes s/p  left knee  replacement without any sign of loosening   COGNITION: Overall cognitive status: Within functional limits for tasks assessed     SENSATION: WFL  TODAY'S TREATMENT:                                                                                                                              DATE:   5/16  Nustep 5 min warm up  White SL leg press 3x8 70lbs  SL knee extension 25lbs 3x8 SL hamstring curl 3x8 40lbs   SLS on airex 30s 3x 6" lateral step down 3x8  Edu about safe ROM with machines, machine set up, exercise tolerance, periodization parameters  08/25/22 Pt seen for aquatic therapy today.  Treatment took place in water 3.5-4.75 ft in depth at the [facility] pool. Temp of water was 91.  Pt entered/exited the pool via stairs independently in step-to pattern  with bilat rail. * without support:  walking forward/ backward/ side stepping / forward walking kicks * with yellow hand floats:  side step squat  L/R with arm addct; walking forward/ backward with single / bilat under water at side * side step with hydro band on below knees * squats with yellow hand float x 15 * Rt forward and lateral step ups - L retro step down x 15 each, UE on rail * bilat heel raises x  10, then bilat calf stretch * R/L quad stretch x 30s x 2 each (standing on blue step, foot on bench behind her) * R/L hamstring stretch x 30s x 2 each Pt requires the buoyancy and hydrostatic pressure of water for support, and to offload joints by unweighting joint load by at least 50 % in navel deep water and by at least 75-80% in chest to neck deep water.  Viscosity of the water is needed for resistance of strengthening. Water current perturbations provides challenge to standing balance requiring increased core activation.     PATIENT EDUCATION:  Education details: anatomy, exercise progression, DOMS expectations, HEP, POC Person educated: Patient Education method: Explanation, Demonstration, Tactile cues,  Verbal cues, and Handouts Education comprehension: verbalized understanding, returned demonstration, verbal cues required, and tactile cues required   HOME EXERCISE PROGRAM:  Access Code: [facility] URL: [email or web address] Date: 08/01/2022 Prepared by: Zebedee Iba   ASSESSMENT:   CLINICAL IMPRESSION:  Pt able to progress gym based strengthening exercise for the knee extensor mechanism, hamstrings, as well as for SL stability without pain. Pt does have noticeable weakness of the R LE compared to L. Pt given edu about starting with 90 degrees of flexion with CKC exercise as well as OKC exercise and to slowly work to great degrees of knee flexion weekly to progress strength through entire arc of motion. Pt did have more instability with SL step up type exercise so plan to review and progress SL stability at next land visit.   Pt would benefit from continued skilled therapy in order to reach goals and maximize functional L LE strength and ROM for prevention of further functional decline and surgical intervention.     Pt's surgical healing of bladder implant to effect aquatic exercise. Pt advised to start aquatic again for finalization of HEP once incision sites are healed.   OBJECTIVE IMPAIRMENTS decreased mobility, difficulty walking, decreased ROM, decreased strength, increased muscle spasms, improper body mechanics, postural dysfunction, and pain.    ACTIVITY LIMITATIONS lifting, sitting, standing, squatting, stairs, transfers, locomotion level   PARTICIPATION LIMITATIONS: cleaning, laundry, driving, shopping, community activity, occupation, yard work, and exercise   PERSONAL FACTORS Age, Fitness, Time since onset of injury/illness/exacerbation, and 3+ comorbidity:    are also affecting patient's functional outcome.    REHAB POTENTIAL: good   CLINICAL DECISION MAKING: stable/uncomplicated   EVALUATION COMPLEXITY: low     GOALS:     SHORT TERM GOALS: Target date:  09/12/2022      Pt will become independent with HEP in order to demonstrate synthesis of PT education..   Goal status: MET   2.  STG FOTO to be set when able.      Goal status: INITIAL   3.  Pt will be able to demonstrate/report ability to sit/stand/sleep for extended periods of time without pain in order to demonstrate functional improvement and tolerance to static positioning.    Goal status: INITIAL     LONG TERM GOALS: Target date: 10/24/2022      Pt  will become independent with final HEP in order to demonstrate synthesis of PT education.   Goal status: INITIAL   2.  FOTO LTG to be set when able    Goal status: INITIAL   3.  Pt will be able to demonstrate squat to at least parallel in order to demonstrate functional improvement in LE function for self-care and house hold duties.    Goal status: INITIAL   4.  Pt will be able to  demonstrate reciprocal stair management without pain in order to demonstrate functional improvement in LE function for self-care and house hold duties. .    Goal status: INITIAL       PLAN: PT FREQUENCY: 1x/week (likely every other)   PT DURATION: 12 weeks (likely DC in 8-10wks)   PLANNED INTERVENTIONS: Therapeutic exercises, Therapeutic activity, Neuromuscular re-education, Balance training, Gait training, Patient/Family education, Self Care, and Joint mobilization   PLAN FOR NEXT SESSION: continue with R quad and hip strength, aquatics for R knee strength and stability; pt is very high functioning. 3x pool visit to establish HEP and then 3x land visits for gym  Zebedee Iba PT, DPT 10/05/22 4:31 PM

## 2022-10-06 ENCOUNTER — Encounter: Payer: Self-pay | Admitting: Pulmonary Disease

## 2022-10-06 DIAGNOSIS — G4733 Obstructive sleep apnea (adult) (pediatric): Secondary | ICD-10-CM

## 2022-10-06 MED ORDER — LEVOTHYROXINE SODIUM 150 MCG PO TABS: 150.0000 ug | ORAL_TABLET | Freq: Every day | ORAL | 3 refills | Status: DC

## 2022-10-08 ENCOUNTER — Other Ambulatory Visit: Payer: Self-pay | Admitting: Family

## 2022-10-09 ENCOUNTER — Encounter: Payer: Self-pay | Admitting: Internal Medicine

## 2022-10-09 ENCOUNTER — Other Ambulatory Visit: Payer: Self-pay

## 2022-10-09 ENCOUNTER — Encounter: Payer: Self-pay | Admitting: Family

## 2022-10-09 MED ORDER — ALBUTEROL SULFATE (5 MG/ML) 0.5% IN NEBU: 2.5000 mg | INHALATION_SOLUTION | Freq: Four times a day (QID) | RESPIRATORY_TRACT | 3 refills | Status: DC | PRN

## 2022-10-09 MED ORDER — LEVOTHYROXINE SODIUM 150 MCG PO TABS: 150.0000 ug | ORAL_TABLET | Freq: Every day | ORAL | 3 refills | Status: DC

## 2022-10-09 NOTE — Telephone Encounter (Signed)
Dr.O, please see mychart messages sent by pt and advise. ?

## 2022-10-09 NOTE — Telephone Encounter (Signed)
Unfortunately, it is a process.  Not sure when medical supply company will be able to provide you with a new machine.  When the machine starts showing this warning- it does not mean it will stop working.  Please place order for auto CPAP 5-20

## 2022-10-11 ENCOUNTER — Telehealth: Payer: Self-pay | Admitting: Family

## 2022-10-11 NOTE — Telephone Encounter (Signed)
Pharmacy called stating the .5% nebulizer solution is not available and they need to change the script. They stated that they have the .25% is available in the pre-filled vial. Please advise.

## 2022-10-12 MED ORDER — ALBUTEROL SULFATE (2.5 MG/3ML) 0.083% IN NEBU: 2.5000 mg | INHALATION_SOLUTION | Freq: Four times a day (QID) | RESPIRATORY_TRACT | 5 refills | Status: DC | PRN

## 2022-10-12 NOTE — Telephone Encounter (Signed)
Rx sent for 0.25mg  dose.

## 2022-10-19 ENCOUNTER — Ambulatory Visit (HOSPITAL_BASED_OUTPATIENT_CLINIC_OR_DEPARTMENT_OTHER): Payer: Medicare Other | Admitting: Physical Therapy

## 2022-10-19 ENCOUNTER — Encounter (HOSPITAL_BASED_OUTPATIENT_CLINIC_OR_DEPARTMENT_OTHER): Payer: Self-pay | Admitting: Physical Therapy

## 2022-10-19 DIAGNOSIS — M6281 Muscle weakness (generalized): Secondary | ICD-10-CM

## 2022-10-19 DIAGNOSIS — M25561 Pain in right knee: Secondary | ICD-10-CM | POA: Diagnosis not present

## 2022-10-19 DIAGNOSIS — R262 Difficulty in walking, not elsewhere classified: Secondary | ICD-10-CM

## 2022-10-19 NOTE — Therapy (Signed)
OUTPATIENT PHYSICAL THERAPY LOWER EXTREMITY TREATMENT NOTE   Patient Name: Michelle Kennedy MRN: [id number] DOB:1955/08/14, 67 y.o., female Today's Date: 10/19/2022  END OF SESSION:  PT End of Session - 10/19/22 1535     Visit Number 4    Number of Visits 13    Date for PT Re-Evaluation 10/30/22    Authorization Type MCR    PT Start Time 1532    PT Stop Time 1600    PT Time Calculation (min) 28 min    Activity Tolerance Patient tolerated treatment well    Behavior During Therapy WFL for tasks assessed/performed              Past Medical History:  Diagnosis Date   Abnormally small mouth    Achilles tendon disorder, right    Allergy    Anemia    Anxiety    Arthritis    hips and knees   Asthma    daily inhaler, prn inhaler and neb.   Asthma due to environmental allergies    Basal cell carcinoma    nose, s/p mohs.   Breast cancer (HCC) 01/2014   left   Cataract    Chest pain    CHF (congestive heart failure) (HCC)    COPD (chronic obstructive pulmonary disease) (HCC)    Dental bridge present    upper front and lower right   Dental crowns present    x 3   Depression    Esophageal spasm    reports since her chemo and breast surgery  she developed esophageal spasms and reports this is in the past has casue her 02 to desat in the  70s , denies sycnope in relation to this , does report hx of vertigo as well    Family history of anesthesia complication    twin brother aspirated and died on OR table, per pt.   Fibromyalgia    Gallbladder disease    Gallstones    GERD (gastroesophageal reflux disease)    Glaucoma    H/O blood clots    had blood to  PICC line    History of gastric ulcer    as a teenager   History of seizure age 4   as a reaction to Penicillin - no seizures since   History of stomach ulcers    History of thyroid cancer    s/p thyroidectomy   HTN (hypertension)    Hyperlipidemia    Hypothyroidism    Joint pain    Left knee injury    Liver  disorder    seen when she had her hysterectomy , reports she was told it was  " small lesion" ; but asymptomatic    Lymphedema    left arm   Migraines    Multiple food allergies    Obesity    OSA (obstructive sleep apnea) 02/16/2016   CPAP   Palpitations    reports no longer experiences   Personal history of chemotherapy 2015   Personal history of radiation therapy 2015   Left   Prediabetes    Rheumatoid arthritis (HCC)    Sleep apnea    SOB (shortness of breath)    Swelling of both ankles    Tinnitus    UTI (lower urinary tract infection) 02/17/2014   Vertigo    last episode  was 2 weeks ago    Vitamin D deficiency    Wears contact lenses    left eye only    Wears hearing  aid in both ears    Past Surgical History:  Procedure Laterality Date   ABDOMINAL HYSTERECTOMY  2004   complete   ACHILLES TENDON REPAIR Right    APPENDECTOMY  2004   BREAST EXCISIONAL BIOPSY     BREAST LUMPECTOMY Left    2015   BREAST LUMPECTOMY WITH NEEDLE LOCALIZATION AND AXILLARY SENTINEL LYMPH NODE BX Left 02/23/2014   Procedure: BREAST LUMPECTOMY WITH NEEDLE LOCALIZATION AND AXILLARY SENTINEL LYMPH NODE BIOPSY;  Surgeon: Glenna Fellows, MD;  Location: [facility];  Service: General;  Laterality: Left;   BREAST SURGERY  2011   left breast biposy   CHOLECYSTECTOMY  1990   COLONOSCOPY W/ POLYPECTOMY  06/2009   EYE SURGERY Right 2013   exc. warts from underneath eyelid   INCONTINENCE SURGERY  2004   KNEE ARTHROSCOPY Bilateral    x 6 each knee   LIGAMENT REPAIR Right    thumb/wrist   ORIF TOE FRACTURE Right    great toe   PORTACATH PLACEMENT N/A 03/24/2014   Procedure: INSERTION PORT-A-CATH;  Surgeon: Glenna Fellows, MD;  Location: [facility];  Service: General;  Laterality: N/A;; removed    THYROIDECTOMY  2000   TONSILLECTOMY AND ADENOIDECTOMY  2000   TOTAL KNEE ARTHROPLASTY Left 12/03/2017   Procedure: LEFT TOTAL KNEE ARTHROPLASTY;  Surgeon: Ollen Gross, MD;  Location: [facility];  Service: Orthopedics;  Laterality: Left;   TUBAL LIGATION  1981   TUMOR EXCISION     from thoracic spine   Patient Active Problem List   Diagnosis Date Noted   SOB (shortness of breath) on exertion 10/03/2022   Bronchitis 05/08/2022   Polyphagia 04/27/2022   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   Abdominal pannus 03/02/2022   Class 3 severe obesity with serious comorbidity and body mass index (BMI) of 40.0 to 44.9 in adult [facility]) 03/02/2022   Vertigo 02/07/2022   Peroneal tendinitis of left lower extremity 02/07/2022   Visceral obesity 02/01/2022   Sacral pain 09/20/2021   Pain in right knee 09/13/2021   Chronic left-sided low back pain with left-sided sciatica 04/27/2021   Nondisplaced fracture of middle phalanx of left lesser toe(s), initial encounter for closed fracture 09/17/2020   Chronic diastolic CHF (congestive heart failure) (HCC) 08/23/2019   Retinal artery occlusion 08/22/2019   History of total left knee replacement 12/07/2017   OA (osteoarthritis) of knee 12/03/2017   Morbid obesity (HCC) 01/01/2017   Essential hypertension 10/18/2016   Constipation 10/04/2016   Vitamin D deficiency 10/04/2016   Prediabetes 10/04/2016   BMI 39.0-39.9,adult 09/20/2016   OSA (obstructive sleep apnea) 02/16/2016   Malignant neoplasm of thyroid gland (HCC) 10/11/2015   De Quervain's tenosynovitis, bilateral 09/15/2015   Trigger finger, acquired 06/22/2015   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/10/2015   Postmenopausal osteoporosis 12/08/2014   Preventative health care 12/01/2014   DVT (deep venous thrombosis) (HCC) 08/28/2014   Lymphedema of arm 08/28/2014   Mucositis due to chemotherapy 06/02/2014   Menopausal syndrome (hot flashes) 12/31/2013   Breast cancer of upper-outer quadrant of left female breast (HCC) 12/26/2013   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   Gingivitis 12/05/2013   Seasonal allergies 09/24/2013    Hyperlipidemia 06/02/2011   Migraine 06/02/2011   Tinnitus of both ears 02/14/2011   Fibromyalgia 05/10/2010   Asthma 03/22/2010   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   GERD 12/20/2009   Depression 12/01/2009    PCP: Sandford Craze, NP   REFERRING PROVIDER:   Jordan Likes,  Marye Round, MD    REFERRING DIAG:  Diagnosis  M17.11 (ICD-10-CM) - Primary osteoarthritis of right knee    THERAPY DIAG:  Pain in joint of right knee  Difficulty walking  Muscle weakness (generalized)  Rationale for Evaluation and Treatment: Rehabilitation  ONSET DATE: 8 months ago  SUBJECTIVE:   SUBJECTIVE STATEMENT: Pt states she had no pain after last session and was able to do some aquatic exercise without pain.   Pt started exercise again for silver sneakers at the gym. Pt states knee pain is mild. Pt will be walking a lot coming in June. Pt has not done the LE machines at all. Has only done UE machines.   PERTINENT HISTORY: Gel and Zilretta injections; L TKA; bladder stimulator 3/8; fibro, HTN PAIN:  Are you having pain? no: NPRS scale: 0/10 Pain location: medial inferior patella  Pain description: aching Aggravating factors: stairs, inclines, especially down Relieving factors: icing, elevating, resting PRECAUTIONS: cannot lay supine due to bladder implant  WEIGHT BEARING RESTRICTIONS: No  FALLS:  Has patient fallen in last 6 months? 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 No acute osseus changes s/p  left knee  replacement without any sign of loosening   COGNITION: Overall cognitive status: Within functional limits for tasks assessed     SENSATION: WFL  TODAY'S TREATMENT:                                                                                                                              DATE:   5/30   Nustep 5 min warm up lvl 5  Lateral step down Staggered stance squat to bench 15lbs 3x8 SL RDL cone tap on table 3x8 SLS with star reach 3x5  Edu about safe ROM with machines, machine set up, exercise tolerance  5/16  Nustep 5 min warm up lvl 5  White SL leg press 3x8 70lbs  SL knee extension 25lbs 3x8 SL hamstring curl 3x8 40lbs   SLS on airex 30s 3x 6" lateral step down 3x8  Edu about safe ROM with machines, machine set up, exercise tolerance, periodization parameters  08/25/22 Pt seen for aquatic therapy today.  Treatment took place in water 3.5-4.75 ft in depth at the [facility] pool. Temp of water was 91.  Pt entered/exited the pool via stairs independently in step-to pattern  with bilat rail. * without support:  walking forward/ backward/ side stepping / forward walking kicks * with yellow hand floats:  side step squat  L/R with arm  addct; walking forward/ backward with single / bilat under water at side * side step with hydro band on below knees * squats with yellow hand float x 15 * Rt forward and lateral step ups - L retro step down x 15 each, UE on rail * bilat heel raises x 10, then bilat calf stretch * R/L quad stretch x 30s x 2 each (standing on blue step, foot on bench behind her) * R/L hamstring stretch x 30s x 2 each Pt requires the buoyancy and hydrostatic pressure of water for support, and to offload joints by unweighting joint load by at least 50 % in navel deep water and by at least 75-80% in chest to neck deep water.  Viscosity of the water is needed for resistance of strengthening. Water current perturbations provides challenge to standing  balance requiring increased core activation.     PATIENT EDUCATION:  Education details: anatomy, exercise progression, DOMS expectations, HEP, POC Person educated: Patient Education method: Explanation, Demonstration, Tactile cues, Verbal cues, and Handouts Education comprehension: verbalized understanding, returned demonstration, verbal cues required, and tactile cues required   HOME EXERCISE PROGRAM:  Access Code: [facility] URL: [email or web address] Date: 08/01/2022 Prepared by: Zebedee Iba   ASSESSMENT:   CLINICAL IMPRESSION:  Pt able to progress SL activity during session within pain tolerance. Pt currently with well rounded land program and edu provided on progression of exercise for R LE strengthening. Pt to finish up therapy with aquatic sessions in order to establish aquatic therapy exercise for R and L LE strength for prevention/prolonging of TKA. Full land HEP provided.   Pt would benefit from continued skilled therapy in order to reach goals and maximize functional LE strength and ROM for prevention of further functional decline and surgical intervention.      OBJECTIVE IMPAIRMENTS decreased mobility, difficulty walking, decreased ROM, decreased strength, increased muscle spasms, improper body mechanics, postural dysfunction, and pain.    ACTIVITY LIMITATIONS lifting, sitting, standing, squatting, stairs, transfers, locomotion level   PARTICIPATION LIMITATIONS: cleaning, laundry, driving, shopping, community activity, occupation, yard work, and exercise   PERSONAL FACTORS Age, Fitness, Time since onset of injury/illness/exacerbation, and 3+ comorbidity:    are also affecting patient's functional outcome.    REHAB POTENTIAL: good   CLINICAL DECISION MAKING: stable/uncomplicated   EVALUATION COMPLEXITY: low     GOALS:     SHORT TERM GOALS: Target date: 09/12/2022      Pt will become independent with HEP in order to demonstrate synthesis of PT  education..   Goal status: MET   2.  STG FOTO to be set when able.      Goal status: INITIAL   3.  Pt will be able to demonstrate/report ability to sit/stand/sleep for extended periods of time without pain in order to demonstrate functional improvement and tolerance to static positioning.    Goal status: met     LONG TERM GOALS: Target date: 10/24/2022      Pt  will become independent with final HEP in order to demonstrate synthesis of PT education.   Goal status: INITIAL   2.  FOTO LTG to be set when able    Goal status: INITIAL   3.  Pt will be able to demonstrate squat to at least parallel in order to demonstrate functional improvement in LE function for self-care and house hold duties.    Goal status: INITIAL   4.  Pt will be able to demonstrate reciprocal stair management without pain in order to demonstrate functional  improvement in LE function for self-care and house hold duties. .    Goal status: INITIAL       PLAN: PT FREQUENCY: 1x/week (likely every other)   PT DURATION: 12 weeks (likely DC in 8-10wks)   PLANNED INTERVENTIONS: Therapeutic exercises, Therapeutic activity, Neuromuscular re-education, Balance training, Gait training, Patient/Family education, Self Care, and Joint mobilization   PLAN FOR NEXT SESSION: continue with R quad and hip strength, aquatics for R knee strength and stability; pt is very high functioning. 3x pool visit to establish HEP and then 3x land visits for gym  Zebedee Iba PT, DPT 10/19/22 4:09 PM

## 2022-10-28 ENCOUNTER — Other Ambulatory Visit: Payer: Self-pay | Admitting: Bariatrics

## 2022-10-28 DIAGNOSIS — R7303 Prediabetes: Secondary | ICD-10-CM

## 2022-10-29 ENCOUNTER — Other Ambulatory Visit: Payer: Self-pay | Admitting: Hematology and Oncology

## 2022-10-30 MED ORDER — SEMAGLUTIDE (2 MG/DOSE) 8 MG/3ML ~~LOC~~ SOPN
2.0000 mg | PEN_INJECTOR | SUBCUTANEOUS | 0 refills | Status: DC
Start: 2022-10-30 — End: 2022-11-15

## 2022-10-31 ENCOUNTER — Encounter (HOSPITAL_BASED_OUTPATIENT_CLINIC_OR_DEPARTMENT_OTHER): Payer: Medicare Other | Admitting: Physical Therapy

## 2022-11-01 ENCOUNTER — Ambulatory Visit: Payer: Medicare Other | Admitting: Bariatrics

## 2022-11-01 ENCOUNTER — Other Ambulatory Visit: Payer: Self-pay | Admitting: Internal Medicine

## 2022-11-01 DIAGNOSIS — R7303 Prediabetes: Secondary | ICD-10-CM

## 2022-11-15 ENCOUNTER — Ambulatory Visit (INDEPENDENT_AMBULATORY_CARE_PROVIDER_SITE_OTHER): Payer: Medicare Other | Admitting: Bariatrics

## 2022-11-15 ENCOUNTER — Encounter: Payer: Self-pay | Admitting: Bariatrics

## 2022-11-15 VITALS — BP 135/77 | HR 63 | Temp 97.6°F | Ht 66.0 in | Wt 252.0 lb

## 2022-11-15 DIAGNOSIS — Z6841 Body Mass Index (BMI) 40.0 and over, adult: Secondary | ICD-10-CM | POA: Diagnosis not present

## 2022-11-15 DIAGNOSIS — R5383 Other fatigue: Secondary | ICD-10-CM | POA: Diagnosis not present

## 2022-11-15 DIAGNOSIS — R7303 Prediabetes: Secondary | ICD-10-CM | POA: Diagnosis not present

## 2022-11-15 MED ORDER — ONDANSETRON HCL 4 MG PO TABS
4.0000 mg | ORAL_TABLET | Freq: Three times a day (TID) | ORAL | 0 refills | Status: DC | PRN
Start: 2022-11-15 — End: 2023-09-28

## 2022-11-15 MED ORDER — SEMAGLUTIDE (2 MG/DOSE) 8 MG/3ML ~~LOC~~ SOPN
2.0000 mg | PEN_INJECTOR | SUBCUTANEOUS | 0 refills | Status: DC
Start: 2022-11-15 — End: 2022-12-12

## 2022-11-15 NOTE — Progress Notes (Signed)
WEIGHT SUMMARY AND BIOMETRICS  Weight Gained Since Last Visit: 6lb   Vitals Temp: 97.6 F (36.4 C) BP: 135/77 Pulse Rate: 63 SpO2: 97 %   Anthropometric Measurements Height: 5\' 6"  (1.676 m) Weight: 252 lb (114.3 kg) BMI (Calculated): 40.69 Weight at Last Visit: 246lb Weight Gained Since Last Visit: 6lb Starting Weight: 275lb Total Weight Loss (lbs): 23 lb (10.4 kg)   Body Composition  Body Fat %: 51.6 % Fat Mass (lbs): 130.4 lbs Muscle Mass (lbs): 116 lbs Visceral Fat Rating : 18   Other Clinical Data Fasting: no Labs: no Today's Visit #: 19 Starting Date: 07/28/21    OBESITY Michelle Kennedy is here to discuss her progress with her obesity treatment plan along with follow-up of her obesity related diagnoses.     Nutrition Plan: keeping a food journal with goal of 1600 calories and 90-100 grams of protein daily - 0% adherence.  Current exercise: walking  Interim History:  She has gained 6 lbs since her last visit.  Protein intake is as prescribed, Is not skipping meals, Not journaling consistently., Water intake is inadequate., Denies polyphagia, and Denies excessive cravings.  Pharmacotherapy: Michelle Kennedy is on Ozempic 2 mg SQ weekly Adverse side effects: None Hunger is moderately controlled.  Cravings are moderately controlled.  Assessment/Plan:   1. Prediabetes Prediabetes Last A1c was 5.7  Medication(s): Ozempic 2 mg Ozempic 2 mg SQ weekly Lab Results  Component Value Date   HGBA1C 5.7 (A) 10/03/2022   HGBA1C 5.7 (H) 07/06/2022   HGBA1C 5.7 (A) 02/02/2022   HGBA1C 5.6 10/28/2021   HGBA1C 5.8 (H) 07/28/2021   Lab Results  Component Value Date   INSULIN 18.0 07/06/2022   INSULIN 25.6 (H) 07/28/2021   INSULIN 21.8 09/20/2016    Plan: Information sheet on " Insulin Resistance and Prediabetes".  Will minimize all refined carbohydrates both sweets and starches.  Will work on the plan and exercise.  Consider both aerobic and resistance  training.  Will keep protein, water, and fiber intake high.  Increase Polyunsaturated and Monounsaturated fats to increase satiety and encourage weight loss.  Aim for 7 to 9 hours of sleep nightly.  Will continue medications.   Fatigue and SOB  Patient states that she is fatigued with certain activities.   Resting Metabolic Rate ( RMR). RMR ( actual): 1886 cals  RMR ( previous): 2390 cals 03/23  Plan:  A plan adjustment was made based on current RMR reading.  Read and interpreted the RMR readings to the patient. Patient voiced understanding.  Discussed the implications for the chosen plan and exercise based on the RMR reading.  Will consider repeating the RMR in the future based on weight loss.     Morbid Obesity: Current BMI BMI (Calculated): 40.69   Pharmacotherapy Plan Continue and refill  Ozempic 2 mg SQ weekly  Michelle Kennedy is currently in the action stage of change. As such, her goal is to continue with weight loss efforts.  She has agreed to keeping a food journal with goal of 1,300 calories and 90 grams of protein daily. This is based on her last indirect calorimetry test.    Exercise goals: For substantial health benefits, adults should do at least 150 minutes (2 hours and 30 minutes) a week of moderate-intensity, or 75 minutes (1 hour and 15 minutes) a week of vigorous-intensity aerobic physical activity, or an equivalent combination of moderate- and vigorous-intensity aerobic activity. Aerobic activity should be performed in episodes of at least 10 minutes, and preferably, it  should be spread throughout the week.  Behavioral modification strategies: increasing lean protein intake, decreasing simple carbohydrates , planning for success, increasing vegetables, and keep healthy foods in the home.She will resume journaling using "My Fitness" app.   Michelle Kennedy has agreed to follow-up with our clinic in 4 weeks.       Objective:   VITALS: Per patient if applicable, see  vitals. GENERAL: Alert and in no acute distress. CARDIOPULMONARY: No increased WOB. Speaking in clear sentences.  PSYCH: Pleasant and cooperative. Speech normal rate and rhythm. Affect is appropriate. Insight and judgement are appropriate. Attention is focused, linear, and appropriate.  NEURO: Oriented as arrived to appointment on time with no prompting.   Attestation Statements:    This was prepared with the assistance of Engineer, civil (consulting).  Occasional wrong-word or sound-a-like substitutions may have occurred due to the inherent limitations of voice recognition software.   Michelle Capra, DO

## 2022-11-21 ENCOUNTER — Other Ambulatory Visit: Payer: Self-pay

## 2022-11-21 ENCOUNTER — Ambulatory Visit (INDEPENDENT_AMBULATORY_CARE_PROVIDER_SITE_OTHER): Payer: Medicare Other | Admitting: Family Medicine

## 2022-11-21 ENCOUNTER — Telehealth: Payer: Self-pay

## 2022-11-21 VITALS — BP 154/92 | HR 80 | Ht 66.0 in | Wt 254.0 lb

## 2022-11-21 DIAGNOSIS — G8929 Other chronic pain: Secondary | ICD-10-CM

## 2022-11-21 DIAGNOSIS — M25561 Pain in right knee: Secondary | ICD-10-CM | POA: Diagnosis not present

## 2022-11-21 DIAGNOSIS — M25572 Pain in left ankle and joints of left foot: Secondary | ICD-10-CM

## 2022-11-21 DIAGNOSIS — M1711 Unilateral primary osteoarthritis, right knee: Secondary | ICD-10-CM

## 2022-11-21 MED ORDER — TRIAMCINOLONE ACETONIDE 32 MG IX SRER
32.0000 mg | Freq: Once | INTRA_ARTICULAR | Status: AC
Start: 2022-11-21 — End: 2022-11-21
  Administered 2022-11-21: 32 mg via INTRA_ARTICULAR

## 2022-11-21 NOTE — Progress Notes (Signed)
Rubin Payor, PhD, LAT, ATC acting as a scribe for [name], MD.  Michelle Kennedy is a 67 y.o. female who presents to [facility] today for R knee pain. Pt was previously seen by Dr. Jordan Likes on 06/27/22 and her R knee was aspirated and injected w/ Zilretta. L TKR 3-4 years ago.   Today, pt reports prior Zilretta injection last until about 1 month ago and progressively worsened. The issue w/ her knee effects her balance.   R Knee swelling: yes- esp along the lateral aspect Mechanical symptoms: yes Aggravates: prolong standing, stairs, lateral movements Treatments tried: knee brace, steroid injections, cane, PT, silver sneakers x2wk, Tylenol, weight loss  She had some left ankle pain thought to be tendinitis previously by Dr. Jordan Likes.  She currently is receiving physical therapy for her back and knee.  PT has not yet addressed her shoulder.  Dx imaging: 07/28/21 R knee XR  Pertinent review of systems: No fevers or chills   Relevant historical information: Heart failure History of thyroid cancer.  Exam:  BP (!) 154/92   Pulse 80   Ht 5\' 6"  (1.676 m)   Wt 254 lb (115.2 kg)   SpO2 97%   BMI 41.00 kg/m  General: Well Developed, well nourished, and in no acute distress.   MSK: Right knee large joint effusion.  Normal motion.  Tender palpation medial lateral joint line.    Lab and Radiology Results  Zilretta injection right knee Procedure: Real-time Ultrasound Guided aspiration and injection right knee joint superior lateral patella space Device: Philips Affiniti 50G/GE Logiq Images permanently stored and available for review in PACS Verbal informed consent obtained.  Discussed risks and benefits of procedure. Warned about infection, bleeding, hyperglycemia damage to structures among others. Patient expresses understanding and agreement Time-out conducted.   Noted no overlying erythema, induration, or other signs of local infection.   Skin  prepped in a sterile fashion.   Local anesthesia: Topical Ethyl chloride.   With sterile technique and under real time ultrasound guidance: 2 mL of lidocaine injected achieving good anesthesia. Skin was again sterilized with isopropyl alcohol and 18-gauge needle was used to access the knee joint. 30 mL of clear yellow fluid was aspirated. Syringe was exchanged and 32 mg of Zilretta was injected to the knee joint. Completed without difficulty   Advised to call if fevers/chills, erythema, induration, drainage, or persistent bleeding.   Images permanently stored and available for review in the ultrasound unit.  Impression: Technically successful ultrasound guided injection. Lot number: [id number]    Right knee x-ray obtained July 28, 2021 shows moderate medial compartment DJD.  Images personally independently reviewed November 21, 2022  Assessment and Plan: 67 y.o. female with right knee pain due to exacerbation of DJD.  Fundamentally she is going to need a knee replacement at some point.  She is done okay with Zilretta injection about 5 months ago.  Plan for repeat aspiration and Zilretta injection today. She is working on weight loss which will be helpful.  She is also proceeding with physical therapy for the knee which will be helpful.  The left ankle could also benefit from PT.  Will ask PT to address the ankle.  Can repeat Zilretta injection in 3 months which would be early October.   PDMP not reviewed this encounter. Orders Placed This Encounter  Procedures   [place] LIMITED JOINT SPACE STRUCTURES LOW RIGHT(NO LINKED CHARGES)    Order Specific Question:   Reason  for Exam (SYMPTOM  OR DIAGNOSIS REQUIRED)    Answer:   right knee pain    Order Specific Question:   Preferred imaging location?    Answer:   [facility] referral to Physical Therapy    Referral Priority:   Routine    Referral Type:   Physical Medicine    Referral Reason:   Specialty Services  Required    Requested Specialty:   Physical Therapy    Number of Visits Requested:   1   Meds ordered this encounter  Medications   Triamcinolone Acetonide (ZILRETTA) intra-articular injection 32 mg     Discussed warning signs or symptoms. Please see discharge instructions. Patient expresses understanding.   The above documentation has been reviewed and is accurate and complete [name], M.D.

## 2022-11-21 NOTE — Telephone Encounter (Signed)
VOB initiated for Zilretta for RIGHT knee OA.  

## 2022-11-21 NOTE — Patient Instructions (Addendum)
Thank you for coming in today.   I will ask PT to work on your left ankle   We can do the same injection again early October.  Please send a mychart message prior to that if you need it.

## 2022-11-22 ENCOUNTER — Ambulatory Visit (HOSPITAL_BASED_OUTPATIENT_CLINIC_OR_DEPARTMENT_OTHER): Payer: Medicare Other | Attending: Family Medicine | Admitting: Physical Therapy

## 2022-11-22 ENCOUNTER — Encounter (HOSPITAL_BASED_OUTPATIENT_CLINIC_OR_DEPARTMENT_OTHER): Payer: Self-pay | Admitting: Physical Therapy

## 2022-11-22 DIAGNOSIS — M25561 Pain in right knee: Secondary | ICD-10-CM

## 2022-11-22 DIAGNOSIS — R262 Difficulty in walking, not elsewhere classified: Secondary | ICD-10-CM | POA: Diagnosis present

## 2022-11-22 DIAGNOSIS — M5459 Other low back pain: Secondary | ICD-10-CM | POA: Diagnosis present

## 2022-11-22 DIAGNOSIS — G8929 Other chronic pain: Secondary | ICD-10-CM | POA: Diagnosis present

## 2022-11-22 DIAGNOSIS — M25672 Stiffness of left ankle, not elsewhere classified: Secondary | ICD-10-CM | POA: Diagnosis present

## 2022-11-22 DIAGNOSIS — M6281 Muscle weakness (generalized): Secondary | ICD-10-CM | POA: Diagnosis present

## 2022-11-22 DIAGNOSIS — R252 Cramp and spasm: Secondary | ICD-10-CM | POA: Diagnosis present

## 2022-11-22 DIAGNOSIS — R293 Abnormal posture: Secondary | ICD-10-CM

## 2022-11-22 DIAGNOSIS — M25572 Pain in left ankle and joints of left foot: Secondary | ICD-10-CM | POA: Insufficient documentation

## 2022-11-22 NOTE — Telephone Encounter (Signed)
Zilretta for RIGHT knee OA  Primary Insurance: Medicare Co-pay: n/a Co-insurance: 20% Deductible: $240 of $240 met Prior Auth NOT required  Secondary Insurance: [employer] Co-pay: n/a Co-insurance: covers 20% Medicare co-insurance Deductible: has been met Prior Auth NOT required

## 2022-11-22 NOTE — Telephone Encounter (Signed)
Pt received Zilretta inj for RIGHT knee OA on 11/21/22. Can consider repeat inj on or after 02/14/23.
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
## 2022-11-24 ENCOUNTER — Other Ambulatory Visit: Payer: Self-pay | Admitting: Family

## 2022-11-24 ENCOUNTER — Encounter: Payer: Self-pay | Admitting: Family

## 2022-11-24 MED ORDER — MONTELUKAST SODIUM 10 MG PO TABS: 10.0000 mg | ORAL_TABLET | Freq: Every day | ORAL | 1 refills | Status: DC

## 2022-11-28 ENCOUNTER — Encounter (HOSPITAL_BASED_OUTPATIENT_CLINIC_OR_DEPARTMENT_OTHER): Payer: Self-pay | Admitting: Physical Therapy

## 2022-11-28 ENCOUNTER — Ambulatory Visit (HOSPITAL_BASED_OUTPATIENT_CLINIC_OR_DEPARTMENT_OTHER): Payer: Medicare Other | Admitting: Physical Therapy

## 2022-11-28 DIAGNOSIS — M25561 Pain in right knee: Secondary | ICD-10-CM | POA: Diagnosis not present

## 2022-11-28 DIAGNOSIS — M6281 Muscle weakness (generalized): Secondary | ICD-10-CM

## 2022-11-28 DIAGNOSIS — M25572 Pain in left ankle and joints of left foot: Secondary | ICD-10-CM

## 2022-11-28 DIAGNOSIS — M25672 Stiffness of left ankle, not elsewhere classified: Secondary | ICD-10-CM

## 2022-11-28 DIAGNOSIS — R262 Difficulty in walking, not elsewhere classified: Secondary | ICD-10-CM

## 2022-11-28 NOTE — Therapy (Signed)
OUTPATIENT PHYSICAL THERAPY LOWER EXTREMITY EVALUATION   Patient Name: Michelle Kennedy MRN: [id number] DOB:[birth year], 67 y.o., female Today's Date: 11/28/2022  END OF SESSION:    Past Medical History:  Diagnosis Date   Abnormally small mouth    Achilles tendon disorder, right    Allergy    Anemia    Anxiety    Arthritis    hips and knees   Asthma    daily inhaler, prn inhaler and neb.   Asthma due to environmental allergies    Basal cell carcinoma    nose, s/p mohs.   Breast cancer (HCC) 01/2014   left   Cataract    Chest pain    CHF (congestive heart failure) (HCC)    COPD (chronic obstructive pulmonary disease) (HCC)    Dental bridge present    upper front and lower right   Dental crowns present    x 3   Depression    Esophageal spasm    reports since her chemo and breast surgery  she developed esophageal spasms and reports this is in the past has casue her 02 to desat in the  70s , denies sycnope in relation to this , does report hx of vertigo as well    Family history of anesthesia complication    twin brother aspirated and died on OR table, per pt.   Fibromyalgia    Gallbladder disease    Gallstones    GERD (gastroesophageal reflux disease)    Glaucoma    H/O blood clots    had blood to  PICC line    History of gastric ulcer    as a teenager   History of seizure age 14   as a reaction to Penicillin - no seizures since   History of stomach ulcers    History of thyroid cancer    s/p thyroidectomy   HTN (hypertension)    Hyperlipidemia    Hypothyroidism    Joint pain    Left knee injury    Liver disorder    seen when she had her hysterectomy , reports she was told it was  " small lesion" ; but asymptomatic    Lymphedema    left arm   Migraines    Multiple food allergies    Obesity    OSA (obstructive sleep apnea) 02/16/2016   CPAP   Palpitations    reports no longer experiences   Personal history of chemotherapy 2015   Personal history of  radiation therapy 2015   Left   Prediabetes    Rheumatoid arthritis (HCC)    Sleep apnea    SOB (shortness of breath)    Swelling of both ankles    Tinnitus    UTI (lower urinary tract infection) 02/17/2014   Vertigo    last episode  was 2 weeks ago    Vitamin D deficiency    Wears contact lenses    left eye only    Wears hearing aid in both ears    Past Surgical History:  Procedure Laterality Date   ABDOMINAL HYSTERECTOMY  2004   complete   ACHILLES TENDON REPAIR Right    APPENDECTOMY  2004   BREAST EXCISIONAL BIOPSY     BREAST LUMPECTOMY Left    2015   BREAST LUMPECTOMY WITH NEEDLE LOCALIZATION AND AXILLARY SENTINEL LYMPH NODE BX Left 02/23/2014   Procedure: BREAST LUMPECTOMY WITH NEEDLE LOCALIZATION AND AXILLARY SENTINEL LYMPH NODE BIOPSY;  Surgeon: Glenna Fellows, MD;  Location: [facility];  Service: General;  Laterality: Left;   BREAST SURGERY  2011   left breast biposy   CHOLECYSTECTOMY  1990   COLONOSCOPY W/ POLYPECTOMY  06/2009   EYE SURGERY Right 2013   exc. warts from underneath eyelid   INCONTINENCE SURGERY  2004   KNEE ARTHROSCOPY Bilateral    x 6 each knee   LIGAMENT REPAIR Right    thumb/wrist   ORIF TOE FRACTURE Right    great toe   PORTACATH PLACEMENT N/A 03/24/2014   Procedure: INSERTION PORT-A-CATH;  Surgeon: Glenna Fellows, MD;  Location: [facility];  Service: General;  Laterality: N/A;; removed    THYROIDECTOMY  2000   TONSILLECTOMY AND ADENOIDECTOMY  2000   TOTAL KNEE ARTHROPLASTY Left 12/03/2017   Procedure: LEFT TOTAL KNEE ARTHROPLASTY;  Surgeon: Ollen Gross, MD;  Location: [facility];  Service: Orthopedics;  Laterality: Left;   TUBAL LIGATION  1981   TUMOR EXCISION     from thoracic spine   Patient Active Problem List   Diagnosis Date Noted   SOB (shortness of breath) on exertion 10/03/2022   Bronchitis 05/08/2022   Polyphagia 04/27/2022   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   Abdominal pannus 03/02/2022    Class 3 severe obesity with serious comorbidity and body mass index (BMI) of 40.0 to 44.9 in adult [facility]) 03/02/2022   Vertigo 02/07/2022   Peroneal tendinitis of left lower extremity 02/07/2022   Visceral obesity 02/01/2022   Sacral pain 09/20/2021   Pain in right knee 09/13/2021   Chronic left-sided low back pain with left-sided sciatica 04/27/2021   Nondisplaced fracture of middle phalanx of left lesser toe(s), initial encounter for closed fracture 09/17/2020   Chronic diastolic CHF (congestive heart failure) (HCC) 08/23/2019   Retinal artery occlusion 08/22/2019   History of total left knee replacement 12/07/2017   OA (osteoarthritis) of knee 12/03/2017   Morbid obesity (HCC) 01/01/2017   Essential hypertension 10/18/2016   Constipation 10/04/2016   Vitamin D deficiency 10/04/2016   Prediabetes 10/04/2016   BMI 39.0-39.9,adult 09/20/2016   OSA (obstructive sleep apnea) 02/16/2016   Malignant neoplasm of thyroid gland (HCC) 10/11/2015   De Quervain's tenosynovitis, bilateral 09/15/2015   Trigger finger, acquired 06/22/2015   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/10/2015   Postmenopausal osteoporosis 12/08/2014   Preventative health care 12/01/2014   DVT (deep venous thrombosis) (HCC) 08/28/2014   Lymphedema of arm 08/28/2014   Mucositis due to chemotherapy 06/02/2014   Menopausal syndrome (hot flashes) 12/31/2013   Breast cancer of upper-outer quadrant of left female breast (HCC) 12/26/2013   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  Rationale for Evaluation and Treatment: Rehabilitation  ONSET DATE: 8 months ago  SUBJECTIVE:   SUBJECTIVE STATEMENT: Patient comes in today with a script for her left ankle. She has had pain for some time now. About 15 years ago she twisted her ankle. She had mild problems but now it is becoming more painful as her knee becomes more  of a problem. She feels the pain in her lateral malleolus and into her foot. She feels like her ankle is rolling when she is in flip-flops.   Initial Knee Eval:  Pt reports she has had 2 injections for the knee but they wear off. She reports the L knee pain started getting worse about 8 months ago. Pt has history of fibromyalgia and OA. Pt does do Silver Sneakers- sidestepping, weight lifting, squatting, chair exercise. Walks 30 mins a day- does wear a brace on the R knee. Has access to a pool in Summer. Member at [employer]. Pt is trying to delay the R TKA as much possible. Can walk about 30-35 mins before aching. Will bring a cane for long distance. No specific MOI. Has treadmill, upright bike, hand weights and kettle bells that go up to 15lbs. Pt denies cancer red flags. Pt denies NT.   PERTINENT HISTORY: Gel and Zilretta injections; L TKA; bladder stimulator 3/8; fibro, HTN;  PAIN:  Are you having pain? Yes: NPRS scale: 3/10 Pain location: medial inferior patella  Pain description: aching Aggravating factors: stairs, inclines, especially down Relieving factors: icing, elevating, resting  PRECAUTIONS: cannot lay supine due to bladder implant  WEIGHT BEARING RESTRICTIONS: No  FALLS:  Has patient fallen in last 6 months? 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 No acute osseus changes s/p  left knee replacement without any sign of loosening   PATIENT SURVEYS:   FOTO unable due to late arrival  COGNITION: Overall cognitive status: Within functional limits for tasks assessed     SENSATION: WFL   MUSCLE LENGTH: Hamstrings: unable to lay supine due to bladder surgery Positive Ely's on R  POSTURE: rounded shoulders and increased thoracic kyphosis  PALPATION: TTP of R knee joint line, inferio-medial patellar pole, patellar tendon; hypertonicity of L quad and HS  7/9 Large trigger point in medial calf   LOWER EXTREMITY ROM: L knee ROM lacking likely due to history of TKA  Active ROM *Right* eval Left eval  Hip flexion 110 110  Hip extension 0 0  Hip abduction    Hip adduction    Hip internal rotation    Hip external rotation    Knee flexion 125 120  Knee extension 0 -2  Ankle dorsiflexion 15 0  Ankle plantarflexion    Ankle inversion    Ankle eversion     (Blank rows = not tested)  LOWER EXTREMITY MMT:  MMT Right eval Left eval  Hip flexion    Hip extension    Hip abduction 37.7 43.8  Hip adduction    Hip internal rotation    Hip external rotation    Knee flexion 37.1 34.1  Knee extension 55.3 61.8  Ankle dorsiflexion    Ankle plantarflexion    Ankle inversion    Ankle eversion     (Blank rows = not tested)  LOWER EXTREMITY SPECIAL TESTS:  Knee special tests: Anterior drawer test: negative, Thessaly test: positive , and Step up/down test: positive   FUNCTIONAL TESTS:  5 times  sit to stand: 8.7s   GAIT: Distance walked: 44ft Assistive device utilized: None Level of assistance: Complete Independence Comments: mild toe out, decreased TKE on R  Steps: able to ascend  and descend reciprocally but requires UE with descent and pain with L LE lowering/ R eccentric   TODAY'S TREATMENT:                                                                                                                              DATE: 08/01/22  7/9 Perform reassessment on patient ankle     PATIENT EDUCATION:  Education details: MOI, diagnosis, prognosis, anatomy, exercise progression, DOMS expectations, muscle firing,  envelope of function, HEP, POC   Person educated: Patient Education method: Explanation, Demonstration, Tactile cues, Verbal cues, and Handouts Education comprehension: verbalized understanding, returned demonstration, verbal cues required, and tactile cues required   HOME EXERCISE PROGRAM:  Access Code: [facility] URL: [email or web address] Date: 08/01/2022 Prepared by: Zebedee Iba   Aquatic HEP  [phone]   ASSESSMENT:   CLINICAL IMPRESSION: Ankle eval: Patient presents with long history of left lateral ankle pain.  She reports a history of a severe sprain 15 years prior.  She presents with decreased dorsiflexion, eversion strength, plantarflexion flexion strength, and single-leg stability on the left side.  She needs a knee replacement on the right side.  She had a recent knee replacement on the left side.  She is active in the gym.  She has recently completed episode of knee therapy in the pool and on land.  She was given focused ankle stability exercises today as well as a series of standing exercises.  Patient will benefit from skilled therapy to improve her ability to stand and ambulate without increased left ankle pain.  See below for new ankle goals.   Knee eval: Patient is a 67 y.o. female who was seen today for physical therapy evaluation and treatment for c/c of chronic R  knee pain. Pt's s/s appear consistent with history of R knee OA in addition to joint stiffness restrictions and weakness causing pain. Pt's pain is minimally  sensitive and irritable with movement. Plan to continue with R knee flexion/extension strength and R hip motor control at future sessions. Pt is very high functioning and already exercising regularly to loose weight and stay active. Pt will need infrequent visits to establish aquatic and gym based HEP. Pt would benefit from continued skilled therapy in order to reach goals and maximize functional L LE strength and ROM for prevention of further functional decline and surgical intervention.     Pt's upcoming bladder implant surgery and surgical healing likely to impact POC and scheduling of visits.  OBJECTIVE IMPAIRMENTS decreased mobility, difficulty walking, decreased ROM, decreased strength, increased muscle spasms, improper body mechanics, postural dysfunction, and pain.    ACTIVITY LIMITATIONS lifting, sitting, standing, squatting, stairs, transfers, locomotion level   PARTICIPATION LIMITATIONS: cleaning, laundry, driving, shopping, community activity, occupation, yard work, and exercise  PERSONAL FACTORS Age, Fitness, Time since onset of injury/illness/exacerbation, and 3+ comorbidity:    are also affecting patient's functional outcome.    REHAB POTENTIAL: good   CLINICAL DECISION MAKING: stable/uncomplicated   EVALUATION COMPLEXITY: low     GOALS:     SHORT TERM GOALS: Target date: 09/12/2022      Pt will become independent with HEP in order to demonstrate synthesis of PT education..   Goal status: INITIAL   2.  STG FOTO to be set when able.      Goal status: INITIAL   3.  Pt will be able to demonstrate/report ability to sit/stand/sleep for extended periods of time without pain in order to demonstrate functional improvement and tolerance to static positioning.    Goal status: INITIAL  4.  Patient will increase active ankle dorsiflexion to 7 degrees. Goal status: Initial  5.  Patient will demonstrate 20 seconds of single-leg stance time without increased pain Goal  status: Initial     LONG TERM GOALS: Target date: 10/24/2022      Pt  will become independent with final HEP in order to demonstrate synthesis of PT education.   Goal status: INITIAL   2.  FOTO LTG to be set when able    Goal status: INITIAL   3.  Pt will be able to demonstrate squat to at least parallel in order to demonstrate functional improvement in LE function for self-care and house hold duties.    Goal status: INITIAL   4.  Pt will be able to demonstrate reciprocal stair management without pain in order to demonstrate functional improvement in LE function for self-care and house hold duties. .    Goal status: INITIAL   5.  Patient will ambulate community distances without increased left ankle pain. Goal status: Initial  6.  Patient will have complete ankle program for gym at home. Goal status: Initial       PLAN: PT FREQUENCY: 1x/week (likely every other)   PT DURATION: 12 weeks (likely DC in 8-10wks)   PLANNED INTERVENTIONS: Therapeutic exercises, Therapeutic activity, Neuromuscular re-education, Balance training, Gait training, Patient/Family education, Self Care, and Joint mobilization   PLAN FOR NEXT SESSION: continue with R quad and hip strength, aquatics for R knee strength and stability; pt is very high functioning. 3x pool visit to establish HEP and then 3x land visits for gym  Ankle: Consider progressing to Airex exercises.  Reviewed technique with heel raise.  Patient has pain when trying to perform heel raises at Silver sne[employer] class.  See if we can modify it to cause less pain.  If patient does well there ex shoulder her where to purchase the air-ex.   Dessie Coma, PT 11/28/2022, 12:55 PM

## 2022-12-06 ENCOUNTER — Ambulatory Visit (HOSPITAL_BASED_OUTPATIENT_CLINIC_OR_DEPARTMENT_OTHER): Payer: Medicare Other | Admitting: Physical Therapy

## 2022-12-06 ENCOUNTER — Encounter (HOSPITAL_BASED_OUTPATIENT_CLINIC_OR_DEPARTMENT_OTHER): Payer: Self-pay | Admitting: Physical Therapy

## 2022-12-06 DIAGNOSIS — M6281 Muscle weakness (generalized): Secondary | ICD-10-CM

## 2022-12-06 DIAGNOSIS — M25561 Pain in right knee: Secondary | ICD-10-CM

## 2022-12-06 DIAGNOSIS — M25672 Stiffness of left ankle, not elsewhere classified: Secondary | ICD-10-CM

## 2022-12-06 DIAGNOSIS — R262 Difficulty in walking, not elsewhere classified: Secondary | ICD-10-CM

## 2022-12-06 DIAGNOSIS — M25572 Pain in left ankle and joints of left foot: Secondary | ICD-10-CM

## 2022-12-06 NOTE — Therapy (Signed)
OUTPATIENT PHYSICAL THERAPY LOWER EXTREMITY EVALUATION   Patient Name: Michelle Kennedy MRN: [id number] DOB:[birth year], 67 y.o., female Today's Date: 12/06/2022  END OF SESSION:  PT End of Session - 12/06/22 1252     Visit Number 2    Number of Visits 6    Date for PT Re-Evaluation 01/09/23    Authorization Type MCR    PT Start Time 1145    PT Stop Time 1215    PT Time Calculation (min) 30 min    Activity Tolerance Patient tolerated treatment well    Behavior During Therapy WFL for tasks assessed/performed              Past Medical History:  Diagnosis Date   Abnormally small mouth    Achilles tendon disorder, right    Allergy    Anemia    Anxiety    Arthritis    hips and knees   Asthma    daily inhaler, prn inhaler and neb.   Asthma due to environmental allergies    Basal cell carcinoma    nose, s/p mohs.   Breast cancer (HCC) 01/2014   left   Cataract    Chest pain    CHF (congestive heart failure) (HCC)    COPD (chronic obstructive pulmonary disease) (HCC)    Dental bridge present    upper front and lower right   Dental crowns present    x 3   Depression    Esophageal spasm    reports since her chemo and breast surgery  she developed esophageal spasms and reports this is in the past has casue her 02 to desat in the  70s , denies sycnope in relation to this , does report hx of vertigo as well    Family history of anesthesia complication    twin brother aspirated and died on OR table, per pt.   Fibromyalgia    Gallbladder disease    Gallstones    GERD (gastroesophageal reflux disease)    Glaucoma    H/O blood clots    had blood to  PICC line    History of gastric ulcer    as a teenager   History of seizure age 1   as a reaction to Penicillin - no seizures since   History of stomach ulcers    History of thyroid cancer    s/p thyroidectomy   HTN (hypertension)    Hyperlipidemia    Hypothyroidism    Joint pain    Left knee injury    Liver  disorder    seen when she had her hysterectomy , reports she was told it was  " small lesion" ; but asymptomatic    Lymphedema    left arm   Migraines    Multiple food allergies    Obesity    OSA (obstructive sleep apnea) 02/16/2016   CPAP   Palpitations    reports no longer experiences   Personal history of chemotherapy 2015   Personal history of radiation therapy 2015   Left   Prediabetes    Rheumatoid arthritis (HCC)    Sleep apnea    SOB (shortness of breath)    Swelling of both ankles    Tinnitus    UTI (lower urinary tract infection) 02/17/2014   Vertigo    last episode  was 2 weeks ago    Vitamin D deficiency    Wears contact lenses    left eye only    Wears hearing aid  in both ears    Past Surgical History:  Procedure Laterality Date   ABDOMINAL HYSTERECTOMY  2004   complete   ACHILLES TENDON REPAIR Right    APPENDECTOMY  2004   BREAST EXCISIONAL BIOPSY     BREAST LUMPECTOMY Left    2015   BREAST LUMPECTOMY WITH NEEDLE LOCALIZATION AND AXILLARY SENTINEL LYMPH NODE BX Left 02/23/2014   Procedure: BREAST LUMPECTOMY WITH NEEDLE LOCALIZATION AND AXILLARY SENTINEL LYMPH NODE BIOPSY;  Surgeon: Glenna Fellows, MD;  Location: [facility];  Service: General;  Laterality: Left;   BREAST SURGERY  2011   left breast biposy   CHOLECYSTECTOMY  1990   COLONOSCOPY W/ POLYPECTOMY  06/2009   EYE SURGERY Right 2013   exc. warts from underneath eyelid   INCONTINENCE SURGERY  2004   KNEE ARTHROSCOPY Bilateral    x 6 each knee   LIGAMENT REPAIR Right    thumb/wrist   ORIF TOE FRACTURE Right    great toe   PORTACATH PLACEMENT N/A 03/24/2014   Procedure: INSERTION PORT-A-CATH;  Surgeon: Glenna Fellows, MD;  Location: [facility];  Service: General;  Laterality: N/A;; removed    THYROIDECTOMY  2000   TONSILLECTOMY AND ADENOIDECTOMY  2000   TOTAL KNEE ARTHROPLASTY Left 12/03/2017   Procedure: LEFT TOTAL KNEE ARTHROPLASTY;  Surgeon: Ollen Gross, MD;  Location: [facility];  Service: Orthopedics;  Laterality: Left;   TUBAL LIGATION  1981   TUMOR EXCISION     from thoracic spine   Patient Active Problem List   Diagnosis Date Noted   SOB (shortness of breath) on exertion 10/03/2022   Bronchitis 05/08/2022   Polyphagia 04/27/2022   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   Abdominal pannus 03/02/2022   Class 3 severe obesity with serious comorbidity and body mass index (BMI) of 40.0 to 44.9 in adult [facility]) 03/02/2022   Vertigo 02/07/2022   Peroneal tendinitis of left lower extremity 02/07/2022   Visceral obesity 02/01/2022   Sacral pain 09/20/2021   Pain in right knee 09/13/2021   Chronic left-sided low back pain with left-sided sciatica 04/27/2021   Nondisplaced fracture of middle phalanx of left lesser toe(s), initial encounter for closed fracture 09/17/2020   Chronic diastolic CHF (congestive heart failure) (HCC) 08/23/2019   Retinal artery occlusion 08/22/2019   History of total left knee replacement 12/07/2017   OA (osteoarthritis) of knee 12/03/2017   Morbid obesity (HCC) 01/01/2017   Essential hypertension 10/18/2016   Constipation 10/04/2016   Vitamin D deficiency 10/04/2016   Prediabetes 10/04/2016   BMI 39.0-39.9,adult 09/20/2016   OSA (obstructive sleep apnea) 02/16/2016   Malignant neoplasm of thyroid gland (HCC) 10/11/2015   De Quervain's tenosynovitis, bilateral 09/15/2015   Trigger finger, acquired 06/22/2015   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/10/2015   Postmenopausal osteoporosis 12/08/2014   Preventative health care 12/01/2014   DVT (deep venous thrombosis) (HCC) 08/28/2014   Lymphedema of arm 08/28/2014   Mucositis due to chemotherapy 06/02/2014   Menopausal syndrome (hot flashes) 12/31/2013   Breast cancer of upper-outer quadrant of left female breast (HCC) 12/26/2013   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   Gingivitis 12/05/2013   Seasonal allergies 09/24/2013    Hyperlipidemia 06/02/2011   Migraine 06/02/2011   Tinnitus of both ears 02/14/2011   Fibromyalgia 05/10/2010   Asthma 03/22/2010   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 She has tried reducing pressure with exercise but no change. Pt states it throbs with sitting still and notices more with sitting after moving.   Initial Knee Eval:  Pt reports she has had 2 injections for the knee but they wear off. She reports the L knee pain started getting worse about 8 months ago. Pt has history of fibromyalgia and OA. Pt does do Silver Sneakers- sidestepping, weight lifting, squatting, chair exercise. Walks 30 mins a day- does wear a brace on the R knee. Has access to a pool in Summer. Member at [employer]. Pt is trying to delay the R TKA as much possible. Can walk about 30-35 mins before aching. Will bring a cane for long distance. No specific MOI. Has treadmill, upright bike, hand weights and kettle bells that go up to 15lbs. Pt denies cancer red flags. Pt denies NT.   PERTINENT HISTORY: Gel and Zilretta injections; L TKA; bladder stimulator 3/8; fibro, HTN;  PAIN:  Are you having pain? Yes: NPRS scale: 3/10 Pain location: lateral  Pain description: aching Aggravating factors: stairs, inclines, especially down Relieving factors: icing, elevating, resting  PRECAUTIONS: cannot lay supine due to bladder  implant  WEIGHT BEARING RESTRICTIONS: No  FALLS:  Has patient fallen in last 6 months? 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 No acute osseus changes s/p  left knee replacement without any sign of loosening   PATIENT SURVEYS:   FOTO unable due to late arrival  COGNITION: Overall cognitive status: Within functional limits for tasks assessed     SENSATION: WFL   MUSCLE LENGTH: Hamstrings: unable to lay supine due to bladder surgery Positive Ely's on R  POSTURE: rounded shoulders and increased thoracic kyphosis  PALPATION: TTP of R knee joint line, inferio-medial patellar pole, patellar tendon; hypertonicity of L quad and HS  7/9 Large trigger point in medial calf   LOWER EXTREMITY ROM: L knee ROM lacking likely due to history of TKA  Active ROM *Right* eval Left eval  Hip flexion 110 110  Hip extension 0 0  Hip abduction    Hip adduction    Hip internal rotation    Hip external rotation    Knee flexion 125 120  Knee extension 0 -2  Ankle dorsiflexion 15 0  Ankle plantarflexion    Ankle inversion    Ankle eversion     (Blank rows = not tested)  LOWER EXTREMITY MMT:  MMT Right eval Left eval  Hip flexion    Hip extension    Hip abduction 37.7 43.8  Hip adduction  Hip internal rotation    Hip external rotation    Knee flexion 37.1 34.1  Knee extension 55.3 61.8  Ankle dorsiflexion    Ankle plantarflexion    Ankle inversion    Ankle eversion     (Blank rows = not tested)  LOWER  EXTREMITY SPECIAL TESTS:  Knee special tests: Anterior drawer test: negative, Thessaly test: positive , and Step up/down test: positive   FUNCTIONAL TESTS:  5 times sit to stand: 8.7s   GAIT: Distance walked: 53ft Assistive device utilized: None Level of assistance: Complete Independence Comments: mild toe out, decreased TKE on R  Steps: able to ascend and descend reciprocally but requires UE with descent and pain with L LE lowering/ R eccentric   TODAY'S TREATMENT:                                                                                                                              DATE:   7/17  TTP region of ATFL, fibularis tendon, base of 5th met  Gastroc stretch 30s 3x  DL woodpecker 3s 2N56 HR at wall iso hold 3s 2x10 YTB ankle ev isometrics 3s 2x12 to neutral  7/9 Perform reassessment on patient ankle     PATIENT EDUCATION:  Education details: anatomy, exercise progression, DOMS expectations, muscle firing,  envelope of function, HEP, POC   Person educated: Patient Education method: Explanation, Demonstration, Tactile cues, Verbal cues, and Handouts Education comprehension: verbalized understanding, returned demonstration, verbal cues required, and tactile cues required   HOME EXERCISE PROGRAM:  Access Code: [facility] URL: [email or web address] Date: 08/01/2022 Prepared by: Zebedee Iba  [phone] (ankle)    Aquatic HEP  [phone]   ASSESSMENT:   CLINICAL IMPRESSION: Patient with increased pain with home exercise program at previous session.  Likely due to increase in resisted active range of motion to endrange.  Patient's pain especially prominent with endrange inversion and eversion, so treatment today focused on isometrics for pain relief and double leg stability.  Patient reports reduction of pain as well as stiffness by end of session.  Home exercise program updated today and fully modified with edu given about acceptable levels of pain  and exercise modifications.  If no increase in pain with new HEP consider single-leg balance or plant surface training.  OBJECTIVE IMPAIRMENTS decreased mobility, difficulty walking, decreased ROM, decreased strength, increased muscle spasms, improper body mechanics, postural dysfunction, and pain.    ACTIVITY LIMITATIONS lifting, sitting, standing, squatting, stairs, transfers, locomotion level   PARTICIPATION LIMITATIONS: cleaning, laundry, driving, shopping, community activity, occupation, yard work, and exercise   PERSONAL FACTORS Age, Fitness, Time since onset of injury/illness/exacerbation, and 3+ comorbidity:    are also affecting patient's functional outcome.    REHAB POTENTIAL: good   CLINICAL DECISION MAKING: stable/uncomplicated   EVALUATION COMPLEXITY: low     GOALS:     SHORT TERM GOALS: Target date: 09/12/2022      Pt will become independent with HEP in  order to demonstrate synthesis of PT education..   Goal status: INITIAL   2.  STG FOTO to be set when able.      Goal status: INITIAL   3.  Pt will be able to demonstrate/report ability to sit/stand/sleep for extended periods of time without pain in order to demonstrate functional improvement and tolerance to static positioning.    Goal status: INITIAL  4.  Patient will increase active ankle dorsiflexion to 7 degrees. Goal status: Initial  5.  Patient will demonstrate 20 seconds of single-leg stance time without increased pain Goal status: Initial     LONG TERM GOALS: Target date: 10/24/2022      Pt  will become independent with final HEP in order to demonstrate synthesis of PT education.   Goal status: INITIAL   2.  FOTO LTG to be set when able    Goal status: INITIAL   3.  Pt will be able to demonstrate squat to at least parallel in order to demonstrate functional improvement in LE function for self-care and house hold duties.    Goal status: INITIAL   4.  Pt will be able to demonstrate  reciprocal stair management without pain in order to demonstrate functional improvement in LE function for self-care and house hold duties. .    Goal status: INITIAL   5.  Patient will ambulate community distances without increased left ankle pain. Goal status: Initial  6.  Patient will have complete ankle program for gym at home. Goal status: Initial       PLAN: PT FREQUENCY: 1x/week (likely every other)   PT DURATION: 12 weeks (likely DC in 8-10wks)   PLANNED INTERVENTIONS: Therapeutic exercises, Therapeutic activity, Neuromuscular re-education, Balance training, Gait training, Patient/Family education, Self Care, and Joint mobilization   PLAN FOR NEXT SESSION: continue with R quad and hip strength, aquatics for R knee strength and stability; pt is very high functioning. 3x pool visit to establish HEP and then 3x land visits for gym  Ankle: Consider progressing to Airex exercises.  Reviewed technique with heel raise.  Patient has pain when trying to perform heel raises at Silver sneakers class.  See if we can modify it to cause less pain.  If patient does well there ex shoulder her where to purchase the air-ex.   Zebedee Iba, PT 12/06/2022, 1:13 PM

## 2022-12-08 ENCOUNTER — Other Ambulatory Visit: Payer: Self-pay | Admitting: Family

## 2022-12-08 NOTE — Telephone Encounter (Signed)
Mychart message was sent to patient to let her know her sleep study result's  Sleep study result   Date of study: 09/14/2022   Impression:   Negative study for significant sleep apnea Mild oxygen desaturations   Recommendation:   With significant weight loss, may not need to continue using CPAP therapy if there is no ongoing symptoms of daytime sleepiness and fatigue   Continue weight loss efforts   Call with significant concerns  Nothing else further needed at this time

## 2022-12-11 ENCOUNTER — Other Ambulatory Visit: Payer: Self-pay | Admitting: Family

## 2022-12-11 ENCOUNTER — Encounter: Payer: Self-pay | Admitting: Family

## 2022-12-11 NOTE — Telephone Encounter (Signed)
Requesting: tramadol 50mg  Contract: No UDS: none Last Visit: 09/29/2022 Next Visit: 02/12/2023 Last Refill: 03/07/22  Please Advise

## 2022-12-12 ENCOUNTER — Ambulatory Visit (INDEPENDENT_AMBULATORY_CARE_PROVIDER_SITE_OTHER): Payer: Medicare Other | Admitting: Nurse Practitioner

## 2022-12-12 ENCOUNTER — Encounter: Payer: Self-pay | Admitting: Nurse Practitioner

## 2022-12-12 VITALS — BP 126/81 | HR 68 | Temp 97.6°F | Ht 66.0 in | Wt 244.0 lb

## 2022-12-12 DIAGNOSIS — R7303 Prediabetes: Secondary | ICD-10-CM

## 2022-12-12 DIAGNOSIS — Z6839 Body mass index (BMI) 39.0-39.9, adult: Secondary | ICD-10-CM

## 2022-12-12 MED ORDER — SEMAGLUTIDE (2 MG/DOSE) 8 MG/3ML ~~LOC~~ SOPN: 2.0000 mg | PEN_INJECTOR | SUBCUTANEOUS | 0 refills | Status: DC

## 2022-12-12 NOTE — Progress Notes (Deleted)
Office: [phone]  /  Fax: [phone]  WEIGHT SUMMARY AND BIOMETRICS  Weight Lost Since Last Visit: 2lb  Weight Gained Since Last Visit: 0lb   Vitals Temp: 97.6 F (36.4 C) BP: 126/81 Pulse Rate: 68 SpO2: 98 %   Anthropometric Measurements Height: 5\' 6"  (1.676 m) Weight: 244 lb (110.7 kg) BMI (Calculated): 39.4 Weight at Last Visit: 246lb Weight Lost Since Last Visit: 2lb Weight Gained Since Last Visit: 0lb Starting Weight: 275lb Total Weight Loss (lbs): 31 lb (14.1 kg)   Body Composition  Body Fat %: 48.5 % Fat Mass (lbs): 118.6 lbs Muscle Mass (lbs): 119.4 lbs Total Body Water (lbs): 87.2 lbs Visceral Fat Rating : 16   Other Clinical Data Fasting: No Labs: No Today's Visit #: 9 Starting Date: 07/28/21     HPI  Chief Complaint: OBESITY  Michelle Kennedy is here to discuss her progress with her obesity treatment plan. She is on the keeping a food journal and adhering to recommended goals of 1600 calories and 90-100 protein and states she is following her eating plan approximately 30 % of the time. She states she is exercising 60 minutes 2 days per week.   Interval History:  Since last office visit she ***   Pharmacotherapy for weight loss: She {srtis (Optional):29129} currently taking {srtpreviousweightlossmeds (Optional):29124} for medical weight loss.  Denies side effects.    Previous pharmacotherapy for medical weight loss:  {srtpreviousweightlossmeds (Optional):29124}  She stopped *** due to side effects of ***.  Bariatric surgery:  Patient is status post {srtweightlosssurgery:29125} by Dr.  Marland Kitchen in ***.  Her highest weight prior to surgery was *** and her nadir weight after surgery was ***.  She is taking {srtvitamins (Optional):29126}.  She reports {srtrestriction (Optional):29127} restriction.     PHYSICAL EXAM:  Blood pressure 126/81, pulse 68, temperature 97.6 F (36.4 C), height 5\' 6"  (1.676 m), weight 244 lb (110.7 kg), SpO2 98%. Body mass  index is 39.38 kg/m.  General: She is overweight, cooperative, alert, well developed, and in no acute distress. PSYCH: Has normal mood, affect and thought process.   Extremities: No edema.  Neurologic: No gross sensory or motor deficits. No tremors or fasciculations noted.    DIAGNOSTIC DATA REVIEWED:  BMET    Component Value Date/Time   NA 141 07/06/2022 1034   NA 139 01/23/2017 1147   K 4.7 07/06/2022 1034   K 3.9 01/23/2017 1147   CL 103 07/06/2022 1034   CO2 22 07/06/2022 1034   CO2 26 01/23/2017 1147   GLUCOSE 90 07/06/2022 1034   GLUCOSE 105 (H) 10/28/2021 1531   GLUCOSE 114 01/23/2017 1147   BUN 17 07/06/2022 1034   BUN 15.2 01/23/2017 1147   CREATININE 0.95 07/06/2022 1034   CREATININE 0.75 10/28/2021 1531   CREATININE 1.0 01/23/2017 1147   CALCIUM 9.7 07/06/2022 1034   CALCIUM 10.0 01/23/2017 1147   GFRNONAA >60 12/30/2019 1235   GFRAA >60 12/30/2019 1235   Lab Results  Component Value Date   HGBA1C 5.7 (A) 10/03/2022   HGBA1C 5.8 (H) 12/17/2014   Lab Results  Component Value Date   INSULIN 18.0 07/06/2022   INSULIN 21.8 09/20/2016   Lab Results  Component Value Date   TSH 1.12 10/03/2022   CBC    Component Value Date/Time   WBC 5.1 07/28/2021 0904   WBC 7.4 12/30/2019 1235   RBC 5.91 (H) 07/28/2021 0904   RBC 5.88 (H) 12/30/2019 1235   HGB 14.3 07/28/2021 0904   HGB 13.7  01/23/2017 1147   HCT 45.0 07/28/2021 0904   HCT 41.4 01/23/2017 1147   PLT 255 07/28/2021 0904   MCV 76 (L) 07/28/2021 0904   MCV 82.8 01/23/2017 1147   MCH 24.2 (L) 07/28/2021 0904   MCH 24.7 (L) 12/30/2019 1235   MCHC 31.8 07/28/2021 0904   MCHC 31.5 12/30/2019 1235   RDW 16.4 (H) 07/28/2021 0904   RDW 15.8 (H) 01/23/2017 1147   Iron Studies    Component Value Date/Time   IRON 71 06/23/2020 1045   TIBC 294 06/23/2020 1045   FERRITIN 94.6 06/23/2020 1045   IRONPCTSAT 24 06/23/2020 1045   Lipid Panel     Component Value Date/Time   CHOL 170 07/06/2022 1034    TRIG 85 07/06/2022 1034   HDL 73 07/06/2022 1034   CHOLHDL 3 06/28/2020 1315   VLDL 31.4 06/28/2020 1315   LDLCALC 81 07/06/2022 1034   LDLDIRECT 117.0 02/26/2019 1345   Hepatic Function Panel     Component Value Date/Time   PROT 6.5 09/13/2022 0959   PROT 7.0 07/06/2022 1034   PROT 6.9 01/23/2017 1147   ALBUMIN 4.2 09/13/2022 0959   ALBUMIN 4.6 07/06/2022 1034   ALBUMIN 3.8 01/23/2017 1147   AST 16 09/13/2022 0959   AST 24 01/23/2017 1147   ALT 19 09/13/2022 0959   ALT 39 01/23/2017 1147   ALKPHOS 58 09/13/2022 0959   ALKPHOS 72 01/23/2017 1147   BILITOT 0.4 09/13/2022 0959   BILITOT 0.4 07/06/2022 1034   BILITOT 0.41 01/23/2017 1147   BILIDIR 0.1 09/13/2022 0959   BILIDIR 0.13 07/20/2021 0933   IBILI 0.3 08/11/2013 0918      Component Value Date/Time   TSH 1.12 10/03/2022 1601   Nutritional Lab Results  Component Value Date   VD25OH 40.4 07/06/2022   VD25OH 53.4 07/28/2021   VD25OH 41.4 09/20/2016     ASSESSMENT AND PLAN  TREATMENT PLAN FOR OBESITY:  Recommended Dietary Goals  Catheryne is currently in the action stage of change. As such, her goal is to continue weight management plan. She has agreed to {MWMwtlossportion/plan2:23431}.  Behavioral Intervention  We discussed the following Behavioral Modification Strategies today: {EMWMwtlossstrategies:28914::"increasing lean protein intake","decreasing simple carbohydrates ","increasing vegetables","increasing lower glycemic fruits","increasing water intake","continue to practice mindfulness when eating","planning for success"}.  Additional resources provided today: NA  Recommended Physical Activity Goals  Keondria has been advised to work up to 150 minutes of moderate intensity aerobic activity a week and strengthening exercises 2-3 times per week for cardiovascular health, weight loss maintenance and preservation of muscle mass.   She has agreed to {EMEXERCISE:28847::"Think about ways to increase daily  physical activity and overcoming barriers to exercise"}   Pharmacotherapy We discussed various medication options to help Jaella with her weight loss efforts and we both agreed to ***.  ASSOCIATED CONDITIONS ADDRESSED TODAY  Action/Plan  There are no diagnoses linked to this encounter.       No follow-ups on file.Marland Kitchen She was informed of the importance of frequent follow up visits to maximize her success with intensive lifestyle modifications for her multiple health conditions.   ATTESTASTION STATEMENTS:  Reviewed by clinician on day of visit: allergies, medications, problem list, medical history, surgical history, family history, social history, and previous encounter notes.   Time spent on visit including pre-visit chart review and post-visit care and charting was *** minutes.    Theodis Sato. Tickerhoff FNP-C
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
## 2022-12-18 ENCOUNTER — Telehealth: Payer: Self-pay | Admitting: Radiology

## 2022-12-18 NOTE — Telephone Encounter (Signed)
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   Merri Brunette, RT(R)(T) Clinical Research Coordinator

## 2022-12-20 ENCOUNTER — Encounter (HOSPITAL_BASED_OUTPATIENT_CLINIC_OR_DEPARTMENT_OTHER): Payer: Medicare Other | Admitting: Physical Therapy

## 2022-12-22 ENCOUNTER — Telehealth: Payer: Self-pay | Admitting: Radiology

## 2022-12-22 NOTE — Telephone Encounter (Signed)
[name]: PALBOCICLIB COLLABORATIVE ADJUVANT STUDY: A RANDOMIZED PHASE III TRIAL OF PALBOCICLIB WITH STANDARD ADJUVANT ENDOCRINE THERAPY VERSUS STANDARD ADJUVANT ENDOCRINE THERAPY ALONE FOR HORMONE RECEPTOR POSITIVE (HR+)/HUMAN EPIDERMAL GROWTH FACTOR RECEPTOR 2 (HER2)-NEGATIVE EARLY BREAST CANCER   12/22/22  PHONE CALL: LVM for patient to return call to complete 96 month visit for the above mentioned study.   Merri Brunette, RT(R)(T) Clinical Research Coordinator

## 2022-12-29 ENCOUNTER — Telehealth: Payer: Self-pay | Admitting: Family

## 2022-12-29 DIAGNOSIS — U071 COVID-19: Secondary | ICD-10-CM

## 2022-12-29 NOTE — Telephone Encounter (Signed)
Pt stated she has asthma and think she has Covid. She asked what she should do, I encouraged for her to take a test to know for sure if its Covid & if she tested positive to set up an appointment with Melissa. Pt declined to set up an appt for now & requested to just make Melissa aware of her symptoms. She stated that if Efraim Kaufmann has any suggestions to contatct her. Please advise w/ pt.

## 2022-12-30 ENCOUNTER — Other Ambulatory Visit: Payer: Self-pay | Admitting: Internal Medicine

## 2023-01-02 DIAGNOSIS — U071 COVID-19: Secondary | ICD-10-CM | POA: Insufficient documentation

## 2023-01-02 MED ORDER — MOLNUPIRAVIR 200 MG PO CAPS: 4.0000 | ORAL_CAPSULE | Freq: Two times a day (BID) | ORAL | 0 refills | Status: AC

## 2023-01-02 NOTE — Addendum Note (Signed)
Addended by: Sandford Craze on: 01/02/2023 11:42 AM   Modules accepted: Orders

## 2023-01-02 NOTE — Telephone Encounter (Signed)
Please see the MyChart message reply(ies) for my assessment and plan.  The patient gave consent for this Medical Advice Message and is aware that it may result in a bill to their insurance company as well as the possibility that this may result in a co-payment or deductible. They are an established patient, but are not seeking medical advice exclusively about a problem treated during an in person or video visit in the last 7 days. I did not recommend an in person or video visit within 7 days of my reply.  I spent a total of 5 minutes cumulative provider time within 7 days through [employer].  Nance Pear, NP

## 2023-01-03 MED ORDER — PAXLOVID (300/100) 20 X 150 MG & 10 X 100MG PO TBPK: ORAL_TABLET | ORAL | 0 refills | Status: DC

## 2023-01-03 NOTE — Addendum Note (Signed)
Addended by: Sandford Craze on: 01/03/2023 03:09 PM   Modules accepted: Orders

## 2023-01-04 ENCOUNTER — Encounter (INDEPENDENT_AMBULATORY_CARE_PROVIDER_SITE_OTHER): Payer: Self-pay

## 2023-01-08 ENCOUNTER — Telehealth: Payer: Self-pay | Admitting: Radiology

## 2023-01-08 NOTE — Telephone Encounter (Signed)
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 Based on medical record, patient has no new medical history and completed Anastrozole in June 2024.   Merri Brunette, RT(R)(T) Clinical Research Coordinator

## 2023-01-16 ENCOUNTER — Ambulatory Visit (INDEPENDENT_AMBULATORY_CARE_PROVIDER_SITE_OTHER): Payer: Medicare Other | Admitting: Bariatrics

## 2023-01-16 ENCOUNTER — Encounter: Payer: Self-pay | Admitting: Bariatrics

## 2023-01-16 VITALS — BP 120/76 | HR 67 | Temp 97.9°F | Ht 66.0 in | Wt 245.0 lb

## 2023-01-16 DIAGNOSIS — E782 Mixed hyperlipidemia: Secondary | ICD-10-CM

## 2023-01-16 DIAGNOSIS — R7303 Prediabetes: Secondary | ICD-10-CM

## 2023-01-16 DIAGNOSIS — Z6839 Body mass index (BMI) 39.0-39.9, adult: Secondary | ICD-10-CM

## 2023-01-16 DIAGNOSIS — E785 Hyperlipidemia, unspecified: Secondary | ICD-10-CM | POA: Diagnosis not present

## 2023-01-16 DIAGNOSIS — Z6841 Body Mass Index (BMI) 40.0 and over, adult: Secondary | ICD-10-CM

## 2023-01-16 MED ORDER — SEMAGLUTIDE (2 MG/DOSE) 8 MG/3ML ~~LOC~~ SOPN: 2.0000 mg | PEN_INJECTOR | SUBCUTANEOUS | 0 refills | Status: DC

## 2023-01-16 NOTE — Progress Notes (Unsigned)
   Rubin Payor, PhD, LAT, ATC acting as a scribe for [name], MD.  Michelle Kennedy is a 67 y.o. female who presents to [facility] at [facility] today for low back pain. Pt was previously seen by Dr. Denyse Amass on 11/21/22 for chronic R knee pain.  Today, pt c/o LBP x ***. Pt locates pain to ***  Radiating pain: LE numbness/tingling: LE weakness: Aggravates: Treatments tried:  Dx imaging: 09/20/21 Sacrum/coccyx XR  Pertinent review of systems: ***  Relevant historical information: ***   Exam:  There were no vitals taken for this visit. General: Well Developed, well nourished, and in no acute distress.   MSK: ***    Lab and Radiology Results No results found. However, due to the size of the patient record, not all encounters were searched. Please check Results Review for a complete set of results. No results found.     Assessment and Plan: 67 y.o. female with ***   PDMP not reviewed this encounter. No orders of the defined types were placed in this encounter.  No orders of the defined types were placed in this encounter.    Discussed warning signs or symptoms. Please see discharge instructions. Patient expresses understanding.   ***

## 2023-01-16 NOTE — Progress Notes (Unsigned)
WEIGHT SUMMARY AND BIOMETRICS  Weight Gained Since Last Visit: 1lb   Vitals Temp: 97.9 F (36.6 C) BP: 120/76 Pulse Rate: 67 SpO2: 96 %   Anthropometric Measurements Height: 5\' 6"  (1.676 m) Weight: 245 lb (111.1 kg) BMI (Calculated): 39.56 Weight at Last Visit: 244lb Weight Lost Since Last Visit: 0 Weight Gained Since Last Visit: 1lb Starting Weight: 275lb Total Weight Loss (lbs): 30 lb (13.6 kg)   Body Composition  Body Fat %: 49.6 % Fat Mass (lbs): 121.8 lbs Muscle Mass (lbs): 117.6 lbs Total Body Water (lbs): 90.8 lbs Visceral Fat Rating : 17   Other Clinical Data Fasting: no Labs: no Today's Visit #: 11 Starting Date: 07/28/21    OBESITY Michelle Kennedy is here to discuss her progress with her obesity treatment plan along with follow-up of her obesity related diagnoses.     Nutrition Plan: keeping a food journal with goal of 1600 calories and 90 grams of protein daily - 70% adherence.  Current exercise: none  Interim History:  She is up 1 lb since her last visit. She has been sick from Covid 19 and is just now getting her energy back.  Eating all of the food on the plan., Protein intake is less than prescribed., and Water intake is adequate.  Pharmacotherapy: [name] is on Ozempic 2 mg SQ weekly Adverse side effects: None Hunger is moderately controlled.  Cravings are moderately controlled.  Assessment/Plan:   1. Prediabetes Prediabetes Last A1c was 5.7  Medication(s):  Ozempic 2 mg SQ weekly Lab Results  Component Value Date   HGBA1C 5.7 (A) 10/03/2022   HGBA1C 5.7 (H) 07/06/2022   HGBA1C 5.7 (A) 02/02/2022   HGBA1C 5.6 10/28/2021   HGBA1C 5.8 (H) 07/28/2021   Lab Results  Component Value Date   INSULIN 18.0 07/06/2022   INSULIN 25.6 (H) 07/28/2021   INSULIN 21.8 09/20/2016    Plan:  Will minimize all refined carbohydrates both sweets and starches.  Will work on the plan and exercise.  Resume both aerobic and resistance  training.  Will keep protein, water, and fiber intake high.  Increase healthy fats.  Aim for 7 to 9 hours of sleep nightly.  Will continue medications.   Hyperlipidemia LDL is at goal. Medication(s): Crestor and fish oil  Cardiovascular risk factors: advanced age (older than 66 for men, 8 for women), dyslipidemia, obesity (BMI >= 30 kg/m2), and sedentary lifestyle  Lab Results  Component Value Date   CHOL 170 07/06/2022   HDL 73 07/06/2022   LDLCALC 81 07/06/2022   LDLDIRECT 117.0 02/26/2019   TRIG 85 07/06/2022   CHOLHDL 3 06/28/2020   Lab Results  Component Value Date   ALT 19 09/13/2022   AST 16 09/13/2022   ALKPHOS 58 09/13/2022   BILITOT 0.4 09/13/2022   The 10-year ASCVD risk score (Arnett DK, et al., 2019) is: 10.3%   Values used to calculate the score:     Age: 67 years     Sex: Female     Is Non-Hispanic African American: No     Diabetic: No     Tobacco smoker: No     Systolic Blood Pressure: 138 mmHg     Is BP treated: Yes     HDL Cholesterol: 73 mg/dL     Total Cholesterol: 170 mg/dL  Plan:  Continue statin  and fish oil  Information sheet on healthy vs unhealthy fats.  Will avoid all trans fats.  Will read labels Will minimize saturated fats except  the following: low fat meats in moderation, diary, and limited dark chocolate.        Morbid Obesity: Current BMI BMI (Calculated): 39.56   Pharmacotherapy Plan Continue and refill  Ozempic 2 mg SQ weekly  [name] is currently in the action stage of change. As such, her goal is to continue with weight loss efforts.  She has agreed to keeping a food journal with goal of 1,600 calories and 90 grams of protein daily.  Exercise goals: Older adults should determine their level of effort for physical activity relative to their level of fitness.   Behavioral modification strategies: increasing lean protein intake, decreasing simple carbohydrates , no meal skipping, meal planning , increase water intake,  better snacking choices, planning for success, increasing vegetables, increasing lower sugar fruits, increasing fiber rich foods, avoiding temptations, and mindful eating.  [name] has agreed to follow-up with our clinic in 6 weeks.      Objective:   VITALS: Per patient if applicable, see vitals. GENERAL: Alert and in no acute distress. CARDIOPULMONARY: No increased WOB. Speaking in clear sentences.  PSYCH: Pleasant and cooperative. Speech normal rate and rhythm. Affect is appropriate. Insight and judgement are appropriate. Attention is focused, linear, and appropriate.  NEURO: Oriented as arrived to appointment on time with no prompting.   Attestation Statements:    This was prepared with the assistance of Engineer, civil (consulting).  Occasional wrong-word or sound-a-like substitutions may have occurred due to the inherent limitations of voice recognition software.   Corinna Capra, DO

## 2023-01-17 ENCOUNTER — Ambulatory Visit (INDEPENDENT_AMBULATORY_CARE_PROVIDER_SITE_OTHER): Payer: Medicare Other | Admitting: Family Medicine

## 2023-01-17 ENCOUNTER — Ambulatory Visit (INDEPENDENT_AMBULATORY_CARE_PROVIDER_SITE_OTHER): Payer: Medicare Other

## 2023-01-17 ENCOUNTER — Other Ambulatory Visit: Payer: Self-pay

## 2023-01-17 VITALS — BP 138/84 | HR 65 | Ht 66.0 in | Wt 249.0 lb

## 2023-01-17 DIAGNOSIS — M533 Sacrococcygeal disorders, not elsewhere classified: Secondary | ICD-10-CM | POA: Diagnosis not present

## 2023-01-17 DIAGNOSIS — M545 Low back pain, unspecified: Secondary | ICD-10-CM | POA: Diagnosis not present

## 2023-01-17 DIAGNOSIS — G8929 Other chronic pain: Secondary | ICD-10-CM

## 2023-01-17 MED ORDER — METHOCARBAMOL 500 MG PO TABS: 500.0000 mg | ORAL_TABLET | Freq: Three times a day (TID) | ORAL | 0 refills | Status: DC | PRN

## 2023-01-17 MED ORDER — TRAMADOL HCL 50 MG PO TABS: 50.0000 mg | ORAL_TABLET | Freq: Four times a day (QID) | ORAL | 0 refills | Status: DC | PRN

## 2023-01-17 NOTE — Patient Instructions (Addendum)
Thank you for coming in today.   Call or go to the ER if you develop a large red swollen joint with extreme pain or oozing puss.    Please get an Xray today before you leave   If not improving we can do PT.

## 2023-01-19 ENCOUNTER — Encounter: Payer: Self-pay | Admitting: Radiology

## 2023-01-19 DIAGNOSIS — Z170 Estrogen receptor positive status [ER+]: Secondary | ICD-10-CM

## 2023-01-19 DIAGNOSIS — C50412 Malignant neoplasm of upper-outer quadrant of left female breast: Secondary | ICD-10-CM

## 2023-01-19 NOTE — Research (Addendum)
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 Patient returned call to complete 96 month visit for the above mentioned study. Patient confirmed she is doing well overall with no concerning signs or symptoms.   Antineoplastic therapy: Patient stopped tamoxifen in March 2024 and has not started any new anti cancer therapies since that time.    Disease Monitoring: She stated she still needs to schedule her yearly mammogram, but plans on doing this soon. This coordinator offered assistance with scheduling.    Adverse Events Review: No SAEs since last study visit.    [id number] testing/vaccination: Patient was diagnosed with COVID about 4 weeks ago and is now finally feeling better.   Plan:  Patient continues Follow Up Phase 2 on study with annual follow up by phone or in clinic. Next follow up is due 12/18/23 (+/- 56 days). Encouraged her to call research nurse if any questions prior to next contact.  Thanked patient so much for her ongoing participation in this study. Patient verbalized understanding.   This coordinator informed patient the 10 yr blood collection is now optional for this study. She stated she is okay with completing this blood collection which will be due in 2026.   Merri Brunette, RT(R)(T) Clinical Research Coordinator

## 2023-01-23 ENCOUNTER — Telehealth: Payer: Self-pay | Admitting: Adult Health

## 2023-01-23 ENCOUNTER — Other Ambulatory Visit: Payer: Self-pay | Admitting: Hematology and Oncology

## 2023-01-23 DIAGNOSIS — Z1231 Encounter for screening mammogram for malignant neoplasm of breast: Secondary | ICD-10-CM

## 2023-01-23 NOTE — Telephone Encounter (Signed)
Per Lindsey Causey,DNP, called pt with message below. Pt verbalized understanding 

## 2023-01-23 NOTE — Progress Notes (Signed)
Lumbar spine x-ray shows arthritis.

## 2023-01-23 NOTE — Telephone Encounter (Signed)
-----   Message from Noreene Filbert sent at 01/23/2023  3:14 PM EDT ----- Please let patient know xrays show degeneration no other abnormality. ----- Message ----- From: Interface, Rad Results In Sent: 01/22/2023   9:40 AM EDT To: Serena Croissant, MD

## 2023-01-23 NOTE — Progress Notes (Signed)
X-ray of the sacrum and coccyx looks okay to radiology.  No fractures are present.  The bladder stimulator looks okay.

## 2023-01-24 ENCOUNTER — Ambulatory Visit
Admission: RE | Admit: 2023-01-24 | Discharge: 2023-01-24 | Disposition: A | Discharge status: Home / Self Care | Payer: Medicare Other | Source: Ambulatory Visit | Attending: Hematology and Oncology | Admitting: Hematology and Oncology

## 2023-01-24 DIAGNOSIS — Z1231 Encounter for screening mammogram for malignant neoplasm of breast: Secondary | ICD-10-CM

## 2023-01-24 NOTE — Telephone Encounter (Signed)
VOB initiated for ZILRETTA for RIGHT knee OA.

## 2023-01-25 NOTE — Telephone Encounter (Signed)
Holding until needed.

## 2023-01-25 NOTE — Telephone Encounter (Signed)
ZILRETTA for RIGHT knee OA OK to schedule on or after 02/14/23  Primary Insurance: Medicare Co-Pay: n/a Co-Insurance: 20% Deductible: $240 of $240 met  Prior Authorization: NOT required  Secondary Insurance: [employer] of [employer] Supplement Plan G Co-Pay: Covers Medicare Part B co-insurance Co-Insurance: Covers Medicare Part B co-insurance Deductible:  n/a  Prior Authorization: NOT required   Knee Injection History: 11/21/22 - Zilretta RIGHT

## 2023-01-25 NOTE — Telephone Encounter (Signed)
NO Prior Auth required for [employer]

## 2023-01-27 ENCOUNTER — Other Ambulatory Visit: Payer: Self-pay | Admitting: Family

## 2023-01-29 ENCOUNTER — Other Ambulatory Visit: Payer: Self-pay | Admitting: Internal Medicine

## 2023-01-29 MED ORDER — ROSUVASTATIN CALCIUM 5 MG PO TABS: 5.0000 mg | ORAL_TABLET | Freq: Every day | ORAL | 1 refills | Status: DC

## 2023-01-29 NOTE — Telephone Encounter (Signed)
Pt has not been seen by Dr. Tenny Craw since 2023. Pt has an upcoming appt in January 2025 with Dr. Tenny Craw. Would Dr. Tenny Craw like to refill this medication until appt time or does pt need to come in to see Dr. Tenny Craw sooner? Please address

## 2023-01-29 NOTE — Telephone Encounter (Signed)
*  STAT* If patient is at the pharmacy, call can be transferred to refill team.   1. Which medications need to be refilled? (please list name of each medication and dose if known)   rosuvastatin (CRESTOR) 5 MG tablet    2. Which pharmacy/location (including street and city if local pharmacy) is medication to be sent to?  [facility] - [place], [place] - [address]      3. Do they need a 30 day or 90 day supply? 90 day

## 2023-01-30 MED ORDER — ROSUVASTATIN CALCIUM 5 MG PO TABS: 5.0000 mg | ORAL_TABLET | Freq: Every day | ORAL | 1 refills | Status: DC

## 2023-02-01 ENCOUNTER — Encounter: Payer: Self-pay | Admitting: Family Medicine

## 2023-02-02 MED ORDER — METHOCARBAMOL 750 MG PO TABS: 750.0000 mg | ORAL_TABLET | Freq: Four times a day (QID) | ORAL | 1 refills | Status: DC | PRN

## 2023-02-06 ENCOUNTER — Other Ambulatory Visit: Payer: Self-pay

## 2023-02-06 DIAGNOSIS — G8929 Other chronic pain: Secondary | ICD-10-CM

## 2023-02-06 DIAGNOSIS — M545 Low back pain, unspecified: Secondary | ICD-10-CM

## 2023-02-08 ENCOUNTER — Other Ambulatory Visit: Payer: Self-pay | Admitting: Bariatrics

## 2023-02-08 DIAGNOSIS — R7303 Prediabetes: Secondary | ICD-10-CM

## 2023-02-09 HISTORY — PX: BACK SURGERY: SHX140

## 2023-02-12 ENCOUNTER — Ambulatory Visit (INDEPENDENT_AMBULATORY_CARE_PROVIDER_SITE_OTHER): Payer: Medicare Other | Admitting: Family

## 2023-02-12 ENCOUNTER — Other Ambulatory Visit (HOSPITAL_BASED_OUTPATIENT_CLINIC_OR_DEPARTMENT_OTHER): Payer: Self-pay

## 2023-02-12 VITALS — BP 122/75 | HR 77 | Temp 97.8°F | Resp 16 | Wt 247.0 lb

## 2023-02-12 DIAGNOSIS — R7303 Prediabetes: Secondary | ICD-10-CM

## 2023-02-12 DIAGNOSIS — Z23 Encounter for immunization: Secondary | ICD-10-CM | POA: Diagnosis not present

## 2023-02-12 DIAGNOSIS — H349 Unspecified retinal vascular occlusion: Secondary | ICD-10-CM

## 2023-02-12 DIAGNOSIS — E890 Postprocedural hypothyroidism: Secondary | ICD-10-CM

## 2023-02-12 DIAGNOSIS — Z6839 Body mass index (BMI) 39.0-39.9, adult: Secondary | ICD-10-CM

## 2023-02-12 DIAGNOSIS — B351 Tinea unguium: Secondary | ICD-10-CM

## 2023-02-12 DIAGNOSIS — Z85850 Personal history of malignant neoplasm of thyroid: Secondary | ICD-10-CM | POA: Insufficient documentation

## 2023-02-12 DIAGNOSIS — C73 Malignant neoplasm of thyroid gland: Secondary | ICD-10-CM

## 2023-02-12 DIAGNOSIS — I10 Essential (primary) hypertension: Secondary | ICD-10-CM

## 2023-02-12 DIAGNOSIS — J4540 Moderate persistent asthma, uncomplicated: Secondary | ICD-10-CM | POA: Diagnosis not present

## 2023-02-12 DIAGNOSIS — E782 Mixed hyperlipidemia: Secondary | ICD-10-CM

## 2023-02-12 DIAGNOSIS — G4733 Obstructive sleep apnea (adult) (pediatric): Secondary | ICD-10-CM | POA: Diagnosis not present

## 2023-02-12 DIAGNOSIS — D0512 Intraductal carcinoma in situ of left breast: Secondary | ICD-10-CM

## 2023-02-12 MED ORDER — COMIRNATY 30 MCG/0.3ML IM SUSY
PREFILLED_SYRINGE | INTRAMUSCULAR | 0 refills | Status: DC
  Filled 2023-02-12: qty 0.3, 1d supply, fill #0

## 2023-02-12 MED ORDER — SEMAGLUTIDE (2 MG/DOSE) 8 MG/3ML ~~LOC~~ SOPN
2.0000 mg | PEN_INJECTOR | SUBCUTANEOUS | 0 refills | Status: DC
Start: 2023-02-12 — End: 2023-03-01

## 2023-02-12 NOTE — Assessment & Plan Note (Addendum)
Continues on Metformin per endo and Semaglutide per weight loss clinic.  Will discuss with weight loss clinic, as I think we probably should discontinue metformin.  Wt Readings from Last 3 Encounters:  02/12/23 247 lb (112 kg)  01/17/23 249 lb (112.9 kg)  01/16/23 245 lb (111.1 kg)   Lab Results  Component Value Date   HGBA1C 5.7 (A) 10/03/2022    Michelle Kennedy is walking a lot since there is a treadmill at her hotel gym.

## 2023-02-12 NOTE — Progress Notes (Signed)
Subjective:     Patient ID: Michelle Kennedy, female    DOB: 09-02-1955, 67 y.o.   MRN: [id number]  Chief Complaint  Patient presents with   Hypertension    Here for follow up    Hypertension Pertinent negatives include no chest pain, headaches, neck pain or palpitations.    Discussed the use of AI scribe software for clinical note transcription with the patient, who gave verbal consent to proceed.   Patient is a 67 year old female that presents to the clinic for follow up for her blood pressure. Patient states that diet and exercise is going well for her. Doing well with the Amlodipine with no issues noted at this time. Denies headache, chest pain or palpations.   Flood in house during mothers day. So she is having to deal with trying to do construction on her house. She states she is trying to stay stress free as much as she can but she is trying to do as much exercise as she can.   She states that she is still having issues with her toenail on left foot. Completed the antifungal in May. She said the right foot cleared up with the fungus and on her left foot it cleared up and then came back.   Still doing well with diabetes and continue to take metformin with no issues. Has a monitor on her arm and she does monitor her blood sugars on a regular bases.       Health Maintenance Due  Topic Date Due   INFLUENZA VACCINE  12/21/2022   COVID-19 Vaccine (7 - 2023-24 season) 01/21/2023    Past Medical History:  Diagnosis Date   Abnormally small mouth    Achilles tendon disorder, right    Allergy    Anemia    Anxiety    Arthritis    hips and knees   Asthma    daily inhaler, prn inhaler and neb.   Asthma due to environmental allergies    Basal cell carcinoma    nose, s/p mohs.   Breast cancer (HCC) 01/2014   left   Cataract    Chest pain    CHF (congestive heart failure) (HCC)    COPD (chronic obstructive pulmonary disease) (HCC)    Dental bridge present    upper  front and lower right   Dental crowns present    x 3   Depression    Esophageal spasm    reports since her chemo and breast surgery  she developed esophageal spasms and reports this is in the past has casue her 02 to desat in the  70s , denies sycnope in relation to this , does report hx of vertigo as well    Family history of anesthesia complication    twin brother aspirated and died on OR table, per pt.   Fibromyalgia    Gallbladder disease    Gallstones    GERD (gastroesophageal reflux disease)    Glaucoma    H/O blood clots    had blood to  PICC line    History of gastric ulcer    as a teenager   History of seizure age 79   as a reaction to Penicillin - no seizures since   History of stomach ulcers    History of thyroid cancer    s/p thyroidectomy   HTN (hypertension)    Hyperlipidemia    Hypothyroidism    Joint pain    Left knee injury  Liver disorder    seen when she had her hysterectomy , reports she was told it was  " small lesion" ; but asymptomatic    Lymphedema    left arm   Migraines    Multiple food allergies    Obesity    OSA (obstructive sleep apnea) 02/16/2016   CPAP   Palpitations    reports no longer experiences   Personal history of chemotherapy 2015   Personal history of radiation therapy 2015   Left   Prediabetes    Rheumatoid arthritis (HCC)    Sleep apnea    SOB (shortness of breath)    Swelling of both ankles    Tinnitus    UTI (lower urinary tract infection) 02/17/2014   Vertigo    last episode  was 2 weeks ago    Vitamin D deficiency    Wears contact lenses    left eye only    Wears hearing aid in both ears     Past Surgical History:  Procedure Laterality Date   ABDOMINAL HYSTERECTOMY  2004   complete   ACHILLES TENDON REPAIR Right    APPENDECTOMY  2004   BREAST EXCISIONAL BIOPSY     BREAST LUMPECTOMY Left    2015   BREAST LUMPECTOMY WITH NEEDLE LOCALIZATION AND AXILLARY SENTINEL LYMPH NODE BX Left 02/23/2014   Procedure:  BREAST LUMPECTOMY WITH NEEDLE LOCALIZATION AND AXILLARY SENTINEL LYMPH NODE BIOPSY;  Surgeon: Glenna Fellows, MD;  Location: [facility];  Service: General;  Laterality: Left;   BREAST SURGERY  2011   left breast biposy   CHOLECYSTECTOMY  1990   COLONOSCOPY W/ POLYPECTOMY  06/2009   EYE SURGERY Right 2013   exc. warts from underneath eyelid   INCONTINENCE SURGERY  2004   KNEE ARTHROSCOPY Bilateral    x 6 each knee   LIGAMENT REPAIR Right    thumb/wrist   ORIF TOE FRACTURE Right    great toe   PORTACATH PLACEMENT N/A 03/24/2014   Procedure: INSERTION PORT-A-CATH;  Surgeon: Glenna Fellows, MD;  Location: [facility];  Service: General;  Laterality: N/A;; removed    THYROIDECTOMY  2000   TONSILLECTOMY AND ADENOIDECTOMY  2000   TOTAL KNEE ARTHROPLASTY Left 12/03/2017   Procedure: LEFT TOTAL KNEE ARTHROPLASTY;  Surgeon: Ollen Gross, MD;  Location: [facility];  Service: Orthopedics;  Laterality: Left;   TUBAL LIGATION  1981   TUMOR EXCISION     from thoracic spine    Family History  Problem Relation Age of Onset   Alcohol abuse Mother    Arthritis Mother    Hypertension Mother    Bipolar disorder Mother    Breast cancer Mother 36       unconfirmed   Lung cancer Mother 34       smoker   Hyperlipidemia Mother    Stroke Mother    Cancer Mother    Depression Mother    Anxiety disorder Mother    Alcoholism Mother    Drug abuse Mother    Eating disorder Mother    Obesity Mother    Alcohol abuse Father    Hyperlipidemia Father    Kidney disease Father    Diabetes Father    Hypertension Father    Cystic kidney disease Father    Thyroid disease Father    Liver disease Father    Alcoholism Father    Lung cancer Father    Thyroid cancer Sister 101  type?; currently 67   Other Sister        ovarian tumor @ 75; TAH/BSO   Breast cancer Sister    Thyroid cancer Brother        dx 30s; currently 20   Breast cancer Maternal Aunt        dx  39s; deceased 36   Thyroid cancer Paternal Aunt        All 3 paternal aunts with thyroid ca in 30s/40s   Lung cancer Paternal Aunt        2 of 3 paternal aunts with lung cancer   Arthritis Maternal Grandmother    Diabetes Paternal Grandmother    Heart disease Other    COPD Other    Asthma Other     Social History   Socioeconomic History   Marital status: Married    Spouse name: Dayton Scrape   Number of children: 2   Years of education: Not on file   Highest education level: Associate degree: occupational, Scientist, product/process development, or vocational program  Occupational History   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   Social Determinants of Health   Financial Resource Strain: Low Risk  (02/12/2023)   Overall Financial Resource Strain (CARDIA)    Difficulty of Paying Living Expenses: Not hard at all  Food Insecurity: No Food Insecurity (02/12/2023)   Hunger Vital Sign    Worried About Running Out of Food in the Last Year: Never true    Ran Out of Food in the Last Year: Never true  Transportation Needs: No Transportation Needs (02/12/2023)   PRAPARE - Administrator, Civil Service (Medical): No    Lack of Transportation (Non-Medical): No  Physical Activity: Sufficiently Active (02/12/2023)   Exercise Vital Sign    Days of Exercise per Week: 4 days    Minutes of Exercise per Session: 40 min  Stress: Stress Concern Present (02/12/2023)   [employer] of Occupational Health -  Occupational Stress Questionnaire    Feeling of Stress : To some extent  Social Connections: Socially Integrated (02/12/2023)   Social Connection and Isolation Panel [NHANES]    Frequency of Communication with Friends and Family: Three times a week    Frequency of Social Gatherings with Friends and Family: Three times a week    Attends Religious Services: More than 4 times per year    Active Member of Clubs or Organizations: Yes    Attends Banker Meetings: More than 4 times per year    Marital Status: Married  Catering manager Violence: Not At Risk (08/24/2022)   Humiliation, Afraid, Rape, and Kick questionnaire    Fear of Current or Ex-Partner: No    Emotionally Abused: No    Physically Abused: No    Sexually Abused: No    Outpatient Medications Prior to Visit  Medication Sig Dispense Refill  albuterol (PROVENTIL) (2.5 MG/3ML) 0.083% nebulizer solution Take 3 mLs (2.5 mg total) by nebulization every 6 (six) hours as needed for wheezing or shortness of breath. 75 mL 5   albuterol (VENTOLIN HFA) 108 (90 Base) MCG/ACT inhaler USE 2 INHALATIONS ORALLY   EVERY 6 HOURS AS NEEDED FORWHEEZING OR SHORTNESS OF   BREATH. 54 g 1   amLODipine (NORVASC) 5 MG tablet TAKE 1 TABLET EVERY DAY 90 tablet 1   betamethasone valerate ointment (VALISONE) 0.1 % Apply 1 Application topically 2 (two) times daily. 30 g 2   CALCIUM PO Take 1 tablet by mouth every evening.     cholecalciferol (VITAMIN D3) 25 MCG (1000 UNIT) tablet Take 1,000 Units by mouth every evening.      clopidogrel (PLAVIX) 75 MG tablet TAKE 1 TABLET EVERY DAY 90 tablet 3   EPINEPHrine 0.3 mg/0.3 mL IJ SOAJ injection Inject 0.3 mg into the muscle as needed for anaphylaxis. 2 each 1   fluticasone-salmeterol (WIXELA INHUB) 500-50 MCG/ACT AEPB Inhale 1 puff into the lungs in the morning and at bedtime. 60 each 2   furosemide (LASIX) 20 MG tablet TAKE 1 TABLET(20 MG) BY MOUTH DAILY AS NEEDED FOR SWELLING 30 tablet 3   gabapentin  (NEURONTIN) 100 MG capsule TAKE 2 CAPSULES THREE TIMES DAILY 540 capsule 3   latanoprost (XALATAN) 0.005 % ophthalmic solution 1 drop at bedtime.     levothyroxine (SYNTHROID) 150 MCG tablet Take 1 tablet (150 mcg total) by mouth daily. 90 tablet 3   lisinopril (ZESTRIL) 20 MG tablet TAKE 2 TABLETS EVERY DAY 180 tablet 1   meclizine (ANTIVERT) 25 MG tablet Take 1 tablet (25 mg total) by mouth 3 (three) times daily as needed for dizziness. 30 tablet 0   metFORMIN (GLUCOPHAGE) 500 MG tablet TAKE 1 TABLET EVERY DAY WITH BREAKFAST 90 tablet 3   methocarbamol (ROBAXIN) 750 MG tablet Take 1 tablet (750 mg total) by mouth every 6 (six) hours as needed for muscle spasms. 90 tablet 1   montelukast (SINGULAIR) 10 MG tablet Take 1 tablet (10 mg total) by mouth at bedtime. 90 tablet 1   Multiple Vitamin (MULTIVITAMIN ADULT) TABS Take 1 tablet by mouth daily.     Multiple Vitamins-Minerals (OCUVITE PO) Take 1 tablet by mouth daily.     nirmatrelvir & ritonavir (PAXLOVID, 300/100,) 20 x 150 MG & 10 x 100MG  TBPK Take per package instructions 30 tablet 0   Omega-3 Fatty Acids (FISH OIL) 1000 MG CAPS Take 1 capsule by mouth every evening.      ondansetron (ZOFRAN) 4 MG tablet Take 1 tablet (4 mg total) by mouth every 8 (eight) hours as needed for nausea or vomiting. 20 tablet 0   pantoprazole (PROTONIX) 40 MG tablet TAKE 1 TABLET EVERY DAY 90 tablet 3   rosuvastatin (CRESTOR) 5 MG tablet Take 1 tablet (5 mg total) by mouth daily. 90 tablet 1   Semaglutide, 2 MG/DOSE, 8 MG/3ML SOPN Inject 2 mg as directed once a week. 3 mL 0   terbinafine (LAMISIL) 250 MG tablet Take 1 tablet (250 mg total) by mouth daily. 60 tablet 0   traMADol (ULTRAM) 50 MG tablet Take 1 tablet (50 mg total) by mouth every 6 (six) hours as needed for moderate pain or severe pain. 20 tablet 0   No facility-administered medications prior to visit.    Allergies  Allergen Reactions   Bee Venom Anaphylaxis   Betadine [Povidone Iodine]  Anaphylaxis    Rash. Breathing problems.  Codeine Shortness Of Breath and Rash   Contrast Media [Iodinated Contrast Media] Shortness Of Breath   Gadolinium Derivatives Hives, Rash, Shortness Of Breath and Swelling    "my heart stopped beating"    Iodine Other (See Comments)    CARDIAC ARREST   Latex Anaphylaxis and Rash   Lidocaine Shortness Of Breath and Swelling    SWELLING OF MOUTH AND THROAT, low BP   Penicillins Shortness Of Breath, Rash and Other (See Comments)    SEIZURE Did it involve swelling of the face/tongue/throat, SOB, or low BP? no Did it involve sudden or severe rash/hives, skin peeling, or any reaction on the inside of your mouth or nose? yes Did you need to seek medical attention at a hospital or doctor's office? yes When did it last happen?      2010 If all above answers are "NO", may proceed with cephalosporin use.    Pentazocine Lactate Shortness Of Breath and Rash   Shellfish Allergy Shortness Of Breath and Rash   Aspirin Rash and Other (See Comments)    GI UPSET   Erythromycin Swelling and Rash    SWELLING OF JOINTS   Symbicort [Budesonide-Formoterol Fumarate] Other (See Comments)    BURNING OF TONGUE AND LIPS   Compazine [Prochlorperazine Maleate] Rash    Rash on face,chest, arms, back   Sulfonamide Derivatives Rash    Review of Systems  Constitutional:  Positive for weight loss. Negative for chills.  HENT:  Negative for hearing loss, nosebleeds, sore throat and tinnitus.   Respiratory:  Negative for cough and wheezing.   Cardiovascular:  Negative for chest pain and palpitations.  Gastrointestinal:  Negative for nausea and vomiting.  Genitourinary:  Negative for frequency.  Musculoskeletal:  Negative for back pain and neck pain.  Neurological:  Negative for dizziness and headaches.  Psychiatric/Behavioral:  Negative for depression and suicidal ideas.        Objective:    Physical Exam Constitutional:      Appearance: Normal appearance. She  is normal weight.  HENT:     Head: Normocephalic.  Cardiovascular:     Rate and Rhythm: Normal rate and regular rhythm.  Pulmonary:     Effort: Pulmonary effort is normal.  Musculoskeletal:        General: Normal range of motion.     Cervical back: Normal range of motion.  Skin:    General: Skin is warm.     Capillary Refill: Capillary refill takes less than 2 seconds.  Neurological:     General: No focal deficit present.     Mental Status: She is alert and oriented to person, place, and time. Mental status is at baseline.  Psychiatric:        Mood and Affect: Mood normal.        Behavior: Behavior normal.        Thought Content: Thought content normal.        Judgment: Judgment normal.      BP 122/75 (BP Location: Right Arm, Patient Position: Sitting, Cuff Size: Large)   Pulse 77   Temp 97.8 F (36.6 C) (Oral)   Resp 16   Wt 247 lb (112 kg)   SpO2 99%   BMI 39.87 kg/m  Wt Readings from Last 3 Encounters:  02/12/23 247 lb (112 kg)  01/17/23 249 lb (112.9 kg)  01/16/23 245 lb (111.1 kg)       Assessment & Plan:   Problem List Items Addressed This Visit   None  I am having Ngoc [employer]" maintain her CALCIUM PO, cholecalciferol, Fish Oil, Multivitamin Adult, Multiple Vitamins-Minerals (OCUVITE PO), latanoprost, furosemide, albuterol, EPINEPHrine, meclizine, gabapentin, betamethasone valerate ointment, terbinafine, fluticasone-salmeterol, amLODipine, pantoprazole, levothyroxine, albuterol, metFORMIN, ondansetron, montelukast, clopidogrel, Paxlovid (300/100), Semaglutide (2 MG/DOSE), traMADol, lisinopril, rosuvastatin, and methocarbamol.  No orders of the defined types were placed in this encounter.

## 2023-02-12 NOTE — Assessment & Plan Note (Signed)
Continue secondary prevention with plavix, statin, bp and glucose control.
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
## 2023-02-12 NOTE — Assessment & Plan Note (Addendum)
Wt Readings from Last 3 Encounters:  02/12/23 247 lb (112 kg)  01/17/23 249 lb (112.9 kg)  01/16/23 245 lb (111.1 kg)   Continues Semaglutide, exercise and weight loss efforts.

## 2023-02-12 NOTE — Assessment & Plan Note (Addendum)
S/p thyroidectomy 2000.
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
## 2023-02-12 NOTE — Assessment & Plan Note (Signed)
Lab Results  Component Value Date   CHOL 170 07/06/2022   HDL 73 07/06/2022   LDLCALC 81 07/06/2022   LDLDIRECT 117.0 02/26/2019   TRIG 85 07/06/2022   CHOLHDL 3 06/28/2020  Stable lipids, continue crestor 5 mg.

## 2023-02-12 NOTE — Patient Instructions (Signed)
VISIT SUMMARY:  During your visit, we discussed your ongoing toenail issue, fluctuations in your blood sugar levels, and your partner's behavior changes following their testosterone shots. We also reviewed your well-controlled hypertension, diabetes, hyperlipidemia, and asthma.  YOUR PLAN:  -TOENAIL ISSUE: Your toenail issue, known as onychomycosis, is a fungal infection that causes discoloration and thickening of the nails. Despite treatment, it has not fully resolved. I am referring you to a podiatrist for further management.  -HYPERTENSION: Your high blood pressure is well controlled with lifestyle modifications. Please continue with your current regimen.  -DIABETES: You've reported low blood sugar levels at night. Continue taking Metformin and attend a diabetes education seminar. You also have a follow-up appointment with an endocrinologist in October.  -HYPERLIPIDEMIA: Hyperlipidemia is a condition where there are high levels of fats in the blood. Your last cholesterol check showed good control on Crestor 5mg . Please continue with your current regimen.  -ASTHMA: Your asthma is well controlled despite your current living conditions. Please continue with your current regimen.  -GENERAL HEALTH MAINTENANCE: For your general health, I administered the influenza vaccine today. Please obtain the COVID-19 vaccine at a pharmacy and I have ordered updated labs for you.  INSTRUCTIONS:  Please make sure to schedule and attend your appointment with the podiatrist for your toenail issue. Continue with your current regimen for hypertension, diabetes, hyperlipidemia, and asthma. Attend a diabetes education seminar and follow up with your endocrinologist in October. Get the COVID-19 vaccine at a pharmacy and complete the updated labs that I have ordered.

## 2023-02-12 NOTE — Assessment & Plan Note (Addendum)
Normal mammogram 01/24/23.  Follows with oncology- Dr. Carollee Massed

## 2023-02-12 NOTE — Assessment & Plan Note (Signed)
Continue with CPAP

## 2023-02-12 NOTE — Assessment & Plan Note (Signed)
At goal range. Continue on Amlodipine at this time

## 2023-02-12 NOTE — Assessment & Plan Note (Addendum)
Stable. Continue singulair, wixela and prn albuterol.

## 2023-02-13 ENCOUNTER — Ambulatory Visit: Payer: Medicare Other | Attending: Family Medicine | Admitting: Physical Therapy

## 2023-02-13 ENCOUNTER — Other Ambulatory Visit: Payer: Self-pay

## 2023-02-13 ENCOUNTER — Encounter: Payer: Self-pay | Admitting: Physical Therapy

## 2023-02-13 DIAGNOSIS — M6281 Muscle weakness (generalized): Secondary | ICD-10-CM | POA: Diagnosis not present

## 2023-02-13 DIAGNOSIS — R252 Cramp and spasm: Secondary | ICD-10-CM

## 2023-02-13 DIAGNOSIS — M533 Sacrococcygeal disorders, not elsewhere classified: Secondary | ICD-10-CM | POA: Diagnosis not present

## 2023-02-13 DIAGNOSIS — M545 Low back pain, unspecified: Secondary | ICD-10-CM | POA: Insufficient documentation

## 2023-02-13 DIAGNOSIS — G8929 Other chronic pain: Secondary | ICD-10-CM | POA: Diagnosis not present
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
## 2023-02-15 ENCOUNTER — Encounter: Payer: Self-pay | Admitting: Physical Therapy

## 2023-02-15 ENCOUNTER — Ambulatory Visit: Payer: Medicare Other | Admitting: Physical Therapy

## 2023-02-15 DIAGNOSIS — M545 Low back pain, unspecified: Secondary | ICD-10-CM

## 2023-02-15 DIAGNOSIS — M6281 Muscle weakness (generalized): Secondary | ICD-10-CM

## 2023-02-15 DIAGNOSIS — R252 Cramp and spasm: Secondary | ICD-10-CM

## 2023-02-18 ENCOUNTER — Telehealth: Payer: Self-pay | Admitting: Family

## 2023-02-18 NOTE — Telephone Encounter (Signed)
Please contact pt to schedule a lab visit.  It looks like she forgot to stop by after her last visit.

## 2023-02-19 NOTE — Telephone Encounter (Signed)
[email or web address] sched  

## 2023-02-20 ENCOUNTER — Encounter: Payer: Self-pay | Admitting: Physical Therapy

## 2023-02-20 ENCOUNTER — Ambulatory Visit: Payer: Medicare Other | Attending: Family Medicine | Admitting: Physical Therapy

## 2023-02-20 DIAGNOSIS — M6281 Muscle weakness (generalized): Secondary | ICD-10-CM | POA: Insufficient documentation

## 2023-02-20 DIAGNOSIS — M25561 Pain in right knee: Secondary | ICD-10-CM | POA: Diagnosis present

## 2023-02-20 DIAGNOSIS — M25572 Pain in left ankle and joints of left foot: Secondary | ICD-10-CM | POA: Diagnosis present

## 2023-02-20 DIAGNOSIS — R252 Cramp and spasm: Secondary | ICD-10-CM | POA: Diagnosis present

## 2023-02-20 DIAGNOSIS — G8929 Other chronic pain: Secondary | ICD-10-CM | POA: Diagnosis present

## 2023-02-20 DIAGNOSIS — M545 Low back pain, unspecified: Secondary | ICD-10-CM | POA: Insufficient documentation
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
## 2023-02-21 ENCOUNTER — Ambulatory Visit (INDEPENDENT_AMBULATORY_CARE_PROVIDER_SITE_OTHER): Payer: Medicare Other | Admitting: Podiatry

## 2023-02-21 ENCOUNTER — Other Ambulatory Visit: Payer: Self-pay | Admitting: Podiatry

## 2023-02-21 ENCOUNTER — Other Ambulatory Visit (INDEPENDENT_AMBULATORY_CARE_PROVIDER_SITE_OTHER): Payer: Medicare Other

## 2023-02-21 DIAGNOSIS — B351 Tinea unguium: Secondary | ICD-10-CM | POA: Diagnosis not present

## 2023-02-21 DIAGNOSIS — R7303 Prediabetes: Secondary | ICD-10-CM

## 2023-02-21 DIAGNOSIS — E890 Postprocedural hypothyroidism: Secondary | ICD-10-CM

## 2023-02-21 DIAGNOSIS — Z79899 Other long term (current) drug therapy: Secondary | ICD-10-CM | POA: Diagnosis not present

## 2023-02-21 NOTE — Addendum Note (Signed)
Addended by: Mervin Kung A on: 02/21/2023 02:59 PM   Modules accepted: Orders

## 2023-02-21 NOTE — Progress Notes (Signed)
Subjective:  Patient ID: Michelle Kennedy, female    DOB: [birth year],  MRN: [id number]  Chief Complaint  Patient presents with   Nail Problem    Nail fungus     67 y.o. female presents with the above complaint.  Patient presents with bilateral hallux thickened elongated dystrophic mycotic nail x 2.  Patient states that has been present for quite some time she would like to discuss treatment options for appreciate tried over-the-counter option which has helped.  She would like to discuss oral medication.   Review of Systems: Negative except as noted in the HPI. Denies N/V/F/Ch.  Past Medical History:  Diagnosis Date   Abnormally small mouth    Achilles tendon disorder, right    Allergy    Anemia    Anxiety    Arthritis    hips and knees   Asthma    daily inhaler, prn inhaler and neb.   Asthma due to environmental allergies    Basal cell carcinoma    nose, s/p mohs.   Breast cancer (HCC) 01/2014   left   Cataract    Chest pain    CHF (congestive heart failure) (HCC)    COPD (chronic obstructive pulmonary disease) (HCC)    Dental bridge present    upper front and lower right   Dental crowns present    x 3   Depression    Esophageal spasm    reports since her chemo and breast surgery  she developed esophageal spasms and reports this is in the past has casue her 02 to desat in the  70s , denies sycnope in relation to this , does report hx of vertigo as well    Family history of anesthesia complication    twin brother aspirated and died on OR table, per pt.   Fibromyalgia    Gallbladder disease    Gallstones    GERD (gastroesophageal reflux disease)    Glaucoma    H/O blood clots    had blood to  PICC line    History of gastric ulcer    as a teenager   History of seizure age 5   as a reaction to Penicillin - no seizures since   History of stomach ulcers    History of thyroid cancer    s/p thyroidectomy   History of thyroid cancer    HTN (hypertension)     Hyperlipidemia    Hypothyroidism    Joint pain    Left knee injury    Liver disorder    seen when she had her hysterectomy , reports she was told it was  " small lesion" ; but asymptomatic    Lymphedema    left arm   Migraines    Multiple food allergies    Obesity    OSA (obstructive sleep apnea) 02/16/2016   CPAP   Palpitations    reports no longer experiences   Personal history of chemotherapy 2015   Personal history of radiation therapy 2015   Left   Prediabetes    Rheumatoid arthritis (HCC)    Sleep apnea    SOB (shortness of breath)    Swelling of both ankles    Tinnitus    UTI (lower urinary tract infection) 02/17/2014   Vertigo    last episode  was 2 weeks ago    Vitamin D deficiency    Wears contact lenses    left eye only    Wears hearing aid in both ears  Current Outpatient Medications:    albuterol (PROVENTIL) (2.5 MG/3ML) 0.083% nebulizer solution, Take 3 mLs (2.5 mg total) by nebulization every 6 (six) hours as needed for wheezing or shortness of breath., Disp: 75 mL, Rfl: 5   albuterol (VENTOLIN HFA) 108 (90 Base) MCG/ACT inhaler, USE 2 INHALATIONS ORALLY   EVERY 6 HOURS AS NEEDED FORWHEEZING OR SHORTNESS OF   BREATH., Disp: 54 g, Rfl: 1   amLODipine (NORVASC) 5 MG tablet, TAKE 1 TABLET EVERY DAY, Disp: 90 tablet, Rfl: 1   betamethasone valerate ointment (VALISONE) 0.1 %, Apply 1 Application topically 2 (two) times daily., Disp: 30 g, Rfl: 2   CALCIUM PO, Take 1 tablet by mouth every evening., Disp: , Rfl:    cholecalciferol (VITAMIN D3) 25 MCG (1000 UNIT) tablet, Take 1,000 Units by mouth every evening. , Disp: , Rfl:    clopidogrel (PLAVIX) 75 MG tablet, TAKE 1 TABLET EVERY DAY, Disp: 90 tablet, Rfl: 3   COVID-19 mRNA vaccine, Pfizer, (COMIRNATY) syringe, Inject into the muscle., Disp: 0.3 mL, Rfl: 0   EPINEPHrine 0.3 mg/0.3 mL IJ SOAJ injection, Inject 0.3 mg into the muscle as needed for anaphylaxis., Disp: 2 each, Rfl: 1   fluticasone-salmeterol  (WIXELA INHUB) 500-50 MCG/ACT AEPB, Inhale 1 puff into the lungs in the morning and at bedtime., Disp: 60 each, Rfl: 2   furosemide (LASIX) 20 MG tablet, TAKE 1 TABLET(20 MG) BY MOUTH DAILY AS NEEDED FOR SWELLING, Disp: 30 tablet, Rfl: 3   gabapentin (NEURONTIN) 100 MG capsule, TAKE 2 CAPSULES THREE TIMES DAILY, Disp: 540 capsule, Rfl: 3   latanoprost (XALATAN) 0.005 % ophthalmic solution, 1 drop at bedtime., Disp: , Rfl:    levothyroxine (SYNTHROID) 150 MCG tablet, Take 1 tablet (150 mcg total) by mouth daily., Disp: 90 tablet, Rfl: 3   lisinopril (ZESTRIL) 20 MG tablet, TAKE 2 TABLETS EVERY DAY, Disp: 180 tablet, Rfl: 1   meclizine (ANTIVERT) 25 MG tablet, Take 1 tablet (25 mg total) by mouth 3 (three) times daily as needed for dizziness., Disp: 30 tablet, Rfl: 0   metFORMIN (GLUCOPHAGE) 500 MG tablet, TAKE 1 TABLET EVERY DAY WITH BREAKFAST, Disp: 90 tablet, Rfl: 3   methocarbamol (ROBAXIN) 750 MG tablet, Take 1 tablet (750 mg total) by mouth every 6 (six) hours as needed for muscle spasms., Disp: 90 tablet, Rfl: 1   montelukast (SINGULAIR) 10 MG tablet, Take 1 tablet (10 mg total) by mouth at bedtime., Disp: 90 tablet, Rfl: 1   Multiple Vitamin (MULTIVITAMIN ADULT) TABS, Take 1 tablet by mouth daily., Disp: , Rfl:    Multiple Vitamins-Minerals (OCUVITE PO), Take 1 tablet by mouth daily., Disp: , Rfl:    Omega-3 Fatty Acids (FISH OIL) 1000 MG CAPS, Take 1 capsule by mouth every evening. , Disp: , Rfl:    ondansetron (ZOFRAN) 4 MG tablet, Take 1 tablet (4 mg total) by mouth every 8 (eight) hours as needed for nausea or vomiting., Disp: 20 tablet, Rfl: 0   pantoprazole (PROTONIX) 40 MG tablet, TAKE 1 TABLET EVERY DAY, Disp: 90 tablet, Rfl: 3   rosuvastatin (CRESTOR) 5 MG tablet, Take 1 tablet (5 mg total) by mouth daily., Disp: 90 tablet, Rfl: 1   Semaglutide, 2 MG/DOSE, 8 MG/3ML SOPN, Inject 2 mg as directed once a week., Disp: 3 mL, Rfl: 0   terbinafine (LAMISIL) 250 MG tablet, Take 1 tablet (250  mg total) by mouth daily., Disp: 90 tablet, Rfl: 0   traMADol (ULTRAM) 50 MG tablet, Take 1 tablet (50  mg total) by mouth every 6 (six) hours as needed for moderate pain or severe pain., Disp: 20 tablet, Rfl: 0  Social History   Tobacco Use  Smoking Status Never  Smokeless Tobacco Never    Allergies  Allergen Reactions   Bee Venom Anaphylaxis   Betadine [Povidone Iodine] Anaphylaxis    Rash. Breathing problems.    Codeine Shortness Of Breath and Rash   Contrast Media [Iodinated Contrast Media] Shortness Of Breath   Gadolinium Derivatives Hives, Rash, Shortness Of Breath and Swelling    "my heart stopped beating"    Iodine Other (See Comments)    CARDIAC ARREST   Latex Anaphylaxis and Rash   Lidocaine Shortness Of Breath and Swelling    SWELLING OF MOUTH AND THROAT, low BP   Penicillins Shortness Of Breath, Rash and Other (See Comments)    SEIZURE Did it involve swelling of the face/tongue/throat, SOB, or low BP? no Did it involve sudden or severe rash/hives, skin peeling, or any reaction on the inside of your mouth or nose? yes Did you need to seek medical attention at a hospital or doctor's office? yes When did it last happen?      2010 If all above answers are "NO", may proceed with cephalosporin use.    Pentazocine Lactate Shortness Of Breath and Rash   Shellfish Allergy Shortness Of Breath and Rash   Aspirin Rash and Other (See Comments)    GI UPSET   Erythromycin Swelling and Rash    SWELLING OF JOINTS   Symbicort [Budesonide-Formoterol Fumarate] Other (See Comments)    BURNING OF TONGUE AND LIPS   Compazine [Prochlorperazine Maleate] Rash    Rash on face,chest, arms, back   Sulfonamide Derivatives Rash   Objective:  There were no vitals filed for this visit. There is no height or weight on file to calculate BMI. Constitutional Well developed. Well nourished.  Vascular Dorsalis pedis pulses palpable bilaterally. Posterior tibial pulses palpable  bilaterally. Capillary refill normal to all digits.  No cyanosis or clubbing noted. Pedal hair growth normal.  Neurologic Normal speech. Oriented to person, place, and time. Epicritic sensation to light touch grossly present bilaterally.  Dermatologic Nails thickened elongated dystrophic mycotic toenails x 2 bilateral hallux Skin within normal limits  Orthopedic: Normal joint ROM without pain or crepitus bilaterally. No visible deformities. No bony tenderness.   Radiographs: None Assessment:   1. Long-term use of high-risk medication   2. Nail fungus   3. Onychomycosis due to dermatophyte    Plan:  Patient was evaluated and treated and all questions answered.  Bilateral hallux onychomycosis -Educated the patient on the etiology of onychomycosis and various treatment options associated with improving the fungal load.  I explained to the patient that there is 3 treatment options available to treat the onychomycosis including topical, p.o., laser treatment.  Patient elected to undergo p.o. options with Lamisil/terbinafine therapy.  In order for me to start the medication therapy, I explained to the patient the importance of evaluating the liver and obtaining the liver function test.  Once the liver function test comes back normal I will start him on 66-month course of Lamisil therapy.  Patient understood all risk and would like to proceed with Lamisil therapy.  I have asked the patient to immediately stop the Lamisil therapy if she has any reactions to it and call the office or go to the emergency room right away.  Patient states understanding   No follow-ups on file.

## 2023-02-22 LAB — HEPATIC FUNCTION PANEL
ALT: 27 [IU]/L (ref 0–32)
AST: 17 [IU]/L (ref 0–40)
Albumin: 4 g/dL (ref 3.9–4.9)
Alkaline Phosphatase: 66 [IU]/L (ref 44–121)
Bilirubin Total: 0.2 mg/dL (ref 0.0–1.2)
Bilirubin, Direct: <0.1 mg/dL (ref 0.00–0.40)
Total Protein: 6.3 g/dL (ref 6.0–8.5)

## 2023-02-22 LAB — TSH: TSH: 1.23 u[IU]/mL (ref 0.35–5.50)

## 2023-02-22 LAB — BASIC METABOLIC PANEL
BUN: 13 mg/dL (ref 6–23)
CO2: 27 meq/L (ref 19–32)
Calcium: 9.4 mg/dL (ref 8.4–10.5)
Chloride: 105 meq/L (ref 96–112)
Creatinine, Ser: 0.76 mg/dL (ref 0.40–1.20)
GFR: 80.37 mL/min (ref >60.00)
Glucose, Bld: 94 mg/dL (ref 70–99)
Potassium: 4.3 meq/L (ref 3.5–5.1)
Sodium: 140 meq/L (ref 135–145)

## 2023-02-22 LAB — HEMOGLOBIN A1C: Hgb A1c MFr Bld: 5.9 % (ref 4.6–6.5)

## 2023-02-22 MED ORDER — TERBINAFINE HCL 250 MG PO TABS: 250.0000 mg | ORAL_TABLET | Freq: Every day | ORAL | 0 refills | Status: DC

## 2023-02-22 NOTE — Addendum Note (Signed)
Addended by: Nicholes Rough on: 02/22/2023 03:19 PM   Modules accepted: Orders
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
## 2023-02-23 ENCOUNTER — Encounter: Payer: Self-pay | Admitting: Physical Therapy

## 2023-02-23 ENCOUNTER — Ambulatory Visit: Payer: Medicare Other | Admitting: Physical Therapy

## 2023-02-23 DIAGNOSIS — M545 Low back pain, unspecified: Secondary | ICD-10-CM

## 2023-02-23 DIAGNOSIS — R252 Cramp and spasm: Secondary | ICD-10-CM

## 2023-02-23 DIAGNOSIS — M6281 Muscle weakness (generalized): Secondary | ICD-10-CM

## 2023-02-27 ENCOUNTER — Ambulatory Visit: Payer: Medicare Other | Admitting: Physical Therapy

## 2023-02-27 ENCOUNTER — Encounter: Payer: Self-pay | Admitting: Physical Therapy

## 2023-02-27 DIAGNOSIS — M545 Low back pain, unspecified: Secondary | ICD-10-CM | POA: Diagnosis not present

## 2023-02-27 DIAGNOSIS — M6281 Muscle weakness (generalized): Secondary | ICD-10-CM

## 2023-02-27 DIAGNOSIS — R252 Cramp and spasm: Secondary | ICD-10-CM

## 2023-02-27 DIAGNOSIS — G8929 Other chronic pain: Secondary | ICD-10-CM
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
## 2023-03-01 ENCOUNTER — Ambulatory Visit: Payer: Medicare Other | Admitting: Bariatrics

## 2023-03-01 ENCOUNTER — Encounter: Payer: Self-pay | Admitting: Bariatrics

## 2023-03-01 VITALS — BP 126/72 | HR 61 | Temp 97.6°F | Ht 66.0 in | Wt 248.0 lb

## 2023-03-01 DIAGNOSIS — E66813 Morbid (severe) obesity due to excess calories: Secondary | ICD-10-CM

## 2023-03-01 DIAGNOSIS — R7303 Prediabetes: Secondary | ICD-10-CM | POA: Diagnosis not present

## 2023-03-01 DIAGNOSIS — Z6841 Body Mass Index (BMI) 40.0 and over, adult: Secondary | ICD-10-CM | POA: Diagnosis not present

## 2023-03-01 DIAGNOSIS — I10 Essential (primary) hypertension: Secondary | ICD-10-CM | POA: Diagnosis not present

## 2023-03-01 MED ORDER — SEMAGLUTIDE (2 MG/DOSE) 8 MG/3ML ~~LOC~~ SOPN
2.0000 mg | PEN_INJECTOR | SUBCUTANEOUS | 0 refills | Status: DC
Start: 2023-03-01 — End: 2023-04-12
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
## 2023-03-01 NOTE — Progress Notes (Signed)
WEIGHT SUMMARY AND BIOMETRICS  Weight Lost Since Last Visit: 0  Weight Gained Since Last Visit: 3lb   Vitals Temp: 97.6 F (36.4 C) BP: 126/72 Pulse Rate: 61 SpO2: 98 %   Anthropometric Measurements Height: 5\' 6"  (1.676 m) Weight: 248 lb (112.5 kg) BMI (Calculated): 40.05 Weight at Last Visit: 245lb Weight Lost Since Last Visit: 0 Weight Gained Since Last Visit: 3lb Starting Weight: 275lb Total Weight Loss (lbs): 27 lb (12.2 kg)   Body Composition  Body Fat %: 50.5 % Fat Mass (lbs): 125.4 lbs Muscle Mass (lbs): 116.6 lbs Total Body Water (lbs): 92.4 lbs Visceral Fat Rating : 17   Other Clinical Data Fasting: no Labs: no Today's Visit #: 12 Starting Date: 07/28/21    OBESITY Michelle Kennedy is here to discuss her progress with her obesity treatment plan along with follow-up of her obesity related diagnoses.     Nutrition Plan: keeping a food journal with goal of 1600 calories and 90 grams of protein daily - 60% adherence.  Current exercise: aerobics and Physical therapy  Interim History:  She is up 3 lbs since her last visit.  Eating all of the food on the plan., Protein intake is as prescribed, Is not skipping meals, Water intake is inadequate., and Denies polyphagia  Pharmacotherapy: Michelle Kennedy is on Ozempic 2 mg SQ weekly Adverse side effects: None Hunger is moderately controlled.  Cravings are moderately controlled.  Assessment/Plan:   1. Prediabetes Prediabetes Last A1c was 5.9  Medication(s): Ozemp Ozempic 2 mg SQ weekly Lab Results  Component Value Date   HGBA1C 5.9 02/21/2023   HGBA1C 5.7 (A) 10/03/2022   HGBA1C 5.7 (H) 07/06/2022   HGBA1C 5.7 (A) 02/02/2022   HGBA1C 5.6 10/28/2021   Lab Results  Component Value Date   INSULIN 18.0 07/06/2022   INSULIN 25.6 (H) 07/28/2021   INSULIN 21.8 09/20/2016    Plan: Information sheet on " Insulin Resistance and Prediabetes".  Will minimize all refined carbohydrates both sweets and  starches.  Will work on the plan and exercise.  Consider both aerobic and resistance training.  Will keep protein, water, and fiber intake high.  Increase Polyunsaturated and Monounsaturated fats to increase satiety and encourage weight loss.  Aim for 7 to 9 hours of sleep nightly.  Will continue medications.  Continue and refill Ozempic 2 mg SQ weekly   Hypertension Hypertension well controlled.  Medication(s): Amlodipine 5 mg 1 daily  and Lisinopril 20 mg  BP Readings from Last 3 Encounters:  03/01/23 126/72  02/12/23 122/75  01/17/23 138/84   Lab Results  Component Value Date   CREATININE 0.76 02/21/2023   CREATININE 0.95 07/06/2022   CREATININE 0.75 10/28/2021   Lab Results  Component Value Date   GFR 80.37 02/21/2023   GFR 78.91 04/27/2021   GFR 73.66 06/28/2020    Plan: Continue all antihypertensives at current dosages. No added salt. Will keep sodium content to 1,500 mg or less per day.     Morbid Obesity: Current BMI BMI (Calculated): 40.05   Pharmacotherapy Plan Continue and refill  Ozempic 2 mg SQ weekly  Michelle Kennedy is currently in the action stage of change. As such, her goal is to continue with weight loss efforts.  She has agreed to keeping a food journal with goal of 1,600 calories and 90 grams of protein daily.  Exercise goals: Older adults should determine their level of effort for physical activity relative to their level of fitness.   Behavioral modification strategies: increasing lean protein  intake, no meal skipping, meal planning , increase water intake, better snacking choices, and planning for success.  Michelle Kennedy has agreed to follow-up with our clinic in 6 weeks.      Objective:   VITALS: Per patient if applicable, see vitals. GENERAL: Alert and in no acute distress. CARDIOPULMONARY: No increased WOB. Speaking in clear sentences.  PSYCH: Pleasant and cooperative. Speech normal rate and rhythm. Affect is appropriate. Insight and judgement are  appropriate. Attention is focused, linear, and appropriate.  NEURO: Oriented as arrived to appointment on time with no prompting.   Attestation Statements:    This was prepared with the assistance of Engineer, civil (consulting).  Occasional wrong-word or sound-a-like substitutions may have occurred due to the inherent limitations of voice recognition software. Corinna Capra, DO

## 2023-03-02 ENCOUNTER — Ambulatory Visit: Payer: Medicare Other | Admitting: Physical Therapy

## 2023-03-02 ENCOUNTER — Encounter: Payer: Self-pay | Admitting: Physical Therapy

## 2023-03-02 DIAGNOSIS — M25561 Pain in right knee: Secondary | ICD-10-CM

## 2023-03-02 DIAGNOSIS — M545 Low back pain, unspecified: Secondary | ICD-10-CM | POA: Diagnosis not present

## 2023-03-02 DIAGNOSIS — M6281 Muscle weakness (generalized): Secondary | ICD-10-CM

## 2023-03-02 DIAGNOSIS — G8929 Other chronic pain: Secondary | ICD-10-CM

## 2023-03-02 DIAGNOSIS — R252 Cramp and spasm: Secondary | ICD-10-CM

## 2023-03-02 DIAGNOSIS — M25572 Pain in left ankle and joints of left foot: Secondary | ICD-10-CM

## 2023-03-06 ENCOUNTER — Encounter: Payer: Medicare Other | Admitting: Physical Therapy

## 2023-03-10 ENCOUNTER — Other Ambulatory Visit: Payer: Self-pay | Admitting: Family

## 2023-03-13 ENCOUNTER — Encounter: Payer: Medicare Other | Admitting: Physical Therapy

## 2023-03-16 ENCOUNTER — Encounter: Payer: Medicare Other | Admitting: Physical Therapy

## 2023-03-19 ENCOUNTER — Other Ambulatory Visit: Payer: Self-pay

## 2023-03-19 ENCOUNTER — Ambulatory Visit (INDEPENDENT_AMBULATORY_CARE_PROVIDER_SITE_OTHER): Payer: Medicare Other | Admitting: Family Medicine

## 2023-03-19 VITALS — BP 130/82 | HR 76 | Ht 66.0 in | Wt 255.0 lb

## 2023-03-19 DIAGNOSIS — M25561 Pain in right knee: Secondary | ICD-10-CM | POA: Diagnosis not present

## 2023-03-19 DIAGNOSIS — M1711 Unilateral primary osteoarthritis, right knee: Secondary | ICD-10-CM

## 2023-03-19 DIAGNOSIS — Z9682 Presence of neurostimulator: Secondary | ICD-10-CM

## 2023-03-19 DIAGNOSIS — M545 Low back pain, unspecified: Secondary | ICD-10-CM | POA: Diagnosis not present

## 2023-03-19 DIAGNOSIS — G8929 Other chronic pain: Secondary | ICD-10-CM | POA: Diagnosis not present

## 2023-03-19 DIAGNOSIS — M533 Sacrococcygeal disorders, not elsewhere classified: Secondary | ICD-10-CM

## 2023-03-19 MED ORDER — TRIAMCINOLONE ACETONIDE 32 MG IX SRER
32.0000 mg | Freq: Once | INTRA_ARTICULAR | Status: AC
Start: 2023-03-19 — End: 2023-03-19
  Administered 2023-03-19: 32 mg via INTRA_ARTICULAR

## 2023-03-19 NOTE — Patient Instructions (Addendum)
Thank you for coming in today.   You should hear from MRI scheduling within 1 week. If you do not hear please let me know.    Call or go to the ER if you develop a large red swollen joint with extreme pain or oozing puss.    Please let me know if you have trouble with the MRI.

## 2023-03-19 NOTE — Telephone Encounter (Signed)
Scheduled 03/19/23 for LBP

## 2023-03-19 NOTE — Progress Notes (Signed)
Rubin Payor, PhD, LAT, ATC acting as a scribe for [name], MD.  Michelle Kennedy is a 67 y.o. female who presents to [facility] at [facility] today for cont'd LBP. Pt was last seen by Dr. Denyse Amass on 01/17/23 and was given a L SI joint steroid injection and was prescribed tramadol and her methocarbamol was refilled. Pt sent f/u MyChart messages and was referred to PT, completing 6 visits.  Today, pt reports PT was pretty helpful, but she is now having pain around her sacrum, bilaterally. She got about 1 month-6 wks of relief from prior L SI joint steroid injection.  She reports her R knee pain has also gradually returned. +swelling. +mechanical symptoms. Zilretta injection R knee, 11/21/22.  Dx imaging: 01/17/23 L-spine & sacrum/coccyx XR 09/20/21 Sacrum/coccyx XR   Pertinent review of systems: No fevers or chills  Relevant historical information: MRI conditional bladder stimulator implanted 08/29/22 Medtronic Model Number: [id number] History of breast cancer.  Exam:  BP 130/82   Pulse 76   Ht 5\' 6"  (1.676 m)   Wt 255 lb (115.7 kg)   SpO2 96%   BMI 41.16 kg/m  General: Well Developed, well nourished, and in no acute distress.   MSK: L-spine: Normal appearing Nontender to palpation spinal midline. Tender palpation sacrum midline. Decreased lumbar motion. Lower extremity strength is intact.  Right knee: Moderate effusion.  Normal motion.  Tender palpation medial joint line.    Lab and Radiology Results   Zilretta injection right knee Procedure: Real-time Ultrasound Guided Injection of right knee joint superior lateral patellar space Device: Philips Affiniti 50G Images permanently stored and available for review in PACS Verbal informed consent obtained.  Discussed risks and benefits of procedure. Warned about infection, hyperglycemia bleeding, damage to structures among others. Patient expresses understanding and  agreement Time-out conducted.   Noted no overlying erythema, induration, or other signs of local infection.   Skin prepped in a sterile fashion.   Local anesthesia: Topical Ethyl chloride.   With sterile technique and under real time ultrasound guidance: Zilretta 32 mg injected into knee joint. Fluid seen entering the joint capsule.   Completed without difficulty   Advised to call if fevers/chills, erythema, induration, drainage, or persistent bleeding.   Images permanently stored and available for review in the ultrasound unit.  Impression: Technically successful ultrasound guided injection.         Assessment and Plan: 67 y.o. female with right knee pain thought to be exacerbation of DJD.  Plan for repeat Zilretta injection today.  Chronic low back pain thought to be degenerative changes of the lower portion of the lumbar spine or sacroiliitis or perhaps even a stress fracture.  X-rays were unrevealing.  Plan for MRI of the low back and sacrum as her symptoms are worsening despite conservative management.  She does have an MRI conditional bladder stimulator.  This will require an MRI to be done in the hospital with appropriate monitoring.   PDMP not reviewed this encounter. Orders Placed This Encounter  Procedures   [place] LIMITED JOINT SPACE STRUCTURES LOW RIGHT(NO LINKED CHARGES)    Order Specific Question:   Reason for Exam (SYMPTOM  OR DIAGNOSIS REQUIRED)    Answer:   right knee pain    Order Specific Question:   Preferred imaging location?    Answer:   [facility]   MR Lumbar Spine Wo Contrast    Standing Status:   Future  Standing Expiration Date:   03/18/2024    Order Specific Question:   What is the patient's sedation requirement?    Answer:   No Sedation    Order Specific Question:   Does the patient have a pacemaker or implanted devices?    Answer:   Yes    Order Specific Question:   Manufacturer of pacemake or implanted device?    Answer:    Medtronic    Order Specific Question:   Year of pacemaker or implanted device?    Answer:   08/29/2022    Order Specific Question:   Preferred imaging location?    Answer:   [facility] (table limit - 500 lbs)   MR SACRUM SI JOINTS WO CONTRAST    Standing Status:   Future    Standing Expiration Date:   03/18/2024    Order Specific Question:   What is the patient's sedation requirement?    Answer:   No Sedation    Order Specific Question:   Does the patient have a pacemaker or implanted devices?    Answer:   Yes    Order Specific Question:   Manufacturer of pacemake or implanted device?    Answer:   Medtronic    Order Specific Question:   Year of pacemaker or implanted device?    Answer:   08/29/2022    Order Specific Question:   Preferred imaging location?    Answer:   [facility] (table limit - 500 lbs)   Meds ordered this encounter  Medications   Triamcinolone Acetonide (ZILRETTA) intra-articular injection 32 mg     Discussed warning signs or symptoms. Please see discharge instructions. Patient expresses understanding.   The above documentation has been reviewed and is accurate and complete [name], M.D.

## 2023-03-20 NOTE — Telephone Encounter (Signed)
Pt received Zilretta inj for RIGHT knee OA on 03/19/23.  Can consider repeat inj on or after 06/12/23

## 2023-03-28 ENCOUNTER — Ambulatory Visit (HOSPITAL_COMMUNITY): Payer: Medicare Other

## 2023-03-29 ENCOUNTER — Ambulatory Visit (HOSPITAL_COMMUNITY)
Admission: RE | Admit: 2023-03-29 | Discharge: 2023-03-29 | Disposition: A | Discharge status: Home / Self Care | Payer: Medicare Other | Source: Ambulatory Visit | Attending: Family Medicine | Admitting: Family Medicine

## 2023-03-29 DIAGNOSIS — G8929 Other chronic pain: Secondary | ICD-10-CM | POA: Insufficient documentation

## 2023-03-29 DIAGNOSIS — M545 Low back pain, unspecified: Secondary | ICD-10-CM

## 2023-03-29 DIAGNOSIS — M533 Sacrococcygeal disorders, not elsewhere classified: Secondary | ICD-10-CM | POA: Diagnosis present

## 2023-04-03 ENCOUNTER — Encounter: Payer: Self-pay | Admitting: Family Medicine

## 2023-04-05 ENCOUNTER — Encounter: Payer: Self-pay | Admitting: Internal Medicine

## 2023-04-05 ENCOUNTER — Ambulatory Visit (INDEPENDENT_AMBULATORY_CARE_PROVIDER_SITE_OTHER): Payer: Medicare Other | Admitting: Internal Medicine

## 2023-04-05 VITALS — BP 136/70 | HR 71 | Ht 66.0 in | Wt 255.0 lb

## 2023-04-05 DIAGNOSIS — C73 Malignant neoplasm of thyroid gland: Secondary | ICD-10-CM | POA: Diagnosis not present

## 2023-04-05 DIAGNOSIS — E890 Postprocedural hypothyroidism: Secondary | ICD-10-CM

## 2023-04-05 DIAGNOSIS — R7303 Prediabetes: Secondary | ICD-10-CM

## 2023-04-05 LAB — HM DIABETES EYE EXAM

## 2023-04-05 NOTE — Patient Instructions (Signed)
Please continue Levothyroxine 150 mcg daily.  Take the thyroid hormone every day, with water, at least 30 minutes before breakfast, separated by at least 4 hours from: - acid reflux medications - calcium - iron - multivitamins  Please stop at the lab.  Please return in 1 year.
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
## 2023-04-06 LAB — THYROGLOBULIN LEVEL: Thyroglobulin: 0.1 ng/mL — ABNORMAL LOW

## 2023-04-06 LAB — THYROGLOBULIN ANTIBODY: Thyroglobulin Ab: <1 [IU]/mL (ref <=1)

## 2023-04-12 ENCOUNTER — Ambulatory Visit: Payer: Medicare Other | Admitting: Bariatrics

## 2023-04-12 ENCOUNTER — Encounter: Payer: Self-pay | Admitting: Bariatrics

## 2023-04-12 VITALS — BP 117/81 | HR 80 | Ht 66.0 in | Wt 248.0 lb

## 2023-04-12 DIAGNOSIS — Z6841 Body Mass Index (BMI) 40.0 and over, adult: Secondary | ICD-10-CM

## 2023-04-12 DIAGNOSIS — R7303 Prediabetes: Secondary | ICD-10-CM

## 2023-04-12 DIAGNOSIS — E782 Mixed hyperlipidemia: Secondary | ICD-10-CM

## 2023-04-12 DIAGNOSIS — E785 Hyperlipidemia, unspecified: Secondary | ICD-10-CM

## 2023-04-12 DIAGNOSIS — E669 Obesity, unspecified: Secondary | ICD-10-CM | POA: Diagnosis not present

## 2023-04-12 MED ORDER — SEMAGLUTIDE (2 MG/DOSE) 8 MG/3ML ~~LOC~~ SOPN: 2.0000 mg | PEN_INJECTOR | SUBCUTANEOUS | 0 refills | Status: DC

## 2023-04-12 NOTE — Progress Notes (Signed)
WEIGHT SUMMARY AND BIOMETRICS  Weight Lost Since Last Visit: 0  Weight Gained Since Last Visit: 0   Vitals BP: 117/81 Pulse Rate: 80 SpO2: 98 %   Anthropometric Measurements Height: 5\' 6"  (1.676 m) Weight: 248 lb (112.5 kg) BMI (Calculated): 40.05 Weight at Last Visit: 248 lb Weight Lost Since Last Visit: 0 Weight Gained Since Last Visit: 0 Starting Weight: 275 lb Total Weight Loss (lbs): 27 lb (12.2 kg)   Body Composition  Body Fat %: 49.4 % Fat Mass (lbs): 122.8 lbs Muscle Mass (lbs): 119.2 lbs Total Body Water (lbs): 87.6 lbs Visceral Fat Rating : 17   Other Clinical Data Today's Visit #: 13 Starting Date: 07/28/21    OBESITY Michelle Kennedy is here to discuss her progress with her obesity treatment plan along with follow-up of her obesity related diagnoses.    Nutrition Plan: keeping a food journal with goal of 1600 calories and 90 grams of protein daily - 80% adherence.  Current exercise: exercising for 60 minutes x 3 days a week  Interim History:  Her weight remains the same. She is still having some back pain. She is walking some. Eating all of the food on the plan., Is not skipping meals, Meeting protein goals., and Water intake is adequate.   Pharmacotherapy: Michelle Kennedy is on Ozempic 2 mg SQ weekly and Metformin 500 mg once daily breakfast Adverse side effects: None Hunger is moderately controlled.  Cravings are moderately controlled.  Assessment/Plan:   Prediabetes Last A1c was 5.9  Medication(s):  Ozempic 2 mg SQ weekly and Metformin 500 mg daily Lab Results  Component Value Date   HGBA1C 5.9 02/21/2023   HGBA1C 5.7 (A) 10/03/2022   HGBA1C 5.7 (H) 07/06/2022   HGBA1C 5.7 (A) 02/02/2022   HGBA1C 5.6 10/28/2021   Lab Results  Component Value Date   INSULIN 18.0 07/06/2022   INSULIN 25.6 (H) 07/28/2021   INSULIN 21.8 09/20/2016     Plan: Will minimize all refined carbohydrates both sweets and starches.  Will work on the plan and exercise.  Consider both aerobic and resistance training.  Will keep protein, water, and fiber intake high.  Increase Polyunsaturated and Monounsaturated fats to increase satiety and encourage weight loss.  Aim for 7 to 9 hours of sleep nightly.  Will continue medications.   Continue and refill Ozempic 2 mg SQ weekly    Hyperlipidemia LDL is not at goal. Medication(s): Crestor  Cardiovascular risk factors: advanced age (older than 49 for men, 53 for women), diabetes mellitus, dyslipidemia, hypertension, obesity (BMI >= 30 kg/m2), and sedentary lifestyle  Lab Results  Component Value Date   CHOL 170 07/06/2022   HDL 73 07/06/2022   LDLCALC 81 07/06/2022   LDLDIRECT 117.0 02/26/2019   TRIG 85 07/06/2022   CHOLHDL 3 06/28/2020   Lab Results  Component Value Date   ALT 27 02/21/2023  AST 17 02/21/2023   ALKPHOS 66 02/21/2023   BILITOT 0.2 02/21/2023   The 10-year ASCVD risk score (Arnett DK, et al., 2019) is: 7.5%   Values used to calculate the score:     Age: 67 years     Sex: Female     Is Non-Hispanic African American: No     Diabetic: No     Tobacco smoker: No     Systolic Blood Pressure: 117 mmHg     Is BP treated: Yes     HDL Cholesterol: 73 mg/dL     Total Cholesterol: 170 mg/dL  Plan:  Continue statin.  Will avoid all trans fats.  Will read labels Will minimize saturated fats except the following: low fat meats in moderation, diary, and limited dark chocolate.    Morbid Obesity: Current BMI BMI (Calculated): 40.05   Pharmacotherapy Plan Continue and refill  Ozempic 2 mg SQ weekly  [name] is currently in the action stage of change. As such, her goal is to continue with weight loss efforts.  She has agreed to keeping a food journal with goal of 1,600 calories and 90 grams of protein daily.  Exercise goals: Older adults should follow the adult  guidelines. When older adults cannot meet the adult guidelines, they should be as physically active as their abilities and conditions will allow.   Behavioral modification strategies: increasing lean protein intake, meal planning , decrease liquid calories, increase water intake, better snacking choices, planning for success, increasing vegetables, increasing lower sugar fruits, get rid of junk food in the home, decrease snacking , avoiding temptations, keep healthy foods in the home, travel eating strategies, and mindful eating.  [name] has agreed to follow-up with our clinic in 6 weeks.      Objective:   VITALS: Per patient if applicable, see vitals. GENERAL: Alert and in no acute distress. CARDIOPULMONARY: No increased WOB. Speaking in clear sentences.  PSYCH: Pleasant and cooperative. Speech normal rate and rhythm. Affect is appropriate. Insight and judgement are appropriate. Attention is focused, linear, and appropriate.  NEURO: Oriented as arrived to appointment on time with no prompting.   Attestation Statements:   This was prepared with the assistance of Engineer, civil (consulting).  Occasional wrong-word or sound-a-like substitutions may have occurred due to the inherent limitations of voice recognition   Corinna Capra, DO

## 2023-04-13 NOTE — Progress Notes (Signed)
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  This looks stable from a CT scan from 2015 so I do not think it needs any further attention.  Recommend scheduling appointment with me to talk about the MRI of the back and the sacrum in further detail.

## 2023-04-13 NOTE — Progress Notes (Signed)
Lumbar spine MRI some back arthritis and a cyst originating from one of the joints in the low back that looks like it could be causing some pinched nerves.  This could be treated with injection or surgery.  Recommend scheduling an appointment with me to go over the results of this and the sacrum MRI in detail and talk about what your options are.

## 2023-04-16 ENCOUNTER — Ambulatory Visit (INDEPENDENT_AMBULATORY_CARE_PROVIDER_SITE_OTHER): Payer: Medicare Other | Admitting: Family Medicine

## 2023-04-16 VITALS — BP 132/84 | HR 71 | Ht 66.0 in | Wt 251.0 lb

## 2023-04-16 DIAGNOSIS — M47816 Spondylosis without myelopathy or radiculopathy, lumbar region: Secondary | ICD-10-CM | POA: Diagnosis not present

## 2023-04-16 DIAGNOSIS — M7138 Other bursal cyst, other site: Secondary | ICD-10-CM | POA: Diagnosis not present

## 2023-04-16 MED ORDER — TRAMADOL HCL 50 MG PO TABS: 50.0000 mg | ORAL_TABLET | Freq: Four times a day (QID) | ORAL | 0 refills | Status: DC | PRN

## 2023-04-16 NOTE — Progress Notes (Signed)
Michelle Payor, PhD, LAT, ATC acting as a scribe for Michelle Graham, MD.  Michelle Kennedy is a 67 y.o. female who presents to [facility] today for f/u LBP w/ MRI review. Pt was last seen by Dr. Denyse Amass on 03/19/23 and was given a R knee Zilretta injection and hospital based MRI's were ordered.   Today, pt reports LBP has worsened since her last visit. She is now experiencing muscle spasms. Pt locates pain to the midline to L-side of her low back at the most distal portion. She has been taking Tylenol and is almost out of Tramadol. She is leaving for a trip to [place], on Dec 14th.  Dx imaging: 03/29/23 L-spine & Sacrum/SI joint MRI 01/17/23 L-spine & sacrum/coccyx XR 09/20/21 Sacrum/coccyx XR   Pertinent review of systems: No fevers or chills  Relevant historical information: History of a DVT History of thyroid cancer  Exam:  BP 132/84   Pulse 71   Ht 5\' 6"  (1.676 m)   Wt 251 lb (113.9 kg)   SpO2 97%   BMI 40.51 kg/m  General: Well Developed, well nourished, and in no acute distress.   MSK: L-spine: Normal appearing Nontender palpation spinal midline.  Decreased lumbar motion.    Lab and Radiology Results  MR SACRUM SI JOINTS WO CONTRAST  Result Date: 04/12/2023 CLINICAL DATA:  Sacral pain. Chronic bilateral lower back pain. Chronic left sacroiliac joint pain. EXAM: MRI SACRUM WITHOUT CONTRAST TECHNIQUE: Multiplanar multi-sequence MR imaging of the sacrum was performed. No intravenous contrast was administered. COMPARISON:  Sacrum and coccyx radiographs 01/17/2023; PET-CT 03/19/2014 FINDINGS: Urinary Tract: The partially visualized urinary bladder is unremarkable. Bowel:  No gross pelvic bowel abnormality is visualized. Vascular/Lymphatic: No iliac artery aneurysm is seen. Other: There is metallic artifact related to an electrode within the right S3 sacral foramen (sagittal image 10, oblique coronal series 16, image 14, axial images 19 and 20).  Musculoskeletal: Segmentation:  Standard. Alignment:  Physiologic. The L4, L5, and sacral vertebral body heights are maintained. Mild L5-S1 disc space narrowing with diffuse decreased disc hydration. Decreased disc hydration also some linear fluid bright signal degenerative change within the L4-5 disc level. Note is made of a left L4-5 facet joint synovial cyst impressing on the posterior left aspect of the thecal sac at the L4-5 disc level (sagittal series 15, image 17), as described on contemporaneous MRI of the lumbar spine report. There is an oval, well-circumscribed intermediate T1 (hyperintense to skeletal muscle) and intermediate to increased T2 signal lesion within the posterior inferior left ilium (axial image 15/30, coronal image 18/25, sagittal image 26/30) measuring up to 16 x 14 x 20 mm (transverse by AP by craniocaudal). The surrounding cortex is intact without endosteal scalloping. No periosteal reaction. No surrounding marrow edema. This demonstrates benign MRI features. On prior 03/19/2014 CT there appears to be very subtle centrally lucent lesion with thin peripheral sclerotic border in the same location without abnormal increased uptake, therefore this likely reflects long-term stability. Mild to moderate left and mild right sacroiliac cartilage thinning and joint space narrowing. IMPRESSION: 1. Mild to moderate left and mild right sacroiliac cartilage thinning and joint space narrowing. 2. Mild L5-S1 and L4-5 degenerative disc changes. 3. 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 4. Metallic artifact related to a stimulator electrode within the right S3 sacral  foramen. 5. Benign-appearing lesion within the posterior inferior left ilium measuring up to 20 mm. This demonstrates benign MRI features. On prior 03/19/2014 CT there appears to be very subtle  centrally lucent lesion with thin peripheral sclerotic border in the same location without abnormal increased uptake, therefore this likely reflects long-term stability. Electronically Signed   By: Neita Garnet M.D.   On: 04/12/2023 14:42   MR Lumbar Spine Wo Contrast  Result Date: 04/12/2023 CLINICAL DATA:  Lower back pain. Symptoms persist with greater than 6 weeks treatment. Chronic bilateral lower back pain without sciatica. Chronic left sacroiliac joint pain. EXAM: MRI LUMBAR SPINE WITHOUT CONTRAST TECHNIQUE: Multiplanar, multisequence MR imaging of the lumbar spine was performed. No intravenous contrast was administered. COMPARISON:  Lumbar spine radiographs 01/17/2023, sacrum and coccyx radiographs 01/17/2023, lumbar spine radiographs 12/17/2019; chest radiographs 04/21/2022 FINDINGS: Segmentation: On prior chest radiograph and CT chest, there are 12 rib-bearing thoracic type vertebral bodies. There are 5 non-rib-bearing lumbar-type vertebral bodies. Alignment: Minimal 3 mm retrolisthesis of L3 on L4 and 2 mm retrolisthesis of L2 on L3, similar to prior radiographs. Vertebrae: Vertebral body heights are maintained.Mild left-greater-than-right T11-12 disc space narrowing with diffuse decreased disc hydration. Minimal L2-3 and L3-4 and moderate L5-S1 disc space narrowing with decreased disc hydration. L4-5 decreased disc hydration. Mild left T11-12 edematous marrow endplate degenerative changes. Conus medullaris and cauda equina: Conus extends to the inferior L1 level. Conus and cauda equina appear normal. Paraspinal and other soft tissues: The kidneys are partially visualized. There appear to be left parapelvic cysts. Inferior right renal 11 mm cortical cyst. No follow-up imaging is recommended. Probable normal variant right extrarenal pelvis, with normal caliber of the bilateral proximal ureters. Disc levels: T11-12: Seen on sagittal images only. Minimal posterior disc bulge. No central canal or  neuroforaminal stenosis. T12-L1: No posterior disc bulge, central canal narrowing, or neuroforaminal stenosis. L1-2: Mild bilateral facet joint hypertrophy. No posterior disc bulge, central canal narrowing, or neuroforaminal stenosis. L2-3: Mild-to-moderate bilateral facet joint hypertrophy. Mild broad-based posterior disc osteophyte complex with mild bilateral intraforaminal extension. No central canal or neuroforaminal stenosis. L3-4: Mild-to-moderate bilateral facet joint hypertrophy. Mild bilateral facet joint effusions. Mild broad-based posterior disc osteophyte complex with mild to moderate right and mild left intraforaminal extension. Mild right neuroforaminal stenosis. No central canal stenosis. L4-5: Moderate left and mild-to-moderate right facet joint hypertrophy. Moderate bilateral facet joint effusions. Minimal posterior disc bulge and posterior endplate spurring. There is an oval decreased T1 and increased T2 signal structure bordering the anterior aspect of the left facet joint and the left L4 pedicle measuring up to 6 x 10 x 12 mm (transverse by AP by craniocaudal, axial series 8, image 28 and sagittal series 5, image 18). This is most consistent with a facet joint synovial cyst that impresses on the posterior left aspect of the thecal sac and contributes to mild to moderate left greater than right central canal stenosis. This facet joint synovial cyst mildly contacts the posterior aspect of the descending left L5 nerve at the level of the L4-5 disc space (axial series 8, image 28) and medially displaces the descending left S1 and S2 nerves and likely also the S3 (axial series 8, image 28) at the L4-5 disc space. L5-S1: Mild-to-moderate right-greater-than-left facet joint hypertrophy. Mild broad-based posterior disc osteophyte complex with mild-to-moderate bilateral intraforaminal extension. Mild-to-moderate bilateral neuroforaminal stenosis. No central canal stenosis. IMPRESSION: 1. At L4-5, there is  a 6 x 10 x 12 mm oval likely facet joint synovial  cyst bordering the anterior aspect of the left facet joint and the left L4 pedicle. This impresses on the posterior left aspect of the thecal sac and contributes to mild-to-moderate left greater than right central canal stenosis. This facet mildly contacts the posterior aspect of the descending left L5 nerve and medially displaces the descending left S1 and S2 nerves and likely also the S3 nerves at the level of the L4-5 disc space. 2. At L5-S1, there is mild-to-moderate bilateral neuroforaminal stenosis. 3. At L3-4, there is mild right neuroforaminal stenosis. Electronically Signed   By: Neita Garnet M.D.   On: 04/12/2023 14:26   [place] LIMITED JOINT SPACE STRUCTURES LOW RIGHT(NO LINKED CHARGES)  Result Date: 04/11/2023 Zilretta injection right knee Procedure: Real-time Ultrasound Guided Injection of right knee joint superior lateral patellar space Device: Philips Affiniti 50G Images permanently stored and available for review in PACS Verbal informed consent obtained.  Discussed risks and benefits of procedure. Warned about infection, hyperglycemia bleeding, damage to structures among others. Patient expresses understanding and agreement Time-out conducted.  Noted no overlying erythema, induration, or other signs of local infection.  Skin prepped in a sterile fashion.  Local anesthesia: Topical Ethyl chloride.  With sterile technique and under real time ultrasound guidance: Zilretta 32 mg injected into knee joint. Fluid seen entering the joint capsule.  Completed without difficulty  Advised to call if fevers/chills, erythema, induration, drainage, or persistent bleeding.  Images permanently stored and available for review in the ultrasound unit. Impression: Technically successful ultrasound guided injection.    MM 3D SCREENING MAMMOGRAM BILATERAL BREAST  Result Date: 01/25/2023 CLINICAL DATA:  Screening. Personal history of malignant LEFT breast lumpectomy in  2015. EXAM: DIGITAL SCREENING BILATERAL MAMMOGRAM WITH TOMOSYNTHESIS AND CAD TECHNIQUE: Bilateral screening digital craniocaudal and mediolateral oblique mammograms were obtained. Bilateral screening digital breast tomosynthesis was performed. The images were evaluated with computer-aided detection. COMPARISON:  Previous exam(s). ACR Breast Density Category b: There are scattered areas of fibroglandular density. FINDINGS: There are no findings suspicious for malignancy. Expected post lumpectomy changes involving the LEFT breast. IMPRESSION: No mammographic evidence of malignancy. A result letter of this screening mammogram will be mailed directly to the patient. RECOMMENDATION: Screening mammogram in one year. (Code:SM-B-01Y) BI-RADS CATEGORY  2: Benign. Electronically Signed   By: Hulan Saas M.D.   On: 01/25/2023 13:45   [place] LIMITED JOINT SPACE STRUCTURES LOW LEFT(NO LINKED CHARGES)  Result Date: 01/24/2023 Procedure: Real-time Ultrasound Guided Injection of left SI joint Device: Philips Affiniti 50G/GE Logiq Images permanently stored and available for review in PACS Verbal informed consent obtained.  Discussed risks and benefits of procedure. Warned about infection, bleeding, hyperglycemia damage to structures among others. Patient expresses understanding and agreement Time-out conducted.  Noted no overlying erythema, induration, or other signs of local infection.  Skin prepped in a sterile fashion.  Local anesthesia: Topical Ethyl chloride.  With sterile technique and under real time ultrasound guidance: 40 mg of Kenalog and 2 mL of Marcaine injected into left SI joint. Fluid seen entering the joint capsule.  Completed without difficulty  Pain moderately resolved suggesting accurate placement of the medication.  Advised to call if fevers/chills, erythema, induration, drainage, or persistent bleeding.  Images permanently stored and available for review in the ultrasound unit. Impression: Technically  successful ultrasound guided injection.   DG Sacrum/Coccyx  Result Date: 01/22/2023 CLINICAL DATA:  Pain for 6 years. EXAM: SACRUM AND COCCYX - 3 VIEW COMPARISON:  None Available. FINDINGS: There is no evidence of fracture or  other focal bone lesions. Right sided bladder electronic stimulation device with presacral wire. IMPRESSION: Negative. Electronically Signed   By: Layla Maw M.D.   On: 01/22/2023 09:39   DG Lumbar Spine 2-3 Views  Result Date: 01/22/2023 CLINICAL DATA:  low back SI joint pain EXAM: LUMBAR SPINE - 3 VIEW COMPARISON:  12/17/2019. FINDINGS: No fracture, dislocation or subluxation. No spondylolisthesis. No osteolytic or osteoblastic changes. Electronic stimulation device with a wire in the presacral space. Degenerative disc disease noted with disc space narrowing and marginal osteophytes at L3-S1. IMPRESSION: Degenerative changes. No acute osseous abnormalities. Electronically Signed   By: Layla Maw M.D.   On: 01/22/2023 09:38    I, Michelle Kennedy, personally (independently) visualized and performed the interpretation of the MRI images attached in this note.     Assessment and Plan: 67 y.o. female with left low back pain without much radiation.  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  Plan on epidural steroid injection and facet injections trying to puncture or otherwise treat with steroids that synovial cyst.  If this is not technically possible or she does have the injection and it is not helpful would recommend referral to neurosurgery. Limited tramadol refilled  PDMP reviewed during this encounter. Orders Placed This Encounter  Procedures   DG INJECT DIAG/THERA/INC NEEDLE/CATH/PLC EPI/LUMB/SAC W/IMG    Standing Status:   Future    Standing Expiration Date:   04/15/2024    Order Specific Question:   Reason for Exam (SYMPTOM  OR DIAGNOSIS  REQUIRED)    Answer:   ESI and Facet injection to target left L4-5 fcet and synovial cyst    Order Specific Question:   Preferred Imaging Location?    Answer:   [facility]    Order Specific Question:   Radiology Contrast Protocol - do NOT remove file path    Answer:   [email or web address]   DG FACET JT INJ L /S SINGLE LEVEL LEFT W/FL/CT    Standing Status:   Future    Standing Expiration Date:   04/15/2024    Order Specific Question:   Reason for Exam (SYMPTOM  OR DIAGNOSIS REQUIRED)    Answer:   ESI Left L4-5 facet inejction and synovial cyst    Order Specific Question:   Preferred Imaging Location?    Answer:   [facility]    Order Specific Question:   Radiology Contrast Protocol - do NOT remove file path    Answer:   \\[email or web address]   Meds ordered this encounter  Medications   traMADol (ULTRAM) 50 MG tablet    Sig: Take 1 tablet (50 mg total) by mouth every 6 (six) hours as needed for moderate pain (pain score 4-6) or severe pain (pain score 7-10).    Dispense:  20 tablet    Refill:  0     Discussed warning signs or symptoms. Please see discharge instructions. Patient expresses understanding.   The above documentation has been reviewed and is accurate and complete Michelle Kennedy, M.D.

## 2023-04-16 NOTE — Patient Instructions (Signed)
Thank you for coming in today.   Please call [facility] ([facility]) at [phone] to schedule your spine injection.    Let me know if they say no or you have a problem.

## 2023-04-17 ENCOUNTER — Telehealth: Payer: Self-pay

## 2023-04-17 NOTE — Telephone Encounter (Signed)
Pt called and was confused w/ what she was told by [facility]. Per Dr. Mosetta Putt, he advised that they perform just the facet injection and then wait for 2 weeks to then do the epidural.  Pt is leaving to visit her grandkids in [place]; Dec 14-17.  If you would like to consult w/ Dr. Mosetta Putt, he get's in around 11:00 today and you can call the reading room and discuss [phone])

## 2023-04-23 NOTE — Telephone Encounter (Signed)
I called and discussed this with [name] and Dr Mosetta Putt

## 2023-04-24 ENCOUNTER — Inpatient Hospital Stay: Admission: RE | Admit: 2023-04-24 | Payer: Medicare Other | Source: Ambulatory Visit

## 2023-04-24 ENCOUNTER — Telehealth: Payer: Self-pay | Admitting: Family Medicine

## 2023-04-24 ENCOUNTER — Other Ambulatory Visit: Payer: Medicare Other

## 2023-04-24 DIAGNOSIS — M47816 Spondylosis without myelopathy or radiculopathy, lumbar region: Secondary | ICD-10-CM

## 2023-04-24 DIAGNOSIS — M7138 Other bursal cyst, other site: Secondary | ICD-10-CM

## 2023-04-24 NOTE — Telephone Encounter (Signed)
Michelle Kennedy  is unable to have the back injection due to contrast allergy.  When I called her to explain that she notes worsening weakness and now some incontinence.  I placed an urgent referral to neurosurgery.  She should call their office and get an appointment this week.

## 2023-04-25 ENCOUNTER — Telehealth: Payer: Self-pay | Admitting: Family Medicine

## 2023-04-25 NOTE — Telephone Encounter (Signed)
Pt called, has been scheduled to see Dr. Steele Sizer at [facility] this Friday.  They are requesting we fax the following info before her appt: MRI Xray PT notes Any injections

## 2023-04-25 NOTE — Telephone Encounter (Signed)
Faxed all the stuff patient was looking for. This was a lot of records that we printed if they need more patient will need to sign a release form

## 2023-04-25 NOTE — Telephone Encounter (Signed)
Pt called as she has not heard from [facility]. We sent urgent referral yesterday afternoon and pt has left a message already.  Per pt, Dr. Denyse Amass told her to call him if she had to heard back by today and he was going to call the surgeon.

## 2023-04-29 ENCOUNTER — Other Ambulatory Visit: Payer: Self-pay | Admitting: Podiatry

## 2023-04-29 ENCOUNTER — Other Ambulatory Visit: Payer: Self-pay | Admitting: Family Medicine

## 2023-04-30 ENCOUNTER — Encounter: Payer: Self-pay | Admitting: Family Medicine

## 2023-04-30 DIAGNOSIS — M7138 Other bursal cyst, other site: Secondary | ICD-10-CM

## 2023-04-30 NOTE — Telephone Encounter (Signed)
Last OV 04/16/23  Last refill 02/02/23 Qty #90/1

## 2023-05-03 ENCOUNTER — Telehealth (INDEPENDENT_AMBULATORY_CARE_PROVIDER_SITE_OTHER): Payer: Self-pay | Admitting: Bariatrics

## 2023-05-03 ENCOUNTER — Other Ambulatory Visit: Payer: Self-pay

## 2023-05-03 DIAGNOSIS — R7303 Prediabetes: Secondary | ICD-10-CM

## 2023-05-03 MED ORDER — SEMAGLUTIDE (2 MG/DOSE) 8 MG/3ML ~~LOC~~ SOPN: 2.0000 mg | PEN_INJECTOR | SUBCUTANEOUS | 0 refills | Status: DC

## 2023-05-03 NOTE — Telephone Encounter (Signed)
12/12 Pt called in stating she just took her last shot of Ozempic and she is going out of town for the next 2 weeks and she needs a refill. Please follow up with the patient.

## 2023-05-05 ENCOUNTER — Other Ambulatory Visit: Payer: Self-pay | Admitting: Family

## 2023-05-08 MED ORDER — TRAMADOL HCL 50 MG PO TABS: 50.0000 mg | ORAL_TABLET | Freq: Four times a day (QID) | ORAL | 0 refills | Status: DC | PRN

## 2023-05-08 NOTE — Addendum Note (Signed)
Addended by: Rodolph Bong on: 05/08/2023 07:35 AM   Modules accepted: Orders

## 2023-05-10 MED ORDER — TRAMADOL HCL 50 MG PO TABS: 50.0000 mg | ORAL_TABLET | Freq: Four times a day (QID) | ORAL | 0 refills | Status: DC | PRN

## 2023-05-10 NOTE — Telephone Encounter (Signed)
Rx pending. Pharmacy updated. Forwarding to Dr. Denyse Amass.

## 2023-05-10 NOTE — Addendum Note (Signed)
Addended by: Dierdre Searles on: 05/10/2023 02:28 PM   Modules accepted: Orders

## 2023-05-10 NOTE — Telephone Encounter (Signed)
Patient called stating that this was sent to the [facility] in [facility] but she is out of town and they will not transfer it.  Can it be resent to:  [facility] - [phone] [address] [place], [place]

## 2023-05-24 ENCOUNTER — Ambulatory Visit: Payer: Medicare Other | Admitting: Bariatrics

## 2023-05-24 ENCOUNTER — Encounter: Payer: Self-pay | Admitting: Bariatrics

## 2023-05-24 VITALS — BP 134/86 | HR 80 | Temp 97.4°F | Ht 66.0 in | Wt 248.0 lb

## 2023-05-24 DIAGNOSIS — Z6841 Body Mass Index (BMI) 40.0 and over, adult: Secondary | ICD-10-CM | POA: Diagnosis not present

## 2023-05-24 DIAGNOSIS — R7303 Prediabetes: Secondary | ICD-10-CM

## 2023-05-24 DIAGNOSIS — I10 Essential (primary) hypertension: Secondary | ICD-10-CM

## 2023-05-24 MED ORDER — SEMAGLUTIDE (2 MG/DOSE) 8 MG/3ML ~~LOC~~ SOPN: 2.0000 mg | PEN_INJECTOR | SUBCUTANEOUS | 0 refills | Status: DC

## 2023-05-24 NOTE — Progress Notes (Signed)
 WEIGHT SUMMARY AND BIOMETRICS  Weight Lost Since Last Visit: 0  Weight Gained Since Last Visit: 0   Vitals Temp: (!) 97.4 F (36.3 C) BP: 134/86 Pulse Rate: 80 SpO2: 99 %   Anthropometric Measurements Height: 5' 6 (1.676 m) Weight: 248 lb (112.5 kg) BMI (Calculated): 40.05 Weight at Last Visit: 248lb Weight Lost Since Last Visit: 0 Weight Gained Since Last Visit: 0 Starting Weight: 275lb Total Weight Loss (lbs): 27 lb (12.2 kg)   Body Composition  Body Fat %: 49.4 % Fat Mass (lbs): 122.8 lbs Muscle Mass (lbs): 119.2 lbs Total Body Water  (lbs): 87.4 lbs Visceral Fat Rating : 17   Other Clinical Data Fasting: no Labs: no Today's Visit #: 14 Starting Date: 07/28/21    OBESITY Michelle Kennedy is here to discuss her progress with her obesity treatment plan along with follow-up of her obesity related diagnoses.    Nutrition Plan: keeping a food journal with goal of 1600 calories and 90 grams of protein daily - 50% adherence.  Current exercise: none  Interim History:  Her weight remains the same. She has not been doing no exercise.  Eating all of the food on the plan., Protein intake is as prescribed, Is not skipping meals, and Denies polyphagia   Pharmacotherapy: Michelle Kennedy is on Ozempic  2 mg SQ weekly Adverse side effects: None Hunger is moderately controlled.  Cravings are moderately controlled.  Assessment/Plan:   1. Prediabetes Prediabetes Last A1c was 5.9  Medication(s):  Ozempic  2 mg SQ weekly Lab Results  Component Value Date   HGBA1C 5.9 02/21/2023   HGBA1C 5.7 (A) 10/03/2022   HGBA1C 5.7 (H) 07/06/2022   HGBA1C 5.7 (A) 02/02/2022   HGBA1C 5.6 10/28/2021   Lab Results  Component Value Date   INSULIN  18.0 07/06/2022   INSULIN  25.6 (H) 07/28/2021   INSULIN  21.8 09/20/2016    Plan: Will minimize all refined carbohydrates both sweets  and starches.  Will work on the plan and exercise.  Consider both aerobic and resistance training.  Will keep protein, water , and fiber intake high.  Increase Polyunsaturated and Monounsaturated fats to increase satiety and encourage weight loss.  Aim for 7 to 9 hours of sleep nightly.   Continue and refill Ozempic  2 mg SQ weekly   Hypertension Hypertension reasonably well controlled.  Medication(s): Amlodipine  5 mg 1 daily  and Lisinopril  20 mg  BP Readings from Last 3 Encounters:  05/24/23 134/86  04/16/23 132/84  04/12/23 117/81   Lab Results  Component Value Date   CREATININE 0.76 02/21/2023   CREATININE 0.95 07/06/2022   CREATININE 0.75 10/28/2021   Lab Results  Component Value Date   GFR 80.37 02/21/2023   GFR 78.91 04/27/2021   GFR 73.66 06/28/2020    Plan: Continue all antihypertensives at current dosages. No added salt. Will keep sodium content to 1,500 mg or less per day.  Morbid Obesity: Current BMI BMI (Calculated): 40.05   Pharmacotherapy Plan Continue and refill  Ozempic  2 mg SQ weekly  Michelle Kennedy is currently in the action stage of change. As such, her goal is to continue with weight loss efforts.  She has agreed to keeping a food journal with goal of 1,600 calories and 90 grams of protein daily.  Exercise goals: Older adults should follow the adult guidelines. When older adults cannot meet the adult guidelines, they should be as physically active as their abilities and conditions will allow.   Behavioral modification strategies: increasing lean protein intake, decreasing simple carbohydrates , no meal skipping, better snacking choices, planning for success, decreasing sodium intake, decrease snacking , avoiding temptations, and mindful eating. She will resume exercise per her surgeons recommendation.   Michelle Kennedy has agreed to follow-up with our clinic in 4 weeks.       Objective:   VITALS: Per patient if applicable, see vitals. GENERAL: Alert and  in no acute distress. CARDIOPULMONARY: No increased WOB. Speaking in clear sentences.  PSYCH: Pleasant and cooperative. Speech normal rate and rhythm. Affect is appropriate. Insight and judgement are appropriate. Attention is focused, linear, and appropriate.  NEURO: Oriented as arrived to appointment on time with no prompting.   Attestation Statements:   This was prepared with the assistance of Engineer, Civil (consulting).  Occasional wrong-word or sound-a-like substitutions may have occurred due to the inherent limitations of voice recognition   Clayborne Daring, DO

## 2023-05-27 NOTE — Progress Notes (Signed)
 Cardiology Office Note   Date:  05/29/2023   ID:  Michelle Kennedy, [place] 07-04-1955, MRN [id number]  PCP:  Daryl Setter, NP  Cardiologist:   Vina Gull, MD    Patient presents for cardiac risk assessment      History of Present Illness: Michelle Kennedy is a 68 y.o. female with a history of HTN, reacitve air dz, obesity   IN Dec  2022 she had a Ca score CT   Ca score was 0    Mild aortic atherosclerosis noted  She was started on Crestor     I saw the pt in March 2023 Since seen she denies CP  Breathing is OK   No palpitations Currently her activity is limited due to back pain   She has a cyst on her spine that is very painful   Has an appt with surgery this week      BP at home 130/70      Current Meds  Medication Sig   albuterol  (PROVENTIL ) (2.5 MG/3ML) 0.083% nebulizer solution Take 3 mLs (2.5 mg total) by nebulization every 6 (six) hours as needed for wheezing or shortness of breath.   albuterol  (VENTOLIN  HFA) 108 (90 Base) MCG/ACT inhaler USE 2 INHALATIONS ORALLY   EVERY 6 HOURS AS NEEDED FORWHEEZING OR SHORTNESS OF   BREATH.   amLODipine  (NORVASC ) 5 MG tablet TAKE 1 TABLET EVERY DAY   betamethasone  valerate ointment (VALISONE ) 0.1 % Apply 1 Application topically 2 (two) times daily.   CALCIUM  PO Take 1 tablet by mouth every evening.   cholecalciferol  (VITAMIN D3) 25 MCG (1000 UNIT) tablet Take 1,000 Units by mouth every evening.    clopidogrel  (PLAVIX ) 75 MG tablet TAKE 1 TABLET EVERY DAY   EPINEPHrine  0.3 mg/0.3 mL IJ SOAJ injection Inject 0.3 mg into the muscle as needed for anaphylaxis.   fluticasone -salmeterol (WIXELA INHUB) 500-50 MCG/ACT AEPB Inhale 1 puff into the lungs in the morning and at bedtime.   furosemide  (LASIX ) 20 MG tablet TAKE 1 TABLET(20 MG) BY MOUTH DAILY AS NEEDED FOR SWELLING   gabapentin  (NEURONTIN ) 100 MG capsule TAKE 2 CAPSULES THREE TIMES DAILY   latanoprost  (XALATAN ) 0.005 % ophthalmic solution 1 drop at bedtime.   levothyroxine   (SYNTHROID ) 150 MCG tablet Take 1 tablet (150 mcg total) by mouth daily.   lisinopril  (ZESTRIL ) 20 MG tablet TAKE 2 TABLETS EVERY DAY   meclizine  (ANTIVERT ) 25 MG tablet Take 1 tablet (25 mg total) by mouth 3 (three) times daily as needed for dizziness.   metFORMIN  (GLUCOPHAGE ) 500 MG tablet TAKE 1 TABLET EVERY DAY WITH BREAKFAST   methocarbamol  (ROBAXIN ) 750 MG tablet TAKE 1 TABLET BY MOUTH EVERY 6 HOURS AS NEEDED FOR MUSCLE SPASM   montelukast  (SINGULAIR ) 10 MG tablet TAKE 1 TABLET AT BEDTIME   Multiple Vitamin (MULTIVITAMIN ADULT) TABS Take 1 tablet by mouth daily.   Multiple Vitamins-Minerals (OCUVITE PO) Take 1 tablet by mouth daily.   Omega-3 Fatty Acids (FISH OIL) 1000 MG CAPS Take 1 capsule by mouth every evening.    ondansetron  (ZOFRAN ) 4 MG tablet Take 1 tablet (4 mg total) by mouth every 8 (eight) hours as needed for nausea or vomiting.   pantoprazole  (PROTONIX ) 40 MG tablet TAKE 1 TABLET EVERY DAY   rosuvastatin  (CRESTOR ) 5 MG tablet Take 1 tablet (5 mg total) by mouth daily.   Semaglutide , 2 MG/DOSE, 8 MG/3ML SOPN Inject 2 mg as directed once a week.   terbinafine  (LAMISIL ) 250 MG tablet Take 1 tablet (  250 mg total) by mouth daily.   traMADol  (ULTRAM ) 50 MG tablet Take 1 tablet (50 mg total) by mouth every 6 (six) hours as needed for moderate pain (pain score 4-6) or severe pain (pain score 7-10).     Allergies:   Bee venom, Betadine  [povidone iodine ], Codeine, Contrast media [iodinated contrast media], Gadolinium derivatives, Iodine , Latex, Lidocaine , Penicillins, Pentazocine lactate, Shellfish allergy, Aspirin, Erythromycin, Symbicort  [budesonide -formoterol  fumarate], Compazine  [prochlorperazine  maleate], and Sulfonamide derivatives   Past Medical History:  Diagnosis Date   Abnormally small mouth    Achilles tendon disorder, right    Allergy    Anemia    Anxiety    Arthritis    hips and knees   Asthma    daily inhaler, prn inhaler and neb.   Asthma due to environmental  allergies    Basal cell carcinoma    nose, s/p mohs.   Breast cancer (HCC) 01/2014   left   Cataract    Chest pain    CHF (congestive heart failure) (HCC)    COPD (chronic obstructive pulmonary disease) (HCC)    Dental bridge present    upper front and lower right   Dental crowns present    x 3   Depression    Esophageal spasm    reports since her chemo and breast surgery  she developed esophageal spasms and reports this is in the past has casue her 02 to desat in the  70s , denies sycnope in relation to this , does report hx of vertigo as well    Family history of anesthesia complication    twin brother aspirated and died on OR table, per pt.   Fibromyalgia    Gallbladder disease    Gallstones    GERD (gastroesophageal reflux disease)    Glaucoma    H/O blood clots    had blood to  PICC line    History of gastric ulcer    as a teenager   History of seizure age 2   as a reaction to Penicillin - no seizures since   History of stomach ulcers    History of thyroid  cancer    s/p thyroidectomy   History of thyroid  cancer    HTN (hypertension)    Hyperlipidemia    Hypothyroidism    Joint pain    Left knee injury    Liver disorder    seen when she had her hysterectomy , reports she was told it was   small lesion ; but asymptomatic    Lymphedema    left arm   Migraines    Multiple food allergies    Obesity    OSA (obstructive sleep apnea) 02/16/2016   CPAP   Palpitations    reports no longer experiences   Personal history of chemotherapy 2015   Personal history of radiation therapy 2015   Left   Prediabetes    Rheumatoid arthritis (HCC)    Sleep apnea    SOB (shortness of breath)    Swelling of both ankles    Tinnitus    UTI (lower urinary tract infection) 02/17/2014   Vertigo    last episode  was 2 weeks ago    Vitamin D  deficiency    Wears contact lenses    left eye only    Wears hearing aid in both ears     Past Surgical History:  Procedure Laterality  Date   ABDOMINAL HYSTERECTOMY  2004   complete   ACHILLES TENDON REPAIR Right  APPENDECTOMY  2004   BREAST EXCISIONAL BIOPSY     BREAST LUMPECTOMY Left    2015   BREAST LUMPECTOMY WITH NEEDLE LOCALIZATION AND AXILLARY SENTINEL LYMPH NODE BX Left 02/23/2014   Procedure: BREAST LUMPECTOMY WITH NEEDLE LOCALIZATION AND AXILLARY SENTINEL LYMPH NODE BIOPSY;  Surgeon: Morene Olives, MD;  Location: [facility];  Service: General;  Laterality: Left;   BREAST SURGERY  2011   left breast biposy   CHOLECYSTECTOMY  1990   COLONOSCOPY W/ POLYPECTOMY  06/2009   EYE SURGERY Right 2013   exc. warts from underneath eyelid   INCONTINENCE SURGERY  2004   KNEE ARTHROSCOPY Bilateral    x 6 each knee   LIGAMENT REPAIR Right    thumb/wrist   ORIF TOE FRACTURE Right    great toe   PORTACATH PLACEMENT N/A 03/24/2014   Procedure: INSERTION PORT-A-CATH;  Surgeon: Morene Olives, MD;  Location: [facility];  Service: General;  Laterality: N/A;; removed    THYROIDECTOMY  2000   TONSILLECTOMY AND ADENOIDECTOMY  2000   TOTAL KNEE ARTHROPLASTY Left 12/03/2017   Procedure: LEFT TOTAL KNEE ARTHROPLASTY;  Surgeon: Melodi Lerner, MD;  Location: [facility];  Service: Orthopedics;  Laterality: Left;   TUBAL LIGATION  1981   TUMOR EXCISION     from thoracic spine     Social History:  The patient  reports that she has never smoked. She has never used smokeless tobacco. She reports that she does not drink alcohol and does not use drugs.   Family History:  The patient's family history includes Alcohol abuse in her father and mother; Alcoholism in her father and mother; Anxiety disorder in her mother; Arthritis in her maternal grandmother and mother; Asthma in an other family member; Bipolar disorder in her mother; Breast cancer in her maternal aunt and sister; Breast cancer (age of onset: 22) in her mother; COPD in an other family member; Cancer in her mother; Cystic kidney disease in her  father; Depression in her mother; Diabetes in her father and paternal grandmother; Drug abuse in her mother; Eating disorder in her mother; Heart disease in an other family member; Hyperlipidemia in her father and mother; Hypertension in her father and mother; Kidney disease in her father; Liver disease in her father; Lung cancer in her father and paternal aunt; Lung cancer (age of onset: 50) in her mother; Obesity in her mother; Other in her sister; Stroke in her mother; Thyroid  cancer in her brother and paternal aunt; Thyroid  cancer (age of onset: 20) in her sister; Thyroid  disease in her father.    ROS:  Please see the history of present illness. All other systems are reviewed and  Negative to the above problem except as noted.    PHYSICAL EXAM: VS:  BP 138/82 (BP Location: Left Arm, Patient Position: Sitting, Cuff Size: Large)   Pulse 78   Resp 16   Ht 5' 6 (1.676 m)   Wt 254 lb 9.6 oz (115.5 kg)   SpO2 98%   BMI 41.09 kg/m   GEN: Morbidly obse 68 yo  in no acute distress  HEENT: normal  Neck: no JVD, carotid bruit Cardiac: RRR; I-II/VI systolic murmur at base    No LE edema  Respiratory:  clear to auscultation GI: soft,  MS: no deformity Moving all extremities      EKG:  EKG is ordered today.  NSR 75 bpm    Lipid Panel    Component Value Date/Time   CHOL  170 07/06/2022 1034   TRIG 85 07/06/2022 1034   HDL 73 07/06/2022 1034   CHOLHDL 3 06/28/2020 1315   VLDL 31.4 06/28/2020 1315   LDLCALC 81 07/06/2022 1034   LDLDIRECT 117.0 02/26/2019 1345      Wt Readings from Last 3 Encounters:  05/29/23 254 lb 9.6 oz (115.5 kg)  05/24/23 248 lb (112.5 kg)  04/16/23 251 lb (113.9 kg)      ASSESSMENT AND PLAN:  1  HTN  BP is a little high today   She is in pain from back    Follow at home   Goal 110s to 130/    Call if over   2  HL  Will get lipomed panel today    3  Atherosclerosis  Mild on CT scan in 2022 (aorta)  Rx risk factors   4  Morbid obesity  Reviewed diet   Cut back carbs    Says she will get back to activity after back taken care of    Follow up next November   Sooner for problems   Current medicines are reviewed at length with the patient today.  The patient does not have concerns regarding medicines.  Signed, Vina Gull, MD  05/29/2023 9:22 AM    [facility] [address] [place], [place], [place]  [address] Phone: [phone]; Fax: [phone]

## 2023-05-29 ENCOUNTER — Ambulatory Visit: Payer: Medicare Other | Attending: Internal Medicine | Admitting: Internal Medicine

## 2023-05-29 ENCOUNTER — Encounter: Payer: Self-pay | Admitting: Internal Medicine

## 2023-05-29 VITALS — BP 138/82 | HR 78 | Resp 16 | Ht 66.0 in | Wt 254.6 lb

## 2023-05-29 DIAGNOSIS — E782 Mixed hyperlipidemia: Secondary | ICD-10-CM

## 2023-05-29 NOTE — Patient Instructions (Signed)
 Medication Instructions:   *If you need a refill on your cardiac medications before your next appointment, please call your pharmacy*   Lab Work: Nmr and apo b today  If you have labs (blood work) drawn today and your tests are completely normal, you will receive your results only by: MyChart Message (if you have MyChart) OR A paper copy in the mail If you have any lab test that is abnormal or we need to change your treatment, we will call you to review the results.   Testing/Procedures:    Follow-Up: At [facility], you and your health needs are our priority.  As part of our continuing mission to provide you with exceptional heart care, we have created designated Provider Care Teams.  These Care Teams include your primary Cardiologist (physician) and Advanced Practice Providers (APPs -  Physician Assistants and Nurse Practitioners) who all work together to provide you with the care you need, when you need it.  We recommend signing up for the patient portal called MyChart.  Sign up information is provided on this After Visit Summary.  MyChart is used to connect with patients for Virtual Visits (Telemedicine).  Patients are able to view lab/test results, encounter notes, upcoming appointments, etc.  Non-urgent messages can be sent to your provider as well.   To learn more about what you can do with MyChart, go to [email or web address].    Your next appointment:  NOV 2026

## 2023-05-30 LAB — NMR, LIPOPROFILE
Cholesterol, Total: 175 mg/dL (ref 100–199)
HDL Particle Number: 43.4 umol/L (ref >=30.5)
HDL-C: 67 mg/dL (ref >39)
LDL Particle Number: 970 nmol/L (ref <1000)
LDL Size: 20.6 nmol (ref >20.5)
LDL-C (NIH Calc): 87 mg/dL (ref 0–99)
LP-IR Score: 33 (ref <=45)
Small LDL Particle Number: 549 nmol/L — ABNORMAL HIGH (ref <=527)
Triglycerides: 122 mg/dL (ref 0–149)

## 2023-05-30 LAB — APOLIPOPROTEIN B: Apolipoprotein B: 79 mg/dL (ref <90)

## 2023-05-31 ENCOUNTER — Other Ambulatory Visit (INDEPENDENT_AMBULATORY_CARE_PROVIDER_SITE_OTHER): Payer: Self-pay

## 2023-05-31 ENCOUNTER — Ambulatory Visit (INDEPENDENT_AMBULATORY_CARE_PROVIDER_SITE_OTHER): Payer: Medicare Other | Admitting: Orthopedic Surgery

## 2023-05-31 VITALS — BP 131/84 | HR 75 | Ht 66.0 in | Wt 255.0 lb

## 2023-05-31 DIAGNOSIS — M545 Low back pain, unspecified: Secondary | ICD-10-CM | POA: Diagnosis not present

## 2023-05-31 DIAGNOSIS — G8929 Other chronic pain: Secondary | ICD-10-CM | POA: Diagnosis not present

## 2023-05-31 MED ORDER — METHOCARBAMOL 750 MG PO TABS: 750.0000 mg | ORAL_TABLET | Freq: Four times a day (QID) | ORAL | 0 refills | Status: DC | PRN

## 2023-05-31 MED ORDER — MELOXICAM 7.5 MG PO TABS: 7.5000 mg | ORAL_TABLET | Freq: Every day | ORAL | 0 refills | Status: DC

## 2023-05-31 MED ORDER — TRAMADOL HCL 50 MG PO TABS: 50.0000 mg | ORAL_TABLET | Freq: Four times a day (QID) | ORAL | 0 refills | Status: AC | PRN

## 2023-05-31 NOTE — Progress Notes (Addendum)
 Orthopedic Spine Surgery Office Note  Assessment: Patient is a 68 y.o. female with low back pain that radiates into her bilateral lower extremities. Has a facet cyst causing central and lateral recess stenosis at L4/5 causing lumbar radiculopathy   Plan: -Explained that initially conservative treatment is tried as a significant number of patients may experience relief with these treatment modalities. Discussed that the conservative treatments include:  -activity modification  -physical therapy  -over the counter pain medications  -medrol  dosepak  -lumbar steroid injections -Patient has tried PT, tylenol , meloxicam , medrol  dosepak, robaxin , lumbar steroid injection, tramadol    -Patient has tried multiple conservative treatments without any relief so discussed surgery as a treatment option. After this discussion, patient elected to proceed -Patient will next be seen at date of surgery  The patient has symptoms consistent with lumbar stenosis. The patient's symptoms were not improving with conservative treatment so operative management was discussed in the form of L4/5 laminectomy and facet cyst excision. The risks including but not limited to iatrogenic instability, facet cyst recurrence, dural tear, nerve root injury, paralysis, persistent pain, infection, bleeding, heart attack, death, fracture, blindness, and need for additional procedures were discussed with the patient. The benefit of the surgery would be improvement in the patient's radiating leg pain. I explained that back pain relief is not the goal of the surgery and it is not reliably alleviated with this surgery. The alternatives to surgical management were covered with the patient and included continued monitoring, physical therapy, over-the-counter pain medications, ambulatory aids, repeat injections, and activity modification. All the patient's questions were answered to her satisfaction. After this discussion, the patient expressed  understanding and elected to proceed with surgical intervention.     ___________________________________________________________________________   History:  Patient is a 68 y.o. female who presents today for lumbar spine. Patient has a history of chronic low back pain, but her pain has gotten acutely worse within the last 3 months. She does not recall any trauma or injury that preceded the onset of this worsening pain. Pain is felt in her low back and radiates into her bilateral buttocks and intro her posterior and lateral thighs. It sometimes radiates past the knees on the lateral aspect of the legs. The left leg is more painful than the right. Pain is felt with activity and at rest. She has noticed at any treatment that has provided her with significant relief so far.    Weakness: Yes, lower back feels weaker. No other weakness noted Symptoms of imbalance: Denies Paresthesias and numbness: Denies Bowel or bladder incontinence: Yes, has a long history of urinary incontinence. Has had a bladder stimulator placed. No recent changes in urinary habits. No bowel incontinence or changes in bowel habits. Saddle anesthesia: Denies  Review of systems: Denies fevers and chills, night sweats, unexplained weight loss. Has had pain that wakes her at night. Has a history of basal carcinoma which has been excised. Also, history of breast cancer which is in remission  Past medical history: HLD CAD HTN DM (last A1c was 5.9 on 02/21/2023) GERD Fibromyalgia Migraines Depression/Anxiety Chronic pain COPD OSA Basal cell carcinoma s/p excision Breast cancer in remission CHF Glaucoma  Allergies: iodine , gadolinium, latex, codeine, pentazocine, aspirin, erythromycin, symbicort , compazine , sulfa drugs  Past surgical history:  Bladder stimulator placement Hysterectomy Basal cell carcinoma excision Achilles tendon repair Breat lumpectomy Cholecystectomy Knee arthroscopy Thyroidectomy Great toe  fracture ORIF Tonsillectomy Tubal ligation  Social history: Denies use of nicotine product (smoking, vaping, patches, smokeless) Alcohol use: denies  Denies recreational drug use   Physical Exam:  BMI of 39.5  General: no acute distress, appears stated age Neurologic: alert, answering questions appropriately, following commands Respiratory: unlabored breathing on room air, symmetric chest rise Psychiatric: appropriate affect, normal cadence to speech   MSK (spine):  -Strength exam      Left  Right EHL    5/5  5/5 TA    5/5  5/5 GSC    5/5  5/5 Knee extension  5/5  5/5 Hip flexion   5/5  5/5  -Sensory exam    Sensation intact to light touch in L2-S1 nerve distributions of bilateral lower extremities  -Achilles DTR: 1/4 on the left, 1/4 on the right -Patellar tendon DTR: 1/4 on the left, 1/4 on the right  -Straight leg raise: negative bilaterally -Femoral nerve stretch test: negative bilaterally -Clonus: no beats bilaterally  -Left hip exam: no pain through range of motion, negative stinchfield, negative faber -Right hip exam: no pain through range of motion, negative stinchfield, negative faber  Imaging: XRs of the lumbar spine from 05/31/2023 were independently reviewed and interpreted, showing disc height loss at L5/S1. No other significant degenerative changes seen. No evidence of instability on flexion/extension views. No fracture or dislocation seen.   MRI of the lumbar spine from 03/29/2023 was independently reviewed and interpreted, showing left sided facet cyst at L4/5 causing lateral recess stenosis on the left and central stenosis. No other significant stenosis seen.    Patient name: Michelle Kennedy Patient MRN: [id number] Date of visit: 05/31/23   Pre-operative Scores  ODI: 65 VAS back: 8 VAS leg: 7

## 2023-06-04 ENCOUNTER — Other Ambulatory Visit (HOSPITAL_COMMUNITY): Payer: Self-pay

## 2023-06-04 ENCOUNTER — Telehealth: Payer: Self-pay

## 2023-06-04 NOTE — Telephone Encounter (Signed)
 PA started for Ozempic via covermymeds.

## 2023-06-05 ENCOUNTER — Ambulatory Visit: Payer: Medicare Other | Admitting: Family

## 2023-06-05 ENCOUNTER — Ambulatory Visit (HOSPITAL_BASED_OUTPATIENT_CLINIC_OR_DEPARTMENT_OTHER)
Admission: RE | Admit: 2023-06-05 | Discharge: 2023-06-05 | Disposition: A | Discharge status: Home / Self Care | Payer: Medicare Other | Source: Ambulatory Visit | Attending: Family | Admitting: Family

## 2023-06-05 VITALS — BP 144/73 | HR 74 | Temp 98.0°F | Resp 16 | Ht 66.0 in | Wt 253.0 lb

## 2023-06-05 DIAGNOSIS — I10 Essential (primary) hypertension: Secondary | ICD-10-CM

## 2023-06-05 DIAGNOSIS — Z01818 Encounter for other preprocedural examination: Secondary | ICD-10-CM

## 2023-06-05 MED ORDER — GABAPENTIN 300 MG PO CAPS: 300.0000 mg | ORAL_CAPSULE | Freq: Three times a day (TID) | ORAL | 1 refills | Status: DC

## 2023-06-05 NOTE — Assessment & Plan Note (Signed)
 BP up slightly today. likely secondary to pain. -Monitor blood pressure, reassess after surgery and pain resolution. -continue lisinopril.

## 2023-06-05 NOTE — Patient Instructions (Signed)
 VISIT SUMMARY:  You visited today due to severe back pain caused by a lumbar cyst, which is affecting your mobility and sleep. We discussed your upcoming surgery and made some adjustments to your medication to help manage your pain and blood pressure.  YOUR PLAN:  -LUMBAR SPINAL STENOSIS WITH CYST: Lumbar spinal stenosis is a condition where the spaces within your spine narrow, which can put pressure on the nerves traveling through the spine. This is causing your severe pain that radiates down your legs and affects your mobility and sleep. You are scheduled for an L4-L5 lumbar laminectomy at the end of January to relieve this pressure. In the meantime, we have increased your Gabapentin  to 300mg  capsules, up to three times daily, to help control your pain. We also ordered a chest X-ray and PT INR test to prepare for your surgery.  -HYPERTENSION: Hypertension, or high blood pressure, is when the force of the blood against your artery walls is too high. Your slightly elevated blood pressure is likely due to the pain you are experiencing. We will monitor your blood pressure and reassess it after your surgery and once your pain is better managed.  -GENERAL HEALTH MAINTENANCE: Continue taking your current medications and managing your other chronic conditions as usual. We will follow up after your surgery to assess your recovery and make any necessary adjustments to your treatment plan.  INSTRUCTIONS:  Please follow up after your surgery to assess your recovery and adjust your treatment plan as necessary. Continue monitoring your blood pressure and take the prescribed medications as directed. Ensure you complete the chest X-ray and PT INR test before your surgery.

## 2023-06-05 NOTE — Assessment & Plan Note (Signed)
 Lumbar Spinal Stenosis with Cyst Severe pain radiating down the legs, limiting mobility and sleep. Scheduled for L4-L5 lumbar laminectomy at the end of January. She states that she already saw her cardiologist for cardiac clearance. -Increase Gabapentin  to 300mg  capsules, up to three times daily for pain control. -Order chest X-ray and PT INR and labs as ordered for pre-op evaluation.

## 2023-06-05 NOTE — Progress Notes (Signed)
 Subjective:     Patient ID: Michelle Kennedy, female    DOB: March 13, 1956, 68 y.o.   MRN: [id number]  Chief Complaint  Patient presents with   Pre-op Exam    HPI  Discussed the use of AI scribe software for clinical note transcription with the patient, who gave verbal consent to proceed.  History of Present Illness   The patient, with a history of asthma and recent severe lower back pain, presents for a pre-op evaluation for planned L4-L5 lumbar laminectomy. She reports that the pain radiates down the legs and up to the middle of the back, causing difficulty in walking and sleeping. The patient reports sleeping for only three to four hours due to the pain. The patient has tried physical therapy and steroid injections, but the pain has not improved. The patient has already received clearance from her cardiologist for the surgery. The patient is currently taking tramadol  for the pain and gabapentin , which she takes four times a day. The patient also reports a slight increase in blood pressure, which she attributes to the pain.          Health Maintenance Due  Topic Date Due   COVID-19 Vaccine (8 - 2024-25 season) 04/09/2023    Past Medical History:  Diagnosis Date   Abnormally small mouth    Achilles tendon disorder, right    Allergy    Anemia    Anxiety    Arthritis    hips and knees   Asthma    daily inhaler, prn inhaler and neb.   Asthma due to environmental allergies    Basal cell carcinoma    nose, s/p mohs.   Breast cancer (HCC) 01/2014   left   Cataract    Chest pain    CHF (congestive heart failure) (HCC)    COPD (chronic obstructive pulmonary disease) (HCC)    Dental bridge present    upper front and lower right   Dental crowns present    x 3   Depression    Esophageal spasm    reports since her chemo and breast surgery  she developed esophageal spasms and reports this is in the past has casue her 02 to desat in the  70s , denies sycnope in relation to  this , does report hx of vertigo as well    Family history of anesthesia complication    twin brother aspirated and died on OR table, per pt.   Fibromyalgia    Gallbladder disease    Gallstones    GERD (gastroesophageal reflux disease)    Glaucoma    H/O blood clots    had blood to  PICC line    History of gastric ulcer    as a teenager   History of seizure age 31   as a reaction to Penicillin - no seizures since   History of stomach ulcers    History of thyroid  cancer    s/p thyroidectomy   History of thyroid  cancer    HTN (hypertension)    Hyperlipidemia    Hypothyroidism    Joint pain    Left knee injury    Liver disorder    seen when she had her hysterectomy , reports she was told it was   small lesion ; but asymptomatic    Lymphedema    left arm   Migraines    Multiple food allergies    Obesity    OSA (obstructive sleep apnea) 02/16/2016   CPAP  Palpitations    reports no longer experiences   Personal history of chemotherapy 2015   Personal history of radiation therapy 2015   Left   Prediabetes    Rheumatoid arthritis (HCC)    Sleep apnea    SOB (shortness of breath)    Swelling of both ankles    Tinnitus    UTI (lower urinary tract infection) 02/17/2014   Vertigo    last episode  was 2 weeks ago    Vitamin D  deficiency    Wears contact lenses    left eye only    Wears hearing aid in both ears     Past Surgical History:  Procedure Laterality Date   ABDOMINAL HYSTERECTOMY  2004   complete   ACHILLES TENDON REPAIR Right    APPENDECTOMY  2004   BREAST EXCISIONAL BIOPSY     BREAST LUMPECTOMY Left    2015   BREAST LUMPECTOMY WITH NEEDLE LOCALIZATION AND AXILLARY SENTINEL LYMPH NODE BX Left 02/23/2014   Procedure: BREAST LUMPECTOMY WITH NEEDLE LOCALIZATION AND AXILLARY SENTINEL LYMPH NODE BIOPSY;  Surgeon: Morene Olives, MD;  Location: [facility];  Service: General;  Laterality: Left;   BREAST SURGERY  2011   left breast biposy    CHOLECYSTECTOMY  1990   COLONOSCOPY W/ POLYPECTOMY  06/2009   EYE SURGERY Right 2013   exc. warts from underneath eyelid   INCONTINENCE SURGERY  2004   KNEE ARTHROSCOPY Bilateral    x 6 each knee   LIGAMENT REPAIR Right    thumb/wrist   ORIF TOE FRACTURE Right    great toe   PORTACATH PLACEMENT N/A 03/24/2014   Procedure: INSERTION PORT-A-CATH;  Surgeon: Morene Olives, MD;  Location: [facility];  Service: General;  Laterality: N/A;; removed    THYROIDECTOMY  2000   TONSILLECTOMY AND ADENOIDECTOMY  2000   TOTAL KNEE ARTHROPLASTY Left 12/03/2017   Procedure: LEFT TOTAL KNEE ARTHROPLASTY;  Surgeon: Melodi Lerner, MD;  Location: [facility];  Service: Orthopedics;  Laterality: Left;   TUBAL LIGATION  1981   TUMOR EXCISION     from thoracic spine    Family History  Problem Relation Age of Onset   Alcohol abuse Mother    Arthritis Mother    Hypertension Mother    Bipolar disorder Mother    Breast cancer Mother 59       unconfirmed   Lung cancer Mother 98       smoker   Hyperlipidemia Mother    Stroke Mother    Cancer Mother    Depression Mother    Anxiety disorder Mother    Alcoholism Mother    Drug abuse Mother    Eating disorder Mother    Obesity Mother    Alcohol abuse Father    Hyperlipidemia Father    Kidney disease Father    Diabetes Father    Hypertension Father    Cystic kidney disease Father    Thyroid  disease Father    Liver disease Father    Alcoholism Father    Lung cancer Father    Thyroid  cancer Sister 69       type?; currently 22   Other Sister        ovarian tumor @ 41; TAH/BSO   Breast cancer Sister    Thyroid  cancer Brother        dx 30s; currently 23   Breast cancer Maternal Aunt        dx 60s; deceased 67  Thyroid  cancer Paternal Aunt        All 3 paternal aunts with thyroid  ca in 30s/40s   Lung cancer Paternal Aunt        2 of 3 paternal aunts with lung cancer   Arthritis Maternal Grandmother    Diabetes Paternal  Grandmother    Heart disease Other    COPD Other    Asthma Other     Social History   Socioeconomic History   Marital status: Married    Spouse name: Jason   Number of children: 2   Years of education: Not on file   Highest education level: Associate degree: occupational, scientist, product/process development, or vocational program  Occupational History   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   Social Drivers of Corporate Investment Banker Strain: Low Risk  (06/05/2023)   Overall Financial Resource Strain (CARDIA)    Difficulty of Paying Living Expenses: Not hard at all  Food Insecurity: No Food Insecurity (06/05/2023)   Hunger Vital Sign    Worried About Running Out of Food in the Last Year: Never true    Ran Out of Food in the Last Year: Never true  Transportation Needs: No Transportation Needs (06/05/2023)   PRAPARE - Administrator, Civil Service (Medical): No    Lack of Transportation (Non-Medical): No  Physical Activity: Inactive (06/05/2023)   Exercise Vital Sign    Days of Exercise per Week: 0 days    Minutes of Exercise per Session: 40 min  Stress: No Stress Concern Present (06/05/2023)   [employer] of Occupational Health - Occupational Stress Questionnaire    Feeling of Stress : Not at all  Social Connections: Socially Integrated (06/05/2023)   Social Connection and Isolation Panel [NHANES]    Frequency of Communication with Friends and Family: More than three times  a week    Frequency of Social Gatherings with Friends and Family: Once a week    Attends Religious Services: More than 4 times per year    Active Member of [employer] or Organizations: Yes    Attends Engineer, Structural: More than 4 times per year    Marital Status: Married  Catering Manager Violence: Not At Risk (08/24/2022)   Humiliation, Afraid, Rape, and Kick questionnaire    Fear of Current or Ex-Partner: No    Emotionally Abused: No    Physically Abused: No    Sexually Abused: No    Outpatient Medications Prior to Visit  Medication Sig Dispense Refill   albuterol  (PROVENTIL ) (2.5 MG/3ML) 0.083% nebulizer solution Take 3 mLs (2.5 mg total) by nebulization every 6 (six) hours as needed for wheezing or shortness of breath. 75 mL 5   albuterol  (VENTOLIN  HFA) 108 (90 Base) MCG/ACT inhaler USE 2 INHALATIONS ORALLY   EVERY 6 HOURS AS NEEDED FORWHEEZING OR SHORTNESS OF   BREATH.  54 g 1   amLODipine  (NORVASC ) 5 MG tablet TAKE 1 TABLET EVERY DAY 90 tablet 1   betamethasone  valerate ointment (VALISONE ) 0.1 % Apply 1 Application topically 2 (two) times daily. 30 g 2   CALCIUM  PO Take 1 tablet by mouth every evening.     cholecalciferol  (VITAMIN D3) 25 MCG (1000 UNIT) tablet Take 1,000 Units by mouth every evening.      clopidogrel  (PLAVIX ) 75 MG tablet TAKE 1 TABLET EVERY DAY 90 tablet 3   EPINEPHrine  0.3 mg/0.3 mL IJ SOAJ injection Inject 0.3 mg into the muscle as needed for anaphylaxis. 2 each 1   fluticasone -salmeterol (WIXELA INHUB) 500-50 MCG/ACT AEPB Inhale 1 puff into the lungs in the morning and at bedtime. 60 each 2   furosemide  (LASIX ) 20 MG tablet TAKE 1 TABLET(20 MG) BY MOUTH DAILY AS NEEDED FOR SWELLING 30 tablet 3   latanoprost  (XALATAN ) 0.005 % ophthalmic solution 1 drop at bedtime.     levothyroxine  (SYNTHROID ) 150 MCG tablet Take 1 tablet (150 mcg total) by mouth daily. 90 tablet 3   lisinopril  (ZESTRIL ) 20 MG tablet TAKE 2 TABLETS EVERY DAY 180 tablet 1   meclizine   (ANTIVERT ) 25 MG tablet Take 1 tablet (25 mg total) by mouth 3 (three) times daily as needed for dizziness. 30 tablet 0   meloxicam  (MOBIC ) 7.5 MG tablet Take 1 tablet (7.5 mg total) by mouth daily. 30 tablet 0   metFORMIN  (GLUCOPHAGE ) 500 MG tablet TAKE 1 TABLET EVERY DAY WITH BREAKFAST 90 tablet 3   methocarbamol  (ROBAXIN ) 750 MG tablet Take 1 tablet (750 mg total) by mouth every 6 (six) hours as needed (pain, muscle spasms). 90 tablet 0   montelukast  (SINGULAIR ) 10 MG tablet TAKE 1 TABLET AT BEDTIME 90 tablet 1   Multiple Vitamin (MULTIVITAMIN ADULT) TABS Take 1 tablet by mouth daily.     Multiple Vitamins-Minerals (OCUVITE PO) Take 1 tablet by mouth daily.     Omega-3 Fatty Acids (FISH OIL) 1000 MG CAPS Take 1 capsule by mouth every evening.      ondansetron  (ZOFRAN ) 4 MG tablet Take 1 tablet (4 mg total) by mouth every 8 (eight) hours as needed for nausea or vomiting. 20 tablet 0   pantoprazole  (PROTONIX ) 40 MG tablet TAKE 1 TABLET EVERY DAY 90 tablet 3   rosuvastatin  (CRESTOR ) 5 MG tablet Take 1 tablet (5 mg total) by mouth daily. 90 tablet 1   Semaglutide , 2 MG/DOSE, 8 MG/3ML SOPN Inject 2 mg as directed once a week. 3 mL 0   terbinafine  (LAMISIL ) 250 MG tablet Take 1 tablet (250 mg total) by mouth daily. 90 tablet 0   traMADol  (ULTRAM ) 50 MG tablet Take 1 tablet (50 mg total) by mouth every 6 (six) hours as needed for up to 5 days for moderate pain (pain score 4-6) or severe pain (pain score 7-10). 20 tablet 0   gabapentin  (NEURONTIN ) 100 MG capsule TAKE 2 CAPSULES THREE TIMES DAILY 540 capsule 3   No facility-administered medications prior to visit.    Allergies  Allergen Reactions   Bee Venom Anaphylaxis   Betadine  [Povidone Iodine ] Anaphylaxis    Rash. Breathing problems.    Codeine Shortness Of Breath and Rash   Contrast Media [Iodinated Contrast Media] Shortness Of Breath   Gadolinium Derivatives Hives, Rash, Shortness Of Breath and Swelling    my heart stopped beating     Iodine  Other (See Comments)    CARDIAC ARREST   Latex Anaphylaxis and Rash  Lidocaine  Shortness Of Breath and Swelling    SWELLING OF MOUTH AND THROAT, low BP   Penicillins Shortness Of Breath, Rash and Other (See Comments)    SEIZURE Did it involve swelling of the face/tongue/throat, SOB, or low BP? no Did it involve sudden or severe rash/hives, skin peeling, or any reaction on the inside of your mouth or nose? yes Did you need to seek medical attention at a hospital or doctor's office? yes When did it last happen?      2010 If all above answers are "NO", may proceed with cephalosporin use.    Pentazocine Lactate Shortness Of Breath and Rash   Shellfish Allergy Shortness Of Breath and Rash   Aspirin Rash and Other (See Comments)    GI UPSET   Erythromycin Swelling and Rash    SWELLING OF JOINTS   Symbicort  [Budesonide -Formoterol  Fumarate] Other (See Comments)    BURNING OF TONGUE AND LIPS   Compazine  [Prochlorperazine  Maleate] Rash    Rash on face,chest, arms, back   Sulfonamide Derivatives Rash    Review of Systems  Constitutional:  Negative for weight loss.  HENT:  Negative for congestion.   Eyes:  Negative for blurred vision.  Respiratory:  Negative for cough and shortness of breath.   Cardiovascular:  Negative for chest pain.  Gastrointestinal:  Negative for constipation and diarrhea.  Genitourinary:  Negative for dysuria and frequency.  Musculoskeletal:  Positive for back pain.  Neurological:  Negative for headaches.  Psychiatric/Behavioral:         Denies depression/anxiety       Objective:    Physical Exam Constitutional:      General: She is not in acute distress.    Appearance: Normal appearance. She is well-developed.  HENT:     Head: Normocephalic and atraumatic.     Right Ear: Tympanic membrane, ear canal and external ear normal.     Left Ear: Tympanic membrane, ear canal and external ear normal.     Mouth/Throat:     Mouth: Mucous membranes are  moist.     Pharynx: Oropharynx is clear. No oropharyngeal exudate or uvula swelling.  Eyes:     General: No scleral icterus. Neck:     Thyroid : No thyromegaly.  Cardiovascular:     Rate and Rhythm: Normal rate and regular rhythm.     Heart sounds: Normal heart sounds. No murmur heard. Pulmonary:     Effort: Pulmonary effort is normal. No respiratory distress.     Breath sounds: Normal breath sounds. No wheezing.  Musculoskeletal:     Cervical back: Neck supple.  Lymphadenopathy:     Cervical: No cervical adenopathy.  Skin:    General: Skin is warm and dry.  Neurological:     Mental Status: She is alert and oriented to person, place, and time.  Psychiatric:        Mood and Affect: Mood normal.        Behavior: Behavior normal.        Thought Content: Thought content normal.        Judgment: Judgment normal.      BP (!) 144/73   Pulse 74   Temp 98 F (36.7 C) (Oral)   Resp 16   Ht 5' 6 (1.676 m)   Wt 253 lb (114.8 kg)   SpO2 98%   BMI 40.84 kg/m  Wt Readings from Last 3 Encounters:  06/05/23 253 lb (114.8 kg)  05/31/23 255 lb (115.7 kg)  05/29/23 254 lb 9.6  oz (115.5 kg)       Assessment & Plan:   Problem List Items Addressed This Visit       Unprioritized   Pre-op evaluation - Primary   Lumbar Spinal Stenosis with Cyst Severe pain radiating down the legs, limiting mobility and sleep. Scheduled for L4-L5 lumbar laminectomy at the end of January. She states that she already saw her cardiologist for cardiac clearance. -Increase Gabapentin  to 300mg  capsules, up to three times daily for pain control. -Order chest X-ray and PT INR and labs as ordered for pre-op evaluation.        Relevant Orders   Comp Met (CMET)   CBC w/Diff   DG Chest 2 View (Completed)   Protime-INR   Protime-INR   Essential hypertension   BP up slightly today. likely secondary to pain. -Monitor blood pressure, reassess after surgery and pain resolution. -continue lisinopril .         I have discontinued Kynadi D. Manter [name]'s gabapentin . I am also having her start on gabapentin . Additionally, I am having her maintain her CALCIUM  PO, cholecalciferol , Fish Oil, Multivitamin Adult, Multiple Vitamins-Minerals (OCUVITE PO), latanoprost , furosemide , albuterol , EPINEPHrine , meclizine , betamethasone  valerate ointment, fluticasone -salmeterol, pantoprazole , levothyroxine , albuterol , metFORMIN , ondansetron , clopidogrel , rosuvastatin , terbinafine , amLODipine , montelukast , lisinopril , Semaglutide  (2 MG/DOSE), traMADol , meloxicam , and methocarbamol .  Meds ordered this encounter  Medications   gabapentin  (NEURONTIN ) 300 MG capsule    Sig: Take 1 capsule (300 mg total) by mouth 3 (three) times daily.    Dispense:  90 capsule    Refill:  1    Supervising Provider:   DOMENICA BLACKBIRD A [[id number]]

## 2023-06-06 ENCOUNTER — Telehealth (INDEPENDENT_AMBULATORY_CARE_PROVIDER_SITE_OTHER): Payer: Self-pay | Admitting: Bariatrics

## 2023-06-06 LAB — CBC WITH DIFFERENTIAL/PLATELET
Basophils Absolute: 0.1 10*3/uL (ref 0.0–0.1)
Basophils Relative: 0.9 % (ref 0.0–3.0)
Eosinophils Absolute: 0.1 10*3/uL (ref 0.0–0.7)
Eosinophils Relative: 1.6 % (ref 0.0–5.0)
HCT: 44.4 % (ref 36.0–46.0)
Hemoglobin: 14.2 g/dL (ref 12.0–15.0)
Lymphocytes Relative: 24.3 % (ref 12.0–46.0)
Lymphs Abs: 1.7 10*3/uL (ref 0.7–4.0)
MCHC: 32 g/dL (ref 30.0–36.0)
MCV: 78.2 fL (ref 78.0–100.0)
Monocytes Absolute: 0.5 10*3/uL (ref 0.1–1.0)
Monocytes Relative: 7.3 % (ref 3.0–12.0)
Neutro Abs: 4.6 10*3/uL (ref 1.4–7.7)
Neutrophils Relative %: 65.9 % (ref 43.0–77.0)
Platelets: 323 10*3/uL (ref 150.0–400.0)
RBC: 5.68 Mil/uL — ABNORMAL HIGH (ref 3.87–5.11)
RDW: 16.1 % — ABNORMAL HIGH (ref 11.5–15.5)
WBC: 7 10*3/uL (ref 4.0–10.5)

## 2023-06-06 LAB — PROTIME-INR
INR: 0.9 {ratio} (ref 0.8–1.0)
Prothrombin Time: 10 s (ref 9.6–13.1)

## 2023-06-06 LAB — COMPREHENSIVE METABOLIC PANEL
ALT: 19 U/L (ref 0–35)
AST: 15 U/L (ref 0–37)
Albumin: 4.4 g/dL (ref 3.5–5.2)
Alkaline Phosphatase: 56 U/L (ref 39–117)
BUN: 20 mg/dL (ref 6–23)
CO2: 27 meq/L (ref 19–32)
Calcium: 9.9 mg/dL (ref 8.4–10.5)
Chloride: 102 meq/L (ref 96–112)
Creatinine, Ser: 0.88 mg/dL (ref 0.40–1.20)
GFR: 67.27 mL/min (ref >60.00)
Glucose, Bld: 95 mg/dL (ref 70–99)
Potassium: 4.4 meq/L (ref 3.5–5.1)
Sodium: 139 meq/L (ref 135–145)
Total Bilirubin: 0.4 mg/dL (ref 0.2–1.2)
Total Protein: 6.6 g/dL (ref 6.0–8.3)

## 2023-06-06 NOTE — Telephone Encounter (Signed)
 Ozempic  denied for as following from insurance:  Drugs when used for anorexia, weight loss, or weight gain are excluded from coverage under Medicare rules

## 2023-06-06 NOTE — Telephone Encounter (Signed)
 A representative from [facility] called and stated the prior authorization for Ozempic  needs to be resubmitted stating the medication is used for diabetes and is medically necessary. The representative stated that it should not say the medication is used for weight loss or weight gain.

## 2023-06-06 NOTE — Telephone Encounter (Signed)
 Have already notified patient previously that she has to wait until follow-up visit to discuss other medication options.

## 2023-06-07 ENCOUNTER — Telehealth: Payer: Self-pay

## 2023-06-07 ENCOUNTER — Telehealth: Payer: Self-pay | Admitting: Orthopedic Surgery

## 2023-06-07 ENCOUNTER — Telehealth: Payer: Self-pay | Admitting: Family

## 2023-06-07 NOTE — Telephone Encounter (Signed)
Patient called stating that she is still waiting to be scheduled for surgery and that now when she stands to long, her back goes numb, but when she lays down it goes away.  Would like a CB at [phone].  Please advise

## 2023-06-07 NOTE — Telephone Encounter (Signed)
-----   Message from Tamsen Meek sent at 06/07/2023 11:48 AM EST ----- It would probably be a good idea to put her on anticoagulation for at least 4 weeks after surgery until she gains mobility. Vinay ----- Message ----- From: Sandford Craze, NP Sent: 06/05/2023   3:34 PM EST To: Serena Croissant, MD  Dear Dr. Pamelia Hoit,  I am working on [name]'s pre-op clearance for upcoming lumbar laminectomy.  I was reviewing her history and see that she had an UE DVT post op previously which was treated with xarelto.  Do you have any special perioperative anticoagulation recommendations for her?   Thank you!  Sandford Craze NP

## 2023-06-07 NOTE — Telephone Encounter (Signed)
Patient called. Says she is having numbness. Would like to know what to do. Cb# [phone]

## 2023-06-07 NOTE — Telephone Encounter (Signed)
Duplicate message. 

## 2023-06-08 ENCOUNTER — Telehealth: Payer: Self-pay | Admitting: Family

## 2023-06-08 NOTE — Telephone Encounter (Signed)
Copied from CRM [id number]. Topic: General - Other >> Jun 08, 2023  4:18 PM Tiffany H wrote: Reason for CRM: Patient called to advise that she will be having surgery. Surgery is pending return of paperwork for pause of Plavix. Please assist. >> Jun 08, 2023  4:21 PM Tiffany H wrote: Please call back Monday to provide a status update.

## 2023-06-08 NOTE — Telephone Encounter (Signed)
See mychart.  

## 2023-06-11 ENCOUNTER — Telehealth: Payer: Self-pay | Admitting: Neurology

## 2023-06-11 ENCOUNTER — Other Ambulatory Visit: Payer: Self-pay

## 2023-06-11 MED ORDER — GABAPENTIN 300 MG PO CAPS: 300.0000 mg | ORAL_CAPSULE | Freq: Three times a day (TID) | ORAL | 1 refills | Status: DC

## 2023-06-11 NOTE — Telephone Encounter (Signed)
Copied from CRM [id number]. Topic: Clinical - Prescription Issue >> Jun 11, 2023  2:27 PM Adaysia C wrote: Reason for CRM: Patients husband(Murray) called on patients behalf to request that the provider resubmit the RX refill for gabapentin (NEURONTIN) 300 MG capsule a 90 day supply to the [facility] delivery pharmacy instead of [facility]. Please follow up with patient if necessary [phone]

## 2023-06-11 NOTE — Telephone Encounter (Signed)
Patient notified of plan and information faxed to surgeon

## 2023-06-11 NOTE — Telephone Encounter (Signed)
I called and advised that we are waiting on all the clearances and insurance to approved the surgery and then they will call her to schedule

## 2023-06-11 NOTE — Telephone Encounter (Signed)
Her Plavix is for stroke prevention. Lovenox will be for blood clot prevention. It should be ok for her to take both together after surgery but I will double check this with Dr. Pamelia Hoit.  Also, she should be off of plavix for a full 5 days prior to surgery- so last dose 6 days before surgery.

## 2023-06-11 NOTE — Telephone Encounter (Signed)
Rx [facility] at [facility] and [facility] . Patient notified

## 2023-06-11 NOTE — Telephone Encounter (Signed)
VOB initiated for Zilretta for RIGHT knee OA.  

## 2023-06-12 ENCOUNTER — Telehealth: Payer: Self-pay

## 2023-06-12 MED ORDER — ENOXAPARIN SODIUM 40 MG/0.4ML IJ SOSY: 40.0000 mg | PREFILLED_SYRINGE | INTRAMUSCULAR | 0 refills | Status: DC

## 2023-06-12 NOTE — Addendum Note (Signed)
Addended by: Sandford Craze on: 06/12/2023 05:13 PM   Modules accepted: Orders

## 2023-06-12 NOTE — Telephone Encounter (Signed)
She will start lovenox 40mg  once daily following surgery and take for 4 weeks. I will send this rx to her pharmacy so she has it on hand.  After surgery, she should remain off of plavix while on lovenox. When she completes the 4 weeks of lovenox she should restart plavix.  I reviewed this plan with Dr. Pamelia Hoit.

## 2023-06-12 NOTE — Telephone Encounter (Signed)
 [employer] [facility] [employer] - Prior Authorization NOT required

## 2023-06-12 NOTE — Telephone Encounter (Signed)
Copied from CRM [id number]. Topic: Clinical - Medication Question >> Jun 12, 2023  2:45 PM [name] wrote: Reason for CRM: Pt called to advise [facility], to start the blood thinner for after her surgery scheduled for  06/21/18 @ 1045am. Pt is requesting a call back at [phone]

## 2023-06-12 NOTE — Telephone Encounter (Signed)
ZILRETTA for RIGHT knee OA OK to schedule on or after 06/12/23  [employer] [facility] [employer]  Primary Insurance: Medicare Co-Pay: n/a Co-Insurance: 20% Deductible: $167 of $257 met  Prior Authorization: NOT required  Secondary Insurance: [employer] Co-Pay: n/a Co-Insurance: Covers Medicare Part B co-insurance Deductible: n/a  Prior Authorization: NOT required   Knee Injection History 11/21/22 - Zilretta RIGHT 03/19/23 - Zilretta RIGHT

## 2023-06-13 NOTE — Telephone Encounter (Signed)
Information and specific instructions given to patient. She was advised levonox rx was sent to her pharmacy. She verbalized understanding.

## 2023-06-13 NOTE — Telephone Encounter (Signed)
Holding until needed by patient.

## 2023-06-18 ENCOUNTER — Telehealth: Payer: Self-pay | Admitting: *Deleted

## 2023-06-18 NOTE — Pre-Procedure Instructions (Signed)
Surgical Instructions   Your procedure is scheduled on Friday, January 31st. Report to [facility] Main Entrance "A" at 10:30 A.M., then check in with the Admitting office. Any questions or running late day of surgery: call [phone]  Questions prior to your surgery date: call [phone], Monday-Friday, 8am-4pm. If you experience any cold or flu symptoms such as cough, fever, chills, shortness of breath, etc. between now and your scheduled surgery, please notify us at the above number.     Remember:  Do not eat after midnight the night before your surgery  You may drink clear liquids until 09:30 AM the morning of your surgery.   Clear liquids allowed are: Water, Non-Citrus Juices (without pulp), Carbonated Beverages, Clear Tea (no milk, honey, etc.), Black Coffee Only (NO MILK, CREAM OR POWDERED CREAMER of any kind), and Gatorade.  Patient Instructions  The night before surgery:  No food after midnight. ONLY clear liquids after midnight   The day of surgery (if you have diabetes): Drink ONE (1) 12 oz G2 given to you in your pre admission testing appointment by 09:30 AM the morning of surgery. Drink in one sitting. Do not sip.  This drink was given to you during your hospital  pre-op appointment visit.  Nothing else to drink after completing the  12 oz bottle of G2.         If you have questions, please contact your surgeon's office.     Take these medicines the morning of surgery with A SIP OF WATER  amLODipine (NORVASC)  fluticasone-salmeterol (WIXELA INHUB)  gabapentin (NEURONTIN)  levothyroxine (SYNTHROID)  pantoprazole (PROTONIX)  rosuvastatin (CRESTOR)   May take these medicines IF NEEDED: albuterol (PROVENTIL)  albuterol (VENTOLIN HFA)- bring inhaler with you on day of surgery EPINEPHrine  meclizine (ANTIVERT)  methocarbamol (ROBAXIN)  ondansetron (ZOFRAN)    Follow your surgeon's instructions on when to stop clopidogrel (PLAVIX).  If no instructions were  given by your surgeon then you will need to call the office to get those instructions.    One week prior to surgery, STOP taking any Aspirin (unless otherwise instructed by your surgeon) Aleve, Naproxen, Ibuprofen, Motrin, Advil, Goody's, BC's, all herbal medications, fish oil, and non-prescription vitamins. This includes meloxicam (MOBIC).  WHAT DO I DO ABOUT MY DIABETES MEDICATION?   Do not take metFORMIN (GLUCOPHAGE) the morning of surgery.  Stop taking Semaglutide 7 days prior to surgery. Last dose on or before 1/23.       HOW TO MANAGE YOUR DIABETES BEFORE AND AFTER SURGERY  Why is it important to control my blood sugar before and after surgery? Improving blood sugar levels before and after surgery helps healing and can limit problems. A way of improving blood sugar control is eating a healthy diet by:  Eating less sugar and carbohydrates  Increasing activity/exercise  Talking with your doctor about reaching your blood sugar goals High blood sugars (greater than 180 mg/dL) can raise your risk of infections and slow your recovery, so you will need to focus on controlling your diabetes during the weeks before surgery. Make sure that the doctor who takes care of your diabetes knows about your planned surgery including the date and location.  How do I manage my blood sugar before surgery? Check your blood sugar at least 4 times a day, starting 2 days before surgery, to make sure that the level is not too high or low.  Check your blood sugar the morning of your surgery when you wake up and  every 2 hours until you get to the Short Stay unit.  If your blood sugar is less than 70 mg/dL, you will need to treat for low blood sugar: Do not take insulin. Treat a low blood sugar (less than 70 mg/dL) with  cup of clear juice (cranberry or apple), 4 glucose tablets, OR glucose gel. Recheck blood sugar in 15 minutes after treatment (to make sure it is greater than 70 mg/dL). If your blood sugar  is not greater than 70 mg/dL on recheck, call [phone] for further instructions. Report your blood sugar to the short stay nurse when you get to Short Stay.  If you are admitted to the hospital after surgery: Your blood sugar will be checked by the staff and you will probably be given insulin after surgery (instead of oral diabetes medicines) to make sure you have good blood sugar levels. The goal for blood sugar control after surgery is 80-180 mg/dL.                     Do NOT Smoke (Tobacco/Vaping) for 24 hours prior to your procedure.  If you use a CPAP at night, you may bring your mask/headgear for your overnight stay.   You will be asked to remove any contacts, glasses, piercing's, hearing aid's, dentures/partials prior to surgery. Please bring cases for these items if needed.    Patients discharged the day of surgery will not be allowed to drive home, and someone needs to stay with them for 24 hours.  SURGICAL WAITING ROOM VISITATION Patients may have no more than 2 support people in the waiting area - these visitors may rotate.   Pre-op nurse will coordinate an appropriate time for 1 ADULT support person, who may not rotate, to accompany patient in pre-op.  Children under the age of 35 must have an adult with them who is not the patient and must remain in the main waiting area with an adult.  If the patient needs to stay at the hospital during part of their recovery, the visitor guidelines for inpatient rooms apply.  Please refer to the [facility] website for the visitor guidelines for any additional information.   If you received a COVID test during your pre-op visit  it is requested that you wear a mask when out in public, stay away from anyone that may not be feeling well and notify your surgeon if you develop symptoms. If you have been in contact with anyone that has tested positive in the last 10 days please notify you surgeon.      Pre-operative 5 CHG Bathing  Instructions   You can play a key role in reducing the risk of infection after surgery. Your skin needs to be as free of germs as possible. You can reduce the number of germs on your skin by washing with CHG (chlorhexidine gluconate) soap before surgery. CHG is an antiseptic soap that kills germs and continues to kill germs even after washing.   DO NOT use if you have an allergy to chlorhexidine/CHG or antibacterial soaps. If your skin becomes reddened or irritated, stop using the CHG and notify one of our RNs at [phone].   Please shower with the CHG soap starting 4 days before surgery using the following schedule:     Please keep in mind the following:  DO NOT shave, including legs and underarms, starting the day of your first shower.   You may shave your face at any point before/day of surgery.  Place clean sheets on your bed the day you start using CHG soap. Use a clean washcloth (not used since being washed) for each shower. DO NOT sleep with pets once you start using the CHG.   CHG Shower Instructions:  Wash your face and private area with normal soap. If you choose to wash your hair, wash first with your normal shampoo.  After you use shampoo/soap, rinse your hair and body thoroughly to remove shampoo/soap residue.  Turn the water OFF and apply about 3 tablespoons (45 ml) of CHG soap to a CLEAN washcloth.  Apply CHG soap ONLY FROM YOUR NECK DOWN TO YOUR TOES (washing for 3-5 minutes)  DO NOT use CHG soap on face, private areas, open wounds, or sores.  Pay special attention to the area where your surgery is being performed.  If you are having back surgery, having someone wash your back for you may be helpful. Wait 2 minutes after CHG soap is applied, then you may rinse off the CHG soap.  Pat dry with a clean towel  Put on clean clothes/pajamas   If you choose to wear lotion, please use ONLY the CHG-compatible lotions that are listed below.  Additional instructions for the day  of surgery: DO NOT APPLY any lotions, deodorants, cologne, or perfumes.   Do not bring valuables to the hospital. [facility] is not responsible for any belongings/valuables. Do not wear nail polish, gel polish, artificial nails, or any other type of covering on natural nails (fingers and toes) Do not wear jewelry or makeup Put on clean/comfortable clothes.  Please brush your teeth.  Ask your nurse before applying any prescription medications to the skin.     CHG Compatible Lotions   Aveeno Moisturizing lotion  Cetaphil Moisturizing Cream  Cetaphil Moisturizing Lotion  Clairol Herbal Essence Moisturizing Lotion, Dry Skin  Clairol Herbal Essence Moisturizing Lotion, Extra Dry Skin  Clairol Herbal Essence Moisturizing Lotion, Normal Skin  Curel Age Defying Therapeutic Moisturizing Lotion with Alpha Hydroxy  Curel Extreme Care Body Lotion  Curel Soothing Hands Moisturizing Hand Lotion  Curel Therapeutic Moisturizing Cream, Fragrance-Free  Curel Therapeutic Moisturizing Lotion, Fragrance-Free  Curel Therapeutic Moisturizing Lotion, Original Formula  Eucerin Daily Replenishing Lotion  Eucerin Dry Skin Therapy Plus Alpha Hydroxy Crme  Eucerin Dry Skin Therapy Plus Alpha Hydroxy Lotion  Eucerin Original Crme  Eucerin Original Lotion  Eucerin Plus Crme Eucerin Plus Lotion  Eucerin TriLipid Replenishing Lotion  Keri Anti-Bacterial Hand Lotion  Keri Deep Conditioning Original Lotion Dry Skin Formula Softly Scented  Keri Deep Conditioning Original Lotion, Fragrance Free Sensitive Skin Formula  Keri Lotion Fast Absorbing Fragrance Free Sensitive Skin Formula  Keri Lotion Fast Absorbing Softly Scented Dry Skin Formula  Keri Original Lotion  Keri Skin Renewal Lotion Keri Silky Smooth Lotion  Keri Silky Smooth Sensitive Skin Lotion  Nivea Body Creamy Conditioning Oil  Nivea Body Extra Enriched Lotion  Nivea Body Original Lotion  Nivea Body Sheer Moisturizing Lotion Nivea Crme  Nivea  Skin Firming Lotion  NutraDerm 30 Skin Lotion  NutraDerm Skin Lotion  NutraDerm Therapeutic Skin Cream  NutraDerm Therapeutic Skin Lotion  ProShield Protective Hand Cream  Provon moisturizing lotion  Please read over the following fact sheets that you were given.

## 2023-06-18 NOTE — Telephone Encounter (Signed)
Pt called stating she is having back surgery 06/22/23 & wanted to ask Dr. Denyse Amass if it was okay for her to have a knee injection before her surgery.

## 2023-06-19 ENCOUNTER — Encounter (HOSPITAL_COMMUNITY): Payer: Self-pay

## 2023-06-19 ENCOUNTER — Encounter (HOSPITAL_COMMUNITY)
Admission: RE | Admit: 2023-06-19 | Discharge: 2023-06-19 | Disposition: A | Discharge status: Home / Self Care | Payer: Medicare Other | Source: Ambulatory Visit | Attending: Orthopedic Surgery | Admitting: Orthopedic Surgery

## 2023-06-19 ENCOUNTER — Other Ambulatory Visit: Payer: Self-pay

## 2023-06-19 VITALS — BP 144/83 | HR 75 | Temp 98.1°F | Resp 17 | Ht 67.0 in | Wt 255.9 lb

## 2023-06-19 DIAGNOSIS — E0865 Diabetes mellitus due to underlying condition with hyperglycemia: Secondary | ICD-10-CM | POA: Insufficient documentation

## 2023-06-19 DIAGNOSIS — Z01812 Encounter for preprocedural laboratory examination: Secondary | ICD-10-CM | POA: Insufficient documentation

## 2023-06-19 DIAGNOSIS — Z01818 Encounter for other preprocedural examination: Secondary | ICD-10-CM

## 2023-06-19 HISTORY — DX: Prediabetes: R73.03

## 2023-06-19 HISTORY — DX: Pneumonia, unspecified organism: J18.9

## 2023-06-19 LAB — SURGICAL PCR SCREEN
MRSA, PCR: NEGATIVE
Staphylococcus aureus: NEGATIVE

## 2023-06-19 LAB — HEMOGLOBIN A1C
Hgb A1c MFr Bld: 5.6 % (ref 4.8–5.6)
Mean Plasma Glucose: 114.02 mg/dL

## 2023-06-19 LAB — GLUCOSE, CAPILLARY: Glucose-Capillary: 119 mg/dL — ABNORMAL HIGH (ref 70–99)

## 2023-06-19 NOTE — Telephone Encounter (Signed)
That is pretty close in time.  Generally surgeons do not want you exposed to steroids right around the time of surgery.  I recommend waiting a week after your back surgery before doing a knee injection.

## 2023-06-19 NOTE — Progress Notes (Signed)
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  Will bring remote on DOS.   Sleep Study -  Yes  CPAP - uses nightly  Pre-Diabetes  Do not take Metformin on the morning of surgery.  Last dose of Semaglutide was on 06/14/23.  If your blood sugar is less than 70 mg/dL, you will need to treat for low blood sugar: Treat a low blood sugar (less than 70 mg/dL) with  cup of clear juice (cranberry or apple), 4 glucose tablets, OR glucose gel. Recheck blood sugar in 15 minutes after treatment (to make sure it is greater than 70 mg/dL). If your blood sugar is not greater than 70 mg/dL on recheck, call [phone] for further instructions.  Blood Thinner Instructions:  Follow your surgeon's instructions to stop Plavix 5 days prior to surgery.  Last dose on 06/15/23 per patient   Aspirin Instructions: n/a  ERAS - Clear liquids til 0930 DOS. PRE-SURGERY G2- Drink was given at PAT appt with instructions for DOS.  Anesthesia review: Yes  STOP now taking any Aspirin (unless otherwise instructed by your surgeon), Aleve, Naproxen, Ibuprofen, Motrin, Advil, Goody's, BC's, all herbal medications, fish oil, and all vitamins.   Coronavirus Screening Do you have any of the following symptoms:  Cough yes/no: No Fever (>100.34F)  yes/no: No Runny nose yes/no: No Sore throat yes/no: No Difficulty breathing/shortness of breath  yes/no: No  Have you traveled in the last 14 days and where? yes/no: No  Patient verbalized understanding of instructions that were given to them at the PAT appointment. Patient was also instructed that they will need to review over the PAT instructions again at home before surgery.

## 2023-06-19 NOTE — Telephone Encounter (Signed)
Spoke to pt and provided Dr. Zollie Pee advise. Schedule pt for a visit for 2/12. She verbalized understanding

## 2023-06-19 NOTE — Pre-Procedure Instructions (Signed)
Surgical Instructions   Your procedure is scheduled on Friday, January 31st. Report to [facility] Main Entrance "A" at 10:30 A.M., then check in with the Admitting office. Any questions or running late day of surgery: call [phone]  Questions prior to your surgery date: call [phone], Monday-Friday, 8am-4pm. If you experience any cold or flu symptoms such as cough, fever, chills, shortness of breath, etc. between now and your scheduled surgery, please notify us at the above number.     Remember:  Do not eat after midnight the night before your surgery  You may drink clear liquids until 09:30 AM the morning of your surgery.   Clear liquids allowed are: Water, Non-Citrus Juices (without pulp), Carbonated Beverages, Clear Tea (no milk, honey, etc.), Black Coffee Only (NO MILK, CREAM OR POWDERED CREAMER of any kind), and Gatorade.  Patient Instructions  The night before surgery:  No food after midnight. ONLY clear liquids after midnight   The day of surgery (if you have diabetes): Drink ONE (1) 12 oz G2 given to you in your pre admission testing appointment by 09:30 AM the morning of surgery. Drink in one sitting. Do not sip.  This drink was given to you during your hospital  pre-op appointment visit.  Nothing else to drink after completing the  12 oz bottle of G2.         If you have questions, please contact your surgeon's office.     Take these medicines the morning of surgery with A SIP OF WATER  amLODipine (NORVASC)  fluticasone-salmeterol (WIXELA INHUB)  gabapentin (NEURONTIN)  levothyroxine (SYNTHROID)  pantoprazole (PROTONIX)  rosuvastatin (CRESTOR)   May take these medicines IF NEEDED: albuterol (PROVENTIL)  albuterol (VENTOLIN HFA)- bring inhaler with you on day of surgery EPINEPHrine  meclizine (ANTIVERT)  methocarbamol (ROBAXIN)  ondansetron (ZOFRAN)    One week prior to surgery, STOP taking any Aspirin (unless otherwise instructed by your surgeon)  Aleve, Naproxen, Ibuprofen, Motrin, Advil, Goody's, BC's, all herbal medications, fish oil, and non-prescription vitamins. This includes meloxicam (MOBIC).  WHAT DO I DO ABOUT MY DIABETES MEDICATION?   Do not take metFORMIN (GLUCOPHAGE) the morning of surgery.  Stop taking Semaglutide 7 days prior to surgery. Last dose on or before 05/31/23 per patient.       HOW TO MANAGE YOUR DIABETES BEFORE AND AFTER SURGERY  Why is it important to control my blood sugar before and after surgery? Improving blood sugar levels before and after surgery helps healing and can limit problems. A way of improving blood sugar control is eating a healthy diet by:  Eating less sugar and carbohydrates  Increasing activity/exercise  Talking with your doctor about reaching your blood sugar goals High blood sugars (greater than 180 mg/dL) can raise your risk of infections and slow your recovery, so you will need to focus on controlling your diabetes during the weeks before surgery. Make sure that the doctor who takes care of your diabetes knows about your planned surgery including the date and location.  How do I manage my blood sugar before surgery? Check your blood sugar at least 4 times a day, starting 2 days before surgery, to make sure that the level is not too high or low.  Check your blood sugar the morning of your surgery when you wake up and every 2 hours until you get to the Short Stay unit.  If your blood sugar is less than 70 mg/dL, you will need to treat for low blood sugar: Do not  take insulin. Treat a low blood sugar (less than 70 mg/dL) with  cup of clear juice (cranberry or apple), 4 glucose tablets, OR glucose gel. Recheck blood sugar in 15 minutes after treatment (to make sure it is greater than 70 mg/dL). If your blood sugar is not greater than 70 mg/dL on recheck, call [phone] for further instructions. Report your blood sugar to the short stay nurse when you get to Short Stay.  If you  are admitted to the hospital after surgery: Your blood sugar will be checked by the staff and you will probably be given insulin after surgery (instead of oral diabetes medicines) to make sure you have good blood sugar levels. The goal for blood sugar control after surgery is 80-180 mg/dL.           If you use a CPAP at night, you may bring your mask/headgear for your overnight stay.   You will be asked to remove any contacts, glasses, piercing's, hearing aid's, dentures/partials prior to surgery. Please bring cases for these items if needed.     SURGICAL WAITING ROOM VISITATION Patients may have no more than 2 support people in the waiting area - these visitors may rotate.   Pre-op nurse will coordinate an appropriate time for 1 ADULT support person, who may not rotate, to accompany patient in pre-op.  Children under the age of 53 must have an adult with them who is not the patient and must remain in the main waiting area with an adult.  If the patient needs to stay at the hospital during part of their recovery, the visitor guidelines for inpatient rooms apply.  Please refer to the [facility] website for the visitor guidelines for any additional information.   If you received a COVID test during your pre-op visit  it is requested that you wear a mask when out in public, stay away from anyone that may not be feeling well and notify your surgeon if you develop symptoms. If you have been in contact with anyone that has tested positive in the last 10 days please notify you surgeon.      Pre-operative 5 CHG Bathing Instructions   You can play a key role in reducing the risk of infection after surgery. Your skin needs to be as free of germs as possible. You can reduce the number of germs on your skin by washing with CHG (chlorhexidine gluconate) soap before surgery. CHG is an antiseptic soap that kills germs and continues to kill germs even after washing.   DO NOT use if you have an allergy  to chlorhexidine/CHG or antibacterial soaps. If your skin becomes reddened or irritated, stop using the CHG and notify one of our RNs at [phone].   Please shower with the CHG soap starting 4 days before surgery using the following schedule:     Please keep in mind the following:  DO NOT shave, including legs and underarms, starting the day of your first shower.   You may shave your face at any point before/day of surgery.  Place clean sheets on your bed the day you start using CHG soap. Use a clean washcloth (not used since being washed) for each shower. DO NOT sleep with pets once you start using the CHG.   CHG Shower Instructions:  Wash your face and private area with normal soap. If you choose to wash your hair, wash first with your normal shampoo.  After you use shampoo/soap, rinse your hair and body thoroughly to  remove shampoo/soap residue.  Turn the water OFF and apply about 3 tablespoons (45 ml) of CHG soap to a CLEAN washcloth.  Apply CHG soap ONLY FROM YOUR NECK DOWN TO YOUR TOES (washing for 3-5 minutes)  DO NOT use CHG soap on face, private areas, open wounds, or sores.  Pay special attention to the area where your surgery is being performed.  If you are having back surgery, having someone wash your back for you may be helpful. Wait 2 minutes after CHG soap is applied, then you may rinse off the CHG soap.  Pat dry with a clean towel  Put on clean clothes/pajamas   If you choose to wear lotion, please use ONLY the CHG-compatible lotions that are listed below.  Additional instructions for the day of surgery: DO NOT APPLY any lotions, deodorants, cologne, or perfumes.   Do not bring valuables to the hospital. [facility] is not responsible for any belongings/valuables. Do not wear nail polish, gel polish, artificial nails, or any other type of covering on natural nails (fingers and toes) Do not wear jewelry or makeup Put on clean/comfortable clothes.  Please brush your  teeth.  Ask your nurse before applying any prescription medications to the skin.     CHG Compatible Lotions   Aveeno Moisturizing lotion  Cetaphil Moisturizing Cream  Cetaphil Moisturizing Lotion  Clairol Herbal Essence Moisturizing Lotion, Dry Skin  Clairol Herbal Essence Moisturizing Lotion, Extra Dry Skin  Clairol Herbal Essence Moisturizing Lotion, Normal Skin  Curel Age Defying Therapeutic Moisturizing Lotion with Alpha Hydroxy  Curel Extreme Care Body Lotion  Curel Soothing Hands Moisturizing Hand Lotion  Curel Therapeutic Moisturizing Cream, Fragrance-Free  Curel Therapeutic Moisturizing Lotion, Fragrance-Free  Curel Therapeutic Moisturizing Lotion, Original Formula  Eucerin Daily Replenishing Lotion  Eucerin Dry Skin Therapy Plus Alpha Hydroxy Crme  Eucerin Dry Skin Therapy Plus Alpha Hydroxy Lotion  Eucerin Original Crme  Eucerin Original Lotion  Eucerin Plus Crme Eucerin Plus Lotion  Eucerin TriLipid Replenishing Lotion  Keri Anti-Bacterial Hand Lotion  Keri Deep Conditioning Original Lotion Dry Skin Formula Softly Scented  Keri Deep Conditioning Original Lotion, Fragrance Free Sensitive Skin Formula  Keri Lotion Fast Absorbing Fragrance Free Sensitive Skin Formula  Keri Lotion Fast Absorbing Softly Scented Dry Skin Formula  Keri Original Lotion  Keri Skin Renewal Lotion Keri Silky Smooth Lotion  Keri Silky Smooth Sensitive Skin Lotion  Nivea Body Creamy Conditioning Oil  Nivea Body Extra Enriched Lotion  Nivea Body Original Lotion  Nivea Body Sheer Moisturizing Lotion Nivea Crme  Nivea Skin Firming Lotion  NutraDerm 30 Skin Lotion  NutraDerm Skin Lotion  NutraDerm Therapeutic Skin Cream  NutraDerm Therapeutic Skin Lotion  ProShield Protective Hand Cream  Provon moisturizing lotion  Please read over the following fact sheets that you were given.

## 2023-06-20 ENCOUNTER — Telehealth: Payer: Self-pay

## 2023-06-20 MED ORDER — LEVOTHYROXINE SODIUM 150 MCG PO TABS: 150.0000 ug | ORAL_TABLET | Freq: Every day | ORAL | 3 refills | Status: DC

## 2023-06-20 NOTE — Telephone Encounter (Signed)
Requested Prescriptions   Signed Prescriptions Disp Refills   levothyroxine (SYNTHROID) 150 MCG tablet 90 tablet 3    Sig: Take 1 tablet (150 mcg total) by mouth daily.    Authorizing Provider: Carlus Pavlov    Ordering User: Pollie Meyer

## 2023-06-21 ENCOUNTER — Ambulatory Visit: Payer: Medicare Other | Admitting: Bariatrics

## 2023-06-21 ENCOUNTER — Encounter: Payer: Self-pay | Admitting: Bariatrics

## 2023-06-21 VITALS — BP 130/80 | HR 70 | Temp 97.7°F | Ht 66.0 in | Wt 253.0 lb

## 2023-06-21 DIAGNOSIS — R7303 Prediabetes: Secondary | ICD-10-CM | POA: Diagnosis not present

## 2023-06-21 DIAGNOSIS — I10 Essential (primary) hypertension: Secondary | ICD-10-CM

## 2023-06-21 DIAGNOSIS — Z6841 Body Mass Index (BMI) 40.0 and over, adult: Secondary | ICD-10-CM | POA: Diagnosis not present

## 2023-06-21 DIAGNOSIS — E66813 Obesity, class 3: Secondary | ICD-10-CM

## 2023-06-21 MED ORDER — METFORMIN HCL 500 MG PO TABS: 500.0000 mg | ORAL_TABLET | Freq: Two times a day (BID) | ORAL | 0 refills | Status: DC

## 2023-06-21 NOTE — H&P (Addendum)
Orthopedic Spine Surgery H&P Note  Assessment: Patient is a 68 y.o. female with lumbar radiculopathy from a lumbar facet cyst at L4/5   Plan: -Out of bed as tolerated, activity as tolerated, no brace -Covered the risks of surgery one more time with the patient and patient elected to proceed with planned surgery -Written consent verified -Hold anticoagulation in anticipation of surgery -Ancef and TXA on all to OR -NPO for procedure -Site marked -To OR when ready  The risks of surgery that were covered with the patient today included but were not limited to: iatrogenic instability, facet cyst recurrence, dural tear, nerve root injury, paralysis, persistent pain, infection, bleeding, heart attack, death, fracture, dvt/pe, and need for additional procedures   ___________________________________________________________________________  Chief Complaint: low back pain that radiates into bilateral lower extremity  History: Patient is 68 y.o. female who has been previously seen in the office for low back pain that radiates into her bilateral buttock and posterior thighs. Work up was consistent with radiculopathy. These symptoms failed to improve with conservative treatment so operative management was discussed at the last office visit. The patient presents today with no changes in her symptoms since the last office visit. See previous office note for further details.    Review of systems: General: denies fevers and chills, myalgias Neurologic: denies recent changes in vision, slurred speech Abdomen: denies nausea, vomiting, hematemesis Respiratory: denies cough, shortness of breath  Past medical history: HLD CAD HTN DM (last A1c was 5.6 on 06/19/2023) GERD Fibromyalgia Migraines Depression/Anxiety Chronic pain COPD OSA Basal cell carcinoma s/p excision Breast cancer in remission CHF Glaucoma   Allergies: iodine, gadolinium, latex, codeine, pentazocine, aspirin, erythromycin,  symbicort, compazine, sulfa drugs   Past surgical history:  Bladder stimulator placement Hysterectomy Basal cell carcinoma excision Achilles tendon repair Breat lumpectomy Cholecystectomy Knee arthroscopy Thyroidectomy Great toe fracture ORIF Tonsillectomy Tubal ligation   Social history: Denies use of nicotine product (smoking, vaping, patches, smokeless) Alcohol use: denies Denies recreational drug use  Family history: -reviewed and not pertinent to lumbar facet cyst   Physical Exam:  General: no acute distress, appears stated age Neurologic: alert, answering questions appropriately, following commands Cardiovascular: regular rate, no cyanosis Respiratory: unlabored breathing on room air, symmetric chest rise Psychiatric: appropriate affect, normal cadence to speech   MSK (spine):  -Strength exam      Left  Right  EHL    5/5  5/5 TA    5/5  5/5 GSC    5/5  5/5 Knee extension  5/5  5/5 Knee flexion   5/5  5/5 Hip flexion   5/5  5/5  -Sensory exam    Sensation intact to light touch in L2-S1 nerve distributions of bilateral lower extremities   Patient name: Michelle Kennedy Patient MRN: [id number] Date: 06/21/2022

## 2023-06-21 NOTE — Progress Notes (Signed)
WEIGHT SUMMARY AND BIOMETRICS  Weight Lost Since Last Visit: 0  Weight Gained Since Last Visit: 5lb   Vitals Temp: 97.7 F (36.5 C) BP: 130/80 Pulse Rate: 70 SpO2: 98 %   Anthropometric Measurements Height: 5\' 6"  (1.676 m) Weight: 253 lb (114.8 kg) BMI (Calculated): 40.85 Weight at Last Visit: 248lb Weight Lost Since Last Visit: 0 Weight Gained Since Last Visit: 5lb Starting Weight: 275lb Total Weight Loss (lbs): 22 lb (9.979 kg)   Body Composition  Body Fat %: 50.7 % Fat Mass (lbs): 128.6 lbs Muscle Mass (lbs): 118.6 lbs Total Body Water (lbs): 91.8 lbs Visceral Fat Rating : 17   Other Clinical Data Fasting: no Labs: no Today's Visit #: 15 Starting Date: 07/28/21    OBESITY Kharis is here to discuss her progress with her obesity treatment plan along with follow-up of her obesity related diagnoses.    Nutrition Plan: keeping a food journal with goal of 1600 calories and 90 grams of protein daily - 60% adherence.  Current exercise: none  Interim History:  She is up 5 lbs since her last visit. She states that her insurance will no longer pay for her Ozempic.  Eating all of the food on the plan., Protein intake is as prescribed, Is not skipping meals, Water intake is adequate., and Denies polyphagia   Pharmacotherapy: Cynara has been on Ozempic 2 mg SQ weekly.  Her insurance will no longer pay for Ozempic because she is prediabetic.  She was moving toward diabetes in 2022 but started on metformin at that time and then was put on Ozempic.  It is hard to determine if she is a diabetic at this time..  I went back and reviewed her chart and the highest A1c that I saw was 6.2. Hunger is moderately controlled.  Cravings are poorly controlled.  Assessment/Plan:   Prediabetes Last A1c was 5.6  Medication(s): She had been on Ozempic but stopped due to  lack of insurance coverage.  She has also been on metformin but at a low dose. Metformin 500 mg once daily breakfast Lab Results  Component Value Date   HGBA1C 5.6 06/19/2023   HGBA1C 5.9 02/21/2023   HGBA1C 5.7 (A) 10/03/2022   HGBA1C 5.7 (H) 07/06/2022   HGBA1C 5.7 (A) 02/02/2022   Lab Results  Component Value Date   INSULIN 18.0 07/06/2022   INSULIN 25.6 (H) 07/28/2021   INSULIN 21.8 09/20/2016    Plan: Will minimize all refined carbohydrates both sweets and starches.  Will begin a low-carb diet.  Will work on the plan and exercise.  Consider both aerobic and resistance training.  Will keep protein, water, and fiber intake high.  Aim for 7 to 9 hours of sleep nightly.  Will continue medications.  Continue and increase dose Metformin 500 mg twice daily with meals   Hypertension Hypertension stable.  Medication(s): Lisinopril 20  mg, Amlodipine 5 mg.   BP Readings from Last 3 Encounters:  06/21/23 130/80  06/19/23 (!) 144/83  06/05/23 (!) 144/73   Lab Results  Component Value Date   CREATININE 0.88 06/05/2023   CREATININE 0.76 02/21/2023   CREATININE 0.95 07/06/2022   Lab Results  Component Value Date   GFR 67.27 06/05/2023   GFR 80.37 02/21/2023   GFR 78.91 04/27/2021    Plan: Continue all antihypertensives at current dosages. No added salt. Will keep sodium content to 1,500 mg or less per day.      Morbid Obesity: Current BMI BMI (Calculated): 40.85   Pharmacotherapy Plan Continue and increase dose  Metformin 500 mg twice daily with meals  Canisha is currently in the action stage of change. As such, her goal is to continue with weight loss efforts.  She has agreed to low carbohydrate plan.  We discussed the low-carb plan in detail and also suggested that she moved toward the new Renaldo Fiddler for you plan in the future by Dr. Danny Lawless.  Will going to get the books and her husband is going to start cook in this manner.  Exercise goals: No exercise has been  prescribed at this time.  She is currently in chronic pain and will have surgery in the near future after that her surgeon can determine how she can rehabilitate and then an exercise regimen will be investigated.  Behavioral modification strategies: increasing lean protein intake, meal planning , decrease liquid calories, increase water intake, better snacking choices, planning for success, increasing lower sugar fruits, avoiding temptations, keep healthy foods in the home, and weigh protein portions.  [place] has agreed to follow-up with our clinic in 4 weeks.      Objective:   VITALS: Per patient if applicable, see vitals. GENERAL: Alert and in no acute distress. CARDIOPULMONARY: No increased WOB. Speaking in clear sentences.  PSYCH: Pleasant and cooperative. Speech normal rate and rhythm. Affect is appropriate. Insight and judgement are appropriate. Attention is focused, linear, and appropriate.  NEURO: Oriented as arrived to appointment on time with no prompting.   Attestation Statements:   This was prepared with the assistance of Engineer, civil (consulting).  Occasional wrong-word or sound-a-like substitutions may have occurred due to the inherent limitations of voice recognition   Corinna Capra, DO

## 2023-06-22 ENCOUNTER — Other Ambulatory Visit: Payer: Self-pay

## 2023-06-22 ENCOUNTER — Ambulatory Visit (HOSPITAL_BASED_OUTPATIENT_CLINIC_OR_DEPARTMENT_OTHER): Payer: Medicare Other | Admitting: Anesthesiology

## 2023-06-22 ENCOUNTER — Ambulatory Visit (HOSPITAL_COMMUNITY)
Admission: RE | Admit: 2023-06-22 | Discharge: 2023-06-23 | Disposition: A | Discharge status: Home / Self Care | Payer: Medicare Other | Attending: Orthopedic Surgery | Admitting: Orthopedic Surgery

## 2023-06-22 ENCOUNTER — Ambulatory Visit (HOSPITAL_COMMUNITY): Payer: Medicare Other

## 2023-06-22 ENCOUNTER — Encounter (HOSPITAL_COMMUNITY): Payer: Self-pay | Admitting: Orthopedic Surgery

## 2023-06-22 ENCOUNTER — Ambulatory Visit (HOSPITAL_COMMUNITY): Payer: Medicare Other | Admitting: Physician Assistant

## 2023-06-22 ENCOUNTER — Encounter (HOSPITAL_COMMUNITY)
Admission: RE | Disposition: A | Discharge status: Home / Self Care | Payer: Self-pay | Source: Home / Self Care | Attending: Orthopedic Surgery

## 2023-06-22 DIAGNOSIS — G4730 Sleep apnea, unspecified: Secondary | ICD-10-CM | POA: Insufficient documentation

## 2023-06-22 DIAGNOSIS — Z6841 Body Mass Index (BMI) 40.0 and over, adult: Secondary | ICD-10-CM | POA: Insufficient documentation

## 2023-06-22 DIAGNOSIS — K219 Gastro-esophageal reflux disease without esophagitis: Secondary | ICD-10-CM | POA: Diagnosis not present

## 2023-06-22 DIAGNOSIS — J449 Chronic obstructive pulmonary disease, unspecified: Secondary | ICD-10-CM | POA: Diagnosis not present

## 2023-06-22 DIAGNOSIS — E66813 Obesity, class 3: Secondary | ICD-10-CM | POA: Diagnosis not present

## 2023-06-22 DIAGNOSIS — M48061 Spinal stenosis, lumbar region without neurogenic claudication: Secondary | ICD-10-CM

## 2023-06-22 DIAGNOSIS — I509 Heart failure, unspecified: Secondary | ICD-10-CM

## 2023-06-22 DIAGNOSIS — M5416 Radiculopathy, lumbar region: Secondary | ICD-10-CM | POA: Diagnosis not present

## 2023-06-22 DIAGNOSIS — Z01818 Encounter for other preprocedural examination: Secondary | ICD-10-CM

## 2023-06-22 DIAGNOSIS — I110 Hypertensive heart disease with heart failure: Secondary | ICD-10-CM | POA: Insufficient documentation

## 2023-06-22 DIAGNOSIS — Z9889 Other specified postprocedural states: Secondary | ICD-10-CM

## 2023-06-22 DIAGNOSIS — M7138 Other bursal cyst, other site: Secondary | ICD-10-CM | POA: Diagnosis present

## 2023-06-22 HISTORY — PX: DECOMPRESSIVE LUMBAR LAMINECTOMY LEVEL 1: SHX5791

## 2023-06-22 LAB — GLUCOSE, CAPILLARY: Glucose-Capillary: 100 mg/dL — ABNORMAL HIGH (ref 70–99)

## 2023-06-22 SURGERY — DECOMPRESSIVE LUMBAR LAMINECTOMY LEVEL 1
Anesthesia: General

## 2023-06-22 MED ORDER — ALBUTEROL SULFATE (2.5 MG/3ML) 0.083% IN NEBU: 2.5000 mg | INHALATION_SOLUTION | Freq: Four times a day (QID) | RESPIRATORY_TRACT | Status: DC | PRN

## 2023-06-22 MED ORDER — DEXAMETHASONE SODIUM PHOSPHATE 10 MG/ML IJ SOLN
8.0000 mg | Freq: Once | INTRAMUSCULAR | Status: AC
  Administered 2023-06-22: 8 mg via INTRAVENOUS

## 2023-06-22 MED ORDER — CLINDAMYCIN PHOSPHATE 900 MG/50ML IV SOLN
INTRAVENOUS | Status: AC
  Filled 2023-06-22: qty 50

## 2023-06-22 MED ORDER — DEXAMETHASONE SODIUM PHOSPHATE 10 MG/ML IJ SOLN: 10.0000 mg | Freq: Once | INTRAMUSCULAR | Status: DC

## 2023-06-22 MED ORDER — BUPIVACAINE-EPINEPHRINE (PF) 0.25% -1:200000 IJ SOLN
INTRAMUSCULAR | Status: AC
  Filled 2023-06-22: qty 30

## 2023-06-22 MED ORDER — LACTATED RINGERS IV SOLN: INTRAVENOUS | Status: DC

## 2023-06-22 MED ORDER — VANCOMYCIN HCL 1000 MG IV SOLR
INTRAVENOUS | Status: DC | PRN
  Administered 2023-06-22: 1000 mg via TOPICAL

## 2023-06-22 MED ORDER — FENTANYL CITRATE (PF) 250 MCG/5ML IJ SOLN
INTRAMUSCULAR | Status: AC
  Filled 2023-06-22: qty 5

## 2023-06-22 MED ORDER — CHLORHEXIDINE GLUCONATE 0.12 % MT SOLN
15.0000 mL | Freq: Once | OROMUCOSAL | Status: AC
Start: 2023-06-22 — End: 2023-06-22

## 2023-06-22 MED ORDER — AMLODIPINE BESYLATE 5 MG PO TABS
5.0000 mg | ORAL_TABLET | Freq: Every day | ORAL | Status: DC
  Administered 2023-06-23: 5 mg via ORAL
  Filled 2023-06-22: qty 1

## 2023-06-22 MED ORDER — HYDROMORPHONE HCL 1 MG/ML IJ SOLN
0.5000 mg | INTRAMUSCULAR | Status: AC | PRN
  Administered 2023-06-22: 0.5 mg via INTRAVENOUS
  Filled 2023-06-22: qty 0.5

## 2023-06-22 MED ORDER — FENTANYL CITRATE (PF) 250 MCG/5ML IJ SOLN
INTRAMUSCULAR | Status: DC | PRN
  Administered 2023-06-22: 100 ug via INTRAVENOUS
  Administered 2023-06-22 (×3): 50 ug via INTRAVENOUS

## 2023-06-22 MED ORDER — ROCURONIUM BROMIDE 10 MG/ML (PF) SYRINGE
PREFILLED_SYRINGE | INTRAVENOUS | Status: DC | PRN
  Administered 2023-06-22: 10 mg via INTRAVENOUS
  Administered 2023-06-22: 70 mg via INTRAVENOUS
  Administered 2023-06-22: 30 mg via INTRAVENOUS

## 2023-06-22 MED ORDER — CHLORHEXIDINE GLUCONATE 0.12 % MT SOLN
OROMUCOSAL | Status: AC
  Administered 2023-06-22: 15 mL via OROMUCOSAL
  Filled 2023-06-22: qty 15

## 2023-06-22 MED ORDER — OXYCODONE HCL 5 MG/5ML PO SOLN: 5.0000 mg | Freq: Once | ORAL | Status: DC | PRN

## 2023-06-22 MED ORDER — PHENYLEPHRINE HCL-NACL 20-0.9 MG/250ML-% IV SOLN
INTRAVENOUS | Status: DC | PRN
  Administered 2023-06-22: 15 ug/min via INTRAVENOUS

## 2023-06-22 MED ORDER — ONDANSETRON HCL 4 MG/2ML IJ SOLN: 4.0000 mg | Freq: Once | INTRAMUSCULAR | Status: DC | PRN

## 2023-06-22 MED ORDER — HYDROMORPHONE HCL 1 MG/ML IJ SOLN
INTRAMUSCULAR | Status: AC
  Administered 2023-06-22: 0.5 mg via INTRAVENOUS
  Filled 2023-06-22: qty 1

## 2023-06-22 MED ORDER — VANCOMYCIN HCL 1000 MG IV SOLR
INTRAVENOUS | Status: AC
  Filled 2023-06-22: qty 20

## 2023-06-22 MED ORDER — OXYCODONE HCL 5 MG PO TABS: 5.0000 mg | ORAL_TABLET | Freq: Once | ORAL | Status: DC | PRN

## 2023-06-22 MED ORDER — SENNA 8.6 MG PO TABS
1.0000 | ORAL_TABLET | Freq: Two times a day (BID) | ORAL | Status: DC
  Administered 2023-06-22 – 2023-06-23 (×2): 8.6 mg via ORAL
  Filled 2023-06-22 (×2): qty 1

## 2023-06-22 MED ORDER — LATANOPROST 0.005 % OP SOLN
1.0000 [drp] | Freq: Every day | OPHTHALMIC | Status: DC
  Administered 2023-06-22: 1 [drp] via OPHTHALMIC
  Filled 2023-06-22: qty 2.5

## 2023-06-22 MED ORDER — ROSUVASTATIN CALCIUM 5 MG PO TABS
5.0000 mg | ORAL_TABLET | Freq: Every day | ORAL | Status: DC
  Administered 2023-06-23: 5 mg via ORAL
  Filled 2023-06-22: qty 1

## 2023-06-22 MED ORDER — 0.9 % SODIUM CHLORIDE (POUR BTL) OPTIME
TOPICAL | Status: DC | PRN
  Administered 2023-06-22: 1000 mL

## 2023-06-22 MED ORDER — ORAL CARE MOUTH RINSE: 15.0000 mL | Freq: Once | OROMUCOSAL | Status: AC

## 2023-06-22 MED ORDER — GABAPENTIN 300 MG PO CAPS
300.0000 mg | ORAL_CAPSULE | Freq: Three times a day (TID) | ORAL | Status: DC
  Administered 2023-06-22 – 2023-06-23 (×2): 300 mg via ORAL
  Filled 2023-06-22 (×2): qty 1

## 2023-06-22 MED ORDER — ONDANSETRON HCL 4 MG/2ML IJ SOLN
4.0000 mg | Freq: Four times a day (QID) | INTRAMUSCULAR | Status: DC | PRN
Start: 2023-06-22 — End: 2023-06-23
  Administered 2023-06-22: 4 mg via INTRAVENOUS
  Filled 2023-06-22: qty 2

## 2023-06-22 MED ORDER — TERBINAFINE HCL 250 MG PO TABS
250.0000 mg | ORAL_TABLET | Freq: Every day | ORAL | Status: DC
  Administered 2023-06-22 – 2023-06-23 (×2): 250 mg via ORAL
  Filled 2023-06-22 (×2): qty 1

## 2023-06-22 MED ORDER — TRANEXAMIC ACID-NACL 1000-0.7 MG/100ML-% IV SOLN
INTRAVENOUS | Status: AC
  Filled 2023-06-22: qty 100

## 2023-06-22 MED ORDER — SUGAMMADEX SODIUM 200 MG/2ML IV SOLN
INTRAVENOUS | Status: DC | PRN
  Administered 2023-06-22: 200 mg via INTRAVENOUS

## 2023-06-22 MED ORDER — TRANEXAMIC ACID-NACL 1000-0.7 MG/100ML-% IV SOLN
1000.0000 mg | Freq: Once | INTRAVENOUS | Status: AC
  Administered 2023-06-22: 1000 mg via INTRAVENOUS
  Filled 2023-06-22: qty 100

## 2023-06-22 MED ORDER — EPHEDRINE SULFATE-NACL 50-0.9 MG/10ML-% IV SOSY
PREFILLED_SYRINGE | INTRAVENOUS | Status: DC | PRN
  Administered 2023-06-22 (×5): 5 mg via INTRAVENOUS

## 2023-06-22 MED ORDER — HYDROMORPHONE HCL 1 MG/ML IJ SOLN
0.2500 mg | INTRAMUSCULAR | Status: DC | PRN
  Administered 2023-06-22 (×2): 0.5 mg via INTRAVENOUS

## 2023-06-22 MED ORDER — MONTELUKAST SODIUM 10 MG PO TABS
10.0000 mg | ORAL_TABLET | Freq: Every day | ORAL | Status: DC
  Administered 2023-06-22: 10 mg via ORAL
  Filled 2023-06-22: qty 1

## 2023-06-22 MED ORDER — PROPOFOL 10 MG/ML IV BOLUS
INTRAVENOUS | Status: DC | PRN
  Administered 2023-06-22: 110 mg via INTRAVENOUS

## 2023-06-22 MED ORDER — ONDANSETRON HCL 4 MG/2ML IJ SOLN
INTRAMUSCULAR | Status: AC
  Filled 2023-06-22: qty 2

## 2023-06-22 MED ORDER — LISINOPRIL 20 MG PO TABS: 40.0000 mg | ORAL_TABLET | Freq: Every day | ORAL | Status: DC

## 2023-06-22 MED ORDER — PANTOPRAZOLE SODIUM 40 MG PO TBEC
40.0000 mg | DELAYED_RELEASE_TABLET | Freq: Every day | ORAL | Status: DC
  Administered 2023-06-22 – 2023-06-23 (×2): 40 mg via ORAL
  Filled 2023-06-22 (×2): qty 1

## 2023-06-22 MED ORDER — ACETAMINOPHEN 500 MG PO TABS
1000.0000 mg | ORAL_TABLET | Freq: Three times a day (TID) | ORAL | Status: DC
  Administered 2023-06-22 – 2023-06-23 (×2): 1000 mg via ORAL
  Filled 2023-06-22 (×2): qty 2

## 2023-06-22 MED ORDER — ROCURONIUM BROMIDE 10 MG/ML (PF) SYRINGE
PREFILLED_SYRINGE | INTRAVENOUS | Status: AC
  Filled 2023-06-22: qty 10

## 2023-06-22 MED ORDER — LIDOCAINE 2% (20 MG/ML) 5 ML SYRINGE
INTRAMUSCULAR | Status: AC
  Filled 2023-06-22: qty 5

## 2023-06-22 MED ORDER — DIPHENHYDRAMINE HCL 50 MG/ML IJ SOLN
INTRAMUSCULAR | Status: DC | PRN
  Administered 2023-06-22: 12.5 mg via INTRAVENOUS

## 2023-06-22 MED ORDER — ADULT MULTIVITAMIN W/MINERALS CH
1.0000 | ORAL_TABLET | Freq: Every day | ORAL | Status: DC
  Administered 2023-06-22 – 2023-06-23 (×2): 1 via ORAL
  Filled 2023-06-22 (×2): qty 1

## 2023-06-22 MED ORDER — ONDANSETRON HCL 4 MG PO TABS
4.0000 mg | ORAL_TABLET | Freq: Four times a day (QID) | ORAL | Status: DC | PRN
  Administered 2023-06-23: 4 mg via ORAL

## 2023-06-22 MED ORDER — CLINDAMYCIN PHOSPHATE 900 MG/50ML IV SOLN
900.0000 mg | INTRAVENOUS | Status: AC
Start: 2023-06-22 — End: 2023-06-22
  Administered 2023-06-22: 900 mg via INTRAVENOUS

## 2023-06-22 MED ORDER — METHOCARBAMOL 750 MG PO TABS
750.0000 mg | ORAL_TABLET | Freq: Four times a day (QID) | ORAL | Status: DC
  Administered 2023-06-22 – 2023-06-23 (×3): 750 mg via ORAL
  Filled 2023-06-22 (×3): qty 1

## 2023-06-22 MED ORDER — ONDANSETRON HCL 4 MG/2ML IJ SOLN
INTRAMUSCULAR | Status: DC | PRN
  Administered 2023-06-22: 4 mg via INTRAVENOUS

## 2023-06-22 MED ORDER — METFORMIN HCL 500 MG PO TABS
500.0000 mg | ORAL_TABLET | Freq: Two times a day (BID) | ORAL | Status: DC
  Administered 2023-06-23: 500 mg via ORAL
  Filled 2023-06-22: qty 1

## 2023-06-22 MED ORDER — EPINEPHRINE 0.3 MG/0.3ML IJ SOAJ
0.3000 mg | INTRAMUSCULAR | Status: DC | PRN
Start: 2023-06-22 — End: 2023-06-23

## 2023-06-22 MED ORDER — LEVOTHYROXINE SODIUM 75 MCG PO TABS
150.0000 ug | ORAL_TABLET | Freq: Every day | ORAL | Status: DC
Start: 2023-06-23 — End: 2023-06-23
  Administered 2023-06-23: 150 ug via ORAL
  Filled 2023-06-22: qty 2

## 2023-06-22 MED ORDER — DIPHENHYDRAMINE HCL 50 MG/ML IJ SOLN
INTRAMUSCULAR | Status: AC
  Filled 2023-06-22: qty 1

## 2023-06-22 MED ORDER — DEXAMETHASONE SODIUM PHOSPHATE 10 MG/ML IJ SOLN
INTRAMUSCULAR | Status: AC
  Filled 2023-06-22: qty 1

## 2023-06-22 MED ORDER — TRANEXAMIC ACID-NACL 1000-0.7 MG/100ML-% IV SOLN
1000.0000 mg | INTRAVENOUS | Status: AC
  Administered 2023-06-22: 1000 mg via INTRAVENOUS

## 2023-06-22 MED ORDER — ALBUMIN HUMAN 5 % IV SOLN: INTRAVENOUS | Status: DC | PRN

## 2023-06-22 MED ORDER — MIDAZOLAM HCL 2 MG/2ML IJ SOLN
INTRAMUSCULAR | Status: AC
  Filled 2023-06-22: qty 2

## 2023-06-22 MED ORDER — OXYCODONE HCL 5 MG PO TABS
5.0000 mg | ORAL_TABLET | ORAL | Status: DC | PRN
  Administered 2023-06-23 (×2): 10 mg via ORAL
  Filled 2023-06-22 (×2): qty 2

## 2023-06-22 MED ORDER — POLYETHYLENE GLYCOL 3350 17 G PO PACK
17.0000 g | PACK | Freq: Every day | ORAL | Status: DC
  Administered 2023-06-22 – 2023-06-23 (×2): 17 g via ORAL
  Filled 2023-06-22 (×2): qty 1

## 2023-06-22 MED ORDER — EPHEDRINE 5 MG/ML INJ
INTRAVENOUS | Status: AC
  Filled 2023-06-22: qty 5

## 2023-06-22 MED ORDER — CLINDAMYCIN PHOSPHATE 900 MG/50ML IV SOLN
900.0000 mg | Freq: Three times a day (TID) | INTRAVENOUS | Status: AC
  Administered 2023-06-22 – 2023-06-23 (×2): 900 mg via INTRAVENOUS
  Filled 2023-06-22 (×2): qty 50

## 2023-06-22 MED ORDER — MECLIZINE HCL 25 MG PO TABS: 25.0000 mg | ORAL_TABLET | Freq: Three times a day (TID) | ORAL | Status: DC | PRN

## 2023-06-22 MED ORDER — MIDAZOLAM HCL 2 MG/2ML IJ SOLN
INTRAMUSCULAR | Status: DC | PRN
  Administered 2023-06-22: 2 mg via INTRAVENOUS

## 2023-06-22 MED ORDER — AMISULPRIDE (ANTIEMETIC) 5 MG/2ML IV SOLN: 10.0000 mg | Freq: Once | INTRAVENOUS | Status: DC | PRN

## 2023-06-22 SURGICAL SUPPLY — 41 items
BENZOIN TINCTURE AMPULE (MISCELLANEOUS) ×1 IMPLANT
BENZOIN TINCTURE PRP APPL 2/3 (GAUZE/BANDAGES/DRESSINGS) ×1 IMPLANT
BUR MATCHSTICK NEURO 3.0 LAGG (BURR) ×1 IMPLANT
CANISTER SUCT 3000ML PPV (MISCELLANEOUS) ×1 IMPLANT
CHLORAPREP W/TINT 26 (MISCELLANEOUS) IMPLANT
COVER MAYO STAND STRL (DRAPES) ×3 IMPLANT
COVER SURGICAL LIGHT HANDLE (MISCELLANEOUS) ×1 IMPLANT
DERMABOND ADVANCED .7 DNX12 (GAUZE/BANDAGES/DRESSINGS) IMPLANT
DRAIN HEMOVAC 7FR (DRAIN) IMPLANT
DRAPE C-ARM 42X72 X-RAY (DRAPES) ×1 IMPLANT
DRAPE UTILITY XL STRL (DRAPES) ×2 IMPLANT
DRESSING MEPILEX FLEX 4X4 (GAUZE/BANDAGES/DRESSINGS) ×1 IMPLANT
DRSG MEPILEX FLEX 4X4 (GAUZE/BANDAGES/DRESSINGS)
DRSG MEPILEX POST OP 4X8 (GAUZE/BANDAGES/DRESSINGS) ×1 IMPLANT
DRSG TEGADERM 4X4.75 (GAUZE/BANDAGES/DRESSINGS) ×3 IMPLANT
DURAPREP 26ML APPLICATOR (WOUND CARE) ×1 IMPLANT
ELECT PENCIL ROCKER SW 15FT (MISCELLANEOUS) ×1 IMPLANT
ELECT REM PT RETURN 9FT ADLT (ELECTROSURGICAL) ×1
ELECTRODE REM PT RTRN 9FT ADLT (ELECTROSURGICAL) ×1 IMPLANT
GAUZE SPONGE 4X4 12PLY STRL (GAUZE/BANDAGES/DRESSINGS) ×1 IMPLANT
GLOVE BIO SURGEON STRL SZ7.5 (GLOVE) ×1 IMPLANT
GLOVE INDICATOR 7.5 STRL GRN (GLOVE) ×1 IMPLANT
GOWN STRL SURGICAL XL XLNG (GOWN DISPOSABLE) ×1 IMPLANT
KIT BASIN OR (CUSTOM PROCEDURE TRAY) ×1 IMPLANT
KIT POSITION SURG JACKSON T1 (MISCELLANEOUS) ×1 IMPLANT
KIT TURNOVER KIT B (KITS) ×1 IMPLANT
NS IRRIG 1000ML POUR BTL (IV SOLUTION) ×1 IMPLANT
PACK LAMINECTOMY ORTHO (CUSTOM PROCEDURE TRAY) ×1 IMPLANT
PATTIES SURGICAL .5 X.5 (GAUZE/BANDAGES/DRESSINGS) IMPLANT
SPONGE SURGIFOAM ABS GEL 100 (HEMOSTASIS) ×1 IMPLANT
SUCTION TUBE FRAZIER 10FR DISP (SUCTIONS) ×1 IMPLANT
SUT BONE WAX W31G (SUTURE) ×1 IMPLANT
SUT MNCRL AB 3-0 PS2 27 (SUTURE) ×1 IMPLANT
SUT MNCRL+ AB 3-0 CT1 36 (SUTURE) ×1 IMPLANT
SUT VIC AB 0 CT1 18XCR BRD8 (SUTURE) ×1 IMPLANT
SUT VIC AB 2-0 CT1 18 (SUTURE) ×1 IMPLANT
SUT VIC AB 2-0 CT2 18 VCP726D (SUTURE) ×1 IMPLANT
TOWEL GREEN STERILE (TOWEL DISPOSABLE) ×1 IMPLANT
TOWEL GREEN STERILE FF (TOWEL DISPOSABLE) ×1 IMPLANT
TUBING FEATHERFLOW (TUBING) ×1 IMPLANT
WATER STERILE IRR 1000ML POUR (IV SOLUTION) ×1 IMPLANT

## 2023-06-22 NOTE — Anesthesia Preprocedure Evaluation (Signed)
Anesthesia Evaluation  Patient identified by MRN, date of birth, ID band Patient awake    Reviewed: Allergy & Precautions, NPO status , Patient's Chart, lab work & pertinent test results  History of Anesthesia Complications (+) Family history of anesthesia reaction  Airway Mallampati: III  TM Distance: >3 FB Neck ROM: Full  Mouth opening: Limited Mouth Opening  Dental  (+) Dental Advisory Given, Caps   Pulmonary asthma , sleep apnea , COPD   Pulmonary exam normal breath sounds clear to auscultation       Cardiovascular hypertension, Pt. on medications +CHF  Normal cardiovascular exam Rhythm:Regular Rate:Normal     Neuro/Psych  Headaches  Anxiety Depression     Neuromuscular disease    GI/Hepatic Neg liver ROS,GERD  Medicated,,  Endo/Other  Hypothyroidism  Class 3 obesity  Renal/GU negative Renal ROS     Musculoskeletal  (+) Arthritis ,  Fibromyalgia -  Abdominal  (+) + obese  Peds  Hematology  (+) Blood dyscrasia, anemia   Anesthesia Other Findings   Reproductive/Obstetrics                              Lab Results  Component Value Date   WBC 7.0 06/05/2023   HGB 14.2 06/05/2023   HCT 44.4 06/05/2023   MCV 78.2 06/05/2023   PLT 323.0 06/05/2023   Lab Results  Component Value Date   CREATININE 0.88 06/05/2023   BUN 20 06/05/2023   NA 139 06/05/2023   K 4.4 06/05/2023   CL 102 06/05/2023   CO2 27 06/05/2023   Lab Results  Component Value Date   INR 0.9 06/05/2023   INR 1.0 08/23/2019   INR 1.01 11/27/2017    Anesthesia Physical Anesthesia Plan  ASA: 3  Anesthesia Plan: General   Post-op Pain Management:    Induction: Intravenous  PONV Risk Score and Plan: 4 or greater and Propofol infusion, Ondansetron, Treatment may vary due to age or medical condition and Dexamethasone  Airway Management Planned: Oral ETT  Additional Equipment: None  Intra-op Plan:    Post-operative Plan: Extubation in OR  Informed Consent: I have reviewed the patients History and Physical, chart, labs and discussed the procedure including the risks, benefits and alternatives for the proposed anesthesia with the patient or authorized representative who has indicated his/her understanding and acceptance.     Dental advisory given  Plan Discussed with: CRNA and Anesthesiologist  Anesthesia Plan Comments:          Anesthesia Quick Evaluation

## 2023-06-22 NOTE — Op Note (Signed)
Orthopedic Spine Surgery Operative Report  Procedure: L4/5 lumbar laminectomy with intraspinal, extradural mass excision (lumbar facet cyst)  Modifier: none  Date of procedure: 06/22/2023  Patient name: Michelle Kennedy MRN: [id number] DOB: 02-May-1956  Surgeon: Willia Craze, MD Assistant: None Pre-operative diagnosis: lumbar stenosis, L4/5 facet cyst, lumbar radiculopathy Post-operative diagnosis: same as above Findings: facet cyst near the left L4/5 facet joint with clear gelatinous contents  Specimens: none Anesthesia: general EBL: 150cc Complications: none Pre-incision antibiotic: clindamycin  Implants: none   Indication for procedure: Patient is a 68 y.o. female who presented to the office with symptoms consistent with lumbar radiculopathy The patient had tried conservative treatments that did not provide any lasting relief. As result, operative management was discussed. The pre-operative MRI showed stenosis with a facet cyst at L4/5 so a L4/5 laminectomy with facet cyst excision was presented as a treatment option. The risks including but not limited to iatrogenic instability, dural tear, cyst recurrence, nerve root injury, paralysis, persistent pain, infection, bleeding, heart attack, death, fracture, and need for additional procedures were discussed with the patient. The benefit of the surgery would be relief of the patient's radiating leg pain. The alternatives to surgical management were covered with the patient and included continued monitoring, physical therapy, over-the-counter pain medications, ambulatory aids, injections, and activity modification. All the patient's questions were answered to her satisfaction. After this discussion, the patient expressed understanding and elected to proceed with surgical intervention.   Procedure Description: The patient was met in the pre-operative holding area. The patient's identity and consent were verified. The operative site was  marked. The patient's remaining questions about the surgery were answered. The patient was brought back to the operating room. General anesthesia was induced and an endotracheal tube was placed by the anesthesia staff. The patient was transferred to the prone Bronte table in the prone position. All bony prominences were well padded. The head of the bed was slightly elevated and the eyes were free from compression by the face pillow. The surgical area was cleansed with alcohol. Fluoroscopy was then brought in to check rotation on the AP image and to mark the levels on the lateral image. The patient's skin was then prepped and draped in a standard, sterile fashion. A time out was performed that identified the patient, the procedure, and the operative levels. All team members agreed with what was stated in the time out.   A midline incision over the spinous processes of the previously marked levels was made and sharp dissection was continued down through the skin and dermis. Electrocautery was then used to continue the midline dissection down to the level of the spinous process. Subperiosteal dissection was performed using electrocautery to expose the lamina out lateral to the facet joint capsule. Care was taken to not violate the facet joint capsules. A lateral fluoroscopic image was taken to confirm the level. Subperiosteal dissection with electrocautery was then done to expose the lamina and pars at L4 and the cranial aspect of the L5 lamina. Again, care was taken to avoid disruption of the facet capsules.    A rongeur was used to remove the spinous processes and interspinous ligaments between the L3/4 interspinous ligament to the cranial portion of the L5 spinous process. Bone wax was used to obtain hemostasis at the bleeding bony surfaces. A high-speed burr was used to thin the lamina at L4 and L5 to the level of the ligamentum flavum. Above the level of the ligamentum, the lamina was thinned with the  burr to  the approximate level of the ligamentum. Care was taken to leave at least 8mm of pars interarticularis on each side. A series of Kerrison rongeurs were used to remove the remaining lamina and ligamentum overlying the thecal sac. A cystic structure was seen near the L4/5 facet on the left side. A kerrison was used to remove the cyst. Gelatinous clear substance was seen emanating from the cyst. A woodsen was used to protect the thecal sac as a kerrison was used to remove the medial portion of the facet joints bilaterally.   A woodsen was placed into the laminectomy site to palpate for any remaining areas of stenosis. No further areas of stenosis were felt. No further cyst material was seen. No further gelatinous material was seen.  The wound was copiously irrigated with sterile saline. 1g of vancomycin powder was placed into the wound. The fascia was reapproximated with 0 vicryl suture. The subcutaneous fat was reapproximated with 0 vicryl suture. The deep dermal layer was reapproximated with 2-0 vicryl. The skin as closed with a 3-0 running monocryl. All counts were correct at the end of the case. The incision was covered with dermabond. An island dressing was placed over the wound. The patient was transferred back to a bed and brought to the post-anesthesia care unit by anesthesia staff in stable condition.  Post-operative plan: The patient will recover in the post-anesthesia care unit and then go to the floor. The patient will receive two post-operative doses of clindamycin. She will get another dose of TXA. The patient will be out of bed as tolerated with no brace. The patient will work with physical therapy. The patient will likely discharge to home in the next day or two.   Willia Craze, MD Orthopedic Surgeon

## 2023-06-22 NOTE — Anesthesia Procedure Notes (Signed)
Procedure Name: Intubation Date/Time: 06/22/2023 1:04 PM  Performed by: Shary Decamp, CRNAPre-anesthesia Checklist: Patient identified, Patient being monitored, Timeout performed, Emergency Drugs available and Suction available Patient Re-evaluated:Patient Re-evaluated prior to induction Oxygen Delivery Method: Circle System Utilized Preoxygenation: Pre-oxygenation with 100% oxygen Induction Type: IV induction Ventilation: Mask ventilation without difficulty Laryngoscope Size: Miller and 2 Grade View: Grade I Tube type: Oral Tube size: 7.0 mm Number of attempts: 1 Airway Equipment and Method: Stylet Placement Confirmation: ETT inserted through vocal cords under direct vision, positive ETCO2 and breath sounds checked- equal and bilateral Secured at: 21 cm Tube secured with: Tape Dental Injury: Teeth and Oropharynx as per pre-operative assessment

## 2023-06-22 NOTE — Transfer of Care (Signed)
Immediate Anesthesia Transfer of Care Note  Patient: Michelle Kennedy  Procedure(s) Performed: LUMBAR FOUR/FIVE LAMINECTOMY WITH FACET CYST EXCISION  Patient Location: PACU  Anesthesia Type:General  Level of Consciousness: awake, alert , and oriented  Airway & Oxygen Therapy: Patient Spontanous Breathing  Post-op Assessment: Report given to RN and Post -op Vital signs reviewed and stable  Post vital signs: Reviewed and stable  Last Vitals:  Vitals Value Taken Time  BP 123/67 06/22/23 1730  Temp    Pulse 100 06/22/23 1731  Resp 20 06/22/23 1731  SpO2 95 % 06/22/23 1731  Vitals shown include unfiled device data.  Last Pain:  Vitals:   06/22/23 1129  TempSrc:   PainSc: 7       Patients Stated Pain Goal: 0 (06/22/23 1129)  Complications: No notable events documented.

## 2023-06-22 NOTE — Progress Notes (Addendum)
Orthopedic Surgery Post-operative Progress Note  Assessment: Patient is a 68 y.o. female who is currently admitted after undergoing L4/5 laminectomy and facet cyst excision   Plan: -Operative plans complete -Drains: none -Out of bed as tolerated, no brace -No bending/lifting/twisting greater than 10 pounds -OT evaluate and treat -Pain control -Heart healthy diet -No chemoprophylaxis for dvt or antiplatelets for 72 hours after surgery -Clindamycin x2 post-operative doses -Disposition: to floor from PACU  ___________________________________________________________________________   Subjective: No acute events since surgery. Pain well controlled in PACU. No pain radiating into her lower extremities. Her pain is in the back at this moment.   Objective:  General: no acute distress, appropriate affect Neurologic: alert, answering questions appropriately, following commands Respiratory: unlabored breathing on nasal canula Skin: dressing clear/dry/intact  MSK (spine):  -Strength exam      Right  Left  EHL    5/5  5/5 TA    5/5  5/5 GSC    5/5  5/5 Knee extension  5/5  5/5 Hip flexion   5/5  5/5  -Sensory exam    Sensation intact to light touch in L2-S1 nerve distributions of bilateral lower extremities   Patient name: Michelle Kennedy Patient MRN: [id number] Date: 06/22/23

## 2023-06-23 DIAGNOSIS — M48061 Spinal stenosis, lumbar region without neurogenic claudication: Secondary | ICD-10-CM | POA: Diagnosis not present

## 2023-06-23 LAB — BASIC METABOLIC PANEL
Anion gap: 10 (ref 5–15)
BUN: 11 mg/dL (ref 8–23)
CO2: 25 mmol/L (ref 22–32)
Calcium: 9.2 mg/dL (ref 8.9–10.3)
Chloride: 106 mmol/L (ref 98–111)
Creatinine, Ser: 0.82 mg/dL (ref 0.44–1.00)
GFR, Estimated: >60 mL/min (ref >60)
Glucose, Bld: 127 mg/dL — ABNORMAL HIGH (ref 70–99)
Potassium: 4.3 mmol/L (ref 3.5–5.1)
Sodium: 141 mmol/L (ref 135–145)

## 2023-06-23 LAB — CBC
HCT: 42.9 % (ref 36.0–46.0)
Hemoglobin: 13.5 g/dL (ref 12.0–15.0)
MCH: 24.7 pg — ABNORMAL LOW (ref 26.0–34.0)
MCHC: 31.5 g/dL (ref 30.0–36.0)
MCV: 78.4 fL — ABNORMAL LOW (ref 80.0–100.0)
Platelets: 249 10*3/uL (ref 150–400)
RBC: 5.47 MIL/uL — ABNORMAL HIGH (ref 3.87–5.11)
RDW: 15.8 % — ABNORMAL HIGH (ref 11.5–15.5)
WBC: 9.2 10*3/uL (ref 4.0–10.5)
nRBC: 0 % (ref 0.0–0.2)

## 2023-06-23 LAB — GLUCOSE, CAPILLARY: Glucose-Capillary: 93 mg/dL (ref 70–99)

## 2023-06-23 MED ORDER — ACETAMINOPHEN 500 MG PO TABS: 1000.0000 mg | ORAL_TABLET | Freq: Three times a day (TID) | ORAL | 0 refills | Status: AC

## 2023-06-23 MED ORDER — POLYETHYLENE GLYCOL 3350 17 G PO PACK: 17.0000 g | PACK | Freq: Every day | ORAL | 0 refills | Status: AC

## 2023-06-23 MED ORDER — OXYCODONE HCL 5 MG PO TABS: 5.0000 mg | ORAL_TABLET | ORAL | 0 refills | Status: AC | PRN

## 2023-06-23 MED ORDER — METHOCARBAMOL 750 MG PO TABS: 750.0000 mg | ORAL_TABLET | Freq: Four times a day (QID) | ORAL | 0 refills | Status: AC

## 2023-06-23 MED ORDER — SENNA 8.6 MG PO TABS: 1.0000 | ORAL_TABLET | Freq: Two times a day (BID) | ORAL | 0 refills | Status: AC

## 2023-06-23 NOTE — Discharge Summary (Signed)
Orthopedic Surgery Discharge Summary  Patient name: Michelle Kennedy Patient MRN: [id number] Admit today: 06/22/2023 Discharge date: 06/23/2023  Attending physician: Willia Craze, MD Final diagnosis: lumbar stenosis, lumbar radiculopathy, L4/5 facet cyst Findings: facet cyst near the left L4/5 facet joint with clear gelatinous contents   Hospital course: Patient is a 68 y.o. female who was admitted after undergoing L4/5 laminectomy with facet cyst excision. The patient had significant pain immediately after surgery, but pain eventually was controlled with a multimodal regimen including oxycodone. Labs during the hospitalization revealed no significant anemia or electrolyte abnormality. The patient worked with physical therapy who recommended discharge to home. The patient was tolerating an oral diet without issue and was voiding spontaneously after surgery. The patient's vitals were stable on the day of discharge. The patient was medically ready for discharge and was discharge to home on post-operative day one.  Instructions:   Orthopedic Surgery Discharge Instructions  Patient name: Michelle Kennedy Procedure Performed: L4/5 laminectomy with facet cyst excision Date of Surgery: 06/22/2023 Surgeon: Willia Craze, MD  Pre-operative Diagnosis: :L4/5 stenosis, L4/5 facet cyst Post-operative Diagnosis: same as above  Discharge Date: 06/23/2023 Discharged to: home Discharge Condition: stable  Activity: You should refrain from bending, lifting, or twisting with objects greater than ten pounds until six weeks after surgery. You are encouraged to walk as much as desired. You can perform household activities such as cleaning dishes, doing laundry, vacuuming, etc. as long as the ten-pound restriction is followed. You do not need to wear a brace during the post-operative period.   Incision Care: Your incision site has a dressing over it. That dressing should remain in place and dry at all times for  a total of one week after surgery. After one week, you can remove the dressing. Underneath the dressing, you will find pieces of tape. You should leave these pieces of tape in place. They will fall off with time. Do not pick, rub, or scrub at them. Do not put cream or lotion over the surgical area. After one week and once the dressing is off, it is okay to let soap and water run over your incision. Again, do not pick, scrub, or rub at the pieces of tape when bathing. Do not submerge (e.g., take a bath, swim, go in a hot tub, etc.) until six weeks after surgery. There may be some bloody drainage from the incision into the dressing after surgery. This is normal. You do not need to replace the dressing. Continue to leave it in place for the one week as instructed above. Should the dressing become saturated with blood or drainage, please call the office for further instructions.   Medications: You have been prescribed oxycodone. This is a narcotic pain medication and should only be taken as prescribed. You should not drink alcohol or operate heavy machinery (including driving) while taking this medication. The oxycodone can cause constipation as a side effect. For that reason, you have been prescribed senna and miralax. These are both laxatives. You do not need to take this medication if you develop diarrhea. Should you remain constipated even while taking these medications, please increase the dose of miralax to twice daily. Tylenol has been prescribed to be taken every 8 hours, which will give you additional pain relief. Robaxin is a muscle relaxer that has been prescribed to you for muscle spasm type pain. Take this medication for the first several days after surgery.    You can use over-the-counter NSAIDs (ibuprofen, Aleve, Celebrex, naproxen,  meloxicam, etc.) starting 72 hours after surgery for additional pain relief. These medications are safe to take with the Tylenol you have been prescribed. You should not  take these medications if you have or have had kidney problems or gastrointestinal ulcers. Take these medications as instructed on the packaging.   In order to set expectations for opioid prescriptions, you will only be prescribed opioids for a total of six weeks after surgery and, at two-weeks after surgery, your opioid prescription will start to tapered (decreased dosage and number of pills). If you have ongoing need for opioid medication six weeks after surgery, you will be referred to pain management. If you are already established with a provider that is giving you opioid medications, you should schedule an appointment with them for six weeks after surgery if you feel you are going to need another prescription. State law only allows for opioid prescriptions one week at a time. If you are running out of opioid medication near the end of the week, please call the office during business hours before running out so I can send you another prescription.   You may resume any home blood thinners (warfarin, lovenox, apixaban, plavix, xarelto, etc) 72 hours after your surgery. Take these medications as they were previously prescribed.  Driving: You should not drive while taking narcotic pain medications. You should start getting back to driving slowly and you may want to try driving in a parking lot before doing anything more.   Diet: You are safe to resume your regular diet after surgery.   Reasons to Call the Office After Surgery: You should feel free to call the office with any concerns or questions you have in the post-operative period, but you should definitely notify the office if you develop: -shortness of breath, chest pain, or trouble breathing -excessive bleeding, drainage, redness, or swelling around the surgical site -fevers, chills, or pain that is getting worse with each passing day -persistent nausea or vomiting -new weakness in either leg -new or worsening numbness or tingling in either  leg -numbness in the groin, bowel or bladder incontinence -other concerns about your surgery  Follow Up Appointments: You should have an office appointment scheduled for approximately two weeks after surgery. If you do not remember when this appointment is or do not already have it scheduled, please call the office to schedule.   Office Information:  -Willia Craze, MD -Phone number: [phone] -Address: [address]       [place], [place] [address]

## 2023-06-23 NOTE — Evaluation (Signed)
Occupational Therapy Evaluation and DC Summary  Patient Details Name: Michelle Kennedy MRN: [id number] DOB: 18-May-1956 Today's Date: 06/23/2023   History of Present Illness Pt is a 68 yo female presenting to [facility] for chronic low back pain that radiates into BLEs, dx of lumbar radiculopathy from a lumbar facet cyst at L4-L5. Pt is s/p L4-L5 laminectomy on 06/22/23. PMH HLD, HTN, CAD, Fibromyalgia, OSA, COPD, CHF, Glaucoma, Achilles repair   Clinical Impression   Pt admitted for above, some lingering back pain but pt doing well and recalls back precautions without fall. Pt ind in ambulation no AD, educated pt on compensatory strategies prn for ADLs and transfers to which she is also ind in. Discussed reacher use at home for laundry. Pt has no further acute skilled OT needs. No post acute OT needs either.        If plan is discharge home, recommend the following: Assistance with cooking/housework    Functional Status Assessment  Patient has not had a recent decline in their functional status  Equipment Recommendations  None recommended by OT    Recommendations for Other Services       Precautions / Restrictions Precautions Precautions: Back Precaution Booklet Issued: Yes (comment) Precaution Comments: review back preacutions with pt and spouse Restrictions Weight Bearing Restrictions Per Provider Order: No      Mobility Bed Mobility Overal bed mobility: Independent             General bed mobility comments: Pt ind with log roll    Transfers Overall transfer level: Independent Equipment used: None                      Balance Overall balance assessment: No apparent balance deficits (not formally assessed)                                         ADL either performed or assessed with clinical judgement   ADL Overall ADL's : Independent                                       General ADL Comments: Pt ind with full body  dressing and ambulatory no AD. 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         Pertinent Vitals/Pain Pain Assessment Pain Assessment: 0-10 Pain Score: 5  Pain Location: back Pain Descriptors / Indicators: Aching, Discomfort Pain Intervention(s): Premedicated before session, Monitored during session, Limited activity within patient's tolerance (premed half hour ago)     Extremity/Trunk Assessment Upper Extremity Assessment Upper Extremity Assessment: Overall WFL for tasks assessed   Lower Extremity Assessment Lower Extremity Assessment: Overall WFL for tasks assessed (numbness has resolved)   Cervical / Trunk Assessment Cervical / Trunk Assessment: Back Surgery   Communication Communication Communication: No apparent difficulties   Cognition Arousal: Alert Behavior During Therapy: WFL for tasks assessed/performed Overall Cognitive Status: Within Functional Limits for tasks assessed  General Comments  Pt has bidet at home    Exercises     Shoulder Instructions      Home Living Family/patient expects to be discharged to:: Private residence Living Arrangements: Spouse/significant other Available Help at Discharge: Family;Available 24 hours/day Type of Home: House Home Access: Level entry     Home Layout: One level     Bathroom Shower/Tub: Walk-in shower (and conventional tub)   Bathroom Toilet: Standard Bathroom Accessibility: Yes How Accessible: Accessible via walker Home Equipment: Shower seat;Toilet riser;Rolling Walker (2 wheels);Grab bars - tub/shower;Grab bars - toilet;Cane - quad;Crutches;Adaptive equipment Adaptive Equipment: Reacher        Prior Functioning/Environment Prior Level of Function : Independent/Modified Independent;Driving             Mobility  Comments: Ind no AD ADLs Comments: Ind        OT Problem List: Pain;Obesity      OT Treatment/Interventions:      OT Goals(Current goals can be found in the care plan section) Acute Rehab OT Goals Patient Stated Goal: TO go home OT Goal Formulation: All assessment and education complete, DC therapy Time For Goal Achievement: 07/07/23 Potential to Achieve Goals: Good  OT Frequency:      Co-evaluation              AM-PAC OT "6 Clicks" Daily Activity     Outcome Measure Help from another person eating meals?: None Help from another person taking care of personal grooming?: None Help from another person toileting, which includes using toliet, bedpan, or urinal?: None Help from another person bathing (including washing, rinsing, drying)?: None Help from another person to put on and taking off regular upper body clothing?: None Help from another person to put on and taking off regular lower body clothing?: None 6 Click Score: 24   End of Session Nurse Communication: Mobility status  Activity Tolerance: Patient tolerated treatment well Patient left: in bed;with call bell/phone within reach  OT Visit Diagnosis: Pain Pain - Right/Left:  (back)                Time: [id number] OT Time Calculation (min): 19 min Charges:  OT General Charges $OT Visit: 1 Visit OT Evaluation $OT Eval Low Complexity: 1 Low  06/23/2023  AB, OTR/L  Acute Rehabilitation Services  Office: [phone]   Tristan Schroeder 06/23/2023, 11:13 AM

## 2023-06-23 NOTE — Progress Notes (Addendum)
Orthopedic Surgery Post-operative Progress Note  Assessment: Patient is a 68 y.o. female who is currently admitted after undergoing L4/5 laminectomy and facet cyst excision   Plan: -Operative plans complete -Drains: none -Out of bed as tolerated, no brace -No bending/lifting/twisting greater than 10 pounds -OT evaluate and treat -Pain control -Heart healthy diet -No chemoprophylaxis for dvt or antiplatelets for 72 hours after surgery -Clindamycin x2 post-operative doses -Anticipate discharge to home today  ___________________________________________________________________________   Subjective: No acute events since surgery. Pain well controlled with oral medication. Radiating leg pain has improved. Reports sensation in her buttocks is back. Has been up in her room walking.   Objective:  General: no acute distress, appropriate affect Neurologic: alert, answering questions appropriately, following commands Respiratory: unlabored breathing on nasal canula Skin: dressing clear/dry/intact  MSK (spine):  -Strength exam      Right  Left  EHL    5/5  5/5 TA    5/5  5/5 GSC    5/5  5/5 Knee extension  5/5  5/5 Hip flexion   5/5  5/5  -Sensory exam    Sensation intact to light touch in L2-S1 nerve distributions of bilateral lower extremities   Patient name: Michelle Kennedy Patient MRN: [id number] Date: 06/23/23

## 2023-06-23 NOTE — Discharge Instructions (Signed)
Orthopedic Surgery Discharge Instructions  Patient name: Michelle Kennedy Procedure Performed: L4/5 laminectomy with facet cyst excision Date of Surgery: 06/22/2023 Surgeon: Willia Craze, MD  Pre-operative Diagnosis: :L4/5 stenosis, L4/5 facet cyst Post-operative Diagnosis: same as above  Discharge Date: 06/23/2023 Discharged to: home Discharge Condition: stable  Activity: You should refrain from bending, lifting, or twisting with objects greater than ten pounds until six weeks after surgery. You are encouraged to walk as much as desired. You can perform household activities such as cleaning dishes, doing laundry, vacuuming, etc. as long as the ten-pound restriction is followed. You do not need to wear a brace during the post-operative period.   Incision Care: Your incision site has a dressing over it. That dressing should remain in place and dry at all times for a total of one week after surgery. After one week, you can remove the dressing. Underneath the dressing, you will find pieces of tape. You should leave these pieces of tape in place. They will fall off with time. Do not pick, rub, or scrub at them. Do not put cream or lotion over the surgical area. After one week and once the dressing is off, it is okay to let soap and water run over your incision. Again, do not pick, scrub, or rub at the pieces of tape when bathing. Do not submerge (e.g., take a bath, swim, go in a hot tub, etc.) until six weeks after surgery. There may be some bloody drainage from the incision into the dressing after surgery. This is normal. You do not need to replace the dressing. Continue to leave it in place for the one week as instructed above. Should the dressing become saturated with blood or drainage, please call the office for further instructions.   Medications: You have been prescribed oxycodone. This is a narcotic pain medication and should only be taken as prescribed. You should not drink alcohol or operate  heavy machinery (including driving) while taking this medication. The oxycodone can cause constipation as a side effect. For that reason, you have been prescribed senna and miralax. These are both laxatives. You do not need to take this medication if you develop diarrhea. Should you remain constipated even while taking these medications, please increase the dose of miralax to twice daily. Tylenol has been prescribed to be taken every 8 hours, which will give you additional pain relief. Robaxin is a muscle relaxer that has been prescribed to you for muscle spasm type pain. Take this medication for the first several days after surgery.    You can use over-the-counter NSAIDs (ibuprofen, Aleve, Celebrex, naproxen, meloxicam, etc.) starting 72 hours after surgery for additional pain relief. These medications are safe to take with the Tylenol you have been prescribed. You should not take these medications if you have or have had kidney problems or gastrointestinal ulcers. Take these medications as instructed on the packaging.   In order to set expectations for opioid prescriptions, you will only be prescribed opioids for a total of six weeks after surgery and, at two-weeks after surgery, your opioid prescription will start to tapered (decreased dosage and number of pills). If you have ongoing need for opioid medication six weeks after surgery, you will be referred to pain management. If you are already established with a provider that is giving you opioid medications, you should schedule an appointment with them for six weeks after surgery if you feel you are going to need another prescription. State law only allows for opioid prescriptions one  week at a time. If you are running out of opioid medication near the end of the week, please call the office during business hours before running out so I can send you another prescription.   You may resume any home blood thinners (warfarin, lovenox, apixaban, plavix,  xarelto, etc) 72 hours after your surgery. Take these medications as they were previously prescribed.  Driving: You should not drive while taking narcotic pain medications. You should start getting back to driving slowly and you may want to try driving in a parking lot before doing anything more.   Diet: You are safe to resume your regular diet after surgery.   Reasons to Call the Office After Surgery: You should feel free to call the office with any concerns or questions you have in the post-operative period, but you should definitely notify the office if you develop: -shortness of breath, chest pain, or trouble breathing -excessive bleeding, drainage, redness, or swelling around the surgical site -fevers, chills, or pain that is getting worse with each passing day -persistent nausea or vomiting -new weakness in either leg -new or worsening numbness or tingling in either leg -numbness in the groin, bowel or bladder incontinence -other concerns about your surgery  Follow Up Appointments: You should have an office appointment scheduled for approximately two weeks after surgery. If you do not remember when this appointment is or do not already have it scheduled, please call the office to schedule.   Office Information:  -Willia Craze, MD -Phone number: [phone] -Address: [address]       [place], [place] [address]

## 2023-06-23 NOTE — Anesthesia Postprocedure Evaluation (Signed)
Anesthesia Post Note  Patient: Michelle Kennedy  Procedure(s) Performed: LUMBAR FOUR/FIVE LAMINECTOMY WITH FACET CYST EXCISION     Patient location during evaluation: PACU Anesthesia Type: General Level of consciousness: awake and alert Pain management: pain level controlled Vital Signs Assessment: post-procedure vital signs reviewed and stable Respiratory status: spontaneous breathing, nonlabored ventilation, respiratory function stable and patient connected to nasal cannula oxygen Cardiovascular status: blood pressure returned to baseline and stable Postop Assessment: no apparent nausea or vomiting Anesthetic complications: no   No notable events documented.  Last Vitals:  Vitals:   06/22/23 2200 06/23/23 0516  BP: 121/74 117/61  Pulse: 84 75  Resp: 18 18  Temp: 36.6 C 36.6 C  SpO2: 96% 100%    Last Pain:  Vitals:   06/23/23 0605  TempSrc:   PainSc: 0-No pain                 Nelle Don Tharon Bomar

## 2023-06-24 ENCOUNTER — Encounter (HOSPITAL_COMMUNITY): Payer: Self-pay | Admitting: Orthopedic Surgery

## 2023-06-27 ENCOUNTER — Encounter: Payer: Self-pay | Admitting: Family

## 2023-06-27 ENCOUNTER — Other Ambulatory Visit: Payer: Self-pay | Admitting: Family

## 2023-06-27 ENCOUNTER — Ambulatory Visit: Payer: Medicare Other | Admitting: Podiatry

## 2023-06-27 MED ORDER — ALBUTEROL SULFATE HFA 108 (90 BASE) MCG/ACT IN AERS: INHALATION_SPRAY | RESPIRATORY_TRACT | 5 refills | Status: DC

## 2023-06-27 NOTE — Telephone Encounter (Unsigned)
 Copied from CRM [id number]. Topic: Clinical - Medication Refill >> Jun 27, 2023  8:07 AM Joanell NOVAK wrote: Most Recent Primary Care Visit:  Provider: O'SULLIVAN, MELISSA  Department: [facility]  Visit Type: OFFICE VISIT  Date: 06/05/2023  Medication: albuterol  (VENTOLIN  HFA) 108 (90 Base) MCG/ACT inhaler  Has the patient contacted their pharmacy? Yes (Agent: If no, request that the patient contact the pharmacy for the refill. If patient does not wish to contact the pharmacy document the reason why and proceed with request.) (Agent: If yes, when and what did the pharmacy advise?)Pt stated that she contacted the pharmacy and they informed her to contact us    Is this the correct pharmacy for this prescription? Yes If no, delete pharmacy and type the correct one.  This is the patient's preferred pharmacy:  [facility] [address] [place], [place] - [address] [place] [address] Phone: [phone] Fax: [phone]  Has the prescription been filled recently? No  Is the patient out of the medication? Yes  Has the patient been seen for an appointment in the last year OR does the patient have an upcoming appointment? {yes/no:20286}  Can we respond through MyChart? {yes/no:20286}  Agent: Please be advised that Rx refills may take up to 3 business days. We ask that you follow-up with your pharmacy.

## 2023-06-28 ENCOUNTER — Encounter: Payer: Self-pay | Admitting: Family

## 2023-07-04 ENCOUNTER — Ambulatory Visit (INDEPENDENT_AMBULATORY_CARE_PROVIDER_SITE_OTHER): Payer: Medicare Other

## 2023-07-04 ENCOUNTER — Ambulatory Visit: Payer: Medicare Other | Admitting: Family Medicine

## 2023-07-04 ENCOUNTER — Other Ambulatory Visit: Payer: Self-pay

## 2023-07-04 VITALS — BP 102/72 | HR 78 | Ht 66.0 in

## 2023-07-04 DIAGNOSIS — R0602 Shortness of breath: Secondary | ICD-10-CM

## 2023-07-04 DIAGNOSIS — M1711 Unilateral primary osteoarthritis, right knee: Secondary | ICD-10-CM

## 2023-07-04 DIAGNOSIS — M25561 Pain in right knee: Secondary | ICD-10-CM

## 2023-07-04 DIAGNOSIS — G8929 Other chronic pain: Secondary | ICD-10-CM

## 2023-07-04 MED ORDER — TRIAMCINOLONE ACETONIDE 32 MG IX SRER
32.0000 mg | Freq: Once | INTRA_ARTICULAR | Status: AC
  Administered 2023-07-04: 32 mg via INTRA_ARTICULAR

## 2023-07-04 NOTE — Progress Notes (Signed)
Rubin Payor, PhD, LAT, ATC acting as a scribe for [name], MD.  Michelle Kennedy is a 68 y.o. female who presents to [facility] at [facility] today for exacerbation of his R knee pain. Pt was last seen for her knee on 03/19/23 and was given a repeat Zilretta injection.  Today, pt reports R knee pain returned about 3wks ago. Swelling present laterally. Pt is wanting to repeat the Zilretta injection today.    She notes SOB and wheezing since she was intubated since her surgery.  She recently had back surgery about 2 weeks ago.  She does have a history of asthma.  Dx imaging: 07/28/21 R knee XR   Pertinent review of systems: No fevers or chills  Relevant historical information: History of asthma   Exam:  BP 102/72   Pulse 78   Ht 5\' 6"  (1.676 m)   SpO2 97%   BMI 40.84 kg/m  General: Well Developed, well nourished, and in no acute distress.   MSK: Right knee large joint effusion.  Normal motion.  Lungs: At rest normal work of breathing.  With walking patient has increased shortness of breath and work of breathing.  Crackle and rub audible left lower lung field.    Lab and Radiology Results   Zilretta injection right knee Procedure: Real-time Ultrasound Guided Injection of right knee joint superior lateral patellar space Device: Philips Affiniti 50G Images permanently stored and available for review in PACS Verbal informed consent obtained.  Discussed risks and benefits of procedure. Warned about infection, hyperglycemia bleeding, damage to structures among others. Patient expresses understanding and agreement Time-out conducted.   Noted no overlying erythema, induration, or other signs of local infection.   Skin prepped in a sterile fashion.   Local anesthesia: Topical Ethyl chloride.   With sterile technique and under real time ultrasound guidance: Zilretta 32 mg injected into knee joint. Fluid seen entering the joint capsule.   Completed  without difficulty   Advised to call if fevers/chills, erythema, induration, drainage, or persistent bleeding.   Images permanently stored and available for review in the ultrasound unit.  Impression: Technically successful ultrasound guided injection.  Lot number: [id number]   2 view chest x-ray images obtained today personally and independently interpreted. No acute infiltrate.  No pleural effusion.  No significant change compared to chest x-ray June 05, 2023 Await formal radiology review    Assessment and Plan: 68 y.o. female with chronic right knee pain.  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  Plan for Zilretta injection today.  Incidentally she did have some shortness of breath.  This is likely secondary to her mild asthma and her recent surgery.  Chest x-ray is clear per my read today however radiology overread is still pending. Watchful waiting and follow-up with PCP if needed.  PDMP not reviewed this encounter. Orders Placed This Encounter  Procedures   [place] LIMITED JOINT SPACE STRUCTURES LOW RIGHT(NO LINKED CHARGES)    Reason for Exam (SYMPTOM  OR DIAGNOSIS REQUIRED):   right knee pain    Preferred imaging location?:   [facility] Chest 2 View    Standing Status:   Future    Number of Occurrences:   1    Expiration Date:   07/03/2024    Reason for Exam (SYMPTOM  OR DIAGNOSIS REQUIRED):   eval SOB had surgery 2 weeks ago. Crackles left lower lung field    Preferred imaging  location?:   [facility] ordered this encounter  Medications   Triamcinolone Acetonide (ZILRETTA) intra-articular injection 32 mg     Discussed warning signs or symptoms. Please see discharge instructions. Patient expresses understanding.   The above documentation has been reviewed and is accurate and complete [name], M.D.

## 2023-07-04 NOTE — Patient Instructions (Signed)
Thank you for coming in today.   Call or go to the ER if you develop a large red swollen joint with extreme pain or oozing puss.    Please get an Xray today before you leave   Let me know how you feel.

## 2023-07-05 ENCOUNTER — Encounter: Payer: Self-pay | Admitting: Radiology

## 2023-07-05 ENCOUNTER — Ambulatory Visit (INDEPENDENT_AMBULATORY_CARE_PROVIDER_SITE_OTHER): Payer: Medicare Other | Admitting: Orthopedic Surgery

## 2023-07-05 DIAGNOSIS — Z9889 Other specified postprocedural states: Secondary | ICD-10-CM

## 2023-07-05 NOTE — Progress Notes (Addendum)
Orthopedic Surgery Post-operative Office Visit  Procedure: L4/5 laminectomy with facet cyst excision Date of Surgery: 06/22/2023 (~2 weeks post-op)  Assessment: Patient is a 68 y.o. who has noticed significant improvement in her radiating leg pain since surgery   Plan: -Operative plans complete -Out of bed as tolerated, no brace -No bending/lifting/twisting greater than 10 pounds -Okay to let soap/water run over incision but do not submerge -Pain management: OTC medications -Return to office in 4 weeks, lumbar x-rays needed at next visit: None  ___________________________________________________________________________   Subjective: Patient has been doing well since discharge from the hospital.  She initially did noticed significant back pain around the incision but that has improved over the last week.  She is taking 1 oxycodone per day to help with the back pain.  She says she mostly uses at night as it helps her sleep as well.  She otherwise is using over-the-counter medications to control her pain.  Her radiating leg pain has improved significantly since surgery.  She is not having any pain radiating into either leg.  Has not noticed any redness or drainage around the incision.  Objective:  General: no acute distress, appropriate affect Neurologic: alert, answering questions appropriately, following commands Respiratory: unlabored breathing on room air Skin: incision is well-approximated with no erythema, induration, active/expressible drainage  MSK (spine):  -Strength exam      Left  Right  EHL    5/5  5/5 TA    5/5  5/5 GSC    5/5  5/5 Knee extension  5/5  5/5 Hip flexion   5/5  5/5  -Sensory exam    Sensation intact to light touch in L2-S1 nerve distributions of bilateral lower extremities  Imaging: None obtained today's visit   Patient name: Michelle Kennedy Patient MRN: [id number] Date of visit: 07/05/23

## 2023-07-11 ENCOUNTER — Other Ambulatory Visit: Payer: Self-pay

## 2023-07-11 MED ORDER — CLOPIDOGREL BISULFATE 75 MG PO TABS: 75.0000 mg | ORAL_TABLET | Freq: Every day | ORAL | 1 refills | Status: DC

## 2023-07-13 NOTE — Telephone Encounter (Signed)
Pt received Zilretta inj for RIGHT knee OA on 07/04/23 Can consider repeat inj on or after 09/27/23

## 2023-07-15 ENCOUNTER — Encounter: Payer: Self-pay | Admitting: Family

## 2023-07-15 DIAGNOSIS — R7303 Prediabetes: Secondary | ICD-10-CM

## 2023-07-16 ENCOUNTER — Other Ambulatory Visit: Payer: Self-pay | Admitting: *Deleted

## 2023-07-16 MED ORDER — PANTOPRAZOLE SODIUM 40 MG PO TBEC: 40.0000 mg | DELAYED_RELEASE_TABLET | Freq: Every day | ORAL | 3 refills | Status: DC

## 2023-07-19 ENCOUNTER — Ambulatory Visit (INDEPENDENT_AMBULATORY_CARE_PROVIDER_SITE_OTHER): Payer: Medicare Other | Admitting: Bariatrics

## 2023-07-19 ENCOUNTER — Encounter: Payer: Self-pay | Admitting: Bariatrics

## 2023-07-19 VITALS — BP 125/79 | HR 59 | Temp 97.6°F | Ht 66.0 in | Wt 247.0 lb

## 2023-07-19 DIAGNOSIS — F5089 Other specified eating disorder: Secondary | ICD-10-CM | POA: Diagnosis not present

## 2023-07-19 DIAGNOSIS — R7303 Prediabetes: Secondary | ICD-10-CM | POA: Diagnosis not present

## 2023-07-19 DIAGNOSIS — Z6839 Body mass index (BMI) 39.0-39.9, adult: Secondary | ICD-10-CM | POA: Diagnosis not present

## 2023-07-19 DIAGNOSIS — E66812 Obesity, class 2: Secondary | ICD-10-CM

## 2023-07-19 MED ORDER — TOPIRAMATE 50 MG PO TABS: 50.0000 mg | ORAL_TABLET | Freq: Every day | ORAL | 0 refills | Status: DC

## 2023-07-19 MED ORDER — METFORMIN HCL 500 MG PO TABS: 1000.0000 mg | ORAL_TABLET | Freq: Two times a day (BID) | ORAL | 0 refills | Status: DC

## 2023-07-19 NOTE — Progress Notes (Signed)
 WEIGHT SUMMARY AND BIOMETRICS  Weight Lost Since Last Visit: 6lb  Weight Gained Since Last Visit: 0   Vitals Temp: 97.6 F (36.4 C) BP: 125/79 Pulse Rate: (!) 59 SpO2: 97 %   Anthropometric Measurements Height: 5\' 6"  (1.676 m) Weight: 247 lb (112 kg) BMI (Calculated): 39.89 Weight at Last Visit: 253lb Weight Lost Since Last Visit: 6lb Weight Gained Since Last Visit: 0 Starting Weight: 275lb Total Weight Loss (lbs): 28 lb (12.7 kg)   Body Composition  Body Fat %: 50.4 % Fat Mass (lbs): 124.6 lbs Muscle Mass (lbs): 116.4 lbs Total Body Water (lbs): 89.2 lbs Visceral Fat Rating : 17   Other Clinical Data Fasting: no Labs: no Today's Visit #: 16 Starting Date: 07/28/21    OBESITY Ariba is here to discuss her progress with her obesity treatment plan along with follow-up of her obesity related diagnoses.    Nutrition Plan: the Category 4 plan - 60% adherence.  Current exercise: none due to back surgery 3 weeks ago.  Interim History:  She is down 6 pounds since her last visit.  She has been trying to adhere more to her diet.  Her exercise has been limited as 3 weeks ago she had back surgery and is now recuperating but has been allowed to go back to her aerobics.  Eating all of the food on the plan., Protein intake is as prescribed, Is not skipping meals, and Water intake is adequate.   Pharmacotherapy: Janice is on Metformin 500 mg twice daily with meals Adverse side effects: None Hunger is moderately controlled.  Cravings are moderately controlled.  Assessment/Plan:   Eating disorder/emotional eating Zonie has had issues with stress eating, emotional eating, and nighttime eating. Currently this is moderately controlled. Overall mood is stable. Denies suicidal/homicidal ideation. Medication(s): none  Plan:  Motivational interviewing as  well as evidence-based interventions for health behavior change were utilized today including the discussion of self monitoring techniques, problem-solving barriers and SMART goal setting techniques.  Discussed distractions to curb eating behaviors. Discussed activities to do with one's hands in the evening  Be sure to get adequate rest as lack of rest can trigger appetite.  Have plan in place for stressful events.  Consider other rewards besides food.   Will have healthy snacks at home.  Rx: Topamax 50 mg to take with supper daily, #30 with no refills.  Prediabetes Last A1c was 5.6  Medication(s): Metformin 500 mg twice daily with meals Lab Results  Component Value Date   HGBA1C 5.6 06/19/2023   HGBA1C 5.9 02/21/2023   HGBA1C 5.7 (A) 10/03/2022   HGBA1C 5.7 (H) 07/06/2022   HGBA1C 5.7 (A) 02/02/2022   Lab Results  Component Value Date   INSULIN 18.0 07/06/2022   INSULIN 25.6 (H) 07/28/2021   INSULIN 21.8 09/20/2016    Plan: Will minimize all refined carbohydrates both sweets and  starches.  Will work on the plan and exercise.  Consider both aerobic and resistance training.  Will keep protein, water, and fiber intake high.  Increase Polyunsaturated and Monounsaturated fats to increase satiety and encourage weight loss.  Aim for 7 to 9 hours of sleep nightly.  Will continue medications.  Continue and increase dose Metformin 1,000 mg Bid with food daily.  Rx: Metformin 500 mg take 2 in the AM and 2 in the PM, 1 month supply with no refills.    Morbid Obesity: Current BMI BMI (Calculated): 39.89   Pharmacotherapy Plan Continue and increase dose  Metformin 500 mg 2 tablets in the a.m. and 2 tablets in the p.m. daily.  Cumi is currently in the action stage of change. As such, her goal is to continue with weight loss efforts.  She has agreed to keeping a food journal with goal of 1,600 calories and 90 grams of protein daily.  Exercise goals: as tolerated exercise.    Behavioral modification strategies: decreasing simple carbohydrates , no meal skipping, decrease eating out, meal planning , increase water intake, planning for success, increasing vegetables, increasing lower sugar fruits, and decrease snacking .  Gailene has agreed to follow-up with our clinic in 4 weeks.       Objective:   VITALS: Per patient if applicable, see vitals. GENERAL: Alert and in no acute distress. CARDIOPULMONARY: No increased WOB. Speaking in clear sentences.  PSYCH: Pleasant and cooperative. Speech normal rate and rhythm. Affect is appropriate. Insight and judgement are appropriate. Attention is focused, linear, and appropriate.  NEURO: Oriented as arrived to appointment on time with no prompting.   Attestation Statements:    This was prepared with the assistance of Engineer, civil (consulting).  Occasional wrong-word or sound-a-like substitutions may have occurred due to the inherent limitations of voice recognition.  Corinna Capra, DO

## 2023-07-20 ENCOUNTER — Ambulatory Visit (INDEPENDENT_AMBULATORY_CARE_PROVIDER_SITE_OTHER): Payer: Medicare Other | Admitting: Podiatry

## 2023-07-20 ENCOUNTER — Encounter: Payer: Self-pay | Admitting: Family Medicine

## 2023-07-20 DIAGNOSIS — M79675 Pain in left toe(s): Secondary | ICD-10-CM

## 2023-07-20 DIAGNOSIS — M79674 Pain in right toe(s): Secondary | ICD-10-CM

## 2023-07-20 DIAGNOSIS — B351 Tinea unguium: Secondary | ICD-10-CM

## 2023-07-20 MED ORDER — EPINEPHRINE 0.3 MG/0.3ML IJ SOAJ: 0.3000 mg | INTRAMUSCULAR | 1 refills | Status: DC | PRN

## 2023-07-20 NOTE — Progress Notes (Signed)
  Subjective:  Patient ID: Michelle Kennedy, female    DOB: [birth year],  MRN: [id number]  Chief Complaint  Patient presents with   Nail Problem    Nail fungus follow up   68 y.o. female returns for the above complaint.  Patient presents with thickened onychodystrophy mycotic toenails x 10 mild pain on palpation worse with ambulation is with pressure patient would like for me debride down she is not regularly self denies any other acute issues    Objective:  There were no vitals filed for this visit. Podiatric Exam: Vascular: dorsalis pedis and posterior tibial pulses are palpable bilateral. Capillary return is immediate. Temperature gradient is WNL. Skin turgor WNL  Sensorium: Normal Semmes Weinstein monofilament test. Normal tactile sensation bilaterally. Nail Exam: Pt has thick disfigured discolored nails with subungual debris noted bilateral entire nail hallux through fifth toenails.  Pain on palpation to the nails. Ulcer Exam: There is no evidence of ulcer or pre-ulcerative changes or infection. Orthopedic Exam: Muscle tone and strength are WNL. No limitations in general ROM. No crepitus or effusions noted.  Skin: No Porokeratosis. No infection or ulcers    Assessment & Plan:   1. Pain due to onychomycosis of toenails of both feet     Patient was evaluated and treated and all questions answered.  Onychomycosis with pain  -Nails palliatively debrided as below. -Educated on self-care  Procedure: Nail Debridement Rationale: pain  Type of Debridement: manual, sharp debridement. Instrumentation: Nail nipper, rotary burr. Number of Nails: 10  Procedures and Treatment: Consent by patient was obtained for treatment procedures. The patient understood the discussion of treatment and procedures well. All questions were answered thoroughly reviewed. Debridement of mycotic and hypertrophic toenails, 1 through 5 bilateral and clearing of subungual debris. No ulceration, no infection  noted.  Return Visit-Office Procedure: Patient instructed to return to the office for a follow up visit 3 months for continued evaluation and treatment.  Nicholes Rough, DPM    No follow-ups on file.

## 2023-07-20 NOTE — Progress Notes (Signed)
Chest x-ray looks okay.

## 2023-07-23 MED ORDER — AMLODIPINE BESYLATE 5 MG PO TABS: 5.0000 mg | ORAL_TABLET | Freq: Every day | ORAL | 1 refills | Status: DC

## 2023-07-23 MED ORDER — ROSUVASTATIN CALCIUM 5 MG PO TABS: 5.0000 mg | ORAL_TABLET | Freq: Every day | ORAL | 1 refills | Status: DC

## 2023-07-23 NOTE — Addendum Note (Signed)
 Addended by: Conrad [place] D on: 07/23/2023 12:41 PM   Modules accepted: Orders

## 2023-07-26 ENCOUNTER — Encounter: Payer: Self-pay | Admitting: Family

## 2023-07-28 MED ORDER — HEPATITIS A VACCINE 50 UNIT/ML IM SUSP: INTRAMUSCULAR | 2 refills | Status: DC

## 2023-07-28 MED ORDER — TYPHOID VACCINE PO CPDR: DELAYED_RELEASE_CAPSULE | ORAL | 0 refills | Status: DC

## 2023-07-29 ENCOUNTER — Encounter: Payer: Self-pay | Admitting: Family

## 2023-07-30 ENCOUNTER — Other Ambulatory Visit: Payer: Self-pay

## 2023-07-30 ENCOUNTER — Encounter: Payer: Self-pay | Admitting: Pulmonary Disease

## 2023-07-30 MED ORDER — ROSUVASTATIN CALCIUM 5 MG PO TABS: 5.0000 mg | ORAL_TABLET | Freq: Every day | ORAL | 1 refills | Status: DC

## 2023-07-30 MED ORDER — PANTOPRAZOLE SODIUM 40 MG PO TBEC: 40.0000 mg | DELAYED_RELEASE_TABLET | Freq: Every day | ORAL | 3 refills | Status: DC

## 2023-07-30 MED ORDER — ALBUTEROL SULFATE (2.5 MG/3ML) 0.083% IN NEBU: 2.5000 mg | INHALATION_SOLUTION | Freq: Four times a day (QID) | RESPIRATORY_TRACT | 5 refills | Status: DC | PRN

## 2023-07-30 MED ORDER — PANTOPRAZOLE SODIUM 40 MG PO TBEC: 40.0000 mg | DELAYED_RELEASE_TABLET | Freq: Every day | ORAL | 0 refills | Status: DC

## 2023-07-30 NOTE — Telephone Encounter (Signed)
 Refills sent to [facility]

## 2023-07-31 ENCOUNTER — Other Ambulatory Visit (HOSPITAL_COMMUNITY): Payer: Self-pay

## 2023-07-31 NOTE — Telephone Encounter (Signed)
 Albuterol nebulizer solution must be processed under Medicare Part B- please resend script with appropriate ICD 10 code for the pharmacy to process.

## 2023-08-01 ENCOUNTER — Other Ambulatory Visit: Payer: Self-pay

## 2023-08-01 MED ORDER — MONTELUKAST SODIUM 10 MG PO TABS: 10.0000 mg | ORAL_TABLET | Freq: Every day | ORAL | 1 refills | Status: DC

## 2023-08-01 MED ORDER — FUROSEMIDE 20 MG PO TABS: ORAL_TABLET | ORAL | 3 refills | Status: DC

## 2023-08-01 MED ORDER — AMLODIPINE BESYLATE 5 MG PO TABS: 5.0000 mg | ORAL_TABLET | Freq: Every day | ORAL | 1 refills | Status: DC

## 2023-08-01 MED ORDER — EPINEPHRINE 0.3 MG/0.3ML IJ SOAJ: 0.3000 mg | INTRAMUSCULAR | 1 refills | Status: AC | PRN

## 2023-08-01 MED ORDER — LISINOPRIL 20 MG PO TABS: 40.0000 mg | ORAL_TABLET | Freq: Every day | ORAL | 1 refills | Status: DC

## 2023-08-01 NOTE — Telephone Encounter (Signed)
Prescriptions sent as requested

## 2023-08-02 ENCOUNTER — Ambulatory Visit (INDEPENDENT_AMBULATORY_CARE_PROVIDER_SITE_OTHER): Payer: Medicare Other | Admitting: Orthopedic Surgery

## 2023-08-02 ENCOUNTER — Other Ambulatory Visit: Payer: Self-pay

## 2023-08-02 ENCOUNTER — Telehealth: Payer: Self-pay

## 2023-08-02 DIAGNOSIS — Z9889 Other specified postprocedural states: Secondary | ICD-10-CM

## 2023-08-02 NOTE — Progress Notes (Signed)
 Orthopedic Surgery Post-operative Office Visit   Procedure: L4/5 laminectomy with facet cyst excision Date of Surgery: 06/22/2023 (~6 weeks post-op)   Assessment: Patient is a 68 y.o. who is doing well since surgery. Has noted significant improvement in her back and leg pain.     Plan: -Operative plans complete -No spine specific precautions -Okay to submerge wound at this time -Pain management: tylenol  -For her shoulder, recommended voltaren gel and aleve 500mg  BID for 10 days. Told her to take it with plenty of fluids -Return to office in 6 weeks, x-rays needed at next visit: AP/lateral/flex/ex lumbar   ___________________________________________________________________________     Subjective: Patient has been doing well since she was last seen in the office.  She said she has some soreness around the left side of her back but that has been improving.  She is having no pain radiating to either lower extremity.  She is taking Tylenol for pain control.  She is not using any other medications.  She has not noticed any redness or drainage around her incision.  She is pleased with her surgical outcome so far.  She has been noticing some pain over her right shoulder.  She feels it over the anterior aspect of the shoulder.  She cannot recall any trauma or injury that preceded the onset of pain.  She said it started sometime after surgery.  She notes the pain mostly with activity and when using the shoulder.  Sometimes she will have it at rest.  She has no pain radiating down the arm.  She is not having any neck pain.  She has no left upper extremity pain.  She has not tried any specific treatment for this so far.   Objective:   General: no acute distress, appropriate affect Neurologic: alert, answering questions appropriately, following commands Respiratory: unlabored breathing on room air Skin: incision is well healed with no erythema, induration, active/expressible drainage   MSK  (spine):   -Strength exam                                                   Left                  Right   EHL                              5/5                  5/5 TA                                 5/5                  5/5 GSC                             5/5                  5/5 Knee extension            5/5                  5/5 Hip flexion  5/5                  5/5   -Sensory exam                           Sensation intact to light touch in L2-S1 nerve distributions of bilateral lower extremities  -Right shoulder exam: TTP over the bicipital groove otherwise nontender palpation over the remainder of the shoulder, positive speeds, pain but no weakness with Jobe, negative belly press, no weakness with external rotation with arm at side, negative Yergason's    Imaging: None obtained today's visit     Patient name: Raynetta Osterloh Patient MRN: [id number] Date of visit: 08/02/23

## 2023-08-02 NOTE — Telephone Encounter (Signed)
 [facility] and confirmed both medications were already sent to this location. They are on hold due to picking up sooner than insurance will pay for. Patient trying to pick up because she is going on trip. Advised, per pharmacy she can pick up epi pen tomorrow and inhaler after the 15th.   Copied from CRM [id number]. Topic: Clinical - Prescription Issue >> Aug 01, 2023 11:09 AM Michelle Kennedy wrote: Reason for CRM: Patient needs her epi pen and liquid inhaler sent to the correct pharmacy on file, she is going out of town Friday 3/14 [facility] [place], [place] - [address]. Also would like a call back regarding the Hepatitis A vaccine and typhoid vaccine, says insurance won't cover through pharmacy and pharmacy says it may be covered under part b if done in office.

## 2023-08-02 NOTE — Telephone Encounter (Signed)
 Copied from CRM [id number]. Topic: Clinical - Prescription Issue >> Aug 01, 2023  4:38 PM Michelle Kennedy wrote: Reason for CRM: *2nd request* Patient medications were sent to the wrong place again this morning. Patient needs her epi pen and liquid inhaler sent to the correct pharmacy on file, she is going out of town Friday 3/14 [facility] - [place], [place] - [address]

## 2023-08-10 ENCOUNTER — Ambulatory Visit: Payer: Self-pay

## 2023-08-10 NOTE — Telephone Encounter (Signed)
 Copied from CRM [id number]. Topic: Clinical - Red Word Triage >> Aug 10, 2023 10:50 AM Michelle Kennedy wrote: Red Word that prompted transfer to Nurse Triage: Bruising >> Aug 10, 2023 10:52 AM Michelle Kennedy wrote: Patient states she's supposed to be on blood thinners, but haven't been taking them. Now she's experiencing some bruising on both arms, stomach and legs now and continuing to spread. Patient also states they're deep colored and experiencing some diarrhea also.  Chief Complaint: diarrhea, bruising Symptoms: bruising all over body without injuries - dark purple colored, diarrhea-loose/watery stools Frequency: x 2 day for diarrhea and bruises since taking Lovenox Pertinent Negatives: Patient denies abd pain or blood in stool or urine Disposition: [] ED /[] Urgent Care (no appt availability in office) / [x] Appointment(In office/virtual)/ []  [facility]/ [] Home Care/ [] Refused Recommended Disposition /[] [facility]/ []  Follow-up with PCP Additional Notes: currently still taking Plavix: stopped taking Lovenox as directed.  Gums are bleeding when brush teeth and this does not normally happen.  Reason for Disposition  [1] Mild diarrhea (e.g., 1-3 or more stools than normal in past 24 hours) without known cause AND [2] present >  7 days  [1] Not caused by an injury AND [2] < 5 unexplained bruises  Answer Assessment - Initial Assessment Questions 1. APPEARANCE of BRUISE: "Describe the bruise."      Deep black purple color 2. SIZE: "How large is the bruise?"      2 1/2 x 3 1/2"  3. NUMBER: "How many bruises are there?"      Approx 10-15 4. LOCATION: "Where is the bruise located?"      Arms, upper legs, stomach 5. ONSET: "How long ago did the bruise occur?"      Started occurring when started lovenox and still coming  6. CAUSE: "Tell me how it happened."     no 7. MEDICAL HISTORY: "Do you have any medical problems that can cause easy bruising or bleeding?" (e.g., leukemia,  liver disease, recent chemotherapy)     no 8. MEDICINES: "Do you take any medications which thin the blood such as: aspirin, heparin, ibuprofen (NSAIDS), Plavix, or Coumadin?"     plavix 9. OTHER SYMPTOMS: "Do you have any other symptoms?"  (e.g., weakness, dizziness, pain, fever, nosebleed, blood in urine/stool)     no 10. PREGNANCY: "Is there any chance you are pregnant?" "When was your last menstrual period?"       N/a  Answer Assessment - Initial Assessment Questions 1. DIARRHEA SEVERITY: "How bad is the diarrhea?" "How many more stools have you had in the past 24 hours than normal?"    - NO DIARRHEA (SCALE 0)   - MILD (SCALE 1-3): Few loose or mushy BMs; increase of 1-3 stools over normal daily number of stools; mild increase in ostomy output.   -  MODERATE (SCALE 4-7): Increase of 4-6 stools daily over normal; moderate increase in ostomy output.   -  SEVERE (SCALE 8-10; OR "WORST POSSIBLE"): Increase of 7 or more stools daily over normal; moderate increase in ostomy output; incontinence.     moderate 2. ONSET: "When did the diarrhea begin?"      X 2 days 3. BM CONSISTENCY: "How loose or watery is the diarrhea?"      Loose watery 4. VOMITING: "Are you also vomiting?" If Yes, ask: "How many times in the past 24 hours?"  nausea     ABDOMEN PAIN: "Are you having any abdomen pain?" If Yes, ask: "What does  it feel like?" (e.g., crampy, dull, intermittent, constant)      no 6. ABDOMEN PAIN SEVERITY: If present, ask: "How bad is the pain?"  (e.g., Scale 1-10; mild, moderate, or severe)   - MILD (1-3): doesn't interfere with normal activities, abdomen soft and not tender to touch    - MODERATE (4-7): interferes with normal activities or awakens from sleep, abdomen tender to touch    - SEVERE (8-10): excruciating pain, doubled over, unable to do any normal activities       0 7. ORAL INTAKE: If vomiting, "Have you been able to drink liquids?" "How much liquids have you had in the past 24  hours?"     Drinking lots of water 8. HYDRATION: "Any signs of dehydration?" (e.g., dry mouth [not just dry lips], too weak to stand, dizziness, new weight loss) "When did you last urinate?"     No  9. EXPOSURE: "Have you traveled to a foreign country recently?" "Have you been exposed to anyone with diarrhea?" "Could you have eaten any food that was spoiled?"     no 10. ANTIBIOTIC USE: "Are you taking antibiotics now or have you taken antibiotics in the past 2 months?"       no 11. OTHER SYMPTOMS: "Do you have any other symptoms?" (e.g., fever, blood in stool)       no 12. PREGNANCY: "Is there any chance you are pregnant?" "When was your last menstrual period?"       N/a  Protocols used: Diarrhea-A-AH, Bruises-A-AH

## 2023-08-13 ENCOUNTER — Encounter: Payer: Self-pay | Admitting: Physician Assistant

## 2023-08-13 ENCOUNTER — Ambulatory Visit (INDEPENDENT_AMBULATORY_CARE_PROVIDER_SITE_OTHER): Admitting: Physician Assistant

## 2023-08-13 VITALS — BP 126/77 | HR 72 | Ht 66.0 in | Wt 257.4 lb

## 2023-08-13 DIAGNOSIS — T148XXA Other injury of unspecified body region, initial encounter: Secondary | ICD-10-CM

## 2023-08-13 DIAGNOSIS — R197 Diarrhea, unspecified: Secondary | ICD-10-CM | POA: Diagnosis not present

## 2023-08-13 LAB — CBC WITH DIFFERENTIAL/PLATELET
Basophils Absolute: 0 10*3/uL (ref 0.0–0.1)
Basophils Relative: 0.6 % (ref 0.0–3.0)
Eosinophils Absolute: 0.1 10*3/uL (ref 0.0–0.7)
Eosinophils Relative: 1.5 % (ref 0.0–5.0)
HCT: 41.8 % (ref 36.0–46.0)
Hemoglobin: 13.6 g/dL (ref 12.0–15.0)
Lymphocytes Relative: 25.3 % (ref 12.0–46.0)
Lymphs Abs: 1.3 10*3/uL (ref 0.7–4.0)
MCHC: 32.4 g/dL (ref 30.0–36.0)
MCV: 77.1 fl — ABNORMAL LOW (ref 78.0–100.0)
Monocytes Absolute: 0.6 10*3/uL (ref 0.1–1.0)
Monocytes Relative: 10.8 % (ref 3.0–12.0)
Neutro Abs: 3.2 10*3/uL (ref 1.4–7.7)
Neutrophils Relative %: 61.8 % (ref 43.0–77.0)
Platelets: 292 10*3/uL (ref 150.0–400.0)
RBC: 5.43 Mil/uL — ABNORMAL HIGH (ref 3.87–5.11)
RDW: 15.9 % — ABNORMAL HIGH (ref 11.5–15.5)
WBC: 5.2 10*3/uL (ref 4.0–10.5)

## 2023-08-13 LAB — PROTIME-INR
INR: 1 ratio (ref 0.8–1.0)
Prothrombin Time: 10.2 s (ref 9.6–13.1)

## 2023-08-13 LAB — APTT: aPTT: 30.9 s (ref 25.4–36.8)

## 2023-08-13 MED ORDER — TINIDAZOLE 500 MG PO TABS: 2.0000 g | ORAL_TABLET | Freq: Every day | ORAL | 0 refills | Status: AC

## 2023-08-13 NOTE — Progress Notes (Signed)
 Established patient visit   Patient: Michelle Kennedy   DOB: 07/30/1955   68 y.o. Female  MRN: [id number] Visit Date: 08/13/2023  Today's healthcare provider: Alfredia Ferguson, PA-C   Cc. 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 She d/c lovenox in mid February. Reports significant bruising was present as soon as she started the injections, but large bruises have appeared since even though she has stopped.She is also on plavix for CVA prevention which she is still consistent with.  [place], started w/ diarrhea then, 4-6 episodes a day,  loose + watery, denies blood in stool, mild cramping. Denies fevers.    Medications: Outpatient Medications Prior to Visit  Medication Sig   albuterol (PROVENTIL) (2.5 MG/3ML) 0.083% nebulizer solution Take 3 mLs (2.5 mg total) by nebulization every 6 (six) hours as needed for wheezing or shortness of breath.   albuterol (VENTOLIN HFA) 108 (90 Base) MCG/ACT inhaler USE 2 INHALATIONS ORALLY   EVERY 6 HOURS AS NEEDED FORWHEEZING OR SHORTNESS OF   BREATH.   amLODipine (NORVASC) 5 MG tablet Take 1 tablet (5 mg total) by mouth daily.   betamethasone valerate ointment (VALISONE) 0.1 % Apply 1 Application topically 2 (two) times daily. (Patient taking differently: Apply 1 Application topically 2 (two) times daily as needed (Dermatitis).)   CALCIUM PO Take 1 tablet by mouth every evening.   cholecalciferol (VITAMIN D3) 25 MCG (1000 UNIT) tablet Take 1,000 Units by mouth every evening.    clopidogrel (PLAVIX) 75 MG tablet Take 1 tablet (75 mg total) by mouth daily.   EPINEPHrine 0.3 mg/0.3 mL IJ SOAJ injection Inject 0.3 mg into the muscle as needed for anaphylaxis.   fluticasone-salmeterol (WIXELA INHUB) 500-50 MCG/ACT AEPB Inhale 1 puff into the lungs in the morning and at bedtime. (Patient taking differently: Inhale 1  puff into the lungs 2 (two) times daily as needed (Asthma).)   furosemide (LASIX) 20 MG tablet TAKE 1 TABLET(20 MG) BY MOUTH DAILY AS NEEDED FOR SWELLING   gabapentin (NEURONTIN) 300 MG capsule Take 1 capsule (300 mg total) by mouth 3 (three) times daily.   hepatitis A vaccine (VAQTA) 50 UNIT/ML injection Inject 1 ml now, 1 ml in 6 months and 1 ml in 12 months   latanoprost (XALATAN) 0.005 % ophthalmic solution Place 1 drop into both eyes at bedtime.   levothyroxine (SYNTHROID) 150 MCG tablet Take 1 tablet (150 mcg total) by mouth daily.   lisinopril (ZESTRIL) 20 MG tablet Take 2 tablets (40 mg total) by mouth daily.   meclizine (ANTIVERT) 25 MG tablet Take 1 tablet (25 mg total) by mouth 3 (three) times daily as needed for dizziness.   metFORMIN (GLUCOPHAGE) 500 MG tablet Take 2 tablets (1,000 mg total) by mouth 2 (two) times daily with a meal.   methocarbamol (ROBAXIN) 500 MG tablet Take 500 mg by mouth 4 (four) times daily.   montelukast (SINGULAIR) 10 MG tablet Take 1 tablet (10 mg total) by mouth at bedtime.   Multiple Vitamin (MULTIVITAMIN ADULT) TABS Take 1 tablet by mouth daily.   Multiple Vitamins-Minerals (OCUVITE PO) Take 1 tablet by mouth daily.   Omega-3 Fatty Acids (FISH OIL) 1000 MG CAPS Take 1 capsule by mouth every evening.    ondansetron (ZOFRAN) 4 MG tablet Take 1 tablet (4 mg total) by  mouth every 8 (eight) hours as needed for nausea or vomiting.   pantoprazole (PROTONIX) 40 MG tablet Take 1 tablet (40 mg total) by mouth daily.   pantoprazole (PROTONIX) 40 MG tablet Take 1 tablet (40 mg total) by mouth daily.   rosuvastatin (CRESTOR) 5 MG tablet Take 1 tablet (5 mg total) by mouth daily.   terbinafine (LAMISIL) 250 MG tablet Take 1 tablet (250 mg total) by mouth daily.   topiramate (TOPAMAX) 50 MG tablet Take 1 tablet (50 mg total) by mouth daily with supper.   typhoid (VIVOTIF) DR capsule One capsule by mouth on alternate days (day 1, 3, 5, and 7) for a total of 4 doses;  Start at least 2 weeks before your trip.   [DISCONTINUED] enoxaparin (LOVENOX) 40 MG/0.4ML injection Inject 0.4 mLs (40 mg total) into the skin daily. To begin after surgery   No facility-administered medications prior to visit.    Review of Systems  Constitutional:  Negative for fatigue and fever.  Respiratory:  Negative for cough and shortness of breath.   Cardiovascular:  Negative for chest pain and leg swelling.  Gastrointestinal:  Positive for abdominal pain and diarrhea.  Neurological:  Negative for dizziness and headaches.       Objective    BP 126/77   Pulse 72   Ht 5\' 6"  (1.676 m)   Wt 257 lb 6.4 oz (116.8 kg)   BMI 41.55 kg/m    Physical Exam Constitutional:      General: She is awake.     Appearance: She is well-developed.  HENT:     Head: Normocephalic.  Eyes:     Conjunctiva/sclera: Conjunctivae normal.  Cardiovascular:     Rate and Rhythm: Normal rate and regular rhythm.     Heart sounds: Normal heart sounds.  Pulmonary:     Effort: Pulmonary effort is normal.     Breath sounds: Normal breath sounds.  Abdominal:     Palpations: Abdomen is soft.     Tenderness: There is no abdominal tenderness. There is no guarding.  Skin:    General: Skin is warm.  Neurological:     Mental Status: She is alert and oriented to person, place, and time.  Psychiatric:        Attention and Perception: Attention normal.        Mood and Affect: Mood normal.        Speech: Speech normal.        Behavior: Behavior is cooperative.     No results found for any visits on 08/13/23.  Assessment & Plan    Bruising -     CBC with Differential/Platelet -     Protime-INR -     APTT  Diarrhea, unspecified type -     Tinidazole; Take 4 tablets (2,000 mg total) by mouth daily with breakfast for 1 dose.  Dispense: 4 tablet; Refill: 0  Bruising 2/2 to lovenox and plavix coadministration and bruising is just residual but will check plt and bleeding labs.   Stool samples will  not be back in a timely manner before pt goes on vacation, recommending continued supportive treatment, will tx empirically for giardia  Return if symptoms worsen or fail to improve.       Alfredia Ferguson, PA-C  [facility] [phone] (phone) [phone] (fax)  [facility]

## 2023-08-17 ENCOUNTER — Other Ambulatory Visit: Payer: Self-pay | Admitting: Family

## 2023-08-17 DIAGNOSIS — J4541 Moderate persistent asthma with (acute) exacerbation: Secondary | ICD-10-CM

## 2023-08-17 NOTE — Telephone Encounter (Signed)
 Copied from CRM [id number]. Topic: Clinical - Medication Refill >> Aug 17, 2023  4:29 PM Adaysia C wrote: Most Recent Primary Care Visit:  Provider: Alfredia Ferguson  Department: [facility]  Visit Type: ACUTE  Date: 08/13/2023  Medication: albuterol (PROVENTIL) (2.5 MG/3ML) 0.083% nebulizer solution  Has the patient contacted their pharmacy? Yes, patients pharmacy said they submitted a referral request but did not receive a response from the providers (Agent: If no, request that the patient contact the pharmacy for the refill. If patient does not wish to contact the pharmacy document the reason why and proceed with request.) (Agent: If yes, when and what did the pharmacy advise?)  Is this the correct pharmacy for this prescription? Yes If no, delete pharmacy and type the correct one.  This is the patient's preferred pharmacy:  [facility] [address] [place], [place] - [address] [place] [address] Phone: [phone] Fax: [phone]   Has the prescription been filled recently? Yes  Is the patient out of the medication? Yes  Has the patient been seen for an appointment in the last year OR does the patient have an upcoming appointment? Yes  Can we respond through MyChart? Yes  Agent: Please be advised that Rx refills may take up to 3 business days. We ask that you follow-up with your pharmacy.

## 2023-08-20 NOTE — Telephone Encounter (Unsigned)
 Copied from CRM [id number]. Topic: Clinical - Prescription Issue >> Aug 20, 2023 10:43 AM Drema Balzarine wrote: Reason for CRM: Patient spouse says [facility] needs a call with the diagnosis code for patients Albuterol - Phone: [phone] Fax: [phone]

## 2023-08-21 ENCOUNTER — Other Ambulatory Visit: Payer: Self-pay

## 2023-08-21 MED ORDER — ALBUTEROL SULFATE (2.5 MG/3ML) 0.083% IN NEBU: 2.5000 mg | INHALATION_SOLUTION | Freq: Four times a day (QID) | RESPIRATORY_TRACT | 5 refills | Status: DC | PRN

## 2023-08-21 MED ORDER — ALBUTEROL SULFATE (2.5 MG/3ML) 0.083% IN NEBU
2.5000 mg | INHALATION_SOLUTION | Freq: Four times a day (QID) | RESPIRATORY_TRACT | 5 refills | Status: DC | PRN
Start: 2023-08-21 — End: 2023-09-29

## 2023-08-21 NOTE — Addendum Note (Signed)
 Addended by: Wilford Corner on: 08/21/2023 10:14 AM   Modules accepted: Orders

## 2023-08-21 NOTE — Telephone Encounter (Signed)
 Spoke to pharmacist and new rx sent with diagnosis as requested

## 2023-08-30 ENCOUNTER — Ambulatory Visit: Payer: Medicare Other | Admitting: Bariatrics

## 2023-09-18 ENCOUNTER — Inpatient Hospital Stay: Payer: Medicare Other | Admitting: Hematology and Oncology

## 2023-09-18 NOTE — Assessment & Plan Note (Deleted)
 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 M0 stage IIB Adjuvant chemotherapy Completed 4 cycles of dose dense Adriamycin  and Cytoxan . Followed by Abraxane  X 12 completed 08/18/14, status post radiation completed June 2016, started anastrozole  1 mg daily 11/20/2014 (also enrolled on [name] clinical trial randomized to Ibrance ) completed 2 years 01/23/2017   Breast Cancer Surveillance: 1. Breast exam: 09/18/2023: Benign 2. Mammogram 12/22/2021: Benign breast density category B   Chemo induced peripheral neuropathy Hospitalization: 08/22/2019-08/23/2019 severe headache with right-sided visual disturbance: Stroke work-up negative, started on Plavix  75 mg daily   She is still considers going back to the [place] to be closer to her family.  She has been undecided about [place]  [place] . She lost about 60 pounds with Ozempic .

## 2023-09-21 ENCOUNTER — Telehealth: Payer: Self-pay | Admitting: Family

## 2023-09-21 NOTE — Telephone Encounter (Signed)
 Copied from CRM [id number]. Topic: Medicare AWV >> Sep 21, 2023 11:47 AM Juliana Ocean wrote: Reason for CRM: LVM 09/20/2023 to schedule AWV. Please schedule Virtual or Telehealth visits ONLY  Rosalee Collins; Care Guide Ambulatory Clinical Support Thayne l [facility] Direct Dial: [phone]

## 2023-09-25 NOTE — Telephone Encounter (Signed)
 Reran 09/25/2023 Case number: [id number]

## 2023-09-26 ENCOUNTER — Telehealth: Payer: Self-pay

## 2023-09-26 NOTE — Telephone Encounter (Signed)
 Patient needs an appointment once medication is stocked.   Zilretta  approved for right knee. We are pleased to inform you that Medicare eligible patients with osteoarthritis of the knee are covered for ZILRETTA  (triamcinolone  acetonide extended-release injectable suspension) under the medical benefit (Part B). Patient has a Medicare Part B plan with an effective date of 04/21/2021. Plan follows all Medicare guidelines. Patient responsibility for [phone] (Zilretta ) and CPT code 60454 will be 20% with the remaining covered at 80% by the payer at the contracted rate. This year's Medicare deductible is $257. Deductible has been met. Out of pocket does not apply to these services.  Secondary insurance: Patient has a Medicare Supplement plan G with an effective date of 05/22/2021. This supplemental plan covers the 20% Medicare coinsurance. Plan follows all Medicare guidelines. No prior authorization or referrals needed. As long as you have verified that your patient is active with Medicare (red, white and blue card holders) and their Medicare Supplement plan, you can be confident that ZILRETTA  is covered and inject your patient the day of their visit. Primary's deductible must be met before coverage applies. Patient has a calendar year policy starting from May 23, 2023 to May 21, 2024. Plan's paid through date was 09/20/2023.  Case number: [id number] Exp date: 10/24/2023

## 2023-09-27 ENCOUNTER — Ambulatory Visit: Payer: Self-pay

## 2023-09-27 ENCOUNTER — Other Ambulatory Visit: Payer: Self-pay

## 2023-09-27 ENCOUNTER — Inpatient Hospital Stay (HOSPITAL_BASED_OUTPATIENT_CLINIC_OR_DEPARTMENT_OTHER)
Admission: EM | Admit: 2023-09-27 | Discharge: 2023-09-29 | DRG: 202 | Disposition: A | Discharge status: Home / Self Care | Attending: Internal Medicine | Admitting: Internal Medicine

## 2023-09-27 ENCOUNTER — Emergency Department (HOSPITAL_BASED_OUTPATIENT_CLINIC_OR_DEPARTMENT_OTHER)

## 2023-09-27 ENCOUNTER — Encounter (HOSPITAL_BASED_OUTPATIENT_CLINIC_OR_DEPARTMENT_OTHER): Payer: Self-pay | Admitting: Emergency Medicine

## 2023-09-27 DIAGNOSIS — Z85828 Personal history of other malignant neoplasm of skin: Secondary | ICD-10-CM

## 2023-09-27 DIAGNOSIS — Z833 Family history of diabetes mellitus: Secondary | ICD-10-CM

## 2023-09-27 DIAGNOSIS — J4552 Severe persistent asthma with status asthmaticus: Principal | ICD-10-CM | POA: Diagnosis present

## 2023-09-27 DIAGNOSIS — Z7984 Long term (current) use of oral hypoglycemic drugs: Secondary | ICD-10-CM

## 2023-09-27 DIAGNOSIS — J4489 Other specified chronic obstructive pulmonary disease: Secondary | ICD-10-CM | POA: Diagnosis present

## 2023-09-27 DIAGNOSIS — Z801 Family history of malignant neoplasm of trachea, bronchus and lung: Secondary | ICD-10-CM

## 2023-09-27 DIAGNOSIS — Z881 Allergy status to other antibiotic agents status: Secondary | ICD-10-CM

## 2023-09-27 DIAGNOSIS — Z90710 Acquired absence of both cervix and uterus: Secondary | ICD-10-CM

## 2023-09-27 DIAGNOSIS — Z923 Personal history of irradiation: Secondary | ICD-10-CM

## 2023-09-27 DIAGNOSIS — E66813 Obesity, class 3: Secondary | ICD-10-CM | POA: Diagnosis present

## 2023-09-27 DIAGNOSIS — Z8701 Personal history of pneumonia (recurrent): Secondary | ICD-10-CM

## 2023-09-27 DIAGNOSIS — Z882 Allergy status to sulfonamides status: Secondary | ICD-10-CM

## 2023-09-27 DIAGNOSIS — Z8249 Family history of ischemic heart disease and other diseases of the circulatory system: Secondary | ICD-10-CM

## 2023-09-27 DIAGNOSIS — Z87892 Personal history of anaphylaxis: Secondary | ICD-10-CM

## 2023-09-27 DIAGNOSIS — Z91030 Bee allergy status: Secondary | ICD-10-CM

## 2023-09-27 DIAGNOSIS — J4521 Mild intermittent asthma with (acute) exacerbation: Secondary | ICD-10-CM | POA: Diagnosis not present

## 2023-09-27 DIAGNOSIS — Z83438 Family history of other disorder of lipoprotein metabolism and other lipidemia: Secondary | ICD-10-CM

## 2023-09-27 DIAGNOSIS — E7849 Other hyperlipidemia: Secondary | ICD-10-CM

## 2023-09-27 DIAGNOSIS — Z91013 Allergy to seafood: Secondary | ICD-10-CM

## 2023-09-27 DIAGNOSIS — Z825 Family history of asthma and other chronic lower respiratory diseases: Secondary | ICD-10-CM

## 2023-09-27 DIAGNOSIS — Z8261 Family history of arthritis: Secondary | ICD-10-CM

## 2023-09-27 DIAGNOSIS — K219 Gastro-esophageal reflux disease without esophagitis: Secondary | ICD-10-CM | POA: Diagnosis present

## 2023-09-27 DIAGNOSIS — Z885 Allergy status to narcotic agent status: Secondary | ICD-10-CM

## 2023-09-27 DIAGNOSIS — J4551 Severe persistent asthma with (acute) exacerbation: Secondary | ICD-10-CM | POA: Diagnosis present

## 2023-09-27 DIAGNOSIS — K2100 Gastro-esophageal reflux disease with esophagitis, without bleeding: Secondary | ICD-10-CM

## 2023-09-27 DIAGNOSIS — G894 Chronic pain syndrome: Secondary | ICD-10-CM | POA: Insufficient documentation

## 2023-09-27 DIAGNOSIS — Z886 Allergy status to analgesic agent status: Secondary | ICD-10-CM

## 2023-09-27 DIAGNOSIS — Z86718 Personal history of other venous thrombosis and embolism: Secondary | ICD-10-CM

## 2023-09-27 DIAGNOSIS — Z803 Family history of malignant neoplasm of breast: Secondary | ICD-10-CM

## 2023-09-27 DIAGNOSIS — I10 Essential (primary) hypertension: Secondary | ICD-10-CM | POA: Diagnosis not present

## 2023-09-27 DIAGNOSIS — Z853 Personal history of malignant neoplasm of breast: Secondary | ICD-10-CM

## 2023-09-27 DIAGNOSIS — Z79890 Hormone replacement therapy (postmenopausal): Secondary | ICD-10-CM

## 2023-09-27 DIAGNOSIS — Z8673 Personal history of transient ischemic attack (TIA), and cerebral infarction without residual deficits: Secondary | ICD-10-CM

## 2023-09-27 DIAGNOSIS — Z9221 Personal history of antineoplastic chemotherapy: Secondary | ICD-10-CM

## 2023-09-27 DIAGNOSIS — Z1152 Encounter for screening for COVID-19: Secondary | ICD-10-CM

## 2023-09-27 DIAGNOSIS — Z808 Family history of malignant neoplasm of other organs or systems: Secondary | ICD-10-CM

## 2023-09-27 DIAGNOSIS — Z880 Allergy status to penicillin: Secondary | ICD-10-CM

## 2023-09-27 DIAGNOSIS — Z91040 Latex allergy status: Secondary | ICD-10-CM

## 2023-09-27 DIAGNOSIS — Z7985 Long-term (current) use of injectable non-insulin antidiabetic drugs: Secondary | ICD-10-CM

## 2023-09-27 DIAGNOSIS — Z8719 Personal history of other diseases of the digestive system: Secondary | ICD-10-CM

## 2023-09-27 DIAGNOSIS — Z85850 Personal history of malignant neoplasm of thyroid: Secondary | ICD-10-CM

## 2023-09-27 DIAGNOSIS — J4541 Moderate persistent asthma with (acute) exacerbation: Secondary | ICD-10-CM

## 2023-09-27 DIAGNOSIS — Z96652 Presence of left artificial knee joint: Secondary | ICD-10-CM

## 2023-09-27 DIAGNOSIS — Z9049 Acquired absence of other specified parts of digestive tract: Secondary | ICD-10-CM

## 2023-09-27 DIAGNOSIS — M48061 Spinal stenosis, lumbar region without neurogenic claudication: Secondary | ICD-10-CM | POA: Diagnosis present

## 2023-09-27 DIAGNOSIS — R7303 Prediabetes: Secondary | ICD-10-CM | POA: Diagnosis present

## 2023-09-27 DIAGNOSIS — G4733 Obstructive sleep apnea (adult) (pediatric): Secondary | ICD-10-CM

## 2023-09-27 DIAGNOSIS — H9190 Unspecified hearing loss, unspecified ear: Secondary | ICD-10-CM | POA: Diagnosis present

## 2023-09-27 DIAGNOSIS — Z79899 Other long term (current) drug therapy: Secondary | ICD-10-CM

## 2023-09-27 DIAGNOSIS — Z888 Allergy status to other drugs, medicaments and biological substances status: Secondary | ICD-10-CM

## 2023-09-27 DIAGNOSIS — Z823 Family history of stroke: Secondary | ICD-10-CM

## 2023-09-27 DIAGNOSIS — I110 Hypertensive heart disease with heart failure: Secondary | ICD-10-CM | POA: Diagnosis present

## 2023-09-27 DIAGNOSIS — Z8349 Family history of other endocrine, nutritional and metabolic diseases: Secondary | ICD-10-CM

## 2023-09-27 DIAGNOSIS — Z7902 Long term (current) use of antithrombotics/antiplatelets: Secondary | ICD-10-CM

## 2023-09-27 DIAGNOSIS — Z6841 Body Mass Index (BMI) 40.0 and over, adult: Secondary | ICD-10-CM

## 2023-09-27 DIAGNOSIS — Z8711 Personal history of peptic ulcer disease: Secondary | ICD-10-CM

## 2023-09-27 DIAGNOSIS — J45901 Unspecified asthma with (acute) exacerbation: Secondary | ICD-10-CM | POA: Diagnosis present

## 2023-09-27 DIAGNOSIS — E890 Postprocedural hypothyroidism: Secondary | ICD-10-CM | POA: Diagnosis present

## 2023-09-27 DIAGNOSIS — Z974 Presence of external hearing-aid: Secondary | ICD-10-CM

## 2023-09-27 DIAGNOSIS — M797 Fibromyalgia: Secondary | ICD-10-CM | POA: Diagnosis not present

## 2023-09-27 DIAGNOSIS — M069 Rheumatoid arthritis, unspecified: Secondary | ICD-10-CM | POA: Diagnosis present

## 2023-09-27 DIAGNOSIS — C50412 Malignant neoplasm of upper-outer quadrant of left female breast: Secondary | ICD-10-CM | POA: Diagnosis present

## 2023-09-27 DIAGNOSIS — E785 Hyperlipidemia, unspecified: Secondary | ICD-10-CM | POA: Diagnosis present

## 2023-09-27 DIAGNOSIS — I509 Heart failure, unspecified: Secondary | ICD-10-CM | POA: Diagnosis present

## 2023-09-27 DIAGNOSIS — E039 Hypothyroidism, unspecified: Secondary | ICD-10-CM | POA: Insufficient documentation

## 2023-09-27 DIAGNOSIS — Z841 Family history of disorders of kidney and ureter: Secondary | ICD-10-CM

## 2023-09-27 LAB — BASIC METABOLIC PANEL WITH GFR
Anion gap: 13 (ref 5–15)
BUN: 11 mg/dL (ref 8–23)
CO2: 24 mmol/L (ref 22–32)
Calcium: 9.4 mg/dL (ref 8.9–10.3)
Chloride: 104 mmol/L (ref 98–111)
Creatinine, Ser: 0.79 mg/dL (ref 0.44–1.00)
GFR, Estimated: >60 mL/min (ref >60)
Glucose, Bld: 106 mg/dL — ABNORMAL HIGH (ref 70–99)
Potassium: 4 mmol/L (ref 3.5–5.1)
Sodium: 141 mmol/L (ref 135–145)

## 2023-09-27 LAB — CBC
HCT: 43.2 % (ref 36.0–46.0)
Hemoglobin: 13.4 g/dL (ref 12.0–15.0)
MCH: 24 pg — ABNORMAL LOW (ref 26.0–34.0)
MCHC: 31 g/dL (ref 30.0–36.0)
MCV: 77.4 fL — ABNORMAL LOW (ref 80.0–100.0)
Platelets: 230 10*3/uL (ref 150–400)
RBC: 5.58 MIL/uL — ABNORMAL HIGH (ref 3.87–5.11)
RDW: 15.9 % — ABNORMAL HIGH (ref 11.5–15.5)
WBC: 4.4 10*3/uL (ref 4.0–10.5)
nRBC: 0 % (ref 0.0–0.2)

## 2023-09-27 LAB — PRO BRAIN NATRIURETIC PEPTIDE: Pro Brain Natriuretic Peptide: 126 pg/mL (ref <300.0)

## 2023-09-27 LAB — GLUCOSE, CAPILLARY: Glucose-Capillary: 170 mg/dL — ABNORMAL HIGH (ref 70–99)

## 2023-09-27 LAB — RESP PANEL BY RT-PCR (RSV, FLU A&B, COVID)  RVPGX2
Influenza A by PCR: NEGATIVE
Influenza B by PCR: NEGATIVE
Resp Syncytial Virus by PCR: NEGATIVE
SARS Coronavirus 2 by RT PCR: NEGATIVE

## 2023-09-27 LAB — D-DIMER, QUANTITATIVE: D-Dimer, Quant: 0.7 ug{FEU}/mL — ABNORMAL HIGH (ref 0.00–0.50)

## 2023-09-27 LAB — TROPONIN T, HIGH SENSITIVITY: Troponin T High Sensitivity: <15 ng/L (ref <19)

## 2023-09-27 MED ORDER — ONDANSETRON HCL 4 MG/2ML IJ SOLN: 4.0000 mg | Freq: Four times a day (QID) | INTRAMUSCULAR | Status: DC | PRN

## 2023-09-27 MED ORDER — ALBUTEROL SULFATE (2.5 MG/3ML) 0.083% IN NEBU
2.5000 mg | INHALATION_SOLUTION | Freq: Four times a day (QID) | RESPIRATORY_TRACT | Status: DC | PRN
  Administered 2023-09-28: 2.5 mg via RESPIRATORY_TRACT
  Filled 2023-09-27: qty 3

## 2023-09-27 MED ORDER — METHOCARBAMOL 500 MG PO TABS
500.0000 mg | ORAL_TABLET | Freq: Four times a day (QID) | ORAL | Status: DC
  Administered 2023-09-27 – 2023-09-29 (×6): 500 mg via ORAL
  Filled 2023-09-27 (×6): qty 1

## 2023-09-27 MED ORDER — LEVOTHYROXINE SODIUM 75 MCG PO TABS: 150.0000 ug | ORAL_TABLET | Freq: Every day | ORAL | Status: DC

## 2023-09-27 MED ORDER — ACETAMINOPHEN 325 MG PO TABS
650.0000 mg | ORAL_TABLET | Freq: Four times a day (QID) | ORAL | Status: DC | PRN
  Administered 2023-09-29: 650 mg via ORAL
  Filled 2023-09-27: qty 2

## 2023-09-27 MED ORDER — ONDANSETRON HCL 4 MG PO TABS: 4.0000 mg | ORAL_TABLET | Freq: Four times a day (QID) | ORAL | Status: DC | PRN

## 2023-09-27 MED ORDER — ALBUTEROL (5 MG/ML) CONTINUOUS INHALATION SOLN: 10.0000 mg/h | INHALATION_SOLUTION | RESPIRATORY_TRACT | Status: DC

## 2023-09-27 MED ORDER — LEVOTHYROXINE SODIUM 75 MCG PO TABS
150.0000 ug | ORAL_TABLET | Freq: Every day | ORAL | Status: DC
  Administered 2023-09-28 – 2023-09-29 (×2): 150 ug via ORAL
  Filled 2023-09-27 (×2): qty 2

## 2023-09-27 MED ORDER — HYDRALAZINE HCL 20 MG/ML IJ SOLN
10.0000 mg | Freq: Four times a day (QID) | INTRAMUSCULAR | Status: DC | PRN
  Administered 2023-09-28 (×2): 10 mg via INTRAVENOUS
  Filled 2023-09-27 (×2): qty 1

## 2023-09-27 MED ORDER — ALBUTEROL SULFATE (2.5 MG/3ML) 0.083% IN NEBU
10.0000 mg | INHALATION_SOLUTION | Freq: Once | RESPIRATORY_TRACT | Status: AC
  Administered 2023-09-27: 10 mg via RESPIRATORY_TRACT
  Filled 2023-09-27: qty 12

## 2023-09-27 MED ORDER — GABAPENTIN 300 MG PO CAPS
300.0000 mg | ORAL_CAPSULE | Freq: Three times a day (TID) | ORAL | Status: DC
  Administered 2023-09-27 – 2023-09-29 (×5): 300 mg via ORAL
  Filled 2023-09-27 (×5): qty 1

## 2023-09-27 MED ORDER — ACETAMINOPHEN 650 MG RE SUPP: 650.0000 mg | Freq: Four times a day (QID) | RECTAL | Status: DC | PRN

## 2023-09-27 MED ORDER — AMLODIPINE BESYLATE 5 MG PO TABS
5.0000 mg | ORAL_TABLET | Freq: Every day | ORAL | Status: DC
  Administered 2023-09-28 – 2023-09-29 (×2): 5 mg via ORAL
  Filled 2023-09-27 (×2): qty 1

## 2023-09-27 MED ORDER — ENOXAPARIN SODIUM 60 MG/0.6ML IJ SOSY
60.0000 mg | PREFILLED_SYRINGE | Freq: Every day | INTRAMUSCULAR | Status: DC
  Administered 2023-09-27: 60 mg via SUBCUTANEOUS
  Filled 2023-09-27 (×2): qty 0.6

## 2023-09-27 MED ORDER — LEVALBUTEROL HCL 0.63 MG/3ML IN NEBU
0.6300 mg | INHALATION_SOLUTION | Freq: Four times a day (QID) | RESPIRATORY_TRACT | Status: DC
  Administered 2023-09-28 (×2): 0.63 mg via RESPIRATORY_TRACT
  Filled 2023-09-27 (×2): qty 3

## 2023-09-27 MED ORDER — IPRATROPIUM BROMIDE 0.02 % IN SOLN
1.0000 mg | Freq: Once | RESPIRATORY_TRACT | Status: AC
  Administered 2023-09-27: 1 mg via RESPIRATORY_TRACT
  Filled 2023-09-27: qty 5

## 2023-09-27 MED ORDER — ALBUTEROL SULFATE (2.5 MG/3ML) 0.083% IN NEBU: 10.0000 mg | INHALATION_SOLUTION | Freq: Once | RESPIRATORY_TRACT | Status: AC

## 2023-09-27 MED ORDER — ALBUTEROL SULFATE (2.5 MG/3ML) 0.083% IN NEBU
INHALATION_SOLUTION | RESPIRATORY_TRACT | Status: AC
Start: 2023-09-27 — End: 2023-09-27
  Administered 2023-09-27: 10 mg via RESPIRATORY_TRACT
  Filled 2023-09-27: qty 12

## 2023-09-27 MED ORDER — METFORMIN HCL 500 MG PO TABS
1000.0000 mg | ORAL_TABLET | Freq: Two times a day (BID) | ORAL | Status: DC
  Administered 2023-09-28: 1000 mg via ORAL
  Filled 2023-09-27: qty 2

## 2023-09-27 MED ORDER — BENZONATATE 100 MG PO CAPS
100.0000 mg | ORAL_CAPSULE | Freq: Three times a day (TID) | ORAL | Status: DC | PRN
  Administered 2023-09-27 – 2023-09-28 (×2): 100 mg via ORAL
  Filled 2023-09-27 (×3): qty 1

## 2023-09-27 MED ORDER — METHYLPREDNISOLONE SODIUM SUCC 40 MG IJ SOLR
40.0000 mg | Freq: Two times a day (BID) | INTRAMUSCULAR | Status: DC
Start: 2023-09-27 — End: 2023-09-29
  Administered 2023-09-27 – 2023-09-29 (×4): 40 mg via INTRAVENOUS
  Filled 2023-09-27 (×4): qty 1

## 2023-09-27 MED ORDER — CLOPIDOGREL BISULFATE 75 MG PO TABS
75.0000 mg | ORAL_TABLET | Freq: Every day | ORAL | Status: DC
  Administered 2023-09-28 – 2023-09-29 (×2): 75 mg via ORAL
  Filled 2023-09-27 (×2): qty 1

## 2023-09-27 MED ORDER — MAGNESIUM SULFATE 2 GM/50ML IV SOLN
2.0000 g | INTRAVENOUS | Status: AC
  Administered 2023-09-27: 2 g via INTRAVENOUS
  Filled 2023-09-27: qty 50

## 2023-09-27 MED ORDER — SODIUM CHLORIDE 0.9% FLUSH
3.0000 mL | Freq: Two times a day (BID) | INTRAVENOUS | Status: DC
  Administered 2023-09-27 – 2023-09-29 (×4): 3 mL via INTRAVENOUS

## 2023-09-27 MED ORDER — SODIUM CHLORIDE 0.9% FLUSH: 3.0000 mL | INTRAVENOUS | Status: DC | PRN

## 2023-09-27 MED ORDER — METHYLPREDNISOLONE SODIUM SUCC 125 MG IJ SOLR
125.0000 mg | Freq: Once | INTRAMUSCULAR | Status: AC
  Administered 2023-09-27: 125 mg via INTRAVENOUS
  Filled 2023-09-27: qty 2

## 2023-09-27 MED ORDER — LISINOPRIL 20 MG PO TABS
40.0000 mg | ORAL_TABLET | Freq: Every day | ORAL | Status: DC
  Administered 2023-09-28 – 2023-09-29 (×2): 40 mg via ORAL
  Filled 2023-09-27 (×2): qty 2

## 2023-09-27 MED ORDER — ROSUVASTATIN CALCIUM 5 MG PO TABS
5.0000 mg | ORAL_TABLET | Freq: Every day | ORAL | Status: DC
  Administered 2023-09-28 – 2023-09-29 (×2): 5 mg via ORAL
  Filled 2023-09-27 (×3): qty 1

## 2023-09-27 MED ORDER — MONTELUKAST SODIUM 10 MG PO TABS
10.0000 mg | ORAL_TABLET | Freq: Every day | ORAL | Status: DC
  Administered 2023-09-27 – 2023-09-28 (×2): 10 mg via ORAL
  Filled 2023-09-27 (×2): qty 1

## 2023-09-27 MED ORDER — IPRATROPIUM BROMIDE 0.02 % IN SOLN
0.5000 mg | Freq: Four times a day (QID) | RESPIRATORY_TRACT | Status: DC
Start: 2023-09-27 — End: 2023-09-28
  Administered 2023-09-28 (×2): 0.5 mg via RESPIRATORY_TRACT
  Filled 2023-09-27 (×2): qty 2.5

## 2023-09-27 MED ORDER — PANTOPRAZOLE SODIUM 40 MG PO TBEC
40.0000 mg | DELAYED_RELEASE_TABLET | Freq: Every day | ORAL | Status: DC
  Administered 2023-09-28 – 2023-09-29 (×2): 40 mg via ORAL
  Filled 2023-09-27 (×2): qty 1

## 2023-09-27 MED ORDER — SODIUM CHLORIDE 0.9 % IV SOLN: 250.0000 mL | INTRAVENOUS | Status: AC | PRN

## 2023-09-27 NOTE — H&P (Addendum)
 History and Physical    Michelle Kennedy YQI:[id number] DOB: [birth year] DOA: 09/27/2023  PCP: Dorrene Gaucher, NP   Patient coming from: Home   Chief Complaint:  Chief Complaint  Patient presents with   Shortness of Breath   ED TRIAGE note:  Pt POV slow gait, obvious increased work of breathing, audible wheezing. RT to triage to assess.     Pt reports recent 33 day cruise, reports cough x1.5 week.    Reports using nebulizing tx this am appx 0630, MDI use x3 last use 30 min PTA.             HPI:  Michelle Kennedy is a 68 y.o. female with medical history significant of asthma, essential hypertension, history of breast cancer, history of CVA 2019, history of right upper extremity DVT 2017, chronic neuropathic pain, lumbar spinal stenosis, history of breast cancer 2015, history of thyroid  cancer 1990 and morbid obesity presented to emergency department complaining of shortness of breath which progressively getting worse, wheezing which is not improved with nebulizer treatment and MDI using at home. Patient reported feeling chest tightness bilateral lower lung field with associated dry cough.  Denies any fever, chill, nausea, vomiting and diarrhea.  Denies any chest pain and palpitation.  No other complaint at this time.   ED Course:  At presentation to ED patient O2 sat 96% on room air, tachycardic 104 elevated blood pressure 150/87 respiratory in between 18-25. BMP and CBC unremarkable. Respiratory panel negative for COVID, RSV and flu. Elevated D-dimer 0.7. Normal proBNP and troponin.  Chest x-ray evidence of acute cardiopulmonary process and postsurgical changes in the left breast and left axilla.  In the ED patient has been treated with albuterol  nebulizer 10 mg for 1 hour, DuoNeb mag sulfate and methylprednisolone  25 mg.  Patient has been transferred [facility] for management of asthma exacerbation.  Significant labs in the ED: Lab Orders         Resp  panel by RT-PCR (RSV, Flu A&B, Covid) Anterior Nasal Swab         Basic metabolic panel         CBC         Pro Brain natriuretic peptide         D-dimer, quantitative         Comprehensive metabolic panel         CBC       Review of Systems:  Review of Systems  Constitutional:  Negative for chills, fever, malaise/fatigue and weight loss.  Respiratory:  Positive for cough and wheezing. Negative for sputum production and shortness of breath.   Cardiovascular:  Negative for chest pain and palpitations.  Gastrointestinal:  Negative for abdominal pain, diarrhea, heartburn, nausea and vomiting.  Musculoskeletal:  Negative for myalgias.  Neurological:  Negative for dizziness and headaches.  Psychiatric/Behavioral:  The patient is not nervous/anxious.     Past Medical History:  Diagnosis Date   Abnormally small mouth    Achilles tendon disorder, right    Allergy    Anemia    Anxiety    Arthritis    hips and knees   Asthma    daily inhaler, prn inhaler and neb.   Asthma due to environmental allergies    Basal cell carcinoma    nose, s/p mohs.   Breast cancer (HCC) 01/2014   left   Cataract    bilateral - surgery   Chest pain    CHF (congestive heart failure) (  HCC)    COPD (chronic obstructive pulmonary disease) (HCC)    Dental bridge present    upper front and lower right   Dental crowns present    x 3   Depression    Esophageal spasm    reports since her chemo and breast surgery  she developed esophageal spasms and reports this is in the past has casue her 02 to desat in the  70s , denies sycnope in relation to this , does report hx of vertigo as well    Family history of anesthesia complication    twin brother aspirated and died on OR table, per pt.   Fibromyalgia    Gallbladder disease    Gallstones    GERD (gastroesophageal reflux disease)    Glaucoma    H/O blood clots    had blood to  PICC line    History of gastric ulcer    as a teenager   History of seizure  age 55   as a reaction to Penicillin - no seizures since   History of stomach ulcers    History of thyroid  cancer    s/p thyroidectomy   HTN (hypertension)    Hyperlipidemia    Hypothyroidism    Joint pain    Left knee injury    Liver disorder    seen when she had her hysterectomy , reports she was told it was  " small lesion" ; but asymptomatic    Lymphedema    left arm   Migraines    Multiple food allergies    Obesity    OSA (obstructive sleep apnea) 02/16/2016   CPAP- uses nightly   Palpitations    reports no longer experiences   Personal history of chemotherapy 2015   Personal history of radiation therapy 2015   Left   Pneumonia    x 1 "years ago"   Pre-diabetes    Rheumatoid arthritis (HCC)    Sleep apnea    SOB (shortness of breath)    Swelling of both ankles    Tinnitus    UTI (lower urinary tract infection) 02/17/2014   Vertigo    Vitamin D  deficiency    Wears hearing aid in both ears    hearing loss    Past Surgical History:  Procedure Laterality Date   ABDOMINAL HYSTERECTOMY  2004   complete   ACHILLES TENDON REPAIR Right    APPENDECTOMY  2004   BACK SURGERY  02/09/2023   [facility]   bilateral eye surgery Bilateral 2022   glaucoma   bladder stimulator  08/29/2022   Medtronic bladder stinulator - with remote   BREAST EXCISIONAL BIOPSY     BREAST LUMPECTOMY Left    2015   BREAST LUMPECTOMY WITH NEEDLE LOCALIZATION AND AXILLARY SENTINEL LYMPH NODE BX Left 02/23/2014   Procedure: BREAST LUMPECTOMY WITH NEEDLE LOCALIZATION AND AXILLARY SENTINEL LYMPH NODE BIOPSY;  Surgeon: Ayesha Lente, MD;  Location: [facility];  Service: General;  Laterality: Left;   BREAST SURGERY  2011   left breast biposy   CHOLECYSTECTOMY  1990   COLONOSCOPY W/ POLYPECTOMY  06/2009   DECOMPRESSIVE LUMBAR LAMINECTOMY LEVEL 1 N/A 06/22/2023   Procedure: LUMBAR FOUR/FIVE LAMINECTOMY WITH FACET CYST EXCISION;  Surgeon: Diedra Fowler, MD;   Location: [facility];  Service: Orthopedics;  Laterality: N/A;   EYE SURGERY Right 2013   exc. warts from underneath eyelid   INCONTINENCE SURGERY  2004   KNEE ARTHROSCOPY Bilateral  x 6 each knee   LIGAMENT REPAIR Right    thumb/wrist   ORIF TOE FRACTURE Right    great toe   PORTACATH PLACEMENT N/A 03/24/2014   Procedure: INSERTION PORT-A-CATH;  Surgeon: Ayesha Lente, MD;  Location: [facility];  Service: General;  Laterality: N/A;; removed    THYROIDECTOMY  2000   TONSILLECTOMY AND ADENOIDECTOMY  2000   TOTAL KNEE ARTHROPLASTY Left 12/03/2017   Procedure: LEFT TOTAL KNEE ARTHROPLASTY;  Surgeon: Liliane Rei, MD;  Location: [facility];  Service: Orthopedics;  Laterality: Left;   TUBAL LIGATION  1981   TUMOR EXCISION     from thoracic spine     reports that she has never smoked. She has never used smokeless tobacco. She reports that she does not drink alcohol and does not use drugs.  Allergies  Allergen Reactions   Bee Venom Anaphylaxis   Betadine [Povidone Iodine] Anaphylaxis    Rash. Breathing problems.    Codeine Shortness Of Breath and Rash   Contrast Media [Iodinated Contrast Media] Shortness Of Breath   Gadolinium Derivatives Hives, Rash, Shortness Of Breath and Swelling    "my heart stopped beating"    Iodine Other (See Comments)    CARDIAC ARREST   Latex Anaphylaxis and Rash   Lidocaine  Shortness Of Breath and Swelling    SWELLING OF MOUTH AND THROAT, low BP   Penicillins Shortness Of Breath, Rash and Other (See Comments)    SEIZURE   Pentazocine Lactate Shortness Of Breath and Rash   Shellfish Allergy Shortness Of Breath and Rash   Aspirin Rash and Other (See Comments)    GI UPSET   Erythromycin Swelling and Rash    SWELLING OF JOINTS   Symbicort  [Budesonide -Formoterol  Fumarate] Other (See Comments)    BURNING OF TONGUE AND LIPS   Compazine  [Prochlorperazine  Maleate] Rash    Rash on face,chest, arms, back   Sulfonamide Derivatives Rash     Family History  Problem Relation Age of Onset   Alcohol abuse Mother    Arthritis Mother    Hypertension Mother    Bipolar disorder Mother    Breast cancer Mother 19       unconfirmed   Lung cancer Mother 69       smoker   Hyperlipidemia Mother    Stroke Mother    Cancer Mother    Depression Mother    Anxiety disorder Mother    Alcoholism Mother    Drug abuse Mother    Eating disorder Mother    Obesity Mother    Alcohol abuse Father    Hyperlipidemia Father    Kidney disease Father    Diabetes Father    Hypertension Father    Cystic kidney disease Father    Thyroid  disease Father    Liver disease Father    Alcoholism Father    Lung cancer Father    Thyroid  cancer Sister 78       type?; currently 29   Other Sister        ovarian tumor @ 59; TAH/BSO   Breast cancer Sister    Thyroid  cancer Brother        dx 30s; currently 75   Breast cancer Maternal Aunt        dx 36s; deceased 12   Thyroid  cancer Paternal Aunt        All 3 paternal aunts with thyroid  ca in 30s/40s   Lung cancer Paternal Aunt        2  of 3 paternal aunts with lung cancer   Arthritis Maternal Grandmother    Diabetes Paternal Grandmother    Heart disease Other    COPD Other    Asthma Other     Prior to Admission medications   Medication Sig Start Date End Date Taking? Authorizing Provider  albuterol  (PROVENTIL ) (2.5 MG/3ML) 0.083% nebulizer solution Take 3 mLs (2.5 mg total) by nebulization every 6 (six) hours as needed for wheezing or shortness of breath. 08/21/23   O'Sullivan, Melissa, NP  amLODipine  (NORVASC ) 5 MG tablet Take 1 tablet (5 mg total) by mouth daily. 08/01/23   O'Sullivan, Melissa, NP  betamethasone  valerate ointment (VALISONE ) 0.1 % Apply 1 Application topically 2 (two) times daily. Patient taking differently: Apply 1 Application topically 2 (two) times daily as needed (Dermatitis). 08/10/22   O'Sullivan, Melissa, NP  CALCIUM  PO Take 1 tablet by mouth every evening.    [provider]  cholecalciferol  (VITAMIN D3) 25 MCG (1000 UNIT) tablet Take 1,000 Units by mouth every evening.     [provider]  clopidogrel  (PLAVIX ) 75 MG tablet Take 1 tablet (75 mg total) by mouth daily. 07/11/23   O'Sullivan, Melissa, NP  EPINEPHrine  0.3 mg/0.3 mL IJ SOAJ injection Inject 0.3 mg into the muscle as needed for anaphylaxis. 08/01/23   O'Sullivan, Melissa, NP  fluticasone -salmeterol (WIXELA INHUB) 500-50 MCG/ACT AEPB Inhale 1 puff into the lungs in the morning and at bedtime. Patient taking differently: Inhale 1 puff into the lungs 2 (two) times daily as needed (Asthma). 09/29/22   O'Sullivan, Melissa, NP  furosemide  (LASIX ) 20 MG tablet TAKE 1 TABLET(20 MG) BY MOUTH DAILY AS NEEDED FOR SWELLING 08/01/23   Dorrene Gaucher, NP  gabapentin  (NEURONTIN ) 300 MG capsule Take 1 capsule (300 mg total) by mouth 3 (three) times daily. 06/11/23   O'Sullivan, Melissa, NP  hepatitis A vaccine (VAQTA) 50 UNIT/ML injection Inject 1 ml now, 1 ml in 6 months and 1 ml in 12 months 07/28/23   O'Sullivan, Melissa, NP  latanoprost  (XALATAN ) 0.005 % ophthalmic solution Place 1 drop into both eyes at bedtime. 10/24/20   [provider]  levothyroxine  (SYNTHROID ) 150 MCG tablet Take 1 tablet (150 mcg total) by mouth daily. 06/20/23   Emilie Harden, MD  lisinopril  (ZESTRIL ) 20 MG tablet Take 2 tablets (40 mg total) by mouth daily. 08/01/23   O'Sullivan, Melissa, NP  meclizine  (ANTIVERT ) 25 MG tablet Take 1 tablet (25 mg total) by mouth 3 (three) times daily as needed for dizziness. 02/07/22   O'Sullivan, Melissa, NP  metFORMIN  (GLUCOPHAGE ) 500 MG tablet Take 2 tablets (1,000 mg total) by mouth 2 (two) times daily with a meal. 07/19/23   Kirk Peper A, DO  methocarbamol  (ROBAXIN ) 500 MG tablet Take 500 mg by mouth 4 (four) times daily.    [provider]  montelukast  (SINGULAIR ) 10 MG tablet Take 1 tablet (10 mg total) by mouth at bedtime. 08/01/23   O'Sullivan, Melissa, NP  Multiple  Vitamin (MULTIVITAMIN ADULT) TABS Take 1 tablet by mouth daily.    [provider]  Multiple Vitamins-Minerals (OCUVITE PO) Take 1 tablet by mouth daily.    [provider]  Omega-3 Fatty Acids (FISH OIL) 1000 MG CAPS Take 1 capsule by mouth every evening.     [provider]  ondansetron  (ZOFRAN ) 4 MG tablet Take 1 tablet (4 mg total) by mouth every 8 (eight) hours as needed for nausea or vomiting. 11/15/22   Kirk Peper A, DO  pantoprazole  (PROTONIX )  40 MG tablet Take 1 tablet (40 mg total) by mouth daily. 07/30/23   O'Sullivan, Melissa, NP  pantoprazole  (PROTONIX ) 40 MG tablet Take 1 tablet (40 mg total) by mouth daily. 07/30/23   O'Sullivan, Melissa, NP  rosuvastatin  (CRESTOR ) 5 MG tablet Take 1 tablet (5 mg total) by mouth daily. 07/30/23   O'Sullivan, Melissa, NP  terbinafine  (LAMISIL ) 250 MG tablet Take 1 tablet (250 mg total) by mouth daily. 02/22/23   Velma Ghazi, DPM  topiramate  (TOPAMAX ) 50 MG tablet Take 1 tablet (50 mg total) by mouth daily with supper. 07/19/23   Kirk Peper A, DO  typhoid (VIVOTIF) DR capsule One capsule by mouth on alternate days (day 1, 3, 5, and 7) for a total of 4 doses; Start at least 2 weeks before your trip. 07/28/23   Dorrene Gaucher, NP     Physical Exam: Vitals:   09/27/23 1630 09/27/23 1700 09/27/23 1730 09/27/23 1845  BP: 135/76 136/77 (!) 119/104 (!) 162/77  Pulse: (!) 103 (!) 104 99 (!) 104  Resp: (!) 23 (!) 22 20 18   Temp:    98.1 F (36.7 C)  TempSrc:    Oral  SpO2: 91% 92% 93% 97%  Weight:    119.8 kg  Height:    5\' 7"  (1.702 m)    Physical Exam Vitals and nursing note reviewed.  Constitutional:      General: She is not in acute distress.    Appearance: She is not ill-appearing.  Cardiovascular:     Rate and Rhythm: Normal rate and regular rhythm.  Pulmonary:     Effort: Pulmonary effort is normal. No accessory muscle usage or respiratory distress.     Breath sounds: No stridor. Examination of the  right-upper field reveals wheezing. Examination of the left-upper field reveals wheezing. Examination of the right-middle field reveals wheezing. Examination of the left-middle field reveals wheezing. Wheezing present. No decreased breath sounds, rhonchi or rales.  Musculoskeletal:     Cervical back: Normal range of motion and neck supple.     Right lower leg: No edema.     Left lower leg: No edema.  Skin:    General: Skin is warm.     Capillary Refill: Capillary refill takes less than 2 seconds.     Coloration: Skin is not cyanotic.     Nails: There is no clubbing.  Neurological:     Mental Status: She is oriented to person, place, and time.  Psychiatric:        Mood and Affect: Mood normal. Mood is not anxious.        Behavior: Behavior is not agitated.      Labs on Admission: I have personally reviewed following labs and imaging studies  CBC: Recent Labs  Lab 09/27/23 1221  WBC 4.4  HGB 13.4  HCT 43.2  MCV 77.4*  PLT 230   Basic Metabolic Panel: Recent Labs  Lab 09/27/23 1221  NA 141  K 4.0  CL 104  CO2 24  GLUCOSE 106*  BUN 11  CREATININE 0.79  CALCIUM  9.4   GFR: Estimated Creatinine Clearance: 89 mL/min (by C-G formula based on SCr of 0.79 mg/dL). Liver Function Tests: No results for input(s): "AST", "ALT", "ALKPHOS", "BILITOT", "PROT", "ALBUMIN " in the last 168 hours. No results for input(s): "LIPASE", "AMYLASE" in the last 168 hours. No results for input(s): "AMMONIA" in the last 168 hours. Coagulation Profile: No results for input(s): "INR", "PROTIME" in the last 168 hours. Cardiac Enzymes: No results  for input(s): "CKTOTAL", "CKMB", "CKMBINDEX", "TROPONINI", "TROPONINIHS" in the last 168 hours. BNP (last 3 results) No results for input(s): "BNP" in the last 8760 hours. HbA1C: No results for input(s): "HGBA1C" in the last 72 hours. CBG: No results for input(s): "GLUCAP" in the last 168 hours. Lipid Profile: No results for input(s): "CHOL", "HDL",  "LDLCALC", "TRIG", "CHOLHDL", "LDLDIRECT" in the last 72 hours. Thyroid  Function Tests: No results for input(s): "TSH", "T4TOTAL", "FREET4", "T3FREE", "THYROIDAB" in the last 72 hours. Anemia Panel: No results for input(s): "VITAMINB12", "FOLATE", "FERRITIN", "TIBC", "IRON", "RETICCTPCT" in the last 72 hours. Urine analysis:    Component Value Date/Time   COLORURINE YELLOW 11/27/2017 1011   APPEARANCEUR CLEAR 11/27/2017 1011   LABSPEC 1.011 11/27/2017 1011   PHURINE 6.0 11/27/2017 1011   GLUCOSEU NEGATIVE 11/27/2017 1011   GLUCOSEU NEGATIVE 09/10/2017 1147   HGBUR NEGATIVE 11/27/2017 1011   BILIRUBINUR negative 10/14/2020 1026   BILIRUBINUR neg 02/17/2014 0856   KETONESUR negative 10/14/2020 1026   KETONESUR NEGATIVE 11/27/2017 1011   PROTEINUR trace (A) 10/14/2020 1026   PROTEINUR NEGATIVE 11/27/2017 1011   UROBILINOGEN 0.2 10/14/2020 1026   UROBILINOGEN 0.2 09/10/2017 1147   NITRITE Negative 10/14/2020 1026   NITRITE NEGATIVE 11/27/2017 1011   LEUKOCYTESUR Negative 10/14/2020 1026    Radiological Exams on Admission: I have personally reviewed images DG Chest 2 View Result Date: 09/27/2023 CLINICAL DATA:  Shortness of breath. Slow gait with audible wheezing. EXAM: CHEST - 2 VIEW COMPARISON:  Radiographs 07/04/2023 and 06/05/2023.  CT 07/07/2020. FINDINGS: The heart size and mediastinal contours are stable. There are postsurgical changes within the left breast and left axilla. The lungs appear clear. There is no pleural effusion or pneumothorax. No acute osseous findings are evident. Mild degenerative changes in the spine. IMPRESSION: No evidence of acute cardiopulmonary process. Postsurgical changes in the left breast and left axilla. Electronically Signed   By: Elmon Hagedorn M.D.   On: 09/27/2023 13:37     EKG: My personal interpretation of EKG shows: Normal sinus rhythm heart rate 88    Assessment/Plan: Principal Problem:   Acute asthma exacerbation Active Problems:    GERD   Fibromyalgia   Hyperlipidemia   Breast cancer of upper-outer quadrant of left female breast (HCC)   OSA on CPAP   Essential hypertension   Morbid obesity (HCC)   History of total left knee replacement   Chronic pain syndrome   Hypothyroidism   History of CVA (cerebrovascular accident) 2017   History of DVT (deep vein thrombosis) of right upper extremity 2015    Assessment and Plan: Acute exacerbation of asthma  history of asthma -Patient presented emergency department complaining of wheezing shortness of breath that has been progressively getting even with nebulizer treatment at home -At presentation to ED patient is hemodynamically stable.  CBC and CMP grossly unremarkable.  Respiratory panel negative for COVID, RSV and flu.  Chest x-ray negative for pneumonia.  Normal ENP and troponin level. - In the ED patient has been treated with Solu-Medrol  125 mg, albuterol  analyzer 10 mg for 1 hour and mag sulfate 2 g. -Continue Xopenex  every 6 hours, Atrovent every 6 hours and albuterol  as needed, Solu-Medrol  IV 40 mg twice daily. -Continue supportive care.  GERD -Continue Protonix   Chronic pain syndrome Fibromyalgia -Continue gabapentin  and Robaxin   Hyperlipidemia -Continue Crestor   Essential hypertension -Continue amlodipine , lisinopril  and hydralazine as needed   History of left knee replacement Chronic back pain due to lumbar stenosis - Continue gabapentin  and Tylenol  as  needed  Acquired hypothyroidism -Continue levothyroxine   History of thyroid  cancer 1990 s/p thyroidectomy - In remission  History of breast cancer -History of breast cancer in 12/2013 status post left lumpectomy with chemotherapy, adjuvant chemotherapy and antiestrogen oral therapy  History of CVA 2019 - Continue Plavix .  Patient has history of intolerance to aspirin due to GI bleed  History of DVT of the right upper extremity 2017 -Patient being on Plavix  since 2017 for DVT of the right upper  extremity.  Prediabetic - Continue metformin  1000 mg twice daily  History of OSA on CPAP - Patient brought home CPAP machine to continue.   DVT prophylaxis:  Lovenox  Code Status:  Full Code Diet: Heart healthy and carb modified diet Family Communication:   Family was present at bedside, at the time of interview. Opportunity was given to ask question and all questions were answered satisfactorily.  Disposition Plan: Continue monitor improvement of wheezing/asthma exacerbation Consults: None at this time Admission status:   Observation, Telemetry bed  Severity of Illness: The appropriate patient status for this patient is OBSERVATION. Observation status is judged to be reasonable and necessary in order to provide the required intensity of service to ensure the patient's safety. The patient's presenting symptoms, physical exam findings, and initial radiographic and laboratory data in the context of their medical condition is felt to place them at decreased risk for further clinical deterioration. Furthermore, it is anticipated that the patient will be medically stable for discharge from the hospital within 2 midnights of admission.     Bernice Mullin, MD [facility]  How to contact the [facility] Attending or Consulting provider 7A - 7P or covering provider during after hours 7P -7A, for this patient.  Check the care team in [facility] and look for a) attending/consulting [facility] provider listed and b) the [facility] team listed Log into [email or web address] and use 's universal password to access. If you do not have the password, please contact the hospital operator. Locate the [facility] provider you are looking for under [facility] and page to a number that you can be directly reached. If you still have difficulty reaching the provider, please page the [facility] (Director on Call) for the Hospitalists listed on amion for assistance.  09/27/2023, 7:51 PM
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
## 2023-09-27 NOTE — ED Notes (Signed)
 EDP notified that pt's O2 has been bt 91-95%. EDP stated if pt drops below 90% to then use supplemental O2.

## 2023-09-27 NOTE — Plan of Care (Signed)
 Plan of Care Note for accepted transfer  Patient: Michelle Kennedy    WUJ:[id number]  DOA: 09/27/2023      Facility requesting transfer: [facility] Requesting Provider: Tama Fails, PA-C Reason for transfer: Acute wheezing  Facility course: 68yo female with history of asthma with dyspnea, coughing, diffuse audible wheezing unrelieved with solumedrol 125mg  x1, nebulizer treatment, magnesium  IV. Labs unremarkable, BNP 126, trop neg.  Of note, patient just returned from 30day cruise. Ordered D-dimer.   Admitting for acute asthma exacerbation.  Covid/RSV/flu neg, CXR neg for PNA.  BP 135/76   Pulse (!) 103   Resp (!) 23   Ht 5\' 6"  (1.676 m)   Wt 119.3 kg   SpO2 91%   BMI 42.45 kg/m    Plan of care: The patient is accepted for admission to Telemetry unit, OBS at [facility].    Author: Bertram Brocks, MD  09/27/2023  Check [email or web address] for on-call coverage.

## 2023-09-27 NOTE — ED Triage Notes (Signed)
 Pt POV slow gait, obvious increased work of breathing, audible wheezing. RT to triage to assess.    Pt reports recent 33 day cruise, reports cough x1.5 week.   Reports using nebulizing tx this am appx 0630, MDI use x3 last use 30 min PTA.

## 2023-09-27 NOTE — ED Provider Notes (Signed)
 [facility] Provider Note   CSN: [id number] Arrival date & time: 09/27/23  1208     History  Chief Complaint  Patient presents with   Shortness of Breath    Michelle Kennedy is a 68 y.o. female with a past med history of asthma who presents to the emergency department with chief complaint of acute asthma exacerbation.  Patient reports that she had a URI 3 weeks ago but believes that it had fully cleared itself and resolved.  She has been on a cruise for the past 33 days.  1 week ago she began having symptoms as asthma including wheezing coughing shortness of breath.  She has been using her nebulizer and rescue inhaler.  She also takes Advair  discus.  Symptoms have been progressively worsening.  Her husband states that today "the bottom just really fell out."  She is having severe wheezing and shortness of breath worse with exertion.  She denies fevers chills or per breath adductive cough.  She denies unilateral leg swelling or chest pain.  Shortness of Breath      Home Medications Prior to Admission medications   Medication Sig Start Date End Date Taking? Authorizing Provider  amLODipine  (NORVASC ) 5 MG tablet Take 1 tablet (5 mg total) by mouth daily. 08/01/23  Yes Dorrene Gaucher, NP  CALCIUM  PO Take 1 tablet by mouth every evening.   Yes [provider]  cholecalciferol  (VITAMIN D3) 25 MCG (1000 UNIT) tablet Take 1,000 Units by mouth every evening.    Yes [provider]  clopidogrel  (PLAVIX ) 75 MG tablet Take 1 tablet (75 mg total) by mouth daily. 07/11/23  Yes Dorrene Gaucher, NP  EPINEPHrine  0.3 mg/0.3 mL IJ SOAJ injection Inject 0.3 mg into the muscle as needed for anaphylaxis. 08/01/23  Yes Dorrene Gaucher, NP  furosemide  (LASIX ) 20 MG tablet TAKE 1 TABLET(20 MG) BY MOUTH DAILY AS NEEDED FOR SWELLING Patient taking differently: Take 20 mg by mouth daily as needed for edema or fluid. 08/01/23  Yes Dorrene Gaucher, NP  gabapentin  (NEURONTIN ) 300 MG capsule Take 1 capsule (300 mg total) by mouth 3 (three) times daily. Patient taking differently: Take 300 mg by mouth 2 (two) times daily. 06/11/23  Yes Dorrene Gaucher, NP  latanoprost  (XALATAN ) 0.005 % ophthalmic solution Place 1 drop into both eyes at bedtime. 10/24/20  Yes [provider]  levothyroxine  (SYNTHROID ) 150 MCG tablet Take 1 tablet (150 mcg total) by mouth daily. 06/20/23  Yes Emilie Harden, MD  lisinopril  (ZESTRIL ) 20 MG tablet Take 2 tablets (40 mg total) by mouth daily. 08/01/23  Yes O'Sullivan, Melissa, NP  meclizine  (ANTIVERT ) 25 MG tablet Take 1 tablet (25 mg total) by mouth 3 (three) times daily as needed for dizziness. 02/07/22  Yes O'Sullivan, Melissa, NP  metFORMIN  (GLUCOPHAGE ) 500 MG tablet Take 2 tablets (1,000 mg total) by mouth 2 (two) times daily with a meal. 07/19/23  Yes Kirk Peper A, DO  methocarbamol  (ROBAXIN ) 500 MG tablet Take 500 mg by mouth 4 (four) times daily as needed for muscle spasms.   Yes [provider]  montelukast  (SINGULAIR ) 10 MG tablet Take 1 tablet (10 mg total) by mouth at bedtime. 08/01/23  Yes O'Sullivan, Melissa, NP  Multiple Vitamin (MULTIVITAMIN ADULT) TABS Take 1 tablet by mouth daily.   Yes [provider]  Omega-3 Fatty Acids (FISH OIL) 1000 MG CAPS Take 1 capsule by mouth every evening.    Yes [provider]  pantoprazole  (  PROTONIX ) 40 MG tablet Take 1 tablet (40 mg total) by mouth daily. 07/30/23  Yes Dorrene Gaucher, NP  predniSONE  (DELTASONE ) 10 MG tablet Take 4 tablets (40 mg total) by mouth daily for 3 days, THEN 3 tablets (30 mg total) daily for 3 days, THEN 2 tablets (20 mg total) daily for 3 days, THEN 1 tablet (10 mg total) daily for 3 days. 09/29/23 10/11/23 Yes Lorita Rosa, MD  rosuvastatin  (CRESTOR ) 5 MG tablet Take 1 tablet (5 mg total) by mouth daily. 07/30/23  Yes Dorrene Gaucher, NP  topiramate  (TOPAMAX ) 50 MG tablet Take 1 tablet (50  mg total) by mouth daily with supper. 07/19/23  Yes Kirk Peper A, DO  albuterol  (PROVENTIL ) (2.5 MG/3ML) 0.083% nebulizer solution Take 3 mLs (2.5 mg total) by nebulization every 6 (six) hours as needed for wheezing or shortness of breath. 09/29/23   Lorita Rosa, MD  fluticasone -salmeterol (WIXELA INHUB) 500-50 MCG/ACT AEPB Inhale 1 puff into the lungs in the morning and at bedtime. 09/29/23   Lorita Rosa, MD  ipratropium-albuterol  (DUONEB) 0.5-2.5 (3) MG/3ML SOLN Take 3 mLs by nebulization every 6 (six) hours. 09/29/23   Lorita Rosa, MD      Allergies    Bee venom, Codeine, Contrast media [iodinated contrast media], Gadolinium derivatives, Latex, Lidocaine , Penicillins, Pentazocine lactate, Shellfish allergy, Aspirin, Erythromycin, Symbicort  [budesonide -formoterol  fumarate], Compazine  [prochlorperazine  maleate], and Sulfonamide derivatives    Review of Systems   Review of Systems  Respiratory:  Positive for shortness of breath.     Physical Exam Updated Vital Signs BP 138/82   Pulse 84   Temp 98.6 F (37 C) (Oral)   Resp 16   Ht 5\' 7"  (1.702 m)   Wt 119.8 kg   SpO2 95%   BMI 41.36 kg/m  Physical Exam Vitals and nursing note reviewed.  Constitutional:      General: She is not in acute distress.    Appearance: She is well-developed. She is not diaphoretic.  HENT:     Head: Normocephalic and atraumatic.     Right Ear: External ear normal.     Left Ear: External ear normal.     Nose: Nose normal.     Mouth/Throat:     Mouth: Mucous membranes are moist.  Eyes:     General: No scleral icterus.    Conjunctiva/sclera: Conjunctivae normal.  Cardiovascular:     Rate and Rhythm: Normal rate and regular rhythm.     Heart sounds: Normal heart sounds. No murmur heard.    No friction rub. No gallop.  Pulmonary:     Effort: Tachypnea, prolonged expiration and respiratory distress (mild) present.     Breath sounds: Decreased air movement present. Examination of the  right-upper field reveals wheezing. Examination of the left-upper field reveals wheezing. Examination of the right-middle field reveals wheezing. Examination of the left-middle field reveals wheezing. Examination of the right-lower field reveals wheezing. Examination of the left-lower field reveals wheezing. Wheezing present.  Abdominal:     General: Bowel sounds are normal. There is no distension.     Palpations: Abdomen is soft. There is no mass.     Tenderness: There is no abdominal tenderness. There is no guarding.  Musculoskeletal:     Cervical back: Normal range of motion.  Skin:    General: Skin is warm and dry.  Neurological:     Mental Status: She is alert and oriented to person, place, and time.  Psychiatric:        Behavior: Behavior normal.  ED Results / Procedures / Treatments   Labs (all labs ordered are listed, but only abnormal results are displayed) Labs Reviewed  BASIC METABOLIC PANEL WITH GFR - Abnormal; Notable for the following components:      Result Value   Glucose, Bld 106 (*)    All other components within normal limits  CBC - Abnormal; Notable for the following components:   RBC 5.58 (*)    MCV 77.4 (*)    MCH 24.0 (*)    RDW 15.9 (*)    All other components within normal limits  D-DIMER, QUANTITATIVE - Abnormal; Notable for the following components:   D-Dimer, Quant 0.70 (*)    All other components within normal limits  COMPREHENSIVE METABOLIC PANEL WITH GFR - Abnormal; Notable for the following components:   Glucose, Bld 140 (*)    Total Protein 6.2 (*)    Albumin  3.4 (*)    All other components within normal limits  CBC - Abnormal; Notable for the following components:   RBC 5.42 (*)    MCH 24.5 (*)    RDW 16.0 (*)    All other components within normal limits  GLUCOSE, CAPILLARY - Abnormal; Notable for the following components:   Glucose-Capillary 170 (*)    All other components within normal limits  GLUCOSE, CAPILLARY - Abnormal; Notable  for the following components:   Glucose-Capillary 128 (*)    All other components within normal limits  GLUCOSE, CAPILLARY - Abnormal; Notable for the following components:   Glucose-Capillary 103 (*)    All other components within normal limits  GLUCOSE, CAPILLARY - Abnormal; Notable for the following components:   Glucose-Capillary 118 (*)    All other components within normal limits  GLUCOSE, CAPILLARY - Abnormal; Notable for the following components:   Glucose-Capillary 129 (*)    All other components within normal limits  GLUCOSE, CAPILLARY - Abnormal; Notable for the following components:   Glucose-Capillary 117 (*)    All other components within normal limits  RESP PANEL BY RT-PCR (RSV, FLU A&B, COVID)  [id number]  PRO BRAIN NATRIURETIC PEPTIDE  TROPONIN T, HIGH SENSITIVITY    EKG EKG Interpretation Date/Time:  Thursday Sep 27 2023 12:25:10 EDT Ventricular Rate:  88 PR Interval:  177 QRS Duration:  91 QT Interval:  380 QTC Calculation: 460 R Axis:   50  Text Interpretation: Sinus rhythm Low voltage, precordial leads Borderline T abnormalities, anterior leads Baseline wander in lead(s) II Confirmed by Shyrl Doyne [phone]) on 09/28/2023 9:42:01 PM  Radiology No results found.  Procedures .Critical Care  Performed by: Tama Fails, PA-C Authorized by: Tama Fails, PA-C   Critical care provider statement:    Critical care time (minutes):  75   Critical care time was exclusive of:  Separately billable procedures and treating other patients   Critical care was necessary to treat or prevent imminent or life-threatening deterioration of the following conditions:  Respiratory failure (status asthmaticus)   Critical care was time spent personally by me on the following activities:  Development of treatment plan with patient or surrogate, discussions with consultants, evaluation of patient's response to treatment, examination of patient, ordering and review of laboratory  studies, ordering and review of radiographic studies, ordering and performing treatments and interventions, pulse oximetry, re-evaluation of patient's condition and review of old charts     Medications Ordered in ED Medications  0.9 %  sodium chloride  infusion (has no administration in time range)  methylPREDNISolone  sodium succinate (SOLU-MEDROL ) 125 mg/2 mL  injection 125 mg (125 mg Intravenous Given 09/27/23 1234)  ipratropium (ATROVENT ) nebulizer solution 1 mg (1 mg Nebulization Given 09/27/23 1258)  magnesium  sulfate IVPB 2 g 50 mL (0 g Intravenous Stopped 09/27/23 1405)  albuterol  (PROVENTIL ) (2.5 MG/3ML) 0.083% nebulizer solution 10 mg (10 mg Nebulization Given 09/27/23 1315)  albuterol  (PROVENTIL ) (2.5 MG/3ML) 0.083% nebulizer solution 10 mg (10 mg Nebulization Given 09/27/23 1515)    ED Course/ Medical Decision Making/ A&P Clinical Course as of 09/29/23 2019  Thu Sep 27, 2023  1350 Pro Brain natriuretic peptide [AH]  1350 Troponin T, High Sensitivity [AH]  1350 Resp panel by RT-PCR (RSV, Flu A&B, Covid) Anterior Nasal Swab [AH]  1350 CBC(!) [AH]  1505 Patient evaluated at bedside she continues to have prolonged expiratory phase, diffuse inspiratory and expiratory wheezing with poor air movement however is improved from my previous initial evaluation.  Think she will need at least 1 more hour-long neb.  If this fails she will need admission and she understands plan of care.  Patient has a history of previous hospitalizations for her asthma. [AH]    Clinical Course User Index [AH] Tama Fails, PA-C                                 Medical Decision Making Patient here with acute wheezing and sob. The emergent differential diagnosis for shortness of breath includes, but is not limited to, Pulmonary edema, bronchoconstriction, Pneumonia, Pulmonary embolism, Pneumotherax/ Hemothorax, Dysrythmia, ACS.    Patient clinical exam consistent with acute asthma exacerbation. Hx of the same.  2  hour long nebs with only moderate improvement 02 sats sitting b/n 91=93% at rest  Case discussed with Dr. Thelma Fire for admission  Labs/imaging reviewed.  I considered  other causes including PE, however patient's exam is not consistent with PE as the dx and therefore I did not order a d-dimer.    Amount and/or Complexity of Data Reviewed Labs: ordered. Decision-making details documented in ED Course. Radiology: ordered and independent interpretation performed.  Risk Prescription drug management. Decision regarding hospitalization.           Final Clinical Impression(s) / ED Diagnoses Final diagnoses:  Severe persistent asthma with status asthmaticus    Rx / DC Orders ED Discharge Orders          Ordered    predniSONE  (DELTASONE ) 10 MG tablet  Daily        09/29/23 1218    albuterol  (PROVENTIL ) (2.5 MG/3ML) 0.083% nebulizer solution  Every 6 hours PRN       Note to Pharmacy: Dx: J45.909   09/29/23 1218    fluticasone -salmeterol (WIXELA INHUB) 500-50 MCG/ACT AEPB  2 times daily        09/29/23 1218    ipratropium-albuterol  (DUONEB) 0.5-2.5 (3) MG/3ML SOLN  Every 6 hours        09/29/23 1218    Increase activity slowly        09/29/23 1218    Discharge instructions       Comments: 1. You have had an asthma exacerbation that is slowly improving 2. Continue nebulizer treatments with albuterol  every 2 hours as needed 3. Use Duonebs every 6 hours around the clock until exacerbation has resolved, and then stop 4. Take Wixhela inhaler twice daily every day even after exacerbation has resolved to prevent future exacerbations 5. Complete steroid taper 6. Follow up with NP Fleming Hum in 1-2 weeks  09/29/23 1218    Diet Carb Modified        09/29/23 1218    Call MD for:  difficulty breathing, headache or visual disturbances        09/29/23 1218    Call MD for:  persistant dizziness or light-headedness        09/29/23 1218              Tama Fails,  PA-C 09/29/23 2019    Lowery Rue, DO 10/02/23 2339

## 2023-09-27 NOTE — ED Notes (Signed)
 Called [facility] for transport to [facility].  Spoke with [employer].

## 2023-09-28 DIAGNOSIS — J4541 Moderate persistent asthma with (acute) exacerbation: Secondary | ICD-10-CM | POA: Diagnosis not present

## 2023-09-28 DIAGNOSIS — Z7902 Long term (current) use of antithrombotics/antiplatelets: Secondary | ICD-10-CM | POA: Diagnosis not present

## 2023-09-28 DIAGNOSIS — E66813 Obesity, class 3: Secondary | ICD-10-CM | POA: Diagnosis present

## 2023-09-28 DIAGNOSIS — E785 Hyperlipidemia, unspecified: Secondary | ICD-10-CM | POA: Diagnosis present

## 2023-09-28 DIAGNOSIS — M48061 Spinal stenosis, lumbar region without neurogenic claudication: Secondary | ICD-10-CM | POA: Diagnosis present

## 2023-09-28 DIAGNOSIS — M797 Fibromyalgia: Secondary | ICD-10-CM | POA: Diagnosis present

## 2023-09-28 DIAGNOSIS — J4552 Severe persistent asthma with status asthmaticus: Secondary | ICD-10-CM | POA: Diagnosis present

## 2023-09-28 DIAGNOSIS — G4733 Obstructive sleep apnea (adult) (pediatric): Secondary | ICD-10-CM | POA: Diagnosis present

## 2023-09-28 DIAGNOSIS — Z8249 Family history of ischemic heart disease and other diseases of the circulatory system: Secondary | ICD-10-CM | POA: Diagnosis not present

## 2023-09-28 DIAGNOSIS — Z79899 Other long term (current) drug therapy: Secondary | ICD-10-CM | POA: Diagnosis not present

## 2023-09-28 DIAGNOSIS — Z1152 Encounter for screening for COVID-19: Secondary | ICD-10-CM | POA: Diagnosis not present

## 2023-09-28 DIAGNOSIS — Z833 Family history of diabetes mellitus: Secondary | ICD-10-CM | POA: Diagnosis not present

## 2023-09-28 DIAGNOSIS — E890 Postprocedural hypothyroidism: Secondary | ICD-10-CM | POA: Diagnosis present

## 2023-09-28 DIAGNOSIS — G894 Chronic pain syndrome: Secondary | ICD-10-CM | POA: Diagnosis present

## 2023-09-28 DIAGNOSIS — J4489 Other specified chronic obstructive pulmonary disease: Secondary | ICD-10-CM | POA: Diagnosis present

## 2023-09-28 DIAGNOSIS — J4551 Severe persistent asthma with (acute) exacerbation: Secondary | ICD-10-CM | POA: Diagnosis present

## 2023-09-28 DIAGNOSIS — Z6841 Body Mass Index (BMI) 40.0 and over, adult: Secondary | ICD-10-CM | POA: Diagnosis not present

## 2023-09-28 DIAGNOSIS — K219 Gastro-esophageal reflux disease without esophagitis: Secondary | ICD-10-CM | POA: Diagnosis present

## 2023-09-28 DIAGNOSIS — Z79890 Hormone replacement therapy (postmenopausal): Secondary | ICD-10-CM | POA: Diagnosis not present

## 2023-09-28 DIAGNOSIS — Z7985 Long-term (current) use of injectable non-insulin antidiabetic drugs: Secondary | ICD-10-CM | POA: Diagnosis not present

## 2023-09-28 DIAGNOSIS — I509 Heart failure, unspecified: Secondary | ICD-10-CM | POA: Diagnosis present

## 2023-09-28 DIAGNOSIS — I110 Hypertensive heart disease with heart failure: Secondary | ICD-10-CM | POA: Diagnosis present

## 2023-09-28 DIAGNOSIS — Z7984 Long term (current) use of oral hypoglycemic drugs: Secondary | ICD-10-CM | POA: Diagnosis not present

## 2023-09-28 DIAGNOSIS — J45901 Unspecified asthma with (acute) exacerbation: Secondary | ICD-10-CM | POA: Diagnosis present

## 2023-09-28 DIAGNOSIS — R7303 Prediabetes: Secondary | ICD-10-CM | POA: Diagnosis present

## 2023-09-28 DIAGNOSIS — M069 Rheumatoid arthritis, unspecified: Secondary | ICD-10-CM | POA: Diagnosis present

## 2023-09-28 LAB — COMPREHENSIVE METABOLIC PANEL WITH GFR
ALT: 24 U/L (ref 0–44)
AST: 19 U/L (ref 15–41)
Albumin: 3.4 g/dL — ABNORMAL LOW (ref 3.5–5.0)
Alkaline Phosphatase: 58 U/L (ref 38–126)
Anion gap: 10 (ref 5–15)
BUN: 13 mg/dL (ref 8–23)
CO2: 23 mmol/L (ref 22–32)
Calcium: 9 mg/dL (ref 8.9–10.3)
Chloride: 108 mmol/L (ref 98–111)
Creatinine, Ser: 0.62 mg/dL (ref 0.44–1.00)
GFR, Estimated: >60 mL/min (ref >60)
Glucose, Bld: 140 mg/dL — ABNORMAL HIGH (ref 70–99)
Potassium: 4.2 mmol/L (ref 3.5–5.1)
Sodium: 141 mmol/L (ref 135–145)
Total Bilirubin: 0.4 mg/dL (ref 0.0–1.2)
Total Protein: 6.2 g/dL — ABNORMAL LOW (ref 6.5–8.1)

## 2023-09-28 LAB — GLUCOSE, CAPILLARY
Glucose-Capillary: 128 mg/dL — ABNORMAL HIGH (ref 70–99)
Glucose-Capillary: 103 mg/dL — ABNORMAL HIGH (ref 70–99)
Glucose-Capillary: 118 mg/dL — ABNORMAL HIGH (ref 70–99)
Glucose-Capillary: 129 mg/dL — ABNORMAL HIGH (ref 70–99)

## 2023-09-28 LAB — CBC
HCT: 43.9 % (ref 36.0–46.0)
Hemoglobin: 13.3 g/dL (ref 12.0–15.0)
MCH: 24.5 pg — ABNORMAL LOW (ref 26.0–34.0)
MCHC: 30.3 g/dL (ref 30.0–36.0)
MCV: 81 fL (ref 80.0–100.0)
Platelets: 227 10*3/uL (ref 150–400)
RBC: 5.42 MIL/uL — ABNORMAL HIGH (ref 3.87–5.11)
RDW: 16 % — ABNORMAL HIGH (ref 11.5–15.5)
WBC: 7.4 10*3/uL (ref 4.0–10.5)
nRBC: 0 % (ref 0.0–0.2)

## 2023-09-28 MED ORDER — TOPIRAMATE 25 MG PO TABS
50.0000 mg | ORAL_TABLET | Freq: Every day | ORAL | Status: DC
  Administered 2023-09-28: 50 mg via ORAL
  Filled 2023-09-28: qty 2

## 2023-09-28 MED ORDER — IPRATROPIUM-ALBUTEROL 0.5-2.5 (3) MG/3ML IN SOLN
3.0000 mL | Freq: Four times a day (QID) | RESPIRATORY_TRACT | Status: DC
  Administered 2023-09-28 – 2023-09-29 (×3): 3 mL via RESPIRATORY_TRACT
  Filled 2023-09-28 (×3): qty 3

## 2023-09-28 MED ORDER — ALBUTEROL SULFATE (2.5 MG/3ML) 0.083% IN NEBU: 2.5000 mg | INHALATION_SOLUTION | RESPIRATORY_TRACT | Status: DC | PRN

## 2023-09-28 MED ORDER — INSULIN ASPART 100 UNIT/ML IJ SOLN: 0.0000 [IU] | Freq: Three times a day (TID) | INTRAMUSCULAR | Status: DC

## 2023-09-28 NOTE — Progress Notes (Signed)
   09/28/23 0943  TOC Brief Assessment  Insurance and Status Reviewed  Patient has primary care physician Yes  Home environment has been reviewed Resides in single family home with spouse  Prior level of function: Independent with ADLs at baseline  Prior/Current Home Services No current home services  Social Drivers of Health Review SDOH reviewed no interventions necessary  Readmission risk has been reviewed Yes  Transition of care needs no transition of care needs at this time
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
## 2023-09-28 NOTE — Plan of Care (Signed)
  Problem: Clinical Measurements: Goal: Will remain free from infection Outcome: Progressing   Problem: Clinical Measurements: Goal: Diagnostic test results will improve Outcome: Progressing   Problem: Clinical Measurements: Goal: Respiratory complications will improve Outcome: Progressing   Problem: Clinical Measurements: Goal: Cardiovascular complication will be avoided Outcome: Progressing   Problem: Activity: Goal: Risk for activity intolerance will decrease Outcome: Progressing   Problem: Clinical Measurements: Goal: Cardiovascular complication will be avoided Outcome: Progressing

## 2023-09-28 NOTE — Hospital Course (Signed)
 15XW with h/o asthma, HTN, breast CA, CVA, chronic pain, thyroid /breast cancer, and morbid obesity who presented on 5/8 with SOB.  Diagnosed with asthma exacerbation, treated with nebs, steroids, magnesium .  Currently on room air.

## 2023-09-29 DIAGNOSIS — J4551 Severe persistent asthma with (acute) exacerbation: Secondary | ICD-10-CM | POA: Diagnosis not present

## 2023-09-29 LAB — GLUCOSE, CAPILLARY: Glucose-Capillary: 117 mg/dL — ABNORMAL HIGH (ref 70–99)

## 2023-09-29 MED ORDER — ALBUTEROL SULFATE (2.5 MG/3ML) 0.083% IN NEBU: 2.5000 mg | INHALATION_SOLUTION | Freq: Four times a day (QID) | RESPIRATORY_TRACT | 5 refills | Status: AC | PRN

## 2023-09-29 MED ORDER — FLUTICASONE-SALMETEROL 500-50 MCG/ACT IN AEPB: 1.0000 | INHALATION_SPRAY | Freq: Two times a day (BID) | RESPIRATORY_TRACT | 2 refills | Status: DC

## 2023-09-29 MED ORDER — PREDNISONE 10 MG PO TABS: ORAL_TABLET | ORAL | 0 refills | Status: DC

## 2023-09-29 MED ORDER — IPRATROPIUM-ALBUTEROL 0.5-2.5 (3) MG/3ML IN SOLN: 3.0000 mL | Freq: Four times a day (QID) | RESPIRATORY_TRACT | 0 refills | Status: DC

## 2023-09-29 NOTE — Progress Notes (Signed)
 Patient to be discharged to home today. All discharge instructions including all discharge Medications and schedules for these Medications reviewed with the Patient and patient's Husband/ Understanding verbalized and discharge AVS with the Patient at time of discharge

## 2023-09-29 NOTE — Discharge Summary (Signed)
 Physician Discharge Summary   Patient: Michelle Kennedy MRN: [id number] DOB: [birth year]  Admit date:     09/27/2023  Discharge date: 09/29/23  Discharge Physician: Lorita Rosa   PCP: Dorrene Gaucher, NP   Recommendations at discharge:   You have had an asthma exacerbation that is slowly improving Continue nebulizer treatments with albuterol  every 2 hours as needed Use Duonebs every 6 hours around the clock until exacerbation has resolved, and then stop Take Wixhela inhaler twice daily every day even after exacerbation has resolved to prevent future exacerbations Complete steroid taper Follow up with NP Fleming Hum in 1-2 weeks  Discharge Diagnoses: Principal Problem:   Acute asthma exacerbation Active Problems:   GERD   Fibromyalgia   Hyperlipidemia   Breast cancer of upper-outer quadrant of left female breast (HCC)   OSA on CPAP   Essential hypertension   Morbid obesity (HCC)   History of total left knee replacement   Chronic pain syndrome   Hypothyroidism   History of CVA (cerebrovascular accident) 2017   History of DVT (deep vein thrombosis) of right upper extremity 2015   Asthma exacerbation    Hospital Course: [phone] with h/o asthma, HTN, breast CA, CVA, chronic pain, thyroid /breast cancer, and morbid obesity who presented on 5/8 with SOB.  Diagnosed with asthma exacerbation, treated with nebs, steroids, magnesium .  Currently on room air.  Assessment and Plan:  Acute exacerbation of asthma  Wheezing and shortness of breath that has been progressively getting worse despite nebulizer treatment at home Symptoms started after exposure to fumigating agents on a cruise ship Hemodynamically stable, no hypoxia Respiratory panel negative for COVID, RSV and flu Chest x-ray negative for pneumonia Treated with Solu-Medrol , continuous neb, and mag sulfate 2 g while in the ER Continued to feel SOB with exertion and expiratory wheezes despite therapy but she is slowly  improving and feels ready for dc today Change to Duonebs q6, albuterol  q2h prn Continue steroids   GERD Continue Protonix    Chronic pain syndrome/Fibromyalgia Continue gabapentin  and Robaxin    Hyperlipidemia Continue Crestor    Essential hypertension Continue amlodipine , lisinopril     Chronic back pain due to lumbar stenosis Continue gabapentin  and Tylenol  as needed   Acquired hypothyroidism Continue levothyroxine    History of thyroid  cancer s/p remote thyroidectomy   History of breast cancer S/p left lumpectomy with chemotherapy, adjuvant chemotherapy and antiestrogen oral therapy 2015   History of CVA Continue Plavix  H/o intolerance to aspirin due to GI bleed   Prediabetic A1c 5.6 s/p significant weight loss on Ozempic  Resume Glucophage  Carb modified diet    History of OSA on CPAP Patient brought home CPAP machine for at bedtime use   Class 3 obesity Body mass index is 41.36 kg/m Weight loss should be encouraged on an ongoing basis She previously used Ozempic  and has lost 65 pounds but her insurance is no longer paying for it Outpatient PCP/bariatric medicine f/u encouraged Significantly low or high BMI is associated with higher medical risk including morbidity and mortality          Consultants: None   Procedures: None   Antibiotics: None      Pain control - [place]  Controlled Substance Reporting System database was reviewed. and patient was instructed, not to drive, operate heavy machinery, perform activities at heights, swimming or participation in water  activities or provide baby-sitting services while on Pain, Sleep and Anxiety Medications; until their outpatient Physician has advised to do so again. Also recommended to not to take more than  prescribed Pain, Sleep and Anxiety Medications.   Disposition: Home Diet recommendation:  Carb modified diet DISCHARGE MEDICATION: Allergies as of 09/29/2023       Reactions   Bee Venom  Anaphylaxis   Codeine Shortness Of Breath, Rash   Contrast Media [iodinated Contrast Media] Shortness Of Breath   Gadolinium Derivatives Hives, Rash, Shortness Of Breath, Swelling   "my heart stopped beating"   Latex Anaphylaxis, Rash   Lidocaine  Shortness Of Breath, Swelling   SWELLING OF MOUTH AND THROAT, low BP   Penicillins Shortness Of Breath, Rash, Other (See Comments)   SEIZURE   Pentazocine Lactate Shortness Of Breath, Rash   Shellfish Allergy Shortness Of Breath, Rash   Aspirin Rash, Other (See Comments)   GI UPSET   Erythromycin Swelling, Rash   SWELLING OF JOINTS   Symbicort  [budesonide -formoterol  Fumarate] Other (See Comments)   BURNING OF TONGUE AND LIPS   Compazine  [prochlorperazine  Maleate] Rash   Rash on face,chest, arms, back   Sulfonamide Derivatives Rash        Medication List     STOP taking these medications    terbinafine  250 MG tablet Commonly known as: LamISIL        TAKE these medications    albuterol  (2.5 MG/3ML) 0.083% nebulizer solution Commonly known as: PROVENTIL  Take 3 mLs (2.5 mg total) by nebulization every 6 (six) hours as needed for wheezing or shortness of breath.   amLODipine  5 MG tablet Commonly known as: NORVASC  Take 1 tablet (5 mg total) by mouth daily.   CALCIUM  PO Take 1 tablet by mouth every evening.   cholecalciferol  25 MCG (1000 UNIT) tablet Commonly known as: VITAMIN D3 Take 1,000 Units by mouth every evening.   clopidogrel  75 MG tablet Commonly known as: PLAVIX  Take 1 tablet (75 mg total) by mouth daily.   EPINEPHrine  0.3 mg/0.3 mL Soaj injection Commonly known as: EPI-PEN Inject 0.3 mg into the muscle as needed for anaphylaxis.   Fish Oil 1000 MG Caps Take 1 capsule by mouth every evening.   fluticasone -salmeterol 500-50 MCG/ACT Aepb Commonly known as: Wixela Inhub Inhale 1 puff into the lungs in the morning and at bedtime. What changed:  when to take this reasons to take this   furosemide  20 MG  tablet Commonly known as: LASIX  TAKE 1 TABLET(20 MG) BY MOUTH DAILY AS NEEDED FOR SWELLING What changed:  how much to take how to take this when to take this reasons to take this additional instructions   gabapentin  300 MG capsule Commonly known as: NEURONTIN  Take 1 capsule (300 mg total) by mouth 3 (three) times daily. What changed: when to take this   ipratropium-albuterol  0.5-2.5 (3) MG/3ML Soln Commonly known as: DUONEB Take 3 mLs by nebulization every 6 (six) hours.   latanoprost  0.005 % ophthalmic solution Commonly known as: XALATAN  Place 1 drop into both eyes at bedtime.   levothyroxine  150 MCG tablet Commonly known as: SYNTHROID  Take 1 tablet (150 mcg total) by mouth daily.   lisinopril  20 MG tablet Commonly known as: ZESTRIL  Take 2 tablets (40 mg total) by mouth daily.   meclizine  25 MG tablet Commonly known as: ANTIVERT  Take 1 tablet (25 mg total) by mouth 3 (three) times daily as needed for dizziness.   metFORMIN  500 MG tablet Commonly known as: GLUCOPHAGE  Take 2 tablets (1,000 mg total) by mouth 2 (two) times daily with a meal.   methocarbamol  500 MG tablet Commonly known as: ROBAXIN  Take 500 mg by mouth 4 (four) times daily as  needed for muscle spasms.   montelukast  10 MG tablet Commonly known as: SINGULAIR  Take 1 tablet (10 mg total) by mouth at bedtime.   Multivitamin Adult Tabs Take 1 tablet by mouth daily.   pantoprazole  40 MG tablet Commonly known as: PROTONIX  Take 1 tablet (40 mg total) by mouth daily.   predniSONE  10 MG tablet Commonly known as: DELTASONE  Take 4 tablets (40 mg total) by mouth daily for 3 days, THEN 3 tablets (30 mg total) daily for 3 days, THEN 2 tablets (20 mg total) daily for 3 days, THEN 1 tablet (10 mg total) daily for 3 days. Start taking on: Sep 29, 2023   rosuvastatin  5 MG tablet Commonly known as: CRESTOR  Take 1 tablet (5 mg total) by mouth daily.   topiramate  50 MG tablet Commonly known as: Topamax  Take 1  tablet (50 mg total) by mouth daily with supper.        Discharge Exam:    Subjective: Feeling better.  Still wheezing but less SOB.  Feels ready to go home today.   Objective: Vitals:   09/29/23 0627 09/29/23 0907  BP:  138/82  Pulse:    Resp:    Temp:    SpO2: 95%     Intake/Output Summary (Last 24 hours) at 09/29/2023 1218 Last data filed at 09/29/2023 0800 Gross per 24 hour  Intake 480 ml  Output --  Net 480 ml   Filed Weights   09/27/23 1217 09/27/23 1845  Weight: 119.3 kg 119.8 kg    Exam:  General:  Appears calm and comfortable and is in NAD, on RA Eyes:  EOMI, normal lids, iris ENT:  grossly normal hearing, lips & tongue, mmm Cardiovascular:  RRR. No LE edema.  Respiratory:  Persistent but improved expiratory wheezing with improved air movement.  Normal respiratory effort. Abdomen:  soft, NT, ND Skin:  no rash or induration seen on limited exam Musculoskeletal:  grossly normal tone BUE/BLE, good ROM, no bony abnormality Psychiatric:  grossly normal mood and affect, speech fluent and appropriate, AOx3 Neurologic:  CN 2-12 grossly intact, moves all extremities in coordinated fashion  Data Reviewed: I have reviewed the patient's lab results since admission.  Pertinent labs for today include:   None today    Condition at discharge: improving  The results of significant diagnostics from this hospitalization (including imaging, microbiology, ancillary and laboratory) are listed below for reference.   Imaging Studies: DG Chest 2 View Result Date: 09/27/2023 CLINICAL DATA:  Shortness of breath. Slow gait with audible wheezing. EXAM: CHEST - 2 VIEW COMPARISON:  Radiographs 07/04/2023 and 06/05/2023.  CT 07/07/2020. FINDINGS: The heart size and mediastinal contours are stable. There are postsurgical changes within the left breast and left axilla. The lungs appear clear. There is no pleural effusion or pneumothorax. No acute osseous findings are evident. Mild  degenerative changes in the spine. IMPRESSION: No evidence of acute cardiopulmonary process. Postsurgical changes in the left breast and left axilla. Electronically Signed   By: Elmon Hagedorn M.D.   On: 09/27/2023 13:37    Microbiology: Results for orders placed or performed during the hospital encounter of 09/27/23  Resp panel by RT-PCR (RSV, Flu A&B, Covid) Anterior Nasal Swab     Status: None   Collection Time: 09/27/23 12:21 PM   Specimen: Anterior Nasal Swab  Result Value Ref Range Status   SARS Coronavirus 2 by RT PCR NEGATIVE NEGATIVE Final    Comment: (NOTE) SARS-CoV-2 target nucleic acids are NOT DETECTED.  The  SARS-CoV-2 RNA is generally detectable in upper respiratory specimens during the acute phase of infection. The lowest concentration of SARS-CoV-2 viral copies this assay can detect is 138 copies/mL. A negative result does not preclude SARS-Cov-2 infection and should not be used as the sole basis for treatment or other patient management decisions. A negative result may occur with  improper specimen collection/handling, submission of specimen other than nasopharyngeal swab, presence of viral mutation(s) within the areas targeted by this assay, and inadequate number of viral copies(<138 copies/mL). A negative result must be combined with clinical observations, patient history, and epidemiological information. The expected result is Negative.  Fact Sheet for Patients:  BloggerCourse.com  Fact Sheet for Healthcare Providers:  SeriousBroker.it  This test is no t yet approved or cleared by the [place]  FDA and  has been authorized for detection and/or diagnosis of SARS-CoV-2 by FDA under an Emergency Use Authorization (EUA). This EUA will remain  in effect (meaning this test can be used) for the duration of the COVID-19 declaration under Section 564(b)(1) of the Act, 21 U.S.C.section 360bbb-3(b)(1), unless the  authorization is terminated  or revoked sooner.       Influenza A by PCR NEGATIVE NEGATIVE Final   Influenza B by PCR NEGATIVE NEGATIVE Final    Comment: (NOTE) The Xpert Xpress SARS-CoV-2/FLU/RSV plus assay is intended as an aid in the diagnosis of influenza from Nasopharyngeal swab specimens and should not be used as a sole basis for treatment. Nasal washings and aspirates are unacceptable for Xpert Xpress SARS-CoV-2/FLU/RSV testing.  Fact Sheet for Patients: BloggerCourse.com  Fact Sheet for Healthcare Providers: SeriousBroker.it  This test is not yet approved or cleared by the [place]  FDA and has been authorized for detection and/or diagnosis of SARS-CoV-2 by FDA under an Emergency Use Authorization (EUA). This EUA will remain in effect (meaning this test can be used) for the duration of the COVID-19 declaration under Section 564(b)(1) of the Act, 21 U.S.C. section 360bbb-3(b)(1), unless the authorization is terminated or revoked.     Resp Syncytial Virus by PCR NEGATIVE NEGATIVE Final    Comment: (NOTE) Fact Sheet for Patients: BloggerCourse.com  Fact Sheet for Healthcare Providers: SeriousBroker.it  This test is not yet approved or cleared by the [place]  FDA and has been authorized for detection and/or diagnosis of SARS-CoV-2 by FDA under an Emergency Use Authorization (EUA). This EUA will remain in effect (meaning this test can be used) for the duration of the COVID-19 declaration under Section 564(b)(1) of the Act, 21 U.S.C. section 360bbb-3(b)(1), unless the authorization is terminated or revoked.  Performed at [facility], [address] [place], [place] [address]    *Note: Due to a large number of results and/or encounters for the requested time period, some results have not been displayed. A complete set of results can be found in  Results Review.    Labs: CBC: Recent Labs  Lab 09/27/23 1221 09/28/23 0520  WBC 4.4 7.4  HGB 13.4 13.3  HCT 43.2 43.9  MCV 77.4* 81.0  PLT 230 227   Basic Metabolic Panel: Recent Labs  Lab 09/27/23 1221 09/28/23 0520  NA 141 141  K 4.0 4.2  CL 104 108  CO2 24 23  GLUCOSE 106* 140*  BUN 11 13  CREATININE 0.79 0.62  CALCIUM  9.4 9.0   Liver Function Tests: Recent Labs  Lab 09/28/23 0520  AST 19  ALT 24  ALKPHOS 58  BILITOT 0.4  PROT 6.2*  ALBUMIN  3.4*  CBG: Recent Labs  Lab 09/28/23 0735 09/28/23 1138 09/28/23 1626 09/28/23 2018 09/29/23 0728  GLUCAP 128* 103* 118* 129* 117*    Discharge time spent: greater than 30 minutes.  Signed: Lorita Rosa, MD [facility] 09/29/2023

## 2023-10-01 ENCOUNTER — Ambulatory Visit: Payer: Self-pay

## 2023-10-01 ENCOUNTER — Telehealth: Payer: Self-pay | Admitting: *Deleted

## 2023-10-01 NOTE — Telephone Encounter (Signed)
 Scheduled

## 2023-10-01 NOTE — Telephone Encounter (Signed)
 Copied from CRM [id number]. Topic: Clinical - Red Word Triage >> Oct 01, 2023  8:57 AM Crispin Dolphin wrote: Red Word that prompted transfer to Nurse Triage: trouble breathing - was seen at ED - pharmacy did not have Rx.   Chief Complaint: Dyspnea Symptoms: Coughing Frequency: 2 Days  Pertinent Negatives: Patient denies fever  Disposition: [x] ED /[] Urgent Care (no appt availability in office) / [] Appointment(In office/virtual)/ []  [place] [facility]/ [] Home Care/ [] Refused Recommended Disposition /[] [place] [facility]/ []  Follow-up with PCP Additional Notes: ML is being triaged for post asthma exacerbation treatment. Recently discharged from the Hospital. Patient noted slightly bluish tinged fingernails on left hand, some on right. Recommended ED for evaluation and treatment, agreed to disposition.   Reason for Disposition . Oxygen level (e.g., pulse oximetry) 90 percent or lower    Averaged 91-94 in hospital. Bluish Tint to fingernails.  Answer Assessment - Initial Assessment Questions 1. RESPIRATORY STATUS: "Describe your breathing?" (e.g., wheezing, shortness of breath, unable to speak, severe coughing)      Coughing, Dyspnea  2. ONSET: "When did this breathing problem begin?"      Since Discharge  3. PATTERN "Does the difficult breathing come and go, or has it been constant since it started?"      Constant  4. SEVERITY: "How bad is your breathing?" (e.g., mild, moderate, severe)    - MILD: No SOB at rest, mild SOB with walking, speaks normally in sentences, can lie down, no retractions, pulse < 100.    - MODERATE: SOB at rest, SOB with minimal exertion and prefers to sit, cannot lie down flat, speaks in phrases, mild retractions, audible wheezing, pulse 100-120.    - SEVERE: Very SOB at rest, speaks in single words, struggling to breathe, sitting hunched forward, retractions, pulse > 120      Moderate  5. RECURRENT SYMPTOM: "Have you had difficulty breathing before?" If Yes,  ask: "When was the last time?" and "What happened that time?"      Yes, Exacerbation  6. CARDIAC HISTORY: "Do you have any history of heart disease?" (e.g., heart attack, angina, bypass surgery, angioplasty)      No 7. LUNG HISTORY: "Do you have any history of lung disease?"  (e.g., pulmonary embolus, asthma, emphysema)     Embolus, Asthma  8. CAUSE: "What do you think is causing the breathing problem?"      Lack of medication  9. OTHER SYMPTOMS: "Do you have any other symptoms? (e.g., dizziness, runny nose, cough, chest pain, fever)     Cough  10. O2 SATURATION MONITOR:  "Do you use an oxygen saturation monitor (pulse oximeter) at home?" If Yes, ask: "What is your reading (oxygen level) today?" "What is your usual oxygen saturation reading?" (e.g., 95%)        Unsure  11. PREGNANCY: "Is there any chance you are pregnant?" "When was your last menstrual period?"       No and No  12. TRAVEL: "Have you traveled out of the country in the last month?" (e.g., travel history, exposures)       Unsure, Went on a cruise, and had a hospital stay  Protocols used: Breathing Difficulty-A-AH

## 2023-10-02 ENCOUNTER — Telehealth: Payer: Self-pay | Admitting: *Deleted

## 2023-10-02 ENCOUNTER — Other Ambulatory Visit: Payer: Self-pay | Admitting: Family

## 2023-10-02 DIAGNOSIS — J45901 Unspecified asthma with (acute) exacerbation: Secondary | ICD-10-CM

## 2023-10-02 MED ORDER — FLUTICASONE-SALMETEROL 500-50 MCG/ACT IN AEPB
1.0000 | INHALATION_SPRAY | Freq: Two times a day (BID) | RESPIRATORY_TRACT | 2 refills | Status: AC
Start: 2023-10-02 — End: ?

## 2023-10-02 MED ORDER — IPRATROPIUM-ALBUTEROL 0.5-2.5 (3) MG/3ML IN SOLN: 3.0000 mL | Freq: Four times a day (QID) | RESPIRATORY_TRACT | 0 refills | Status: AC

## 2023-10-02 NOTE — Transitions of Care (Post Inpatient/ED Visit) (Signed)
   10/02/2023  Name: Michelle Kennedy MRN: [id number] DOB: 1955/06/26  Today's TOC FU Call Status: Today's TOC FU Call Status:: Successful TOC FU Call Completed TOC FU Call Complete Date: 10/01/23 Patient's Name and Date of Birth confirmed.  Transition Care Management Follow-up Telephone Call Date of Discharge: 09/29/23 Discharge Facility: [facility]) Type of Discharge: Inpatient Admission Primary Inpatient Discharge Diagnosis:: acute asthma exacerbation Any questions or concerns?: Yes Patient Questions/Concerns:: Patient was told to take pick up two inhalers and the codes was not sent in. The pharmacy would not fill them. ke Wixhela inhaler twice daily every day even after exacerbation has resolved to prevent future exacerbations. . Use Duonebs every 6 hours around the clock until exacerbation has resolved Patient Questions/Concerns Addressed: Notified Provider of Patient Questions/Concerns  Items Reviewed: Did you receive and understand the discharge instructions provided?: Yes Medications obtained,verified, and reconciled?: No Medications Not Reviewed Reasons:: Patient Needs Assistance Obtaining Medications (PCP contacted) Any new allergies since your discharge?: No Dietary orders reviewed?: No Do you have support at home?: Yes People in Home [RPT]: spouse Name of Support/Comfort Primary Source: Ike Malady  Medications Reviewed Today: Medications Reviewed Today   Medications were not reviewed in this encounter     Home Care and Equipment/Supplies: Were Home Health Services Ordered?: NA Any new equipment or medical supplies ordered?: NA  Functional Questionnaire: Do you need assistance with bathing/showering or dressing?: No Do you need assistance with meal preparation?: No Do you need assistance with eating?: No Do you have difficulty maintaining continence: No Do you need assistance with getting out of bed/getting out of a chair/moving?: No Do you have difficulty  managing or taking your medications?: No  Follow up appointments reviewed: PCP Follow-up appointment confirmed?: Yes Date of PCP follow-up appointment?: 10/10/23 Follow-up Provider: [facility] Follow-up appointment confirmed?: NA Do you need transportation to your follow-up appointment?: No Do you understand care options if your condition(s) worsen?: Yes-patient verbalized understanding  SDOH Interventions Today    Flowsheet Row Most Recent Value  SDOH Interventions   Food Insecurity Interventions Intervention Not Indicated  Housing Interventions Intervention Not Indicated  Transportation Interventions Intervention Not Indicated, Patient Resources (Friends/Family)  Utilities Interventions Intervention Not Indicated      Prescription for Wixela and Duo nebs not sent in by Hospitalist. Dorrene Gaucher notified and sent prescriptions and dx code in. Care guide scheduled follow up appointment with PCP Patient was called and made aware prescriptions are at pharmacy and her follow up appointment.   Una Ganser BSN RN [facility] Health Care Management Coordinator [email or web address] Direct Dial: [phone]  Fax: [phone] Website: [email or web address]

## 2023-10-02 NOTE — Progress Notes (Unsigned)
 Complex Care Management Care Guide Note  10/02/2023 Name: Michelle Kennedy MRN: [id number] DOB: 1956/01/25  Michelle Kennedy is a 68 y.o. year old female who is a primary care patient of Dorrene Gaucher, NP and is actively engaged with the care management team. I reached out to Alida Ion by phone today to assist with scheduling   Hospital follow up .  Follow up plan: Unsuccessful telephone outreach attempt made. A HIPAA compliant phone message was left for the patient providing contact information and requesting a return call.  Barnie Bora  [facility]  [facility], [facility] Guide  Direct Dial: [phone]  Fax [phone]

## 2023-10-03 NOTE — Progress Notes (Signed)
 Complex Care Management Care Guide Note  10/03/2023 Name: [name] MRN: [id number] DOB: December 05, 1955  Michelle Kennedy is a 68 y.o. year old female who is a primary care patient of O'Sullivan, Melissa, NP and is actively engaged with the care management team. I reached out to Alida Ion by phone today to assist with scheduling  with the Dorrene Gaucher, NP.  Follow up plan:Hospital follow up scheduled for 10/10/23 at 2:20 PM  Barnie Bora  [facility], [facility] Guide  Direct Dial: [phone]  Fax [phone]

## 2023-10-04 ENCOUNTER — Encounter: Payer: Self-pay | Admitting: Bariatrics

## 2023-10-04 ENCOUNTER — Ambulatory Visit: Admitting: Bariatrics

## 2023-10-04 VITALS — BP 112/71 | HR 78 | Temp 97.5°F | Ht 66.0 in | Wt 250.0 lb

## 2023-10-04 DIAGNOSIS — F5089 Other specified eating disorder: Secondary | ICD-10-CM | POA: Diagnosis not present

## 2023-10-04 DIAGNOSIS — R7303 Prediabetes: Secondary | ICD-10-CM | POA: Diagnosis not present

## 2023-10-04 DIAGNOSIS — Z6841 Body Mass Index (BMI) 40.0 and over, adult: Secondary | ICD-10-CM

## 2023-10-04 MED ORDER — TOPIRAMATE 100 MG PO TABS: 100.0000 mg | ORAL_TABLET | Freq: Every day | ORAL | 0 refills | Status: DC

## 2023-10-04 MED ORDER — METFORMIN HCL 500 MG PO TABS
ORAL_TABLET | ORAL | 0 refills | Status: DC
Start: 2023-10-04 — End: 2023-11-12

## 2023-10-04 NOTE — Progress Notes (Signed)
 WEIGHT SUMMARY AND BIOMETRICS  Weight Lost Since Last Visit: 0  Weight Gained Since Last Visit: 3lb   Vitals Temp: (!) 97.5 F (36.4 C) BP: 112/71 Pulse Rate: 78 SpO2: 96 %   Anthropometric Measurements Height: 5\' 6"  (1.676 m) Weight: 250 lb (113.4 kg) BMI (Calculated): 40.37 Weight at Last Visit: 247lb Weight Lost Since Last Visit: 0 Weight Gained Since Last Visit: 3lb Starting Weight: 275lb Total Weight Loss (lbs): 25 lb (11.3 kg)   Body Composition  Body Fat %: 49.2 % Fat Mass (lbs): 123.2 lbs Muscle Mass (lbs): 121 lbs Total Body Water  (lbs): 88.2 lbs Visceral Fat Rating : 17   Other Clinical Data Fasting: no Labs: no Today's Visit #: 17 Starting Date: 07/28/21    OBESITY Michelle Kennedy is here to discuss her progress with her obesity treatment plan along with follow-up of her obesity related diagnoses.    Nutrition Plan: the Category 4 plan - 0% adherence.  Current exercise: walking  Interim History:  Eating all of the food on the plan., Protein intake is as prescribed, Is not skipping meals, and Water  intake is adequate.   Pharmacotherapy: Michelle Kennedy is on Metformin  500 mg twice daily with meals Adverse side effects: None Hunger is moderately controlled.  Cravings are moderately controlled.  Assessment/Plan:   Prediabetes Last A1c was 5.6  Medication(s): Metformin  500 mg twice daily with meals Lab Results  Component Value Date   HGBA1C 5.6 06/19/2023   HGBA1C 5.9 02/21/2023   HGBA1C 5.7 (A) 10/03/2022   HGBA1C 5.7 (H) 07/06/2022   HGBA1C 5.7 (A) 02/02/2022   Lab Results  Component Value Date   INSULIN  18.0 07/06/2022   INSULIN  25.6 (H) 07/28/2021   INSULIN  21.8 09/20/2016    Plan: Will minimize all refined carbohydrates both sweets and starches.  Will work on the plan and exercise.  Consider both aerobic and resistance  training.  Will keep protein, water , and fiber intake high.  Increase Polyunsaturated and Monounsaturated fats to increase satiety and encourage weight loss.  Aim for 7 to 9 hours of sleep nightly.  Will continue medications.  Continue and increase dose Rx: Metformin  500 mg 2 tablets in the a.m. and 2 tablets in the p.m. with meals, 120 tablets with no refills.  Eating disorder/emotional eating Michelle Kennedy has had issues with stress eating, emotional eating, and boredom eating. Currently this is moderately controlled. Overall mood is stable. Denies suicidal/homicidal ideation. Medication(s): Topamax  50 mg daily with dinner  Plan:  Motivational interviewing as well as evidence-based interventions for health behavior change were utilized today including the discussion of self monitoring techniques, problem-solving barriers and SMART goal setting techniques.  Will increase Rx: Topamax  from 50 mg to 100 mg 1 in the PM #30 with no refills. Discussed distractions to curb eating behaviors. Discussed activities to do with one's hands in the evening  Be sure to  get adequate rest as lack of rest can trigger appetite.  Have plan in place for stressful events.  Consider other rewards besides food.      Morbid Obesity: Current BMI BMI (Calculated): 40.37   Pharmacotherapy Plan Continue and refill metformin  500 mg 2 tablets in the a.m. and 2 tablets in the p.m. with meals.   Michelle Kennedy is currently in the action stage of change. As such, her goal is to continue with weight loss efforts.  She has agreed to the Category 4 plan.  Exercise goals: All adults should avoid inactivity. Some physical activity is better than none, and adults who participate in any amount of physical activity gain some health benefits.  Behavioral modification strategies: increasing lean protein intake, meal planning , better snacking choices, planning for success, increasing vegetables, ways to avoid boredom eating, keep healthy  foods in the home, and mindful eating.  Michelle Kennedy has agreed to follow-up with our clinic in 6 weeks.     Objective:   VITALS: Per patient if applicable, see vitals. GENERAL: Alert and in no acute distress. CARDIOPULMONARY: No increased WOB. Speaking in clear sentences.  PSYCH: Pleasant and cooperative. Speech normal rate and rhythm. Affect is appropriate. Insight and judgement are appropriate. Attention is focused, linear, and appropriate.  NEURO: Oriented as arrived to appointment on time with no prompting.   Attestation Statements:   This was prepared with the assistance of Engineer, civil (consulting).  Occasional wrong-word or sound-a-like substitutions may have occurred due to the inherent limitations of voice recognition   Kirk Peper, DO

## 2023-10-05 ENCOUNTER — Ambulatory Visit: Admitting: Family Medicine

## 2023-10-05 ENCOUNTER — Other Ambulatory Visit: Payer: Self-pay

## 2023-10-05 ENCOUNTER — Ambulatory Visit (INDEPENDENT_AMBULATORY_CARE_PROVIDER_SITE_OTHER)

## 2023-10-05 ENCOUNTER — Encounter: Payer: Self-pay | Admitting: Family Medicine

## 2023-10-05 VITALS — BP 128/62 | HR 91 | Ht 66.0 in | Wt 256.2 lb

## 2023-10-05 DIAGNOSIS — M25561 Pain in right knee: Secondary | ICD-10-CM

## 2023-10-05 DIAGNOSIS — G8929 Other chronic pain: Secondary | ICD-10-CM

## 2023-10-05 DIAGNOSIS — M79671 Pain in right foot: Secondary | ICD-10-CM

## 2023-10-05 DIAGNOSIS — M1711 Unilateral primary osteoarthritis, right knee: Secondary | ICD-10-CM | POA: Diagnosis not present

## 2023-10-05 DIAGNOSIS — S92514A Nondisplaced fracture of proximal phalanx of right lesser toe(s), initial encounter for closed fracture: Secondary | ICD-10-CM | POA: Diagnosis not present

## 2023-10-05 MED ORDER — TRIAMCINOLONE ACETONIDE 32 MG IX SRER
32.0000 mg | Freq: Once | INTRA_ARTICULAR | Status: AC
  Administered 2023-10-05: 32 mg via INTRA_ARTICULAR

## 2023-10-05 NOTE — Progress Notes (Signed)
 Michelle Kennedy am a scribe for Dr. Garlan Juniper.    Michelle Kennedy is a 68 y.o. female who presents to [facility] today for exacerbation of her R knee pain. Pt was last seen by Dr. Alease Hunter on 07/04/23 and was given a R knee Zilretta  injection.  Today, pt reports she would like the knee to be drained prior to getting the injection today.  Additionally she notes some bruising and pain at the right fourth toe.  She thinks she stubbed her toe and it may be broken.  She is treating with comfortable sandals and occasionally buddy taping.  Dx imaging: 07/28/21 R knee XR   Pertinent review of systems: No fevers or chills  Relevant historical information: Patient was recently hospitalized with a Maglio exacerbation.  She is feeling much better now.  She recently also got back from a 30-day cruise halfway around the [place].   Exam:  BP 128/62   Pulse 91   Ht 5\' 6"  (1.676 m)   Wt 256 lb 3.2 oz (116.2 kg)   SpO2 95%   BMI 41.35 kg/m  General: Well Developed, well nourished, and in no acute distress.   MSK: Right knee mild effusion normal-appearing otherwise normal motion.  Right foot bruising at the base of the fourth toe.  Tender palpation.  No malalignment or significant rotation.  Lab and Radiology Results   Zilretta  injection right knee Procedure: Real-time Ultrasound Guided Injection of right knee joint superior lateral patellar space Device: Philips Affiniti 50G Images permanently stored and available for review in PACS Ultrasound evaluation prior to injection reveals a small joint effusion and moderate synovitis Verbal informed consent obtained.  Discussed risks and benefits of procedure. Warned about infection, hyperglycemia bleeding, damage to structures among others. Patient expresses understanding and agreement Time-out conducted.   Noted no overlying erythema, induration, or other signs of local infection.   Skin prepped in a sterile  fashion.   Local anesthesia: Topical Ethyl chloride.   With sterile technique and under real time ultrasound guidance: Zilretta  32 mg injected into knee joint. Fluid seen entering the joint capsule.   Completed without difficulty   Advised to call if fevers/chills, erythema, induration, drainage, or persistent bleeding.   Images permanently stored and available for review in the ultrasound unit.  Impression: Technically successful ultrasound guided injection.  Lot number: [id number]   X-ray images right foot obtained today personally and independently interpreted. Nondisplaced fracture at the base of the fourth digit at the proximal phalanx.  Fracture is oblique from the medial to lateral side Await formal radiology review   Assessment and Plan: 68 y.o. 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  Plan for Zilretta  injection today.  There is not enough fluid to aspirate.  She probably will be due for another Zilretta  injection in about 3 months.  I will be out of the office then and recommend that she schedule with Dr. Cleora Daft.  We did talk about total knee replacement which should be her best option when these shots stop working.  She needs to lose a little bit of weight to get her BMI under 40 and that something that she can work on the summer.  Additionally she has a fracture at her right fourth toe.  It is tiny and nondisplaced and should heal with buddy taping and supportive footwear.  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  PDMP not reviewed this encounter. Orders Placed This Encounter  Procedures   US  LIMITED JOINT SPACE STRUCTURES LOW RIGHT(NO LINKED CHARGES)    Reason for Exam (SYMPTOM  OR DIAGNOSIS REQUIRED):   knee pain    Preferred imaging location?:   [facility] Foot Complete Right    Standing Status:   Future    Number of Occurrences:   1    Expiration Date:   10/04/2024    Reason for Exam  (SYMPTOM  OR DIAGNOSIS REQUIRED):   eval foot pain ? fx    Preferred imaging location?:   [facility]   Meds ordered this encounter  Medications   Triamcinolone  Acetonide (ZILRETTA ) intra-articular injection 32 mg     Discussed warning signs or symptoms. Please see discharge instructions. Patient expresses understanding.   The above documentation has been reviewed and is accurate and complete Garlan Juniper, M.D.

## 2023-10-05 NOTE — Patient Instructions (Signed)
 Thank you for coming in today.   Call or go to the ER if you develop a large red swollen joint with extreme pain or oozing puss.    Schedule with Dr. Cleora Daft in 3 months.

## 2023-10-07 ENCOUNTER — Ambulatory Visit: Payer: Self-pay | Admitting: Family Medicine

## 2023-10-07 NOTE — Progress Notes (Signed)
 Right foot x-ray shows healed fracture at the big toe that looks old.  There is also a healing fracture at the fifth toe.  I thought I saw a fracture of the fourth toe myself but the radiologist did not see it.  I think buddy taping the toe and protecting it like we talked about is a good idea.

## 2023-10-10 ENCOUNTER — Ambulatory Visit (INDEPENDENT_AMBULATORY_CARE_PROVIDER_SITE_OTHER): Admitting: Family

## 2023-10-10 VITALS — BP 116/60 | HR 85 | Temp 98.6°F | Resp 16 | Ht 66.0 in | Wt 256.0 lb

## 2023-10-10 DIAGNOSIS — J45901 Unspecified asthma with (acute) exacerbation: Secondary | ICD-10-CM

## 2023-10-10 DIAGNOSIS — E782 Mixed hyperlipidemia: Secondary | ICD-10-CM

## 2023-10-10 DIAGNOSIS — M810 Age-related osteoporosis without current pathological fracture: Secondary | ICD-10-CM | POA: Diagnosis not present

## 2023-10-10 DIAGNOSIS — Z780 Asymptomatic menopausal state: Secondary | ICD-10-CM | POA: Diagnosis not present

## 2023-10-10 DIAGNOSIS — M1711 Unilateral primary osteoarthritis, right knee: Secondary | ICD-10-CM

## 2023-10-10 DIAGNOSIS — J45909 Unspecified asthma, uncomplicated: Secondary | ICD-10-CM

## 2023-10-10 DIAGNOSIS — R7303 Prediabetes: Secondary | ICD-10-CM | POA: Diagnosis not present

## 2023-10-10 DIAGNOSIS — E890 Postprocedural hypothyroidism: Secondary | ICD-10-CM | POA: Diagnosis not present

## 2023-10-10 DIAGNOSIS — Z85850 Personal history of malignant neoplasm of thyroid: Secondary | ICD-10-CM

## 2023-10-10 DIAGNOSIS — Z1211 Encounter for screening for malignant neoplasm of colon: Secondary | ICD-10-CM

## 2023-10-10 DIAGNOSIS — G4733 Obstructive sleep apnea (adult) (pediatric): Secondary | ICD-10-CM

## 2023-10-10 NOTE — Assessment & Plan Note (Signed)
 S/p thyroidectomy

## 2023-10-10 NOTE — Assessment & Plan Note (Signed)
 Lab Results  Component Value Date   CHOL 170 07/06/2022   HDL 73 07/06/2022   LDLCALC 81 07/06/2022   LDLDIRECT 117.0 02/26/2019   TRIG 85 07/06/2022   CHOLHDL 3 06/28/2020   Not on statin.

## 2023-10-10 NOTE — Assessment & Plan Note (Addendum)
 Lab Results  Component Value Date   TSH 1.23 02/21/2023   She is followed by Dr. Aldona Amel. Update TSH, continue synthroid .

## 2023-10-10 NOTE — Assessment & Plan Note (Signed)
 Update dexa.

## 2023-10-10 NOTE — Assessment & Plan Note (Signed)
 Just had injection.

## 2023-10-10 NOTE — Progress Notes (Signed)
 Subjective:     Patient ID: Michelle Kennedy, female    DOB: 06/15/1955, 68 y.o.   MRN: [id number]  Chief Complaint  Patient presents with   Hospitalization Follow-up    Here for hospital follow up    HPI  Discussed the use of AI scribe software for clinical note transcription with the patient, who gave verbal consent to proceed.  History of Present Illness  Embree Kennedy "Michelle Kennedy" is a 68 year old female who presents for hospital follow up.   She was hospitalized from May 8 through May 10 due to an acute asthma exacerbation. She has been progressively worsening at home with shortness of breath despite nebulizer treatments. She reportedly had exposure to fumigating agents on a cruise ship and also experienced a viral illness. Chest X-ray was negative for pneumonia. She was treated with steroids and magnesium  sulfate while in the emergency room. She continued to have shortness of breath with exertion and was admitted for a total of two nights.  Since discharge, she has continued to improve.  She does not some mild chest discomfort with deep breath and some continued coughing but overall notes improvement.  She reports that her oxygen levels are 94-96% in the morning, rising to 98% during the day, dropping to 94-95% with exertion. She feels her breathing is not yet at full capacity. No wheezing is present.  She is using a new inhaler, Wixela, which she finds more effective than Advair . She completed her steroid course yesterday. She follows with a pulmonary specialist but has not secured a recent appointment.  She manages hypothyroidism with levothyroxine  and diabetes with metformin , recently increased. She takes calcium  supplements but no specific bone health medication. Her last bone density test was in 2022.  Socially, she returned from a 28-day cruise, was active, and broke several toes on her right foot three days after returning, affecting her exercise ability. She plans to resume  pool walking and [place] in [place] to be closer to family.      Health Maintenance Due  Topic Date Due   COVID-19 Vaccine (8 - Pfizer risk 2024-25 season) 08/12/2023   Fecal DNA (Cologuard)  08/20/2023   Medicare Annual Wellness (AWV)  08/24/2023    Past Medical History:  Diagnosis Date   Abnormally small mouth    Achilles tendon disorder, right    Allergy    Anemia    Anxiety    Arthritis    hips and knees   Asthma    daily inhaler, prn inhaler and neb.   Asthma due to environmental allergies    Basal cell carcinoma    nose, s/p mohs.   Breast cancer (HCC) 01/2014   left   Cataract    bilateral - surgery   Chest pain    CHF (congestive heart failure) (HCC)    COPD (chronic obstructive pulmonary disease) (HCC)    Dental bridge present    upper front and lower right   Dental crowns present    x 3   Depression    Esophageal spasm    reports since her chemo and breast surgery  she developed esophageal spasms and reports this is in the past has casue her 02 to desat in the  70s , denies sycnope in relation to this , does report hx of vertigo as well    Family history of anesthesia complication    twin brother aspirated and died on OR table, per pt.  Fibromyalgia    Gallbladder disease    Gallstones    GERD (gastroesophageal reflux disease)    Glaucoma    H/O blood clots    had blood to  PICC line    History of gastric ulcer    as a teenager   History of seizure age 1   as a reaction to Penicillin - no seizures since   History of stomach ulcers    History of thyroid  cancer    s/p thyroidectomy   HTN (hypertension)    Hyperlipidemia    Hypothyroidism    Joint pain    Left knee injury    Liver disorder    seen when she had her hysterectomy , reports she was told it was  " small lesion" ; but asymptomatic    Lymphedema    left arm   Migraines    Multiple food allergies    Obesity    OSA (obstructive sleep apnea) 02/16/2016    CPAP- uses nightly   Palpitations    reports no longer experiences   Personal history of chemotherapy 2015   Personal history of radiation therapy 2015   Left   Pneumonia    x 1 "years ago"   Pre-diabetes    Rheumatoid arthritis (HCC)    Sleep apnea    SOB (shortness of breath)    Swelling of both ankles    Tinnitus    UTI (lower urinary tract infection) 02/17/2014   Vertigo    Vitamin D  deficiency    Wears hearing aid in both ears    hearing loss    Past Surgical History:  Procedure Laterality Date   ABDOMINAL HYSTERECTOMY  2004   complete   ACHILLES TENDON REPAIR Right    APPENDECTOMY  2004   BACK SURGERY  02/09/2023   at [facility]   bilateral eye surgery Bilateral 2022   glaucoma   bladder stimulator  08/29/2022   Medtronic bladder stinulator - with remote   BREAST EXCISIONAL BIOPSY     BREAST LUMPECTOMY Left    2015   BREAST LUMPECTOMY WITH NEEDLE LOCALIZATION AND AXILLARY SENTINEL LYMPH NODE BX Left 02/23/2014   Procedure: BREAST LUMPECTOMY WITH NEEDLE LOCALIZATION AND AXILLARY SENTINEL LYMPH NODE BIOPSY;  Surgeon: Ayesha Lente, MD;  Location: [facility];  Service: General;  Laterality: Left;   BREAST SURGERY  2011   left breast biposy   CHOLECYSTECTOMY  1990   COLONOSCOPY W/ POLYPECTOMY  06/2009   DECOMPRESSIVE LUMBAR LAMINECTOMY LEVEL 1 N/A 06/22/2023   Procedure: LUMBAR FOUR/FIVE LAMINECTOMY WITH FACET CYST EXCISION;  Surgeon: Diedra Fowler, MD;  Location: [facility];  Service: Orthopedics;  Laterality: N/A;   EYE SURGERY Right 2013   exc. warts from underneath eyelid   INCONTINENCE SURGERY  2004   KNEE ARTHROSCOPY Bilateral    x 6 each knee   LIGAMENT REPAIR Right    thumb/wrist   ORIF TOE FRACTURE Right    great toe   PORTACATH PLACEMENT N/A 03/24/2014   Procedure: INSERTION PORT-A-CATH;  Surgeon: Ayesha Lente, MD;  Location: [facility];  Service: General;  Laterality: N/A;; removed    THYROIDECTOMY   2000   TONSILLECTOMY AND ADENOIDECTOMY  2000   TOTAL KNEE ARTHROPLASTY Left 12/03/2017   Procedure: LEFT TOTAL KNEE ARTHROPLASTY;  Surgeon: Liliane Rei, MD;  Location: [facility];  Service: Orthopedics;  Laterality: Left;   TUBAL LIGATION  1981   TUMOR EXCISION  from thoracic spine    Family History  Problem Relation Age of Onset   Alcohol abuse Mother    Arthritis Mother    Hypertension Mother    Bipolar disorder Mother    Breast cancer Mother 31       unconfirmed   Lung cancer Mother 39       smoker   Hyperlipidemia Mother    Stroke Mother    Cancer Mother    Depression Mother    Anxiety disorder Mother    Alcoholism Mother    Drug abuse Mother    Eating disorder Mother    Obesity Mother    Alcohol abuse Father    Hyperlipidemia Father    Kidney disease Father    Diabetes Father    Hypertension Father    Cystic kidney disease Father    Thyroid  disease Father    Liver disease Father    Alcoholism Father    Lung cancer Father    Thyroid  cancer Sister 57       type?; currently 38   Other Sister        ovarian tumor @ 53; TAH/BSO   Breast cancer Sister    Thyroid  cancer Brother        dx 30s; currently 68   Breast cancer Maternal Aunt        dx 37s; deceased 39   Thyroid  cancer Paternal Aunt        All 3 paternal aunts with thyroid  ca in 30s/40s   Lung cancer Paternal Aunt        2 of 3 paternal aunts with lung cancer   Arthritis Maternal Grandmother    Diabetes Paternal Grandmother    Heart disease Other    COPD Other    Asthma Other     Social History   Socioeconomic History   Marital status: Married    Spouse name: Ike Malady   Number of children: 2   Years of education: Not on file   Highest education level: Associate degree: occupational, Scientist, product/process development, or vocational program  Occupational History   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   Social Drivers of Corporate investment banker Strain: Low Risk  (06/05/2023)   Overall Financial Resource Strain (CARDIA)    Difficulty of Paying Living Expenses: Not hard at all  Food Insecurity: No Food Insecurity (10/01/2023)   Hunger Vital Sign    Worried About Running Out of Food in the Last Year: Never true    Ran Out of Food in the Last Year: Never true  Transportation Needs: No Transportation Needs (10/01/2023)   PRAPARE - Administrator, Civil Service (Medical): No    Lack of Transportation (Non-Medical): No  Physical Activity: Inactive (06/05/2023)   Exercise Vital Sign    Days of Exercise per Week: 0 days  Minutes of Exercise per Session: 40 min  Stress: No Stress Concern Present (06/05/2023)   [employer] of Occupational Health - Occupational Stress Questionnaire    Feeling of Stress : Not at all  Social Connections: Moderately Integrated (09/27/2023)   Social Connection and Isolation Panel [NHANES]    Frequency of Communication with Friends and Family: More than three times a week    Frequency of Social Gatherings with Friends and Family: Patient declined    Attends Religious Services: Never    Database administrator or Organizations: Yes    Attends Engineer, structural: More than 4 times per year    Marital Status: Married  Catering manager Violence: Not At Risk (10/01/2023)   Humiliation, Afraid, Rape, and Kick questionnaire    Fear of Current or Ex-Partner: No    Emotionally Abused: No    Physically Abused: No    Sexually Abused: No     Outpatient Medications Prior to Visit  Medication Sig Dispense Refill   albuterol  (PROVENTIL ) (2.5 MG/3ML) 0.083% nebulizer solution Take 3 mLs (2.5 mg total) by nebulization every 6 (six) hours as needed for wheezing or shortness of breath. 75 mL 5   amLODipine  (NORVASC ) 5 MG tablet Take 1 tablet (5 mg total) by mouth daily. 90 tablet 1   CALCIUM  PO Take 1 tablet by mouth every evening.     cholecalciferol  (VITAMIN D3) 25 MCG (1000 UNIT) tablet Take 1,000 Units by mouth every evening.      clopidogrel  (PLAVIX ) 75 MG tablet Take 1 tablet (75 mg total) by mouth daily. 90 tablet 1   EPINEPHrine  0.3 mg/0.3 mL IJ SOAJ injection Inject 0.3 mg into the muscle as needed for anaphylaxis. 2 each 1   fluticasone -salmeterol (WIXELA INHUB) 500-50 MCG/ACT AEPB Inhale 1 puff into the lungs in the morning and at bedtime. 60 each 2   furosemide  (LASIX ) 20 MG tablet TAKE 1 TABLET BY MOUTH DAILY AS  NEEDED FOR SWELLING 90 tablet 3   gabapentin  (NEURONTIN ) 300 MG capsule Take 1 capsule (300 mg total) by mouth 3 (three) times daily. (Patient taking differently: Take 300 mg by mouth 2 (two) times daily.) 270 capsule 1   ipratropium-albuterol  (DUONEB) 0.5-2.5 (3) MG/3ML SOLN Take 3 mLs by nebulization every 6 (six) hours. 360 mL 0   latanoprost  (XALATAN ) 0.005 % ophthalmic solution Place 1 drop into both eyes at bedtime.     levothyroxine  (SYNTHROID ) 150 MCG tablet Take 1 tablet (150 mcg total) by mouth daily. 90 tablet 3   lisinopril  (ZESTRIL ) 20 MG tablet Take 2 tablets (40 mg total) by mouth daily. 180 tablet 1   meclizine  (ANTIVERT ) 25 MG tablet Take 1 tablet (25 mg total) by mouth 3 (three) times daily as needed for dizziness. 30 tablet 0   metFORMIN  (GLUCOPHAGE ) 500 MG tablet 2 tablet in the am and 2 tablets in the pm. 120 tablet 0   methocarbamol  (ROBAXIN ) 500 MG tablet Take 500 mg by mouth 4 (four) times daily as needed for muscle spasms.     montelukast  (SINGULAIR ) 10 MG tablet Take 1 tablet (10 mg total)  by mouth at bedtime. 90 tablet 1   Multiple Vitamin (MULTIVITAMIN ADULT) TABS Take 1 tablet by mouth daily.     Omega-3 Fatty Acids (FISH OIL) 1000 MG CAPS Take 1 capsule by mouth every evening.      pantoprazole  (PROTONIX ) 40 MG tablet Take 1 tablet (40 mg total) by mouth daily. 90 tablet 3   rosuvastatin  (CRESTOR )  5 MG tablet Take 1 tablet (5 mg total) by mouth daily. 90 tablet 1   topiramate  (TOPAMAX ) 100 MG tablet Take 1 tablet (100 mg total) by mouth daily with supper. 30 tablet 0   predniSONE  (DELTASONE ) 10 MG tablet Take 4 tablets (40 mg total) by mouth daily for 3 days, THEN 3 tablets (30 mg total) daily for 3 days, THEN 2 tablets (20 mg total) daily for 3 days, THEN 1 tablet (10 mg total) daily for 3 days. 30 tablet 0   No facility-administered medications prior to visit.    Allergies  Allergen Reactions   Bee Venom Anaphylaxis   Codeine Shortness Of Breath and Rash   Contrast Media [Iodinated Contrast Media] Shortness Of Breath   Gadolinium Derivatives Hives, Rash, Shortness Of Breath and Swelling    "my heart stopped beating"    Latex Anaphylaxis and Rash   Lidocaine  Shortness Of Breath and Swelling    SWELLING OF MOUTH AND THROAT, low BP   Penicillins Shortness Of Breath, Rash and Other (See Comments)    SEIZURE   Pentazocine Lactate Shortness Of Breath and Rash   Shellfish Allergy Shortness Of Breath and Rash   Aspirin Rash and Other (See Comments)    GI UPSET   Erythromycin Swelling and Rash    SWELLING OF JOINTS   Symbicort  [Budesonide -Formoterol  Fumarate] Other (See Comments)    BURNING OF TONGUE AND LIPS   Compazine  [Prochlorperazine  Maleate] Rash    Rash on face,chest, arms, back   Sulfonamide Derivatives Rash    ROS    See HPI Objective:     Physical Exam Constitutional:      General: She is not in acute distress.    Appearance: Normal appearance. She is well-developed.  HENT:     Head: Normocephalic and atraumatic.     Right Ear: External ear  normal.     Left Ear: External ear normal.  Eyes:     General: No scleral icterus. Neck:     Thyroid : No thyromegaly.  Cardiovascular:     Rate and Rhythm: Normal rate and regular rhythm.     Heart sounds: Normal heart sounds. No murmur heard. Pulmonary:     Effort: Pulmonary effort is normal. No respiratory distress.     Breath sounds: Normal breath sounds. No wheezing.  Musculoskeletal:     Cervical back: Neck supple.  Skin:    General: Skin is warm and dry.  Neurological:     Mental Status: She is alert and oriented to person, place, and time.  Psychiatric:        Mood and Affect: Mood normal.        Behavior: Behavior normal.        Thought Content: Thought content normal.        Judgment: Judgment normal.      BP 116/60 (BP Location: Right Arm, Patient Position: Sitting, Cuff Size: Large)   Pulse 85   Temp 98.6 F (37 C) (Oral)   Resp 16   Ht 5\' 6"  (1.676 m)   Wt 256 lb (116.1 kg)   SpO2 99%   BMI 41.32 kg/m  Wt Readings from Last 3 Encounters:  10/10/23 256 lb (116.1 kg)  10/05/23 256 lb 3.2 oz (116.2 kg)  10/04/23 250 lb (113.4 kg)       Assessment & Plan:   Problem List Items Addressed This Visit       Unprioritized   Postsurgical hypothyroidism (Chronic)   Lab Results  Component  Value Date   TSH 1.23 02/21/2023   She is followed by Dr. Aldona Amel. Update TSH, continue synthroid .       Relevant Orders   TSH   Prediabetes - Primary   Lab Results  Component Value Date   HGBA1C 5.6 06/19/2023   A1C stable.       Postmenopausal osteoporosis   Update dexa.       OSA on CPAP   Continues cpap, followed by pulmonology- Dr. Gaynell Keeler.      OA (osteoarthritis) of knee   Just had injection.       Hyperlipidemia   Lab Results  Component Value Date   CHOL 170 07/06/2022   HDL 73 07/06/2022   LDLCALC 81 07/06/2022   LDLDIRECT 117.0 02/26/2019   TRIG 85 07/06/2022   CHOLHDL 3 06/28/2020   Not on statin.       History of thyroid  cancer    S/p thyroidectomy.       Acute asthma exacerbation   Resolved. Completed prednisone  taper. Continue Wixela, advised pt to schedule routine follow up with pulmonology.       Other Visit Diagnoses       Postmenopausal estrogen deficiency       Relevant Orders   DG Bone Density     Screening for colon cancer       Relevant Orders   Cologuard       I have discontinued Emerie D. Tschirhart "[name]"'s predniSONE . I am also having her maintain her CALCIUM  PO, cholecalciferol , Fish Oil, Multivitamin Adult, latanoprost , meclizine , gabapentin , levothyroxine , methocarbamol , clopidogrel , rosuvastatin , pantoprazole , EPINEPHrine , amLODipine , lisinopril , montelukast , albuterol , furosemide , fluticasone -salmeterol, ipratropium-albuterol , metFORMIN , and topiramate .  No orders of the defined types were placed in this encounter.

## 2023-10-10 NOTE — Assessment & Plan Note (Signed)
 Lab Results  Component Value Date   HGBA1C 5.6 06/19/2023   A1C stable.

## 2023-10-10 NOTE — Assessment & Plan Note (Signed)
 Resolved. Completed prednisone  taper. Continue Wixela, advised pt to schedule routine follow up with pulmonology.

## 2023-10-10 NOTE — Assessment & Plan Note (Signed)
 Continues cpap, followed by pulmonology- Dr. Gaynell Keeler.

## 2023-10-11 ENCOUNTER — Ambulatory Visit: Payer: Self-pay | Admitting: Family

## 2023-10-11 LAB — TSH: TSH: 3.99 u[IU]/mL (ref 0.35–5.50)

## 2023-10-20 LAB — COLOGUARD

## 2023-10-30 ENCOUNTER — Encounter: Payer: Self-pay | Admitting: Family Medicine

## 2023-10-30 DIAGNOSIS — M1711 Unilateral primary osteoarthritis, right knee: Secondary | ICD-10-CM

## 2023-10-30 DIAGNOSIS — G8929 Other chronic pain: Secondary | ICD-10-CM

## 2023-10-30 NOTE — Telephone Encounter (Signed)
 Referral placed for consult for knee replacement to [facility].

## 2023-10-30 NOTE — Telephone Encounter (Signed)
 Per visit note 10/05/23:  Assessment and Plan: 68 y.o. 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  Plan for Zilretta  injection today.  There is not enough fluid to aspirate.  She probably will be due for another Zilretta  injection in about 3 months.  I will be out of the office then and recommend that she schedule with Dr. Cleora Daft.  We did talk about total knee replacement which should be her best option when these shots stop working.  She needs to lose a little bit of weight to get her BMI under 40 and that something that she can work on the summer.   Additionally she has a fracture at her right fourth toe.  It is tiny and nondisplaced and should heal with buddy taping and supportive footwear.  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

## 2023-11-12 ENCOUNTER — Other Ambulatory Visit: Payer: Self-pay | Admitting: Bariatrics

## 2023-11-15 ENCOUNTER — Telehealth (HOSPITAL_BASED_OUTPATIENT_CLINIC_OR_DEPARTMENT_OTHER): Payer: Self-pay

## 2023-11-15 ENCOUNTER — Encounter: Payer: Self-pay | Admitting: Bariatrics

## 2023-11-15 ENCOUNTER — Ambulatory Visit: Admitting: Bariatrics

## 2023-11-15 VITALS — BP 105/72 | HR 75 | Temp 97.8°F | Ht 66.0 in | Wt 252.0 lb

## 2023-11-15 DIAGNOSIS — Z6841 Body Mass Index (BMI) 40.0 and over, adult: Secondary | ICD-10-CM | POA: Diagnosis not present

## 2023-11-15 DIAGNOSIS — R632 Polyphagia: Secondary | ICD-10-CM

## 2023-11-15 DIAGNOSIS — E890 Postprocedural hypothyroidism: Secondary | ICD-10-CM | POA: Diagnosis not present

## 2023-11-15 DIAGNOSIS — E66813 Obesity, class 3: Secondary | ICD-10-CM

## 2023-11-15 MED ORDER — WEGOVY 0.25 MG/0.5ML ~~LOC~~ SOAJ: 0.2500 mg | SUBCUTANEOUS | 0 refills | Status: DC

## 2023-11-15 NOTE — Progress Notes (Signed)
 WEIGHT SUMMARY AND BIOMETRICS  Weight Lost Since Last Visit: 0  Weight Gained Since Last Visit: 2lb   Vitals Temp: 97.8 F (36.6 C) BP: 105/72 Pulse Rate: 75 SpO2: 96 %   Anthropometric Measurements Height: 5' 6 (1.676 m) Weight: 252 lb (114.3 kg) BMI (Calculated): 40.69 Weight at Last Visit: 250lb Weight Lost Since Last Visit: 0 Weight Gained Since Last Visit: 2lb Starting Weight: 275lb Total Weight Loss (lbs): 23 lb (10.4 kg)   Body Composition  Body Fat %: 49.4 % Fat Mass (lbs): 124.6 lbs Muscle Mass (lbs): 121.4 lbs Total Body Water  (lbs): 88 lbs Visceral Fat Rating : 17   Other Clinical Data Fasting: no Labs: no Today's Visit #: 18 Starting Date: 07/28/21    OBESITY Michelle Kennedy is here to discuss her progress with her obesity treatment plan along with follow-up of her obesity related diagnoses.    Nutrition Plan: the Category 4 plan - 75% adherence.  Current exercise: swimming and [employer]  Interim History:  She is up 2 lbs since his last viist.  Eating all of the food on the plan., Protein intake is as prescribed, Is not skipping meals, Not journaling consistently., Water  intake is adequate., and Reports polyphagia   Pharmacotherapy: Michelle Kennedy is on Metformin  2 in the am, and 1 in the pm.  Adverse side effects: Diarrhea, mild when she tried to go up to 2 in the am, and 2 in the pm.  Hunger is poorly controlled.  Cravings are moderately controlled.  Assessment/Plan:   Michelle Kennedy endorses excessive hunger.  Medication(s): Metformin   Effects of medication:  moderately controlled. Cravings are moderately controlled.   Plan: Medication(s): Metformin  500 mg (2 tablets) in the am, and 1 tablet in the pm. She will try to do Metformin  500 mg 2 tablets in the am and pm if tolerated.  Will begin Wegovy  0.25 mg into the skin weekly, 1 month  supply with no refills.  Will increase water , protein and fiber to help assuage hunger.  Will minimize foods that have a high glucose index/load to minimize reactive hypoglycemia.   Postsurgical hypothyroidism:   Stable.  Does not report symptoms associated with uncontrolled hypothyroidism. Medication(s): Levothyroxine  150 mcg daily. Lab Results  Component Value Date   TSH 3.99 10/10/2023    Plan: Continue levothyroxine  at current dose. Counseling: The correct way to take levothyroxine  is fasting, with water , separated by at least 30 minutes from breakfast, and separated by more than 4 hours from calcium , iron, multivitamins, acid reflux medications (PPIs).     Morbid Obesity: Current BMI BMI (Calculated): 40.69   Pharmacotherapy Plan Start  Wegovy  0.25 mg SQ weekly, Will continue her Metformin  500 mg 2 in the am, and 1 in the pm.   Michelle Kennedy is currently in the action stage of change. As such, her goal is to continue with weight loss efforts.  She has agreed to the Category 4 plan.  Exercise goals: Older adults should determine their level of effort for physical activity relative to their level of fitness.   Behavioral modification strategies: increasing lean protein intake, no meal skipping, meal planning , increase water  intake, better snacking choices, planning for success, increasing vegetables, increasing fiber rich foods, keep healthy foods in the home, weigh protein portions, measure portion sizes, and mindful eating.  Michelle Kennedy has agreed to follow-up with our clinic in 6 weeks. Will have fasting labs.    Objective:   VITALS: Per patient if applicable, see vitals. GENERAL: Alert and in no acute distress. CARDIOPULMONARY: No increased WOB. Speaking in clear sentences.  PSYCH: Pleasant and cooperative. Speech normal rate and rhythm. Affect is appropriate. Insight and judgement are appropriate. Attention is focused, linear, and appropriate.  NEURO: Oriented as arrived to  appointment on time with no prompting.   Attestation Statements:   This was prepared with the assistance of Engineer, civil (consulting).  Occasional wrong-word or sound-a-like substitutions may have occurred due to the inherent limitations of voice recognition   Clayborne Daring, DO

## 2023-11-19 ENCOUNTER — Other Ambulatory Visit (INDEPENDENT_AMBULATORY_CARE_PROVIDER_SITE_OTHER): Payer: Self-pay

## 2023-11-19 ENCOUNTER — Ambulatory Visit (INDEPENDENT_AMBULATORY_CARE_PROVIDER_SITE_OTHER): Admitting: Orthopaedic Surgery

## 2023-11-19 VITALS — Ht 67.0 in | Wt 261.0 lb

## 2023-11-19 DIAGNOSIS — M1711 Unilateral primary osteoarthritis, right knee: Secondary | ICD-10-CM

## 2023-11-19 DIAGNOSIS — G8929 Other chronic pain: Secondary | ICD-10-CM

## 2023-11-19 DIAGNOSIS — M25561 Pain in right knee: Secondary | ICD-10-CM

## 2023-11-19 NOTE — Progress Notes (Signed)
 The patient is a very pleasant 68 year old female that I am seeing for the first time.  She send to me from Dr. Artist Lloyd to evaluate and treat significant arthritis of her right knee.  She has tried failed all forms conservative treatment.  She has a remote history of a left knee replacement that was done by one of my colleagues in town many years ago.  Her husband is with her today.  She has been on the weight loss journey and has lost a significant mount of weight.  Her BMI today is 40.88.  She had just come off of Ozempic  and is put some weight back on.  I did review all of her past medical history and medications within epic.  She is a diabetic and is on Plavix  as well.  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  I believe she may be on different medications now for weight loss.  She does report that her left total knee is doing well.  Her right knee hurts on a daily basis and it is detrimentally affecting her mobility, her quality of life and actives daily living.  It is gotten worse for him well over a year now.  On examination right knee there is varus malalignment that is correctable.  There is significant medial joint line tenderness as well as patellofemoral crepitation and pain throughout the arc of motion of her knee.  It is ligamentously stable with good range of motion.  2 views of her right knee show severe end-stage tricompartmental bone-on-bone arthritis involving the medial compartment and patellofemoral joint.  There are osteophytes in all 3 compartments and varus malalignment.  Having had knee replacement surgery before she is fully aware of the risk and benefits of the surgery.  With the insurance plan that she is under we cannot schedule the surgery until her BMI is below 40 which is very close.  Will see her back in just 3 weeks with a repeat weight and BMI calculation.  We want her to wear light close and then and to stand up straight and will hopefully have a better number so  then we can proceed with scheduling her for a right total knee arthroplasty.  She agrees with this treatment plan.  All questions and concerns were addressed and answered.

## 2023-11-22 ENCOUNTER — Ambulatory Visit (INDEPENDENT_AMBULATORY_CARE_PROVIDER_SITE_OTHER): Admitting: Family

## 2023-11-22 VITALS — BP 118/76 | HR 78 | Ht 67.0 in | Wt 259.4 lb

## 2023-11-22 DIAGNOSIS — L0390 Cellulitis, unspecified: Secondary | ICD-10-CM | POA: Diagnosis not present

## 2023-11-22 MED ORDER — DOXYCYCLINE HYCLATE 100 MG PO TABS: 100.0000 mg | ORAL_TABLET | Freq: Two times a day (BID) | ORAL | 0 refills | Status: DC

## 2023-11-22 NOTE — Progress Notes (Signed)
 Michelle Kennedy is a 68 y.o. female with the following history as recorded in EpicCare:  Patient Active Problem List   Diagnosis Date Noted   Asthma exacerbation 09/28/2023   Acute asthma exacerbation 09/27/2023   Chronic pain syndrome 09/27/2023   Hypothyroidism 09/27/2023   History of CVA (cerebrovascular accident) 2017 09/27/2023   History of DVT (deep vein thrombosis) of right upper extremity 2015 09/27/2023   Status post laminectomy 06/22/2023   Cyst of lumbar facet joint 06/22/2023   Pre-op evaluation 06/05/2023   Status post insertion of nerve stimulator 03/19/2023   History of thyroid  cancer    SOB (shortness of breath) on exertion 10/03/2022   Polyphagia 04/27/2022   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   Abdominal pannus 03/02/2022   Vertigo 02/07/2022   Peroneal tendinitis of left lower extremity 02/07/2022   Visceral obesity 02/01/2022   Sacral pain 09/20/2021   Pain in right knee 09/13/2021   Chronic left-sided low back pain with left-sided sciatica 04/27/2021   Nondisplaced fracture of middle phalanx of left lesser toe(s), initial encounter for closed fracture 09/17/2020   Chronic diastolic CHF (congestive heart failure) (HCC) 08/23/2019   Retinal artery occlusion 08/22/2019   History of total left knee replacement 12/07/2017   Unilateral primary osteoarthritis, right knee 12/03/2017   Morbid obesity (HCC) 01/01/2017   Essential hypertension 10/18/2016   Constipation 10/04/2016   Vitamin D  deficiency 10/04/2016   Prediabetes 10/04/2016   BMI 39.0-39.9,adult 09/20/2016   OSA on CPAP 02/16/2016   Malignant neoplasm of thyroid  gland (HCC) 10/11/2015   De Quervain's tenosynovitis, bilateral 09/15/2015   Trigger finger, acquired 06/22/2015   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/10/2015   Postmenopausal osteoporosis 12/08/2014   Preventative health care 12/01/2014   DVT (deep venous thrombosis) (HCC) 08/28/2014   Lymphedema of arm 08/28/2014    Mucositis due to chemotherapy 06/02/2014   Menopausal syndrome (hot flashes) 12/31/2013   Breast cancer of upper-outer quadrant of left female breast (HCC) 12/26/2013   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 75 mL 5   amLODipine  (NORVASC ) 5 MG tablet Take 1 tablet (5 mg total) by mouth daily. 90 tablet 1   CALCIUM  PO Take 1 tablet by mouth every evening.     cholecalciferol  (VITAMIN D3) 25 MCG (1000 UNIT) tablet Take 1,000 Units by mouth every evening.      clopidogrel  (PLAVIX ) 75 MG tablet Take 1 tablet (75 mg total) by mouth daily. 90 tablet 1   doxycycline  (VIBRA -TABS) 100 MG tablet Take 1 tablet (100 mg total) by mouth 2 (two) times daily. 14 tablet 0   EPINEPHrine  0.3 mg/0.3 mL IJ SOAJ injection Inject 0.3 mg into the muscle as needed for anaphylaxis. 2 each 1   fluticasone -salmeterol (WIXELA INHUB) 500-50 MCG/ACT AEPB Inhale 1 puff into the lungs in the morning and at bedtime. 60 each 2   furosemide  (LASIX ) 20 MG tablet TAKE 1 TABLET BY MOUTH DAILY AS  NEEDED FOR SWELLING 90 tablet 3   gabapentin  (NEURONTIN )  300 MG capsule Take 1 capsule (300 mg total) by mouth 3 (three) times daily. (Patient taking differently: Take 300 mg by mouth 2 (two) times daily.) 270 capsule 1   ipratropium-albuterol  (DUONEB) 0.5-2.5 (3) MG/3ML SOLN Take 3 mLs by nebulization every 6 (six) hours. 360 mL 0   latanoprost  (XALATAN ) 0.005 % ophthalmic solution Place 1 drop into both eyes at  bedtime.     levothyroxine  (SYNTHROID ) 150 MCG tablet Take 1 tablet (150 mcg total) by mouth daily. 90 tablet 3   lisinopril  (ZESTRIL ) 20 MG tablet Take 2 tablets (40 mg total) by mouth daily. 180 tablet 1   meclizine  (ANTIVERT ) 25 MG tablet Take 1 tablet (25 mg total) by mouth 3 (three) times daily as needed for dizziness. 30 tablet 0   metFORMIN  (GLUCOPHAGE ) 500 MG tablet TAKE 2 TABLETS BY MOUTH IN THE MORNING AND 2 IN THE EVENING 120 tablet 0   methocarbamol  (ROBAXIN ) 500 MG tablet Take 500 mg by mouth 4 (four) times daily as needed for muscle spasms.     montelukast  (SINGULAIR ) 10 MG tablet Take 1 tablet (10 mg total) by mouth at bedtime. 90 tablet 1   Multiple Vitamin (MULTIVITAMIN ADULT) TABS Take 1 tablet by mouth daily.     Omega-3 Fatty Acids (FISH OIL) 1000 MG CAPS Take 1 capsule by mouth every evening.      pantoprazole  (PROTONIX ) 40 MG tablet Take 1 tablet (40 mg total) by mouth daily. 90 tablet 3   rosuvastatin  (CRESTOR ) 5 MG tablet Take 1 tablet (5 mg total) by mouth daily. 90 tablet 1   Semaglutide -Weight Management (WEGOVY ) 0.25 MG/0.5ML SOAJ Inject 0.25 mg into the skin once a week. 2 mL 0   topiramate  (TOPAMAX ) 100 MG tablet Take 1 tablet (100 mg total) by mouth daily with supper. 30 tablet 0   No current facility-administered medications for this visit.    Allergies: Bee venom, Codeine, Contrast media [iodinated contrast media], Gadolinium derivatives, Latex, Lidocaine , Penicillins, Pentazocine lactate, Shellfish allergy, Aspirin, Erythromycin, Symbicort  [budesonide -formoterol  fumarate], Compazine  [prochlorperazine  maleate], and Sulfonamide derivatives  Past Medical History:  Diagnosis Date   Abnormally small mouth    Achilles tendon disorder, right    Allergy    Anemia    Anxiety    Arthritis    hips and knees   Asthma    daily inhaler, prn inhaler and neb.   Asthma due to environmental allergies    Basal cell carcinoma    nose, s/p mohs.   Breast cancer (HCC)  01/2014   left   Cataract    bilateral - surgery   Chest pain    CHF (congestive heart failure) (HCC)    COPD (chronic obstructive pulmonary disease) (HCC)    Dental bridge present    upper front and lower right   Dental crowns present    x 3   Depression    Esophageal spasm    reports since her chemo and breast surgery  she developed esophageal spasms and reports this is in the past has casue her 02 to desat in the  70s , denies sycnope in relation to this , does report hx of vertigo as well    Family history of anesthesia complication    twin brother aspirated and died on OR table, per pt.   Fibromyalgia    Gallbladder disease    Gallstones    GERD (gastroesophageal reflux disease)    Glaucoma    H/O blood clots    had blood  to  PICC line    History of gastric ulcer    as a teenager   History of seizure age 68   as a reaction to Penicillin - no seizures since   History of stomach ulcers    History of thyroid  cancer    s/p thyroidectomy   HTN (hypertension)    Hyperlipidemia    Hypothyroidism    Joint pain    Left knee injury    Liver disorder    seen when she had her hysterectomy , reports she was told it was   small lesion ; but asymptomatic    Lymphedema    left arm   Migraines    Multiple food allergies    Obesity    OSA (obstructive sleep apnea) 02/16/2016   CPAP- uses nightly   Palpitations    reports no longer experiences   Personal history of chemotherapy 2015   Personal history of radiation therapy 2015   Left   Pneumonia    x 1 years ago   Pre-diabetes    Rheumatoid arthritis (HCC)    Sleep apnea    SOB (shortness of breath)    Swelling of both ankles    Tinnitus    UTI (lower urinary tract infection) 02/17/2014   Vertigo    Vitamin D  deficiency    Wears hearing aid in both ears    hearing loss    Past Surgical History:  Procedure Laterality Date   ABDOMINAL HYSTERECTOMY  2004   complete   ACHILLES TENDON REPAIR Right    APPENDECTOMY   2004   BACK SURGERY  02/09/2023   at [facility]   bilateral eye surgery Bilateral 2022   glaucoma   bladder stimulator  08/29/2022   Medtronic bladder stinulator - with remote   BREAST EXCISIONAL BIOPSY     BREAST LUMPECTOMY Left    2015   BREAST LUMPECTOMY WITH NEEDLE LOCALIZATION AND AXILLARY SENTINEL LYMPH NODE BX Left 02/23/2014   Procedure: BREAST LUMPECTOMY WITH NEEDLE LOCALIZATION AND AXILLARY SENTINEL LYMPH NODE BIOPSY;  Surgeon: Morene Olives, MD;  Location: [facility];  Service: General;  Laterality: Left;   BREAST SURGERY  2011   left breast biposy   CHOLECYSTECTOMY  1990   COLONOSCOPY W/ POLYPECTOMY  06/2009   DECOMPRESSIVE LUMBAR LAMINECTOMY LEVEL 1 N/A 06/22/2023   Procedure: LUMBAR FOUR/FIVE LAMINECTOMY WITH FACET CYST EXCISION;  Surgeon: Georgina Ozell LABOR, MD;  Location: [facility];  Service: Orthopedics;  Laterality: N/A;   EYE SURGERY Right 2013   exc. warts from underneath eyelid   INCONTINENCE SURGERY  2004   KNEE ARTHROSCOPY Bilateral    x 6 each knee   LIGAMENT REPAIR Right    thumb/wrist   ORIF TOE FRACTURE Right    great toe   PORTACATH PLACEMENT N/A 03/24/2014   Procedure: INSERTION PORT-A-CATH;  Surgeon: Morene Olives, MD;  Location: [facility];  Service: General;  Laterality: N/A;; removed    THYROIDECTOMY  2000   TONSILLECTOMY AND ADENOIDECTOMY  2000   TOTAL KNEE ARTHROPLASTY Left 12/03/2017   Procedure: LEFT TOTAL KNEE ARTHROPLASTY;  Surgeon: Melodi Lerner, MD;  Location: [facility];  Service: Orthopedics;  Laterality: Left;   TUBAL LIGATION  1981   TUMOR EXCISION     from thoracic spine    Family History  Problem Relation Age of Onset   Alcohol abuse Mother    Arthritis Mother    Hypertension Mother    Bipolar  disorder Mother    Breast cancer Mother 59       unconfirmed   Lung cancer Mother 67       smoker   Hyperlipidemia Mother    Stroke Mother    Cancer Mother    Depression Mother     Anxiety disorder Mother    Alcoholism Mother    Drug abuse Mother    Eating disorder Mother    Obesity Mother    Alcohol abuse Father    Hyperlipidemia Father    Kidney disease Father    Diabetes Father    Hypertension Father    Cystic kidney disease Father    Thyroid  disease Father    Liver disease Father    Alcoholism Father    Lung cancer Father    Thyroid  cancer Sister 62       type?; currently 57   Other Sister        ovarian tumor @ 46; TAH/BSO   Breast cancer Sister    Thyroid  cancer Brother        dx 30s; currently 5   Breast cancer Maternal Aunt        dx 46s; deceased 48   Thyroid  cancer Paternal Aunt        All 3 paternal aunts with thyroid  ca in 30s/40s   Lung cancer Paternal Aunt        2 of 3 paternal aunts with lung cancer   Arthritis Maternal Grandmother    Diabetes Paternal Grandmother    Heart disease Other    COPD Other    Asthma Other     Social History   Tobacco Use   Smoking status: Never   Smokeless tobacco: Never  Substance Use Topics   Alcohol use: No    Alcohol/week: 0.0 standard drinks of alcohol    Subjective:   Concerned about red spot on top of left ear that has been present for the past 2 weeks; no known insect bite; notes that did initially respond to topical antibiotics but symptoms have since returned; no fever; denies any new soaps, foods, detergents or medications.    Objective:  Vitals:   11/22/23 1106  BP: 118/76  Pulse: 78  SpO2: 96%  Weight: 259 lb 6.4 oz (117.7 kg)  Height: 5' 7 (1.702 m)    General: Well developed, well nourished, in no acute distress  Skin : Warm and dry. Localized area of redness on left ear drum c/w possible insect bite Head: Normocephalic and atraumatic  Eyes: Sclera and conjunctiva clear; pupils round and reactive to light; extraocular movements intact  Ears: External normal; canals clear;  Lungs: Respirations unlabored;  Neurologic: Alert and oriented; speech intact; face symmetrical;  moves all extremities well; CNII-XII intact without focal deficit   Assessment:  1. Cellulitis, unspecified cellulitis site     Plan:  Suspect secondary to insect bite; trial of Doxycycline  100 mg bid x 7 days; apply warm moist compresses; follow up worse, no better.   No follow-ups on file.  No orders of the defined types were placed in this encounter.   Requested Prescriptions   Signed Prescriptions Disp Refills   doxycycline  (VIBRA -TABS) 100 MG tablet 14 tablet 0    Sig: Take 1 tablet (100 mg total) by mouth 2 (two) times daily.

## 2023-12-03 ENCOUNTER — Other Ambulatory Visit: Payer: Self-pay | Admitting: Family

## 2023-12-03 NOTE — Telephone Encounter (Signed)
 Due for refil

## 2023-12-05 ENCOUNTER — Other Ambulatory Visit: Payer: Self-pay | Admitting: Family

## 2023-12-05 NOTE — Telephone Encounter (Unsigned)
 Copied from CRM [id number]. Topic: Clinical - Medication Refill >> Dec 05, 2023 11:41 AM Paige D wrote: Medication: Meclizine  25 MG   Has the patient contacted their pharmacy? Yes (Agent: If no, request that the patient contact the pharmacy for the refill. If patient does not wish to contact the pharmacy document the reason why and proceed with request.) (Agent: If yes, when and what did the pharmacy advise?)  This is the patient's preferred pharmacy:  [facility] [address] [place], [place] - [address] [place] [address] Phone: [phone] Fax: [phone]    Is this the correct pharmacy for this prescription? Yes If no, delete pharmacy and type the correct one.   Has the prescription been filled recently? No  Is the patient out of the medication? Yes  Has the patient been seen for an appointment in the last year OR does the patient have an upcoming appointment? Yes  Can we respond through MyChart? Yes  Agent: Please be advised that Rx refills may take up to 3 business days. We ask that you follow-up with your pharmacy.

## 2023-12-06 MED ORDER — MECLIZINE HCL 25 MG PO TABS: 25.0000 mg | ORAL_TABLET | Freq: Three times a day (TID) | ORAL | 0 refills | Status: AC | PRN

## 2023-12-10 ENCOUNTER — Telehealth: Payer: Self-pay | Admitting: Orthopedic Surgery

## 2023-12-10 ENCOUNTER — Ambulatory Visit (INDEPENDENT_AMBULATORY_CARE_PROVIDER_SITE_OTHER): Admitting: Orthopaedic Surgery

## 2023-12-10 ENCOUNTER — Encounter: Payer: Self-pay | Admitting: Orthopaedic Surgery

## 2023-12-10 VITALS — Ht 67.0 in | Wt 254.0 lb

## 2023-12-10 DIAGNOSIS — M1711 Unilateral primary osteoarthritis, right knee: Secondary | ICD-10-CM

## 2023-12-10 DIAGNOSIS — M25561 Pain in right knee: Secondary | ICD-10-CM

## 2023-12-10 DIAGNOSIS — G8929 Other chronic pain: Secondary | ICD-10-CM | POA: Diagnosis not present

## 2023-12-10 NOTE — Telephone Encounter (Signed)
 Orthopedic Telephone Note  Patient did not return for her regular scheduled follow-up so I called her this afternoon to check in on how she was doing.  She states that she has been doing great.  She has not had any back or radiating leg pain.  She is pleased with how her surgery went.  She has been able to return to all activities without issue.  Since she is doing well and is not having any symptoms after a decompression surgery, I told her that she can follow-up on an as-needed basis.  Michelle DELENA Ada, MD Orthopedic Surgeon   Pre-operative Scores   ODI: 66 VAS back: 8 VAS leg: 7  2-Month Postoperative Scores  ODI: 18 VAS back 2 VAS leg: 0

## 2023-12-10 NOTE — Progress Notes (Signed)
 The patient is a 68 year old female well-known to us .  She is in need of a right total knee arthroplasty due to a well-documented osteoarthritis of her right knee.  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  She has a remote history of a left knee replacement and has done well.  She has been on a weight loss journey.  She is a diabetic but under excellent control.  Her BMI has been over 40.  She is here today for a weight check and BMI calculation.  Today her BMI is now down to 39.78.  She has been working really hard on her weight loss journey and should be commended for this.  Her right knee is painful throughout the arc of motion of her knee with known bone-on-bone arthritis of her right knee that was reviewed again on x-ray today.  There is complete loss of joint space with varus malalignment and bone-on-bone wear medial and patellofemoral joint with osteophytes.  The knee feels lunacy stable but is certainly very painful.  Her pain is daily.  At this point her right knee pain is detrimentally affecting her mobility, her quality of life and her actives daily living and the pain is 10 out of 10.  We are ready with scheduling her for a right total knee arthroplasty.  She is on Plavix  so she will stop Plavix  a week prior to surgery.  She will keep her blood glucose under good control and will stay on her weight loss journey.  All questions and concerns were answered and addressed.  Will work on scheduling her for right total knee arthroplasty.

## 2023-12-13 ENCOUNTER — Telehealth: Payer: Self-pay

## 2023-12-13 NOTE — Telephone Encounter (Signed)
 Patient left me a voice mail to return her call to discuss scheduling TKA.  I called patient back and left voice mail for return call.

## 2023-12-16 ENCOUNTER — Other Ambulatory Visit: Payer: Self-pay | Admitting: Bariatrics

## 2023-12-19 ENCOUNTER — Encounter: Payer: Self-pay | Admitting: Orthopaedic Surgery

## 2023-12-20 ENCOUNTER — Other Ambulatory Visit: Payer: Self-pay | Admitting: Bariatrics

## 2023-12-21 ENCOUNTER — Encounter: Payer: Self-pay | Admitting: Orthopedic Surgery

## 2023-12-22 ENCOUNTER — Other Ambulatory Visit: Payer: Self-pay | Admitting: Family

## 2023-12-24 ENCOUNTER — Other Ambulatory Visit: Payer: Self-pay | Admitting: Bariatrics

## 2023-12-24 ENCOUNTER — Other Ambulatory Visit: Payer: Self-pay | Admitting: Hematology and Oncology

## 2023-12-24 ENCOUNTER — Telehealth: Payer: Self-pay

## 2023-12-24 DIAGNOSIS — Z1231 Encounter for screening mammogram for malignant neoplasm of breast: Secondary | ICD-10-CM

## 2023-12-24 NOTE — Telephone Encounter (Signed)
 Pt called back and had (L) DCIS in 2015 and completed her AI therapy last year. She hasn't been seen since last year bc she was supposed to move to the [facility], but some things have come up to prevent that. She found a lump on her left breast 4 weeks ago and has noticed it has increased in size and is elongated and hard, which has changed from when she first noticed it, which she states was soft and round. Pt was offered appt with Powell Lessen, NP for further evaluation. She accepted and will be seen 12/26/23 at 0800.

## 2023-12-24 NOTE — Telephone Encounter (Signed)
 Pt called and LVM states she has noticed a new lump in her left breast. She is concerned and would like to be seen.  Pt has not been seen since 08/2022 and is no longer on AI therapy. Per MD last note, pt was moving to the [facility].  LVM for pt to call back to further discuss.

## 2023-12-25 NOTE — Progress Notes (Unsigned)
 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 M0 stage IIB Adjuvant chemotherapy Completed 4 cycles of dose dense Adriamycin  and Cytoxan . Followed by Abraxane  X 12 completed 08/18/14, status post radiation completed June 2016, started anastrozole  1 mg daily 11/20/2014 (also enrolled on [name] clinical trial randomized to Ibrance ) completed 2 years 01/23/2017   Breast Cancer Surveillance: 1. Breast exam: 09/18/2022: Benign 2. Mammogram 12/22/2021: Benign breast density category B 3.  Mammogram 01/24/2023 -benign     Chemo induced peripheral neuropathy Hospitalization: 08/22/2019-08/23/2019 severe headache with right-sided visual disturbance: Stroke work-up negative, started on Plavix  75 mg daily   She has a 20 acres in [place]  [address] and is hoping to build a house and retire there at some point in the future.  She had plans to move sometime over the past 1 year but continues to live in [place].  Completed treatment with AI earlier this year.  12/26/2023 -noticed new lump in left breast about 4 weeks ago.  Has become bigger, elongated, and hard harder over the past 4 weeks. Will get STAT diagnostic mammogram of left breast along with STAT ultrasound of palpable mass. Will CC: Dr. Gudena.   The mass is slightly tender to palpate. Easier to feel when patient is on her  right side.   Plan STAT diagnostic mammogram of left breast ordered.  STAT targeted ultrasound of palpable mass.  Will follow up with patient upon completion of imaging studies.  Will CC: Dr. Gudena with today's progress note and plan.     The patient understands the plans discussed today and is in agreement with them.  She knows to contact our office if she develops concerns prior to her next appointment.  I provided 25 minutes of face-to-face time during this encounter and > 50% was spent counseling as documented under my assessment and plan.    Powell FORBES Lessen, NP  [facility] - [facility] DE[facility] [address] [place] [address] Dept: [phone] Dept Fax: [phone]   Orders Placed This Encounter  Procedures   MM Digital Diagnostic Unilat L    Standing Status:   Future    Expected Date:   12/26/2023    Expiration Date:   12/25/2024    Reason for Exam (SYMPTOM  OR DIAGNOSIS REQUIRED):   new left breast lump. history of stage II breast cancer on the left    Preferred imaging [facility]   US  LIMITED ULTRASOUND INCLUDING AXILLA LEFT BREAST     Standing Status:   Future    Expected Date:   12/26/2023    Expiration Date:   12/25/2024    Reason for Exam (SYMPTOM  OR DIAGNOSIS REQUIRED):   new left breast lump. history of stage II breast cancer on the left    Preferred Imaging Location?:   [facility]  CHIEF COMPLAINT:  CC: Left breast cancer, estrogen receptor positive  Current Treatment: surveillance  INTERVAL HISTORY:  Michelle Kennedy is here today for repeat clinical assessment.  She was last seen by Dr. Glorietta on 09/18/2022.  Her most recent 3D screening mammogram was done 01/24/2023.  There is no mammographic evidence of malignancy.  Expected recall is September 2025.  She completed AI therapy for ductal carcinoma last year.  She found a lump in her left breast 4 weeks ago.  She has  noticed it becoming larger, elongated, and hard.  Gradually changed over the course of the last month. She denies chest pain, chest pressure, or shortness of breath. She denies headaches or visual disturbances. She denies abdominal pain, nausea, vomiting, or changes in bowel or bladder habits.    She denies fevers or chills. She denies pain. Her appetite is good.  I have reviewed the past medical history, past surgical history, social history and family history with the patient and they are unchanged from previous note.  ALLERGIES:  is allergic to bee venom, codeine, contrast media [iodinated contrast media], gadolinium derivatives, latex, lidocaine , penicillins, pentazocine lactate, shellfish allergy, aspirin, erythromycin, symbicort  [budesonide -formoterol  fumarate], compazine  [prochlorperazine  maleate], and sulfonamide derivatives.  MEDICATIONS:  Current Outpatient Medications  Medication Sig Dispense Refill   albuterol  (PROVENTIL ) (2.5 MG/3ML) 0.083% nebulizer solution Take 3 mLs (2.5 mg total) by nebulization every 6 (six) hours as needed for wheezing or shortness of breath. 75 mL 5   amLODipine  (NORVASC ) 5 MG tablet Take 1 tablet (5 mg total) by mouth daily. 90 tablet 1   CALCIUM  PO Take 1 tablet by mouth every evening.     cholecalciferol  (VITAMIN D3) 25 MCG (1000 UNIT) tablet Take 1,000 Units by mouth every evening.      clopidogrel  (PLAVIX ) 75 MG tablet TAKE 1 TABLET BY MOUTH DAILY 90 tablet 3   doxycycline  (VIBRA -TABS) 100 MG tablet Take 1 tablet (100 mg total) by mouth 2 (two) times daily. 14 tablet 0   EPINEPHrine  0.3 mg/0.3 mL IJ SOAJ injection Inject 0.3 mg into the muscle as needed for anaphylaxis. 2 each 1   fluticasone -salmeterol (WIXELA INHUB) 500-50 MCG/ACT AEPB Inhale 1 puff into the lungs in the morning and at bedtime. 60 each 2   furosemide  (LASIX ) 20 MG tablet TAKE 1 TABLET BY MOUTH DAILY AS  NEEDED FOR SWELLING 90 tablet 3   gabapentin  (NEURONTIN ) 300 MG capsule Take 1  capsule (300 mg total) by mouth 3 (three) times daily. (Patient taking differently: Take 300 mg by mouth 2 (two) times daily.) 270 capsule 1   ipratropium-albuterol  (DUONEB) 0.5-2.5 (3) MG/3ML SOLN Take 3 mLs by nebulization every 6 (six) hours. 360 mL 0   latanoprost  (XALATAN ) 0.005 % ophthalmic solution Place 1 drop into both eyes at bedtime.     levothyroxine  (SYNTHROID ) 150 MCG tablet Take 1 tablet (150 mcg total) by mouth daily. 90 tablet 3   lisinopril  (ZESTRIL ) 20 MG tablet Take 2 tablets (40 mg total) by mouth daily. 180 tablet 1   meclizine  (ANTIVERT ) 25 MG tablet Take 1 tablet (25 mg total) by mouth 3 (three) times daily as needed for dizziness. 30 tablet 0   metFORMIN  (GLUCOPHAGE ) 500 MG tablet TAKE 2 TABLETS BY MOUTH IN THE MORNING AND 2 TABLETS IN THE EVENING 120 tablet 0   methocarbamol  (ROBAXIN ) 500 MG tablet Take 500 mg by mouth 4 (four) times daily as needed for muscle spasms.     montelukast  (SINGULAIR ) 10 MG tablet Take 1 tablet (  10 mg total) by mouth at bedtime. 90 tablet 1   Multiple Vitamin (MULTIVITAMIN ADULT) TABS Take 1 tablet by mouth daily.     Omega-3 Fatty Acids (FISH OIL) 1000 MG CAPS Take 1 capsule by mouth every evening.      pantoprazole  (PROTONIX ) 40 MG tablet Take 1 tablet (40 mg total) by mouth daily. 90 tablet 3   rosuvastatin  (CRESTOR ) 5 MG tablet TAKE 1 TABLET BY MOUTH DAILY 90 tablet 0   Semaglutide -Weight Management (WEGOVY ) 0.25 MG/0.5ML SOAJ Inject 0.25 mg into the skin once a week. 2 mL 0   topiramate  (TOPAMAX ) 100 MG tablet Take 1 tablet (100 mg total) by mouth daily with supper. 30 tablet 0   No current facility-administered medications for this visit.    HISTORY OF PRESENT ILLNESS:   Oncology History  Breast cancer of upper-outer quadrant of left female breast (HCC)  12/22/2013 Mammogram   Left breast upper-outer quadrant 1.8 x 1.4 x 1.6 cm mass with left axillary lymph node measuring 3.6 mm   12/30/2013 Breast MRI   Dominant enhancing left upper  outer quadrant irregular mass encompassing a confluent area of abnormal clumped nodular enhancement, 5.5 cm overall, 6 mm masslike enhancement central left breast     02/23/2014 Surgery   Left lumpectomy: Invasive ductal carcinoma grade 2 spending 3.8 cm intermediate grade DCIS with lymphovascular invasion margins negative one out of 4 SLN positive, ER 99%, PR 100%, HER-2 negative ratio 0.74, T2, N1, M0 stage IIB   03/19/2014 PET scan   No evidence of distant metastatic disease   03/31/2014 - 08/18/2014 Chemotherapy   Adjuvant chemotherapy with dose dense Adriamycin  Cytoxan  x4 followed by Abraxane  weekly x12   08/28/2014 Imaging   Left brachial vein thrombus   09/11/2014 -  Radiation Therapy   Adjuvant radiation therapy   11/20/2014 -  Anti-estrogen oral therapy   Anastrozole  1 mg daily + Ibrance  ([name] clinical trial)       REVIEW OF SYSTEMS:   Constitutional: Denies fevers, chills or abnormal weight loss Eyes: Denies blurriness of vision Ears, nose, mouth, throat, and face: Denies mucositis or sore throat Respiratory: Denies cough, dyspnea or wheezes Cardiovascular: Denies palpitation, chest discomfort or lower extremity swelling Gastrointestinal:  Denies nausea, heartburn or change in bowel habits Skin: Denies abnormal skin rashes Lymphatics: Denies new lymphadenopathy or easy bruising Neurological:Denies numbness, tingling or new weaknesses Behavioral/Psych: Mood is stable, no new changes  BREAST: new breast mass noted about 4 weeks ago.  All other systems were reviewed with the patient and are negative.   VITALS:   Today's Vitals   12/26/23 0759  BP: 138/82  Pulse: 77  Resp: 20  Temp: (!) 97.3 F (36.3 C)  SpO2: 98%  Weight: 262 lb 6.4 oz (119 kg)   Body mass index is 41.1 kg/m.   Wt Readings from Last 3 Encounters:  12/26/23 262 lb 6.4 oz (119 kg)  12/10/23 254 lb (115.2 kg)  11/22/23 259 lb 6.4 oz (117.7 kg)    Body mass index is 41.1 kg/m.  Performance  status (ECOG): 1 - Symptomatic but completely ambulatory  PHYSICAL EXAM:  Physical Exam Chest:  Breasts:    Right: Normal.     Left: Mass, skin change and tenderness present. No swelling, inverted nipple or nipple discharge.       Comments: Expected skin changes of the left breast, associated with prior radiation changes.     GENERAL:alert, no distress and comfortable SKIN: skin color, texture, turgor are  normal, no rashes or significant lesions EYES: normal, Conjunctiva are pink and non-injected, sclera clear OROPHARYNX:no exudate, no erythema and lips, buccal mucosa, and tongue normal  NECK: supple, thyroid  normal size, non-tender, without nodularity LYMPH:  no palpable lymphadenopathy in the cervical, axillary or inguinal LUNGS: clear to auscultation and percussion with normal breathing effort HEART: regular rate & rhythm and no murmurs and no lower extremity edema ABDOMEN:abdomen soft, non-tender and normal bowel sounds Musculoskeletal:no cyanosis of digits and no clubbing  NEURO: alert & oriented x 3 with fluent speech, no focal motor/sensory deficits  LABORATORY DATA:  I have reviewed the data as listed    Component Value Date/Time   NA 141 09/28/2023 0520   NA 141 07/06/2022 1034   NA 139 01/23/2017 1147   K 4.2 09/28/2023 0520   K 3.9 01/23/2017 1147   CL 108 09/28/2023 0520   CO2 23 09/28/2023 0520   CO2 26 01/23/2017 1147   GLUCOSE 140 (H) 09/28/2023 0520   GLUCOSE 114 01/23/2017 1147   BUN 13 09/28/2023 0520   BUN 17 07/06/2022 1034   BUN 15.2 01/23/2017 1147   CREATININE 0.62 09/28/2023 0520   CREATININE 0.75 10/28/2021 1531   CREATININE 1.0 01/23/2017 1147   CALCIUM  9.0 09/28/2023 0520   CALCIUM  10.0 01/23/2017 1147   PROT 6.2 (L) 09/28/2023 0520   PROT 6.3 02/21/2023 1515   PROT 6.9 01/23/2017 1147   ALBUMIN  3.4 (L) 09/28/2023 0520   ALBUMIN  4.0 02/21/2023 1515   ALBUMIN  3.8 01/23/2017 1147   AST 19 09/28/2023 0520   AST 24 01/23/2017 1147   ALT 24  09/28/2023 0520   ALT 39 01/23/2017 1147   ALKPHOS 58 09/28/2023 0520   ALKPHOS 72 01/23/2017 1147   BILITOT 0.4 09/28/2023 0520   BILITOT 0.2 02/21/2023 1515   BILITOT 0.41 01/23/2017 1147   GFRNONAA >60 09/28/2023 0520   GFRAA >60 12/30/2019 1235    Lab Results  Component Value Date   WBC 7.4 09/28/2023   NEUTROABS 3.2 08/13/2023   HGB 13.3 09/28/2023   HCT 43.9 09/28/2023   MCV 81.0 09/28/2023   PLT 227 09/28/2023

## 2023-12-25 NOTE — Assessment & Plan Note (Signed)
 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 M0 stage IIB Adjuvant chemotherapy Completed 4 cycles of dose dense Adriamycin  and Cytoxan . Followed by Abraxane  X 12 completed 08/18/14, status post radiation completed June 2016, started anastrozole  1 mg daily 11/20/2014 (also enrolled on [name] clinical trial randomized to Ibrance ) completed 2 years 01/23/2017   Breast Cancer Surveillance: 1. Breast exam: 09/18/2022: Benign 2. Mammogram 12/22/2021: Benign breast density category B 3.  Mammogram 01/24/2023 -benign     Chemo induced peripheral neuropathy Hospitalization: 08/22/2019-08/23/2019 severe headache with right-sided visual disturbance: Stroke work-up negative, started on Plavix  75 mg daily   She has a 20 acres in [place] and is hoping to build a house and retire there at some point in the future.  She had plans to move sometime over the past 1 year but continues to live in [place].  Completed treatment with AI earlier this year.  12/26/2023 -noticed new lump in left breast about 4 weeks ago.  Has become bigger, elongated, and hard harder over the past 4 weeks.

## 2023-12-26 ENCOUNTER — Inpatient Hospital Stay: Attending: Nurse Practitioner | Admitting: Nurse Practitioner

## 2023-12-26 ENCOUNTER — Ambulatory Visit
Admission: RE | Admit: 2023-12-26 | Discharge: 2023-12-26 | Disposition: A | Discharge status: Home / Self Care | Source: Ambulatory Visit | Attending: Nurse Practitioner | Admitting: Nurse Practitioner

## 2023-12-26 ENCOUNTER — Ambulatory Visit
Admission: RE | Admit: 2023-12-26 | Discharge: 2023-12-26 | Disposition: A | Discharge status: Home / Self Care | Source: Ambulatory Visit | Attending: Nurse Practitioner

## 2023-12-26 ENCOUNTER — Encounter: Payer: Self-pay | Admitting: Nurse Practitioner

## 2023-12-26 VITALS — BP 138/82 | HR 77 | Temp 97.3°F | Resp 20 | Wt 262.4 lb

## 2023-12-26 DIAGNOSIS — Z79811 Long term (current) use of aromatase inhibitors: Secondary | ICD-10-CM | POA: Insufficient documentation

## 2023-12-26 DIAGNOSIS — Z170 Estrogen receptor positive status [ER+]: Secondary | ICD-10-CM | POA: Diagnosis not present

## 2023-12-26 DIAGNOSIS — Z1721 Progesterone receptor positive status: Secondary | ICD-10-CM | POA: Insufficient documentation

## 2023-12-26 DIAGNOSIS — N6322 Unspecified lump in the left breast, upper inner quadrant: Secondary | ICD-10-CM

## 2023-12-26 DIAGNOSIS — Z1732 Human epidermal growth factor receptor 2 negative status: Secondary | ICD-10-CM | POA: Insufficient documentation

## 2023-12-26 DIAGNOSIS — C50412 Malignant neoplasm of upper-outer quadrant of left female breast: Secondary | ICD-10-CM | POA: Diagnosis not present

## 2023-12-26 DIAGNOSIS — G620 Drug-induced polyneuropathy: Secondary | ICD-10-CM | POA: Diagnosis not present

## 2023-12-26 DIAGNOSIS — N63 Unspecified lump in unspecified breast: Secondary | ICD-10-CM | POA: Insufficient documentation

## 2023-12-26 DIAGNOSIS — Z923 Personal history of irradiation: Secondary | ICD-10-CM | POA: Diagnosis not present

## 2023-12-26 DIAGNOSIS — T451X5A Adverse effect of antineoplastic and immunosuppressive drugs, initial encounter: Secondary | ICD-10-CM | POA: Diagnosis not present

## 2023-12-27 ENCOUNTER — Ambulatory Visit: Admitting: Bariatrics

## 2023-12-27 ENCOUNTER — Encounter: Payer: Self-pay | Admitting: Bariatrics

## 2023-12-27 VITALS — BP 113/70 | HR 76 | Temp 97.7°F | Ht 66.0 in | Wt 254.0 lb

## 2023-12-27 DIAGNOSIS — Z6841 Body Mass Index (BMI) 40.0 and over, adult: Secondary | ICD-10-CM

## 2023-12-27 DIAGNOSIS — E785 Hyperlipidemia, unspecified: Secondary | ICD-10-CM

## 2023-12-27 DIAGNOSIS — E782 Mixed hyperlipidemia: Secondary | ICD-10-CM

## 2023-12-27 DIAGNOSIS — Z85850 Personal history of malignant neoplasm of thyroid: Secondary | ICD-10-CM

## 2023-12-27 DIAGNOSIS — E66813 Obesity, class 3: Secondary | ICD-10-CM

## 2023-12-27 DIAGNOSIS — R7303 Prediabetes: Secondary | ICD-10-CM

## 2023-12-27 NOTE — Progress Notes (Signed)
 WEIGHT SUMMARY AND BIOMETRICS  Weight Lost Since Last Visit: 2lb  Weight Gained Since Last Visit: 0   Vitals Temp: 97.7 F (36.5 C) BP: 113/70 Pulse Rate: 76 SpO2: 97 %   Anthropometric Measurements Height: 5' 6 (1.676 m) Weight: 254 lb (115.2 kg) BMI (Calculated): 41.02 Weight at Last Visit: 252lb Weight Lost Since Last Visit: 2lb Weight Gained Since Last Visit: 0 Starting Weight: 275lb Total Weight Loss (lbs): 25 lb (11.3 kg)   Body Composition  Body Fat %: 51.2 % Fat Mass (lbs): 130 lbs Muscle Mass (lbs): 117.8 lbs Total Body Water  (lbs): 91.4 lbs Visceral Fat Rating : 18   Other Clinical Data Fasting: yes Labs: yes Today's Visit #: 64 Starting Date: 07/28/21    OBESITY Michelle Kennedy is here to discuss her progress with her obesity treatment plan along with follow-up of her obesity related diagnoses.    Nutrition Plan: the Category 4 plan - 75% adherence.  Current exercise: aerobics and walking  Interim History:  She is down 2 pounds since her last visit.  She has not been as active secondary to knee pain.  She is scheduled for knee replacement at the end of the month.  She is still exercising but not as active. Eating all of the food on the plan., Is not skipping meals, Not journaling consistently., and Water  intake is adequate.   Pharmacotherapy: Michelle Kennedy is on metformin  500 mg 2 tablets in the a.m. and 2 tablets in the PM. Adverse side effects: None Hunger is moderately controlled.  Cravings are moderately controlled.  Assessment/Plan:   Prediabetes Last A1c was 5.6 , but previously in the prediabetic range as seen below. Medication(s): Metformin  500 mg 2 in the AM and 2 in the PM.  Lab Results  Component Value Date   HGBA1C 5.6 06/19/2023   HGBA1C 5.9 02/21/2023   HGBA1C 5.7 (A) 10/03/2022   HGBA1C 5.7 (H) 07/06/2022   HGBA1C 5.7  (A) 02/02/2022   Lab Results  Component Value Date   INSULIN  18.0 07/06/2022   INSULIN  25.6 (H) 07/28/2021   INSULIN  21.8 09/20/2016    Plan: Will minimize all refined carbohydrates both sweets and starches.  Will work on the plan and exercise.  Consider both aerobic and resistance training.  Will keep protein, water , and fiber intake high.  Increase Polyunsaturated and Monounsaturated fats to increase satiety and encourage weight loss.  Aim for 7 to 9 hours of sleep nightly.  Will continue her metformin  at 500 mg 2 in the AM and 2 in the PM. She will call if she needs a medication refill prior to her next appointment.   Hyperlipidemia LDL is not at goal. Medication(s): Crestor , and omega-3 fatty acids over-the-counter. Cardiovascular risk factors: diabetes mellitus, dyslipidemia, obesity (BMI >= 30 kg/m2), and sedentary lifestyle  Lab Results  Component Value Date   CHOL 170 07/06/2022  HDL 73 07/06/2022   LDLCALC 81 07/06/2022   LDLDIRECT 117.0 02/26/2019   TRIG 85 07/06/2022   CHOLHDL 3 06/28/2020   Lab Results  Component Value Date   ALT 24 09/28/2023   AST 19 09/28/2023   ALKPHOS 58 09/28/2023   BILITOT 0.4 09/28/2023   The 10-year ASCVD risk score (Arnett DK, et al., 2019) is: 8%   Values used to calculate the score:     Age: 68 years     Clincally relevant sex: Female     Is Non-Hispanic African American: No     Diabetic: No     Tobacco smoker: No     Systolic Blood Pressure: 113 mmHg     Is BP treated: Yes     HDL Cholesterol: 73 mg/dL     Total Cholesterol: 175 mg/dL  Plan:  Continue statin.  Information sheet on healthy vs unhealthy fats.  Will avoid all trans fats.  Will read labels Will minimize saturated fats except the following: low fat meats in moderation, diary, and limited dark chocolate.  Increase Omega 3 in foods, and consider an Omega 3 supplement.   History of thyroid  cancer:  She is currently on Synthroid  150 mcg daily.  She  denies any signs and symptoms related to hypothyroidism at this time  other than some fatigue.   Plan:  She will continue her Synthroid  at the current dose.  Will check her thyroid  panel today.  Labs done today (CMP, Lipids, HgbA1c, insulin , vitamin D ,  and thyroid  panel).    Morbid Obesity: Current BMI BMI (Calculated): 41.02   Pharmacotherapy Plan Continue metformin  500 mg 2 tablets in the a.m. and 2 tablets in the p.m. she currently has enough medications to last until her next appointment.  Michelle Kennedy is currently in the action stage of change. As such, her goal is to continue with weight loss efforts.  She has agreed to the Category 4 plan.  Exercise goals: Older adults should determine their level of effort for physical activity relative to their level of fitness.  She plans to have a knee replacement at the end of the month and she will follow her surgeons recommendations for exercise until she is cleared.   Behavioral modification strategies: increasing lean protein intake, no meal skipping, decrease eating out, meal planning , increase water  intake, better snacking choices, planning for success, increasing vegetables, decrease snacking , keep healthy foods in the home, weigh protein portions, and measure portion sizes.  Michelle Kennedy has agreed to follow-up with our clinic in 6 weeks.    Objective:   VITALS: Per patient if applicable, see vitals. GENERAL: Alert and in no acute distress. CARDIOPULMONARY: No increased WOB. Speaking in clear sentences.  PSYCH: Pleasant and cooperative. Speech normal rate and rhythm. Affect is appropriate. Insight and judgement are appropriate. Attention is focused, linear, and appropriate.  NEURO: Oriented as arrived to appointment on time with no prompting.   Attestation Statements:   This was prepared with the assistance of Engineer, civil (consulting).  Occasional wrong-word or sound-a-like substitutions may have occurred due to the inherent limitations  of voice recognition   Clayborne Daring, DO

## 2023-12-28 LAB — VITAMIN D 25 HYDROXY (VIT D DEFICIENCY, FRACTURES): Vit D, 25-Hydroxy: 46.2 ng/mL (ref 30.0–100.0)

## 2023-12-28 LAB — HEMOGLOBIN A1C
Est. average glucose Bld gHb Est-mCnc: 117 mg/dL
Hgb A1c MFr Bld: 5.7 % — ABNORMAL HIGH (ref 4.8–5.6)

## 2023-12-28 LAB — COMPREHENSIVE METABOLIC PANEL WITH GFR
ALT: 25 IU/L (ref 0–32)
AST: 21 IU/L (ref 0–40)
Albumin: 4.4 g/dL (ref 3.9–4.9)
Alkaline Phosphatase: 74 IU/L (ref 44–121)
BUN/Creatinine Ratio: 15 (ref 12–28)
BUN: 15 mg/dL (ref 8–27)
Bilirubin Total: 0.4 mg/dL (ref 0.0–1.2)
CO2: 21 mmol/L (ref 20–29)
Calcium: 9.7 mg/dL (ref 8.7–10.3)
Chloride: 107 mmol/L — ABNORMAL HIGH (ref 96–106)
Creatinine, Ser: 0.98 mg/dL (ref 0.57–1.00)
Globulin, Total: 2.2 g/dL (ref 1.5–4.5)
Glucose: 91 mg/dL (ref 70–99)
Potassium: 4.6 mmol/L (ref 3.5–5.2)
Sodium: 145 mmol/L — ABNORMAL HIGH (ref 134–144)
Total Protein: 6.6 g/dL (ref 6.0–8.5)
eGFR: 62 mL/min/1.73 (ref >59)

## 2023-12-28 LAB — LIPID PANEL WITH LDL/HDL RATIO
Cholesterol, Total: 225 mg/dL — ABNORMAL HIGH (ref 100–199)
HDL: 64 mg/dL (ref >39)
LDL Chol Calc (NIH): 130 mg/dL — ABNORMAL HIGH (ref 0–99)
LDL/HDL Ratio: 2 ratio (ref 0.0–3.2)
Triglycerides: 177 mg/dL — ABNORMAL HIGH (ref 0–149)
VLDL Cholesterol Cal: 31 mg/dL (ref 5–40)

## 2023-12-28 LAB — INSULIN, RANDOM: INSULIN: 15.5 u[IU]/mL (ref 2.6–24.9)

## 2023-12-28 LAB — TSH+T4F+T3FREE
Free T4: 1.64 ng/dL (ref 0.82–1.77)
T3, Free: 2.8 pg/mL (ref 2.0–4.4)
TSH: 2.33 u[IU]/mL (ref 0.450–4.500)

## 2023-12-29 ENCOUNTER — Other Ambulatory Visit: Payer: Self-pay | Admitting: Family

## 2024-01-01 ENCOUNTER — Other Ambulatory Visit: Payer: Self-pay | Admitting: Physician Assistant

## 2024-01-01 DIAGNOSIS — Z01818 Encounter for other preprocedural examination: Secondary | ICD-10-CM

## 2024-01-04 ENCOUNTER — Ambulatory Visit: Admitting: Sports Medicine

## 2024-01-07 ENCOUNTER — Telehealth: Payer: Self-pay | Admitting: Pharmacy Technician

## 2024-01-07 NOTE — Telephone Encounter (Signed)
 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    Research labs- No labs drawn this time point.  Anti-cancer medications - Patient states they no longer take any anti-cancer medications to include HRT or AI.   Serious adverse events - Patient denies any new, serious health problems since last visit.    Disease monitoring - Patient completed annual MMG 12/26/23.    COVID-19 - Patient had a COVID-19 booster on 02/12/23.  They have not had any COVID-19 symptoms in the last year.  Patient was reminded about 10 year follow up with optional lab appointment next year. Patient indicated that she may move to [place], [place]. I will look and see if there is a study location near [place] in case we need to transfer the patient to keep her on study. Patient was thanked for her time and asked to call clinic if she had any concerns.  Crystal Keven BS [facility]  Clinical Research Specialist II Oncology Services  Direct Dial: [phone] Fax: [phone]   01/07/2024 3:29 PM

## 2024-01-08 ENCOUNTER — Ambulatory Visit

## 2024-01-12 ENCOUNTER — Other Ambulatory Visit: Payer: Self-pay | Admitting: Family

## 2024-01-14 NOTE — Patient Instructions (Signed)
 SURGICAL WAITING ROOM VISITATION  Patients having surgery or a procedure may have no more than 2 support people in the waiting area - these visitors may rotate.    Children under the age of 98 must have an adult with them who is not the patient.  Visitors with respiratory illnesses are discouraged from visiting and should remain at home.  If the patient needs to stay at the hospital during part of their recovery, the visitor guidelines for inpatient rooms apply. Pre-op nurse will coordinate an appropriate time for 1 support person to accompany patient in pre-op.  This support person may not rotate.    Please refer to the [facility] website for the visitor guidelines for Inpatients (after your surgery is over and you are in a regular room).    Your procedure is scheduled on: 01/18/24   Report to [facility] Main Entrance    Report to admitting at 7:45 AM   Call this number if you have problems the morning of surgery [phone]   Do not eat food :After Midnight.   After Midnight you may have the following liquids until 7:15 AM DAY OF SURGERY  Water  Non-Citrus Juices (without pulp, NO RED-Apple, White grape, White cranberry) Black Coffee (NO MILK/CREAM OR CREAMERS, sugar ok)  Clear Tea (NO MILK/CREAM OR CREAMERS, sugar ok) regular and decaf                             Plain Jell-O (NO RED)                                           Fruit ices (not with fruit pulp, NO RED)                                     Popsicles (NO RED)                                                               Sports drinks like Gatorade (NO RED)     The day of surgery:  Drink ONE (1) Pre-Surgery G2 at 7:15 AM the morning of surgery. Drink in one sitting. Do not sip.  This drink was given to you during your hospital  pre-op appointment visit. Nothing else to drink after completing the  Pre-Surgery G2.          If you have questions, please contact your surgeon's office.   FOLLOW BOWEL PREP  AND ANY ADDITIONAL PRE OP INSTRUCTIONS YOU RECEIVED FROM YOUR SURGEON'S OFFICE!!!     Oral Hygiene is also important to reduce your risk of infection.                                    Remember - BRUSH YOUR TEETH THE MORNING OF SURGERY WITH YOUR REGULAR TOOTHPASTE  DENTURES WILL BE REMOVED PRIOR TO SURGERY PLEASE DO NOT APPLY Poly grip OR ADHESIVES!!!   Stop all vitamins and herbal supplements 7 days before surgery.  Take these medicines the morning of surgery with A SIP OF WATER : Inhalers, Amlodipine , Gabapentin , Levothyroxine , Pantoprazole , Rosuvastatin    DO NOT TAKE ANY ORAL DIABETIC MEDICATIONS DAY OF YOUR SURGERY                              You may not have any metal on your body including hair pins, jewelry, and body piercing             Do not wear make-up, lotions, powders, perfumes, or deodorant  Do not wear nail polish including gel and S&S, artificial/acrylic nails, or any other type of covering on natural nails including finger and toenails. If you have artificial nails, gel coating, etc. that needs to be removed by a nail salon please have this removed prior to surgery or surgery may need to be canceled/ delayed if the surgeon/ anesthesia feels like they are unable to be safely monitored.   Do not shave  48 hours prior to surgery.    Do not bring valuables to the hospital. [facility] IS NOT             RESPONSIBLE   FOR VALUABLES.   Contacts, glasses, dentures or bridgework may not be worn into surgery.   Bring small overnight bag day of surgery.   DO NOT BRING YOUR HOME MEDICATIONS TO THE HOSPITAL. PHARMACY WILL DISPENSE MEDICATIONS LISTED ON YOUR MEDICATION LIST TO YOU DURING YOUR ADMISSION IN THE HOSPITAL!              Please read over the following fact sheets you were given: IF YOU HAVE QUESTIONS ABOUT YOUR PRE-OP INSTRUCTIONS PLEASE CALL [phone].   If you received a COVID test during your pre-op visit  it is requested that you wear a mask when  out in public, stay away from anyone that may not be feeling well and notify your surgeon if you develop symptoms. If you test positive for Covid or have been in contact with anyone that has tested positive in the last 10 days please notify you surgeon.      Pre-operative 5 CHG Bath Instructions   You can play a key role in reducing the risk of infection after surgery. Your skin needs to be as free of germs as possible. You can reduce the number of germs on your skin by washing with CHG (chlorhexidine  gluconate) soap before surgery. CHG is an antiseptic soap that kills germs and continues to kill germs even after washing.   DO NOT use if you have an allergy to chlorhexidine /CHG or antibacterial soaps. If your skin becomes reddened or irritated, stop using the CHG and notify one of our RNs at [phone].   Please shower with the CHG soap starting 4 days before surgery using the following schedule:     Please keep in mind the following:  DO NOT shave, including legs and underarms, starting the day of your first shower.   You may shave your face at any point before/day of surgery.  Place clean sheets on your bed the day you start using CHG soap. Use a clean washcloth (not used since being washed) for each shower. DO NOT sleep with pets once you start using the CHG.   CHG Shower Instructions:  If you choose to wash your hair and private area, wash first with your normal shampoo/soap.  After you use shampoo/soap, rinse your hair and body thoroughly to remove shampoo/soap residue.  Turn the water  OFF and apply about 3 tablespoons (45 ml) of CHG soap to a CLEAN washcloth.  Apply CHG soap ONLY FROM YOUR NECK DOWN TO YOUR TOES (washing for 3-5 minutes)  DO NOT use CHG soap on face, private areas, open wounds, or sores.  Pay special attention to the area where your surgery is being performed.  If you are having back surgery, having someone wash your back for you may be helpful. Wait 2 minutes  after CHG soap is applied, then you may rinse off the CHG soap.  Pat dry with a clean towel  Put on clean clothes/pajamas   If you choose to wear lotion, please use ONLY the CHG-compatible lotions on the back of this paper.     Additional instructions for the day of surgery: DO NOT APPLY any lotions, deodorants, cologne, or perfumes.   Put on clean/comfortable clothes.  Brush your teeth.  Ask your nurse before applying any prescription medications to the skin.      CHG Compatible Lotions   Aveeno Moisturizing lotion  Cetaphil Moisturizing Cream  Cetaphil Moisturizing Lotion  Clairol Herbal Essence Moisturizing Lotion, Dry Skin  Clairol Herbal Essence Moisturizing Lotion, Extra Dry Skin  Clairol Herbal Essence Moisturizing Lotion, Normal Skin  Curel Age Defying Therapeutic Moisturizing Lotion with Alpha Hydroxy  Curel Extreme Care Body Lotion  Curel Soothing Hands Moisturizing Hand Lotion  Curel Therapeutic Moisturizing Cream, Fragrance-Free  Curel Therapeutic Moisturizing Lotion, Fragrance-Free  Curel Therapeutic Moisturizing Lotion, Original Formula  Eucerin Daily Replenishing Lotion  Eucerin Dry Skin Therapy Plus Alpha Hydroxy Crme  Eucerin Dry Skin Therapy Plus Alpha Hydroxy Lotion  Eucerin Original Crme  Eucerin Original Lotion  Eucerin Plus Crme Eucerin Plus Lotion  Eucerin TriLipid Replenishing Lotion  Keri Anti-Bacterial Hand Lotion  Keri Deep Conditioning Original Lotion Dry Skin Formula Softly Scented  Keri Deep Conditioning Original Lotion, Fragrance Free Sensitive Skin Formula  Keri Lotion Fast Absorbing Fragrance Free Sensitive Skin Formula  Keri Lotion Fast Absorbing Softly Scented Dry Skin Formula  Keri Original Lotion  Keri Skin Renewal Lotion Keri Silky Smooth Lotion  Keri Silky Smooth Sensitive Skin Lotion  Nivea Body Creamy Conditioning Oil  Nivea Body Extra Enriched Lotion  Nivea Body Original Lotion  Nivea Body Sheer Moisturizing Lotion Nivea  Crme  Nivea Skin Firming Lotion  NutraDerm 30 Skin Lotion  NutraDerm Skin Lotion  NutraDerm Therapeutic Skin Cream  NutraDerm Therapeutic Skin Lotion  ProShield Protective Hand Cream  Provon moisturizing lotion   View Pre-Surgery Education Videos:  [email or web address]     Incentive Spirometer  An incentive spirometer is a tool that can help keep your lungs clear and active. This tool measures how well you are filling your lungs with each breath. Taking long deep breaths may help reverse or decrease the chance of developing breathing (pulmonary) problems (especially infection) following: A long period of time when you are unable to move or be active. BEFORE THE PROCEDURE  If the spirometer includes an indicator to show your best effort, your nurse or respiratory therapist will set it to a desired goal. If possible, sit up straight or lean slightly forward. Try not to slouch. Hold the incentive spirometer in an upright position. INSTRUCTIONS FOR USE  Sit on the edge of your bed if possible, or sit up as far as you can in bed or on a chair. Hold the incentive spirometer in an upright position. Breathe out normally. Place the mouthpiece in your mouth and seal your lips tightly around  it. Breathe in slowly and as deeply as possible, raising the piston or the ball toward the top of the column. Hold your breath for 3-5 seconds or for as long as possible. Allow the piston or ball to fall to the bottom of the column. Remove the mouthpiece from your mouth and breathe out normally. Rest for a few seconds and repeat Steps 1 through 7 at least 10 times every 1-2 hours when you are awake. Take your time and take a few normal breaths between deep breaths. The spirometer may include an indicator to show your best effort. Use the indicator as a goal to work toward during each repetition. After each set of 10 deep breaths, practice coughing  to be sure your lungs are clear. If you have an incision (the cut made at the time of surgery), support your incision when coughing by placing a pillow or rolled up towels firmly against it. Once you are able to get out of bed, walk around indoors and cough well. You may stop using the incentive spirometer when instructed by your caregiver.  RISKS AND COMPLICATIONS Take your time so you do not get dizzy or light-headed. If you are in pain, you may need to take or ask for pain medication before doing incentive spirometry. It is harder to take a deep breath if you are having pain. AFTER USE Rest and breathe slowly and easily. It can be helpful to keep track of a log of your progress. Your caregiver can provide you with a simple table to help with this. If you are using the spirometer at home, follow these instructions: SEEK MEDICAL CARE IF:  You are having difficultly using the spirometer. You have trouble using the spirometer as often as instructed. Your pain medication is not giving enough relief while using the spirometer. You develop fever of 100.5 F (38.1 C) or higher. SEEK IMMEDIATE MEDICAL CARE IF:  You cough up bloody sputum that had not been present before. You develop fever of 102 F (38.9 C) or greater. You develop worsening pain at or near the incision site. MAKE SURE YOU:  Understand these instructions. Will watch your condition. Will get help right away if you are not doing well or get worse. Document Released: 09/18/2006 Document Revised: 07/31/2011 Document Reviewed: 11/19/2006 Exit[employer] Patient Information 2014 [employer], [place].   ________________________________________________________________________ WHAT IS A BLOOD TRANSFUSION? Blood Transfusion Information  A transfusion is the replacement of blood or some of its parts. Blood is made up of multiple cells which provide different functions. Red blood cells carry oxygen and are used for blood loss replacement. White  blood cells fight against infection. Platelets control bleeding. Plasma helps clot blood. Other blood products are available for specialized needs, such as hemophilia or other clotting disorders. BEFORE THE TRANSFUSION  Who gives blood for transfusions?  Healthy volunteers who are fully evaluated to make sure their blood is safe. This is blood bank blood. Transfusion therapy is the safest it has ever been in the practice of medicine. Before blood is taken from a donor, a complete history is taken to make sure that person has no history of diseases nor engages in risky social behavior (examples are intravenous drug use or sexual activity with multiple partners). The donor's travel history is screened to minimize risk of transmitting infections, such as malaria. The donated blood is tested for signs of infectious diseases, such as HIV and hepatitis. The blood is then tested to be sure it is compatible with you in  order to minimize the chance of a transfusion reaction. If you or a relative donates blood, this is often done in anticipation of surgery and is not appropriate for emergency situations. It takes many days to process the donated blood. RISKS AND COMPLICATIONS Although transfusion therapy is very safe and saves many lives, the main dangers of transfusion include:  Getting an infectious disease. Developing a transfusion reaction. This is an allergic reaction to something in the blood you were given. Every precaution is taken to prevent this. The decision to have a blood transfusion has been considered carefully by your caregiver before blood is given. Blood is not given unless the benefits outweigh the risks. AFTER THE TRANSFUSION Right after receiving a blood transfusion, you will usually feel much better and more energetic. This is especially true if your red blood cells have gotten low (anemic). The transfusion raises the level of the red blood cells which carry oxygen, and this usually causes  an energy increase. The nurse administering the transfusion will monitor you carefully for complications. HOME CARE INSTRUCTIONS  No special instructions are needed after a transfusion. You may find your energy is better. Speak with your caregiver about any limitations on activity for underlying diseases you may have. SEEK MEDICAL CARE IF:  Your condition is not improving after your transfusion. You develop redness or irritation at the intravenous (IV) site. SEEK IMMEDIATE MEDICAL CARE IF:  Any of the following symptoms occur over the next 12 hours: Shaking chills. You have a temperature by mouth above 102 F (38.9 C), not controlled by medicine. Chest, back, or muscle pain. People around you feel you are not acting correctly or are confused. Shortness of breath or difficulty breathing. Dizziness and fainting. You get a rash or develop hives. You have a decrease in urine output. Your urine turns a dark color or changes to pink, red, or brown. Any of the following symptoms occur over the next 10 days: You have a temperature by mouth above 102 F (38.9 C), not controlled by medicine. Shortness of breath. Weakness after normal activity. The white part of the eye turns yellow (jaundice). You have a decrease in the amount of urine or are urinating less often. Your urine turns a dark color or changes to pink, red, or brown. Document Released: 05/05/2000 Document Revised: 07/31/2011 Document Reviewed: 12/23/2007 [facility] Patient Information 2014 [place], [place].  _______________________________________________________________________

## 2024-01-14 NOTE — Progress Notes (Signed)
 Patient will only be taking 4 CHG baths as she was not able to get an appointment prior to 8/26  COVID Vaccine Completed: yes  Date of COVID positive in last 90 days:  PCP - Eleanor Ponto, NP Cardiologist - Vina Gull, MD LOV 05/29/23 Epic  Chest x-ray - 09/27/23 Epic EKG - 09/27/23 Epic Stress Test - N/A ECHO - 08/23/19 Epic Cardiac Cath - n/a Pacemaker/ICD device last checked:N/A Spinal Cord Stimulator: yes, patient has remote. Instructed to bring  Bowel Prep - N/A  Sleep Study - yes CPAP - yes every night   Fasting Blood Sugar - preDM, checks BS once a day and if symptomatic Checks Blood Sugar _____ times a day  Last dose of GLP1 agonist-  N/A GLP1 instructions:  Do not take after     Last dose of SGLT-2 inhibitors-  N/A SGLT-2 instructions:  Do not take after     Blood Thinner Instructions: Plavix , hold 7 days Aspirin Instructions:N/A Last Dose: 01/09/24 0900  Activity level: Can go up a flight of stairs and perform activities of daily living without stopping and without symptoms of chest pain or shortness of breath. Has cane for outside of house   Anesthesia review: CHF, DVT, HTN, OSA, asthma, CVA  Patient denies shortness of breath, fever, cough and chest pain at PAT appointment  Patient verbalized understanding of instructions that were given to them at the PAT appointment. Patient was also instructed that they will need to review over the PAT instructions again at home before surgery.

## 2024-01-15 ENCOUNTER — Other Ambulatory Visit: Payer: Self-pay | Admitting: Bariatrics

## 2024-01-15 ENCOUNTER — Encounter (HOSPITAL_COMMUNITY): Payer: Self-pay

## 2024-01-15 ENCOUNTER — Telehealth: Payer: Self-pay | Admitting: Bariatrics

## 2024-01-15 ENCOUNTER — Other Ambulatory Visit: Payer: Self-pay

## 2024-01-15 ENCOUNTER — Encounter (HOSPITAL_COMMUNITY)
Admission: RE | Admit: 2024-01-15 | Discharge: 2024-01-15 | Disposition: A | Discharge status: Home / Self Care | Source: Ambulatory Visit | Attending: Orthopaedic Surgery | Admitting: Orthopaedic Surgery

## 2024-01-15 VITALS — BP 139/89 | HR 70 | Temp 97.8°F | Resp 20 | Ht 66.5 in | Wt 254.0 lb

## 2024-01-15 DIAGNOSIS — I5032 Chronic diastolic (congestive) heart failure: Secondary | ICD-10-CM | POA: Diagnosis not present

## 2024-01-15 DIAGNOSIS — M549 Dorsalgia, unspecified: Secondary | ICD-10-CM | POA: Diagnosis not present

## 2024-01-15 DIAGNOSIS — Z7902 Long term (current) use of antithrombotics/antiplatelets: Secondary | ICD-10-CM | POA: Insufficient documentation

## 2024-01-15 DIAGNOSIS — E039 Hypothyroidism, unspecified: Secondary | ICD-10-CM | POA: Diagnosis not present

## 2024-01-15 DIAGNOSIS — G4733 Obstructive sleep apnea (adult) (pediatric): Secondary | ICD-10-CM | POA: Insufficient documentation

## 2024-01-15 DIAGNOSIS — G8929 Other chronic pain: Secondary | ICD-10-CM | POA: Diagnosis not present

## 2024-01-15 DIAGNOSIS — M797 Fibromyalgia: Secondary | ICD-10-CM | POA: Insufficient documentation

## 2024-01-15 DIAGNOSIS — I110 Hypertensive heart disease with heart failure: Secondary | ICD-10-CM | POA: Diagnosis not present

## 2024-01-15 DIAGNOSIS — Z6841 Body Mass Index (BMI) 40.0 and over, adult: Secondary | ICD-10-CM | POA: Insufficient documentation

## 2024-01-15 DIAGNOSIS — R7303 Prediabetes: Secondary | ICD-10-CM | POA: Diagnosis not present

## 2024-01-15 DIAGNOSIS — M069 Rheumatoid arthritis, unspecified: Secondary | ICD-10-CM | POA: Diagnosis not present

## 2024-01-15 DIAGNOSIS — Z8673 Personal history of transient ischemic attack (TIA), and cerebral infarction without residual deficits: Secondary | ICD-10-CM | POA: Insufficient documentation

## 2024-01-15 DIAGNOSIS — M1711 Unilateral primary osteoarthritis, right knee: Secondary | ICD-10-CM | POA: Diagnosis not present

## 2024-01-15 DIAGNOSIS — J4489 Other specified chronic obstructive pulmonary disease: Secondary | ICD-10-CM | POA: Insufficient documentation

## 2024-01-15 DIAGNOSIS — K219 Gastro-esophageal reflux disease without esophagitis: Secondary | ICD-10-CM | POA: Insufficient documentation

## 2024-01-15 DIAGNOSIS — Z01818 Encounter for other preprocedural examination: Secondary | ICD-10-CM | POA: Diagnosis present

## 2024-01-15 DIAGNOSIS — F32A Depression, unspecified: Secondary | ICD-10-CM | POA: Diagnosis not present

## 2024-01-15 DIAGNOSIS — E669 Obesity, unspecified: Secondary | ICD-10-CM | POA: Diagnosis not present

## 2024-01-15 DIAGNOSIS — Z01812 Encounter for preprocedural laboratory examination: Secondary | ICD-10-CM | POA: Insufficient documentation

## 2024-01-15 LAB — BASIC METABOLIC PANEL WITH GFR
Anion gap: 9 (ref 5–15)
BUN: 13 mg/dL (ref 8–23)
CO2: 24 mmol/L (ref 22–32)
Calcium: 9.5 mg/dL (ref 8.9–10.3)
Chloride: 106 mmol/L (ref 98–111)
Creatinine, Ser: 0.81 mg/dL (ref 0.44–1.00)
GFR, Estimated: >60 mL/min (ref >60)
Glucose, Bld: 99 mg/dL (ref 70–99)
Potassium: 3.6 mmol/L (ref 3.5–5.1)
Sodium: 139 mmol/L (ref 135–145)

## 2024-01-15 LAB — SURGICAL PCR SCREEN
MRSA, PCR: NEGATIVE
Staphylococcus aureus: NEGATIVE

## 2024-01-15 LAB — CBC
HCT: 44.1 % (ref 36.0–46.0)
Hemoglobin: 13.3 g/dL (ref 12.0–15.0)
MCH: 23.5 pg — ABNORMAL LOW (ref 26.0–34.0)
MCHC: 30.2 g/dL (ref 30.0–36.0)
MCV: 78.1 fL — ABNORMAL LOW (ref 80.0–100.0)
Platelets: 263 K/uL (ref 150–400)
RBC: 5.65 MIL/uL — ABNORMAL HIGH (ref 3.87–5.11)
RDW: 16.5 % — ABNORMAL HIGH (ref 11.5–15.5)
WBC: 5.5 K/uL (ref 4.0–10.5)
nRBC: 0 % (ref 0.0–0.2)

## 2024-01-15 NOTE — Telephone Encounter (Signed)
 Patient stated that she is out of our metformin  due to them coming in every 6 weeks instead of for. Please advise. Last appointment was 12/27/23

## 2024-01-16 ENCOUNTER — Telehealth: Payer: Self-pay | Admitting: Orthopaedic Surgery

## 2024-01-16 ENCOUNTER — Encounter (HOSPITAL_COMMUNITY): Payer: Self-pay

## 2024-01-16 ENCOUNTER — Other Ambulatory Visit: Payer: Self-pay | Admitting: Bariatrics

## 2024-01-16 MED ORDER — METFORMIN HCL 500 MG PO TABS: ORAL_TABLET | ORAL | 0 refills | Status: DC

## 2024-01-16 NOTE — Telephone Encounter (Signed)
 Patient called. She would like to ask some questions about her blood work and surgery.

## 2024-01-16 NOTE — Telephone Encounter (Signed)
 Questions answered.

## 2024-01-16 NOTE — Telephone Encounter (Signed)
 Instead of four weeks

## 2024-01-16 NOTE — Progress Notes (Signed)
 Case: [id number] Date/Time: 01/18/24 1001   Procedure: ARTHROPLASTY, KNEE, TOTAL (Right: Knee)   Anesthesia type: Spinal   Diagnosis: Primary osteoarthritis of right knee [M17.11]   Pre-op diagnosis: osteoarthritis right knee   Location: [facility] ROOM 09 / [facility]   Surgeons: Vernetta Lonni GRADE, MD       DISCUSSION: Michelle Kennedy is a 68 yo female with PMH of HTN, HFpEF, asthma, COPD, OSA (uses CPAP), migraines, hx of CVA (2021), GERD, prediabetes, hypothyroid, fibromyalgia, RA, anxiety, depression, obesity (BMI 40), chronic back pain.  Prior complications from anesthesia include: limited mouth opening per prior anesthesia notes. Family history of anesthesia complication: brother aspirated and passed away   Hx of asthma. Last exacerbation requiring inpatient tx was from 5/8-5/10. Treated with nebs, steroids, mag and improved.   Hx of CVA (retinal artery occlusion). Takes Plavix . Last dose: 01/09/24 0900  VS: BP 139/89   Pulse 70   Temp 36.6 C (Oral)   Resp 20   Ht 5' 6.5 (1.689 m)   Wt 115.2 kg   SpO2 97%   BMI 40.38 kg/m   PROVIDERS: Daryl Setter, NP   LABS: Labs reviewed: Acceptable for surgery. (all labs ordered are listed, but only abnormal results are displayed)  Labs Reviewed  CBC - Abnormal; Notable for the following components:      Result Value   RBC 5.65 (*)    MCV 78.1 (*)    MCH 23.5 (*)    RDW 16.5 (*)    All other components within normal limits  SURGICAL PCR SCREEN  BASIC METABOLIC PANEL WITH GFR  TYPE AND SCREEN     IMAGES:   EKG: Sinus rhythm, rate 88 Low voltage, precordial leads Borderline T abnormalities, anterior leads Baseline wander in lead(s) II  CV:  Echo 08/23/2019:  IMPRESSIONS    1. Left ventricular ejection fraction, by estimation, is 55 to 60%. The left ventricle has normal function. The left ventricle has no regional wall motion abnormalities. There is moderate left ventricular hypertrophy. Left ventricular  diastolic parameters are consistent with Grade I diastolic dysfunction (impaired relaxation). No apical thrombus seen with Definity  contrast.  2. Right ventricular systolic function was not well visualized. The right ventricular size is not well visualized.  3. The mitral valve is grossly normal. No evidence of mitral valve regurgitation.  4. The aortic valve was not well visualized. Aortic valve regurgitation is not visualized. No aortic stenosis is present.  5. The inferior vena cava is normal in size with greater than 50% respiratory variability, suggesting right atrial pressure of 3 mmHg. Past Medical History:  Diagnosis Date   Abnormally small mouth    Achilles tendon disorder, right    Allergy    Anemia    Anxiety    Arthritis    hips and knees   Asthma    daily inhaler, prn inhaler and neb.   Asthma due to environmental allergies    Basal cell carcinoma    nose, s/p mohs.   Breast cancer (HCC) 01/2014   left   Cataract    bilateral - surgery   Chest pain    CHF (congestive heart failure) (HCC)    COPD (chronic obstructive pulmonary disease) (HCC)    Dental bridge present    upper front and lower right   Dental crowns present    x 3   Depression    Esophageal spasm    reports since her chemo and breast surgery  she developed esophageal spasms and  reports this is in the past has casue her 02 to desat in the  70s , denies sycnope in relation to this , does report hx of vertigo as well    Family history of anesthesia complication    twin brother aspirated and died on OR table, per pt.   Fibromyalgia    Gallbladder disease    Gallstones    GERD (gastroesophageal reflux disease)    Glaucoma    H/O blood clots    had blood to  PICC line    History of gastric ulcer    as a teenager   History of seizure age 28   as a reaction to Penicillin - no seizures since   History of stomach ulcers    History of thyroid  cancer    s/p thyroidectomy   HTN (hypertension)     Hyperlipidemia    Hypothyroidism    Joint pain    Left knee injury    Liver disorder    seen when she had her hysterectomy , reports she was told it was   small lesion ; but asymptomatic    Lymphedema    left arm   Migraines    Multiple food allergies    Obesity    OSA (obstructive sleep apnea) 02/16/2016   CPAP- uses nightly   Palpitations    reports no longer experiences   Personal history of chemotherapy 2015   Personal history of radiation therapy 2015   Left   Pneumonia    x 1 years ago   Pre-diabetes    Rheumatoid arthritis (HCC)    Sleep apnea    SOB (shortness of breath)    Swelling of both ankles    Tinnitus    UTI (lower urinary tract infection) 02/17/2014   Vertigo    Vitamin D  deficiency    Wears hearing aid in both ears    hearing loss    Past Surgical History:  Procedure Laterality Date   ABDOMINAL HYSTERECTOMY  2004   complete   ACHILLES TENDON REPAIR Right    APPENDECTOMY  2004   BACK SURGERY  02/09/2023   at [facility]   bilateral eye surgery Bilateral 2022   glaucoma   bladder stimulator  08/29/2022   Medtronic bladder stinulator - with remote   BREAST EXCISIONAL BIOPSY     BREAST LUMPECTOMY Left    2015   BREAST LUMPECTOMY WITH NEEDLE LOCALIZATION AND AXILLARY SENTINEL LYMPH NODE BX Left 02/23/2014   Procedure: BREAST LUMPECTOMY WITH NEEDLE LOCALIZATION AND AXILLARY SENTINEL LYMPH NODE BIOPSY;  Surgeon: Morene Olives, MD;  Location: [facility];  Service: General;  Laterality: Left;   BREAST SURGERY  2011   left breast biposy   CARPAL TUNNEL RELEASE Bilateral    CHOLECYSTECTOMY  1990   COLONOSCOPY W/ POLYPECTOMY  06/2009   DECOMPRESSIVE LUMBAR LAMINECTOMY LEVEL 1 N/A 06/22/2023   Procedure: LUMBAR FOUR/FIVE LAMINECTOMY WITH FACET CYST EXCISION;  Surgeon: Georgina Ozell LABOR, MD;  Location: [facility];  Service: Orthopedics;  Laterality: N/A;   EYE SURGERY Right 2013   exc. warts from underneath eyelid    INCONTINENCE SURGERY  2004   KNEE ARTHROSCOPY Bilateral    x 6 each knee   LIGAMENT REPAIR Right    thumb/wrist   ORIF TOE FRACTURE Right    great toe   PORTACATH PLACEMENT N/A 03/24/2014   Procedure: INSERTION PORT-A-CATH;  Surgeon: Morene Olives, MD;  Location: [facility];  Service:  General;  Laterality: N/A;; removed    THYROIDECTOMY  2000   TONSILLECTOMY AND ADENOIDECTOMY  2000   TOTAL KNEE ARTHROPLASTY Left 12/03/2017   Procedure: LEFT TOTAL KNEE ARTHROPLASTY;  Surgeon: Melodi Lerner, MD;  Location: [facility];  Service: Orthopedics;  Laterality: Left;   TUBAL LIGATION  1981   TUMOR EXCISION     from thoracic spine    MEDICATIONS:  albuterol  (PROVENTIL ) (2.5 MG/3ML) 0.083% nebulizer solution   albuterol  (VENTOLIN  HFA) 108 (90 Base) MCG/ACT inhaler   amLODipine  (NORVASC ) 5 MG tablet   CALCIUM  PO   cholecalciferol  (VITAMIN D3) 25 MCG (1000 UNIT) tablet   clopidogrel  (PLAVIX ) 75 MG tablet   doxycycline  (VIBRA -TABS) 100 MG tablet   EPINEPHrine  0.3 mg/0.3 mL IJ SOAJ injection   fluticasone -salmeterol (WIXELA INHUB) 500-50 MCG/ACT AEPB   furosemide  (LASIX ) 20 MG tablet   gabapentin  (NEURONTIN ) 300 MG capsule   ipratropium-albuterol  (DUONEB) 0.5-2.5 (3) MG/3ML SOLN   latanoprost  (XALATAN ) 0.005 % ophthalmic solution   levothyroxine  (SYNTHROID ) 150 MCG tablet   lisinopril  (ZESTRIL ) 20 MG tablet   meclizine  (ANTIVERT ) 25 MG tablet   metFORMIN  (GLUCOPHAGE ) 500 MG tablet   methocarbamol  (ROBAXIN ) 500 MG tablet   montelukast  (SINGULAIR ) 10 MG tablet   Multiple Vitamin (MULTIVITAMIN ADULT) TABS   NON FORMULARY   Omega-3 Fatty Acids (FISH OIL) 1000 MG CAPS   pantoprazole  (PROTONIX ) 40 MG tablet   rosuvastatin  (CRESTOR ) 5 MG tablet   traMADol  (ULTRAM ) 50 MG tablet   No current facility-administered medications for this encounter.   Burnard CHRISTELLA Odis DEVONNA [facility] Short Stay/Anesthesiology [facility] Phone [phone] 01/16/2024 9:26 AM

## 2024-01-17 ENCOUNTER — Encounter: Payer: Self-pay | Admitting: *Deleted

## 2024-01-17 ENCOUNTER — Telehealth: Payer: Self-pay | Admitting: *Deleted

## 2024-01-17 NOTE — Care Plan (Signed)
 [facility] RNCM call to patient to discuss her upcoming Right total knee arthroplasty with Dr. Vernetta on 01/18/24 at [facility]. She is agreeable to case management. She lives with her spouse, who will be able to assist her after discharge home. She has a RW, 3in1/BSC, shower chair. No other DME needs. Anticipate HHPT will be needed after short hospital stay. Referral made to [facility] after choice provided. Reviewed all post op care instructions. Will continue to follow for needs.

## 2024-01-17 NOTE — Anesthesia Preprocedure Evaluation (Signed)
 Anesthesia Evaluation  Patient identified by MRN, date of birth, ID band Patient awake    Reviewed: Allergy & Precautions, NPO status , Patient's Chart, lab work & pertinent test results  History of Anesthesia Complications Negative for: history of anesthetic complications  Airway Mallampati: III  TM Distance: <3 FB Neck ROM: Full  Mouth opening: Limited Mouth Opening  Dental no notable dental hx.    Pulmonary sleep apnea , COPD,  COPD inhaler   Pulmonary exam normal        Cardiovascular hypertension, Pt. on medications +CHF  Normal cardiovascular exam     Neuro/Psych  Headaches  Anxiety Depression       GI/Hepatic Neg liver ROS,GERD  Medicated,,  Endo/Other  Hypothyroidism  Class 3 obesity  Renal/GU negative Renal ROS  negative genitourinary   Musculoskeletal  (+) Arthritis ,  Fibromyalgia -  Abdominal   Peds  Hematology negative hematology ROS (+)   Anesthesia Other Findings Day of surgery medications reviewed with patient.  Reproductive/Obstetrics negative OB ROS                              Anesthesia Physical Anesthesia Plan  ASA: 3  Anesthesia Plan: Spinal   Post-op Pain Management: Regional block* and Tylenol  PO (pre-op)*   Induction:   PONV Risk Score and Plan: 3 and Treatment may vary due to age or medical condition, Ondansetron , Propofol  infusion, Dexamethasone  and Midazolam   Airway Management Planned: Natural Airway and Simple Face Mask  Additional Equipment: None  Intra-op Plan:   Post-operative Plan:   Informed Consent: I have reviewed the patients History and Physical, chart, labs and discussed the procedure including the risks, benefits and alternatives for the proposed anesthesia with the patient or authorized representative who has indicated his/her understanding and acceptance.       Plan Discussed with: CRNA  Anesthesia Plan Comments: (Allergy  listed to lidocaine  but has received this IV as well as regional anesthesia with bupivacaine  and ropivacaine  in the past without incident. Discussed with patient and she states she had low BP that didn't require any intervention during a back pain injection but denies any airway swelling, SOB, rash or any other allergic symptoms. Plan to use lidocaine  and bupivacaine  during this anesthetic. Lawence, MD)         Anesthesia Quick Evaluation

## 2024-01-17 NOTE — H&P (Signed)
 TOTAL KNEE ADMISSION H&P  Patient is being admitted for right total knee arthroplasty.  Subjective:  Chief Complaint:right knee pain.  HPI: Michelle Kennedy, 68 y.o. female, has a history of pain and functional disability in the right knee due to arthritis and has failed non-surgical conservative treatments for greater than 12 weeks to includecorticosteriod injections, viscosupplementation injections, flexibility and strengthening excercises, use of assistive devices, weight reduction as appropriate, and activity modification.  Onset of symptoms was gradual, starting several years ago with gradually worsening course since that time. The patient noted no past surgery on the right knee(s).  Patient currently rates pain in the right knee(s) at 10 out of 10 with activity. Patient has night pain, worsening of pain with activity and weight bearing, pain that interferes with activities of daily living, pain with passive range of motion, crepitus, and joint swelling.  Patient has evidence of subchondral cysts, subchondral sclerosis, periarticular osteophytes, and joint space narrowing by imaging studies. There is no active infection.  Patient Active Problem List   Diagnosis Date Noted   Asthma exacerbation 09/28/2023   Acute asthma exacerbation 09/27/2023   Chronic pain syndrome 09/27/2023   Hypothyroidism 09/27/2023   History of CVA (cerebrovascular accident) 2017 09/27/2023   History of DVT (deep vein thrombosis) of right upper extremity 2015 09/27/2023   Status post laminectomy 06/22/2023   Cyst of lumbar facet joint 06/22/2023   Pre-op evaluation 06/05/2023   Status post insertion of nerve stimulator 03/19/2023   History of thyroid  cancer    SOB (shortness of breath) on exertion 10/03/2022   Polyphagia 04/27/2022   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   Abdominal pannus 03/02/2022   Vertigo 02/07/2022   Peroneal tendinitis of left lower extremity 02/07/2022   Visceral obesity 02/01/2022    Sacral pain 09/20/2021   Pain in right knee 09/13/2021   Chronic left-sided low back pain with left-sided sciatica 04/27/2021   Nondisplaced fracture of middle phalanx of left lesser toe(s), initial encounter for closed fracture 09/17/2020   Chronic diastolic CHF (congestive heart failure) (HCC) 08/23/2019   Retinal artery occlusion 08/22/2019   History of total left knee replacement 12/07/2017   Unilateral primary osteoarthritis, right knee 12/03/2017   Morbid obesity (HCC) 01/01/2017   Essential hypertension 10/18/2016   Constipation 10/04/2016   Vitamin D  deficiency 10/04/2016   Prediabetes 10/04/2016   BMI 39.0-39.9,adult 09/20/2016   OSA on CPAP 02/16/2016   Malignant neoplasm of thyroid  gland (HCC) 10/11/2015   De Quervain's tenosynovitis, bilateral 09/15/2015   Trigger finger, acquired 06/22/2015   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/10/2015   Postmenopausal osteoporosis 12/08/2014   Preventative health care 12/01/2014   DVT (deep venous thrombosis) (HCC) 08/28/2014   Lymphedema of arm 08/28/2014   Mucositis due to chemotherapy 06/02/2014   Menopausal syndrome (hot flashes) 12/31/2013   Breast cancer of upper-outer quadrant of left female breast (HCC) 12/26/2013   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   Basal cell carcinoma    nose, s/p mohs.   Breast cancer (HCC) 01/2014   left   Cataract    bilateral - surgery   CHF (congestive heart failure)  (HCC)    COPD (chronic obstructive pulmonary disease) (HCC)    Dental bridge present    upper front and lower right   Dental crowns present    x 3   Depression    Esophageal spasm    reports since her chemo and breast surgery  she developed esophageal spasms and reports this is in the past has casue her 02 to desat in the  70s , denies sycnope in relation to this , does report hx of vertigo as well    Family history of anesthesia complication    twin brother aspirated and died on OR table, per pt.   Fibromyalgia    Gallbladder disease    Gallstones    GERD (gastroesophageal reflux disease)    Glaucoma    H/O blood clots    had blood to  PICC line    History of gastric ulcer    as a teenager   History of seizure age 89   as a reaction to Penicillin - no seizures since   History of thyroid  cancer    s/p thyroidectomy   HTN (hypertension)    Hyperlipidemia    Hypothyroidism    Joint pain    Liver disorder    seen when she had her hysterectomy , reports she was told it was   small lesion ; but asymptomatic    Lymphedema    left arm   Migraines    Multiple food allergies    Obesity    OSA (obstructive sleep apnea) 02/16/2016   CPAP- uses nightly   Palpitations    reports no longer experiences   Personal history of chemotherapy 2015   Personal history of radiation therapy 2015   Left   Pneumonia    x 1 years ago   Pre-diabetes    Rheumatoid arthritis (HCC)    SOB (shortness of breath)    Swelling of both ankles    Tinnitus    UTI (lower urinary tract infection) 02/17/2014   Vertigo    Vitamin D  deficiency    Wears hearing aid in both ears    hearing loss    Past Surgical History:  Procedure Laterality Date   ABDOMINAL HYSTERECTOMY  2004   complete   ACHILLES TENDON REPAIR Right    APPENDECTOMY  2004   BACK SURGERY  02/09/2023   at the [facility]   bilateral eye surgery Bilateral 2022   glaucoma   bladder stimulator  08/29/2022   Medtronic  bladder stinulator - with remote   BREAST EXCISIONAL BIOPSY     BREAST LUMPECTOMY Left    2015   BREAST LUMPECTOMY WITH NEEDLE LOCALIZATION AND AXILLARY SENTINEL LYMPH NODE BX Left 02/23/2014   Procedure: BREAST LUMPECTOMY WITH NEEDLE LOCALIZATION AND AXILLARY SENTINEL LYMPH NODE BIOPSY;  Surgeon: Morene Olives, MD;  Location: [facility];  Service: General;  Laterality: Left;   BREAST SURGERY  2011   left breast biposy   CARPAL TUNNEL RELEASE Bilateral    CHOLECYSTECTOMY  1990   COLONOSCOPY W/ POLYPECTOMY  06/2009   DECOMPRESSIVE LUMBAR LAMINECTOMY LEVEL 1 N/A 06/22/2023   Procedure: LUMBAR FOUR/FIVE LAMINECTOMY WITH FACET CYST EXCISION;  Surgeon: Georgina Ozell LABOR, MD;  Location: [facility];  Service: Orthopedics;  Laterality: N/A;   EYE SURGERY Right 2013   exc. warts from underneath eyelid   INCONTINENCE SURGERY  2004   KNEE ARTHROSCOPY Bilateral    x 6 each knee   LIGAMENT REPAIR Right    thumb/wrist   ORIF TOE FRACTURE Right    great toe   PORTACATH PLACEMENT N/A 03/24/2014   Procedure: INSERTION PORT-A-CATH;  Surgeon: Morene Olives, MD;  Location: [facility];  Service: General;  Laterality: N/A;; removed    THYROIDECTOMY  2000   TONSILLECTOMY AND ADENOIDECTOMY  2000   TOTAL KNEE ARTHROPLASTY Left 12/03/2017   Procedure: LEFT TOTAL KNEE ARTHROPLASTY;  Surgeon: Melodi Lerner, MD;  Location: [facility];  Service: Orthopedics;  Laterality: Left;   TUBAL LIGATION  1981   TUMOR EXCISION     from thoracic spine    No current facility-administered medications for this encounter.   Current Outpatient Medications  Medication Sig Dispense Refill Last Dose/Taking   albuterol  (PROVENTIL ) (2.5 MG/3ML) 0.083% nebulizer solution Take 3 mLs (2.5 mg total) by nebulization every 6 (six) hours as needed for wheezing or shortness of breath. 75 mL 5 Taking As Needed   albuterol  (VENTOLIN  HFA) 108 (90 Base) MCG/ACT inhaler Inhale 2 puffs into the lungs every 6 (six)  hours as needed for wheezing or shortness of breath.   Taking As Needed   amLODipine  (NORVASC ) 5 MG tablet Take 1 tablet (5 mg total) by mouth daily. 90 tablet 1 Taking   CALCIUM  PO Take 1 tablet by mouth every evening.   Taking   cholecalciferol  (VITAMIN D3) 25 MCG (1000 UNIT) tablet Take 1,000 Units by mouth every evening.    Taking   EPINEPHrine  0.3 mg/0.3 mL IJ SOAJ injection Inject 0.3 mg into the muscle as needed for anaphylaxis. 2 each 1 Taking As Needed   fluticasone -salmeterol (WIXELA INHUB) 500-50 MCG/ACT AEPB Inhale 1 puff into the lungs in the morning and at bedtime. 60 each 2 Taking   furosemide  (LASIX ) 20 MG tablet TAKE 1 TABLET BY MOUTH DAILY AS  NEEDED FOR SWELLING (Patient taking differently: TAKE 1 TABLET BY MOUTH DAILY AS  NEEDED FOR SWELLING) 90 tablet 3 Taking Differently   gabapentin  (NEURONTIN ) 300 MG capsule Take 1 capsule (300 mg total) by mouth 3 (three) times daily. (Patient taking differently: Take 300 mg by mouth 2 (two) times daily.) 270 capsule 1 Taking Differently   latanoprost  (XALATAN ) 0.005 % ophthalmic solution Place 1 drop into both eyes at bedtime.   Taking   levothyroxine  (SYNTHROID ) 150 MCG tablet Take 1 tablet (150 mcg total) by mouth daily. 90 tablet 3 Taking   lisinopril  (ZESTRIL ) 20 MG tablet TAKE 2 TABLETS BY MOUTH DAILY 180 tablet 3 Taking   meclizine  (ANTIVERT ) 25 MG tablet Take 1 tablet (25 mg total) by mouth 3 (three) times daily as needed for dizziness. 30 tablet 0 Taking As Needed   methocarbamol  (ROBAXIN ) 500 MG tablet Take 500 mg by mouth 4 (four) times daily as needed for muscle spasms.   Taking As Needed   montelukast  (SINGULAIR ) 10 MG tablet TAKE 1 TABLET BY MOUTH AT  BEDTIME (Patient taking differently: Take 10 mg by mouth in the morning.) 90 tablet 3 Taking Differently   Multiple Vitamin (MULTIVITAMIN ADULT) TABS Take 1 tablet by mouth daily.   Taking   NON FORMULARY Pt uses a c-pap nightly   Taking   Omega-3 Fatty Acids (FISH OIL) 1000 MG  CAPS Take 1,000 mg by mouth every evening.   Taking  pantoprazole  (PROTONIX ) 40 MG tablet Take 1 tablet (40 mg total) by mouth daily. 90 tablet 3 Taking   rosuvastatin  (CRESTOR ) 5 MG tablet TAKE 1 TABLET BY MOUTH DAILY (Patient taking differently: Take 5 mg by mouth at bedtime.) 90 tablet 0 Taking Differently   traMADol  (ULTRAM ) 50 MG tablet Take 50 mg by mouth every 6 (six) hours as needed for moderate pain (pain score 4-6).   Taking As Needed   clopidogrel  (PLAVIX ) 75 MG tablet TAKE 1 TABLET BY MOUTH DAILY 90 tablet 3    doxycycline  (VIBRA -TABS) 100 MG tablet Take 1 tablet (100 mg total) by mouth 2 (two) times daily. (Patient not taking: Reported on 01/14/2024) 14 tablet 0 Not Taking   ipratropium-albuterol  (DUONEB) 0.5-2.5 (3) MG/3ML SOLN Take 3 mLs by nebulization every 6 (six) hours. (Patient not taking: Reported on 01/14/2024) 360 mL 0 Not Taking   metFORMIN  (GLUCOPHAGE ) 500 MG tablet TAKE 2 TABLETS BY MOUTH IN THE MORNING AND 2 TABLETS IN THE EVENING 120 tablet 0    Allergies  Allergen Reactions   Bee Venom Anaphylaxis   Codeine Shortness Of Breath and Rash   Contrast Media [Iodinated Contrast Media] Shortness Of Breath   Gadolinium Derivatives Hives, Rash, Shortness Of Breath and Swelling    my heart stopped beating    Latex Anaphylaxis and Rash   Lidocaine  Shortness Of Breath and Swelling    SWELLING OF MOUTH AND THROAT, low BP   Penicillins Shortness Of Breath, Rash and Other (See Comments)    SEIZURE   Pentazocine Lactate Shortness Of Breath and Rash   Shellfish Allergy Shortness Of Breath and Rash   Aspirin Rash and Other (See Comments)    GI UPSET   Erythromycin Swelling and Rash    SWELLING OF JOINTS   Symbicort  [Budesonide -Formoterol  Fumarate] Other (See Comments)    BURNING OF TONGUE AND LIPS   Compazine  [Prochlorperazine  Maleate] Rash    Rash on face,chest, arms, back   Sulfonamide Derivatives Rash    Social History   Tobacco Use   Smoking status: Never    Smokeless tobacco: Never  Substance Use Topics   Alcohol use: No    Alcohol/week: 0.0 standard drinks of alcohol    Family History  Problem Relation Age of Onset   Alcohol abuse Mother    Arthritis Mother    Hypertension Mother    Bipolar disorder Mother    Breast cancer Mother 17       unconfirmed   Lung cancer Mother 54       smoker   Hyperlipidemia Mother    Stroke Mother    Cancer Mother    Depression Mother    Anxiety disorder Mother    Alcoholism Mother    Drug abuse Mother    Eating disorder Mother    Obesity Mother    Alcohol abuse Father    Hyperlipidemia Father    Kidney disease Father    Diabetes Father    Hypertension Father    Cystic kidney disease Father    Thyroid  disease Father    Liver disease Father    Alcoholism Father    Lung cancer Father    Thyroid  cancer Sister 22       type?; currently 7   Other Sister        ovarian tumor @ 1; TAH/BSO   Breast cancer Sister    Thyroid  cancer Brother        dx 30s; currently 39   Breast cancer Maternal  Aunt        dx 26s; deceased 15   Thyroid  cancer Paternal Aunt        All 3 paternal aunts with thyroid  ca in 30s/40s   Lung cancer Paternal Aunt        2 of 3 paternal aunts with lung cancer   Arthritis Maternal Grandmother    Diabetes Paternal Grandmother    Heart disease Other    COPD Other    Asthma Other      Review of Systems  Objective:  Physical Exam Vitals reviewed.  Constitutional:      Appearance: Normal appearance. She is obese.  HENT:     Head: Normocephalic and atraumatic.  Eyes:     Extraocular Movements: Extraocular movements intact.     Pupils: Pupils are equal, round, and reactive to light.  Cardiovascular:     Rate and Rhythm: Normal rate and regular rhythm.  Pulmonary:     Effort: Pulmonary effort is normal.     Breath sounds: Normal breath sounds.  Abdominal:     Palpations: Abdomen is soft.  Musculoskeletal:     Cervical back: Normal range of motion and neck  supple.     Right knee: Effusion, bony tenderness and crepitus present. Decreased range of motion. Tenderness present over the medial joint line and lateral joint line. Abnormal alignment and abnormal meniscus.  Neurological:     Mental Status: She is alert and oriented to person, place, and time.  Psychiatric:        Behavior: Behavior normal.     Vital signs in last 24 hours:    Labs:   Estimated body mass index is 40.38 kg/m as calculated from the following:   Height as of 01/15/24: 5' 6.5 (1.689 m).   Weight as of 01/15/24: 115.2 kg.   Imaging Review Plain radiographs demonstrate severe degenerative joint disease of the right knee(s). The overall alignment ismild varus. The bone quality appears to be good for age and reported activity level.      Assessment/Plan:  End stage arthritis, right knee   The patient history, physical examination, clinical judgment of the provider and imaging studies are consistent with end stage degenerative joint disease of the right knee(s) and total knee arthroplasty is deemed medically necessary. The treatment options including medical management, injection therapy arthroscopy and arthroplasty were discussed at length. The risks and benefits of total knee arthroplasty were presented and reviewed. The risks due to aseptic loosening, infection, stiffness, patella tracking problems, thromboembolic complications and other imponderables were discussed. The patient acknowledged the explanation, agreed to proceed with the plan and consent was signed. Patient is being admitted for inpatient treatment for surgery, pain control, PT, OT, prophylactic antibiotics, VTE prophylaxis, progressive ambulation and ADL's and discharge planning. The patient is planning to be discharged home with home health services

## 2024-01-17 NOTE — Telephone Encounter (Signed)
 [facility] RNCM pre-op call to discuss Right TKR on 01/18/24.

## 2024-01-18 ENCOUNTER — Observation Stay (HOSPITAL_COMMUNITY)

## 2024-01-18 ENCOUNTER — Observation Stay (HOSPITAL_COMMUNITY)
Admission: RE | Admit: 2024-01-18 | Discharge: 2024-01-19 | Disposition: A | Discharge status: Home / Self Care | Attending: Orthopaedic Surgery | Admitting: Orthopaedic Surgery

## 2024-01-18 ENCOUNTER — Other Ambulatory Visit: Payer: Self-pay

## 2024-01-18 ENCOUNTER — Encounter (HOSPITAL_COMMUNITY): Payer: Self-pay | Admitting: Medical

## 2024-01-18 ENCOUNTER — Encounter (HOSPITAL_COMMUNITY)
Admission: RE | Disposition: A | Discharge status: Home / Self Care | Payer: Self-pay | Source: Home / Self Care | Attending: Orthopaedic Surgery

## 2024-01-18 ENCOUNTER — Encounter (HOSPITAL_COMMUNITY): Payer: Self-pay | Admitting: Orthopaedic Surgery

## 2024-01-18 ENCOUNTER — Ambulatory Visit (HOSPITAL_BASED_OUTPATIENT_CLINIC_OR_DEPARTMENT_OTHER): Payer: Self-pay | Admitting: Anesthesiology

## 2024-01-18 DIAGNOSIS — E039 Hypothyroidism, unspecified: Secondary | ICD-10-CM | POA: Diagnosis not present

## 2024-01-18 DIAGNOSIS — Z853 Personal history of malignant neoplasm of breast: Secondary | ICD-10-CM | POA: Diagnosis not present

## 2024-01-18 DIAGNOSIS — Z8673 Personal history of transient ischemic attack (TIA), and cerebral infarction without residual deficits: Secondary | ICD-10-CM | POA: Insufficient documentation

## 2024-01-18 DIAGNOSIS — J45909 Unspecified asthma, uncomplicated: Secondary | ICD-10-CM | POA: Insufficient documentation

## 2024-01-18 DIAGNOSIS — J449 Chronic obstructive pulmonary disease, unspecified: Secondary | ICD-10-CM

## 2024-01-18 DIAGNOSIS — M1711 Unilateral primary osteoarthritis, right knee: Secondary | ICD-10-CM

## 2024-01-18 DIAGNOSIS — I5032 Chronic diastolic (congestive) heart failure: Secondary | ICD-10-CM

## 2024-01-18 DIAGNOSIS — I110 Hypertensive heart disease with heart failure: Secondary | ICD-10-CM | POA: Insufficient documentation

## 2024-01-18 DIAGNOSIS — Z96651 Presence of right artificial knee joint: Secondary | ICD-10-CM | POA: Insufficient documentation

## 2024-01-18 DIAGNOSIS — E66813 Obesity, class 3: Secondary | ICD-10-CM | POA: Insufficient documentation

## 2024-01-18 DIAGNOSIS — Z7984 Long term (current) use of oral hypoglycemic drugs: Secondary | ICD-10-CM | POA: Diagnosis not present

## 2024-01-18 DIAGNOSIS — Z91040 Latex allergy status: Secondary | ICD-10-CM | POA: Diagnosis not present

## 2024-01-18 DIAGNOSIS — Z79899 Other long term (current) drug therapy: Secondary | ICD-10-CM | POA: Diagnosis not present

## 2024-01-18 DIAGNOSIS — M25561 Pain in right knee: Secondary | ICD-10-CM | POA: Diagnosis present

## 2024-01-18 DIAGNOSIS — Z85828 Personal history of other malignant neoplasm of skin: Secondary | ICD-10-CM | POA: Diagnosis not present

## 2024-01-18 DIAGNOSIS — Z6841 Body Mass Index (BMI) 40.0 and over, adult: Secondary | ICD-10-CM | POA: Diagnosis not present

## 2024-01-18 HISTORY — PX: TOTAL KNEE ARTHROPLASTY: SHX125

## 2024-01-18 LAB — GLUCOSE, CAPILLARY: Glucose-Capillary: 108 mg/dL — ABNORMAL HIGH (ref 70–99)

## 2024-01-18 LAB — TYPE AND SCREEN
ABO/RH(D): O POS
Antibody Screen: NEGATIVE

## 2024-01-18 SURGERY — ARTHROPLASTY, KNEE, TOTAL
Anesthesia: Spinal | Site: Knee | Laterality: Right

## 2024-01-18 MED ORDER — ACETAMINOPHEN 325 MG PO TABS: 325.0000 mg | ORAL_TABLET | Freq: Four times a day (QID) | ORAL | Status: DC | PRN

## 2024-01-18 MED ORDER — CEFAZOLIN SODIUM-DEXTROSE 2-4 GM/100ML-% IV SOLN
2.0000 g | Freq: Four times a day (QID) | INTRAVENOUS | Status: AC
  Administered 2024-01-18 (×2): 2 g via INTRAVENOUS
  Filled 2024-01-18 (×2): qty 100

## 2024-01-18 MED ORDER — PHENOL 1.4 % MT LIQD: 1.0000 | OROMUCOSAL | Status: DC | PRN

## 2024-01-18 MED ORDER — PANTOPRAZOLE SODIUM 40 MG PO TBEC
40.0000 mg | DELAYED_RELEASE_TABLET | Freq: Every day | ORAL | Status: DC
  Administered 2024-01-19: 40 mg via ORAL
  Filled 2024-01-18: qty 1

## 2024-01-18 MED ORDER — CEFAZOLIN SODIUM-DEXTROSE 2-4 GM/100ML-% IV SOLN
2.0000 g | INTRAVENOUS | Status: AC
  Administered 2024-01-18: 2 g via INTRAVENOUS
  Filled 2024-01-18: qty 100

## 2024-01-18 MED ORDER — SODIUM CHLORIDE 0.9 % IR SOLN
Status: DC | PRN
  Administered 2024-01-18: 1000 mL

## 2024-01-18 MED ORDER — GABAPENTIN 300 MG PO CAPS
300.0000 mg | ORAL_CAPSULE | Freq: Two times a day (BID) | ORAL | Status: DC
  Administered 2024-01-18 – 2024-01-19 (×3): 300 mg via ORAL
  Filled 2024-01-18 (×3): qty 1

## 2024-01-18 MED ORDER — MECLIZINE HCL 25 MG PO TABS: 25.0000 mg | ORAL_TABLET | Freq: Three times a day (TID) | ORAL | Status: DC | PRN

## 2024-01-18 MED ORDER — MIDAZOLAM HCL 2 MG/2ML IJ SOLN
INTRAMUSCULAR | Status: AC
  Filled 2024-01-18: qty 2

## 2024-01-18 MED ORDER — CHLORHEXIDINE GLUCONATE 0.12 % MT SOLN
15.0000 mL | Freq: Once | OROMUCOSAL | Status: AC
  Administered 2024-01-18: 15 mL via OROMUCOSAL

## 2024-01-18 MED ORDER — FLUTICASONE FUROATE-VILANTEROL 200-25 MCG/ACT IN AEPB
1.0000 | INHALATION_SPRAY | Freq: Every day | RESPIRATORY_TRACT | Status: DC
  Filled 2024-01-18: qty 28

## 2024-01-18 MED ORDER — DEXAMETHASONE SODIUM PHOSPHATE 10 MG/ML IJ SOLN
INTRAMUSCULAR | Status: DC | PRN
Start: 2024-01-18 — End: 2024-01-18
  Administered 2024-01-18: 8 mg via INTRAVENOUS

## 2024-01-18 MED ORDER — PHENYLEPHRINE 80 MCG/ML (10ML) SYRINGE FOR IV PUSH (FOR BLOOD PRESSURE SUPPORT)
PREFILLED_SYRINGE | INTRAVENOUS | Status: DC | PRN
  Administered 2024-01-18: 80 ug via INTRAVENOUS

## 2024-01-18 MED ORDER — MONTELUKAST SODIUM 10 MG PO TABS
10.0000 mg | ORAL_TABLET | Freq: Every day | ORAL | Status: DC
  Administered 2024-01-19: 10 mg via ORAL
  Filled 2024-01-18: qty 1

## 2024-01-18 MED ORDER — FENTANYL CITRATE PF 50 MCG/ML IJ SOSY
50.0000 ug | PREFILLED_SYRINGE | Freq: Once | INTRAMUSCULAR | Status: AC
  Administered 2024-01-18: 50 ug via INTRAVENOUS
  Filled 2024-01-18: qty 2

## 2024-01-18 MED ORDER — VITAMIN D 25 MCG (1000 UNIT) PO TABS
1000.0000 [IU] | ORAL_TABLET | Freq: Every evening | ORAL | Status: DC
  Filled 2024-01-18: qty 1

## 2024-01-18 MED ORDER — ALBUTEROL SULFATE (2.5 MG/3ML) 0.083% IN NEBU: 2.5000 mg | INHALATION_SOLUTION | Freq: Four times a day (QID) | RESPIRATORY_TRACT | Status: DC | PRN

## 2024-01-18 MED ORDER — PROPOFOL 1000 MG/100ML IV EMUL
INTRAVENOUS | Status: AC
  Filled 2024-01-18: qty 100

## 2024-01-18 MED ORDER — APIXABAN 2.5 MG PO TABS
2.5000 mg | ORAL_TABLET | Freq: Two times a day (BID) | ORAL | Status: DC
  Administered 2024-01-19: 2.5 mg via ORAL
  Filled 2024-01-18: qty 1

## 2024-01-18 MED ORDER — BUPIVACAINE IN DEXTROSE 0.75-8.25 % IT SOLN
INTRATHECAL | Status: DC | PRN
  Administered 2024-01-18: 1.6 mL via INTRATHECAL

## 2024-01-18 MED ORDER — ONDANSETRON HCL 4 MG PO TABS: 4.0000 mg | ORAL_TABLET | Freq: Four times a day (QID) | ORAL | Status: DC | PRN

## 2024-01-18 MED ORDER — LEVOTHYROXINE SODIUM 50 MCG PO TABS
150.0000 ug | ORAL_TABLET | Freq: Every day | ORAL | Status: DC
  Administered 2024-01-19: 150 ug via ORAL
  Filled 2024-01-18: qty 3

## 2024-01-18 MED ORDER — LACTATED RINGERS IV SOLN: INTRAVENOUS | Status: DC

## 2024-01-18 MED ORDER — AMLODIPINE BESYLATE 5 MG PO TABS
5.0000 mg | ORAL_TABLET | Freq: Every day | ORAL | Status: DC
  Administered 2024-01-19: 5 mg via ORAL
  Filled 2024-01-18 (×2): qty 1

## 2024-01-18 MED ORDER — DIPHENHYDRAMINE HCL 12.5 MG/5ML PO ELIX: 12.5000 mg | ORAL_SOLUTION | ORAL | Status: DC | PRN

## 2024-01-18 MED ORDER — ONDANSETRON HCL 4 MG/2ML IJ SOLN
4.0000 mg | Freq: Four times a day (QID) | INTRAMUSCULAR | Status: DC | PRN
  Administered 2024-01-18: 4 mg via INTRAVENOUS
  Filled 2024-01-18: qty 2

## 2024-01-18 MED ORDER — POVIDONE-IODINE 10 % EX SWAB: 2.0000 | Freq: Once | CUTANEOUS | Status: DC

## 2024-01-18 MED ORDER — DROPERIDOL 2.5 MG/ML IJ SOLN: 0.6250 mg | Freq: Once | INTRAMUSCULAR | Status: DC | PRN

## 2024-01-18 MED ORDER — METOCLOPRAMIDE HCL 5 MG/ML IJ SOLN: 5.0000 mg | Freq: Three times a day (TID) | INTRAMUSCULAR | Status: DC | PRN

## 2024-01-18 MED ORDER — ONDANSETRON HCL 4 MG/2ML IJ SOLN
INTRAMUSCULAR | Status: AC
  Filled 2024-01-18: qty 2

## 2024-01-18 MED ORDER — ALUM & MAG HYDROXIDE-SIMETH 200-200-20 MG/5ML PO SUSP: 30.0000 mL | ORAL | Status: DC | PRN

## 2024-01-18 MED ORDER — PHENYLEPHRINE 80 MCG/ML (10ML) SYRINGE FOR IV PUSH (FOR BLOOD PRESSURE SUPPORT)
PREFILLED_SYRINGE | INTRAVENOUS | Status: AC
  Filled 2024-01-18: qty 10

## 2024-01-18 MED ORDER — METHOCARBAMOL 1000 MG/10ML IJ SOLN
500.0000 mg | Freq: Four times a day (QID) | INTRAMUSCULAR | Status: DC | PRN
Start: 2024-01-18 — End: 2024-01-19

## 2024-01-18 MED ORDER — BUPIVACAINE-EPINEPHRINE 0.25% -1:200000 IJ SOLN
INTRAMUSCULAR | Status: DC | PRN
  Administered 2024-01-18: 30 mL

## 2024-01-18 MED ORDER — KETOROLAC TROMETHAMINE 15 MG/ML IJ SOLN
15.0000 mg | Freq: Once | INTRAMUSCULAR | Status: AC
  Administered 2024-01-18: 15 mg via INTRAVENOUS
  Filled 2024-01-18: qty 1

## 2024-01-18 MED ORDER — ACETAMINOPHEN 500 MG PO TABS
1000.0000 mg | ORAL_TABLET | Freq: Once | ORAL | Status: AC
  Administered 2024-01-18: 1000 mg via ORAL
  Filled 2024-01-18: qty 2

## 2024-01-18 MED ORDER — OXYCODONE HCL 5 MG PO TABS: 5.0000 mg | ORAL_TABLET | Freq: Once | ORAL | Status: DC | PRN

## 2024-01-18 MED ORDER — OXYCODONE HCL 5 MG PO TABS
10.0000 mg | ORAL_TABLET | ORAL | Status: DC | PRN
  Administered 2024-01-18 – 2024-01-19 (×2): 10 mg via ORAL
  Filled 2024-01-18: qty 2

## 2024-01-18 MED ORDER — BUPIVACAINE-EPINEPHRINE (PF) 0.5% -1:200000 IJ SOLN
INTRAMUSCULAR | Status: DC | PRN
  Administered 2024-01-18: 15 mL via PERINEURAL

## 2024-01-18 MED ORDER — FENTANYL CITRATE PF 50 MCG/ML IJ SOSY: 25.0000 ug | PREFILLED_SYRINGE | INTRAMUSCULAR | Status: DC | PRN

## 2024-01-18 MED ORDER — METHOCARBAMOL 500 MG PO TABS
500.0000 mg | ORAL_TABLET | Freq: Four times a day (QID) | ORAL | Status: DC | PRN
  Administered 2024-01-18 – 2024-01-19 (×3): 500 mg via ORAL
  Filled 2024-01-18 (×3): qty 1

## 2024-01-18 MED ORDER — METOCLOPRAMIDE HCL 5 MG PO TABS: 5.0000 mg | ORAL_TABLET | Freq: Three times a day (TID) | ORAL | Status: DC | PRN

## 2024-01-18 MED ORDER — DOCUSATE SODIUM 100 MG PO CAPS
100.0000 mg | ORAL_CAPSULE | Freq: Two times a day (BID) | ORAL | Status: DC
  Administered 2024-01-18 – 2024-01-19 (×2): 100 mg via ORAL
  Filled 2024-01-18 (×2): qty 1

## 2024-01-18 MED ORDER — OXYCODONE HCL 5 MG/5ML PO SOLN: 5.0000 mg | Freq: Once | ORAL | Status: DC | PRN

## 2024-01-18 MED ORDER — PHENYLEPHRINE HCL (PRESSORS) 10 MG/ML IV SOLN
INTRAVENOUS | Status: AC
  Filled 2024-01-18: qty 1

## 2024-01-18 MED ORDER — LACTATED RINGERS IV SOLN
INTRAVENOUS | Status: DC | PRN
Start: 2024-01-18 — End: 2024-01-18

## 2024-01-18 MED ORDER — PROPOFOL 500 MG/50ML IV EMUL
INTRAVENOUS | Status: DC | PRN
  Administered 2024-01-18: 100 ug/kg/min via INTRAVENOUS

## 2024-01-18 MED ORDER — LISINOPRIL 20 MG PO TABS
40.0000 mg | ORAL_TABLET | Freq: Every day | ORAL | Status: DC
  Administered 2024-01-19: 40 mg via ORAL
  Filled 2024-01-18 (×2): qty 2

## 2024-01-18 MED ORDER — MIDAZOLAM HCL 2 MG/2ML IJ SOLN
INTRAMUSCULAR | Status: DC | PRN
Start: 2024-01-18 — End: 2024-01-18
  Administered 2024-01-18 (×2): 1 mg via INTRAVENOUS

## 2024-01-18 MED ORDER — 0.9 % SODIUM CHLORIDE (POUR BTL) OPTIME
TOPICAL | Status: DC | PRN
  Administered 2024-01-18: 1000 mL

## 2024-01-18 MED ORDER — ROSUVASTATIN CALCIUM 5 MG PO TABS
5.0000 mg | ORAL_TABLET | Freq: Every day | ORAL | Status: DC
  Administered 2024-01-18: 5 mg via ORAL
  Filled 2024-01-18: qty 1

## 2024-01-18 MED ORDER — LATANOPROST 0.005 % OP SOLN
1.0000 [drp] | Freq: Every day | OPHTHALMIC | Status: DC
  Administered 2024-01-18: 1 [drp] via OPHTHALMIC
  Filled 2024-01-18: qty 2.5

## 2024-01-18 MED ORDER — OXYCODONE HCL 5 MG PO TABS
5.0000 mg | ORAL_TABLET | ORAL | Status: DC | PRN
  Administered 2024-01-18 – 2024-01-19 (×3): 10 mg via ORAL
  Filled 2024-01-18 (×2): qty 2
  Filled 2024-01-18: qty 1
  Filled 2024-01-18: qty 2
  Filled 2024-01-18: qty 1

## 2024-01-18 MED ORDER — MIDAZOLAM HCL 2 MG/2ML IJ SOLN
1.0000 mg | Freq: Once | INTRAMUSCULAR | Status: AC
  Administered 2024-01-18: 1 mg via INTRAVENOUS
  Filled 2024-01-18: qty 2

## 2024-01-18 MED ORDER — PROPOFOL 10 MG/ML IV BOLUS
INTRAVENOUS | Status: AC
  Filled 2024-01-18: qty 20

## 2024-01-18 MED ORDER — ORAL CARE MOUTH RINSE: 15.0000 mL | Freq: Once | OROMUCOSAL | Status: AC

## 2024-01-18 MED ORDER — CLONIDINE HCL (ANALGESIA) 100 MCG/ML EP SOLN
EPIDURAL | Status: DC | PRN
  Administered 2024-01-18: 100 ug

## 2024-01-18 MED ORDER — MENTHOL 3 MG MT LOZG: 1.0000 | LOZENGE | OROMUCOSAL | Status: DC | PRN

## 2024-01-18 MED ORDER — HYDROMORPHONE HCL 1 MG/ML IJ SOLN: 0.5000 mg | INTRAMUSCULAR | Status: DC | PRN

## 2024-01-18 MED ORDER — SODIUM CHLORIDE 0.9 % IV SOLN: INTRAVENOUS | Status: AC

## 2024-01-18 MED ORDER — METFORMIN HCL 500 MG PO TABS
1000.0000 mg | ORAL_TABLET | Freq: Two times a day (BID) | ORAL | Status: DC
  Filled 2024-01-18 (×2): qty 2

## 2024-01-18 MED ORDER — DEXAMETHASONE SODIUM PHOSPHATE 10 MG/ML IJ SOLN
INTRAMUSCULAR | Status: AC
  Filled 2024-01-18: qty 1

## 2024-01-18 MED ORDER — ONDANSETRON HCL 4 MG/2ML IJ SOLN
INTRAMUSCULAR | Status: DC | PRN
  Administered 2024-01-18: 4 mg via INTRAVENOUS

## 2024-01-18 SURGICAL SUPPLY — 46 items
BAG COUNTER SPONGE SURGICOUNT (BAG) IMPLANT
BAG ZIPLOCK 12X15 (MISCELLANEOUS) ×1 IMPLANT
BLADE SAG 18X100X1.27 (BLADE) ×1 IMPLANT
BNDG ELASTIC 6INX 5YD STR LF (GAUZE/BANDAGES/DRESSINGS) ×2 IMPLANT
BOWL SMART MIX CTS (DISPOSABLE) IMPLANT
CEMENT BONE R 1X40 (Cement) IMPLANT
CHLORAPREP W/TINT 26 (MISCELLANEOUS) IMPLANT
COMPONET TIB PS KNEE E 0D RT (Joint) IMPLANT
COOLER ICEMAN CLASSIC (MISCELLANEOUS) ×1 IMPLANT
COVER SURGICAL LIGHT HANDLE (MISCELLANEOUS) ×1 IMPLANT
CUFF TRNQT CYL 34X4.125X (TOURNIQUET CUFF) ×1 IMPLANT
DRAPE INCISE 23X17 STRL (DRAPES) IMPLANT
DRAPE U-SHAPE 47X51 STRL (DRAPES) ×1 IMPLANT
ELECT BLADE TIP CTD 4 INCH (ELECTRODE) ×1 IMPLANT
ELECT PENCIL ROCKER SW 15FT (MISCELLANEOUS) ×1 IMPLANT
ELECT REM PT RETURN 15FT ADLT (MISCELLANEOUS) ×1 IMPLANT
FEMUR CMTD CR STD SZ 8 RT KNEE (Joint) IMPLANT
GAUZE PAD ABD 8X10 STRL (GAUZE/BANDAGES/DRESSINGS) ×2 IMPLANT
GAUZE SPONGE 4X4 12PLY STRL (GAUZE/BANDAGES/DRESSINGS) ×1 IMPLANT
GAUZE XEROFORM 1X8 LF (GAUZE/BANDAGES/DRESSINGS) IMPLANT
GLOVE BIOGEL PI IND STRL 8 (GLOVE) ×2 IMPLANT
GOWN STRL REUS W/ TWL XL LVL3 (GOWN DISPOSABLE) ×2 IMPLANT
HOLDER FOLEY CATH W/STRAP (MISCELLANEOUS) IMPLANT
IMMOBILIZER KNEE 20 THIGH 36 (SOFTGOODS) ×1 IMPLANT
INSERT TIB ARTISURF SZ8-11X12 (Insert) IMPLANT
KIT TURNOVER KIT A (KITS) ×1 IMPLANT
MANIFOLD NEPTUNE II (INSTRUMENTS) ×1 IMPLANT
NS IRRIG 1000ML POUR BTL (IV SOLUTION) ×1 IMPLANT
PACK TOTAL KNEE CUSTOM (KITS) ×1 IMPLANT
PAD COLD SHLDR WRAP-ON (PAD) ×1 IMPLANT
PADDING CAST COTTON 6X4 STRL (CAST SUPPLIES) ×2 IMPLANT
PIN DRILL HDLS TROCAR 75 4PK (PIN) IMPLANT
PROTECTOR NERVE ULNAR (MISCELLANEOUS) ×1 IMPLANT
SCREW FEMALE HEX FIX 25X2.5 (ORTHOPEDIC DISPOSABLE SUPPLIES) IMPLANT
SET HNDPC FAN SPRY TIP SCT (DISPOSABLE) ×1 IMPLANT
SET PAD KNEE POSITIONER (MISCELLANEOUS) ×1 IMPLANT
SOLUTION SCRUB CHG EXIDINE (MISCELLANEOUS) IMPLANT
STAPLER SKIN PROX 35W (STAPLE) IMPLANT
STEM POLY PAT PLY 32M KNEE (Knees) IMPLANT
SUT VIC AB 0 CT1 36 (SUTURE) ×1 IMPLANT
SUT VIC AB 1 CT1 36 (SUTURE) ×2 IMPLANT
SUT VIC AB 2-0 CT1 TAPERPNT 27 (SUTURE) ×2 IMPLANT
TOWEL GREEN STERILE FF (TOWEL DISPOSABLE) ×1 IMPLANT
TRAY FOL W/BAG SLVR 16FR STRL (SET/KITS/TRAYS/PACK) IMPLANT
WATER STERILE IRR 1000ML POUR (IV SOLUTION) ×2 IMPLANT
YANKAUER SUCT BULB TIP NO VENT (SUCTIONS) ×1 IMPLANT

## 2024-01-18 NOTE — Anesthesia Procedure Notes (Signed)
 Spinal  Patient location during procedure: OR Start time: 01/18/2024 9:51 AM End time: 01/18/2024 9:54 AM Reason for block: surgical anesthesia Staffing Performed: anesthesiologist  Anesthesiologist: Paul Lamarr BRAVO, MD Performed by: Paul Lamarr BRAVO, MD Authorized by: Paul Lamarr BRAVO, MD   Preanesthetic Checklist Completed: patient identified, IV checked, risks and benefits discussed, surgical consent, monitors and equipment checked, pre-op evaluation and timeout performed Spinal Block Patient position: sitting Prep: DuraPrep and site prepped and draped Patient monitoring: continuous pulse ox, blood pressure and heart rate Approach: midline Location: L3-4 Injection technique: single-shot Needle Needle type: Pencan  Needle gauge: 24 G Needle length: 9 cm Assessment Events: CSF return Additional Notes Risks, benefits, and alternative discussed. Patient gave consent to procedure. Prepped and draped in sitting position. Patient sedated but responsive to voice. Clear CSF obtained after one needle redirection. Positive terminal aspiration. No pain or paraesthesias with injection. Patient tolerated procedure well. Vital signs stable. LANEY Paul, MD

## 2024-01-18 NOTE — Op Note (Signed)
 Operative Note  Date of operation: 01/18/2024 Preoperative diagnosis: Right knee primary osteoarthritis Postoperative diagnosis: Same  Procedure: Right cemented total knee arthroplasty  Implants: Biomet/Zimmer persona cemented knee system Implant Name Type Inv. Item Serial No. Manufacturer Lot No. LRB No. Used Action  CEMENT BONE R 1X40 - [id number] Cement CEMENT BONE R 1X40  ZIMMER [employer]ON(ORTH,TRAU,BIO,SG) [id number] Right 1 Implanted  CEMENT BONE R 1X40 - [id number] Cement CEMENT BONE R 1X40  [employer]IMMER [employer]ON(ORTH,TRAU,BIO,SG) [id number] Right 1 Implanted  COMPONET TIB PS KNEE E 0D RT - [id number] Joint COMPONET TIB PS KNEE E 0D RT  ZIMMER [employer]ON(ORTH,TRAU,BIO,SG) [id number] Right 1 Implanted  STEM POLY PAT PLY 67M KNEE - [id number] Knees STEM POLY PAT PLY 67M KNEE  ZIMMER [employer]ON(ORTH,TRAU,BIO,SG) [id number] Right 1 Implanted  FEMUR CMTD CR STD SZ 8 RT KNEE - [id number] Joint FEMUR CMTD CR STD SZ 8 RT KNEE  ZIMMER [employer]ON(ORTH,TRAU,BIO,SG) [id number] Right 1 Implanted  INSERT TIB ARTISURF [id number] - [id number] Insert INSERT TIB ARTISURF [id number]  ZIMMER [employer]ON(ORTH,TRAU,BIO,SG) [id number] Right 1 Implanted   Surgeon: Lonni GRADE. 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  She does have a history of a left total knee arthroplasty done remotely secondary to significant left knee arthritis.  That knee has done well.  At this point her right knee pain is daily and it is detrimentally affecting her mobility, her quality of life and her actives daily living.  She has tried and failed all forms conservative treatment and does wish to proceed with a knee replacement on the right side.  Having had this before left side she is  fully aware of the risks of acute blood loss anemia, nerve or vessel injury, fracture, infection, DVT, implant failure and wound and soft tissue healing issues.  All of these are heightened given her morbid obesity.  She understands that our goals are hopefully decreased pain, improved mobility, and improved quality of life.  Procedure description: After informed consent was obtained and the appropriate right knee was marked, anesthesia obtained a right lower extremity adductor canal block in the holding room.  The patient was then brought to the operating room and set up on the operating table where spinal anesthesia was obtained.  She was then laid in a supine position on the operating table and a Foley catheter was placed.  A nonsterile tourniquet was placed around her upper right thigh and her right thigh, knee, leg, ankle and foot were prepped and draped with ChloraPrep and sterile drapes.  A timeout was called and she was then advised the correct patient the correct right knee.  An Esmarch was then used to wrap out the leg and the tourniquet was inflated to 300 mm pressure.  With the knee extended a direct midline incision was made over the patella and carried proximally distally.  Dissection was carried down to the knee joint and a medial parapatellar arthrotomy was made finding a large joint effusion.  With the knee in a flexed position we found significant osteophytes and cartilage wear throughout the knee.  Osteophytes were removed from all 3 compartments as well as remnants of the ACL and medial lateral meniscus.  We then used an extramedullary based cutting guide for proximal tibia cut correction for varus and valgus and  a 7 degree slope.  We made this cut to take 2 mm off the low side.  We made this cut without difficulty.  We then used an intramedullary based cutting guide for distal femur cut setting this for a right knee at 5 degrees actually rotated and a 10 mm distal femoral cut.  We made that  cut without difficulty and brought the knee back down to full extension and had achieved full extension with a 10 mm block.  We then back to the femur and put a femoral sizing guide based off the epicondylar axis.  Based off of this we chose a size 8 femur.  We put a 4-in-1 cutting block for a size 8 femur and made our anterior and posterior cuts followed by our chamfer cuts.  We then impacted the tibia and chose a size E tibial tray for right knee for coverage over the tibial plateau second rotation of the tibial tubercle and the femur.  We did a drill hole and keel punch off of this.  We then trialed our size D right tibia combined with our size 8 right CR standard femur.  We placed a 10 mm medial congruent right polythene insert 1 up to a 12 mm thickness insert we are pleased with range of motion and stability without insert.  We then made a patella cut and drilled 3 holes for a size 32 patella button.  Again with all trial instrumentation in the knee we are pleased with range of motion and stability.  All trial instrumentation was then removed and then we irrigated the soft tissue with normal saline solution using pulsatile lavage.  We then placed Marcaine  with epinephrine  around the arthrotomy.  Next with the knee in a flexed position we mixed our cement and then cemented our Biomet/Zimmer persona tibial tray for right knee size EE followed by cementing our size 8 right CR standard femur.  We placed our 12 mm thickness right medial congruent polythene insert and cemented a size 32 patella button.  We then held the knee fully compressed and extended while the cemented hardened.  Once the cemented hardened the tourniquet was let down and hemostasis was obtained with electrocautery.  The arthrotomy was then closed with interrupted #1 Vicryl suture followed by 0 Vicryl because the deep tissue and 2-0 Vicryl because subcutaneous tissue.  The skin was reapproximated staples.  Well-padded sterile dressing was applied.   The patient was taken the recovery room.  Tory Gaskins, PA-C did assist during the entire case and beginning the end and his assistance was crucial and medically necessary for soft tissue management and retraction, helping guide implant placement and a layered closure of the wound.

## 2024-01-18 NOTE — Discharge Instructions (Addendum)
 Information on my medicine - ELIQUIS  (apixaban )  This medication education was reviewed with me or my healthcare representative as part of my discharge preparation.  The pharmacist that spoke with me during my hospital stay was:    Why was Eliquis  prescribed for you? Eliquis  was prescribed for you to reduce the risk of blood clots forming after orthopedic surgery.    What do You need to know about Eliquis ? Take your Eliquis  TWICE DAILY - one tablet in the morning and one tablet in the evening with or without food.  It would be best to take the dose about the same time each day.  If you have difficulty swallowing the tablet whole please discuss with your pharmacist how to take the medication safely.  Take Eliquis  exactly as prescribed by your doctor and DO NOT stop taking Eliquis  without talking to the doctor who prescribed the medication.  Stopping without other medication to take the place of Eliquis  may increase your risk of developing a clot.  After discharge, you should have regular check-up appointments with your healthcare provider that is prescribing your Eliquis .  What do you do if you miss a dose? If a dose of ELIQUIS  is not taken at the scheduled time, take it as soon as possible on the same day and twice-daily administration should be resumed.  The dose should not be doubled to make up for a missed dose.  Do not take more than one tablet of ELIQUIS  at the same time.  Important Safety Information A possible side effect of Eliquis  is bleeding. You should call your healthcare provider right away if you experience any of the following: Bleeding from an injury or your nose that does not stop. Unusual colored urine (red or dark brown) or unusual colored stools (red or black). Unusual bruising for unknown reasons. A serious fall or if you hit your head (even if there is no bleeding).  Some medicines may interact with Eliquis  and might increase your risk of bleeding or  clotting while on Eliquis . To help avoid this, consult your healthcare provider or pharmacist prior to using any new prescription or non-prescription medications, including herbals, vitamins, non-steroidal anti-inflammatory drugs (NSAIDs) and supplements.  This website has more information on Eliquis  (apixaban ): http://[email or web address] .com/eliquis dena  Per [facility] clinic policy, our goal is ensure optimal postoperative pain control with a multimodal pain management strategy. For all [facility] patients, our goal is to wean post-operative narcotic medications by 6 weeks post-operatively. If this is not possible due to utilization of pain medication prior to surgery, your [facility] doctor will support your acute post-operative pain control for the first 6 weeks postoperatively, with a plan to transition you back to your primary pain team following that. Maralee will work to ensure a Therapist, occupational.  INSTRUCTIONS AFTER JOINT REPLACEMENT   Remove items at home which could result in a fall. This includes throw rugs or furniture in walking pathways ICE to the affected joint every three hours while awake for 30 minutes at a time, for at least the first 3-5 days, and then as needed for pain and swelling.  Continue to use ice for pain and swelling. You may notice swelling that will progress down to the foot and ankle.  This is normal after surgery.  Elevate your leg when you are not up walking on it.   Continue to use the breathing machine you got in the hospital (incentive spirometer) which will help keep your temperature down.  It is common for your temperature to  cycle up and down following surgery, especially at night when you are not up moving around and exerting yourself.  The breathing machine keeps your lungs expanded and your temperature down.   DIET:  As you were doing prior to hospitalization, we recommend a well-balanced diet.  DRESSING / WOUND CARE / SHOWERING  Keep the surgical dressing until  follow up.  The dressing is water  proof, so you can shower without any extra covering.  IF THE DRESSING FALLS OFF or the wound gets wet inside, change the dressing with sterile gauze.  Please use good hand washing techniques before changing the dressing.  Do not use any lotions or creams on the incision until instructed by your surgeon.    ACTIVITY  Increase activity slowly as tolerated, but follow the weight bearing instructions below.   No driving for 6 weeks or until further direction given by your physician.  You cannot drive while taking narcotics.  No lifting or carrying greater than 10 lbs. until further directed by your surgeon. Avoid periods of inactivity such as sitting longer than an hour when not asleep. This helps prevent blood clots.  You may return to work once you are authorized by your doctor.     WEIGHT BEARING   Weight bearing as tolerated with assist device (walker, cane, etc) as directed, use it as long as suggested by your surgeon or therapist, typically at least 4-6 weeks.   EXERCISES  Results after joint replacement surgery are often greatly improved when you follow the exercise, range of motion and muscle strengthening exercises prescribed by your doctor. Safety measures are also important to protect the joint from further injury. Any time any of these exercises cause you to have increased pain or swelling, decrease what you are doing until you are comfortable again and then slowly increase them. If you have problems or questions, call your caregiver or physical therapist for advice.   Rehabilitation is important following a joint replacement. After just a few days of immobilization, the muscles of the leg can become weakened and shrink (atrophy).  These exercises are designed to build up the tone and strength of the thigh and leg muscles and to improve motion. Often times heat used for twenty to thirty minutes before working out will loosen up your tissues and help with  improving the range of motion but do not use heat for the first two weeks following surgery (sometimes heat can increase post-operative swelling).   These exercises can be done on a training (exercise) mat, on the floor, on a table or on a bed. Use whatever works the best and is most comfortable for you.    Use music or television while you are exercising so that the exercises are a pleasant break in your day. This will make your life better with the exercises acting as a break in your routine that you can look forward to.   Perform all exercises about fifteen times, three times per day or as directed.  You should exercise both the operative leg and the other leg as well.  Exercises include:   Quad Sets - Tighten up the muscle on the front of the thigh (Quad) and hold for 5-10 seconds.   Straight Leg Raises - With your knee straight (if you were given a brace, keep it on), lift the leg to 60 degrees, hold for 3 seconds, and slowly lower the leg.  Perform this exercise against resistance later as your leg gets stronger.  Leg Slides:  Lying on your back, slowly slide your foot toward your buttocks, bending your knee up off the floor (only go as far as is comfortable). Then slowly slide your foot back down until your leg is flat on the floor again.  Angel Wings: Lying on your back spread your legs to the side as far apart as you can without causing discomfort.  Hamstring Strength:  Lying on your back, push your heel against the floor with your leg straight by tightening up the muscles of your buttocks.  Repeat, but this time bend your knee to a comfortable angle, and push your heel against the floor.  You may put a pillow under the heel to make it more comfortable if necessary.   A rehabilitation program following joint replacement surgery can speed recovery and prevent re-injury in the future due to weakened muscles. Contact your doctor or a physical therapist for more information on knee rehabilitation.     CONSTIPATION  Constipation is defined medically as fewer than three stools per week and severe constipation as less than one stool per week.  Even if you have a regular bowel pattern at home, your normal regimen is likely to be disrupted due to multiple reasons following surgery.  Combination of anesthesia, postoperative narcotics, change in appetite and fluid intake all can affect your bowels.   YOU MUST use at least one of the following options; they are listed in order of increasing strength to get the job done.  They are all available over the counter, and you may need to use some, POSSIBLY even all of these options:    Drink plenty of fluids (prune juice may be helpful) and high fiber foods Colace 100 mg by mouth twice a day  Senokot for constipation as directed and as needed Dulcolax (bisacodyl ), take with full glass of water   Miralax  (polyethylene glycol) once or twice a day as needed.  If you have tried all these things and are unable to have a bowel movement in the first 3-4 days after surgery call either your surgeon or your primary doctor.    If you experience loose stools or diarrhea, hold the medications until you stool forms back up.  If your symptoms do not get better within 1 week or if they get worse, check with your doctor.  If you experience the worst abdominal pain ever or develop nausea or vomiting, please contact the office immediately for further recommendations for treatment.   ITCHING:  If you experience itching with your medications, try taking only a single pain pill, or even half a pain pill at a time.  You can also use Benadryl  over the counter for itching or also to help with sleep.   TED HOSE STOCKINGS:  Use stockings on both legs until for at least 2 weeks or as directed by physician office. They may be removed at night for sleeping.  MEDICATIONS:  See your medication summary on the "After Visit Summary" that nursing will review with you.  You may have some  home medications which will be placed on hold until you complete the course of blood thinner medication.  It is important for you to complete the blood thinner medication as prescribed.  PRECAUTIONS:  If you experience chest pain or shortness of breath - call 911 immediately for transfer to the hospital emergency department.   If you develop a fever greater that 101 F, purulent drainage from wound, increased redness or drainage from wound, foul odor from the wound/dressing, or  calf pain - CONTACT YOUR SURGEON.                                                   FOLLOW-UP APPOINTMENTS:  If you do not already have a post-op appointment, please call the office for an appointment to be seen by your surgeon.  Guidelines for how soon to be seen are listed in your "After Visit Summary", but are typically between 1-4 weeks after surgery.  OTHER INSTRUCTIONS:   Knee Replacement:  Do not place pillow under knee, focus on keeping the knee straight while resting. CPM instructions: 0-90 degrees, 2 hours in the morning, 2 hours in the afternoon, and 2 hours in the evening. Place foam block, curve side up under heel at all times except when in CPM or when walking.  DO NOT modify, tear, cut, or change the foam block in any way.  POST-OPERATIVE OPIOID TAPER INSTRUCTIONS: It is important to wean off of your opioid medication as soon as possible. If you do not need pain medication after your surgery it is ok to stop day one. Opioids include: Codeine, Hydrocodone(Norco, Vicodin), Oxycodone (Percocet, oxycontin ) and hydromorphone  amongst others.  Long term and even short term use of opiods can cause: Increased pain response Dependence Constipation Depression Respiratory depression And more.  Withdrawal symptoms can include Flu like symptoms Nausea, vomiting And more Techniques to manage these symptoms Hydrate well Eat regular healthy meals Stay active Use relaxation techniques(deep breathing, meditating,  yoga) Do Not substitute Alcohol to help with tapering If you have been on opioids for less than two weeks and do not have pain than it is ok to stop all together.  Plan to wean off of opioids This plan should start within one week post op of your joint replacement. Maintain the same interval or time between taking each dose and first decrease the dose.  Cut the total daily intake of opioids by one tablet each day Next start to increase the time between doses. The last dose that should be eliminated is the evening dose.   MAKE SURE YOU:  Understand these instructions.  Get help right away if you are not doing well or get worse.    Thank you for letting us  be a part of your medical care team.  It is a privilege we respect greatly.  We hope these instructions will help you stay on track for a fast and full recovery!     Dental Antibiotics:  In most cases prophylactic antibiotics for Dental procdeures after total joint surgery are not necessary.  Exceptions are as follows:  1. History of prior total joint infection  2. Severely immunocompromised (Organ Transplant, cancer chemotherapy, Rheumatoid biologic meds such as Humera)  3. Poorly controlled diabetes (A1C &gt; 8.0, blood glucose over 200)  If you have one of these conditions, contact your surgeon for an antibiotic prescription, prior to your dental procedure.

## 2024-01-18 NOTE — Progress Notes (Signed)
 Patient reports bladder stimulator is OFF, remote with belongings.

## 2024-01-18 NOTE — Anesthesia Procedure Notes (Signed)
 Procedure Name: MAC Date/Time: 01/18/2024 10:15 AM  Performed by: Obadiah Reyes BROCKS, CRNAPre-anesthesia Checklist: Patient identified, Emergency Drugs available, Suction available, Patient being monitored and Timeout performed Patient Re-evaluated:Patient Re-evaluated prior to induction Oxygen Delivery Method: Simple face mask Preoxygenation: Pre-oxygenation with 100% oxygen Induction Type: IV induction Airway Equipment and Method: Oral airway

## 2024-01-18 NOTE — Interval H&P Note (Signed)
 History and Physical Interval Note: The patient understands that she is here today for right total knee replacement to treat her significant right knee pain and arthritis.  There has been no acute or interval change in her medical status.  The risks and benefits of surgery have been discussed in detail and informed consent has been obtained.  The right operative knee has been marked.  01/18/2024 8:35 AM  Michelle Kennedy  has presented today for surgery, with the diagnosis of osteoarthritis right knee.  The various methods of treatment have been discussed with the patient and family. After consideration of risks, benefits and other options for treatment, the patient has consented to  Procedure(s): ARTHROPLASTY, KNEE, TOTAL (Right) as a surgical intervention.  The patient's history has been reviewed, patient examined, no change in status, stable for surgery.  I have reviewed the patient's chart and labs.  Questions were answered to the patient's satisfaction.     [name]

## 2024-01-18 NOTE — Plan of Care (Signed)
  Problem: Education: Goal: Knowledge of General Education information will improve Description: Including pain rating scale, medication(s)/side effects and non-pharmacologic comfort measures Outcome: Progressing   Problem: Nutrition: Goal: Adequate nutrition will be maintained Outcome: Progressing   Problem: Coping: Goal: Level of anxiety will decrease Outcome: Progressing   Problem: Elimination: Goal: Will not experience complications related to urinary retention Outcome: Progressing   Problem: Pain Managment: Goal: General experience of comfort will improve and/or be controlled Outcome: Progressing

## 2024-01-18 NOTE — Anesthesia Postprocedure Evaluation (Signed)
 Anesthesia Post Note  Patient: Michelle Kennedy  Procedure(s) Performed: ARTHROPLASTY, KNEE, TOTAL (Right: Knee)     Patient location during evaluation: PACU Anesthesia Type: Spinal Level of consciousness: awake and alert Pain management: pain level controlled Vital Signs Assessment: post-procedure vital signs reviewed and stable Respiratory status: spontaneous breathing, nonlabored ventilation and respiratory function stable Cardiovascular status: blood pressure returned to baseline Postop Assessment: no apparent nausea or vomiting, spinal receding, no headache and no backache Anesthetic complications: no   No notable events documented.  Last Vitals:  Vitals:   01/18/24 1230 01/18/24 1245  BP: 106/70 113/79  Pulse: 73 72  Resp: 16 15  Temp:  36.4 C  SpO2: 97% 98%    Last Pain:  Vitals:   01/18/24 1245  TempSrc:   PainSc: 0-No pain                 Vertell Row

## 2024-01-18 NOTE — Anesthesia Procedure Notes (Signed)
 Anesthesia Regional Block: Adductor canal block   Pre-Anesthetic Checklist: , timeout performed,  Correct Patient, Correct Site, Correct Laterality,  Correct Procedure, Correct Position, site marked,  Risks and benefits discussed,  Pre-op evaluation,  At surgeon's request and post-op pain management  Laterality: Right  Prep: Maximum Sterile Barrier Precautions used, chloraprep       Needles:  Injection technique: Single-shot  Needle Type: Echogenic Stimulator Needle     Needle Length: 9cm  Needle Gauge: 22     Additional Needles:   Procedures:,,,, ultrasound used (permanent image in chart),,    Narrative:  Start time: 01/18/2024 9:12 AM End time: 01/18/2024 9:15 AM Injection made incrementally with aspirations every 5 mL.  Performed by: Personally  Anesthesiologist: Paul Lamarr BRAVO, MD  Additional Notes: Risks, benefits, and alternative discussed. Patient gave consent for procedure. Patient prepped and draped in sterile fashion. Sedation administered, patient remains easily responsive to voice. Relevant anatomy identified with ultrasound guidance. Local anesthetic given in 5cc increments with no signs or symptoms of intravascular injection. No pain or paraesthesias with injection. Patient monitored throughout procedure with signs of LAST or immediate complications. Tolerated well. Ultrasound image placed in chart.  LANEY Paul, MD

## 2024-01-18 NOTE — Transfer of Care (Signed)
 Immediate Anesthesia Transfer of Care Note  Patient: Michelle Kennedy  Procedure(s) Performed: ARTHROPLASTY, KNEE, TOTAL (Right: Knee)  Patient Location: PACU  Anesthesia Type:MAC, Regional, and Spinal  Level of Consciousness: sedated  Airway & Oxygen Therapy: Patient Spontanous Breathing and Patient connected to face mask oxygen  Post-op Assessment: Report given to RN and Post -op Vital signs reviewed and stable  Post vital signs: Reviewed and stable  Last Vitals:  Vitals Value Taken Time  BP 95/53 01/18/24 11:50  Temp    Pulse 71 01/18/24 11:54  Resp 14 01/18/24 11:54  SpO2 93 % 01/18/24 11:54  Vitals shown include unfiled device data.  Last Pain:  Vitals:   01/18/24 0930  TempSrc:   PainSc: 0-No pain         Complications: No notable events documented.

## 2024-01-19 ENCOUNTER — Other Ambulatory Visit: Payer: Self-pay | Admitting: Family

## 2024-01-19 DIAGNOSIS — M1711 Unilateral primary osteoarthritis, right knee: Secondary | ICD-10-CM | POA: Diagnosis not present

## 2024-01-19 LAB — CBC
HCT: 36.6 % (ref 36.0–46.0)
Hemoglobin: 11 g/dL — ABNORMAL LOW (ref 12.0–15.0)
MCH: 24.1 pg — ABNORMAL LOW (ref 26.0–34.0)
MCHC: 30.1 g/dL (ref 30.0–36.0)
MCV: 80.3 fL (ref 80.0–100.0)
Platelets: 250 K/uL (ref 150–400)
RBC: 4.56 MIL/uL (ref 3.87–5.11)
RDW: 16.6 % — ABNORMAL HIGH (ref 11.5–15.5)
WBC: 11.8 K/uL — ABNORMAL HIGH (ref 4.0–10.5)
nRBC: 0 % (ref 0.0–0.2)

## 2024-01-19 LAB — BASIC METABOLIC PANEL WITH GFR
Anion gap: 11 (ref 5–15)
BUN: 16 mg/dL (ref 8–23)
CO2: 23 mmol/L (ref 22–32)
Calcium: 9 mg/dL (ref 8.9–10.3)
Chloride: 108 mmol/L (ref 98–111)
Creatinine, Ser: 0.83 mg/dL (ref 0.44–1.00)
GFR, Estimated: >60 mL/min (ref >60)
Glucose, Bld: 115 mg/dL — ABNORMAL HIGH (ref 70–99)
Potassium: 5 mmol/L (ref 3.5–5.1)
Sodium: 143 mmol/L (ref 135–145)

## 2024-01-19 LAB — GLUCOSE, CAPILLARY: Glucose-Capillary: 101 mg/dL — ABNORMAL HIGH (ref 70–99)

## 2024-01-19 MED ORDER — ONDANSETRON 4 MG PO TBDP: 4.0000 mg | ORAL_TABLET | Freq: Three times a day (TID) | ORAL | 0 refills | Status: AC | PRN

## 2024-01-19 MED ORDER — OXYCODONE HCL 5 MG PO TABS: 5.0000 mg | ORAL_TABLET | Freq: Four times a day (QID) | ORAL | 0 refills | Status: DC | PRN

## 2024-01-19 MED ORDER — TIZANIDINE HCL 2 MG PO TABS: 2.0000 mg | ORAL_TABLET | Freq: Four times a day (QID) | ORAL | 0 refills | Status: DC | PRN

## 2024-01-19 MED ORDER — APIXABAN 2.5 MG PO TABS
2.5000 mg | ORAL_TABLET | Freq: Two times a day (BID) | ORAL | 0 refills | Status: DC
Start: 2024-01-19 — End: 2024-01-31

## 2024-01-19 NOTE — Progress Notes (Signed)
 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 Ted hose placed on RLE- ace wrap was removed earlier by Dr Vernetta. No other questions at this time

## 2024-01-19 NOTE — TOC Transition Note (Signed)
 Transition of Care [facility]) - Discharge Note  Patient Details  Name: Michelle Kennedy MRN: [id number] Date of Birth: [birth year]  Transition of [facility]) CM/SW Contact:  Duwaine GORMAN Aran, LCSW Phone Number: 01/19/2024, 9:57 AM  Clinical Narrative: CSW met with patient to confirm discharge plan. Patient will go home with HHPT, which was prearranged with [facility]. Patient will need a rolling walker and is agreeable to DME referral to [employer]. CSW made rolling walker referral to Aida with [employer]. [employer] to deliver the rolling walker to patient's room. Care management signing off.  Final next level of care: Home w Home Health Services Barriers to Discharge: Barriers Resolved  Patient Goals and CMS Choice Patient states their goals for this hospitalization and ongoing recovery are:: HHPT with [facility] CMS Medicare.gov Compare Post Acute Care list provided to:: Patient Choice offered to / list presented to : Patient  Discharge Plan and Services Additional resources added to the After Visit Summary for      DME Arranged: Walker rolling DME Agency: [employer] Date DME Agency Contacted: 01/19/24 Time DME Agency Contacted: [phone] Representative spoke with at DME Agency: Caprice CHEADLE Agency: [facility] Representative spoke with at [facility] Agency: Prearranged in orthopedist's office  Social Drivers of Health (SDOH) Interventions SDOH Screenings   Food Insecurity: No Food Insecurity (01/18/2024)  Housing: Low Risk  (01/18/2024)  Transportation Needs: No Transportation Needs (01/18/2024)  Utilities: Not At Risk (01/18/2024)  Alcohol Screen: Low Risk  (08/23/2022)  Depression (PHQ2-9): Low Risk  (10/02/2023)  Financial Resource Strain: Low Risk  (11/22/2023)  Physical Activity: Sufficiently Active (11/22/2023)  Social Connections: Socially Integrated (01/18/2024)  Stress: No Stress Concern Present (11/22/2023)  Tobacco Use: Low Risk  (01/18/2024)   Readmission Risk Interventions     No data to display

## 2024-01-19 NOTE — Care Management Obs Status (Signed)
 MEDICARE OBSERVATION STATUS NOTIFICATION   Patient Details  Name: Michelle Kennedy MRN: [id number] Date of Birth: [birth year]   Medicare Observation Status Notification Given:  Yes    Duwaine GORMAN Aran, LCSW 01/19/2024, 9:43 AM

## 2024-01-19 NOTE — Progress Notes (Signed)
 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  Pt up to ambulate in hall, negotiated stairs and reviewed written HEP.  RW for home in room and adjusted to pt.  Pt eager for return home this date.    If plan is discharge home, recommend the following: A little help with walking and/or transfers;A little help with bathing/dressing/bathroom;Assistance with cooking/housework;Assist for transportation;Help with stairs or ramp for entrance   Can travel by private vehicle        Equipment Recommendations  Rolling walker (2 wheels)    Recommendations for Other Services       Precautions / Restrictions Precautions Precautions: Fall;Knee Required Braces or Orthoses: Knee Immobilizer - Right Knee Immobilizer - Right: Discontinue once straight leg raise with < 10 degree lag Restrictions Weight Bearing Restrictions Per Provider Order: Yes RLE Weight Bearing Per Provider Order: Weight bearing as tolerated     Mobility  Bed Mobility Overal bed mobility: Needs Assistance Bed Mobility: Supine to Sit     Supine to sit: Min assist     General bed mobility comments: INcreased time, use of bed rail, assist to manage R LE    Transfers Overall transfer level: Needs assistance Equipment used: Rolling walker (2 wheels) Transfers: Sit to/from Stand Sit to Stand: Contact guard assist, Supervision           General transfer comment: cues for LE management and use of UEs to self assist    Ambulation/Gait Ambulation/Gait assistance: Contact guard assist, Supervision Gait Distance (Feet): 75 Feet Assistive device: Rolling walker (2 wheels) Gait Pattern/deviations: Step-to pattern, Shuffle, Decreased step length -  right, Decreased step length - left, Trunk flexed Gait velocity: decr     General Gait Details: min cues for sequence, posture and position from RW   Stairs Stairs: Yes Stairs assistance: Min assist Stair Management: No rails, Step to pattern, Backwards, Forwards, With walker Number of Stairs: 2 General stair comments: single step twice - once fwd and once bkwd   Wheelchair Mobility     Tilt Bed    Modified Rankin (Stroke Patients Only)       Balance Overall balance assessment: Mild deficits observed, not formally tested                                          Communication Communication Communication: Impaired Factors Affecting Communication: Hearing impaired  Cognition Arousal: Alert Behavior During Therapy: WFL for tasks assessed/performed   PT - Cognitive impairments: No apparent impairments                         Following commands: Intact      Cueing Cueing Techniques: Verbal cues  Exercises Total Joint Exercises Ankle Circles/Pumps: AROM, Both, 15 reps, Supine Quad Sets: AROM, Both, 10 reps, Supine Heel Slides: AAROM, Right, 15 reps, Supine Straight Leg Raises: AAROM, AROM, Right, 15 reps, Supine    General Comments        Pertinent Vitals/Pain Pain Assessment Pain Assessment: 0-10 Pain Score: 5  Pain Location: R knee Pain Descriptors / Indicators: Aching, Sore Pain Intervention(s):  Limited activity within patient's tolerance, Monitored during session, Premedicated before session, Ice applied    Home Living                          Prior Function            PT Goals (current goals can now be found in the care plan section) Acute Rehab PT Goals Patient Stated Goal: Regain IND PT Goal Formulation: With patient Time For Goal Achievement: 01/25/24 Potential to Achieve Goals: Good Progress towards PT goals: Progressing toward goals    Frequency    7X/week      PT Plan      Co-evaluation               AM-PAC PT 6 Clicks Mobility   Outcome Measure  Help needed turning from your back to your side while in a flat bed without using bedrails?: A Little Help needed moving from lying on your back to sitting on the side of a flat bed without using bedrails?: A Little Help needed moving to and from a bed to a chair (including a wheelchair)?: A Little Help needed standing up from a chair using your arms (e.g., wheelchair or bedside chair)?: A Little Help needed to walk in hospital room?: A Little Help needed climbing 3-5 steps with a railing? : A Little 6 Click Score: 18    End of Session Equipment Utilized During Treatment: Gait belt Activity Tolerance: Patient tolerated treatment well Patient left: in chair;with call bell/phone within reach Nurse Communication: Mobility status PT Visit Diagnosis: Difficulty in walking, not elsewhere classified (R26.2)     Time: 8671-8642 PT Time Calculation (min) (ACUTE ONLY): 29 min  Charges:    $Gait Training: 8-22 mins $Therapeutic Activity: 8-22 mins PT General Charges $$ ACUTE PT VISIT: 1 Visit                     [facility] PT Acute Rehabilitation Services Office [phone]    Michelle Kennedy 01/19/2024, 2:02 PM

## 2024-01-19 NOTE — Evaluation (Signed)
 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  Pt motivated and should progress well to dc home with family assist.  Pt reports follow up HHPT planned.        If plan is discharge home, recommend the following: A little help with walking and/or transfers;A little help with bathing/dressing/bathroom;Assistance with cooking/housework;Assist for transportation;Help with stairs or ramp for entrance   Can travel by private vehicle        Equipment Recommendations Rolling walker (2 wheels)  Recommendations for Other Services       Functional Status Assessment Patient has had a recent decline in their functional status and demonstrates the ability to make significant improvements in function in a reasonable and predictable amount of time.     Precautions / Restrictions Precautions Precautions: Fall;Knee Required Braces or Orthoses: Knee Immobilizer - Right Knee Immobilizer - Right: Discontinue once straight leg raise with < 10 degree lag (pt performed IND SLR this am) Restrictions Weight Bearing Restrictions Per Provider Order: Yes RLE Weight Bearing Per Provider Order: Weight bearing as tolerated      Mobility  Bed Mobility Overal bed mobility: Needs Assistance Bed Mobility: Supine to Sit     Supine to sit: Min assist     General bed mobility comments: INcreased time, use of bed rail, assist to manage R LE    Transfers Overall transfer level: Needs assistance Equipment used: Rolling walker (2 wheels) Transfers: Sit to/from Stand Sit to Stand: Min assist, Contact guard assist           General transfer comment: cues for LE management  and use of UEs to self assist    Ambulation/Gait Ambulation/Gait assistance: Min assist Gait Distance (Feet): 75 Feet Assistive device: Rolling walker (2 wheels) Gait Pattern/deviations: Step-to pattern, Shuffle, Decreased step length - right, Decreased step length - left, Trunk flexed Gait velocity: decr     General Gait Details: cues for sequence, posture and position from [employer]            Wheelchair Mobility     Tilt Bed    Modified Rankin (Stroke Patients Only)       Balance Overall balance assessment: Mild deficits observed, not formally tested                                           Pertinent Vitals/Pain Pain Assessment Pain Assessment: 0-10 Pain Score: 5  Pain Location: R knee Pain Descriptors / Indicators: Aching, Sore Pain Intervention(s): Limited activity within patient's tolerance, Monitored during session, Premedicated before session, Ice applied    Home Living Family/patient expects to be discharged to:: Private residence Living Arrangements: Spouse/significant other Available Help at Discharge: Family;Available 24 hours/day Type of Home: House Home Access: Stairs to enter   [employer] of Steps: 1   Home Layout: One level Home Equipment: Shower seat;Toilet riser;Grab bars - tub/shower;Grab bars - toilet;Cane - quad;Crutches;Adaptive equipment;Rollator (4 wheels)      Prior Function Prior Level of Function : Independent/Modified Independent;Driving  Mobility Comments: Ind no AD ADLs Comments: Ind     Extremity/Trunk Assessment   Upper Extremity Assessment Upper Extremity Assessment: Overall WFL for tasks assessed    Lower Extremity Assessment Lower Extremity Assessment: RLE deficits/detail RLE Deficits / Details: 3/5 quads with IND SLR;  AAROM at knee -4 - 55    Cervical / Trunk Assessment Cervical / Trunk Assessment: Normal  Communication   Communication Communication:  Impaired Factors Affecting Communication: Hearing impaired    Cognition Arousal: Alert Behavior During Therapy: WFL for tasks assessed/performed   PT - Cognitive impairments: No apparent impairments                         Following commands: Intact       Cueing Cueing Techniques: Verbal cues     General Comments      Exercises Total Joint Exercises Ankle Circles/Pumps: AROM, Both, 15 reps, Supine Quad Sets: AROM, Both, 10 reps, Supine Heel Slides: AAROM, Right, 15 reps, Supine Straight Leg Raises: AAROM, AROM, Right, 15 reps, Supine   Assessment/Plan    PT Assessment Patient needs continued PT services  PT Problem List Decreased strength;Decreased range of motion;Decreased activity tolerance;Decreased balance;Decreased mobility;Decreased knowledge of use of DME;Obesity;Pain       PT Treatment Interventions DME instruction;Gait training;Stair training;Functional mobility training;Therapeutic activities;Therapeutic exercise;Patient/family education    PT Goals (Current goals can be found in the Care Plan section)  Acute Rehab PT Goals Patient Stated Goal: Regain IND PT Goal Formulation: With patient Time For Goal Achievement: 01/25/24 Potential to Achieve Goals: Good    Frequency 7X/week     Co-evaluation               AM-PAC PT 6 Clicks Mobility  Outcome Measure Help needed turning from your back to your side while in a flat bed without using bedrails?: A Little Help needed moving from lying on your back to sitting on the side of a flat bed without using bedrails?: A Little Help needed moving to and from a bed to a chair (including a wheelchair)?: A Little Help needed standing up from a chair using your arms (e.g., wheelchair or bedside chair)?: A Little Help needed to walk in hospital room?: A Little Help needed climbing 3-5 steps with a railing? : A Little 6 Click Score: 18    End of Session Equipment Utilized During Treatment: Gait  belt Activity Tolerance: Patient tolerated treatment well Patient left: in chair;with call bell/phone within reach Nurse Communication: Mobility status PT Visit Diagnosis: Difficulty in walking, not elsewhere classified (R26.2)    Time: 9148-9064 PT Time Calculation (min) (ACUTE ONLY): 44 min   Charges:   PT Evaluation $PT Eval Low Complexity: 1 Low PT Treatments $Gait Training: 8-22 mins $Therapeutic Exercise: 8-22 mins PT General Charges $$ ACUTE PT VISIT: 1 Visit         [facility] PT Acute Rehabilitation Services Office [phone]   [facility] 01/19/2024, 10:29 AM

## 2024-01-19 NOTE — Progress Notes (Signed)
 Subjective: 1 Day Post-Op Procedure(s) (LRB): ARTHROPLASTY, KNEE, TOTAL (Right) Patient reports pain as moderate.    Objective: Vital signs in last 24 hours: Temp:  [96.2 F (35.7 C)-98.8 F (37.1 C)] 98.8 F (37.1 C) (08/30 0954) Pulse Rate:  [61-84] 83 (08/30 0954) Resp:  [12-20] 16 (08/30 0954) BP: (95-152)/(53-99) 152/74 (08/30 0954) SpO2:  [93 %-100 %] 95 % (08/30 0954) Weight:  [115.2 kg] 115.2 kg (08/29 1357)  Intake/Output from previous day: 08/29 0701 - 08/30 0700 In: 904.7 [I.V.:887.3; IV Piggyback:17.4] Out: 1825 [Urine:1800; Blood:25] Intake/Output this shift: Total I/O In: 240 [P.O.:240] Out: -   Recent Labs    01/19/24 0417  HGB 11.0*   Recent Labs    01/19/24 0417  WBC 11.8*  RBC 4.56  HCT 36.6  PLT 250   Recent Labs    01/19/24 0417  NA 143  K 5.0  CL 108  CO2 23  BUN 16  CREATININE 0.83  GLUCOSE 115*  CALCIUM  9.0   No results for input(s): LABPT, INR in the last 72 hours.  Sensation intact distally Intact pulses distally Dorsiflexion/Plantar flexion intact Incision: dressing C/D/I Compartment soft   Assessment/Plan: 1 Day Post-Op Procedure(s) (LRB): ARTHROPLASTY, KNEE, TOTAL (Right) Up with therapy Discharge home with home health      [name] 01/19/2024, 11:25 AM

## 2024-01-20 NOTE — Discharge Summary (Signed)
 Patient ID: Michelle Kennedy MRN: [id number] DOB/AGE: [birth year] 68 y.o.  Admit date: 01/18/2024 Discharge date: 01/09/2024  Admission Diagnoses:  Principal Problem:   Unilateral primary osteoarthritis, right knee Active Problems:   Status post total right knee replacement   Discharge Diagnoses:  Same  Past Medical History:  Diagnosis Date   Abnormally small mouth    Achilles tendon disorder, right    Allergy    Anemia    Anxiety    Arthritis    hips and knees   Asthma    daily inhaler, prn inhaler and neb.   Basal cell carcinoma    nose, s/p mohs.   Breast cancer (HCC) 01/2014   left   Cataract    bilateral - surgery   CHF (congestive heart failure) (HCC)    COPD (chronic obstructive pulmonary disease) (HCC)    Dental bridge present    upper front and lower right   Dental crowns present    x 3   Depression    Esophageal spasm    reports since her chemo and breast surgery  she developed esophageal spasms and reports this is in the past has casue her 02 to desat in the  70s , denies sycnope in relation to this , does report hx of vertigo as well    Family history of anesthesia complication    twin brother aspirated and died on OR table, per pt.   Fibromyalgia    Gallbladder disease    Gallstones    GERD (gastroesophageal reflux disease)    Glaucoma    H/O blood clots    had blood to  PICC line    History of gastric ulcer    as a teenager   History of seizure age 81   as a reaction to Penicillin - no seizures since   History of thyroid  cancer    s/p thyroidectomy   HTN (hypertension)    Hyperlipidemia    Hypothyroidism    Joint pain    Liver disorder    seen when she had her hysterectomy , reports she was told it was   small lesion ; but asymptomatic    Lymphedema    left arm   Migraines    Multiple food allergies    Obesity    OSA (obstructive sleep apnea) 02/16/2016   CPAP- uses nightly   Palpitations    reports no longer experiences   Personal  history of chemotherapy 2015   Personal history of radiation therapy 2015   Left   Pneumonia    x 1 years ago   Pre-diabetes    Rheumatoid arthritis (HCC)    SOB (shortness of breath)    Swelling of both ankles    Tinnitus    UTI (lower urinary tract infection) 02/17/2014   Vertigo    Vitamin D  deficiency    Wears hearing aid in both ears    hearing loss    Surgeries: Procedure(s): ARTHROPLASTY, KNEE, TOTAL on 01/18/2024   Consultants:   Discharged Condition: Improved  Hospital Course: Michelle Kennedy is an 68 y.o. female who was admitted 01/18/2024 for operative treatment ofUnilateral primary osteoarthritis, right knee. Patient has severe unremitting pain that affects sleep, daily activities, and work/hobbies. After pre-op clearance the patient was taken to the operating room on 01/18/2024 and underwent  Procedure(s): ARTHROPLASTY, KNEE, TOTAL.    Patient was given perioperative antibiotics:  Anti-infectives (From admission, onward)    Start     Dose/Rate Route Frequency  Ordered Stop   01/18/24 1600  ceFAZolin  (ANCEF ) IVPB 2g/100 mL premix        2 g 200 mL/hr over 30 Minutes Intravenous Every 6 hours 01/18/24 1233 01/18/24 2149   01/18/24 0815  ceFAZolin  (ANCEF ) IVPB 2g/100 mL premix        2 g 200 mL/hr over 30 Minutes Intravenous On call to O.R. 01/18/24 0800 01/18/24 0957        Patient was given sequential compression devices, early ambulation, and chemoprophylaxis to prevent DVT.  Inpatient Morphine Milligram Equivalents Per Day 8/29 - 8/30   Values displayed are in units of MME/Day    Order Start / End Date 8/29 Yesterday    oxyCODONE  (Oxy IR/ROXICODONE ) immediate release tablet 5 mg 8/29 - 8/29 0 of Unknown --    oxyCODONE  (ROXICODONE ) 5 MG/5ML solution 5 mg 8/29 - 8/29 0 of Unknown --      Group total: 0 of Unknown     fentaNYL  (SUBLIMAZE ) injection 25-50 mcg 8/29 - 8/29 0 of 45-90 --    fentaNYL  (SUBLIMAZE ) injection 50-100 mcg 8/29 - 8/29 15 of 15-30 --     HYDROmorphone  (DILAUDID ) injection 0.5-1 mg 8/29 - 8/30 0 of 40-80 0 of 50-100    oxyCODONE  (Oxy IR/ROXICODONE ) immediate release tablet 5-10 mg 8/29 - 8/30 15 of 30-60 30 of 37.5-75    oxyCODONE  (Oxy IR/ROXICODONE ) immediate release tablet 10-15 mg 8/29 - 8/30 15 of 60-90 15 of 75-112.5    Daily Totals  45 of Unknown (at least 190-350) 45 of 162.5-287.5    Calculation Errors     Order Type Date Details   oxyCODONE  (Oxy IR/ROXICODONE ) immediate release tablet 5 mg Ordered Dose -- Insufficient frequency information   oxyCODONE  (ROXICODONE ) 5 MG/5ML solution 5 mg Ordered Dose -- Insufficient frequency information            Patient benefited maximally from hospital stay and there were no complications.    Recent vital signs: No data found.   Recent laboratory studies:  Recent Labs    01/19/24 0417  WBC 11.8*  HGB 11.0*  HCT 36.6  PLT 250  NA 143  K 5.0  CL 108  CO2 23  BUN 16  CREATININE 0.83  GLUCOSE 115*  CALCIUM  9.0     Discharge Medications:   Allergies as of 01/19/2024       Reactions   Bee Venom Anaphylaxis   Betadine  [povidone-iodine ] Shortness Of Breath, Rash   Patient reports during pre-op call that she is severely allergic to Betadine  skin prep   Codeine Shortness Of Breath, Rash   Contrast Media [iodinated Contrast Media] Shortness Of Breath   Gadolinium Derivatives Hives, Rash, Shortness Of Breath, Swelling   my heart stopped beating   Latex Anaphylaxis, Rash   Penicillins Shortness Of Breath, Rash, Other (See Comments)   SEIZURE   Pentazocine Lactate Shortness Of Breath, Rash   Shellfish Allergy Shortness Of Breath, Rash   Aspirin Rash, Other (See Comments)   GI UPSET   Erythromycin Swelling, Rash   SWELLING OF JOINTS   Symbicort  [budesonide -formoterol  Fumarate] Other (See Comments)   BURNING OF TONGUE AND LIPS   Lidocaine     Low blood pressure during back injection, patient denies respiratory symptoms, rash, airway compromise, or  swelling. Used IV and for skin infiltration on 01/18/24 uneventfully.   Compazine  [prochlorperazine  Maleate] Rash   Rash on face,chest, arms, back   Sulfonamide Derivatives Rash        Medication List  STOP taking these medications    traMADol  50 MG tablet Commonly known as: ULTRAM        TAKE these medications    albuterol  108 (90 Base) MCG/ACT inhaler Commonly known as: VENTOLIN  HFA Inhale 2 puffs into the lungs every 6 (six) hours as needed for wheezing or shortness of breath.   albuterol  (2.5 MG/3ML) 0.083% nebulizer solution Commonly known as: PROVENTIL  Take 3 mLs (2.5 mg total) by nebulization every 6 (six) hours as needed for wheezing or shortness of breath.   amLODipine  5 MG tablet Commonly known as: NORVASC  Take 1 tablet (5 mg total) by mouth daily.   apixaban  2.5 MG Tabs tablet Commonly known as: ELIQUIS  Take 1 tablet (2.5 mg total) by mouth every 12 (twelve) hours.   CALCIUM  PO Take 1 tablet by mouth every evening.   cholecalciferol  25 MCG (1000 UNIT) tablet Commonly known as: VITAMIN D3 Take 1,000 Units by mouth every evening.   clopidogrel  75 MG tablet Commonly known as: PLAVIX  TAKE 1 TABLET BY MOUTH DAILY   EPINEPHrine  0.3 mg/0.3 mL Soaj injection Commonly known as: EPI-PEN Inject 0.3 mg into the muscle as needed for anaphylaxis.   Fish Oil 1000 MG Caps Take 1,000 mg by mouth every evening.   fluticasone -salmeterol 500-50 MCG/ACT Aepb Commonly known as: Wixela Inhub Inhale 1 puff into the lungs in the morning and at bedtime.   furosemide  20 MG tablet Commonly known as: LASIX  TAKE 1 TABLET BY MOUTH DAILY AS  NEEDED FOR SWELLING   gabapentin  300 MG capsule Commonly known as: NEURONTIN  Take 1 capsule (300 mg total) by mouth 3 (three) times daily. What changed: when to take this   ipratropium-albuterol  0.5-2.5 (3) MG/3ML Soln Commonly known as: DUONEB Take 3 mLs by nebulization every 6 (six) hours.   latanoprost  0.005 % ophthalmic  solution Commonly known as: XALATAN  Place 1 drop into both eyes at bedtime.   levothyroxine  150 MCG tablet Commonly known as: SYNTHROID  Take 1 tablet (150 mcg total) by mouth daily.   lisinopril  20 MG tablet Commonly known as: ZESTRIL  TAKE 2 TABLETS BY MOUTH DAILY   meclizine  25 MG tablet Commonly known as: ANTIVERT  Take 1 tablet (25 mg total) by mouth 3 (three) times daily as needed for dizziness.   metFORMIN  500 MG tablet Commonly known as: GLUCOPHAGE  TAKE 2 TABLETS BY MOUTH IN THE MORNING AND 2 TABLETS IN THE EVENING   methocarbamol  500 MG tablet Commonly known as: ROBAXIN  Take 500 mg by mouth 4 (four) times daily as needed for muscle spasms.   montelukast  10 MG tablet Commonly known as: SINGULAIR  TAKE 1 TABLET BY MOUTH AT  BEDTIME What changed: when to take this   Multivitamin Adult Tabs Take 1 tablet by mouth daily.   NON FORMULARY Pt uses a c-pap nightly   ondansetron  4 MG disintegrating tablet Commonly known as: ZOFRAN -ODT Take 1 tablet (4 mg total) by mouth every 8 (eight) hours as needed.   oxyCODONE  5 MG immediate release tablet Commonly known as: Oxy IR/ROXICODONE  Take 1-2 tablets (5-10 mg total) by mouth every 6 (six) hours as needed for moderate pain (pain score 4-6) (pain score 4-6). No more than 6 tablets per day.   pantoprazole  40 MG tablet Commonly known as: PROTONIX  Take 1 tablet (40 mg total) by mouth daily.   rosuvastatin  5 MG tablet Commonly known as: CRESTOR  TAKE 1 TABLET BY MOUTH DAILY What changed: when to take this   tiZANidine  2 MG tablet Commonly known as: ZANAFLEX  Take 1 tablet (  2 mg total) by mouth every 6 (six) hours as needed for muscle spasms.        Diagnostic Studies: DG Knee Right Port Result Date: 01/18/2024 CLINICAL DATA:  Status post right knee arthroplasty. EXAM: PORTABLE RIGHT KNEE - 1-2 VIEW COMPARISON:  11/19/2023 FINDINGS: Postoperative radiographs demonstrate normal alignment of a right total knee arthroplasty  with normal appearance of femoral and tibial arthroplasty components. Expected air in the joint space postoperatively. No bony abnormalities. IMPRESSION: Normal alignment of right total knee arthroplasty. Electronically Signed   By: Marcey Moan M.D.   On: 01/18/2024 15:33   MM 3D DIAGNOSTIC MAMMOGRAM UNILATERAL LEFT BREAST Result Date: 12/26/2023 CLINICAL DATA:  Patient presents with a new left breast lump associated with mild tenderness. She has a history of a left lumpectomy for breast carcinoma performed in 2015 with adjuvant chemotherapy and radiation therapy. EXAM: DIGITAL DIAGNOSTIC UNILATERAL LEFT MAMMOGRAM WITH TOMOSYNTHESIS AND CAD; ULTRASOUND LEFT BREAST LIMITED TECHNIQUE: Left digital diagnostic mammography and breast tomosynthesis was performed. The images were evaluated with computer-aided detection. ; Targeted ultrasound examination of the left breast was performed. COMPARISON:  Previous exam(s). ACR Breast Density Category b: There are scattered areas of fibroglandular density. FINDINGS: There are stable post lumpectomy changes in the upper breast. There are no breast masses, areas of nonsurgical architectural distortion or suspicious calcifications. A stable cylinder shaped post biopsy marker clip lies in the upper outer breast anterior to the lumpectomy site. No mammographic abnormalities seen in the area of the reported palpable lump. On physical exam, there is an area of prominent, firm tissue in the upper slightly inner aspect of the left breast without a defined mass. Targeted ultrasound is performed, showing normal fibroglandular tissue in the upper inner left breast in the area of the reported palpable lump. No mass or suspicious finding. IMPRESSION: 1. No evidence of breast malignancy. 2. Stable benign post lumpectomy changes in the upper left breast. RECOMMENDATION: 1. Annual screening mammography.  Last exam performed on 01/24/2023. I have discussed the findings and recommendations  with the patient. If applicable, a reminder letter will be sent to the patient regarding the next appointment. BI-RADS CATEGORY  2: Benign. Electronically Signed   By: Alm Parkins M.D.   On: 12/26/2023 14:44   US  LIMITED ULTRASOUND INCLUDING AXILLA LEFT BREAST  Result Date: 12/26/2023 CLINICAL DATA:  Patient presents with a new left breast lump associated with mild tenderness. She has a history of a left lumpectomy for breast carcinoma performed in 2015 with adjuvant chemotherapy and radiation therapy. EXAM: DIGITAL DIAGNOSTIC UNILATERAL LEFT MAMMOGRAM WITH TOMOSYNTHESIS AND CAD; ULTRASOUND LEFT BREAST LIMITED TECHNIQUE: Left digital diagnostic mammography and breast tomosynthesis was performed. The images were evaluated with computer-aided detection. ; Targeted ultrasound examination of the left breast was performed. COMPARISON:  Previous exam(s). ACR Breast Density Category b: There are scattered areas of fibroglandular density. FINDINGS: There are stable post lumpectomy changes in the upper breast. There are no breast masses, areas of nonsurgical architectural distortion or suspicious calcifications. A stable cylinder shaped post biopsy marker clip lies in the upper outer breast anterior to the lumpectomy site. No mammographic abnormalities seen in the area of the reported palpable lump. On physical exam, there is an area of prominent, firm tissue in the upper slightly inner aspect of the left breast without a defined mass. Targeted ultrasound is performed, showing normal fibroglandular tissue in the upper inner left breast in the area of the reported palpable lump. No mass or suspicious finding. IMPRESSION:  1. No evidence of breast malignancy. 2. Stable benign post lumpectomy changes in the upper left breast. RECOMMENDATION: 1. Annual screening mammography.  Last exam performed on 01/24/2023. I have discussed the findings and recommendations with the patient. If applicable, a reminder letter will be sent to  the patient regarding the next appointment. BI-RADS CATEGORY  2: Benign. Electronically Signed   By: Alm Parkins M.D.   On: 12/26/2023 14:44    Disposition: Discharge disposition: 01-Home or Self Care          Follow-up Information     Vernetta Lonni GRADE, MD Follow up in 2 week(s).   Specialty: Orthopedic Surgery Contact information: [address] [place]  [place] [address] [phone]                  Signed: Lonni GRADE Vernetta 01/20/2024, 5:16 PM

## 2024-01-22 ENCOUNTER — Encounter (HOSPITAL_COMMUNITY): Payer: Self-pay | Admitting: Orthopaedic Surgery

## 2024-01-22 ENCOUNTER — Other Ambulatory Visit: Payer: Self-pay | Admitting: *Deleted

## 2024-01-22 DIAGNOSIS — Z96651 Presence of right artificial knee joint: Secondary | ICD-10-CM

## 2024-01-22 DIAGNOSIS — M1711 Unilateral primary osteoarthritis, right knee: Secondary | ICD-10-CM

## 2024-01-25 ENCOUNTER — Other Ambulatory Visit: Payer: Self-pay | Admitting: Orthopaedic Surgery

## 2024-01-25 ENCOUNTER — Telehealth: Payer: Self-pay | Admitting: *Deleted

## 2024-01-25 MED ORDER — GABAPENTIN 300 MG PO CAPS: 300.0000 mg | ORAL_CAPSULE | Freq: Two times a day (BID) | ORAL | 1 refills | Status: DC

## 2024-01-25 MED ORDER — OXYCODONE HCL 5 MG PO TABS: 5.0000 mg | ORAL_TABLET | Freq: Four times a day (QID) | ORAL | 0 refills | Status: DC | PRN

## 2024-01-25 MED ORDER — TIZANIDINE HCL 2 MG PO TABS: 2.0000 mg | ORAL_TABLET | Freq: Four times a day (QID) | ORAL | 0 refills | Status: DC | PRN

## 2024-01-25 NOTE — Addendum Note (Signed)
 Addended by: DARYL SETTER on: 01/25/2024 01:04 PM   Modules accepted: Orders

## 2024-01-25 NOTE — Telephone Encounter (Signed)
Patient called requesting refill of pain medication and muscle relaxer. Thank you.

## 2024-01-31 ENCOUNTER — Ambulatory Visit: Admitting: Orthopaedic Surgery

## 2024-01-31 ENCOUNTER — Encounter: Payer: Self-pay | Admitting: Orthopaedic Surgery

## 2024-01-31 DIAGNOSIS — Z96651 Presence of right artificial knee joint: Secondary | ICD-10-CM

## 2024-01-31 MED ORDER — OXYCODONE HCL 5 MG PO TABS: 5.0000 mg | ORAL_TABLET | Freq: Four times a day (QID) | ORAL | 0 refills | Status: DC | PRN

## 2024-01-31 NOTE — Progress Notes (Signed)
 The patient is here for first postoperative visit today 2 weeks status post a right total knee arthroplasty to treat significant right knee pain and arthritis.  She does have a remote history of a left total knee replacement done elsewhere.  She states that she has been able to get her knee flexed to about 90 degrees now.  She is still waiting to hear from outpatient physical therapy.  They were trying to set this up at [facility] and she may have to do it in [place].  She has been on Eliquis  twice a day at a low dose for blood thinning purposes given history of blood clots.  On exam her right knee incision looks good and staples have been removed and Steri-Strips applied.  Her calf is soft.  Her extension is pretty good as well as her flexion for first visit.  We will send in some more oxycodone  for her.  She is having a significant amount of trouble sleeping at night which is common after knee replacement surgery.  From our standpoint we will see him back in 4 weeks to see how she is doing overall but no x-rays are needed.

## 2024-02-03 ENCOUNTER — Other Ambulatory Visit: Payer: Self-pay | Admitting: Family

## 2024-02-06 ENCOUNTER — Ambulatory Visit: Attending: Orthopaedic Surgery | Admitting: Rehabilitative and Restorative Service Providers"

## 2024-02-06 ENCOUNTER — Encounter: Payer: Self-pay | Admitting: Rehabilitative and Restorative Service Providers"

## 2024-02-06 ENCOUNTER — Other Ambulatory Visit: Payer: Self-pay

## 2024-02-06 DIAGNOSIS — M1711 Unilateral primary osteoarthritis, right knee: Secondary | ICD-10-CM | POA: Diagnosis not present

## 2024-02-06 DIAGNOSIS — Z96651 Presence of right artificial knee joint: Secondary | ICD-10-CM | POA: Insufficient documentation

## 2024-02-06 DIAGNOSIS — M6281 Muscle weakness (generalized): Secondary | ICD-10-CM | POA: Diagnosis present

## 2024-02-06 DIAGNOSIS — M25561 Pain in right knee: Secondary | ICD-10-CM | POA: Diagnosis present

## 2024-02-06 DIAGNOSIS — R29898 Other symptoms and signs involving the musculoskeletal system: Secondary | ICD-10-CM | POA: Insufficient documentation

## 2024-02-06 DIAGNOSIS — R252 Cramp and spasm: Secondary | ICD-10-CM | POA: Diagnosis present

## 2024-02-06 DIAGNOSIS — R2689 Other abnormalities of gait and mobility: Secondary | ICD-10-CM | POA: Diagnosis present

## 2024-02-06 NOTE — Therapy (Signed)
 OUTPATIENT PHYSICAL THERAPY LOWER EXTREMITY EVALUATION   Patient Name: Michelle Kennedy MRN: [id number] DOB:January 24, 1956, 68 y.o., female Today's Date: 02/06/2024  END OF SESSION:  PT End of Session - 02/06/24 1115     Visit Number 1    Number of Visits 16    Date for PT Re-Evaluation 04/02/24    Authorization Type insurance not available    PT Start Time 0930    PT Stop Time 1015    PT Time Calculation (min) 45 min    Activity Tolerance Patient tolerated treatment well          Past Medical History:  Diagnosis Date   Abnormally small mouth    Achilles tendon disorder, right    Allergy    Anemia    Anxiety    Arthritis    hips and knees   Asthma    daily inhaler, prn inhaler and neb.   Basal cell carcinoma    nose, s/p mohs.   Breast cancer (HCC) 01/2014   left   Cataract    bilateral - surgery   CHF (congestive heart failure) (HCC)    COPD (chronic obstructive pulmonary disease) (HCC)    Dental bridge present    upper front and lower right   Dental crowns present    x 3   Depression    Esophageal spasm    reports since her chemo and breast surgery  she developed esophageal spasms and reports this is in the past has casue her 02 to desat in the  70s , denies sycnope in relation to this , does report hx of vertigo as well    Family history of anesthesia complication    twin brother aspirated and died on OR table, per pt.   Fibromyalgia    Gallbladder disease    Gallstones    GERD (gastroesophageal reflux disease)    Glaucoma    H/O blood clots    had blood to  PICC line    History of gastric ulcer    as a teenager   History of seizure age 78   as a reaction to Penicillin - no seizures since   History of thyroid  cancer    s/p thyroidectomy   HTN (hypertension)    Hyperlipidemia    Hypothyroidism    Joint pain    Liver disorder    seen when she had her hysterectomy , reports she was told it was   small lesion ; but asymptomatic    Lymphedema     left arm   Migraines    Multiple food allergies    Obesity    OSA (obstructive sleep apnea) 02/16/2016   CPAP- uses nightly   Palpitations    reports no longer experiences   Personal history of chemotherapy 2015   Personal history of radiation therapy 2015   Left   Pneumonia    x 1 years ago   Pre-diabetes    Rheumatoid arthritis (HCC)    SOB (shortness of breath)    Swelling of both ankles    Tinnitus    UTI (lower urinary tract infection) 02/17/2014   Vertigo    Vitamin D  deficiency    Wears hearing aid in both ears    hearing loss   Past Surgical History:  Procedure Laterality Date   ABDOMINAL HYSTERECTOMY  2004   complete   ACHILLES TENDON REPAIR Right    APPENDECTOMY  2004   BACK SURGERY  02/09/2023   at the  [facility]   bilateral eye surgery Bilateral 2022   glaucoma   bladder stimulator  08/29/2022   Medtronic bladder stinulator - with remote   BREAST EXCISIONAL BIOPSY     BREAST LUMPECTOMY Left    2015   BREAST LUMPECTOMY WITH NEEDLE LOCALIZATION AND AXILLARY SENTINEL LYMPH NODE BX Left 02/23/2014   Procedure: BREAST LUMPECTOMY WITH NEEDLE LOCALIZATION AND AXILLARY SENTINEL LYMPH NODE BIOPSY;  Surgeon: Morene Olives, MD;  Location: [facility];  Service: General;  Laterality: Left;   BREAST SURGERY  2011   left breast biposy   CARPAL TUNNEL RELEASE Bilateral    CHOLECYSTECTOMY  1990   COLONOSCOPY W/ POLYPECTOMY  06/2009   DECOMPRESSIVE LUMBAR LAMINECTOMY LEVEL 1 N/A 06/22/2023   Procedure: LUMBAR FOUR/FIVE LAMINECTOMY WITH FACET CYST EXCISION;  Surgeon: Georgina Ozell LABOR, MD;  Location: [facility];  Service: Orthopedics;  Laterality: N/A;   EYE SURGERY Right 2013   exc. warts from underneath eyelid   INCONTINENCE SURGERY  2004   KNEE ARTHROSCOPY Bilateral    x 6 each knee   LIGAMENT REPAIR Right    thumb/wrist   ORIF TOE FRACTURE Right    great toe   PORTACATH PLACEMENT N/A 03/24/2014   Procedure: INSERTION PORT-A-CATH;   Surgeon: Morene Olives, MD;  Location: [facility];  Service: General;  Laterality: N/A;; removed    THYROIDECTOMY  2000   TONSILLECTOMY AND ADENOIDECTOMY  2000   TOTAL KNEE ARTHROPLASTY Left 12/03/2017   Procedure: LEFT TOTAL KNEE ARTHROPLASTY;  Surgeon: Melodi Lerner, MD;  Location: [facility];  Service: Orthopedics;  Laterality: Left;   TOTAL KNEE ARTHROPLASTY Right 01/18/2024   Procedure: ARTHROPLASTY, KNEE, TOTAL;  Surgeon: Vernetta Lonni GRADE, MD;  Location: [facility];  Service: Orthopedics;  Laterality: Right;   TUBAL LIGATION  1981   TUMOR EXCISION     from thoracic spine   Patient Active Problem List   Diagnosis Date Noted   Status post total right knee replacement 01/18/2024   Asthma exacerbation 09/28/2023   Acute asthma exacerbation 09/27/2023   Chronic pain syndrome 09/27/2023   Hypothyroidism 09/27/2023   History of CVA (cerebrovascular accident) 2017 09/27/2023   History of DVT (deep vein thrombosis) of right upper extremity 2015 09/27/2023   Status post laminectomy 06/22/2023   Cyst of lumbar facet joint 06/22/2023   Pre-op evaluation 06/05/2023   Status post insertion of nerve stimulator 03/19/2023   History of thyroid  cancer    SOB (shortness of breath) on exertion 10/03/2022   Polyphagia 04/27/2022   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   Abdominal pannus 03/02/2022   Vertigo 02/07/2022   Peroneal tendinitis of left lower extremity 02/07/2022   Visceral obesity 02/01/2022   Sacral pain 09/20/2021   Pain in right knee 09/13/2021   Chronic left-sided low back pain with left-sided sciatica 04/27/2021   Nondisplaced fracture of middle phalanx of left lesser toe(s), initial encounter for closed fracture 09/17/2020   Chronic diastolic CHF (congestive heart failure) (HCC) 08/23/2019   Retinal artery occlusion 08/22/2019   History of total left knee replacement 12/07/2017   Unilateral primary osteoarthritis, right knee 12/03/2017   Morbid obesity (HCC)  01/01/2017   Essential hypertension 10/18/2016   Constipation 10/04/2016   Vitamin D  deficiency 10/04/2016   Prediabetes 10/04/2016   BMI 39.0-39.9,adult 09/20/2016   OSA on CPAP 02/16/2016   Malignant neoplasm of thyroid  gland (HCC) 10/11/2015   De Quervain's tenosynovitis, bilateral 09/15/2015   Trigger finger, acquired 06/22/2015   Chemotherapy-induced  peripheral neuropathy (HCC) 05/10/2015   Postmenopausal osteoporosis 12/08/2014   Preventative health care 12/01/2014   DVT (deep venous thrombosis) (HCC) 08/28/2014   Lymphedema of arm 08/28/2014   Mucositis due to chemotherapy 06/02/2014   Menopausal syndrome (hot flashes) 12/31/2013   Breast cancer of upper-outer quadrant of left female breast (HCC) 12/26/2013   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   Gingivitis 12/05/2013   Seasonal allergies 09/24/2013   Hyperlipidemia 06/02/2011   Migraine 06/02/2011   Tinnitus of both ears 02/14/2011   Fibromyalgia 05/10/2010   Asthma 03/22/2010   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   GERD 12/20/2009   Depression 12/01/2009    PCP: Eleanor Ponto, NP  REFERRING PROVIDER: Dr Lonni Poli  REFERRING DIAG: R TKA  THERAPY DIAG:  Acute pain of right knee - Plan: PT plan of care cert/re-cert  Cramp and spasm - Plan: PT plan of care cert/re-cert  Muscle weakness (generalized) - Plan: PT plan of care cert/re-cert  Other symptoms and signs involving the musculoskeletal system - Plan: PT plan of care cert/re-cert  Other abnormalities of gait and mobility  Rationale for Evaluation and Treatment: Rehabilitation  ONSET DATE: 01/18/24  SUBJECTIVE:   SUBJECTIVE STATEMENT: Patient reports that she has had pain in the R knee for years with bone spurs and bone on bone joint. She was treated for some time with injections but decided to undergo elective R TKA on 8.29.25. no post op complications.   PERTINENT HISTORY: L TKA ~ 10 yrs ago; Malignant  neoplasm L breast; achilles tendon disorder; anxiety; arthritis; asthma; cataract; CHF; COPD; fibromyapgia; gallbladder disease; history of blood clots; gastric ulcer; seizure; HTNhyperlipidemia; liver disorder; lymphedema L arm; migraines; obesity; OSA; prediabetic; swelling both ankles; tinnitus; vertigo PAIN:  Are you having pain? Yes: NPRS scale: 3-4/10  Pain location: R knee  Pain description: stiff; hurts Aggravating factors: standing > 10 min; walking > 1500 steps; late at night; awakens with pain at times Relieving factors: OTC meds; ice; heat on thigh   PRECAUTIONS: Other: hearing impaired reads lips; history of blood clots   RED FLAGS: none     WEIGHT BEARING RESTRICTIONS: No  FALLS:  Has patient fallen in last 6 months? 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  DIAGNOSTIC FINDINGS: xray R knee 01/18/24 IMPRESSION: Normal alignment of right total knee arthroplasty.  PATIENT SURVEYS:  LEFS  Extreme difficulty/unable (0), Quite a bit of difficulty (1), Moderate difficulty (2), Little difficulty (3), No difficulty (4) Survey date:    Any of your usual work, housework or school activities 0  2. Usual hobbies, recreational or sporting activities 0  3. Getting into/out of the bath 3  4. Walking between rooms 3  5. Putting on socks/shoes 3  6. Squatting  0  7. Lifting an object, like a bag of  groceries from the floor 0  8. Performing light activities around your home 1  9. Performing heavy  activities around your home 0  10. Getting into/out of a car 3  11. Walking 2 blocks 0  12. Walking 1 mile 0  13. Going up/down 10 stairs (1 flight) 0  14. Standing for 1 hour 0  15.  sitting for 1 hour 0  16. Running on even ground 0  17. Running on uneven ground 0  18. Making sharp turns while running fast 0  19. Hopping  0  20. Rolling over in bed   Score total:  15     COGNITION: Overall cognitive status: Within functional limits for tasks assessed     SENSATION: Numbness in surgical site and tingling R knee   EDEMA:  Moderate edema R knee   MUSCLE LENGTH: Hamstrings: Right 80 deg; Left 85 deg  POSTURE: rounded shoulders, forward head, flexed trunk , and weight shift left   LOWER EXTREMITY ROM:  Active ROM Right eval Left eval  Hip flexion 110 110  Hip extension 0 0  Hip abduction    Hip adduction    Hip internal rotation    Hip external rotation    Knee flexion 81 115  Knee extension -10 0  Ankle dorsiflexion    Ankle plantarflexion    Ankle inversion    Ankle eversion     (Blank rows = not tested)  LOWER EXTREMITY MMT: strength not tested resistively - moving R LE well against gravity   MMT Right eval Left eval  Hip flexion    Hip extension    Hip abduction    Hip adduction    Hip internal rotation    Hip external rotation    Knee flexion    Knee extension    Ankle dorsiflexion    Ankle plantarflexion    Ankle inversion    Ankle eversion     (Blank rows = not tested)   GAIT: Distance walked: 40 feet  Assistive device utilized: Single point cane Level of assistance: Complete Independence Comments: decreased wt bearing R LE in stance to toe off R LE                                                                                                                                 TREATMENT DATE: POC; HEP     PATIENT EDUCATION:  Education  details: POC; HEP  Person educated: Patient Education method: Programmer, multimedia, Facilities manager, Actor cues, Verbal cues, and Handouts Education comprehension: verbalized understanding, returned demonstration, verbal cues required, tactile cues required, and needs further education  HOME EXERCISE PROGRAM: Access Code: [email or web address] URL: [email or web address] Date: 02/06/2024 Prepared by: Davita Sublett  Exercises - Supine Quad Set  - 1-2 x daily - 7 x weekly - 1 sets - 10 reps - 3 sec  hold - Small Range Straight Leg Raise  - 1-2 x daily - 7 x weekly - 1 sets - 10 reps - 5 sec  hold - Straight Leg Raise with External Rotation  -  1-2 x daily - 7 x weekly - 1 sets - 10 reps - 3-5 sec  hold - Supine Knee Extension Strengthening  - 1-2 x daily - 7 x weekly - 1-2 sets - 10 reps - 5 sec  hold - Supine Hip Adduction Isometric with Ball  - 1-2 x daily - 7 x weekly - 1 sets - 10 reps - 10 sec  hold - Supine Heel Slide with Strap  - 1-2 x daily - 7 x weekly - 1 sets - 5-10 reps - 10 sec  hold - Sidelying Hip Abduction  - 1 x daily - 7 x weekly - 3 sets - 10 reps - 3-5 sec  hold - Seated Heel Slide  - 1-2 x daily - 7 x weekly - 1 sets - 10 reps - 3 sec  hold  ASSESSMENT:  CLINICAL IMPRESSION: Patient is a 68 y.o. female who was seen today for physical therapy evaluation and treatment s/p R TKA 01/18/24 with no complications. She presents with abnormal gait pattern; decreased strength, mobility, ROM R LE; moderate edema R knee. Patient will benefit from PT to address problems identified and return to all normal functional and recreational activities.    OBJECTIVE IMPAIRMENTS: Abnormal gait, decreased activity tolerance, decreased balance, decreased ROM, decreased strength, increased edema, impaired flexibility, obesity, and pain.   ACTIVITY LIMITATIONS: carrying, lifting, bending, sitting, standing, squatting, sleeping, stairs, and transfers  PARTICIPATION LIMITATIONS: meal prep, cleaning, laundry,  driving, shopping, and community activity  PERSONAL FACTORS: Fitness, Past/current experiences, and comorbidities as noted in history above are also affecting patient's functional outcome.   REHAB POTENTIAL: Good  CLINICAL DECISION MAKING: Evolving/moderate complexity  EVALUATION COMPLEXITY: Moderate   GOALS: Goals reviewed with patient? Yes  SHORT TERM GOALS: Target date: 03/05/2024   Independent in initial HEP  Baseline: Goal status: INITIAL  2.  Increase R knee ROM to -5 deg extension to 95 deg flexion  Baseline:  Goal status: INITIAL  3.  Improved gait pattern with equal wt bearing LE's Baseline:  Goal status: INITIAL   LONG TERM GOALS: Target date: 04/02/2024   Increase strength R LE to 4+/5 to 5/5  Baseline: moves LE well against gravity at least 3/5 Goal status: INITIAL  2.  Increase R knee ROM to 0 deg extension and 100-105 deg flexion  Baseline:  Goal status: INITIAL  3.  Independent ambulation without assistive device for household walking  Baseline:  Goal status: INITIAL  4.  Improve LEFS score by 10 points  Baseline:  Goal status: INITIAL  5.  Independent in HEP  Baseline:  Goal status: INITIAL  6.  Patient reports return to community based exercise program and water  exercise program she has used pre surgery  Baseline:  Goal status: INITIAL   PLAN:  PT FREQUENCY: 2x/week  PT DURATION: 8 weeks  PLANNED INTERVENTIONS: 97164- PT Re-evaluation, 97110-Therapeutic exercises, 97530- Therapeutic activity, 97112- Neuromuscular re-education, 97535- Self Care, [id number]- Manual therapy, [id number]- Gait training, [phone]- Aquatic Therapy, Patient/Family education, Balance training, Stair training, Taping, Joint mobilization, and Scar mobilization  PLAN FOR NEXT SESSION: review and progress exercises; gait training; stairs; manual work and modalities as indicated   [employer], PT 02/06/2024, 11:18 AM

## 2024-02-07 ENCOUNTER — Encounter: Payer: Self-pay | Admitting: Bariatrics

## 2024-02-07 ENCOUNTER — Ambulatory Visit: Admitting: Bariatrics

## 2024-02-07 VITALS — BP 104/66 | HR 70 | Temp 97.9°F | Ht 66.0 in | Wt 249.0 lb

## 2024-02-07 DIAGNOSIS — R632 Polyphagia: Secondary | ICD-10-CM

## 2024-02-07 DIAGNOSIS — R7303 Prediabetes: Secondary | ICD-10-CM | POA: Diagnosis not present

## 2024-02-07 DIAGNOSIS — E66813 Obesity, class 3: Secondary | ICD-10-CM

## 2024-02-07 DIAGNOSIS — Z6841 Body Mass Index (BMI) 40.0 and over, adult: Secondary | ICD-10-CM

## 2024-02-07 MED ORDER — METFORMIN HCL 500 MG PO TABS: ORAL_TABLET | ORAL | 0 refills | Status: DC

## 2024-02-07 NOTE — Progress Notes (Signed)
 WEIGHT SUMMARY AND BIOMETRICS  Weight Lost Since Last Visit: 5lb  Weight Gained Since Last Visit: 0   Vitals Temp: 97.9 F (36.6 C) BP: 104/66 Pulse Rate: 70 SpO2: 97 %   Anthropometric Measurements Height: 5' 6 (1.676 m) Weight: 249 lb (112.9 kg) BMI (Calculated): 40.21 Weight at Last Visit: 252lb Weight Lost Since Last Visit: 5lb Weight Gained Since Last Visit: 0 Starting Weight: 275lb Total Weight Loss (lbs): 30 lb (13.6 kg)   Body Composition  Body Fat %: 51.1 % Fat Mass (lbs): 127.2 lbs Muscle Mass (lbs): 115.6 lbs Total Body Water  (lbs): 90.2 lbs Visceral Fat Rating : 17   Other Clinical Data Fasting: no Labs: no Today's Visit #: 20 Starting Date: 07/28/21    OBESITY Michelle Kennedy is here to discuss her progress with her obesity treatment plan along with follow-up of her obesity related diagnoses.    Nutrition Plan: the Category 4 plan - 80% adherence.  Current exercise: PT  Interim History:  She is down 5 lbs since her last visit. In the interium, she had surgery.  Not eating all of the food on the plan., Protein intake is as prescribed, Is not skipping meals, and Water  intake is adequate.   Pharmacotherapy: Michelle Kennedy is on metformin  500 mg 2 tablets in the a.m. and 2 tablets in the p.m. Adverse side effects: Nausea after going back on the medication but now has subsided Hunger is well controlled.  Cravings are moderately controlled.  Assessment/Plan:   Prediabetes Last A1c was 5.7  Medication(s): Metformin  500 mg (2 tablets) BID  Lab Results  Component Value Date   HGBA1C 5.7 (H) 12/27/2023   HGBA1C 5.6 06/19/2023   HGBA1C 5.9 02/21/2023   HGBA1C 5.7 (A) 10/03/2022   HGBA1C 5.7 (H) 07/06/2022   Lab Results  Component Value Date   INSULIN  15.5 12/27/2023   INSULIN  18.0 07/06/2022   INSULIN  25.6 (H) 07/28/2021   INSULIN  21.8  09/20/2016    Plan: Will minimize all refined carbohydrates both sweets and starches.  Will work on the plan and exercise.  Consider both aerobic and resistance training.  Will keep protein, water , and fiber intake high.  Increase Polyunsaturated and Monounsaturated fats to increase satiety and encourage weight loss.  Aim for 7 to 9 hours of sleep nightly.  Continue and refill Metformin  500 mg (2 tablets) BID  Polyphagia Michelle Kennedy endorses excessive hunger.  Medication(s): Metformin   Effects of medication:  moderately controlled. Cravings are moderately controlled.   Plan: Medication(s): Metformin  500  (2 tablets) BID Will increase water , protein and fiber to help assuage hunger.  Will minimize foods that have a high glucose index/load to minimize reactive hypoglycemia.   Labs discussed today from 12/27/2023 (CMP, Lipids, HgbA1c, insulin , vitamin D , and thyroid  panel).    Morbid Obesity: Current BMI BMI (Calculated): 40.21   Pharmacotherapy Plan Continue and refill metformin   500 mg 2 in the AM and 2 in the PM   Michelle Kennedy is currently in the action stage of change. As such, her goal is to continue with weight loss efforts.  She has agreed to the Category 4 plan.  Exercise goals: Older adults should follow the adult guidelines. When older adults cannot meet the adult guidelines, they should be as physically active as their abilities and conditions will allow.  She will continue her physical therapy and then resume her exercise at the gym when she is cleared from PT.  Behavioral modification strategies: increasing lean protein intake, no meal skipping, meal planning , increase water  intake, better snacking choices, planning for success, increasing vegetables, increasing fiber rich foods, decrease snacking , avoiding temptations, keep healthy foods in the home, increase frequency of journaling, weigh protein portions, and mindful eating.  Michelle Kennedy has agreed to follow-up with our clinic  in 6 weeks.    Objective:   VITALS: Per patient if applicable, see vitals. GENERAL: Alert and in no acute distress. CARDIOPULMONARY: No increased WOB. Speaking in clear sentences.  PSYCH: Pleasant and cooperative. Speech normal rate and rhythm. Affect is appropriate. Insight and judgement are appropriate. Attention is focused, linear, and appropriate.  NEURO: Oriented as arrived to appointment on time with no prompting.   Attestation Statements:   This was prepared with the assistance of Engineer, civil (consulting).  Occasional wrong-word or sound-a-like substitutions may have occurred due to the inherent limitations of voice recognition   Clayborne Daring, DO

## 2024-02-08 ENCOUNTER — Ambulatory Visit: Admitting: Rehabilitative and Restorative Service Providers"

## 2024-02-08 DIAGNOSIS — M25561 Pain in right knee: Secondary | ICD-10-CM

## 2024-02-08 DIAGNOSIS — M6281 Muscle weakness (generalized): Secondary | ICD-10-CM

## 2024-02-08 DIAGNOSIS — R2689 Other abnormalities of gait and mobility: Secondary | ICD-10-CM

## 2024-02-08 NOTE — Therapy (Signed)
 OUTPATIENT PHYSICAL THERAPY LOWER EXTREMITY EVALUATION   Patient Name: Michelle Kennedy MRN: [id number] DOB:Mar 24, 1956, 68 y.o., female Today's Date: 02/08/2024  END OF SESSION:  PT End of Session - 02/08/24 0852     Visit Number 2    Number of Visits 16    Date for Recertification  04/02/24    Authorization Type insurance not available    PT Start Time 0850    PT Stop Time 0930    PT Time Calculation (min) 40 min    Activity Tolerance Patient tolerated treatment well    Behavior During Therapy [facility] for tasks assessed/performed          Past Medical History:  Diagnosis Date   Abnormally small mouth    Achilles tendon disorder, right    Allergy    Anemia    Anxiety    Arthritis    hips and knees   Asthma    daily inhaler, prn inhaler and neb.   Basal cell carcinoma    nose, s/p mohs.   Breast cancer (HCC) 01/2014   left   Cataract    bilateral - surgery   CHF (congestive heart failure) (HCC)    COPD (chronic obstructive pulmonary disease) (HCC)    Dental bridge present    upper front and lower right   Dental crowns present    x 3   Depression    Esophageal spasm    reports since her chemo and breast surgery  she developed esophageal spasms and reports this is in the past has casue her 02 to desat in the  70s , denies sycnope in relation to this , does report hx of vertigo as well    Family history of anesthesia complication    twin brother aspirated and died on OR table, per pt.   Fibromyalgia    Gallbladder disease    Gallstones    GERD (gastroesophageal reflux disease)    Glaucoma    H/O blood clots    had blood to  PICC line    History of gastric ulcer    as a teenager   History of seizure age 46   as a reaction to Penicillin - no seizures since   History of thyroid  cancer    s/p thyroidectomy   HTN (hypertension)    Hyperlipidemia    Hypothyroidism    Joint pain    Liver disorder    seen when she had her hysterectomy , reports she was told it  was   small lesion ; but asymptomatic    Lymphedema    left arm   Migraines    Multiple food allergies    Obesity    OSA (obstructive sleep apnea) 02/16/2016   CPAP- uses nightly   Palpitations    reports no longer experiences   Personal history of chemotherapy 2015   Personal history of radiation therapy 2015   Left   Pneumonia    x 1 years ago   Pre-diabetes    Rheumatoid arthritis (HCC)    SOB (shortness of breath)    Swelling of both ankles    Tinnitus    UTI (lower urinary tract infection) 02/17/2014   Vertigo    Vitamin D  deficiency    Wears hearing aid in both ears    hearing loss   Past Surgical History:  Procedure Laterality Date   ABDOMINAL HYSTERECTOMY  2004   complete   ACHILLES TENDON REPAIR Right    APPENDECTOMY  2004  BACK SURGERY  02/09/2023   at the [facility] [place]   bilateral eye surgery Bilateral 2022   glaucoma   bladder stimulator  08/29/2022   Medtronic bladder stinulator - with remote   BREAST EXCISIONAL BIOPSY     BREAST LUMPECTOMY Left    2015   BREAST LUMPECTOMY WITH NEEDLE LOCALIZATION AND AXILLARY SENTINEL LYMPH NODE BX Left 02/23/2014   Procedure: BREAST LUMPECTOMY WITH NEEDLE LOCALIZATION AND AXILLARY SENTINEL LYMPH NODE BIOPSY;  Surgeon: Morene Olives, MD;  Location: [facility];  Service: General;  Laterality: Left;   BREAST SURGERY  2011   left breast biposy   CARPAL TUNNEL RELEASE Bilateral    CHOLECYSTECTOMY  1990   COLONOSCOPY W/ POLYPECTOMY  06/2009   DECOMPRESSIVE LUMBAR LAMINECTOMY LEVEL 1 N/A 06/22/2023   Procedure: LUMBAR FOUR/FIVE LAMINECTOMY WITH FACET CYST EXCISION;  Surgeon: Georgina Ozell LABOR, MD;  Location: [facility];  Service: Orthopedics;  Laterality: N/A;   EYE SURGERY Right 2013   exc. warts from underneath eyelid   INCONTINENCE SURGERY  2004   KNEE ARTHROSCOPY Bilateral    x 6 each knee   LIGAMENT REPAIR Right    thumb/wrist   ORIF TOE FRACTURE Right    great toe   PORTACATH  PLACEMENT N/A 03/24/2014   Procedure: INSERTION PORT-A-CATH;  Surgeon: Morene Olives, MD;  Location: [facility];  Service: General;  Laterality: N/A;; removed    THYROIDECTOMY  2000   TONSILLECTOMY AND ADENOIDECTOMY  2000   TOTAL KNEE ARTHROPLASTY Left 12/03/2017   Procedure: LEFT TOTAL KNEE ARTHROPLASTY;  Surgeon: Melodi Lerner, MD;  Location: [facility];  Service: Orthopedics;  Laterality: Left;   TOTAL KNEE ARTHROPLASTY Right 01/18/2024   Procedure: ARTHROPLASTY, KNEE, TOTAL;  Surgeon: Vernetta Lonni GRADE, MD;  Location: [facility];  Service: Orthopedics;  Laterality: Right;   TUBAL LIGATION  1981   TUMOR EXCISION     from thoracic spine   Patient Active Problem List   Diagnosis Date Noted   Status post total right knee replacement 01/18/2024   Asthma exacerbation 09/28/2023   Acute asthma exacerbation 09/27/2023   Chronic pain syndrome 09/27/2023   Hypothyroidism 09/27/2023   History of CVA (cerebrovascular accident) 2017 09/27/2023   History of DVT (deep vein thrombosis) of right upper extremity 2015 09/27/2023   Status post laminectomy 06/22/2023   Cyst of lumbar facet joint 06/22/2023   Pre-op evaluation 06/05/2023   Status post insertion of nerve stimulator 03/19/2023   History of thyroid  cancer    SOB (shortness of breath) on exertion 10/03/2022   Polyphagia 04/27/2022   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   Abdominal pannus 03/02/2022   Vertigo 02/07/2022   Peroneal tendinitis of left lower extremity 02/07/2022   Visceral obesity 02/01/2022   Sacral pain 09/20/2021   Pain in right knee 09/13/2021   Chronic left-sided low back pain with left-sided sciatica 04/27/2021   Nondisplaced fracture of middle phalanx of left lesser toe(s), initial encounter for closed fracture 09/17/2020   Chronic diastolic CHF (congestive heart failure) (HCC) 08/23/2019   Retinal artery occlusion 08/22/2019   History of total left knee replacement 12/07/2017   Unilateral primary  osteoarthritis, right knee 12/03/2017   Morbid obesity (HCC) 01/01/2017   Essential hypertension 10/18/2016   Constipation 10/04/2016   Vitamin D  deficiency 10/04/2016   Prediabetes 10/04/2016   BMI 39.0-39.9,adult 09/20/2016   OSA on CPAP 02/16/2016   Malignant neoplasm of thyroid  gland (HCC) 10/11/2015   De Quervain's tenosynovitis, bilateral 09/15/2015  Trigger finger, acquired 06/22/2015   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/10/2015   Postmenopausal osteoporosis 12/08/2014   Preventative health care 12/01/2014   DVT (deep venous thrombosis) (HCC) 08/28/2014   Lymphedema of arm 08/28/2014   Mucositis due to chemotherapy 06/02/2014   Menopausal syndrome (hot flashes) 12/31/2013   Breast cancer of upper-outer quadrant of left female breast (HCC) 12/26/2013   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   Gingivitis 12/05/2013   Seasonal allergies 09/24/2013   Hyperlipidemia 06/02/2011   Migraine 06/02/2011   Tinnitus of both ears 02/14/2011   Fibromyalgia 05/10/2010   Asthma 03/22/2010   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   GERD 12/20/2009   Depression 12/01/2009    PCP: Eleanor Ponto, NP REFERRING PROVIDER: Dr Lonni Poli REFERRING DIAG: R TKA  THERAPY DIAG:  Acute pain of right knee  Muscle weakness (generalized)  Other abnormalities of gait and mobility  Rationale for Evaluation and Treatment: Rehabilitation  ONSET DATE: 01/18/24  SUBJECTIVE:   SUBJECTIVE STATEMENT: The patient is 3 weeks today post surgery for R TKR. She notes continues stiffness in the R hip upon waking in the morning.  She is walking more and did 2100 steps yesterday.  EVAL: Patient reports that she has had pain in the R knee for years with bone spurs and bone on bone joint. She was treated for some time with injections but decided to undergo elective R TKA on 8.29.25. no post op complications.   PERTINENT HISTORY: L TKA ~ 10 yrs ago;  Malignant neoplasm L breast; achilles tendon disorder; anxiety; arthritis; asthma; cataract; CHF; COPD; fibromyapgia; gallbladder disease; history of blood clots; gastric ulcer; seizure; HTNhyperlipidemia; liver disorder; lymphedema L arm; migraines; obesity; OSA; prediabetic; swelling both ankles; tinnitus; vertigo PAIN:  Are you having pain? Yes: NPRS scale: 5-6/10  Pain location: R knee  Pain description: stiff; hurts Aggravating factors: standing > 10 min; walking > 1500 steps; late at night; awakens with pain at times Relieving factors: OTC meds; ice; heat on thigh   PRECAUTIONS: Other: hearing impaired reads lips; history of blood clots   WEIGHT BEARING RESTRICTIONS: No  FALLS:  Has patient fallen in last 6 months? 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  DIAGNOSTIC FINDINGS: xray R knee 01/18/24 IMPRESSION: Normal alignment of right total knee arthroplasty.  PATIENT SURVEYS:  LEFS  Extreme difficulty/unable (0), Quite a bit of difficulty (1), Moderate difficulty (2), Little difficulty (3), No difficulty (4) Survey date:    Any of your usual work, housework or school activities 0  2. Usual hobbies, recreational or sporting activities 0  3. Getting into/out of the bath 3  4. Walking between rooms 3  5. Putting on socks/shoes 3  6. Squatting  0  7. Lifting an object, like a bag of groceries from the floor 0  8.  Performing light activities around your home 1  9. Performing heavy activities around your home 0  10. Getting into/out of  a car 3  11. Walking 2 blocks 0  12. Walking 1 mile 0  13. Going up/down 10 stairs (1 flight) 0  14. Standing for 1 hour 0  15.  sitting for 1 hour 0  16. Running on even ground 0  17. Running on uneven ground 0  18. Making sharp turns while running fast 0  19. Hopping  0  20. Rolling over in bed   Score total:  15     COGNITION: Overall cognitive status: Within functional limits for tasks assessed     SENSATION: Numbness in surgical site and tingling R knee   EDEMA:  Moderate edema R knee   MUSCLE LENGTH: Hamstrings: Right 80 deg; Left 85 deg  POSTURE: rounded shoulders, forward head, flexed trunk , and weight shift left   LOWER EXTREMITY ROM: Active ROM Right eval Left eval Right 02/08/24  Hip flexion 110 110   Hip extension 0 0   Hip abduction     Hip adduction     Hip internal rotation     Hip external rotation     Knee flexion 81 115 82 sitting 84 supine  Knee extension -10 0 -8 supine  Ankle dorsiflexion     Ankle plantarflexion     Ankle inversion     Ankle eversion      (Blank rows = not tested)  LOWER EXTREMITY MMT: strength not tested resistively - moving R LE well against gravity   MMT Right eval Left eval  Hip flexion    Hip extension    Hip abduction    Hip adduction    Hip internal rotation    Hip external rotation    Knee flexion    Knee extension    Ankle dorsiflexion    Ankle plantarflexion    Ankle inversion    Ankle eversion     (Blank rows = not tested)  GAIT: Distance walked: 40 feet  Assistive device utilized: Single point cane Level of assistance: Complete Independence Comments: decreased wt bearing R LE in stance to toe off R LE    [facility] Adult PT Treatment:                                                DATE: 02/08/24 Therapeutic Exercise: Nu-step for knee warm up x 5 minutes level 3 Les  only Supine Bridging x 10 reps SLR x 10 reps Thomas test stretch with support of the knee for hip release (due to c/o pain and stiffness) Sitting Knee flexion/extension AAROM with towel Prone Knee flexion with support from towel to avoid full extension (too painful) Standing R heel cord stretch x 20 seconds L heel cord stretch x 20 seconds Manual Therapy: PROM hamstring stretch supine PROM knee flexion/extension supine STM hamstring musculature and gastroc in prone with towel proximal to knee to reduce pressure Gait: Gait with SPC x 100 ft, 75 feet encouraging R heel strike and knee flexion with gait After STM she notes improved gait with observable improvement with heel strike  PATIENT EDUCATION:  Education details: POC; HEP  Person educated: Patient Education method: Programmer, multimedia, Demonstration, Actor cues, Verbal cues, and Handouts Education comprehension: verbalized understanding, returned demonstration, verbal cues required, tactile cues required, and needs further education  HOME EXERCISE PROGRAM: Access Code: [email or web address] URL: [email or web address] Date: 02/08/2024 Prepared by: Tawni Ferrier  Exercises - Supine Quad Set  - 1-2 x daily - 7 x weekly - 1 sets - 10 reps - 3 sec  hold - Small Range Straight Leg Raise  - 1-2 x daily - 7 x weekly - 1 sets - 10 reps - 5 sec  hold - Straight Leg Raise with External Rotation  - 1-2 x daily - 7 x weekly - 1 sets - 10 reps - 3-5 sec  hold - Supine Knee Extension Strengthening  - 1-2 x daily - 7 x weekly - 1-2 sets - 10 reps - 5 sec  hold - Supine Hip Adduction Isometric with Ball  - 1-2 x daily - 7 x weekly - 1 sets - 10 reps - 10 sec  hold - Supine Heel Slide with Strap  - 1-2 x daily - 7 x weekly - 1 sets - 5-10 reps - 10 sec  hold - Sidelying Hip Abduction  - 1 x daily - 7 x weekly - 3 sets - 10  reps - 3-5 sec  hold - Seated Heel Slide  - 1-2 x daily - 7 x weekly - 1 sets - 10 reps - 3 sec  hold - Seated Passive Knee Extension with Weight  - 1 x daily - 7 x weekly - 1 sets - 1 reps - Gastroc Stretch on Wall  - 1 x daily - 7 x weekly - 1 sets - 2 reps - 20-30 seconds hold  ASSESSMENT:  CLINICAL IMPRESSION: Patient notes reduction in pain from 6/10 to 4/10 with improvement after prone knee stretching, and STM. Patient's gait pattern also changed throughout session gaining improved R heel strike, and R knee extension during mid stance after there ex. Patient has compressive ice machine at home and is able to do post therapy sessions. Plan to continue working on knee flexion ROM, knee extension ROM and gait mechanics.   EVAL: Patient is a 68 y.o. female who was seen today for physical therapy evaluation and treatment s/p R TKA 01/18/24 with no complications. She presents with abnormal gait pattern; decreased strength, mobility, ROM R LE; moderate edema R knee. Patient will benefit from PT to address problems identified and return to all normal functional and recreational activities.    OBJECTIVE IMPAIRMENTS: Abnormal gait, decreased activity tolerance, decreased balance, decreased ROM, decreased strength, increased edema, impaired flexibility, obesity, and pain.    GOALS: Goals reviewed with patient? Yes  SHORT TERM GOALS: Target date: 03/05/2024   Independent in initial HEP  Baseline: Goal status: INITIAL  2.  Increase R knee ROM to -5 deg extension to 95 deg flexion  Baseline:  Goal status: INITIAL  3.  Improved gait pattern with equal wt bearing LE's Baseline:  Goal status: INITIAL   LONG TERM GOALS: Target date: 04/02/2024   Increase strength R LE to 4+/5 to 5/5  Baseline: moves LE well against gravity at least 3/5 Goal status: INITIAL  2.  Increase R knee ROM to 0 deg extension and 100-105 deg flexion  Baseline:  Goal status: INITIAL  3.  Independent ambulation  without assistive device for household walking  Baseline:  Goal status: INITIAL  4.  Improve LEFS score  by 10 points  Baseline:  Goal status: INITIAL  5.  Independent in HEP  Baseline:  Goal status: INITIAL  6.  Patient reports return to community based exercise program and water  exercise program she has used pre surgery  Baseline:  Goal status: INITIAL   PLAN:  PT FREQUENCY: 2x/week  PT DURATION: 8 weeks  PLANNED INTERVENTIONS: [id number]- PT Re-evaluation, 97110-Therapeutic exercises, 97530- Therapeutic activity, 97112- Neuromuscular re-education, 97535- Self Care, [id number]- Manual therapy, [phone]- Gait training, [phone]- Aquatic Therapy, Patient/Family education, Balance training, Stair training, Taping, Joint mobilization, and Scar mobilization  PLAN FOR NEXT SESSION: review and progress exercises; gait training; stairs; manual work and modalities as indicated   Donshay Lupinski, PT 02/08/2024, 12:40 PM

## 2024-02-11 ENCOUNTER — Ambulatory Visit

## 2024-02-11 ENCOUNTER — Telehealth: Payer: Self-pay | Admitting: Radiology

## 2024-02-11 ENCOUNTER — Other Ambulatory Visit: Payer: Self-pay | Admitting: Orthopaedic Surgery

## 2024-02-11 DIAGNOSIS — M6281 Muscle weakness (generalized): Secondary | ICD-10-CM

## 2024-02-11 DIAGNOSIS — M25561 Pain in right knee: Secondary | ICD-10-CM

## 2024-02-11 DIAGNOSIS — R2689 Other abnormalities of gait and mobility: Secondary | ICD-10-CM

## 2024-02-11 MED ORDER — OXYCODONE HCL 5 MG PO TABS: 5.0000 mg | ORAL_TABLET | Freq: Four times a day (QID) | ORAL | 0 refills | Status: DC | PRN

## 2024-02-11 MED ORDER — TIZANIDINE HCL 2 MG PO TABS: 2.0000 mg | ORAL_TABLET | Freq: Four times a day (QID) | ORAL | 0 refills | Status: DC | PRN

## 2024-02-11 NOTE — Telephone Encounter (Signed)
 Patient called and requested pain medication and muscle relaxer refills.  Not sleeping thru night but days are ok.

## 2024-02-11 NOTE — Therapy (Signed)
 OUTPATIENT PHYSICAL THERAPY LOWER EXTREMITY TREATMENT   Patient Name: Michelle Kennedy MRN: [id number] DOB:14-May-1956, 68 y.o., female Today's Date: 02/11/2024  END OF SESSION:  PT End of Session - 02/11/24 1449     Visit Number 3    Number of Visits 16    Date for Recertification  04/02/24    Authorization Type Medicare & [employer] [place]    Progress Note Due on Visit 10    PT Start Time 1448    PT Stop Time 1530    PT Time Calculation (min) 42 min    Activity Tolerance Patient tolerated treatment well    Behavior During Therapy WFL for tasks assessed/performed          Past Medical History:  Diagnosis Date   Abnormally small mouth    Achilles tendon disorder, right    Allergy    Anemia    Anxiety    Arthritis    hips and knees   Asthma    daily inhaler, prn inhaler and neb.   Basal cell carcinoma    nose, s/p mohs.   Breast cancer (HCC) 01/2014   left   Cataract    bilateral - surgery   CHF (congestive heart failure) (HCC)    COPD (chronic obstructive pulmonary disease) (HCC)    Dental bridge present    upper front and lower right   Dental crowns present    x 3   Depression    Esophageal spasm    reports since her chemo and breast surgery  she developed esophageal spasms and reports this is in the past has casue her 02 to desat in the  70s , denies sycnope in relation to this , does report hx of vertigo as well    Family history of anesthesia complication    twin brother aspirated and died on OR table, per pt.   Fibromyalgia    Gallbladder disease    Gallstones    GERD (gastroesophageal reflux disease)    Glaucoma    H/O blood clots    had blood to  PICC line    History of gastric ulcer    as a teenager   History of seizure age 9   as a reaction to Penicillin - no seizures since   History of thyroid  cancer    s/p thyroidectomy   HTN (hypertension)    Hyperlipidemia    Hypothyroidism    Joint pain    Liver disorder    seen when she had her  hysterectomy , reports she was told it was   small lesion ; but asymptomatic    Lymphedema    left arm   Migraines    Multiple food allergies    Obesity    OSA (obstructive sleep apnea) 02/16/2016   CPAP- uses nightly   Palpitations    reports no longer experiences   Personal history of chemotherapy 2015   Personal history of radiation therapy 2015   Left   Pneumonia    x 1 years ago   Pre-diabetes    Rheumatoid arthritis (HCC)    SOB (shortness of breath)    Swelling of both ankles    Tinnitus    UTI (lower urinary tract infection) 02/17/2014   Vertigo    Vitamin D  deficiency    Wears hearing aid in both ears    hearing loss   Past Surgical History:  Procedure Laterality Date   ABDOMINAL HYSTERECTOMY  2004   complete  ACHILLES TENDON REPAIR Right    APPENDECTOMY  2004   BACK SURGERY  02/09/2023   at [facility]   bilateral eye surgery Bilateral 2022   glaucoma   bladder stimulator  08/29/2022   Medtronic bladder stinulator - with remote   BREAST EXCISIONAL BIOPSY     BREAST LUMPECTOMY Left    2015   BREAST LUMPECTOMY WITH NEEDLE LOCALIZATION AND AXILLARY SENTINEL LYMPH NODE BX Left 02/23/2014   Procedure: BREAST LUMPECTOMY WITH NEEDLE LOCALIZATION AND AXILLARY SENTINEL LYMPH NODE BIOPSY;  Surgeon: Morene Olives, MD;  Location: [facility];  Service: General;  Laterality: Left;   BREAST SURGERY  2011   left breast biposy   CARPAL TUNNEL RELEASE Bilateral    CHOLECYSTECTOMY  1990   COLONOSCOPY W/ POLYPECTOMY  06/2009   DECOMPRESSIVE LUMBAR LAMINECTOMY LEVEL 1 N/A 06/22/2023   Procedure: LUMBAR FOUR/FIVE LAMINECTOMY WITH FACET CYST EXCISION;  Surgeon: Georgina Ozell LABOR, MD;  Location: [facility];  Service: Orthopedics;  Laterality: N/A;   EYE SURGERY Right 2013   exc. warts from underneath eyelid   INCONTINENCE SURGERY  2004   KNEE ARTHROSCOPY Bilateral    x 6 each knee   LIGAMENT REPAIR Right    thumb/wrist   ORIF TOE FRACTURE  Right    great toe   PORTACATH PLACEMENT N/A 03/24/2014   Procedure: INSERTION PORT-A-CATH;  Surgeon: Morene Olives, MD;  Location: [facility];  Service: General;  Laterality: N/A;; removed    THYROIDECTOMY  2000   TONSILLECTOMY AND ADENOIDECTOMY  2000   TOTAL KNEE ARTHROPLASTY Left 12/03/2017   Procedure: LEFT TOTAL KNEE ARTHROPLASTY;  Surgeon: Melodi Lerner, MD;  Location: [facility];  Service: Orthopedics;  Laterality: Left;   TOTAL KNEE ARTHROPLASTY Right 01/18/2024   Procedure: ARTHROPLASTY, KNEE, TOTAL;  Surgeon: Vernetta Lonni GRADE, MD;  Location: [facility];  Service: Orthopedics;  Laterality: Right;   TUBAL LIGATION  1981   TUMOR EXCISION     from thoracic spine   Patient Active Problem List   Diagnosis Date Noted   Status post total right knee replacement 01/18/2024   Asthma exacerbation 09/28/2023   Acute asthma exacerbation 09/27/2023   Chronic pain syndrome 09/27/2023   Hypothyroidism 09/27/2023   History of CVA (cerebrovascular accident) 2017 09/27/2023   History of DVT (deep vein thrombosis) of right upper extremity 2015 09/27/2023   Status post laminectomy 06/22/2023   Cyst of lumbar facet joint 06/22/2023   Pre-op evaluation 06/05/2023   Status post insertion of nerve stimulator 03/19/2023   History of thyroid  cancer    SOB (shortness of breath) on exertion 10/03/2022   Polyphagia 04/27/2022   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   Abdominal pannus 03/02/2022   Vertigo 02/07/2022   Peroneal tendinitis of left lower extremity 02/07/2022   Visceral obesity 02/01/2022   Sacral pain 09/20/2021   Pain in right knee 09/13/2021   Chronic left-sided low back pain with left-sided sciatica 04/27/2021   Nondisplaced fracture of middle phalanx of left lesser toe(s), initial encounter for closed fracture 09/17/2020   Chronic diastolic CHF (congestive heart failure) (HCC) 08/23/2019   Retinal artery occlusion 08/22/2019   History of total left knee replacement  12/07/2017   Unilateral primary osteoarthritis, right knee 12/03/2017   Morbid obesity (HCC) 01/01/2017   Essential hypertension 10/18/2016   Constipation 10/04/2016   Vitamin D  deficiency 10/04/2016   Prediabetes 10/04/2016   BMI 39.0-39.9,adult 09/20/2016   OSA on CPAP 02/16/2016   Malignant neoplasm of  thyroid  gland (HCC) 10/11/2015   De Quervain's tenosynovitis, bilateral 09/15/2015   Trigger finger, acquired 06/22/2015   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/10/2015   Postmenopausal osteoporosis 12/08/2014   Preventative health care 12/01/2014   DVT (deep venous thrombosis) (HCC) 08/28/2014   Lymphedema of arm 08/28/2014   Mucositis due to chemotherapy 06/02/2014   Menopausal syndrome (hot flashes) 12/31/2013   Breast cancer of upper-outer quadrant of left female breast (HCC) 12/26/2013   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   Gingivitis 12/05/2013   Seasonal allergies 09/24/2013   Hyperlipidemia 06/02/2011   Migraine 06/02/2011   Tinnitus of both ears 02/14/2011   Fibromyalgia 05/10/2010   Asthma 03/22/2010   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   GERD 12/20/2009   Depression 12/01/2009    PCP: Eleanor Ponto, NP REFERRING PROVIDER: Dr Lonni Poli REFERRING DIAG: R TKA  THERAPY DIAG:  Acute pain of right knee  Muscle weakness (generalized)  Other abnormalities of gait and mobility  Rationale for Evaluation and Treatment: Rehabilitation  ONSET DATE: 01/18/24  SUBJECTIVE:   SUBJECTIVE STATEMENT: Patient reports she has been having cramp/spasms at front of thigh and tightness/tenderness behind knee. Patient states 4/10 pain in knee today; states she was 7/10 pain earlier and took pain medicine after lunch.   EVAL: Patient reports that she has had pain in the R knee for years with bone spurs and bone on bone joint. She was treated for some time with injections but decided to undergo elective R TKA on 8.29.25. no  post op complications.   PERTINENT HISTORY: L TKA ~ 10 yrs ago; Malignant neoplasm L breast; achilles tendon disorder; anxiety; arthritis; asthma; cataract; CHF; COPD; fibromyapgia; gallbladder disease; history of blood clots; gastric ulcer; seizure; HTNhyperlipidemia; liver disorder; lymphedema L arm; migraines; obesity; OSA; prediabetic; swelling both ankles; tinnitus; vertigo PAIN:  Are you having pain? Yes: NPRS scale: 5-6/10  Pain location: R knee  Pain description: stiff; hurts Aggravating factors: standing > 10 min; walking > 1500 steps; late at night; awakens with pain at times Relieving factors: OTC meds; ice; heat on thigh   PRECAUTIONS: Other: hearing impaired reads lips; history of blood clots   WEIGHT BEARING RESTRICTIONS: No  FALLS:  Has patient fallen in last 6 months? 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  DIAGNOSTIC FINDINGS: xray R knee 01/18/24 IMPRESSION: Normal alignment of right total knee arthroplasty.  PATIENT SURVEYS:  LEFS  Extreme difficulty/unable (0), Quite a bit of difficulty (1), Moderate difficulty (2), Little difficulty (3), No difficulty (4) Survey date:    Any of your usual work, housework or school activities 0  2. Usual hobbies, recreational or sporting activities 0  3. Getting into/out of the bath 3  4. Walking between rooms 3  5. Putting on socks/shoes 3  6. Squatting  0  7.  Lifting an object, like a bag of groceries from the floor 0  8. Performing light activities around your home 1  9. Performing heavy activities around your home 0  10. Getting into/out of a car 3  11. Walking 2 blocks 0  12. Walking 1 mile 0  13. Going up/down 10 stairs (1 flight) 0  14. Standing for 1 hour 0  15.  sitting for 1 hour 0  16. Running on even ground 0  17. Running on uneven ground 0  18. Making sharp turns while running fast 0  19. Hopping  0  20. Rolling over in bed   Score total:  15     COGNITION: Overall cognitive status: Within functional limits for tasks assessed     SENSATION: Numbness in surgical site and tingling R knee   EDEMA:  Moderate edema R knee   MUSCLE LENGTH: Hamstrings: Right 80 deg; Left 85 deg  POSTURE: rounded shoulders, forward head, flexed trunk , and weight shift left   LOWER EXTREMITY ROM: Active ROM Right eval Left eval Right 02/08/24  Hip flexion 110 110   Hip extension 0 0   Hip abduction     Hip adduction     Hip internal rotation     Hip external rotation     Knee flexion 81 115 82 sitting 84 supine  Knee extension -10 0 -8 supine  Ankle dorsiflexion     Ankle plantarflexion     Ankle inversion     Ankle eversion                                                                       (Blank rows = not tested)  LOWER EXTREMITY MMT: strength not tested resistively - moving R LE well against gravity   MMT Right eval Left eval  Hip flexion    Hip extension    Hip abduction    Hip adduction    Hip internal rotation    Hip external rotation    Knee flexion    Knee extension    Ankle dorsiflexion    Ankle plantarflexion    Ankle inversion    Ankle eversion     (Blank rows = not tested)  GAIT: Distance walked: 40 feet  Assistive device utilized: Single point cane Level of assistance: Complete Independence Comments: decreased wt bearing R LE in stance to toe off R LE    [facility] Adult PT Treatment:                                                 DATE: 02/11/2024 Therapeutic Exercise: NuStep L3 x 5 min + subjective intake --> focus on progressing knee mobility Self-massage along HS with orange spiky ball Supine knee flexion slides with foot on orange PB Seated knee flexion AROM with foot on towel Manual Therapy: IASTM posterior knee complex --> towel above knee for pressure relief Neuromuscular re-ed: Quad set with towel behind knee 10x5 Small range SLR 10x5 Sit to stand form elevated surface --> increasing weight bearing on Rt LE as tolerated TKE + green TB x10 Rt step over low block with weight shifting onto leg --> fwd/bkwd Walking with focus on knee flexion on swing phase  [facility] Adult PT Treatment:                                                DATE: 02/08/24 Therapeutic Exercise: Nu-step for knee warm up x 5 minutes level 3 Les only Supine Bridging x 10 reps SLR x 10 reps Thomas test stretch with support of the knee for hip release (due to c/o pain and stiffness) Sitting Knee flexion/extension AAROM with towel Prone Knee flexion with support from towel to avoid full extension (too painful) Standing R heel cord stretch x 20 seconds L heel cord stretch x 20 seconds Manual Therapy: PROM hamstring stretch supine PROM knee flexion/extension supine STM hamstring musculature and gastroc in prone with towel proximal to knee to reduce pressure Gait: Gait with SPC x 100 ft, 75 feet encouraging R heel strike and knee flexion with gait After STM she notes improved gait with observable improvement with heel strike                                                                                                                        PATIENT EDUCATION:  Education details: POC; HEP  Person educated: Patient Education method: Programmer, multimedia, Demonstration, Tactile cues, Verbal cues, and Handouts Education comprehension: verbalized understanding, returned demonstration, verbal cues  required, tactile cues required, and needs further education  HOME EXERCISE PROGRAM: Access Code: [email or web address] URL: [email or web address] Date: 02/08/2024 Prepared by: Tawni Ferrier  Exercises - Supine Quad Set  - 1-2 x daily - 7 x weekly - 1 sets - 10 reps - 3 sec  hold - Small Range Straight Leg Raise  - 1-2 x daily - 7 x weekly - 1 sets - 10 reps - 5 sec  hold - Straight Leg Raise with External Rotation  - 1-2 x daily - 7 x weekly - 1 sets - 10 reps - 3-5 sec  hold - Supine Knee Extension Strengthening  - 1-2 x daily - 7 x weekly - 1-2 sets - 10 reps - 5 sec  hold - Supine Hip Adduction Isometric with Ball  - 1-2 x daily - 7 x weekly - 1 sets - 10 reps - 10 sec  hold - Supine Heel Slide with Strap  - 1-2 x daily - 7 x weekly - 1 sets - 5-10 reps - 10 sec  hold - Sidelying Hip Abduction  - 1 x daily - 7 x weekly - 3 sets - 10 reps - 3-5 sec  hold - Seated Heel Slide  - 1-2 x daily - 7 x weekly - 1 sets - 10 reps - 3 sec  hold - Seated Passive Knee Extension with Weight  - 1 x daily - 7 x weekly - 1 sets - 1 reps - Gastroc Stretch on Wall  - 1 x daily - 7 x weekly - 1 sets - 2  reps - 20-30 seconds hold  ASSESSMENT:  CLINICAL IMPRESSION: Continued progressing knee mobility to tolerance; cueing needed to slow pace and encourage patient to hold stretch at end range. Swing phase with knee flexion improved during walking and decreased low back discomfort and hip hike compensation. Manual continued to address myofasical tightness/tension at posterior knee complex.   EVAL: Patient is a 68 y.o. female who was seen today for physical therapy evaluation and treatment s/p R TKA 01/18/24 with no complications. She presents with abnormal gait pattern; decreased strength, mobility, ROM R LE; moderate edema R knee. Patient will benefit from PT to address problems identified and return to all normal functional and recreational activities.    OBJECTIVE IMPAIRMENTS: Abnormal gait, decreased  activity tolerance, decreased balance, decreased ROM, decreased strength, increased edema, impaired flexibility, obesity, and pain.    GOALS: Goals reviewed with patient? Yes  SHORT TERM GOALS: Target date: 03/05/2024  Independent in initial HEP  Baseline: Goal status: INITIAL  2.  Increase R knee ROM to -5 deg extension to 95 deg flexion  Baseline:  Goal status: INITIAL  3.  Improved gait pattern with equal wt bearing LE's Baseline:  Goal status: INITIAL   LONG TERM GOALS: Target date: 04/02/2024  Increase strength R LE to 4+/5 to 5/5  Baseline: moves LE well against gravity at least 3/5 Goal status: INITIAL  2.  Increase R knee ROM to 0 deg extension and 100-105 deg flexion  Baseline:  Goal status: INITIAL  3.  Independent ambulation without assistive device for household walking  Baseline:  Goal status: INITIAL  4.  Improve LEFS score by 10 points  Baseline:  Goal status: INITIAL  5.  Independent in HEP  Baseline:  Goal status: INITIAL  6.  Patient reports return to community based exercise program and water  exercise program she has used pre surgery  Baseline:  Goal status: INITIAL   PLAN:  PT FREQUENCY: 2x/week  PT DURATION: 8 weeks  PLANNED INTERVENTIONS: [id number]- PT Re-evaluation, 97110-Therapeutic exercises, 97530- Therapeutic activity, 97112- Neuromuscular re-education, 97535- Self Care, [id number]- Manual therapy, [phone]- Gait training, [phone]- Aquatic Therapy, Patient/Family education, Balance training, Stair training, Taping, Joint mobilization, and Scar mobilization  PLAN FOR NEXT SESSION: knee flexion/extension; gait training; stairs; manual work and modalities as indicated   Lamarr GORMAN Price, PTA 02/11/2024, 3:37 PM

## 2024-02-13 ENCOUNTER — Ambulatory Visit

## 2024-02-13 DIAGNOSIS — M25561 Pain in right knee: Secondary | ICD-10-CM

## 2024-02-13 DIAGNOSIS — R2689 Other abnormalities of gait and mobility: Secondary | ICD-10-CM

## 2024-02-13 DIAGNOSIS — M6281 Muscle weakness (generalized): Secondary | ICD-10-CM

## 2024-02-13 NOTE — Therapy (Signed)
 OUTPATIENT PHYSICAL THERAPY LOWER EXTREMITY TREATMENT   Patient Name: Michelle Kennedy MRN: [id number] DOB:1956-01-28, 68 y.o., female Today's Date: 02/13/2024  END OF SESSION:  PT End of Session - 02/13/24 1314     Visit Number 4    Number of Visits 16    Date for Recertification  04/02/24    Authorization Type Medicare & [employer] [place]    Progress Note Due on Visit 10    PT Start Time 1315    PT Stop Time 1355    PT Time Calculation (min) 40 min    Activity Tolerance Patient tolerated treatment well    Behavior During Therapy WFL for tasks assessed/performed         Past Medical History:  Diagnosis Date   Abnormally small mouth    Achilles tendon disorder, right    Allergy    Anemia    Anxiety    Arthritis    hips and knees   Asthma    daily inhaler, prn inhaler and neb.   Basal cell carcinoma    nose, s/p mohs.   Breast cancer (HCC) 01/2014   left   Cataract    bilateral - surgery   CHF (congestive heart failure) (HCC)    COPD (chronic obstructive pulmonary disease) (HCC)    Dental bridge present    upper front and lower right   Dental crowns present    x 3   Depression    Esophageal spasm    reports since her chemo and breast surgery  she developed esophageal spasms and reports this is in the past has casue her 02 to desat in the  70s , denies sycnope in relation to this , does report hx of vertigo as well    Family history of anesthesia complication    twin brother aspirated and died on OR table, per pt.   Fibromyalgia    Gallbladder disease    Gallstones    GERD (gastroesophageal reflux disease)    Glaucoma    H/O blood clots    had blood to  PICC line    History of gastric ulcer    as a teenager   History of seizure age 5   as a reaction to Penicillin - no seizures since   History of thyroid  cancer    s/p thyroidectomy   HTN (hypertension)    Hyperlipidemia    Hypothyroidism    Joint pain    Liver disorder    seen when she had her  hysterectomy , reports she was told it was   small lesion ; but asymptomatic    Lymphedema    left arm   Migraines    Multiple food allergies    Obesity    OSA (obstructive sleep apnea) 02/16/2016   CPAP- uses nightly   Palpitations    reports no longer experiences   Personal history of chemotherapy 2015   Personal history of radiation therapy 2015   Left   Pneumonia    x 1 years ago   Pre-diabetes    Rheumatoid arthritis (HCC)    SOB (shortness of breath)    Swelling of both ankles    Tinnitus    UTI (lower urinary tract infection) 02/17/2014   Vertigo    Vitamin D  deficiency    Wears hearing aid in both ears    hearing loss   Past Surgical History:  Procedure Laterality Date   ABDOMINAL HYSTERECTOMY  2004   complete  ACHILLES TENDON REPAIR Right    APPENDECTOMY  2004   BACK SURGERY  02/09/2023   at [facility]   bilateral eye surgery Bilateral 2022   glaucoma   bladder stimulator  08/29/2022   Medtronic bladder stinulator - with remote   BREAST EXCISIONAL BIOPSY     BREAST LUMPECTOMY Left    2015   BREAST LUMPECTOMY WITH NEEDLE LOCALIZATION AND AXILLARY SENTINEL LYMPH NODE BX Left 02/23/2014   Procedure: BREAST LUMPECTOMY WITH NEEDLE LOCALIZATION AND AXILLARY SENTINEL LYMPH NODE BIOPSY;  Surgeon: Morene Olives, MD;  Location: [facility];  Service: General;  Laterality: Left;   BREAST SURGERY  2011   left breast biposy   CARPAL TUNNEL RELEASE Bilateral    CHOLECYSTECTOMY  1990   COLONOSCOPY W/ POLYPECTOMY  06/2009   DECOMPRESSIVE LUMBAR LAMINECTOMY LEVEL 1 N/A 06/22/2023   Procedure: LUMBAR FOUR/FIVE LAMINECTOMY WITH FACET CYST EXCISION;  Surgeon: Georgina Ozell LABOR, MD;  Location: [facility];  Service: Orthopedics;  Laterality: N/A;   EYE SURGERY Right 2013   exc. warts from underneath eyelid   INCONTINENCE SURGERY  2004   KNEE ARTHROSCOPY Bilateral    x 6 each knee   LIGAMENT REPAIR Right    thumb/wrist   ORIF TOE FRACTURE  Right    great toe   PORTACATH PLACEMENT N/A 03/24/2014   Procedure: INSERTION PORT-A-CATH;  Surgeon: Morene Olives, MD;  Location: [facility];  Service: General;  Laterality: N/A;; removed    THYROIDECTOMY  2000   TONSILLECTOMY AND ADENOIDECTOMY  2000   TOTAL KNEE ARTHROPLASTY Left 12/03/2017   Procedure: LEFT TOTAL KNEE ARTHROPLASTY;  Surgeon: Melodi Lerner, MD;  Location: [facility];  Service: Orthopedics;  Laterality: Left;   TOTAL KNEE ARTHROPLASTY Right 01/18/2024   Procedure: ARTHROPLASTY, KNEE, TOTAL;  Surgeon: Vernetta Lonni GRADE, MD;  Location: [facility];  Service: Orthopedics;  Laterality: Right;   TUBAL LIGATION  1981   TUMOR EXCISION     from thoracic spine   Patient Active Problem List   Diagnosis Date Noted   Status post total right knee replacement 01/18/2024   Asthma exacerbation 09/28/2023   Acute asthma exacerbation 09/27/2023   Chronic pain syndrome 09/27/2023   Hypothyroidism 09/27/2023   History of CVA (cerebrovascular accident) 2017 09/27/2023   History of DVT (deep vein thrombosis) of right upper extremity 2015 09/27/2023   Status post laminectomy 06/22/2023   Cyst of lumbar facet joint 06/22/2023   Pre-op evaluation 06/05/2023   Status post insertion of nerve stimulator 03/19/2023   History of thyroid  cancer    SOB (shortness of breath) on exertion 10/03/2022   Polyphagia 04/27/2022   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   Abdominal pannus 03/02/2022   Vertigo 02/07/2022   Peroneal tendinitis of left lower extremity 02/07/2022   Visceral obesity 02/01/2022   Sacral pain 09/20/2021   Pain in right knee 09/13/2021   Chronic left-sided low back pain with left-sided sciatica 04/27/2021   Nondisplaced fracture of middle phalanx of left lesser toe(s), initial encounter for closed fracture 09/17/2020   Chronic diastolic CHF (congestive heart failure) (HCC) 08/23/2019   Retinal artery occlusion 08/22/2019   History of total left knee replacement  12/07/2017   Unilateral primary osteoarthritis, right knee 12/03/2017   Morbid obesity (HCC) 01/01/2017   Essential hypertension 10/18/2016   Constipation 10/04/2016   Vitamin D  deficiency 10/04/2016   Prediabetes 10/04/2016   BMI 39.0-39.9,adult 09/20/2016   OSA on CPAP 02/16/2016   Malignant neoplasm of  thyroid  gland (HCC) 10/11/2015   De Quervain's tenosynovitis, bilateral 09/15/2015   Trigger finger, acquired 06/22/2015   Chemotherapy-induced peripheral neuropathy 05/10/2015   Postmenopausal osteoporosis 12/08/2014   Preventative health care 12/01/2014   DVT (deep venous thrombosis) (HCC) 08/28/2014   Lymphedema of arm 08/28/2014   Mucositis due to chemotherapy 06/02/2014   Menopausal syndrome (hot flashes) 12/31/2013   Breast cancer of upper-outer quadrant of left female breast (HCC) 12/26/2013   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   Gingivitis 12/05/2013   Seasonal allergies 09/24/2013   Hyperlipidemia 06/02/2011   Migraine 06/02/2011   Tinnitus of both ears 02/14/2011   Fibromyalgia 05/10/2010   Asthma 03/22/2010   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   GERD 12/20/2009   Depression 12/01/2009    PCP: Eleanor Ponto, NP REFERRING PROVIDER: Dr Lonni Poli REFERRING DIAG: R TKA  THERAPY DIAG:  Acute pain of right knee  Muscle weakness (generalized)  Other abnormalities of gait and mobility  Rationale for Evaluation and Treatment: Rehabilitation  ONSET DATE: 01/18/24  SUBJECTIVE:   SUBJECTIVE STATEMENT: Patient reports 5/10 knee pain today, states she was sore behind knees after last visit. Patient states she is averaging 2300 steps a day.   EVAL: Patient reports that she has had pain in the R knee for years with bone spurs and bone on bone joint. She was treated for some time with injections but decided to undergo elective R TKA on 8.29.25. no post op complications.   PERTINENT HISTORY: L TKA ~ 10 yrs ago; Malignant  neoplasm L breast; achilles tendon disorder; anxiety; arthritis; asthma; cataract; CHF; COPD; fibromyapgia; gallbladder disease; history of blood clots; gastric ulcer; seizure; HTNhyperlipidemia; liver disorder; lymphedema L arm; migraines; obesity; OSA; prediabetic; swelling both ankles; tinnitus; vertigo PAIN:  Are you having pain? Yes: NPRS scale: 5/10  Pain location: R knee  Pain description: stiff; hurts Aggravating factors: standing > 10 min; walking > 1500 steps; late at night; awakens with pain at times Relieving factors: OTC meds; ice; heat on thigh   PRECAUTIONS: Other: hearing impaired reads lips; history of blood clots   WEIGHT BEARING RESTRICTIONS: No  FALLS:  Has patient fallen in last 6 months? 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  DIAGNOSTIC FINDINGS: xray R knee 01/18/24 IMPRESSION: Normal alignment of right total knee arthroplasty.  PATIENT SURVEYS:  LEFS  Extreme difficulty/unable (0), Quite a bit of difficulty (1), Moderate difficulty (2), Little difficulty (3), No difficulty (4) Survey date:    Any of your usual work, housework or school activities 0  2. Usual hobbies, recreational or sporting activities 0  3. Getting into/out of the bath 3  4. Walking between rooms 3  5. Putting on socks/shoes 3  6. Squatting  0  7. Lifting an object, like a bag of groceries from the floor 0  8. Performing light  activities around your home 1  9. Performing heavy activities around your home 0  10.  Getting into/out of a car 3  11. Walking 2 blocks 0  12. Walking 1 mile 0  13. Going up/down 10 stairs (1 flight) 0  14. Standing for 1 hour 0  15.  sitting for 1 hour 0  16. Running on even ground 0  17. Running on uneven ground 0  18. Making sharp turns while running fast 0  19. Hopping  0  20. Rolling over in bed   Score total:  15     COGNITION: Overall cognitive status: Within functional limits for tasks assessed     SENSATION: Numbness in surgical site and tingling R knee   EDEMA:  Moderate edema R knee   MUSCLE LENGTH: Hamstrings: Right 80 deg; Left 85 deg  POSTURE: rounded shoulders, forward head, flexed trunk , and weight shift left   LOWER EXTREMITY ROM: Active ROM Right eval Left eval Right 02/08/24  Hip flexion 110 110   Hip extension 0 0   Hip abduction     Hip adduction     Hip internal rotation     Hip external rotation     Knee flexion 81 115 82 sitting 84 supine  Knee extension -10 0 -8 supine  Ankle dorsiflexion     Ankle plantarflexion     Ankle inversion     Ankle eversion                                                                       (Blank rows = not tested)  LOWER EXTREMITY MMT: strength not tested resistively - moving R LE well against gravity   MMT Right eval Left eval  Hip flexion    Hip extension    Hip abduction    Hip adduction    Hip internal rotation    Hip external rotation    Knee flexion    Knee extension    Ankle dorsiflexion    Ankle plantarflexion    Ankle inversion    Ankle eversion     (Blank rows = not tested)  GAIT: Distance walked: 40 feet  Assistive device utilized: Single point cane Level of assistance: Complete Independence Comments: decreased wt bearing R LE in stance to toe off R LE    [facility] Adult PT Treatment:                                                DATE: 02/13/2024 Therapeutic  Exercise:  Neuromuscular re-ed: Quad set --> foot on towel roll 10x5  SAQ with green bolster 10x5 Small range SLR 10x5 Squats --> mirror for postural awareness Therapeutic Activity: NuStep L4 x 5 min --> progressing knee mobility + subjective intake Seated knee flexion slides --> gradually lowering table Standing weight shifting: side, fwd, bkwd  Standing heel/toe raises Rt step over low block with weight shifting onto leg --> fwd/bkwd    [facility] Adult PT Treatment:  DATE: 02/11/2024 Therapeutic Exercise: NuStep L3 x 5 min + subjective intake --> focus on progressing knee mobility Self-massage along HS with orange spiky ball Supine knee flexion slides with foot on orange PB Seated knee flexion AROM with foot on towel Manual Therapy: IASTM posterior knee complex --> towel above knee for pressure relief Neuromuscular re-ed: Quad set with towel behind knee 10x5 Small range SLR 10x5 Sit to stand form elevated surface --> increasing weight bearing on Rt LE as tolerated TKE + green TB x10 Rt step over low block with weight shifting onto leg --> fwd/bkwd Walking with focus on knee flexion on swing phase    [facility] Adult PT Treatment:                                                DATE: 02/08/24 Therapeutic Exercise: Nu-step for knee warm up x 5 minutes level 3 Les only Supine Bridging x 10 reps SLR x 10 reps Thomas test stretch with support of the knee for hip release (due to c/o pain and stiffness) Sitting Knee flexion/extension AAROM with towel Prone Knee flexion with support from towel to avoid full extension (too painful) Standing R heel cord stretch x 20 seconds L heel cord stretch x 20 seconds Manual Therapy: PROM hamstring stretch supine PROM knee flexion/extension supine STM hamstring musculature and gastroc in prone with towel proximal to knee to reduce pressure Gait: Gait with SPC x 100 ft, 75 feet encouraging R heel  strike and knee flexion with gait After STM she notes improved gait with observable improvement with heel strike                                                                                                                        PATIENT EDUCATION:  Education details: POC; HEP  Person educated: Patient Education method: Programmer, multimedia, Demonstration, Tactile cues, Verbal cues, and Handouts Education comprehension: verbalized understanding, returned demonstration, verbal cues required, tactile cues required, and needs further education  HOME EXERCISE PROGRAM: Access Code: [email or web address] URL: [email or web address] Date: 02/08/2024 Prepared by: Tawni Ferrier  Exercises - Supine Quad Set  - 1-2 x daily - 7 x weekly - 1 sets - 10 reps - 3 sec  hold - Small Range Straight Leg Raise  - 1-2 x daily - 7 x weekly - 1 sets - 10 reps - 5 sec  hold - Straight Leg Raise with External Rotation  - 1-2 x daily - 7 x weekly - 1 sets - 10 reps - 3-5 sec  hold - Supine Knee Extension Strengthening  - 1-2 x daily - 7 x weekly - 1-2 sets - 10 reps - 5 sec  hold - Supine Hip Adduction Isometric with Ball  - 1-2 x daily - 7 x weekly - 1 sets - 10 reps - 10 sec  hold - Supine Heel  Slide with Strap  - 1-2 x daily - 7 x weekly - 1 sets - 5-10 reps - 10 sec  hold - Sidelying Hip Abduction  - 1 x daily - 7 x weekly - 3 sets - 10 reps - 3-5 sec  hold - Seated Heel Slide  - 1-2 x daily - 7 x weekly - 1 sets - 10 reps - 3 sec  hold - Seated Passive Knee Extension with Weight  - 1 x daily - 7 x weekly - 1 sets - 1 reps - Gastroc Stretch on Wall  - 1 x daily - 7 x weekly - 1 sets - 2 reps - 20-30 seconds hold  ASSESSMENT:  CLINICAL IMPRESSION: Quad activation progressed with seated and standing exercises; noted improvement in knee extension ROM as compared to previous session. Mirror incorporated to increase patient's postural awareness and increase weight bearing on Rt LE during squats. Recommended place  hands at front of hips when walking for hip flexion cue to decrease lumbar discomfort and hip hike compensation.   EVAL: Patient is a 68 y.o. female who was seen today for physical therapy evaluation and treatment s/p R TKA 01/18/24 with no complications. She presents with abnormal gait pattern; decreased strength, mobility, ROM R LE; moderate edema R knee. Patient will benefit from PT to address problems identified and return to all normal functional and recreational activities.    OBJECTIVE IMPAIRMENTS: Abnormal gait, decreased activity tolerance, decreased balance, decreased ROM, decreased strength, increased edema, impaired flexibility, obesity, and pain.    GOALS: Goals reviewed with patient? Yes  SHORT TERM GOALS: Target date: 03/05/2024  Independent in initial HEP  Baseline: Goal status: INITIAL  2.  Increase R knee ROM to -5 deg extension to 95 deg flexion  Baseline:  Goal status: INITIAL  3.  Improved gait pattern with equal wt bearing LE's Baseline:  Goal status: INITIAL   LONG TERM GOALS: Target date: 04/02/2024  Increase strength R LE to 4+/5 to 5/5  Baseline: moves LE well against gravity at least 3/5 Goal status: INITIAL  2.  Increase R knee ROM to 0 deg extension and 100-105 deg flexion  Baseline:  Goal status: INITIAL  3.  Independent ambulation without assistive device for household walking  Baseline:  Goal status: INITIAL  4.  Improve LEFS score by 10 points  Baseline:  Goal status: INITIAL  5.  Independent in HEP  Baseline:  Goal status: INITIAL  6.  Patient reports return to community based exercise program and water  exercise program she has used pre surgery  Baseline:  Goal status: INITIAL   PLAN:  PT FREQUENCY: 2x/week  PT DURATION: 8 weeks  PLANNED INTERVENTIONS: 97164- PT Re-evaluation, 97110-Therapeutic exercises, 97530- Therapeutic activity, 97112- Neuromuscular re-education, 97535- Self Care, [id number]- Manual therapy, [phone]- Gait  training, [phone]- Aquatic Therapy, Patient/Family education, Balance training, Stair training, Taping, Joint mobilization, and Scar mobilization  PLAN FOR NEXT SESSION: knee flexion/extension; gait training; stairs; manual work and modalities as indicated   Lamarr GORMAN Price, PTA 02/13/2024, 1:57 PM

## 2024-02-18 ENCOUNTER — Ambulatory Visit

## 2024-02-18 DIAGNOSIS — R2689 Other abnormalities of gait and mobility: Secondary | ICD-10-CM

## 2024-02-18 DIAGNOSIS — M6281 Muscle weakness (generalized): Secondary | ICD-10-CM

## 2024-02-18 DIAGNOSIS — M25561 Pain in right knee: Secondary | ICD-10-CM | POA: Diagnosis not present

## 2024-02-18 DIAGNOSIS — R252 Cramp and spasm: Secondary | ICD-10-CM

## 2024-02-18 NOTE — Therapy (Signed)
 OUTPATIENT PHYSICAL THERAPY LOWER EXTREMITY TREATMENT   Patient Name: Michelle Kennedy MRN: [id number] DOB:1956-03-04, 68 y.o., female Today's Date: 02/18/2024  END OF SESSION:  PT End of Session - 02/18/24 1402     Visit Number 5    Number of Visits 16    Date for Recertification  04/02/24    Authorization Type Medicare & [employer] [place]    Progress Note Due on Visit 10    PT Start Time 1403    PT Stop Time 1500    PT Time Calculation (min) 57 min    Activity Tolerance Patient tolerated treatment well    Behavior During Therapy WFL for tasks assessed/performed         Past Medical History:  Diagnosis Date   Abnormally small mouth    Achilles tendon disorder, right    Allergy    Anemia    Anxiety    Arthritis    hips and knees   Asthma    daily inhaler, prn inhaler and neb.   Basal cell carcinoma    nose, s/p mohs.   Breast cancer (HCC) 01/2014   left   Cataract    bilateral - surgery   CHF (congestive heart failure) (HCC)    COPD (chronic obstructive pulmonary disease) (HCC)    Dental bridge present    upper front and lower right   Dental crowns present    x 3   Depression    Esophageal spasm    reports since her chemo and breast surgery  she developed esophageal spasms and reports this is in the past has casue her 02 to desat in the  70s , denies sycnope in relation to this , does report hx of vertigo as well    Family history of anesthesia complication    twin brother aspirated and died on OR table, per pt.   Fibromyalgia    Gallbladder disease    Gallstones    GERD (gastroesophageal reflux disease)    Glaucoma    H/O blood clots    had blood to  PICC line    History of gastric ulcer    as a teenager   History of seizure age 38   as a reaction to Penicillin - no seizures since   History of thyroid  cancer    s/p thyroidectomy   HTN (hypertension)    Hyperlipidemia    Hypothyroidism    Joint pain    Liver disorder    seen when she had her  hysterectomy , reports she was told it was   small lesion ; but asymptomatic    Lymphedema    left arm   Migraines    Multiple food allergies    Obesity    OSA (obstructive sleep apnea) 02/16/2016   CPAP- uses nightly   Palpitations    reports no longer experiences   Personal history of chemotherapy 2015   Personal history of radiation therapy 2015   Left   Pneumonia    x 1 years ago   Pre-diabetes    Rheumatoid arthritis (HCC)    SOB (shortness of breath)    Swelling of both ankles    Tinnitus    UTI (lower urinary tract infection) 02/17/2014   Vertigo    Vitamin D  deficiency    Wears hearing aid in both ears    hearing loss   Past Surgical History:  Procedure Laterality Date   ABDOMINAL HYSTERECTOMY  2004   complete  ACHILLES TENDON REPAIR Right    APPENDECTOMY  2004   BACK SURGERY  02/09/2023   at [facility]   bilateral eye surgery Bilateral 2022   glaucoma   bladder stimulator  08/29/2022   Medtronic bladder stinulator - with remote   BREAST EXCISIONAL BIOPSY     BREAST LUMPECTOMY Left    2015   BREAST LUMPECTOMY WITH NEEDLE LOCALIZATION AND AXILLARY SENTINEL LYMPH NODE BX Left 02/23/2014   Procedure: BREAST LUMPECTOMY WITH NEEDLE LOCALIZATION AND AXILLARY SENTINEL LYMPH NODE BIOPSY;  Surgeon: Morene Olives, MD;  Location: [facility];  Service: General;  Laterality: Left;   BREAST SURGERY  2011   left breast biposy   CARPAL TUNNEL RELEASE Bilateral    CHOLECYSTECTOMY  1990   COLONOSCOPY W/ POLYPECTOMY  06/2009   DECOMPRESSIVE LUMBAR LAMINECTOMY LEVEL 1 N/A 06/22/2023   Procedure: LUMBAR FOUR/FIVE LAMINECTOMY WITH FACET CYST EXCISION;  Surgeon: Georgina Ozell LABOR, MD;  Location: [facility];  Service: Orthopedics;  Laterality: N/A;   EYE SURGERY Right 2013   exc. warts from underneath eyelid   INCONTINENCE SURGERY  2004   KNEE ARTHROSCOPY Bilateral    x 6 each knee   LIGAMENT REPAIR Right    thumb/wrist   ORIF TOE FRACTURE  Right    great toe   PORTACATH PLACEMENT N/A 03/24/2014   Procedure: INSERTION PORT-A-CATH;  Surgeon: Morene Olives, MD;  Location: [facility];  Service: General;  Laterality: N/A;; removed    THYROIDECTOMY  2000   TONSILLECTOMY AND ADENOIDECTOMY  2000   TOTAL KNEE ARTHROPLASTY Left 12/03/2017   Procedure: LEFT TOTAL KNEE ARTHROPLASTY;  Surgeon: Melodi Lerner, MD;  Location: [facility];  Service: Orthopedics;  Laterality: Left;   TOTAL KNEE ARTHROPLASTY Right 01/18/2024   Procedure: ARTHROPLASTY, KNEE, TOTAL;  Surgeon: Vernetta Lonni GRADE, MD;  Location: [facility];  Service: Orthopedics;  Laterality: Right;   TUBAL LIGATION  1981   TUMOR EXCISION     from thoracic spine   Patient Active Problem List   Diagnosis Date Noted   Status post total right knee replacement 01/18/2024   Asthma exacerbation 09/28/2023   Acute asthma exacerbation 09/27/2023   Chronic pain syndrome 09/27/2023   Hypothyroidism 09/27/2023   History of CVA (cerebrovascular accident) 2017 09/27/2023   History of DVT (deep vein thrombosis) of right upper extremity 2015 09/27/2023   Status post laminectomy 06/22/2023   Cyst of lumbar facet joint 06/22/2023   Pre-op evaluation 06/05/2023   Status post insertion of nerve stimulator 03/19/2023   History of thyroid  cancer    SOB (shortness of breath) on exertion 10/03/2022   Polyphagia 04/27/2022   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   Abdominal pannus 03/02/2022   Vertigo 02/07/2022   Peroneal tendinitis of left lower extremity 02/07/2022   Visceral obesity 02/01/2022   Sacral pain 09/20/2021   Pain in right knee 09/13/2021   Chronic left-sided low back pain with left-sided sciatica 04/27/2021   Nondisplaced fracture of middle phalanx of left lesser toe(s), initial encounter for closed fracture 09/17/2020   Chronic diastolic CHF (congestive heart failure) (HCC) 08/23/2019   Retinal artery occlusion 08/22/2019   History of total left knee replacement  12/07/2017   Unilateral primary osteoarthritis, right knee 12/03/2017   Morbid obesity (HCC) 01/01/2017   Essential hypertension 10/18/2016   Constipation 10/04/2016   Vitamin D  deficiency 10/04/2016   Prediabetes 10/04/2016   BMI 39.0-39.9,adult 09/20/2016   OSA on CPAP 02/16/2016   Malignant neoplasm of  thyroid  gland (HCC) 10/11/2015   De Quervain's tenosynovitis, bilateral 09/15/2015   Trigger finger, acquired 06/22/2015   Chemotherapy-induced peripheral neuropathy 05/10/2015   Postmenopausal osteoporosis 12/08/2014   Preventative health care 12/01/2014   DVT (deep venous thrombosis) (HCC) 08/28/2014   Lymphedema of arm 08/28/2014   Mucositis due to chemotherapy 06/02/2014   Menopausal syndrome (hot flashes) 12/31/2013   Breast cancer of upper-outer quadrant of left female breast (HCC) 12/26/2013   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   Gingivitis 12/05/2013   Seasonal allergies 09/24/2013   Hyperlipidemia 06/02/2011   Migraine 06/02/2011   Tinnitus of both ears 02/14/2011   Fibromyalgia 05/10/2010   Asthma 03/22/2010   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   GERD 12/20/2009   Depression 12/01/2009    PCP: Eleanor Ponto, NP REFERRING PROVIDER: Dr Lonni Poli REFERRING DIAG: R TKA  THERAPY DIAG:  Acute pain of right knee  Muscle weakness (generalized)  Other abnormalities of gait and mobility  Cramp and spasm  Rationale for Evaluation and Treatment: Rehabilitation  ONSET DATE: 01/18/24  SUBJECTIVE:   SUBJECTIVE STATEMENT: Patient reports her fibromyalgia is flared up today due to the weather; states her knee pain is 4/10 and has not had any pain medication since this morning.    EVAL: Patient reports that she has had pain in the R knee for years with bone spurs and bone on bone joint. She was treated for some time with injections but decided to undergo elective R TKA on 8.29.25. no post op complications.   PERTINENT  HISTORY: L TKA ~ 10 yrs ago; Malignant neoplasm L breast; achilles tendon disorder; anxiety; arthritis; asthma; cataract; CHF; COPD; fibromyapgia; gallbladder disease; history of blood clots; gastric ulcer; seizure; HTNhyperlipidemia; liver disorder; lymphedema L arm; migraines; obesity; OSA; prediabetic; swelling both ankles; tinnitus; vertigo PAIN:  Are you having pain? Yes: NPRS scale: 4/10  Pain location: R knee  Pain description: stiff; hurts Aggravating factors: standing > 10 min; walking > 1500 steps; late at night; awakens with pain at times Relieving factors: OTC meds; ice; heat on thigh   PRECAUTIONS: Other: hearing impaired reads lips; history of blood clots   WEIGHT BEARING RESTRICTIONS: No  FALLS:  Has patient fallen in last 6 months? 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  DIAGNOSTIC FINDINGS: xray R knee 01/18/24 IMPRESSION: Normal alignment of right total knee arthroplasty.  PATIENT SURVEYS:  LEFS  Extreme difficulty/unable (0), Quite a bit of difficulty (1), Moderate difficulty (2), Little difficulty (3), No difficulty (4) Survey date:    Any of your usual work, housework or school activities 0  2. Usual hobbies, recreational or sporting activities 0  3. Getting into/out of the bath 3  4. Walking between rooms 3  5. Putting on socks/shoes 3  6. Squatting  0  7. Lifting an object, like a bag of groceries  from the floor 0  8. Performing light activities around your home 1  9.  Performing heavy activities around your home 0  10. Getting into/out of a car 3  11. Walking 2 blocks 0  12. Walking 1 mile 0  13. Going up/down 10 stairs (1 flight) 0  14. Standing for 1 hour 0  15.  sitting for 1 hour 0  16. Running on even ground 0  17. Running on uneven ground 0  18. Making sharp turns while running fast 0  19. Hopping  0  20. Rolling over in bed   Score total:  15     COGNITION: Overall cognitive status: Within functional limits for tasks assessed     SENSATION: Numbness in surgical site and tingling R knee   EDEMA:  Moderate edema R knee   MUSCLE LENGTH: Hamstrings: Right 80 deg; Left 85 deg  POSTURE: rounded shoulders, forward head, flexed trunk , and weight shift left   LOWER EXTREMITY ROM: Active ROM Right eval Left eval Right 02/08/24  Hip flexion 110 110   Hip extension 0 0   Hip abduction     Hip adduction     Hip internal rotation     Hip external rotation     Knee flexion 81 115 82 sitting 84 supine  Knee extension -10 0 -8 supine  Ankle dorsiflexion     Ankle plantarflexion     Ankle inversion     Ankle eversion                                                                       (Blank rows = not tested)  LOWER EXTREMITY MMT: strength not tested resistively - moving R LE well against gravity   MMT Right eval Left eval  Hip flexion    Hip extension    Hip abduction    Hip adduction    Hip internal rotation    Hip external rotation    Knee flexion    Knee extension    Ankle dorsiflexion    Ankle plantarflexion    Ankle inversion    Ankle eversion     (Blank rows = not tested)  GAIT: Distance walked: 40 feet  Assistive device utilized: Single point cane Level of assistance: Complete Independence Comments: decreased wt bearing R LE in stance to toe off R LE    [facility] Adult PT Treatment:                                                 DATE: 02/18/2024 Manual Therapy: IASTM (Rt) hamstrings, proximal gastroc Neuromuscular re-ed: Supine quad set with foot propped on towel roll Seated knee extension with quad isometric kick into orange PB Weight shifting onto Rt LE Therapeutic Activity: NuStep L5 x 5 min for knee mobility + subjective intake Supine AAROM heel slides with strap Supine ITB stretch with strap + cues for postural awareness Knee flexion PROM to tolerable end range Step over low block fwd/bkwd  Gait: Gait training with SPC -> focus on hip/knee flexion on swing phase & heel strike    [facility] Adult PT Treatment:  DATE: 02/13/2024 Neuromuscular re-ed: Quad set --> foot on towel roll 10x5  SAQ with green bolster 10x5 Small range SLR 10x5 Squats --> mirror for postural awareness Therapeutic Activity: NuStep L4 x 5 min --> progressing knee mobility + subjective intake Seated knee flexion slides --> gradually lowering table Standing weight shifting: side, fwd, bkwd  Standing heel/toe raises Rt step over low block with weight shifting onto leg --> fwd/bkwd    [facility] Adult PT Treatment:                                                DATE: 02/11/2024 Therapeutic Exercise: NuStep L3 x 5 min + subjective intake --> focus on progressing knee mobility Self-massage along HS with orange spiky ball Supine knee flexion slides with foot on orange PB Seated knee flexion AROM with foot on towel Manual Therapy: IASTM posterior knee complex --> towel above knee for pressure relief Neuromuscular re-ed: Quad set with towel behind knee 10x5 Small range SLR 10x5 Sit to stand form elevated surface --> increasing weight bearing on Rt LE as tolerated TKE + green TB x10 Rt step over low block with weight shifting onto leg --> fwd/bkwd Walking with focus on knee flexion on swing phase    [facility] Adult PT Treatment:                                                DATE:  02/08/24 Therapeutic Exercise: Nu-step for knee warm up x 5 minutes level 3 Les only Supine Bridging x 10 reps SLR x 10 reps Thomas test stretch with support of the knee for hip release (due to c/o pain and stiffness) Sitting Knee flexion/extension AAROM with towel Prone Knee flexion with support from towel to avoid full extension (too painful) Standing R heel cord stretch x 20 seconds L heel cord stretch x 20 seconds Manual Therapy: PROM hamstring stretch supine PROM knee flexion/extension supine STM hamstring musculature and gastroc in prone with towel proximal to knee to reduce pressure Gait: Gait with SPC x 100 ft, 75 feet encouraging R heel strike and knee flexion with gait After STM she notes improved gait with observable improvement with heel strike                                                                                                                        PATIENT EDUCATION:  Education details: POC; HEP  Person educated: Patient Education method: Programmer, multimedia, Demonstration, Tactile cues, Verbal cues, and Handouts Education comprehension: verbalized understanding, returned demonstration, verbal cues required, tactile cues required, and needs further education  HOME EXERCISE PROGRAM: Access Code: [email or web address] URL: [email or web address] Date: 02/08/2024 Prepared by: Tawni Ferrier  Exercises - Supine Quad Set  -  1-2 x daily - 7 x weekly - 1 sets - 10 reps - 3 sec  hold - Small Range Straight Leg Raise  - 1-2 x daily - 7 x weekly - 1 sets - 10 reps - 5 sec  hold - Straight Leg Raise with External Rotation  - 1-2 x daily - 7 x weekly - 1 sets - 10 reps - 3-5 sec  hold - Supine Knee Extension Strengthening  - 1-2 x daily - 7 x weekly - 1-2 sets - 10 reps - 5 sec  hold - Supine Hip Adduction Isometric with Ball  - 1-2 x daily - 7 x weekly - 1 sets - 10 reps - 10 sec  hold - Supine Heel Slide with Strap  - 1-2 x daily - 7 x weekly - 1 sets - 5-10 reps - 10  sec  hold - Sidelying Hip Abduction  - 1 x daily - 7 x weekly - 3 sets - 10 reps - 3-5 sec  hold - Seated Heel Slide  - 1-2 x daily - 7 x weekly - 1 sets - 10 reps - 3 sec  hold - Seated Passive Knee Extension with Weight  - 1 x daily - 7 x weekly - 1 sets - 1 reps - Gastroc Stretch on Wall  - 1 x daily - 7 x weekly - 1 sets - 2 reps - 20-30 seconds hold  ASSESSMENT:  CLINICAL IMPRESSION: Improved gait mechanics and overall ease of knee mobility demonstrated after manual intervention. Noted myofascial tension in Rt hamstring complex; patient responded well to gentle self-massage with soft 4 ball and recommended patient incorporate activity with HEP. Moderate quad activation demonstrated with quad set variations; patient will continue to benefit from skilled therapy to address pain and mobility deficits.  EVAL: Patient is a 68 y.o. female who was seen today for physical therapy evaluation and treatment s/p R TKA 01/18/24 with no complications. She presents with abnormal gait pattern; decreased strength, mobility, ROM R LE; moderate edema R knee. Patient will benefit from PT to address problems identified and return to all normal functional and recreational activities.    OBJECTIVE IMPAIRMENTS: Abnormal gait, decreased activity tolerance, decreased balance, decreased ROM, decreased strength, increased edema, impaired flexibility, obesity, and pain.    GOALS: Goals reviewed with patient? Yes  SHORT TERM GOALS: Target date: 03/05/2024  Independent in initial HEP  Baseline: Goal status: INITIAL  2.  Increase R knee ROM to -5 deg extension to 95 deg flexion  Baseline:  Goal status: INITIAL  3.  Improved gait pattern with equal wt bearing LE's Baseline:  Goal status: INITIAL   LONG TERM GOALS: Target date: 04/02/2024  Increase strength R LE to 4+/5 to 5/5  Baseline: moves LE well against gravity at least 3/5 Goal status: INITIAL  2.  Increase R knee ROM to 0 deg extension and 100-105  deg flexion  Baseline:  Goal status: INITIAL  3.  Independent ambulation without assistive device for household walking  Baseline:  Goal status: INITIAL  4.  Improve LEFS score by 10 points  Baseline:  Goal status: INITIAL  5.  Independent in HEP  Baseline:  Goal status: INITIAL  6.  Patient reports return to community based exercise program and water  exercise program she has used pre surgery  Baseline:  Goal status: INITIAL   PLAN:  PT FREQUENCY: 2x/week  PT DURATION: 8 weeks  PLANNED INTERVENTIONS: [id number]- PT Re-evaluation, 97110-Therapeutic exercises, 97530- Therapeutic activity, [id number]- Neuromuscular  re-education, [phone]- Self Care, [id number]- Manual therapy, [phone]- Gait training, [phone]- Aquatic Therapy, Patient/Family education, Balance training, Stair training, Taping, Joint mobilization, and Scar mobilization  PLAN FOR NEXT SESSION: knee flexion/extension; gait training; stairs; manual work and modalities as indicated   Lamarr GORMAN Price, PTA 02/18/2024, 3:01 PM

## 2024-02-20 ENCOUNTER — Ambulatory Visit: Attending: Orthopaedic Surgery

## 2024-02-20 DIAGNOSIS — M6281 Muscle weakness (generalized): Secondary | ICD-10-CM | POA: Diagnosis present

## 2024-02-20 DIAGNOSIS — R29898 Other symptoms and signs involving the musculoskeletal system: Secondary | ICD-10-CM | POA: Insufficient documentation

## 2024-02-20 DIAGNOSIS — R2689 Other abnormalities of gait and mobility: Secondary | ICD-10-CM | POA: Diagnosis present

## 2024-02-20 DIAGNOSIS — M25561 Pain in right knee: Secondary | ICD-10-CM | POA: Diagnosis present

## 2024-02-20 DIAGNOSIS — R252 Cramp and spasm: Secondary | ICD-10-CM | POA: Diagnosis present

## 2024-02-20 NOTE — Therapy (Signed)
 OUTPATIENT PHYSICAL THERAPY LOWER EXTREMITY TREATMENT   Patient Name: Michelle Kennedy MRN: [id number] DOB:[birth year], 68 y.o., female Today's Date: 02/20/2024  END OF SESSION:  PT End of Session - 02/20/24 1406     Visit Number 6    Number of Visits 16    Date for Recertification  04/02/24    Authorization Type Medicare & [employer] [place]    Progress Note Due on Visit 10    PT Start Time 1405    PT Stop Time 1448    PT Time Calculation (min) 43 min    Activity Tolerance Patient tolerated treatment well    Behavior During Therapy WFL for tasks assessed/performed         Past Medical History:  Diagnosis Date   Abnormally small mouth    Achilles tendon disorder, right    Allergy    Anemia    Anxiety    Arthritis    hips and knees   Asthma    daily inhaler, prn inhaler and neb.   Basal cell carcinoma    nose, s/p mohs.   Breast cancer (HCC) 01/2014   left   Cataract    bilateral - surgery   CHF (congestive heart failure) (HCC)    COPD (chronic obstructive pulmonary disease) (HCC)    Dental bridge present    upper front and lower right   Dental crowns present    x 3   Depression    Esophageal spasm    reports since her chemo and breast surgery  she developed esophageal spasms and reports this is in the past has casue her 02 to desat in the  70s , denies sycnope in relation to this , does report hx of vertigo as well    Family history of anesthesia complication    twin brother aspirated and died on OR table, per pt.   Fibromyalgia    Gallbladder disease    Gallstones    GERD (gastroesophageal reflux disease)    Glaucoma    H/O blood clots    had blood to  PICC line    History of gastric ulcer    as a teenager   History of seizure age 25   as a reaction to Penicillin - no seizures since   History of thyroid  cancer    s/p thyroidectomy   HTN (hypertension)    Hyperlipidemia    Hypothyroidism    Joint pain    Liver disorder    seen when she had her  hysterectomy , reports she was told it was   small lesion ; but asymptomatic    Lymphedema    left arm   Migraines    Multiple food allergies    Obesity    OSA (obstructive sleep apnea) 02/16/2016   CPAP- uses nightly   Palpitations    reports no longer experiences   Personal history of chemotherapy 2015   Personal history of radiation therapy 2015   Left   Pneumonia    x 1 years ago   Pre-diabetes    Rheumatoid arthritis (HCC)    SOB (shortness of breath)    Swelling of both ankles    Tinnitus    UTI (lower urinary tract infection) 02/17/2014   Vertigo    Vitamin D  deficiency    Wears hearing aid in both ears    hearing loss   Past Surgical History:  Procedure Laterality Date   ABDOMINAL HYSTERECTOMY  2004   complete  ACHILLES TENDON REPAIR Right    APPENDECTOMY  2004   BACK SURGERY  02/09/2023   at [facility]   bilateral eye surgery Bilateral 2022   glaucoma   bladder stimulator  08/29/2022   Medtronic bladder stinulator - with remote   BREAST EXCISIONAL BIOPSY     BREAST LUMPECTOMY Left    2015   BREAST LUMPECTOMY WITH NEEDLE LOCALIZATION AND AXILLARY SENTINEL LYMPH NODE BX Left 02/23/2014   Procedure: BREAST LUMPECTOMY WITH NEEDLE LOCALIZATION AND AXILLARY SENTINEL LYMPH NODE BIOPSY;  Surgeon: Morene Olives, MD;  Location: [facility];  Service: General;  Laterality: Left;   BREAST SURGERY  2011   left breast biposy   CARPAL TUNNEL RELEASE Bilateral    CHOLECYSTECTOMY  1990   COLONOSCOPY W/ POLYPECTOMY  06/2009   DECOMPRESSIVE LUMBAR LAMINECTOMY LEVEL 1 N/A 06/22/2023   Procedure: LUMBAR FOUR/FIVE LAMINECTOMY WITH FACET CYST EXCISION;  Surgeon: Georgina Ozell LABOR, MD;  Location: [facility];  Service: Orthopedics;  Laterality: N/A;   EYE SURGERY Right 2013   exc. warts from underneath eyelid   INCONTINENCE SURGERY  2004   KNEE ARTHROSCOPY Bilateral    x 6 each knee   LIGAMENT REPAIR Right    thumb/wrist   ORIF TOE FRACTURE  Right    great toe   PORTACATH PLACEMENT N/A 03/24/2014   Procedure: INSERTION PORT-A-CATH;  Surgeon: Morene Olives, MD;  Location:  SURGERY CENTER;  Service: General;  Laterality: N/A;; removed    THYROIDECTOMY  2000   TONSILLECTOMY AND ADENOIDECTOMY  2000   TOTAL KNEE ARTHROPLASTY Left 12/03/2017   Procedure: LEFT TOTAL KNEE ARTHROPLASTY;  Surgeon: Melodi Lerner, MD;  Location: [facility];  Service: Orthopedics;  Laterality: Left;   TOTAL KNEE ARTHROPLASTY Right 01/18/2024   Procedure: ARTHROPLASTY, KNEE, TOTAL;  Surgeon: Vernetta Lonni GRADE, MD;  Location: [facility];  Service: Orthopedics;  Laterality: Right;   TUBAL LIGATION  1981   TUMOR EXCISION     from thoracic spine   Patient Active Problem List   Diagnosis Date Noted   Status post total right knee replacement 01/18/2024   Asthma exacerbation 09/28/2023   Acute asthma exacerbation 09/27/2023   Chronic pain syndrome 09/27/2023   Hypothyroidism 09/27/2023   History of CVA (cerebrovascular accident) 2017 09/27/2023   History of DVT (deep vein thrombosis) of right upper extremity 2015 09/27/2023   Status post laminectomy 06/22/2023   Cyst of lumbar facet joint 06/22/2023   Pre-op evaluation 06/05/2023   Status post insertion of nerve stimulator 03/19/2023   History of thyroid  cancer    SOB (shortness of breath) on exertion 10/03/2022   Polyphagia 04/27/2022   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   Abdominal pannus 03/02/2022   Vertigo 02/07/2022   Peroneal tendinitis of left lower extremity 02/07/2022   Visceral obesity 02/01/2022   Sacral pain 09/20/2021   Pain in right knee 09/13/2021   Chronic left-sided low back pain with left-sided sciatica 04/27/2021   Nondisplaced fracture of middle phalanx of left lesser toe(s), initial encounter for closed fracture 09/17/2020   Chronic diastolic CHF (congestive heart failure) (HCC) 08/23/2019   Retinal artery occlusion 08/22/2019   History of total left knee replacement  12/07/2017   Unilateral primary osteoarthritis, right knee 12/03/2017   Morbid obesity (HCC) 01/01/2017   Essential hypertension 10/18/2016   Constipation 10/04/2016   Vitamin D  deficiency 10/04/2016   Prediabetes 10/04/2016   BMI 39.0-39.9,adult 09/20/2016   OSA on CPAP 02/16/2016   Malignant neoplasm of  thyroid  gland (HCC) 10/11/2015   De Quervain's tenosynovitis, bilateral 09/15/2015   Trigger finger, acquired 06/22/2015   Chemotherapy-induced peripheral neuropathy 05/10/2015   Postmenopausal osteoporosis 12/08/2014   Preventative health care 12/01/2014   DVT (deep venous thrombosis) (HCC) 08/28/2014   Lymphedema of arm 08/28/2014   Mucositis due to chemotherapy 06/02/2014   Menopausal syndrome (hot flashes) 12/31/2013   Breast cancer of upper-outer quadrant of left female breast (HCC) 12/26/2013   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   Gingivitis 12/05/2013   Seasonal allergies 09/24/2013   Hyperlipidemia 06/02/2011   Migraine 06/02/2011   Tinnitus of both ears 02/14/2011   Fibromyalgia 05/10/2010   Asthma 03/22/2010   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   GERD 12/20/2009   Depression 12/01/2009    PCP: Eleanor Ponto, NP REFERRING PROVIDER: Dr Lonni Poli REFERRING DIAG: R TKA  THERAPY DIAG:  Acute pain of right knee  Muscle weakness (generalized)  Other abnormalities of gait and mobility  Rationale for Evaluation and Treatment: Rehabilitation  ONSET DATE: 01/18/24  SUBJECTIVE:   SUBJECTIVE STATEMENT: Patient reports 3/10 pain in knee today; states she sat down in toilet without realizing elevated seat wasn't there and went straight down on knee.   EVAL: Patient reports that she has had pain in the R knee for years with bone spurs and bone on bone joint. She was treated for some time with injections but decided to undergo elective R TKA on 8.29.25. no post op complications.   PERTINENT HISTORY: L TKA ~ 10 yrs ago;  Malignant neoplasm L breast; achilles tendon disorder; anxiety; arthritis; asthma; cataract; CHF; COPD; fibromyapgia; gallbladder disease; history of blood clots; gastric ulcer; seizure; HTNhyperlipidemia; liver disorder; lymphedema L arm; migraines; obesity; OSA; prediabetic; swelling both ankles; tinnitus; vertigo PAIN:  Are you having pain? Yes: NPRS scale: 3/10  Pain location: R knee  Pain description: stiff; hurts Aggravating factors: standing > 10 min; walking > 1500 steps; late at night; awakens with pain at times Relieving factors: OTC meds; ice; heat on thigh   PRECAUTIONS: Other: hearing impaired reads lips; history of blood clots   WEIGHT BEARING RESTRICTIONS: No  FALLS:  Has patient fallen in last 6 months? 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  DIAGNOSTIC FINDINGS: xray R knee 01/18/24 IMPRESSION: Normal alignment of right total knee arthroplasty.  PATIENT SURVEYS:  LEFS  Extreme difficulty/unable (0), Quite a bit of difficulty (1), Moderate difficulty (2), Little difficulty (3), No difficulty (4) Survey date:    Any of your usual work, housework or school activities 0  2. Usual hobbies, recreational or sporting activities 0  3. Getting into/out of the bath 3  4. Walking between rooms 3  5. Putting on socks/shoes 3  6. Squatting  0  7. Lifting an object, like a bag of groceries from the floor 0  8.  Performing light activities around your home 1  9. Performing heavy activities around your home 0  10. Getting into/out of a car 3  11. Walking 2 blocks 0  12. Walking 1 mile 0  13. Going up/down 10 stairs (1 flight) 0  14. Standing for 1 hour 0  15.  sitting for 1 hour 0  16. Running on even ground 0  17. Running on uneven ground 0  18. Making sharp turns while running fast 0  19. Hopping  0  20. Rolling over in bed   Score total:  15     COGNITION: Overall cognitive status: Within functional limits for tasks assessed     SENSATION: Numbness in surgical site and tingling R knee   EDEMA:  Moderate edema R knee   MUSCLE LENGTH: Hamstrings: Right 80 deg; Left 85 deg  POSTURE: rounded shoulders, forward head, flexed trunk , and weight shift left   LOWER EXTREMITY ROM: Active ROM Right eval Left eval Right 02/08/24 Right 02/21/24  Hip flexion 110 110    Hip extension 0 0    Hip abduction      Hip adduction      Hip internal rotation      Hip external rotation      Knee flexion 81 115 82 sitting 84 supine   Knee extension -10 0 -8 supine   Ankle dorsiflexion      Ankle plantarflexion      Ankle inversion      Ankle eversion                   (Blank rows = not tested)  LOWER EXTREMITY MMT: strength not tested resistively - moving R LE well against gravity   MMT Right eval Left eval  Hip flexion    Hip extension    Hip abduction    Hip adduction    Hip internal rotation    Hip external rotation    Knee flexion    Knee extension    Ankle dorsiflexion    Ankle plantarflexion    Ankle inversion    Ankle eversion     (Blank rows = not tested)  GAIT: Distance walked: 40 feet  Assistive device utilized: Single point cane Level of assistance: Complete Independence Comments: decreased wt bearing R LE in stance to toe off R LE    [facility] Adult PT Treatment:                                                DATE: 02/20/2024 Therapeutic Exercise: Self-massage  with 4 ball (Rt) hamstrings  Supine ITB & HS stretch variations with strap Neuromuscular re-ed: Supine: Quad set with foot propped on towel roll 10x5 Small range SLR 2x10 LAQ in ER + 3#AW 2x10 Therapeutic Activity: Seated knee slides with stretch at end range Supine heel slides with orange PB Staggered stance quad (Rt behind) Walking without SPC Stairs: Step up/down leading with Rt 4 step --> 6 step Ascend/descend stairs with reciprocal stepping 4 steps --> 6 steps     [facility] Adult PT Treatment:                                                DATE: 02/18/2024 Manual Therapy: IASTM (Rt) hamstrings, proximal gastroc Neuromuscular re-ed: Supine quad set with foot  propped on towel roll Seated knee extension with quad isometric kick into orange PB Weight shifting onto Rt LE Therapeutic Activity: NuStep L5 x 5 min for knee mobility + subjective intake Supine AAROM heel slides with strap Supine ITB stretch with strap + cues for postural awareness Knee flexion PROM to tolerable end range Step over low block fwd/bkwd  Gait: Gait training with SPC -> focus on hip/knee flexion on swing phase & heel strike    [facility] Adult PT Treatment:                                                DATE: 02/13/2024 Neuromuscular re-ed: Quad set --> foot on towel roll 10x5  SAQ with green bolster 10x5 Small range SLR 10x5 Squats --> mirror for postural awareness Therapeutic Activity: NuStep L4 x 5 min --> progressing knee mobility + subjective intake Seated knee flexion slides --> gradually lowering table Standing weight shifting: side, fwd, bkwd  Standing heel/toe raises Rt step over low block with weight shifting onto leg --> fwd/bkwd                                                                                                                         PATIENT EDUCATION:  Education details: POC; HEP  Person educated: Patient Education method: Programmer, multimedia, Demonstration, Tactile cues,  Verbal cues, and Handouts Education comprehension: verbalized understanding, returned demonstration, verbal cues required, tactile cues required, and needs further education  HOME EXERCISE PROGRAM: Access Code: [email or web address] URL: [email or web address] Date: 02/08/2024 Prepared by: Tawni Ferrier  Exercises - Supine Quad Set  - 1-2 x daily - 7 x weekly - 1 sets - 10 reps - 3 sec  hold - Small Range Straight Leg Raise  - 1-2 x daily - 7 x weekly - 1 sets - 10 reps - 5 sec  hold - Straight Leg Raise with External Rotation  - 1-2 x daily - 7 x weekly - 1 sets - 10 reps - 3-5 sec  hold - Supine Knee Extension Strengthening  - 1-2 x daily - 7 x weekly - 1-2 sets - 10 reps - 5 sec  hold - Supine Hip Adduction Isometric with Ball  - 1-2 x daily - 7 x weekly - 1 sets - 10 reps - 10 sec  hold - Supine Heel Slide with Strap  - 1-2 x daily - 7 x weekly - 1 sets - 5-10 reps - 10 sec  hold - Sidelying Hip Abduction  - 1 x daily - 7 x weekly - 3 sets - 10 reps - 3-5 sec  hold - Seated Heel Slide  - 1-2 x daily - 7 x weekly - 1 sets - 10 reps - 3 sec  hold - Seated Passive Knee Extension with Weight  - 1 x daily - 7 x weekly - 1 sets -  1 reps - Gastroc Stretch on Wall  - 1 x daily - 7 x weekly - 1 sets - 2 reps - 20-30 seconds hold  ASSESSMENT:  CLINICAL IMPRESSION: Improved gait mechanics demonstrated when walking with less circumduction compensation. Stair navigation progressed from low to standard step, focusing on increased stabilization and weight bearing on Rt LE. Modified squats to staggered stance to promote increased weight bearing on Rt LE.   EVAL: Patient is a 68 y.o. female who was seen today for physical therapy evaluation and treatment s/p R TKA 01/18/24 with no complications. She presents with abnormal gait pattern; decreased strength, mobility, ROM R LE; moderate edema R knee. Patient will benefit from PT to address problems identified and return to all normal functional and  recreational activities.    OBJECTIVE IMPAIRMENTS: Abnormal gait, decreased activity tolerance, decreased balance, decreased ROM, decreased strength, increased edema, impaired flexibility, obesity, and pain.    GOALS: Goals reviewed with patient? Yes  SHORT TERM GOALS: Target date: 03/05/2024  Independent in initial HEP  Baseline: Goal status: INITIAL  2.  Increase R knee ROM to -5 deg extension to 95 deg flexion  Baseline:  Goal status: INITIAL  3.  Improved gait pattern with equal wt bearing LE's Baseline:  Goal status: INITIAL   LONG TERM GOALS: Target date: 04/02/2024  Increase strength R LE to 4+/5 to 5/5  Baseline: moves LE well against gravity at least 3/5 Goal status: INITIAL  2.  Increase R knee ROM to 0 deg extension and 100-105 deg flexion  Baseline:  Goal status: INITIAL  3.  Independent ambulation without assistive device for household walking  Baseline:  Goal status: INITIAL  4.  Improve LEFS score by 10 points  Baseline:  Goal status: INITIAL  5.  Independent in HEP  Baseline:  Goal status: INITIAL  6.  Patient reports return to community based exercise program and water  exercise program she has used pre surgery  Baseline:  Goal status: INITIAL   PLAN:  PT FREQUENCY: 2x/week  PT DURATION: 8 weeks  PLANNED INTERVENTIONS: 97164- PT Re-evaluation, 97110-Therapeutic exercises, 97530- Therapeutic activity, 97112- Neuromuscular re-education, 97535- Self Care, [id number]- Manual therapy, [phone]- Gait training, [phone]- Aquatic Therapy, Patient/Family education, Balance training, Stair training, Taping, Joint mobilization, and Scar mobilization  PLAN FOR NEXT SESSION: knee flexion/extension; gait training; stairs; manual work and modalities as indicated   Lamarr GORMAN Price, PTA 02/20/2024, 2:53 PM

## 2024-02-21 NOTE — Telephone Encounter (Signed)
 Done. Patient aware.

## 2024-02-25 ENCOUNTER — Ambulatory Visit

## 2024-02-25 DIAGNOSIS — M6281 Muscle weakness (generalized): Secondary | ICD-10-CM

## 2024-02-25 DIAGNOSIS — R2689 Other abnormalities of gait and mobility: Secondary | ICD-10-CM

## 2024-02-25 DIAGNOSIS — M25561 Pain in right knee: Secondary | ICD-10-CM

## 2024-02-25 NOTE — Therapy (Signed)
 OUTPATIENT PHYSICAL THERAPY LOWER EXTREMITY TREATMENT   Patient Name: Michelle Kennedy MRN: [id number] DOB:1956-03-29, 68 y.o., female Today's Date: 02/25/2024  END OF SESSION:  PT End of Session - 02/25/24 1405     Visit Number 7    Number of Visits 16    Date for Recertification  04/02/24    Authorization Type Medicare & [employer] [place]    Progress Note Due on Visit 10    PT Start Time 1405    PT Stop Time 1450    PT Time Calculation (min) 45 min    Activity Tolerance Patient tolerated treatment well    Behavior During Therapy WFL for tasks assessed/performed         Past Medical History:  Diagnosis Date   Abnormally small mouth    Achilles tendon disorder, right    Allergy    Anemia    Anxiety    Arthritis    hips and knees   Asthma    daily inhaler, prn inhaler and neb.   Basal cell carcinoma    nose, s/p mohs.   Breast cancer (HCC) 01/2014   left   Cataract    bilateral - surgery   CHF (congestive heart failure) (HCC)    COPD (chronic obstructive pulmonary disease) (HCC)    Dental bridge present    upper front and lower right   Dental crowns present    x 3   Depression    Esophageal spasm    reports since her chemo and breast surgery  she developed esophageal spasms and reports this is in the past has casue her 02 to desat in the  70s , denies sycnope in relation to this , does report hx of vertigo as well    Family history of anesthesia complication    twin brother aspirated and died on OR table, per pt.   Fibromyalgia    Gallbladder disease    Gallstones    GERD (gastroesophageal reflux disease)    Glaucoma    H/O blood clots    had blood to  PICC line    History of gastric ulcer    as a teenager   History of seizure age 41   as a reaction to Penicillin - no seizures since   History of thyroid  cancer    s/p thyroidectomy   HTN (hypertension)    Hyperlipidemia    Hypothyroidism    Joint pain    Liver disorder    seen when she had her  hysterectomy , reports she was told it was   small lesion ; but asymptomatic    Lymphedema    left arm   Migraines    Multiple food allergies    Obesity    OSA (obstructive sleep apnea) 02/16/2016   CPAP- uses nightly   Palpitations    reports no longer experiences   Personal history of chemotherapy 2015   Personal history of radiation therapy 2015   Left   Pneumonia    x 1 years ago   Pre-diabetes    Rheumatoid arthritis (HCC)    SOB (shortness of breath)    Swelling of both ankles    Tinnitus    UTI (lower urinary tract infection) 02/17/2014   Vertigo    Vitamin D  deficiency    Wears hearing aid in both ears    hearing loss   Past Surgical History:  Procedure Laterality Date   ABDOMINAL HYSTERECTOMY  2004   complete  ACHILLES TENDON REPAIR Right    APPENDECTOMY  2004   BACK SURGERY  02/09/2023   at [facility]   bilateral eye surgery Bilateral 2022   glaucoma   bladder stimulator  08/29/2022   Medtronic bladder stinulator - with remote   BREAST EXCISIONAL BIOPSY     BREAST LUMPECTOMY Left    2015   BREAST LUMPECTOMY WITH NEEDLE LOCALIZATION AND AXILLARY SENTINEL LYMPH NODE BX Left 02/23/2014   Procedure: BREAST LUMPECTOMY WITH NEEDLE LOCALIZATION AND AXILLARY SENTINEL LYMPH NODE BIOPSY;  Surgeon: Morene Olives, MD;  Location: [facility];  Service: General;  Laterality: Left;   BREAST SURGERY  2011   left breast biposy   CARPAL TUNNEL RELEASE Bilateral    CHOLECYSTECTOMY  1990   COLONOSCOPY W/ POLYPECTOMY  06/2009   DECOMPRESSIVE LUMBAR LAMINECTOMY LEVEL 1 N/A 06/22/2023   Procedure: LUMBAR FOUR/FIVE LAMINECTOMY WITH FACET CYST EXCISION;  Surgeon: Georgina Ozell LABOR, MD;  Location: [facility];  Service: Orthopedics;  Laterality: N/A;   EYE SURGERY Right 2013   exc. warts from underneath eyelid   INCONTINENCE SURGERY  2004   KNEE ARTHROSCOPY Bilateral    x 6 each knee   LIGAMENT REPAIR Right    thumb/wrist   ORIF TOE FRACTURE  Right    great toe   PORTACATH PLACEMENT N/A 03/24/2014   Procedure: INSERTION PORT-A-CATH;  Surgeon: Morene Olives, MD;  Location: [facility];  Service: General;  Laterality: N/A;; removed    THYROIDECTOMY  2000   TONSILLECTOMY AND ADENOIDECTOMY  2000   TOTAL KNEE ARTHROPLASTY Left 12/03/2017   Procedure: LEFT TOTAL KNEE ARTHROPLASTY;  Surgeon: Melodi Lerner, MD;  Location: [facility];  Service: Orthopedics;  Laterality: Left;   TOTAL KNEE ARTHROPLASTY Right 01/18/2024   Procedure: ARTHROPLASTY, KNEE, TOTAL;  Surgeon: Vernetta Lonni GRADE, MD;  Location: [facility];  Service: Orthopedics;  Laterality: Right;   TUBAL LIGATION  1981   TUMOR EXCISION     from thoracic spine   Patient Active Problem List   Diagnosis Date Noted   Status post total right knee replacement 01/18/2024   Asthma exacerbation 09/28/2023   Acute asthma exacerbation 09/27/2023   Chronic pain syndrome 09/27/2023   Hypothyroidism 09/27/2023   History of CVA (cerebrovascular accident) 2017 09/27/2023   History of DVT (deep vein thrombosis) of right upper extremity 2015 09/27/2023   Status post laminectomy 06/22/2023   Cyst of lumbar facet joint 06/22/2023   Pre-op evaluation 06/05/2023   Status post insertion of nerve stimulator 03/19/2023   History of thyroid  cancer    SOB (shortness of breath) on exertion 10/03/2022   Polyphagia 04/27/2022   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   Abdominal pannus 03/02/2022   Vertigo 02/07/2022   Peroneal tendinitis of left lower extremity 02/07/2022   Visceral obesity 02/01/2022   Sacral pain 09/20/2021   Pain in right knee 09/13/2021   Chronic left-sided low back pain with left-sided sciatica 04/27/2021   Nondisplaced fracture of middle phalanx of left lesser toe(s), initial encounter for closed fracture 09/17/2020   Chronic diastolic CHF (congestive heart failure) (HCC) 08/23/2019   Retinal artery occlusion 08/22/2019   History of total left knee replacement  12/07/2017   Unilateral primary osteoarthritis, right knee 12/03/2017   Morbid obesity (HCC) 01/01/2017   Essential hypertension 10/18/2016   Constipation 10/04/2016   Vitamin D  deficiency 10/04/2016   Prediabetes 10/04/2016   BMI 39.0-39.9,adult 09/20/2016   OSA on CPAP 02/16/2016   Malignant neoplasm of  thyroid  gland (HCC) 10/11/2015   De Quervain's tenosynovitis, bilateral 09/15/2015   Trigger finger, acquired 06/22/2015   Chemotherapy-induced peripheral neuropathy 05/10/2015   Postmenopausal osteoporosis 12/08/2014   Preventative health care 12/01/2014   DVT (deep venous thrombosis) (HCC) 08/28/2014   Lymphedema of arm 08/28/2014   Mucositis due to chemotherapy 06/02/2014   Menopausal syndrome (hot flashes) 12/31/2013   Breast cancer of upper-outer quadrant of left female breast (HCC) 12/26/2013   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   Gingivitis 12/05/2013   Seasonal allergies 09/24/2013   Hyperlipidemia 06/02/2011   Migraine 06/02/2011   Tinnitus of both ears 02/14/2011   Fibromyalgia 05/10/2010   Asthma 03/22/2010   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   GERD 12/20/2009   Depression 12/01/2009    PCP: Eleanor Ponto, NP REFERRING PROVIDER: Dr Lonni Poli REFERRING DIAG: R TKA  THERAPY DIAG:  Acute pain of right knee  Muscle weakness (generalized)  Other abnormalities of gait and mobility  Rationale for Evaluation and Treatment: Rehabilitation  ONSET DATE: 01/18/24  SUBJECTIVE:   SUBJECTIVE STATEMENT: Patient arrived to session without cane; reports no pain for the most part but has discomfort on sides of knee when bending knee far. Patient states she is using her bike in the mornings; states she has walked approx 2,900 steps today without her cane.   EVAL: Patient reports that she has had pain in the R knee for years with bone spurs and bone on bone joint. She was treated for some time with injections but decided to  undergo elective R TKA on 8.29.25. no post op complications.   PERTINENT HISTORY: L TKA ~ 10 yrs ago; Malignant neoplasm L breast; achilles tendon disorder; anxiety; arthritis; asthma; cataract; CHF; COPD; fibromyapgia; gallbladder disease; history of blood clots; gastric ulcer; seizure; HTNhyperlipidemia; liver disorder; lymphedema L arm; migraines; obesity; OSA; prediabetic; swelling both ankles; tinnitus; vertigo PAIN:  Are you having pain? Yes: NPRS scale: 3/10  Pain location: R knee  Pain description: stiff; hurts Aggravating factors: standing > 10 min; walking > 1500 steps; late at night; awakens with pain at times Relieving factors: OTC meds; ice; heat on thigh   PRECAUTIONS: Other: hearing impaired reads lips; history of blood clots   WEIGHT BEARING RESTRICTIONS: No  FALLS:  Has patient fallen in last 6 months? 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  DIAGNOSTIC FINDINGS: xray R knee 01/18/24 IMPRESSION: Normal alignment of right total knee arthroplasty.  PATIENT SURVEYS:  LEFS  Extreme difficulty/unable (0), Quite a bit of difficulty (1), Moderate difficulty (2), Little difficulty (3), No difficulty (4) Survey date:    Any of your usual work, housework or school activities 0  2. Usual hobbies, recreational or sporting activities 0  3. Getting into/out of the bath 3  4. Walking between rooms 3  5. Putting on  socks/shoes 3  6. Squatting  0  7. Lifting an object, like a bag of groceries from the floor  0  8. Performing light activities around your home 1  9. Performing heavy activities around your home 0  10. Getting into/out of a car 3  11. Walking 2 blocks 0  12. Walking 1 mile 0  13. Going up/down 10 stairs (1 flight) 0  14. Standing for 1 hour 0  15.  sitting for 1 hour 0  16. Running on even ground 0  17. Running on uneven ground 0  18. Making sharp turns while running fast 0  19. Hopping  0  20. Rolling over in bed   Score total:  15     COGNITION: Overall cognitive status: Within functional limits for tasks assessed     SENSATION: Numbness in surgical site and tingling R knee   EDEMA:  Moderate edema R knee   MUSCLE LENGTH: Hamstrings: Right 80 deg; Left 85 deg  POSTURE: rounded shoulders, forward head, flexed trunk , and weight shift left   LOWER EXTREMITY ROM: Active ROM Right eval Left eval Right 02/08/24 Right 02/21/24  Hip flexion 110 110    Hip extension 0 0    Hip abduction      Hip adduction      Hip internal rotation      Hip external rotation      Knee flexion 81 115 82 sitting 84 supine   Knee extension -10 0 -8 supine   Ankle dorsiflexion      Ankle plantarflexion      Ankle inversion      Ankle eversion                   (Blank rows = not tested)  LOWER EXTREMITY MMT: strength not tested resistively - moving R LE well against gravity   MMT Right eval Left eval  Hip flexion    Hip extension    Hip abduction    Hip adduction    Hip internal rotation    Hip external rotation    Knee flexion    Knee extension    Ankle dorsiflexion    Ankle plantarflexion    Ankle inversion    Ankle eversion     (Blank rows = not tested)  GAIT: Distance walked: 40 feet  Assistive device utilized: Single point cane Level of assistance: Complete Independence Comments: decreased wt bearing R LE in stance to toe off R LE    [facility] Adult PT Treatment:                                                 DATE: 02/25/2024 Neuromuscular re-ed: Backwards walking Runner's step up 6 step (Rt SLS) + bilateral railing Therapeutic Activity: Rocking on recumbent bike x 5 min Standing:  Knee flexion Squats Staggered stance squat (Rt) foot back Prone passive knee extension hang  (folded towels above knee)  Sit to stand --> gradually lowering table height    [facility] Adult PT Treatment:                                                DATE: 02/20/2024 Therapeutic Exercise: Self-massage with 4 ball (Rt) hamstrings  Supine ITB & HS stretch variations with strap Neuromuscular re-ed: Supine: Quad set with foot propped on  towel roll 10x5 Small range SLR 2x10 LAQ in ER + 3#AW 2x10 Therapeutic Activity: Seated knee slides with stretch at end range Supine heel slides with orange PB Staggered stance quad (Rt behind) Walking without SPC Stairs: Step up/down leading with Rt 4 step --> 6 step Ascend/descend stairs with reciprocal stepping 4 steps --> 6 steps     [facility] Adult PT Treatment:                                                DATE: 02/18/2024 Manual Therapy: IASTM (Rt) hamstrings, proximal gastroc Neuromuscular re-ed: Supine quad set with foot propped on towel roll Seated knee extension with quad isometric kick into orange PB Weight shifting onto Rt LE Therapeutic Activity: NuStep L5 x 5 min for knee mobility + subjective intake Supine AAROM heel slides with strap Supine ITB stretch with strap + cues for postural awareness Knee flexion PROM to tolerable end range Step over low block fwd/bkwd  Gait: Gait training with SPC -> focus on hip/knee flexion on swing phase & heel strike                                                                                                                         PATIENT EDUCATION:  Education details: Prone hang  Person educated: Patient Education method: Explanation, Demonstration, Tactile cues,  Verbal cues, and Handouts Education comprehension: verbalized understanding, returned demonstration, verbal cues required, tactile cues required, and needs further education  HOME EXERCISE PROGRAM: Access Code: [email or web address] URL: [email or web address] Date: 02/25/2024 Prepared by: Lamarr Price  Exercises - Supine Quad Set  - 1-2 x daily - 7 x weekly - 1 sets - 10 reps - 3 sec  hold - Small Range Straight Leg Raise  - 1-2 x daily - 7 x weekly - 1 sets - 10 reps - 5 sec  hold - Straight Leg Raise with External Rotation  - 1-2 x daily - 7 x weekly - 1 sets - 10 reps - 3-5 sec  hold - Supine Knee Extension Strengthening  - 1-2 x daily - 7 x weekly - 1-2 sets - 10 reps - 5 sec  hold - Supine Hip Adduction Isometric with Ball  - 1-2 x daily - 7 x weekly - 1 sets - 10 reps - 10 sec  hold - Supine Heel Slide with Strap  - 1-2 x daily - 7 x weekly - 1 sets - 5-10 reps - 10 sec  hold - Sidelying Hip Abduction  - 1 x daily - 7 x weekly - 3 sets - 10 reps - 3-5 sec  hold - Seated Heel Slide  - 1-2 x daily - 7 x weekly - 1 sets - 10 reps - 3 sec  hold - Seated Passive Knee Extension with Weight  - 1 x daily - 7 x weekly -  1 sets - 1 reps - Gastroc Stretch on Wall  - 1 x daily - 7 x weekly - 1 sets - 2 reps - 20-30 seconds hold - Supine ITB Stretch with Strap  - 2-3 x daily - 7 x weekly - 1 sets - 3 reps - 30 sec hold - Prone Knee Extension Hang  - 2 x daily - 7 x weekly - 1 sets - 1-2 reps - 2-5 min hold  ASSESSMENT:  CLINICAL IMPRESSION: Prone knee extension hang incorporated to progress total knee extension ROM; patient tolerated well with no aggravation of symptoms and exercise added to HEP. Improved weight bearing on Rt LE demonstrated during squat activities. Patient continues to present with myofascial tension and tenderness at posterior knee along distal hamstrings; recommended patient continue ball work for fascial release and incorporate prone knee extension stretch to progress range.    EVAL: Patient is a 68 y.o. female who was seen today for physical therapy evaluation and treatment s/p R TKA 01/18/24 with no complications. She presents with abnormal gait pattern; decreased strength, mobility, ROM R LE; moderate edema R knee. Patient will benefit from PT to address problems identified and return to all normal functional and recreational activities.    OBJECTIVE IMPAIRMENTS: Abnormal gait, decreased activity tolerance, decreased balance, decreased ROM, decreased strength, increased edema, impaired flexibility, obesity, and pain.    GOALS: Goals reviewed with patient? Yes  SHORT TERM GOALS: Target date: 03/05/2024  Independent in initial HEP  Baseline: Goal status: INITIAL  2.  Increase R knee ROM to -5 deg extension to 95 deg flexion  Baseline:  Goal status: INITIAL  3.  Improved gait pattern with equal wt bearing LE's Baseline:  Goal status: INITIAL   LONG TERM GOALS: Target date: 04/02/2024  Increase strength R LE to 4+/5 to 5/5  Baseline: moves LE well against gravity at least 3/5 Goal status: INITIAL  2.  Increase R knee ROM to 0 deg extension and 100-105 deg flexion  Baseline:  Goal status: INITIAL  3.  Independent ambulation without assistive device for household walking  Baseline:  Goal status: INITIAL  4.  Improve LEFS score by 10 points  Baseline:  Goal status: INITIAL  5.  Independent in HEP  Baseline:  Goal status: INITIAL  6.  Patient reports return to community based exercise program and water  exercise program she has used pre surgery  Baseline:  Goal status: INITIAL   PLAN:  PT FREQUENCY: 2x/week  PT DURATION: 8 weeks  PLANNED INTERVENTIONS: 97164- PT Re-evaluation, 97110-Therapeutic exercises, 97530- Therapeutic activity, 97112- Neuromuscular re-education, 97535- Self Care, [id number]- Manual therapy, [phone]- Gait training, [phone]- Aquatic Therapy, Patient/Family education, Balance training, Stair training, Taping, Joint  mobilization, and Scar mobilization  PLAN FOR NEXT SESSION: knee flexion/extension; gait training; stairs; manual work and modalities as indicated   Lamarr GORMAN Price, PTA 02/25/2024, 2:53 PM

## 2024-02-27 ENCOUNTER — Ambulatory Visit

## 2024-02-27 DIAGNOSIS — M6281 Muscle weakness (generalized): Secondary | ICD-10-CM

## 2024-02-27 DIAGNOSIS — R2689 Other abnormalities of gait and mobility: Secondary | ICD-10-CM

## 2024-02-27 DIAGNOSIS — M25561 Pain in right knee: Secondary | ICD-10-CM | POA: Diagnosis not present

## 2024-02-27 NOTE — Patient Instructions (Signed)

## 2024-02-27 NOTE — Therapy (Signed)
 OUTPATIENT PHYSICAL THERAPY LOWER EXTREMITY TREATMENT   Patient Name: Michelle Kennedy MRN: [id number] DOB:1956/02/03, 68 y.o., female Today's Date: 02/27/2024  END OF SESSION:  PT End of Session - 02/27/24 1358     Visit Number 8    Number of Visits 16    Date for Recertification  04/02/24    Authorization Type Medicare & [employer] [place]    Progress Note Due on Visit 10    PT Start Time 1358    PT Stop Time 1500    PT Time Calculation (min) 62 min    Activity Tolerance Patient tolerated treatment well    Behavior During Therapy WFL for tasks assessed/performed         Past Medical History:  Diagnosis Date   Abnormally small mouth    Achilles tendon disorder, right    Allergy    Anemia    Anxiety    Arthritis    hips and knees   Asthma    daily inhaler, prn inhaler and neb.   Basal cell carcinoma    nose, s/p mohs.   Breast cancer (HCC) 01/2014   left   Cataract    bilateral - surgery   CHF (congestive heart failure) (HCC)    COPD (chronic obstructive pulmonary disease) (HCC)    Dental bridge present    upper front and lower right   Dental crowns present    x 3   Depression    Esophageal spasm    reports since her chemo and breast surgery  she developed esophageal spasms and reports this is in the past has casue her 02 to desat in the  70s , denies sycnope in relation to this , does report hx of vertigo as well    Family history of anesthesia complication    twin brother aspirated and died on OR table, per pt.   Fibromyalgia    Gallbladder disease    Gallstones    GERD (gastroesophageal reflux disease)    Glaucoma    H/O blood clots    had blood to  PICC line    History of gastric ulcer    as a teenager   History of seizure age 12   as a reaction to Penicillin - no seizures since   History of thyroid  cancer    s/p thyroidectomy   HTN (hypertension)    Hyperlipidemia    Hypothyroidism    Joint pain    Liver disorder    seen when she had her  hysterectomy , reports she was told it was   small lesion ; but asymptomatic    Lymphedema    left arm   Migraines    Multiple food allergies    Obesity    OSA (obstructive sleep apnea) 02/16/2016   CPAP- uses nightly   Palpitations    reports no longer experiences   Personal history of chemotherapy 2015   Personal history of radiation therapy 2015   Left   Pneumonia    x 1 years ago   Pre-diabetes    Rheumatoid arthritis (HCC)    SOB (shortness of breath)    Swelling of both ankles    Tinnitus    UTI (lower urinary tract infection) 02/17/2014   Vertigo    Vitamin D  deficiency    Wears hearing aid in both ears    hearing loss   Past Surgical History:  Procedure Laterality Date   ABDOMINAL HYSTERECTOMY  2004   complete  ACHILLES TENDON REPAIR Right    APPENDECTOMY  2004   BACK SURGERY  02/09/2023   at [facility]   bilateral eye surgery Bilateral 2022   glaucoma   bladder stimulator  08/29/2022   Medtronic bladder stinulator - with remote   BREAST EXCISIONAL BIOPSY     BREAST LUMPECTOMY Left    2015   BREAST LUMPECTOMY WITH NEEDLE LOCALIZATION AND AXILLARY SENTINEL LYMPH NODE BX Left 02/23/2014   Procedure: BREAST LUMPECTOMY WITH NEEDLE LOCALIZATION AND AXILLARY SENTINEL LYMPH NODE BIOPSY;  Surgeon: Morene Olives, MD;  Location: [facility];  Service: General;  Laterality: Left;   BREAST SURGERY  2011   left breast biposy   CARPAL TUNNEL RELEASE Bilateral    CHOLECYSTECTOMY  1990   COLONOSCOPY W/ POLYPECTOMY  06/2009   DECOMPRESSIVE LUMBAR LAMINECTOMY LEVEL 1 N/A 06/22/2023   Procedure: LUMBAR FOUR/FIVE LAMINECTOMY WITH FACET CYST EXCISION;  Surgeon: Georgina Ozell LABOR, MD;  Location: [facility];  Service: Orthopedics;  Laterality: N/A;   EYE SURGERY Right 2013   exc. warts from underneath eyelid   INCONTINENCE SURGERY  2004   KNEE ARTHROSCOPY Bilateral    x 6 each knee   LIGAMENT REPAIR Right    thumb/wrist   ORIF TOE FRACTURE  Right    great toe   PORTACATH PLACEMENT N/A 03/24/2014   Procedure: INSERTION PORT-A-CATH;  Surgeon: Morene Olives, MD;  Location: [facility];  Service: General;  Laterality: N/A;; removed    THYROIDECTOMY  2000   TONSILLECTOMY AND ADENOIDECTOMY  2000   TOTAL KNEE ARTHROPLASTY Left 12/03/2017   Procedure: LEFT TOTAL KNEE ARTHROPLASTY;  Surgeon: Melodi Lerner, MD;  Location: [facility];  Service: Orthopedics;  Laterality: Left;   TOTAL KNEE ARTHROPLASTY Right 01/18/2024   Procedure: ARTHROPLASTY, KNEE, TOTAL;  Surgeon: Vernetta Lonni GRADE, MD;  Location: [facility];  Service: Orthopedics;  Laterality: Right;   TUBAL LIGATION  1981   TUMOR EXCISION     from thoracic spine   Patient Active Problem List   Diagnosis Date Noted   Status post total right knee replacement 01/18/2024   Asthma exacerbation 09/28/2023   Acute asthma exacerbation 09/27/2023   Chronic pain syndrome 09/27/2023   Hypothyroidism 09/27/2023   History of CVA (cerebrovascular accident) 2017 09/27/2023   History of DVT (deep vein thrombosis) of right upper extremity 2015 09/27/2023   Status post laminectomy 06/22/2023   Cyst of lumbar facet joint 06/22/2023   Pre-op evaluation 06/05/2023   Status post insertion of nerve stimulator 03/19/2023   History of thyroid  cancer    SOB (shortness of breath) on exertion 10/03/2022   Polyphagia 04/27/2022   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   Abdominal pannus 03/02/2022   Vertigo 02/07/2022   Peroneal tendinitis of left lower extremity 02/07/2022   Visceral obesity 02/01/2022   Sacral pain 09/20/2021   Pain in right knee 09/13/2021   Chronic left-sided low back pain with left-sided sciatica 04/27/2021   Nondisplaced fracture of middle phalanx of left lesser toe(s), initial encounter for closed fracture 09/17/2020   Chronic diastolic CHF (congestive heart failure) (HCC) 08/23/2019   Retinal artery occlusion 08/22/2019   History of total left knee replacement  12/07/2017   Unilateral primary osteoarthritis, right knee 12/03/2017   Morbid obesity (HCC) 01/01/2017   Essential hypertension 10/18/2016   Constipation 10/04/2016   Vitamin D  deficiency 10/04/2016   Prediabetes 10/04/2016   BMI 39.0-39.9,adult 09/20/2016   OSA on CPAP 02/16/2016   Malignant neoplasm of  thyroid  gland (HCC) 10/11/2015   De Quervain's tenosynovitis, bilateral 09/15/2015   Trigger finger, acquired 06/22/2015   Chemotherapy-induced peripheral neuropathy 05/10/2015   Postmenopausal osteoporosis 12/08/2014   Preventative health care 12/01/2014   DVT (deep venous thrombosis) (HCC) 08/28/2014   Lymphedema of arm 08/28/2014   Mucositis due to chemotherapy 06/02/2014   Menopausal syndrome (hot flashes) 12/31/2013   Breast cancer of upper-outer quadrant of left female breast (HCC) 12/26/2013   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   Gingivitis 12/05/2013   Seasonal allergies 09/24/2013   Hyperlipidemia 06/02/2011   Migraine 06/02/2011   Tinnitus of both ears 02/14/2011   Fibromyalgia 05/10/2010   Asthma 03/22/2010   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   GERD 12/20/2009   Depression 12/01/2009    PCP: Eleanor Ponto, NP REFERRING PROVIDER: Dr Lonni Poli REFERRING DIAG: R TKA  THERAPY DIAG:  Acute pain of right knee  Muscle weakness (generalized)  Other abnormalities of gait and mobility  Rationale for Evaluation and Treatment: Rehabilitation  ONSET DATE: 01/18/24  SUBJECTIVE:   SUBJECTIVE STATEMENT: Patient reports she is feeling frustrated and depressed about lack of progress with knee mobility; states she was woken up last night from pain. Patient states she is compliant with HEP and is walking daily; has tried the passive knee extension stretch twice a day but does not feel like it is making much of a difference.   EVAL: Patient reports that she has had pain in the R knee for years with bone spurs and bone on bone  joint. She was treated for some time with injections but decided to undergo elective R TKA on 8.29.25. no post op complications.   PERTINENT HISTORY: L TKA ~ 10 yrs ago; Malignant neoplasm L breast; achilles tendon disorder; anxiety; arthritis; asthma; cataract; CHF; COPD; fibromyapgia; gallbladder disease; history of blood clots; gastric ulcer; seizure; HTNhyperlipidemia; liver disorder; lymphedema L arm; migraines; obesity; OSA; prediabetic; swelling both ankles; tinnitus; vertigo PAIN:  Are you having pain? Yes: NPRS scale: 3/10  Pain location: R knee  Pain description: stiff; hurts Aggravating factors: standing > 10 min; walking > 1500 steps; late at night; awakens with pain at times Relieving factors: OTC meds; ice; heat on thigh   PRECAUTIONS: Other: hearing impaired reads lips; history of blood clots   WEIGHT BEARING RESTRICTIONS: No  FALLS:  Has patient fallen in last 6 months? 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  DIAGNOSTIC FINDINGS: xray R knee 01/18/24 IMPRESSION: Normal alignment of right total knee arthroplasty.  PATIENT SURVEYS:  LEFS  Extreme difficulty/unable (0), Quite a bit of difficulty (1), Moderate difficulty (2), Little difficulty (3), No difficulty (4) Survey date:    Any of your usual work, housework or school activities 0  2. Usual hobbies, recreational or sporting activities 0  3. Getting  into/out of the bath 3  4. Walking between rooms 3  5. Putting on socks/shoes 3  6. Squatting  0  7. Lifting an object, like a bag of groceries from the floor 0  8. Performing light activities around your home 1  9. Performing heavy activities around your home 0  10. Getting into/out of a car 3  11. Walking 2 blocks 0  12. Walking 1 mile 0  13. Going up/down 10 stairs (1 flight) 0  14. Standing for 1 hour 0  15.  sitting for 1 hour 0  16. Running on even ground 0  17. Running on uneven ground 0  18. Making sharp turns while running fast 0  19. Hopping  0  20. Rolling over in bed   Score total:  15     COGNITION: Overall cognitive status: Within functional limits for tasks assessed     SENSATION: Numbness in surgical site and tingling R knee   EDEMA:  Moderate edema R knee   MUSCLE LENGTH: Hamstrings: Right 80 deg; Left 85 deg  POSTURE: rounded shoulders, forward head, flexed trunk , and weight shift left   LOWER EXTREMITY ROM: Active ROM Right eval Left eval Right 02/08/24 Right 02/27/24  Hip flexion 110 110    Hip extension 0 0    Hip abduction      Hip adduction      Hip internal rotation      Hip external rotation      Knee flexion 81 115 82 sitting 84 supine   Knee extension -10 0 -8 supine -5 supine  Ankle dorsiflexion      Ankle plantarflexion      Ankle inversion      Ankle eversion                   (Blank rows = not tested)  LOWER EXTREMITY MMT: strength not tested resistively - moving R LE well against gravity   MMT Right eval Left eval  Hip flexion    Hip extension    Hip abduction    Hip adduction    Hip internal rotation    Hip external rotation    Knee flexion    Knee extension    Ankle dorsiflexion    Ankle plantarflexion    Ankle inversion    Ankle eversion     (Blank rows = not tested)  GAIT: Distance walked: 40 feet  Assistive device utilized: Single point cane Level of assistance: Complete Independence Comments:  decreased wt bearing R LE in stance to toe off R LE    [facility] Adult PT Treatment:                                                DATE: 02/27/2024 Manual Therapy: IASTM along incision site STM posterior knee complex Neuromuscular re-ed: Contract/relax hamstring with overpressure for TKE Runner's step up 6 step (Rt SLS) + bilateral railing Standing TKE with ball Therapeutic Activity: Rocking on recumbent bike Seated knee flexion slides Standing squats NuStep --> gradually moving seat forward as tolerated    [facility] Adult PT Treatment:                                                DATE: 02/25/2024 Neuromuscular re-ed: Backwards walking Runner's step up 6 step (Rt SLS) + bilateral railing  Therapeutic Activity: Rocking on recumbent bike x 5 min Standing:  Knee flexion Squats Staggered stance squat (Rt) foot back Prone passive knee extension hang  (folded towels above knee)  Sit to stand --> gradually lowering table height    [facility] Adult PT Treatment:                                                DATE: 02/20/2024 Therapeutic Exercise: Self-massage with 4 ball (Rt) hamstrings  Supine ITB & HS stretch variations with strap Neuromuscular re-ed: Supine: Quad set with foot propped on towel roll 10x5 Small range SLR 2x10 LAQ in ER + 3#AW 2x10 Therapeutic Activity: Seated knee slides with stretch at end range Supine heel slides with orange PB Staggered stance quad (Rt behind) Walking without SPC Stairs: Step up/down leading with Rt 4 step --> 6 step Ascend/descend stairs with reciprocal stepping 4 steps --> 6 steps                                                                                                                        PATIENT EDUCATION:  Education details: Prone hang  Person educated: Patient Education method: Explanation, Demonstration, Tactile cues, Verbal cues, and Handouts Education comprehension: verbalized understanding, returned demonstration, verbal  cues required, tactile cues required, and needs further education  HOME EXERCISE PROGRAM: Access Code: [email or web address] URL: [email or web address] Date: 02/25/2024 Prepared by: Lamarr Price  Exercises - Supine Quad Set  - 1-2 x daily - 7 x weekly - 1 sets - 10 reps - 3 sec  hold - Small Range Straight Leg Raise  - 1-2 x daily - 7 x weekly - 1 sets - 10 reps - 5 sec  hold - Straight Leg Raise with External Rotation  - 1-2 x daily - 7 x weekly - 1 sets - 10 reps - 3-5 sec  hold - Supine Knee Extension Strengthening  - 1-2 x daily - 7 x weekly - 1-2 sets - 10 reps - 5 sec  hold - Supine Hip Adduction Isometric with Ball  - 1-2 x daily - 7 x weekly - 1 sets - 10 reps - 10 sec  hold - Supine Heel Slide with Strap  - 1-2 x daily - 7 x weekly - 1 sets - 5-10 reps - 10 sec  hold - Sidelying Hip Abduction  - 1 x daily - 7 x weekly - 3 sets - 10 reps - 3-5 sec  hold - Seated Heel Slide  - 1-2 x daily - 7 x weekly - 1 sets - 10 reps - 3 sec  hold - Seated Passive Knee Extension with Weight  - 1 x daily - 7 x weekly - 1 sets - 1 reps - Gastroc Stretch on Wall  - 1 x daily - 7 x weekly - 1 sets - 2 reps - 20-30  seconds hold - Supine ITB Stretch with Strap  - 2-3 x daily - 7 x weekly - 1 sets - 3 reps - 30 sec hold - Prone Knee Extension Hang  - 2 x daily - 7 x weekly - 1 sets - 1-2 reps - 2-5 min hold   Patient Education - Scar Massage  ASSESSMENT:  CLINICAL IMPRESSION: Tenderness with palpitation along medial distal hamstring and proximal gastroc; trigger points noted along musculature. Improved ease of knee mobility reported after manual interventions, as well as decreased pain at posterior knee complex. Continued functional quad activation and single leg stability exercises with less pain/discomfort in anterior knee reported. Patient continues to be most restricted with knee flexion due to tightness at front of knee along incision site and tightness at posterior knee with flexion; hamstring  flexibility within normal limits when stretching, however patient feels restriction primarily at medial posterior knee that limits total knee extension. Provided patient with information handout on dry needling, as well as information on costs for intervention; patient interested in added intervention at next visit with PT.   EVAL: Patient is a 68 y.o. female who was seen today for physical therapy evaluation and treatment s/p R TKA 01/18/24 with no complications. She presents with abnormal gait pattern; decreased strength, mobility, ROM R LE; moderate edema R knee. Patient will benefit from PT to address problems identified and return to all normal functional and recreational activities.    OBJECTIVE IMPAIRMENTS: Abnormal gait, decreased activity tolerance, decreased balance, decreased ROM, decreased strength, increased edema, impaired flexibility, obesity, and pain.    GOALS: Goals reviewed with patient? Yes  SHORT TERM GOALS: Target date: 03/05/2024  Independent in initial HEP  Baseline: Goal status: INITIAL  2.  Increase R knee ROM to -5 deg extension to 95 deg flexion  Baseline:  Goal status: INITIAL  3.  Improved gait pattern with equal wt bearing LE's Baseline:  Goal status: INITIAL   LONG TERM GOALS: Target date: 04/02/2024  Increase strength R LE to 4+/5 to 5/5  Baseline: moves LE well against gravity at least 3/5 Goal status: INITIAL  2.  Increase R knee ROM to 0 deg extension and 100-105 deg flexion  Baseline:  Goal status: INITIAL  3.  Independent ambulation without assistive device for household walking  Baseline:  Goal status: INITIAL  4.  Improve LEFS score by 10 points  Baseline:  Goal status: INITIAL  5.  Independent in HEP  Baseline:  Goal status: INITIAL  6.  Patient reports return to community based exercise program and water  exercise program she has used pre surgery  Baseline:  Goal status: INITIAL   PLAN:  PT FREQUENCY: 2x/week  PT  DURATION: 8 weeks  PLANNED INTERVENTIONS: [id number]- PT Re-evaluation, 97110-Therapeutic exercises, 97530- Therapeutic activity, 97112- Neuromuscular re-education, 97535- Self Care, [id number]- Manual therapy, [phone]- Gait training, [phone]- Aquatic Therapy, Patient/Family education, Balance training, Stair training, Taping, Joint mobilization, and Scar mobilization  PLAN FOR NEXT SESSION: Dry Needling next visit --> medial distal hamstring/proximal gastroc trigger points. Knee flexion/extension; gait training; stairs; manual work and modalities as indicated   Lamarr GORMAN Price, PTA 02/27/2024, 3:38 PM

## 2024-02-28 ENCOUNTER — Encounter: Payer: Self-pay | Admitting: Orthopaedic Surgery

## 2024-02-28 ENCOUNTER — Other Ambulatory Visit: Payer: Self-pay | Admitting: Orthopaedic Surgery

## 2024-02-28 ENCOUNTER — Ambulatory Visit: Admitting: Orthopaedic Surgery

## 2024-02-28 DIAGNOSIS — Z96651 Presence of right artificial knee joint: Secondary | ICD-10-CM

## 2024-02-28 MED ORDER — OXYCODONE HCL 5 MG PO TABS: 5.0000 mg | ORAL_TABLET | Freq: Four times a day (QID) | ORAL | 0 refills | Status: DC | PRN

## 2024-02-28 MED ORDER — TIZANIDINE HCL 2 MG PO TABS: 2.0000 mg | ORAL_TABLET | Freq: Four times a day (QID) | ORAL | 0 refills | Status: AC | PRN

## 2024-02-28 NOTE — Progress Notes (Signed)
 The is now 6 weeks status post a right total knee arthroplasty.  She had her left knee replaced back in 2019.  She feels like she is developing some scar tissue.  She is fair skinned and redhead.  On exam her right knee has almost full extension but I can only flex her to maybe 90 degrees.  Her left knee flexes to about 115 degrees.  The incision looks good.  There are some swelling to be expected.  Her calf is soft.  I did recommend a steroid injection in her right knee today to see if this would help with improving her range of motion.  Will refill her oxycodone  and tizanidine .  Will see her back in 2 weeks to assess her motion.  If she is not improving we would consider a manipulation under anesthesia and she understands this as well.

## 2024-03-01 ENCOUNTER — Other Ambulatory Visit: Payer: Self-pay | Admitting: Family

## 2024-03-03 ENCOUNTER — Ambulatory Visit: Admitting: Rehabilitative and Restorative Service Providers"

## 2024-03-03 ENCOUNTER — Encounter: Payer: Self-pay | Admitting: Rehabilitative and Restorative Service Providers"

## 2024-03-03 DIAGNOSIS — M25561 Pain in right knee: Secondary | ICD-10-CM | POA: Diagnosis not present

## 2024-03-03 DIAGNOSIS — M6281 Muscle weakness (generalized): Secondary | ICD-10-CM

## 2024-03-03 DIAGNOSIS — R2689 Other abnormalities of gait and mobility: Secondary | ICD-10-CM

## 2024-03-03 NOTE — Patient Instructions (Signed)

## 2024-03-03 NOTE — Therapy (Signed)
 OUTPATIENT PHYSICAL THERAPY LOWER EXTREMITY TREATMENT   Patient Name: Michelle Kennedy MRN: [id number] DOB:Nov 14, 1955, 68 y.o., female Today's Date: 03/03/2024  END OF SESSION:  PT End of Session - 03/03/24 1405     Visit Number 9    Number of Visits 16    Date for Recertification  04/02/24    Authorization Type Medicare & [employer] [place]    Progress Note Due on Visit 10    PT Start Time 1405    PT Stop Time 1445    PT Time Calculation (min) 40 min    Activity Tolerance Patient tolerated treatment well    Behavior During Therapy WFL for tasks assessed/performed          Past Medical History:  Diagnosis Date   Abnormally small mouth    Achilles tendon disorder, right    Allergy    Anemia    Anxiety    Arthritis    hips and knees   Asthma    daily inhaler, prn inhaler and neb.   Basal cell carcinoma    nose, s/p mohs.   Breast cancer (HCC) 01/2014   left   Cataract    bilateral - surgery   CHF (congestive heart failure) (HCC)    COPD (chronic obstructive pulmonary disease) (HCC)    Dental bridge present    upper front and lower right   Dental crowns present    x 3   Depression    Esophageal spasm    reports since her chemo and breast surgery  she developed esophageal spasms and reports this is in the past has casue her 02 to desat in the  70s , denies sycnope in relation to this , does report hx of vertigo as well    Family history of anesthesia complication    twin brother aspirated and died on OR table, per pt.   Fibromyalgia    Gallbladder disease    Gallstones    GERD (gastroesophageal reflux disease)    Glaucoma    H/O blood clots    had blood to  PICC line    History of gastric ulcer    as a teenager   History of seizure age 17   as a reaction to Penicillin - no seizures since   History of thyroid  cancer    s/p thyroidectomy   HTN (hypertension)    Hyperlipidemia    Hypothyroidism    Joint pain    Liver disorder    seen when she had her  hysterectomy , reports she was told it was   small lesion ; but asymptomatic    Lymphedema    left arm   Migraines    Multiple food allergies    Obesity    OSA (obstructive sleep apnea) 02/16/2016   CPAP- uses nightly   Palpitations    reports no longer experiences   Personal history of chemotherapy 2015   Personal history of radiation therapy 2015   Left   Pneumonia    x 1 years ago   Pre-diabetes    Rheumatoid arthritis (HCC)    SOB (shortness of breath)    Swelling of both ankles    Tinnitus    UTI (lower urinary tract infection) 02/17/2014   Vertigo    Vitamin D  deficiency    Wears hearing aid in both ears    hearing loss   Past Surgical History:  Procedure Laterality Date   ABDOMINAL HYSTERECTOMY  2004   complete  ACHILLES TENDON REPAIR Right    APPENDECTOMY  2004   BACK SURGERY  02/09/2023   at [facility]   bilateral eye surgery Bilateral 2022   glaucoma   bladder stimulator  08/29/2022   Medtronic bladder stinulator - with remote   BREAST EXCISIONAL BIOPSY     BREAST LUMPECTOMY Left    2015   BREAST LUMPECTOMY WITH NEEDLE LOCALIZATION AND AXILLARY SENTINEL LYMPH NODE BX Left 02/23/2014   Procedure: BREAST LUMPECTOMY WITH NEEDLE LOCALIZATION AND AXILLARY SENTINEL LYMPH NODE BIOPSY;  Surgeon: Morene Olives, MD;  Location: [facility];  Service: General;  Laterality: Left;   BREAST SURGERY  2011   left breast biposy   CARPAL TUNNEL RELEASE Bilateral    CHOLECYSTECTOMY  1990   COLONOSCOPY W/ POLYPECTOMY  06/2009   DECOMPRESSIVE LUMBAR LAMINECTOMY LEVEL 1 N/A 06/22/2023   Procedure: LUMBAR FOUR/FIVE LAMINECTOMY WITH FACET CYST EXCISION;  Surgeon: Georgina Ozell LABOR, MD;  Location: [facility];  Service: Orthopedics;  Laterality: N/A;   EYE SURGERY Right 2013   exc. warts from underneath eyelid   INCONTINENCE SURGERY  2004   KNEE ARTHROSCOPY Bilateral    x 6 each knee   LIGAMENT REPAIR Right    thumb/wrist   ORIF TOE FRACTURE  Right    great toe   PORTACATH PLACEMENT N/A 03/24/2014   Procedure: INSERTION PORT-A-CATH;  Surgeon: Morene Olives, MD;  Location: [facility];  Service: General;  Laterality: N/A;; removed    THYROIDECTOMY  2000   TONSILLECTOMY AND ADENOIDECTOMY  2000   TOTAL KNEE ARTHROPLASTY Left 12/03/2017   Procedure: LEFT TOTAL KNEE ARTHROPLASTY;  Surgeon: Melodi Lerner, MD;  Location: [facility];  Service: Orthopedics;  Laterality: Left;   TOTAL KNEE ARTHROPLASTY Right 01/18/2024   Procedure: ARTHROPLASTY, KNEE, TOTAL;  Surgeon: Vernetta Lonni GRADE, MD;  Location: [facility];  Service: Orthopedics;  Laterality: Right;   TUBAL LIGATION  1981   TUMOR EXCISION     from thoracic spine   Patient Active Problem List   Diagnosis Date Noted   Status post total right knee replacement 01/18/2024   Asthma exacerbation 09/28/2023   Acute asthma exacerbation 09/27/2023   Chronic pain syndrome 09/27/2023   Hypothyroidism 09/27/2023   History of CVA (cerebrovascular accident) 2017 09/27/2023   History of DVT (deep vein thrombosis) of right upper extremity 2015 09/27/2023   Status post laminectomy 06/22/2023   Cyst of lumbar facet joint 06/22/2023   Pre-op evaluation 06/05/2023   Status post insertion of nerve stimulator 03/19/2023   History of thyroid  cancer    SOB (shortness of breath) on exertion 10/03/2022   Polyphagia 04/27/2022   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   Abdominal pannus 03/02/2022   Vertigo 02/07/2022   Peroneal tendinitis of left lower extremity 02/07/2022   Visceral obesity 02/01/2022   Sacral pain 09/20/2021   Pain in right knee 09/13/2021   Chronic left-sided low back pain with left-sided sciatica 04/27/2021   Nondisplaced fracture of middle phalanx of left lesser toe(s), initial encounter for closed fracture 09/17/2020   Chronic diastolic CHF (congestive heart failure) (HCC) 08/23/2019   Retinal artery occlusion 08/22/2019   History of total left knee replacement  12/07/2017   Unilateral primary osteoarthritis, right knee 12/03/2017   Morbid obesity (HCC) 01/01/2017   Essential hypertension 10/18/2016   Constipation 10/04/2016   Vitamin D  deficiency 10/04/2016   Prediabetes 10/04/2016   BMI 39.0-39.9,adult 09/20/2016   OSA on CPAP 02/16/2016   Malignant neoplasm of  thyroid  gland (HCC) 10/11/2015   De Quervain's tenosynovitis, bilateral 09/15/2015   Trigger finger, acquired 06/22/2015   Chemotherapy-induced peripheral neuropathy 05/10/2015   Postmenopausal osteoporosis 12/08/2014   Preventative health care 12/01/2014   DVT (deep venous thrombosis) (HCC) 08/28/2014   Lymphedema of arm 08/28/2014   Mucositis due to chemotherapy 06/02/2014   Menopausal syndrome (hot flashes) 12/31/2013   Breast cancer of upper-outer quadrant of left female breast (HCC) 12/26/2013   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   Gingivitis 12/05/2013   Seasonal allergies 09/24/2013   Hyperlipidemia 06/02/2011   Migraine 06/02/2011   Tinnitus of both ears 02/14/2011   Fibromyalgia 05/10/2010   Asthma 03/22/2010   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   GERD 12/20/2009   Depression 12/01/2009    PCP: Eleanor Ponto, NP REFERRING PROVIDER: Dr Lonni Poli REFERRING DIAG: R TKA  THERAPY DIAG:  Acute pain of right knee  Muscle weakness (generalized)  Other abnormalities of gait and mobility  Rationale for Evaluation and Treatment: Rehabilitation  ONSET DATE: 01/18/24  SUBJECTIVE:   SUBJECTIVE STATEMENT: The patient underwent an injection when seeing MD last week and feels like swelling and pain are improved. She notes she will undergo manipulation under anesthesia if ROM doesn't improve.  EVAL: Patient reports that she has had pain in the R knee for years with bone spurs and bone on bone joint. She was treated for some time with injections but decided to undergo elective R TKA on 8.29.25. no post op complications.    PERTINENT HISTORY: L TKA ~ 10 yrs ago; Malignant neoplasm L breast; achilles tendon disorder; anxiety; arthritis; asthma; cataract; CHF; COPD; fibromyapgia; gallbladder disease; history of blood clots; gastric ulcer; seizure; HTNhyperlipidemia; liver disorder; lymphedema L arm; migraines; obesity; OSA; prediabetic; swelling both ankles; tinnitus; vertigo  PAIN:  Are you having pain? Yes: NPRS scale: 3/10  Pain location: R knee  Pain description: stiff; hurts Aggravating factors: standing > 10 min; walking > 1500 steps; late at night; awakens with pain at times Relieving factors: OTC meds; ice; heat on thigh   PRECAUTIONS: Other: hearing impaired reads lips; history of blood clots (NEW: patient notes that cold impacts whole body with fibromyalgia and she does not want vasopneumatic device   WEIGHT BEARING RESTRICTIONS: No  FALLS:  Has patient fallen in last 6 months? 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  DIAGNOSTIC FINDINGS: xray R knee 01/18/24 IMPRESSION: Normal alignment of right total knee arthroplasty.  PATIENT SURVEYS:  LEFS  Extreme difficulty/unable (0), Quite a bit of difficulty (1), Moderate difficulty (2), Little difficulty (3), No difficulty (4) Survey date:    Any of your usual work, housework or school activities 0  2. Usual hobbies, recreational or sporting activities 0  3. Getting into/out of the bath  3  4. Walking between rooms 3  5. Putting on socks/shoes 3  6. Squatting  0  7. Lifting an object, like a bag of groceries from  the floor 0  8. Performing light activities around your home 1  9. Performing heavy activities around your home 0  10. Getting into/out of a car 3  11. Walking 2 blocks 0  12. Walking 1 mile 0  13. Going up/down 10 stairs (1 flight) 0  14. Standing for 1 hour 0  15.  sitting for 1 hour 0  16. Running on even ground 0  17. Running on uneven ground 0  18. Making sharp turns while running fast 0  19. Hopping  0  20. Rolling over in bed   Score total:  15    COGNITION: Overall cognitive status: Within functional limits for tasks assessed     SENSATION: Numbness in surgical site and tingling R knee   EDEMA:  Moderate edema R knee   MUSCLE LENGTH: Hamstrings: Right 80 deg; Left 85 deg  POSTURE: rounded shoulders, forward head, flexed trunk , and weight shift left   LOWER EXTREMITY ROM: Active ROM Right eval Left eval Right 02/08/24 Right 02/27/24 Riht 03/03/24  Hip flexion 110 110     Hip extension 0 0     Hip abduction       Hip adduction       Hip internal rotation       Hip external rotation       Knee flexion 81 115 82 sitting 84 supine  100 degrees prone 95 degrees sitting AROM  Knee extension -10 0 -8 supine -5 supine   Ankle dorsiflexion       Ankle plantarflexion       Ankle inversion       Ankle eversion                      (Blank rows = not tested)  LOWER EXTREMITY MMT: strength not tested resistively - moving R LE well against gravity   MMT Right eval Left eval  Hip flexion    Hip extension    Hip abduction    Hip adduction    Hip internal rotation    Hip external rotation    Knee flexion    Knee extension    Ankle dorsiflexion    Ankle plantarflexion    Ankle inversion    Ankle eversion     (Blank rows = not tested)  GAIT: Distance walked: 40 feet  Assistive device utilized: Single point cane Level of  assistance: Complete Independence Comments: decreased wt bearing R LE in stance to toe off R LE   [facility] Adult PT Treatment:                                                DATE: 03/03/24 Therapeutic Exercise: Prone Knee flexion with passive overpressure Bike x 5 minutes with full revolutions  Standing Knee flexion x 10 reps R and L (to weight bear through R LE) Mini squats x 10 reps Heel raises x 10 reps Manual Therapy: STM and IASTM R quads, hamstrings, and patellar mobility PROM knee flexion  Contract/relax HS and quads x 5 reps with passive stretching/overpressure DRY NEEDLING Trigger Point Dry Needling Treatment: Pre-treatment instruction: Patient instructed on dry needling rationale, procedures, and possible side effects including pain during treatment (achy,cramping feeling), bruising, drop of blood, lightheadedness, nausea, sweating. Patient Consent Given: Yes Education handout provided: Yes Muscles treated: R hamstring stretch  Treatment response/outcome: Palpable decrease in muscle tension Post-treatment instructions: Patient instructed to expect possible mild to moderate muscle soreness later today and/or tomorrow. Patient instructed in methods to reduce muscle soreness and to continue prescribed HEP. If patient was dry needled over the lung field, patient was instructed on signs and symptoms of pneumothorax and, however unlikely, to see immediate medical attention should they occur. Patient was also educated on signs and symptoms of infection and to seek medical attention should they occur. Patient verbalized understanding of these instructions and education. Gait: Gait activities working on R heel strike and knee flexion to initiate swing phase x 100 ft x 2 reps   [facility] Adult PT Treatment:                                                DATE: 02/27/2024 Manual Therapy: IASTM along incision site STM posterior knee complex Neuromuscular re-ed: Contract/relax hamstring with  overpressure for TKE Runner's step up 6 step (Rt SLS) + bilateral railing Standing TKE with ball Therapeutic Activity: Rocking on recumbent bike Seated knee flexion slides Standing squats NuStep --> gradually moving seat forward as tolerated    [facility] Adult PT Treatment:                                                DATE: 02/25/2024 Neuromuscular re-ed: Backwards walking Runner's step up 6 step (Rt SLS) + bilateral railing Therapeutic Activity: Rocking on recumbent bike x 5 min Standing:  Knee flexion Squats Staggered stance squat (Rt) foot back Prone passive knee extension hang  (folded towels above knee)  Sit to stand --> gradually lowering table height    [facility] Adult PT Treatment:                                                DATE: 02/20/2024 Therapeutic Exercise: Self-massage with 4 ball (Rt) hamstrings  Supine ITB & HS stretch variations with strap Neuromuscular re-ed: Supine: Quad set with foot propped on towel roll 10x5 Small range SLR 2x10 LAQ in ER + 3#AW 2x10 Therapeutic Activity: Seated knee slides with stretch at end range Supine heel slides with orange PB Staggered stance quad (Rt behind) Walking without SPC Stairs: Step up/down leading with Rt 4 step --> 6 step Ascend/descend stairs with reciprocal stepping 4 steps --> 6 steps                                                                                                                        PATIENT EDUCATION:  Education details: Prone hang  Person educated: Patient Education  method: Explanation, Demonstration, Tactile cues, Verbal cues, and Handouts Education comprehension: verbalized understanding, returned demonstration, verbal cues required, tactile cues required, and needs further education  HOME EXERCISE PROGRAM: Access Code: [email or web address] URL: [email or web address] Date: 02/25/2024 Prepared by: Lamarr Price  Exercises - Supine Quad Set  - 1-2 x daily - 7 x weekly - 1 sets  - 10 reps - 3 sec  hold - Small Range Straight Leg Raise  - 1-2 x daily - 7 x weekly - 1 sets - 10 reps - 5 sec  hold - Straight Leg Raise with External Rotation  - 1-2 x daily - 7 x weekly - 1 sets - 10 reps - 3-5 sec  hold - Supine Knee Extension Strengthening  - 1-2 x daily - 7 x weekly - 1-2 sets - 10 reps - 5 sec  hold - Supine Hip Adduction Isometric with Ball  - 1-2 x daily - 7 x weekly - 1 sets - 10 reps - 10 sec  hold - Supine Heel Slide with Strap  - 1-2 x daily - 7 x weekly - 1 sets - 5-10 reps - 10 sec  hold - Sidelying Hip Abduction  - 1 x daily - 7 x weekly - 3 sets - 10 reps - 3-5 sec  hold - Seated Heel Slide  - 1-2 x daily - 7 x weekly - 1 sets - 10 reps - 3 sec  hold - Seated Passive Knee Extension with Weight  - 1 x daily - 7 x weekly - 1 sets - 1 reps - Gastroc Stretch on Wall  - 1 x daily - 7 x weekly - 1 sets - 2 reps - 20-30 seconds hold - Supine ITB Stretch with Strap  - 2-3 x daily - 7 x weekly - 1 sets - 3 reps - 30 sec hold - Prone Knee Extension Hang  - 2 x daily - 7 x weekly - 1 sets - 1-2 reps - 2-5 min hold   Patient Education - Scar Massage  ASSESSMENT:  CLINICAL IMPRESSION: The patient made gains in AROM after injection at MD last week. We proceeded with DN in R medial HS due to point tenderness. PT also performed contract/relax + PROM to continue to make gains. Patient able to complete a full revolution on the bike today. PT to continue to progress strengthening, flexibility, and ROM to tolerance.   EVAL: Patient is a 68 y.o. female who was seen today for physical therapy evaluation and treatment s/p R TKA 01/18/24 with no complications. She presents with abnormal gait pattern; decreased strength, mobility, ROM R LE; moderate edema R knee. Patient will benefit from PT to address problems identified and return to all normal functional and recreational activities.    OBJECTIVE IMPAIRMENTS: Abnormal gait, decreased activity tolerance, decreased balance, decreased  ROM, decreased strength, increased edema, impaired flexibility, obesity, and pain.    GOALS: Goals reviewed with patient? Yes  SHORT TERM GOALS: Target date: 03/05/2024  Independent in initial HEP  Baseline: Goal status: INITIAL  2.  Increase R knee ROM to -5 deg extension to 95 deg flexion  Baseline:  Goal status: INITIAL  3.  Improved gait pattern with equal wt bearing LE's Baseline:  Goal status: INITIAL   LONG TERM GOALS: Target date: 04/02/2024  Increase strength R LE to 4+/5 to 5/5  Baseline: moves LE well against gravity at least 3/5 Goal status: INITIAL  2.  Increase R knee ROM to 0 deg extension and 100-105  deg flexion  Baseline:  Goal status: INITIAL  3.  Independent ambulation without assistive device for household walking  Baseline:  Goal status: INITIAL  4.  Improve LEFS score by 10 points  Baseline:  Goal status: INITIAL  5.  Independent in HEP  Baseline:  Goal status: INITIAL  6.  Patient reports return to community based exercise program and water  exercise program she has used pre surgery  Baseline:  Goal status: INITIAL   PLAN:  PT FREQUENCY: 2x/week  PT DURATION: 8 weeks  PLANNED INTERVENTIONS: [id number]- PT Re-evaluation, 97110-Therapeutic exercises, 97530- Therapeutic activity, 97112- Neuromuscular re-education, 97535- Self Care, [id number]- Manual therapy, [phone]- Gait training, [phone]- Aquatic Therapy, Patient/Family education, Balance training, Stair training, Taping, Joint mobilization, and Scar mobilization  PLAN FOR NEXT SESSION: check STGs next visit, Knee flexion/extension; gait training; stairs; manual work and modalities as indicated   Milissa Fesperman, PT 03/03/2024, 3:48 PM

## 2024-03-05 ENCOUNTER — Ambulatory Visit

## 2024-03-05 DIAGNOSIS — R2689 Other abnormalities of gait and mobility: Secondary | ICD-10-CM

## 2024-03-05 DIAGNOSIS — M6281 Muscle weakness (generalized): Secondary | ICD-10-CM

## 2024-03-05 DIAGNOSIS — R29898 Other symptoms and signs involving the musculoskeletal system: Secondary | ICD-10-CM

## 2024-03-05 DIAGNOSIS — R252 Cramp and spasm: Secondary | ICD-10-CM

## 2024-03-05 DIAGNOSIS — M25561 Pain in right knee: Secondary | ICD-10-CM | POA: Diagnosis not present

## 2024-03-05 NOTE — Therapy (Addendum)
 OUTPATIENT PHYSICAL THERAPY LOWER EXTREMITY TREATMENT Progress Note Reporting Period 02/06/2024 to 03/05/2024  See note below for Objective Data and Assessment of Progress/Goals.      Patient Name: Michelle Kennedy MRN: [id number] DOB:08/12/1955, 68 y.o., female Today's Date: 03/05/2024  END OF SESSION:  PT End of Session - 03/05/24 1409     Visit Number 10    Number of Visits 16    Date for Recertification  04/02/24    Authorization Type Medicare & [employer] [place]    Progress Note Due on Visit 10    PT Start Time 1410   pt arrived late   PT Stop Time 1446    PT Time Calculation (min) 36 min    Activity Tolerance Patient tolerated treatment well    Behavior During Therapy WFL for tasks assessed/performed          Past Medical History:  Diagnosis Date   Abnormally small mouth    Achilles tendon disorder, right    Allergy    Anemia    Anxiety    Arthritis    hips and knees   Asthma    daily inhaler, prn inhaler and neb.   Basal cell carcinoma    nose, s/p mohs.   Breast cancer (HCC) 01/2014   left   Cataract    bilateral - surgery   CHF (congestive heart failure) (HCC)    COPD (chronic obstructive pulmonary disease) (HCC)    Dental bridge present    upper front and lower right   Dental crowns present    x 3   Depression    Esophageal spasm    reports since her chemo and breast surgery  she developed esophageal spasms and reports this is in the past has casue her 02 to desat in the  70s , denies sycnope in relation to this , does report hx of vertigo as well    Family history of anesthesia complication    twin brother aspirated and died on OR table, per pt.   Fibromyalgia    Gallbladder disease    Gallstones    GERD (gastroesophageal reflux disease)    Glaucoma    H/O blood clots    had blood to  PICC line    History of gastric ulcer    as a teenager   History of seizure age 55   as a reaction to Penicillin - no seizures since   History of thyroid   cancer    s/p thyroidectomy   HTN (hypertension)    Hyperlipidemia    Hypothyroidism    Joint pain    Liver disorder    seen when she had her hysterectomy , reports she was told it was   small lesion ; but asymptomatic    Lymphedema    left arm   Migraines    Multiple food allergies    Obesity    OSA (obstructive sleep apnea) 02/16/2016   CPAP- uses nightly   Palpitations    reports no longer experiences   Personal history of chemotherapy 2015   Personal history of radiation therapy 2015   Left   Pneumonia    x 1 years ago   Pre-diabetes    Rheumatoid arthritis (HCC)    SOB (shortness of breath)    Swelling of both ankles    Tinnitus    UTI (lower urinary tract infection) 02/17/2014   Vertigo    Vitamin D  deficiency    Wears hearing aid in both  ears    hearing loss   Past Surgical History:  Procedure Laterality Date   ABDOMINAL HYSTERECTOMY  2004   complete   ACHILLES TENDON REPAIR Right    APPENDECTOMY  2004   BACK SURGERY  02/09/2023   at [facility]   bilateral eye surgery Bilateral 2022   glaucoma   bladder stimulator  08/29/2022   Medtronic bladder stinulator - with remote   BREAST EXCISIONAL BIOPSY     BREAST LUMPECTOMY Left    2015   BREAST LUMPECTOMY WITH NEEDLE LOCALIZATION AND AXILLARY SENTINEL LYMPH NODE BX Left 02/23/2014   Procedure: BREAST LUMPECTOMY WITH NEEDLE LOCALIZATION AND AXILLARY SENTINEL LYMPH NODE BIOPSY;  Surgeon: Morene Olives, MD;  Location: [facility];  Service: General;  Laterality: Left;   BREAST SURGERY  2011   left breast biposy   CARPAL TUNNEL RELEASE Bilateral    CHOLECYSTECTOMY  1990   COLONOSCOPY W/ POLYPECTOMY  06/2009   DECOMPRESSIVE LUMBAR LAMINECTOMY LEVEL 1 N/A 06/22/2023   Procedure: LUMBAR FOUR/FIVE LAMINECTOMY WITH FACET CYST EXCISION;  Surgeon: Georgina Ozell LABOR, MD;  Location: [facility];  Service: Orthopedics;  Laterality: N/A;   EYE SURGERY Right 2013   exc. warts from underneath  eyelid   INCONTINENCE SURGERY  2004   KNEE ARTHROSCOPY Bilateral    x 6 each knee   LIGAMENT REPAIR Right    thumb/wrist   ORIF TOE FRACTURE Right    great toe   PORTACATH PLACEMENT N/A 03/24/2014   Procedure: INSERTION PORT-A-CATH;  Surgeon: Morene Olives, MD;  Location: [facility];  Service: General;  Laterality: N/A;; removed    THYROIDECTOMY  2000   TONSILLECTOMY AND ADENOIDECTOMY  2000   TOTAL KNEE ARTHROPLASTY Left 12/03/2017   Procedure: LEFT TOTAL KNEE ARTHROPLASTY;  Surgeon: Melodi Lerner, MD;  Location: [facility];  Service: Orthopedics;  Laterality: Left;   TOTAL KNEE ARTHROPLASTY Right 01/18/2024   Procedure: ARTHROPLASTY, KNEE, TOTAL;  Surgeon: Vernetta Lonni GRADE, MD;  Location: [facility];  Service: Orthopedics;  Laterality: Right;   TUBAL LIGATION  1981   TUMOR EXCISION     from thoracic spine   Patient Active Problem List   Diagnosis Date Noted   Status post total right knee replacement 01/18/2024   Asthma exacerbation 09/28/2023   Acute asthma exacerbation 09/27/2023   Chronic pain syndrome 09/27/2023   Hypothyroidism 09/27/2023   History of CVA (cerebrovascular accident) 2017 09/27/2023   History of DVT (deep vein thrombosis) of right upper extremity 2015 09/27/2023   Status post laminectomy 06/22/2023   Cyst of lumbar facet joint 06/22/2023   Pre-op evaluation 06/05/2023   Status post insertion of nerve stimulator 03/19/2023   History of thyroid  cancer    SOB (shortness of breath) on exertion 10/03/2022   Polyphagia 04/27/2022   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   Abdominal pannus 03/02/2022   Vertigo 02/07/2022   Peroneal tendinitis of left lower extremity 02/07/2022   Visceral obesity 02/01/2022   Sacral pain 09/20/2021   Pain in right knee 09/13/2021   Chronic left-sided low back pain with left-sided sciatica 04/27/2021   Nondisplaced fracture of middle phalanx of left lesser toe(s), initial encounter for closed fracture 09/17/2020    Chronic diastolic CHF (congestive heart failure) (HCC) 08/23/2019   Retinal artery occlusion 08/22/2019   History of total left knee replacement 12/07/2017   Unilateral primary osteoarthritis, right knee 12/03/2017   Morbid obesity (HCC) 01/01/2017   Essential hypertension 10/18/2016   Constipation 10/04/2016  Vitamin D  deficiency 10/04/2016   Prediabetes 10/04/2016   BMI 39.0-39.9,adult 09/20/2016   OSA on CPAP 02/16/2016   Malignant neoplasm of thyroid  gland (HCC) 10/11/2015   De Quervain's tenosynovitis, bilateral 09/15/2015   Trigger finger, acquired 06/22/2015   Chemotherapy-induced peripheral neuropathy 05/10/2015   Postmenopausal osteoporosis 12/08/2014   Preventative health care 12/01/2014   DVT (deep venous thrombosis) (HCC) 08/28/2014   Lymphedema of arm 08/28/2014   Mucositis due to chemotherapy 06/02/2014   Menopausal syndrome (hot flashes) 12/31/2013   Breast cancer of upper-outer quadrant of left female breast (HCC) 12/26/2013   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   Gingivitis 12/05/2013   Seasonal allergies 09/24/2013   Hyperlipidemia 06/02/2011   Migraine 06/02/2011   Tinnitus of both ears 02/14/2011   Fibromyalgia 05/10/2010   Asthma 03/22/2010   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   GERD 12/20/2009   Depression 12/01/2009    PCP: Eleanor Ponto, NP REFERRING PROVIDER: Dr Lonni Poli REFERRING DIAG: R TKA  THERAPY DIAG:  Acute pain of right knee  Muscle weakness (generalized)  Other abnormalities of gait and mobility  Cramp and spasm  Other symptoms and signs involving the musculoskeletal system  Rationale for Evaluation and Treatment: Rehabilitation  ONSET DATE: 01/18/24  SUBJECTIVE:   SUBJECTIVE STATEMENT: Patient reports her knee is still swollen but less pain. Patient states she was able to ride her stationary bike at home pedaling forward and backward. Patient states she felt looser in hamstring  after dry needling at last PT session. Patient reports she walked for 35 min in pool today for the first time; states she iced afterwards but felt good overall.  EVAL: Patient reports that she has had pain in the R knee for years with bone spurs and bone on bone joint. She was treated for some time with injections but decided to undergo elective R TKA on 8.29.25. no post op complications.   PERTINENT HISTORY: L TKA ~ 10 yrs ago; Malignant neoplasm L breast; achilles tendon disorder; anxiety; arthritis; asthma; cataract; CHF; COPD; fibromyapgia; gallbladder disease; history of blood clots; gastric ulcer; seizure; HTNhyperlipidemia; liver disorder; lymphedema L arm; migraines; obesity; OSA; prediabetic; swelling both ankles; tinnitus; vertigo  PAIN:  Are you having pain? Yes: NPRS scale: 0/10  Pain location: R knee  Pain description: stiff; hurts Aggravating factors: standing > 10 min; walking > 1500 steps; late at night; awakens with pain at times Relieving factors: OTC meds; ice; heat on thigh   PRECAUTIONS: Other: hearing impaired reads lips; history of blood clots (NEW: patient notes that cold impacts whole body with fibromyalgia and she does not want vasopneumatic device   WEIGHT BEARING RESTRICTIONS: No  FALLS:  Has patient fallen in last 6 months? 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  DIAGNOSTIC FINDINGS: xray R knee  01/18/24 IMPRESSION: Normal alignment of right total knee arthroplasty.  PATIENT SURVEYS:  LEFS  Extreme difficulty/unable (0), Quite a bit of difficulty (1),  Moderate difficulty (2), Little difficulty (3), No difficulty (4) Survey date:    Any of your usual work, housework or school activities 0  2. Usual hobbies, recreational or sporting activities 0  3. Getting into/out of the bath 3  4. Walking between rooms 3  5. Putting on socks/shoes 3  6. Squatting  0  7. Lifting an object, like a bag of groceries from the floor 0  8. Performing light activities around your home 1  9. Performing heavy activities around your home 0  10. Getting into/out of a car 3  11. Walking 2 blocks 0  12. Walking 1 mile 0  13. Going up/down 10 stairs (1 flight) 0  14. Standing for 1 hour 0  15.  sitting for 1 hour 0  16. Running on even ground 0  17. Running on uneven ground 0  18. Making sharp turns while running fast 0  19. Hopping  0  20. Rolling over in bed   Score total:  15    COGNITION: Overall cognitive status: Within functional limits for tasks assessed     SENSATION: Numbness in surgical site and tingling R knee   EDEMA:  Moderate edema R knee   MUSCLE LENGTH: Hamstrings: Right 80 deg; Left 85 deg  POSTURE: rounded shoulders, forward head, flexed trunk , and weight shift left   LOWER EXTREMITY ROM: Active ROM Right eval Left eval Right 02/08/24 Right 02/27/24 Right 03/03/24 Right 03/05/24  Hip flexion 110 110      Hip extension 0 0      Hip abduction        Hip adduction        Hip internal rotation        Hip external rotation        Knee flexion 81 115 82 sitting 84 supine  100 degrees prone 95 degrees sitting AROM 97 deg supine AROM  Knee extension -10 0 -8 supine -5 supine  -2 supine  Ankle dorsiflexion        Ankle plantarflexion        Ankle inversion        Ankle eversion                         (Blank rows = not tested)  LOWER EXTREMITY MMT: strength not  tested resistively - moving R LE well against gravity   MMT Right eval Left eval  Hip flexion    Hip extension    Hip abduction    Hip adduction    Hip internal rotation    Hip external rotation    Knee flexion    Knee extension    Ankle dorsiflexion    Ankle plantarflexion    Ankle inversion    Ankle eversion     (Blank rows = not tested)  GAIT: Distance walked: 40 feet  Assistive device utilized: Single point cane Level of assistance: Complete Independence Comments: decreased wt bearing R LE in stance to toe off R LE    [facility] Adult PT Treatment:                                                DATE: 03/05/2024 Therapeutic Activity: NuStep L6 x 5 min + subjective intake Supine knee flexion slides with orange PB rolling  Seated knee flexion  slides + stretch at end range Walking with equal weight shifting Stair navigation 4 step down  TKE with ball against wall    [facility] Adult PT Treatment:                                                DATE: 03/03/24 Therapeutic Exercise: Prone Knee flexion with passive overpressure Bike x 5 minutes with full revolutions  Standing Knee flexion x 10 reps R and L (to weight bear through R LE) Mini squats x 10 reps Heel raises x 10 reps Manual Therapy: STM and IASTM R quads, hamstrings, and patellar mobility PROM knee flexion  Contract/relax HS and quads x 5 reps with passive stretching/overpressure DRY NEEDLING Trigger Point Dry Needling Treatment: Pre-treatment instruction: Patient instructed on dry needling rationale, procedures, and possible side effects including pain during treatment (achy,cramping feeling), bruising, drop of blood, lightheadedness, nausea, sweating. Patient Consent Given: Yes Education handout provided: Yes Muscles treated: R hamstring stretch   Treatment response/outcome: Palpable decrease in muscle tension Post-treatment instructions: Patient instructed to expect possible mild to moderate muscle soreness  later today and/or tomorrow. Patient instructed in methods to reduce muscle soreness and to continue prescribed HEP. If patient was dry needled over the lung field, patient was instructed on signs and symptoms of pneumothorax and, however unlikely, to see immediate medical attention should they occur. Patient was also educated on signs and symptoms of infection and to seek medical attention should they occur. Patient verbalized understanding of these instructions and education. Gait: Gait activities working on R heel strike and knee flexion to initiate swing phase x 100 ft x 2 reps   [facility] Adult PT Treatment:                                                DATE: 02/27/2024 Manual Therapy: IASTM along incision site STM posterior knee complex Neuromuscular re-ed: Contract/relax hamstring with overpressure for TKE Runner's step up 6 step (Rt SLS) + bilateral railing Standing TKE with ball Therapeutic Activity: Rocking on recumbent bike Seated knee flexion slides Standing squats NuStep --> gradually moving seat forward as tolerated    [facility] Adult PT Treatment:                                                DATE: 02/25/2024 Neuromuscular re-ed: Backwards walking Runner's step up 6 step (Rt SLS) + bilateral railing Therapeutic Activity: Rocking on recumbent bike x 5 min Standing:  Knee flexion Squats Staggered stance squat (Rt) foot back Prone passive knee extension hang  (folded towels above knee)  Sit to stand --> gradually lowering table height  PATIENT EDUCATION:  Education details: Prone hang  Person educated: Patient Education method: Explanation, Demonstration, Tactile cues, Verbal cues, and Handouts Education comprehension: verbalized understanding, returned demonstration, verbal cues required, tactile cues required, and needs further education  HOME EXERCISE  PROGRAM: Access Code: [email or web address] URL: [email or web address] Date: 03/05/2024 Prepared by: Lamarr Price  Exercises - Sidelying Hip Abduction  - 1 x daily - 7 x weekly - 3 sets - 10 reps - 3-5 sec  hold - Seated Heel Slide  - 1-2 x daily - 7 x weekly - 1 sets - 10 reps - 3 sec  hold - Seated Passive Knee Extension with Weight  - 1 x daily - 7 x weekly - 1 sets - 1 reps - Gastroc Stretch on Wall  - 1 x daily - 7 x weekly - 1 sets - 2 reps - 20-30 seconds hold - Supine ITB Stretch with Strap  - 2-3 x daily - 7 x weekly - 1 sets - 3 reps - 30 sec hold - Single Leg Stance  - 2 x daily - 7 x weekly - 1 sets - 3-5 reps - 30 sec hold - Standing Terminal Knee Extension at Wall with Ball  - 2 x daily - 7 x weekly - 1 sets - 10 reps - 5-10 sec hold   ASSESSMENT:  CLINICAL IMPRESSION: Improved knee mobility noted throughout today's session, with 0 degrees knee total knee extension measured and 97 degrees knee flexion AROM. Gait mechanics improved with equal weight bearing noted between LE's. Functional standing strengthening exercises added to HEP and encouraged patient to continue pool walking/exercises and stationary bike at home. Patient will continue to benefit from skilled therapy to progress knee flexion AROM and functional LE strength.   EVAL: Patient is a 68 y.o. female who was seen today for physical therapy evaluation and treatment s/p R TKA 01/18/24 with no complications. She presents with abnormal gait pattern; decreased strength, mobility, ROM R LE; moderate edema R knee. Patient will benefit from PT to address problems identified and return to all normal functional and recreational activities.    OBJECTIVE IMPAIRMENTS: Abnormal gait, decreased activity tolerance, decreased balance, decreased ROM, decreased strength, increased edema, impaired flexibility, obesity, and pain.    GOALS: Goals reviewed with patient? Yes  SHORT TERM GOALS: Target date: 03/05/2024  Independent in  initial HEP  Baseline: Goal status: MET  2.  Increase R knee ROM to -5 deg extension to 95 deg flexion  Baseline:  03/05/24: 97 deg flexion, -2 deg extension Goal status: MET  3.  Improved gait pattern with equal wt bearing LE's Baseline:  Goal status: MET   LONG TERM GOALS: Target date: 04/02/2024  Increase strength R LE to 4+/5 to 5/5  Baseline: moves LE well against gravity at least 3/5 Goal status: INITIAL  2.  Increase R knee ROM to 0 deg extension and 100-105 deg flexion  Baseline:  Goal status: INITIAL  3.  Independent ambulation without assistive device for household walking  Baseline:  Goal status: MET  4.  Improve LEFS score by 10 points  Baseline:  Goal status: INITIAL  5.  Independent in HEP  Baseline:  Goal status: INITIAL  6.  Patient reports return to community based exercise program and water  exercise program she has used pre surgery  Baseline:  Goal status: INITIAL   PLAN:  PT FREQUENCY: 2x/week  PT DURATION: 8 weeks  PLANNED INTERVENTIONS: 97164- PT Re-evaluation, 97110-Therapeutic exercises, 97530- Therapeutic activity, [id number]- Neuromuscular re-education, 97535-  Self Care, [id number]- Manual therapy, [phone]- Gait training, [phone]- Aquatic Therapy, Patient/Family education, Balance training, Stair training, Taping, Joint mobilization, and Scar mobilization  PLAN FOR NEXT SESSION: Knee flexion/extension; gait training; stairs; manual work and modalities as indicated. Functional LE strengthening.    Lamarr GORMAN Price, PTA 03/05/2024, 4:36 PM

## 2024-03-06 ENCOUNTER — Other Ambulatory Visit: Payer: Self-pay | Admitting: Orthopaedic Surgery

## 2024-03-06 MED ORDER — OXYCODONE HCL 5 MG PO TABS: 5.0000 mg | ORAL_TABLET | Freq: Four times a day (QID) | ORAL | 0 refills | Status: AC | PRN

## 2024-03-10 ENCOUNTER — Ambulatory Visit: Admitting: Physical Therapy

## 2024-03-10 ENCOUNTER — Encounter: Payer: Self-pay | Admitting: Physical Therapy

## 2024-03-10 DIAGNOSIS — M6281 Muscle weakness (generalized): Secondary | ICD-10-CM

## 2024-03-10 DIAGNOSIS — R2689 Other abnormalities of gait and mobility: Secondary | ICD-10-CM

## 2024-03-10 DIAGNOSIS — M25561 Pain in right knee: Secondary | ICD-10-CM | POA: Diagnosis not present

## 2024-03-10 NOTE — Therapy (Addendum)
 OUTPATIENT PHYSICAL THERAPY LOWER EXTREMITY TREATMENT + NO VISIT DISCHARGE SUMMARY (see below)      Patient Name: Michelle Kennedy MRN: [id number] DOB:1956-04-25, 68 y.o., female Today's Date: 03/10/2024  END OF SESSION:  PT End of Session - 03/10/24 1445     Visit Number 11    Number of Visits 16    Date for Recertification  04/02/24    Authorization Type Medicare & [employer] [place]    Progress Note Due on Visit 10    PT Start Time 1445    PT Stop Time 1517    PT Time Calculation (min) 32 min           Past Medical History:  Diagnosis Date   Abnormally small mouth    Achilles tendon disorder, right    Allergy    Anemia    Anxiety    Arthritis    hips and knees   Asthma    daily inhaler, prn inhaler and neb.   Basal cell carcinoma    nose, s/p mohs.   Breast cancer (HCC) 01/2014   left   Cataract    bilateral - surgery   CHF (congestive heart failure) (HCC)    COPD (chronic obstructive pulmonary disease) (HCC)    Dental bridge present    upper front and lower right   Dental crowns present    x 3   Depression    Esophageal spasm    reports since her chemo and breast surgery  she developed esophageal spasms and reports this is in the past has casue her 02 to desat in the  70s , denies sycnope in relation to this , does report hx of vertigo as well    Family history of anesthesia complication    twin brother aspirated and died on OR table, per pt.   Fibromyalgia    Gallbladder disease    Gallstones    GERD (gastroesophageal reflux disease)    Glaucoma    H/O blood clots    had blood to  PICC line    History of gastric ulcer    as a teenager   History of seizure age 13   as a reaction to Penicillin - no seizures since   History of thyroid  cancer    s/p thyroidectomy   HTN (hypertension)    Hyperlipidemia    Hypothyroidism    Joint pain    Liver disorder    seen when she had her hysterectomy , reports she was told it was   small lesion ; but  asymptomatic    Lymphedema    left arm   Migraines    Multiple food allergies    Obesity    OSA (obstructive sleep apnea) 02/16/2016   CPAP- uses nightly   Palpitations    reports no longer experiences   Personal history of chemotherapy 2015   Personal history of radiation therapy 2015   Left   Pneumonia    x 1 years ago   Pre-diabetes    Rheumatoid arthritis (HCC)    SOB (shortness of breath)    Swelling of both ankles    Tinnitus    UTI (lower urinary tract infection) 02/17/2014   Vertigo    Vitamin D  deficiency    Wears hearing aid in both ears    hearing loss   Past Surgical History:  Procedure Laterality Date   ABDOMINAL HYSTERECTOMY  2004   complete   ACHILLES TENDON REPAIR Right  APPENDECTOMY  2004   BACK SURGERY  02/09/2023   at [facility]   bilateral eye surgery Bilateral 2022   glaucoma   bladder stimulator  08/29/2022   Medtronic bladder stinulator - with remote   BREAST EXCISIONAL BIOPSY     BREAST LUMPECTOMY Left    2015   BREAST LUMPECTOMY WITH NEEDLE LOCALIZATION AND AXILLARY SENTINEL LYMPH NODE BX Left 02/23/2014   Procedure: BREAST LUMPECTOMY WITH NEEDLE LOCALIZATION AND AXILLARY SENTINEL LYMPH NODE BIOPSY;  Surgeon: Morene Olives, MD;  Location: [facility];  Service: General;  Laterality: Left;   BREAST SURGERY  2011   left breast biposy   CARPAL TUNNEL RELEASE Bilateral    CHOLECYSTECTOMY  1990   COLONOSCOPY W/ POLYPECTOMY  06/2009   DECOMPRESSIVE LUMBAR LAMINECTOMY LEVEL 1 N/A 06/22/2023   Procedure: LUMBAR FOUR/FIVE LAMINECTOMY WITH FACET CYST EXCISION;  Surgeon: Georgina Ozell LABOR, MD;  Location: [facility];  Service: Orthopedics;  Laterality: N/A;   EYE SURGERY Right 2013   exc. warts from underneath eyelid   INCONTINENCE SURGERY  2004   KNEE ARTHROSCOPY Bilateral    x 6 each knee   LIGAMENT REPAIR Right    thumb/wrist   ORIF TOE FRACTURE Right    great toe   PORTACATH PLACEMENT N/A 03/24/2014    Procedure: INSERTION PORT-A-CATH;  Surgeon: Morene Olives, MD;  Location: [facility];  Service: General;  Laterality: N/A;; removed    THYROIDECTOMY  2000   TONSILLECTOMY AND ADENOIDECTOMY  2000   TOTAL KNEE ARTHROPLASTY Left 12/03/2017   Procedure: LEFT TOTAL KNEE ARTHROPLASTY;  Surgeon: Melodi Lerner, MD;  Location: [facility];  Service: Orthopedics;  Laterality: Left;   TOTAL KNEE ARTHROPLASTY Right 01/18/2024   Procedure: ARTHROPLASTY, KNEE, TOTAL;  Surgeon: Vernetta Lonni GRADE, MD;  Location: [facility];  Service: Orthopedics;  Laterality: Right;   TUBAL LIGATION  1981   TUMOR EXCISION     from thoracic spine   Patient Active Problem List   Diagnosis Date Noted   Status post total right knee replacement 01/18/2024   Asthma exacerbation 09/28/2023   Acute asthma exacerbation 09/27/2023   Chronic pain syndrome 09/27/2023   Hypothyroidism 09/27/2023   History of CVA (cerebrovascular accident) 2017 09/27/2023   History of DVT (deep vein thrombosis) of right upper extremity 2015 09/27/2023   Status post laminectomy 06/22/2023   Cyst of lumbar facet joint 06/22/2023   Pre-op evaluation 06/05/2023   Status post insertion of nerve stimulator 03/19/2023   History of thyroid  cancer    SOB (shortness of breath) on exertion 10/03/2022   Polyphagia 04/27/2022   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   Abdominal pannus 03/02/2022   Vertigo 02/07/2022   Peroneal tendinitis of left lower extremity 02/07/2022   Visceral obesity 02/01/2022   Sacral pain 09/20/2021   Pain in right knee 09/13/2021   Chronic left-sided low back pain with left-sided sciatica 04/27/2021   Nondisplaced fracture of middle phalanx of left lesser toe(s), initial encounter for closed fracture 09/17/2020   Chronic diastolic CHF (congestive heart failure) (HCC) 08/23/2019   Retinal artery occlusion 08/22/2019   History of total left knee replacement 12/07/2017   Unilateral primary osteoarthritis, right knee  12/03/2017   Morbid obesity (HCC) 01/01/2017   Essential hypertension 10/18/2016   Constipation 10/04/2016   Vitamin D  deficiency 10/04/2016   Prediabetes 10/04/2016   BMI 39.0-39.9,adult 09/20/2016   OSA on CPAP 02/16/2016   Malignant neoplasm of thyroid  gland (HCC) 10/11/2015   Everitt  Quervain's tenosynovitis, bilateral 09/15/2015   Trigger finger, acquired 06/22/2015   Chemotherapy-induced peripheral neuropathy 05/10/2015   Postmenopausal osteoporosis 12/08/2014   Preventative health care 12/01/2014   DVT (deep venous thrombosis) (HCC) 08/28/2014   Lymphedema of arm 08/28/2014   Mucositis due to chemotherapy 06/02/2014   Menopausal syndrome (hot flashes) 12/31/2013   Breast cancer of upper-outer quadrant of left female breast (HCC) 12/26/2013   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   Gingivitis 12/05/2013   Seasonal allergies 09/24/2013   Hyperlipidemia 06/02/2011   Migraine 06/02/2011   Tinnitus of both ears 02/14/2011   Fibromyalgia 05/10/2010   Asthma 03/22/2010   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   GERD 12/20/2009   Depression 12/01/2009    PCP: Eleanor Ponto, NP REFERRING PROVIDER: Dr Lonni Poli REFERRING DIAG: R TKA  THERAPY DIAG:  Acute pain of right knee  Muscle weakness (generalized)  Other abnormalities of gait and mobility  Rationale for Evaluation and Treatment: Rehabilitation  ONSET DATE: 01/18/24  SUBJECTIVE:   SUBJECTIVE STATEMENT: 03/10/2024: feeling perfect. States she was active with walking and water  aerobics earlier today, denies any significant limitations d/t her knee. States she feels like she may be ready to discharge, follows up with provider later this week.   EVAL: Patient reports that she has had pain in the R knee for years with bone spurs and bone on bone joint. She was treated for some time with injections but decided to undergo elective R TKA on 8.29.25. no post op complications.   PERTINENT  HISTORY: L TKA ~ 10 yrs ago; Malignant neoplasm L breast; achilles tendon disorder; anxiety; arthritis; asthma; cataract; CHF; COPD; fibromyapgia; gallbladder disease; history of blood clots; gastric ulcer; seizure; HTNhyperlipidemia; liver disorder; lymphedema L arm; migraines; obesity; OSA; prediabetic; swelling both ankles; tinnitus; vertigo  PAIN:  Are you having pain? Yes: NPRS scale: 0/10  Pain location: R knee  Pain description: stiff; hurts Aggravating factors: standing > 10 min; walking > 1500 steps; late at night; awakens with pain at times Relieving factors: OTC meds; ice; heat on thigh   PRECAUTIONS: Other: hearing impaired reads lips; history of blood clots (NEW: patient notes that cold impacts whole body with fibromyalgia and she does not want vasopneumatic device   WEIGHT BEARING RESTRICTIONS: No  FALLS:  Has patient fallen in last 6 months? 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  DIAGNOSTIC FINDINGS: xray R knee 01/18/24 IMPRESSION: Normal alignment of right total knee arthroplasty.  PATIENT SURVEYS:  LEFS  Extreme difficulty/unable (0), Quite a bit of difficulty (1), Moderate difficulty (2), Little difficulty (3), No difficulty (4) Survey date:    Any of your usual work, housework or school activities 0  2. Usual hobbies, recreational or sporting activities 0  3. Getting into/out of the bath 3  4.  Walking between rooms 3  5. Putting on socks/shoes 3  6. Squatting  0  7. Lifting an object, like a bag of  groceries from the floor 0  8. Performing light activities around your home 1  9. Performing heavy activities around your home 0  10. Getting into/out of a car 3  11. Walking 2 blocks 0  12. Walking 1 mile 0  13. Going up/down 10 stairs (1 flight) 0  14. Standing for 1 hour 0  15.  sitting for 1 hour 0  16. Running on even ground 0  17. Running on uneven ground 0  18. Making sharp turns while running fast 0  19. Hopping  0  20. Rolling over in bed   Score total:  15       LEFS 03/10/24: 57/80   COGNITION: Overall cognitive status: Within functional limits for tasks assessed     SENSATION: Numbness in surgical site and tingling R knee   EDEMA:  Moderate edema R knee   MUSCLE LENGTH: Hamstrings: Right 80 deg; Left 85 deg  POSTURE: rounded shoulders, forward head, flexed trunk , and weight shift left   LOWER EXTREMITY ROM: Active ROM Right eval Left eval Right 02/08/24 Right 02/27/24 Right 03/03/24 Right 03/05/24 R 03/10/24  Hip flexion 110 110       Hip extension 0 0       Hip abduction         Hip adduction         Hip internal rotation         Hip external rotation         Knee flexion 81 115 82 sitting 84 supine  100 degrees prone 95 degrees sitting AROM 97 deg supine AROM 95 deg A 99 deg passive  Knee extension -10 0 -8 supine -5 supine  -2 supine - 2 supine  Ankle dorsiflexion         Ankle plantarflexion         Ankle inversion         Ankle eversion                            (Blank rows = not tested)  LOWER EXTREMITY MMT: strength not tested resistively - moving R LE well against gravity   MMT Right eval Left eval R/L 03/10/24  Hip flexion     Hip extension     Hip abduction     Hip adduction     Hip internal rotation     Hip external rotation     Knee flexion   4-/4+  Knee extension   4-/4+  Ankle dorsiflexion     Ankle  plantarflexion     Ankle inversion     Ankle eversion      (Blank rows = not tested)  GAIT: Distance walked: 40 feet  Assistive device utilized: Single point cane Level of assistance: Complete Independence Comments: decreased wt bearing R LE in stance to toe off R LE    [facility] Adult PT Treatment:                                                DATE: 03/10/24 Therapeutic Exercise: HEP update + education/discussion w/ handout, rationale for interventions; pt politely declines practice in clinic but it is verbally reviewed  Therapeutic Activity: MSK assessment + education Education/discussion re: progress with PT, symptom behavior as it affects activity tolerance, PT goals/POC, activity  modification/progression as indicated, communication w/ provider, rationale for PT intervention    [facility] Adult PT Treatment:                                                DATE: 03/05/2024 Therapeutic Activity: NuStep L6 x 5 min + subjective intake Supine knee flexion slides with orange PB rolling  Seated knee flexion slides + stretch at end range Walking with equal weight shifting Stair navigation 4 step down  TKE with ball against wall    [facility] Adult PT Treatment:                                                DATE: 03/03/24 Therapeutic Exercise: Prone Knee flexion with passive overpressure Bike x 5 minutes with full revolutions  Standing Knee flexion x 10 reps R and L (to weight bear through R LE) Mini squats x 10 reps Heel raises x 10 reps Manual Therapy: STM and IASTM R quads, hamstrings, and patellar mobility PROM knee flexion  Contract/relax HS and quads x 5 reps with passive stretching/overpressure DRY NEEDLING Trigger Point Dry Needling Treatment: Pre-treatment instruction: Patient instructed on dry needling rationale, procedures, and possible side effects including pain during treatment (achy,cramping feeling), bruising, drop of blood, lightheadedness, nausea, sweating. Patient  Consent Given: Yes Education handout provided: Yes Muscles treated: R hamstring stretch   Treatment response/outcome: Palpable decrease in muscle tension Post-treatment instructions: Patient instructed to expect possible mild to moderate muscle soreness later today and/or tomorrow. Patient instructed in methods to reduce muscle soreness and to continue prescribed HEP. If patient was dry needled over the lung field, patient was instructed on signs and symptoms of pneumothorax and, however unlikely, to see immediate medical attention should they occur. Patient was also educated on signs and symptoms of infection and to seek medical attention should they occur. Patient verbalized understanding of these instructions and education. Gait: Gait activities working on R heel strike and knee flexion to initiate swing phase x 100 ft x 2 reps                                                                                                                          PATIENT EDUCATION:  Education details: rationale for interventions, HEP  Person educated: Patient Education method: Explanation, Demonstration, Tactile cues, Verbal cues Education comprehension: verbalized understanding, returned demonstration, verbal cues required, tactile cues required, and needs further education     HOME EXERCISE PROGRAM: Access Code: [email or web address] URL: [email or web address] Date: 03/10/2024 Prepared by: Alm Jenny  Exercises - Seated Heel Slide  - 1-2 x daily - 7 x weekly - 1 sets - 10 reps - 3 sec  hold - Seated Passive Knee Extension  with Weight  - 1 x daily - 7 x weekly - 1 sets - 1 reps - Standing Terminal Knee Extension at Wall with Ball  - 2 x daily - 7 x weekly - 1 sets - 10 reps - 5-10 sec hold - Single Leg Stance  - 2 x daily - 7 x weekly - 1 sets - 3-5 reps - 30 sec hold - Sidelying Hip Abduction  - 1 x daily - 7 x weekly - 3 sets - 10 reps - 3-5 sec  hold - Gastroc Stretch on Wall  - 1 x daily - 7  x weekly - 1 sets - 2 reps - 20-30 seconds hold - Supine ITB Stretch with Strap  - 2-3 x daily - 7 x weekly - 1 sets - 3 reps - 30 sec hold   ASSESSMENT:  CLINICAL IMPRESSION: 03/10/2024: Pt arrives and states she feels like she may be ready to discharge, denies significant functional limitations d/t her knee and has returned to usual tasks. Looking at LTG she has made excellent improvements compared to start of care but continues to have deficits with knee mobility, particularly flexion. Discussed with pt that further PT may be beneficial in maximizing ROM - she states at present she is pleased with functional status and she would like to place PT on hold for now, will follow up with provider. HEP updated w/ verbal review, pt politely declines full practice in clinic. She verbalizes agreement/understanding w/ plan for PT on hold w/ trial of independent HEP at this time, further provider follow up. No adverse events, tolerates testing well. Pt departs today's session in no acute distress, all voiced questions/concerns addressed appropriately from PT perspective.    EVAL: Patient is a 68 y.o. female who was seen today for physical therapy evaluation and treatment s/p R TKA 01/18/24 with no complications. She presents with abnormal gait pattern; decreased strength, mobility, ROM R LE; moderate edema R knee. Patient will benefit from PT to address problems identified and return to all normal functional and recreational activities.    OBJECTIVE IMPAIRMENTS: Abnormal gait, decreased activity tolerance, decreased balance, decreased ROM, decreased strength, increased edema, impaired flexibility, obesity, and pain.    GOALS: Goals reviewed with patient? Yes  SHORT TERM GOALS: Target date: 03/05/2024  Independent in initial HEP  Baseline: Goal status: MET  2.  Increase R knee ROM to -5 deg extension to 95 deg flexion  Baseline:  03/05/24: 97 deg flexion, -2 deg extension Goal status: MET  3.   Improved gait pattern with equal wt bearing LE's Baseline:  Goal status: MET   LONG TERM GOALS: Target date: 04/02/2024  Increase strength R LE to 4+/5 to 5/5  Baseline: moves LE well against gravity at least 3/5 03/10/24: see MMT chart above Goal status: PROGRESSING   2.  Increase R knee ROM to 0 deg extension and 100-105 deg flexion  Baseline:  03/10/24: -2-99deg  Goal status: NEARLY MET   3.  Independent ambulation without assistive device for household walking  Baseline:  Goal status: MET  4.  Improve LEFS score by 10 points  Baseline:  03/10/24: 57/80  Goal status: MET  5.  Independent in HEP  Baseline:  03/10/24: reports independence w/ HEP Goal status: MET  6.  Patient reports return to community based exercise program and water  exercise program she has used pre surgery  Baseline:  03/10/24: states she has returned to gym/aerobic exercise  Goal status: MET   PLAN:  PT FREQUENCY: 2x/week  PT DURATION: 8 weeks  PLANNED INTERVENTIONS: 97164- PT Re-evaluation, 97110-Therapeutic exercises, 97530- Therapeutic activity, 97112- Neuromuscular re-education, [phone]- Self Care, 02859- Manual therapy, [phone]- Gait training, [phone]- Aquatic Therapy, Patient/Family education, Balance training, Stair training, Taping, Joint mobilization, and Scar mobilization  PLAN FOR NEXT SESSION: PT on hold as of 03/10/24   Alm DELENA Jenny PT, DPT 03/10/2024 3:31 PM      Discharge addendum 05/06/2024  PHYSICAL THERAPY DISCHARGE SUMMARY  Visits from Start of Care: 11  Current functional level related to goals / functional outcomes: Unable to be assessed   Remaining deficits: Unable to be assessed   Education / Equipment: Unable to be assessed  Patient goals were unable to be assessed. Patient is being discharged due to not returning since the last visit.   Alm DELENA Jenny PT, DPT 05/06/2024 11:42 AM

## 2024-03-13 ENCOUNTER — Encounter: Admitting: Rehabilitative and Restorative Service Providers"

## 2024-03-13 ENCOUNTER — Ambulatory Visit (INDEPENDENT_AMBULATORY_CARE_PROVIDER_SITE_OTHER): Admitting: Physician Assistant

## 2024-03-13 ENCOUNTER — Encounter: Payer: Self-pay | Admitting: Physician Assistant

## 2024-03-13 DIAGNOSIS — Z96651 Presence of right artificial knee joint: Secondary | ICD-10-CM

## 2024-03-13 NOTE — Progress Notes (Signed)
 HPI: Michelle Kennedy returns today follow-up status post right total knee arthroplasty 01/18/2024.  She is overall doing much better since the injection.  She been released from therapy.  She is working on a home exercise program of her own.  She feels her range of motion is greatly improved.  She still notes some swelling about the knee.  Physical exam: Right knee full extension with flexion to 110 degrees.  Left knee 115 degrees.  Right knee surgical incision is well-healed.  No abnormal warmth erythema or effusion right calf supple nontender.  Impression: Status post right total knee arthroplasty 01/18/2024  Plan: Will see her back at her 58-month follow-up and obtain an AP and lateral view of her right knee.  She will continue to work on range of motion and strengthening.  Follow-up sooner if there is any questions concerns.  She is moving early next spring to [place] .  Most likely her next office visit will be her last with us .

## 2024-03-20 ENCOUNTER — Encounter: Payer: Self-pay | Admitting: Bariatrics

## 2024-03-20 ENCOUNTER — Ambulatory Visit (INDEPENDENT_AMBULATORY_CARE_PROVIDER_SITE_OTHER): Admitting: Bariatrics

## 2024-03-20 VITALS — BP 119/75 | HR 83 | Temp 98.0°F | Ht 66.0 in | Wt 248.0 lb

## 2024-03-20 DIAGNOSIS — R7303 Prediabetes: Secondary | ICD-10-CM | POA: Diagnosis not present

## 2024-03-20 DIAGNOSIS — E782 Mixed hyperlipidemia: Secondary | ICD-10-CM

## 2024-03-20 DIAGNOSIS — Z6841 Body Mass Index (BMI) 40.0 and over, adult: Secondary | ICD-10-CM | POA: Diagnosis not present

## 2024-03-20 DIAGNOSIS — E785 Hyperlipidemia, unspecified: Secondary | ICD-10-CM | POA: Diagnosis not present

## 2024-03-20 DIAGNOSIS — E66813 Obesity, class 3: Secondary | ICD-10-CM

## 2024-03-20 MED ORDER — METFORMIN HCL 500 MG PO TABS: ORAL_TABLET | ORAL | 0 refills | Status: DC

## 2024-03-20 NOTE — Progress Notes (Signed)
 WEIGHT SUMMARY AND BIOMETRICS  Weight Lost Since Last Visit: 1lb  Weight Gained Since Last Visit: 0   Vitals Temp: 98 F (36.7 C) BP: 119/75 Pulse Rate: 83 SpO2: 97 %   Anthropometric Measurements Height: 5' 6 (1.676 m) Weight: 248 lb (112.5 kg) BMI (Calculated): 40.05 Weight at Last Visit: 249lb Weight Lost Since Last Visit: 1lb Weight Gained Since Last Visit: 0 Starting Weight: 275lb Total Weight Loss (lbs): 31 lb (14.1 kg)   Body Composition  Body Fat %: 51.6 % Fat Mass (lbs): 128.2 lbs Muscle Mass (lbs): 114.2 lbs Total Body Water  (lbs): 92.6 lbs Visceral Fat Rating : 17   Other Clinical Data Fasting: no Labs: no Today's Visit #: 21 Starting Date: 07/28/21    OBESITY Shyler is here to discuss her progress with her obesity treatment plan along with follow-up of her obesity related diagnoses.    Nutrition Plan: the Category 4 plan - 70% adherence.  Current exercise: Silver sneakers  Interim History:  She is down 1 lb since her last visit.  Eating all of the food on the plan., Protein intake is as prescribed, Is skipping meals, and Water  intake is adequate.   Pharmacotherapy: Addalie is on Metformin  500 mg (2 in the am and 2 in the pm).  Adverse side effects: None Hunger is moderately controlled.  Cravings are moderately controlled.  Assessment/Plan:   Hyperlipidemia LDL is not at goal. Medication(s): Metformin  500 mg 2 tablets BID.  Cardiovascular risk factors: advanced age (older than 16 for men, 69 for women), diabetes mellitus, dyslipidemia, obesity (BMI >= 30 kg/m2), and sedentary lifestyle  Lab Results  Component Value Date   CHOL 225 (H) 12/27/2023   HDL 64 12/27/2023   LDLCALC 130 (H) 12/27/2023   LDLDIRECT 117.0 02/26/2019   TRIG 177 (H) 12/27/2023   CHOLHDL 3 06/28/2020   Lab Results  Component Value Date   ALT 25  12/27/2023   AST 21 12/27/2023   ALKPHOS 74 12/27/2023   BILITOT 0.4 12/27/2023   The 10-year ASCVD risk score (Arnett DK, et al., 2019) is: 9.9%   Values used to calculate the score:     Age: 68 years     Clincally relevant sex: Female     Is Non-Hispanic African American: No     Diabetic: No     Tobacco smoker: No     Systolic Blood Pressure: 119 mmHg     Is BP treated: Yes     HDL Cholesterol: 64 mg/dL     Total Cholesterol: 225 mg/dL  Plan:  Continue statin.  Information sheet on healthy vs unhealthy fats.  Will avoid all trans fats.  Will read labels Will minimize saturated fats except the following: low fat meats in moderation, diary, and limited dark chocolate.  Increase Omega 3 in foods, and consider an Omega 3 supplement.  Prediabetes Last  A1c was 5.7  Medication(s): Metformin  500 mg ( 2 tablets in the am, and 2 tablets in the pm).  Lab Results  Component Value Date   HGBA1C 5.7 (H) 12/27/2023   HGBA1C 5.6 06/19/2023   HGBA1C 5.9 02/21/2023   HGBA1C 5.7 (A) 10/03/2022   HGBA1C 5.7 (H) 07/06/2022   Lab Results  Component Value Date   INSULIN  15.5 12/27/2023   INSULIN  18.0 07/06/2022   INSULIN  25.6 (H) 07/28/2021   INSULIN  21.8 09/20/2016    Plan: Will minimize all refined carbohydrates both sweets and starches.  Will work on the plan and exercise.  Consider both aerobic and resistance training.  Will keep protein, water , and fiber intake high.  Increase Polyunsaturated and Monounsaturated fats to increase satiety and encourage weight loss.  Aim for 7 to 9 hours of sleep nightly.  Continue and refill Metformin  500 mg (2 in the am, and 2 in the pm).  Will cook more at home.      Morbid Obesity: Current BMI BMI (Calculated): 40.05   Pharmacotherapy Plan Continue and refill  Metformin  500 mg ( 2 tablets in the am, and 2 tablets in the pm).   Anaka is currently in the action stage of change. As such, her goal is to continue with weight loss efforts.   She has agreed to the Category 4 plan.  Exercise goals: Older adults should follow the adult guidelines. When older adults cannot meet the adult guidelines, they should be as physically active as their abilities and conditions will allow.   Behavioral modification strategies: increasing lean protein intake, decreasing simple carbohydrates , no meal skipping, increase water  intake, better snacking choices, planning for success, increasing vegetables, avoiding temptations, keep healthy foods in the home, weigh protein portions, measure portion sizes, and mindful eating.  Rashell has agreed to follow-up with our clinic in 2 months.      Objective:   VITALS: Per patient if applicable, see vitals. GENERAL: Alert and in no acute distress. CARDIOPULMONARY: No increased WOB. Speaking in clear sentences.  PSYCH: Pleasant and cooperative. Speech normal rate and rhythm. Affect is appropriate. Insight and judgement are appropriate. Attention is focused, linear, and appropriate.  NEURO: Oriented as arrived to appointment on time with no prompting.   Attestation Statements:   This was prepared with the assistance of Engineer, Civil (consulting).  Occasional wrong-word or sound-a-like substitutions may have occurred due to the inherent limitations of voice recognition   Clayborne Daring, DO

## 2024-03-24 ENCOUNTER — Encounter: Payer: Self-pay | Admitting: Radiology

## 2024-03-30 ENCOUNTER — Encounter: Payer: Self-pay | Admitting: Family

## 2024-03-31 MED ORDER — GABAPENTIN 300 MG PO CAPS: 300.0000 mg | ORAL_CAPSULE | Freq: Two times a day (BID) | ORAL | 1 refills | Status: AC

## 2024-04-01 ENCOUNTER — Ambulatory Visit (INDEPENDENT_AMBULATORY_CARE_PROVIDER_SITE_OTHER): Payer: Medicare Other | Admitting: Internal Medicine

## 2024-04-01 ENCOUNTER — Other Ambulatory Visit

## 2024-04-01 ENCOUNTER — Encounter: Payer: Self-pay | Admitting: Internal Medicine

## 2024-04-01 VITALS — BP 132/82 | HR 83 | Resp 16 | Ht 66.0 in | Wt 262.2 lb

## 2024-04-01 DIAGNOSIS — C73 Malignant neoplasm of thyroid gland: Secondary | ICD-10-CM | POA: Diagnosis not present

## 2024-04-01 DIAGNOSIS — E890 Postprocedural hypothyroidism: Secondary | ICD-10-CM | POA: Diagnosis not present

## 2024-04-01 NOTE — Progress Notes (Signed)
 Patient ID: Michelle Kennedy, female   DOB: August 26, 1955, 68 y.o.   MRN: [id number]   HPI  Michelle Kennedy is a 68 y.o.-year-old female, returning for follow-up for h/o papillary thyroid  cancer and postsurgical hypothyroidism. She saw Dr. Elsie Sharps before. Last visit with me 1 year ago.  Interim history: She has no increased urination - implant in back >> incontinence resolved. Before last visit, she was getting R knee gel + steroid injections.  However, since then she had a right TKR 01/18/2024. She had to have a steroid inj. (3-4 weeks ago) 2/2 swelling. Now doing water  aerobics 3x a week and now also uses a stationary bike.  Thyroid  cancer: Reviewed history: Pt. has been dx with ThyCA in 2002, and had total thyroidectomy then [facility]). No RAI tx 2/2 severe iodine  allergy reportedly, no postop U/S's checked.  Neck U/S (12/2016): Complete thyroidectomy without significant abnormality by ultrasound  Neck U/S (02/03/2022): Surgical changes of total thyroidectomy without evidence of residual or recurrent thyroid  tissue, nodularity or lymphadenopathy.    Thyroglobulin levels are low, but detectable:  Lab Results  Component Value Date   THYROGLB 0.1 (L) 04/05/2023   THYROGLB 0.1 (L) 10/03/2022   THYROGLB 0.1 (L) 02/02/2022   THYROGLB 0.1 (L) 03/15/2021   THYROGLB 0.1 (L) 12/09/2019   THYROGLB 0.3 (L) 07/15/2019   THYROGLB 0.1 (L) 09/17/2017   THYROGLB 0.1 (L) 12/12/2016   Lab Results  Component Value Date   THGAB <1 04/05/2023   THGAB <1 10/03/2022   THGAB <1 02/02/2022   THGAB <1 03/15/2021   THGAB <1 12/09/2019   THGAB <1 07/15/2019   THGAB <1 09/17/2017   THGAB <1 12/12/2016  10/11/2015: Tg <0.1, ATA <1  Pt denies: - feeling nodules in neck - hoarseness - dysphagia - choking  Postsurgical hypothyroidism: She was previously on levothyroxine  200 mcg daily >> decreased to 175 mcg daily >> in 02/2021, I advised her to decrease the dose to 150 mcg daily, but  she forgot.  I advised her to decrease the dose in 02/2021. At last visit, in 01/2022, she was still on the 200 mcg LT4 >> decreased the dose to 150 mcg.  She takes levothyroxine  150 mcg daily:  - in am - fasting - at least 30 min from b'fast -+ Calcium  at night - 1-2 Tums at bedtime 1x a week - + MVI at night - no PPIs  - not on Biotin - on vitamin D   + steroid inj  3-4 weeks ago.  Reviewed patient's TFTs: Lab Results  Component Value Date   TSH 2.330 12/27/2023   TSH 3.99 10/10/2023   TSH 1.23 02/21/2023   TSH 1.12 10/03/2022   TSH 0.05 (L) 02/02/2022   TSH 0.36 07/20/2021   TSH 0.31 (L) 03/15/2021   TSH 0.65 03/01/2020   TSH 0.41 12/09/2019   TSH 0.20 (L) 07/15/2019   FREET4 1.64 12/27/2023   FREET4 1.25 10/03/2022   FREET4 1.35 02/02/2022   FREET4 1.13 07/20/2021   FREET4 1.18 03/15/2021   FREET4 1.13 12/09/2019   FREET4 1.14 07/15/2019   FREET4 1.12 09/17/2017   FREET4 0.87 03/20/2017   FREET4 0.90 12/12/2016   Prev. Very suppressed TSH levels per review of Care Everywhere records.  She has + FH of thyroid  disorders in: siblings: goiter. + FH of thyroid  cancer: Paternal aunts and uncle. No history of radiation therapy to head or neck . Had RxTx for BrCA (diagnosed in 2016)-but not to the neck.  She also has a history of prediabetes-managed by PCP.  Reviewed HbA1c levels: Lab Results  Component Value Date   HGBA1C 5.7 (H) 12/27/2023   HGBA1C 5.6 06/19/2023   HGBA1C 5.9 02/21/2023   HGBA1C 5.7 (A) 10/03/2022   HGBA1C 5.7 (H) 07/06/2022   HGBA1C 5.7 (A) 02/02/2022   HGBA1C 5.6 10/28/2021   HGBA1C 5.8 (H) 07/28/2021   HGBA1C 6.0 (A) 04/22/2021   HGBA1C 6.2 10/26/2020   On Metformin  500 mg 2x a day >> lightheadedness >> once a day >> stopped >> restarted. She was on Ozempic  2 mg weekly - stopped 05/2023 2/2 insurance lack of coverage. She is seen in the weight management clinic.  She has fibromyalgia, RA, HTN, asthma.  She is on anastrozole  for h/o breast  cancer.  ROS: + See HPI  I reviewed pt's medications, allergies, PMH, social hx, family hx, and changes were documented in the history of present illness. Otherwise, unchanged from my initial visit note.  Past Medical History:  Diagnosis Date   Abnormally small mouth    Achilles tendon disorder, right    Allergy    Anemia    Anxiety    Arthritis    hips and knees   Asthma    daily inhaler, prn inhaler and neb.   Basal cell carcinoma    nose, s/p mohs.   Breast cancer (HCC) 01/2014   left   Cataract    bilateral - surgery   CHF (congestive heart failure) (HCC)    COPD (chronic obstructive pulmonary disease) (HCC)    Dental bridge present    upper front and lower right   Dental crowns present    x 3   Depression    Esophageal spasm    reports since her chemo and breast surgery  she developed esophageal spasms and reports this is in the past has casue her 02 to desat in the  70s , denies sycnope in relation to this , does report hx of vertigo as well    Family history of anesthesia complication    twin brother aspirated and died on OR table, per pt.   Fibromyalgia    Gallbladder disease    Gallstones    GERD (gastroesophageal reflux disease)    Glaucoma    H/O blood clots    had blood to  PICC line    History of gastric ulcer    as a teenager   History of seizure age 63   as a reaction to Penicillin - no seizures since   History of thyroid  cancer    s/p thyroidectomy   HTN (hypertension)    Hyperlipidemia    Hypothyroidism    Joint pain    Liver disorder    seen when she had her hysterectomy , reports she was told it was   small lesion ; but asymptomatic    Lymphedema    left arm   Migraines    Multiple food allergies    Obesity    OSA (obstructive sleep apnea) 02/16/2016   CPAP- uses nightly   Palpitations    reports no longer experiences   Personal history of chemotherapy 2015   Personal history of radiation therapy 2015   Left   Pneumonia    x 1  years ago   Pre-diabetes    Rheumatoid arthritis (HCC)    SOB (shortness of breath)    Swelling of both ankles    Tinnitus    UTI (lower urinary tract infection) 02/17/2014  Vertigo    Vitamin D  deficiency    Wears hearing aid in both ears    hearing loss   Past Surgical History:  Procedure Laterality Date   ABDOMINAL HYSTERECTOMY  2004   complete   ACHILLES TENDON REPAIR Right    APPENDECTOMY  2004   BACK SURGERY  02/09/2023   at the [facility] [place]   bilateral eye surgery Bilateral 2022   glaucoma   bladder stimulator  08/29/2022   Medtronic bladder stinulator - with remote   BREAST EXCISIONAL BIOPSY     BREAST LUMPECTOMY Left    2015   BREAST LUMPECTOMY WITH NEEDLE LOCALIZATION AND AXILLARY SENTINEL LYMPH NODE BX Left 02/23/2014   Procedure: BREAST LUMPECTOMY WITH NEEDLE LOCALIZATION AND AXILLARY SENTINEL LYMPH NODE BIOPSY;  Surgeon: Morene Olives, MD;  Location: [facility];  Service: General;  Laterality: Left;   BREAST SURGERY  2011   left breast biposy   CARPAL TUNNEL RELEASE Bilateral    CHOLECYSTECTOMY  1990   COLONOSCOPY W/ POLYPECTOMY  06/2009   DECOMPRESSIVE LUMBAR LAMINECTOMY LEVEL 1 N/A 06/22/2023   Procedure: LUMBAR FOUR/FIVE LAMINECTOMY WITH FACET CYST EXCISION;  Surgeon: Georgina Ozell LABOR, MD;  Location: [facility];  Service: Orthopedics;  Laterality: N/A;   EYE SURGERY Right 2013   exc. warts from underneath eyelid   INCONTINENCE SURGERY  2004   KNEE ARTHROSCOPY Bilateral    x 6 each knee   LIGAMENT REPAIR Right    thumb/wrist   ORIF TOE FRACTURE Right    great toe   PORTACATH PLACEMENT N/A 03/24/2014   Procedure: INSERTION PORT-A-CATH;  Surgeon: Morene Olives, MD;  Location: [facility];  Service: General;  Laterality: N/A;; removed    THYROIDECTOMY  2000   TONSILLECTOMY AND ADENOIDECTOMY  2000   TOTAL KNEE ARTHROPLASTY Left 12/03/2017   Procedure: LEFT TOTAL KNEE ARTHROPLASTY;  Surgeon: Melodi Lerner, MD;   Location: [facility];  Service: Orthopedics;  Laterality: Left;   TOTAL KNEE ARTHROPLASTY Right 01/18/2024   Procedure: ARTHROPLASTY, KNEE, TOTAL;  Surgeon: Vernetta Lonni GRADE, MD;  Location: [facility];  Service: Orthopedics;  Laterality: Right;   TUBAL LIGATION  1981   TUMOR EXCISION     from thoracic spine   Social History   Social History   Marital status: Married    Spouse name: N/A   Number of children: 2   Occupational History   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 75 mL 5   albuterol  (VENTOLIN  HFA) 108 (90 Base) MCG/ACT inhaler Inhale 2 puffs into the lungs every 6 (six) hours as needed for wheezing or shortness of breath.     amLODipine  (NORVASC ) 5 MG tablet Take 1 tablet (5 mg total) by mouth daily. 90 tablet 0   CALCIUM  PO Take 1 tablet by mouth every evening.     cholecalciferol  (VITAMIN D3) 25 MCG (1000 UNIT) tablet Take 1,000 Units by mouth every evening.      clopidogrel  (PLAVIX ) 75 MG tablet TAKE 1 TABLET BY MOUTH DAILY 90  tablet 3   EPINEPHrine  0.3 mg/0.3 mL IJ SOAJ injection Inject 0.3 mg into the muscle as needed for anaphylaxis. 2 each 1   fluticasone -salmeterol (WIXELA INHUB) 500-50 MCG/ACT AEPB Inhale 1 puff into the lungs in the morning and at bedtime. 60 each 2   furosemide  (LASIX ) 20 MG tablet TAKE 1 TABLET BY MOUTH DAILY AS  NEEDED FOR SWELLING (Patient taking differently: TAKE 1 TABLET BY MOUTH DAILY AS  NEEDED FOR SWELLING) 90 tablet 3   gabapentin  (NEURONTIN ) 300 MG capsule Take 1 capsule (300 mg total) by mouth 2 (two) times daily. 180  capsule 1   ipratropium-albuterol  (DUONEB) 0.5-2.5 (3) MG/3ML SOLN Take 3 mLs by nebulization every 6 (six) hours. 360 mL 0   latanoprost  (XALATAN ) 0.005 % ophthalmic solution Place 1 drop into both eyes at bedtime.     levothyroxine  (SYNTHROID ) 150 MCG tablet Take 1 tablet (150 mcg total) by mouth daily. 90 tablet 3   lisinopril  (ZESTRIL ) 20 MG tablet TAKE 2 TABLETS BY MOUTH DAILY 180 tablet 3   meclizine  (ANTIVERT ) 25 MG tablet Take 1 tablet (25 mg total) by mouth 3 (three) times daily as needed for dizziness. 30 tablet 0   metFORMIN  (GLUCOPHAGE ) 500 MG tablet TAKE 2 TABLETS BY MOUTH IN THE MORNING AND 2 TABLETS IN THE EVENING 120 tablet 0   montelukast  (SINGULAIR ) 10 MG tablet TAKE 1 TABLET BY MOUTH AT  BEDTIME (Patient taking differently: Take 10 mg by mouth in the morning.) 90 tablet 3   Multiple Vitamin (MULTIVITAMIN ADULT) TABS Take 1 tablet by mouth daily.     NON FORMULARY Pt uses a c-pap nightly     Omega-3 Fatty Acids (FISH OIL) 1000 MG CAPS Take 1,000 mg by mouth every evening.     ondansetron  (ZOFRAN -ODT) 4 MG disintegrating tablet Take 1 tablet (4 mg total) by mouth every 8 (eight) hours as needed. 20 tablet 0   oxyCODONE  (OXY IR/ROXICODONE ) 5 MG immediate release tablet Take 1 tablet (5 mg total) by mouth every 6 (six) hours as needed for severe pain (pain score 7-10). 30 tablet 0   pantoprazole  (PROTONIX ) 40 MG tablet Take 1 tablet (40 mg total) by mouth daily. 90 tablet 3   rosuvastatin  (CRESTOR ) 5 MG tablet TAKE 1 TABLET BY MOUTH DAILY 90 tablet 3   tiZANidine  (ZANAFLEX ) 2 MG tablet Take 1 tablet (2 mg total) by mouth every 6 (six) hours as needed for muscle spasms. 30 tablet 0   No current facility-administered medications on file prior to visit.   Allergies  Allergen Reactions   Bee Venom Anaphylaxis   Betadine  [Povidone-Iodine ] Shortness Of Breath and Rash    Patient reports during pre-op call that she is severely allergic to Betadine  skin prep   Codeine Shortness Of  Breath and Rash   Contrast Media [Iodinated Contrast Media] Shortness Of Breath   Gadolinium Derivatives Hives, Rash, Shortness Of Breath and Swelling    my heart stopped beating    Latex Anaphylaxis and Rash   Penicillins Shortness Of Breath, Rash and Other (See Comments)    SEIZURE   Pentazocine Lactate Shortness Of Breath and Rash   Shellfish Allergy Shortness Of Breath and Rash   Aspirin Rash and Other (See Comments)    GI UPSET   Erythromycin Swelling and Rash    SWELLING OF JOINTS   Symbicort  [Budesonide -Formoterol  Fumarate] Other (See Comments)    BURNING OF TONGUE AND LIPS   Lidocaine      Low blood pressure during back  injection, patient denies respiratory symptoms, rash, airway compromise, or swelling. Used IV and for skin infiltration on 01/18/24 uneventfully.   Compazine  [Prochlorperazine  Maleate] Rash    Rash on face,chest, arms, back   Sulfonamide Derivatives Rash   Family History  Problem Relation Age of Onset   Alcohol abuse Mother    Arthritis Mother    Hypertension Mother    Bipolar disorder Mother    Breast cancer Mother 46       unconfirmed   Lung cancer Mother 42       smoker   Hyperlipidemia Mother    Stroke Mother    Cancer Mother    Depression Mother    Anxiety disorder Mother    Alcoholism Mother    Drug abuse Mother    Eating disorder Mother    Obesity Mother    Alcohol abuse Father    Hyperlipidemia Father    Kidney disease Father    Diabetes Father    Hypertension Father    Cystic kidney disease Father    Thyroid  disease Father    Liver disease Father    Alcoholism Father    Lung cancer Father    Thyroid  cancer Sister 8       type?; currently 31   Other Sister        ovarian tumor @ 48; TAH/BSO   Breast cancer Sister    Thyroid  cancer Brother        dx 30s; currently 80   Breast cancer Maternal Aunt        dx 33s; deceased 77   Thyroid  cancer Paternal Aunt        All 3 paternal aunts with thyroid  ca in 30s/40s   Lung cancer  Paternal Aunt        2 of 3 paternal aunts with lung cancer   Arthritis Maternal Grandmother    Diabetes Paternal Grandmother    Heart disease Other    COPD Other    Asthma Other    PE: BP 132/82   Pulse 83   Resp 16   Ht 5' 6 (1.676 m)   Wt 262 lb 3.2 oz (118.9 kg)   SpO2 98%   BMI 42.32 kg/m  Wt Readings from Last 20 Encounters:  04/01/24 262 lb 3.2 oz (118.9 kg)  03/20/24 248 lb (112.5 kg)  02/07/24 249 lb (112.9 kg)  01/18/24 253 lb 15.5 oz (115.2 kg)  01/15/24 254 lb (115.2 kg)  12/27/23 254 lb (115.2 kg)  12/26/23 262 lb 6.4 oz (119 kg)  12/10/23 254 lb (115.2 kg)  11/22/23 259 lb 6.4 oz (117.7 kg)  11/19/23 261 lb (118.4 kg)  11/15/23 252 lb (114.3 kg)  10/10/23 256 lb (116.1 kg)  10/05/23 256 lb 3.2 oz (116.2 kg)  10/04/23 250 lb (113.4 kg)  09/27/23 264 lb 1.6 oz (119.8 kg)  08/13/23 257 lb 6.4 oz (116.8 kg)  07/19/23 247 lb (112 kg)  06/22/23 253 lb (114.8 kg)  06/21/23 253 lb (114.8 kg)  06/19/23 255 lb 14.4 oz (116.1 kg)   Constitutional: overweight, in NAD Eyes:  EOMI, no exophthalmos ENT: no neck masses, no cervical lymphadenopathy Cardiovascular: RRR, No MRG, + B pitting LE edema Respiratory: CTA B Musculoskeletal: no deformities Skin:no rashes Neurological: + slight tremor with outstretched L hand  ASSESSMENT: 1. H/O PTC  2. Hypothyroidism  3.  Prediabetes - R eye stroke 2021 - Managed by PCP  She also has dCHF but unlikely as a complication of her  prediabetes.  PLAN:  1. PTC -No detailed records, but reportedly she did not have RAI treatment due to allergy to iodine  - Previous thyroglobulin levels were undetectable as were her ATA antibodies per records reviewed from Dr. Claudene.  Thyroglobulin increased afterwards, with a level of 0.3 in 06/2019 and we discussed that this was not completely uncommon, thyroglobulin in the absence of RAI treatment can fluctuate.  More recent thyroglobulin levels remain barely detectable, at 0.1, including  at last check in 03/2023. - Neck ultrasound from 2018 and 2023 showed no metastasis or recurrence is in the neck - Will recheck her thyroglobulin and ATA antibodies now - Depending on the results, we may need to check another neck ultrasound - Otherwise, I plan to see her back in a year   2. Patient with longstanding, postsurgical hypothyroidism, on levothyroxine  therapy - latest thyroid  labs reviewed with pt. >> normal: Lab Results  Component Value Date   TSH 2.330 12/27/2023  - she continues on LT4 150 mcg daily - pt feels good on this dose.  She did gain 7 pounds since last visit, however, possibly also after stopping Ozempic  due to lack of coverage. - we discussed about taking the thyroid  hormone every day, with water , >30 minutes before breakfast, separated by >4 hours from acid reflux medications, calcium , iron, multivitamins. Pt. is taking it correctly. - will check her TSH today - If labs are abnormal, she will need to return for repeat TFTs in 1.5 months  Orders Placed This Encounter  Procedures   TSH   Thyroglobulin antibody   Thyroglobulin Level   Lela Fendt, MD PhD [facility]

## 2024-04-01 NOTE — Patient Instructions (Signed)
Please continue Levothyroxine 150 mcg daily.  Take the thyroid hormone every day, with water, at least 30 minutes before breakfast, separated by at least 4 hours from: - acid reflux medications - calcium - iron - multivitamins  Please stop at the lab.  Please return in 1 year.

## 2024-04-03 LAB — TSH: TSH: 2.21 m[IU]/L (ref 0.40–4.50)

## 2024-04-03 LAB — THYROGLOBULIN LEVEL: Thyroglobulin: 0.1 ng/mL — ABNORMAL LOW

## 2024-04-03 LAB — THYROGLOBULIN ANTIBODY: Thyroglobulin Ab: <1 [IU]/mL (ref <=1)

## 2024-04-04 ENCOUNTER — Ambulatory Visit: Payer: Self-pay | Admitting: Internal Medicine

## 2024-04-04 MED ORDER — LEVOTHYROXINE SODIUM 150 MCG PO TABS: 150.0000 ug | ORAL_TABLET | Freq: Every day | ORAL | 3 refills | Status: AC

## 2024-04-04 NOTE — Addendum Note (Signed)
 Addended by: TRIXIE FILE on: 04/04/2024 12:18 PM   Modules accepted: Orders

## 2024-04-19 ENCOUNTER — Other Ambulatory Visit: Payer: Self-pay | Admitting: Family

## 2024-05-12 IMAGING — CR DG ANKLE COMPLETE 3+V*L*
3 series · 3 of 3 positions shown · non-contrast
Comparison: None Available.

CLINICAL DATA: Pain, previous trauma

EXAM:
LEFT ANKLE COMPLETE - 3+ VIEW

[t ankle joint ap left]
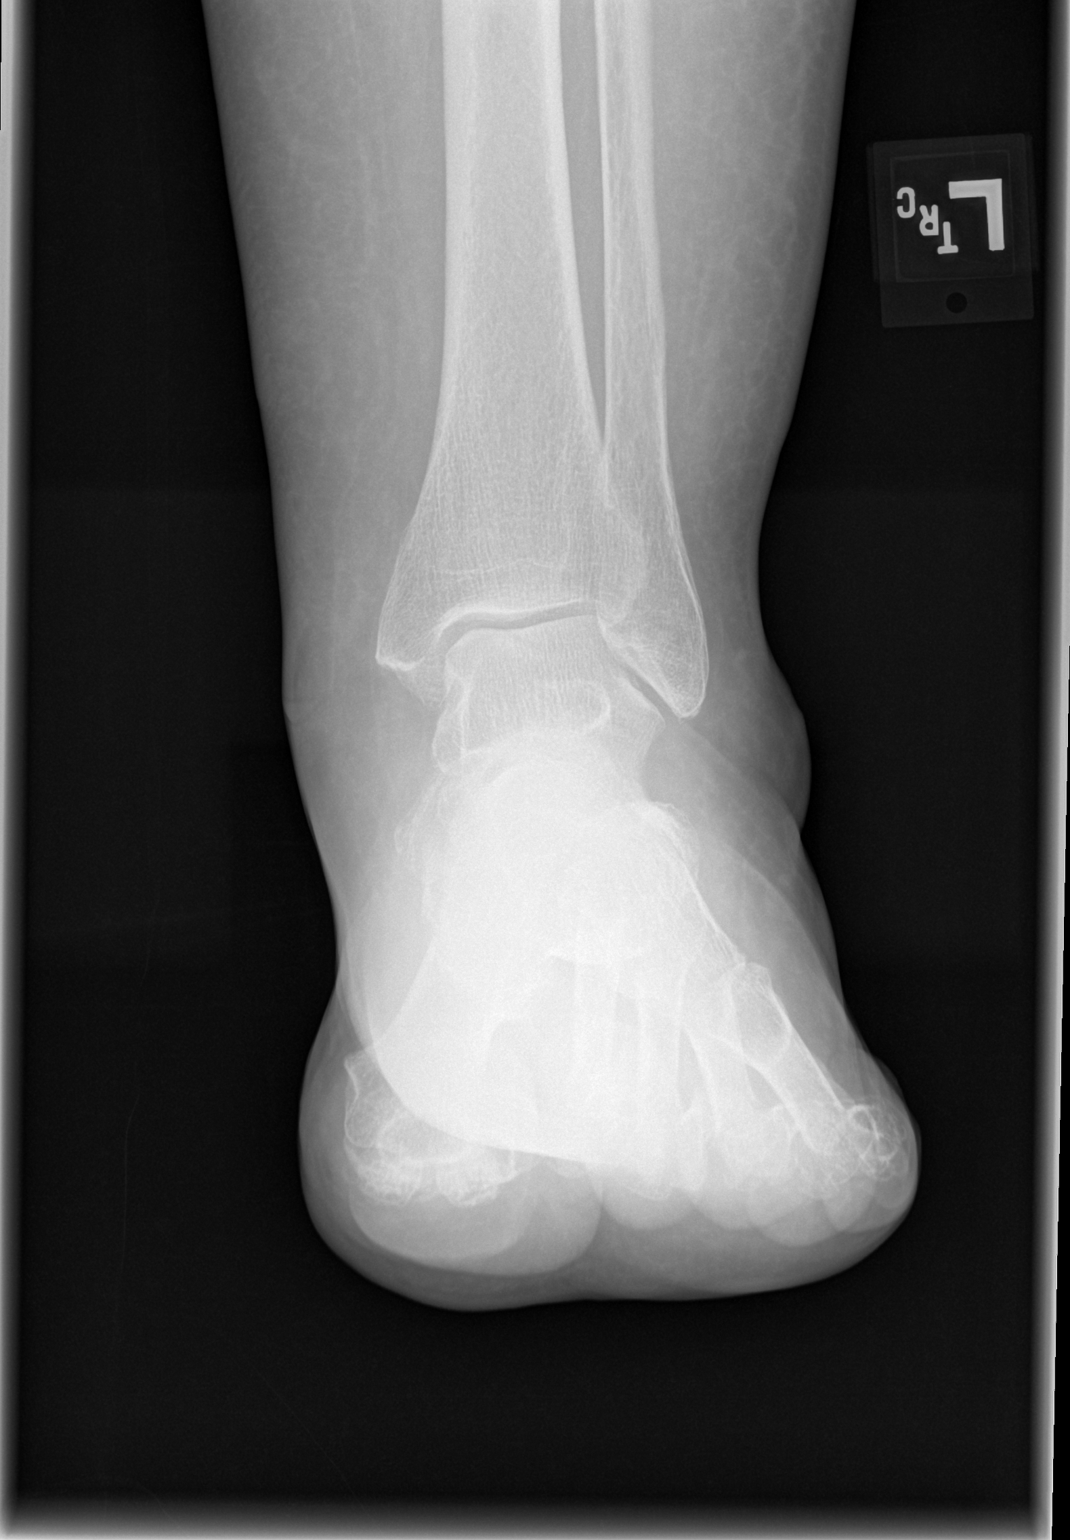

[t ankle joint oblique left]
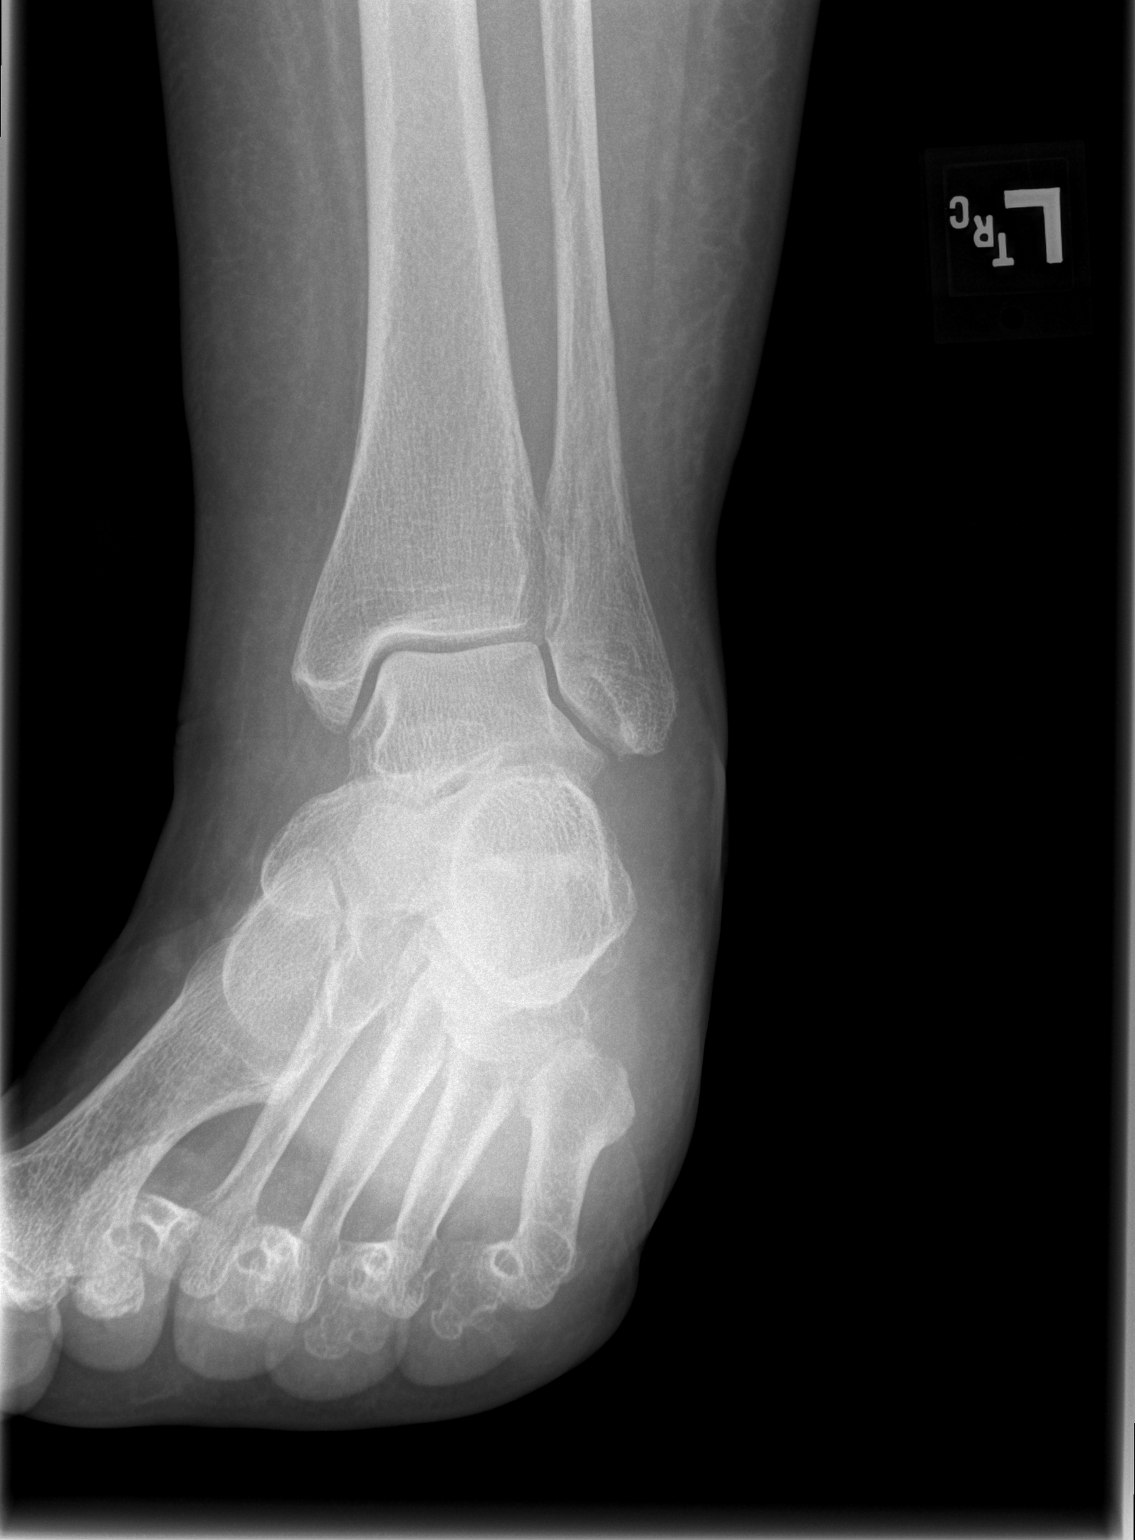

[t ankle joint lat left]
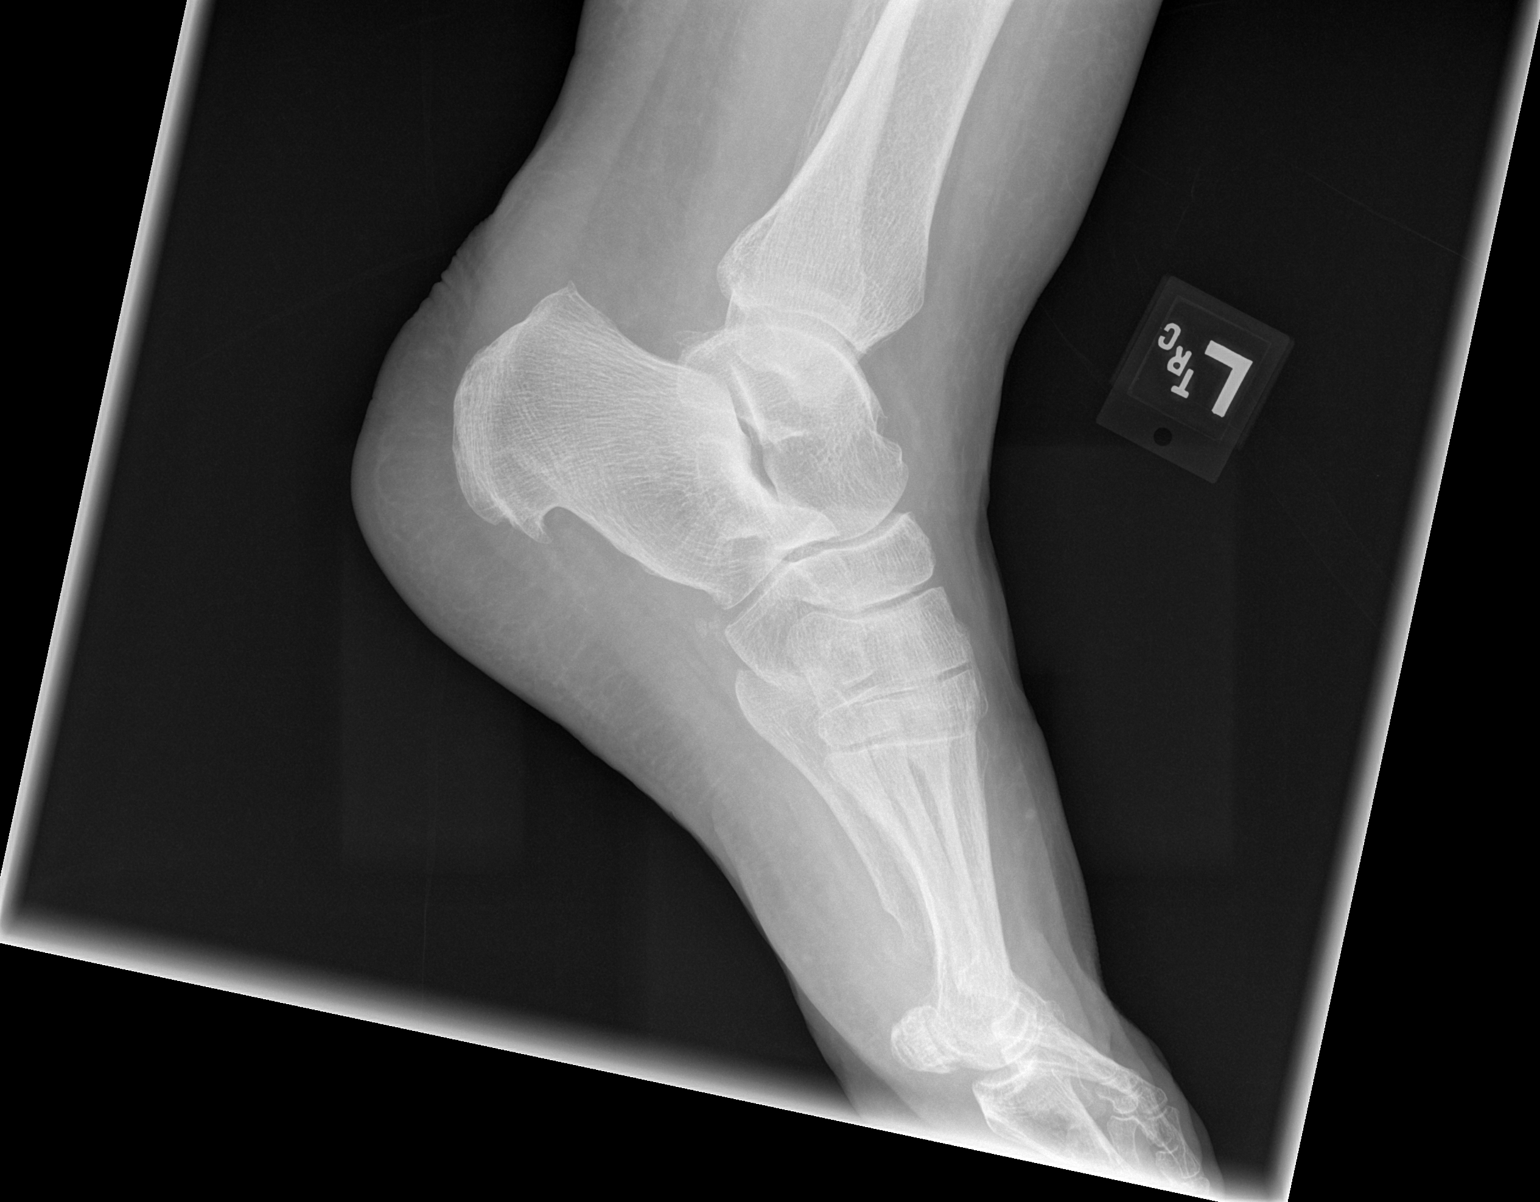

[3 of 3 positions shown; findings below may reference images not displayed]

FINDINGS: No fracture or dislocation is seen. Small plantar spur is seen in
the calcaneus. There is soft tissue swelling around the ankle, more
so over the medial aspect.
IMPRESSION: No fracture or dislocation is seen in the left ankle. Plantar spur
is seen in the calcaneus.

## 2024-05-23 ENCOUNTER — Ambulatory Visit: Payer: Self-pay | Admitting: Pulmonary Disease

## 2024-05-23 NOTE — Telephone Encounter (Signed)
 FYI Only or Action Required?: FYI only for provider: ED advised.  Patient is followed in Pulmonology for OSA, last seen on 09/11/2022 by Neda Jennet LABOR, MD.  Called Nurse Triage reporting Shortness of Breath.  Symptoms began several weeks ago.  Interventions attempted: Rescue inhaler, Maintenance inhaler, and Nebulizer treatments.  Symptoms are: gradually worsening.  Triage Disposition: No disposition on file.  Patient/caregiver understands and will follow disposition?:   E2C2 Pulmonary Triage - Initial Assessment Questions Chief Complaint (e.g., cough, sob, wheezing, fever, chills, sweat or additional symptoms) *Go to specific symptom protocol after initial questions. Patient reports returning from a 3 week trip in the [place] . Reports she developed a cold visiting family in [place]  three weeks ago. Patient reports shortness of breath, wheezing and coughing. Denies fever, chills or sweating. Reports oxygen levels are dropping to mid to high 80s-this is happening with activity and at rest. Reports good use of nebulizer and inhalers. Current pulse oximeter level at 89%-not on oxygen. Recommended patient to call 911 for EMS to come evaluate patient and to go to the ED. Patient verbalized understanding and states she will do that. Patient would like to be seen in the office.   How long have symptoms been present? Three weeks ago  Have you tested for COVID or Flu? Note: If not, ask patient if a home test can be taken. If so, instruct patient to call back for positive results. No  MEDICINES:   Have you used any OTC meds to help with symptoms? Yes If yes, ask What medications? Mucinex  Have you used your inhalers/maintenance medication? Yes If yes, What medications? Albuterol  inhaler/nebulizer Wixela   If inhaler, ask How many puffs and how often? Note: Review instructions on medication in the chart. Wixela 1 puff BID  OXYGEN: Do you wear supplemental oxygen?  No If yes, How many liters are you supposed to use?   Do you monitor your oxygen levels? Yes If yes, What is your reading (oxygen level) today? 89% on RM  What is your usual oxygen saturation reading?  (Note: Pulmonary O2 sats should be 90% or greater) 92-96%   Copied from CRM #[id number]. Topic: Clinical - Red Word Triage >> May 23, 2024 10:26 AM Ismael A wrote: Red Word that prompted transfer to Nurse Triage: pt has been wheezing for the past 3 weeks after she got sick, O2 levels dropping to mid to high 80s, coughing when she talks, SOB Reason for Disposition  [1] MODERATE difficulty breathing (e.g., speaks in phrases, SOB even at rest, pulse 100-120) AND [2] NEW-onset or WORSE than normal  Answer Assessment - Initial Assessment Questions 1. RESPIRATORY STATUS: Describe your breathing? (e.g., wheezing, shortness of breath, unable to speak, severe coughing)      Shortness of breath 2. ONSET: When did this breathing problem begin?      A couple of weeks ago 3. PATTERN Does the difficult breathing come and go, or has it been constant since it started?      constant 4. SEVERITY: How bad is your breathing? (e.g., mild, moderate, severe)      moderate 5. RECURRENT SYMPTOM: Have you had difficulty breathing before? If Yes, ask: When was the last time? and What happened that time?      yes 6. CARDIAC HISTORY: Do you have any history of heart disease? (e.g., heart attack, angina, bypass surgery, angioplasty)      yes 7. LUNG HISTORY: Do you have any history of lung disease?  (e.g., pulmonary embolus,  asthma, emphysema)     yes 8. CAUSE: What do you think is causing the breathing problem?      asthma 9. OTHER SYMPTOMS: Do you have any other symptoms? (e.g., chest pain, cough, dizziness, fever, runny nose)     NA 10. O2 SATURATION MONITOR:  Do you use an oxygen saturation monitor (pulse oximeter) at home? If Yes, ask: What is your reading (oxygen level)  today? What is your usual oxygen saturation reading? (e.g., 95%)       89% 12. TRAVEL: Have you traveled out of the country in the last month? (e.g., travel history, exposures)       no  Protocols used: Breathing Difficulty-A-AH

## 2024-05-26 ENCOUNTER — Ambulatory Visit (INDEPENDENT_AMBULATORY_CARE_PROVIDER_SITE_OTHER): Admitting: *Deleted

## 2024-05-26 ENCOUNTER — Telehealth: Payer: Self-pay | Admitting: *Deleted

## 2024-05-26 VITALS — BP 125/62 | HR 72 | Temp 97.7°F | Resp 18 | Ht 66.0 in | Wt 263.2 lb

## 2024-05-26 DIAGNOSIS — Z0000 Encounter for general adult medical examination without abnormal findings: Secondary | ICD-10-CM

## 2024-05-26 NOTE — Patient Instructions (Addendum)
 Ms. Grahn,  Thank you for taking the time for your Medicare Wellness Visit. I appreciate your continued commitment to your health goals. Please review the care plan we discussed, and feel free to reach out if I can assist you further.  Please note that Annual Wellness Visits do not include a physical exam. Some assessments may be limited, especially if the visit was conducted virtually. If needed, we may recommend an in-person follow-up with your provider.  Goal:  Ongoing Care Seeing your primary care provider every 3 to 6 months helps us  monitor your health and provide consistent, personalized care.   Eleanor Ponto, NP: call to schedule with your new PCP  Medicare AWV:  call to schedule where you relocate  Referrals If a referral was made during today's visit and you haven't received any updates within two weeks, please contact the referred provider directly to check on the status.  [facility] ([facility]): [phone] Bone Density ([facility]): [phone]  Recommended Screenings:  Health Maintenance  Topic Date Due   Medicare Annual Wellness Visit  08/24/2023   Breast Cancer Screening  01/24/2024   COVID-19 Vaccine (9 - Pfizer risk 2025-26 season) 09/25/2024   Cologuard (Stool DNA test)  10/26/2026   DTaP/Tdap/Td vaccine (3 - Td or Tdap) 08/12/2029   Pneumococcal Vaccine for age over 67  Completed   Flu Shot  Completed   Osteoporosis screening with Bone Density Scan  Completed   Hepatitis C Screening  Completed   Zoster (Shingles) Vaccine  Completed   Meningitis B Vaccine  Aged Out   Colon Cancer Screening  Discontinued       05/23/2024   11:37 AM  Advanced Directives  Does Patient Have a Medical Advance Directive? No  Would patient like information on creating a medical advance directive? Yes (MAU/Ambulatory/Procedural Areas - Information given)  Once completed and notarized, you may return a copy of your Advanced Directive(s) by either of the  following:  Bring a copy of your health care power of attorney and living will to the office to be added to your chart at your convenience. You can mail a copy to [facility] [address] [place], [place] [address] or email to [email or web address]   Vision: Annual vision screenings are recommended for early detection of glaucoma, cataracts, and diabetic retinopathy. These exams can also reveal signs of chronic conditions such as diabetes and high blood pressure.  Dental: Annual dental screenings help detect early signs of oral cancer, gum disease, and other conditions linked to overall health, including heart disease and diabetes.  Please see the attached documents for additional preventive care recommendations.

## 2024-05-26 NOTE — Telephone Encounter (Signed)
 FYI: Pt had AWV today.  [place]/grandchildren [place]. She did not schedule follow up with PCP and feels she has enough medications to get her through her move.

## 2024-05-26 NOTE — Progress Notes (Signed)
 "  Please attest this visit in the absence of patient primary care provider.    Chief Complaint  Patient presents with   Medicare Wellness     Subjective:   Michelle Kennedy is a 69 y.o. female who presents for a Medicare Annual Wellness Visit.  Visit info / Clinical Intake: Medicare Wellness Visit Type:: Subsequent Annual Wellness Visit Persons participating in visit and providing information:: patient Medicare Wellness Visit Mode:: In-person (required for WTM) Interpreter Needed?: No Pre-visit prep was completed: yes AWV questionnaire completed by patient prior to visit?: yes Date:: 05/23/24 Living arrangements:: (Patient-Rptd) lives with spouse/significant other Patient's Overall Health Status Rating: (Patient-Rptd) good Typical amount of pain: (Patient-Rptd) some Does pain affect daily life?: (Patient-Rptd) no Are you currently prescribed opioids?: no  Dietary Habits and Nutritional Risks How many meals a day?: (Patient-Rptd) 3 Eats fruit and vegetables daily?: (Patient-Rptd) yes Most meals are obtained by: (Patient-Rptd) preparing own meals; eating out In the last 2 weeks, have you had any of the following?: none Diabetic:: no  Functional Status Activities of Daily Living (to include ambulation/medication): (Patient-Rptd) Independent Ambulation: (Patient-Rptd) Independent Medication Administration: (Patient-Rptd) Independent Home Management (perform basic housework or laundry): (Patient-Rptd) Independent Manage your own finances?: (Patient-Rptd) yes Primary transportation is: (Patient-Rptd) driving Concerns about vision?: no *vision screening is required for WTM* Concerns about hearing?: no (has appt next month for re-evaluation) Uses hearing aids?: (!) yes  Fall Screening Falls in the past year?: (Patient-Rptd) 0 Number of falls in past year: 0 Was there an injury with Fall?: 0 Fall Risk Category Calculator: 0 Patient Fall Risk Level: Low Fall Risk  Fall  Risk Patient at Risk for Falls Due to: Orthopedic patient Fall risk Follow up: Falls evaluation completed  Home and Transportation Safety: All rugs have non-skid backing?: (Patient-Rptd) yes All stairs or steps have railings?: (Patient-Rptd) yes Grab bars in the bathtub or shower?: (Patient-Rptd) yes Have non-skid surface in bathtub or shower?: (Patient-Rptd) yes Good home lighting?: (Patient-Rptd) yes Regular seat belt use?: (Patient-Rptd) yes Hospital stays in the last year:: (!) (Patient-Rptd) yes How many hospital stays:: (Patient-Rptd) 3  Cognitive Assessment Difficulty concentrating, remembering, or making decisions? : (Patient-Rptd) no Will 6CIT or Mini Cog be Completed: yes What year is it?: 0 points What month is it?: 0 points Give patient an address phrase to remember (5 components): [address] [place] [employer] About what time is it?: 0 points Count backwards from 20 to 1: 2 points Say the months of the year in reverse: 0 points Repeat the address phrase from earlier: 4 points 6 CIT Score: 6 points  Advance Directives (For Healthcare) Does Patient Have a Medical Advance Directive?: No Does patient want to make changes to medical advance directive?: No - Patient declined Type of Advance Directive: Living will Copy of Living Will in Chart?: No - copy requested Would patient like information on creating a medical advance directive?: Yes (MAU/Ambulatory/Procedural Areas - Information given)  Reviewed/Updated  Reviewed/Updated: Reviewed All (Medical, Surgical, Family, Medications, Allergies, Care Teams, Patient Goals)    Allergies (verified) Bee venom, Betadine  [povidone-iodine ], Codeine, Contrast media [iodinated contrast media], Gadolinium derivatives, Latex, Penicillins, Pentazocine lactate, Shellfish allergy, Aspirin, Erythromycin, Symbicort  [budesonide -formoterol  fumarate], Lidocaine , Compazine  [prochlorperazine  maleate], and Sulfonamide derivatives    Current Medications (verified) Outpatient Encounter Medications as of 05/26/2024  Medication Sig   albuterol  (PROVENTIL ) (2.5 MG/3ML) 0.083% nebulizer solution Take 3 mLs (2.5 mg total) by nebulization every 6 (six) hours as needed for wheezing or shortness of breath.  albuterol  (VENTOLIN  HFA) 108 (90 Base) MCG/ACT inhaler Inhale 2 puffs into the lungs every 6 (six) hours as needed for wheezing or shortness of breath.   amLODipine  (NORVASC ) 5 MG tablet TAKE 1 TABLET BY MOUTH DAILY   CALCIUM  PO Take 1 tablet by mouth every evening.   cholecalciferol  (VITAMIN D3) 25 MCG (1000 UNIT) tablet Take 1,000 Units by mouth every evening.    clopidogrel  (PLAVIX ) 75 MG tablet TAKE 1 TABLET BY MOUTH DAILY   EPINEPHrine  0.3 mg/0.3 mL IJ SOAJ injection Inject 0.3 mg into the muscle as needed for anaphylaxis.   fluticasone -salmeterol (WIXELA INHUB) 500-50 MCG/ACT AEPB Inhale 1 puff into the lungs in the morning and at bedtime.   furosemide  (LASIX ) 20 MG tablet TAKE 1 TABLET BY MOUTH DAILY AS  NEEDED FOR SWELLING (Patient taking differently: TAKE 1 TABLET BY MOUTH DAILY AS  NEEDED FOR SWELLING)   gabapentin  (NEURONTIN ) 300 MG capsule Take 1 capsule (300 mg total) by mouth 2 (two) times daily.   ipratropium-albuterol  (DUONEB) 0.5-2.5 (3) MG/3ML SOLN Take 3 mLs by nebulization every 6 (six) hours.   latanoprost  (XALATAN ) 0.005 % ophthalmic solution Place 1 drop into both eyes at bedtime.   levothyroxine  (SYNTHROID ) 150 MCG tablet Take 1 tablet (150 mcg total) by mouth daily.   lisinopril  (ZESTRIL ) 20 MG tablet TAKE 2 TABLETS BY MOUTH DAILY   meclizine  (ANTIVERT ) 25 MG tablet Take 1 tablet (25 mg total) by mouth 3 (three) times daily as needed for dizziness.   metFORMIN  (GLUCOPHAGE ) 500 MG tablet TAKE 2 TABLETS BY MOUTH IN THE MORNING AND 2 TABLETS IN THE EVENING   montelukast  (SINGULAIR ) 10 MG tablet TAKE 1 TABLET BY MOUTH AT  BEDTIME (Patient taking differently: Take 10 mg by mouth in the morning.)   Multiple  Vitamin (MULTIVITAMIN ADULT) TABS Take 1 tablet by mouth daily.   NON FORMULARY Pt uses a c-pap nightly   Omega-3 Fatty Acids (FISH OIL) 1000 MG CAPS Take 1,000 mg by mouth every evening.   ondansetron  (ZOFRAN -ODT) 4 MG disintegrating tablet Take 1 tablet (4 mg total) by mouth every 8 (eight) hours as needed.   oxyCODONE  (OXY IR/ROXICODONE ) 5 MG immediate release tablet Take 1 tablet (5 mg total) by mouth every 6 (six) hours as needed for severe pain (pain score 7-10).   pantoprazole  (PROTONIX ) 40 MG tablet Take 1 tablet (40 mg total) by mouth daily.   rosuvastatin  (CRESTOR ) 5 MG tablet TAKE 1 TABLET BY MOUTH DAILY   tiZANidine  (ZANAFLEX ) 2 MG tablet Take 1 tablet (2 mg total) by mouth every 6 (six) hours as needed for muscle spasms.   No facility-administered encounter medications on file as of 05/26/2024.    History: Past Medical History:  Diagnosis Date   Abnormally small mouth    Achilles tendon disorder, right    Allergy    Anemia    Anxiety    Arthritis    hips and knees   Asthma    daily inhaler, prn inhaler and neb.   Basal cell carcinoma    nose, s/p mohs.   Breast cancer (HCC) 01/2014   left   Cataract    bilateral - surgery   CHF (congestive heart failure) (HCC)    COPD (chronic obstructive pulmonary disease) (HCC)    Dental bridge present    upper front and lower right   Dental crowns present    x 3   Depression    Esophageal spasm    reports since her chemo and  breast surgery  she developed esophageal spasms and reports this is in the past has casue her 02 to desat in the  70s , denies sycnope in relation to this , does report hx of vertigo as well    Family history of anesthesia complication    twin brother aspirated and died on OR table, per pt.   Fibromyalgia    Gallbladder disease    Gallstones    GERD (gastroesophageal reflux disease)    Glaucoma    H/O blood clots    had blood to  PICC line    History of gastric ulcer    as a teenager   History of  seizure age 27   as a reaction to Penicillin - no seizures since   History of thyroid  cancer    s/p thyroidectomy   HTN (hypertension)    Hyperlipidemia    Hypothyroidism    Joint pain    Liver disorder    seen when she had her hysterectomy , reports she was told it was   small lesion ; but asymptomatic    Lymphedema    left arm   Migraines    Multiple food allergies    Obesity    OSA (obstructive sleep apnea) 02/16/2016   CPAP- uses nightly   Palpitations    reports no longer experiences   Personal history of chemotherapy 2015   Personal history of radiation therapy 2015   Left   Pneumonia    x 1 years ago   Pre-diabetes    Rheumatoid arthritis (HCC)    SOB (shortness of breath)    Swelling of both ankles    Tinnitus    UTI (lower urinary tract infection) 02/17/2014   Vertigo    Vitamin D  deficiency    Wears hearing aid in both ears    hearing loss   Past Surgical History:  Procedure Laterality Date   ABDOMINAL HYSTERECTOMY  2004   complete   ACHILLES TENDON REPAIR Right    APPENDECTOMY  2004   BACK SURGERY  02/09/2023   at the [facility]   bilateral eye surgery Bilateral 2022   glaucoma   bladder stimulator  08/29/2022   Medtronic bladder stinulator - with remote   BREAST EXCISIONAL BIOPSY     BREAST LUMPECTOMY Left    2015   BREAST LUMPECTOMY WITH NEEDLE LOCALIZATION AND AXILLARY SENTINEL LYMPH NODE BX Left 02/23/2014   Procedure: BREAST LUMPECTOMY WITH NEEDLE LOCALIZATION AND AXILLARY SENTINEL LYMPH NODE BIOPSY;  Surgeon: Morene Olives, MD;  Location: [facility];  Service: General;  Laterality: Left;   BREAST SURGERY  2011   left breast biposy   CARPAL TUNNEL RELEASE Bilateral    CHOLECYSTECTOMY  1990   COLONOSCOPY W/ POLYPECTOMY  06/2009   DECOMPRESSIVE LUMBAR LAMINECTOMY LEVEL 1 N/A 06/22/2023   Procedure: LUMBAR FOUR/FIVE LAMINECTOMY WITH FACET CYST EXCISION;  Surgeon: Georgina Ozell LABOR, MD;  Location: [facility];  Service:  Orthopedics;  Laterality: N/A;   EYE SURGERY Right 2013   exc. warts from underneath eyelid   INCONTINENCE SURGERY  2004   KNEE ARTHROSCOPY Bilateral    x 6 each knee   LIGAMENT REPAIR Right    thumb/wrist   ORIF TOE FRACTURE Right    great toe   PORTACATH PLACEMENT N/A 03/24/2014   Procedure: INSERTION PORT-A-CATH;  Surgeon: Morene Olives, MD;  Location: [facility];  Service: General;  Laterality: N/A;; removed    THYROIDECTOMY  2000  TONSILLECTOMY AND ADENOIDECTOMY  2000   TOTAL KNEE ARTHROPLASTY Left 12/03/2017   Procedure: LEFT TOTAL KNEE ARTHROPLASTY;  Surgeon: Melodi Lerner, MD;  Location: [facility];  Service: Orthopedics;  Laterality: Left;   TOTAL KNEE ARTHROPLASTY Right 01/18/2024   Procedure: ARTHROPLASTY, KNEE, TOTAL;  Surgeon: Vernetta Lonni GRADE, MD;  Location: [facility];  Service: Orthopedics;  Laterality: Right;   TUBAL LIGATION  1981   TUMOR EXCISION     from thoracic spine   Family History  Problem Relation Age of Onset   Alcohol abuse Mother    Arthritis Mother    Hypertension Mother    Bipolar disorder Mother    Breast cancer Mother 62       unconfirmed   Lung cancer Mother 54       smoker   Hyperlipidemia Mother    Stroke Mother    Cancer Mother    Depression Mother    Anxiety disorder Mother    Alcoholism Mother    Drug abuse Mother    Eating disorder Mother    Obesity Mother    Alcohol abuse Father    Hyperlipidemia Father    Kidney disease Father    Diabetes Father    Hypertension Father    Cystic kidney disease Father    Thyroid  disease Father    Liver disease Father    Alcoholism Father    Lung cancer Father    Thyroid  cancer Sister 44       type?; currently 16   Other Sister        ovarian tumor @ 58; TAH/BSO   Breast cancer Sister    Thyroid  cancer Brother        dx 30s; currently 44   Breast cancer Maternal Aunt        dx 65s; deceased 60   Thyroid  cancer Paternal Aunt        All 3 paternal aunts with thyroid   ca in 30s/40s   Lung cancer Paternal Aunt        2 of 3 paternal aunts with lung cancer   Arthritis Maternal Grandmother    Diabetes Paternal Grandmother    Heart disease Other    COPD Other    Asthma Other    Social History   Occupational History   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  Tobacco Use   Smoking status: Never   Smokeless tobacco: Never  Vaping Use   Vaping status: Never Used  Substance and Sexual Activity   Alcohol use: No    Alcohol/week: 0.0 standard drinks of alcohol   Drug use: No   Sexual activity: Not Currently    Partners: Male    Comment: menarche age 57, 52 2, first birth age 32, menopause age 1, Premarin  x 10 yrs   Tobacco Counseling Counseling given: Not Answered  SDOH Screenings   Food Insecurity: No Food Insecurity (05/23/2024)  Housing: Low Risk (05/23/2024)  Transportation Needs: No Transportation Needs (05/23/2024)  Utilities: Not At Risk (01/18/2024)  Alcohol Screen: Low Risk (08/23/2022)  Depression (PHQ2-9): Low Risk (05/26/2024)  Financial Resource Strain: Low Risk (05/23/2024)  Physical Activity: Sufficiently Active (05/23/2024)  Social Connections: Socially Integrated (05/23/2024)  Stress: No Stress Concern Present (05/23/2024)  Tobacco Use: Low Risk (05/26/2024)   See flowsheets for full screening details  Depression Screen PHQ 2 & 9 Depression Scale- Over the past 2 weeks, how often have you been bothered by any of the following problems? Little interest or pleasure in  doing things: 0 Feeling down, depressed, or hopeless (PHQ Adolescent also includes...irritable): 0 PHQ-2 Total Score: 0 Trouble falling or staying asleep, or sleeping too much: 0 Feeling tired or having little energy: 0 Poor appetite or overeating (PHQ Adolescent also includes...weight loss): 0 Feeling bad about yourself - or that you are a failure or have let yourself or your family down: 0 Trouble concentrating on things, such as reading the newspaper or watching  television (PHQ Adolescent also includes...like school work): 0 Moving or speaking so slowly that other people could have noticed. Or the opposite - being so fidgety or restless that you have been moving around a lot more than usual: 0 Thoughts that you would be better off dead, or of hurting yourself in some way: 0 PHQ-9 Total Score: 0 If you checked off any problems, how difficult have these problems made it for you to do your work, take care of things at home, or get along with other people?: Not difficult at all  Depression Treatment Depression Interventions/Treatment : EYV7-0 Score <4 Follow-up Not Indicated     Goals Addressed   None          Objective:    Today's Vitals   05/26/24 1056  BP: 125/62  Pulse: 72  Resp: 18  Temp: 97.7 F (36.5 C)  TempSrc: Oral  SpO2: 97%  Weight: 263 lb 3.2 oz (119.4 kg)  Height: 5' 6 (1.676 m)   Body mass index is 42.48 kg/m.  Hearing/Vision screen No results found. Immunizations and Health Maintenance Health Maintenance  Topic Date Due   Mammogram  01/24/2024   COVID-19 Vaccine (9 - Pfizer risk 2025-26 season) 09/25/2024   Medicare Annual Wellness (AWV)  05/26/2025   Fecal DNA (Cologuard)  10/26/2026   DTaP/Tdap/Td (3 - Td or Tdap) 08/12/2029   Pneumococcal Vaccine: 50+ Years  Completed   Influenza Vaccine  Completed   Bone Density Scan  Completed   Hepatitis C Screening  Completed   Zoster Vaccines- Shingrix  Completed   Meningococcal B Vaccine  Aged Out   Colonoscopy  Discontinued        Assessment/Plan:  This is a routine wellness examination for Isadore.  Patient Care Team: Daryl Setter, NP as PCP - General (Internal Medicine) Odean Potts, MD as Consulting Physician (Hematology and Oncology) Izell Domino, MD as Attending Physician (Radiation Oncology) Claudene Elsie JUDITHANN Mickey., MD (Endocrinology) Rudy Greig GAILS, OD as Consulting Physician (Optometry) Rudy, Amy V, OD (Optometry)  I have personally  reviewed and noted the following in the patients chart:   Medical and social history Use of alcohol, tobacco or illicit drugs  Current medications and supplements including opioid prescriptions. Functional ability and status Nutritional status Physical activity Advanced directives List of other physicians Hospitalizations, surgeries, and ER visits in previous 12 months Vitals Screenings to include cognitive, depression, and falls Referrals and appointments  No orders of the defined types were placed in this encounter.  In addition, I have reviewed and discussed with patient certain preventive protocols, quality metrics, and best practice recommendations. A written personalized care plan for preventive services as well as general preventive health recommendations were provided to patient.   Lolita Libra, CMA   05/26/2024   Return in 1 year (on 05/26/2025).  After Visit Summary: (In Person-Printed) AVS printed and given to the patient  Nurse Notes: HM Addressed: Mammogram and Dexa previously ordered.   "

## 2024-05-29 ENCOUNTER — Encounter: Payer: Self-pay | Admitting: Bariatrics

## 2024-05-29 ENCOUNTER — Other Ambulatory Visit: Payer: Self-pay | Admitting: Pulmonary Disease

## 2024-05-29 ENCOUNTER — Ambulatory Visit (INDEPENDENT_AMBULATORY_CARE_PROVIDER_SITE_OTHER): Admitting: Bariatrics

## 2024-05-29 VITALS — BP 116/75 | HR 72 | Temp 97.8°F | Ht 66.0 in | Wt 257.0 lb

## 2024-05-29 DIAGNOSIS — R632 Polyphagia: Secondary | ICD-10-CM

## 2024-05-29 DIAGNOSIS — E66813 Obesity, class 3: Secondary | ICD-10-CM

## 2024-05-29 DIAGNOSIS — Z6841 Body Mass Index (BMI) 40.0 and over, adult: Secondary | ICD-10-CM | POA: Diagnosis not present

## 2024-05-29 DIAGNOSIS — R7303 Prediabetes: Secondary | ICD-10-CM

## 2024-05-29 MED ORDER — PREDNISONE 20 MG PO TABS: 20.0000 mg | ORAL_TABLET | Freq: Every day | ORAL | 0 refills | Status: AC

## 2024-05-29 MED ORDER — METFORMIN HCL 500 MG PO TABS: ORAL_TABLET | ORAL | 0 refills | Status: AC

## 2024-05-29 NOTE — Progress Notes (Signed)
 "                                                                                                             WEIGHT SUMMARY AND BIOMETRICS  Weight Lost Since Last Visit: 0  Weight Gained Since Last Visit: 9 lb   Vitals Temp: 97.8 F (36.6 C) BP: 116/75 Pulse Rate: 72 SpO2: 97 %   Anthropometric Measurements Height: 5' 6 (1.676 m) Weight: 257 lb (116.6 kg) BMI (Calculated): 41.5 Weight at Last Visit: 248 lb Weight Lost Since Last Visit: 0 Weight Gained Since Last Visit: 9 lb Starting Weight: 275 lb Total Weight Loss (lbs): 18 lb (8.165 kg) Peak Weight: 310 lb   Body Composition  Body Fat %: 51.8 % Fat Mass (lbs): 133.2 lbs Muscle Mass (lbs): 117.8 lbs Total Body Water  (lbs): 93.6 lbs Visceral Fat Rating : 18   Other Clinical Data Fasting: no Labs: no Today's Visit #: 21 Starting Date: 07/28/21    OBESITY Michelle Kennedy is here to discuss her progress with her obesity treatment plan along with follow-up of her obesity related diagnoses.    Nutrition Plan: the Category 4 plan - 60% adherence.  Current exercise: stationary bicycling and knee physical therapy 20-30 minutes 7 days weekly.  Interim History:  She is up 9 lbs since her last visit.  Eating all of the food on the plan., Protein intake is as prescribed, Not journaling consistently., and Water  intake is adequate.   Pharmacotherapy: Michelle Kennedy is on Metformin  500 mg 2 in the am and 2 in the pm.  Adverse side effects: None Hunger is moderately controlled.  Cravings are moderately controlled.  Assessment/Plan:   Prediabetes Last A1c was 5.7  Medication(s):  Metformin  500 mg twice daily with meals (had been prescribed metformin  500 mg 2 in the AM and 2 in the PM but patient has been having more nausea and went back to the dose of 500 mg twice daily with meals. Lab Results  Component Value Date   HGBA1C 5.7 (H) 12/27/2023   HGBA1C 5.6 06/19/2023   HGBA1C 5.9 02/21/2023   HGBA1C 5.7 (A) 10/03/2022    HGBA1C 5.7 (H) 07/06/2022   Lab Results  Component Value Date   INSULIN  15.5 12/27/2023   INSULIN  18.0 07/06/2022   INSULIN  25.6 (H) 07/28/2021   INSULIN  21.8 09/20/2016    Plan: Will minimize all refined carbohydrates both sweets and starches.  Will work on the plan and exercise.  Consider both aerobic and resistance training.  Will keep protein, water , and fiber intake high.  Increase Polyunsaturated and Monounsaturated fats to increase satiety and encourage weight loss.  Aim for 7 to 9 hours of sleep nightly.  Continue and refill Metformin  500 mg 2 in the AM and 2 in the PM #120 with no refills.  She can adjust her medication per her nausea.  She will always take her metformin  with a meal.   Polyphagia Michelle Kennedy endorses excessive hunger.  Medication(s): Metformin  2 in the AM and 2 in the PM.  She had been  on Ozempic  in the past but her insurance will no longer provide coverage. Effects of medication:  moderately controlled. Cravings are moderately controlled.   Plan: Medication(s): Metformin  500 mg 2 in the AM and 2 in the PM and can adjust the number of tablets per her nausea level. She will have a teaspoon of apple cider vinegar starting with her evening meal, and can extend this out to every meals if tolerated. Will increase water , protein and fiber to help assuage hunger.  Will minimize foods that have a high glucose index/load to minimize reactive hypoglycemia.    Morbid Obesity: Current BMI BMI (Calculated): 41.5   Pharmacotherapy Plan Continue and refill metformin  500 mg 2 in the AM and 2 in the PM.  Michelle Kennedy is currently in the action stage of change. As such, her goal is to continue with weight loss efforts.  She has agreed to the Category 4 plan.  Exercise goals: Older adults with chronic conditions should understand whether and how their conditions affect their ability to do regular physical activity safely. She will continue to do her home exercise daily and ride  her recumbent bike.  Behavioral modification strategies: increasing lean protein intake, no meal skipping, meal planning , decrease liquid calories, increase water  intake, better snacking choices, planning for success, increasing vegetables, increasing lower sugar fruits, increasing fiber rich foods, get rid of junk food in the home, avoiding temptations, keep healthy foods in the home, weigh protein portions, measure portion sizes, and mindful eating.  Michelle Kennedy has agreed to follow-up with fasting labs in  our clinic in 6 weeks.     Objective:   VITALS: Per patient if applicable, see vitals. GENERAL: Alert and in no acute distress. CARDIOPULMONARY: No increased WOB. Speaking in clear sentences.  PSYCH: Pleasant and cooperative. Speech normal rate and rhythm. Affect is appropriate. Insight and judgement are appropriate. Attention is focused, linear, and appropriate.  NEURO: Oriented as arrived to appointment on time with no prompting.   Attestation Statements:   This was prepared with the assistance of Engineer, Civil (consulting).  Occasional wrong-word or sound-a-like substitutions may have occurred due to the inherent limitations of voice recognition .   Clayborne Daring, DO    "

## 2024-06-03 ENCOUNTER — Encounter: Payer: Self-pay | Admitting: Family

## 2024-06-07 ENCOUNTER — Other Ambulatory Visit: Payer: Self-pay | Admitting: Family

## 2024-06-10 ENCOUNTER — Ambulatory Visit: Admitting: Family Medicine

## 2024-06-12 ENCOUNTER — Encounter: Payer: Self-pay | Admitting: Family Medicine

## 2024-06-12 ENCOUNTER — Ambulatory Visit (INDEPENDENT_AMBULATORY_CARE_PROVIDER_SITE_OTHER): Admitting: Family Medicine

## 2024-06-12 ENCOUNTER — Ambulatory Visit

## 2024-06-12 VITALS — BP 122/80 | HR 80 | Ht 66.0 in | Wt 258.0 lb

## 2024-06-12 DIAGNOSIS — G8929 Other chronic pain: Secondary | ICD-10-CM

## 2024-06-12 DIAGNOSIS — M545 Low back pain, unspecified: Secondary | ICD-10-CM

## 2024-06-12 DIAGNOSIS — M25561 Pain in right knee: Secondary | ICD-10-CM

## 2024-06-12 NOTE — Patient Instructions (Addendum)
 Thank you for coming in today.   Please get an Xray today before you leave   I've referred you to Physical Therapy.  Let us  know if you don't hear from them in one week.   See you back as needed.

## 2024-06-12 NOTE — Progress Notes (Signed)
 "        I, Leotis Batter, CMA acting as a scribe for Artist Lloyd, MD.  Michelle Kennedy is a 69 y.o. female who presents to [facility] today for exacerbation of her LBP. Pt was last seen for her back on 04/16/23 her tramadol  was refilled.   Pt was unable to get back injections do to contrast allergy. Referral was placed to neurosurgery.  Today, pt reports exacerbation of LBP. This time, pain is localized, left side paraspinal. Denies Radiating pain. Taking IBU prn. Had cyst removed from right paraspinal with neurosurgery.   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  She notes she strained her right knee just a little bit recently and has a little bit of pain in the lateral quad tendon area right knee.  Dx imaging: 03/29/23 L-spine & Sacrum/SI joint MRI 01/17/23 L-spine & sacrum/coccyx XR 09/20/21 Sacrum/coccyx XR   Pertinent review of systems: No fevers or chills  Relevant historical information: Right total knee replacement   Exam:  BP 122/80   Pulse 80   Ht 5' 6 (1.676 m)   Wt 258 lb (117 kg)   SpO2 97%   BMI 41.64 kg/m  General: Well Developed, well nourished, and in no acute distress.   MSK: L-spine normal appearing. Well-appearing lumbar spine midline scar. Tender palpation left lumbar paraspinal musculature near the superior portion of the scar.  Normal lumbar motion.  Right knee well-appearing anterior scar.  Not particularly tender to palpation.  Normal motion.   Lab and Radiology Results  X-ray images lumbar spine and right knee obtained today personally and independently interpreted.  Lumbar spine: No acute fractures.  Diffuse degenerative changes.  No hardware present in the lumbar spine.  Nerve stimulator present in the pelvis right side.  Right knee: Right total knee replacement.  Well appearing hardware.  No acute fractures.  Await formal radiology review    Assessment and Plan: 69 y.o. female with chronic  low back pain and right knee pain.  Back pain due to muscle dysfunction and spasm.  There is some degenerative change component could be a factor as well.  Plan for physical therapy and if not improved MRI or CT myelogram.  Right knee pain following knee replacement.  Today's pain is most consistent with a muscle strain.  Physical therapy should be helpful for this as well.   PDMP not reviewed this encounter. Orders Placed This Encounter  Procedures   DG Lumbar Spine 2-3 Views    Standing Status:   Future    Number of Occurrences:   1    Expiration Date:   07/13/2024    Reason for Exam (SYMPTOM  OR DIAGNOSIS REQUIRED):   low back pain    Preferred imaging location?:   [facility]   DG Knee AP/LAT W/Sunrise Right    Standing Status:   Future    Number of Occurrences:   1    Expiration Date:   07/13/2024    Reason for Exam (SYMPTOM  OR DIAGNOSIS REQUIRED):   right knee pain    Preferred imaging location?:   [facility]   Ambulatory referral to Physical Therapy    Referral Priority:   Routine    Referral Type:   Physical Medicine    Referral Reason:   Specialty Services Required    Requested Specialty:   Physical Therapy    Number of Visits Requested:   1   No  orders of the defined types were placed in this encounter.    Discussed warning signs or symptoms. Please see discharge instructions. Patient expresses understanding.   The above documentation has been reviewed and is accurate and complete Artist Lloyd, M.D.   "

## 2024-06-17 ENCOUNTER — Ambulatory Visit: Payer: Self-pay | Admitting: Family Medicine

## 2024-06-17 NOTE — Progress Notes (Signed)
 Right knee x-ray shows a well looking knee replacement

## 2024-06-17 NOTE — Progress Notes (Signed)
 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

## 2024-06-18 ENCOUNTER — Other Ambulatory Visit: Payer: Self-pay | Admitting: Bariatrics

## 2024-06-24 ENCOUNTER — Ambulatory Visit (HOSPITAL_BASED_OUTPATIENT_CLINIC_OR_DEPARTMENT_OTHER)
Admission: RE | Admit: 2024-06-24 | Discharge: 2024-06-24 | Disposition: A | Source: Ambulatory Visit | Attending: Family | Admitting: Family

## 2024-06-24 DIAGNOSIS — Z780 Asymptomatic menopausal state: Secondary | ICD-10-CM

## 2024-07-14 ENCOUNTER — Ambulatory Visit: Admitting: Orthopaedic Surgery

## 2024-07-15 ENCOUNTER — Ambulatory Visit: Admitting: Orthopedic Surgery

## 2024-07-17 ENCOUNTER — Ambulatory Visit: Admitting: Bariatrics

## 2025-04-02 ENCOUNTER — Ambulatory Visit: Admitting: Internal Medicine

## 2025-05-27 ENCOUNTER — Ambulatory Visit
